# Patient Record
Sex: Female | Born: 1950
Health system: Southern US, Community
[De-identification: ages and names within clinical notes are randomized; demographics above are authoritative.]

## PROBLEM LIST (undated history)

## (undated) ENCOUNTER — Emergency Department (HOSPITAL_COMMUNITY): Admission: EM | Source: Home / Self Care

## (undated) DIAGNOSIS — I739 Peripheral vascular disease, unspecified: Secondary | ICD-10-CM

## (undated) DIAGNOSIS — K635 Polyp of colon: Secondary | ICD-10-CM

## (undated) DIAGNOSIS — F419 Anxiety disorder, unspecified: Secondary | ICD-10-CM

## (undated) DIAGNOSIS — B1920 Unspecified viral hepatitis C without hepatic coma: Secondary | ICD-10-CM

## (undated) DIAGNOSIS — E119 Type 2 diabetes mellitus without complications: Secondary | ICD-10-CM

## (undated) DIAGNOSIS — Z9889 Other specified postprocedural states: Secondary | ICD-10-CM

## (undated) DIAGNOSIS — K5790 Diverticulosis of intestine, part unspecified, without perforation or abscess without bleeding: Secondary | ICD-10-CM

## (undated) DIAGNOSIS — G479 Sleep disorder, unspecified: Secondary | ICD-10-CM

## (undated) DIAGNOSIS — J309 Allergic rhinitis, unspecified: Secondary | ICD-10-CM

## (undated) DIAGNOSIS — F1990 Other psychoactive substance use, unspecified, uncomplicated: Secondary | ICD-10-CM

## (undated) DIAGNOSIS — D509 Iron deficiency anemia, unspecified: Secondary | ICD-10-CM

## (undated) DIAGNOSIS — K219 Gastro-esophageal reflux disease without esophagitis: Secondary | ICD-10-CM

## (undated) DIAGNOSIS — I10 Essential (primary) hypertension: Secondary | ICD-10-CM

## (undated) DIAGNOSIS — Z8719 Personal history of other diseases of the digestive system: Secondary | ICD-10-CM

## (undated) DIAGNOSIS — K31819 Angiodysplasia of stomach and duodenum without bleeding: Secondary | ICD-10-CM

## (undated) DIAGNOSIS — Z8639 Personal history of other endocrine, nutritional and metabolic disease: Secondary | ICD-10-CM

## (undated) DIAGNOSIS — I639 Cerebral infarction, unspecified: Secondary | ICD-10-CM

## (undated) HISTORY — PX: EYE SURGERY: SHX253

## (undated) HISTORY — PX: TONSILLECTOMY: SUR1361

## (undated) HISTORY — PX: ABDOMINAL HYSTERECTOMY: SHX81

## (undated) HISTORY — DX: Other psychoactive substance use, unspecified, uncomplicated: F19.90

## (undated) HISTORY — DX: Angiodysplasia of stomach and duodenum without bleeding: K31.819

## (undated) HISTORY — PX: CARPAL TUNNEL RELEASE: SHX101

## (undated) HISTORY — PX: LEG AMPUTATION ABOVE KNEE: SHX117

## (undated) HISTORY — DX: Personal history of other endocrine, nutritional and metabolic disease: Z86.39

## (undated) HISTORY — DX: Other specified postprocedural states: Z98.89

## (undated) HISTORY — DX: Other specified postprocedural states: Z98.890

## (undated) HISTORY — DX: Iron deficiency anemia, unspecified: D50.9

## (undated) HISTORY — DX: Personal history of other diseases of the digestive system: Z87.19

---

## 1997-10-27 ENCOUNTER — Emergency Department (HOSPITAL_COMMUNITY): Admission: EM | Admit: 1997-10-27 | Discharge: 1997-10-27 | Payer: Self-pay | Admitting: Emergency Medicine

## 1997-12-21 ENCOUNTER — Encounter: Admission: RE | Admit: 1997-12-21 | Discharge: 1997-12-21 | Payer: Self-pay | Admitting: Obstetrics & Gynecology

## 1998-09-19 ENCOUNTER — Emergency Department (HOSPITAL_COMMUNITY): Admission: EM | Admit: 1998-09-19 | Discharge: 1998-09-19 | Payer: Self-pay | Admitting: Emergency Medicine

## 1998-09-19 ENCOUNTER — Encounter: Payer: Self-pay | Admitting: Emergency Medicine

## 1999-06-01 ENCOUNTER — Emergency Department (HOSPITAL_COMMUNITY): Admission: EM | Admit: 1999-06-01 | Discharge: 1999-06-01 | Payer: Self-pay | Admitting: Emergency Medicine

## 1999-06-01 ENCOUNTER — Encounter: Payer: Self-pay | Admitting: Emergency Medicine

## 1999-06-26 ENCOUNTER — Inpatient Hospital Stay (HOSPITAL_COMMUNITY): Admission: AD | Admit: 1999-06-26 | Discharge: 1999-06-26 | Payer: Self-pay | Admitting: Obstetrics

## 2000-03-22 ENCOUNTER — Emergency Department (HOSPITAL_COMMUNITY): Admission: EM | Admit: 2000-03-22 | Discharge: 2000-03-22 | Payer: Self-pay | Admitting: Emergency Medicine

## 2000-03-22 ENCOUNTER — Encounter: Payer: Self-pay | Admitting: Emergency Medicine

## 2000-06-04 ENCOUNTER — Encounter: Admission: RE | Admit: 2000-06-04 | Discharge: 2000-06-04 | Payer: Self-pay | Admitting: Obstetrics & Gynecology

## 2000-12-07 ENCOUNTER — Inpatient Hospital Stay (HOSPITAL_COMMUNITY): Admission: AD | Admit: 2000-12-07 | Discharge: 2000-12-07 | Payer: Self-pay | Admitting: *Deleted

## 2001-04-07 ENCOUNTER — Emergency Department (HOSPITAL_COMMUNITY): Admission: EM | Admit: 2001-04-07 | Discharge: 2001-04-07 | Payer: Self-pay | Admitting: Emergency Medicine

## 2001-05-23 ENCOUNTER — Encounter: Admission: RE | Admit: 2001-05-23 | Discharge: 2001-05-23 | Payer: Self-pay | Admitting: Obstetrics & Gynecology

## 2003-10-26 ENCOUNTER — Emergency Department (HOSPITAL_COMMUNITY): Admission: EM | Admit: 2003-10-26 | Discharge: 2003-10-26 | Payer: Self-pay

## 2004-06-20 ENCOUNTER — Emergency Department (HOSPITAL_COMMUNITY): Admission: EM | Admit: 2004-06-20 | Discharge: 2004-06-20 | Payer: Self-pay | Admitting: Emergency Medicine

## 2005-04-24 ENCOUNTER — Emergency Department (HOSPITAL_COMMUNITY): Admission: EM | Admit: 2005-04-24 | Discharge: 2005-04-24 | Payer: Self-pay | Admitting: *Deleted

## 2005-04-25 ENCOUNTER — Ambulatory Visit: Payer: Self-pay | Admitting: Internal Medicine

## 2005-04-27 ENCOUNTER — Ambulatory Visit: Payer: Self-pay | Admitting: *Deleted

## 2005-05-07 ENCOUNTER — Ambulatory Visit: Payer: Self-pay | Admitting: Internal Medicine

## 2005-06-01 ENCOUNTER — Ambulatory Visit: Payer: Self-pay | Admitting: Internal Medicine

## 2005-06-05 ENCOUNTER — Ambulatory Visit: Payer: Self-pay | Admitting: Internal Medicine

## 2005-06-07 ENCOUNTER — Ambulatory Visit: Payer: Self-pay | Admitting: Internal Medicine

## 2005-06-11 ENCOUNTER — Encounter (HOSPITAL_COMMUNITY): Admission: RE | Admit: 2005-06-11 | Discharge: 2005-09-09 | Payer: Self-pay | Admitting: Internal Medicine

## 2005-06-18 ENCOUNTER — Encounter (INDEPENDENT_AMBULATORY_CARE_PROVIDER_SITE_OTHER): Payer: Self-pay | Admitting: *Deleted

## 2005-06-18 ENCOUNTER — Ambulatory Visit (HOSPITAL_COMMUNITY): Admission: RE | Admit: 2005-06-18 | Discharge: 2005-06-18 | Payer: Self-pay | Admitting: Internal Medicine

## 2005-07-06 ENCOUNTER — Ambulatory Visit: Payer: Self-pay | Admitting: Internal Medicine

## 2005-07-09 ENCOUNTER — Ambulatory Visit: Payer: Self-pay | Admitting: Internal Medicine

## 2006-01-03 ENCOUNTER — Ambulatory Visit: Payer: Self-pay | Admitting: Internal Medicine

## 2006-01-24 ENCOUNTER — Ambulatory Visit (HOSPITAL_COMMUNITY): Admission: RE | Admit: 2006-01-24 | Discharge: 2006-01-24 | Payer: Self-pay | Admitting: Internal Medicine

## 2006-01-24 ENCOUNTER — Ambulatory Visit: Payer: Self-pay | Admitting: Internal Medicine

## 2006-01-31 ENCOUNTER — Ambulatory Visit: Payer: Self-pay | Admitting: Family Medicine

## 2006-03-07 ENCOUNTER — Ambulatory Visit: Payer: Self-pay | Admitting: Internal Medicine

## 2006-05-13 ENCOUNTER — Ambulatory Visit: Payer: Self-pay | Admitting: Internal Medicine

## 2006-06-06 ENCOUNTER — Emergency Department (HOSPITAL_COMMUNITY): Admission: EM | Admit: 2006-06-06 | Discharge: 2006-06-06 | Payer: Self-pay | Admitting: Emergency Medicine

## 2006-07-08 ENCOUNTER — Ambulatory Visit: Payer: Self-pay | Admitting: Internal Medicine

## 2006-09-02 ENCOUNTER — Ambulatory Visit: Payer: Self-pay | Admitting: Internal Medicine

## 2006-10-17 ENCOUNTER — Ambulatory Visit: Payer: Self-pay | Admitting: Internal Medicine

## 2006-11-05 ENCOUNTER — Ambulatory Visit (HOSPITAL_COMMUNITY): Admission: RE | Admit: 2006-11-05 | Discharge: 2006-11-05 | Payer: Self-pay | Admitting: Neurosurgery

## 2007-02-12 ENCOUNTER — Encounter (INDEPENDENT_AMBULATORY_CARE_PROVIDER_SITE_OTHER): Payer: Self-pay | Admitting: *Deleted

## 2007-02-13 ENCOUNTER — Ambulatory Visit: Payer: Self-pay | Admitting: Internal Medicine

## 2007-02-13 LAB — CONVERTED CEMR LAB
Alkaline Phosphatase: 67 units/L (ref 39–117)
BUN: 21 mg/dL (ref 6–23)
CO2: 22 meq/L (ref 19–32)
Creatinine, Ser: 1.06 mg/dL (ref 0.40–1.20)
Glucose, Bld: 101 mg/dL — ABNORMAL HIGH (ref 70–99)
Potassium: 3.9 meq/L (ref 3.5–5.3)
Sodium: 139 meq/L (ref 135–145)
Total Protein: 7.5 g/dL (ref 6.0–8.3)

## 2007-04-14 ENCOUNTER — Ambulatory Visit: Payer: Self-pay | Admitting: Internal Medicine

## 2007-07-31 ENCOUNTER — Ambulatory Visit: Payer: Self-pay | Admitting: Internal Medicine

## 2007-07-31 LAB — CONVERTED CEMR LAB
ALT: 30 units/L (ref 0–35)
AST: 53 units/L — ABNORMAL HIGH (ref 0–37)
Alkaline Phosphatase: 74 units/L (ref 39–117)
BUN: 12 mg/dL (ref 6–23)
Basophils Absolute: 0 10*3/uL (ref 0.0–0.1)
Basophils Relative: 0 % (ref 0–1)
CO2: 24 meq/L (ref 19–32)
Eosinophils Absolute: 0.1 10*3/uL (ref 0.0–0.7)
Eosinophils Relative: 2 % (ref 0–5)
Free T4: 1.3 ng/dL (ref 0.89–1.80)
Glucose, Bld: 106 mg/dL — ABNORMAL HIGH (ref 70–99)
HCT: 27.2 % — ABNORMAL LOW (ref 36.0–46.0)
Hemoglobin: 7.4 g/dL — CL (ref 12.0–15.0)
LDL Cholesterol: 51 mg/dL (ref 0–99)
MCV: 67.8 fL — ABNORMAL LOW (ref 78.0–100.0)
Monocytes Absolute: 0.6 10*3/uL (ref 0.1–1.0)
Monocytes Relative: 11 % (ref 3–12)
Neutro Abs: 3.4 10*3/uL (ref 1.7–7.7)
Neutrophils Relative %: 62 % (ref 43–77)
Platelets: 470 10*3/uL — ABNORMAL HIGH (ref 150–400)
Potassium: 4.3 meq/L (ref 3.5–5.3)
RBC: 4.01 M/uL (ref 3.87–5.11)
RDW: 19.1 % — ABNORMAL HIGH (ref 11.5–15.5)
Total Bilirubin: 0.5 mg/dL (ref 0.3–1.2)
Total Protein: 7.6 g/dL (ref 6.0–8.3)
VLDL: 21 mg/dL (ref 0–40)
WBC: 5.4 10*3/uL (ref 4.0–10.5)

## 2007-08-01 ENCOUNTER — Encounter: Payer: Self-pay | Admitting: Internal Medicine

## 2007-08-01 LAB — CONVERTED CEMR LAB: Saturation Ratios: 4 % — ABNORMAL LOW (ref 20–55)

## 2007-08-04 ENCOUNTER — Ambulatory Visit (HOSPITAL_COMMUNITY): Admission: RE | Admit: 2007-08-04 | Discharge: 2007-08-04 | Payer: Self-pay | Admitting: Internal Medicine

## 2007-08-21 ENCOUNTER — Ambulatory Visit: Payer: Self-pay | Admitting: Internal Medicine

## 2007-10-06 ENCOUNTER — Ambulatory Visit: Payer: Self-pay | Admitting: Internal Medicine

## 2007-10-27 ENCOUNTER — Ambulatory Visit: Payer: Self-pay | Admitting: Internal Medicine

## 2007-11-04 ENCOUNTER — Ambulatory Visit: Payer: Self-pay | Admitting: Internal Medicine

## 2007-12-11 ENCOUNTER — Ambulatory Visit: Payer: Self-pay | Admitting: Internal Medicine

## 2008-01-23 ENCOUNTER — Ambulatory Visit: Payer: Self-pay | Admitting: Internal Medicine

## 2008-01-23 LAB — CONVERTED CEMR LAB
Basophils Absolute: 0 10*3/uL (ref 0.0–0.1)
Basophils Relative: 0 % (ref 0–1)
Eosinophils Absolute: 0.1 10*3/uL (ref 0.0–0.7)
Hemoglobin: 12.8 g/dL (ref 12.0–15.0)
MCHC: 33.7 g/dL (ref 30.0–36.0)
MCV: 87.2 fL (ref 78.0–100.0)
Monocytes Absolute: 0.6 10*3/uL (ref 0.1–1.0)
Monocytes Relative: 11 % (ref 3–12)
Neutro Abs: 3.1 10*3/uL (ref 1.7–7.7)
Neutrophils Relative %: 60 % (ref 43–77)
RBC: 4.36 M/uL (ref 3.87–5.11)
RDW: 16.2 % — ABNORMAL HIGH (ref 11.5–15.5)

## 2008-03-12 ENCOUNTER — Inpatient Hospital Stay (HOSPITAL_COMMUNITY): Admission: EM | Admit: 2008-03-12 | Discharge: 2008-03-31 | Payer: Self-pay | Admitting: Emergency Medicine

## 2008-03-12 ENCOUNTER — Ambulatory Visit: Payer: Self-pay | Admitting: Pulmonary Disease

## 2008-03-12 ENCOUNTER — Encounter (INDEPENDENT_AMBULATORY_CARE_PROVIDER_SITE_OTHER): Payer: Self-pay | Admitting: General Surgery

## 2008-03-24 ENCOUNTER — Ambulatory Visit: Payer: Self-pay | Admitting: Physical Medicine & Rehabilitation

## 2008-03-31 ENCOUNTER — Inpatient Hospital Stay (HOSPITAL_COMMUNITY)
Admission: RE | Admit: 2008-03-31 | Discharge: 2008-04-09 | Payer: Self-pay | Admitting: Physical Medicine & Rehabilitation

## 2008-04-12 ENCOUNTER — Ambulatory Visit: Payer: Self-pay | Admitting: Internal Medicine

## 2008-04-27 ENCOUNTER — Encounter
Admission: RE | Admit: 2008-04-27 | Discharge: 2008-04-28 | Payer: Self-pay | Admitting: Physical Medicine & Rehabilitation

## 2008-04-28 ENCOUNTER — Encounter
Admission: RE | Admit: 2008-04-28 | Discharge: 2008-07-27 | Payer: Self-pay | Admitting: Physical Medicine & Rehabilitation

## 2008-04-28 ENCOUNTER — Ambulatory Visit: Payer: Self-pay | Admitting: Physical Medicine & Rehabilitation

## 2008-05-28 DIAGNOSIS — I639 Cerebral infarction, unspecified: Secondary | ICD-10-CM

## 2008-05-28 HISTORY — DX: Cerebral infarction, unspecified: I63.9

## 2008-06-03 ENCOUNTER — Ambulatory Visit: Payer: Self-pay | Admitting: Internal Medicine

## 2008-07-22 ENCOUNTER — Ambulatory Visit: Payer: Self-pay | Admitting: Internal Medicine

## 2008-07-22 ENCOUNTER — Encounter: Payer: Self-pay | Admitting: Internal Medicine

## 2008-07-22 DIAGNOSIS — Z9889 Other specified postprocedural states: Secondary | ICD-10-CM

## 2008-07-22 DIAGNOSIS — I10 Essential (primary) hypertension: Secondary | ICD-10-CM | POA: Insufficient documentation

## 2008-07-22 DIAGNOSIS — M726 Necrotizing fasciitis: Secondary | ICD-10-CM | POA: Insufficient documentation

## 2008-07-22 DIAGNOSIS — F322 Major depressive disorder, single episode, severe without psychotic features: Secondary | ICD-10-CM | POA: Insufficient documentation

## 2008-07-22 DIAGNOSIS — K219 Gastro-esophageal reflux disease without esophagitis: Secondary | ICD-10-CM | POA: Insufficient documentation

## 2008-07-22 HISTORY — DX: Other specified postprocedural states: Z98.89

## 2008-08-02 ENCOUNTER — Encounter
Admission: RE | Admit: 2008-08-02 | Discharge: 2008-09-14 | Payer: Self-pay | Admitting: Physical Medicine & Rehabilitation

## 2008-08-02 ENCOUNTER — Encounter
Admission: RE | Admit: 2008-08-02 | Discharge: 2008-08-02 | Payer: Self-pay | Admitting: Physical Medicine & Rehabilitation

## 2008-08-03 ENCOUNTER — Ambulatory Visit: Payer: Self-pay | Admitting: Physical Medicine & Rehabilitation

## 2008-08-03 ENCOUNTER — Encounter
Admission: RE | Admit: 2008-08-03 | Discharge: 2008-11-01 | Payer: Self-pay | Admitting: Physical Medicine & Rehabilitation

## 2008-08-19 ENCOUNTER — Encounter: Payer: Self-pay | Admitting: Internal Medicine

## 2008-08-19 ENCOUNTER — Ambulatory Visit: Payer: Self-pay | Admitting: Internal Medicine

## 2008-08-19 LAB — CONVERTED CEMR LAB
ALT: 16 units/L (ref 0–35)
AST: 27 units/L (ref 0–37)
Alkaline Phosphatase: 75 units/L (ref 39–117)
BUN: 17 mg/dL (ref 6–23)
Chloride: 105 meq/L (ref 96–112)
Creatinine, Ser: 0.87 mg/dL (ref 0.40–1.20)

## 2008-09-14 ENCOUNTER — Ambulatory Visit: Payer: Self-pay | Admitting: Physical Medicine & Rehabilitation

## 2008-09-23 ENCOUNTER — Encounter: Payer: Self-pay | Admitting: Internal Medicine

## 2008-09-23 ENCOUNTER — Ambulatory Visit: Payer: Self-pay | Admitting: Internal Medicine

## 2008-10-07 ENCOUNTER — Telehealth: Payer: Self-pay | Admitting: Internal Medicine

## 2008-11-02 ENCOUNTER — Encounter
Admission: RE | Admit: 2008-11-02 | Discharge: 2009-01-31 | Payer: Self-pay | Admitting: Physical Medicine & Rehabilitation

## 2008-11-04 ENCOUNTER — Encounter
Admission: RE | Admit: 2008-11-04 | Discharge: 2008-11-09 | Payer: Self-pay | Admitting: Physical Medicine & Rehabilitation

## 2008-11-09 ENCOUNTER — Ambulatory Visit: Payer: Self-pay | Admitting: Physical Medicine & Rehabilitation

## 2008-11-11 ENCOUNTER — Ambulatory Visit: Payer: Self-pay | Admitting: Internal Medicine

## 2008-11-11 ENCOUNTER — Encounter: Payer: Self-pay | Admitting: Internal Medicine

## 2008-11-11 LAB — CONVERTED CEMR LAB
Albumin: 3.7 g/dL (ref 3.5–5.2)
Alkaline Phosphatase: 85 units/L (ref 39–117)
BUN: 11 mg/dL (ref 6–23)
Basophils Absolute: 0 10*3/uL (ref 0.0–0.1)
Basophils Relative: 0 % (ref 0–1)
Creatinine, Ser: 0.74 mg/dL (ref 0.40–1.20)
Eosinophils Absolute: 0.1 10*3/uL (ref 0.0–0.7)
Eosinophils Relative: 2 % (ref 0–5)
Glucose, Bld: 89 mg/dL (ref 70–99)
HCT: 38.6 % (ref 36.0–46.0)
HDL: 38 mg/dL — ABNORMAL LOW (ref >39)
Hemoglobin: 13 g/dL (ref 12.0–15.0)
LDL Cholesterol: 91 mg/dL (ref 0–99)
Lymphocytes Relative: 46 % (ref 12–46)
MCHC: 33.7 g/dL (ref 30.0–36.0)
MCV: 92.3 fL (ref 78.0–100.0)
Monocytes Absolute: 0.5 10*3/uL (ref 0.1–1.0)
Platelets: 264 10*3/uL (ref 150–400)
Potassium: 3.9 meq/L (ref 3.5–5.3)
RDW: 13.9 % (ref 11.5–15.5)
Total CHOL/HDL Ratio: 4.5
Triglycerides: 205 mg/dL — ABNORMAL HIGH (ref <150)

## 2008-12-15 ENCOUNTER — Telehealth: Payer: Self-pay | Admitting: Internal Medicine

## 2008-12-23 ENCOUNTER — Encounter: Payer: Self-pay | Admitting: Internal Medicine

## 2008-12-23 ENCOUNTER — Ambulatory Visit: Payer: Self-pay | Admitting: Internal Medicine

## 2009-01-27 ENCOUNTER — Encounter
Admission: RE | Admit: 2009-01-27 | Discharge: 2009-02-01 | Payer: Self-pay | Admitting: Physical Medicine & Rehabilitation

## 2009-02-01 ENCOUNTER — Ambulatory Visit: Payer: Self-pay | Admitting: Physical Medicine & Rehabilitation

## 2009-02-03 ENCOUNTER — Encounter: Payer: Self-pay | Admitting: Internal Medicine

## 2009-02-03 ENCOUNTER — Ambulatory Visit: Payer: Self-pay | Admitting: Internal Medicine

## 2009-02-24 ENCOUNTER — Emergency Department (HOSPITAL_COMMUNITY): Admission: EM | Admit: 2009-02-24 | Discharge: 2009-02-24 | Payer: Self-pay | Admitting: Emergency Medicine

## 2009-02-28 ENCOUNTER — Ambulatory Visit: Payer: Self-pay | Admitting: Internal Medicine

## 2009-02-28 ENCOUNTER — Ambulatory Visit (HOSPITAL_COMMUNITY): Admission: RE | Admit: 2009-02-28 | Discharge: 2009-02-28 | Payer: Self-pay | Admitting: Internal Medicine

## 2009-03-14 ENCOUNTER — Telehealth: Payer: Self-pay | Admitting: Internal Medicine

## 2009-03-24 ENCOUNTER — Ambulatory Visit: Payer: Self-pay | Admitting: Internal Medicine

## 2009-03-29 ENCOUNTER — Encounter
Admission: RE | Admit: 2009-03-29 | Discharge: 2009-05-25 | Payer: Self-pay | Admitting: Physical Medicine & Rehabilitation

## 2009-03-29 ENCOUNTER — Ambulatory Visit: Payer: Self-pay | Admitting: Physical Medicine & Rehabilitation

## 2009-04-29 ENCOUNTER — Encounter: Payer: Self-pay | Admitting: Internal Medicine

## 2009-05-02 ENCOUNTER — Telehealth: Payer: Self-pay | Admitting: Internal Medicine

## 2009-05-25 ENCOUNTER — Encounter: Admission: RE | Admit: 2009-05-25 | Discharge: 2009-05-30 | Payer: Self-pay | Admitting: Orthopedic Surgery

## 2009-05-30 ENCOUNTER — Encounter: Admission: RE | Admit: 2009-05-30 | Discharge: 2009-08-28 | Payer: Self-pay | Admitting: Orthopedic Surgery

## 2009-05-31 ENCOUNTER — Ambulatory Visit: Payer: Self-pay | Admitting: Family Medicine

## 2009-06-20 ENCOUNTER — Encounter
Admission: RE | Admit: 2009-06-20 | Discharge: 2009-09-18 | Payer: Self-pay | Admitting: Physical Medicine & Rehabilitation

## 2009-06-23 ENCOUNTER — Ambulatory Visit: Payer: Self-pay | Admitting: Family Medicine

## 2009-06-23 ENCOUNTER — Ambulatory Visit: Payer: Self-pay | Admitting: Internal Medicine

## 2009-07-27 ENCOUNTER — Ambulatory Visit: Payer: Self-pay | Admitting: Physical Medicine & Rehabilitation

## 2009-08-09 ENCOUNTER — Ambulatory Visit: Payer: Self-pay | Admitting: Internal Medicine

## 2009-09-13 ENCOUNTER — Ambulatory Visit: Payer: Self-pay | Admitting: Internal Medicine

## 2009-12-13 ENCOUNTER — Ambulatory Visit: Payer: Self-pay | Admitting: Internal Medicine

## 2010-02-21 ENCOUNTER — Inpatient Hospital Stay (HOSPITAL_COMMUNITY): Admission: EM | Admit: 2010-02-21 | Discharge: 2010-02-28 | Payer: Self-pay | Admitting: Emergency Medicine

## 2010-02-21 ENCOUNTER — Ambulatory Visit: Payer: Self-pay | Admitting: Cardiology

## 2010-02-22 ENCOUNTER — Encounter (INDEPENDENT_AMBULATORY_CARE_PROVIDER_SITE_OTHER): Payer: Self-pay | Admitting: Internal Medicine

## 2010-03-06 ENCOUNTER — Ambulatory Visit: Payer: Self-pay | Admitting: Hematology & Oncology

## 2010-04-12 ENCOUNTER — Encounter (INDEPENDENT_AMBULATORY_CARE_PROVIDER_SITE_OTHER): Payer: Self-pay | Admitting: *Deleted

## 2010-04-12 LAB — CONVERTED CEMR LAB
ALT: 36 units/L — ABNORMAL HIGH (ref 0–35)
Alkaline Phosphatase: 101 units/L (ref 39–117)
Basophils Absolute: 0 10*3/uL (ref 0.0–0.1)
Eosinophils Absolute: 0.1 10*3/uL (ref 0.0–0.7)
Eosinophils Relative: 1 % (ref 0–5)
HCT: 35.3 % — ABNORMAL LOW (ref 36.0–46.0)
Lymphocytes Relative: 23 % (ref 12–46)
MCV: 91 fL (ref 78.0–100.0)
Neutrophils Relative %: 62 % (ref 43–77)
Platelets: 281 10*3/uL (ref 150–400)
RDW: 15.9 % — ABNORMAL HIGH (ref 11.5–15.5)
Sodium: 141 meq/L (ref 135–145)
Total Bilirubin: 0.3 mg/dL (ref 0.3–1.2)
Total Protein: 6.7 g/dL (ref 6.0–8.3)

## 2010-04-14 ENCOUNTER — Ambulatory Visit: Payer: Self-pay | Admitting: Oncology

## 2010-05-08 LAB — CBC & DIFF AND RETIC
Basophils Absolute: 0 10*3/uL (ref 0.0–0.1)
EOS%: 1 % (ref 0.0–7.0)
HCT: 29.6 % — ABNORMAL LOW (ref 34.8–46.6)
HGB: 9.3 g/dL — ABNORMAL LOW (ref 11.6–15.9)
MCH: 27.4 pg (ref 25.1–34.0)
MCV: 87.1 fL (ref 79.5–101.0)
NEUT%: 61.1 % (ref 38.4–76.8)
lymph#: 1.5 10*3/uL (ref 0.9–3.3)

## 2010-05-08 LAB — MORPHOLOGY

## 2010-05-08 LAB — RETICULOCYTES (CHCC): Retic Ct Pct: 3 % (ref 0.4–3.1)

## 2010-05-09 LAB — TRANSFERRIN RECEPTOR, SOLUABLE: Transferrin Receptor, Soluble: 51.8 nmol/L

## 2010-05-09 LAB — COMPREHENSIVE METABOLIC PANEL
ALT: 22 U/L (ref 0–35)
AST: 42 U/L — ABNORMAL HIGH (ref 0–37)
Alkaline Phosphatase: 92 U/L (ref 39–117)
BUN: 14 mg/dL (ref 6–23)
CO2: 27 mEq/L (ref 19–32)
Creatinine, Ser: 0.66 mg/dL (ref 0.40–1.20)
Glucose, Bld: 74 mg/dL (ref 70–99)
Sodium: 140 mEq/L (ref 135–145)

## 2010-05-09 LAB — IRON AND TIBC
%SAT: 5 % — ABNORMAL LOW (ref 20–55)
Iron: 20 ug/dL — ABNORMAL LOW (ref 42–145)
UIBC: 397 ug/dL

## 2010-05-17 ENCOUNTER — Ambulatory Visit: Payer: Self-pay | Admitting: Oncology

## 2010-06-09 LAB — CBC WITH DIFFERENTIAL/PLATELET
BASO%: 0.5 % (ref 0.0–2.0)
Basophils Absolute: 0 10*3/uL (ref 0.0–0.1)
EOS%: 1 % (ref 0.0–7.0)
Eosinophils Absolute: 0.1 10*3/uL (ref 0.0–0.5)
HCT: 39.2 % (ref 34.8–46.6)
HGB: 12.9 g/dL (ref 11.6–15.9)
LYMPH%: 23.6 % (ref 14.0–49.7)
MCH: 29.4 pg (ref 25.1–34.0)
MCHC: 32.9 g/dL (ref 31.5–36.0)
MCV: 89.3 fL (ref 79.5–101.0)
MONO#: 0.8 10*3/uL (ref 0.1–0.9)
MONO%: 13.3 % (ref 0.0–14.0)
NEUT#: 3.9 10*3/uL (ref 1.5–6.5)
NEUT%: 61.6 % (ref 38.4–76.8)
Platelets: 321 10*3/uL (ref 145–400)
RBC: 4.39 10*6/uL (ref 3.70–5.45)
RDW: 18.2 % — ABNORMAL HIGH (ref 11.2–14.5)
WBC: 6.3 10*3/uL (ref 3.9–10.3)
lymph#: 1.5 10*3/uL (ref 0.9–3.3)

## 2010-06-09 LAB — COMPREHENSIVE METABOLIC PANEL
ALT: 42 U/L — ABNORMAL HIGH (ref 0–35)
AST: 58 U/L — ABNORMAL HIGH (ref 0–37)
Albumin: 3.6 g/dL (ref 3.5–5.2)
Alkaline Phosphatase: 89 U/L (ref 39–117)
BUN: 11 mg/dL (ref 6–23)
CO2: 31 mEq/L (ref 19–32)
Calcium: 9.1 mg/dL (ref 8.4–10.5)
Chloride: 99 mEq/L (ref 96–112)
Creatinine, Ser: 0.75 mg/dL (ref 0.40–1.20)
Glucose, Bld: 91 mg/dL (ref 70–99)
Potassium: 3.9 mEq/L (ref 3.5–5.3)
Sodium: 139 mEq/L (ref 135–145)
Total Bilirubin: 0.4 mg/dL (ref 0.3–1.2)
Total Protein: 7.3 g/dL (ref 6.0–8.3)

## 2010-06-09 LAB — LACTATE DEHYDROGENASE: LDH: 135 U/L (ref 94–250)

## 2010-06-10 LAB — SEDIMENTATION RATE: Sed Rate: 4 mm/hr (ref 0–22)

## 2010-06-10 LAB — IRON AND TIBC
%SAT: 21 % (ref 20–55)
Iron: 73 ug/dL (ref 42–145)
TIBC: 351 ug/dL (ref 250–470)
UIBC: 278 ug/dL

## 2010-06-10 LAB — FERRITIN: Ferritin: 150 ng/mL (ref 10–291)

## 2010-06-10 LAB — TRANSFERRIN RECEPTOR, SOLUABLE: Transferrin Receptor, Soluble: 19.1 nmol/L

## 2010-06-20 ENCOUNTER — Encounter (INDEPENDENT_AMBULATORY_CARE_PROVIDER_SITE_OTHER): Payer: Self-pay | Admitting: *Deleted

## 2010-06-20 LAB — CONVERTED CEMR LAB
ALT: 37 units/L — ABNORMAL HIGH (ref 0–35)
AST: 59 units/L — ABNORMAL HIGH (ref 0–37)
Albumin: 4.3 g/dL (ref 3.5–5.2)
Benzodiazepines.: NEGATIVE
Methadone: NEGATIVE
Propoxyphene: NEGATIVE
Total Protein: 7.7 g/dL (ref 6.0–8.3)

## 2010-06-22 ENCOUNTER — Encounter: Payer: Self-pay | Admitting: Internal Medicine

## 2010-07-13 ENCOUNTER — Encounter (HOSPITAL_BASED_OUTPATIENT_CLINIC_OR_DEPARTMENT_OTHER): Payer: Medicare Other | Admitting: Oncology

## 2010-07-13 ENCOUNTER — Other Ambulatory Visit (HOSPITAL_COMMUNITY): Payer: Self-pay | Admitting: Oncology

## 2010-07-13 DIAGNOSIS — D500 Iron deficiency anemia secondary to blood loss (chronic): Secondary | ICD-10-CM

## 2010-07-13 LAB — CBC WITH DIFFERENTIAL/PLATELET
Eosinophils Absolute: 0.1 10*3/uL (ref 0.0–0.5)
MCV: 89.8 fL (ref 79.5–101.0)
MONO%: 15 % — ABNORMAL HIGH (ref 0.0–14.0)
NEUT#: 3.4 10*3/uL (ref 1.5–6.5)
RBC: 4.29 10*6/uL (ref 3.70–5.45)
RDW: 16.1 % — ABNORMAL HIGH (ref 11.2–14.5)
WBC: 5.9 10*3/uL (ref 3.9–10.3)

## 2010-07-13 LAB — FERRITIN: Ferritin: 92 ng/mL (ref 10–291)

## 2010-08-08 ENCOUNTER — Emergency Department (HOSPITAL_COMMUNITY): Payer: Medicare Other

## 2010-08-08 ENCOUNTER — Emergency Department (HOSPITAL_COMMUNITY)
Admission: EM | Admit: 2010-08-08 | Discharge: 2010-08-08 | Disposition: A | Discharge status: Home / Self Care | Payer: Medicare Other | Attending: Emergency Medicine | Admitting: Emergency Medicine

## 2010-08-08 DIAGNOSIS — E119 Type 2 diabetes mellitus without complications: Secondary | ICD-10-CM | POA: Insufficient documentation

## 2010-08-08 DIAGNOSIS — J189 Pneumonia, unspecified organism: Secondary | ICD-10-CM | POA: Insufficient documentation

## 2010-08-08 DIAGNOSIS — R059 Cough, unspecified: Secondary | ICD-10-CM | POA: Insufficient documentation

## 2010-08-08 DIAGNOSIS — I10 Essential (primary) hypertension: Secondary | ICD-10-CM | POA: Insufficient documentation

## 2010-08-08 DIAGNOSIS — R5381 Other malaise: Secondary | ICD-10-CM | POA: Insufficient documentation

## 2010-08-08 DIAGNOSIS — R05 Cough: Secondary | ICD-10-CM | POA: Insufficient documentation

## 2010-08-08 LAB — GLUCOSE, CAPILLARY: Glucose-Capillary: 96 mg/dL (ref 70–99)

## 2010-08-10 LAB — VITAMIN B12: Vitamin B-12: 734 pg/mL (ref 211–911)

## 2010-08-10 LAB — COMPREHENSIVE METABOLIC PANEL
ALT: 17 U/L (ref 0–35)
ALT: 20 U/L (ref 0–35)
Alkaline Phosphatase: 77 U/L (ref 39–117)
Alkaline Phosphatase: 83 U/L (ref 39–117)
CO2: 26 mEq/L (ref 19–32)
CO2: 26 mEq/L (ref 19–32)
Calcium: 8.5 mg/dL (ref 8.4–10.5)
Chloride: 105 mEq/L (ref 96–112)
GFR calc non Af Amer: >60 mL/min (ref >60)
GFR calc non Af Amer: >60 mL/min (ref >60)
Glucose, Bld: 118 mg/dL — ABNORMAL HIGH (ref 70–99)
Glucose, Bld: 86 mg/dL (ref 70–99)
Potassium: 2.9 mEq/L — ABNORMAL LOW (ref 3.5–5.1)
Potassium: 3.1 mEq/L — ABNORMAL LOW (ref 3.5–5.1)
Sodium: 138 mEq/L (ref 135–145)
Sodium: 141 mEq/L (ref 135–145)
Total Bilirubin: 0.3 mg/dL (ref 0.3–1.2)

## 2010-08-10 LAB — URINALYSIS, ROUTINE W REFLEX MICROSCOPIC
Glucose, UA: NEGATIVE mg/dL
Ketones, ur: NEGATIVE mg/dL
Nitrite: NEGATIVE
Protein, ur: NEGATIVE mg/dL

## 2010-08-10 LAB — CROSSMATCH

## 2010-08-10 LAB — CBC
HCT: 16.3 % — ABNORMAL LOW (ref 36.0–46.0)
HCT: 18.1 % — ABNORMAL LOW (ref 36.0–46.0)
HCT: 19.9 % — ABNORMAL LOW (ref 36.0–46.0)
HCT: 34.5 % — ABNORMAL LOW (ref 36.0–46.0)
Hemoglobin: 10.6 g/dL — ABNORMAL LOW (ref 12.0–15.0)
Hemoglobin: 4 g/dL — CL (ref 12.0–15.0)
Hemoglobin: 4.4 g/dL — CL (ref 12.0–15.0)
Hemoglobin: 5.3 g/dL — CL (ref 12.0–15.0)
MCH: 14.2 pg — ABNORMAL LOW (ref 26.0–34.0)
MCH: 20.9 pg — ABNORMAL LOW (ref 26.0–34.0)
MCHC: 24.3 g/dL — ABNORMAL LOW (ref 30.0–36.0)
MCHC: 24.5 g/dL — ABNORMAL LOW (ref 30.0–36.0)
MCHC: 26.6 g/dL — ABNORMAL LOW (ref 30.0–36.0)
MCHC: 27.5 g/dL — ABNORMAL LOW (ref 30.0–36.0)
MCV: 71.7 fL — ABNORMAL LOW (ref 78.0–100.0)
Platelets: 531 10*3/uL — ABNORMAL HIGH (ref 150–400)
Platelets: 568 10*3/uL — ABNORMAL HIGH (ref 150–400)
RBC: 3.1 MIL/uL — ABNORMAL LOW (ref 3.87–5.11)
RBC: 5.06 MIL/uL (ref 3.87–5.11)
RBC: 5.15 MIL/uL — ABNORMAL HIGH (ref 3.87–5.11)
RDW: 22.1 % — ABNORMAL HIGH (ref 11.5–15.5)
RDW: 24.4 % — ABNORMAL HIGH (ref 11.5–15.5)
RDW: 26 % — ABNORMAL HIGH (ref 11.5–15.5)
WBC: 6.4 10*3/uL (ref 4.0–10.5)
WBC: 7.7 10*3/uL (ref 4.0–10.5)
WBC: 8.8 10*3/uL (ref 4.0–10.5)

## 2010-08-10 LAB — CK TOTAL AND CKMB (NOT AT ARMC): CK, MB: 0.4 ng/mL (ref 0.3–4.0)

## 2010-08-10 LAB — CARDIAC PANEL(CRET KIN+CKTOT+MB+TROPI)
CK, MB: 0.5 ng/mL (ref 0.3–4.0)
Total CK: 33 U/L (ref 7–177)
Total CK: 40 U/L (ref 7–177)
Troponin I: 0.01 ng/mL (ref 0.00–0.06)

## 2010-08-10 LAB — POCT CARDIAC MARKERS: Myoglobin, poc: 22.4 ng/mL (ref 12–200)

## 2010-08-10 LAB — BASIC METABOLIC PANEL
BUN: 5 mg/dL — ABNORMAL LOW (ref 6–23)
BUN: 5 mg/dL — ABNORMAL LOW (ref 6–23)
Calcium: 9 mg/dL (ref 8.4–10.5)
Chloride: 100 mEq/L (ref 96–112)
Chloride: 99 mEq/L (ref 96–112)
Creatinine, Ser: 0.73 mg/dL (ref 0.4–1.2)
Creatinine, Ser: 0.93 mg/dL (ref 0.4–1.2)
GFR calc Af Amer: >60 mL/min (ref >60)
GFR calc Af Amer: >60 mL/min (ref >60)
GFR calc non Af Amer: >60 mL/min (ref >60)
Potassium: 4 mEq/L (ref 3.5–5.1)

## 2010-08-10 LAB — GLUCOSE, CAPILLARY
Glucose-Capillary: 100 mg/dL — ABNORMAL HIGH (ref 70–99)
Glucose-Capillary: 102 mg/dL — ABNORMAL HIGH (ref 70–99)
Glucose-Capillary: 107 mg/dL — ABNORMAL HIGH (ref 70–99)
Glucose-Capillary: 109 mg/dL — ABNORMAL HIGH (ref 70–99)
Glucose-Capillary: 109 mg/dL — ABNORMAL HIGH (ref 70–99)
Glucose-Capillary: 116 mg/dL — ABNORMAL HIGH (ref 70–99)
Glucose-Capillary: 139 mg/dL — ABNORMAL HIGH (ref 70–99)
Glucose-Capillary: 87 mg/dL (ref 70–99)
Glucose-Capillary: 89 mg/dL (ref 70–99)
Glucose-Capillary: 91 mg/dL (ref 70–99)
Glucose-Capillary: 94 mg/dL (ref 70–99)
Glucose-Capillary: 97 mg/dL (ref 70–99)
Glucose-Capillary: 97 mg/dL (ref 70–99)
Glucose-Capillary: 98 mg/dL (ref 70–99)
Glucose-Capillary: 99 mg/dL (ref 70–99)

## 2010-08-10 LAB — PROTIME-INR: INR: 1.03 (ref 0.00–1.49)

## 2010-08-10 LAB — RETICULOCYTES
RBC.: 3.16 MIL/uL — ABNORMAL LOW (ref 3.87–5.11)
Retic Count, Absolute: 34.8 10*3/uL (ref 19.0–186.0)
Retic Ct Pct: 1.1 % (ref 0.4–3.1)

## 2010-08-10 LAB — IRON AND TIBC
Iron: 26 ug/dL — ABNORMAL LOW (ref 42–135)
UIBC: >450 ug/dL

## 2010-08-10 LAB — DIFFERENTIAL
Basophils Absolute: 0 10*3/uL (ref 0.0–0.1)
Basophils Relative: 1 % (ref 0–1)
Eosinophils Absolute: 0 10*3/uL (ref 0.0–0.7)
Eosinophils Relative: 1 % (ref 0–5)
Neutrophils Relative %: 65 % (ref 43–77)

## 2010-08-10 LAB — LIPID PANEL
LDL Cholesterol: 35 mg/dL (ref 0–99)
VLDL: 18 mg/dL (ref 0–40)

## 2010-09-01 LAB — URINALYSIS, ROUTINE W REFLEX MICROSCOPIC
Bilirubin Urine: NEGATIVE
Glucose, UA: NEGATIVE mg/dL
Hgb urine dipstick: NEGATIVE
Ketones, ur: NEGATIVE mg/dL
Protein, ur: NEGATIVE mg/dL
pH: 5.5 (ref 5.0–8.0)

## 2010-09-01 LAB — DIFFERENTIAL
Basophils Absolute: 0 10*3/uL (ref 0.0–0.1)
Eosinophils Relative: 3 % (ref 0–5)
Lymphocytes Relative: 42 % (ref 12–46)
Lymphs Abs: 1.9 10*3/uL (ref 0.7–4.0)
Monocytes Absolute: 0.7 10*3/uL (ref 0.1–1.0)
Neutro Abs: 1.8 10*3/uL (ref 1.7–7.7)

## 2010-09-01 LAB — CBC
HCT: 40.2 % (ref 36.0–46.0)
Hemoglobin: 13.6 g/dL (ref 12.0–15.0)
WBC: 4.5 10*3/uL (ref 4.0–10.5)

## 2010-09-01 LAB — URINE MICROSCOPIC-ADD ON

## 2010-09-01 LAB — GLUCOSE, CAPILLARY: Glucose-Capillary: 82 mg/dL (ref 70–99)

## 2010-09-01 LAB — POCT I-STAT, CHEM 8
BUN: 11 mg/dL (ref 6–23)
Calcium, Ion: 1.18 mmol/L (ref 1.12–1.32)
Chloride: 101 mEq/L (ref 96–112)
HCT: 43 % (ref 36.0–46.0)
Potassium: 4 mEq/L (ref 3.5–5.1)

## 2010-09-11 ENCOUNTER — Other Ambulatory Visit (HOSPITAL_COMMUNITY): Payer: Self-pay | Admitting: Oncology

## 2010-09-11 ENCOUNTER — Encounter (HOSPITAL_BASED_OUTPATIENT_CLINIC_OR_DEPARTMENT_OTHER): Payer: Medicare Other | Admitting: Oncology

## 2010-09-11 DIAGNOSIS — D509 Iron deficiency anemia, unspecified: Secondary | ICD-10-CM

## 2010-09-11 LAB — CBC WITH DIFFERENTIAL/PLATELET
Basophils Absolute: 0 10*3/uL (ref 0.0–0.1)
Eosinophils Absolute: 0.1 10*3/uL (ref 0.0–0.5)
HCT: 35 % (ref 34.8–46.6)
HGB: 11.6 g/dL (ref 11.6–15.9)
LYMPH%: 29.2 % (ref 14.0–49.7)
MCHC: 33.1 g/dL (ref 31.5–36.0)
MONO#: 0.8 10*3/uL (ref 0.1–0.9)
NEUT%: 55.4 % (ref 38.4–76.8)
Platelets: 343 10*3/uL (ref 145–400)
WBC: 5.9 10*3/uL (ref 3.9–10.3)
lymph#: 1.7 10*3/uL (ref 0.9–3.3)

## 2010-09-14 ENCOUNTER — Encounter (HOSPITAL_BASED_OUTPATIENT_CLINIC_OR_DEPARTMENT_OTHER): Payer: Medicare Other | Admitting: Oncology

## 2010-09-14 DIAGNOSIS — D509 Iron deficiency anemia, unspecified: Secondary | ICD-10-CM

## 2010-09-21 ENCOUNTER — Encounter (HOSPITAL_BASED_OUTPATIENT_CLINIC_OR_DEPARTMENT_OTHER): Payer: Medicare Other | Admitting: Oncology

## 2010-09-21 DIAGNOSIS — D500 Iron deficiency anemia secondary to blood loss (chronic): Secondary | ICD-10-CM

## 2010-10-10 NOTE — Discharge Summary (Signed)
NAMEMarland Kitchen  [name], [name] NO.:  [id number]   MEDICAL RECORD NO.:  [id number]          PATIENT TYPE:  IPS   LOCATION:  4030                         FACILITY:  [facility]   PHYSICIAN:  Ellwood Dense, M.D.   DATE OF BIRTH:  [birth year]   DATE OF ADMISSION:  03/31/2008  DATE OF DISCHARGE:  04/09/2008                               DISCHARGE SUMMARY   DISCHARGE DIAGNOSES:  1. Necrotizing fasciitis requiring left above knee amputation on      October 27.  2. Glucose intolerance.  3. Hypertension.  4. Left lower lobe pneumonia treated.  5. Clostridium difficile colitis.  6. Hypokalemia.  7. Elevated anxiety level with adjustment reaction.   HISTORY OF PRESENT ILLNESS:  [name] is a 60 year old female with  history of hypertension, bunionectomy occurring once who was admitted  October 16 with pain and blisters left lower extremity secondary to  necrotizing fasciitis and sepsis.  She underwent emergent I and D with  left below knee amputation same day by Dr. Janee [name].  Postop was managed  on when due to need for repeat surgery.  She was placed on IV  antibiotic.  Repeat I and D was done on October 17.  Dr. Noelle [name] and Dr.  Carola [name] were consulted on input regarding wound closure and recommended an  above knee amputation with __________ for healing of wound.  The patient  underwent left above knee amputation on October 27 by Dr. Carola [name].  Postop  on subcu Lovenox for DVT prophylaxis and Coumadin was added  additionally.  She has required multiple units packed red blood cells  for acute blood loss anemia.  Has been maintained on IV Zosyn for  question of left lower lobe pneumonia.  Chest x-ray done today shows  partial resolution of left basal pleural parenchymal density.  Therapies  are ongoing and the patient is noted to have improvement in mobility,  does tend to be easily distracted requiring extra time and redirection.  Rehab consulted for further therapies.   PAST MEDICAL  HISTORY:  Significant for hepatitis C, hysterectomy,  hypertension and questionable history of diabetes.   ALLERGIES:  Are to PENICILLIN, CODEINE, is able to tolerate Zosyn.   FAMILY HISTORY:  Is positive for coronary artery disease.   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  Smokes 1/2  pack per day tobacco.  Uses alcohol rarely.   FUNCTIONAL HISTORY:  The patient was independent prior to admission.  Does have an nephew who lives with her who does her driving for her.   PHYSICAL EXAM:  GENERAL:  The patient is an obese female, alert and  oriented x3, verbose, needs some redirection.  HEENT:  Atraumatic, normocephalic with extraocular movements intact.  Nares patent.  Oral mucosa moist with multiple missing teeth.  NECK:  Neck is supple without masses or JVD.  LUNGS:  Lungs are clear to auscultation bilaterally.  HEART:  Shows regular rate and rhythm.  ABDOMEN:  Is soft, nontender with positive bowel sounds.  EXTREMITIES:  Show left above knee amputation stump with multiple large  fluctuant blisters.  Staples intact with moderate serosanguineous  drainage on dressing anterior aspect of thigh with large abraded  appearing area probably graft area.  Bilateral upper extremity and right  lower extremity without any breakdowns.  NEUROLOGICAL:  Alert and oriented x3.  Follow basic commands without  difficulty.  Judgment, memory intact.  She is verbose.   HOSPITAL COURSE:  [name] was admitted to rehab on March 31, 2008, for inpatient therapies to consist of PT, OT at least 3 hours 5  days a week.  Past admission, physiatry, 24 hour rehab RN and therapy  team have worked together to provide customized collaborative  interdisciplinary care during her stay.  Team conferences were held on  weekly basis to assess the patient's progress goal as well as discuss  barriers to discharge.  The  patient was noted to have high levels of  anxiety with easy distractibility as well as variable mood that was  initially noted to be as a barrier to discharge.  She has a supportive  family that will be able to assist post discharge.  During her stay in  rehab routine check labs were done with close monitoring of  electrolytes.  Labs of November 5 revealed H and H at 12.6 and 37.0,  white count normal at 8.5, platelets 780.  Check of lytes showed sodium  140, potassium 3.6, chloride 108, CO2 24, BUN 6, creatinine 0.95,  glucose 109.  The patient's pain was managed with Neurontin 100 mg  t.i.d. and Lyrica 50 mg b.i.d.  Vicodin has been used on p.r.n. basis  for pain management.  Initially due to excessive drainage dressing  changes were done on q.i.d. basis.  _________ and zinc sulfate were  added b.i.d. to promote wound healing.  The patient's wound has been  monitored along daily by rehab RN with dressing changes.  By time of  discharge drainage is almost resolved with occasional bloody drainage  from lower aspect of wound.  Currently her blisters have resolved.  Wound remained intact with sutures in place on amputation site.  Lower  graft site shows some granulation tissue.  Home health nurse has been  arranged for daily dressing changes past discharge.  The patient's blood  pressures have been monitored on b.i.d. basis.  This was noted to be  elevated.  Therefore, the patient's Prinivil was resumed.  At time of  discharge blood pressures continued to ranged from 140s to 90s systolic.  She is to follow up with LMD for further adjustments of her meds.  The  patient was noted to have issues with constipation and was started on  senna as past admission.  She was noted to have diarrhea on November 7  which interrupted her participation in therapy for a couple of days.  The patient's laxatives were deceased and stool was checked for C.  difficile on November 7 and this was negative.  She was  noted to have  issues with some loose stools.  Labs of November 9 showed hypokalemia  with potassium at 3.0.  White count was stable at 9.5.  Another stool  specimen was sent for check of C. difficile and this was noted to be  positive.  She was started on Flagyl q.i.d. basis as well as Florastor  for recolonization of her bowel on November 11.  Of note, the patient  was maintained on Avelox to complete her antibiotic  course through  November 10.  Therapies have been ongoing and the patient has had  occasional refusal of therapies due to variability of her mood as well  as pain and anxiety issues.  Dr. Jeanie Sewer was consulted for input.  He  felt the patient with anxiety disorder with insomnia as well as anxiety  symptoms due to decreasing of function post amputation.  He recommended  using trazodone to help her sleep wake cycle with outpatient psych as  needed.   At time of admission the patient was noted to be at supervision level to  min assist for ADLs.  She required cues for lateral leans with in  performing her bathing.  The patient was at close supervision for  stand/pivot transfers without assisted device.  She was able to ambulate  25 feet with rolling walker with supervision.  Initially she could not  attempt one curb due to anxiety issues.  PT has been working with the  patient in bilateral upper extremity strengthening exercises to help  increase range of motion as well as functional mobility.  The patient  has progressed along to being at modified independent level for  transfers, modified independent level for navigating wheelchair.  She is  able to ambulate up to 30 feet with supervision.  Family education was  set up for car transfers and for navigation of one step.  However, the  patient declined this as she felt her niece who worked for the group  home was familiar with these techniques and she refused family aid.  OT  has been working with the patient for self-care.   The patient has  progressed along to being at modified independent level for stand/pivot  transfers to bedside commode.  She is modified independent for bathing  and dressing.  Further followup home health therapies have been set up  to include home health PT, OT by [facility].  Home health RN has  been arranged for dressing changes.  Also of note, the patient's  hypokalemia was supplemented briefly with check of lytes on November 13  that showed continued hypokalemia with potassium at 3.1.  The patient is  discharged to home on potassium supplements b.i.d. basis with next check  of labs to be done on Monday, April 12, 2008, by primary care or by  home health nurse.  On April 09, 2008, the patient is discharged to  home.   DISCHARGE MEDICATIONS:  1. Lyrica 100 mg p.o. b.i.d.  2. Multivitamin one p.o. per day.  3. Prinivil 20 mg a day.  4. Protonix 40 mg a day.  5. Vitamin C 500 mg b.i.d.  6. Zinc sulfate 220 mg b.i.d.  7. Norco 5/325 one to two q.4-6 hours p.r.n. pain #75 Rx.  8. Flagyl 500 mg p.o. q.6 hours.  9. K-Dur 20 mEq b.i.d.  10.Trazodone 50 mg half p.o. q.h.s.   DIET:  Is low carb.   WOUND CARE:  Is daily dressing changes with petroleum jelly to graft  site and dry dressing with Ace wrap additionally.   ACTIVITY:  Level is intermittent with supervision.  No strenuous  activity.  No alcohol, no smoking, no driving.   SPECIAL INSTRUCTIONS:  [facility] to provide PT, OT and RN, use  acidophilus yogurt b.i.d. to t.i.d. basis.   FOLLOW UP:  The patient to follow up with Dr. Philipp Deputy Monday, November  16 at 2:30, follow up with Dr. Thomasena Edis prosthetic clinic December 2 at  10 a.m.  Follow up with Dr. Carola [name] for postop check and staple removal  November 23.      Greg Cutter, P.A.    ______________________________  Ellwood Dense, M.D.    PP/MEDQ  D:  04/09/2008  T:  04/09/2008  Job:  [id number]   cc:   Tresa Endo L. Philipp Deputy, M.D.  Doralee Albino.  [name], M.D.

## 2010-10-10 NOTE — H&P (Signed)
NAMEMarland [name]  HANI, PATNODE NO.:  [id number]   MEDICAL RECORD NO.:  [id number]          PATIENT TYPE:  IPS   LOCATION:  4030                         FACILITY:  [facility]   PHYSICIAN:  Ellwood Dense, M.D.   DATE OF BIRTH:  12-28-1950   DATE OF ADMISSION:  03/31/2008  DATE OF DISCHARGE:                              HISTORY & PHYSICAL   PRIMARY CARE PHYSICIAN:  [facility]opedist Dr. Carola Frost, and Dr.  Shelle Iron and Hospitalist Critical Care Medicine.   HISTORY OF THE PRESENT ILLNESS:  [name] is a 60 year old African  American female with history of hypertension and prior bunionectomy  January 26, 2008.   The patient was admitted March 12, 2008 with pain and blisters on her  left lower extremity secondary to necrotizing fasciitis and sepsis.  She  underwent emergent I and D along with a left below-knee amputation the  same day, performed by Dr. Janee Morn.  Postoperatively she was managed on  a ventilator as there was a distinct possibility for repeat surgery  being required.  She underwent treatment with IV antibiotics and had a  repeat I and D done March 13, 2008.   Dr. Noelle Penner and Dr. Carola Frost evaluated the patient for ongoing surgery.  They felt that she would need an above-knee amputation with muscle flap  and split thickness skin graft and that was done March 23, 2008 by Dr.  Carola Frost.  The patient experienced acute blood loss anemia postoperatively  and required multiple units of packed red blood cells.  She was placed  on subcutaneous Lovenox for DVT prophylaxis and Coumadin was added today  March 31, 2008.  The patient has been treated with IV Zosyn through  March 30, 2008 for questionable left lower lobe pneumonia.  The Zosyn  has been discontinued and no other antibiotics have been started.  The  patient shows improving mobility and tends to be easily distracted.   Chest x-ray March 31, 2008 has shown partial resolution of left basal  parenchymal  density.   The patient was evaluated by the rehabilitation physicians and felt to  be an appropriate candidate for inpatient rehabilitation.   REVIEW OF SYSTEMS:  Positive for wound healing of the left above-knee  amputation site.   PAST MEDICAL HISTORY:  1. Diabetes mellitus.  2. Hepatitis C.  3. Prior hysterectomy.  4. Hypertension.   FAMILY HISTORY:  Positive for coronary artery disease.   SOCIAL HISTORY:  The patient is single and lives in a one level home  which is actually her parents and she lives there with her nephew who is  disabled from gout and arthritis.  The home that she is in presently has  two steps to enter and she plans to move into her own apartment which is  handicapped accessible and that is planned for few days after discharge  from this hospitalization.  The patient smokes a half pack of cigarettes  per day and reports rare alcohol usage.  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   FUNCTIONAL HISTORY PRIOR ADMISSION:  Independent and questionable  driving.   ALLERGIES:  PENICILLIN,  CODEINE AND QUESTIONABLE TOLERANCE OF ZOSYN.   MEDICATIONS PRIOR TO ADMISSION:  1. Lisinopril.  2. Protonix.  3. Hydrochlorothiazide.  4. Tramadol.  5. Neurontin.   LABORATORY:  Recent hemoglobin was 11.9 with hematocrit of 35.3,  platelet count of 756,000 and white count of 8.1.  Hemoglobin A1c was  normal at 5.5.  Hepatitis B surface and core antibody and antigen were  negative.  Hepatitis panel showed Hep C antibody reactive and Hep A  antibody nonreactive.  Recent urine culture grew 85,000 Streptococcus.  Recent sodium is 134, potassium 3.2, chloride 96, bicarbonate 29, BUN  14, creatinine 1.05 and glucose 136.  Recent total bili was 1.8 with AST  of 49, ALT of 32 and albumin of 2.9.   PHYSICAL EXAM:  Reasonably well-appearing, alert, cooperative female  seated up on the edge of the bed with a dry dressing on her left above-  knee amputation site.  Blood pressure is  142/86 with pulse of 97,  respiratory rate 18 and temperature 98.6, CBGs have been 90-120.  HEENT: Normocephalic, nontraumatic.  CARDIOVASCULAR:  Regular rate and rhythm, S1-S2 without murmurs.  ABDOMEN:  Soft, obese, nontender with positive bowel sounds.  LUNGS:  Clear to auscultation bilaterally.  NEUROLOGIC:  Alert and oriented x3, cranial nerves II-XII are intact.   Left above-knee amputation site showed multiple large fluctuant blisters  with staples intact with moderate serosanguineous drainage on the  dressing.  Inferior aspect of the thigh showed a large abraded-appearing  area which was possibly a graft site.  The right lower extremity exam  showed skin intact with strength of 4 over 5 throughout.   DIAGNOSIS:  1. Status post necrotizing fasciitis of the left lower extremity with      subsequent left below-knee and then left above-knee amputation      March 23, 2008 with rotational flap and split thickness skin      grafting.  2. Type 2 diabetes mellitus, diet controlled.  3. Hypertension, presently off medication.  4. Left lower lobe pneumonia, recently on intravenous antibiotics with      planned start of p.o. antibiotics, specifically Avelox.   Presently the patient will be admitted to receive collaborative and  interdisciplinary care between the physiatrist, rehab nursing staff and  therapy team, 3 hours per day, at least 5 days per week.  The patient's  level of medical complexity and substantial therapy needs in context of  that medical necessity cannot be provided at a lesser intensity of care.  1. Physiatrist will provide 24-hour management of medical needs as      well as oversight of the therapy, plan/treatment, and provide      guidance as appropriate regarding the interaction of the two.  2. Twenty-four hour rehab nursing will assist in management of bowel      and bladder and help integrate therapy concepts, techniques and      education.  3. Physical  therapy will assess and treat for range of motion,      strengthening, bed mobility, transfers, pre gait training, gait      training and equipment eval.  4. Occupational therapy will assess and treat for range of motion,      strengthening, ADLs, cognitive/perceptional training, splinting and      equipment eval.  5. Case management social worker will assess and treat for      psychosocial issues and discharge planning as appropriate.  Team      conferences will be held weekly  to establish goals, assess progress      and determine barriers to discharge.   ESTIMATED LENGTH OF STAY:  Seven to fifteen days.   GOALS:  Modified independent to standby assist ADLs, transfers and  ambulation short distances with wheelchair mobility longer distances.   PROGNOSIS:  Good.           ______________________________  Ellwood Dense, M.D.     DC/MEDQ  D:  03/31/2008  T:  03/31/2008  Job:  [id number]

## 2010-10-10 NOTE — Assessment & Plan Note (Signed)
[name] is back regarding her left above-knee amputation.  Pain is 6/10  level, although she states it is better than it was previously.  She had  been ambulating and working with therapy and doing extremely well.  Neurontin was beneficial as was desensitization exercises with therapy.  She is able to control pain with less Vicodin.  Over the last week or  two, she has been using only 3 a day as opposed to 6 at times.  Her  prosthesis seems to be functioning well.  She is very enthusiastic about  therapy and loves to go every week.  She has been getting out of the  house for going to church, etc.  Slowly she is getting her strength  back.   Sleep is fair.  Pain worsens with standing and prolonged activities at  times.  Sometimes pain is worse at night.   REVIEW OF SYSTEMS:  Notable for night sweats, easy bleeding.  Other  pertinent positives are well.  Full review is in the written health and  history section in the chart.   SOCIAL HISTORY:  The patient is widowed and lives alone currently.   Physical exam, the patient is pleasant, oriented x3.  Affect is bright  and appropriate.  Left leg was clean and well healed.  She had excellent  use of her stance control on knee.  She has gotten the hang of this and  the more she walked it betters, she is able to bend the knee.  No frank  allodynia.  Discoloration of the skin today.  Cognitively, she is very  appropriate with good mood, orientation, etc.  Heart is regular rate.  Chest is clear.  Abdomen is soft and nontender.   ASSESSMENT:  1. Left above-knee amputation.  2. Hypertension.  3. Stump and phantom pain.   PLAN:  1. Continue Neurontin 300 mg t.i.d.  2. Flexeril for spasm.  3. Vicodin for breakthrough pain 1-2 q.8 h. p.r.n., #105 were written      today.  I encouraged her to use less and less as time goes on.  As      she walks more and desensitization, there should not be problem      over the next few months' time.  4.  Continue outpatient gait therapy.  5. I will see her back in 3 months.      Ranelle Oyster, M.D.  Electronically Signed     ZTS/MedQ  D:  09/14/2008 13:16:08  T:  09/15/2008 02:32:47  Job #:  [id number]

## 2010-10-10 NOTE — Assessment & Plan Note (Signed)
[name] returns to the clinic today for followup evaluation.  She  is a 60 year old African American female with history of hypertension  and recent bunionectomy.  She developed lower extremity pain and  blisters with necrotizing fasciitis and sepsis.  She underwent I&D with  Dr. Janee Morn and then subsequently a left below-knee amputation.  She  was treated with repeated I&Ds and subsequently underwent a left above-  knee amputation on March 23, 2008 by Dr. Carola Frost.  She was stabilized  and moved to the Rehabilitation Unit on March 31, 2008 and remained  through until discharge on April 09, 2008.   The patient has been at her home with her nephew.  She is planning to  move into an independent handicapped-accessible apartment.  She is up on  a walker and hopping on a right leg regularly throughout the today.  She  is independent for bathing and dressing.  She did not have home health  wound dressing changes for her left leg, but they have stopped for an  unknown reason.  She does need to follow up with them.  She has stopped  the Lyrica at this point as the prescription was outdated, but she does  need to contact [facility] to be restarted on the Lyrica.  She does  complain of burning and stabbing, which is worse in her left lower  extremity, off Lyrica compared to being on the Lyrica.  She reports that  her pain is at best a 5-6/10 and at worse an 8-9/10.  She does use Norco  and gets benefit from that medication.   MEDICATIONS:  1. Lyrica 100 mg b.i.d. (not using at present).  2. Multivitamin daily.  3. Prinivil 20 mg daily.  4. Protonix 40 mg daily.  5. Vitamin C 500 mg b.i.d.  6. Zinc 220 mg b.i.d.  7. Norco 5/325 one to two tablets every 4-6 hours p.r.n.   REVIEW OF SYSTEMS:  Positive for night sweats and easy bleeding along  with limb swelling, coughing, and shortness of breath.   PHYSICAL EXAMINATION:  GENERAL:  Well-appearing, middle-aged adult  female in  mild-to-no acute discomfort.  VITAL SIGNS:  Blood pressure 158/99 with the pulse of 104, respiratory  rate 18, and O2 saturation 95% on room air.  EXTREMITIES:  She has 4+/5 strength throughout the bilateral upper  extremities.  Lower extremity exam showed 4+/5 strength throughout.  She  does have a slowly healing wound, especially posteriorly in her left  thigh.  She does get up and ambulates using a regular walker, hopping on  her right leg.  Examination of the right lower extremity shows dry skin  with the skin intact.   DIAGNOSES:  1. Status post left above-knee amputation for necrotizing fasciitis.  2. Chronic glucose intolerance.  3. Hypertension.  4. Left lower lobe pneumonia.  5. Clostridium difficile colitis.  6. Hypokalemia.  7. Anxiety disorder.   In the office today, we did refill the patient's Norco.  She will be  seeing [name] at [facility] and they will be fitting her  shrinkers, which she will be using for 2-4 weeks before she is casted  for her liner and definitive prosthesis.  She will then start outpatient  therapy.  We will plan on seeing her in followup in the Pain Clinic for  ongoing pain issues in approximately 2 months' time.  We will be  refilling her Norco for her and we will get home  health agency to continue treatment for  her left leg.  She continues to  be followed by Dr. Carola Frost in the Outpatient Clinic.  We will plan on  seeing the patient in followup in approximately 2 months' time.           ______________________________  Ellwood Dense, M.D.     DC/MedQ  D:  04/28/2008 11:38:06  T:  04/29/2008 00:56:42  Job #:  [id number]

## 2010-10-10 NOTE — H&P (Signed)
NAMELARKEN, URIAS               ACCOUNT NO.:  [id number]   MEDICAL RECORD NO.:  [id number]          PATIENT TYPE:  INP   LOCATION:  2308                         FACILITY:  [facility]   PHYSICIAN:  Jene Every, M.D.    DATE OF BIRTH:  [birth year]   DATE OF ADMISSION:  03/12/2008  DATE OF DISCHARGE:                              HISTORY & PHYSICAL   CHIEF COMPLAINT:  Right lower extremity necrotizing fasciitis.   HISTORY:  This is a 60 year old female who presented to the emergency  room with a 1-week history of low-grade fevers, extreme pain, and  swelling in the leg.  The patient was 6 weeks status post a foot  surgery.  She was seen in the emergency room and was consulted by Dr.  Violeta Gelinas, general surgeon, who evaluated the patient and directly  diagnosed her with necrotizing fasciitis that was life threatening.  The  patient was clinically septic.  She was taken emergently to the  operating room.  I was consulted for intraoperative assistance as an  aggressive debridement of the compartments of the leg was anticipated by  Dr. Janee Morn.  I evaluated the patient in the operating room under  general anesthesia.   PHYSICAL EXAMINATION:  EXTREMITIES:  On presentation, revealed a  partially amputated leg.  Dr. Janee Morn had already debrided multiple  areas of skin necrosis  both on the thighs as well as on the distal leg  and in the anterior aspect of the foot that were and nonviable.  The  patient had significant soft tissue swelling of the foot subcutaneous  and bullae consistent with necrotizing fasciitis.  Examination of the  compartments of the leg revealed significant  necrosis as well as  extensive edema in  multiple fascial planes, particularly of the  posterior and the anterior compartments.  The level of amputation was  chosen.  There was significant nonviable tissue distal to that, below  the tibial tubercle, approximately 5 cm.   I had extensive discussion with Dr.  Janee Morn concerning the patient's  condition.  She was obviously septic, hypotensive, on fluid  resuscitation.  The involved tissue extended to the operative site of  the toe.  The entire posterior portion of the distal calf, the skin was  nonviable.  The fascial planes of the compartments were loose.  There  was significant, discolored, muscle necrosis that did not have  contractility.  There were portions, however, of the distal anterior  components, which had good bloody tissue to it, however.   Given the obvious presence of necrotizing fascia and mild necrosis and  the patient's septic condition, I agreed with Dr. Janee Morn that an  emergent life-saving, below-knee amputation was indicated.  At that  point, Dr. Janee Morn proceeded with my assistance and performed the  osteotomies of the tibia, fibula, and the standard below-knee amputation  guillotine type.  The patient had a fair amount of blood loss due to  avascularity at least at the amputation site, and no evidence of  significant peripheral arterial disease.   Throughout the time of the operation, the stat Gram stain ordered was  not available.  There was an unexpected laboratory failure.  After the  amputation was performed, a hemostatic sterile  dressing was applied to  the stump.  The patient was noted to have intraoperative anemia, was  given multiple units of blood as well as fluid resuscitation  and was then transported directly to the intensive care unit to be  admitted by the intensive care physicians and to be monitored  postoperatively.  It was anticipated she will require return to the  operating room on multiple occasions and may require revision of the  amputation.      Jene Every, M.D.  Electronically Signed     JB/MEDQ  D:  03/12/2008  T:  03/13/2008  Job:  [id number]

## 2010-10-10 NOTE — Assessment & Plan Note (Signed)
[name] is back regarding her left above-knee amputation and chronic pain  related to this.  She rates her pain 5/10.  She has been using Norco  5/325 for breakthrough pain usually about 5 times a day.  She rates her  pain 5/10.  Oswestry score of 40%.  She has pain in the left leg and  hamstring/wound area, but also has phantom pain.  She states the phantom  pain is probably worse than the leg pain itself.  The patient describes  pain as constant tingling.  She is really not using anything else except  for the Vicodin at this point.  She had been on Lyrica at one point.  She is getting prosthetic education per Oleh Genin at the outpatient  rehab center.  Pain interferes with general activity, relations with  others, enjoyment of life on a moderate level.  Sleep is fair.   REVIEW OF SYSTEMS:  Notable for occasional bladder problems, confusion,  constipation, weight gain, night sweats.  Other pertinent positives are  as above.  Full review is in the written health and history section of  the chart.   SOCIAL HISTORY:  The patient is widowed and living alone currently.   PHYSICAL EXAM:  VITAL SIGNS:  Blood pressure is 136/83, pulse 84,  respiratory rate is 16.  She is sating 98% on room air.  GENERAL:  The patient is pleasant, alert and oriented x3.  Affect is  only bright and appropriate.  She is in a wheelchair today.  We examined  her left leg which is mildly tender to palpation.  She has a lot of pain  over some scarred areas over the posterior medial thigh.  No frank  allodynia was appreciated.  Wound was clean and intact.  The area was  slightly swollen.  She was wearing her silicone liner for compressive  purposes and to get accustomed to the feel.  The patient otherwise is  alert and appropriate.  She had good insight and awareness.  HEART:  Regular.  CHEST:  Clear.  ABDOMEN:  Soft, nontender.  She had intact neuromuscular function in  both upper extremities with good strength in  fact.   ASSESSMENT:  1. Status post left above-knee amputation.  2. Hypertension.  3. Chronic stump and phantom pain.   PLAN:  1. We will begin trial of Neurontin starting at 300 mg at bedtime and      titrating up to t.i.d. over 2 weeks' time.  2. We will add Skelaxin p.r.n. for spasm, 800 mg q.6 h. p.r.n.  3. Vicodin for breakthrough pain 1-2 q.6 h. p.r.n.  I want to wean      this down and hopefully, with the Neurontin adjustment we can.  4. Continue outpatient therapies for gait and desensitization.  We      discussed about massage and desensitization activities at home.      She is to continue wearing her shrinker and silicone liner as much      as possible.  We talked about massage and other modalities as well.  5. I will see her back in about 6 weeks' time.  I think she will do      very well here in the long run.      Ranelle Oyster, M.D.  Electronically Signed     ZTS/MedQ  D:  08/03/2008 20:34:14  T:  08/04/2008 04:39:35  Job #:  [id number]

## 2010-10-10 NOTE — Op Note (Signed)
NAMENOHEMI, NICKLAUS               ACCOUNT NO.:  [id number]   MEDICAL RECORD NO.:  [id number]          PATIENT TYPE:  INP   LOCATION:  5159                         FACILITY:  [facility]   PHYSICIAN:  Doralee Albino. Carola Frost, M.D. DATE OF BIRTH:  August 14, 1950   DATE OF PROCEDURE:  03/23/2008  DATE OF DISCHARGE:                               OPERATIVE REPORT   PREOPERATIVE DIAGNOSES:  1. Necrotizing fasciitis, left leg status post guillotine high below-      knee amputation.  2. Large thigh ulcers 7 x 4 laterally and 10 x 15 posteriorly.   POSTOPERATIVE DIAGNOSES:  1. Necrotizing fasciitis, left leg status post guillotine high below-      knee amputation.  2. Large thigh ulcers 7 x 4 laterally and 10 x 15 posteriorly.   PROCEDURES:  1. Above-knee amputation.  2. Rotational subcutaneous flap coverage lateral ulcer.  3. Split-thickness skin grafting 10 x 15 cm posterior thigh.   SURGEON:  Doralee Albino. Carola Frost, MD   ASSISTANT:  Mearl Latin, PA-C   ANESTHESIA:  General.   COMPLICATIONS:  None.   TOURNIQUET:  2 hours and 35 minutes.   DISPOSITION:  To PACU.   CONDITION:  Stable.   BRIEF SUMMARY AND INDICATION OF PROCEDURE:  Liala Codispoti has undergone  BKA for necrotizing fasciitis, but it has no soft tissue coverage and  inadequate bone length for functional BKA activity.  She also has  underlying diabetes and is not an optimal candidate for a free flap,  which again would likely provide little function and great risk.  We  discussed using the patella to heal to the distal end of the femur to  attain an end-bearing prosthesis, which perhaps we will reduce the knee  for frictional fit of her prosthesis on her jeopardized skin.  The  patient wished to proceed with this option.  She understood the risk  preoperatively include recurrent infection; new infection; need for  further surgery; failure of the flap, graft, or the patellofemoral  fusion; and need for further procedures among others.   After full  discussion, she wished to proceed.   BRIEF SUMMARY OF PROCEDURE:  Ms. Carico received a dose of Zosyn  immediately prior to her surgery.  General anesthesia was induced.  Tourniquet was placed.  Left lower extremity was prepped and draped in  the usual sterile fashion.  Incision was marked out such that we could  perform a rotational flap of skin, subcutaneous tissue into the large  lateral ulcer.  A medial fishmouth incision was also made.  The  posterior structures were separated from the posterior femur and then  the knife used to remove the bone.  Retractors were placed and distal  femoral cut made such that the patella could be folded over onto the  distal femur.  We did protect the adductor insertions as well as some of  the lateral musculature as well.  Freehand cut was made at the patella  and smoothed.  We then irrigated thoroughly, removed all the bone dust  to reduce the chance of heterotopic ossification.  The popliteal artery  and vein were identified, separated and ligated with stick ties of 2-0  and 0 Prolene.  The sciatic nerve was identified, sutured, injected  along the sheath with Marcaine and epi and then positioned, cut well  proximal to the amputation.  The medial soft tissues were then revised  to allow for primary closure.  There was no way to rotate into the large  posterior skin defect.  Consequently, the posterior skin was cut away,  was then put on the back table, and skin graft taken from the amputated  stump and skin.  Meanwhile, we irrigated and performed layered closure  using #1 PDS for the deep patellar layer as well as 0 and 2-0 PDS.  A  small longitudinal incision was made directly over the patella, as I did  not wish to tunnel underneath the subcutaneous envelope and strip any of  the soft tissues off the patella or elevate such that we could  jeopardize blood supply, and through this separate incision, we placed 2  screws,  countersinking them in the patella, and securing purchase in the  femoral shaft to obtain compression.  We did get excellent purchase with  both screws and a nice flush fit of the patella.  We confirmed this with  AP and lateral fluoro views.  Rotating the flap into the defect that did  take some time laterally and was technically demanding and even more so  was application of the posterior split-thickness skin graft from a  completely free donor piece off of the amputated skin pedicle.  After  this area was covered, Mepitel was placed over top as well as Adaptic  and a wound VAC sponge.  We also placed a wound VAC over the other  incisions to reduce edema drainage and maceration and promote healing.  This took not only Mellody Dance Paul's assistance throughout the procedure with  mobilization and protection of the structures and also for holding the  stump during the grafting and flap coverage, but also a second assistant  as well which fortunately was available.  Sterile dressing and Ace wrap  were applied.  The patient was awakened from anesthesia and transported  to the PACU in stable condition.   PROGNOSIS:  Ms. Dugue has had a infection requiring above-knee  amputation to maximize her retention of function.  Her split-thickness  skin grafting site is still high risk area and will require continued  surveillance and perhaps another trip to the OR.  We did decide to go  ahead and graft despite the technical difficulty because there was no  morbidity associated with harvest from the amputated skin pedicle.  This  did require some extra tourniquet time as well in order to obtain an  adequate seal on the VAC without excessive bleeding, and she will be  monitored for her response after this, but given the procedure, I would  not expect any significant manifestations.  She remains under medical  management with the General Surgery Service.  We anticipate leaving the  VAC in place for 3 days  then beginning dressing changes.      Doralee Albino. Carola Frost, M.D.  Electronically Signed     MHH/MEDQ  D:  03/23/2008  T:  03/24/2008  Job:  [id number]

## 2010-10-10 NOTE — Consult Note (Signed)
NAMEMarland Kitchen  [name], [name] NO.:  [id number]   MEDICAL RECORD NO.:  [id number]          PATIENT TYPE:  IPS   LOCATION:  4030                         FACILITY:  [facility]   PHYSICIAN:  Antonietta Breach, M.D.  DATE OF BIRTH:  May 28, 1951   DATE OF CONSULTATION:  04/08/2008  DATE OF DISCHARGE:  04/09/2008                                 CONSULTATION   REQUESTING PHYSICIAN:  Ellwood Dense, M.D.   REASON FOR CONSULTATION:  Mood swings and anxiety.   HISTORY OF PRESENT ILLNESS:  [name] is a 60 year old female  admitted to the [facility] on March 31, 2008, with a left above  knee amputation.  She is requiring rehabilitation and is on the [facility]  rehabilitation floor.   She has been having severe anxious swings for approximately 2 weeks and  they have become more severe and they involve feeling on edge, excessive  muscle tension and at times she becomes irritable with them.  She states  that these feelings are exacerbated when she defecates in her bed.  She  realizes that she is not used to having to be dependent on other people.   Her current treatment has not been associated with an improvement in  these episodes.  Both she and the staff are concerned about her  disposition and her lack of emotional internal comfort.   The patient is not having any hallucinations or delusions.  Her memory  and orientation function are intact.   The patient also describes insomnia.  She has had some phantom pain, the  greatest stress of the experience of amputation was in October when she  was working through thoughts about why she had to go through such a  catastrophic loss of function.   PAST PSYCHIATRIC HISTORY:  No history of major depression.  No history  of increased energy or decreased need for sleep.  No hallucinations.  She has never had any mental health care other than having Xanax 0.125  mg b.i.d. p.r.n. on her current MAR.   FAMILY PSYCHIATRIC HISTORY:  None  known.   In review of the past medical record there is no listing of mental  problems.   SOCIAL HISTORY:  Single, lives with nephew, retired Financial risk analyst.  Alcohol use  rare.  No illegal drugs.   PAST MEDICAL HISTORY:  Diabetes mellitus, hepatitis C, history of  hysterectomy, hypertension.  Status post left above knee amputation for  necrotizing fasciitis and secondary sepsis.   MEDICATIONS:  1. [facility] is reviewed.  [name] is on Neurontin 100 mg t.i.d.  2. Xanax 0.125 mg b.i.d. p.r.n.   ALLERGIES:  PENICILLIN and CODEINE.   LABORATORY DATA:  Sodium 130, BUN 7, creatinine 0.74, glucose 124,  hemoglobin A1c unremarkable.  WBC 10.5, hemoglobin 12.5, platelet count  637, SGOT 31, SGPT 18, INR 1.0.   REVIEW OF SYSTEMS:  Constitutional, head, eyes, ears, nose, throat,  mouth, neurologic, psychiatric, cardiovascular, respiratory,  gastrointestinal, genitourinary, skin, musculoskeletal, hematologic,  lymphatic, endocrine and metabolic all unremarkable.   PHYSICAL EXAMINATION:  VITAL SIGNS:  Temperature 98.5, pulse 93,  respiratory rate 18,  blood pressure 145/97, O2 saturation on room air  99%.  GENERAL APPEARANCE:  [name] is a middle-aged female partially  reclined in a supine position in her hospital bed with no abnormal  involuntary movements.  MENTAL STATUS EXAM:  [name] is alert.  She has good eye contact.  Her attention span is slightly decreased.  Concentration is slightly  decreased.  Affect is anxious.  Mood is anxious.  She is oriented to all  spheres.  Her memory is intact to immediate, recent and remote.  Her  speech involves normal rate and prosody without dysarthria.  Thought  process is logical, coherent, goal-directed.  No looseness of  associations.  Thought content - no thoughts of harming herself, no  thoughts of harming others, no delusions, no hallucinations.  Insight is  intact regarding the processing that she is going through in dealing  with her  amputation.   She notes that she has had a lifelong style of being independent.  She  always preferred to take care of others during holiday seasons.  She  always preferred to be the cook of the family gathering and is concerned  that she will not be able to cook in the coming holidays for example.  She does realize that her current position where she has to be dependent  upon others is very awkward for her.  Her judgment is intact.   ASSESSMENT:  AXIS I:  293.84 anxiety disorder not otherwise specified  (idiopathic and general medical factors), rule out generalized anxiety  disorder.  She is having insomnia and she is having some severe anxiety  reactions to her loss of function.  AXIS II:  Deferred.  AXIS III:  See past medical history.  AXIS IV:  General medical.  AXIS V:  55.   [name] is not at risk to harm herself or others.  She does agree  to call emergency services immediately for any thoughts of harming  herself, thoughts of harming others or other distress.   The undersigned provided ego supportive psychotherapy and education.   The indications, alternatives and adverse effects of trazodone were  discussed with the patient for anti-insomnia as well as anti-anxiety.   The patient understands the above information and she wants to start  trazodone.  She agrees to not drive if drowsy once she is rehabilitated.   RECOMMENDATIONS:  Would start trazodone at 25 mg p.o. q.h.s. and then  would repeat in 1 hour if insomnia is still present.   Would then increase the trazodone by 25 mg per day as needed depending  upon the previous night's sleep pattern until the sleep is normal, not  to exceed however 200 mg q.h.s.   Would monitor for drowsiness or nausea as side effects of the trazodone.   Would check a TSH.   Outpatient psychiatric followup as available at one of the clinics  attached to [facility], [facility] or [facility].      Antonietta Breach,  M.D.  Electronically Signed     JW/MEDQ  D:  04/09/2008  T:  04/09/2008  Job:  [id number]

## 2010-10-10 NOTE — Op Note (Signed)
[name], [name]               ACCOUNT NO.:  [id number]   MEDICAL RECORD NO.:  [id number]          PATIENT TYPE:  INP   LOCATION:  2308                         FACILITY:  [facility]   PHYSICIAN:  Gabrielle Dare. Janee Morn, M.D.DATE OF BIRTH:  09/18/1950   DATE OF PROCEDURE:  03/13/2008  DATE OF DISCHARGE:                               OPERATIVE REPORT   PREOPERATIVE DIAGNOSES:  1. Necrotizing fasciitis.  2. Status post guillotine below-knee amputation on the left and      incision and drainage of left thigh.   POSTOPERATIVE DIAGNOSES:  1. Necrotizing fasciitis.  2. Status post guillotine below-knee amputation on the left and      incision and drainage of left thigh.   PROCEDURES:  1. Placement of left subclavian vein triple-lumen catheter.  2. Incision and drainage, left lower extremity and left below-knee      stump.   SURGEON:  Gabrielle Dare. Janee Morn, MD   ANESTHESIA:  General.   HISTORY OF PRESENT ILLNESS:  [name] is a 60 year old female that  presented with necrotizing fasciitis 6 weeks status post bunionectomy of  her left second toe.  She underwent emergent guillotine BKA and I and D  of her left lower extremity yesterday by myself.  We are returning to  the operating room for reevaluation of tissues as well as placement of  an additional central line, as she remains critically ill.   PROCEDURE IN DETAIL:  Informed consent was obtained from the patient's  son.  She was brought straight to the operating room from the intensive  care unit on ventilator.  General anesthesia was administered by the  anesthesia staff.  First attention was directed to placement of the  line.  Her left clavicle and neck were prepped and draped in a sterile  fashion.  The left subclavian vein was entered with 1 stick without  difficulty.  Guidewire was threaded, and using Seldinger technique,  triple-lumen catheter was inserted without difficulty.  All 3 ports  flushed easily.  It was sutured in  place and sterile dressing was  applied.  Next, attention was directed to the left lower extremity.  The  dressings were removed revealing viable-appearing tissue.  The left  lower extremity as it remains was prepped and draped in a sterile  fashion.  Copious irrigation was used first of the stump and washing  away all blood clots.  All the remaining muscle was viable, which is  excellent news.  The hemostasis was obtained with Bovie cautery.  Next,  a modified rapid deployment hemostatic bandage was applied to a cyst and  we continued hemostasis.  The medial portion of the thigh was then  inspected.  The area of subcutaneous incision and drainage was cleaned  away of some old blood clots.  All the tissue was viable there.  Hemostasis was ensured.  Next, similarly the lateral part of the left  thigh was cleaned, that wound also remained clean and viable.  New wet-  to-dry dressing was placed.  Then, we  replaced dressings with 4 x 4s over the MRDH portion of the stump  as  well as the wet-to-dry portion of the medial and lateral thigh followed  by more 4 x 4s, ABDs, Kerlix, and a wide Ac.  The patient tolerated the  procedure well without apparent complication, was returned to the  intensive care unit in stable but critical condition.      Gabrielle Dare Janee Morn, M.D.  Electronically Signed     BET/MEDQ  D:  03/13/2008  T:  03/14/2008  Job:  [id number]   cc:   Alvan Dame, D.P.M.  Felipa Evener, MD

## 2010-10-10 NOTE — Consult Note (Signed)
NAMEJULLIA, MULLIGAN               ACCOUNT NO.:  [id number]   MEDICAL RECORD NO.:  [id number]          PATIENT TYPE:  INP   LOCATION:  2308                         FACILITY:  [facility]   PHYSICIAN:  Doralee Albino. Carola Frost, M.D. DATE OF BIRTH:  20-Jul-1950   DATE OF CONSULTATION:  DATE OF DISCHARGE:                                 CONSULTATION   REQUESTING PHYSICIAN:  Angelia Mould. Derrell Lolling, MD   REASON FOR CONSULTATION:  Request left below-knee amputation, status  post necrotizing fasciitis.   BRIEF SUMMARY AND HISTORY:  Ms. Mccullar is a 60 year old female status  post a bunion procedure complicated by wound infection and necrotizing  fasciitis.  This required multiple procedures including emergent  guillotine BKA with extensive distal all muscle necrosis.  She has also  undergone several thigh debridements and has been septic and intubated.  The patient's other medical history notable for hypertension,  osteoarthritis, hepatitis C, which was biopsy proven but the patient  denies this apparently.   PAST SURGICAL HISTORY:  Hysterectomy and again foot surgery about 6  weeks ago.   FAMILY HISTORY:  Noncontributory.   SOCIAL HISTORY:  The patient has remote tobacco use.   DRUG ALLERGIES:  PENICILLIN and CODEINE.   MEDICATIONS:  Lisinopril, hydrochlorothiazide, and narcotics for pain  control.   On admission, review of systems noted included in the chart.  The  patient has received multiple blood products including packed cells and  FFP.  She is currently on both vancomycin and Zosyn, sliding-scale  insulin, and subcu heparin for DVT prophylaxis.   Laboratory studies were reviewed and included in the chart.   X-RAYS:  AP and lateral left knee films demonstrate only 8 cm of bone  below the knee with no soft tissue shadow consistent with complete  removal of the leg muscle compartments at that level.   ASSESSMENT:  Left below-knee amputation and multiple thigh wounds,  status post  debridement for necrotizing fasciitis.   PLAN:  Dr. Noelle Penner from Plastic Surgery is also evaluating the patient.  Possible alternatives for closure include free flap coverage of the  stump, which would have the benefit of preserving length but with the  additional morbidity associated with the donor site as well as increased  risk for a persistent recurrent infection.  The alternative to this  would be a revision to an above-knee amputation more removed from zone  of infection, which could be done such the stump is in bearing by  retaining the patella.  Given the complete loss of musculature below the  knee functionally the distal AKA versus the high BKA would be  essentially the same.  Because of the reduced risk of the AKA then I  would recommend conversion to that.  With respect to the open wounds on  the thigh perhaps a period of a wound VAC may be additionally helpful to  obtain closure or to prepare the bed in case of split-thickness skin  grafting.  Last, I would go ahead and repeat her hepatitis serologies to  determine whether or not she has a hepatitis C infection.  I will  discuss these findings with the General Surgery Service as well as Dr.  Noelle Penner and make further arrangements accordingly.      Doralee Albino. Carola Frost, M.D.  Electronically Signed     MHH/MEDQ  D:  03/17/2008  T:  03/17/2008  Job:  [id number]

## 2010-10-10 NOTE — H&P (Signed)
[name]               ACCOUNT NO.:  [id number]   MEDICAL RECORD NO.:  [id number]          PATIENT TYPE:  INP   LOCATION:  2308                         FACILITY:  [facility]   PHYSICIAN:  Gabrielle Dare. Janee Morn, M.D.DATE OF BIRTH:  06/17/1950   DATE OF ADMISSION:  03/12/2008  DATE OF DISCHARGE:                              HISTORY & PHYSICAL   PRIMARY CARE PHYSICIAN:  [facility].   PODIATRIC/ORTHOPEDIC:  Alvan Dame, DPM   CHIEF COMPLAINT:  Pain and blisters in the left leg.   HISTORY OF PRESENT ILLNESS:  [name] is a 57-year female patient  who recently underwent some sort of toe procedure by Dr. Ralene Cork on  January 26, 2008.  Since that time about a week later, the patient did  follow up in the office complaining of continued pain and she was placed  in a walking boot and given Demerol and Phenergan.  She presented to the  ER this afternoon after developing severe pain and swelling in the leg  in the night.  She also had some heat, redness, and skin color changes  as well as severe blistering to the same leg.  This has had rapid  progression from a tiny small area on the back of her calf that has  progressed all the way up the calf up to the thigh and into the anterior  medial thigh as well as a tiny area now beginning on the right medial  calf of the size of a dime.  The emergency room physician was concerned  regarding a possible necrotizing infection, so General Surgery Service  was called.  Plain x-rays have been done that showed no subcu gas or no  fractures.  Her white count is normal, but surgical consultation has  been requested.   REVIEW OF SYSTEMS:  As per the history of present illness.  Again, the  patient states these symptoms onset very quickly with rapid progression.  The only thing unusual she saw in regards to her symptoms was some ants  in her kitchen that she killed and she may have been bitten by an ant,  although she does not recall being bitten  and it is unclear.  Based on  her physical symptoms, you cannot tell if she has been bitten at this  point.  Otherwise, no new medications.  No new changes and any kind of  soaps or detergents, etc.   PAST MEDICAL HISTORY:  1. Hypertension.  2. Osteoarthritis.  3. There was a documented history of hepatitis C confirmed on biopsy,      but the patient denies.   PAST SURGICAL HISTORY:  1. Hysterectomy.  2. Bilateral tubal ligation prior to that.   FAMILY HISTORY:  Noncontributory.   SOCIAL HISTORY:  Remote tobacco.  The patient's sister is with her  today.   DRUG ALLERGIES:  PENICILLIN and CODEINE.   HOME MEDICATIONS:  Lisinopril, hydrochlorothiazide, and recently started  on Demerol and Phenergan.   PHYSICAL EXAMINATION:  GENERAL:  Pleasant, alert female patient.  When  looking at her from the waist up, appears nontoxic, but she is somewhat  irritable, and when you look at her lower extremity, she appears  otherwise toxic.  VITAL SIGNS: Temp, initial temperature was 100.8 in triage, it is now  down to 100; BP is 117/81; pulse 122; respirations 18.  HEENT:  Head is normocephalic.  Sclera noninjected.  NECK:  Supple.  No adenopathy.  CHEST:  Bilateral lung sounds are clear to auscultation.  Respiratory  effort is effort is nonlabored.  CARDIOVASCULAR:  Heart sounds are S1 and S2.  No rubs, murmurs, or  gallops.  No JVD.  Peripheral edema is noted on the left lower  extremity, unilateral only.  She was a quite tachycardiac upon  presentation with a heart rate of 133.  ABDOMEN:  Soft, nontender, nondistended without hepatosplenomegaly,  masses, or bruits or hernia.  EXTREMITIES:  Asymmetrical in appearance with marked left lower  extremity edema.  The hands and the feet are somewhat cool to touch,  unable to appreciate if the patient has a palpable distal pulses.  There  appear to be thready distal pulses in the right lower extremity.  There  are weak pulses in radial and the  upper extremities.  SKIN:  The patient has diffuse edema and a left lower extremity  involving the foot extending up to the mid thigh.  On the posterior calf  involving the skin, there are multiple red purplish blotchy areas that  look necrotic in appearance with large bolus with clear fluid overlying.  The edema on the leg is 3+.  In addition to the areas on the posterior  calf and posterior thigh, she has another large blotchy area of similar  appearance on the left anterolateral thigh without bullous vesicle  formation.  She has another tiny area similar to this involving the  right medial calf and no blistering there.  The left lower extremity is  exquisitely tender and pain is out of upper portion to what one would  expect with basic edema and mild cellulitis.  There was a diffuse also  erythematous appearance in the entire left lower extremity from the toes  to the knee.  NEURO:  The patient's cranial nerves II through XII are grossly intact.  She was moving all extremities x4, albeit the left leg is being guarded  because of severe pain, but sensation is intact in the left lower  extremity.  PSYCH:  The patient is alert and oriented x3.  Her affect is essentially  appropriate to current situation.  She is in significant pain and this  probably explains her irritability.   LABORATORY AND X-RAY:  White count is 6100, hemoglobin 13, platelets  303,000.  Blood cultures, CMP, and lactic acid are pending.  X-ray of  the lower extremity and tib-fib shows no fracture.  No subcu gas.   IMPRESSION:  Suspected necrotizing cellulitis of the left lower  extremity with associated pain and edema, possible underlying  vasculitis.   PLAN:  1. Admit the patient to ICU.  2. Operating room staff, Dr. Janee Morn evaluated the patient and feels      at this point we need to take the patient to the operating room for      I and D of this area.  3. Treat symptoms such as pain with Dilaudid.  4. The  patient has pen allergic normally with at least to Zosyn.  In      this case, we will give a dose of vancomycin in the OR and adjust      antibiotics therapy thereafter.  [name], N.P.      Gabrielle Dare Janee Morn, M.D.  Electronically Signed    ALE/MEDQ  D:  03/12/2008  T:  03/13/2008  Job:  [id number]   cc:   Alvan Dame, D.P.M.

## 2010-10-10 NOTE — Discharge Summary (Signed)
[name], [name]               ACCOUNT NO.:  [id number]   MEDICAL RECORD NO.:  [id number]          PATIENT TYPE:  INP   LOCATION:  5159                         FACILITY:  [facility]   PHYSICIAN:  Doralee Albino. Carola Frost, M.D. DATE OF BIRTH:  06/09/1950   DATE OF ADMISSION:  03/12/2008  DATE OF DISCHARGE:  03/31/2008                               DISCHARGE SUMMARY   DISCHARGE DIAGNOSES:  1. Status post left above-knee amputation, end-bearing secondary to      necrotizing fasciitis of left leg.  2. Acute blood loss anemia, now stable.   PROCEDURES PERFORMED:  1. Guillotine below-knee amputation with irrigation and debridement of      left thigh on March 13, 2008, by Dr. Violeta Gelinas.  2. Left above-knee amputation, end-bearing on March 23, 2008, with      #2 rotational subcutaneous flap coverage of left thigh ulcer.  3. Split-thickness skin grafting 10 x 15 cm posterior thigh again on      March 23, 2008, by Dr. Myrene Galas.   ADDITIONAL DISCHARGE DIAGNOSES:  1. Hypertension.  2. Osteoarthritis.  3. Hepatitis C, which was confirmed on laboratory evaluation this      visit through a repeat hepatitis panel as well as confirmed with      the HCV-IgA.   BRIEF HISTORY AND HOSPITAL COURSE:  [name] is a 60 year old  African American female that was admitted to the hospital on March 12, 2008, with necrotizing fasciitis.  By report from the patient, she had  some type of foot procedure, which was complicated by a wound infection  and subsequent necrotizing fasciitis of the left leg.  She presented to  the hospital fairly septic with concerning of presentation, which  warranted a emergent surgical intervention as it was felt that this may  have been life threatening.  The patient was brought to the operating  room by Dr. Janee Morn on the late hours of March 12, 2008, after  thorough evaluation in the operating room with irrigation and  debridement of necrotic muscle and other  soft tissue.  It was determined  that the patient would benefit from an emergent guillotine below-knee  amputation of her left leg to stabilize the situation.  As such, the  left below-knee guillotine amputation was performed.  [name] was  then brought to the ICU after surgery for continued monitoring.  Central  lines were also placed.  [name] was also started on Zosyn IV for  scheduled antibiotic coverage.  Due to the significant soft tissue loss  as well as amputation, Dr. Carola Frost in the Orthopedic Trauma Service was  consulted on March 17, 2008, for assessment and possible definitive  management.  We did work the patient up and I was determine that she  would best benefit from the left above-knee amputation, which would be  end-bearing given the lack of soft tissue distally to the knee.  Ms.  Gomez had approximately less than 8 cm of her tibial left with a very  little soft tissue to permit a satisfactory coverage over the remaining  tibia.  As noted  above, [name] was then brought back to the  operating room for a second procedure on March 23, 2008, where she  underwent definitive amputation, which included left above-knee  amputation with preservation of the patella to create end-bearing  amputation.  In addition, we treated the soft tissue defects with 1  rotational subcutaneous flap coverage of the lateral thigh ulcer as well  as split-thickening skin grafting over the posterior thigh.  After  surgery, [name] went to the PACU for recovery from anesthesia and  was then transferred to back up to 5100 for continued observation and  pain control.  After surgery, [name] did remain in the hospital  for about 8 days.  In the interim, she was evaluated by inpatient rehab,  which determined that she would benefit from inpatient rehabilitation  prior to being discharged to home.  [name] was also transitioned  from heparin to Lovenox for DVT prophylaxis.   Prior to discharge to  inpatient rehab, we initiated Coumadin therapy for some long-term DVT  prophylaxis until [name] is more comfortable mobilizing.   [name] also continued to have IV antibiotics for coverage until  postoperative day #7.  The antibiotics were discontinued on the thought  being that the above-knee amputation would have eliminated possible  sources of further necrotizing fascitis, as well as the patient  demonstrated stable laboratory and clinical findings to warrant  discontinuation.  [name] progressed extraordinarily well after her  second procedure.  She did experience some acute blood loss anemia and  was transfused on 2 separate occasions, which she tolerated very well.  The last transfusion was conducted on March 27, 2008, when the patient  demonstrated and H and H of 7.7 and 22.7 respectively.  After the  transfusion, she had an H and H of 11.1 and 33.2 on March 28, 2008.  From that point forward, she did demonstrate stable H and H on the  following days.  On the day of discharge, March 31, 2008, her H and H  were 11.9 and 35.3.  [name] again did extraordinarily well since  her second procedure.  Her pain was adequately controlled, and she did  not have any additional issues.  [name] worked well with physical  therapy during her stay and progressed markedly well.   Clinical encounter note for postoperative day #8, March 31, 2008.   SUBJECTIVE AND OBJECTIVE:  The patient is doing fantastic and has no  complaints.  She has good p.o. intake.  Positive bowel movement.  Positive voiding without any urinary symptoms.  No chest pain.  No  shortness of breath.  No cough with production.  No nausea, vomiting, or  diarrhea.  No fevers.  No chills.  Pain has markedly improved.   Hemoglobin 11.9 and hematocrit 35.3.  Chest x-ray with marked  improvement in the left lower lobe density/opacity.  Generally, the  patient is doing great,  no acute distress and having a great attitude,  and is eager to get the therapy.   VITAL SIGNS:  Temperature 98.6, heart rate 97, respirations 18, at 100%  on room air, and BP 142/86.  LUNGS:  Clear to auscultation bilaterally.  CARDIAC:  Regular rate and rhythm.  ABDOMEN:  Soft, nontender, and nondistended with positive bowel sounds.  LEFT LOWER EXTREMITIES:  Split-thickness skin grafting.  Left posterior  thigh looks excellent without evidence of infection.  No foul odor is  noted.  No purulence is appreciated.  Split-thickness  skin grafting on  the left posterior thigh appears to be taking well.  Decreased size in  blisters and no signs of any significant drainage.  The patient is  healing well.   ASSESSMENT AND PLAN:  A 60 year old female status post left above-knee  amputation secondary to necrotizing fascitis, doing well.  1. Discharge to inpatient rehab today.  2. Discontinue central line and intravenous fluids.  3. Lovenox and Coumadin for deep vein thrombosis prophylaxis until      Coumadin becomes therapeutic and discontinue Lovenox.  4. Resolution of left lower lobe density.  5. Daily dressing changes while in inpatient rehab.  Dressing changes      should consist of Adaptic, 4 x 4 gauze, Kerlix, and an Ace wrap.      They may also reconsult [facility] to have a stump      shrinker fitted for the patient.  6. Acute blood loss anemia, stable.  7. The patient will follow up at the office upon discharge from      inpatient rehab.   DISCHARGE MEDICATIONS:  1. Gabapentin 100 mg p.o. t.i.d.  2. Lyrica 50 mg p.o. b.i.d.  3. Robaxin 500 mg 1-2 p.o. q.6 h. as needed for spasm.  4. Norco 5/325 one to two p.o. q.4-6 h. as needed for pain.  5. Oxycodone 5 mg 1-2 p.o. q.3 h. between Norco doses for breakthrough      pain.  6. The patient can also be on multivitamin 1 p.o. daily as well.  7. Colace 100 mg p.o. b.i.d.   HOME MEDICATIONS:  Upon review of home medication  sheet, it does appear  that the patient is on lisinopril and hydrochlorothiazide.  However,  there are no dosages noted.  The patient should also resume these  medications as well.   DISCHARGE INSTRUCTIONS:  Ms. Salazar with a significant condition to  her left lower extremity that required above-knee amputation to maximize  the retention of function.  This is rather a life-altering experience,  and she does appear to be handling this well.  She will continue to need  support from all facets to help her function as best as she possibly can  and as well to help her through this difficult time.  We need to  maintain a vigilant watch over the split-thickness skin graft site as it  continues to be a high risk area for infection.  However, at this point  in time, it does appear to be healing very nicely and it does appear  that the grafting site is taking.  Ms. Shropshire will be discharged to  the inpatient rehab center where she should engage in regular physical  therapy, occupational therapy, and other therapeutic modalities that are  being appropriate to help to restore that mount of function given her  left above-knee amputation.  We have already consulted [facility].  They should see her on the floor and they will continue to  follow for fitting of the prosthesis and any other necessary materials.  With respect to DVT prophylaxis, she should also continue with Coumadin  therapy for long-term prophylaxis until she is comfortable mobilizing  with her amputation.  Again, Ms. Zacarias should engage in daily  dressing changes to maintain a close watch over the split-thickness skin  grafting site.  She should also use Ace wrap or a compression wrapping  to help with the residual edema in her left side.  Ms. Belko will  return to our office  in approximately 5 days after being discharged home  from inpatient rehab.  Again, Ms. Lemar has no restrictions with  regards to activity  and can engage in activity as she can tolerate.      Mearl Latin, PA      Doralee Albino. Carola Frost, M.D.  Electronically Signed    KWP/MEDQ  D:  03/31/2008  T:  04/01/2008  Job:  [id number]   cc:   Gabrielle Dare. Janee Morn, M.D.  Angelia Mould. Derrell Lolling, M.D.

## 2010-10-10 NOTE — Op Note (Signed)
NAMECHAMARI, CUTBIRTH               ACCOUNT NO.:  [id number]   MEDICAL RECORD NO.:  [id number]          PATIENT TYPE:  AMB   LOCATION:  SDS                          FACILITY:  [facility]   PHYSICIAN:  Hilda Lias, M.D.   DATE OF BIRTH:  1950-12-25   DATE OF PROCEDURE:  11/05/2006  DATE OF DISCHARGE:                               OPERATIVE REPORT   PREOPERATIVE DIAGNOSIS:  Right carpal tunnel syndrome.   POSTOPERATIVE DIAGNOSES:  Right carpal tunnel syndrome.   PROCEDURE:  Decompression of the right median nerve.   ANESTHESIA:  Local plus IV sedation.   CLINICAL HISTORY:  The patient is being taken to surgery because of pain  in her hands bilaterally, the right worse than left.  Nerve conduction  tests were highly positive for bilateral carpal tunnel syndrome.  The  patient wanted to proceed with surgery and the surgery was explained to  her in my office including the possibility of no improvement, infection,  weakness.   DESCRIPTION OF PROCEDURE:  The patient was taken to the OR and the right  hand and arm was cleaned with DuraPrep.  Drapes were applied.  Then,  infiltration along the base the thumb was made with Xylocaine.  An  incision through the skin volar and through a thick carpal ligament was  done.  The nerve was found to be flat and yellowish.  Decompression  medial and laterally as well as proximal along the ulnar nerve was  achieved.  At the end, we had a good decompression.  The patient was  able to move all five fingers. From then on, the area was irrigated,  hemostasis was done with bipolar.  The wound was closed with nylon.           ______________________________  Hilda Lias, M.D.     EB/MEDQ  D:  11/05/2006  T:  11/05/2006  Job:  [id number]

## 2010-10-10 NOTE — Assessment & Plan Note (Signed)
[name] is back regarding a left above-knee amputation.  Her pain remains  6/10.  She has some phantom pain.  Apparently, her Neurontin ran out and  pharmacy told her she had no further refills then she did not call back  for refills.  She uses the Norco for breakthrough pain usually 1 in the  morning, 2 afternoon, 2 at night.  He has pain in the distal stump  particularly in the left lateral aspect.  When questioned, she did  report perhaps a bit of not there that was painful.  She is having  issues with the fit of her socket due to her leg shrinking and will need  a new socket sometime in the near future.  Pain interferes with general  activity, relations with others, enjoyment of life on a moderate level.  Sleep result in a problem as well as nighttime hours due to her pain.   REVIEW OF SYSTEMS:  Notable for spasms, some weight loss, decreased  appetite at times.  Other pertinent positives are above and full review  is in the written health and history section of the chart.   SOCIAL HISTORY:  The patient lives alone.  She has been involved in  [place] with her leg.  She is completed her physical therapy by the way.   PHYSICAL EXAMINATION:  VITAL SIGNS:  Blood pressure is 113/63, pulse 77,  respiratory rate 18.  She is sating 98% on room air.  GENERAL:  The patient is pleasant, alert, and oriented x3.  EXTREMITIES:  She walks with fair knee and movement and foot swelling.  She has really done well mastering balance.  She uses a walker for  balance in the office today, but can walk with her cane.  She has an  area which slightly is painful to touch and sclerotic over the left  lateral thigh at the distal aspect.  The wound is intact.  Right lower  extremity skin is within normal limits.  She has no frank sensory loss  in the right leg or hand today.  HEART:  Regular rate.  CHEST:  Clear.  ABDOMEN:  Soft, nontender.  NEURO:  Cognitively, she is appropriate.   ASSESSMENT:  1. Left  above-knee amputation.  2. Hypertension.  3. Stump and phantom pain.  The patient may have a neuroma in the left      residual limb.   PLAN:  1. I recommended continued leg wear as well as massage of left distal      limb.  If needed we can consider injection of the area suspected      for neuroma.  2. I refilled Neurontin 300 mg t.i.d.  3. The patient will remain on Flexeril as needed for spasm.  4. We discussed the goal of reducing her Norco to 1-2 a day over the      next several months.  We discussed particular plan of taper today.      It will be impaired if that she is using her leg, however along the      way.  5. I will see her back in about 3 months' time.      Ranelle Oyster, M.D.  Electronically Signed     ZTS/MedQ  D:  11/09/2008 11:56:01  T:  11/10/2008 01:29:17  Job #:  [id number]

## 2010-10-10 NOTE — Op Note (Signed)
[name], [name]               ACCOUNT NO.:  [id number]   MEDICAL RECORD NO.:  [id number]          PATIENT TYPE:  INP   LOCATION:  2550                         FACILITY:  [facility]   PHYSICIAN:  Gabrielle Dare. Janee Morn, M.D.DATE OF BIRTH:  Jun 22, 1950   DATE OF PROCEDURE:  03/12/2008  DATE OF DISCHARGE:                               OPERATIVE REPORT   PREOPERATIVE DIAGNOSIS:  Necrotizing soft tissue infection, left lower  extremity.   POSTOPERATIVE DIAGNOSES:  1. Necrotizing soft tissue infection, left lower extremity.  2. Necrotizing fasciitis, left lower extremity.   PROCEDURE:  1. Incision and drainage, left lower extremity.  2. Guillotine below-knee amputation.   SURGEON:  Gabrielle Dare. Janee Morn, MD   ASSISTANT:  Jene Every, MD   ANESTHESIA:  General.   HISTORY OF PRESENT ILLNESS:  Ms. Devos is a 60 year old African  American female who is 6 weeks status post a left second toe  bunionectomy done by Dr. Ralene Cork.  She has had progressive calf pain over  the past week and presented to the emergency department at [facility] ([facility] with findings of necrotizing soft tissue infection involving  the posterior of her left calf and some patchy areas on her left eye.  We were asked to see her for an emergent surgery consultation.  On  inspection, suspicious and concerned for necrotizing soft tissue  infection and possible necrotizing fasciitis.  She was immediately  brought to the operating room.   PROCEDURE IN DETAIL:  Informed consent was obtained.  The patient  received intravenous vancomycin and clindamycin.  She was aggressively  evaluated and resuscitated by anesthesia, brought back to the operating  room.  General anesthesia was administered.  She was placed in right  lateral position.  In the entirety of her left lower extremity was  prepped and draped in sterile fashion.  Attention was first directed to  the patchy areas on her thigh.  These were excised and the necrosis  seemed to extend only into the soft tissue with good bleeding.  Hemostasis was obtained with Bovie cautery and these wounds were left  open.  Tissues were sent for pathology and culture.  Next, attention was  directed to the leg especially the soft tissues overlying her  gastrocnemius and soleus and soft tissues were divided and there was  patchy necrosis.  Once the soft tissues were opened up, the muscle  bellies were visualized and there was no frank necrosis with black and  scattered defunctionalized muscle involving the entirety of her lateral  and posterior compartments and there was some question of viability in  her anterior compartment.  We continued evaluating the muscle up towards  the knee and there seemed to be viable muscle 3 to 4 cm distal to the  knee and that was the point where the viability and there was no frank  pus; however, there was dishwater fluid dissecting along the fascial  planes consistent with a necrotizing fasciitis suspicious for possibly a  strap organism again.  Further cultures were sent along with the gram  stain, which is not available due to some problems  with the laboratory.  Dr. Shelle Iron saw the patient in an intraoperative consult.  We talked  things over and he agreed that we would not be able to save this  patient's life without completing the guillotine BKA amputation  emergently.  Her primary anesthesia continued aggressive blood product  resuscitation and the fibula was isolated and cleaned off with rongeurs  primarily with osteal elevator and then divided with a rongeur.  Posterior vessels were suture divided and suture ligated with 2-0 silk.  We then cleaned off the muscles of posterior and anterior tibia and  cleaned it off with periosteal elevator proximally and distally.  The  tibia was then divided with the Gigli saw.  Then, we continued to divide  the remainder of the soft tissues with sharp and cautery dissection.  The main nerves were  suture ligated and divided as well as the  tibioperoneal trunk.  In addition, hemostasis was obtained.  The  remainder of the soft tissues were divided and the specimen was passed  off.  All of the muscles visualized were viable and contracted with  Bovie application.  Several additional figure-of-eight 2-0 silk suture  ligatures were placed to control bleeding both in the muscular  perforators and subcutaneous tissues.  Once we achieved decent  hemostasis, we decided to take back the tibia a little bit further as it  was exposed by about 2 cm out from the soft tissues, so we then used the  Gigli saw again to take that back.  Hemostasis was then ensured.  In  addition, I placed a modified rapid deployment hemostatic bandage on the  end of the stump.  After the area was irrigated, the stump was then  wrapped with a pressure dressing with bulky sterile gauze and an Ace  wrap.  She continued to be aggressively resuscitated and will be taken  straight to the intensive care unit for Infectious Disease, Pulmonary,  and Critical Care consultation and continued critical care.  She will  need to return to the operating room within the next 12 to 24 hours and  we will make that determination and plan at that time.  She remains in  critical condition.      Gabrielle Dare Janee Morn, M.D.  Electronically Signed     BET/MEDQ  D:  03/12/2008  T:  03/13/2008  Job:  [id number]   cc:   Jene Every, M.D.  Alvan Dame, D.P.M.  [facility]  Fransisco Hertz, M.D.

## 2010-10-11 ENCOUNTER — Other Ambulatory Visit (HOSPITAL_COMMUNITY): Payer: Self-pay | Admitting: Oncology

## 2010-10-11 ENCOUNTER — Encounter (HOSPITAL_BASED_OUTPATIENT_CLINIC_OR_DEPARTMENT_OTHER): Payer: Medicare Other | Admitting: Oncology

## 2010-10-11 DIAGNOSIS — D500 Iron deficiency anemia secondary to blood loss (chronic): Secondary | ICD-10-CM

## 2010-10-11 DIAGNOSIS — D509 Iron deficiency anemia, unspecified: Secondary | ICD-10-CM

## 2010-10-11 LAB — CBC WITH DIFFERENTIAL/PLATELET
Basophils Absolute: 0 10*3/uL (ref 0.0–0.1)
EOS%: 1.4 % (ref 0.0–7.0)
Eosinophils Absolute: 0.1 10*3/uL (ref 0.0–0.5)
HCT: 29.6 % — ABNORMAL LOW (ref 34.8–46.6)
HGB: 9.9 g/dL — ABNORMAL LOW (ref 11.6–15.9)
LYMPH%: 27 % (ref 14.0–49.7)
MCH: 32.4 pg (ref 25.1–34.0)
MCV: 96.1 fL (ref 79.5–101.0)
MONO%: 9.9 % (ref 0.0–14.0)
NEUT#: 3.1 10*3/uL (ref 1.5–6.5)
NEUT%: 61.3 % (ref 38.4–76.8)
Platelets: 269 10*3/uL (ref 145–400)
RDW: 14.4 % (ref 11.2–14.5)

## 2010-10-13 NOTE — Assessment & Plan Note (Signed)
[name] is back regarding her left above-knee amputation.  Pain still is in  the realm of 4-6/10.  She has had more phantom pain recently, which she  thought had gone away.  She came in today quite frustrated that her pain  still a problem.  She states that she is in a bad mood today.  She is on  Neurontin 300 mg t.i.d. and uses Norco 1-2 times a day for breakthrough  pain.  She also takes Skelaxin.  She is due for a socket replacement per  [facility] this month.  Pain interferes with general activity,  relations with others, enjoyment of life on a moderate level.  Pain is  described as constant, aching and burning.  She notes that she may have  some blisters over the left thumb.  Sleep is fair, but usually limited  by pain in her phantom limb.   REVIEW OF SYSTEMS:  Notable for the above.  Full review of systems in  the written health and history section of the chart.   SOCIAL HISTORY:  The patient is widowed.   PHYSICAL EXAMINATION:  Blood pressure 163/89, pulse is 79, respiratory  is 18, she is sating 96% on room air.  We removed the left prosthesis  today and the residual limb is intact. There is some areas of chronic  pressure, but no areas of breakdown or erythema.  No blisters were seen.  Area was generally sensitive to touch, particularly on the distal femur.  She has an old scarring noted from prior surgeries.  Strength in the leg  was 4/5 with hip flexion and extension.  She has excellent right lower  extremity strength.  She is able to transfer independently.  She has 5/5  strength in her upper extremities.  HEART:  Regular.  CHEST:  Clear.  ABDOMEN:  Soft, nontender.  NEUROLOGIC:  She is appropriate.  Her mood improved during our visit  today.   ASSESSMENT:  1. Left above-knee amputation.  2. Hypertension.  3. Stump and phantom pain.  I think she needs a new socket, which      should help her pain dramatically.   PLAN:  1. Await socket replacement by  [facility].  2. We will increase Neurontin slowly up to 600 mg t.i.d.  She may stop      at any point of the titration where she feels her pain is better      controlled.  This is written out in detail for her today.  3. Continue hydrocodone for breakthrough pain 1-2 times per day.  She      will use Flexeril for spasm.  4. She will call me back as needed if any problems.      Ranelle Oyster, M.D.  Electronically Signed     ZTS/MedQ  D:  02/01/2009 13:34:01  T:  02/02/2009 04:16:55  Job #:  [id number]   cc:   Tresa Endo L. Philipp Deputy, M.D.  Fax: [phone]

## 2010-10-14 LAB — COMPREHENSIVE METABOLIC PANEL
AST: 80 U/L — ABNORMAL HIGH (ref 0–37)
Albumin: 3.3 g/dL — ABNORMAL LOW (ref 3.5–5.2)
Alkaline Phosphatase: 71 U/L (ref 39–117)
BUN: 14 mg/dL (ref 6–23)
Creatinine, Ser: 0.88 mg/dL (ref 0.40–1.20)
Glucose, Bld: 183 mg/dL — ABNORMAL HIGH (ref 70–99)
Potassium: 3.6 mEq/L (ref 3.5–5.3)

## 2010-10-14 LAB — IRON AND TIBC
%SAT: 31 % (ref 20–55)
Iron: 105 ug/dL (ref 42–145)
TIBC: 340 ug/dL (ref 250–470)
UIBC: 235 ug/dL

## 2010-10-14 LAB — TRANSFERRIN RECEPTOR, SOLUABLE: Transferrin Receptor, Soluble: 0.99 mg/L (ref 0.76–1.76)

## 2010-10-14 LAB — FERRITIN: Ferritin: 468 ng/mL — ABNORMAL HIGH (ref 10–291)

## 2010-10-19 ENCOUNTER — Other Ambulatory Visit (HOSPITAL_COMMUNITY): Payer: Self-pay | Admitting: Oncology

## 2010-10-19 LAB — FECAL OCCULT BLOOD, GUAIAC: Occult Blood: POSITIVE

## 2010-11-09 ENCOUNTER — Other Ambulatory Visit (HOSPITAL_COMMUNITY): Payer: Self-pay | Admitting: Oncology

## 2010-11-09 ENCOUNTER — Encounter (HOSPITAL_BASED_OUTPATIENT_CLINIC_OR_DEPARTMENT_OTHER): Payer: Medicare Other | Admitting: Oncology

## 2010-11-09 DIAGNOSIS — D500 Iron deficiency anemia secondary to blood loss (chronic): Secondary | ICD-10-CM

## 2010-11-09 DIAGNOSIS — D509 Iron deficiency anemia, unspecified: Secondary | ICD-10-CM

## 2010-11-09 LAB — COMPREHENSIVE METABOLIC PANEL
AST: 57 U/L — ABNORMAL HIGH (ref 0–37)
Albumin: 3.8 g/dL (ref 3.5–5.2)
Alkaline Phosphatase: 77 U/L (ref 39–117)
BUN: 15 mg/dL (ref 6–23)
Potassium: 3.7 mEq/L (ref 3.5–5.3)
Sodium: 137 mEq/L (ref 135–145)
Total Bilirubin: 0.3 mg/dL (ref 0.3–1.2)
Total Protein: 6.8 g/dL (ref 6.0–8.3)

## 2010-11-09 LAB — CBC WITH DIFFERENTIAL/PLATELET
BASO%: 0.1 % (ref 0.0–2.0)
Basophils Absolute: 0 10*3/uL (ref 0.0–0.1)
EOS%: 1.7 % (ref 0.0–7.0)
HGB: 9.9 g/dL — ABNORMAL LOW (ref 11.6–15.9)
MCH: 30.8 pg (ref 25.1–34.0)
MCHC: 32.7 g/dL (ref 31.5–36.0)
MCV: 94.1 fL (ref 79.5–101.0)
MONO%: 14.2 % — ABNORMAL HIGH (ref 0.0–14.0)
NEUT%: 59.2 % (ref 38.4–76.8)
RDW: 14.7 % — ABNORMAL HIGH (ref 11.2–14.5)
lymph#: 1.3 10*3/uL (ref 0.9–3.3)

## 2010-11-09 LAB — MORPHOLOGY: PLT EST: INCREASED

## 2010-11-09 LAB — RETICULOCYTES: RBC: 3.4 10*6/uL — ABNORMAL LOW (ref 3.70–5.45)

## 2010-12-07 ENCOUNTER — Encounter (HOSPITAL_BASED_OUTPATIENT_CLINIC_OR_DEPARTMENT_OTHER): Payer: Medicare Other | Admitting: Oncology

## 2010-12-07 ENCOUNTER — Other Ambulatory Visit (HOSPITAL_COMMUNITY): Payer: Self-pay | Admitting: Oncology

## 2010-12-07 DIAGNOSIS — D509 Iron deficiency anemia, unspecified: Secondary | ICD-10-CM

## 2010-12-07 LAB — CBC WITH DIFFERENTIAL/PLATELET
Basophils Absolute: 0 10*3/uL (ref 0.0–0.1)
EOS%: 1.6 % (ref 0.0–7.0)
Eosinophils Absolute: 0.1 10*3/uL (ref 0.0–0.5)
HGB: 8.9 g/dL — ABNORMAL LOW (ref 11.6–15.9)
LYMPH%: 34.5 % (ref 14.0–49.7)
MCH: 27.1 pg (ref 25.1–34.0)
MCV: 85.5 fL (ref 79.5–101.0)
MONO%: 11.3 % (ref 0.0–14.0)
Platelets: 506 10*3/uL — ABNORMAL HIGH (ref 145–400)
RDW: 20 % — ABNORMAL HIGH (ref 11.2–14.5)

## 2010-12-07 LAB — COMPREHENSIVE METABOLIC PANEL
Albumin: 3.8 g/dL (ref 3.5–5.2)
Alkaline Phosphatase: 77 U/L (ref 39–117)
BUN: 17 mg/dL (ref 6–23)
CO2: 24 mEq/L (ref 19–32)
Glucose, Bld: 172 mg/dL — ABNORMAL HIGH (ref 70–99)
Total Bilirubin: 0.3 mg/dL (ref 0.3–1.2)
Total Protein: 7.1 g/dL (ref 6.0–8.3)

## 2010-12-07 LAB — FERRITIN: Ferritin: 61 ng/mL (ref 10–291)

## 2010-12-15 ENCOUNTER — Encounter (HOSPITAL_BASED_OUTPATIENT_CLINIC_OR_DEPARTMENT_OTHER): Payer: Medicare Other | Admitting: Oncology

## 2010-12-15 DIAGNOSIS — D509 Iron deficiency anemia, unspecified: Secondary | ICD-10-CM

## 2010-12-22 ENCOUNTER — Encounter (HOSPITAL_BASED_OUTPATIENT_CLINIC_OR_DEPARTMENT_OTHER): Payer: Medicare Other | Admitting: Oncology

## 2010-12-22 DIAGNOSIS — D509 Iron deficiency anemia, unspecified: Secondary | ICD-10-CM

## 2011-01-04 ENCOUNTER — Other Ambulatory Visit (HOSPITAL_COMMUNITY): Payer: Self-pay | Admitting: Oncology

## 2011-01-04 ENCOUNTER — Encounter (HOSPITAL_BASED_OUTPATIENT_CLINIC_OR_DEPARTMENT_OTHER): Payer: Medicare Other | Admitting: Oncology

## 2011-01-04 DIAGNOSIS — D509 Iron deficiency anemia, unspecified: Secondary | ICD-10-CM

## 2011-01-04 DIAGNOSIS — D500 Iron deficiency anemia secondary to blood loss (chronic): Secondary | ICD-10-CM

## 2011-01-04 LAB — RETICULOCYTES
RBC: 3.6 10*6/uL — ABNORMAL LOW (ref 3.70–5.45)
Retic %: 3.58 % — ABNORMAL HIGH (ref 0.70–2.10)
Retic Ct Abs: 128.88 10*3/uL — ABNORMAL HIGH (ref 33.70–90.70)

## 2011-01-05 LAB — CBC WITH DIFFERENTIAL/PLATELET
BASO%: 0.4 % (ref 0.0–2.0)
Eosinophils Absolute: 0.1 10*3/uL (ref 0.0–0.5)
HCT: 33.2 % — ABNORMAL LOW (ref 34.8–46.6)
LYMPH%: 26.5 % (ref 14.0–49.7)
MCHC: 31.3 g/dL — ABNORMAL LOW (ref 31.5–36.0)
MONO#: 0.6 10*3/uL (ref 0.1–0.9)
NEUT#: 2.9 10*3/uL (ref 1.5–6.5)
Platelets: 312 10*3/uL (ref 145–400)
RBC: 3.57 10*6/uL — ABNORMAL LOW (ref 3.70–5.45)
WBC: 4.8 10*3/uL (ref 3.9–10.3)
lymph#: 1.3 10*3/uL (ref 0.9–3.3)
nRBC: 0 % (ref 0–0)

## 2011-01-08 ENCOUNTER — Other Ambulatory Visit (HOSPITAL_COMMUNITY): Payer: Self-pay | Admitting: Oncology

## 2011-01-08 ENCOUNTER — Encounter (HOSPITAL_BASED_OUTPATIENT_CLINIC_OR_DEPARTMENT_OTHER): Payer: Medicare Other | Admitting: Oncology

## 2011-01-08 DIAGNOSIS — D509 Iron deficiency anemia, unspecified: Secondary | ICD-10-CM

## 2011-01-16 ENCOUNTER — Other Ambulatory Visit (HOSPITAL_COMMUNITY): Payer: Self-pay | Admitting: Family Medicine

## 2011-01-16 DIAGNOSIS — Z1231 Encounter for screening mammogram for malignant neoplasm of breast: Secondary | ICD-10-CM

## 2011-01-19 ENCOUNTER — Ambulatory Visit (HOSPITAL_COMMUNITY)
Admission: RE | Admit: 2011-01-19 | Discharge: 2011-01-19 | Disposition: A | Discharge status: Home / Self Care | Payer: Medicare Other | Source: Ambulatory Visit | Attending: Family Medicine | Admitting: Family Medicine

## 2011-01-19 ENCOUNTER — Ambulatory Visit (HOSPITAL_COMMUNITY): Payer: Medicare Other

## 2011-01-19 DIAGNOSIS — Z1231 Encounter for screening mammogram for malignant neoplasm of breast: Secondary | ICD-10-CM | POA: Insufficient documentation

## 2011-01-24 ENCOUNTER — Other Ambulatory Visit: Payer: Self-pay | Admitting: Family Medicine

## 2011-01-24 DIAGNOSIS — R928 Other abnormal and inconclusive findings on diagnostic imaging of breast: Secondary | ICD-10-CM

## 2011-01-26 ENCOUNTER — Ambulatory Visit (HOSPITAL_COMMUNITY)
Admission: RE | Admit: 2011-01-26 | Discharge: 2011-01-26 | Disposition: A | Discharge status: Home / Self Care | Payer: Medicare Other | Source: Ambulatory Visit | Attending: Gastroenterology | Admitting: Gastroenterology

## 2011-01-26 DIAGNOSIS — I10 Essential (primary) hypertension: Secondary | ICD-10-CM | POA: Insufficient documentation

## 2011-01-26 DIAGNOSIS — E785 Hyperlipidemia, unspecified: Secondary | ICD-10-CM | POA: Insufficient documentation

## 2011-01-26 DIAGNOSIS — K219 Gastro-esophageal reflux disease without esophagitis: Secondary | ICD-10-CM | POA: Insufficient documentation

## 2011-01-26 DIAGNOSIS — D500 Iron deficiency anemia secondary to blood loss (chronic): Secondary | ICD-10-CM | POA: Insufficient documentation

## 2011-01-26 DIAGNOSIS — K31811 Angiodysplasia of stomach and duodenum with bleeding: Secondary | ICD-10-CM | POA: Insufficient documentation

## 2011-02-01 ENCOUNTER — Encounter (HOSPITAL_BASED_OUTPATIENT_CLINIC_OR_DEPARTMENT_OTHER): Payer: Medicare Other | Admitting: Oncology

## 2011-02-01 ENCOUNTER — Other Ambulatory Visit (HOSPITAL_COMMUNITY): Payer: Self-pay | Admitting: Oncology

## 2011-02-01 DIAGNOSIS — D509 Iron deficiency anemia, unspecified: Secondary | ICD-10-CM

## 2011-02-01 DIAGNOSIS — D500 Iron deficiency anemia secondary to blood loss (chronic): Secondary | ICD-10-CM

## 2011-02-01 LAB — CBC & DIFF AND RETIC
BASO%: 0.4 % (ref 0.0–2.0)
LYMPH%: 31.2 % (ref 14.0–49.7)
MCHC: 33 g/dL (ref 31.5–36.0)
MCV: 85.8 fL (ref 79.5–101.0)
MONO%: 10.9 % (ref 0.0–14.0)
Platelets: 306 10*3/uL (ref 145–400)
RBC: 3.46 10*6/uL — ABNORMAL LOW (ref 3.70–5.45)
RDW: 15.9 % — ABNORMAL HIGH (ref 11.2–14.5)
WBC: 5.2 10*3/uL (ref 3.9–10.3)
nRBC: 0 % (ref 0–0)

## 2011-02-01 LAB — COMPREHENSIVE METABOLIC PANEL
ALT: 47 U/L — ABNORMAL HIGH (ref 0–35)
Alkaline Phosphatase: 88 U/L (ref 39–117)
Creatinine, Ser: 0.75 mg/dL (ref 0.50–1.10)
Sodium: 141 mEq/L (ref 135–145)
Total Bilirubin: 0.3 mg/dL (ref 0.3–1.2)
Total Protein: 5.9 g/dL — ABNORMAL LOW (ref 6.0–8.3)

## 2011-02-01 LAB — FERRITIN: Ferritin: 28 ng/mL (ref 10–291)

## 2011-02-02 ENCOUNTER — Emergency Department (HOSPITAL_COMMUNITY)
Admission: EM | Admit: 2011-02-02 | Discharge: 2011-02-02 | Disposition: A | Discharge status: Home / Self Care | Payer: Medicare Other | Attending: Emergency Medicine | Admitting: Emergency Medicine

## 2011-02-02 ENCOUNTER — Emergency Department (HOSPITAL_COMMUNITY): Payer: Medicare Other

## 2011-02-02 DIAGNOSIS — Z79899 Other long term (current) drug therapy: Secondary | ICD-10-CM | POA: Insufficient documentation

## 2011-02-02 DIAGNOSIS — R079 Chest pain, unspecified: Secondary | ICD-10-CM | POA: Insufficient documentation

## 2011-02-02 DIAGNOSIS — S78119A Complete traumatic amputation at level between unspecified hip and knee, initial encounter: Secondary | ICD-10-CM | POA: Insufficient documentation

## 2011-02-02 DIAGNOSIS — E119 Type 2 diabetes mellitus without complications: Secondary | ICD-10-CM | POA: Insufficient documentation

## 2011-02-02 DIAGNOSIS — R059 Cough, unspecified: Secondary | ICD-10-CM | POA: Insufficient documentation

## 2011-02-02 DIAGNOSIS — Z7982 Long term (current) use of aspirin: Secondary | ICD-10-CM | POA: Insufficient documentation

## 2011-02-02 DIAGNOSIS — E876 Hypokalemia: Secondary | ICD-10-CM | POA: Insufficient documentation

## 2011-02-02 DIAGNOSIS — M129 Arthropathy, unspecified: Secondary | ICD-10-CM | POA: Insufficient documentation

## 2011-02-02 DIAGNOSIS — R630 Anorexia: Secondary | ICD-10-CM | POA: Insufficient documentation

## 2011-02-02 DIAGNOSIS — Z8619 Personal history of other infectious and parasitic diseases: Secondary | ICD-10-CM | POA: Insufficient documentation

## 2011-02-02 DIAGNOSIS — R05 Cough: Secondary | ICD-10-CM | POA: Insufficient documentation

## 2011-02-02 DIAGNOSIS — I10 Essential (primary) hypertension: Secondary | ICD-10-CM | POA: Insufficient documentation

## 2011-02-02 DIAGNOSIS — R5381 Other malaise: Secondary | ICD-10-CM | POA: Insufficient documentation

## 2011-02-02 LAB — COMPREHENSIVE METABOLIC PANEL
AST: 82 U/L — ABNORMAL HIGH (ref 0–37)
BUN: 10 mg/dL (ref 6–23)
CO2: 30 mEq/L (ref 19–32)
Calcium: 8.3 mg/dL — ABNORMAL LOW (ref 8.4–10.5)
Chloride: 105 mEq/L (ref 96–112)
Creatinine, Ser: 0.64 mg/dL (ref 0.50–1.10)
GFR calc Af Amer: >60 mL/min (ref >60)
GFR calc non Af Amer: >60 mL/min (ref >60)
Total Bilirubin: 0.3 mg/dL (ref 0.3–1.2)

## 2011-02-02 LAB — POCT I-STAT, CHEM 8
BUN: 8 mg/dL (ref 6–23)
Creatinine, Ser: 0.7 mg/dL (ref 0.50–1.10)
Glucose, Bld: 127 mg/dL — ABNORMAL HIGH (ref 70–99)
Hemoglobin: 11.9 g/dL — ABNORMAL LOW (ref 12.0–15.0)
TCO2: 28 mmol/L (ref 0–100)

## 2011-02-02 LAB — DIFFERENTIAL
Eosinophils Absolute: 0.1 10*3/uL (ref 0.0–0.7)
Eosinophils Relative: 1 % (ref 0–5)
Lymphocytes Relative: 28 % (ref 12–46)
Lymphs Abs: 1.5 10*3/uL (ref 0.7–4.0)
Monocytes Relative: 12 % (ref 3–12)
Neutrophils Relative %: 58 % (ref 43–77)

## 2011-02-02 LAB — CBC
HCT: 30.9 % — ABNORMAL LOW (ref 36.0–46.0)
MCH: 28.3 pg (ref 26.0–34.0)
MCV: 85.8 fL (ref 78.0–100.0)
Platelets: 344 10*3/uL (ref 150–400)
RBC: 3.6 MIL/uL — ABNORMAL LOW (ref 3.87–5.11)
WBC: 5.4 10*3/uL (ref 4.0–10.5)

## 2011-02-05 ENCOUNTER — Encounter (HOSPITAL_BASED_OUTPATIENT_CLINIC_OR_DEPARTMENT_OTHER): Payer: Medicare Other | Admitting: Oncology

## 2011-02-05 DIAGNOSIS — D500 Iron deficiency anemia secondary to blood loss (chronic): Secondary | ICD-10-CM

## 2011-02-05 DIAGNOSIS — D509 Iron deficiency anemia, unspecified: Secondary | ICD-10-CM

## 2011-02-05 LAB — POCT I-STAT TROPONIN I: Troponin i, poc: 0 ng/mL (ref 0.00–0.08)

## 2011-02-12 ENCOUNTER — Encounter (HOSPITAL_BASED_OUTPATIENT_CLINIC_OR_DEPARTMENT_OTHER): Payer: Medicare Other | Admitting: Oncology

## 2011-02-12 DIAGNOSIS — D500 Iron deficiency anemia secondary to blood loss (chronic): Secondary | ICD-10-CM

## 2011-02-12 DIAGNOSIS — D509 Iron deficiency anemia, unspecified: Secondary | ICD-10-CM

## 2011-02-16 ENCOUNTER — Ambulatory Visit (HOSPITAL_COMMUNITY)
Admission: RE | Admit: 2011-02-16 | Discharge: 2011-02-16 | Disposition: A | Discharge status: Home / Self Care | Payer: Medicare Other | Source: Ambulatory Visit | Attending: Gastroenterology | Admitting: Gastroenterology

## 2011-02-16 DIAGNOSIS — K31811 Angiodysplasia of stomach and duodenum with bleeding: Secondary | ICD-10-CM | POA: Insufficient documentation

## 2011-02-19 ENCOUNTER — Encounter (HOSPITAL_BASED_OUTPATIENT_CLINIC_OR_DEPARTMENT_OTHER): Payer: Medicare Other | Admitting: Oncology

## 2011-02-19 DIAGNOSIS — D500 Iron deficiency anemia secondary to blood loss (chronic): Secondary | ICD-10-CM

## 2011-02-19 DIAGNOSIS — D509 Iron deficiency anemia, unspecified: Secondary | ICD-10-CM

## 2011-02-20 ENCOUNTER — Ambulatory Visit
Admission: RE | Admit: 2011-02-20 | Discharge: 2011-02-20 | Disposition: A | Discharge status: Home / Self Care | Payer: Medicare Other | Source: Ambulatory Visit | Attending: Family Medicine | Admitting: Family Medicine

## 2011-02-20 DIAGNOSIS — R928 Other abnormal and inconclusive findings on diagnostic imaging of breast: Secondary | ICD-10-CM

## 2011-02-26 LAB — GLUCOSE, CAPILLARY
Glucose-Capillary: 101 — ABNORMAL HIGH
Glucose-Capillary: 102 — ABNORMAL HIGH
Glucose-Capillary: 104 — ABNORMAL HIGH
Glucose-Capillary: 105 — ABNORMAL HIGH
Glucose-Capillary: 106 — ABNORMAL HIGH
Glucose-Capillary: 107 — ABNORMAL HIGH
Glucose-Capillary: 107 — ABNORMAL HIGH
Glucose-Capillary: 112 — ABNORMAL HIGH
Glucose-Capillary: 113 — ABNORMAL HIGH
Glucose-Capillary: 114 — ABNORMAL HIGH
Glucose-Capillary: 115 — ABNORMAL HIGH
Glucose-Capillary: 116 — ABNORMAL HIGH
Glucose-Capillary: 116 — ABNORMAL HIGH
Glucose-Capillary: 117 — ABNORMAL HIGH
Glucose-Capillary: 119 — ABNORMAL HIGH
Glucose-Capillary: 120 — ABNORMAL HIGH
Glucose-Capillary: 121 — ABNORMAL HIGH
Glucose-Capillary: 121 — ABNORMAL HIGH
Glucose-Capillary: 122 — ABNORMAL HIGH
Glucose-Capillary: 130 — ABNORMAL HIGH
Glucose-Capillary: 136 — ABNORMAL HIGH
Glucose-Capillary: 136 — ABNORMAL HIGH
Glucose-Capillary: 137 — ABNORMAL HIGH
Glucose-Capillary: 141 — ABNORMAL HIGH
Glucose-Capillary: 83
Glucose-Capillary: 84
Glucose-Capillary: 87
Glucose-Capillary: 91
Glucose-Capillary: 91
Glucose-Capillary: 93
Glucose-Capillary: 96
Glucose-Capillary: 99
Glucose-Capillary: 99

## 2011-02-26 LAB — CBC
HCT: 22.7 — ABNORMAL LOW
HCT: 23.9 — ABNORMAL LOW
HCT: 24.2 — ABNORMAL LOW
HCT: 24.8 — ABNORMAL LOW
HCT: 25 — ABNORMAL LOW
HCT: 26.6 — ABNORMAL LOW
HCT: 27.6 — ABNORMAL LOW
HCT: 28.6 — ABNORMAL LOW
HCT: 29 — ABNORMAL LOW
HCT: 31.3 — ABNORMAL LOW
HCT: 32.8 — ABNORMAL LOW
Hemoglobin: 10 — ABNORMAL LOW
Hemoglobin: 10.1 — ABNORMAL LOW
Hemoglobin: 10.3 — ABNORMAL LOW
Hemoglobin: 10.6 — ABNORMAL LOW
Hemoglobin: 11.2 — ABNORMAL LOW
Hemoglobin: 7.7 — CL
Hemoglobin: 8.2 — ABNORMAL LOW
Hemoglobin: 8.4 — ABNORMAL LOW
Hemoglobin: 8.8 — ABNORMAL LOW
Hemoglobin: 9 — ABNORMAL LOW
Hemoglobin: 9.1 — ABNORMAL LOW
Hemoglobin: 9.3 — ABNORMAL LOW
Hemoglobin: 9.6 — ABNORMAL LOW
MCHC: 33.4
MCHC: 33.5
MCHC: 33.7
MCHC: 33.7
MCHC: 33.8
MCHC: 33.8
MCHC: 33.8
MCHC: 33.9
MCHC: 33.9
MCHC: 34
MCHC: 34.5
MCHC: 34.6
MCHC: 34.8
MCV: 88.1
MCV: 88.2
MCV: 88.3
MCV: 88.4
MCV: 88.4
MCV: 88.4
MCV: 89.1
MCV: 89.3
MCV: 89.6
MCV: 89.7
MCV: 91
MCV: 91
Platelets: 108 — ABNORMAL LOW
Platelets: 125 — ABNORMAL LOW
Platelets: 133 — ABNORMAL LOW
Platelets: 170
Platelets: 208
Platelets: 303
Platelets: 375
Platelets: 573 — ABNORMAL HIGH
RBC: 2.66 — ABNORMAL LOW
RBC: 2.71 — ABNORMAL LOW
RBC: 2.78 — ABNORMAL LOW
RBC: 2.95 — ABNORMAL LOW
RBC: 3 — ABNORMAL LOW
RBC: 3.07 — ABNORMAL LOW
RBC: 3.14 — ABNORMAL LOW
RBC: 3.2 — ABNORMAL LOW
RBC: 3.24 — ABNORMAL LOW
RBC: 3.24 — ABNORMAL LOW
RBC: 3.34 — ABNORMAL LOW
RBC: 3.34 — ABNORMAL LOW
RBC: 3.38 — ABNORMAL LOW
RBC: 3.41 — ABNORMAL LOW
RBC: 3.46 — ABNORMAL LOW
RBC: 3.55 — ABNORMAL LOW
RBC: 3.74 — ABNORMAL LOW
RDW: 13
RDW: 14.6
RDW: 14.9
RDW: 15
RDW: 15.1
RDW: 15.1
RDW: 15.3
RDW: 15.3
RDW: 16.1 — ABNORMAL HIGH
WBC: 10
WBC: 3.8 — ABNORMAL LOW
WBC: 4.1
WBC: 4.1
WBC: 4.2
WBC: 5.3
WBC: 5.4
WBC: 5.5
WBC: 5.7
WBC: 6.1
WBC: 6.7
WBC: 6.8
WBC: 7
WBC: 9.3

## 2011-02-26 LAB — POCT I-STAT, CHEM 8
Calcium, Ion: 0.89 — ABNORMAL LOW
Chloride: 98
Creatinine, Ser: 0.8
Glucose, Bld: 134 — ABNORMAL HIGH
Hemoglobin: 12.2
Potassium: 3 — ABNORMAL LOW

## 2011-02-26 LAB — BASIC METABOLIC PANEL
BUN: 10
BUN: 8
BUN: 9
BUN: 9
CO2: 26
CO2: 26
CO2: 26
CO2: 28
CO2: 29
Calcium: 7.3 — ABNORMAL LOW
Calcium: 7.6 — ABNORMAL LOW
Calcium: 7.8 — ABNORMAL LOW
Calcium: 8.2 — ABNORMAL LOW
Calcium: 8.2 — ABNORMAL LOW
Chloride: 102
Chloride: 104
Chloride: 107
Chloride: 110
Chloride: 99
Chloride: 99
Chloride: 99
Creatinine, Ser: 0.78
Creatinine, Ser: 0.87
Creatinine, Ser: 1.06
Creatinine, Ser: 1.13
Creatinine, Ser: 1.24 — ABNORMAL HIGH
Creatinine, Ser: 1.26 — ABNORMAL HIGH
GFR calc Af Amer: 54 — ABNORMAL LOW
GFR calc Af Amer: >60
GFR calc Af Amer: >60
GFR calc Af Amer: >60
GFR calc Af Amer: >60
GFR calc Af Amer: >60
GFR calc Af Amer: >60
GFR calc non Af Amer: 54 — ABNORMAL LOW
GFR calc non Af Amer: >60
Glucose, Bld: 106 — ABNORMAL HIGH
Glucose, Bld: 123 — ABNORMAL HIGH
Glucose, Bld: 149 — ABNORMAL HIGH
Glucose, Bld: 86
Potassium: 3.3 — ABNORMAL LOW
Potassium: 3.6
Potassium: 3.7
Potassium: 4
Potassium: 4.2
Potassium: 4.6
Sodium: 136
Sodium: 138
Sodium: 142

## 2011-02-26 LAB — CROSSMATCH
ABO/RH(D): AB POS
Antibody Screen: NEGATIVE

## 2011-02-26 LAB — URINALYSIS, ROUTINE W REFLEX MICROSCOPIC
Glucose, UA: NEGATIVE
Hgb urine dipstick: NEGATIVE
Ketones, ur: NEGATIVE
Protein, ur: NEGATIVE
Urobilinogen, UA: 1

## 2011-02-26 LAB — COMPREHENSIVE METABOLIC PANEL
ALT: 16
ALT: 32
AST: 49 — ABNORMAL HIGH
Albumin: 2.9 — ABNORMAL LOW
Alkaline Phosphatase: 43
Alkaline Phosphatase: 63
BUN: 10
BUN: 14
CO2: 22
CO2: 26
Calcium: 6.3 — CL
Calcium: 8.2 — ABNORMAL LOW
Chloride: 105
Chloride: 105
Chloride: 96
Creatinine, Ser: 0.6
GFR calc Af Amer: >60
GFR calc non Af Amer: 44 — ABNORMAL LOW
GFR calc non Af Amer: >60
Glucose, Bld: 200 — ABNORMAL HIGH
Glucose, Bld: 94
Potassium: 3.2 — ABNORMAL LOW
Sodium: 134 — ABNORMAL LOW
Sodium: 138
Total Bilirubin: 0.7
Total Bilirubin: 1.8 — ABNORMAL HIGH
Total Bilirubin: 2.2 — ABNORMAL HIGH
Total Protein: 7.4

## 2011-02-26 LAB — POCT I-STAT 3, ART BLOOD GAS (G3+)
Acid-Base Excess: 3 — ABNORMAL HIGH
Acid-Base Excess: 3 — ABNORMAL HIGH
Acid-Base Excess: 4 — ABNORMAL HIGH
Acid-base deficit: 2
O2 Saturation: 96
O2 Saturation: 97
O2 Saturation: 99
O2 Saturation: 99
Patient temperature: 101
Patient temperature: 37.71
Patient temperature: 95.9
Patient temperature: 97.9
Patient temperature: 99.5
TCO2: 23
TCO2: 26
TCO2: 28
TCO2: 28
TCO2: 29
TCO2: 29
pCO2 arterial: 29.2 — ABNORMAL LOW
pCO2 arterial: 34.7 — ABNORMAL LOW
pCO2 arterial: 38.9
pCO2 arterial: 41.3
pCO2 arterial: 42.9
pH, Arterial: 7.414 — ABNORMAL HIGH
pH, Arterial: 7.445 — ABNORMAL HIGH
pH, Arterial: 7.446 — ABNORMAL HIGH
pH, Arterial: 7.535 — ABNORMAL HIGH
pH, Arterial: 7.537 — ABNORMAL HIGH
pO2, Arterial: 97

## 2011-02-26 LAB — PREPARE RBC (CROSSMATCH)

## 2011-02-26 LAB — POCT I-STAT 4, (NA,K, GLUC, HGB,HCT)
HCT: 23 — ABNORMAL LOW
Hemoglobin: 7.8 — CL
Potassium: 3.1 — ABNORMAL LOW
Potassium: 3.4 — ABNORMAL LOW
Sodium: 135

## 2011-02-26 LAB — CULTURE, BLOOD (ROUTINE X 2)
Culture: NO GROWTH
Culture: NO GROWTH

## 2011-02-26 LAB — PREPARE FRESH FROZEN PLASMA

## 2011-02-26 LAB — HEPATITIS PANEL, ACUTE
Hep A IgM: NEGATIVE
Hep A IgM: NEGATIVE
Hep B C IgM: NEGATIVE
Hepatitis B Surface Ag: NEGATIVE
Hepatitis B Surface Ag: NEGATIVE

## 2011-02-26 LAB — DIFFERENTIAL
Basophils Relative: 0
Eosinophils Relative: 0
Monocytes Absolute: 0.9
Monocytes Relative: 15 — ABNORMAL HIGH
Neutro Abs: 3.9

## 2011-02-26 LAB — URINE MICROSCOPIC-ADD ON

## 2011-02-26 LAB — TISSUE CULTURE
Culture: NO GROWTH
Culture: NO GROWTH

## 2011-02-26 LAB — GRAM STAIN

## 2011-02-26 LAB — PREALBUMIN: Prealbumin: 4.9 — ABNORMAL LOW

## 2011-02-26 LAB — HEMOGLOBIN AND HEMATOCRIT, BLOOD
HCT: 29 — ABNORMAL LOW
HCT: 32.2 — ABNORMAL LOW
Hemoglobin: 10.6 — ABNORMAL LOW
Hemoglobin: 9.9 — ABNORMAL LOW

## 2011-02-26 LAB — URINE CULTURE
Colony Count: NO GROWTH
Culture: NO GROWTH
Special Requests: NEGATIVE

## 2011-02-26 LAB — MAGNESIUM
Magnesium: 1 — ABNORMAL LOW
Magnesium: 1.4 — ABNORMAL LOW
Magnesium: 1.8
Magnesium: 2

## 2011-02-26 LAB — PROTIME-INR
Prothrombin Time: 15.8 — ABNORMAL HIGH
Prothrombin Time: 20 — ABNORMAL HIGH

## 2011-02-26 LAB — ANAEROBIC CULTURE

## 2011-02-26 LAB — CULTURE, RESPIRATORY W GRAM STAIN

## 2011-02-26 LAB — HEMOGLOBIN A1C: Mean Plasma Glucose: 111

## 2011-02-26 LAB — VANCOMYCIN, TROUGH: Vancomycin Tr: 30.3

## 2011-02-26 LAB — ABO/RH: ABO/RH(D): AB POS

## 2011-02-26 LAB — OSMOLALITY, URINE: Osmolality, Ur: 491

## 2011-02-26 LAB — ALBUMIN: Albumin: 1.7 — ABNORMAL LOW

## 2011-02-27 LAB — GLUCOSE, CAPILLARY
Glucose-Capillary: 101 — ABNORMAL HIGH
Glucose-Capillary: 102 — ABNORMAL HIGH
Glucose-Capillary: 102 — ABNORMAL HIGH
Glucose-Capillary: 104 — ABNORMAL HIGH
Glucose-Capillary: 106 — ABNORMAL HIGH
Glucose-Capillary: 106 — ABNORMAL HIGH
Glucose-Capillary: 108 — ABNORMAL HIGH
Glucose-Capillary: 109 — ABNORMAL HIGH
Glucose-Capillary: 110 — ABNORMAL HIGH
Glucose-Capillary: 114 — ABNORMAL HIGH
Glucose-Capillary: 114 — ABNORMAL HIGH
Glucose-Capillary: 118 — ABNORMAL HIGH
Glucose-Capillary: 119 — ABNORMAL HIGH
Glucose-Capillary: 119 — ABNORMAL HIGH
Glucose-Capillary: 120 — ABNORMAL HIGH
Glucose-Capillary: 120 — ABNORMAL HIGH
Glucose-Capillary: 120 — ABNORMAL HIGH
Glucose-Capillary: 121 — ABNORMAL HIGH
Glucose-Capillary: 122 — ABNORMAL HIGH
Glucose-Capillary: 122 — ABNORMAL HIGH
Glucose-Capillary: 123 — ABNORMAL HIGH
Glucose-Capillary: 124 — ABNORMAL HIGH
Glucose-Capillary: 124 — ABNORMAL HIGH
Glucose-Capillary: 124 — ABNORMAL HIGH
Glucose-Capillary: 129 — ABNORMAL HIGH
Glucose-Capillary: 129 — ABNORMAL HIGH
Glucose-Capillary: 129 — ABNORMAL HIGH
Glucose-Capillary: 153 — ABNORMAL HIGH
Glucose-Capillary: 94

## 2011-02-27 LAB — COMPREHENSIVE METABOLIC PANEL
AST: 31
Albumin: 2.4 — ABNORMAL LOW
Alkaline Phosphatase: 56
Chloride: 108
Creatinine, Ser: 0.95
GFR calc Af Amer: >60
Potassium: 3.6
Total Bilirubin: 1.2

## 2011-02-27 LAB — BASIC METABOLIC PANEL
BUN: 6
BUN: 7
Calcium: 7.2 — ABNORMAL LOW
Calcium: 7.4 — ABNORMAL LOW
Calcium: 8 — ABNORMAL LOW
Chloride: 108
Creatinine, Ser: 0.81
Creatinine, Ser: 1.18
GFR calc Af Amer: >60
GFR calc Af Amer: >60
GFR calc Af Amer: >60
GFR calc non Af Amer: 48 — ABNORMAL LOW
GFR calc non Af Amer: >60
GFR calc non Af Amer: >60
GFR calc non Af Amer: >60
Glucose, Bld: 106 — ABNORMAL HIGH
Glucose, Bld: 124 — ABNORMAL HIGH
Potassium: 3 — ABNORMAL LOW
Potassium: 3.1 — ABNORMAL LOW
Potassium: 3.2 — ABNORMAL LOW
Sodium: 140
Sodium: 141

## 2011-02-27 LAB — DIFFERENTIAL
Basophils Absolute: 0
Basophils Absolute: 0.1
Eosinophils Absolute: 0.2
Eosinophils Relative: 4
Lymphocytes Relative: 13
Lymphocytes Relative: 14
Lymphocytes Relative: 18
Lymphs Abs: 1.2
Lymphs Abs: 1.4
Monocytes Absolute: 1.1 — ABNORMAL HIGH
Monocytes Absolute: 1.2 — ABNORMAL HIGH
Monocytes Relative: 13 — ABNORMAL HIGH
Neutro Abs: 7.7
Neutrophils Relative %: 73

## 2011-02-27 LAB — CBC
HCT: 37.2
Hemoglobin: 12.5
MCV: 89.4
MCV: 90.4
Platelets: 592 — ABNORMAL HIGH
Platelets: 693 — ABNORMAL HIGH
Platelets: 756 — ABNORMAL HIGH
Platelets: 790 — ABNORMAL HIGH
RBC: 4.21
RDW: 16.1 — ABNORMAL HIGH
RDW: 16.3 — ABNORMAL HIGH
RDW: 16.3 — ABNORMAL HIGH
WBC: 10.5
WBC: 8.1
WBC: 8.5
WBC: 9.5

## 2011-02-27 LAB — CLOSTRIDIUM DIFFICILE EIA: C difficile Toxins A+B, EIA: NEGATIVE

## 2011-02-27 LAB — URINALYSIS, ROUTINE W REFLEX MICROSCOPIC
Glucose, UA: NEGATIVE
Hgb urine dipstick: NEGATIVE
Protein, ur: 30 — AB
Specific Gravity, Urine: 1.016
pH: 6.5

## 2011-02-27 LAB — URINE CULTURE: Colony Count: 85000

## 2011-02-27 LAB — URINE MICROSCOPIC-ADD ON

## 2011-03-01 ENCOUNTER — Other Ambulatory Visit (HOSPITAL_COMMUNITY): Payer: Self-pay | Admitting: Oncology

## 2011-03-01 ENCOUNTER — Encounter (HOSPITAL_BASED_OUTPATIENT_CLINIC_OR_DEPARTMENT_OTHER): Payer: Medicare Other | Admitting: Oncology

## 2011-03-01 DIAGNOSIS — D509 Iron deficiency anemia, unspecified: Secondary | ICD-10-CM

## 2011-03-01 DIAGNOSIS — D500 Iron deficiency anemia secondary to blood loss (chronic): Secondary | ICD-10-CM

## 2011-03-01 LAB — CBC WITH DIFFERENTIAL/PLATELET
BASO%: 0.4 % (ref 0.0–2.0)
LYMPH%: 21.8 % (ref 14.0–49.7)
MCHC: 33.3 g/dL (ref 31.5–36.0)
MONO#: 0.7 10*3/uL (ref 0.1–0.9)
NEUT#: 3.8 10*3/uL (ref 1.5–6.5)
RBC: 3.61 10*6/uL — ABNORMAL LOW (ref 3.70–5.45)
RDW: 19.2 % — ABNORMAL HIGH (ref 11.2–14.5)
WBC: 5.9 10*3/uL (ref 3.9–10.3)
lymph#: 1.3 10*3/uL (ref 0.9–3.3)

## 2011-03-01 LAB — COMPREHENSIVE METABOLIC PANEL
ALT: 71 U/L — ABNORMAL HIGH (ref 0–35)
CO2: 24 mEq/L (ref 19–32)
Calcium: 8.5 mg/dL (ref 8.4–10.5)
Chloride: 104 mEq/L (ref 96–112)
Creatinine, Ser: 0.84 mg/dL (ref 0.50–1.10)
Glucose, Bld: 142 mg/dL — ABNORMAL HIGH (ref 70–99)
Sodium: 140 mEq/L (ref 135–145)
Total Protein: 6.8 g/dL (ref 6.0–8.3)

## 2011-03-01 LAB — LACTATE DEHYDROGENASE: LDH: 137 U/L (ref 94–250)

## 2011-03-09 ENCOUNTER — Other Ambulatory Visit (HOSPITAL_COMMUNITY)
Admission: RE | Admit: 2011-03-09 | Discharge: 2011-03-09 | Disposition: A | Discharge status: Home / Self Care | Payer: Medicare Other | Source: Ambulatory Visit | Attending: Dermatology | Admitting: Dermatology

## 2011-03-09 ENCOUNTER — Other Ambulatory Visit: Payer: Self-pay | Admitting: Family Medicine

## 2011-03-09 DIAGNOSIS — Z124 Encounter for screening for malignant neoplasm of cervix: Secondary | ICD-10-CM | POA: Insufficient documentation

## 2011-03-13 ENCOUNTER — Emergency Department (HOSPITAL_COMMUNITY)
Admission: EM | Admit: 2011-03-13 | Discharge: 2011-03-13 | Disposition: A | Discharge status: Home / Self Care | Payer: Medicare Other | Attending: Emergency Medicine | Admitting: Emergency Medicine

## 2011-03-13 DIAGNOSIS — X500XXA Overexertion from strenuous movement or load, initial encounter: Secondary | ICD-10-CM | POA: Insufficient documentation

## 2011-03-13 DIAGNOSIS — M549 Dorsalgia, unspecified: Secondary | ICD-10-CM | POA: Insufficient documentation

## 2011-03-13 DIAGNOSIS — S88119A Complete traumatic amputation at level between knee and ankle, unspecified lower leg, initial encounter: Secondary | ICD-10-CM | POA: Insufficient documentation

## 2011-03-13 DIAGNOSIS — E119 Type 2 diabetes mellitus without complications: Secondary | ICD-10-CM | POA: Insufficient documentation

## 2011-03-13 DIAGNOSIS — M62838 Other muscle spasm: Secondary | ICD-10-CM | POA: Insufficient documentation

## 2011-03-13 DIAGNOSIS — T148XXA Other injury of unspecified body region, initial encounter: Secondary | ICD-10-CM | POA: Insufficient documentation

## 2011-03-13 DIAGNOSIS — I10 Essential (primary) hypertension: Secondary | ICD-10-CM | POA: Insufficient documentation

## 2011-03-15 LAB — CBC
HCT: 31.3 — ABNORMAL LOW
Platelets: 402 — ABNORMAL HIGH
RDW: 17.4 — ABNORMAL HIGH
WBC: 5.6

## 2011-03-15 LAB — BASIC METABOLIC PANEL
BUN: 18
CO2: 33 — ABNORMAL HIGH
Calcium: 9.1
Chloride: 102
Creatinine, Ser: 0.86
GFR calc Af Amer: >60
GFR calc non Af Amer: >60
Glucose, Bld: 86
Potassium: 3.7
Sodium: 141

## 2011-03-29 ENCOUNTER — Encounter (HOSPITAL_BASED_OUTPATIENT_CLINIC_OR_DEPARTMENT_OTHER): Payer: Medicare Other | Admitting: Oncology

## 2011-03-29 ENCOUNTER — Other Ambulatory Visit (HOSPITAL_COMMUNITY): Payer: Self-pay | Admitting: Oncology

## 2011-03-29 DIAGNOSIS — D509 Iron deficiency anemia, unspecified: Secondary | ICD-10-CM

## 2011-03-29 LAB — CBC WITH DIFFERENTIAL/PLATELET
BASO%: 0.3 % (ref 0.0–2.0)
Basophils Absolute: 0 10*3/uL (ref 0.0–0.1)
EOS%: 2 % (ref 0.0–7.0)
HCT: 34.2 % — ABNORMAL LOW (ref 34.8–46.6)
HGB: 11.2 g/dL — ABNORMAL LOW (ref 11.6–15.9)
MCH: 31.8 pg (ref 25.1–34.0)
MONO#: 0.6 10*3/uL (ref 0.1–0.9)
RDW: 15.7 % — ABNORMAL HIGH (ref 11.2–14.5)
WBC: 5 10*3/uL (ref 3.9–10.3)
lymph#: 1.3 10*3/uL (ref 0.9–3.3)

## 2011-05-01 ENCOUNTER — Telehealth: Payer: Self-pay | Admitting: Oncology

## 2011-05-01 ENCOUNTER — Telehealth: Payer: Self-pay

## 2011-05-01 NOTE — Telephone Encounter (Signed)
Due to RJ's absence f/u moved to 1/8 @ 3pm per DM. Pt to have lb only 12/6. S/w pt today she is aware. Desk has also contacted pt.

## 2011-05-01 NOTE — Telephone Encounter (Signed)
I spoke with pt. She had informed Melissa the scheduler that she wanted to keep her 12/6 appt because she is in a lot of pain. Dr Arline Asp reviewed the pt's chart and stated he is treating the pt's anemia. The pt is to see her primary physician for pain issues. We will keep the pt's lab appt to monitor the anemia and will change to follow up appt to January. Pt expressed understanding.

## 2011-05-03 ENCOUNTER — Other Ambulatory Visit (HOSPITAL_COMMUNITY): Payer: Self-pay | Admitting: Oncology

## 2011-05-03 ENCOUNTER — Other Ambulatory Visit: Payer: Self-pay | Admitting: Oncology

## 2011-05-03 ENCOUNTER — Encounter: Payer: Self-pay | Admitting: Oncology

## 2011-05-03 ENCOUNTER — Ambulatory Visit: Payer: Medicare Other | Admitting: Physician Assistant

## 2011-05-03 ENCOUNTER — Other Ambulatory Visit (HOSPITAL_BASED_OUTPATIENT_CLINIC_OR_DEPARTMENT_OTHER): Payer: Medicare Other

## 2011-05-03 DIAGNOSIS — D509 Iron deficiency anemia, unspecified: Secondary | ICD-10-CM

## 2011-05-03 DIAGNOSIS — D500 Iron deficiency anemia secondary to blood loss (chronic): Secondary | ICD-10-CM

## 2011-05-03 HISTORY — DX: Iron deficiency anemia, unspecified: D50.9

## 2011-05-03 LAB — CBC & DIFF AND RETIC
BASO%: 0.3 % (ref 0.0–2.0)
EOS%: 1.9 % (ref 0.0–7.0)
HCT: 25.1 % — ABNORMAL LOW (ref 34.8–46.6)
Immature Retic Fract: 27.5 % — ABNORMAL HIGH (ref 1.60–10.00)
LYMPH%: 26 % (ref 14.0–49.7)
MCH: 25.9 pg (ref 25.1–34.0)
MCHC: 30.7 g/dL — ABNORMAL LOW (ref 31.5–36.0)
MCV: 84.5 fL (ref 79.5–101.0)
MONO%: 14.1 % — ABNORMAL HIGH (ref 0.0–14.0)
NEUT%: 57.7 % (ref 38.4–76.8)
Platelets: 313 10*3/uL (ref 145–400)

## 2011-05-03 LAB — COMPREHENSIVE METABOLIC PANEL
ALT: 39 U/L — ABNORMAL HIGH (ref 0–35)
BUN: 11 mg/dL (ref 6–23)
CO2: 29 mEq/L (ref 19–32)
Calcium: 8.1 mg/dL — ABNORMAL LOW (ref 8.4–10.5)
Chloride: 101 mEq/L (ref 96–112)
Creatinine, Ser: 0.84 mg/dL (ref 0.50–1.10)

## 2011-05-03 LAB — IRON AND TIBC: TIBC: 531 ug/dL — ABNORMAL HIGH (ref 250–470)

## 2011-05-03 LAB — LACTATE DEHYDROGENASE: LDH: 124 U/L (ref 94–250)

## 2011-05-04 ENCOUNTER — Encounter (HOSPITAL_COMMUNITY): Payer: Self-pay | Admitting: Emergency Medicine

## 2011-05-04 ENCOUNTER — Telehealth: Payer: Self-pay | Admitting: Medical Oncology

## 2011-05-04 ENCOUNTER — Inpatient Hospital Stay (HOSPITAL_COMMUNITY)
Admission: EM | Admit: 2011-05-04 | Discharge: 2011-05-09 | DRG: 378 | Disposition: A | Discharge status: Home / Self Care | Payer: Medicare Other | Attending: Gastroenterology | Admitting: Gastroenterology

## 2011-05-04 ENCOUNTER — Emergency Department (HOSPITAL_COMMUNITY): Payer: Medicare Other

## 2011-05-04 ENCOUNTER — Other Ambulatory Visit: Payer: Self-pay

## 2011-05-04 DIAGNOSIS — K31811 Angiodysplasia of stomach and duodenum with bleeding: Principal | ICD-10-CM | POA: Diagnosis present

## 2011-05-04 DIAGNOSIS — B1920 Unspecified viral hepatitis C without hepatic coma: Secondary | ICD-10-CM | POA: Diagnosis present

## 2011-05-04 DIAGNOSIS — S78119A Complete traumatic amputation at level between unspecified hip and knee, initial encounter: Secondary | ICD-10-CM

## 2011-05-04 DIAGNOSIS — K922 Gastrointestinal hemorrhage, unspecified: Secondary | ICD-10-CM

## 2011-05-04 DIAGNOSIS — D62 Acute posthemorrhagic anemia: Secondary | ICD-10-CM | POA: Diagnosis present

## 2011-05-04 DIAGNOSIS — K449 Diaphragmatic hernia without obstruction or gangrene: Secondary | ICD-10-CM | POA: Diagnosis present

## 2011-05-04 DIAGNOSIS — D649 Anemia, unspecified: Secondary | ICD-10-CM

## 2011-05-04 HISTORY — DX: Essential (primary) hypertension: I10

## 2011-05-04 HISTORY — DX: Polyp of colon: K63.5

## 2011-05-04 LAB — POCT I-STAT TROPONIN I: Troponin i, poc: 0 ng/mL (ref 0.00–0.08)

## 2011-05-04 LAB — URINALYSIS, ROUTINE W REFLEX MICROSCOPIC
Glucose, UA: NEGATIVE mg/dL
Ketones, ur: NEGATIVE mg/dL
Leukocytes, UA: NEGATIVE
Specific Gravity, Urine: 1.009 (ref 1.005–1.030)
pH: 6 (ref 5.0–8.0)

## 2011-05-04 LAB — DIFFERENTIAL
Basophils Relative: 0 % (ref 0–1)
Lymphs Abs: 1.5 10*3/uL (ref 0.7–4.0)
Monocytes Absolute: 0.9 10*3/uL (ref 0.1–1.0)
Monocytes Relative: 12 % (ref 3–12)
Neutro Abs: 4.5 10*3/uL (ref 1.7–7.7)

## 2011-05-04 LAB — COMPREHENSIVE METABOLIC PANEL
ALT: 38 U/L — ABNORMAL HIGH (ref 0–35)
CO2: 27 mEq/L (ref 19–32)
Calcium: 8.6 mg/dL (ref 8.4–10.5)
Chloride: 101 mEq/L (ref 96–112)
Creatinine, Ser: 0.82 mg/dL (ref 0.50–1.10)
GFR calc Af Amer: 90 mL/min — ABNORMAL LOW (ref >90)
GFR calc non Af Amer: 77 mL/min — ABNORMAL LOW (ref >90)
Glucose, Bld: 97 mg/dL (ref 70–99)
Total Bilirubin: 0.1 mg/dL — ABNORMAL LOW (ref 0.3–1.2)

## 2011-05-04 LAB — CBC
HCT: 24.6 % — ABNORMAL LOW (ref 36.0–46.0)
Hemoglobin: 7.7 g/dL — ABNORMAL LOW (ref 12.0–15.0)
MCH: 26.3 pg (ref 26.0–34.0)
MCHC: 31.3 g/dL (ref 30.0–36.0)

## 2011-05-04 LAB — ABO/RH: ABO/RH(D): AB POS

## 2011-05-04 LAB — OCCULT BLOOD, POC DEVICE: Fecal Occult Bld: POSITIVE

## 2011-05-04 MED ORDER — SODIUM CHLORIDE 0.45 % IV SOLN: Freq: Once | INTRAVENOUS | Status: DC

## 2011-05-04 MED ORDER — PNEUMOCOCCAL VAC POLYVALENT 25 MCG/0.5ML IJ INJ
0.5000 mL | INJECTION | INTRAMUSCULAR | Status: AC
  Administered 2011-05-05: 0.5 mL via INTRAMUSCULAR
  Filled 2011-05-04 (×2): qty 0.5

## 2011-05-04 MED ORDER — SODIUM CHLORIDE 0.9 % IV SOLN
80.0000 mg | Freq: Once | INTRAVENOUS | Status: AC
  Administered 2011-05-04: 80 mg via INTRAVENOUS
  Filled 2011-05-04: qty 80

## 2011-05-04 MED ORDER — PANTOPRAZOLE SODIUM 40 MG IV SOLR
8.0000 mg/h | INTRAVENOUS | Status: DC
  Administered 2011-05-04 – 2011-05-05 (×2): 8 mg/h via INTRAVENOUS
  Filled 2011-05-04 (×7): qty 80

## 2011-05-04 MED ORDER — GABAPENTIN 300 MG PO CAPS
600.0000 mg | ORAL_CAPSULE | Freq: Three times a day (TID) | ORAL | Status: DC
  Administered 2011-05-04 – 2011-05-08 (×12): 600 mg via ORAL
  Filled 2011-05-04 (×16): qty 2

## 2011-05-04 MED ORDER — SODIUM CHLORIDE 0.9 % IV SOLN
1020.0000 mg | Freq: Once | INTRAVENOUS | Status: AC
  Administered 2011-05-04: 1020 mg via INTRAVENOUS
  Filled 2011-05-04: qty 34

## 2011-05-04 NOTE — Discharge Planning (Signed)
ED CM reviewed EPIC Spoke with Dr [name] [phone]) to request admission status clarification

## 2011-05-04 NOTE — Telephone Encounter (Signed)
I spoke with Dr. Arline Asp regarding pt and he would like for her to go to the ER to evaluated due to the drastic drop in ferritin and her Hgb. She states that she has been eating bananas and spaghetti that has made her stools dark. I explained this has nothing to do the foods she has eaten. I explained she can get into serious trouble if she continues to bleed. She agreed to go to the ER. Dr. Arline Asp notified.

## 2011-05-04 NOTE — ED Notes (Signed)
Pt presenting to ed with c/o sent by pcp for low hemoglobin. Pt states history of anemia. Pt states generalized weakness. Pt states sent by Md Deatra Canter

## 2011-05-04 NOTE — Telephone Encounter (Signed)
I called pt per Dr. Arline Asp to let her know that needs to get IV Iron today. I asked if she is bleeding due to the drop in her HGB. She states that her stools are very dark. She states she is very tired and a little short of breath. Will discuss with Dr. Arline Asp and call her back.

## 2011-05-04 NOTE — ED Provider Notes (Signed)
History     CSN: [id number] Arrival date & time: 05/04/2011 10:50 AM   First MD Initiated Contact with Patient 05/04/11 1217      Chief Complaint  Patient presents with  . Weakness    Anemia    (Consider location/radiation/quality/duration/timing/severity/associated sxs/prior treatment) HPI Patient is a 60 yo F with a history of iron-deficiency anemia as well as GI bleeding.  She was sent in by Dr. Arline Asp who intermittently transfuses the patient with PRBCs and iron. She was sent for a hemoglobin of 7.7. The patient has had dark stools.  She endorses generalized weakness and shortness of breath.  She denies chest pain and lightheadedness.  There are no other associated or modifying factors.  Past Medical History  Diagnosis Date  . Anemia, iron deficiency 05/03/2011  . Anemia   . Hypertension   . Colon polyps   . Stomach problems   . Hypokalemic alkalosis     Past Surgical History  Procedure Date  . Leg amputation above knee     History reviewed. No pertinent family history.  History  Substance Use Topics  . Smoking status: Current Some Day Smoker  . Smokeless tobacco: Never Used  . Alcohol Use: Yes     occasionally    OB History    Grav Para Term Preterm Abortions TAB SAB Ect Mult Living                  Review of Systems  Constitutional: Positive for fatigue.  HENT: Negative.   Eyes: Negative.   Respiratory: Positive for shortness of breath.   Cardiovascular: Negative.   Gastrointestinal: Positive for abdominal pain and blood in stool.  Genitourinary: Negative.   Musculoskeletal: Negative.   Neurological: Negative.   Hematological: Bruises/bleeds easily.  Psychiatric/Behavioral: Negative.   All other systems reviewed and are negative.    Allergies  Codeine and Penicillins  Home Medications  No current outpatient prescriptions on file.  BP 135/80  Pulse 80  Temp(Src) 97.4 F (36.3 C) (Oral)  Resp 16  Ht 5\' 3"  (1.6 m)  Wt 181 lb 6.4 oz  (82.283 kg)  BMI 32.13 kg/m2  SpO2 94%  Physical Exam  Nursing note and vitals reviewed. Constitutional: She is oriented to person, place, and time. She appears well-developed and well-nourished. No distress.  HENT:  Head: Normocephalic and atraumatic.  Eyes: Conjunctivae and EOM are normal. Pupils are equal, round, and reactive to light.  Neck: Normal range of motion.  Cardiovascular: Normal rate, regular rhythm, normal heart sounds and intact distal pulses.  Exam reveals no gallop and no friction rub.   No murmur heard. Pulmonary/Chest: Effort normal and breath sounds normal. No respiratory distress. She has no wheezes. She has no rales.  Abdominal: Soft. Bowel sounds are normal. She exhibits no distension. There is tenderness. There is no rebound and no guarding.       Diffuse moderate tenderness  Genitourinary: Guaiac positive stool.  Musculoskeletal: Normal range of motion. She exhibits no edema and no tenderness.  Neurological: She is alert and oriented to person, place, and time. No cranial nerve deficit. She exhibits normal muscle tone. Coordination normal.  Skin: Skin is warm and dry. No rash noted.  Psychiatric: She has a normal mood and affect.    ED Course  Procedures (including critical care time)  Date: 05/04/2011  Rate: 79  Rhythm: normal sinus rhythm  QRS Axis: normal  Intervals: normal  ST/T Wave abnormalities: normal  Conduction Disutrbances:first-degree A-V block  Narrative Interpretation:   Old EKG Reviewed: patient now with first degree AV block  Labs Reviewed  CBC - Abnormal; Notable for the following:    RBC 2.93 (*)    Hemoglobin 7.7 (*)    HCT 24.6 (*)    RDW 16.0 (*)    All other components within normal limits  COMPREHENSIVE METABOLIC PANEL - Abnormal; Notable for the following:    Albumin 3.1 (*)    AST 77 (*)    ALT 38 (*)    Total Bilirubin 0.1 (*)    GFR calc non Af Amer 77 (*)    GFR calc Af Amer 90 (*)    All other components within  normal limits  LIPASE, BLOOD - Abnormal; Notable for the following:    Lipase 156 (*)    All other components within normal limits  DIFFERENTIAL  PROTIME-INR  URINALYSIS, ROUTINE W REFLEX MICROSCOPIC  TYPE AND SCREEN  OCCULT BLOOD, POC DEVICE  POCT I-STAT TROPONIN I  PREPARE RBC (CROSSMATCH)  ABO/RH  POCT OCCULT BLOOD STOOL, DEVICE  I-STAT TROPONIN I   Dg Chest 2 View  05/04/2011  *RADIOLOGY REPORT*  Clinical Data: Shortness of breath, cough.  CHEST - 2 VIEW  Comparison: 02/02/2011  Findings: Linear densities in the lingula, unchanged, likely scarring.  Right lung is clear.  Heart is borderline in size.  No effusions or acute bony abnormality.  IMPRESSION: Lingular scarring.  No active disease.  No change.  Original Report Authenticated By: Cyndie Chime, M.D.   CRITICAL CARE Performed by: Cyndra Numbers   Total critical care time: 30 minutes  Critical care time was exclusive of separately billable procedures and treating other patients.  Critical care was necessary to treat or prevent imminent or life-threatening deterioration.  Critical care was time spent personally by me on the following activities: development of treatment plan with patient and/or surrogate as well as nursing, discussions with consultants, evaluation of patient's response to treatment, examination of patient, obtaining history from patient or surrogate, ordering and performing treatments and interventions, ordering and review of laboratory studies, ordering and review of radiographic studies, pulse oximetry and re-evaluation of patient's condition.   1. GI bleeding   2. Anemia       MDM  Patient was evaluated and was hemodynamically stable.  She was worked up for her presumed symptomatic anemia that was likely associated with GI bleeding based on history. Stool was guaiac positive.She was started on Protonix bolus and infusion.  Her ECG was unchanged and her metabolic panel and TNI were unremarkable.  CBC  confirmed hemoglobin of 7.7 and patient was typed and crossed for 4 units.  I contacted Dr. Arline Asp who recommended transfusion with IV iron.  This was ordered and I contacted Jeani Hawking the patient's gastroenterologist.  He accepted the patient for admission.  The patient was admitted to stepdown bed.      Cyndra Numbers, MD 05/04/11 2157

## 2011-05-04 NOTE — H&P (Signed)
Reason for Admission:  Symptomatic anemia and history of AVMs   [name] HPI: This a 60 year old female who is well-known to me with a history of bleeding duodenal AVMs.  She reports that within the past 1-2 days she has a recurrence of her melena.  Concurrently she feels very weak and tired.  There is SOB with her symptoms and she has some vague right-sided chest pain.  No nausea, vomiting, hematochezia, diarrhea, or constipation.  Dr. Arline Asp recommended that she present to the ER when was identified to have a rapid and significant decline in her ferritin again.  The last episode of her severe drop in ferritin was approximately 3 months ago.  At that time a repeat EGD identified a couple of proximal duodenal AVMs.  They were ablated with APC and hemoclipped on 02/07/2011.  She did not have any bleeding until now.  Past Medical History  Diagnosis Date  . Anemia, iron deficiency 05/03/2011  . Anemia   . Hypertension   . Colon polyps   . Stomach problems   . Hypokalemic alkalosis     Past Surgical History  Procedure Date  . Leg amputation above knee     History reviewed. No pertinent family history.  Social History:  reports that she has been smoking.  She does not have any smokeless tobacco history on file. She reports that she drinks alcohol. She reports that she uses illicit drugs (Marijuana).  Allergies:  Allergies  Allergen Reactions  . Codeine Other (See Comments)    Reaction=swelling  . Gabapentin     Patient states no allergy to this medication**  . Penicillins Other (See Comments)    Reaction=swelling    Medications:  Scheduled:   . ferumoxytol  1,020 mg Intravenous Once  . pantoprazole (PROTONIX) IV  80 mg Intravenous Once   Continuous:   . pantoprozole (PROTONIX) infusion      Results for orders placed during the hospital encounter of 05/04/11 (from the past 24 hour(s))  TYPE AND SCREEN     Status: Normal (Preliminary result)   Collection Time   05/04/11 11:15 AM      Component Value Range   ABO/RH(D) AB POS     Antibody Screen NEG     Sample Expiration 05/07/2011     Unit Number [id number]     Blood Component Type RED CELLS,LR     Unit division 00     Status of Unit ALLOCATED     Transfusion Status OK TO TRANSFUSE     Crossmatch Result Compatible     Unit Number [id number]     Blood Component Type RED CELLS,LR     Unit division 00     Status of Unit ALLOCATED     Transfusion Status OK TO TRANSFUSE     Crossmatch Result Compatible     Unit Number [id number]     Blood Component Type RED CELLS,LR     Unit division 00     Status of Unit ALLOCATED     Transfusion Status OK TO TRANSFUSE     Crossmatch Result Compatible     Unit Number [id number]     Blood Component Type RED CELLS,LR     Unit division 00     Status of Unit ALLOCATED     Transfusion Status OK TO TRANSFUSE     Crossmatch Result Compatible    COMPREHENSIVE METABOLIC PANEL     Status: Abnormal   Collection Time   05/04/11 11:15  AM      Component Value Range   Sodium 136  135 - 145 (mEq/L)   Potassium 4.3  3.5 - 5.1 (mEq/L)   Chloride 101  96 - 112 (mEq/L)   CO2 27  19 - 32 (mEq/L)   Glucose, Bld 97  70 - 99 (mg/dL)   BUN 11  6 - 23 (mg/dL)   Creatinine, Ser 1.61  0.50 - 1.10 (mg/dL)   Calcium 8.6  8.4 - 09.6 (mg/dL)   Total Protein 7.2  6.0 - 8.3 (g/dL)   Albumin 3.1 (*) 3.5 - 5.2 (g/dL)   AST 77 (*) 0 - 37 (U/L)   ALT 38 (*) 0 - 35 (U/L)   Alkaline Phosphatase 97  39 - 117 (U/L)   Total Bilirubin 0.1 (*) 0.3 - 1.2 (mg/dL)   GFR calc non Af Amer 77 (*) >90 (mL/min)   GFR calc Af Amer 90 (*) >90 (mL/min)  LIPASE, BLOOD     Status: Abnormal   Collection Time   05/04/11 11:15 AM      Component Value Range   Lipase 156 (*) 11 - 59 (U/L)  ABO/RH     Status: Normal   Collection Time   05/04/11 12:39 PM      Component Value Range   ABO/RH(D) AB POS    CBC     Status: Abnormal   Collection Time   05/04/11 12:55 PM      Component Value Range   WBC 7.0  4.0  - 10.5 (K/uL)   RBC 2.93 (*) 3.87 - 5.11 (MIL/uL)   Hemoglobin 7.7 (*) 12.0 - 15.0 (g/dL)   HCT 04.5 (*) 40.9 - 46.0 (%)   MCV 84.0  78.0 - 100.0 (fL)   MCH 26.3  26.0 - 34.0 (pg)   MCHC 31.3  30.0 - 36.0 (g/dL)   RDW 81.1 (*) 91.4 - 15.5 (%)   Platelets 390  150 - 400 (K/uL)  DIFFERENTIAL     Status: Normal   Collection Time   05/04/11 12:55 PM      Component Value Range   Neutrophils Relative 64  43 - 77 (%)   Neutro Abs 4.5  1.7 - 7.7 (K/uL)   Lymphocytes Relative 22  12 - 46 (%)   Lymphs Abs 1.5  0.7 - 4.0 (K/uL)   Monocytes Relative 12  3 - 12 (%)   Monocytes Absolute 0.9  0.1 - 1.0 (K/uL)   Eosinophils Relative 2  0 - 5 (%)   Eosinophils Absolute 0.1  0.0 - 0.7 (K/uL)   Basophils Relative 0  0 - 1 (%)   Basophils Absolute 0.0  0.0 - 0.1 (K/uL)  PROTIME-INR     Status: Normal   Collection Time   05/04/11 12:55 PM      Component Value Range   Prothrombin Time 13.8  11.6 - 15.2 (seconds)   INR 1.04  0.00 - 1.49   OCCULT BLOOD, POC DEVICE     Status: Normal   Collection Time   05/04/11 12:58 PM      Component Value Range   Fecal Occult Bld POSITIVE    POCT I-STAT TROPONIN I     Status: Normal   Collection Time   05/04/11  1:12 PM      Component Value Range   Troponin i, poc 0.00  0.00 - 0.08 (ng/mL)   Comment 3           PREPARE RBC (CROSSMATCH)  Status: Normal   Collection Time   05/04/11  2:00 PM      Component Value Range   Order Confirmation ORDER PROCESSED BY BLOOD BANK       Dg Chest 2 View  05/04/2011  *RADIOLOGY REPORT*  Clinical Data: Shortness of breath, cough.  CHEST - 2 VIEW  Comparison: 02/02/2011  Findings: Linear densities in the lingula, unchanged, likely scarring.  Right lung is clear.  Heart is borderline in size.  No effusions or acute bony abnormality.  IMPRESSION: Lingular scarring.  No active disease.  No change.  Original Report Authenticated By: Cyndie Chime, M.D.    ROS:  As stated above in the HPI otherwise negative.  Blood pressure  112/69, pulse 91, temperature 98.7 F (37.1 C), temperature source Oral, resp. rate 14, SpO2 100.00%.    PE: Gen: NAD, Alert and Oriented HEENT:  [place]/AT, EOMI Neck: Supple, no LAD Lungs: CTA Bilaterally CV: RRR without M/G/R ABM: Soft, NTND, +BS Ext: No C/C/E  Assessment/Plan: 1) GI bleed. 2) HTN   Most likely the patient is bleeding from her duodenal AVMs again.  Unfortunately she ate and she is very symptomatic from her anemia.  Plan: 1) Transfuse 2 units of PRBC. 2) EGD tomorrow.  Lataria Courser D 05/04/2011, 2:33 PM

## 2011-05-05 ENCOUNTER — Encounter (HOSPITAL_COMMUNITY): Payer: Self-pay | Admitting: *Deleted

## 2011-05-05 ENCOUNTER — Encounter (HOSPITAL_COMMUNITY)
Admission: EM | Disposition: A | Discharge status: Home / Self Care | Payer: Self-pay | Source: Home / Self Care | Attending: Gastroenterology

## 2011-05-05 DIAGNOSIS — D6489 Other specified anemias: Secondary | ICD-10-CM

## 2011-05-05 DIAGNOSIS — K921 Melena: Secondary | ICD-10-CM

## 2011-05-05 HISTORY — PX: ESOPHAGOGASTRODUODENOSCOPY: SHX5428

## 2011-05-05 LAB — CBC
MCH: 26.4 pg (ref 26.0–34.0)
MCV: 81.7 fL (ref 78.0–100.0)
Platelets: 365 10*3/uL (ref 150–400)
RDW: 16.1 % — ABNORMAL HIGH (ref 11.5–15.5)

## 2011-05-05 SURGERY — EGD (ESOPHAGOGASTRODUODENOSCOPY)
Anesthesia: Moderate Sedation

## 2011-05-05 MED ORDER — VITAMINS A & D EX OINT
TOPICAL_OINTMENT | CUTANEOUS | Status: AC
  Filled 2011-05-05: qty 5

## 2011-05-05 MED ORDER — LISINOPRIL 40 MG PO TABS
40.0000 mg | ORAL_TABLET | Freq: Every day | ORAL | Status: DC
  Administered 2011-05-06 – 2011-05-07 (×2): 40 mg via ORAL
  Filled 2011-05-05 (×4): qty 1

## 2011-05-05 MED ORDER — TRAMADOL HCL 50 MG PO TABS
50.0000 mg | ORAL_TABLET | Freq: Four times a day (QID) | ORAL | Status: DC | PRN
  Administered 2011-05-06 – 2011-05-09 (×7): 50 mg via ORAL
  Filled 2011-05-05 (×7): qty 1

## 2011-05-05 MED ORDER — MIDAZOLAM HCL 10 MG/2ML IJ SOLN
INTRAMUSCULAR | Status: DC | PRN
  Administered 2011-05-05: 2 mg via INTRAVENOUS
  Administered 2011-05-05 (×2): 1 mg via INTRAVENOUS
  Administered 2011-05-05: 2 mg via INTRAVENOUS

## 2011-05-05 MED ORDER — PANTOPRAZOLE SODIUM 40 MG PO TBEC
40.0000 mg | DELAYED_RELEASE_TABLET | Freq: Every day | ORAL | Status: DC
  Administered 2011-05-07 – 2011-05-09 (×3): 40 mg via ORAL
  Filled 2011-05-05 (×4): qty 1

## 2011-05-05 MED ORDER — FENTANYL NICU IV SYRINGE 50 MCG/ML
INJECTION | INTRAMUSCULAR | Status: DC | PRN
  Administered 2011-05-05 (×2): 12.5 ug via INTRAVENOUS
  Administered 2011-05-05 (×2): 25 ug via INTRAVENOUS

## 2011-05-05 MED ORDER — TRAMADOL 5 MG/ML ORAL SUSPENSION
50.0000 mg | Freq: Four times a day (QID) | ORAL | Status: DC | PRN
  Filled 2011-05-05: qty 10

## 2011-05-05 MED ORDER — SERTRALINE HCL 25 MG PO TABS
25.0000 mg | ORAL_TABLET | Freq: Every day | ORAL | Status: DC
  Administered 2011-05-05 – 2011-05-07 (×3): 25 mg via ORAL
  Filled 2011-05-05 (×5): qty 1

## 2011-05-05 MED ORDER — BUTAMBEN-TETRACAINE-BENZOCAINE 2-2-14 % EX AERO
INHALATION_SPRAY | CUTANEOUS | Status: DC | PRN
  Administered 2011-05-05: 2 via TOPICAL

## 2011-05-05 NOTE — Op Note (Signed)
[facility] [address] [place], [place]  [address]  ENDOSCOPY PROCEDURE REPORT  PATIENT:  [name], [name]  MR#:  [id number] BIRTHDATE:  09-07-1950, 60 yrs. old  GENDER:  female ENDOSCOPIST:  Carie Caddy. Pyrtle, MD Referred by:  Jeani Hawking, M.D. PROCEDURE DATE:  05/05/2011 PROCEDURE:  EGD, diagnostic 43235 ASA CLASS:  Class II INDICATIONS:  melena, anemia MEDICATIONS:   Fentanyl 75 mcg IV, Versed 7 mg IV TOPICAL ANESTHETIC:  Cetacaine Spray  DESCRIPTION OF PROCEDURE:   After the risks benefits and alternatives of the procedure were thoroughly explained, informed consent was obtained.  The  endoscope was introduced through the mouth and advanced to the third portion of the duodenum, without limitations.  The instrument was slowly withdrawn as the mucosa was fully examined. <<PROCEDUREIMAGES>>  The esophagus and gastroesophageal junction were completely normal in appearance, except for a slightly irregular Z-line  A hiatal hernia was found.  Otherwise normal stomach.  The duodenal bulb was normal in appearance, as was the postbulbar duodenum to approximately the 3rd portion of the duodenum.  No angioectasia or evidence of recent bleeding was found. Retroflexed views revealed a hiatal hernia.    The scope was then withdrawn from the patient and the procedure completed.  COMPLICATIONS:  None  ENDOSCOPIC IMPRESSION: 1) Normal esophagus 2) Hiatal hernia 3) Otherwise normal stomach 4) Normal duodenum 5) No explanation for recent GI bleeding.  RECOMMENDATIONS: 1) Recheck CBC to ensure improvement after transfusion. 2) No active bleeding at present, but no source found to explain anemia/melena. 3) Will review previous procedures, including colonscopies.  If colonoscopy done recently and unremarkable, then will proceed with video capsule to visualize more distal small bowel.  VCE could be done as an outpatient if counts improve and no further active bleeding.  Carie Caddy. Rhea Belton, MD  CC:  Jeani Hawking, MD  n. Rosalie DoctorCarie Caddy. Pyrtle at 05/05/2011 01:51 PM  Claiborne Billings, [phone]

## 2011-05-05 NOTE — Interval H&P Note (Signed)
History and Physical Interval Note:  05/05/2011 1:16 PM  [name]  has presented today for surgery, with the diagnosis of upper gastrointestinal bleed  The various methods of treatment have been discussed with the patient and family. After consideration of risks, benefits and other options for treatment, the patient has consented to  Procedure(s): ESOPHAGOGASTRODUODENOSCOPY (EGD) as a surgical intervention .  The patients' history has been reviewed, patient examined, no change in status, stable for surgery.  I have reviewed the patients' chart and labs.  Questions were answered to the patient's satisfaction.     Nikyah Lackman M

## 2011-05-06 DIAGNOSIS — K921 Melena: Secondary | ICD-10-CM

## 2011-05-06 DIAGNOSIS — D6489 Other specified anemias: Secondary | ICD-10-CM

## 2011-05-06 LAB — CBC
Platelets: 369 10*3/uL (ref 150–400)
RDW: 16.1 % — ABNORMAL HIGH (ref 11.5–15.5)
WBC: 8.3 10*3/uL (ref 4.0–10.5)

## 2011-05-06 LAB — BASIC METABOLIC PANEL
Calcium: 8.7 mg/dL (ref 8.4–10.5)
Creatinine, Ser: 0.8 mg/dL (ref 0.50–1.10)
GFR calc Af Amer: >90 mL/min (ref >90)
GFR calc non Af Amer: 80 mL/min — ABNORMAL LOW (ref >90)
Sodium: 135 mEq/L (ref 135–145)

## 2011-05-06 MED ORDER — POTASSIUM CHLORIDE CRYS ER 20 MEQ PO TBCR
20.0000 meq | EXTENDED_RELEASE_TABLET | Freq: Every day | ORAL | Status: DC
  Administered 2011-05-06 – 2011-05-07 (×2): 20 meq via ORAL
  Filled 2011-05-06 (×4): qty 1

## 2011-05-06 NOTE — Progress Notes (Signed)
Patient seen and I agree with the above documentation, including the assessment and plan. No source for bleeding found yesterday at EGD Pt reports 2 prior capsule studies, the results of which I do not have. Dr. [name], who knows her history well, can determine how best to proceed from here. No overt rebleeding and HCT is stable after transfusion. Follow HCT in am.

## 2011-05-06 NOTE — Progress Notes (Signed)
     [facility] Daily Rounding Note 05/06/2011, 8:54 AM  SUBJECTIVE: One melenic stool yesterday evening.  Still has discomfort in abdomen, mostly in left UQ.  Eating regular food.  Fatigue greatly improved.  Pt says she has had two capsule endos, colonoscopy an prior EGDs. OBJECTIVE: Looks well.   Vital signs in last 24 hours: Temp:  [98.2 F (36.8 C)-98.9 F (37.2 C)] 98.9 F (37.2 C) (12/09 0634) Pulse Rate:  [72-89] 78  (12/09 0634) Resp:  [13-18] 18  (12/09 0634) BP: (122-162)/(74-94) 132/83 mmHg (12/09 0634) SpO2:  [91 %-96 %] 93 % (12/09 0634) Last BM Date: 05/04/11  Heart: RRR Chest: Clear B Abdomen: discomfort at left UQ.  No guard or rebound  Extremities: left AKA, no edema on right Neuro/Psych:  Talkative and pleasant.  Intake/Output from previous day: 12/08 0701 - 12/09 0700 In: 1055 [P.O.:480; I.V.:575] Out: -   Lab Results:  Basename 05/06/11 0616 05/05/11 1439 05/04/11 1255  WBC 8.3 6.1 7.0  HGB 9.9* 10.2* 7.7*  HCT 30.7* 31.6* 24.6*  PLT 369 365 390   BMET  Basename 05/06/11 0616 05/04/11 1115 05/03/11 1140  NA 135 136 135135  K 4.3 4.3 4.44.4  CL 99 101 101101  CO2 29 27 2929  GLUCOSE 107* 97 8484  BUN 10 11 1111  CREATININE 0.80 0.82 0.840.84  CALCIUM 8.7 8.6 8.1*8.1*   LFT  Basename 05/04/11 1115  PROT 7.2  ALBUMIN 3.1*  AST 77*  ALT 38*  ALKPHOS 97  BILITOT 0.1*  BILIDIR --  IBILI --   PT/INR  Basename 05/04/11 1255  LABPROT 13.8  INR 1.04    Studies/Results: Dg Chest 2 View  05/04/2011  *RADIOLOGY REPORT*  Clinical Data: Shortness of breath, cough.  CHEST - 2 VIEW  Comparison: 02/02/2011  Findings: Linear densities in the lingula, unchanged, likely scarring.  Right lung is clear.  Heart is borderline in size.  No effusions or acute bony abnormality.  IMPRESSION: Lingular scarring.  No active disease.  No change.  Original Report Authenticated By: Cyndie Chime, M.D.    ASSESMENT: 1.  GI bleed.  2.  ABL anemia.  S/p 2 units prbc with h & h further improved since the transfusion  3.  Hepatitis C.  AST/ALT elevated as they were on Feb 02, 2011.(82,44 then) 4.  Left sided abd discomfort, not acute. 5.  S/p left AKA, necrotizing fascitis  PLAN: 1.  Dr [name] will determine next step to treat:  ? Push enteroscopy  Vs repeat capsule study>   LOS: 2 days   Jennye Moccasin  05/06/2011, 8:54 AM Pager: [phone]

## 2011-05-07 ENCOUNTER — Encounter (HOSPITAL_COMMUNITY): Payer: Self-pay | Admitting: Internal Medicine

## 2011-05-07 LAB — BASIC METABOLIC PANEL
BUN: 10 mg/dL (ref 6–23)
Calcium: 8.8 mg/dL (ref 8.4–10.5)
GFR calc Af Amer: >90 mL/min (ref >90)
GFR calc non Af Amer: >90 mL/min (ref >90)
Glucose, Bld: 128 mg/dL — ABNORMAL HIGH (ref 70–99)
Potassium: 3.9 mEq/L (ref 3.5–5.1)
Sodium: 135 mEq/L (ref 135–145)

## 2011-05-07 LAB — CBC
Hemoglobin: 9.7 g/dL — ABNORMAL LOW (ref 12.0–15.0)
MCH: 26.2 pg (ref 26.0–34.0)
MCV: 82.4 fL (ref 78.0–100.0)
Platelets: 349 10*3/uL (ref 150–400)
RBC: 3.7 MIL/uL — ABNORMAL LOW (ref 3.87–5.11)
RDW: 16.2 % — ABNORMAL HIGH (ref 11.5–15.5)
WBC: 9.4 10*3/uL (ref 4.0–10.5)

## 2011-05-07 MED ORDER — TRAZODONE HCL 100 MG PO TABS
100.0000 mg | ORAL_TABLET | Freq: Every day | ORAL | Status: DC
  Administered 2011-05-07 – 2011-05-08 (×2): 100 mg via ORAL
  Filled 2011-05-07 (×3): qty 1

## 2011-05-07 NOTE — Progress Notes (Signed)
Patient ID: [name], female   DOB: [birth year], 60 y.o.   MRN: [id number] Subjective: Patient complains of musculoskeletal pain in her shoulders.  Objective: Vital signs in last 24 hours: Temp:  [97.6 F (36.4 C)-98.7 F (37.1 C)] 97.6 F (36.4 C) (12/10 0500) Pulse Rate:  [80] 80  (12/10 0500) Resp:  [18] 18  (12/10 0500) BP: (136-148)/(81-89) 148/89 mmHg (12/10 0500) SpO2:  [91 %-99 %] 91 % (12/10 0500) Last BM Date: 05/05/11  Intake/Output from previous day: 12/09 0701 - 12/10 0700 In: 480 [P.O.:480] Out: -  Intake/Output this shift:    General appearance: alert and no distress Resp: clear to auscultation bilaterally Cardio: regular rate and rhythm, S1, S2 normal, no murmur, click, rub or gallop GI: soft, non-tender; bowel sounds normal; no masses,  no organomegaly Extremities: extremities normal, atraumatic, no cyanosis or edema  Lab Results:  [facility] 05/07/11 0506 05/06/11 0616 05/05/11 1439  WBC 9.4 8.3 6.1  HGB 9.7* 9.9* 10.2*  HCT 30.5* 30.7* 31.6*  PLT 349 369 365   BMET  Basename 05/06/11 0616 05/04/11 1115  NA 135 136  K 4.3 4.3  CL 99 101  CO2 29 27  GLUCOSE 107* 97  BUN 10 11  CREATININE 0.80 0.82  CALCIUM 8.7 8.6   LFT  Basename 05/04/11 1115  PROT 7.2  ALBUMIN 3.1*  AST 77*  ALT 38*  ALKPHOS 97  BILITOT 0.1*  BILIDIR --  IBILI --   PT/INR  Basename 05/04/11 1255  LABPROT 13.8  INR 1.04   Hepatitis Panel No results found for this basename: HEPBSAG,HCVAB,HEPAIGM,HEPBIGM in the last 72 hours C-Diff No results found for this basename: CDIFFTOX:3 in the last 72 hours Fecal Lactopherrin No results found for this basename: FECLLACTOFRN in the last 72 hours  Studies/Results: No results found.  Medications:  Scheduled:   . gabapentin  600 mg Oral TID  . lisinopril  40 mg Oral Daily  . pantoprazole  40 mg Oral Q0600  . potassium chloride  20 mEq Oral Daily  . sertraline  25 mg Oral Daily  . DISCONTD: sodium chloride    Intravenous Once   Continuous:   Assessment/Plan: 1) Proximal small bowel AVM. 2) S/p GI bleed.   The patient has a difficult to find duodenal AVM.  I will relook again while she is in the hospital and see if I can locate duodenal AVMs.  Plan: 1) EGD tomorrow.  LOS: 3 days   Stephanieann Popescu D 05/07/2011, 8:17 AM

## 2011-05-08 ENCOUNTER — Encounter (HOSPITAL_COMMUNITY): Payer: Self-pay | Admitting: Anesthesiology

## 2011-05-08 ENCOUNTER — Encounter (HOSPITAL_COMMUNITY)
Admission: EM | Disposition: A | Discharge status: Home / Self Care | Payer: Self-pay | Source: Home / Self Care | Attending: Gastroenterology

## 2011-05-08 ENCOUNTER — Encounter (HOSPITAL_COMMUNITY): Payer: Self-pay

## 2011-05-08 HISTORY — PX: ESOPHAGOGASTRODUODENOSCOPY: SHX5428

## 2011-05-08 LAB — CBC
MCH: 26.4 pg (ref 26.0–34.0)
MCHC: 31.6 g/dL (ref 30.0–36.0)
RDW: 16.8 % — ABNORMAL HIGH (ref 11.5–15.5)

## 2011-05-08 SURGERY — EGD (ESOPHAGOGASTRODUODENOSCOPY)
Anesthesia: Moderate Sedation

## 2011-05-08 MED ORDER — POTASSIUM CHLORIDE CRYS ER 20 MEQ PO TBCR
20.0000 meq | EXTENDED_RELEASE_TABLET | Freq: Every day | ORAL | Status: DC
  Administered 2011-05-08 (×2): 20 meq via ORAL
  Filled 2011-05-08 (×2): qty 1

## 2011-05-08 MED ORDER — TRAZODONE HCL 100 MG PO TABS
100.0000 mg | ORAL_TABLET | Freq: Every day | ORAL | Status: DC
  Filled 2011-05-08 (×2): qty 1

## 2011-05-08 MED ORDER — GABAPENTIN 300 MG PO CAPS
600.0000 mg | ORAL_CAPSULE | Freq: Three times a day (TID) | ORAL | Status: DC
  Filled 2011-05-08 (×4): qty 2

## 2011-05-08 MED ORDER — SERTRALINE HCL 25 MG PO TABS
25.0000 mg | ORAL_TABLET | Freq: Every day | ORAL | Status: DC
  Filled 2011-05-08: qty 1

## 2011-05-08 MED ORDER — SODIUM CHLORIDE 0.9 % IV SOLN
INTRAVENOUS | Status: DC
  Administered 2011-05-08: 500 mL via INTRAVENOUS

## 2011-05-08 MED ORDER — FENTANYL NICU IV SYRINGE 50 MCG/ML
INJECTION | INTRAMUSCULAR | Status: DC | PRN
  Administered 2011-05-08 (×2): 25 ug via INTRAVENOUS

## 2011-05-08 MED ORDER — BUTAMBEN-TETRACAINE-BENZOCAINE 2-2-14 % EX AERO
INHALATION_SPRAY | CUTANEOUS | Status: DC | PRN
  Administered 2011-05-08: 2 via TOPICAL

## 2011-05-08 MED ORDER — LISINOPRIL 40 MG PO TABS
40.0000 mg | ORAL_TABLET | Freq: Every day | ORAL | Status: DC
  Administered 2011-05-08: 40 mg via ORAL
  Filled 2011-05-08 (×2): qty 1

## 2011-05-08 MED ORDER — MIDAZOLAM HCL 10 MG/2ML IJ SOLN
INTRAMUSCULAR | Status: DC | PRN
  Administered 2011-05-08: 2 mg via INTRAVENOUS
  Administered 2011-05-08: 1 mg via INTRAVENOUS
  Administered 2011-05-08: 2 mg via INTRAVENOUS

## 2011-05-08 NOTE — Interval H&P Note (Signed)
History and Physical Interval Note:  05/08/2011 3:25 PM  [name]  has presented today for surgery, with the diagnosis of Bleeding small bowel AVM  The various methods of treatment have been discussed with the patient and family. After consideration of risks, benefits and other options for treatment, the patient has consented to  Procedure(s): ESOPHAGOGASTRODUODENOSCOPY (EGD) HOT HEMOSTASIS (ARGON PLASMA COAGULATION/BICAP) as a surgical intervention .  The patients' history has been reviewed, patient examined, no change in status, stable for surgery.  I have reviewed the patients' chart and labs.  Questions were answered to the patient's satisfaction.     [name]

## 2011-05-08 NOTE — Op Note (Signed)
[facility] [address] [place], [place]  [address]  OPERATIVE PROCEDURE REPORT  PATIENT:  Reeanna, Acri  MR#:  [id number] BIRTHDATE:  [birth year]  GENDER:  female ENDOSCOPIST:  Jeani Hawking, MD PROCEDURE DATE:  05/08/2011 PROCEDURE:  EGD, diagnostic 19147 ASA CLASS:  Class III INDICATIONS:  History of bleeding AVMs. MEDICATIONS:  Fentanyl 50 mcg IV, Versed 5 mg IV  DESCRIPTION OF PROCEDURE:   After the risks benefits and alternatives of the procedure were thoroughly explained, informed consent was obtained.  The Pentax Gastroscope Therapeutic [id number] endoscope was introduced through the mouth and advanced to the second portion of the duodenum, without limitations.  The instrument was slowly withdrawn as the mucosa was fully examined. <<PROCEDUREIMAGES>>  normal otherwise. The area of the known AVMs in the transition from D1 to D2 was evaluated closely multiple times. Despite moving the folds there was no evidence of any AVMs. No evidence of any active bleeding or old blood.    Retroflexed views revealed no abnormalities.    The scope was then withdrawn from the patient and the procedure terminated.  COMPLICATIONS:  None  IMPRESSION:  1) Normal EGD.  No evidence of AVMs during this examination. RECOMMENDATIONS:  1) Follow HGB. 2) Iron supplementation.  ______________________________ Jeani Hawking, MD  CC:  Kimberlee Nearing, M.D.  n. eSIGNED:   Jeani Hawking at 05/08/2011 03:52 PM  Claiborne Billings, [phone]

## 2011-05-08 NOTE — Progress Notes (Signed)
05/08/11 2137 Pt removed left upper (black) telemetry lead and caused a slight skin tear. Pt applied ice to site for "burning". No bleeding or drainage noted. Area is reddened. Will monitor. Tele lead repositioned to new area.

## 2011-05-08 NOTE — H&P (View-Only) (Signed)
Patient ID: [name], female   DOB: 11/29/1950, 60 y.o.   MRN: [id number] Subjective: Patient complains of musculoskeletal pain in her shoulders.  Objective: Vital signs in last 24 hours: Temp:  [97.6 F (36.4 C)-98.7 F (37.1 C)] 97.6 F (36.4 C) (12/10 0500) Pulse Rate:  [80] 80  (12/10 0500) Resp:  [18] 18  (12/10 0500) BP: (136-148)/(81-89) 148/89 mmHg (12/10 0500) SpO2:  [91 %-99 %] 91 % (12/10 0500) Last BM Date: 05/05/11  Intake/Output from previous day: 12/09 0701 - 12/10 0700 In: 480 [P.O.:480] Out: -  Intake/Output this shift:    General appearance: alert and no distress Resp: clear to auscultation bilaterally Cardio: regular rate and rhythm, S1, S2 normal, no murmur, click, rub or gallop GI: soft, non-tender; bowel sounds normal; no masses,  no organomegaly Extremities: extremities normal, atraumatic, no cyanosis or edema  Lab Results:  Basename 05/07/11 0506 05/06/11 0616 05/05/11 1439  WBC 9.4 8.3 6.1  HGB 9.7* 9.9* 10.2*  HCT 30.5* 30.7* 31.6*  PLT 349 369 365   BMET  Basename 05/06/11 0616 05/04/11 1115  NA 135 136  K 4.3 4.3  CL 99 101  CO2 29 27  GLUCOSE 107* 97  BUN 10 11  CREATININE 0.80 0.82  CALCIUM 8.7 8.6   LFT  Basename 05/04/11 1115  PROT 7.2  ALBUMIN 3.1*  AST 77*  ALT 38*  ALKPHOS 97  BILITOT 0.1*  BILIDIR --  IBILI --   PT/INR  Basename 05/04/11 1255  LABPROT 13.8  INR 1.04   Hepatitis Panel No results found for this basename: HEPBSAG,HCVAB,HEPAIGM,HEPBIGM in the last 72 hours C-Diff No results found for this basename: CDIFFTOX:3 in the last 72 hours Fecal Lactopherrin No results found for this basename: FECLLACTOFRN in the last 72 hours  Studies/Results: No results found.  Medications:  Scheduled:   . gabapentin  600 mg Oral TID  . lisinopril  40 mg Oral Daily  . pantoprazole  40 mg Oral Q0600  . potassium chloride  20 mEq Oral Daily  . sertraline  25 mg Oral Daily  . DISCONTD: sodium chloride    Intravenous Once   Continuous:   Assessment/Plan: 1) Proximal small bowel AVM. 2) S/p GI bleed.   The patient has a difficult to find duodenal AVM.  I will relook again while she is in the hospital and see if I can locate duodenal AVMs.  Plan: 1) EGD tomorrow.  LOS: 3 days   Pati Thinnes D 05/07/2011, 8:17 AM   

## 2011-05-09 ENCOUNTER — Encounter (HOSPITAL_COMMUNITY): Payer: Self-pay | Admitting: Gastroenterology

## 2011-05-09 LAB — CBC
HCT: 32.3 % — ABNORMAL LOW (ref 36.0–46.0)
Hemoglobin: 10 g/dL — ABNORMAL LOW (ref 12.0–15.0)
MCHC: 31 g/dL (ref 30.0–36.0)
MCV: 84.8 fL (ref 78.0–100.0)
RDW: 17.6 % — ABNORMAL HIGH (ref 11.5–15.5)
WBC: 8.8 10*3/uL (ref 4.0–10.5)

## 2011-05-09 NOTE — Discharge Summary (Signed)
  Physician Discharge Summary  Patient ID: [name] MRN: [id number] DOB/AGE: 1950-07-13 60 y.o.  Admit date: 05/04/2011 Discharge date: 05/09/2011  Admission Diagnoses: Upper GI Bleed  Discharge Diagnoses: Upper GI Bleed presumably from a duodenal AVM Active Problems:  * No active hospital problems. *    Discharged Condition: good  Hospital Course: The patient was admitted to the hospital and transfused with two units of PRBC.  After the transfusion she felt much better and an EGD performed the next day by Dr. Rhea Belton was unrevealing for any bleeding source.  A repeat EGD was performed by Dr. [name] as he was familiar with the bleeding site, however, after careful inspection no AVMs were identified.  Since her HGB remained stable she was able to be discharged home.  Consults: none  Significant Diagnostic Studies: endoscopy: EGD x 2  Treatments: IV hydration and blood transfusion x 2 units of PRBC.  Discharge Exam: Blood pressure 118/75, pulse 76, temperature 97.5 F (36.4 C), temperature source Oral, resp. rate 15, height 5\' 3"  (1.6 m), weight 82.283 kg (181 lb 6.4 oz), SpO2 93.00%. General appearance: alert and no distress Resp: clear to auscultation bilaterally Cardio: regular rate and rhythm, S1, S2 normal, no murmur, click, rub or gallop GI: soft, non-tender; bowel sounds normal; no masses,  no organomegaly Extremities: extremities normal, atraumatic, no cyanosis or edema  Disposition: Home or Self Care  Discharge Orders    Future Appointments: Provider: Department: Dept Phone: Center:   06/05/2011 3:00 PM Samul Dada, MD [facility] [phone] None   06/05/2011 4:00 PM [facility] [phone] None   07/03/2011 2:00 PM [facility] [phone] None   07/31/2011 3:00 PM [facility] [phone] None   08/28/2011 2:30 PM [facility] [phone] None     Current Discharge Medication List      CONTINUE these medications which have NOT CHANGED   Details  gabapentin (NEURONTIN) 300 MG capsule Take 600 mg by mouth 3 (three) times daily.      lisinopril (PRINIVIL,ZESTRIL) 40 MG tablet Take 40 mg by mouth daily.      omeprazole (PRILOSEC) 40 MG capsule Take 40 mg by mouth daily.      potassium chloride SA (K-DUR,KLOR-CON) 20 MEQ tablet Take 20 mEq by mouth daily.      sertraline (ZOLOFT) 25 MG tablet Take 25 mg by mouth daily.      traMADol (ULTRAM) 50 MG tablet Take 50 mg by mouth every 6 (six) hours as needed. For pain.Maximum dose= 8 tablets per day     traZODone (DESYREL) 100 MG tablet Take 100 mg by mouth at bedtime.         Follow-up Information    Follow up with Lovada Barwick D in 1 week. (Come to the office for a CBC only.)    Contact information:   [address], [address] [place] [address] [phone]          Signed: Theda Belfast 05/09/2011, 7:49 AM

## 2011-05-09 NOTE — Progress Notes (Signed)
Utilization review completed.  

## 2011-05-11 ENCOUNTER — Telehealth: Payer: Self-pay | Admitting: Oncology

## 2011-05-11 NOTE — Telephone Encounter (Signed)
S/w the pt and she is aware of her dec 19th appt  And to pick up the rest of her appts at that time

## 2011-05-16 ENCOUNTER — Ambulatory Visit (HOSPITAL_BASED_OUTPATIENT_CLINIC_OR_DEPARTMENT_OTHER): Payer: Medicare Other

## 2011-05-16 VITALS — BP 124/8 | HR 87 | Temp 98.5°F

## 2011-05-16 DIAGNOSIS — D509 Iron deficiency anemia, unspecified: Secondary | ICD-10-CM

## 2011-05-16 MED ORDER — SODIUM CHLORIDE 0.9 % IV SOLN
Freq: Once | INTRAVENOUS | Status: AC
  Administered 2011-05-16: 16:00:00 via INTRAVENOUS

## 2011-05-16 MED ORDER — FERUMOXYTOL INJECTION 510 MG/17 ML
510.0000 mg | Freq: Once | INTRAVENOUS | Status: AC
  Administered 2011-05-16: 510 mg via INTRAVENOUS
  Filled 2011-05-16: qty 17

## 2011-05-18 ENCOUNTER — Other Ambulatory Visit: Payer: Self-pay | Admitting: Oncology

## 2011-05-18 DIAGNOSIS — D509 Iron deficiency anemia, unspecified: Secondary | ICD-10-CM

## 2011-05-21 ENCOUNTER — Other Ambulatory Visit: Payer: Self-pay | Admitting: Oncology

## 2011-05-23 ENCOUNTER — Ambulatory Visit (HOSPITAL_BASED_OUTPATIENT_CLINIC_OR_DEPARTMENT_OTHER): Payer: Medicare Other

## 2011-05-23 VITALS — BP 157/96 | HR 88 | Temp 97.6°F

## 2011-05-23 DIAGNOSIS — D509 Iron deficiency anemia, unspecified: Secondary | ICD-10-CM

## 2011-05-23 MED ORDER — SODIUM CHLORIDE 0.9 % IV SOLN
Freq: Once | INTRAVENOUS | Status: AC
  Administered 2011-05-23: 10:00:00 via INTRAVENOUS

## 2011-05-23 MED ORDER — FERUMOXYTOL INJECTION 510 MG/17 ML
510.0000 mg | Freq: Once | INTRAVENOUS | Status: DC
  Filled 2011-05-23: qty 17

## 2011-05-31 NOTE — Progress Notes (Signed)
This patient has been followed for iron deficiency anemia felt to be due to GI bleeding.  The patient had received Feraheme back on 07/20, 07/25, 09/10, 09/17 and 02/19/2011.  She is scheduled to see me again on June 05, 2011.  On 03/01/2011, the ferritin was 597.  On 03/29/2011, ferritin was 81, and today ferritin came back 11.  In addition, the patient's hemoglobin today is 7.7 with hematocrit 25.1.  On 11/01, her hemoglobin was 11.2, hematocrit 34.2.  These numbers would suggest some degree of brisk GI bleeding considering that she is not frankly iron deficient at this time.  MCV and MCH today, 84.5 and 25.9, respectively, whereas on 11/01 MCV and MCH were 97.0 and 31.8, respectively.  We are going to plan to give Mrs. Codd Feraheme 510 mg tomorrow on December 7th, which is Friday, and again on December 13th.  We will be seeing her again on January 8th at which time I have also scheduled her for more Feraheme 510 mg with additional doses put in for February 5th, March 5th and August 28, 2011.  I have spoken with Dr. [name] in the past, most recently 02/14/2011 indicating that I was concerned about the rather dramatic drops in her ferritin and suggesting that she may need additional GI workup.  I had dictated chart notes on 02/01/2011 and on 02/14/2011.  We will need to check with the patient to see whether she has had additional follow-up with Dr. [name].  In looking over my note from 02/01/2011, apparently the patient had an AVM involving the duodenum, and I believe that study was carried out somewhere in mid August.    ______________________________ Samul Dada, M.D. DSM/MEDQ  D:  05/03/2011  T:  05/04/2011  Job:  [id number]

## 2011-06-05 ENCOUNTER — Other Ambulatory Visit: Payer: Self-pay | Admitting: Oncology

## 2011-06-05 ENCOUNTER — Ambulatory Visit: Payer: Medicare Other | Admitting: Oncology

## 2011-06-05 ENCOUNTER — Telehealth: Payer: Self-pay | Admitting: Oncology

## 2011-06-05 ENCOUNTER — Ambulatory Visit (HOSPITAL_BASED_OUTPATIENT_CLINIC_OR_DEPARTMENT_OTHER): Payer: Medicare Other

## 2011-06-05 VITALS — BP 128/80 | HR 83 | Temp 98.3°F

## 2011-06-05 VITALS — Wt 188.6 lb

## 2011-06-05 DIAGNOSIS — D509 Iron deficiency anemia, unspecified: Secondary | ICD-10-CM

## 2011-06-05 LAB — COMPREHENSIVE METABOLIC PANEL
AST: 109 U/L — ABNORMAL HIGH (ref 0–37)
Albumin: 3.4 g/dL — ABNORMAL LOW (ref 3.5–5.2)
Alkaline Phosphatase: 80 U/L (ref 39–117)
BUN: 16 mg/dL (ref 6–23)
Creatinine, Ser: 0.66 mg/dL (ref 0.50–1.10)
Glucose, Bld: 122 mg/dL — ABNORMAL HIGH (ref 70–99)
Total Bilirubin: 0.3 mg/dL (ref 0.3–1.2)

## 2011-06-05 LAB — FERRITIN: Ferritin: 669 ng/mL — ABNORMAL HIGH (ref 10–291)

## 2011-06-05 LAB — IRON AND TIBC
%SAT: 49 % (ref 20–55)
Iron: 151 ug/dL — ABNORMAL HIGH (ref 42–145)
TIBC: 307 ug/dL (ref 250–470)
UIBC: 156 ug/dL (ref 125–400)

## 2011-06-05 LAB — CBC WITH DIFFERENTIAL/PLATELET
Basophils Absolute: 0 10*3/uL (ref 0.0–0.1)
EOS%: 1.2 % (ref 0.0–7.0)
Eosinophils Absolute: 0.1 10*3/uL (ref 0.0–0.5)
HGB: 10.6 g/dL — ABNORMAL LOW (ref 11.6–15.9)
LYMPH%: 27 % (ref 14.0–49.7)
MCH: 31.3 pg (ref 25.1–34.0)
MCV: 95 fL (ref 79.5–101.0)
MONO%: 9.3 % (ref 0.0–14.0)
NEUT#: 3.3 10*3/uL (ref 1.5–6.5)
NEUT%: 62.1 % (ref 38.4–76.8)
Platelets: 261 10*3/uL (ref 145–400)

## 2011-06-05 MED ORDER — SODIUM CHLORIDE 0.9 % IV SOLN
Freq: Once | INTRAVENOUS | Status: AC
  Administered 2011-06-05: 14:00:00 via INTRAVENOUS

## 2011-06-05 MED ORDER — FERUMOXYTOL INJECTION 510 MG/17 ML
510.0000 mg | Freq: Once | INTRAVENOUS | Status: AC
  Administered 2011-06-05: 510 mg via INTRAVENOUS
  Filled 2011-06-05: qty 17

## 2011-06-05 NOTE — Progress Notes (Signed)
CC:   [name], MD  HISTORY:  I saw [name]. Jenne today for followup of her recurrent iron deficiency anemia with clinical history and stool hemoccults indicating GI bleeding.  The patient has had stools that have been positive for blood on several different occasions.  Also by history, the patient continues to give a  history of foul-smelling black stools, also red blood "pouring from her rectum."  The patient said that she had foul- smelling dark stools last night.  The amount of red blood may be relatively small by history.  The patient has multiple other problems.  We have given her intravenous iron on multiple occasions.  This is well documented in our notes.  Most recently, the patient has received Feraheme 510 mg today but also on December 26th, December 19th, September 24th, September 17th, September 10th, July 27th, July 20th, September 21, 2010, September 14, 2010 and May 18, 2010.  The patient required admission to the hospital on December 7th for a hemoglobin that had dropped to 7.7, hematocrit 24.6.  At that time, her ferritin was 11 with an iron saturation of 3%.  On 11/01, her ferritin was 81, and back on 03/01/2011 ferritin was 597 with an iron saturation of 29%.  These rather dramatic changes in her iron stores also are supportive and virtually diagnostic of severe blood loss.  The patient apparently has a history of a duodenal AVM.  However, when she was hospitalized back in early December, an EGD by Dr. [name] apparently did not find any source of bleeding or any AVMs.  The patient during that admission, did receive 2 units of packed red cells.  The patient is without any new complaints today.  PROBLEM LIST: 1. Recurrent iron-deficiency anemia secondary to GI bleeding.  Heavily     dependent now on intravenous iron infusions and occasionally red     cells. 2. History of duodenal arteriovenous malformation. 3. History of hepatitis C  infection. 4. Gastroesophageal reflux disease. 5. Depression. 6. Hypertension. 7. History of right Bell's palsy. 8. 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 9. Neurodermatitis. 10.Previous history of alcohol usage.  MEDICATIONS: 1. Zyrtec 10 mg daily. 2. Neurontin 600 mg 3 times a day. 3. HydroDIURIL 12.5 mg daily. 4. Lisinopril 4 mg daily. 5. Prilosec 40 mg daily. 6. K-Dur 20 mEq daily. 7. Zoloft 25 mg daily. 8. Ultram 50 mg every 6 hours as needed. 9. Trazodone (Desyrel) 100 mg at bedtime.  PHYSICAL EXAM:  General:  The patient is somewhat lethargic in a wheelchair.  Weight today was 188.6 pounds, height 61-1/2 inches.  Vital Signs:  Blood pressure 128/80 in the left arm sitting, pulse 83 and regular, respirations regular and unlabored.  She is afebrile.  HEENT: There is no scleral icterus.  Mouth and pharynx benign.  Lymphatics:  No peripheral adenopathy palpable.  Heart/Lungs:  Normal.  In the past, I have heard a systolic ejection murmur.  Abdomen:  With the patient sitting, benign.  Extremities:  She has had an above-the-knee amputation on the left.  No edema on the right.  The patient has some scarring of her skin secondary to neurodermatitis.  LABORATORY DATA:  Today, white count 5.2, ANC 3.3, hemoglobin 10.6, hematocrit 32.0, platelets 261,000.  MCV 95.0.  MCH 31.3.  Chemistries and iron studies today are pending.  Chemistries from 05/24/2011 were notable for an albumin of 3.1.  Lipase 156, which was increased.  AST  77, which is increased, ALT 38, which is increased.  LDH from 05/03/2011 was 124.  Ferritin values are as follows:  05/08/2010 was 78; 06/09/2010 was 150; 07/13/2010 was 92; 09/11/2010 was 94; 10/11/2010 was 468; 11/09/2010 was 39; 12/07/2010 was 61; 01/08/2011 was 322; 02/01/2011 was 28; 03/01/2011 was 597; 03/29/2011 was 81; 05/03/2011 was 11.  Iron studies from today are pending.  IMPRESSION AND PLAN:   There can be little question that Mrs. Sewell is having significant blood loss to account for the fluctuations in her ferritin.  She responds well to IV Feraheme and has not had any reactions.  She did have an infusion today of Feraheme 510 mg IV.  She tells me that she will be seeing Dr. [name] tomorrow at 9:30.  Mrs. Pagnotta is scheduled to have a CBC and ferritin in 2 weeks on January 22nd and again on February 5th, March 5th and then an appointment to see me on April 2nd.  She will have CBC, chemistries, and iron studies on that date.  She is scheduled to have IV Feraheme 510 mg every 4 weeks, specifically February 5th, March 5th and again on April 2nd.  We may need to increase her IV infusions if her ferritins fail to keep pace with the current treatment program.  This was explained to Mrs. Roxan Hockey.    ______________________________ Samul Dada, M.D. DSM/MEDQ  D:  06/05/2011  T:  06/05/2011  Job:  [id number]

## 2011-06-05 NOTE — Telephone Encounter (Signed)
Labs added to iv/tx times that were already set up and appt with md was made on 4/2,printed x 2   aom

## 2011-06-05 NOTE — Patient Instructions (Signed)
1515 Pt escorted to MD office via wheelchair and verbalized understanding of next appt on schedule.

## 2011-06-05 NOTE — Progress Notes (Signed)
Addended by: Gala Romney on: 06/05/2011 03:32 PM   Modules accepted: Orders

## 2011-06-05 NOTE — Progress Notes (Signed)
This office note has been dictated.  #[id number] 

## 2011-06-06 ENCOUNTER — Encounter: Payer: Self-pay | Admitting: Medical Oncology

## 2011-06-06 DIAGNOSIS — D500 Iron deficiency anemia secondary to blood loss (chronic): Secondary | ICD-10-CM | POA: Diagnosis not present

## 2011-06-06 NOTE — Progress Notes (Signed)
Labs from 06/05/11 faxed to Dr. Jeani Hawking

## 2011-06-07 ENCOUNTER — Ambulatory Visit (HOSPITAL_COMMUNITY)
Admission: RE | Admit: 2011-06-07 | Discharge: 2011-06-07 | Disposition: A | Discharge status: Home / Self Care | Payer: Medicare Other | Source: Ambulatory Visit | Attending: Gastroenterology | Admitting: Gastroenterology

## 2011-06-07 ENCOUNTER — Encounter (HOSPITAL_COMMUNITY): Payer: Self-pay

## 2011-06-07 ENCOUNTER — Encounter (HOSPITAL_COMMUNITY)
Admission: RE | Disposition: A | Discharge status: Home / Self Care | Payer: Self-pay | Source: Ambulatory Visit | Attending: Gastroenterology

## 2011-06-07 DIAGNOSIS — Z79899 Other long term (current) drug therapy: Secondary | ICD-10-CM | POA: Diagnosis not present

## 2011-06-07 DIAGNOSIS — K922 Gastrointestinal hemorrhage, unspecified: Secondary | ICD-10-CM | POA: Diagnosis not present

## 2011-06-07 DIAGNOSIS — I10 Essential (primary) hypertension: Secondary | ICD-10-CM | POA: Insufficient documentation

## 2011-06-07 DIAGNOSIS — K219 Gastro-esophageal reflux disease without esophagitis: Secondary | ICD-10-CM | POA: Diagnosis not present

## 2011-06-07 DIAGNOSIS — D500 Iron deficiency anemia secondary to blood loss (chronic): Secondary | ICD-10-CM | POA: Diagnosis not present

## 2011-06-07 DIAGNOSIS — D509 Iron deficiency anemia, unspecified: Secondary | ICD-10-CM | POA: Diagnosis not present

## 2011-06-07 DIAGNOSIS — E785 Hyperlipidemia, unspecified: Secondary | ICD-10-CM | POA: Insufficient documentation

## 2011-06-07 HISTORY — PX: ESOPHAGOGASTRODUODENOSCOPY: SHX5428

## 2011-06-07 HISTORY — PX: HOT HEMOSTASIS: SHX5433

## 2011-06-07 SURGERY — EGD (ESOPHAGOGASTRODUODENOSCOPY)
Anesthesia: Moderate Sedation

## 2011-06-07 MED ORDER — MIDAZOLAM HCL 10 MG/2ML IJ SOLN
INTRAMUSCULAR | Status: AC
  Filled 2011-06-07: qty 4

## 2011-06-07 MED ORDER — GLUCAGON HCL (RDNA) 1 MG IJ SOLR
INTRAMUSCULAR | Status: DC | PRN
  Administered 2011-06-07: 1 mg via INTRAVENOUS

## 2011-06-07 MED ORDER — GLUCAGON HCL (RDNA) 1 MG IJ SOLR
INTRAMUSCULAR | Status: AC
  Filled 2011-06-07: qty 1

## 2011-06-07 MED ORDER — MIDAZOLAM HCL 10 MG/2ML IJ SOLN
INTRAMUSCULAR | Status: DC | PRN
  Administered 2011-06-07: 2 mg via INTRAVENOUS
  Administered 2011-06-07 (×3): 1 mg via INTRAVENOUS

## 2011-06-07 MED ORDER — SODIUM CHLORIDE 0.9 % IV SOLN
Freq: Once | INTRAVENOUS | Status: AC
  Administered 2011-06-07: 500 mL via INTRAVENOUS

## 2011-06-07 MED ORDER — BUTAMBEN-TETRACAINE-BENZOCAINE 2-2-14 % EX AERO
INHALATION_SPRAY | CUTANEOUS | Status: DC | PRN
  Administered 2011-06-07: 2 via TOPICAL

## 2011-06-07 MED ORDER — FENTANYL NICU IV SYRINGE 50 MCG/ML
INJECTION | INTRAMUSCULAR | Status: DC | PRN
  Administered 2011-06-07 (×2): 25 ug via INTRAVENOUS

## 2011-06-07 MED ORDER — FENTANYL CITRATE 0.05 MG/ML IJ SOLN
INTRAMUSCULAR | Status: AC
  Filled 2011-06-07: qty 4

## 2011-06-07 NOTE — H&P (View-Only) (Signed)
This office note has been dictated.  #[id number]

## 2011-06-07 NOTE — Interval H&P Note (Signed)
History and Physical Interval Note:  06/07/2011 3:43 PM  [name]  has presented today for surgery, with the diagnosis of bleeding AVMs/EGD w/ APC  The various methods of treatment have been discussed with the patient and family. After consideration of risks, benefits and other options for treatment, the patient has consented to  Procedure(s): ESOPHAGOGASTRODUODENOSCOPY (EGD) HOT HEMOSTASIS (ARGON PLASMA COAGULATION/BICAP) as a surgical intervention .  The patients' history has been reviewed, patient examined, no change in status, stable for surgery.  I have reviewed the patients' chart and labs.  Questions were answered to the patient's satisfaction.     [name] D

## 2011-06-07 NOTE — Op Note (Signed)
[facility] [address] [place], [place]  [address]  OPERATIVE PROCEDURE REPORT  PATIENT:  [name], [name]  MR#:  [id number] BIRTHDATE:  [birth year]  GENDER:  female ENDOSCOPIST:  Jeani Hawking, MD PROCEDURE DATE:  06/07/2011 PROCEDURE:  EGD, diagnostic [phone] ASA CLASS:  Class III INDICATIONS:  Recurrent GI bleed and history of duodenal AVMs MEDICATIONS:  Fentanyl 50 mcg IV, Versed 5 mg IV, glucagon 1 mg IV  DESCRIPTION OF PROCEDURE:   After the risks benefits and alternatives of the procedure were thoroughly explained, informed consent was obtained.  The EG-[id number] ([id number]) endoscope was introduced through the mouth and advanced to the third portion of the duodenum, without limitations.  The instrument was slowly withdrawn as the mucosa was fully examined. <<PROCEDUREIMAGES>>  FINDINGS: The upper, middle, and distal third of the esophagus were carefully inspected and no abnormalities were noted. The z-line was well seen at the GEJ. The endoscope was pushed into the fundus which was normal including a retroflexed view. The antrum,gastric body, first and second part of the duodenum were unremarkable. Careful inspection of the duodenum was again performed. There was no stigmata of bleeding. I was not able to identify any other AVMs. The duodenoscope was also employed to interrogate the duodenum, but no AVMs were found.    Retroflexion was not performed.  The scope was then withdrawn from the patient and the procedure terminated.  COMPLICATIONS:  None  IMPRESSION:  1) Normal EGD RECOMMENDATIONS:  1) I will obtain a second opinion from [facility].  ______________________________ Jeani Hawking, MD  CC:  Kimberlee Nearing, M.D.  n. eSIGNED:   Jeani Hawking at 06/07/2011 04:14 PM  Claiborne Billings, [phone]

## 2011-06-08 ENCOUNTER — Encounter (HOSPITAL_COMMUNITY): Payer: Self-pay

## 2011-06-08 ENCOUNTER — Encounter (HOSPITAL_COMMUNITY): Payer: Self-pay | Admitting: Gastroenterology

## 2011-06-11 ENCOUNTER — Emergency Department (HOSPITAL_COMMUNITY)
Admission: EM | Admit: 2011-06-11 | Discharge: 2011-06-11 | Disposition: A | Discharge status: Home / Self Care | Payer: Medicare Other | Attending: Emergency Medicine | Admitting: Emergency Medicine

## 2011-06-11 ENCOUNTER — Encounter (HOSPITAL_COMMUNITY): Payer: Self-pay | Admitting: Emergency Medicine

## 2011-06-11 DIAGNOSIS — I10 Essential (primary) hypertension: Secondary | ICD-10-CM | POA: Insufficient documentation

## 2011-06-11 DIAGNOSIS — K625 Hemorrhage of anus and rectum: Secondary | ICD-10-CM | POA: Diagnosis not present

## 2011-06-11 DIAGNOSIS — D509 Iron deficiency anemia, unspecified: Secondary | ICD-10-CM | POA: Insufficient documentation

## 2011-06-11 DIAGNOSIS — R109 Unspecified abdominal pain: Secondary | ICD-10-CM | POA: Diagnosis not present

## 2011-06-11 DIAGNOSIS — R195 Other fecal abnormalities: Secondary | ICD-10-CM | POA: Insufficient documentation

## 2011-06-11 LAB — DIFFERENTIAL
Lymphs Abs: 1.2 10*3/uL (ref 0.7–4.0)
Monocytes Relative: 13 % — ABNORMAL HIGH (ref 3–12)
Neutro Abs: 3.5 10*3/uL (ref 1.7–7.7)
Neutrophils Relative %: 63 % (ref 43–77)

## 2011-06-11 LAB — BASIC METABOLIC PANEL
BUN: 10 mg/dL (ref 6–23)
CO2: 25 mEq/L (ref 19–32)
Chloride: 105 mEq/L (ref 96–112)
GFR calc Af Amer: >90 mL/min (ref >90)
Glucose, Bld: 137 mg/dL — ABNORMAL HIGH (ref 70–99)
Potassium: 3.5 mEq/L (ref 3.5–5.1)

## 2011-06-11 LAB — CBC
HCT: 32.8 % — ABNORMAL LOW (ref 36.0–46.0)
Hemoglobin: 10.9 g/dL — ABNORMAL LOW (ref 12.0–15.0)
RBC: 3.52 MIL/uL — ABNORMAL LOW (ref 3.87–5.11)

## 2011-06-11 LAB — OCCULT BLOOD, POC DEVICE: Fecal Occult Bld: POSITIVE

## 2011-06-11 NOTE — ED Notes (Signed)
PT. REPORTS RECTAL BLEEDING THIS BLEEDING / BLACK STOOLS , DENIES ABDOMINAL PAIN , NO NAUSEA OR VOMITTING .

## 2011-06-11 NOTE — ED Provider Notes (Signed)
History     CSN: [id number]  Arrival date & time 06/11/11  1449   First MD Initiated Contact with Patient 06/11/11 1714      Chief Complaint  Patient presents with  . Rectal Bleeding    (Consider location/radiation/quality/duration/timing/severity/associated sxs/prior treatment) HPI Complains of rectal bleeding onset last night blood mixed with black stools. Had another episode today complains of crampy diffuse abdominal pain since yesterday which is mild at present no vomiting no other complaint. Spoke with Dr.HUNG who advised her to come here. No other associated symptoms no  treatment prior to coming here Past Medical History  Diagnosis Date  . Anemia, iron deficiency 05/03/2011  . Anemia   . Hypertension   . Colon polyps   . Stomach problems   . Hypokalemic alkalosis     Past Surgical History  Procedure Date  . Leg amputation above knee   . Esophagogastroduodenoscopy 05/05/2011    Procedure: ESOPHAGOGASTRODUODENOSCOPY (EGD);  Surgeon: Erick Blinks, MD;  Location: [facility];  Service: Gastroenterology;  Laterality: N/A;  Dr. Rhea Belton will do procedure for Dr. [name] Saturday.  . Esophagogastroduodenoscopy 05/08/2011    Procedure: ESOPHAGOGASTRODUODENOSCOPY (EGD);  Surgeon: Theda Belfast;  Location: [facility];  Service: Endoscopy;  Laterality: N/A;  . Esophagogastroduodenoscopy 06/07/2011    Procedure: ESOPHAGOGASTRODUODENOSCOPY (EGD);  Surgeon: Theda Belfast, MD;  Location: [facility];  Service: Endoscopy;  Laterality: N/A;  . Hot hemostasis 06/07/2011    Procedure: HOT HEMOSTASIS (ARGON PLASMA COAGULATION/BICAP);  Surgeon: Theda Belfast, MD;  Location: [facility];  Service: Endoscopy;  Laterality: N/A;    No family history on file.  History  Substance Use Topics  . Smoking status: Current Some Day Smoker -- 0.5 packs/day  . Smokeless tobacco: Never Used  . Alcohol Use: Yes     occasionally    OB History    Grav Para Term Preterm Abortions TAB SAB Ect Mult  Living                  Review of Systems  Constitutional: Negative.   HENT: Negative.   Respiratory: Negative.   Cardiovascular: Negative.   Gastrointestinal: Positive for abdominal pain and blood in stool.  Musculoskeletal: Negative.   Skin: Negative.   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   Psychiatric/Behavioral: Negative.   All other systems reviewed and are negative.    Allergies  Codeine and Penicillins  Home Medications   Current Outpatient Rx  Name Route Sig Dispense Refill  . CETIRIZINE HCL 10 MG PO TABS Oral Take 10 mg by mouth daily.      Marland Kitchen FERROUS SULFATE 325 (65 FE) MG PO TABS Oral Take 325 mg by mouth daily with breakfast.    . GABAPENTIN 300 MG PO CAPS Oral Take 600 mg by mouth 3 (three) times daily.      Marland Kitchen HYDROCHLOROTHIAZIDE 12.5 MG PO TABS Oral Take 12.5 mg by mouth daily.      Marland Kitchen LISINOPRIL 40 MG PO TABS Oral Take 40 mg by mouth daily.      Marland Kitchen OMEPRAZOLE 40 MG PO CPDR Oral Take 40 mg by mouth daily.      Marland Kitchen POTASSIUM CHLORIDE CRYS ER 20 MEQ PO TBCR Oral Take 20 mEq by mouth daily.      . SERTRALINE HCL 100 MG PO TABS Oral Take 100 mg by mouth daily.    . TRAMADOL HCL 50 MG PO TABS Oral Take  50 mg by mouth every 6 (six) hours as needed. For pain.Maximum dose= 8 tablets per day     . TRAZODONE HCL 100 MG PO TABS Oral Take 100 mg by mouth at bedtime.      . TRIAMCINOLONE ACETONIDE 0.1 % EX CREA Topical Apply 1 application topically 2 (two) times daily as needed. As needed for itching.      BP 132/92  Pulse 98  Temp(Src) 98.8 F (37.1 C) (Oral)  Resp 18  Ht 5' 3.5" (1.613 m)  Wt 188 lb (85.276 kg)  BMI 32.78 kg/m2  SpO2 97%  Physical Exam  Vitals reviewed. Constitutional: She appears well-developed and well-nourished.  HENT:  Head: Normocephalic and atraumatic.  Eyes: Conjunctivae are normal. Pupils are equal, round, and reactive to light.  Neck: Neck supple. No tracheal deviation  present. No thyromegaly present.  Cardiovascular: Normal rate and regular rhythm.   No murmur heard. Pulmonary/Chest: Effort normal and breath sounds normal.  Abdominal: Soft. Bowel sounds are normal. She exhibits no distension. There is no tenderness.  Genitourinary: Guaiac positive stool.       Rectum  nontenderBrown stool trace Hemoccult positive  Musculoskeletal: Normal range of motion. She exhibits no edema and no tenderness.       Left sided BKA  Neurological: She is alert. Coordination normal.  Skin: Skin is warm and dry. No rash noted.  Psychiatric: She has a normal mood and affect.    ED Course  Procedures (including critical care time)  Labs Reviewed  CBC - Abnormal; Notable for the following:    RBC 3.52 (*)    Hemoglobin 10.9 (*)    HCT 32.8 (*)    RDW 19.2 (*)    All other components within normal limits  DIFFERENTIAL - Abnormal; Notable for the following:    Monocytes Relative 13 (*)    All other components within normal limits  BASIC METABOLIC PANEL - Abnormal; Notable for the following:    Glucose, Bld 137 (*)    All other components within normal limits  SAMPLE TO BLOOD BANK   No results found.   No diagnosis found. Results for orders placed during the hospital encounter of 06/11/11  CBC      Component Value Range   WBC 5.6  4.0 - 10.5 (K/uL)   RBC 3.52 (*) 3.87 - 5.11 (MIL/uL)   Hemoglobin 10.9 (*) 12.0 - 15.0 (g/dL)   HCT 16.1 (*) 09.6 - 46.0 (%)   MCV 93.2  78.0 - 100.0 (fL)   MCH 31.0  26.0 - 34.0 (pg)   MCHC 33.2  30.0 - 36.0 (g/dL)   RDW 04.5 (*) 40.9 - 15.5 (%)   Platelets 222  150 - 400 (K/uL)  DIFFERENTIAL      Component Value Range   Neutrophils Relative 63  43 - 77 (%)   Neutro Abs 3.5  1.7 - 7.7 (K/uL)   Lymphocytes Relative 22  12 - 46 (%)   Lymphs Abs 1.2  0.7 - 4.0 (K/uL)   Monocytes Relative 13 (*) 3 - 12 (%)   Monocytes Absolute 0.7  0.1 - 1.0 (K/uL)   Eosinophils Relative 1  0 - 5 (%)   Eosinophils Absolute 0.1  0.0 - 0.7  (K/uL)   Basophils Relative 0  0 - 1 (%)   Basophils Absolute 0.0  0.0 - 0.1 (K/uL)  BASIC METABOLIC PANEL      Component Value Range   Sodium 141  135 - 145 (mEq/L)  Potassium 3.5  3.5 - 5.1 (mEq/L)   Chloride 105  96 - 112 (mEq/L)   CO2 25  19 - 32 (mEq/L)   Glucose, Bld 137 (*) 70 - 99 (mg/dL)   BUN 10  6 - 23 (mg/dL)   Creatinine, Ser 9.14  0.50 - 1.10 (mg/dL)   Calcium 8.8  8.4 - 78.2 (mg/dL)   GFR calc non Af Amer >90  >90 (mL/min)   GFR calc Af Amer >90  >90 (mL/min)  SAMPLE TO BLOOD BANK      Component Value Range   Blood Bank Specimen SAMPLE AVAILABLE FOR TESTING     Sample Expiration 06/12/2011    OCCULT BLOOD, POC DEVICE      Component Value Range   Fecal Occult Bld POSITIVE     No results found.   MDM  Hemoglobin is improved over 06/05/2011. Patient hemodynamically stable Case was discussed with Dr.Mann. Plan patient to call office tomorrow morning Nothing to eat or drink after mid She will likely get Pill cam tomorrow to investigate rectal bleeding Diagnosis #1 rectal bleeding #2 chronic anemia        Doug Sou, MD 06/11/11 1815

## 2011-06-19 ENCOUNTER — Telehealth: Payer: Self-pay | Admitting: Oncology

## 2011-06-19 ENCOUNTER — Other Ambulatory Visit: Payer: Medicare Other | Admitting: Lab

## 2011-06-19 NOTE — Telephone Encounter (Signed)
pt had called to r/s 1/22 appt and to r/s to 1/23 due to transportation  aom

## 2011-06-20 ENCOUNTER — Other Ambulatory Visit (HOSPITAL_BASED_OUTPATIENT_CLINIC_OR_DEPARTMENT_OTHER): Payer: Medicare Other | Admitting: Lab

## 2011-06-20 ENCOUNTER — Telehealth: Payer: Self-pay | Admitting: Medical Oncology

## 2011-06-20 DIAGNOSIS — D509 Iron deficiency anemia, unspecified: Secondary | ICD-10-CM | POA: Diagnosis not present

## 2011-06-20 LAB — CBC WITH DIFFERENTIAL/PLATELET
Basophils Absolute: 0 10*3/uL (ref 0.0–0.1)
EOS%: 2 % (ref 0.0–7.0)
HGB: 11.7 g/dL (ref 11.6–15.9)
MCH: 32.3 pg (ref 25.1–34.0)
NEUT#: 3.3 10*3/uL (ref 1.5–6.5)
RDW: 18.3 % — ABNORMAL HIGH (ref 11.2–14.5)
WBC: 5.1 10*3/uL (ref 3.9–10.3)
lymph#: 1.1 10*3/uL (ref 0.9–3.3)

## 2011-06-20 LAB — FERRITIN: Ferritin: 531 ng/mL — ABNORMAL HIGH (ref 10–291)

## 2011-06-20 NOTE — Telephone Encounter (Signed)
Pt was here to have labs drawn today and she asked the schedulers to have me give her a call. I called but her family member states she is asleep. I asked him to tell her that I called and we will follow up tomorrow.

## 2011-06-21 ENCOUNTER — Other Ambulatory Visit: Payer: Self-pay

## 2011-06-21 ENCOUNTER — Other Ambulatory Visit: Payer: Self-pay | Admitting: Oncology

## 2011-06-21 ENCOUNTER — Telehealth: Payer: Self-pay

## 2011-06-21 NOTE — Telephone Encounter (Signed)
S/w pt, she does not have transportation and is working on paperwork for SCAT, she said it will take a few weeks.  I asked pt to call the day before her next appt 2/5 if the SCAT is not arranged yet.

## 2011-07-03 ENCOUNTER — Ambulatory Visit (HOSPITAL_BASED_OUTPATIENT_CLINIC_OR_DEPARTMENT_OTHER): Payer: Medicare Other

## 2011-07-03 ENCOUNTER — Other Ambulatory Visit: Payer: Self-pay | Admitting: Oncology

## 2011-07-03 ENCOUNTER — Other Ambulatory Visit (HOSPITAL_BASED_OUTPATIENT_CLINIC_OR_DEPARTMENT_OTHER): Payer: Medicare Other | Admitting: Lab

## 2011-07-03 ENCOUNTER — Telehealth: Payer: Self-pay | Admitting: Medical Oncology

## 2011-07-03 VITALS — BP 136/89 | HR 91 | Temp 98.8°F

## 2011-07-03 DIAGNOSIS — D509 Iron deficiency anemia, unspecified: Secondary | ICD-10-CM | POA: Diagnosis not present

## 2011-07-03 LAB — CBC WITH DIFFERENTIAL/PLATELET
Eosinophils Absolute: 0.1 10*3/uL (ref 0.0–0.5)
LYMPH%: 27 % (ref 14.0–49.7)
MONO#: 0.4 10*3/uL (ref 0.1–0.9)
NEUT#: 3 10*3/uL (ref 1.5–6.5)
Platelets: 286 10*3/uL (ref 145–400)
RBC: 3.52 10*6/uL — ABNORMAL LOW (ref 3.70–5.45)
RDW: 14 % (ref 11.2–14.5)
WBC: 4.8 10*3/uL (ref 3.9–10.3)

## 2011-07-03 MED ORDER — FERUMOXYTOL INJECTION 510 MG/17 ML
510.0000 mg | Freq: Once | INTRAVENOUS | Status: AC
  Administered 2011-07-03: 510 mg via INTRAVENOUS
  Filled 2011-07-03: qty 17

## 2011-07-03 MED ORDER — SODIUM CHLORIDE 0.9 % IV SOLN
Freq: Once | INTRAVENOUS | Status: AC
  Administered 2011-07-03: 13:00:00 via INTRAVENOUS

## 2011-07-03 NOTE — Telephone Encounter (Signed)
Pt has asked the infusion nurse should she restart her oral iron. I called her to let her know that she does not need to take the oral iron. Dr. Arline Asp is doing labs and monitoring her ferritin. When it is low he sets her up for IV iron. She voiced understanding.

## 2011-07-03 NOTE — Patient Instructions (Signed)
[facility] Discharge Instructions for Patients Receiving Iron Today you received the following chemotherapy agents Feraheme   Call the office for any treatment related side effects. Call your doctor if you have any bleeding.    BELOW ARE SYMPTOMS THAT SHOULD BE REPORTED IMMEDIATELY:  *FEVER GREATER THAN 100.5 F  *CHILLS WITH OR WITHOUT FEVER  *UNUSUAL SHORTNESS OF BREATH  *UNUSUAL BRUISING OR BLEEDING  TENDERNESS IN MOUTH AND THROAT WITH OR WITHOUT PRESENCE OF ULCERS  *URINARY PROBLEMS  *BOWEL PROBLEMS  UNUSUAL RASH Items with * indicate a potential emergency and should be followed up as soon as possible.  Feel free to call the clinic you have any questions or concerns. The clinic phone number is [phone].   I have been informed and understand all the instructions given to me. I know to contact the clinic, my physician, or go to the Emergency Department if any problems should occur. I do not have any questions at this time, but understand that I may call the clinic during office hours   should I have any questions or need assistance in obtaining follow up care.    __________________________________________  _____________  __________ Signature of Patient or Authorized Representative            Date                   Time    __________________________________________ Nurse's Signature

## 2011-07-19 DIAGNOSIS — K219 Gastro-esophageal reflux disease without esophagitis: Secondary | ICD-10-CM | POA: Diagnosis not present

## 2011-07-19 DIAGNOSIS — M795 Residual foreign body in soft tissue: Secondary | ICD-10-CM | POA: Diagnosis not present

## 2011-07-19 DIAGNOSIS — IMO0001 Reserved for inherently not codable concepts without codable children: Secondary | ICD-10-CM | POA: Diagnosis not present

## 2011-07-19 DIAGNOSIS — I10 Essential (primary) hypertension: Secondary | ICD-10-CM | POA: Diagnosis not present

## 2011-07-26 ENCOUNTER — Telehealth: Payer: Self-pay | Admitting: Oncology

## 2011-07-26 NOTE — Telephone Encounter (Signed)
pt had called to r/s 3/5 to earlier which was done and pt is aware.  Also advised her of her 4/2 appt and that she would need to make p/u arrangemnts since tht appt last longer aom

## 2011-07-31 ENCOUNTER — Ambulatory Visit: Payer: Medicare Other

## 2011-07-31 ENCOUNTER — Other Ambulatory Visit: Payer: Medicare Other | Admitting: Lab

## 2011-07-31 ENCOUNTER — Other Ambulatory Visit (HOSPITAL_BASED_OUTPATIENT_CLINIC_OR_DEPARTMENT_OTHER): Payer: Medicare Other | Admitting: Lab

## 2011-07-31 ENCOUNTER — Ambulatory Visit (HOSPITAL_BASED_OUTPATIENT_CLINIC_OR_DEPARTMENT_OTHER): Payer: Medicare Other

## 2011-07-31 ENCOUNTER — Encounter: Payer: Self-pay | Admitting: Oncology

## 2011-07-31 ENCOUNTER — Other Ambulatory Visit: Payer: Self-pay | Admitting: Oncology

## 2011-07-31 VITALS — BP 125/77 | HR 72 | Temp 98.3°F

## 2011-07-31 DIAGNOSIS — D509 Iron deficiency anemia, unspecified: Secondary | ICD-10-CM

## 2011-07-31 LAB — CBC WITH DIFFERENTIAL/PLATELET
Basophils Absolute: 0 10*3/uL (ref 0.0–0.1)
Eosinophils Absolute: 0.1 10*3/uL (ref 0.0–0.5)
HCT: 31.6 % — ABNORMAL LOW (ref 34.8–46.6)
HGB: 10.4 g/dL — ABNORMAL LOW (ref 11.6–15.9)
MONO#: 0.7 10*3/uL (ref 0.1–0.9)
NEUT%: 53.5 % (ref 38.4–76.8)
WBC: 5.2 10*3/uL (ref 3.9–10.3)
lymph#: 1.6 10*3/uL (ref 0.9–3.3)

## 2011-07-31 LAB — FERRITIN: Ferritin: 112 ng/mL (ref 10–291)

## 2011-07-31 MED ORDER — FERUMOXYTOL INJECTION 510 MG/17 ML
510.0000 mg | Freq: Once | INTRAVENOUS | Status: AC
  Administered 2011-07-31: 510 mg via INTRAVENOUS
  Filled 2011-07-31: qty 17

## 2011-07-31 NOTE — Progress Notes (Signed)
Patient has received IV Feraheme 510 mg on 1/8, 2/5 and 3/5.  Nevertheless ferritin has dropped from 531 on 06/20/11 to 112 on 3/5.  I have ordered another dose of IV Feraheme for 3/19 with labs.  Patient is to see me with labs and IV Feraheme on 08/28/11.

## 2011-08-01 ENCOUNTER — Telehealth: Payer: Self-pay | Admitting: Oncology

## 2011-08-01 NOTE — Telephone Encounter (Signed)
called the pt with the 3/19 appt and she questioned why,advised her i will have  the nurse to call her.   aom

## 2011-08-02 ENCOUNTER — Ambulatory Visit: Payer: Medicare Other

## 2011-08-02 ENCOUNTER — Other Ambulatory Visit: Payer: Medicare Other | Admitting: Lab

## 2011-08-02 ENCOUNTER — Telehealth: Payer: Self-pay | Admitting: Medical Oncology

## 2011-08-02 NOTE — Telephone Encounter (Signed)
I called pt per Dr. Arline Asp to let her know that her ferritin has dropped from 350 to 112 and her HGB 11.5 to 10.4. Dr. Arline Asp feels she is still bleeding and she needs more ferritin. She states that she is going to see a physician in [facility] tomorrow and she is going to all me tomorrow afternoon and get her infusion dates.

## 2011-08-03 DIAGNOSIS — K31819 Angiodysplasia of stomach and duodenum without bleeding: Secondary | ICD-10-CM | POA: Insufficient documentation

## 2011-08-03 DIAGNOSIS — K921 Melena: Secondary | ICD-10-CM | POA: Diagnosis not present

## 2011-08-03 DIAGNOSIS — E7800 Pure hypercholesterolemia, unspecified: Secondary | ICD-10-CM | POA: Insufficient documentation

## 2011-08-03 DIAGNOSIS — E785 Hyperlipidemia, unspecified: Secondary | ICD-10-CM | POA: Insufficient documentation

## 2011-08-03 DIAGNOSIS — D509 Iron deficiency anemia, unspecified: Secondary | ICD-10-CM | POA: Diagnosis not present

## 2011-08-08 ENCOUNTER — Other Ambulatory Visit: Payer: Self-pay | Admitting: Oncology

## 2011-08-09 ENCOUNTER — Telehealth: Payer: Self-pay | Admitting: *Deleted

## 2011-08-13 DIAGNOSIS — K319 Disease of stomach and duodenum, unspecified: Secondary | ICD-10-CM | POA: Diagnosis not present

## 2011-08-13 DIAGNOSIS — Z86010 Personal history of colonic polyps: Secondary | ICD-10-CM | POA: Diagnosis not present

## 2011-08-13 DIAGNOSIS — K219 Gastro-esophageal reflux disease without esophagitis: Secondary | ICD-10-CM | POA: Diagnosis not present

## 2011-08-13 DIAGNOSIS — D500 Iron deficiency anemia secondary to blood loss (chronic): Secondary | ICD-10-CM | POA: Diagnosis not present

## 2011-08-13 DIAGNOSIS — I10 Essential (primary) hypertension: Secondary | ICD-10-CM | POA: Diagnosis not present

## 2011-08-13 DIAGNOSIS — K766 Portal hypertension: Secondary | ICD-10-CM | POA: Diagnosis not present

## 2011-08-13 DIAGNOSIS — S78119A Complete traumatic amputation at level between unspecified hip and knee, initial encounter: Secondary | ICD-10-CM | POA: Diagnosis not present

## 2011-08-13 DIAGNOSIS — K31819 Angiodysplasia of stomach and duodenum without bleeding: Secondary | ICD-10-CM | POA: Diagnosis not present

## 2011-08-13 DIAGNOSIS — Z8619 Personal history of other infectious and parasitic diseases: Secondary | ICD-10-CM | POA: Diagnosis not present

## 2011-08-13 DIAGNOSIS — K7460 Unspecified cirrhosis of liver: Secondary | ICD-10-CM | POA: Diagnosis not present

## 2011-08-13 DIAGNOSIS — I8510 Secondary esophageal varices without bleeding: Secondary | ICD-10-CM | POA: Diagnosis not present

## 2011-08-13 DIAGNOSIS — K5521 Angiodysplasia of colon with hemorrhage: Secondary | ICD-10-CM | POA: Diagnosis not present

## 2011-08-13 DIAGNOSIS — I440 Atrioventricular block, first degree: Secondary | ICD-10-CM | POA: Diagnosis not present

## 2011-08-13 DIAGNOSIS — B182 Chronic viral hepatitis C: Secondary | ICD-10-CM | POA: Diagnosis not present

## 2011-08-13 DIAGNOSIS — E785 Hyperlipidemia, unspecified: Secondary | ICD-10-CM | POA: Diagnosis not present

## 2011-08-13 DIAGNOSIS — Z90710 Acquired absence of both cervix and uterus: Secondary | ICD-10-CM | POA: Diagnosis not present

## 2011-08-14 ENCOUNTER — Ambulatory Visit: Payer: Medicare Other

## 2011-08-14 ENCOUNTER — Other Ambulatory Visit: Payer: Medicare Other

## 2011-08-17 ENCOUNTER — Telehealth: Payer: Self-pay

## 2011-08-17 ENCOUNTER — Other Ambulatory Visit (HOSPITAL_BASED_OUTPATIENT_CLINIC_OR_DEPARTMENT_OTHER): Payer: Medicare Other | Admitting: Lab

## 2011-08-17 ENCOUNTER — Ambulatory Visit (HOSPITAL_BASED_OUTPATIENT_CLINIC_OR_DEPARTMENT_OTHER): Payer: Medicare Other

## 2011-08-17 VITALS — BP 144/84 | HR 69 | Temp 99.1°F

## 2011-08-17 DIAGNOSIS — D509 Iron deficiency anemia, unspecified: Secondary | ICD-10-CM

## 2011-08-17 LAB — CBC WITH DIFFERENTIAL/PLATELET
Basophils Absolute: 0 10*3/uL (ref 0.0–0.1)
EOS%: 1.6 % (ref 0.0–7.0)
HCT: 28.9 % — ABNORMAL LOW (ref 34.8–46.6)
HGB: 9.5 g/dL — ABNORMAL LOW (ref 11.6–15.9)
MCH: 32.8 pg (ref 25.1–34.0)
MCV: 99.9 fL (ref 79.5–101.0)
MONO%: 13 % (ref 0.0–14.0)
NEUT%: 66.1 % (ref 38.4–76.8)

## 2011-08-17 MED ORDER — FERUMOXYTOL INJECTION 510 MG/17 ML
510.0000 mg | Freq: Once | INTRAVENOUS | Status: AC
  Administered 2011-08-17: 510 mg via INTRAVENOUS
  Filled 2011-08-17: qty 17

## 2011-08-17 MED ORDER — SODIUM CHLORIDE 0.9 % IV SOLN
INTRAVENOUS | Status: DC
  Administered 2011-08-17: 10:00:00 via INTRAVENOUS

## 2011-08-17 NOTE — Patient Instructions (Signed)
[facility] Discharge Instructions   Today you received the following: Feraheme  BELOW ARE SYMPTOMS THAT SHOULD BE REPORTED IMMEDIATELY:  *FEVER GREATER THAN 100.5 F  *CHILLS WITH OR WITHOUT FEVER  NAUSEA AND VOMITING THAT IS NOT CONTROLLED WITH YOUR NAUSEA MEDICATION  *UNUSUAL SHORTNESS OF BREATH  *UNUSUAL BRUISING OR BLEEDING  TENDERNESS IN MOUTH AND THROAT WITH OR WITHOUT PRESENCE OF ULCERS  *URINARY PROBLEMS  *BOWEL PROBLEMS  UNUSUAL RASH Items with * indicate a potential emergency and should be followed up as soon as possible.  Feel free to call the clinic you have any questions or concerns. The clinic phone number is [phone].

## 2011-08-17 NOTE — Telephone Encounter (Signed)
Pt spoke with Tiffany RN earlier today. Tiffany stated that pt does NOT want [place] giving any information to [employer], or a person named Victorino Dike from [employer].

## 2011-08-28 ENCOUNTER — Other Ambulatory Visit (HOSPITAL_BASED_OUTPATIENT_CLINIC_OR_DEPARTMENT_OTHER): Payer: Medicare Other | Admitting: Lab

## 2011-08-28 ENCOUNTER — Encounter: Payer: Self-pay | Admitting: Oncology

## 2011-08-28 ENCOUNTER — Ambulatory Visit (HOSPITAL_BASED_OUTPATIENT_CLINIC_OR_DEPARTMENT_OTHER): Payer: Medicaid Other

## 2011-08-28 ENCOUNTER — Telehealth: Payer: Self-pay | Admitting: Oncology

## 2011-08-28 ENCOUNTER — Ambulatory Visit: Payer: Medicare Other | Admitting: Oncology

## 2011-08-28 VITALS — BP 116/73 | HR 67 | Temp 97.7°F | Ht 63.5 in | Wt 185.3 lb

## 2011-08-28 DIAGNOSIS — D509 Iron deficiency anemia, unspecified: Secondary | ICD-10-CM | POA: Diagnosis not present

## 2011-08-28 DIAGNOSIS — D649 Anemia, unspecified: Secondary | ICD-10-CM

## 2011-08-28 DIAGNOSIS — B1920 Unspecified viral hepatitis C without hepatic coma: Secondary | ICD-10-CM | POA: Diagnosis not present

## 2011-08-28 DIAGNOSIS — F17200 Nicotine dependence, unspecified, uncomplicated: Secondary | ICD-10-CM

## 2011-08-28 DIAGNOSIS — B182 Chronic viral hepatitis C: Secondary | ICD-10-CM

## 2011-08-28 DIAGNOSIS — Z8619 Personal history of other infectious and parasitic diseases: Secondary | ICD-10-CM | POA: Insufficient documentation

## 2011-08-28 LAB — CBC WITH DIFFERENTIAL/PLATELET
Eosinophils Absolute: 0.1 10*3/uL (ref 0.0–0.5)
HCT: 32.2 % — ABNORMAL LOW (ref 34.8–46.6)
LYMPH%: 26.1 % (ref 14.0–49.7)
MCV: 97 fL (ref 79.5–101.0)
MONO%: 14.9 % — ABNORMAL HIGH (ref 0.0–14.0)
NEUT#: 2.7 10*3/uL (ref 1.5–6.5)
NEUT%: 56.1 % (ref 38.4–76.8)
Platelets: 237 10*3/uL (ref 145–400)
RBC: 3.32 10*6/uL — ABNORMAL LOW (ref 3.70–5.45)
nRBC: 0 % (ref 0–0)

## 2011-08-28 LAB — COMPREHENSIVE METABOLIC PANEL
ALT: 54 U/L — ABNORMAL HIGH (ref 0–35)
BUN: 15 mg/dL (ref 6–23)
CO2: 23 mEq/L (ref 19–32)
Calcium: 8.9 mg/dL (ref 8.4–10.5)
Creatinine, Ser: 0.8 mg/dL (ref 0.50–1.10)
Glucose, Bld: 88 mg/dL (ref 70–99)
Total Bilirubin: 0.3 mg/dL (ref 0.3–1.2)

## 2011-08-28 LAB — IRON AND TIBC
TIBC: 367 ug/dL (ref 250–470)
UIBC: 266 ug/dL (ref 125–400)

## 2011-08-28 MED ORDER — FERUMOXYTOL INJECTION 510 MG/17 ML
510.0000 mg | Freq: Once | INTRAVENOUS | Status: AC
  Administered 2011-08-28: 510 mg via INTRAVENOUS
  Filled 2011-08-28: qty 17

## 2011-08-28 MED ORDER — SODIUM CHLORIDE 0.9 % IJ SOLN
10.0000 mL | INTRAMUSCULAR | Status: DC | PRN
  Filled 2011-08-28: qty 10

## 2011-08-28 MED ORDER — SODIUM CHLORIDE 0.9 % IV SOLN
INTRAVENOUS | Status: DC
  Administered 2011-08-28: 12:00:00 via INTRAVENOUS

## 2011-08-28 NOTE — Progress Notes (Signed)
This office note has been dictated.  #[id number]

## 2011-08-28 NOTE — Telephone Encounter (Signed)
Gv pt appt for april-july2013 

## 2011-08-28 NOTE — Patient Instructions (Signed)
Call for any concerns [phone]

## 2011-08-29 NOTE — Progress Notes (Signed)
CC:   [name]. [name], MD [name], Dr. Clelia Croft  PROBLEM LIST:  1. Recurrent iron-deficiency anemia secondary to gastrointestinal bleeding.  The patient has been heavily dependent upon IV Feraheme.  Over the past year or so she has been receiving about 1 Feraheme infusion monthly on average.  Her most recent infusions of Feraheme 510 mg occurred on  07/31/2011, 07/03/2011 and 06/05/2011.  [name] also has required red cell transfusions in the past, most recently early December 2012.  She underwent a small bowel enteroscopy by Dr. Tilford Pillar at [facility] on 08/13/2011.  The enteroscope was passed up to 6 feet into the jejunum.  He saw multiple AVMs, which were ablated.  Two of the large AVMs bled during APC.  These were in the 4th part of the duodenum very close to the ligament of Treitz.  The remainder of the AVMs were in the proximal jejunum.  In addition to the small bowel AV malformations, which were treated with APC, he saw some small esophageal varices which were not bleeding and also evidence of mild portal hypertensive gastropathy. 2. History of duodenal arteriovenous malformation.  3. History of hepatitis C infection.  4. Gastroesophageal reflux disease.  5. Depression.  6. Hypertension.  7. History of right Bell's palsy.  8. 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  9. Neurodermatitis.  10.Previous history of alcohol usage.   MEDICATIONS: 1. Atenolol 50 mg daily. 2. Zyrtec 10 mg daily. 3. Nexium 40 mg daily. 4. Neurontin 600 mg 3 times daily. 5. Lisinopril 40 mg daily. 6. Potassium chloride 20 mEq daily. 7. Ultram 50 mg every 6 hours as needed. 8. Desyrel 100 mg at bedtime. 9. Triamcinolone cream 0.1% apply twice daily as needed.  HISTORY:  [name]. [name] was seen today for followup of her recurrent iron-deficiency anemia felt to be due to bleeding AV  malformations.  [name] was last seen by [place] on 06/05/2011.  We had been giving her iron infusions with IV on average about once a month to keep up with her ongoing iron depletion secondary to GI bleeding.  The patient had been having dark stools.  It will be recalled that she had been hospitalized back in early December 2012 for this problem.  The patient states that her stools are now brown.  There is no obvious blood.  In general she feels well and is without complaints.  She is here today for a scheduled Feraheme infusion.  PHYSICAL EXAMINATION:  She looks well and is in good spirits.  She is in a wheelchair.  She does have a walker.  It will be recalled that she had an above-the-knee amputation back in October 2010.  Weight is 185.4 pounds, height 5 feet 3-1/2 inches, body surface area 1.94 sq. m.  Blood pressure 116/73.  Other vital signs are normal.  There is no scleral icterus.  Mouth and pharynx are benign.  There is no peripheral adenopathy palpable.  There was some inspiratory wheezing.  The patient does smoke.  I have urged her to stop smoking.  Cardiac:  Regular rhythm.  I did not hear a systolic ejection murmur today as I have in the past.  Abdomen with the patient sitting is benign.  Extremities: Some puffiness of the right ankle.  She has had an above-the-knee amputation on the left.  The patient has been noted to have some scarring of her skin secondary to neurodermatitis.  Neurologic:  Without focal findings.  LABORATORY DATA:  Today, white count 4.8, ANC 2.7, hemoglobin 10.5, hematocrit 32.2, platelets 237,000.  Chemistries and iron studies were to be done today.  In looking over her past labs, her hemoglobin and hematocrit have been fairly stable over the past few months.  Red cell indices have been normal.  Chemistries from 06/05/2011 notable for BUN of 16, creatinine 0.66, calcium of 7.9, albumin 3.4.  Liver function tests were notable for an AST of 109, ALT  69, LDH was 134.  Review of, iron studies:  On 06/15/2011, ferritin was 669; on 06/20/2011, 531; on 07/03/2011, 350; on 07/31/2011, 112; and on 08/17/2011, 174.  In looking over the patient's labs, I do not see where she has ever had an alpha fetoprotein carried out in the system.  IMPRESSION AND PLAN:  [name] will receive Feraheme 510 mg IV today.  This is the last scheduled dose for her.  She is not on oral iron.  We will follow a CBC and ferritin every 4 weeks and plan to see [name] again in approximately 3 months, which will be around June 25th, at which time we will check CBC, chemistries, complete iron studies, and an alpha fetoprotein.  The patient has been instructed to stop smoking.  She has been instructed to call us if her stools turn dark or black.  Hopefully, she will not have any problems with ongoing GI bleeding.  We will know that by following her ferritin.  Dr. Achilles Dunk from [facility] suggested that the patient be placed on a nonspecific beta blocker such as propranolol for portal hypertension, also to consider ultrasound of the liver to look for cirrhosis.  In looking over her various imaging studies, I do not see a record of an ultrasound or a CT scan.  She did have digital screening mammograms on 01/19/2011, a diagnostic left limited mammogram on 02/20/2011, and a 2-view chest x-ray on 05/04/2011.  I have urged [name] to make a followup appointment with Dr. [name]. As noted, the patient has had a chronic hepatitis C infection apparently at least since 1997.  I am not sure whether the hepatitis C infection requires further evaluation, possible treatment and a screening program for hepatocellular carcinoma.  I will defer these evaluations and possible therapy to Dr. [name].  As stated, we will check labs every 4 weeks and plan to see [name] again in 3 months.    ______________________________ Samul Dada, M.D. DSM/MEDQ  D:  08/28/2011  T:  08/28/2011  Job:  [id number]

## 2011-09-05 DIAGNOSIS — D500 Iron deficiency anemia secondary to blood loss (chronic): Secondary | ICD-10-CM | POA: Diagnosis not present

## 2011-09-05 DIAGNOSIS — K5520 Angiodysplasia of colon without hemorrhage: Secondary | ICD-10-CM | POA: Diagnosis not present

## 2011-09-07 DIAGNOSIS — D649 Anemia, unspecified: Secondary | ICD-10-CM | POA: Diagnosis not present

## 2011-09-10 DIAGNOSIS — S78119A Complete traumatic amputation at level between unspecified hip and knee, initial encounter: Secondary | ICD-10-CM | POA: Diagnosis not present

## 2011-09-25 ENCOUNTER — Other Ambulatory Visit: Payer: Self-pay | Admitting: Oncology

## 2011-09-25 ENCOUNTER — Other Ambulatory Visit (HOSPITAL_BASED_OUTPATIENT_CLINIC_OR_DEPARTMENT_OTHER): Payer: Medicare Other | Admitting: Lab

## 2011-09-25 ENCOUNTER — Encounter: Payer: Self-pay | Admitting: Oncology

## 2011-09-25 DIAGNOSIS — D509 Iron deficiency anemia, unspecified: Secondary | ICD-10-CM | POA: Diagnosis not present

## 2011-09-25 LAB — CBC WITH DIFFERENTIAL/PLATELET
BASO%: 0.4 % (ref 0.0–2.0)
EOS%: 2.8 % (ref 0.0–7.0)
HCT: 34.2 % — ABNORMAL LOW (ref 34.8–46.6)
LYMPH%: 19.5 % (ref 14.0–49.7)
MCH: 32.6 pg (ref 25.1–34.0)
MCHC: 32.6 g/dL (ref 31.5–36.0)
NEUT%: 65.7 % (ref 38.4–76.8)
lymph#: 0.9 10*3/uL (ref 0.9–3.3)

## 2011-09-25 NOTE — Progress Notes (Signed)
This patient received IV Feraheme 510 mg on April 2 when her ferritin level was 304. Today which is April 30, ferritin is 123. The patient has been scheduled to receive IV Feraheme 1020 mg on or about Friday, May 3.

## 2011-09-26 ENCOUNTER — Telehealth: Payer: Self-pay | Admitting: Nurse Practitioner

## 2011-09-26 NOTE — Telephone Encounter (Signed)
Informed patient per Dr. Arline Asp- needs IV faraheme due to ferritin level.  Pt verbalized understanding.

## 2011-09-27 ENCOUNTER — Telehealth: Payer: Self-pay | Admitting: *Deleted

## 2011-09-27 ENCOUNTER — Telehealth: Payer: Self-pay | Admitting: Oncology

## 2011-09-27 NOTE — Telephone Encounter (Signed)
pt aware of her 5/7 appt  aom

## 2011-09-27 NOTE — Telephone Encounter (Signed)
Per staff message from Dr. Arline Asp desk RN, I have called the patient. Patient's husband to have patient call the office. JMW

## 2011-09-27 NOTE — Telephone Encounter (Signed)
Waiting for pt to call back.

## 2011-10-01 ENCOUNTER — Telehealth: Payer: Self-pay | Admitting: *Deleted

## 2011-10-01 NOTE — Telephone Encounter (Signed)
Patient 's caregiver called regarding treatment appt for tomorrow. I have called the patient back and spoke with her. Per patient, she can't make appt tomorrow until after 11:30. I have checked the scheduled and have no openings until after 1pm. Patient asked if she would be finished by 2pm. I told the patient I couldn't guarantee that.  I told the patient I have no openings, She then requested Wednesday morning, before I could look for opening, she decided to keep 10am appt for tomorrow.

## 2011-10-02 ENCOUNTER — Ambulatory Visit (HOSPITAL_BASED_OUTPATIENT_CLINIC_OR_DEPARTMENT_OTHER): Payer: Medicare Other

## 2011-10-02 VITALS — BP 149/87 | HR 63 | Temp 97.7°F

## 2011-10-02 DIAGNOSIS — D509 Iron deficiency anemia, unspecified: Secondary | ICD-10-CM

## 2011-10-02 MED ORDER — SODIUM CHLORIDE 0.9 % IV SOLN
1020.0000 mg | Freq: Once | INTRAVENOUS | Status: AC
  Administered 2011-10-02: 1020 mg via INTRAVENOUS
  Filled 2011-10-02: qty 34

## 2011-10-02 NOTE — Patient Instructions (Signed)
[facility] Discharge Instructions for Patients Receiving Iron Replacement  Today you received the following Iron Replacement   BELOW ARE SYMPTOMS THAT SHOULD BE REPORTED IMMEDIATELY:  *FEVER GREATER THAN 100.5 F  *CHILLS WITH OR WITHOUT FEVER  NAUSEA AND VOMITING THAT IS NOT CONTROLLED WITH YOUR NAUSEA MEDICATION  *UNUSUAL SHORTNESS OF BREATH  *UNUSUAL BRUISING OR BLEEDING  TENDERNESS IN MOUTH AND THROAT WITH OR WITHOUT PRESENCE OF ULCERS  *URINARY PROBLEMS  *BOWEL PROBLEMS  UNUSUAL RASH Items with * indicate a potential emergency and should be followed up as soon as possible.  One of the nurses will contact you 24 hours after your treatment. Please let the nurse know about any problems that you may have experienced. Feel free to call the clinic you have any questions or concerns. The clinic phone number is [phone].   I have been informed and understand all the instructions given to me. I know to contact the clinic, my physician, or go to the Emergency Department if any problems should occur. I do not have any questions at this time, but understand that I may call the clinic during office hours   should I have any questions or need assistance in obtaining follow up care.    __________________________________________  _____________  __________ Signature of Patient or Authorized Representative            Date                   Time    __________________________________________ Nurse's Signature

## 2011-10-04 ENCOUNTER — Ambulatory Visit: Payer: Medicare Other

## 2011-10-19 ENCOUNTER — Telehealth: Payer: Self-pay | Admitting: *Deleted

## 2011-10-19 NOTE — Telephone Encounter (Signed)
Per voicemail from the patient. I have moved her lab appt from Tuesday 5/28 to Thrusdauy 5/30.  JMW

## 2011-10-23 ENCOUNTER — Other Ambulatory Visit: Payer: Medicare Other

## 2011-10-23 ENCOUNTER — Telehealth: Payer: Self-pay | Admitting: Oncology

## 2011-10-23 NOTE — Telephone Encounter (Signed)
recd a call and pt states that we have to wrk around her sch not ours and moved her lab to 5/31

## 2011-10-25 ENCOUNTER — Other Ambulatory Visit: Payer: Medicare Other

## 2011-10-26 ENCOUNTER — Other Ambulatory Visit (HOSPITAL_BASED_OUTPATIENT_CLINIC_OR_DEPARTMENT_OTHER): Payer: Medicare Other | Admitting: Lab

## 2011-10-26 DIAGNOSIS — D509 Iron deficiency anemia, unspecified: Secondary | ICD-10-CM

## 2011-10-26 LAB — CBC WITH DIFFERENTIAL/PLATELET
BASO%: 0.3 % (ref 0.0–2.0)
LYMPH%: 31.1 % (ref 14.0–49.7)
MCHC: 33.2 g/dL (ref 31.5–36.0)
MONO#: 0.8 10*3/uL (ref 0.1–0.9)
Platelets: 246 10*3/uL (ref 145–400)
RBC: 4.23 10*6/uL (ref 3.70–5.45)
RDW: 14.6 % — ABNORMAL HIGH (ref 11.2–14.5)
WBC: 6.5 10*3/uL (ref 3.9–10.3)
lymph#: 2 10*3/uL (ref 0.9–3.3)
nRBC: 0 % (ref 0–0)

## 2011-10-27 LAB — FERRITIN: Ferritin: 658 ng/mL — ABNORMAL HIGH (ref 10–291)

## 2011-11-19 ENCOUNTER — Ambulatory Visit (HOSPITAL_BASED_OUTPATIENT_CLINIC_OR_DEPARTMENT_OTHER): Payer: Medicare Other | Admitting: Lab

## 2011-11-19 ENCOUNTER — Other Ambulatory Visit: Payer: Self-pay | Admitting: Oncology

## 2011-11-19 ENCOUNTER — Encounter: Payer: Self-pay | Admitting: Oncology

## 2011-11-19 DIAGNOSIS — B182 Chronic viral hepatitis C: Secondary | ICD-10-CM | POA: Diagnosis not present

## 2011-11-19 DIAGNOSIS — D509 Iron deficiency anemia, unspecified: Secondary | ICD-10-CM

## 2011-11-19 LAB — FERRITIN: Ferritin: 480 ng/mL — ABNORMAL HIGH (ref 10–291)

## 2011-11-19 LAB — CBC WITH DIFFERENTIAL/PLATELET
EOS%: 2.2 % (ref 0.0–7.0)
Eosinophils Absolute: 0.1 10*3/uL (ref 0.0–0.5)
MCH: 31.8 pg (ref 25.1–34.0)
MCV: 94.5 fL (ref 79.5–101.0)
MONO%: 11.3 % (ref 0.0–14.0)
NEUT#: 3.7 10*3/uL (ref 1.5–6.5)
RBC: 4.31 10*6/uL (ref 3.70–5.45)
RDW: 13.8 % (ref 11.2–14.5)

## 2011-11-19 LAB — COMPREHENSIVE METABOLIC PANEL
AST: 147 U/L — ABNORMAL HIGH (ref 0–37)
Albumin: 3.6 g/dL (ref 3.5–5.2)
Alkaline Phosphatase: 87 U/L (ref 39–117)
Potassium: 3.5 mEq/L (ref 3.5–5.3)
Sodium: 139 mEq/L (ref 135–145)
Total Protein: 6.8 g/dL (ref 6.0–8.3)

## 2011-11-19 LAB — AFP TUMOR MARKER: AFP-Tumor Marker: 72.3 ng/mL — ABNORMAL HIGH (ref 0.0–8.0)

## 2011-11-19 NOTE — Progress Notes (Signed)
Patient is positive for hepatitis C. Alpha-fetoprotein on 11/19/2011 was 72.3. I don't see any prior AFPs. In looking through the patient's record I don't see any prior imaging studies of the liver.  We will order an MRI of the liver with and without IV contrast.  The patient's next appointment to see me is on 12/18/2011. We will need to monitor the AFP.

## 2011-11-20 ENCOUNTER — Telehealth: Payer: Self-pay

## 2011-11-20 ENCOUNTER — Other Ambulatory Visit: Payer: Medicare Other | Admitting: Lab

## 2011-11-20 NOTE — Telephone Encounter (Signed)
S/w pt that Dr Arline Asp wants MRI of liver, pt questions answered, expressed understanding. Call transferred to scheduler.

## 2011-11-21 ENCOUNTER — Telehealth: Payer: Self-pay | Admitting: Oncology

## 2011-11-21 NOTE — Telephone Encounter (Signed)
called pt with mri appt

## 2011-11-28 ENCOUNTER — Ambulatory Visit (HOSPITAL_COMMUNITY)
Admission: RE | Admit: 2011-11-28 | Discharge: 2011-11-28 | Disposition: A | Discharge status: Home / Self Care | Payer: Medicare Other | Source: Ambulatory Visit | Attending: Oncology | Admitting: Oncology

## 2011-11-28 DIAGNOSIS — B182 Chronic viral hepatitis C: Secondary | ICD-10-CM | POA: Insufficient documentation

## 2011-11-28 DIAGNOSIS — B1920 Unspecified viral hepatitis C without hepatic coma: Secondary | ICD-10-CM | POA: Diagnosis not present

## 2011-11-28 MED ORDER — GADOBENATE DIMEGLUMINE 529 MG/ML IV SOLN
17.0000 mL | Freq: Once | INTRAVENOUS | Status: AC | PRN
  Administered 2011-11-28: 17 mL via INTRAVENOUS

## 2011-12-06 ENCOUNTER — Other Ambulatory Visit: Payer: Self-pay | Admitting: Oncology

## 2011-12-06 ENCOUNTER — Encounter: Payer: Self-pay | Admitting: Oncology

## 2011-12-06 DIAGNOSIS — B182 Chronic viral hepatitis C: Secondary | ICD-10-CM

## 2011-12-06 NOTE — Progress Notes (Signed)
MRI of the abdomen from 11/28/2011 was reviewed with the radiologist. There were changes in the liver or spleen adrenal glands and bone marrow suggesting iron overload. There was no evidence for cirrhosis, portal hypertension, or splenomegaly. It is somewhat difficult to reconcile these findings with the patient's past history of iron deficiency anemia. I am concerned about Wilson's disease. The patient has seen Dr. [name] in the past and we may need to refer the patient to him for consideration of a liver biopsy.  Patient is to be seen by me on 12/18/2011. I ordered a ceruloplasmin level.

## 2011-12-17 ENCOUNTER — Telehealth: Payer: Self-pay | Admitting: *Deleted

## 2011-12-17 NOTE — Telephone Encounter (Signed)
patient called in and requested to rescheduled to 01-11-2012 starting at 2:30 with lab and md at 3:00pm

## 2011-12-18 ENCOUNTER — Ambulatory Visit: Payer: Medicare Other | Admitting: Oncology

## 2011-12-18 ENCOUNTER — Other Ambulatory Visit: Payer: Medicare Other | Admitting: Lab

## 2011-12-20 ENCOUNTER — Inpatient Hospital Stay (HOSPITAL_COMMUNITY): Payer: Medicare Other

## 2011-12-20 ENCOUNTER — Encounter (HOSPITAL_COMMUNITY): Payer: Self-pay | Admitting: *Deleted

## 2011-12-20 ENCOUNTER — Inpatient Hospital Stay (HOSPITAL_COMMUNITY)
Admission: EM | Admit: 2011-12-20 | Discharge: 2011-12-25 | DRG: 378 | Disposition: A | Discharge status: Home / Self Care | Payer: Medicare Other | Attending: Internal Medicine | Admitting: Internal Medicine

## 2011-12-20 ENCOUNTER — Encounter (HOSPITAL_COMMUNITY)
Admission: EM | Disposition: A | Discharge status: Home / Self Care | Payer: Self-pay | Source: Home / Self Care | Attending: Internal Medicine

## 2011-12-20 DIAGNOSIS — Z8619 Personal history of other infectious and parasitic diseases: Secondary | ICD-10-CM | POA: Diagnosis present

## 2011-12-20 DIAGNOSIS — D72829 Elevated white blood cell count, unspecified: Secondary | ICD-10-CM | POA: Diagnosis present

## 2011-12-20 DIAGNOSIS — K649 Unspecified hemorrhoids: Secondary | ICD-10-CM | POA: Diagnosis not present

## 2011-12-20 DIAGNOSIS — K219 Gastro-esophageal reflux disease without esophagitis: Secondary | ICD-10-CM | POA: Diagnosis present

## 2011-12-20 DIAGNOSIS — R1032 Left lower quadrant pain: Secondary | ICD-10-CM | POA: Diagnosis not present

## 2011-12-20 DIAGNOSIS — I10 Essential (primary) hypertension: Secondary | ICD-10-CM | POA: Diagnosis not present

## 2011-12-20 DIAGNOSIS — K5731 Diverticulosis of large intestine without perforation or abscess with bleeding: Secondary | ICD-10-CM | POA: Diagnosis present

## 2011-12-20 DIAGNOSIS — F322 Major depressive disorder, single episode, severe without psychotic features: Secondary | ICD-10-CM

## 2011-12-20 DIAGNOSIS — S78119A Complete traumatic amputation at level between unspecified hip and knee, initial encounter: Secondary | ICD-10-CM | POA: Diagnosis not present

## 2011-12-20 DIAGNOSIS — D62 Acute posthemorrhagic anemia: Secondary | ICD-10-CM | POA: Diagnosis present

## 2011-12-20 DIAGNOSIS — D500 Iron deficiency anemia secondary to blood loss (chronic): Secondary | ICD-10-CM | POA: Diagnosis present

## 2011-12-20 DIAGNOSIS — K449 Diaphragmatic hernia without obstruction or gangrene: Secondary | ICD-10-CM | POA: Diagnosis present

## 2011-12-20 DIAGNOSIS — F17200 Nicotine dependence, unspecified, uncomplicated: Secondary | ICD-10-CM | POA: Diagnosis present

## 2011-12-20 DIAGNOSIS — D509 Iron deficiency anemia, unspecified: Secondary | ICD-10-CM | POA: Diagnosis present

## 2011-12-20 DIAGNOSIS — K922 Gastrointestinal hemorrhage, unspecified: Secondary | ICD-10-CM | POA: Diagnosis present

## 2011-12-20 DIAGNOSIS — E876 Hypokalemia: Secondary | ICD-10-CM | POA: Diagnosis present

## 2011-12-20 DIAGNOSIS — M726 Necrotizing fasciitis: Secondary | ICD-10-CM

## 2011-12-20 DIAGNOSIS — K5730 Diverticulosis of large intestine without perforation or abscess without bleeding: Secondary | ICD-10-CM | POA: Diagnosis not present

## 2011-12-20 DIAGNOSIS — K5733 Diverticulitis of large intestine without perforation or abscess with bleeding: Secondary | ICD-10-CM | POA: Diagnosis not present

## 2011-12-20 DIAGNOSIS — K625 Hemorrhage of anus and rectum: Secondary | ICD-10-CM | POA: Diagnosis present

## 2011-12-20 DIAGNOSIS — B182 Chronic viral hepatitis C: Secondary | ICD-10-CM | POA: Diagnosis present

## 2011-12-20 DIAGNOSIS — K5732 Diverticulitis of large intestine without perforation or abscess without bleeding: Secondary | ICD-10-CM | POA: Diagnosis not present

## 2011-12-20 DIAGNOSIS — Z8719 Personal history of other diseases of the digestive system: Secondary | ICD-10-CM | POA: Diagnosis present

## 2011-12-20 DIAGNOSIS — K5792 Diverticulitis of intestine, part unspecified, without perforation or abscess without bleeding: Secondary | ICD-10-CM | POA: Clinically undetermined

## 2011-12-20 DIAGNOSIS — R1031 Right lower quadrant pain: Secondary | ICD-10-CM | POA: Diagnosis not present

## 2011-12-20 DIAGNOSIS — Z9889 Other specified postprocedural states: Secondary | ICD-10-CM

## 2011-12-20 HISTORY — DX: Gastro-esophageal reflux disease without esophagitis: K21.9

## 2011-12-20 HISTORY — DX: Allergic rhinitis, unspecified: J30.9

## 2011-12-20 HISTORY — PX: ESOPHAGOGASTRODUODENOSCOPY: SHX5428

## 2011-12-20 LAB — CBC WITH DIFFERENTIAL/PLATELET
Basophils Absolute: 0 10*3/uL (ref 0.0–0.1)
Basophils Relative: 0 % (ref 0–1)
Eosinophils Absolute: 0.1 10*3/uL (ref 0.0–0.7)
Eosinophils Relative: 1 % (ref 0–5)
HCT: 34.7 % — ABNORMAL LOW (ref 36.0–46.0)
Hemoglobin: 11.9 g/dL — ABNORMAL LOW (ref 12.0–15.0)
Lymphocytes Relative: 13 % (ref 12–46)
Lymphs Abs: 1.8 10*3/uL (ref 0.7–4.0)
MCH: 31.3 pg (ref 26.0–34.0)
MCHC: 34.3 g/dL (ref 30.0–36.0)
MCV: 91.3 fL (ref 78.0–100.0)
Monocytes Absolute: 1.4 10*3/uL — ABNORMAL HIGH (ref 0.1–1.0)
Monocytes Relative: 10 % (ref 3–12)
Neutro Abs: 10.1 10*3/uL — ABNORMAL HIGH (ref 1.7–7.7)
Neutrophils Relative %: 76 % (ref 43–77)
Platelets: 278 10*3/uL (ref 150–400)
RBC: 3.8 MIL/uL — ABNORMAL LOW (ref 3.87–5.11)
RDW: 13.6 % (ref 11.5–15.5)
WBC: 13.4 10*3/uL — ABNORMAL HIGH (ref 4.0–10.5)

## 2011-12-20 LAB — COMPREHENSIVE METABOLIC PANEL
ALT: 102 U/L — ABNORMAL HIGH (ref 0–35)
AST: 104 U/L — ABNORMAL HIGH (ref 0–37)
Albumin: 3.2 g/dL — ABNORMAL LOW (ref 3.5–5.2)
Alkaline Phosphatase: 88 U/L (ref 39–117)
BUN: 8 mg/dL (ref 6–23)
CO2: 24 mEq/L (ref 19–32)
Calcium: 8.7 mg/dL (ref 8.4–10.5)
Chloride: 97 mEq/L (ref 96–112)
Creatinine, Ser: 0.53 mg/dL (ref 0.50–1.10)
GFR calc Af Amer: >90 mL/min (ref >90)
GFR calc non Af Amer: >90 mL/min (ref >90)
Glucose, Bld: 136 mg/dL — ABNORMAL HIGH (ref 70–99)
Potassium: 3.4 mEq/L — ABNORMAL LOW (ref 3.5–5.1)
Sodium: 132 mEq/L — ABNORMAL LOW (ref 135–145)
Total Bilirubin: 0.6 mg/dL (ref 0.3–1.2)
Total Protein: 7.1 g/dL (ref 6.0–8.3)

## 2011-12-20 LAB — PROTIME-INR
INR: 1.05 (ref 0.00–1.49)
Prothrombin Time: 13.9 seconds (ref 11.6–15.2)

## 2011-12-20 LAB — HEMOGLOBIN AND HEMATOCRIT, BLOOD
HCT: 24.3 % — ABNORMAL LOW (ref 36.0–46.0)
Hemoglobin: 9.3 g/dL — ABNORMAL LOW (ref 12.0–15.0)

## 2011-12-20 LAB — OCCULT BLOOD, POC DEVICE: Fecal Occult Bld: POSITIVE

## 2011-12-20 SURGERY — EGD (ESOPHAGOGASTRODUODENOSCOPY)
Anesthesia: Moderate Sedation

## 2011-12-20 MED ORDER — ONDANSETRON HCL 4 MG/2ML IJ SOLN: 4.0000 mg | Freq: Three times a day (TID) | INTRAMUSCULAR | Status: DC | PRN

## 2011-12-20 MED ORDER — PANTOPRAZOLE SODIUM 40 MG IV SOLR
40.0000 mg | Freq: Two times a day (BID) | INTRAVENOUS | Status: DC
  Administered 2011-12-20: 40 mg via INTRAVENOUS
  Filled 2011-12-20 (×2): qty 40

## 2011-12-20 MED ORDER — MORPHINE SULFATE 2 MG/ML IJ SOLN
1.0000 mg | INTRAMUSCULAR | Status: DC | PRN
  Administered 2011-12-21 – 2011-12-24 (×7): 2 mg via INTRAVENOUS
  Filled 2011-12-20 (×7): qty 1

## 2011-12-20 MED ORDER — TRAZODONE HCL 100 MG PO TABS
100.0000 mg | ORAL_TABLET | Freq: Every day | ORAL | Status: DC
  Filled 2011-12-20 (×2): qty 1

## 2011-12-20 MED ORDER — SODIUM CHLORIDE 0.9 % IV SOLN
INTRAVENOUS | Status: DC
  Administered 2011-12-20: 15:00:00 via INTRAVENOUS

## 2011-12-20 MED ORDER — FENTANYL CITRATE 0.05 MG/ML IJ SOLN
INTRAMUSCULAR | Status: DC | PRN
  Administered 2011-12-20 (×2): 25 ug via INTRAVENOUS

## 2011-12-20 MED ORDER — MIDAZOLAM HCL 10 MG/2ML IJ SOLN
INTRAMUSCULAR | Status: DC | PRN
  Administered 2011-12-20 (×3): 2 mg via INTRAVENOUS

## 2011-12-20 MED ORDER — ACETAMINOPHEN 325 MG PO TABS
650.0000 mg | ORAL_TABLET | Freq: Four times a day (QID) | ORAL | Status: DC | PRN
  Administered 2011-12-24 – 2011-12-25 (×2): 650 mg via ORAL
  Filled 2011-12-20 (×2): qty 2

## 2011-12-20 MED ORDER — SODIUM CHLORIDE 0.9 % IJ SOLN
3.0000 mL | Freq: Two times a day (BID) | INTRAMUSCULAR | Status: DC
  Administered 2011-12-21 – 2011-12-24 (×6): 3 mL via INTRAVENOUS

## 2011-12-20 MED ORDER — ONDANSETRON HCL 4 MG PO TABS: 4.0000 mg | ORAL_TABLET | Freq: Four times a day (QID) | ORAL | Status: DC | PRN

## 2011-12-20 MED ORDER — ACETAMINOPHEN 650 MG RE SUPP: 650.0000 mg | Freq: Four times a day (QID) | RECTAL | Status: DC | PRN

## 2011-12-20 MED ORDER — SODIUM CHLORIDE 0.9 % IV SOLN
INTRAVENOUS | Status: DC
  Administered 2011-12-21: 16:00:00 via INTRAVENOUS
  Administered 2011-12-21: 150 mL/h via INTRAVENOUS
  Administered 2011-12-21 – 2011-12-24 (×4): via INTRAVENOUS

## 2011-12-20 MED ORDER — SODIUM CHLORIDE 0.9 % IV SOLN: INTRAVENOUS | Status: DC

## 2011-12-20 MED ORDER — ONDANSETRON HCL 4 MG/2ML IJ SOLN: 4.0000 mg | Freq: Four times a day (QID) | INTRAMUSCULAR | Status: DC | PRN

## 2011-12-20 MED ORDER — POTASSIUM CHLORIDE 10 MEQ/100ML IV SOLN
10.0000 meq | INTRAVENOUS | Status: AC
  Administered 2011-12-20 – 2011-12-21 (×2): 10 meq via INTRAVENOUS
  Filled 2011-12-20 (×2): qty 100

## 2011-12-20 NOTE — ED Notes (Signed)
4th floor nurse given report.nuclear med will take pt to floor

## 2011-12-20 NOTE — Progress Notes (Signed)
Pt has refused for this nurse to perform assessment or to answer any questions concerning past history.  Refuses any help to assist with cleaning pads that are saturated with blood.  Continues to pass large blood clots and oozes bright red blood from rectum.  Non-compliant with nursing care and doctor's orders.  Pt states "I've been bleeding all day and no one has done a damn thing about it.  You all are leaving me here to die".  Attempted to provide emotional support; pt refuses for me to explain plan of care.

## 2011-12-20 NOTE — ED Notes (Addendum)
Introduced self to patient and asked patient name. Patient stated "I am in a lot of pain. I don't want to talk." Patient reluctantly replied with name. Patient appears ill-mannered towards staff. Both RN and Tech at bedside at event time.

## 2011-12-20 NOTE — Progress Notes (Signed)
[name], is a 61 y.o. female,   MRN: [id number]  -  DOB - 14-Dec-1950  Outpatient Primary MD for the patient is Norberto Sorenson, MD  in for    Chief Complaint  Patient presents with  . Rectal Bleeding     Blood pressure 144/74, pulse 83, temperature 99.8 F (37.7 C), temperature source Oral, resp. rate 18, SpO2 98.00%.  Principal Problem:  *BRBPR (bright red blood per rectum) Active Problems:  Essential hypertension, benign  GERD  Anemia, iron deficiency  Chronic hepatitis C without mention of hepatic coma  GI bleed  Leukocytosis  Hypokalemia     61 yo hx Gerd, duodenal AVM presents to ED cc rectal bleed.  Pt is HD stable, hemoglobin >11 and not anticoagulated aside from ASA. Pt has been having ongoing bleeding   in ED though. ED MD spoke with Dr [name]. Will admit tele, order cbc for 6pm, protonix, keep npo until GI sees.   Lesle Chris. Kristianne Albin, NP

## 2011-12-20 NOTE — ED Notes (Signed)
Pt has brown pair of shoes and dress. Pt's belongings took to room 1417

## 2011-12-20 NOTE — Consult Note (Signed)
Reason for Consult:Hematochezia Referring Physician: [facility]  [name] HPI: This is a 61 year old female who is well known to me for recurrent GI bleeding who presents with hematochezia.  Her hematochezia started at 5 AM and it was painless, but around 9 AM she started to have lower abdominal pain.  No nausea or vomiting.  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  The capsule endoscopy was negative and a second opinion EGD was obtained from [facility].  In the past she has presented with hematochezia with her AVMs.  Her colonoscopy in 2011 was negative for any AVMs or diverticula.    Past Medical History  Diagnosis Date  . Anemia, iron deficiency 05/03/2011  . Anemia   . Hypertension   . Colon polyps   . Stomach problems   . Hypokalemic alkalosis     Past Surgical History  Procedure Date  . Leg amputation above knee   . Esophagogastroduodenoscopy 05/05/2011    Procedure: ESOPHAGOGASTRODUODENOSCOPY (EGD);  Surgeon: Erick Blinks, MD;  Location: [facility];  Service: Gastroenterology;  Laterality: N/A;  Dr. Rhea Belton will do procedure for Dr. [name] Saturday.  . Esophagogastroduodenoscopy 05/08/2011    Procedure: ESOPHAGOGASTRODUODENOSCOPY (EGD);  Surgeon: Theda Belfast;  Location: [facility];  Service: Endoscopy;  Laterality: N/A;  . Esophagogastroduodenoscopy 06/07/2011    Procedure: ESOPHAGOGASTRODUODENOSCOPY (EGD);  Surgeon: Theda Belfast, MD;  Location: [facility];  Service: Endoscopy;  Laterality: N/A;  . Hot hemostasis 06/07/2011    Procedure: HOT HEMOSTASIS (ARGON PLASMA COAGULATION/BICAP);  Surgeon: Theda Belfast, MD;  Location: [facility];  Service: Endoscopy;  Laterality: N/A;    No family history on file.  Social History:  reports that she has been smoking.  She has never used smokeless tobacco. She reports that she drinks alcohol. She reports  that she uses illicit drugs (Marijuana).  Allergies:  Allergies  Allergen Reactions  . Codeine Hives and Swelling  . Penicillins Hives and Swelling    Medications:  Scheduled:   . pantoprazole (PROTONIX) IV  40 mg Intravenous Q12H   Continuous:   . sodium chloride      Results for orders placed during the hospital encounter of 12/20/11 (from the past 24 hour(s))  CBC WITH DIFFERENTIAL     Status: Abnormal   Collection Time   12/20/11 11:30 AM      Component Value Range   WBC 13.4 (*) 4.0 - 10.5 K/uL   RBC 3.80 (*) 3.87 - 5.11 MIL/uL   Hemoglobin 11.9 (*) 12.0 - 15.0 g/dL   HCT 16.1 (*) 09.6 - 04.5 %   MCV 91.3  78.0 - 100.0 fL   MCH 31.3  26.0 - 34.0 pg   MCHC 34.3  30.0 - 36.0 g/dL   RDW 40.9  81.1 - 91.4 %   Platelets 278  150 - 400 K/uL   Neutrophils Relative 76  43 - 77 %   Neutro Abs 10.1 (*) 1.7 - 7.7 K/uL   Lymphocytes Relative 13  12 - 46 %   Lymphs Abs 1.8  0.7 - 4.0 K/uL   Monocytes Relative 10  3 - 12 %   Monocytes Absolute 1.4 (*) 0.1 - 1.0 K/uL   Eosinophils Relative 1  0 - 5 %   Eosinophils Absolute 0.1  0.0 - 0.7 K/uL   Basophils Relative 0  0 - 1 %  Basophils Absolute 0.0  0.0 - 0.1 K/uL  COMPREHENSIVE METABOLIC PANEL     Status: Abnormal   Collection Time   12/20/11 11:30 AM      Component Value Range   Sodium 132 (*) 135 - 145 mEq/L   Potassium 3.4 (*) 3.5 - 5.1 mEq/L   Chloride 97  96 - 112 mEq/L   CO2 24  19 - 32 mEq/L   Glucose, Bld 136 (*) 70 - 99 mg/dL   BUN 8  6 - 23 mg/dL   Creatinine, Ser 1.61  0.50 - 1.10 mg/dL   Calcium 8.7  8.4 - 09.6 mg/dL   Total Protein 7.1  6.0 - 8.3 g/dL   Albumin 3.2 (*) 3.5 - 5.2 g/dL   AST 045 (*) 0 - 37 U/L   ALT 102 (*) 0 - 35 U/L   Alkaline Phosphatase 88  39 - 117 U/L   Total Bilirubin 0.6  0.3 - 1.2 mg/dL   GFR calc non Af Amer >90  >90 mL/min   GFR calc Af Amer >90  >90 mL/min  PROTIME-INR     Status: Normal   Collection Time   12/20/11 12:04 PM      Component Value Range   Prothrombin Time  13.9  11.6 - 15.2 seconds   INR 1.05  0.00 - 1.49  OCCULT BLOOD, POC DEVICE     Status: Normal   Collection Time   12/20/11 12:17 PM      Component Value Range   Fecal Occult Bld POSITIVE       No results found.  ROS:  As stated above in the HPI otherwise negative.  Blood pressure 144/74, pulse 83, temperature 99.8 F (37.7 C), temperature source Oral, resp. rate 18, SpO2 98.00%.    PE: Gen: NAD, Alert and Oriented HEENT:  [place]/AT, EOMI Neck: Supple, no LAD Lungs: CTA Bilaterally CV: RRR without M/G/R ABM: Soft, tender in the lower abdomen bilaterally, +BS Ext: No C/C/E  Assessment/Plan: 1) Hematochezia. 2) Lower abdominal pain.   Finding the source of bleeding and arresting the site has been very difficult.  The lower abdominal pain is new and I am wondering if this may be a presentation with ischemic colitis.  I will order an RBC scan to see if the site can be localized for the bleeding.  Plan: 1) RBC scan. 2) Clear liquids after the scan.  [name] D 12/20/2011, 3:11 PM

## 2011-12-20 NOTE — H&P (Signed)
[facility] History and Physical  [name]:[id number] DOB: 03/07/1951 DOA: 12/20/2011  Referring physician: Dr Juleen China PCP: Norberto Sorenson, MD   Chief Complaint: HEMATOCHEZIA  HPI:  The patient is a 61 year old black female with past medical history significant for recurrent GI bleeding found to have bleeding AVMs in the second portion of the duodenum in the past, but per Dr. [name] her last 3 EGDs have identified no bleeding source, also history of iron deficiency anemia,GERD and she presents with above complaints. She states that she began bleeding at 5 AM and it has worsened through the course of the day, since arrival on the floor per nursing staff she has had 5-6 episodes of rectal bleeding -large dark red/maroon clots(all blood with no stool mixed in) she reports that she's also had abdominal pain-lower abdomen, initially on the left and now on the right side. She is expressing frustration that this bleeding has been going on for some time and so far has not been stopped. She declined her physical exam is the result, stating that she does not want to be bothered. She also was not cooperative with giving any further history.  Review of Systems:  The patient denies anorexia, fever, weight loss,, vision loss, decreased hearing, hoarseness, chest pain, syncope, dyspnea on exertion, peripheral edema, balance deficits, hemoptysis, severe indigestion/heartburn, hematuria, muscle weakness, suspicious skin lesions, transient blindness, difficulty walking, depression, unusual weight change.   Past Medical History  Diagnosis Date  . Anemia, iron deficiency 05/03/2011  . Anemia   . Hypertension   . Colon polyps   . Stomach problems   . Hypokalemic alkalosis    Past Surgical History  Procedure Date  . Leg amputation above knee   . Esophagogastroduodenoscopy 05/05/2011    Procedure: ESOPHAGOGASTRODUODENOSCOPY (EGD);  Surgeon: Erick Blinks, MD;  Location: [facility];  Service:  Gastroenterology;  Laterality: N/A;  Dr. Rhea Belton will do procedure for Dr. [name] Saturday.  . Esophagogastroduodenoscopy 05/08/2011    Procedure: ESOPHAGOGASTRODUODENOSCOPY (EGD);  Surgeon: Theda Belfast;  Location: [facility];  Service: Endoscopy;  Laterality: N/A;  . Esophagogastroduodenoscopy 06/07/2011    Procedure: ESOPHAGOGASTRODUODENOSCOPY (EGD);  Surgeon: Theda Belfast, MD;  Location: [facility];  Service: Endoscopy;  Laterality: N/A;  . Hot hemostasis 06/07/2011    Procedure: HOT HEMOSTASIS (ARGON PLASMA COAGULATION/BICAP);  Surgeon: Theda Belfast, MD;  Location: [facility];  Service: Endoscopy;  Laterality: N/A;   Social History:  reports that she has been smoking.  She has never used smokeless tobacco. She reports that she drinks alcohol. She reports that she uses illicit drugs (Marijuana). where does patient live--home,independent with ADLS   Allergies  Allergen Reactions  . Codeine Hives and Swelling  . Penicillins Hives and Swelling    Patient not cooperative with giving any family history.  Prior to Admission medications   Medication Sig Start Date End Date Taking? Authorizing Provider  aspirin EC 81 MG tablet Take 162 mg by mouth 2 (two) times daily.   Yes Historical Provider, MD  atenolol (TENORMIN) 50 MG tablet Take 50 mg by mouth daily.   Yes Historical Provider, MD  cetirizine (ZYRTEC) 10 MG tablet Take 10 mg by mouth daily.     Yes Historical Provider, MD  esomeprazole (NEXIUM) 40 MG capsule Take 40 mg by mouth daily before breakfast.   Yes Historical Provider, MD  gabapentin (NEURONTIN) 300 MG capsule Take 600 mg by mouth 3 (three) times daily.     Yes Historical Provider, MD  lisinopril (  PRINIVIL,ZESTRIL) 40 MG tablet Take 40 mg by mouth daily.     Yes Historical Provider, MD  traMADol (ULTRAM) 50 MG tablet Take 50 mg by mouth every 6 (six) hours as needed. For pain.Maximum dose= 8 tablets per day    Yes Historical Provider, MD  traZODone (DESYREL) 100 MG  tablet Take 100 mg by mouth at bedtime.     Yes Historical Provider, MD  vitamin C (ASCORBIC ACID) 500 MG tablet Take 500 mg by mouth daily.   Yes Historical Provider, MD   Physical Exam: Filed Vitals:   12/20/11 1116 12/20/11 1500 12/20/11 1755  BP: 144/74 133/71 126/78  Pulse: 83 86   Temp: 99.8 F (37.7 C)  99.5 F (37.5 C)  TempSrc: Oral  Oral  Resp: 18 20 18   Height:   5\' 3"  (1.6 m)  Weight:   83.462 kg (184 lb)  SpO2: 98% 97% 94%   Constitutional: Vital signs reviewed.  Patient is a well-developed and well-nourished in no acute distress and  Alert and oriented x3. She is very irritable, and she keeps her head covered for the most part, just occasionally pulling down the sheets; not cooperative with exam, she declines the physical exam as above stating she does not want to be bothered.   Labs on Admission:  Basic Metabolic Panel:  Lab 12/20/11 0981  NA 132*  K 3.4*  CL 97  CO2 24  GLUCOSE 136*  BUN 8  CREATININE 0.53  CALCIUM 8.7  MG --  PHOS --   Liver Function Tests:  Lab 12/20/11 1130  AST 104*  ALT 102*  ALKPHOS 88  BILITOT 0.6  PROT 7.1  ALBUMIN 3.2*   No results found for this basename: LIPASE:5,AMYLASE:5 in the last 168 hours No results found for this basename: AMMONIA:5 in the last 168 hours CBC:  Lab 12/20/11 1130  WBC 13.4*  NEUTROABS 10.1*  HGB 11.9*  HCT 34.7*  MCV 91.3  PLT 278   Cardiac Enzymes: No results found for this basename: CKTOTAL:5,CKMB:5,CKMBINDEX:5,TROPONINI:5 in the last 168 hours  BNP (last 3 results) No results found for this basename: PROBNP:3 in the last 8760 hours CBG: No results found for this basename: GLUCAP:5 in the last 168 hours  Radiological Exams on Admission: Nm Gi Blood Loss  12/20/2011  *RADIOLOGY REPORT*  Clinical Data: Recurrent GI bleeding  NUCLEAR MEDICINE GASTROINTESTINAL BLEEDING STUDY  Technique:  Sequential abdominal images were obtained following intravenous administration of Tc-72m labeled red  blood cells.  Radiopharmaceutical:  22 mCi of technetium 64m tagged red blood cells.  Comparison: None  Findings: Following the intravenous administration of the radiopharmaceutical there is diffuse and intense radiotracer uptake by the stomach.  There is increased radiotracer uptake within the stomach and proximal small bowel loops. On the dynamic imaging there is antegrade flow of the radiotracer within the jejunum.   No abnormal focus of increased radiotracer uptake identified within the colon to suggest a lower GI bleed.  IMPRESSION:  1.  Increased radiotracer uptake within the proximal small bowel loops which may be compatible with acute proximal small bowel hemorrhage. Differential considerations include gastric ulcer or erosive gastritis.  Original Report Authenticated By: [name], M.D.     Assessment/Plan Principal Problem:  *Rectal bleeding -As discussed above, monitor serial H&H's and transfuse as clinically appropriate. -fluid resuscitation -Patient seen by GI in the ED and tagged red cell scan done as above reveals-increased radiotracer uptake within the proximal small bowel loops which may be  compatible with acute proximal small bowel hemorrhage- gasstric ulcer versus erosive gastritis in differential per radiology -Place patient on IV PPI -Above results discussed with Dr. [name] and he will come in to do an endoscopy on patient this p.m. Active Problems:  Essential hypertension, benign -Holding off antihypertensives secondary to above, monitor and treat accordingly  GERD -PPI as above  Anemia, iron deficiency  Chronic hepatitis C without mention of hepatic coma Leukocytosis -Possibly reactive,Obtain UA, follow recheck and further manage accordingly  Hypokalemia -Replace the potassium.  Code Status: Full code  Disposition Plan: Admitted to telemetry  Time spent: >3mins  Kela Millin [facility] Pager [phone]  If 7PM-7AM, please contact  night-coverage [email or web address] Password [facility] 12/20/2011, 7:29 PM

## 2011-12-20 NOTE — ED Notes (Signed)
Pt states she has had rectal bleeding since 5am this morning. Pt is very difficult to get information from. Pt states she a long night. Pt will not let staff assist her with undressing.

## 2011-12-20 NOTE — ED Notes (Signed)
Patient in nuclear med.  

## 2011-12-20 NOTE — ED Notes (Signed)
Patient apologizes for previous behavior. Patient states "My fiance died on the 14-Oct-2022. Our wedding day is today. His son was going to walk me down the aisle" Patient is sobbing. Gathered tissue for patient. Patient stops crying and is compliant with staff and procedures.

## 2011-12-20 NOTE — ED Notes (Signed)
Pt was unable to go to nuclear med due to 3 bowel movements with blood in stool

## 2011-12-20 NOTE — ED Provider Notes (Signed)
History    61yF with rectal bleeding. Onset early this morning. "I'm just passing straight blood." Crampy L sided abdominal pain. No bleeding from anywhere else. No dizziness, lightheadedness or SOB. No n/v. Pt with hx of duodenal AVM and known to Dr [name], GI. Initially presented at that time with melena. Takes 81mg  aspirin daily, otherwise no thinners.    CSN: [id number]  Arrival date & time 12/20/11  1058   First MD Initiated Contact with Patient 12/20/11 1132      Chief Complaint  Patient presents with  . Rectal Bleeding    (Consider location/radiation/quality/duration/timing/severity/associated sxs/prior treatment) HPI  Past Medical History  Diagnosis Date  . Anemia, iron deficiency 05/03/2011  . Anemia   . Hypertension   . Colon polyps   . Stomach problems   . Hypokalemic alkalosis     Past Surgical History  Procedure Date  . Leg amputation above knee   . Esophagogastroduodenoscopy 05/05/2011    Procedure: ESOPHAGOGASTRODUODENOSCOPY (EGD);  Surgeon: Erick Blinks, MD;  Location: [facility];  Service: Gastroenterology;  Laterality: N/A;  Dr. Rhea Belton will do procedure for Dr. [name] Saturday.  . Esophagogastroduodenoscopy 05/08/2011    Procedure: ESOPHAGOGASTRODUODENOSCOPY (EGD);  Surgeon: Theda Belfast;  Location: [facility];  Service: Endoscopy;  Laterality: N/A;  . Esophagogastroduodenoscopy 06/07/2011    Procedure: ESOPHAGOGASTRODUODENOSCOPY (EGD);  Surgeon: Theda Belfast, MD;  Location: [facility];  Service: Endoscopy;  Laterality: N/A;  . Hot hemostasis 06/07/2011    Procedure: HOT HEMOSTASIS (ARGON PLASMA COAGULATION/BICAP);  Surgeon: Theda Belfast, MD;  Location: [facility];  Service: Endoscopy;  Laterality: N/A;    No family history on file.  History  Substance Use Topics  . Smoking status: Current Some Day Smoker -- 0.5 packs/day  . Smokeless tobacco: Never Used  . Alcohol Use: Yes     occasionally    OB History    Grav Para Term Preterm Abortions  TAB SAB Ect Mult Living                  Review of Systems   Review of symptoms negative unless otherwise noted in HPI.   Allergies  Codeine and Penicillins  Home Medications   Current Outpatient Rx  Name Route Sig Dispense Refill  . ASPIRIN EC 81 MG PO TBEC Oral Take 162 mg by mouth 2 (two) times daily.    . ATENOLOL 50 MG PO TABS Oral Take 50 mg by mouth daily.    Marland Kitchen CETIRIZINE HCL 10 MG PO TABS Oral Take 10 mg by mouth daily.      Marland Kitchen ESOMEPRAZOLE MAGNESIUM 40 MG PO CPDR Oral Take 40 mg by mouth daily before breakfast.    . GABAPENTIN 300 MG PO CAPS Oral Take 600 mg by mouth 3 (three) times daily.      Marland Kitchen LISINOPRIL 40 MG PO TABS Oral Take 40 mg by mouth daily.      . TRAMADOL HCL 50 MG PO TABS Oral Take 50 mg by mouth every 6 (six) hours as needed. For pain.Maximum dose= 8 tablets per day     . TRAZODONE HCL 100 MG PO TABS Oral Take 100 mg by mouth at bedtime.      Marland Kitchen VITAMIN C 500 MG PO TABS Oral Take 500 mg by mouth daily.      BP 144/74  Pulse 83  Temp 99.8 F (37.7 C) (Oral)  Resp 18  SpO2 98%  Physical Exam  Nursing note and vitals reviewed. Constitutional: She appears well-developed  and well-nourished. No distress.       Laying in bed. NAD. Obese.  HENT:  Head: Normocephalic and atraumatic.  Eyes: Conjunctivae are normal. Right eye exhibits no discharge. Left eye exhibits no discharge.  Neck: Neck supple.  Cardiovascular: Normal rate, regular rhythm and normal heart sounds.  Exam reveals no gallop and no friction rub.   No murmur heard. Pulmonary/Chest: Effort normal and breath sounds normal. No respiratory distress.  Abdominal: Soft. She exhibits no distension. There is tenderness.       Mild L sided abdominal tenderness without rebound or guarding. No mass.  Genitourinary:       No external rectal lesions noted. No masses palpated. No stool in rectum. Gross blood noted.  Musculoskeletal: She exhibits no edema and no tenderness.  Neurological: She is alert.    Skin: Skin is warm and dry.  Psychiatric: She has a normal mood and affect. Her behavior is normal. Thought content normal.    ED Course  Procedures (including critical care time)  Labs Reviewed  CBC WITH DIFFERENTIAL - Abnormal; Notable for the following:    WBC 13.4 (*)     RBC 3.80 (*)     Hemoglobin 11.9 (*)     HCT 34.7 (*)     Neutro Abs 10.1 (*)     Monocytes Absolute 1.4 (*)     All other components within normal limits  COMPREHENSIVE METABOLIC PANEL - Abnormal; Notable for the following:    Sodium 132 (*)     Potassium 3.4 (*)     Glucose, Bld 136 (*)     Albumin 3.2 (*)     AST 104 (*)     ALT 102 (*)     All other components within normal limits  PROTIME-INR  OCCULT BLOOD X 1 CARD TO LAB, STOOL   No results found.   1. Rectal bleed       MDM  61yF with rectal bleeding. Suspect lower GI as opposed to brisk upper. Pt is HD stable, hemoglobin ok and not anticoagulated aside from ASA. Pt has been having ongoing bleeding in ED though. Because of this will admit. No indication for emergent GI consult, but since known to [place] will discuss.    1:15 PM Discussed with Dr [name].      Raeford Razor, MD 12/20/11 [phone]

## 2011-12-20 NOTE — Op Note (Signed)
[facility] [address] [place], [place]  [address]  OPERATIVE PROCEDURE REPORT  PATIENT:  [name], [name]  MR#:  [id number] BIRTHDATE:  [birth year]  GENDER:  female ENDOSCOPIST:  Jeani Hawking, MD ASSISTANT:  Beryle Beams, Technician and Dwain Sarna, RN CGRN PROCEDURE DATE:  12/20/2011 PROCEDURE:  EGD, diagnostic [phone] ASA CLASS:  Class III INDICATIONS:  GI bleed and abnormal bleeding scan MEDICATIONS:  Fentanyl 50 mcg IV, Versed 4 mg IV  DESCRIPTION OF PROCEDURE:   After the risks benefits and alternatives of the procedure were thoroughly explained, informed consent was obtained.  The [id number] endoscope was introduced through the mouth and advanced to the second portion of the duodenum, without limitations.  The instrument was slowly withdrawn as the mucosa was fully examined. <<PROCEDUREIMAGES>>  FINDINGS:  A small 2 cm hiatal hernia was identified. There was no trace of blood in the upper GI tract. No evidence of any ulcerations, erosions, vascular abnormalities, polyps, or masses. Retroflexed views revealed no abnormalities.    The scope was then withdrawn from the patient and the procedure terminated.  COMPLICATIONS:  None  IMPRESSION:  1) Hiatal hernia RECOMMENDATIONS:  1) Follow HGB. 2) Transfuse as necessary. 3) I will speak with Radiology about the current findings and other diagnostic work up.  ______________________________ Jeani Hawking, MD  n. Rosalie Doctor:   Jeani Hawking at 12/20/2011 09:28 PM  Claiborne Billings, [phone]

## 2011-12-21 ENCOUNTER — Encounter (HOSPITAL_COMMUNITY): Payer: Self-pay

## 2011-12-21 ENCOUNTER — Encounter (HOSPITAL_COMMUNITY): Payer: Self-pay | Admitting: Gastroenterology

## 2011-12-21 ENCOUNTER — Encounter (HOSPITAL_COMMUNITY)
Admission: EM | Disposition: A | Discharge status: Home / Self Care | Payer: Self-pay | Source: Home / Self Care | Attending: Internal Medicine

## 2011-12-21 DIAGNOSIS — I10 Essential (primary) hypertension: Secondary | ICD-10-CM | POA: Diagnosis not present

## 2011-12-21 DIAGNOSIS — D62 Acute posthemorrhagic anemia: Secondary | ICD-10-CM | POA: Diagnosis not present

## 2011-12-21 DIAGNOSIS — K649 Unspecified hemorrhoids: Secondary | ICD-10-CM | POA: Diagnosis not present

## 2011-12-21 DIAGNOSIS — K5733 Diverticulitis of large intestine without perforation or abscess with bleeding: Secondary | ICD-10-CM | POA: Diagnosis not present

## 2011-12-21 DIAGNOSIS — K625 Hemorrhage of anus and rectum: Secondary | ICD-10-CM | POA: Diagnosis not present

## 2011-12-21 DIAGNOSIS — E876 Hypokalemia: Secondary | ICD-10-CM | POA: Diagnosis not present

## 2011-12-21 HISTORY — PX: FLEXIBLE SIGMOIDOSCOPY: SHX5431

## 2011-12-21 LAB — CBC
HCT: 26.8 % — ABNORMAL LOW (ref 36.0–46.0)
Hemoglobin: 9.4 g/dL — ABNORMAL LOW (ref 12.0–15.0)
MCH: 30.6 pg (ref 26.0–34.0)
MCHC: 35.1 g/dL (ref 30.0–36.0)
Platelets: 221 10*3/uL (ref 150–400)
RDW: 15.6 % — ABNORMAL HIGH (ref 11.5–15.5)

## 2011-12-21 LAB — BASIC METABOLIC PANEL
BUN: 6 mg/dL (ref 6–23)
Chloride: 103 mEq/L (ref 96–112)
Creatinine, Ser: 0.61 mg/dL (ref 0.50–1.10)
Glucose, Bld: 92 mg/dL (ref 70–99)
Potassium: 3.1 mEq/L — ABNORMAL LOW (ref 3.5–5.1)

## 2011-12-21 SURGERY — EGD (ESOPHAGOGASTRODUODENOSCOPY)
Anesthesia: Moderate Sedation

## 2011-12-21 SURGERY — SIGMOIDOSCOPY, FLEXIBLE
Anesthesia: Moderate Sedation

## 2011-12-21 MED ORDER — ACETAMINOPHEN 325 MG PO TABS
650.0000 mg | ORAL_TABLET | Freq: Once | ORAL | Status: DC
  Filled 2011-12-21: qty 2

## 2011-12-21 MED ORDER — MIDAZOLAM HCL 10 MG/2ML IJ SOLN
INTRAMUSCULAR | Status: DC | PRN
  Administered 2011-12-21 (×2): 2 mg via INTRAVENOUS

## 2011-12-21 MED ORDER — GABAPENTIN 300 MG PO CAPS
300.0000 mg | ORAL_CAPSULE | Freq: Three times a day (TID) | ORAL | Status: DC
  Administered 2011-12-21 – 2011-12-23 (×6): 300 mg via ORAL
  Filled 2011-12-21 (×8): qty 1

## 2011-12-21 MED ORDER — DIPHENHYDRAMINE HCL 50 MG/ML IJ SOLN
INTRAMUSCULAR | Status: AC
  Filled 2011-12-21: qty 1

## 2011-12-21 MED ORDER — MIDAZOLAM HCL 10 MG/2ML IJ SOLN
INTRAMUSCULAR | Status: AC
  Filled 2011-12-21: qty 4

## 2011-12-21 MED ORDER — PANTOPRAZOLE SODIUM 40 MG IV SOLR
40.0000 mg | Freq: Two times a day (BID) | INTRAVENOUS | Status: DC
  Administered 2011-12-21 – 2011-12-25 (×8): 40 mg via INTRAVENOUS
  Filled 2011-12-21 (×10): qty 40

## 2011-12-21 MED ORDER — DIPHENHYDRAMINE HCL 25 MG PO CAPS
25.0000 mg | ORAL_CAPSULE | Freq: Once | ORAL | Status: AC
  Administered 2011-12-21: 25 mg via ORAL
  Filled 2011-12-21: qty 1

## 2011-12-21 MED ORDER — POTASSIUM CHLORIDE CRYS ER 20 MEQ PO TBCR
40.0000 meq | EXTENDED_RELEASE_TABLET | ORAL | Status: AC
  Administered 2011-12-22 (×3): 40 meq via ORAL
  Filled 2011-12-21 (×3): qty 2

## 2011-12-21 MED ORDER — FENTANYL CITRATE 0.05 MG/ML IJ SOLN
INTRAMUSCULAR | Status: DC | PRN
  Administered 2011-12-21 (×2): 25 ug via INTRAVENOUS

## 2011-12-21 MED ORDER — FENTANYL CITRATE 0.05 MG/ML IJ SOLN
INTRAMUSCULAR | Status: AC
  Filled 2011-12-21: qty 4

## 2011-12-21 NOTE — Progress Notes (Signed)
Most recent Hgb result of 8.3 called to L Harduk PA.  Pt continues to refuse care.  Instructed on necessity of telemetry monitoring; pt refuses, stating "you will not put that on me".  I informed her that she would be needing a blood transfusion throughout the night; pt states "no I will not, I don't want that, I don't want anything to do with any of you".  When instructed on rationale, pt covers her head with blankets and refuses to talk any further.

## 2011-12-21 NOTE — Clinical Documentation Improvement (Signed)
Anemia Blood Loss Clarification  THIS DOCUMENT IS NOT A PERMANENT PART OF THE MEDICAL RECORD  RESPOND TO THE THIS QUERY, FOLLOW THE INSTRUCTIONS BELOW:  1. If needed, update documentation for the patient's encounter via the notes activity.  2. Access this query again and click edit on the In [employer].  3. After updating, or not, click F2 to complete all highlighted (required) fields concerning your review. Select "additional documentation in the medical record" OR "no additional documentation provided".  4. Click Sign note button.  5. The deficiency will fall out of your In Basket *Please let us know if you are not able to complete this workflow by phone or e-mail (listed below).        12/21/11  Dear Dr. Suanne Marker, A/Associates  In an effort to better capture your patient's severity of illness, reflect appropriate length of stay and utilization of resources, a review of the patient medical record has revealed the following indicators.    Based on your clinical judgment, please clarify and document in a progress note and/or discharge summary the clinical condition associated with the following supporting information:  In responding to this query please exercise your independent judgment.  The fact that a query is asked, does not imply that any particular answer is desired or expected.  Pt with BRBPR/GIB.  According to pn pt with Anemia "iron deficiency"   Please clarify if Anemia "iron deficiency" in setting of can be further specify as a more acute condition as listed below and document in pn or d/c summary.   Thank you for all that you do for our patients!    Possible Clinical Conditions?   " Expected Acute Blood Loss Anemia  " Acute Blood Loss Anemia  " Acute on chronic blood loss anemia   " Other Condition________________  " Cannot Clinically Determine  Risk Factors: (recent surgery, pre op anemia, EBL in OR)      Supporting Information:  Signs and  Symptoms  PN 12/20/11  *BRBPR (bright red blood per rectum), Anemia, iron deficiency, GI bleed   Diagnostics: EGD 12/20/11 Component      Hemoglobin HCT  Latest Ref Rng      12.0 - 15.0 g/dL 16.1 - 09.6 %  0/45/4098     11:30 AM 11.9 (L) 34.7 (L)  12/20/2011     7:42 PM 9.3 (L) 27.1 (L)  12/20/2011     11:30 PM 8.3 (L) 24.3 (L)   Treatments: Transfusion: 2 U PRBCS IV fluids: IVF Serial H&H monitoring Medications (Fe, Procrit)  Reviewed: additional documentation in the medical record ljh   Thank You,  Enis Slipper  RN, BSN, MSN/Inf, CCDS Clinical Documentation Specialist [facility] Pager: [phone] / E-mail: [email or web address]  Health Information Management [place]

## 2011-12-21 NOTE — Progress Notes (Signed)
Patient ID: [name], female   DOB: Mar 10, 1951, 61 y.o.   MRN: [id number]  Pt was admitted around 2300 after her EGD was complete. She has continued to have BRBPR in large amounts since then. At midnight her hgb was a little over 8. At 2300 her SBP was in the low 100s. Unfortunately the patient has been refusing all care including being cleaned, checking vital signs, physical assessment, and is now refusing blood transfusion and transfer to the step down unit. She claims that we are not helping her but is resistant to care. When discussing her need for a blood transfusion, she stated that she did not want to get HIV. I explained that this risk was extremely minimal and that she could die without it. She still refused and asked me to leave the room and turn the lights off so she could sleep. At 0130 I spoke with the patient's sister who states that something similar happened in the past in which the patient refused care and ended up having to have emergent amputation of her leg as a result. She did however just bury her boyfriend on 7/24, so we are not sure how much this is contributing to her current mental state. The niece is going to call the patient and try to get her to accept our care, particularly the blood products. If this is not successful will need to obtain a psych consult to confirm patient's competence.

## 2011-12-21 NOTE — Progress Notes (Signed)
Pt has slept intermittently for the past few hours.  Not very talkative this am, not wanting to answer questions, but no longer refusing care at present time.  Allows nursing staff to assist with cleaning, do vitals.  2nd unit PRBC transfusion started.  Has continued to pass large amounts of bright red blood with multiple large clots from rectum several times throughout the night.

## 2011-12-21 NOTE — Op Note (Signed)
[facility] [address] [place], [place]  [address]  FLEXIBLE SIGMOIDOSCOPY PROCEDURE REPORT  PATIENT:  [name], [name]  MR#:  [id number] BIRTHDATE:  [birth year], 61 yrs. old  GENDER:  female ENDOSCOPIST:  Jeani Hawking, MD PROCEDURE DATE:  12/21/2011 PROCEDURE:  Flexible Sigmoidoscopy, diagnostic ASA CLASS:  Class III INDICATIONS:  Hematochezia MEDICATIONS:   Fentanyl 50 mcg IV, Versed 4 mg IV  DESCRIPTION OF PROCEDURE:   After the risks benefits and alternatives of the procedure were thoroughly explained, informed consent was obtained.  Digital rectal exam was performed and revealed no abnormalities.   The Pentax Ped Colon [id number] endoscope was introduced through the anus and advanced to the transverse colon, without limitations.  The quality of the prep was Unprepped.  The instrument was then slowly withdrawn as the mucosa was fully examined. <<PROCEDUREIMAGES>>  FINDINGS:  Upon initial entry into the colonic lumen there was evidence of blood and clots. Diverticula were identified in the left side of the colon, but there were only a few. In the distal transverse colon there was no evidence of any blood. From these findings it is clear that she had a left-sided diverticular bleed. No evidence of any ulcerations, erosions, vascular abnormalities, polyps, or masses.   Retroflexed views in the rectum revealed small hemorrhoids.    The scope was then withdrawn from the patient and the procedure terminated.  COMPLICATIONS:  None  ENDOSCOPIC IMPRESSION: 1) Mild diverticulosis - Findings consistent with a left-sided diverticular bleed. 2) Small hemorrhoids RECOMMENDATIONS: 1) Follow HGB. 2) Transfuse as necessary.  ______________________________ Jeani Hawking, MD  n. Rosalie DoctorJeani Hawking at 12/21/2011 11:35 AM  Claiborne Billings, [phone]

## 2011-12-21 NOTE — Interval H&P Note (Signed)
History and Physical Interval Note:  12/21/2011 10:48 AM  [name]  has presented today for surgery, with the diagnosis of Hematochezia  The various methods of treatment have been discussed with the patient and family. After consideration of risks, benefits and other options for treatment, the patient has consented to  Procedure(s) (LRB): COLONOSCOPY (N/A) as a surgical intervention .  The patient's history has been reviewed, patient examined, no change in status, stable for surgery.  I have reviewed the patient's chart and labs.  Questions were answered to the patient's satisfaction.     Trimaine Maser D

## 2011-12-21 NOTE — Progress Notes (Addendum)
[facility] PROGRESS NOTE  [name] WUJ:[id number] DOB: Apr 03, 1951 DOA: 12/20/2011 PCP: Norberto Sorenson, MD  Assessment/Plan: Principal Problem:  *BRBPR (bright red blood per rectum) Active Problems:  Essential hypertension, benign  GERD  Anemia, iron deficiency  Chronic hepatitis C without mention of hepatic coma  GI bleed  Leukocytosis  Hypokalemia  Rectal bleeding  *Rectal bleeding  --Patient seen by GI in the ED and tagged red cell scan done as above reveals-increased radiotracer uptake within the proximal small bowel loops which may be compatible with acute proximal small bowel hemorrhage- gasstric ulcer versus erosive gastritis in differential per radiology  -s/p EGD last pm -no bleeding source -s/p colonoscopy this am Mild diverticulosis - Findings consistent with a left-sided diverticular bleed -pt finally agreed to transfusion and being as appropriate  -continue to monitor serial H&H's and transfuse as clinically appropriate. -continue  PPI  Active Problems:  Anemia, Acute blood loss -2/2 to above -as above continue to follow and transfuse as needed Essential hypertension, benign  -Holding off antihypertensives secondary to above, monitor and treat accordingly  GERD  -PPI as above  Chronic hepatitis C without mention of hepatic coma  Leukocytosis  -Possibly reactive- improving on no abx, still awaiting UA,  Hypokalemia  -Replace the potassium.  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 [name] her last 3 EGDs have identified no bleeding source, also history of iron deficiency anemia,GERD and presented with rectal bleeding. She reported that it worsened through the course of the day, had 5-6 episodes of rectal bleeding -large dark red/maroon clots(all blood with no  stool mixed in) shortly after she arrived on the floor. She admitted to lower abd pain.. She declined her physical exam initially and also was refusing transfusions but beginning this am 7/26 - more cooperative.GI was consulted on admission and tagged redcell sca was done initially see results below, an EGD was done on admission- no bleeding and colonoscopy today 7/26 with Mild diverticulosis - Findings consistent with a left-sided diverticular bleed.   Consultants:  GI - Dr [name]  Procedures:  EGD FINDINGS: A small 2 cm hiatal hernia was identified. There was no  trace of blood in the upper GI tract. No evidence of any  ulcerations, erosions, vascular abnormalities, polyps, or masses.  Retroflexed views revealed no abnormalities. The scope was then  withdrawn from the patient and the procedure terminated.  COMPLICATIONS: None  IMPRESSION: 1) Hiatal hernia  RECOMMENDATIONS: 1) Follow HGB.  2) Transfuse as necessary  Colonoscopy - 7/26 ENDOSCOPIC IMPRESSION:  1) Mild diverticulosis - Findings consistent with a left-sided  diverticular bleed.  2) Small hemorrhoids  RECOMMENDATIONS:  1) Follow HGB.  2) Transfuse as necessary.   Antibiotics:  none  HPI/Subjective: S/p colonoscopy, c/o lower abd pain, states no further rectal bleeding since colonoscopy.  Objective: Filed Vitals:   12/21/11 0745 12/21/11 0805 12/21/11 0845 12/21/11 0910  BP:  130/86 128/88 122/82  Pulse: 86  88 84  Temp: 99 F (37.2 C)  99 F (37.2 C) 98.1 F (36.7 C)  TempSrc: Oral  Oral Oral  Resp: 18  18 24   Height:      Weight:      SpO2:        Intake/Output Summary (Last 24 hours) at 12/21/11 1016 Last  data filed at 12/21/11 0825  Gross per 24 hour  Intake    645 ml  Output      0 ml  Net    645 ml    Exam:   General: alert, more cooperative today, in NAD  Cardiovascular: RRR  Respiratory: clear  Abdomen:soft, BS present,ND +lower abd tenderness  EXT: no edema  Data  Reviewed: Basic Metabolic Panel:  Lab 12/20/11 1610  NA 132*  K 3.4*  CL 97  CO2 24  GLUCOSE 136*  BUN 8  CREATININE 0.53  CALCIUM 8.7  MG --  PHOS --   Liver Function Tests:  Lab 12/20/11 1130  AST 104*  ALT 102*  ALKPHOS 88  BILITOT 0.6  PROT 7.1  ALBUMIN 3.2*   No results found for this basename: LIPASE:5,AMYLASE:5 in the last 168 hours No results found for this basename: AMMONIA:5 in the last 168 hours CBC:  Lab 12/20/11 2330 12/20/11 1942 12/20/11 1130  WBC -- -- 13.4*  NEUTROABS -- -- 10.1*  HGB 8.3* 9.3* 11.9*  HCT 24.3* 27.1* 34.7*  MCV -- -- 91.3  PLT -- -- 278   Cardiac Enzymes: No results found for this basename: CKTOTAL:5,CKMB:5,CKMBINDEX:5,TROPONINI:5 in the last 168 hours BNP (last 3 results) No results found for this basename: PROBNP:3 in the last 8760 hours CBG: No results found for this basename: GLUCAP:5 in the last 168 hours  No results found for this or any previous visit (from the past 240 hour(s)).   Studies: Mr Abdomen W Wo Contrast  11/28/2011  *RADIOLOGY REPORT*  Clinical Data: Chronic hepatitis C with elevated alpha-fetoprotein level.  Evaluate for possible hepatocellular carcinoma.  MRI ABDOMEN WITH AND WITHOUT CONTRAST  Technique:  Multiplanar multisequence MR imaging of the abdomen was performed both before and after administration of intravenous contrast.  Contrast: 17mL MULTIHANCE GADOBENATE DIMEGLUMINE 529 MG/ML IV SOLN  Comparison: None  Findings: There is diffuse and marked low signal intensity throughout the liver on all of the pulse sequences.  There is also diffuse low signal intensity in the adrenal glands, spleen and bone marrow with evidence of diffuse gamna gandy bodies in the spleen. The pancreas and myocardium are spared.  These findings are consistent with secondary or transfusional hemochromatosis.  There are no MR findings to suggest cirrhosis or focal hepatic mass.  The portal and hepatic veins are patent.  The splenic vein  is patent. No splenomegaly.  No ascites or portal venous collaterals.  The pancreas is unremarkable.  The adrenal glands and kidneys are normal.  No mesenteric or retroperitoneal mass or adenopathy.  The aorta is normal in caliber.  The major branch vessels are normal.  The stomach, duodenum, visualized small bowel and visualized colon grossly normal.  The gallbladder is normal.  No common bile duct dilatation.  The pancreatic duct is normal.  IMPRESSION:  1.  MR findings consistent with secondary or transfusional hemochromatosis involving the liver, spleen, adrenal glands and bone marrow. 2.  No findings for cirrhosis, portal hypertension, splenomegaly or ascites. 3.  No worrisome enhancing liver lesions.  Original Report Authenticated By: P. Loralie Champagne, M.D.   Nm Gi Blood Loss  12/20/2011  *RADIOLOGY REPORT*  Clinical Data: Recurrent GI bleeding  NUCLEAR MEDICINE GASTROINTESTINAL BLEEDING STUDY  Technique:  Sequential abdominal images were obtained following intravenous administration of Tc-70m labeled red blood cells.  Radiopharmaceutical:  22 mCi of technetium 53m tagged red blood cells.  Comparison: None  Findings: Following the intravenous administration of the radiopharmaceutical  there is diffuse and intense radiotracer uptake by the stomach.  There is increased radiotracer uptake within the stomach and proximal small bowel loops. On the dynamic imaging there is antegrade flow of the radiotracer within the jejunum.   No abnormal focus of increased radiotracer uptake identified within the colon to suggest a lower GI bleed.  IMPRESSION:  1.  Increased radiotracer uptake within the proximal small bowel loops which may be compatible with acute proximal small bowel hemorrhage. Differential considerations include gastric ulcer or erosive gastritis.  Original Report Authenticated By: [name], M.D.    Scheduled Meds:   . acetaminophen  650 mg Oral Once  . diphenhydrAMINE  25 mg Oral Once  .  potassium chloride  10 mEq Intravenous Q1 Hr x 2  . sodium chloride  3 mL Intravenous Q12H  . traZODone  100 mg Oral QHS  . DISCONTD: pantoprazole (PROTONIX) IV  40 mg Intravenous Q12H   Continuous Infusions:   . sodium chloride 150 mL/hr (12/21/11 0913)  . DISCONTD: sodium chloride 75 mL/hr at 12/20/11 1519  . DISCONTD: sodium chloride      Principal Problem:  *BRBPR (bright red blood per rectum) Active Problems:  Essential hypertension, benign  GERD  Anemia, iron deficiency  Chronic hepatitis C without mention of hepatic coma  GI bleed  Leukocytosis  Hypokalemia  Rectal bleeding    Time spent:    Kela Millin  [facility] Pager [phone] If 8PM-8AM, please contact night-coverage at [email or web address], password [facility] 12/21/2011, 10:16 AM  LOS: 1 day

## 2011-12-21 NOTE — Progress Notes (Signed)
Agreed with previous nurse assessment. No new changes noted.    

## 2011-12-21 NOTE — H&P (View-Only) (Signed)
Patient ID: Luka D Aydelott, female   DOB: 12/09/1950, 61 y.o.   MRN: [id number]  Pt was admitted around 2300 after her EGD was complete. She has continued to have BRBPR in large amounts since then. At midnight her hgb was a little over 8. At 2300 her SBP was in the low 100s. Unfortunately the patient has been refusing all care including being cleaned, checking vital signs, physical assessment, and is now refusing blood transfusion and transfer to the step down unit. She claims that we are not helping her but is resistant to care. When discussing her need for a blood transfusion, she stated that she did not want to get HIV. I explained that this risk was extremely minimal and that she could die without it. She still refused and asked me to leave the room and turn the lights off so she could sleep. At 0130 I spoke with the patient's sister who states that something similar happened in the past in which the patient refused care and ended up having to have emergent amputation of her leg as a result. She did however just bury her boyfriend on 7/24, so we are not sure how much this is contributing to her current mental state. The niece is going to call the patient and try to get her to accept our care, particularly the blood products. If this is not successful will need to obtain a psych consult to confirm patient's competence.  

## 2011-12-21 NOTE — Progress Notes (Signed)
Refuses to be transferred to stepdown, stating "My boyfriend died there, I am not leaving this room".  Asked her to allow [place] to provide care for her here in order to attempt to avoid emergent transfer to stepdown, including blood transfusion, vitals monitoring, telemetry.  Pt agrees to monitoring.  Blood consent signed.

## 2011-12-21 NOTE — Care Management Note (Signed)
    Page 1 of 1   12/26/2011     12:37:17 PM   CARE MANAGEMENT NOTE 12/26/2011  Patient:  [name], [name]   Account Number:  [id number]  Date Initiated:  12/21/2011  Documentation initiated by:  Lanier Clam  Subjective/Objective Assessment:   ADMITTED W/BLOODY STOOLS.     Action/Plan:   FROM HOME ALONE.   Anticipated DC Date:  12/25/2011   Anticipated DC Plan:  HOME/SELF CARE      DC Planning Services  CM consult      Choice offered to / List presented to:             Status of service:  Completed, signed off Medicare Important Message given?   (If response is "NO", the following Medicare IM given date fields will be blank) Date Medicare IM given:   Date Additional Medicare IM given:    Discharge Disposition:  HOME/SELF CARE  Per UR Regulation:  Reviewed for med. necessity/level of care/duration of stay  If discussed at Long Length of Stay Meetings, dates discussed:    Comments:  12/24/11 [facility] RN,BSN  NCM [phone] GI FOLLOWING  12/21/11 Rubby Barbary RN,BSN NCM [phone]

## 2011-12-22 DIAGNOSIS — K625 Hemorrhage of anus and rectum: Secondary | ICD-10-CM | POA: Diagnosis not present

## 2011-12-22 DIAGNOSIS — K922 Gastrointestinal hemorrhage, unspecified: Secondary | ICD-10-CM | POA: Diagnosis not present

## 2011-12-22 DIAGNOSIS — D62 Acute posthemorrhagic anemia: Secondary | ICD-10-CM | POA: Diagnosis present

## 2011-12-22 DIAGNOSIS — I10 Essential (primary) hypertension: Secondary | ICD-10-CM | POA: Diagnosis not present

## 2011-12-22 DIAGNOSIS — K219 Gastro-esophageal reflux disease without esophagitis: Secondary | ICD-10-CM | POA: Diagnosis not present

## 2011-12-22 LAB — CBC
Platelets: 195 10*3/uL (ref 150–400)
RBC: 2.69 MIL/uL — ABNORMAL LOW (ref 3.87–5.11)
WBC: 8.4 10*3/uL (ref 4.0–10.5)

## 2011-12-22 LAB — BASIC METABOLIC PANEL
CO2: 21 mEq/L (ref 19–32)
Calcium: 7.3 mg/dL — ABNORMAL LOW (ref 8.4–10.5)
GFR calc non Af Amer: >90 mL/min (ref >90)
Sodium: 137 mEq/L (ref 135–145)

## 2011-12-22 MED ORDER — POTASSIUM CHLORIDE CRYS ER 20 MEQ PO TBCR
40.0000 meq | EXTENDED_RELEASE_TABLET | ORAL | Status: AC
  Administered 2011-12-22: 40 meq via ORAL
  Filled 2011-12-22 (×2): qty 2

## 2011-12-22 NOTE — Progress Notes (Signed)
Patient refusing care/assistance this evening, will not take medication and pulled telemetry off will not allow staff to put it back on. Dr. Suanne Marker notified, no new order given. Will continue to assess patient.

## 2011-12-22 NOTE — Progress Notes (Signed)
[name]  Subjective: **Patient still has minimal left-sided abdominal pain. She passed a small*stool today with minimal amounts of blood.  Objective:  Vital signs in last 24 hours: Temp:  [97.2 F (36.2 C)-99.5 F (37.5 C)] 98.5 F (36.9 C) (07/27 0529) Pulse Rate:  [71-94] 82  (07/27 0529) Resp:  [14-18] 14  (07/27 0529) BP: (108-148)/(70-98) 148/98 mmHg (07/27 0529) SpO2:  [95 %-100 %] 100 % (07/27 0529) Last BM Date: 12/21/11 General:   Alert,  Well-developed,  white female in NAD Heart:  Regular rate and rhythm; no murmurs Abdomen:  Soft,  and nondistended. Normal bowel sounds, without guarding, and without rebound; is to palpation in the left lower quadrant Extremities:  Without edema. Neurologic:  Alert and  oriented x4;  grossly normal neurologically. Psych:  Alert and cooperative. Normal mood and affect.  Intake/Output from previous day: 07/26 0701 - 07/27 0700 In: 2654 [P.O.:684; I.V.:1970] Out: -  Intake/Output this shift: Total I/O In: 150 [P.O.:150] Out: -   Lab Results:  Basename 12/22/11 0505 12/21/11 1345 12/20/11 2330 12/20/11 1130  WBC 8.4 11.4* -- 13.4*  HGB 8.3* 9.4* 8.3* --  HCT 23.7* 26.8* 24.3* --  PLT 195 221 -- 278   BMET  Basename 12/22/11 0505 12/21/11 1345 12/20/11 1130  NA 137 135 132*  K 3.3* 3.1* 3.4*  CL 107 103 97  CO2 21 25 24   GLUCOSE 140* 92 136*  BUN 6 6 8   CREATININE 0.61 0.61 0.53  CALCIUM 7.3* 7.4* 8.7   LFT  Basename 12/20/11 1130  PROT 7.1  ALBUMIN 3.2*  AST 104*  ALT 102*  ALKPHOS 88  BILITOT 0.6  BILIDIR --  IBILI --   PT/INR  Basename 12/20/11 1204  LABPROT 13.9  INR 1.05   Hepatitis Panel No results found for this basename: HEPBSAG,HCVAB,HEPAIGM,HEPBIGM in the last 72 hours  Studies/Results: Nm Gi Blood Loss  12/20/2011  *RADIOLOGY REPORT*  Clinical Data: Recurrent GI bleeding  NUCLEAR MEDICINE GASTROINTESTINAL BLEEDING STUDY  Technique:  Sequential abdominal images  were obtained following intravenous administration of Tc-38m labeled red blood cells.  Radiopharmaceutical:  22 mCi of technetium 63m tagged red blood cells.  Comparison: None  Findings: Following the intravenous administration of the radiopharmaceutical there is diffuse and intense radiotracer uptake by the stomach.  There is increased radiotracer uptake within the stomach and proximal small bowel loops. On the dynamic imaging there is antegrade flow of the radiotracer within the jejunum.   No abnormal focus of increased radiotracer uptake identified within the colon to suggest a lower GI bleed.  IMPRESSION:  1.  Increased radiotracer uptake within the proximal small bowel loops which may be compatible with acute proximal small bowel hemorrhage. Differential considerations include gastric ulcer or erosive gastritis.  Original Report Authenticated By: [name], M.D.     Assessment / Plan: **Acute lower GI bleed secondary to diverticulum. Clinically bleeding has stopped. Hemoglobin stable. Plan to advance diet and follow clinically.* Principal Problem:  *BRBPR (bright red blood per rectum) Active Problems:  Essential hypertension, benign  GERD  Anemia, iron deficiency  Chronic hepatitis C without mention of hepatic coma  GI bleed  Leukocytosis  Hypokalemia  Rectal bleeding  Anemia associated with acute blood loss     LOS: 2 days   [name]  12/22/2011, 12:38 PM

## 2011-12-22 NOTE — Progress Notes (Signed)
[facility] PROGRESS NOTE  [name] ZOX:[id number] DOB: 22-Aug-1950 DOA: 12/20/2011 PCP: Norberto Sorenson, MD  Assessment/Plan: Principal Problem:  *BRBPR (bright red blood per rectum) Active Problems:  Essential hypertension, benign  GERD  Anemia, iron deficiency  Chronic hepatitis C without mention of hepatic coma  GI bleed  Leukocytosis  Hypokalemia  Rectal bleeding  Anemia associated with acute blood loss  *Rectal bleeding  --Patient seen by GI in the ED and tagged red cell scan done as above reveals-increased radiotracer uptake within the proximal small bowel loops which may be compatible with acute proximal small bowel hemorrhage- gasstric ulcer versus erosive gastritis in differential per radiology  -s/p EGD last pm -no bleeding source -s/p colonoscopy this am Mild diverticulosis - Findings consistent with a left-sided diverticular bleed -pt finally agreed to transfusion and being as appropriate  -continue to monitor serial H&H's and transfuse as clinically appropriate. -Slowing down, GI following Active Problems:  Anemia, Acute blood loss -2/2 to above, slight drop in hemoglobin to 8.3 today (from 9.4 status post transfusion) continue to monitor and further adjust his as appropriate. -as above continue to follow and transfuse as needed Essential hypertension, benign  -Holding off antihypertensives secondary to above, monitor and treat accordingly  GERD  -PPI as above  Chronic hepatitis C without mention of hepatic coma  Leukocytosis  -Likely reactive -Resolved on no antibiotics l awaiting UA,  Hypokalemia  -Again Replace potassium.  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 [name] her last 3 EGDs have identified no bleeding source, also history of iron deficiency  anemia,GERD and presented with rectal bleeding. She reported that it worsened through the course of the day, had 5-6 episodes of rectal bleeding -large dark red/maroon clots(all blood with no stool mixed in) shortly after she arrived on the floor. She admitted to lower abd pain.. She declined her physical exam initially and also was refusing transfusions but beginning this am 7/26 - more cooperative.GI was consulted on admission and tagged redcell sca was done initially see results below, an EGD was done on admission- no bleeding and colonoscopy today 7/26 with Mild diverticulosis - Findings consistent with a left-sided diverticular bleed.   Consultants:  GI - Dr [name]  Procedures:  EGD FINDINGS: A small 2 cm hiatal hernia was identified. There was no  trace of blood in the upper GI tract. No evidence of any  ulcerations, erosions, vascular abnormalities, polyps, or masses.  Retroflexed views revealed no abnormalities. The scope was then  withdrawn from the patient and the procedure terminated.  COMPLICATIONS: None  IMPRESSION: 1) Hiatal hernia  RECOMMENDATIONS:  1) Follow HGB.  2) Transfuse as necessary  Colonoscopy - 7/26 ENDOSCOPIC IMPRESSION:  1) Mild diverticulosis - Findings consistent with a left-sided  diverticular bleed.  2) Small hemorrhoids  RECOMMENDATIONS:  1) Follow HGB.  2) Transfuse as necessary.   Antibiotics:  none  HPI/Subjective: States bleeding much less, still some lower abdominal discomfort-in much better mood today and open up about the recent loss of her fiance Objective: Filed Vitals:   12/21/11 1439 12/21/11 1535 12/21/11 2145 12/22/11 0529  BP: 108/70 112/70 123/72 148/98  Pulse: 71 90 94 82  Temp: 97.2 F (36.2 C) 98.5 F (36.9 C) 99.5 F (37.5 C) 98.5 F (36.9 C)  TempSrc: Oral Oral Oral Oral  Resp: 18 18 18 14   Height:      Weight:      SpO2: 96% 95% 96% 100%    Intake/Output Summary (Last 24 hours) at 12/22/11 1201 Last data filed  at 12/22/11 0950  Gross per 24 hour  Intake   2684 ml  Output      0 ml  Net   2684 ml    Exam:   General: alert, more cooperative today, in NAD  Cardiovascular: RRR  Respiratory: clear  Abdomen:soft, BS present,ND mild lower abd tenderness  EXT: no edema  Data Reviewed: Basic Metabolic Panel:  Lab 12/22/11 4098 12/21/11 1345 12/20/11 1130  NA 137 135 132*  K 3.3* 3.1* 3.4*  CL 107 103 97  CO2 21 25 24   GLUCOSE 140* 92 136*  BUN 6 6 8   CREATININE 0.61 0.61 0.53  CALCIUM 7.3* 7.4* 8.7  MG -- -- --  PHOS -- -- --   Liver Function Tests:  Lab 12/20/11 1130  AST 104*  ALT 102*  ALKPHOS 88  BILITOT 0.6  PROT 7.1  ALBUMIN 3.2*   No results found for this basename: LIPASE:5,AMYLASE:5 in the last 168 hours No results found for this basename: AMMONIA:5 in the last 168 hours CBC:  Lab 12/22/11 0505 12/21/11 1345 12/20/11 2330 12/20/11 1942 12/20/11 1130  WBC 8.4 11.4* -- -- 13.4*  NEUTROABS -- -- -- -- 10.1*  HGB 8.3* 9.4* 8.3* 9.3* 11.9*  HCT 23.7* 26.8* 24.3* 27.1* 34.7*  MCV 88.1 87.3 -- -- 91.3  PLT 195 221 -- -- 278   Cardiac Enzymes: No results found for this basename: CKTOTAL:5,CKMB:5,CKMBINDEX:5,TROPONINI:5 in the last 168 hours BNP (last 3 results) No results found for this basename: PROBNP:3 in the last 8760 hours CBG: No results found for this basename: GLUCAP:5 in the last 168 hours  No results found for this or any previous visit (from the past 240 hour(s)).   Studies: Mr Abdomen W Wo Contrast  11/28/2011  *RADIOLOGY REPORT*  Clinical Data: Chronic hepatitis C with elevated alpha-fetoprotein level.  Evaluate for possible hepatocellular carcinoma.  MRI ABDOMEN WITH AND WITHOUT CONTRAST  Technique:  Multiplanar multisequence MR imaging of the abdomen was performed both before and after administration of intravenous contrast.  Contrast: 17mL MULTIHANCE GADOBENATE DIMEGLUMINE 529 MG/ML IV SOLN  Comparison: None  Findings: There is diffuse and marked  low signal intensity throughout the liver on all of the pulse sequences.  There is also diffuse low signal intensity in the adrenal glands, spleen and bone marrow with evidence of diffuse gamna gandy bodies in the spleen. The pancreas and myocardium are spared.  These findings are consistent with secondary or transfusional hemochromatosis.  There are no MR findings to suggest cirrhosis or focal hepatic mass.  The portal and hepatic veins are patent.  The splenic vein is patent. No splenomegaly.  No ascites or portal venous collaterals.  The pancreas is unremarkable.  The adrenal glands and kidneys are normal.  No mesenteric or retroperitoneal mass or adenopathy.  The aorta is normal in caliber.  The major branch vessels are normal.  The stomach, duodenum, visualized small bowel and visualized colon grossly normal.  The gallbladder is normal.  No common bile duct dilatation.  The pancreatic duct is normal.  IMPRESSION:  1.  MR findings consistent with secondary or transfusional hemochromatosis involving the liver, spleen, adrenal glands and bone marrow. 2.  No findings for cirrhosis, portal hypertension, splenomegaly or ascites.  3.  No worrisome enhancing liver lesions.  Original Report Authenticated By: P. Loralie Champagne, M.D.   Nm Gi Blood Loss  12/20/2011  *RADIOLOGY REPORT*  Clinical Data: Recurrent GI bleeding  NUCLEAR MEDICINE GASTROINTESTINAL BLEEDING STUDY  Technique:  Sequential abdominal images were obtained following intravenous administration of Tc-81m labeled red blood cells.  Radiopharmaceutical:  22 mCi of technetium 44m tagged red blood cells.  Comparison: None  Findings: Following the intravenous administration of the radiopharmaceutical there is diffuse and intense radiotracer uptake by the stomach.  There is increased radiotracer uptake within the stomach and proximal small bowel loops. On the dynamic imaging there is antegrade flow of the radiotracer within the jejunum.   No abnormal focus of  increased radiotracer uptake identified within the colon to suggest a lower GI bleed.  IMPRESSION:  1.  Increased radiotracer uptake within the proximal small bowel loops which may be compatible with acute proximal small bowel hemorrhage. Differential considerations include gastric ulcer or erosive gastritis.  Original Report Authenticated By: [name], M.D.    Scheduled Meds:    . acetaminophen  650 mg Oral Once  . gabapentin  300 mg Oral TID  . pantoprazole (PROTONIX) IV  40 mg Intravenous Q12H  . potassium chloride  40 mEq Oral Q4H  . sodium chloride  3 mL Intravenous Q12H  . DISCONTD: traZODone  100 mg Oral QHS   Continuous Infusions:    . sodium chloride 150 mL/hr at 12/21/11 2132    Principal Problem:  *BRBPR (bright red blood per rectum) Active Problems:  Essential hypertension, benign  GERD  Anemia, iron deficiency  Chronic hepatitis C without mention of hepatic coma  GI bleed  Leukocytosis  Hypokalemia  Rectal bleeding  Anemia associated with acute blood loss    Time spent:    Kela Millin  [facility] Pager [phone] If 8PM-8AM, please contact night-coverage at [email or web address], password [facility] 12/22/2011, 12:01 PM  LOS: 2 days

## 2011-12-23 ENCOUNTER — Inpatient Hospital Stay (HOSPITAL_COMMUNITY): Payer: Medicare Other

## 2011-12-23 DIAGNOSIS — I10 Essential (primary) hypertension: Secondary | ICD-10-CM | POA: Diagnosis not present

## 2011-12-23 DIAGNOSIS — K625 Hemorrhage of anus and rectum: Secondary | ICD-10-CM | POA: Diagnosis not present

## 2011-12-23 DIAGNOSIS — K5730 Diverticulosis of large intestine without perforation or abscess without bleeding: Secondary | ICD-10-CM | POA: Diagnosis not present

## 2011-12-23 DIAGNOSIS — R1032 Left lower quadrant pain: Secondary | ICD-10-CM

## 2011-12-23 DIAGNOSIS — D62 Acute posthemorrhagic anemia: Secondary | ICD-10-CM | POA: Diagnosis not present

## 2011-12-23 DIAGNOSIS — K219 Gastro-esophageal reflux disease without esophagitis: Secondary | ICD-10-CM | POA: Diagnosis not present

## 2011-12-23 LAB — BASIC METABOLIC PANEL
BUN: 3 mg/dL — ABNORMAL LOW (ref 6–23)
Chloride: 106 mEq/L (ref 96–112)
Glucose, Bld: 150 mg/dL — ABNORMAL HIGH (ref 70–99)
Potassium: 4.4 mEq/L (ref 3.5–5.1)

## 2011-12-23 LAB — CBC
HCT: 25.1 % — ABNORMAL LOW (ref 36.0–46.0)
Hemoglobin: 8.6 g/dL — ABNORMAL LOW (ref 12.0–15.0)
RBC: 2.82 MIL/uL — ABNORMAL LOW (ref 3.87–5.11)
WBC: 7.1 10*3/uL (ref 4.0–10.5)

## 2011-12-23 MED ORDER — GABAPENTIN 300 MG PO CAPS
600.0000 mg | ORAL_CAPSULE | Freq: Three times a day (TID) | ORAL | Status: DC
  Administered 2011-12-23 – 2011-12-25 (×7): 600 mg via ORAL
  Filled 2011-12-23 (×8): qty 2

## 2011-12-23 MED ORDER — IOHEXOL 300 MG/ML  SOLN
100.0000 mL | Freq: Once | INTRAMUSCULAR | Status: AC | PRN
  Administered 2011-12-23: 100 mL via INTRAVENOUS

## 2011-12-23 NOTE — Progress Notes (Signed)
[facility] PROGRESS NOTE  [name] JXB:[id number] DOB: 06-13-1950 DOA: 12/20/2011 PCP: Norberto Sorenson, MD  Assessment/Plan: Principal Problem:  *BRBPR (bright red blood per rectum) Active Problems:  Essential hypertension, benign  GERD  Anemia, iron deficiency  Chronic hepatitis C without mention of hepatic coma  GI bleed  Leukocytosis  Hypokalemia  Rectal bleeding  Anemia associated with acute blood loss  *Rectal bleeding  --Patient seen by GI in the ED and tagged red cell scan done as above reveals-increased radiotracer uptake within the proximal small bowel loops which may be compatible with acute proximal small bowel hemorrhage- gastric ulcer versus erosive gastritis in differential per radiology  -s/p EGD last pm -no bleeding source -s/p colonoscopy this am Mild diverticulosis - Findings consistent with a left-sided diverticular bleed -pt finally agreed to transfusion and being transfused as appropriate  -H&H stable today,follow and transfuse as clinically appropriate. -resolving diverticular bleed, GI following Active Problems:  Anemia, Acute blood loss -2/2 to above,  8.3 7/27 (from 9.4 status post transfusion), hgb stable today, continue to monitor and further adjust his as appropriate. Essential hypertension, benign  -Holding off antihypertensives secondary to above, monitor and treat accordingly  GERD  -PPI as above  Chronic hepatitis C without mention of hepatic coma  Leukocytosis  -Likely reactive -Resolved on no antibiotics l awaiting UA,  Hypokalemia  -resolved  Code Status: FULL   Brief narrative: HPI:  The patient is a 61 year old black female with past medical history significant for recurrent GI bleeding found to have bleeding AVMs in the second portion of the duodenum in the past, but per Dr. [name] her last 3 EGDs have identified no bleeding source, also history of iron deficiency anemia,GERD and presented with rectal bleeding. She reported that it  worsened through the course of the day, had 5-6 episodes of rectal bleeding -large dark red/maroon clots(all blood with no stool mixed in) shortly after she arrived on the floor. She admitted to lower abd pain.. She declined her physical exam initially and also was refusing transfusions but beginning this am 7/26 - more cooperative.GI was consulted on admission and tagged redcell sca was done initially see results below, an EGD was done on admission- no bleeding and colonoscopy today 7/26 with Mild diverticulosis - Findings consistent with a left-sided diverticular bleed.   Consultants:  GI - Dr [name]  Procedures:  EGD FINDINGS: A small 2 cm hiatal hernia was identified. There was no  trace of blood in the upper GI tract. No evidence of any  ulcerations, erosions, vascular abnormalities, polyps, or masses.  Retroflexed views revealed no abnormalities. The scope was then  withdrawn from the patient and the procedure terminated.  COMPLICATIONS: None  IMPRESSION: 1) Hiatal hernia  RECOMMENDATIONS:  1) Follow HGB.  2) Transfuse as necessary  Colonoscopy - 7/26 ENDOSCOPIC IMPRESSION:  1) Mild diverticulosis - Findings consistent with a left-sided  diverticular bleed.  2) Small hemorrhoids  RECOMMENDATIONS:  1) Follow HGB.  2) Transfuse as necessary.   Antibiotics:  none  HPI/Subjective: No further bleeding today, states she had small BM last pm with small amt of blood, still some lower abd pain. Objective: Filed Vitals:   12/22/11 1627 12/22/11 2116 12/22/11 2329 12/23/11 0551  BP: 146/80 131/71 117/79 160/98  Pulse: 79 82 82 78  Temp: 98.6 F (37 C) 97.8 F (36.6 C) 99.7 F (37.6 C) 98.6 F (37 C)  TempSrc: Oral Oral Oral Oral  Resp: 16 18 20 20   Height:  Weight:      SpO2: 96% 98% 98% 98%    Intake/Output Summary (Last 24 hours) at 12/23/11 1201 Last data filed at 12/23/11 0900  Gross per 24 hour  Intake   2885 ml  Output   1750 ml  Net   1135 ml     Exam:   General: alert, more cooperative today, in NAD  Cardiovascular: RRR  Respiratory: clear  Abdomen:soft, BS present,ND mild lower abd tenderness  EXT: no edema  Data Reviewed: Basic Metabolic Panel:  Lab 12/23/11 1610 12/22/11 0505 12/21/11 1345 12/20/11 1130  NA 136 137 135 132*  K 4.4 3.3* 3.1* 3.4*  CL 106 107 103 97  CO2 22 21 25 24   GLUCOSE 150* 140* 92 136*  BUN 3* 6 6 8   CREATININE 0.58 0.61 0.61 0.53  CALCIUM 7.8* 7.3* 7.4* 8.7  MG -- -- -- --  PHOS -- -- -- --   Liver Function Tests:  Lab 12/20/11 1130  AST 104*  ALT 102*  ALKPHOS 88  BILITOT 0.6  PROT 7.1  ALBUMIN 3.2*   No results found for this basename: LIPASE:5,AMYLASE:5 in the last 168 hours No results found for this basename: AMMONIA:5 in the last 168 hours CBC:  Lab 12/23/11 0516 12/22/11 0505 12/21/11 1345 12/20/11 2330 12/20/11 1942 12/20/11 1130  WBC 7.1 8.4 11.4* -- -- 13.4*  NEUTROABS -- -- -- -- -- 10.1*  HGB 8.6* 8.3* 9.4* 8.3* 9.3* --  HCT 25.1* 23.7* 26.8* 24.3* 27.1* --  MCV 89.0 88.1 87.3 -- -- 91.3  PLT 210 195 221 -- -- 278   Cardiac Enzymes: No results found for this basename: CKTOTAL:5,CKMB:5,CKMBINDEX:5,TROPONINI:5 in the last 168 hours BNP (last 3 results) No results found for this basename: PROBNP:3 in the last 8760 hours CBG: No results found for this basename: GLUCAP:5 in the last 168 hours  No results found for this or any previous visit (from the past 240 hour(s)).   Studies: Mr Abdomen W Wo Contrast  11/28/2011  *RADIOLOGY REPORT*  Clinical Data: Chronic hepatitis C with elevated alpha-fetoprotein level.  Evaluate for possible hepatocellular carcinoma.  MRI ABDOMEN WITH AND WITHOUT CONTRAST  Technique:  Multiplanar multisequence MR imaging of the abdomen was performed both before and after administration of intravenous contrast.  Contrast: 17mL MULTIHANCE GADOBENATE DIMEGLUMINE 529 MG/ML IV SOLN  Comparison: None  Findings: There is diffuse and marked low  signal intensity throughout the liver on all of the pulse sequences.  There is also diffuse low signal intensity in the adrenal glands, spleen and bone marrow with evidence of diffuse gamna gandy bodies in the spleen. The pancreas and myocardium are spared.  These findings are consistent with secondary or transfusional hemochromatosis.  There are no MR findings to suggest cirrhosis or focal hepatic mass.  The portal and hepatic veins are patent.  The splenic vein is patent. No splenomegaly.  No ascites or portal venous collaterals.  The pancreas is unremarkable.  The adrenal glands and kidneys are normal.  No mesenteric or retroperitoneal mass or adenopathy.  The aorta is normal in caliber.  The major branch vessels are normal.  The stomach, duodenum, visualized small bowel and visualized colon grossly normal.  The gallbladder is normal.  No common bile duct dilatation.  The pancreatic duct is normal.  IMPRESSION:  1.  MR findings consistent with secondary or transfusional hemochromatosis involving the liver, spleen, adrenal glands and bone marrow. 2.  No findings for cirrhosis, portal hypertension, splenomegaly or ascites. 3.  No worrisome enhancing liver lesions.  Original Report Authenticated By: P. Loralie Champagne, M.D.   Nm Gi Blood Loss  12/20/2011  *RADIOLOGY REPORT*  Clinical Data: Recurrent GI bleeding  NUCLEAR MEDICINE GASTROINTESTINAL BLEEDING STUDY  Technique:  Sequential abdominal images were obtained following intravenous administration of Tc-47m labeled red blood cells.  Radiopharmaceutical:  22 mCi of technetium 65m tagged red blood cells.  Comparison: None  Findings: Following the intravenous administration of the radiopharmaceutical there is diffuse and intense radiotracer uptake by the stomach.  There is increased radiotracer uptake within the stomach and proximal small bowel loops. On the dynamic imaging there is antegrade flow of the radiotracer within the jejunum.   No abnormal focus of  increased radiotracer uptake identified within the colon to suggest a lower GI bleed.  IMPRESSION:  1.  Increased radiotracer uptake within the proximal small bowel loops which may be compatible with acute proximal small bowel hemorrhage. Differential considerations include gastric ulcer or erosive gastritis.  Original Report Authenticated By: [name], M.D.    Scheduled Meds:    . acetaminophen  650 mg Oral Once  . gabapentin  600 mg Oral TID  . pantoprazole (PROTONIX) IV  40 mg Intravenous Q12H  . potassium chloride  40 mEq Oral Q4H  . sodium chloride  3 mL Intravenous Q12H  . DISCONTD: gabapentin  300 mg Oral TID   Continuous Infusions:    . sodium chloride 75 mL/hr at 12/22/11 2300    Principal Problem:  *BRBPR (bright red blood per rectum) Active Problems:  Essential hypertension, benign  GERD  Anemia, iron deficiency  Chronic hepatitis C without mention of hepatic coma  GI bleed  Leukocytosis  Hypokalemia  Rectal bleeding  Anemia associated with acute blood loss    Time spent:    Kela Millin  [facility] Pager [phone] If 8PM-8AM, please contact night-coverage at [email or web address], password [facility] 12/23/2011, 12:01 PM  LOS: 3 days

## 2011-12-23 NOTE — Progress Notes (Signed)
[facility] Progress Note  Subjective: **Patient still has minimal left-sided abdominal pain. She passed a small*stool today with minimal amounts of blood.  Objective:  Vital signs in last 24 hours: Temp:  [97.8 F (36.6 C)-99.7 F (37.6 C)] 98.6 F (37 C) (07/28 0551) Pulse Rate:  [78-82] 78  (07/28 0551) Resp:  [16-20] 20  (07/28 0551) BP: (117-160)/(71-98) 160/98 mmHg (07/28 0551) SpO2:  [96 %-98 %] 98 % (07/28 0551) Last BM Date: 12/22/11 General:   Alert,  Well-developed,  white female in NAD Heart:  Regular rate and rhythm; no murmurs Abdomen:  Soft,  and nondistended. Normal bowel sounds, without guarding, and without rebound; is to palpation in the left lower quadrant Extremities:  Without edema. Neurologic:  Alert and  oriented x4;  grossly normal neurologically. Psych:  Alert and cooperative. Normal mood and affect.  Intake/Output from previous day: 07/27 0701 - 07/28 0700 In: 2945 [P.O.:270; I.V.:2655; IV Piggyback:20] Out: 1550 [Urine:1550] Intake/Output this shift: Total I/O In: 250 [P.O.:250] Out: 200 [Urine:200]  Lab Results:  Basename 12/23/11 0516 12/22/11 0505 12/21/11 1345  WBC 7.1 8.4 11.4*  HGB 8.6* 8.3* 9.4*  HCT 25.1* 23.7* 26.8*  PLT 210 195 221   BMET  Basename 12/23/11 0516 12/22/11 0505 12/21/11 1345  NA 136 137 135  K 4.4 3.3* 3.1*  CL 106 107 103  CO2 22 21 25   GLUCOSE 150* 140* 92  BUN 3* 6 6  CREATININE 0.58 0.61 0.61  CALCIUM 7.8* 7.3* 7.4*   LFT No results found for this basename: PROT,ALBUMIN,AST,ALT,ALKPHOS,BILITOT,BILIDIR,IBILI in the last 72 hours PT/INR No results found for this basename: LABPROT:2,INR:2 in the last 72 hours Hepatitis Panel No results found for this basename: HEPBSAG,HCVAB,HEPAIGM,HEPBIGM in the last 72 hours  Studies/Results: No results found.   Assessment / Plan: **Acute lower GI bleed secondary to diverticulum. Clinically bleeding has stopped. Hemoglobin stable. Plan to advance diet  and follow clinically.* Principal Problem:  *BRBPR (bright red blood per rectum) Active Problems:  Essential hypertension, benign  GERD  Anemia, iron deficiency  Chronic hepatitis C without mention of hepatic coma  GI bleed  Leukocytosis  Hypokalemia  Rectal bleeding  Anemia associated with acute blood loss     LOS: 3 days   Melvia Heaps  12/23/2011, 12:22 PM

## 2011-12-23 NOTE — Progress Notes (Signed)
[facility] Progress Note  Subjective: *She continues to complain of moderate pain in the left lower quadrant and now is radiating to the right lower quadrant. She has one small stool which contain a minimal amount of blood.**  Objective:  Vital signs in last 24 hours: Temp:  [97.8 F (36.6 C)-99.7 F (37.6 C)] 98.6 F (37 C) (07/28 0551) Pulse Rate:  [78-82] 78  (07/28 0551) Resp:  [16-20] 20  (07/28 0551) BP: (117-160)/(71-98) 160/98 mmHg (07/28 0551) SpO2:  [96 %-98 %] 98 % (07/28 0551) Last BM Date: 12/22/11 General:   Alert,  Well-developed,  white female in NAD Heart:  Regular rate and rhythm; no murmurs Abdomen:  Soft,and nondistended. Normal bowel sounds, without guarding, and without rebound. There is mild tenderness to palpation the left and right lower quadrants Extremities:  Without edema. Neurologic:  Alert and  oriented x4;  grossly normal neurologically. Psych:  Alert and cooperative. Normal mood and affect.  Intake/Output from previous day: 07/27 0701 - 07/28 0700 In: 2945 [P.O.:270; I.V.:2655; IV Piggyback:20] Out: 1550 [Urine:1550] Intake/Output this shift: Total I/O In: 250 [P.O.:250] Out: 200 [Urine:200]  Lab Results:  Basename 12/23/11 0516 12/22/11 0505 12/21/11 1345  WBC 7.1 8.4 11.4*  HGB 8.6* 8.3* 9.4*  HCT 25.1* 23.7* 26.8*  PLT 210 195 221   BMET  Basename 12/23/11 0516 12/22/11 0505 12/21/11 1345  NA 136 137 135  K 4.4 3.3* 3.1*  CL 106 107 103  CO2 22 21 25   GLUCOSE 150* 140* 92  BUN 3* 6 6  CREATININE 0.58 0.61 0.61  CALCIUM 7.8* 7.3* 7.4*   LFT No results found for this basename: PROT,ALBUMIN,AST,ALT,ALKPHOS,BILITOT,BILIDIR,IBILI in the last 72 hours PT/INR No results found for this basename: LABPROT:2,INR:2 in the last 72 hours Hepatitis Panel No results found for this basename: HEPBSAG,HCVAB,HEPAIGM,HEPBIGM in the last 72 hours  Studies/Results: No results found.   Assessment / Plan: *#1 acute GI bleed as  clinically this has resolved. Likely secondary to diverticular bleed #2 abdominal pain. With initial leukocytosis and now pain low-grade diverticulitis should be ruled out.  Recommendations #1 CT of the abdomen and pelvis** Principal Problem:  *BRBPR (bright red blood per rectum) Active Problems:  Essential hypertension, benign  GERD  Anemia, iron deficiency  Chronic hepatitis C without mention of hepatic coma  GI bleed  Leukocytosis  Hypokalemia  Rectal bleeding  Anemia associated with acute blood loss     LOS: 3 days   Melvia Heaps  12/23/2011, 12:29 PM

## 2011-12-24 ENCOUNTER — Encounter (HOSPITAL_COMMUNITY): Payer: Self-pay | Admitting: Gastroenterology

## 2011-12-24 DIAGNOSIS — K5732 Diverticulitis of large intestine without perforation or abscess without bleeding: Secondary | ICD-10-CM

## 2011-12-24 DIAGNOSIS — K625 Hemorrhage of anus and rectum: Secondary | ICD-10-CM | POA: Diagnosis not present

## 2011-12-24 DIAGNOSIS — D62 Acute posthemorrhagic anemia: Secondary | ICD-10-CM | POA: Diagnosis not present

## 2011-12-24 DIAGNOSIS — I10 Essential (primary) hypertension: Secondary | ICD-10-CM | POA: Diagnosis not present

## 2011-12-24 LAB — TYPE AND SCREEN
ABO/RH(D): AB POS
Antibody Screen: NEGATIVE
Unit division: 0
Unit division: 0

## 2011-12-24 LAB — CBC
HCT: 26.3 % — ABNORMAL LOW (ref 36.0–46.0)
Hemoglobin: 9.1 g/dL — ABNORMAL LOW (ref 12.0–15.0)
MCH: 30.4 pg (ref 26.0–34.0)
MCHC: 34.6 g/dL (ref 30.0–36.0)

## 2011-12-24 MED ORDER — METRONIDAZOLE IN NACL 5-0.79 MG/ML-% IV SOLN
500.0000 mg | Freq: Three times a day (TID) | INTRAVENOUS | Status: DC
  Administered 2011-12-24 – 2011-12-25 (×4): 500 mg via INTRAVENOUS
  Filled 2011-12-24 (×5): qty 100

## 2011-12-24 MED ORDER — CIPROFLOXACIN IN D5W 400 MG/200ML IV SOLN
400.0000 mg | Freq: Two times a day (BID) | INTRAVENOUS | Status: DC
  Administered 2011-12-24: 400 mg via INTRAVENOUS
  Filled 2011-12-24 (×4): qty 200

## 2011-12-24 NOTE — Progress Notes (Signed)
[facility] PROGRESS NOTE  [name]:[id number] DOB: Oct 04, 1950 DOA: 12/20/2011 PCP: Norberto Sorenson, MD  Assessment/Plan: Principal Problem:  *BRBPR (bright red blood per rectum) Active Problems:  Essential hypertension, benign  GERD  Anemia, iron deficiency  Chronic hepatitis C without mention of hepatic coma  GI bleed  Leukocytosis  Hypokalemia  Rectal bleeding  Anemia associated with acute blood loss  Abdominal pain, left lower quadrant  *Rectal bleeding secondary to diverticulosis --Patient seen by GI in the ED and tagged red cell scan done as above reveals-increased radiotracer uptake within the proximal small bowel loops which may be compatible with acute proximal small bowel hemorrhage- gastric ulcer versus erosive gastritis in differential per radiology  -s/p EGD last pm -no bleeding source -s/p colonoscopy this am Mild diverticulosis - Findings consistent with a left-sided diverticular bleed -pt finally agreed to transfusion and being transfused as appropriate  -H&H stable today,follow and transfuse as clinically appropriate. -Patient with a little more bleeding this a.m., but hgb stable - GI following Active Problems:  Diverticulitis -CT. scan of abdomen and pelvis in today with prominent sized colonic diverticulum distal left colon in the left lower abdomen with mild thickening of colonic wall-findings consistent with diverticulitis of short segment colitis. -Start On  Flagyl and Cipro, follow Anemia, Acute blood loss -2/2 to above,  8.3 7/27 (from 9.4 status post transfusion), hgb stable today, continue to monitor and further adjust his as appropriate. Essential hypertension, benign  -Holding off antihypertensives secondary to above, monitor and treat accordingly  GERD  -PPI as above  Chronic hepatitis C without mention of hepatic coma  Leukocytosis  -Likely reactive -Resolved   Hypokalemia  -resolved  Code Status: FULL   Brief narrative: HPI:  The  patient is a 61 year old black female with past medical history significant for recurrent GI bleeding found to have bleeding AVMs in the second portion of the duodenum in the past, but per Dr. [name] her last 3 EGDs have identified no bleeding source, also history of iron deficiency anemia,GERD and presented with rectal bleeding. She reported that it worsened through the course of the day, had 5-6 episodes of rectal bleeding -large dark red/maroon clots(all blood with no stool mixed in) shortly after she arrived on the floor. She admitted to lower abd pain.. She declined her physical exam initially and also was refusing transfusions but beginning this am 7/26 - more cooperative.GI was consulted on admission and tagged redcell sca was done initially see results below, an EGD was done on admission- no bleeding and colonoscopy today 7/26 with Mild diverticulosis - Findings consistent with a left-sided diverticular bleed.   Consultants:  GI - Dr [name]  Procedures:  EGD FINDINGS: A small 2 cm hiatal hernia was identified. There was no  trace of blood in the upper GI tract. No evidence of any  ulcerations, erosions, vascular abnormalities, polyps, or masses.  Retroflexed views revealed no abnormalities. The scope was then  withdrawn from the patient and the procedure terminated.  COMPLICATIONS: None  IMPRESSION: 1) Hiatal hernia  RECOMMENDATIONS:  1) Follow HGB.  2) Transfuse as necessary  Colonoscopy - 7/26 ENDOSCOPIC IMPRESSION:  1) Mild diverticulosis - Findings consistent with a left-sided  diverticular bleed.  2) Small hemorrhoids  RECOMMENDATIONS:  1) Follow HGB.  2) Transfuse as necessary.   Antibiotics:  none  HPI/Subjective: Diarrheal stools x2 this a.m. with some blood clots. Still with left lower abdominal pain. Objective: Filed Vitals:   12/23/11 1500 12/23/11 2059 12/23/11  2130 12/24/11 0600  BP: 148/78 160/96 154/72 152/76  Pulse:  80  82  Temp:  97.8 F (36.6 C)  98.8  F (37.1 C)  TempSrc:  Oral  Oral  Resp:  20  18  Height:      Weight:      SpO2:  97%  99%    Intake/Output Summary (Last 24 hours) at 12/24/11 1050 Last data filed at 12/24/11 0900  Gross per 24 hour  Intake   1150 ml  Output   2101 ml  Net   -951 ml    Exam:   General: alert, more cooperative today, in NAD  Cardiovascular: RRR  Respiratory: clear  Abdomen:soft, BS present,ND mild lower abd tenderness  EXT: no edema  Data Reviewed: Basic Metabolic Panel:  Lab 12/23/11 0981 12/22/11 0505 12/21/11 1345 12/20/11 1130  NA 136 137 135 132*  K 4.4 3.3* 3.1* 3.4*  CL 106 107 103 97  CO2 22 21 25 24   GLUCOSE 150* 140* 92 136*  BUN 3* 6 6 8   CREATININE 0.58 0.61 0.61 0.53  CALCIUM 7.8* 7.3* 7.4* 8.7  MG -- -- -- --  PHOS -- -- -- --   Liver Function Tests:  Lab 12/20/11 1130  AST 104*  ALT 102*  ALKPHOS 88  BILITOT 0.6  PROT 7.1  ALBUMIN 3.2*   No results found for this basename: LIPASE:5,AMYLASE:5 in the last 168 hours No results found for this basename: AMMONIA:5 in the last 168 hours CBC:  Lab 12/24/11 0408 12/23/11 0516 12/22/11 0505 12/21/11 1345 12/20/11 2330 12/20/11 1130  WBC 8.0 7.1 8.4 11.4* -- 13.4*  NEUTROABS -- -- -- -- -- 10.1*  HGB 9.1* 8.6* 8.3* 9.4* 8.3* --  HCT 26.3* 25.1* 23.7* 26.8* 24.3* --  MCV 88.0 89.0 88.1 87.3 -- 91.3  PLT 280 210 195 221 -- 278   Cardiac Enzymes: No results found for this basename: CKTOTAL:5,CKMB:5,CKMBINDEX:5,TROPONINI:5 in the last 168 hours BNP (last 3 results) No results found for this basename: PROBNP:3 in the last 8760 hours CBG: No results found for this basename: GLUCAP:5 in the last 168 hours  No results found for this or any previous visit (from the past 240 hour(s)).   Studies: Mr Abdomen W Wo Contrast  11/28/2011  *RADIOLOGY REPORT*  Clinical Data: Chronic hepatitis C with elevated alpha-fetoprotein level.  Evaluate for possible hepatocellular carcinoma.  MRI ABDOMEN WITH AND WITHOUT CONTRAST   Technique:  Multiplanar multisequence MR imaging of the abdomen was performed both before and after administration of intravenous contrast.  Contrast: 17mL MULTIHANCE GADOBENATE DIMEGLUMINE 529 MG/ML IV SOLN  Comparison: None  Findings: There is diffuse and marked low signal intensity throughout the liver on all of the pulse sequences.  There is also diffuse low signal intensity in the adrenal glands, spleen and bone marrow with evidence of diffuse gamna gandy bodies in the spleen. The pancreas and myocardium are spared.  These findings are consistent with secondary or transfusional hemochromatosis.  There are no MR findings to suggest cirrhosis or focal hepatic mass.  The portal and hepatic veins are patent.  The splenic vein is patent. No splenomegaly.  No ascites or portal venous collaterals.  The pancreas is unremarkable.  The adrenal glands and kidneys are normal.  No mesenteric or retroperitoneal mass or adenopathy.  The aorta is normal in caliber.  The major branch vessels are normal.  The stomach, duodenum, visualized small bowel and visualized colon grossly normal.  The gallbladder is normal.  No common bile duct dilatation.  The pancreatic duct is normal.  IMPRESSION:  1.  MR findings consistent with secondary or transfusional hemochromatosis involving the liver, spleen, adrenal glands and bone marrow. 2.  No findings for cirrhosis, portal hypertension, splenomegaly or ascites. 3.  No worrisome enhancing liver lesions.  Original Report Authenticated By: P. Loralie Champagne, M.D.   Nm Gi Blood Loss  12/20/2011  *RADIOLOGY REPORT*  Clinical Data: Recurrent GI bleeding  NUCLEAR MEDICINE GASTROINTESTINAL BLEEDING STUDY  Technique:  Sequential abdominal images were obtained following intravenous administration of Tc-54m labeled red blood cells.  Radiopharmaceutical:  22 mCi of technetium 42m tagged red blood cells.  Comparison: None  Findings: Following the intravenous administration of the  radiopharmaceutical there is diffuse and intense radiotracer uptake by the stomach.  There is increased radiotracer uptake within the stomach and proximal small bowel loops. On the dynamic imaging there is antegrade flow of the radiotracer within the jejunum.   No abnormal focus of increased radiotracer uptake identified within the colon to suggest a lower GI bleed.  IMPRESSION:  1.  Increased radiotracer uptake within the proximal small bowel loops which may be compatible with acute proximal small bowel hemorrhage. Differential considerations include gastric ulcer or erosive gastritis.  Original Report Authenticated By: [name], M.D.    Scheduled Meds:    . acetaminophen  650 mg Oral Once  . ciprofloxacin  400 mg Intravenous Q12H  . gabapentin  600 mg Oral TID  . metronidazole  500 mg Intravenous Q8H  . pantoprazole (PROTONIX) IV  40 mg Intravenous Q12H  . sodium chloride  3 mL Intravenous Q12H  . DISCONTD: gabapentin  300 mg Oral TID   Continuous Infusions:    . sodium chloride 75 mL/hr at 12/22/11 2300    Principal Problem:  *BRBPR (bright red blood per rectum) Active Problems:  Essential hypertension, benign  GERD  Anemia, iron deficiency  Chronic hepatitis C without mention of hepatic coma  GI bleed  Leukocytosis  Hypokalemia  Rectal bleeding  Anemia associated with acute blood loss  Abdominal pain, left lower quadrant    Time spent:    Kela Millin  [facility] Pager [phone] If 8PM-8AM, please contact night-coverage at [email or web address], password [facility] 12/24/2011, 10:50 AM  LOS: 4 days

## 2011-12-24 NOTE — Progress Notes (Signed)
After Cipro infused pt complaining of severe itching, Dr Suanne Marker notified via text.

## 2011-12-25 DIAGNOSIS — K5792 Diverticulitis of intestine, part unspecified, without perforation or abscess without bleeding: Secondary | ICD-10-CM | POA: Clinically undetermined

## 2011-12-25 DIAGNOSIS — D62 Acute posthemorrhagic anemia: Secondary | ICD-10-CM | POA: Diagnosis not present

## 2011-12-25 DIAGNOSIS — K625 Hemorrhage of anus and rectum: Secondary | ICD-10-CM | POA: Diagnosis not present

## 2011-12-25 DIAGNOSIS — K5732 Diverticulitis of large intestine without perforation or abscess without bleeding: Secondary | ICD-10-CM | POA: Diagnosis not present

## 2011-12-25 DIAGNOSIS — I10 Essential (primary) hypertension: Secondary | ICD-10-CM | POA: Diagnosis not present

## 2011-12-25 LAB — URINALYSIS, ROUTINE W REFLEX MICROSCOPIC
Bilirubin Urine: NEGATIVE
Hgb urine dipstick: NEGATIVE
Ketones, ur: NEGATIVE mg/dL
Specific Gravity, Urine: 1.021 (ref 1.005–1.030)
Urobilinogen, UA: 1 mg/dL (ref 0.0–1.0)
pH: 6 (ref 5.0–8.0)

## 2011-12-25 LAB — CBC
HCT: 25.1 % — ABNORMAL LOW (ref 36.0–46.0)
MCH: 31 pg (ref 26.0–34.0)
MCV: 89.3 fL (ref 78.0–100.0)
Platelets: 269 10*3/uL (ref 150–400)
RBC: 2.81 MIL/uL — ABNORMAL LOW (ref 3.87–5.11)
RDW: 15.6 % — ABNORMAL HIGH (ref 11.5–15.5)

## 2011-12-25 LAB — BASIC METABOLIC PANEL
BUN: 4 mg/dL — ABNORMAL LOW (ref 6–23)
CO2: 25 mEq/L (ref 19–32)
Calcium: 7.8 mg/dL — ABNORMAL LOW (ref 8.4–10.5)
Creatinine, Ser: 0.63 mg/dL (ref 0.50–1.10)

## 2011-12-25 LAB — URINE MICROSCOPIC-ADD ON

## 2011-12-25 MED ORDER — ATENOLOL 50 MG PO TABS
50.0000 mg | ORAL_TABLET | Freq: Every day | ORAL | Status: DC
  Administered 2011-12-25: 50 mg via ORAL
  Filled 2011-12-25: qty 1

## 2011-12-25 MED ORDER — FLUOXETINE HCL 20 MG PO CAPS: 20.0000 mg | ORAL_CAPSULE | Freq: Every day | ORAL | Status: DC

## 2011-12-25 MED ORDER — LISINOPRIL 20 MG PO TABS
40.0000 mg | ORAL_TABLET | Freq: Every day | ORAL | Status: DC
  Administered 2011-12-25: 40 mg via ORAL
  Filled 2011-12-25: qty 2

## 2011-12-25 MED ORDER — ASPIRIN EC 81 MG PO TBEC: 162.0000 mg | DELAYED_RELEASE_TABLET | Freq: Two times a day (BID) | ORAL | Status: DC

## 2011-12-25 MED ORDER — TRAMADOL HCL 50 MG PO TABS
25.0000 mg | ORAL_TABLET | Freq: Three times a day (TID) | ORAL | Status: DC | PRN
  Administered 2011-12-25: 50 mg via ORAL
  Filled 2011-12-25: qty 1

## 2011-12-25 MED ORDER — METRONIDAZOLE 500 MG PO TABS: 500.0000 mg | ORAL_TABLET | Freq: Three times a day (TID) | ORAL | Status: AC

## 2011-12-25 MED ORDER — SULFAMETHOXAZOLE-TRIMETHOPRIM 800-160 MG PO TABS: 1.0000 | ORAL_TABLET | Freq: Two times a day (BID) | ORAL | Status: AC

## 2011-12-25 NOTE — Progress Notes (Signed)
Assessment unchanged.  Scripts were sent directly to pharmacy; pt notified.  D/C instructions were given to pt and friend; all questions were answered.  Pt was D/C'd via wheelchair and accompanied by RN.

## 2011-12-25 NOTE — Discharge Summary (Signed)
Physician Discharge Summary  [name] AOZ:[id number] DOB: November 25, 1950 DOA: 12/20/2011  PCP: Norberto Sorenson, MD  Admit date: 12/20/2011 Discharge date: 12/25/2011  Recommendations for Outpatient Follow-up:  Follow-up Information    Follow up with Norberto Sorenson, MD. (in 1-2weeks, call for appt upon discharge)    Contact information:   [address] [place] [address] [phone]       Follow up with Theda Belfast, MD. (in 1week, call for appt upon discharge)    Contact information:   [address] [place] [address] [phone]           Discharge Diagnoses:  Principal Problem:  *BRBPR (bright red blood per rectum) Active Problems:  Essential hypertension, benign  GERD  Anemia, iron deficiency  Chronic hepatitis C without mention of hepatic coma  GI bleed  Leukocytosis  Hypokalemia  Rectal bleeding  Anemia associated with acute blood loss  Abdominal pain, left lower quadrant  Diverticulitis   Discharge Condition: improved/stable  Diet recommendation: low fiber diet  Wt Readings from Last 3 Encounters:  12/20/11 83.462 kg (184 lb)  12/20/11 83.462 kg (184 lb)  12/20/11 83.462 kg (184 lb)    History of present illness:  The patient is a 61 year old black female with past medical history significant for recurrent GI bleeding found to have bleeding AVMs in the second portion of the duodenum in the past, but per Dr. [name] her last 3 EGDs have identified no bleeding source, also history of iron deficiency anemia,GERD and presented with rectal bleeding. She reported that it worsened through the course of the day, had 5-6 episodes of rectal bleeding -large dark red/maroon clots(all blood with no stool mixed in) shortly after she arrived on the floor. She admitted to lower abd pain.. She declined her physical exam initially and also was refusing transfusions but beginning this am 7/26 - more cooperative.GI was consulted on admission  and tagged redcell sca was done initially see results below, an EGD was done on admission- no bleeding and colonoscopy today 7/26 with Mild diverticulosis - Findings consistent with a left-sided diverticular bleed.    Hospital Course by problem list:  Rectal bleeding secondary to diverticulosis  --On admission Patient seen by GI in the ED and tagged red cell scan done as above revealed-increased radiotracer uptake within the proximal small bowel loops which may be compatible with acute proximal small bowel hemorrhage- gastric ulcer versus erosive gastritis in differential per radiology  -s/p EGD 7/25 -no bleeding source  -s/p colonoscopy this 7/26 Mild diverticulosis - Findings consistent with a left-sided diverticular bleed  -pt finally agreed to transfusion and being transfused as appropriate  -H&H stabilized, she was monitored and the bleeding slowly resolved. On followup today She reports that last BM today she had no blood in it and her hemoglobin this a.m. 8.7. I discussed patient with Dr. [name] and from his standpoint, okay for discharge and she is to followup with them outpatient. She's been instructed to hold off aspirin until she follows up outpatient with GI/PCP-in one to 2 weeks Active Problems:  Diverticulitis  -CT. scan of abdomen and pelvis in while in the hospital with prominent sized colonic diverticulum distal left colon in the left lower abdomen with mild thickening of colonic wall-findings consistent with diverticulitis of short segment colitis.  -Start On Flagyl and Cipro. The patient had severe itching with Cipro(with no rash or shortness of breath) and refused taking it further. This was discussed with Dr. [name] and  he recommended discharging her on Flagyl and Bactrim and this was done. Her abdominal pain was much improved on followup today. Anemia, Acute blood loss  -2/2 to above, 8.3 7/27 (from 9.4 status post transfusion), hgb stable today, continue to monitor and further  adjust his as appropriate.  Essential hypertension, benign  Her antihypertensives were held initially with the GI bleeding, but subsequently with resolution of the bleeding her blood pressures were remaining elevated and her antihypertensives were restarted and she is to continue them upon discharge. GERD  -PPI as above  Chronic hepatitis C without mention of hepatic coma  Leukocytosis  -Possibly secondary to the diverticulitis, resolved. Hypokalemia  -resolved, her potassium was replaced in the hospital   Consultants:  GI - Dr [name] Procedures:  EGD  FINDINGS: A small 2 cm hiatal hernia was identified. There was no  trace of blood in the upper GI tract. No evidence of any  ulcerations, erosions, vascular abnormalities, polyps, or masses.  Retroflexed views revealed no abnormalities. The scope was then  withdrawn from the patient and the procedure terminated.  COMPLICATIONS: None  IMPRESSION: 1) Hiatal hernia  RECOMMENDATIONS:  1) Follow HGB.  2) Transfuse as necessary  Colonoscopy - 7/26  ENDOSCOPIC IMPRESSION:  1) Mild diverticulosis - Findings consistent with a left-sided  diverticular bleed.  2) Small hemorrhoids  RECOMMENDATIONS:  1) Follow HGB.  2) Transfuse as necessary.  Discharge Exam: Filed Vitals:   12/25/11 1437  BP: 174/78  Pulse: 68  Temp: 98 F (36.7 C)  Resp: 16   Filed Vitals:   12/24/11 1523 12/24/11 2253 12/25/11 0554 12/25/11 1437  BP: 156/82 166/87 144/72 174/78  Pulse: 80 88 78 68  Temp: 98.6 F (37 C) 98.8 F (37.1 C) 98.6 F (37 C) 98 F (36.7 C)  TempSrc: Oral Oral Oral Oral  Resp: 18 18 18 16   Height:      Weight:      SpO2: 97% 95% 100% 99%    Exam:  General: alert, more cooperative today, in NAD  Cardiovascular: RRR  Respiratory: clear  Abdomen:soft, BS present,ND mild lower abd tenderness  EXT: no edema   Discharge Instructions  Discharge Orders    Future Appointments: Provider: Department: Dept Phone: Center:    01/11/2012 2:30 PM Windell Hummingbird [facility] [phone] None   01/11/2012 3:00 PM Samul Dada, MD [facility] [phone] None     Future Orders Please Complete By Expires   Diet - low sodium heart healthy      Increase activity slowly        Medication List  As of 12/25/2011  4:34 PM   TAKE these medications         aspirin EC 81 MG tablet   Take 2 tablets (162 mg total) by mouth 2 (two) times daily.      atenolol 50 MG tablet   Commonly known as: TENORMIN   Take 50 mg by mouth daily.      cetirizine 10 MG tablet   Commonly known as: ZYRTEC   Take 10 mg by mouth daily.      esomeprazole 40 MG capsule   Commonly known as: NEXIUM   Take 40 mg by mouth daily before breakfast.      FLUoxetine 20 MG capsule   Commonly known as: PROZAC   Take 1 capsule (20 mg total) by mouth daily.      gabapentin 300 MG capsule   Commonly known as: NEURONTIN   Take  600 mg by mouth 3 (three) times daily.      lisinopril 40 MG tablet   Commonly known as: PRINIVIL,ZESTRIL   Take 40 mg by mouth daily.      metroNIDAZOLE 500 MG tablet   Commonly known as: FLAGYL   Take 1 tablet (500 mg total) by mouth 3 (three) times daily.      sulfamethoxazole-trimethoprim 800-160 MG per tablet   Commonly known as: BACTRIM DS,SEPTRA DS   Take 1 tablet by mouth 2 (two) times daily.      traMADol 50 MG tablet   Commonly known as: ULTRAM   Take 50 mg by mouth every 6 (six) hours as needed. For pain.Maximum dose= 8 tablets per day      traZODone 100 MG tablet   Commonly known as: DESYREL   Take 100 mg by mouth at bedtime.      vitamin C 500 MG tablet   Commonly known as: ASCORBIC ACID   Take 500 mg by mouth daily.           Follow-up Information    Follow up with Norberto Sorenson, MD. (in 1-2weeks, call for appt upon discharge)    Contact information:   [address] [place] [address] [phone]       Follow up with Theda Belfast, MD. (in 1week, call for appt upon  discharge)    Contact information:   [address] [place] [address] [phone]           The results of significant diagnostics from this hospitalization (including imaging, microbiology, ancillary and laboratory) are listed below for reference.    Significant Diagnostic Studies: Mr Abdomen W Wo Contrast  11/28/2011  *RADIOLOGY REPORT*  Clinical Data: Chronic hepatitis C with elevated alpha-fetoprotein level.  Evaluate for possible hepatocellular carcinoma.  MRI ABDOMEN WITH AND WITHOUT CONTRAST  Technique:  Multiplanar multisequence MR imaging of the abdomen was performed both before and after administration of intravenous contrast.  Contrast: 17mL MULTIHANCE GADOBENATE DIMEGLUMINE 529 MG/ML IV SOLN  Comparison: None  Findings: There is diffuse and marked low signal intensity throughout the liver on all of the pulse sequences.  There is also diffuse low signal intensity in the adrenal glands, spleen and bone marrow with evidence of diffuse gamna gandy bodies in the spleen. The pancreas and myocardium are spared.  These findings are consistent with secondary or transfusional hemochromatosis.  There are no MR findings to suggest cirrhosis or focal hepatic mass.  The portal and hepatic veins are patent.  The splenic vein is patent. No splenomegaly.  No ascites or portal venous collaterals.  The pancreas is unremarkable.  The adrenal glands and kidneys are normal.  No mesenteric or retroperitoneal mass or adenopathy.  The aorta is normal in caliber.  The major branch vessels are normal.  The stomach, duodenum, visualized small bowel and visualized colon grossly normal.  The gallbladder is normal.  No common bile duct dilatation.  The pancreatic duct is normal.  IMPRESSION:  1.  MR findings consistent with secondary or transfusional hemochromatosis involving the liver, spleen, adrenal glands and bone marrow. 2.  No findings for cirrhosis, portal hypertension, splenomegaly  or ascites. 3.  No worrisome enhancing liver lesions.  Original Report Authenticated By: P. Loralie Champagne, M.D.   Nm Gi Blood Loss  12/20/2011  *RADIOLOGY REPORT*  Clinical Data: Recurrent GI bleeding  NUCLEAR MEDICINE GASTROINTESTINAL BLEEDING STUDY  Technique:  Sequential abdominal images were obtained following intravenous administration of Tc-77m  labeled red blood cells.  Radiopharmaceutical:  22 mCi of technetium 28m tagged red blood cells.  Comparison: None  Findings: Following the intravenous administration of the radiopharmaceutical there is diffuse and intense radiotracer uptake by the stomach.  There is increased radiotracer uptake within the stomach and proximal small bowel loops. On the dynamic imaging there is antegrade flow of the radiotracer within the jejunum.   No abnormal focus of increased radiotracer uptake identified within the colon to suggest a lower GI bleed.  IMPRESSION:  1.  Increased radiotracer uptake within the proximal small bowel loops which may be compatible with acute proximal small bowel hemorrhage. Differential considerations include gastric ulcer or erosive gastritis.  Original Report Authenticated By: [name], M.D.   Ct Abdomen Pelvis W Contrast  12/23/2011  *RADIOLOGY REPORT*  Clinical Data: Abdominal pain, rectal bleeding, left lower quadrant pain  CT ABDOMEN AND PELVIS WITH CONTRAST  Technique:  Multidetector CT imaging of the abdomen and pelvis was performed following the standard protocol during bolus administration of intravenous contrast.  Contrast: OMNIPAQUE IOHEXOL 300 MG/ML  SOLN  Comparison: None.  Findings: Sagittal images of the lumbar spine are unremarkable. Minimal degenerative changes lower lumbar spine.  Lung bases are unremarkable.  Liver, spleen, pancreas and adrenals are unremarkable.  There is a tiny calcified gallstone within gallbladder measures 2 mm.  Kidneys are symmetrical in size and enhancement.  No hydronephrosis or hydroureter.   Delayed renal images shows bilateral renal symmetrical excretion. Bilateral visualized proximal ureter is unremarkable.  A tiny cyst is noted in the lower pole of the right kidney laterally measures 6 mm.  Atherosclerotic calcifications are noted abdominal aorta.  No aortic aneurysm.  No small bowel obstruction.  No ascites or free air.  No adenopathy.  A few diverticula are noted in the descending colon.  In axial image 49 and 48 there is prominent diverticulum in the left colon with mild stranding of the pericolonic fat.  There is a short segment thickening of the colonic wall in this region. Findings are confirmed on coronal image 32.  Findings are consistent with diverticulitis or short segment colitis.  No pericolonic abscess is noted.  No extraluminal air or contrast material.  Trace fluid noted in the pericolonic fat in axial image 50.  Small amount of free fluid is noted within posterior cul-de-sac. The uterus and adnexa are not identified.  The urinary bladder is unremarkable.  There is no pericecal inflammation.  Normal appendix is clearly visualized axial image 59.  Stool and gas noted within rectum.  No thickening of the rectal wall.  IMPRESSION:  1.  Few diverticula are noted in the left colon.  Trace fluid and mild prominent sized colonic diverticulum distal left colon in the left lower abdomen.  Mild thickening of the colonic wall in this region.  Findings are consistent with diverticulitis or  short segment colitis.  No pericolonic abscess is noted. No extraluminal air is noted. 2.  No hydronephrosis or hydroureter. 3.  Normal appendix.  No pericecal inflammation. 4.  Small amount of pelvic free fluid is noted.  Original Report Authenticated By: Natasha Mead, M.D.    Microbiology: No results found for this or any previous visit (from the past 240 hour(s)).   Labs: Basic Metabolic Panel:  Lab 12/25/11 5784 12/23/11 0516 12/22/11 0505 12/21/11 1345 12/20/11 1130  NA 142 136 137 135 132*  K 3.0*  4.4 3.3* 3.1* 3.4*  CL 107 106 107 103 97  CO2 25  22 21 25 24   GLUCOSE 133* 150* 140* 92 136*  BUN 4* 3* 6 6 8   CREATININE 0.63 0.58 0.61 0.61 0.53  CALCIUM 7.8* 7.8* 7.3* 7.4* 8.7  MG -- -- -- -- --  PHOS -- -- -- -- --   Liver Function Tests:  Lab 12/20/11 1130  AST 104*  ALT 102*  ALKPHOS 88  BILITOT 0.6  PROT 7.1  ALBUMIN 3.2*   No results found for this basename: LIPASE:5,AMYLASE:5 in the last 168 hours No results found for this basename: AMMONIA:5 in the last 168 hours CBC:  Lab 12/25/11 0435 12/24/11 0408 12/23/11 0516 12/22/11 0505 12/21/11 1345 12/20/11 1130  WBC 7.4 8.0 7.1 8.4 11.4* --  NEUTROABS -- -- -- -- -- 10.1*  HGB 8.7* 9.1* 8.6* 8.3* 9.4* --  HCT 25.1* 26.3* 25.1* 23.7* 26.8* --  MCV 89.3 88.0 89.0 88.1 87.3 --  PLT 269 280 210 195 221 --   Cardiac Enzymes: No results found for this basename: CKTOTAL:5,CKMB:5,CKMBINDEX:5,TROPONINI:5 in the last 168 hours BNP: BNP (last 3 results) No results found for this basename: PROBNP:3 in the last 8760 hours CBG: No results found for this basename: GLUCAP:5 in the last 168 hours  Time coordinating discharge: >18minutes  Signed:  Linard Daft C  [facility] 12/25/2011, 4:34 PM

## 2011-12-25 NOTE — Progress Notes (Signed)
Patient refused the Cipro r/t a reaction patient had to this medication earlier yesterday.  Patient had a bowel movement which contained minimal blood in the stool.

## 2012-01-01 ENCOUNTER — Telehealth: Payer: Self-pay

## 2012-01-01 NOTE — Telephone Encounter (Signed)
Pt called asking if Dr Arline Asp will take her on as a general patient because [facility] is closing.

## 2012-01-09 ENCOUNTER — Telehealth: Payer: Self-pay | Admitting: *Deleted

## 2012-01-09 DIAGNOSIS — K5733 Diverticulitis of large intestine without perforation or abscess with bleeding: Secondary | ICD-10-CM | POA: Diagnosis not present

## 2012-01-09 NOTE — Telephone Encounter (Signed)
Patient called to verify appts for 8/16, had to r/s appt from 07/13, patient was in hospital. Informed patient she will have lab and follow up with Dr Arline Asp tomorrow 01/11/12.

## 2012-01-11 ENCOUNTER — Telehealth: Payer: Self-pay | Admitting: Oncology

## 2012-01-11 ENCOUNTER — Ambulatory Visit (HOSPITAL_BASED_OUTPATIENT_CLINIC_OR_DEPARTMENT_OTHER): Payer: Medicare Other | Admitting: Oncology

## 2012-01-11 ENCOUNTER — Encounter: Payer: Self-pay | Admitting: Oncology

## 2012-01-11 ENCOUNTER — Other Ambulatory Visit (HOSPITAL_BASED_OUTPATIENT_CLINIC_OR_DEPARTMENT_OTHER): Payer: Medicare Other | Admitting: Lab

## 2012-01-11 VITALS — BP 175/94 | HR 74 | Temp 99.1°F | Resp 20 | Ht 63.0 in | Wt 182.6 lb

## 2012-01-11 DIAGNOSIS — K922 Gastrointestinal hemorrhage, unspecified: Secondary | ICD-10-CM | POA: Diagnosis not present

## 2012-01-11 DIAGNOSIS — B182 Chronic viral hepatitis C: Secondary | ICD-10-CM

## 2012-01-11 DIAGNOSIS — D500 Iron deficiency anemia secondary to blood loss (chronic): Secondary | ICD-10-CM

## 2012-01-11 DIAGNOSIS — B1920 Unspecified viral hepatitis C without hepatic coma: Secondary | ICD-10-CM

## 2012-01-11 DIAGNOSIS — D509 Iron deficiency anemia, unspecified: Secondary | ICD-10-CM | POA: Diagnosis not present

## 2012-01-11 LAB — CBC WITH DIFFERENTIAL/PLATELET
BASO%: 0.8 % (ref 0.0–2.0)
Basophils Absolute: 0 10*3/uL (ref 0.0–0.1)
EOS%: 1.4 % (ref 0.0–7.0)
HCT: 35.1 % (ref 34.8–46.6)
HGB: 11.5 g/dL — ABNORMAL LOW (ref 11.6–15.9)
LYMPH%: 29.1 % (ref 14.0–49.7)
MCH: 30.3 pg (ref 25.1–34.0)
MCHC: 32.8 g/dL (ref 31.5–36.0)
NEUT%: 55.9 % (ref 38.4–76.8)
Platelets: 346 10*3/uL (ref 145–400)
lymph#: 1.6 10*3/uL (ref 0.9–3.3)

## 2012-01-11 NOTE — Progress Notes (Signed)
This office note has been dictated.  #[id number]

## 2012-01-11 NOTE — Telephone Encounter (Signed)
Gave pt appt for labs , September , October , and November 2013 lab and MD

## 2012-01-12 LAB — COMPREHENSIVE METABOLIC PANEL
ALT: 37 U/L — ABNORMAL HIGH (ref 0–35)
Albumin: 3.5 g/dL (ref 3.5–5.2)
Alkaline Phosphatase: 81 U/L (ref 39–117)
Potassium: 3.3 mEq/L — ABNORMAL LOW (ref 3.5–5.3)
Sodium: 142 mEq/L (ref 135–145)
Total Bilirubin: 0.5 mg/dL (ref 0.3–1.2)
Total Protein: 7.2 g/dL (ref 6.0–8.3)

## 2012-01-12 LAB — IRON AND TIBC
%SAT: 14 % — ABNORMAL LOW (ref 20–55)
Iron: 57 ug/dL (ref 42–145)
UIBC: 354 ug/dL (ref 125–400)

## 2012-01-12 NOTE — Progress Notes (Signed)
CC:   [name]. Patricia Howard, MD  PROBLEM LIST:  1. Recurrent iron-deficiency anemia secondary to  gastrointestinal bleeding. The patient has been heavily dependent upon  IV Feraheme. Over the past year or so she has been receiving about 1  Feraheme infusion monthly on average. The patient's most recent infusions of Feraheme occurred on the following dates:  06/05/2011, 07/03/2011, 07/31/2011, 08/17/2011, 08/28/2011, and the patient received 1020 mg on 10/02/2011. [name] also has required red cell transfusions in the past, most  recently early December 2012. She underwent a small bowel enteroscopy  by Dr. Tilford [name] at [facility]  on 08/13/2011. The enteroscope was passed up to 6 feet into the  jejunum. He saw multiple AVMs, which were ablated. Two of the large  AVMs bled during APC. These were in the 4th part of the duodenum very  close to the ligament of Treitz. The remainder of the AVMs were in the  proximal jejunum. In addition to the small bowel AV malformations,  which were treated with APC, he saw some small esophageal varices which  were not bleeding and also evidence of mild portal hypertensive  gastropathy.   2. History of duodenal arteriovenous malformation.  3. History of hepatitis C infection.  4. Gastroesophageal reflux disease.  5. Depression.  6. Hypertension.  7. History of right Bell's palsy.  8. 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  9. Neurodermatitis.  10. Previous history of alcohol usage. 11. Admission to the hospital from 12/20/2011 through 12/25/2011 for     hematochezia felt to be secondary to a diverticular bleed,     requiring 2 units of packed red cells. 12. Diverticulosis. 13. Elevated alpha fetoprotein, 72.3 on 11/19/2011 and 76.0 on 01/11/2012. 14. Abnormal MRI of the liver from 11/28/2011 showing diffuse and     markedly low signal intensity throughout the liver  as well as     diffuse low signal intensity in the adrenal glands. 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     There were no findings for cirrhosis, portal hypertension,     splenomegaly or ascites, not were there any worrisome enhancing     liver lesions. 15. Systolic ejection murmur.   MEDICATIONS:  1. Atenolol 50 mg daily.  2. Zyrtec 10 mg daily.  3. Nexium 40 mg daily.  4. Neurontin 600 mg 3 times daily.  5. Lisinopril 40 mg daily.  6. Potassium chloride 20 mEq daily.  7. Ultram 50 mg every 6 hours as needed.  8. Desyrel 100 mg at bedtime.  9. Triamcinolone cream 0.1% apply twice daily as needed. 10. Aspirin 81 mg twice daily. 11. Prozac 20 mg daily. 12. Hydrochlorothiazide 12.5 mg daily. 13. Vitamin C 500 mg daily.   SMOKING HISTORY:  The patient smoked up to a pack and a half of cigarettes a day from age 41 and continues to smoke a couple of cigarettes a day at the present time.   HISTORY:  I saw [name] today for followup of her recurrent iron- deficiency anemia felt to be due to GI bleeding.  Initially we thought this was due to AV malformations although the patient had a recent admission to the hospital from 12/20/2011 through 12/25/2011 with hematochezia requiring 2 units of packed red cells.  Workup during that admission suggested a diverticular bleed.  The patient has seen Dr. Jeani [name], and he will be making a  referral for consideration of colonic resection.  [name] was last seen by [place] on 08/28/2011.  She is accompanied by an aide.  She lives by herself.  At the present time, she denies any evidence of GI bleeding.  In general, she feels well.  She does admit to some loss of appetite.  She does have some degree of weakness but is able to manage by herself.  She is here today with an aide.  The patient is most concerned about the closure of [facility], where she was receiving her medical care and her  prescriptions.  At her request, we will be refilling her Prozac prescription.  As stated, she is without any major complaints today.  PHYSICAL EXAMINATION:  She looks well and is in good spirits.  Weight today is 182.6 pounds.  Height 5 feet 3 inches.  Body surface area 1.92 sq m.  Blood pressure 175/94.  Other vital signs are normal except for a temperature of 99.1.  The patient is in a wheelchair.  She does use a walker when she has her prosthesis on.  She has had an above-the-knee amputation of the left leg back in October 2010.  There was no scleral icterus.  Mouth and pharynx are benign.  No peripheral adenopathy palpable.  She had some rhonchi that cleared with deep breathing and cough.  The patient was urged to stopped smoking.  Cardiac:  Regular rhythm with systolic ejection murmur.  Abdomen with the patient sitting is benign, somewhat obese.  There is some puffiness of her right lower leg without pitting edema.  She has had an above-the-knee amputation on the left.  Her neurodermatitis looks to be improved.  Neurologic: Normal.  LABORATORY DATA:  Today, white count 5.4, ANC 3.0, hemoglobin 11.5, hematocrit 35.1, platelets 346,000.  Chemistries, iron studies, alpha fetoprotein and a ceruloplasmin level are pending.  Chemistries from 08/28/2011 were notable for an AST of 99, ALT of 54.  Albumin was 3.6. LDH 148.  BUN 15, creatinine 0.80.  Ferritin on 08/28/2011 was 304 but decreased to 123 on 09/25/2011.  After the patient received IV Feraheme 1020 mg on May 7th, her ferritin level on 10/26/2011 was 658.  On 11/19/2011, ferritin was 480 with an iron saturation of 25%.  Alpha fetoprotein on 11/19/2011 was 72.3 with normal being 0-8.0.  Hepatitis A and B were negative.  However, hepatitis C antibody from October 2009 was reactive on 2 separate occasions.  IMAGING STUDIES: 1. Screening bilateral mammograms from 01/19/2011 showed     calcifications and possible mass involving the  left breast.     Additional studies were recommended.  Diagnostic limited mammogram     of the left breast from 02/20/2011 showed no persistent worrisome     abnormality upon additional imaging of the left breast.  Yearly     screening mammography was suggested. 2. Chest x-ray, 2 view, from 05/04/2011 showed lingular scarring     without active disease. 3. MRI of the abdomen with and without IV contrast from 11/28/2011     showed changes consistent with secondary or transfusional     hemosiderosis involving the liver, spleen, adrenal glands and bone     marrow.  There were no findings of cirrhosis, portal hypertension,     splenomegaly or ascites and no worrisome enhancing liver lesions.     Gallbladder was normal.  There was no common bile duct dilatation,     and the pancreatic duct was normal. 4. Nuclear medicine gastrointestinal bleeding  study from 12/20/2011     showed increased radiotracer uptake within the proximal small bowel     loops which may be compatible with acute proximal small bowel     hemorrhage.  Differential diagnosis included gastric ulcer or     erosive gastritis. 5. CT scan of the abdomen and pelvis with IV contrast from 12/23/2011     showed a few diverticula noted in the left colon.  There was trace     fluid and mild prominent-sized colonic diverticulum in the distal     left colon in the left lower abdomen and mild thickening of the     colonic wall in this region.  There was no pericolonic abscess     noted.  No hydronephrosis or hydroureter.  PROCEDURES: 1. The patient underwent upper endoscopy on 12/20/2011 with negative     findings. 2. Colonoscopy carried out by Dr. Jeani [name] on 12/21/2011 disclosed     mild diverticulosis with findings felt to be consistent with a left-     sided diverticular bleed.  There were also small hemorrhoids.  IMPRESSION/PLAN:  At the present time, [name]'s condition is stable.  She denies any evidence of  gastrointestinal bleeding, and her hemoglobin today is 11.5, hematocrit 35.1.  Red cell indices are normal. Her most recent ferritin on 11/19/2011 was 480.  We await her lab studies today.  The patient apparently is going to be seeing a surgeon with regard to consideration of a resection of her left colon as it was suspected that her bleeding was secondary to diverticulosis.  I am perplexed by the MRI findings.  In the past we have been treating this patient for iron- deficiency anemia.  She should not have any evidence for transfusion hemosiderosis.  I do not think it is likely that she has  hereditary hemochromatosis, but we could do genetic testing to investigate this further.  I am concerned that she could have Wilson disease to account for the MRI findings.  It is also worrisome that her alpha fetoprotein is so elevated.  On the other hand, this may be consistent with her known diagnosis of hepatitis C infection.  That diagnosis apparently goes back to 1997.  The patient has also been a heavy drinker in the past.  She denies drinking alcohol at the present time.  Questions about this patient's liver disease and questions about whether treatment is indicated for her hepatitis C infection will need to be directed to Dr. Elnoria [name] or perhaps [facility].  With regard to this patient's recurrent iron deficiency from bleeding, we will continue to monitor her CBC and ferritin on a monthly basis.  We will plan to see her again in approximately 3 months, which should be around November 8th, at which time we will check CBC, chemistries and iron studies.  We will also check an alpha fetoprotein.  Extensive review of this patient's recent hospitalization was carried out.    ______________________________ Samul Dada, M.D. DSM/MEDQ  D:  01/11/2012  T:  01/12/2012  Job:  [id number]

## 2012-01-13 ENCOUNTER — Encounter: Payer: Self-pay | Admitting: Oncology

## 2012-01-13 NOTE — Progress Notes (Signed)
Ferritin on 01/11/12 came back 55, down from 480 on 11/19/11 and 658 on 10/26/11.  I have ordered IV Feraheme 1020 mg for about 01/16/12.

## 2012-01-14 ENCOUNTER — Other Ambulatory Visit: Payer: Self-pay | Admitting: Medical Oncology

## 2012-01-14 ENCOUNTER — Telehealth: Payer: Self-pay | Admitting: *Deleted

## 2012-01-14 ENCOUNTER — Encounter: Payer: Self-pay | Admitting: Medical Oncology

## 2012-01-14 MED ORDER — FLUOXETINE HCL 20 MG PO CAPS: 20.0000 mg | ORAL_CAPSULE | Freq: Every day | ORAL | Status: DC

## 2012-01-14 NOTE — Telephone Encounter (Signed)
I called pt per Dr. Arline Asp to inform her that ferritin has dropped from 480 (11/19/11) to 55 (01/11/12). He is going to set her up for feraheme this week. She is aware that the schedulers will call her with an appt. I also informed her that Dr. Arline Asp refilled her prozac and the prescription sent to her pharmacy.

## 2012-01-14 NOTE — Telephone Encounter (Signed)
Per staff message I have scheduled appt. JMW 

## 2012-01-15 ENCOUNTER — Telehealth: Payer: Self-pay | Admitting: Oncology

## 2012-01-15 NOTE — Telephone Encounter (Signed)
lmonvm adviisng the pt of her iron tx this week on 01/17/2012

## 2012-01-17 ENCOUNTER — Ambulatory Visit: Payer: Medicare Other

## 2012-01-17 NOTE — Progress Notes (Signed)
No note

## 2012-01-21 ENCOUNTER — Telehealth (INDEPENDENT_AMBULATORY_CARE_PROVIDER_SITE_OTHER): Payer: Self-pay | Admitting: General Surgery

## 2012-01-21 ENCOUNTER — Encounter (INDEPENDENT_AMBULATORY_CARE_PROVIDER_SITE_OTHER): Payer: Self-pay | Admitting: Surgery

## 2012-01-21 ENCOUNTER — Ambulatory Visit (INDEPENDENT_AMBULATORY_CARE_PROVIDER_SITE_OTHER): Payer: Medicare Other | Admitting: Surgery

## 2012-01-21 VITALS — BP 161/100 | HR 80 | Temp 98.6°F | Resp 18

## 2012-01-21 DIAGNOSIS — K5731 Diverticulosis of large intestine without perforation or abscess with bleeding: Secondary | ICD-10-CM | POA: Diagnosis not present

## 2012-01-21 NOTE — Telephone Encounter (Signed)
Called in Rx to [facility]. [place] [phone]. Spoke to [place] and called in [place] for one day bowel prep

## 2012-01-21 NOTE — Progress Notes (Signed)
Patient ID: [name], female   DOB: March 04, 1951, 61 y.o.   MRN: [id number]  Chief Complaint  Patient presents with  . Other    lower GI bleed    HPI [name] is a 61 y.o. female.   HPI This is a very pleasant female referred by Dr. [name] for recent admission for diverticular bleeding. She had a significant bleeder that time requiring transfusions. She also has a significant history of iron deficiency anemia and small bowel AVMs. She has also had some mild diverticulitis based on CAT scan. Currently she is doing well and had no more bleeding per rectum. She denies abdominal pain. She is becoming slightly constipated for fear of having a bowel movement. She is afraid this would start her bleeding again. She is followed very closely by Dr. Arline Asp regarding her anemia. Past Medical History  Diagnosis Date  . Anemia, iron deficiency 05/03/2011  . Anemia   . Hypertension   . Colon polyps   . Stomach problems   . Hypokalemic alkalosis   . Allergic rhinitis   . GERD (gastroesophageal reflux disease)     Past Surgical History  Procedure Date  . Leg amputation above knee   . Esophagogastroduodenoscopy 05/05/2011    Procedure: ESOPHAGOGASTRODUODENOSCOPY (EGD);  Surgeon: Erick Blinks, MD;  Location: [facility];  Service: Gastroenterology;  Laterality: N/A;  Dr. Rhea Belton will do procedure for Dr. [name] Saturday.  . Esophagogastroduodenoscopy 05/08/2011    Procedure: ESOPHAGOGASTRODUODENOSCOPY (EGD);  Surgeon: Theda Belfast;  Location: [facility];  Service: Endoscopy;  Laterality: N/A;  . Esophagogastroduodenoscopy 06/07/2011    Procedure: ESOPHAGOGASTRODUODENOSCOPY (EGD);  Surgeon: Theda Belfast, MD;  Location: [facility];  Service: Endoscopy;  Laterality: N/A;  . Hot hemostasis 06/07/2011    Procedure: HOT HEMOSTASIS (ARGON PLASMA COAGULATION/BICAP);  Surgeon: Theda Belfast, MD;  Location: [facility];  Service: Endoscopy;  Laterality: N/A;  . Esophagogastroduodenoscopy 12/20/2011      Procedure: ESOPHAGOGASTRODUODENOSCOPY (EGD);  Surgeon: Theda Belfast, MD;  Location: [facility];  Service: Endoscopy;  Laterality: N/A;  . Flexible sigmoidoscopy 12/21/2011    Procedure: FLEXIBLE SIGMOIDOSCOPY;  Surgeon: Theda Belfast, MD;  Location: [facility];  Service: Endoscopy;  Laterality: N/A;    No family history on file.  Social History History  Substance Use Topics  . Smoking status: Current Some Day Smoker -- 0.2 packs/day  . Smokeless tobacco: Never Used  . Alcohol Use: Yes     occasionally    Allergies  Allergen Reactions  . Ciprofloxacin Hives and Swelling  . Morphine And Related Other (See Comments)    Makes pt sad and paranoid  . Codeine Hives and Swelling  . Penicillins Hives and Swelling    Current Outpatient Prescriptions  Medication Sig Dispense Refill  . aspirin EC 81 MG tablet Take 2 tablets (162 mg total) by mouth 2 (two) times daily.      Marland Kitchen atenolol (TENORMIN) 50 MG tablet Take 50 mg by mouth daily.      . cetirizine (ZYRTEC) 10 MG tablet Take 10 mg by mouth daily.        Marland Kitchen esomeprazole (NEXIUM) 40 MG capsule Take 40 mg by mouth daily before breakfast.      . FLUoxetine (PROZAC) 20 MG capsule Take 1 capsule (20 mg total) by mouth daily.  30 capsule  6  . gabapentin (NEURONTIN) 300 MG capsule Take 600 mg by mouth 3 (three) times daily.        . hydrochlorothiazide (HYDRODIURIL) 12.5  MG tablet Take 12.5 mg by mouth daily.      Marland Kitchen lisinopril (PRINIVIL,ZESTRIL) 40 MG tablet Take 40 mg by mouth daily.        . potassium chloride SA (K-DUR,KLOR-CON) 20 MEQ tablet Take 20 mEq by mouth daily.      . traMADol (ULTRAM) 50 MG tablet Take 50 mg by mouth every 6 (six) hours as needed. For pain.Maximum dose= 8 tablets per day       . traZODone (DESYREL) 100 MG tablet Take 100 mg by mouth at bedtime.        . vitamin C (ASCORBIC ACID) 500 MG tablet Take 500 mg by mouth daily.        Review of Systems Review of Systems  Constitutional: Negative.   HENT:  Positive for hearing loss.   Eyes: Negative.   Respiratory: Negative.   Cardiovascular: Positive for leg swelling.  Gastrointestinal: Positive for blood in stool, anal bleeding and rectal pain. Negative for nausea and abdominal pain.  Genitourinary: Negative.   Musculoskeletal: Positive for joint swelling and gait problem.  Neurological: Negative.   Psychiatric/Behavioral: Positive for confusion.    Blood pressure 161/100, pulse 80, temperature 98.6 F (37 C), temperature source Temporal, resp. rate 18.  Physical Exam Physical Exam  Constitutional: She is oriented to person, place, and time. She appears well-developed and well-nourished. No distress.       She is wheelchair-bound  HENT:  Head: Normocephalic and atraumatic.  Right Ear: External ear normal.  Left Ear: External ear normal.  Nose: Nose normal.  Mouth/Throat: Oropharynx is clear and moist. No oropharyngeal exudate.  Eyes: Conjunctivae are normal. Pupils are equal, round, and reactive to light. Right eye exhibits no discharge. No scleral icterus.  Neck: Normal range of motion. Neck supple. No tracheal deviation present.  Cardiovascular: Normal rate, regular rhythm, normal heart sounds and intact distal pulses.   No murmur heard. Pulmonary/Chest: Effort normal and breath sounds normal. No respiratory distress. She has no wheezes.  Abdominal: Soft. Bowel sounds are normal. She exhibits no distension. There is no tenderness. There is no rebound.  Musculoskeletal: She exhibits no edema and no tenderness.       Left lower extremity  amputation  Lymphadenopathy:    She has no cervical adenopathy.  Neurological: She is alert and oriented to person, place, and time. No cranial nerve deficit. Coordination normal.  Skin: Skin is warm and dry. She is not diaphoretic. No erythema. No pallor.  Psychiatric: Her behavior is normal. Judgment normal.    Data Reviewed I have reviewed the notes from her hospital admission as well as  from her hematologist  Assessment    Lower GI/diverticular bleeding    Plan    I had a long discussion with the patient and her caregiver. Certainly, partial colectomy would solve the diverticular bleeding issue but would have no effect on her small bowel AVMs. She is well aware of this. She still wishes to proceed with partial colectomy given her fear of having bowel movements secondary to that large amount of bleeding which required transfusions. I believe this is reasonable. I had a long discussion with her regarding the surgery. I discussed the risks of surgery which includes but is not limited to bleeding, infection, injury to surrounding structures, anastomotic leak, need for further surgery, ostomy formation, cardiopulmonary issues, et Karie Soda. She understands and wishes to proceed. Surgery will be scheduled.       Eiliana Drone A 01/21/2012, 10:42 AM

## 2012-01-24 ENCOUNTER — Telehealth: Payer: Self-pay

## 2012-01-24 NOTE — Telephone Encounter (Signed)
Pt called [phone] re an iron appt. She never got her voice message. Returned call and rescheduled missed feraheme infusion appt of 8/22 to 8/30 at 1215 and verified with patient.

## 2012-01-25 ENCOUNTER — Ambulatory Visit: Payer: Medicare Other

## 2012-01-25 ENCOUNTER — Telehealth: Payer: Self-pay | Admitting: *Deleted

## 2012-01-25 NOTE — Telephone Encounter (Signed)
Patient called and moved her ppt from today to next week. Patient has transportation issues. JMW

## 2012-01-30 ENCOUNTER — Ambulatory Visit (HOSPITAL_BASED_OUTPATIENT_CLINIC_OR_DEPARTMENT_OTHER): Payer: Medicare Other

## 2012-01-30 VITALS — BP 148/78 | HR 72 | Temp 98.7°F | Resp 12

## 2012-01-30 DIAGNOSIS — D509 Iron deficiency anemia, unspecified: Secondary | ICD-10-CM | POA: Diagnosis not present

## 2012-01-30 MED ORDER — SODIUM CHLORIDE 0.9 % IV SOLN
1020.0000 mg | Freq: Once | INTRAVENOUS | Status: AC
  Administered 2012-01-30: 1020 mg via INTRAVENOUS
  Filled 2012-01-30: qty 34

## 2012-01-30 NOTE — Patient Instructions (Addendum)
[facility] Discharge Instructions for Patients Today you received the following: feraheme  BELOW ARE SYMPTOMS THAT SHOULD BE REPORTED IMMEDIATELY:  *FEVER GREATER THAN 100.5 F  *CHILLS WITH OR WITHOUT FEVER  NAUSEA AND VOMITING THAT IS NOT CONTROLLED WITH YOUR NAUSEA MEDICATION  *UNUSUAL SHORTNESS OF BREATH  *UNUSUAL BRUISING OR BLEEDING  TENDERNESS IN MOUTH AND THROAT WITH OR WITHOUT PRESENCE OF ULCERS  *URINARY PROBLEMS  *BOWEL PROBLEMS  UNUSUAL RASH Items with * indicate a potential emergency and should be followed up as soon as possible.  One of the nurses will contact you 24 hours after your treatment. Please let the nurse know about any problems that you may have experienced. Feel free to call the clinic you have any questions or concerns. The clinic phone number is [phone].   I have been informed and understand all the instructions given to me. I know to contact the clinic, my physician, or go to the Emergency Department if any problems should occur. I do not have any questions at this time, but understand that I may call the clinic during office hours   should I have any questions or need assistance in obtaining follow up care.    __________________________________________  _____________  __________ Signature of Patient or Authorized Representative            Date                   Time    __________________________________________ Nurse's Signature    

## 2012-02-11 ENCOUNTER — Other Ambulatory Visit: Payer: Medicare Other | Admitting: Lab

## 2012-02-12 ENCOUNTER — Encounter (HOSPITAL_COMMUNITY): Payer: Self-pay | Admitting: Pharmacy Technician

## 2012-02-13 ENCOUNTER — Encounter (HOSPITAL_COMMUNITY): Payer: Self-pay

## 2012-02-13 ENCOUNTER — Encounter (INDEPENDENT_AMBULATORY_CARE_PROVIDER_SITE_OTHER): Payer: Self-pay

## 2012-02-13 ENCOUNTER — Encounter (HOSPITAL_COMMUNITY)
Admission: RE | Admit: 2012-02-13 | Discharge: 2012-02-13 | Disposition: A | Discharge status: Home / Self Care | Payer: Medicare Other | Source: Ambulatory Visit | Attending: Surgery | Admitting: Surgery

## 2012-02-13 HISTORY — DX: Cerebral infarction, unspecified: I63.9

## 2012-02-13 HISTORY — DX: Peripheral vascular disease, unspecified: I73.9

## 2012-02-13 HISTORY — DX: Sleep disorder, unspecified: G47.9

## 2012-02-13 LAB — CBC
HCT: 35.5 % — ABNORMAL LOW (ref 36.0–46.0)
Hemoglobin: 11.4 g/dL — ABNORMAL LOW (ref 12.0–15.0)
MCH: 29.1 pg (ref 26.0–34.0)
MCV: 90.6 fL (ref 78.0–100.0)
RBC: 3.92 MIL/uL (ref 3.87–5.11)
WBC: 5.3 10*3/uL (ref 4.0–10.5)

## 2012-02-13 LAB — BASIC METABOLIC PANEL
BUN: 7 mg/dL (ref 6–23)
CO2: 29 mEq/L (ref 19–32)
Chloride: 102 mEq/L (ref 96–112)
Creatinine, Ser: 0.64 mg/dL (ref 0.50–1.10)
Glucose, Bld: 106 mg/dL — ABNORMAL HIGH (ref 70–99)

## 2012-02-13 MED ORDER — CHLORHEXIDINE GLUCONATE 4 % EX LIQD
1.0000 | Freq: Once | CUTANEOUS | Status: DC
Start: 2012-02-14 — End: 2012-02-14
  Filled 2012-02-13: qty 15

## 2012-02-13 NOTE — Patient Instructions (Signed)
20 [name]  02/13/2012   Your procedure is scheduled on: 02/15/12 9:30 AM  Report to SHORT STAY DEPT  At   7:00 AM.  Call this number if you have problems the morning of surgery: [phone]   Remember:   Do not eat food or drink liquids AFTER MIDNIGHT    Take these medicines the morning of surgery with A SIP OF WATER: ATENOLOL / ZYRTEC / NEXIUM / PROZAC / NEURONTIN /             TRAMADOL   Do not wear jewelry, make-up or nail polish.  Do not wear lotions, powders, or perfumes.   Do not shave legs or underarms 48 hrs. before surgery (men may shave face)  Do not bring valuables to the hospital.  Contacts, dentures or bridgework may not be worn into surgery.  Leave suitcase in the car. After surgery it may be brought to your room.  For patients admitted to the hospital, checkout time is 11:00 AM the day of discharge.   Patients discharged the day of surgery will not be allowed to drive home. If going home same day of surgery, must have someone stay with you first 24 hrs at home and arrange for some one to drive you home from hospital.    Special Instructions:   Please read over the following fact sheets that you were given: MRSA  Information               SHOWER WITH BETASEPT THE NIGHT BEFORE SURGERY AND THE MORNING OF SURGERY               FOLLOW BOWEL PREP          X_______________________________________________________________

## 2012-02-13 NOTE — Progress Notes (Signed)
Abnormal BMET routed to Dr. Barrie Dunker

## 2012-02-14 NOTE — H&P (Signed)
Patient ID: [name], female DOB: 06-Apr-1951, 61 y.o. MRN: [id number]  Chief Complaint   Patient presents with   .  Other     lower GI bleed    HPI  [name] is a 61 y.o. female.  HPI  This is a very pleasant female referred by Dr. [name] for recent admission for diverticular bleeding. She had a significant bleeder that time requiring transfusions. She also has a significant history of iron deficiency anemia and small bowel AVMs. She has also had some mild diverticulitis based on CAT scan. Currently she is doing well and had no more bleeding per rectum. She denies abdominal pain. She is becoming slightly constipated for fear of having a bowel movement. She is afraid this would start her bleeding again. She is followed very closely by Dr. Arline Asp regarding her anemia.  Past Medical History   Diagnosis  Date   .  Anemia, iron deficiency  05/03/2011   .  Anemia    .  Hypertension    .  Colon polyps    .  Stomach problems    .  Hypokalemic alkalosis    .  Allergic rhinitis    .  GERD (gastroesophageal reflux disease)     Past Surgical History   Procedure  Date   .  Leg amputation above knee    .  Esophagogastroduodenoscopy  05/05/2011     Procedure: ESOPHAGOGASTRODUODENOSCOPY (EGD); Surgeon: Erick Blinks, MD; Location: [facility]; Service: Gastroenterology; Laterality: N/A; Dr. Rhea Belton will do procedure for Dr. [name] Saturday.   .  Esophagogastroduodenoscopy  05/08/2011     Procedure: ESOPHAGOGASTRODUODENOSCOPY (EGD); Surgeon: Theda Belfast; Location: [facility]; Service: Endoscopy; Laterality: N/A;   .  Esophagogastroduodenoscopy  06/07/2011     Procedure: ESOPHAGOGASTRODUODENOSCOPY (EGD); Surgeon: Theda Belfast, MD; Location: [facility]; Service: Endoscopy; Laterality: N/A;   .  Hot hemostasis  06/07/2011     Procedure: HOT HEMOSTASIS (ARGON PLASMA COAGULATION/BICAP); Surgeon: Theda Belfast, MD; Location: [facility]; Service: Endoscopy; Laterality: N/A;   .   Esophagogastroduodenoscopy  12/20/2011     Procedure: ESOPHAGOGASTRODUODENOSCOPY (EGD); Surgeon: Theda Belfast, MD; Location: [facility]; Service: Endoscopy; Laterality: N/A;   .  Flexible sigmoidoscopy  12/21/2011     Procedure: FLEXIBLE SIGMOIDOSCOPY; Surgeon: Theda Belfast, MD; Location: [facility]; Service: Endoscopy; Laterality: N/A;    No family history on file.  Social History  History   Substance Use Topics   .  Smoking status:  Current Some Day Smoker -- 0.2 packs/day   .  Smokeless tobacco:  Never Used   .  Alcohol Use:  Yes      occasionally    Allergies   Allergen  Reactions   .  Ciprofloxacin  Hives and Swelling   .  Morphine And Related  Other (See Comments)     Makes pt sad and paranoid   .  Codeine  Hives and Swelling   .  Penicillins  Hives and Swelling    Current Outpatient Prescriptions   Medication  Sig  Dispense  Refill   .  aspirin EC 81 MG tablet  Take 2 tablets (162 mg total) by mouth 2 (two) times daily.     Marland Kitchen  atenolol (TENORMIN) 50 MG tablet  Take 50 mg by mouth daily.     .  cetirizine (ZYRTEC) 10 MG tablet  Take 10 mg by mouth daily.     Marland Kitchen  esomeprazole (NEXIUM) 40 MG capsule  Take  40 mg by mouth daily before breakfast.     .  FLUoxetine (PROZAC) 20 MG capsule  Take 1 capsule (20 mg total) by mouth daily.  30 capsule  6   .  gabapentin (NEURONTIN) 300 MG capsule  Take 600 mg by mouth 3 (three) times daily.     .  hydrochlorothiazide (HYDRODIURIL) 12.5 MG tablet  Take 12.5 mg by mouth daily.     Marland Kitchen  lisinopril (PRINIVIL,ZESTRIL) 40 MG tablet  Take 40 mg by mouth daily.     .  potassium chloride SA (K-DUR,KLOR-CON) 20 MEQ tablet  Take 20 mEq by mouth daily.     .  traMADol (ULTRAM) 50 MG tablet  Take 50 mg by mouth every 6 (six) hours as needed. For pain.Maximum dose= 8 tablets per day     .  traZODone (DESYREL) 100 MG tablet  Take 100 mg by mouth at bedtime.     .  vitamin C (ASCORBIC ACID) 500 MG tablet  Take 500 mg by mouth daily.      Review of  Systems  Review of Systems  Constitutional: Negative.  HENT: Positive for hearing loss.  Eyes: Negative.  Respiratory: Negative.  Cardiovascular: Positive for leg swelling.  Gastrointestinal: Positive for blood in stool, anal bleeding and rectal pain. Negative for nausea and abdominal pain.  Genitourinary: Negative.  Musculoskeletal: Positive for joint swelling and gait problem.  Neurological: Negative.  Psychiatric/Behavioral: Positive for confusion.   Blood pressure 161/100, pulse 80, temperature 98.6 F (37 C), temperature source Temporal, resp. rate 18.  Physical Exam  Physical Exam  Constitutional: She is oriented to person, place, and time. She appears well-developed and well-nourished. No distress.  She is wheelchair-bound  HENT:  Head: Normocephalic and atraumatic.  Right Ear: External ear normal.  Left Ear: External ear normal.  Nose: Nose normal.  Mouth/Throat: Oropharynx is clear and moist. No oropharyngeal exudate.  Eyes: Conjunctivae are normal. Pupils are equal, round, and reactive to light. Right eye exhibits no discharge. No scleral icterus.  Neck: Normal range of motion. Neck supple. No tracheal deviation present.  Cardiovascular: Normal rate, regular rhythm, normal heart sounds and intact distal pulses.  No murmur heard.  Pulmonary/Chest: Effort normal and breath sounds normal. No respiratory distress. She has no wheezes.  Abdominal: Soft. Bowel sounds are normal. She exhibits no distension. There is no tenderness. There is no rebound.  Musculoskeletal: She exhibits no edema and no tenderness.  Left lower extremity amputation  Lymphadenopathy:  She has no cervical adenopathy.  Neurological: She is alert and oriented to person, place, and time. No cranial nerve deficit. Coordination normal.  Skin: Skin is warm and dry. She is not diaphoretic. No erythema. No pallor.  Psychiatric: Her behavior is normal. Judgment normal.   Data Reviewed  I have reviewed the  notes from her hospital admission as well as from her hematologist  Assessment   Lower GI/diverticular bleeding   Plan   I had a long discussion with the patient and her caregiver. Certainly, partial colectomy would solve the diverticular bleeding issue but would have no effect on her small bowel AVMs. She is well aware of this. She still wishes to proceed with partial colectomy given her fear of having bowel movements secondary to that large amount of bleeding which required transfusions. I believe this is reasonable. I had a long discussion with her regarding the surgery. I discussed the risks of surgery which includes but is not limited to bleeding, infection, injury to  surrounding structures, anastomotic leak, need for further surgery, ostomy formation, cardiopulmonary issues, et Karie Soda. She understands and wishes to proceed. Surgery will be scheduled.   Azaryah Oleksy A

## 2012-02-15 ENCOUNTER — Inpatient Hospital Stay (HOSPITAL_COMMUNITY): Payer: Medicare Other | Admitting: Anesthesiology

## 2012-02-15 ENCOUNTER — Encounter (HOSPITAL_COMMUNITY): Payer: Self-pay | Admitting: *Deleted

## 2012-02-15 ENCOUNTER — Encounter (HOSPITAL_COMMUNITY)
Admission: RE | Disposition: A | Discharge status: Home / Self Care | Payer: Self-pay | Source: Ambulatory Visit | Attending: Surgery

## 2012-02-15 ENCOUNTER — Encounter (HOSPITAL_COMMUNITY): Payer: Self-pay | Admitting: Anesthesiology

## 2012-02-15 ENCOUNTER — Inpatient Hospital Stay (HOSPITAL_COMMUNITY)
Admission: RE | Admit: 2012-02-15 | Discharge: 2012-02-28 | DRG: 329 | Disposition: A | Discharge status: Home / Self Care | Payer: Medicare Other | Source: Ambulatory Visit | Attending: Surgery | Admitting: Surgery

## 2012-02-15 ENCOUNTER — Encounter: Payer: Medicare Other | Admitting: Internal Medicine

## 2012-02-15 DIAGNOSIS — Z01812 Encounter for preprocedural laboratory examination: Secondary | ICD-10-CM | POA: Diagnosis not present

## 2012-02-15 DIAGNOSIS — Z7982 Long term (current) use of aspirin: Secondary | ICD-10-CM

## 2012-02-15 DIAGNOSIS — IMO0002 Reserved for concepts with insufficient information to code with codable children: Secondary | ICD-10-CM | POA: Diagnosis not present

## 2012-02-15 DIAGNOSIS — D62 Acute posthemorrhagic anemia: Secondary | ICD-10-CM | POA: Diagnosis present

## 2012-02-15 DIAGNOSIS — K5900 Constipation, unspecified: Secondary | ICD-10-CM | POA: Diagnosis present

## 2012-02-15 DIAGNOSIS — S78119A Complete traumatic amputation at level between unspecified hip and knee, initial encounter: Secondary | ICD-10-CM | POA: Diagnosis not present

## 2012-02-15 DIAGNOSIS — K922 Gastrointestinal hemorrhage, unspecified: Secondary | ICD-10-CM | POA: Diagnosis not present

## 2012-02-15 DIAGNOSIS — T8140XA Infection following a procedure, unspecified, initial encounter: Secondary | ICD-10-CM | POA: Diagnosis not present

## 2012-02-15 DIAGNOSIS — B182 Chronic viral hepatitis C: Secondary | ICD-10-CM | POA: Diagnosis present

## 2012-02-15 DIAGNOSIS — I10 Essential (primary) hypertension: Secondary | ICD-10-CM | POA: Diagnosis present

## 2012-02-15 DIAGNOSIS — M726 Necrotizing fasciitis: Secondary | ICD-10-CM | POA: Diagnosis not present

## 2012-02-15 DIAGNOSIS — Z79899 Other long term (current) drug therapy: Secondary | ICD-10-CM | POA: Diagnosis not present

## 2012-02-15 DIAGNOSIS — Z6834 Body mass index (BMI) 34.0-34.9, adult: Secondary | ICD-10-CM

## 2012-02-15 DIAGNOSIS — E876 Hypokalemia: Secondary | ICD-10-CM | POA: Diagnosis not present

## 2012-02-15 DIAGNOSIS — K5733 Diverticulitis of large intestine without perforation or abscess with bleeding: Principal | ICD-10-CM | POA: Diagnosis present

## 2012-02-15 DIAGNOSIS — K219 Gastro-esophageal reflux disease without esophagitis: Secondary | ICD-10-CM | POA: Diagnosis present

## 2012-02-15 DIAGNOSIS — F17200 Nicotine dependence, unspecified, uncomplicated: Secondary | ICD-10-CM | POA: Diagnosis present

## 2012-02-15 DIAGNOSIS — R0902 Hypoxemia: Secondary | ICD-10-CM | POA: Diagnosis not present

## 2012-02-15 DIAGNOSIS — R109 Unspecified abdominal pain: Secondary | ICD-10-CM | POA: Diagnosis not present

## 2012-02-15 DIAGNOSIS — R918 Other nonspecific abnormal finding of lung field: Secondary | ICD-10-CM | POA: Diagnosis not present

## 2012-02-15 DIAGNOSIS — R1032 Left lower quadrant pain: Secondary | ICD-10-CM | POA: Diagnosis present

## 2012-02-15 DIAGNOSIS — Y838 Other surgical procedures as the cause of abnormal reaction of the patient, or of later complication, without mention of misadventure at the time of the procedure: Secondary | ICD-10-CM | POA: Diagnosis present

## 2012-02-15 DIAGNOSIS — D72829 Elevated white blood cell count, unspecified: Secondary | ICD-10-CM | POA: Diagnosis present

## 2012-02-15 DIAGNOSIS — D509 Iron deficiency anemia, unspecified: Secondary | ICD-10-CM | POA: Diagnosis present

## 2012-02-15 DIAGNOSIS — Z23 Encounter for immunization: Secondary | ICD-10-CM

## 2012-02-15 DIAGNOSIS — E871 Hypo-osmolality and hyponatremia: Secondary | ICD-10-CM | POA: Diagnosis not present

## 2012-02-15 DIAGNOSIS — R4182 Altered mental status, unspecified: Secondary | ICD-10-CM | POA: Diagnosis not present

## 2012-02-15 DIAGNOSIS — Z993 Dependence on wheelchair: Secondary | ICD-10-CM | POA: Diagnosis not present

## 2012-02-15 DIAGNOSIS — K5732 Diverticulitis of large intestine without perforation or abscess without bleeding: Secondary | ICD-10-CM

## 2012-02-15 DIAGNOSIS — S31109A Unspecified open wound of abdominal wall, unspecified quadrant without penetration into peritoneal cavity, initial encounter: Secondary | ICD-10-CM

## 2012-02-15 HISTORY — PX: PARTIAL COLECTOMY: SHX5273

## 2012-02-15 LAB — CBC
MCH: 29.6 pg (ref 26.0–34.0)
MCHC: 32.5 g/dL (ref 30.0–36.0)
MCV: 91.2 fL (ref 78.0–100.0)
Platelets: 365 10*3/uL (ref 150–400)

## 2012-02-15 LAB — PREPARE RBC (CROSSMATCH)

## 2012-02-15 LAB — GLUCOSE, CAPILLARY

## 2012-02-15 SURGERY — COLECTOMY, PARTIAL
Anesthesia: General | Site: Abdomen | Wound class: Contaminated

## 2012-02-15 MED ORDER — NALOXONE HCL 0.4 MG/ML IJ SOLN: 0.4000 mg | INTRAMUSCULAR | Status: DC | PRN

## 2012-02-15 MED ORDER — METRONIDAZOLE IN NACL 5-0.79 MG/ML-% IV SOLN
500.0000 mg | INTRAVENOUS | Status: AC
  Administered 2012-02-15: .5 g via INTRAVENOUS

## 2012-02-15 MED ORDER — HYDROCHLOROTHIAZIDE 12.5 MG PO CAPS
12.5000 mg | ORAL_CAPSULE | Freq: Every day | ORAL | Status: DC
  Filled 2012-02-15 (×4): qty 1

## 2012-02-15 MED ORDER — TRAZODONE HCL 100 MG PO TABS
100.0000 mg | ORAL_TABLET | Freq: Every day | ORAL | Status: DC
  Filled 2012-02-15 (×4): qty 1

## 2012-02-15 MED ORDER — ATENOLOL 50 MG PO TABS
50.0000 mg | ORAL_TABLET | Freq: Every morning | ORAL | Status: DC
  Administered 2012-02-17: 50 mg via ORAL
  Filled 2012-02-15 (×3): qty 1

## 2012-02-15 MED ORDER — ACETAMINOPHEN 10 MG/ML IV SOLN
INTRAVENOUS | Status: DC | PRN
  Administered 2012-02-15: 1000 mg via INTRAVENOUS

## 2012-02-15 MED ORDER — POTASSIUM CHLORIDE IN NACL 20-0.9 MEQ/L-% IV SOLN
INTRAVENOUS | Status: DC
  Administered 2012-02-15: 19:00:00 via INTRAVENOUS
  Filled 2012-02-15 (×4): qty 1000

## 2012-02-15 MED ORDER — 0.9 % SODIUM CHLORIDE (POUR BTL) OPTIME
TOPICAL | Status: DC | PRN
  Administered 2012-02-15: 5000 mL

## 2012-02-15 MED ORDER — ONDANSETRON HCL 4 MG/2ML IJ SOLN: 4.0000 mg | Freq: Four times a day (QID) | INTRAMUSCULAR | Status: DC | PRN

## 2012-02-15 MED ORDER — DEXAMETHASONE SODIUM PHOSPHATE 10 MG/ML IJ SOLN
INTRAMUSCULAR | Status: DC | PRN
  Administered 2012-02-15: 10 mg via INTRAVENOUS

## 2012-02-15 MED ORDER — MIDAZOLAM HCL 5 MG/5ML IJ SOLN
INTRAMUSCULAR | Status: DC | PRN
  Administered 2012-02-15: 2 mg via INTRAVENOUS

## 2012-02-15 MED ORDER — ROCURONIUM BROMIDE 100 MG/10ML IV SOLN
INTRAVENOUS | Status: DC | PRN
  Administered 2012-02-15: 10 mg via INTRAVENOUS
  Administered 2012-02-15: 40 mg via INTRAVENOUS

## 2012-02-15 MED ORDER — DIPHENHYDRAMINE HCL 50 MG/ML IJ SOLN: 12.5000 mg | Freq: Four times a day (QID) | INTRAMUSCULAR | Status: DC | PRN

## 2012-02-15 MED ORDER — ALVIMOPAN 12 MG PO CAPS
12.0000 mg | ORAL_CAPSULE | Freq: Once | ORAL | Status: AC
  Administered 2012-02-15: 12 mg via ORAL
  Filled 2012-02-15: qty 1

## 2012-02-15 MED ORDER — LISINOPRIL 40 MG PO TABS
40.0000 mg | ORAL_TABLET | Freq: Every morning | ORAL | Status: DC
  Filled 2012-02-15 (×4): qty 1

## 2012-02-15 MED ORDER — PROPOFOL 10 MG/ML IV EMUL
INTRAVENOUS | Status: DC | PRN
  Administered 2012-02-15: 125 mg via INTRAVENOUS

## 2012-02-15 MED ORDER — FLUOXETINE HCL 20 MG PO CAPS
20.0000 mg | ORAL_CAPSULE | Freq: Every morning | ORAL | Status: DC
  Administered 2012-02-17: 20 mg via ORAL
  Filled 2012-02-15 (×3): qty 1

## 2012-02-15 MED ORDER — ACETAMINOPHEN 10 MG/ML IV SOLN
1000.0000 mg | Freq: Four times a day (QID) | INTRAVENOUS | Status: AC
  Administered 2012-02-15 – 2012-02-16 (×2): 1000 mg via INTRAVENOUS
  Filled 2012-02-15 (×3): qty 100

## 2012-02-15 MED ORDER — HYDROCHLOROTHIAZIDE 25 MG PO TABS: 12.5000 mg | ORAL_TABLET | Freq: Every morning | ORAL | Status: DC

## 2012-02-15 MED ORDER — HYDROMORPHONE HCL PF 1 MG/ML IJ SOLN
0.5000 mg | INTRAMUSCULAR | Status: DC | PRN
  Administered 2012-02-16 – 2012-02-18 (×13): 0.5 mg via INTRAVENOUS
  Filled 2012-02-15 (×9): qty 1

## 2012-02-15 MED ORDER — ALVIMOPAN 12 MG PO CAPS
12.0000 mg | ORAL_CAPSULE | Freq: Two times a day (BID) | ORAL | Status: DC
  Filled 2012-02-15 (×6): qty 1

## 2012-02-15 MED ORDER — NEOSTIGMINE METHYLSULFATE 1 MG/ML IJ SOLN
INTRAMUSCULAR | Status: DC | PRN
  Administered 2012-02-15: 5 mg via INTRAVENOUS

## 2012-02-15 MED ORDER — SODIUM CHLORIDE 0.9 % IV BOLUS (SEPSIS)
1000.0000 mL | Freq: Once | INTRAVENOUS | Status: AC
  Administered 2012-02-15: 1000 mL via INTRAVENOUS

## 2012-02-15 MED ORDER — DIPHENHYDRAMINE HCL 12.5 MG/5ML PO ELIX: 12.5000 mg | ORAL_SOLUTION | Freq: Four times a day (QID) | ORAL | Status: DC | PRN

## 2012-02-15 MED ORDER — GABAPENTIN 300 MG PO CAPS
600.0000 mg | ORAL_CAPSULE | Freq: Three times a day (TID) | ORAL | Status: DC
  Administered 2012-02-17: 600 mg via ORAL
  Filled 2012-02-15 (×11): qty 2

## 2012-02-15 MED ORDER — LACTATED RINGERS IV SOLN: INTRAVENOUS | Status: DC

## 2012-02-15 MED ORDER — PROMETHAZINE HCL 25 MG/ML IJ SOLN: 6.2500 mg | INTRAMUSCULAR | Status: DC | PRN

## 2012-02-15 MED ORDER — ONDANSETRON HCL 4 MG PO TABS: 4.0000 mg | ORAL_TABLET | Freq: Four times a day (QID) | ORAL | Status: DC | PRN

## 2012-02-15 MED ORDER — HYDROMORPHONE 0.3 MG/ML IV SOLN
INTRAVENOUS | Status: DC
  Administered 2012-02-15 (×2): 0.3 mg via INTRAVENOUS
  Administered 2012-02-16: 1.5 mg via INTRAVENOUS
  Administered 2012-02-16: 0.6 mg via INTRAVENOUS

## 2012-02-15 MED ORDER — FENTANYL CITRATE 0.05 MG/ML IJ SOLN
INTRAMUSCULAR | Status: DC | PRN
  Administered 2012-02-15: 50 ug via INTRAVENOUS
  Administered 2012-02-15 (×2): 100 ug via INTRAVENOUS
  Administered 2012-02-15 (×2): 50 ug via INTRAVENOUS
  Administered 2012-02-15: 100 ug via INTRAVENOUS

## 2012-02-15 MED ORDER — METRONIDAZOLE IN NACL 5-0.79 MG/ML-% IV SOLN
500.0000 mg | Freq: Three times a day (TID) | INTRAVENOUS | Status: AC
  Administered 2012-02-15 – 2012-02-16 (×2): 500 mg via INTRAVENOUS
  Filled 2012-02-15 (×3): qty 100

## 2012-02-15 MED ORDER — LIDOCAINE HCL (CARDIAC) 20 MG/ML IV SOLN
INTRAVENOUS | Status: DC | PRN
  Administered 2012-02-15: 100 mg via INTRAVENOUS

## 2012-02-15 MED ORDER — FAMOTIDINE IN NACL 20-0.9 MG/50ML-% IV SOLN
20.0000 mg | Freq: Two times a day (BID) | INTRAVENOUS | Status: DC
  Administered 2012-02-15 – 2012-02-25 (×20): 20 mg via INTRAVENOUS
  Filled 2012-02-15 (×26): qty 50

## 2012-02-15 MED ORDER — FENTANYL CITRATE 0.05 MG/ML IJ SOLN
25.0000 ug | INTRAMUSCULAR | Status: DC | PRN
  Administered 2012-02-15 (×4): 25 ug via INTRAVENOUS
  Administered 2012-02-15: 50 ug via INTRAVENOUS

## 2012-02-15 MED ORDER — ENOXAPARIN SODIUM 30 MG/0.3ML ~~LOC~~ SOLN
30.0000 mg | SUBCUTANEOUS | Status: DC
  Filled 2012-02-15: qty 0.3

## 2012-02-15 MED ORDER — SODIUM CHLORIDE 0.9 % IV BOLUS (SEPSIS)
1000.0000 mL | Freq: Once | INTRAVENOUS | Status: AC
  Administered 2012-02-16: 1000 mL via INTRAVENOUS

## 2012-02-15 MED ORDER — SODIUM CHLORIDE 0.9 % IJ SOLN: 9.0000 mL | INTRAMUSCULAR | Status: DC | PRN

## 2012-02-15 MED ORDER — ONDANSETRON HCL 4 MG/2ML IJ SOLN
INTRAMUSCULAR | Status: DC | PRN
  Administered 2012-02-15: 4 mg via INTRAVENOUS

## 2012-02-15 MED ORDER — LACTATED RINGERS IV SOLN
INTRAVENOUS | Status: DC | PRN
  Administered 2012-02-15 (×3): via INTRAVENOUS

## 2012-02-15 MED ORDER — GLYCOPYRROLATE 0.2 MG/ML IJ SOLN
INTRAMUSCULAR | Status: DC | PRN
  Administered 2012-02-15: .6 mg via INTRAVENOUS

## 2012-02-15 MED ORDER — ACETAMINOPHEN 10 MG/ML IV SOLN: 1000.0000 mg | Freq: Four times a day (QID) | INTRAVENOUS | Status: DC

## 2012-02-15 MED ORDER — HYDRALAZINE HCL 20 MG/ML IJ SOLN
INTRAMUSCULAR | Status: DC | PRN
  Administered 2012-02-15 (×3): 4 mg via INTRAVENOUS

## 2012-02-15 SURGICAL SUPPLY — 52 items
APPLICATOR COTTON TIP 6IN STRL (MISCELLANEOUS) ×4 IMPLANT
BLADE EXTENDED COATED 6.5IN (ELECTRODE) ×2 IMPLANT
BLADE HEX COATED 2.75 (ELECTRODE) ×2 IMPLANT
BLADE SURG SZ10 CARB STEEL (BLADE) IMPLANT
CANISTER SUCTION 2500CC (MISCELLANEOUS) ×2 IMPLANT
CLIP TI LARGE 6 (CLIP) IMPLANT
CLOTH BEACON ORANGE TIMEOUT ST (SAFETY) ×2 IMPLANT
COVER MAYO STAND STRL (DRAPES) ×2 IMPLANT
DRAPE LAPAROSCOPIC ABDOMINAL (DRAPES) ×2 IMPLANT
DRAPE LG THREE QUARTER DISP (DRAPES) ×2 IMPLANT
DRAPE WARM FLUID 44X44 (DRAPE) ×2 IMPLANT
DRSG PAD ABDOMINAL 8X10 ST (GAUZE/BANDAGES/DRESSINGS) ×4 IMPLANT
ELECT REM PT RETURN 9FT ADLT (ELECTROSURGICAL) ×4
ELECTRODE REM PT RTRN 9FT ADLT (ELECTROSURGICAL) ×2 IMPLANT
ENSEAL DEVICE STD TIP 35CM (ENDOMECHANICALS) ×2 IMPLANT
GLOVE BIO SURGEON STRL SZ8 (GLOVE) ×2 IMPLANT
GLOVE BIOGEL PI IND STRL 7.0 (GLOVE) ×3 IMPLANT
GLOVE BIOGEL PI INDICATOR 7.0 (GLOVE) ×3
GLOVE ECLIPSE 6.5 STRL STRAW (GLOVE) ×4 IMPLANT
GLOVE SURG SIGNA 7.5 PF LTX (GLOVE) ×6 IMPLANT
GOWN STRL NON-REIN LRG LVL3 (GOWN DISPOSABLE) ×4 IMPLANT
GOWN STRL REIN XL XLG (GOWN DISPOSABLE) ×4 IMPLANT
HAND ACTIVATED (MISCELLANEOUS) IMPLANT
KIT BASIN OR (CUSTOM PROCEDURE TRAY) ×2 IMPLANT
LEGGING LITHOTOMY PAIR STRL (DRAPES) ×2 IMPLANT
LIGASURE IMPACT 36 18CM CVD LR (INSTRUMENTS) ×2 IMPLANT
NS IRRIG 1000ML POUR BTL (IV SOLUTION) ×4 IMPLANT
PACK GENERAL/GYN (CUSTOM PROCEDURE TRAY) ×2 IMPLANT
RELOAD PROXIMATE 75MM BLUE (ENDOMECHANICALS) ×4 IMPLANT
SHEET LAVH (DRAPES) ×2 IMPLANT
SPONGE GAUZE 4X4 12PLY (GAUZE/BANDAGES/DRESSINGS) ×2 IMPLANT
SPONGE LAP 18X18 X RAY DECT (DISPOSABLE) ×8 IMPLANT
STAPLER GUN LINEAR PROX 60 (STAPLE) ×2 IMPLANT
STAPLER PROXIMATE 75MM BLUE (STAPLE) ×2 IMPLANT
STAPLER VISISTAT 35W (STAPLE) ×2 IMPLANT
SUCTION POOLE TIP (SUCTIONS) ×2 IMPLANT
SUT NOV 1 T60/GS (SUTURE) IMPLANT
SUT NOVA NAB DX-16 0-1 5-0 T12 (SUTURE) IMPLANT
SUT NOVA T20/GS 25 (SUTURE) IMPLANT
SUT PDS AB 1 TP1 96 (SUTURE) ×4 IMPLANT
SUT SILK 2 0 (SUTURE) ×2
SUT SILK 2 0 SH CR/8 (SUTURE) IMPLANT
SUT SILK 2 0SH CR/8 30 (SUTURE) ×4 IMPLANT
SUT SILK 2-0 18XBRD TIE 12 (SUTURE) ×1 IMPLANT
SUT SILK 2-0 30XBRD TIE 12 (SUTURE) IMPLANT
SUT SILK 3 0 (SUTURE) ×4
SUT SILK 3 0 SH CR/8 (SUTURE) ×2 IMPLANT
SUT SILK 3-0 18XBRD TIE 12 (SUTURE) ×2 IMPLANT
TOWEL OR 17X26 10 PK STRL BLUE (TOWEL DISPOSABLE) ×4 IMPLANT
TRAY FOLEY CATH 14FRSI W/METER (CATHETERS) ×2 IMPLANT
WATER STERILE IRR 1500ML POUR (IV SOLUTION) IMPLANT
YANKAUER SUCT BULB TIP NO VENT (SUCTIONS) ×2 IMPLANT

## 2012-02-15 NOTE — Progress Notes (Signed)
Patient ID: [name], female   DOB: 07-10-1950, 61 y.o.   MRN: [id number]  Pt has had multiple bloody BM's with clot post op.  Remains hemodynamically stable.  Hgb 9.8 down from 11.4 Will transfuse PRBC's and FFP Suspect anastomotic staple line.  Hopefully will stop without need for reexploration

## 2012-02-15 NOTE — Interval H&P Note (Signed)
History and Physical Interval Note:  No change in H and P  02/15/2012 8:10 AM  [name]  has presented today for surgery, with the diagnosis of lower gastrointestinal bleed  The various methods of treatment have been discussed with the patient and family. After consideration of risks, benefits and other options for treatment, the patient has consented to  Procedure(s) (LRB) with comments: PARTIAL COLECTOMY (N/A) as a surgical intervention .  The patient's history has been reviewed, patient examined, no change in status, stable for surgery.  I have reviewed the patient's chart and labs.  Questions were answered to the patient's satisfaction.     Nasir Bright A

## 2012-02-15 NOTE — Anesthesia Preprocedure Evaluation (Addendum)
Anesthesia Evaluation  Patient identified by MRN, date of birth, ID band Patient awake    Reviewed: Allergy & Precautions, H&P , NPO status , Patient's Chart, lab work & pertinent test results  Airway Mallampati: II TM Distance: >3 FB Neck ROM: Full    Dental No notable dental hx.    Pulmonary shortness of breath, Current Smoker,  breath sounds clear to auscultation  Pulmonary exam normal       Cardiovascular hypertension, Pt. on home beta blockers + Peripheral Vascular Disease Rhythm:Regular Rate:Normal     Neuro/Psych PSYCHIATRIC DISORDERS Depression CVA    GI/Hepatic GERD-  Medicated,(+) Hepatitis -  Endo/Other  Morbid obesity  Renal/GU negative Renal ROS  negative genitourinary   Musculoskeletal negative musculoskeletal ROS (+)   Abdominal   Peds negative pediatric ROS (+)  Hematology negative hematology ROS (+)   Anesthesia Other Findings   Reproductive/Obstetrics negative OB ROS                           Anesthesia Physical Anesthesia Plan  ASA: III  Anesthesia Plan: General   Post-op Pain Management:    Induction: Intravenous  Airway Management Planned: Oral ETT  Additional Equipment:   Intra-op Plan:   Post-operative Plan: Extubation in OR  Informed Consent: I have reviewed the patients History and Physical, chart, labs and discussed the procedure including the risks, benefits and alternatives for the proposed anesthesia with the patient or authorized representative who has indicated his/her understanding and acceptance.   Dental advisory given  Plan Discussed with: CRNA  Anesthesia Plan Comments:        Anesthesia Quick Evaluation

## 2012-02-15 NOTE — Transfer of Care (Signed)
Immediate Anesthesia Transfer of Care Note  Patient: [name]  Procedure(s) Performed: Procedure(s) (LRB) with comments: PARTIAL COLECTOMY (N/A)  Patient Location: PACU  Anesthesia Type: General  Level of Consciousness: awake, alert  and oriented  Airway & Oxygen Therapy: Patient Spontanous Breathing and Patient connected to face mask oxygen  Post-op Assessment: Report given to PACU RN and Post -op Vital signs reviewed and stable  Post vital signs: Reviewed and stable  Complications: No apparent anesthesia complications

## 2012-02-15 NOTE — Anesthesia Postprocedure Evaluation (Signed)
  Anesthesia Post-op Note  Patient: [name]  Procedure(s) Performed: Procedure(s) (LRB): PARTIAL COLECTOMY (N/A)  Patient Location: PACU  Anesthesia Type: General  Level of Consciousness: awake and alert   Airway and Oxygen Therapy: Patient Spontanous Breathing  Post-op Pain: mild  Post-op Assessment: Post-op Vital signs reviewed, Patient's Cardiovascular Status Stable, Respiratory Function Stable, Patent Airway and No signs of Nausea or vomiting  Post-op Vital Signs: stable  Complications: No apparent anesthesia complications

## 2012-02-15 NOTE — Progress Notes (Signed)
Pt arrived to unit from PACU.  Nurse had reported pt having bloody discharge from rectum with clots.  Pt noted having multiple episodes of bloody discharge with clots from rectum while on the unit.  MD notified and ordered 2 units of PRBC's and 2 units of FFP. While getting vitals for blood administration, pt BP manually was 78/50.  MD notified of dropping BP and rapid response nurse called.  MD arrived to unit and wrote new orders, pt transferred to ICU.

## 2012-02-15 NOTE — Progress Notes (Signed)
Bloody BM with clot about lemon sized.

## 2012-02-15 NOTE — Preoperative (Signed)
Beta Blockers   Reason not to administer Beta Blockers:pt ttok atenolol this am

## 2012-02-15 NOTE — Op Note (Signed)
PARTIAL COLECTOMY  Procedure Note  [name] 02/15/2012   Pre-op Diagnosis: Lower gastrointestinal bleed     Post-op Diagnosis: same  Procedure(s): LEFT PARTIAL COLECTOMY TAKEDOWN OF SPLENIC FLEXURE  Surgeon(s): Shelly Rubenstein, MD Valarie Merino, MD  Anesthesia: General  Staff:  Raliegh Scarlet, RN - Circulator Pattie Gust Brooms, RN - Circulator Assistant Tammy Gordy Levan, CST - Scrub Person  Estimated Blood Loss: 200 mL               Specimens: left colon          [name]   Date: 02/15/2012  Time: 10:56 AM

## 2012-02-16 ENCOUNTER — Encounter (HOSPITAL_COMMUNITY): Payer: Self-pay | Admitting: Anesthesiology

## 2012-02-16 ENCOUNTER — Inpatient Hospital Stay (HOSPITAL_COMMUNITY): Payer: Medicare Other | Admitting: Anesthesiology

## 2012-02-16 ENCOUNTER — Encounter (HOSPITAL_COMMUNITY)
Admission: RE | Disposition: A | Discharge status: Home / Self Care | Payer: Self-pay | Source: Ambulatory Visit | Attending: Surgery

## 2012-02-16 DIAGNOSIS — IMO0002 Reserved for concepts with insufficient information to code with codable children: Secondary | ICD-10-CM

## 2012-02-16 HISTORY — PX: LAPAROTOMY: SHX154

## 2012-02-16 LAB — PROTIME-INR
INR: 1.1 (ref 0.00–1.49)
Prothrombin Time: 14.1 seconds (ref 11.6–15.2)

## 2012-02-16 LAB — CBC
HCT: 25 % — ABNORMAL LOW (ref 36.0–46.0)
Hemoglobin: 8.6 g/dL — ABNORMAL LOW (ref 12.0–15.0)
MCH: 30.3 pg (ref 26.0–34.0)
MCHC: 34.4 g/dL (ref 30.0–36.0)
MCV: 88 fL (ref 78.0–100.0)
Platelets: 168 10*3/uL (ref 150–400)
RBC: 2.84 MIL/uL — ABNORMAL LOW (ref 3.87–5.11)
RDW: 15.4 % (ref 11.5–15.5)
WBC: 13.1 10*3/uL — ABNORMAL HIGH (ref 4.0–10.5)

## 2012-02-16 LAB — BASIC METABOLIC PANEL
BUN: 13 mg/dL (ref 6–23)
CO2: 24 mEq/L (ref 19–32)
Glucose, Bld: 216 mg/dL — ABNORMAL HIGH (ref 70–99)
Potassium: 4.2 mEq/L (ref 3.5–5.1)
Sodium: 137 mEq/L (ref 135–145)

## 2012-02-16 LAB — PREPARE RBC (CROSSMATCH)

## 2012-02-16 LAB — HEMOGLOBIN AND HEMATOCRIT, BLOOD
HCT: 29.1 % — ABNORMAL LOW (ref 36.0–46.0)
Hemoglobin: 10.8 g/dL — ABNORMAL LOW (ref 12.0–15.0)
Hemoglobin: 10.3 g/dL — ABNORMAL LOW (ref 12.0–15.0)

## 2012-02-16 SURGERY — LAPAROTOMY, EXPLORATORY
Anesthesia: General | Site: Abdomen | Wound class: Clean Contaminated

## 2012-02-16 MED ORDER — LIDOCAINE HCL (CARDIAC) 20 MG/ML IV SOLN
INTRAVENOUS | Status: DC | PRN
  Administered 2012-02-16: 50 mg via INTRAVENOUS

## 2012-02-16 MED ORDER — FENTANYL CITRATE 0.05 MG/ML IJ SOLN
INTRAMUSCULAR | Status: DC | PRN
  Administered 2012-02-16 (×2): 50 ug via INTRAVENOUS

## 2012-02-16 MED ORDER — PROMETHAZINE HCL 25 MG/ML IJ SOLN: 6.2500 mg | INTRAMUSCULAR | Status: DC | PRN

## 2012-02-16 MED ORDER — NEOSTIGMINE METHYLSULFATE 1 MG/ML IJ SOLN
INTRAMUSCULAR | Status: DC | PRN
  Administered 2012-02-16: 4 mg via INTRAVENOUS

## 2012-02-16 MED ORDER — GLYCOPYRROLATE 0.2 MG/ML IJ SOLN
INTRAMUSCULAR | Status: DC | PRN
  Administered 2012-02-16: .8 mg via INTRAVENOUS

## 2012-02-16 MED ORDER — LACTATED RINGERS IV SOLN
INTRAVENOUS | Status: DC | PRN
  Administered 2012-02-16: 07:00:00 via INTRAVENOUS

## 2012-02-16 MED ORDER — ONDANSETRON HCL 4 MG/2ML IJ SOLN
INTRAMUSCULAR | Status: DC | PRN
  Administered 2012-02-16: 4 mg via INTRAVENOUS

## 2012-02-16 MED ORDER — PROPOFOL 10 MG/ML IV BOLUS
INTRAVENOUS | Status: DC | PRN
  Administered 2012-02-16: 60 mg via INTRAVENOUS

## 2012-02-16 MED ORDER — PHENYLEPHRINE HCL 10 MG/ML IJ SOLN
INTRAMUSCULAR | Status: DC | PRN
  Administered 2012-02-16: 40 ug via INTRAVENOUS

## 2012-02-16 MED ORDER — INFLUENZA VIRUS VACC SPLIT PF IM SUSP
0.5000 mL | INTRAMUSCULAR | Status: AC
  Administered 2012-02-17: 0.5 mL via INTRAMUSCULAR
  Filled 2012-02-16: qty 0.5

## 2012-02-16 MED ORDER — CALCIUM CHLORIDE 10 % IV SOLN
INTRAVENOUS | Status: DC | PRN
  Administered 2012-02-16: 1 g via INTRAVENOUS

## 2012-02-16 MED ORDER — POTASSIUM CHLORIDE IN NACL 20-0.9 MEQ/L-% IV SOLN
INTRAVENOUS | Status: DC
  Administered 2012-02-16 – 2012-02-17 (×4): via INTRAVENOUS
  Administered 2012-02-18: 100 mL/h via INTRAVENOUS
  Administered 2012-02-19 – 2012-02-20 (×3): via INTRAVENOUS
  Filled 2012-02-16 (×16): qty 1000

## 2012-02-16 MED ORDER — LACTATED RINGERS IV SOLN: INTRAVENOUS | Status: DC

## 2012-02-16 MED ORDER — HYDROMORPHONE HCL PF 1 MG/ML IJ SOLN
0.5000 mg | Freq: Once | INTRAMUSCULAR | Status: DC
  Filled 2012-02-16: qty 1

## 2012-02-16 MED ORDER — ROCURONIUM BROMIDE 100 MG/10ML IV SOLN
INTRAVENOUS | Status: DC | PRN
  Administered 2012-02-16: 40 mg via INTRAVENOUS
  Administered 2012-02-16: 10 mg via INTRAVENOUS

## 2012-02-16 MED ORDER — PHENYLEPHRINE HCL 10 MG/ML IJ SOLN
10.0000 mg | INTRAMUSCULAR | Status: DC | PRN
  Administered 2012-02-16: 40 ug/min via INTRAVENOUS

## 2012-02-16 MED ORDER — 0.9 % SODIUM CHLORIDE (POUR BTL) OPTIME
TOPICAL | Status: DC | PRN
  Administered 2012-02-16 (×2): 1000 mL

## 2012-02-16 MED ORDER — CALCIUM CHLORIDE 10 % IV SOLN
1.0000 g | Freq: Once | INTRAVENOUS | Status: DC
  Filled 2012-02-16: qty 10

## 2012-02-16 MED ORDER — HYDROMORPHONE HCL PF 1 MG/ML IJ SOLN: 0.2500 mg | INTRAMUSCULAR | Status: DC | PRN

## 2012-02-16 MED ORDER — DEXTROSE 5 % IV SOLN
500.0000 mg | Freq: Once | INTRAVENOUS | Status: AC
  Administered 2012-02-16: 500 mg via INTRAVENOUS
  Filled 2012-02-16: qty 0.5

## 2012-02-16 MED ORDER — SUCCINYLCHOLINE CHLORIDE 20 MG/ML IJ SOLN
INTRAMUSCULAR | Status: DC | PRN
  Administered 2012-02-16: 80 mg via INTRAVENOUS

## 2012-02-16 SURGICAL SUPPLY — 42 items
APPLICATOR COTTON TIP 6IN STRL (MISCELLANEOUS) ×2 IMPLANT
BLADE EXTENDED COATED 6.5IN (ELECTRODE) ×2 IMPLANT
BLADE HEX COATED 2.75 (ELECTRODE) ×2 IMPLANT
BNDG COHESIVE 4X5 TAN STRL (GAUZE/BANDAGES/DRESSINGS) ×2 IMPLANT
CANISTER SUCTION 2500CC (MISCELLANEOUS) ×4 IMPLANT
CLOTH BEACON ORANGE TIMEOUT ST (SAFETY) ×2 IMPLANT
COVER MAYO STAND STRL (DRAPES) ×2 IMPLANT
DRAPE LAPAROSCOPIC ABDOMINAL (DRAPES) ×2 IMPLANT
DRAPE WARM FLUID 44X44 (DRAPE) ×2 IMPLANT
DRESSING TELFA 8X3 (GAUZE/BANDAGES/DRESSINGS) ×2 IMPLANT
DRSG PAD ABDOMINAL 8X10 ST (GAUZE/BANDAGES/DRESSINGS) ×2 IMPLANT
ELECT REM PT RETURN 9FT ADLT (ELECTROSURGICAL) ×2
ELECTRODE REM PT RTRN 9FT ADLT (ELECTROSURGICAL) ×1 IMPLANT
GLOVE BIOGEL PI IND STRL 7.0 (GLOVE) ×1 IMPLANT
GLOVE BIOGEL PI INDICATOR 7.0 (GLOVE) ×1
GOWN STRL NON-REIN LRG LVL3 (GOWN DISPOSABLE) ×2 IMPLANT
GOWN STRL REIN XL XLG (GOWN DISPOSABLE) ×10 IMPLANT
KIT BASIN OR (CUSTOM PROCEDURE TRAY) ×2 IMPLANT
LEGGING LITHOTOMY PAIR STRL (DRAPES) ×2 IMPLANT
NS IRRIG 1000ML POUR BTL (IV SOLUTION) ×6 IMPLANT
PACK GENERAL/GYN (CUSTOM PROCEDURE TRAY) ×2 IMPLANT
POSITIONER SURGICAL ARM (MISCELLANEOUS) ×2 IMPLANT
REMOVER STAPLE SKIN (DISPOSABLE) ×2 IMPLANT
SOL PREP PROV IODINE SCRUB 4OZ (MISCELLANEOUS) ×2 IMPLANT
SPONGE GAUZE 4X4 12PLY (GAUZE/BANDAGES/DRESSINGS) ×2 IMPLANT
SPONGE LAP 18X18 X RAY DECT (DISPOSABLE) ×2 IMPLANT
STAPLER VISISTAT 35W (STAPLE) ×2 IMPLANT
SUCTION POOLE TIP (SUCTIONS) IMPLANT
SUT PDS AB 1 CTX 36 (SUTURE) IMPLANT
SUT PDS AB 1 TP1 96 (SUTURE) ×2 IMPLANT
SUT SILK 2 0 (SUTURE) ×2
SUT SILK 2 0 SH CR/8 (SUTURE) ×2 IMPLANT
SUT SILK 2-0 18XBRD TIE 12 (SUTURE) ×1 IMPLANT
SUT SILK 3 0 (SUTURE) ×2
SUT SILK 3 0 SH CR/8 (SUTURE) ×2 IMPLANT
SUT SILK 3-0 18XBRD TIE 12 (SUTURE) ×1 IMPLANT
SUT VIC AB 2-0 SH 27 (SUTURE) ×4
SUT VIC AB 2-0 SH 27X BRD (SUTURE) ×2 IMPLANT
TAPE CLOTH SURG 4X10 WHT LF (GAUZE/BANDAGES/DRESSINGS) ×2 IMPLANT
TOWEL OR 17X26 10 PK STRL BLUE (TOWEL DISPOSABLE) ×4 IMPLANT
TRAY FOLEY CATH 14FRSI W/METER (CATHETERS) IMPLANT
YANKAUER SUCT BULB TIP NO VENT (SUCTIONS) IMPLANT

## 2012-02-16 NOTE — Anesthesia Preprocedure Evaluation (Signed)
Anesthesia Evaluation  Patient identified by MRN, date of birth, ID band Patient awake    Reviewed: Allergy & Precautions, H&P , NPO status , Patient's Chart, lab work & pertinent test results  Airway Mallampati: II TM Distance: <3 FB Neck ROM: Full    Dental No notable dental hx. (+) Dental Advisory Given   Pulmonary neg pulmonary ROS,  breath sounds clear to auscultation  Pulmonary exam normal       Cardiovascular hypertension, + Peripheral Vascular Disease Rhythm:Regular Rate:Normal     Neuro/Psych CVA negative psych ROS   GI/Hepatic negative GI ROS, (+) Hepatitis -, C  Endo/Other  Morbid obesity  Renal/GU negative Renal ROS  negative genitourinary   Musculoskeletal negative musculoskeletal ROS (+)   Abdominal   Peds negative pediatric ROS (+)  Hematology negative hematology ROS (+)   Anesthesia Other Findings   Reproductive/Obstetrics negative OB ROS                           Anesthesia Physical Anesthesia Plan  ASA: III and Emergent  Anesthesia Plan: General   Post-op Pain Management:    Induction: Intravenous and Rapid sequence  Airway Management Planned: Oral ETT  Additional Equipment:   Intra-op Plan:   Post-operative Plan: Possible Post-op intubation/ventilation  Informed Consent: I have reviewed the patients History and Physical, chart, labs and discussed the procedure including the risks, benefits and alternatives for the proposed anesthesia with the patient or authorized representative who has indicated his/her understanding and acceptance.   Dental advisory given  Plan Discussed with: CRNA and Surgeon  Anesthesia Plan Comments:         Anesthesia Quick Evaluation

## 2012-02-16 NOTE — Transfer of Care (Deleted)
Immediate Anesthesia Transfer of Care Note  Patient: [name]  Procedure(s) Performed: Procedure(s) (LRB): EXPLORATORY LAPAROTOMY (N/A)  Patient Location: PACU  Anesthesia Type: General  Level of Consciousness: sedated, patient cooperative and responds to stimulaton  Airway & Oxygen Therapy: Patient Spontanous Breathing and Patient connected to face mask oxgen  Post-op Assessment: Report given to PACU RN and Post -op Vital signs reviewed and stable  Post vital signs: Reviewed and stable  Complications: No apparent anesthesia complications

## 2012-02-16 NOTE — Op Note (Signed)
Preoperative Diagnosis: lower gastrointestinal bleed post operataive bleeding following partial colectomy  Postoprative Diagnosis: lower gastrointestinal bleed post operataive bleeding following partial colectomy  Procedure: Procedure(s): EXPLORATORY LAPAROTOMY AND OVERSEWING OF COLONIC ANASTAMOSIS, RIGID SIGMOIDOSCOPY   Surgeon: Glenna Fellows T   Assistants: Lodema Pilot   Anesthesia:  General endotracheal anesthesiaDiagnos  Indications:   Patient is a 61 year old female with multiple medical problems and history of recurrent lower GI bleeding localized to the left colon. Yesterday she underwent an elective left colectomy with primary anastomosis. Overnight she has had persistent rectal bleeding and clots. Her vital signs have been stable but she has required 6 units of packed cells with still some decrease in her hemoglobin in early this morning is still passing large clots per rectum. I therefore recommended return to the operating room for sigmoidoscopy and laparotomy with control of bleeding. She understands and agrees.  Procedure Detail:  Patient was brought to the operating room, placed in the supine position on the operating table, and general endotracheal anesthesia was induced. She was placed in lithotomy position. Initially I performed a rigid sigmoidoscopy. The rectum contained clots and fresh blood. This was suctioned and at about 19 cm I could visualize the anastomosis and there was an obvious active bleeding point at the staple line of the anastomosis on the right side. I did not feel there was any reliable way to control this transanally. The sigmoidoscope was removed and the abdomen was widely sterilely prepped and draped. Patient timeout was performed the correct procedure verified. Skin staples were removed and the fascial suture removed and the abdomen opened. There was minimal blood in the peritoneal cavity. The viscera were packed in the upper abdomen a Balfour placed  with good exposure of the anastomosis. It grossly appeared normal. We identified the staple line and beginning posteriorly and working up the right side the staple line was oversewn with running 2-0 Vicryl. The tail from the start of the suture was then brought underneath the bowel to make sure we had the entire staple line and then beginning here and working up the left side of the staple line and back to the original suture the entire staple line was oversewn. I then went back below and performed rigid sigmoidoscopy while the bowel was occluded above the anastomosis. Again the anastomosis was visualized and I did not see bleeding. It was airtight under saline irrigation. Following this the abdomen was thoroughly irrigated and hemostasis assured. The midline fascia was closed after returning viscera to anatomic position with running #1 looped PDS begun the dura the incision and tied centrally. Subcutaneous tissue was irrigated. The skin was closed with staples with Teflon wicks. Sponge needle and instrument counts were correct.  Findings: As above  Estimated Blood Loss:  200 mL         Drains: none  Blood Given: 250 CC PRBC          Specimens: none        Complications:  * No complications entered in OR log *         Disposition: PACU intubated and stable         Condition: stable  Mariella Saa MD, FACS  02/16/2012, 9:07 AM

## 2012-02-16 NOTE — Progress Notes (Signed)
1 Day Post-Op  Subjective: Some expected pain but no other C/O Has had repeated bloody stools with large clots throughout the night Objective: Vital signs in last 24 hours: Temp:  [97.3 F (36.3 C)-98.6 F (37 C)] 97.8 F (36.6 C) (09/21 0515) Pulse Rate:  [63-100] 86  (09/21 0530) Resp:  [9-23] 12  (09/21 0530) BP: (73-186)/(34-124) 90/64 mmHg (09/21 0530) SpO2:  [92 %-98 %] 93 % (09/21 0000) Last BM Date: 02/15/12  Intake/Output from previous day: 09/20 0701 - 09/21 0700 In: 5560 [I.V.:3745; Blood:1815] Out: 940 [Urine:790; Blood:150] Intake/Output this shift: Total I/O In: 2740 [I.V.:925; Blood:1815] Out: 165 [Urine:165]  General appearance: alert, fatigued and morbidly obese GI: Non distended, mild incisional tenderness Fresh clots at rectum  Lab Results:   Basename 02/16/12 0300 02/15/12 1318  WBC 13.1* 17.9*  HGB 8.6* 9.8*  HCT 25.0* 30.2*  PLT 168 365   BMET  Basename 02/16/12 0300 02/13/12 1020  NA 137 139  K 4.2 3.3*  CL 103 102  CO2 24 29  GLUCOSE 216* 106*  BUN 13 7  CREATININE 1.06 0.64  CALCIUM 7.0* 8.6   PT/INR  Basename 02/16/12 0515  LABPROT 14.1  INR 1.10   ABG No results found for this basename: PHART:2,PCO2:2,PO2:2,HCO3:2 in the last 72 hours  Studies/Results: No results found.  Anti-infectives: Anti-infectives     Start     Dose/Rate Route Frequency Ordered Stop   02/15/12 1700   metroNIDAZOLE (FLAGYL) IVPB 500 mg        500 mg 100 mL/hr over 60 Minutes Intravenous Every 8 hours 02/15/12 1335 02/16/12 0859   02/15/12 0744   metroNIDAZOLE (FLAGYL) IVPB 500 mg        500 mg 100 mL/hr over 60 Minutes Intravenous 60 min pre-op 02/15/12 0744 02/15/12 0920          Assessment/Plan: s/p Procedure(s) (LRB) with comments: PARTIAL COLECTOMY (N/A) Ongoing post op bleeding.  VS have been stable but has received 4 units of PRBCs with decreasing Hbg and continued clinical bleeding Will need to return to OR to control bleeding.   Discussed with pt who agrees.  Tried multiple numbers for family members with NA  LOS: 1 day    [name] T 02/16/2012

## 2012-02-16 NOTE — Op Note (Signed)
[name], [name] NO.:  [id number]  MEDICAL RECORD NO.:  [id number]  LOCATION:  1230                         FACILITY:  [facility]  PHYSICIAN:  Abigail Miyamoto, M.D. DATE OF BIRTH:  Jun 13, 1950  DATE OF PROCEDURE:  02/15/2012 DATE OF DISCHARGE:                              OPERATIVE REPORT   PREOPERATIVE DIAGNOSIS:  Chronic lower gastrointestinal bleeding.  POSTOPERATIVE DIAGNOSIS:  Chronic lower gastrointestinal bleeding.  PROCEDURE: 1. Left partial colectomy. 2. Take down of the splenic flexure.  SURGEON:  Abigail Miyamoto, M.D.  ASSISTANT:  Thornton Park. Daphine Deutscher, MD  ANESTHESIA:  General endotracheal anesthesia.  ESTIMATED BLOOD LOSS:  200 mL.  INDICATIONS:  This is a 61 year old female with multiple chronic medical problems.  She also has a history of GI bleed from both upper and lower GI sources.  She has had small bowel endoscopy for ablation of small bowel AVMs.  She has also had lower GI bleeds from left colon diverticuli.  Because of her chronic need for transfusions, no other medical issues, the patient has requested a left partial colectomy.  FINDINGS:  The patient was found to have small amount of diverticuli in the left colon.  There was one area that was quite inflamed with a larger diverticuli in the posterior wall of the descending colon.  The left colon was removed and sent to Pathology for evaluation.  There was no other intra-abdominal pathology identified.  PROCEDURE IN DETAIL:  The patient was brought to the operating room identified as [name].  She was placed supine on the operating table and general anesthesia was induced.  The patient was then placed in the lithotomy position.  Her abdomen was then prepped and draped in usual sterile fashion.  I made a midline incision with a scalpel and took this down through the fascia with electrocautery.  The peritoneum was then opened the entire length of the incision.  The patient had  some adhesions of small bowel and omentum to the abdominal wall, which I took down with both Metzenbaum scissors and electrocautery.  The sigmoid colon was then identified along the left colon.  I took down several adhesions to the pelvic sidewall with the cautery and then mobilized the entire left colon along the white line of Toldt.  I identified the left ureter easily in the retroperitoneum.  There was an area on the middescending colon that was quite firm and inflamed for about a 2-cm segment going into the retroperitoneum.  I was able to free this up with dissection with electrocautery.  I then took down the splenic flexure with electrocautery as well.  The patient had only a small amount of diverticuli in the left colon.  I transected the distal colon just proximal to the rectum with a GIA-75 stapler.  I then transected an area of the most distal transverse colon with a GIA-75 stapler.  I then took down the mesentery with the LigaSure device.  Once the colon was completely removed, it was sent to Pathology for evaluation.  I then reapproximated the proximal colon to the distal colon rectum with interrupted silk sutures.  I created a colotomy on both ends and performed a side-to-side  anastomosis with a single firing of GIA-75 stapler.  I then closed the opening with a TA-60 stapler.  I then reinforced the staple line with silk sutures.  A wide well-perfused anastomosis was appeared to be achieved.  I then ran the small bowel from ligament of Treitz to the terminal ileum and found no other abnormalities.  There were also other abnormalities in the colon.  At this point, I thoroughly irrigated the abdomen with several liters of normal saline.  Hemostasis appeared to be achieved.  I then closed the patient's midline fascia with running #1 looped PDS suture.  The skin was then irrigated and closed with skin staples.  The patient tolerated the procedure well.  All the counts were correct  at the end of procedure.  The patient was then extubated in the operating room and taken in a stable condition to the recovery room.     Abigail Miyamoto, M.D.     DB/MEDQ  D:  02/15/2012  T:  02/16/2012  Job:  [id number]  cc:   [name], MD Fax: [phone]  Samul Dada, M.D. Fax: [phone]

## 2012-02-16 NOTE — Anesthesia Postprocedure Evaluation (Signed)
  Anesthesia Post-op Note  Patient: [name]  Procedure(s) Performed: Procedure(s) (LRB): EXPLORATORY LAPAROTOMY (N/A)  Patient Location: PACU  Anesthesia Type: General  Level of Consciousness: sedated  Airway and Oxygen Therapy: PSV  Post-op Pain: mild  Post-op Assessment: Post-op Vital signs reviewed, Patient's Cardiovascular Status Stable, Respiratory Function Stable, Patent Airway and No signs of Nausea or vomiting  Post-op Vital Signs: stable  Complications: No apparent anesthesia complications, to ICU intubated on PSV, CCM will follow to extubate

## 2012-02-16 NOTE — Transfer of Care (Signed)
Immediate Anesthesia Transfer of Care Note  Patient: [name]  Procedure(s) Performed: Procedure(s) (LRB) with comments: EXPLORATORY LAPAROTOMY (N/A) - oversewing of anastomotic leak and rigid sigmoidoscopy  Patient Location: PACU  Anesthesia Type: General  Level of Consciousness: sedated  Airway & Oxygen Therapy: Patient Spontanous Breathing and Patient connected to T-piece oxygen  Post-op Assessment: Report given to PACU RN, Post -op Vital signs reviewed and stable and Patient moving all extremities  Post vital signs: Reviewed and stable  Complications: No apparent anesthesia complications

## 2012-02-17 ENCOUNTER — Inpatient Hospital Stay (HOSPITAL_COMMUNITY): Payer: Medicare Other

## 2012-02-17 LAB — BLOOD GAS, ARTERIAL
Bicarbonate: 24.4 mEq/L — ABNORMAL HIGH (ref 20.0–24.0)
O2 Content: 2 L/min
O2 Saturation: 95 %
pCO2 arterial: 48.7 mmHg — ABNORMAL HIGH (ref 35.0–45.0)
pH, Arterial: 7.321 — ABNORMAL LOW (ref 7.350–7.450)
pO2, Arterial: 74.6 mmHg — ABNORMAL LOW (ref 80.0–100.0)

## 2012-02-17 LAB — PREPARE FRESH FROZEN PLASMA
Unit division: 0
Unit division: 0

## 2012-02-17 LAB — CBC
MCH: 30.8 pg (ref 26.0–34.0)
MCV: 89.6 fL (ref 78.0–100.0)
Platelets: 110 10*3/uL — ABNORMAL LOW (ref 150–400)
RBC: 2.89 MIL/uL — ABNORMAL LOW (ref 3.87–5.11)
RDW: 15.8 % — ABNORMAL HIGH (ref 11.5–15.5)

## 2012-02-17 LAB — TROPONIN I: Troponin I: <0.3 ng/mL (ref <0.30)

## 2012-02-17 LAB — BASIC METABOLIC PANEL
CO2: 25 mEq/L (ref 19–32)
Calcium: 7.4 mg/dL — ABNORMAL LOW (ref 8.4–10.5)
Creatinine, Ser: 0.74 mg/dL (ref 0.50–1.10)

## 2012-02-17 LAB — AMMONIA: Ammonia: 45 umol/L (ref 11–60)

## 2012-02-17 LAB — HEMOGLOBIN AND HEMATOCRIT, BLOOD: Hemoglobin: 8.8 g/dL — ABNORMAL LOW (ref 12.0–15.0)

## 2012-02-17 MED ORDER — METOPROLOL TARTRATE 1 MG/ML IV SOLN
5.0000 mg | Freq: Four times a day (QID) | INTRAVENOUS | Status: DC | PRN
  Administered 2012-02-17: 5 mg via INTRAVENOUS
  Filled 2012-02-17 (×2): qty 5

## 2012-02-17 MED ORDER — LORAZEPAM 2 MG/ML IJ SOLN
1.0000 mg | Freq: Four times a day (QID) | INTRAMUSCULAR | Status: DC | PRN
  Administered 2012-02-19 (×2): 1 mg via INTRAVENOUS
  Filled 2012-02-17 (×2): qty 1

## 2012-02-17 MED ORDER — HYDRALAZINE HCL 20 MG/ML IJ SOLN
10.0000 mg | INTRAMUSCULAR | Status: DC | PRN
  Administered 2012-02-17 – 2012-02-18 (×3): 20 mg via INTRAVENOUS
  Administered 2012-02-18 – 2012-02-19 (×2): 40 mg via INTRAVENOUS
  Administered 2012-02-19 – 2012-02-20 (×4): 20 mg via INTRAVENOUS
  Filled 2012-02-17 (×4): qty 1
  Filled 2012-02-17: qty 2
  Filled 2012-02-17: qty 1
  Filled 2012-02-17: qty 2
  Filled 2012-02-17 (×3): qty 1

## 2012-02-17 MED ORDER — METOPROLOL TARTRATE 1 MG/ML IV SOLN
5.0000 mg | Freq: Once | INTRAVENOUS | Status: AC
  Administered 2012-02-17: 5 mg via INTRAVENOUS

## 2012-02-17 MED ORDER — METOPROLOL TARTRATE 1 MG/ML IV SOLN
10.0000 mg | Freq: Four times a day (QID) | INTRAVENOUS | Status: DC | PRN
  Administered 2012-02-17 – 2012-02-18 (×4): 10 mg via INTRAVENOUS
  Filled 2012-02-17: qty 5
  Filled 2012-02-17 (×2): qty 10
  Filled 2012-02-17: qty 5
  Filled 2012-02-17: qty 10
  Filled 2012-02-17: qty 5

## 2012-02-17 NOTE — Progress Notes (Signed)
1 Day Post-Op  Subjective: Patient extubated yesterday after her procedure.  He sats have been okay but the patient has either been resting comfortably or delirious.  Nurse reports no further rectal bleeding  Objective: Vital signs in last 24 hours: Temp:  [97.2 F (36.2 C)-98.9 F (37.2 C)] 98.5 F (36.9 C) (09/22 0000) Pulse Rate:  [86-118] 106  (09/22 0600) Resp:  [3-34] 15  (09/22 0600) BP: (126-189)/(54-99) 158/83 mmHg (09/22 0600) SpO2:  [88 %-100 %] 98 % (09/22 0600) FiO2 (%):  [40 %-100 %] 40 % (09/21 1350) Last BM Date: 02/16/12  Intake/Output from previous day: 09/21 0701 - 09/22 0700 In: 4213.3 [I.V.:2863.3; Blood:350; IV Piggyback:1000] Out: 1125 [Urine:1025; Blood:100] Intake/Output this shift:    General appearance: delirious, no distress and uncooperative Resp: clear to auscultation bilaterally Cardio: tachycardic, regular GI: soft, difficult exam due to delirium, ND, dressing clean and dry Extremities: hand mittens  Lab Results:   Basename 02/17/12 0526 02/16/12 2350 02/16/12 0300  WBC 11.6* -- 13.1*  HGB 8.9* 9.6* --  HCT 25.9* 28.0* --  PLT 110* -- 168   BMET  Basename 02/17/12 0526 02/16/12 0300  NA 141 137  K 4.0 4.2  CL 110 103  CO2 25 24  GLUCOSE 151* 216*  BUN 13 13  CREATININE 0.74 1.06  CALCIUM 7.4* 7.0*   PT/INR  Basename 02/16/12 0515  LABPROT 14.1  INR 1.10   ABG No results found for this basename: PHART:2,PCO2:2,PO2:2,HCO3:2 in the last 72 hours  Studies/Results: No results found.  Anti-infectives: Anti-infectives     Start     Dose/Rate Route Frequency Ordered Stop   02/16/12 0615   aztreonam (AZACTAM) 500 mg in dextrose 5 % 50 mL IVPB        500 mg 100 mL/hr over 30 Minutes Intravenous  Once 02/16/12 0601 02/16/12 0708   02/15/12 1700   metroNIDAZOLE (FLAGYL) IVPB 500 mg        500 mg 100 mL/hr over 60 Minutes Intravenous Every 8 hours 02/15/12 1335 02/16/12 1610   02/15/12 0744   metroNIDAZOLE (FLAGYL) IVPB 500  mg        500 mg 100 mL/hr over 60 Minutes Intravenous 60 min pre-op 02/15/12 0744 02/15/12 0920          Assessment/Plan: s/p Procedure(s) (LRB) with comments: EXPLORATORY LAPAROTOMY (N/A) - oversewing of anastomotic leak and rigid sigmoidoscopy she has not had any further evidence of bleeding and good sats since extubation but her main issue is delirium.  I was not aware of the confusion until this am when I came to visit the patient.  Will start to work this up with cardiac labs, ABG, head CT.  Her HGB is stable and no evidence of infection.  LOS: 2 days    Lodema Pilot DAVID 02/17/2012

## 2012-02-17 NOTE — Progress Notes (Signed)
Labs normal, ABG okay, Ct without acute change, no evidence of infection.  She is resting comfortably now but still no cause found for her delirium.  Might still be due to anesthesia will observe for now.

## 2012-02-17 NOTE — Progress Notes (Signed)
eLink Physician-Brief Progress Note Patient Name: MCKAY BRANDT DOB: Dec 26, 1950 MRN: [id number]  Date of Service  02/17/2012   HPI/Events of Note   Hypertensive, not taking po meds  eICU Interventions  Add prn hydralazine   Intervention Category Major Interventions: Hypertension - evaluation and management  MCQUAID, DOUGLAS 02/17/2012, 9:59 PM

## 2012-02-18 ENCOUNTER — Encounter (HOSPITAL_COMMUNITY): Payer: Self-pay | Admitting: Surgery

## 2012-02-18 ENCOUNTER — Encounter: Payer: Self-pay | Admitting: Oncology

## 2012-02-18 ENCOUNTER — Inpatient Hospital Stay (HOSPITAL_COMMUNITY): Payer: Medicare Other

## 2012-02-18 LAB — CBC
Platelets: 160 10*3/uL (ref 150–400)
RDW: 15.2 % (ref 11.5–15.5)
WBC: 14.4 10*3/uL — ABNORMAL HIGH (ref 4.0–10.5)

## 2012-02-18 MED ORDER — HYDROMORPHONE HCL PF 1 MG/ML IJ SOLN
1.0000 mg | INTRAMUSCULAR | Status: DC | PRN
  Administered 2012-02-18 – 2012-02-26 (×25): 1 mg via INTRAVENOUS
  Filled 2012-02-18 (×25): qty 1

## 2012-02-18 MED ORDER — FUROSEMIDE 10 MG/ML IJ SOLN
20.0000 mg | Freq: Two times a day (BID) | INTRAMUSCULAR | Status: DC
  Administered 2012-02-18 – 2012-02-21 (×7): 20 mg via INTRAVENOUS
  Filled 2012-02-18 (×9): qty 2

## 2012-02-18 NOTE — Progress Notes (Signed)
PHYSICIAN: Abigail Miyamoto, M.D.   DATE OF PROCEDURE: 02/15/2012  DATE OF DISCHARGE:  OPERATIVE REPORT  PREOPERATIVE DIAGNOSIS: Chronic lower gastrointestinal bleeding.  POSTOPERATIVE DIAGNOSIS: Chronic lower gastrointestinal bleeding.  PROCEDURE:  1. Left partial colectomy.  2. Take down of the splenic flexure.

## 2012-02-18 NOTE — Progress Notes (Signed)
Pt had periods were she was verbally interactive. She spoke in repeatative sentences, "Come on girl", "Help Me Help Me", "Forgive Me".   Still unable to take PO meds, refuses to sip water or allow oral care. Pt was not able to use PCA pump, at start of shift there was not a syringe present. Due to pt mental status 0.5mg  dilaudid was given q1hr when warranted.   B/P elevated, called placed to [facility] around 22:00, given order for PRN hydralazine q 4hr for SBP>160.

## 2012-02-18 NOTE — Progress Notes (Signed)
CARE MANAGEMENT NOTE 02/18/2012  Patient:  [name], [name]   Account Number:  [id number]  Date Initiated:  02/18/2012  Documentation initiated by:  DAVIS,RHONDA  Subjective/Objective Assessment:   patient with post op stay after colectomy, active bleeding post op and required prbc x4 units,htn-hydralizline added, monitoring of cardiac and vs.     Action/Plan:   from home   Anticipated DC Date:  02/21/2012   Anticipated DC Plan:  HOME/SELF CARE  In-house referral  NA      DC Planning Services  NA      [facility] Choice  NA   Choice offered to / List presented to:  NA   DME arranged  NA      DME agency  NA     HH arranged  NA      HH agency  NA   Status of service:  In process, will continue to follow Medicare Important Message given?  NA - LOS <3 / Initial given by admissions (If response is "NO", the following Medicare IM given date fields will be blank) Date Medicare IM given:   Date Additional Medicare IM given:    Discharge Disposition:    Per UR Regulation:  Reviewed for med. necessity/level of care/duration of stay  If discussed at Long Length of Stay Meetings, dates discussed:    Comments:  [id number]/Rhonda Earlene Plater, RN, BSN, CCM: CHART REVIEWED AND UPDATED. NO DISCHARGE NEEDS PRESENT AT THIS TIME. CASE MANAGEMENT [phone]

## 2012-02-18 NOTE — Progress Notes (Signed)
Spoke with Ms. [address] Petty, pt's home care giver to see if she could offer some advice that may be of assistance with pt. She stated that pt has a certain order at home that is not deviated from. She is given her Zoloft and Prozac first (at times they have used Ativan gel) Ms. [name] stated without her "mood stabilizer's" pt will not cooperate.   Ms. [name] stated that pt had been able to carry on normal conversations and verbalize her needs with no problems.   [name] has offered that if we need any further assistance to please feel free to call.

## 2012-02-18 NOTE — Clinical Documentation Improvement (Signed)
Anemia Blood Loss Clarification  THIS DOCUMENT IS NOT A PERMANENT PART OF THE MEDICAL RECORD  RESPOND TO THE THIS QUERY, FOLLOW THE INSTRUCTIONS BELOW:  1. If needed, update documentation for the patient's encounter via the notes activity.  2. Access this query again and click edit on the In [employer].  3. After updating, or not, click F2 to complete all highlighted (required) fields concerning your review. Select "additional documentation in the medical record" OR "no additional documentation provided".  4. Click Sign note button.  5. The deficiency will fall out of your In Basket *Please let us know if you are not able to complete this workflow by phone or e-mail (listed below).        02/18/12  Dear Dr. [facility]  In an effort to better capture your patient's severity of illness, reflect appropriate length of stay and utilization of resources, a review of the patient medical record has revealed the following indicators.    Based on your clinical judgment, please clarify and document in a progress note and/or discharge summary the clinical condition associated with the following supporting information:  In responding to this query please exercise your independent judgment.  The fact that a query is asked, does not imply that any particular answer is desired or expected. 02/18/12 prog note..."2 Days Post-Op"..."Post op anastomotic bleeding".Marland KitchenMarland KitchenFor accurate Dx specificity & severity please help clarify possible, probable, likely or suspected clinical cond being eval'd, mon'd & tx'd. Thank you   Possible Clinical Conditions?  Expected Acute Blood Loss Anemia  Acute Blood Loss Anemia Acute on chronic blood loss anemia  Chronic blood loss anemia Precipitous drop in Hematocrit  Other Condition: (please specify) Cannot Clinically Determine  Supporting Information: Risk Factors:  02/15/12: PROCEDURE:1. Left partial colectomy; 2. Take down of the splenic  flexure.  Signs and Symptoms: 02/16/12 Op note..." Overnight she has had persistent rectal bleeding and clots. Her vital signs have been stable but she has required 6 units of packed cells with still some decrease in her hemoglobin in early this morning is still passing large clots per rectum.".Marland KitchenMarland Kitchen  Diagnostics: Treatments: 02/16/12: Procedure(s): EXPLORATORY LAPAROTOMY AND OVERSEWING OF COLONIC ANASTAMOSIS, RIGID SIGMOIDOSCOPY  Transfusion: 02/16/22 Per notes: "transfuse PRBC's and FFP" See above note & reurn to OR noted for control of bleeding, cont's ICU monitoring, cont's to trend labs   Reviewed: additional documentation in the medical record  Thank You,  Toribio Harbour, RN, BSN, CCDS Certified Clinical Documentation Specialist Pager: [phone] Health Information Management [place]

## 2012-02-18 NOTE — Clinical Documentation Improvement (Signed)
GENERIC DOCUMENTATION CLARIFICATION QUERY  THIS DOCUMENT IS NOT A PERMANENT PART OF THE MEDICAL RECORD  TO RESPOND TO THE THIS QUERY, FOLLOW THE INSTRUCTIONS BELOW:  1. If needed, update documentation for the patient's encounter via the notes activity.  2. Access this query again and click edit on the In [employer].  3. After updating, or not, click F2 to complete all highlighted (required) fields concerning your review. Select "additional documentation in the medical record" OR "no additional documentation provided".  4. Click Sign note button.  5. The deficiency will fall out of your In Basket *Please let us know if you are not able to complete this workflow by phone or e-mail (listed below).  Please update your documentation within the medical record to reflect your response to this query.                                                                                        02/18/12   Dear Dr. [facility],  In a better effort to capture your patient's severity of illness, reflect appropriate length of stay and utilization of resources, a review of the patient medical record has revealed the following indicators.    Based on your clinical judgment, please clarify and document in a progress note and/or discharge summary the clinical condition associated with the following supporting information:  In responding to this query please exercise your independent judgment.  The fact that a query is asked, does not imply that any particular answer is desired or expected. 02/18/12 Prog note.Marland KitchenMarland Kitchen"Patient remains awake and alert, but confused, refusing PO.".Marland KitchenMarland Kitchen For accurate Dx specificity & severity please help clarify possible, probable, likely or suspected clinical cond being eval'd, mon',d & tx'd. Thank you   Possible Clinical Conditions? -Post-op Delirium  -Post-op Confusion  -Encephalopathy (specify type if possible)  -Mental Status changes  Other Condition (please  specify) Cannot Clinically Determine  Supporting Information: Risk Factors: S/P Scheduled Surgery 02/15/12 and return to OR 02/16/12 for procedure/contol of bleeding, received anesthesia x2  Signs & Symptoms: 02/17/12 Prog note "...patient has either been resting comfortably or delirious.".Marland KitchenMarland Kitchen  Diagnostics:  Treatment: 02/18/12 Prog note.Marland KitchenMarland Kitchen"Continue to restrain for safety"..."Leave foley secondary to patients mental status"...  You may use possible, probable, or suspect with inpatient documentation. possible, probable, suspected diagnoses MUST be documented at the time of discharge  Reviewed: additional documentation in the medical record  Thank You,  Toribio Harbour, RN, BSN, CCDS Certified Clinical Documentation Specialist Pager: [phone]  Health Information Management [place]

## 2012-02-18 NOTE — Progress Notes (Signed)
2 Days Post-Op  Subjective: Events of weekend noted Patient remains awake and alert, but confused, refusing PO  Objective: Vital signs in last 24 hours: Temp:  [97.8 F (36.6 C)-99.9 F (37.7 C)] 97.8 F (36.6 C) (09/23 0419) Pulse Rate:  [84-113] 113  (09/23 0700) Resp:  [12-27] 23  (09/23 0700) BP: (149-188)/(62-96) 149/77 mmHg (09/23 0700) SpO2:  [90 %-99 %] 93 % (09/23 0700) Weight:  [195 lb 1.7 oz (88.5 kg)] 195 lb 1.7 oz (88.5 kg) (09/23 0547) Last BM Date: 02/16/12  Intake/Output from previous day: 09/22 0701 - 09/23 0700 In: 2350 [I.V.:2250; IV Piggyback:100] Out: 2890 [Urine:2890] Intake/Output this shift:    Awake and alert Lungs clear Abdomen soft, dressing dry  Lab Results:   Basename 02/17/12 1800 02/17/12 1203 02/17/12 0526 02/16/12 0300  WBC -- -- 11.6* 13.1*  HGB 8.6* 8.8* -- --  HCT 25.4* 26.0* -- --  PLT -- -- 110* 168   BMET  Basename 02/17/12 0526 02/16/12 0300  NA 141 137  K 4.0 4.2  CL 110 103  CO2 25 24  GLUCOSE 151* 216*  BUN 13 13  CREATININE 0.74 1.06  CALCIUM 7.4* 7.0*   PT/INR  Basename 02/16/12 0515  LABPROT 14.1  INR 1.10   ABG  Basename 02/17/12 0747  PHART 7.321*  HCO3 24.4*    Studies/Results: Ct Head Wo Contrast  02/17/2012  *RADIOLOGY REPORT*  Clinical Data: Post op abdominal surgery, now with acute mental status change  CT HEAD WITHOUT CONTRAST  Technique:  Contiguous axial images were obtained from the base of the skull through the vertex without contrast.  Comparison: Head CT - 02/24/2010; 02/28/2009; 02/24/2009  Findings:  Geographic ill-defined hypodensity within the left insular cortex is unchanged.  Wallace Cullens white differentiation is otherwise well maintained without CT evidence of acute large territory infarct. No intraparenchymal or extra-axial mass or hemorrhage.  Unchanged size and configuration of the ventricles and basilar cisterns.  Regional soft tissues are normal.  No displaced calvarial fracture. Normal  appearance of the paranasal sinuses and mastoid air cells.  IMPRESSION: Stable sequela of microvascular ischemic disease without acute intracranial process.   Original Report Authenticated By: Waynard Reeds, M.D.     Anti-infectives: Anti-infectives     Start     Dose/Rate Route Frequency Ordered Stop   02/16/12 0615   aztreonam (AZACTAM) 500 mg in dextrose 5 % 50 mL IVPB        500 mg 100 mL/hr over 30 Minutes Intravenous  Once 02/16/12 0601 02/16/12 0708   02/15/12 1700   metroNIDAZOLE (FLAGYL) IVPB 500 mg        500 mg 100 mL/hr over 60 Minutes Intravenous Every 8 hours 02/15/12 1335 02/16/12 0632   02/15/12 0744   metroNIDAZOLE (FLAGYL) IVPB 500 mg        500 mg 100 mL/hr over 60 Minutes Intravenous 60 min pre-op 02/15/12 0744 02/15/12 0920          Assessment/Plan: s/p Procedure(s) (LRB) with comments: EXPLORATORY LAPAROTOMY (N/A) - oversewing of anastomotic leak and rigid sigmoidoscopy Post op anastomotic bleeding  Continue to restrain for safety Out of bed Hold po meds Try clear liquids Leave foley secondary to patients mental status  LOS: 3 days    Haunani Dickard A 02/18/2012

## 2012-02-19 LAB — TYPE AND SCREEN
Unit division: 0
Unit division: 0
Unit division: 0
Unit division: 0

## 2012-02-19 LAB — GLUCOSE, CAPILLARY
Glucose-Capillary: 122 mg/dL — ABNORMAL HIGH (ref 70–99)
Glucose-Capillary: 98 mg/dL (ref 70–99)

## 2012-02-19 LAB — BASIC METABOLIC PANEL
CO2: 27 mEq/L (ref 19–32)
Glucose, Bld: 97 mg/dL (ref 70–99)
Potassium: 3 mEq/L — ABNORMAL LOW (ref 3.5–5.1)
Sodium: 137 mEq/L (ref 135–145)

## 2012-02-19 LAB — CBC
Hemoglobin: 8.6 g/dL — ABNORMAL LOW (ref 12.0–15.0)
Platelets: 180 10*3/uL (ref 150–400)
RBC: 2.73 MIL/uL — ABNORMAL LOW (ref 3.87–5.11)
WBC: 10.9 10*3/uL — ABNORMAL HIGH (ref 4.0–10.5)

## 2012-02-19 MED ORDER — ADULT MULTIVITAMIN W/MINERALS CH
1.0000 | ORAL_TABLET | Freq: Every day | ORAL | Status: DC
  Administered 2012-02-20 – 2012-02-27 (×5): 1 via ORAL
  Filled 2012-02-19 (×10): qty 1

## 2012-02-19 MED ORDER — LORAZEPAM 2 MG/ML IJ SOLN
1.0000 mg | Freq: Four times a day (QID) | INTRAMUSCULAR | Status: AC | PRN
  Administered 2012-02-19: 1 mg via INTRAVENOUS
  Filled 2012-02-19: qty 1

## 2012-02-19 MED ORDER — FOLIC ACID 1 MG PO TABS
1.0000 mg | ORAL_TABLET | Freq: Every day | ORAL | Status: DC
  Administered 2012-02-20 – 2012-02-28 (×7): 1 mg via ORAL
  Filled 2012-02-19 (×10): qty 1

## 2012-02-19 MED ORDER — FLUOXETINE HCL 20 MG/5ML PO SOLN: 20.0000 mg | Freq: Every day | ORAL | Status: DC

## 2012-02-19 MED ORDER — INSULIN ASPART 100 UNIT/ML ~~LOC~~ SOLN
0.0000 [IU] | SUBCUTANEOUS | Status: DC
  Administered 2012-02-19 – 2012-02-20 (×3): 3 [IU] via SUBCUTANEOUS

## 2012-02-19 MED ORDER — VITAMIN B-1 100 MG PO TABS
100.0000 mg | ORAL_TABLET | Freq: Every day | ORAL | Status: DC
  Administered 2012-02-20 – 2012-02-27 (×5): 100 mg via ORAL
  Filled 2012-02-19 (×10): qty 1

## 2012-02-19 MED ORDER — LORAZEPAM 1 MG PO TABS: 1.0000 mg | ORAL_TABLET | Freq: Four times a day (QID) | ORAL | Status: AC | PRN

## 2012-02-19 MED ORDER — ATENOLOL 50 MG PO TABS
50.0000 mg | ORAL_TABLET | Freq: Every day | ORAL | Status: DC
  Administered 2012-02-20 – 2012-02-28 (×9): 50 mg via ORAL
  Filled 2012-02-19 (×10): qty 1

## 2012-02-19 MED ORDER — TRAZODONE HCL 100 MG PO TABS
100.0000 mg | ORAL_TABLET | Freq: Every day | ORAL | Status: DC
  Administered 2012-02-20 – 2012-02-27 (×8): 100 mg via ORAL
  Filled 2012-02-19 (×11): qty 1

## 2012-02-19 MED ORDER — FLUOXETINE HCL 20 MG PO CAPS
20.0000 mg | ORAL_CAPSULE | Freq: Every day | ORAL | Status: DC
  Administered 2012-02-20 – 2012-02-27 (×8): 20 mg via ORAL
  Filled 2012-02-19 (×10): qty 1

## 2012-02-19 MED ORDER — THIAMINE HCL 100 MG/ML IJ SOLN
100.0000 mg | Freq: Every day | INTRAMUSCULAR | Status: DC
  Administered 2012-02-19: 200 mg via INTRAVENOUS
  Administered 2012-02-24: 100 mg via INTRAVENOUS
  Filled 2012-02-19 (×10): qty 1

## 2012-02-19 NOTE — Progress Notes (Signed)
Pt had medium bloody bright red stool w/ large clot.

## 2012-02-19 NOTE — Progress Notes (Signed)
3 Days Post-Op  Subjective: Still not following commands well. Refusing po +BM (still bloody)  Objective: Vital signs in last 24 hours: Temp:  [98.3 F (36.8 C)-99.5 F (37.5 C)] 98.4 F (36.9 C) (09/24 0400) Pulse Rate:  [87-118] 112  (09/24 0700) Resp:  [14-29] 21  (09/24 0700) BP: (132-190)/(61-102) 156/78 mmHg (09/24 0700) SpO2:  [90 %-97 %] 95 % (09/24 0700) Weight:  [185 lb 13.6 oz (84.3 kg)] 185 lb 13.6 oz (84.3 kg) (09/24 0400) Last BM Date: 02/16/12  Intake/Output from previous day: 09/23 0701 - 09/24 0700 In: 2404 [I.V.:2300; IV Piggyback:104] Out: 3701 [Urine:3700; Stool:1] Intake/Output this shift:   Lungs clear Abdomen soft, dressing with wics intact, incision clean   Lab Results:   [facility] 02/19/12 0334 02/18/12 0750  WBC 10.9* 14.4*  HGB 8.6* 9.9*  HCT 24.6* 28.5*  PLT 180 160   BMET  Basename 02/19/12 0334 02/17/12 0526  NA 137 141  K 3.0* 4.0  CL 100 110  CO2 27 25  GLUCOSE 97 151*  BUN 7 13  CREATININE 0.56 0.74  CALCIUM 8.0* 7.4*   PT/INR No results found for this basename: LABPROT:2,INR:2 in the last 72 hours ABG  Basename 02/17/12 0747  PHART 7.321*  HCO3 24.4*    Studies/Results: Ct Head Wo Contrast  02/17/2012  *RADIOLOGY REPORT*  Clinical Data: Post op abdominal surgery, now with acute mental status change  CT HEAD WITHOUT CONTRAST  Technique:  Contiguous axial images were obtained from the base of the skull through the vertex without contrast.  Comparison: Head CT - 02/24/2010; 02/28/2009; 02/24/2009  Findings:  Geographic ill-defined hypodensity within the left insular cortex is unchanged.  Wallace Cullens white differentiation is otherwise well maintained without CT evidence of acute large territory infarct. No intraparenchymal or extra-axial mass or hemorrhage.  Unchanged size and configuration of the ventricles and basilar cisterns.  Regional soft tissues are normal.  No displaced calvarial fracture. Normal appearance of the paranasal  sinuses and mastoid air cells.  IMPRESSION: Stable sequela of microvascular ischemic disease without acute intracranial process.   Original Report Authenticated By: Waynard Reeds, M.D.    Dg Chest Port 1 View  02/18/2012  *RADIOLOGY REPORT*  Clinical Data: Increased oxygen requirements.  PORTABLE CHEST - 1 VIEW  Comparison: 05/04/2011  Findings: Low lung volumes.  Dense opacity in the right upper hemithorax.  This could represent loculated pleural effusion or atelectatic lung.  Left basilar atelectasis.  No effusions.  Heart size is accentuated by the low volumes.  IMPRESSION: Opacity in the right upper chest, question atelectatic lung versus loculated right effusion.  Very low lung volumes.  Left base atelectasis.   Original Report Authenticated By: Cyndie Chime, M.D.     Anti-infectives: Anti-infectives     Start     Dose/Rate Route Frequency Ordered Stop   02/16/12 0615   aztreonam (AZACTAM) 500 mg in dextrose 5 % 50 mL IVPB        500 mg 100 mL/hr over 30 Minutes Intravenous  Once 02/16/12 0601 02/16/12 0708   02/15/12 1700   metroNIDAZOLE (FLAGYL) IVPB 500 mg        500 mg 100 mL/hr over 60 Minutes Intravenous Every 8 hours 02/15/12 1335 02/16/12 0632   02/15/12 0744   metroNIDAZOLE (FLAGYL) IVPB 500 mg        500 mg 100 mL/hr over 60 Minutes Intravenous 60 min pre-op 02/15/12 0744 02/15/12 0920  Assessment/Plan: s/p Procedure(s) (LRB) with comments: EXPLORATORY LAPAROTOMY (N/A) - oversewing of anastomotic leak and rigid sigmoidoscopy  -Post Op blood loss anemia -- anastomotic bleed.  Still may have slight ooze.  Hgb/Hct stable  -Mental status changes--suspect withdrawal from meds.  Will start protocol.  Try to get her back on her home meds  --encourage po  LOS: 4 days    [name] A 02/19/2012

## 2012-02-20 LAB — CBC
HCT: 27.3 % — ABNORMAL LOW (ref 36.0–46.0)
Hemoglobin: 9.1 g/dL — ABNORMAL LOW (ref 12.0–15.0)
MCH: 30 pg (ref 26.0–34.0)
MCHC: 33.3 g/dL (ref 30.0–36.0)
RBC: 3.03 MIL/uL — ABNORMAL LOW (ref 3.87–5.11)

## 2012-02-20 LAB — GLUCOSE, CAPILLARY
Glucose-Capillary: 114 mg/dL — ABNORMAL HIGH (ref 70–99)
Glucose-Capillary: 108 mg/dL — ABNORMAL HIGH (ref 70–99)

## 2012-02-20 NOTE — Progress Notes (Signed)
4 Days Post-Op  Subjective: Still confused, but mental status improving Taking some po now  Objective: Vital signs in last 24 hours: Temp:  [98.4 F (36.9 C)-99.4 F (37.4 C)] 98.4 F (36.9 C) (09/25 0800) Pulse Rate:  [92-142] 92  (09/25 0800) Resp:  [15-26] 17  (09/25 0800) BP: (149-195)/(62-95) 161/78 mmHg (09/25 0800) SpO2:  [91 %-98 %] 97 % (09/25 0800) Last BM Date: 02/19/12  Intake/Output from previous day: 09/24 0701 - 09/25 0700 In: 1900 [I.V.:1900] Out: 4051 [Urine:4050; Stool:1] Intake/Output this shift: Total I/O In: 100 [I.V.:100] Out: -   Abdomen soft, non distended, minimally tender  Lab Results:   Basename 02/20/12 0310 02/19/12 0334  WBC 11.7* 10.9*  HGB 9.1* 8.6*  HCT 27.3* 24.6*  PLT 218 180   BMET  Basename 02/19/12 0334  NA 137  K 3.0*  CL 100  CO2 27  GLUCOSE 97  BUN 7  CREATININE 0.56  CALCIUM 8.0*   PT/INR No results found for this basename: LABPROT:2,INR:2 in the last 72 hours ABG No results found for this basename: PHART:2,PCO2:2,PO2:2,HCO3:2 in the last 72 hours  Studies/Results: No results found.  Anti-infectives: Anti-infectives     Start     Dose/Rate Route Frequency Ordered Stop   02/16/12 0615   aztreonam (AZACTAM) 500 mg in dextrose 5 % 50 mL IVPB        500 mg 100 mL/hr over 30 Minutes Intravenous  Once 02/16/12 0601 02/16/12 0708   02/15/12 1700   metroNIDAZOLE (FLAGYL) IVPB 500 mg        500 mg 100 mL/hr over 60 Minutes Intravenous Every 8 hours 02/15/12 1335 02/16/12 0632   02/15/12 0744   metroNIDAZOLE (FLAGYL) IVPB 500 mg        500 mg 100 mL/hr over 60 Minutes Intravenous 60 min pre-op 02/15/12 0744 02/15/12 0920          Assessment/Plan: s/p Procedure(s) (LRB) with comments: EXPLORATORY LAPAROTOMY (N/A) - oversewing of anastomotic leak and rigid sigmoidoscopy  -s/p re-exploration for anastomotic bleed. Post op blood loss anemia.  H/H stable  -mental status--suspect withdrawal/med related--CT  head Neg.  Hopefully transfer to floor tomorrow.  She is improving slowly each day.  --encourage po  LOS: 5 days    [name] A 02/20/2012

## 2012-02-21 LAB — BASIC METABOLIC PANEL
BUN: 7 mg/dL (ref 6–23)
GFR calc non Af Amer: >90 mL/min (ref >90)
Glucose, Bld: 102 mg/dL — ABNORMAL HIGH (ref 70–99)
Potassium: 2.7 mEq/L — CL (ref 3.5–5.1)

## 2012-02-21 LAB — GLUCOSE, CAPILLARY
Glucose-Capillary: 86 mg/dL (ref 70–99)
Glucose-Capillary: 90 mg/dL (ref 70–99)
Glucose-Capillary: 91 mg/dL (ref 70–99)
Glucose-Capillary: 92 mg/dL (ref 70–99)

## 2012-02-21 MED ORDER — LISINOPRIL 40 MG PO TABS
40.0000 mg | ORAL_TABLET | Freq: Every morning | ORAL | Status: DC
  Administered 2012-02-21 – 2012-02-28 (×8): 40 mg via ORAL
  Filled 2012-02-21 (×8): qty 1

## 2012-02-21 MED ORDER — TRAZODONE HCL 100 MG PO TABS
100.0000 mg | ORAL_TABLET | Freq: Every day | ORAL | Status: DC
  Filled 2012-02-21: qty 1

## 2012-02-21 MED ORDER — POTASSIUM CHLORIDE 10 MEQ/100ML IV SOLN
10.0000 meq | Freq: Once | INTRAVENOUS | Status: AC
  Administered 2012-02-21: 10 meq via INTRAVENOUS
  Filled 2012-02-21: qty 100

## 2012-02-21 MED ORDER — POTASSIUM CHLORIDE CRYS ER 20 MEQ PO TBCR
20.0000 meq | EXTENDED_RELEASE_TABLET | Freq: Every day | ORAL | Status: DC
  Administered 2012-02-21: 20 meq via ORAL
  Filled 2012-02-21 (×2): qty 1

## 2012-02-21 MED ORDER — TRAMADOL HCL 50 MG PO TABS
100.0000 mg | ORAL_TABLET | Freq: Two times a day (BID) | ORAL | Status: DC | PRN
  Administered 2012-02-22 – 2012-02-26 (×5): 100 mg via ORAL
  Filled 2012-02-21: qty 1
  Filled 2012-02-21: qty 2
  Filled 2012-02-21: qty 1
  Filled 2012-02-21 (×3): qty 2
  Filled 2012-02-21 (×2): qty 1

## 2012-02-21 MED ORDER — POTASSIUM CHLORIDE 10 MEQ/100ML IV SOLN
10.0000 meq | INTRAVENOUS | Status: AC
  Administered 2012-02-21 (×4): 10 meq via INTRAVENOUS

## 2012-02-21 MED ORDER — POTASSIUM CHLORIDE 10 MEQ/100ML IV SOLN
INTRAVENOUS | Status: AC
  Filled 2012-02-21: qty 100

## 2012-02-21 MED ORDER — POTASSIUM CHLORIDE 10 MEQ/100ML IV SOLN
INTRAVENOUS | Status: AC
  Administered 2012-02-21: 10 meq via INTRAVENOUS
  Filled 2012-02-21: qty 300

## 2012-02-21 MED ORDER — ACETAMINOPHEN 325 MG PO TABS
650.0000 mg | ORAL_TABLET | Freq: Four times a day (QID) | ORAL | Status: DC | PRN
  Filled 2012-02-21: qty 2

## 2012-02-21 MED ORDER — ATENOLOL 50 MG PO TABS
50.0000 mg | ORAL_TABLET | Freq: Every morning | ORAL | Status: DC
  Filled 2012-02-21: qty 1

## 2012-02-21 MED ORDER — POTASSIUM CHLORIDE IN NACL 20-0.9 MEQ/L-% IV SOLN
INTRAVENOUS | Status: DC
  Administered 2012-02-21 – 2012-02-23 (×2): via INTRAVENOUS
  Administered 2012-02-24: 20 mL/h via INTRAVENOUS
  Filled 2012-02-21 (×7): qty 1000

## 2012-02-21 MED ORDER — GABAPENTIN 300 MG PO CAPS
600.0000 mg | ORAL_CAPSULE | Freq: Three times a day (TID) | ORAL | Status: DC
  Administered 2012-02-21 – 2012-02-28 (×21): 600 mg via ORAL
  Filled 2012-02-21 (×25): qty 2

## 2012-02-21 NOTE — Progress Notes (Signed)
Patient now sitting up trying to eat but states she does not want any of the full liquids, explained that this is what MD has ordered for now. Patient is calm and appears more oriented to situation. Informed her that i called her son Fayrene Fearing and informed him she would like him to come and he states he will be here shortly, patient appears happy about this.

## 2012-02-21 NOTE — Progress Notes (Signed)
Dr. Magnus Ivan to see tomorrow

## 2012-02-21 NOTE — Progress Notes (Signed)
Patient requests her son be called, called her son Louine Schalk and he states he will be here shortly. Patient states she is hungery but not agreeable to diet given by MD, Will aware, ordered a diet will attempt to get patient to eat. Appears more calm at this time. Will states he would try orienting patient before any use of ativan and only upto 0.25mg  if ativan needed.

## 2012-02-21 NOTE — Progress Notes (Signed)
Patient to be moved to room 1540, for closer monitoring due to increased confusion and her current bed's bed alarm is out of survice. Myself and NT x2 attempting to assist patient to new bed. Patient tearful saying she does not know where she is at. Multiple attempts to orient patient. Patient moved to in between two beds and refusing to move, swinging arms at NT. Explained to patient that her bed is not working properly at this time and patient moved to new bed. Will jennings now on floor and informed of this situation and of current medications.

## 2012-02-21 NOTE — Progress Notes (Signed)
eLink Physician-Brief Progress Note Patient Name: KARMELLA RITCHISON DOB: March 20, 1951 MRN: [id number]  Date of Service  02/21/2012   HPI/Events of Note     eICU Interventions  Hypokalemia -repleted    Intervention Category Intermediate Interventions: Electrolyte abnormality - evaluation and management  Maryclare Nydam V. 02/21/2012, 4:49 AM

## 2012-02-21 NOTE — Progress Notes (Signed)
5 Days Post-Op  Subjective: Ask to see, for increasing confusion.  She doesn't remember surgery or where she is.  Thinks staff is mean, she is hungry and wants to eat.  Objective: Vital signs in last 24 hours: Temp:  [98.2 F (36.8 C)-99.9 F (37.7 C)] 98.2 F (36.8 C) (09/26 1400) Pulse Rate:  [68-79] 71  (09/26 1400) Resp:  [13-20] 16  (09/26 1400) BP: (133-192)/(64-102) 174/102 mmHg (09/26 1400) SpO2:  [94 %-100 %] 94 % (09/26 1400) Weight:  [80.1 kg (176 lb 9.4 oz)] 80.1 kg (176 lb 9.4 oz) (09/26 0200) Last BM Date: 02/21/12  Intake/Output from previous day: 09/25 0701 - 09/26 0700 In: 2250 [I.V.:2100; IV Piggyback:150] Out: 1610 [Urine:3675; Stool:1] Intake/Output this shift: Total I/O In: 332.5 [I.V.:32.5; IV Piggyback:300] Out: 925 [Urine:925]  General appearance: alert, uncooperative and initially, but after talking to her she would remember she was at [facility]. Resp: clear to auscultation bilaterally GI: soft, incision looks fine, hard to see alot because she wants to stay on her left side. Neurologic: Awake, speech is clear, she doesn't remember surgery, but remembers Dr. Magnus Ivan moving all extremities well. L AKA. CN intact. BP 150/80, glucose 86, Lab Results:   Basename 02/20/12 0310 02/19/12 0334  WBC 11.7* 10.9*  HGB 9.1* 8.6*  HCT 27.3* 24.6*  PLT 218 180    BMET  Basename 02/21/12 0315 02/19/12 0334  NA 133* 137  K 2.7* 3.0*  CL 96 100  CO2 25 27  GLUCOSE 102* 97  BUN 7 7  CREATININE 0.52 0.56  CALCIUM 7.7* 8.0*   PT/INR No results found for this basename: LABPROT:2,INR:2 in the last 72 hours  No results found for this basename: AST:5,ALT:5,ALKPHOS:5,BILITOT:5,PROT:5,ALBUMIN:5 in the last 168 hours   Lipase     Component Value Date/Time   LIPASE 156* 05/04/2011 1115     Studies/Results: No results found.  Medications:    . atenolol  50 mg Oral Daily  . calcium chloride  1 g Intravenous Once  . famotidine (PEPCID) IV  20 mg Intravenous  Q12H  . FLUoxetine  20 mg Oral Daily  . folic acid  1 mg Oral Daily  . gabapentin  600 mg Oral TID  .  HYDROmorphone (DILAUDID) injection  0.5 mg Intravenous Once  . insulin aspart  0-20 Units Subcutaneous Q4H  . lisinopril  40 mg Oral q morning - 10a  . multivitamin with minerals  1 tablet Oral Daily  . potassium chloride  10 mEq Intravenous Q1 Hr x 5  . potassium chloride  10 mEq Intravenous Once  . potassium chloride      . potassium chloride SA  20 mEq Oral Daily  . thiamine  100 mg Oral Daily   Or  . thiamine  100 mg Intravenous Daily  . traZODone  100 mg Oral QHS  . traZODone  100 mg Oral QHS  . DISCONTD: atenolol  50 mg Oral q morning - 10a  . DISCONTD: furosemide  20 mg Intravenous Q12H   Prior to Admission medications   Medication Sig Start Date End Date Taking? Authorizing Provider  atenolol (TENORMIN) 50 MG tablet Take 50 mg by mouth every morning.    Yes Historical Provider, MD  cetirizine (ZYRTEC) 10 MG tablet Take 10 mg by mouth daily.    Yes Historical Provider, MD  esomeprazole (NEXIUM) 40 MG capsule Take 40 mg by mouth daily before breakfast.   Yes Historical Provider, MD  FLUoxetine (PROZAC) 20 MG capsule Take 20  mg by mouth every morning.    Yes Historical Provider, MD  gabapentin (NEURONTIN) 300 MG capsule Take 600 mg by mouth 3 (three) times daily.    Yes Historical Provider, MD  hydrochlorothiazide (HYDRODIURIL) 12.5 MG tablet Take 12.5 mg by mouth every morning.    Yes Historical Provider, MD  lisinopril (PRINIVIL,ZESTRIL) 40 MG tablet Take 40 mg by mouth every morning.    Yes Historical Provider, MD  potassium chloride SA (K-DUR,KLOR-CON) 20 MEQ tablet Take 20 mEq by mouth daily.   Yes Historical Provider, MD  traMADol (ULTRAM) 50 MG tablet Take 50 mg by mouth every 6 (six) hours as needed. For pain.Maximum dose= 8 tablets per day   Yes Historical Provider, MD  traZODone (DESYREL) 100 MG tablet Take 100 mg by mouth at bedtime.    Yes Historical Provider, MD    vitamin C (ASCORBIC ACID) 500 MG tablet Take 500 mg by mouth daily.   Yes Historical Provider, MD  aspirin EC 81 MG tablet Take 162 mg by mouth 2 (two) times daily.    Historical Provider, MD    Assessment/Plan  Called for confusion  lower gastrointestinal bleed, post operataive bleeding following partial colectomy s/p exploratory lap, over sewing of colonic anastomosis/rigid sigmoidoscopy 9/21  S/p Left partial colectomy take down of splenic flexure 02/15/12 for lower GI bleed. Hypokalemia being replaced, Hyponatremia  Patient Active Problem List  Diagnosis  . DEPRESSION, ACUTE  . Essential hypertension, benign  . GERD  . NECROTIZING FASCIITIS  . CARPAL TUNNEL RELEASE, HX OF  . Anemia, iron deficiency  . Chronic hepatitis C without mention of hepatic coma  . BRBPR (bright red blood per rectum)  . GI bleed  . Leukocytosis  . Hypokalemia  . Rectal bleeding  . Anemia associated with acute blood loss  . Abdominal pain, left lower quadrant  . Diverticulitis   Plan:  After talking with her for some time she's a little tearful.  Just not remembering well. Confusion is better now. She had dilaudid about 4 hours ago. She is on CIWA protocol, she has ativan orderedd but has not had any for two day. We are going to call her family, and get her something to eat.  I will recheck her BMP  again in AM. Tylenol for pain, tramadol for pain she was on that at home  She has allergies listed to morphine and related drugs. Work on reorienting her more than anything else.   LOS: 6 days    Jakin Pavao 02/21/2012

## 2012-02-21 NOTE — Progress Notes (Signed)
5 Days Post-Op  Subjective: No complaints Still having some blood in her BM's Much less confused  Objective: Vital signs in last 24 hours: Temp:  [98.4 F (36.9 C)-99.9 F (37.7 C)] 98.7 F (37.1 C) (09/26 0400) Pulse Rate:  [71-103] 76  (09/26 0600) Resp:  [13-20] 13  (09/26 0600) BP: (133-192)/(64-132) 151/71 mmHg (09/26 0600) SpO2:  [94 %-100 %] 100 % (09/26 0600) Weight:  [176 lb 9.4 oz (80.1 kg)] 176 lb 9.4 oz (80.1 kg) (09/26 0200) Last BM Date: 02/20/12  Intake/Output from previous day: 09/25 0701 - 09/26 0700 In: 2250 [I.V.:2100; IV Piggyback:150] Out: 2725 [Urine:3675; Stool:1] Intake/Output this shift: Total I/O In: 200 [IV Piggyback:200] Out: -   More alert Abdomen soft, Non distended Lungs clear  Lab Results:   Basename 02/20/12 0310 02/19/12 0334  WBC 11.7* 10.9*  HGB 9.1* 8.6*  HCT 27.3* 24.6*  PLT 218 180   BMET  Basename 02/21/12 0315 02/19/12 0334  NA 133* 137  K 2.7* 3.0*  CL 96 100  CO2 25 27  GLUCOSE 102* 97  BUN 7 7  CREATININE 0.52 0.56  CALCIUM 7.7* 8.0*   PT/INR No results found for this basename: LABPROT:2,INR:2 in the last 72 hours ABG No results found for this basename: PHART:2,PCO2:2,PO2:2,HCO3:2 in the last 72 hours  Studies/Results: No results found.  Anti-infectives: Anti-infectives     Start     Dose/Rate Route Frequency Ordered Stop   02/16/12 0615   aztreonam (AZACTAM) 500 mg in dextrose 5 % 50 mL IVPB        500 mg 100 mL/hr over 30 Minutes Intravenous  Once 02/16/12 0601 02/16/12 0708   02/15/12 1700   metroNIDAZOLE (FLAGYL) IVPB 500 mg        500 mg 100 mL/hr over 60 Minutes Intravenous Every 8 hours 02/15/12 1335 02/16/12 0632   02/15/12 0744   metroNIDAZOLE (FLAGYL) IVPB 500 mg        500 mg 100 mL/hr over 60 Minutes Intravenous 60 min pre-op 02/15/12 0744 02/15/12 0920          Assessment/Plan: s/p Procedure(s) (LRB) with comments: EXPLORATORY LAPAROTOMY (N/A) - oversewing of anastomotic leak and  rigid sigmoidoscopy  Hypokalemia--replace IV, stop lasix D/c foley Follow H and H Advance po Transfer to floor PT consult  LOS: 6 days    Tavone Caesar A 02/21/2012

## 2012-02-21 NOTE — Progress Notes (Signed)
Potassium level 2.7 this am. On Lasix. Dr.alva, CCM, ordered 5 runs of KCL 80meq/100ml.

## 2012-02-21 NOTE — Progress Notes (Signed)
Spoke to Dr. Donell Beers who is in OR at this time, informed her patient had confusion in ICCU was placed on CIWA scale. Was only confused to situation when arrived to floor but now disoriented to place, time, and situation. MD states to call Zola Button, PA and have him come see the patient. Will called by Lelon Perla.

## 2012-02-22 LAB — BASIC METABOLIC PANEL
BUN: 7 mg/dL (ref 6–23)
CO2: 24 mEq/L (ref 19–32)
Chloride: 94 mEq/L — ABNORMAL LOW (ref 96–112)
Glucose, Bld: 88 mg/dL (ref 70–99)
Potassium: 3.2 mEq/L — ABNORMAL LOW (ref 3.5–5.1)

## 2012-02-22 LAB — CBC
HCT: 29.1 % — ABNORMAL LOW (ref 36.0–46.0)
Hemoglobin: 9.8 g/dL — ABNORMAL LOW (ref 12.0–15.0)
MCHC: 33.7 g/dL (ref 30.0–36.0)
RBC: 3.24 MIL/uL — ABNORMAL LOW (ref 3.87–5.11)

## 2012-02-22 LAB — GLUCOSE, CAPILLARY
Glucose-Capillary: 88 mg/dL (ref 70–99)
Glucose-Capillary: 85 mg/dL (ref 70–99)
Glucose-Capillary: 100 mg/dL — ABNORMAL HIGH (ref 70–99)
Glucose-Capillary: 122 mg/dL — ABNORMAL HIGH (ref 70–99)

## 2012-02-22 MED ORDER — POTASSIUM CHLORIDE CRYS ER 20 MEQ PO TBCR
20.0000 meq | EXTENDED_RELEASE_TABLET | Freq: Two times a day (BID) | ORAL | Status: DC
  Administered 2012-02-22 – 2012-02-27 (×11): 20 meq via ORAL
  Filled 2012-02-22 (×13): qty 1

## 2012-02-22 MED ORDER — POTASSIUM CHLORIDE 10 MEQ/100ML IV SOLN
10.0000 meq | INTRAVENOUS | Status: AC
  Administered 2012-02-22: 10 meq via INTRAVENOUS
  Filled 2012-02-22 (×2): qty 100

## 2012-02-22 MED ORDER — INSULIN ASPART 100 UNIT/ML ~~LOC~~ SOLN: 0.0000 [IU] | Freq: Every day | SUBCUTANEOUS | Status: DC

## 2012-02-22 MED ORDER — INSULIN ASPART 100 UNIT/ML ~~LOC~~ SOLN
0.0000 [IU] | Freq: Three times a day (TID) | SUBCUTANEOUS | Status: DC
  Administered 2012-02-23 – 2012-02-25 (×4): 2 [IU] via SUBCUTANEOUS

## 2012-02-22 MED ORDER — POTASSIUM CHLORIDE 10 MEQ/100ML IV SOLN
10.0000 meq | Freq: Once | INTRAVENOUS | Status: AC
  Administered 2012-02-22: 10 meq via INTRAVENOUS
  Filled 2012-02-22: qty 100

## 2012-02-22 NOTE — Progress Notes (Signed)
Oncoming RN staets she will see if patient will allow her to give last postassium

## 2012-02-22 NOTE — Progress Notes (Signed)
Patient pleasant at  This times, her son was called about 0930 this am but has not come to visit at this time. flucuating behavior, no change from yesterday. Reassurance given, and relaxation techniques.

## 2012-02-22 NOTE — Progress Notes (Signed)
Patient had loose stool appears to have some blood, per ICCu report yesterday this is ongoing problem that Dr. Magnus Ivan is aware of

## 2012-02-22 NOTE — Progress Notes (Signed)
Patient refused offered tylenol. Is tearful states she isn't getting better, reassured of progress in care. Patient upset about family visiting. Emotional support given. Patient requests all IVF stopped at this time states her IV hurts, potassium riders infusing. Flushed Iv and blood return received, IV started by IV team a few hours ago, but patient refuses a new IV or to have IVF restarted at this time, will continue to monitor and report as necessary.

## 2012-02-22 NOTE — Progress Notes (Signed)
6 Days Post-Op  Subjective: Confused, but improving. +BM with old blood Tolerating PO  Objective: Vital signs in last 24 hours: Temp:  [98.2 F (36.8 C)-100 F (37.8 C)] 100 F (37.8 C) (09/26 2227) Pulse Rate:  [68-75] 75  (09/26 2227) Resp:  [15-16] 16  (09/26 2227) BP: (150-183)/(80-102) 183/93 mmHg (09/26 2227) SpO2:  [94 %-98 %] 95 % (09/26 2227) Last BM Date: 02/21/12  Intake/Output from previous day: 09/26 0701 - 09/27 0700 In: 702.5 [P.O.:180; I.V.:222.5; IV Piggyback:300] Out: 1665 [Urine:1665] Intake/Output this shift:   Awake and alert, following commands Abdomen soft, wics removed, +purulence, staples removed and wound packed  Lab Results:   Basename 02/22/12 0422 02/20/12 0310  WBC 14.9* 11.7*  HGB 9.8* 9.1*  HCT 29.1* 27.3*  PLT 320 218   BMET  Basename 02/22/12 0422 02/21/12 0315  NA 134* 133*  K 3.2* 2.7*  CL 94* 96  CO2 24 25  GLUCOSE 88 102*  BUN 7 7  CREATININE 0.56 0.52  CALCIUM 8.2* 7.7*   PT/INR No results found for this basename: LABPROT:2,INR:2 in the last 72 hours ABG No results found for this basename: PHART:2,PCO2:2,PO2:2,HCO3:2 in the last 72 hours  Studies/Results: No results found.  Anti-infectives: Anti-infectives     Start     Dose/Rate Route Frequency Ordered Stop   02/16/12 0615   aztreonam (AZACTAM) 500 mg in dextrose 5 % 50 mL IVPB        500 mg 100 mL/hr over 30 Minutes Intravenous  Once 02/16/12 0601 02/16/12 0708   02/15/12 1700   metroNIDAZOLE (FLAGYL) IVPB 500 mg        500 mg 100 mL/hr over 60 Minutes Intravenous Every 8 hours 02/15/12 1335 02/16/12 0632   02/15/12 0744   metroNIDAZOLE (FLAGYL) IVPB 500 mg        500 mg 100 mL/hr over 60 Minutes Intravenous 60 min pre-op 02/15/12 0744 02/15/12 0920          Assessment/Plan: s/p Procedure(s) (LRB) with comments: EXPLORATORY LAPAROTOMY (N/A) - oversewing of anastomotic leak and rigid sigmoidoscopy  Post op wound infection -- start wet to dry  dressing changes  H and H stable  PT  D/C foley  Regular diet  LOS: 7 days    Medardo Hassing A 02/22/2012

## 2012-02-22 NOTE — Progress Notes (Signed)
Patient had previously inquired about something for diarrhea but now states she feels better. Will continue to monitor.

## 2012-02-22 NOTE — Progress Notes (Signed)
Physical Therapy Note  Order received. Chart reviewed. Attempted PT evaluation this a.m. Nursing preparing to assist pt to [facility]. Attempted to assist nursing with transferring pt to assess mobility. Pt refused to allow therapist/tech to assist her to commode. "I can do it. I'm not going to do it with you holding me. Don't touch me.I just won't use the bathroom then." Explained to pt that therapist has to be close by in case she is unable to pivot and/or if she has a loss of balance. Pt continued to refuse assistance. Explained to pt that doctor wants therapy to evaluate her to assess mobility level and level of assistance. Pt stated "the doctor didn't tell you that s#@t ." Asked pt if she would work with therapy, she stated she did not want therapy. Pt refuses to participate safely with therapy. PT will sign off at this time. Please reorder if/when pt is willing to participate. Thanks. Rebeca Alert, PT [phone]

## 2012-02-22 NOTE — Progress Notes (Signed)
Patient attempting to go to [facility]. Refusing any type of help, PT in to see patient and patient upset that they want to help her to [facility]. Lelon Perla, Chiropodist in to assist with patient. Patient lying across the bed and refusing to be straightened up for safety. Patient stating " don't touch me" emotional support and reorientation given, patient more cooperative and daily care given. Will continue to monitor.

## 2012-02-23 LAB — GLUCOSE, CAPILLARY: Glucose-Capillary: 98 mg/dL (ref 70–99)

## 2012-02-23 LAB — CBC
MCH: 30.3 pg (ref 26.0–34.0)
MCV: 89.7 fL (ref 78.0–100.0)
Platelets: 309 10*3/uL (ref 150–400)
RBC: 3 MIL/uL — ABNORMAL LOW (ref 3.87–5.11)
RDW: 14.6 % (ref 11.5–15.5)
WBC: 11.8 10*3/uL — ABNORMAL HIGH (ref 4.0–10.5)

## 2012-02-23 MED ORDER — POTASSIUM CHLORIDE CRYS ER 20 MEQ PO TBCR
40.0000 meq | EXTENDED_RELEASE_TABLET | Freq: Once | ORAL | Status: AC
  Administered 2012-02-23: 40 meq via ORAL
  Filled 2012-02-23: qty 2

## 2012-02-23 NOTE — Progress Notes (Signed)
7 Days Post-Op  Subjective: Confused, but improving. +BM with old blood, no bright red blood Tolerating PO  Objective: Vital signs in last 24 hours: Temp:  [98.7 F (37.1 C)-99.6 F (37.6 C)] 98.9 F (37.2 C) (09/28 0605) Pulse Rate:  [67-78] 78  (09/28 0605) Resp:  [18] 18  (09/28 0605) BP: (135-181)/(80-95) 135/87 mmHg (09/28 0605) SpO2:  [99 %] 99 % (09/28 0605) Last BM Date: 02/21/12  Intake/Output from previous day: 09/27 0701 - 09/28 0700 In: 1350 [P.O.:440; I.V.:810; IV Piggyback:100] Out: 507 [Urine:503; Stool:4] Intake/Output this shift:   Awake and alert, following commands Abdomen soft, wics removed, +purulence, staples removed and wound packed  Lab Results:   Basename 02/23/12 0430 02/22/12 0422  WBC 11.8* 14.9*  HGB 9.1* 9.8*  HCT 26.9* 29.1*  PLT 309 320   BMET  Basename 02/22/12 0422 02/21/12 0315  NA 134* 133*  K 3.2* 2.7*  CL 94* 96  CO2 24 25  GLUCOSE 88 102*  BUN 7 7  CREATININE 0.56 0.52  CALCIUM 8.2* 7.7*    Anti-infectives: Anti-infectives     Start     Dose/Rate Route Frequency Ordered Stop   02/16/12 0615   aztreonam (AZACTAM) 500 mg in dextrose 5 % 50 mL IVPB        500 mg 100 mL/hr over 30 Minutes Intravenous  Once 02/16/12 0601 02/16/12 0708   02/15/12 1700   metroNIDAZOLE (FLAGYL) IVPB 500 mg        500 mg 100 mL/hr over 60 Minutes Intravenous Every 8 hours 02/15/12 1335 02/16/12 0632   02/15/12 0744   metroNIDAZOLE (FLAGYL) IVPB 500 mg        500 mg 100 mL/hr over 60 Minutes Intravenous 60 min pre-op 02/15/12 0744 02/15/12 0920          Assessment/Plan: s/p Procedure(s) (LRB) with comments: EXPLORATORY LAPAROTOMY (N/A) - oversewing of anastomotic leak and rigid sigmoidoscopy  Post op wound infection -- wet to dry dressing changes, wbc improving  H and H stable  PT  Cont Regular diet  LOS: 8 days    Edythe Riches C. 02/23/2012

## 2012-02-24 LAB — GLUCOSE, CAPILLARY: Glucose-Capillary: 105 mg/dL — ABNORMAL HIGH (ref 70–99)

## 2012-02-24 NOTE — Progress Notes (Signed)
8 Days Post-Op  Subjective: Confused, but improving. +BM with old blood, no bright red blood Tolerating PO  Objective: Vital signs in last 24 hours: Temp:  [99.4 F (37.4 C)-99.6 F (37.6 C)] 99.4 F (37.4 C) (09/29 0523) Pulse Rate:  [74-82] 82  (09/29 0523) Resp:  [18-20] 20  (09/29 0523) BP: (154-177)/(82-96) 154/94 mmHg (09/29 0523) SpO2:  [97 %-98 %] 97 % (09/29 0523) Last BM Date: 02/21/12  Intake/Output from previous day: 09/28 0701 - 09/29 0700 In: 2650 [P.O.:300; I.V.:2150; IV Piggyback:200] Out: 600 [Urine:600] Intake/Output this shift:   Awake and alert, following commands Abdomen soft,  +purulence, wound packed  Lab Results:   Basename 02/23/12 0430 02/22/12 0422  WBC 11.8* 14.9*  HGB 9.1* 9.8*  HCT 26.9* 29.1*  PLT 309 320   BMET  Basename 02/22/12 0422  NA 134*  K 3.2*  CL 94*  CO2 24  GLUCOSE 88  BUN 7  CREATININE 0.56  CALCIUM 8.2*    Anti-infectives: Anti-infectives     Start     Dose/Rate Route Frequency Ordered Stop   02/16/12 0615   aztreonam (AZACTAM) 500 mg in dextrose 5 % 50 mL IVPB        500 mg 100 mL/hr over 30 Minutes Intravenous  Once 02/16/12 0601 02/16/12 0708   02/15/12 1700   metroNIDAZOLE (FLAGYL) IVPB 500 mg        500 mg 100 mL/hr over 60 Minutes Intravenous Every 8 hours 02/15/12 1335 02/16/12 0632   02/15/12 0744   metroNIDAZOLE (FLAGYL) IVPB 500 mg        500 mg 100 mL/hr over 60 Minutes Intravenous 60 min pre-op 02/15/12 0744 02/15/12 0920          Assessment/Plan: s/p Procedure(s) (LRB) with comments: EXPLORATORY LAPAROTOMY (N/A) - oversewing of anastomotic leak and rigid sigmoidoscopy  Post op wound infection -- wet to dry dressing changes  H and H stable- recheck tom  PT  Cont Regular diet  LOS: 9 days    Caci Orren C. 02/24/2012

## 2012-02-25 LAB — BASIC METABOLIC PANEL
BUN: 4 mg/dL — ABNORMAL LOW (ref 6–23)
CO2: 25 mEq/L (ref 19–32)
Chloride: 96 mEq/L (ref 96–112)
Creatinine, Ser: 0.63 mg/dL (ref 0.50–1.10)

## 2012-02-25 LAB — CBC
HCT: 28.1 % — ABNORMAL LOW (ref 36.0–46.0)
MCV: 90.9 fL (ref 78.0–100.0)
RBC: 3.09 MIL/uL — ABNORMAL LOW (ref 3.87–5.11)
RDW: 14.4 % (ref 11.5–15.5)
WBC: 10.2 10*3/uL (ref 4.0–10.5)

## 2012-02-25 LAB — GLUCOSE, CAPILLARY
Glucose-Capillary: 111 mg/dL — ABNORMAL HIGH (ref 70–99)
Glucose-Capillary: 134 mg/dL — ABNORMAL HIGH (ref 70–99)
Glucose-Capillary: 146 mg/dL — ABNORMAL HIGH (ref 70–99)
Glucose-Capillary: 100 mg/dL — ABNORMAL HIGH (ref 70–99)

## 2012-02-25 NOTE — Progress Notes (Signed)
9 Days Post-Op  Subjective: Doing much better after the weekend Mental status seems back to baseline Poor po intake  Objective: Vital signs in last 24 hours: Temp:  [98.2 F (36.8 C)-100.1 F (37.8 C)] 100.1 F (37.8 C) (09/30 0337) Pulse Rate:  [80-83] 83  (09/30 0337) Resp:  [16-18] 16  (09/30 0337) BP: (149-170)/(86-92) 149/86 mmHg (09/30 0337) SpO2:  [94 %-98 %] 94 % (09/30 0337) Last BM Date: 02/21/12  Intake/Output from previous day: 09/29 0701 - 09/30 0700 In: 907 [P.O.:240; I.V.:567; IV Piggyback:100] Out: 1600 [Urine:1600] Intake/Output this shift: Total I/O In: -  Out: 225 [Urine:225]  Abdomen soft Wound still with drainage  Lab Results:   Basename 02/25/12 0405 02/23/12 0430  WBC 10.2 11.8*  HGB 9.3* 9.1*  HCT 28.1* 26.9*  PLT 371 309   BMET  Basename 02/25/12 0405  NA 129*  K 4.4  CL 96  CO2 25  GLUCOSE 122*  BUN 4*  CREATININE 0.63  CALCIUM 8.4   PT/INR No results found for this basename: LABPROT:2,INR:2 in the last 72 hours ABG No results found for this basename: PHART:2,PCO2:2,PO2:2,HCO3:2 in the last 72 hours  Studies/Results: No results found.  Anti-infectives: Anti-infectives     Start     Dose/Rate Route Frequency Ordered Stop   02/16/12 0615   aztreonam (AZACTAM) 500 mg in dextrose 5 % 50 mL IVPB        500 mg 100 mL/hr over 30 Minutes Intravenous  Once 02/16/12 0601 02/16/12 0708   02/15/12 1700   metroNIDAZOLE (FLAGYL) IVPB 500 mg        500 mg 100 mL/hr over 60 Minutes Intravenous Every 8 hours 02/15/12 1335 02/16/12 0632   02/15/12 0744   metroNIDAZOLE (FLAGYL) IVPB 500 mg        500 mg 100 mL/hr over 60 Minutes Intravenous 60 min pre-op 02/15/12 0744 02/15/12 0920          Assessment/Plan: s/p Procedure(s) (LRB) with comments: EXPLORATORY LAPAROTOMY (N/A) - oversewing of anastomotic leak and rigid sigmoidoscopy  Post op wound infection.  Continue wound care Encourage PO PT WBC back to normal and Hgb/Hct  stable  Home later this week with Home Health wound care  LOS: 10 days    Price Lachapelle A 02/25/2012

## 2012-02-26 LAB — TYPE AND SCREEN
ABO/RH(D): AB POS
Antibody Screen: NEGATIVE
Unit division: 0
Unit division: 0

## 2012-02-26 LAB — GLUCOSE, CAPILLARY
Glucose-Capillary: 156 mg/dL — ABNORMAL HIGH (ref 70–99)
Glucose-Capillary: 100 mg/dL — ABNORMAL HIGH (ref 70–99)

## 2012-02-26 MED ORDER — FAMOTIDINE 20 MG PO TABS
20.0000 mg | ORAL_TABLET | Freq: Two times a day (BID) | ORAL | Status: DC
  Administered 2012-02-27 (×2): 20 mg via ORAL
  Filled 2012-02-26 (×8): qty 1

## 2012-02-26 MED ORDER — HYDROMORPHONE HCL 2 MG PO TABS
2.0000 mg | ORAL_TABLET | ORAL | Status: DC | PRN
  Administered 2012-02-26 – 2012-02-27 (×4): 2 mg via ORAL
  Filled 2012-02-26 (×4): qty 1

## 2012-02-26 NOTE — Progress Notes (Signed)
10 Days Post-Op  Subjective: Complains of abdominal pain No BM since weekend  Objective: Vital signs in last 24 hours: Temp:  [98.9 F (37.2 C)] 98.9 F (37.2 C) (09/30 2230) Pulse Rate:  [74-85] 85  (09/30 2230) Resp:  [18] 18  (09/30 2230) BP: (144-147)/(79-80) 147/80 mmHg (09/30 2230) SpO2:  [97 %] 97 % (09/30 2230) Last BM Date: 02/21/12  Intake/Output from previous day: 09/30 0701 - 10/01 0700 In: 1160 [P.O.:920; I.V.:190; IV Piggyback:50] Out: 1675 [Urine:1675] Intake/Output this shift:    Comfortable Abdomen non distended, soft, mildly tender, incision clean  Lab Results:   Basename 02/25/12 0405  WBC 10.2  HGB 9.3*  HCT 28.1*  PLT 371   BMET  Basename 02/25/12 0405  NA 129*  K 4.4  CL 96  CO2 25  GLUCOSE 122*  BUN 4*  CREATININE 0.63  CALCIUM 8.4   PT/INR No results found for this basename: LABPROT:2,INR:2 in the last 72 hours ABG No results found for this basename: PHART:2,PCO2:2,PO2:2,HCO3:2 in the last 72 hours  Studies/Results: No results found.  Anti-infectives: Anti-infectives     Start     Dose/Rate Route Frequency Ordered Stop   02/16/12 0615   aztreonam (AZACTAM) 500 mg in dextrose 5 % 50 mL IVPB        500 mg 100 mL/hr over 30 Minutes Intravenous  Once 02/16/12 0601 02/16/12 0708   02/15/12 1700   metroNIDAZOLE (FLAGYL) IVPB 500 mg        500 mg 100 mL/hr over 60 Minutes Intravenous Every 8 hours 02/15/12 1335 02/16/12 0632   02/15/12 0744   metroNIDAZOLE (FLAGYL) IVPB 500 mg        500 mg 100 mL/hr over 60 Minutes Intravenous 60 min pre-op 02/15/12 0744 02/15/12 0920          Assessment/Plan: s/p Procedure(s) (LRB) with comments: EXPLORATORY LAPAROTOMY (N/A) - oversewing of anastomotic leak and rigid sigmoidoscopy  Hgb/Hct remains stable.  No further signs of bleeding from anastomosis.  Would like to see a normal bowel movement prior to discharge  Mental status seems about at baseline.  Patient lives alone.  Refuses  rehab/SNF  Will try to start arranging home health for discharge needs, wound care  Po intake remains poor.  Will encourage  Plan discharge 10/3  LOS: 11 days    Amillia Biffle A 02/26/2012

## 2012-02-26 NOTE — Progress Notes (Addendum)
I was on the floor and nurses were concerned about drainage from wound and the patient complaining of increased abdominal pain..  Dr. Magnus Ivan was off this afternoon.  [name] is 10 days from a laparotomy by Dr. Johna Sheriff to control bleeding from a colonic anastomosis.  BP 150/71  Pulse 76  Temp 98.9 F (37.2 C) (Oral)  Resp 18  Ht 5' (1.524 m)  Wt 176 lb 9.4 oz (80.1 kg)  BMI 34.49 kg/m2  SpO2 97%  She has an open wound involving the bottom 1/4 of the wound.  There is drainage and tunneling going cranially about 12 cm.  I opened this up and the bedside and cleaned it up.  I did not culture the wound.  I then repacked the wound with saline damp Curlex.  I will increase dressing frequency to TID.  It would be good to get her up to the shower, but she has a left AKA.  The patient, though, is excited about trying the shower, so we will try.  Ovidio Kin, MD, [facility] Pager: [phone] Office phone:  [phone]

## 2012-02-26 NOTE — Care Management Note (Signed)
    Page 1 of 2   02/28/2012     1:56:56 PM   CARE MANAGEMENT NOTE 02/28/2012  Patient:  MARCA, GADSBY   Account Number:  [id number]  Date Initiated:  02/18/2012  Documentation initiated by:  DAVIS,RHONDA  Subjective/Objective Assessment:   patient with post op stay after colectomy, active bleeding post op and required prbc x4 units,htn-hydralizline added, monitoring of cardiac and vs.     Action/Plan:   from home, arrange for [facility] services, has full time caregiver   Anticipated DC Date:  02/28/2012   Anticipated DC Plan:  HOME W HOME HEALTH SERVICES  In-house referral  NA      DC Planning Services  CM consult      [facility]  HOME HEALTH   Choice offered to / List presented to:  C-1 Patient   DME arranged  NA      DME agency  NA     HH arranged  HH-2 PT  HH-1 RN  HH-8 PCS/PERSONAL CARE SERVICES      HH agency  [facility]   Status of service:  Completed, signed off Medicare Important Message given?  NA - LOS <3 / Initial given by admissions (If response is "NO", the following Medicare IM given date fields will be blank) Date Medicare IM given:   Date Additional Medicare IM given:    Discharge Disposition:  HOME W HOME HEALTH SERVICES  Per UR Regulation:  Reviewed for med. necessity/level of care/duration of stay  If discussed at Long Length of Stay Meetings, dates discussed:   02/26/2012    Comments:  02-26-12 Lorenda Ishihara RN CM 1430 Patient will need continued [facility] care for wound and PT/OT. Patient is agreeable and wanted me to speak with son prior to arranging. Several attempts made to contact son but unable to speak with him. Patient today states she wants to use [facility] services and thinks she has used them in the past, post amputation. She denies any DME needs at this time, has prosthesis at home. Contacted [facility] to arrange with anticipated d/c of 10/2. Patient complaining of increased abd pain, has not had BM for several days, poor po intake.  D/c pending resolution of these issues. Also awaiting HH orders to be finalized.  02-21-12 Lorenda Ishihara RN CM 1330 Transferred out of ICU to medical floor. Awaiting PT evaluations  [id number] Earlene Plater, RN, BSN, CCM: CHART REVIEWED AND UPDATED. NO DISCHARGE NEEDS PRESENT AT THIS TIME. CASE MANAGEMENT [phone]

## 2012-02-27 ENCOUNTER — Inpatient Hospital Stay (HOSPITAL_COMMUNITY): Payer: Medicare Other

## 2012-02-27 LAB — COMPREHENSIVE METABOLIC PANEL
ALT: 20 U/L (ref 0–35)
AST: 27 U/L (ref 0–37)
Alkaline Phosphatase: 62 U/L (ref 39–117)
CO2: 29 mEq/L (ref 19–32)
Calcium: 8.7 mg/dL (ref 8.4–10.5)
Chloride: 93 mEq/L — ABNORMAL LOW (ref 96–112)
GFR calc Af Amer: >90 mL/min (ref >90)
GFR calc non Af Amer: >90 mL/min (ref >90)
Glucose, Bld: 103 mg/dL — ABNORMAL HIGH (ref 70–99)
Sodium: 128 mEq/L — ABNORMAL LOW (ref 135–145)
Total Bilirubin: 0.5 mg/dL (ref 0.3–1.2)

## 2012-02-27 LAB — CBC
Hemoglobin: 9.8 g/dL — ABNORMAL LOW (ref 12.0–15.0)
MCH: 29.5 pg (ref 26.0–34.0)
Platelets: 448 10*3/uL — ABNORMAL HIGH (ref 150–400)
RBC: 3.32 MIL/uL — ABNORMAL LOW (ref 3.87–5.11)
WBC: 10.7 10*3/uL — ABNORMAL HIGH (ref 4.0–10.5)

## 2012-02-27 NOTE — Progress Notes (Signed)
11 Days Post-Op  Subjective: Complains of increasing abdominal pain, slight nausea No flatus or BM for several days  Objective: Vital signs in last 24 hours: Temp:  [98.9 F (37.2 C)-99.5 F (37.5 C)] 99.3 F (37.4 C) (10/02 0542) Pulse Rate:  [76-77] 77  (10/02 0542) Resp:  [18] 18  (10/02 0542) BP: (124-150)/(71-80) 143/80 mmHg (10/02 1141) SpO2:  [96 %-99 %] 96 % (10/02 0542) Last BM Date: 02/21/12  Intake/Output from previous day: 10/01 0701 - 10/02 0700 In: 1320 [P.O.:1320] Out: 1750 [Urine:1750] Intake/Output this shift: Total I/O In: -  Out: 425 [Urine:425]  Abdomen soft, non-distended, incision stable  Lab Results:   Basename 02/25/12 0405  WBC 10.2  HGB 9.3*  HCT 28.1*  PLT 371   BMET  Basename 02/25/12 0405  NA 129*  K 4.4  CL 96  CO2 25  GLUCOSE 122*  BUN 4*  CREATININE 0.63  CALCIUM 8.4   PT/INR No results found for this basename: LABPROT:2,INR:2 in the last 72 hours ABG No results found for this basename: PHART:2,PCO2:2,PO2:2,HCO3:2 in the last 72 hours  Studies/Results: No results found.  Anti-infectives: Anti-infectives     Start     Dose/Rate Route Frequency Ordered Stop   02/16/12 0615   aztreonam (AZACTAM) 500 mg in dextrose 5 % 50 mL IVPB        500 mg 100 mL/hr over 30 Minutes Intravenous  Once 02/16/12 0601 02/16/12 0708   02/15/12 1700   metroNIDAZOLE (FLAGYL) IVPB 500 mg        500 mg 100 mL/hr over 60 Minutes Intravenous Every 8 hours 02/15/12 1335 02/16/12 9147   02/15/12 0744   metroNIDAZOLE (FLAGYL) IVPB 500 mg        500 mg 100 mL/hr over 60 Minutes Intravenous 60 min pre-op 02/15/12 0744 02/15/12 0920          Assessment/Plan: s/p Procedure(s) (LRB) with comments: EXPLORATORY LAPAROTOMY (N/A) - oversewing of anastomotic leak and rigid sigmoidoscopy  Will check KUB to see bowel gas pattern and check labs.  Possible developing ileus.  LOS: 12 days    [name] A 02/27/2012

## 2012-02-27 NOTE — Progress Notes (Signed)
Late Note - Patient upset with Student nurse, did not want to have student nurse for remaining of day. Calmed patient and assisted patient with lunch. After assisted patient to [facility] where she was successful in having a BM, Bed  Changed. Spoke with patient about the importance of having abd x-ray, patient agreed to have it done. Radiology notified to come back . Portable x-ray done.

## 2012-02-27 NOTE — Progress Notes (Signed)
Radiology up to preform portable chest xray, patient refused. Explained to patient importance,but patient still refused.C.Shelton Soler,RN

## 2012-02-28 MED ORDER — TRAMADOL HCL 50 MG PO TABS: 100.0000 mg | ORAL_TABLET | Freq: Two times a day (BID) | ORAL | Status: DC | PRN

## 2012-02-28 NOTE — Progress Notes (Signed)
12 Days Post-Op  Subjective: No complaints this morning Refused abdominal xray yesterday +flatus Tolerating po  Objective: Vital signs in last 24 hours: Temp:  [99.6 F (37.6 C)] 99.6 F (37.6 C) (10/02 1400) Pulse Rate:  [72] 72  (10/02 1400) Resp:  [16] 16  (10/02 1400) BP: (143-144)/(80-83) 144/83 mmHg (10/02 1400) SpO2:  [96 %] 96 % (10/02 1400) Last BM Date: 02/21/12  Intake/Output from previous day: 10/02 0701 - 10/03 0700 In: -  Out: 1425 [Urine:1425] Intake/Output this shift: Total I/O In: -  Out: 500 [Urine:500]  Abdomen soft, non distended, minimally tender Wound stable  Lab Results:   Basename 02/27/12 1420  WBC 10.7*  HGB 9.8*  HCT 30.0*  PLT 448*   BMET  Basename 02/27/12 1420  NA 128*  K 4.9  CL 93*  CO2 29  GLUCOSE 103*  BUN 5*  CREATININE 0.61  CALCIUM 8.7   PT/INR No results found for this basename: LABPROT:2,INR:2 in the last 72 hours ABG No results found for this basename: PHART:2,PCO2:2,PO2:2,HCO3:2 in the last 72 hours  Studies/Results: Dg Abd Portable 1v  02/27/2012  *RADIOLOGY REPORT*  Clinical Data: Abdominal pain  PORTABLE ABDOMEN - 1 VIEW  Comparison: CT scan from 12/23/2011.  Findings: Supine abdomen shows no gaseous bowel dilatation to suggest obstruction.  Air is seen scattered along segments of a nondilated colon.  No evidence for gaseous small bowel dilatation. Visualized bony structures are unremarkable.  IMPRESSION: No evidence for bowel obstruction.   Original Report Authenticated By: ERIC A. MANSELL, M.D.     Anti-infectives: Anti-infectives     Start     Dose/Rate Route Frequency Ordered Stop   02/16/12 0615   aztreonam (AZACTAM) 500 mg in dextrose 5 % 50 mL IVPB        500 mg 100 mL/hr over 30 Minutes Intravenous  Once 02/16/12 0601 02/16/12 0708   02/15/12 1700   metroNIDAZOLE (FLAGYL) IVPB 500 mg        500 mg 100 mL/hr over 60 Minutes Intravenous Every 8 hours 02/15/12 1335 02/16/12 0632   02/15/12 0744    metroNIDAZOLE (FLAGYL) IVPB 500 mg        500 mg 100 mL/hr over 60 Minutes Intravenous 60 min pre-op 02/15/12 0744 02/15/12 0920          Assessment/Plan: s/p Procedure(s) (LRB) with comments: EXPLORATORY LAPAROTOMY (N/A) - oversewing of anastomotic leak and rigid sigmoidoscopy  Discharge home  LOS: 13 days    [name] A 02/28/2012

## 2012-02-28 NOTE — Progress Notes (Signed)
Assessment unchanged. Verbalized understanding of dc instructions. Scripts and AVS given. Pt instructed how to access MyChart. Discharged via wheelchair to front entrance to meet awaiting vehicle to carry home. Accompanied by NT and caregiver. [facility] to follow pt post-d/c.

## 2012-02-29 ENCOUNTER — Encounter (INDEPENDENT_AMBULATORY_CARE_PROVIDER_SITE_OTHER): Payer: Self-pay | Admitting: Surgery

## 2012-02-29 ENCOUNTER — Telehealth (INDEPENDENT_AMBULATORY_CARE_PROVIDER_SITE_OTHER): Payer: Self-pay

## 2012-02-29 ENCOUNTER — Ambulatory Visit (INDEPENDENT_AMBULATORY_CARE_PROVIDER_SITE_OTHER): Payer: Medicare Other | Admitting: Surgery

## 2012-02-29 ENCOUNTER — Encounter (INDEPENDENT_AMBULATORY_CARE_PROVIDER_SITE_OTHER): Payer: Medicare Other | Admitting: Surgery

## 2012-02-29 VITALS — BP 160/90 | HR 76 | Temp 97.7°F | Resp 16

## 2012-02-29 DIAGNOSIS — Z48815 Encounter for surgical aftercare following surgery on the digestive system: Secondary | ICD-10-CM | POA: Diagnosis not present

## 2012-02-29 DIAGNOSIS — R413 Other amnesia: Secondary | ICD-10-CM | POA: Diagnosis not present

## 2012-02-29 DIAGNOSIS — K219 Gastro-esophageal reflux disease without esophagitis: Secondary | ICD-10-CM | POA: Diagnosis not present

## 2012-02-29 DIAGNOSIS — Z09 Encounter for follow-up examination after completed treatment for conditions other than malignant neoplasm: Secondary | ICD-10-CM

## 2012-02-29 DIAGNOSIS — G8918 Other acute postprocedural pain: Secondary | ICD-10-CM | POA: Diagnosis not present

## 2012-02-29 DIAGNOSIS — J45909 Unspecified asthma, uncomplicated: Secondary | ICD-10-CM | POA: Diagnosis not present

## 2012-02-29 DIAGNOSIS — I10 Essential (primary) hypertension: Secondary | ICD-10-CM | POA: Diagnosis not present

## 2012-02-29 NOTE — Progress Notes (Signed)
Subjective:     Patient ID: [name], female   DOB: [birth year], 61 y.o.   MRN: [id number]  HPI  She was just discharged from the hospital yesterday with home health doing wet to dry dressing changes. They were concerned because of the odor from her deep abdominal wound. She is passing flatus and has no fevers. Review of Systems     Objective:   Physical Exam On exam, the edges of the wound showed good cannulation tissue. There is necrotic debris deep in the wound but no stool or evidence of fistula.    Assessment:     Postop wound infection.    Plan:     Because of her mental status issue, she is somewhat difficult to talk to  And to take care of. She refused nursing home placement while in the hospital. We will notify home health to try and switch to a wound VAC to see if this will help. I will see her back in one week

## 2012-02-29 NOTE — Discharge Summary (Signed)
Physician Discharge Summary  Patient ID: [name] MRN: [id number] DOB/AGE: 1950-08-11 61 y.o.  Admit date: 02/15/2012 Discharge date: 02/29/2012  Admission Diagnoses: history of GI bleed  Discharge Diagnoses: same    anastomostic bleeding with acute post op blood loss anemia    Mental status changes Active Problems:  * No active hospital problems. *    Discharged Condition: fair  Hospital Course: She was admitted for elective partial colectomy secondary to a history of gastrointestinal bleeding. She underwent a partial colectomy and then developed bleeding postoperatively. After 24 hours, she had persistent acute blood loss anemia which is felt to be from the anastomosis. She was transfused packed red blood cells and fresh frozen plasma and taken back to the operating room for oversewing of her anastomosis. This appeared to control the bleeding. She developed mental status changes postoperatively. CAT scan of her head was unremarkable. Her mental status changes were stall to be secondary to withdrawal from her multiple medications. She was initially in the intensive care unit but was following able to be transferred to the floor where her mental status improved. She developed a postoperative wound infection and had to have her staples removed in and start undergoing wet-to-dry dressing changes. Her diet was slowly advanced. By the time of discharge, she was back to her mental status Baseline and tolerating a diet. She refused nursing home placement and was thus discharged with home health referral  Consults: None  Significant Diagnostic Studies: labs:   Treatments: surgery:   Discharge Exam: Blood pressure 122/78, pulse 82, temperature 100 F (37.8 C), temperature source Oral, resp. rate 18, height 5' (1.524 m), weight 176 lb 9.4 oz (80.1 kg), SpO2 95.00%. General appearance: alert and no distress Resp: clear to auscultation bilaterally Cardio: regular rate and rhythm, S1, S2  normal, no murmur, click, rub or gallop Incision/Wound: open abdominal wound with packing  Disposition: 01-Home or Self Care  Discharge Orders    Future Appointments: Provider: Department: Dept Phone: Center:   03/10/2012 8:15 AM Dava Najjar Idelle Jo [facility] [phone] None   03/12/2012 9:40 AM Shelly Rubenstein, MD [facility] [phone] None   03/31/2012 10:30 AM Dava Najjar Idelle Jo [facility] [phone] None   03/31/2012 11:00 AM Samul Dada, MD [facility] [phone] None     Future Orders Please Complete By Expires   Ambulatory referral to Home Health      Comments:   Please evaluate [name] for admission to [facility].  Disciplines requested: Nursing, PT  Services to provide: [facility]  Physician to follow patient's care (the person listed here will be responsible for signing ongoing orders): Referring Provider  Requested Start of Care Date: Tomorrow  Special Instructions:  Wet to dry NS dressing changes       Medication List     As of 02/29/2012  6:17 PM    TAKE these medications         aspirin EC 81 MG tablet   Take 162 mg by mouth 2 (two) times daily.      atenolol 50 MG tablet   Commonly known as: TENORMIN   Take 50 mg by mouth every morning.      cetirizine 10 MG tablet   Commonly known as: ZYRTEC   Take 10 mg by mouth daily.      esomeprazole 40 MG capsule   Commonly known as: NEXIUM   Take 40 mg by mouth daily before breakfast.      FLUoxetine  20 MG capsule   Commonly known as: PROZAC   Take 20 mg by mouth every morning.      gabapentin 300 MG capsule   Commonly known as: NEURONTIN   Take 600 mg by mouth 3 (three) times daily.      hydrochlorothiazide 12.5 MG tablet   Commonly known as: HYDRODIURIL   Take 12.5 mg by mouth every morning.      lisinopril 40 MG tablet   Commonly known as: PRINIVIL,ZESTRIL   Take 40 mg by mouth every morning.      potassium chloride SA 20 MEQ tablet    Commonly known as: K-DUR,KLOR-CON   Take 20 mEq by mouth daily.      traMADol 50 MG tablet   Commonly known as: ULTRAM   Take 50 mg by mouth every 6 (six) hours as needed. For pain.Maximum dose= 8 tablets per day      traMADol 50 MG tablet   Commonly known as: ULTRAM   Take 2 tablets (100 mg total) by mouth every 12 (twelve) hours as needed.      traZODone 100 MG tablet   Commonly known as: DESYREL   Take 100 mg by mouth at bedtime.      vitamin C 500 MG tablet   Commonly known as: ASCORBIC ACID   Take 500 mg by mouth daily.           Follow-up Information    Follow up with [facility] A, MD. Call today. (RN for wound care, PT, OT)    Contact information:   [address] [place] [address] [phone]          Signed: Shelly Rubenstein 02/29/2012, 6:17 PM

## 2012-02-29 NOTE — Telephone Encounter (Signed)
[name] at [facility] wants [name] to be aware when he sees pt this afternoon that pt is refusing to take all of her medications and that pt removed her packing yesterday and did not redress wound. I advised Nelva Bush will be sent to [name] and Marcelino Duster for this afternoons clinic.

## 2012-03-01 DIAGNOSIS — R413 Other amnesia: Secondary | ICD-10-CM | POA: Diagnosis not present

## 2012-03-01 DIAGNOSIS — G8918 Other acute postprocedural pain: Secondary | ICD-10-CM | POA: Diagnosis not present

## 2012-03-01 DIAGNOSIS — J45909 Unspecified asthma, uncomplicated: Secondary | ICD-10-CM | POA: Diagnosis not present

## 2012-03-01 DIAGNOSIS — I10 Essential (primary) hypertension: Secondary | ICD-10-CM | POA: Diagnosis not present

## 2012-03-01 DIAGNOSIS — Z48815 Encounter for surgical aftercare following surgery on the digestive system: Secondary | ICD-10-CM | POA: Diagnosis not present

## 2012-03-01 DIAGNOSIS — K219 Gastro-esophageal reflux disease without esophagitis: Secondary | ICD-10-CM | POA: Diagnosis not present

## 2012-03-02 DIAGNOSIS — Z48815 Encounter for surgical aftercare following surgery on the digestive system: Secondary | ICD-10-CM | POA: Diagnosis not present

## 2012-03-02 DIAGNOSIS — J45909 Unspecified asthma, uncomplicated: Secondary | ICD-10-CM | POA: Diagnosis not present

## 2012-03-02 DIAGNOSIS — R413 Other amnesia: Secondary | ICD-10-CM | POA: Diagnosis not present

## 2012-03-02 DIAGNOSIS — G8918 Other acute postprocedural pain: Secondary | ICD-10-CM | POA: Diagnosis not present

## 2012-03-02 DIAGNOSIS — K219 Gastro-esophageal reflux disease without esophagitis: Secondary | ICD-10-CM | POA: Diagnosis not present

## 2012-03-02 DIAGNOSIS — I10 Essential (primary) hypertension: Secondary | ICD-10-CM | POA: Diagnosis not present

## 2012-03-03 ENCOUNTER — Telehealth: Payer: Self-pay | Admitting: Oncology

## 2012-03-03 ENCOUNTER — Telehealth (INDEPENDENT_AMBULATORY_CARE_PROVIDER_SITE_OTHER): Payer: Self-pay | Admitting: General Surgery

## 2012-03-03 ENCOUNTER — Encounter (INDEPENDENT_AMBULATORY_CARE_PROVIDER_SITE_OTHER): Payer: Self-pay | Admitting: Surgery

## 2012-03-03 DIAGNOSIS — R413 Other amnesia: Secondary | ICD-10-CM | POA: Diagnosis not present

## 2012-03-03 DIAGNOSIS — I10 Essential (primary) hypertension: Secondary | ICD-10-CM | POA: Diagnosis not present

## 2012-03-03 DIAGNOSIS — G8918 Other acute postprocedural pain: Secondary | ICD-10-CM | POA: Diagnosis not present

## 2012-03-03 DIAGNOSIS — Z48815 Encounter for surgical aftercare following surgery on the digestive system: Secondary | ICD-10-CM | POA: Diagnosis not present

## 2012-03-03 DIAGNOSIS — K219 Gastro-esophageal reflux disease without esophagitis: Secondary | ICD-10-CM | POA: Diagnosis not present

## 2012-03-03 DIAGNOSIS — J45909 Unspecified asthma, uncomplicated: Secondary | ICD-10-CM | POA: Diagnosis not present

## 2012-03-03 NOTE — Telephone Encounter (Signed)
Patient aware.

## 2012-03-03 NOTE — Telephone Encounter (Signed)
Pt needs dressing changes every day until vac placed

## 2012-03-03 NOTE — Telephone Encounter (Signed)
Patient calling concerned because she states she was told that the nurses at [facility] are not going to come out to change her dressing until they bring a wound vac out. I called Debbie to verify that that is what is happening. She verified that their orders say to go out twice a week. They went out on Friday and Saturday for last week and Sunday this week and they are not planning on going out again until Wednesday. Should they be going out every day until wound vac gets there? Please clarify orders with [facility]/ patient.

## 2012-03-03 NOTE — Telephone Encounter (Signed)
Spoke with Ambra at [facility] who informed me they will have nurse come for daily dressing changes until wound vac is placed. Diane is scheduled to see the patient today.

## 2012-03-03 NOTE — Telephone Encounter (Signed)
called pt to move appt to later on 11/4 for  a tx pt,pt did not want to talk,moved appt and will mail sch to pt   aom

## 2012-03-04 DIAGNOSIS — G8918 Other acute postprocedural pain: Secondary | ICD-10-CM | POA: Diagnosis not present

## 2012-03-04 DIAGNOSIS — J45909 Unspecified asthma, uncomplicated: Secondary | ICD-10-CM | POA: Diagnosis not present

## 2012-03-04 DIAGNOSIS — K219 Gastro-esophageal reflux disease without esophagitis: Secondary | ICD-10-CM | POA: Diagnosis not present

## 2012-03-04 DIAGNOSIS — R413 Other amnesia: Secondary | ICD-10-CM | POA: Diagnosis not present

## 2012-03-04 DIAGNOSIS — I10 Essential (primary) hypertension: Secondary | ICD-10-CM | POA: Diagnosis not present

## 2012-03-04 DIAGNOSIS — Z48815 Encounter for surgical aftercare following surgery on the digestive system: Secondary | ICD-10-CM | POA: Diagnosis not present

## 2012-03-05 ENCOUNTER — Emergency Department (HOSPITAL_COMMUNITY): Payer: Medicare Other

## 2012-03-05 ENCOUNTER — Emergency Department (HOSPITAL_COMMUNITY)
Admission: EM | Admit: 2012-03-05 | Discharge: 2012-03-05 | Disposition: A | Discharge status: Home / Self Care | Payer: Medicare Other | Attending: Emergency Medicine | Admitting: Emergency Medicine

## 2012-03-05 DIAGNOSIS — Z8673 Personal history of transient ischemic attack (TIA), and cerebral infarction without residual deficits: Secondary | ICD-10-CM | POA: Insufficient documentation

## 2012-03-05 DIAGNOSIS — T819XXA Unspecified complication of procedure, initial encounter: Secondary | ICD-10-CM | POA: Diagnosis not present

## 2012-03-05 DIAGNOSIS — R5383 Other fatigue: Secondary | ICD-10-CM | POA: Diagnosis not present

## 2012-03-05 DIAGNOSIS — T8131XA Disruption of external operation (surgical) wound, not elsewhere classified, initial encounter: Secondary | ICD-10-CM | POA: Diagnosis not present

## 2012-03-05 DIAGNOSIS — Z9889 Other specified postprocedural states: Secondary | ICD-10-CM | POA: Diagnosis not present

## 2012-03-05 DIAGNOSIS — R4182 Altered mental status, unspecified: Secondary | ICD-10-CM

## 2012-03-05 DIAGNOSIS — F29 Unspecified psychosis not due to a substance or known physiological condition: Secondary | ICD-10-CM | POA: Diagnosis not present

## 2012-03-05 DIAGNOSIS — I10 Essential (primary) hypertension: Secondary | ICD-10-CM | POA: Insufficient documentation

## 2012-03-05 DIAGNOSIS — J321 Chronic frontal sinusitis: Secondary | ICD-10-CM | POA: Diagnosis not present

## 2012-03-05 DIAGNOSIS — T8132XA Disruption of internal operation (surgical) wound, not elsewhere classified, initial encounter: Secondary | ICD-10-CM | POA: Diagnosis not present

## 2012-03-05 DIAGNOSIS — Y838 Other surgical procedures as the cause of abnormal reaction of the patient, or of later complication, without mention of misadventure at the time of the procedure: Secondary | ICD-10-CM | POA: Insufficient documentation

## 2012-03-05 DIAGNOSIS — R509 Fever, unspecified: Secondary | ICD-10-CM | POA: Diagnosis not present

## 2012-03-05 DIAGNOSIS — R5381 Other malaise: Secondary | ICD-10-CM | POA: Diagnosis not present

## 2012-03-05 DIAGNOSIS — R109 Unspecified abdominal pain: Secondary | ICD-10-CM | POA: Diagnosis not present

## 2012-03-05 DIAGNOSIS — G40909 Epilepsy, unspecified, not intractable, without status epilepticus: Secondary | ICD-10-CM | POA: Diagnosis not present

## 2012-03-05 DIAGNOSIS — T8130XA Disruption of wound, unspecified, initial encounter: Secondary | ICD-10-CM

## 2012-03-05 LAB — CBC WITH DIFFERENTIAL/PLATELET
Basophils Absolute: 0 10*3/uL (ref 0.0–0.1)
Eosinophils Absolute: 0.1 10*3/uL (ref 0.0–0.7)
Eosinophils Relative: 1 % (ref 0–5)
HCT: 31.4 % — ABNORMAL LOW (ref 36.0–46.0)
Lymphocytes Relative: 21 % (ref 12–46)
MCH: 28.9 pg (ref 26.0–34.0)
MCHC: 32.8 g/dL (ref 30.0–36.0)
MCV: 88.2 fL (ref 78.0–100.0)
Monocytes Absolute: 1.8 10*3/uL — ABNORMAL HIGH (ref 0.1–1.0)
Platelets: 297 10*3/uL (ref 150–400)
RDW: 13.7 % (ref 11.5–15.5)

## 2012-03-05 LAB — RAPID URINE DRUG SCREEN, HOSP PERFORMED
Cocaine: NOT DETECTED
Opiates: NOT DETECTED

## 2012-03-05 LAB — COMPREHENSIVE METABOLIC PANEL
ALT: 36 U/L — ABNORMAL HIGH (ref 0–35)
AST: 55 U/L — ABNORMAL HIGH (ref 0–37)
Alkaline Phosphatase: 52 U/L (ref 39–117)
CO2: 24 mEq/L (ref 19–32)
Calcium: 8.5 mg/dL (ref 8.4–10.5)
Potassium: 3.6 mEq/L (ref 3.5–5.1)
Sodium: 130 mEq/L — ABNORMAL LOW (ref 135–145)
Total Protein: 7 g/dL (ref 6.0–8.3)

## 2012-03-05 LAB — URINALYSIS, ROUTINE W REFLEX MICROSCOPIC
Ketones, ur: NEGATIVE mg/dL
Nitrite: NEGATIVE
Protein, ur: NEGATIVE mg/dL
pH: 5.5 (ref 5.0–8.0)

## 2012-03-05 LAB — URINE MICROSCOPIC-ADD ON

## 2012-03-05 MED ORDER — ACETAMINOPHEN 325 MG PO TABS
650.0000 mg | ORAL_TABLET | Freq: Once | ORAL | Status: AC
  Administered 2012-03-05: 650 mg via ORAL
  Filled 2012-03-05: qty 2

## 2012-03-05 MED ORDER — SODIUM CHLORIDE 0.9 % IV BOLUS (SEPSIS)
1000.0000 mL | Freq: Once | INTRAVENOUS | Status: AC
  Administered 2012-03-05: 1000 mL via INTRAVENOUS

## 2012-03-05 NOTE — Progress Notes (Signed)
Patient ID: [name], female   DOB: 02/27/1951, 61 y.o.   MRN: [id number]    Subjective: Asked to see the patient for her wound.  Apparently it has increased in size since Dr. Magnus Ivan saw it last according to the family.  The patient is confused at time and unable to give a history.  Objective: Vital signs in last 24 hours: Temp:  [97.8 F (36.6 C)] 97.8 F (36.6 C) (10/09 1100) Pulse Rate:  [76] 76  (10/09 1100) Resp:  [22] 22  (10/09 1100) BP: (105)/(62) 105/62 mmHg (10/09 1100) SpO2:  [99 %-100 %] 100 % (10/09 1100)    Intake/Output from previous day:   Intake/Output this shift:    PE: Abd: soft, wound is open and bowel/granulation tissue likely present at base of the wound.  A thin strip of fibrin tissue present, otherwise relatively clean.  Fascial sutures are loose and mostly removed.  Staples removed.  No evidence of infection.  Minimal fibrinous exudate noted.  Lab Results:   Basename 03/05/12 1135  WBC 8.8  HGB 10.3*  HCT 31.4*  PLT 297   BMET  Basename 03/05/12 1135  NA 130*  K 3.6  CL 95*  CO2 24  GLUCOSE 160*  BUN 25*  CREATININE 0.89  CALCIUM 8.5   PT/INR No results found for this basename: LABPROT:2,INR:2 in the last 72 hours CMP     Component Value Date/Time   NA 130* 03/05/2012 1135   K 3.6 03/05/2012 1135   CL 95* 03/05/2012 1135   CO2 24 03/05/2012 1135   GLUCOSE 160* 03/05/2012 1135   BUN 25* 03/05/2012 1135   CREATININE 0.89 03/05/2012 1135   CALCIUM 8.5 03/05/2012 1135   PROT 7.0 03/05/2012 1135   ALBUMIN 2.4* 03/05/2012 1135   AST 55* 03/05/2012 1135   ALT 36* 03/05/2012 1135   ALKPHOS 52 03/05/2012 1135   BILITOT 0.5 03/05/2012 1135   GFRNONAA 70* 03/05/2012 1135   GFRAA 81* 03/05/2012 1135   Lipase     Component Value Date/Time   LIPASE 156* 05/04/2011 1115       Studies/Results: Ct Head Wo Contrast  03/05/2012  *RADIOLOGY REPORT*  Clinical Data: Weakness.  Postop confusion.  CT HEAD WITHOUT CONTRAST  Technique:  Contiguous  axial images were obtained from the base of the skull through the vertex without contrast.  Comparison: CT 02/17/2012  Findings: Mild atrophy.  Small hypodensity sub insular white matter left temporal lobe is unchanged and compatible with chronic ischemia.  No acute infarct.  Negative for hemorrhage or mass.  Visualized sinuses are clear.  No acute skull abnormality.  IMPRESSION: Mild atrophy and mild chronic microvascular ischemia.  No acute abnormality.   Original Report Authenticated By: Camelia Phenes, M.D.    Dg Abd Acute W/chest  03/05/2012  *RADIOLOGY REPORT*  Clinical Data: Recent bowel resection with wound infection. Confusion and weakness.  ACUTE ABDOMEN SERIES (ABDOMEN 2 VIEW & CHEST 1 VIEW)  Comparison: Chest radiograph 02/18/2012.  Findings: Frontal view of the chest shows midline trachea.  Heart size stable.  Minimal scarring at the lung bases.  Lungs are otherwise clear.  Two views of the abdomen show a normal bowel gas pattern.  Surgical skin staples are seen in the midline.  Postoperative changes are seen in the left anatomic pelvis.  IMPRESSION: No acute findings.   Original Report Authenticated By: Reyes Ivan, M.D.     Anti-infectives: Anti-infectives    None  Assessment/Plan  1. Open wound s/p colectomy 2. AMS  Plan: 1. I have evaluated the wound and spoke to Dr. Magnus Ivan about it.  He said to continue WD dressing changes and wait for a VAC to be placed by home health.  The patient has an appointment with Dr. Magnus Ivan next week for follow up.  She does not have any evidence of an infection and does not need any antibiotics.   LOS: 0 days    OSBORNE,KELLY E 03/05/2012, 4:45 PM Pager: [phone]  Agree with above.  Ovidio Kin, MD, [facility] Surgery Pager: [phone] Office phone:  [phone]

## 2012-03-05 NOTE — ED Notes (Signed)
Pt is extremely confused.  "Obama called me today but I was getting in the ambulance so he talked to my niece.  I don't know what they talked about but I am gonna tell him a whole hell of a lot. I ain't scared of his black ass!".  When asked what day it was, pt stated that every day since her surgery was Wednesday.  "every day is Wednesday".  Pt's niece arrived and stated that pt's confusion started today.  Pt was sent home AMA from hospital with wound open.  She was supposed to go to rehab but she refused it.  Since the surgery, [facility].  She was supposed to be having a wound vac at home but has not had one.  Has not been on any abx.

## 2012-03-05 NOTE — Progress Notes (Signed)
No return call from niece nor son at this time. Call from [facility] on call RN loretta to confirm pt is seen by [facility] staff for daily wet to dry dressings since her Home health admission on 02/29/12 and pt has family staying with her at night. Spoke with pt who refuses placement in any facility Pt states she has been seen by [facility] daily "Saturday, Sunday, Monday and Tuesday"  Pt states her niece, Archie Patten, son, Fayrene Fearing and/or his fiance.   Pt states "I am going to be fine going home" Provided her with [facility] contact number to call if any concerns.  CM updated ED RN, Candise Bowens & EDP

## 2012-03-05 NOTE — Progress Notes (Signed)
[facility] ED CM spoke with Diane [facility] wound care RN who last saw pt on Monday.  She reports pt was scheduled to be seen today Wednesday March 05 2012 but Archie Patten called to state pt was in ED. Diane has met son's fiance and began teaching with her on wound care and has spoken with son Sedalia Muta confirms son and his fiance are active caregivers but has just recently met Niece, Archie Patten.  Diane states there had been a request for [employer] to delivery wound vac to [facility] ED. CM spoke with ED RN and checked in pt's room The wound vac has not been delivered to [facility] ED.  Pt refused offered for [facility] order to assist with possible placement if needed Pt is ready for D/c home ED RN updated CM signing off

## 2012-03-05 NOTE — ED Notes (Signed)
Pt to xray

## 2012-03-05 NOTE — ED Notes (Signed)
Niece: Archie Patten [phone] Son: Fayrene Fearing [phone] or [phone]

## 2012-03-05 NOTE — ED Notes (Signed)
[name], son, telephone number # [phone]--- pick up phone but states he can not pick up pt.

## 2012-03-05 NOTE — Progress Notes (Signed)
CSW received call that patient family was not answering call regarding pt discharge. CSW called patient son who stated that he was at home and was talking to pt on the other phone. Pt and pt son requested Non emergency ambulance transportation. .Patient transportation provided by [employer] [facility] with patient [employer]. .No further Clinical Social Work needs, signing off.   Catha Gosselin, LCSWA  [phone] .03/05/2012 8:41pm

## 2012-03-05 NOTE — ED Provider Notes (Signed)
History     CSN: [id number]  Arrival date & time 03/05/12  1047   First MD Initiated Contact with Patient 03/05/12 1058      Chief Complaint  Patient presents with  . Weakness  . Post-op Problem    (Consider location/radiation/quality/duration/timing/severity/associated sxs/prior treatment) HPI Comments: 61 y/o female presents to the ED via EMS due to foul odor coming from surgical wound on abdomen. Patient is a level 5 caveat due to altered mental status and delusions. History provided by her son and niece. She had partial colectomy performed by Dr. Lonzo Candy on September 21 for lower GI bleeding. Discharged on 10/3 with wound partially opened. Home health nurse has been coming every other day. Her son states the wound is more open now than it was when she left hospital. States she has been moving her bowels and eating. Admits to increasing confusion. Niece and daughter state she has been talking to Obama, her deceased fiance and parents. She is normally not confused at baseline. She has not been vomiting or complaining of any other symptoms. Niece and son say she appears very weak with on and off hot/cold.  The history is provided by a relative and the patient. The history is limited by the condition of the patient.    Past Medical History  Diagnosis Date  . Hypertension   . Colon polyps   . Stomach problems   . Hypokalemic alkalosis   . Allergic rhinitis   . GERD (gastroesophageal reflux disease)   . Stroke 2010    no residual problems  . Shortness of breath     due to anemia  . Anemia, iron deficiency 05/03/2011    recieves iron infusions  . Anemia   . GI bleeding   . Diverticulitis   . Transfusion history   . Difficulty sleeping     takes trazadone for sleep  . Peripheral vascular disease     Past Surgical History  Procedure Date  . Leg amputation above knee   . Esophagogastroduodenoscopy 05/05/2011    Procedure: ESOPHAGOGASTRODUODENOSCOPY (EGD);  Surgeon: Erick Blinks, MD;  Location: [facility];  Service: Gastroenterology;  Laterality: N/A;  Dr. Rhea Belton will do procedure for Dr. [name] Saturday.  . Esophagogastroduodenoscopy 05/08/2011    Procedure: ESOPHAGOGASTRODUODENOSCOPY (EGD);  Surgeon: Theda Belfast;  Location: [facility];  Service: Endoscopy;  Laterality: N/A;  . Esophagogastroduodenoscopy 06/07/2011    Procedure: ESOPHAGOGASTRODUODENOSCOPY (EGD);  Surgeon: Theda Belfast, MD;  Location: [facility];  Service: Endoscopy;  Laterality: N/A;  . Hot hemostasis 06/07/2011    Procedure: HOT HEMOSTASIS (ARGON PLASMA COAGULATION/BICAP);  Surgeon: Theda Belfast, MD;  Location: [facility];  Service: Endoscopy;  Laterality: N/A;  . Esophagogastroduodenoscopy 12/20/2011    Procedure: ESOPHAGOGASTRODUODENOSCOPY (EGD);  Surgeon: Theda Belfast, MD;  Location: [facility];  Service: Endoscopy;  Laterality: N/A;  . Flexible sigmoidoscopy 12/21/2011    Procedure: FLEXIBLE SIGMOIDOSCOPY;  Surgeon: Theda Belfast, MD;  Location: [facility];  Service: Endoscopy;  Laterality: N/A;  . Carpal tunnel release     rt hand  . Partial colectomy 02/15/2012    Procedure: PARTIAL COLECTOMY;  Surgeon: Shelly Rubenstein, MD;  Location: [facility];  Service: General;  Laterality: N/A;  . Laparotomy 02/16/2012    Procedure: EXPLORATORY LAPAROTOMY;  Surgeon: Mariella Saa, MD;  Location: [facility];  Service: General;  Laterality: N/A;  oversewing of anastomotic leak and rigid sigmoidoscopy    No family history on file.  History  Substance Use  Topics  . Smoking status: Current Some Day Smoker    Types: Cigarettes  . Smokeless tobacco: Never Used  . Alcohol Use: Yes     occasionally    OB History    Grav Para Term Preterm Abortions TAB SAB Ect Mult Living                  Review of Systems  Constitutional: Positive for fever and activity change.  Skin: Positive for wound.  Neurological: Positive for weakness.  Psychiatric/Behavioral: Positive for  hallucinations and confusion.  All other systems reviewed and are negative.    Allergies  Ciprofloxacin; Morphine and related; Penicillins; and Codeine  Home Medications   Current Outpatient Rx  Name Route Sig Dispense Refill  . ASPIRIN EC 81 MG PO TBEC Oral Take 162 mg by mouth 2 (two) times daily.    . ATENOLOL 50 MG PO TABS Oral Take 50 mg by mouth every morning.     Marland Kitchen CETIRIZINE HCL 10 MG PO TABS Oral Take 10 mg by mouth daily.     Marland Kitchen ESOMEPRAZOLE MAGNESIUM 40 MG PO CPDR Oral Take 40 mg by mouth daily before breakfast.    . FLUOXETINE HCL 20 MG PO CAPS Oral Take 20 mg by mouth every morning.     Marland Kitchen GABAPENTIN 300 MG PO CAPS Oral Take 600 mg by mouth 3 (three) times daily.     Marland Kitchen HYDROCHLOROTHIAZIDE 12.5 MG PO TABS Oral Take 12.5 mg by mouth every morning.     Marland Kitchen LISINOPRIL 40 MG PO TABS Oral Take 40 mg by mouth every morning.     Marland Kitchen POTASSIUM CHLORIDE CRYS ER 20 MEQ PO TBCR Oral Take 20 mEq by mouth daily.    . TRAMADOL HCL 50 MG PO TABS Oral Take 50 mg by mouth every 6 (six) hours as needed. For pain.Maximum dose= 8 tablets per day    . TRAMADOL HCL 50 MG PO TABS Oral Take 2 tablets (100 mg total) by mouth every 12 (twelve) hours as needed. 30 tablet 1  . TRAZODONE HCL 100 MG PO TABS Oral Take 100 mg by mouth at bedtime.     Marland Kitchen VITAMIN C 500 MG PO TABS Oral Take 500 mg by mouth daily.      BP 105/62  Pulse 76  Temp 97.8 F (36.6 C) (Oral)  Resp 22  SpO2 100%  Physical Exam  Nursing note reviewed. Constitutional: She appears well-developed and well-nourished. No distress.  HENT:  Head: Normocephalic and atraumatic.  Mouth/Throat: Oropharynx is clear and moist and mucous membranes are normal.  Eyes: Conjunctivae normal and EOM are normal. Pupils are equal, round, and reactive to light.  Neck: Normal range of motion. Neck supple.  Cardiovascular: Normal rate, regular rhythm and normal heart sounds.   Pulmonary/Chest: Effort normal and breath sounds normal. No respiratory  distress. She has no decreased breath sounds.  Abdominal: Soft. Bowel sounds are normal.       Surgical incision opened below umbilicus about 1 inch deep. Packing placed. Foul odor present. No ischemic changes present. No active bleeding. Tender to palpation.  Musculoskeletal: Normal range of motion.       Below leg amputation  Neurological: She is alert. She is disoriented.  Skin: Skin is warm.       Surgical incision opened below umbilicus about 1 inch deep. Packing placed. Foul odor present. No ischemic changes present. No active bleeding. Tender to palpation.  Psychiatric: Her speech is tangential. She is actively  hallucinating. Thought content is delusional. Cognition and memory are impaired. She exhibits abnormal recent memory.    ED Course  Procedures (including critical care time) Results for orders placed during the hospital encounter of 03/05/12  CBC WITH DIFFERENTIAL      Component Value Range   WBC 8.8  4.0 - 10.5 K/uL   RBC 3.56 (*) 3.87 - 5.11 MIL/uL   Hemoglobin 10.3 (*) 12.0 - 15.0 g/dL   HCT 16.1 (*) 09.6 - 04.5 %   MCV 88.2  78.0 - 100.0 fL   MCH 28.9  26.0 - 34.0 pg   MCHC 32.8  30.0 - 36.0 g/dL   RDW 40.9  81.1 - 91.4 %   Platelets 297  150 - 400 K/uL   Neutrophils Relative 57  43 - 77 %   Neutro Abs 5.0  1.7 - 7.7 K/uL   Lymphocytes Relative 21  12 - 46 %   Lymphs Abs 1.8  0.7 - 4.0 K/uL   Monocytes Relative 20 (*) 3 - 12 %   Monocytes Absolute 1.8 (*) 0.1 - 1.0 K/uL   Eosinophils Relative 1  0 - 5 %   Eosinophils Absolute 0.1  0.0 - 0.7 K/uL   Basophils Relative 0  0 - 1 %   Basophils Absolute 0.0  0.0 - 0.1 K/uL  URINALYSIS, ROUTINE W REFLEX MICROSCOPIC      Component Value Range   Color, Urine AMBER (*) YELLOW   APPearance CLOUDY (*) CLEAR   Specific Gravity, Urine 1.017  1.005 - 1.030   pH 5.5  5.0 - 8.0   Glucose, UA NEGATIVE  NEGATIVE mg/dL   Hgb urine dipstick NEGATIVE  NEGATIVE   Bilirubin Urine SMALL (*) NEGATIVE   Ketones, ur NEGATIVE   NEGATIVE mg/dL   Protein, ur NEGATIVE  NEGATIVE mg/dL   Urobilinogen, UA 1.0  0.0 - 1.0 mg/dL   Nitrite NEGATIVE  NEGATIVE   Leukocytes, UA SMALL (*) NEGATIVE  COMPREHENSIVE METABOLIC PANEL      Component Value Range   Sodium 130 (*) 135 - 145 mEq/L   Potassium 3.6  3.5 - 5.1 mEq/L   Chloride 95 (*) 96 - 112 mEq/L   CO2 24  19 - 32 mEq/L   Glucose, Bld 160 (*) 70 - 99 mg/dL   BUN 25 (*) 6 - 23 mg/dL   Creatinine, Ser 7.82  0.50 - 1.10 mg/dL   Calcium 8.5  8.4 - 95.6 mg/dL   Total Protein 7.0  6.0 - 8.3 g/dL   Albumin 2.4 (*) 3.5 - 5.2 g/dL   AST 55 (*) 0 - 37 U/L   ALT 36 (*) 0 - 35 U/L   Alkaline Phosphatase 52  39 - 117 U/L   Total Bilirubin 0.5  0.3 - 1.2 mg/dL   GFR calc non Af Amer 70 (*) >90 mL/min   GFR calc Af Amer 81 (*) >90 mL/min  LACTIC ACID, PLASMA      Component Value Range   Lactic Acid, Venous 2.2  0.5 - 2.2 mmol/L  URINE RAPID DRUG SCREEN (HOSP PERFORMED)      Component Value Range   Opiates NONE DETECTED  NONE DETECTED   Cocaine NONE DETECTED  NONE DETECTED   Benzodiazepines NONE DETECTED  NONE DETECTED   Amphetamines NONE DETECTED  NONE DETECTED   Tetrahydrocannabinol NONE DETECTED  NONE DETECTED   Barbiturates NONE DETECTED  NONE DETECTED  ETHANOL      Component Value Range   Alcohol,  Ethyl (B) <11  0 - 11 mg/dL  URINE MICROSCOPIC-ADD ON      Component Value Range   Squamous Epithelial / LPF FEW (*) RARE   WBC, UA 3-6  <3 WBC/hpf   Bacteria, UA FEW (*) RARE   Urine-Other AMORPHOUS URATES/PHOSPHATES      Labs Reviewed - No data to display Ct Head Wo Contrast  03/05/2012  *RADIOLOGY REPORT*  Clinical Data: Weakness.  Postop confusion.  CT HEAD WITHOUT CONTRAST  Technique:  Contiguous axial images were obtained from the base of the skull through the vertex without contrast.  Comparison: CT 02/17/2012  Findings: Mild atrophy.  Small hypodensity sub insular white matter left temporal lobe is unchanged and compatible with chronic ischemia.  No acute  infarct.  Negative for hemorrhage or mass.  Visualized sinuses are clear.  No acute skull abnormality.  IMPRESSION: Mild atrophy and mild chronic microvascular ischemia.  No acute abnormality.   Original Report Authenticated By: Camelia Phenes, M.D.    Dg Abd Acute W/chest  03/05/2012  *RADIOLOGY REPORT*  Clinical Data: Recent bowel resection with wound infection. Confusion and weakness.  ACUTE ABDOMEN SERIES (ABDOMEN 2 VIEW & CHEST 1 VIEW)  Comparison: Chest radiograph 02/18/2012.  Findings: Frontal view of the chest shows midline trachea.  Heart size stable.  Minimal scarring at the lung bases.  Lungs are otherwise clear.  Two views of the abdomen show a normal bowel gas pattern.  Surgical skin staples are seen in the midline.  Postoperative changes are seen in the left anatomic pelvis.  IMPRESSION: No acute findings.   Original Report Authenticated By: Reyes Ivan, M.D.      1. Wound disruption, post-op, skin   2. Altered mental status   3. Wound dehiscence       MDM  61 y/o female with wound dehiscence. Surgery consulted- they feel as if she does not need further surgery. Also state the wound is not infected and no abx needed. After looking back in patient's chart, her confusion has been present on and off for the past month or so. She has been off her psych medications. Surgery advised to continue with dry packing and discharge home. She is afebrile with no elevated white count. She will receive wound vac at home. Case discussed with Dr. Patria Mane who also evaluated patient and spoke with surgery.       Trevor Mace, PA-C 03/06/12 [phone]

## 2012-03-05 NOTE — ED Notes (Signed)
Surgical PA is in with pt.

## 2012-03-05 NOTE — Progress Notes (Signed)
[facility] ED CM spoke with Bridgette at [facility] at [phone] (office option 4) to review present home care services.  Confirms pt is receiving HHRN for wound care & HHPT is pending Confirmed dates pt was seen are 02/29/12, 03/01/12 03/02/12 Confirmed pt last seen 03/02/12 without indications of when a wound vac is scheduled to be placed.  Cm requested a return call from [facility] on call rn to possibly assist with more information Pending return call from [facility] on cal RN

## 2012-03-05 NOTE — Progress Notes (Signed)
CM paged OSBORNE,KELLY E at Pager: [phone]

## 2012-03-05 NOTE — ED Notes (Signed)
Attempted to contact son, Treana Lacour, who has the pt's key to her house.

## 2012-03-05 NOTE — ED Notes (Signed)
Per EMS: Pt had an intestinal resection done in September.  And now has foul odor coming from the site and has had increased weakness per family.  Dr. Lonzo Candy is her PCP.

## 2012-03-05 NOTE — ED Notes (Signed)
Pt refuses Cardiac Monitoring/ECG

## 2012-03-05 NOTE — ED Notes (Signed)
Pt's niece, Kenney Houseman, telephone number # [phone] or [phone]--- niece is not picking up phone, left message to call [phone].

## 2012-03-05 NOTE — Progress Notes (Signed)
CM placed following calls with attempts to reach family to inquire about home care status, agreed primary care giver Niece: Archie Patten [phone] (also listed in EPIC as pt's mobile number) Left voice message requesting a return call to [facility] ED [phone] Son: Fayrene Fearing [phone] reached mail box but no answer [phone] Left voice message requesting a return call to [facility] ED [phone] to speak with care manager no answer at pt home number [phone]

## 2012-03-05 NOTE — Progress Notes (Signed)
ED Cm spoke with Barnetta Chapel who confirms pt was encouraged to go to snf by [name] but refused [name] is aware from Home health that pt has been refusing services and is scheduled to get a wound vac Has appointment with [facility] man next week.  Earl Gala reports pt family today reports visiting pt at her home

## 2012-03-06 ENCOUNTER — Telehealth (INDEPENDENT_AMBULATORY_CARE_PROVIDER_SITE_OTHER): Payer: Self-pay | Admitting: General Surgery

## 2012-03-06 DIAGNOSIS — Z48815 Encounter for surgical aftercare following surgery on the digestive system: Secondary | ICD-10-CM | POA: Diagnosis not present

## 2012-03-06 DIAGNOSIS — J45909 Unspecified asthma, uncomplicated: Secondary | ICD-10-CM | POA: Diagnosis not present

## 2012-03-06 DIAGNOSIS — K219 Gastro-esophageal reflux disease without esophagitis: Secondary | ICD-10-CM | POA: Diagnosis not present

## 2012-03-06 DIAGNOSIS — I10 Essential (primary) hypertension: Secondary | ICD-10-CM | POA: Diagnosis not present

## 2012-03-06 DIAGNOSIS — R413 Other amnesia: Secondary | ICD-10-CM | POA: Diagnosis not present

## 2012-03-06 DIAGNOSIS — G8918 Other acute postprocedural pain: Secondary | ICD-10-CM | POA: Diagnosis not present

## 2012-03-06 NOTE — Telephone Encounter (Signed)
Called [place] from [facility] vac and they are going to deliver Ms Missy Baksh today to pt home

## 2012-03-07 DIAGNOSIS — I10 Essential (primary) hypertension: Secondary | ICD-10-CM | POA: Diagnosis not present

## 2012-03-07 DIAGNOSIS — J45909 Unspecified asthma, uncomplicated: Secondary | ICD-10-CM | POA: Diagnosis not present

## 2012-03-07 DIAGNOSIS — Z48815 Encounter for surgical aftercare following surgery on the digestive system: Secondary | ICD-10-CM | POA: Diagnosis not present

## 2012-03-07 DIAGNOSIS — K219 Gastro-esophageal reflux disease without esophagitis: Secondary | ICD-10-CM | POA: Diagnosis not present

## 2012-03-07 DIAGNOSIS — G8918 Other acute postprocedural pain: Secondary | ICD-10-CM | POA: Diagnosis not present

## 2012-03-07 DIAGNOSIS — R413 Other amnesia: Secondary | ICD-10-CM | POA: Diagnosis not present

## 2012-03-07 LAB — URINE CULTURE: Colony Count: NO GROWTH

## 2012-03-07 NOTE — ED Provider Notes (Signed)
Medical screening examination/treatment/procedure(s) were conducted as a shared visit with non-physician practitioner(s) and myself.  I personally evaluated the patient during the encounter  The patient is well-appearing.  She does seem to have worsening abdominal wound dehiscent.  I discussed the case with general surgery who evaluated the patient the bedside.  Her remaining sutures and staples were removed.  They recommended wet-to-dry packing changes and they will consider a VAC closure of this as an outpatient.  She does have a history of psychiatric illness.  She has no homicidal or suicidal thoughts.  She does have somewhat bizarre behavior however I do not think this is delirium.  She does not appear to be a threat to herself or to others I do not think she will benefit from psychiatric hospitalization at this time.  Recommended followup with her mental health team and her general surgery team.   I reviewed available ER/hospitalization records thought the EMR   Lyanne Co, MD 03/07/12 1015

## 2012-03-10 ENCOUNTER — Other Ambulatory Visit (HOSPITAL_BASED_OUTPATIENT_CLINIC_OR_DEPARTMENT_OTHER): Payer: Medicare Other | Admitting: Lab

## 2012-03-10 DIAGNOSIS — G8918 Other acute postprocedural pain: Secondary | ICD-10-CM | POA: Diagnosis not present

## 2012-03-10 DIAGNOSIS — R413 Other amnesia: Secondary | ICD-10-CM | POA: Diagnosis not present

## 2012-03-10 DIAGNOSIS — I10 Essential (primary) hypertension: Secondary | ICD-10-CM | POA: Diagnosis not present

## 2012-03-10 DIAGNOSIS — K219 Gastro-esophageal reflux disease without esophagitis: Secondary | ICD-10-CM | POA: Diagnosis not present

## 2012-03-10 DIAGNOSIS — D509 Iron deficiency anemia, unspecified: Secondary | ICD-10-CM

## 2012-03-10 DIAGNOSIS — J45909 Unspecified asthma, uncomplicated: Secondary | ICD-10-CM | POA: Diagnosis not present

## 2012-03-10 DIAGNOSIS — Z48815 Encounter for surgical aftercare following surgery on the digestive system: Secondary | ICD-10-CM | POA: Diagnosis not present

## 2012-03-10 LAB — CBC WITH DIFFERENTIAL/PLATELET
Eosinophils Absolute: 0.2 10*3/uL (ref 0.0–0.5)
MONO#: 2.3 10*3/uL — ABNORMAL HIGH (ref 0.1–0.9)
NEUT#: 11 10*3/uL — ABNORMAL HIGH (ref 1.5–6.5)
RBC: 3.61 10*6/uL — ABNORMAL LOW (ref 3.70–5.45)
RDW: 15.3 % — ABNORMAL HIGH (ref 11.2–14.5)
WBC: 15.5 10*3/uL — ABNORMAL HIGH (ref 3.9–10.3)

## 2012-03-10 LAB — FERRITIN: Ferritin: 446 ng/mL — ABNORMAL HIGH (ref 10–291)

## 2012-03-11 LAB — CULTURE, BLOOD (ROUTINE X 2)

## 2012-03-12 ENCOUNTER — Telehealth (INDEPENDENT_AMBULATORY_CARE_PROVIDER_SITE_OTHER): Payer: Self-pay | Admitting: General Surgery

## 2012-03-12 ENCOUNTER — Ambulatory Visit (INDEPENDENT_AMBULATORY_CARE_PROVIDER_SITE_OTHER): Payer: Medicare Other | Admitting: Surgery

## 2012-03-12 ENCOUNTER — Telehealth (INDEPENDENT_AMBULATORY_CARE_PROVIDER_SITE_OTHER): Payer: Self-pay

## 2012-03-12 DIAGNOSIS — I10 Essential (primary) hypertension: Secondary | ICD-10-CM | POA: Diagnosis not present

## 2012-03-12 DIAGNOSIS — G8918 Other acute postprocedural pain: Secondary | ICD-10-CM | POA: Diagnosis not present

## 2012-03-12 DIAGNOSIS — Z48815 Encounter for surgical aftercare following surgery on the digestive system: Secondary | ICD-10-CM | POA: Diagnosis not present

## 2012-03-12 DIAGNOSIS — R413 Other amnesia: Secondary | ICD-10-CM | POA: Diagnosis not present

## 2012-03-12 DIAGNOSIS — J45909 Unspecified asthma, uncomplicated: Secondary | ICD-10-CM | POA: Diagnosis not present

## 2012-03-12 DIAGNOSIS — K219 Gastro-esophageal reflux disease without esophagitis: Secondary | ICD-10-CM | POA: Diagnosis not present

## 2012-03-12 DIAGNOSIS — Z09 Encounter for follow-up examination after completed treatment for conditions other than malignant neoplasm: Secondary | ICD-10-CM

## 2012-03-12 NOTE — Telephone Encounter (Signed)
[name] is aware of pt care to [facility] ED

## 2012-03-12 NOTE — Telephone Encounter (Signed)
Message copied by Wilder Glade on Wed Mar 12, 2012  1:24 PM ------      Message from: Zacarias Pontes      Created: Wed Mar 12, 2012 11:45 AM       Family member would like a call back to discuss putting pt in 30 day rehab fac..the pt doesn't have anyone to look after her.please call Tonya at [phone] soon as you can

## 2012-03-12 NOTE — Progress Notes (Signed)
[facility] ED Cm received a call from [place], pt's niece Reviewed the conversation CM had with Elease Hashimoto. Explained that pt needs new pcp or [name] to assist with further home health orders including a HHSW order to obtain snf placement from the community. Answered questions for Tonya. Archie Patten will assist with new pcp She wants to start with [place] providers. CM assisted by providing her with 5 doctors listed within [place] [address] from www.medicare.gov

## 2012-03-12 NOTE — Telephone Encounter (Signed)
Got a call from [name] about [name] and she state that we can give a verbal order and I gave a verbal order over the phone and put per [name] for the care of the pt to be in a rehab fac.[name] is going to do the order this afternoon and will call pt family to let them know that she is going to the order and have the son come out tonight and stay with the pt because it may be tomorrow before the pt come go in the rehab fac. [name] was very helpful her call back number is [phone]

## 2012-03-12 NOTE — Telephone Encounter (Signed)
Diane, [facility] nurse [phone]) called into office to report an increase in confusion, home health nurse reports that patient continues to remove her wound vac.  Dee (caregiver) has stayed the last 2 nights with patient but, cannot stay any more this week because she need's to go back to work.  Both Dee and Diane have tried to contact patient son and have not been successful.  Diane, [facility] states the patient has agreed to go to nursing facility.  Diane, [facility] called Kim @ [facility] ER triage nurse [phone] and left a detailed message.  Diane, [facility] will call EMS to have patient transported to [facility] ED for further assessment by physician and case worker.

## 2012-03-12 NOTE — Progress Notes (Signed)
[facility] ED CM received a voice message from [name], [facility] wound RN and [facility] [phone]-pt hs care giver) requesting assistance with getting pt to skilled nursing. CM reviewed all EPIC notes for 03/12/12 At 1618, CM spoke with [name], medical assistant and [name] at [phone]. Informed them that an order is needed for [facility] SW to assist with getting pt to snf from the community if that is pt choice (Calling the ED CM to get pt to snf is not correct).  The pt is not in [facility] ED on 03/12/12.  [name] stated he has encouraged pt/family to come to ED and encouraged them to go to snf during last hospitalization but pt refused.  [name] states [name] is not the pcp.  Cm spoke with [name] to inform her what is needed to assist the pt at the community level for placement.  CM called [name] and left a voice message at [phone] Called to speak to pt and her sister answered at 86 Cm inquired who the pcp is but pt not answering Cm heard pt in back ground stating "docotor, doctor" for the answer Sister recalls [facility].  [facility] [name] was last MD listed on pt pill bottle per sister, [name]. CM and sister concluded pt does not have a present pcp. [name] states she and her other sisters will try to assist pt in obtaining a new pcp. Spoke with [name] in detail about differences in private duty and home health, how long each staff can stay with pt, the importance of continuity of care for wound healing, importance of pcp for home health orders and establishing CAPS that may provide more home services if needed.  Cm answered sister's questions.  CM had reviewed all of this same informatoin with pt during her last [facility] ED visit.  Cm provided sister with CM contact information to attempt to answer further questions. Cm spoke with [name] at [facility] at 1656 to review case and need for order for HHSW to assist pt and that family would  assist with new pcp

## 2012-03-12 NOTE — Telephone Encounter (Signed)
Called Archie Patten back [phone] to let her know that the family member have to call the hospital case managers to get Mrs Roehrich set up to go into rehab fac, per [name]

## 2012-03-14 DIAGNOSIS — G8918 Other acute postprocedural pain: Secondary | ICD-10-CM | POA: Diagnosis not present

## 2012-03-14 DIAGNOSIS — J45909 Unspecified asthma, uncomplicated: Secondary | ICD-10-CM | POA: Diagnosis not present

## 2012-03-14 DIAGNOSIS — K219 Gastro-esophageal reflux disease without esophagitis: Secondary | ICD-10-CM | POA: Diagnosis not present

## 2012-03-14 DIAGNOSIS — I10 Essential (primary) hypertension: Secondary | ICD-10-CM | POA: Diagnosis not present

## 2012-03-14 DIAGNOSIS — Z48815 Encounter for surgical aftercare following surgery on the digestive system: Secondary | ICD-10-CM | POA: Diagnosis not present

## 2012-03-14 DIAGNOSIS — R413 Other amnesia: Secondary | ICD-10-CM | POA: Diagnosis not present

## 2012-03-17 DIAGNOSIS — K219 Gastro-esophageal reflux disease without esophagitis: Secondary | ICD-10-CM | POA: Diagnosis not present

## 2012-03-17 DIAGNOSIS — I10 Essential (primary) hypertension: Secondary | ICD-10-CM | POA: Diagnosis not present

## 2012-03-17 DIAGNOSIS — G8918 Other acute postprocedural pain: Secondary | ICD-10-CM | POA: Diagnosis not present

## 2012-03-17 DIAGNOSIS — Z48815 Encounter for surgical aftercare following surgery on the digestive system: Secondary | ICD-10-CM | POA: Diagnosis not present

## 2012-03-17 DIAGNOSIS — J45909 Unspecified asthma, uncomplicated: Secondary | ICD-10-CM | POA: Diagnosis not present

## 2012-03-17 DIAGNOSIS — R413 Other amnesia: Secondary | ICD-10-CM | POA: Diagnosis not present

## 2012-03-18 ENCOUNTER — Telehealth: Payer: Self-pay | Admitting: Oncology

## 2012-03-18 NOTE — Telephone Encounter (Signed)
S/W pt an gave new time of appt on 11/04. Lab-11:30 MD-12.

## 2012-03-19 DIAGNOSIS — Z48815 Encounter for surgical aftercare following surgery on the digestive system: Secondary | ICD-10-CM | POA: Diagnosis not present

## 2012-03-19 DIAGNOSIS — G8918 Other acute postprocedural pain: Secondary | ICD-10-CM | POA: Diagnosis not present

## 2012-03-19 DIAGNOSIS — J45909 Unspecified asthma, uncomplicated: Secondary | ICD-10-CM | POA: Diagnosis not present

## 2012-03-19 DIAGNOSIS — I10 Essential (primary) hypertension: Secondary | ICD-10-CM | POA: Diagnosis not present

## 2012-03-19 DIAGNOSIS — K219 Gastro-esophageal reflux disease without esophagitis: Secondary | ICD-10-CM | POA: Diagnosis not present

## 2012-03-19 DIAGNOSIS — R413 Other amnesia: Secondary | ICD-10-CM | POA: Diagnosis not present

## 2012-03-20 ENCOUNTER — Other Ambulatory Visit: Payer: Self-pay | Admitting: Oncology

## 2012-03-20 DIAGNOSIS — Z1231 Encounter for screening mammogram for malignant neoplasm of breast: Secondary | ICD-10-CM

## 2012-03-21 ENCOUNTER — Telehealth (INDEPENDENT_AMBULATORY_CARE_PROVIDER_SITE_OTHER): Payer: Self-pay

## 2012-03-21 DIAGNOSIS — Z48815 Encounter for surgical aftercare following surgery on the digestive system: Secondary | ICD-10-CM | POA: Diagnosis not present

## 2012-03-21 DIAGNOSIS — G8918 Other acute postprocedural pain: Secondary | ICD-10-CM | POA: Diagnosis not present

## 2012-03-21 DIAGNOSIS — R413 Other amnesia: Secondary | ICD-10-CM | POA: Diagnosis not present

## 2012-03-21 DIAGNOSIS — K219 Gastro-esophageal reflux disease without esophagitis: Secondary | ICD-10-CM | POA: Diagnosis not present

## 2012-03-21 DIAGNOSIS — I10 Essential (primary) hypertension: Secondary | ICD-10-CM | POA: Diagnosis not present

## 2012-03-21 DIAGNOSIS — J45909 Unspecified asthma, uncomplicated: Secondary | ICD-10-CM | POA: Diagnosis not present

## 2012-03-21 NOTE — Telephone Encounter (Signed)
Diane at [facility] called wanting to give up date on pts status. She states Pt has become more cooperative with wound vac and much more compliant. I advised her I will send msg to [name]  With this update. She will call with any concerns.

## 2012-03-23 DIAGNOSIS — R413 Other amnesia: Secondary | ICD-10-CM | POA: Diagnosis not present

## 2012-03-23 DIAGNOSIS — G8918 Other acute postprocedural pain: Secondary | ICD-10-CM | POA: Diagnosis not present

## 2012-03-23 DIAGNOSIS — K219 Gastro-esophageal reflux disease without esophagitis: Secondary | ICD-10-CM | POA: Diagnosis not present

## 2012-03-23 DIAGNOSIS — I10 Essential (primary) hypertension: Secondary | ICD-10-CM | POA: Diagnosis not present

## 2012-03-23 DIAGNOSIS — Z48815 Encounter for surgical aftercare following surgery on the digestive system: Secondary | ICD-10-CM | POA: Diagnosis not present

## 2012-03-23 DIAGNOSIS — J45909 Unspecified asthma, uncomplicated: Secondary | ICD-10-CM | POA: Diagnosis not present

## 2012-03-24 DIAGNOSIS — K219 Gastro-esophageal reflux disease without esophagitis: Secondary | ICD-10-CM | POA: Diagnosis not present

## 2012-03-24 DIAGNOSIS — J45909 Unspecified asthma, uncomplicated: Secondary | ICD-10-CM | POA: Diagnosis not present

## 2012-03-24 DIAGNOSIS — Z48815 Encounter for surgical aftercare following surgery on the digestive system: Secondary | ICD-10-CM | POA: Diagnosis not present

## 2012-03-24 DIAGNOSIS — I10 Essential (primary) hypertension: Secondary | ICD-10-CM | POA: Diagnosis not present

## 2012-03-24 DIAGNOSIS — G8918 Other acute postprocedural pain: Secondary | ICD-10-CM | POA: Diagnosis not present

## 2012-03-24 DIAGNOSIS — R413 Other amnesia: Secondary | ICD-10-CM | POA: Diagnosis not present

## 2012-03-26 DIAGNOSIS — Z48815 Encounter for surgical aftercare following surgery on the digestive system: Secondary | ICD-10-CM | POA: Diagnosis not present

## 2012-03-26 DIAGNOSIS — I10 Essential (primary) hypertension: Secondary | ICD-10-CM | POA: Diagnosis not present

## 2012-03-26 DIAGNOSIS — K219 Gastro-esophageal reflux disease without esophagitis: Secondary | ICD-10-CM | POA: Diagnosis not present

## 2012-03-26 DIAGNOSIS — G8918 Other acute postprocedural pain: Secondary | ICD-10-CM | POA: Diagnosis not present

## 2012-03-26 DIAGNOSIS — R413 Other amnesia: Secondary | ICD-10-CM | POA: Diagnosis not present

## 2012-03-26 DIAGNOSIS — J45909 Unspecified asthma, uncomplicated: Secondary | ICD-10-CM | POA: Diagnosis not present

## 2012-03-27 ENCOUNTER — Encounter (HOSPITAL_COMMUNITY): Payer: Self-pay | Admitting: *Deleted

## 2012-03-27 ENCOUNTER — Inpatient Hospital Stay (HOSPITAL_COMMUNITY)
Admission: EM | Admit: 2012-03-27 | Discharge: 2012-04-01 | DRG: 643 | Disposition: A | Discharge status: Skilled Nursing Facility | Payer: Medicare Other | Attending: Internal Medicine | Admitting: Internal Medicine

## 2012-03-27 ENCOUNTER — Emergency Department (HOSPITAL_COMMUNITY): Payer: Medicare Other

## 2012-03-27 DIAGNOSIS — G9349 Other encephalopathy: Secondary | ICD-10-CM | POA: Diagnosis present

## 2012-03-27 DIAGNOSIS — D509 Iron deficiency anemia, unspecified: Secondary | ICD-10-CM

## 2012-03-27 DIAGNOSIS — F322 Major depressive disorder, single episode, severe without psychotic features: Secondary | ICD-10-CM | POA: Diagnosis present

## 2012-03-27 DIAGNOSIS — N390 Urinary tract infection, site not specified: Secondary | ICD-10-CM | POA: Diagnosis not present

## 2012-03-27 DIAGNOSIS — F329 Major depressive disorder, single episode, unspecified: Secondary | ICD-10-CM | POA: Diagnosis present

## 2012-03-27 DIAGNOSIS — E871 Hypo-osmolality and hyponatremia: Secondary | ICD-10-CM | POA: Diagnosis not present

## 2012-03-27 DIAGNOSIS — R627 Adult failure to thrive: Secondary | ICD-10-CM | POA: Diagnosis present

## 2012-03-27 DIAGNOSIS — G9340 Encephalopathy, unspecified: Secondary | ICD-10-CM | POA: Diagnosis not present

## 2012-03-27 DIAGNOSIS — E46 Unspecified protein-calorie malnutrition: Secondary | ICD-10-CM | POA: Diagnosis present

## 2012-03-27 DIAGNOSIS — F29 Unspecified psychosis not due to a substance or known physiological condition: Secondary | ICD-10-CM | POA: Diagnosis not present

## 2012-03-27 DIAGNOSIS — E860 Dehydration: Secondary | ICD-10-CM

## 2012-03-27 DIAGNOSIS — R1032 Left lower quadrant pain: Secondary | ICD-10-CM

## 2012-03-27 DIAGNOSIS — I10 Essential (primary) hypertension: Secondary | ICD-10-CM | POA: Diagnosis present

## 2012-03-27 DIAGNOSIS — F17200 Nicotine dependence, unspecified, uncomplicated: Secondary | ICD-10-CM | POA: Diagnosis present

## 2012-03-27 DIAGNOSIS — K625 Hemorrhage of anus and rectum: Secondary | ICD-10-CM | POA: Diagnosis not present

## 2012-03-27 DIAGNOSIS — Z9049 Acquired absence of other specified parts of digestive tract: Secondary | ICD-10-CM | POA: Diagnosis not present

## 2012-03-27 DIAGNOSIS — Z9889 Other specified postprocedural states: Secondary | ICD-10-CM

## 2012-03-27 DIAGNOSIS — F3289 Other specified depressive episodes: Secondary | ICD-10-CM | POA: Diagnosis present

## 2012-03-27 DIAGNOSIS — I119 Hypertensive heart disease without heart failure: Secondary | ICD-10-CM | POA: Diagnosis not present

## 2012-03-27 DIAGNOSIS — E2749 Other adrenocortical insufficiency: Principal | ICD-10-CM | POA: Diagnosis present

## 2012-03-27 DIAGNOSIS — D62 Acute posthemorrhagic anemia: Secondary | ICD-10-CM

## 2012-03-27 DIAGNOSIS — N170 Acute kidney failure with tubular necrosis: Secondary | ICD-10-CM | POA: Diagnosis not present

## 2012-03-27 DIAGNOSIS — S78119A Complete traumatic amputation at level between unspecified hip and knee, initial encounter: Secondary | ICD-10-CM

## 2012-03-27 DIAGNOSIS — B961 Klebsiella pneumoniae [K. pneumoniae] as the cause of diseases classified elsewhere: Secondary | ICD-10-CM | POA: Diagnosis present

## 2012-03-27 DIAGNOSIS — E875 Hyperkalemia: Secondary | ICD-10-CM

## 2012-03-27 DIAGNOSIS — K5792 Diverticulitis of intestine, part unspecified, without perforation or abscess without bleeding: Secondary | ICD-10-CM

## 2012-03-27 DIAGNOSIS — K219 Gastro-esophageal reflux disease without esophagitis: Secondary | ICD-10-CM

## 2012-03-27 DIAGNOSIS — R571 Hypovolemic shock: Secondary | ICD-10-CM

## 2012-03-27 DIAGNOSIS — F05 Delirium due to known physiological condition: Secondary | ICD-10-CM | POA: Diagnosis not present

## 2012-03-27 DIAGNOSIS — B182 Chronic viral hepatitis C: Secondary | ICD-10-CM

## 2012-03-27 DIAGNOSIS — D72829 Elevated white blood cell count, unspecified: Secondary | ICD-10-CM

## 2012-03-27 DIAGNOSIS — G459 Transient cerebral ischemic attack, unspecified: Secondary | ICD-10-CM | POA: Diagnosis not present

## 2012-03-27 DIAGNOSIS — D500 Iron deficiency anemia secondary to blood loss (chronic): Secondary | ICD-10-CM | POA: Diagnosis present

## 2012-03-27 DIAGNOSIS — N179 Acute kidney failure, unspecified: Secondary | ICD-10-CM

## 2012-03-27 DIAGNOSIS — F489 Nonpsychotic mental disorder, unspecified: Secondary | ICD-10-CM | POA: Diagnosis not present

## 2012-03-27 DIAGNOSIS — A4159 Other Gram-negative sepsis: Secondary | ICD-10-CM | POA: Diagnosis not present

## 2012-03-27 DIAGNOSIS — E876 Hypokalemia: Secondary | ICD-10-CM

## 2012-03-27 DIAGNOSIS — R4182 Altered mental status, unspecified: Secondary | ICD-10-CM

## 2012-03-27 DIAGNOSIS — N178 Other acute kidney failure: Secondary | ICD-10-CM | POA: Diagnosis not present

## 2012-03-27 DIAGNOSIS — R5381 Other malaise: Secondary | ICD-10-CM | POA: Diagnosis present

## 2012-03-27 DIAGNOSIS — Z5189 Encounter for other specified aftercare: Secondary | ICD-10-CM | POA: Diagnosis not present

## 2012-03-27 DIAGNOSIS — D508 Other iron deficiency anemias: Secondary | ICD-10-CM | POA: Diagnosis not present

## 2012-03-27 DIAGNOSIS — M726 Necrotizing fasciitis: Secondary | ICD-10-CM

## 2012-03-27 DIAGNOSIS — K922 Gastrointestinal hemorrhage, unspecified: Secondary | ICD-10-CM

## 2012-03-27 LAB — CBC
HCT: 33.8 % — ABNORMAL LOW (ref 36.0–46.0)
Hemoglobin: 11.4 g/dL — ABNORMAL LOW (ref 12.0–15.0)
RDW: 14.7 % (ref 11.5–15.5)
WBC: 16.5 10*3/uL — ABNORMAL HIGH (ref 4.0–10.5)

## 2012-03-27 LAB — URINALYSIS, ROUTINE W REFLEX MICROSCOPIC
Bilirubin Urine: NEGATIVE
Glucose, UA: NEGATIVE mg/dL
Hgb urine dipstick: NEGATIVE
Specific Gravity, Urine: 1.014 (ref 1.005–1.030)
Urobilinogen, UA: 0.2 mg/dL (ref 0.0–1.0)

## 2012-03-27 LAB — COMPREHENSIVE METABOLIC PANEL
ALT: 20 U/L (ref 0–35)
Albumin: 2.6 g/dL — ABNORMAL LOW (ref 3.5–5.2)
Alkaline Phosphatase: 58 U/L (ref 39–117)
BUN: 80 mg/dL — ABNORMAL HIGH (ref 6–23)
Chloride: 90 mEq/L — ABNORMAL LOW (ref 96–112)
Glucose, Bld: 108 mg/dL — ABNORMAL HIGH (ref 70–99)
Potassium: 6.4 mEq/L (ref 3.5–5.1)
Total Bilirubin: 0.3 mg/dL (ref 0.3–1.2)

## 2012-03-27 LAB — URINE MICROSCOPIC-ADD ON

## 2012-03-27 LAB — BASIC METABOLIC PANEL
BUN: 72 mg/dL — ABNORMAL HIGH (ref 6–23)
CO2: 18 mEq/L — ABNORMAL LOW (ref 19–32)
Calcium: 7.2 mg/dL — ABNORMAL LOW (ref 8.4–10.5)
Glucose, Bld: 108 mg/dL — ABNORMAL HIGH (ref 70–99)
Potassium: 5.6 mEq/L — ABNORMAL HIGH (ref 3.5–5.1)
Sodium: 127 mEq/L — ABNORMAL LOW (ref 135–145)

## 2012-03-27 LAB — LIPASE, BLOOD: Lipase: 107 U/L — ABNORMAL HIGH (ref 11–59)

## 2012-03-27 MED ORDER — SODIUM CHLORIDE 0.9 % IV BOLUS (SEPSIS)
1000.0000 mL | Freq: Once | INTRAVENOUS | Status: AC
  Administered 2012-03-27: 1000 mL via INTRAVENOUS

## 2012-03-27 MED ORDER — SODIUM POLYSTYRENE SULFONATE 15 GM/60ML PO SUSP
30.0000 g | Freq: Once | ORAL | Status: DC
  Filled 2012-03-27: qty 120

## 2012-03-27 MED ORDER — FENTANYL CITRATE 0.05 MG/ML IJ SOLN
50.0000 ug | Freq: Once | INTRAMUSCULAR | Status: AC
  Administered 2012-03-27: 50 ug via INTRAVENOUS
  Filled 2012-03-27: qty 2

## 2012-03-27 MED ORDER — VANCOMYCIN HCL IN DEXTROSE 1-5 GM/200ML-% IV SOLN
1000.0000 mg | Freq: Once | INTRAVENOUS | Status: AC
  Administered 2012-03-27: 1000 mg via INTRAVENOUS
  Filled 2012-03-27: qty 200

## 2012-03-27 MED ORDER — METRONIDAZOLE IN NACL 5-0.79 MG/ML-% IV SOLN
500.0000 mg | Freq: Three times a day (TID) | INTRAVENOUS | Status: DC
  Administered 2012-03-27 – 2012-03-29 (×5): 500 mg via INTRAVENOUS
  Filled 2012-03-27 (×7): qty 100

## 2012-03-27 MED ORDER — CHLORHEXIDINE GLUCONATE 0.12 % MT SOLN
15.0000 mL | Freq: Two times a day (BID) | OROMUCOSAL | Status: DC
  Administered 2012-03-28 (×2): 15 mL via OROMUCOSAL
  Filled 2012-03-27 (×3): qty 15

## 2012-03-27 MED ORDER — PANTOPRAZOLE SODIUM 40 MG IV SOLR
40.0000 mg | INTRAVENOUS | Status: DC
  Administered 2012-03-27 – 2012-03-28 (×2): 40 mg via INTRAVENOUS
  Filled 2012-03-27 (×3): qty 40

## 2012-03-27 MED ORDER — BIOTENE DRY MOUTH MT LIQD
15.0000 mL | Freq: Two times a day (BID) | OROMUCOSAL | Status: DC
  Administered 2012-03-28 – 2012-04-01 (×10): 15 mL via OROMUCOSAL

## 2012-03-27 MED ORDER — DEXTROSE 50 % IV SOLN: 50.0000 mL | Freq: Once | INTRAVENOUS | Status: DC

## 2012-03-27 MED ORDER — HEPARIN SODIUM (PORCINE) 5000 UNIT/ML IJ SOLN
5000.0000 [IU] | Freq: Three times a day (TID) | INTRAMUSCULAR | Status: DC
  Administered 2012-03-27 – 2012-03-31 (×12): 5000 [IU] via SUBCUTANEOUS
  Filled 2012-03-27 (×18): qty 1

## 2012-03-27 MED ORDER — SODIUM CHLORIDE 0.9 % IV SOLN
INTRAVENOUS | Status: AC
  Administered 2012-03-27 (×2): via INTRAVENOUS

## 2012-03-27 MED ORDER — LORAZEPAM 2 MG/ML IJ SOLN
INTRAMUSCULAR | Status: AC
  Administered 2012-03-27: 1 mg
  Filled 2012-03-27: qty 1

## 2012-03-27 MED ORDER — DEXTROSE 50 % IV SOLN
1.0000 | Freq: Once | INTRAVENOUS | Status: AC
  Administered 2012-03-27: 50 mL via INTRAVENOUS
  Filled 2012-03-27: qty 50

## 2012-03-27 MED ORDER — SODIUM CHLORIDE 0.9 % IV SOLN
2.0000 g | Freq: Once | INTRAVENOUS | Status: AC
  Administered 2012-03-27: 2 g via INTRAVENOUS
  Filled 2012-03-27: qty 20

## 2012-03-27 MED ORDER — SODIUM CHLORIDE 0.9 % IV SOLN
Freq: Once | INTRAVENOUS | Status: AC
  Administered 2012-03-27: 18:00:00 via INTRAVENOUS

## 2012-03-27 MED ORDER — ALBUTEROL (5 MG/ML) CONTINUOUS INHALATION SOLN
10.0000 mg | INHALATION_SOLUTION | RESPIRATORY_TRACT | Status: DC
  Administered 2012-03-27: 10 mg via RESPIRATORY_TRACT

## 2012-03-27 MED ORDER — INSULIN REGULAR HUMAN 100 UNIT/ML IJ SOLN: 10.0000 [IU] | Freq: Once | INTRAMUSCULAR | Status: DC

## 2012-03-27 MED ORDER — SODIUM CHLORIDE 0.9 % IV BOLUS (SEPSIS): 500.0000 mL | Freq: Once | INTRAVENOUS | Status: DC

## 2012-03-27 MED ORDER — DEXTROSE 5 % IV SOLN
1.0000 g | Freq: Three times a day (TID) | INTRAVENOUS | Status: DC
  Administered 2012-03-27 – 2012-04-01 (×12): 1 g via INTRAVENOUS
  Filled 2012-03-27 (×17): qty 1

## 2012-03-27 MED ORDER — SODIUM CHLORIDE 0.9 % IV SOLN
INTRAVENOUS | Status: DC
  Administered 2012-03-27: 21:00:00 via INTRAVENOUS

## 2012-03-27 MED ORDER — INSULIN ASPART 100 UNIT/ML ~~LOC~~ SOLN
10.0000 [IU] | Freq: Once | SUBCUTANEOUS | Status: AC
  Administered 2012-03-27: 10 [IU] via INTRAVENOUS
  Filled 2012-03-27: qty 1

## 2012-03-27 MED ORDER — METRONIDAZOLE IN NACL 5-0.79 MG/ML-% IV SOLN: 500.0000 mg | Freq: Once | INTRAVENOUS | Status: DC

## 2012-03-27 MED ORDER — AZTREONAM 1 G IJ SOLR: 1.0000 g | Freq: Three times a day (TID) | INTRAMUSCULAR | Status: DC

## 2012-03-27 MED ORDER — HYDROCORTISONE SOD SUCCINATE 100 MG IJ SOLR
50.0000 mg | Freq: Four times a day (QID) | INTRAMUSCULAR | Status: DC
  Administered 2012-03-27 – 2012-03-30 (×11): 50 mg via INTRAVENOUS
  Filled 2012-03-27 (×3): qty 1
  Filled 2012-03-27: qty 2
  Filled 2012-03-27 (×2): qty 1
  Filled 2012-03-27: qty 2
  Filled 2012-03-27 (×7): qty 1

## 2012-03-27 NOTE — H&P (Signed)
Name: [name] MRN: [id number] DOB: [birth year]    LOS: 0  Referring Provider:  EDP Reason for Referral:  Suspected sepsis  PULMONARY / CRITICAL CARE MEDICINE  The patient is encephalopathic and unable to provide history, which was obtained for available medical records.  HPI:  61 yo brought to [facility] ED with confusion, refusal to eat / drink (continued to take HCTZ / ACEI) and allow home care nurse to care for surgical wound.  Found to be hypotensive, in acute renal failure, hyperkalemic.  Past Medical History  Diagnosis Date  . Hypertension   . Colon polyps   . Stomach problems   . Hypokalemic alkalosis   . Allergic rhinitis   . GERD (gastroesophageal reflux disease)   . Stroke 2010    no residual problems  . Shortness of breath     due to anemia  . Anemia, iron deficiency 05/03/2011    recieves iron infusions  . Anemia   . GI bleeding   . Diverticulitis   . Transfusion history   . Difficulty sleeping     takes trazadone for sleep  . Peripheral vascular disease    Past Surgical History  Procedure Date  . Leg amputation above knee   . Esophagogastroduodenoscopy 05/05/2011    Procedure: ESOPHAGOGASTRODUODENOSCOPY (EGD);  Surgeon: Erick Blinks, MD;  Location: [facility];  Service: Gastroenterology;  Laterality: N/A;  Dr. Rhea Belton will do procedure for Dr. [name] Saturday.  . Esophagogastroduodenoscopy 05/08/2011    Procedure: ESOPHAGOGASTRODUODENOSCOPY (EGD);  Surgeon: Theda Belfast;  Location: [facility];  Service: Endoscopy;  Laterality: N/A;  . Esophagogastroduodenoscopy 06/07/2011    Procedure: ESOPHAGOGASTRODUODENOSCOPY (EGD);  Surgeon: Theda Belfast, MD;  Location: [facility];  Service: Endoscopy;  Laterality: N/A;  . Hot hemostasis 06/07/2011    Procedure: HOT HEMOSTASIS (ARGON PLASMA COAGULATION/BICAP);  Surgeon: Theda Belfast, MD;  Location: [facility];  Service: Endoscopy;  Laterality: N/A;  . Esophagogastroduodenoscopy 12/20/2011    Procedure:  ESOPHAGOGASTRODUODENOSCOPY (EGD);  Surgeon: Theda Belfast, MD;  Location: [facility];  Service: Endoscopy;  Laterality: N/A;  . Flexible sigmoidoscopy 12/21/2011    Procedure: FLEXIBLE SIGMOIDOSCOPY;  Surgeon: Theda Belfast, MD;  Location: [facility];  Service: Endoscopy;  Laterality: N/A;  . Carpal tunnel release     rt hand  . Partial colectomy 02/15/2012    Procedure: PARTIAL COLECTOMY;  Surgeon: Shelly Rubenstein, MD;  Location: [facility];  Service: General;  Laterality: N/A;  . Laparotomy 02/16/2012    Procedure: EXPLORATORY LAPAROTOMY;  Surgeon: Mariella Saa, MD;  Location: [facility];  Service: General;  Laterality: N/A;  oversewing of anastomotic leak and rigid sigmoidoscopy   Prior to Admission medications   Medication Sig Start Date End Date Taking? Authorizing Provider  atenolol (TENORMIN) 50 MG tablet Take 50 mg by mouth every morning.    Yes Historical Provider, MD  cetirizine (ZYRTEC) 10 MG tablet Take 10 mg by mouth daily.    Yes Historical Provider, MD  esomeprazole (NEXIUM) 40 MG capsule Take 40 mg by mouth daily before breakfast.   Yes Historical Provider, MD  FLUoxetine (PROZAC) 20 MG capsule Take 20 mg by mouth every morning.    Yes Historical Provider, MD  gabapentin (NEURONTIN) 300 MG capsule Take 600 mg by mouth 3 (three) times daily.    Yes Historical Provider, MD  hydrochlorothiazide (HYDRODIURIL) 12.5 MG tablet Take 12.5 mg by mouth every morning.    Yes Historical Provider, MD  lisinopril (PRINIVIL,ZESTRIL) 40 MG tablet Take 40  mg by mouth every morning.    Yes Historical Provider, MD  potassium chloride SA (K-DUR,KLOR-CON) 20 MEQ tablet Take 20 mEq by mouth daily.   Yes Historical Provider, MD  traMADol (ULTRAM) 50 MG tablet Take 100 mg by mouth every 12 (twelve) hours as needed. For pain.   Yes Historical Provider, MD  traZODone (DESYREL) 100 MG tablet Take 100 mg by mouth at bedtime.    Yes Historical Provider, MD  aspirin EC 81 MG tablet Take 162 mg by mouth 2  (two) times daily.    Historical Provider, MD  vitamin C (ASCORBIC ACID) 500 MG tablet Take 500 mg by mouth daily.    Historical Provider, MD   Allergies Allergies  Allergen Reactions  . Ciprofloxacin Hives and Swelling  . Morphine And Related Other (See Comments)    Makes pt sad and paranoid  . Penicillins Hives and Swelling  . Codeine Hives and Swelling   Family History History reviewed. No pertinent family history.  Social History  reports that she has been smoking Cigarettes.  She has never used smokeless tobacco. She reports that she drinks alcohol. She reports that she uses illicit drugs (Marijuana).  Review Of Systems:  Unable to provide  Brief patient description:  61 yo brought to [facility] ED with confusion, refusal to eat / drink (continued to take HCTZ / ACEI) and allow home care nurse to care for surgical wound.  Found to be hypotensive, in acute renal failure, hyperkalemic.  Events Since Admission: 10/31  Brought to [facility] ED with confusion, refusal to eat / drink (continued to take HCTZ / ACEI) and allow home care nurse to care for surgical wound.  Found to be hypotensive, in acute renal failure, hyperkalemic.  Current Status:  Vital Signs: Temp:  [98.3 F (36.8 C)-98.5 F (36.9 C)] 98.5 F (36.9 C) (10/31 1635) Pulse Rate:  [83-95] 95  (10/31 2000) Resp:  [9-19] 10  (10/31 2000) BP: (68-144)/(36-110) 79/46 mmHg (10/31 2000) SpO2:  [95 %-100 %] 100 % (10/31 2000)  Physical Examination: General:  Appears chronically ill Neuro:  Somnolent but arouses to stimulation, cogh / gag present HEENT:  PERRL Neck:  No JVD Cardiovascular:  RRR, no m/r/g Lungs:  Bilateral diminished air entry, no w/r/r Abdomen:  Soft, nontender, nondistended, bowel sounds present, wound-vac in place with nonpurulent drainage Musculoskeletal:  L AKA Skin:  No rash  Active Problems:  Renal failure, acute  Hyponatremia  Acute hyperkalemia  Dehydration  Acute encephalopathy  ASSESSMENT AND  PLAN  PULMONARY No results found for this basename: PHART:5,PCO2:5,PCO2ART:5,PO2ART:5,HCO3:5,O2SAT:5 in the last 168 hours  Ventilator Settings:   CXR:  10/31 >>> nad ETT:  NA  A:  No active issues. P:   Goal SpO2 > 92 Supplemental oxygen PRN  CARDIOVASCULAR  Lab 03/27/12 1620  TROPONINI --  LATICACIDVEN 1.1  PROBNP --   ECG:  10/31 >>> 1st degree block, nonspecific ST-T changes Lines: NA  A: Hypotension in setting of severe dehydration, antihypertensives.  Less likely SIRS. P:  Hold Atenolol, HCTZ, Lisinopril Goal MAP > 60 Check troponin No indications for CVL / vasopressors  RENAL  Lab 03/27/12 1750 03/27/12 1545  NA -- 123*  K 6.3* 6.4*  CL -- 90*  CO2 -- 20  BUN -- 80*  CREATININE -- 4.68*  CALCIUM -- 8.2*  MG -- --  PHOS -- --   Intake/Output    None    Foley:  10/31 >>>   A:  Acute renal failure /  prerenal azotemia in the setting of severe dehydration.  Hyperkalemia.  Hyponatremia. P:   Treated with Ca, Insulin, D50, Kayexalate Received 3 L of NS in ED, will give additional 1L bolus x 2 INF NS 150 mL/h Trend BMP  GASTROINTESTINAL  Lab 03/27/12 1545  AST 24  ALT 20  ALKPHOS 58  BILITOT 0.3  PROT 7.8  ALBUMIN 2.6*   A:  S/p exploratory lab.  Benign-appearing abdomen.  No evidence of wound infection. P:   NPO Surgery consulted, evaluated in ED, no concerns at this time  HEMATOLOGIC  Lab 03/27/12 1545  HGB 11.4*  HCT 33.8*  PLT 372  INR --  APTT --   A:  Anemia of chronic disease. P:  Trend CBC  INFECTIOUS  Lab 03/27/12 1545  WBC 16.5*  PROCALCITON --   Cultures: 10/31  Blood >>> 10/31  Urine >>>  Antibiotics: 10/31  Flagyl >>> 10/31  Aztreonam >>>  A:  No clear evidence of infection.  PCN allergy. P:   Antibiotics / cultures as above PCT  ENDOCRINE No results found for this basename: GLUCAP:5 in the last 168 hours  A:  Unknown adrenal function. P:   Cortisol level  NEUROLOGIC  A:  Acute encephalopathy,  metabolic. P:   Hold Neurontin / Trazodone  BEST PRACTICE / DISPOSITION Level of Care:  ICU Primary Service:  PCCM Consultants:  CCS Code Status:  Full Diet:  NPO DVT Px:  Heparin GI Px:  Protonix Skin Integrity:  Intact Social / Family:  Family is not available  The patient is critically ill with multiple organ systems failure and requires high complexity decision making for assessment and support, frequent evaluation and titration of therapies, application of advanced monitoring technologies and extensive interpretation of multiple databases. Critical Care Time devoted to patient care services described in this note is 40 minutes.  Lonia Farber, M.D. Pulmonary and Critical Care Medicine [facility] Pager: [phone]  03/27/2012, 8:43 PM

## 2012-03-27 NOTE — Progress Notes (Signed)
Name: [name] MRN: [id number] DOB: July 21, 1950  ELECTRONIC ICU PHYSICIAN NOTE  Problem:  Persistent shock p 5 liters IV NS in ER now arrived in ICU   Intake/Output Summary (Last 24 hours) at 03/27/12 2256 Last data filed at 03/27/12 2200  Gross per 24 hour  Intake    150 ml  Output    250 ml  Net   -100 ml      Intervention:  NS x one more liter/ HC stress doses ordered.  Sandrea Hughs 03/27/2012, 10:55 PM

## 2012-03-27 NOTE — ED Notes (Signed)
Per EMS-pt from home recently had abd surgery, part of intestines removed, family c/ that pt is unable to recognize family members, says bizarre things randomly. Pt refuses to eat and receive medical attention. Also refusing to allow wound nurse to care for surgery area. Pt IVC.

## 2012-03-27 NOTE — Consult Note (Signed)
Reason for Consult:Post op abdominal check Referring Physician: Dr. Lorenso Courier, EDP  [name] is an 61 y.o. female.  HPI: She underwent an elective left colectomy 02/15/2012. This was complicated by anastomotic staple line bleeding. She's taken to the operating room on 02/16/2012 and the staple line was oversewn. This controlled the bleeding. Her wound has been healing in by secondary intension and she has a VAC on.  She presented to the emergency department with altered mental status and was found to be in renal failure. I was asked to see her and check out her abdomen. She is unable to communicate right now.  Past Medical History  Diagnosis Date  . Hypertension   . Colon polyps   . Stomach problems   . Hypokalemic alkalosis   . Allergic rhinitis   . GERD (gastroesophageal reflux disease)   . Stroke 2010    no residual problems  . Shortness of breath     due to anemia  . Anemia, iron deficiency 05/03/2011    recieves iron infusions  . Anemia   . GI bleeding   . Diverticulitis   . Transfusion history   . Difficulty sleeping     takes trazadone for sleep  . Peripheral vascular disease     Past Surgical History  Procedure Date  . Leg amputation above knee   . Esophagogastroduodenoscopy 05/05/2011    Procedure: ESOPHAGOGASTRODUODENOSCOPY (EGD);  Surgeon: Erick Blinks, MD;  Location: [facility];  Service: Gastroenterology;  Laterality: N/A;  Dr. Rhea Belton will do procedure for Dr. [name] Saturday.  . Esophagogastroduodenoscopy 05/08/2011    Procedure: ESOPHAGOGASTRODUODENOSCOPY (EGD);  Surgeon: Theda Belfast;  Location: [facility];  Service: Endoscopy;  Laterality: N/A;  . Esophagogastroduodenoscopy 06/07/2011    Procedure: ESOPHAGOGASTRODUODENOSCOPY (EGD);  Surgeon: Theda Belfast, MD;  Location: [facility];  Service: Endoscopy;  Laterality: N/A;  . Hot hemostasis 06/07/2011    Procedure: HOT HEMOSTASIS (ARGON PLASMA COAGULATION/BICAP);  Surgeon: Theda Belfast, MD;  Location: [facility];  Service: Endoscopy;  Laterality: N/A;  . Esophagogastroduodenoscopy 12/20/2011    Procedure: ESOPHAGOGASTRODUODENOSCOPY (EGD);  Surgeon: Theda Belfast, MD;  Location: [facility];  Service: Endoscopy;  Laterality: N/A;  . Flexible sigmoidoscopy 12/21/2011    Procedure: FLEXIBLE SIGMOIDOSCOPY;  Surgeon: Theda Belfast, MD;  Location: [facility];  Service: Endoscopy;  Laterality: N/A;  . Carpal tunnel release     rt hand  . Partial colectomy 02/15/2012    Procedure: PARTIAL COLECTOMY;  Surgeon: Shelly Rubenstein, MD;  Location: [facility];  Service: General;  Laterality: N/A;  . Laparotomy 02/16/2012    Procedure: EXPLORATORY LAPAROTOMY;  Surgeon: Mariella Saa, MD;  Location: [facility];  Service: General;  Laterality: N/A;  oversewing of anastomotic leak and rigid sigmoidoscopy    History reviewed. No pertinent family history.  Social History:  reports that she has been smoking Cigarettes.  She has never used smokeless tobacco. She reports that she drinks alcohol. She reports that she uses illicit drugs (Marijuana).  Allergies:  Allergies  Allergen Reactions  . Ciprofloxacin Hives and Swelling  . Morphine And Related Other (See Comments)    Makes pt sad and paranoid  . Penicillins Hives and Swelling  . Codeine Hives and Swelling    Prior to Admission medications   Medication Sig Start Date End Date Taking? Authorizing Provider  atenolol (TENORMIN) 50 MG tablet Take 50 mg by mouth every morning.    Yes Historical Provider, MD  cetirizine (ZYRTEC) 10 MG tablet Take  10 mg by mouth daily.    Yes Historical Provider, MD  esomeprazole (NEXIUM) 40 MG capsule Take 40 mg by mouth daily before breakfast.   Yes Historical Provider, MD  FLUoxetine (PROZAC) 20 MG capsule Take 20 mg by mouth every morning.    Yes Historical Provider, MD  gabapentin (NEURONTIN) 300 MG capsule Take 600 mg by mouth 3 (three) times daily.    Yes Historical Provider, MD  hydrochlorothiazide (HYDRODIURIL)  12.5 MG tablet Take 12.5 mg by mouth every morning.    Yes Historical Provider, MD  lisinopril (PRINIVIL,ZESTRIL) 40 MG tablet Take 40 mg by mouth every morning.    Yes Historical Provider, MD  potassium chloride SA (K-DUR,KLOR-CON) 20 MEQ tablet Take 20 mEq by mouth daily.   Yes Historical Provider, MD  traMADol (ULTRAM) 50 MG tablet Take 100 mg by mouth every 12 (twelve) hours as needed. For pain.   Yes Historical Provider, MD  traZODone (DESYREL) 100 MG tablet Take 100 mg by mouth at bedtime.    Yes Historical Provider, MD  aspirin EC 81 MG tablet Take 162 mg by mouth 2 (two) times daily.    Historical Provider, MD  vitamin C (ASCORBIC ACID) 500 MG tablet Take 500 mg by mouth daily.    Historical Provider, MD     Results for orders placed during the hospital encounter of 03/27/12 (from the past 48 hour(s))  CBC     Status: Abnormal   Collection Time   03/27/12  3:45 PM      Component Value Range Comment   WBC 16.5 (*) 4.0 - 10.5 K/uL    RBC 4.02  3.87 - 5.11 MIL/uL    Hemoglobin 11.4 (*) 12.0 - 15.0 g/dL    HCT 29.5 (*) 28.4 - 46.0 %    MCV 84.1  78.0 - 100.0 fL    MCH 28.4  26.0 - 34.0 pg    MCHC 33.7  30.0 - 36.0 g/dL    RDW 13.2  44.0 - 10.2 %    Platelets 372  150 - 400 K/uL   COMPREHENSIVE METABOLIC PANEL     Status: Abnormal   Collection Time   03/27/12  3:45 PM      Component Value Range Comment   Sodium 123 (*) 135 - 145 mEq/L    Potassium 6.4 (*) 3.5 - 5.1 mEq/L    Chloride 90 (*) 96 - 112 mEq/L    CO2 20  19 - 32 mEq/L    Glucose, Bld 108 (*) 70 - 99 mg/dL    BUN 80 (*) 6 - 23 mg/dL    Creatinine, Ser 7.25 (*) 0.50 - 1.10 mg/dL    Calcium 8.2 (*) 8.4 - 10.5 mg/dL    Total Protein 7.8  6.0 - 8.3 g/dL    Albumin 2.6 (*) 3.5 - 5.2 g/dL    AST 24  0 - 37 U/L    ALT 20  0 - 35 U/L    Alkaline Phosphatase 58  39 - 117 U/L    Total Bilirubin 0.3  0.3 - 1.2 mg/dL    GFR calc non Af Amer 9 (*) >90 mL/min    GFR calc Af Amer 11 (*) >90 mL/min   LIPASE, BLOOD     Status:  Abnormal   Collection Time   03/27/12  3:45 PM      Component Value Range Comment   Lipase 107 (*) 11 - 59 U/L   LACTIC ACID, PLASMA  Status: Normal   Collection Time   03/27/12  4:20 PM      Component Value Range Comment   Lactic Acid, Venous 1.1  0.5 - 2.2 mmol/L   POTASSIUM     Status: Abnormal   Collection Time   03/27/12  5:50 PM      Component Value Range Comment   Potassium 6.3 (*) 3.5 - 5.1 mEq/L   URINALYSIS, ROUTINE W REFLEX MICROSCOPIC     Status: Abnormal   Collection Time   03/27/12  5:58 PM      Component Value Range Comment   Color, Urine YELLOW  YELLOW    APPearance CLOUDY (*) CLEAR    Specific Gravity, Urine 1.014  1.005 - 1.030    pH 5.0  5.0 - 8.0    Glucose, UA NEGATIVE  NEGATIVE mg/dL    Hgb urine dipstick NEGATIVE  NEGATIVE    Bilirubin Urine NEGATIVE  NEGATIVE    Ketones, ur NEGATIVE  NEGATIVE mg/dL    Protein, ur NEGATIVE  NEGATIVE mg/dL    Urobilinogen, UA 0.2  0.0 - 1.0 mg/dL    Nitrite NEGATIVE  NEGATIVE    Leukocytes, UA MODERATE (*) NEGATIVE   URINE MICROSCOPIC-ADD ON     Status: Abnormal   Collection Time   03/27/12  5:58 PM      Component Value Range Comment   Squamous Epithelial / LPF FEW (*) RARE    WBC, UA 3-6  <3 WBC/hpf    Bacteria, UA MANY (*) RARE    Urine-Other MUCOUS PRESENT       Dg Chest 2 View  03/27/2012  *RADIOLOGY REPORT*  Clinical Data: Altered mental status  CHEST - 2 VIEW  Comparison: None.  Findings: Normal mediastinum and cardiac silhouette.  Normal pulmonary  vasculature.  No evidence of effusion, infiltrate, or pneumothorax.  No acute bony abnormality.  Aorta is ectatic.  IMPRESSION: No acute cardiopulmonary process.   Original Report Authenticated By: Genevive Bi, M.D.    Ct Head Wo Contrast  03/27/2012  *RADIOLOGY REPORT*  Clinical Data: Altered mental status.  CT HEAD WITHOUT CONTRAST  Technique:  Contiguous axial images were obtained from the base of the skull through the vertex without contrast.   Comparison: Head CT 03/06/2011.  Findings: Mild cerebral and cerebellar atrophy.  Mild patchy areas of decreased attenuation throughout the deep and periventricular white matter of the cerebral hemispheres bilaterally is unchanged compared to the prior examination and most compatible with mild chronic microvascular ischemic disease.  No definite acute intracranial abnormalities.  Specifically, no definite signs of large vascular territory acute/subacute cerebral ischemia, no acute intracerebral hemorrhage, no focal mass, mass effect, hydrocephalus or abnormal intra or extra-axial fluid collections.  Visualized paranasal sinuses and mastoids are well pneumatized.  No acute displaced skull fractures are identified.  IMPRESSION: 1.  No acute intracranial abnormalities. 2.  Mild cerebral and cerebellar atrophy with mild chronic microvascular ischemic changes.   Original Report Authenticated By: Trudie Reed, M.D.     Review of Systems  Unable to perform ROS  Blood pressure 79/46, pulse 95, temperature 98.5 F (36.9 C), temperature source Rectal, resp. rate 10, SpO2 100.00%. Physical Exam  Constitutional: She appears well-developed and well-nourished.       Minimally responsive.  GI: Soft.       VAC on draining clear fluid.  No discernible tenderness.    Assessment/Plan: Post left colectomy with take back for staple line bleeding about 6 weeks ago.  Abdomen/VAC look okay.  Plan:  Will order MWF VAC changes.  Saahir Prude J 03/27/2012, 8:31 PM

## 2012-03-27 NOTE — ED Notes (Signed)
Attempted to call report to ICU.  Pt unavailable.

## 2012-03-27 NOTE — ED Notes (Signed)
Dr. Al Corpus notified that patients BP low.  Third liter of fluid ordered after starting second IV site.

## 2012-03-27 NOTE — Progress Notes (Signed)
ANTIBIOTIC CONSULT NOTE - INITIAL  Pharmacy Consult for:  Aztreonam Indication:  Suspected sepsis  Allergies  Allergen Reactions  . Ciprofloxacin Hives and Swelling  . Morphine And Related Other (See Comments)    Makes pt sad and paranoid  . Penicillins Hives and Swelling  . Codeine Hives and Swelling    Patient Measurements: Ht. 63 inches    Wt. 70.8 kg   Vital Signs: Temp: 98.5 F (36.9 C) (10/31 1635) Temp src: Rectal (10/31 1635) BP: 79/46 mmHg (10/31 2000) Pulse Rate: 95  (10/31 2000)  Labs:  [facility] 03/27/12 1545  WBC 16.5*  HGB 11.4*  PLT 372  LABCREA --  CREATININE 4.68*   Microbiology: Recent Results (from the past 720 hour(s))  CULTURE, BLOOD (ROUTINE X 2)     Status: Normal   Collection Time   03/05/12 11:30 AM      Component Value Range Status Comment   Specimen Description BLOOD LEFT HAND   Final    Special Requests BOTTLES DRAWN AEROBIC AND ANAEROBIC   Final    Culture  Setup Time 03/05/2012 15:21   Final    Culture NO GROWTH 5 DAYS   Final    Report Status 03/11/2012 FINAL   Final   CULTURE, BLOOD (ROUTINE X 2)     Status: Normal   Collection Time   03/05/12 11:35 AM      Component Value Range Status Comment   Specimen Description BLOOD RIGHT ANTECUBITAL   Final    Special Requests BOTTLES DRAWN AEROBIC ONLY   Final    Culture  Setup Time 03/05/2012 15:21   Final    Culture NO GROWTH 5 DAYS   Final    Report Status 03/11/2012 FINAL   Final   URINE CULTURE     Status: Normal   Collection Time   03/05/12  4:47 PM      Component Value Range Status Comment   Specimen Description URINE, CATHETERIZED   Final    Special Requests Normal   Final    Culture  Setup Time 03/06/2012 02:18   Final    Colony Count NO GROWTH   Final    Culture NO GROWTH   Final    Report Status 03/07/2012 FINAL   Final     Medical History: Past Medical History  Diagnosis Date  . Hypertension   . Colon polyps   . Stomach problems   . Hypokalemic alkalosis     . Allergic rhinitis   . GERD (gastroesophageal reflux disease)   . Stroke 2010    no residual problems  . Shortness of breath     due to anemia  . Anemia, iron deficiency 05/03/2011    recieves iron infusions  . Anemia   . GI bleeding   . Diverticulitis   . Transfusion history   . Difficulty sleeping     takes trazadone for sleep  . Peripheral vascular disease     Medications:  Scheduled:    . sodium chloride   Intravenous Once  . sodium chloride   Intravenous STAT  . aztreonam  1 g Intravenous Q8H  . calcium gluconate  2 g Intravenous Once  . dextrose  1 ampule Intravenous Once  . fentaNYL  50 mcg Intravenous Once  . heparin subcutaneous  5,000 Units Subcutaneous Q8H  . insulin aspart  10 Units Intravenous Once  . LORazepam      . metronidazole  500 mg Intravenous Q8H  . pantoprazole (PROTONIX)  IV  40 mg Intravenous Q24H  . sodium chloride  1,000 mL Intravenous Once  . sodium chloride  1,000 mL Intravenous Once  . sodium chloride  1,000 mL Intravenous Once  . sodium chloride  500 mL Intravenous Once  . sodium polystyrene  30 g Oral Once  . vancomycin  1,000 mg Intravenous Once  . DISCONTD: aztreonam  1 g Intramuscular Q8H  . DISCONTD: dextrose  50 mL Intravenous Once  . DISCONTD: insulin regular  10 Units Intravenous Once  . DISCONTD: metronidazole  500 mg Intravenous Once   Assessment:  Asked to assist with IV Aztreonam for this 61 year-old female with altered mental status, hypotension, acute renal failure, hyperkalemia, and suspected sepsis.  Partial colectomy performed 02/15/12 due to lower GI bleeding.  Visited ED 03/05/12 with abdominal wound dehiscence.  Wound in [facility], has been receiving home health visits for wound care.  Goals of Therapy:   Antibiotic doses appropriate for renal function  Eradication of infection  Plan:  Aztreonam 1 gram IV every 8 hours.  Polo Riley R.Ph. 03/27/2012 10:25 PM

## 2012-03-27 NOTE — ED Notes (Signed)
[id number]<AV> Expected date:<BR> Expected time:<BR> Means of arrival:<BR> Comments:<BR> IVC- ALOC

## 2012-03-27 NOTE — ED Notes (Signed)
Dr. Lorenso Courier notified of patient's blood pressure.  Second liter changed to bolus.

## 2012-03-27 NOTE — ED Notes (Signed)
Patient transported to X-ray 

## 2012-03-27 NOTE — ED Notes (Signed)
Report called to ICU

## 2012-03-27 NOTE — ED Notes (Signed)
Pt unable to stand, could not complete orthostatic vital signs.

## 2012-03-27 NOTE — ED Provider Notes (Addendum)
History     CSN: [id number]  Arrival date & time 03/27/12  1447   First MD Initiated Contact with Patient 03/27/12 1516      Chief Complaint  Patient presents with  . Altered Mental Status  . Medical Clearance    (Consider location/radiation/quality/duration/timing/severity/associated sxs/prior treatment) HPI Comments: Ms. Hantz presents via EMS for evaluation of altered mental status. She recently had abdominal surgery and what sounds like a partial colectomy. There were some complications in the peri-operative period and she now has a wound VAC. Since returning home, her family relates that she has not been herself. They note prolonged periods of confusion. Over the last several days she has stopped eating and drinking. Now she has been unable to recognize family members and has withdrawn from normal activities. She had refused to seek medical attention prompting her family to file involuntary commitment papers. Her nieces providing most of the history today. She states that she has had no fevers and that a home care agency has been taking care of the wound. She is unaware if there's been increased drainage from the device over the last several days.  The history is provided by a relative. The history is limited by the condition of the patient (Patient is altered).    Past Medical History  Diagnosis Date  . Hypertension   . Colon polyps   . Stomach problems   . Hypokalemic alkalosis   . Allergic rhinitis   . GERD (gastroesophageal reflux disease)   . Stroke 2010    no residual problems  . Shortness of breath     due to anemia  . Anemia, iron deficiency 05/03/2011    recieves iron infusions  . Anemia   . GI bleeding   . Diverticulitis   . Transfusion history   . Difficulty sleeping     takes trazadone for sleep  . Peripheral vascular disease     Past Surgical History  Procedure Date  . Leg amputation above knee   . Esophagogastroduodenoscopy 05/05/2011    Procedure:  ESOPHAGOGASTRODUODENOSCOPY (EGD);  Surgeon: Erick Blinks, MD;  Location: [facility];  Service: Gastroenterology;  Laterality: N/A;  Dr. Rhea Belton will do procedure for Dr. [name] Saturday.  . Esophagogastroduodenoscopy 05/08/2011    Procedure: ESOPHAGOGASTRODUODENOSCOPY (EGD);  Surgeon: Theda Belfast;  Location: [facility];  Service: Endoscopy;  Laterality: N/A;  . Esophagogastroduodenoscopy 06/07/2011    Procedure: ESOPHAGOGASTRODUODENOSCOPY (EGD);  Surgeon: Theda Belfast, MD;  Location: [facility];  Service: Endoscopy;  Laterality: N/A;  . Hot hemostasis 06/07/2011    Procedure: HOT HEMOSTASIS (ARGON PLASMA COAGULATION/BICAP);  Surgeon: Theda Belfast, MD;  Location: [facility];  Service: Endoscopy;  Laterality: N/A;  . Esophagogastroduodenoscopy 12/20/2011    Procedure: ESOPHAGOGASTRODUODENOSCOPY (EGD);  Surgeon: Theda Belfast, MD;  Location: [facility];  Service: Endoscopy;  Laterality: N/A;  . Flexible sigmoidoscopy 12/21/2011    Procedure: FLEXIBLE SIGMOIDOSCOPY;  Surgeon: Theda Belfast, MD;  Location: [facility];  Service: Endoscopy;  Laterality: N/A;  . Carpal tunnel release     rt hand  . Partial colectomy 02/15/2012    Procedure: PARTIAL COLECTOMY;  Surgeon: Shelly Rubenstein, MD;  Location: [facility];  Service: General;  Laterality: N/A;  . Laparotomy 02/16/2012    Procedure: EXPLORATORY LAPAROTOMY;  Surgeon: Mariella Saa, MD;  Location: [facility];  Service: General;  Laterality: N/A;  oversewing of anastomotic leak and rigid sigmoidoscopy    No family history on file.  History  Substance Use Topics  .  Smoking status: Current Some Day Smoker    Types: Cigarettes  . Smokeless tobacco: Never Used  . Alcohol Use: Yes     occasionally    OB History    Grav Para Term Preterm Abortions TAB SAB Ect Mult Living                  Review of Systems  Unable to perform ROS: Mental status change    Allergies  Ciprofloxacin; Morphine and related; Penicillins; and  Codeine  Home Medications   Current Outpatient Rx  Name Route Sig Dispense Refill  . ASPIRIN EC 81 MG PO TBEC Oral Take 162 mg by mouth 2 (two) times daily.    . ATENOLOL 50 MG PO TABS Oral Take 50 mg by mouth every morning.     Marland Kitchen CETIRIZINE HCL 10 MG PO TABS Oral Take 10 mg by mouth daily.     Marland Kitchen ESOMEPRAZOLE MAGNESIUM 40 MG PO CPDR Oral Take 40 mg by mouth daily before breakfast.    . FLUOXETINE HCL 20 MG PO CAPS Oral Take 20 mg by mouth every morning.     Marland Kitchen GABAPENTIN 300 MG PO CAPS Oral Take 600 mg by mouth 3 (three) times daily.     Marland Kitchen HYDROCHLOROTHIAZIDE 12.5 MG PO TABS Oral Take 12.5 mg by mouth every morning.     Marland Kitchen LISINOPRIL 40 MG PO TABS Oral Take 40 mg by mouth every morning.     Marland Kitchen POTASSIUM CHLORIDE CRYS ER 20 MEQ PO TBCR Oral Take 20 mEq by mouth daily.    . TRAMADOL HCL 50 MG PO TABS Oral Take 100 mg by mouth every 12 (twelve) hours as needed.    . TRAZODONE HCL 100 MG PO TABS Oral Take 100 mg by mouth at bedtime.     Marland Kitchen VITAMIN C 500 MG PO TABS Oral Take 500 mg by mouth daily.      BP 144/110  Pulse 86  Temp 98.3 F (36.8 C) (Oral)  Resp 19  SpO2 95%  Physical Exam  Nursing note and vitals reviewed. Constitutional: She appears well-developed and well-nourished.  Non-toxic appearance. She has a sickly appearance. She appears ill. No distress. She is not intubated.  HENT:  Head: Normocephalic and atraumatic. Head is without raccoon's eyes, without Battle's sign, without right periorbital erythema and without left periorbital erythema. No trismus in the jaw.  Right Ear: External ear normal. No mastoid tenderness.  Left Ear: External ear normal. No mastoid tenderness.  Nose: Nose normal. No rhinorrhea. No epistaxis. Right sinus exhibits no maxillary sinus tenderness and no frontal sinus tenderness. Left sinus exhibits no maxillary sinus tenderness and no frontal sinus tenderness.  Mouth/Throat: Uvula is midline. Mucous membranes are not pale, dry and not cyanotic. No uvula  swelling. No oropharyngeal exudate, posterior oropharyngeal edema, posterior oropharyngeal erythema or tonsillar abscesses.  Eyes: EOM and lids are normal. Pupils are equal, round, and reactive to light. Right eye exhibits no discharge and no exudate. Left eye exhibits no discharge and no exudate. Right conjunctiva is not injected. Right conjunctiva has no hemorrhage. Left conjunctiva is not injected. Left conjunctiva has no hemorrhage. No scleral icterus. Right eye exhibits no nystagmus. Left eye exhibits no nystagmus. Right pupil is round and reactive. Left pupil is round and reactive. Pupils are equal.  Neck: Trachea normal, normal range of motion, full passive range of motion without pain and phonation normal. Neck supple. Normal carotid pulses and no JVD present. No tracheal tenderness, no spinous  process tenderness and no muscular tenderness present. Carotid bruit is not present. No rigidity. No tracheal deviation, no edema, no erythema and normal range of motion present. No mass present.  Cardiovascular: Normal rate, regular rhythm, intact distal pulses and normal pulses.   No extrasystoles are present. PMI is not displaced.  Exam reveals distant heart sounds. Exam reveals no gallop, no friction rub and no decreased pulses.   No murmur heard. Pulmonary/Chest: Effort normal. No accessory muscle usage or stridor. No apnea, not tachypneic and not bradypneic. She is not intubated. No respiratory distress. She has decreased breath sounds (Diffusely diminished). She has no wheezes. She has no rhonchi. She has no rales.  Abdominal: Soft. She exhibits no distension, no pulsatile liver, no fluid wave, no abdominal bruit, no ascites, no pulsatile midline mass and no mass. Bowel sounds are decreased. There is no hepatosplenomegaly. There is generalized tenderness. There is guarding (Diminished with patient distraction). There is no rigidity, no rebound, no CVA tenderness, no tenderness at McBurney's point and  negative Murphy's sign. A hernia (No large abdominal wall defect with wound VAC in place. There is some serous-appearing drainage in the wound VAC collection device. No erythema noted around the wound margins.) is present.  Musculoskeletal: Normal range of motion. She exhibits edema (Trace). She exhibits no tenderness.       Left above-the-knee amputation  Neurological: She is alert. She has normal strength. She displays no tremor. No cranial nerve deficit or sensory deficit. GCS eye subscore is 4. GCS verbal subscore is 4. GCS motor subscore is 6. She displays no Babinski's sign on the right side.  Skin: Skin is warm and dry. No bruising, no ecchymosis, no laceration and no rash noted. She is not diaphoretic. No cyanosis or erythema. No pallor. Nails show no clubbing.  Psychiatric: Her mood appears not anxious. Her affect is not angry, not blunt, not labile and not inappropriate. Her speech is delayed. Her speech is not rapid and/or pressured, not tangential and not slurred. She is slowed and withdrawn. She is not agitated, not aggressive, is not hyperactive and not combative. Cognition and memory are impaired. She exhibits a depressed mood. She is noncommunicative. She exhibits abnormal recent memory and abnormal remote memory. She is inattentive.    ED Course  Procedures (including critical care time)   Labs Reviewed  CBC  COMPREHENSIVE METABOLIC PANEL  LIPASE, BLOOD  LACTIC ACID, PLASMA  URINALYSIS, ROUTINE W REFLEX MICROSCOPIC  URINE CULTURE   No results found.   No diagnosis found.   Date: 03/27/2012  Rate: 86 bpm  Rhythm: sinus  QRS Axis: normal  Intervals: PR prolonged  ST/T Wave abnormalities: nonspecific ST changes  Conduction Disutrbances:first-degree A-V block   Narrative Interpretation:   Old EKG Reviewed: unchanged      MDM  Patient presents for evaluation of altered mental status. Her family was concerned because she refused to seek medical care. They filed  involuntary commitment papers and had her brought here to the emergency department for further evaluation. She is status post a recent abdominal surgery. She experienced some postoperative complications and now has a wound VAC in place. Currently she is altered, not oriented to person, place, or time. She appears clinically dehydrated. She also has some vague abdominal tenderness but no evidence of a peritonitis. Will obtain basic labs, urinalysis and culture, EKG, chest x-ray, CT scan of the head, orthostatic vital signs, and will also review notes from her recent hospitalization. Will also administer IV fluids. Anticipate  she will be admitted to the hospital on conclusion of this evaluation.  1830.  Pt still altered.  Noted a leukocytosis with hyperkalemia.  Pt remains afebrile.  Repeated the serum potassium as she has no EKG changes to suggest hyperkalemia.  It is again elevated.  Ordered kayexalate, insulin, D50, calcium gluconate, albuterol.  Pt appears clinically dehydrated.  Note hyponatremia with acute renal failure.  IVF are infusing.  Pt has no obvious source for infection and is afebrile.  She does have some hypotension.  Administering an IVF bolus.  Discussed with the on-call hospitalist.  Plan admit to a step-down bed.  At his request, blood cultures have been ordered.  She has penicillin and flouroquinolone allergies as well as a hx of VRE infection.  A consult has been placed with infectious disease for antibiotic recommendations.  Zyvox (linezolid) appears to be a good choice for empirical coverage however it is on the hospital's restricted list and requires authorization from ID prior to administration.  A consult has been placed with general surgery also for evaluation of the wound on the abdomen.  1920.  Pt has demonstrate persistent hypotension.  I contacted Dr. Waymon Amato (hospitalist) to alert him that I would be changing her status from a step-down to an ICU admission.  I discussed her  evaluation with Dr Sherene Sires (intensivist) and she has been accepted for admission to the ICU service.  I also discussed her evaluation with Dr. Purnell Shoemaker (gen surg).  He will perform a bedside evaluation of the abdominal wound and provide recommendations.  Lastly I discussed options for antibiotic coverage with Dr. Drue Second (infectious disease specialist).  Vancomycin, aztreonam, and metronidazole have been ordered.  A second IVF bolus has been ordered.  Will continue to closely monitor as she awaits admission orders and placement in the ICU.  CRITICAL CARE Performed by: Dana Allan T   Total critical care time: 60+  Critical care time was exclusive of separately billable procedures and treating other patients.  Critical care was necessary to treat or prevent imminent or life-threatening deterioration.  Critical care was time spent personally by me on the following activities: development of treatment plan with patient and/or surrogate as well as nursing, discussions with consultants, evaluation of patient's response to treatment, examination of patient, obtaining history from patient or surrogate, ordering and performing treatments and interventions, ordering and review of laboratory studies, ordering and review of radiographic studies, pulse oximetry and re-evaluation of patient's condition.       Tobin Chad, MD 03/27/12 1610  Tobin Chad, MD 03/27/12 [id number]

## 2012-03-28 DIAGNOSIS — K625 Hemorrhage of anus and rectum: Secondary | ICD-10-CM

## 2012-03-28 DIAGNOSIS — R4182 Altered mental status, unspecified: Secondary | ICD-10-CM

## 2012-03-28 DIAGNOSIS — D72829 Elevated white blood cell count, unspecified: Secondary | ICD-10-CM

## 2012-03-28 DIAGNOSIS — R578 Other shock: Secondary | ICD-10-CM

## 2012-03-28 LAB — BASIC METABOLIC PANEL
BUN: 50 mg/dL — ABNORMAL HIGH (ref 6–23)
CO2: 14 mEq/L — ABNORMAL LOW (ref 19–32)
Calcium: 7.2 mg/dL — ABNORMAL LOW (ref 8.4–10.5)
Chloride: 110 mEq/L (ref 96–112)
Creatinine, Ser: 2.1 mg/dL — ABNORMAL HIGH (ref 0.50–1.10)
GFR calc Af Amer: 29 mL/min — ABNORMAL LOW (ref >90)
GFR calc Af Amer: 43 mL/min — ABNORMAL LOW (ref >90)
GFR calc non Af Amer: 37 mL/min — ABNORMAL LOW (ref >90)
Glucose, Bld: 196 mg/dL — ABNORMAL HIGH (ref 70–99)
Sodium: 134 mEq/L — ABNORMAL LOW (ref 135–145)
Sodium: 135 mEq/L (ref 135–145)

## 2012-03-28 LAB — CBC
MCV: 85.7 fL (ref 78.0–100.0)
Platelets: 250 10*3/uL (ref 150–400)
RBC: 3.15 MIL/uL — ABNORMAL LOW (ref 3.87–5.11)
RDW: 15.1 % (ref 11.5–15.5)
WBC: 6.4 10*3/uL (ref 4.0–10.5)

## 2012-03-28 LAB — PHOSPHORUS: Phosphorus: 4.7 mg/dL — ABNORMAL HIGH (ref 2.3–4.6)

## 2012-03-28 LAB — MAGNESIUM: Magnesium: 0.7 mg/dL — CL (ref 1.5–2.5)

## 2012-03-28 LAB — PROCALCITONIN: Procalcitonin: <0.1 ng/mL

## 2012-03-28 LAB — VITAMIN B12: Vitamin B-12: 1029 pg/mL — ABNORMAL HIGH (ref 211–911)

## 2012-03-28 LAB — TSH: TSH: 0.079 u[IU]/mL — ABNORMAL LOW (ref 0.350–4.500)

## 2012-03-28 MED ORDER — INSULIN ASPART 100 UNIT/ML ~~LOC~~ SOLN
10.0000 [IU] | Freq: Once | SUBCUTANEOUS | Status: AC
  Administered 2012-03-28: 10 [IU] via SUBCUTANEOUS

## 2012-03-28 MED ORDER — MAGNESIUM SULFATE 40 MG/ML IJ SOLN
4.0000 g | Freq: Once | INTRAMUSCULAR | Status: AC
  Administered 2012-03-28: 4 g via INTRAVENOUS
  Filled 2012-03-28: qty 100

## 2012-03-28 MED ORDER — SODIUM CHLORIDE 0.9 % IV BOLUS (SEPSIS)
500.0000 mL | Freq: Once | INTRAVENOUS | Status: AC
  Administered 2012-03-28: 500 mL via INTRAVENOUS

## 2012-03-28 MED ORDER — SODIUM CHLORIDE 0.9 % IV BOLUS (SEPSIS)
1000.0000 mL | Freq: Once | INTRAVENOUS | Status: AC
  Administered 2012-03-28: 1000 mL via INTRAVENOUS

## 2012-03-28 MED ORDER — DEXTROSE 50 % IV SOLN
50.0000 mL | Freq: Once | INTRAVENOUS | Status: AC
  Administered 2012-03-28: 50 mL via INTRAVENOUS
  Filled 2012-03-28: qty 50

## 2012-03-28 MED ORDER — MAGNESIUM SULFATE 40 MG/ML IJ SOLN
2.0000 g | Freq: Once | INTRAMUSCULAR | Status: AC
  Administered 2012-03-28: 2 g via INTRAVENOUS
  Filled 2012-03-28: qty 50

## 2012-03-28 MED ORDER — SODIUM CHLORIDE 0.9 % IV SOLN
1.0000 g | Freq: Once | INTRAVENOUS | Status: AC
  Administered 2012-03-28: 1 g via INTRAVENOUS
  Filled 2012-03-28: qty 10

## 2012-03-28 MED ORDER — SODIUM CHLORIDE 0.45 % IV SOLN: INTRAVENOUS | Status: DC

## 2012-03-28 MED ORDER — THIAMINE HCL 100 MG/ML IJ SOLN
100.0000 mg | Freq: Every day | INTRAMUSCULAR | Status: DC
  Administered 2012-03-28: 100 mg via INTRAVENOUS
  Filled 2012-03-28 (×2): qty 1

## 2012-03-28 MED ORDER — ENSURE COMPLETE PO LIQD
237.0000 mL | Freq: Every day | ORAL | Status: DC
  Administered 2012-03-28 – 2012-04-01 (×5): 237 mL via ORAL

## 2012-03-28 MED ORDER — FOLIC ACID 5 MG/ML IJ SOLN
1.0000 mg | Freq: Every day | INTRAMUSCULAR | Status: DC
  Administered 2012-03-28: 1 mg via INTRAVENOUS
  Filled 2012-03-28 (×2): qty 0.2

## 2012-03-28 MED ORDER — SODIUM POLYSTYRENE SULFONATE 15 GM/60ML PO SUSP
30.0000 g | Freq: Once | ORAL | Status: AC
  Administered 2012-03-28: 30 g via ORAL
  Filled 2012-03-28: qty 120

## 2012-03-28 MED ORDER — PRO-STAT SUGAR FREE PO LIQD
30.0000 mL | Freq: Two times a day (BID) | ORAL | Status: DC
  Administered 2012-03-29 – 2012-04-01 (×7): 30 mL via ORAL
  Filled 2012-03-28 (×11): qty 30

## 2012-03-28 MED ORDER — SODIUM CHLORIDE 0.45 % IV SOLN
INTRAVENOUS | Status: DC
  Administered 2012-03-28 – 2012-03-29 (×2): via INTRAVENOUS
  Filled 2012-03-28 (×4): qty 75

## 2012-03-28 NOTE — Progress Notes (Signed)
Patient is extremely confused and becoming agitated at this time.  Has been pleasantly confused until this time.  Currently believes that her arm is "not my arm" and is questioning and suspicious of anyone who walks outside of her door.  Constantly having to re-orient patient to time, place, situation.  Will continue to monitor.

## 2012-03-28 NOTE — Progress Notes (Signed)
CARE MANAGEMENT NOTE 03/28/2012  Patient:  [name], [name]   Account Number:  [id number]  Date Initiated:  03/28/2012  Documentation initiated by:  DAVIS,RHONDA  Subjective/Objective Assessment:   patient admitted due toposs sepsis and ams     Action/Plan:   from home   Anticipated DC Date:  03/31/2012   Anticipated DC Plan:  HOME/SELF CARE  In-house referral  NA      DC Planning Services  NA      [facility] Choice  NA   Choice offered to / List presented to:  NA   DME arranged  NA      DME agency  NA     HH arranged  NA      HH agency  NA   Status of service:  In process, will continue to follow Medicare Important Message given?  NA - LOS <3 / Initial given by admissions (If response is "NO", the following Medicare IM given date fields will be blank) Date Medicare IM given:   Date Additional Medicare IM given:    Discharge Disposition:    Per UR Regulation:  Reviewed for med. necessity/level of care/duration of stay  If discussed at Long Length of Stay Meetings, dates discussed:    Comments:  [id number]/Rhonda Earlene Plater, RN, BSN, CCM: CHART REVIEWED AND UPDATED. NO DISCHARGE NEEDS PRESENT AT THIS TIME. CASE MANAGEMENT [phone]

## 2012-03-28 NOTE — Procedures (Signed)
Arterial Catheter Insertion Procedure Note AYAKA ANDES [id number] [birth year]  Procedure: Insertion of Arterial Catheter  Indications: Blood pressure monitoring  Procedure Details Consent: Unable to obtain consent because of altered level of consciousness. and medically nescessary  Time Out: Verified patient identification, verified procedure, site/side was marked, verified correct patient position, special equipment/implants available, medications/allergies/relevent history reviewed, required imaging and test results available.  Performed  Maximum sterile technique was used including antiseptics, cap, gloves, gown, hand hygiene, mask and sheet. Skin prep: Chlorhexidine; local anesthetic administered 22 gauge catheter was inserted into left radial artery using the Seldinger technique.  Evaluation Blood flow good; BP tracing good. Complications: No apparent complications.   Serena Croissant [facility] 03/28/2012

## 2012-03-28 NOTE — Consult Note (Signed)
WOC consult Note Reason for Consult:Admitted with wound vac in place (home unit) Wound type:Surgical Pressure Ulcer POA: No Measurement:Per Dr. Abbey Chatters, 14cm x 8cm x 3cm  Dressing procedure/placement/frequency: NPWT to midline wound with continuous pressure at .  RN may change dressing, every M-W-F.  Patient tolerated procedure well.  No complaints. I will remain available to this patient and her surgical staff but not follow.  Please re-consult if needed. Thanks, Ladona Mow, MSN, RN, [facility], CWOCN [phone])

## 2012-03-28 NOTE — Progress Notes (Signed)
INITIAL ADULT NUTRITION ASSESSMENT Date: 03/28/2012   Time: 11:23 AM Reason for Assessment: Consult for assessment of nutrition status/ requirements  ASSESSMENT: Female 61 y.o.  Dx: Suspected sepsis  Hx:  Past Medical History  Diagnosis Date  . Hypertension   . Colon polyps   . Stomach problems   . Hypokalemic alkalosis   . Allergic rhinitis   . GERD (gastroesophageal reflux disease)   . Stroke 2010    no residual problems  . Shortness of breath     due to anemia  . Anemia, iron deficiency 05/03/2011    recieves iron infusions  . Anemia   . GI bleeding   . Diverticulitis   . Transfusion history   . Difficulty sleeping     takes trazadone for sleep  . Peripheral vascular disease     Related Meds:  Scheduled Meds:   . sodium chloride   Intravenous Once  . sodium chloride   Intravenous STAT  . antiseptic oral rinse  15 mL Mouth Rinse q12n4p  . aztreonam  1 g Intravenous Q8H  . calcium gluconate  1 g Intravenous Once  . calcium gluconate  2 g Intravenous Once  . chlorhexidine  15 mL Mouth Rinse BID  . dextrose  1 ampule Intravenous Once  . dextrose  50 mL Intravenous Once  . fentaNYL  50 mcg Intravenous Once  . folic acid  1 mg Intravenous Daily  . heparin subcutaneous  5,000 Units Subcutaneous Q8H  . hydrocortisone sodium succinate  50 mg Intravenous Q6H  . insulin aspart  10 Units Intravenous Once  . insulin aspart  10 Units Subcutaneous Once  . LORazepam      . magnesium sulfate 1 - 4 g bolus IVPB  2 g Intravenous Once  . magnesium sulfate 1 - 4 g bolus IVPB  4 g Intravenous Once  . metronidazole  500 mg Intravenous Q8H  . pantoprazole (PROTONIX) IV  40 mg Intravenous Q24H  . sodium chloride  1,000 mL Intravenous Once  . sodium chloride  1,000 mL Intravenous Once  . sodium chloride  1,000 mL Intravenous Once  . sodium chloride  1,000 mL Intravenous Once  . sodium chloride  1,000 mL Intravenous Once  . sodium chloride  1,000 mL Intravenous Once  . sodium  chloride  500 mL Intravenous Once  . sodium chloride  500 mL Intravenous Once  . sodium polystyrene  30 g Oral Once  . sodium polystyrene  30 g Oral Once  . thiamine  100 mg Intravenous Daily  . vancomycin  1,000 mg Intravenous Once  . DISCONTD: aztreonam  1 g Intramuscular Q8H  . DISCONTD: dextrose  50 mL Intravenous Once  . DISCONTD: insulin regular  10 Units Intravenous Once  . DISCONTD: metronidazole  500 mg Intravenous Once   Continuous Infusions:   .  sodium bicarbonate infusion 1000 mL    . DISCONTD: sodium chloride    . DISCONTD: sodium chloride 150 mL/hr at 03/27/12 2101  . DISCONTD: albuterol Stopped (03/27/12 2007)   PRN Meds:.   Ht: 5\' 3"  (160 cm)  Wt: 164 lb 3.9 oz (74.5 kg)  Ideal Wt: 52.3 kg % Ideal Wt: 143% Wt Readings from Last 10 Encounters:  03/28/12 164 lb 3.9 oz (74.5 kg)  02/21/12 176 lb 9.4 oz (80.1 kg)  02/21/12 176 lb 9.4 oz (80.1 kg)  02/21/12 176 lb 9.4 oz (80.1 kg)  02/13/12 184 lb (83.462 kg)  01/11/12 182 lb 9.6 oz (82.827 kg)  12/20/11 184 lb (83.462 kg)  12/20/11 184 lb (83.462 kg)  12/20/11 184 lb (83.462 kg)  12/20/11 184 lb (83.462 kg)  *Weight down 20 lb over 3 months, 10.8% from baseline. Some weight loss could be attributed to changes in fluid status due to fluid loss from wound VAC.   Body mass index is 29.09 kg/(m^2). (Overweight)  Food/Nutrition Related Hx: Discussed patient in rounds. Per RN patient very confused. Unable to awake patient to obtain nutrition history. No family present. Noted patient with fowl smelling wound VAC. Patient's diet has been advanced from NPO to regular diet.   Labs:  CMP     Component Value Date/Time   NA 134* 03/28/2012 0500   K 6.0* 03/28/2012 0500   CL 110 03/28/2012 0500   CO2 14* 03/28/2012 0500   GLUCOSE 146* 03/28/2012 0500   BUN 57* 03/28/2012 0500   CREATININE 2.10* 03/28/2012 0500   CALCIUM 6.5* 03/28/2012 0500   PROT 7.8 03/27/2012 1545   ALBUMIN 2.6* 03/27/2012 1545   AST 24 03/27/2012  1545   ALT 20 03/27/2012 1545   ALKPHOS 58 03/27/2012 1545   BILITOT 0.3 03/27/2012 1545   GFRNONAA 25* 03/28/2012 0500   GFRAA 29* 03/28/2012 0500    Intake/Output Summary (Last 24 hours) at 03/28/12 1124 Last data filed at 03/28/12 0900  Gross per 24 hour  Intake   3900 ml  Output   2775 ml  Net   1125 ml     Diet Order: General  Supplements/Tube Feeding: none at this time   IVF:    sodium bicarbonate infusion 1000 mL   DISCONTD: sodium chloride   DISCONTD: sodium chloride Last Rate: 150 mL/hr at 03/27/12 2101  DISCONTD: albuterol Last Rate: Stopped (03/27/12 2007)    Estimated Nutritional Needs:   Kcal: 1550-1850 Protein: 90-110 grams  Fluid: 1 ml per kcal intake   NUTRITION DIAGNOSIS: -Increased nutrient needs (NI-5.1).  Status: Ongoing  RELATED TO: wound healing   AS EVIDENCE BY: pt with wound VAC  MONITORING/EVALUATION(Goals): PO intake, weights, labs, skin 1. PO intake > 75% at meals.   EDUCATION NEEDS: -No education needs identified at this time  INTERVENTION: 1. Will order patient Prostat BID, provides 200 kcal and 30 grams protein.  2. Will order patient Ensure once daily to provides 250 kcal and 9 grams of protein daily.  3. RD to follow for nutrition plan of care.   Dietitian [phone]  DOCUMENTATION CODES Per approved criteria  -Not Applicable    Iven Finn [facility] 03/28/2012, 11:23 AM

## 2012-03-28 NOTE — Progress Notes (Signed)
Subjective: More alert this morning.  Objective: Vital signs in last 24 hours: Temp:  [97.7 F (36.5 C)-99 F (37.2 C)] 98.3 F (36.8 C) (11/01 0800) Pulse Rate:  [75-95] 75  (11/01 0900) Resp:  [9-21] 12  (11/01 0900) BP: (68-144)/(36-110) 77/40 mmHg (11/01 0800) SpO2:  [95 %-100 %] 100 % (11/01 0900) Arterial Line BP: (75-105)/(33-57) 75/57 mmHg (11/01 0900) Weight:  [156 lb 1.4 oz (70.8 kg)-164 lb 3.9 oz (74.5 kg)] 164 lb 3.9 oz (74.5 kg) (11/01 0500) Last BM Date:  (Patient unable to answer question)  Intake/Output from previous day: 10/31 0701 - 11/01 0700 In: 3600 [I.V.:1200; IV Piggyback:2400] Out: 1925 [Urine:1925] Intake/Output this shift: Total I/O In: 300 [I.V.:300] Out: 850 [Urine:650; Drains:200]  PE: Abd-VAC removed; wound clean with 100% granulation tissue and measure approximately 14 cm long by 3 cm wide by 3 cm deep; VAC reapplied.  Lab Results:   Basename 03/28/12 0500 03/27/12 1545  WBC 6.4 16.5*  HGB 8.9* 11.4*  HCT 27.0* 33.8*  PLT 250 372   BMET  Basename 03/28/12 0500 03/27/12 2250  NA 134* 127*  K 6.0* 5.6*  CL 110 98  CO2 14* 18*  GLUCOSE 146* 108*  BUN 57* 72*  CREATININE 2.10* 3.49*  CALCIUM 6.5* 7.2*   PT/INR No results found for this basename: LABPROT:2,INR:2 in the last 72 hours Comprehensive Metabolic Panel:    Component Value Date/Time   NA 134* 03/28/2012 0500   K 6.0* 03/28/2012 0500   CL 110 03/28/2012 0500   CO2 14* 03/28/2012 0500   BUN 57* 03/28/2012 0500   CREATININE 2.10* 03/28/2012 0500   GLUCOSE 146* 03/28/2012 0500   CALCIUM 6.5* 03/28/2012 0500   AST 24 03/27/2012 1545   ALT 20 03/27/2012 1545   ALKPHOS 58 03/27/2012 1545   BILITOT 0.3 03/27/2012 1545   PROT 7.8 03/27/2012 1545   ALBUMIN 2.6* 03/27/2012 1545     Studies/Results: Dg Chest 2 View  03/27/2012  *RADIOLOGY REPORT*  Clinical Data: Altered mental status  CHEST - 2 VIEW  Comparison: None.  Findings: Normal mediastinum and cardiac silhouette.   Normal pulmonary  vasculature.  No evidence of effusion, infiltrate, or pneumothorax.  No acute bony abnormality.  Aorta is ectatic.  IMPRESSION: No acute cardiopulmonary process.   Original Report Authenticated By: Genevive Bi, M.D.    Ct Head Wo Contrast  03/27/2012  *RADIOLOGY REPORT*  Clinical Data: Altered mental status.  CT HEAD WITHOUT CONTRAST  Technique:  Contiguous axial images were obtained from the base of the skull through the vertex without contrast.  Comparison: Head CT 03/06/2011.  Findings: Mild cerebral and cerebellar atrophy.  Mild patchy areas of decreased attenuation throughout the deep and periventricular white matter of the cerebral hemispheres bilaterally is unchanged compared to the prior examination and most compatible with mild chronic microvascular ischemic disease.  No definite acute intracranial abnormalities.  Specifically, no definite signs of large vascular territory acute/subacute cerebral ischemia, no acute intracerebral hemorrhage, no focal mass, mass effect, hydrocephalus or abnormal intra or extra-axial fluid collections.  Visualized paranasal sinuses and mastoids are well pneumatized.  No acute displaced skull fractures are identified.  IMPRESSION: 1.  No acute intracranial abnormalities. 2.  Mild cerebral and cerebellar atrophy with mild chronic microvascular ischemic changes.   Original Report Authenticated By: Trudie Reed, M.D.     Anti-infectives: Anti-infectives     Start     Dose/Rate Route Frequency Ordered Stop   03/27/12 2200   aztreonam (AZACTAM) injection  1 g  Status:  Discontinued        1 g Intramuscular 3 times per day 03/27/12 1908 03/27/12 1920   03/27/12 2200   metroNIDAZOLE (FLAGYL) IVPB 500 mg        500 mg 100 mL/hr over 60 Minutes Intravenous Every 8 hours 03/27/12 2037     03/27/12 2030   aztreonam (AZACTAM) 1 g in dextrose 5 % 50 mL IVPB        1 g 100 mL/hr over 30 Minutes Intravenous 3 times per day 03/27/12 1920      03/27/12 2000   vancomycin (VANCOCIN) IVPB 1000 mg/200 mL premix        1,000 mg 200 mL/hr over 60 Minutes Intravenous  Once 03/27/12 1859 03/27/12 2059   03/27/12 1915   metroNIDAZOLE (FLAGYL) IVPB 500 mg  Status:  Discontinued        500 mg 100 mL/hr over 60 Minutes Intravenous  Once 03/27/12 1908 03/27/12 2124          Assessment Active Problems:  Post left colectomy with open abdominal wound healing with NPWT    LOS: 1 day   Plan: VAC change MWF.  Will see again on Monday.   Angeni Chaudhuri J 03/28/2012

## 2012-03-28 NOTE — Progress Notes (Signed)
Name: [name] MRN: [id number] DOB: 1950-07-27    LOS: 1  Referring Provider:  EDP Reason for Referral:  Suspected sepsis  PULMONARY / CRITICAL CARE MEDICINE  Brief patient description:  60 yo brought to [facility] ED with confusion, refusal to eat / drink (continued to take HCTZ / ACEI) and allow home care nurse to care for surgical wound.  Found to be hypotensive, in acute renal failure, hyperkalemic.  Events Since Admission: 10/31  CT head >> chronic atropy 10/31  CXR >>  Current Status:  Vital Signs: Temp:  [97.7 F (36.5 C)-99 F (37.2 C)] 98.3 F (36.8 C) (11/01 0800) Pulse Rate:  [78-95] 78  (11/01 0800) Resp:  [9-21] 13  (11/01 0800) BP: (68-144)/(36-110) 77/40 mmHg (11/01 0800) SpO2:  [95 %-100 %] 99 % (11/01 0800) Arterial Line BP: (77-105)/(33-55) 85/50 mmHg (11/01 0800) Weight:  [70.8 kg (156 lb 1.4 oz)-74.5 kg (164 lb 3.9 oz)] 74.5 kg (164 lb 3.9 oz) (11/01 0500)  Physical Examination: General:  Lying in bed, lethargic HEENT: NCAT, PERRL PULM: CTA B CV: Distant heart sounds, S1/S2 normal AB: BS+ infrequent, nontender, wound vac in place without surrounding cellulitis Ext: warm, edema in R leg, s/p L BKA Neuro: Awake, follows commands, oriented to name, hospital but not year or situation, slow to speak  Active Problems:  Renal failure, acute  Hyponatremia  Acute hyperkalemia  Dehydration  Acute encephalopathy  ASSESSMENT AND PLAN  NEUROLOGIC  A:  Acute encephalopathy, metabolic. Uncertain etiology; related to adrenal insufficiency? P:   Hold Neurontin / Trazodone Check B12/folate/TSH Check Ammonia   ENDOCRINE CBG (last 3)  No results found for this basename: GLUCAP:3 in the last 72 hours   A:  Adrenal insufficiency based on cortisol of 8.9 with BP in 80's, low Na, high K, low HCO3; Stim test not helpful now; likely primary adrenal insufficiency as mineralocorticoid activity limited (low Na, high K) and aldosterone not affected by ACTH/pituitary  axis P:   -HC 50mg  IV q6h -check TSH   PULMONARY No results found for this basename: PHART:5,PCO2:5,PCO2ART:5,PO2ART:5,HCO3:5,O2SAT:5 in the last 168 hours  Ventilator Settings:   CXR:  10/31 >>> nad ETT:  NA  A:  No active issues. P:   Goal SpO2 > 92 Supplemental oxygen PRN  CARDIOVASCULAR  Lab 03/27/12 2250 03/27/12 1620  TROPONINI <0.30 --  LATICACIDVEN -- 1.1  PROBNP -- --   ECG:  10/31 >>> 1st degree block, nonspecific ST-T changes Lines: NA  A: Hypotension in setting of severe dehydration, antihypertensives.  With low serum cortisol, low Na, high K, class IV RTA, picture consistent with adrenal insufficiency.  Less likely SIRS/sepsis P:  Hydrocortisone Hold Atenolol, HCTZ, Lisinopril Goal MAP > 60 Hold vasopressors considering improving renal function, high UOP; normal lactic acid  RENAL  Lab 03/28/12 0500 03/27/12 2250 03/27/12 1545  NA 134* 127* 123*  K 6.0* 5.6* --  CL 110 98 90*  CO2 14* 18* 20  BUN 57* 72* 80*  CREATININE 2.10* 3.49* 4.68*  CALCIUM 6.5* 7.2* 8.2*  MG 0.7* -- --  PHOS 4.7* -- --   Intake/Output      10/31 0701 - 11/01 0700 11/01 0701 - 11/02 0700   I.V. (mL/kg) 1200 (16.1) 150 (2)   IV Piggyback 2400    Total Intake(mL/kg) 3600 (48.3) 150 (2)   Urine (mL/kg/hr) 1925 (1.1) 650   Drains  200   Total Output 1925 850   Net +1675 -700  Foley:  10/31 >>>   A:  Acute renal failure / prerenal azotemia in the setting of severe dehydration and adrenal insufficiency. Hyperkalemia Non gap met acidosis, likely from adrenal insufficiency and NS  P:   Repeat Ca, Insulin, D50, Kayexalate and recheck BMET later today Change IVF to 1/2 NS with BiCarb  GASTROINTESTINAL  Lab 03/27/12 1545  AST 24  ALT 20  ALKPHOS 58  BILITOT 0.3  PROT 7.8  ALBUMIN 2.6*   A:  S/p exploratory lab.  Benign-appearing abdomen.  No evidence of wound infection. Protein calorie malnutrition P:   Advance diet Surgery consulted, evaluated in  ED, no concerns at this time  HEMATOLOGIC  Lab 03/28/12 0500 03/27/12 1545  HGB 8.9* 11.4*  HCT 27.0* 33.8*  PLT 250 372  INR -- --  APTT -- --   A:  Anemia of chronic disease. P:  Trend CBC  INFECTIOUS  Lab 03/28/12 0500 03/27/12 2250 03/27/12 1545  WBC 6.4 -- 16.5*  PROCALCITON <0.10 0.12 --   Cultures: 10/31  Blood >>> 10/31  Urine >>>  Antibiotics: 10/31  Flagyl >>> 10/31  Aztreonam >>>  A:  No clear evidence of infection.  PCN allergy. P:   Antibiotics / cultures as above PCT   BEST PRACTICE / DISPOSITION Level of Care:  ICU Primary Service:  PCCM Consultants:  CCS Code Status:  Full Diet:  NPO DVT Px:  Heparin GI Px:  Protonix Skin Integrity:  Intact Social / Family:  Family is not available  She remains critically ill with hypotension, altered mental status, hyperkalemic.  CC time this morning 40 minutes.  Max Fickle, M.D. Pulmonary and Critical Care Medicine [facility] Pager: [phone]  03/28/2012, 9:06 AM

## 2012-03-28 NOTE — Clinical Social Work Psychosocial (Addendum)
Clinical Social Work Department BRIEF PSYCHOSOCIAL ASSESSMENT 03/28/2012  Patient:  [name], [name]     Account Number:  [id number]     Admit date:  03/27/2012  Clinical Social Worker:  Jodelle Red  Date/Time:  03/28/2012 10:27 AM  Referred by:  Physician  Date Referred:  03/27/2012 Referred for  Behavioral Health Issues   Other Referral:   Pt IVC'd by family to get her admitted to the hospital. Pt not caring for herself properly at home and refused care per IVC.   Interview type:  Family Other interview type:   chart review, phone calls with niece, Harmon Pier and nephew, Cleotis Nipper  Pt not oriented enough for discussion per RN.    PSYCHOSOCIAL DATA Living Status:  ALONE Admitted from facility:   Level of care:   Primary support name:  Harmon Pier [phone] Primary support relationship to patient:  FAMILY Degree of support available:   good from extended family.  [facility], caregiver [phone] Cleotis Nipper, nephew [phone]    CURRENT CONCERNS Current Concerns  Post-Acute Placement  Adjustment to Illness   Other Concerns:    SOCIAL WORK ASSESSMENT / PLAN CSW spoke to extended family via phone to get some background information. Per niece and nephew, they both report they felt they had to IVC their aunt in order for her to get care. Per their report, Pt was not "doing right" and not making good choices about her care. Prior to surgery, they state she was able to do all her ADLs and was doing great. Per niece, "she was doing better than me".  Pt has had a CNA at night and hh checking on her. Family has rotated care and have other extended family with health concerns. They wanted Pt to seek SNF rehab after her surgery, but she refused. Per their report, Pt thought she would have to stay there forever. Niece has to help her own mother due to HD. Family denies any drug or ETOH use since surgery. They hope she will agree to SNF at d/c.  Pt has son as  closet relative, but per niece he is unreliable, unable to be located and has issues with mental illness. CSW suggested that Pt could benefit from [facility] paperwork completion prior to d/c and explained this process to niece.   Assessment/plan status:  Psychosocial Support/Ongoing Assessment of Needs Other assessment/ plan:   follow for HCPOA, and possible SNF at d/c.  reassess/meet with Pt when she is more oriented to assess further.   Information/referral to community resources:    PATIENT'S/FAMILY'S RESPONSE TO PLAN OF CARE: Extended family very concerned and supportive of Pt. CSW to follow and reassess. Pt could benefit from SNF for wound care on d/c to assist.   Doreen Salvage, LCSW ICU/Stepdown Clinical Social Worker [facility] Cell [phone] Hours 8am-1200pm M-F

## 2012-03-29 LAB — T3, FREE: T3, Free: 2.1 pg/mL — ABNORMAL LOW (ref 2.3–4.2)

## 2012-03-29 LAB — BASIC METABOLIC PANEL
Calcium: 7.9 mg/dL — ABNORMAL LOW (ref 8.4–10.5)
GFR calc Af Amer: 60 mL/min — ABNORMAL LOW (ref >90)
GFR calc non Af Amer: 52 mL/min — ABNORMAL LOW (ref >90)
Sodium: 139 mEq/L (ref 135–145)

## 2012-03-29 LAB — CBC WITH DIFFERENTIAL/PLATELET
Basophils Absolute: 0 10*3/uL (ref 0.0–0.1)
Basophils Relative: 0 % (ref 0–1)
Eosinophils Absolute: 0 10*3/uL (ref 0.0–0.7)
Eosinophils Relative: 0 % (ref 0–5)
MCH: 28.3 pg (ref 26.0–34.0)
MCV: 82.9 fL (ref 78.0–100.0)
Neutrophils Relative %: 84 % — ABNORMAL HIGH (ref 43–77)
Platelets: 269 10*3/uL (ref 150–400)
RDW: 15 % (ref 11.5–15.5)

## 2012-03-29 LAB — T4: T4, Total: 13.2 ug/dL — ABNORMAL HIGH (ref 5.0–12.5)

## 2012-03-29 MED ORDER — PANTOPRAZOLE SODIUM 40 MG PO TBEC
40.0000 mg | DELAYED_RELEASE_TABLET | Freq: Every day | ORAL | Status: DC
  Administered 2012-03-29 – 2012-04-01 (×4): 40 mg via ORAL
  Filled 2012-03-29 (×4): qty 1

## 2012-03-29 MED ORDER — SODIUM CHLORIDE 0.9 % IV SOLN
INTRAVENOUS | Status: DC
  Administered 2012-03-29: 12:00:00 via INTRAVENOUS
  Administered 2012-03-30: 75 mL/h via INTRAVENOUS

## 2012-03-29 MED ORDER — TRAZODONE HCL 100 MG PO TABS
100.0000 mg | ORAL_TABLET | Freq: Once | ORAL | Status: AC
  Administered 2012-03-30: 100 mg via ORAL
  Filled 2012-03-29: qty 1

## 2012-03-29 MED ORDER — ASPIRIN EC 81 MG PO TBEC
162.0000 mg | DELAYED_RELEASE_TABLET | Freq: Two times a day (BID) | ORAL | Status: DC
  Administered 2012-03-29 – 2012-04-01 (×6): 162 mg via ORAL
  Filled 2012-03-29 (×9): qty 2

## 2012-03-29 MED ORDER — FLUOXETINE HCL 20 MG PO CAPS
20.0000 mg | ORAL_CAPSULE | Freq: Every day | ORAL | Status: DC
  Administered 2012-03-29 – 2012-04-01 (×4): 20 mg via ORAL
  Filled 2012-03-29 (×4): qty 1

## 2012-03-29 NOTE — Progress Notes (Signed)
Received patient from ICU , alert pleasantly confused, restless, not on distress and  trying to climbed out of bed. Wound vac to midline incision intact and functioning well.[name]

## 2012-03-29 NOTE — Progress Notes (Signed)
Name: [name] MRN: [id number] DOB: 1951/02/02    LOS: 2  Referring Provider:  EDP Reason for Referral:  Suspected sepsis  PULMONARY / CRITICAL CARE MEDICINE  Brief patient description:   61 yo female forme smoker transferred from NH on 03/27/2012 with hypotension, acute renal failure, and hypokalemia with concern for adrenal insufficiency.  Had Lt colectomy 02/15/12 with open wound and wound vac. Significant PMHx HTN, GERD, CVA, Anemia, PVD s/p Lt AKA  Events Since Admission: 10/31  CT head >> chronic atropy  Current Status: More alert.  BP improved.  Vital Signs: Temp:  [97.9 F (36.6 C)-99.6 F (37.6 C)] 98.8 F (37.1 C) (11/02 0800) Pulse Rate:  [72-87] 80  (11/02 1000) Resp:  [12-18] 12  (11/02 1000) BP: (84-128)/(46-78) 127/74 mmHg (11/02 1000) SpO2:  [99 %-100 %] 100 % (11/02 1000) Arterial Line BP: (134)/(72) 134/72 mmHg (11/01 1100) Weight:  [160 lb 15 oz (73 kg)] 160 lb 15 oz (73 kg) (11/02 0400)  Physical Examination: General:  No distress HEENT: no sinus tenderness PULM: no wheeze/rales CV: s1s2 regular AB: soft, non tender, wound vac in place Ext: s/p LT AKA, no edema Neuro: Alert, follows commands  Active Problems:  Renal failure, acute  Hyponatremia  Acute hyperkalemia  Dehydration  Acute encephalopathy  ASSESSMENT AND PLAN  NEUROLOGIC  A:  Acute encephalopathy likely from hypotension and acute renal failure>>much improved. P:   Hold Neurontin / Trazodone   ENDOCRINE CBG (last 3)  No results found for this basename: GLUCAP:3 in the last 72 hours  Cortisol 11/01>>8.5  Lab Results  Component Value Date   TSH 0.079* 03/28/2012   A:   Adrenal insufficiency. Hyperthyroidism. P:   -HC 50mg  IV q6h -check FT4, T3   CARDIOVASCULAR  Lab 03/27/12 2250 03/27/12 1620  TROPONINI <0.30 --  LATICACIDVEN -- 1.1  PROBNP -- --   ECG:  10/31 >>> 1st degree block, nonspecific ST-T changes Lines: NA  A: Hypotension in setting of severe  dehydration, antihypertensives.  With low serum cortisol, low Na, high K, class IV RTA, picture consistent with adrenal insufficiency. P:  Hold Atenolol, HCTZ, Lisinopril D/c a line Continue IV fluids  RENAL  Lab 03/29/12 0454 03/28/12 1250 03/28/12 0500 03/27/12 2250 03/27/12 1545  NA 139 135 134* 127* 123*  K 3.8 5.4* -- -- --  CL 110 111 110 98 90*  CO2 19 15* 14* 18* 20  BUN 42* 50* 57* 72* 80*  CREATININE 1.14* 1.50* 2.10* 3.49* 4.68*  CALCIUM 7.9* 7.2* 6.5* 7.2* 8.2*  MG -- -- 0.7* -- --  PHOS -- -- 4.7* -- --   Intake/Output      11/01 0701 - 11/02 0700 11/02 0701 - 11/03 0700   P.O. 340    I.V. (mL/kg) 2100 (28.8) 300 (4.1)   IV Piggyback 460    Total Intake(mL/kg) 2900 (39.7) 300 (4.1)   Urine (mL/kg/hr) 2290 (1.3) 60   Drains 200    Total Output 2490 60   Net +410 +240        Stool Occurrence 3 x 1 x    Foley:  10/31 >>>   A:  Acute renal failure / prerenal azotemia in the setting of severe dehydration and adrenal insufficiency. Hyperkalemia Non gap met acidosis, likely from adrenal insufficiency and NS P:   F/u BMET, monitor urine outpt D/c HCO3 from IV fluid  GASTROINTESTINAL  Lab 03/27/12 1545  AST 24  ALT 20  ALKPHOS 58  BILITOT 0.3  PROT 7.8  ALBUMIN 2.6*   A:  S/p exploratory lab.  Benign-appearing abdomen.  No evidence of wound infection. Protein calorie malnutrition P:   Regular diet Surgery following for wound care  HEMATOLOGIC  Lab 03/29/12 0454 03/28/12 0500 03/27/12 1545  HGB 8.3* 8.9* 11.4*  HCT 24.3* 27.0* 33.8*  PLT 269 250 372  INR -- -- --  APTT -- -- --   A:  Anemia of chronic disease. P:  Trend CBC  INFECTIOUS  Lab 03/29/12 0454 03/28/12 0500 03/27/12 2250 03/27/12 1545  WBC 4.9 6.4 -- 16.5*  PROCALCITON <0.10 <0.10 0.12 --   Cultures: 10/31  Blood >>> 10/31  Urine >>>GNR>>  Antibiotics: 10/31  Flagyl >>>11/02 10/31  Aztreonam >>>  A:  UTI.  PCN, Cipro allergy. P:   D/c flagyl Continue aztreonam  pending cx results   BEST PRACTICE / DISPOSITION Level of Care:  Telemetry Primary Service:  Transfer to [facility] 11/03 and PCCM sign off Consultants:  CCS Code Status:  Full Diet:  Regular DVT Px:  Heparin GI Px:  Not indicated  Terrell Ostrand, M.D. Pulmonary and Critical Care Medicine [facility] Pager: [phone]  03/29/2012, 10:24 AM

## 2012-03-30 DIAGNOSIS — R1032 Left lower quadrant pain: Secondary | ICD-10-CM

## 2012-03-30 DIAGNOSIS — N390 Urinary tract infection, site not specified: Secondary | ICD-10-CM | POA: Diagnosis present

## 2012-03-30 LAB — BASIC METABOLIC PANEL
BUN: 38 mg/dL — ABNORMAL HIGH (ref 6–23)
Calcium: 8.1 mg/dL — ABNORMAL LOW (ref 8.4–10.5)
Chloride: 113 mEq/L — ABNORMAL HIGH (ref 96–112)
Creatinine, Ser: 1.08 mg/dL (ref 0.50–1.10)
GFR calc Af Amer: 64 mL/min — ABNORMAL LOW (ref >90)

## 2012-03-30 LAB — CBC
HCT: 23.2 % — ABNORMAL LOW (ref 36.0–46.0)
MCHC: 33.6 g/dL (ref 30.0–36.0)
MCV: 84.1 fL (ref 78.0–100.0)
Platelets: 283 10*3/uL (ref 150–400)
RDW: 15.1 % (ref 11.5–15.5)

## 2012-03-30 MED ORDER — HYDROCORTISONE SOD SUCCINATE 100 MG IJ SOLR
50.0000 mg | Freq: Two times a day (BID) | INTRAMUSCULAR | Status: DC
  Administered 2012-03-31 – 2012-04-01 (×2): 50 mg via INTRAVENOUS
  Filled 2012-03-30 (×5): qty 1

## 2012-03-30 MED ORDER — POTASSIUM CHLORIDE CRYS ER 20 MEQ PO TBCR
40.0000 meq | EXTENDED_RELEASE_TABLET | Freq: Once | ORAL | Status: AC
  Administered 2012-03-30: 40 meq via ORAL
  Filled 2012-03-30: qty 2

## 2012-03-30 MED ORDER — MAGNESIUM SULFATE 40 MG/ML IJ SOLN
2.0000 g | Freq: Once | INTRAMUSCULAR | Status: AC
  Administered 2012-03-30: 2 g via INTRAVENOUS
  Filled 2012-03-30: qty 50
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
## 2012-03-30 NOTE — Evaluation (Signed)
Physical Therapy Evaluation Patient Details Name: [name] MRN: [id number] DOB: May 02, 1951 Today's Date: 03/30/2012 Time: 1210-1240 PT Time Calculation (min): 30 min  PT Assessment / Plan / Recommendation Clinical Impression  61 yo female with multiple medical problems including remote AKA and lapaotomy with VAC for closure on 02/16/12 admitted with AMS and ARF.  Pt is not able to follow commands or answer questions for assessment of how she was functioning at home or if she used a prosthesis.  She was able to stand and use residual limb for support so I do feel that she has potential to progress in fuctional independence.  She will need 24/7 assist and follow up PT     PT Assessment  Patient needs continued PT services    Follow Up Recommendations  Post acute inpatient;Supervision/Assistance - 24 hour    Does the patient have the potential to tolerate intense rehabilitation   No, Recommend SNF  Barriers to Discharge Decreased caregiver support      Equipment Recommendations  None recommended by PT    Recommendations for Other Services OT consult   Frequency Min 3X/week    Precautions / Restrictions     Pertinent Vitals/Pain No c/o pain      Mobility  Bed Mobility Bed Mobility: Supine to Sit Supine to Sit: 1: +2 Total assist;HOB elevated Supine to Sit: Patient Percentage: 70% Details for Bed Mobility Assistance: scooted pt around on pad to get her to comply with activity,  Once she was up, she was able to scoot herself around. Extra time for all activites Transfers Transfers: Sit to Stand;Stand to Sit Sit to Stand: 4: Min assist Stand to Sit: 4: Min assist Details for Transfer Assistance: Extra time needed for pt to comply, Pt wanted to do it herself and needed min assist for safety Pt stood with RW on RLE with weight bearring trought end of LLE residual limb on mattress Ambulation/Gait Ambulation/Gait Assistance: Not tested (comment) Assistive device: Rolling  walker    Shoulder Instructions     Exercises     PT Diagnosis: Generalized weakness  PT Problem List: Decreased activity tolerance;Decreased safety awareness;Decreased cognition PT Treatment Interventions: DME instruction;Functional mobility training;Therapeutic activities;Therapeutic exercise   PT Goals Acute Rehab PT Goals PT Goal Formulation: Patient unable to participate in goal setting Time For Goal Achievement: 04/13/12 Potential to Achieve Goals: Good Pt will go Supine/Side to Sit: with supervision PT Goal: Supine/Side to Sit - Progress: Goal set today Pt will go Sit to Supine/Side: with supervision PT Goal: Sit to Supine/Side - Progress: Goal set today Pt will Transfer Bed to Chair/Chair to Bed: with min assist PT Transfer Goal: Bed to Chair/Chair to Bed - Progress: Goal set today Pt will Stand: 3 - 5 min;with supervision PT Goal: Stand - Progress: Goal set today  Visit Information  Last PT Received On: 03/30/12 Assistance Needed: +2    Subjective Data  Subjective: "you sure are nosey" Patient Stated Goal: none stated   Prior Functioning  Home Living Lives With: Alone Home Adaptive Equipment: Walker - rolling;Wheelchair - manual Additional Comments: Pt did not answer all questions about prior function Prior Function Level of Independence: Independent with assistive device(s) (unable to determine how much pt used prosthesis) Communication Communication: No difficulties    Cognition  Overall Cognitive Status: Impaired Area of Impairment: Memory;Following commands;Safety/judgement;Awareness of errors;Awareness of deficits;Problem solving Arousal/Alertness: Awake/alert Orientation Level: Disoriented X4 Behavior During Session: Restless Memory Deficits: pt not able to stay attended to task Following  Commands: Follows one step commands inconsistently Safety/Judgement: Decreased awareness of safety precautions;Decreased safety judgement for tasks  assessed;Impulsive Safety/Judgement - Other Comments: extra time to perform get to edge of bed, then extra time to return to supine Awareness of Deficits: pt did not know what IV tubing or VAC tubing was  Problem Solving: pt unable to consider safety in problem solving... She has AKA and did not have prosthesis but wanted to walk without a destination in mind    Extremity/Trunk Assessment Right Lower Extremity Assessment RLE ROM/Strength/Tone: Deficits RLE ROM/Strength/Tone Deficits: appears to have geralized weakness Left Lower Extremity Assessment LLE ROM/Strength/Tone: Deficits LLE ROM/Strength/Tone Deficits: well healed long length AKA residual limb Trunk Assessment Trunk Assessment:  (vac in midline that  i did not assess)   Balance Balance Balance Assessed: Yes Static Sitting Balance Static Sitting - Balance Support: No upper extremity supported Static Sitting - Level of Assistance: 7: Independent Static Sitting - Comment/# of Minutes: 10 Static Standing Balance Static Standing - Balance Support: Bilateral upper extremity supported Static Standing - Level of Assistance: 4: Min assist Static Standing - Comment/# of Minutes: 30 seconds to assess standing.  She stood on end of LLE residual limb. Question if pt uses prosthesis at home or if she just transfers to W/C  End of Session PT - End of Session Activity Tolerance: Treatment limited secondary to medical complications (Comment) (pt unable to follow structured commands) Patient left: in bed;with bed alarm set Nurse Communication: Mobility status  GP     Bayard Hugger. Manson Passey, [place] [phone] 03/30/2012, 2:08 PM

## 2012-03-30 NOTE — Progress Notes (Signed)
Writer spoke with MD at this time regarding pt pulling out another IV.  Second one today.  Pt continues to pull at tubes and tries to get OOB at times. MD stated that she would try to change meds to PO and that it was OK to leave IV out r/t pt's noncompliance. IV has looked/stuck pt 4 times today already and will not try again. Pt without s/s of distress; no c/o. Staff will continue to monitor pt closely.

## 2012-03-30 NOTE — Progress Notes (Signed)
ANTIBIOTIC CONSULT NOTE - FOLLOW UP  Pharmacy Consult for Aztreonam Indication: rule out sepsis, UTI  Allergies  Allergen Reactions  . Ciprofloxacin Hives and Swelling  . Morphine And Related Other (See Comments)    Makes pt sad and paranoid  . Penicillins Hives and Swelling  . Codeine Hives and Swelling    Patient Measurements: Height: 5\' 3"  (160 cm) Weight: 163 lb 9.3 oz (74.2 kg) IBW/kg (Calculated) : 52.4   Vital Signs: Temp: 98.6 F (37 C) (11/03 0605) Temp src: Oral (11/03 0605) BP: 148/69 mmHg (11/03 0605) Pulse Rate: 66  (11/03 0605)  Labs:  Basename 03/30/12 0517 03/29/12 0454 03/28/12 1250 03/28/12 0500  WBC 5.5 4.9 -- 6.4  HGB 7.8* 8.3* -- 8.9*  PLT 283 269 -- 250  LABCREA -- -- -- --  CREATININE 1.08 1.14* 1.50* --   Estimated Creatinine Clearance: 54.1 ml/min (by C-G formula based on Cr of 1.08).  Microbiology: Recent Results (from the past 720 hour(s))  CULTURE, BLOOD (ROUTINE X 2)     Status: Normal   Collection Time   03/05/12 11:30 AM      Component Value Range Status Comment   Specimen Description BLOOD LEFT HAND   Final    Special Requests BOTTLES DRAWN AEROBIC AND ANAEROBIC   Final    Culture  Setup Time 03/05/2012 15:21   Final    Culture NO GROWTH 5 DAYS   Final    Report Status 03/11/2012 FINAL   Final   CULTURE, BLOOD (ROUTINE X 2)     Status: Normal   Collection Time   03/05/12 11:35 AM      Component Value Range Status Comment   Specimen Description BLOOD RIGHT ANTECUBITAL   Final    Special Requests BOTTLES DRAWN AEROBIC ONLY   Final    Culture  Setup Time 03/05/2012 15:21   Final    Culture NO GROWTH 5 DAYS   Final    Report Status 03/11/2012 FINAL   Final   URINE CULTURE     Status: Normal   Collection Time   03/05/12  4:47 PM      Component Value Range Status Comment   Specimen Description URINE, CATHETERIZED   Final    Special Requests Normal   Final    Culture  Setup Time 03/06/2012 02:18   Final    Colony Count NO  GROWTH   Final    Culture NO GROWTH   Final    Report Status 03/07/2012 FINAL   Final   CULTURE, BLOOD (ROUTINE X 2)     Status: Normal (Preliminary result)   Collection Time   03/27/12  3:45 PM      Component Value Range Status Comment   Specimen Description BLOOD RIGHT ARM   Final    Special Requests BOTTLES DRAWN AEROBIC AND ANAEROBIC 5CC EACH   Final    Culture  Setup Time 03/28/2012 00:21   Final    Culture     Final    Value:        BLOOD CULTURE RECEIVED NO GROWTH TO DATE CULTURE WILL BE HELD FOR 5 DAYS BEFORE ISSUING A FINAL NEGATIVE REPORT   Report Status PENDING   Incomplete   CULTURE, BLOOD (ROUTINE X 2)     Status: Normal (Preliminary result)   Collection Time   03/27/12  4:20 PM      Component Value Range Status Comment   Specimen Description BLOOD LEFT ARM   Final  Special Requests BOTTLES DRAWN AEROBIC AND ANAEROBIC 3CC EACH   Final    Culture  Setup Time 03/28/2012 00:21   Final    Culture     Final    Value:        BLOOD CULTURE RECEIVED NO GROWTH TO DATE CULTURE WILL BE HELD FOR 5 DAYS BEFORE ISSUING A FINAL NEGATIVE REPORT   Report Status PENDING   Incomplete   URINE CULTURE     Status: Normal   Collection Time   03/27/12  5:58 PM      Component Value Range Status Comment   Specimen Description URINE, RANDOM   Final    Special Requests NONE   Final    Culture  Setup Time 03/28/2012 01:38   Final    Colony Count >=100,000 COLONIES/ML   Final    Culture KLEBSIELLA PNEUMONIAE   Final    Report Status 03/29/2012 FINAL   Final    Organism ID, Bacteria KLEBSIELLA PNEUMONIAE   Final   MRSA PCR SCREENING     Status: Abnormal   Collection Time   03/27/12  9:39 PM      Component Value Range Status Comment   MRSA by PCR POSITIVE (*) NEGATIVE Final     Anti-infectives     Start     Dose/Rate Route Frequency Ordered Stop   03/27/12 2200   aztreonam (AZACTAM) injection 1 g  Status:  Discontinued        1 g Intramuscular 3 times per day 03/27/12 1908 03/27/12 1920     03/27/12 2200   metroNIDAZOLE (FLAGYL) IVPB 500 mg  Status:  Discontinued        500 mg 100 mL/hr over 60 Minutes Intravenous Every 8 hours 03/27/12 2037 03/29/12 1028   03/27/12 2030   aztreonam (AZACTAM) 1 g in dextrose 5 % 50 mL IVPB        1 g 100 mL/hr over 30 Minutes Intravenous 3 times per day 03/27/12 1920     03/27/12 2000   vancomycin (VANCOCIN) IVPB 1000 mg/200 mL premix        1,000 mg 200 mL/hr over 60 Minutes Intravenous  Once 03/27/12 1859 03/27/12 2059   03/27/12 1915   metroNIDAZOLE (FLAGYL) IVPB 500 mg  Status:  Discontinued        500 mg 100 mL/hr over 60 Minutes Intravenous  Once 03/27/12 1908 03/27/12 2124          Assessment:  [name] 10/[birth year] with altered mental status, hypotension, acute renal failure, hyperkalemia, and suspected sepsis.  Partial colectomy performed 02/15/12 due to lower GI bleeding.  Wound vac on, no concerns for wound infection at this time.   10/31 Urine cxt > 100,000 Klebsiella (sens: cefazolin, ceftriaxone, cipro, gent, levo, zosyn).   Aztreonam sensitivity NOT tested.  Called micro lab and asked them to add on testing for Aztreonam.  This may take several days to result.  Renal function continues to improve.  Goals of Therapy:  Antibiotic doses appropriate for renal function Eradication of infection  Plan:   Continue Aztreonam 1 gram IV every 8 hours.  Follow up renal fxn and culture results.   Lynann Beaver PharmD, BCPS Pager [phone] 03/30/2012 1:35 PM

## 2012-03-30 NOTE — Progress Notes (Signed)
Patient ID: [name], female   DOB: [birth year], 61 y.o.   MRN: [id number]  [facility] PROGRESS NOTE  RAEVIN WIERENGA WUJ:[id number] DOB: [birth year] DOA: 03/27/2012 PCP: Sheila Oats, MD  Brief narrative: Pt is 61 yo female former smoker with HTN, GERD, CVA, Anemia, PVD s/p left AKA transferred from NH on 03/27/2012 with hypotension, acute renal failure, and hypokalemia with concern for adrenal insufficiency. Had left colectomy 02/15/12 with open wound and wound vac. Surgery also involved and on 11/01 VAC was removed; wound was reported by surgery team to be clean with 100% granulation tissue and measured approximately 14 cm long by 3 cm wide by 3 cm deep; VAC reapplied at that time as well.   Active Problems: Principal Problem:  *Acute encephalopathy - likely multifactorial and secondary to adrenal insufficiency, progressive failure to thrive and deconditioning, acute UTI  - appeared to be resolving but this morning pt more confused - will continue Fluoxetine - neuro check per protocol Active Problems:  Adrenal insufficiency - change Hydrocortisone to Prednisone   Renal failure, acute - secondary to dehydration - pt still on IVF - creatinine is now within normal limits - BMP in AM  UTI - Klebsiella noted er urine cultures - Continue Aztreonam  Acute hyperkalemia - this was noted on blood work upon admission - now resolved and potassium is even on low side this Am - will supplement this AM  DEPRESSION, ACUTE - appears more depressed this AM - continue Fluoxetine  Anemia, iron deficiency - chronic - will obtain CBC in AM - transfuse if Hg is < 7.5 - CBC in AM  Hyponatremia - secondary to pre renal etiology in the setting of dehydration  - now resolved - BMP in AM  Dehydration - in the setting of adrenal insufficiency - resolved - d/c IVF  Hypomagnesemia  - supplemented - Mg in AM  Consultants:  PCCM initially admitted under their service and pt  transferred to [facility] for further management 03/30/2012  Surgery  Procedures/Studies:  CT head 10/31 --> chronic atrophy  Chest XRAY 10/31 --> no acute cardiopulmonary events  Antibiotics:  Aztreonam 10/31 -->  Code Status: Full Family Communication: Pt at bedside Disposition Plan: SNF when medically ready   HPI/Subjective: Pt appears to be more confused this AM and not answering questions appropriately.   Objective: Filed Vitals:   03/29/12 1200 03/29/12 1300 03/29/12 2203 03/30/12 0605  BP: 135/67  155/72 148/69  Pulse: 66 104 72 66  Temp: 98.8 F (37.1 C)  99 F (37.2 C) 98.6 F (37 C)  TempSrc: Oral  Oral Oral  Resp: 14 11 18 20   Height:      Weight:    74.2 kg (163 lb 9.3 oz)  SpO2: 100%  99% 100%    Intake/Output Summary (Last 24 hours) at 03/30/12 1358 Last data filed at 03/30/12 1230  Gross per 24 hour  Intake    450 ml  Output    800 ml  Net   -350 ml    Exam:   General:  Pt is alert, appears confused and follows some commands appropriately, no answering questions appropriately   Cardiovascular: Regular rate and rhythm, S1/S2, no murmurs, no rubs, no gallops  Respiratory: Clear to auscultation bilaterally, no wheezing, no crackles, no rhonchi  Abdomen: Soft, non tender, non distended, bowel sounds present, no guarding  Extremities: Trace right pitting lower extremity edema, left AKA  Neuro: Grossly nonfocal  Data Reviewed: Basic Metabolic Panel:  Lab 03/30/12  1610 03/29/12 0454 03/28/12 1250 03/28/12 0500 03/27/12 2250  NA 144 139 135 134* 127*  K 3.3* 3.8 5.4* 6.0* 5.6*  CL 113* 110 111 110 98  CO2 21 19 15* 14* 18*  GLUCOSE 150* 184* 196* 146* 108*  BUN 38* 42* 50* 57* 72*  CREATININE 1.08 1.14* 1.50* 2.10* 3.49*  CALCIUM 8.1* 7.9* 7.2* 6.5* 7.2*  MG 1.4* -- -- 0.7* --  PHOS 3.7 -- -- 4.7* --   Liver Function Tests:  Lab 03/27/12 1545  AST 24  ALT 20  ALKPHOS 58  BILITOT 0.3  PROT 7.8  ALBUMIN 2.6*    Lab 03/27/12 1545    LIPASE 107*  AMYLASE --    Lab 03/28/12 1250  AMMONIA 34   CBC:  Lab 03/30/12 0517 03/29/12 0454 03/28/12 0500 03/27/12 1545  WBC 5.5 4.9 6.4 16.5*  NEUTROABS -- 4.1 -- --  HGB 7.8* 8.3* 8.9* 11.4*  HCT 23.2* 24.3* 27.0* 33.8*  MCV 84.1 82.9 85.7 84.1  PLT 283 269 250 372   Cardiac Enzymes:  Lab 03/27/12 2250  CKTOTAL --  CKMB --  CKMBINDEX --  TROPONINI <0.30    Recent Results (from the past 240 hour(s))  CULTURE, BLOOD (ROUTINE X 2)     Status: Normal (Preliminary result)   Collection Time   03/27/12  3:45 PM      Component Value Range Status Comment   Specimen Description BLOOD RIGHT ARM   Final    Special Requests BOTTLES DRAWN AEROBIC AND ANAEROBIC 5CC EACH   Final    Culture  Setup Time 03/28/2012 00:21   Final    Culture     Final    Value:        BLOOD CULTURE RECEIVED NO GROWTH TO DATE CULTURE WILL BE HELD FOR 5 DAYS BEFORE ISSUING A FINAL NEGATIVE REPORT   Report Status PENDING   Incomplete   CULTURE, BLOOD (ROUTINE X 2)     Status: Normal (Preliminary result)   Collection Time   03/27/12  4:20 PM      Component Value Range Status Comment   Specimen Description BLOOD LEFT ARM   Final    Special Requests BOTTLES DRAWN AEROBIC AND ANAEROBIC 3CC EACH   Final    Culture  Setup Time 03/28/2012 00:21   Final    Culture     Final    Value:        BLOOD CULTURE RECEIVED NO GROWTH TO DATE CULTURE WILL BE HELD FOR 5 DAYS BEFORE ISSUING A FINAL NEGATIVE REPORT   Report Status PENDING   Incomplete   URINE CULTURE     Status: Normal   Collection Time   03/27/12  5:58 PM      Component Value Range Status Comment   Specimen Description URINE, RANDOM   Final    Special Requests NONE   Final    Culture  Setup Time 03/28/2012 01:38   Final    Colony Count >=100,000 COLONIES/ML   Final    Culture KLEBSIELLA PNEUMONIAE   Final    Report Status 03/29/2012 FINAL   Final    Organism ID, Bacteria KLEBSIELLA PNEUMONIAE   Final   MRSA PCR SCREENING     Status: Abnormal    Collection Time   03/27/12  9:39 PM      Component Value Range Status Comment   MRSA by PCR POSITIVE (*) NEGATIVE Final      Scheduled Meds:   . aspirin EC  162 mg Oral BID  . aztreonam  1 g Intravenous Q8H  . FLUoxetine  20 mg Oral Daily  . heparin subcutaneous  5,000 Units Subcutaneous Q8H  . hydrocortisone sodi  50 mg Intravenous Q6H  . pantoprazole  40 mg Oral Daily   Continuous Infusions:   . sodium chloride 75 mL/hr (03/30/12 1200)     Debbora Presto, MD  [facility] Pager [phone]  If 7PM-7AM, please contact night-coverage [email or web address] Password [email or web address] 03/30/2012, 1:58 PM   LOS: 3 days

## 2012-03-31 ENCOUNTER — Other Ambulatory Visit: Payer: Medicare Other | Admitting: Lab

## 2012-03-31 ENCOUNTER — Ambulatory Visit: Payer: Medicare Other | Admitting: Oncology

## 2012-03-31 LAB — FOLATE RBC: RBC Folate: 764 ng/mL — ABNORMAL HIGH (ref >=366)

## 2012-03-31 LAB — BASIC METABOLIC PANEL
BUN: 34 mg/dL — ABNORMAL HIGH (ref 6–23)
Calcium: 7.8 mg/dL — ABNORMAL LOW (ref 8.4–10.5)
Creatinine, Ser: 1.08 mg/dL (ref 0.50–1.10)
GFR calc non Af Amer: 55 mL/min — ABNORMAL LOW (ref >90)
Glucose, Bld: 107 mg/dL — ABNORMAL HIGH (ref 70–99)

## 2012-03-31 LAB — CBC
HCT: 22.2 % — ABNORMAL LOW (ref 36.0–46.0)
Hemoglobin: 7.4 g/dL — ABNORMAL LOW (ref 12.0–15.0)
MCH: 28.6 pg (ref 26.0–34.0)
MCHC: 33.3 g/dL (ref 30.0–36.0)

## 2012-03-31 LAB — PREPARE RBC (CROSSMATCH)

## 2012-03-31 MED ORDER — POTASSIUM CHLORIDE CRYS ER 20 MEQ PO TBCR
40.0000 meq | EXTENDED_RELEASE_TABLET | Freq: Two times a day (BID) | ORAL | Status: AC
  Administered 2012-03-31 (×2): 40 meq via ORAL
  Filled 2012-03-31 (×3): qty 2

## 2012-03-31 MED ORDER — MAGNESIUM SULFATE 40 MG/ML IJ SOLN
4.0000 g | Freq: Once | INTRAMUSCULAR | Status: AC
  Administered 2012-03-31: 4 g via INTRAVENOUS
  Filled 2012-03-31: qty 100

## 2012-03-31 MED ORDER — MUPIROCIN 2 % EX OINT
1.0000 "application " | TOPICAL_OINTMENT | Freq: Two times a day (BID) | CUTANEOUS | Status: DC
  Administered 2012-03-31 – 2012-04-01 (×2): 1 via NASAL
  Filled 2012-03-31: qty 22

## 2012-03-31 MED ORDER — CHLORHEXIDINE GLUCONATE CLOTH 2 % EX PADS
6.0000 | MEDICATED_PAD | Freq: Every day | CUTANEOUS | Status: DC
  Administered 2012-04-01: 6 via TOPICAL

## 2012-03-31 NOTE — Progress Notes (Signed)
PT Cancellation Note  Patient Details Name: [name] MRN: [id number] DOB: 1950/08/25   Cancelled Treatment:    Reason Eval/Treat Not Completed: Other (comment) (Pts refusal to participate with therapy. )  Will check back with pt as schedule permits.    Thanks,    Page, Meribeth Mattes 03/31/2012, 2:38 PM

## 2012-03-31 NOTE — Progress Notes (Signed)
Patient ID: [name], female   DOB: May 21, 1951, 61 y.o.   MRN: [id number]  [facility] PROGRESS NOTE  ILHAN DEBENEDETTO EXB:[id number] DOB: 1950/09/15 DOA: 03/27/2012 PCP: Sheila Oats, MD  Brief narrative:  Pt is 61 yo female former smoker with HTN, GERD, CVA, Anemia, PVD s/p left AKA transferred from NH on 03/27/2012 with hypotension, acute renal failure, and hypokalemia with concern for adrenal insufficiency. Had left colectomy 02/15/12 with open wound and wound vac. Surgery also involved and on 11/01 VAC was removed; wound was reported by surgery team to be clean with 100% granulation tissue and measured approximately 14 cm long by 3 cm wide by 3 cm deep; VAC reapplied at that time as well.   Active Problems:  Principal Problem:  *Acute encephalopathy  - likely multifactorial and secondary to adrenal insufficiency, progressive failure to thrive and deconditioning, acute UTI  - appeared to be resolving, improved since yesterday in terms of mental status - will obtain Psych consultation as pt has intermittent episodes of psychotic delirium and has been refusing medications - pt has initially been placed by family IVC - will continue Fluoxetine  - neuro check per protocol  Active Problems:  Adrenal insufficiency  - change Hydrocortisone to Prednisone  Renal failure, acute  - secondary to dehydration  - pt still on IVF  - creatinine is now within normal limits  - BMP in AM  UTI  - Klebsiella noted er urine cultures  - Continue Aztreonam  Acute hyperkalemia  - this was noted on blood work upon admission  - now resolved and potassium is even on low side this Am  - pt has refused the supplementation yesterday  - will supplement this AM  DEPRESSION, ACUTE  - appears more depressed this AM  - continue Fluoxetine  Anemia, iron deficiency  - chronic but Hg did drop since yesterday  - transfuse 2 units PRBC - CBC in AM  Hyponatremia  - secondary to pre renal etiology in  the setting of dehydration  - now resolved  - BMP in AM  Dehydration  - in the setting of adrenal insufficiency  - resolved  - d/c IVF  Hypomagnesemia  - pt refused supplementation yesterday - will try today - Mg in AM '  Consultants:  PCCM initially admitted under their service and pt transferred to [facility] for further management 03/30/2012  Surgery  Procedures/Studies:  CT head 10/31 --> chronic atrophy  Chest XRAY 10/31 --> no acute cardiopulmonary events  Antibiotics:  Aztreonam 10/31 -->  Code Status: Full  Family Communication: Pt at bedside  Disposition Plan: SNF when medically ready, Psych consultation required prior to discharge to SNF   HPI/Subjective: No events overnight.   Objective: Filed Vitals:   03/30/12 0605 03/30/12 1414 03/30/12 2205 03/31/12 0500  BP: 148/69 159/90 156/75 171/82  Pulse: 66 105 74 76  Temp: 98.6 F (37 C) 98.4 F (36.9 C) 98.5 F (36.9 C) 98.4 F (36.9 C)  TempSrc: Oral Oral Oral Oral  Resp: 20 18 20 18   Height:      Weight: 74.2 kg (163 lb 9.3 oz)   76 kg (167 lb 8.8 oz)  SpO2: 100% 100% 100% 100%    Intake/Output Summary (Last 24 hours) at 03/31/12 1239 Last data filed at 03/31/12 1058  Gross per 24 hour  Intake   1230 ml  Output   1200 ml  Net     30 ml    Exam:   General:  Pt  is alert, follows some commands appropriately, not in acute distress  Cardiovascular: Regular rate and rhythm, S1/S2, no murmurs, no rubs, no gallops  Respiratory: Clear to auscultation bilaterally, no wheezing, no crackles, no rhonchi  Abdomen: Soft, non tender, non distended, bowel sounds present, no guarding  Extremities: No edema, Left AKA  Neuro: Grossly nonfocal  Data Reviewed: Basic Metabolic Panel:  Lab 03/31/12 1610 03/30/12 0517 03/29/12 0454 03/28/12 1250 03/28/12 0500  NA 143 144 139 135 134*  K 3.0* 3.3* 3.8 5.4* 6.0*  CL 113* 113* 110 111 110  CO2 20 21 19  15* 14*  GLUCOSE 107* 150* 184* 196* 146*  BUN 34* 38* 42* 50*  57*  CREATININE 1.08 1.08 1.14* 1.50* 2.10*  CALCIUM 7.8* 8.1* 7.9* 7.2* 6.5*  MG 1.1* 1.4* -- -- 0.7*  PHOS -- 3.7 -- -- 4.7*   Liver Function Tests:  Lab 03/27/12 1545  AST 24  ALT 20  ALKPHOS 58  BILITOT 0.3  PROT 7.8  ALBUMIN 2.6*    Lab 03/27/12 1545  LIPASE 107*  AMYLASE --    Lab 03/28/12 1250  AMMONIA 34   CBC:  Lab 03/31/12 0438 03/30/12 0517 03/29/12 0454 03/28/12 0500 03/27/12 1545  WBC 5.7 5.5 4.9 6.4 16.5*  NEUTROABS -- -- 4.1 -- --  HGB 7.4* 7.8* 8.3* 8.9* 11.4*  HCT 22.2* 23.2* 24.3* 27.0* 33.8*  MCV 85.7 84.1 82.9 85.7 84.1  PLT 264 283 269 250 372   Cardiac Enzymes:  Lab 03/27/12 2250  CKTOTAL --  CKMB --  CKMBINDEX --  TROPONINI <0.30   Recent Results (from the past 240 hour(s))  CULTURE, BLOOD (ROUTINE X 2)     Status: Normal (Preliminary result)   Collection Time   03/27/12  3:45 PM      Component Value Range Status Comment   Specimen Description BLOOD RIGHT ARM   Final    Special Requests BOTTLES DRAWN AEROBIC AND ANAEROBIC 5CC EACH   Final    Culture  Setup Time 03/28/2012 00:21   Final    Culture     Final    Value:        BLOOD CULTURE RECEIVED NO GROWTH TO DATE CULTURE WILL BE HELD FOR 5 DAYS BEFORE ISSUING A FINAL NEGATIVE REPORT   Report Status PENDING   Incomplete   CULTURE, BLOOD (ROUTINE X 2)     Status: Normal (Preliminary result)   Collection Time   03/27/12  4:20 PM      Component Value Range Status Comment   Specimen Description BLOOD LEFT ARM   Final    Special Requests BOTTLES DRAWN AEROBIC AND ANAEROBIC 3CC EACH   Final    Culture  Setup Time 03/28/2012 00:21   Final    Culture     Final    Value:        BLOOD CULTURE RECEIVED NO GROWTH TO DATE CULTURE WILL BE HELD FOR 5 DAYS BEFORE ISSUING A FINAL NEGATIVE REPORT   Report Status PENDING   Incomplete   URINE CULTURE     Status: Normal   Collection Time   03/27/12  5:58 PM      Component Value Range Status Comment   Specimen Description URINE, RANDOM   Final     Special Requests NONE   Final    Culture  Setup Time 03/28/2012 01:38   Final    Colony Count >=100,000 COLONIES/ML   Final    Culture KLEBSIELLA PNEUMONIAE   Final  Report Status 03/29/2012 FINAL   Final    Organism ID, Bacteria KLEBSIELLA PNEUMONIAE   Final   MRSA PCR SCREENING     Status: Abnormal   Collection Time   03/27/12  9:39 PM      Component Value Range Status Comment   MRSA by PCR POSITIVE (*) NEGATIVE Final      Scheduled Meds:   . aspirin EC  162 mg Oral BID  . aztreonam  1 g Intravenous Q8H  . FLUoxetine  20 mg Oral Daily  . heparin subcutaneous  5,000 Units Subcutaneous Q8H  . hydrocortisone s  50 mg Intravenous Q12H  . magnesium IVPB  2 g Intravenous Once  . pantoprazole  40 mg Oral Daily  .  potassium chloride  40 mEq Oral Once     Debbora Presto, MD  [facility] Pager [phone]  If 7PM-7AM, please contact night-coverage [email or web address] Password [email or web address] 03/31/2012, 12:39 PM   LOS: 4 days

## 2012-03-31 NOTE — Consult Note (Signed)
Patient Identification:  [name] Date of Evaluation:  03/31/2012 Reason for Consult:  Referring Provider:   History of Present Illness  Past Psychiatric History:   Past Medical History:     Past Medical History  Diagnosis Date  . Hypertension   . Colon polyps   . Stomach problems   . Hypokalemic alkalosis   . Allergic rhinitis   . GERD (gastroesophageal reflux disease)   . Stroke 2010    no residual problems  . Shortness of breath     due to anemia  . Anemia, iron deficiency 05/03/2011    recieves iron infusions  . Anemia   . GI bleeding   . Diverticulitis   . Transfusion history   . Difficulty sleeping     takes trazadone for sleep  . Peripheral vascular disease        Past Surgical History  Procedure Date  . Leg amputation above knee   . Esophagogastroduodenoscopy 05/05/2011    Procedure: ESOPHAGOGASTRODUODENOSCOPY (EGD);  Surgeon: Erick Blinks, MD;  Location: [facility];  Service: Gastroenterology;  Laterality: N/A;  Dr. Rhea Belton will do procedure for Dr. [name] Saturday.  . Esophagogastroduodenoscopy 05/08/2011    Procedure: ESOPHAGOGASTRODUODENOSCOPY (EGD);  Surgeon: Theda Belfast;  Location: [facility];  Service: Endoscopy;  Laterality: N/A;  . Esophagogastroduodenoscopy 06/07/2011    Procedure: ESOPHAGOGASTRODUODENOSCOPY (EGD);  Surgeon: Theda Belfast, MD;  Location: [facility];  Service: Endoscopy;  Laterality: N/A;  . Hot hemostasis 06/07/2011    Procedure: HOT HEMOSTASIS (ARGON PLASMA COAGULATION/BICAP);  Surgeon: Theda Belfast, MD;  Location: [facility];  Service: Endoscopy;  Laterality: N/A;  . Esophagogastroduodenoscopy 12/20/2011    Procedure: ESOPHAGOGASTRODUODENOSCOPY (EGD);  Surgeon: Theda Belfast, MD;  Location: [facility];  Service: Endoscopy;  Laterality: N/A;  . Flexible sigmoidoscopy 12/21/2011    Procedure: FLEXIBLE SIGMOIDOSCOPY;  Surgeon: Theda Belfast, MD;  Location: [facility];  Service: Endoscopy;  Laterality: N/A;  . Carpal  tunnel release     rt hand  . Partial colectomy 02/15/2012    Procedure: PARTIAL COLECTOMY;  Surgeon: Shelly Rubenstein, MD;  Location: [facility];  Service: General;  Laterality: N/A;  . Laparotomy 02/16/2012    Procedure: EXPLORATORY LAPAROTOMY;  Surgeon: Mariella Saa, MD;  Location: [facility];  Service: General;  Laterality: N/A;  oversewing of anastomotic leak and rigid sigmoidoscopy    Allergies:  Allergies  Allergen Reactions  . Ciprofloxacin Hives and Swelling  . Morphine And Related Other (See Comments)    Makes pt sad and paranoid  . Penicillins Hives and Swelling  . Codeine Hives and Swelling    Current Medications:  Prior to Admission medications   Medication Sig Start Date End Date Taking? Authorizing Provider  atenolol (TENORMIN) 50 MG tablet Take 50 mg by mouth every morning.    Yes Historical Provider, MD  cetirizine (ZYRTEC) 10 MG tablet Take 10 mg by mouth daily.    Yes Historical Provider, MD  esomeprazole (NEXIUM) 40 MG capsule Take 40 mg by mouth daily before breakfast.   Yes Historical Provider, MD  FLUoxetine (PROZAC) 20 MG capsule Take 20 mg by mouth every morning.    Yes Historical Provider, MD  gabapentin (NEURONTIN) 300 MG capsule Take 600 mg by mouth 3 (three) times daily.    Yes Historical Provider, MD  hydrochlorothiazide (HYDRODIURIL) 12.5 MG tablet Take 12.5 mg by mouth every morning.    Yes Historical Provider, MD  lisinopril (PRINIVIL,ZESTRIL) 40 MG tablet Take 40 mg by mouth  every morning.    Yes Historical Provider, MD  potassium chloride SA (K-DUR,KLOR-CON) 20 MEQ tablet Take 20 mEq by mouth daily.   Yes Historical Provider, MD  traMADol (ULTRAM) 50 MG tablet Take 100 mg by mouth every 12 (twelve) hours as needed. For pain.   Yes Historical Provider, MD  traZODone (DESYREL) 100 MG tablet Take 100 mg by mouth at bedtime.    Yes Historical Provider, MD  aspirin EC 81 MG tablet Take 162 mg by mouth 2 (two) times daily.    Historical Provider, MD    vitamin C (ASCORBIC ACID) 500 MG tablet Take 500 mg by mouth daily.    Historical Provider, MD    Social History:    reports that she has been smoking Cigarettes.  She has never used smokeless tobacco. She reports that she drinks alcohol. She reports that she uses illicit drugs (Marijuana).   Family History:    History reviewed. No pertinent family history.  Mental Status Examination/Evaluation    DIAGNOSIS:   AXIS I      AXIS II  Deffered  AXIS III See medical notes.  AXIS IV   AXIS V    Assessment/Plan: Patient is seen briefly and will continue discussion whether and 513  RECOMMENDATION: 1.  see patient in a.m. Juliza Machnik J. Ferol Luz, MD Psychiatrist  03/31/2012   1:26 PM

## 2012-03-31 NOTE — Clinical Social Work Note (Signed)
CSW consult on Friday. See CSW note dated 03/28/12. Pt admitted by family due to IVC and Pt refusing medical care. CSW spoke at length with family on Friday over the phone. Pt was not agreeable to SNF on last admit.  Pt's confusion will need to clear in order for SNF placement to occur. CSW recommends psych consult to assist if Pt remains confused. CSW gave report to [facility] CSW and she will follow up.   Doreen Salvage, LCSW ICU/Stepdown Clinical Social Worker [facility] Cell [phone] Hours 8am-1200pm M-F

## 2012-03-31 NOTE — Care Management Note (Signed)
    Page 1 of 2   04/01/2012     3:15:02 PM   CARE MANAGEMENT NOTE 04/01/2012  Patient:  [name], [name]   Account Number:  [id number]  Date Initiated:  03/28/2012  Documentation initiated by:  DAVIS,RHONDA  Subjective/Objective Assessment:   patient admitted due toposs sepsis and ams     Action/Plan:   from home   Anticipated DC Date:  04/01/2012   Anticipated DC Plan:  SKILLED NURSING FACILITY  In-house referral  NA      DC Planning Services  CM consult      PAC Choice  NA   Choice offered to / List presented to:  NA   DME arranged  NA      DME agency  NA     HH arranged  NA      HH agency  NA   Status of service:  Completed, signed off Medicare Important Message given?  NA - LOS <3 / Initial given by admissions (If response is "NO", the following Medicare IM given date fields will be blank) Date Medicare IM given:   Date Additional Medicare IM given:    Discharge Disposition:  SKILLED NURSING FACILITY  Per UR Regulation:  Reviewed for med. necessity/level of care/duration of stay  If discussed at Long Length of Stay Meetings, dates discussed:    Comments:  04/01/12 Alysandra Lobue RN,BSN NCM [phone] D/C SNF.  03/31/12 Reyce Lubeck RN,BSN NCM [phone] TRANSFER FROM SDU.S/P COLECTOMY,ARF,HYYPOTENSION.IV ABX.NOTED REFUSING CARE.PSYCH CONS.OZ:HYQMVHQIO DELIRIUM.FROM SNF. [id number] Earlene Plater, RN, BSN, CCM: CHART REVIEWED AND UPDATED. NO DISCHARGE NEEDS PRESENT AT THIS TIME. CASE MANAGEMENT [phone]

## 2012-04-01 DIAGNOSIS — E860 Dehydration: Secondary | ICD-10-CM | POA: Diagnosis not present

## 2012-04-01 DIAGNOSIS — R1032 Left lower quadrant pain: Secondary | ICD-10-CM | POA: Diagnosis not present

## 2012-04-01 DIAGNOSIS — Z79899 Other long term (current) drug therapy: Secondary | ICD-10-CM | POA: Diagnosis not present

## 2012-04-01 DIAGNOSIS — N390 Urinary tract infection, site not specified: Secondary | ICD-10-CM | POA: Diagnosis not present

## 2012-04-01 DIAGNOSIS — T8131XA Disruption of external operation (surgical) wound, not elsewhere classified, initial encounter: Secondary | ICD-10-CM | POA: Diagnosis not present

## 2012-04-01 DIAGNOSIS — K2100 Gastro-esophageal reflux disease with esophagitis, without bleeding: Secondary | ICD-10-CM | POA: Diagnosis not present

## 2012-04-01 DIAGNOSIS — I6990 Unspecified sequelae of unspecified cerebrovascular disease: Secondary | ICD-10-CM | POA: Diagnosis not present

## 2012-04-01 DIAGNOSIS — Z5189 Encounter for other specified aftercare: Secondary | ICD-10-CM | POA: Diagnosis not present

## 2012-04-01 DIAGNOSIS — D508 Other iron deficiency anemias: Secondary | ICD-10-CM | POA: Diagnosis not present

## 2012-04-01 DIAGNOSIS — M7989 Other specified soft tissue disorders: Secondary | ICD-10-CM | POA: Diagnosis not present

## 2012-04-01 DIAGNOSIS — R109 Unspecified abdominal pain: Secondary | ICD-10-CM | POA: Diagnosis not present

## 2012-04-01 DIAGNOSIS — E876 Hypokalemia: Secondary | ICD-10-CM | POA: Diagnosis not present

## 2012-04-01 DIAGNOSIS — K922 Gastrointestinal hemorrhage, unspecified: Secondary | ICD-10-CM | POA: Diagnosis not present

## 2012-04-01 DIAGNOSIS — K5730 Diverticulosis of large intestine without perforation or abscess without bleeding: Secondary | ICD-10-CM | POA: Diagnosis not present

## 2012-04-01 DIAGNOSIS — A4159 Other Gram-negative sepsis: Secondary | ICD-10-CM | POA: Diagnosis not present

## 2012-04-01 DIAGNOSIS — IMO0001 Reserved for inherently not codable concepts without codable children: Secondary | ICD-10-CM | POA: Diagnosis not present

## 2012-04-01 DIAGNOSIS — E875 Hyperkalemia: Secondary | ICD-10-CM | POA: Diagnosis not present

## 2012-04-01 DIAGNOSIS — G9340 Encephalopathy, unspecified: Secondary | ICD-10-CM | POA: Diagnosis not present

## 2012-04-01 DIAGNOSIS — Z1231 Encounter for screening mammogram for malignant neoplasm of breast: Secondary | ICD-10-CM | POA: Diagnosis not present

## 2012-04-01 DIAGNOSIS — I739 Peripheral vascular disease, unspecified: Secondary | ICD-10-CM | POA: Diagnosis not present

## 2012-04-01 DIAGNOSIS — E871 Hypo-osmolality and hyponatremia: Secondary | ICD-10-CM | POA: Diagnosis not present

## 2012-04-01 DIAGNOSIS — R4182 Altered mental status, unspecified: Secondary | ICD-10-CM | POA: Diagnosis not present

## 2012-04-01 DIAGNOSIS — I119 Hypertensive heart disease without heart failure: Secondary | ICD-10-CM | POA: Diagnosis not present

## 2012-04-01 DIAGNOSIS — S78119A Complete traumatic amputation at level between unspecified hip and knee, initial encounter: Secondary | ICD-10-CM | POA: Diagnosis not present

## 2012-04-01 DIAGNOSIS — Z7982 Long term (current) use of aspirin: Secondary | ICD-10-CM | POA: Diagnosis not present

## 2012-04-01 DIAGNOSIS — K219 Gastro-esophageal reflux disease without esophagitis: Secondary | ICD-10-CM | POA: Diagnosis not present

## 2012-04-01 DIAGNOSIS — G4700 Insomnia, unspecified: Secondary | ICD-10-CM | POA: Diagnosis not present

## 2012-04-01 DIAGNOSIS — J309 Allergic rhinitis, unspecified: Secondary | ICD-10-CM | POA: Diagnosis not present

## 2012-04-01 DIAGNOSIS — F329 Major depressive disorder, single episode, unspecified: Secondary | ICD-10-CM

## 2012-04-01 DIAGNOSIS — G479 Sleep disorder, unspecified: Secondary | ICD-10-CM | POA: Diagnosis not present

## 2012-04-01 DIAGNOSIS — I10 Essential (primary) hypertension: Secondary | ICD-10-CM | POA: Diagnosis not present

## 2012-04-01 DIAGNOSIS — M6281 Muscle weakness (generalized): Secondary | ICD-10-CM | POA: Diagnosis not present

## 2012-04-01 DIAGNOSIS — S31109A Unspecified open wound of abdominal wall, unspecified quadrant without penetration into peritoneal cavity, initial encounter: Secondary | ICD-10-CM | POA: Diagnosis not present

## 2012-04-01 DIAGNOSIS — R5381 Other malaise: Secondary | ICD-10-CM | POA: Diagnosis not present

## 2012-04-01 DIAGNOSIS — N170 Acute kidney failure with tubular necrosis: Secondary | ICD-10-CM | POA: Diagnosis not present

## 2012-04-01 DIAGNOSIS — Z8673 Personal history of transient ischemic attack (TIA), and cerebral infarction without residual deficits: Secondary | ICD-10-CM | POA: Diagnosis not present

## 2012-04-01 DIAGNOSIS — F3289 Other specified depressive episodes: Secondary | ICD-10-CM

## 2012-04-01 DIAGNOSIS — Z87440 Personal history of urinary (tract) infections: Secondary | ICD-10-CM | POA: Diagnosis not present

## 2012-04-01 DIAGNOSIS — Z87891 Personal history of nicotine dependence: Secondary | ICD-10-CM | POA: Diagnosis not present

## 2012-04-01 DIAGNOSIS — K5732 Diverticulitis of large intestine without perforation or abscess without bleeding: Secondary | ICD-10-CM | POA: Diagnosis not present

## 2012-04-01 DIAGNOSIS — T819XXA Unspecified complication of procedure, initial encounter: Secondary | ICD-10-CM | POA: Diagnosis not present

## 2012-04-01 DIAGNOSIS — D649 Anemia, unspecified: Secondary | ICD-10-CM | POA: Diagnosis not present

## 2012-04-01 DIAGNOSIS — G459 Transient cerebral ischemic attack, unspecified: Secondary | ICD-10-CM | POA: Diagnosis not present

## 2012-04-01 DIAGNOSIS — E2749 Other adrenocortical insufficiency: Secondary | ICD-10-CM | POA: Diagnosis not present

## 2012-04-01 DIAGNOSIS — N178 Other acute kidney failure: Secondary | ICD-10-CM | POA: Diagnosis not present

## 2012-04-01 LAB — CBC
Hemoglobin: 11.3 g/dL — ABNORMAL LOW (ref 12.0–15.0)
MCH: 27.8 pg (ref 26.0–34.0)
MCHC: 32.9 g/dL (ref 30.0–36.0)
RDW: 15.3 % (ref 11.5–15.5)

## 2012-04-01 LAB — TYPE AND SCREEN
Unit division: 0
Unit division: 0

## 2012-04-01 LAB — URINE CULTURE: Colony Count: >=100000

## 2012-04-01 LAB — BASIC METABOLIC PANEL
BUN: 20 mg/dL (ref 6–23)
Calcium: 8 mg/dL — ABNORMAL LOW (ref 8.4–10.5)
GFR calc Af Amer: >90 mL/min (ref >90)
GFR calc non Af Amer: >90 mL/min (ref >90)
Glucose, Bld: 149 mg/dL — ABNORMAL HIGH (ref 70–99)
Potassium: 3.8 mEq/L (ref 3.5–5.1)
Sodium: 141 mEq/L (ref 135–145)

## 2012-04-01 MED ORDER — ASPIRIN EC 81 MG PO TBEC: 162.0000 mg | DELAYED_RELEASE_TABLET | Freq: Two times a day (BID) | ORAL | Status: DC

## 2012-04-01 MED ORDER — POTASSIUM CHLORIDE CRYS ER 20 MEQ PO TBCR: 20.0000 meq | EXTENDED_RELEASE_TABLET | Freq: Every day | ORAL | Status: DC

## 2012-04-01 MED ORDER — TRAZODONE HCL 100 MG PO TABS: 100.0000 mg | ORAL_TABLET | Freq: Every day | ORAL | Status: DC

## 2012-04-01 MED ORDER — FLUOXETINE HCL 20 MG PO CAPS: 20.0000 mg | ORAL_CAPSULE | Freq: Every morning | ORAL | Status: DC

## 2012-04-01 MED ORDER — TRAMADOL HCL 50 MG PO TABS: 100.0000 mg | ORAL_TABLET | Freq: Two times a day (BID) | ORAL | Status: DC | PRN

## 2012-04-01 MED ORDER — CETIRIZINE HCL 10 MG PO TABS: 10.0000 mg | ORAL_TABLET | Freq: Every day | ORAL | Status: DC

## 2012-04-01 MED ORDER — LISINOPRIL 40 MG PO TABS: 40.0000 mg | ORAL_TABLET | Freq: Every morning | ORAL | Status: DC

## 2012-04-01 MED ORDER — ENSURE COMPLETE PO LIQD: 237.0000 mL | Freq: Every day | ORAL | Status: DC

## 2012-04-01 MED ORDER — GABAPENTIN 300 MG PO CAPS: 600.0000 mg | ORAL_CAPSULE | Freq: Three times a day (TID) | ORAL | Status: DC

## 2012-04-01 MED ORDER — ESOMEPRAZOLE MAGNESIUM 40 MG PO CPDR: 40.0000 mg | DELAYED_RELEASE_CAPSULE | Freq: Every day | ORAL | Status: DC

## 2012-04-01 MED ORDER — ATENOLOL 50 MG PO TABS: 50.0000 mg | ORAL_TABLET | Freq: Every morning | ORAL | Status: DC

## 2012-04-01 MED ORDER — PREDNISONE 50 MG PO TABS: ORAL_TABLET | ORAL | Status: DC

## 2012-04-01 MED ORDER — HYDROCHLOROTHIAZIDE 12.5 MG PO TABS: 12.5000 mg | ORAL_TABLET | Freq: Every morning | ORAL | Status: DC

## 2012-04-01 NOTE — Progress Notes (Addendum)
Patient for snf today. Spoke with patient's niece. Requesting [facility] if possible. Patient faxed out. Will need wound vac. [facility] unsure if they can accommodate.  Margret Moat C. Aviel Davalos MSW, LCSW [phone] [facility] unable to accept.  Danicia Terhaar C. Lucille Crichlow MSW, [place] [phone] [facility] Ginette Otto and family agreeable to same. Patient came with own wound vac. Pack copied and placed in [facility].  Jamekia Gannett C. Donicia Druck MSW, LCSW [phone]

## 2012-04-01 NOTE — Discharge Summary (Signed)
Physician Discharge Summary  [name]:[id number] DOB: Sep 16, 1950 DOA: 03/27/2012  PCP: Patricia Oats, MD  Admit date: 03/27/2012 Discharge date: 04/01/2012  Recommendations for Outpatient Follow-up:  1. Pt will need to follow up with PCP in 2-3 weeks post discharge 2. Please obtain BMP to evaluate electrolytes and kidney function 3. Please also check magnesium level and supplement if indicated 4. Please also check CBC to evaluate Hg and Hct levels 5. Please note that patient was sent to SNF on prednisone taper pack 6. In addition please note that patient has completed therapy for urinary tract infection so no antibiotics needed upon discharge 7. Patient was discharged to skilled nursing facility with wound VAC in place and will need to followup with surgery in 2 weeks post discharge [facility] surgery) 8. Of note, patient has received 2 units of PRBCs transfusion during the hospital stay  Discharge Diagnoses:  Principal Problem:  *Acute encephalopathy Active Problems:  Renal failure, acute  Acute hyperkalemia  UTI (lower urinary tract infection)  DEPRESSION, ACUTE  Anemia, iron deficiency  Hyponatremia  Dehydration   Discharge Condition: Stable  Diet recommendation: Heart healthy diet discussed in details   Brief narrative:  Pt is 61 yo female former smoker with HTN, GERD, CVA, Anemia, PVD s/p left AKA transferred from NH on 03/27/2012 with hypotension, acute renal failure, and hypokalemia with concern for adrenal insufficiency. Had left colectomy 02/15/12 with open wound and wound vac. Surgery also involved and on 11/01 VAC was removed; wound was reported by surgery team to be clean with 100% granulation tissue and measured approximately 14 cm long by 3 cm wide by 3 cm deep; VAC reapplied at that time as well.   Active Problems:  Principal Problem:  *Acute encephalopathy  - likely multifactorial and secondary to adrenal insufficiency, progressive failure to  thrive and deconditioning, acute UTI  - appeared to be resolving, improved since yesterday in terms of mental status  - will continue Fluoxetine  Active Problems:  Adrenal insufficiency  - changed Hydrocortisone to Prednisone  - Please see instructions for prednisone taper pack below Renal failure, acute  - secondary to dehydration  - IV fluids provided and patient has responded well - creatinine is now within normal limits  UTI  - Klebsiella noted er urine cultures  - Continued Aztreonam and the therapy has been completed Acute hyperkalemia  - this was noted on blood work upon admission  - now resolved  DEPRESSION, ACUTE  - appears more depressed this AM  - continue Fluoxetine  Anemia, iron deficiency  - chronic but Hg did drop since yesterday  - transfused 2 units PRBC  - CBC in AM  Hyponatremia  - secondary to pre renal etiology in the setting of dehydration  - now resolved  Dehydration  - in the setting of adrenal insufficiency  - resolved  - d/c IVF  Hypomagnesemia  - supplemented   Consultants:  PCCM initially admitted under their service and pt transferred to [facility] for further management 03/30/2012  Surgery  Procedures/Studies:  CT head 10/31 --> chronic atrophy  Chest XRAY 10/31 --> no acute cardiopulmonary events  Antibiotics:  Aztreonam 10/31 --> 11/05  Discharge Exam:  Filed Vitals:   04/01/12 0501  BP: 157/56  Pulse: 101  Temp: 98.6 F (37 C)  Resp: 20   Filed Vitals:   04/01/12 0150 04/01/12 0250 04/01/12 0501 04/01/12 0533  BP: 173/83 159/81 157/56   Pulse: 65 66 101   Temp: 98.4 F (36.9 C)  98 F (36.7 C) 98.6 F (37 C)   TempSrc: Oral Oral Oral   Resp: 18 18 20    Height:      Weight:    74.753 kg (164 lb 12.8 oz)  SpO2:   100%     General: Pt is alert, follows commands appropriately, not in acute distress Cardiovascular: Regular rate and rhythm, S1/S2 +, no murmurs, no rubs, no gallops Respiratory: Clear to auscultation bilaterally,  no wheezing, no crackles, no rhonchi Abdominal: Soft, non tender, non distended, bowel sounds +, no guarding, wound vac in place Neuro: Grossly nonfocal  Discharge Instructions  Discharge Orders    Future Appointments: Provider: Department: Dept Phone: Center:   04/14/2012 10:50 AM Patricia Rubenstein, MD [facility] [phone] None   04/15/2012 9:15 AM [facility] [name] MAMMOGRAPHY [phone]     Future Orders Please Complete By Expires   Diet - low sodium heart healthy      Increase activity slowly          Medication List     As of 04/01/2012 12:19 PM    TAKE these medications         aspirin EC 81 MG tablet   Take 2 tablets (162 mg total) by mouth 2 (two) times daily.      atenolol 50 MG tablet   Commonly known as: TENORMIN   Take 1 tablet (50 mg total) by mouth every morning.      cetirizine 10 MG tablet   Commonly known as: ZYRTEC   Take 1 tablet (10 mg total) by mouth daily.      esomeprazole 40 MG capsule   Commonly known as: NEXIUM   Take 1 capsule (40 mg total) by mouth daily before breakfast.      feeding supplement Liqd   Take 237 mLs by mouth daily at 12 noon.      FLUoxetine 20 MG capsule   Commonly known as: PROZAC   Take 1 capsule (20 mg total) by mouth every morning.      gabapentin 300 MG capsule   Commonly known as: NEURONTIN   Take 2 capsules (600 mg total) by mouth 3 (three) times daily.      hydrochlorothiazide 12.5 MG tablet   Commonly known as: HYDRODIURIL   Take 1 tablet (12.5 mg total) by mouth every morning.      lisinopril 40 MG tablet   Commonly known as: PRINIVIL,ZESTRIL   Take 1 tablet (40 mg total) by mouth every morning.      potassium chloride SA 20 MEQ tablet   Commonly known as: K-DUR,KLOR-CON   Take 1 tablet (20 mEq total) by mouth daily.      predniSONE 50 MG tablet   Commonly known as: DELTASONE   Take 50 mg tablet today, take 40 mg tablet 11/06, take 30 mg tablet  11/07, take 20 mg tablet 11/08, take 10 mg tab 11/09, and then stop      traMADol 50 MG tablet   Commonly known as: ULTRAM   Take 2 tablets (100 mg total) by mouth every 12 (twelve) hours as needed. For pain.      traZODone 100 MG tablet   Commonly known as: DESYREL   Take 1 tablet (100 mg total) by mouth at bedtime.      vitamin C 500 MG tablet   Commonly known as: ASCORBIC ACID   Take 500 mg by mouth daily.  Follow-up Information    Follow up with DEFAULT,PROVIDER, MD. In 1 week.   Contact information:   [address] [place] [address] [phone]           The results of significant diagnostics from this hospitalization (including imaging, microbiology, ancillary and laboratory) are listed below for reference.     Microbiology: Recent Results (from the past 240 hour(s))  CULTURE, BLOOD (ROUTINE X 2)     Status: Normal (Preliminary result)   Collection Time   03/27/12  3:45 PM      Component Value Range Status Comment   Specimen Description BLOOD RIGHT ARM   Final    Special Requests BOTTLES DRAWN AEROBIC AND ANAEROBIC 5CC EACH   Final    Culture  Setup Time 03/28/2012 00:21   Final    Culture     Final    Value:        BLOOD CULTURE RECEIVED NO GROWTH TO DATE CULTURE WILL BE HELD FOR 5 DAYS BEFORE ISSUING A FINAL NEGATIVE REPORT   Report Status PENDING   Incomplete   CULTURE, BLOOD (ROUTINE X 2)     Status: Normal (Preliminary result)   Collection Time   03/27/12  4:20 PM      Component Value Range Status Comment   Specimen Description BLOOD LEFT ARM   Final    Special Requests BOTTLES DRAWN AEROBIC AND ANAEROBIC 3CC EACH   Final    Culture  Setup Time 03/28/2012 00:21   Final    Culture     Final    Value:        BLOOD CULTURE RECEIVED NO GROWTH TO DATE CULTURE WILL BE HELD FOR 5 DAYS BEFORE ISSUING A FINAL NEGATIVE REPORT   Report Status PENDING   Incomplete   URINE CULTURE     Status: Normal   Collection Time   03/27/12  5:58 PM       Component Value Range Status Comment   Specimen Description URINE, RANDOM   Final    Special Requests NONE   Final    Culture  Setup Time 03/28/2012 01:38   Final    Colony Count >=100,000 COLONIES/ML   Final    Culture KLEBSIELLA PNEUMONIAE   Final    Report Status 04/01/2012 FINAL   Final    Organism ID, Bacteria KLEBSIELLA PNEUMONIAE   Final   MRSA PCR SCREENING     Status: Abnormal   Collection Time   03/27/12  9:39 PM      Component Value Range Status Comment   MRSA by PCR POSITIVE (*) NEGATIVE Final      Labs: Basic Metabolic Panel:  Lab 04/01/12 9147 03/31/12 0438 03/30/12 0517 03/29/12 0454 03/28/12 1250 03/28/12 0500  NA 141 143 144 139 135 --  K 3.8 3.0* 3.3* 3.8 5.4* --  CL 110 113* 113* 110 111 --  CO2 21 20 21 19  15* --  GLUCOSE 149* 107* 150* 184* 196* --  BUN 20 34* 38* 42* 50* --  CREATININE 0.77 1.08 1.08 1.14* 1.50* --  CALCIUM 8.0* 7.8* 8.1* 7.9* 7.2* --  MG 1.9 1.1* 1.4* -- -- 0.7*  PHOS -- -- 3.7 -- -- 4.7*   Liver Function Tests:  Lab 03/27/12 1545  AST 24  ALT 20  ALKPHOS 58  BILITOT 0.3  PROT 7.8  ALBUMIN 2.6*    Lab 03/27/12 1545  LIPASE 107*  AMYLASE --    Lab 03/28/12 1250  AMMONIA 34  CBC:  Lab 04/01/12 0450 03/31/12 0438 03/30/12 0517 03/29/12 0454 03/28/12 0500  WBC 7.8 5.7 5.5 4.9 6.4  NEUTROABS -- -- -- 4.1 --  HGB 11.3* 7.4* 7.8* 8.3* 8.9*  HCT 34.3* 22.2* 23.2* 24.3* 27.0*  MCV 84.5 85.7 84.1 82.9 85.7  PLT 221 264 283 269 250   Cardiac Enzymes:  Lab 03/27/12 2250  CKTOTAL --  CKMB --  CKMBINDEX --  TROPONINI <0.30     SIGNED: Time coordinating discharge: Over 30 minutes  Debbora Presto, MD  [facility] 04/01/2012, 12:19 PM Pager [phone]  If 7PM-7AM, please contact night-coverage [email or web address] Password [email or web address]

## 2012-04-01 NOTE — Progress Notes (Signed)
Physical Therapy Treatment Patient Details Name: [name] MRN: [id number] DOB: 1950-09-18 Today's Date: 04/01/2012 Time: 4098-1191 PT Time Calculation (min): 21 min  PT Assessment / Plan / Recommendation Comments on Treatment Session  Pts cognition seems improved today compared to Sundays visit from PT.  Continues to be somewhat confused, but with intermittent moments of lucidity.  Agreed to get up and take a few steps, however demos anxious behavior and wishes to return to chair.        Follow Up Recommendations  Post acute inpatient;Supervision/Assistance - 24 hour     Does the patient have the potential to tolerate intense rehabilitation  No, Recommend SNF  Barriers to Discharge        Equipment Recommendations  None recommended by PT    Recommendations for Other Services    Frequency Min 3X/week   Plan Discharge plan remains appropriate    Precautions / Restrictions Precautions Precautions: Fall Precaution Comments: Pt has L AKA Restrictions Weight Bearing Restrictions: No LLE Weight Bearing:  (BKA left)   Pertinent Vitals/Pain Pt states that she is having some pain in abdomen, however does not state a number.     Mobility  Bed Mobility Bed Mobility: Supine to Sit Details for Bed Mobility Assistance: Pt able to maneuver RLE and residual limb, however requires some assist for trunk to get to EOB.  cues for hand placement on bed rather than grabbing for therapist.  Transfers Transfers: Sit to Stand;Stand to Sit Sit to Stand: 4: Min assist Stand to Sit: 4: Min assist Details for Transfer Assistance: Somewhat extra time for scooting to EOB, however only requires some assist to rise and steady with cues for hand placement and safety.  Ambulation/Gait Ambulation/Gait Assistance: 1: +2 Total assist Ambulation/Gait: Patient Percentage: 60% Ambulation Distance (Feet): 4 Feet Assistive device: Rolling walker Ambulation/Gait Assistance Details: Pt able to take a few  hops using RW, however noted increased anxiety and states that she is scared to keep going.   Gait Pattern: Step-to pattern    Exercises General Exercises - Lower Extremity Long Arc Quad: Strengthening;Right;15 reps;Seated Hip ABduction/ADduction: Strengthening;10 reps;Both;Seated (Also performed ADD w/ pillow squeeze x 10 reps)   PT Diagnosis:    PT Problem List:   PT Treatment Interventions:     PT Goals Acute Rehab PT Goals PT Goal Formulation: Patient unable to participate in goal setting Time For Goal Achievement: 04/13/12 Potential to Achieve Goals: Good Pt will go Supine/Side to Sit: with supervision PT Goal: Supine/Side to Sit - Progress: Progressing toward goal Pt will Transfer Bed to Chair/Chair to Bed: with min assist PT Transfer Goal: Bed to Chair/Chair to Bed - Progress: Progressing toward goal Pt will Stand: 3 - 5 min;with supervision PT Goal: Stand - Progress: Progressing toward goal  Visit Information  Last PT Received On: 04/01/12 Assistance Needed: +2    Subjective Data  Subjective: I'm scared Patient Stated Goal: none stated   Cognition  Overall Cognitive Status: Impaired Area of Impairment: Safety/judgement;Memory;Following commands Arousal/Alertness: Awake/alert Orientation Level: Disoriented to;Place;Situation Behavior During Session: [facility] for tasks performed Memory: Decreased recall of precautions Memory Deficits: Pt somewhat slow to process commands and attend to task.  Following Commands: Follows one step commands with increased time Safety/Judgement: Decreased awareness of safety precautions Problem Solving: Pt seems to be more cognitively intact during todays session.  Agreed to try and take some steps, however was more anxious about trying to go far.     Balance     End  of Session PT - End of Session Equipment Utilized During Treatment: Gait belt Activity Tolerance: Patient limited by fatigue Patient left: in chair;with call bell/phone within  reach;with chair alarm set;with nursing in room Nurse Communication: Mobility status   GP     [name], [name] 04/01/2012, 9:08 AM

## 2012-04-01 NOTE — Progress Notes (Signed)
ANTIBIOTIC CONSULT NOTE - FOLLOW UP  Pharmacy Consult for Aztreonam Indication: UTI  Allergies  Allergen Reactions  . Ciprofloxacin Hives and Swelling  . Morphine And Related Other (See Comments)    Makes pt sad and paranoid  . Penicillins Hives and Swelling  . Codeine Hives and Swelling   Patient Measurements: Height: 5\' 3"  (160 cm) Weight: 164 lb 12.8 oz (74.753 kg) IBW/kg (Calculated) : 52.4   Vital Signs: Temp: 98.6 F (37 C) (11/05 0501) Temp src: Oral (11/05 0501) BP: 157/56 mmHg (11/05 0501) Pulse Rate: 101  (11/05 0501)  Labs:  Basename 04/01/12 0450 03/31/12 0438 03/30/12 0517  WBC 7.8 5.7 5.5  HGB 11.3* 7.4* 7.8*  PLT 221 264 283  LABCREA -- -- --  CREATININE 0.77 1.08 1.08   Estimated Creatinine Clearance: 73.4 ml/min (by C-G formula based on Cr of 0.77).  Microbiology: 10/31 blood x 2: NGTD 10/31 urine: > 100k Klebsiella (Sens: Cefazolin, ceftriaxone, cipro, gent, levo, zosyn) --> contacted lab 11/3 to add on testing for Aztreonam and lab has confirmed that culture is sensitive to Aztreonam 11/5. 10/31 MRSA by PCR +  Assessment:  61 YOF admit from [facility] 10/31 with altered mental status, hypotension, acute renal failure, hyperkalemia, and suspected sepsis.    Day # 6 aztreonam for Klebsiella UTI.  Renal function continues to improve.  Patient is afebrile, WBC WNL.  Goals of Therapy:  Antibiotic doses appropriate for renal function Eradication of infection  Plan:   Continue Aztreonam 1 gram IV every 8 hours.  Clance Boll, PharmD, BCPS Pager: [phone] 04/01/2012 11:03 AM

## 2012-04-01 NOTE — Progress Notes (Signed)
Patient discharge to [facility], called facility 2x, spoke to [facility] to give report but she  said they will call us back. Patients wound vac tubing  Clamped for transport to SNF, Melissa is aware. Wound vac machine and charger (w/cpt came in with ) sent with the patient belongings. Spoke with Fayrene Fearing( patients son) and made aware of the wound vac machine, instructed to p/u and return to  pts HH.since she is going to a SNF.Marland Kitchen

## 2012-04-01 NOTE — Progress Notes (Signed)
Patient ID: [name], female   DOB: [birth year], 61 y.o.   MRN: [id number]  Awake and alert Abdomen soft, non tender, no distended VAC in place  Plan:  Continue wound VAC

## 2012-04-02 DIAGNOSIS — I10 Essential (primary) hypertension: Secondary | ICD-10-CM | POA: Diagnosis not present

## 2012-04-02 DIAGNOSIS — E2749 Other adrenocortical insufficiency: Secondary | ICD-10-CM | POA: Diagnosis not present

## 2012-04-02 DIAGNOSIS — I6990 Unspecified sequelae of unspecified cerebrovascular disease: Secondary | ICD-10-CM | POA: Diagnosis not present

## 2012-04-02 DIAGNOSIS — K2100 Gastro-esophageal reflux disease with esophagitis, without bleeding: Secondary | ICD-10-CM | POA: Diagnosis not present

## 2012-04-02 DIAGNOSIS — E876 Hypokalemia: Secondary | ICD-10-CM | POA: Diagnosis not present

## 2012-04-02 DIAGNOSIS — I739 Peripheral vascular disease, unspecified: Secondary | ICD-10-CM | POA: Diagnosis not present

## 2012-04-03 LAB — CULTURE, BLOOD (ROUTINE X 2): Culture: NO GROWTH

## 2012-04-07 ENCOUNTER — Telehealth (INDEPENDENT_AMBULATORY_CARE_PROVIDER_SITE_OTHER): Payer: Self-pay | Admitting: General Surgery

## 2012-04-07 NOTE — Telephone Encounter (Signed)
Faxed back ok for refill on Tramadol #40 Per [name]. Faxed back to [facility]. Faxed number [phone] call back number [phone]

## 2012-04-09 ENCOUNTER — Emergency Department (HOSPITAL_COMMUNITY)
Admission: EM | Admit: 2012-04-09 | Discharge: 2012-04-10 | Disposition: A | Discharge status: Home / Self Care | Payer: Medicare Other | Attending: Emergency Medicine | Admitting: Emergency Medicine

## 2012-04-09 ENCOUNTER — Encounter (HOSPITAL_COMMUNITY): Payer: Self-pay | Admitting: *Deleted

## 2012-04-09 ENCOUNTER — Emergency Department (HOSPITAL_COMMUNITY): Payer: Medicare Other

## 2012-04-09 DIAGNOSIS — I739 Peripheral vascular disease, unspecified: Secondary | ICD-10-CM | POA: Insufficient documentation

## 2012-04-09 DIAGNOSIS — Z7982 Long term (current) use of aspirin: Secondary | ICD-10-CM | POA: Insufficient documentation

## 2012-04-09 DIAGNOSIS — Z79899 Other long term (current) drug therapy: Secondary | ICD-10-CM | POA: Insufficient documentation

## 2012-04-09 DIAGNOSIS — I10 Essential (primary) hypertension: Secondary | ICD-10-CM | POA: Insufficient documentation

## 2012-04-09 DIAGNOSIS — S31109A Unspecified open wound of abdominal wall, unspecified quadrant without penetration into peritoneal cavity, initial encounter: Secondary | ICD-10-CM | POA: Diagnosis not present

## 2012-04-09 DIAGNOSIS — D649 Anemia, unspecified: Secondary | ICD-10-CM | POA: Insufficient documentation

## 2012-04-09 DIAGNOSIS — J309 Allergic rhinitis, unspecified: Secondary | ICD-10-CM | POA: Insufficient documentation

## 2012-04-09 DIAGNOSIS — K5732 Diverticulitis of large intestine without perforation or abscess without bleeding: Secondary | ICD-10-CM | POA: Insufficient documentation

## 2012-04-09 DIAGNOSIS — G479 Sleep disorder, unspecified: Secondary | ICD-10-CM | POA: Insufficient documentation

## 2012-04-09 DIAGNOSIS — Z87891 Personal history of nicotine dependence: Secondary | ICD-10-CM | POA: Insufficient documentation

## 2012-04-09 DIAGNOSIS — K922 Gastrointestinal hemorrhage, unspecified: Secondary | ICD-10-CM | POA: Insufficient documentation

## 2012-04-09 DIAGNOSIS — K219 Gastro-esophageal reflux disease without esophagitis: Secondary | ICD-10-CM | POA: Insufficient documentation

## 2012-04-09 DIAGNOSIS — Z8673 Personal history of transient ischemic attack (TIA), and cerebral infarction without residual deficits: Secondary | ICD-10-CM | POA: Insufficient documentation

## 2012-04-09 LAB — URINALYSIS, ROUTINE W REFLEX MICROSCOPIC
Ketones, ur: NEGATIVE mg/dL
Nitrite: NEGATIVE
Protein, ur: NEGATIVE mg/dL
Urobilinogen, UA: 1 mg/dL (ref 0.0–1.0)
pH: 5.5 (ref 5.0–8.0)

## 2012-04-09 LAB — COMPREHENSIVE METABOLIC PANEL
ALT: 40 U/L — ABNORMAL HIGH (ref 0–35)
CO2: 28 mEq/L (ref 19–32)
Calcium: 5.9 mg/dL — CL (ref 8.4–10.5)
Creatinine, Ser: 0.66 mg/dL (ref 0.50–1.10)
GFR calc Af Amer: >90 mL/min (ref >90)
GFR calc non Af Amer: >90 mL/min (ref >90)
Glucose, Bld: 90 mg/dL (ref 70–99)
Total Bilirubin: 0.3 mg/dL (ref 0.3–1.2)

## 2012-04-09 LAB — CBC WITH DIFFERENTIAL/PLATELET
Basophils Absolute: 0 10*3/uL (ref 0.0–0.1)
Basophils Relative: 0 % (ref 0–1)
Eosinophils Absolute: 0.2 10*3/uL (ref 0.0–0.7)
Eosinophils Relative: 2 % (ref 0–5)
Lymphocytes Relative: 28 % (ref 12–46)
MCHC: 32.8 g/dL (ref 30.0–36.0)
MCV: 88 fL (ref 78.0–100.0)
Platelets: 166 10*3/uL (ref 150–400)
RDW: 17.1 % — ABNORMAL HIGH (ref 11.5–15.5)
WBC: 9.6 10*3/uL (ref 4.0–10.5)

## 2012-04-09 MED ORDER — SODIUM CHLORIDE 0.9 % IV SOLN
2.0000 g | Freq: Once | INTRAVENOUS | Status: AC
  Administered 2012-04-09: 2 g via INTRAVENOUS
  Filled 2012-04-09: qty 20

## 2012-04-09 MED ORDER — FENTANYL CITRATE 0.05 MG/ML IJ SOLN
50.0000 ug | Freq: Once | INTRAMUSCULAR | Status: AC
  Administered 2012-04-09: 50 ug via INTRAVENOUS
  Filled 2012-04-09: qty 2

## 2012-04-09 MED ORDER — SODIUM CHLORIDE 0.9 % IV SOLN
1.0000 g | Freq: Once | INTRAVENOUS | Status: AC
  Administered 2012-04-09: 1 g via INTRAVENOUS
  Filled 2012-04-09: qty 10

## 2012-04-09 MED ORDER — MAGNESIUM SULFATE 40 MG/ML IJ SOLN
2.0000 g | Freq: Once | INTRAMUSCULAR | Status: AC
  Administered 2012-04-09: 2 g via INTRAVENOUS
  Filled 2012-04-09: qty 50

## 2012-04-09 MED ORDER — SODIUM CHLORIDE 0.9 % IV SOLN: 1.0000 g | Freq: Once | INTRAVENOUS | Status: DC

## 2012-04-09 MED ORDER — SODIUM CHLORIDE 0.9 % IV SOLN
1.0000 g | Freq: Once | INTRAVENOUS | Status: DC
  Filled 2012-04-09: qty 10

## 2012-04-09 NOTE — ED Notes (Signed)
Pt has wound vac in place to abd wound. Vac is beeping on arrival, EMS states they did not send cord.

## 2012-04-09 NOTE — ED Notes (Signed)
Patient not able to void at this time 

## 2012-04-09 NOTE — ED Notes (Signed)
MD at bedside. 

## 2012-04-09 NOTE — ED Notes (Signed)
Critical calcium of 5.9, Magnesium of 0.5 reported to Dr. Rulon Abide

## 2012-04-09 NOTE — ED Notes (Signed)
Patient transported to X-ray 

## 2012-04-09 NOTE — ED Notes (Signed)
Pt alert and oriented on arrival to ED. Lives in assisted living [facility]"

## 2012-04-09 NOTE — ED Notes (Signed)
[facility] came from [facility] with low calcium 5.7".  Has a wound vac in place to abd from previous surgery.  No other complaints.

## 2012-04-09 NOTE — ED Provider Notes (Signed)
History     CSN: [id number]  Arrival date & time 04/09/12  1446   First MD Initiated Contact with Patient 04/09/12 1506      No chief complaint on file.   (Consider location/radiation/quality/duration/timing/severity/associated sxs/prior treatment) [name] is a 61 y.o. female with a significant surgical history partial colectomy on September 20 was had trouble with wound healing and has got a wound VAC in, surgeon is Dr. Magnus Ivan. She has been staying at [facility] for rehabilitation. She was hospitalized and released on 11/5 for altered mental status, hypotension and various electrolyte abnormalities. Patient presents today with abdominal pain is been gradually worsening since Sunday, is described as a 8/10, a "pulling" it is around the backside, and is not associated with nausea vomiting diarrhea, is been no blood in the stools, no melena, patient is having bowel movements daily after every time she eats "no problem with that." She just had her wound VAC changed today. It is changed every other day it has not increased in drainage and the character of the drainage which is been serosanguineous has not changed either. She's had no fevers, no chills, H. denies any visual changes, chest pains, shortness of breath, rash, itchy skin.  Patient does complain of some mild indigestion as well as pertinent ankle swelling which is never happened before.   She was also transported to the emergency department with a low calcium of 5.7 but is been denying any tetanic spasms. Patient's diet has not changed her appetite has not been good. 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   Past Medical History  Diagnosis Date  . Hypertension   . Colon polyps   . Stomach problems   . Hypokalemic alkalosis   . Allergic rhinitis   . GERD (gastroesophageal reflux disease)   . Stroke 2010    no residual problems    . Shortness of breath     due to anemia  . Anemia, iron deficiency 05/03/2011    recieves iron infusions  . Anemia   . GI bleeding   . Diverticulitis   . Transfusion history   . Difficulty sleeping     takes trazadone for sleep  . Peripheral vascular disease     Past Surgical History  Procedure Date  . Leg amputation above knee   . Esophagogastroduodenoscopy 05/05/2011    Procedure: ESOPHAGOGASTRODUODENOSCOPY (EGD);  Surgeon: Erick Blinks, MD;  Location: [facility];  Service: Gastroenterology;  Laterality: N/A;  Dr. Rhea Belton will do procedure for Dr. [name] Saturday.  . Esophagogastroduodenoscopy 05/08/2011    Procedure: ESOPHAGOGASTRODUODENOSCOPY (EGD);  Surgeon: Theda Belfast;  Location: [facility];  Service: Endoscopy;  Laterality: N/A;  . Esophagogastroduodenoscopy 06/07/2011    Procedure: ESOPHAGOGASTRODUODENOSCOPY (EGD);  Surgeon: Theda Belfast, MD;  Location: [facility];  Service: Endoscopy;  Laterality: N/A;  . Hot hemostasis 06/07/2011    Procedure: HOT HEMOSTASIS (ARGON PLASMA COAGULATION/BICAP);  Surgeon: Theda Belfast, MD;  Location: [facility];  Service: Endoscopy;  Laterality: N/A;  . Esophagogastroduodenoscopy 12/20/2011    Procedure: ESOPHAGOGASTRODUODENOSCOPY (EGD);  Surgeon: Theda Belfast, MD;  Location: [facility];  Service: Endoscopy;  Laterality: N/A;  . Flexible sigmoidoscopy 12/21/2011    Procedure: FLEXIBLE SIGMOIDOSCOPY;  Surgeon: Theda Belfast, MD;  Location: [facility];  Service: Endoscopy;  Laterality: N/A;  . Carpal tunnel release     rt hand  . Partial colectomy 02/15/2012  Procedure: PARTIAL COLECTOMY;  Surgeon: Shelly Rubenstein, MD;  Location: [facility];  Service: General;  Laterality: N/A;  . Laparotomy 02/16/2012    Procedure: EXPLORATORY LAPAROTOMY;  Surgeon: Mariella Saa, MD;  Location: [facility];  Service: General;  Laterality: N/A;  oversewing of anastomotic leak and rigid sigmoidoscopy    History reviewed. No pertinent family  history.  History  Substance Use Topics  . Smoking status: Former Smoker    Types: Cigarettes  . Smokeless tobacco: Never Used  . Alcohol Use: No     Comment: occasionally    OB History    Grav Para Term Preterm Abortions TAB SAB Ect Mult Living                  Review of Systems At least 10pt or greater review of systems completed and are negative except where specified in the HPI.  Allergies  Ciprofloxacin; Morphine and related; Penicillins; and Codeine  Home Medications   Current Outpatient Rx  Name  Route  Sig  Dispense  Refill  . ASPIRIN EC 81 MG PO TBEC   Oral   Take 2 tablets (162 mg total) by mouth 2 (two) times daily.   31 tablet   1   . ATENOLOL 50 MG PO TABS   Oral   Take 1 tablet (50 mg total) by mouth every morning.   30 tablet   1   . CETIRIZINE HCL 10 MG PO TABS   Oral   Take 1 tablet (10 mg total) by mouth daily.   30 tablet   1   . ESOMEPRAZOLE MAGNESIUM 40 MG PO CPDR   Oral   Take 1 capsule (40 mg total) by mouth daily before breakfast.   30 capsule   1   . ENSURE COMPLETE PO LIQD   Oral   Take 237 mLs by mouth daily at 12 noon.   30 Bottle   3   . FLUOXETINE HCL 20 MG PO CAPS   Oral   Take 1 capsule (20 mg total) by mouth every morning.   30 capsule   3   . GABAPENTIN 300 MG PO CAPS   Oral   Take 2 capsules (600 mg total) by mouth 3 (three) times daily.   90 capsule   3   . HYDROCHLOROTHIAZIDE 12.5 MG PO TABS   Oral   Take 1 tablet (12.5 mg total) by mouth every morning.   30 tablet   0   . LISINOPRIL 40 MG PO TABS   Oral   Take 1 tablet (40 mg total) by mouth every morning.   30 tablet   0   . POTASSIUM CHLORIDE CRYS ER 20 MEQ PO TBCR   Oral   Take 1 tablet (20 mEq total) by mouth daily.   30 tablet   1   . PREDNISONE 50 MG PO TABS      Take 50 mg tablet today, take 40 mg tablet 11/06, take 30 mg tablet 11/07, take 20 mg tablet 11/08, take 10 mg tab 11/09, and then stop   15 tablet   0   . TRAMADOL HCL 50  MG PO TABS   Oral   Take 2 tablets (100 mg total) by mouth every 12 (twelve) hours as needed. For pain.   65 tablet   1   . TRAZODONE HCL 100 MG PO TABS   Oral   Take 1 tablet (100 mg total) by mouth at bedtime.  30 tablet   1   . VITAMIN C 500 MG PO TABS   Oral   Take 500 mg by mouth daily.           BP 112/64  Pulse 81  Temp 99.7 F (37.6 C) (Oral)  Resp 19  SpO2 97%  Physical Exam  Nursing notes reviewed.  Electronic medical record reviewed. VITAL SIGNS:   Filed Vitals:   04/09/12 1500 04/09/12 1503  BP: 112/64 112/64  Pulse:  81  Temp:  99.7 F (37.6 C)  TempSrc:  Oral  Resp:  19  SpO2:  97%   CONSTITUTIONAL: Awake, oriented, appears non-toxic HENT: Atraumatic, normocephalic, oral mucosa pink and moist, airway patent. Nares patent without drainage. External ears normal. EYES: Conjunctiva clear, EOMI, PERRLA NECK: Trachea midline, non-tender, supple CARDIOVASCULAR: Normal heart rate, Normal rhythm, No murmurs, rubs, gallops PULMONARY/CHEST: Clear to auscultation, no rhonchi, wheezes, or rales. Symmetrical breath sounds. Non-tender. ABDOMINAL: Non-distended, soft, non-tender - no rebound or guarding.  BS normal. NEUROLOGIC: Non-focal, moving all four extremities, no gross sensory or motor deficits. EXTREMITIES: No clubbing, cyanosis, or edema SKIN: Warm, Dry, No erythema, No rash  ED Course  Procedures (including critical care time)  Labs Reviewed  COMPREHENSIVE METABOLIC PANEL - Abnormal; Notable for the following:    Calcium 5.9 (*)     Total Protein 5.7 (*)     Albumin 2.4 (*)     AST 45 (*)     ALT 40 (*)     All other components within normal limits  MAGNESIUM - Abnormal; Notable for the following:    Magnesium 0.5 (*)     All other components within normal limits  CBC WITH DIFFERENTIAL - Abnormal; Notable for the following:    Hemoglobin 11.3 (*)     HCT 34.4 (*)     RDW 17.1 (*)     Monocytes Relative 15 (*)     Monocytes Absolute 1.4  (*)     All other components within normal limits  URINALYSIS, ROUTINE W REFLEX MICROSCOPIC - Abnormal; Notable for the following:    Leukocytes, UA TRACE (*)     All other components within normal limits  BASIC METABOLIC PANEL - Abnormal; Notable for the following:    Sodium 128 (*)  DELTA CHECK NOTED   Chloride 92 (*)     Glucose, Bld 106 (*)     Calcium 7.1 (*)     All other components within normal limits  MAGNESIUM - Abnormal; Notable for the following:    Magnesium 0.9 (*)     All other components within normal limits  URINE MICROSCOPIC-ADD ON - Abnormal; Notable for the following:    Squamous Epithelial / LPF FEW (*)     All other components within normal limits  CALCIUM - Abnormal; Notable for the following:    Calcium 8.0 (*)     All other components within normal limits  PHOSPHORUS  MAGNESIUM  LAB REPORT - SCANNED   No results found. No results found.   1. Hypocalcemia   2. Hypomagnesemia       MDM  CHARRISSE MASLEY is a 61 y.o. female [place] with some vague abdominal pain that's around her back site, this in fact was just changed today. Patient's abdomen is appropriately tender to palpation around the foam insert, does not look exceptionally red, there is no purulent drainage, drainage coming out seems to be serosanguineous and not an increased volumes. Patient is alert and oriented at  this time, she's not any tetany, but did have a low calcium by laboratory testing today. She has had a history of hypocalcemia however there is concern that there is some potential contamination with sodium citrate or EDTA possibly contributing to her falls low calcium. We'll recheck a calcium here.  Pt hypocalcemic and hypomagnesemic, pain controlled with medicine.  Pt has no other complaints.  Replete Ca++ and Mg++ in CDU.  Repeat BMP and Mg, and give a second round of electrolytes if needed.  If pt still having abnormal electrolytes or any other problems will  admit.         Jones Skene, MD 04/15/12 1726

## 2012-04-10 LAB — CALCIUM: Calcium: 8 mg/dL — ABNORMAL LOW (ref 8.4–10.5)

## 2012-04-10 LAB — MAGNESIUM
Magnesium: 0.9 mg/dL — CL (ref 1.5–2.5)
Magnesium: 1.5 mg/dL (ref 1.5–2.5)

## 2012-04-10 LAB — BASIC METABOLIC PANEL
BUN: 7 mg/dL (ref 6–23)
Calcium: 7.1 mg/dL — ABNORMAL LOW (ref 8.4–10.5)
Creatinine, Ser: 0.61 mg/dL (ref 0.50–1.10)
GFR calc Af Amer: >90 mL/min (ref >90)
GFR calc non Af Amer: >90 mL/min (ref >90)

## 2012-04-10 MED ORDER — MAGNESIUM SULFATE 40 MG/ML IJ SOLN
2.0000 g | Freq: Once | INTRAMUSCULAR | Status: DC
  Filled 2012-04-10: qty 50

## 2012-04-10 MED ORDER — MAGNESIUM SULFATE 40 MG/ML IJ SOLN
2.0000 g | Freq: Once | INTRAMUSCULAR | Status: AC
  Administered 2012-04-10: 2 g via INTRAVENOUS

## 2012-04-10 MED ORDER — CALCIUM GLUCONATE 10 % IV SOLN
3.0000 g | Freq: Once | INTRAVENOUS | Status: AC
  Administered 2012-04-10: 3 g via INTRAVENOUS
  Filled 2012-04-10: qty 30

## 2012-04-10 MED ORDER — FENTANYL CITRATE 0.05 MG/ML IJ SOLN
50.0000 ug | Freq: Once | INTRAMUSCULAR | Status: AC
  Administered 2012-04-10: 50 ug via INTRAVENOUS
  Filled 2012-04-10: qty 2

## 2012-04-10 NOTE — ED Provider Notes (Signed)
Patient received in signout from Dr. Rulon Abide Patient with low calcium and magnesium. These have been replaced. Patient to supplement her diet. She will need followup with her primary care Dr. for possible other supplementation.  Patricia Mackie, MD 04/10/12 [phone]

## 2012-04-10 NOTE — ED Notes (Signed)
Lab called to report critical lab value of Magnesium of 0.9mg /dl.  Primary RN made aware.  Primary RN to communicate with ED provider.

## 2012-04-14 ENCOUNTER — Encounter (INDEPENDENT_AMBULATORY_CARE_PROVIDER_SITE_OTHER): Payer: Self-pay | Admitting: Surgery

## 2012-04-14 ENCOUNTER — Ambulatory Visit (INDEPENDENT_AMBULATORY_CARE_PROVIDER_SITE_OTHER): Payer: Medicare Other | Admitting: Surgery

## 2012-04-14 VITALS — BP 126/84 | HR 80 | Temp 97.1°F | Resp 18 | Ht 63.5 in | Wt 175.0 lb

## 2012-04-14 DIAGNOSIS — Z09 Encounter for follow-up examination after completed treatment for conditions other than malignant neoplasm: Secondary | ICD-10-CM

## 2012-04-14 NOTE — Progress Notes (Signed)
Subjective:     Patient ID: [name], female   DOB: 30-Sep-1950, 61 y.o.   MRN: [id number]  HPI She is here today for a postop visit and wound check. She had a partial colectomy for bleeding diverticuli. This was complicated by a need for return to the operating room for an anastomotic bleed. She developed a postop wound infection and now has a wound VAC. She is in a nursing facility. She reports abdominal wall discomfort. She has no obstructive symptoms  Review of Systems     Objective:   Physical Exam On exam, her incision shows excellent granulation tissue with no evidence of infection    Assessment:     Open wound postop    Plan:     We will continue the wound VAC. I will see her back in one month

## 2012-04-15 ENCOUNTER — Ambulatory Visit (HOSPITAL_COMMUNITY)
Admission: RE | Admit: 2012-04-15 | Discharge: 2012-04-15 | Disposition: A | Discharge status: Home / Self Care | Payer: Medicare Other | Source: Ambulatory Visit | Attending: Oncology | Admitting: Oncology

## 2012-04-15 DIAGNOSIS — Z1231 Encounter for screening mammogram for malignant neoplasm of breast: Secondary | ICD-10-CM | POA: Diagnosis not present

## 2012-05-02 DIAGNOSIS — I739 Peripheral vascular disease, unspecified: Secondary | ICD-10-CM | POA: Diagnosis not present

## 2012-05-02 DIAGNOSIS — F329 Major depressive disorder, single episode, unspecified: Secondary | ICD-10-CM | POA: Diagnosis not present

## 2012-05-02 DIAGNOSIS — E876 Hypokalemia: Secondary | ICD-10-CM | POA: Diagnosis not present

## 2012-05-02 DIAGNOSIS — I10 Essential (primary) hypertension: Secondary | ICD-10-CM | POA: Diagnosis not present

## 2012-05-06 DIAGNOSIS — I10 Essential (primary) hypertension: Secondary | ICD-10-CM | POA: Diagnosis not present

## 2012-05-06 DIAGNOSIS — I6990 Unspecified sequelae of unspecified cerebrovascular disease: Secondary | ICD-10-CM | POA: Diagnosis not present

## 2012-05-06 DIAGNOSIS — G4700 Insomnia, unspecified: Secondary | ICD-10-CM | POA: Diagnosis not present

## 2012-05-06 DIAGNOSIS — K5732 Diverticulitis of large intestine without perforation or abscess without bleeding: Secondary | ICD-10-CM | POA: Diagnosis not present

## 2012-05-07 DIAGNOSIS — I739 Peripheral vascular disease, unspecified: Secondary | ICD-10-CM | POA: Diagnosis not present

## 2012-05-07 DIAGNOSIS — Z87440 Personal history of urinary (tract) infections: Secondary | ICD-10-CM | POA: Diagnosis not present

## 2012-05-07 DIAGNOSIS — M6281 Muscle weakness (generalized): Secondary | ICD-10-CM | POA: Diagnosis not present

## 2012-05-07 DIAGNOSIS — T8131XA Disruption of external operation (surgical) wound, not elsewhere classified, initial encounter: Secondary | ICD-10-CM | POA: Diagnosis not present

## 2012-05-07 DIAGNOSIS — S78119A Complete traumatic amputation at level between unspecified hip and knee, initial encounter: Secondary | ICD-10-CM | POA: Diagnosis not present

## 2012-05-07 DIAGNOSIS — K5730 Diverticulosis of large intestine without perforation or abscess without bleeding: Secondary | ICD-10-CM | POA: Diagnosis not present

## 2012-05-09 DIAGNOSIS — I739 Peripheral vascular disease, unspecified: Secondary | ICD-10-CM | POA: Diagnosis not present

## 2012-05-09 DIAGNOSIS — T8131XA Disruption of external operation (surgical) wound, not elsewhere classified, initial encounter: Secondary | ICD-10-CM | POA: Diagnosis not present

## 2012-05-09 DIAGNOSIS — K5730 Diverticulosis of large intestine without perforation or abscess without bleeding: Secondary | ICD-10-CM | POA: Diagnosis not present

## 2012-05-09 DIAGNOSIS — M6281 Muscle weakness (generalized): Secondary | ICD-10-CM | POA: Diagnosis not present

## 2012-05-09 DIAGNOSIS — Z87440 Personal history of urinary (tract) infections: Secondary | ICD-10-CM | POA: Diagnosis not present

## 2012-05-09 DIAGNOSIS — S78119A Complete traumatic amputation at level between unspecified hip and knee, initial encounter: Secondary | ICD-10-CM | POA: Diagnosis not present

## 2012-05-12 DIAGNOSIS — M6281 Muscle weakness (generalized): Secondary | ICD-10-CM | POA: Diagnosis not present

## 2012-05-12 DIAGNOSIS — S78119A Complete traumatic amputation at level between unspecified hip and knee, initial encounter: Secondary | ICD-10-CM | POA: Diagnosis not present

## 2012-05-12 DIAGNOSIS — K5730 Diverticulosis of large intestine without perforation or abscess without bleeding: Secondary | ICD-10-CM | POA: Diagnosis not present

## 2012-05-12 DIAGNOSIS — T8131XA Disruption of external operation (surgical) wound, not elsewhere classified, initial encounter: Secondary | ICD-10-CM | POA: Diagnosis not present

## 2012-05-12 DIAGNOSIS — Z87440 Personal history of urinary (tract) infections: Secondary | ICD-10-CM | POA: Diagnosis not present

## 2012-05-12 DIAGNOSIS — I739 Peripheral vascular disease, unspecified: Secondary | ICD-10-CM | POA: Diagnosis not present

## 2012-05-13 ENCOUNTER — Ambulatory Visit (INDEPENDENT_AMBULATORY_CARE_PROVIDER_SITE_OTHER): Payer: Medicare Other | Admitting: Surgery

## 2012-05-13 ENCOUNTER — Encounter (INDEPENDENT_AMBULATORY_CARE_PROVIDER_SITE_OTHER): Payer: Self-pay | Admitting: Surgery

## 2012-05-13 VITALS — BP 128/75 | HR 82 | Temp 97.0°F | Resp 16

## 2012-05-13 DIAGNOSIS — Z09 Encounter for follow-up examination after completed treatment for conditions other than malignant neoplasm: Secondary | ICD-10-CM

## 2012-05-13 NOTE — Progress Notes (Signed)
Subjective:     Patient ID: [name], female   DOB: 1950-12-13, 61 y.o.   MRN: [id number]  HPI  She is here today for another wound check.  She is gaining weight and eating well. She is still undergoing a wound VAC to the open wound   Review of Systems     Objective:   Physical Exam The wound looks excellent today with good granulation tissue. It is superficial and contracting well. It is approximately 6 cm x 4 cm in size   Assessment:     Stable post op    Plan:     We will continue the wound VAC and I will see her back in one month

## 2012-05-14 ENCOUNTER — Telehealth: Payer: Self-pay | Admitting: Medical Oncology

## 2012-05-14 ENCOUNTER — Encounter: Payer: Self-pay | Admitting: Medical Oncology

## 2012-05-14 DIAGNOSIS — I739 Peripheral vascular disease, unspecified: Secondary | ICD-10-CM | POA: Diagnosis not present

## 2012-05-14 DIAGNOSIS — S78119A Complete traumatic amputation at level between unspecified hip and knee, initial encounter: Secondary | ICD-10-CM | POA: Diagnosis not present

## 2012-05-14 DIAGNOSIS — M6281 Muscle weakness (generalized): Secondary | ICD-10-CM | POA: Diagnosis not present

## 2012-05-14 DIAGNOSIS — K5730 Diverticulosis of large intestine without perforation or abscess without bleeding: Secondary | ICD-10-CM | POA: Diagnosis not present

## 2012-05-14 DIAGNOSIS — Z87440 Personal history of urinary (tract) infections: Secondary | ICD-10-CM | POA: Diagnosis not present

## 2012-05-14 DIAGNOSIS — T8131XA Disruption of external operation (surgical) wound, not elsewhere classified, initial encounter: Secondary | ICD-10-CM | POA: Diagnosis not present

## 2012-05-14 NOTE — Telephone Encounter (Signed)
Pt called and asked if she needs to come in and get IV iron. She missed her lab/MD appointments due to surgery. I told her we are going to make her an appointment to come to see Dr. Arline Asp and get labs. Once he gets the results he will let her know if she needs to take oral Iron or get IV. She voiced understanding. POF made

## 2012-05-15 ENCOUNTER — Telehealth: Payer: Self-pay | Admitting: Oncology

## 2012-05-15 NOTE — Telephone Encounter (Signed)
LMONVM ADVIISNG THE PT OF HER APPT WITH DR Arline Asp IN [facility] 2014

## 2012-05-16 DIAGNOSIS — T8131XA Disruption of external operation (surgical) wound, not elsewhere classified, initial encounter: Secondary | ICD-10-CM | POA: Diagnosis not present

## 2012-05-16 DIAGNOSIS — M6281 Muscle weakness (generalized): Secondary | ICD-10-CM | POA: Diagnosis not present

## 2012-05-16 DIAGNOSIS — S78119A Complete traumatic amputation at level between unspecified hip and knee, initial encounter: Secondary | ICD-10-CM | POA: Diagnosis not present

## 2012-05-16 DIAGNOSIS — I739 Peripheral vascular disease, unspecified: Secondary | ICD-10-CM | POA: Diagnosis not present

## 2012-05-16 DIAGNOSIS — K5730 Diverticulosis of large intestine without perforation or abscess without bleeding: Secondary | ICD-10-CM | POA: Diagnosis not present

## 2012-05-16 DIAGNOSIS — Z87440 Personal history of urinary (tract) infections: Secondary | ICD-10-CM | POA: Diagnosis not present

## 2012-05-19 DIAGNOSIS — K5730 Diverticulosis of large intestine without perforation or abscess without bleeding: Secondary | ICD-10-CM | POA: Diagnosis not present

## 2012-05-19 DIAGNOSIS — Z87440 Personal history of urinary (tract) infections: Secondary | ICD-10-CM | POA: Diagnosis not present

## 2012-05-19 DIAGNOSIS — I739 Peripheral vascular disease, unspecified: Secondary | ICD-10-CM | POA: Diagnosis not present

## 2012-05-19 DIAGNOSIS — M6281 Muscle weakness (generalized): Secondary | ICD-10-CM | POA: Diagnosis not present

## 2012-05-19 DIAGNOSIS — S78119A Complete traumatic amputation at level between unspecified hip and knee, initial encounter: Secondary | ICD-10-CM | POA: Diagnosis not present

## 2012-05-19 DIAGNOSIS — T8131XA Disruption of external operation (surgical) wound, not elsewhere classified, initial encounter: Secondary | ICD-10-CM | POA: Diagnosis not present

## 2012-05-20 DIAGNOSIS — I739 Peripheral vascular disease, unspecified: Secondary | ICD-10-CM | POA: Diagnosis not present

## 2012-05-20 DIAGNOSIS — K5730 Diverticulosis of large intestine without perforation or abscess without bleeding: Secondary | ICD-10-CM | POA: Diagnosis not present

## 2012-05-20 DIAGNOSIS — M6281 Muscle weakness (generalized): Secondary | ICD-10-CM | POA: Diagnosis not present

## 2012-05-20 DIAGNOSIS — T8131XA Disruption of external operation (surgical) wound, not elsewhere classified, initial encounter: Secondary | ICD-10-CM | POA: Diagnosis not present

## 2012-05-20 DIAGNOSIS — S78119A Complete traumatic amputation at level between unspecified hip and knee, initial encounter: Secondary | ICD-10-CM | POA: Diagnosis not present

## 2012-05-20 DIAGNOSIS — Z87440 Personal history of urinary (tract) infections: Secondary | ICD-10-CM | POA: Diagnosis not present

## 2012-05-22 DIAGNOSIS — M6281 Muscle weakness (generalized): Secondary | ICD-10-CM | POA: Diagnosis not present

## 2012-05-22 DIAGNOSIS — S78119A Complete traumatic amputation at level between unspecified hip and knee, initial encounter: Secondary | ICD-10-CM | POA: Diagnosis not present

## 2012-05-22 DIAGNOSIS — Z87440 Personal history of urinary (tract) infections: Secondary | ICD-10-CM | POA: Diagnosis not present

## 2012-05-22 DIAGNOSIS — I739 Peripheral vascular disease, unspecified: Secondary | ICD-10-CM | POA: Diagnosis not present

## 2012-05-22 DIAGNOSIS — K5730 Diverticulosis of large intestine without perforation or abscess without bleeding: Secondary | ICD-10-CM | POA: Diagnosis not present

## 2012-05-22 DIAGNOSIS — T8131XA Disruption of external operation (surgical) wound, not elsewhere classified, initial encounter: Secondary | ICD-10-CM | POA: Diagnosis not present

## 2012-05-24 DIAGNOSIS — M6281 Muscle weakness (generalized): Secondary | ICD-10-CM | POA: Diagnosis not present

## 2012-05-24 DIAGNOSIS — Z87440 Personal history of urinary (tract) infections: Secondary | ICD-10-CM | POA: Diagnosis not present

## 2012-05-24 DIAGNOSIS — S78119A Complete traumatic amputation at level between unspecified hip and knee, initial encounter: Secondary | ICD-10-CM | POA: Diagnosis not present

## 2012-05-24 DIAGNOSIS — I739 Peripheral vascular disease, unspecified: Secondary | ICD-10-CM | POA: Diagnosis not present

## 2012-05-24 DIAGNOSIS — T8131XA Disruption of external operation (surgical) wound, not elsewhere classified, initial encounter: Secondary | ICD-10-CM | POA: Diagnosis not present

## 2012-05-24 DIAGNOSIS — K5730 Diverticulosis of large intestine without perforation or abscess without bleeding: Secondary | ICD-10-CM | POA: Diagnosis not present

## 2012-05-26 ENCOUNTER — Telehealth (INDEPENDENT_AMBULATORY_CARE_PROVIDER_SITE_OTHER): Payer: Self-pay

## 2012-05-26 DIAGNOSIS — Z87440 Personal history of urinary (tract) infections: Secondary | ICD-10-CM | POA: Diagnosis not present

## 2012-05-26 DIAGNOSIS — T8131XA Disruption of external operation (surgical) wound, not elsewhere classified, initial encounter: Secondary | ICD-10-CM | POA: Diagnosis not present

## 2012-05-26 DIAGNOSIS — S78119A Complete traumatic amputation at level between unspecified hip and knee, initial encounter: Secondary | ICD-10-CM | POA: Diagnosis not present

## 2012-05-26 DIAGNOSIS — K5730 Diverticulosis of large intestine without perforation or abscess without bleeding: Secondary | ICD-10-CM | POA: Diagnosis not present

## 2012-05-26 DIAGNOSIS — M6281 Muscle weakness (generalized): Secondary | ICD-10-CM | POA: Diagnosis not present

## 2012-05-26 DIAGNOSIS — I739 Peripheral vascular disease, unspecified: Secondary | ICD-10-CM | POA: Diagnosis not present

## 2012-05-26 NOTE — Telephone Encounter (Signed)
Pt called requesting more ours for her aid to come into her home to assist her. I also spoke with her aide. She was advised she should request her team manager from [facility] to come out and re-access her for increasing hours. She is also advised since medicaid pays for this care she will need to go thru the PCP assigned to her medicaid for approval. She states she understands and will contact her office for this. I advised her if pt has any wound concerns to please contact our office.

## 2012-05-28 DIAGNOSIS — T8131XA Disruption of external operation (surgical) wound, not elsewhere classified, initial encounter: Secondary | ICD-10-CM | POA: Diagnosis not present

## 2012-05-28 DIAGNOSIS — K5730 Diverticulosis of large intestine without perforation or abscess without bleeding: Secondary | ICD-10-CM | POA: Diagnosis not present

## 2012-05-28 DIAGNOSIS — I739 Peripheral vascular disease, unspecified: Secondary | ICD-10-CM | POA: Diagnosis not present

## 2012-05-28 DIAGNOSIS — M6281 Muscle weakness (generalized): Secondary | ICD-10-CM | POA: Diagnosis not present

## 2012-05-28 DIAGNOSIS — S78119A Complete traumatic amputation at level between unspecified hip and knee, initial encounter: Secondary | ICD-10-CM | POA: Diagnosis not present

## 2012-05-28 DIAGNOSIS — Z87440 Personal history of urinary (tract) infections: Secondary | ICD-10-CM | POA: Diagnosis not present

## 2012-05-30 DIAGNOSIS — I739 Peripheral vascular disease, unspecified: Secondary | ICD-10-CM | POA: Diagnosis not present

## 2012-05-30 DIAGNOSIS — Z87440 Personal history of urinary (tract) infections: Secondary | ICD-10-CM | POA: Diagnosis not present

## 2012-05-30 DIAGNOSIS — M6281 Muscle weakness (generalized): Secondary | ICD-10-CM | POA: Diagnosis not present

## 2012-05-30 DIAGNOSIS — S78119A Complete traumatic amputation at level between unspecified hip and knee, initial encounter: Secondary | ICD-10-CM | POA: Diagnosis not present

## 2012-05-30 DIAGNOSIS — T8131XA Disruption of external operation (surgical) wound, not elsewhere classified, initial encounter: Secondary | ICD-10-CM | POA: Diagnosis not present

## 2012-05-30 DIAGNOSIS — K5730 Diverticulosis of large intestine without perforation or abscess without bleeding: Secondary | ICD-10-CM | POA: Diagnosis not present

## 2012-06-02 ENCOUNTER — Encounter: Payer: Self-pay | Admitting: Family

## 2012-06-02 ENCOUNTER — Other Ambulatory Visit: Payer: Self-pay | Admitting: Medical Oncology

## 2012-06-02 ENCOUNTER — Ambulatory Visit (HOSPITAL_BASED_OUTPATIENT_CLINIC_OR_DEPARTMENT_OTHER): Payer: Medicare Other | Admitting: Family

## 2012-06-02 ENCOUNTER — Telehealth: Payer: Self-pay | Admitting: Oncology

## 2012-06-02 ENCOUNTER — Encounter: Payer: Self-pay | Admitting: Oncology

## 2012-06-02 ENCOUNTER — Other Ambulatory Visit (HOSPITAL_BASED_OUTPATIENT_CLINIC_OR_DEPARTMENT_OTHER): Payer: Medicare Other | Admitting: Lab

## 2012-06-02 VITALS — BP 115/58 | HR 68 | Temp 97.4°F | Resp 20 | Ht 63.5 in | Wt 171.2 lb

## 2012-06-02 DIAGNOSIS — D509 Iron deficiency anemia, unspecified: Secondary | ICD-10-CM

## 2012-06-02 DIAGNOSIS — K7460 Unspecified cirrhosis of liver: Secondary | ICD-10-CM

## 2012-06-02 DIAGNOSIS — B182 Chronic viral hepatitis C: Secondary | ICD-10-CM | POA: Diagnosis not present

## 2012-06-02 DIAGNOSIS — D62 Acute posthemorrhagic anemia: Secondary | ICD-10-CM

## 2012-06-02 LAB — CBC WITH DIFFERENTIAL/PLATELET
Basophils Absolute: 0 10*3/uL (ref 0.0–0.1)
Eosinophils Absolute: 0.2 10*3/uL (ref 0.0–0.5)
LYMPH%: 31.6 % (ref 14.0–49.7)
MCV: 92.6 fL (ref 79.5–101.0)
MONO%: 13.2 % (ref 0.0–14.0)
NEUT#: 3.1 10*3/uL (ref 1.5–6.5)
Platelets: 261 10*3/uL (ref 145–400)
RBC: 3.39 10*6/uL — ABNORMAL LOW (ref 3.70–5.45)

## 2012-06-02 LAB — COMPREHENSIVE METABOLIC PANEL (CC13)
ALT: 27 U/L (ref 0–55)
AST: 48 U/L — ABNORMAL HIGH (ref 5–34)
Albumin: 3 g/dL — ABNORMAL LOW (ref 3.5–5.0)
Calcium: 8.5 mg/dL (ref 8.4–10.4)
Chloride: 104 mEq/L (ref 98–107)
Potassium: 4.3 mEq/L (ref 3.5–5.1)
Sodium: 135 mEq/L — ABNORMAL LOW (ref 136–145)
Total Protein: 6.4 g/dL (ref 6.4–8.3)

## 2012-06-02 LAB — IRON AND TIBC: Iron: 25 ug/dL — ABNORMAL LOW (ref 42–145)

## 2012-06-02 LAB — AFP TUMOR MARKER: AFP-Tumor Marker: 17.7 ng/mL — ABNORMAL HIGH (ref 0.0–8.0)

## 2012-06-02 LAB — FERRITIN: Ferritin: 25 ng/mL (ref 10–291)

## 2012-06-02 MED ORDER — GABAPENTIN 300 MG PO CAPS: 600.0000 mg | ORAL_CAPSULE | Freq: Three times a day (TID) | ORAL | Status: DC

## 2012-06-02 MED ORDER — TRAZODONE HCL 100 MG PO TABS: 100.0000 mg | ORAL_TABLET | Freq: Every day | ORAL | Status: DC

## 2012-06-02 MED ORDER — ATENOLOL 50 MG PO TABS: 50.0000 mg | ORAL_TABLET | Freq: Every morning | ORAL | Status: DC

## 2012-06-02 MED ORDER — LISINOPRIL 40 MG PO TABS: 40.0000 mg | ORAL_TABLET | Freq: Every morning | ORAL | Status: DC

## 2012-06-02 MED ORDER — HYDROCHLOROTHIAZIDE 12.5 MG PO TABS: 12.5000 mg | ORAL_TABLET | Freq: Every morning | ORAL | Status: DC

## 2012-06-02 MED ORDER — FLUOXETINE HCL 20 MG PO CAPS: 20.0000 mg | ORAL_CAPSULE | Freq: Every morning | ORAL | Status: DC

## 2012-06-02 MED ORDER — TRAMADOL HCL 50 MG PO TABS: 100.0000 mg | ORAL_TABLET | Freq: Two times a day (BID) | ORAL | Status: DC | PRN

## 2012-06-02 MED ORDER — POTASSIUM CHLORIDE 20 MEQ PO PACK: 20.0000 meq | PACK | Freq: Every day | ORAL | Status: DC

## 2012-06-02 NOTE — Patient Instructions (Addendum)
Please contact us at [phone] if you have any questions or concerns.  Please work on obtaining a primary care physician.

## 2012-06-02 NOTE — Telephone Encounter (Signed)
appts made and printed for pt aom °

## 2012-06-02 NOTE — Progress Notes (Signed)
Patient ID: [name], female   DOB: 06/02/1950, 62 y.o.   MRN: [id number] CSN: [id number]  CC: [name]. Patricia Howard, MD  Problem List: [name] is a 62 y.o. African-American female with a problem list consisting of:  1. Recurrent iron-deficiency anemia secondary to gastrointestinal bleeding. The patient has been heavily dependent upon IV Feraheme. Over the past year or so she has been receiving about 1 Feraheme infusion monthly on average. The patient's most recent infusions of Feraheme occurred on the following dates: 06/05/2011, 07/03/2011, 07/31/2011, 08/17/2011, 08/28/2011, and the patient received 1020 mg on 10/02/2011 and 01/30/2012. [name] also has required red cell transfusions in the past, most recently early December 2012. She underwent a small bowel enteroscopy by Dr. Tilford Pillar at [facility] on 08/13/2011. The enteroscope was passed up to 6 feet into the jejunum. He saw multiple AVMs, which were ablated. Two of the large  AVMs bled during APC. These were in the 4th part of the duodenum very close to the ligament of Treitz. The remainder of the AVMs were in the proximal jejunum. In addition to the small bowel AV malformations, which were treated with APC, he saw some small esophageal varices which were not bleeding and also evidence of mild portal hypertensive gastropathy. She was admitted to the hospital from 02/14/2012 - 03/16/2013 for a lower GI bleed and underwent a left partial colectomy and takedown of splenic flexure on 02/15/2012.  She developed post operative bleeding following the partial colectomy and was transfused with PRBCs and FFP. She underwent an exploratory laparotomy and oversewing of colonic anastomosis and rigid sigmoidoscopy on 02/16/2012.  She developed mental status changes postoperatively. CAT scan of her head was unremarkable. Her mental status changes were thought to be secondary to withdrawal from multiple medications.   She was discharged from the hospital on 02/29/2012.  She went to the ER on 03/05/2012 for abdominal wound dehiscence.  ED Physicians advised by Surgeon on call for the patient to continue with dry packing and to be discharged home.  The patient received home health care with [facility] and a wound vac for home use.  She was hospitalized from 03/27/2012 - 04/01/2012 diagnosed primarily with acute encephalopathy, acute renal failure and a UTI.  During her hospitalization she received 2 units of PRBCs. 2. History of duodenal arteriovenous malformation.  3. History of hepatitis C infection.  4. Gastroesophageal reflux disease.  5. Depression.  6. Hypertension.  7. History of right Bell's palsy.  8. 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  9. Neurodermatitis.  10. Previous history of alcohol usage.  11. Admission to the hospital from 12/20/2011 through 12/25/2011 for  hematochezia felt to be secondary to a diverticular bleed,  requiring 2 units of packed red cells.  12. Diverticulosis.  13. Elevated alpha fetoprotein, 72.3 on 11/19/2011 and 76.0 on 01/11/2012.  14. Abnormal MRI of the liver from 11/28/2011 showing diffuse and  markedly low signal intensity throughout the liver as well as  diffuse low signal intensity in the adrenal glands. 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  There were no findings for cirrhosis, portal hypertension,  splenomegaly or ascites, not were there any worrisome enhancing  liver lesions.  15. Systolic ejection murmur.  Dr. Arline Asp and I saw [name] today for follow up of her recurrent iron-deficiency anemia felt to be due to GI bleeding. Initially we thought this was due  to AV malformations   Her, workup however suggested a diverticular bleed. [name] was last seen by [place] on 01/11/2012. She is accompanied today by [name] her caregiver.  [name] had  several hospitalizations and ER visits since her last office visit.  She was admitted to the hospital from 02/14/2012 - 03/16/2013 for a lower GI bleed and underwent a left partial colectomy and takedown of splenic flexure on 02/15/2012.  She developed post operative bleeding following the partial colectomy and was transfused with PRBCs and FFP. She underwent an exploratory laparotomy and oversewing of colonic anastomosis and rigid sigmoidoscopy on 02/16/2012.  She developed mental status changes postoperatively. CAT scan of her head was unremarkable. Her mental status changes were thought to be secondary to withdrawal from multiple medications.  She was discharged from the hospital on 02/29/2012.  She went to the ER on 03/05/2012 for abdominal wound dehiscence.  ED Physicians were advised by surgeon on call for the patient to continue with dry packing and to be discharged home.  The patient received home health care with [facility] and a wound vac for home use.  [name] was taken to the ER on 03/27/2012 for AMS.  She was hospitalized from 03/27/2012 - 04/01/2012 diagnosed primarily with acute encephalopathy, acute renal failure and a UTI.  During her hospitalization she received 2 units of PRBCs. She went to a SNF from 04/01/2012 - 04/09/2012.  She returned to the ER on 04/09/2012 after she was discharged from the SNF complaining of abdominal pain.  Her hypokalemia and hypomagnesemia were treated and she was discharged home.    [name] lives by herself. At the present time, she denies any evidence of GI bleeding.  She still has a wound vac connected to her umbilical area and is malodorous. Her only complaint today are several areas of hyperpigmentation on her breasts, trunk and upper extremities with hyperpigmentation.  She denies any other symptomatology.  The patient has not secured a PCP since the closure of [facility], where she was receiving her medical care and her prescriptions. At her  request, Dr. Arline Asp agreed to refill her medications and encouraged her to find a PCP. [name] states that she continues to smoke 2 cigarettes daily.  Past Medical History: Past Medical History  Diagnosis Date  . Hypertension   . Colon polyps   . Stomach problems   . Hypokalemic alkalosis   . Allergic rhinitis   . GERD (gastroesophageal reflux disease)   . Stroke 2010    no residual problems  . Shortness of breath     due to anemia  . Anemia, iron deficiency 05/03/2011    recieves iron infusions  . Anemia   . GI bleeding   . Diverticulitis   . Transfusion history   . Difficulty sleeping     takes trazadone for sleep  . Peripheral vascular disease     Surgical History: Past Surgical History  Procedure Date  . Leg amputation above knee   . Esophagogastroduodenoscopy 05/05/2011    Procedure: ESOPHAGOGASTRODUODENOSCOPY (EGD);  Surgeon: Erick Blinks, MD;  Location: [facility];  Service: Gastroenterology;  Laterality: N/A;  Dr. Rhea Belton will do procedure for Dr. Elnoria [name] Saturday.  . Esophagogastroduodenoscopy 05/08/2011    Procedure: ESOPHAGOGASTRODUODENOSCOPY (EGD);  Surgeon: Theda Belfast;  Location: [facility];  Service: Endoscopy;  Laterality: N/A;  . Esophagogastroduodenoscopy 06/07/2011    Procedure: ESOPHAGOGASTRODUODENOSCOPY (EGD);  Surgeon: Theda Belfast, MD;  Location: [facility];  Service: Endoscopy;  Laterality: N/A;  .  Hot hemostasis 06/07/2011    Procedure: HOT HEMOSTASIS (ARGON PLASMA COAGULATION/BICAP);  Surgeon: Theda Belfast, MD;  Location: [facility];  Service: Endoscopy;  Laterality: N/A;  . Esophagogastroduodenoscopy 12/20/2011    Procedure: ESOPHAGOGASTRODUODENOSCOPY (EGD);  Surgeon: Theda Belfast, MD;  Location: [facility];  Service: Endoscopy;  Laterality: N/A;  . Flexible sigmoidoscopy 12/21/2011    Procedure: FLEXIBLE SIGMOIDOSCOPY;  Surgeon: Theda Belfast, MD;  Location: [facility];  Service: Endoscopy;  Laterality: N/A;  . Carpal tunnel  release     rt hand  . Partial colectomy 02/15/2012    Procedure: PARTIAL COLECTOMY;  Surgeon: Shelly Rubenstein, MD;  Location: [facility];  Service: General;  Laterality: N/A;  . Laparotomy 02/16/2012    Procedure: EXPLORATORY LAPAROTOMY;  Surgeon: Mariella Saa, MD;  Location: [facility];  Service: General;  Laterality: N/A;  oversewing of anastomotic leak and rigid sigmoidoscopy    Current Medications: Current Outpatient Prescriptions  Medication Sig Dispense Refill  . aspirin EC 81 MG tablet Take 2 tablets (162 mg total) by mouth 2 (two) times daily.  31 tablet  1  . cetirizine (ZYRTEC) 10 MG tablet Take 1 tablet (10 mg total) by mouth daily.  30 tablet  1  . feeding supplement (ENSURE COMPLETE) LIQD Take 237 mLs by mouth daily at 12 noon.  30 Bottle  3  . HYDROcodone-acetaminophen (NORCO/VICODIN) 5-325 MG per tablet       . NEXIUM 40 MG capsule       . omeprazole (PRILOSEC) 10 MG capsule Take 10 mg by mouth daily.      . vitamin C (ASCORBIC ACID) 500 MG tablet Take 500 mg by mouth daily.      Marland Kitchen atenolol (TENORMIN) 50 MG tablet Take 1 tablet (50 mg total) by mouth every morning.  30 tablet  3  . FLUoxetine (PROZAC) 20 MG capsule Take 1 capsule (20 mg total) by mouth every morning.  30 capsule  3  . gabapentin (NEURONTIN) 300 MG capsule Take 2 capsules (600 mg total) by mouth 3 (three) times daily.  90 capsule  3  . hydrochlorothiazide (HYDRODIURIL) 12.5 MG tablet Take 1 tablet (12.5 mg total) by mouth every morning.  30 tablet  3  . lisinopril (PRINIVIL,ZESTRIL) 40 MG tablet Take 1 tablet (40 mg total) by mouth every morning.  30 tablet  3  . potassium chloride (KLOR-CON) 20 MEQ packet Take 20 mEq by mouth daily.  30 packet  3  . traMADol (ULTRAM) 50 MG tablet Take 2 tablets (100 mg total) by mouth every 12 (twelve) hours as needed. For pain.  65 tablet  1  . traZODone (DESYREL) 100 MG tablet Take 1 tablet (100 mg total) by mouth at bedtime.  30 tablet  1    Allergies: Allergies    Allergen Reactions  . Ciprofloxacin Hives and Swelling  . Morphine And Related Other (See Comments)    Makes pt sad and paranoid  . Penicillins Hives and Swelling  . Codeine Hives and Swelling    Family History: Family History  Problem Relation Age of Onset  . Cancer Father     unknown  . Hypertension Mother   . Diverticulitis Mother     Social History: History  Substance Use Topics  . Smoking status: Former Smoker    Types: Cigarettes  . Smokeless tobacco: Never Used  . Alcohol Use: Yes     Comment: Occasionally    Review of Systems: 10 Point review of systems was  completed and is negative except as noted above.   Physical Exam:   Blood pressure 115/58, pulse 68, temperature 97.4 F (36.3 C), temperature source Oral, resp. rate 20, height 5' 3.5" (1.613 m), weight 171 lb 3.2 oz (77.656 kg).  General appearance: Alert, cooperative, well nourished, no apparent distress Head: Normocephalic, without obvious abnormality, atraumatic Eyes: Conjunctivae/corneas clear, PERRLA, EOMI Nose: Nares, septum and mucosa are normal, no drainage or sinus tenderness Neck: No adenopathy, supple, symmetrical, trachea midline, thyroid not enlarged, no tenderness Resp: Clear to auscultation bilaterally Breasts:Bilateral areas of hyperpigmentation Cardio: Regular rate and rhythm, S1, S2 normal, 1/6 murmur, no click, rub or gallop GI: Soft, distended, wound vac in umbilical area, malodorous, non-tender; bowel sounds normal, no organomegaly palpable when patient in a seated position Extremities: Extremities have scattered hyperpigmentation, no edema, no cyanosis LLE AKA with prosthesis Lymph nodes: Cervical, supraclavicular, and axillary nodes normal Neurologic: Grossly normal  Laboratory Data: Results for orders placed in visit on 06/02/12 (from the past 48 hour(s))  CBC WITH DIFFERENTIAL     Status: Abnormal   Collection Time   06/02/12 10:25 AM      Component Value Range Comment   WBC  6.0  3.9 - 10.3 10e3/uL    NEUT# 3.1  1.5 - 6.5 10e3/uL    HGB 10.3 (*) 11.6 - 15.9 g/dL    HCT 96.0 (*) 45.4 - 46.6 %    Platelets 261  145 - 400 10e3/uL    MCV 92.6  79.5 - 101.0 fL    MCH 30.3  25.1 - 34.0 pg    MCHC 32.8  31.5 - 36.0 g/dL    RBC 0.98 (*) 1.19 - 5.45 10e6/uL    RDW 16.2 (*) 11.2 - 14.5 %    lymph# 1.9  0.9 - 3.3 10e3/uL    MONO# 0.8  0.1 - 0.9 10e3/uL    Eosinophils Absolute 0.2  0.0 - 0.5 10e3/uL    Basophils Absolute 0.0  0.0 - 0.1 10e3/uL    NEUT% 52.3  38.4 - 76.8 %    LYMPH% 31.6  14.0 - 49.7 %    MONO% 13.2  0.0 - 14.0 %    EOS% 2.5  0.0 - 7.0 %    BASO% 0.4  0.0 - 2.0 %   IRON AND TIBC     Status: Abnormal   Collection Time   06/02/12 10:25 AM      Component Value Range Comment   Iron 25 (*) 42 - 145 ug/dL    UIBC 147  829 - 562 ug/dL    TIBC 130  865 - 784 ug/dL    %SAT 6 (*) 20 - 55 %   FERRITIN     Status: Normal   Collection Time   06/02/12 10:25 AM      Component Value Range Comment   Ferritin 25  10 - 291 ng/mL   AFP TUMOR MARKER     Status: Abnormal   Collection Time   06/02/12 10:25 AM      Component Value Range Comment   AFP-Tumor Marker 17.7 (*) 0.0 - 8.0 ng/mL   COMPREHENSIVE METABOLIC PANEL (CC13)     Status: Abnormal   Collection Time   06/02/12 10:25 AM      Component Value Range Comment   Sodium 135 (*) 136 - 145 mEq/L    Potassium 4.3  3.5 - 5.1 mEq/L    Chloride 104  98 - 107 mEq/L    CO2 25  22 - 29 mEq/L    Glucose 93  70 - 99 mg/dl    BUN 45.4  7.0 - 09.8 mg/dL    Creatinine 0.9  0.6 - 1.1 mg/dL    Total Bilirubin 1.19  0.20 - 1.20 mg/dL    Alkaline Phosphatase 96  40 - 150 U/L    AST 48 (*) 5 - 34 U/L    ALT 27  0 - 55 U/L    Total Protein 6.4  6.4 - 8.3 g/dL    Albumin 3.0 (*) 3.5 - 5.0 g/dL    Calcium 8.5  8.4 - 14.7 mg/dL      Imaging Studies: 1. Screening bilateral mammograms from 01/19/2011 showed calcifications and possible mass involving the left breast. Additional studies were recommended. Diagnostic limited  mammogram of the left breast from 02/20/2011 showed no persistent worrisome  abnormality upon additional imaging of the left breast. Yearly screening mammography was suggested.  2. Chest x-ray, 2 view, from 05/04/2011 showed lingular scarring without active disease.  3. MRI of the abdomen with and without IV contrast from 11/28/2011 showed changes consistent with secondary or transfusional  hemosiderosis involving the liver, spleen, adrenal glands and bone marrow. There were no findings of cirrhosis, portal hypertension,  splenomegaly or ascites and no worrisome enhancing liver lesions. Gallbladder was normal. There was no common bile duct dilatation,  and the pancreatic duct was normal.  4. Nuclear medicine gastrointestinal bleeding study from 12/20/2011 showed increased radiotracer uptake within the proximal small bowel loops which may be compatible with acute proximal small bowel hemorrhage. Differential diagnosis included gastric ulcer or  erosive gastritis.  5. CT scan of the abdomen and pelvis with IV contrast from 12/23/2011 showed a few diverticula noted in the left colon. There was trace fluid and mild prominent-sized colonic diverticulum in the distal left colon in the left lower abdomen and mild thickening of the  colonic wall in this region. There was no pericolonic abscess noted. No hydronephrosis or hydroureter. 6.  Digital screening mammogram on 04/16/2012 showed no mammographic evidence of malignancy. 7.  Chest x-ray 2 view on 03/27/2012 showed no acute cardiopulmonary process. 8.  CT of the head without contrast on 03/27/2012 showed no acute intracranial abnormalities.  Mild cerebral and cerebellar atrophy with mild chronic microvascular ischemic changes. 9.  Abdominal x-ray 2 views on 04/09/2012 showed no acute abnormalities.   Procedures:  1. The patient underwent upper endoscopy on 12/20/2011 with negative findings.  2. Colonoscopy carried out by Dr. Jeani Hawking on 12/21/2011  disclosed mild diverticulosis with findings felt to be consistent with a left-  sided diverticular bleed. There were also small hemorrhoids. 3. The patient underwent a left partial colectomy and takedown of splenic flexure on 02/15/2012.  She developed post operative bleeding following the partial colectomy and was transfused with PRBCs and FFP. She underwent an exploratory laparotomy and oversewing of colonic anastomosis and rigid sigmoidoscopy on 02/16/2012.   Impression/Plan: At the present time, Ms. Sami's condition is stable. She denies any evidence of gastrointestinal bleeding. With regard to this patient's recurrent iron deficiency, we will schedule her for a Feraheme infusion and we will continue to monitor her CBC and ferritin on a monthly basis. We will plan to see her again in approximately 4 months (10/06/2012), at which time we will check CBC, chemistries, iron studies, and an alpha fetoprotein.  Ms. Mclinden's prescriptions were refilled by Dr. Arline Asp and she was strongly encouraged to find a PCP.  Extensive review of this  patient's recent hospitalization was carried out.  Ms. Duggan is encouraged to contact us in the interim if she has any questions or concerns.     Larina Bras, NP-C 06/02/2012, 8:12 PM

## 2012-06-02 NOTE — Progress Notes (Signed)
Ferritin today was 25, as compared with 446 on 03/10/2012 and 55 on 01/11/2012.  It will be recalled that on 01/30/2012 this patient received IV Feraheme 1020 mg.  I have ordered another infusion of IV Feraheme 1020 mg for 06/05/2012.

## 2012-06-03 ENCOUNTER — Telehealth: Payer: Self-pay | Admitting: Medical Oncology

## 2012-06-03 NOTE — Telephone Encounter (Signed)
I called pt per Dr. Arline Asp to inform her that her ferritin is low. He is scheduling her to get a feraheme infusion. She is aware the schedulers will call her with an appointment.

## 2012-06-04 ENCOUNTER — Telehealth: Payer: Self-pay | Admitting: Oncology

## 2012-06-04 ENCOUNTER — Telehealth: Payer: Self-pay | Admitting: *Deleted

## 2012-06-04 ENCOUNTER — Other Ambulatory Visit: Payer: Self-pay | Admitting: Oncology

## 2012-06-04 DIAGNOSIS — K5730 Diverticulosis of large intestine without perforation or abscess without bleeding: Secondary | ICD-10-CM | POA: Diagnosis not present

## 2012-06-04 DIAGNOSIS — S78119A Complete traumatic amputation at level between unspecified hip and knee, initial encounter: Secondary | ICD-10-CM | POA: Diagnosis not present

## 2012-06-04 DIAGNOSIS — Z87440 Personal history of urinary (tract) infections: Secondary | ICD-10-CM | POA: Diagnosis not present

## 2012-06-04 DIAGNOSIS — I739 Peripheral vascular disease, unspecified: Secondary | ICD-10-CM | POA: Diagnosis not present

## 2012-06-04 DIAGNOSIS — T8131XA Disruption of external operation (surgical) wound, not elsewhere classified, initial encounter: Secondary | ICD-10-CM | POA: Diagnosis not present

## 2012-06-04 DIAGNOSIS — M6281 Muscle weakness (generalized): Secondary | ICD-10-CM | POA: Diagnosis not present

## 2012-06-04 NOTE — Telephone Encounter (Signed)
Per staff phone call I have attempted to call patient regarding treatment appt. I have tried several attempts to call the home number, each time is busy. Also attempted the cell phone with no answer.   JMW

## 2012-06-04 NOTE — Telephone Encounter (Signed)
S/w pt re appt for 1/10. This is in addition to the appts pt already has.

## 2012-06-06 ENCOUNTER — Other Ambulatory Visit: Payer: Self-pay

## 2012-06-06 ENCOUNTER — Ambulatory Visit (HOSPITAL_BASED_OUTPATIENT_CLINIC_OR_DEPARTMENT_OTHER): Payer: Medicare Other

## 2012-06-06 ENCOUNTER — Telehealth: Payer: Self-pay

## 2012-06-06 VITALS — BP 113/69 | HR 67 | Temp 98.3°F

## 2012-06-06 DIAGNOSIS — Z87440 Personal history of urinary (tract) infections: Secondary | ICD-10-CM | POA: Diagnosis not present

## 2012-06-06 DIAGNOSIS — D509 Iron deficiency anemia, unspecified: Secondary | ICD-10-CM | POA: Diagnosis not present

## 2012-06-06 DIAGNOSIS — S78119A Complete traumatic amputation at level between unspecified hip and knee, initial encounter: Secondary | ICD-10-CM | POA: Diagnosis not present

## 2012-06-06 DIAGNOSIS — E876 Hypokalemia: Secondary | ICD-10-CM

## 2012-06-06 DIAGNOSIS — M6281 Muscle weakness (generalized): Secondary | ICD-10-CM | POA: Diagnosis not present

## 2012-06-06 DIAGNOSIS — K5730 Diverticulosis of large intestine without perforation or abscess without bleeding: Secondary | ICD-10-CM | POA: Diagnosis not present

## 2012-06-06 DIAGNOSIS — I739 Peripheral vascular disease, unspecified: Secondary | ICD-10-CM | POA: Diagnosis not present

## 2012-06-06 DIAGNOSIS — T8131XA Disruption of external operation (surgical) wound, not elsewhere classified, initial encounter: Secondary | ICD-10-CM | POA: Diagnosis not present

## 2012-06-06 MED ORDER — POTASSIUM CHLORIDE 20 MEQ/15ML (10%) PO SOLN: 20.0000 meq | Freq: Every day | ORAL | Status: DC

## 2012-06-06 MED ORDER — SODIUM CHLORIDE 0.9 % IV SOLN
1020.0000 mg | Freq: Once | INTRAVENOUS | Status: AC
  Administered 2012-06-06: 1020 mg via INTRAVENOUS
  Filled 2012-06-06: qty 34

## 2012-06-06 NOTE — Patient Instructions (Addendum)
Ferumoxytol injection What is this medicine? FERUMOXYTOL is an iron complex. Iron is used to make healthy red blood cells, which carry oxygen and nutrients throughout the body. This medicine is used to treat iron deficiency anemia in people with chronic kidney disease. This medicine may be used for other purposes; ask your health care provider or pharmacist if you have questions. What should I tell my health care provider before I take this medicine? They need to know if you have any of these conditions: -anemia not caused by low iron levels -high levels of iron in the blood -magnetic resonance imaging (MRI) test scheduled -an unusual or allergic reaction to iron, other medicines, foods, dyes, or preservatives -pregnant or trying to get pregnant -breast-feeding How should I use this medicine? This medicine is for infusion into a vein. It is given by a health care professional in a hospital or clinic setting. Talk to your pediatrician regarding the use of this medicine in children. Special care may be needed. Overdosage: If you think you've taken too much of this medicine contact a poison control center or emergency room at once. Overdosage: If you think you have taken too much of this medicine contact a poison control center or emergency room at once. NOTE: This medicine is only for you. Do not share this medicine with others. What if I miss a dose? It is important not to miss your dose. Call your doctor or health care professional if you are unable to keep an appointment. What may interact with this medicine? This medicine may interact with the following medications: -other iron products This list may not describe all possible interactions. Give your health care provider a list of all the medicines, herbs, non-prescription drugs, or dietary supplements you use. Also tell them if you smoke, drink alcohol, or use illegal drugs. Some items may interact with your medicine. What should I watch  for while using this medicine? Visit your doctor or healthcare professional regularly. Tell your doctor or healthcare professional if your symptoms do not start to get better or if they get worse. You may need blood work done while you are taking this medicine. You may need to follow a special diet. Talk to your doctor. Foods that contain iron include: whole grains/cereals, dried fruits, beans, or peas, leafy green vegetables, and organ meats (liver, kidney). What side effects may I notice from receiving this medicine? Side effects that you should report to your doctor or health care professional as soon as possible: -allergic reactions like skin rash, itching or hives, swelling of the face, lips, or tongue -breathing problems -changes in blood pressure -feeling faint or lightheaded, falls -fever or chills -flushing, sweating, or hot feelings -swelling of the ankles or feet Side effects that usually do not require medical attention (Report these to your doctor or health care professional if they continue or are bothersome.): -diarrhea -headache -nausea, vomiting -stomach pain This list may not describe all possible side effects. Call your doctor for medical advice about side effects. You may report side effects to FDA at 1-800-FDA-1088. Where should I keep my medicine? This drug is given in a hospital or clinic and will not be stored at home. NOTE: This sheet is a summary. It may not cover all possible information. If you have questions about this medicine, talk to your doctor, pharmacist, or health care provider.  2012, Elsevier/Gold Standard. (02/04/2008 9:48:25 PM) 

## 2012-06-06 NOTE — Telephone Encounter (Signed)
Klor-con powder not covered by patient's insurance.  Pt. preferred liquid to tablets.

## 2012-06-06 NOTE — Telephone Encounter (Signed)
lvm that potassium powder pkts are not covered by insurance. Requesting pt to let us know if she prefers liquid or tablet before we reorder.

## 2012-06-09 ENCOUNTER — Telehealth (INDEPENDENT_AMBULATORY_CARE_PROVIDER_SITE_OTHER): Payer: Self-pay | Admitting: General Surgery

## 2012-06-09 DIAGNOSIS — Z87440 Personal history of urinary (tract) infections: Secondary | ICD-10-CM | POA: Diagnosis not present

## 2012-06-09 DIAGNOSIS — M6281 Muscle weakness (generalized): Secondary | ICD-10-CM | POA: Diagnosis not present

## 2012-06-09 DIAGNOSIS — S78119A Complete traumatic amputation at level between unspecified hip and knee, initial encounter: Secondary | ICD-10-CM | POA: Diagnosis not present

## 2012-06-09 DIAGNOSIS — I739 Peripheral vascular disease, unspecified: Secondary | ICD-10-CM | POA: Diagnosis not present

## 2012-06-09 DIAGNOSIS — T8131XA Disruption of external operation (surgical) wound, not elsewhere classified, initial encounter: Secondary | ICD-10-CM | POA: Diagnosis not present

## 2012-06-09 DIAGNOSIS — K5730 Diverticulosis of large intestine without perforation or abscess without bleeding: Secondary | ICD-10-CM | POA: Diagnosis not present

## 2012-06-09 NOTE — Telephone Encounter (Signed)
Called Diane and let her know to switch to daily wet to dry dressing changes.

## 2012-06-09 NOTE — Telephone Encounter (Signed)
Called [name] from [facility] to let her know that she can do wet to dry dressing changes daily.

## 2012-06-09 NOTE — Telephone Encounter (Signed)
Diane with [facility] calling to let us know the sponge from the wound vac no longer fits into the wound and she would like an order for wet to dry dressing changes. Please call her back. [phone].

## 2012-06-09 NOTE — Telephone Encounter (Signed)
That's fine with me Wet to dry daily

## 2012-06-11 ENCOUNTER — Encounter (INDEPENDENT_AMBULATORY_CARE_PROVIDER_SITE_OTHER): Payer: Self-pay | Admitting: Surgery

## 2012-06-11 ENCOUNTER — Ambulatory Visit (INDEPENDENT_AMBULATORY_CARE_PROVIDER_SITE_OTHER): Payer: Medicare Other | Admitting: Surgery

## 2012-06-11 VITALS — BP 123/79 | HR 70 | Temp 97.8°F | Resp 16

## 2012-06-11 DIAGNOSIS — T8189XA Other complications of procedures, not elsewhere classified, initial encounter: Secondary | ICD-10-CM

## 2012-06-11 NOTE — Progress Notes (Signed)
Subjective:     Patient ID: [name], female   DOB: 15-Sep-1950, 62 y.o.   MRN: [id number]  HPI She has stopped using the wound VAC and is now undergoing wet-to-dry dressing changes. She has no abnormal complaints  Review of Systems     Objective:   Physical Exam On exam, the wound is still 2.5 cm x 6 cm. There is excellent granulation tissue which I treated with silver nitrate    Assessment:     Nonhealing surgical wound    Plan:     We will officially stop the wound VAC and continue the wet-to-dry dressing changes. I will see her back in one month

## 2012-06-12 DIAGNOSIS — S78119A Complete traumatic amputation at level between unspecified hip and knee, initial encounter: Secondary | ICD-10-CM | POA: Diagnosis not present

## 2012-06-12 DIAGNOSIS — I739 Peripheral vascular disease, unspecified: Secondary | ICD-10-CM | POA: Diagnosis not present

## 2012-06-12 DIAGNOSIS — T8131XA Disruption of external operation (surgical) wound, not elsewhere classified, initial encounter: Secondary | ICD-10-CM | POA: Diagnosis not present

## 2012-06-12 DIAGNOSIS — Z87440 Personal history of urinary (tract) infections: Secondary | ICD-10-CM | POA: Diagnosis not present

## 2012-06-12 DIAGNOSIS — M6281 Muscle weakness (generalized): Secondary | ICD-10-CM | POA: Diagnosis not present

## 2012-06-12 DIAGNOSIS — K5730 Diverticulosis of large intestine without perforation or abscess without bleeding: Secondary | ICD-10-CM | POA: Diagnosis not present

## 2012-06-17 DIAGNOSIS — Z87440 Personal history of urinary (tract) infections: Secondary | ICD-10-CM | POA: Diagnosis not present

## 2012-06-17 DIAGNOSIS — S78119A Complete traumatic amputation at level between unspecified hip and knee, initial encounter: Secondary | ICD-10-CM | POA: Diagnosis not present

## 2012-06-17 DIAGNOSIS — K5730 Diverticulosis of large intestine without perforation or abscess without bleeding: Secondary | ICD-10-CM | POA: Diagnosis not present

## 2012-06-17 DIAGNOSIS — I739 Peripheral vascular disease, unspecified: Secondary | ICD-10-CM | POA: Diagnosis not present

## 2012-06-17 DIAGNOSIS — M6281 Muscle weakness (generalized): Secondary | ICD-10-CM | POA: Diagnosis not present

## 2012-06-17 DIAGNOSIS — T8131XA Disruption of external operation (surgical) wound, not elsewhere classified, initial encounter: Secondary | ICD-10-CM | POA: Diagnosis not present

## 2012-06-20 DIAGNOSIS — K5730 Diverticulosis of large intestine without perforation or abscess without bleeding: Secondary | ICD-10-CM | POA: Diagnosis not present

## 2012-06-20 DIAGNOSIS — S78119A Complete traumatic amputation at level between unspecified hip and knee, initial encounter: Secondary | ICD-10-CM | POA: Diagnosis not present

## 2012-06-20 DIAGNOSIS — T8131XA Disruption of external operation (surgical) wound, not elsewhere classified, initial encounter: Secondary | ICD-10-CM | POA: Diagnosis not present

## 2012-06-20 DIAGNOSIS — M6281 Muscle weakness (generalized): Secondary | ICD-10-CM | POA: Diagnosis not present

## 2012-06-20 DIAGNOSIS — Z87440 Personal history of urinary (tract) infections: Secondary | ICD-10-CM | POA: Diagnosis not present

## 2012-06-20 DIAGNOSIS — I739 Peripheral vascular disease, unspecified: Secondary | ICD-10-CM | POA: Diagnosis not present

## 2012-06-24 ENCOUNTER — Ambulatory Visit (INDEPENDENT_AMBULATORY_CARE_PROVIDER_SITE_OTHER): Payer: Medicare Other | Admitting: Internal Medicine

## 2012-06-24 ENCOUNTER — Encounter: Payer: Self-pay | Admitting: Internal Medicine

## 2012-06-24 VITALS — BP 122/84 | HR 66 | Temp 97.0°F | Resp 20 | Ht 62.5 in | Wt 166.1 lb

## 2012-06-24 DIAGNOSIS — E876 Hypokalemia: Secondary | ICD-10-CM

## 2012-06-24 DIAGNOSIS — I10 Essential (primary) hypertension: Secondary | ICD-10-CM | POA: Diagnosis not present

## 2012-06-24 DIAGNOSIS — T8131XA Disruption of external operation (surgical) wound, not elsewhere classified, initial encounter: Secondary | ICD-10-CM | POA: Diagnosis not present

## 2012-06-24 DIAGNOSIS — H00019 Hordeolum externum unspecified eye, unspecified eyelid: Secondary | ICD-10-CM | POA: Diagnosis not present

## 2012-06-24 DIAGNOSIS — M6281 Muscle weakness (generalized): Secondary | ICD-10-CM | POA: Diagnosis not present

## 2012-06-24 DIAGNOSIS — S78119A Complete traumatic amputation at level between unspecified hip and knee, initial encounter: Secondary | ICD-10-CM | POA: Diagnosis not present

## 2012-06-24 DIAGNOSIS — I739 Peripheral vascular disease, unspecified: Secondary | ICD-10-CM | POA: Diagnosis not present

## 2012-06-24 DIAGNOSIS — Z87440 Personal history of urinary (tract) infections: Secondary | ICD-10-CM | POA: Diagnosis not present

## 2012-06-24 DIAGNOSIS — K5730 Diverticulosis of large intestine without perforation or abscess without bleeding: Secondary | ICD-10-CM | POA: Diagnosis not present

## 2012-06-24 MED ORDER — CETIRIZINE HCL 10 MG PO TABS: 10.0000 mg | ORAL_TABLET | Freq: Every day | ORAL | Status: DC

## 2012-06-24 MED ORDER — LISINOPRIL 40 MG PO TABS: 40.0000 mg | ORAL_TABLET | Freq: Every morning | ORAL | Status: DC

## 2012-06-24 MED ORDER — ESOMEPRAZOLE MAGNESIUM 40 MG PO CPDR: 40.0000 mg | DELAYED_RELEASE_CAPSULE | Freq: Every day | ORAL | Status: DC

## 2012-06-24 MED ORDER — GABAPENTIN 300 MG PO CAPS: 600.0000 mg | ORAL_CAPSULE | Freq: Three times a day (TID) | ORAL | Status: DC

## 2012-06-24 MED ORDER — HYDROCHLOROTHIAZIDE 12.5 MG PO TABS: 12.5000 mg | ORAL_TABLET | Freq: Every morning | ORAL | Status: DC

## 2012-06-24 MED ORDER — ATENOLOL 50 MG PO TABS: 50.0000 mg | ORAL_TABLET | Freq: Every morning | ORAL | Status: DC

## 2012-06-24 MED ORDER — HYDROCODONE-ACETAMINOPHEN 5-325 MG PO TABS: 1.0000 | ORAL_TABLET | Freq: Four times a day (QID) | ORAL | Status: DC | PRN

## 2012-06-24 MED ORDER — TRAZODONE HCL 100 MG PO TABS: 100.0000 mg | ORAL_TABLET | Freq: Every day | ORAL | Status: DC

## 2012-06-24 MED ORDER — FLUOXETINE HCL 20 MG PO CAPS: 20.0000 mg | ORAL_CAPSULE | Freq: Every morning | ORAL | Status: DC

## 2012-06-24 MED ORDER — TRAMADOL HCL 50 MG PO TABS: 100.0000 mg | ORAL_TABLET | Freq: Two times a day (BID) | ORAL | Status: DC | PRN

## 2012-06-24 MED ORDER — VITAMIN C 500 MG PO TABS: 500.0000 mg | ORAL_TABLET | Freq: Every day | ORAL | Status: DC

## 2012-06-24 MED ORDER — POTASSIUM CHLORIDE 20 MEQ/15ML (10%) PO SOLN: 20.0000 meq | Freq: Every day | ORAL | Status: DC

## 2012-06-24 NOTE — Patient Instructions (Signed)
It was a pleasure to meet you today.   I am glad you are recovering from your surgery well. Keep up the good work.   We will work on getting your old records and see you back in 1-2 months to see how you are doing.  If you have any questions please call the clinic: [phone]

## 2012-06-24 NOTE — Progress Notes (Signed)
Subjective:   Patient ID: DYNISHA DUE female   DOB: Nov 17, 1950 62 y.o.   MRN: [id number]  HPI: Ms.Atarah D Poehler is a 62 y.o. woman former help serve patient here to establish care. Patient had recent abdominal surgery for leading diverticulosis. Patient has long-standing history of iron deficiency anemia requiring AI and transfusions once a month that was a long hospital. Patient has had gastric AVMs and chronic GI bleeds. Patient also did have a left AKA back in 2009 necrotizing fasciitis. Patient does have a prosthesis and is able to ambulate but has decreased physical activity secondary to recent surgery. Patient follows up with Dr. Magnus Ivan who performed the procedure and she has home health nursing to address wound cleaning in her management along with personal care. Patient reports being in good spirits but becomes teary-eyed when describing a close relationship with her fiance who died prematurely secondary to liver cancer a few days before getting married. Family, social, and medical hx reviewed and charted with patient. Medications were also reviewed as patient had medication bottles with her all of this was entered into the chart and refills. Patient does not smoke, drink alcohol, or use other illicit drugs. Patient is also not sexually active.    Past Medical History  Diagnosis Date  . Hypertension   . Colon polyps   . Stomach problems   . Hypokalemic alkalosis   . Allergic rhinitis   . GERD (gastroesophageal reflux disease)   . Stroke 2010    no residual problems  . Shortness of breath     due to anemia  . Anemia, iron deficiency 05/03/2011    recieves iron infusions  . Anemia   . GI bleeding   . Diverticulitis   . Transfusion history   . Difficulty sleeping     takes trazadone for sleep  . Peripheral vascular disease    Current Outpatient Prescriptions  Medication Sig Dispense Refill  . aspirin EC 81 MG tablet Take 2 tablets (162 mg total) by mouth 2 (two) times  daily.  31 tablet  1  . atenolol (TENORMIN) 50 MG tablet Take 1 tablet (50 mg total) by mouth every morning.  30 tablet  3  . cetirizine (ZYRTEC) 10 MG tablet Take 1 tablet (10 mg total) by mouth daily.  30 tablet  1  . feeding supplement (ENSURE COMPLETE) LIQD Take 237 mLs by mouth daily at 12 noon.  30 Bottle  3  . FLUoxetine (PROZAC) 20 MG capsule Take 1 capsule (20 mg total) by mouth every morning.  30 capsule  3  . gabapentin (NEURONTIN) 300 MG capsule Take 2 capsules (600 mg total) by mouth 3 (three) times daily.  90 capsule  3  . hydrochlorothiazide (HYDRODIURIL) 12.5 MG tablet Take 1 tablet (12.5 mg total) by mouth every morning.  30 tablet  3  . HYDROcodone-acetaminophen (NORCO/VICODIN) 5-325 MG per tablet       . lisinopril (PRINIVIL,ZESTRIL) 40 MG tablet Take 1 tablet (40 mg total) by mouth every morning.  30 tablet  3  . NEXIUM 40 MG capsule       . omeprazole (PRILOSEC) 10 MG capsule Take 10 mg by mouth daily.      . potassium chloride 20 MEQ/15ML (10%) SOLN Take 15 mLs (20 mEq total) by mouth daily.  450 mL  1  . traMADol (ULTRAM) 50 MG tablet Take 2 tablets (100 mg total) by mouth every 12 (twelve) hours as needed. For pain.  65 tablet  1  .  traZODone (DESYREL) 100 MG tablet Take 1 tablet (100 mg total) by mouth at bedtime.  30 tablet  1  . vitamin C (ASCORBIC ACID) 500 MG tablet Take 500 mg by mouth daily.       Family History  Problem Relation Age of Onset  . Cancer Father     unknown  . Hypertension Mother   . Diverticulitis Mother    History   Social History  . Marital Status: Widowed    Spouse Name: N/A    Number of Children: N/A  . Years of Education: N/A   Social History Main Topics  . Smoking status: Current Every Day Smoker    Types: Cigarettes  . Smokeless tobacco: Never Used  . Alcohol Use: Yes     Comment: Occasionally  . Drug Use: No  . Sexually Active:    Other Topics Concern  . Not on file   Social History Narrative  . No narrative on file    Review of Systems: otherwise negative unless listed in HPI  Objective:  Physical Exam: Filed Vitals:   06/24/12 0955  BP: 122/84  Pulse: 66  Temp: 97 F (36.1 C)  TempSrc: Oral  Resp: 20  Height: 5' 2.5" (1.588 m)  Weight: 166 lb 1.6 oz (75.342 kg)  SpO2: 99%   General: sitting in wheelchair HEENT: PERRL, EOMI, no scleral icterus, red swollen upper right eyelid, normal vision with corrective lenses  Cardiac: RRR, no rubs, murmurs or gallops Pulm: clear to auscultation bilaterally, moving normal volumes of air Abd: soft, open wound midabdomen well healing, gauze c/d/i, nontender, nondistended, BS present Ext: warm and well perfused, no pedal edema Neuro: alert and oriented X3, cranial nerves II-XII grossly intact  Assessment & Plan:  1. Sty: appears to be draining. She has a long-standing history of styes and was originally seen at [facility]. She does not have acute loss of vision or eye pain. Supportive management with warm compresses and followup with [facility] was explained to the patient.   2. Health maintenance: pt is up to date on health maintenance. discussion for having pap smear once pt recovers from recent surgery. All other paps have been normal. Pt also needed refill of medications.   Pt discussed with Dr. Criselda Peaches

## 2012-06-27 DIAGNOSIS — M6281 Muscle weakness (generalized): Secondary | ICD-10-CM | POA: Diagnosis not present

## 2012-06-27 DIAGNOSIS — K5730 Diverticulosis of large intestine without perforation or abscess without bleeding: Secondary | ICD-10-CM | POA: Diagnosis not present

## 2012-06-27 DIAGNOSIS — Z87440 Personal history of urinary (tract) infections: Secondary | ICD-10-CM | POA: Diagnosis not present

## 2012-06-27 DIAGNOSIS — T8131XA Disruption of external operation (surgical) wound, not elsewhere classified, initial encounter: Secondary | ICD-10-CM | POA: Diagnosis not present

## 2012-06-27 DIAGNOSIS — I739 Peripheral vascular disease, unspecified: Secondary | ICD-10-CM | POA: Diagnosis not present

## 2012-06-27 DIAGNOSIS — S78119A Complete traumatic amputation at level between unspecified hip and knee, initial encounter: Secondary | ICD-10-CM | POA: Diagnosis not present

## 2012-07-01 DIAGNOSIS — S78119A Complete traumatic amputation at level between unspecified hip and knee, initial encounter: Secondary | ICD-10-CM | POA: Diagnosis not present

## 2012-07-01 DIAGNOSIS — I739 Peripheral vascular disease, unspecified: Secondary | ICD-10-CM | POA: Diagnosis not present

## 2012-07-01 DIAGNOSIS — Z87440 Personal history of urinary (tract) infections: Secondary | ICD-10-CM | POA: Diagnosis not present

## 2012-07-01 DIAGNOSIS — T8131XA Disruption of external operation (surgical) wound, not elsewhere classified, initial encounter: Secondary | ICD-10-CM | POA: Diagnosis not present

## 2012-07-01 DIAGNOSIS — M6281 Muscle weakness (generalized): Secondary | ICD-10-CM | POA: Diagnosis not present

## 2012-07-01 DIAGNOSIS — K5730 Diverticulosis of large intestine without perforation or abscess without bleeding: Secondary | ICD-10-CM | POA: Diagnosis not present

## 2012-07-03 DIAGNOSIS — M6281 Muscle weakness (generalized): Secondary | ICD-10-CM | POA: Diagnosis not present

## 2012-07-03 DIAGNOSIS — T8131XA Disruption of external operation (surgical) wound, not elsewhere classified, initial encounter: Secondary | ICD-10-CM | POA: Diagnosis not present

## 2012-07-03 DIAGNOSIS — Z87440 Personal history of urinary (tract) infections: Secondary | ICD-10-CM | POA: Diagnosis not present

## 2012-07-03 DIAGNOSIS — K5730 Diverticulosis of large intestine without perforation or abscess without bleeding: Secondary | ICD-10-CM | POA: Diagnosis not present

## 2012-07-03 DIAGNOSIS — I739 Peripheral vascular disease, unspecified: Secondary | ICD-10-CM | POA: Diagnosis not present

## 2012-07-03 DIAGNOSIS — S78119A Complete traumatic amputation at level between unspecified hip and knee, initial encounter: Secondary | ICD-10-CM | POA: Diagnosis not present

## 2012-07-07 ENCOUNTER — Other Ambulatory Visit (HOSPITAL_BASED_OUTPATIENT_CLINIC_OR_DEPARTMENT_OTHER): Payer: Medicare Other | Admitting: Lab

## 2012-07-07 DIAGNOSIS — K7460 Unspecified cirrhosis of liver: Secondary | ICD-10-CM | POA: Diagnosis not present

## 2012-07-07 DIAGNOSIS — D62 Acute posthemorrhagic anemia: Secondary | ICD-10-CM | POA: Diagnosis not present

## 2012-07-07 DIAGNOSIS — B182 Chronic viral hepatitis C: Secondary | ICD-10-CM | POA: Diagnosis not present

## 2012-07-07 DIAGNOSIS — D509 Iron deficiency anemia, unspecified: Secondary | ICD-10-CM

## 2012-07-07 LAB — CBC WITH DIFFERENTIAL/PLATELET
EOS%: 2.3 % (ref 0.0–7.0)
LYMPH%: 31.8 % (ref 14.0–49.7)
MCH: 31.5 pg (ref 25.1–34.0)
MCV: 94.6 fL (ref 79.5–101.0)
MONO%: 11.5 % (ref 0.0–14.0)
RBC: 3.76 10*6/uL (ref 3.70–5.45)
RDW: 14.8 % — ABNORMAL HIGH (ref 11.2–14.5)

## 2012-07-07 LAB — FERRITIN: Ferritin: 363 ng/mL — ABNORMAL HIGH (ref 10–291)

## 2012-07-08 ENCOUNTER — Encounter (INDEPENDENT_AMBULATORY_CARE_PROVIDER_SITE_OTHER): Payer: Self-pay | Admitting: Surgery

## 2012-07-08 ENCOUNTER — Ambulatory Visit (INDEPENDENT_AMBULATORY_CARE_PROVIDER_SITE_OTHER): Payer: Medicare Other | Admitting: Surgery

## 2012-07-08 VITALS — BP 126/84 | HR 78 | Temp 97.8°F | Resp 18 | Ht 64.0 in | Wt 173.0 lb

## 2012-07-08 DIAGNOSIS — T8189XA Other complications of procedures, not elsewhere classified, initial encounter: Secondary | ICD-10-CM | POA: Diagnosis not present

## 2012-07-08 MED ORDER — TRAMADOL HCL 50 MG PO TABS: 100.0000 mg | ORAL_TABLET | Freq: Two times a day (BID) | ORAL | Status: DC | PRN

## 2012-07-08 NOTE — Progress Notes (Signed)
Subjective:     Patient ID: [name], female   DOB: [birth year], 62 y.o.   MRN: [id number]  HPI She is here for another evaluation of her wound. Home health is now no longer going as the wound has been doing very well. She has been changing the dressings herself. She's been having some pain in her abdomen to the left side of the wound  Review of Systems     Objective:   Physical Exam The wound now is almost completely healed except for a very tiny strip of granulation tissue. She may be developing a hernia the left side of the abdomen    Assessment:     Nonhealing surgical wound     Plan:     She will continue the current wound care. I will see her back in one month to see if she is indeed developing a hernia

## 2012-07-29 ENCOUNTER — Other Ambulatory Visit: Payer: Self-pay | Admitting: *Deleted

## 2012-08-01 MED ORDER — HYDROCODONE-ACETAMINOPHEN 5-325 MG PO TABS: 1.0000 | ORAL_TABLET | Freq: Four times a day (QID) | ORAL | Status: DC | PRN

## 2012-08-04 ENCOUNTER — Other Ambulatory Visit (HOSPITAL_BASED_OUTPATIENT_CLINIC_OR_DEPARTMENT_OTHER): Payer: Medicare Other | Admitting: Lab

## 2012-08-04 DIAGNOSIS — K7460 Unspecified cirrhosis of liver: Secondary | ICD-10-CM | POA: Diagnosis not present

## 2012-08-04 DIAGNOSIS — D509 Iron deficiency anemia, unspecified: Secondary | ICD-10-CM | POA: Diagnosis not present

## 2012-08-04 DIAGNOSIS — B182 Chronic viral hepatitis C: Secondary | ICD-10-CM | POA: Diagnosis not present

## 2012-08-04 DIAGNOSIS — D62 Acute posthemorrhagic anemia: Secondary | ICD-10-CM | POA: Diagnosis not present

## 2012-08-04 LAB — FERRITIN: Ferritin: 110 ng/mL (ref 10–291)

## 2012-08-04 LAB — CBC WITH DIFFERENTIAL/PLATELET
Basophils Absolute: 0 10*3/uL (ref 0.0–0.1)
EOS%: 1.5 % (ref 0.0–7.0)
Eosinophils Absolute: 0.1 10*3/uL (ref 0.0–0.5)
LYMPH%: 34.6 % (ref 14.0–49.7)
MCH: 31.9 pg (ref 25.1–34.0)
MCV: 92.7 fL (ref 79.5–101.0)
MONO%: 12.6 % (ref 0.0–14.0)
NEUT#: 4.4 10*3/uL (ref 1.5–6.5)
Platelets: 217 10*3/uL (ref 145–400)
RBC: 4.2 10*6/uL (ref 3.70–5.45)
RDW: 13.7 % (ref 11.2–14.5)

## 2012-08-04 NOTE — Telephone Encounter (Signed)
Rx called in to pharmacy. 

## 2012-08-05 ENCOUNTER — Other Ambulatory Visit: Payer: Self-pay | Admitting: *Deleted

## 2012-08-05 ENCOUNTER — Ambulatory Visit (INDEPENDENT_AMBULATORY_CARE_PROVIDER_SITE_OTHER): Payer: Medicare Other | Admitting: Surgery

## 2012-08-05 ENCOUNTER — Encounter (INDEPENDENT_AMBULATORY_CARE_PROVIDER_SITE_OTHER): Payer: Self-pay | Admitting: Surgery

## 2012-08-05 VITALS — BP 152/88 | HR 72 | Temp 97.1°F | Resp 16 | Ht 63.0 in | Wt 171.8 lb

## 2012-08-05 DIAGNOSIS — E876 Hypokalemia: Secondary | ICD-10-CM

## 2012-08-05 DIAGNOSIS — T8189XD Other complications of procedures, not elsewhere classified, subsequent encounter: Secondary | ICD-10-CM

## 2012-08-05 DIAGNOSIS — Z5189 Encounter for other specified aftercare: Secondary | ICD-10-CM | POA: Diagnosis not present

## 2012-08-05 NOTE — Progress Notes (Signed)
Subjective:     Patient ID: [name], female   DOB: June 22, 1950, 62 y.o.   MRN: [id number]  HPI She is referred now for followup of her abdominal wound. She still has focal pain at the incision but no nausea, vomiting, or obstructive symptoms.  Review of Systems     Objective:   Physical Exam The wound is healed there is a very thin skin at the midportion of the incision. There is a ventral incisional hernia    Assessment:     Nonhealing surgical wound     Plan:     We will stop the dressing changes and I'll see her back in one month. She currently is asymptomatic from the hernia and given her multiple chronic comorbidities and the fact she is mostly wheelchair-bound, we will hold on repair

## 2012-08-08 MED ORDER — POTASSIUM CHLORIDE 20 MEQ/15ML (10%) PO SOLN: 20.0000 meq | Freq: Every day | ORAL | Status: DC

## 2012-08-18 ENCOUNTER — Encounter (INDEPENDENT_AMBULATORY_CARE_PROVIDER_SITE_OTHER): Payer: Self-pay | Admitting: Surgery

## 2012-08-18 ENCOUNTER — Other Ambulatory Visit (INDEPENDENT_AMBULATORY_CARE_PROVIDER_SITE_OTHER): Payer: Self-pay | Admitting: Surgery

## 2012-08-18 ENCOUNTER — Ambulatory Visit (INDEPENDENT_AMBULATORY_CARE_PROVIDER_SITE_OTHER): Payer: Medicare Other | Admitting: Surgery

## 2012-08-18 ENCOUNTER — Encounter: Payer: Self-pay | Admitting: Internal Medicine

## 2012-08-18 ENCOUNTER — Ambulatory Visit (INDEPENDENT_AMBULATORY_CARE_PROVIDER_SITE_OTHER): Payer: Medicare Other | Admitting: Internal Medicine

## 2012-08-18 VITALS — BP 150/90 | HR 68 | Temp 97.4°F | Wt 175.5 lb

## 2012-08-18 VITALS — BP 130/68 | HR 73 | Temp 98.6°F | Resp 18 | Ht 63.0 in | Wt 170.0 lb

## 2012-08-18 DIAGNOSIS — I10 Essential (primary) hypertension: Secondary | ICD-10-CM | POA: Diagnosis not present

## 2012-08-18 DIAGNOSIS — G547 Phantom limb syndrome without pain: Secondary | ICD-10-CM | POA: Diagnosis not present

## 2012-08-18 DIAGNOSIS — K439 Ventral hernia without obstruction or gangrene: Secondary | ICD-10-CM

## 2012-08-18 DIAGNOSIS — G546 Phantom limb syndrome with pain: Secondary | ICD-10-CM

## 2012-08-18 DIAGNOSIS — Z8719 Personal history of other diseases of the digestive system: Secondary | ICD-10-CM | POA: Insufficient documentation

## 2012-08-18 DIAGNOSIS — R109 Unspecified abdominal pain: Secondary | ICD-10-CM | POA: Diagnosis not present

## 2012-08-18 DIAGNOSIS — S78112A Complete traumatic amputation at level between left hip and knee, initial encounter: Secondary | ICD-10-CM | POA: Insufficient documentation

## 2012-08-18 HISTORY — DX: Personal history of other diseases of the digestive system: Z87.19

## 2012-08-18 MED ORDER — GABAPENTIN 400 MG PO CAPS: 800.0000 mg | ORAL_CAPSULE | Freq: Three times a day (TID) | ORAL | Status: DC

## 2012-08-18 MED ORDER — HYDROCHLOROTHIAZIDE 25 MG PO TABS: 25.0000 mg | ORAL_TABLET | Freq: Every day | ORAL | Status: DC

## 2012-08-18 NOTE — Progress Notes (Signed)
Subjective:   Patient ID: [name] female   DOB: 05/14/1951 62 y.o.   MRN: [id number]  HPI: [name] is a 62 y.o. woman who presents to clinic today for follow up on her chronic medical problems including hypertension.  See Problem focused Assessment and Plan for full details of her chronic medical conditions. She also has several complaints today.   She states that she has been having worsening pain for about 1 week in her lower abdomen.  She had a colectomy by Dr. Magnus Ivan and had a wound dehiscence following surgery.  She had a chronic non-healing wound that has been getting better slowly.  She is worried about the hernia that is growing around her belly button.  She states that she has been using tramadol and hydrocodone for her pain.  She has several providers given her pain medication.    She also states that since her surgery she has had more problems with phantom pain where she had her left AKA for necrotizing fascitis.  She states that she feels like there is pain between her 1st and second toe but she knows that they aren't there.  She has been using gabapentin 300 mg TID and states that it helps but she is still having breakthrough times with the pain.  The gabapentin has not been making her sleepy when she takes it.     Past Medical History  Diagnosis Date  . Hypertension   . Colon polyps   . Stomach problems   . Hypokalemic alkalosis   . Allergic rhinitis   . GERD (gastroesophageal reflux disease)   . Stroke 2010    no residual problems  . Shortness of breath     due to anemia  . Anemia, iron deficiency 05/03/2011    recieves iron infusions  . Anemia   . GI bleeding   . Diverticulitis   . Transfusion history   . Difficulty sleeping     takes trazadone for sleep  . Peripheral vascular disease    Current Outpatient Prescriptions  Medication Sig Dispense Refill  . aspirin EC 81 MG tablet Take 2 tablets (162 mg total) by mouth 2 (two) times daily.  31 tablet   1  . atenolol (TENORMIN) 50 MG tablet Take 1 tablet (50 mg total) by mouth every morning.  30 tablet  3  . cetirizine (ZYRTEC) 10 MG tablet Take 1 tablet (10 mg total) by mouth daily.  30 tablet  1  . esomeprazole (NEXIUM) 40 MG capsule Take 1 capsule (40 mg total) by mouth daily before breakfast.  30 capsule  12  . feeding supplement (ENSURE COMPLETE) LIQD Take 237 mLs by mouth daily at 12 noon.  30 Bottle  3  . FLUoxetine (PROZAC) 20 MG capsule Take 1 capsule (20 mg total) by mouth every morning.  30 capsule  3  . gabapentin (NEURONTIN) 300 MG capsule Take 2 capsules (600 mg total) by mouth 3 (three) times daily.  90 capsule  3  . hydrochlorothiazide (HYDRODIURIL) 12.5 MG tablet Take 1 tablet (12.5 mg total) by mouth every morning.  30 tablet  3  . HYDROcodone-acetaminophen (NORCO/VICODIN) 5-325 MG per tablet Take 1 tablet by mouth every 6 (six) hours as needed for pain.  60 tablet  1  . lisinopril (PRINIVIL,ZESTRIL) 40 MG tablet Take 1 tablet (40 mg total) by mouth every morning.  30 tablet  3  . omeprazole (PRILOSEC) 10 MG capsule Take 10 mg by mouth daily.      Marland Kitchen  potassium chloride 20 MEQ/15ML (10%) SOLN Take 15 mLs (20 mEq total) by mouth daily.  450 mL  1  . traMADol (ULTRAM) 50 MG tablet Take 2 tablets (100 mg total) by mouth every 12 (twelve) hours as needed. For pain.  65 tablet  1  . traZODone (DESYREL) 100 MG tablet Take 1 tablet (100 mg total) by mouth at bedtime.  30 tablet  1  . vitamin C (ASCORBIC ACID) 500 MG tablet Take 1 tablet (500 mg total) by mouth daily.  30 tablet  12   No current facility-administered medications for this visit.   Family History  Problem Relation Age of Onset  . Cancer Father     unknown  . Hypertension Mother   . Diverticulitis Mother    History   Social History  . Marital Status: Widowed    Spouse Name: N/A    Number of Children: N/A  . Years of Education: N/A   Social History Main Topics  . Smoking status: Current Every Day Smoker --  0.20 packs/day    Types: Cigarettes  . Smokeless tobacco: Never Used     Comment: wants to quit  . Alcohol Use: Yes     Comment: Occasionally  . Drug Use: No  . Sexually Active: Not on file   Other Topics Concern  . Not on file   Social History Narrative  . No narrative on file   Review of Systems: Constitutional: Denies fever, chills, diaphoresis, appetite change and fatigue.  HEENT: Denies photophobia, eye pain, redness, hearing loss, ear pain, congestion, sore throat, rhinorrhea, sneezing, mouth sores, trouble swallowing, neck pain, neck stiffness and tinnitus.   Respiratory: Denies SOB, DOE, cough, chest tightness,  and wheezing.   Cardiovascular: Denies chest pain, palpitations and leg swelling.  Gastrointestinal: Positive for abdominal pain. Denies nausea, vomiting, diarrhea, constipation, blood in stool and abdominal distention.  Genitourinary: Denies dysuria, urgency, frequency, hematuria, flank pain and difficulty urinating.  Musculoskeletal: Denies myalgias, back pain, joint swelling, arthralgias and gait problem.  Skin: Denies pallor, rash and wound.  Neurological: Denies dizziness, seizures, syncope, weakness, light-headedness, numbness and headaches.  Hematological: Denies adenopathy. Easy bruising, personal or family bleeding history  Psychiatric/Behavioral: Denies suicidal ideation, mood changes, confusion, nervousness, sleep disturbance and agitation   Objective:  Physical Exam: Filed Vitals:   08/18/12 0925  BP: 150/90  Pulse: 68  Temp: 97.4 F (36.3 C)  TempSrc: Oral  Weight: 175 lb 8 oz (79.606 kg)  SpO2: 100%   Constitutional: Vital signs reviewed.  Patient is a well-developed and well-nourished woman in no acute distress and cooperative with exam. Alert and oriented x3.  Head: Normocephalic and atraumatic Ear: TM normal bilaterally Mouth: no erythema or exudates, MMM Eyes: PERRL, EOMI, conjunctivae normal, No scleral icterus.  Neck: Supple, Trachea  midline normal ROM, No JVD, mass, thyromegaly, or carotid bruit present.  Cardiovascular: RRR, S1 normal, S2 normal, no MRG, pulses symmetric and intact bilaterally Pulmonary/Chest: CTAB, no wheezes, rales, or rhonchi Abdominal: midline scar noted with a small area of granulating wound on the distal portion.  Not drainage noted.  There is a left sided periumbilical hernia that is reducible.  Soft. Mild tenderness around the distal part of the surgical scar with no areas of induration or erythema, non-distended, bowel sounds are normal, no masses, organomegaly, or guarding present.  GU: no CVA tenderness Musculoskeletal: Left AKA noted with no stump break down and a well fitting prosthesis.  No joint deformities, erythema, or stiffness, ROM full  and no nontender Hematology: no cervical, inginal, or axillary adenopathy.  Neurological: A&O x3, Strength is normal and symmetric bilaterally, cranial nerve II-XII are grossly intact, no focal motor deficit, sensory intact to light touch bilaterally.  Skin: Warm, dry and intact. No rash, cyanosis, or clubbing.  Psychiatric: Normal mood and affect. speech and behavior is normal. Judgment and thought content normal. Cognition and memory are normal.   Assessment & Plan:

## 2012-08-18 NOTE — Patient Instructions (Addendum)
1.  Pick up the refill of the Hydrocodone today and use it for the pain  - Make sure you talk to Dr. Magnus Ivan about the wound and pain control  2.  Increase the Gabapentin to 400 mg capsules.  Take 2 capsules three times daily for your pain in your foot.   3.  Follow up with your PCP in about 1 month to recheck your blood pressure.

## 2012-08-18 NOTE — Progress Notes (Signed)
Subjective:     Patient ID: [name], female   DOB: [birth year], 62 y.o.   MRN: [id number]  HPI She is here for another visit. She is complaining of a one-week history of abdominal pain around her umbilicus. She reports the pain is moderate to severe. She has no nausea or vomiting. She reports her bowel movements are normal.  Review of Systems     Objective:   Physical Exam On exam, she has a reducible ventral hernia. She has her chronic wound at the umbilicus which appears to be healing well without evidence of infection.  Her abdomen is tender at the hernia with guarding    Assessment:     Symptomatic incisional hernia    Plan:     I am going to get  a CAT scan of her abdomen and pelvis to see if there is anything concerning for incarceration that would explain her tenderness and pain.    I wrote her for Percocet for pain. I will see her back after this scan.

## 2012-08-21 ENCOUNTER — Ambulatory Visit
Admission: RE | Admit: 2012-08-21 | Discharge: 2012-08-21 | Disposition: A | Discharge status: Home / Self Care | Payer: Medicare Other | Source: Ambulatory Visit | Attending: Surgery | Admitting: Surgery

## 2012-08-21 DIAGNOSIS — K439 Ventral hernia without obstruction or gangrene: Secondary | ICD-10-CM

## 2012-08-21 DIAGNOSIS — R109 Unspecified abdominal pain: Secondary | ICD-10-CM | POA: Diagnosis not present

## 2012-08-21 MED ORDER — IOHEXOL 300 MG/ML  SOLN
100.0000 mL | Freq: Once | INTRAMUSCULAR | Status: AC | PRN
  Administered 2012-08-21: 100 mL via INTRAVENOUS

## 2012-08-22 NOTE — Assessment & Plan Note (Signed)
She has an appointment with Dr. Magnus Ivan today.  The hernia is reducible and, while mildly painful, she doesn't have an acute abdomen today that would indicate incarceration.  I will defer management to Dr. Magnus Ivan at this time.  She has a refill of her hydrocodone from her last visit that is eligible to be picked up today.

## 2012-08-22 NOTE — Assessment & Plan Note (Signed)
She continues to have phantom pain following her left AKA.  We will increase her gabapentin to 400 mg TID and if that helps some and doesn't give her side effects we could increase to 600 mg TID.

## 2012-08-22 NOTE — Assessment & Plan Note (Signed)
BP Readings from Last 3 Encounters:  08/18/12 130/68  08/18/12 150/90  08/05/12 152/88    Lab Results  Component Value Date   NA 135* 06/02/2012   K 4.3 06/02/2012   CREATININE 0.9 06/02/2012    Assessment:  Blood pressure control: mildly elevated  Progress toward BP goal:  unchanged  Comments: her BP is mildly elevated over her goal of <140/90.  She states taht she has been taking her medications as prescribed and has been high several times in the past.   Plan:  Medications:  continue current medications including lisinopril 40 mg, Atenolol 50 mg daily  Educational resources provided: brochure  Self management tools provided:    Other plans: We will increase her HCTZ today to 25 mg daily and have her return in about 2 weeks to recheck her blood pressure.

## 2012-08-25 ENCOUNTER — Encounter (INDEPENDENT_AMBULATORY_CARE_PROVIDER_SITE_OTHER): Payer: Medicare Other | Admitting: Surgery

## 2012-09-01 ENCOUNTER — Other Ambulatory Visit (HOSPITAL_BASED_OUTPATIENT_CLINIC_OR_DEPARTMENT_OTHER): Payer: Medicare Other | Admitting: Lab

## 2012-09-01 ENCOUNTER — Other Ambulatory Visit: Payer: Self-pay | Admitting: *Deleted

## 2012-09-01 ENCOUNTER — Other Ambulatory Visit: Payer: Self-pay | Admitting: Medical Oncology

## 2012-09-01 ENCOUNTER — Encounter: Payer: Self-pay | Admitting: Internal Medicine

## 2012-09-01 ENCOUNTER — Telehealth: Payer: Self-pay | Admitting: Medical Oncology

## 2012-09-01 DIAGNOSIS — B182 Chronic viral hepatitis C: Secondary | ICD-10-CM

## 2012-09-01 DIAGNOSIS — D62 Acute posthemorrhagic anemia: Secondary | ICD-10-CM

## 2012-09-01 DIAGNOSIS — R269 Unspecified abnormalities of gait and mobility: Secondary | ICD-10-CM | POA: Insufficient documentation

## 2012-09-01 DIAGNOSIS — D509 Iron deficiency anemia, unspecified: Secondary | ICD-10-CM

## 2012-09-01 DIAGNOSIS — K7460 Unspecified cirrhosis of liver: Secondary | ICD-10-CM

## 2012-09-01 LAB — FERRITIN: Ferritin: 66 ng/mL (ref 10–291)

## 2012-09-01 LAB — CBC WITH DIFFERENTIAL/PLATELET
BASO%: 0.6 % (ref 0.0–2.0)
EOS%: 1.9 % (ref 0.0–7.0)
HCT: 39.7 % (ref 34.8–46.6)
LYMPH%: 30.4 % (ref 14.0–49.7)
MCH: 31.2 pg (ref 25.1–34.0)
MCHC: 33.5 g/dL (ref 31.5–36.0)
MONO%: 13.1 % (ref 0.0–14.0)
NEUT%: 54 % (ref 38.4–76.8)
Platelets: 240 10*3/uL (ref 145–400)
RBC: 4.26 10*6/uL (ref 3.70–5.45)
WBC: 7.7 10*3/uL (ref 3.9–10.3)

## 2012-09-01 NOTE — Telephone Encounter (Signed)
Pt called and request refill on Vicodin.   I called [facility]  For refill history and they report Last filled 3/11 and 3/25. They filled twice this month because the directions state take every 6 hours.  If you only want her to have # 60 a month you need to change directions to twice a day. Will you refill?

## 2012-09-01 NOTE — Telephone Encounter (Signed)
Pt called and left a message that she missed her lab appointment this am. I attempted to call her back but her line is busy. POF made to reschedule.

## 2012-09-02 ENCOUNTER — Telehealth: Payer: Self-pay | Admitting: *Deleted

## 2012-09-02 ENCOUNTER — Telehealth (INDEPENDENT_AMBULATORY_CARE_PROVIDER_SITE_OTHER): Payer: Self-pay | Admitting: *Deleted

## 2012-09-02 ENCOUNTER — Telehealth: Payer: Self-pay | Admitting: Medical Oncology

## 2012-09-02 ENCOUNTER — Ambulatory Visit (INDEPENDENT_AMBULATORY_CARE_PROVIDER_SITE_OTHER): Payer: Medicare Other | Admitting: Surgery

## 2012-09-02 ENCOUNTER — Encounter (INDEPENDENT_AMBULATORY_CARE_PROVIDER_SITE_OTHER): Payer: Self-pay | Admitting: Surgery

## 2012-09-02 VITALS — BP 138/94 | HR 68 | Temp 98.8°F | Resp 16 | Ht 62.0 in | Wt 170.0 lb

## 2012-09-02 DIAGNOSIS — K432 Incisional hernia without obstruction or gangrene: Secondary | ICD-10-CM | POA: Diagnosis not present

## 2012-09-02 NOTE — Telephone Encounter (Signed)
[facility] called to make [place] aware that patient is receiving multiple prescriptions for narcotic medications both Oxycodone and Hydrocodone.  Also spoke to [facility] who is pharmacy listed in patient chart who states they received a prescription this morning called in for Hydrocodone for the patient.  When patient was told at [facility] they were going to call us due to the number of multiple physicians prescribing narcotics patient demanded the prescription back and left.  This RN did make [facility] aware of the other prescriptions being filled at [facility].

## 2012-09-02 NOTE — Progress Notes (Signed)
Subjective:     Patient ID: [name], female   DOB: 1950/08/31, 62 y.o.   MRN: [id number]  HPI She is here for a followup visit after her CAT scan of the abdomen and pelvis. She has occasional mild abdominal discomfort but no nausea or vomiting. She is moving her bowels well.  Review of Systems     Objective:   Physical Exam On exam, her abdomen is minimally tender. There is an obvious hernia. CAT scan confirms a very large incisional hernia with laxity of the abdominal wall. There was no evidence of obstruction    Assessment:     Incisional hernia     Plan:     I explained the diagnosis to her. She is not interested in surgery at this time which fills reasonable as she is minimally symptomatic and mostly in a wheelchair. I will continue to follow as long as her back in 2 months. She will come back sooner should she develop further symptoms

## 2012-09-02 NOTE — Telephone Encounter (Signed)
Will run narcotic data base on pt and send to PCP

## 2012-09-02 NOTE — Telephone Encounter (Signed)
Pt informed

## 2012-09-03 ENCOUNTER — Encounter: Payer: Self-pay | Admitting: Oncology

## 2012-09-03 ENCOUNTER — Telehealth: Payer: Self-pay | Admitting: *Deleted

## 2012-09-03 NOTE — Telephone Encounter (Signed)
I entered an [place]. I do not think we can safely Rx controlled substances before we see the pt.

## 2012-09-03 NOTE — Progress Notes (Signed)
This patient received IV Feraheme 1020 mg on 06/06/2012.  Ferritin prior to IV iron was 25 on 06/02/2012. On 07/07/2012, ferritin was 363. Since then, the ferritin value has rapidly dropped. On 09/01/2012, ferritin was 66.   I have ordered another dose of IV Feraheme 1020 mg to be given around 09/09/2012.    It will be recalled that we are checking this patient's CBC and ferritin every month. She is scheduled to see me on 10/04/2012.

## 2012-09-03 NOTE — Telephone Encounter (Signed)
Obtained [place] narc database printout, spoke w/ dr Midwife, called pt to schedule an appt next week, she sounded sleepy and stated that she would have to speak w/ her caregiver and have them call at the end of the week for an appt, will forward to gayle, dr Midwife and dr Burtis Junes

## 2012-09-04 ENCOUNTER — Telehealth: Payer: Self-pay | Admitting: Medical Oncology

## 2012-09-04 NOTE — Telephone Encounter (Signed)
I called pt to inform her that her ferritin has dropped. Dr. Arline Asp is scheduling her for an infusion of feraheme. She is aware the schedulers will call her with an appointment.

## 2012-09-04 NOTE — Telephone Encounter (Signed)
Attempted to call pt about a refill.No answer or answering machine

## 2012-09-08 ENCOUNTER — Telehealth: Payer: Self-pay | Admitting: Oncology

## 2012-09-08 NOTE — Telephone Encounter (Signed)
S/w pt re appt for 4/16.

## 2012-09-10 ENCOUNTER — Ambulatory Visit (HOSPITAL_BASED_OUTPATIENT_CLINIC_OR_DEPARTMENT_OTHER): Payer: Medicare Other

## 2012-09-10 VITALS — BP 168/96 | HR 73 | Temp 98.5°F | Resp 20

## 2012-09-10 DIAGNOSIS — D509 Iron deficiency anemia, unspecified: Secondary | ICD-10-CM

## 2012-09-10 MED ORDER — SODIUM CHLORIDE 0.9 % IV SOLN
1020.0000 mg | Freq: Once | INTRAVENOUS | Status: AC
  Administered 2012-09-10: 1020 mg via INTRAVENOUS
  Filled 2012-09-10: qty 34

## 2012-09-10 NOTE — Patient Instructions (Addendum)
Ferumoxytol injection What is this medicine? FERUMOXYTOL is an iron complex. Iron is used to make healthy red blood cells, which carry oxygen and nutrients throughout the body. This medicine is used to treat iron deficiency anemia in people with chronic kidney disease. This medicine may be used for other purposes; ask your health care provider or pharmacist if you have questions. What should I tell my health care provider before I take this medicine? They need to know if you have any of these conditions: -anemia not caused by low iron levels -high levels of iron in the blood -magnetic resonance imaging (MRI) test scheduled -an unusual or allergic reaction to iron, other medicines, foods, dyes, or preservatives -pregnant or trying to get pregnant -breast-feeding How should I use this medicine? This medicine is for infusion into a vein. It is given by a health care professional in a hospital or clinic setting. Talk to your pediatrician regarding the use of this medicine in children. Special care may be needed. Overdosage: If you think you've taken too much of this medicine contact a poison control center or emergency room at once. Overdosage: If you think you have taken too much of this medicine contact a poison control center or emergency room at once. NOTE: This medicine is only for you. Do not share this medicine with others. What if I miss a dose? It is important not to miss your dose. Call your doctor or health care professional if you are unable to keep an appointment. What may interact with this medicine? This medicine may interact with the following medications: -other iron products This list may not describe all possible interactions. Give your health care provider a list of all the medicines, herbs, non-prescription drugs, or dietary supplements you use. Also tell them if you smoke, drink alcohol, or use illegal drugs. Some items may interact with your medicine. What should I watch  for while using this medicine? Visit your doctor or healthcare professional regularly. Tell your doctor or healthcare professional if your symptoms do not start to get better or if they get worse. You may need blood work done while you are taking this medicine. You may need to follow a special diet. Talk to your doctor. Foods that contain iron include: whole grains/cereals, dried fruits, beans, or peas, leafy green vegetables, and organ meats (liver, kidney). What side effects may I notice from receiving this medicine? Side effects that you should report to your doctor or health care professional as soon as possible: -allergic reactions like skin rash, itching or hives, swelling of the face, lips, or tongue -breathing problems -changes in blood pressure -feeling faint or lightheaded, falls -fever or chills -flushing, sweating, or hot feelings -swelling of the ankles or feet Side effects that usually do not require medical attention (Report these to your doctor or health care professional if they continue or are bothersome.): -diarrhea -headache -nausea, vomiting -stomach pain This list may not describe all possible side effects. Call your doctor for medical advice about side effects. You may report side effects to FDA at 1-800-FDA-1088. Where should I keep my medicine? This drug is given in a hospital or clinic and will not be stored at home. NOTE: This sheet is a summary. It may not cover all possible information. If you have questions about this medicine, talk to your doctor, pharmacist, or health care provider.  2013, [employer]. (02/04/2008 9:48:25 PM)  

## 2012-09-16 ENCOUNTER — Telehealth: Payer: Self-pay | Admitting: Oncology

## 2012-09-30 ENCOUNTER — Telehealth: Payer: Self-pay | Admitting: Oncology

## 2012-09-30 ENCOUNTER — Other Ambulatory Visit: Payer: Self-pay

## 2012-09-30 ENCOUNTER — Telehealth: Payer: Self-pay

## 2012-09-30 NOTE — Telephone Encounter (Signed)
per Olegario Messier...Dr. Arline Asp needd to leave early on 5.9.14 and wanted to move this pt to thurs...per Olegario Messier she will contact pt.

## 2012-09-30 NOTE — Telephone Encounter (Signed)
lvm that Dr Arline Asp needs to leave early on Friday so we moved her appt. to 230 pm on Thursday. Asked her to call us if this will not work.

## 2012-10-01 ENCOUNTER — Other Ambulatory Visit: Payer: Self-pay | Admitting: *Deleted

## 2012-10-01 DIAGNOSIS — E876 Hypokalemia: Secondary | ICD-10-CM

## 2012-10-02 ENCOUNTER — Telehealth (INDEPENDENT_AMBULATORY_CARE_PROVIDER_SITE_OTHER): Payer: Self-pay

## 2012-10-02 ENCOUNTER — Other Ambulatory Visit: Payer: Medicare Other | Admitting: Lab

## 2012-10-02 ENCOUNTER — Ambulatory Visit: Payer: Medicare Other | Admitting: Oncology

## 2012-10-02 NOTE — Telephone Encounter (Signed)
I tried calling the pt back to let her know we just faxed the refill authorization for Tramadol 50mg  po 2 tabs q 12 hrs prn pain #65 to [facility].

## 2012-10-02 NOTE — Telephone Encounter (Signed)
Message copied by Ivory Broad on Thu Oct 02, 2012  3:18 PM ------      Message from: Rise Paganini      Created: Thu Oct 02, 2012 12:34 PM      Regarding: [place]: [phone]       Patient stated that she has been waiting to hear from you about refilling her medication. Please call. Thank you. ------

## 2012-10-03 ENCOUNTER — Ambulatory Visit: Payer: Medicare Other | Admitting: Oncology

## 2012-10-03 ENCOUNTER — Telehealth: Payer: Self-pay | Admitting: Oncology

## 2012-10-03 ENCOUNTER — Other Ambulatory Visit: Payer: Medicare Other | Admitting: Lab

## 2012-10-03 ENCOUNTER — Encounter: Payer: Self-pay | Admitting: Internal Medicine

## 2012-10-03 ENCOUNTER — Ambulatory Visit (INDEPENDENT_AMBULATORY_CARE_PROVIDER_SITE_OTHER): Payer: Medicare Other | Admitting: Internal Medicine

## 2012-10-03 ENCOUNTER — Encounter: Payer: Self-pay | Admitting: Oncology

## 2012-10-03 ENCOUNTER — Ambulatory Visit (HOSPITAL_BASED_OUTPATIENT_CLINIC_OR_DEPARTMENT_OTHER): Payer: Medicare Other | Admitting: Oncology

## 2012-10-03 ENCOUNTER — Other Ambulatory Visit (HOSPITAL_BASED_OUTPATIENT_CLINIC_OR_DEPARTMENT_OTHER): Payer: Medicare Other | Admitting: Lab

## 2012-10-03 VITALS — BP 148/83 | HR 68 | Temp 98.2°F | Resp 18 | Ht 62.0 in | Wt 176.8 lb

## 2012-10-03 VITALS — BP 143/87 | HR 72 | Temp 97.8°F | Ht 62.9 in | Wt 177.8 lb

## 2012-10-03 DIAGNOSIS — T8189XA Other complications of procedures, not elsewhere classified, initial encounter: Secondary | ICD-10-CM | POA: Diagnosis not present

## 2012-10-03 DIAGNOSIS — D500 Iron deficiency anemia secondary to blood loss (chronic): Secondary | ICD-10-CM

## 2012-10-03 DIAGNOSIS — D62 Acute posthemorrhagic anemia: Secondary | ICD-10-CM

## 2012-10-03 DIAGNOSIS — D509 Iron deficiency anemia, unspecified: Secondary | ICD-10-CM

## 2012-10-03 DIAGNOSIS — K7460 Unspecified cirrhosis of liver: Secondary | ICD-10-CM

## 2012-10-03 DIAGNOSIS — K922 Gastrointestinal hemorrhage, unspecified: Secondary | ICD-10-CM | POA: Diagnosis not present

## 2012-10-03 DIAGNOSIS — B182 Chronic viral hepatitis C: Secondary | ICD-10-CM

## 2012-10-03 LAB — COMPREHENSIVE METABOLIC PANEL (CC13)
ALT: 91 U/L — ABNORMAL HIGH (ref 0–55)
AST: 120 U/L — ABNORMAL HIGH (ref 5–34)
Albumin: 3.3 g/dL — ABNORMAL LOW (ref 3.5–5.0)
Alkaline Phosphatase: 131 U/L (ref 40–150)
Glucose: 154 mg/dl — ABNORMAL HIGH (ref 70–99)
Potassium: 3.6 mEq/L (ref 3.5–5.1)
Sodium: 140 mEq/L (ref 136–145)
Total Bilirubin: 0.33 mg/dL (ref 0.20–1.20)
Total Protein: 6.7 g/dL (ref 6.4–8.3)

## 2012-10-03 LAB — CBC WITH DIFFERENTIAL/PLATELET
BASO%: 0.7 % (ref 0.0–2.0)
Basophils Absolute: 0 10*3/uL (ref 0.0–0.1)
EOS%: 1.7 % (ref 0.0–7.0)
HCT: 32.1 % — ABNORMAL LOW (ref 34.8–46.6)
HGB: 10.5 g/dL — ABNORMAL LOW (ref 11.6–15.9)
MONO#: 0.6 10*3/uL (ref 0.1–0.9)
NEUT%: 49.8 % (ref 38.4–76.8)
RDW: 15.3 % — ABNORMAL HIGH (ref 11.2–14.5)
WBC: 4.8 10*3/uL (ref 3.9–10.3)
lymph#: 1.6 10*3/uL (ref 0.9–3.3)

## 2012-10-03 MED ORDER — POTASSIUM CHLORIDE 20 MEQ/15ML (10%) PO SOLN: 20.0000 meq | Freq: Every day | ORAL | Status: DC

## 2012-10-03 MED ORDER — OXYCODONE-ACETAMINOPHEN 10-325 MG PO TABS: 1.0000 | ORAL_TABLET | Freq: Four times a day (QID) | ORAL | Status: DC | PRN

## 2012-10-03 NOTE — Telephone Encounter (Signed)
gv and printed ppt sched and avs for pt for June thru Sept

## 2012-10-03 NOTE — Progress Notes (Signed)
Subjective:   Patient ID: [name] female   DOB: June 15, 1950 62 y.o.   MRN: [id number]  HPI: [name] is a 62 y.o. woman pmh of HTN, severe Fe deficiency anemia requiring transfusions, and non-healing surgical abdominal wound coming in to sign pain contract. Pt has been seen and f/u with gen surgery regarding inability to heal and developing large perisurgical hernia that is causing considerable pain. She has been taking some Percocet 10-325mg  from the surgeons but has not established pain contract with them. Pt has an aid that distributes her medication and cleans at home. Pt does have legitimate pain and no apparent hx (that was reviewed with patient) in terms of high risk addictive/drug abusive behavior. Will re-eval once pt f/u with gen surgery as well.     Past Medical History  Diagnosis Date  . Hypertension   . Colon polyps   . Stomach problems   . Hypokalemic alkalosis   . Allergic rhinitis   . GERD (gastroesophageal reflux disease)   . Stroke 2010    no residual problems  . Shortness of breath     due to anemia  . Anemia, iron deficiency 05/03/2011    recieves iron infusions  . Anemia   . GI bleeding   . Diverticulitis   . Transfusion history   . Difficulty sleeping     takes trazadone for sleep  . Peripheral vascular disease    Current Outpatient Prescriptions  Medication Sig Dispense Refill  . aspirin EC 81 MG tablet Take 2 tablets (162 mg total) by mouth 2 (two) times daily.  31 tablet  1  . atenolol (TENORMIN) 50 MG tablet Take 1 tablet (50 mg total) by mouth every morning.  30 tablet  3  . cetirizine (ZYRTEC) 10 MG tablet Take 1 tablet (10 mg total) by mouth daily.  30 tablet  1  . esomeprazole (NEXIUM) 40 MG capsule Take 1 capsule (40 mg total) by mouth daily before breakfast.  30 capsule  12  . FLUoxetine (PROZAC) 20 MG capsule Take 1 capsule (20 mg total) by mouth every morning.  30 capsule  3  . gabapentin (NEURONTIN) 400 MG capsule Take 2 capsules  (800 mg total) by mouth 3 (three) times daily.  90 capsule  3  . hydrochlorothiazide (HYDRODIURIL) 25 MG tablet Take 1 tablet (25 mg total) by mouth daily.  30 tablet  3  . HYDROcodone-acetaminophen (NORCO/VICODIN) 5-325 MG per tablet Take 1 tablet by mouth every 6 (six) hours as needed for pain.  60 tablet  1  . lisinopril (PRINIVIL,ZESTRIL) 40 MG tablet Take 1 tablet (40 mg total) by mouth every morning.  30 tablet  3  . oxyCODONE-acetaminophen (PERCOCET/ROXICET) 5-325 MG per tablet as needed.      . potassium chloride 20 MEQ/15ML (10%) SOLN Take 15 mLs (20 mEq total) by mouth daily.  450 mL  1  . traMADol (ULTRAM) 50 MG tablet Take 2 tablets (100 mg total) by mouth every 12 (twelve) hours as needed. For pain.  65 tablet  1  . traZODone (DESYREL) 100 MG tablet Take 1 tablet (100 mg total) by mouth at bedtime.  30 tablet  1  . vitamin C (ASCORBIC ACID) 500 MG tablet Take 1 tablet (500 mg total) by mouth daily.  30 tablet  12   No current facility-administered medications for this visit.   Family History  Problem Relation Age of Onset  . Cancer Father     unknown  .  Hypertension Mother   . Diverticulitis Mother    History   Social History  . Marital Status: Widowed    Spouse Name: N/A    Number of Children: N/A  . Years of Education: N/A   Social History Main Topics  . Smoking status: Current Every Day Smoker -- 0.20 packs/day    Types: Cigarettes  . Smokeless tobacco: Never Used     Comment: wants to quit  . Alcohol Use: Yes     Comment: Occasionally  . Drug Use: No  . Sexually Active: None   Other Topics Concern  . None   Social History Narrative  . None   Review of Systems: otherwise negative unless   Objective:  Physical Exam: Filed Vitals:   10/03/12 1417  BP: 143/87  Pulse: 72  Temp: 97.8 F (36.6 C)  TempSrc: Oral  Height: 5' 2.9" (1.598 m)  Weight: 177 lb 12.8 oz (80.65 kg)  SpO2: 99%   General: resting in bed HEENT: PERRL, EOMI, no scleral  icterus Cardiac: RRR, no rubs, murmurs or gallops Pulm: clear to auscultation bilaterally, moving normal volumes of air Abd: soft, BS present, large 10-15cm midline abdominal incision with protruding hernia that is difficult to reduce, open areas with bloody serosanginous drainage Ext: warm and well perfused, no pedal edema, L bka Neuro: alert and oriented X3, cranial nerves II-XII grossly intact  Assessment & Plan:  1. Non healing surgical wound: pt has very large draining abdominal surgical wound s/p emergent surgery. Pt has had poor healing of wound and f/u with gen surgery. Pt still having a lot of pain.  -Pain contract signed for Percocet 10-325mg  q6h 120 tablets per month -f/u with pt after surgical appt to reasses  Pt discussed with Dr. Dalphine Handing

## 2012-10-03 NOTE — Telephone Encounter (Signed)
No answer on home phone. LMOM for patient on her cell to make her aware RX written and at the front for pick up.

## 2012-10-03 NOTE — Telephone Encounter (Signed)
I called the pt to let her know the Tramadol was refilled and faxed.  She says she had wanted the Percocet refilled.  She needs it for the hernia pain.  She made an appointment for 5/15.  I told her we can ask [name] when he is here this pm at 1:30pm.  She won't be at home and she will call back at 3pm to check on the status of that.

## 2012-10-03 NOTE — Progress Notes (Signed)
This office note has been dictated.  #[id number]

## 2012-10-06 ENCOUNTER — Ambulatory Visit: Payer: Medicare Other | Admitting: Oncology

## 2012-10-06 ENCOUNTER — Other Ambulatory Visit: Payer: Medicare Other | Admitting: Lab

## 2012-10-06 LAB — AFP TUMOR MARKER: AFP-Tumor Marker: 27.6 ng/mL — ABNORMAL HIGH (ref 0.0–8.0)

## 2012-10-06 LAB — IRON AND TIBC
Iron: 27 ug/dL — ABNORMAL LOW (ref 42–145)
TIBC: 434 ug/dL (ref 250–470)
UIBC: 407 ug/dL — ABNORMAL HIGH (ref 125–400)

## 2012-10-06 LAB — FOLATE RBC: RBC Folate: 890 ng/mL (ref >=366)

## 2012-10-06 NOTE — Progress Notes (Signed)
INTERNAL MEDICINE TEACHING ATTENDING ADDENDUM: I discussed this case with Dr. Burtis Junes soon after the patient visit. This patient had abdominal surgery done in April and will be followed up at Dr. Eliberto Ivory office on 10/09/12 for the same. We will in the interim provide for pain control. I have read the documentation by Dr. Burtis Junes, and I agree with the plan of care at this time. Please see the resident note for details of management.

## 2012-10-07 ENCOUNTER — Other Ambulatory Visit: Payer: Self-pay | Admitting: Oncology

## 2012-10-07 NOTE — Progress Notes (Signed)
CC:   [name], MD, [facility] [name], MD Abigail Miyamoto, M.D.   PROBLEM LIST:  1. Recurrent iron-deficiency anemia secondary to  gastrointestinal bleeding. The patient has been heavily dependent upon  IV Feraheme. Over the past year or so she has been receiving about 1  Feraheme infusion monthly on average. The patient's most recent infusions  of IV Feraheme 510 mg occurred on the following dates: 06/05/2011, 07/03/2011, 07/31/2011, 08/17/2011, 08/28/2011. The patient received IV Feraheme 1020 mg on 10/02/2011 01/30/2012, 06/06/2012 and 09/10/2012.   Ms. Gazzola also has required red cell transfusions in the past, most  recently early December 2012. She underwent a small bowel enteroscopy  by Dr. Tilford Pillar at [facility]  on 08/13/2011. The enteroscope was passed up to 6 feet into the  jejunum. He saw multiple AVMs, which were ablated. Two of the large  AVMs bled during APC. These were in the 4th part of the duodenum very  close to the ligament of Treitz. The remainder of the AVMs were in the  proximal jejunum. In addition to the small bowel AV malformations,  which were treated with APC, he saw some small esophageal varices which  were not bleeding and also evidence of mild portal hypertensive  gastropathy.  2. History of duodenal arteriovenous malformation.  3. History of hepatitis C infection.  4. Gastroesophageal reflux disease.  5. Depression.  6. Hypertension.  7. History of right Bell's palsy.  8. 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  9. Neurodermatitis.  10. Previous history of alcohol usage.  11. Admission to the hospital from 12/20/2011 through 12/25/2011 for  hematochezia felt to be secondary to a diverticular bleed,  requiring 2 units of packed red cells.  12. Diverticulosis.  13. Elevated alpha fetoprotein, 72.3 on 11/19/2011 and 76.0 on 01/11/2012.   14. Abnormal MRI of the liver from 11/28/2011 showing diffuse and  markedly low signal intensity throughout the liver as well as  diffuse low signal intensity in the adrenal glands. 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  There were no findings for cirrhosis, portal hypertension,  splenomegaly or ascites, not were there any worrisome enhancing  liver lesions.  15. Systolic ejection murmur.  16. A partial left colectomy carried out on 02/15/2012 as elective     surgery for a history of diverticular bleeding.  The patient's     course was complicated by postoperative bleeding from the suture     line.  Patient required numerous blood transfusions.  Admission was     from 02/15/2012 through 02/29/2012.  The patient then had     dehiscence following discharge and went to the emergency room on     03/05/2012.  She had a wound VAC.  She then required readmission     for encephalopathy, felt to be related to medicines from 03/27/2012     through 04/01/2012.  Thereafter, the patient went to [facility], where she stayed for 30 days.  17. Postoperative abdominal wall hernia.   MEDICATIONS:  Reviewed and recorded. Current Outpatient Prescriptions  Medication Sig Dispense Refill  . aspirin EC 81 MG tablet Take 2 tablets (162 mg total) by mouth 2 (two) times daily.  31 tablet  1  . atenolol (TENORMIN) 50 MG tablet Take 1 tablet (50 mg total) by mouth every morning.  30 tablet  3  . cetirizine (ZYRTEC) 10  MG tablet Take 1 tablet (10 mg total) by mouth daily.  30 tablet  1  . FLUoxetine (PROZAC) 20 MG capsule Take 1 capsule (20 mg total) by mouth every morning.  30 capsule  3  . gabapentin (NEURONTIN) 400 MG capsule Take 2 capsules (800 mg total) by mouth 3 (three) times daily.  90 capsule  3  . hydrochlorothiazide (HYDRODIURIL) 25 MG tablet Take 1 tablet (25 mg total) by mouth daily.  30 tablet  3  . HYDROcodone-acetaminophen (NORCO/VICODIN)  5-325 MG per tablet Take 1 tablet by mouth every 6 (six) hours as needed for pain.  60 tablet  1  . lisinopril (PRINIVIL,ZESTRIL) 40 MG tablet Take 1 tablet (40 mg total) by mouth every morning.  30 tablet  3  . traMADol (ULTRAM) 50 MG tablet Take 2 tablets (100 mg total) by mouth every 12 (twelve) hours as needed. For pain.  65 tablet  1  . traZODone (DESYREL) 100 MG tablet Take 1 tablet (100 mg total) by mouth at bedtime.  30 tablet  1  . vitamin C (ASCORBIC ACID) 500 MG tablet Take 1 tablet (500 mg total) by mouth daily.  30 tablet  12  . esomeprazole (NEXIUM) 40 MG capsule Take 1 capsule (40 mg total) by mouth daily before breakfast.  30 capsule  12  . oxyCODONE-acetaminophen (PERCOCET) 10-325 MG per tablet Take 1 tablet by mouth every 6 (six) hours as needed for pain.  120 tablet  0  . potassium chloride 20 MEQ/15ML (10%) SOLN Take 15 mLs (20 mEq total) by mouth daily.  450 mL  1   No current facility-administered medications for this visit.    TREATMENT PROGRAM: -IV Feraheme as indicated. IV Feraheme 1020 mg was administered on 10/02/2011, 01/30/2012, 06/06/2012 and 09/10/2012.   SMOKING HISTORY: The patient smoked up to a pack and a half of  cigarettes a day from age 60 and continues to smoke a couple of  cigarettes a day at the present time.   HISTORY:  Siarah Deleo was seen today for followup of her recurrent iron-deficiency anemia due to GI bleeding.  GI bleeding is probably due to AV malformations and diverticulosis.  The patient was last seen by [place] on 06/02/2012 and prior to that on 01/11/2012.  We have been following her CBC and ferritin on a monthly basis except for the time when she was hospitalized.  Since August 2013, the patient required IV Feraheme 1020 mg on 01/30/2012, 06/06/2012, and 09/10/2012.  The patient is here today with her caregiver, Emelia Salisbury, who is employed by [employer].  The patient apparently has been under their supervision since 2012.  Her  caregiver sees the patient 5 days a week, Monday through Friday.  The patient is here in a wheelchair but gets around at home with a walker.  She lives alone.  She has undergone a left above-the-knee amputation due to necrotizing fasciitis in October 2010.  The patient still has a slight open area on her abdomen.  She has an enlarging abdominal hernia that may require surgery.  She apparently has done fairly well over the past few months since she got home from her stay at the rehab center.  The patient denies any pagophagia or obvious blood in her stools.  She does intermittently see black stools, most recently earlier this week.  She is without any specific complaints today.  PHYSICAL EXAM:  The patient is in a wheelchair.  Weight is stable at 176 pounds 12.8  ounces.  Height 5 feet 2 inches.  Body surface area 1.87 meters squared.  Blood pressure 148/83.  The patient is afebrile.  Other vital signs are normal.  There is no scleral icterus.  Mouth and pharynx are benign.  There is no peripheral adenopathy palpable.  Lungs:  Clear to percussion and auscultation.  Cardiac:  Regular rhythm with systolic ejection murmur.  Abdomen is postsurgical.  Abdomen healed by secondary intention.  There is one linear area that is open.  There are no bandages.  There is what appears to be extensive abdominal hernia or also an umbilical hernia.  No organomegaly or masses.  Extremities:  No peripheral edema.  The patient has an above-the-knee amputation on the left.  Is wearing her prosthesis.  Neuro:  Dermatitis has improved.  No obvious asterixis or muscle twitches.  The patient seems to be appropriate.  LABORATORY DATA:  White count today 4.8, ANC 2.4, hemoglobin 10.5, hematocrit 32.1, platelets 279,000.  LDH is 162.  Chemistries and iron studies today are pending.  On 09/01/2012, ferritin was 66.  The patient did receive IV Feraheme 1020 mg on 09/10/2012.   IMAGING STUDIES:  1. Screening  bilateral mammograms from 01/19/2011 showed  calcifications and possible mass involving the left breast.  Additional studies were recommended. Diagnostic limited mammogram  of the left breast from 02/20/2011 showed no persistent worrisome  abnormality upon additional imaging of the left breast. Yearly  screening mammography was suggested.  2. Chest x-ray, 2 view, from 05/04/2011 showed lingular scarring  without active disease.  3. MRI of the abdomen with and without IV contrast from 11/28/2011  showed changes consistent with secondary or transfusional  hemosiderosis involving the liver, spleen, adrenal glands and bone  marrow. There were no findings of cirrhosis, portal hypertension,  splenomegaly or ascites and no worrisome enhancing liver lesions.  Gallbladder was normal. There was no common bile duct dilatation,  and the pancreatic duct was normal.  4. Nuclear medicine gastrointestinal bleeding study from 12/20/2011  showed increased radiotracer uptake within the proximal small bowel  loops which may be compatible with acute proximal small bowel  hemorrhage. Differential diagnosis included gastric ulcer or  erosive gastritis.  5. CT scan of the abdomen and pelvis with IV contrast from 12/23/2011  showed a few diverticula noted in the left colon. There was trace  fluid and mild prominent-sized colonic diverticulum in the distal  left colon in the left lower abdomen and mild thickening of the  colonic wall in this region. There was no pericolonic abscess  noted. No hydronephrosis or hydroureter. 6. Digital bilateral screening mammogram on 04/15/2012 was negative. 7. CT scan of abdomen and pelvis with IV contrast on 08/21/2012 shows     no acute findings.  Since 12/23/2011 there has been the interval     development of marked laxity of the ventral abdominal wall with     ventral protrusion of the large and small bowel loops.  PROCEDURES:  1. The patient underwent upper endoscopy on  12/20/2011 with negative  findings.  2. Colonoscopy carried out by Dr. Jeani Hawking on 12/21/2011 disclosed  mild diverticulosis with findings felt to be consistent with a left-  sided diverticular bleed. There were also small hemorrhoids.     IMPRESSION/PLAN: 1. Ms. Pizzimenti appears to be stable at the present time.  She     certainly had a rocky course following surgery towards the end of     2013.  Unfortunately she still seems to  be bleeding, as she has     required iron infusions, most recently 09/10/2012 and on     06/06/2012.  We await iron studies today. 2. We will continue to support this patient as best we can with IV     iron.  We are continuing to check a CBC and ferritin every month.     As stated, iron studies today are pending.  We will plan to see Ms.     Goldsmith again in 4 months, at which time we will check CBC,     chemistries, and iron studies. 3. I should mention that this patient did have a ceruloplasmin level     checked on 01/11/2012.  This came back normal with a value of 26     with normal being 20-60. 4. Extensive records from the patient's 2 admissions late last year     were reviewed.    ______________________________ Samul Dada, M.D. DSM/MEDQ  D:  10/03/2012  T:  10/03/2012  Job:  [id number]

## 2012-10-07 NOTE — Telephone Encounter (Signed)
Rx faxed in.

## 2012-10-08 ENCOUNTER — Encounter: Payer: Self-pay | Admitting: Oncology

## 2012-10-08 ENCOUNTER — Other Ambulatory Visit: Payer: Self-pay | Admitting: Oncology

## 2012-10-08 NOTE — Progress Notes (Signed)
This patient has been scheduled for IV Feraheme 1020 mg on or about 10/14/2012.  On 10/03/2012 ferritin was 110, hemoglobin 10.5 and hematocrit 32.1.  The patient had received IV Feraheme 1020 mg IV on 09/10/2012 after ferritin on 09/01/2012 had decreased to 66.

## 2012-10-09 ENCOUNTER — Other Ambulatory Visit: Payer: Self-pay

## 2012-10-09 ENCOUNTER — Ambulatory Visit (INDEPENDENT_AMBULATORY_CARE_PROVIDER_SITE_OTHER): Payer: Medicare Other | Admitting: Surgery

## 2012-10-09 VITALS — BP 182/94 | HR 78 | Temp 99.1°F | Resp 18 | Ht 62.0 in | Wt 174.0 lb

## 2012-10-09 DIAGNOSIS — K432 Incisional hernia without obstruction or gangrene: Secondary | ICD-10-CM

## 2012-10-09 NOTE — Progress Notes (Signed)
Subjective:     Patient ID: [name], female   DOB: [birth year], 62 y.o.   MRN: [id number]  HPI She is here for another visit. She remains asymptomatic from the incisional hernia.  Review of Systems     Objective:   Physical Exam On exam, there is a reducible incisional hernia. There is occasional skin breakdown at the old incision    Assessment:     Incisional hernia     Plan:     Given her comorbidities and limited activity, we will continue to hold a repair unless the hernia becomes symptomatic. I will see her back in 6 months

## 2012-10-10 ENCOUNTER — Telehealth: Payer: Self-pay | Admitting: Medical Oncology

## 2012-10-10 NOTE — Telephone Encounter (Signed)
I called pt to inform her her ferritin is low and Dr. Arline Asp has ordered her to get feraheme infusion. I informed her that the schedulers will call her with a date and time.

## 2012-10-10 NOTE — Telephone Encounter (Signed)
Per staff message and POF I have scheduled appts.  JMW  

## 2012-10-11 ENCOUNTER — Emergency Department (HOSPITAL_COMMUNITY): Payer: Medicare Other

## 2012-10-11 ENCOUNTER — Encounter (HOSPITAL_COMMUNITY): Payer: Self-pay | Admitting: Emergency Medicine

## 2012-10-11 ENCOUNTER — Emergency Department (HOSPITAL_COMMUNITY)
Admission: EM | Admit: 2012-10-11 | Discharge: 2012-10-11 | Disposition: A | Discharge status: Home / Self Care | Payer: Medicare Other | Attending: Emergency Medicine | Admitting: Emergency Medicine

## 2012-10-11 DIAGNOSIS — Y9389 Activity, other specified: Secondary | ICD-10-CM | POA: Insufficient documentation

## 2012-10-11 DIAGNOSIS — S99919A Unspecified injury of unspecified ankle, initial encounter: Secondary | ICD-10-CM | POA: Diagnosis not present

## 2012-10-11 DIAGNOSIS — Z79899 Other long term (current) drug therapy: Secondary | ICD-10-CM | POA: Diagnosis not present

## 2012-10-11 DIAGNOSIS — Z860100 Personal history of colon polyps, unspecified: Secondary | ICD-10-CM | POA: Insufficient documentation

## 2012-10-11 DIAGNOSIS — Z8673 Personal history of transient ischemic attack (TIA), and cerebral infarction without residual deficits: Secondary | ICD-10-CM | POA: Insufficient documentation

## 2012-10-11 DIAGNOSIS — Z7982 Long term (current) use of aspirin: Secondary | ICD-10-CM | POA: Insufficient documentation

## 2012-10-11 DIAGNOSIS — Z880 Allergy status to penicillin: Secondary | ICD-10-CM | POA: Insufficient documentation

## 2012-10-11 DIAGNOSIS — K219 Gastro-esophageal reflux disease without esophagitis: Secondary | ICD-10-CM | POA: Insufficient documentation

## 2012-10-11 DIAGNOSIS — Z993 Dependence on wheelchair: Secondary | ICD-10-CM | POA: Insufficient documentation

## 2012-10-11 DIAGNOSIS — F17200 Nicotine dependence, unspecified, uncomplicated: Secondary | ICD-10-CM | POA: Insufficient documentation

## 2012-10-11 DIAGNOSIS — Z8709 Personal history of other diseases of the respiratory system: Secondary | ICD-10-CM | POA: Insufficient documentation

## 2012-10-11 DIAGNOSIS — Y9289 Other specified places as the place of occurrence of the external cause: Secondary | ICD-10-CM | POA: Insufficient documentation

## 2012-10-11 DIAGNOSIS — Z8719 Personal history of other diseases of the digestive system: Secondary | ICD-10-CM | POA: Insufficient documentation

## 2012-10-11 DIAGNOSIS — Z862 Personal history of diseases of the blood and blood-forming organs and certain disorders involving the immune mechanism: Secondary | ICD-10-CM | POA: Diagnosis not present

## 2012-10-11 DIAGNOSIS — S78119A Complete traumatic amputation at level between unspecified hip and knee, initial encounter: Secondary | ICD-10-CM | POA: Diagnosis not present

## 2012-10-11 DIAGNOSIS — I10 Essential (primary) hypertension: Secondary | ICD-10-CM | POA: Insufficient documentation

## 2012-10-11 DIAGNOSIS — Z8639 Personal history of other endocrine, nutritional and metabolic disease: Secondary | ICD-10-CM | POA: Insufficient documentation

## 2012-10-11 DIAGNOSIS — W010XXA Fall on same level from slipping, tripping and stumbling without subsequent striking against object, initial encounter: Secondary | ICD-10-CM | POA: Insufficient documentation

## 2012-10-11 DIAGNOSIS — S8990XA Unspecified injury of unspecified lower leg, initial encounter: Secondary | ICD-10-CM | POA: Diagnosis not present

## 2012-10-11 DIAGNOSIS — G479 Sleep disorder, unspecified: Secondary | ICD-10-CM | POA: Diagnosis not present

## 2012-10-11 DIAGNOSIS — M25561 Pain in right knee: Secondary | ICD-10-CM

## 2012-10-11 DIAGNOSIS — X500XXA Overexertion from strenuous movement or load, initial encounter: Secondary | ICD-10-CM | POA: Insufficient documentation

## 2012-10-11 DIAGNOSIS — I739 Peripheral vascular disease, unspecified: Secondary | ICD-10-CM | POA: Insufficient documentation

## 2012-10-11 DIAGNOSIS — M25569 Pain in unspecified knee: Secondary | ICD-10-CM | POA: Diagnosis not present

## 2012-10-11 DIAGNOSIS — R52 Pain, unspecified: Secondary | ICD-10-CM | POA: Diagnosis not present

## 2012-10-11 MED ORDER — OXYCODONE-ACETAMINOPHEN 5-325 MG PO TABS: 1.0000 | ORAL_TABLET | ORAL | Status: DC | PRN

## 2012-10-11 MED ORDER — ACETAMINOPHEN 500 MG PO TABS
1000.0000 mg | ORAL_TABLET | Freq: Once | ORAL | Status: AC
  Administered 2012-10-11: 1000 mg via ORAL
  Filled 2012-10-11: qty 2

## 2012-10-11 NOTE — ED Provider Notes (Signed)
History     CSN: [id number]  Arrival date & time 10/11/12  1059   First MD Initiated Contact with Patient 10/11/12 1106      Chief Complaint  Patient presents with  . Fall    (Consider location/radiation/quality/duration/timing/severity/associated sxs/prior treatment) HPI  Pt is a 62 yo F PMHx significant for left AKA presenting for right knee pain that occurred after patient slipped getting out of her wheelchair trying to go to the bathroom this morning. Patient states she hit her knee and heard a pop and had immediate 10/10 sharp non-radiating pain. Received Tylenol in ambulance which reduced her pain from 10 to 7. She was unable to pull herself up d/t pain. She states she also hit her right shoulder, but denies hitting her head or LOC. Denies SOB, CP, headache, N/V, neurofocal deficits.   Past Medical History  Diagnosis Date  . Hypertension   . Colon polyps   . Stomach problems   . Hypokalemic alkalosis   . Allergic rhinitis   . GERD (gastroesophageal reflux disease)   . Stroke 2010    no residual problems  . Shortness of breath     due to anemia  . Anemia, iron deficiency 05/03/2011    recieves iron infusions  . Anemia   . GI bleeding   . Diverticulitis   . Transfusion history   . Difficulty sleeping     takes trazadone for sleep  . Peripheral vascular disease     Past Surgical History  Procedure Laterality Date  . Leg amputation above knee    . Esophagogastroduodenoscopy  05/05/2011    Procedure: ESOPHAGOGASTRODUODENOSCOPY (EGD);  Surgeon: Erick Blinks, MD;  Location: [facility];  Service: Gastroenterology;  Laterality: N/A;  Dr. Rhea Belton will do procedure for Dr. [name] Saturday.  . Esophagogastroduodenoscopy  05/08/2011    Procedure: ESOPHAGOGASTRODUODENOSCOPY (EGD);  Surgeon: Theda Belfast;  Location: [facility];  Service: Endoscopy;  Laterality: N/A;  . Esophagogastroduodenoscopy  06/07/2011    Procedure: ESOPHAGOGASTRODUODENOSCOPY (EGD);  Surgeon: Theda Belfast, MD;  Location: [facility];  Service: Endoscopy;  Laterality: N/A;  . Hot hemostasis  06/07/2011    Procedure: HOT HEMOSTASIS (ARGON PLASMA COAGULATION/BICAP);  Surgeon: Theda Belfast, MD;  Location: [facility];  Service: Endoscopy;  Laterality: N/A;  . Esophagogastroduodenoscopy  12/20/2011    Procedure: ESOPHAGOGASTRODUODENOSCOPY (EGD);  Surgeon: Theda Belfast, MD;  Location: [facility];  Service: Endoscopy;  Laterality: N/A;  . Flexible sigmoidoscopy  12/21/2011    Procedure: FLEXIBLE SIGMOIDOSCOPY;  Surgeon: Theda Belfast, MD;  Location: [facility];  Service: Endoscopy;  Laterality: N/A;  . Carpal tunnel release      rt hand  . Partial colectomy  02/15/2012    Procedure: PARTIAL COLECTOMY;  Surgeon: Shelly Rubenstein, MD;  Location: [facility];  Service: General;  Laterality: N/A;  . Laparotomy  02/16/2012    Procedure: EXPLORATORY LAPAROTOMY;  Surgeon: Mariella Saa, MD;  Location: [facility];  Service: General;  Laterality: N/A;  oversewing of anastomotic leak and rigid sigmoidoscopy    Family History  Problem Relation Age of Onset  . Cancer Father     unknown  . Hypertension Mother   . Diverticulitis Mother     History  Substance Use Topics  . Smoking status: Current Every Day Smoker -- 0.20 packs/day    Types: Cigarettes  . Smokeless tobacco: Never Used     Comment: wants to quit  . Alcohol Use: Yes     Comment: Occasionally  OB History   Grav Para Term Preterm Abortions TAB SAB Ect Mult Living                  Review of Systems  Constitutional: Negative for fever and chills.  HENT: Negative for facial swelling, neck pain and neck stiffness.   Eyes: Negative for pain and visual disturbance.  Respiratory: Negative for shortness of breath.   Cardiovascular: Negative for chest pain and palpitations.  Gastrointestinal: Negative for nausea, vomiting, abdominal pain, blood in stool and abdominal distention.  Genitourinary: Negative.   Musculoskeletal:  Negative for back pain.  Skin: Negative.   Neurological: Negative for dizziness, weakness, light-headedness, numbness and headaches.    Allergies  Ciprofloxacin; Morphine and related; Penicillins; and Codeine  Home Medications   Current Outpatient Rx  Name  Route  Sig  Dispense  Refill  . aspirin EC 81 MG tablet   Oral   Take 2 tablets (162 mg total) by mouth 2 (two) times daily.   31 tablet   1   . atenolol (TENORMIN) 50 MG tablet   Oral   Take 1 tablet (50 mg total) by mouth every morning.   30 tablet   3   . cetirizine (ZYRTEC) 10 MG tablet   Oral   Take 1 tablet (10 mg total) by mouth daily.   30 tablet   1   . esomeprazole (NEXIUM) 40 MG capsule   Oral   Take 1 capsule (40 mg total) by mouth daily before breakfast.   30 capsule   12   . FLUoxetine (PROZAC) 20 MG capsule   Oral   Take 1 capsule (20 mg total) by mouth every morning.   30 capsule   3   . gabapentin (NEURONTIN) 400 MG capsule   Oral   Take 2 capsules (800 mg total) by mouth 3 (three) times daily.   90 capsule   3   . hydrochlorothiazide (HYDRODIURIL) 25 MG tablet   Oral   Take 1 tablet (25 mg total) by mouth daily.   30 tablet   3   . HYDROcodone-acetaminophen (NORCO/VICODIN) 5-325 MG per tablet   Oral   Take 1 tablet by mouth every 6 (six) hours as needed for pain.   60 tablet   1   . lisinopril (PRINIVIL,ZESTRIL) 40 MG tablet   Oral   Take 1 tablet (40 mg total) by mouth every morning.   30 tablet   3   . oxyCODONE-acetaminophen (PERCOCET) 10-325 MG per tablet   Oral   Take 1 tablet by mouth every 6 (six) hours as needed for pain.   120 tablet   0   . oxyCODONE-acetaminophen (PERCOCET/ROXICET) 5-325 MG per tablet   Oral   Take 1-2 tablets by mouth every 4 (four) hours as needed for pain.   15 tablet   0   . potassium chloride 20 MEQ/15ML (10%) SOLN   Oral   Take 15 mLs (20 mEq total) by mouth daily.   450 mL   1   . Potassium Chloride Crys CR (KLOR-CON M20 PO)    Oral   Take by mouth.         Marland Kitchen Specialty Vitamins Products (MAGNESIUM, AMINO ACID CHELATE,) 133 MG tablet   Oral   Take 1 tablet by mouth 2 (two) times daily.         . traMADol (ULTRAM) 50 MG tablet   Oral   Take 2 tablets (100 mg total) by mouth every  12 (twelve) hours as needed. For pain.   65 tablet   1   . traZODone (DESYREL) 100 MG tablet   Oral   Take 1 tablet (100 mg total) by mouth at bedtime.   30 tablet   1   . vitamin C (ASCORBIC ACID) 500 MG tablet   Oral   Take 1 tablet (500 mg total) by mouth daily.   30 tablet   12     BP 117/83  Pulse 69  Temp(Src) 98.3 F (36.8 C) (Oral)  Resp 16  SpO2 96%  Physical Exam  Constitutional: She is oriented to person, place, and time. She appears well-developed and well-nourished.  HENT:  Head: Normocephalic and atraumatic.  Eyes: EOM are normal. Pupils are equal, round, and reactive to light.  Neck: Normal range of motion. Neck supple.  Cardiovascular: Normal rate, regular rhythm and normal heart sounds.   Pulmonary/Chest: Effort normal and breath sounds normal.  Abdominal: Soft. Bowel sounds are normal. There is no tenderness.  Musculoskeletal:       Right knee: She exhibits decreased range of motion, swelling and bony tenderness. She exhibits no deformity and no erythema. Tenderness found. Medial joint line, lateral joint line and patellar tendon tenderness noted.  Bruise over right patella Left AKA Unable to perform Apley Grind Test, Anterior Drawer, Posterior Drawer d/t pain and swelling. Unable to apply valgus or varus stress d/t pain and swelling.   Neurological: She is alert and oriented to person, place, and time. No cranial nerve deficit or sensory deficit. GCS eye subscore is 4. GCS verbal subscore is 5. GCS motor subscore is 6.  Unable to ambulate d/t left AKA  Skin: Skin is warm and dry.  Psychiatric: She has a normal mood and affect.    ED Course  Procedures (including critical care time)    Date: 10/11/2012  Rate: 66  Rhythm: sinus rhythm  QRS Axis: normal  Intervals: normal  ST/T Wave abnormalities: normal  Conduction Disutrbances:first-degree A-V block   Narrative Interpretation:   Old EKG Reviewed: none available    Labs Reviewed - No data to display Dg Knee Complete 4 Views Right  10/11/2012   *RADIOLOGY REPORT*  Clinical Data: Fall with right knee pain.  RIGHT KNEE - COMPLETE 4+ VIEW  Comparison: MRI 08/04/2007  Findings: Moderate medial and mild lateral compartment osteoarthritis, with joint space narrowing and osteophyte formation.  Mild to moderate patellofemoral osteoarthritis. No joint effusion.  No acute fracture or dislocation.  IMPRESSION: Osteoarthritis, without acute osseous finding.   Original Report Authenticated By: Jeronimo Greaves, M.D.     1. Knee pain, acute, right       MDM  Pt with mechanical fall onto knee. Denies hitting head, LOC, nausea, vomiting, HA. No neurofocal deficits. No evidence of head trauma. No concern for head injury. Imaging shows no fracture. Directed pt to ice injury, take pain medication for pain and ibuprofen to help with swelling, and to elevate and rest the injury when possible. Applied ace wrap for support and comfort. Advised to continue using her wheelchair and to follow up with her PCP. Patient agreeable to plan. Patient d/w with Dr. Blinda Leatherwood, agrees with plan. Patient is stable at time of discharge          Jeannetta Ellis, PA-C 10/11/12 1610

## 2012-10-11 NOTE — ED Notes (Signed)
Pt to ED via EMS for fall. Pt states slipped out a wheelchair and fall while going to bathroom. Pt is left below the knee amputated. Pt states she heart right knee popped and she reports severe pain at the knee. Right knee immobilized by EMS. Pt given 1000mg  tylenol, reduced pain from 10/10 to 7/10. Per EMS BP-140/90, NSR-90.

## 2012-10-12 NOTE — ED Provider Notes (Signed)
Medical screening examination/treatment/procedure(s) were performed by non-physician practitioner and as supervising physician I was immediately available for consultation/collaboration.    Gilda Crease, MD 10/12/12 [id number]

## 2012-10-13 ENCOUNTER — Telehealth: Payer: Self-pay | Admitting: *Deleted

## 2012-10-13 NOTE — Telephone Encounter (Signed)
Per staff message and POF I have scheduled appts.  [name]  

## 2012-10-14 ENCOUNTER — Ambulatory Visit: Payer: Medicare Other

## 2012-10-14 ENCOUNTER — Telehealth: Payer: Self-pay | Admitting: Oncology

## 2012-10-14 ENCOUNTER — Telehealth: Payer: Self-pay | Admitting: *Deleted

## 2012-10-14 NOTE — Telephone Encounter (Signed)
Patient called back and needs to move her appt to the morning. Patient transportation can only come in the morning. Appt moved and patient notified.  JMW

## 2012-10-15 ENCOUNTER — Ambulatory Visit (HOSPITAL_BASED_OUTPATIENT_CLINIC_OR_DEPARTMENT_OTHER): Payer: Medicare Other

## 2012-10-15 VITALS — BP 170/90 | HR 72 | Temp 98.7°F | Resp 18

## 2012-10-15 DIAGNOSIS — D509 Iron deficiency anemia, unspecified: Secondary | ICD-10-CM | POA: Diagnosis not present

## 2012-10-15 DIAGNOSIS — D500 Iron deficiency anemia secondary to blood loss (chronic): Secondary | ICD-10-CM

## 2012-10-15 MED ORDER — SODIUM CHLORIDE 0.9 % IV SOLN
Freq: Once | INTRAVENOUS | Status: AC
  Administered 2012-10-15: 10:00:00 via INTRAVENOUS

## 2012-10-15 MED ORDER — SODIUM CHLORIDE 0.9 % IV SOLN
1020.0000 mg | Freq: Once | INTRAVENOUS | Status: AC
  Administered 2012-10-15: 1020 mg via INTRAVENOUS
  Filled 2012-10-15: qty 34

## 2012-10-15 NOTE — Patient Instructions (Signed)
Ferumoxytol injection What is this medicine? FERUMOXYTOL is an iron complex. Iron is used to make healthy red blood cells, which carry oxygen and nutrients throughout the body. This medicine is used to treat iron deficiency anemia in people with chronic kidney disease. This medicine may be used for other purposes; ask your health care provider or pharmacist if you have questions. What should I tell my health care provider before I take this medicine? They need to know if you have any of these conditions: -anemia not caused by low iron levels -high levels of iron in the blood -magnetic resonance imaging (MRI) test scheduled -an unusual or allergic reaction to iron, other medicines, foods, dyes, or preservatives -pregnant or trying to get pregnant -breast-feeding How should I use this medicine? This medicine is for infusion into a vein. It is given by a health care professional in a hospital or clinic setting. Talk to your pediatrician regarding the use of this medicine in children. Special care may be needed. Overdosage: If you think you've taken too much of this medicine contact a poison control center or emergency room at once. Overdosage: If you think you have taken too much of this medicine contact a poison control center or emergency room at once. NOTE: This medicine is only for you. Do not share this medicine with others. What if I miss a dose? It is important not to miss your dose. Call your doctor or health care professional if you are unable to keep an appointment. What may interact with this medicine? This medicine may interact with the following medications: -other iron products This list may not describe all possible interactions. Give your health care provider a list of all the medicines, herbs, non-prescription drugs, or dietary supplements you use. Also tell them if you smoke, drink alcohol, or use illegal drugs. Some items may interact with your medicine. What should I watch  for while using this medicine? Visit your doctor or healthcare professional regularly. Tell your doctor or healthcare professional if your symptoms do not start to get better or if they get worse. You may need blood work done while you are taking this medicine. You may need to follow a special diet. Talk to your doctor. Foods that contain iron include: whole grains/cereals, dried fruits, beans, or peas, leafy green vegetables, and organ meats (liver, kidney). What side effects may I notice from receiving this medicine? Side effects that you should report to your doctor or health care professional as soon as possible: -allergic reactions like skin rash, itching or hives, swelling of the face, lips, or tongue -breathing problems -changes in blood pressure -feeling faint or lightheaded, falls -fever or chills -flushing, sweating, or hot feelings -swelling of the ankles or feet Side effects that usually do not require medical attention (Report these to your doctor or health care professional if they continue or are bothersome.): -diarrhea -headache -nausea, vomiting -stomach pain This list may not describe all possible side effects. Call your doctor for medical advice about side effects. You may report side effects to FDA at 1-800-FDA-1088. Where should I keep my medicine? This drug is given in a hospital or clinic and will not be stored at home. NOTE: This sheet is a summary. It may not cover all possible information. If you have questions about this medicine, talk to your doctor, pharmacist, or health care provider.  2013, [employer] Standard. (02/04/2008 9:48:25 PM)  

## 2012-10-27 ENCOUNTER — Other Ambulatory Visit: Payer: Self-pay | Admitting: *Deleted

## 2012-10-27 NOTE — Telephone Encounter (Signed)
DUE 11/03/2012

## 2012-10-29 MED ORDER — OXYCODONE-ACETAMINOPHEN 10-325 MG PO TABS: 1.0000 | ORAL_TABLET | Freq: Four times a day (QID) | ORAL | Status: DC | PRN

## 2012-10-31 ENCOUNTER — Other Ambulatory Visit (HOSPITAL_BASED_OUTPATIENT_CLINIC_OR_DEPARTMENT_OTHER): Payer: Medicare Other | Admitting: Lab

## 2012-10-31 DIAGNOSIS — B182 Chronic viral hepatitis C: Secondary | ICD-10-CM | POA: Diagnosis not present

## 2012-10-31 DIAGNOSIS — D509 Iron deficiency anemia, unspecified: Secondary | ICD-10-CM

## 2012-10-31 DIAGNOSIS — D62 Acute posthemorrhagic anemia: Secondary | ICD-10-CM | POA: Diagnosis not present

## 2012-10-31 DIAGNOSIS — K7460 Unspecified cirrhosis of liver: Secondary | ICD-10-CM | POA: Diagnosis not present

## 2012-10-31 LAB — CBC WITH DIFFERENTIAL/PLATELET
BASO%: 0.5 % (ref 0.0–2.0)
Basophils Absolute: 0 10*3/uL (ref 0.0–0.1)
EOS%: 1.6 % (ref 0.0–7.0)
HCT: 30.6 % — ABNORMAL LOW (ref 34.8–46.6)
HGB: 9.6 g/dL — ABNORMAL LOW (ref 11.6–15.9)
LYMPH%: 36.1 % (ref 14.0–49.7)
MCH: 30.7 pg (ref 25.1–34.0)
MCHC: 31.4 g/dL — ABNORMAL LOW (ref 31.5–36.0)
MCV: 97.8 fL (ref 79.5–101.0)
MONO%: 11.5 % (ref 0.0–14.0)
NEUT%: 50.3 % (ref 38.4–76.8)
Platelets: 261 10*3/uL (ref 145–400)

## 2012-11-03 ENCOUNTER — Other Ambulatory Visit: Payer: Self-pay | Admitting: *Deleted

## 2012-11-03 DIAGNOSIS — E876 Hypokalemia: Secondary | ICD-10-CM

## 2012-11-04 ENCOUNTER — Other Ambulatory Visit: Payer: Self-pay | Admitting: Oncology

## 2012-11-05 ENCOUNTER — Telehealth: Payer: Self-pay | Admitting: Medical Oncology

## 2012-11-06 ENCOUNTER — Other Ambulatory Visit: Payer: Self-pay | Admitting: Medical Oncology

## 2012-11-06 ENCOUNTER — Telehealth: Payer: Self-pay | Admitting: Medical Oncology

## 2012-11-06 DIAGNOSIS — D500 Iron deficiency anemia secondary to blood loss (chronic): Secondary | ICD-10-CM

## 2012-11-06 MED ORDER — TRAZODONE HCL 100 MG PO TABS: 100.0000 mg | ORAL_TABLET | Freq: Every day | ORAL | Status: DC

## 2012-11-06 MED ORDER — POTASSIUM CHLORIDE 20 MEQ/15ML (10%) PO SOLN: 20.0000 meq | Freq: Every day | ORAL | Status: DC

## 2012-11-06 MED ORDER — HYDROCODONE-ACETAMINOPHEN 5-325 MG PO TABS: 1.0000 | ORAL_TABLET | Freq: Four times a day (QID) | ORAL | Status: DC | PRN

## 2012-11-06 MED ORDER — CETIRIZINE HCL 10 MG PO TABS: 10.0000 mg | ORAL_TABLET | Freq: Every day | ORAL | Status: DC

## 2012-11-06 NOTE — Telephone Encounter (Signed)
I called pt to let her know that Dr. Arline Asp is concerned with her dropping Hgb and black stools. He is going to set her up for weekly labs starting tomorrow. He also would like for her to get an appt with Dr. [name] to be evaluated.  She voiced understanding. She asked if she can come late tomorrow due to transportation. I told her I will have the schedulers call her.

## 2012-11-06 NOTE — Telephone Encounter (Signed)
I called pt to let her know that her ferritin is normal but her Hgb continues to slowly decline. Dr. Arline Asp would like to know if she has seen any blood.  She states that her stools have been black. I will notify Dr. Arline Asp.

## 2012-11-06 NOTE — Telephone Encounter (Signed)
Called to pharm 

## 2012-11-07 ENCOUNTER — Other Ambulatory Visit: Payer: Self-pay

## 2012-11-07 ENCOUNTER — Telehealth: Payer: Self-pay | Admitting: Oncology

## 2012-11-07 NOTE — Telephone Encounter (Signed)
s.w. pt and advised on 6.16.14 appt and to come pick up up dated schedule

## 2012-11-10 ENCOUNTER — Other Ambulatory Visit (HOSPITAL_BASED_OUTPATIENT_CLINIC_OR_DEPARTMENT_OTHER): Payer: Medicare Other

## 2012-11-10 DIAGNOSIS — D62 Acute posthemorrhagic anemia: Secondary | ICD-10-CM | POA: Diagnosis not present

## 2012-11-10 DIAGNOSIS — K7460 Unspecified cirrhosis of liver: Secondary | ICD-10-CM

## 2012-11-10 DIAGNOSIS — B182 Chronic viral hepatitis C: Secondary | ICD-10-CM

## 2012-11-10 DIAGNOSIS — D509 Iron deficiency anemia, unspecified: Secondary | ICD-10-CM

## 2012-11-10 LAB — CBC WITH DIFFERENTIAL/PLATELET
EOS%: 1.5 % (ref 0.0–7.0)
Eosinophils Absolute: 0.1 10*3/uL (ref 0.0–0.5)
LYMPH%: 43.9 % (ref 14.0–49.7)
MCH: 30.1 pg (ref 25.1–34.0)
MCHC: 31.7 g/dL (ref 31.5–36.0)
MCV: 95 fL (ref 79.5–101.0)
MONO%: 14.3 % — ABNORMAL HIGH (ref 0.0–14.0)
NEUT#: 1.9 10*3/uL (ref 1.5–6.5)
Platelets: 231 10*3/uL (ref 145–400)
RBC: 3.42 10*6/uL — ABNORMAL LOW (ref 3.70–5.45)
RDW: 15.2 % — ABNORMAL HIGH (ref 11.2–14.5)

## 2012-11-10 LAB — FERRITIN: Ferritin: 115 ng/mL (ref 10–291)

## 2012-11-12 ENCOUNTER — Other Ambulatory Visit: Payer: Self-pay | Admitting: Oncology

## 2012-11-12 ENCOUNTER — Encounter: Payer: Self-pay | Admitting: Oncology

## 2012-11-12 NOTE — Progress Notes (Signed)
Labs from 11/10/2012 were as follows:   Hemoglobin 10.3 Hematocrit 32.5 Ferritin 115    This patient received IV Feraheme 1020 mg on 10/15/2012.  Ferritin on 10/03/2012 prior to the iron infusion was 110. On 10/31/2012, ferritin was 405.   I scheduled the patient for IV Feraheme 1020 mg to be given on or about 11/18/2012.   Clearly the patient is continuing to have GI bleeding.

## 2012-11-13 ENCOUNTER — Other Ambulatory Visit: Payer: Self-pay | Admitting: Oncology

## 2012-11-13 ENCOUNTER — Telehealth: Payer: Self-pay | Admitting: *Deleted

## 2012-11-13 ENCOUNTER — Telehealth: Payer: Self-pay | Admitting: Oncology

## 2012-11-13 ENCOUNTER — Telehealth: Payer: Self-pay

## 2012-11-13 NOTE — Telephone Encounter (Signed)
s.w. pt and advised on 6.23.14 appt....pt ok and aware

## 2012-11-13 NOTE — Telephone Encounter (Signed)
Per staff message and POF I have scheduled appts.  JMW  

## 2012-11-13 NOTE — Telephone Encounter (Signed)
S/w pt that Dr Arline Asp has decreased her labs to every 2 weeks instead of weekly. She also verified appt time 6/23 lab and infusion.

## 2012-11-17 ENCOUNTER — Ambulatory Visit (HOSPITAL_BASED_OUTPATIENT_CLINIC_OR_DEPARTMENT_OTHER): Payer: Medicare Other

## 2012-11-17 ENCOUNTER — Other Ambulatory Visit (HOSPITAL_BASED_OUTPATIENT_CLINIC_OR_DEPARTMENT_OTHER): Payer: Medicare Other | Admitting: Lab

## 2012-11-17 ENCOUNTER — Encounter: Payer: Self-pay | Admitting: Oncology

## 2012-11-17 VITALS — BP 167/69 | HR 75 | Temp 98.7°F | Resp 20

## 2012-11-17 DIAGNOSIS — D500 Iron deficiency anemia secondary to blood loss (chronic): Secondary | ICD-10-CM | POA: Diagnosis not present

## 2012-11-17 DIAGNOSIS — D62 Acute posthemorrhagic anemia: Secondary | ICD-10-CM

## 2012-11-17 DIAGNOSIS — D509 Iron deficiency anemia, unspecified: Secondary | ICD-10-CM | POA: Diagnosis not present

## 2012-11-17 DIAGNOSIS — B182 Chronic viral hepatitis C: Secondary | ICD-10-CM | POA: Diagnosis not present

## 2012-11-17 DIAGNOSIS — K7460 Unspecified cirrhosis of liver: Secondary | ICD-10-CM | POA: Diagnosis not present

## 2012-11-17 LAB — CBC WITH DIFFERENTIAL/PLATELET
BASO%: 0.5 % (ref 0.0–2.0)
EOS%: 1.1 % (ref 0.0–7.0)
HCT: 26 % — ABNORMAL LOW (ref 34.8–46.6)
LYMPH%: 31.8 % (ref 14.0–49.7)
MCH: 29.6 pg (ref 25.1–34.0)
MCHC: 32.4 g/dL (ref 31.5–36.0)
NEUT%: 50 % (ref 38.4–76.8)
RBC: 2.85 10*6/uL — ABNORMAL LOW (ref 3.70–5.45)
lymph#: 1.5 10*3/uL (ref 0.9–3.3)

## 2012-11-17 LAB — FERRITIN: Ferritin: 41 ng/mL (ref 10–291)

## 2012-11-17 MED ORDER — METHYLPREDNISOLONE SODIUM SUCC 125 MG IJ SOLR
60.0000 mg | Freq: Once | INTRAMUSCULAR | Status: AC
  Administered 2012-11-17: 17:00:00 via INTRAVENOUS

## 2012-11-17 MED ORDER — DIPHENHYDRAMINE HCL 50 MG/ML IJ SOLN
25.0000 mg | Freq: Once | INTRAMUSCULAR | Status: AC
  Administered 2012-11-17: 25 mg via INTRAVENOUS

## 2012-11-17 MED ORDER — SODIUM CHLORIDE 0.9 % IV SOLN
1020.0000 mg | Freq: Once | INTRAVENOUS | Status: AC
  Administered 2012-11-17: 1020 mg via INTRAVENOUS
  Filled 2012-11-17: qty 34

## 2012-11-17 MED ORDER — FAMOTIDINE IN NACL 20-0.9 MG/50ML-% IV SOLN
20.0000 mg | Freq: Once | INTRAVENOUS | Status: AC
  Administered 2012-11-17: 20 mg via INTRAVENOUS

## 2012-11-17 MED ORDER — SODIUM CHLORIDE 0.9 % IJ SOLN
10.0000 mL | INTRAMUSCULAR | Status: DC | PRN
  Filled 2012-11-17: qty 10

## 2012-11-17 MED ORDER — SODIUM CHLORIDE 0.9 % IJ SOLN
3.0000 mL | Freq: Once | INTRAMUSCULAR | Status: DC | PRN
Start: 2012-11-17 — End: 2012-11-17
  Filled 2012-11-17: qty 10

## 2012-11-17 MED ORDER — SODIUM CHLORIDE 0.9 % IV SOLN
Freq: Once | INTRAVENOUS | Status: AC
  Administered 2012-11-17: 16:00:00 via INTRAVENOUS

## 2012-11-17 MED ORDER — METHYLPREDNISOLONE SODIUM SUCC 40 MG IJ SOLR: 20.0000 mg | Freq: Once | INTRAMUSCULAR | Status: DC

## 2012-11-17 NOTE — Progress Notes (Signed)
This patient has received IV Feraheme 1020 mg on 06/06/2012, 09/10/2012 and 10/15/2012.  Ferritin on 10/31/2012 was 405 but fell to 1:15 on 11/10/2012. Hemoglobin on 11/10/2012 was 10.3 with a hematocrit of 32.5.  The patient was scheduled for IV Feraheme today. CBC today, 11/17/2012, was 8.4 with a hematocrit of 26.0.  The patient has had multiple previous doses of Feraheme without any problems. Today after receiving a very small amount the patient developed intense pruritus involving her right neck region. We did not see an actual rash were hives. The patient did not appear to have any difficulty breathing or abnormal vital signs. The infusion was immediately stopped. The patient received IV Benadryl 50 mg, Pepcid 20 mg and Solu-Medrol 60 mg.  We were hoping to give the patient IV Feraheme after 30-60 minutes when her symptoms subsided. Patient did not want to complete the IV Feraheme infusion today. This will have to be rescheduled.   At this point it appears the patient is having fairly brisk bleeding on the basis of the drop in her hemoglobin hematocrit and ferritin. We will need to get an appointment for her with Dr. Jeani Hawking.  I have ordered monthly IV Feraheme 1020 mg along with the following premeds: Benadryl 50 mg, Pepcid 20 mg and Solu-Medrol 80 mg, all IV.  The patient has standing orders for monthly CBC and ferritin. We probably need to increase this to every 2 weeks with standing orders.

## 2012-11-17 NOTE — Patient Instructions (Addendum)
Ferumoxytol injection What is this medicine? FERUMOXYTOL is an iron complex. Iron is used to make healthy red blood cells, which carry oxygen and nutrients throughout the body. This medicine is used to treat iron deficiency anemia in people with chronic kidney disease. This medicine may be used for other purposes; ask your health care provider or pharmacist if you have questions. What should I tell my health care provider before I take this medicine? They need to know if you have any of these conditions: -anemia not caused by low iron levels -high levels of iron in the blood -magnetic resonance imaging (MRI) test scheduled -an unusual or allergic reaction to iron, other medicines, foods, dyes, or preservatives -pregnant or trying to get pregnant -breast-feeding How should I use this medicine? This medicine is for infusion into a vein. It is given by a health care professional in a hospital or clinic setting. Talk to your pediatrician regarding the use of this medicine in children. Special care may be needed. Overdosage: If you think you've taken too much of this medicine contact a poison control center or emergency room at once. Overdosage: If you think you have taken too much of this medicine contact a poison control center or emergency room at once. NOTE: This medicine is only for you. Do not share this medicine with others. What if I miss a dose? It is important not to miss your dose. Call your doctor or health care professional if you are unable to keep an appointment. What may interact with this medicine? This medicine may interact with the following medications: -other iron products This list may not describe all possible interactions. Give your health care provider a list of all the medicines, herbs, non-prescription drugs, or dietary supplements you use. Also tell them if you smoke, drink alcohol, or use illegal drugs. Some items may interact with your medicine. What should I watch  for while using this medicine? Visit your doctor or healthcare professional regularly. Tell your doctor or healthcare professional if your symptoms do not start to get better or if they get worse. You may need blood work done while you are taking this medicine. You may need to follow a special diet. Talk to your doctor. Foods that contain iron include: whole grains/cereals, dried fruits, beans, or peas, leafy green vegetables, and organ meats (liver, kidney). What side effects may I notice from receiving this medicine? Side effects that you should report to your doctor or health care professional as soon as possible: -allergic reactions like skin rash, itching or hives, swelling of the face, lips, or tongue -breathing problems -changes in blood pressure -feeling faint or lightheaded, falls -fever or chills -flushing, sweating, or hot feelings -swelling of the ankles or feet Side effects that usually do not require medical attention (Report these to your doctor or health care professional if they continue or are bothersome.): -diarrhea -headache -nausea, vomiting -stomach pain This list may not describe all possible side effects. Call your doctor for medical advice about side effects. You may report side effects to FDA at 1-800-FDA-1088. Where should I keep my medicine? This drug is given in a hospital or clinic and will not be stored at home. NOTE: This sheet is a summary. It may not cover all possible information. If you have questions about this medicine, talk to your doctor, pharmacist, or health care provider.  2013, [employer]. (02/04/2008 9:48:25 PM)  

## 2012-11-17 NOTE — Progress Notes (Signed)
1637 Patient complains of heat on right side of neck accompanied by itching, no hives noted. Patient denies SOB or difficulty swallowing. Feraheme stopped normal saline wide open, 25 mg Benadryl given IV, Dr. Arline Asp notified.  1642 Dr. Arline Asp at chairside. Order given and carried out for Famotidine 20 mg IV, and 60 mg Solu-Medrol IV. VSS.  1645 patient states itching improving 1700 Order given and carried out for 25mg  more Benadryl IV. Pt to be rechallenged when itching fully resolved. 1715 Pt does not want to complete Fereheme infusion today. Pt will be rescheduled this week.

## 2012-11-18 ENCOUNTER — Telehealth: Payer: Self-pay | Admitting: Medical Oncology

## 2012-11-18 ENCOUNTER — Other Ambulatory Visit: Payer: Self-pay | Admitting: Medical Oncology

## 2012-11-18 ENCOUNTER — Telehealth: Payer: Self-pay | Admitting: *Deleted

## 2012-11-18 ENCOUNTER — Telehealth: Payer: Self-pay | Admitting: Oncology

## 2012-11-18 NOTE — Telephone Encounter (Signed)
Per staff message and POF I have scheduled appts.  JMW  

## 2012-11-18 NOTE — Telephone Encounter (Signed)
I called Dr. Haywood Pao office to get pt an appointment for her GI bleeding. Appointmentt for July 7 at 830 am. I spoke with pt and she voiced appt date and time back to me. I also let her know that Dr. Arline Asp is ok with her coming in Thurs am or Friday am to get feraheme. Thurston Hole will give her a call. Dr. Arline Asp notified of appointment with Dr. Arline Asp.

## 2012-11-18 NOTE — Telephone Encounter (Signed)
Per staff phone call and POF I have schedueld appts.  JMW  

## 2012-11-20 ENCOUNTER — Ambulatory Visit (HOSPITAL_BASED_OUTPATIENT_CLINIC_OR_DEPARTMENT_OTHER): Payer: Medicare Other

## 2012-11-20 VITALS — BP 157/83 | HR 70 | Temp 98.4°F | Resp 20

## 2012-11-20 DIAGNOSIS — D509 Iron deficiency anemia, unspecified: Secondary | ICD-10-CM | POA: Diagnosis not present

## 2012-11-20 DIAGNOSIS — D500 Iron deficiency anemia secondary to blood loss (chronic): Secondary | ICD-10-CM

## 2012-11-20 MED ORDER — DIPHENHYDRAMINE HCL 50 MG/ML IJ SOLN
50.0000 mg | Freq: Once | INTRAMUSCULAR | Status: AC
  Administered 2012-11-20: 50 mg via INTRAVENOUS

## 2012-11-20 MED ORDER — METHYLPREDNISOLONE SODIUM SUCC 125 MG IJ SOLR
80.0000 mg | Freq: Once | INTRAMUSCULAR | Status: AC
  Administered 2012-11-20: 80 mg via INTRAVENOUS

## 2012-11-20 MED ORDER — SODIUM CHLORIDE 0.9 % IV SOLN
250.0000 mL | INTRAVENOUS | Status: DC
  Administered 2012-11-20: 09:00:00 via INTRAVENOUS

## 2012-11-20 MED ORDER — FAMOTIDINE IN NACL 20-0.9 MG/50ML-% IV SOLN
20.0000 mg | Freq: Once | INTRAVENOUS | Status: AC
  Administered 2012-11-20: 20 mg via INTRAVENOUS

## 2012-11-20 MED ORDER — SODIUM CHLORIDE 0.9 % IV SOLN
1020.0000 mg | Freq: Once | INTRAVENOUS | Status: AC
  Administered 2012-11-20: 1020 mg via INTRAVENOUS
  Filled 2012-11-20: qty 34

## 2012-11-20 NOTE — Patient Instructions (Signed)
Ferumoxytol injection What is this medicine? FERUMOXYTOL is an iron complex. Iron is used to make healthy red blood cells, which carry oxygen and nutrients throughout the body. This medicine is used to treat iron deficiency anemia in people with chronic kidney disease. This medicine may be used for other purposes; ask your health care provider or pharmacist if you have questions. What should I tell my health care provider before I take this medicine? They need to know if you have any of these conditions: -anemia not caused by low iron levels -high levels of iron in the blood -magnetic resonance imaging (MRI) test scheduled -an unusual or allergic reaction to iron, other medicines, foods, dyes, or preservatives -pregnant or trying to get pregnant -breast-feeding How should I use this medicine? This medicine is for infusion into a vein. It is given by a health care professional in a hospital or clinic setting. Talk to your pediatrician regarding the use of this medicine in children. Special care may be needed. Overdosage: If you think you've taken too much of this medicine contact a poison control center or emergency room at once. Overdosage: If you think you have taken too much of this medicine contact a poison control center or emergency room at once. NOTE: This medicine is only for you. Do not share this medicine with others. What if I miss a dose? It is important not to miss your dose. Call your doctor or health care professional if you are unable to keep an appointment. What may interact with this medicine? This medicine may interact with the following medications: -other iron products This list may not describe all possible interactions. Give your health care provider a list of all the medicines, herbs, non-prescription drugs, or dietary supplements you use. Also tell them if you smoke, drink alcohol, or use illegal drugs. Some items may interact with your medicine. What should I watch  for while using this medicine? Visit your doctor or healthcare professional regularly. Tell your doctor or healthcare professional if your symptoms do not start to get better or if they get worse. You may need blood work done while you are taking this medicine. You may need to follow a special diet. Talk to your doctor. Foods that contain iron include: whole grains/cereals, dried fruits, beans, or peas, leafy green vegetables, and organ meats (liver, kidney). What side effects may I notice from receiving this medicine? Side effects that you should report to your doctor or health care professional as soon as possible: -allergic reactions like skin rash, itching or hives, swelling of the face, lips, or tongue -breathing problems -changes in blood pressure -feeling faint or lightheaded, falls -fever or chills -flushing, sweating, or hot feelings -swelling of the ankles or feet Side effects that usually do not require medical attention (Report these to your doctor or health care professional if they continue or are bothersome.): -diarrhea -headache -nausea, vomiting -stomach pain This list may not describe all possible side effects. Call your doctor for medical advice about side effects. You may report side effects to FDA at 1-800-FDA-1088. Where should I keep my medicine? This drug is given in a hospital or clinic and will not be stored at home. NOTE: This sheet is a summary. It may not cover all possible information. If you have questions about this medicine, talk to your doctor, pharmacist, or health care provider.  2013, [employer] Standard. (02/04/2008 9:48:25 PM)  

## 2012-11-20 NOTE — Progress Notes (Signed)
Pt tolerated infusion without problems. Apologized for " talking ugly" earlier this week to the nurses and Dr Murinson 

## 2012-11-21 ENCOUNTER — Other Ambulatory Visit: Payer: Self-pay | Admitting: Oncology

## 2012-11-24 ENCOUNTER — Other Ambulatory Visit: Payer: Medicare Other

## 2012-11-26 ENCOUNTER — Other Ambulatory Visit: Payer: Self-pay | Admitting: Internal Medicine

## 2012-11-27 ENCOUNTER — Other Ambulatory Visit: Payer: Medicare Other

## 2012-12-01 ENCOUNTER — Other Ambulatory Visit (HOSPITAL_BASED_OUTPATIENT_CLINIC_OR_DEPARTMENT_OTHER): Payer: Medicare Other

## 2012-12-01 ENCOUNTER — Other Ambulatory Visit: Payer: Self-pay | Admitting: Gastroenterology

## 2012-12-01 DIAGNOSIS — K921 Melena: Secondary | ICD-10-CM | POA: Diagnosis not present

## 2012-12-01 DIAGNOSIS — D500 Iron deficiency anemia secondary to blood loss (chronic): Secondary | ICD-10-CM | POA: Diagnosis not present

## 2012-12-01 LAB — CBC WITH DIFFERENTIAL/PLATELET
Basophils Absolute: 0 10*3/uL (ref 0.0–0.1)
Eosinophils Absolute: 0.1 10*3/uL (ref 0.0–0.5)
HCT: 30 % — ABNORMAL LOW (ref 34.8–46.6)
HGB: 9.8 g/dL — ABNORMAL LOW (ref 11.6–15.9)
LYMPH%: 17.5 % (ref 14.0–49.7)
MCHC: 32.6 g/dL (ref 31.5–36.0)
MONO#: 0.7 10*3/uL (ref 0.1–0.9)
NEUT#: 5 10*3/uL (ref 1.5–6.5)
NEUT%: 70.7 % (ref 38.4–76.8)
Platelets: 251 10*3/uL (ref 145–400)
WBC: 7 10*3/uL (ref 3.9–10.3)
lymph#: 1.2 10*3/uL (ref 0.9–3.3)

## 2012-12-01 NOTE — Telephone Encounter (Signed)
Called to pharm 

## 2012-12-02 ENCOUNTER — Other Ambulatory Visit: Payer: Self-pay | Admitting: *Deleted

## 2012-12-02 MED ORDER — OXYCODONE-ACETAMINOPHEN 10-325 MG PO TABS: 1.0000 | ORAL_TABLET | Freq: Four times a day (QID) | ORAL | Status: DC | PRN

## 2012-12-04 ENCOUNTER — Encounter (HOSPITAL_COMMUNITY): Payer: Self-pay | Admitting: *Deleted

## 2012-12-04 ENCOUNTER — Encounter (HOSPITAL_COMMUNITY)
Admission: RE | Disposition: A | Discharge status: Home / Self Care | Payer: Self-pay | Source: Ambulatory Visit | Attending: Gastroenterology

## 2012-12-04 ENCOUNTER — Ambulatory Visit (HOSPITAL_COMMUNITY)
Admission: RE | Admit: 2012-12-04 | Discharge: 2012-12-04 | Disposition: A | Discharge status: Home / Self Care | Payer: Medicare Other | Source: Ambulatory Visit | Attending: Gastroenterology | Admitting: Gastroenterology

## 2012-12-04 DIAGNOSIS — K5521 Angiodysplasia of colon with hemorrhage: Secondary | ICD-10-CM | POA: Diagnosis not present

## 2012-12-04 DIAGNOSIS — K31811 Angiodysplasia of stomach and duodenum with bleeding: Secondary | ICD-10-CM | POA: Diagnosis not present

## 2012-12-04 DIAGNOSIS — Z79899 Other long term (current) drug therapy: Secondary | ICD-10-CM | POA: Insufficient documentation

## 2012-12-04 DIAGNOSIS — D500 Iron deficiency anemia secondary to blood loss (chronic): Secondary | ICD-10-CM | POA: Diagnosis not present

## 2012-12-04 DIAGNOSIS — R710 Precipitous drop in hematocrit: Secondary | ICD-10-CM | POA: Diagnosis not present

## 2012-12-04 HISTORY — PX: ENTEROSCOPY: SHX5533

## 2012-12-04 SURGERY — ENTEROSCOPY
Anesthesia: Moderate Sedation

## 2012-12-04 MED ORDER — MIDAZOLAM HCL 10 MG/2ML IJ SOLN
INTRAMUSCULAR | Status: AC
  Filled 2012-12-04: qty 2

## 2012-12-04 MED ORDER — FENTANYL CITRATE 0.05 MG/ML IJ SOLN
INTRAMUSCULAR | Status: DC | PRN
  Administered 2012-12-04 (×3): 25 ug via INTRAVENOUS

## 2012-12-04 MED ORDER — MIDAZOLAM HCL 10 MG/2ML IJ SOLN
INTRAMUSCULAR | Status: DC | PRN
  Administered 2012-12-04 (×3): 1 mg via INTRAVENOUS
  Administered 2012-12-04 (×2): 2 mg via INTRAVENOUS
  Administered 2012-12-04: 1 mg via INTRAVENOUS

## 2012-12-04 MED ORDER — FENTANYL CITRATE 0.05 MG/ML IJ SOLN
INTRAMUSCULAR | Status: AC
  Filled 2012-12-04: qty 4

## 2012-12-04 MED ORDER — GLUCAGON HCL (RDNA) 1 MG IJ SOLR
INTRAMUSCULAR | Status: AC
  Filled 2012-12-04: qty 1

## 2012-12-04 MED ORDER — BUTAMBEN-TETRACAINE-BENZOCAINE 2-2-14 % EX AERO
INHALATION_SPRAY | CUTANEOUS | Status: DC | PRN
  Administered 2012-12-04: 1 via TOPICAL

## 2012-12-04 MED ORDER — GLUCAGON HCL (RDNA) 1 MG IJ SOLR
INTRAMUSCULAR | Status: DC | PRN
  Administered 2012-12-04: .5 mg via INTRAVENOUS
  Administered 2012-12-04: 1 mg via INTRAVENOUS
  Administered 2012-12-04: .5 mg via INTRAVENOUS

## 2012-12-04 MED ORDER — SODIUM CHLORIDE 0.9 % IV SOLN
INTRAVENOUS | Status: DC
  Administered 2012-12-04: 500 mL via INTRAVENOUS

## 2012-12-04 NOTE — Interval H&P Note (Signed)
History and Physical Interval Note:  12/04/2012 2:15 PM  [name]  has presented today for surgery, with the diagnosis of IDA  The various methods of treatment have been discussed with the patient and family. After consideration of risks, benefits and other options for treatment, the patient has consented to  Procedure(s): ENTEROSCOPY (N/A) as a surgical intervention .  The patient's history has been reviewed, patient examined, no change in status, stable for surgery.  I have reviewed the patient's chart and labs.  Questions were answered to the patient's satisfaction.     Sireen Halk D

## 2012-12-04 NOTE — Op Note (Signed)
[facility] [address] [place], [address]   OPERATIVE PROCEDURE REPORT  PATIENT: [name], [name]  MR#: [id number] BIRTHDATE: July 16, 1950 , 62  yrs. old GENDER: Female ENDOSCOPIST: Jeani Hawking, MD REFERRED BY: PROCEDURE DATE: 12/04/2012 PROCEDURE:   Small bowel enteroscopy with control of bleeding ASA CLASS:   Class III INDICATIONS:1.  control bleeding. MEDICATIONS: Versed 8 mg IV, Fentanyl 75 mcg IV, and Glucagon 2 mg IV TOPICAL ANESTHETIC:   Cetacaine Spray  DESCRIPTION OF PROCEDURE:   After the risks benefits and alternatives of the procedure were thoroughly explained, informed consent was obtained.  The     endoscope was introduced through the mouth  and advanced to the proximal jejunum jejunum , limited by Without limitations.   The instrument was slowly withdrawn as the mucosa was fully examined.    FINDINGS: In the proximal jejunum versus the fourth portion of the duodenum a bleeding AVM was identified.  the bleeding was subtle at first and hidden behind a couple of folds.  Manipulation of the folds resulted in intermittent bleeding.  Visualization of the AVM was extremely difficult.  Three hemoclips were required to pin the folds back and allow for some visualization.  With slow and careful persistence the site of bleeding was identified.  APC was appliced to the area, but oozing occurred at the [facility] site.  Two hemoclips were deployed, but some fresh blood was identified at an adjacent site.  It was clearly an AVM, but I do not know if this was a secondary AVM or the true primary AVM.  The site was treated with APC and secured with a hemoclip as a  trickle of blood surfaced from the cauterized site.  A total of 6 hemoclips were used, but three were deployed to arrest the bleeding.  No further oozing of blood was noted and no other lesions were found in the small bowel. Retroflexed views revealed no abnormalities.    The scope was then withdrawn  from the patient and the procedure terminated.  COMPLICATIONS: There were no complications. 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  RECOMMENDATIONS: 1) Follow HGB in two weeks. REPEAT EXAM:  _______________________________ eSignedJeani Hawking, MD 12/04/2012 3:40 PM   CC:

## 2012-12-04 NOTE — H&P (View-Only) (Signed)
This patient has received IV Feraheme 1020 mg on 06/06/2012, 09/10/2012 and 10/15/2012.  Ferritin on 10/31/2012 was 405 but fell to 1:15 on 11/10/2012. Hemoglobin on 11/10/2012 was 10.3 with a hematocrit of 32.5.  The patient was scheduled for IV Feraheme today. CBC today, 11/17/2012, was 8.4 with a hematocrit of 26.0.  The patient has had multiple previous doses of Feraheme without any problems. Today after receiving a very small amount the patient developed intense pruritus involving her right neck region. We did not see an actual rash were hives. The patient did not appear to have any difficulty breathing or abnormal vital signs. The infusion was immediately stopped. The patient received IV Benadryl 50 mg, Pepcid 20 mg and Solu-Medrol 60 mg.  We were hoping to give the patient IV Feraheme after 30-60 minutes when her symptoms subsided. Patient did not want to complete the IV Feraheme infusion today. This will have to be rescheduled.   At this point it appears the patient is having fairly brisk bleeding on the basis of the drop in her hemoglobin hematocrit and ferritin. We will need to get an appointment for her with Dr. Jeani Hawking.  I have ordered monthly IV Feraheme 1020 mg along with the following premeds: Benadryl 50 mg, Pepcid 20 mg and Solu-Medrol 80 mg, all IV.  The patient has standing orders for monthly CBC and ferritin. We probably need to increase this to every 2 weeks with standing orders.

## 2012-12-05 ENCOUNTER — Encounter (HOSPITAL_COMMUNITY): Payer: Self-pay | Admitting: Gastroenterology

## 2012-12-08 ENCOUNTER — Other Ambulatory Visit: Payer: Medicare Other

## 2012-12-15 ENCOUNTER — Other Ambulatory Visit: Payer: Self-pay | Admitting: Gastroenterology

## 2012-12-15 ENCOUNTER — Other Ambulatory Visit (HOSPITAL_BASED_OUTPATIENT_CLINIC_OR_DEPARTMENT_OTHER): Payer: Medicare Other

## 2012-12-15 ENCOUNTER — Ambulatory Visit (HOSPITAL_BASED_OUTPATIENT_CLINIC_OR_DEPARTMENT_OTHER): Payer: Medicare Other

## 2012-12-15 VITALS — BP 162/78 | HR 66 | Resp 20

## 2012-12-15 DIAGNOSIS — K922 Gastrointestinal hemorrhage, unspecified: Secondary | ICD-10-CM

## 2012-12-15 DIAGNOSIS — D500 Iron deficiency anemia secondary to blood loss (chronic): Secondary | ICD-10-CM

## 2012-12-15 DIAGNOSIS — D509 Iron deficiency anemia, unspecified: Secondary | ICD-10-CM

## 2012-12-15 DIAGNOSIS — K5521 Angiodysplasia of colon with hemorrhage: Secondary | ICD-10-CM | POA: Diagnosis not present

## 2012-12-15 LAB — CBC WITH DIFFERENTIAL/PLATELET
BASO%: 0.8 % (ref 0.0–2.0)
EOS%: 1.4 % (ref 0.0–7.0)
HGB: 10.8 g/dL — ABNORMAL LOW (ref 11.6–15.9)
MCH: 30.7 pg (ref 25.1–34.0)
MCHC: 32.2 g/dL (ref 31.5–36.0)
MCV: 95.1 fL (ref 79.5–101.0)
MONO%: 12.3 % (ref 0.0–14.0)
RBC: 3.53 10*6/uL — ABNORMAL LOW (ref 3.70–5.45)
RDW: 17.1 % — ABNORMAL HIGH (ref 11.2–14.5)
lymph#: 1.1 10*3/uL (ref 0.9–3.3)

## 2012-12-15 LAB — FERRITIN CHCC: Ferritin: 120 ng/ml (ref 9–269)

## 2012-12-15 MED ORDER — FAMOTIDINE IN NACL 20-0.9 MG/50ML-% IV SOLN: 20.0000 mg | Freq: Once | INTRAVENOUS | Status: DC

## 2012-12-15 MED ORDER — DIPHENHYDRAMINE HCL 50 MG/ML IJ SOLN
50.0000 mg | Freq: Once | INTRAMUSCULAR | Status: AC
  Administered 2012-12-15: 50 mg via INTRAVENOUS

## 2012-12-15 MED ORDER — SODIUM CHLORIDE 0.9 % IV SOLN
1020.0000 mg | Freq: Once | INTRAVENOUS | Status: AC
  Administered 2012-12-15: 1020 mg via INTRAVENOUS
  Filled 2012-12-15: qty 34

## 2012-12-15 MED ORDER — SODIUM CHLORIDE 0.9 % IV SOLN
Freq: Once | INTRAVENOUS | Status: AC
  Administered 2012-12-15: 11:00:00 via INTRAVENOUS

## 2012-12-15 MED ORDER — SODIUM CHLORIDE 0.9 % IJ SOLN
3.0000 mL | Freq: Once | INTRAMUSCULAR | Status: DC | PRN
  Filled 2012-12-15: qty 10

## 2012-12-15 MED ORDER — SODIUM CHLORIDE 0.9 % IV SOLN
50.0000 mg | Freq: Once | INTRAVENOUS | Status: AC
  Administered 2012-12-15: 50 mg via INTRAVENOUS
  Filled 2012-12-15: qty 2

## 2012-12-15 MED ORDER — METHYLPREDNISOLONE SODIUM SUCC 125 MG IJ SOLR
80.0000 mg | Freq: Once | INTRAMUSCULAR | Status: AC
  Administered 2012-12-15: 80 mg via INTRAVENOUS

## 2012-12-15 NOTE — Patient Instructions (Addendum)
Ferumoxytol injection What is this medicine? FERUMOXYTOL is an iron complex. Iron is used to make healthy red blood cells, which carry oxygen and nutrients throughout the body. This medicine is used to treat iron deficiency anemia in people with chronic kidney disease. This medicine may be used for other purposes; ask your health care provider or pharmacist if you have questions. What should I tell my health care provider before I take this medicine? They need to know if you have any of these conditions: -anemia not caused by low iron levels -high levels of iron in the blood -magnetic resonance imaging (MRI) test scheduled -an unusual or allergic reaction to iron, other medicines, foods, dyes, or preservatives -pregnant or trying to get pregnant -breast-feeding How should I use this medicine? This medicine is for infusion into a vein. It is given by a health care professional in a hospital or clinic setting. Talk to your pediatrician regarding the use of this medicine in children. Special care may be needed. Overdosage: If you think you've taken too much of this medicine contact a poison control center or emergency room at once. Overdosage: If you think you have taken too much of this medicine contact a poison control center or emergency room at once. NOTE: This medicine is only for you. Do not share this medicine with others. What if I miss a dose? It is important not to miss your dose. Call your doctor or health care professional if you are unable to keep an appointment. What may interact with this medicine? This medicine may interact with the following medications: -other iron products This list may not describe all possible interactions. Give your health care provider a list of all the medicines, herbs, non-prescription drugs, or dietary supplements you use. Also tell them if you smoke, drink alcohol, or use illegal drugs. Some items may interact with your medicine. What should I watch  for while using this medicine? Visit your doctor or healthcare professional regularly. Tell your doctor or healthcare professional if your symptoms do not start to get better or if they get worse. You may need blood work done while you are taking this medicine. You may need to follow a special diet. Talk to your doctor. Foods that contain iron include: whole grains/cereals, dried fruits, beans, or peas, leafy green vegetables, and organ meats (liver, kidney). What side effects may I notice from receiving this medicine? Side effects that you should report to your doctor or health care professional as soon as possible: -allergic reactions like skin rash, itching or hives, swelling of the face, lips, or tongue -breathing problems -changes in blood pressure -feeling faint or lightheaded, falls -fever or chills -flushing, sweating, or hot feelings -swelling of the ankles or feet Side effects that usually do not require medical attention (Report these to your doctor or health care professional if they continue or are bothersome.): -diarrhea -headache -nausea, vomiting -stomach pain This list may not describe all possible side effects. Call your doctor for medical advice about side effects. You may report side effects to FDA at 1-800-FDA-1088. Where should I keep my medicine? This drug is given in a hospital or clinic and will not be stored at home. NOTE: This sheet is a summary. It may not cover all possible information. If you have questions about this medicine, talk to your doctor, pharmacist, or health care provider.  2013, [employer]. (02/04/2008 9:48:25 PM)  

## 2012-12-18 ENCOUNTER — Ambulatory Visit (HOSPITAL_COMMUNITY)
Admission: RE | Admit: 2012-12-18 | Discharge: 2012-12-18 | Disposition: A | Discharge status: Home / Self Care | Payer: Medicare Other | Source: Ambulatory Visit | Attending: Gastroenterology | Admitting: Gastroenterology

## 2012-12-18 ENCOUNTER — Encounter (HOSPITAL_COMMUNITY)
Admission: RE | Disposition: A | Discharge status: Home / Self Care | Payer: Self-pay | Source: Ambulatory Visit | Attending: Gastroenterology

## 2012-12-18 ENCOUNTER — Encounter (HOSPITAL_COMMUNITY): Payer: Self-pay

## 2012-12-18 DIAGNOSIS — D509 Iron deficiency anemia, unspecified: Secondary | ICD-10-CM | POA: Insufficient documentation

## 2012-12-18 DIAGNOSIS — K5520 Angiodysplasia of colon without hemorrhage: Secondary | ICD-10-CM | POA: Diagnosis not present

## 2012-12-18 DIAGNOSIS — K31819 Angiodysplasia of stomach and duodenum without bleeding: Secondary | ICD-10-CM | POA: Insufficient documentation

## 2012-12-18 DIAGNOSIS — D500 Iron deficiency anemia secondary to blood loss (chronic): Secondary | ICD-10-CM | POA: Diagnosis not present

## 2012-12-18 HISTORY — PX: ENTEROSCOPY: SHX5533

## 2012-12-18 SURGERY — ENTEROSCOPY
Anesthesia: Moderate Sedation

## 2012-12-18 MED ORDER — FENTANYL CITRATE 0.05 MG/ML IJ SOLN
INTRAMUSCULAR | Status: DC | PRN
  Administered 2012-12-18 (×4): 25 ug via INTRAVENOUS

## 2012-12-18 MED ORDER — MIDAZOLAM HCL 10 MG/2ML IJ SOLN
INTRAMUSCULAR | Status: DC | PRN
  Administered 2012-12-18 (×3): 1 mg via INTRAVENOUS
  Administered 2012-12-18: 2 mg via INTRAVENOUS
  Administered 2012-12-18 (×2): 1 mg via INTRAVENOUS
  Administered 2012-12-18: 2 mg via INTRAVENOUS
  Administered 2012-12-18: 1 mg via INTRAVENOUS

## 2012-12-18 MED ORDER — MIDAZOLAM HCL 10 MG/2ML IJ SOLN
INTRAMUSCULAR | Status: AC
  Filled 2012-12-18: qty 4

## 2012-12-18 MED ORDER — GLUCAGON HCL (RDNA) 1 MG IJ SOLR
INTRAMUSCULAR | Status: DC | PRN
  Administered 2012-12-18: 1 mg via INTRAVENOUS

## 2012-12-18 MED ORDER — FENTANYL CITRATE 0.05 MG/ML IJ SOLN
INTRAMUSCULAR | Status: AC
  Filled 2012-12-18: qty 4

## 2012-12-18 MED ORDER — GLUCAGON HCL (RDNA) 1 MG IJ SOLR
INTRAMUSCULAR | Status: AC
  Filled 2012-12-18: qty 1

## 2012-12-18 MED ORDER — SODIUM CHLORIDE 0.9 % IV SOLN
INTRAVENOUS | Status: DC
  Administered 2012-12-18: 14:00:00 via INTRAVENOUS

## 2012-12-18 NOTE — Op Note (Signed)
[facility] [address] [place], [address]   OPERATIVE PROCEDURE REPORT  PATIENT: [name], [name]  MR#: [id number] BIRTHDATE: April 16, 1951 , 62  yrs. old GENDER: Female ENDOSCOPIST: Jeani Hawking, MD REFERRED BY: PROCEDURE DATE: 12/18/2012 PROCEDURE:   Small bowel enteroscopy with ablation therapy ASA CLASS:   Class III INDICATIONS:1.  hemoccult positive stools. MEDICATIONS: Versed 10 mg IV, Fentanyl 100 mcg IV, and Glucagon 1 mg  TOPICAL ANESTHETIC:   Cetacaine Spray  DESCRIPTION OF PROCEDURE:   After the risks benefits and alternatives of the procedure were thoroughly explained, informed consent was obtained.  The     endoscope was introduced through the mouth  and advanced to the proximal jejunum jejunum , limited by Without limitations.   The instrument was slowly withdrawn as the mucosa was fully examined.    FINDINGS:  The enteroscope was advanced to the proximal jejunum distal duodenum.  There was no evidence of any active bleeding or old blood.  The prior APC site was noted in the distal duodenum.  A couple of faint AVMs were ablated during the withdrawal of the scope, however, a classic appearing nonbleeding AVM was noted in the transition from D1 to D2.  APC was applied and the site was ablated.  No other abnormalities identified.   Retroflexed views revealed no abnormalities.    The scope was then withdrawn from the patient and the procedure terminated.  COMPLICATIONS: There were no complications. ENDOSCOPIC IMPRESSION: 1) Small bowel AVMs s/p APC.  RECOMMENDATIONS: 1) Follow HGB. 2) Iron infusions per Hematology. 3) Find new GI physician.  Before the procedure I spoke with the patient about her behavior in the office.  Her verbal comments to my staff will not be tolerated.  She is discharged from the practice.  REPEAT EXAM:  _______________________________ eSignedJeani Hawking, MD 12/18/2012 3:20 PM   CC:

## 2012-12-18 NOTE — H&P (View-Only) (Signed)
Pt tolerated infusion without problems. Apologized for " talking ugly" earlier this week to the nurses and Dr Arline Asp

## 2012-12-18 NOTE — Interval H&P Note (Signed)
History and Physical Interval Note:  12/18/2012 2:38 PM  [name]  has presented today for surgery, with the diagnosis of IDA  The various methods of treatment have been discussed with the patient and family. After consideration of risks, benefits and other options for treatment, the patient has consented to  Procedure(s): ENTEROSCOPY (N/A) as a surgical intervention .  The patient's history has been reviewed, patient examined, no change in status, stable for surgery.  I have reviewed the patient's chart and labs.  Questions were answered to the patient's satisfaction.     Jeanluc Wegman D

## 2012-12-22 ENCOUNTER — Encounter (HOSPITAL_COMMUNITY): Payer: Self-pay | Admitting: Gastroenterology

## 2012-12-22 ENCOUNTER — Other Ambulatory Visit: Payer: Medicare Other

## 2012-12-24 ENCOUNTER — Other Ambulatory Visit: Payer: Self-pay | Admitting: Internal Medicine

## 2012-12-29 ENCOUNTER — Other Ambulatory Visit (HOSPITAL_BASED_OUTPATIENT_CLINIC_OR_DEPARTMENT_OTHER): Payer: Medicare Other | Admitting: Lab

## 2012-12-29 DIAGNOSIS — D500 Iron deficiency anemia secondary to blood loss (chronic): Secondary | ICD-10-CM | POA: Diagnosis not present

## 2012-12-29 LAB — CBC WITH DIFFERENTIAL/PLATELET
Basophils Absolute: 0 10*3/uL (ref 0.0–0.1)
EOS%: 1.1 % (ref 0.0–7.0)
Eosinophils Absolute: 0.1 10*3/uL (ref 0.0–0.5)
HGB: 12.1 g/dL (ref 11.6–15.9)
MONO%: 16.4 % — ABNORMAL HIGH (ref 0.0–14.0)
NEUT#: 3.6 10*3/uL (ref 1.5–6.5)
RBC: 3.87 10*6/uL (ref 3.70–5.45)
RDW: 17.1 % — ABNORMAL HIGH (ref 11.2–14.5)
lymph#: 2.1 10*3/uL (ref 0.9–3.3)

## 2012-12-29 LAB — FERRITIN CHCC: Ferritin: 753 ng/ml — ABNORMAL HIGH (ref 9–269)

## 2013-01-02 ENCOUNTER — Encounter: Payer: Self-pay | Admitting: Internal Medicine

## 2013-01-02 ENCOUNTER — Encounter: Payer: Medicare Other | Admitting: Internal Medicine

## 2013-01-02 ENCOUNTER — Ambulatory Visit (INDEPENDENT_AMBULATORY_CARE_PROVIDER_SITE_OTHER): Payer: Medicare Other | Admitting: Internal Medicine

## 2013-01-02 ENCOUNTER — Other Ambulatory Visit: Payer: Medicare Other

## 2013-01-02 VITALS — BP 144/78 | HR 73 | Temp 97.5°F | Wt 181.9 lb

## 2013-01-02 DIAGNOSIS — G8929 Other chronic pain: Secondary | ICD-10-CM

## 2013-01-02 DIAGNOSIS — R109 Unspecified abdominal pain: Secondary | ICD-10-CM

## 2013-01-02 DIAGNOSIS — K432 Incisional hernia without obstruction or gangrene: Secondary | ICD-10-CM

## 2013-01-02 MED ORDER — OXYCODONE-ACETAMINOPHEN 10-325 MG PO TABS: 1.0000 | ORAL_TABLET | Freq: Four times a day (QID) | ORAL | Status: DC | PRN

## 2013-01-02 NOTE — Patient Instructions (Addendum)
-  Follow up with Dr. Burtis Junes next month for your prescription refills.

## 2013-01-03 NOTE — Progress Notes (Signed)
  Subjective:    Patient ID: [name], female    DOB: Feb 13, 1951, 62 y.o.   MRN: [id number]  HPI [name] is a 62 year old woman with PMH of incisional hernia and chronic abdominal pain who presents for follow up visit and medication refill. She has no acute complaints. She has regular BM with no bright blood, no tarry dark stools, and no constipation. She will be seeing her surgeon next month for possible repeated surgical repair of her incisional hernia.     Review of Systems  Constitutional: Negative for fever, chills, diaphoresis, activity change, appetite change and unexpected weight change.  Respiratory: Negative for cough and shortness of breath.   Cardiovascular: Negative for chest pain.  Gastrointestinal: Positive for abdominal pain and abdominal distention. Negative for nausea, vomiting, diarrhea, constipation, blood in stool and rectal pain.       Chronic abdominal distention and pain 2/2 incisional hernia.   Genitourinary: Negative for dysuria.  Musculoskeletal: Positive for myalgias.       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  Skin: Negative for color change, pallor, rash and wound.  Neurological: Negative for dizziness, light-headedness and headaches.  Hematological: Negative for adenopathy.  Psychiatric/Behavioral: Negative for behavioral problems and agitation.       Objective:   Physical Exam  Nursing note and vitals reviewed. Constitutional: She is oriented to person, place, and time. She appears well-developed and well-nourished.  HENT:  Head: Normocephalic.  Eyes: Conjunctivae are normal. No scleral icterus.  Cardiovascular: Normal rate and regular rhythm.   Pulmonary/Chest: Effort normal. No respiratory distress.  Abdominal: Soft. Bowel sounds are normal. She exhibits distension and mass. There is no tenderness. There is no rebound and no guarding.  Midline incision scar with supraumbilical 3cm area of granulation tissue with no signs of  infection (no discharge, odor, or bleed). Abdominal/incisional hernia palpated on the right upper/lower quadrant that is reducible with no tenderness to palpation.   Musculoskeletal:  Left AKA, prosthetic limb in place.   Neurological: She is alert and oriented to person, place, and time.  Skin: Skin is warm and dry. No rash noted. She is not diaphoretic. No erythema. No pallor.  Psychiatric: She has a normal mood and affect. Her behavior is normal.          Assessment & Plan:

## 2013-01-03 NOTE — Assessment & Plan Note (Addendum)
No acute changes in her chronic pain, hernia is reducible. Nl BMs, no constipation. She has follow up with her surgeon next month for possible surgical repair of her hernia.  Rx of Percocet 10-325mg  q6h #120 given during this visit. Pt instructed to follow up with her PCP for future refills.

## 2013-01-05 ENCOUNTER — Other Ambulatory Visit: Payer: Medicare Other

## 2013-01-05 NOTE — Progress Notes (Signed)
Case discussed with Dr. Kennerly soon after the resident saw the patient.  We reviewed the resident's history and exam and pertinent patient test results.  I agree with the assessment, diagnosis, and plan of care documented in the resident's note. 

## 2013-01-12 ENCOUNTER — Other Ambulatory Visit (HOSPITAL_BASED_OUTPATIENT_CLINIC_OR_DEPARTMENT_OTHER): Payer: Medicare Other

## 2013-01-12 ENCOUNTER — Ambulatory Visit (HOSPITAL_BASED_OUTPATIENT_CLINIC_OR_DEPARTMENT_OTHER): Payer: Medicare Other

## 2013-01-12 DIAGNOSIS — D509 Iron deficiency anemia, unspecified: Secondary | ICD-10-CM

## 2013-01-12 DIAGNOSIS — D500 Iron deficiency anemia secondary to blood loss (chronic): Secondary | ICD-10-CM

## 2013-01-12 LAB — CBC WITH DIFFERENTIAL/PLATELET
BASO%: 0.6 % (ref 0.0–2.0)
Eosinophils Absolute: 0.1 10*3/uL (ref 0.0–0.5)
HCT: 38.5 % (ref 34.8–46.6)
LYMPH%: 23 % (ref 14.0–49.7)
MCHC: 33.5 g/dL (ref 31.5–36.0)
MONO#: 0.9 10*3/uL (ref 0.1–0.9)
NEUT#: 3.3 10*3/uL (ref 1.5–6.5)
Platelets: 213 10*3/uL (ref 145–400)
RBC: 4.09 10*6/uL (ref 3.70–5.45)
WBC: 5.6 10*3/uL (ref 3.9–10.3)
lymph#: 1.3 10*3/uL (ref 0.9–3.3)

## 2013-01-12 LAB — FERRITIN CHCC: Ferritin: 475 ng/ml — ABNORMAL HIGH (ref 9–269)

## 2013-01-12 MED ORDER — SODIUM CHLORIDE 0.9 % IJ SOLN
10.0000 mL | INTRAMUSCULAR | Status: DC | PRN
  Filled 2013-01-12: qty 10

## 2013-01-12 MED ORDER — FAMOTIDINE IN NACL 20-0.9 MG/50ML-% IV SOLN
20.0000 mg | Freq: Once | INTRAVENOUS | Status: AC
  Administered 2013-01-12: 20 mg via INTRAVENOUS

## 2013-01-12 MED ORDER — SODIUM CHLORIDE 0.9 % IJ SOLN
3.0000 mL | Freq: Once | INTRAMUSCULAR | Status: DC | PRN
  Filled 2013-01-12: qty 10

## 2013-01-12 MED ORDER — DIPHENHYDRAMINE HCL 50 MG/ML IJ SOLN
50.0000 mg | Freq: Once | INTRAMUSCULAR | Status: AC
  Administered 2013-01-12: 50 mg via INTRAVENOUS

## 2013-01-12 MED ORDER — METHYLPREDNISOLONE SODIUM SUCC 125 MG IJ SOLR
80.0000 mg | Freq: Once | INTRAMUSCULAR | Status: AC
Start: 2013-01-12 — End: 2013-01-12
  Administered 2013-01-12: 80 mg via INTRAVENOUS

## 2013-01-12 MED ORDER — SODIUM CHLORIDE 0.9 % IV SOLN
1020.0000 mg | Freq: Once | INTRAVENOUS | Status: AC
  Administered 2013-01-12: 1020 mg via INTRAVENOUS
  Filled 2013-01-12: qty 34

## 2013-01-12 NOTE — Patient Instructions (Signed)
Ferumoxytol injection What is this medicine? FERUMOXYTOL is an iron complex. Iron is used to make healthy red blood cells, which carry oxygen and nutrients throughout the body. This medicine is used to treat iron deficiency anemia in people with chronic kidney disease. This medicine may be used for other purposes; ask your health care provider or pharmacist if you have questions. What should I tell my health care provider before I take this medicine? They need to know if you have any of these conditions: -anemia not caused by low iron levels -high levels of iron in the blood -magnetic resonance imaging (MRI) test scheduled -an unusual or allergic reaction to iron, other medicines, foods, dyes, or preservatives -pregnant or trying to get pregnant -breast-feeding How should I use this medicine? This medicine is for infusion into a vein. It is given by a health care professional in a hospital or clinic setting. Talk to your pediatrician regarding the use of this medicine in children. Special care may be needed. Overdosage: If you think you've taken too much of this medicine contact a poison control center or emergency room at once. Overdosage: If you think you have taken too much of this medicine contact a poison control center or emergency room at once. NOTE: This medicine is only for you. Do not share this medicine with others. What if I miss a dose? It is important not to miss your dose. Call your doctor or health care professional if you are unable to keep an appointment. What may interact with this medicine? This medicine may interact with the following medications: -other iron products This list may not describe all possible interactions. Give your health care provider a list of all the medicines, herbs, non-prescription drugs, or dietary supplements you use. Also tell them if you smoke, drink alcohol, or use illegal drugs. Some items may interact with your medicine. What should I watch  for while using this medicine? Visit your doctor or healthcare professional regularly. Tell your doctor or healthcare professional if your symptoms do not start to get better or if they get worse. You may need blood work done while you are taking this medicine. You may need to follow a special diet. Talk to your doctor. Foods that contain iron include: whole grains/cereals, dried fruits, beans, or peas, leafy green vegetables, and organ meats (liver, kidney). What side effects may I notice from receiving this medicine? Side effects that you should report to your doctor or health care professional as soon as possible: -allergic reactions like skin rash, itching or hives, swelling of the face, lips, or tongue -breathing problems -changes in blood pressure -feeling faint or lightheaded, falls -fever or chills -flushing, sweating, or hot feelings -swelling of the ankles or feet Side effects that usually do not require medical attention (Report these to your doctor or health care professional if they continue or are bothersome.): -diarrhea -headache -nausea, vomiting -stomach pain This list may not describe all possible side effects. Call your doctor for medical advice about side effects. You may report side effects to FDA at 1-800-FDA-1088. Where should I keep my medicine? This drug is given in a hospital or clinic and will not be stored at home. NOTE: This sheet is a summary. It may not cover all possible information. If you have questions about this medicine, talk to your doctor, pharmacist, or health care provider.  2012, [employer]/Gold Standard. (02/04/2008 9:48:25 PM) 

## 2013-01-16 ENCOUNTER — Encounter: Payer: Self-pay | Admitting: Internal Medicine

## 2013-01-16 ENCOUNTER — Ambulatory Visit (INDEPENDENT_AMBULATORY_CARE_PROVIDER_SITE_OTHER): Payer: Medicare Other | Admitting: Internal Medicine

## 2013-01-16 ENCOUNTER — Other Ambulatory Visit: Payer: Self-pay | Admitting: Internal Medicine

## 2013-01-16 VITALS — BP 140/83 | HR 79 | Temp 98.0°F | Ht 62.0 in | Wt 180.2 lb

## 2013-01-16 DIAGNOSIS — R109 Unspecified abdominal pain: Secondary | ICD-10-CM

## 2013-01-16 DIAGNOSIS — G8929 Other chronic pain: Secondary | ICD-10-CM | POA: Diagnosis not present

## 2013-01-16 MED ORDER — ESOMEPRAZOLE MAGNESIUM 40 MG PO CPDR: 40.0000 mg | DELAYED_RELEASE_CAPSULE | Freq: Two times a day (BID) | ORAL | Status: DC

## 2013-01-16 MED ORDER — TRAZODONE HCL 100 MG PO TABS: ORAL_TABLET | ORAL | Status: DC

## 2013-01-16 MED ORDER — OXYCODONE-ACETAMINOPHEN 10-325 MG PO TABS: 1.0000 | ORAL_TABLET | Freq: Four times a day (QID) | ORAL | Status: DC | PRN

## 2013-01-16 NOTE — Progress Notes (Signed)
Subjective:   Patient ID: [name] female   DOB: 01-02-1951 62 y.o.   MRN: [id number]  HPI: [name] is a 62 y.o. African American woman with a past medical history of hypertension, large nonhealing ventral hernia, PVD status post left BKA coming in for medication refills. Patient states she has been doing well and is undergoing evaluation and will be up for ventral hernia repair surgery in September. The patient is a little apprehensive given the very poor and traumatic outcome during her emergent Hartman's procedure last year. Otherwise the patient has had no shortness of breath, chest pain, headache, nausea/vomiting/diarrhea, or any other new rashes. The patient's chronic abdominal pain is managed on current pain medications but she has noticed that her abdomen continues to grow and girth. Patient has not noticed any hematocrit daily or hematemesis, dark colored stools or change in bowel habits.    Past Medical History  Diagnosis Date  . Hypertension   . Colon polyps   . Stomach problems   . Hypokalemic alkalosis   . Allergic rhinitis   . GERD (gastroesophageal reflux disease)   . Stroke 2010    no residual problems  . Shortness of breath     due to anemia  . Anemia, iron deficiency 05/03/2011    recieves iron infusions  . Anemia   . GI bleeding   . Diverticulitis   . Transfusion history   . Difficulty sleeping     takes trazadone for sleep  . Peripheral vascular disease    Current Outpatient Prescriptions  Medication Sig Dispense Refill  . aspirin EC 81 MG tablet Take 2 tablets (162 mg total) by mouth 2 (two) times daily.  31 tablet  1  . atenolol (TENORMIN) 50 MG tablet TAKE 1 TABLET BY MOUTH EVERY MORNING  30 tablet  11  . cetirizine (ZYRTEC) 10 MG tablet Take 1 tablet (10 mg total) by mouth daily.  90 tablet  1  . esomeprazole (NEXIUM) 40 MG capsule Take 1 capsule (40 mg total) by mouth 2 (two) times daily.  30 capsule  12  . FLUoxetine (PROZAC) 20 MG  capsule Take 1 capsule (20 mg total) by mouth every morning.  30 capsule  3  . gabapentin (NEURONTIN) 400 MG capsule TAKE 2 CAPSULE BY MOUTH THREE TIMES A DAY  90 capsule  3  . hydrochlorothiazide (HYDRODIURIL) 25 MG tablet Take 1 tablet (25 mg total) by mouth daily.  30 tablet  3  . lisinopril (PRINIVIL,ZESTRIL) 40 MG tablet TAKE 1 TABLET BY MOUTH EVERY MORNING  30 tablet  11  . oxyCODONE-acetaminophen (PERCOCET) 10-325 MG per tablet Take 1 tablet by mouth every 6 (six) hours as needed for pain.  120 tablet  0  . potassium chloride 20 MEQ/15ML (10%) SOLN Take 15 mLs (20 mEq total) by mouth daily.  450 mL  1  . Potassium Chloride Crys CR (KLOR-CON M20 PO) Take by mouth.      Marland Kitchen Specialty Vitamins Products (MAGNESIUM, AMINO ACID CHELATE,) 133 MG tablet Take 1 tablet by mouth 2 (two) times daily.      . traMADol (ULTRAM) 50 MG tablet Take 2 tablets (100 mg total) by mouth every 12 (twelve) hours as needed. For pain.  65 tablet  1  . traZODone (DESYREL) 100 MG tablet TAKE 1 TABLET BY MOUTH EVERY NIGHT AT BEDTIME  30 tablet  1  . vitamin C (ASCORBIC ACID) 500 MG tablet Take 1 tablet (500 mg total) by mouth daily.  30 tablet  12   No current facility-administered medications for this visit.   Family History  Problem Relation Age of Onset  . Cancer Father     unknown  . Hypertension Mother   . Diverticulitis Mother    History   Social History  . Marital Status: Widowed    Spouse Name: N/A    Number of Children: N/A  . Years of Education: N/A   Social History Main Topics  . Smoking status: Current Every Day Smoker -- 0.20 packs/day    Types: Cigarettes  . Smokeless tobacco: Never Used     Comment: wants to quit  . Alcohol Use: Yes     Comment: Occasionally  . Drug Use: No  . Sexual Activity: None   Other Topics Concern  . None   Social History Narrative  . None   Review of Systems: Otherwise negative unless listed in history of present illness  Objective:  Physical  Exam: Filed Vitals:   01/16/13 1339  BP: 140/83  Pulse: 79  Temp: 98 F (36.7 C)  TempSrc: Oral  Height: 5\' 2"  (1.575 m)  Weight: 180 lb 3.2 oz (81.738 kg)  SpO2: 99%   General: resting in bed HEENT: PERRL, EOMI, no scleral icterus Cardiac: RRR, no rubs, murmurs or gallops Pulm: clear to auscultation bilaterally, moving normal volumes of air Abd: soft, nontender, nondistended, BS present, ventral hernia slightly ttp, with large semireducible 5-10 cm collection, healing with granulation tissue midabdominal large 5x3x10 cm incision Ext: warm and well perfused, no pedal edema  Neuro: alert and oriented X3, cranial nerves II-XII grossly intact   Assessment & Plan:  1. Ventral hernia: this seems to be expanding from this providers' exam and pt states is scheduled for surgery in September.  -refill meds -surgery in September  2. Acid reflux: pt no new symptoms just in need of refills -nexium perscription  Patient discussed with Dr. Josem Kaufmann

## 2013-01-18 NOTE — Progress Notes (Signed)
Case discussed with Dr. Sadek at the time of the visit.  We reviewed the resident's history and exam and pertinent patient test results.  I agree with the assessment, diagnosis and plan of care documented in the resident's note. 

## 2013-01-19 ENCOUNTER — Other Ambulatory Visit: Payer: Medicare Other

## 2013-01-21 ENCOUNTER — Other Ambulatory Visit: Payer: Self-pay | Admitting: Internal Medicine

## 2013-01-21 ENCOUNTER — Telehealth: Payer: Self-pay | Admitting: *Deleted

## 2013-01-21 DIAGNOSIS — I10 Essential (primary) hypertension: Secondary | ICD-10-CM

## 2013-01-21 NOTE — Telephone Encounter (Signed)
The pharmacy ask if you want pt to be taking such a high dose of nexium- 80mg  daily, if not, please send a new script

## 2013-01-22 MED ORDER — HYDROCHLOROTHIAZIDE 25 MG PO TABS: 25.0000 mg | ORAL_TABLET | Freq: Every day | ORAL | Status: DC

## 2013-01-30 ENCOUNTER — Encounter (INDEPENDENT_AMBULATORY_CARE_PROVIDER_SITE_OTHER): Payer: Medicare Other | Admitting: Surgery

## 2013-01-30 ENCOUNTER — Ambulatory Visit (HOSPITAL_BASED_OUTPATIENT_CLINIC_OR_DEPARTMENT_OTHER): Payer: Medicare Other | Admitting: Internal Medicine

## 2013-01-30 ENCOUNTER — Other Ambulatory Visit (HOSPITAL_BASED_OUTPATIENT_CLINIC_OR_DEPARTMENT_OTHER): Payer: Medicare Other | Admitting: Lab

## 2013-01-30 ENCOUNTER — Telehealth: Payer: Self-pay | Admitting: Internal Medicine

## 2013-01-30 VITALS — BP 133/73 | HR 79 | Temp 98.6°F | Resp 18 | Ht 62.0 in | Wt 180.8 lb

## 2013-01-30 DIAGNOSIS — K922 Gastrointestinal hemorrhage, unspecified: Secondary | ICD-10-CM

## 2013-01-30 DIAGNOSIS — D500 Iron deficiency anemia secondary to blood loss (chronic): Secondary | ICD-10-CM

## 2013-01-30 DIAGNOSIS — R7401 Elevation of levels of liver transaminase levels: Secondary | ICD-10-CM

## 2013-01-30 DIAGNOSIS — R1032 Left lower quadrant pain: Secondary | ICD-10-CM

## 2013-01-30 DIAGNOSIS — B182 Chronic viral hepatitis C: Secondary | ICD-10-CM

## 2013-01-30 LAB — COMPREHENSIVE METABOLIC PANEL (CC13)
Albumin: 3.2 g/dL — ABNORMAL LOW (ref 3.5–5.0)
Alkaline Phosphatase: 108 U/L (ref 40–150)
BUN: 10.7 mg/dL (ref 7.0–26.0)
CO2: 26 mEq/L (ref 22–29)
Calcium: 8.8 mg/dL (ref 8.4–10.4)
Chloride: 101 mEq/L (ref 98–109)
Glucose: 230 mg/dl — ABNORMAL HIGH (ref 70–140)
Potassium: 3.8 mEq/L (ref 3.5–5.1)
Sodium: 137 mEq/L (ref 136–145)
Total Protein: 6.4 g/dL (ref 6.4–8.3)

## 2013-01-30 LAB — CBC WITH DIFFERENTIAL/PLATELET
Basophils Absolute: 0 10*3/uL (ref 0.0–0.1)
EOS%: 1.6 % (ref 0.0–7.0)
Eosinophils Absolute: 0.1 10*3/uL (ref 0.0–0.5)
HCT: 35.8 % (ref 34.8–46.6)
HGB: 12 g/dL (ref 11.6–15.9)
MCH: 31.4 pg (ref 25.1–34.0)
MONO#: 0.8 10*3/uL (ref 0.1–0.9)
NEUT#: 3.6 10*3/uL (ref 1.5–6.5)
NEUT%: 60.2 % (ref 38.4–76.8)
RDW: 15.8 % — ABNORMAL HIGH (ref 11.2–14.5)
lymph#: 1.4 10*3/uL (ref 0.9–3.3)

## 2013-01-30 LAB — IRON AND TIBC CHCC
Iron: 132 ug/dL (ref 41–142)
TIBC: 271 ug/dL (ref 236–444)

## 2013-01-30 MED ORDER — OXYCODONE HCL 5 MG PO TABS: 5.0000 mg | ORAL_TABLET | ORAL | Status: DC | PRN

## 2013-01-30 NOTE — Telephone Encounter (Signed)
Gave pt appt for lab and MD on January 2015 °

## 2013-01-30 NOTE — Patient Instructions (Addendum)

## 2013-01-30 NOTE — Progress Notes (Signed)
Hematology and Oncology Follow Up Visit  [name] [id number] 1950-12-15 62 y.o. 01/30/2013 1:05 PM Patricia Bame, MD  Principle Diagnosis:  Recurrent iron-deficiency anemia secondary to gastrointestinal bleeding.  Prior Therapy:   The patient has been heavily dependent upon  IV Feraheme. Over the past year or so she has been receiving about 1 Feraheme infusion monthly on average. The patient's most recent infusions of IV Feraheme 510 mg occurred on the following dates: 06/05/2011, 07/03/2011, 07/31/2011, 08/17/2011, 08/28/2011. The patient received IV Feraheme 1020 mg on 10/02/2011 01/30/2012, 06/06/2012 and 09/10/2012.   Current therapy:  As noted above.   Feraheme 1020 mg prn.   Interim History:  [name] was seen today for followup of her recurrent iron-deficiency anemia due to GI bleeding.  GI bleeding is probably due to AV malformations and diverticulosis.  The patient was last seen by Dr. Arline Asp on 05/092014 and prior to that on 06/02/2012 and  01/11/2012.  We have been following her CBC and ferritin on a monthly basis except for the time when she was hospitalized.  Since August 2013, the patient required IV Feraheme 1020 as noted above. Today, [name] is here today  Without her caregiver, [name], who is employed by [employer].  The patient apparently has been under their supervision since 2012.    The patient is here in a wheelchair but gets around at home with a walker.  She lives alone.  She has undergone a left above-the-knee amputation due to necrotizing fasciitis in October 2010.  Her abdominal wound has healed but she still has a large hernia that may require surgery at some point.  She denies any pagophagia or hematochezia or melena. She complains of her right knee bothering her secondary to stress she places on it in the absence of her left limb but otherwise she is  without any specific complaints today.  Medications: I have reviewed the patient's current  medications.  Current Outpatient Prescriptions  Medication Sig Dispense Refill  . aspirin EC 81 MG tablet Take 2 tablets (162 mg total) by mouth 2 (two) times daily.  31 tablet  1  . atenolol (TENORMIN) 50 MG tablet TAKE 1 TABLET BY MOUTH EVERY MORNING  30 tablet  11  . cetirizine (ZYRTEC) 10 MG tablet Take 1 tablet (10 mg total) by mouth daily.  90 tablet  1  . esomeprazole (NEXIUM) 40 MG capsule Take 1 capsule (40 mg total) by mouth 2 (two) times daily.  30 capsule  12  . FLUoxetine (PROZAC) 20 MG capsule Take 1 capsule (20 mg total) by mouth every morning.  30 capsule  3  . hydrochlorothiazide (HYDRODIURIL) 25 MG tablet Take 1 tablet (25 mg total) by mouth daily.  30 tablet  3  . lisinopril (PRINIVIL,ZESTRIL) 40 MG tablet TAKE 1 TABLET BY MOUTH EVERY MORNING  30 tablet  11  . potassium chloride 20 MEQ/15ML (10%) solution TAKE BY MOUTH DAILY  240 mL  2  . Specialty Vitamins Products (MAGNESIUM, AMINO ACID CHELATE,) 133 MG tablet Take 1 tablet by mouth 2 (two) times daily.      . traMADol (ULTRAM) 50 MG tablet Take 2 tablets (100 mg total) by mouth every 12 (twelve) hours as needed. For pain.  65 tablet  1  . traZODone (DESYREL) 100 MG tablet TAKE 1 TABLET BY MOUTH EVERY NIGHT AT BEDTIME  30 tablet  1  . vitamin C (ASCORBIC ACID) 500 MG tablet Take 1 tablet (500 mg total) by  mouth daily.  30 tablet  12  . gabapentin (NEURONTIN) 400 MG capsule TAKE 2 CAPSULE BY MOUTH THREE TIMES A DAY  90 capsule  3  . oxyCODONE (OXY IR/ROXICODONE) 5 MG immediate release tablet Take 1 tablet (5 mg total) by mouth every 4 (four) hours as needed for pain.  90 tablet  0   No current facility-administered medications for this visit.   SMOKING HISTORY: The patient smoked up to a pack and a half of cigarettes a day from age 46 and continues to smoke a couple of  cigarettes a day at the present time.  Allergies:  Allergies  Allergen Reactions  . Ciprofloxacin Hives and Swelling  . Morphine And Related Other  (See Comments)    Makes pt sad and paranoid  . Penicillins Hives and Swelling  . Codeine Hives and Swelling  . Feraheme [Ferumoxytol] Itching    Tolerated Feraheme 6/26 & 7/21    PROBLEM LIST:  1. Recurrent iron-deficiency anemia secondary to gastrointestinal bleeding (as noted above). [name] also has required red cell transfusions in the past, most  recently early December 2012. She underwent a small bowel enteroscopy by Dr. Tilford [name] at [facility] on 08/13/2011. The enteroscope was passed up to 6 feet into the jejunum. He saw multiple AVMs, which were ablated. Two of the large AVMs bled during APC. These were in the 4th part of the duodenum very close to the ligament of Treitz. The remainder of the AVMs were in the  proximal jejunum. In addition to the small bowel AV malformations, which were treated with APC, he saw some small esophageal varices which  were not bleeding and also evidence of mild portal hypertensive gastropathy.  2. History of duodenal arteriovenous malformation.  3. History of hepatitis C infection.  4. Gastroesophageal reflux disease.  5. Depression.  6. Hypertension.  7. History of right Bell's palsy.  8. Status post left above-knee amputation secondary to necrotizing fasciitis 03/06/2009.  9. Neurodermatitis.  10. Previous history of alcohol usage.  11. Admission to the hospital from 12/20/2011 through 12/25/2011 for hematochezia felt to be secondary to a diverticular bleed, requiring 2 units of packed red cells.  12. Diverticulosis.  13. Elevated alpha fetoprotein, 72.3 on 11/19/2011 and 76.0 on 01/11/2012.  14. Abnormal MRI of the liver from 11/28/2011 showing diffuse and markedly low signal intensity throughout the liver as well as  diffuse low signal intensity in the adrenal glands. 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  There were no findings for cirrhosis, portal  hypertension, splenomegaly or ascites, not were there any worrisome enhancing  liver lesions.  15. Systolic ejection murmur.   Past Medical History, Surgical history, Social history, and Family History were reviewed and updated.  Review of Systems: Constitutional:  Negative for fever, chills, night sweats, anorexia, weight loss, pain. Cardiovascular: no chest pain or dyspnea on exertion Respiratory: no cough, shortness of breath, or wheezing Neurological: no TIA or stroke symptoms Dermatological: positive for bruise when she hit her right arm accidentally. ENT: negative for - epistaxis Skin: Negative. Gastrointestinal: no abdominal pain, change in bowel habits, or black or bloody stools Genito-Urinary: no dysuria, trouble voiding, or hematuria Hematological and Lymphatic: negative for - bleeding problems Breast: negative for breast lumps Musculoskeletal: positive for - pain in knee - right Remaining ROS negative.  Physical Exam: Blood pressure 133/73, pulse 79, temperature 98.6 F (37 C), temperature source Oral, resp. rate 18, height 5'  2" (1.575 m), weight 180 lb 12.8 oz (82.01 kg), SpO2 99.00%. ECOG: 3 General appearance: alert, appears stated age, no distress and moderately obese Head: Normocephalic, without obvious abnormality, atraumatic Neck: no adenopathy, supple, symmetrical, trachea midline and thyroid not enlarged, symmetric, no tenderness/mass/nodules Lymph nodes: Cervical adenopathy: None appreciated Heart:regular rate and rhythm, S1, S2 normal and systolic murmur: systolic ejection 2/6, harsh and previously documented without radiation Lung:chest clear, no wheezing, rales, normal symmetric air entry, Heart exam - S1, S2 normal, no murmur, no gallop, rate regular Abdomin: soft, distended, normal bowel sounds, nontender and incisional hernia and easily reducible and nontender EXT:No peripheral edema on right extremity; Left above the knee amputation.  She is wearing a  prothesis.    Lab Results: Lab Results  Component Value Date   WBC 5.9 01/30/2013   HGB 12.0 01/30/2013   HCT 35.8 01/30/2013   MCV 93.7 01/30/2013   PLT 210 01/30/2013     Chemistry      Component Value Date/Time   NA 137 01/30/2013 1054   NA 128* 04/09/2012 2333   K 3.8 01/30/2013 1054   K 3.8 04/09/2012 2333   CL 104 10/03/2012 1048   CL 92* 04/09/2012 2333   CO2 26 01/30/2013 1054   CO2 27 04/09/2012 2333   BUN 10.7 01/30/2013 1054   BUN 7 04/09/2012 2333   CREATININE 0.8 01/30/2013 1054   CREATININE 0.61 04/09/2012 2333      Component Value Date/Time   CALCIUM 8.8 01/30/2013 1054   CALCIUM 8.0* 04/10/2012 0410   ALKPHOS 108 01/30/2013 1054   ALKPHOS 57 04/09/2012 1615   AST 190* 01/30/2013 1054   AST 45* 04/09/2012 1615   ALT 167* 01/30/2013 1054   ALT 40* 04/09/2012 1615   BILITOT 0.49 01/30/2013 1054   BILITOT 0.3 04/09/2012 1615     Iron/TIBC/Ferritin    Component Value Date/Time   IRON 132 01/30/2013 1054   IRON 27* 10/03/2012 1048   TIBC 271 01/30/2013 1054   TIBC 434 10/03/2012 1048   FERRITIN 1,250* 01/30/2013 1054   FERRITIN 41 11/17/2012 1537     Radiological Studies: No results found.   IMPRESSION/PLAN: 1. Iron deficiency anemia secondary to GI lost. Her hemogloblin is within normal limits.  Her ferritin is 1250.  We  will hold transfusions .  We will continue to support this patient as best we can with IV  iron.  We are continuing to check a CBC and ferritin every month. We will plan to see [name] again in 4 months, at which time we will check CBC, chemistries, and iron studies.  2. Elevated transiminases in the setting of an elevated ferritin likely due to secondary hemochromatosis and chronic hepatitis C.  We will continue to hold iron infusions and follow LFTs to establish trend. We prefer to use annual liver MRI and initiate chelation if the liver iron is >3 mg/g dry weight or if the serum ferritin is >1000 ng/mL on two consecutive measurements.  Her last MRI of abdomen  (11/2011) demonstrated findings consistent with secondary or transfusional hemochromatosis involving the liver, spleen, adrenal glands and bone marrow. No findings for cirrhosis, portal hypertension, splenomegaly or ascites.  No worrisome enhancing liver lesions.  We will perform an ultrasound of the liver in continue to  trend or LFTs increase or based on symptoms.   Patient was counseled to avoid hepatatoxins including percocet which has tylenol.  We provided her with oxycodone 5 mg instead.    Spent  more than half the time coordinating care.    Saroya Riccobono, MD 9/7/20141:05 PM

## 2013-02-02 ENCOUNTER — Other Ambulatory Visit: Payer: Medicare Other

## 2013-02-02 ENCOUNTER — Other Ambulatory Visit: Payer: Self-pay | Admitting: Medical Oncology

## 2013-02-02 ENCOUNTER — Telehealth: Payer: Self-pay | Admitting: Medical Oncology

## 2013-02-02 NOTE — Telephone Encounter (Signed)
I called pt to inform her that Dr. Rosie Fate has ordered her an ultrasound of the liver. Her last liver enzymes were elevated and he would like to  Follow up on this. He also has ordered monthly labs to keep a check on her Hgb and Hct. She voiced understanding.

## 2013-02-02 NOTE — Telephone Encounter (Signed)
I called and left pt a message that Dr. Rosie Fate has ordered an ultrasound of liver. He also will be checking monthly CBC's. I asked her to call if any questions or concerns.

## 2013-02-09 ENCOUNTER — Other Ambulatory Visit: Payer: Medicare Other

## 2013-02-09 ENCOUNTER — Other Ambulatory Visit: Payer: Self-pay

## 2013-02-09 ENCOUNTER — Ambulatory Visit: Payer: Medicare Other

## 2013-02-09 ENCOUNTER — Ambulatory Visit (HOSPITAL_COMMUNITY)
Admission: RE | Admit: 2013-02-09 | Discharge: 2013-02-09 | Disposition: A | Discharge status: Home / Self Care | Payer: Medicare Other | Source: Ambulatory Visit | Attending: Internal Medicine | Admitting: Internal Medicine

## 2013-02-09 ENCOUNTER — Telehealth: Payer: Self-pay

## 2013-02-09 DIAGNOSIS — K838 Other specified diseases of biliary tract: Secondary | ICD-10-CM | POA: Insufficient documentation

## 2013-02-09 DIAGNOSIS — R1032 Left lower quadrant pain: Secondary | ICD-10-CM

## 2013-02-09 DIAGNOSIS — K8020 Calculus of gallbladder without cholecystitis without obstruction: Secondary | ICD-10-CM | POA: Insufficient documentation

## 2013-02-09 DIAGNOSIS — B182 Chronic viral hepatitis C: Secondary | ICD-10-CM | POA: Insufficient documentation

## 2013-02-09 DIAGNOSIS — I10 Essential (primary) hypertension: Secondary | ICD-10-CM | POA: Insufficient documentation

## 2013-02-09 NOTE — Telephone Encounter (Signed)
Message copied by Gaylord Shih on Mon Feb 09, 2013 12:16 PM ------      Message from: [facility], DAVID      Created: Mon Feb 09, 2013 11:38 AM       Patient inquired about receiving iron.  Her hemoglobin was fine on 09/05.  Her ferritin was skyhigh.  Let's hold for now.  Please let her know.  She will continue her have cbc checked.  ------

## 2013-02-09 NOTE — Telephone Encounter (Signed)
S/w pt about her labs and that Dr Rosie Fate says she does not need feraheme at present. Pt c/o hand being tight and hurting and that she fell the other day. I encouraged her to call her PCP and she said she would.

## 2013-02-10 ENCOUNTER — Telehealth: Payer: Self-pay

## 2013-02-10 NOTE — Telephone Encounter (Signed)
Pt called asking [place] to call the pharmacy to let them know Dr Rosie Fate ordered oxy IR 5 mg rather than percocet that has tylenol in it. When I called the pharmacy I found she had filled percocet 10/325 #120 on 9/5. S/w Dr Rosie Fate and he directed that pt complete this Rx at 1 q6hr prn and then we will switch to oxy IR. I let pt know.

## 2013-02-11 ENCOUNTER — Ambulatory Visit (HOSPITAL_COMMUNITY)
Admission: RE | Admit: 2013-02-11 | Discharge: 2013-02-11 | Disposition: A | Discharge status: Home / Self Care | Payer: Medicare Other | Source: Ambulatory Visit | Attending: Internal Medicine | Admitting: Internal Medicine

## 2013-02-11 ENCOUNTER — Ambulatory Visit (INDEPENDENT_AMBULATORY_CARE_PROVIDER_SITE_OTHER): Payer: Medicare Other | Admitting: Internal Medicine

## 2013-02-11 ENCOUNTER — Encounter: Payer: Self-pay | Admitting: Internal Medicine

## 2013-02-11 VITALS — BP 134/78 | HR 72 | Temp 98.2°F

## 2013-02-11 DIAGNOSIS — M810 Age-related osteoporosis without current pathological fracture: Secondary | ICD-10-CM | POA: Insufficient documentation

## 2013-02-11 DIAGNOSIS — M25569 Pain in unspecified knee: Secondary | ICD-10-CM | POA: Insufficient documentation

## 2013-02-11 DIAGNOSIS — S8991XA Unspecified injury of right lower leg, initial encounter: Secondary | ICD-10-CM

## 2013-02-11 DIAGNOSIS — S8990XA Unspecified injury of unspecified lower leg, initial encounter: Secondary | ICD-10-CM

## 2013-02-11 DIAGNOSIS — M898X9 Other specified disorders of bone, unspecified site: Secondary | ICD-10-CM | POA: Diagnosis not present

## 2013-02-11 DIAGNOSIS — Z23 Encounter for immunization: Secondary | ICD-10-CM | POA: Diagnosis not present

## 2013-02-11 DIAGNOSIS — S99919A Unspecified injury of unspecified ankle, initial encounter: Secondary | ICD-10-CM

## 2013-02-11 DIAGNOSIS — Z299 Encounter for prophylactic measures, unspecified: Secondary | ICD-10-CM

## 2013-02-11 DIAGNOSIS — S99929A Unspecified injury of unspecified foot, initial encounter: Secondary | ICD-10-CM

## 2013-02-11 NOTE — Assessment & Plan Note (Addendum)
Herniated injury happened on 9/11, but she still has pain and a mild swelling. On physical examination, there is a small moveable nodule detected under the skin in right knee joint. It seems to be a small piece of fractured bone. Currently there is no signs of infection. The joint swelling is mild. It is less likely to have hamarthritis given that her INR was normal and she is not on blood thinners.   -will treat symptomatically for pain. Patient is on oxycodone at home chronically. -Will get 4 views x-ray for further evaluation of her knee injury. -Will re-evaluate in one week.

## 2013-02-11 NOTE — Progress Notes (Addendum)
Patient ID: [name], female   DOB: 09-26-1950, 62 y.o.   MRN: [id number] Subjective:   Patient ID: [name] female   DOB: 27-Aug-1950 62 y.o.   MRN: [id number]  CC:   Acute visit due to right knee pain after injury HPI:  [name] is a 63 y.o. lady with past medical history as outlined below, who presents for an acute visit today.  Patient has hx of left leg above knee amputation. She is using manual wheelchair and walker at home. Patient reports that she fell off the wheelchair in the evening of 9/11 and injured her right knee. The axis screw of her right front wheelchair wheel was loose and she did not notice it.  Her wheelchair turned over to the right side after the small front wheel fell off when she tried to use bathroom. She fell off the wheelchair with right knee landing on the floor. She immediately start having pain. Later on her right knee became mildly swelling. She also reports that her left thumb was slightly injured when she tried to grab the edge of sink when the event happed. Currently she has pain in her right knee, but still can put weight on that leg. No limitation for ROM. No fever or chills. Her left thumb pain is tolerable, which does not bother her much. Patient is currently taking aspirin. She is not on other blood thinners. Her previous and INR was 1.1 on 02/16/12  ROS:  Denies fever, chills, headaches, cough, chest pain, SOB,  abdominal pain, diarrhea, constipation, dysuria, urgency, frequency, hematuria.    Past Medical History  Diagnosis Date  . Hypertension   . Colon polyps   . Stomach problems   . Hypokalemic alkalosis   . Allergic rhinitis   . GERD (gastroesophageal reflux disease)   . Stroke 2010    no residual problems  . Shortness of breath     due to anemia  . Anemia, iron deficiency 05/03/2011    recieves iron infusions  . Anemia   . GI bleeding   . Diverticulitis   . Transfusion history   . Difficulty sleeping     takes trazadone  for sleep  . Peripheral vascular disease    Current Outpatient Prescriptions  Medication Sig Dispense Refill  . aspirin EC 81 MG tablet Take 2 tablets (162 mg total) by mouth 2 (two) times daily.  31 tablet  1  . atenolol (TENORMIN) 50 MG tablet TAKE 1 TABLET BY MOUTH EVERY MORNING  30 tablet  11  . cetirizine (ZYRTEC) 10 MG tablet Take 1 tablet (10 mg total) by mouth daily.  90 tablet  1  . esomeprazole (NEXIUM) 40 MG capsule Take 1 capsule (40 mg total) by mouth 2 (two) times daily.  30 capsule  12  . FLUoxetine (PROZAC) 20 MG capsule Take 1 capsule (20 mg total) by mouth every morning.  30 capsule  3  . gabapentin (NEURONTIN) 400 MG capsule TAKE 2 CAPSULE BY MOUTH THREE TIMES A DAY  90 capsule  3  . hydrochlorothiazide (HYDRODIURIL) 25 MG tablet Take 1 tablet (25 mg total) by mouth daily.  30 tablet  3  . lisinopril (PRINIVIL,ZESTRIL) 40 MG tablet TAKE 1 TABLET BY MOUTH EVERY MORNING  30 tablet  11  . oxyCODONE (OXY IR/ROXICODONE) 5 MG immediate release tablet Take 1 tablet (5 mg total) by mouth every 4 (four) hours as needed for pain.  90 tablet  0  . potassium chloride 20  MEQ/15ML (10%) solution TAKE BY MOUTH DAILY  240 mL  2  . Specialty Vitamins Products (MAGNESIUM, AMINO ACID CHELATE,) 133 MG tablet Take 1 tablet by mouth 2 (two) times daily.      . traMADol (ULTRAM) 50 MG tablet Take 2 tablets (100 mg total) by mouth every 12 (twelve) hours as needed. For pain.  65 tablet  1  . traZODone (DESYREL) 100 MG tablet TAKE 1 TABLET BY MOUTH EVERY NIGHT AT BEDTIME  30 tablet  1  . vitamin C (ASCORBIC ACID) 500 MG tablet Take 1 tablet (500 mg total) by mouth daily.  30 tablet  12   No current facility-administered medications for this visit.   Family History  Problem Relation Age of Onset  . Cancer Father     unknown  . Hypertension Mother   . Diverticulitis Mother    History   Social History  . Marital Status: Widowed    Spouse Name: N/A    Number of Children: N/A  . Years  of Education: N/A   Social History Main Topics  . Smoking status: Current Every Day Smoker -- 0.20 packs/day    Types: Cigarettes  . Smokeless tobacco: Never Used     Comment: wants to quit  . Alcohol Use: Yes     Comment: Occasionally  . Drug Use: No  . Sexual Activity: None   Other Topics Concern  . None   Social History Narrative  . None    Review of Systems: Full 14-point review of systems otherwise negative. See HPI.   Objective:  Physical Exam: Filed Vitals:   02/11/13 1509  BP: 134/78  Pulse: 72  Temp: 98.2 F (36.8 C)  TempSrc: Oral  SpO2: 98%   General: sitting in the wheelchair, in no acute distress. HEENT: PERRL, EOMI, no scleral icterus Cardiac: RRR, no rubs, murmurs or gallops Pulm: clear to auscultation bilaterally, moving normal volumes of air Abd: soft, nontender, nondistended, BS present, there is a ventral hernia slightly ttp. Ext: there is large bruise area over the right knee joint. There is tenderness over the medial side of knee joint. It is minimally swelling (can not compare to the left side due to AKA). There is tenderness upon internal rotation. No ROM limitation upon other directions. There is a small moveable nodule was detected under the skin in right knee joint. It seems to be a small piece of fractured bone. Left side AKA. There is mild tenderness over the base of left thumb, no swelling or limitation of ROM.  Neuro: alert and oriented X3, cranial nerves II-XII grossly intact    Assessment & Plan:   Addendum 02/12/13:  Patient's right knee x-ray-4 view showed osteoporosis, but no fracture or effusion. Patient will be followed up in next week.   Lorretta Harp, MD PGY3, Internal Medicine Teaching Service Pager: [phone]

## 2013-02-11 NOTE — Patient Instructions (Signed)
1. You can take your home pain medications for your knee pain.   2. We will get X-ray for your right knee to rule out any bone fracture. 3. If you have worsening of your symptoms or new symptoms arise, please call the clinic ([phone]), or go to the ER immediately if symptoms are severe.  Knee Pain The knee is the complex joint between your thigh and your lower leg. It is made up of bones, tendons, ligaments, and cartilage. The bones that make up the knee are:  The femur in the thigh.  The tibia and fibula in the lower leg.  The patella or kneecap riding in the groove on the lower femur. CAUSES  Knee pain is a common complaint with many causes. A few of these causes are:  Injury, such as:  A ruptured ligament or tendon injury.  Torn cartilage.  Medical conditions, such as:  Gout  Arthritis  Infections  Overuse, over training or overdoing a physical activity. Knee pain can be minor or severe. Knee pain can accompany debilitating injury. Minor knee problems often respond well to self-care measures or get well on their own. More serious injuries may need medical intervention or even surgery. SYMPTOMS The knee is complex. Symptoms of knee problems can vary widely. Some of the problems are:  Pain with movement and weight bearing.  Swelling and tenderness.  Buckling of the knee.  Inability to straighten or extend your knee.  Your knee locks and you cannot straighten it.  Warmth and redness with pain and fever.  Deformity or dislocation of the kneecap. DIAGNOSIS  Determining what is wrong may be very straight forward such as when there is an injury. It can also be challenging because of the complexity of the knee. Tests to make a diagnosis may include:  Your caregiver taking a history and doing a physical exam.  Routine X-rays can be used to rule out other problems. X-rays will not reveal a cartilage tear. Some injuries of the knee can be diagnosed by:  Arthroscopy a  surgical technique by which a small video camera is inserted through tiny incisions on the sides of the knee. This procedure is used to examine and repair internal knee joint problems. Tiny instruments can be used during arthroscopy to repair the torn knee cartilage (meniscus).  Arthrography is a radiology technique. A contrast liquid is directly injected into the knee joint. Internal structures of the knee joint then become visible on X-ray film.  An MRI scan is a non x-ray radiology procedure in which magnetic fields and a computer produce two- or three-dimensional images of the inside of the knee. Cartilage tears are often visible using an MRI scanner. MRI scans have largely replaced arthrography in diagnosing cartilage tears of the knee.  Blood work.  Examination of the fluid that helps to lubricate the knee joint (synovial fluid). This is done by taking a sample out using a needle and a syringe. TREATMENT The treatment of knee problems depends on the cause. Some of these treatments are:  Depending on the injury, proper casting, splinting, surgery or physical therapy care will be needed.  Give yourself adequate recovery time. Do not overuse your joints. If you begin to get sore during workout routines, back off. Slow down or do fewer repetitions.  For repetitive activities such as cycling or running, maintain your strength and nutrition.  Alternate muscle groups. For example if you are a weight lifter, work the upper body on one day and the lower  body the next.  Either tight or weak muscles do not give the proper support for your knee. Tight or weak muscles do not absorb the stress placed on the knee joint. Keep the muscles surrounding the knee strong.  Take care of mechanical problems.  If you have flat feet, orthotics or special shoes may help. See your caregiver if you need help.  Arch supports, sometimes with wedges on the inner or outer aspect of the heel, can help. These can shift  pressure away from the side of the knee most bothered by osteoarthritis.  A brace called an "unloader" brace also may be used to help ease the pressure on the most arthritic side of the knee.  If your caregiver has prescribed crutches, braces, wraps or ice, use as directed. The acronym for this is PRICE. This means protection, rest, ice, compression and elevation.  Nonsteroidal anti-inflammatory drugs (NSAID's), can help relieve pain. But if taken immediately after an injury, they may actually increase swelling. Take NSAID's with food in your stomach. Stop them if you develop stomach problems. Do not take these if you have a history of ulcers, stomach pain or bleeding from the bowel. Do not take without your caregiver's approval if you have problems with fluid retention, heart failure, or kidney problems.  For ongoing knee problems, physical therapy may be helpful.  Glucosamine and chondroitin are over-the-counter dietary supplements. Both may help relieve the pain of osteoarthritis in the knee. These medicines are different from the usual anti-inflammatory drugs. Glucosamine may decrease the rate of cartilage destruction.  Injections of a corticosteroid drug into your knee joint may help reduce the symptoms of an arthritis flare-up. They may provide pain relief that lasts a few months. You may have to wait a few months between injections. The injections do have a small increased risk of infection, water retention and elevated blood sugar levels.  Hyaluronic acid injected into damaged joints may ease pain and provide lubrication. These injections may work by reducing inflammation. A series of shots may give relief for as long as 6 months.  Topical painkillers. Applying certain ointments to your skin may help relieve the pain and stiffness of osteoarthritis. Ask your pharmacist for suggestions. Many over the-counter products are approved for temporary relief of arthritis pain.  In some countries,  doctors often prescribe topical NSAID's for relief of chronic conditions such as arthritis and tendinitis. A review of treatment with NSAID creams found that they worked as well as oral medications but without the serious side effects. PREVENTION  Maintain a healthy weight. Extra pounds put more strain on your joints.  Get strong, stay limber. Weak muscles are a common cause of knee injuries. Stretching is important. Include flexibility exercises in your workouts.  Be smart about exercise. If you have osteoarthritis, chronic knee pain or recurring injuries, you may need to change the way you exercise. This does not mean you have to stop being active. If your knees ache after jogging or playing basketball, consider switching to swimming, water aerobics or other low-impact activities, at least for a few days a week. Sometimes limiting high-impact activities will provide relief.  Make sure your shoes fit well. Choose footwear that is right for your sport.  Protect your knees. Use the proper gear for knee-sensitive activities. Use kneepads when playing volleyball or laying carpet. Buckle your seat belt every time you drive. Most shattered kneecaps occur in car accidents.  Rest when you are tired. SEEK MEDICAL CARE IF:  You have  knee pain that is continual and does not seem to be getting better.  SEEK IMMEDIATE MEDICAL CARE IF:  Your knee joint feels hot to the touch and you have a high fever. MAKE SURE YOU:   Understand these instructions.  Will watch your condition.  Will get help right away if you are not doing well or get worse. Document Released: 03/11/2007 Document Revised: 08/06/2011 Document Reviewed: 03/11/2007 [facility] Patient Information 2014 [place], [place].

## 2013-02-12 NOTE — Progress Notes (Signed)
Case discussed with Dr. Niu at the time of the visit.  We reviewed the resident's history and exam and pertinent patient test results.  I agree with the assessment, diagnosis, and plan of care documented in the resident's note.    

## 2013-02-16 ENCOUNTER — Other Ambulatory Visit: Payer: Medicare Other

## 2013-02-17 ENCOUNTER — Other Ambulatory Visit: Payer: Self-pay | Admitting: Internal Medicine

## 2013-02-18 ENCOUNTER — Encounter (INDEPENDENT_AMBULATORY_CARE_PROVIDER_SITE_OTHER): Payer: Self-pay | Admitting: Surgery

## 2013-02-18 ENCOUNTER — Ambulatory Visit (INDEPENDENT_AMBULATORY_CARE_PROVIDER_SITE_OTHER): Payer: Medicare Other | Admitting: Surgery

## 2013-02-18 VITALS — BP 122/80 | HR 72 | Temp 98.1°F | Resp 14 | Ht 63.0 in | Wt 178.4 lb

## 2013-02-18 DIAGNOSIS — K432 Incisional hernia without obstruction or gangrene: Secondary | ICD-10-CM | POA: Diagnosis not present

## 2013-02-18 NOTE — Progress Notes (Signed)
Subjective:     Patient ID: [name], female   DOB: [birth year], 62 y.o.   MRN: [id number]  HPI She is here for another evaluation of her incisional hernia. She has mild discomfort from time to time but no obstructive symptoms. She has fallen recently and her right knee. Her x-rays were unremarkable.  Review of Systems     Objective:   Physical Exam On exam, there is an incisional hernia which is easily reducible and her abdomen is nontender. She still has marked swelling at her right knee    Assessment:     Incisional hernia     Plan:     We again discussed repair versus expectant management. She is leaning toward repair with mesh which I would need to do open. She is to see me back in 2 months. If she decides before then that surgery we will schedule it

## 2013-02-20 ENCOUNTER — Other Ambulatory Visit: Payer: Self-pay | Admitting: *Deleted

## 2013-02-20 ENCOUNTER — Ambulatory Visit (INDEPENDENT_AMBULATORY_CARE_PROVIDER_SITE_OTHER): Payer: Medicare Other | Admitting: Internal Medicine

## 2013-02-20 VITALS — BP 129/83 | HR 82 | Temp 98.4°F | Ht 63.0 in | Wt 180.9 lb

## 2013-02-20 DIAGNOSIS — K439 Ventral hernia without obstruction or gangrene: Secondary | ICD-10-CM | POA: Diagnosis not present

## 2013-02-20 DIAGNOSIS — Z760 Encounter for issue of repeat prescription: Secondary | ICD-10-CM

## 2013-02-20 MED ORDER — OXYCODONE-ACETAMINOPHEN 10-325 MG PO TABS: 1.0000 | ORAL_TABLET | ORAL | Status: DC | PRN

## 2013-02-22 NOTE — Progress Notes (Signed)
Subjective:   Patient ID: [name] female   DOB: 25-May-1951 62 y.o.   MRN: [id number]  HPI: PatriciaEmary D [name] is a 62 y.o. AA woman with complicated medical hx here for medication refill. Pt is contemplating mesh abdominal surgery for large expanding abdominal hernia. She still has not decided to pursue the surgery or not.   The risks and benefits and review of her surgeon Dr. Eliberto Ivory note was had with the patient.   The pt denied any increased abdominal pain from her baseline, no change in bowel habits, bloody/black tarry stools, or pain with defecation. Pt has no CP, SOB, nausea/vomiting/diarrhea, or weight loss.     Past Medical History  Diagnosis Date  . Hypertension   . Colon polyps   . Stomach problems   . Hypokalemic alkalosis   . Allergic rhinitis   . GERD (gastroesophageal reflux disease)   . Stroke 2010    no residual problems  . Shortness of breath     due to anemia  . Anemia, iron deficiency 05/03/2011    recieves iron infusions  . Anemia   . GI bleeding   . Diverticulitis   . Transfusion history   . Difficulty sleeping     takes trazadone for sleep  . Peripheral vascular disease    Current Outpatient Prescriptions  Medication Sig Dispense Refill  . aspirin EC 81 MG tablet Take 2 tablets (162 mg total) by mouth 2 (two) times daily.  31 tablet  1  . atenolol (TENORMIN) 50 MG tablet TAKE 1 TABLET BY MOUTH EVERY MORNING  30 tablet  11  . cetirizine (ZYRTEC) 10 MG tablet Take 1 tablet (10 mg total) by mouth daily.  90 tablet  1  . esomeprazole (NEXIUM) 40 MG capsule Take 1 capsule (40 mg total) by mouth 2 (two) times daily.  30 capsule  12  . FLUoxetine (PROZAC) 20 MG capsule Take 1 capsule (20 mg total) by mouth every morning.  30 capsule  3  . gabapentin (NEURONTIN) 400 MG capsule TAKE 2 CAPSULE BY MOUTH THREE TIMES A DAY  90 capsule  3  . hydrochlorothiazide (HYDRODIURIL) 25 MG tablet Take 1 tablet (25 mg total) by mouth daily.  30 tablet  3  .  lisinopril (PRINIVIL,ZESTRIL) 40 MG tablet TAKE 1 TABLET BY MOUTH EVERY MORNING  30 tablet  11  . oxyCODONE (OXY IR/ROXICODONE) 5 MG immediate release tablet Take 1 tablet (5 mg total) by mouth every 4 (four) hours as needed for pain.  90 tablet  0  . oxyCODONE-acetaminophen (PERCOCET) 10-325 MG per tablet Take 1 tablet by mouth every 4 (four) hours as needed for pain.  120 tablet  0  . potassium chloride 20 MEQ/15ML (10%) solution TAKE BY MOUTH DAILY  240 mL  2  . Specialty Vitamins Products (MAGNESIUM, AMINO ACID CHELATE,) 133 MG tablet Take 1 tablet by mouth 2 (two) times daily.      . traMADol (ULTRAM) 50 MG tablet Take 2 tablets (100 mg total) by mouth every 12 (twelve) hours as needed. For pain.  65 tablet  1  . traZODone (DESYREL) 100 MG tablet TAKE 1 TABLET BY MOUTH EVERY NIGHT AT BEDTIME  30 tablet  1  . vitamin C (ASCORBIC ACID) 500 MG tablet Take 1 tablet (500 mg total) by mouth daily.  30 tablet  12   No current facility-administered medications for this visit.   Family History  Problem Relation Age of Onset  . Cancer Father  unknown  . Hypertension Mother   . Diverticulitis Mother    History   Social History  . Marital Status: Widowed    Spouse Name: N/A    Number of Children: N/A  . Years of Education: N/A   Social History Main Topics  . Smoking status: Current Every Day Smoker -- 0.20 packs/day    Types: Cigarettes  . Smokeless tobacco: Never Used     Comment: wants to quit  . Alcohol Use: Yes     Comment: Occasionally  . Drug Use: No  . Sexual Activity: Not on file   Other Topics Concern  . Not on file   Social History Narrative  . No narrative on file   Review of Systems: otherwise negative unless listed in HPI  Objective:  Physical Exam: Filed Vitals:   02/20/13 1521  BP: 129/83  Pulse: 82  Temp: 98.4 F (36.9 C)  TempSrc: Oral  Height: 5\' 3"  (1.6 m)  Weight: 180 lb 14.4 oz (82.056 kg)  SpO2: 99%   General: sitting in  wheelchair HEENT: PERRL, EOMI, no scleral icterus Cardiac: RRR, no rubs, murmurs or gallops Pulm: clear to auscultation bilaterally, moving normal volumes of air Abd: soft, nontender, nondistended, BS present, 2x2 inch pink granulation tissue in large midabdominal incision, large easily reducible abdominal hernia Ext: warm and well perfused, no pedal edema Neuro: alert and oriented X3, cranial nerves II-XII grossly intact   Assessment & Plan:  1. Medication refill: pt has pain contract for nonhealing abdominal wound.  -refill percocet  2. Non healing abdominal wound: pt still contemplating surgery with Dr. Magnus Ivan.   Pt discussed with Dr. Aundria Rud

## 2013-02-23 ENCOUNTER — Other Ambulatory Visit: Payer: Medicare Other

## 2013-02-27 DIAGNOSIS — H2589 Other age-related cataract: Secondary | ICD-10-CM | POA: Diagnosis not present

## 2013-02-27 DIAGNOSIS — H40019 Open angle with borderline findings, low risk, unspecified eye: Secondary | ICD-10-CM | POA: Diagnosis not present

## 2013-02-27 DIAGNOSIS — H547 Unspecified visual loss: Secondary | ICD-10-CM | POA: Diagnosis not present

## 2013-03-04 DIAGNOSIS — H53459 Other localized visual field defect, unspecified eye: Secondary | ICD-10-CM | POA: Diagnosis not present

## 2013-03-11 ENCOUNTER — Other Ambulatory Visit: Payer: Self-pay | Admitting: Internal Medicine

## 2013-03-17 ENCOUNTER — Other Ambulatory Visit: Payer: Self-pay | Admitting: Internal Medicine

## 2013-03-20 ENCOUNTER — Other Ambulatory Visit: Payer: Self-pay | Admitting: *Deleted

## 2013-03-20 DIAGNOSIS — R1032 Left lower quadrant pain: Secondary | ICD-10-CM

## 2013-03-20 MED ORDER — OXYCODONE-ACETAMINOPHEN 10-325 MG PO TABS: 1.0000 | ORAL_TABLET | Freq: Four times a day (QID) | ORAL | Status: DC | PRN

## 2013-03-20 NOTE — Telephone Encounter (Signed)
We only have her on pain contract for the percocet.   Regardless, she should not be taking two short acting narcotics at the same time.  I will refuse the Oxy IR.    Signed  Debe Coder, MD

## 2013-03-30 DIAGNOSIS — H2510 Age-related nuclear cataract, unspecified eye: Secondary | ICD-10-CM | POA: Diagnosis not present

## 2013-03-30 DIAGNOSIS — H2589 Other age-related cataract: Secondary | ICD-10-CM | POA: Diagnosis not present

## 2013-03-31 DIAGNOSIS — Z961 Presence of intraocular lens: Secondary | ICD-10-CM | POA: Diagnosis not present

## 2013-04-10 ENCOUNTER — Telehealth: Payer: Self-pay | Admitting: *Deleted

## 2013-04-10 NOTE — Telephone Encounter (Signed)
The gabapentin is 2 by mouth 3 times daily which would be 180/ per month, it was sent to pharmacy as #90, please change this in the med list and you may want to send a new script electronically to [facility]

## 2013-04-14 ENCOUNTER — Other Ambulatory Visit: Payer: Self-pay | Admitting: Internal Medicine

## 2013-04-18 DIAGNOSIS — H2510 Age-related nuclear cataract, unspecified eye: Secondary | ICD-10-CM | POA: Diagnosis not present

## 2013-04-20 DIAGNOSIS — H2510 Age-related nuclear cataract, unspecified eye: Secondary | ICD-10-CM | POA: Diagnosis not present

## 2013-04-20 DIAGNOSIS — H269 Unspecified cataract: Secondary | ICD-10-CM | POA: Diagnosis not present

## 2013-04-21 ENCOUNTER — Encounter (INDEPENDENT_AMBULATORY_CARE_PROVIDER_SITE_OTHER): Payer: Medicare Other | Admitting: Surgery

## 2013-04-28 ENCOUNTER — Other Ambulatory Visit: Payer: Self-pay | Admitting: *Deleted

## 2013-04-29 MED ORDER — OXYCODONE-ACETAMINOPHEN 10-325 MG PO TABS: 1.0000 | ORAL_TABLET | Freq: Four times a day (QID) | ORAL | Status: DC | PRN

## 2013-05-07 ENCOUNTER — Encounter (INDEPENDENT_AMBULATORY_CARE_PROVIDER_SITE_OTHER): Payer: Medicare Other | Admitting: Surgery

## 2013-05-08 ENCOUNTER — Encounter (INDEPENDENT_AMBULATORY_CARE_PROVIDER_SITE_OTHER): Payer: Self-pay | Admitting: Surgery

## 2013-05-08 ENCOUNTER — Ambulatory Visit (INDEPENDENT_AMBULATORY_CARE_PROVIDER_SITE_OTHER): Payer: Medicare Other | Admitting: Surgery

## 2013-05-08 VITALS — BP 144/90 | HR 68 | Temp 98.3°F | Resp 14 | Ht 63.0 in | Wt 181.8 lb

## 2013-05-08 DIAGNOSIS — K432 Incisional hernia without obstruction or gangrene: Secondary | ICD-10-CM | POA: Diagnosis not present

## 2013-05-08 NOTE — Progress Notes (Signed)
Subjective:     Patient ID: [name], female   DOB: [birth year], 62 y.o.   MRN: [id number]  HPI She is here today to again evaluate her incisional hernia. She continues to have some abdominal discomfort from the hernia but no obstructive symptoms.  Review of Systems     Objective:   Physical Exam On exam, there is an easily reducible large incisional hernia. The chronic wound has improved significantly on the abdominal wall    Assessment:     Incisional hernia     Plan:     She now think she is ready to go ahead and proceed with open repair with mesh. I discussed this with her in detail and we will schedule her for surgery in January

## 2013-05-29 ENCOUNTER — Other Ambulatory Visit: Payer: Self-pay | Admitting: *Deleted

## 2013-05-29 MED ORDER — OXYCODONE-ACETAMINOPHEN 10-325 MG PO TABS: 1.0000 | ORAL_TABLET | Freq: Four times a day (QID) | ORAL | Status: DC | PRN

## 2013-06-01 ENCOUNTER — Telehealth: Payer: Self-pay | Admitting: Internal Medicine

## 2013-06-01 ENCOUNTER — Ambulatory Visit (HOSPITAL_BASED_OUTPATIENT_CLINIC_OR_DEPARTMENT_OTHER): Payer: Medicare Other | Admitting: Internal Medicine

## 2013-06-01 ENCOUNTER — Other Ambulatory Visit: Payer: Self-pay | Admitting: Medical Oncology

## 2013-06-01 ENCOUNTER — Ambulatory Visit (HOSPITAL_BASED_OUTPATIENT_CLINIC_OR_DEPARTMENT_OTHER): Payer: Medicare Other

## 2013-06-01 ENCOUNTER — Other Ambulatory Visit (HOSPITAL_BASED_OUTPATIENT_CLINIC_OR_DEPARTMENT_OTHER): Payer: Medicare Other

## 2013-06-01 ENCOUNTER — Encounter (HOSPITAL_COMMUNITY): Payer: Self-pay

## 2013-06-01 VITALS — BP 128/71 | HR 82 | Temp 98.3°F | Resp 18 | Ht 63.0 in | Wt 184.6 lb

## 2013-06-01 DIAGNOSIS — D500 Iron deficiency anemia secondary to blood loss (chronic): Secondary | ICD-10-CM | POA: Diagnosis not present

## 2013-06-01 DIAGNOSIS — R740 Nonspecific elevation of levels of transaminase and lactic acid dehydrogenase [LDH]: Secondary | ICD-10-CM

## 2013-06-01 DIAGNOSIS — K432 Incisional hernia without obstruction or gangrene: Secondary | ICD-10-CM

## 2013-06-01 DIAGNOSIS — R7401 Elevation of levels of liver transaminase levels: Secondary | ICD-10-CM | POA: Diagnosis not present

## 2013-06-01 DIAGNOSIS — R7402 Elevation of levels of lactic acid dehydrogenase (LDH): Secondary | ICD-10-CM | POA: Diagnosis not present

## 2013-06-01 LAB — CBC & DIFF AND RETIC
BASO%: 0.3 % (ref 0.0–2.0)
BASOS ABS: 0 10*3/uL (ref 0.0–0.1)
EOS ABS: 0.1 10*3/uL (ref 0.0–0.5)
EOS%: 1.2 % (ref 0.0–7.0)
HEMATOCRIT: 24.1 % — AB (ref 34.8–46.6)
HEMOGLOBIN: 7.2 g/dL — AB (ref 11.6–15.9)
IMMATURE RETIC FRACT: 26.3 % — AB (ref 1.60–10.00)
LYMPH#: 2.6 10*3/uL (ref 0.9–3.3)
LYMPH%: 29.1 % (ref 14.0–49.7)
MCH: 23.2 pg — ABNORMAL LOW (ref 25.1–34.0)
MCHC: 29.9 g/dL — AB (ref 31.5–36.0)
MCV: 77.5 fL — ABNORMAL LOW (ref 79.5–101.0)
MONO#: 1.1 10*3/uL — AB (ref 0.1–0.9)
MONO%: 11.6 % (ref 0.0–14.0)
NEUT%: 57.8 % (ref 38.4–76.8)
NEUTROS ABS: 5.2 10*3/uL (ref 1.5–6.5)
PLATELETS: 396 10*3/uL (ref 145–400)
RBC: 3.11 10*6/uL — ABNORMAL LOW (ref 3.70–5.45)
RDW: 18 % — ABNORMAL HIGH (ref 11.2–14.5)
RETIC CT ABS: 94.54 10*3/uL — AB (ref 33.70–90.70)
Retic %: 3.04 % — ABNORMAL HIGH (ref 0.70–2.10)
WBC: 9.1 10*3/uL (ref 3.9–10.3)
nRBC: 0 % (ref 0–0)

## 2013-06-01 LAB — COMPREHENSIVE METABOLIC PANEL (CC13)
ALBUMIN: 3.2 g/dL — AB (ref 3.5–5.0)
ALT: 42 U/L (ref 0–55)
AST: 48 U/L — AB (ref 5–34)
Alkaline Phosphatase: 101 U/L (ref 40–150)
Anion Gap: 11 mEq/L (ref 3–11)
BUN: 11.1 mg/dL (ref 7.0–26.0)
CALCIUM: 8.5 mg/dL (ref 8.4–10.4)
CHLORIDE: 103 meq/L (ref 98–109)
CO2: 22 mEq/L (ref 22–29)
Creatinine: 0.9 mg/dL (ref 0.6–1.1)
Glucose: 304 mg/dl — ABNORMAL HIGH (ref 70–140)
POTASSIUM: 3.7 meq/L (ref 3.5–5.1)
SODIUM: 136 meq/L (ref 136–145)
TOTAL PROTEIN: 6.6 g/dL (ref 6.4–8.3)
Total Bilirubin: 0.32 mg/dL (ref 0.20–1.20)

## 2013-06-01 LAB — IRON AND TIBC CHCC
%SAT: 3 % — AB (ref 21–57)
IRON: 16 ug/dL — AB (ref 41–142)
TIBC: 501 ug/dL — ABNORMAL HIGH (ref 236–444)
UIBC: 485 ug/dL — ABNORMAL HIGH (ref 120–384)

## 2013-06-01 LAB — LACTATE DEHYDROGENASE (CC13): LDH: 162 U/L (ref 125–245)

## 2013-06-01 LAB — FERRITIN CHCC: Ferritin: 10 ng/ml (ref 9–269)

## 2013-06-01 LAB — TECHNOLOGIST REVIEW

## 2013-06-01 MED ORDER — SODIUM CHLORIDE 0.9 % IJ SOLN
3.0000 mL | Freq: Once | INTRAMUSCULAR | Status: DC | PRN
  Filled 2013-06-01: qty 10

## 2013-06-01 MED ORDER — METHYLPREDNISOLONE SODIUM SUCC 40 MG IJ SOLR
INTRAMUSCULAR | Status: AC
  Filled 2013-06-01: qty 2

## 2013-06-01 MED ORDER — DIPHENHYDRAMINE HCL 50 MG/ML IJ SOLN
INTRAMUSCULAR | Status: AC
  Filled 2013-06-01: qty 1

## 2013-06-01 MED ORDER — DIPHENHYDRAMINE HCL 50 MG/ML IJ SOLN
50.0000 mg | Freq: Once | INTRAMUSCULAR | Status: AC
  Administered 2013-06-01: 50 mg via INTRAVENOUS

## 2013-06-01 MED ORDER — METHYLPREDNISOLONE SODIUM SUCC 125 MG IJ SOLR
INTRAMUSCULAR | Status: AC
  Filled 2013-06-01: qty 2

## 2013-06-01 MED ORDER — FAMOTIDINE IN NACL 20-0.9 MG/50ML-% IV SOLN
INTRAVENOUS | Status: AC
  Filled 2013-06-01: qty 50

## 2013-06-01 MED ORDER — METHYLPREDNISOLONE SODIUM SUCC 125 MG IJ SOLR
80.0000 mg | Freq: Once | INTRAMUSCULAR | Status: AC
  Administered 2013-06-01: 80 mg via INTRAVENOUS

## 2013-06-01 MED ORDER — FAMOTIDINE IN NACL 20-0.9 MG/50ML-% IV SOLN
20.0000 mg | Freq: Once | INTRAVENOUS | Status: AC
  Administered 2013-06-01: 20 mg via INTRAVENOUS

## 2013-06-01 MED ORDER — SODIUM CHLORIDE 0.9 % IV SOLN
Freq: Once | INTRAVENOUS | Status: AC
  Administered 2013-06-01: 17:00:00 via INTRAVENOUS

## 2013-06-01 MED ORDER — SODIUM CHLORIDE 0.9 % IV SOLN
1020.0000 mg | Freq: Once | INTRAVENOUS | Status: AC
  Administered 2013-06-01: 1020 mg via INTRAVENOUS
  Filled 2013-06-01: qty 34

## 2013-06-01 NOTE — Patient Instructions (Signed)

## 2013-06-01 NOTE — Telephone Encounter (Signed)
appts made per 06/01/13 [facility] AVS and CAL given shh

## 2013-06-01 NOTE — Progress Notes (Signed)
Hematology and Oncology Follow Up Visit  [name] [id number] 1951/03/28 63 y.o. Patricia Gallant, MD  Chief Complaint: Recurrent iron-deficiency anemia secondary to GI bleeding.   Principle Diagnosis:  Recurrent iron-deficiency anemia secondary to gastrointestinal bleeding.  Prior Therapy:   The patient has been heavily dependent upon  IV Feraheme. Over the past year or so she has been receiving about 1 Feraheme infusion monthly on average. The patient's most recent infusions of IV Feraheme 510 mg occurred on the following dates: 06/05/2011, 07/03/2011, 07/31/2011, 08/17/2011, 08/28/2011. The patient received IV Feraheme 1020 mg on 10/02/2011 01/30/2012, 06/06/2012 and 09/10/2012.   Current therapy:  As noted above.   Feraheme 1020 mg prn.   Interim History:  [name] was seen today for followup of her recurrent iron-deficiency anemia due to GI bleeding.  GI bleeding is probably dueto AV malformations and diverticulosis.  The patient was last seen by me on 01/30/2013.  We have been following her CBC and ferritin on a monthly basis except for the time when she was hospitalized.  Since August 2013, the patient required IV Feraheme 1020 as noted above. Today, [name] is here today with her caregiver, Freada Bergeron, who is employed by [employer].  The patient apparently has been under their supervision since 2012.    The patient is here in a wheelchair but gets around at home with a walker.  She lives alone.  She has undergone a left above-the-knee amputation due to necrotizing fasciitis in October 2010.  Her abdominal wound has healed but she still has a large hernia that may require surgery at some point.  She reports pagophagia but denies hematochezia or melena.   Medications: I have reviewed the patient's current medications.  Current Outpatient Prescriptions  Medication Sig Dispense Refill  . aspirin EC 81 MG tablet Take 2 tablets (162 mg total) by mouth 2 (two) times daily.  31 tablet  1   . esomeprazole (NEXIUM) 40 MG capsule Take 1 capsule (40 mg total) by mouth 2 (two) times daily.  30 capsule  12  . FLUoxetine (PROZAC) 20 MG capsule Take 1 capsule (20 mg total) by mouth every morning.  30 capsule  3  . hydrochlorothiazide (HYDRODIURIL) 25 MG tablet Take 1 tablet (25 mg total) by mouth daily.  30 tablet  3  . Specialty Vitamins Products (MAGNESIUM, AMINO ACID CHELATE,) 133 MG tablet Take 1 tablet by mouth 2 (two) times daily.      . traMADol (ULTRAM) 50 MG tablet Take 2 tablets (100 mg total) by mouth every 12 (twelve) hours as needed. For pain.  65 tablet  1  . vitamin C (ASCORBIC ACID) 500 MG tablet Take 1 tablet (500 mg total) by mouth daily.  30 tablet  12  . atenolol (TENORMIN) 50 MG tablet Take 50 mg by mouth daily.      . cetirizine (ZYRTEC) 10 MG tablet Take 10 mg by mouth daily.      Marland Kitchen gabapentin (NEURONTIN) 400 MG capsule Take 800 mg by mouth 3 (three) times daily.      Marland Kitchen lisinopril (PRINIVIL,ZESTRIL) 40 MG tablet Take 40 mg by mouth daily.      . potassium chloride 20 MEQ/15ML (10%) solution Take 20 mEq by mouth daily.      . traZODone (DESYREL) 100 MG tablet Take 100 mg by mouth at bedtime.       No current facility-administered medications for this visit.   SMOKING HISTORY: The patient smoked up to a  pack and a half of cigarettes a day from age 57 and continues to smoke a couple of  cigarettes a day at the present time.  Allergies:  Allergies  Allergen Reactions  . Ciprofloxacin Hives and Swelling  . Morphine And Related Other (See Comments)    Makes pt sad and paranoid  . Penicillins Hives and Swelling  . Codeine Hives and Swelling  . Feraheme [Ferumoxytol] Itching    Tolerated Feraheme 6/26 & 7/21    PROBLEM LIST:  1. Recurrent iron-deficiency anemia secondary to gastrointestinal bleeding (as noted above). [name] also has required red cell transfusions in the past, most  recently early December 2012. She underwent a small bowel enteroscopy by  Dr. Dan Humphreys at [facility] on 08/13/2011. The enteroscope was passed up to 6 feet into the jejunum. He saw multiple AVMs, which were ablated. Two of the large AVMs bled during APC. These were in the 4th part of the duodenum very close to the ligament of Treitz. The remainder of the AVMs were in the  proximal jejunum. In addition to the small bowel AV malformations, which were treated with APC, he saw some small esophageal varices which were not bleeding and also evidence of mild portal hypertensive gastropathy.  2. History of duodenal arteriovenous malformation.  3. History of hepatitis C infection.  4. Gastroesophageal reflux disease.  5. Depression.  6. Hypertension.  7. History of right Bell's palsy.  8. Status post left above-knee amputation secondary to necrotizing fasciitis 03/06/2009.  9. Neurodermatitis.  10. Previous history of alcohol usage.  11. Admission to the hospital from 12/20/2011 through 12/25/2011 for hematochezia felt to be secondary to a diverticular bleed, requiring 2 units of packed red cells.  12. Diverticulosis.  13. Elevated alpha fetoprotein, 72.3 on 11/19/2011 and 76.0 on 01/11/2012.  14. Abnormal MRI of the liver from 11/28/2011 showing diffuse and markedly low signal intensity throughout the liver as well as diffuse low signal intensity in the adrenal glands. 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 There were no findings for cirrhosis, portal hypertension, splenomegaly or ascites, not were there any worrisome enhancing liver lesions.  15. Systolic ejection murmur.  Past Medical History, Surgical history, Social history, and Family History were reviewed and updated.  Review of Systems: Constitutional:  Negative for fever, chills, night sweats, anorexia, weight loss, pain. Cardiovascular: no chest pain or dyspnea on exertion Respiratory: no cough, shortness of breath, or  wheezing Neurological: no TIA or stroke symptoms Dermatological: positive for bruise when she hit her right arm accidentally. ENT: negative for - epistaxis Skin: Negative. Gastrointestinal: no abdominal pain, change in bowel habits, or black or bloody stools Genito-Urinary: no dysuria, trouble voiding, or hematuria Hematological and Lymphatic: negative for - bleeding problems Breast: negative for breast lumps Musculoskeletal: positive for - pain in knee - right Remaining ROS negative.  Physical Exam: Blood pressure 128/71, pulse 82, temperature 98.3 F (36.8 C), temperature source Oral, resp. rate 18, height 5\' 3"  (1.6 m), weight 184 lb 9.6 oz (83.734 kg). ECOG: 3 General appearance: alert, appears stated age, no distress and moderately obese Head: Normocephalic, without obvious abnormality, atraumatic Neck: no adenopathy, supple, symmetrical, trachea midline and thyroid not enlarged, symmetric, no tenderness/mass/nodules Lymph nodes: Cervical adenopathy: None appreciated Heart:regular rate and rhythm, S1, S2 normal and systolic murmur: systolic ejection 2/6, harsh and previously documented without radiation Lung:chest clear, no wheezing, rales, normal symmetric air entry, Heart exam - S1, S2 normal,  no murmur, no gallop, rate regular Abdomin: soft, distended, normal bowel sounds, nontender and incisional hernia and easily reducible and nontender EXT:No peripheral edema on right extremity; Left above the knee amputation.  She is wearing a prothesis.    Lab Results: Lab Results  Component Value Date   WBC 9.1 06/01/2013   HGB 7.2* 06/01/2013   HCT 24.1* 06/01/2013   MCV 77.5* 06/01/2013   PLT 396 06/01/2013     Chemistry      Component Value Date/Time   NA 136 06/01/2013 0907   NA 128* 04/09/2012 2333   K 3.7 06/01/2013 0907   K 3.8 04/09/2012 2333   CL 104 10/03/2012 1048   CL 92* 04/09/2012 2333   CO2 22 06/01/2013 0907   CO2 27 04/09/2012 2333   BUN 11.1 06/01/2013 0907   BUN 7  04/09/2012 2333   CREATININE 0.9 06/01/2013 0907   CREATININE 0.61 04/09/2012 2333      Component Value Date/Time   CALCIUM 8.5 06/01/2013 0907   CALCIUM 8.0* 04/10/2012 0410   ALKPHOS 101 06/01/2013 0907   ALKPHOS 57 04/09/2012 1615   AST 48* 06/01/2013 0907   AST 45* 04/09/2012 1615   ALT 42 06/01/2013 0907   ALT 40* 04/09/2012 1615   BILITOT 0.32 06/01/2013 0907   BILITOT 0.3 04/09/2012 1615     Iron/TIBC/Ferritin    Component Value Date/Time   IRON 16* 06/01/2013 0906   IRON 27* 10/03/2012 1048   TIBC 501* 06/01/2013 0906   TIBC 434 10/03/2012 1048   FERRITIN 10 06/01/2013 0906   FERRITIN 41 11/17/2012 1537     Radiological Studies: No results found.   IMPRESSION/PLAN:  1. Iron deficiency anemia secondary to GI lost. --Her hemogloblin is within normal limits.  Her ferritin is 10.  We   Facilitate iron transfusion today.  We will continue to support this patient as best we can with IV  iron.  We are continuing to check a CBC and ferritin every month. We will plan to see Ms. Poland again in 2 months, at which time we will check CBC, chemistries, and iron studies. She is asymptomatic presently.   2. Elevated transiminases, mild. --in the setting of an elevated ferritin likely due to secondary hemochromatosis and chronic hepatitis C.   We prefer to use annual liver MRI and initiate chelation if the liver iron is >3 mg/g dry weight or if the serum ferritin is >1000 ng/mL on two consecutive measurements.  Her last MRI of abdomen (11/2011) demonstrated findings consistent with secondary or transfusional hemochromatosis involving the liver, spleen, adrenal glands and bone marrow. No findings for cirrhosis, portal hypertension, splenomegaly or ascites.  No worrisome enhancing liver lesions.  We will perform an ultrasound of the liver in continue to  trend or LFTs increase or based on symptoms.   Patient was counseled to avoid hepatatoxins including percocet which has tylenol.    3. Hernia,  incisional. --Patient reports pre-surgery evaluation on 01/08.   Patient will need repeat CBC prior to surgery.  May require blood transfusion or delay until hemoglobin is stable (baseline is between 9 and 11).   I spent more than half the time coordinating care.    Yakov Bergen, MD 1/5/20159:24 PM

## 2013-06-02 ENCOUNTER — Ambulatory Visit (HOSPITAL_COMMUNITY)
Admission: RE | Admit: 2013-06-02 | Discharge: 2013-06-02 | Disposition: A | Discharge status: Home / Self Care | Payer: Medicare Other | Source: Ambulatory Visit | Attending: Anesthesiology | Admitting: Anesthesiology

## 2013-06-02 ENCOUNTER — Encounter (HOSPITAL_COMMUNITY): Payer: Self-pay

## 2013-06-02 ENCOUNTER — Encounter (HOSPITAL_COMMUNITY)
Admission: RE | Admit: 2013-06-02 | Discharge: 2013-06-02 | Disposition: A | Discharge status: Home / Self Care | Payer: Medicare Other | Source: Ambulatory Visit | Attending: Surgery | Admitting: Surgery

## 2013-06-02 ENCOUNTER — Telehealth: Payer: Self-pay

## 2013-06-02 ENCOUNTER — Encounter (INDEPENDENT_AMBULATORY_CARE_PROVIDER_SITE_OTHER): Payer: Self-pay | Admitting: General Surgery

## 2013-06-02 ENCOUNTER — Encounter (HOSPITAL_COMMUNITY): Payer: Self-pay | Admitting: Vascular Surgery

## 2013-06-02 ENCOUNTER — Telehealth (INDEPENDENT_AMBULATORY_CARE_PROVIDER_SITE_OTHER): Payer: Self-pay

## 2013-06-02 DIAGNOSIS — I739 Peripheral vascular disease, unspecified: Secondary | ICD-10-CM | POA: Diagnosis not present

## 2013-06-02 DIAGNOSIS — Z01818 Encounter for other preprocedural examination: Secondary | ICD-10-CM | POA: Insufficient documentation

## 2013-06-02 DIAGNOSIS — K272 Acute peptic ulcer, site unspecified, with both hemorrhage and perforation: Secondary | ICD-10-CM | POA: Diagnosis not present

## 2013-06-02 DIAGNOSIS — I10 Essential (primary) hypertension: Secondary | ICD-10-CM | POA: Insufficient documentation

## 2013-06-02 HISTORY — DX: Anxiety disorder, unspecified: F41.9

## 2013-06-02 NOTE — Progress Notes (Signed)
Patient's hemoglobin is 7.2.  Error in assessment that it was within normal limits.

## 2013-06-02 NOTE — Progress Notes (Signed)
Anesthesia Note:  Patient is a 63 year old female who was scheduled for incisional hernia repair on 06/04/13 by Dr. Coralie Keens.  However, labs done on 06/01/13 at [facility] showed a glucose of 304 and H/H on 7.2/24.1 (down from 12.0/35.8 on 01/30/13) so medical clearance was recommended preoperatively.  Case is now canceled for 06/04/13, and she has an appointment at [facility] on 06/05/13.  She also has on-going follow-up at the [facility] with Dr. Concha Norway for iron deficiency anemia. She has been followed by GI Dr. Olegario Messier at [facility] for AVM ablation. She has seen Dr. Carol Ada locally but it appears she was discharged from his practice due to inappropriate behavior.  History includes recurrent iron deficiency anemia secondary to GI bleed due to AV malformations and diverticulosis (also on IV Feraheme), left AKA due to necrotizing fascitis 02/2009, hepatitis C, GERD, HTN, smoking, obesity, right Bell's Palsy, partial colectomy for chronic lower GI bleed followed by exploratory laparotomy and over-sewing of colonic anastomosis for persistent post-operative rectal bleeding 01/2012.  EKG on 10/11/12 showed SR with first degree AVB.  Echo on 02/22/10 showed: - Left ventricle: The cavity size was normal. Wall thickness was increased in a pattern of moderate LVH. The estimated ejection fraction was 60%. Wall motion was normal; there were no regional wall motion abnormalities. - Left atrium: The atrium was mildly dilated. - Right ventricle: The cavity size was mildly dilated. Systolic function was mildly reduced. - Right atrium: The atrium was mildly dilated. - Tricuspid valve: Mild regurgitation. - Pulmonary arteries: PA peak pressure: 67mm Hg (S).  CXR on 06/02/13 showed: There is no evidence of pneumonia nor CHF. Mild hyperinflation may be voluntary or could reflect underlying COPD.  As above, she is to be evaluated by her PCP this week for presumed new-onset DM.  There is also communication  from [facility] to Dr. Boyce Medici office requesting hematology clearance.  I anticipate once she is cleared medically then she will be rescheduled for surgery.  George Hugh [facility] Short Stay Center/Anesthesiology Phone [phone] 06/02/2013 8:06 PM

## 2013-06-02 NOTE — Telephone Encounter (Signed)
Maryclare Bean called from [facility]. She is requesting clearance from hematology so pt can have an abdominal hernia repair by Dr Mercy Riding. Pt's Hgb was 7.2 on 06/01/13 and pt did receive feraheme injection. Deneise Lever said we can "in-basket" her or fax to [phone]. This was forwarded to Dr Juliann Mule.

## 2013-06-02 NOTE — Telephone Encounter (Signed)
Pre-op called to report that patient will need Medical Clearance prior to surgery due to blood glucose value of 304, patient's hemoglobin 7.2.  Per pre-op patient denied having diabetes.  Patient scheduled for Incisional Hernia Repair on 06/04/13.

## 2013-06-02 NOTE — Progress Notes (Signed)
Ebony Hail notified of pt.glucose of 304 CMET done yesterday at [facility].

## 2013-06-02 NOTE — Pre-Procedure Instructions (Addendum)
[name]  06/02/2013   Your procedure is scheduled on:  06-04-2013  Thursday  Report to [facility]  2 * 3 at 8:30 AM.   Call this number if you have problems the morning of surgery: [phone]   Remember:   Do not eat food or drink liquids after midnight.    Take these medicines the morning of surgery with A SIP OF WATER: Atenolol(Tenormin),certirizine(Zyrtec),nexium,prozac,gabapentin,pain medications as ordered   Do not wear jewelry, make-up or nail polish.  Do not wear lotions, powders, or perfumes. You may wear deodorant.  Do not shave 48 hours prior to surgery.   Do not bring valuables to the hospital.  [facility] is not responsible  for any belongings or valuables.               Contacts, dentures or bridgework may not be worn into surgery.   Leave suitcase in the car. After surgery it may be brought to your room.  For patients admitted to the hospital, discharge time is determined by your  treatment team.               Patients discharged the day of surgery will not be allowed to drive home.   Name and phone number of your driver:    Special Instructions: Shower using CHG 2 nights before surgery and the night before surgery.  If you shower the day of surgery use CHG.  Use special wash - you have one bottle of CHG for all showers.  You should use approximately 1/3 of the bottle for each shower.   Please read over the following fact sheets that you were given: Coughing and Deep Breathing and Surgical Site Infection Prevention

## 2013-06-04 ENCOUNTER — Encounter (HOSPITAL_COMMUNITY): Admission: RE | Payer: Self-pay | Source: Ambulatory Visit

## 2013-06-04 ENCOUNTER — Inpatient Hospital Stay (HOSPITAL_COMMUNITY): Admission: RE | Admit: 2013-06-04 | Payer: Medicare Other | Source: Ambulatory Visit | Admitting: Surgery

## 2013-06-04 SURGERY — REPAIR, HERNIA, INCISIONAL
Anesthesia: General

## 2013-06-05 ENCOUNTER — Encounter: Payer: Self-pay | Admitting: Internal Medicine

## 2013-06-05 ENCOUNTER — Ambulatory Visit (INDEPENDENT_AMBULATORY_CARE_PROVIDER_SITE_OTHER): Payer: Medicare Other | Admitting: Internal Medicine

## 2013-06-05 VITALS — BP 133/71 | HR 77 | Temp 97.9°F | Ht 63.0 in | Wt 188.0 lb

## 2013-06-05 DIAGNOSIS — IMO0002 Reserved for concepts with insufficient information to code with codable children: Secondary | ICD-10-CM

## 2013-06-05 DIAGNOSIS — R739 Hyperglycemia, unspecified: Secondary | ICD-10-CM

## 2013-06-05 DIAGNOSIS — G547 Phantom limb syndrome without pain: Secondary | ICD-10-CM | POA: Diagnosis not present

## 2013-06-05 DIAGNOSIS — G546 Phantom limb syndrome with pain: Secondary | ICD-10-CM

## 2013-06-05 DIAGNOSIS — E1165 Type 2 diabetes mellitus with hyperglycemia: Secondary | ICD-10-CM

## 2013-06-05 DIAGNOSIS — E118 Type 2 diabetes mellitus with unspecified complications: Secondary | ICD-10-CM

## 2013-06-05 DIAGNOSIS — E119 Type 2 diabetes mellitus without complications: Secondary | ICD-10-CM

## 2013-06-05 LAB — GLUCOSE, CAPILLARY: Glucose-Capillary: 235 mg/dL — ABNORMAL HIGH (ref 70–99)

## 2013-06-05 LAB — POCT GLYCOSYLATED HEMOGLOBIN (HGB A1C): HEMOGLOBIN A1C: 6.6

## 2013-06-05 MED ORDER — GABAPENTIN 400 MG PO CAPS: 800.0000 mg | ORAL_CAPSULE | Freq: Three times a day (TID) | ORAL | Status: DC

## 2013-06-05 NOTE — Assessment & Plan Note (Deleted)
This is a new diagnosis for the patient. With reference to paper listed below this is 85 and 87 % sensitive and specific respectively for diagnosis of DM in anemic patients coupled with pts symptoms of polyuria and polydipsia. Pt has also had 2 random CBGs above 200.  Lab Results  Component Value Date   HGBA1C 6.6 06/05/2013    Son,J et.al. Hemoglobin A1c May Be an Inadequate Diagnostic Tool for Diabetes Mellitus in Anemic Subjects. Diabetes Metab J. Oct 2013; 37(5): 343-348.PMCID: [id number]   Extensive education into new diagnosis was discussed with the patient with reference materials provided. Pt did not want to pursue medications yet at this time (no renal or hepatic impairment) making metformin even at low dose very favorable option. Risks and benefits of delaying treatment was had with the patient and she still would not like to take medications yet at this time.  -lifestyle modifications with re-eval in 1 month -DM educator referral

## 2013-06-05 NOTE — Patient Instructions (Signed)
In terms of the worry of diabetes  -making some diet and lifestyle changes can help and we will check in one month to see if it is working  Nice to see you and happy new year!

## 2013-06-06 NOTE — Progress Notes (Signed)
   Subjective:    Patient ID: [name], female    DOB: [birth year], 63 y.o.   MRN: [id number]  HPI [name] is a 63yo woman pmh as listed below presents for some hyperglycemia on pre-op lab work.   Pt was set to have elective abdominal hernia repair for incisional hernia when CBG was noted to be 300. There was concern for new diagnosis of DM and therefore pt was sent to [facility]. Pt is significantly distressed and in denial of possible DM diagnosis. Several if not all immediate family members have DM on insulin but pt herself never had a diagnosis. She has been experiencing some polyuria and polydipsia during the past month and unclear about blurry vision as she recently had bacterial conjunctivitis that is still undergoing treatment. She states that right before her lab tests w/in 30 min of her blood work she had pancakes with syrup and sweet tea.   Review of Systems  Constitutional: Positive for fatigue. Negative for fever, chills, activity change, appetite change and unexpected weight change.  Eyes: Positive for discharge. Negative for visual disturbance.  Respiratory: Negative for cough and shortness of breath.   Cardiovascular: Negative for chest pain.  Gastrointestinal: Negative for nausea, vomiting, abdominal pain, diarrhea, constipation and abdominal distention.  Endocrine: Positive for polydipsia and polyuria. Negative for cold intolerance and heat intolerance.  Genitourinary: Positive for frequency.  Neurological: Negative for dizziness and headaches.    Past Medical History  Diagnosis Date  . Hypertension   . Colon polyps   . Stomach problems   . Hypokalemic alkalosis   . Allergic rhinitis   . GERD (gastroesophageal reflux disease)   . Stroke 2010    no residual problems  . Anemia, iron deficiency 05/03/2011    recieves iron infusions  . Anemia   . GI bleeding   . Diverticulitis   . Transfusion history   . Difficulty sleeping     takes trazadone for sleep  .  Peripheral vascular disease   . Hepatitis     hep C  . Anxiety   . Shortness of breath     due to anemia   Social, surgical, family hx reviewed.     Objective:   Physical Exam Filed Vitals:   06/05/13 1043  BP: 133/71  Pulse: 77  Temp: 97.9 F (36.6 C)   General: sitting in wheelchair, upset but NAD HEENT: PERRL, EOMI, no scleral icterus Cardiac: RRR, no rubs, murmurs or gallops Pulm: clear to auscultation bilaterally, moving normal volumes of air Abd: soft, nontender, nondistended, BS present Ext: warm and well perfused, no pedal edema, left BKA Neuro: alert and oriented X3, cranial nerves II-XII grossly intact     Assessment & Plan:  Please see problem oriented charting.   Pt discussed with Dr. Marinda Elk

## 2013-06-07 DIAGNOSIS — E1140 Type 2 diabetes mellitus with diabetic neuropathy, unspecified: Secondary | ICD-10-CM | POA: Insufficient documentation

## 2013-06-07 DIAGNOSIS — E1122 Type 2 diabetes mellitus with diabetic chronic kidney disease: Secondary | ICD-10-CM | POA: Insufficient documentation

## 2013-06-07 DIAGNOSIS — E119 Type 2 diabetes mellitus without complications: Secondary | ICD-10-CM

## 2013-06-07 NOTE — Assessment & Plan Note (Signed)
This is a new diagnosis for the patient. With reference to paper listed below this is 85 and 87 % sensitive and specific respectively for diagnosis of DM in anemic patients coupled with pts symptoms of polyuria and polydipsia. Pt has also had 2 random CBGs above 200.  Lab Results  Component Value Date   HGBA1C 6.6 06/05/2013    Son,J et.al. Hemoglobin A1c May Be an Inadequate Diagnostic Tool for Diabetes Mellitus in Anemic Subjects. Diabetes Metab J. Oct 2013; 37(5): 343-348.PMCID: [id number]   Extensive education into new diagnosis was discussed with the patient with reference materials provided. Pt did not want to pursue medications yet at this time (no renal or hepatic impairment) making metformin even at low dose very favorable option. Risks and benefits of delaying treatment was had with the patient and she still would not like to take medications yet at this time.  -lifestyle modifications with re-eval in 1 month -DM educator referral 

## 2013-06-07 NOTE — Assessment & Plan Note (Signed)
>>  ASSESSMENT AND PLAN FOR TYPE 2 DIABETES MELLITUS WITH DIABETIC NEUROPATHY (HCC) WRITTEN ON 06/07/2013 12:15 AM BY MONTY BAIL, MD  This is a new diagnosis for the patient. With reference to paper listed below this is 85 and 87 % sensitive and specific respectively for diagnosis of DM in anemic patients coupled with pts symptoms of polyuria and polydipsia. Pt has also had 2 random CBGs above 200.  Lab Results  Component Value Date   HGBA1C 6.6 06/05/2013    Son,J et.al. Hemoglobin A1c May Be an Inadequate Diagnostic Tool for Diabetes Mellitus in Anemic Subjects. Diabetes Metab J. Oct 2013; 37(5): 343-348.PMCID: [id number]   Extensive education into new diagnosis was discussed with the patient with reference materials provided. Pt did not want to pursue medications yet at this time (no renal or hepatic impairment) making metformin  even at low dose very favorable option. Risks and benefits of delaying treatment was had with the patient and she still would not like to take medications yet at this time.  -lifestyle modifications with re-eval in 1 month -DM educator referral

## 2013-06-08 NOTE — Progress Notes (Signed)
Case discussed with Dr. Sadek soon after the resident saw the patient.  We reviewed the resident's history and exam and pertinent patient test results.  I agree with the assessment, diagnosis, and plan of care documented in the resident's note. 

## 2013-06-09 ENCOUNTER — Other Ambulatory Visit: Payer: Self-pay | Admitting: Internal Medicine

## 2013-06-24 ENCOUNTER — Ambulatory Visit (INDEPENDENT_AMBULATORY_CARE_PROVIDER_SITE_OTHER): Payer: Medicare Other | Admitting: Internal Medicine

## 2013-06-24 ENCOUNTER — Encounter: Payer: Self-pay | Admitting: Internal Medicine

## 2013-06-24 VITALS — BP 134/74 | HR 77 | Temp 97.0°F | Wt 184.4 lb

## 2013-06-24 DIAGNOSIS — K432 Incisional hernia without obstruction or gangrene: Secondary | ICD-10-CM

## 2013-06-24 MED ORDER — OXYCODONE-ACETAMINOPHEN 10-325 MG PO TABS: 1.0000 | ORAL_TABLET | Freq: Four times a day (QID) | ORAL | Status: DC | PRN

## 2013-06-24 NOTE — Progress Notes (Signed)
Patient ID: [name], female   DOB: 29-Dec-1950, 63 y.o.   MRN: [id number]  Subjective:   Patient ID: [name] female   DOB: 06/03/1950 63 y.o.   MRN: [id number]  HPI: [name] is a 63 y.o. F w/ PMH HTN, GERS, and an abdominal hernia, presents today c/o pain from her hernia site.  She was seen by Dr. Algis Liming on 1/9 for medical clearance due to concerns for hyperglycemia on her pre-op labwork for hernia repair. Her blood glucose was 304. She had previous CBGs >200 but had never been diagnosed with DM. Her A1c during that visit was 6.6. Medications were deferred at that time per pt request, but she was to implement lifestyle changes.    She presents today c/o worsening abdominal pain at her hernia site after drinking a glass of gin and some moonshine for her sister's birthday earlier this week. She states that she was having significant pain after drinking the EtOH, but this has resolved this morning. She denies any changes in her bowel habits or blood in her stool.    She is to have lab work on 2/5 at [facility] and states that if things look improved they will schedule her incisional hernial repair surgery.    Past Medical History  Diagnosis Date  . Hypertension   . Colon polyps   . Stomach problems   . Hypokalemic alkalosis   . Allergic rhinitis   . GERD (gastroesophageal reflux disease)   . Stroke 2010    no residual problems  . Anemia, iron deficiency 05/03/2011    recieves iron infusions  . Anemia   . GI bleeding   . Diverticulitis   . Transfusion history   . Difficulty sleeping     takes trazadone for sleep  . Peripheral vascular disease   . Hepatitis     hep C  . Anxiety   . Shortness of breath     due to anemia   Current Outpatient Prescriptions  Medication Sig Dispense Refill  . aspirin EC 81 MG tablet Take 2 tablets (162 mg total) by mouth 2 (two) times daily.  31 tablet  1  . atenolol (TENORMIN) 50 MG tablet Take 50 mg by mouth daily.       . cetirizine (ZYRTEC) 10 MG tablet Take 10 mg by mouth daily.      Marland Kitchen esomeprazole (NEXIUM) 40 MG capsule Take 1 capsule (40 mg total) by mouth 2 (two) times daily.  30 capsule  12  . FLUoxetine (PROZAC) 20 MG capsule Take 1 capsule (20 mg total) by mouth every morning.  30 capsule  3  . gabapentin (NEURONTIN) 400 MG capsule Take 2 capsules (800 mg total) by mouth 3 (three) times daily.  180 capsule  3  . hydrochlorothiazide (HYDRODIURIL) 25 MG tablet TAKE 1 TABLET BY MOUTH DAILY  30 tablet  12  . lisinopril (PRINIVIL,ZESTRIL) 40 MG tablet Take 40 mg by mouth daily.      . potassium chloride 20 MEQ/15ML (10%) solution Take 20 mEq by mouth daily.      Marland Kitchen Specialty Vitamins Products (MAGNESIUM, AMINO ACID CHELATE,) 133 MG tablet Take 1 tablet by mouth 2 (two) times daily.      . traMADol (ULTRAM) 50 MG tablet Take 2 tablets (100 mg total) by mouth every 12 (twelve) hours as needed. For pain.  65 tablet  1  . traZODone (DESYREL) 100 MG tablet Take 100 mg by mouth at bedtime.      Marland Kitchen  vitamin C (ASCORBIC ACID) 500 MG tablet Take 1 tablet (500 mg total) by mouth daily.  30 tablet  12   No current facility-administered medications for this visit.   Family History  Problem Relation Age of Onset  . Cancer Father     unknown  . Hypertension Mother   . Diverticulitis Mother    History   Social History  . Marital Status: Widowed    Spouse Name: N/A    Number of Children: N/A  . Years of Education: N/A   Social History Main Topics  . Smoking status: Current Every Day Smoker -- 0.20 packs/day for 17 years    Types: Cigarettes  . Smokeless tobacco: Never Used     Comment: wants to quit  . Alcohol Use: Yes     Comment: Occasionally  . Drug Use: Yes    Special: Marijuana, "Crack" cocaine     Comment: Thanksgiving and Christmas  Holidays 2014 hasn't had any since  . Sexual Activity: None   Other Topics Concern  . None   Social History Narrative  . None   Review of  Systems: Constitutional: Denies fever, appetite change, and fatigue.  Respiratory: Denies SOB, DOE Cardiovascular: Denies chest pain  Gastrointestinal: +abdominal pain at her hernia site. Denies nausea, vomiting, diarrhea, constipation, blood in stool and abdominal distention.  Genitourinary: Denies dysuria, urgency, frequency Neurological: Denies dizziness, weakness, light-headedness  Psychiatric/Behavioral: Denies mood changes A 12 point ROS was performed; pertinent positives and negatives are noted above  Objective:  Physical Exam: Filed Vitals:   06/24/13 0845  BP: 134/74  Pulse: 77  Temp: 97 F (36.1 C)  TempSrc: Oral  Weight: 184 lb 6.4 oz (83.643 kg)  SpO2: 97%   Constitutional: Vital signs reviewed.  Patient is a well-developed and well-nourished female in no acute distress and cooperative with exam.  Head: Normocephalic and atraumatic Eyes: PERRL, EOMI  Cardiovascular: RRR, no MRG, pulses symmetric and intact bilaterally Pulmonary/Chest: normal respiratory effort, CTAB, no wheezes, rales, or rhonchi Abdominal: Soft. Non-tender, non-distended, bowel sounds are present but hypoactive, no guarding present. Incisional hernia present at large well healed incision site. Hypopigmentation at the old incision site. Musculoskeletal: LLL prosthesis. Moves all extremities Neurological: A&O x3, no focal neurological deficit. Cranial nerve II-XII are grossly intact Psychiatric: Normal mood and affect. speech and behavior is normal.    Assessment & Plan:   Please refer to Problem List based Assessment and Plan

## 2013-06-24 NOTE — Patient Instructions (Signed)
Avoid the alcohol! If your pain returns, please call our clinic or call Dr. Ninfa Linden.

## 2013-06-24 NOTE — Assessment & Plan Note (Signed)
Pt's pain has resolved, likely for her EtOH consumption at her sister's party. On exam, incisional hernia present but not incarcerated. Pt states that she is having normal bowel movements and denies blood in her stool. She is to have labwork on 2/5 and her surgeon, Dr. Ninfa Linden will determine if she is a good candidate for surgery pending the results. I have advised her to avoid the EtOH in the future. She is to call the clinic or Dr. Ninfa Linden if her pain returns. She was given a refill prescription for Percocet 10-325mg  1 tab q6h PRN pain, not to be filled until 06/29/13.

## 2013-06-25 ENCOUNTER — Other Ambulatory Visit: Payer: Self-pay | Admitting: Internal Medicine

## 2013-06-25 DIAGNOSIS — Z1231 Encounter for screening mammogram for malignant neoplasm of breast: Secondary | ICD-10-CM

## 2013-06-25 NOTE — Progress Notes (Signed)
Case discussed with Dr. Glenn soon after the resident saw the patient.  We reviewed the resident's history and exam and pertinent patient test results.  I agree with the assessment, diagnosis, and plan of care documented in the resident's note. 

## 2013-07-01 ENCOUNTER — Telehealth: Payer: Self-pay | Admitting: Internal Medicine

## 2013-07-01 NOTE — Telephone Encounter (Signed)
Called Dr. Zenia Resides office.  Patient kept appointment on 05/27/2013.  No current appointments.  Records are being faxed.

## 2013-07-01 NOTE — Telephone Encounter (Signed)
Called Dr. Zenia Resides office.  Patient last office visit 05/27/2013 being faxed.  No appointment have been sch for 2015.

## 2013-07-02 ENCOUNTER — Other Ambulatory Visit: Payer: Self-pay

## 2013-07-02 ENCOUNTER — Telehealth: Payer: Self-pay | Admitting: Internal Medicine

## 2013-07-02 ENCOUNTER — Ambulatory Visit (HOSPITAL_COMMUNITY): Payer: Medicare Other | Attending: Internal Medicine

## 2013-07-02 ENCOUNTER — Other Ambulatory Visit: Payer: Medicare Other

## 2013-07-02 NOTE — Telephone Encounter (Signed)
S/w the pt and she is aware of her lab appt on 07/06/2013.

## 2013-07-06 ENCOUNTER — Other Ambulatory Visit (HOSPITAL_BASED_OUTPATIENT_CLINIC_OR_DEPARTMENT_OTHER): Payer: Medicare Other

## 2013-07-06 DIAGNOSIS — D500 Iron deficiency anemia secondary to blood loss (chronic): Secondary | ICD-10-CM

## 2013-07-06 LAB — COMPREHENSIVE METABOLIC PANEL (CC13)
ALBUMIN: 3.4 g/dL — AB (ref 3.5–5.0)
ALT: 67 U/L — AB (ref 0–55)
ANION GAP: 10 meq/L (ref 3–11)
AST: 121 U/L — ABNORMAL HIGH (ref 5–34)
Alkaline Phosphatase: 120 U/L (ref 40–150)
BUN: 8.8 mg/dL (ref 7.0–26.0)
CO2: 28 meq/L (ref 22–29)
Calcium: 8.9 mg/dL (ref 8.4–10.4)
Chloride: 103 mEq/L (ref 98–109)
Creatinine: 0.8 mg/dL (ref 0.6–1.1)
Glucose: 155 mg/dl — ABNORMAL HIGH (ref 70–140)
Potassium: 3.5 mEq/L (ref 3.5–5.1)
SODIUM: 141 meq/L (ref 136–145)
TOTAL PROTEIN: 6.6 g/dL (ref 6.4–8.3)
Total Bilirubin: 0.33 mg/dL (ref 0.20–1.20)

## 2013-07-06 LAB — IRON AND TIBC CHCC
%SAT: 6 % — AB (ref 21–57)
Iron: 26 ug/dL — ABNORMAL LOW (ref 41–142)
TIBC: 435 ug/dL (ref 236–444)
UIBC: 409 ug/dL — ABNORMAL HIGH (ref 120–384)

## 2013-07-06 LAB — CBC WITH DIFFERENTIAL/PLATELET
BASO%: 0.4 % (ref 0.0–2.0)
BASOS ABS: 0 10*3/uL (ref 0.0–0.1)
EOS%: 1.8 % (ref 0.0–7.0)
Eosinophils Absolute: 0.1 10*3/uL (ref 0.0–0.5)
HCT: 32.5 % — ABNORMAL LOW (ref 34.8–46.6)
HEMOGLOBIN: 10.4 g/dL — AB (ref 11.6–15.9)
LYMPH%: 38.3 % (ref 14.0–49.7)
MCH: 27.4 pg (ref 25.1–34.0)
MCHC: 32 g/dL (ref 31.5–36.0)
MCV: 85.5 fL (ref 79.5–101.0)
MONO#: 0.6 10*3/uL (ref 0.1–0.9)
MONO%: 13.1 % (ref 0.0–14.0)
NEUT#: 2.3 10*3/uL (ref 1.5–6.5)
NEUT%: 46.4 % (ref 38.4–76.8)
Platelets: 196 10*3/uL (ref 145–400)
RBC: 3.8 10*6/uL (ref 3.70–5.45)
RDW: 21.1 % — ABNORMAL HIGH (ref 11.2–14.5)
WBC: 4.9 10*3/uL (ref 3.9–10.3)
lymph#: 1.9 10*3/uL (ref 0.9–3.3)

## 2013-07-06 LAB — FERRITIN CHCC: Ferritin: 63 ng/ml (ref 9–269)

## 2013-07-10 ENCOUNTER — Ambulatory Visit (HOSPITAL_COMMUNITY): Payer: Medicare Other | Attending: Internal Medicine

## 2013-07-10 ENCOUNTER — Ambulatory Visit (INDEPENDENT_AMBULATORY_CARE_PROVIDER_SITE_OTHER): Payer: Medicare Other | Admitting: Internal Medicine

## 2013-07-10 ENCOUNTER — Ambulatory Visit (INDEPENDENT_AMBULATORY_CARE_PROVIDER_SITE_OTHER): Payer: Medicare Other | Admitting: Dietician

## 2013-07-10 ENCOUNTER — Encounter: Payer: Self-pay | Admitting: Internal Medicine

## 2013-07-10 VITALS — BP 163/80 | HR 71 | Temp 97.5°F | Wt 184.4 lb

## 2013-07-10 DIAGNOSIS — E1165 Type 2 diabetes mellitus with hyperglycemia: Secondary | ICD-10-CM | POA: Diagnosis not present

## 2013-07-10 DIAGNOSIS — F32A Depression, unspecified: Secondary | ICD-10-CM | POA: Insufficient documentation

## 2013-07-10 DIAGNOSIS — IMO0002 Reserved for concepts with insufficient information to code with codable children: Secondary | ICD-10-CM

## 2013-07-10 DIAGNOSIS — F3289 Other specified depressive episodes: Secondary | ICD-10-CM | POA: Diagnosis not present

## 2013-07-10 DIAGNOSIS — E118 Type 2 diabetes mellitus with unspecified complications: Secondary | ICD-10-CM

## 2013-07-10 DIAGNOSIS — R7402 Elevation of levels of lactic acid dehydrogenase (LDH): Secondary | ICD-10-CM | POA: Diagnosis not present

## 2013-07-10 DIAGNOSIS — R7401 Elevation of levels of liver transaminase levels: Secondary | ICD-10-CM | POA: Diagnosis not present

## 2013-07-10 DIAGNOSIS — F329 Major depressive disorder, single episode, unspecified: Secondary | ICD-10-CM

## 2013-07-10 DIAGNOSIS — K432 Incisional hernia without obstruction or gangrene: Secondary | ICD-10-CM | POA: Diagnosis not present

## 2013-07-10 DIAGNOSIS — D500 Iron deficiency anemia secondary to blood loss (chronic): Secondary | ICD-10-CM | POA: Diagnosis not present

## 2013-07-10 LAB — GLUCOSE, CAPILLARY: Glucose-Capillary: 144 mg/dL — ABNORMAL HIGH (ref 70–99)

## 2013-07-10 MED ORDER — FLUOXETINE HCL 20 MG PO TABS: 40.0000 mg | ORAL_TABLET | Freq: Every day | ORAL | Status: DC

## 2013-07-10 MED ORDER — ACCU-CHEK NANO SMARTVIEW W/DEVICE KIT: 1.0000 | PACK | Freq: Once | Status: DC

## 2013-07-10 MED ORDER — GLUCOSE BLOOD VI STRP: ORAL_STRIP | Status: DC

## 2013-07-10 MED ORDER — ACCU-CHEK FASTCLIX LANCETS MISC: 1.0000 | Freq: Once | Status: DC

## 2013-07-10 NOTE — Progress Notes (Signed)
   Subjective:    Patient ID: [name], female    DOB: Jul 23, 1950, 63 y.o.   MRN: [id number]  HPI  [name] is a 64yo woman pmh as listed below presents for new diagnosis of Diabetes follow up.   During evaluation for elective abdominal hernia repair for incisional hernia her CBG was noted to be 300. Pt continues to be significantly distressed and in denial of DM diagnosis. The pt denied wanting to start medication when diagnosis was confirmed with HgbA1c 6.6 and was to implement lifestyle changes. Pt is very angry and agitated during the interview and in denial of her diagnosis.   Pt describes feelings of spontaneous tearful crying, decreased sleep, decreased concentration, and decreased sleep. She refuses to see a therapist "i am not a crazy person and i don't need any of those people."    Review of Systems  Constitutional: Positive for activity change, appetite change (decrease) and fatigue.  Psychiatric/Behavioral: Positive for sleep disturbance, decreased concentration and agitation. Negative for suicidal ideas and self-injury. The patient is nervous/anxious.        Passive thoughts of suicide but no active restriction of food, still taking medication, no self injury    Pt denied to fully participate in complete ROS and was aggressive and tearful during interview stating "i just want to be left alone to be in peace."  Past Medical History  Diagnosis Date  . Hypertension   . Colon polyps   . Stomach problems   . Hypokalemic alkalosis   . Allergic rhinitis   . GERD (gastroesophageal reflux disease)   . Stroke 2010    no residual problems  . Anemia, iron deficiency 05/03/2011    recieves iron infusions  . Anemia   . GI bleeding   . Diverticulitis   . Transfusion history   . Difficulty sleeping     takes trazadone for sleep  . Peripheral vascular disease   . Hepatitis     hep C  . Anxiety   . Shortness of breath     due to anemia   Social, surgical, family hx  reviewed.     Objective:   Physical Exam  Filed Vitals:   07/10/13 1356  BP: 163/80  Pulse: 71  Temp: 97.5 F (36.4 C)   General: sitting in wheelchair, upset but NAD HEENT: PERRL, EOMI, no scleral icterus Cardiac: RRR, no rubs, murmurs or gallops Pulm: clear to auscultation bilaterally, moving normal volumes of air Abd: soft, nontender, nondistended, BS present Ext: warm and well perfused, no pedal edema, left BKA Neuro: alert and oriented X3, cranial nerves II-XII grossly intact     Assessment & Plan:  Please see problem oriented charting.   Pt discussed with Dr. Stann Mainland

## 2013-07-10 NOTE — Patient Instructions (Signed)
Depression, Adult °Depression is feeling sad, low, down in the dumps, blue, gloomy, or empty. In general, there are two kinds of depression: °· Normal sadness or grief. This can happen after something upsetting. It often goes away on its own within 2 weeks. After losing a loved one (bereavement), normal sadness and grief may last longer than two weeks. It usually gets better with time. °· Clinical depression. This kind lasts longer than normal sadness or grief. It keeps you from doing the things you normally do in life. It is often hard to function at home, work, or at school. It may affect your relationships with others. Treatment is often needed. °GET HELP RIGHT AWAY IF: °· You have thoughts about hurting yourself or others. °· You lose touch with reality (psychotic symptoms). You may: °· See or hear things that are not real. °· Have untrue beliefs about your life or people around you. °· Your medicine is giving you problems. °MAKE SURE YOU: °· Understand these instructions. °· Will watch your condition. °· Will get help right away if you are not doing well or get worse. °Document Released: 06/16/2010 Document Revised: 02/06/2012 Document Reviewed: 09/13/2011 °[employer]® Patient Information ©2014 [employer]. ° °

## 2013-07-10 NOTE — Patient Instructions (Signed)
Please try to eat no more than about 3 carb/starch choices at each meal and 1 for snacks    Examples:  2 starch + 1 fruit = 3 carb choices           1 starch + 1 milk + 1 fruit = 3 carb choices           2 starch + 1 milk = 3 carb choices           3 starches = 3 carb choices  Veggies, healthy fats and meats do not count- add them as needed.   You said you are going o try to eat  Smaller portions as above for the next 3-4weeks.   Please make a follow up appointment for 3-4 weeks

## 2013-07-10 NOTE — Progress Notes (Signed)
Medical Nutrition Therapy:  Appt start time: 1500 end time:  1600.  Assessment:  Primary concerns today: Blood sugar control and Meal planning.  Patient with new diagnosis of diabetes. She is concerned and scared about possibly needing to use insulin and was an engaged learner today.. She verbalizes motivation to eat heathier and control her blood sugar with diet and exercise. Patient's personal aide present and very supportive.  Usual eating pattern includes 2 meals and 1-2 snacks per day. Note A1C 6.6% Frequent foods include koolaid, fried foods.   24-hr recall: Breakfast -  Cornflakes, 2% milk, juice and fruit   Lunch- often skips  Snk ( PM)- fruit Dinner - fried meats, beans or pasta, vegetables  Activity may be klimited by amputation  Progress Towards Goal(s):  In progress.   Nutritional Diagnosis:  NB-1.1 Food and nutrition-related knowledge deficit As related to lack of prior exposure to meal planning for diabetes.  As evidenced by questions and new diabetes diagnosis.    Intervention:  Nutrition education about meal ,planning for diabetes using carb consistent plate method of meal planning. Educated about A1C as means of monitoring her diabetes.   Monitoring/Evaluation:  Dietary intake, exercise, and body weight in 4 week(s).

## 2013-07-10 NOTE — Assessment & Plan Note (Signed)
>>  ASSESSMENT AND PLAN FOR TYPE 2 DIABETES MELLITUS WITH DIABETIC NEUROPATHY ([facility]) WRITTEN ON 07/10/2013  3:15 PM BY MONTY BAIL, MD  Pt was rude and disrespectful to DM educator and refused to discuss treatment options with that and this provider at this appt. Pt is still in denial of her clear diagnosis of DM and not wanting to participate in any medications at this time. The risks and benefits were clearly discussed with the patient. Not being able to talk with the patient maybe complicated by patients increased depression and apathy towards self treatment.  -supplemental lifestyle modification materials were provided -CBG meter was ordered and pt instructed to bring in readings at next appt

## 2013-07-10 NOTE — Assessment & Plan Note (Signed)
Pt was rude and disrespectful to DM educator and refused to discuss treatment options with that and this provider at this appt. Pt is still in denial of her clear diagnosis of DM and not wanting to participate in any medications at this time. The risks and benefits were clearly discussed with the patient. Not being able to talk with the patient maybe complicated by patients increased depression and apathy towards self treatment.  -supplemental lifestyle modification materials were provided -CBG meter was ordered and pt instructed to bring in readings at next appt

## 2013-07-10 NOTE — Assessment & Plan Note (Signed)
Pt was currently on 20mg  and still having acute exacerbation in light of her recent diagnosis of DM. She is still functioning but minimally at this time. Denied any homicidal or suicidal ideation.  -recommended CBT but pt refused and became very offended and aggressive -increase prosac to 40mg   -re-evaluate in 6-8 wks

## 2013-07-20 ENCOUNTER — Other Ambulatory Visit: Payer: Self-pay | Admitting: Internal Medicine

## 2013-07-21 ENCOUNTER — Telehealth: Payer: Self-pay | Admitting: Internal Medicine

## 2013-07-21 NOTE — Telephone Encounter (Signed)
sw. pt and advised on 2.26.15 fere...pt ok and aware

## 2013-07-23 ENCOUNTER — Other Ambulatory Visit: Payer: Self-pay | Admitting: *Deleted

## 2013-07-23 ENCOUNTER — Telehealth: Payer: Self-pay | Admitting: Internal Medicine

## 2013-07-23 ENCOUNTER — Ambulatory Visit: Payer: Medicare Other

## 2013-07-23 NOTE — Telephone Encounter (Signed)
pt called to r/s appt per pt request...done

## 2013-07-24 ENCOUNTER — Encounter: Payer: Self-pay | Admitting: Internal Medicine

## 2013-07-24 ENCOUNTER — Ambulatory Visit (INDEPENDENT_AMBULATORY_CARE_PROVIDER_SITE_OTHER): Payer: Medicare Other | Admitting: Internal Medicine

## 2013-07-24 VITALS — BP 149/88 | HR 89 | Temp 98.4°F | Ht 63.0 in | Wt 181.2 lb

## 2013-07-24 DIAGNOSIS — F32A Depression, unspecified: Secondary | ICD-10-CM

## 2013-07-24 DIAGNOSIS — E1165 Type 2 diabetes mellitus with hyperglycemia: Secondary | ICD-10-CM | POA: Diagnosis not present

## 2013-07-24 DIAGNOSIS — T8189XA Other complications of procedures, not elsewhere classified, initial encounter: Secondary | ICD-10-CM | POA: Diagnosis not present

## 2013-07-24 DIAGNOSIS — IMO0002 Reserved for concepts with insufficient information to code with codable children: Secondary | ICD-10-CM | POA: Diagnosis not present

## 2013-07-24 DIAGNOSIS — K432 Incisional hernia without obstruction or gangrene: Secondary | ICD-10-CM | POA: Diagnosis not present

## 2013-07-24 DIAGNOSIS — D500 Iron deficiency anemia secondary to blood loss (chronic): Secondary | ICD-10-CM | POA: Diagnosis not present

## 2013-07-24 DIAGNOSIS — F3289 Other specified depressive episodes: Secondary | ICD-10-CM | POA: Diagnosis not present

## 2013-07-24 DIAGNOSIS — E118 Type 2 diabetes mellitus with unspecified complications: Secondary | ICD-10-CM

## 2013-07-24 DIAGNOSIS — S78119A Complete traumatic amputation at level between unspecified hip and knee, initial encounter: Secondary | ICD-10-CM

## 2013-07-24 DIAGNOSIS — F329 Major depressive disorder, single episode, unspecified: Secondary | ICD-10-CM | POA: Diagnosis not present

## 2013-07-24 DIAGNOSIS — R7401 Elevation of levels of liver transaminase levels: Secondary | ICD-10-CM | POA: Diagnosis not present

## 2013-07-24 DIAGNOSIS — R7402 Elevation of levels of lactic acid dehydrogenase (LDH): Secondary | ICD-10-CM | POA: Diagnosis not present

## 2013-07-24 DIAGNOSIS — Z89619 Acquired absence of unspecified leg above knee: Secondary | ICD-10-CM

## 2013-07-24 LAB — GLUCOSE, CAPILLARY: Glucose-Capillary: 124 mg/dL — ABNORMAL HIGH (ref 70–99)

## 2013-07-24 MED ORDER — OXYCODONE-ACETAMINOPHEN 10-325 MG PO TABS: 1.0000 | ORAL_TABLET | Freq: Four times a day (QID) | ORAL | Status: DC | PRN

## 2013-07-24 MED ORDER — BACITRACIN ZINC 500 UNIT/GM EX OINT: TOPICAL_OINTMENT | CUTANEOUS | Status: DC

## 2013-07-24 NOTE — Progress Notes (Signed)
   Subjective:    Patient ID: [name], female    DOB: Mar 22, 1951, 63 y.o.   MRN: [id number]  HPI [name] is a 63yo woman pmh as listed below presents for follow up on SSRI increase.   Today the Pt shares that she is going through some emotional turmoil given the recent custody of her grandson to the state and that he is being put up for permanent adoption from his foster home. The patient has been acutely distressed by this news and having some problems coping with this new stress. She has started taking her increased SSRI medication and feels that maybe providing some relief. She is more motivated and starting to take care of herself.   the patient has been doing some compensatory eating and resistant to DM medications because of her recent stress. She would like to continue seeing the DM educator for lifestyle changes and appears more accepting of her disease. She is making changes in her diet and trying to exercise more often and has lost some weight! She is very apologetic about her reaction and manner towards providers at her last encounter.   She is also having some breakdown of her abdominal wound that she treats with dial soap and Vaseline everynight. She has not had changes in bowel habit or fever/chills.   Review of Systems  Constitutional: Positive for fatigue. Negative for fever, chills and diaphoresis.  Respiratory: Negative for cough, chest tightness and shortness of breath.   Cardiovascular: Negative for chest pain, palpitations and leg swelling.  Gastrointestinal: Negative for nausea, vomiting, abdominal pain, diarrhea, constipation, blood in stool and abdominal distention.  Genitourinary: Negative for difficulty urinating and pelvic pain.  Skin: Positive for wound (non-healing wound with slight breakdown).    Past Medical History  Diagnosis Date  . Hypertension   . Colon polyps   . Stomach problems   . Hypokalemic alkalosis   . Allergic rhinitis   . GERD  (gastroesophageal reflux disease)   . Stroke 2010    no residual problems  . Anemia, iron deficiency 05/03/2011    recieves iron infusions  . Anemia   . GI bleeding   . Diverticulitis   . Transfusion history   . Difficulty sleeping     takes trazadone for sleep  . Peripheral vascular disease   . Hepatitis     hep C  . Anxiety   . Shortness of breath     due to anemia   Social, surgical, family history reviewed with patient and updated in appropriate chart locations.     Objective:   Physical Exam Filed Vitals:   07/24/13 1537  BP: 149/88  Pulse: 89  Temp: 98.4 F (36.9 C)   General: sitting in wheelchair, slightly tearful  HEENT: PERRL, EOMI, no scleral icterus Cardiac: RRR, no rubs, murmurs or gallops Pulm: clear to auscultation bilaterally, moving normal volumes of air Abd: soft, nontender, nondistended, BS present, large reducible hernia, large midline incision with fresh granulation tissue and an area of 23mm diameter circle superficial sore located at waist line with some exudate but no frank pus or erythema, no fluctuance or induration Ext: warm and well perfused, no pedal edema Neuro: alert and oriented X3, cranial nerves II-XII grossly intact    Assessment & Plan:  Please see problem oriented charting  Pt discussed with Dr. Ellwood Dense

## 2013-07-25 DIAGNOSIS — T8189XA Other complications of procedures, not elsewhere classified, initial encounter: Secondary | ICD-10-CM | POA: Insufficient documentation

## 2013-07-25 NOTE — Assessment & Plan Note (Signed)
This has been a long standing issue and evaluated by her primary surgeon and pt was set to have elective repair of this wound and her hernia but DM was discovered. Area doesn't look infected and pt was given some bacitracin cream to use and avoid trauma to the area.  -re-eval in 1 month -bacitracin cream

## 2013-07-25 NOTE — Assessment & Plan Note (Signed)
Pt mood has improved since SSRI increase and is performing ADLs and making goals for herself. Will continue to monitor and offer counseling resources if pt becomes amenable.

## 2013-07-28 ENCOUNTER — Ambulatory Visit (HOSPITAL_BASED_OUTPATIENT_CLINIC_OR_DEPARTMENT_OTHER): Payer: Medicare Other

## 2013-07-28 VITALS — BP 172/77 | HR 78 | Temp 97.9°F | Resp 20

## 2013-07-28 DIAGNOSIS — D500 Iron deficiency anemia secondary to blood loss (chronic): Secondary | ICD-10-CM

## 2013-07-28 DIAGNOSIS — K922 Gastrointestinal hemorrhage, unspecified: Secondary | ICD-10-CM | POA: Diagnosis not present

## 2013-07-28 MED ORDER — DIPHENHYDRAMINE HCL 25 MG PO CAPS
ORAL_CAPSULE | ORAL | Status: AC
  Filled 2013-07-28: qty 1

## 2013-07-28 MED ORDER — DIPHENHYDRAMINE HCL 25 MG PO CAPS
25.0000 mg | ORAL_CAPSULE | Freq: Once | ORAL | Status: AC
  Administered 2013-07-28: 25 mg via ORAL

## 2013-07-28 MED ORDER — FERUMOXYTOL INJECTION 510 MG/17 ML
510.0000 mg | Freq: Once | INTRAVENOUS | Status: AC
  Administered 2013-07-28: 510 mg via INTRAVENOUS
  Filled 2013-07-28: qty 17

## 2013-07-28 NOTE — Patient Instructions (Signed)

## 2013-07-28 NOTE — Progress Notes (Signed)
Case discussed with Dr. Sadek soon after the resident saw the patient.  We reviewed the resident's history and exam and pertinent patient test results.  I agree with the assessment, diagnosis, and plan of care documented in the resident's note. 

## 2013-07-29 NOTE — Progress Notes (Signed)
Case discussed with Dr. Sadek at the time of the visit.  We reviewed the resident's history and exam and pertinent patient test results.  I agree with the assessment, diagnosis, and plan of care documented in the resident's note. 

## 2013-07-30 ENCOUNTER — Other Ambulatory Visit (HOSPITAL_BASED_OUTPATIENT_CLINIC_OR_DEPARTMENT_OTHER): Payer: Medicare Other

## 2013-07-30 ENCOUNTER — Ambulatory Visit (HOSPITAL_BASED_OUTPATIENT_CLINIC_OR_DEPARTMENT_OTHER): Payer: Medicare Other | Admitting: Internal Medicine

## 2013-07-30 ENCOUNTER — Ambulatory Visit (HOSPITAL_COMMUNITY)
Admission: RE | Admit: 2013-07-30 | Discharge: 2013-07-30 | Disposition: A | Discharge status: Home / Self Care | Payer: Medicare Other | Source: Ambulatory Visit | Attending: Internal Medicine | Admitting: Internal Medicine

## 2013-07-30 VITALS — BP 122/75 | HR 67 | Temp 98.4°F | Resp 19 | Ht 63.0 in

## 2013-07-30 DIAGNOSIS — R7401 Elevation of levels of liver transaminase levels: Secondary | ICD-10-CM

## 2013-07-30 DIAGNOSIS — K432 Incisional hernia without obstruction or gangrene: Secondary | ICD-10-CM

## 2013-07-30 DIAGNOSIS — R740 Nonspecific elevation of levels of transaminase and lactic acid dehydrogenase [LDH]: Secondary | ICD-10-CM

## 2013-07-30 DIAGNOSIS — D500 Iron deficiency anemia secondary to blood loss (chronic): Secondary | ICD-10-CM | POA: Diagnosis not present

## 2013-07-30 DIAGNOSIS — R7402 Elevation of levels of lactic acid dehydrogenase (LDH): Secondary | ICD-10-CM | POA: Diagnosis not present

## 2013-07-30 DIAGNOSIS — B182 Chronic viral hepatitis C: Secondary | ICD-10-CM

## 2013-07-30 DIAGNOSIS — K922 Gastrointestinal hemorrhage, unspecified: Secondary | ICD-10-CM

## 2013-07-30 DIAGNOSIS — Z1231 Encounter for screening mammogram for malignant neoplasm of breast: Secondary | ICD-10-CM | POA: Diagnosis not present

## 2013-07-30 LAB — CBC WITH DIFFERENTIAL/PLATELET
BASO%: 0.5 % (ref 0.0–2.0)
BASOS ABS: 0 10*3/uL (ref 0.0–0.1)
EOS ABS: 0.1 10*3/uL (ref 0.0–0.5)
EOS%: 1.3 % (ref 0.0–7.0)
HCT: 29.7 % — ABNORMAL LOW (ref 34.8–46.6)
HGB: 9.5 g/dL — ABNORMAL LOW (ref 11.6–15.9)
LYMPH%: 26.3 % (ref 14.0–49.7)
MCH: 26 pg (ref 25.1–34.0)
MCHC: 31.9 g/dL (ref 31.5–36.0)
MCV: 81.7 fL (ref 79.5–101.0)
MONO#: 1 10*3/uL — ABNORMAL HIGH (ref 0.1–0.9)
MONO%: 17.3 % — AB (ref 0.0–14.0)
NEUT%: 54.6 % (ref 38.4–76.8)
NEUTROS ABS: 3.3 10*3/uL (ref 1.5–6.5)
PLATELETS: 268 10*3/uL (ref 145–400)
RBC: 3.64 10*6/uL — ABNORMAL LOW (ref 3.70–5.45)
RDW: 20.5 % — AB (ref 11.2–14.5)
WBC: 6 10*3/uL (ref 3.9–10.3)
lymph#: 1.6 10*3/uL (ref 0.9–3.3)

## 2013-07-30 LAB — COMPREHENSIVE METABOLIC PANEL (CC13)
ALBUMIN: 3.3 g/dL — AB (ref 3.5–5.0)
ALT: 94 U/L — ABNORMAL HIGH (ref 0–55)
ANION GAP: 8 meq/L (ref 3–11)
AST: 156 U/L — ABNORMAL HIGH (ref 5–34)
Alkaline Phosphatase: 109 U/L (ref 40–150)
BILIRUBIN TOTAL: 0.27 mg/dL (ref 0.20–1.20)
BUN: 9.4 mg/dL (ref 7.0–26.0)
CHLORIDE: 105 meq/L (ref 98–109)
CO2: 28 mEq/L (ref 22–29)
Calcium: 8.8 mg/dL (ref 8.4–10.4)
Creatinine: 0.8 mg/dL (ref 0.6–1.1)
GLUCOSE: 164 mg/dL — AB (ref 70–140)
POTASSIUM: 3.8 meq/L (ref 3.5–5.1)
SODIUM: 141 meq/L (ref 136–145)
TOTAL PROTEIN: 6.5 g/dL (ref 6.4–8.3)

## 2013-07-30 LAB — IRON AND TIBC CHCC
%SAT: UNDETERMINED % (ref 21–?)
Iron: 569 ug/dL — ABNORMAL HIGH (ref 41–142)
TIBC: 381 ug/dL (ref 236–444)
UIBC: UNDETERMINED ug/dL (ref 120–384)

## 2013-07-30 LAB — FERRITIN CHCC: Ferritin: 655 ng/ml — ABNORMAL HIGH (ref 9–269)

## 2013-07-31 ENCOUNTER — Telehealth: Payer: Self-pay | Admitting: Internal Medicine

## 2013-07-31 NOTE — Telephone Encounter (Signed)
s.w. pt and advised on appts...mailed appt shced/avs and letter

## 2013-08-01 NOTE — Progress Notes (Signed)
[facility] OFFICE PROGRESS NOTE  [name], MD [place] [address]  DIAGNOSIS: Iron deficiency anemia secondary to blood loss (chronic) - Plan: CBC with Differential, Ferritin, Iron and TIBC, CBC with Differential, Comprehensive metabolic panel (Cmet) - [facility], Ferritin, Iron and TIBC  Secondary hemochromatosis - Plan: CBC with Differential, Ferritin, Iron and TIBC, CBC with Differential, Comprehensive metabolic panel (Cmet) - [facility], Ferritin, Iron and TIBC  Chronic hepatitis C without mention of hepatic coma  Incisional hernia, without obstruction or gangrene  Chief Complaint  Patient presents with  . Iron deficiency anemia secondary to blood loss (chronic)   PRIOR TREATMENT: The patient has been heavily dependent upon IV Feraheme. Over the past year or so she has been receiving about 1 Feraheme infusion monthly on average. The patient's most recent infusions of IV Feraheme 510 mg occurred on the following dates: 06/05/2011, 07/03/2011, 07/31/2011, 08/17/2011, 08/28/2011. The patient received IV Feraheme 1020 mg on 10/02/2011 01/30/2012, 06/06/2012 and 09/10/2012, 05/2013, 07/2013.   CURRENT TREATMENT: As noted above. Feraheme 1020 mg prn.   INTERVAL HISTORY: [name] 63 y.o. female with history of recurrent iron-deficiency anemia due to GI bleeding is here for follow-up.  She was last seen by me on 06/01/2013. GI bleeding is probably dueto AV malformations and diverticulosis.  We have been following her CBC and ferritin on a monthly basis except for the time when she was hospitalized. Since August 2013, the patient required IV Feraheme 1020 as noted above.   Today, the patient is here in a wheelchair but gets around at home with a walker. She lives alone. She has undergone a left above-the-knee amputation due to necrotizing fasciitis in October 2010. Her abdominal wound has healed but she still has a large hernia is awaiting surgery.  She reports that  she was recently diagnosed with diabetes and started on metformin and awaits adequate glycemic control prior to surgery.   MEDICAL HISTORY: Past Medical History  Diagnosis Date  . Hypertension   . Colon polyps   . Stomach problems   . Hypokalemic alkalosis   . Allergic rhinitis   . GERD (gastroesophageal reflux disease)   . Stroke 2010    no residual problems  . Anemia, iron deficiency 05/03/2011    recieves iron infusions  . Anemia   . GI bleeding   . Diverticulitis   . Transfusion history   . Difficulty sleeping     takes trazadone for sleep  . Peripheral vascular disease   . Hepatitis     hep C  . Anxiety   . Shortness of breath     due to anemia   PROBLEM LIST:  1. Recurrent iron-deficiency anemia secondary to gastrointestinal bleeding (as noted above). [name] also has required red cell transfusions in the past, most recently early December 2012. She underwent a small bowel enteroscopy by Dr. Dan Humphreys at [facility] on 08/13/2011. The enteroscope was passed up to 6 feet into the jejunum. He saw multiple AVMs, which were ablated. Two of the large AVMs bled during APC. These were in the 4th part of the duodenum very close to the ligament of Treitz. The remainder of the AVMs were in the  proximal jejunum. In addition to the small bowel AV malformations, which were treated with APC, he saw some small esophageal varices which were not bleeding and also evidence of mild portal hypertensive gastropathy.  2. History of duodenal arteriovenous malformation.  3. History of hepatitis C infection.  4. Gastroesophageal reflux disease.  5. Depression.  6. Hypertension.  7. History of right Bell's palsy.  8. Status post left above-knee amputation secondary to necrotizing fasciitis 03/06/2009.  9. Neurodermatitis.  10. Previous history of alcohol usage.  11. Admission to the hospital from 12/20/2011 through 12/25/2011 for hematochezia felt to  be secondary to a diverticular bleed, requiring 2 units of packed red cells.  12. Diverticulosis.  13. Elevated alpha fetoprotein, 72.3 on 11/19/2011 and 76.0 on 01/11/2012.  14. Abnormal MRI of the liver from 11/28/2011 showing diffuse and markedly low signal intensity throughout the liver as well as diffuse low signal intensity in the adrenal glands. 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 There were no findings for cirrhosis, portal hypertension, splenomegaly or ascites, not were there any worrisome enhancing liver lesions.  15. Systolic ejection murmur.   INTERIM HISTORY: has Essential hypertension, benign; GERD; Necrotizing fasciitis; CARPAL TUNNEL RELEASE, HX OF; Iron deficiency anemia secondary to blood loss (chronic); Chronic hepatitis C without mention of hepatic coma; Ventral hernia; Phantom limb pain; Incisional hernia, without obstruction or gangrene; Secondary hemochromatosis; Type II or unspecified type diabetes mellitus with unspecified complication, uncontrolled; Depression; and Non-healing surgical wound on her problem list.    ALLERGIES:  is allergic to ciprofloxacin; morphine and related; penicillins; codeine; and feraheme.  MEDICATIONS: has a current medication list which includes the following prescription(s): accu-chek fastclix lancets, aspirin ec, atenolol, bacitracin, accu-chek nano smartview, cetirizine, esomeprazole, fluoxetine, gabapentin, glucose blood, hydrochlorothiazide, lisinopril, oxycodone-acetaminophen, potassium chloride, magnesium (amino acid chelate), tramadol, trazodone, and vitamin c.  SURGICAL HISTORY:  Past Surgical History  Procedure Laterality Date  . Leg amputation above knee    . Esophagogastroduodenoscopy  05/05/2011    Procedure: ESOPHAGOGASTRODUODENOSCOPY (EGD);  Surgeon: Zenovia Jarred, MD;  Location: [facility];  Service: Gastroenterology;  Laterality: N/A;  Dr. Hilarie Fredrickson will do procedure for Dr. Benson Norway Saturday.   . Esophagogastroduodenoscopy  05/08/2011    Procedure: ESOPHAGOGASTRODUODENOSCOPY (EGD);  Surgeon: Beryle Beams;  Location: [facility];  Service: Endoscopy;  Laterality: N/A;  . Esophagogastroduodenoscopy  06/07/2011    Procedure: ESOPHAGOGASTRODUODENOSCOPY (EGD);  Surgeon: Beryle Beams, MD;  Location: [facility];  Service: Endoscopy;  Laterality: N/A;  . Hot hemostasis  06/07/2011    Procedure: HOT HEMOSTASIS (ARGON PLASMA COAGULATION/BICAP);  Surgeon: Beryle Beams, MD;  Location: [facility];  Service: Endoscopy;  Laterality: N/A;  . Esophagogastroduodenoscopy  12/20/2011    Procedure: ESOPHAGOGASTRODUODENOSCOPY (EGD);  Surgeon: Beryle Beams, MD;  Location: [facility];  Service: Endoscopy;  Laterality: N/A;  . Flexible sigmoidoscopy  12/21/2011    Procedure: FLEXIBLE SIGMOIDOSCOPY;  Surgeon: Beryle Beams, MD;  Location: [facility];  Service: Endoscopy;  Laterality: N/A;  . Carpal tunnel release      rt hand  . Partial colectomy  02/15/2012    Procedure: PARTIAL COLECTOMY;  Surgeon: Harl Bowie, MD;  Location: [facility];  Service: General;  Laterality: N/A;  . Laparotomy  02/16/2012    Procedure: EXPLORATORY LAPAROTOMY;  Surgeon: Edward Jolly, MD;  Location: [facility];  Service: General;  Laterality: N/A;  oversewing of anastomotic leak and rigid sigmoidoscopy  . Enteroscopy N/A 12/04/2012    Procedure: ENTEROSCOPY;  Surgeon: Beryle Beams, MD;  Location: [facility];  Service: Endoscopy;  Laterality: N/A;  . Enteroscopy N/A 12/18/2012    Procedure: ENTEROSCOPY;  Surgeon: Beryle Beams, MD;  Location: [facility];  Service: Endoscopy;  Laterality: N/A;  .  Eye surgery Bilateral     cataracts    REVIEW OF SYSTEMS:   Constitutional: Denies fevers, chills or abnormal weight loss Eyes: Denies blurriness of vision Ears, nose, mouth, throat, and face: Denies mucositis or sore throat Respiratory: Denies cough, dyspnea or wheezes Cardiovascular: Denies palpitation, chest  discomfort or lower extremity swelling Gastrointestinal:  Denies nausea, heartburn or change in bowel habits Skin: Denies abnormal skin rashes Lymphatics: Denies new lymphadenopathy or easy bruising Neurological:Denies numbness, tingling or new weaknesses Behavioral/Psych: Mood is stable, no new changes  All other systems were reviewed with the patient and are negative.  PHYSICAL EXAMINATION: ECOG PERFORMANCE STATUS: 3 - Symptomatic, >50% confined to bed  Blood pressure 122/75, pulse 67, temperature 98.4 F (36.9 C), temperature source Oral, resp. rate 19, height 5\' 3"  (1.6 m).  General appearance: alert, appears stated age, no distress and moderately obese  Head: Normocephalic, without obvious abnormality, atraumatic  Neck: no adenopathy, supple, symmetrical, trachea midline and thyroid not enlarged, symmetric, no tenderness/mass/nodules  Lymph nodes: Cervical adenopathy: None appreciated  Heart:regular rate and rhythm, S1, S2 normal and systolic murmur: systolic ejection 2/6, harsh and previously documented without radiation  Lung:chest clear, no wheezing, rales, normal symmetric air entry, Heart exam - S1, S2 normal, no murmur, no gallop, rate regular  Abdomin: soft, distended, normal bowel sounds, nontender and incisional hernia and easily reducible and nontender  EXT:No peripheral edema on right extremity; Left above the knee amputation. She is wearing a prothesis.   Labs:  Lab Results  Component Value Date   WBC 6.0 07/30/2013   HGB 9.5* 07/30/2013   HCT 29.7* 07/30/2013   MCV 81.7 07/30/2013   PLT 268 07/30/2013   NEUTROABS 3.3 07/30/2013      Chemistry      Component Value Date/Time   NA 141 07/30/2013 0942   NA 128* 04/09/2012 2333   K 3.8 07/30/2013 0942   K 3.8 04/09/2012 2333   CL 104 10/03/2012 1048   CL 92* 04/09/2012 2333   CO2 28 07/30/2013 0942   CO2 27 04/09/2012 2333   BUN 9.4 07/30/2013 0942   BUN 7 04/09/2012 2333   CREATININE 0.8 07/30/2013 0942   CREATININE 0.61  04/09/2012 2333      Component Value Date/Time   CALCIUM 8.8 07/30/2013 0942   CALCIUM 8.0* 04/10/2012 0410   ALKPHOS 109 07/30/2013 0942   ALKPHOS 57 04/09/2012 1615   AST 156* 07/30/2013 0942   AST 45* 04/09/2012 1615   ALT 94* 07/30/2013 0942   ALT 40* 04/09/2012 1615   BILITOT 0.27 07/30/2013 0942   BILITOT 0.3 04/09/2012 1615       Basic Metabolic Panel:  Recent Labs Lab 07/30/13 0942  NA 141  K 3.8  CO2 28  GLUCOSE 164*  BUN 9.4  CREATININE 0.8  CALCIUM 8.8   GFR The CrCl is unknown because both a height and weight (above a minimum accepted value) are required for this calculation. Liver Function Tests:  Recent Labs Lab 07/30/13 0942  AST 156*  ALT 94*  ALKPHOS 109  BILITOT 0.27  PROT 6.5  ALBUMIN 3.3*    CBC:  Recent Labs Lab 07/30/13 0942  WBC 6.0  NEUTROABS 3.3  HGB 9.5*  HCT 29.7*  MCV 81.7  PLT 268    Anemia work up  Recent Labs  07/30/13 0942  FERRITIN 655*  TIBC 381  IRON 569*    Studies:  No results found.   RADIOGRAPHIC STUDIES: Mm Digital Screening Bilateral  07/31/2013   CLINICAL DATA:  Screening.  EXAM: DIGITAL SCREENING BILATERAL MAMMOGRAM WITH CAD  COMPARISON:  Previous exam(s).  ACR Breast Density Category c: The breast tissue is heterogeneously dense, which may obscure small masses.  FINDINGS: There are no findings suspicious for malignancy. Images were processed with CAD.  IMPRESSION: No mammographic evidence of malignancy. A result letter of this screening mammogram will be mailed directly to the patient.  RECOMMENDATION: Screening mammogram in one year. (Code:SM-B-01Y)  BI-RADS CATEGORY  1: Negative.   Electronically Signed   By: Abelardo Diesel M.D.   On: 07/31/2013 09:01    ASSESSMENT: [name] 63 y.o. female with a history of Iron deficiency anemia secondary to blood loss (chronic) - Plan: CBC with Differential, Ferritin, Iron and TIBC, CBC with Differential, Comprehensive metabolic panel (Cmet) - [facility], Ferritin, Iron  and TIBC  Secondary hemochromatosis - Plan: CBC with Differential, Ferritin, Iron and TIBC, CBC with Differential, Comprehensive metabolic panel (Cmet) - [facility], Ferritin, Iron and TIBC  Chronic hepatitis C without mention of hepatic coma  Incisional hernia, without obstruction or gangrene   PLAN:  1. Iron deficiency anemia secondary to GI lost.  Her hemogloblin was within normal limits on 01/30/2013 and went as low as 7.2 on 06/01/2013 back to 10.4 on 07/06/2013 back to 9.5 today.   Her ferritin is 655. We will hold transfusions . We will continue to support this patient as best we can with IV iron. We are continuing to check a CBC and ferritin every month. We will plan to see Ms.  [name] again in 2 months, at which time we will check CBC, chemistries, and iron studies.   2. Elevated transiminases in the setting of an elevated ferritin likely due to secondary hemochromatosis and chronic hepatitis C.  --We will continue to hold iron infusions and follow LFTs to establish trend. We prefer to use annual liver MRI and initiate chelation if the liver iron is >3 mg/g dry weight or if the serum ferritin is >1000 ng/mL on two consecutive measurements. Her last MRI of abdomen (11/2011) demonstrated findings consistent with secondary or transfusional hemochromatosis involving the liver, spleen, adrenal glands and bone marrow. No findings for cirrhosis, portal hypertension, splenomegaly or ascites. No worrisome enhancing liver lesions. We will perform an ultrasound of the liver in continue to trend or LFTs increase or based on symptoms. Patient was counseled to avoid hepatatoxins including percocet which has tylenol. We provided her with oxycodone 5 mg instead.   3. Large incisional hernia. --This will repaired in the next several months.  We hope to also optimize her hemoglobin for surgery.   All questions were answered. The patient knows to call the clinic with any problems, questions or concerns. We can  certainly see the patient much sooner if necessary.  I spent 15 minutes counseling the patient face to face. The total time spent in the appointment was 25 minutes.    Deserai Cansler, MD 08/01/2013 4:15 PM

## 2013-08-05 ENCOUNTER — Other Ambulatory Visit: Payer: Self-pay | Admitting: Internal Medicine

## 2013-08-07 ENCOUNTER — Ambulatory Visit (INDEPENDENT_AMBULATORY_CARE_PROVIDER_SITE_OTHER): Payer: Medicare Other | Admitting: Dietician

## 2013-08-07 ENCOUNTER — Encounter: Payer: Self-pay | Admitting: Dietician

## 2013-08-07 ENCOUNTER — Other Ambulatory Visit: Payer: Self-pay | Admitting: Dietician

## 2013-08-07 VITALS — Wt 185.0 lb

## 2013-08-07 DIAGNOSIS — IMO0002 Reserved for concepts with insufficient information to code with codable children: Secondary | ICD-10-CM

## 2013-08-07 DIAGNOSIS — E118 Type 2 diabetes mellitus with unspecified complications: Principal | ICD-10-CM

## 2013-08-07 DIAGNOSIS — E1165 Type 2 diabetes mellitus with hyperglycemia: Secondary | ICD-10-CM

## 2013-08-07 NOTE — Patient Instructions (Signed)
Please check blood sugar as discussed and bring meter back to next visit.  Please check feet every day.  Make a follow up in 2-3 weeks.  [name]  [phone]

## 2013-08-07 NOTE — Progress Notes (Signed)
Diabetes Self-Management Education  Visit Number: First/Initial  08/07/2013 Ms. Estil Daft, identified by name and date of birth, is a 63 y.o. female with Diabetes Type: Type 2.  Other people present during visit:      ASSESSMENT  Patient Concerns:  Monitoring  Weight 185 lb (83.915 kg). Body mass index is 32.78 kg/(m^2).  Lab Results: LDL Cholesterol  Date Value Ref Range Status  02/22/2010 35        Total Cholesterol 0 - 99 mg/dL Final     Hemoglobin A1C  Date Value Ref Range Status  06/05/2013 6.6   Final     Family History  Problem Relation Age of Onset  . Cancer Father     unknown  . Hypertension Mother   . Diverticulitis Mother    History  Substance Use Topics  . Smoking status: Current Every Day Smoker -- 0.20 packs/day for 17 years    Types: Cigarettes  . Smokeless tobacco: Never Used     Comment: wants to quit  . Alcohol Use: Yes     Comment: Occasionally    Support Systems:    Special Needs:     Prior DM Education:   Daily Foot Exams:   Patient Belief / Attitude about Diabetes:  Diabetes can be controlled  Assessment comments: patient giving accu nano meter and 10 strips today- she tested her own blood sugar that was 227 after a chicken pot pie for lunch    Individualized Plan for Diabetes Self-Management Training:  Patient individualized diabetes plan discussed today with patient and includes: what is Diabetes, Nutrition, medications, monitoring, physical activity, how to handle highs and lows, Special care for my body when I have diabetes, Dealing daily with diabetes  Education Topics Reviewed with Patient Today:  Topic Points Discussed  Disease State    Nutrition Management    Physical Activity and Exercise    Medications    Monitoring Taught/evaluated SMBG with Accu chek Nano meter.Purpose and frequency of SMBG.;Taught/discussed recording of test results and interpretation of SMBG.;Identified appropriate SMBG and A1C goals.;Daily foot exams   Acute Complications    Chronic Complications Assessed and discussed foot care and prevention of foot problems  Psychosocial Adjustment    Goal Setting    Preconception Care (if applicable)      PATIENTS GOALS   Learning Objective(s):     Goal The patient agrees to:  Nutrition    Physical Activity    Medications    Monitoring test my blood glucose as discussed (note x per day with comment)  Problem Solving    Reducing Risk do foot checks daily  Health Coping     Patient Self-Evaluation of Goals (Subsequent Visits)  Goal The patient rates self as meeting goals (% of time)  Nutrition  Trying to eat smaller portion   Physical Activity    Medications    Monitoring    Problem Solving    Reducing Risk    Health Coping       PERSONALIZED PLAN / SUPPORT  Self-Management Support:  Doctor's office;Friends;CDE visits ______________________________________________________________________  Outcomes  Expected Outcomes:     Self-Care Barriers:  Debilitated state due to current medical condition;Lack of transportation;Lack of material resources Education material provided: yes- living with diabetes If problems or questions, patient to contact team via:  Phone Time in: 1330     Time out: 1430 Future DSME appointment: - 2 wks  Plyler, [name] 08/07/2013 5:07 PM

## 2013-08-07 NOTE — Telephone Encounter (Signed)
Needs testing supplies. Was not able to get. Do not think [facility] bills her insurance. She will need to go to retail pharmacy like [facility] to use her insurance.

## 2013-08-08 MED ORDER — ACCU-CHEK FASTCLIX LANCETS MISC
Status: DC
Start: ? — End: 2013-08-21

## 2013-08-08 MED ORDER — GLUCOSE BLOOD VI STRP: ORAL_STRIP | Status: DC

## 2013-08-21 ENCOUNTER — Other Ambulatory Visit: Payer: Self-pay | Admitting: Dietician

## 2013-08-21 ENCOUNTER — Other Ambulatory Visit: Payer: Self-pay | Admitting: *Deleted

## 2013-08-21 DIAGNOSIS — K432 Incisional hernia without obstruction or gangrene: Secondary | ICD-10-CM

## 2013-08-21 DIAGNOSIS — T8189XA Other complications of procedures, not elsewhere classified, initial encounter: Secondary | ICD-10-CM

## 2013-08-21 DIAGNOSIS — E1165 Type 2 diabetes mellitus with hyperglycemia: Secondary | ICD-10-CM

## 2013-08-21 DIAGNOSIS — E118 Type 2 diabetes mellitus with unspecified complications: Principal | ICD-10-CM

## 2013-08-21 DIAGNOSIS — IMO0002 Reserved for concepts with insufficient information to code with codable children: Secondary | ICD-10-CM

## 2013-08-21 MED ORDER — GLUCOSE BLOOD VI STRP: ORAL_STRIP | Status: DC

## 2013-08-21 MED ORDER — OXYCODONE-ACETAMINOPHEN 10-325 MG PO TABS: 1.0000 | ORAL_TABLET | Freq: Four times a day (QID) | ORAL | Status: DC | PRN

## 2013-08-21 MED ORDER — ACCU-CHEK FASTCLIX LANCETS MISC: Status: DC

## 2013-08-21 NOTE — Telephone Encounter (Signed)
Testing supplies sent to wrong pharmacy. [facility] would not transfer it [facility].

## 2013-08-21 NOTE — Telephone Encounter (Signed)
Has an appt on 3/31 to see [name] and would like to pick it up on this date.

## 2013-08-25 ENCOUNTER — Ambulatory Visit (INDEPENDENT_AMBULATORY_CARE_PROVIDER_SITE_OTHER): Payer: Medicare Other | Admitting: Dietician

## 2013-08-25 ENCOUNTER — Encounter: Payer: Self-pay | Admitting: Dietician

## 2013-08-25 VITALS — BP 154/91 | Wt 185.1 lb

## 2013-08-25 DIAGNOSIS — E119 Type 2 diabetes mellitus without complications: Secondary | ICD-10-CM

## 2013-08-25 DIAGNOSIS — IMO0002 Reserved for concepts with insufficient information to code with codable children: Secondary | ICD-10-CM

## 2013-08-25 DIAGNOSIS — E1165 Type 2 diabetes mellitus with hyperglycemia: Secondary | ICD-10-CM

## 2013-08-25 DIAGNOSIS — E118 Type 2 diabetes mellitus with unspecified complications: Principal | ICD-10-CM

## 2013-08-25 NOTE — Patient Instructions (Signed)
Please make a follow up with [name] for continued diabetes training in 1 month.  Your goal is to eat less fat(1% milk), cookies, chips and more fruit, yogurt, grapes, applesauce, bananas and peanut butter and other snacks on the list.  Your blood sugar should be less than 130 before eating breakfast.   If you decrease your weight to less than 180# your blood sugars will probably be better.   Keep checking your feet, eating healthy and exercising.!

## 2013-08-25 NOTE — Progress Notes (Signed)
Medical Nutrition Therapy:  Appt start time: 0830 end time:  0930.  Assessment:  Primary concerns today: Blood sugar control.  Patient met DSMT goal of self foot check daily and continues to do this. Asks about diabetic shoes. Doesn't recall how eating affects blood sugar numbers and request review.  Usual eating pattern includes 3 meals and 1-2 snacks per day. Frequent foods include vegetables- greens, asparagus, cauliflower baked [place] necks and wings with cream of mushroom soup (doesn't eat skin), chips and cookies, bananas and peanut butter and jelly, bottled water .  Avoided foods include hard to chew fruits and raw vegetables.   24-hr recall: B ( 9-11-AM)- hard boiled egg, bran cereal and 2% milk     L ( 12 PM)-half of shrimp creole on rice, diet coke Snk ( PM)- fritos and or a little debbie  D ( PM)- the other half of shrimp creole over rice, water  Diabetes medicine- discussed with patient option of discussing adding medicine. She prefers to wait another month. Discussed weight loss of 5%- 7% ( goal agreed upon was < 180# ) physical activity includes leg exercises and walks for 20 minutes or more daily.. Self monitoring- out of strips says she did not have $ but they many not cost her anything which was explained to patient) Has mastered the technique, but not how to use the information to improve diabetes control of change food or activity. she's like to test twice a day, Current reading today 215 fasting, 199. 191, 175, 144, 175, one > 300.   Progress Towards Goal(s):  In progress.   Nutritional Diagnosis:  NB-1.1 Food and nutrition-related knowledge deficit As related to lack of prior exposure to diabetes meal planning and need for repetition.  As evidenced by her request for reveiw and new diabetes diagnosis.    Intervention:  Nutrition education about healthy food choices for snacks, plate method for meals. Review of what fasting blood sugars should be , preventative care for her  diabetes- eye exam, lipids and microalbumin she says will  Be checked at lab at cancer center this week Coordination of care- request Rx for diabetic shoes be mailed to patient.  Monitoring/Evaluation:  Dietary intake, exercise, meter, logbook, and body weight in 4 week(s).

## 2013-08-26 ENCOUNTER — Telehealth: Payer: Self-pay | Admitting: Internal Medicine

## 2013-08-26 NOTE — Telephone Encounter (Signed)
Called Dr. Zenia Resides office.  This patient had surgery and F/u on 03/30/2013 and 04/20/2013.  All Visits being faxed from 03/04/2013-07/07/2013 .

## 2013-08-27 ENCOUNTER — Other Ambulatory Visit: Payer: Self-pay | Admitting: Internal Medicine

## 2013-08-27 ENCOUNTER — Other Ambulatory Visit: Payer: Medicare Other

## 2013-08-28 ENCOUNTER — Other Ambulatory Visit: Payer: Self-pay

## 2013-09-01 ENCOUNTER — Other Ambulatory Visit (INDEPENDENT_AMBULATORY_CARE_PROVIDER_SITE_OTHER): Payer: Self-pay | Admitting: Surgery

## 2013-09-01 NOTE — Telephone Encounter (Signed)
Can this patient have this Rx. Last seen was back in 05-2013

## 2013-09-02 ENCOUNTER — Other Ambulatory Visit (HOSPITAL_BASED_OUTPATIENT_CLINIC_OR_DEPARTMENT_OTHER): Payer: Medicare Other

## 2013-09-02 DIAGNOSIS — D500 Iron deficiency anemia secondary to blood loss (chronic): Secondary | ICD-10-CM | POA: Diagnosis not present

## 2013-09-02 LAB — CBC WITH DIFFERENTIAL/PLATELET
BASO%: 0.3 % (ref 0.0–2.0)
Basophils Absolute: 0 10*3/uL (ref 0.0–0.1)
EOS%: 1.2 % (ref 0.0–7.0)
Eosinophils Absolute: 0.1 10*3/uL (ref 0.0–0.5)
HCT: 34 % — ABNORMAL LOW (ref 34.8–46.6)
HGB: 11.1 g/dL — ABNORMAL LOW (ref 11.6–15.9)
LYMPH%: 31.1 % (ref 14.0–49.7)
MCH: 28.6 pg (ref 25.1–34.0)
MCHC: 32.6 g/dL (ref 31.5–36.0)
MCV: 87.6 fL (ref 79.5–101.0)
MONO#: 0.8 10*3/uL (ref 0.1–0.9)
MONO%: 12.8 % (ref 0.0–14.0)
NEUT#: 3.4 10*3/uL (ref 1.5–6.5)
NEUT%: 54.6 % (ref 38.4–76.8)
PLATELETS: 241 10*3/uL (ref 145–400)
RBC: 3.88 10*6/uL (ref 3.70–5.45)
RDW: 20.6 % — ABNORMAL HIGH (ref 11.2–14.5)
WBC: 6.3 10*3/uL (ref 3.9–10.3)
lymph#: 2 10*3/uL (ref 0.9–3.3)

## 2013-09-02 LAB — IRON AND TIBC CHCC
%SAT: 10 % — ABNORMAL LOW (ref 21–57)
Iron: 44 ug/dL (ref 41–142)
TIBC: 433 ug/dL (ref 236–444)
UIBC: 389 ug/dL — ABNORMAL HIGH (ref 120–384)

## 2013-09-02 LAB — FERRITIN CHCC: Ferritin: 32 ng/ml (ref 9–269)

## 2013-09-06 ENCOUNTER — Other Ambulatory Visit: Payer: Self-pay | Admitting: Internal Medicine

## 2013-09-07 ENCOUNTER — Telehealth (INDEPENDENT_AMBULATORY_CARE_PROVIDER_SITE_OTHER): Payer: Self-pay | Admitting: General Surgery

## 2013-09-07 ENCOUNTER — Telehealth: Payer: Self-pay | Admitting: Internal Medicine

## 2013-09-07 NOTE — Telephone Encounter (Signed)
Called patient to let her know that she has a Rx for ultram 50 mg with no refill. Patient is aware that the Rx is at the check in desk

## 2013-09-07 NOTE — Telephone Encounter (Signed)
per pof pt needs fera inf on 4/15-sent to MW to sch

## 2013-09-07 NOTE — Telephone Encounter (Signed)
I called patient back to let her know that we can not call in pain med, she will come back sometime his week to pick up the Rx. I told her that it the law that we can not call in any pain meds

## 2013-09-08 ENCOUNTER — Telehealth: Payer: Self-pay | Admitting: Internal Medicine

## 2013-09-08 ENCOUNTER — Telehealth: Payer: Self-pay | Admitting: *Deleted

## 2013-09-08 NOTE — Telephone Encounter (Signed)
per pof needed ins sch 4/15-cld pt to adv of new appt & time-pt understood

## 2013-09-08 NOTE — Telephone Encounter (Signed)
Per staff message and POF I have scheduled appts.  JMW  

## 2013-09-09 ENCOUNTER — Ambulatory Visit (HOSPITAL_BASED_OUTPATIENT_CLINIC_OR_DEPARTMENT_OTHER): Payer: Medicare Other

## 2013-09-09 VITALS — BP 154/81 | HR 67 | Temp 98.5°F | Resp 20

## 2013-09-09 DIAGNOSIS — D500 Iron deficiency anemia secondary to blood loss (chronic): Secondary | ICD-10-CM | POA: Diagnosis not present

## 2013-09-09 MED ORDER — DIPHENHYDRAMINE HCL 50 MG/ML IJ SOLN
INTRAMUSCULAR | Status: AC
  Filled 2013-09-09: qty 1

## 2013-09-09 MED ORDER — METHYLPREDNISOLONE SODIUM SUCC 125 MG IJ SOLR
80.0000 mg | Freq: Once | INTRAMUSCULAR | Status: AC
  Administered 2013-09-09: 80 mg via INTRAVENOUS

## 2013-09-09 MED ORDER — METHYLPREDNISOLONE SODIUM SUCC 40 MG IJ SOLR
INTRAMUSCULAR | Status: AC
  Filled 2013-09-09: qty 2

## 2013-09-09 MED ORDER — SODIUM CHLORIDE 0.9 % IV SOLN
510.0000 mg | Freq: Once | INTRAVENOUS | Status: AC
  Administered 2013-09-09: 510 mg via INTRAVENOUS
  Filled 2013-09-09: qty 17

## 2013-09-09 MED ORDER — DIPHENHYDRAMINE HCL 50 MG/ML IJ SOLN
25.0000 mg | Freq: Once | INTRAMUSCULAR | Status: AC
  Administered 2013-09-09: 25 mg via INTRAVENOUS

## 2013-09-09 MED ORDER — SODIUM CHLORIDE 0.9 % IV SOLN
INTRAVENOUS | Status: DC
  Administered 2013-09-09: 14:00:00 via INTRAVENOUS

## 2013-09-09 NOTE — Patient Instructions (Signed)

## 2013-09-17 DIAGNOSIS — Z961 Presence of intraocular lens: Secondary | ICD-10-CM | POA: Diagnosis not present

## 2013-09-17 DIAGNOSIS — H40019 Open angle with borderline findings, low risk, unspecified eye: Secondary | ICD-10-CM | POA: Diagnosis not present

## 2013-09-17 DIAGNOSIS — H26499 Other secondary cataract, unspecified eye: Secondary | ICD-10-CM | POA: Diagnosis not present

## 2013-09-17 DIAGNOSIS — E119 Type 2 diabetes mellitus without complications: Secondary | ICD-10-CM | POA: Diagnosis not present

## 2013-09-18 ENCOUNTER — Encounter: Payer: Self-pay | Admitting: Dietician

## 2013-09-18 ENCOUNTER — Ambulatory Visit (INDEPENDENT_AMBULATORY_CARE_PROVIDER_SITE_OTHER): Payer: Medicare Other | Admitting: Dietician

## 2013-09-18 ENCOUNTER — Ambulatory Visit (INDEPENDENT_AMBULATORY_CARE_PROVIDER_SITE_OTHER): Payer: Medicare Other | Admitting: Internal Medicine

## 2013-09-18 ENCOUNTER — Encounter: Payer: Self-pay | Admitting: Internal Medicine

## 2013-09-18 VITALS — BP 163/86 | HR 70 | Temp 98.7°F | Wt 183.8 lb

## 2013-09-18 DIAGNOSIS — F3289 Other specified depressive episodes: Secondary | ICD-10-CM | POA: Diagnosis not present

## 2013-09-18 DIAGNOSIS — F329 Major depressive disorder, single episode, unspecified: Secondary | ICD-10-CM

## 2013-09-18 DIAGNOSIS — E118 Type 2 diabetes mellitus with unspecified complications: Principal | ICD-10-CM

## 2013-09-18 DIAGNOSIS — K432 Incisional hernia without obstruction or gangrene: Secondary | ICD-10-CM

## 2013-09-18 DIAGNOSIS — S8990XA Unspecified injury of unspecified lower leg, initial encounter: Secondary | ICD-10-CM | POA: Diagnosis not present

## 2013-09-18 DIAGNOSIS — E1165 Type 2 diabetes mellitus with hyperglycemia: Secondary | ICD-10-CM

## 2013-09-18 DIAGNOSIS — E119 Type 2 diabetes mellitus without complications: Secondary | ICD-10-CM | POA: Diagnosis not present

## 2013-09-18 DIAGNOSIS — IMO0002 Reserved for concepts with insufficient information to code with codable children: Secondary | ICD-10-CM

## 2013-09-18 DIAGNOSIS — S99919A Unspecified injury of unspecified ankle, initial encounter: Secondary | ICD-10-CM | POA: Diagnosis not present

## 2013-09-18 DIAGNOSIS — T8189XA Other complications of procedures, not elsewhere classified, initial encounter: Secondary | ICD-10-CM

## 2013-09-18 DIAGNOSIS — F32A Depression, unspecified: Secondary | ICD-10-CM

## 2013-09-18 LAB — GLUCOSE, CAPILLARY: Glucose-Capillary: 215 mg/dL — ABNORMAL HIGH (ref 70–99)

## 2013-09-18 LAB — POCT GLYCOSYLATED HEMOGLOBIN (HGB A1C): HEMOGLOBIN A1C: 6.2

## 2013-09-18 MED ORDER — METFORMIN HCL 500 MG PO TABS: 500.0000 mg | ORAL_TABLET | Freq: Every day | ORAL | Status: DC

## 2013-09-18 MED ORDER — OXYCODONE-ACETAMINOPHEN 10-325 MG PO TABS: 1.0000 | ORAL_TABLET | Freq: Four times a day (QID) | ORAL | Status: DC | PRN

## 2013-09-18 MED ORDER — FLUOXETINE HCL 20 MG PO TABS: 40.0000 mg | ORAL_TABLET | Freq: Every day | ORAL | Status: DC

## 2013-09-18 NOTE — Progress Notes (Signed)
Subjective:    Patient ID: [name], female    DOB: 09-15-1950, 63 y.o.   MRN: [id number]  HPI [name] is a 63 yo woman pmh as listed below presents for status of her diabetes.   Pt has been meeting with the diabetes educator to help facilitate good lifestyle modifications. She brings in her CBG meter that display highest of 292 and lowest of 170. She has not experienced any hypoglycemic symptoms. She has been checking her sugar mostly in the morning before her meal and only occasionally at night. She feels that she can restrict the need to eat to to keep her sugars in check. The patient has been very resistant to starting therapy and has extreme anxiety given some traumatic family members experiences with uncontrolled DM. Despite having witnessed the detrimental effects of uncontrolled DM the pt has been resistant to using/starting medications until today. After being displayed with some data in regards to her inability to control her DM on her own without the assistance of medications the patient seems willing and amenable to starting medication.    She has very strong family hx with several family members with advanced sequale of DM and whom have died as a result of DM. She has been taking some of her nieces' medication of metformin to help and says she has tolerated it well. She is very tearful and describes severe anxiety with spontaneous crying when thinking of her diagnosis. She has increased her prosac by taking double the current dose and finds it is helping. She is determined and more motivated to achieve glycemic control to be able to perform ventral hernia repair.   Review of Systems  Constitutional: Negative for fever, activity change, appetite change, fatigue and unexpected weight change.  Respiratory: Negative for cough, chest tightness and shortness of breath.   Cardiovascular: Negative for chest pain, palpitations and leg swelling.  Gastrointestinal: Negative for nausea,  vomiting, abdominal pain, diarrhea, blood in stool and abdominal distention.  Endocrine: Negative for polydipsia and polyuria.  Neurological: Negative for dizziness and weakness.  Psychiatric/Behavioral: Negative for suicidal ideas, hallucinations, sleep disturbance, self-injury and agitation. The patient is nervous/anxious.     Past Medical History  Diagnosis Date  . Hypertension   . Colon polyps   . Stomach problems   . Hypokalemic alkalosis   . Allergic rhinitis   . GERD (gastroesophageal reflux disease)   . Stroke 2010    no residual problems  . Anemia, iron deficiency 05/03/2011    recieves iron infusions  . Anemia   . GI bleeding   . Diverticulitis   . Transfusion history   . Difficulty sleeping     takes trazadone for sleep  . Peripheral vascular disease   . Hepatitis     hep C  . Anxiety   . Shortness of breath     due to anemia   Social, surgical, family history reviewed with patient and updated in appropriate chart locations.     Objective:   Physical Exam Filed Vitals:   09/18/13 0945  BP: 163/86  Pulse: 70  Temp: 98.7 F (37.1 C)   General: sitting in wheelchair, very tearful and nervous during interview HEENT: PERRL, EOMI, no scleral icterus Cardiac: RRR, no rubs, murmurs or gallops Pulm: clear to auscultation bilaterally, moving normal volumes of air Abd: soft, nontender, abdominal incision with good pink granulation tissue, reducible/large paraumbilical hernia,  BS present Ext: warm and well perfused, no pedal edema, L BKA Neuro: alert  and oriented X3, cranial nerves II-XII grossly intact Psych: anxious in regards to health condition     Assessment & Plan:  Please see problem oriented charting  Pt discussed with Dr. Dareen Piano

## 2013-09-18 NOTE — Assessment & Plan Note (Addendum)
Pt current HgbA1c 6.2 but this is assumed to be falsely low as the pt has significant IDA that requires frequent blood transfusions managed by Heme/onc and CBG run mostly in the 200s. Pt has been reluctant to start medications until today and has been actually starting to take her niece medications. In terms of determining goal for the patient going by CBG readings maybe more accurate than a specific HgbA1c level.  -metformin 500mg  qd for 1 wk then increase to BID -f/u in 3-4 wks for possible further titration -will need to check CBGs at least BID and pt was educated on importance of doing this -cont DM educator visits -pt should have microalbumin at next visit along with lipid panel (these have been ordered if the patient comes in before next appt)

## 2013-09-18 NOTE — Assessment & Plan Note (Signed)
>>  ASSESSMENT AND PLAN FOR TYPE 2 DIABETES MELLITUS WITH DIABETIC NEUROPATHY ([facility]) WRITTEN ON 09/18/2013 12:58 PM BY MONTY BAIL, MD  Pt current HgbA1c 6.2 but this is assumed to be falsely low as the pt has significant IDA that requires frequent blood transfusions managed by Heme/onc and CBG run mostly in the 200s. Pt has been reluctant to start medications until today and has been actually starting to take her niece medications. In terms of determining goal for the patient going by CBG readings maybe more accurate than a specific HgbA1c level.  -metformin  500mg  qd for 1 wk then increase to BID -f/u in 3-4 wks for possible further titration -will need to check CBGs at least BID and pt was educated on importance of doing this -cont DM educator visits -pt should have microalbumin at next visit along with lipid panel (these have been ordered if the patient comes in before next appt)

## 2013-09-18 NOTE — Progress Notes (Signed)
Medical Nutrition Therapy:  Appt start time: 1030 end time:  1100.  Assessment:  Primary concerns today: Blood sugar control.   [name] is in for a visit with her doctor and a brief visit with CDE. She is drinking 1% milk, more fruit and yogurt instead of chips and cookies. Weight decreased 1.4# in 3 weeks. ( goal agreed upon was < 180# ) Reports that she bought her last pack of cigarettes on Sunday- ready to quit. Brought meter and logbook. Needed review of goal for fasting blood sugars.  Diabetes medicine- patient requested medicine to assist her with blood sugar control.  Physical activity includes leg exercises and walks for 20 minutes or more daily. Self monitoring- meter downloaded- readings high 100s and 200s fasting and throughout the day. A1C 6.2- ? diluted by anemia and it's treatment  Progress Towards Goal(s):  In progress.   Nutritional Diagnosis:  NB-1.1 Food and nutrition-related knowledge deficit As related to lack of prior exposure to diabetes meal planning and need for repetition is gradually improving  As evidenced by her request for reveiw and knowing that her blood sugars are too high.    Intervention:  Nutrition education about [place] quit line- referral faxed. Encouraged healthy habits to replace smoking.  Review of goal for fasting blood sugars Monitoring/Evaluation:  Dietary intake, exercise, meter, logbook, and body weight in 4 week(s).

## 2013-09-18 NOTE — Progress Notes (Signed)
INTERNAL MEDICINE TEACHING ATTENDING ADDENDUM - Trendon Zaring, MD: I reviewed and discussed at the time of visit with the resident Dr. Sadek, the patient's medical history, physical examination, diagnosis and results of tests and treatment and I agree with the patient's care as documented. 

## 2013-09-18 NOTE — Assessment & Plan Note (Signed)
Pt states that she has self increased her prosac given recent niece death during the last 2 weeks in a car accident and her severe anxiety regarding her diagnosis of DM. She is still resistant to seeing a therapist.  -cont to monitor

## 2013-09-18 NOTE — Patient Instructions (Signed)
It is so good to see you and your hard work on your diabetes!   We will start some medicine:   Metformin for 1 week take 500mg  with breakfast  Metformin 500 mg for breakfast and dinner   Then we will see you back in 3-4 weeks to see how things are going.

## 2013-09-25 ENCOUNTER — Other Ambulatory Visit: Payer: Self-pay | Admitting: Internal Medicine

## 2013-09-25 ENCOUNTER — Other Ambulatory Visit (INDEPENDENT_AMBULATORY_CARE_PROVIDER_SITE_OTHER): Payer: Self-pay | Admitting: Surgery

## 2013-09-25 NOTE — Telephone Encounter (Signed)
Can this patient have this Rx 

## 2013-09-30 ENCOUNTER — Other Ambulatory Visit: Payer: Self-pay | Admitting: Internal Medicine

## 2013-09-30 ENCOUNTER — Other Ambulatory Visit (INDEPENDENT_AMBULATORY_CARE_PROVIDER_SITE_OTHER): Payer: Self-pay | Admitting: Surgery

## 2013-10-01 ENCOUNTER — Ambulatory Visit (HOSPITAL_BASED_OUTPATIENT_CLINIC_OR_DEPARTMENT_OTHER): Payer: Medicare Other | Admitting: Internal Medicine

## 2013-10-01 ENCOUNTER — Other Ambulatory Visit (HOSPITAL_BASED_OUTPATIENT_CLINIC_OR_DEPARTMENT_OTHER): Payer: Medicare Other

## 2013-10-01 ENCOUNTER — Encounter: Payer: Self-pay | Admitting: Internal Medicine

## 2013-10-01 ENCOUNTER — Ambulatory Visit (HOSPITAL_BASED_OUTPATIENT_CLINIC_OR_DEPARTMENT_OTHER): Payer: Medicare Other

## 2013-10-01 VITALS — BP 144/78 | HR 77 | Temp 97.6°F | Resp 18 | Ht 63.0 in | Wt 177.9 lb

## 2013-10-01 DIAGNOSIS — R740 Nonspecific elevation of levels of transaminase and lactic acid dehydrogenase [LDH]: Secondary | ICD-10-CM

## 2013-10-01 DIAGNOSIS — K922 Gastrointestinal hemorrhage, unspecified: Secondary | ICD-10-CM

## 2013-10-01 DIAGNOSIS — R7401 Elevation of levels of liver transaminase levels: Secondary | ICD-10-CM

## 2013-10-01 DIAGNOSIS — D500 Iron deficiency anemia secondary to blood loss (chronic): Secondary | ICD-10-CM

## 2013-10-01 DIAGNOSIS — R7402 Elevation of levels of lactic acid dehydrogenase (LDH): Secondary | ICD-10-CM | POA: Diagnosis not present

## 2013-10-01 DIAGNOSIS — E119 Type 2 diabetes mellitus without complications: Secondary | ICD-10-CM

## 2013-10-01 LAB — CBC WITH DIFFERENTIAL/PLATELET
BASO%: 0.2 % (ref 0.0–2.0)
BASOS ABS: 0 10*3/uL (ref 0.0–0.1)
EOS ABS: 0.1 10*3/uL (ref 0.0–0.5)
EOS%: 1.6 % (ref 0.0–7.0)
HCT: 24.2 % — ABNORMAL LOW (ref 34.8–46.6)
HGB: 7.6 g/dL — ABNORMAL LOW (ref 11.6–15.9)
LYMPH%: 21.8 % (ref 14.0–49.7)
MCH: 28 pg (ref 25.1–34.0)
MCHC: 31.4 g/dL — ABNORMAL LOW (ref 31.5–36.0)
MCV: 89.3 fL (ref 79.5–101.0)
MONO#: 0.7 10*3/uL (ref 0.1–0.9)
MONO%: 15.8 % — AB (ref 0.0–14.0)
NEUT%: 60.6 % (ref 38.4–76.8)
NEUTROS ABS: 2.7 10*3/uL (ref 1.5–6.5)
PLATELETS: 333 10*3/uL (ref 145–400)
RBC: 2.71 10*6/uL — ABNORMAL LOW (ref 3.70–5.45)
RDW: 19.6 % — AB (ref 11.2–14.5)
WBC: 4.4 10*3/uL (ref 3.9–10.3)
lymph#: 1 10*3/uL (ref 0.9–3.3)

## 2013-10-01 LAB — IRON AND TIBC CHCC
%SAT: 5 % — AB (ref 21–57)
IRON: 20 ug/dL — AB (ref 41–142)
TIBC: 428 ug/dL (ref 236–444)
UIBC: 407 ug/dL — AB (ref 120–384)

## 2013-10-01 LAB — COMPREHENSIVE METABOLIC PANEL (CC13)
ALT: 64 U/L — ABNORMAL HIGH (ref 0–55)
AST: 94 U/L — ABNORMAL HIGH (ref 5–34)
Albumin: 3.1 g/dL — ABNORMAL LOW (ref 3.5–5.0)
Alkaline Phosphatase: 97 U/L (ref 40–150)
Anion Gap: 8 meq/L (ref 3–11)
BUN: 14.1 mg/dL (ref 7.0–26.0)
CO2: 24 meq/L (ref 22–29)
Calcium: 8.4 mg/dL (ref 8.4–10.4)
Chloride: 106 meq/L (ref 98–109)
Creatinine: 0.8 mg/dL (ref 0.6–1.1)
Glucose: 282 mg/dL — ABNORMAL HIGH (ref 70–140)
Potassium: 4 meq/L (ref 3.5–5.1)
Sodium: 138 meq/L (ref 136–145)
Total Bilirubin: 0.21 mg/dL (ref 0.20–1.20)
Total Protein: 6 g/dL — ABNORMAL LOW (ref 6.4–8.3)

## 2013-10-01 LAB — FERRITIN CHCC: Ferritin: 62 ng/mL (ref 9–269)

## 2013-10-01 MED ORDER — DIPHENHYDRAMINE HCL 50 MG/ML IJ SOLN
25.0000 mg | Freq: Once | INTRAMUSCULAR | Status: AC
  Administered 2013-10-01: 25 mg via INTRAVENOUS

## 2013-10-01 MED ORDER — SODIUM CHLORIDE 0.9 % IV SOLN
1020.0000 mg | Freq: Once | INTRAVENOUS | Status: AC
  Administered 2013-10-01: 1020 mg via INTRAVENOUS
  Filled 2013-10-01: qty 34

## 2013-10-01 MED ORDER — METHYLPREDNISOLONE SODIUM SUCC 125 MG IJ SOLR
INTRAMUSCULAR | Status: AC
  Filled 2013-10-01: qty 2

## 2013-10-01 MED ORDER — METHYLPREDNISOLONE SODIUM SUCC 125 MG IJ SOLR
80.0000 mg | Freq: Once | INTRAMUSCULAR | Status: AC
  Administered 2013-10-01: 80 mg via INTRAVENOUS

## 2013-10-01 MED ORDER — DIPHENHYDRAMINE HCL 50 MG/ML IJ SOLN
INTRAMUSCULAR | Status: AC
  Filled 2013-10-01: qty 1

## 2013-10-01 MED ORDER — SODIUM CHLORIDE 0.9 % IV SOLN
Freq: Once | INTRAVENOUS | Status: AC
  Administered 2013-10-01: 11:00:00 via INTRAVENOUS

## 2013-10-01 NOTE — Patient Instructions (Signed)

## 2013-10-01 NOTE — Progress Notes (Signed)
[facility] OFFICE PROGRESS NOTE  [name], MD [place] [address]  DIAGNOSIS: Iron deficiency anemia secondary to blood loss (chronic) - Plan: methylPREDNISolone sodium succinate (SOLU-MEDROL) 125 mg/2 mL injection 80 mg, CBC with Differential in 1 month, CBC with Differential in 2 months, Comprehensive metabolic panel, Ferritin, Ferritin, Iron and TIBC, Iron and TIBC  Chief Complaint  Patient presents with  . Iron deficiency anemia secondary to blood loss (chronic)   PRIOR TREATMENT: The patient has been heavily dependent upon IV Feraheme. Over the past year or so she has been receiving about 1 Feraheme infusion monthly on average. The patient's most recent infusions of IV Feraheme 510 mg occurred on the following dates: 06/05/2011, 07/03/2011, 07/31/2011, 08/17/2011, 08/28/2011. The patient received IV Feraheme 1020 mg on 10/02/2011 01/30/2012, 06/06/2012 and 09/10/2012, 05/2013, 07/2013, 08/2013.Marland Kitchen   CURRENT TREATMENT: As noted above. Feraheme 1020 mg prn.   INTERVAL HISTORY: [name] 63 y.o. female with history of recurrent iron-deficiency anemia due to GI bleeding is here for follow-up.  She was last seen by me on 07/30/2013. GI bleeding is probably due to AV malformations and diverticulosis.  We have been following her CBC and ferritin on a monthly basis except for the time when she was hospitalized. Since August 2013, the patient required IV Feraheme 1020 as noted above.   Today, the patient is here in a wheelchair but gets around at home with a walker. She lives alone. She has undergone a left above-the-knee amputation due to necrotizing fasciitis in October 2010. Her abdominal wound has healed but she still has a large hernia but surgery will need to be delayed due to newly diabetes.  She reports today that her energy is low and she reports worsening fatigue.   MEDICAL HISTORY: Past Medical History  Diagnosis Date  . Hypertension   . Colon polyps    . Stomach problems   . Hypokalemic alkalosis   . Allergic rhinitis   . GERD (gastroesophageal reflux disease)   . Stroke 2010    no residual problems  . Anemia, iron deficiency 05/03/2011    recieves iron infusions  . Anemia   . GI bleeding   . Diverticulitis   . Transfusion history   . Difficulty sleeping     takes trazadone for sleep  . Peripheral vascular disease   . Hepatitis     hep C  . Anxiety   . Shortness of breath     due to anemia   PROBLEM LIST:  1. Recurrent iron-deficiency anemia secondary to gastrointestinal bleeding (as noted above). [name] also has required red cell transfusions in the past, most recently early December 2012. She underwent a small bowel enteroscopy by Dr. Dan Humphreys at [facility] on 08/13/2011. The enteroscope was passed up to 6 feet into the jejunum. He saw multiple AVMs, which were ablated. Two of the large AVMs bled during APC. These were in the 4th part of the duodenum very close to the ligament of Treitz. The remainder of the AVMs were in the  proximal jejunum. In addition to the small bowel AV malformations, which were treated with APC, he saw some small esophageal varices which were not bleeding and also evidence of mild portal hypertensive gastropathy.  2. History of duodenal arteriovenous malformation.  3. History of hepatitis C infection.  4. Gastroesophageal reflux disease.  5. Depression.  6. Hypertension.  7. History of right Bell's palsy.  8.  Status post left above-knee amputation secondary to necrotizing fasciitis 03/06/2009.  9. Neurodermatitis.  10. Previous history of alcohol usage.  11. Admission to the hospital from 12/20/2011 through 12/25/2011 for hematochezia felt to be secondary to a diverticular bleed, requiring 2 units of packed red cells.  12. Diverticulosis.  13. Elevated alpha fetoprotein, 72.3 on 11/19/2011 and 76.0 on 01/11/2012.  14. Abnormal MRI of the liver from  11/28/2011 showing diffuse and markedly low signal intensity throughout the liver as well as diffuse low signal intensity in the adrenal glands. 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 There were no findings for cirrhosis, portal hypertension, splenomegaly or ascites, not were there any worrisome enhancing liver lesions.  15. Systolic ejection murmur.   INTERIM HISTORY: has Essential hypertension, benign; GERD; Necrotizing fasciitis; CARPAL TUNNEL RELEASE, HX OF; Iron deficiency anemia secondary to blood loss (chronic); Chronic hepatitis C without mention of hepatic coma; Ventral hernia; Phantom limb pain; Incisional hernia, without obstruction or gangrene; Secondary hemochromatosis; Type II or unspecified type diabetes mellitus with unspecified complication, uncontrolled; Depression; and Non-healing surgical wound on her problem list.    ALLERGIES:  is allergic to ciprofloxacin; morphine and related; penicillins; codeine; and feraheme.  MEDICATIONS: has a current medication list which includes the following prescription(s): accu-chek fastclix lancets, aspirin ec, atenolol, accu-chek nano smartview, cetirizine, esomeprazole, fluoxetine, gabapentin, glucose blood, hydrochlorothiazide, oxycodone-acetaminophen, potassium chloride, magnesium (amino acid chelate), tramadol, trazodone, vitamin c, lisinopril, and metformin.  SURGICAL HISTORY:  Past Surgical History  Procedure Laterality Date  . Leg amputation above knee    . Esophagogastroduodenoscopy  05/05/2011    Procedure: ESOPHAGOGASTRODUODENOSCOPY (EGD);  Surgeon: Zenovia Jarred, MD;  Location: [facility];  Service: Gastroenterology;  Laterality: N/A;  Dr. Hilarie Fredrickson will do procedure for Dr. Benson Norway Saturday.  . Esophagogastroduodenoscopy  05/08/2011    Procedure: ESOPHAGOGASTRODUODENOSCOPY (EGD);  Surgeon: Beryle Beams;  Location: [facility];  Service: Endoscopy;  Laterality: N/A;  . Esophagogastroduodenoscopy   06/07/2011    Procedure: ESOPHAGOGASTRODUODENOSCOPY (EGD);  Surgeon: Beryle Beams, MD;  Location: [facility];  Service: Endoscopy;  Laterality: N/A;  . Hot hemostasis  06/07/2011    Procedure: HOT HEMOSTASIS (ARGON PLASMA COAGULATION/BICAP);  Surgeon: Beryle Beams, MD;  Location: [facility];  Service: Endoscopy;  Laterality: N/A;  . Esophagogastroduodenoscopy  12/20/2011    Procedure: ESOPHAGOGASTRODUODENOSCOPY (EGD);  Surgeon: Beryle Beams, MD;  Location: [facility];  Service: Endoscopy;  Laterality: N/A;  . Flexible sigmoidoscopy  12/21/2011    Procedure: FLEXIBLE SIGMOIDOSCOPY;  Surgeon: Beryle Beams, MD;  Location: [facility];  Service: Endoscopy;  Laterality: N/A;  . Carpal tunnel release      rt hand  . Partial colectomy  02/15/2012    Procedure: PARTIAL COLECTOMY;  Surgeon: Harl Bowie, MD;  Location: [facility];  Service: General;  Laterality: N/A;  . Laparotomy  02/16/2012    Procedure: EXPLORATORY LAPAROTOMY;  Surgeon: Edward Jolly, MD;  Location: [facility];  Service: General;  Laterality: N/A;  oversewing of anastomotic leak and rigid sigmoidoscopy  . Enteroscopy N/A 12/04/2012    Procedure: ENTEROSCOPY;  Surgeon: Beryle Beams, MD;  Location: [facility];  Service: Endoscopy;  Laterality: N/A;  . Enteroscopy N/A 12/18/2012    Procedure: ENTEROSCOPY;  Surgeon: Beryle Beams, MD;  Location: [facility];  Service: Endoscopy;  Laterality: N/A;  . Eye surgery Bilateral     cataracts    REVIEW OF SYSTEMS:   Constitutional: Denies fevers, chills or abnormal weight loss; Reports increased  fatigue.  Eyes: Denies blurriness of vision Ears, nose, mouth, throat, and face: Denies mucositis or sore throat Respiratory: Denies cough, dyspnea or wheezes Cardiovascular: Denies palpitation, chest discomfort or lower extremity swelling Gastrointestinal:  Denies nausea, heartburn or change in bowel habits Skin: Denies abnormal skin rashes Lymphatics: Denies new lymphadenopathy  or easy bruising Neurological:Denies numbness, tingling or new weaknesses Behavioral/Psych: Mood is stable, no new changes  All other systems were reviewed with the patient and are negative.  PHYSICAL EXAMINATION: ECOG PERFORMANCE STATUS: 3 - Symptomatic, >50% confined to bed  Blood pressure 144/78, pulse 77, temperature 97.6 F (36.4 C), temperature source Oral, resp. rate 18, height 5\' 3"  (1.6 m), weight 177 lb 14.4 oz (80.695 kg).  General appearance: alert, appears stated age, no distress and moderately obese  Head: Normocephalic, without obvious abnormality, atraumatic  Neck: no adenopathy, supple, symmetrical, trachea midline and thyroid not enlarged, symmetric, no tenderness/mass/nodules  Lymph nodes: Cervical adenopathy: None appreciated  Heart:regular rate and rhythm, S1, S2 normal and systolic murmur: systolic ejection 2/6, harsh and previously documented without radiation  Lung:chest clear, no wheezing, rales, normal symmetric air entry, Heart exam - S1, S2 normal, no murmur, no gallop, rate regular  Abdomin: soft, distended, normal bowel sounds, nontender and incisional hernia and easily reducible and nontender  EXT:No peripheral edema on right extremity; Left above the knee amputation. She is wearing a prothesis.   Labs:  Lab Results  Component Value Date   WBC 4.4 10/01/2013   HGB 7.6* 10/01/2013   HCT 24.2* 10/01/2013   MCV 89.3 10/01/2013   PLT 333 10/01/2013   NEUTROABS 2.7 10/01/2013      Chemistry      Component Value Date/Time   NA 138 10/01/2013 0938   NA 128* 04/09/2012 2333   K 4.0 10/01/2013 0938   K 3.8 04/09/2012 2333   CL 104 10/03/2012 1048   CL 92* 04/09/2012 2333   CO2 24 10/01/2013 0938   CO2 27 04/09/2012 2333   BUN 14.1 10/01/2013 0938   BUN 7 04/09/2012 2333   CREATININE 0.8 10/01/2013 0938   CREATININE 0.61 04/09/2012 2333      Component Value Date/Time   CALCIUM 8.4 10/01/2013 0938   CALCIUM 8.0* 04/10/2012 0410   ALKPHOS 97 10/01/2013 0938   ALKPHOS 57  04/09/2012 1615   AST 94* 10/01/2013 0938   AST 45* 04/09/2012 1615   ALT 64* 10/01/2013 0938   ALT 40* 04/09/2012 1615   BILITOT 0.21 10/01/2013 0938   BILITOT 0.3 04/09/2012 1615       Basic Metabolic Panel:  Recent Labs Lab 10/01/13 0938  NA 138  K 4.0  CO2 24  GLUCOSE 282*  BUN 14.1  CREATININE 0.8  CALCIUM 8.4   GFR Estimated Creatinine Clearance: 75.2 ml/min (by C-G formula based on Cr of 0.8). Liver Function Tests:  Recent Labs Lab 10/01/13 0938  AST 94*  ALT 64*  ALKPHOS 97  BILITOT 0.21  PROT 6.0*  ALBUMIN 3.1*    CBC:  Recent Labs Lab 10/01/13 0938  WBC 4.4  NEUTROABS 2.7  HGB 7.6*  HCT 24.2*  MCV 89.3  PLT 333    Anemia work up  Recent Labs  10/01/13 0938  FERRITIN 62  TIBC 428  IRON 20*    Studies:  No results found.   RADIOGRAPHIC STUDIES: Mm Digital Screening Bilateral  07/31/2013   CLINICAL DATA:  Screening.  EXAM: DIGITAL SCREENING BILATERAL MAMMOGRAM WITH CAD  COMPARISON:  Previous exam(s).  ACR Breast Density Category c: The breast tissue is heterogeneously dense, which may obscure small masses.  FINDINGS: There are no findings suspicious for malignancy. Images were processed with CAD.  IMPRESSION: No mammographic evidence of malignancy. A result letter of this screening mammogram will be mailed directly to the patient.  RECOMMENDATION: Screening mammogram in one year. (Code:SM-B-01Y)  BI-RADS CATEGORY  1: Negative.   Electronically Signed   By: Abelardo Diesel M.D.   On: 07/31/2013 09:01    ASSESSMENT: NGA RABON 63 y.o. female with a history of Iron deficiency anemia secondary to blood loss (chronic) - Plan: methylPREDNISolone sodium succinate (SOLU-MEDROL) 125 mg/2 mL injection 80 mg, CBC with Differential in 1 month, CBC with Differential in 2 months, Comprehensive metabolic panel, Ferritin, Ferritin, Iron and TIBC, Iron and TIBC   PLAN:  1. Iron deficiency anemia secondary to GI lost.  --She reports worsening fatigue and  her hemoglobin (7.6)  is low today.  We will continue to support this patient as best we can with IV iron.She will obtain IV iron today.  We are continuing to check a CBC and ferritin every month. We will plan to see [name] again in 2 months, at which time we will check CBC, chemistries, and iron studies.   2. Elevated transiminases in the setting of an elevated ferritin likely due to secondary hemochromatosis and chronic hepatitis C.  --We will continue to follow LFTs to establish trend. We prefer to use annual liver MRI and initiate chelation if the liver iron is >3 mg/g dry weight or if the serum ferritin is >1000 ng/mL on two consecutive measurements. Her last MRI of abdomen (11/2011) demonstrated findings consistent with secondary or transfusional hemochromatosis involving the liver, spleen, adrenal glands and bone marrow. No findings for cirrhosis, portal hypertension, splenomegaly or ascites. No worrisome enhancing liver lesions.  Patient was counseled to avoid hepatatoxins including percocet which has tylenol. We provided her with oxycodone 5 mg instead.   3. Large incisional hernia. --Being watched by surgery.  Surgery delayed due to newly diagnosed diabetes.   4. DM2. --Management per PCP's office.   All questions were answered. The patient knows to call the clinic with any problems, questions or concerns. We can certainly see the patient much sooner if necessary.  I spent 15 minutes counseling the patient face to face. The total time spent in the appointment was 25 minutes.    Concha Norway, MD 10/01/2013 2:20 PM

## 2013-10-01 NOTE — Progress Notes (Signed)
Pt observed x 30 minutes.  Pt tolerated well, No complaints.  SLJ

## 2013-10-07 ENCOUNTER — Telehealth: Payer: Self-pay | Admitting: Internal Medicine

## 2013-10-07 NOTE — Telephone Encounter (Signed)
added lb/fu for 7/6. pt already has lab 6/4. not able to reach pt by phone or lm. schedule mailed.

## 2013-10-16 ENCOUNTER — Ambulatory Visit (INDEPENDENT_AMBULATORY_CARE_PROVIDER_SITE_OTHER): Payer: Medicare Other | Admitting: Internal Medicine

## 2013-10-16 ENCOUNTER — Encounter: Payer: Medicare Other | Admitting: Dietician

## 2013-10-16 VITALS — BP 138/84 | HR 74 | Temp 99.1°F | Ht 63.0 in | Wt 177.0 lb

## 2013-10-16 DIAGNOSIS — E1165 Type 2 diabetes mellitus with hyperglycemia: Secondary | ICD-10-CM | POA: Diagnosis not present

## 2013-10-16 DIAGNOSIS — F3289 Other specified depressive episodes: Secondary | ICD-10-CM | POA: Diagnosis not present

## 2013-10-16 DIAGNOSIS — S99919A Unspecified injury of unspecified ankle, initial encounter: Secondary | ICD-10-CM | POA: Diagnosis not present

## 2013-10-16 DIAGNOSIS — E118 Type 2 diabetes mellitus with unspecified complications: Principal | ICD-10-CM

## 2013-10-16 DIAGNOSIS — E119 Type 2 diabetes mellitus without complications: Secondary | ICD-10-CM | POA: Diagnosis not present

## 2013-10-16 DIAGNOSIS — T8189XA Other complications of procedures, not elsewhere classified, initial encounter: Secondary | ICD-10-CM

## 2013-10-16 DIAGNOSIS — S8990XA Unspecified injury of unspecified lower leg, initial encounter: Secondary | ICD-10-CM | POA: Diagnosis not present

## 2013-10-16 DIAGNOSIS — F329 Major depressive disorder, single episode, unspecified: Secondary | ICD-10-CM | POA: Diagnosis not present

## 2013-10-16 DIAGNOSIS — IMO0002 Reserved for concepts with insufficient information to code with codable children: Secondary | ICD-10-CM

## 2013-10-16 LAB — GLUCOSE, CAPILLARY: Glucose-Capillary: 161 mg/dL — ABNORMAL HIGH (ref 70–99)

## 2013-10-16 MED ORDER — BOOST PLUS PO LIQD: 237.0000 mL | Freq: Three times a day (TID) | ORAL | Status: DC

## 2013-10-16 MED ORDER — OXYCODONE-ACETAMINOPHEN 10-325 MG PO TABS: 1.0000 | ORAL_TABLET | Freq: Four times a day (QID) | ORAL | Status: DC | PRN

## 2013-10-16 NOTE — Progress Notes (Signed)
Subjective:    Patient ID: [name], female    DOB: 1951-02-08, 63 y.o.   MRN: [id number]  HPI [name] is a 63yo woman pmh as listed below presents for diabetes management.   The patient was recently seen by Dr. Lorelle Formosa her primary hematologist for her iron deficiency anemia thought to be secondary to chronic AVMs and GI losses. Patient's hemoglobin at that time was 7.6 which is lower than her normal and she was having feelings of fatigue. She was transfused IV iron at that time and given Solu-Medrol. The patient has not noticed any significant side effects and has been tolerating IV iron infusions since her procedure. She states that her energy level is still a little low but better at this point.  In terms of her diabetes she has been very reluctant and apprehensive to start treatment but during our last visit on 09/18/13 she began taking metformin 500 mg for 1 week and then was up titrated to twice a day dosing and is here for a recheck.the patient has also been attending nutrition consults and working with a diabetes educator in terms of trying to maintain sustainable lifestyle modifications. She feels that these are going well at this time. She was given a meter and does not bring her meter today. She does state that she is only checking her CBG readings about once a day before eating breakfast therefore her preprandial something in the 160s to 170s. She did receive some stress dose steroids during her iron infusions and did not realize any increase it during that time.  Past Medical History  Diagnosis Date  . Hypertension   . Colon polyps   . Stomach problems   . Hypokalemic alkalosis   . Allergic rhinitis   . GERD (gastroesophageal reflux disease)   . Stroke 2010    no residual problems  . Anemia, iron deficiency 05/03/2011    recieves iron infusions  . Anemia   . GI bleeding   . Diverticulitis   . Transfusion history   . Difficulty sleeping     takes trazadone for  sleep  . Peripheral vascular disease   . Hepatitis     hep C  . Anxiety   . Shortness of breath     due to anemia   Current Outpatient Prescriptions on File Prior to Visit  Medication Sig Dispense Refill  . ACCU-CHEK FASTCLIX LANCETS MISC Check blood sugar 2 times a day or 14 times in a week  102 each  5  . aspirin EC 81 MG tablet Take 2 tablets (162 mg total) by mouth 2 (two) times daily.  31 tablet  1  . atenolol (TENORMIN) 50 MG tablet Take 50 mg by mouth daily.      . Blood Glucose Monitoring Suppl (ACCU-CHEK NANO SMARTVIEW) W/DEVICE KIT 1 Device by Does not apply route once.  1 kit  0  . cetirizine (ZYRTEC) 10 MG tablet Take 10 mg by mouth daily.      Marland Kitchen esomeprazole (NEXIUM) 40 MG capsule Take 1 capsule (40 mg total) by mouth 2 (two) times daily.  30 capsule  12  . FLUoxetine (PROZAC) 20 MG tablet Take 2 tablets (40 mg total) by mouth daily.  60 tablet  3  . gabapentin (NEURONTIN) 400 MG capsule TAKE 2 CAPSULE BY MOUTH THREE TIMES A DAY  180 capsule  2  . glucose blood (ACCU-CHEK SMARTVIEW) test strip Check blood sugar 2 times each day or 14 times each  week Diagnosis code 250.00  100 each  5  . hydrochlorothiazide (HYDRODIURIL) 25 MG tablet TAKE 1 TABLET BY MOUTH DAILY  30 tablet  12  . lisinopril (PRINIVIL,ZESTRIL) 40 MG tablet Take 40 mg by mouth daily.      . metFORMIN (GLUCOPHAGE) 500 MG tablet Take 1 tablet (500 mg total) by mouth daily with breakfast.  60 tablet  11  . oxyCODONE-acetaminophen (PERCOCET) 10-325 MG per tablet Take 1 tablet by mouth every 6 (six) hours as needed for pain.  120 tablet  0  . potassium chloride 20 MEQ/15ML (10%) solution Take 20 mEq by mouth daily.      Marland Kitchen Specialty Vitamins Products (MAGNESIUM, AMINO ACID CHELATE,) 133 MG tablet Take 1 tablet by mouth 2 (two) times daily.      . traMADol (ULTRAM) 50 MG tablet TAKE 2 TABLETS BY MOUTH EVERY 12 HOURS AS NEEDED FOR PAIN  40 tablet  0  . traZODone (DESYREL) 100 MG tablet Take 100 mg by mouth at bedtime.       . vitamin C (ASCORBIC ACID) 500 MG tablet Take 1 tablet (500 mg total) by mouth daily.  30 tablet  12   No current facility-administered medications on file prior to visit.   Social, surgical, family history reviewed with patient and updated in appropriate chart locations.   Review of Systems  Constitutional: Positive for fatigue. Negative for fever, diaphoresis, activity change, appetite change and unexpected weight change.  Respiratory: Negative for cough, chest tightness and shortness of breath.   Cardiovascular: Negative for chest pain, palpitations and leg swelling.  Gastrointestinal: Positive for abdominal distention (baseline). Negative for blood in stool.  Endocrine: Negative for cold intolerance, heat intolerance, polydipsia and polyuria.  Genitourinary: Negative for hematuria and vaginal bleeding.      Objective:   Physical Exam Filed Vitals:   10/16/13 1321  BP: 138/84  Pulse: 74  Temp: 99.1 F (37.3 C)   General: sitting in wheelchair, NAD  HEENT: PERRL, EOMI, no scleral icterus Cardiac: RRR, no rubs, murmurs or gallops Pulm: clear to auscultation bilaterally, moving normal volumes of air Abd: soft, nontender, nondistended, BS present, periumbilical hernia still easily reducible and nttp, no open wounds, granulation tissue over midabdominal incision Ext: warm and well perfused, no pedal edema Neuro: alert and oriented X3, cranial nerves II-XII grossly intact    Assessment & Plan:  Please see problem oriented charting  Pt discussed with Dr. Stann Mainland

## 2013-10-16 NOTE — Assessment & Plan Note (Signed)
>>  ASSESSMENT AND PLAN FOR TYPE 2 DIABETES MELLITUS WITH DIABETIC NEUROPATHY ([facility]) WRITTEN ON 10/16/2013  2:15 PM BY MONTY BAIL, MD  Patient does not bring in her meter today also by her description her preprandial during or in the 160s to 170 and she has successfully up titrated her metformin  to 500 mg twice a day. She is resistant and frankly refusing to increase this dose at this time I would like to continue with her current dose and lifestyle modification. -Continue diabetes education visits, -Patient refused microalbumin and lipid panel testing today therefore will try again at next followup -followup in 2 months

## 2013-10-16 NOTE — Assessment & Plan Note (Signed)
Patient does not bring in her meter today also by her description her preprandial during or in the 160s to 170 and she has successfully up titrated her metformin to 500 mg twice a day. She is resistant and frankly refusing to increase this dose at this time I would like to continue with her current dose and lifestyle modification. -Continue diabetes education visits, -Patient refused microalbumin and lipid panel testing today therefore will try again at next followup -followup in 2 months

## 2013-10-16 NOTE — Patient Instructions (Signed)
Please bring your medicines with you each time you come.   Medicines may be  Eye drops  Herbal   Vitamins  Pills  Seeing these help [place] take care of you.   Good to see you doing so well. Keep up the hard work and keep taking your pills.

## 2013-10-20 NOTE — Progress Notes (Signed)
Case discussed with Dr. Sadek soon after the resident saw the patient.  We reviewed the resident's history and exam and pertinent patient test results.  I agree with the assessment, diagnosis, and plan of care documented in the resident's note. 

## 2013-10-21 ENCOUNTER — Other Ambulatory Visit (INDEPENDENT_AMBULATORY_CARE_PROVIDER_SITE_OTHER): Payer: Self-pay | Admitting: Surgery

## 2013-10-21 ENCOUNTER — Telehealth (INDEPENDENT_AMBULATORY_CARE_PROVIDER_SITE_OTHER): Payer: Self-pay | Admitting: General Surgery

## 2013-10-21 NOTE — Telephone Encounter (Signed)
Can this patient have this Rx 

## 2013-10-21 NOTE — Telephone Encounter (Signed)
Mailed patient Rx for Ultram 50 mg 2 tabs by month every 12 as needed for pain. I had Dr Rockwell Alexandria to sign the Rx

## 2013-10-23 NOTE — Addendum Note (Signed)
Addended by: Orson Gear on: 10/23/2013 02:25 PM   Modules accepted: Orders

## 2013-10-29 ENCOUNTER — Other Ambulatory Visit: Payer: Self-pay | Admitting: Internal Medicine

## 2013-10-29 ENCOUNTER — Other Ambulatory Visit (HOSPITAL_BASED_OUTPATIENT_CLINIC_OR_DEPARTMENT_OTHER): Payer: Medicare Other

## 2013-10-29 DIAGNOSIS — R7402 Elevation of levels of lactic acid dehydrogenase (LDH): Secondary | ICD-10-CM | POA: Diagnosis not present

## 2013-10-29 DIAGNOSIS — D500 Iron deficiency anemia secondary to blood loss (chronic): Secondary | ICD-10-CM

## 2013-10-29 DIAGNOSIS — R7401 Elevation of levels of liver transaminase levels: Secondary | ICD-10-CM

## 2013-10-29 DIAGNOSIS — R740 Nonspecific elevation of levels of transaminase and lactic acid dehydrogenase [LDH]: Secondary | ICD-10-CM

## 2013-10-29 LAB — CBC WITH DIFFERENTIAL/PLATELET
BASO%: 0.6 % (ref 0.0–2.0)
Basophils Absolute: 0 10*3/uL (ref 0.0–0.1)
EOS%: 0.8 % (ref 0.0–7.0)
Eosinophils Absolute: 0 10*3/uL (ref 0.0–0.5)
HCT: 34.6 % — ABNORMAL LOW (ref 34.8–46.6)
HGB: 11.3 g/dL — ABNORMAL LOW (ref 11.6–15.9)
LYMPH#: 0.9 10*3/uL (ref 0.9–3.3)
LYMPH%: 16.9 % (ref 14.0–49.7)
MCH: 30.9 pg (ref 25.1–34.0)
MCHC: 32.6 g/dL (ref 31.5–36.0)
MCV: 94.9 fL (ref 79.5–101.0)
MONO#: 0.7 10*3/uL (ref 0.1–0.9)
MONO%: 13.2 % (ref 0.0–14.0)
NEUT#: 3.8 10*3/uL (ref 1.5–6.5)
NEUT%: 68.5 % (ref 38.4–76.8)
Platelets: 262 10*3/uL (ref 145–400)
RBC: 3.65 10*6/uL — ABNORMAL LOW (ref 3.70–5.45)
RDW: 19.4 % — AB (ref 11.2–14.5)
WBC: 5.5 10*3/uL (ref 3.9–10.3)

## 2013-10-29 LAB — IRON AND TIBC CHCC
%SAT: 13 % — AB (ref 21–57)
IRON: 53 ug/dL (ref 41–142)
TIBC: 406 ug/dL (ref 236–444)
UIBC: 354 ug/dL (ref 120–384)

## 2013-10-29 LAB — FERRITIN CHCC: FERRITIN: 150 ng/mL (ref 9–269)

## 2013-11-04 ENCOUNTER — Other Ambulatory Visit: Payer: Self-pay | Admitting: Internal Medicine

## 2013-11-10 ENCOUNTER — Telehealth: Payer: Self-pay | Admitting: Internal Medicine

## 2013-11-10 ENCOUNTER — Other Ambulatory Visit: Payer: Self-pay | Admitting: Internal Medicine

## 2013-11-10 ENCOUNTER — Telehealth: Payer: Self-pay | Admitting: Medical Oncology

## 2013-11-10 ENCOUNTER — Telehealth: Payer: Self-pay | Admitting: *Deleted

## 2013-11-10 NOTE — Telephone Encounter (Signed)
Per staff message and POF I have scheduled appts. Advised scheduler of appts. JMW  

## 2013-11-10 NOTE — Telephone Encounter (Signed)
, °

## 2013-11-10 NOTE — Telephone Encounter (Signed)
Pt called and left a message that she is feeling very tired and thinks she needs feraheme. I spoke with Dr. Juliann Mule and he will place orders and a POF. I called pt and left her a message to expect a cal from scheduling for infusion this week.

## 2013-11-12 ENCOUNTER — Other Ambulatory Visit: Payer: Self-pay | Admitting: Internal Medicine

## 2013-11-12 ENCOUNTER — Ambulatory Visit (HOSPITAL_BASED_OUTPATIENT_CLINIC_OR_DEPARTMENT_OTHER): Payer: Medicare Other

## 2013-11-12 ENCOUNTER — Other Ambulatory Visit: Payer: Self-pay | Admitting: *Deleted

## 2013-11-12 DIAGNOSIS — K922 Gastrointestinal hemorrhage, unspecified: Secondary | ICD-10-CM

## 2013-11-12 DIAGNOSIS — D500 Iron deficiency anemia secondary to blood loss (chronic): Secondary | ICD-10-CM

## 2013-11-12 MED ORDER — METHYLPREDNISOLONE SODIUM SUCC 125 MG IJ SOLR
INTRAMUSCULAR | Status: AC
  Filled 2013-11-12: qty 2

## 2013-11-12 MED ORDER — METHYLPREDNISOLONE SODIUM SUCC 125 MG IJ SOLR
80.0000 mg | Freq: Once | INTRAMUSCULAR | Status: AC
Start: 2013-11-12 — End: 2013-11-12
  Administered 2013-11-12: 80 mg via INTRAVENOUS

## 2013-11-12 MED ORDER — DIPHENHYDRAMINE HCL 25 MG PO CAPS
ORAL_CAPSULE | ORAL | Status: AC
  Filled 2013-11-12: qty 1

## 2013-11-12 MED ORDER — DIPHENHYDRAMINE HCL 25 MG PO CAPS
25.0000 mg | ORAL_CAPSULE | Freq: Once | ORAL | Status: AC
Start: 2013-11-12 — End: 2013-11-12
  Administered 2013-11-12: 25 mg via ORAL

## 2013-11-12 MED ORDER — SODIUM CHLORIDE 0.9 % IV SOLN
510.0000 mg | Freq: Once | INTRAVENOUS | Status: AC
  Administered 2013-11-12: 510 mg via INTRAVENOUS
  Filled 2013-11-12: qty 17

## 2013-11-12 MED ORDER — SODIUM CHLORIDE 0.9 % IV SOLN
Freq: Once | INTRAVENOUS | Status: AC
  Administered 2013-11-12: 09:00:00 via INTRAVENOUS

## 2013-11-12 NOTE — Patient Instructions (Signed)
Ferumoxytol injection What is this medicine? FERUMOXYTOL is an iron complex. Iron is used to make healthy red blood cells, which carry oxygen and nutrients throughout the body. This medicine is used to treat iron deficiency anemia in people with chronic kidney disease. This medicine may be used for other purposes; ask your health care Kaelum Kissick or pharmacist if you have questions. COMMON BRAND NAME(S): Feraheme What should I tell my health care Zettie Gootee before I take this medicine? They need to know if you have any of these conditions: -anemia not caused by low iron levels -high levels of iron in the blood -magnetic resonance imaging (MRI) test scheduled -an unusual or allergic reaction to iron, other medicines, foods, dyes, or preservatives -pregnant or trying to get pregnant -breast-feeding How should I use this medicine? This medicine is for injection into a vein. It is given by a health care professional in a hospital or clinic setting. Talk to your pediatrician regarding the use of this medicine in children. Special care may be needed. Overdosage: If you think you've taken too much of this medicine contact a poison control center or emergency room at once. Overdosage: If you think you have taken too much of this medicine contact a poison control center or emergency room at once. NOTE: This medicine is only for you. Do not share this medicine with others. What if I miss a dose? It is important not to miss your dose. Call your doctor or health care professional if you are unable to keep an appointment. What may interact with this medicine? This medicine may interact with the following medications: -other iron products This list may not describe all possible interactions. Give your health care Sterling Ucci a list of all the medicines, herbs, non-prescription drugs, or dietary supplements you use. Also tell them if you smoke, drink alcohol, or use illegal drugs. Some items may interact with your  medicine. What should I watch for while using this medicine? Visit your doctor or healthcare professional regularly. Tell your doctor or healthcare professional if your symptoms do not start to get better or if they get worse. You may need blood work done while you are taking this medicine. You may need to follow a special diet. Talk to your doctor. Foods that contain iron include: whole grains/cereals, dried fruits, beans, or peas, leafy green vegetables, and organ meats (liver, kidney). What side effects may I notice from receiving this medicine? Side effects that you should report to your doctor or health care professional as soon as possible: -allergic reactions like skin rash, itching or hives, swelling of the face, lips, or tongue -breathing problems -changes in blood pressure -feeling faint or lightheaded, falls -fever or chills -flushing, sweating, or hot feelings -swelling of the ankles or feet Side effects that usually do not require medical attention (Report these to your doctor or health care professional if they continue or are bothersome.): -diarrhea -headache -nausea, vomiting -stomach pain This list may not describe all possible side effects. Call your doctor for medical advice about side effects. You may report side effects to FDA at 1-800-FDA-1088. Where should I keep my medicine? This drug is given in a hospital or clinic and will not be stored at home. NOTE: This sheet is a summary. It may not cover all possible information. If you have questions about this medicine, talk to your doctor, pharmacist, or health care Bellamia Ferch.  2015, Elsevier/Gold Standard. (2011-12-28 15:23:36)  

## 2013-11-17 ENCOUNTER — Other Ambulatory Visit: Payer: Self-pay | Admitting: Internal Medicine

## 2013-11-17 ENCOUNTER — Other Ambulatory Visit (INDEPENDENT_AMBULATORY_CARE_PROVIDER_SITE_OTHER): Payer: Self-pay | Admitting: Surgery

## 2013-11-18 ENCOUNTER — Encounter (HOSPITAL_COMMUNITY): Payer: Self-pay | Admitting: Emergency Medicine

## 2013-11-18 ENCOUNTER — Emergency Department (HOSPITAL_COMMUNITY): Payer: Medicare Other

## 2013-11-18 ENCOUNTER — Emergency Department (HOSPITAL_COMMUNITY)
Admission: EM | Admit: 2013-11-18 | Discharge: 2013-11-18 | Disposition: A | Discharge status: Home / Self Care | Payer: Medicare Other | Attending: Emergency Medicine | Admitting: Emergency Medicine

## 2013-11-18 DIAGNOSIS — R6889 Other general symptoms and signs: Secondary | ICD-10-CM | POA: Diagnosis not present

## 2013-11-18 DIAGNOSIS — K219 Gastro-esophageal reflux disease without esophagitis: Secondary | ICD-10-CM | POA: Diagnosis not present

## 2013-11-18 DIAGNOSIS — M545 Low back pain, unspecified: Secondary | ICD-10-CM | POA: Diagnosis not present

## 2013-11-18 DIAGNOSIS — T07XXXA Unspecified multiple injuries, initial encounter: Secondary | ICD-10-CM | POA: Diagnosis not present

## 2013-11-18 DIAGNOSIS — Y92410 Unspecified street and highway as the place of occurrence of the external cause: Secondary | ICD-10-CM | POA: Diagnosis not present

## 2013-11-18 DIAGNOSIS — Z8673 Personal history of transient ischemic attack (TIA), and cerebral infarction without residual deficits: Secondary | ICD-10-CM | POA: Insufficient documentation

## 2013-11-18 DIAGNOSIS — Y831 Surgical operation with implant of artificial internal device as the cause of abnormal reaction of the patient, or of later complication, without mention of misadventure at the time of the procedure: Secondary | ICD-10-CM | POA: Diagnosis not present

## 2013-11-18 DIAGNOSIS — D509 Iron deficiency anemia, unspecified: Secondary | ICD-10-CM | POA: Insufficient documentation

## 2013-11-18 DIAGNOSIS — Z860100 Personal history of colon polyps, unspecified: Secondary | ICD-10-CM | POA: Insufficient documentation

## 2013-11-18 DIAGNOSIS — S78119A Complete traumatic amputation at level between unspecified hip and knee, initial encounter: Secondary | ICD-10-CM | POA: Diagnosis not present

## 2013-11-18 DIAGNOSIS — Y9389 Activity, other specified: Secondary | ICD-10-CM | POA: Diagnosis not present

## 2013-11-18 DIAGNOSIS — Z880 Allergy status to penicillin: Secondary | ICD-10-CM | POA: Diagnosis not present

## 2013-11-18 DIAGNOSIS — Z8619 Personal history of other infectious and parasitic diseases: Secondary | ICD-10-CM | POA: Diagnosis not present

## 2013-11-18 DIAGNOSIS — F17200 Nicotine dependence, unspecified, uncomplicated: Secondary | ICD-10-CM | POA: Insufficient documentation

## 2013-11-18 DIAGNOSIS — M546 Pain in thoracic spine: Secondary | ICD-10-CM | POA: Diagnosis not present

## 2013-11-18 DIAGNOSIS — Z79899 Other long term (current) drug therapy: Secondary | ICD-10-CM | POA: Diagnosis not present

## 2013-11-18 DIAGNOSIS — T84099A Other mechanical complication of unspecified internal joint prosthesis, initial encounter: Secondary | ICD-10-CM | POA: Insufficient documentation

## 2013-11-18 DIAGNOSIS — S79919A Unspecified injury of unspecified hip, initial encounter: Secondary | ICD-10-CM | POA: Diagnosis not present

## 2013-11-18 DIAGNOSIS — F411 Generalized anxiety disorder: Secondary | ICD-10-CM | POA: Diagnosis not present

## 2013-11-18 DIAGNOSIS — I959 Hypotension, unspecified: Secondary | ICD-10-CM | POA: Diagnosis not present

## 2013-11-18 DIAGNOSIS — T148XXA Other injury of unspecified body region, initial encounter: Secondary | ICD-10-CM

## 2013-11-18 DIAGNOSIS — Z7982 Long term (current) use of aspirin: Secondary | ICD-10-CM | POA: Diagnosis not present

## 2013-11-18 DIAGNOSIS — IMO0002 Reserved for concepts with insufficient information to code with codable children: Secondary | ICD-10-CM | POA: Diagnosis not present

## 2013-11-18 DIAGNOSIS — G478 Other sleep disorders: Secondary | ICD-10-CM | POA: Insufficient documentation

## 2013-11-18 DIAGNOSIS — I10 Essential (primary) hypertension: Secondary | ICD-10-CM | POA: Diagnosis not present

## 2013-11-18 DIAGNOSIS — M25559 Pain in unspecified hip: Secondary | ICD-10-CM | POA: Diagnosis not present

## 2013-11-18 DIAGNOSIS — W19XXXA Unspecified fall, initial encounter: Secondary | ICD-10-CM

## 2013-11-18 LAB — CBG MONITORING, ED: Glucose-Capillary: 219 mg/dL — ABNORMAL HIGH (ref 70–99)

## 2013-11-18 MED ORDER — CYCLOBENZAPRINE HCL 10 MG PO TABS
10.0000 mg | ORAL_TABLET | Freq: Once | ORAL | Status: AC
  Administered 2013-11-18: 10 mg via ORAL
  Filled 2013-11-18: qty 1

## 2013-11-18 MED ORDER — CYCLOBENZAPRINE HCL 10 MG PO TABS: 10.0000 mg | ORAL_TABLET | Freq: Two times a day (BID) | ORAL | Status: DC | PRN

## 2013-11-18 MED ORDER — OXYCODONE-ACETAMINOPHEN 10-325 MG PO TABS: 1.0000 | ORAL_TABLET | ORAL | Status: DC | PRN

## 2013-11-18 MED ORDER — HYDROMORPHONE HCL PF 1 MG/ML IJ SOLN
1.0000 mg | Freq: Once | INTRAMUSCULAR | Status: AC
Start: 2013-11-18 — End: 2013-11-18
  Administered 2013-11-18: 1 mg via INTRAVENOUS
  Filled 2013-11-18: qty 1

## 2013-11-18 MED ORDER — ONDANSETRON HCL 4 MG/2ML IJ SOLN
4.0000 mg | Freq: Once | INTRAMUSCULAR | Status: AC
  Administered 2013-11-18: 4 mg via INTRAVENOUS
  Filled 2013-11-18: qty 2

## 2013-11-18 NOTE — ED Provider Notes (Addendum)
CSN: [id number]     Arrival date & time 11/18/13  1426 History   First MD Initiated Contact with Patient 11/18/13 1501     Chief Complaint  Patient presents with  . Fall     (Consider location/radiation/quality/duration/timing/severity/associated sxs/prior Treatment) Patient is a 63 y.o. female presenting with fall. The history is provided by the patient.  Fall This is a new (getting out of a truck and her prosthetic leg got tangled and she fell toward the left side onto the asphalt) problem. The current episode started 1 to 2 hours ago. The problem occurs constantly. The problem has not changed since onset.Pertinent negatives include no chest pain, no abdominal pain and no shortness of breath. Associated symptoms comments: Left hip, thoracic and lumbar pain.  No head injury or LOC.  No syncope.. The symptoms are aggravated by standing. Nothing relieves the symptoms. She has tried nothing for the symptoms. The treatment provided no relief.    Past Medical History  Diagnosis Date  . Hypertension   . Colon polyps   . Stomach problems   . Hypokalemic alkalosis   . Allergic rhinitis   . GERD (gastroesophageal reflux disease)   . Stroke 2010    no residual problems  . Anemia, iron deficiency 05/03/2011    recieves iron infusions  . Anemia   . GI bleeding   . Diverticulitis   . Transfusion history   . Difficulty sleeping     takes trazadone for sleep  . Peripheral vascular disease   . Hepatitis     hep C  . Anxiety   . Shortness of breath     due to anemia   Past Surgical History  Procedure Laterality Date  . Leg amputation above knee    . Esophagogastroduodenoscopy  05/05/2011    Procedure: ESOPHAGOGASTRODUODENOSCOPY (EGD);  Surgeon: Zenovia Jarred, MD;  Location: [facility];  Service: Gastroenterology;  Laterality: N/A;  Dr. Hilarie Fredrickson will do procedure for Dr. Benson Norway Saturday.  . Esophagogastroduodenoscopy  05/08/2011    Procedure: ESOPHAGOGASTRODUODENOSCOPY (EGD);  Surgeon: Beryle Beams;  Location: [facility];  Service: Endoscopy;  Laterality: N/A;  . Esophagogastroduodenoscopy  06/07/2011    Procedure: ESOPHAGOGASTRODUODENOSCOPY (EGD);  Surgeon: Beryle Beams, MD;  Location: [facility];  Service: Endoscopy;  Laterality: N/A;  . Hot hemostasis  06/07/2011    Procedure: HOT HEMOSTASIS (ARGON PLASMA COAGULATION/BICAP);  Surgeon: Beryle Beams, MD;  Location: [facility];  Service: Endoscopy;  Laterality: N/A;  . Esophagogastroduodenoscopy  12/20/2011    Procedure: ESOPHAGOGASTRODUODENOSCOPY (EGD);  Surgeon: Beryle Beams, MD;  Location: [facility];  Service: Endoscopy;  Laterality: N/A;  . Flexible sigmoidoscopy  12/21/2011    Procedure: FLEXIBLE SIGMOIDOSCOPY;  Surgeon: Beryle Beams, MD;  Location: [facility];  Service: Endoscopy;  Laterality: N/A;  . Carpal tunnel release      rt hand  . Partial colectomy  02/15/2012    Procedure: PARTIAL COLECTOMY;  Surgeon: Harl Bowie, MD;  Location: [facility];  Service: General;  Laterality: N/A;  . Laparotomy  02/16/2012    Procedure: EXPLORATORY LAPAROTOMY;  Surgeon: Edward Jolly, MD;  Location: [facility];  Service: General;  Laterality: N/A;  oversewing of anastomotic leak and rigid sigmoidoscopy  . Enteroscopy N/A 12/04/2012    Procedure: ENTEROSCOPY;  Surgeon: Beryle Beams, MD;  Location: [facility];  Service: Endoscopy;  Laterality: N/A;  . Enteroscopy N/A 12/18/2012    Procedure: ENTEROSCOPY;  Surgeon: Beryle Beams, MD;  Location: [facility];  Service: Endoscopy;  Laterality: N/A;  . Eye surgery Bilateral     cataracts   Family History  Problem Relation Age of Onset  . Cancer Father     unknown  . Hypertension Mother   . Diverticulitis Mother    History  Substance Use Topics  . Smoking status: Current Every Day Smoker -- 0.20 packs/day for 17 years    Types: Cigarettes  . Smokeless tobacco: Never Used     Comment: wants to quit  . Alcohol Use: Yes     Comment: Occasionally   OB History    Grav Para Term Preterm Abortions TAB SAB Ect Mult Living                 Review of Systems  Respiratory: Negative for shortness of breath.   Cardiovascular: Negative for chest pain.  Gastrointestinal: Negative for abdominal pain.  All other systems reviewed and are negative.     Allergies  Ciprofloxacin; Morphine and related; Penicillins; Codeine; and Feraheme  Home Medications   Prior to Admission medications   Medication Sig Start Date End Date Taking? Authorizing Provider  aspirin EC 81 MG tablet Take 162 mg by mouth 2 (two) times daily.   Yes Historical Provider, MD  atenolol (TENORMIN) 50 MG tablet Take 50 mg by mouth every morning.    Yes Historical Provider, MD  cetirizine (ZYRTEC) 10 MG tablet Take 10 mg by mouth every morning.    Yes Historical Provider, MD  esomeprazole (NEXIUM) 40 MG capsule Take 40 mg by mouth 2 (two) times daily.   Yes Historical Provider, MD  fluorometholone (FML) 0.1 % ophthalmic suspension Place 1 drop into the left eye 2 (two) times daily.   Yes Historical Provider, MD  FLUoxetine (PROZAC) 20 MG tablet Take 40 mg by mouth every morning.    Yes Historical Provider, MD  gabapentin (NEURONTIN) 400 MG capsule Take 800 mg by mouth 3 (three) times daily.   Yes Historical Provider, MD  hydrochlorothiazide (HYDRODIURIL) 25 MG tablet Take 25 mg by mouth every morning.   Yes Historical Provider, MD  lisinopril (PRINIVIL,ZESTRIL) 40 MG tablet Take 40 mg by mouth every morning.    Yes Historical Provider, MD  metFORMIN (GLUCOPHAGE) 500 MG tablet Take 500 mg by mouth daily with breakfast.   Yes Historical Provider, MD  oxyCODONE-acetaminophen (PERCOCET) 10-325 MG per tablet Take 1 tablet by mouth every 6 (six) hours as needed for pain.   Yes Historical Provider, MD  potassium chloride 20 MEQ/15ML (10%) solution Take 20 mEq by mouth every morning.    Yes Historical Provider, MD  prednisoLONE acetate (PRED FORTE) 1 % ophthalmic suspension Place 1 drop into the left  eye 3 (three) times daily.   Yes Historical Provider, MD  Specialty Vitamins Products (MAGNESIUM, AMINO ACID CHELATE,) 133 MG tablet Take 1 tablet by mouth 2 (two) times daily.   Yes Historical Provider, MD  traMADol (ULTRAM) 50 MG tablet Take 100 mg by mouth every 8 (eight) hours as needed for moderate pain.   Yes Historical Provider, MD  traZODone (DESYREL) 100 MG tablet Take 100 mg by mouth at bedtime.   Yes Historical Provider, MD  vitamin C (ASCORBIC ACID) 500 MG tablet Take 500 mg by mouth every morning.   Yes Historical Provider, MD  lactose free nutrition (BOOST PLUS) LIQD Take 237 mLs by mouth 3 (three) times daily with meals. 10/16/13   [name], MD   BP 96/46  Pulse 78  Temp(Src) 98.8 F (  37.1 C) (Oral)  Resp 20  SpO2 98% Physical Exam  Nursing note and vitals reviewed. Constitutional: She is oriented to person, place, and time. She appears well-developed and well-nourished. No distress.  HENT:  Head: Normocephalic and atraumatic.  Eyes: EOM are normal. Pupils are equal, round, and reactive to light.  Cardiovascular: Normal rate, regular rhythm, normal heart sounds and intact distal pulses.   No murmur heard. Pulmonary/Chest: Effort normal. She has no wheezes. She has no rales. She exhibits no tenderness.  Abdominal: Soft. She exhibits no distension. There is no tenderness. There is no rebound and no guarding.  Musculoskeletal:  Left AKA.  Tenderness over the left hip with palpation.  No deformity noted.  Tenderness with palpation of the lumbar and thoracic paraspinal muscles and mild midline tenderness.    Neurological: She is alert and oriented to person, place, and time. No cranial nerve deficit. Coordination normal.  Normal sensation and movement of limbs  Skin: Skin is warm and dry. No rash noted. No erythema.  Psychiatric: She has a normal mood and affect. Her behavior is normal.    ED Course  Procedures (including critical care time) Labs Review Labs Reviewed   CBG MONITORING, ED - Abnormal; Notable for the following:    Glucose-Capillary 219 (*)    All other components within normal limits    Imaging Review Dg Thoracic Spine W/swimmers  11/18/2013   CLINICAL DATA:  Fall  EXAM: THORACIC SPINE - 2 VIEW + SWIMMERS  COMPARISON:  06/02/2013  FINDINGS: There is anatomic alignment of the vertebral bodies. No vertebral compression deformity. There is disc space narrowing in the mid thoracic spine.  IMPRESSION: No acute bony pathology.   Electronically Signed   By: Maryclare Bean M.D.   On: 11/18/2013 17:01   Dg Lumbar Spine Complete  11/18/2013   CLINICAL DATA:  fall, pain  EXAM: LUMBAR SPINE - COMPLETE 4+ VIEW  COMPARISON:  None.  FINDINGS: There is no evidence of lumbar spine fracture. Alignment is normal. Intervertebral disc spaces are maintained. Coarse atherosclerotic calcifications of the distal abdominal aorta  IMPRESSION: Negative.   Electronically Signed   By: Margaree Mackintosh M.D.   On: 11/18/2013 17:00   Dg Hip Complete Left  11/18/2013   CLINICAL DATA:  Fall.  Left hip pain.  EXAM: LEFT HIP - COMPLETE 2+ VIEW  COMPARISON:  None.  FINDINGS: No acute bony abnormality. Specifically, no fracture, subluxation, or dislocation. Soft tissues are intact. SI joints and hip joints are symmetric and unremarkable.  IMPRESSION: No acute bony abnormality.   Electronically Signed   By: Rolm Baptise M.D.   On: 11/18/2013 17:04     EKG Interpretation None      MDM   Final diagnoses:  Fall, initial encounter  Contusion    Patient with a mechanical fall approximately one hour ago out of a truck when her prosthesis at 10. She fell on her left hip is complaining of left hip pain, L-spine and T-spine pain. She denies head injury or LOC. Patient is otherwise neurologically intact. No abdominal or chest pain.  Patient given pain control. L-spine, T-spine, left hip imaging pending  5:19 PM Imaging neg for acute process.  Pt pain improved with meds and will d/c with  pain control.  Pt states she hasn't eaten all day and is feeling shaky.  Initial BS was 219 but will recheck and feed pt.  6:40 PM Repeat BS wnl.  Will d/c home.  Blanchie Dessert, MD 11/18/13  [place], MD 11/18/13 [place], MD 11/18/13 1842

## 2013-11-18 NOTE — Progress Notes (Signed)
CSW met with patient at bedside to complete this assessment.  Patient is a little drowsy but able to answer questions appropriately.  Patient reports that she lives alone but has an aide that comes in the home for 3 hours daily name [name] with [employer] in [place], [place].  The patient reports that she is currently on the wait list with [facility] services attempting to increase service hours.  She reports having other supports such as her nephew that brought her to the ED, niece, and son.  She reports that her son has a disability so is only able to provide limited support.  She reports being depressed but denies SI/HI/hallucinations.  She reports having some grief from the loss of her multiple family member to diabetes and her own struggles with diabetes.  The patient became tearful during the interview and CSW provided a supportive listening environment.  She reports being lonely and needing someone to talk to during the day.  She reports in the patient participating with a psychologist but never medication management.  CSW provided the patient with resources for outpatient mental health agencies, senior resources of [place], [place], and [facility]. Patient reports she already has assistance devices at home such as 2 walkers, bedside commodes, shower chairs, and elevated toilet seat.  No Further follow needed at this time.  Patient called nephew to pick up for discharge.     Chesley Noon, MSW, [place], 11/18/2013 Evening Clinical Social Worker [phone]

## 2013-11-18 NOTE — ED Notes (Signed)
Pt in radiology 

## 2013-11-18 NOTE — ED Notes (Signed)
Pt c/o of fall in parking lot today. C/o of left hip pain towards rear. Below the knee amputee. Denies LOC.

## 2013-11-19 ENCOUNTER — Other Ambulatory Visit: Payer: Self-pay | Admitting: *Deleted

## 2013-11-19 LAB — CBG MONITORING, ED: Glucose-Capillary: 132 mg/dL — ABNORMAL HIGH (ref 70–99)

## 2013-11-19 MED ORDER — OXYCODONE-ACETAMINOPHEN 10-325 MG PO TABS: 1.0000 | ORAL_TABLET | Freq: Four times a day (QID) | ORAL | Status: DC | PRN

## 2013-11-19 NOTE — Telephone Encounter (Signed)
Rx placed in folder with signed Rx - appt made with Dr Algis Liming 11/20/13 1:45PM - pt aware. Pt went to [facility] ER and got 15 Percocets on 11/18/13.

## 2013-11-20 ENCOUNTER — Ambulatory Visit (INDEPENDENT_AMBULATORY_CARE_PROVIDER_SITE_OTHER): Payer: Medicare Other | Admitting: Internal Medicine

## 2013-11-20 ENCOUNTER — Encounter: Payer: Self-pay | Admitting: Internal Medicine

## 2013-11-20 VITALS — BP 91/58 | HR 92 | Temp 97.8°F | Ht 63.0 in | Wt 175.6 lb

## 2013-11-20 DIAGNOSIS — E118 Type 2 diabetes mellitus with unspecified complications: Secondary | ICD-10-CM

## 2013-11-20 DIAGNOSIS — T889XXS Complication of surgical and medical care, unspecified, sequela: Secondary | ICD-10-CM | POA: Diagnosis not present

## 2013-11-20 DIAGNOSIS — E1165 Type 2 diabetes mellitus with hyperglycemia: Secondary | ICD-10-CM | POA: Diagnosis not present

## 2013-11-20 DIAGNOSIS — IMO0002 Reserved for concepts with insufficient information to code with codable children: Secondary | ICD-10-CM

## 2013-11-20 DIAGNOSIS — T8189XS Other complications of procedures, not elsewhere classified, sequela: Secondary | ICD-10-CM

## 2013-11-20 MED ORDER — OXYCODONE-ACETAMINOPHEN 10-325 MG PO TABS: 1.0000 | ORAL_TABLET | Freq: Four times a day (QID) | ORAL | Status: DC | PRN

## 2013-11-20 NOTE — Progress Notes (Signed)
Subjective:    Patient ID: [name], female    DOB: [birth year], 63 y.o.   MRN: [id number]  HPI [name] is a 63 yo woman pmh as listed below presents for pain medication.   Pt does have a pain contract with the clinic but has been receiving tramadol from other providers. This is very inappropriate as it violates the pain contract and the patient is already taking opiates. The patient wanted to understand and parameters of her pain contract were reviewed with the patient. The patient verbalized understanding that she's not be receiving any other narcotics from other providers and that tramadol is inappropriate in combination with her already prescribed stronger opiates.  The patient is continued to take her metformin for her diabetes and is anticipating her abdominal hernia repair. She will be going to followup with surgery sometime in July or August.  Past Medical History  Diagnosis Date  . Hypertension   . Colon polyps   . Stomach problems   . Hypokalemic alkalosis   . Allergic rhinitis   . GERD (gastroesophageal reflux disease)   . Stroke 2010    no residual problems  . Anemia, iron deficiency 05/03/2011    recieves iron infusions  . Anemia   . GI bleeding   . Diverticulitis   . Transfusion history   . Difficulty sleeping     takes trazadone for sleep  . Peripheral vascular disease   . Hepatitis     hep C  . Anxiety   . Shortness of breath     due to anemia    Current Outpatient Prescriptions on File Prior to Visit  Medication Sig Dispense Refill  . aspirin EC 81 MG tablet Take 162 mg by mouth 2 (two) times daily.      Marland Kitchen atenolol (TENORMIN) 50 MG tablet Take 50 mg by mouth every morning.       . cetirizine (ZYRTEC) 10 MG tablet Take 10 mg by mouth every morning.       . cyclobenzaprine (FLEXERIL) 10 MG tablet Take 1 tablet (10 mg total) by mouth 2 (two) times daily as needed for muscle spasms.  20 tablet  0  . esomeprazole (NEXIUM) 40 MG capsule Take 40 mg by  mouth 2 (two) times daily.      . fluorometholone (FML) 0.1 % ophthalmic suspension Place 1 drop into the left eye 2 (two) times daily.      Marland Kitchen FLUoxetine (PROZAC) 20 MG tablet Take 40 mg by mouth every morning.       . gabapentin (NEURONTIN) 400 MG capsule Take 800 mg by mouth 3 (three) times daily.      . hydrochlorothiazide (HYDRODIURIL) 25 MG tablet Take 25 mg by mouth every morning.      . lactose free nutrition (BOOST PLUS) LIQD Take 237 mLs by mouth 3 (three) times daily with meals.  237 mL  0  . lisinopril (PRINIVIL,ZESTRIL) 40 MG tablet Take 40 mg by mouth every morning.       . metFORMIN (GLUCOPHAGE) 500 MG tablet Take 500 mg by mouth daily with breakfast.      . potassium chloride 20 MEQ/15ML (10%) solution Take 20 mEq by mouth every morning.       . prednisoLONE acetate (PRED FORTE) 1 % ophthalmic suspension Place 1 drop into the left eye 3 (three) times daily.      Marland Kitchen Specialty Vitamins Products (MAGNESIUM, AMINO ACID CHELATE,) 133 MG tablet Take 1 tablet by mouth  2 (two) times daily.      . traMADol (ULTRAM) 50 MG tablet Take 100 mg by mouth every 8 (eight) hours as needed for moderate pain.      . traZODone (DESYREL) 100 MG tablet Take 100 mg by mouth at bedtime.      . vitamin C (ASCORBIC ACID) 500 MG tablet Take 500 mg by mouth every morning.       No current facility-administered medications on file prior to visit.   Social, surgical, family history reviewed with patient and updated in appropriate chart locations.   Review of Systems  Negative except As listed in history of present illness     Objective:   Physical Exam Filed Vitals:   11/20/13 1353  BP: 91/58  Pulse: 92  Temp: 97.8 F (36.6 C)   General: sitting in wheelchair HEENT: PERRL, EOMI, no scleral icterus Cardiac: RRR, no rubs, murmurs or gallops Pulm: clear to auscultation bilaterally, moving normal volumes of air Abd: soft, nontender, nondistended, BS present Ext: warm and well perfused, no pedal  edema, large periumbilical hernia still reducible no signs of incarceration or gangrene Pink granulation tissue around mid abdominal incision well healing with only a 5 mm area of exposed skin Neuro: alert and oriented X3, cranial nerves II-XII grossly intact    Assessment & Plan:  Please see problem oriented charting  Pt discussed with Dr. Marinda Elk

## 2013-11-20 NOTE — Progress Notes (Signed)
Case discussed with Dr. Sadek soon after the resident saw the patient.  We reviewed the resident's history and exam and pertinent patient test results.  I agree with the assessment, diagnosis, and plan of care documented in the resident's note. 

## 2013-11-20 NOTE — Assessment & Plan Note (Signed)
Pt is still awaiting surgical repair once DM is under control. The patient was informed again of the parameters of her pain contract that was initially signed in that she's not be going to other providers for opiates including tramadol. And that refill of tramadol or prescribing this would be unsafe for the patient she is already on opiates. The patient verbalized understanding and will continue to only receive narcotics from this provider with reevaluation after her abdominal hernia repair. -refill of Percocet 10-325 Q8

## 2013-11-23 ENCOUNTER — Other Ambulatory Visit (INDEPENDENT_AMBULATORY_CARE_PROVIDER_SITE_OTHER): Payer: Self-pay | Admitting: Surgery

## 2013-11-23 ENCOUNTER — Telehealth (INDEPENDENT_AMBULATORY_CARE_PROVIDER_SITE_OTHER): Payer: Self-pay | Admitting: General Surgery

## 2013-11-23 ENCOUNTER — Other Ambulatory Visit: Payer: Self-pay | Admitting: Internal Medicine

## 2013-11-23 ENCOUNTER — Telehealth: Payer: Self-pay | Admitting: *Deleted

## 2013-11-23 LAB — GLUCOSE, CAPILLARY: GLUCOSE-CAPILLARY: 196 mg/dL — AB (ref 70–99)

## 2013-11-23 NOTE — Telephone Encounter (Signed)
Pt came into the clinic at 5:00 with a percocet Rx that was dated "do no fill until 7/7" Pt is very upset because she wanted to fill it today. Dr Algis Liming paged X2, no response  Did you want to hold the Rx until 7/7??  Patient quite upset.

## 2013-11-23 NOTE — Telephone Encounter (Signed)
Mailed Rx to patient home/ patient is home bound and can not come get Rx. Okay per Dr. Ninfa Linden

## 2013-11-24 ENCOUNTER — Telehealth: Payer: Self-pay | Admitting: Internal Medicine

## 2013-11-24 DIAGNOSIS — Z961 Presence of intraocular lens: Secondary | ICD-10-CM | POA: Diagnosis not present

## 2013-11-24 DIAGNOSIS — H1045 Other chronic allergic conjunctivitis: Secondary | ICD-10-CM | POA: Diagnosis not present

## 2013-11-24 DIAGNOSIS — H26499 Other secondary cataract, unspecified eye: Secondary | ICD-10-CM | POA: Diagnosis not present

## 2013-11-24 NOTE — Telephone Encounter (Signed)
[name] called and given message.  [name] states that Dr Algis Liming told [name] to destroy the Rx from the ED.  Therefore [name] is now out of meds. [name] request the Dr Algis Liming call her about this.

## 2013-11-24 NOTE — Telephone Encounter (Signed)
Yes please delay until 12/01/13 since patient has received narcotics from ED. This was discussed during our last clinic visit the day the prescription was given. Thanks.

## 2013-11-24 NOTE — Telephone Encounter (Signed)
Call was made to [facility] given concern that patient had called surgery office for tramadol prescription (11/23/13) that is in clear violation of pain contract and was just discussed with patient during last [facility] visit on 11/20/13. This was verified that a prescription had been made but prior authorization had not been completed and that prescription had yet to be filled. The pharmacy was informed to cancel future tramadol prescriptions as the patient is on chronic opiates. The patient needs an outpatient appointment to discuss opiate titration to prevent withdrawal given long standing use since surgery.

## 2013-11-24 NOTE — Telephone Encounter (Signed)
no vm available....mailed pt appt sched and letter °

## 2013-11-30 ENCOUNTER — Ambulatory Visit: Payer: Medicare Other

## 2013-11-30 ENCOUNTER — Other Ambulatory Visit: Payer: Medicare Other

## 2013-12-07 ENCOUNTER — Other Ambulatory Visit: Payer: Medicare Other

## 2013-12-07 ENCOUNTER — Ambulatory Visit: Payer: Medicare Other

## 2013-12-09 ENCOUNTER — Other Ambulatory Visit: Payer: Self-pay | Admitting: *Deleted

## 2013-12-10 MED ORDER — LISINOPRIL 40 MG PO TABS: 40.0000 mg | ORAL_TABLET | Freq: Every morning | ORAL | Status: DC

## 2013-12-18 ENCOUNTER — Encounter: Payer: Self-pay | Admitting: Internal Medicine

## 2013-12-18 ENCOUNTER — Ambulatory Visit (INDEPENDENT_AMBULATORY_CARE_PROVIDER_SITE_OTHER): Payer: Medicare Other | Admitting: Internal Medicine

## 2013-12-18 VITALS — BP 103/64 | HR 69 | Temp 98.0°F | Ht 63.0 in | Wt 174.7 lb

## 2013-12-18 DIAGNOSIS — IMO0002 Reserved for concepts with insufficient information to code with codable children: Secondary | ICD-10-CM

## 2013-12-18 DIAGNOSIS — T889XXS Complication of surgical and medical care, unspecified, sequela: Secondary | ICD-10-CM

## 2013-12-18 DIAGNOSIS — E1165 Type 2 diabetes mellitus with hyperglycemia: Secondary | ICD-10-CM

## 2013-12-18 DIAGNOSIS — E118 Type 2 diabetes mellitus with unspecified complications: Principal | ICD-10-CM

## 2013-12-18 DIAGNOSIS — T8189XS Other complications of procedures, not elsewhere classified, sequela: Secondary | ICD-10-CM

## 2013-12-18 LAB — POCT GLYCOSYLATED HEMOGLOBIN (HGB A1C): HEMOGLOBIN A1C: 6.1

## 2013-12-18 LAB — GLUCOSE, CAPILLARY: Glucose-Capillary: 138 mg/dL — ABNORMAL HIGH (ref 70–99)

## 2013-12-18 MED ORDER — OXYCODONE-ACETAMINOPHEN 10-325 MG PO TABS: 1.0000 | ORAL_TABLET | Freq: Four times a day (QID) | ORAL | Status: DC | PRN

## 2013-12-18 NOTE — Progress Notes (Signed)
Subjective:    Patient ID: [name], female    DOB: Apr 16, 1951, 63 y.o.   MRN: [id number]  HPI [name] is a 63 yo woman pmh as listed below presents for DM recheck.   DM - Patient checking blood sugars 1 time daily, before breakfast or sometimes before dinner. Inconsistently timed throughout the day. Her CBGs have been averaging in the 160s with the highest being 232 and the lowest being 133.Currently taking metformin 500mg  qdaily. No hypoglycemic episodes since last visit. denies polyuria, polydipsia, nausea, vomiting, diarrhea.  does not request refills today. She states that she has only recently started been compliant with her metformin and checking her CBGs.  Also in terms of her depression she states "I had a conversation with the lowered" and has decided to be compliant for the past 2 weeks she's been taking her Prozac as previous he prescribed and has noticed some results. She is very optimistic in terms of her efforts to being able to pursue her elective abdominal hernia repair.she has not had any new abdominal pain, having consistent regular bowel movements that are soft in nature, no diarrhea, no constipation, no fevers or chills.   Past Medical History  Diagnosis Date  . Hypertension   . Colon polyps   . Stomach problems   . Hypokalemic alkalosis   . Allergic rhinitis   . GERD (gastroesophageal reflux disease)   . Stroke 2010    no residual problems  . Anemia, iron deficiency 05/03/2011    recieves iron infusions  . Anemia   . GI bleeding   . Diverticulitis   . Transfusion history   . Difficulty sleeping     takes trazadone for sleep  . Peripheral vascular disease   . Hepatitis     hep C  . Anxiety   . Shortness of breath     due to anemia   Current Outpatient Prescriptions on File Prior to Visit  Medication Sig Dispense Refill  . aspirin EC 81 MG tablet Take 162 mg by mouth 2 (two) times daily.      Marland Kitchen atenolol (TENORMIN) 50 MG tablet Take 50 mg by  mouth every morning.       Marland Kitchen atenolol (TENORMIN) 50 MG tablet Take 1 tablet (50 mg total) by mouth daily.  30 tablet  10  . cetirizine (ZYRTEC) 10 MG tablet Take 10 mg by mouth every morning.       . cyclobenzaprine (FLEXERIL) 10 MG tablet Take 1 tablet (10 mg total) by mouth 2 (two) times daily as needed for muscle spasms.  20 tablet  0  . esomeprazole (NEXIUM) 40 MG capsule Take 40 mg by mouth 2 (two) times daily.      . fluorometholone (FML) 0.1 % ophthalmic suspension Place 1 drop into the left eye 2 (two) times daily.      Marland Kitchen FLUoxetine (PROZAC) 20 MG tablet Take 40 mg by mouth every morning.       . gabapentin (NEURONTIN) 400 MG capsule Take 800 mg by mouth 3 (three) times daily.      . hydrochlorothiazide (HYDRODIURIL) 25 MG tablet Take 25 mg by mouth every morning.      . lactose free nutrition (BOOST PLUS) LIQD Take 237 mLs by mouth 3 (three) times daily with meals.  237 mL  0  . lisinopril (PRINIVIL,ZESTRIL) 40 MG tablet Take 1 tablet (40 mg total) by mouth every morning.  90 tablet  4  . metFORMIN (GLUCOPHAGE) 500  MG tablet Take 500 mg by mouth daily with breakfast.      . oxyCODONE-acetaminophen (PERCOCET) 10-325 MG per tablet Take 1 tablet by mouth every 6 (six) hours as needed for pain.  120 tablet  0  . potassium chloride 20 MEQ/15ML (10%) solution Take 20 mEq by mouth every morning.       . prednisoLONE acetate (PRED FORTE) 1 % ophthalmic suspension Place 1 drop into the left eye 3 (three) times daily.      Marland Kitchen Specialty Vitamins Products (MAGNESIUM, AMINO ACID CHELATE,) 133 MG tablet Take 1 tablet by mouth 2 (two) times daily.      . traZODone (DESYREL) 100 MG tablet Take 100 mg by mouth at bedtime.      . vitamin C (ASCORBIC ACID) 500 MG tablet Take 500 mg by mouth every morning.       No current facility-administered medications on file prior to visit.   Social, surgical, family history reviewed with patient and updated in appropriate chart locations.   Review of  Systems Negative except As listed in history of present illness     Objective:   Physical Exam Filed Vitals:   12/18/13 1318  BP: 103/64  Pulse: 69  Temp: 98 F (36.7 C)   General: sitting in chair, NAD HEENT: PERRL, EOMI, no scleral icterus Cardiac: RRR, no rubs, murmurs or gallops Pulm: clear to auscultation bilaterally, moving normal volumes of air Abd: soft, nontender, nondistended, large midline incision with pink granulation tissue, a large easily reducible right lower quadrant hernia, BS present Ext: warm and well perfused, no pedal edema, left BKA Neuro: alert and oriented X3, cranial nerves II-XII grossly intact    Assessment & Plan:  Please see problem oriented charting  Pt discussed with Dr. Beryle Beams

## 2013-12-18 NOTE — Progress Notes (Signed)
Attending physician note: Presenting problems, physical findings, medications, reviewed with resident physician Dr. Sol Blazing and I concur with her assessment and management plan. Murriel Hopper M.D. FACP

## 2013-12-18 NOTE — Assessment & Plan Note (Addendum)
Lab Results  Component Value Date   HGBA1C 6.1 12/18/2013   HGBA1C 6.2 09/18/2013   HGBA1C 6.6 06/05/2013     Assessment: Diabetes control:  adequate Progress toward A1C goal:    at goal Comments: pt A1c is not a direct true representation given significant anemia and multiple transfusions, a more accurate representation is her CBG log   Plan: Medications:  continue current medications metformin 500mg  qd may need to increase to BID if the patient is compliant (has been very resistant in the past has been huge success to just get acknowledgement of disease and daily medication) Home glucose monitoring: Frequency:   Timing:   Instruction/counseling given: reminded to get eye exam, reminded to bring blood glucose meter & log to each visit, reminded to bring medications to each visit, discussed foot care, discussed the need for weight loss and discussed diet Educational resources provided: brochure Self management tools provided:   Other plans: will continue current management, FLP ordered today

## 2013-12-18 NOTE — Patient Instructions (Signed)
General Instructions: You are doing great with your diabetes! Keep up the good work and get ready for surgery.   Please bring your medicines with you each time you come to clinic.  Medicines may include prescription medications, over-the-counter medications, herbal remedies, eye drops, vitamins, or other pills.   Progress Toward Treatment Goals:  Treatment Goal 08/18/2012  Blood pressure unchanged  Stop smoking smoking the same amount    Self Care Goals & Plans:  Self Care Goal 12/18/2013  Manage my medications take my medicines as prescribed; bring my medications to every visit; refill my medications on time; follow the sick day instructions if I am sick  Monitor my health keep track of my blood glucose; bring my glucose meter and log to each visit; keep track of my blood pressure  Eat healthy foods drink diet soda or water instead of juice or soda; eat more vegetables; eat foods that are low in salt; eat baked foods instead of fried foods; eat fruit for snacks and desserts  Be physically active -  Stop smoking -  Prevent falls -    No flowsheet data found.   Care Management & Community Referrals:  No flowsheet data found.

## 2013-12-18 NOTE — Assessment & Plan Note (Signed)
>>  ASSESSMENT AND PLAN FOR TYPE 2 DIABETES MELLITUS WITH DIABETIC NEUROPATHY ([facility]) WRITTEN ON 12/18/2013  2:08 PM BY MONTY BAIL, MD  Lab Results  Component Value Date   HGBA1C 6.1 12/18/2013   HGBA1C 6.2 09/18/2013   HGBA1C 6.6 06/05/2013     Assessment: Diabetes control:  adequate Progress toward A1C goal:    at goal Comments: pt A1c is not a direct true representation given significant anemia and multiple transfusions, a more accurate representation is her CBG log   Plan: Medications:  continue current medications metformin  500mg  qd may need to increase to BID if the patient is compliant (has been very resistant in the past has been huge success to just get acknowledgement of disease and daily medication) Home glucose monitoring: Frequency:   Timing:   Instruction/counseling given: reminded to get eye exam, reminded to bring blood glucose meter & log to each visit, reminded to bring medications to each visit, discussed foot care, discussed the need for weight loss and discussed diet Educational resources provided: brochure Self management tools provided:   Other plans: will continue current management, FLP ordered today

## 2013-12-19 LAB — LIPID PANEL
CHOL/HDL RATIO: 3.7 ratio
Cholesterol: 99 mg/dL (ref 0–200)
HDL: 27 mg/dL — ABNORMAL LOW (ref >39)
LDL Cholesterol: 41 mg/dL (ref 0–99)
Triglycerides: 153 mg/dL — ABNORMAL HIGH (ref <150)
VLDL: 31 mg/dL (ref 0–40)

## 2013-12-24 ENCOUNTER — Other Ambulatory Visit: Payer: Self-pay | Admitting: Internal Medicine

## 2014-01-07 ENCOUNTER — Telehealth: Payer: Self-pay | Admitting: Internal Medicine

## 2014-01-07 ENCOUNTER — Telehealth: Payer: Self-pay

## 2014-01-07 NOTE — Telephone Encounter (Signed)
Pt called [facility] that she is very weak and needs an infusion. S/w Dr Juliann Mule and [facility] sent for OV and infusion. Called pt back with plan.

## 2014-01-07 NOTE — Telephone Encounter (Signed)
returned pts call and per pt r/s missed july appt to 8/21. pt has new d/t for lb/DC @ 8:30am.

## 2014-01-11 ENCOUNTER — Telehealth: Payer: Self-pay | Admitting: Hematology and Oncology

## 2014-01-11 NOTE — Telephone Encounter (Signed)
returned pt call and confirmed appts....pt ok and aware °

## 2014-01-15 ENCOUNTER — Encounter: Payer: Medicare Other | Admitting: Dietician

## 2014-01-15 ENCOUNTER — Ambulatory Visit (HOSPITAL_COMMUNITY)
Admission: RE | Admit: 2014-01-15 | Discharge: 2014-01-15 | Disposition: A | Discharge status: Home / Self Care | Payer: Medicare Other | Source: Ambulatory Visit | Attending: Internal Medicine | Admitting: Internal Medicine

## 2014-01-15 ENCOUNTER — Telehealth: Payer: Self-pay | Admitting: Internal Medicine

## 2014-01-15 ENCOUNTER — Ambulatory Visit (HOSPITAL_BASED_OUTPATIENT_CLINIC_OR_DEPARTMENT_OTHER): Payer: Medicare Other | Admitting: Internal Medicine

## 2014-01-15 ENCOUNTER — Ambulatory Visit (HOSPITAL_BASED_OUTPATIENT_CLINIC_OR_DEPARTMENT_OTHER): Payer: Medicare Other

## 2014-01-15 ENCOUNTER — Other Ambulatory Visit (HOSPITAL_BASED_OUTPATIENT_CLINIC_OR_DEPARTMENT_OTHER): Payer: Medicare Other

## 2014-01-15 VITALS — BP 118/50 | HR 76 | Temp 98.1°F | Resp 20

## 2014-01-15 VITALS — BP 108/53 | HR 72 | Temp 98.2°F | Resp 19 | Ht 63.0 in | Wt 177.8 lb

## 2014-01-15 DIAGNOSIS — D500 Iron deficiency anemia secondary to blood loss (chronic): Secondary | ICD-10-CM | POA: Diagnosis not present

## 2014-01-15 DIAGNOSIS — R7402 Elevation of levels of lactic acid dehydrogenase (LDH): Secondary | ICD-10-CM

## 2014-01-15 DIAGNOSIS — B182 Chronic viral hepatitis C: Secondary | ICD-10-CM

## 2014-01-15 DIAGNOSIS — R7401 Elevation of levels of liver transaminase levels: Secondary | ICD-10-CM

## 2014-01-15 DIAGNOSIS — R740 Nonspecific elevation of levels of transaminase and lactic acid dehydrogenase [LDH]: Secondary | ICD-10-CM

## 2014-01-15 LAB — CBC WITH DIFFERENTIAL/PLATELET
BASO%: 0.4 % (ref 0.0–2.0)
BASOS ABS: 0 10*3/uL (ref 0.0–0.1)
EOS%: 1.1 % (ref 0.0–7.0)
Eosinophils Absolute: 0.1 10*3/uL (ref 0.0–0.5)
HCT: 22.6 % — ABNORMAL LOW (ref 34.8–46.6)
HEMOGLOBIN: 6.8 g/dL — AB (ref 11.6–15.9)
LYMPH%: 30.9 % (ref 14.0–49.7)
MCH: 23.5 pg — AB (ref 25.1–34.0)
MCHC: 30.1 g/dL — AB (ref 31.5–36.0)
MCV: 78.2 fL — ABNORMAL LOW (ref 79.5–101.0)
MONO#: 0.7 10*3/uL (ref 0.1–0.9)
MONO%: 12.9 % (ref 0.0–14.0)
NEUT#: 3.1 10*3/uL (ref 1.5–6.5)
NEUT%: 54.7 % (ref 38.4–76.8)
PLATELETS: 349 10*3/uL (ref 145–400)
RBC: 2.89 10*6/uL — ABNORMAL LOW (ref 3.70–5.45)
RDW: 17.9 % — ABNORMAL HIGH (ref 11.2–14.5)
WBC: 5.6 10*3/uL (ref 3.9–10.3)
lymph#: 1.7 10*3/uL (ref 0.9–3.3)

## 2014-01-15 LAB — COMPREHENSIVE METABOLIC PANEL (CC13)
ALBUMIN: 3 g/dL — AB (ref 3.5–5.0)
ALT: 54 U/L (ref 0–55)
ANION GAP: 8 meq/L (ref 3–11)
AST: 67 U/L — ABNORMAL HIGH (ref 5–34)
Alkaline Phosphatase: 83 U/L (ref 40–150)
BUN: 14.3 mg/dL (ref 7.0–26.0)
CALCIUM: 8.5 mg/dL (ref 8.4–10.4)
CO2: 24 meq/L (ref 22–29)
Chloride: 102 mEq/L (ref 98–109)
Creatinine: 0.9 mg/dL (ref 0.6–1.1)
Glucose: 186 mg/dl — ABNORMAL HIGH (ref 70–140)
POTASSIUM: 4 meq/L (ref 3.5–5.1)
SODIUM: 134 meq/L — AB (ref 136–145)
TOTAL PROTEIN: 6.4 g/dL (ref 6.4–8.3)
Total Bilirubin: <0.2 mg/dL (ref 0.20–1.20)

## 2014-01-15 LAB — FERRITIN CHCC: FERRITIN: 11 ng/mL (ref 9–269)

## 2014-01-15 LAB — IRON AND TIBC CHCC
%SAT: 4 % — ABNORMAL LOW (ref 21–57)
IRON: 21 ug/dL — AB (ref 41–142)
TIBC: 538 ug/dL — ABNORMAL HIGH (ref 236–444)
UIBC: 517 ug/dL — ABNORMAL HIGH (ref 120–384)

## 2014-01-15 LAB — PREPARE RBC (CROSSMATCH)

## 2014-01-15 LAB — HOLD TUBE, BLOOD BANK

## 2014-01-15 MED ORDER — DIPHENHYDRAMINE HCL 25 MG PO CAPS
ORAL_CAPSULE | ORAL | Status: AC
  Filled 2014-01-15: qty 1

## 2014-01-15 MED ORDER — SODIUM CHLORIDE 0.9 % IV SOLN
250.0000 mL | Freq: Once | INTRAVENOUS | Status: AC
  Administered 2014-01-15: 250 mL via INTRAVENOUS

## 2014-01-15 MED ORDER — FERUMOXYTOL INJECTION 510 MG/17 ML
1020.0000 mg | Freq: Once | INTRAVENOUS | Status: AC
  Administered 2014-01-15: 1020 mg via INTRAVENOUS
  Filled 2014-01-15: qty 34

## 2014-01-15 MED ORDER — METHYLPREDNISOLONE SODIUM SUCC 125 MG IJ SOLR
INTRAMUSCULAR | Status: AC
  Filled 2014-01-15: qty 2

## 2014-01-15 MED ORDER — DIPHENHYDRAMINE HCL 25 MG PO CAPS
25.0000 mg | ORAL_CAPSULE | Freq: Once | ORAL | Status: AC
  Administered 2014-01-15: 25 mg via ORAL

## 2014-01-15 MED ORDER — ACETAMINOPHEN 325 MG PO TABS
650.0000 mg | ORAL_TABLET | Freq: Once | ORAL | Status: AC
  Administered 2014-01-15: 650 mg via ORAL

## 2014-01-15 MED ORDER — METHYLPREDNISOLONE SODIUM SUCC 125 MG IJ SOLR
80.0000 mg | Freq: Once | INTRAMUSCULAR | Status: AC
  Administered 2014-01-15: 80 mg via INTRAVENOUS

## 2014-01-15 MED ORDER — SODIUM CHLORIDE 0.9 % IV SOLN
Freq: Once | INTRAVENOUS | Status: AC
  Administered 2014-01-15: 14:00:00 via INTRAVENOUS

## 2014-01-15 NOTE — Patient Instructions (Addendum)
Anemia, Nonspecific Anemia is a condition in which the concentration of red blood cells or hemoglobin in the blood is below normal. Hemoglobin is a substance in red blood cells that carries oxygen to the tissues of the body. Anemia results in not enough oxygen reaching these tissues.  CAUSES  Common causes of anemia include:   Excessive bleeding. Bleeding may be internal or external. This includes excessive bleeding from periods (in women) or from the intestine.   Poor nutrition.   Chronic kidney, thyroid, and liver disease.  Bone marrow disorders that decrease red blood cell production.  Cancer and treatments for cancer.  HIV, AIDS, and their treatments.  Spleen problems that increase red blood cell destruction.  Blood disorders.  Excess destruction of red blood cells due to infection, medicines, and autoimmune disorders. SIGNS AND SYMPTOMS   Minor weakness.   Dizziness.   Headache.  Palpitations.   Shortness of breath, especially with exercise.   Paleness.  Cold sensitivity.  Indigestion.  Nausea.  Difficulty sleeping.  Difficulty concentrating. Symptoms may occur suddenly or they may develop slowly.  DIAGNOSIS  Additional blood tests are often needed. These help your health care provider determine the best treatment. Your health care provider will check your stool for blood and look for other causes of blood loss.  TREATMENT  Treatment varies depending on the cause of the anemia. Treatment can include:   Supplements of iron, vitamin Z99, or folic acid.   Hormone medicines.   A blood transfusion. This may be needed if blood loss is severe.   Hospitalization. This may be needed if there is significant continual blood loss.   Dietary changes.  Spleen removal. HOME CARE INSTRUCTIONS Keep all follow-up appointments. It often takes many weeks to correct anemia, and having your health care provider check on your condition and your response to  treatment is very important. SEEK IMMEDIATE MEDICAL CARE IF:   You develop extreme weakness, shortness of breath, or chest pain.   You become dizzy or have trouble concentrating.  You develop heavy vaginal bleeding.   You develop a rash.   You have bloody or black, tarry stools.   You faint.   You vomit up blood.   You vomit repeatedly.   You have abdominal pain.  You have a fever or persistent symptoms for more than 2-3 days.   You have a fever and your symptoms suddenly get worse.   You are dehydrated.  MAKE SURE YOU:  Understand these instructions.  Will watch your condition.  Will get help right away if you are not doing well or get worse. Document Released: 06/21/2004 Document Revised: 01/14/2013 Document Reviewed: 11/07/2012 [facility] Patient Information 2015 [place], [place]. This information is not intended to replace advice given to you by your health care provider. Make sure you discuss any questions you have with your health care provider. Blood Transfusion Information WHAT IS A BLOOD TRANSFUSION? A transfusion is the replacement of blood or some of its parts. Blood is made up of multiple cells which provide different functions.  Red blood cells carry oxygen and are used for blood loss replacement.  White blood cells fight against infection.  Platelets control bleeding.  Plasma helps clot blood.  Other blood products are available for specialized needs, such as hemophilia or other clotting disorders. BEFORE THE TRANSFUSION  Who gives blood for transfusions?   You may be able to donate blood to be used at a later date on yourself (autologous donation).  Relatives can be  asked to donate blood. This is generally not any safer than if you have received blood from a stranger. The same precautions are taken to ensure safety when a relative's blood is donated.  Healthy volunteers who are fully evaluated to make sure their blood is safe. This is blood  bank blood. Transfusion therapy is the safest it has ever been in the practice of medicine. Before blood is taken from a donor, a complete history is taken to make sure that person has no history of diseases nor engages in risky social behavior (examples are intravenous drug use or sexual activity with multiple partners). The donor's travel history is screened to minimize risk of transmitting infections, such as malaria. The donated blood is tested for signs of infectious diseases, such as HIV and hepatitis. The blood is then tested to be sure it is compatible with you in order to minimize the chance of a transfusion reaction. If you or a relative donates blood, this is often done in anticipation of surgery and is not appropriate for emergency situations. It takes many days to process the donated blood. RISKS AND COMPLICATIONS Although transfusion therapy is very safe and saves many lives, the main dangers of transfusion include:   Getting an infectious disease.  Developing a transfusion reaction. This is an allergic reaction to something in the blood you were given. Every precaution is taken to prevent this. The decision to have a blood transfusion has been considered carefully by your caregiver before blood is given. Blood is not given unless the benefits outweigh the risks. AFTER THE TRANSFUSION  Right after receiving a blood transfusion, you will usually feel much better and more energetic. This is especially true if your red blood cells have gotten low (anemic). The transfusion raises the level of the red blood cells which carry oxygen, and this usually causes an energy increase.  The nurse administering the transfusion will monitor you carefully for complications. HOME CARE INSTRUCTIONS  No special instructions are needed after a transfusion. You may find your energy is better. Speak with your caregiver about any limitations on activity for underlying diseases you may have. SEEK MEDICAL CARE  IF:   Your condition is not improving after your transfusion.  You develop redness or irritation at the intravenous (IV) site. SEEK IMMEDIATE MEDICAL CARE IF:  Any of the following symptoms occur over the next 12 hours:  Shaking chills.  You have a temperature by mouth above 102 F (38.9 C), not controlled by medicine.  Chest, back, or muscle pain.  People around you feel you are not acting correctly or are confused.  Shortness of breath or difficulty breathing.  Dizziness and fainting.  You get a rash or develop hives.  You have a decrease in urine output.  Your urine turns a dark color or changes to pink, red, or brown. Any of the following symptoms occur over the next 10 days:  You have a temperature by mouth above 102 F (38.9 C), not controlled by medicine.  Shortness of breath.  Weakness after normal activity.  The white part of the eye turns yellow (jaundice).  You have a decrease in the amount of urine or are urinating less often.  Your urine turns a dark color or changes to pink, red, or brown. Document Released: 05/11/2000 Document Revised: 08/06/2011 Document Reviewed: 12/29/2007 [facility] Patient Information 2015 [place], [place]. This information is not intended to replace advice given to you by your health care provider. Make sure you discuss any questions  you have with your health care provider. Blood Transfusion Information WHAT IS A BLOOD TRANSFUSION? A transfusion is the replacement of blood or some of its parts. Blood is made up of multiple cells which provide different functions.  Red blood cells carry oxygen and are used for blood loss replacement.  White blood cells fight against infection.  Platelets control bleeding.  Plasma helps clot blood.  Other blood products are available for specialized needs, such as hemophilia or other clotting disorders. BEFORE THE TRANSFUSION  Who gives blood for transfusions?   You may be able to donate blood  to be used at a later date on yourself (autologous donation).  Relatives can be asked to donate blood. This is generally not any safer than if you have received blood from a stranger. The same precautions are taken to ensure safety when a relative's blood is donated.  Healthy volunteers who are fully evaluated to make sure their blood is safe. This is blood bank blood. Transfusion therapy is the safest it has ever been in the practice of medicine. Before blood is taken from a donor, a complete history is taken to make sure that person has no history of diseases nor engages in risky social behavior (examples are intravenous drug use or sexual activity with multiple partners). The donor's travel history is screened to minimize risk of transmitting infections, such as malaria. The donated blood is tested for signs of infectious diseases, such as HIV and hepatitis. The blood is then tested to be sure it is compatible with you in order to minimize the chance of a transfusion reaction. If you or a relative donates blood, this is often done in anticipation of surgery and is not appropriate for emergency situations. It takes many days to process the donated blood. RISKS AND COMPLICATIONS Although transfusion therapy is very safe and saves many lives, the main dangers of transfusion include:   Getting an infectious disease.  Developing a transfusion reaction. This is an allergic reaction to something in the blood you were given. Every precaution is taken to prevent this. The decision to have a blood transfusion has been considered carefully by your caregiver before blood is given. Blood is not given unless the benefits outweigh the risks. AFTER THE TRANSFUSION  Right after receiving a blood transfusion, you will usually feel much better and more energetic. This is especially true if your red blood cells have gotten low (anemic). The transfusion raises the level of the red blood cells which carry oxygen, and  this usually causes an energy increase.  The nurse administering the transfusion will monitor you carefully for complications. HOME CARE INSTRUCTIONS  No special instructions are needed after a transfusion. You may find your energy is better. Speak with your caregiver about any limitations on activity for underlying diseases you may have. SEEK MEDICAL CARE IF:   Your condition is not improving after your transfusion.  You develop redness or irritation at the intravenous (IV) site. SEEK IMMEDIATE MEDICAL CARE IF:  Any of the following symptoms occur over the next 12 hours:  Shaking chills.  You have a temperature by mouth above 102 F (38.9 C), not controlled by medicine.  Chest, back, or muscle pain.  People around you feel you are not acting correctly or are confused.  Shortness of breath or difficulty breathing.  Dizziness and fainting.  You get a rash or develop hives.  You have a decrease in urine output.  Your urine turns a dark color or changes to  pink, red, or brown. Any of the following symptoms occur over the next 10 days:  You have a temperature by mouth above 102 F (38.9 C), not controlled by medicine.  Shortness of breath.  Weakness after normal activity.  The white part of the eye turns yellow (jaundice).  You have a decrease in the amount of urine or are urinating less often.  Your urine turns a dark color or changes to pink, red, or brown. Document Released: 05/11/2000 Document Revised: 08/06/2011 Document Reviewed: 12/29/2007 [facility] Patient Information 2015 [place], [place]. This information is not intended to replace advice given to you by your health care provider. Make sure you discuss any questions you have with your health care provider.

## 2014-01-15 NOTE — Progress Notes (Signed)
[facility] OFFICE PROGRESS NOTE  [name], MD [place] [address]  DIAGNOSIS: Iron deficiency anemia secondary to blood loss (chronic) - Plan: CBC with Differential, CBC with Differential, Comprehensive metabolic panel (Cmet) - [facility], Lactate dehydrogenase (LDH) - [facility], Care order/instruction, 0.9 %  sodium chloride infusion, sodium chloride 0.9 % injection 10 mL, heparin lock flush 100 unit/mL, heparin lock flush 100 unit/mL, sodium chloride 0.9 % injection 3 mL, acetaminophen (TYLENOL) tablet 650 mg, diphenhydrAMINE (BENADRYL) capsule 25 mg, Transfuse RBC, Ferritin, Iron and TIBC [facility], CBC with Differential, Care order/instruction, Type and screen, 0.9 %  sodium chloride infusion, Prepare RBC, acetaminophen (TYLENOL) tablet 650 mg, diphenhydrAMINE (BENADRYL) capsule 25 mg, CANCELED: Prepare RBC  Secondary hemochromatosis  Chief Complaint  Patient presents with  . Iron deficiency anemia secondary to blood loss (chronic)   PRIOR TREATMENT: The patient has been heavily dependent upon IV Feraheme. Over the past year or so she has been receiving about 1 Feraheme infusion monthly on average. The patient's most recent infusions of IV Feraheme 510 mg occurred on the following dates: 06/05/2011, 07/03/2011, 07/31/2011, 08/17/2011, 08/28/2011. The patient received IV Feraheme 1020 mg on 10/02/2011 01/30/2012, 06/06/2012 and 09/10/2012, 05/2013, 07/2013, 08/2013, 09/2013, 11/12/2013.   CURRENT TREATMENT: As noted above. Feraheme 1020 mg prn.   INTERVAL HISTORY: [name] 63 y.o. female with history of recurrent iron-deficiency anemia due to GI bleeding is here for follow-up.  She was last seen by me on 10/01/2013. GI bleeding is probably due to AV malformations and diverticulosis.  We have been following her CBC and ferritin on a monthly basis except for the time when she was hospitalized. Since August 2013, the patient required IV Feraheme 1020 as noted above.   Today,  the patient is here in a wheelchair but gets around at home with a walker. She lives alone. She reports severe fatigue but denies chest pain.  She also notes low energy.  She denies any hematochezia or melana. She has undergone a left above-the-knee amputation due to necrotizing fasciitis in October 2010. Her abdominal wound has healed but she still has a large hernia but surgery will need to be delayed due to newly diabetes.    MEDICAL HISTORY: Past Medical History  Diagnosis Date  . Hypertension   . Colon polyps   . Stomach problems   . Hypokalemic alkalosis   . Allergic rhinitis   . GERD (gastroesophageal reflux disease)   . Stroke 2010    no residual problems  . Anemia, iron deficiency 05/03/2011    recieves iron infusions  . Anemia   . GI bleeding   . Diverticulitis   . Transfusion history   . Difficulty sleeping     takes trazadone for sleep  . Peripheral vascular disease   . Hepatitis     hep C  . Anxiety   . Shortness of breath     due to anemia   PROBLEM LIST:  1. Recurrent iron-deficiency anemia secondary to gastrointestinal bleeding (as noted above). [name] also has required red cell transfusions in the past, most recently early December 2012. She underwent a small bowel enteroscopy by Dr. Dan Humphreys at [facility] on 08/13/2011. The enteroscope was passed up to 6 feet into the jejunum. He saw multiple AVMs, which were ablated. Two of the large AVMs bled during APC. These were in the 4th part of the duodenum very close to the ligament of Treitz. The  remainder of the AVMs were in the proximal jejunum. In addition to the small bowel AV malformations, which were treated with APC, he saw some small esophageal varices which were not bleeding and also evidence of mild portal hypertensive gastropathy.  2. History of duodenal arteriovenous malformation.  3. History of hepatitis C infection.  4. Gastroesophageal reflux disease.  5.  Depression.  6. Hypertension.  7. History of right Bell's palsy.  8. Status post left above-knee amputation secondary to necrotizing fasciitis 03/06/2009.  9. Neurodermatitis.  10. Previous history of alcohol usage.  11. Admission to the hospital from 12/20/2011 through 12/25/2011 for hematochezia felt to be secondary to a diverticular bleed, requiring 2 units of packed red cells.  12. Diverticulosis.  13. Elevated alpha fetoprotein, 72.3 on 11/19/2011 and 76.0 on 01/11/2012.  14. Abnormal MRI of the liver from 11/28/2011 showing diffuse and markedly low signal intensity throughout the liver as well as diffuse low signal intensity in the adrenal glands. 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 There were no findings for cirrhosis, portal hypertension, splenomegaly or ascites, not were there any worrisome enhancing liver lesions.  15. Systolic ejection murmur.   INTERIM HISTORY: has Essential hypertension, benign; GERD; Necrotizing fasciitis; CARPAL TUNNEL RELEASE, HX OF; Iron deficiency anemia secondary to blood loss (chronic); Chronic hepatitis C without mention of hepatic coma; Ventral hernia; Phantom limb pain; Incisional hernia, without obstruction or gangrene; Secondary hemochromatosis; Type II or unspecified type diabetes mellitus with unspecified complication, uncontrolled; Depression; and Non-healing surgical wound on her problem list.    ALLERGIES:  is allergic to ciprofloxacin; morphine and related; penicillins; codeine; and feraheme.  MEDICATIONS: has a current medication list which includes the following prescription(s): alrex, aspirin ec, atenolol, cetirizine, cyclobenzaprine, esomeprazole, fluorometholone, fluoxetine, gabapentin, hydrochlorothiazide, lisinopril, metformin, oxycodone-acetaminophen, potassium chloride, prednisolone acetate, magnesium (amino acid chelate), trazodone, vitamin c, and lactose free nutrition.  SURGICAL HISTORY:  Past  Surgical History  Procedure Laterality Date  . Leg amputation above knee    . Esophagogastroduodenoscopy  05/05/2011    Procedure: ESOPHAGOGASTRODUODENOSCOPY (EGD);  Surgeon: Zenovia Jarred, MD;  Location: [facility];  Service: Gastroenterology;  Laterality: N/A;  Dr. Hilarie Fredrickson will do procedure for Dr. Benson Norway Saturday.  . Esophagogastroduodenoscopy  05/08/2011    Procedure: ESOPHAGOGASTRODUODENOSCOPY (EGD);  Surgeon: Beryle Beams;  Location: [facility];  Service: Endoscopy;  Laterality: N/A;  . Esophagogastroduodenoscopy  06/07/2011    Procedure: ESOPHAGOGASTRODUODENOSCOPY (EGD);  Surgeon: Beryle Beams, MD;  Location: [facility];  Service: Endoscopy;  Laterality: N/A;  . Hot hemostasis  06/07/2011    Procedure: HOT HEMOSTASIS (ARGON PLASMA COAGULATION/BICAP);  Surgeon: Beryle Beams, MD;  Location: [facility];  Service: Endoscopy;  Laterality: N/A;  . Esophagogastroduodenoscopy  12/20/2011    Procedure: ESOPHAGOGASTRODUODENOSCOPY (EGD);  Surgeon: Beryle Beams, MD;  Location: [facility];  Service: Endoscopy;  Laterality: N/A;  . Flexible sigmoidoscopy  12/21/2011    Procedure: FLEXIBLE SIGMOIDOSCOPY;  Surgeon: Beryle Beams, MD;  Location: [facility];  Service: Endoscopy;  Laterality: N/A;  . Carpal tunnel release      rt hand  . Partial colectomy  02/15/2012    Procedure: PARTIAL COLECTOMY;  Surgeon: Harl Bowie, MD;  Location: [facility];  Service: General;  Laterality: N/A;  . Laparotomy  02/16/2012    Procedure: EXPLORATORY LAPAROTOMY;  Surgeon: Edward Jolly, MD;  Location: [facility];  Service: General;  Laterality: N/A;  oversewing of anastomotic leak and rigid sigmoidoscopy  . Enteroscopy N/A 12/04/2012  Procedure: ENTEROSCOPY;  Surgeon: Beryle Beams, MD;  Location: [facility];  Service: Endoscopy;  Laterality: N/A;  . Enteroscopy N/A 12/18/2012    Procedure: ENTEROSCOPY;  Surgeon: Beryle Beams, MD;  Location: [facility];  Service: Endoscopy;  Laterality: N/A;  . Eye  surgery Bilateral     cataracts    REVIEW OF SYSTEMS:   Constitutional: Denies fevers, chills or abnormal weight loss; Reports increased fatigue.  Eyes: Denies blurriness of vision Ears, nose, mouth, throat, and face: Denies mucositis or sore throat Respiratory: Denies cough, dyspnea or wheezes Cardiovascular: Denies palpitation, chest discomfort or lower extremity swelling Gastrointestinal:  Denies nausea, heartburn or change in bowel habits Skin: Denies abnormal skin rashes Lymphatics: Denies new lymphadenopathy or easy bruising Neurological:Denies numbness, tingling or new weaknesses Behavioral/Psych: Mood is stable, no new changes  All other systems were reviewed with the patient and are negative.  PHYSICAL EXAMINATION: ECOG PERFORMANCE STATUS: 3 - Symptomatic, >50% confined to bed  Blood pressure 108/53, pulse 72, temperature 98.2 F (36.8 C), temperature source Oral, resp. rate 19, height 5\' 3"  (1.6 m), weight 177 lb 12.8 oz (80.65 kg).  General appearance: alert, appears stated age, no distress and moderately obese  Head: Normocephalic, without obvious abnormality, atraumatic  Neck: no adenopathy, supple, symmetrical, trachea midline and thyroid not enlarged, symmetric, no tenderness/mass/nodules  Lymph nodes: Cervical adenopathy: None appreciated  Heart:regular rate and rhythm, S1, S2 normal and systolic murmur: systolic ejection 2/6, harsh and previously documented without radiation  Lung:chest clear, no wheezing, rales, normal symmetric air entry, Heart exam - S1, S2 normal, no murmur, no gallop, rate regular  Abdomin: soft, distended, normal bowel sounds, nontender and incisional hernia and easily reducible and nontender  EXT:No peripheral edema on right extremity; Left above the knee amputation. She is wearing a prothesis.   Labs:  Lab Results  Component Value Date   WBC 5.6 01/15/2014   HGB 6.8* 01/15/2014   HCT 22.6* 01/15/2014   MCV 78.2* 01/15/2014   PLT 349  01/15/2014   NEUTROABS 3.1 01/15/2014      Chemistry      Component Value Date/Time   NA 134* 01/15/2014 0901   NA 128* 04/09/2012 2333   K 4.0 01/15/2014 0901   K 3.8 04/09/2012 2333   CL 104 10/03/2012 1048   CL 92* 04/09/2012 2333   CO2 24 01/15/2014 0901   CO2 27 04/09/2012 2333   BUN 14.3 01/15/2014 0901   BUN 7 04/09/2012 2333   CREATININE 0.9 01/15/2014 0901   CREATININE 0.61 04/09/2012 2333      Component Value Date/Time   CALCIUM 8.5 01/15/2014 0901   CALCIUM 8.0* 04/10/2012 0410   ALKPHOS 83 01/15/2014 0901   ALKPHOS 57 04/09/2012 1615   AST 67* 01/15/2014 0901   AST 45* 04/09/2012 1615   ALT 54 01/15/2014 0901   ALT 40* 04/09/2012 1615   BILITOT <0.20 01/15/2014 0901   BILITOT 0.3 04/09/2012 1615      Iron/TIBC/Ferritin/ %Sat    Component Value Date/Time   IRON 21* 01/15/2014 0901   IRON 27* 10/03/2012 1048   TIBC 538* 01/15/2014 0901   TIBC 434 10/03/2012 1048   FERRITIN 11 01/15/2014 0901   FERRITIN 41 11/17/2012 1537   IRONPCTSAT 4* 01/15/2014 0901   IRONPCTSAT 6* 10/03/2012 1048   IRONPCTSAT NOT CALC TIBC and %SAT were not calculated due to the UIBC being >450. 02/22/2010 0216   CBC:  Recent Labs Lab 01/15/14 0901  WBC 5.6  NEUTROABS  3.1  HGB 6.8*  HCT 22.6*  MCV 78.2*  PLT 349    Anemia work up  Recent Labs  01/15/14 0901  FERRITIN 11  TIBC 538*  IRON 21*    Studies:  No results found.   RADIOGRAPHIC STUDIES: Mm Digital Screening Bilateral  07/31/2013   CLINICAL DATA:  Screening.  EXAM: DIGITAL SCREENING BILATERAL MAMMOGRAM WITH CAD  COMPARISON:  Previous exam(s).  ACR Breast Density Category c: The breast tissue is heterogeneously dense, which may obscure small masses.  FINDINGS: There are no findings suspicious for malignancy. Images were processed with CAD.  IMPRESSION: No mammographic evidence of malignancy. A result letter of this screening mammogram will be mailed directly to the patient.  RECOMMENDATION: Screening mammogram in one year.  (Code:SM-B-01Y)  BI-RADS CATEGORY  1: Negative.   Electronically Signed   By: Abelardo Diesel M.D.   On: 07/31/2013 09:01   ASSESSMENT: YOLINDA DUERR 63 y.o. female with a history of Iron deficiency anemia secondary to blood loss (chronic) - Plan: CBC with Differential, CBC with Differential, Comprehensive metabolic panel (Cmet) - [facility], Lactate dehydrogenase (LDH) - [facility], Care order/instruction, 0.9 %  sodium chloride infusion, sodium chloride 0.9 % injection 10 mL, heparin lock flush 100 unit/mL, heparin lock flush 100 unit/mL, sodium chloride 0.9 % injection 3 mL, acetaminophen (TYLENOL) tablet 650 mg, diphenhydrAMINE (BENADRYL) capsule 25 mg, Transfuse RBC, Ferritin, Iron and TIBC [facility], CBC with Differential, Care order/instruction, Type and screen, 0.9 %  sodium chloride infusion, Prepare RBC, acetaminophen (TYLENOL) tablet 650 mg, diphenhydrAMINE (BENADRYL) capsule 25 mg, CANCELED: Prepare RBC  Secondary hemochromatosis   PLAN:  1. Iron deficiency anemia secondary to GI lost.  --She reports severe fatigue and her hemoglobin (6.8)  is low today.  We will continue to support this patient as best we can with IV iron and one unit of packed RBCs. She will obtain IV iron today.  We are continuing to check a CBC and ferritin every month. Given his recent low hemoglobin, we will recheck her hemoglobin on 8/28 and 9/04. We will plan to see [name] again in 1 month, at which time we will check CBC, chemistries, and iron studies.   2. Elevated transiminases in the setting of an elevated ferritin likely due to secondary hemochromatosis and chronic hepatitis C.  --We will continue to follow LFTs to establish trend. We prefer to use annual liver MRI and initiate chelation if the liver iron is >3 mg/g dry weight or if the serum ferritin is >1000 ng/mL on two consecutive measurements. Her last MRI of abdomen (11/2011) demonstrated findings consistent with secondary or transfusional hemochromatosis involving  the liver, spleen, adrenal glands and bone marrow. No findings for cirrhosis, portal hypertension, splenomegaly or ascites. No worrisome enhancing liver lesions.  Patient was counseled to avoid hepatatoxins including percocet which has tylenol.   3. Large incisional hernia. --Being watched by surgery.  Surgery delayed due to newly diagnosed diabetes.   4. DM2. --Management per PCP's office.   All questions were answered. The patient knows to call the clinic with any problems, questions or concerns. We can certainly see the patient much sooner if necessary.  I spent 15 minutes counseling the patient face to face. The total time spent in the appointment was 25 minutes.    Karsyn Rochin, MD 01/15/2014 1:43 PM

## 2014-01-15 NOTE — Progress Notes (Signed)
Discussed with Dr. Juliann Mule, if pt. feels the need and able to come in on Saturday or next Monday, we can schedule her for one unit of blood.  Per MD's note, pt. needs to return next Friday for lab and possible blood.  Called pt. at home and she feels comfortable as per  Dr. Juliann Mule discussion with her about plans to return next Friday.  Instructed pt. tocall if needed she will need to be sooner.  HL

## 2014-01-15 NOTE — Patient Instructions (Signed)
Anemia, Nonspecific Anemia is a condition in which the concentration of red blood cells or hemoglobin in the blood is below normal. Hemoglobin is a substance in red blood cells that carries oxygen to the tissues of the body. Anemia results in not enough oxygen reaching these tissues.  CAUSES  Common causes of anemia include:   Excessive bleeding. Bleeding may be internal or external. This includes excessive bleeding from periods (in women) or from the intestine.   Poor nutrition.   Chronic kidney, thyroid, and liver disease.  Bone marrow disorders that decrease red blood cell production.  Cancer and treatments for cancer.  HIV, AIDS, and their treatments.  Spleen problems that increase red blood cell destruction.  Blood disorders.  Excess destruction of red blood cells due to infection, medicines, and autoimmune disorders. SIGNS AND SYMPTOMS   Minor weakness.   Dizziness.   Headache.  Palpitations.   Shortness of breath, especially with exercise.   Paleness.  Cold sensitivity.  Indigestion.  Nausea.  Difficulty sleeping.  Difficulty concentrating. Symptoms may occur suddenly or they may develop slowly.  DIAGNOSIS  Additional blood tests are often needed. These help your health care provider determine the best treatment. Your health care provider will check your stool for blood and look for other causes of blood loss.  TREATMENT  Treatment varies depending on the cause of the anemia. Treatment can include:   Supplements of iron, vitamin F81, or folic acid.   Hormone medicines.   A blood transfusion. This may be needed if blood loss is severe.   Hospitalization. This may be needed if there is significant continual blood loss.   Dietary changes.  Spleen removal. HOME CARE INSTRUCTIONS Keep all follow-up appointments. It often takes many weeks to correct anemia, and having your health care provider check on your condition and your response to  treatment is very important. SEEK IMMEDIATE MEDICAL CARE IF:   You develop extreme weakness, shortness of breath, or chest pain.   You become dizzy or have trouble concentrating.  You develop heavy vaginal bleeding.   You develop a rash.   You have bloody or black, tarry stools.   You faint.   You vomit up blood.   You vomit repeatedly.   You have abdominal pain.  You have a fever or persistent symptoms for more than 2-3 days.   You have a fever and your symptoms suddenly get worse.   You are dehydrated.  MAKE SURE YOU:  Understand these instructions.  Will watch your condition.  Will get help right away if you are not doing well or get worse. Document Released: 06/21/2004 Document Revised: 01/14/2013 Document Reviewed: 11/07/2012 [facility] Patient Information 2015 [place], [place]. This information is not intended to replace advice given to you by your health care provider. Make sure you discuss any questions you have with your health care provider. Blood Transfusion Information WHAT IS A BLOOD TRANSFUSION? A transfusion is the replacement of blood or some of its parts. Blood is made up of multiple cells which provide different functions.  Red blood cells carry oxygen and are used for blood loss replacement.  White blood cells fight against infection.  Platelets control bleeding.  Plasma helps clot blood.  Other blood products are available for specialized needs, such as hemophilia or other clotting disorders. BEFORE THE TRANSFUSION  Who gives blood for transfusions?   You may be able to donate blood to be used at a later date on yourself (autologous donation).  Relatives can be  asked to donate blood. This is generally not any safer than if you have received blood from a stranger. The same precautions are taken to ensure safety when a relative's blood is donated.  Healthy volunteers who are fully evaluated to make sure their blood is safe. This is blood  bank blood. Transfusion therapy is the safest it has ever been in the practice of medicine. Before blood is taken from a donor, a complete history is taken to make sure that person has no history of diseases nor engages in risky social behavior (examples are intravenous drug use or sexual activity with multiple partners). The donor's travel history is screened to minimize risk of transmitting infections, such as malaria. The donated blood is tested for signs of infectious diseases, such as HIV and hepatitis. The blood is then tested to be sure it is compatible with you in order to minimize the chance of a transfusion reaction. If you or a relative donates blood, this is often done in anticipation of surgery and is not appropriate for emergency situations. It takes many days to process the donated blood. RISKS AND COMPLICATIONS Although transfusion therapy is very safe and saves many lives, the main dangers of transfusion include:   Getting an infectious disease.  Developing a transfusion reaction. This is an allergic reaction to something in the blood you were given. Every precaution is taken to prevent this. The decision to have a blood transfusion has been considered carefully by your caregiver before blood is given. Blood is not given unless the benefits outweigh the risks. AFTER THE TRANSFUSION  Right after receiving a blood transfusion, you will usually feel much better and more energetic. This is especially true if your red blood cells have gotten low (anemic). The transfusion raises the level of the red blood cells which carry oxygen, and this usually causes an energy increase.  The nurse administering the transfusion will monitor you carefully for complications. HOME CARE INSTRUCTIONS  No special instructions are needed after a transfusion. You may find your energy is better. Speak with your caregiver about any limitations on activity for underlying diseases you may have. SEEK MEDICAL CARE  IF:   Your condition is not improving after your transfusion.  You develop redness or irritation at the intravenous (IV) site. SEEK IMMEDIATE MEDICAL CARE IF:  Any of the following symptoms occur over the next 12 hours:  Shaking chills.  You have a temperature by mouth above 102 F (38.9 C), not controlled by medicine.  Chest, back, or muscle pain.  People around you feel you are not acting correctly or are confused.  Shortness of breath or difficulty breathing.  Dizziness and fainting.  You get a rash or develop hives.  You have a decrease in urine output.  Your urine turns a dark color or changes to pink, red, or brown. Any of the following symptoms occur over the next 10 days:  You have a temperature by mouth above 102 F (38.9 C), not controlled by medicine.  Shortness of breath.  Weakness after normal activity.  The white part of the eye turns yellow (jaundice).  You have a decrease in the amount of urine or are urinating less often.  Your urine turns a dark color or changes to pink, red, or brown. Document Released: 05/11/2000 Document Revised: 08/06/2011 Document Reviewed: 12/29/2007 [facility] Patient Information 2015 [place], [place]. This information is not intended to replace advice given to you by your health care provider. Make sure you discuss any questions  you have with your health care provider. Ferumoxytol injection What is this medicine? FERUMOXYTOL is an iron complex. Iron is used to make healthy red blood cells, which carry oxygen and nutrients throughout the body. This medicine is used to treat iron deficiency anemia in people with chronic kidney disease. This medicine may be used for other purposes; ask your health care provider or pharmacist if you have questions. COMMON BRAND NAME(S): Feraheme What should I tell my health care provider before I take this medicine? They need to know if you have any of these conditions: -anemia not caused by low  iron levels -high levels of iron in the blood -magnetic resonance imaging (MRI) test scheduled -an unusual or allergic reaction to iron, other medicines, foods, dyes, or preservatives -pregnant or trying to get pregnant -breast-feeding How should I use this medicine? This medicine is for injection into a vein. It is given by a health care professional in a hospital or clinic setting. Talk to your pediatrician regarding the use of this medicine in children. Special care may be needed. Overdosage: If you think you've taken too much of this medicine contact a poison control center or emergency room at once. Overdosage: If you think you have taken too much of this medicine contact a poison control center or emergency room at once. NOTE: This medicine is only for you. Do not share this medicine with others. What if I miss a dose? It is important not to miss your dose. Call your doctor or health care professional if you are unable to keep an appointment. What may interact with this medicine? This medicine may interact with the following medications: -other iron products This list may not describe all possible interactions. Give your health care provider a list of all the medicines, herbs, non-prescription drugs, or dietary supplements you use. Also tell them if you smoke, drink alcohol, or use illegal drugs. Some items may interact with your medicine. What should I watch for while using this medicine? Visit your doctor or healthcare professional regularly. Tell your doctor or healthcare professional if your symptoms do not start to get better or if they get worse. You may need blood work done while you are taking this medicine. You may need to follow a special diet. Talk to your doctor. Foods that contain iron include: whole grains/cereals, dried fruits, beans, or peas, leafy green vegetables, and organ meats (liver, kidney). What side effects may I notice from receiving this medicine? Side effects  that you should report to your doctor or health care professional as soon as possible: -allergic reactions like skin rash, itching or hives, swelling of the face, lips, or tongue -breathing problems -changes in blood pressure -feeling faint or lightheaded, falls -fever or chills -flushing, sweating, or hot feelings -swelling of the ankles or feet Side effects that usually do not require medical attention (Report these to your doctor or health care professional if they continue or are bothersome.): -diarrhea -headache -nausea, vomiting -stomach pain This list may not describe all possible side effects. Call your doctor for medical advice about side effects. You may report side effects to FDA at 1-800-FDA-1088. Where should I keep my medicine? This drug is given in a hospital or clinic and will not be stored at home. NOTE: This sheet is a summary. It may not cover all possible information. If you have questions about this medicine, talk to your doctor, pharmacist, or health care provider.  2015, [employer]. (2011-12-28 15:23:36)

## 2014-01-15 NOTE — Telephone Encounter (Signed)
gv and printed appt schedand avs for pt for Sept °

## 2014-01-16 LAB — TYPE AND SCREEN
ABO/RH(D): AB POS
ANTIBODY SCREEN: NEGATIVE
Unit division: 0

## 2014-01-18 ENCOUNTER — Other Ambulatory Visit: Payer: Self-pay | Admitting: Medical Oncology

## 2014-01-18 ENCOUNTER — Telehealth: Payer: Self-pay | Admitting: *Deleted

## 2014-01-18 DIAGNOSIS — D500 Iron deficiency anemia secondary to blood loss (chronic): Secondary | ICD-10-CM

## 2014-01-18 NOTE — Telephone Encounter (Signed)
Scheduled patient's next blood infusion from Friday to this Friday,. DEsk RN notified. JW

## 2014-01-19 ENCOUNTER — Ambulatory Visit (INDEPENDENT_AMBULATORY_CARE_PROVIDER_SITE_OTHER): Payer: Medicare Other | Admitting: Surgery

## 2014-01-19 VITALS — BP 120/76 | HR 77 | Temp 98.6°F | Ht 63.0 in

## 2014-01-19 DIAGNOSIS — K432 Incisional hernia without obstruction or gangrene: Secondary | ICD-10-CM | POA: Diagnosis not present

## 2014-01-19 MED ORDER — TRAMADOL HCL 50 MG PO TABS: 50.0000 mg | ORAL_TABLET | Freq: Four times a day (QID) | ORAL | Status: DC | PRN

## 2014-01-19 NOTE — Addendum Note (Signed)
Addended by: Coralie Keens A on: 01/19/2014 04:37 PM   Modules accepted: Orders

## 2014-01-19 NOTE — Patient Instructions (Signed)
When you have clearance from her medical physicians, we will schedule surgery.

## 2014-01-19 NOTE — Progress Notes (Signed)
Subjective:     Patient ID: [name], female   DOB: [birth year], 63 y.o.   MRN: [id number]  HPI She is here for another evaluation regarding her incisional hernia. She is still requiring transfusions. She still has chronic abdominal discomfort from the hernia but no obstructive symptoms  Review of Systems     Objective:   Physical Exam On examination, she has a large easily reducible incisional hernia    Assessment:     Incisional hernia     Plan:     Laparoscopic repair with mesh was recommended. She is newly diagnosed diabetic and is still receiving transfusions. As soon as her medical physicians feel that she is ready, I will schedule her for surgery.

## 2014-01-20 ENCOUNTER — Telehealth: Payer: Self-pay | Admitting: Internal Medicine

## 2014-01-20 NOTE — Telephone Encounter (Signed)
PT ALREADY ON SCHEDULE FOR BLOOD 8/28. NO OTHER ODERS SEE PREVIOUS NOTE RE APPT,

## 2014-01-21 ENCOUNTER — Other Ambulatory Visit: Payer: Self-pay | Admitting: *Deleted

## 2014-01-21 DIAGNOSIS — T8189XS Other complications of procedures, not elsewhere classified, sequela: Secondary | ICD-10-CM

## 2014-01-21 MED ORDER — OXYCODONE-ACETAMINOPHEN 10-325 MG PO TABS: 1.0000 | ORAL_TABLET | Freq: Four times a day (QID) | ORAL | Status: DC | PRN

## 2014-01-22 ENCOUNTER — Ambulatory Visit: Payer: Medicare Other

## 2014-01-22 ENCOUNTER — Other Ambulatory Visit (HOSPITAL_BASED_OUTPATIENT_CLINIC_OR_DEPARTMENT_OTHER): Payer: Medicare Other

## 2014-01-22 DIAGNOSIS — D500 Iron deficiency anemia secondary to blood loss (chronic): Secondary | ICD-10-CM

## 2014-01-22 LAB — CBC WITH DIFFERENTIAL/PLATELET
BASO%: 0.2 % (ref 0.0–2.0)
Basophils Absolute: 0 10*3/uL (ref 0.0–0.1)
EOS%: 1.4 % (ref 0.0–7.0)
Eosinophils Absolute: 0.1 10*3/uL (ref 0.0–0.5)
HCT: 29.8 % — ABNORMAL LOW (ref 34.8–46.6)
HGB: 9.1 g/dL — ABNORMAL LOW (ref 11.6–15.9)
LYMPH%: 20.6 % (ref 14.0–49.7)
MCH: 25.7 pg (ref 25.1–34.0)
MCHC: 30.5 g/dL — ABNORMAL LOW (ref 31.5–36.0)
MCV: 84.2 fL (ref 79.5–101.0)
MONO#: 0.8 10*3/uL (ref 0.1–0.9)
MONO%: 12.6 % (ref 0.0–14.0)
NEUT%: 65.2 % (ref 38.4–76.8)
NEUTROS ABS: 4.2 10*3/uL (ref 1.5–6.5)
Platelets: 293 10*3/uL (ref 145–400)
RBC: 3.54 10*6/uL — ABNORMAL LOW (ref 3.70–5.45)
RDW: 23.4 % — AB (ref 11.2–14.5)
WBC: 6.4 10*3/uL (ref 3.9–10.3)
lymph#: 1.3 10*3/uL (ref 0.9–3.3)

## 2014-01-22 LAB — HOLD TUBE, BLOOD BANK

## 2014-01-22 NOTE — Progress Notes (Signed)
Patient's hgb 9.1 today. Patient does not require transfusion per Dr. Juliann Mule. Patient's schedule printed.

## 2014-01-29 ENCOUNTER — Other Ambulatory Visit (HOSPITAL_BASED_OUTPATIENT_CLINIC_OR_DEPARTMENT_OTHER): Payer: Medicare Other

## 2014-01-29 DIAGNOSIS — D500 Iron deficiency anemia secondary to blood loss (chronic): Secondary | ICD-10-CM | POA: Diagnosis not present

## 2014-01-29 LAB — CBC WITH DIFFERENTIAL/PLATELET
BASO%: 0.1 % (ref 0.0–2.0)
Basophils Absolute: 0 10*3/uL (ref 0.0–0.1)
EOS ABS: 0.1 10*3/uL (ref 0.0–0.5)
EOS%: 1.4 % (ref 0.0–7.0)
HCT: 31.3 % — ABNORMAL LOW (ref 34.8–46.6)
HEMOGLOBIN: 9.5 g/dL — AB (ref 11.6–15.9)
LYMPH%: 23.1 % (ref 14.0–49.7)
MCH: 26.8 pg (ref 25.1–34.0)
MCHC: 30.4 g/dL — ABNORMAL LOW (ref 31.5–36.0)
MCV: 88.4 fL (ref 79.5–101.0)
MONO#: 0.9 10*3/uL (ref 0.1–0.9)
MONO%: 13.4 % (ref 0.0–14.0)
NEUT%: 62 % (ref 38.4–76.8)
NEUTROS ABS: 4.3 10*3/uL (ref 1.5–6.5)
PLATELETS: 253 10*3/uL (ref 145–400)
RBC: 3.54 10*6/uL — AB (ref 3.70–5.45)
RDW: 25 % — ABNORMAL HIGH (ref 11.2–14.5)
WBC: 7 10*3/uL (ref 3.9–10.3)
lymph#: 1.6 10*3/uL (ref 0.9–3.3)

## 2014-01-29 LAB — IRON AND TIBC CHCC
%SAT: 19 % — AB (ref 21–57)
Iron: 73 ug/dL (ref 41–142)
TIBC: 368 ug/dL (ref 236–444)
UIBC: 298 ug/dL (ref 120–384)

## 2014-01-29 LAB — FERRITIN CHCC: FERRITIN: 417 ng/mL — AB (ref 9–269)

## 2014-02-12 ENCOUNTER — Encounter: Payer: Medicare Other | Admitting: Internal Medicine

## 2014-02-12 ENCOUNTER — Other Ambulatory Visit: Payer: Medicare Other

## 2014-02-12 ENCOUNTER — Ambulatory Visit: Payer: Medicare Other

## 2014-02-18 ENCOUNTER — Telehealth: Payer: Self-pay | Admitting: Hematology

## 2014-02-18 ENCOUNTER — Other Ambulatory Visit (HOSPITAL_BASED_OUTPATIENT_CLINIC_OR_DEPARTMENT_OTHER): Payer: Medicare Other

## 2014-02-18 ENCOUNTER — Encounter: Payer: Self-pay | Admitting: Hematology

## 2014-02-18 ENCOUNTER — Ambulatory Visit (HOSPITAL_BASED_OUTPATIENT_CLINIC_OR_DEPARTMENT_OTHER): Payer: Medicare Other | Admitting: Hematology

## 2014-02-18 VITALS — BP 119/75 | HR 79 | Temp 98.9°F | Resp 18 | Ht 63.0 in | Wt 173.1 lb

## 2014-02-18 DIAGNOSIS — K432 Incisional hernia without obstruction or gangrene: Secondary | ICD-10-CM | POA: Diagnosis not present

## 2014-02-18 DIAGNOSIS — R5381 Other malaise: Secondary | ICD-10-CM

## 2014-02-18 DIAGNOSIS — R5383 Other fatigue: Secondary | ICD-10-CM | POA: Diagnosis not present

## 2014-02-18 DIAGNOSIS — D500 Iron deficiency anemia secondary to blood loss (chronic): Secondary | ICD-10-CM

## 2014-02-18 DIAGNOSIS — E119 Type 2 diabetes mellitus without complications: Secondary | ICD-10-CM

## 2014-02-18 DIAGNOSIS — R740 Nonspecific elevation of levels of transaminase and lactic acid dehydrogenase [LDH]: Secondary | ICD-10-CM

## 2014-02-18 DIAGNOSIS — R7401 Elevation of levels of liver transaminase levels: Secondary | ICD-10-CM | POA: Diagnosis not present

## 2014-02-18 DIAGNOSIS — K439 Ventral hernia without obstruction or gangrene: Secondary | ICD-10-CM

## 2014-02-18 DIAGNOSIS — R7402 Elevation of levels of lactic acid dehydrogenase (LDH): Secondary | ICD-10-CM | POA: Diagnosis not present

## 2014-02-18 LAB — COMPREHENSIVE METABOLIC PANEL (CC13)
ALBUMIN: 3.3 g/dL — AB (ref 3.5–5.0)
ALT: 87 U/L — ABNORMAL HIGH (ref 0–55)
AST: 117 U/L — AB (ref 5–34)
Alkaline Phosphatase: 89 U/L (ref 40–150)
Anion Gap: 9 mEq/L (ref 3–11)
BUN: 10.2 mg/dL (ref 7.0–26.0)
CALCIUM: 8.9 mg/dL (ref 8.4–10.4)
CO2: 26 mEq/L (ref 22–29)
Chloride: 102 mEq/L (ref 98–109)
Creatinine: 0.8 mg/dL (ref 0.6–1.1)
Glucose: 108 mg/dl (ref 70–140)
POTASSIUM: 4.1 meq/L (ref 3.5–5.1)
Sodium: 137 mEq/L (ref 136–145)
TOTAL PROTEIN: 7 g/dL (ref 6.4–8.3)
Total Bilirubin: 0.36 mg/dL (ref 0.20–1.20)

## 2014-02-18 LAB — CBC WITH DIFFERENTIAL/PLATELET
BASO%: 0.5 % (ref 0.0–2.0)
Basophils Absolute: 0 10*3/uL (ref 0.0–0.1)
EOS%: 0.7 % (ref 0.0–7.0)
Eosinophils Absolute: 0 10*3/uL (ref 0.0–0.5)
HCT: 31.7 % — ABNORMAL LOW (ref 34.8–46.6)
HGB: 10 g/dL — ABNORMAL LOW (ref 11.6–15.9)
LYMPH#: 1.1 10*3/uL (ref 0.9–3.3)
LYMPH%: 21 % (ref 14.0–49.7)
MCH: 29 pg (ref 25.1–34.0)
MCHC: 31.7 g/dL (ref 31.5–36.0)
MCV: 91.5 fL (ref 79.5–101.0)
MONO#: 0.8 10*3/uL (ref 0.1–0.9)
MONO%: 16.5 % — ABNORMAL HIGH (ref 0.0–14.0)
NEUT#: 3.1 10*3/uL (ref 1.5–6.5)
NEUT%: 61.3 % (ref 38.4–76.8)
Platelets: 261 10*3/uL (ref 145–400)
RBC: 3.46 10*6/uL — ABNORMAL LOW (ref 3.70–5.45)
RDW: 23.6 % — AB (ref 11.2–14.5)
WBC: 5.1 10*3/uL (ref 3.9–10.3)

## 2014-02-18 LAB — LACTATE DEHYDROGENASE (CC13): LDH: 157 U/L (ref 125–245)

## 2014-02-18 NOTE — Telephone Encounter (Signed)
Pt confirmed labs/ov per 09/24 POF,  gave pt AVS         KJ °

## 2014-02-18 NOTE — Progress Notes (Signed)
[facility]ATOLOGY OFFICE PROGRESS NOTE 02/18/2014  Patricia Gallant, MD [place] [id number]  DIAGNOSIS: Iron deficiency anemia secondary to blood loss (chronic) - Plan: CBC with Differential, Hold Tube, Blood Bank, Ferritin, Iron and TIBC [facility], Basic metabolic panel (Bmet) - [facility]. Her last visit was with Dr Juliann Mule on 01/15/2014.  Chief Complaint  Patient presents with  . Follow-up   PRIOR TREATMENT: The patient has been heavily dependent upon IV Feraheme. Over the past year or so she has been receiving about 1 Feraheme infusion monthly on average. The patient's most recent infusions of IV Feraheme 510 mg occurred on the following dates: 06/05/2011, 07/03/2011, 07/31/2011, 08/17/2011, 08/28/2011. The patient received IV Feraheme 1020 mg on 10/02/2011 01/30/2012, 06/06/2012 and 09/10/2012, 05/2013, 07/2013, 08/2013, 09/2013, 11/12/2013, 01/15/2014.  CURRENT TREATMENT: As noted above. Feraheme 1020 mg prn.   INTERVAL HISTORY:  [name] 63 y.o. female with history of recurrent iron-deficiency anemia due to GI bleeding is here for follow-up.  She was last seen by me on 10/01/2013. GI bleeding is probably due to AV malformations and diverticulosis.  We have been following her CBC and ferritin on a monthly basis except for the time when she was hospitalized. Since August 2013, the patient required IV Feraheme 1020 as noted above.   Today, the patient is here in a wheelchair but gets around at home with a walker. She lives alone. She reports severe fatigue but denies chest pain.  She also notes low energy.  She denies any hematochezia or melana. She has undergone a left above-the-knee amputation due to necrotizing fasciitis in October 2010. Her abdominal wound has healed but she still has a large hernia but surgery will need to be delayed due to newly diabetes.  She wants Dr Ninfa Linden to operate her hernia. From our side she can undergo surgery but will benefit from transfusion  of 1 unit perioperatively. If we know date of surgery, we can transfuse her 1 unit here at cancer center 2-3 days prior to surgery. She is not describing any GI bleeding, fatigue etc. Her labs were done today.    MEDICAL HISTORY: Past Medical History  Diagnosis Date  . Hypertension   . Colon polyps   . Stomach problems   . Hypokalemic alkalosis   . Allergic rhinitis   . GERD (gastroesophageal reflux disease)   . Stroke 2010    no residual problems  . Anemia, iron deficiency 05/03/2011    recieves iron infusions  . Anemia   . GI bleeding   . Diverticulitis   . Transfusion history   . Difficulty sleeping     takes trazadone for sleep  . Peripheral vascular disease   . Hepatitis     hep C  . Anxiety   . Shortness of breath     due to anemia   PROBLEM LIST:  1. Recurrent iron-deficiency anemia secondary to gastrointestinal bleeding (as noted above). Ms. Petruska also has required red cell transfusions in the past, most recently early December 2012. She underwent a small bowel enteroscopy by Dr. Dan Humphreys at [facility] on 08/13/2011. The enteroscope was passed up to 6 feet into the jejunum. He saw multiple AVMs, which were ablated. Two of the large AVMs bled during APC. These were in the 4th part of the duodenum very close to the ligament of Treitz. The remainder of the AVMs were in the proximal jejunum. In addition to the small bowel  AV malformations, which were treated with APC, he saw some small esophageal varices which were not bleeding and also evidence of mild portal hypertensive gastropathy.  2. History of duodenal arteriovenous malformation.  3. History of hepatitis C infection.  4. Gastroesophageal reflux disease.  5. Depression.  6. Hypertension.  7. History of right Bell's palsy.  8. Status post left above-knee amputation secondary to necrotizing fasciitis 03/06/2009.  9. Neurodermatitis.  10. Previous history of alcohol usage.    11. Admission to the hospital from 12/20/2011 through 12/25/2011 for hematochezia felt to be secondary to a diverticular bleed, requiring 2 units of packed red cells.  12. Diverticulosis.  13. Elevated alpha fetoprotein, 72.3 on 11/19/2011 and 76.0 on 01/11/2012.  14. Abnormal MRI of the liver from 11/28/2011 showing diffuse and markedly low signal intensity throughout the liver as well as diffuse low signal intensity in the adrenal glands. 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 There were no findings for cirrhosis, portal hypertension, splenomegaly or ascites, not were there any worrisome enhancing liver lesions.  15. Systolic ejection murmur.   INTERIM HISTORY: has Essential hypertension, benign; GERD; Necrotizing fasciitis; CARPAL TUNNEL RELEASE, HX OF; Iron deficiency anemia secondary to blood loss (chronic); Chronic hepatitis C without mention of hepatic coma; Ventral hernia; Phantom limb pain; Incisional hernia, without obstruction or gangrene; Secondary hemochromatosis; Type II or unspecified type diabetes mellitus with unspecified complication, uncontrolled; Depression; and Non-healing surgical wound on her problem list.    ALLERGIES:  is allergic to ciprofloxacin; morphine and related; penicillins; codeine; and feraheme.  MEDICATIONS: has a current medication list which includes the following prescription(s): alrex, aspirin ec, atenolol, cetirizine, cyclobenzaprine, esomeprazole, fluorometholone, fluoxetine, gabapentin, hydrochlorothiazide, lactose free nutrition, lisinopril, metformin, oxycodone-acetaminophen, potassium chloride, prednisolone acetate, magnesium (amino acid chelate), tramadol, trazodone, and vitamin c.  SURGICAL HISTORY:  Past Surgical History  Procedure Laterality Date  . Leg amputation above knee    . Esophagogastroduodenoscopy  05/05/2011    Procedure: ESOPHAGOGASTRODUODENOSCOPY (EGD);  Surgeon: Zenovia Jarred, MD;  Location: [facility];  Service: Gastroenterology;  Laterality: N/A;  Dr. Hilarie Fredrickson will do procedure for Dr. Benson Norway Saturday.  . Esophagogastroduodenoscopy  05/08/2011    Procedure: ESOPHAGOGASTRODUODENOSCOPY (EGD);  Surgeon: Beryle Beams;  Location: [facility] ENDOSCOPY;  Service: Endoscopy;  Laterality: N/A;  . Esophagogastroduodenoscopy  06/07/2011    Procedure: ESOPHAGOGASTRODUODENOSCOPY (EGD);  Surgeon: Beryle Beams, MD;  Location: [facility];  Service: Endoscopy;  Laterality: N/A;  . Hot hemostasis  06/07/2011    Procedure: HOT HEMOSTASIS (ARGON PLASMA COAGULATION/BICAP);  Surgeon: Beryle Beams, MD;  Location: [facility];  Service: Endoscopy;  Laterality: N/A;  . Esophagogastroduodenoscopy  12/20/2011    Procedure: ESOPHAGOGASTRODUODENOSCOPY (EGD);  Surgeon: Beryle Beams, MD;  Location: [facility];  Service: Endoscopy;  Laterality: N/A;  . Flexible sigmoidoscopy  12/21/2011    Procedure: FLEXIBLE SIGMOIDOSCOPY;  Surgeon: Beryle Beams, MD;  Location: [facility];  Service: Endoscopy;  Laterality: N/A;  . Carpal tunnel release      rt hand  . Partial colectomy  02/15/2012    Procedure: PARTIAL COLECTOMY;  Surgeon: Harl Bowie, MD;  Location: [facility];  Service: General;  Laterality: N/A;  . Laparotomy  02/16/2012    Procedure: EXPLORATORY LAPAROTOMY;  Surgeon: Edward Jolly, MD;  Location: [facility];  Service: General;  Laterality: N/A;  oversewing of anastomotic leak and rigid sigmoidoscopy  . Enteroscopy N/A 12/04/2012    Procedure: ENTEROSCOPY;  Surgeon: Beryle Beams, MD;  Location: [facility];  Service: Endoscopy;  Laterality: N/A;  . Enteroscopy N/A 12/18/2012    Procedure: ENTEROSCOPY;  Surgeon: Beryle Beams, MD;  Location: [facility];  Service: Endoscopy;  Laterality: N/A;  . Eye surgery Bilateral     cataracts    REVIEW OF SYSTEMS:   Constitutional: Denies fevers, chills or abnormal weight loss; Reports mild fatigue.  Eyes: Denies blurriness of vision Ears, nose, mouth,  throat, and face: Denies mucositis or sore throat Respiratory: Denies cough, dyspnea or wheezes Cardiovascular: Denies palpitation, chest discomfort or lower extremity swelling Gastrointestinal:  Denies nausea, heartburn or change in bowel habits, hernia is painful and bleeds sometime the overlying skin. Skin: Denies abnormal skin rashes Lymphatics: Denies new lymphadenopathy or easy bruising Neurological:Denies numbness, tingling or new weaknesses Behavioral/Psych: Mood is stable, no new changes  All other systems were reviewed with the patient and are negative.  PHYSICAL EXAMINATION: ECOG PERFORMANCE STATUS: 2-3  Blood pressure 119/75, pulse 79, temperature 98.9 F (37.2 C), temperature source Oral, resp. rate 18, height 5\' 3"  (1.6 m), weight 173 lb 1.6 oz (78.518 kg).  General appearance: alert, appears stated age, no distress and moderately obese  Head: Normocephalic, without obvious abnormality, atraumatic  Neck: no adenopathy, supple, symmetrical, trachea midline and thyroid not enlarged, symmetric, no tenderness/mass/nodules  Lymph nodes: Cervical adenopathy: None appreciated  Heart:regular rate and rhythm, S1, S2 normal and systolic murmur: systolic ejection 2/6, harsh and previously documented without radiation  Lung:chest clear, no wheezing, rales, normal symmetric air entry, Heart exam - S1, S2 normal, no murmur, no gallop, rate regular  Abdomin: soft, distended, normal bowel sounds, nontender and incisional hernia and easily reducible and nontender  EXT:No peripheral edema on right extremity; Left above the knee amputation. She is wearing a prothesis.   Labs:            RADIOGRAPHIC STUDIES: Mm Digital Screening Bilateral  07/31/2013   CLINICAL DATA:  Screening.  EXAM: DIGITAL SCREENING BILATERAL MAMMOGRAM WITH CAD  COMPARISON:  Previous exam(s).  ACR Breast Density Category c: The breast tissue is heterogeneously dense, which may obscure small masses.  FINDINGS:  There are no findings suspicious for malignancy. Images were processed with CAD.  IMPRESSION: No mammographic evidence of malignancy. A result letter of this screening mammogram will be mailed directly to the patient.  RECOMMENDATION: Screening mammogram in one year. (Code:SM-B-01Y)  BI-RADS CATEGORY  1: Negative.   Electronically Signed   By: Abelardo Diesel M.D.   On: 07/31/2013 09:01   ASSESSMENT: WAYNE BRUNKER 63 y.o. female with a history of Iron deficiency anemia secondary to blood loss (chronic) - Plan: CBC with Differential, Hold Tube, Blood Bank, Ferritin, Iron and TIBC [facility], Basic metabolic panel (Bmet) - [facility]   PLAN:  1. Iron deficiency anemia secondary to GI lost.  --She does not report fatigue and her hemoglobin (10.0)  is adequate today.  We will continue to support this patient as best we can with IV iron and packed RBCs as needed.  We are continuing to check a CBC and ferritin every month.  --RTC with repeat labs on 03/11/2014  2. Elevated transiminases in the setting of an elevated ferritin likely due to secondary hemochromatosis and chronic hepatitis C.  --We will continue to follow LFTs to establish trend. We prefer to use annual liver MRI and initiate chelation if the liver iron is >3 mg/g dry weight or if the serum ferritin is >1000 ng/mL on two consecutive measurements. Her last MRI of abdomen (11/2011) demonstrated findings  consistent with secondary or transfusional hemochromatosis involving the liver, spleen, adrenal glands and bone marrow. No findings for cirrhosis, portal hypertension, splenomegaly or ascites. No worrisome enhancing liver lesions.  Patient was counseled to avoid hepatatoxins including percocet which has tylenol. Transaminases are stable today and other 3 liver tests are normal.   3. Large incisional hernia. --Being watched by surgery.  Surgery delayed due to newly diagnosed diabetes. We are clearing for surgery. PCP needs to give clearance as well.   4.  DM2. --Management per PCP's office.   All questions were answered. The patient knows to call the clinic with any problems, questions or concerns. We can certainly see the patient much sooner if necessary.  I spent 20 minutes counseling the patient face to face. The total time spent in the appointment was 25 minutes.    Bernadene Bell, MD Medical Hematologist/Oncologist [place] Pager: [phone] Office No: [phone]

## 2014-02-18 NOTE — Telephone Encounter (Addendum)
Pt r/s labs/ov per tech from registration, pt was sick mailed out new schedule for reminder, and pt was aware of date per register and schedule was given to pt........ This was done on 09/18 forgot to enter in the chart.Marland Kitchen..KJ

## 2014-02-23 ENCOUNTER — Other Ambulatory Visit: Payer: Self-pay | Admitting: *Deleted

## 2014-02-23 DIAGNOSIS — T8189XS Other complications of procedures, not elsewhere classified, sequela: Secondary | ICD-10-CM

## 2014-02-23 NOTE — Telephone Encounter (Signed)
Last refill 8/31 Call when ready @ [phone]

## 2014-02-25 ENCOUNTER — Other Ambulatory Visit: Payer: Self-pay | Admitting: Internal Medicine

## 2014-02-25 DIAGNOSIS — T8189XS Other complications of procedures, not elsewhere classified, sequela: Secondary | ICD-10-CM

## 2014-02-25 MED ORDER — GLUCOSE BLOOD VI STRP: ORAL_STRIP | Status: DC

## 2014-02-25 MED ORDER — OXYCODONE-ACETAMINOPHEN 10-325 MG PO TABS: 1.0000 | ORAL_TABLET | Freq: Four times a day (QID) | ORAL | Status: DC | PRN

## 2014-02-26 NOTE — Telephone Encounter (Signed)
Pt informed Rx is ready 

## 2014-03-09 ENCOUNTER — Other Ambulatory Visit: Payer: Self-pay | Admitting: Internal Medicine

## 2014-03-11 ENCOUNTER — Telehealth: Payer: Self-pay | Admitting: Hematology

## 2014-03-11 NOTE — Telephone Encounter (Signed)
S/w pt confirmed labs/ov r/s 10/16 per provider schedule..... KJ

## 2014-03-12 ENCOUNTER — Other Ambulatory Visit: Payer: Medicare Other

## 2014-03-12 ENCOUNTER — Ambulatory Visit: Payer: Medicare Other

## 2014-03-15 ENCOUNTER — Other Ambulatory Visit: Payer: Self-pay | Admitting: Hematology

## 2014-03-15 ENCOUNTER — Other Ambulatory Visit (HOSPITAL_BASED_OUTPATIENT_CLINIC_OR_DEPARTMENT_OTHER): Payer: Medicare Other

## 2014-03-15 ENCOUNTER — Encounter: Payer: Self-pay | Admitting: Nutrition

## 2014-03-15 ENCOUNTER — Encounter: Payer: Self-pay | Admitting: Physician Assistant

## 2014-03-15 ENCOUNTER — Ambulatory Visit (HOSPITAL_BASED_OUTPATIENT_CLINIC_OR_DEPARTMENT_OTHER): Payer: Medicare Other | Admitting: Physician Assistant

## 2014-03-15 ENCOUNTER — Telehealth: Payer: Self-pay | Admitting: Physician Assistant

## 2014-03-15 ENCOUNTER — Telehealth: Payer: Self-pay | Admitting: *Deleted

## 2014-03-15 ENCOUNTER — Ambulatory Visit (HOSPITAL_COMMUNITY)
Admission: RE | Admit: 2014-03-15 | Discharge: 2014-03-15 | Disposition: A | Discharge status: Home / Self Care | Payer: Medicare Other | Source: Ambulatory Visit | Attending: Internal Medicine | Admitting: Internal Medicine

## 2014-03-15 VITALS — BP 128/57 | HR 82 | Temp 98.5°F | Resp 18 | Ht 63.0 in | Wt 182.5 lb

## 2014-03-15 DIAGNOSIS — K432 Incisional hernia without obstruction or gangrene: Secondary | ICD-10-CM

## 2014-03-15 DIAGNOSIS — D500 Iron deficiency anemia secondary to blood loss (chronic): Secondary | ICD-10-CM | POA: Diagnosis not present

## 2014-03-15 DIAGNOSIS — R740 Nonspecific elevation of levels of transaminase and lactic acid dehydrogenase [LDH]: Secondary | ICD-10-CM

## 2014-03-15 DIAGNOSIS — E119 Type 2 diabetes mellitus without complications: Secondary | ICD-10-CM

## 2014-03-15 LAB — CBC WITH DIFFERENTIAL/PLATELET
BASO%: 0.3 % (ref 0.0–2.0)
Basophils Absolute: 0 10*3/uL (ref 0.0–0.1)
EOS%: 1.4 % (ref 0.0–7.0)
Eosinophils Absolute: 0.1 10*3/uL (ref 0.0–0.5)
HCT: 24.2 % — ABNORMAL LOW (ref 34.8–46.6)
HGB: 7.4 g/dL — ABNORMAL LOW (ref 11.6–15.9)
LYMPH%: 27.1 % (ref 14.0–49.7)
MCH: 25.7 pg (ref 25.1–34.0)
MCHC: 30.6 g/dL — AB (ref 31.5–36.0)
MCV: 84 fL (ref 79.5–101.0)
MONO#: 0.6 10*3/uL (ref 0.1–0.9)
MONO%: 15.6 % — AB (ref 0.0–14.0)
NEUT#: 2 10*3/uL (ref 1.5–6.5)
NEUT%: 55.6 % (ref 38.4–76.8)
Platelets: 274 10*3/uL (ref 145–400)
RBC: 2.88 10*6/uL — AB (ref 3.70–5.45)
RDW: 19.1 % — ABNORMAL HIGH (ref 11.2–14.5)
WBC: 3.6 10*3/uL — ABNORMAL LOW (ref 3.9–10.3)
lymph#: 1 10*3/uL (ref 0.9–3.3)

## 2014-03-15 LAB — BASIC METABOLIC PANEL (CC13)
Anion Gap: 7 mEq/L (ref 3–11)
BUN: 18.7 mg/dL (ref 7.0–26.0)
CO2: 23 mEq/L (ref 22–29)
Calcium: 8.8 mg/dL (ref 8.4–10.4)
Chloride: 105 mEq/L (ref 98–109)
Creatinine: 1.1 mg/dL (ref 0.6–1.1)
Glucose: 150 mg/dl — ABNORMAL HIGH (ref 70–140)
POTASSIUM: 4.6 meq/L (ref 3.5–5.1)
SODIUM: 136 meq/L (ref 136–145)

## 2014-03-15 LAB — IRON AND TIBC CHCC
%SAT: 3 % — ABNORMAL LOW (ref 21–57)
Iron: 15 ug/dL — ABNORMAL LOW (ref 41–142)
TIBC: 479 ug/dL — ABNORMAL HIGH (ref 236–444)
UIBC: 464 ug/dL — AB (ref 120–384)

## 2014-03-15 LAB — HOLD TUBE, BLOOD BANK

## 2014-03-15 LAB — PREPARE RBC (CROSSMATCH)

## 2014-03-15 LAB — FERRITIN CHCC: Ferritin: 15 ng/ml (ref 9–269)

## 2014-03-15 NOTE — Telephone Encounter (Signed)
Pt confirmed units/IVF per 10/19 POF, sent to chemo to add, gave pt AVS......Marland Kitchen

## 2014-03-15 NOTE — Progress Notes (Signed)
Received a call from staff.  The patient is requesting Ensure Plus samples. Patient sees an RD, CDE at [facility] for diabetes education. Patient is being seen at the [facility] secondary to iron deficiency anemia. Weight is stable. Provided patient with 3 bottles of Glucerna as samples along with coupons for purchase.

## 2014-03-15 NOTE — Telephone Encounter (Signed)
Per staff message and POF I have scheduled appts. Advised scheduler of appts. JMW  

## 2014-03-15 NOTE — Patient Instructions (Signed)
Return for IV iron and blood transfusions as scheduled Follow up in 1 month

## 2014-03-15 NOTE — Progress Notes (Signed)
[facility]AT[facility]Y OFFICE PROGRESS NOTE 02/18/2014  [name], MD [place] [address]  DIAGNOSIS: Iron deficiency anemia due to chronic blood loss - Plan: Care order/instruction, 0.9 %  sodium chloride infusion, sodium chloride 0.9 % injection 10 mL, heparin lock flush 100 unit/mL, heparin lock flush 100 unit/mL, sodium chloride 0.9 % injection 3 mL, Practitioner attestation of consent, Type and screen, acetaminophen (TYLENOL) tablet 650 mg, diphenhydrAMINE (BENADRYL) capsule 25 mg, CANCELED: Prepare RBC. Her last visit was with Dr Juliann Mule on 01/15/2014.  No chief complaint on file.  PRIOR TREATMENT: The patient has been heavily dependent upon IV Feraheme. Over the past year or so she has been receiving about 1 Feraheme infusion monthly on average. The patient's most recent infusions of IV Feraheme 510 mg occurred on the following dates: 06/05/2011, 07/03/2011, 07/31/2011, 08/17/2011, 08/28/2011. The patient received IV Feraheme 1020 mg on 10/02/2011 01/30/2012, 06/06/2012 and 09/10/2012, 05/2013, 07/2013, 08/2013, 09/2013, 11/12/2013, 01/15/2014.  CURRENT TREATMENT: As noted above. Feraheme 1020 mg prn.   INTERVAL HISTORY:  [name] 63 y.o. female with history of recurrent iron-deficiency anemia due to GI bleeding is here for follow-up.  She was last seen by Dr. Lona Kettle on 02/18/2014. GI bleeding is probably due to AV malformations and diverticulosis.  We have been following her CBC and ferritin on a monthly basis except for the time when she was hospitalized. Since August 2013, the patient required IV Feraheme 1020 as noted above. She last received fair he might 21 2015.  Today, the patient is here in a wheelchair but gets around at home with a walker. She lives alone. She reports severe fatigue but denies chest pain.  She also notes a decrease in her energy level as well as increase shortness of breath. l  She denies any hematochezia or melana. She has undergone a  left above-the-knee amputation due to necrotizing fasciitis in October 2010. Her abdominal wound has healed but she still has a large hernia but surgery will need to be delayed due to newly diagnosed diabetes.  She wants Dr Ninfa Linden to operate on her hernia. Per doctor Lona Kettle, she can undergo surgery but will benefit from transfusion of 1 unit perioperatively. If we know date of surgery, we can transfuse her 1 unit here at cancer center 2-3 days prior to surgery. She is not describing any GI bleeding, fatigue etc. she had repeat CBC differential, ferritin, iron, and TIBC performed today.    MEDICAL HISTORY: Past Medical History  Diagnosis Date  . Hypertension   . Colon polyps   . Stomach problems   . Hypokalemic alkalosis   . Allergic rhinitis   . GERD (gastroesophageal reflux disease)   . Stroke 2010    no residual problems  . Anemia, iron deficiency 05/03/2011    recieves iron infusions  . Anemia   . GI bleeding   . Diverticulitis   . Transfusion history   . Difficulty sleeping     takes trazadone for sleep  . Peripheral vascular disease   . Hepatitis     hep C  . Anxiety   . Shortness of breath     due to anemia   PROBLEM LIST:  1. Recurrent iron-deficiency anemia secondary to gastrointestinal bleeding (as noted above). Ms. Bold also has required red cell transfusions in the past, most recently early December 2012. She underwent a small bowel enteroscopy by Dr. Dan Humphreys at [facility] on 08/13/2011. The  enteroscope was passed up to 6 feet into the jejunum. He saw multiple AVMs, which were ablated. Two of the large AVMs bled during APC. These were in the 4th part of the duodenum very close to the ligament of Treitz. The remainder of the AVMs were in the proximal jejunum. In addition to the small bowel AV malformations, which were treated with APC, he saw some small esophageal varices which were not bleeding and also evidence of mild portal  hypertensive gastropathy.  2. History of duodenal arteriovenous malformation.  3. History of hepatitis C infection.  4. Gastroesophageal reflux disease.  5. Depression.  6. Hypertension.  7. History of right Bell's palsy.  8. Status post left above-knee amputation secondary to necrotizing fasciitis 03/06/2009.  9. Neurodermatitis.  10. Previous history of alcohol usage.  11. Admission to the hospital from 12/20/2011 through 12/25/2011 for hematochezia felt to be secondary to a diverticular bleed, requiring 2 units of packed red cells.  12. Diverticulosis.  13. Elevated alpha fetoprotein, 72.3 on 11/19/2011 and 76.0 on 01/11/2012.  14. Abnormal MRI of the liver from 11/28/2011 showing diffuse and markedly low signal intensity throughout the liver as well as diffuse low signal intensity in the adrenal glands. 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 There were no findings for cirrhosis, portal hypertension, splenomegaly or ascites, not were there any worrisome enhancing liver lesions.  15. Systolic ejection murmur.   INTERIM HISTORY: has Essential hypertension, benign; GERD; Necrotizing fasciitis; CARPAL TUNNEL RELEASE, HX OF; Iron deficiency anemia secondary to blood loss (chronic); Chronic hepatitis C without mention of hepatic coma; Ventral hernia; Phantom limb pain; Incisional hernia, without obstruction or gangrene; Secondary hemochromatosis; Type II or unspecified type diabetes mellitus with unspecified complication, uncontrolled; Depression; and Non-healing surgical wound on her problem list.    ALLERGIES:  is allergic to ciprofloxacin; morphine and related; penicillins; codeine; and feraheme.  MEDICATIONS: has a current medication list which includes the following prescription(s): alrex, aspirin ec, atenolol, cetirizine, cyclobenzaprine, esomeprazole, fluorometholone, fluoxetine, fluoxetine, gabapentin, gabapentin, glucose blood, hydrochlorothiazide,  lactose free nutrition, lisinopril, metformin, oxycodone-acetaminophen, potassium chloride, prednisolone acetate, magnesium (amino acid chelate), tramadol, trazodone, trazodone, and vitamin c.  SURGICAL HISTORY:  Past Surgical History  Procedure Laterality Date  . Leg amputation above knee    . Esophagogastroduodenoscopy  05/05/2011    Procedure: ESOPHAGOGASTRODUODENOSCOPY (EGD);  Surgeon: Zenovia Jarred, MD;  Location: [facility];  Service: Gastroenterology;  Laterality: N/A;  Dr. Hilarie Fredrickson will do procedure for Dr. Benson Norway Saturday.  . Esophagogastroduodenoscopy  05/08/2011    Procedure: ESOPHAGOGASTRODUODENOSCOPY (EGD);  Surgeon: Beryle Beams;  Location: [facility];  Service: Endoscopy;  Laterality: N/A;  . Esophagogastroduodenoscopy  06/07/2011    Procedure: ESOPHAGOGASTRODUODENOSCOPY (EGD);  Surgeon: Beryle Beams, MD;  Location: [facility];  Service: Endoscopy;  Laterality: N/A;  . Hot hemostasis  06/07/2011    Procedure: HOT HEMOSTASIS (ARGON PLASMA COAGULATION/BICAP);  Surgeon: Beryle Beams, MD;  Location: [facility];  Service: Endoscopy;  Laterality: N/A;  . Esophagogastroduodenoscopy  12/20/2011    Procedure: ESOPHAGOGASTRODUODENOSCOPY (EGD);  Surgeon: Beryle Beams, MD;  Location: [facility];  Service: Endoscopy;  Laterality: N/A;  . Flexible sigmoidoscopy  12/21/2011    Procedure: FLEXIBLE SIGMOIDOSCOPY;  Surgeon: Beryle Beams, MD;  Location: [facility];  Service: Endoscopy;  Laterality: N/A;  . Carpal tunnel release      rt hand  . Partial colectomy  02/15/2012    Procedure: PARTIAL COLECTOMY;  Surgeon: Harl Bowie, MD;  Location: [facility];  Service: General;  Laterality: N/A;  . Laparotomy  02/16/2012    Procedure: EXPLORATORY LAPAROTOMY;  Surgeon: Edward Jolly, MD;  Location: [facility];  Service: General;  Laterality: N/A;  oversewing of anastomotic leak and rigid sigmoidoscopy  . Enteroscopy N/A 12/04/2012    Procedure: ENTEROSCOPY;  Surgeon: Beryle Beams, MD;   Location: [facility];  Service: Endoscopy;  Laterality: N/A;  . Enteroscopy N/A 12/18/2012    Procedure: ENTEROSCOPY;  Surgeon: Beryle Beams, MD;  Location: [facility];  Service: Endoscopy;  Laterality: N/A;  . Eye surgery Bilateral     cataracts    REVIEW OF SYSTEMS:   Constitutional: Denies fevers, chills or abnormal weight loss; Reports mild fatigue.  Eyes: Denies blurriness of vision Ears, nose, mouth, throat, and face: Denies mucositis or sore throat Respiratory: Denies cough, dyspnea or wheezes Cardiovascular: Denies palpitation, chest discomfort or lower extremity swelling Gastrointestinal:  Denies nausea, heartburn or change in bowel habits, hernia is painful and bleeds sometime the overlying skin. Skin: Denies abnormal skin rashes Lymphatics: Denies new lymphadenopathy or easy bruising Neurological:Denies numbness, tingling or new weaknesses Behavioral/Psych: Mood is stable, no new changes  All other systems were reviewed with the patient and are negative.  PHYSICAL EXAMINATION: ECOG PERFORMANCE STATUS: 2-3  Blood pressure 128/57, pulse 82, temperature 98.5 F (36.9 C), temperature source Oral, resp. rate 18, height 5\' 3"  (1.6 m), weight 182 lb 8 oz (82.781 kg), SpO2 100.00%.  General appearance: alert, appears stated age, no distress and moderately obese  Head: Normocephalic, without obvious abnormality, atraumatic  Neck: no adenopathy, supple, symmetrical, trachea midline and thyroid not enlarged, symmetric, no tenderness/mass/nodules  Lymph nodes: Cervical adenopathy: None appreciated  Heart:regular rate and rhythm, S1, S2 normal and systolic murmur: systolic ejection 2/6, harsh and previously documented without radiation  Lung:chest clear, no wheezing, rales, normal symmetric air entry, Heart exam - S1, S2 normal, no murmur, no gallop, rate regular  Abdomin: soft, distended, normal bowel sounds, nontender and incisional hernia and easily reducible and nontender   EXT:No peripheral edema on right extremity; Left above the knee amputation. She is wearing a prothesis.   Labs:   CBC Latest Ref Rng 03/15/2014 02/18/2014 01/29/2014  WBC 3.9 - 10.3 10e3/uL 3.6(L) 5.1 7.0  Hemoglobin 11.6 - 15.9 g/dL 7.4(L) 10.0(L) 9.5(L)  Hematocrit 34.8 - 46.6 % 24.2(L) 31.7(L) 31.3(L)  Platelets 145 - 400 10e3/uL 274 261 253   Iron/TIBC/Ferritin/ %Sat    Component Value Date/Time   IRON 15* 03/15/2014 1329   IRON 27* 10/03/2012 1048   TIBC 479* 03/15/2014 1329   TIBC 434 10/03/2012 1048   FERRITIN 15 03/15/2014 1329   FERRITIN 41 11/17/2012 1537   IRONPCTSAT 3* 03/15/2014 1329   IRONPCTSAT 6* 10/03/2012 1048   IRONPCTSAT NOT CALC TIBC and %SAT were not calculated due to the UIBC being >450. 02/22/2010 0216     RADIOGRAPHIC STUDIES: Mm Digital Screening Bilateral  07/31/2013   CLINICAL DATA:  Screening.  EXAM: DIGITAL SCREENING BILATERAL MAMMOGRAM WITH CAD  COMPARISON:  Previous exam(s).  ACR Breast Density Category c: The breast tissue is heterogeneously dense, which may obscure small masses.  FINDINGS: There are no findings suspicious for malignancy. Images were processed with CAD.  IMPRESSION: No mammographic evidence of malignancy. A result letter of this screening mammogram will be mailed directly to the patient.  RECOMMENDATION: Screening mammogram in one year. (Code:SM-B-01Y)  BI-RADS CATEGORY  1: Negative.   Electronically Signed   By: Mallie Darting.D.  On: 07/31/2013 09:01   ASSESSMENT: [name] 63 y.o. female with a history of Iron deficiency anemia due to chronic blood loss - Plan: Care order/instruction, 0.9 %  sodium chloride infusion, sodium chloride 0.9 % injection 10 mL, heparin lock flush 100 unit/mL, heparin lock flush 100 unit/mL, sodium chloride 0.9 % injection 3 mL, Practitioner attestation of consent, Type and screen, acetaminophen (TYLENOL) tablet 650 mg, diphenhydrAMINE (BENADRYL) capsule 25 mg, CANCELED: Prepare RBC   PLAN:  1. Iron  deficiency anemia secondary to GI lost.  --She does report fatigue and her hemoglobin 7.4 g/dL is low today. When she last saw Dr. [place] 24 2015 her hemoglobin was 10 g/dL.  We will continue to support this patient as best we can with IV iron and packed RBCs as needed.  We are continuing to check a CBC and ferritin every month. She'll receive fair he today as previously scheduled. We will arrange for her to receive a total of 2 units of packed red blood cells to address her  current level of anemia. --She'll followup in one month with repeat CBC, ferritin and iron studies.  2. Elevated transiminases in the setting of an elevated ferritin likely due to secondary hemochromatosis and chronic hepatitis C.  --We will continue to follow LFTs to establish trend. We prefer to use annual liver MRI and initiate chelation if the liver iron is >3 mg/g dry weight or if the serum ferritin is >1000 ng/mL on two consecutive measurements. Her last MRI of abdomen (11/2011) demonstrated findings consistent with secondary or transfusional hemochromatosis involving the liver, spleen, adrenal glands and bone marrow. No findings for cirrhosis, portal hypertension, splenomegaly or ascites. No worrisome enhancing liver lesions.  Patient was counseled to avoid hepatatoxins including percocet which has tylenol. Transaminases are stable today and other 3 liver tests are normal. We'll continue to check her LFTs monthly  3. Large incisional hernia. --Being watched by surgery.  Surgery delayed due to newly diagnosed diabetes. We are clearing for surgery. PCP needs to give clearance as well.   4. DM2. --Management per PCP's office.   All questions were answered. The patient knows to call the clinic with any problems, questions or concerns. We can certainly see the patient much sooner if necessary.  Patient reviewed with Dr. Lona Kettle.    Wynetta Emery, Aarin Bluett E, PA-C

## 2014-03-15 NOTE — Telephone Encounter (Signed)
misake 

## 2014-03-16 ENCOUNTER — Other Ambulatory Visit: Payer: Self-pay | Admitting: Hematology

## 2014-03-16 ENCOUNTER — Other Ambulatory Visit: Payer: Self-pay | Admitting: Physician Assistant

## 2014-03-16 ENCOUNTER — Ambulatory Visit (HOSPITAL_BASED_OUTPATIENT_CLINIC_OR_DEPARTMENT_OTHER): Payer: Medicare Other

## 2014-03-16 VITALS — BP 134/77 | HR 78 | Temp 98.2°F | Resp 18

## 2014-03-16 DIAGNOSIS — D500 Iron deficiency anemia secondary to blood loss (chronic): Secondary | ICD-10-CM | POA: Diagnosis not present

## 2014-03-16 DIAGNOSIS — D649 Anemia, unspecified: Secondary | ICD-10-CM

## 2014-03-16 LAB — PREPARE RBC (CROSSMATCH)

## 2014-03-16 MED ORDER — SODIUM CHLORIDE 0.9 % IV SOLN
1020.0000 mg | Freq: Once | INTRAVENOUS | Status: AC
  Administered 2014-03-16: 1020 mg via INTRAVENOUS
  Filled 2014-03-16: qty 34

## 2014-03-16 MED ORDER — ACETAMINOPHEN 325 MG PO TABS
650.0000 mg | ORAL_TABLET | Freq: Once | ORAL | Status: AC
  Administered 2014-03-16: 650 mg via ORAL

## 2014-03-16 MED ORDER — FAMOTIDINE 20 MG PO TABS
20.0000 mg | ORAL_TABLET | Freq: Once | ORAL | Status: AC
  Administered 2014-03-16: 20 mg via ORAL

## 2014-03-16 MED ORDER — FAMOTIDINE 20 MG PO TABS
ORAL_TABLET | ORAL | Status: AC
  Filled 2014-03-16: qty 1

## 2014-03-16 MED ORDER — DIPHENHYDRAMINE HCL 25 MG PO CAPS
50.0000 mg | ORAL_CAPSULE | Freq: Once | ORAL | Status: AC
  Administered 2014-03-16: 50 mg via ORAL

## 2014-03-16 MED ORDER — DIPHENHYDRAMINE HCL 25 MG PO CAPS
ORAL_CAPSULE | ORAL | Status: AC
  Filled 2014-03-16: qty 2

## 2014-03-16 MED ORDER — ACETAMINOPHEN 325 MG PO TABS
ORAL_TABLET | ORAL | Status: AC
  Filled 2014-03-16: qty 2

## 2014-03-16 MED ORDER — SODIUM CHLORIDE 0.9 % IV SOLN
250.0000 mL | Freq: Once | INTRAVENOUS | Status: AC
  Administered 2014-03-16: 250 mL via INTRAVENOUS

## 2014-03-16 NOTE — Patient Instructions (Signed)
Ferumoxytol injection What is this medicine? FERUMOXYTOL is an iron complex. Iron is used to make healthy red blood cells, which carry oxygen and nutrients throughout the body. This medicine is used to treat iron deficiency anemia in people with chronic kidney disease. This medicine may be used for other purposes; ask your health care provider or pharmacist if you have questions. COMMON BRAND NAME(S): Feraheme What should I tell my health care provider before I take this medicine? They need to know if you have any of these conditions: -anemia not caused by low iron levels -high levels of iron in the blood -magnetic resonance imaging (MRI) test scheduled -an unusual or allergic reaction to iron, other medicines, foods, dyes, or preservatives -pregnant or trying to get pregnant -breast-feeding How should I use this medicine? This medicine is for injection into a vein. It is given by a health care professional in a hospital or clinic setting. Talk to your pediatrician regarding the use of this medicine in children. Special care may be needed. Overdosage: If you think you've taken too much of this medicine contact a poison control center or emergency room at once. Overdosage: If you think you have taken too much of this medicine contact a poison control center or emergency room at once. NOTE: This medicine is only for you. Do not share this medicine with others. What if I miss a dose? It is important not to miss your dose. Call your doctor or health care professional if you are unable to keep an appointment. What may interact with this medicine? This medicine may interact with the following medications: -other iron products This list may not describe all possible interactions. Give your health care provider a list of all the medicines, herbs, non-prescription drugs, or dietary supplements you use. Also tell them if you smoke, drink alcohol, or use illegal drugs. Some items may interact with your  medicine. What should I watch for while using this medicine? Visit your doctor or healthcare professional regularly. Tell your doctor or healthcare professional if your symptoms do not start to get better or if they get worse. You may need blood work done while you are taking this medicine. You may need to follow a special diet. Talk to your doctor. Foods that contain iron include: whole grains/cereals, dried fruits, beans, or peas, leafy green vegetables, and organ meats (liver, kidney). What side effects may I notice from receiving this medicine? Side effects that you should report to your doctor or health care professional as soon as possible: -allergic reactions like skin rash, itching or hives, swelling of the face, lips, or tongue -breathing problems -changes in blood pressure -feeling faint or lightheaded, falls -fever or chills -flushing, sweating, or hot feelings -swelling of the ankles or feet Side effects that usually do not require medical attention (Report these to your doctor or health care professional if they continue or are bothersome.): -diarrhea -headache -nausea, vomiting -stomach pain This list may not describe all possible side effects. Call your doctor for medical advice about side effects. You may report side effects to FDA at 1-800-FDA-1088. Where should I keep my medicine? This drug is given in a hospital or clinic and will not be stored at home. NOTE: This sheet is a summary. It may not cover all possible information. If you have questions about this medicine, talk to your doctor, pharmacist, or health care provider.  2015, Elsevier/Gold Standard. (2011-12-28 15:23:36)  Blood Transfusion Information WHAT IS A BLOOD TRANSFUSION? A transfusion is the replacement   of blood or some of its parts. Blood is made up of multiple cells which provide different functions.  Red blood cells carry oxygen and are used for blood loss replacement.  White blood cells fight  against infection.  Platelets control bleeding.  Plasma helps clot blood.  Other blood products are available for specialized needs, such as hemophilia or other clotting disorders. BEFORE THE TRANSFUSION  Who gives blood for transfusions?   You may be able to donate blood to be used at a later date on yourself (autologous donation).  Relatives can be asked to donate blood. This is generally not any safer than if you have received blood from a stranger. The same precautions are taken to ensure safety when a relative's blood is donated.  Healthy volunteers who are fully evaluated to make sure their blood is safe. This is blood bank blood. Transfusion therapy is the safest it has ever been in the practice of medicine. Before blood is taken from a donor, a complete history is taken to make sure that person has no history of diseases nor engages in risky social behavior (examples are intravenous drug use or sexual activity with multiple partners). The donor's travel history is screened to minimize risk of transmitting infections, such as malaria. The donated blood is tested for signs of infectious diseases, such as HIV and hepatitis. The blood is then tested to be sure it is compatible with you in order to minimize the chance of a transfusion reaction. If you or a relative donates blood, this is often done in anticipation of surgery and is not appropriate for emergency situations. It takes many days to process the donated blood. RISKS AND COMPLICATIONS Although transfusion therapy is very safe and saves many lives, the main dangers of transfusion include:   Getting an infectious disease.  Developing a transfusion reaction. This is an allergic reaction to something in the blood you were given. Every precaution is taken to prevent this. The decision to have a blood transfusion has been considered carefully by your caregiver before blood is given. Blood is not given unless the benefits outweigh the  risks. AFTER THE TRANSFUSION  Right after receiving a blood transfusion, you will usually feel much better and more energetic. This is especially true if your red blood cells have gotten low (anemic). The transfusion raises the level of the red blood cells which carry oxygen, and this usually causes an energy increase.  The nurse administering the transfusion will monitor you carefully for complications. HOME CARE INSTRUCTIONS  No special instructions are needed after a transfusion. You may find your energy is better. Speak with your caregiver about any limitations on activity for underlying diseases you may have. SEEK MEDICAL CARE IF:   Your condition is not improving after your transfusion.  You develop redness or irritation at the intravenous (IV) site. SEEK IMMEDIATE MEDICAL CARE IF:  Any of the following symptoms occur over the next 12 hours:  Shaking chills.  You have a temperature by mouth above 102 F (38.9 C), not controlled by medicine.  Chest, back, or muscle pain.  People around you feel you are not acting correctly or are confused.  Shortness of breath or difficulty breathing.  Dizziness and fainting.  You get a rash or develop hives.  You have a decrease in urine output.  Your urine turns a dark color or changes to pink, red, or brown. Any of the following symptoms occur over the next 10 days:  You have a temperature   by mouth above 102 F (38.9 C), not controlled by medicine.  Shortness of breath.  Weakness after normal activity.  The white part of the eye turns yellow (jaundice).  You have a decrease in the amount of urine or are urinating less often.  Your urine turns a dark color or changes to pink, red, or brown. Document Released: 05/11/2000 Document Revised: 08/06/2011 Document Reviewed: 12/29/2007 ExitCare Patient Information 2015 [employer]. This information is not intended to replace advice given to you by your health care provider. Make  sure you discuss any questions you have with your health care provider.   

## 2014-03-17 ENCOUNTER — Ambulatory Visit (HOSPITAL_BASED_OUTPATIENT_CLINIC_OR_DEPARTMENT_OTHER): Payer: Medicare Other

## 2014-03-17 VITALS — BP 160/95 | HR 70 | Temp 98.4°F | Resp 18

## 2014-03-17 DIAGNOSIS — D500 Iron deficiency anemia secondary to blood loss (chronic): Secondary | ICD-10-CM

## 2014-03-17 DIAGNOSIS — D649 Anemia, unspecified: Secondary | ICD-10-CM

## 2014-03-17 MED ORDER — DIPHENHYDRAMINE HCL 25 MG PO CAPS
ORAL_CAPSULE | ORAL | Status: AC
  Filled 2014-03-17: qty 1

## 2014-03-17 MED ORDER — DIPHENHYDRAMINE HCL 25 MG PO CAPS
25.0000 mg | ORAL_CAPSULE | Freq: Once | ORAL | Status: AC
  Administered 2014-03-17: 25 mg via ORAL

## 2014-03-17 MED ORDER — ACETAMINOPHEN 325 MG PO TABS
ORAL_TABLET | ORAL | Status: AC
  Filled 2014-03-17: qty 2

## 2014-03-17 MED ORDER — ACETAMINOPHEN 325 MG PO TABS
650.0000 mg | ORAL_TABLET | Freq: Once | ORAL | Status: AC
  Administered 2014-03-17: 650 mg via ORAL

## 2014-03-17 NOTE — Patient Instructions (Signed)

## 2014-03-18 ENCOUNTER — Other Ambulatory Visit: Payer: Self-pay | Admitting: Internal Medicine

## 2014-03-18 LAB — TYPE AND SCREEN
ABO/RH(D): AB POS
ANTIBODY SCREEN: NEGATIVE
UNIT DIVISION: 0
Unit division: 0

## 2014-03-19 ENCOUNTER — Ambulatory Visit (INDEPENDENT_AMBULATORY_CARE_PROVIDER_SITE_OTHER): Payer: Medicare Other | Admitting: Internal Medicine

## 2014-03-19 ENCOUNTER — Ambulatory Visit (HOSPITAL_COMMUNITY)
Admission: RE | Admit: 2014-03-19 | Discharge: 2014-03-19 | Disposition: A | Discharge status: Home / Self Care | Payer: Medicare Other | Source: Ambulatory Visit | Attending: Internal Medicine | Admitting: Internal Medicine

## 2014-03-19 VITALS — BP 124/74 | HR 75 | Temp 98.2°F | Ht 63.0 in | Wt 181.9 lb

## 2014-03-19 DIAGNOSIS — Z01810 Encounter for preprocedural cardiovascular examination: Secondary | ICD-10-CM | POA: Insufficient documentation

## 2014-03-19 DIAGNOSIS — Z23 Encounter for immunization: Secondary | ICD-10-CM | POA: Diagnosis not present

## 2014-03-19 DIAGNOSIS — T8189XS Other complications of procedures, not elsewhere classified, sequela: Secondary | ICD-10-CM

## 2014-03-19 DIAGNOSIS — E119 Type 2 diabetes mellitus without complications: Secondary | ICD-10-CM | POA: Diagnosis not present

## 2014-03-19 MED ORDER — OXYCODONE-ACETAMINOPHEN 10-325 MG PO TABS: 1.0000 | ORAL_TABLET | Freq: Four times a day (QID) | ORAL | Status: DC | PRN

## 2014-03-19 NOTE — Assessment & Plan Note (Signed)
Pt has no major clinical predictors. She is going for an intermediate risk procedure. Has good  functional Capacity at 5-6 mets. At this time there are no contraindications to surgery.  Case discussed with supervising Attending Physician

## 2014-03-19 NOTE — Assessment & Plan Note (Signed)
>>  ASSESSMENT AND PLAN FOR TYPE 2 DIABETES MELLITUS WITH DIABETIC NEUROPATHY ([facility]) WRITTEN ON 03/19/2014  1:50 PM BY MONTY BAIL, MD  Lab Results  Component Value Date   HGBA1C 6.1 12/18/2013   HGBA1C 6.2 09/18/2013   HGBA1C 6.6 06/05/2013     Assessment: Diabetes control:   Progress toward A1C goal:    Comments: this is taken in the context of needing multiple transfusions but pt sugars have been in the 100-150s.   Plan: Medications:  continue metformin  500 mg BID Home glucose monitoring: Frequency:   Timing:   Instruction/counseling given: reminded to get eye exam, reminded to bring blood glucose meter & log to each visit, reminded to bring medications to each visit and discussed diet Educational resources provided:   Self management tools provided:   Other plans: pt received flu vaccine, and urine microalbumin today

## 2014-03-19 NOTE — Progress Notes (Signed)
Subjective:   Patient ID: [name] female   DOB: 1951-01-03 63 y.o.   MRN: [id number]  HPI: [name] is a 63 y.o. woman pmh as listed below presents for pre-op evaluation and DM recheck.    Clinical Predictors (cardiac history): -Intermediate: given known diabetes mellitus  Functional capacity (exercise capacity): -Poor/Moderate (4-5 METS): eat, dress, bathe, walk slowly on 2 level blocks this complicated by pt BKA,  moderate house work (vacuuming, sweeping carrying groceries)  Estimated Surgical Risk: -Intermediate (Cardiac risk 1-5%):  intraperitoneal with abdominal hernia repair  LABS:  ========================================================================= BMET    Component Value Date/Time   NA 136 03/15/2014 1329   NA 128* 04/09/2012 2333   K 4.6 03/15/2014 1329   K 3.8 04/09/2012 2333   CL 104 10/03/2012 1048   CL 92* 04/09/2012 2333   CO2 23 03/15/2014 1329   CO2 27 04/09/2012 2333   GLUCOSE 150* 03/15/2014 1329   GLUCOSE 154* 10/03/2012 1048   GLUCOSE 106* 04/09/2012 2333   BUN 18.7 03/15/2014 1329   BUN 7 04/09/2012 2333   CREATININE 1.1 03/15/2014 1329   CREATININE 0.61 04/09/2012 2333   CALCIUM 8.8 03/15/2014 1329   CALCIUM 8.0* 04/10/2012 0410   GFRNONAA >90 04/09/2012 2333   GFRAA >90 04/09/2012 2333    CBC    Component Value Date/Time   WBC 3.6* 03/15/2014 1329   WBC 9.6 04/09/2012 1615   RBC 2.88* 03/15/2014 1329   RBC 3.91 04/09/2012 1615   RBC 3.37* 05/08/2010 1408   HGB 7.4* 03/15/2014 1329   HGB 11.3* 04/09/2012 1615   HCT 24.2* 03/15/2014 1329   HCT 34.4* 04/09/2012 1615   PLT 274 03/15/2014 1329   PLT 166 04/09/2012 1615   MCV 84.0 03/15/2014 1329   MCV 88.0 04/09/2012 1615   MCH 25.7 03/15/2014 1329   MCH 28.9 04/09/2012 1615   MCHC 30.6* 03/15/2014 1329   MCHC 32.8 04/09/2012 1615   RDW 19.1* 03/15/2014 1329   RDW 17.1* 04/09/2012 1615   LYMPHSABS 1.0 03/15/2014 1329   LYMPHSABS 2.7 04/09/2012 1615   MONOABS 0.6  03/15/2014 1329   MONOABS 1.4* 04/09/2012 1615   EOSABS 0.1 03/15/2014 1329   EOSABS 0.2 04/09/2012 1615   BASOSABS 0.0 03/15/2014 1329   BASOSABS 0.0 04/09/2012 1615      Other Studies:  ========================================================================= EKG: NSR with 1st degree AV block unchanged from baseline    Past Medical History  Diagnosis Date  . Hypertension   . Colon polyps   . Stomach problems   . Hypokalemic alkalosis   . Allergic rhinitis   . GERD (gastroesophageal reflux disease)   . Stroke 2010    no residual problems  . Anemia, iron deficiency 05/03/2011    recieves iron infusions  . Anemia   . GI bleeding   . Diverticulitis   . Transfusion history   . Difficulty sleeping     takes trazadone for sleep  . Peripheral vascular disease   . Hepatitis     hep C  . Anxiety   . Shortness of breath     due to anemia   Current Outpatient Prescriptions  Medication Sig Dispense Refill  . ALREX 0.2 % SUSP       . aspirin EC 81 MG tablet Take 162 mg by mouth 2 (two) times daily.      Marland Kitchen atenolol (TENORMIN) 50 MG tablet Take 50 mg by mouth every morning.       . cetirizine (  ZYRTEC) 10 MG tablet Take 10 mg by mouth every morning.       . cetirizine (ZYRTEC) 10 MG tablet TAKE 1 TABLET BY MOUTH DAILY  90 tablet  3  . cyclobenzaprine (FLEXERIL) 10 MG tablet Take 1 tablet (10 mg total) by mouth 2 (two) times daily as needed for muscle spasms.  20 tablet  0  . esomeprazole (NEXIUM) 40 MG capsule Take 40 mg by mouth 2 (two) times daily.      . fluorometholone (FML) 0.1 % ophthalmic suspension Place 1 drop into the left eye 2 (two) times daily.      Marland Kitchen FLUoxetine (PROZAC) 20 MG tablet Take 40 mg by mouth every morning.       Marland Kitchen FLUoxetine (PROZAC) 40 MG capsule TAKE 1 CAPSULE BY MOUTH DAILY  30 capsule  5  . gabapentin (NEURONTIN) 400 MG capsule Take 800 mg by mouth 3 (three) times daily.      Marland Kitchen gabapentin (NEURONTIN) 400 MG capsule TAKE 2 CAPSULE BY MOUTH THREE TIMES  A DAY  180 capsule  3  . glucose blood (ACCU-CHEK ACTIVE STRIPS) test strip Use as instructed  100 each  12  . hydrochlorothiazide (HYDRODIURIL) 25 MG tablet Take 25 mg by mouth every morning.      . lactose free nutrition (BOOST PLUS) LIQD Take 237 mLs by mouth 3 (three) times daily with meals.  237 mL  0  . lisinopril (PRINIVIL,ZESTRIL) 40 MG tablet Take 1 tablet (40 mg total) by mouth every morning.  90 tablet  4  . metFORMIN (GLUCOPHAGE) 500 MG tablet Take 500 mg by mouth daily with breakfast.      . oxyCODONE-acetaminophen (PERCOCET) 10-325 MG per tablet Take 1 tablet by mouth every 6 (six) hours as needed for pain.  120 tablet  0  . potassium chloride 20 MEQ/15ML (10%) SOLN TAKE 15ML BY MOUTH EVERY DAY  450 mL  0  . prednisoLONE acetate (PRED FORTE) 1 % ophthalmic suspension Place 1 drop into the left eye 3 (three) times daily.      Marland Kitchen Specialty Vitamins Products (MAGNESIUM, AMINO ACID CHELATE,) 133 MG tablet Take 1 tablet by mouth 2 (two) times daily.      . traMADol (ULTRAM) 50 MG tablet Take 1-2 tablets (50-100 mg total) by mouth every 6 (six) hours as needed.  30 tablet  1  . traZODone (DESYREL) 100 MG tablet Take 100 mg by mouth at bedtime.      . traZODone (DESYREL) 100 MG tablet TAKE 1 TABLET BY MOUTH EVERY NIGHT AT BEDTIME  30 tablet  11  . vitamin C (ASCORBIC ACID) 500 MG tablet Take 500 mg by mouth every morning.       No current facility-administered medications for this visit.   Family History  Problem Relation Age of Onset  . Cancer Father     unknown  . Hypertension Mother   . Diverticulitis Mother    History   Social History  . Marital Status: Widowed    Spouse Name: N/A    Number of Children: N/A  . Years of Education: N/A   Social History Main Topics  . Smoking status: Current Every Day Smoker -- 0.20 packs/day for 17 years    Types: Cigarettes  . Smokeless tobacco: Never Used     Comment: wants to quit  . Alcohol Use: Yes     Comment: Occasionally  . Drug  Use: Yes    Special: Marijuana, "Crack" cocaine  Comment: Thanksgiving and Christmas  Holidays 2014 hasn't had any since  . Sexual Activity: Not on file   Other Topics Concern  . Not on file   Social History Narrative  . No narrative on file   Review of Systems: Pt. Denies hx of fevers, nightsweats, chills, headaches, vision changes,  hearing changes, SOB, DOE,  Has chronic abdominal pain / discomfort, chest pain / discomfort,  palpitations, diarrhea, constipation, musculoskeletal pain or weakness, polyuria,  polydipsea, generalized weakness, or fatigue.   Objective:  Physical Exam: Filed Vitals:   03/19/14 1339  BP: 124/74  Pulse: 75  Temp: 98.2 F (36.8 C)  TempSrc: Oral  Weight: 181 lb 14.4 oz (82.509 kg)  SpO2: 98%   General: sitting in wheelchair, NAD  HEENT: PERRL, EOMI, no scleral icterus Cardiac: RRR, no rubs, murmurs or gallops Pulm: clear to auscultation bilaterally, moving normal volumes of air Abd: soft, nontender, nondistended, BS present Ext: warm and well perfused, no pedal edema, L BKA Neuro: alert and oriented X3, cranial nerves II-XII grossly intact  Assessment & Plan:  Please see problem oriented charting  Pt discussed with Dr. Lynnae January

## 2014-03-19 NOTE — Assessment & Plan Note (Signed)
Lab Results  Component Value Date   HGBA1C 6.1 12/18/2013   HGBA1C 6.2 09/18/2013   HGBA1C 6.6 06/05/2013     Assessment: Diabetes control:   Progress toward A1C goal:    Comments: this is taken in the context of needing multiple transfusions but pt sugars have been in the 100-150s.   Plan: Medications:  continue metformin 500 mg BID Home glucose monitoring: Frequency:   Timing:   Instruction/counseling given: reminded to get eye exam, reminded to bring blood glucose meter & log to each visit, reminded to bring medications to each visit and discussed diet Educational resources provided:   Self management tools provided:   Other plans: pt received flu vaccine, and urine microalbumin today

## 2014-03-19 NOTE — Patient Instructions (Signed)
General Instructions: You are cleared for surgery! Best of luck and continue taking care of your diabetes. Keep up the good work.   Please bring your medicines with you each time you come to clinic.  Medicines may include prescription medications, over-the-counter medications, herbal remedies, eye drops, vitamins, or other pills.   Progress Toward Treatment Goals:  Treatment Goal 08/18/2012  Blood pressure unchanged  Stop smoking smoking the same amount    Self Care Goals & Plans:  Self Care Goal 12/18/2013  Manage my medications take my medicines as prescribed; bring my medications to every visit; refill my medications on time; follow the sick day instructions if I am sick  Monitor my health keep track of my blood glucose; bring my glucose meter and log to each visit; keep track of my blood pressure  Eat healthy foods drink diet soda or water instead of juice or soda; eat more vegetables; eat foods that are low in salt; eat baked foods instead of fried foods; eat fruit for snacks and desserts  Be physically active -  Stop smoking -  Prevent falls -    No flowsheet data found.   Care Management & Community Referrals:  No flowsheet data found.

## 2014-03-20 LAB — MICROALBUMIN / CREATININE URINE RATIO
Creatinine, Urine: 85.7 mg/dL
MICROALB/CREAT RATIO: 28 mg/g (ref 0.0–30.0)
Microalb, Ur: 2.4 mg/dL — ABNORMAL HIGH (ref <2.0)

## 2014-03-26 NOTE — Progress Notes (Signed)
[facility] Attending  Case discussed with Dr. Sadek soon after the resident saw the patient.  We reviewed the resident's history and exam and pertinent patient test results.  I agree with the assessment, diagnosis, and plan of care documented in the resident's note. 

## 2014-04-07 ENCOUNTER — Other Ambulatory Visit: Payer: Self-pay | Admitting: Dietician

## 2014-04-07 DIAGNOSIS — E119 Type 2 diabetes mellitus without complications: Secondary | ICD-10-CM

## 2014-04-07 NOTE — Telephone Encounter (Signed)
Out of testing supplies for her diabetes. Needs it sent to [facility], [facility] does not handle them for Medicare

## 2014-04-08 MED ORDER — GLUCOSE BLOOD VI STRP: ORAL_STRIP | Status: DC

## 2014-04-08 MED ORDER — ACCU-CHEK FASTCLIX LANCETS MISC: Status: DC

## 2014-04-16 ENCOUNTER — Other Ambulatory Visit (INDEPENDENT_AMBULATORY_CARE_PROVIDER_SITE_OTHER): Payer: Self-pay | Admitting: Surgery

## 2014-04-16 DIAGNOSIS — K432 Incisional hernia without obstruction or gangrene: Secondary | ICD-10-CM | POA: Diagnosis not present

## 2014-04-21 ENCOUNTER — Other Ambulatory Visit: Payer: Self-pay | Admitting: *Deleted

## 2014-04-21 DIAGNOSIS — T8189XS Other complications of procedures, not elsewhere classified, sequela: Secondary | ICD-10-CM

## 2014-04-27 MED ORDER — OXYCODONE-ACETAMINOPHEN 10-325 MG PO TABS: 1.0000 | ORAL_TABLET | Freq: Four times a day (QID) | ORAL | Status: DC | PRN

## 2014-04-27 NOTE — Telephone Encounter (Signed)
Pt aware.

## 2014-05-03 ENCOUNTER — Ambulatory Visit (HOSPITAL_BASED_OUTPATIENT_CLINIC_OR_DEPARTMENT_OTHER): Payer: Medicare Other | Admitting: Nurse Practitioner

## 2014-05-03 ENCOUNTER — Other Ambulatory Visit: Payer: Self-pay | Admitting: *Deleted

## 2014-05-03 ENCOUNTER — Ambulatory Visit (HOSPITAL_BASED_OUTPATIENT_CLINIC_OR_DEPARTMENT_OTHER): Payer: Medicare Other

## 2014-05-03 ENCOUNTER — Telehealth: Payer: Self-pay | Admitting: *Deleted

## 2014-05-03 VITALS — BP 110/63 | HR 73 | Temp 99.3°F | Resp 18 | Ht 63.0 in

## 2014-05-03 DIAGNOSIS — R252 Cramp and spasm: Secondary | ICD-10-CM | POA: Diagnosis not present

## 2014-05-03 DIAGNOSIS — R7401 Elevation of levels of liver transaminase levels: Secondary | ICD-10-CM

## 2014-05-03 DIAGNOSIS — R740 Nonspecific elevation of levels of transaminase and lactic acid dehydrogenase [LDH]: Secondary | ICD-10-CM

## 2014-05-03 DIAGNOSIS — R5383 Other fatigue: Secondary | ICD-10-CM | POA: Diagnosis not present

## 2014-05-03 DIAGNOSIS — K432 Incisional hernia without obstruction or gangrene: Secondary | ICD-10-CM

## 2014-05-03 DIAGNOSIS — D500 Iron deficiency anemia secondary to blood loss (chronic): Secondary | ICD-10-CM

## 2014-05-03 DIAGNOSIS — E119 Type 2 diabetes mellitus without complications: Secondary | ICD-10-CM

## 2014-05-03 LAB — COMPREHENSIVE METABOLIC PANEL (CC13)
ALT: 97 U/L — AB (ref 0–55)
AST: 123 U/L — AB (ref 5–34)
Albumin: 3.3 g/dL — ABNORMAL LOW (ref 3.5–5.0)
Alkaline Phosphatase: 88 U/L (ref 40–150)
Anion Gap: 11 mEq/L (ref 3–11)
BILIRUBIN TOTAL: 0.4 mg/dL (ref 0.20–1.20)
BUN: 15 mg/dL (ref 7.0–26.0)
CO2: 25 mEq/L (ref 22–29)
CREATININE: 0.9 mg/dL (ref 0.6–1.1)
Calcium: 8.9 mg/dL (ref 8.4–10.4)
Chloride: 102 mEq/L (ref 98–109)
EGFR: 84 mL/min/{1.73_m2} — ABNORMAL LOW (ref >90)
Glucose: 135 mg/dl (ref 70–140)
Potassium: 4.1 mEq/L (ref 3.5–5.1)
SODIUM: 137 meq/L (ref 136–145)
TOTAL PROTEIN: 6.6 g/dL (ref 6.4–8.3)

## 2014-05-03 LAB — CBC WITH DIFFERENTIAL/PLATELET
BASO%: 0.2 % (ref 0.0–2.0)
Basophils Absolute: 0 10*3/uL (ref 0.0–0.1)
EOS ABS: 0.1 10*3/uL (ref 0.0–0.5)
EOS%: 1.1 % (ref 0.0–7.0)
HCT: 33.3 % — ABNORMAL LOW (ref 34.8–46.6)
HEMOGLOBIN: 10.6 g/dL — AB (ref 11.6–15.9)
LYMPH%: 24 % (ref 14.0–49.7)
MCH: 29.8 pg (ref 25.1–34.0)
MCHC: 31.8 g/dL (ref 31.5–36.0)
MCV: 93.5 fL (ref 79.5–101.0)
MONO#: 0.6 10*3/uL (ref 0.1–0.9)
MONO%: 13 % (ref 0.0–14.0)
NEUT%: 61.7 % (ref 38.4–76.8)
NEUTROS ABS: 2.9 10*3/uL (ref 1.5–6.5)
Platelets: 182 10*3/uL (ref 145–400)
RBC: 3.56 10*6/uL — ABNORMAL LOW (ref 3.70–5.45)
RDW: 18.8 % — AB (ref 11.2–14.5)
WBC: 4.7 10*3/uL (ref 3.9–10.3)
lymph#: 1.1 10*3/uL (ref 0.9–3.3)

## 2014-05-03 LAB — IRON AND TIBC CHCC
%SAT: 30 % (ref 21–57)
Iron: 105 ug/dL (ref 41–142)
TIBC: 349 ug/dL (ref 236–444)
UIBC: 243 ug/dL (ref 120–384)

## 2014-05-03 LAB — FERRITIN CHCC: Ferritin: 230 ng/ml (ref 9–269)

## 2014-05-03 NOTE — Progress Notes (Signed)
1545- Patient's cane was left in infusion room last blood transfusion. Cane given to patient prior to discharge.

## 2014-05-03 NOTE — Telephone Encounter (Signed)
Pt called and left message on voice mail.  Spoke with pt and was informed that since yesterday, pt has been experiencing hands and legs cramping.  Pt stated this is usually a sign that her iron is low.  Pt stated she has short of breath on exertion, feeling very tired.  Pt wanted to know if she could come in for labs draw today.  Jenny Reichmann, NP symptom management notified. Gave pt lab appt today at 145 pm. Pt's  Phone   [phone].

## 2014-05-04 ENCOUNTER — Telehealth: Payer: Self-pay | Admitting: Hematology

## 2014-05-04 ENCOUNTER — Encounter: Payer: Self-pay | Admitting: Nurse Practitioner

## 2014-05-04 DIAGNOSIS — R740 Nonspecific elevation of levels of transaminase and lactic acid dehydrogenase [LDH]: Secondary | ICD-10-CM

## 2014-05-04 DIAGNOSIS — R5383 Other fatigue: Secondary | ICD-10-CM | POA: Insufficient documentation

## 2014-05-04 DIAGNOSIS — R252 Cramp and spasm: Secondary | ICD-10-CM | POA: Insufficient documentation

## 2014-05-04 DIAGNOSIS — R7401 Elevation of levels of liver transaminase levels: Secondary | ICD-10-CM | POA: Insufficient documentation

## 2014-05-04 NOTE — Telephone Encounter (Signed)
Confirm appt for 05/27/14. Mailed cal.

## 2014-05-04 NOTE — Assessment & Plan Note (Signed)
Patient is complaining of increased fatigue for the past few weeks.  She feels this is most likely related to her iron deficiency anemia and need for a Feraheme infusion.  However, hemoglobin has increased to 10.4; and ferritin/iron levels are within normal limits.  Patient denies any infection symptoms whatsoever.  Patient denies any other new pain symptoms today.  Advised patient to push fluids and remain as active as possible.

## 2014-05-04 NOTE — Assessment & Plan Note (Signed)
Patient does have a chronic, large abdominal incision hernia.  She has plans to have this surgically repaired per Dr. Rush Farmer the first week of January 2016.  Poor most recent notes-the plan is for patient to receive 1 unit of packed red blood cells approximately one week prior to her abdominal hernia surgery.  Advised patient would make the arrangements for blood transfusion; and let her know the specific details regarding her appointment.

## 2014-05-04 NOTE — Progress Notes (Signed)
will   SYMPTOM MANAGEMENT CLINIC   HPI: [name] 63 y.o. female diagnosed with iron deficiency anemia.  Intermittently undergoing chronic Feraheme infusions.  Patient called the cancer Center today requested an an urgent care visit.  Patient is complaining of increased fatigue and some generalized muscle cramping.  She states that she last received a fair hand infusion on 03/16/2014; that she is due for her next iron infusion.  She states that she was instructed to obtain a Feraheme on a monthly basis.  She denies any infection symptoms whatsoever.  She denies any new pain.  Also, patient states that she was attempting to get out of the bed and transfer to wheelchair yesterday; and did fall off the bed.  She denies any specific injury or new pain.  She denies hitting her head or any loss of consciousness.  Patient is a left above-the-knee amputee status post necrotizing fasciitis in 2010.  She's also been diagnosed within this past year with diabetes as well.   HPI  ROS  Past Medical History  Diagnosis Date  . Hypertension   . Colon polyps   . Stomach problems   . Hypokalemic alkalosis   . Allergic rhinitis   . GERD (gastroesophageal reflux disease)   . Stroke 2010    no residual problems  . Anemia, iron deficiency 05/03/2011    recieves iron infusions  . Anemia   . GI bleeding   . Diverticulitis   . Transfusion history   . Difficulty sleeping     takes trazadone for sleep  . Peripheral vascular disease   . Hepatitis     hep C  . Anxiety   . Shortness of breath     due to anemia    Past Surgical History  Procedure Laterality Date  . Leg amputation above knee    . Esophagogastroduodenoscopy  05/05/2011    Procedure: ESOPHAGOGASTRODUODENOSCOPY (EGD);  Surgeon: Zenovia Jarred, MD;  Location: [facility];  Service: Gastroenterology;  Laterality: N/A;  Dr. Hilarie Fredrickson will do procedure for Dr. Benson Norway Saturday.  . Esophagogastroduodenoscopy  05/08/2011    Procedure:  ESOPHAGOGASTRODUODENOSCOPY (EGD);  Surgeon: Beryle Beams;  Location: [facility];  Service: Endoscopy;  Laterality: N/A;  . Esophagogastroduodenoscopy  06/07/2011    Procedure: ESOPHAGOGASTRODUODENOSCOPY (EGD);  Surgeon: Beryle Beams, MD;  Location: [facility];  Service: Endoscopy;  Laterality: N/A;  . Hot hemostasis  06/07/2011    Procedure: HOT HEMOSTASIS (ARGON PLASMA COAGULATION/BICAP);  Surgeon: Beryle Beams, MD;  Location: [facility];  Service: Endoscopy;  Laterality: N/A;  . Esophagogastroduodenoscopy  12/20/2011    Procedure: ESOPHAGOGASTRODUODENOSCOPY (EGD);  Surgeon: Beryle Beams, MD;  Location: [facility];  Service: Endoscopy;  Laterality: N/A;  . Flexible sigmoidoscopy  12/21/2011    Procedure: FLEXIBLE SIGMOIDOSCOPY;  Surgeon: Beryle Beams, MD;  Location: [facility];  Service: Endoscopy;  Laterality: N/A;  . Carpal tunnel release      rt hand  . Partial colectomy  02/15/2012    Procedure: PARTIAL COLECTOMY;  Surgeon: Harl Bowie, MD;  Location: [facility];  Service: General;  Laterality: N/A;  . Laparotomy  02/16/2012    Procedure: EXPLORATORY LAPAROTOMY;  Surgeon: Edward Jolly, MD;  Location: [facility];  Service: General;  Laterality: N/A;  oversewing of anastomotic leak and rigid sigmoidoscopy  . Enteroscopy N/A 12/04/2012    Procedure: ENTEROSCOPY;  Surgeon: Beryle Beams, MD;  Location: [facility];  Service: Endoscopy;  Laterality: N/A;  . Enteroscopy N/A 12/18/2012  Procedure: ENTEROSCOPY;  Surgeon: Beryle Beams, MD;  Location: [facility];  Service: Endoscopy;  Laterality: N/A;  . Eye surgery Bilateral     cataracts    has Essential hypertension, benign; GERD; Necrotizing fasciitis; CARPAL TUNNEL RELEASE, HX OF; Iron deficiency anemia due to chronic blood loss; Chronic hepatitis C without mention of hepatic coma; Ventral hernia; Phantom limb pain; Incisional hernia, without obstruction or gangrene; Secondary hemochromatosis; Diabetes type 2,  controlled; Depression; Non-healing surgical wound; Pre-operative cardiovascular examination; Fatigue; Transaminitis; and Muscle cramps on her problem list.     is allergic to ciprofloxacin; morphine and related; penicillins; codeine; and feraheme.    Medication List       This list is accurate as of: 05/03/14 11:59 PM.  Always use your most recent med list.               ACCU-CHEK FASTCLIX LANCETS Misc  Check your blood sugar one time a day     ALREX 0.2 % Susp  Generic drug:  loteprednol     aspirin EC 81 MG tablet  Take 162 mg by mouth 2 (two) times daily.     atenolol 50 MG tablet  Commonly known as:  TENORMIN  Take 50 mg by mouth every morning.     cetirizine 10 MG tablet  Commonly known as:  ZYRTEC  TAKE 1 TABLET BY MOUTH DAILY     cetirizine 10 MG tablet  Commonly known as:  ZYRTEC  Take 10 mg by mouth every morning.     cyclobenzaprine 10 MG tablet  Commonly known as:  FLEXERIL  Take 1 tablet (10 mg total) by mouth 2 (two) times daily as needed for muscle spasms.     esomeprazole 40 MG capsule  Commonly known as:  NEXIUM  Take 40 mg by mouth 2 (two) times daily.     fluorometholone 0.1 % ophthalmic suspension  Commonly known as:  FML  Place 1 drop into the left eye 2 (two) times daily.     FLUoxetine 20 MG tablet  Commonly known as:  PROZAC  Take 40 mg by mouth every morning.     FLUoxetine 40 MG capsule  Commonly known as:  PROZAC  TAKE 1 CAPSULE BY MOUTH DAILY     gabapentin 400 MG capsule  Commonly known as:  NEURONTIN  Take 800 mg by mouth 3 (three) times daily.     gabapentin 400 MG capsule  Commonly known as:  NEURONTIN  TAKE 2 CAPSULE BY MOUTH THREE TIMES A DAY     glucose blood test strip  Commonly known as:  ACCU-CHEK SMARTVIEW  Check blood sugar  one time a day     hydrochlorothiazide 25 MG tablet  Commonly known as:  HYDRODIURIL  Take 25 mg by mouth every morning.     lactose free nutrition Liqd  Take 237 mLs by mouth 3 (three)  times daily with meals.     lisinopril 40 MG tablet  Commonly known as:  PRINIVIL,ZESTRIL  Take 1 tablet (40 mg total) by mouth every morning.     magnesium (amino acid chelate) 133 MG tablet  Take 1 tablet by mouth 2 (two) times daily.     metFORMIN 500 MG tablet  Commonly known as:  GLUCOPHAGE  Take 500 mg by mouth daily with breakfast.     oxyCODONE-acetaminophen 10-325 MG per tablet  Commonly known as:  PERCOCET  Take 1 tablet by mouth every 6 (six) hours as needed for pain.  potassium chloride 20 MEQ/15ML (10%) Soln  TAKE 15ML BY MOUTH EVERY DAY     prednisoLONE acetate 1 % ophthalmic suspension  Commonly known as:  PRED FORTE  Place 1 drop into the left eye 3 (three) times daily.     traMADol 50 MG tablet  Commonly known as:  ULTRAM  Take 1-2 tablets (50-100 mg total) by mouth every 6 (six) hours as needed.     traZODone 100 MG tablet  Commonly known as:  DESYREL  TAKE 1 TABLET BY MOUTH EVERY NIGHT AT BEDTIME     traZODone 100 MG tablet  Commonly known as:  DESYREL  Take 100 mg by mouth at bedtime.     vitamin C 500 MG tablet  Commonly known as:  ASCORBIC ACID  Take 500 mg by mouth every morning.         PHYSICAL EXAMINATION  Blood pressure 110/63, pulse 73, temperature 99.3 F (37.4 C), resp. rate 18, height $RemoveBe'5\' 3"'[id number]$  (1.6 m), weight 0 lb (0 kg), SpO2 98 %.  Physical Exam  Constitutional: She is oriented to person, place, and time. Vital signs are normal. She appears unhealthy.  HENT:  Head: Normocephalic and atraumatic.  Mouth/Throat: Oropharynx is clear and moist. No oropharyngeal exudate.  Eyes: Conjunctivae and EOM are normal. Pupils are equal, round, and reactive to light. Right eye exhibits no discharge. Left eye exhibits no discharge. No scleral icterus.  Neck: Normal range of motion. Neck supple. No JVD present. No tracheal deviation present. No thyromegaly present.  Cardiovascular: Normal rate, regular rhythm, normal heart sounds and intact distal  pulses.   Pulmonary/Chest: Effort normal and breath sounds normal. No respiratory distress. She has no wheezes. She has no rales. She exhibits no tenderness.  Abdominal: Soft. Bowel sounds are normal. She exhibits no distension and no mass. There is no tenderness. There is no rebound and no guarding.  Musculoskeletal: Normal range of motion. She exhibits no edema or tenderness.  Left above-the-knee amputation with prosthesis in place.  Lymphadenopathy:    She has no cervical adenopathy.  Neurological: She is alert and oriented to person, place, and time.  Skin: Skin is warm and dry. No rash noted. No erythema. No pallor.  No evidence of injury or trauma on exam.  Psychiatric: Affect normal.  Nursing note and vitals reviewed.   LABORATORY DATA:. Appointment on 05/03/2014  Component Date Value Ref Range Status  . Ferritin 05/03/2014 230  9 - 269 ng/ml Final  . Iron 05/03/2014 105  41 - 142 ug/dL Final  . TIBC 05/03/2014 349  236 - 444 ug/dL Final  . UIBC 05/03/2014 243  120 - 384 ug/dL Final  . %SAT 05/03/2014 30  21 - 57 % Final  . Sodium 05/03/2014 137  136 - 145 mEq/L Final  . Potassium 05/03/2014 4.1  3.5 - 5.1 mEq/L Final  . Chloride 05/03/2014 102  98 - 109 mEq/L Final  . CO2 05/03/2014 25  22 - 29 mEq/L Final  . Glucose 05/03/2014 135  70 - 140 mg/dl Final  . BUN 05/03/2014 15.0  7.0 - 26.0 mg/dL Final  . Creatinine 05/03/2014 0.9  0.6 - 1.1 mg/dL Final  . Total Bilirubin 05/03/2014 0.40  0.20 - 1.20 mg/dL Final  . Alkaline Phosphatase 05/03/2014 88  40 - 150 U/L Final  . AST 05/03/2014 123* 5 - 34 U/L Final  . ALT 05/03/2014 97* 0 - 55 U/L Final  . Total Protein 05/03/2014 6.6  6.4 - 8.3 g/dL  Final  . Albumin 05/03/2014 3.3* 3.5 - 5.0 g/dL Final  . Calcium 05/03/2014 8.9  8.4 - 10.4 mg/dL Final  . Anion Gap 05/03/2014 11  3 - 11 mEq/L Final  . EGFR 05/03/2014 84* >90 ml/min/1.73 m2 Final   eGFR is calculated using the CKD-EPI Creatinine Equation (2009)  . WBC 05/03/2014  4.7  3.9 - 10.3 10e3/uL Final  . NEUT# 05/03/2014 2.9  1.5 - 6.5 10e3/uL Final  . HGB 05/03/2014 10.6* 11.6 - 15.9 g/dL Final  . HCT 05/03/2014 33.3* 34.8 - 46.6 % Final  . Platelets 05/03/2014 182  145 - 400 10e3/uL Final  . MCV 05/03/2014 93.5  79.5 - 101.0 fL Final  . MCH 05/03/2014 29.8  25.1 - 34.0 pg Final  . MCHC 05/03/2014 31.8  31.5 - 36.0 g/dL Final  . RBC 05/03/2014 3.56* 3.70 - 5.45 10e6/uL Final  . RDW 05/03/2014 18.8* 11.2 - 14.5 % Final  . lymph# 05/03/2014 1.1  0.9 - 3.3 10e3/uL Final  . MONO# 05/03/2014 0.6  0.1 - 0.9 10e3/uL Final  . Eosinophils Absolute 05/03/2014 0.1  0.0 - 0.5 10e3/uL Final  . Basophils Absolute 05/03/2014 0.0  0.0 - 0.1 10e3/uL Final  . NEUT% 05/03/2014 61.7  38.4 - 76.8 % Final  . LYMPH% 05/03/2014 24.0  14.0 - 49.7 % Final  . MONO% 05/03/2014 13.0  0.0 - 14.0 % Final  . EOS% 05/03/2014 1.1  0.0 - 7.0 % Final  . BASO% 05/03/2014 0.2  0.0 - 2.0 % Final     RADIOGRAPHIC STUDIES: No results found.  ASSESSMENT/PLAN:    Fatigue Patient is complaining of increased fatigue for the past few weeks.  She feels this is most likely related to her iron deficiency anemia and need for a Feraheme infusion.  However, hemoglobin has increased to 10.4; and ferritin/iron levels are within normal limits.  Patient denies any infection symptoms whatsoever.  Patient denies any other new pain symptoms today.  Advised patient to push fluids and remain as active as possible.  Incisional hernia, without obstruction or gangrene Patient does have a chronic, large abdominal incision hernia.  She has plans to have this surgically repaired per Dr. Rush Farmer the first week of January 2016.  Poor most recent notes-the plan is for patient to receive 1 unit of packed red blood cells approximately one week prior to her abdominal hernia surgery.  Advised patient would make the arrangements for blood transfusion; and let her know the specific details regarding her appointment.  Iron  deficiency anemia due to chronic blood loss Ferritin has increased from 15-230.  Iron has increased from 15-105.  TIBC is currently 349.  Iron saturation has increased from 3-30.  However, patient continues to complain of increased fatigue and some generalized muscle cramping.  She states the muscle cramping always increases when it is time for her to receive a Feraheme infusion.  Patient denies any other new symptoms whatsoever.  She denies any infection symptoms, fever, or chills.  She denies any other new pain symptoms.  Carefully reviewed all lab results with both patient and the physician on call today.  Advised patient that she did not currently need a Feraheme infusion.  Advised that patient follow up here at the [facility] within the next week or so for further evaluation.  Muscle cramps Patient is complaining of some generalized cramping to her extremities; with specific cracking to her bilateral hands.  Complete metabolic panel revealed no specific electrolyte imbalances.  Patient was  encouraged to push fluids is much as possible.  Transaminitis Patient has a history of chronic transaminitis.  Per previous note-the plan is to obtain an annual liver MRI in initiate relation to the liver iron is greater than 3 mg/gram dry weight or for serum ferritin is greater than 1000 on 2 consecutive measurements.  Will continue to monitor closely.  Patient stated understanding of all instructions; and was in agreement with this plan of care. The patient knows to call the clinic with any problems, questions or concerns.   Review/collaboration with Dr. Burr Medico regarding all aspects of patient's visit today.   Total time spent with patient was 40 minutes;  with greater than 80 percent of that time spent in face to face counseling regarding her symptoms, and coordination of care and follow up.  Disclaimer: This note was dictated with voice recognition software. Similar sounding words can inadvertently be  transcribed and may not be corrected upon review.   Drue Second, NP 05/04/2014

## 2014-05-04 NOTE — Assessment & Plan Note (Signed)
Patient is complaining of some generalized cramping to her extremities; with specific cracking to her bilateral hands.  Complete metabolic panel revealed no specific electrolyte imbalances.  Patient was encouraged to push fluids is much as possible.

## 2014-05-04 NOTE — Assessment & Plan Note (Signed)
Patient has a history of chronic transaminitis.  Per previous note-the plan is to obtain an annual liver MRI in initiate relation to the liver iron is greater than 3 mg/gram dry weight or for serum ferritin is greater than 1000 on 2 consecutive measurements.  Will continue to monitor closely.

## 2014-05-04 NOTE — Assessment & Plan Note (Signed)
Ferritin has increased from 15-230.  Iron has increased from 15-105.  TIBC is currently 349.  Iron saturation has increased from 3-30.  However, patient continues to complain of increased fatigue and some generalized muscle cramping.  She states the muscle cramping always increases when it is time for her to receive a Feraheme infusion.  Patient denies any other new symptoms whatsoever.  She denies any infection symptoms, fever, or chills.  She denies any other new pain symptoms.  Carefully reviewed all lab results with both patient and the physician on call today.  Advised patient that she did not currently need a Feraheme infusion.  Advised that patient follow up here at the [facility] within the next week or so for further evaluation.

## 2014-05-07 ENCOUNTER — Encounter: Payer: Medicare Other | Admitting: Internal Medicine

## 2014-05-14 ENCOUNTER — Ambulatory Visit (INDEPENDENT_AMBULATORY_CARE_PROVIDER_SITE_OTHER): Payer: Medicare Other | Admitting: Internal Medicine

## 2014-05-14 ENCOUNTER — Encounter: Payer: Self-pay | Admitting: Internal Medicine

## 2014-05-14 VITALS — BP 109/66 | HR 83 | Temp 98.5°F | Ht 63.0 in | Wt 185.4 lb

## 2014-05-14 DIAGNOSIS — E119 Type 2 diabetes mellitus without complications: Secondary | ICD-10-CM | POA: Diagnosis not present

## 2014-05-14 DIAGNOSIS — T8189XS Other complications of procedures, not elsewhere classified, sequela: Secondary | ICD-10-CM

## 2014-05-14 LAB — GLUCOSE, CAPILLARY: GLUCOSE-CAPILLARY: 99 mg/dL (ref 70–99)

## 2014-05-14 LAB — POCT GLYCOSYLATED HEMOGLOBIN (HGB A1C): Hemoglobin A1C: 5.6

## 2014-05-14 MED ORDER — GLUCOSE BLOOD VI STRP
ORAL_STRIP | Status: DC
Start: 2014-05-14 — End: 2014-09-27

## 2014-05-14 MED ORDER — OXYCODONE-ACETAMINOPHEN 10-325 MG PO TABS: 1.0000 | ORAL_TABLET | Freq: Four times a day (QID) | ORAL | Status: DC | PRN

## 2014-05-14 NOTE — Progress Notes (Signed)
Medicine attending: Medical history, presenting problems, physical findings, and medications, reviewed with Dr Nora Sadek and I concur with her evaluation and management plan. 

## 2014-05-14 NOTE — Assessment & Plan Note (Signed)
>>  ASSESSMENT AND PLAN FOR TYPE 2 DIABETES MELLITUS WITH DIABETIC NEUROPATHY ([facility]) WRITTEN ON 05/14/2014  5:32 PM BY MONTY BAIL, MD  Lab Results  Component Value Date   HGBA1C 5.6 05/14/2014   HGBA1C 6.1 12/18/2013   HGBA1C 6.2 09/18/2013     Assessment: Diabetes control:   Progress toward A1C goal:    Comments: the patient is maintaining excellent control (even taking into consideration of her transfusion dependence her CBG readings have been well controlled)  Plan: Medications:  continue current medications Home glucose monitoring: Frequency:   Timing:   Instruction/counseling given: reminded to bring blood glucose meter & log to each visit, reminded to bring medications to each visit, discussed foot care and discussed diet Educational resources provided:   Self management tools provided:   Other plans: pt seems ready and cleared for surgery in terms of DM management

## 2014-05-14 NOTE — Progress Notes (Signed)
Subjective:   Patient ID: [name] female   DOB: December 07, 1950 63 y.o.   MRN: [id number]  HPI: [name] is a 63 y.o. woman with a past medical history as listed below who comes in for diabetes check.  DM - Patient checking blood sugars daily, before breakfast. Currently taking metformin. No  hypoglycemic episodes since last visit. denies polyuria, polydipsia, nausea, vomiting, diarrhea.  does not request refills today. She recently ran out of her test strips and was unable to pick them up at the pharmacy and that has been her only concern recently. The patient is anxious given her upcoming schedules elective surgery in January 2016.  Past Medical History  Diagnosis Date  . Hypertension   . Colon polyps   . Stomach problems   . Hypokalemic alkalosis   . Allergic rhinitis   . GERD (gastroesophageal reflux disease)   . Stroke 2010    no residual problems  . Anemia, iron deficiency 05/03/2011    recieves iron infusions  . Anemia   . GI bleeding   . Diverticulitis   . Transfusion history   . Difficulty sleeping     takes trazadone for sleep  . Peripheral vascular disease   . Hepatitis     hep C  . Anxiety   . Shortness of breath     due to anemia   Current Outpatient Prescriptions  Medication Sig Dispense Refill  . ACCU-CHEK FASTCLIX LANCETS MISC Check your blood sugar one time a day 102 each 5  . ALREX 0.2 % SUSP     . aspirin EC 81 MG tablet Take 162 mg by mouth 2 (two) times daily.    Marland Kitchen atenolol (TENORMIN) 50 MG tablet Take 50 mg by mouth every morning.     . cetirizine (ZYRTEC) 10 MG tablet Take 10 mg by mouth every morning.     . cetirizine (ZYRTEC) 10 MG tablet TAKE 1 TABLET BY MOUTH DAILY 90 tablet 3  . cyclobenzaprine (FLEXERIL) 10 MG tablet Take 1 tablet (10 mg total) by mouth 2 (two) times daily as needed for muscle spasms. 20 tablet 0  . esomeprazole (NEXIUM) 40 MG capsule Take 40 mg by mouth 2 (two) times daily.    . fluorometholone (FML) 0.1 %  ophthalmic suspension Place 1 drop into the left eye 2 (two) times daily.    Marland Kitchen FLUoxetine (PROZAC) 20 MG tablet Take 40 mg by mouth every morning.     Marland Kitchen FLUoxetine (PROZAC) 40 MG capsule TAKE 1 CAPSULE BY MOUTH DAILY 30 capsule 5  . gabapentin (NEURONTIN) 400 MG capsule Take 800 mg by mouth 3 (three) times daily.    Marland Kitchen gabapentin (NEURONTIN) 400 MG capsule TAKE 2 CAPSULE BY MOUTH THREE TIMES A DAY 180 capsule 3  . glucose blood (ACCU-CHEK SMARTVIEW) test strip Check blood sugar  one time a day 50 each 12  . hydrochlorothiazide (HYDRODIURIL) 25 MG tablet Take 25 mg by mouth every morning.    . lactose free nutrition (BOOST PLUS) LIQD Take 237 mLs by mouth 3 (three) times daily with meals. 237 mL 0  . lisinopril (PRINIVIL,ZESTRIL) 40 MG tablet Take 1 tablet (40 mg total) by mouth every morning. 90 tablet 4  . metFORMIN (GLUCOPHAGE) 500 MG tablet Take 500 mg by mouth daily with breakfast.    . oxyCODONE-acetaminophen (PERCOCET) 10-325 MG per tablet Take 1 tablet by mouth every 6 (six) hours as needed for pain. 120 tablet 0  . potassium chloride 20  MEQ/15ML (10%) SOLN TAKE 15ML BY MOUTH EVERY DAY 450 mL 0  . prednisoLONE acetate (PRED FORTE) 1 % ophthalmic suspension Place 1 drop into the left eye 3 (three) times daily.    Marland Kitchen Specialty Vitamins Products (MAGNESIUM, AMINO ACID CHELATE,) 133 MG tablet Take 1 tablet by mouth 2 (two) times daily.    . traMADol (ULTRAM) 50 MG tablet Take 1-2 tablets (50-100 mg total) by mouth every 6 (six) hours as needed. 30 tablet 1  . traZODone (DESYREL) 100 MG tablet Take 100 mg by mouth at bedtime.    . traZODone (DESYREL) 100 MG tablet TAKE 1 TABLET BY MOUTH EVERY NIGHT AT BEDTIME 30 tablet 11  . vitamin C (ASCORBIC ACID) 500 MG tablet Take 500 mg by mouth every morning.     No current facility-administered medications for this visit.   Family History  Problem Relation Age of Onset  . Cancer Father     unknown  . Hypertension Mother   . Diverticulitis Mother     History   Social History  . Marital Status: Widowed    Spouse Name: N/A    Number of Children: N/A  . Years of Education: N/A   Social History Main Topics  . Smoking status: Current Every Day Smoker -- 0.20 packs/day for 17 years    Types: Cigarettes  . Smokeless tobacco: Never Used     Comment: wants to quit  . Alcohol Use: 0.0 oz/week    0 Not specified per week     Comment: Occasionally  . Drug Use: Yes    Special: Marijuana, "Crack" cocaine     Comment: Thanksgiving and Christmas  Holidays 2014 hasn't had any since  . Sexual Activity: None   Other Topics Concern  . None   Social History Narrative   Review of Systems: Pertinent items are noted in HPI. Objective:  Physical Exam: Filed Vitals:   05/14/14 1547  BP: 109/66  Pulse: 83  Temp: 98.5 F (36.9 C)  TempSrc: Oral  Height: 5\' 3"  (1.6 m)  Weight: 185 lb 6.4 oz (84.097 kg)  SpO2: 100%   General: sitting in chair, NAD Cardiac: RRR, no rubs, murmurs or gallops Pulm: clear to auscultation bilaterally, moving normal volumes of air Abd: soft, nontender, nondistended, BS present Ext: warm and well perfused, no pedal edema, left BKA Neuro: alert and oriented X3, cranial nerves II-XII grossly intact  Assessment & Plan:  Please see problem oriented charting  Pt discussed with Dr. Beryle Beams

## 2014-05-14 NOTE — Patient Instructions (Signed)
General Instructions: You are doing wonderful! Keep up the good work and good luck with surgery!  Please bring your medicines with you each time you come to clinic.  Medicines may include prescription medications, over-the-counter medications, herbal remedies, eye drops, vitamins, or other pills.   Progress Toward Treatment Goals:  Treatment Goal 05/14/2014  Blood pressure -  Stop smoking -  Prevent falls at goal    Self Care Goals & Plans:  Self Care Goal 05/14/2014  Manage my medications take my medicines as prescribed; bring my medications to every visit; refill my medications on time  Monitor my health -  Eat healthy foods drink diet soda or water instead of juice or soda; eat more vegetables; eat foods that are low in salt; eat baked foods instead of fried foods; eat fruit for snacks and desserts  Be physically active -  Stop smoking -  Prevent falls -    No flowsheet data found.   Care Management & Community Referrals:  Referral 05/14/2014  Referrals made for care management support [facility] case management program

## 2014-05-14 NOTE — Assessment & Plan Note (Signed)
Lab Results  Component Value Date   HGBA1C 5.6 05/14/2014   HGBA1C 6.1 12/18/2013   HGBA1C 6.2 09/18/2013     Assessment: Diabetes control:   Progress toward A1C goal:    Comments: the patient is maintaining excellent control (even taking into consideration of her transfusion dependence her CBG readings have been well controlled)  Plan: Medications:  continue current medications Home glucose monitoring: Frequency:   Timing:   Instruction/counseling given: reminded to bring blood glucose meter & log to each visit, reminded to bring medications to each visit, discussed foot care and discussed diet Educational resources provided:   Self management tools provided:   Other plans: pt seems ready and cleared for surgery in terms of DM management

## 2014-05-19 ENCOUNTER — Encounter (HOSPITAL_COMMUNITY): Payer: Self-pay | Admitting: Pharmacy Technician

## 2014-05-25 ENCOUNTER — Telehealth (INDEPENDENT_AMBULATORY_CARE_PROVIDER_SITE_OTHER): Payer: Self-pay

## 2014-05-25 ENCOUNTER — Encounter (HOSPITAL_COMMUNITY)
Admission: RE | Admit: 2014-05-25 | Discharge: 2014-05-25 | Disposition: A | Discharge status: Home / Self Care | Payer: Medicare Other | Source: Ambulatory Visit | Attending: Surgery | Admitting: Surgery

## 2014-05-25 ENCOUNTER — Encounter (HOSPITAL_COMMUNITY): Payer: Self-pay

## 2014-05-25 ENCOUNTER — Telehealth: Payer: Self-pay | Admitting: *Deleted

## 2014-05-25 DIAGNOSIS — K5791 Diverticulosis of intestine, part unspecified, without perforation or abscess with bleeding: Secondary | ICD-10-CM | POA: Diagnosis not present

## 2014-05-25 DIAGNOSIS — K6389 Other specified diseases of intestine: Secondary | ICD-10-CM | POA: Diagnosis not present

## 2014-05-25 DIAGNOSIS — I739 Peripheral vascular disease, unspecified: Secondary | ICD-10-CM | POA: Insufficient documentation

## 2014-05-25 DIAGNOSIS — G510 Bell's palsy: Secondary | ICD-10-CM | POA: Insufficient documentation

## 2014-05-25 DIAGNOSIS — Z8673 Personal history of transient ischemic attack (TIA), and cerebral infarction without residual deficits: Secondary | ICD-10-CM | POA: Diagnosis not present

## 2014-05-25 DIAGNOSIS — I071 Rheumatic tricuspid insufficiency: Secondary | ICD-10-CM | POA: Insufficient documentation

## 2014-05-25 DIAGNOSIS — I10 Essential (primary) hypertension: Secondary | ICD-10-CM | POA: Insufficient documentation

## 2014-05-25 DIAGNOSIS — Z89612 Acquired absence of left leg above knee: Secondary | ICD-10-CM | POA: Insufficient documentation

## 2014-05-25 DIAGNOSIS — B1920 Unspecified viral hepatitis C without hepatic coma: Secondary | ICD-10-CM | POA: Diagnosis not present

## 2014-05-25 DIAGNOSIS — I517 Cardiomegaly: Secondary | ICD-10-CM | POA: Insufficient documentation

## 2014-05-25 DIAGNOSIS — D509 Iron deficiency anemia, unspecified: Secondary | ICD-10-CM | POA: Diagnosis not present

## 2014-05-25 DIAGNOSIS — F17210 Nicotine dependence, cigarettes, uncomplicated: Secondary | ICD-10-CM | POA: Diagnosis not present

## 2014-05-25 DIAGNOSIS — K8020 Calculus of gallbladder without cholecystitis without obstruction: Secondary | ICD-10-CM | POA: Insufficient documentation

## 2014-05-25 DIAGNOSIS — Z9049 Acquired absence of other specified parts of digestive tract: Secondary | ICD-10-CM | POA: Diagnosis not present

## 2014-05-25 DIAGNOSIS — Q2733 Arteriovenous malformation of digestive system vessel: Secondary | ICD-10-CM | POA: Insufficient documentation

## 2014-05-25 DIAGNOSIS — Z8619 Personal history of other infectious and parasitic diseases: Secondary | ICD-10-CM | POA: Diagnosis not present

## 2014-05-25 DIAGNOSIS — Z01818 Encounter for other preprocedural examination: Secondary | ICD-10-CM | POA: Insufficient documentation

## 2014-05-25 DIAGNOSIS — E669 Obesity, unspecified: Secondary | ICD-10-CM | POA: Diagnosis not present

## 2014-05-25 DIAGNOSIS — K219 Gastro-esophageal reflux disease without esophagitis: Secondary | ICD-10-CM | POA: Diagnosis not present

## 2014-05-25 DIAGNOSIS — K432 Incisional hernia without obstruction or gangrene: Secondary | ICD-10-CM | POA: Diagnosis not present

## 2014-05-25 LAB — COMPREHENSIVE METABOLIC PANEL
ALBUMIN: 3.1 g/dL — AB (ref 3.5–5.2)
ALT: 95 U/L — ABNORMAL HIGH (ref 0–35)
ANION GAP: 6 (ref 5–15)
AST: 146 U/L — AB (ref 0–37)
Alkaline Phosphatase: 81 U/L (ref 39–117)
BUN: 15 mg/dL (ref 6–23)
CALCIUM: 8.8 mg/dL (ref 8.4–10.5)
CO2: 25 mmol/L (ref 19–32)
CREATININE: 0.92 mg/dL (ref 0.50–1.10)
Chloride: 107 mEq/L (ref 96–112)
GFR calc Af Amer: 76 mL/min — ABNORMAL LOW (ref >90)
GFR calc non Af Amer: 66 mL/min — ABNORMAL LOW (ref >90)
Glucose, Bld: 204 mg/dL — ABNORMAL HIGH (ref 70–99)
Potassium: 4.1 mmol/L (ref 3.5–5.1)
Sodium: 138 mmol/L (ref 135–145)
Total Bilirubin: 0.4 mg/dL (ref 0.3–1.2)
Total Protein: 6.5 g/dL (ref 6.0–8.3)

## 2014-05-25 LAB — CBC
HCT: 30.1 % — ABNORMAL LOW (ref 36.0–46.0)
Hemoglobin: 9.9 g/dL — ABNORMAL LOW (ref 12.0–15.0)
MCH: 30.7 pg (ref 26.0–34.0)
MCHC: 32.9 g/dL (ref 30.0–36.0)
MCV: 93.5 fL (ref 78.0–100.0)
PLATELETS: 209 10*3/uL (ref 150–400)
RBC: 3.22 MIL/uL — AB (ref 3.87–5.11)
RDW: 15.2 % (ref 11.5–15.5)
WBC: 3.6 10*3/uL — ABNORMAL LOW (ref 4.0–10.5)

## 2014-05-25 NOTE — Telephone Encounter (Signed)
Please review labs on [name]. She is scheduled for Surg on 06/01/2014.

## 2014-05-25 NOTE — Progress Notes (Addendum)
Anesthesia Chart Review:  Patient is a 63 year old female scheduled for laparoscopic, possible open incisional hernia repair on 06/01/14 by Dr. Coralie Keens. Surgery was initially scheduled for 06/04/13 but was postponed due to hyperglycemia and anemia (see my note from 06/02/13).  History includes recurrent iron deficiency anemia secondary to GI bleed due to AVM and diverticulosis, left AKA due to necrotizing fascitis 02/2009, hepatitis C, GERD, HTN, PVD (not specified), smoking, obesity, CVA '10, right Bell's Palsy, partial colectomy for chronic lower GI bleed followed by exploratory laparotomy and over-sewing of colonic anastomosis for persistent post-operative rectal bleeding 01/2012.   PCP is with [facility], last visit 05/14/14 with Dr. Ellwood Dense.  Notes indicate that Dr. Ellwood Dense was aware of plans for surgery in 05/2014 and cleared patient in terms of her DM management. She has also seen hematology at [facility] for iron deficiency anemia and by GI Dr. Olegario Messier at [facility] for AVM ablation. (Previously saw Dr. Benson Norway, but discharged from his practice.)  Her last visit with hematology was with Drue Second, NP on 05/04/14.  Patient was undergoing Feraheme infusions. In regards to her transaminitis, "Per previous note-the plan is to obtain an annual liver MRI in initiate relation to the liver iron is greater than 3 mg/gram dry weight or for serum ferritin is greater than 1000 on 2 consecutive measurements. Will continue to monitor closely."  EKG on 03/19/14 showed SR with first degree AVB.  Echo on 02/22/10 showed: - Left ventricle: The cavity size was normal. Wall thickness was increased in a pattern of moderate LVH. The estimated ejection fraction was 60%. Wall motion was normal; there were no regional wall motion abnormalities. - Left atrium: The atrium was mildly dilated. - Right ventricle: The cavity size was mildly dilated. Systolic function was mildly reduced. - Right atrium: The  atrium was mildly dilated. - Tricuspid valve: Mild regurgitation. - Pulmonary arteries: PA peak pressure: 59mm Hg (S).  02/09/13 abdominal ultrasound:  IMPRESSION: 1. Gallstones and sludge. 2. No other evidence for acute cholecystitis. 3. Normal appearance of the kidneys and liver.  11/28/11 MRI of the abdomen: IMPRESSION: 1. MR findings consistent with secondary or transfusional hemochromatosis involving the liver, spleen, adrenal glands and bone marrow. 2. No findings for cirrhosis, portal hypertension, splenomegaly or ascites. 3. No worrisome enhancing liver lesions.  CXR on 06/02/13 showed: There is no evidence of pneumonia nor CHF. Mild hyperinflation may be voluntary or could reflect underlying COPD.  Preoperative labs noted.  H/H 9.9/30.1, which appears stable. Non-fasting glucose 204 (reported fasting ~ 120-125 with A1C of 5.6 on 05/24/14). AST was 146 (up from 123 on 05/03/14 with previous range of 48-190 since 06/02/12) and ALT 95 (down from 97 on 05/03/14 with previous range of 27-167 since 06/02/12).  Discussed labs with anesthesiologist Dr. Linna Caprice.  Patient with known hepatitis C and routine iron infusion and has had rather chronically elevated AST/ALT over the past two years, sometimes worse than others.  Her total bilirubin and PLT count have been normal.  Plan to repeat HFP and get PT/PTT on the day of surgery.  If HFP results are stable and coags acceptable then it is anticipated that she can proceed as planned.  I'll also order a Hold Clot specimen to Blood Bank in case Dr. Ninfa Linden or her anesthesiologist would like a T&S due to her underlying anemia.   [name] [facility] Short Stay Center/Anesthesiology Phone [phone] 05/25/2014 4:18 PM

## 2014-05-25 NOTE — Telephone Encounter (Signed)
[facility] calls today and ask for a copy of a letter for medial clearance, she is to have surgery 1/05, i have reviewed the chart and ask him also to call the cancer ctr to bacon NP r/t blood transfusion. Please review and write a letter, we will fax it if needed

## 2014-05-25 NOTE — Pre-Procedure Instructions (Signed)
[name]  05/25/2014   Your procedure is scheduled on:  Tuesday, Jan. 5th   Report to [facility] Admitting at  6:30 AM.   Call this number if you have problems the morning of surgery: [phone]   Remember:   Do not eat food or drink liquids after midnight Monday.   Take these medicines the morning of surgery with A SIP OF WATER: Atenolol, Zyrtec, Nexium, Prozac, Oxycodone(if needed)   Do not wear jewelry, make-up or nail polish.  Do not wear lotions, powders, or perfumes. You may NOT wear deodorant.  Do not shave underarms & legs 48 hours prior to surgery.    Do not bring valuables to the hospital.  [facility] is not responsible for any belongings or valuables.               Contacts, dentures or bridgework may not be worn into surgery.  Leave suitcase in the car. After surgery it may be brought to your room.  For patients admitted to the hospital, discharge time is determined by your treatment team.               Patients discharged the day of surgery will not be allowed to drive home, and will need someone to stay with them for 24 hrs afterwards.   Name and phone number of your driver:    Special Instructions: "Preparing for Surgery" instruction sheet.   Please read over the following fact sheets that you were given: Pain Booklet and Surgical Site Infection Prevention

## 2014-05-25 NOTE — Progress Notes (Addendum)
Spoke with Larkin Ina over [facility], requesting medical clearance note from her PCP.  He is looking in to it.  DA Spoke with Ms. Okelly's caregiver, DeeDe.  She states that patient's blood sugars have been running 120-125 in the mornings.   DA Hgb is 9.9   I spoke with [place] @ [place].  He will make Dr. Ninfa Linden aware.  (Dr. Ninfa Linden won't be back in office until the 4th.-- on vacation)

## 2014-05-25 NOTE — Progress Notes (Signed)
   05/25/14 0825  OBSTRUCTIVE SLEEP APNEA  Have you ever been diagnosed with sleep apnea through a sleep study? No  Do you snore loudly (loud enough to be heard through closed doors)?  0  Do you often feel tired, fatigued, or sleepy during the daytime? 1 (d/t medication)  Has anyone observed you stop breathing during your sleep? 0  Do you have, or are you being treated for high blood pressure? 1  BMI more than 35 kg/m2? 1  Age over 61 years old? 1  Neck circumference greater than 40 cm/16 inches? 0  Gender: 0  Obstructive Sleep Apnea Score 4  Score 4 or greater  Results sent to PCP

## 2014-05-27 ENCOUNTER — Ambulatory Visit: Payer: Medicare Other | Admitting: Hematology

## 2014-05-27 ENCOUNTER — Ambulatory Visit (HOSPITAL_BASED_OUTPATIENT_CLINIC_OR_DEPARTMENT_OTHER): Payer: Medicare Other | Admitting: Hematology

## 2014-05-27 ENCOUNTER — Other Ambulatory Visit: Payer: Medicare Other

## 2014-05-27 ENCOUNTER — Encounter: Payer: Self-pay | Admitting: Hematology

## 2014-05-27 ENCOUNTER — Telehealth: Payer: Self-pay | Admitting: Hematology

## 2014-05-27 VITALS — BP 112/55 | HR 76 | Temp 98.2°F | Resp 18 | Ht 63.0 in | Wt 188.2 lb

## 2014-05-27 DIAGNOSIS — E119 Type 2 diabetes mellitus without complications: Secondary | ICD-10-CM | POA: Diagnosis not present

## 2014-05-27 DIAGNOSIS — K432 Incisional hernia without obstruction or gangrene: Secondary | ICD-10-CM | POA: Diagnosis not present

## 2014-05-27 DIAGNOSIS — B182 Chronic viral hepatitis C: Secondary | ICD-10-CM

## 2014-05-27 DIAGNOSIS — D500 Iron deficiency anemia secondary to blood loss (chronic): Secondary | ICD-10-CM | POA: Diagnosis not present

## 2014-05-27 DIAGNOSIS — R740 Nonspecific elevation of levels of transaminase and lactic acid dehydrogenase [LDH]: Secondary | ICD-10-CM

## 2014-05-27 NOTE — Progress Notes (Signed)
[place] OFFICE PROGRESS NOTE   Patricia Gallant, MD [address] [place] [address]  DIAGNOSIS: Iron deficiency anemia due to chronic blood loss - Plan: CBC & Diff and Retic, Comprehensive metabolic panel (Cmet) - [facility], Iron and TIBC [facility], Ferritin  Chronic hepatitis C without hepatic coma - Plan: CBC & Diff and Retic, Comprehensive metabolic panel (Cmet) - [facility], Iron and TIBC [facility], Ferritin, Hepatitis C RNA quantitative. Her last visit was with Dr [name] on 01/15/2014.  PROBLEM LIST:  1. Recurrent iron-deficiency anemia secondary to gastrointestinal bleeding (as noted above). [name] also has required red cell transfusions in the past, most recently early December 2012. She underwent a small bowel enteroscopy by Dr. Dan [name] at [facility] on 08/13/2011. The enteroscope was passed up to 6 feet into the jejunum. He saw multiple AVMs, which were ablated. Two of the large AVMs bled during APC. These were in the 4th part of the duodenum very close to the ligament of Treitz. The remainder of the AVMs were in the proximal jejunum. In addition to the small bowel AV malformations, which were treated with APC, he saw some small esophageal varices which were not bleeding and also evidence of mild portal hypertensive gastropathy.  2. History of duodenal arteriovenous malformation.  3. History of hepatitis C infection.  4. Gastroesophageal reflux disease.  5. Depression.  6. Hypertension.  7. History of right Bell's palsy.  8. Status post left above-knee amputation secondary to necrotizing fasciitis 03/06/2009.  9. Neurodermatitis.  10. Previous history of alcohol usage.  11. Admission to the hospital from 12/20/2011 through 12/25/2011 for hematochezia felt to be secondary to a diverticular bleed, requiring 2 units of packed red cells.  12. Diverticulosis.  13. Elevated alpha fetoprotein, 72.3 on 11/19/2011 and 76.0 on 01/11/2012.    14. Abnormal MRI of the liver from 11/28/2011 showing diffuse and markedly low signal intensity throughout the liver as well as diffuse low signal intensity in the adrenal glands. 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 There were no findings for cirrhosis, portal hypertension, splenomegaly or ascites, not were there any worrisome enhancing liver lesions.  15. Systolic ejection murmur.   Chief Complaint  Patient presents with  . Follow-up   PRIOR TREATMENT: The patient has been heavily dependent upon IV Feraheme. Over the past year or so she has been receiving about 1 Feraheme infusion monthly on average. The patient's most recent infusions of IV Feraheme 510 mg occurred on the following dates: 06/05/2011, 07/03/2011, 07/31/2011, 08/17/2011, 08/28/2011. The patient received IV Feraheme 1020 mg on 10/02/2011 01/30/2012, 06/06/2012 and 09/10/2012, 05/2013, 07/2013, 08/2013, 09/2013, 11/12/2013, 01/15/2014.  CURRENT TREATMENT: As noted above. Feraheme 1020 mg prn.   INTERVAL HISTORY:  [name] 63 y.o. female with history of recurrent iron-deficiency anemia due to GI bleeding is here for follow-up.  She was last seen by Dr. Juliann [name] 4 months ago, who has left the practice.  GI bleeding is probably due to AV malformations and diverticulosis.  We have been following her CBC and ferritin on a monthly basis except for the time when she was hospitalized. Since August 2013, the patient required IV Feraheme 1020 as noted above. Her last blood transfusion was about 2 months ago.  Today, the patient is here in a wheelchair but gets around at home with a walker. She lives alone. She reports chronic fatigue but denies chest pain.  She did request to be  seen by our symptom management clinic about 3-4 weeks ago.  She denies any hematochezia or melana. She has undergone a left above-the-knee amputation due to necrotizing fasciitis in October 2010.   She is scheduled for  abdominal hernia repairment next Tuesday. She feels about the same as last visit. She still has moderate fatigue, but no chest pain or other new symptoms.   MEDICAL HISTORY: Past Medical History  Diagnosis Date  . Hypertension   . Colon polyps   . Stomach problems   . Hypokalemic alkalosis   . Allergic rhinitis   . GERD (gastroesophageal reflux disease)   . Stroke 2010    no residual problems  . Anemia, iron deficiency 05/03/2011    recieves iron infusions  . Anemia   . GI bleeding   . Diverticulitis   . Transfusion history   . Difficulty sleeping     takes trazadone for sleep  . Peripheral vascular disease   . Hepatitis     hep C  . Anxiety   . Shortness of breath     due to anemia     ALLERGIES:  is allergic to ciprofloxacin; morphine and related; penicillins; codeine; and feraheme.  MEDICATIONS: has a current medication list which includes the following prescription(s): accu-chek fastclix lancets, alrex, aspirin ec, atenolol, cetirizine, cyclobenzaprine, esomeprazole, fluorometholone, fluoxetine, gabapentin, glucose blood, hydrochlorothiazide, lisinopril, metformin, oxycodone-acetaminophen, potassium chloride, prednisolone acetate, magnesium (amino acid chelate), tramadol, trazodone, vitamin c, and lactose free nutrition.  SURGICAL HISTORY:  Past Surgical History  Procedure Laterality Date  . Leg amputation above knee    . Esophagogastroduodenoscopy  05/05/2011    Procedure: ESOPHAGOGASTRODUODENOSCOPY (EGD);  Surgeon: Patricia Jarred, MD;  Location: [facility];  Service: Gastroenterology;  Laterality: N/A;  Dr. Hilarie [name] will do procedure for Dr. Benson [name] Saturday.  . Esophagogastroduodenoscopy  05/08/2011    Procedure: ESOPHAGOGASTRODUODENOSCOPY (EGD);  Surgeon: [name];  Location: [facility];  Service: Endoscopy;  Laterality: N/A;  . Esophagogastroduodenoscopy  06/07/2011    Procedure: ESOPHAGOGASTRODUODENOSCOPY (EGD);  Surgeon: Patricia Beams, MD;  Location: [facility];   Service: Endoscopy;  Laterality: N/A;  . Hot hemostasis  06/07/2011    Procedure: HOT HEMOSTASIS (ARGON PLASMA COAGULATION/BICAP);  Surgeon: Patricia Beams, MD;  Location: [facility];  Service: Endoscopy;  Laterality: N/A;  . Esophagogastroduodenoscopy  12/20/2011    Procedure: ESOPHAGOGASTRODUODENOSCOPY (EGD);  Surgeon: Patricia Beams, MD;  Location: [facility];  Service: Endoscopy;  Laterality: N/A;  . Flexible sigmoidoscopy  12/21/2011    Procedure: FLEXIBLE SIGMOIDOSCOPY;  Surgeon: Patricia Beams, MD;  Location: [facility];  Service: Endoscopy;  Laterality: N/A;  . Carpal tunnel release      rt hand  . Partial colectomy  02/15/2012    Procedure: PARTIAL COLECTOMY;  Surgeon: Harl Bowie, MD;  Location: [facility];  Service: General;  Laterality: N/A;  . Laparotomy  02/16/2012    Procedure: EXPLORATORY LAPAROTOMY;  Surgeon: Edward Jolly, MD;  Location: [facility];  Service: General;  Laterality: N/A;  oversewing of anastomotic leak and rigid sigmoidoscopy  . Enteroscopy N/A 12/04/2012    Procedure: ENTEROSCOPY;  Surgeon: Patricia Beams, MD;  Location: [facility];  Service: Endoscopy;  Laterality: N/A;  . Enteroscopy N/A 12/18/2012    Procedure: ENTEROSCOPY;  Surgeon: Patricia Beams, MD;  Location: [facility];  Service: Endoscopy;  Laterality: N/A;  . Eye surgery Bilateral     cataracts  . Tonsillectomy    . Abdominal hysterectomy      REVIEW OF  SYSTEMS:   Constitutional: Denies fevers, chills or abnormal weight loss; Reports mild fatigue.  Eyes: Denies blurriness of vision Ears, nose, mouth, throat, and face: Denies mucositis or sore throat Respiratory: Denies cough, dyspnea or wheezes Cardiovascular: Denies palpitation, chest discomfort or lower extremity swelling Gastrointestinal:  Denies nausea, heartburn or change in bowel habits, hernia is painful and bleeds sometime the overlying skin. Skin: Denies abnormal skin rashes Lymphatics: Denies new lymphadenopathy or easy  bruising Neurological:Denies numbness, tingling or new weaknesses Behavioral/Psych: Mood is stable, no new changes  All other systems were reviewed with the patient and are negative.  PHYSICAL EXAMINATION: ECOG PERFORMANCE STATUS: 2-3  Blood pressure 112/55, pulse 76, temperature 98.2 F (36.8 C), temperature source Oral, resp. rate 18, height 5\' 3"  (1.6 m), weight 188 lb 3.2 oz (85.367 kg), SpO2 100 %.  General appearance: alert, appears stated age, no distress and moderately obese  Head: Normocephalic, without obvious abnormality, atraumatic  Neck: no adenopathy, supple, symmetrical, trachea midline and thyroid not enlarged, symmetric, no tenderness/mass/nodules  Lymph nodes: Cervical adenopathy: None appreciated  Heart:regular rate and rhythm, S1, S2 normal and systolic murmur: systolic ejection 2/6, harsh and previously documented without radiation  Lung:chest clear, no wheezing, rales, normal symmetric air entry, Heart exam - S1, S2 normal, no murmur, no gallop, rate regular  Abdomin: soft, distended, normal bowel sounds, nontender and incisional hernia and easily reducible and nontender  EXT:No peripheral edema on right extremity; Left above the knee amputation. She is wearing a prothesis.   Labs:  CBC Latest Ref Rng 05/25/2014 05/03/2014 03/15/2014  WBC 4.0 - 10.5 K/uL 3.6(L) 4.7 3.6(L)  Hemoglobin 12.0 - 15.0 g/dL 9.9(L) 10.6(L) 7.4(L)  Hematocrit 36.0 - 46.0 % 30.1(L) 33.3(L) 24.2(L)  Platelets 150 - 400 K/uL 209 182 274    CMP Latest Ref Rng 05/25/2014 05/03/2014 03/15/2014  Glucose 70 - 99 mg/dL 204(H) 135 150(H)  BUN 6 - 23 mg/dL 15 15.0 18.7  Creatinine 0.50 - 1.10 mg/dL 0.92 0.9 1.1  Sodium 135 - 145 mmol/L 138 137 136  Potassium 3.5 - 5.1 mmol/L 4.1 4.1 4.6  Chloride 96 - 112 mEq/L 107 - -  CO2 19 - 32 mmol/L 25 25 23   Calcium 8.4 - 10.5 mg/dL 8.8 8.9 8.8  Total Protein 6.0 - 8.3 g/dL 6.5 6.6 -  Total Bilirubin 0.3 - 1.2 mg/dL 0.4 0.40 -  Alkaline Phos 39 - 117 U/L  81 88 -  AST 0 - 37 U/L 146(H) 123(H) -  ALT 0 - 35 U/L 95(H) 97(H) -   Ferritin  Status: Finalresult Visible to patient:  Not Released Nextappt: 06/28/2014 at 03:30 PM in Oncology ([facility] ONLY) Dx:  Iron deficiency anemia due to chronic...           Ref Range 3wk ago  58mo ago  15mo ago  22mo ago     Ferritin 9 - 269 ng/ml 230 15 417 (H) 11      Iron and TIBC [facility]  Status: Finalresult Visible to patient:  Not Released Nextappt: 06/28/2014 at 03:30 PM in Oncology ([facility] ONLY) Dx:  Iron deficiency anemia due to chronic...           Ref Range 3wk ago  50mo ago  90mo ago  27mo ago     Iron 41 - 142 ug/dL 105 15 (L) 73 21 (L)    TIBC 236 - 444 ug/dL 349 479 (H) 368 538 (H)    UIBC 120 - 384 ug/dL 243  464 (H) 298 517 (H)    %SAT 21 - 57 % 30 3 (L) 19 (L) 4 (L)        RADIOGRAPHIC STUDIES: Mm Digital Screening Bilateral  07/31/2013   CLINICAL DATA:  Screening.  EXAM: DIGITAL SCREENING BILATERAL MAMMOGRAM WITH CAD  COMPARISON:  Previous exam(s).  ACR Breast Density Category c: The breast tissue is heterogeneously dense, which may obscure small masses.  FINDINGS: There are no findings suspicious for malignancy. Images were processed with CAD.  IMPRESSION: No mammographic evidence of malignancy. A result letter of this screening mammogram will be mailed directly to the patient.  RECOMMENDATION: Screening mammogram in one year. (Code:SM-B-01Y)  BI-RADS CATEGORY  1: Negative.   Electronically Signed   By: Abelardo Diesel M.D.   On: 07/31/2013 09:01   ASSESSMENT: [name] 63 y.o. female with a history of Iron deficiency anemia due to chronic blood loss - Plan: CBC & Diff and Retic, Comprehensive metabolic panel (Cmet) - [facility], Iron and TIBC [facility], Ferritin  Chronic hepatitis C without hepatic coma - Plan: CBC & Diff and Retic, Comprehensive metabolic panel (Cmet) - [facility], Iron and TIBC [facility], Ferritin, Hepatitis C RNA  quantitative   PLAN:  1. Iron deficiency anemia secondary to GI lost.  - her hemoglobin is 9.9 today. She would need blood transfusion peri-op next week. I suggest her to get blood transfusion during her hospital stay, since I am not able to arrange her blood transfusion before her surgery. She agrees. -Her iron level was adequate to 3 weeks ago, we'll repeat iron study on next visit and consider Feraheme if her ferritin level drops to low again.  -We'll continue monitor her blood counts and ferritin every months.   2. Elevated transiminases, likely secondary to her chronic hepatitis C  -She was tested positive for hepatitis C in 2009. She has not been treated for hepatitis C, and does not follow with hepatologist. -I'll check her various level, continue monitoring her liver enzymes. If her hepatitis C virus level is high, I would refer her to see a hematologist for treatment.  3. Large incisional hernia. --She is scheduled for a hernia repair next Tuesday.  4. DM2. --Management per PCP's office.   Plan  -Consider blood transfusion postop -Check a CBC and ferritin every months, consider Feraheme if ferritin level low -Return to clinic in 1 month for follow-up.  All questions were answered. The patient knows to call the clinic with any problems, questions or concerns. We can certainly see the patient much sooner if necessary.  I spent 20 minutes counseling the patient face to face. The total time spent in the appointment was 25 minutes.  Truitt Merle 05/27/2014

## 2014-05-27 NOTE — Telephone Encounter (Signed)
gv pt appt schedule for feb 2016 °

## 2014-05-31 MED ORDER — VANCOMYCIN HCL IN DEXTROSE 1-5 GM/200ML-% IV SOLN
1000.0000 mg | INTRAVENOUS | Status: AC
  Administered 2014-06-01: 1000 mg via INTRAVENOUS
  Filled 2014-05-31: qty 200

## 2014-05-31 NOTE — H&P (Signed)
[name] is an 64 y.o. female.   Chief Complaint: incisional HPI: This is a pleasant female with multiple chronic medical problems as well as an increasing symptomatic, large incisional hernia.  She complains of pain as well as occasional nausea and vomiting.  She now presents for elective repair of the hernia.  Past Medical History  Diagnosis Date  . Hypertension   . Colon polyps   . Stomach problems   . Hypokalemic alkalosis   . Allergic rhinitis   . GERD (gastroesophageal reflux disease)   . Stroke 2010    no residual problems  . Anemia, iron deficiency 05/03/2011    recieves iron infusions  . Anemia   . GI bleeding   . Diverticulitis   . Transfusion history   . Difficulty sleeping     takes trazadone for sleep  . Peripheral vascular disease   . Hepatitis     hep C  . Anxiety   . Shortness of breath     due to anemia    Past Surgical History  Procedure Laterality Date  . Leg amputation above knee    . Esophagogastroduodenoscopy  05/05/2011    Procedure: ESOPHAGOGASTRODUODENOSCOPY (EGD);  Surgeon: Patricia Jarred, MD;  Location: [facility];  Service: Gastroenterology;  Laterality: N/A;  Dr. Hilarie [name] will do procedure for Dr. Benson [name] Saturday.  . Esophagogastroduodenoscopy  05/08/2011    Procedure: ESOPHAGOGASTRODUODENOSCOPY (EGD);  Surgeon: [name];  Location: [facility];  Service: Endoscopy;  Laterality: N/A;  . Esophagogastroduodenoscopy  06/07/2011    Procedure: ESOPHAGOGASTRODUODENOSCOPY (EGD);  Surgeon: Patricia Beams, MD;  Location: [facility];  Service: Endoscopy;  Laterality: N/A;  . Hot hemostasis  06/07/2011    Procedure: HOT HEMOSTASIS (ARGON PLASMA COAGULATION/BICAP);  Surgeon: Patricia Beams, MD;  Location: [facility];  Service: Endoscopy;  Laterality: N/A;  . Esophagogastroduodenoscopy  12/20/2011    Procedure: ESOPHAGOGASTRODUODENOSCOPY (EGD);  Surgeon: Patricia Beams, MD;  Location: [facility];  Service: Endoscopy;  Laterality: N/A;  . Flexible  sigmoidoscopy  12/21/2011    Procedure: FLEXIBLE SIGMOIDOSCOPY;  Surgeon: Patricia Beams, MD;  Location: [facility];  Service: Endoscopy;  Laterality: N/A;  . Carpal tunnel release      rt hand  . Partial colectomy  02/15/2012    Procedure: PARTIAL COLECTOMY;  Surgeon: Patricia Bowie, MD;  Location: [facility];  Service: General;  Laterality: N/A;  . Laparotomy  02/16/2012    Procedure: EXPLORATORY LAPAROTOMY;  Surgeon: Patricia Jolly, MD;  Location: [facility];  Service: General;  Laterality: N/A;  oversewing of anastomotic leak and rigid sigmoidoscopy  . Enteroscopy N/A 12/04/2012    Procedure: ENTEROSCOPY;  Surgeon: Patricia Beams, MD;  Location: [facility];  Service: Endoscopy;  Laterality: N/A;  . Enteroscopy N/A 12/18/2012    Procedure: ENTEROSCOPY;  Surgeon: Patricia Beams, MD;  Location: [facility];  Service: Endoscopy;  Laterality: N/A;  . Eye surgery Bilateral     cataracts  . Tonsillectomy    . Abdominal hysterectomy      Family History  Problem Relation Age of Onset  . Cancer Father     unknown  . Hypertension Mother   . Diverticulitis Mother    Social History:  reports that she has been smoking Cigarettes.  She has a 3.4 pack-year smoking history. She has never used smokeless tobacco. She reports that she drinks alcohol. She reports that she uses illicit drugs (Marijuana and "Crack" cocaine).  Allergies:  Allergies  Allergen Reactions  .  Ciprofloxacin Hives and Swelling  . Morphine And Related Other (See Comments)    Makes pt sad and paranoid  . Penicillins Hives and Swelling  . Codeine Hives and Swelling  . Feraheme [Ferumoxytol] Itching    Tolerated Feraheme 6/26 & 7/21 pre-medications prior to medication    No prescriptions prior to admission    No results found for this or any previous visit (from the past 96 hour(s)). No results found.  Review of Systems  All other systems reviewed and are negative.   There were no vitals taken for this  visit. Physical Exam  Constitutional: She is oriented to person, place, and time. She appears well-developed and well-nourished. No distress.  In a wheel chair  HENT:  Head: Normocephalic and atraumatic.  Right Ear: External ear normal.  Left Ear: External ear normal.  Eyes: Conjunctivae are normal. Pupils are equal, round, and reactive to light. Right eye exhibits no discharge. Left eye exhibits no discharge. No scleral icterus.  Neck: Normal range of motion. Neck supple. No tracheal deviation present.  Cardiovascular: Normal rate and regular rhythm.   Murmur heard. Respiratory: Breath sounds normal. No respiratory distress.  GI: Soft. She exhibits no distension. There is no tenderness.  Large, soft incisional hernia  Musculoskeletal: She exhibits edema. She exhibits no tenderness.  Neurological: She is alert and oriented to person, place, and time.  Skin: Skin is warm and dry.  Psychiatric: Her behavior is normal. Judgment normal.     Assessment/Plan Large incisional hernia  She is becoming increasing symptomatic and is really wanting her hernia repaired despite her co-morbidities and risks.  She will need transfusion of PRBC's post op because of her chronic anemia.  I again discussed her risks of laparoscopic/possible incisional hernia repair with mesh in detail.  Theses include but are not limited to bleeding, infection, injury to surrounding structures including the bowel, conversion to an open procedure, recurrence, cardiopulmonary problems including MI, prolonged post op recovery, etc.  She wishes to proceed with surgery  [name] A 05/31/2014, 8:06 PM

## 2014-06-01 ENCOUNTER — Encounter (HOSPITAL_COMMUNITY)
Admission: RE | Disposition: A | Discharge status: Home / Self Care | Payer: Self-pay | Source: Ambulatory Visit | Attending: Surgery

## 2014-06-01 ENCOUNTER — Encounter (HOSPITAL_COMMUNITY): Payer: Self-pay | Admitting: Certified Registered Nurse Anesthetist

## 2014-06-01 ENCOUNTER — Inpatient Hospital Stay (HOSPITAL_COMMUNITY)
Admission: RE | Admit: 2014-06-01 | Discharge: 2014-06-08 | DRG: 355 | Disposition: A | Discharge status: Home / Self Care | Payer: Medicare Other | Source: Ambulatory Visit | Attending: Surgery | Admitting: Surgery

## 2014-06-01 ENCOUNTER — Ambulatory Visit (HOSPITAL_COMMUNITY): Payer: Medicare Other | Admitting: Anesthesiology

## 2014-06-01 ENCOUNTER — Ambulatory Visit (HOSPITAL_COMMUNITY): Payer: Medicare Other | Admitting: Vascular Surgery

## 2014-06-01 DIAGNOSIS — K660 Peritoneal adhesions (postprocedural) (postinfection): Secondary | ICD-10-CM | POA: Diagnosis present

## 2014-06-01 DIAGNOSIS — Z86010 Personal history of colonic polyps: Secondary | ICD-10-CM | POA: Diagnosis not present

## 2014-06-01 DIAGNOSIS — Z881 Allergy status to other antibiotic agents status: Secondary | ICD-10-CM | POA: Diagnosis not present

## 2014-06-01 DIAGNOSIS — D500 Iron deficiency anemia secondary to blood loss (chronic): Secondary | ICD-10-CM | POA: Diagnosis present

## 2014-06-01 DIAGNOSIS — F17210 Nicotine dependence, cigarettes, uncomplicated: Secondary | ICD-10-CM | POA: Diagnosis present

## 2014-06-01 DIAGNOSIS — F1410 Cocaine abuse, uncomplicated: Secondary | ICD-10-CM | POA: Diagnosis present

## 2014-06-01 DIAGNOSIS — Z880 Allergy status to penicillin: Secondary | ICD-10-CM | POA: Diagnosis not present

## 2014-06-01 DIAGNOSIS — Z885 Allergy status to narcotic agent status: Secondary | ICD-10-CM

## 2014-06-01 DIAGNOSIS — E876 Hypokalemia: Secondary | ICD-10-CM | POA: Diagnosis not present

## 2014-06-01 DIAGNOSIS — F419 Anxiety disorder, unspecified: Secondary | ICD-10-CM | POA: Diagnosis present

## 2014-06-01 DIAGNOSIS — F1290 Cannabis use, unspecified, uncomplicated: Secondary | ICD-10-CM | POA: Diagnosis present

## 2014-06-01 DIAGNOSIS — K219 Gastro-esophageal reflux disease without esophagitis: Secondary | ICD-10-CM | POA: Diagnosis present

## 2014-06-01 DIAGNOSIS — I959 Hypotension, unspecified: Secondary | ICD-10-CM | POA: Diagnosis present

## 2014-06-01 DIAGNOSIS — I10 Essential (primary) hypertension: Secondary | ICD-10-CM | POA: Diagnosis present

## 2014-06-01 DIAGNOSIS — I739 Peripheral vascular disease, unspecified: Secondary | ICD-10-CM | POA: Diagnosis present

## 2014-06-01 DIAGNOSIS — Z90710 Acquired absence of both cervix and uterus: Secondary | ICD-10-CM | POA: Diagnosis not present

## 2014-06-01 DIAGNOSIS — B1920 Unspecified viral hepatitis C without hepatic coma: Secondary | ICD-10-CM | POA: Diagnosis present

## 2014-06-01 DIAGNOSIS — K432 Incisional hernia without obstruction or gangrene: Principal | ICD-10-CM | POA: Diagnosis present

## 2014-06-01 DIAGNOSIS — K7469 Other cirrhosis of liver: Secondary | ICD-10-CM | POA: Diagnosis present

## 2014-06-01 DIAGNOSIS — G479 Sleep disorder, unspecified: Secondary | ICD-10-CM | POA: Diagnosis present

## 2014-06-01 DIAGNOSIS — Z8673 Personal history of transient ischemic attack (TIA), and cerebral infarction without residual deficits: Secondary | ICD-10-CM | POA: Diagnosis not present

## 2014-06-01 DIAGNOSIS — D509 Iron deficiency anemia, unspecified: Secondary | ICD-10-CM | POA: Diagnosis not present

## 2014-06-01 DIAGNOSIS — R0602 Shortness of breath: Secondary | ICD-10-CM | POA: Diagnosis not present

## 2014-06-01 DIAGNOSIS — J309 Allergic rhinitis, unspecified: Secondary | ICD-10-CM | POA: Diagnosis present

## 2014-06-01 DIAGNOSIS — E873 Alkalosis: Secondary | ICD-10-CM | POA: Diagnosis not present

## 2014-06-01 HISTORY — PX: INSERTION OF MESH: SHX5868

## 2014-06-01 HISTORY — PX: INCISIONAL HERNIA REPAIR: SHX193

## 2014-06-01 LAB — GLUCOSE, CAPILLARY
Glucose-Capillary: 149 mg/dL — ABNORMAL HIGH (ref 70–99)
Glucose-Capillary: 221 mg/dL — ABNORMAL HIGH (ref 70–99)
Glucose-Capillary: 185 mg/dL — ABNORMAL HIGH (ref 70–99)
Glucose-Capillary: 180 mg/dL — ABNORMAL HIGH (ref 70–99)

## 2014-06-01 LAB — PROTIME-INR
INR: 1.1 (ref 0.00–1.49)
Prothrombin Time: 14.4 seconds (ref 11.6–15.2)

## 2014-06-01 LAB — APTT: aPTT: 38 seconds — ABNORMAL HIGH (ref 24–37)

## 2014-06-01 LAB — SURGICAL PCR SCREEN
MRSA, PCR: POSITIVE — AB
Staphylococcus aureus: POSITIVE — AB

## 2014-06-01 LAB — HEPATIC FUNCTION PANEL
ALT: 62 U/L — ABNORMAL HIGH (ref 0–35)
AST: 84 U/L — ABNORMAL HIGH (ref 0–37)
Albumin: 3.1 g/dL — ABNORMAL LOW (ref 3.5–5.2)
Alkaline Phosphatase: 78 U/L (ref 39–117)
Bilirubin, Direct: 0.1 mg/dL (ref 0.0–0.3)
Indirect Bilirubin: 0.1 mg/dL — ABNORMAL LOW (ref 0.3–0.9)
Total Bilirubin: 0.2 mg/dL — ABNORMAL LOW (ref 0.3–1.2)
Total Protein: 6.2 g/dL (ref 6.0–8.3)

## 2014-06-01 LAB — SAMPLE TO BLOOD BANK

## 2014-06-01 SURGERY — INSERTION OF MESH
Anesthesia: General | Site: Abdomen

## 2014-06-01 MED ORDER — BUPIVACAINE-EPINEPHRINE (PF) 0.25% -1:200000 IJ SOLN
INTRAMUSCULAR | Status: AC
  Filled 2014-06-01: qty 30

## 2014-06-01 MED ORDER — ROCURONIUM BROMIDE 100 MG/10ML IV SOLN
INTRAVENOUS | Status: DC | PRN
  Administered 2014-06-01: 50 mg via INTRAVENOUS

## 2014-06-01 MED ORDER — ONDANSETRON HCL 4 MG/2ML IJ SOLN
4.0000 mg | Freq: Four times a day (QID) | INTRAMUSCULAR | Status: DC | PRN
  Administered 2014-06-04 (×2): 4 mg via INTRAVENOUS

## 2014-06-01 MED ORDER — GABAPENTIN 400 MG PO CAPS
800.0000 mg | ORAL_CAPSULE | Freq: Three times a day (TID) | ORAL | Status: DC
  Administered 2014-06-01 – 2014-06-08 (×21): 800 mg via ORAL
  Filled 2014-06-01 (×26): qty 2

## 2014-06-01 MED ORDER — DIPHENHYDRAMINE HCL 50 MG/ML IJ SOLN: 12.5000 mg | Freq: Four times a day (QID) | INTRAMUSCULAR | Status: DC | PRN

## 2014-06-01 MED ORDER — ARTIFICIAL TEARS OP OINT
TOPICAL_OINTMENT | OPHTHALMIC | Status: DC | PRN
  Administered 2014-06-01: 1 via OPHTHALMIC

## 2014-06-01 MED ORDER — MUPIROCIN 2 % EX OINT
1.0000 "application " | TOPICAL_OINTMENT | Freq: Once | CUTANEOUS | Status: AC
  Administered 2014-06-01: 1 via TOPICAL

## 2014-06-01 MED ORDER — INSULIN ASPART 100 UNIT/ML ~~LOC~~ SOLN
0.0000 [IU] | SUBCUTANEOUS | Status: DC
  Administered 2014-06-01 (×2): 4 [IU] via SUBCUTANEOUS
  Administered 2014-06-02: 3 [IU] via SUBCUTANEOUS
  Administered 2014-06-02 (×2): 4 [IU] via SUBCUTANEOUS
  Administered 2014-06-02: 3 [IU] via SUBCUTANEOUS
  Administered 2014-06-02: 4 [IU] via SUBCUTANEOUS
  Administered 2014-06-02 – 2014-06-03 (×3): 3 [IU] via SUBCUTANEOUS
  Administered 2014-06-03: 4 [IU] via SUBCUTANEOUS
  Administered 2014-06-03 – 2014-06-07 (×9): 3 [IU] via SUBCUTANEOUS
  Administered 2014-06-08: 4 [IU] via SUBCUTANEOUS

## 2014-06-01 MED ORDER — TRAZODONE HCL 100 MG PO TABS
100.0000 mg | ORAL_TABLET | Freq: Every day | ORAL | Status: DC
  Administered 2014-06-01 – 2014-06-07 (×7): 100 mg via ORAL
  Filled 2014-06-01 (×10): qty 1

## 2014-06-01 MED ORDER — SODIUM CHLORIDE 0.9 % IJ SOLN: 9.0000 mL | INTRAMUSCULAR | Status: DC | PRN

## 2014-06-01 MED ORDER — ENOXAPARIN SODIUM 30 MG/0.3ML ~~LOC~~ SOLN
30.0000 mg | SUBCUTANEOUS | Status: DC
  Administered 2014-06-02 – 2014-06-08 (×7): 30 mg via SUBCUTANEOUS
  Filled 2014-06-01 (×11): qty 0.3

## 2014-06-01 MED ORDER — NALOXONE HCL 0.4 MG/ML IJ SOLN
INTRAMUSCULAR | Status: AC
  Filled 2014-06-01: qty 1

## 2014-06-01 MED ORDER — METFORMIN HCL 500 MG PO TABS
500.0000 mg | ORAL_TABLET | Freq: Every day | ORAL | Status: DC
  Administered 2014-06-02 – 2014-06-08 (×7): 500 mg via ORAL
  Filled 2014-06-01 (×10): qty 1

## 2014-06-01 MED ORDER — LACTATED RINGERS IV SOLN
INTRAVENOUS | Status: DC
  Administered 2014-06-01: 08:00:00 via INTRAVENOUS

## 2014-06-01 MED ORDER — ONDANSETRON HCL 4 MG PO TABS: 4.0000 mg | ORAL_TABLET | Freq: Four times a day (QID) | ORAL | Status: DC | PRN

## 2014-06-01 MED ORDER — ONDANSETRON HCL 4 MG/2ML IJ SOLN
INTRAMUSCULAR | Status: DC | PRN
  Administered 2014-06-01: 4 mg via INTRAVENOUS

## 2014-06-01 MED ORDER — FLUOXETINE HCL 20 MG PO CAPS
40.0000 mg | ORAL_CAPSULE | Freq: Every day | ORAL | Status: DC
  Administered 2014-06-01 – 2014-06-08 (×8): 40 mg via ORAL
  Filled 2014-06-01 (×9): qty 2

## 2014-06-01 MED ORDER — HYDROCHLOROTHIAZIDE 25 MG PO TABS
25.0000 mg | ORAL_TABLET | Freq: Every morning | ORAL | Status: DC
  Administered 2014-06-01 – 2014-06-08 (×8): 25 mg via ORAL
  Filled 2014-06-01 (×9): qty 1

## 2014-06-01 MED ORDER — ATENOLOL 50 MG PO TABS
50.0000 mg | ORAL_TABLET | Freq: Every morning | ORAL | Status: DC
  Administered 2014-06-03 – 2014-06-08 (×6): 50 mg via ORAL
  Filled 2014-06-01 (×9): qty 1

## 2014-06-01 MED ORDER — LISINOPRIL 40 MG PO TABS
40.0000 mg | ORAL_TABLET | Freq: Every morning | ORAL | Status: DC
  Administered 2014-06-04 – 2014-06-08 (×5): 40 mg via ORAL
  Filled 2014-06-01 (×9): qty 1

## 2014-06-01 MED ORDER — FENTANYL CITRATE 0.05 MG/ML IJ SOLN
INTRAMUSCULAR | Status: AC
  Filled 2014-06-01: qty 5

## 2014-06-01 MED ORDER — HYDROMORPHONE HCL 1 MG/ML IJ SOLN
0.2500 mg | INTRAMUSCULAR | Status: DC | PRN
  Administered 2014-06-01: 0.25 mg via INTRAVENOUS

## 2014-06-01 MED ORDER — DIPHENHYDRAMINE HCL 12.5 MG/5ML PO ELIX: 12.5000 mg | ORAL_SOLUTION | Freq: Four times a day (QID) | ORAL | Status: DC | PRN

## 2014-06-01 MED ORDER — NEOSTIGMINE METHYLSULFATE 10 MG/10ML IV SOLN
INTRAVENOUS | Status: DC | PRN
  Administered 2014-06-01: 4 mg via INTRAVENOUS

## 2014-06-01 MED ORDER — LIDOCAINE HCL (CARDIAC) 20 MG/ML IV SOLN
INTRAVENOUS | Status: DC | PRN
  Administered 2014-06-01: 80 mg via INTRAVENOUS

## 2014-06-01 MED ORDER — FLUOXETINE HCL 40 MG PO CAPS: 40.0000 mg | ORAL_CAPSULE | Freq: Every day | ORAL | Status: DC

## 2014-06-01 MED ORDER — PROMETHAZINE HCL 25 MG/ML IJ SOLN: 6.2500 mg | INTRAMUSCULAR | Status: DC | PRN

## 2014-06-01 MED ORDER — BUPIVACAINE-EPINEPHRINE 0.25% -1:200000 IJ SOLN: INTRAMUSCULAR | Status: DC | PRN

## 2014-06-01 MED ORDER — LACTATED RINGERS IV SOLN
INTRAVENOUS | Status: DC | PRN
  Administered 2014-06-01 (×2): via INTRAVENOUS

## 2014-06-01 MED ORDER — FLUOROMETHOLONE 0.1 % OP SUSP
1.0000 [drp] | Freq: Two times a day (BID) | OPHTHALMIC | Status: DC
  Administered 2014-06-01 – 2014-06-08 (×14): 1 [drp] via OPHTHALMIC
  Filled 2014-06-01: qty 5

## 2014-06-01 MED ORDER — KETOROLAC TROMETHAMINE 30 MG/ML IJ SOLN
30.0000 mg | Freq: Once | INTRAMUSCULAR | Status: AC | PRN
  Administered 2014-06-01: 30 mg via INTRAVENOUS

## 2014-06-01 MED ORDER — MUPIROCIN 2 % EX OINT
TOPICAL_OINTMENT | CUTANEOUS | Status: AC
  Filled 2014-06-01: qty 22

## 2014-06-01 MED ORDER — SUCCINYLCHOLINE CHLORIDE 20 MG/ML IJ SOLN
INTRAMUSCULAR | Status: DC | PRN
  Administered 2014-06-01: 100 mg via INTRAVENOUS

## 2014-06-01 MED ORDER — ONDANSETRON HCL 4 MG/2ML IJ SOLN
4.0000 mg | Freq: Four times a day (QID) | INTRAMUSCULAR | Status: DC | PRN
Start: 2014-06-01 — End: 2014-06-04
  Filled 2014-06-01: qty 2

## 2014-06-01 MED ORDER — 0.9 % SODIUM CHLORIDE (POUR BTL) OPTIME
TOPICAL | Status: DC | PRN
  Administered 2014-06-01: 1000 mL

## 2014-06-01 MED ORDER — PROPOFOL 10 MG/ML IV BOLUS
INTRAVENOUS | Status: DC | PRN
  Administered 2014-06-01: 90 mg via INTRAVENOUS

## 2014-06-01 MED ORDER — POTASSIUM CHLORIDE IN NACL 20-0.9 MEQ/L-% IV SOLN
INTRAVENOUS | Status: DC
  Administered 2014-06-01 – 2014-06-03 (×5): via INTRAVENOUS
  Filled 2014-06-01 (×8): qty 1000

## 2014-06-01 MED ORDER — FENTANYL CITRATE 0.05 MG/ML IJ SOLN
INTRAMUSCULAR | Status: DC | PRN
  Administered 2014-06-01 (×5): 50 ug via INTRAVENOUS
  Administered 2014-06-01: 100 ug via INTRAVENOUS
  Administered 2014-06-01: 50 ug via INTRAVENOUS

## 2014-06-01 MED ORDER — KETOROLAC TROMETHAMINE 30 MG/ML IJ SOLN
INTRAMUSCULAR | Status: AC
  Filled 2014-06-01: qty 1

## 2014-06-01 MED ORDER — GLYCOPYRROLATE 0.2 MG/ML IJ SOLN
INTRAMUSCULAR | Status: DC | PRN
  Administered 2014-06-01: 0.6 mg via INTRAVENOUS

## 2014-06-01 MED ORDER — HYDROMORPHONE HCL 1 MG/ML IJ SOLN
INTRAMUSCULAR | Status: AC
  Filled 2014-06-01: qty 1

## 2014-06-01 MED ORDER — MIDAZOLAM HCL 2 MG/2ML IJ SOLN
INTRAMUSCULAR | Status: AC
  Filled 2014-06-01: qty 2

## 2014-06-01 MED ORDER — FENTANYL 10 MCG/ML IV SOLN
INTRAVENOUS | Status: DC
  Administered 2014-06-01: 110 ug via INTRAVENOUS
  Administered 2014-06-01: 95 ug via INTRAVENOUS
  Administered 2014-06-01 – 2014-06-02 (×2): via INTRAVENOUS
  Administered 2014-06-02 (×2): 10 ug via INTRAVENOUS
  Administered 2014-06-02: 30 ug via INTRAVENOUS
  Administered 2014-06-02: 60 ug via INTRAVENOUS
  Administered 2014-06-02: 10 ug via INTRAVENOUS
  Administered 2014-06-02: 40 ug via INTRAVENOUS
  Administered 2014-06-03: 30 ug via INTRAVENOUS
  Administered 2014-06-03: 10 ug via INTRAVENOUS
  Administered 2014-06-03: 50 ug via INTRAVENOUS
  Administered 2014-06-04: 10 ug via INTRAVENOUS
  Filled 2014-06-01 (×2): qty 50

## 2014-06-01 MED ORDER — PANTOPRAZOLE SODIUM 40 MG PO TBEC
40.0000 mg | DELAYED_RELEASE_TABLET | Freq: Every day | ORAL | Status: DC
  Administered 2014-06-01 – 2014-06-08 (×8): 40 mg via ORAL
  Filled 2014-06-01 (×8): qty 1

## 2014-06-01 MED ORDER — PREDNISOLONE ACETATE 1 % OP SUSP
1.0000 [drp] | Freq: Three times a day (TID) | OPHTHALMIC | Status: DC
Start: 2014-06-01 — End: 2014-06-08
  Administered 2014-06-01 – 2014-06-08 (×21): 1 [drp] via OPHTHALMIC
  Filled 2014-06-01: qty 5
  Filled 2014-06-01: qty 1

## 2014-06-01 MED ORDER — NALOXONE HCL 0.4 MG/ML IJ SOLN: 0.4000 mg | INTRAMUSCULAR | Status: DC | PRN

## 2014-06-01 SURGICAL SUPPLY — 71 items
APL SKNCLS STERI-STRIP NONHPOA (GAUZE/BANDAGES/DRESSINGS)
APPLIER CLIP 5 13 M/L LIGAMAX5 (MISCELLANEOUS)
APPLIER CLIP ROT 10 11.4 M/L (STAPLE)
APR CLP MED LRG 11.4X10 (STAPLE)
APR CLP MED LRG 5 ANG JAW (MISCELLANEOUS)
BANDAGE ADH SHEER 1  50/CT (GAUZE/BANDAGES/DRESSINGS) IMPLANT
BENZOIN TINCTURE PRP APPL 2/3 (GAUZE/BANDAGES/DRESSINGS) IMPLANT
BLADE SURG 10 STRL SS (BLADE) ×3 IMPLANT
BLADE SURG ROTATE 9660 (MISCELLANEOUS) ×3 IMPLANT
CANISTER SUCTION 2500CC (MISCELLANEOUS) ×3 IMPLANT
CHLORAPREP W/TINT 26ML (MISCELLANEOUS) ×3 IMPLANT
CLIP APPLIE 5 13 M/L LIGAMAX5 (MISCELLANEOUS) IMPLANT
CLIP APPLIE ROT 10 11.4 M/L (STAPLE) IMPLANT
COVER SURGICAL LIGHT HANDLE (MISCELLANEOUS) ×3 IMPLANT
DECANTER SPIKE VIAL GLASS SM (MISCELLANEOUS) IMPLANT
DEVICE SECURE STRAP 25 ABSORB (INSTRUMENTS) IMPLANT
DEVICE TROCAR PUNCTURE CLOSURE (ENDOMECHANICALS) IMPLANT
DRAIN CHANNEL 19F RND (DRAIN) ×6 IMPLANT
DRAPE LAPAROSCOPIC ABDOMINAL (DRAPES) ×3 IMPLANT
DRSG OPSITE POSTOP 4X10 (GAUZE/BANDAGES/DRESSINGS) ×3 IMPLANT
DRSG OPSITE POSTOP 4X6 (GAUZE/BANDAGES/DRESSINGS) ×3 IMPLANT
ELECT CAUTERY BLADE 6.4 (BLADE) ×3 IMPLANT
ELECT REM PT RETURN 9FT ADLT (ELECTROSURGICAL) ×3
ELECTRODE REM PT RTRN 9FT ADLT (ELECTROSURGICAL) ×2 IMPLANT
EVACUATOR SILICONE 100CC (DRAIN) ×6 IMPLANT
GAUZE SPONGE 2X2 8PLY STRL LF (GAUZE/BANDAGES/DRESSINGS) ×2 IMPLANT
GLOVE BIO SURGEON STRL SZ7.5 (GLOVE) ×3 IMPLANT
GLOVE BIO SURGEON STRL SZ8 (GLOVE) ×3 IMPLANT
GLOVE BIOGEL PI IND STRL 7.5 (GLOVE) ×2 IMPLANT
GLOVE BIOGEL PI IND STRL 8 (GLOVE) ×8 IMPLANT
GLOVE BIOGEL PI INDICATOR 7.5 (GLOVE) ×1
GLOVE BIOGEL PI INDICATOR 8 (GLOVE) ×4
GLOVE SURG ORTHO 8.0 STRL STRW (GLOVE) ×3 IMPLANT
GLOVE SURG SIGNA 7.5 PF LTX (GLOVE) ×3 IMPLANT
GLOVE SURG SS PI 8.0 STRL IVOR (GLOVE) ×3 IMPLANT
GOWN STRL REUS W/ TWL LRG LVL3 (GOWN DISPOSABLE) ×4 IMPLANT
GOWN STRL REUS W/ TWL XL LVL3 (GOWN DISPOSABLE) ×8 IMPLANT
GOWN STRL REUS W/TWL LRG LVL3 (GOWN DISPOSABLE) ×6
GOWN STRL REUS W/TWL XL LVL3 (GOWN DISPOSABLE) ×12
KIT BASIN OR (CUSTOM PROCEDURE TRAY) ×3 IMPLANT
KIT ROOM TURNOVER OR (KITS) ×3 IMPLANT
MARKER SKIN DUAL TIP RULER LAB (MISCELLANEOUS) ×3 IMPLANT
MESH SOFT 12X12IN BARD (Mesh General) ×3 IMPLANT
NEEDLE SPNL 22GX3.5 QUINCKE BK (NEEDLE) IMPLANT
NS IRRIG 1000ML POUR BTL (IV SOLUTION) ×3 IMPLANT
PAD ARMBOARD 7.5X6 YLW CONV (MISCELLANEOUS) ×6 IMPLANT
PENCIL BUTTON HOLSTER BLD 10FT (ELECTRODE) ×3 IMPLANT
SCALPEL HARMONIC ACE (MISCELLANEOUS) IMPLANT
SCISSORS LAP 5X35 DISP (ENDOMECHANICALS) ×3 IMPLANT
SET IRRIG TUBING LAPAROSCOPIC (IRRIGATION / IRRIGATOR) IMPLANT
SLEEVE ENDOPATH XCEL 5M (ENDOMECHANICALS) ×3 IMPLANT
SPONGE GAUZE 2X2 STER 10/PKG (GAUZE/BANDAGES/DRESSINGS) ×1
SPONGE LAP 18X18 X RAY DECT (DISPOSABLE) ×6 IMPLANT
STAPLER VISISTAT 35W (STAPLE) ×6 IMPLANT
SUT ETHILON 2 0 FS 18 (SUTURE) ×6 IMPLANT
SUT MON AB 4-0 PC3 18 (SUTURE) IMPLANT
SUT NOVA NAB GS-21 0 18 T12 DT (SUTURE) ×9 IMPLANT
SUT PDS AB 1 TP1 54 (SUTURE) ×6 IMPLANT
SUT SILK 2 0 TIES 10X30 (SUTURE) ×3 IMPLANT
SUT VIC AB 2-0 CT1 27 (SUTURE) ×3
SUT VIC AB 2-0 CT1 TAPERPNT 27 (SUTURE) ×2 IMPLANT
TAPE CLOTH SURG 4X10 WHT LF (GAUZE/BANDAGES/DRESSINGS) ×3 IMPLANT
TOWEL OR 17X24 6PK STRL BLUE (TOWEL DISPOSABLE) ×3 IMPLANT
TOWEL OR 17X26 10 PK STRL BLUE (TOWEL DISPOSABLE) ×3 IMPLANT
TRAY FOLEY CATH 16FR SILVER (SET/KITS/TRAYS/PACK) ×3 IMPLANT
TRAY LAPAROSCOPIC (CUSTOM PROCEDURE TRAY) ×3 IMPLANT
TROCAR XCEL NON-BLD 11X100MML (ENDOMECHANICALS) IMPLANT
TROCAR XCEL NON-BLD 5MMX100MML (ENDOMECHANICALS) ×3 IMPLANT
TUBE CONNECTING 12X1/4 (SUCTIONS) ×3 IMPLANT
TUBING INSUFFLATION (TUBING) IMPLANT
YANKAUER SUCT BULB TIP NO VENT (SUCTIONS) ×3 IMPLANT

## 2014-06-01 NOTE — Anesthesia Preprocedure Evaluation (Signed)
Anesthesia Evaluation  Patient identified by MRN, date of birth, ID band Patient awake    Reviewed: Allergy & Precautions, NPO status , Patient's Chart, lab work & pertinent test results  Airway Mallampati: II  TM Distance: >3 FB Neck ROM: Full    Dental no notable dental hx.    Pulmonary Current Smoker,  breath sounds clear to auscultation  Pulmonary exam normal       Cardiovascular hypertension, Pt. on medications and Pt. on home beta blockers Rhythm:Regular Rate:Normal     Neuro/Psych CVA, No Residual Symptoms    GI/Hepatic negative GI ROS, Neg liver ROS,   Endo/Other  diabetes  Renal/GU negative Renal ROS  negative genitourinary   Musculoskeletal negative musculoskeletal ROS (+)   Abdominal   Peds negative pediatric ROS (+)  Hematology  (+) anemia ,   Anesthesia Other Findings   Reproductive/Obstetrics negative OB ROS                             Anesthesia Physical Anesthesia Plan  ASA: III  Anesthesia Plan: General   Post-op Pain Management:    Induction: Intravenous  Airway Management Planned: Oral ETT  Additional Equipment:   Intra-op Plan:   Post-operative Plan: Extubation in OR  Informed Consent: I have reviewed the patients History and Physical, chart, labs and discussed the procedure including the risks, benefits and alternatives for the proposed anesthesia with the patient or authorized representative who has indicated his/her understanding and acceptance.   Dental advisory given  Plan Discussed with: CRNA and Surgeon  Anesthesia Plan Comments:         Anesthesia Quick Evaluation

## 2014-06-01 NOTE — Op Note (Signed)
[name], [name] NO.:  [id number]  MEDICAL RECORD NO.:  [id number]  LOCATION:  6N26C                        FACILITY:  [facility]  PHYSICIAN:  Coralie Keens, M.D. DATE OF BIRTH:  12/10/1950  DATE OF PROCEDURE:  06/01/2014 DATE OF DISCHARGE:                              OPERATIVE REPORT   PREOPERATIVE DIAGNOSIS:  Incisional hernia.  POSTOPERATIVE DIAGNOSIS:  Incisional hernia.  PROCEDURE:  Laparoscopic converted to open incisional hernia repair with mesh.  SURGEON:  Coralie Keens, M.D.  ASSISTANT:  Earnstine Regal, MD.  ANESTHESIA:  General endotracheal anesthesia.  ESTIMATED BLOOD LOSS:  Minimal.  INDICATIONS:  This is a 64 year old female with multiple comorbidities who has had multiple surgical procedures.  She now presents with a large ventral incisional hernia.  Decision was made to proceed with repair with mesh.  FINDINGS:  The patient was found to have a large fascial defect going all the way down to the pubis.  This necessitated conversion to an open procedure and repair with mesh.  It was repaired with a 30 cm x 30 cm soft Bard Prolene mesh as an onlay.  I then noted she had micronodular cirrhosis of her liver.  PROCEDURE IN DETAIL:  The patient was brought to the operating room, identified as [name].  She was placed supine on the operating room table and general anesthesia was induced.  Her abdomen was then prepped and draped in usual sterile fashion.  I made a small incision in the patient's left upper quadrant using 5-mm trocar and Optiview camera to slowly traverse all levels of the abdominal wall.  We gain entrance to the peritoneal cavity under direct vision.  Insufflation of the abdomen was then begun.  The patient was found to have a large fascial defect with omentum completely encased in the defect.  There was also colon involved in the upper portion of the defect.  The actual fascial defect was quite wide going all the  way down to the pubic bone.  I inserted another 5-mm port in the patient's right upper quadrant and again evaluated the area.  At this point, it was determined this hernia would best be fixed with an open technique.  At this point, I then terminated the laparoscopic part of the incision.  I made a large elliptical incision on the abdomen incorporating the previous open scar in the umbilicus.  I took this down to the subcutaneous tissue with electrocautery.  I then excised the redundant skin of the old scar completely.  I then opened the fascia the entire length of the incision through the hernia sac.  I then performed lysis of adhesions dissecting the omentum off of the fascia and peritoneum circumferentially with a cautery.  I freed up the colon that was caught in the superior portion of the hernia sac easily with the cautery as well.  At this point, it was a very large fascial defect.  I had to undermine the skin circumferentially around the fascia out laterally for a wide area on both sides and all the way down to the pubis and up to the sternum.  I then had to create relaxing incisions in the fascia on  both sides in order to bring the fascia together at the midline.  At this point, we evaluated the rest of the bowel and the abdomen was found to be normal in appearance without evidence of injury.  The liver had a micronodular cirrhotic appearance.  Several omental bleeders had to be closed with 2- 0 silk ties.  At this point, hemostasis appeared to be achieved.  I then closed the patient's midline fascia with a running #1 PDS suture.  I then brought a 30 cm x 30 cm soft Prolene mesh from Bard onto the field. I cut it in the appropriate size and placed as an onlay over the fascia. I then sewed it in place circumferentially with interrupted 0 Novafil sutures.  Very wide coverage of the fascia appeared to be achieved circumferentially.  I then irrigated the wound thoroughly with saline. I  made 2 separate skin incisions and placed two 19-French Blake drains into the subcutaneous space above the mesh.  I then sutured in place with nylon sutures.  I then closed subcutaneous tissue with a running 2- 0 Vicryl suture and closed the skin with skin staples.  The trocar sites were closed with staples as well.  The patient tolerated the procedure well.  All the counts were correct at the end of the procedure.  The patient was then extubated in the operating room and taken in stable condition to recovery room.     Coralie Keens, M.D.     DB/MEDQ  D:  06/01/2014  T:  06/01/2014  Job:  [id number]

## 2014-06-01 NOTE — Transfer of Care (Signed)
Immediate Anesthesia Transfer of Care Note  Patient: [name]  Procedure(s) Performed: Procedure(s): INSERTION OF MESH (N/A) ATTEMPTED LAPAROSCOPIC AND OPEN INCISIONAL HERNIA REPAIR WITH MESH (N/A)  Patient Location: PACU  Anesthesia Type:General  Level of Consciousness: awake, patient cooperative and lethargic  Airway & Oxygen Therapy: Patient Spontanous Breathing and Patient connected to face mask oxygen with NPA in place in R nare. (Initial desaturation upon arrival to PACU, improved with NPA and ventilation assistance with ambu bag.  Dr Marcie Bal present at bedside for event)  Post-op Assessment: Report given to PACU RN and Post -op Vital signs reviewed and stable  Post vital signs: Reviewed and stable  Complications: No apparent anesthesia complications

## 2014-06-01 NOTE — Anesthesia Procedure Notes (Signed)
Procedure Name: Intubation Date/Time: 06/01/2014 8:59 AM Performed by: Ned Grace Pre-anesthesia Checklist: Patient identified, Timeout performed, Emergency Drugs available, Suction available and Patient being monitored Patient Re-evaluated:Patient Re-evaluated prior to inductionOxygen Delivery Method: Circle system utilized Preoxygenation: Pre-oxygenation with 100% oxygen Intubation Type: IV induction Ventilation: Oral airway inserted - appropriate to patient size and Mask ventilation with difficulty Laryngoscope Size: Mac and 3 Grade View: Grade I Tube type: Oral Tube size: 7.0 mm Number of attempts: 1 Airway Equipment and Method: Stylet Placement Confirmation: ETT inserted through vocal cords under direct vision,  breath sounds checked- equal and bilateral and positive ETCO2 Secured at: 21 cm Tube secured with: Tape Dental Injury: Teeth and Oropharynx as per pre-operative assessment

## 2014-06-01 NOTE — OR Nursing (Signed)
Pt. Arrived to pacu from OR on bed.  Pt arousable but not spontaneously breathing or able to control her airway.  Oxygen Saturations were in the 40s, HR in the 80s.  CRNA provided airway stabilization with chin lift, oral and nasal airways and bag mask ventilations.  Dr. Marcie Bal quickly present to bedside.  After a few minutes of airway/breathing support from CRNA, [name] began to spontaneously breathe.  We transitioned her over to a NRB and monitored her breathing closely until we were able to remove her oral airway.  She is now responsive, following commands, spontaneously breathing with NRB and sats of 97%.

## 2014-06-01 NOTE — Op Note (Signed)
INSERTION OF MESH, ATTEMPTED LAPAROSCOPIC AND OPEN INCISIONAL HERNIA REPAIR WITH MESH  Procedure Note  [name] 06/01/2014   Pre-op Diagnosis: Incisional Hernia     Post-op Diagnosis: same  Procedure(s): INSERTION OF MESH ATTEMPTED LAPAROSCOPIC AND OPEN INCISIONAL HERNIA REPAIR WITH MESH  Surgeon(s): Coralie Keens, MD Armandina Gemma, MD  Anesthesia: General  Staff:  Circulator: Beryle Lathe, RN; Sharlot Gowda, RN Scrub Person: Leslie Andrea, CST; Beryle Lathe, RN; Warren Lacy, RN Circulator Assistant: Beryle Lathe, RN; Quincy Carnes, RN  Estimated Blood Loss: Minimal                         [name]   Date: 06/01/2014  Time: 10:46 AM

## 2014-06-01 NOTE — Anesthesia Postprocedure Evaluation (Signed)
Anesthesia Post Note  Patient: [name]  Procedure(s) Performed: Procedure(s) (LRB): INSERTION OF MESH (N/A) ATTEMPTED LAPAROSCOPIC AND OPEN INCISIONAL HERNIA REPAIR WITH MESH (N/A)  Anesthesia type: General  Patient location: PACU  Post pain: Pain level controlled and Adequate analgesia  Post assessment: Post-op Vital signs reviewed, Patient's Cardiovascular Status Stable, Respiratory Function Stable, Patent Airway and Pain level controlled  Last Vitals:  Filed Vitals:   06/01/14 1215  BP:   Pulse: 73  Temp:   Resp: 10    Post vital signs: Reviewed and stable  Level of consciousness: awake, alert  and oriented  Complications: No apparent anesthesia complications

## 2014-06-02 LAB — BASIC METABOLIC PANEL
Anion gap: 11 (ref 5–15)
BUN: 10 mg/dL (ref 6–23)
CHLORIDE: 109 meq/L (ref 96–112)
CO2: 24 mmol/L (ref 19–32)
CREATININE: 0.82 mg/dL (ref 0.50–1.10)
Calcium: 8.4 mg/dL (ref 8.4–10.5)
GFR calc Af Amer: 88 mL/min — ABNORMAL LOW (ref >90)
GFR, EST NON AFRICAN AMERICAN: 76 mL/min — AB (ref >90)
Glucose, Bld: 100 mg/dL — ABNORMAL HIGH (ref 70–99)
POTASSIUM: 3.8 mmol/L (ref 3.5–5.1)
SODIUM: 144 mmol/L (ref 135–145)

## 2014-06-02 LAB — GLUCOSE, CAPILLARY
GLUCOSE-CAPILLARY: 156 mg/dL — AB (ref 70–99)
GLUCOSE-CAPILLARY: 144 mg/dL — AB (ref 70–99)
Glucose-Capillary: 148 mg/dL — ABNORMAL HIGH (ref 70–99)
Glucose-Capillary: 149 mg/dL — ABNORMAL HIGH (ref 70–99)
Glucose-Capillary: 166 mg/dL — ABNORMAL HIGH (ref 70–99)
Glucose-Capillary: 188 mg/dL — ABNORMAL HIGH (ref 70–99)

## 2014-06-02 LAB — CBC
HCT: 35.1 % — ABNORMAL LOW (ref 36.0–46.0)
HEMOGLOBIN: 11.1 g/dL — AB (ref 12.0–15.0)
MCH: 29.2 pg (ref 26.0–34.0)
MCHC: 31.6 g/dL (ref 30.0–36.0)
MCV: 92.4 fL (ref 78.0–100.0)
Platelets: 250 10*3/uL (ref 150–400)
RBC: 3.8 MIL/uL — AB (ref 3.87–5.11)
RDW: 12.8 % (ref 11.5–15.5)
WBC: 6.2 10*3/uL (ref 4.0–10.5)

## 2014-06-02 LAB — PREPARE RBC (CROSSMATCH)

## 2014-06-02 MED ORDER — SODIUM CHLORIDE 0.9 % IV SOLN: Freq: Once | INTRAVENOUS | Status: DC

## 2014-06-02 MED ORDER — SODIUM CHLORIDE 0.9 % IV BOLUS (SEPSIS)
1000.0000 mL | Freq: Once | INTRAVENOUS | Status: AC
  Administered 2014-06-02: 1000 mL via INTRAVENOUS

## 2014-06-02 NOTE — Progress Notes (Signed)
PA stated internal medicine is coming to see pt.  No new orders at this time. [name]

## 2014-06-02 NOTE — Progress Notes (Addendum)
1 Day Post-Op  Subjective: Complains of incisional pain  Objective: Vital signs in last 24 hours: Temp:  [97 F (36.1 C)-98.8 F (37.1 C)] 97.4 F (36.3 C) (01/06 0542) Pulse Rate:  [68-89] 82 (01/06 0542) Resp:  [10-20] 16 (01/06 0542) BP: (82-178)/(41-78) 106/78 mmHg (01/06 0542) SpO2:  [44 %-99 %] 97 % (01/06 0542) Weight:  [188 lb (85.276 kg)] 188 lb (85.276 kg) (01/05 0656)    Intake/Output from previous day: 01/05 0701 - 01/06 0700 In: 1600 [I.V.:1500] Out: 945 [Urine:500; Drains:370; Blood:75] Intake/Output this shift: Total I/O In: -  Out: 235 [Urine:50; Drains:185]  Lungs clear Abdomen soft Drains serosang  Lab Results:  No results for input(s): WBC, HGB, HCT, PLT in the last 72 hours. BMET  Recent Labs  06/02/14 0403  NA 144  K 3.8  CL 109  CO2 24  GLUCOSE 100*  BUN 10  CREATININE 0.82  CALCIUM 8.4   PT/INR  Recent Labs  06/01/14 0747  LABPROT 14.4  INR 1.10   ABG No results for input(s): PHART, HCO3 in the last 72 hours.  Invalid input(s): PCO2, PO2  Studies/Results: No results found.  Anti-infectives: Anti-infectives    Start     Dose/Rate Route Frequency Ordered Stop   06/01/14 0600  vancomycin (VANCOCIN) IVPB 1000 mg/200 mL premix     1,000 mg200 mL/hr over 60 Minutes Intravenous On call to O.R. 05/31/14 1418 06/01/14 0935      Assessment/Plan: s/p Procedure(s): INSERTION OF MESH (N/A) ATTEMPTED LAPAROSCOPIC AND OPEN INCISIONAL HERNIA REPAIR WITH MESH (N/A)  Keep foley until tomorrow  Anemia of chronic disease:  Her Hematologist has requested I go ahead and transfuse her 2 units of PRBC's while she is here in the hospital.  She requires frequent transfusions.  (this is NOT for post op blood loss)  Abdominal binder  PT consult  Will leave the foley until POD#2 because of her difficulty ambulating  LOS: 1 day    Carsen Machi A 06/02/2014

## 2014-06-02 NOTE — Progress Notes (Signed)
Abdominal binder placed. [name]

## 2014-06-02 NOTE — Progress Notes (Signed)
Orthopedic Tech Progress Note Patient Details:  [name] 05-26-1951 [id number]  Ortho Devices Type of Ortho Device: Abdominal binder Ortho Device/Splint Location: abdomed Ortho Device/Splint Interventions: Loanne Drilling, Laurence Crofford 06/02/2014, 10:15 AM

## 2014-06-02 NOTE — Progress Notes (Signed)
Blood bank called and stated that they had contacted pt's hematologist and gave pt's current hemoglobin level and hematologist agreed to hold off on transfusing blood.  Will continue to monitor and carry out orders. [name]

## 2014-06-02 NOTE — Progress Notes (Signed)
Placed call to blood bank to get an update on the transfusions and [name] from blood bank stated her medical director is trying to get in touch with Dr. Ninfa Linden regarding the transfusions and to also speak with the hematologist.  [name] stated she would call me with any update.  Will continue to monitor. [name]

## 2014-06-02 NOTE — Progress Notes (Signed)
Lovena Le the patients nurse called me about the patient having some hypotension and low urine output.  Will hold anti-hypertensive's and given a bolus.  Increase IVF until urine output picks up.  May need to replace foley if not improving.  Coralie Keens, PA-C General Surgery [facility] Surgery [phone] @T @

## 2014-06-02 NOTE — Progress Notes (Signed)
Pt's BP is 75/42.  Pt asymptomatic.  PA paged.  Will continue to monitor. [name]

## 2014-06-02 NOTE — Progress Notes (Signed)
Spoke with blood bank about pt's transfusions.  Blood bank is currently trying to contact pt's hematologist regarding hemoglobin level and transfusion status.  Blood bank stated they will call me back when they have information for me regarding blood transfusion.  Will continue to monitor. [name]

## 2014-06-03 ENCOUNTER — Encounter (HOSPITAL_COMMUNITY): Payer: Self-pay | Admitting: Surgery

## 2014-06-03 LAB — CBC
HCT: 18.3 % — ABNORMAL LOW (ref 36.0–46.0)
HEMOGLOBIN: 5.9 g/dL — AB (ref 12.0–15.0)
MCH: 30.7 pg (ref 26.0–34.0)
MCHC: 32.2 g/dL (ref 30.0–36.0)
MCV: 95.3 fL (ref 78.0–100.0)
Platelets: 183 10*3/uL (ref 150–400)
RBC: 1.92 MIL/uL — ABNORMAL LOW (ref 3.87–5.11)
RDW: 14.5 % (ref 11.5–15.5)
WBC: 9.9 10*3/uL (ref 4.0–10.5)

## 2014-06-03 LAB — BASIC METABOLIC PANEL
Anion gap: 4 — ABNORMAL LOW (ref 5–15)
BUN: 22 mg/dL (ref 6–23)
CALCIUM: 7.5 mg/dL — AB (ref 8.4–10.5)
CO2: 21 mmol/L (ref 19–32)
Chloride: 107 mEq/L (ref 96–112)
Creatinine, Ser: 1.37 mg/dL — ABNORMAL HIGH (ref 0.50–1.10)
GFR calc Af Amer: 47 mL/min — ABNORMAL LOW (ref >90)
GFR, EST NON AFRICAN AMERICAN: 41 mL/min — AB (ref >90)
Glucose, Bld: 143 mg/dL — ABNORMAL HIGH (ref 70–99)
POTASSIUM: 6.5 mmol/L — AB (ref 3.5–5.1)
Sodium: 132 mmol/L — ABNORMAL LOW (ref 135–145)

## 2014-06-03 LAB — PREPARE RBC (CROSSMATCH)

## 2014-06-03 LAB — GLUCOSE, CAPILLARY
GLUCOSE-CAPILLARY: 130 mg/dL — AB (ref 70–99)
GLUCOSE-CAPILLARY: 136 mg/dL — AB (ref 70–99)
GLUCOSE-CAPILLARY: 149 mg/dL — AB (ref 70–99)
GLUCOSE-CAPILLARY: 166 mg/dL — AB (ref 70–99)
Glucose-Capillary: 126 mg/dL — ABNORMAL HIGH (ref 70–99)
Glucose-Capillary: 126 mg/dL — ABNORMAL HIGH (ref 70–99)
Glucose-Capillary: 130 mg/dL — ABNORMAL HIGH (ref 70–99)

## 2014-06-03 LAB — POTASSIUM: Potassium: 5.2 mmol/L — ABNORMAL HIGH (ref 3.5–5.1)

## 2014-06-03 MED ORDER — SODIUM CHLORIDE 0.9 % IV BOLUS (SEPSIS)
1000.0000 mL | Freq: Once | INTRAVENOUS | Status: AC
  Administered 2014-06-03: 1000 mL via INTRAVENOUS

## 2014-06-03 MED ORDER — SODIUM CHLORIDE 0.9 % IV SOLN
INTRAVENOUS | Status: DC
  Administered 2014-06-03 – 2014-06-08 (×7): via INTRAVENOUS

## 2014-06-03 MED ORDER — SODIUM CHLORIDE 0.9 % IV SOLN
Freq: Once | INTRAVENOUS | Status: AC
  Administered 2014-06-03: 12:00:00 via INTRAVENOUS

## 2014-06-03 NOTE — Clinical Documentation Improvement (Signed)
Presents with postoperative incisional hernia; repair with mesh performed.   Patient with abnormal lab value with hypotension and low urine output reported  Creatinine on admit was 0.82; has increased to 1.37 with this morning's result.  This is an increase of 0.55 in a 48 hour time period  Treated with fluid bolus  Please provide a diagnosis associated with the above data and treatment provided and document findings in next progress note and discharge summary if applicable.  Acute Renal Failure/Acute Kidney Injury Acute on Chronic Renal Failure Chronic Renal Failure Other Condition  Thank You, Zoila Shutter ,RN Clinical Documentation Specialist:  [place] [employer] [facility]

## 2014-06-03 NOTE — Progress Notes (Signed)
2 Days Post-Op  Subjective: Still with moderate pain UOP slightly improved  Objective: Vital signs in last 24 hours: Temp:  [98.9 F (37.2 C)-99.4 F (37.4 C)] 98.9 F (37.2 C) (01/07 0609) Pulse Rate:  [85-97] 96 (01/07 0609) Resp:  [18-24] 23 (01/07 0609) BP: (75-102)/(42-62) 102/62 mmHg (01/07 0609) SpO2:  [91 %-97 %] 91 % (01/07 0609) Last BM Date:  (PTA)  Intake/Output from previous day: 01/06 0701 - 01/07 0700 In: 2483.9 [P.O.:360; I.V.:2123.9] Out: 1530 [Urine:1260; Drains:270] Intake/Output this shift:    Lungs clear Abdomen soft, drains serosang  Lab Results:   Recent Labs  06/02/14 0403  WBC 6.2  HGB 11.1*  HCT 35.1*  PLT 250   BMET  Recent Labs  06/02/14 0403  NA 144  K 3.8  CL 109  CO2 24  GLUCOSE 100*  BUN 10  CREATININE 0.82  CALCIUM 8.4   PT/INR  Recent Labs  06/01/14 0747  LABPROT 14.4  INR 1.10   ABG No results for input(s): PHART, HCO3 in the last 72 hours.  Invalid input(s): PCO2, PO2  Studies/Results: No results found.  Anti-infectives: Anti-infectives    Start     Dose/Rate Route Frequency Ordered Stop   06/01/14 0600  vancomycin (VANCOCIN) IVPB 1000 mg/200 mL premix     1,000 mg200 mL/hr over 60 Minutes Intravenous On call to O.R. 05/31/14 1418 06/01/14 0935      Assessment/Plan: s/p Procedure(s): INSERTION OF MESH (N/A) ATTEMPTED LAPAROSCOPIC AND OPEN INCISIONAL HERNIA REPAIR WITH MESH (N/A)  Still with significant pain as expected given size of hernia. PT consult Keep on liquids Will leave foley secondary to close monitoring of I's and O's, patient's pain, and her limited ability to ambulate  LOS: 2 days    Shermika Balthaser A 06/03/2014

## 2014-06-03 NOTE — Progress Notes (Signed)
CRITICAL VALUE ALERT  Critical value received:  Potassium: 6.5   Date of notification:  06/03/14  Time of notification:  0848  Critical value read back:Yes.    Nurse who received alert:  Kathrine Cords, RN  MD notified (1st page):  Dr. Ninfa Linden  Time of first page:  [phone]  MD notified (2nd page):  Time of second page:  Responding MD:  Dr. Ninfa Linden  Time MD responded:  [phone]

## 2014-06-03 NOTE — Progress Notes (Signed)
PT Cancellation Note  Patient Details Name: [name] MRN: [id number] DOB: 1951-03-24   Cancelled Treatment:    Reason Eval/Treat Not Completed: Patient declined, no reason specified Pt declined participating in therapy this PM, "i just don't feel like it." This PT woke patient from sleeping. Pt with Hbg 5.9 s/p 1 unit of blood. RN starting second unit. Will follow up next available time.   Candy Sledge A 06/03/2014, 4:42 PM  Candy Sledge, PT, DPT [phone]

## 2014-06-03 NOTE — Progress Notes (Signed)
CRITICAL VALUE ALERT  Critical value received:  Hemoglobin 5.9  Date of notification:  06/03/14  Time of notification:  1101  Critical value read back:Yes.    Nurse who received alert:  Kathrine Cords, RN  MD notified (1st page):  Dr. Ninfa Linden  Time of first page:  1110  MD notified (2nd page):  Time of second page:  Responding MD:  Dr. Ninfa Linden  Time MD responded:  [phone]

## 2014-06-04 LAB — GLUCOSE, CAPILLARY
GLUCOSE-CAPILLARY: 133 mg/dL — AB (ref 70–99)
Glucose-Capillary: 128 mg/dL — ABNORMAL HIGH (ref 70–99)
Glucose-Capillary: 140 mg/dL — ABNORMAL HIGH (ref 70–99)
Glucose-Capillary: 129 mg/dL — ABNORMAL HIGH (ref 70–99)
Glucose-Capillary: 129 mg/dL — ABNORMAL HIGH (ref 70–99)

## 2014-06-04 LAB — CBC
HCT: 26.9 % — ABNORMAL LOW (ref 36.0–46.0)
HEMOGLOBIN: 8.9 g/dL — AB (ref 12.0–15.0)
MCH: 28.9 pg (ref 26.0–34.0)
MCHC: 33.1 g/dL (ref 30.0–36.0)
MCV: 87.3 fL (ref 78.0–100.0)
PLATELETS: 200 10*3/uL (ref 150–400)
RBC: 3.08 MIL/uL — AB (ref 3.87–5.11)
RDW: 17.3 % — ABNORMAL HIGH (ref 11.5–15.5)
WBC: 11.5 10*3/uL — ABNORMAL HIGH (ref 4.0–10.5)

## 2014-06-04 LAB — TYPE AND SCREEN
ABO/RH(D): AB POS
Antibody Screen: NEGATIVE
Unit division: 0
Unit division: 0

## 2014-06-04 MED ORDER — TRAMADOL HCL 50 MG PO TABS
50.0000 mg | ORAL_TABLET | Freq: Four times a day (QID) | ORAL | Status: DC | PRN
  Administered 2014-06-04 – 2014-06-07 (×5): 100 mg via ORAL
  Filled 2014-06-04 (×4): qty 2
  Filled 2014-06-04: qty 1
  Filled 2014-06-04: qty 2

## 2014-06-04 MED ORDER — CETYLPYRIDINIUM CHLORIDE 0.05 % MT LIQD
7.0000 mL | Freq: Two times a day (BID) | OROMUCOSAL | Status: DC
  Administered 2014-06-04 – 2014-06-08 (×4): 7 mL via OROMUCOSAL

## 2014-06-04 MED ORDER — FENTANYL CITRATE 0.05 MG/ML IJ SOLN: 50.0000 ug | INTRAMUSCULAR | Status: DC | PRN

## 2014-06-04 NOTE — Progress Notes (Signed)
PT Cancellation Note  Patient Details Name: [name] MRN: [id number] DOB: [birth year]   Cancelled Treatment:    Reason Eval/Treat Not Completed: Patient declined, no reason specified.  PT went into pts room on two different occasions with max motivation and encouragement to get OOB, however pt continued to decline and state "I'll get up tomorrow."  Will check back tomorrow.    Denice Bors 06/04/2014, 11:38 AM

## 2014-06-04 NOTE — Progress Notes (Signed)
Wasted 34 cc Fentanyl IV in sink. Witnessed by Esperanza Richters, rN.

## 2014-06-04 NOTE — Care Management Note (Signed)
  Page 1 of 1   06/07/2014     10:21:51 AM CARE MANAGEMENT NOTE 06/07/2014  Patient:  ANNISTEN, MANCHESTER   Account Number:  [id number]  Date Initiated:  06/04/2014  Documentation initiated by:  Magdalen Spatz  Subjective/Objective Assessment:     Action/Plan:   Anticipated DC Date:  06/08/2014   Anticipated DC Plan:  [place]         Choice offered to / List presented to:  C-1 Patient   DME arranged  Vassie Moselle      DME agency  [place] arranged  HH-1 RN  [place].   Status of service:  Completed, signed off Medicare Important Message given?  YES (If response is "NO", the following Medicare IM given date fields will be blank) Date Medicare IM given:  06/04/2014 Medicare IM given by:  Magdalen Spatz Date Additional Medicare IM given:  06/07/2014 Additional Medicare IM given by:  Magdalen Spatz  Discharge Disposition:  [place]  Per UR Regulation:  Reviewed for med. necessity/level of care/duration of stay  If discussed at [place] of Stay Meetings, dates discussed:    Comments:  06-07-14 Patient confirmed face sheet information. Patient requesting walker , same ordered . Magdalen Spatz RN BSN [phone]

## 2014-06-05 LAB — CBC
HEMATOCRIT: 27.2 % — AB (ref 36.0–46.0)
Hemoglobin: 8.9 g/dL — ABNORMAL LOW (ref 12.0–15.0)
MCH: 28.9 pg (ref 26.0–34.0)
MCHC: 32.7 g/dL (ref 30.0–36.0)
MCV: 88.3 fL (ref 78.0–100.0)
Platelets: 218 10*3/uL (ref 150–400)
RBC: 3.08 MIL/uL — AB (ref 3.87–5.11)
RDW: 16.9 % — ABNORMAL HIGH (ref 11.5–15.5)
WBC: 10.9 10*3/uL — AB (ref 4.0–10.5)

## 2014-06-05 LAB — GLUCOSE, CAPILLARY
GLUCOSE-CAPILLARY: 114 mg/dL — AB (ref 70–99)
GLUCOSE-CAPILLARY: 99 mg/dL (ref 70–99)
Glucose-Capillary: 103 mg/dL — ABNORMAL HIGH (ref 70–99)
Glucose-Capillary: 104 mg/dL — ABNORMAL HIGH (ref 70–99)
Glucose-Capillary: 104 mg/dL — ABNORMAL HIGH (ref 70–99)
Glucose-Capillary: 106 mg/dL — ABNORMAL HIGH (ref 70–99)
Glucose-Capillary: 96 mg/dL (ref 70–99)

## 2014-06-05 LAB — BASIC METABOLIC PANEL
Anion gap: 10 (ref 5–15)
BUN: 9 mg/dL (ref 6–23)
CALCIUM: 8.2 mg/dL — AB (ref 8.4–10.5)
CO2: 21 mmol/L (ref 19–32)
Chloride: 102 mEq/L (ref 96–112)
Creatinine, Ser: 0.81 mg/dL (ref 0.50–1.10)
GFR calc Af Amer: 89 mL/min — ABNORMAL LOW (ref >90)
GFR calc non Af Amer: 77 mL/min — ABNORMAL LOW (ref >90)
GLUCOSE: 108 mg/dL — AB (ref 70–99)
Potassium: 4.4 mmol/L (ref 3.5–5.1)
Sodium: 133 mmol/L — ABNORMAL LOW (ref 135–145)

## 2014-06-05 MED ORDER — METHOCARBAMOL 750 MG PO TABS
750.0000 mg | ORAL_TABLET | Freq: Three times a day (TID) | ORAL | Status: DC | PRN
  Administered 2014-06-06: 750 mg via ORAL
  Filled 2014-06-05: qty 1

## 2014-06-05 MED ORDER — WHITE PETROLATUM GEL
Status: AC
  Administered 2014-06-05: 0.2
  Filled 2014-06-05: qty 1

## 2014-06-05 NOTE — Evaluation (Signed)
Physical Therapy Evaluation Patient Details Name: [name] MRN: [id number] DOB: [birth year] Today's Date: 06/05/2014   History of Present Illness  64 yo female with new ventral hernia repair and previous L AK amputation who has become weak and less safe with mobility, PMHx:  PVD, HTN, diverticulitis, transfusion, Hep C, anxiety  Clinical Impression  Pt has finally been assessed for PT with her family member insisting she was able to walk with L AK prosthesis and some AD, SPC or RW.  Pt usually in the house in wc and out with leg to walk to church and other activities.  Will plan on SNF as pt cannot pivot safely and will have some support to walk on LLE with 2 person assist if able.    Follow Up Recommendations SNF;Supervision/Assistance - 24 hour    Equipment Recommendations  None recommended by PT    Recommendations for Other Services       Precautions / Restrictions Precautions Precautions: Fall;Other (comment) (B JP drains in her abdomen) Precaution Comments: abd binder to protect hernia repair Required Braces or Orthoses: Other Brace/Splint (abd binder) Restrictions Weight Bearing Restrictions: No      Mobility  Bed Mobility Overal bed mobility: Needs Assistance Bed Mobility: Rolling;Supine to Sit Rolling: Min assist   Supine to sit: Mod assist     General bed mobility comments: Pt unfamiliar with precautions for her abd incision  Transfers Overall transfer level: Needs assistance Equipment used: 1 person hand held assist Transfers: Sit to/from [facility] Sit to Stand: Mod assist Stand pivot transfers: Mod assist       General transfer comment: Pt comfortable with letting PT assist her to stand and pivot on RLE to chair  Ambulation/Gait             General Gait Details: gait requires 2 people  Stairs            Wheelchair Mobility    Modified Rankin (Stroke Patients Only)       Balance Overall balance assessment: Needs  assistance Sitting-balance support: Single extremity supported;Bilateral upper extremity supported Sitting balance-Leahy Scale: Fair   Postural control: Posterior lean Standing balance support: Bilateral upper extremity supported Standing balance-Leahy Scale: Poor Standing balance comment: Pt was able to assist pivot transfer to chair by sliding R foot but did not walk due to not having a second person to assist                             Pertinent Vitals/Pain Pain Assessment: No/denies pain    Home Living Family/patient expects to be discharged to:: Private residence Living Arrangements: Alone Available Help at Discharge: Personal care attendant;Family Type of Home: House Home Access: Level entry     Home Layout: One level Home Equipment: [place] - 2 wheels;Cane - single point;Wheelchair - manual Additional Comments: wc in the house and walks on prosthesis outside of house    Prior Function Level of Independence: Independent with assistive device(s);Needs assistance (assistance to perform daily activities but mobility ind)   Gait / Transfers Assistance Needed: caregivers to assist  ADL's / Homemaking Assistance Needed:  (caregivers to assist)        Hand Dominance        Extremity/Trunk Assessment   Upper Extremity Assessment: Generalized weakness           Lower Extremity Assessment: Generalized weakness      Cervical / Trunk Assessment: Normal  Communication  Communication: No difficulties  Cognition Arousal/Alertness: Awake/alert Behavior During Therapy: Impulsive Overall Cognitive Status: Impaired/Different from baseline Area of Impairment: Safety/judgement;Problem solving;Awareness;Following commands     Memory: Decreased recall of precautions Following Commands: Follows one step commands inconsistently Safety/Judgement: Decreased awareness of safety;Decreased awareness of deficits Awareness: Anticipatory;Intellectual Problem Solving:  Slow processing;Decreased initiation;Difficulty sequencing      General Comments General comments (skin integrity, edema, etc.): Pt is confused and overly confident with her ability to manage at home.  Planning to recommend SNF for short stay and nursing in agreement.  Her level of assistance would be much higher than her caregivers have offered her previously.    Exercises        Assessment/Plan    PT Assessment Patient needs continued PT services  PT Diagnosis Generalized weakness   PT Problem List Decreased strength;Decreased range of motion;Decreased activity tolerance;Decreased balance;Decreased mobility;Decreased coordination;Decreased cognition;Decreased knowledge of use of DME;Decreased safety awareness;Decreased knowledge of precautions;Cardiopulmonary status limiting activity;Obesity;Decreased skin integrity  PT Treatment Interventions DME instruction;Gait training;Functional mobility training;Therapeutic activities;Therapeutic exercise;Balance training;Neuromuscular re-education;Cognitive remediation;Patient/family education   PT Goals (Current goals can be found in the Care Plan section) Acute Rehab PT Goals Patient Stated Goal: to go straight home PT Goal Formulation: With patient Time For Goal Achievement: 06/19/14 Potential to Achieve Goals: Good    Frequency Min 3X/week   Barriers to discharge Decreased caregiver support      Co-evaluation               End of Session Equipment Utilized During Treatment: Other (comment) (FWW first attempted) Activity Tolerance: Patient limited by lethargy;Other (comment) (Weakness) Patient left: in chair;with call bell/phone within reach;with nursing/sitter in room Nurse Communication: Mobility status         Time: 5945-8592 PT Time Calculation (min) (ACUTE ONLY): 47 min   Charges:   PT Evaluation $Initial PT Evaluation Tier I: 1 Procedure PT Treatments $Therapeutic Activity: 23-37 mins   PT G Codes:         Ramond Dial Jun 16, 2014, 3:45 PM   Mee Hives, PT MS Acute Rehab Dept. Number: [phone]

## 2014-06-05 NOTE — Progress Notes (Signed)
[facility] Progress Note  4 Days Post-Op  Subjective: Pt feeling better. Tolerating diet, c/o pain.  Urinating well, foley in place.  Having BM's and flatus.  Drains with sanguinous drainage.  Objective: Vital signs in last 24 hours: Temp:  [98.3 F (36.8 C)-99 F (37.2 C)] 99 F (37.2 C) (01/09 0606) Pulse Rate:  [71-100] 100 (01/09 0606) Resp:  [18-23] 18 (01/09 0606) BP: (136-177)/(70-120) 139/120 mmHg (01/09 0606) SpO2:  [93 %-100 %] 93 % (01/09 0606) Last BM Date: 06/04/14  Intake/Output from previous day: 01/08 0701 - 01/09 0700 In: 2100 [I.V.:2100] Out: 1397 [Urine:925; Drains:470; Stool:2] Intake/Output this shift:    PE: Gen:  Alert, NAD, pleasant Card:  RRR, no M/G/R heard Pulm:  CTA, no W/R/R, IS to 500 Abd: Soft, mild distension, tender over incisions sites, +BS, no HSM, incisions C/intact with sanguinous drainage on dressings, b/l JP drains with with sanguinous drainage 460mL/24 hr   Lab Results:   Recent Labs  06/04/14 0826 06/05/14 0347  WBC 11.5* 10.9*  HGB 8.9* 8.9*  HCT 26.9* 27.2*  PLT 200 218   BMET  Recent Labs  06/03/14 0703 06/03/14 1332 06/05/14 0347  NA 132*  --  133*  K 6.5* 5.2* 4.4  CL 107  --  102  CO2 21  --  21  GLUCOSE 143*  --  108*  BUN 22  --  9  CREATININE 1.37*  --  0.81  CALCIUM 7.5*  --  8.2*   PT/INR No results for input(s): LABPROT, INR in the last 72 hours. CMP     Component Value Date/Time   NA 133* 06/05/2014 0347   NA 137 05/03/2014 1420   K 4.4 06/05/2014 0347   K 4.1 05/03/2014 1420   CL 102 06/05/2014 0347   CL 104 10/03/2012 1048   CO2 21 06/05/2014 0347   CO2 25 05/03/2014 1420   GLUCOSE 108* 06/05/2014 0347   GLUCOSE 135 05/03/2014 1420   GLUCOSE 154* 10/03/2012 1048   BUN 9 06/05/2014 0347   BUN 15.0 05/03/2014 1420   CREATININE 0.81 06/05/2014 0347   CREATININE 0.9 05/03/2014 1420   CALCIUM 8.2* 06/05/2014 0347   CALCIUM 8.9 05/03/2014 1420   PROT 6.2 06/01/2014 0747   PROT 6.6 05/03/2014 1420   ALBUMIN 3.1* 06/01/2014 0747   ALBUMIN 3.3* 05/03/2014 1420   AST 84* 06/01/2014 0747   AST 123* 05/03/2014 1420   ALT 62* 06/01/2014 0747   ALT 97* 05/03/2014 1420   ALKPHOS 78 06/01/2014 0747   ALKPHOS 88 05/03/2014 1420   BILITOT 0.2* 06/01/2014 0747   BILITOT 0.40 05/03/2014 1420   GFRNONAA 77* 06/05/2014 0347   GFRAA 89* 06/05/2014 0347   Lipase     Component Value Date/Time   LIPASE 107* 03/27/2012 1545       Studies/Results: No results found.  Anti-infectives: Anti-infectives    Start     Dose/Rate Route Frequency Ordered Stop   06/01/14 0600  vancomycin (VANCOCIN) IVPB 1000 mg/200 mL premix     1,000 mg200 mL/hr over 60 Minutes Intravenous On call to O.R. 05/31/14 1418 06/01/14 0935       Assessment/Plan Incisional hernia POD #3 s/p laparoscopic converted to open incisional hernia repair with mesh Hypotension Low urine output Acute on chronic anemia s/p pRBC transfusion (2units - for chronic anemia not post-op blood loss)  Plan: 1.  Still with significant pain as expected given size of hernia. 2.  PT consult, but patient refused yesterday  3.  Keep on liquids 4.  D/c foley today 5.  Abdominal binder 6.  Ambulate and IS 7.  SCD's and lovenox 8.  Change dressings, remove honeycomb    LOS: 4 days    DORT, Caila Cirelli 06/05/2014, 7:56 AM Pager: [phone]

## 2014-06-05 NOTE — Plan of Care (Signed)
Problem: Acute Rehab PT Goals(only PT should resolve) Goal: Pt Will Go Supine/Side To Sit Using correct body mechanics Goal: Pt Will Transfer Bed To Chair/Chair To Bed On LRAD with correct body mechanics Goal: Pt Will Ambulate And LLE prosthesis

## 2014-06-06 LAB — GLUCOSE, CAPILLARY
GLUCOSE-CAPILLARY: 96 mg/dL (ref 70–99)
Glucose-Capillary: 83 mg/dL (ref 70–99)
Glucose-Capillary: 93 mg/dL (ref 70–99)
Glucose-Capillary: 111 mg/dL — ABNORMAL HIGH (ref 70–99)
Glucose-Capillary: 102 mg/dL — ABNORMAL HIGH (ref 70–99)

## 2014-06-06 LAB — CLOSTRIDIUM DIFFICILE BY PCR: Toxigenic C. Difficile by PCR: NEGATIVE

## 2014-06-06 MED ORDER — OXYCODONE-ACETAMINOPHEN 5-325 MG PO TABS
1.0000 | ORAL_TABLET | ORAL | Status: DC | PRN
  Administered 2014-06-07 – 2014-06-08 (×2): 2 via ORAL
  Filled 2014-06-06 (×2): qty 2

## 2014-06-06 NOTE — Progress Notes (Signed)
[facility] Progress Note  5 Days Post-Op  Subjective: Pt c/o pain and being tired.  Says she's having diarrhea and its making her miserable.  No N/V, tolerating diet well.  Worked with PT yesterday they want her to go to SNF, but she doesn't want to go.    Objective: Vital signs in last 24 hours: Temp:  [98.3 F (36.8 C)-98.8 F (37.1 C)] 98.4 F (36.9 C) (01/10 0524) Pulse Rate:  [72-99] 73 (01/10 0524) Resp:  [16-20] 16 (01/10 0524) BP: (149-163)/(73-86) 149/73 mmHg (01/10 0524) SpO2:  [97 %-100 %] 97 % (01/10 0524) Last BM Date: 06/05/14  Intake/Output from previous day: 01/09 0701 - 01/10 0700 In: 10 [P.O.:600] Out: 2245 [Urine:1800; Drains:445] Intake/Output this shift:    PE: Gen:  Alert, NAD, very puny looking today Card:  RRR, no M/G/R heard Pulm:  CTA, no W/R/R, IS to 750 Abd: Soft, obese abdomen, ND, tender throughout, +BS, no HSM, incisions C/D/I, B/L LQ drains with sanguinous drainage   Lab Results:   Recent Labs  06/04/14 0826 06/05/14 0347  WBC 11.5* 10.9*  HGB 8.9* 8.9*  HCT 26.9* 27.2*  PLT 200 218   BMET  Recent Labs  06/03/14 1332 06/05/14 0347  NA  --  133*  K 5.2* 4.4  CL  --  102  CO2  --  21  GLUCOSE  --  108*  BUN  --  9  CREATININE  --  0.81  CALCIUM  --  8.2*   PT/INR No results for input(s): LABPROT, INR in the last 72 hours. CMP     Component Value Date/Time   NA 133* 06/05/2014 0347   NA 137 05/03/2014 1420   K 4.4 06/05/2014 0347   K 4.1 05/03/2014 1420   CL 102 06/05/2014 0347   CL 104 10/03/2012 1048   CO2 21 06/05/2014 0347   CO2 25 05/03/2014 1420   GLUCOSE 108* 06/05/2014 0347   GLUCOSE 135 05/03/2014 1420   GLUCOSE 154* 10/03/2012 1048   BUN 9 06/05/2014 0347   BUN 15.0 05/03/2014 1420   CREATININE 0.81 06/05/2014 0347   CREATININE 0.9 05/03/2014 1420   CALCIUM 8.2* 06/05/2014 0347   CALCIUM 8.9 05/03/2014 1420   PROT 6.2 06/01/2014 0747   PROT 6.6 05/03/2014 1420   ALBUMIN 3.1*  06/01/2014 0747   ALBUMIN 3.3* 05/03/2014 1420   AST 84* 06/01/2014 0747   AST 123* 05/03/2014 1420   ALT 62* 06/01/2014 0747   ALT 97* 05/03/2014 1420   ALKPHOS 78 06/01/2014 0747   ALKPHOS 88 05/03/2014 1420   BILITOT 0.2* 06/01/2014 0747   BILITOT 0.40 05/03/2014 1420   GFRNONAA 77* 06/05/2014 0347   GFRAA 89* 06/05/2014 0347   Lipase     Component Value Date/Time   LIPASE 107* 03/27/2012 1545       Studies/Results: No results found.  Anti-infectives: Anti-infectives    Start     Dose/Rate Route Frequency Ordered Stop   06/01/14 0600  vancomycin (VANCOCIN) IVPB 1000 mg/200 mL premix     1,000 mg200 mL/hr over 60 Minutes Intravenous On call to O.R. 05/31/14 1418 06/01/14 0935       Assessment/Plan Incisional hernia POD #5 s/p laparoscopic converted to open incisional hernia repair with mesh Hypotension Low urine output Acute on chronic anemia s/p pRBC transfusion (2 units - for chronic anemia not post-op blood loss)  Plan: 1. Still with significant pain as expected given size of hernia. 2. PT recommending SNF or  24hr supervision 3. On carb mod diet 4. Change dressings 5. Abdominal binder 6. Ambulate and IS 7. SCD's and lovenox 8.  Orals for pain 9.  Hopefully SNF Monday, placed social work consult    LOS: 5 days    DORT, Jinny Blossom 06/06/2014, 7:29 AM Pager: [phone]

## 2014-06-07 LAB — GLUCOSE, CAPILLARY
Glucose-Capillary: 102 mg/dL — ABNORMAL HIGH (ref 70–99)
Glucose-Capillary: 106 mg/dL — ABNORMAL HIGH (ref 70–99)
Glucose-Capillary: 108 mg/dL — ABNORMAL HIGH (ref 70–99)
Glucose-Capillary: 111 mg/dL — ABNORMAL HIGH (ref 70–99)
Glucose-Capillary: 111 mg/dL — ABNORMAL HIGH (ref 70–99)
Glucose-Capillary: 137 mg/dL — ABNORMAL HIGH (ref 70–99)
Glucose-Capillary: 90 mg/dL (ref 70–99)

## 2014-06-07 NOTE — Progress Notes (Signed)
Physical Therapy Treatment Patient Details Name: [name] MRN: [id number] DOB: [birth year] Today's Date: 06/07/2014    History of Present Illness 64 yo female with new ventral hernia repair and previous L AK amputation who has become weak and less safe with mobility, PMHx:  PVD, HTN, diverticulitis, transfusion, Hep C, anxiety    PT Comments    Patient progressing slowly with mobility. Tolerated standing and ambulation with Min guard assist for safety. Requires assist donning prosthesis however not able to get prosthesis to fit correctly secondary to edema in left stump. Limited ambulation distance due to above and concerns for skin breakdown and pain. Pt motivated to return home but would benefit from [facility] to improve functional mobility and safety prior to return home. If continues to refuse SNF, pt would required increased assist from caregivers and HHPT.   Follow Up Recommendations  SNF;Supervision/Assistance - 24 hour (unless pt has 24/7 S at home, would need HHPT)     Equipment Recommendations  None recommended by PT    Recommendations for Other Services       Precautions / Restrictions Precautions Precautions: Fall;Other (comment) Precaution Comments: abd binder to protect hernia repair Required Braces or Orthoses: Other Brace/Splint Restrictions Weight Bearing Restrictions: No    Mobility  Bed Mobility Overal bed mobility: Needs Assistance Bed Mobility: Supine to Sit;Rolling Rolling: Min guard   Supine to sit: Min guard;HOB elevated     General bed mobility comments: Min guard for safety. Use of rails for support. Cues for log roll technique however pt pulling self up with rails.  Transfers Overall transfer level: Needs assistance Equipment used: Rolling walker (2 wheeled) Transfers: Sit to/from Stand Sit to Stand: Min guard         General transfer comment: Stood from elevated bed height x1, from chair x1. Transferred to chair  x1.  Ambulation/Gait Ambulation/Gait assistance: Min guard Ambulation Distance (Feet): 10 Feet (+ 20') Assistive device: Rolling walker (2 wheeled) Gait Pattern/deviations: Step-through pattern;Decreased stride length;Trunk flexed;Step-to pattern;Decreased step length - right   Gait velocity interpretation: Below normal speed for age/gender General Gait Details: Ambulation distance limited secondary to prosthesis not fitting correctly on LLE. Pt not able to get stump fully into prosthesis resulting in discomfort/pain. Therapist limited ambulation distance.    Stairs            Wheelchair Mobility    Modified Rankin (Stroke Patients Only)       Balance Overall balance assessment: Needs assistance Sitting-balance support: Feet supported;No upper extremity supported Sitting balance-Leahy Scale: Fair Sitting balance - Comments: Required Min A to donn prosthesis on LLE sitting EOB. Able to weightshift without LOB. Increased time to try to get prosthesis to fit correctly.   Standing balance support: During functional activity Standing balance-Leahy Scale: Poor Standing balance comment: Requires BUE support on RW for balance/safety.                    Cognition Arousal/Alertness: Awake/alert Behavior During Therapy: WFL for tasks assessed/performed Overall Cognitive Status: No family/caregiver present to determine baseline cognitive functioning         Following Commands: Follows multi-step commands with increased time            Exercises      General Comments General comments (skin integrity, edema, etc.): RN made aware of edema in Left stump affecting correct fitting of prosthesis. Pt not able to get prosthesis donned completely. Educated pt to reduce ambulation due to increased risk of  pressure sores due to ill fitting prosthesis. RN notified to get in touch with prosthetist to address issue.      Pertinent Vitals/Pain Pain Assessment: 0-10 Pain Score:   (not rated on pain scale.) Pain Location: L stump when standing/ambulating Pain Descriptors / Indicators: Sore;Sharp Pain Intervention(s): Limited activity within patient's tolerance;Monitored during session;Repositioned    Home Living                      Prior Function            PT Goals (current goals can now be found in the care plan section) Progress towards PT goals: Progressing toward goals    Frequency  Min 3X/week    PT Plan Current plan remains appropriate    Co-evaluation             End of Session Equipment Utilized During Treatment: Gait belt Activity Tolerance: Patient tolerated treatment well Patient left: in chair;with call bell/phone within reach;with nursing/sitter in room     Time: 1200-1232 PT Time Calculation (min) (ACUTE ONLY): 32 min  Charges:  $Gait Training: 8-22 mins $Therapeutic Activity: 8-22 mins                    G CodesCandy Sledge A June 09, 2014, 1:15 PM Candy Sledge, PT, DPT [phone]

## 2014-06-07 NOTE — Progress Notes (Signed)
6 Days Post-Op  Subjective: Feeling better  Objective: Vital signs in last 24 hours: Temp:  [98.6 F (37 C)-99.5 F (37.5 C)] 99.3 F (37.4 C) (01/11 0603) Pulse Rate:  [70-84] 84 (01/11 0603) Resp:  [17-20] 20 (01/11 0603) BP: (145-154)/(74-78) 145/76 mmHg (01/11 0603) SpO2:  [90 %-100 %] 90 % (01/11 0603) Last BM Date: 06/06/14  Intake/Output from previous day: 01/10 0701 - 01/11 0700 In: 1401.3 [P.O.:480; I.V.:921.3] Out: 790 [Urine:400; Drains:390] Intake/Output this shift:    Abdomen soft, dressing intact Incision clean Drains serosang  Lab Results:   Recent Labs  06/04/14 0826 06/05/14 0347  WBC 11.5* 10.9*  HGB 8.9* 8.9*  HCT 26.9* 27.2*  PLT 200 218   BMET  Recent Labs  06/05/14 0347  NA 133*  K 4.4  CL 102  CO2 21  GLUCOSE 108*  BUN 9  CREATININE 0.81  CALCIUM 8.2*   PT/INR No results for input(s): LABPROT, INR in the last 72 hours. ABG No results for input(s): PHART, HCO3 in the last 72 hours.  Invalid input(s): PCO2, PO2  Studies/Results: No results found.  Anti-infectives: Anti-infectives    Start     Dose/Rate Route Frequency Ordered Stop   06/01/14 0600  vancomycin (VANCOCIN) IVPB 1000 mg/200 mL premix     1,000 mg200 mL/hr over 60 Minutes Intravenous On call to O.R. 05/31/14 1418 06/01/14 0935      Assessment/Plan: s/p Procedure(s): INSERTION OF MESH (N/A) ATTEMPTED LAPAROSCOPIC AND OPEN INCISIONAL HERNIA REPAIR WITH MESH (N/A)   Pt improving  She refuses SNF Potential d/c tomorrow with home health for drain care and home PT  LOS: 6 days    Oluwadamilola Deliz A 06/07/2014

## 2014-06-07 NOTE — Clinical Social Work Note (Signed)
Per RNCM, pt refusing SNF placement at discharge. RNCM reports pt to be discharged home with home health services once medically stable for discharge. CSW signing off. Thank you.  Lubertha Sayres, [place] ([phone]) Licensed Clinical Social Worker Orthopedics [phone]) and Surgical [phone])

## 2014-06-08 LAB — GLUCOSE, CAPILLARY
GLUCOSE-CAPILLARY: 115 mg/dL — AB (ref 70–99)
Glucose-Capillary: 109 mg/dL — ABNORMAL HIGH (ref 70–99)
Glucose-Capillary: 190 mg/dL — ABNORMAL HIGH (ref 70–99)

## 2014-06-08 MED ORDER — OXYCODONE-ACETAMINOPHEN 5-325 MG PO TABS: 1.0000 | ORAL_TABLET | ORAL | Status: DC | PRN

## 2014-06-08 NOTE — Progress Notes (Signed)
Discharge instructions reviewed with patient. Demonstrated with pt how to empty and charge her JP drain. Went over all medications and gave Rx. Pt verbalizes understanding and would like to go over JP teaching with her ride when she arrives. Pt ready for discharge.

## 2014-06-08 NOTE — Progress Notes (Signed)
Patient ID: [name], female   DOB: February 08, 1951, 64 y.o.   MRN: [id number]  She is much more comfortable  Again she is refusing SNF despite our reasonings for going there.  Will discharge home with home health per her request

## 2014-06-08 NOTE — Discharge Instructions (Signed)
[facility] _______[facility]  UMBILICAL OR INGUINAL HERNIA REPAIR: POST OP INSTRUCTIONS  Always review your discharge instruction sheet given to you by the facility where your surgery was performed. IF YOU HAVE DISABILITY OR FAMILY LEAVE FORMS, YOU MUST BRING THEM TO THE OFFICE FOR PROCESSING.   DO NOT GIVE THEM TO YOUR DOCTOR.  1. A  prescription for pain medication may be given to you upon discharge.  Take your pain medication as prescribed, if needed.  If narcotic pain medicine is not needed, then you may take acetaminophen (Tylenol) or ibuprofen (Advil) as needed. 2. Take your usually prescribed medications unless otherwise directed. 3. If you need a refill on your pain medication, please contact your pharmacy.  They will contact our office to request authorization. Prescriptions will not be filled after 5 pm or on week-ends. 4. You should follow a light diet the first 24 hours after arrival home, such as soup and crackers, etc.  Be sure to include lots of fluids daily.  Resume your normal diet the day after surgery. 5. Most patients will experience some swelling and bruising around the umbilicus or in the groin and scrotum.  Ice packs and reclining will help.  Swelling and bruising can take several days to resolve.  6. It is common to experience some constipation if taking pain medication after surgery.  Increasing fluid intake and taking a stool softener (such as Colace) will usually help or prevent this problem from occurring.  A mild laxative (Milk of Magnesia or Miralax) should be taken according to package directions if there are no bowel movements after 48 hours. 7. Unless discharge instructions indicate otherwise, you may remove your bandages 24-48 hours after surgery, and you may shower at that time.  You may have steri-strips (small skin tapes) in place directly over the incision.  These strips should be left on the skin for 7-10 days.  If your surgeon used skin glue on the  incision, you may shower in 24 hours.  The glue will flake off over the next 2-3 weeks.  Any sutures or staples will be removed at the office during your follow-up visit. 8. ACTIVITIES:  You may resume regular (light) daily activities beginning the next day--such as daily self-care, walking, climbing stairs--gradually increasing activities as tolerated.  You may have sexual intercourse when it is comfortable.  Refrain from any heavy lifting or straining until approved by your doctor. a. You may drive when you are no longer taking prescription pain medication, you can comfortably wear a seatbelt, and you can safely maneuver your car and apply brakes. b. RETURN TO WORK:  __________________________________________________________ 9. You should see your doctor in the office for a follow-up appointment approximately 2-3 weeks after your surgery.  Make sure that you call for this appointment within a day or two after you arrive home to insure a convenient appointment time. 10. OTHER INSTRUCTIONS:  __________________________________________________________________________________________________________________________________________________________________________________________  WHEN TO CALL YOUR DOCTOR: 1. Fever over 101.0 2. Inability to urinate 3. Nausea and/or vomiting 4. Extreme swelling or bruising 5. Continued bleeding from incision. 6. Increased pain, redness, or drainage from the incision  The clinic staff is available to answer your questions during regular business hours.  Please don't hesitate to call and ask to speak to one of the nurses for clinical concerns.  If you have a medical emergency, go to the nearest emergency room or call 911.  A surgeon from [facility] is always on call at the hospital     [address], [address], [place], [place]  [address] ?  [address] [address], [place], [place]   [address] [phone] ? [phone] ? FAX [phone] Web site:  [email or web address]  

## 2014-06-08 NOTE — Discharge Summary (Signed)
Physician Discharge Summary  Patient ID: [name] MRN: [id number] DOB/AGE: 1951-03-04 64 y.o.  Admit date: 06/01/2014 Discharge date: 06/08/2014  Admission Diagnoses:  Discharge Diagnoses:  Active Problems:   Postoperative incisional hernia   Discharged Condition: fair  Hospital Course: admitted after elective hernia repair.  Transfused for chronic blood loss anemia.  Improved slowly.  PT consulted.  Became ready for discharge.  Discharge home with home health after refusing SNF.  Consults: None  Significant Diagnostic Studies:   Treatments: surgery: lap converted to open incisional hernia repair with mesh  Discharge Exam: Blood pressure 148/69, pulse 73, temperature 98.5 F (36.9 C), temperature source Oral, resp. rate 19, height 5\' 3"  (1.6 m), weight 187 lb 15.8 oz (85.27 kg), SpO2 100 %. General appearance: alert, cooperative and no distress Resp: clear to auscultation bilaterally Cardio: regular rate and rhythm, S1, S2 normal, no murmur, click, rub or gallop Incision/Wound:abdomen soft, drains serosang, incision clean  Disposition: 01-Home or Self Care     Medication List    TAKE these medications        ACCU-CHEK FASTCLIX LANCETS Misc  Check your blood sugar one time a day     ALREX 0.2 % Susp  Generic drug:  loteprednol     aspirin EC 81 MG tablet  Take 81 mg by mouth daily.     atenolol 50 MG tablet  Commonly known as:  TENORMIN  Take 50 mg by mouth every morning.     cetirizine 10 MG tablet  Commonly known as:  ZYRTEC  Take 10 mg by mouth every morning.     cyclobenzaprine 10 MG tablet  Commonly known as:  FLEXERIL  Take 1 tablet (10 mg total) by mouth 2 (two) times daily as needed for muscle spasms.     esomeprazole 40 MG capsule  Commonly known as:  NEXIUM  Take 40 mg by mouth 2 (two) times daily.     fluorometholone 0.1 % ophthalmic suspension  Commonly known as:  FML  Place 1 drop into the left eye 2 (two) times daily.     FLUoxetine  40 MG capsule  Commonly known as:  PROZAC  TAKE 1 CAPSULE BY MOUTH DAILY     gabapentin 400 MG capsule  Commonly known as:  NEURONTIN  Take 800 mg by mouth 3 (three) times daily.     glucose blood test strip  Commonly known as:  ACCU-CHEK SMARTVIEW  Check blood sugar  one time a day     hydrochlorothiazide 25 MG tablet  Commonly known as:  HYDRODIURIL  Take 25 mg by mouth every morning.     lactose free nutrition Liqd  Take 237 mLs by mouth 3 (three) times daily with meals.     lisinopril 40 MG tablet  Commonly known as:  PRINIVIL,ZESTRIL  Take 1 tablet (40 mg total) by mouth every morning.     magnesium (amino acid chelate) 133 MG tablet  Take 1 tablet by mouth 2 (two) times daily.     metFORMIN 500 MG tablet  Commonly known as:  GLUCOPHAGE  Take 500 mg by mouth daily with breakfast.     oxyCODONE-acetaminophen 10-325 MG per tablet  Commonly known as:  PERCOCET  Take 1 tablet by mouth every 6 (six) hours as needed for pain.     oxyCODONE-acetaminophen 5-325 MG per tablet  Commonly known as:  PERCOCET/ROXICET  Take 1-2 tablets by mouth every 4 (four) hours as needed for moderate pain or severe pain.  potassium chloride 20 MEQ/15ML (10%) Soln  TAKE 15ML BY MOUTH EVERY DAY     prednisoLONE acetate 1 % ophthalmic suspension  Commonly known as:  PRED FORTE  Place 1 drop into the left eye 3 (three) times daily.     traMADol 50 MG tablet  Commonly known as:  ULTRAM  Take 1-2 tablets (50-100 mg total) by mouth every 6 (six) hours as needed.     traZODone 100 MG tablet  Commonly known as:  DESYREL  Take 100 mg by mouth at bedtime.     vitamin C 500 MG tablet  Commonly known as:  ASCORBIC ACID  Take 500 mg by mouth every morning.           Follow-up Information    Follow up with [facility], MD. Schedule an appointment as soon as possible for a visit on 06/15/2014.   Specialty:  General Surgery   Why:  For suture removal   Contact information:   [address] [place] [address] [phone]       Signed: Harl Bowie 06/08/2014, 10:39 AM

## 2014-06-09 DIAGNOSIS — Z48815 Encounter for surgical aftercare following surgery on the digestive system: Secondary | ICD-10-CM | POA: Diagnosis not present

## 2014-06-09 DIAGNOSIS — E119 Type 2 diabetes mellitus without complications: Secondary | ICD-10-CM | POA: Diagnosis not present

## 2014-06-09 DIAGNOSIS — K7469 Other cirrhosis of liver: Secondary | ICD-10-CM | POA: Diagnosis not present

## 2014-06-10 DIAGNOSIS — Z48815 Encounter for surgical aftercare following surgery on the digestive system: Secondary | ICD-10-CM | POA: Diagnosis not present

## 2014-06-10 DIAGNOSIS — K7469 Other cirrhosis of liver: Secondary | ICD-10-CM | POA: Diagnosis not present

## 2014-06-10 DIAGNOSIS — E119 Type 2 diabetes mellitus without complications: Secondary | ICD-10-CM | POA: Diagnosis not present

## 2014-06-11 DIAGNOSIS — E119 Type 2 diabetes mellitus without complications: Secondary | ICD-10-CM | POA: Diagnosis not present

## 2014-06-11 DIAGNOSIS — K7469 Other cirrhosis of liver: Secondary | ICD-10-CM | POA: Diagnosis not present

## 2014-06-11 DIAGNOSIS — Z48815 Encounter for surgical aftercare following surgery on the digestive system: Secondary | ICD-10-CM | POA: Diagnosis not present

## 2014-06-14 DIAGNOSIS — E119 Type 2 diabetes mellitus without complications: Secondary | ICD-10-CM | POA: Diagnosis not present

## 2014-06-14 DIAGNOSIS — K7469 Other cirrhosis of liver: Secondary | ICD-10-CM | POA: Diagnosis not present

## 2014-06-14 DIAGNOSIS — Z48815 Encounter for surgical aftercare following surgery on the digestive system: Secondary | ICD-10-CM | POA: Diagnosis not present

## 2014-06-16 DIAGNOSIS — Z48815 Encounter for surgical aftercare following surgery on the digestive system: Secondary | ICD-10-CM | POA: Diagnosis not present

## 2014-06-16 DIAGNOSIS — E119 Type 2 diabetes mellitus without complications: Secondary | ICD-10-CM | POA: Diagnosis not present

## 2014-06-16 DIAGNOSIS — K7469 Other cirrhosis of liver: Secondary | ICD-10-CM | POA: Diagnosis not present

## 2014-06-17 ENCOUNTER — Inpatient Hospital Stay (HOSPITAL_COMMUNITY)
Admission: EM | Admit: 2014-06-17 | Discharge: 2014-06-28 | DRG: 863 | Disposition: A | Discharge status: Home Health | Payer: Medicare Other | Attending: Surgery | Admitting: Surgery

## 2014-06-17 ENCOUNTER — Encounter (HOSPITAL_COMMUNITY): Payer: Self-pay | Admitting: Emergency Medicine

## 2014-06-17 DIAGNOSIS — Z880 Allergy status to penicillin: Secondary | ICD-10-CM

## 2014-06-17 DIAGNOSIS — K7469 Other cirrhosis of liver: Secondary | ICD-10-CM | POA: Diagnosis not present

## 2014-06-17 DIAGNOSIS — Z885 Allergy status to narcotic agent status: Secondary | ICD-10-CM

## 2014-06-17 DIAGNOSIS — Z8673 Personal history of transient ischemic attack (TIA), and cerebral infarction without residual deficits: Secondary | ICD-10-CM

## 2014-06-17 DIAGNOSIS — Z888 Allergy status to other drugs, medicaments and biological substances status: Secondary | ICD-10-CM | POA: Diagnosis not present

## 2014-06-17 DIAGNOSIS — Z79891 Long term (current) use of opiate analgesic: Secondary | ICD-10-CM | POA: Diagnosis not present

## 2014-06-17 DIAGNOSIS — T8130XA Disruption of wound, unspecified, initial encounter: Secondary | ICD-10-CM | POA: Diagnosis present

## 2014-06-17 DIAGNOSIS — I10 Essential (primary) hypertension: Secondary | ICD-10-CM | POA: Diagnosis present

## 2014-06-17 DIAGNOSIS — E871 Hypo-osmolality and hyponatremia: Secondary | ICD-10-CM | POA: Diagnosis present

## 2014-06-17 DIAGNOSIS — E876 Hypokalemia: Secondary | ICD-10-CM | POA: Diagnosis present

## 2014-06-17 DIAGNOSIS — IMO0001 Reserved for inherently not codable concepts without codable children: Secondary | ICD-10-CM

## 2014-06-17 DIAGNOSIS — T8131XA Disruption of external operation (surgical) wound, not elsewhere classified, initial encounter: Secondary | ICD-10-CM | POA: Diagnosis not present

## 2014-06-17 DIAGNOSIS — Z7982 Long term (current) use of aspirin: Secondary | ICD-10-CM

## 2014-06-17 DIAGNOSIS — K219 Gastro-esophageal reflux disease without esophagitis: Secondary | ICD-10-CM | POA: Diagnosis not present

## 2014-06-17 DIAGNOSIS — Z89619 Acquired absence of unspecified leg above knee: Secondary | ICD-10-CM

## 2014-06-17 DIAGNOSIS — I739 Peripheral vascular disease, unspecified: Secondary | ICD-10-CM | POA: Diagnosis present

## 2014-06-17 DIAGNOSIS — Z881 Allergy status to other antibiotic agents status: Secondary | ICD-10-CM

## 2014-06-17 DIAGNOSIS — F17210 Nicotine dependence, cigarettes, uncomplicated: Secondary | ICD-10-CM | POA: Diagnosis present

## 2014-06-17 DIAGNOSIS — L0390 Cellulitis, unspecified: Secondary | ICD-10-CM | POA: Diagnosis present

## 2014-06-17 DIAGNOSIS — E119 Type 2 diabetes mellitus without complications: Secondary | ICD-10-CM | POA: Diagnosis not present

## 2014-06-17 DIAGNOSIS — R0602 Shortness of breath: Secondary | ICD-10-CM | POA: Diagnosis not present

## 2014-06-17 DIAGNOSIS — T814XXA Infection following a procedure, initial encounter: Secondary | ICD-10-CM | POA: Diagnosis not present

## 2014-06-17 DIAGNOSIS — Z79899 Other long term (current) drug therapy: Secondary | ICD-10-CM | POA: Diagnosis not present

## 2014-06-17 DIAGNOSIS — S31109A Unspecified open wound of abdominal wall, unspecified quadrant without penetration into peritoneal cavity, initial encounter: Secondary | ICD-10-CM | POA: Diagnosis not present

## 2014-06-17 DIAGNOSIS — Z48815 Encounter for surgical aftercare following surgery on the digestive system: Secondary | ICD-10-CM | POA: Diagnosis not present

## 2014-06-17 DIAGNOSIS — T8149XA Infection following a procedure, other surgical site, initial encounter: Secondary | ICD-10-CM | POA: Diagnosis present

## 2014-06-17 LAB — CBC WITH DIFFERENTIAL/PLATELET
BASOS ABS: 0 10*3/uL (ref 0.0–0.1)
Basophils Relative: 0 % (ref 0–1)
EOS PCT: 1 % (ref 0–5)
Eosinophils Absolute: 0.1 10*3/uL (ref 0.0–0.7)
HEMATOCRIT: 31.8 % — AB (ref 36.0–46.0)
Hemoglobin: 10.2 g/dL — ABNORMAL LOW (ref 12.0–15.0)
LYMPHS PCT: 20 % (ref 12–46)
Lymphs Abs: 1.9 10*3/uL (ref 0.7–4.0)
MCH: 28.3 pg (ref 26.0–34.0)
MCHC: 32.1 g/dL (ref 30.0–36.0)
MCV: 88.3 fL (ref 78.0–100.0)
Monocytes Absolute: 1.6 10*3/uL — ABNORMAL HIGH (ref 0.1–1.0)
Monocytes Relative: 16 % — ABNORMAL HIGH (ref 3–12)
NEUTROS ABS: 5.9 10*3/uL (ref 1.7–7.7)
NEUTROS PCT: 63 % (ref 43–77)
Platelets: 499 10*3/uL — ABNORMAL HIGH (ref 150–400)
RBC: 3.6 MIL/uL — ABNORMAL LOW (ref 3.87–5.11)
RDW: 15.4 % (ref 11.5–15.5)
WBC: 9.5 10*3/uL (ref 4.0–10.5)

## 2014-06-17 LAB — BASIC METABOLIC PANEL
Anion gap: 10 (ref 5–15)
BUN: 11 mg/dL (ref 6–23)
CHLORIDE: 98 meq/L (ref 96–112)
CO2: 26 mmol/L (ref 19–32)
CREATININE: 0.86 mg/dL (ref 0.50–1.10)
Calcium: 7.6 mg/dL — ABNORMAL LOW (ref 8.4–10.5)
GFR, EST AFRICAN AMERICAN: 83 mL/min — AB (ref >90)
GFR, EST NON AFRICAN AMERICAN: 71 mL/min — AB (ref >90)
GLUCOSE: 144 mg/dL — AB (ref 70–99)
Potassium: 3.1 mmol/L — ABNORMAL LOW (ref 3.5–5.1)
Sodium: 134 mmol/L — ABNORMAL LOW (ref 135–145)

## 2014-06-17 LAB — GLUCOSE, CAPILLARY: Glucose-Capillary: 117 mg/dL — ABNORMAL HIGH (ref 70–99)

## 2014-06-17 MED ORDER — VITAMIN C 500 MG PO TABS
500.0000 mg | ORAL_TABLET | Freq: Every morning | ORAL | Status: DC
  Administered 2014-06-18 – 2014-06-28 (×11): 500 mg via ORAL
  Filled 2014-06-17 (×11): qty 1

## 2014-06-17 MED ORDER — DIPHENHYDRAMINE HCL 12.5 MG/5ML PO ELIX
12.5000 mg | ORAL_SOLUTION | Freq: Four times a day (QID) | ORAL | Status: DC | PRN
  Administered 2014-06-23: 25 mg via ORAL
  Filled 2014-06-17: qty 10

## 2014-06-17 MED ORDER — CLINDAMYCIN PHOSPHATE 600 MG/50ML IV SOLN
600.0000 mg | Freq: Three times a day (TID) | INTRAVENOUS | Status: DC
  Administered 2014-06-17 – 2014-06-24 (×20): 600 mg via INTRAVENOUS
  Filled 2014-06-17 (×23): qty 50

## 2014-06-17 MED ORDER — FLUOROMETHOLONE 0.1 % OP SUSP
1.0000 [drp] | Freq: Two times a day (BID) | OPHTHALMIC | Status: DC
  Administered 2014-06-17 – 2014-06-18 (×3): 1 [drp] via OPHTHALMIC
  Filled 2014-06-17: qty 5

## 2014-06-17 MED ORDER — ACETAMINOPHEN 650 MG RE SUPP: 650.0000 mg | Freq: Four times a day (QID) | RECTAL | Status: DC | PRN

## 2014-06-17 MED ORDER — ACETAMINOPHEN 325 MG PO TABS
650.0000 mg | ORAL_TABLET | Freq: Four times a day (QID) | ORAL | Status: DC | PRN
  Administered 2014-06-20: 650 mg via ORAL
  Filled 2014-06-17: qty 2

## 2014-06-17 MED ORDER — ONDANSETRON HCL 4 MG/2ML IJ SOLN: 4.0000 mg | INTRAMUSCULAR | Status: DC | PRN

## 2014-06-17 MED ORDER — TRAZODONE HCL 100 MG PO TABS
100.0000 mg | ORAL_TABLET | Freq: Every day | ORAL | Status: DC
  Administered 2014-06-17 – 2014-06-27 (×11): 100 mg via ORAL
  Filled 2014-06-17 (×17): qty 1

## 2014-06-17 MED ORDER — TRAMADOL HCL 50 MG PO TABS
50.0000 mg | ORAL_TABLET | Freq: Four times a day (QID) | ORAL | Status: DC | PRN
  Administered 2014-06-19 – 2014-06-21 (×4): 50 mg via ORAL
  Administered 2014-06-22: 100 mg via ORAL
  Administered 2014-06-22: 50 mg via ORAL
  Administered 2014-06-26: 100 mg via ORAL
  Filled 2014-06-17: qty 2
  Filled 2014-06-17 (×2): qty 1
  Filled 2014-06-17: qty 2
  Filled 2014-06-17 (×3): qty 1

## 2014-06-17 MED ORDER — GABAPENTIN 400 MG PO CAPS
800.0000 mg | ORAL_CAPSULE | Freq: Three times a day (TID) | ORAL | Status: DC
Start: 2014-06-17 — End: 2014-06-28
  Administered 2014-06-17 – 2014-06-28 (×31): 800 mg via ORAL
  Filled 2014-06-17 (×37): qty 2

## 2014-06-17 MED ORDER — ASPIRIN EC 81 MG PO TBEC
81.0000 mg | DELAYED_RELEASE_TABLET | Freq: Every day | ORAL | Status: DC
  Administered 2014-06-18 – 2014-06-28 (×11): 81 mg via ORAL
  Filled 2014-06-17 (×11): qty 1

## 2014-06-17 MED ORDER — FLUOXETINE HCL 20 MG PO CAPS
40.0000 mg | ORAL_CAPSULE | Freq: Every day | ORAL | Status: DC
  Administered 2014-06-18 – 2014-06-28 (×11): 40 mg via ORAL
  Filled 2014-06-17 (×12): qty 2

## 2014-06-17 MED ORDER — FENTANYL CITRATE 0.05 MG/ML IJ SOLN
25.0000 ug | INTRAMUSCULAR | Status: DC | PRN
  Administered 2014-06-23 (×2): 25 ug via INTRAVENOUS
  Filled 2014-06-17 (×2): qty 2

## 2014-06-17 MED ORDER — INSULIN ASPART 100 UNIT/ML ~~LOC~~ SOLN: 0.0000 [IU] | Freq: Every day | SUBCUTANEOUS | Status: DC

## 2014-06-17 MED ORDER — ONDANSETRON HCL 4 MG/2ML IJ SOLN
4.0000 mg | Freq: Four times a day (QID) | INTRAMUSCULAR | Status: DC | PRN
  Administered 2014-06-24 – 2014-06-27 (×4): 4 mg via INTRAVENOUS
  Filled 2014-06-17 (×4): qty 2

## 2014-06-17 MED ORDER — ATENOLOL 50 MG PO TABS
50.0000 mg | ORAL_TABLET | Freq: Every morning | ORAL | Status: DC
  Administered 2014-06-18 – 2014-06-28 (×10): 50 mg via ORAL
  Filled 2014-06-17 (×11): qty 1

## 2014-06-17 MED ORDER — OXYCODONE HCL 5 MG PO TABS
5.0000 mg | ORAL_TABLET | ORAL | Status: DC | PRN
  Administered 2014-06-18 (×2): 10 mg via ORAL
  Administered 2014-06-19: 5 mg via ORAL
  Administered 2014-06-19 – 2014-06-28 (×17): 10 mg via ORAL
  Filled 2014-06-17 (×21): qty 2

## 2014-06-17 MED ORDER — METFORMIN HCL 500 MG PO TABS
500.0000 mg | ORAL_TABLET | Freq: Every day | ORAL | Status: DC
  Administered 2014-06-18 – 2014-06-28 (×10): 500 mg via ORAL
  Filled 2014-06-17 (×13): qty 1

## 2014-06-17 MED ORDER — HYDROCHLOROTHIAZIDE 25 MG PO TABS
25.0000 mg | ORAL_TABLET | Freq: Every morning | ORAL | Status: DC
  Administered 2014-06-18 – 2014-06-28 (×10): 25 mg via ORAL
  Filled 2014-06-17 (×11): qty 1

## 2014-06-17 MED ORDER — DIPHENHYDRAMINE HCL 50 MG/ML IJ SOLN: 12.5000 mg | Freq: Four times a day (QID) | INTRAMUSCULAR | Status: DC | PRN

## 2014-06-17 MED ORDER — CYCLOBENZAPRINE HCL 10 MG PO TABS
10.0000 mg | ORAL_TABLET | Freq: Two times a day (BID) | ORAL | Status: DC | PRN
  Administered 2014-06-19 – 2014-06-20 (×2): 10 mg via ORAL
  Filled 2014-06-17 (×3): qty 1

## 2014-06-17 MED ORDER — LISINOPRIL 40 MG PO TABS
40.0000 mg | ORAL_TABLET | Freq: Every morning | ORAL | Status: DC
  Administered 2014-06-18 – 2014-06-28 (×10): 40 mg via ORAL
  Filled 2014-06-17 (×11): qty 1

## 2014-06-17 MED ORDER — PREDNISOLONE ACETATE 1 % OP SUSP
1.0000 [drp] | Freq: Three times a day (TID) | OPHTHALMIC | Status: DC
Start: 2014-06-17 — End: 2014-06-18
  Administered 2014-06-17 – 2014-06-18 (×2): 1 [drp] via OPHTHALMIC
  Filled 2014-06-17: qty 1

## 2014-06-17 MED ORDER — PANTOPRAZOLE SODIUM 40 MG IV SOLR
40.0000 mg | Freq: Every day | INTRAVENOUS | Status: DC
  Administered 2014-06-17 – 2014-06-18 (×2): 40 mg via INTRAVENOUS
  Filled 2014-06-17 (×3): qty 40

## 2014-06-17 MED ORDER — INSULIN ASPART 100 UNIT/ML ~~LOC~~ SOLN
0.0000 [IU] | Freq: Three times a day (TID) | SUBCUTANEOUS | Status: DC
Start: 2014-06-18 — End: 2014-06-28
  Administered 2014-06-18 – 2014-06-19 (×2): 2 [IU] via SUBCUTANEOUS
  Administered 2014-06-19: 3 [IU] via SUBCUTANEOUS
  Administered 2014-06-20 – 2014-06-22 (×2): 2 [IU] via SUBCUTANEOUS
  Administered 2014-06-22: 3 [IU] via SUBCUTANEOUS
  Administered 2014-06-25 – 2014-06-27 (×3): 2 [IU] via SUBCUTANEOUS

## 2014-06-17 MED ORDER — HEPARIN SODIUM (PORCINE) 5000 UNIT/ML IJ SOLN
5000.0000 [IU] | Freq: Three times a day (TID) | INTRAMUSCULAR | Status: DC
  Administered 2014-06-17 – 2014-06-28 (×29): 5000 [IU] via SUBCUTANEOUS
  Filled 2014-06-17 (×39): qty 1

## 2014-06-17 NOTE — ED Notes (Signed)
Pt reports hernia repair on Jan. 5th. sts incision started opening yesterday. Home health nurse sent pt here for further evaluation. sts she had fever at home- no fever in traige.

## 2014-06-17 NOTE — ED Provider Notes (Signed)
CSN: [id number]     Arrival date & time 06/17/14  1658 History   First MD Initiated Contact with Patient 06/17/14 1757     Chief Complaint  Patient presents with  . Post-op Problem     (Consider location/radiation/quality/duration/timing/severity/associated sxs/prior Treatment) The history is provided by the patient.   64 year old female status post ventral hernia repair on January 5 by [facility], Dr. Ninfa Linden, patient did have mesh placed in the wound. Patient seen in the surgical office on Tuesday staples removed wound opened up so undergoing wet-to-dry dressing changes. Patient does have a home health nurse. Today she noted that there was a change in the wound and patient was referred in for evaluation. Patient has 2 grenade type drain bulbs. Patient states that she's felt feverish but no documented fever does have abdominal pain but no nausea no vomiting.  Past Medical History  Diagnosis Date  . Hypertension   . Colon polyps   . Stomach problems   . Hypokalemic alkalosis   . Allergic rhinitis   . GERD (gastroesophageal reflux disease)   . Stroke 2010    no residual problems  . Anemia, iron deficiency 05/03/2011    recieves iron infusions  . Anemia   . GI bleeding   . Diverticulitis   . Transfusion history   . Difficulty sleeping     takes trazadone for sleep  . Peripheral vascular disease   . Hepatitis     hep C  . Anxiety   . Shortness of breath     due to anemia   Past Surgical History  Procedure Laterality Date  . Leg amputation above knee    . Esophagogastroduodenoscopy  05/05/2011    Procedure: ESOPHAGOGASTRODUODENOSCOPY (EGD);  Surgeon: Zenovia Jarred, MD;  Location: [facility];  Service: Gastroenterology;  Laterality: N/A;  Dr. Hilarie Fredrickson will do procedure for Dr. Benson Norway Saturday.  . Esophagogastroduodenoscopy  05/08/2011    Procedure: ESOPHAGOGASTRODUODENOSCOPY (EGD);  Surgeon: Beryle Beams;  Location: [facility];  Service: Endoscopy;  Laterality: N/A;   . Esophagogastroduodenoscopy  06/07/2011    Procedure: ESOPHAGOGASTRODUODENOSCOPY (EGD);  Surgeon: Beryle Beams, MD;  Location: [facility];  Service: Endoscopy;  Laterality: N/A;  . Hot hemostasis  06/07/2011    Procedure: HOT HEMOSTASIS (ARGON PLASMA COAGULATION/BICAP);  Surgeon: Beryle Beams, MD;  Location: [facility];  Service: Endoscopy;  Laterality: N/A;  . Esophagogastroduodenoscopy  12/20/2011    Procedure: ESOPHAGOGASTRODUODENOSCOPY (EGD);  Surgeon: Beryle Beams, MD;  Location: [facility];  Service: Endoscopy;  Laterality: N/A;  . Flexible sigmoidoscopy  12/21/2011    Procedure: FLEXIBLE SIGMOIDOSCOPY;  Surgeon: Beryle Beams, MD;  Location: [facility];  Service: Endoscopy;  Laterality: N/A;  . Carpal tunnel release      rt hand  . Partial colectomy  02/15/2012    Procedure: PARTIAL COLECTOMY;  Surgeon: Harl Bowie, MD;  Location: [facility];  Service: General;  Laterality: N/A;  . Laparotomy  02/16/2012    Procedure: EXPLORATORY LAPAROTOMY;  Surgeon: Edward Jolly, MD;  Location: [facility];  Service: General;  Laterality: N/A;  oversewing of anastomotic leak and rigid sigmoidoscopy  . Enteroscopy N/A 12/04/2012    Procedure: ENTEROSCOPY;  Surgeon: Beryle Beams, MD;  Location: [facility];  Service: Endoscopy;  Laterality: N/A;  . Enteroscopy N/A 12/18/2012    Procedure: ENTEROSCOPY;  Surgeon: Beryle Beams, MD;  Location: [facility];  Service: Endoscopy;  Laterality: N/A;  . Eye surgery Bilateral     cataracts  .  Tonsillectomy    . Abdominal hysterectomy    . Insertion of mesh N/A 06/01/2014    Procedure: INSERTION OF MESH;  Surgeon: Coralie Keens, MD;  Location: [facility];  Service: General;  Laterality: N/A;  . Incisional hernia repair N/A 06/01/2014    Procedure: ATTEMPTED LAPAROSCOPIC AND OPEN INCISIONAL HERNIA REPAIR WITH MESH;  Surgeon: Coralie Keens, MD;  Location: [facility];  Service: General;  Laterality: N/A;   Family History  Problem Relation Age of  Onset  . Cancer Father     unknown  . Hypertension Mother   . Diverticulitis Mother    History  Substance Use Topics  . Smoking status: Current Every Day Smoker -- 0.20 packs/day for 17 years    Types: Cigarettes  . Smokeless tobacco: Never Used     Comment: wants to quit  . Alcohol Use: 0.0 oz/week    0 Not specified per week     Comment: Occasionally   OB History    No data available     Review of Systems  Constitutional: Positive for fever.  HENT: Negative for congestion.   Eyes: Negative for visual disturbance.  Respiratory: Negative for shortness of breath.   Cardiovascular: Negative for chest pain.  Gastrointestinal: Negative for nausea and vomiting.  Genitourinary: Negative for dysuria.  Musculoskeletal: Negative for back pain.  Skin: Positive for wound.  Neurological: Negative for headaches.  Hematological: Does not bruise/bleed easily.  Psychiatric/Behavioral: Negative for confusion.      Allergies  Ciprofloxacin; Morphine and related; Penicillins; Codeine; and Feraheme  Home Medications   Prior to Admission medications   Medication Sig Start Date End Date Taking? Authorizing Provider  aspirin EC 81 MG tablet Take 81 mg by mouth daily.    Yes Historical Provider, MD  atenolol (TENORMIN) 50 MG tablet Take 50 mg by mouth every morning.    Yes Historical Provider, MD  cetirizine (ZYRTEC) 10 MG tablet Take 10 mg by mouth every morning.    Yes Historical Provider, MD  cyclobenzaprine (FLEXERIL) 10 MG tablet Take 1 tablet (10 mg total) by mouth 2 (two) times daily as needed for muscle spasms. 11/18/13  Yes Blanchie Dessert, MD  esomeprazole (NEXIUM) 40 MG capsule Take 40 mg by mouth 2 (two) times daily.   Yes Historical Provider, MD  fluorometholone (FML) 0.1 % ophthalmic suspension Place 1 drop into the left eye 2 (two) times daily.   Yes Historical Provider, MD  FLUoxetine (PROZAC) 40 MG capsule TAKE 1 CAPSULE BY MOUTH DAILY 03/10/14  Yes [name], MD   gabapentin (NEURONTIN) 400 MG capsule Take 800 mg by mouth 3 (three) times daily.   Yes Historical Provider, MD  hydrochlorothiazide (HYDRODIURIL) 25 MG tablet Take 25 mg by mouth every morning.   Yes Historical Provider, MD  lisinopril (PRINIVIL,ZESTRIL) 40 MG tablet Take 1 tablet (40 mg total) by mouth every morning.   Yes [name], MD  metFORMIN (GLUCOPHAGE) 500 MG tablet Take 500 mg by mouth daily with breakfast.   Yes Historical Provider, MD  oxyCODONE-acetaminophen (PERCOCET) 10-325 MG per tablet Take 1 tablet by mouth every 6 (six) hours as needed for pain. 05/14/14  Yes [name], MD  potassium chloride 20 MEQ/15ML (10%) SOLN TAKE 15ML BY MOUTH EVERY DAY 03/19/14  Yes [name], MD  prednisoLONE acetate (PRED FORTE) 1 % ophthalmic suspension Place 1 drop into the left eye 3 (three) times daily.   Yes Historical Provider, MD  Specialty Vitamins Products (MAGNESIUM, AMINO ACID CHELATE,) 133 MG  tablet Take 1 tablet by mouth 2 (two) times daily.   Yes Historical Provider, MD  traMADol (ULTRAM) 50 MG tablet Take 1-2 tablets (50-100 mg total) by mouth every 6 (six) hours as needed. Patient taking differently: Take 50-100 mg by mouth every 6 (six) hours as needed.  01/19/14  Yes Coralie Keens, MD  traZODone (DESYREL) 100 MG tablet Take 100 mg by mouth at bedtime.   Yes Historical Provider, MD  vitamin C (ASCORBIC ACID) 500 MG tablet Take 500 mg by mouth every morning.   Yes Historical Provider, MD  ACCU-CHEK FASTCLIX LANCETS MISC Check your blood sugar one time a day 04/08/14   [name], MD  ALREX 0.2 % SUSP  12/22/13   Historical Provider, MD  glucose blood (ACCU-CHEK SMARTVIEW) test strip Check blood sugar  one time a day 05/14/14   [name], MD  lactose free nutrition (BOOST PLUS) LIQD Take 237 mLs by mouth 3 (three) times daily with meals. Patient not taking: Reported on 05/19/2014 10/16/13   [name], MD  oxyCODONE-acetaminophen (PERCOCET/ROXICET) 5-325 MG per tablet Take 1-2  tablets by mouth every 4 (four) hours as needed for moderate pain or severe pain. Patient not taking: Reported on 06/17/2014 06/08/14   Coralie Keens, MD   BP 115/57 mmHg  Pulse 77  Temp(Src) 98.8 F (37.1 C) (Oral)  Resp 18  SpO2 97% Physical Exam  Constitutional: She is oriented to person, place, and time. She appears well-developed and well-nourished. No distress.  HENT:  Head: Normocephalic and atraumatic.  Mouth/Throat: Oropharynx is clear and moist.  Eyes: Conjunctivae and EOM are normal. Pupils are equal, round, and reactive to light.  Neck: Normal range of motion.  Cardiovascular: Normal rate, regular rhythm and normal heart sounds.   No murmur heard. Pulmonary/Chest: Effort normal and breath sounds normal.  Abdominal: Soft. Bowel sounds are normal. There is no tenderness.  Musculoskeletal: Normal range of motion.  Neurological: She is alert and oriented to person, place, and time. No cranial nerve deficit. She exhibits normal muscle tone. Coordination normal.  Skin: Skin is warm.  Nursing note and vitals reviewed.   ED Course  Procedures (including critical care time) Labs Review Labs Reviewed  CBC WITH DIFFERENTIAL - Abnormal; Notable for the following:    RBC 3.60 (*)    Hemoglobin 10.2 (*)    HCT 31.8 (*)    Platelets 499 (*)    Monocytes Relative 16 (*)    Monocytes Absolute 1.6 (*)    All other components within normal limits  BASIC METABOLIC PANEL - Abnormal; Notable for the following:    Sodium 134 (*)    Potassium 3.1 (*)    Glucose, Bld 144 (*)    Calcium 7.6 (*)    GFR calc non Af Amer 71 (*)    GFR calc Af Amer 83 (*)    All other components within normal limits   Results for orders placed or performed during the hospital encounter of 06/17/14  CBC with Differential  Result Value Ref Range   WBC 9.5 4.0 - 10.5 K/uL   RBC 3.60 (L) 3.87 - 5.11 MIL/uL   Hemoglobin 10.2 (L) 12.0 - 15.0 g/dL   HCT 31.8 (L) 36.0 - 46.0 %   MCV 88.3 78.0 - 100.0 fL    MCH 28.3 26.0 - 34.0 pg   MCHC 32.1 30.0 - 36.0 g/dL   RDW 15.4 11.5 - 15.5 %   Platelets 499 (H) 150 - 400 K/uL  Neutrophils Relative % 63 43 - 77 %   Neutro Abs 5.9 1.7 - 7.7 K/uL   Lymphocytes Relative 20 12 - 46 %   Lymphs Abs 1.9 0.7 - 4.0 K/uL   Monocytes Relative 16 (H) 3 - 12 %   Monocytes Absolute 1.6 (H) 0.1 - 1.0 K/uL   Eosinophils Relative 1 0 - 5 %   Eosinophils Absolute 0.1 0.0 - 0.7 K/uL   Basophils Relative 0 0 - 1 %   Basophils Absolute 0.0 0.0 - 0.1 K/uL  Basic metabolic panel  Result Value Ref Range   Sodium 134 (L) 135 - 145 mmol/L   Potassium 3.1 (L) 3.5 - 5.1 mmol/L   Chloride 98 96 - 112 mEq/L   CO2 26 19 - 32 mmol/L   Glucose, Bld 144 (H) 70 - 99 mg/dL   BUN 11 6 - 23 mg/dL   Creatinine, Ser 0.86 0.50 - 1.10 mg/dL   Calcium 7.6 (L) 8.4 - 10.5 mg/dL   GFR calc non Af Amer 71 (L) >90 mL/min   GFR calc Af Amer 83 (L) >90 mL/min   Anion gap 10 5 - 15     Imaging Review No results found.   EKG Interpretation None      MDM   Final diagnoses:  None    Patient status post the antral hernia repair with mesh by [facility] [place] [facility] on January 5. Patient seen in the office on Tuesday staples removed and some of the skin incision started to open up the patient was receiving wet-to-dry dressing changes at home. Home nurse noted a worsening of that wound and patient was referred in. On examination here. It appears that there has been a breakdown in the abdominal wall fascia and there is most likely a dehiscence. Interesting though no mesh present. Discussed with general surgery they will see and dispositional be based on their recommendation.  Patient without fever here patient talks about subjective fevers at home. Patient has to grenade drain bulbs of both draining serosanguineous type material no obvious pus. Patient's white count without significant elevation patient's labs without significant abnormalities other than a mild hyponatremia and  hypokalemia. Renal function normal.    Fredia Sorrow, MD 06/17/14 2037

## 2014-06-17 NOTE — H&P (Addendum)
[name] is an 64 y.o. female.   Chief Complaint: Postoperative wound problem HPI: [name] is status post laparoscopic converted to open incisional hernia repair with onlay mesh by Dr. Nedra Hai on 06/01/2014. Her staples were removed in the office a few days ago. The lower portion of her wound was noted to dehis over the past 2 days by her home health nurse. She was directed to the emergency department. Her appetite has returned and she is moving her bowels. She denies fevers. Pain control has been adequate on orals.  Past Medical History  Diagnosis Date  . Hypertension   . Colon polyps   . Stomach problems   . Hypokalemic alkalosis   . Allergic rhinitis   . GERD (gastroesophageal reflux disease)   . Stroke 2010    no residual problems  . Anemia, iron deficiency 05/03/2011    recieves iron infusions  . Anemia   . GI bleeding   . Diverticulitis   . Transfusion history   . Difficulty sleeping     takes trazadone for sleep  . Peripheral vascular disease   . Hepatitis     hep C  . Anxiety   . Shortness of breath     due to anemia    Past Surgical History  Procedure Laterality Date  . Leg amputation above knee    . Esophagogastroduodenoscopy  05/05/2011    Procedure: ESOPHAGOGASTRODUODENOSCOPY (EGD);  Surgeon: Zenovia Jarred, MD;  Location: [facility];  Service: Gastroenterology;  Laterality: N/A;  Dr. Hilarie Fredrickson will do procedure for Dr. Benson Norway Saturday.  . Esophagogastroduodenoscopy  05/08/2011    Procedure: ESOPHAGOGASTRODUODENOSCOPY (EGD);  Surgeon: Beryle Beams;  Location: [facility];  Service: Endoscopy;  Laterality: N/A;  . Esophagogastroduodenoscopy  06/07/2011    Procedure: ESOPHAGOGASTRODUODENOSCOPY (EGD);  Surgeon: Beryle Beams, MD;  Location: [facility];  Service: Endoscopy;  Laterality: N/A;  . Hot hemostasis  06/07/2011    Procedure: HOT HEMOSTASIS (ARGON PLASMA COAGULATION/BICAP);  Surgeon: Beryle Beams, MD;  Location: [facility];  Service: Endoscopy;   Laterality: N/A;  . Esophagogastroduodenoscopy  12/20/2011    Procedure: ESOPHAGOGASTRODUODENOSCOPY (EGD);  Surgeon: Beryle Beams, MD;  Location: [facility];  Service: Endoscopy;  Laterality: N/A;  . Flexible sigmoidoscopy  12/21/2011    Procedure: FLEXIBLE SIGMOIDOSCOPY;  Surgeon: Beryle Beams, MD;  Location: [facility];  Service: Endoscopy;  Laterality: N/A;  . Carpal tunnel release      rt hand  . Partial colectomy  02/15/2012    Procedure: PARTIAL COLECTOMY;  Surgeon: Harl Bowie, MD;  Location: [facility];  Service: General;  Laterality: N/A;  . Laparotomy  02/16/2012    Procedure: EXPLORATORY LAPAROTOMY;  Surgeon: Edward Jolly, MD;  Location: [facility];  Service: General;  Laterality: N/A;  oversewing of anastomotic leak and rigid sigmoidoscopy  . Enteroscopy N/A 12/04/2012    Procedure: ENTEROSCOPY;  Surgeon: Beryle Beams, MD;  Location: [facility];  Service: Endoscopy;  Laterality: N/A;  . Enteroscopy N/A 12/18/2012    Procedure: ENTEROSCOPY;  Surgeon: Beryle Beams, MD;  Location: [facility];  Service: Endoscopy;  Laterality: N/A;  . Eye surgery Bilateral     cataracts  . Tonsillectomy    . Abdominal hysterectomy    . Insertion of mesh N/A 06/01/2014    Procedure: INSERTION OF MESH;  Surgeon: Coralie Keens, MD;  Location: [facility];  Service: General;  Laterality: N/A;  . Incisional hernia repair N/A 06/01/2014    Procedure: ATTEMPTED LAPAROSCOPIC AND  OPEN INCISIONAL HERNIA REPAIR WITH MESH;  Surgeon: Coralie Keens, MD;  Location: [facility] OR;  Service: General;  Laterality: N/A;    Family History  Problem Relation Age of Onset  . Cancer Father     unknown  . Hypertension Mother   . Diverticulitis Mother    Social History:  reports that she has been smoking Cigarettes.  She has a 3.4 pack-year smoking history. She has never used smokeless tobacco. She reports that she drinks alcohol. She reports that she uses illicit drugs (Marijuana and "Crack"  cocaine).  Allergies:  Allergies  Allergen Reactions  . Ciprofloxacin Hives and Swelling  . Morphine And Related Other (See Comments)    Makes pt sad and paranoid  . Penicillins Hives and Swelling  . Codeine Hives and Swelling  . Feraheme [Ferumoxytol] Itching    Tolerated Feraheme 6/26 & 7/21 pre-medications prior to medication     (Not in a hospital admission)  Results for orders placed or performed during the hospital encounter of 06/17/14 (from the past 48 hour(s))  CBC with Differential     Status: Abnormal   Collection Time: 06/17/14  5:13 PM  Result Value Ref Range   WBC 9.5 4.0 - 10.5 K/uL   RBC 3.60 (L) 3.87 - 5.11 MIL/uL   Hemoglobin 10.2 (L) 12.0 - 15.0 g/dL   HCT 31.8 (L) 36.0 - 46.0 %   MCV 88.3 78.0 - 100.0 fL   MCH 28.3 26.0 - 34.0 pg   MCHC 32.1 30.0 - 36.0 g/dL   RDW 15.4 11.5 - 15.5 %   Platelets 499 (H) 150 - 400 K/uL   Neutrophils Relative % 63 43 - 77 %   Neutro Abs 5.9 1.7 - 7.7 K/uL   Lymphocytes Relative 20 12 - 46 %   Lymphs Abs 1.9 0.7 - 4.0 K/uL   Monocytes Relative 16 (H) 3 - 12 %   Monocytes Absolute 1.6 (H) 0.1 - 1.0 K/uL   Eosinophils Relative 1 0 - 5 %   Eosinophils Absolute 0.1 0.0 - 0.7 K/uL   Basophils Relative 0 0 - 1 %   Basophils Absolute 0.0 0.0 - 0.1 K/uL  Basic metabolic panel     Status: Abnormal   Collection Time: 06/17/14  5:13 PM  Result Value Ref Range   Sodium 134 (L) 135 - 145 mmol/L    Comment: Please note change in reference range.   Potassium 3.1 (L) 3.5 - 5.1 mmol/L    Comment: Please note change in reference range.   Chloride 98 96 - 112 mEq/L   CO2 26 19 - 32 mmol/L   Glucose, Bld 144 (H) 70 - 99 mg/dL   BUN 11 6 - 23 mg/dL   Creatinine, Ser 0.86 0.50 - 1.10 mg/dL   Calcium 7.6 (L) 8.4 - 10.5 mg/dL   GFR calc non Af Amer 71 (L) >90 mL/min   GFR calc Af Amer 83 (L) >90 mL/min    Comment: (NOTE) The eGFR has been calculated using the CKD EPI equation. This calculation has not been validated in all clinical  situations. eGFR's persistently <90 mL/min signify possible Chronic Kidney Disease.    Anion gap 10 5 - 15   No results found.  Review of Systems  Constitutional: Negative for fever and chills.  HENT: Negative.   Eyes: Negative.   Respiratory: Negative.   Cardiovascular: Negative.   Gastrointestinal: Negative for nausea and vomiting.       No change in postoperative pain  Genitourinary: Negative.   Musculoskeletal: Negative.   Skin:       See history of present illness  Neurological: Negative.   Endo/Heme/Allergies: Negative.   Psychiatric/Behavioral: Negative.     Blood pressure 115/57, pulse 77, temperature 98.8 F (37.1 C), temperature source Oral, resp. rate 18, SpO2 97 %. Physical Exam  Constitutional: She is oriented to person, place, and time. She appears well-developed and well-nourished. No distress.  HENT:  Head: Normocephalic.  Neck: Neck supple.  Cardiovascular: Normal rate and normal heart sounds.   Respiratory: Effort normal and breath sounds normal. No respiratory distress. She has no wheezes.  GI: Soft. She exhibits no distension. There is no tenderness. There is no rebound and no guarding.  Lower third of wound has dehisced superficially, there also appears to be a localized fascial defect and some necrotic eschar along the skin edge, no frank purulence, no dehiscence of abdominal contents. See photo. New wet to dry dressing was placed and binder was reapplied.  Neurological: She is alert and oriented to person, place, and time.  Skin: Skin is warm.  Psychiatric: She has a normal mood and affect.     Assessment/Plan Status post laparoscopic converted to open incisional hernia repair with onlay mesh - now with wound infection, mild cellulitis, partial wound dehiscence. Admit for wound care and IV antibiotics. I will notify Dr. Ninfa Linden.  Tannon Peerson E 06/17/2014, 8:42 PM

## 2014-06-18 LAB — GLUCOSE, CAPILLARY
GLUCOSE-CAPILLARY: 132 mg/dL — AB (ref 70–99)
Glucose-Capillary: 131 mg/dL — ABNORMAL HIGH (ref 70–99)
Glucose-Capillary: 120 mg/dL — ABNORMAL HIGH (ref 70–99)
Glucose-Capillary: 103 mg/dL — ABNORMAL HIGH (ref 70–99)
Glucose-Capillary: 121 mg/dL — ABNORMAL HIGH (ref 70–99)

## 2014-06-18 LAB — BASIC METABOLIC PANEL
ANION GAP: 11 (ref 5–15)
BUN: 11 mg/dL (ref 6–23)
CHLORIDE: 95 meq/L — AB (ref 96–112)
CO2: 30 mmol/L (ref 19–32)
CREATININE: 0.81 mg/dL (ref 0.50–1.10)
Calcium: 7.6 mg/dL — ABNORMAL LOW (ref 8.4–10.5)
GFR calc non Af Amer: 77 mL/min — ABNORMAL LOW (ref >90)
GFR, EST AFRICAN AMERICAN: 89 mL/min — AB (ref >90)
Glucose, Bld: 146 mg/dL — ABNORMAL HIGH (ref 70–99)
Potassium: 3.5 mmol/L (ref 3.5–5.1)
Sodium: 136 mmol/L (ref 135–145)

## 2014-06-18 LAB — CBC
HCT: 31 % — ABNORMAL LOW (ref 36.0–46.0)
Hemoglobin: 9.8 g/dL — ABNORMAL LOW (ref 12.0–15.0)
MCH: 28.1 pg (ref 26.0–34.0)
MCHC: 31.6 g/dL (ref 30.0–36.0)
MCV: 88.8 fL (ref 78.0–100.0)
PLATELETS: 474 10*3/uL — AB (ref 150–400)
RBC: 3.49 MIL/uL — ABNORMAL LOW (ref 3.87–5.11)
RDW: 15.7 % — AB (ref 11.5–15.5)
WBC: 7.7 10*3/uL (ref 4.0–10.5)

## 2014-06-18 MED ORDER — FLUOROMETHOLONE 0.1 % OP SUSP
1.0000 [drp] | Freq: Two times a day (BID) | OPHTHALMIC | Status: DC
  Filled 2014-06-18: qty 5

## 2014-06-18 MED ORDER — FLUOROMETHOLONE 0.1 % OP SUSP
1.0000 [drp] | Freq: Two times a day (BID) | OPHTHALMIC | Status: DC
  Administered 2014-06-19: 1 [drp] via OPHTHALMIC
  Filled 2014-06-18: qty 5

## 2014-06-18 MED ORDER — PREDNISOLONE ACETATE 1 % OP SUSP
1.0000 [drp] | Freq: Every day | OPHTHALMIC | Status: DC
  Administered 2014-06-19 – 2014-06-28 (×8): 1 [drp] via OPHTHALMIC

## 2014-06-18 MED ORDER — WHITE PETROLATUM GEL
Status: AC
  Filled 2014-06-18: qty 1

## 2014-06-18 NOTE — Progress Notes (Signed)
  Subjective: PT ADMITTED LAST NIGHT DUE TO WOUND INFECTION /OPEN INCISION AFTER OPEN INCISIONAL HERNIA REPAIR BY DR Ninfa Linden 2 WEEKS AGO NO COMPLAINTS THIS AM  Objective: Vital signs in last 24 hours: Temp:  [98.5 F (36.9 C)-99.6 F (37.6 C)] 99.6 F (37.6 C) (01/22 0531) Pulse Rate:  [76-82] 76 (01/22 0531) Resp:  [16-22] 18 (01/22 0531) BP: (101-138)/(53-91) 110/60 mmHg (01/22 0531) SpO2:  [96 %-100 %] 100 % (01/22 0531) Weight:  [187 lb (84.823 kg)] 187 lb (84.823 kg) (01/21 2125) Last BM Date: 06/17/14  Intake/Output from previous day: 01/21 0701 - 01/22 0700 In: -  Out: 545 [Urine:500; Drains:45] Intake/Output this shift:    Incision/Wound:OPEN WITH MOSTLY GRANULATION TISSUE AND CAVITY NOTED NO EVIDENCE OF FASCIAL BREAKDOWN  NO SIGNIFICANT DRAINAGE  Lab Results:   Recent Labs  06/17/14 1713 06/18/14 0624  WBC 9.5 7.7  HGB 10.2* 9.8*  HCT 31.8* 31.0*  PLT 499* 474*   BMET  Recent Labs  06/17/14 1713 06/18/14 0624  NA 134* 136  K 3.1* 3.5  CL 98 95*  CO2 26 30  GLUCOSE 144* 146*  BUN 11 11  CREATININE 0.86 0.81  CALCIUM 7.6* 7.6*   PT/INR No results for input(s): LABPROT, INR in the last 72 hours. ABG No results for input(s): PHART, HCO3 in the last 72 hours.  Invalid input(s): PCO2, PO2  Studies/Results: No results found.  Anti-infectives: Anti-infectives    Start     Dose/Rate Route Frequency Ordered Stop   06/17/14 2215  clindamycin (CLEOCIN) IVPB 600 mg     600 mg100 mL/hr over 30 Minutes Intravenous 3 times per day 06/17/14 2155        Assessment/Plan: S/P INCISIONAL HERNIA REPAIR BY DR Ninfa Linden WITH OPEN WOUND NO CHANGE TO WOUND THIS AM CONT LOCAL WOUND CARE AND ABX OOB WILL NEED HHN UPON DISCHARGE   LOS: 1 day    Patricia Bach A. 06/18/2014

## 2014-06-18 NOTE — Progress Notes (Signed)
Discussed [name] x 2 that will not recharge, 2 Staples on each side which are bothering patient and eye drops being ordered wrong with Dr. Redmond Pulling. Told to remove the staples and contact the pharmacy about the eye drops. Staples were removed and 1 steri strip applied to each side. Melford Aase, RN

## 2014-06-18 NOTE — Progress Notes (Signed)
[facility]  Patient Status: Active (receiving services up to time of hospitalization)  [facility] is providing the following services: RN and PT  If patient discharges after hours, please call [phone].   [name] 06/18/2014, 10:15 AM

## 2014-06-18 NOTE — Progress Notes (Signed)
Patient ID: [name], female   DOB: 1950/11/09, 64 y.o.   MRN: [id number]   Abdominal wound. Georganna Skeans, MD, MPH, FACS Trauma: [phone] General Surgery: [phone]

## 2014-06-19 LAB — GLUCOSE, CAPILLARY
Glucose-Capillary: 117 mg/dL — ABNORMAL HIGH (ref 70–99)
Glucose-Capillary: 128 mg/dL — ABNORMAL HIGH (ref 70–99)
Glucose-Capillary: 152 mg/dL — ABNORMAL HIGH (ref 70–99)
Glucose-Capillary: 105 mg/dL — ABNORMAL HIGH (ref 70–99)

## 2014-06-19 LAB — COMPREHENSIVE METABOLIC PANEL
ALT: 29 U/L (ref 0–35)
AST: 35 U/L (ref 0–37)
Albumin: 2.5 g/dL — ABNORMAL LOW (ref 3.5–5.2)
Alkaline Phosphatase: 57 U/L (ref 39–117)
Anion gap: 12 (ref 5–15)
BUN: 14 mg/dL (ref 6–23)
CHLORIDE: 95 mmol/L — AB (ref 96–112)
CO2: 26 mmol/L (ref 19–32)
CREATININE: 0.98 mg/dL (ref 0.50–1.10)
Calcium: 7.6 mg/dL — ABNORMAL LOW (ref 8.4–10.5)
GFR, EST AFRICAN AMERICAN: 71 mL/min — AB (ref >90)
GFR, EST NON AFRICAN AMERICAN: 61 mL/min — AB (ref >90)
GLUCOSE: 122 mg/dL — AB (ref 70–99)
Potassium: 3.3 mmol/L — ABNORMAL LOW (ref 3.5–5.1)
Sodium: 133 mmol/L — ABNORMAL LOW (ref 135–145)
Total Bilirubin: 0.5 mg/dL (ref 0.3–1.2)
Total Protein: 6.1 g/dL (ref 6.0–8.3)

## 2014-06-19 LAB — CBC
HCT: 30.3 % — ABNORMAL LOW (ref 36.0–46.0)
Hemoglobin: 9.7 g/dL — ABNORMAL LOW (ref 12.0–15.0)
MCH: 28.3 pg (ref 26.0–34.0)
MCHC: 32 g/dL (ref 30.0–36.0)
MCV: 88.3 fL (ref 78.0–100.0)
PLATELETS: 455 10*3/uL — AB (ref 150–400)
RBC: 3.43 MIL/uL — AB (ref 3.87–5.11)
RDW: 15.8 % — ABNORMAL HIGH (ref 11.5–15.5)
WBC: 8.4 10*3/uL (ref 4.0–10.5)

## 2014-06-19 MED ORDER — FLUOROMETHOLONE 0.1 % OP SUSP
1.0000 [drp] | Freq: Two times a day (BID) | OPHTHALMIC | Status: DC
  Administered 2014-06-19 – 2014-06-28 (×16): 1 [drp] via OPHTHALMIC
  Filled 2014-06-19: qty 5

## 2014-06-19 MED ORDER — PANTOPRAZOLE SODIUM 40 MG PO TBEC: 40.0000 mg | DELAYED_RELEASE_TABLET | Freq: Every day | ORAL | Status: DC

## 2014-06-19 MED ORDER — PANTOPRAZOLE SODIUM 40 MG PO TBEC
40.0000 mg | DELAYED_RELEASE_TABLET | Freq: Every day | ORAL | Status: DC
  Administered 2014-06-19 – 2014-06-28 (×10): 40 mg via ORAL
  Filled 2014-06-19 (×7): qty 1

## 2014-06-19 MED ORDER — FLUOROMETHOLONE 0.1 % OP SUSP: 1.0000 [drp] | Freq: Two times a day (BID) | OPHTHALMIC | Status: DC

## 2014-06-19 NOTE — Progress Notes (Signed)
Patient ID: [name], female   DOB: 03/04/1951, 64 y.o.   MRN: [id number]  General Surgery - [facility] - Progress Note  HD# 2  Subjective: Patient up in chair watching TV.  No complaints.  Suction bulbs changed - now holding suction.  Dressing not changed yet this AM.  Objective: Vital signs in last 24 hours: Temp:  [98.7 F (37.1 C)-99.3 F (37.4 C)] 98.7 F (37.1 C) (01/23 0505) Pulse Rate:  [74-87] 81 (01/23 0505) Resp:  [17-18] 18 (01/23 0505) BP: (112-125)/(64-67) 112/67 mmHg (01/23 0505) SpO2:  [93 %-100 %] 93 % (01/23 0505) Last BM Date: 06/18/14  Intake/Output from previous day: 01/22 0701 - 01/23 0700 In: 480 [P.O.:480] Out: 23 [Drains:23]  Exam: HEENT - clear, not icteric Neck - soft Chest - clear bilaterally Cor - RRR, no murmur Abd - soft, dressing dry and intact; JP's holding suction, serous fluid in each  Lab Results:   Recent Labs  06/18/14 0624 06/19/14 0050  WBC 7.7 8.4  HGB 9.8* 9.7*  HCT 31.0* 30.3*  PLT 474* 455*     Recent Labs  06/18/14 0624 06/19/14 0050  NA 136 133*  K 3.5 3.3*  CL 95* 95*  CO2 30 26  GLUCOSE 146* 122*  BUN 11 14  CREATININE 0.81 0.98  CALCIUM 7.6* 7.6*    Studies/Results: No results found.  Assessment / Plan: 1.  Open VHR with mesh - wound infection  IV clindamycin  Open dressing changes  [facility]  Will return to check wound at time of dressing change  Earnstine Regal, MD, [facility] Office: [phone]  06/19/2014

## 2014-06-19 NOTE — Plan of Care (Signed)
Problem: Phase I Progression Outcomes Goal: OOB as tolerated unless otherwise ordered Outcome: Progressing In chair x 2 today. Tolerated well.

## 2014-06-19 NOTE — Progress Notes (Signed)
General Surgery [facility]  Dressing to midline wound changed.  Large cavity present anterior to mesh and below subcutaneous tissue.  Midline wound opened further cephalad, but painful for patient.  Drains removed as would not hold suction.  Wound packed with Kerlix gauze wet to dry with NS.  Likely will require operative debridement and possibly VAC placement on Monday 1/25.  Earnstine Regal, MD, [facility] Office: [phone]

## 2014-06-20 LAB — WOUND CULTURE
Gram Stain: NONE SEEN
Special Requests: NORMAL

## 2014-06-20 LAB — GLUCOSE, CAPILLARY
GLUCOSE-CAPILLARY: 111 mg/dL — AB (ref 70–99)
GLUCOSE-CAPILLARY: 108 mg/dL — AB (ref 70–99)
Glucose-Capillary: 145 mg/dL — ABNORMAL HIGH (ref 70–99)
Glucose-Capillary: 91 mg/dL (ref 70–99)

## 2014-06-20 MED ORDER — SODIUM CHLORIDE 0.9 % IV SOLN
INTRAVENOUS | Status: DC
  Administered 2014-06-20: via INTRAVENOUS

## 2014-06-20 MED ORDER — FAMOTIDINE 10 MG PO TABS
10.0000 mg | ORAL_TABLET | Freq: Once | ORAL | Status: AC
  Administered 2014-06-20: 10 mg via ORAL
  Filled 2014-06-20: qty 1

## 2014-06-20 NOTE — Progress Notes (Signed)
Patient ID: [name], female   DOB: 1950-06-25, 64 y.o.   MRN: [id number]  General Surgery - [facility] - Progress Note  HD# 4  Subjective: Patient comfortable in bed.  No complaints.  Dressing changes ongoing.  Objective: Vital signs in last 24 hours: Temp:  [98.8 F (37.1 C)-101.9 F (38.8 C)] 98.8 F (37.1 C) (01/24 0552) Pulse Rate:  [78-89] 84 (01/24 0552) Resp:  [18-19] 18 (01/24 0552) BP: (97-129)/(52-70) 97/52 mmHg (01/24 0552) SpO2:  [94 %-96 %] 94 % (01/24 0552) Last BM Date: 06/19/14  Intake/Output from previous day:    Exam: HEENT - clear, not icteric Neck - soft Chest - clear bilaterally Cor - RRR, no murmur Abd - soft, dressing intact; binder on Ext - no significant edema Neuro - grossly intact, no focal deficits  Lab Results:   Recent Labs  06/18/14 0624 06/19/14 0050  WBC 7.7 8.4  HGB 9.8* 9.7*  HCT 31.0* 30.3*  PLT 474* 455*     Recent Labs  06/18/14 0624 06/19/14 0050  NA 136 133*  K 3.5 3.3*  CL 95* 95*  CO2 30 26  GLUCOSE 146* 122*  BUN 11 14  CREATININE 0.81 0.98  CALCIUM 7.6* 7.6*    Studies/Results: No results found.  Assessment / Plan: 1.  Open VHR with mesh  Wound open and mesh exposed  Drains out, open dressing changes  Likely will need operative exploration, further opening of wound, and VAC placement  Will make NPO after MN in anticipation of OR by Dr. Ninfa Linden tomorrow  On IV clindamycin  Earnstine Regal, MD, [facility] Office: [phone]  06/20/2014

## 2014-06-21 ENCOUNTER — Encounter (HOSPITAL_COMMUNITY)
Admission: EM | Disposition: A | Discharge status: Home Health | Payer: Self-pay | Source: Home / Self Care | Attending: Surgery

## 2014-06-21 ENCOUNTER — Inpatient Hospital Stay (HOSPITAL_COMMUNITY): Payer: Medicare Other | Admitting: Certified Registered Nurse Anesthetist

## 2014-06-21 ENCOUNTER — Encounter (HOSPITAL_COMMUNITY): Payer: Self-pay | Admitting: Anesthesiology

## 2014-06-21 HISTORY — PX: APPLICATION OF WOUND VAC: SHX5189

## 2014-06-21 HISTORY — PX: LAPAROTOMY: SHX154

## 2014-06-21 LAB — GLUCOSE, CAPILLARY
GLUCOSE-CAPILLARY: 96 mg/dL (ref 70–99)
Glucose-Capillary: 127 mg/dL — ABNORMAL HIGH (ref 70–99)
Glucose-Capillary: 112 mg/dL — ABNORMAL HIGH (ref 70–99)
Glucose-Capillary: 128 mg/dL — ABNORMAL HIGH (ref 70–99)

## 2014-06-21 SURGERY — LAPAROTOMY, EXPLORATORY
Anesthesia: General | Site: Abdomen

## 2014-06-21 MED ORDER — LIDOCAINE HCL (CARDIAC) 20 MG/ML IV SOLN
INTRAVENOUS | Status: AC
  Filled 2014-06-21: qty 5

## 2014-06-21 MED ORDER — LIDOCAINE HCL (CARDIAC) 20 MG/ML IV SOLN
INTRAVENOUS | Status: DC | PRN
  Administered 2014-06-21: 50 mg via INTRAVENOUS

## 2014-06-21 MED ORDER — LACTATED RINGERS IV SOLN
INTRAVENOUS | Status: DC
  Administered 2014-06-21: 13:00:00 via INTRAVENOUS

## 2014-06-21 MED ORDER — FENTANYL CITRATE 0.05 MG/ML IJ SOLN
INTRAMUSCULAR | Status: DC | PRN
  Administered 2014-06-21: 100 ug via INTRAVENOUS
  Administered 2014-06-21: 50 ug via INTRAVENOUS

## 2014-06-21 MED ORDER — FENTANYL CITRATE 0.05 MG/ML IJ SOLN
25.0000 ug | INTRAMUSCULAR | Status: DC | PRN
  Administered 2014-06-21: 25 ug via INTRAVENOUS

## 2014-06-21 MED ORDER — LACTATED RINGERS IV SOLN
INTRAVENOUS | Status: DC | PRN
  Administered 2014-06-21: 14:00:00 via INTRAVENOUS

## 2014-06-21 MED ORDER — MIDAZOLAM HCL 2 MG/2ML IJ SOLN
INTRAMUSCULAR | Status: AC
  Filled 2014-06-21: qty 2

## 2014-06-21 MED ORDER — PROPOFOL 10 MG/ML IV BOLUS
INTRAVENOUS | Status: AC
  Filled 2014-06-21: qty 20

## 2014-06-21 MED ORDER — PROMETHAZINE HCL 25 MG/ML IJ SOLN: 6.2500 mg | INTRAMUSCULAR | Status: DC | PRN

## 2014-06-21 MED ORDER — SODIUM CHLORIDE 0.9 % IV SOLN
INTRAVENOUS | Status: DC
  Administered 2014-06-21 – 2014-06-24 (×4): via INTRAVENOUS

## 2014-06-21 MED ORDER — ROCURONIUM BROMIDE 50 MG/5ML IV SOLN
INTRAVENOUS | Status: AC
  Filled 2014-06-21: qty 1

## 2014-06-21 MED ORDER — FENTANYL CITRATE 0.05 MG/ML IJ SOLN
INTRAMUSCULAR | Status: AC
  Filled 2014-06-21: qty 5

## 2014-06-21 MED ORDER — 0.9 % SODIUM CHLORIDE (POUR BTL) OPTIME
TOPICAL | Status: DC | PRN
  Administered 2014-06-21: 2000 mL
  Administered 2014-06-21: 1000 mL

## 2014-06-21 MED ORDER — ONDANSETRON HCL 4 MG/2ML IJ SOLN
INTRAMUSCULAR | Status: AC
  Filled 2014-06-21: qty 2

## 2014-06-21 MED ORDER — SUCCINYLCHOLINE CHLORIDE 20 MG/ML IJ SOLN
INTRAMUSCULAR | Status: DC | PRN
  Administered 2014-06-21: 100 mg via INTRAVENOUS

## 2014-06-21 MED ORDER — FENTANYL CITRATE 0.05 MG/ML IJ SOLN
INTRAMUSCULAR | Status: AC
  Filled 2014-06-21: qty 2

## 2014-06-21 MED ORDER — PHENYLEPHRINE HCL 10 MG/ML IJ SOLN
INTRAMUSCULAR | Status: DC | PRN
  Administered 2014-06-21 (×5): 80 ug via INTRAVENOUS

## 2014-06-21 MED ORDER — CLINDAMYCIN PHOSPHATE 600 MG/50ML IV SOLN
600.0000 mg | Freq: Once | INTRAVENOUS | Status: AC
  Filled 2014-06-21: qty 50

## 2014-06-21 MED ORDER — SUCCINYLCHOLINE CHLORIDE 20 MG/ML IJ SOLN
INTRAMUSCULAR | Status: AC
  Filled 2014-06-21: qty 1

## 2014-06-21 MED ORDER — PROPOFOL 10 MG/ML IV BOLUS
INTRAVENOUS | Status: DC | PRN
Start: 2014-06-21 — End: 2014-06-21
  Administered 2014-06-21: 130 mg via INTRAVENOUS

## 2014-06-21 SURGICAL SUPPLY — 51 items
BLADE SURG ROTATE 9660 (MISCELLANEOUS) IMPLANT
CANISTER SUCTION 2500CC (MISCELLANEOUS) ×2 IMPLANT
CANISTER WOUND CARE 500ML ATS (WOUND CARE) ×2 IMPLANT
COVER MAYO STAND STRL (DRAPES) IMPLANT
COVER SURGICAL LIGHT HANDLE (MISCELLANEOUS) ×2 IMPLANT
DRAPE LAPAROSCOPIC ABDOMINAL (DRAPES) ×2 IMPLANT
DRAPE PROXIMA HALF (DRAPES) IMPLANT
DRAPE UTILITY XL STRL (DRAPES) ×4 IMPLANT
DRAPE WARM FLUID 44X44 (DRAPE) ×2 IMPLANT
DRSG OPSITE POSTOP 4X10 (GAUZE/BANDAGES/DRESSINGS) IMPLANT
DRSG OPSITE POSTOP 4X8 (GAUZE/BANDAGES/DRESSINGS) IMPLANT
DRSG VAC ATS LRG SENSATRAC (GAUZE/BANDAGES/DRESSINGS) IMPLANT
DRSG VAC ATS MED SENSATRAC (GAUZE/BANDAGES/DRESSINGS) ×2 IMPLANT
DRSG VAC ATS SM SENSATRAC (GAUZE/BANDAGES/DRESSINGS) ×2 IMPLANT
DRSG VERSA FOAM LRG 10X15 (GAUZE/BANDAGES/DRESSINGS) IMPLANT
ELECT BLADE 6.5 EXT (BLADE) IMPLANT
ELECT CAUTERY BLADE 6.4 (BLADE) ×2 IMPLANT
ELECT REM PT RETURN 9FT ADLT (ELECTROSURGICAL) ×2
ELECTRODE REM PT RTRN 9FT ADLT (ELECTROSURGICAL) ×1 IMPLANT
GLOVE BIO SURGEON STRL SZ 6.5 (GLOVE) ×4 IMPLANT
GLOVE BIOGEL PI IND STRL 7.0 (GLOVE) ×2 IMPLANT
GLOVE BIOGEL PI INDICATOR 7.0 (GLOVE) ×2
GLOVE SURG SIGNA 7.5 PF LTX (GLOVE) ×2 IMPLANT
GLOVE SURG SS PI 7.5 STRL IVOR (GLOVE) ×2 IMPLANT
GOWN STRL REUS W/ TWL LRG LVL3 (GOWN DISPOSABLE) ×2 IMPLANT
GOWN STRL REUS W/ TWL XL LVL3 (GOWN DISPOSABLE) ×1 IMPLANT
GOWN STRL REUS W/TWL LRG LVL3 (GOWN DISPOSABLE) ×4
GOWN STRL REUS W/TWL XL LVL3 (GOWN DISPOSABLE) ×2
KIT BASIN OR (CUSTOM PROCEDURE TRAY) ×2 IMPLANT
KIT ROOM TURNOVER OR (KITS) ×2 IMPLANT
LIGASURE IMPACT 36 18CM CVD LR (INSTRUMENTS) IMPLANT
NS IRRIG 1000ML POUR BTL (IV SOLUTION) ×4 IMPLANT
PACK GENERAL/GYN (CUSTOM PROCEDURE TRAY) ×2 IMPLANT
PAD ARMBOARD 7.5X6 YLW CONV (MISCELLANEOUS) ×2 IMPLANT
PENCIL BUTTON HOLSTER BLD 10FT (ELECTRODE) IMPLANT
SPECIMEN JAR LARGE (MISCELLANEOUS) IMPLANT
SPONGE ABDOMINAL VAC ABTHERA (MISCELLANEOUS) IMPLANT
SPONGE LAP 18X18 X RAY DECT (DISPOSABLE) IMPLANT
STAPLER VISISTAT 35W (STAPLE) ×2 IMPLANT
SUCTION POOLE TIP (SUCTIONS) ×2 IMPLANT
SUT PDS AB 1 TP1 96 (SUTURE) ×4 IMPLANT
SUT SILK 2 0 SH CR/8 (SUTURE) ×2 IMPLANT
SUT SILK 2 0 TIES 10X30 (SUTURE) ×2 IMPLANT
SUT SILK 3 0 SH CR/8 (SUTURE) ×2 IMPLANT
SUT SILK 3 0 TIES 10X30 (SUTURE) ×2 IMPLANT
SUT VIC AB 3-0 SH 18 (SUTURE) IMPLANT
TOWEL OR 17X24 6PK STRL BLUE (TOWEL DISPOSABLE) ×2 IMPLANT
TOWEL OR 17X26 10 PK STRL BLUE (TOWEL DISPOSABLE) ×2 IMPLANT
TRAY FOLEY CATH 16FRSI W/METER (SET/KITS/TRAYS/PACK) IMPLANT
TUBE CONNECTING 12X1/4 (SUCTIONS) IMPLANT
YANKAUER SUCT BULB TIP NO VENT (SUCTIONS) IMPLANT

## 2014-06-21 NOTE — Op Note (Signed)
ABDOMINAL WOUND EXPLORATION, APPLICATION OF WOUND VAC  Procedure Note  [name] 06/17/2014 - 06/21/2014   Pre-op Diagnosis: Open Wound     Post-op Diagnosis: same  Procedure(s): ABDOMINAL WOUND EXPLORATION APPLICATION OF WOUND VAC  Surgeon(s): Coralie Keens, MD  Anesthesia: General  Staff:  Circulator: Cyd Silence, RN Scrub Person: Tomma Rakers Sipsis, RN; Jinny Blossom Day Cavanaugh, RN Circulator Assistant: Montel Culver, RN; Jaci Standard, RN  Estimated Blood Loss: Minimal                         Coralie Keens A   Date: 06/21/2014  Time: 2:23 PM

## 2014-06-21 NOTE — Anesthesia Postprocedure Evaluation (Signed)
  Anesthesia Post-op Note  Patient: [name]  Procedure(s) Performed: Procedure(s): ABDOMINAL WOUND EXPLORATION (N/A) APPLICATION OF WOUND VAC (N/A)  Patient Location: PACU  Anesthesia Type:General  Level of Consciousness: awake, sedated, patient cooperative and confused  Airway and Oxygen Therapy: Patient Spontanous Breathing  Post-op Pain: mild  Post-op Assessment: Post-op Vital signs reviewed, Patient's Cardiovascular Status Stable, Respiratory Function Stable, Patent Airway, No signs of Nausea or vomiting and Pain level controlled  Post-op Vital Signs: stable  Last Vitals:  Filed Vitals:   06/21/14 1447  BP: 100/64  Pulse: 92  Temp:   Resp:     Complications: No apparent anesthesia complications

## 2014-06-21 NOTE — Anesthesia Preprocedure Evaluation (Addendum)
Anesthesia Evaluation  Patient identified by MRN, date of birth, ID band Patient awake    Reviewed: Allergy & Precautions, NPO status , Patient's Chart, lab work & pertinent test results  Airway Mallampati: II  TM Distance: >3 FB Neck ROM: Full    Dental no notable dental hx. (+) Edentulous Upper, Edentulous Lower   Pulmonary Current Smoker,  breath sounds clear to auscultation  Pulmonary exam normal       Cardiovascular hypertension, Pt. on medications and Pt. on home beta blockers     Neuro/Psych CVA, No Residual Symptoms    GI/Hepatic GERD-  ,(+) Hepatitis -, COpen abdominal wound   Endo/Other  diabetes, Type 2, Oral Hypoglycemic AgentsMorbid obesity  Renal/GU negative Renal ROS  negative genitourinary   Musculoskeletal negative musculoskeletal ROS (+)   Abdominal   Peds negative pediatric ROS (+)  Hematology  (+) anemia ,   Anesthesia Other Findings   Reproductive/Obstetrics negative OB ROS                            Anesthesia Physical  Anesthesia Plan  ASA: III  Anesthesia Plan: General   Post-op Pain Management:    Induction: Intravenous  Airway Management Planned: Oral ETT  Additional Equipment:   Intra-op Plan:   Post-operative Plan: Extubation in OR  Informed Consent: I have reviewed the patients History and Physical, chart, labs and discussed the procedure including the risks, benefits and alternatives for the proposed anesthesia with the patient or authorized representative who has indicated his/her understanding and acceptance.   Dental advisory given  Plan Discussed with: CRNA and Surgeon  Anesthesia Plan Comments:         Anesthesia Quick Evaluation

## 2014-06-21 NOTE — Progress Notes (Signed)
Patient ID: [name], female   DOB: 1950/09/11, 64 y.o.   MRN: [id number]   I had a long discussion with [name].  This was our fear prior to the hernia surgery. Will plan on going to the OR today for wound exploration and probable VAC placement with potential removal of the mesh. I discussed the risks with her in detail.  She agrees to proceed.

## 2014-06-21 NOTE — Progress Notes (Signed)
Pt very drowsy this am and shallow breathing. Placed pt on 02 @ 3L and encouraged to deep breath via the nares. 02 increased to 94%. Will inform oncoming RN

## 2014-06-21 NOTE — Transfer of Care (Signed)
Immediate Anesthesia Transfer of Care Note  Patient: [name]  Procedure(s) Performed: Procedure(s): ABDOMINAL WOUND EXPLORATION (N/A) APPLICATION OF WOUND VAC (N/A)  Patient Location: PACU  Anesthesia Type:General  Level of Consciousness: awake, alert , oriented, sedated, patient cooperative and responds to stimulation  Airway & Oxygen Therapy: Patient Spontanous Breathing and Patient connected to face mask oxygen  Post-op Assessment: Report given to PACU RN, Post -op Vital signs reviewed and stable and Patient moving all extremities  Post vital signs: Reviewed and stable  Complications: No apparent anesthesia complications

## 2014-06-21 NOTE — Care Management Note (Signed)
  Page 1 of 1   06/21/2014     2:56:43 PM CARE MANAGEMENT NOTE 06/21/2014  Patient:  [name], [name]   Account Number:  [id number]  Date Initiated:  06/21/2014  Documentation initiated by:  Magdalen Spatz  Subjective/Objective Assessment:     Action/Plan:   Anticipated DC Date:     Anticipated DC Plan:  [place]         Choice offered to / List presented to:             Status of service:   Medicare Important Message given?  YES (If response is "NO", the following Medicare IM given date fields will be blank) Date Medicare IM given:  06/21/2014 Medicare IM given by:  Magdalen Spatz Date Additional Medicare IM given:   Additional Medicare IM given by:    Discharge Disposition:    Per UR Regulation:    If discussed at Long Length of Stay Meetings, dates discussed:    Comments:  06-21-14 Patient for possible VAC placement today in surgery. MD please sign VAC application on shadow chart and provide measurements if home VAC needed . Thanks Magdalen Spatz RN BSN

## 2014-06-22 ENCOUNTER — Encounter (HOSPITAL_COMMUNITY): Payer: Self-pay | Admitting: Surgery

## 2014-06-22 LAB — GLUCOSE, CAPILLARY
GLUCOSE-CAPILLARY: 75 mg/dL (ref 70–99)
Glucose-Capillary: 145 mg/dL — ABNORMAL HIGH (ref 70–99)
Glucose-Capillary: 169 mg/dL — ABNORMAL HIGH (ref 70–99)
Glucose-Capillary: 90 mg/dL (ref 70–99)

## 2014-06-22 MED ORDER — WHITE PETROLATUM GEL
Status: AC
  Administered 2014-06-22: 0.2
  Filled 2014-06-22: qty 1

## 2014-06-22 NOTE — Op Note (Signed)
NAMEURIJAH, RAYNOR NO.:  [id number]  MEDICAL RECORD NO.:  [id number]  LOCATION:  6N23C                        FACILITY:  [facility]  PHYSICIAN:  Coralie Keens, M.D. DATE OF BIRTH:  [birth year]  DATE OF PROCEDURE:  06/21/2014 DATE OF DISCHARGE:                              OPERATIVE REPORT   PREOPERATIVE DIAGNOSIS:  Open abdominal wound.  POSTOPERATIVE DIAGNOSIS:  Open abdominal wound.  PROCEDURE:  Abdominal wound exploration with placement of wound VAC.  SURGEON:  Coralie Keens, M.D.  ANESTHESIA:  General.  ESTIMATED BLOOD LOSS:  Minimal.  INDICATIONS:  This is a 64 year old female who is status post an open incisional hernia repair with mesh approximately 3 weeks ago.  She has now had breakdown of her midline wound.  Decision has been made to proceed with washout of the abdominal wound and placement of wound VAC.  PROCEDURE IN DETAIL:  The patient was brought to the operating room, identified as, Estil Daft.  She was placed supine on the operating table and general anesthesia was induced.  Her dressings were removed from her abdomen.  Her abdomen was then prepped and draped in usual sterile fashion.  There was already an open wound with exposed mesh. The mesh itself was already significantly incorporated into the fascia. It appeared clean.  There was no purulence identified.  I opened the midline wound larger to fit a wound VAC more easily and then irrigated the open wound with several liters of normal saline.  I then placed a wound VAC sponge into the wound and secured it in place with adhesive. The sponge was then placed to the wound VAC tubing and suction was obtained.  At this point, the procedure was terminated.  The patient tolerated the procedure well.  All the counts were correct at the end of procedure.  The patient was then extubated in operating room and taken in stable condition to the recovery room.     Coralie Keens,  M.D.     DB/MEDQ  D:  06/21/2014  T:  06/22/2014  Job:  [id number]

## 2014-06-22 NOTE — Progress Notes (Signed)
VAC application faxed to [facility] along with MD's signed Rx.  [facility] chosen by patient for [facility] agency. Address and phone number confirmed as correct in EPIC.  Sandi Mariscal, RN BSN MHA CCM  Case Manager, Trauma Service/Unit 17M [phone]

## 2014-06-22 NOTE — Progress Notes (Signed)
1 Day Post-Op  Subjective: Looks good today No complaints  Objective: Vital signs in last 24 hours: Temp:  [97.1 F (36.2 C)-99.9 F (37.7 C)] 99.9 F (37.7 C) (01/26 0500) Pulse Rate:  [82-97] 97 (01/26 0500) Resp:  [9-16] 16 (01/26 0500) BP: (100-111)/(53-72) 111/63 mmHg (01/26 0500) SpO2:  [82 %-96 %] 96 % (01/26 0500) Last BM Date: 06/21/14  Intake/Output from previous day: 01/25 0701 - 01/26 0700 In: 2092.5 [P.O.:240; I.V.:1752.5; IV Piggyback:100] Out: 100 [Drains:100] Intake/Output this shift:   Abdomen soft, non tender, VAC in place  Lab Results:  No results for input(s): WBC, HGB, HCT, PLT in the last 72 hours. BMET No results for input(s): NA, K, CL, CO2, GLUCOSE, BUN, CREATININE, CALCIUM in the last 72 hours. PT/INR No results for input(s): LABPROT, INR in the last 72 hours. ABG No results for input(s): PHART, HCO3 in the last 72 hours.  Invalid input(s): PCO2, PO2  Studies/Results: No results found.  Anti-infectives: Anti-infectives    Start     Dose/Rate Route Frequency Ordered Stop   06/21/14 1415  clindamycin (CLEOCIN) IVPB 600 mg     600 mg100 mL/hr over 30 Minutes Intravenous  Once 06/21/14 1412 06/21/14 1445   06/17/14 2215  clindamycin (CLEOCIN) IVPB 600 mg     600 mg100 mL/hr over 30 Minutes Intravenous 3 times per day 06/17/14 2155        Assessment/Plan: s/p Procedure(s): ABDOMINAL WOUND EXPLORATION (N/A) APPLICATION OF WOUND VAC (N/A)  Will have VAC changed tomorrow Will go home with VAC and home health hopefully thursday  LOS: 5 days    [name] A 06/22/2014

## 2014-06-23 LAB — GLUCOSE, CAPILLARY
GLUCOSE-CAPILLARY: 101 mg/dL — AB (ref 70–99)
Glucose-Capillary: 105 mg/dL — ABNORMAL HIGH (ref 70–99)
Glucose-Capillary: 121 mg/dL — ABNORMAL HIGH (ref 70–99)
Glucose-Capillary: 92 mg/dL (ref 70–99)

## 2014-06-23 NOTE — Progress Notes (Signed)
2 Days Post-Op  Subjective: No complaints  Objective: Vital signs in last 24 hours: Temp:  [98.6 F (37 C)-98.7 F (37.1 C)] 98.6 F (37 C) (01/27 0547) Pulse Rate:  [81-84] 84 (01/27 0547) Resp:  [16-18] 18 (01/27 0547) BP: (94-110)/(60-62) 110/62 mmHg (01/27 0547) SpO2:  [92 %-93 %] 92 % (01/27 0547) Last BM Date: 06/22/14  Intake/Output from previous day: 01/26 0701 - 01/27 0700 In: 2025 [P.O.:240; I.V.:1685; IV Piggyback:100] Out: 21 [Urine:340; Drains:150] Intake/Output this shift:    Abdomen soft with VAC in place Minimally tender  Lab Results:  No results for input(s): WBC, HGB, HCT, PLT in the last 72 hours. BMET No results for input(s): NA, K, CL, CO2, GLUCOSE, BUN, CREATININE, CALCIUM in the last 72 hours. PT/INR No results for input(s): LABPROT, INR in the last 72 hours. ABG No results for input(s): PHART, HCO3 in the last 72 hours.  Invalid input(s): PCO2, PO2  Studies/Results: No results found.  Anti-infectives: Anti-infectives    Start     Dose/Rate Route Frequency Ordered Stop   06/21/14 1415  clindamycin (CLEOCIN) IVPB 600 mg     600 mg100 mL/hr over 30 Minutes Intravenous  Once 06/21/14 1412 06/21/14 1445   06/17/14 2215  clindamycin (CLEOCIN) IVPB 600 mg     600 mg100 mL/hr over 30 Minutes Intravenous 3 times per day 06/17/14 2155        Assessment/Plan: s/p Procedure(s): ABDOMINAL WOUND EXPLORATION (N/A) APPLICATION OF WOUND VAC (N/A)  For VAC change today Will continue IV antibiotics until discharge She wants to stay until Friday which I feel is reasonable for more antibiotics IV and to see how she handles the VAC change  LOS: 6 days    Laureano Hetzer A 06/23/2014

## 2014-06-23 NOTE — Consult Note (Addendum)
WOC wound consult note Reason for Consult: Bedside nurse requested assistance with Vac dressing change.  Pt is followed by CCS team for plan of care. Wound type: Full thickness post-op abd midline wound Measurement:16X12X5cm Wound bed: Exposed mesh visible to wound bed.  Beefy red wound surrounding. Drainage (amount, consistency, odor) Small amt red drainage, no odor. Periwound: Intact skin surrounding Dressing procedure/placement/frequency: Applied one piece of white foam to decrease discomfort with dressing change and protect inner mesh to wound bed.  Applied one piece of black foam to 170mm cont suction.  Pt tolerated with mod amt discomfort after meds given earlier.  Plan to assist with dressing change dressing on Friday if still in the hospital at that time.  Pt will discharge home with Vac and home health assistance according to EMR. Julien Girt MSN, RN, [place], [place], [place]

## 2014-06-24 LAB — GLUCOSE, CAPILLARY
GLUCOSE-CAPILLARY: 90 mg/dL (ref 70–99)
GLUCOSE-CAPILLARY: 100 mg/dL — AB (ref 70–99)
GLUCOSE-CAPILLARY: 104 mg/dL — AB (ref 70–99)
Glucose-Capillary: 89 mg/dL (ref 70–99)

## 2014-06-24 MED ORDER — CLINDAMYCIN HCL 300 MG PO CAPS
300.0000 mg | ORAL_CAPSULE | Freq: Three times a day (TID) | ORAL | Status: DC
  Administered 2014-06-24 – 2014-06-28 (×12): 300 mg via ORAL
  Filled 2014-06-24 (×18): qty 1

## 2014-06-24 NOTE — Progress Notes (Signed)
3 Days Post-Op  Subjective: comfortable  Objective: Vital signs in last 24 hours: Temp:  [99.4 F (37.4 C)-99.7 F (37.6 C)] 99.4 F (37.4 C) (01/28 0604) Pulse Rate:  [85-88] 85 (01/28 0604) Resp:  [17] 17 (01/28 0604) BP: (104-120)/(62-71) 120/71 mmHg (01/28 0604) SpO2:  [97 %] 97 % (01/28 0604) Last BM Date: 06/23/14  Intake/Output from previous day: 01/27 0701 - 01/28 0700 In: 1729.2 [P.O.:240; I.V.:1339.2; IV Piggyback:150] Out: 125 [Drains:125] Intake/Output this shift:    Abdomen soft, new VAC in place  Lab Results:  No results for input(s): WBC, HGB, HCT, PLT in the last 72 hours. BMET No results for input(s): NA, K, CL, CO2, GLUCOSE, BUN, CREATININE, CALCIUM in the last 72 hours. PT/INR No results for input(s): LABPROT, INR in the last 72 hours. ABG No results for input(s): PHART, HCO3 in the last 72 hours.  Invalid input(s): PCO2, PO2  Studies/Results: No results found.  Anti-infectives: Anti-infectives    Start     Dose/Rate Route Frequency Ordered Stop   06/21/14 1415  clindamycin (CLEOCIN) IVPB 600 mg     600 mg100 mL/hr over 30 Minutes Intravenous  Once 06/21/14 1412 06/21/14 1445   06/17/14 2215  clindamycin (CLEOCIN) IVPB 600 mg     600 mg100 mL/hr over 30 Minutes Intravenous 3 times per day 06/17/14 2155        Assessment/Plan: s/p Procedure(s): ABDOMINAL WOUND EXPLORATION (N/A) APPLICATION OF WOUND VAC (N/A)  Appreciate wound care's assistance with VAC change Will go ahead and change to oral antibiotics today Hopefully home tomorrow after VAC change, but she is now saying she may want to stay till [place]  LOS: 7 days    Arne Schlender A 06/24/2014

## 2014-06-24 NOTE — Progress Notes (Signed)
UR completed.  VAC delivering today for d/c planned for tomorrow.  HHRN arranged already with [facility].  Medicare IM (Important Message) delivered to patient today by me in anticipation of discharge.    Sandi Mariscal, RN BSN [facility] CCM Trauma/Neuro ICU Case Manager [phone]

## 2014-06-25 LAB — GLUCOSE, CAPILLARY
GLUCOSE-CAPILLARY: 133 mg/dL — AB (ref 70–99)
GLUCOSE-CAPILLARY: 121 mg/dL — AB (ref 70–99)
Glucose-Capillary: 83 mg/dL (ref 70–99)
Glucose-Capillary: 107 mg/dL — ABNORMAL HIGH (ref 70–99)

## 2014-06-25 MED ORDER — GLUCERNA SHAKE PO LIQD
237.0000 mL | Freq: Three times a day (TID) | ORAL | Status: DC
  Administered 2014-06-25 – 2014-06-28 (×8): 237 mL via ORAL

## 2014-06-25 NOTE — Progress Notes (Signed)
INITIAL NUTRITION ASSESSMENT  DOCUMENTATION CODES Per approved criteria  -Obesity Unspecified   INTERVENTION: -Glucerna Shake po TID, each supplement provides 220 kcal and 10 grams of protein -Provided Glucerna coupons, per pt request  NUTRITION DIAGNOSIS: Inadequate oral intake related to decreased appetite as evidenced by PO: 25%.   Goal: Pt will meet >90% of estimated nutritional needs  Monitor:  PO/supplement intake, labs, weight changes, I/O's  Reason for Assessment: MST=2  64 y.o. female  Admitting Dx: <principal problem not specified>  Anquanette is status post laparoscopic converted to open incisional hernia repair with onlay mesh by Dr. Nedra Hai on 06/01/2014. Her staples were removed in the office a few days ago. The lower portion of her wound was noted to dehis over the past 2 days by her home health nurse. She was directed to the emergency department. Her appetite has returned and she is moving her bowels. She denies fevers. Pain control has been adequate on orals.  ASSESSMENT: s/p Procedure(s) on 06/22/14: ABDOMINAL WOUND EXPLORATION (N/A) APPLICATION OF WOUND VAC (N/A)  Pt reports poor appetite x 1 month, around the time after her hernia repair. She reports that she does not have the desire to eat.Noted 25% meal completion.  She reveals that her UBW is in the 170# range and she has been trying to lose weight. She reveals that her leg prosthesis is ill fitting and unconfortable due to her gaining weight.  She reports she drinks Glucerna daily at home and is agreeable to get supplementation during hospitalization. She voices concern with ability to afford supplements after discharge. RD provided coupons, for which pt was very grateful.  Discussed importance of good meal and supplement intake to promote healing. Discussed ways to increase protein diet and examples of high protein food sources (milk, ice cream, pudding, yogurt, and nut butter). Also discussed ways to obtain  slow, gradual weight loss to work towards weight loss goals.  Nutrition-focused physical exam revealed no signs of fat or muscle depletion.  Labs reviewed. Na: 133, K: 3.3, Cl: 95,  Calcium: 7.6, Glucose: 122. CBGS: 89-121.  Height: Ht Readings from Last 1 Encounters:  06/17/14 5\' 3"  (1.6 m)    Weight: Wt Readings from Last 1 Encounters:  06/17/14 187 lb (84.823 kg)    Ideal Body Weight: 106%  % Ideal Body Weight: 176%  Wt Readings from Last 10 Encounters:  06/17/14 187 lb (84.823 kg)  06/03/14 187 lb 15.8 oz (85.27 kg)  05/27/14 188 lb 3.2 oz (85.367 kg)  05/14/14 185 lb 6.4 oz (84.097 kg)  03/19/14 181 lb 14.4 oz (82.509 kg)  03/15/14 182 lb 8 oz (82.781 kg)  02/18/14 173 lb 1.6 oz (78.518 kg)  01/15/14 177 lb 12.8 oz (80.65 kg)  12/18/13 174 lb 11.2 oz (79.243 kg)  11/20/13 175 lb 9.6 oz (79.652 kg)    Usual Body Weight: 175#  % Usual Body Weight: 107%  BMI:  37.7- meets criteria for obesity, class II  Estimated Nutritional Needs: Kcal: 1700-1900 Protein: 74-84 grams Fluid: 1.7-1.9 L  Skin: abdominal wound vac  Diet Order: Diet Carb Modified  EDUCATION NEEDS: -Education needs addressed   Intake/Output Summary (Last 24 hours) at 06/25/14 1153 Last data filed at 06/25/14 1000  Gross per 24 hour  Intake    360 ml  Output    535 ml  Net   -175 ml    Last BM: 06/23/14  Labs:   Recent Labs Lab 06/19/14 0050  NA 133*  K 3.3*  CL 95*  CO2 26  BUN 14  CREATININE 0.98  CALCIUM 7.6*  GLUCOSE 122*    CBG (last 3)   Recent Labs  06/24/14 2112 06/25/14 0801 06/25/14 1141  GLUCAP 89 133* 121*    Scheduled Meds: . aspirin EC  81 mg Oral Daily  . atenolol  50 mg Oral q morning - 10a  . clindamycin  300 mg Oral 3 times per day  . fluorometholone  1 drop Left Eye BID  . FLUoxetine  40 mg Oral Daily  . gabapentin  800 mg Oral TID  . heparin  5,000 Units Subcutaneous 3 times per day  . hydrochlorothiazide  25 mg Oral q morning - 10a  .  insulin aspart  0-15 Units Subcutaneous TID WC  . insulin aspart  0-5 Units Subcutaneous QHS  . lisinopril  40 mg Oral q morning - 10a  . metFORMIN  500 mg Oral Q breakfast  . pantoprazole  40 mg Oral Daily  . prednisoLONE acetate  1 drop Right Eye Daily  . traZODone  100 mg Oral QHS  . vitamin C  500 mg Oral q morning - 10a    Continuous Infusions: . sodium chloride 10 mL/hr at 06/24/14 0909  . lactated ringers 20 mL/hr at 06/21/14 1313    Past Medical History  Diagnosis Date  . Hypertension   . Colon polyps   . Stomach problems   . Hypokalemic alkalosis   . Allergic rhinitis   . GERD (gastroesophageal reflux disease)   . Stroke 2010    no residual problems  . Anemia, iron deficiency 05/03/2011    recieves iron infusions  . Anemia   . GI bleeding   . Diverticulitis   . Transfusion history   . Difficulty sleeping     takes trazadone for sleep  . Peripheral vascular disease   . Hepatitis     hep C  . Anxiety   . Shortness of breath     due to anemia    Past Surgical History  Procedure Laterality Date  . Leg amputation above knee    . Esophagogastroduodenoscopy  05/05/2011    Procedure: ESOPHAGOGASTRODUODENOSCOPY (EGD);  Surgeon: Zenovia Jarred, MD;  Location: [facility];  Service: Gastroenterology;  Laterality: N/A;  Dr. Hilarie Fredrickson will do procedure for Dr. Benson Norway Saturday.  . Esophagogastroduodenoscopy  05/08/2011    Procedure: ESOPHAGOGASTRODUODENOSCOPY (EGD);  Surgeon: Beryle Beams;  Location: [facility];  Service: Endoscopy;  Laterality: N/A;  . Esophagogastroduodenoscopy  06/07/2011    Procedure: ESOPHAGOGASTRODUODENOSCOPY (EGD);  Surgeon: Beryle Beams, MD;  Location: [facility];  Service: Endoscopy;  Laterality: N/A;  . Hot hemostasis  06/07/2011    Procedure: HOT HEMOSTASIS (ARGON PLASMA COAGULATION/BICAP);  Surgeon: Beryle Beams, MD;  Location: [facility];  Service: Endoscopy;  Laterality: N/A;  . Esophagogastroduodenoscopy  12/20/2011    Procedure:  ESOPHAGOGASTRODUODENOSCOPY (EGD);  Surgeon: Beryle Beams, MD;  Location: [facility];  Service: Endoscopy;  Laterality: N/A;  . Flexible sigmoidoscopy  12/21/2011    Procedure: FLEXIBLE SIGMOIDOSCOPY;  Surgeon: Beryle Beams, MD;  Location: [facility];  Service: Endoscopy;  Laterality: N/A;  . Carpal tunnel release      rt hand  . Partial colectomy  02/15/2012    Procedure: PARTIAL COLECTOMY;  Surgeon: Harl Bowie, MD;  Location: [facility];  Service: General;  Laterality: N/A;  . Laparotomy  02/16/2012    Procedure: EXPLORATORY LAPAROTOMY;  Surgeon: Edward Jolly, MD;  Location: [facility];  Service: General;  Laterality: N/A;  oversewing of anastomotic leak and rigid sigmoidoscopy  . Enteroscopy N/A 12/04/2012    Procedure: ENTEROSCOPY;  Surgeon: Beryle Beams, MD;  Location: [facility];  Service: Endoscopy;  Laterality: N/A;  . Enteroscopy N/A 12/18/2012    Procedure: ENTEROSCOPY;  Surgeon: Beryle Beams, MD;  Location: [facility];  Service: Endoscopy;  Laterality: N/A;  . Eye surgery Bilateral     cataracts  . Tonsillectomy    . Abdominal hysterectomy    . Insertion of mesh N/A 06/01/2014    Procedure: INSERTION OF MESH;  Surgeon: Coralie Keens, MD;  Location: [facility];  Service: General;  Laterality: N/A;  . Incisional hernia repair N/A 06/01/2014    Procedure: ATTEMPTED LAPAROSCOPIC AND OPEN INCISIONAL HERNIA REPAIR WITH MESH;  Surgeon: Coralie Keens, MD;  Location: [facility];  Service: General;  Laterality: N/A;  . Laparotomy N/A 06/21/2014    Procedure: ABDOMINAL WOUND EXPLORATION;  Surgeon: Coralie Keens, MD;  Location: [facility];  Service: General;  Laterality: N/A;  . Application of wound vac N/A 06/21/2014    Procedure: APPLICATION OF WOUND VAC;  Surgeon: Coralie Keens, MD;  Location: [facility];  Service: General;  Laterality: N/A;    Briannon Boggio A. Jimmye Norman, RD, LDN, CDE Pager: [phone] After hours Pager: [phone]

## 2014-06-25 NOTE — Progress Notes (Signed)
4 Days Post-Op  Subjective: Still with moderate pain with VAC changes  Objective: Vital signs in last 24 hours: Temp:  [98 F (36.7 C)-99.7 F (37.6 C)] 99.6 F (37.6 C) (01/29 0644) Pulse Rate:  [80-88] 88 (01/29 0644) Resp:  [16-18] 17 (01/29 0644) BP: (96-122)/(50-72) 96/50 mmHg (01/29 0644) SpO2:  [91 %-98 %] 91 % (01/29 0644) Last BM Date: 06/23/14  Intake/Output from previous day: 01/28 0701 - 01/29 0700 In: 653.7 [P.O.:480; I.V.:173.7] Out: 85 [Drains:85] Intake/Output this shift:    Her abdomen is soft and the underlying fascia and mesh look good.  There is superficial subcut tissue sloughing.  There is much less undermining from 9 o'clock to 3 o'clock but 4 cm from 3 back to 9  Lab Results:  No results for input(s): WBC, HGB, HCT, PLT in the last 72 hours. BMET No results for input(s): NA, K, CL, CO2, GLUCOSE, BUN, CREATININE, CALCIUM in the last 72 hours. PT/INR No results for input(s): LABPROT, INR in the last 72 hours. ABG No results for input(s): PHART, HCO3 in the last 72 hours.  Invalid input(s): PCO2, PO2  Studies/Results: No results found.  Anti-infectives: Anti-infectives    Start     Dose/Rate Route Frequency Ordered Stop   06/24/14 0915  clindamycin (CLEOCIN) capsule 300 mg     300 mg Oral 3 times per day 06/24/14 0909     06/21/14 1415  clindamycin (CLEOCIN) IVPB 600 mg     600 mg100 mL/hr over 30 Minutes Intravenous  Once 06/21/14 1412 06/21/14 1445   06/17/14 2215  clindamycin (CLEOCIN) IVPB 600 mg  Status:  Discontinued     600 mg100 mL/hr over 30 Minutes Intravenous 3 times per day 06/17/14 2155 06/24/14 0909      Assessment/Plan: s/p Procedure(s): ABDOMINAL WOUND EXPLORATION (N/A) APPLICATION OF WOUND VAC (N/A)  For pain control, she will need to stay until at least Monday for another VAC change.  She is still requiring IV meds with changes  LOS: 8 days    Courtney Fenlon A 06/25/2014

## 2014-06-25 NOTE — Consult Note (Addendum)
Reason for Consult: Nursing student assisted with Vac dressing change. Pt is followed by CCS team for plan of care. Wound type: Full thickness post-op abd midline wound.  Dr Ninfa Linden in to assess wound and clean with gauze.  Undermining present to 4 cm from 3:00 o'clock to 9:00 o'clock. Measurement:16X12X5cm Wound bed: Exposed mesh visible to wound bed. Beefy red wound surrounding. Drainage (amount, consistency, odor) Large amt red drainage in cannister; 500cc, some odor. Periwound: Intact skin surrounding Dressing procedure/placement/frequency: Applied one piece of white foam to decrease discomfort with dressing change and protect inner mesh to wound bed. Applied one piece of black foam to 148mm cont suction. Pt tolerated with mod amt discomfort after meds given earlier. Plan to assist with dressing change dressing on Monday if still in the hospital at that time. Pt will discharge home with Vac and home health assistance according to EMR. Julien Girt MSN, RN, [place], [place], [place]

## 2014-06-26 LAB — GLUCOSE, CAPILLARY
GLUCOSE-CAPILLARY: 119 mg/dL — AB (ref 70–99)
GLUCOSE-CAPILLARY: 117 mg/dL — AB (ref 70–99)
Glucose-Capillary: 112 mg/dL — ABNORMAL HIGH (ref 70–99)
Glucose-Capillary: 117 mg/dL — ABNORMAL HIGH (ref 70–99)

## 2014-06-26 NOTE — Progress Notes (Signed)
5 Days Post-Op  Subjective: Still with moderate pain with VAC changes  Objective: Vital signs in last 24 hours: Temp:  [98.1 F (36.7 C)-98.9 F (37.2 C)] 98.1 F (36.7 C) (01/30 0514) Pulse Rate:  [81-100] 81 (01/30 0514) Resp:  [16-17] 17 (01/30 0514) BP: (85-118)/(46-74) 85/46 mmHg (01/30 0514) SpO2:  [90 %-94 %] 90 % (01/30 0514) Last BM Date: 06/26/14  Intake/Output from previous day: 01/29 0701 - 01/30 0700 In: 360 [P.O.:360] Out: 175 [Drains:175] Intake/Output this shift:    Her abdomen is soft, wound vac in place  Lab Results:  No results for input(s): WBC, HGB, HCT, PLT in the last 72 hours. BMET No results for input(s): NA, K, CL, CO2, GLUCOSE, BUN, CREATININE, CALCIUM in the last 72 hours. PT/INR No results for input(s): LABPROT, INR in the last 72 hours. ABG No results for input(s): PHART, HCO3 in the last 72 hours.  Invalid input(s): PCO2, PO2  Studies/Results: No results found.  Anti-infectives: Anti-infectives    Start     Dose/Rate Route Frequency Ordered Stop   06/24/14 0915  clindamycin (CLEOCIN) capsule 300 mg     300 mg Oral 3 times per day 06/24/14 0909     06/21/14 1415  clindamycin (CLEOCIN) IVPB 600 mg     600 mg100 mL/hr over 30 Minutes Intravenous  Once 06/21/14 1412 06/21/14 1445   06/17/14 2215  clindamycin (CLEOCIN) IVPB 600 mg  Status:  Discontinued     600 mg100 mL/hr over 30 Minutes Intravenous 3 times per day 06/17/14 2155 06/24/14 0909      Assessment/Plan: s/p Procedure(s): ABDOMINAL WOUND EXPLORATION (N/A) APPLICATION OF WOUND VAC (N/A)  For pain control, she will need to stay until at least Monday for another VAC change.  She is still requiring IV meds with changes  LOS: 9 days    Braelynne Garinger C. 1/79/1505

## 2014-06-26 NOTE — Progress Notes (Signed)
Patient refused Clindamycin capsule scheduled on 2200 despite 3 attempts and education.  Patient stated, "It makes my stomach sick".

## 2014-06-27 LAB — GLUCOSE, CAPILLARY
GLUCOSE-CAPILLARY: 101 mg/dL — AB (ref 70–99)
GLUCOSE-CAPILLARY: 130 mg/dL — AB (ref 70–99)
GLUCOSE-CAPILLARY: 87 mg/dL (ref 70–99)
Glucose-Capillary: 102 mg/dL — ABNORMAL HIGH (ref 70–99)

## 2014-06-27 NOTE — Progress Notes (Signed)
6 Days Post-Op  Subjective: Feels a bit better today, tolerating a diet.  Ready for d/c tom  Objective: Vital signs in last 24 hours: Temp:  [98.7 F (37.1 C)-99 F (37.2 C)] 98.7 F (37.1 C) (01/31 0624) Pulse Rate:  [77-83] 82 (01/31 0624) Resp:  [16-18] 16 (01/31 0624) BP: (91-103)/(56-61) 91/56 mmHg (01/31 0624) SpO2:  [91 %-95 %] 91 % (01/31 0624) Last BM Date: 06/26/14  Intake/Output from previous day: 01/30 0701 - 01/31 0700 In: 474 [P.O.:474] Out: 250 [Drains:250] Intake/Output this shift:    Her abdomen is soft, wound vac in place  Lab Results:  No results for input(s): WBC, HGB, HCT, PLT in the last 72 hours. BMET No results for input(s): NA, K, CL, CO2, GLUCOSE, BUN, CREATININE, CALCIUM in the last 72 hours. PT/INR No results for input(s): LABPROT, INR in the last 72 hours. ABG No results for input(s): PHART, HCO3 in the last 72 hours.  Invalid input(s): PCO2, PO2  Studies/Results: No results found.  Anti-infectives: Anti-infectives    Start     Dose/Rate Route Frequency Ordered Stop   06/24/14 0915  clindamycin (CLEOCIN) capsule 300 mg     300 mg Oral 3 times per day 06/24/14 0909     06/21/14 1415  clindamycin (CLEOCIN) IVPB 600 mg     600 mg100 mL/hr over 30 Minutes Intravenous  Once 06/21/14 1412 06/21/14 1445   06/17/14 2215  clindamycin (CLEOCIN) IVPB 600 mg  Status:  Discontinued     600 mg100 mL/hr over 30 Minutes Intravenous 3 times per day 06/17/14 2155 06/24/14 0909      Assessment/Plan: s/p Procedure(s): ABDOMINAL WOUND EXPLORATION (N/A) APPLICATION OF WOUND VAC (N/A)  For pain control, she will need to stay until at least Monday for another VAC change. Probable discharge after that    LOS: 10 days    Semaje Kinker C. 02/17/3006

## 2014-06-28 ENCOUNTER — Ambulatory Visit: Payer: Medicare Other | Admitting: Hematology

## 2014-06-28 ENCOUNTER — Other Ambulatory Visit: Payer: Medicare Other

## 2014-06-28 ENCOUNTER — Other Ambulatory Visit: Payer: Self-pay | Admitting: *Deleted

## 2014-06-28 LAB — GLUCOSE, CAPILLARY: Glucose-Capillary: 114 mg/dL — ABNORMAL HIGH (ref 70–99)

## 2014-06-28 MED ORDER — DOXYCYCLINE HYCLATE 100 MG PO TABS: 100.0000 mg | ORAL_TABLET | Freq: Two times a day (BID) | ORAL | Status: DC

## 2014-06-28 MED ORDER — OXYCODONE-ACETAMINOPHEN 10-325 MG PO TABS: 1.0000 | ORAL_TABLET | ORAL | Status: DC | PRN

## 2014-06-28 NOTE — Consult Note (Signed)
Reason for Consult: Nursing student assisted with Vac dressing change. Pt is followed by CCS team for plan of care. Wound type: Full thickness post-op abd midline wound. Wound appearance unchanged from Friday; beefy red wound edges. Wound bed: Exposed mesh visible to wound bed.  Drainage (amount, consistency, odor) Large amt red drainage in cannister; 400cc, strong odor. Periwound: Intact skin surrounding Dressing procedure/placement/frequency: Applied one piece of white foam to decrease discomfort with dressing change and protect inner mesh to wound bed. Applied one piece of black foam to 161mm cont suction. Pt tolerated with mod amt discomfort after meds given earlier. Placed on home vac in room and discussed troubleshooting with patient.  Supplies at bedside; pt plans to discharge this afternoon with home health assistance.  Julien Girt MSN, RN, [place], [place], [place]

## 2014-06-28 NOTE — Progress Notes (Signed)
Patient ID: [name], female   DOB: 06-25-1950, 65 y.o.   MRN: [id number]  Feels ready to go home today Mild pain No fevers.  Will discharge home with home health after VAC change

## 2014-06-28 NOTE — Progress Notes (Signed)
Patient discharged to home with instructions, pt connected to home wound vac, dressings changed by wound care.

## 2014-06-28 NOTE — Discharge Instructions (Signed)
Home health for [facility] change

## 2014-06-28 NOTE — Progress Notes (Signed)
HHRN/PT resumed with [facility] according to pt choice.  Medicare IM (Important Message) delivered to patient today by me in anticipation of discharge.   Sandi Mariscal, RN BSN [facility] CCM  Case Manager, Trauma Service/Unit 8M [phone]

## 2014-06-28 NOTE — Discharge Summary (Signed)
Physician Discharge Summary  Patient ID: [name] MRN: [id number] DOB/AGE: 1951-01-14 64 y.o.  Admit date: 06/17/2014 Discharge date: 06/28/2014  Admission Diagnoses:  Discharge Diagnoses:  Active Problems:   Wound, surgical, infected acute renal failute  Discharged Condition: good  Hospital Course: admitted with open wound s/p hernia repair.  Started on IV antibiotics.  Went to OR for wash out and placement of wound VAC.  Kept in the hospital for IV antibiotics and VAC changes.  Had elevated creatinine at admission but quickly came to normal with IV fluids.  Suspected secondary to dehydration  Consults: None  Significant Diagnostic Studies:   Treatments: surgery:   Discharge Exam: Blood pressure 104/58, pulse 81, temperature 98.3 F (36.8 C), temperature source Oral, resp. rate 16, height 5\' 3"  (1.6 m), weight 187 lb (84.823 kg), SpO2 95 %. General appearance: alert, cooperative and no distress Resp: clear to auscultation bilaterally Cardio: regular rate and rhythm, S1, S2 normal, no murmur, click, rub or gallop Incision/Wound: VAC in place, wound clean  Disposition: 01-Home or Self Care     Medication List    TAKE these medications        ACCU-CHEK FASTCLIX LANCETS Misc  Check your blood sugar one time a day     ALREX 0.2 % Susp  Generic drug:  loteprednol     aspirin EC 81 MG tablet  Take 81 mg by mouth daily.     atenolol 50 MG tablet  Commonly known as:  TENORMIN  Take 50 mg by mouth every morning.     cetirizine 10 MG tablet  Commonly known as:  ZYRTEC  Take 10 mg by mouth every morning.     cyclobenzaprine 10 MG tablet  Commonly known as:  FLEXERIL  Take 1 tablet (10 mg total) by mouth 2 (two) times daily as needed for muscle spasms.     doxycycline 100 MG tablet  Commonly known as:  VIBRA-TABS  Take 1 tablet (100 mg total) by mouth 2 (two) times daily.     esomeprazole 40 MG capsule  Commonly known as:  NEXIUM  Take 40 mg by mouth 2 (two)  times daily.     fluorometholone 0.1 % ophthalmic suspension  Commonly known as:  FML  Place 1 drop into the left eye 2 (two) times daily.     FLUoxetine 40 MG capsule  Commonly known as:  PROZAC  TAKE 1 CAPSULE BY MOUTH DAILY     gabapentin 400 MG capsule  Commonly known as:  NEURONTIN  Take 800 mg by mouth 3 (three) times daily.     glucose blood test strip  Commonly known as:  ACCU-CHEK SMARTVIEW  Check blood sugar  one time a day     hydrochlorothiazide 25 MG tablet  Commonly known as:  HYDRODIURIL  Take 25 mg by mouth every morning.     lactose free nutrition Liqd  Take 237 mLs by mouth 3 (three) times daily with meals.     lisinopril 40 MG tablet  Commonly known as:  PRINIVIL,ZESTRIL  Take 1 tablet (40 mg total) by mouth every morning.     magnesium (amino acid chelate) 133 MG tablet  Take 1 tablet by mouth 2 (two) times daily.     metFORMIN 500 MG tablet  Commonly known as:  GLUCOPHAGE  Take 500 mg by mouth daily with breakfast.     oxyCODONE-acetaminophen 10-325 MG per tablet  Commonly known as:  PERCOCET  Take 1 tablet by mouth every  6 (six) hours as needed for pain.     oxyCODONE-acetaminophen 5-325 MG per tablet  Commonly known as:  PERCOCET/ROXICET  Take 1-2 tablets by mouth every 4 (four) hours as needed for moderate pain or severe pain.     oxyCODONE-acetaminophen 10-325 MG per tablet  Commonly known as:  PERCOCET  Take 1 tablet by mouth every 4 (four) hours as needed for pain.     potassium chloride 20 MEQ/15ML (10%) Soln  TAKE 15ML BY MOUTH EVERY DAY     prednisoLONE acetate 1 % ophthalmic suspension  Commonly known as:  PRED FORTE  Place 1 drop into the right eye daily.     traMADol 50 MG tablet  Commonly known as:  ULTRAM  Take 1-2 tablets (50-100 mg total) by mouth every 6 (six) hours as needed.     traZODone 100 MG tablet  Commonly known as:  DESYREL  Take 100 mg by mouth at bedtime.     vitamin C 500 MG tablet  Commonly known as:   ASCORBIC ACID  Take 500 mg by mouth every morning.           Follow-up Information    Follow up with [facility], MD. Schedule an appointment as soon as possible for a visit on 07/05/2014.   Specialty:  General Surgery   Contact information:   [address] [place] [address] [phone]       Signed: Harl Bowie 06/28/2014, 6:39 AM

## 2014-06-29 DIAGNOSIS — Z48815 Encounter for surgical aftercare following surgery on the digestive system: Secondary | ICD-10-CM | POA: Diagnosis not present

## 2014-06-29 DIAGNOSIS — K7469 Other cirrhosis of liver: Secondary | ICD-10-CM | POA: Diagnosis not present

## 2014-06-29 DIAGNOSIS — E119 Type 2 diabetes mellitus without complications: Secondary | ICD-10-CM | POA: Diagnosis not present

## 2014-06-30 ENCOUNTER — Telehealth: Payer: Self-pay | Admitting: Hematology

## 2014-06-30 DIAGNOSIS — E119 Type 2 diabetes mellitus without complications: Secondary | ICD-10-CM | POA: Diagnosis not present

## 2014-06-30 DIAGNOSIS — Z48815 Encounter for surgical aftercare following surgery on the digestive system: Secondary | ICD-10-CM | POA: Diagnosis not present

## 2014-06-30 DIAGNOSIS — K7469 Other cirrhosis of liver: Secondary | ICD-10-CM | POA: Diagnosis not present

## 2014-06-30 NOTE — Telephone Encounter (Signed)
Confirm appointment for 02/08.

## 2014-07-01 DIAGNOSIS — E119 Type 2 diabetes mellitus without complications: Secondary | ICD-10-CM | POA: Diagnosis not present

## 2014-07-01 DIAGNOSIS — Z48815 Encounter for surgical aftercare following surgery on the digestive system: Secondary | ICD-10-CM | POA: Diagnosis not present

## 2014-07-01 DIAGNOSIS — K7469 Other cirrhosis of liver: Secondary | ICD-10-CM | POA: Diagnosis not present

## 2014-07-03 DIAGNOSIS — Z48815 Encounter for surgical aftercare following surgery on the digestive system: Secondary | ICD-10-CM | POA: Diagnosis not present

## 2014-07-03 DIAGNOSIS — E119 Type 2 diabetes mellitus without complications: Secondary | ICD-10-CM | POA: Diagnosis not present

## 2014-07-03 DIAGNOSIS — K7469 Other cirrhosis of liver: Secondary | ICD-10-CM | POA: Diagnosis not present

## 2014-07-05 ENCOUNTER — Ambulatory Visit: Payer: Medicare Other | Admitting: Hematology

## 2014-07-05 ENCOUNTER — Other Ambulatory Visit: Payer: Self-pay | Admitting: *Deleted

## 2014-07-05 ENCOUNTER — Telehealth: Payer: Self-pay | Admitting: Hematology

## 2014-07-05 ENCOUNTER — Other Ambulatory Visit: Payer: Medicare Other

## 2014-07-05 DIAGNOSIS — E119 Type 2 diabetes mellitus without complications: Secondary | ICD-10-CM | POA: Diagnosis not present

## 2014-07-05 DIAGNOSIS — K7469 Other cirrhosis of liver: Secondary | ICD-10-CM | POA: Diagnosis not present

## 2014-07-05 DIAGNOSIS — Z48815 Encounter for surgical aftercare following surgery on the digestive system: Secondary | ICD-10-CM | POA: Diagnosis not present

## 2014-07-05 NOTE — Telephone Encounter (Signed)
Patient confirm appointment for 02/10.

## 2014-07-06 DIAGNOSIS — K7469 Other cirrhosis of liver: Secondary | ICD-10-CM | POA: Diagnosis not present

## 2014-07-06 DIAGNOSIS — E119 Type 2 diabetes mellitus without complications: Secondary | ICD-10-CM | POA: Diagnosis not present

## 2014-07-06 DIAGNOSIS — Z48815 Encounter for surgical aftercare following surgery on the digestive system: Secondary | ICD-10-CM | POA: Diagnosis not present

## 2014-07-07 ENCOUNTER — Telehealth: Payer: Self-pay | Admitting: Hematology

## 2014-07-07 ENCOUNTER — Encounter: Payer: Self-pay | Admitting: Hematology

## 2014-07-07 ENCOUNTER — Ambulatory Visit (HOSPITAL_BASED_OUTPATIENT_CLINIC_OR_DEPARTMENT_OTHER): Payer: Medicare Other | Admitting: Hematology

## 2014-07-07 ENCOUNTER — Other Ambulatory Visit (HOSPITAL_BASED_OUTPATIENT_CLINIC_OR_DEPARTMENT_OTHER): Payer: Medicare Other

## 2014-07-07 VITALS — BP 132/89 | HR 102 | Temp 98.4°F | Resp 17 | Ht 63.0 in | Wt 154.9 lb

## 2014-07-07 DIAGNOSIS — Z48815 Encounter for surgical aftercare following surgery on the digestive system: Secondary | ICD-10-CM | POA: Diagnosis not present

## 2014-07-07 DIAGNOSIS — K7469 Other cirrhosis of liver: Secondary | ICD-10-CM | POA: Diagnosis not present

## 2014-07-07 DIAGNOSIS — D500 Iron deficiency anemia secondary to blood loss (chronic): Secondary | ICD-10-CM

## 2014-07-07 DIAGNOSIS — B182 Chronic viral hepatitis C: Secondary | ICD-10-CM | POA: Diagnosis not present

## 2014-07-07 DIAGNOSIS — R740 Nonspecific elevation of levels of transaminase and lactic acid dehydrogenase [LDH]: Secondary | ICD-10-CM | POA: Diagnosis not present

## 2014-07-07 DIAGNOSIS — E119 Type 2 diabetes mellitus without complications: Secondary | ICD-10-CM

## 2014-07-07 LAB — IRON AND TIBC CHCC
%SAT: 18 % — ABNORMAL LOW (ref 21–57)
Iron: 54 ug/dL (ref 41–142)
TIBC: 300 ug/dL (ref 236–444)
UIBC: 246 ug/dL (ref 120–384)

## 2014-07-07 LAB — CBC & DIFF AND RETIC
BASO%: 0.2 % (ref 0.0–2.0)
BASOS ABS: 0 10*3/uL (ref 0.0–0.1)
EOS%: 2.3 % (ref 0.0–7.0)
Eosinophils Absolute: 0.3 10*3/uL (ref 0.0–0.5)
HCT: 38.8 % (ref 34.8–46.6)
HEMOGLOBIN: 12.4 g/dL (ref 11.6–15.9)
Immature Retic Fract: 4.2 % (ref 1.60–10.00)
LYMPH%: 26.9 % (ref 14.0–49.7)
MCH: 28.6 pg (ref 25.1–34.0)
MCHC: 32 g/dL (ref 31.5–36.0)
MCV: 89.6 fL (ref 79.5–101.0)
MONO#: 1.9 10*3/uL — AB (ref 0.1–0.9)
MONO%: 14.1 % — AB (ref 0.0–14.0)
NEUT%: 56.5 % (ref 38.4–76.8)
NEUTROS ABS: 7.5 10*3/uL — AB (ref 1.5–6.5)
PLATELETS: 388 10*3/uL (ref 145–400)
RBC: 4.33 10*6/uL (ref 3.70–5.45)
RDW: 15.9 % — AB (ref 11.2–14.5)
Retic %: 1.92 % (ref 0.70–2.10)
Retic Ct Abs: 83.14 10*3/uL (ref 33.70–90.70)
WBC: 13.2 10*3/uL — ABNORMAL HIGH (ref 3.9–10.3)
lymph#: 3.6 10*3/uL — ABNORMAL HIGH (ref 0.9–3.3)

## 2014-07-07 LAB — COMPREHENSIVE METABOLIC PANEL (CC13)
ALT: 47 U/L (ref 0–55)
ANION GAP: 15 meq/L — AB (ref 3–11)
AST: 87 U/L — ABNORMAL HIGH (ref 5–34)
Albumin: 3.1 g/dL — ABNORMAL LOW (ref 3.5–5.0)
Alkaline Phosphatase: 72 U/L (ref 40–150)
BILIRUBIN TOTAL: 0.46 mg/dL (ref 0.20–1.20)
BUN: 21.6 mg/dL (ref 7.0–26.0)
CALCIUM: 7.2 mg/dL — AB (ref 8.4–10.4)
CHLORIDE: 99 meq/L (ref 98–109)
CO2: 27 meq/L (ref 22–29)
Creatinine: 1.2 mg/dL — ABNORMAL HIGH (ref 0.6–1.1)
EGFR: 55 mL/min/{1.73_m2} — ABNORMAL LOW (ref >90)
GLUCOSE: 138 mg/dL (ref 70–140)
Potassium: 3.6 mEq/L (ref 3.5–5.1)
SODIUM: 142 meq/L (ref 136–145)
TOTAL PROTEIN: 7.7 g/dL (ref 6.4–8.3)

## 2014-07-07 LAB — FERRITIN CHCC: Ferritin: 101 ng/ml (ref 9–269)

## 2014-07-07 NOTE — Telephone Encounter (Signed)
gv adn printed appt sched and avs for pt for April  °

## 2014-07-07 NOTE — Progress Notes (Signed)
[place] OFFICE PROGRESS NOTE   Patricia Gallant, MD [address] [place] [address]  DIAGNOSIS: No diagnosis found.. Her last visit was with Dr [name] on 01/15/2014.  PROBLEM LIST:  1. Recurrent iron-deficiency anemia secondary to gastrointestinal bleeding (as noted above). [name] also has required red cell transfusions in the past, most recently early December 2012. She underwent a small bowel enteroscopy by Dr. Dan [name] at [facility] on 08/13/2011. The enteroscope was passed up to 6 feet into the jejunum. He saw multiple AVMs, which were ablated. Two of the large AVMs bled during APC. These were in the 4th part of the duodenum very close to the ligament of Treitz. The remainder of the AVMs were in the proximal jejunum. In addition to the small bowel AV malformations, which were treated with APC, he saw some small esophageal varices which were not bleeding and also evidence of mild portal hypertensive gastropathy.  2. History of duodenal arteriovenous malformation.  3. History of hepatitis C infection.  4. Gastroesophageal reflux disease.  5. Depression.  6. Hypertension.  7. History of right Bell's palsy.  8. Status post left above-knee amputation secondary to necrotizing fasciitis 03/06/2009.  9. Neurodermatitis.  10. Previous history of alcohol usage.  11. Admission to the hospital from 12/20/2011 through 12/25/2011 for hematochezia felt to be secondary to a diverticular bleed, requiring 2 units of packed red cells.  12. Diverticulosis.  13. Elevated alpha fetoprotein, 72.3 on 11/19/2011 and 76.0 on 01/11/2012.  14. Abnormal MRI of the liver from 11/28/2011 showing diffuse and markedly low signal intensity throughout the liver as well as diffuse low signal intensity in the adrenal glands. 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 There were no findings for cirrhosis, portal  hypertension, splenomegaly or ascites, not were there any worrisome enhancing liver lesions.  15. Systolic ejection murmur.   CHIEF COMPLAIN: Follow up anemia  PRIOR TREATMENT: The patient has been heavily dependent upon IV Feraheme. Over the past year or so she has been receiving about 1 Feraheme infusion monthly on average. The patient's most recent infusions of IV Feraheme 510 mg occurred on the following dates: 06/05/2011, 07/03/2011, 07/31/2011, 08/17/2011, 08/28/2011. The patient received IV Feraheme 1020 mg on 10/02/2011 01/30/2012, 06/06/2012 and 09/10/2012, 05/2013, 07/2013, 08/2013, 09/2013, 11/12/2013, 01/15/2014 and 03/16/2014   CURRENT TREATMENT: As noted above. Feraheme 1020 mg prn.   INTERVAL HISTORY:  [name] 64 y.o. female with history of recurrent iron-deficiency anemia due to GI bleeding is here for follow-up. GI bleeding is probably due to AV malformations and diverticulosis.  We have been following her CBC and ferritin on a monthly basis except for the time when she was hospitalized. Since August 2013, the patient required IV Feraheme as noted above.  She had abdominal hernia repairment on 06/01/14. She tolerated the surgery well, but she has had surgical wound infection, which required debriment last week and she is still on antibiotics now. She got blood transfusion after the surgery. She lives at home, and has a care giver.    MEDICAL HISTORY: Past Medical History  Diagnosis Date  . Hypertension   . Colon polyps   . Stomach problems   . Hypokalemic alkalosis   . Allergic rhinitis   . GERD (gastroesophageal reflux disease)   . Stroke 2010    no residual problems  . Anemia, iron deficiency 05/03/2011    recieves iron infusions  . Anemia   .  GI bleeding   . Diverticulitis   . Transfusion history   . Difficulty sleeping     takes trazadone for sleep  . Peripheral vascular disease   . Hepatitis     hep C  . Anxiety   . Shortness of breath     due to anemia       ALLERGIES:  is allergic to ciprofloxacin; morphine and related; penicillins; codeine; and feraheme.  MEDICATIONS: has a current medication list which includes the following prescription(s): accu-chek fastclix lancets, alrex, aspirin ec, atenolol, cetirizine, cyclobenzaprine, doxycycline, doxycycline, esomeprazole, fluorometholone, fluoxetine, gabapentin, glucose blood, hydrochlorothiazide, lactose free nutrition, lisinopril, metformin, oxycodone-acetaminophen, oxycodone-acetaminophen, oxycodone-acetaminophen, potassium chloride, prednisolone acetate, magnesium (amino acid chelate), tramadol, trazodone, and vitamin c.  SURGICAL HISTORY:  Past Surgical History  Procedure Laterality Date  . Leg amputation above knee    . Esophagogastroduodenoscopy  05/05/2011    Procedure: ESOPHAGOGASTRODUODENOSCOPY (EGD);  Surgeon: Patricia Jarred, MD;  [name];  Service: Gastroenterology;  Laterality: N/A;  Dr. Hilarie [name] will do procedure for Dr. Benson [name] Saturday.  . Esophagogastroduodenoscopy  05/08/2011    Procedure: ESOPHAGOGASTRODUODENOSCOPY (EGD);  Surgeon: [name];  Location: [facility];  Service: Endoscopy;  Laterality: N/A;  . Esophagogastroduodenoscopy  06/07/2011    Procedure: ESOPHAGOGASTRODUODENOSCOPY (EGD);  Surgeon: Patricia Beams, MD;  [name];  Service: Endoscopy;  Laterality: N/A;  . Hot hemostasis  06/07/2011    Procedure: HOT HEMOSTASIS (ARGON PLASMA COAGULATION/BICAP);  Surgeon: Patricia Beams, MD;  [name];  Service: Endoscopy;  Laterality: N/A;  . Esophagogastroduodenoscopy  12/20/2011    Procedure: ESOPHAGOGASTRODUODENOSCOPY (EGD);  Surgeon: Patricia Beams, MD;  [name];  Service: Endoscopy;  Laterality: N/A;  . Flexible sigmoidoscopy  12/21/2011    Procedure: FLEXIBLE SIGMOIDOSCOPY;  Surgeon: Patricia Beams, MD;  Location: [facility];  Service: Endoscopy;  Laterality: N/A;  . Carpal tunnel release      rt hand  . Partial  colectomy  02/15/2012    Procedure: PARTIAL COLECTOMY;  Surgeon: Patricia Bowie, MD;  Location: [facility];  Service: General;  Laterality: N/A;  . Laparotomy  02/16/2012    Procedure: EXPLORATORY LAPAROTOMY;  Surgeon: Edward Jolly, MD;  Location: [facility];  Service: General;  Laterality: N/A;  oversewing of anastomotic leak and rigid sigmoidoscopy  . Enteroscopy N/A 12/04/2012    Procedure: ENTEROSCOPY;  Surgeon: Patricia Beams, MD;  Location: [facility];  Service: Endoscopy;  Laterality: N/A;  . Enteroscopy N/A 12/18/2012    Procedure: ENTEROSCOPY;  Surgeon: Patricia Beams, MD;  Location: [facility];  Service: Endoscopy;  Laterality: N/A;  . Eye surgery Bilateral     cataracts  . Tonsillectomy    . Abdominal hysterectomy    . Insertion of mesh N/A 06/01/2014    Procedure: INSERTION OF MESH;  Surgeon: Coralie Keens, MD;  Location: [place];  Service: General;  Laterality: N/A;  . Incisional hernia repair N/A 06/01/2014    Procedure: ATTEMPTED LAPAROSCOPIC AND OPEN INCISIONAL HERNIA REPAIR WITH MESH;  Surgeon: Coralie Keens, MD;  Location: [place];  Service: General;  Laterality: N/A;  . Laparotomy N/A 06/21/2014    Procedure: ABDOMINAL WOUND EXPLORATION;  Surgeon: Coralie Keens, MD;  Location: [place];  Service: General;  Laterality: N/A;  . Application of wound vac N/A 06/21/2014    Procedure: APPLICATION OF WOUND VAC;  Surgeon: Coralie Keens, MD;  Location: [place];  Service: General;  Laterality: N/A;    REVIEW OF SYSTEMS:   Constitutional: Denies  fevers, chills or abnormal weight loss; Reports mild fatigue.  Eyes: Denies blurriness of vision Ears, nose, mouth, throat, and face: Denies mucositis or sore throat Respiratory: Denies cough, dyspnea or wheezes Cardiovascular: Denies palpitation, chest discomfort or lower extremity swelling Gastrointestinal:  Denies nausea, heartburn or change in bowel habits, hernia is painful and bleeds sometime the overlying skin. Skin: Denies  abnormal skin rashes Lymphatics: Denies new lymphadenopathy or easy bruising Neurological:Denies numbness, tingling or new weaknesses Behavioral/Psych: Mood is stable, no new changes  All other systems were reviewed with the patient and are negative.  PHYSICAL EXAMINATION: ECOG PERFORMANCE STATUS: 2-3  Blood pressure 132/89, pulse 102, temperature 98.4 F (36.9 C), temperature source Oral, resp. rate 17, height 5\' 3"  (1.6 m), weight 154 lb 14.4 oz (70.262 kg), SpO2 98 %.  General appearance: alert, appears stated age, no distress and moderately obese  Head: Normocephalic, without obvious abnormality, atraumatic  Neck: no adenopathy, supple, symmetrical, trachea midline and thyroid not enlarged, symmetric, no tenderness/mass/nodules  Lymph nodes: Cervical adenopathy: None appreciated  Heart:regular rate and rhythm, S1, S2 normal and systolic murmur: systolic ejection 2/6, harsh and previously documented without radiation  Lung:chest clear, no wheezing, rales, normal symmetric air entry, Heart exam - S1, S2 normal, no murmur, no gallop, rate regular  Abdomin: soft, distended, normal bowel sounds, nontender, (+) abdominal surgical wound is covered and has a wound vacuum.   EXT:No peripheral edema on right extremity; Left above the knee amputation. She is wearing a prothesis.   Labs:  CBC Latest Ref Rng 07/07/2014 06/19/2014 06/18/2014  WBC 3.9 - 10.3 10e3/uL 13.2(H) 8.4 7.7  Hemoglobin 11.6 - 15.9 g/dL 12.4 9.7(L) 9.8(L)  Hematocrit 34.8 - 46.6 % 38.8 30.3(L) 31.0(L)  Platelets 145 - 400 10e3/uL 388 455(H) 474(H)    CMP Latest Ref Rng 07/07/2014 06/19/2014 06/18/2014  Glucose 70 - 140 mg/dl 138 122(H) 146(H)  BUN 7.0 - 26.0 mg/dL 21.6 14 11   Creatinine 0.6 - 1.1 mg/dL 1.2(H) 0.98 0.81  Sodium 136 - 145 mEq/L 142 133(L) 136  Potassium 3.5 - 5.1 mEq/L 3.6 3.3(L) 3.5  Chloride 96 - 112 mmol/L - 95(L) 95(L)  CO2 22 - 29 mEq/L 27 26 30   Calcium 8.4 - 10.4 mg/dL 7.2(L) 7.6(L) 7.6(L)  Total  Protein 6.4 - 8.3 g/dL 7.7 6.1 -  Total Bilirubin 0.20 - 1.20 mg/dL 0.46 0.5 -  Alkaline Phos 40 - 150 U/L 72 57 -  AST 5 - 34 U/L 87(H) 35 -  ALT 0 - 55 U/L 47 29 -   Ferritin  Status: Finalresult Visible to patient:  Not Released Nextappt: 06/28/2014 at 03:30 PM in Oncology ([facility] ONLY) Dx:  Iron deficiency anemia due to chronic...           Ref Range 3wk ago  12mo ago  62mo ago  53mo ago     Ferritin 9 - 269 ng/ml 230 15 417 (H) 11      Iron and TIBC [facility]  Status: Finalresult Visible to patient:  Not Released Nextappt: 06/28/2014 at 03:30 PM in Oncology ([facility] ONLY) Dx:  Iron deficiency anemia due to chronic...           Ref Range 3wk ago  52mo ago  8mo ago  26mo ago     Iron 41 - 142 ug/dL 105 15 (L) 73 21 (L)    TIBC 236 - 444 ug/dL 349 479 (H) 368 538 (H)    UIBC 120 - 384 ug/dL 243 464 (  H) 298 517 (H)    %SAT 21 - 57 % 30 3 (L) 19 (L) 4 (L)        RADIOGRAPHIC STUDIES: Mm Digital Screening Bilateral  07/31/2013   CLINICAL DATA:  Screening.  EXAM: DIGITAL SCREENING BILATERAL MAMMOGRAM WITH CAD  COMPARISON:  Previous exam(s).  ACR Breast Density Category c: The breast tissue is heterogeneously dense, which may obscure small masses.  FINDINGS: There are no findings suspicious for malignancy. Images were processed with CAD.  IMPRESSION: No mammographic evidence of malignancy. A result letter of this screening mammogram will be mailed directly to the patient.  RECOMMENDATION: Screening mammogram in one year. (Code:SM-B-01Y)  BI-RADS CATEGORY  1: Negative.   Electronically Signed   By: Abelardo Diesel M.D.   On: 07/31/2013 09:01   ASSESSMENT: [name] 64 y.o. female with a history of No diagnosis found.   PLAN:  1. Iron deficiency anemia secondary to GI lost.  - her hemoglobin is 12.4 today, she received blood transfusion after surgery.  -Her iron study and ferritin result is still pending today -I will see her  back in 2 month with repeated lab   2.abdominal wound infection -she will continue follow up with wound clinic  3. Elevated transiminases, likely secondary to her chronic hepatitis C  -She was tested positive for hepatitis C in 2009. She has not been treated for hepatitis C, and does not follow with hepatologist. -follow up   4. DM2. --Management per PCP's office.   Plan  -Follow up with your wound clinic  -RTC in 2 month with lab   All questions were answered. The patient knows to call the clinic with any problems, questions or concerns. We can certainly see the patient much sooner if necessary.  I spent 20 minutes counseling the patient face to face. The total time spent in the appointment was 25 minutes.  Truitt Merle 07/07/2014

## 2014-07-08 DIAGNOSIS — K7469 Other cirrhosis of liver: Secondary | ICD-10-CM | POA: Diagnosis not present

## 2014-07-08 DIAGNOSIS — Z48815 Encounter for surgical aftercare following surgery on the digestive system: Secondary | ICD-10-CM | POA: Diagnosis not present

## 2014-07-08 DIAGNOSIS — E119 Type 2 diabetes mellitus without complications: Secondary | ICD-10-CM | POA: Diagnosis not present

## 2014-07-08 LAB — HEPATITIS C RNA QUANTITATIVE
HCV Quantitative Log: 6.2 {Log} — ABNORMAL HIGH (ref <1.18)
HCV Quantitative: 1596798 IU/mL — ABNORMAL HIGH (ref <15)

## 2014-07-09 DIAGNOSIS — E119 Type 2 diabetes mellitus without complications: Secondary | ICD-10-CM | POA: Diagnosis not present

## 2014-07-09 DIAGNOSIS — K7469 Other cirrhosis of liver: Secondary | ICD-10-CM | POA: Diagnosis not present

## 2014-07-09 DIAGNOSIS — Z48815 Encounter for surgical aftercare following surgery on the digestive system: Secondary | ICD-10-CM | POA: Diagnosis not present

## 2014-07-12 DIAGNOSIS — K7469 Other cirrhosis of liver: Secondary | ICD-10-CM | POA: Diagnosis not present

## 2014-07-12 DIAGNOSIS — Z48815 Encounter for surgical aftercare following surgery on the digestive system: Secondary | ICD-10-CM | POA: Diagnosis not present

## 2014-07-12 DIAGNOSIS — E119 Type 2 diabetes mellitus without complications: Secondary | ICD-10-CM | POA: Diagnosis not present

## 2014-07-13 DIAGNOSIS — Z48815 Encounter for surgical aftercare following surgery on the digestive system: Secondary | ICD-10-CM | POA: Diagnosis not present

## 2014-07-13 DIAGNOSIS — K7469 Other cirrhosis of liver: Secondary | ICD-10-CM | POA: Diagnosis not present

## 2014-07-13 DIAGNOSIS — E119 Type 2 diabetes mellitus without complications: Secondary | ICD-10-CM | POA: Diagnosis not present

## 2014-07-14 DIAGNOSIS — E119 Type 2 diabetes mellitus without complications: Secondary | ICD-10-CM | POA: Diagnosis not present

## 2014-07-14 DIAGNOSIS — Z48815 Encounter for surgical aftercare following surgery on the digestive system: Secondary | ICD-10-CM | POA: Diagnosis not present

## 2014-07-14 DIAGNOSIS — K7469 Other cirrhosis of liver: Secondary | ICD-10-CM | POA: Diagnosis not present

## 2014-07-15 ENCOUNTER — Other Ambulatory Visit: Payer: Self-pay | Admitting: Internal Medicine

## 2014-07-15 DIAGNOSIS — K7469 Other cirrhosis of liver: Secondary | ICD-10-CM | POA: Diagnosis not present

## 2014-07-15 DIAGNOSIS — Z48815 Encounter for surgical aftercare following surgery on the digestive system: Secondary | ICD-10-CM | POA: Diagnosis not present

## 2014-07-15 DIAGNOSIS — E119 Type 2 diabetes mellitus without complications: Secondary | ICD-10-CM | POA: Diagnosis not present

## 2014-07-16 DIAGNOSIS — Z48815 Encounter for surgical aftercare following surgery on the digestive system: Secondary | ICD-10-CM | POA: Diagnosis not present

## 2014-07-16 DIAGNOSIS — E119 Type 2 diabetes mellitus without complications: Secondary | ICD-10-CM | POA: Diagnosis not present

## 2014-07-16 DIAGNOSIS — K7469 Other cirrhosis of liver: Secondary | ICD-10-CM | POA: Diagnosis not present

## 2014-07-19 DIAGNOSIS — K7469 Other cirrhosis of liver: Secondary | ICD-10-CM | POA: Diagnosis not present

## 2014-07-19 DIAGNOSIS — E119 Type 2 diabetes mellitus without complications: Secondary | ICD-10-CM | POA: Diagnosis not present

## 2014-07-19 DIAGNOSIS — Z48815 Encounter for surgical aftercare following surgery on the digestive system: Secondary | ICD-10-CM | POA: Diagnosis not present

## 2014-07-20 DIAGNOSIS — E119 Type 2 diabetes mellitus without complications: Secondary | ICD-10-CM | POA: Diagnosis not present

## 2014-07-20 DIAGNOSIS — K7469 Other cirrhosis of liver: Secondary | ICD-10-CM | POA: Diagnosis not present

## 2014-07-20 DIAGNOSIS — Z48815 Encounter for surgical aftercare following surgery on the digestive system: Secondary | ICD-10-CM | POA: Diagnosis not present

## 2014-07-21 DIAGNOSIS — Z48815 Encounter for surgical aftercare following surgery on the digestive system: Secondary | ICD-10-CM | POA: Diagnosis not present

## 2014-07-21 DIAGNOSIS — E119 Type 2 diabetes mellitus without complications: Secondary | ICD-10-CM | POA: Diagnosis not present

## 2014-07-21 DIAGNOSIS — K7469 Other cirrhosis of liver: Secondary | ICD-10-CM | POA: Diagnosis not present

## 2014-07-22 DIAGNOSIS — K7469 Other cirrhosis of liver: Secondary | ICD-10-CM | POA: Diagnosis not present

## 2014-07-22 DIAGNOSIS — Z48815 Encounter for surgical aftercare following surgery on the digestive system: Secondary | ICD-10-CM | POA: Diagnosis not present

## 2014-07-22 DIAGNOSIS — E119 Type 2 diabetes mellitus without complications: Secondary | ICD-10-CM | POA: Diagnosis not present

## 2014-07-23 DIAGNOSIS — Z48815 Encounter for surgical aftercare following surgery on the digestive system: Secondary | ICD-10-CM | POA: Diagnosis not present

## 2014-07-23 DIAGNOSIS — E119 Type 2 diabetes mellitus without complications: Secondary | ICD-10-CM | POA: Diagnosis not present

## 2014-07-23 DIAGNOSIS — K7469 Other cirrhosis of liver: Secondary | ICD-10-CM | POA: Diagnosis not present

## 2014-07-26 DIAGNOSIS — E119 Type 2 diabetes mellitus without complications: Secondary | ICD-10-CM | POA: Diagnosis not present

## 2014-07-26 DIAGNOSIS — Z48815 Encounter for surgical aftercare following surgery on the digestive system: Secondary | ICD-10-CM | POA: Diagnosis not present

## 2014-07-26 DIAGNOSIS — K7469 Other cirrhosis of liver: Secondary | ICD-10-CM | POA: Diagnosis not present

## 2014-07-27 DIAGNOSIS — E119 Type 2 diabetes mellitus without complications: Secondary | ICD-10-CM | POA: Diagnosis not present

## 2014-07-27 DIAGNOSIS — Z48815 Encounter for surgical aftercare following surgery on the digestive system: Secondary | ICD-10-CM | POA: Diagnosis not present

## 2014-07-27 DIAGNOSIS — K7469 Other cirrhosis of liver: Secondary | ICD-10-CM | POA: Diagnosis not present

## 2014-07-28 DIAGNOSIS — E119 Type 2 diabetes mellitus without complications: Secondary | ICD-10-CM | POA: Diagnosis not present

## 2014-07-28 DIAGNOSIS — Z48815 Encounter for surgical aftercare following surgery on the digestive system: Secondary | ICD-10-CM | POA: Diagnosis not present

## 2014-07-28 DIAGNOSIS — K7469 Other cirrhosis of liver: Secondary | ICD-10-CM | POA: Diagnosis not present

## 2014-07-29 DIAGNOSIS — Z48815 Encounter for surgical aftercare following surgery on the digestive system: Secondary | ICD-10-CM | POA: Diagnosis not present

## 2014-07-29 DIAGNOSIS — E119 Type 2 diabetes mellitus without complications: Secondary | ICD-10-CM | POA: Diagnosis not present

## 2014-07-29 DIAGNOSIS — K7469 Other cirrhosis of liver: Secondary | ICD-10-CM | POA: Diagnosis not present

## 2014-07-30 DIAGNOSIS — E119 Type 2 diabetes mellitus without complications: Secondary | ICD-10-CM | POA: Diagnosis not present

## 2014-07-30 DIAGNOSIS — K7469 Other cirrhosis of liver: Secondary | ICD-10-CM | POA: Diagnosis not present

## 2014-07-30 DIAGNOSIS — Z48815 Encounter for surgical aftercare following surgery on the digestive system: Secondary | ICD-10-CM | POA: Diagnosis not present

## 2014-08-02 ENCOUNTER — Encounter: Payer: Self-pay | Admitting: *Deleted

## 2014-08-02 DIAGNOSIS — E119 Type 2 diabetes mellitus without complications: Secondary | ICD-10-CM | POA: Diagnosis not present

## 2014-08-02 DIAGNOSIS — Z48815 Encounter for surgical aftercare following surgery on the digestive system: Secondary | ICD-10-CM | POA: Diagnosis not present

## 2014-08-02 DIAGNOSIS — K7469 Other cirrhosis of liver: Secondary | ICD-10-CM | POA: Diagnosis not present

## 2014-08-03 DIAGNOSIS — Z48815 Encounter for surgical aftercare following surgery on the digestive system: Secondary | ICD-10-CM | POA: Diagnosis not present

## 2014-08-03 DIAGNOSIS — K7469 Other cirrhosis of liver: Secondary | ICD-10-CM | POA: Diagnosis not present

## 2014-08-03 DIAGNOSIS — E119 Type 2 diabetes mellitus without complications: Secondary | ICD-10-CM | POA: Diagnosis not present

## 2014-08-04 DIAGNOSIS — E119 Type 2 diabetes mellitus without complications: Secondary | ICD-10-CM | POA: Diagnosis not present

## 2014-08-04 DIAGNOSIS — Z48815 Encounter for surgical aftercare following surgery on the digestive system: Secondary | ICD-10-CM | POA: Diagnosis not present

## 2014-08-04 DIAGNOSIS — K7469 Other cirrhosis of liver: Secondary | ICD-10-CM | POA: Diagnosis not present

## 2014-08-05 DIAGNOSIS — E119 Type 2 diabetes mellitus without complications: Secondary | ICD-10-CM | POA: Diagnosis not present

## 2014-08-05 DIAGNOSIS — Z48815 Encounter for surgical aftercare following surgery on the digestive system: Secondary | ICD-10-CM | POA: Diagnosis not present

## 2014-08-05 DIAGNOSIS — K7469 Other cirrhosis of liver: Secondary | ICD-10-CM | POA: Diagnosis not present

## 2014-08-06 DIAGNOSIS — K7469 Other cirrhosis of liver: Secondary | ICD-10-CM | POA: Diagnosis not present

## 2014-08-06 DIAGNOSIS — Z48815 Encounter for surgical aftercare following surgery on the digestive system: Secondary | ICD-10-CM | POA: Diagnosis not present

## 2014-08-06 DIAGNOSIS — E119 Type 2 diabetes mellitus without complications: Secondary | ICD-10-CM | POA: Diagnosis not present

## 2014-08-08 DIAGNOSIS — K7469 Other cirrhosis of liver: Secondary | ICD-10-CM | POA: Diagnosis not present

## 2014-08-08 DIAGNOSIS — E119 Type 2 diabetes mellitus without complications: Secondary | ICD-10-CM | POA: Diagnosis not present

## 2014-08-08 DIAGNOSIS — T8131XD Disruption of external operation (surgical) wound, not elsewhere classified, subsequent encounter: Secondary | ICD-10-CM | POA: Diagnosis not present

## 2014-08-09 ENCOUNTER — Other Ambulatory Visit (INDEPENDENT_AMBULATORY_CARE_PROVIDER_SITE_OTHER): Payer: Self-pay

## 2014-08-09 DIAGNOSIS — K7469 Other cirrhosis of liver: Secondary | ICD-10-CM | POA: Diagnosis not present

## 2014-08-09 DIAGNOSIS — E119 Type 2 diabetes mellitus without complications: Secondary | ICD-10-CM | POA: Diagnosis not present

## 2014-08-09 DIAGNOSIS — R1084 Generalized abdominal pain: Secondary | ICD-10-CM

## 2014-08-09 DIAGNOSIS — T8131XD Disruption of external operation (surgical) wound, not elsewhere classified, subsequent encounter: Secondary | ICD-10-CM | POA: Diagnosis not present

## 2014-08-09 NOTE — Addendum Note (Signed)
Addended by: Coralie Keens A on: 08/09/2014 12:18 PM   Modules accepted: Orders

## 2014-08-10 ENCOUNTER — Other Ambulatory Visit (INDEPENDENT_AMBULATORY_CARE_PROVIDER_SITE_OTHER): Payer: Self-pay

## 2014-08-10 DIAGNOSIS — R1084 Generalized abdominal pain: Secondary | ICD-10-CM

## 2014-08-10 DIAGNOSIS — E119 Type 2 diabetes mellitus without complications: Secondary | ICD-10-CM | POA: Diagnosis not present

## 2014-08-10 DIAGNOSIS — T8131XD Disruption of external operation (surgical) wound, not elsewhere classified, subsequent encounter: Secondary | ICD-10-CM | POA: Diagnosis not present

## 2014-08-10 DIAGNOSIS — K7469 Other cirrhosis of liver: Secondary | ICD-10-CM | POA: Diagnosis not present

## 2014-08-11 DIAGNOSIS — E119 Type 2 diabetes mellitus without complications: Secondary | ICD-10-CM | POA: Diagnosis not present

## 2014-08-11 DIAGNOSIS — K7469 Other cirrhosis of liver: Secondary | ICD-10-CM | POA: Diagnosis not present

## 2014-08-11 DIAGNOSIS — T8131XD Disruption of external operation (surgical) wound, not elsewhere classified, subsequent encounter: Secondary | ICD-10-CM | POA: Diagnosis not present

## 2014-08-12 ENCOUNTER — Ambulatory Visit
Admission: RE | Admit: 2014-08-12 | Discharge: 2014-08-12 | Disposition: A | Discharge status: Home / Self Care | Payer: Medicare Other | Source: Ambulatory Visit | Attending: Surgery | Admitting: Surgery

## 2014-08-12 DIAGNOSIS — S31109A Unspecified open wound of abdominal wall, unspecified quadrant without penetration into peritoneal cavity, initial encounter: Secondary | ICD-10-CM | POA: Diagnosis not present

## 2014-08-12 DIAGNOSIS — R109 Unspecified abdominal pain: Secondary | ICD-10-CM | POA: Diagnosis not present

## 2014-08-12 MED ORDER — IOPAMIDOL (ISOVUE-300) INJECTION 61%
100.0000 mL | Freq: Once | INTRAVENOUS | Status: AC | PRN
  Administered 2014-08-12: 100 mL via INTRAVENOUS

## 2014-08-13 ENCOUNTER — Ambulatory Visit (INDEPENDENT_AMBULATORY_CARE_PROVIDER_SITE_OTHER): Payer: Medicare Other | Admitting: Internal Medicine

## 2014-08-13 ENCOUNTER — Encounter: Payer: Self-pay | Admitting: Internal Medicine

## 2014-08-13 VITALS — BP 149/80 | HR 81 | Temp 97.9°F | Ht 63.0 in | Wt 168.7 lb

## 2014-08-13 DIAGNOSIS — T8131XD Disruption of external operation (surgical) wound, not elsewhere classified, subsequent encounter: Secondary | ICD-10-CM | POA: Diagnosis not present

## 2014-08-13 DIAGNOSIS — K439 Ventral hernia without obstruction or gangrene: Secondary | ICD-10-CM | POA: Diagnosis not present

## 2014-08-13 DIAGNOSIS — K7469 Other cirrhosis of liver: Secondary | ICD-10-CM | POA: Diagnosis not present

## 2014-08-13 DIAGNOSIS — E119 Type 2 diabetes mellitus without complications: Secondary | ICD-10-CM | POA: Diagnosis not present

## 2014-08-13 DIAGNOSIS — T8189XS Other complications of procedures, not elsewhere classified, sequela: Secondary | ICD-10-CM | POA: Diagnosis not present

## 2014-08-13 LAB — POCT GLYCOSYLATED HEMOGLOBIN (HGB A1C): Hemoglobin A1C: 5.6

## 2014-08-13 LAB — GLUCOSE, CAPILLARY: Glucose-Capillary: 122 mg/dL — ABNORMAL HIGH (ref 70–99)

## 2014-08-13 MED ORDER — OXYCODONE-ACETAMINOPHEN 10-325 MG PO TABS
1.0000 | ORAL_TABLET | Freq: Four times a day (QID) | ORAL | Status: DC | PRN
Start: 2014-08-13 — End: 2014-09-22

## 2014-08-13 NOTE — Progress Notes (Signed)
Subjective:   Patient ID: [name] female   DOB: 04-Jan-1951 64 y.o.   MRN: [id number]  HPI: [name] is a 64 y.o. woman complicated pmh as listed below presents for HFU.   Pt was hospitalized most of February for ventral hernia repair complicated by wound dehiscence that required debridement and with a wound vac placement. She was also treated for a wound infection at that time and has currently completed her antibiotics. The patient has been without any pain medication since the end of January and has been taking some Tylenol with little to no relief. She says that she's had slight return of her appetite. She went for CT abdomen and pelvis on 08/12/14 and therefore has not been taking her diabetes medication.   Past Medical History  Diagnosis Date  . Hypertension   . Colon polyps   . Stomach problems   . Hypokalemic alkalosis   . Allergic rhinitis   . GERD (gastroesophageal reflux disease)   . Stroke 2010    no residual problems  . Anemia, iron deficiency 05/03/2011    recieves iron infusions  . Anemia   . GI bleeding   . Diverticulitis   . Transfusion history   . Difficulty sleeping     takes trazadone for sleep  . Peripheral vascular disease   . Hepatitis     hep C  . Anxiety   . Shortness of breath     due to anemia   Current Outpatient Prescriptions  Medication Sig Dispense Refill  . ACCU-CHEK FASTCLIX LANCETS MISC Check your blood sugar one time a day 102 each 5  . ALREX 0.2 % SUSP     . aspirin EC 81 MG tablet Take 81 mg by mouth daily.     Marland Kitchen atenolol (TENORMIN) 50 MG tablet Take 50 mg by mouth every morning.     . cetirizine (ZYRTEC) 10 MG tablet Take 10 mg by mouth every morning.     . cyclobenzaprine (FLEXERIL) 10 MG tablet Take 1 tablet (10 mg total) by mouth 2 (two) times daily as needed for muscle spasms. 20 tablet 0  . doxycycline (VIBRA-TABS) 100 MG tablet Take 1 tablet (100 mg total) by mouth 2 (two) times daily. 20 tablet 0  . doxycycline  (VIBRAMYCIN) 100 MG capsule Take 100 mg by mouth daily. Last dose  07/09/14.    Marland Kitchen esomeprazole (NEXIUM) 40 MG capsule Take 40 mg by mouth 2 (two) times daily.    . fluorometholone (FML) 0.1 % ophthalmic suspension Place 1 drop into the left eye 2 (two) times daily.     Marland Kitchen FLUoxetine (PROZAC) 40 MG capsule TAKE 1 CAPSULE BY MOUTH DAILY 30 capsule 5  . gabapentin (NEURONTIN) 400 MG capsule Take 800 mg by mouth 3 (three) times daily.    Marland Kitchen gabapentin (NEURONTIN) 400 MG capsule TAKE 2 CAPSULE BY MOUTH THREE TIMES A DAY 180 capsule 2  . glucose blood (ACCU-CHEK SMARTVIEW) test strip Check blood sugar  one time a day 50 each 12  . hydrochlorothiazide (HYDRODIURIL) 25 MG tablet Take 25 mg by mouth every morning.    . hydrochlorothiazide (HYDRODIURIL) 25 MG tablet TAKE 1 TABLET BY MOUTH DAILY 30 tablet 11  . lactose free nutrition (BOOST PLUS) LIQD Take 237 mLs by mouth 3 (three) times daily with meals. 237 mL 0  . lisinopril (PRINIVIL,ZESTRIL) 40 MG tablet Take 1 tablet (40 mg total) by mouth every morning. 90 tablet 4  . metFORMIN (GLUCOPHAGE) 500  MG tablet Take 500 mg by mouth daily with breakfast.    . oxyCODONE-acetaminophen (PERCOCET) 10-325 MG per tablet Take 1 tablet by mouth every 6 (six) hours as needed for pain. 120 tablet 0  . oxyCODONE-acetaminophen (PERCOCET) 10-325 MG per tablet Take 1 tablet by mouth every 4 (four) hours as needed for pain. 60 tablet 0  . oxyCODONE-acetaminophen (PERCOCET/ROXICET) 5-325 MG per tablet Take 1-2 tablets by mouth every 4 (four) hours as needed for moderate pain or severe pain. 30 tablet 0  . potassium chloride 20 MEQ/15ML (10%) SOLN TAKE 15ML BY MOUTH EVERY DAY 450 mL 2  . prednisoLONE acetate (PRED FORTE) 1 % ophthalmic suspension Place 1 drop into the right eye daily.     Marland Kitchen Specialty Vitamins Products (MAGNESIUM, AMINO ACID CHELATE,) 133 MG tablet Take 1 tablet by mouth 2 (two) times daily.    . traMADol (ULTRAM) 50 MG tablet Take 1-2 tablets (50-100 mg total)  by mouth every 6 (six) hours as needed. (Patient taking differently: Take 50-100 mg by mouth every 6 (six) hours as needed. ) 30 tablet 1  . traZODone (DESYREL) 100 MG tablet Take 100 mg by mouth at bedtime.    . vitamin C (ASCORBIC ACID) 500 MG tablet Take 500 mg by mouth every morning.     No current facility-administered medications for this visit.   Family History  Problem Relation Age of Onset  . Cancer Father     unknown  . Hypertension Mother   . Diverticulitis Mother    History   Social History  . Marital Status: Widowed    Spouse Name: N/A  . Number of Children: N/A  . Years of Education: N/A   Social History Main Topics  . Smoking status: Current Every Day Smoker -- 0.20 packs/day for 17 years    Types: Cigarettes  . Smokeless tobacco: Never Used     Comment: wants to quit.  Smoking less 1 pk per week  . Alcohol Use: 0.0 oz/week    0 Standard drinks or equivalent per week     Comment: Occasionally  . Drug Use: Yes    Special: Marijuana, "Crack" cocaine     Comment: Thanksgiving and Christmas  Holidays 2014 hasn't had any since  . Sexual Activity: Not on file   Other Topics Concern  . None   Social History Narrative   Review of Systems: Pertinent items are noted in HPI. Objective:  Physical Exam: Filed Vitals:   08/13/14 1544  BP: 149/80  Pulse: 81  Temp: 97.9 F (36.6 C)  TempSrc: Oral  Height: 5\' 3"  (1.6 m)  Weight: 168 lb 11.2 oz (76.522 kg)  SpO2: 99%   General: sitting in wheelchair, NAD  HEENT: PERRL, EOMI, no scleral icterus Cardiac: RRR, no rubs, murmurs or gallops Pulm: clear to auscultation bilaterally, moving normal volumes of air Abd: soft, slightly diffusely tender to palpation, wound vac in place, nondistended, BS present Ext: warm and well perfused, no pedal edema, left bka  Assessment & Plan:  Please see problem oriented charting  Pt discussed with Dr. Lynnae January

## 2014-08-13 NOTE — Assessment & Plan Note (Signed)
>>  ASSESSMENT AND PLAN FOR TYPE 2 DIABETES MELLITUS WITH DIABETIC NEUROPATHY ([facility]) WRITTEN ON 08/13/2014  4:27 PM BY SADEK, ALTAMESE HERO, MD  Patient hemoglobin A1c is 5.6 this is likely an underrepresentation given her repeated transfusions recently. Patient's CBG readings are in the 120s. Will hold metformin  for the next 2 days even recent IV contrast. -Continue metformin  500 mg twice a day -Follow-up in 1-3 months

## 2014-08-13 NOTE — Assessment & Plan Note (Signed)
This has been repaired with some complication and is being managed by surgery at this time. Patient continues to have a wound VAC. -Refill for Percocet was provided at this visit

## 2014-08-13 NOTE — Patient Instructions (Signed)
General Instructions:   Thank you for bringing your medicines today. This helps [place] keep you safe from mistakes.   Progress Toward Treatment Goals:  Treatment Goal 05/14/2014  Blood pressure -  Stop smoking -  Prevent falls at goal    Self Care Goals & Plans:  Self Care Goal 05/14/2014  Manage my medications take my medicines as prescribed; bring my medications to every visit; refill my medications on time  Monitor my health -  Eat healthy foods drink diet soda or water instead of juice or soda; eat more vegetables; eat foods that are low in salt; eat baked foods instead of fried foods; eat fruit for snacks and desserts  Be physically active -  Stop smoking -  Prevent falls -    No flowsheet data found.   Care Management & Community Referrals:  Referral 05/14/2014  Referrals made for care management support [facility] case management program

## 2014-08-13 NOTE — Assessment & Plan Note (Signed)
Patient hemoglobin A1c is 5.6 this is likely an underrepresentation given her repeated transfusions recently. Patient's CBG readings are in the 120s. Will hold metformin for the next 2 days even recent IV contrast. -Continue metformin 500 mg twice a day -Follow-up in 1-3 months

## 2014-08-14 NOTE — Progress Notes (Signed)
[facility] Attending  Case discussed with Dr. Sadek soon after the resident saw the patient.  We reviewed the resident's history and exam and pertinent patient test results.  I agree with the assessment, diagnosis, and plan of care documented in the resident's note. 

## 2014-08-16 DIAGNOSIS — T8131XD Disruption of external operation (surgical) wound, not elsewhere classified, subsequent encounter: Secondary | ICD-10-CM | POA: Diagnosis not present

## 2014-08-16 DIAGNOSIS — K7469 Other cirrhosis of liver: Secondary | ICD-10-CM | POA: Diagnosis not present

## 2014-08-16 DIAGNOSIS — E119 Type 2 diabetes mellitus without complications: Secondary | ICD-10-CM | POA: Diagnosis not present

## 2014-08-18 DIAGNOSIS — K7469 Other cirrhosis of liver: Secondary | ICD-10-CM | POA: Diagnosis not present

## 2014-08-18 DIAGNOSIS — T8131XD Disruption of external operation (surgical) wound, not elsewhere classified, subsequent encounter: Secondary | ICD-10-CM | POA: Diagnosis not present

## 2014-08-18 DIAGNOSIS — E119 Type 2 diabetes mellitus without complications: Secondary | ICD-10-CM | POA: Diagnosis not present

## 2014-08-20 DIAGNOSIS — K7469 Other cirrhosis of liver: Secondary | ICD-10-CM | POA: Diagnosis not present

## 2014-08-20 DIAGNOSIS — T8131XD Disruption of external operation (surgical) wound, not elsewhere classified, subsequent encounter: Secondary | ICD-10-CM | POA: Diagnosis not present

## 2014-08-20 DIAGNOSIS — E119 Type 2 diabetes mellitus without complications: Secondary | ICD-10-CM | POA: Diagnosis not present

## 2014-08-23 DIAGNOSIS — T8131XD Disruption of external operation (surgical) wound, not elsewhere classified, subsequent encounter: Secondary | ICD-10-CM | POA: Diagnosis not present

## 2014-08-23 DIAGNOSIS — K7469 Other cirrhosis of liver: Secondary | ICD-10-CM | POA: Diagnosis not present

## 2014-08-23 DIAGNOSIS — E119 Type 2 diabetes mellitus without complications: Secondary | ICD-10-CM | POA: Diagnosis not present

## 2014-08-25 DIAGNOSIS — K7469 Other cirrhosis of liver: Secondary | ICD-10-CM | POA: Diagnosis not present

## 2014-08-25 DIAGNOSIS — E119 Type 2 diabetes mellitus without complications: Secondary | ICD-10-CM | POA: Diagnosis not present

## 2014-08-25 DIAGNOSIS — T8131XD Disruption of external operation (surgical) wound, not elsewhere classified, subsequent encounter: Secondary | ICD-10-CM | POA: Diagnosis not present

## 2014-08-27 DIAGNOSIS — K7469 Other cirrhosis of liver: Secondary | ICD-10-CM | POA: Diagnosis not present

## 2014-08-27 DIAGNOSIS — T8131XD Disruption of external operation (surgical) wound, not elsewhere classified, subsequent encounter: Secondary | ICD-10-CM | POA: Diagnosis not present

## 2014-08-27 DIAGNOSIS — E119 Type 2 diabetes mellitus without complications: Secondary | ICD-10-CM | POA: Diagnosis not present

## 2014-08-30 DIAGNOSIS — E119 Type 2 diabetes mellitus without complications: Secondary | ICD-10-CM | POA: Diagnosis not present

## 2014-08-30 DIAGNOSIS — K7469 Other cirrhosis of liver: Secondary | ICD-10-CM | POA: Diagnosis not present

## 2014-08-30 DIAGNOSIS — T8131XD Disruption of external operation (surgical) wound, not elsewhere classified, subsequent encounter: Secondary | ICD-10-CM | POA: Diagnosis not present

## 2014-09-01 ENCOUNTER — Ambulatory Visit: Payer: Medicare Other | Admitting: Hematology

## 2014-09-01 ENCOUNTER — Other Ambulatory Visit: Payer: Medicare Other

## 2014-09-01 DIAGNOSIS — E119 Type 2 diabetes mellitus without complications: Secondary | ICD-10-CM | POA: Diagnosis not present

## 2014-09-01 DIAGNOSIS — K7469 Other cirrhosis of liver: Secondary | ICD-10-CM | POA: Diagnosis not present

## 2014-09-01 DIAGNOSIS — T8131XD Disruption of external operation (surgical) wound, not elsewhere classified, subsequent encounter: Secondary | ICD-10-CM | POA: Diagnosis not present

## 2014-09-02 ENCOUNTER — Encounter: Payer: Self-pay | Admitting: Hematology

## 2014-09-02 ENCOUNTER — Ambulatory Visit (HOSPITAL_BASED_OUTPATIENT_CLINIC_OR_DEPARTMENT_OTHER): Payer: Medicare Other | Admitting: Hematology

## 2014-09-02 ENCOUNTER — Other Ambulatory Visit (HOSPITAL_BASED_OUTPATIENT_CLINIC_OR_DEPARTMENT_OTHER): Payer: Medicare Other

## 2014-09-02 ENCOUNTER — Other Ambulatory Visit: Payer: Self-pay | Admitting: *Deleted

## 2014-09-02 ENCOUNTER — Telehealth: Payer: Self-pay | Admitting: Hematology

## 2014-09-02 VITALS — BP 154/78 | HR 79 | Temp 98.6°F | Resp 18 | Ht 63.0 in | Wt 170.0 lb

## 2014-09-02 DIAGNOSIS — E119 Type 2 diabetes mellitus without complications: Secondary | ICD-10-CM

## 2014-09-02 DIAGNOSIS — E876 Hypokalemia: Secondary | ICD-10-CM | POA: Diagnosis not present

## 2014-09-02 DIAGNOSIS — B182 Chronic viral hepatitis C: Secondary | ICD-10-CM | POA: Diagnosis not present

## 2014-09-02 DIAGNOSIS — D500 Iron deficiency anemia secondary to blood loss (chronic): Secondary | ICD-10-CM

## 2014-09-02 DIAGNOSIS — R740 Nonspecific elevation of levels of transaminase and lactic acid dehydrogenase [LDH]: Secondary | ICD-10-CM

## 2014-09-02 LAB — CBC & DIFF AND RETIC
BASO%: 0.3 % (ref 0.0–2.0)
Basophils Absolute: 0 10*3/uL (ref 0.0–0.1)
EOS%: 1.4 % (ref 0.0–7.0)
Eosinophils Absolute: 0.1 10*3/uL (ref 0.0–0.5)
HCT: 33.2 % — ABNORMAL LOW (ref 34.8–46.6)
HEMOGLOBIN: 10.5 g/dL — AB (ref 11.6–15.9)
Immature Retic Fract: 12.4 % — ABNORMAL HIGH (ref 1.60–10.00)
LYMPH#: 1.8 10*3/uL (ref 0.9–3.3)
LYMPH%: 27.8 % (ref 14.0–49.7)
MCH: 27.6 pg (ref 25.1–34.0)
MCHC: 31.6 g/dL (ref 31.5–36.0)
MCV: 87.4 fL (ref 79.5–101.0)
MONO#: 1 10*3/uL — ABNORMAL HIGH (ref 0.1–0.9)
MONO%: 15.3 % — AB (ref 0.0–14.0)
NEUT#: 3.5 10*3/uL (ref 1.5–6.5)
NEUT%: 55.2 % (ref 38.4–76.8)
Platelets: 318 10*3/uL (ref 145–400)
RBC: 3.8 10*6/uL (ref 3.70–5.45)
RDW: 15.7 % — AB (ref 11.2–14.5)
RETIC %: 2.12 % — AB (ref 0.70–2.10)
Retic Ct Abs: 80.56 10*3/uL (ref 33.70–90.70)
WBC: 6.3 10*3/uL (ref 3.9–10.3)

## 2014-09-02 LAB — COMPREHENSIVE METABOLIC PANEL (CC13)
ALT: 23 U/L (ref 0–55)
ANION GAP: 12 meq/L — AB (ref 3–11)
AST: 44 U/L — AB (ref 5–34)
Albumin: 2.8 g/dL — ABNORMAL LOW (ref 3.5–5.0)
Alkaline Phosphatase: 80 U/L (ref 40–150)
BUN: 7 mg/dL (ref 7.0–26.0)
CHLORIDE: 96 meq/L — AB (ref 98–109)
CO2: 33 meq/L — AB (ref 22–29)
CREATININE: 0.8 mg/dL (ref 0.6–1.1)
Calcium: 7.4 mg/dL — ABNORMAL LOW (ref 8.4–10.4)
EGFR: >90 mL/min/{1.73_m2} (ref >90)
Glucose: 143 mg/dl — ABNORMAL HIGH (ref 70–140)
Potassium: 2.6 mEq/L — CL (ref 3.5–5.1)
SODIUM: 142 meq/L (ref 136–145)
TOTAL PROTEIN: 6.5 g/dL (ref 6.4–8.3)
Total Bilirubin: 0.45 mg/dL (ref 0.20–1.20)

## 2014-09-02 LAB — IRON AND TIBC CHCC
%SAT: 5 % — ABNORMAL LOW (ref 21–57)
Iron: 21 ug/dL — ABNORMAL LOW (ref 41–142)
TIBC: 388 ug/dL (ref 236–444)
UIBC: 366 ug/dL (ref 120–384)

## 2014-09-02 LAB — FERRITIN CHCC: FERRITIN: 28 ng/mL (ref 9–269)

## 2014-09-02 MED ORDER — POTASSIUM CHLORIDE CRYS ER 20 MEQ PO TBCR
60.0000 meq | EXTENDED_RELEASE_TABLET | Freq: Once | ORAL | Status: AC
  Administered 2014-09-02: 60 meq via ORAL
  Filled 2014-09-02: qty 3

## 2014-09-02 NOTE — Telephone Encounter (Signed)
Gave patient avs report and appoints for April and July. Per 4/7 pof lab 1wk before f/u (54mos) and fera infusion *2 within 2-3 weeks.

## 2014-09-02 NOTE — Progress Notes (Addendum)
[place] OFFICE PROGRESS NOTE   [name], MD [address] [place]  [id number]  DIAGNOSIS: Iron deficiency anemia due to chronic blood loss.   PROBLEM LIST:  1. Recurrent iron-deficiency anemia secondary to gastrointestinal bleeding (as noted above). [name] also has required red cell transfusions in the past, most recently early December 2012. She underwent a small bowel enteroscopy by Dr. Dan Humphreys at [facility] on 08/13/2011. The enteroscope was passed up to 6 feet into the jejunum. He saw multiple AVMs, which were ablated. Two of the large AVMs bled during APC. These were in the 4th part of the duodenum very close to the ligament of Treitz. The remainder of the AVMs were in the proximal jejunum. In addition to the small bowel AV malformations, which were treated with APC, he saw some small esophageal varices which were not bleeding and also evidence of mild portal hypertensive gastropathy.  2. History of duodenal arteriovenous malformation.  3. History of hepatitis C infection.  4. Gastroesophageal reflux disease.  5. Depression.  6. Hypertension.  7. History of right Bell's palsy.  8. Status post left above-knee amputation secondary to necrotizing fasciitis 03/06/2009.  9. Neurodermatitis.  10. Previous history of alcohol usage.  11. Admission to the hospital from 12/20/2011 through 12/25/2011 for hematochezia felt to be secondary to a diverticular bleed, requiring 2 units of packed red cells.  12. Diverticulosis.  13. Elevated alpha fetoprotein, 72.3 on 11/19/2011 and 76.0 on 01/11/2012.  14. Abnormal MRI of the liver from 11/28/2011 showing diffuse and markedly low signal intensity throughout the liver as well as diffuse low signal intensity in the adrenal glands. 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 There were no findings for cirrhosis, portal hypertension,  splenomegaly or ascites, not were there any worrisome enhancing liver lesions.  15. Systolic ejection murmur.   CHIEF COMPLAIN: Follow up anemia  PRIOR TREATMENT: The patient has been heavily dependent upon IV Feraheme. Over the past year or so she has been receiving about 1 Feraheme infusion monthly on average. The patient's most recent infusions of IV Feraheme 510 mg occurred on the following dates: 06/05/2011, 07/03/2011, 07/31/2011, 08/17/2011, 08/28/2011. The patient received IV Feraheme 1020 mg on 10/02/2011 01/30/2012, 06/06/2012 and 09/10/2012, 05/2013, 07/2013, 08/2013, 09/2013, 11/12/2013, 01/15/2014 and 03/16/2014   CURRENT TREATMENT: As noted above. Feraheme prn.   INTERVAL HISTORY:  [name] 64 y.o. female with history of recurrent iron-deficiency anemia due to GI bleeding is here for follow-up. GI bleeding is probably due to AV malformations and diverticulosis.  We have been following her CBC and ferritin on a monthly basis except for the time when she was hospitalized. Since August 2013, the patient required IV Feraheme as noted above.  She still has her want vacuum on, and likely will come off soon. She feels well overall, denies any chest pain, dyspnea on exertion, or any other new symptoms. She uses a wheelchair most of time due to the left leg prosthesis, but does use a walker as needed at home. She denies any signs of bleeding. She noticed urinary dripping and occasional incontinence, but denies any burning feeling or pain with urination no abdominal discomfort or fever.   MEDICAL HISTORY: Past Medical History  Diagnosis Date  . Hypertension   . Colon polyps   . Stomach problems   . Hypokalemic alkalosis   . Allergic rhinitis   . GERD (gastroesophageal reflux disease)   .  Stroke 2010    no residual problems  . Anemia, iron deficiency 05/03/2011    recieves iron infusions  . Anemia   . GI bleeding   . Diverticulitis   . Transfusion history   . Difficulty sleeping      takes trazadone for sleep  . Peripheral vascular disease   . Hepatitis     hep C  . Anxiety   . Shortness of breath     due to anemia     ALLERGIES:  is allergic to ciprofloxacin; morphine and related; penicillins; codeine; and feraheme.  MEDICATIONS: has a current medication list which includes the following prescription(s): accu-chek fastclix lancets, alrex, aspirin ec, atenolol, cetirizine, cyclobenzaprine, esomeprazole, fluorometholone, fluoxetine, gabapentin, glucose blood, hydrochlorothiazide, lactose free nutrition, lisinopril, metformin, oxycodone-acetaminophen, potassium chloride, prednisolone acetate, magnesium (amino acid chelate), trazodone, and vitamin c.  SURGICAL HISTORY:  Past Surgical History  Procedure Laterality Date  . Leg amputation above knee    . Esophagogastroduodenoscopy  05/05/2011    Procedure: ESOPHAGOGASTRODUODENOSCOPY (EGD);  Surgeon: Zenovia Jarred, MD;  Location: [facility];  Service: Gastroenterology;  Laterality: N/A;  Dr. Hilarie Fredrickson will do procedure for Dr. Benson Norway Saturday.  . Esophagogastroduodenoscopy  05/08/2011    Procedure: ESOPHAGOGASTRODUODENOSCOPY (EGD);  Surgeon: Beryle Beams;  Location: [facility];  Service: Endoscopy;  Laterality: N/A;  . Esophagogastroduodenoscopy  06/07/2011    Procedure: ESOPHAGOGASTRODUODENOSCOPY (EGD);  Surgeon: Beryle Beams, MD;  Location: [facility];  Service: Endoscopy;  Laterality: N/A;  . Hot hemostasis  06/07/2011    Procedure: HOT HEMOSTASIS (ARGON PLASMA COAGULATION/BICAP);  Surgeon: Beryle Beams, MD;  Location: [facility];  Service: Endoscopy;  Laterality: N/A;  . Esophagogastroduodenoscopy  12/20/2011    Procedure: ESOPHAGOGASTRODUODENOSCOPY (EGD);  Surgeon: Beryle Beams, MD;  Location: [facility];  Service: Endoscopy;  Laterality: N/A;  . Flexible sigmoidoscopy  12/21/2011    Procedure: FLEXIBLE SIGMOIDOSCOPY;  Surgeon: Beryle Beams, MD;  Location: [facility];  Service: Endoscopy;  Laterality: N/A;   . Carpal tunnel release      rt hand  . Partial colectomy  02/15/2012    Procedure: PARTIAL COLECTOMY;  Surgeon: Harl Bowie, MD;  Location: [facility];  Service: General;  Laterality: N/A;  . Laparotomy  02/16/2012    Procedure: EXPLORATORY LAPAROTOMY;  Surgeon: Edward Jolly, MD;  Location: [facility];  Service: General;  Laterality: N/A;  oversewing of anastomotic leak and rigid sigmoidoscopy  . Enteroscopy N/A 12/04/2012    Procedure: ENTEROSCOPY;  Surgeon: Beryle Beams, MD;  Location: [facility];  Service: Endoscopy;  Laterality: N/A;  . Enteroscopy N/A 12/18/2012    Procedure: ENTEROSCOPY;  Surgeon: Beryle Beams, MD;  Location: [facility];  Service: Endoscopy;  Laterality: N/A;  . Eye surgery Bilateral     cataracts  . Tonsillectomy    . Abdominal hysterectomy    . Insertion of mesh N/A 06/01/2014    Procedure: INSERTION OF MESH;  Surgeon: Coralie Keens, MD;  Location: [place];  Service: General;  Laterality: N/A;  . Incisional hernia repair N/A 06/01/2014    Procedure: ATTEMPTED LAPAROSCOPIC AND OPEN INCISIONAL HERNIA REPAIR WITH MESH;  Surgeon: Coralie Keens, MD;  Location: [facility];  Service: General;  Laterality: N/A;  . Laparotomy N/A 06/21/2014    Procedure: ABDOMINAL WOUND EXPLORATION;  Surgeon: Coralie Keens, MD;  Location: [place];  Service: General;  Laterality: N/A;  . Application of wound vac N/A 06/21/2014    Procedure: APPLICATION OF WOUND VAC;  Surgeon: Coralie Keens, MD;  Location: [facility];  Service: General;  Laterality: N/A;     Medication List       This list is accurate as of: 09/02/14  9:07 AM.  Always use your most recent med list.               ACCU-CHEK FASTCLIX LANCETS Misc  Check your blood sugar one time a day     ALREX 0.2 % Susp  Generic drug:  loteprednol     aspirin EC 81 MG tablet  Take 81 mg by mouth daily.     atenolol 50 MG tablet  Commonly known as:  TENORMIN  Take 50 mg by mouth every morning.     cetirizine 10 MG tablet   Commonly known as:  ZYRTEC  Take 10 mg by mouth every morning.     cyclobenzaprine 10 MG tablet  Commonly known as:  FLEXERIL  Take 1 tablet (10 mg total) by mouth 2 (two) times daily as needed for muscle spasms.     esomeprazole 40 MG capsule  Commonly known as:  NEXIUM  Take 40 mg by mouth 2 (two) times daily.     fluorometholone 0.1 % ophthalmic suspension  Commonly known as:  FML  Place 1 drop into the left eye 2 (two) times daily.     FLUoxetine 40 MG capsule  Commonly known as:  PROZAC  TAKE 1 CAPSULE BY MOUTH DAILY     gabapentin 400 MG capsule  Commonly known as:  NEURONTIN  Take 800 mg by mouth 3 (three) times daily.     glucose blood test strip  Commonly known as:  ACCU-CHEK SMARTVIEW  Check blood sugar  one time a day     hydrochlorothiazide 25 MG tablet  Commonly known as:  HYDRODIURIL  Take 25 mg by mouth every morning.     lactose free nutrition Liqd  Take 237 mLs by mouth 3 (three) times daily with meals.     lisinopril 40 MG tablet  Commonly known as:  PRINIVIL,ZESTRIL  Take 1 tablet (40 mg total) by mouth every morning.     magnesium (amino acid chelate) 133 MG tablet  Take 1 tablet by mouth 2 (two) times daily.     metFORMIN 500 MG tablet  Commonly known as:  GLUCOPHAGE  Take 500 mg by mouth daily with breakfast.     oxyCODONE-acetaminophen 10-325 MG per tablet  Commonly known as:  PERCOCET  Take 1 tablet by mouth every 6 (six) hours as needed for pain.     potassium chloride 20 MEQ/15ML (10%) Soln  TAKE 15ML BY MOUTH EVERY DAY     prednisoLONE acetate 1 % ophthalmic suspension  Commonly known as:  PRED FORTE  Place 1 drop into the right eye daily.     traZODone 100 MG tablet  Commonly known as:  DESYREL  Take 100 mg by mouth at bedtime.     vitamin C 500 MG tablet  Commonly known as:  ASCORBIC ACID  Take 500 mg by mouth every morning.         REVIEW OF SYSTEMS:   Constitutional: Denies fevers, chills or abnormal weight loss;  Reports mild fatigue.  Eyes: Denies blurriness of vision Ears, nose, mouth, throat, and face: Denies mucositis or sore throat Respiratory: Denies cough, dyspnea or wheezes Cardiovascular: Denies palpitation, chest discomfort or lower extremity swelling Gastrointestinal:  Denies nausea, heartburn or change in bowel habits, hernia is painful and bleeds sometime the overlying skin. Skin: Denies abnormal  skin rashes Lymphatics: Denies new lymphadenopathy or easy bruising Neurological:Denies numbness, tingling or new weaknesses Behavioral/Psych: Mood is stable, no new changes  All other systems were reviewed with the patient and are negative.  PHYSICAL EXAMINATION: ECOG PERFORMANCE STATUS: 2-3  Blood pressure 154/78, pulse 79, temperature 98.6 F (37 C), temperature source Oral, resp. rate 18, height 5\' 3"  (1.6 m), weight 170 lb (77.111 kg), SpO2 100 %.  General appearance: alert, appears stated age, no distress and moderately obese  Head: Normocephalic, without obvious abnormality, atraumatic  Neck: no adenopathy, supple, symmetrical, trachea midline and thyroid not enlarged, symmetric, no tenderness/mass/nodules  Lymph nodes: Cervical adenopathy: None appreciated  Heart:regular rate and rhythm, S1, S2 normal and systolic murmur: systolic ejection 2/6, harsh and previously documented without radiation  Lung:chest clear, no wheezing, rales, normal symmetric air entry, Heart exam - S1, S2 normal, no murmur, no gallop, rate regular  Abdomin: soft, distended, normal bowel sounds, nontender, (+) abdominal surgical wound is covered and has a wound vacuum.   EXT:No peripheral edema on right extremity; Left above the knee amputation. She is wearing a prothesis.   Labs:  CBC Latest Ref Rng 09/02/2014 07/07/2014 06/19/2014  WBC 3.9 - 10.3 10e3/uL 6.3 13.2(H) 8.4  Hemoglobin 11.6 - 15.9 g/dL 10.5(L) 12.4 9.7(L)  Hematocrit 34.8 - 46.6 % 33.2(L) 38.8 30.3(L)  Platelets 145 - 400 10e3/uL 318 388 455(H)     CMP Latest Ref Rng 09/02/2014 07/07/2014 06/19/2014  Glucose 70 - 140 mg/dl 143(H) 138 122(H)  BUN 7.0 - 26.0 mg/dL 7.0 21.6 14  Creatinine 0.6 - 1.1 mg/dL 0.8 1.2(H) 0.98  Sodium 136 - 145 mEq/L 142 142 133(L)  Potassium 3.5 - 5.1 mEq/L 2.6(LL) 3.6 3.3(L)  Chloride 96 - 112 mmol/L - - 95(L)  CO2 22 - 29 mEq/L 33(H) 27 26  Calcium 8.4 - 10.4 mg/dL 7.4(L) 7.2(L) 7.6(L)  Total Protein 6.4 - 8.3 g/dL 6.5 7.7 6.1  Total Bilirubin 0.20 - 1.20 mg/dL 0.45 0.46 0.5  Alkaline Phos 40 - 150 U/L 80 72 57  AST 5 - 34 U/L 44(H) 87(H) 35  ALT 0 - 55 U/L 23 47 29    RADIOGRAPHIC STUDIES: Mm Digital Screening Bilateral  07/31/2013   CLINICAL DATA:  Screening.  EXAM: DIGITAL SCREENING BILATERAL MAMMOGRAM WITH CAD  COMPARISON:  Previous exam(s).  ACR Breast Density Category c: The breast tissue is heterogeneously dense, which may obscure small masses.  FINDINGS: There are no findings suspicious for malignancy. Images were processed with CAD.  IMPRESSION: No mammographic evidence of malignancy. A result letter of this screening mammogram will be mailed directly to the patient.  RECOMMENDATION: Screening mammogram in one year. (Code:SM-B-01Y)  BI-RADS CATEGORY  1: Negative.   Electronically Signed   By: Abelardo Diesel M.D.   On: 07/31/2013 09:01   ASSESSMENT: CENIYAH THORP 64 y.o. female with a history of Iron deficiency anemia due to chronic blood loss   PLAN:  1. Iron deficiency anemia secondary to GI lost.  - her hemoglobin today has dropped to 10.5, her ferritin level was 100 two months ago.  -Her iron study and ferritin result is still pending today, I anticipate it will be low giving her recurrent anemia. -I'll set up IV Feraheme 510 mg infusion twice in the next few weeks. -She received IV Feraheme every 2-3 months last year. -I'll see her back in 3 months with iron study 1 week before.  2.abdominal wound infection -she will continue follow up with wound clinic  3. Elevated transiminases,  likely secondary to her chronic hepatitis C  -She was tested positive for hepatitis C in 2009. She has not been treated for hepatitis C, and does not follow with hepatologist. -follow up   4. DM2. --Management per PCP's office.   5. Hypokalemia -Her potassium was 2.6 this morning. I'll give her by mouth potassium 60 mEq here in the clinic -I'll instruct her to increase her potassium from 20 daily 220 twice daily -Repeat a BMP next week. -Potassium-rich to diet  Plan  -IV Feraheme in the next 2 weeks -Return to clinic in 3 months with CBC and iron study one week before.  All questions were answered. The patient knows to call the clinic with any problems, questions or concerns. We can certainly see the patient much sooner if necessary.  I spent 20 minutes counseling the patient face to face. The total time spent in the appointment was 25 minutes.  Truitt Merle 09/02/2014

## 2014-09-02 NOTE — Addendum Note (Signed)
Addended by: Truitt Merle on: 09/02/2014 09:19 AM   Modules accepted: Orders

## 2014-09-03 DIAGNOSIS — T8131XD Disruption of external operation (surgical) wound, not elsewhere classified, subsequent encounter: Secondary | ICD-10-CM | POA: Diagnosis not present

## 2014-09-03 DIAGNOSIS — K7469 Other cirrhosis of liver: Secondary | ICD-10-CM | POA: Diagnosis not present

## 2014-09-03 DIAGNOSIS — E119 Type 2 diabetes mellitus without complications: Secondary | ICD-10-CM | POA: Diagnosis not present

## 2014-09-06 DIAGNOSIS — E119 Type 2 diabetes mellitus without complications: Secondary | ICD-10-CM | POA: Diagnosis not present

## 2014-09-06 DIAGNOSIS — K7469 Other cirrhosis of liver: Secondary | ICD-10-CM | POA: Diagnosis not present

## 2014-09-06 DIAGNOSIS — T8131XD Disruption of external operation (surgical) wound, not elsewhere classified, subsequent encounter: Secondary | ICD-10-CM | POA: Diagnosis not present

## 2014-09-08 DIAGNOSIS — E119 Type 2 diabetes mellitus without complications: Secondary | ICD-10-CM | POA: Diagnosis not present

## 2014-09-08 DIAGNOSIS — K7469 Other cirrhosis of liver: Secondary | ICD-10-CM | POA: Diagnosis not present

## 2014-09-08 DIAGNOSIS — T8131XD Disruption of external operation (surgical) wound, not elsewhere classified, subsequent encounter: Secondary | ICD-10-CM | POA: Diagnosis not present

## 2014-09-09 ENCOUNTER — Other Ambulatory Visit: Payer: Self-pay | Admitting: Internal Medicine

## 2014-09-10 DIAGNOSIS — E119 Type 2 diabetes mellitus without complications: Secondary | ICD-10-CM | POA: Diagnosis not present

## 2014-09-10 DIAGNOSIS — K7469 Other cirrhosis of liver: Secondary | ICD-10-CM | POA: Diagnosis not present

## 2014-09-10 DIAGNOSIS — T8131XD Disruption of external operation (surgical) wound, not elsewhere classified, subsequent encounter: Secondary | ICD-10-CM | POA: Diagnosis not present

## 2014-09-13 DIAGNOSIS — E119 Type 2 diabetes mellitus without complications: Secondary | ICD-10-CM | POA: Diagnosis not present

## 2014-09-13 DIAGNOSIS — K7469 Other cirrhosis of liver: Secondary | ICD-10-CM | POA: Diagnosis not present

## 2014-09-13 DIAGNOSIS — T8131XD Disruption of external operation (surgical) wound, not elsewhere classified, subsequent encounter: Secondary | ICD-10-CM | POA: Diagnosis not present

## 2014-09-15 ENCOUNTER — Telehealth: Payer: Self-pay | Admitting: *Deleted

## 2014-09-15 DIAGNOSIS — T8131XD Disruption of external operation (surgical) wound, not elsewhere classified, subsequent encounter: Secondary | ICD-10-CM | POA: Diagnosis not present

## 2014-09-15 DIAGNOSIS — K7469 Other cirrhosis of liver: Secondary | ICD-10-CM | POA: Diagnosis not present

## 2014-09-15 DIAGNOSIS — E119 Type 2 diabetes mellitus without complications: Secondary | ICD-10-CM | POA: Diagnosis not present

## 2014-09-15 NOTE — Telephone Encounter (Signed)
[facility] reports that am cbg's have been running in the 170's, pt has appt 5/2. [facility]N wants to know if you would like to do something before that appt to get tighter glucose control, please advise

## 2014-09-16 ENCOUNTER — Ambulatory Visit (HOSPITAL_BASED_OUTPATIENT_CLINIC_OR_DEPARTMENT_OTHER): Payer: Medicare Other

## 2014-09-16 VITALS — BP 155/78 | HR 75 | Temp 98.4°F | Resp 20

## 2014-09-16 DIAGNOSIS — D500 Iron deficiency anemia secondary to blood loss (chronic): Secondary | ICD-10-CM | POA: Diagnosis present

## 2014-09-16 MED ORDER — SODIUM CHLORIDE 0.9 % IV SOLN
510.0000 mg | Freq: Once | INTRAVENOUS | Status: AC
  Administered 2014-09-16: 510 mg via INTRAVENOUS
  Filled 2014-09-16: qty 17

## 2014-09-16 MED ORDER — FAMOTIDINE 20 MG PO TABS
20.0000 mg | ORAL_TABLET | Freq: Once | ORAL | Status: AC
  Administered 2014-09-16: 20 mg via ORAL

## 2014-09-16 MED ORDER — FAMOTIDINE 20 MG PO TABS
ORAL_TABLET | ORAL | Status: AC
  Filled 2014-09-16: qty 1

## 2014-09-16 MED ORDER — DIPHENHYDRAMINE HCL 25 MG PO CAPS
ORAL_CAPSULE | ORAL | Status: AC
  Filled 2014-09-16: qty 2

## 2014-09-16 MED ORDER — DIPHENHYDRAMINE HCL 25 MG PO CAPS
50.0000 mg | ORAL_CAPSULE | ORAL | Status: DC
  Administered 2014-09-16: 50 mg via ORAL

## 2014-09-16 NOTE — Telephone Encounter (Signed)
She can increase her metformin to 500mg  BID since she should be taking it once daily.

## 2014-09-16 NOTE — Patient Instructions (Signed)
Ferumoxytol injection What is this medicine? FERUMOXYTOL is an iron complex. Iron is used to make healthy red blood cells, which carry oxygen and nutrients throughout the body. This medicine is used to treat iron deficiency anemia in people with chronic kidney disease. This medicine may be used for other purposes; ask your health care provider or pharmacist if you have questions. COMMON BRAND NAME(S): Feraheme What should I tell my health care provider before I take this medicine? They need to know if you have any of these conditions: -anemia not caused by low iron levels -high levels of iron in the blood -magnetic resonance imaging (MRI) test scheduled -an unusual or allergic reaction to iron, other medicines, foods, dyes, or preservatives -pregnant or trying to get pregnant -breast-feeding How should I use this medicine? This medicine is for injection into a vein. It is given by a health care professional in a hospital or clinic setting. Talk to your pediatrician regarding the use of this medicine in children. Special care may be needed. Overdosage: If you think you've taken too much of this medicine contact a poison control center or emergency room at once. Overdosage: If you think you have taken too much of this medicine contact a poison control center or emergency room at once. NOTE: This medicine is only for you. Do not share this medicine with others. What if I miss a dose? It is important not to miss your dose. Call your doctor or health care professional if you are unable to keep an appointment. What may interact with this medicine? This medicine may interact with the following medications: -other iron products This list may not describe all possible interactions. Give your health care provider a list of all the medicines, herbs, non-prescription drugs, or dietary supplements you use. Also tell them if you smoke, drink alcohol, or use illegal drugs. Some items may interact with your  medicine. What should I watch for while using this medicine? Visit your doctor or healthcare professional regularly. Tell your doctor or healthcare professional if your symptoms do not start to get better or if they get worse. You may need blood work done while you are taking this medicine. You may need to follow a special diet. Talk to your doctor. Foods that contain iron include: whole grains/cereals, dried fruits, beans, or peas, leafy green vegetables, and organ meats (liver, kidney). What side effects may I notice from receiving this medicine? Side effects that you should report to your doctor or health care professional as soon as possible: -allergic reactions like skin rash, itching or hives, swelling of the face, lips, or tongue -breathing problems -changes in blood pressure -feeling faint or lightheaded, falls -fever or chills -flushing, sweating, or hot feelings -swelling of the ankles or feet Side effects that usually do not require medical attention (Report these to your doctor or health care professional if they continue or are bothersome.): -diarrhea -headache -nausea, vomiting -stomach pain This list may not describe all possible side effects. Call your doctor for medical advice about side effects. You may report side effects to FDA at 1-800-FDA-1088. Where should I keep my medicine? This drug is given in a hospital or clinic and will not be stored at home. NOTE: This sheet is a summary. It may not cover all possible information. If you have questions about this medicine, talk to your doctor, pharmacist, or health care provider.  2015, Elsevier/Gold Standard. (2011-12-28 15:23:36)  Blood Transfusion Information WHAT IS A BLOOD TRANSFUSION? A transfusion is the replacement   of blood or some of its parts. Blood is made up of multiple cells which provide different functions.  Red blood cells carry oxygen and are used for blood loss replacement.  White blood cells fight  against infection.  Platelets control bleeding.  Plasma helps clot blood.  Other blood products are available for specialized needs, such as hemophilia or other clotting disorders. BEFORE THE TRANSFUSION  Who gives blood for transfusions?   You may be able to donate blood to be used at a later date on yourself (autologous donation).  Relatives can be asked to donate blood. This is generally not any safer than if you have received blood from a stranger. The same precautions are taken to ensure safety when a relative's blood is donated.  Healthy volunteers who are fully evaluated to make sure their blood is safe. This is blood bank blood. Transfusion therapy is the safest it has ever been in the practice of medicine. Before blood is taken from a donor, a complete history is taken to make sure that person has no history of diseases nor engages in risky social behavior (examples are intravenous drug use or sexual activity with multiple partners). The donor's travel history is screened to minimize risk of transmitting infections, such as malaria. The donated blood is tested for signs of infectious diseases, such as HIV and hepatitis. The blood is then tested to be sure it is compatible with you in order to minimize the chance of a transfusion reaction. If you or a relative donates blood, this is often done in anticipation of surgery and is not appropriate for emergency situations. It takes many days to process the donated blood. RISKS AND COMPLICATIONS Although transfusion therapy is very safe and saves many lives, the main dangers of transfusion include:   Getting an infectious disease.  Developing a transfusion reaction. This is an allergic reaction to something in the blood you were given. Every precaution is taken to prevent this. The decision to have a blood transfusion has been considered carefully by your caregiver before blood is given. Blood is not given unless the benefits outweigh the  risks. AFTER THE TRANSFUSION  Right after receiving a blood transfusion, you will usually feel much better and more energetic. This is especially true if your red blood cells have gotten low (anemic). The transfusion raises the level of the red blood cells which carry oxygen, and this usually causes an energy increase.  The nurse administering the transfusion will monitor you carefully for complications. HOME CARE INSTRUCTIONS  No special instructions are needed after a transfusion. You may find your energy is better. Speak with your caregiver about any limitations on activity for underlying diseases you may have. SEEK MEDICAL CARE IF:   Your condition is not improving after your transfusion.  You develop redness or irritation at the intravenous (IV) site. SEEK IMMEDIATE MEDICAL CARE IF:  Any of the following symptoms occur over the next 12 hours:  Shaking chills.  You have a temperature by mouth above 102 F (38.9 C), not controlled by medicine.  Chest, back, or muscle pain.  People around you feel you are not acting correctly or are confused.  Shortness of breath or difficulty breathing.  Dizziness and fainting.  You get a rash or develop hives.  You have a decrease in urine output.  Your urine turns a dark color or changes to pink, red, or brown. Any of the following symptoms occur over the next 10 days:  You have a temperature   by mouth above 102 F (38.9 C), not controlled by medicine.  Shortness of breath.  Weakness after normal activity.  The white part of the eye turns yellow (jaundice).  You have a decrease in the amount of urine or are urinating less often.  Your urine turns a dark color or changes to pink, red, or brown. Document Released: 05/11/2000 Document Revised: 08/06/2011 Document Reviewed: 12/29/2007 ExitCare Patient Information 2015 [employer]. This information is not intended to replace advice given to you by your health care provider. Make  sure you discuss any questions you have with your health care provider.   

## 2014-09-17 DIAGNOSIS — T8131XD Disruption of external operation (surgical) wound, not elsewhere classified, subsequent encounter: Secondary | ICD-10-CM | POA: Diagnosis not present

## 2014-09-17 DIAGNOSIS — K7469 Other cirrhosis of liver: Secondary | ICD-10-CM | POA: Diagnosis not present

## 2014-09-17 DIAGNOSIS — E119 Type 2 diabetes mellitus without complications: Secondary | ICD-10-CM | POA: Diagnosis not present

## 2014-09-20 DIAGNOSIS — K7469 Other cirrhosis of liver: Secondary | ICD-10-CM | POA: Diagnosis not present

## 2014-09-20 DIAGNOSIS — T8131XD Disruption of external operation (surgical) wound, not elsewhere classified, subsequent encounter: Secondary | ICD-10-CM | POA: Diagnosis not present

## 2014-09-20 DIAGNOSIS — E119 Type 2 diabetes mellitus without complications: Secondary | ICD-10-CM | POA: Diagnosis not present

## 2014-09-22 ENCOUNTER — Encounter: Payer: Self-pay | Admitting: Internal Medicine

## 2014-09-22 ENCOUNTER — Other Ambulatory Visit: Payer: Self-pay | Admitting: *Deleted

## 2014-09-22 ENCOUNTER — Ambulatory Visit (INDEPENDENT_AMBULATORY_CARE_PROVIDER_SITE_OTHER): Payer: Medicare Other | Admitting: Internal Medicine

## 2014-09-22 VITALS — BP 130/75 | HR 86 | Temp 98.3°F

## 2014-09-22 DIAGNOSIS — D509 Iron deficiency anemia, unspecified: Secondary | ICD-10-CM

## 2014-09-22 DIAGNOSIS — T8189XS Other complications of procedures, not elsewhere classified, sequela: Secondary | ICD-10-CM

## 2014-09-22 DIAGNOSIS — T8131XD Disruption of external operation (surgical) wound, not elsewhere classified, subsequent encounter: Secondary | ICD-10-CM | POA: Diagnosis not present

## 2014-09-22 DIAGNOSIS — E119 Type 2 diabetes mellitus without complications: Secondary | ICD-10-CM

## 2014-09-22 DIAGNOSIS — R197 Diarrhea, unspecified: Secondary | ICD-10-CM

## 2014-09-22 DIAGNOSIS — K7469 Other cirrhosis of liver: Secondary | ICD-10-CM | POA: Diagnosis not present

## 2014-09-22 DIAGNOSIS — K439 Ventral hernia without obstruction or gangrene: Secondary | ICD-10-CM

## 2014-09-22 LAB — CBC WITH DIFFERENTIAL/PLATELET
BASOS ABS: 0 10*3/uL (ref 0.0–0.1)
Basophils Relative: 0 % (ref 0–1)
EOS ABS: 0.1 10*3/uL (ref 0.0–0.7)
EOS PCT: 2 % (ref 0–5)
HCT: 34.1 % — ABNORMAL LOW (ref 36.0–46.0)
Hemoglobin: 10.8 g/dL — ABNORMAL LOW (ref 12.0–15.0)
Lymphocytes Relative: 29 % (ref 12–46)
Lymphs Abs: 1.8 10*3/uL (ref 0.7–4.0)
MCH: 27.7 pg (ref 26.0–34.0)
MCHC: 31.7 g/dL (ref 30.0–36.0)
MCV: 87.4 fL (ref 78.0–100.0)
MONO ABS: 0.9 10*3/uL (ref 0.1–1.0)
Monocytes Relative: 14 % — ABNORMAL HIGH (ref 3–12)
Neutro Abs: 3.5 10*3/uL (ref 1.7–7.7)
Neutrophils Relative %: 55 % (ref 43–77)
PLATELETS: 332 10*3/uL (ref 150–400)
RBC: 3.9 MIL/uL (ref 3.87–5.11)
RDW: 15.5 % (ref 11.5–15.5)
WBC: 6.2 10*3/uL (ref 4.0–10.5)

## 2014-09-22 LAB — BASIC METABOLIC PANEL
Anion gap: 11 (ref 5–15)
BUN: <5 mg/dL — ABNORMAL LOW (ref 6–23)
CALCIUM: 7.4 mg/dL — AB (ref 8.4–10.5)
CO2: 28 mmol/L (ref 19–32)
CREATININE: 0.82 mg/dL (ref 0.50–1.10)
Chloride: 98 mmol/L (ref 96–112)
GFR calc non Af Amer: 76 mL/min — ABNORMAL LOW (ref >90)
GFR, EST AFRICAN AMERICAN: 88 mL/min — AB (ref >90)
Glucose, Bld: 165 mg/dL — ABNORMAL HIGH (ref 70–99)
Potassium: 2.8 mmol/L — ABNORMAL LOW (ref 3.5–5.1)
Sodium: 137 mmol/L (ref 135–145)

## 2014-09-22 LAB — GLUCOSE, CAPILLARY: Glucose-Capillary: 197 mg/dL — ABNORMAL HIGH (ref 70–99)

## 2014-09-22 NOTE — Telephone Encounter (Signed)
Last refill 4/3 ( per pt )

## 2014-09-22 NOTE — Patient Instructions (Addendum)
-   Drink plenty of fluids - Make sure you are only taking 1 pill of Percocet every 6 hours AS NEEDED - If you continue to have frequent bloody bowel movements or feel that your symptoms are worsening then please go to the ED - We will have you follow up in 2 days (Friday 4/29)   General Instructions:   Please bring your medicines with you each time you come to clinic.  Medicines may include prescription medications, over-the-counter medications, herbal remedies, eye drops, vitamins, or other pills.   Progress Toward Treatment Goals:  Treatment Goal 05/14/2014  Blood pressure -  Stop smoking -  Prevent falls at goal    Self Care Goals & Plans:  Self Care Goal 05/14/2014  Manage my medications take my medicines as prescribed; bring my medications to every visit; refill my medications on time  Monitor my health -  Eat healthy foods drink diet soda or water instead of juice or soda; eat more vegetables; eat foods that are low in salt; eat baked foods instead of fried foods; eat fruit for snacks and desserts  Be physically active -  Stop smoking -  Prevent falls -    No flowsheet data found.   Care Management & Community Referrals:  Referral 05/14/2014  Referrals made for care management support [facility] case management program

## 2014-09-23 ENCOUNTER — Telehealth: Payer: Self-pay | Admitting: Internal Medicine

## 2014-09-23 ENCOUNTER — Ambulatory Visit (HOSPITAL_BASED_OUTPATIENT_CLINIC_OR_DEPARTMENT_OTHER): Payer: Medicare Other

## 2014-09-23 ENCOUNTER — Other Ambulatory Visit: Payer: Self-pay | Admitting: Hematology

## 2014-09-23 VITALS — BP 147/80 | HR 81 | Temp 98.8°F | Resp 16

## 2014-09-23 DIAGNOSIS — R197 Diarrhea, unspecified: Secondary | ICD-10-CM | POA: Insufficient documentation

## 2014-09-23 DIAGNOSIS — D500 Iron deficiency anemia secondary to blood loss (chronic): Secondary | ICD-10-CM

## 2014-09-23 LAB — CLOSTRIDIUM DIFFICILE BY PCR: CDIFFPCR: NEGATIVE

## 2014-09-23 MED ORDER — DIPHENHYDRAMINE HCL 25 MG PO CAPS
ORAL_CAPSULE | ORAL | Status: AC
  Filled 2014-09-23: qty 2

## 2014-09-23 MED ORDER — SODIUM CHLORIDE 0.9 % IV SOLN
INTRAVENOUS | Status: DC
  Administered 2014-09-23: 10:00:00 via INTRAVENOUS

## 2014-09-23 MED ORDER — OXYCODONE-ACETAMINOPHEN 10-325 MG PO TABS: 1.0000 | ORAL_TABLET | Freq: Four times a day (QID) | ORAL | Status: DC | PRN

## 2014-09-23 MED ORDER — DIPHENHYDRAMINE HCL 25 MG PO CAPS
50.0000 mg | ORAL_CAPSULE | Freq: Once | ORAL | Status: AC
  Administered 2014-09-23: 50 mg via ORAL

## 2014-09-23 MED ORDER — SODIUM CHLORIDE 0.9 % IV SOLN
510.0000 mg | Freq: Once | INTRAVENOUS | Status: AC
  Administered 2014-09-23: 510 mg via INTRAVENOUS
  Filled 2014-09-23: qty 17

## 2014-09-23 MED ORDER — FAMOTIDINE 20 MG PO TABS
20.0000 mg | ORAL_TABLET | Freq: Once | ORAL | Status: AC
  Administered 2014-09-23: 20 mg via ORAL

## 2014-09-23 MED ORDER — FAMOTIDINE 20 MG PO TABS
ORAL_TABLET | ORAL | Status: AC
  Filled 2014-09-23: qty 1

## 2014-09-23 NOTE — Telephone Encounter (Signed)
Spoke with Ms. McNair, patient's emergency contact. Instructed her to tell Ms. Tuckett that she needs to take double the dose of potassium since her K was 2.8. She has an appointment tomorrow and we will recheck bmet.

## 2014-09-23 NOTE — Addendum Note (Signed)
Addended by: Juliet Rude on: 09/23/2014 04:03 PM   Modules accepted: Level of Service

## 2014-09-23 NOTE — Progress Notes (Signed)
   Subjective:    Patient ID: [name], female    DOB: Oct 11, 1950, 64 y.o.   MRN: [id number]  HPI Ms. Stroschein is a 64yo woman with PMHx of HTN, Type 2 DM, iron deficiency anemia, depression, s/p ventral hernia repair in Feb 3244 complicated by wound dehiscence that required debridement and wound vac who presents today for an acute visit.  Patient reports starting last night she had body aches all over, chills, and diarrhea. She notes having diarrhea every 30-40 minutes last night. She reports seeing bright red blood in her stool. She also notes abdominal pain in the middle of her abdomen in the area surrounding her wound. She denies fever, sore throat, dyspnea, nausea, vomiting, and dysuria. She reports taking antibiotics in the last month, but not currently. She has a history of iron deficiency anemia due to chronic GI bleeding, diverticulosis, and hemorrhoids per flex sig in 2013.    Review of Systems General: Denies night sweats, changes in weight, changes in appetite HEENT: Denies headaches, ear pain, changes in vision, rhinorrhea CV: Denies palpitations, orthopnea Pulm: Denies cough, wheezing GI: See above  GU: Denies hematuria, frequency Msk: Denies muscle cramps, joint pains Neuro: Denies weakness, numbness, tingling Skin: Denies rashes, bruising    Objective:   Physical Exam General: sitting up in chair, appears tired HEENT: [place]/AT, EOMI, pharynx non-erythematous, mucus membranes moist CV: RRR, no m/g/r Pulm: CTA bilaterally, breaths non-labored Abd: BS+, soft, mild tenderness to palpation in area surrounding wound vac. Wound vac in place and running.  Ext: warm, no edema, moves all Neuro: alert and oriented x 3 but appears sleepy on exam, no focal deficits    Assessment & Plan:  Please refer to A&P documentation

## 2014-09-23 NOTE — Assessment & Plan Note (Signed)
Patient with frequent episodes of reported bloody diarrhea, chills, and body aches. Patient had a bowel movement while at the office and examination of her stool revealed brown stool, no melena or hematochezia present. Patient does have a history of iron deficiency anemia due to chronic GI loss for which she receives IV feraheme. Possible that she was having bloody stools at home. There is also a concern for C diff infection given the frequent episodes of diarrhea in the setting of recent antibiotic use with her wound dehiscence. Differential also includes viral gastroenteritis given the chills and body aches.  - Will check C diff toxin>> negative -  Check Hbg>> Hbg 10.8, at baseline - Check bmet>> K low at 2.8. Patient was called and told to take double her dose of potassium. Will repeat at visit tomorrow.  - Follow up in 2 days to re-evaluate - Recommended conservative treatments of drinking plenty of fluids and rest

## 2014-09-23 NOTE — Progress Notes (Signed)
0915-Pre meds clarified with Dr. Burr Medico.  Pt to receive Pepcid 20 mg PO and Benadryl 50 mg PO prior to receiving Feraheme.  Pt is to f/u with [facility] clinic appt tomorrow about K level.

## 2014-09-23 NOTE — Patient Instructions (Addendum)

## 2014-09-24 ENCOUNTER — Ambulatory Visit (INDEPENDENT_AMBULATORY_CARE_PROVIDER_SITE_OTHER): Payer: Medicare Other | Admitting: Internal Medicine

## 2014-09-24 ENCOUNTER — Encounter: Payer: Self-pay | Admitting: Internal Medicine

## 2014-09-24 VITALS — BP 153/88 | HR 80 | Temp 98.3°F

## 2014-09-24 DIAGNOSIS — R197 Diarrhea, unspecified: Secondary | ICD-10-CM | POA: Diagnosis not present

## 2014-09-24 DIAGNOSIS — E119 Type 2 diabetes mellitus without complications: Secondary | ICD-10-CM | POA: Diagnosis not present

## 2014-09-24 DIAGNOSIS — T8131XD Disruption of external operation (surgical) wound, not elsewhere classified, subsequent encounter: Secondary | ICD-10-CM | POA: Diagnosis not present

## 2014-09-24 DIAGNOSIS — K7469 Other cirrhosis of liver: Secondary | ICD-10-CM | POA: Diagnosis not present

## 2014-09-24 LAB — BASIC METABOLIC PANEL WITH GFR
BUN: 5 mg/dL — ABNORMAL LOW (ref 6–23)
CO2: 27 mEq/L (ref 19–32)
Calcium: 7.5 mg/dL — ABNORMAL LOW (ref 8.4–10.5)
Chloride: 97 mEq/L (ref 96–112)
Creat: 0.62 mg/dL (ref 0.50–1.10)
Glucose, Bld: 104 mg/dL — ABNORMAL HIGH (ref 70–99)
Potassium: 2.7 mEq/L — CL (ref 3.5–5.3)
Sodium: 138 mEq/L (ref 135–145)

## 2014-09-24 LAB — GLUCOSE, CAPILLARY: Glucose-Capillary: 112 mg/dL — ABNORMAL HIGH (ref 70–99)

## 2014-09-24 NOTE — Assessment & Plan Note (Signed)
Improving. Last diarrheal BM was yesterday afternoon. She feels much better. Likely etiology was a viral enteritis. C diff toxin>> negative. Plan  - encouraged increased fluid intake  - recheck bmek. K low at 2.8 last OV. She was instructed to double her dose of potassium.  - Follow up on Monday with Dr Algis Liming. She already has an appt

## 2014-09-24 NOTE — Patient Instructions (Signed)
General Instructions: Please continue taking plenty of fluids i will check your potassium level today Please come back if your symptoms worsen, otherwise follow up with Dr Algis Liming   Please bring your medicines with you each time you come to clinic.  Medicines may include prescription medications, over-the-counter medications, herbal remedies, eye drops, vitamins, or other pills.   Progress Toward Treatment Goals:  Treatment Goal 05/14/2014  Blood pressure -  Stop smoking -  Prevent falls at goal    Self Care Goals & Plans:  Self Care Goal 09/24/2014  Manage my medications take my medicines as prescribed; bring my medications to every visit; refill my medications on time; follow the sick day instructions if I am sick  Monitor my health keep track of my blood pressure; check my feet daily  Eat healthy foods eat more vegetables; eat fruit for snacks and desserts; eat baked foods instead of fried foods; eat smaller portions; drink diet soda or water instead of juice or soda  Be physically active find an activity I enjoy  Stop smoking -  Prevent falls -    No flowsheet data found.   Care Management & Community Referrals:  Referral 05/14/2014  Referrals made for care management support [facility] case management program

## 2014-09-24 NOTE — Progress Notes (Signed)
   Subjective:    Patient ID: [name], female    DOB: 29-Jun-1950, 64 y.o.   MRN: [id number]  HPI Ms. Keltz is a 64yo woman with PMHx of HTN, Type 2 DM, iron deficiency anemia, depression, s/p ventral hernia repair in Feb 6962 complicated by wound dehiscence that required debridement and wound vac who presents for a follow up visit due to diarrhea.  She was seen here 2 days ago by Dr Arcelia Jew for severe bloody diarrhea. C.diff was neg but she was found to have K level of 2.8 on her BMP. She was advised to increase her usual dose of KCL to twice a day which she has been doing. She reports that the diarrhea has improved. Last loose bowel movement was yesterday afternoon. No abdominal pain. She denies fever, sore throat, dyspnea, nausea, vomiting, and dysuria. She reports taking antibiotics in the last month, but not currently. She has a history of iron deficiency anemia due to chronic GI bleeding, diverticulosis, and hemorrhoids per flex sig in 2013.    Review of Systems General: Denies night sweats, changes in weight, changes in appetite HEENT: Denies headaches, ear pain, changes in vision, rhinorrhea CV: Denies palpitations, orthopnea Pulm: Denies cough, wheezing GI: See above  GU: Denies hematuria, frequency Msk: Denies muscle cramps, joint pains Neuro: Denies weakness, numbness, tingling Skin: Denies rashes, bruising    Objective:   Physical Exam General: sitting up in chair, appears tired. Daughter accompanies her to OV HEENT: Wisdom/AT, EOMI, pharynx non-erythematous, mucus membranes moist CV: RRR, no m/g/r Pulm: CTA bilaterally, breaths non-labored Abd: BS+, soft, mild tenderness to palpation in area surrounding wound vac. Wound vac in place and running.  Ext: warm, no edema, moves all Neuro: alert and oriented x 3 but appears sleepy on exam, no focal deficits    Assessment & Plan:  I have discussed my assessment and plan  with Dr. Lynnae January Please see details under problem based  charting.

## 2014-09-24 NOTE — Telephone Encounter (Signed)
No answer, no vmail 

## 2014-09-24 NOTE — Progress Notes (Signed)
Internal Medicine Clinic Attending  Case discussed with Dr. Kazibwe soon after the resident saw the patient.  We reviewed the resident's history and exam and pertinent patient test results.  I agree with the assessment, diagnosis, and plan of care documented in the resident's note. 

## 2014-09-25 ENCOUNTER — Telehealth: Payer: Self-pay | Admitting: Internal Medicine

## 2014-09-25 NOTE — Telephone Encounter (Signed)
Paged by Randell Loop regarding K of 2.7. Attempted to call patient x3 throughout the day to instruct her to take increased dose of potassium, however, has unable to reach her at bot numbers listed in chart. Scheduled for clinic visit w/ Dr. Algis Liming on 09/27/14.   Signed: Luanne Bras, MD 09/25/2014 1:52 PM

## 2014-09-26 ENCOUNTER — Telehealth: Payer: Self-pay | Admitting: Internal Medicine

## 2014-09-26 NOTE — Progress Notes (Signed)
I saw and evaluated the patient.  I personally confirmed the key portions of Dr. Shela Commons history and exam and reviewed pertinent patient test results.  The assessment, diagnosis, and plan were formulated together and I agree with the documentation in the resident's note.  She has a history of bleeding duodenal AVMs requiring endoscopic intervention.  She was also found to have a small sessile sigmoid polyp on 02/23/2010 and biopsy could not be performed at that time.  Recommendations were to repeat colonoscopy in 6 months to 1 year.  A flexible sigmoidoscopy was done on 12/21/2011 instead and no sigmoid polyp was identified although she did have some diverticulosis.  Thus, the bleeding she reports could be secondary to hemorrhoids, sigmoid diverticulosis, or a duodenal AVM.  Our personal visualization of the stool did not reveal any overt blood or melena.  Her Hct was 34.1 which was stable compared to the value 3 weeks ago.  We will not proceed to invasive evaluation unless the [facility] returns.

## 2014-09-26 NOTE — Telephone Encounter (Signed)
Spoke w/ patient today about her K of 2.7. Patient claims she takes 20 mEq bid. Informed her to take 80 mEq total today and follow up in the [facility] clinic tomorrow w/ Dr. Algis Liming at which time her BMP can be reassessed. Patient understood.   Natasha Bence, MD PGY-2, Internal Medicine Pager: [phone]

## 2014-09-26 NOTE — Addendum Note (Signed)
Addended by: Oval Linsey D on: 09/26/2014 08:54 AM   Modules accepted: Level of Service

## 2014-09-27 ENCOUNTER — Encounter: Payer: Self-pay | Admitting: Internal Medicine

## 2014-09-27 ENCOUNTER — Other Ambulatory Visit (INDEPENDENT_AMBULATORY_CARE_PROVIDER_SITE_OTHER): Payer: Self-pay

## 2014-09-27 ENCOUNTER — Ambulatory Visit (INDEPENDENT_AMBULATORY_CARE_PROVIDER_SITE_OTHER): Payer: Medicare Other | Admitting: Internal Medicine

## 2014-09-27 VITALS — BP 136/75 | HR 77 | Temp 98.4°F | Ht 63.0 in

## 2014-09-27 DIAGNOSIS — E119 Type 2 diabetes mellitus without complications: Secondary | ICD-10-CM | POA: Diagnosis not present

## 2014-09-27 DIAGNOSIS — R197 Diarrhea, unspecified: Secondary | ICD-10-CM | POA: Diagnosis not present

## 2014-09-27 DIAGNOSIS — R109 Unspecified abdominal pain: Secondary | ICD-10-CM

## 2014-09-27 DIAGNOSIS — Z9889 Other specified postprocedural states: Secondary | ICD-10-CM

## 2014-09-27 DIAGNOSIS — R1033 Periumbilical pain: Secondary | ICD-10-CM

## 2014-09-27 DIAGNOSIS — E876 Hypokalemia: Secondary | ICD-10-CM | POA: Diagnosis not present

## 2014-09-27 HISTORY — DX: Hypokalemia: E87.6

## 2014-09-27 LAB — BASIC METABOLIC PANEL
ANION GAP: 10 (ref 5–15)
CALCIUM: 7.6 mg/dL — AB (ref 8.9–10.3)
CO2: 30 mmol/L (ref 22–32)
Chloride: 101 mmol/L (ref 101–111)
Creatinine, Ser: 0.81 mg/dL (ref 0.44–1.00)
GFR calc Af Amer: >60 mL/min (ref >60)
GFR calc non Af Amer: >60 mL/min (ref >60)
Glucose, Bld: 150 mg/dL — ABNORMAL HIGH (ref 70–99)
POTASSIUM: 4.3 mmol/L (ref 3.5–5.1)
Sodium: 141 mmol/L (ref 135–145)

## 2014-09-27 MED ORDER — GLUCOSE BLOOD VI STRP: ORAL_STRIP | Status: DC

## 2014-09-27 NOTE — Patient Instructions (Signed)
General Instructions:   Please try to bring all your medicines next time. This will help [place] keep you safe from mistakes.  We are rechecking your labs to make sure your potassium is ok. I will call you and let you know if you need to take more of the potassium.   You have an appt today at 345 pm to see someone at the surgery urgent care. Hope you feel better soon. Progress Toward Treatment Goals:  Treatment Goal 05/14/2014  Blood pressure -  Stop smoking -  Prevent falls at goal    Self Care Goals & Plans:  Self Care Goal 09/27/2014  Manage my medications take my medicines as prescribed; bring my medications to every visit; refill my medications on time  Monitor my health -  Eat healthy foods drink diet soda or water instead of juice or soda; eat more vegetables; eat foods that are low in salt; eat baked foods instead of fried foods  Be physically active -  Stop smoking -  Prevent falls -    No flowsheet data found.   Care Management & Community Referrals:  Referral 05/14/2014  Referrals made for care management support [facility] case management program

## 2014-09-27 NOTE — Assessment & Plan Note (Signed)
>>  ASSESSMENT AND PLAN FOR TYPE 2 DIABETES MELLITUS WITH DIABETIC NEUROPATHY ([facility]) WRITTEN ON 09/27/2014  9:51 AM BY SADEK, ALTAMESE HERO, MD  Pt needed refill on her strips for measuring. Has not had any hypoglycemic episodes during this time. Will address once acute issues resolved.

## 2014-09-27 NOTE — Assessment & Plan Note (Signed)
Pt needed refill on her strips for measuring. Has not had any hypoglycemic episodes during this time. Will address once acute issues resolved.

## 2014-09-27 NOTE — Assessment & Plan Note (Signed)
Pt states this still seems to be a problem. Workup thus far has included a negative C. difficile, stable hemoglobin, no leukocytosis, and hypokalemia. The patient also had a CT on 08/12/14 for evaluating this pain that did show concern for inflamed epiploic appendage rather than abscess but given this prolonged course may have now developed into an abscess. The patient continues to have marked tenderness near her wound VAC site in the right lower quadrant. This has been ongoing since after her operation with the patient states has been recently exacerbated over the past week. The patient refused to be weighed today therefore it is unknown whether the patient has lost significant weight. She has continued to take her metformin but has not noticed any change in her diarrhea and has previously not had any GI upset as a side effect of taking metformin. There is been no document of fever since the patient has presented for this complaint. Wt Readings from Last 3 Encounters:  09/02/14 170 lb (77.111 kg)  08/13/14 168 lb 11.2 oz (76.522 kg)  07/07/14 154 lb 14.4 oz (70.262 kg)   -Stat bmet to investigate hypokalemia -Personal phone call was made to Dr. Trevor Mace office for a urgent surgical appointment which was made for 09/27/2014 at 3:45 PM -Follow-up if fails to improve will hold off on antibiotics at this time

## 2014-09-27 NOTE — Assessment & Plan Note (Signed)
Lab Results  Component Value Date   K 2.7* 09/24/2014   Pt had labs drawn and was informed to take 80 mEq of KCL over the weekend. This is likely in the setting of the patient's diarrhea.  -repeat Bmet -cont 20 mEq KCL and will update pt if abnormal.

## 2014-09-27 NOTE — Progress Notes (Signed)
Subjective:   Patient ID: TAELOR WAYMIRE female   DOB: July 05, 1950 64 y.o.   MRN: [id number]  HPI: Ms.Ronita D Coonan is a 64 y.o. woman pmh as listed below presents for hypokalemia.   Patient was seen on 09/24/14 for ongoing diarrhea. Labs showed a potassium of 2.7 the patient was informed of these results and told to take 80 mEq of potassium over the weekend and come in for lab recheck. The patient states that she did follow these instructions. Other pertinent labs showed negative C. difficile. The patient states that her symptoms have not improved. Her Hgb has been stable. She has not noticed any hematochezia but has continued to have decreased appetite and ongoing abdominal pain near the wound vac site that has not improved since January but only recently exacerbated over the past week. She has not had any vomiting but chills.   Past Medical History  Diagnosis Date  . Hypertension   . Colon polyps   . Stomach problems   . Hypokalemic alkalosis   . Allergic rhinitis   . GERD (gastroesophageal reflux disease)   . Stroke 2010    no residual problems  . Anemia, iron deficiency 05/03/2011    recieves iron infusions  . Anemia   . GI bleeding   . Diverticulitis   . Transfusion history   . Difficulty sleeping     takes trazadone for sleep  . Peripheral vascular disease   . Hepatitis     hep C  . Anxiety   . Shortness of breath     due to anemia   Current Outpatient Prescriptions  Medication Sig Dispense Refill  . ACCU-CHEK FASTCLIX LANCETS MISC Check your blood sugar one time a day 102 each 5  . ALREX 0.2 % SUSP     . aspirin EC 81 MG tablet Take 81 mg by mouth daily.     Marland Kitchen atenolol (TENORMIN) 50 MG tablet Take 50 mg by mouth every morning.     . cetirizine (ZYRTEC) 10 MG tablet Take 10 mg by mouth every morning.     . cyclobenzaprine (FLEXERIL) 10 MG tablet Take 1 tablet (10 mg total) by mouth 2 (two) times daily as needed for muscle spasms. 20 tablet 0  . esomeprazole (NEXIUM)  40 MG capsule Take 40 mg by mouth 2 (two) times daily.    . fluorometholone (FML) 0.1 % ophthalmic suspension Place 1 drop into the left eye 2 (two) times daily.     Marland Kitchen FLUoxetine (PROZAC) 40 MG capsule TAKE 1 CAPSULE BY MOUTH DAILY 30 capsule 5  . gabapentin (NEURONTIN) 400 MG capsule Take 800 mg by mouth 3 (three) times daily.    Marland Kitchen glucose blood (ACCU-CHEK SMARTVIEW) test strip Check blood sugar  one time a day 50 each 12  . hydrochlorothiazide (HYDRODIURIL) 25 MG tablet Take 25 mg by mouth every morning.    . lactose free nutrition (BOOST PLUS) LIQD Take 237 mLs by mouth 3 (three) times daily with meals. 237 mL 0  . lisinopril (PRINIVIL,ZESTRIL) 40 MG tablet Take 1 tablet (40 mg total) by mouth every morning. 90 tablet 4  . metFORMIN (GLUCOPHAGE) 500 MG tablet TAKE 1 TABLET BY MOUTH EVERY DAY WITH BREAKFAST 90 tablet 5  . oxyCODONE-acetaminophen (PERCOCET) 10-325 MG per tablet Take 1 tablet by mouth every 6 (six) hours as needed for pain. 120 tablet 0  . potassium chloride 20 MEQ/15ML (10%) SOLN TAKE 15ML BY MOUTH EVERY DAY 450 mL 2  .  prednisoLONE acetate (PRED FORTE) 1 % ophthalmic suspension Place 1 drop into the right eye daily.     Marland Kitchen Specialty Vitamins Products (MAGNESIUM, AMINO ACID CHELATE,) 133 MG tablet Take 1 tablet by mouth 2 (two) times daily.    . traZODone (DESYREL) 100 MG tablet Take 100 mg by mouth at bedtime.    . vitamin C (ASCORBIC ACID) 500 MG tablet Take 500 mg by mouth every morning.     No current facility-administered medications for this visit.   Family History  Problem Relation Age of Onset  . Cancer Father     unknown  . Hypertension Mother   . Diverticulitis Mother    History   Social History  . Marital Status: Widowed    Spouse Name: N/A  . Number of Children: N/A  . Years of Education: N/A   Social History Main Topics  . Smoking status: Current Every Day Smoker -- 0.20 packs/day for 17 years    Types: Cigarettes  . Smokeless tobacco: Never Used      Comment: wants to quit.  Smoking less 1 pk per week  . Alcohol Use: 0.0 oz/week    0 Standard drinks or equivalent per week     Comment: Occasionally  . Drug Use: No     Comment: Thanksgiving and Christmas  Holidays 2014 hasn't had any since  . Sexual Activity: Not on file   Other Topics Concern  . None   Social History Narrative   Review of Systems: Pertinent items are noted in HPI. Objective:  Physical Exam: Filed Vitals:   09/27/14 0849  BP: 136/75  Pulse: 77  Temp: 98.4 F (36.9 C)  TempSrc: Oral  Height: 5\' 3"  (1.6 m)  SpO2: 100%   General: sitting in chair, NAD  HEENT: no scleral icterus Cardiac: RRR, no rubs, murmurs or gallops Pulm: clear to auscultation bilaterally, moving normal volumes of air Abd: soft,ttp over RLQ and near wound vac site, no visible pus or areas or induration, nondistended, BS hyperactive Ext: warm and well perfused, no pedal edema Neuro: alert and oriented X3, cranial nerves II-XII grossly intact  Assessment & Plan:  Please see problem oriented charting  Pt discussed with Dr. Dareen Piano

## 2014-09-27 NOTE — Progress Notes (Signed)
INTERNAL MEDICINE TEACHING ATTENDING ADDENDUM - Ralpheal Zappone, MD: I reviewed and discussed at the time of visit with the resident Dr. Sadek, the patient's medical history, physical examination, diagnosis and results of pertinent tests and treatment and I agree with the patient's care as documented.  

## 2014-09-29 ENCOUNTER — Other Ambulatory Visit (INDEPENDENT_AMBULATORY_CARE_PROVIDER_SITE_OTHER): Payer: Self-pay

## 2014-09-29 ENCOUNTER — Ambulatory Visit
Admission: RE | Admit: 2014-09-29 | Discharge: 2014-09-29 | Disposition: A | Discharge status: Home / Self Care | Payer: Medicare Other | Source: Ambulatory Visit | Attending: General Surgery | Admitting: General Surgery

## 2014-09-29 DIAGNOSIS — T8131XD Disruption of external operation (surgical) wound, not elsewhere classified, subsequent encounter: Secondary | ICD-10-CM | POA: Diagnosis not present

## 2014-09-29 DIAGNOSIS — R1031 Right lower quadrant pain: Secondary | ICD-10-CM | POA: Diagnosis not present

## 2014-09-29 DIAGNOSIS — E119 Type 2 diabetes mellitus without complications: Secondary | ICD-10-CM | POA: Diagnosis not present

## 2014-09-29 DIAGNOSIS — K7469 Other cirrhosis of liver: Secondary | ICD-10-CM | POA: Diagnosis not present

## 2014-09-29 DIAGNOSIS — Z9889 Other specified postprocedural states: Secondary | ICD-10-CM | POA: Diagnosis not present

## 2014-09-29 DIAGNOSIS — R1033 Periumbilical pain: Secondary | ICD-10-CM

## 2014-09-29 MED ORDER — IOPAMIDOL (ISOVUE-300) INJECTION 61%
100.0000 mL | Freq: Once | INTRAVENOUS | Status: AC | PRN
  Administered 2014-09-29: 100 mL via INTRAVENOUS

## 2014-09-30 ENCOUNTER — Other Ambulatory Visit: Payer: Self-pay | Admitting: General Surgery

## 2014-09-30 ENCOUNTER — Ambulatory Visit
Admission: RE | Admit: 2014-09-30 | Discharge: 2014-09-30 | Disposition: A | Discharge status: Home / Self Care | Payer: Medicare Other | Source: Ambulatory Visit | Attending: General Surgery | Admitting: General Surgery

## 2014-09-30 DIAGNOSIS — Z9889 Other specified postprocedural states: Secondary | ICD-10-CM

## 2014-09-30 DIAGNOSIS — R1033 Periumbilical pain: Secondary | ICD-10-CM

## 2014-09-30 MED ORDER — IOPAMIDOL (ISOVUE-300) INJECTION 61%: 100.0000 mL | Freq: Once | INTRAVENOUS | Status: DC | PRN

## 2014-10-01 DIAGNOSIS — K7469 Other cirrhosis of liver: Secondary | ICD-10-CM | POA: Diagnosis not present

## 2014-10-01 DIAGNOSIS — E119 Type 2 diabetes mellitus without complications: Secondary | ICD-10-CM | POA: Diagnosis not present

## 2014-10-01 DIAGNOSIS — T8131XD Disruption of external operation (surgical) wound, not elsewhere classified, subsequent encounter: Secondary | ICD-10-CM | POA: Diagnosis not present

## 2014-10-04 DIAGNOSIS — T8131XD Disruption of external operation (surgical) wound, not elsewhere classified, subsequent encounter: Secondary | ICD-10-CM | POA: Diagnosis not present

## 2014-10-04 DIAGNOSIS — E119 Type 2 diabetes mellitus without complications: Secondary | ICD-10-CM | POA: Diagnosis not present

## 2014-10-04 DIAGNOSIS — K7469 Other cirrhosis of liver: Secondary | ICD-10-CM | POA: Diagnosis not present

## 2014-10-06 ENCOUNTER — Encounter: Payer: Self-pay | Admitting: *Deleted

## 2014-10-06 DIAGNOSIS — T8131XD Disruption of external operation (surgical) wound, not elsewhere classified, subsequent encounter: Secondary | ICD-10-CM | POA: Diagnosis not present

## 2014-10-06 DIAGNOSIS — E119 Type 2 diabetes mellitus without complications: Secondary | ICD-10-CM | POA: Diagnosis not present

## 2014-10-06 DIAGNOSIS — T8189XA Other complications of procedures, not elsewhere classified, initial encounter: Secondary | ICD-10-CM | POA: Diagnosis not present

## 2014-10-06 DIAGNOSIS — K7469 Other cirrhosis of liver: Secondary | ICD-10-CM | POA: Diagnosis not present

## 2014-10-07 ENCOUNTER — Other Ambulatory Visit: Payer: Self-pay | Admitting: Internal Medicine

## 2014-10-07 DIAGNOSIS — K7469 Other cirrhosis of liver: Secondary | ICD-10-CM | POA: Diagnosis not present

## 2014-10-07 DIAGNOSIS — E119 Type 2 diabetes mellitus without complications: Secondary | ICD-10-CM | POA: Diagnosis not present

## 2014-10-07 DIAGNOSIS — T8131XD Disruption of external operation (surgical) wound, not elsewhere classified, subsequent encounter: Secondary | ICD-10-CM | POA: Diagnosis not present

## 2014-10-08 DIAGNOSIS — K7469 Other cirrhosis of liver: Secondary | ICD-10-CM | POA: Diagnosis not present

## 2014-10-08 DIAGNOSIS — T8131XD Disruption of external operation (surgical) wound, not elsewhere classified, subsequent encounter: Secondary | ICD-10-CM | POA: Diagnosis not present

## 2014-10-08 DIAGNOSIS — E119 Type 2 diabetes mellitus without complications: Secondary | ICD-10-CM | POA: Diagnosis not present

## 2014-10-11 DIAGNOSIS — E119 Type 2 diabetes mellitus without complications: Secondary | ICD-10-CM | POA: Diagnosis not present

## 2014-10-11 DIAGNOSIS — K7469 Other cirrhosis of liver: Secondary | ICD-10-CM | POA: Diagnosis not present

## 2014-10-11 DIAGNOSIS — T8131XD Disruption of external operation (surgical) wound, not elsewhere classified, subsequent encounter: Secondary | ICD-10-CM | POA: Diagnosis not present

## 2014-10-13 DIAGNOSIS — K7469 Other cirrhosis of liver: Secondary | ICD-10-CM | POA: Diagnosis not present

## 2014-10-13 DIAGNOSIS — T8131XD Disruption of external operation (surgical) wound, not elsewhere classified, subsequent encounter: Secondary | ICD-10-CM | POA: Diagnosis not present

## 2014-10-13 DIAGNOSIS — E119 Type 2 diabetes mellitus without complications: Secondary | ICD-10-CM | POA: Diagnosis not present

## 2014-10-15 DIAGNOSIS — K7469 Other cirrhosis of liver: Secondary | ICD-10-CM | POA: Diagnosis not present

## 2014-10-15 DIAGNOSIS — E119 Type 2 diabetes mellitus without complications: Secondary | ICD-10-CM | POA: Diagnosis not present

## 2014-10-15 DIAGNOSIS — T8131XD Disruption of external operation (surgical) wound, not elsewhere classified, subsequent encounter: Secondary | ICD-10-CM | POA: Diagnosis not present

## 2014-10-18 DIAGNOSIS — T8131XD Disruption of external operation (surgical) wound, not elsewhere classified, subsequent encounter: Secondary | ICD-10-CM | POA: Diagnosis not present

## 2014-10-18 DIAGNOSIS — E119 Type 2 diabetes mellitus without complications: Secondary | ICD-10-CM | POA: Diagnosis not present

## 2014-10-18 DIAGNOSIS — K7469 Other cirrhosis of liver: Secondary | ICD-10-CM | POA: Diagnosis not present

## 2014-10-19 ENCOUNTER — Other Ambulatory Visit: Payer: Self-pay | Admitting: *Deleted

## 2014-10-19 DIAGNOSIS — T8189XS Other complications of procedures, not elsewhere classified, sequela: Secondary | ICD-10-CM

## 2014-10-19 DIAGNOSIS — K439 Ventral hernia without obstruction or gangrene: Secondary | ICD-10-CM

## 2014-10-19 MED ORDER — OXYCODONE-ACETAMINOPHEN 10-325 MG PO TABS
1.0000 | ORAL_TABLET | Freq: Four times a day (QID) | ORAL | Status: DC | PRN
Start: 2014-10-19 — End: 2014-11-30

## 2014-10-19 NOTE — Telephone Encounter (Signed)
Last refill 4/29 Call when ready @ [facility] 5/2

## 2014-10-20 DIAGNOSIS — T8131XD Disruption of external operation (surgical) wound, not elsewhere classified, subsequent encounter: Secondary | ICD-10-CM | POA: Diagnosis not present

## 2014-10-20 DIAGNOSIS — K7469 Other cirrhosis of liver: Secondary | ICD-10-CM | POA: Diagnosis not present

## 2014-10-20 DIAGNOSIS — E119 Type 2 diabetes mellitus without complications: Secondary | ICD-10-CM | POA: Diagnosis not present

## 2014-10-20 NOTE — Telephone Encounter (Signed)
Pt called to inform Rx is ready but no answer and not able to leave message.

## 2014-10-22 ENCOUNTER — Ambulatory Visit (INDEPENDENT_AMBULATORY_CARE_PROVIDER_SITE_OTHER): Payer: Medicare Other | Admitting: Internal Medicine

## 2014-10-22 ENCOUNTER — Encounter: Payer: Self-pay | Admitting: Internal Medicine

## 2014-10-22 VITALS — BP 160/85 | HR 70 | Temp 98.4°F | Ht 63.0 in | Wt 163.5 lb

## 2014-10-22 DIAGNOSIS — I10 Essential (primary) hypertension: Secondary | ICD-10-CM

## 2014-10-22 DIAGNOSIS — F329 Major depressive disorder, single episode, unspecified: Secondary | ICD-10-CM | POA: Diagnosis not present

## 2014-10-22 DIAGNOSIS — R630 Anorexia: Secondary | ICD-10-CM

## 2014-10-22 DIAGNOSIS — K7469 Other cirrhosis of liver: Secondary | ICD-10-CM | POA: Diagnosis not present

## 2014-10-22 DIAGNOSIS — F17210 Nicotine dependence, cigarettes, uncomplicated: Secondary | ICD-10-CM | POA: Diagnosis not present

## 2014-10-22 DIAGNOSIS — T8189XS Other complications of procedures, not elsewhere classified, sequela: Secondary | ICD-10-CM

## 2014-10-22 DIAGNOSIS — T8131XD Disruption of external operation (surgical) wound, not elsewhere classified, subsequent encounter: Secondary | ICD-10-CM | POA: Diagnosis not present

## 2014-10-22 DIAGNOSIS — E119 Type 2 diabetes mellitus without complications: Secondary | ICD-10-CM | POA: Diagnosis present

## 2014-10-22 DIAGNOSIS — F32A Depression, unspecified: Secondary | ICD-10-CM

## 2014-10-22 MED ORDER — GABAPENTIN 400 MG PO CAPS: ORAL_CAPSULE | ORAL | Status: DC

## 2014-10-22 MED ORDER — POTASSIUM CHLORIDE 20 MEQ/15ML (10%) PO SOLN: ORAL | Status: DC

## 2014-10-22 MED ORDER — HYDROCHLOROTHIAZIDE 25 MG PO TABS: 25.0000 mg | ORAL_TABLET | Freq: Every morning | ORAL | Status: DC

## 2014-10-22 MED ORDER — METFORMIN HCL 500 MG PO TABS: ORAL_TABLET | ORAL | Status: DC

## 2014-10-22 MED ORDER — ENSURE ACTIVE HIGH PROTEIN PO LIQD: 1.0000 | Freq: Three times a day (TID) | ORAL | Status: DC

## 2014-10-22 MED ORDER — ESOMEPRAZOLE MAGNESIUM 40 MG PO CPDR: 40.0000 mg | DELAYED_RELEASE_CAPSULE | Freq: Two times a day (BID) | ORAL | Status: DC

## 2014-10-22 MED ORDER — LISINOPRIL 40 MG PO TABS: 40.0000 mg | ORAL_TABLET | Freq: Every morning | ORAL | Status: DC

## 2014-10-22 NOTE — Assessment & Plan Note (Signed)
Lab Results  Component Value Date   HGBA1C 5.6 08/13/2014   HGBA1C 5.6 05/14/2014   HGBA1C 6.1 12/18/2013     Assessment: Diabetes control:   Progress toward A1C goal:    Comments: pt has had significant weight loss and not taking her metformin  Plan: Medications:  Continue to take metformin and bring meter in next visit Home glucose monitoring: Frequency:   Timing:   Instruction/counseling given: discussed diet Educational resources provided: brochure (denies) Self management tools provided:   Other plans: f/u in 2-4 wks

## 2014-10-22 NOTE — Assessment & Plan Note (Signed)
>>  ASSESSMENT AND PLAN FOR TYPE 2 DIABETES MELLITUS WITH DIABETIC NEUROPATHY ([facility]) WRITTEN ON 10/22/2014  3:48 PM BY MONTY ALTAMESE HERO, MD  Lab Results  Component Value Date   HGBA1C 5.6 08/13/2014   HGBA1C 5.6 05/14/2014   HGBA1C 6.1 12/18/2013     Assessment: Diabetes control:   Progress toward A1C goal:    Comments: pt has had significant weight loss and not taking her metformin   Plan: Medications:  Continue to take metformin  and bring meter in next visit Home glucose monitoring: Frequency:   Timing:   Instruction/counseling given: discussed diet Educational resources provided: brochure (denies) Self management tools provided:   Other plans: f/u in 2-4 wks

## 2014-10-22 NOTE — Progress Notes (Signed)
Subjective:   Patient ID: [name] female   DOB: 05-23-1951 64 y.o.   MRN: [id number]  HPI: [name] is a 64 y.o. woman pmh as listed below presents for ongoing decreased appetite.   The patient is having ongoing frustrations with her poor and prolonged wound healing of her abdomen. She continues to require a wound VAC as has significant decreased appetite in the meantime. She denied any nausea vomiting or diarrhea. She has not been to a her medications stating that if she can't eat she can take her meds. She has been tolerating liquids such as soups and juices but does not really have an appetite for solid foods. She has had some weight loss in the setting of not eating and having multiple surgeries.  She is also reporting some feelings of depression although she denies any suicidal or homicidal ideation. She's had poor sleep, poor mood, poor medication ever since having her revision surgery earlier in 05/2014. She seems to be fully dependent on [facility] services in regards to her ADLs. She did also stop taking her and I depressed and Prozac along the same time she stopped taking her other medications.   Past Medical History  Diagnosis Date  . Hypertension   . Colon polyps   . Stomach problems   . Hypokalemic alkalosis   . Allergic rhinitis   . GERD (gastroesophageal reflux disease)   . Stroke 2010    no residual problems  . Anemia, iron deficiency 05/03/2011    recieves iron infusions  . Anemia   . GI bleeding   . Diverticulitis   . Transfusion history   . Difficulty sleeping     takes trazadone for sleep  . Peripheral vascular disease   . Hepatitis     hep C  . Anxiety   . Shortness of breath     due to anemia   Current Outpatient Prescriptions  Medication Sig Dispense Refill  . ACCU-CHEK FASTCLIX LANCETS MISC Check your blood sugar one time a day 102 each 5  . ALREX 0.2 % SUSP     . aspirin EC 81 MG tablet Take 81 mg by mouth daily.     Marland Kitchen atenolol (TENORMIN) 50  MG tablet Take 50 mg by mouth every morning.     . cetirizine (ZYRTEC) 10 MG tablet Take 10 mg by mouth every morning.     . cyclobenzaprine (FLEXERIL) 10 MG tablet Take 1 tablet (10 mg total) by mouth 2 (two) times daily as needed for muscle spasms. 20 tablet 0  . esomeprazole (NEXIUM) 40 MG capsule Take 40 mg by mouth 2 (two) times daily.    . fluorometholone (FML) 0.1 % ophthalmic suspension Place 1 drop into the left eye 2 (two) times daily.     Marland Kitchen FLUoxetine (PROZAC) 40 MG capsule Take 1 capsule (40 mg total) by mouth daily. 30 capsule 4  . gabapentin (NEURONTIN) 400 MG capsule Take 800 mg by mouth 3 (three) times daily.    Marland Kitchen gabapentin (NEURONTIN) 400 MG capsule TAKE 2 CAPSULE BY MOUTH THREE TIMES A DAY 180 capsule 1  . glucose blood (ACCU-CHEK SMARTVIEW) test strip Check blood sugar  one time a day 50 each 12  . hydrochlorothiazide (HYDRODIURIL) 25 MG tablet Take 25 mg by mouth every morning.    . lactose free nutrition (BOOST PLUS) LIQD Take 237 mLs by mouth 3 (three) times daily with meals. 237 mL 0  . lisinopril (PRINIVIL,ZESTRIL) 40 MG tablet Take  1 tablet (40 mg total) by mouth every morning. 90 tablet 4  . metFORMIN (GLUCOPHAGE) 500 MG tablet TAKE 1 TABLET BY MOUTH EVERY DAY WITH BREAKFAST 90 tablet 5  . oxyCODONE-acetaminophen (PERCOCET) 10-325 MG per tablet Take 1 tablet by mouth every 6 (six) hours as needed for pain. 120 tablet 0  . potassium chloride 20 MEQ/15ML (10%) SOLN TAKE 15ML BY MOUTH EVERY DAY 450 mL 1  . prednisoLONE acetate (PRED FORTE) 1 % ophthalmic suspension Place 1 drop into the right eye daily.     Marland Kitchen Specialty Vitamins Products (MAGNESIUM, AMINO ACID CHELATE,) 133 MG tablet Take 1 tablet by mouth 2 (two) times daily.    . traZODone (DESYREL) 100 MG tablet Take 100 mg by mouth at bedtime.    . vitamin C (ASCORBIC ACID) 500 MG tablet Take 500 mg by mouth every morning.     No current facility-administered medications for this visit.   Family History  Problem  Relation Age of Onset  . Cancer Father     unknown  . Hypertension Mother   . Diverticulitis Mother    History   Social History  . Marital Status: Widowed    Spouse Name: N/A  . Number of Children: N/A  . Years of Education: N/A   Social History Main Topics  . Smoking status: Current Every Day Smoker -- 0.20 packs/day for 17 years    Types: Cigarettes  . Smokeless tobacco: Never Used     Comment: wants to quit.  Smoking less 1 pk per week.  1-2 cigs per day  . Alcohol Use: 0.0 oz/week    0 Standard drinks or equivalent per week     Comment: Occasionally  . Drug Use: No     Comment: Thanksgiving and Christmas  Holidays 2014 hasn't had any since  . Sexual Activity: Not on file   Other Topics Concern  . None   Social History Narrative   Review of Systems: Pertinent items are noted in HPI. Objective:  Physical Exam: Filed Vitals:   10/22/14 1433  BP: 160/85  Pulse: 70  Temp: 98.4 F (36.9 C)  TempSrc: Oral  Height: 5\' 3"  (1.6 m)  Weight: 163 lb 8 oz (74.163 kg)  SpO2: 99%   General: sitting in chair, NAD  Cardiac: RRR, no rubs, murmurs or gallops Pulm: clear to auscultation bilaterally, moving normal volumes of air Abd: soft, wound vac in place, nondistended, BS present Ext: warm and well perfused, no pedal edema  Assessment & Plan:  Please see problem oriented charting  Pt discussed with Dr. Lynnae January

## 2014-10-22 NOTE — Assessment & Plan Note (Signed)
The patient seems very apathetic towards her care and her own personal health. Reports poor mood and poor sleep. The patient had previously been on Prozac with good response but has not been taking that recently. -Refill Prozac that does have some appetite stimulant if not could consider Remeron -Follow-up in 4-6 weeks

## 2014-10-22 NOTE — Patient Instructions (Signed)
General Instructions:   Please try to bring all your medicines next time. This will help [place] keep you safe from mistakes.   Please take all your medicines.   We are going to send ensure to the pharmacy and try to help you eat.    Progress Toward Treatment Goals:  Treatment Goal 05/14/2014  Blood pressure -  Stop smoking -  Prevent falls at goal    Self Care Goals & Plans:  Self Care Goal 10/22/2014  Manage my medications take my medicines as prescribed; refill my medications on time; bring my medications to every visit  Monitor my health -  Eat healthy foods drink diet soda or water instead of juice or soda; eat more vegetables; eat foods that are low in salt; eat baked foods instead of fried foods; eat fruit for snacks and desserts  Be physically active -  Stop smoking -  Prevent falls -    No flowsheet data found.   Care Management & Community Referrals:  Referral 05/14/2014  Referrals made for care management support [facility] case management program

## 2014-10-22 NOTE — Assessment & Plan Note (Signed)
Wt Readings from Last 3 Encounters:  10/22/14 163 lb 8 oz (74.163 kg)  09/02/14 170 lb (77.111 kg)  08/13/14 168 lb 11.2 oz (76.522 kg)   This is likely multifactorial in the setting of her multiple abdominal surgeries and probably compounded by her underlying untreated depression. Most recent labs did not show any significant abnormality in the patient had previous he had diarrhea but that has now resolved. The patient does seem to be tolerating liquid foods. Patient has also had recent abdominal imaging that did not show any other underlying pathology.  -ensure and boost prescriptions for nutritional supplementation -will continue to monitor may need to be seen by GI if continues to have problems ? Bacterial overgrowth in the setting of long course post op of Abx or malabsorption syndrome

## 2014-10-22 NOTE — Assessment & Plan Note (Signed)
BP Readings from Last 3 Encounters:  10/22/14 160/85  09/27/14 136/75  09/24/14 153/88    Lab Results  Component Value Date   NA 141 09/27/2014   K 4.3 09/27/2014   CREATININE 0.81 09/27/2014    Assessment: Blood pressure control:   Progress toward BP goal:    Comments: pt has not been taking her medications   Plan: Medications:  Continue HCTZ 25 mg, lisinopril 40 mg, and atenolol 50 mg Educational resources provided: brochure (denies) Self management tools provided:   Other plans: f/u in 2-4wks

## 2014-10-24 ENCOUNTER — Other Ambulatory Visit: Payer: Self-pay

## 2014-10-24 ENCOUNTER — Encounter (HOSPITAL_COMMUNITY): Payer: Self-pay | Admitting: *Deleted

## 2014-10-24 ENCOUNTER — Emergency Department (HOSPITAL_COMMUNITY)
Admission: EM | Admit: 2014-10-24 | Discharge: 2014-10-24 | Disposition: A | Discharge status: Home / Self Care | Payer: Medicare Other | Attending: Emergency Medicine | Admitting: Emergency Medicine

## 2014-10-24 ENCOUNTER — Emergency Department (HOSPITAL_COMMUNITY): Payer: Medicare Other

## 2014-10-24 DIAGNOSIS — D509 Iron deficiency anemia, unspecified: Secondary | ICD-10-CM | POA: Diagnosis not present

## 2014-10-24 DIAGNOSIS — Z720 Tobacco use: Secondary | ICD-10-CM | POA: Diagnosis not present

## 2014-10-24 DIAGNOSIS — Z86010 Personal history of colonic polyps: Secondary | ICD-10-CM | POA: Diagnosis not present

## 2014-10-24 DIAGNOSIS — Z9889 Other specified postprocedural states: Secondary | ICD-10-CM | POA: Diagnosis not present

## 2014-10-24 DIAGNOSIS — R1033 Periumbilical pain: Secondary | ICD-10-CM | POA: Diagnosis not present

## 2014-10-24 DIAGNOSIS — Z79899 Other long term (current) drug therapy: Secondary | ICD-10-CM | POA: Diagnosis not present

## 2014-10-24 DIAGNOSIS — G8918 Other acute postprocedural pain: Secondary | ICD-10-CM | POA: Diagnosis not present

## 2014-10-24 DIAGNOSIS — Z880 Allergy status to penicillin: Secondary | ICD-10-CM | POA: Insufficient documentation

## 2014-10-24 DIAGNOSIS — K219 Gastro-esophageal reflux disease without esophagitis: Secondary | ICD-10-CM | POA: Insufficient documentation

## 2014-10-24 DIAGNOSIS — Z7952 Long term (current) use of systemic steroids: Secondary | ICD-10-CM | POA: Diagnosis not present

## 2014-10-24 DIAGNOSIS — I10 Essential (primary) hypertension: Secondary | ICD-10-CM | POA: Insufficient documentation

## 2014-10-24 DIAGNOSIS — G479 Sleep disorder, unspecified: Secondary | ICD-10-CM | POA: Diagnosis not present

## 2014-10-24 DIAGNOSIS — R109 Unspecified abdominal pain: Secondary | ICD-10-CM

## 2014-10-24 DIAGNOSIS — Z7982 Long term (current) use of aspirin: Secondary | ICD-10-CM | POA: Insufficient documentation

## 2014-10-24 DIAGNOSIS — Z8619 Personal history of other infectious and parasitic diseases: Secondary | ICD-10-CM | POA: Insufficient documentation

## 2014-10-24 DIAGNOSIS — E876 Hypokalemia: Secondary | ICD-10-CM | POA: Diagnosis not present

## 2014-10-24 DIAGNOSIS — F419 Anxiety disorder, unspecified: Secondary | ICD-10-CM | POA: Insufficient documentation

## 2014-10-24 DIAGNOSIS — Z8673 Personal history of transient ischemic attack (TIA), and cerebral infarction without residual deficits: Secondary | ICD-10-CM | POA: Diagnosis not present

## 2014-10-24 DIAGNOSIS — E119 Type 2 diabetes mellitus without complications: Secondary | ICD-10-CM | POA: Diagnosis not present

## 2014-10-24 DIAGNOSIS — R1032 Left lower quadrant pain: Secondary | ICD-10-CM | POA: Diagnosis not present

## 2014-10-24 DIAGNOSIS — R1031 Right lower quadrant pain: Secondary | ICD-10-CM | POA: Diagnosis present

## 2014-10-24 DIAGNOSIS — Z90710 Acquired absence of both cervix and uterus: Secondary | ICD-10-CM | POA: Diagnosis not present

## 2014-10-24 DIAGNOSIS — Z09 Encounter for follow-up examination after completed treatment for conditions other than malignant neoplasm: Secondary | ICD-10-CM

## 2014-10-24 HISTORY — DX: Type 2 diabetes mellitus without complications: E11.9

## 2014-10-24 LAB — COMPREHENSIVE METABOLIC PANEL
ALBUMIN: 3.1 g/dL — AB (ref 3.5–5.0)
ALT: 34 U/L (ref 14–54)
AST: 62 U/L — AB (ref 15–41)
Alkaline Phosphatase: 75 U/L (ref 38–126)
Anion gap: 9 (ref 5–15)
BUN: 7 mg/dL (ref 6–20)
CALCIUM: 8.6 mg/dL — AB (ref 8.9–10.3)
CO2: 33 mmol/L — AB (ref 22–32)
CREATININE: 0.7 mg/dL (ref 0.44–1.00)
Chloride: 96 mmol/L — ABNORMAL LOW (ref 101–111)
GFR calc non Af Amer: >60 mL/min (ref >60)
Glucose, Bld: 73 mg/dL (ref 65–99)
Potassium: 2.8 mmol/L — ABNORMAL LOW (ref 3.5–5.1)
SODIUM: 138 mmol/L (ref 135–145)
Total Bilirubin: 0.5 mg/dL (ref 0.3–1.2)
Total Protein: 6.9 g/dL (ref 6.5–8.1)

## 2014-10-24 LAB — I-STAT CG4 LACTIC ACID, ED
LACTIC ACID, VENOUS: 2.21 mmol/L — AB (ref 0.5–2.0)
LACTIC ACID, VENOUS: 1.95 mmol/L (ref 0.5–2.0)

## 2014-10-24 LAB — CBC WITH DIFFERENTIAL/PLATELET
Basophils Absolute: 0 10*3/uL (ref 0.0–0.1)
Basophils Relative: 0 % (ref 0–1)
Eosinophils Absolute: 0.1 10*3/uL (ref 0.0–0.7)
Eosinophils Relative: 2 % (ref 0–5)
HEMATOCRIT: 35.7 % — AB (ref 36.0–46.0)
HEMOGLOBIN: 11.9 g/dL — AB (ref 12.0–15.0)
LYMPHS PCT: 37 % (ref 12–46)
Lymphs Abs: 2.2 10*3/uL (ref 0.7–4.0)
MCH: 29.2 pg (ref 26.0–34.0)
MCHC: 33.3 g/dL (ref 30.0–36.0)
MCV: 87.7 fL (ref 78.0–100.0)
Monocytes Absolute: 0.7 10*3/uL (ref 0.1–1.0)
Monocytes Relative: 11 % (ref 3–12)
Neutro Abs: 3 10*3/uL (ref 1.7–7.7)
Neutrophils Relative %: 50 % (ref 43–77)
PLATELETS: 259 10*3/uL (ref 150–400)
RBC: 4.07 MIL/uL (ref 3.87–5.11)
RDW: 15.3 % (ref 11.5–15.5)
WBC: 6 10*3/uL (ref 4.0–10.5)

## 2014-10-24 LAB — URINALYSIS, ROUTINE W REFLEX MICROSCOPIC
Bilirubin Urine: NEGATIVE
GLUCOSE, UA: NEGATIVE mg/dL
HGB URINE DIPSTICK: NEGATIVE
Ketones, ur: NEGATIVE mg/dL
LEUKOCYTES UA: NEGATIVE
Nitrite: NEGATIVE
Protein, ur: NEGATIVE mg/dL
Specific Gravity, Urine: 1.015 (ref 1.005–1.030)
Urobilinogen, UA: 1 mg/dL (ref 0.0–1.0)
pH: 7 (ref 5.0–8.0)

## 2014-10-24 LAB — CBG MONITORING, ED: Glucose-Capillary: 78 mg/dL (ref 65–99)

## 2014-10-24 MED ORDER — POTASSIUM CHLORIDE 10 MEQ/100ML IV SOLN
10.0000 meq | Freq: Once | INTRAVENOUS | Status: AC
  Administered 2014-10-24: 10 meq via INTRAVENOUS
  Filled 2014-10-24: qty 100

## 2014-10-24 MED ORDER — FENTANYL CITRATE (PF) 100 MCG/2ML IJ SOLN
50.0000 ug | Freq: Once | INTRAMUSCULAR | Status: AC
  Administered 2014-10-24: 50 ug via INTRAVENOUS
  Filled 2014-10-24: qty 2

## 2014-10-24 MED ORDER — SODIUM CHLORIDE 0.9 % IV BOLUS (SEPSIS)
1000.0000 mL | Freq: Once | INTRAVENOUS | Status: AC
  Administered 2014-10-24: 1000 mL via INTRAVENOUS

## 2014-10-24 MED ORDER — LEVOFLOXACIN 500 MG PO TABS: 500.0000 mg | ORAL_TABLET | Freq: Once | ORAL | Status: DC

## 2014-10-24 MED ORDER — METRONIDAZOLE 500 MG PO TABS
2000.0000 mg | ORAL_TABLET | Freq: Once | ORAL | Status: AC
  Administered 2014-10-24: 2000 mg via ORAL
  Filled 2014-10-24: qty 4

## 2014-10-24 MED ORDER — POTASSIUM CHLORIDE CRYS ER 20 MEQ PO TBCR
40.0000 meq | EXTENDED_RELEASE_TABLET | Freq: Once | ORAL | Status: AC
  Administered 2014-10-24: 40 meq via ORAL
  Filled 2014-10-24: qty 2

## 2014-10-24 MED ORDER — IOHEXOL 300 MG/ML  SOLN
100.0000 mL | Freq: Once | INTRAMUSCULAR | Status: AC | PRN
  Administered 2014-10-24: 100 mL via INTRAVENOUS

## 2014-10-24 NOTE — ED Notes (Signed)
CT notified that patient has completed contrast

## 2014-10-24 NOTE — Progress Notes (Signed)
[facility] Attending  Case discussed with Dr. Sadek soon after the resident saw the patient.  We reviewed the resident's history and exam and pertinent patient test results.  I agree with the assessment, diagnosis, and plan of care documented in the resident's note. 

## 2014-10-24 NOTE — Discharge Instructions (Signed)
Your abdominal is likely related to your abdominal surgery.  No evidence of infection noted on CT scan.  Please follow up with your surgeon next week for further care.  Your potassium level is low today.  Eat plenty of banana or green leafy vegetable and follow up with your doctor for a recheck.  Return to ER if your condition worsen.

## 2014-10-24 NOTE — ED Notes (Signed)
Cosign Required    Expand All Collapse All   abd pain with wound vac. CT shows stranding suggestive of colitis. General Surgeon Dr. Grandville Silos recommend abx. Pt currently receiving Levaquin and Flagyl. Now waiting for repeat lactic acid, UA, fluid challenge. If pt able to tolerates then will d/c with levaquin/flagyl and f/u with Dr. Ninfa Linden next week.          4:56 PM Pharmacy notified that pt has allergies to cipro and anaphylaxis, therefore she cannot take Levaquin. Other option would be aztreonam. Therefore, will consult surgery for recommendation.          5:11 PM I have consult with Dr. Grandville Silos who have reviewed the CT scan results and does not patient has colitis and recommended against antibiotic.  5:29 PM Evidence of hypokalemia, potassium supplementation given in the ED. Patient is now able to tolerates PO. Patient feels better. Urine shows no evidence of urinary tract infection. Lactic acid level has improved. Suspect trace of dehydration which we have been addressed. At this time patient is stable for discharge with potassiums supplemenation. Patient will follow up outpatient for further care.  BP 146/88 mmHg  Pulse 80  Temp(Src) 98.1 F (36.7 C) (Oral)  Resp 18  SpO2 97%  I have reviewed nursing notes and vital signs. I personally reviewed the imaging tests through PACS system  I reviewed available ER/hospitalization records thought the EMR  Results for orders placed or performed during the hospital encounter of 10/24/14  CBC with Differential  Result Value Ref Range   WBC 6.0 4.0 - 10.5 K/uL   RBC 4.07 3.87 - 5.11 MIL/uL   Hemoglobin 11.9 (L) 12.0 - 15.0 g/dL   HCT 35.7 (L) 36.0 - 46.0 %   MCV 87.7 78.0 - 100.0 fL   MCH 29.2 26.0 - 34.0 pg   MCHC 33.3 30.0 - 36.0 g/dL   RDW 15.3 11.5 - 15.5 %   Platelets 259 150 - 400 K/uL   Neutrophils Relative % 50 43 - 77 %   Neutro Abs 3.0 1.7 - 7.7 K/uL   Lymphocytes Relative 37 12 - 46 %   Lymphs Abs  2.2 0.7 - 4.0 K/uL   Monocytes Relative 11 3 - 12 %   Monocytes Absolute 0.7 0.1 - 1.0 K/uL   Eosinophils Relative 2 0 - 5 %   Eosinophils Absolute 0.1 0.0 - 0.7 K/uL   Basophils Relative 0 0 - 1 %   Basophils Absolute 0.0 0.0 - 0.1 K/uL  Comprehensive metabolic panel  Result Value Ref Range   Sodium 138 135 - 145 mmol/L   Potassium 2.8 (L) 3.5 - 5.1 mmol/L   Chloride 96 (L) 101 - 111 mmol/L   CO2 33 (H) 22 - 32 mmol/L   Glucose, Bld 73 65 - 99 mg/dL   BUN 7 6 - 20 mg/dL   Creatinine, Ser 0.70 0.44 - 1.00 mg/dL   Calcium 8.6 (L) 8.9 - 10.3 mg/dL   Total Protein 6.9 6.5 - 8.1 g/dL   Albumin 3.1 (L) 3.5 - 5.0 g/dL   AST 62 (H) 15 - 41 U/L   ALT 34 14 - 54 U/L   Alkaline Phosphatase 75 38 - 126 U/L   Total Bilirubin 0.5 0.3 - 1.2 mg/dL   GFR calc non Af Amer >60 >60 mL/min   GFR calc Af Amer >60 >60 mL/min   Anion gap 9 5 - 15  Urinalysis, Routine w reflex microscopic  Result Value Ref Range  Color, Urine YELLOW YELLOW   APPearance CLEAR CLEAR   Specific Gravity, Urine 1.015 1.005 - 1.030   pH 7.0 5.0 - 8.0   Glucose, UA NEGATIVE NEGATIVE mg/dL   Hgb urine dipstick NEGATIVE NEGATIVE   Bilirubin Urine NEGATIVE NEGATIVE   Ketones, ur NEGATIVE NEGATIVE mg/dL   Protein, ur NEGATIVE NEGATIVE mg/dL   Urobilinogen, UA 1.0 0.0 - 1.0 mg/dL   Nitrite NEGATIVE NEGATIVE   Leukocytes, UA NEGATIVE NEGATIVE  I-Stat CG4 Lactic Acid, ED  Result Value Ref Range   Lactic Acid, Venous 2.21 (HH) 0.5 - 2.0 mmol/L   Comment NOTIFIED PHYSICIAN   I-Stat CG4 Lactic Acid, ED  Result Value Ref Range   Lactic Acid, Venous 1.95 0.5 - 2.0 mmol/L   Ct Abdomen Pelvis W Contrast  10/24/2014   CLINICAL DATA:  Umbilical pain, prior hernia surgery on 06/11/2014 with wound VAC. History of hepatitis C and diverticulitis. Status post partial colectomy and hysterectomy.  EXAM: CT ABDOMEN AND PELVIS WITH CONTRAST  TECHNIQUE: Multidetector CT imaging of the abdomen and pelvis was performed using the standard  protocol following bolus administration of intravenous contrast.  CONTRAST:  11mL OMNIPAQUE IOHEXOL 300 MG/ML  SOLN  COMPARISON:  09/29/2014  FINDINGS: Lower chest:  Scarring/atelectasis at the lung bases.  Hepatobiliary: Liver is within normal limits. No suspicious/ enhancing hepatic lesions.  Gallbladder is unremarkable. No intrahepatic or extrahepatic ductal dilatation.  Pancreas: Within normal limits.  Spleen: Within normal limits.  Adrenals/Urinary Tract: Adrenal glands are within normal limits.  Scattered small right renal cysts measuring up to 8 mm.  Left kidney is within normal limits.  No hydronephrosis.  Bladder is within normal limits.  Stomach/Bowel: Stomach is within normal limits.  No evidence of bowel obstruction.  Prior distal colonic resection with suture line in the left lower pelvis (series 3/ image 63). Associated moderate distal colonic stool burden.  Vascular/Lymphatic: Atherosclerotic calcifications of the abdominal aorta and branch vessels.  No suspicious abdominopelvic lymphadenopathy.  Reproductive: Status post hysterectomy.  No adnexal masses.  Other: Postsurgical changes in the anterior abdominal wall. Overlying wound VAC (series 3/image 57). Soft tissue stranding without discrete fluid collection/ abscess (series 3/ image 53).  Musculoskeletal: Degenerative changes of the lower thoracic spine.  IMPRESSION: No evidence of bowel obstruction.  Prior distal colonic resection. Moderate distal colonic stool burden.  Postsurgical changes in the anterior abdominal wall with overlying wound VAC. Soft tissue stranding without discrete fluid collection/abscess.   Electronically Signed   By: Julian Hy M.D.   On: 10/24/2014 15:46   Ct Abdomen Pelvis W Contrast  09/30/2014   CLINICAL DATA:  64 year old female with a history of right lower quadrant pain, swelling for 6 days. Prior surgery with current wound VAC.  EXAM: CT ABDOMEN AND PELVIS WITH CONTRAST  TECHNIQUE: Multidetector CT imaging  of the abdomen and pelvis was performed using the standard protocol following bolus administration of intravenous contrast.  CONTRAST:  IV contrast bolus  COMPARISON:  CT 08/12/2014  FINDINGS: Lower chest:  Superficial soft tissues of the chest wall unremarkable.  Heart size within normal limits.  Calcifications of the aortic valve.  Unremarkable appearance the distal esophagus.  No mediastinal adenopathy.  Respiratory motion somewhat limits evaluation of the lungs for small nodules. Linear atelectasis/scarring at the lung bases. No pleural fluid.  Abdomen/ pelvis:  Greatest cranial caudal span of the liver measures approximately 20 cm. No focal lesions identified. No intrahepatic ductal dilatation.  Unremarkable appearance of spleen.  Unremarkable appearance of  the bilateral adrenal glands.  Unremarkable pancreas.  Gallbladder is decompressed.  Unremarkable appearance the bilateral kidneys without hydronephrosis or nephrolithiasis.  Enteric contrast reaches the rectum. Fluid filled right colon and transverse colon. Partially formed stool within the rectum.  No abnormally distended small bowel. No distended colon. Appendix not visualized, though there are no inflammatory changes adjacent to the cecum.  No mesenteric adenopathy. No retroperitoneal adenopathy. No free fluid or significant free fluid. No inflammatory changes of the mesenteric.  Midline wound again demonstrated. Compared to the prior CT there is decreasing amount of subcutaneous gas within the planes of the surgical defect. Wound VAC still in place. At the inferior aspect of the wound there is a intermediate density collection measures 1.4 cm x 3.6 cm  Scattered calcifications of the vasculature. No aneurysm or dissection flap.  Unremarkable appearance of the venous system.  No displaced fracture. Vertebral body heights maintained. Mild degenerative disc disease.  IMPRESSION: No acute finding of the abdomen/pelvis.  Fluid filled colon, can be seen with  enteritis/ symptoms of diarrhea. Correlation with presentation may be useful.  Midline wound again demonstrated which appears to be improving from the comparison CT with decreasing degree of gas within the planes of the defect. There is a small intermediate density collection which may represent complex fluid or granulation tissue.  Atherosclerosis.  Additional incidental findings as above.  Signed,  Dulcy Fanny. Earleen Newport, DO  [facility]  [facility]   Electronically Signed   By: Corrie Mckusick D.O.   On: 09/30/2014 10:50      Domenic Moras, PA-C 10/24/14 1930  Orpah Greek, MD 10/24/14 [phone]

## 2014-10-24 NOTE — ED Notes (Signed)
Pt in CT.

## 2014-10-24 NOTE — ED Notes (Signed)
Made aware of lactic acid results and finding room for patient.

## 2014-10-24 NOTE — ED Notes (Signed)
Pt is here with umbilical sharp pains that started Friday nite and reports she was pouring blood from mouth, then reports that stopped.  Pt has a wound vac to umbilical area from hernia surgery on 06/01/14.  Pt now with intense pain, pt is diabetic and has not checked it in while.

## 2014-10-24 NOTE — ED Provider Notes (Addendum)
CSN: [id number]     Arrival date & time 10/24/14  1235 History   First MD Initiated Contact with Patient 10/24/14 1325     Chief Complaint  Patient presents with  . Abdominal Pain     (Consider location/radiation/quality/duration/timing/severity/associated sxs/prior Treatment) HPI  Surgeon; Dr. Coralie Keens PCP: [name], MD Blood pressure 147/90, pulse 80, temperature 98.8 F (37.1 C), temperature source Oral, resp. rate 18, SpO2 100 %.  [name] is a 64 y.o.female with a significant PMH of hypertension, history of stroke, history of GI bleeding, history of diverticulitis, hepatitis, anxiety, diabetes presents to the ER with complaints of abdominal pains. Patient reports having surgery to her abdomen due to recurrent diverticulitis which was done by Dr. Ninfa Linden. She had a wound VAC to placed and it has been there since January 2016. She reports healing has been going well until recently. The pains are sharp and were in the right lower quadrant for the first few days and then switched over to the left lower quadrant. She was seen by Dr. Ninfa Linden and an MRI was done to evaluate the right lower quadrant pain, this was normal. The pains then switched to the left lower quadrant and a CT scan of the abdomen and pelvis was done a few days ago which showed a mild infection to her bowels. She denies being started on any antibiotics because she had been on and about except last couple months. Yesterday she had an episode of vomiting 1. The vomiting had approximately one cup of bright red blood without any other obvious substance mixed with it. He has not had any more episodes of vomiting since yesterday or any bleeding. She reports having normal daily bowel movements which has not changed. Describes her pains as intermittent and shocklike. She has noticed purulent and bloody discharge coming from the wound VAC. He denies any fevers, chills, weakness, decreased appetite, headache, shortness of  breath, diaphoresis or any other associated symptoms. The patient appears to be in good spirits and is laughing and joking with staff.    Past Medical History  Diagnosis Date  . Hypertension   . Colon polyps   . Stomach problems   . Hypokalemic alkalosis   . Allergic rhinitis   . GERD (gastroesophageal reflux disease)   . Stroke 2010    no residual problems  . Anemia, iron deficiency 05/03/2011    recieves iron infusions  . Anemia   . GI bleeding   . Diverticulitis   . Transfusion history   . Difficulty sleeping     takes trazadone for sleep  . Peripheral vascular disease   . Hepatitis     hep C  . Anxiety   . Shortness of breath     due to anemia  . Diabetes mellitus without complication    Past Surgical History  Procedure Laterality Date  . Leg amputation above knee    . Esophagogastroduodenoscopy  05/05/2011    Procedure: ESOPHAGOGASTRODUODENOSCOPY (EGD);  Surgeon: Zenovia Jarred, MD;  Location: [facility];  Service: Gastroenterology;  Laterality: N/A;  Dr. Hilarie Fredrickson will do procedure for Dr. Benson Norway Saturday.  . Esophagogastroduodenoscopy  05/08/2011    Procedure: ESOPHAGOGASTRODUODENOSCOPY (EGD);  Surgeon: Beryle Beams;  Location: [facility];  Service: Endoscopy;  Laterality: N/A;  . Esophagogastroduodenoscopy  06/07/2011    Procedure: ESOPHAGOGASTRODUODENOSCOPY (EGD);  Surgeon: Beryle Beams, MD;  Location: [facility];  Service: Endoscopy;  Laterality: N/A;  . Hot hemostasis  06/07/2011    Procedure: HOT HEMOSTASIS (  ARGON PLASMA COAGULATION/BICAP);  Surgeon: Beryle Beams, MD;  Location: [facility];  Service: Endoscopy;  Laterality: N/A;  . Esophagogastroduodenoscopy  12/20/2011    Procedure: ESOPHAGOGASTRODUODENOSCOPY (EGD);  Surgeon: Beryle Beams, MD;  Location: [facility];  Service: Endoscopy;  Laterality: N/A;  . Flexible sigmoidoscopy  12/21/2011    Procedure: FLEXIBLE SIGMOIDOSCOPY;  Surgeon: Beryle Beams, MD;  Location: [facility];  Service: Endoscopy;   Laterality: N/A;  . Carpal tunnel release      rt hand  . Partial colectomy  02/15/2012    Procedure: PARTIAL COLECTOMY;  Surgeon: Harl Bowie, MD;  Location: [facility];  Service: General;  Laterality: N/A;  . Laparotomy  02/16/2012    Procedure: EXPLORATORY LAPAROTOMY;  Surgeon: Edward Jolly, MD;  Location: [facility];  Service: General;  Laterality: N/A;  oversewing of anastomotic leak and rigid sigmoidoscopy  . Enteroscopy N/A 12/04/2012    Procedure: ENTEROSCOPY;  Surgeon: Beryle Beams, MD;  Location: [facility];  Service: Endoscopy;  Laterality: N/A;  . Enteroscopy N/A 12/18/2012    Procedure: ENTEROSCOPY;  Surgeon: Beryle Beams, MD;  Location: [facility];  Service: Endoscopy;  Laterality: N/A;  . Eye surgery Bilateral     cataracts  . Tonsillectomy    . Abdominal hysterectomy    . Insertion of mesh N/A 06/01/2014    Procedure: INSERTION OF MESH;  Surgeon: Coralie Keens, MD;  Location: [place];  Service: General;  Laterality: N/A;  . Incisional hernia repair N/A 06/01/2014    Procedure: ATTEMPTED LAPAROSCOPIC AND OPEN INCISIONAL HERNIA REPAIR WITH MESH;  Surgeon: Coralie Keens, MD;  Location: [place];  Service: General;  Laterality: N/A;  . Laparotomy N/A 06/21/2014    Procedure: ABDOMINAL WOUND EXPLORATION;  Surgeon: Coralie Keens, MD;  Location: [place];  Service: General;  Laterality: N/A;  . Application of wound vac N/A 06/21/2014    Procedure: APPLICATION OF WOUND VAC;  Surgeon: Coralie Keens, MD;  Location: [place];  Service: General;  Laterality: N/A;   Family History  Problem Relation Age of Onset  . Cancer Father     unknown  . Hypertension Mother   . Diverticulitis Mother    History  Substance Use Topics  . Smoking status: Current Every Day Smoker -- 0.20 packs/day for 17 years    Types: Cigarettes  . Smokeless tobacco: Never Used     Comment: wants to quit.  Smoking less 1 pk per week.  1-2 cigs per day  . Alcohol Use: 0.0 oz/week    0 Standard drinks or  equivalent per week     Comment: Occasionally   OB History    No data available     Review of Systems  10 Systems reviewed and are negative for acute change except as noted in the HPI.     Allergies  Ciprofloxacin; Morphine and related; Penicillins; Codeine; and Feraheme  Home Medications   Prior to Admission medications   Medication Sig Start Date End Date Taking? Authorizing Provider  ACCU-CHEK FASTCLIX LANCETS MISC Check your blood sugar one time a day 04/08/14   Jerrye Noble, MD  ALREX 0.2 % SUSP  12/22/13   Historical Provider, MD  aspirin EC 81 MG tablet Take 81 mg by mouth daily.     Historical Provider, MD  atenolol (TENORMIN) 50 MG tablet Take 50 mg by mouth every morning.     Historical Provider, MD  cetirizine (ZYRTEC) 10 MG tablet Take 10 mg by mouth every morning.  Historical Provider, MD  esomeprazole (NEXIUM) 40 MG capsule Take 1 capsule (40 mg total) by mouth 2 (two) times daily. 10/22/14   Jerrye Noble, MD  fluorometholone (FML) 0.1 % ophthalmic suspension Place 1 drop into the left eye 2 (two) times daily.     Historical Provider, MD  FLUoxetine (PROZAC) 40 MG capsule Take 1 capsule (40 mg total) by mouth daily. 10/08/14   Jerrye Noble, MD  gabapentin (NEURONTIN) 400 MG capsule TAKE 2 CAPSULE BY MOUTH THREE TIMES A DAY 10/22/14   Jerrye Noble, MD  glucose blood (ACCU-CHEK SMARTVIEW) test strip Check blood sugar  one time a day 09/27/14   Jerrye Noble, MD  hydrochlorothiazide (HYDRODIURIL) 25 MG tablet Take 1 tablet (25 mg total) by mouth every morning. 10/22/14   Jerrye Noble, MD  lactose free nutrition (BOOST PLUS) LIQD Take 237 mLs by mouth 3 (three) times daily with meals. 10/16/13   Jerrye Noble, MD  lisinopril (PRINIVIL,ZESTRIL) 40 MG tablet Take 1 tablet (40 mg total) by mouth every morning. 10/22/14   Jerrye Noble, MD  metFORMIN (GLUCOPHAGE) 500 MG tablet TAKE 1 TABLET BY MOUTH EVERY DAY WITH BREAKFAST 10/22/14   Jerrye Noble, MD  Nutritional Supplements (ENSURE  ACTIVE HIGH PROTEIN) LIQD Take 1 Bottle by mouth 4 (four) times daily -  with meals and at bedtime. 10/22/14   Jerrye Noble, MD  oxyCODONE-acetaminophen (PERCOCET) 10-325 MG per tablet Take 1 tablet by mouth every 6 (six) hours as needed for pain. 10/19/14   Jerrye Noble, MD  potassium chloride 20 MEQ/15ML (10%) SOLN TAKE 15ML BY MOUTH EVERY DAY 10/22/14   Jerrye Noble, MD  prednisoLONE acetate (PRED FORTE) 1 % ophthalmic suspension Place 1 drop into the right eye daily.     Historical Provider, MD  Specialty Vitamins Products (MAGNESIUM, AMINO ACID CHELATE,) 133 MG tablet Take 1 tablet by mouth 2 (two) times daily.    Historical Provider, MD  traZODone (DESYREL) 100 MG tablet Take 100 mg by mouth at bedtime.    Historical Provider, MD  vitamin C (ASCORBIC ACID) 500 MG tablet Take 500 mg by mouth every morning.    Historical Provider, MD   BP 147/90 mmHg  Pulse 80  Temp(Src) 98.8 F (37.1 C) (Oral)  Resp 18  SpO2 100% Physical Exam  Constitutional: She appears well-developed and well-nourished. No distress.  HENT:  Head: Normocephalic and atraumatic.  Eyes: Pupils are equal, round, and reactive to light.  Neck: Normal range of motion. Neck supple.  Cardiovascular: Normal rate and regular rhythm.   Pulmonary/Chest: Effort normal.  Abdominal: Soft. Bowel sounds are normal. There is tenderness in the suprapubic area and left lower quadrant. There is no rebound and no guarding.  Wound vac appears to be functioning appropriately.  Neurological: She is alert.  Skin: Skin is warm and dry.  Nursing note and vitals reviewed.   ED Course  Procedures (including critical care time) Labs Review Labs Reviewed  CBC WITH DIFFERENTIAL/PLATELET - Abnormal; Notable for the following:    Hemoglobin 11.9 (*)    HCT 35.7 (*)    All other components within normal limits  I-STAT CG4 LACTIC ACID, ED - Abnormal; Notable for the following:    Lactic Acid, Venous 2.21 (*)    All other components within  normal limits  COMPREHENSIVE METABOLIC PANEL  URINALYSIS, ROUTINE W REFLEX MICROSCOPIC (NOT AT [facility])  CBG MONITORING, ED    Imaging Review No results  found.   EKG Interpretation None      MDM   Final diagnoses:  Abdominal pain    Patients lactic acid elevated at 2.21, she is afebrile, normal vitals signs.  Her potassium is low and will be replaced at 2.8. CT abd/pelv, shows likely collitis I spoke with Dr. Grandville Silos (General surgery) regarding patient findings, from a surgical standpoint he recommends  Cipro flagyl. Pt is allergic to Cipro, will give Levaquin and Flagyl.  Medications  potassium chloride SA (K-DUR,KLOR-CON) CR tablet 40 mEq (not administered)  metroNIDAZOLE (FLAGYL) tablet 2,000 mg (not administered)  levofloxacin (LEVAQUIN) tablet 500 mg (not administered)  sodium chloride 0.9 % bolus 1,000 mL (1,000 mLs Intravenous New Bag/Given 10/24/14 1343)  fentaNYL (SUBLIMAZE) injection 50 mcg (50 mcg Intravenous Given 10/24/14 1518)  iohexol (OMNIPAQUE) 300 MG/ML solution 100 mL (100 mLs Intravenous Contrast Given 10/24/14 1500)    Patients urinalysis still pending Will need repeat Lactic acid to evaluate for clearing, if UTI or lactic acid does not improve pt will need admission. At end of shift, patient hand off to Domenic Moras, PA-C.      Delos Haring, PA-C 11/10/14 2020  Sherwood Gambler, MD 11/11/14 [id number]

## 2014-10-24 NOTE — ED Notes (Signed)
Dr Regenia Skeeter made aware of elevated lactic acid

## 2014-10-25 DIAGNOSIS — E119 Type 2 diabetes mellitus without complications: Secondary | ICD-10-CM | POA: Diagnosis not present

## 2014-10-25 DIAGNOSIS — T8131XD Disruption of external operation (surgical) wound, not elsewhere classified, subsequent encounter: Secondary | ICD-10-CM | POA: Diagnosis not present

## 2014-10-25 DIAGNOSIS — K7469 Other cirrhosis of liver: Secondary | ICD-10-CM | POA: Diagnosis not present

## 2014-10-26 ENCOUNTER — Telehealth: Payer: Self-pay | Admitting: *Deleted

## 2014-10-26 LAB — GLUCOSE, CAPILLARY: Glucose-Capillary: 192 mg/dL — ABNORMAL HIGH (ref 65–99)

## 2014-10-26 NOTE — Telephone Encounter (Signed)
Received PA request from pt's pharmacy.  Contacted pt's insurance and was informed that medication is on the preferred list, but it was too early for a refill.  Insurance stated that rx can be filled on 10/30/2014. Pharmacy was contacted and made aware-pharmacy states pt last filled 30 day supply on May 11th at another [facility]   (written by [facility]) and didn't think that insurance will pay for medication a week early.  Pharmacy will place rx on hold and resubmit to insurance company when due.Despina Hidden Cassady5/31/201610:48 AM

## 2014-10-27 DIAGNOSIS — K7469 Other cirrhosis of liver: Secondary | ICD-10-CM | POA: Diagnosis not present

## 2014-10-27 DIAGNOSIS — T8131XD Disruption of external operation (surgical) wound, not elsewhere classified, subsequent encounter: Secondary | ICD-10-CM | POA: Diagnosis not present

## 2014-10-27 DIAGNOSIS — E119 Type 2 diabetes mellitus without complications: Secondary | ICD-10-CM | POA: Diagnosis not present

## 2014-10-29 DIAGNOSIS — E119 Type 2 diabetes mellitus without complications: Secondary | ICD-10-CM | POA: Diagnosis not present

## 2014-10-29 DIAGNOSIS — T8131XD Disruption of external operation (surgical) wound, not elsewhere classified, subsequent encounter: Secondary | ICD-10-CM | POA: Diagnosis not present

## 2014-10-29 DIAGNOSIS — K7469 Other cirrhosis of liver: Secondary | ICD-10-CM | POA: Diagnosis not present

## 2014-11-01 DIAGNOSIS — K7469 Other cirrhosis of liver: Secondary | ICD-10-CM | POA: Diagnosis not present

## 2014-11-01 DIAGNOSIS — T8131XD Disruption of external operation (surgical) wound, not elsewhere classified, subsequent encounter: Secondary | ICD-10-CM | POA: Diagnosis not present

## 2014-11-01 DIAGNOSIS — E119 Type 2 diabetes mellitus without complications: Secondary | ICD-10-CM | POA: Diagnosis not present

## 2014-11-02 DIAGNOSIS — T8189XD Other complications of procedures, not elsewhere classified, subsequent encounter: Secondary | ICD-10-CM | POA: Diagnosis not present

## 2014-11-03 ENCOUNTER — Other Ambulatory Visit: Payer: Self-pay | Admitting: Internal Medicine

## 2014-11-03 DIAGNOSIS — E119 Type 2 diabetes mellitus without complications: Secondary | ICD-10-CM | POA: Diagnosis not present

## 2014-11-03 DIAGNOSIS — K7469 Other cirrhosis of liver: Secondary | ICD-10-CM | POA: Diagnosis not present

## 2014-11-03 DIAGNOSIS — T8131XD Disruption of external operation (surgical) wound, not elsewhere classified, subsequent encounter: Secondary | ICD-10-CM | POA: Diagnosis not present

## 2014-11-10 DIAGNOSIS — E119 Type 2 diabetes mellitus without complications: Secondary | ICD-10-CM | POA: Diagnosis not present

## 2014-11-10 DIAGNOSIS — T8131XD Disruption of external operation (surgical) wound, not elsewhere classified, subsequent encounter: Secondary | ICD-10-CM | POA: Diagnosis not present

## 2014-11-10 DIAGNOSIS — K7469 Other cirrhosis of liver: Secondary | ICD-10-CM | POA: Diagnosis not present

## 2014-11-11 DIAGNOSIS — T8131XD Disruption of external operation (surgical) wound, not elsewhere classified, subsequent encounter: Secondary | ICD-10-CM | POA: Diagnosis not present

## 2014-11-11 DIAGNOSIS — E119 Type 2 diabetes mellitus without complications: Secondary | ICD-10-CM | POA: Diagnosis not present

## 2014-11-11 DIAGNOSIS — K7469 Other cirrhosis of liver: Secondary | ICD-10-CM | POA: Diagnosis not present

## 2014-11-17 DIAGNOSIS — T8131XD Disruption of external operation (surgical) wound, not elsewhere classified, subsequent encounter: Secondary | ICD-10-CM | POA: Diagnosis not present

## 2014-11-17 DIAGNOSIS — E119 Type 2 diabetes mellitus without complications: Secondary | ICD-10-CM | POA: Diagnosis not present

## 2014-11-17 DIAGNOSIS — K7469 Other cirrhosis of liver: Secondary | ICD-10-CM | POA: Diagnosis not present

## 2014-11-22 ENCOUNTER — Other Ambulatory Visit: Payer: Self-pay | Admitting: *Deleted

## 2014-11-22 DIAGNOSIS — T8189XS Other complications of procedures, not elsewhere classified, sequela: Secondary | ICD-10-CM

## 2014-11-22 DIAGNOSIS — K439 Ventral hernia without obstruction or gangrene: Secondary | ICD-10-CM

## 2014-11-22 NOTE — Telephone Encounter (Signed)
Just filled on 6/7 per narcotic database.  She is also getting refills from her surgeon for tramadol, which is a violation of our contract.  She will need an appointment in the next week (with a PGY2 or PGY3) in the clinic to come in and discuss her medication.  She needs to be getting controlled substances from one provider.  Please call patient and have her come in to see someone.   Thanks

## 2014-11-24 DIAGNOSIS — E119 Type 2 diabetes mellitus without complications: Secondary | ICD-10-CM | POA: Diagnosis not present

## 2014-11-24 DIAGNOSIS — K7469 Other cirrhosis of liver: Secondary | ICD-10-CM | POA: Diagnosis not present

## 2014-11-24 DIAGNOSIS — T8131XD Disruption of external operation (surgical) wound, not elsewhere classified, subsequent encounter: Secondary | ICD-10-CM | POA: Diagnosis not present

## 2014-11-25 NOTE — Telephone Encounter (Signed)
Saw Dr  Doristine Section note - talked with pt - appt made Dr Naaman Plummer 11/30/14 2:15PM.

## 2014-11-30 ENCOUNTER — Ambulatory Visit (INDEPENDENT_AMBULATORY_CARE_PROVIDER_SITE_OTHER): Payer: Medicare Other | Admitting: Internal Medicine

## 2014-11-30 ENCOUNTER — Encounter: Payer: Self-pay | Admitting: Internal Medicine

## 2014-11-30 VITALS — BP 169/84 | HR 75 | Temp 98.5°F

## 2014-11-30 DIAGNOSIS — R1033 Periumbilical pain: Secondary | ICD-10-CM

## 2014-11-30 DIAGNOSIS — E119 Type 2 diabetes mellitus without complications: Secondary | ICD-10-CM | POA: Diagnosis not present

## 2014-11-30 DIAGNOSIS — E1142 Type 2 diabetes mellitus with diabetic polyneuropathy: Secondary | ICD-10-CM | POA: Diagnosis present

## 2014-11-30 DIAGNOSIS — Z79899 Other long term (current) drug therapy: Secondary | ICD-10-CM | POA: Diagnosis not present

## 2014-11-30 DIAGNOSIS — Z0000 Encounter for general adult medical examination without abnormal findings: Secondary | ICD-10-CM | POA: Diagnosis not present

## 2014-11-30 DIAGNOSIS — Z8719 Personal history of other diseases of the digestive system: Secondary | ICD-10-CM

## 2014-11-30 DIAGNOSIS — E876 Hypokalemia: Secondary | ICD-10-CM

## 2014-11-30 DIAGNOSIS — I10 Essential (primary) hypertension: Secondary | ICD-10-CM

## 2014-11-30 DIAGNOSIS — B182 Chronic viral hepatitis C: Secondary | ICD-10-CM

## 2014-11-30 DIAGNOSIS — Z8639 Personal history of other endocrine, nutritional and metabolic disease: Secondary | ICD-10-CM | POA: Insufficient documentation

## 2014-11-30 DIAGNOSIS — R21 Rash and other nonspecific skin eruption: Secondary | ICD-10-CM

## 2014-11-30 DIAGNOSIS — F17210 Nicotine dependence, cigarettes, uncomplicated: Secondary | ICD-10-CM

## 2014-11-30 DIAGNOSIS — E559 Vitamin D deficiency, unspecified: Secondary | ICD-10-CM

## 2014-11-30 DIAGNOSIS — Z9889 Other specified postprocedural states: Secondary | ICD-10-CM

## 2014-11-30 DIAGNOSIS — R197 Diarrhea, unspecified: Secondary | ICD-10-CM

## 2014-11-30 HISTORY — DX: Personal history of other endocrine, nutritional and metabolic disease: Z86.39

## 2014-11-30 LAB — POCT GLYCOSYLATED HEMOGLOBIN (HGB A1C): HEMOGLOBIN A1C: 5.3

## 2014-11-30 LAB — PROTIME-INR
INR: 1.09 (ref 0.00–1.49)
PROTHROMBIN TIME: 14.3 s (ref 11.6–15.2)

## 2014-11-30 LAB — APTT: APTT: 41 s — AB (ref 24–37)

## 2014-11-30 LAB — GLUCOSE, CAPILLARY: Glucose-Capillary: 140 mg/dL — ABNORMAL HIGH (ref 65–99)

## 2014-11-30 MED ORDER — OXYCODONE-ACETAMINOPHEN 10-325 MG PO TABS: 1.0000 | ORAL_TABLET | Freq: Four times a day (QID) | ORAL | Status: DC | PRN

## 2014-11-30 NOTE — Patient Instructions (Signed)
-  Great job on your diabetes --your A1c is 5.3 which is at goal of less than 7. You don't need to check your blood sugar if you don't want to and can trial off the metformin to see if your diarrhea improves. -Will check your bloodwork today and call you with the results -Will send you to the infectious disease clinic for your hepatitis C infection and will call you for the ultrasound of your abdomen  -I refilled your pain medication  -Avoid sun exposure for your rash -Please come back in 3 months or sooner if you see the ID clinic    General Instructions:   Please bring your medicines with you each time you come to clinic.  Medicines may include prescription medications, over-the-counter medications, herbal remedies, eye drops, vitamins, or other pills.   Progress Toward Treatment Goals:  Treatment Goal 05/14/2014  Blood pressure -  Stop smoking -  Prevent falls at goal    Self Care Goals & Plans:  Self Care Goal 11/30/2014  Manage my medications take my medicines as prescribed; bring my medications to every visit; refill my medications on time; follow the sick day instructions if I am sick  Monitor my health keep track of my blood glucose; bring my glucose meter and log to each visit; keep track of my blood pressure; check my feet daily  Eat healthy foods eat more vegetables; eat baked foods instead of fried foods; eat fruit for snacks and desserts; eat smaller portions  Be physically active find an activity I enjoy  Stop smoking -  Prevent falls -    No flowsheet data found.   Care Management & Community Referrals:  Referral 05/14/2014  Referrals made for care management support [facility] case management program

## 2014-11-30 NOTE — Assessment & Plan Note (Addendum)
Assessment: Pt with hypokalemia in setting of diarrhea most likely due to metformin usage compliant with potassium supplementation who presents with no muscle cramping.   Plan:  -Continue potassium chloride 30 mEq daily and OTC magnesium -Obtain CMP and Mg level  -Pt instructed to perform trial off metformin 500 mg daily to see if diarrhea resolves

## 2014-11-30 NOTE — Assessment & Plan Note (Addendum)
Assessment: Pt with last A1c of 5.6 on 08/13/14 compliant with oral hypoglycemic therapy with no symptomatic hypoglycemia who presents with CBG of 140 and improved A1c of 5.3.  Plan:  -A1c 5.3 at goal <7, Pt instructed to perform trial off metformin 500 mg daily in setting of diarrhea and can stop checking blood sugars if she desires. -BP 169/84 not at goal <140/90, continue atenolol 50 mg daily, HCTZ 25 mg daily, and lisinopril 40 mg daily. Consider adding norvasc at next visit if uncontrolled.  -Obtain annual lipid panel, last LDL 41 at goal <100 not on statin therapy however 10-yr ASCVD risk of >7.5% with recommendations to initiate moderate to high intensity statin therapy -Last annual foot exam on 08/07/13, repeat at next visit -Pt has upcoming annual eye exam later this month  -Last annual microalbumin on 03/19/14 was normal -Continue gabapentin 800 mg TID for chronic peripheral neuropathy -BMI 28.97 not at goal <25, encourage weight loss

## 2014-11-30 NOTE — Assessment & Plan Note (Addendum)
Assessment:  Pt with chronic hepatitis C diagnosed in 1997 with liver biopsy in 2007 that revealed clinically grade 1 stage 1 disease with very early periportal fibrosis and  last VL of [id number] on 07/07/14 who presents with rash most likely due to PCT.   Plan:  -Obtain CMP, CBC, ANA, PT, PTT, HIV Ab, hepatitis C genotype, hepatitis A total, hepatitis B core Ab, hepatitis B surface Ab, hepatitis B surface Ag -Obtain complete US abdomen with elastography, if evidence of cirrhosis will need AFP testing   -Obtain plasma fractionated porphyrins  -Pt will need hepatitis A & B vaccination if not already immunized -Pt has had pneumococcal vaccination in 2012 -Refer to ID clinic (if pt has PCT, would ideally need treatment of PCT prior to HCV treatment)

## 2014-11-30 NOTE — Assessment & Plan Note (Signed)
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   Plan:  -Obtain serum fractionated porphyrins   -Pt instructed to avoid sun exposure -Due to active skin lesions, pt would ideally need to be treated for PCT before treatment for HCV

## 2014-11-30 NOTE — Assessment & Plan Note (Addendum)
-  Obtain screening 25-OH vitamin D level  -Inquire about tdap and zoster vaccinations at next visit  -Inquire about pap smear testing at next visit

## 2014-11-30 NOTE — Assessment & Plan Note (Signed)
>>  ASSESSMENT AND PLAN FOR TYPE 2 DIABETES MELLITUS WITH DIABETIC NEUROPATHY ([facility]) WRITTEN ON 11/30/2014  6:17 PM BY RABBANI, MARJAN, MD  Assessment: Pt with last A1c of 5.6 on 08/13/14 compliant with oral hypoglycemic therapy with no symptomatic hypoglycemia who presents with CBG of 140 and improved A1c of 5.3.  Plan:  -A1c 5.3 at goal <7, Pt instructed to perform trial off metformin  500 mg daily in setting of diarrhea and can stop checking blood sugars if she desires. -BP 169/84 not at goal <140/90, continue atenolol  50 mg daily, HCTZ 25 mg daily, and lisinopril  40 mg daily. Consider adding norvasc  at next visit if uncontrolled.  -Obtain annual lipid panel, last LDL 41 at goal <100 not on statin therapy however 10-yr ASCVD risk of >7.5% with recommendations to initiate moderate to high intensity statin therapy -Last annual foot exam on 08/07/13, repeat at next visit -Pt has upcoming annual eye exam later this month  -Last annual microalbumin on 03/19/14 was normal -Continue gabapentin  800 mg TID for chronic peripheral neuropathy -BMI 28.97 not at goal <25, encourage weight loss

## 2014-11-30 NOTE — Progress Notes (Signed)
Patient ID: [name], female   DOB: Jan 30, 1951, 64 y.o.   MRN: [id number]    Subjective:   Patient ID: [name] female   DOB: Apr 13, 1951 64 y.o.   MRN: [id number]  HPI: [name] is a 64 y.o. pleasant woman with past medical history of hypertension, non-insulin dependent Type 2 DM, hypokalemia, iron-deficiency anemia requiring frequent IV iron infusions, chronic untreated HCV infection, depression, CVA, and GERD who presents for routine follow-up visit.   Her last A1c was 5.6 on 08/13/14. She reports compliance with taking metformin 500 mg daily with possible diarrhea. She checks her blood sugars once daily and brought in her glucose meter which reveals blood sugar range of 107-203 with range of 136. She denies symptomatic hypoglycemia. She has chronic blurry vision, polyuria, polyphagia, and chronic peripheral neuropathy but denies right foot injury/ulcer. She is to see opthalmology later this month. She is unable to exercise due to left BKA but tries to follow a healthy diet. She reports her weight has been stable.    She reports compliance with taking atenolol, HCTZ, and lisinopril for hypertension. She has right LE edema but denies headache, chest pain, or lightheadedness. She is also on potassium chloride 30 mEq daily and OTC magnesium supplements. She denies muscle cramping.   She has untreated chronic hepatitis C with last VL of [id number] on 07/07/14 and unaware of her diagnosis. She had liver biopsy in 2007 that revealed clinically grade 1 stage 1 chronic hepatitis C with very early periportal fibrosis. Iron stain did not show increased hepatocellular iron. She has not been treated for it and unclear if immune to Heptatitis A & B. She had CT abdomen/pelvis with contrast on 10/24/14 that did not show liver abnormality. She denies jaundice, bleeding, or joint pain. She reports having a pruritic blistering-type  rash predominately on her upper extremities that is worse with sun  exposure. She also has the rash on her upper back and chest. She receives frequent IV iron infusions for chronic iron-deficiency anemia due to chronic GI loss thought to be due to AVM's and diverticulosis.    She has chronic abdominal pain since her ventral hernia repair in January of this year that was complicated by wound dehiscence requiring prolonged wound vac placement. She takes percocet as needed for pain which helps. She is to see Dr. Ninfa Linden for follow-up soon.       Past Medical History  Diagnosis Date  . Hypertension   . Colon polyps   . Stomach problems   . Hypokalemic alkalosis   . Allergic rhinitis   . GERD (gastroesophageal reflux disease)   . Stroke 2010    no residual problems  . Anemia, iron deficiency 05/03/2011    recieves iron infusions  . Anemia   . GI bleeding   . Diverticulitis   . Transfusion history   . Difficulty sleeping     takes trazadone for sleep  . Peripheral vascular disease   . Hepatitis     hep C  . Anxiety   . Shortness of breath     due to anemia  . Diabetes mellitus without complication    Current Outpatient Prescriptions  Medication Sig Dispense Refill  . ACCU-CHEK FASTCLIX LANCETS MISC Check your blood sugar one time a day 102 each 5  . ALREX 0.2 % SUSP     . aspirin EC 81 MG tablet Take 81 mg by mouth daily.     Marland Kitchen atenolol (TENORMIN) 50 MG tablet  Take 50 mg by mouth every morning.     Marland Kitchen atenolol (TENORMIN) 50 MG tablet TAKE 1 TABLET BY MOUTH EVERY MORNING 30 tablet 9  . cetirizine (ZYRTEC) 10 MG tablet Take 10 mg by mouth every morning.     Marland Kitchen esomeprazole (NEXIUM) 40 MG capsule Take 1 capsule (40 mg total) by mouth 2 (two) times daily. 90 capsule 3  . fluorometholone (FML) 0.1 % ophthalmic suspension Place 1 drop into the left eye 2 (two) times daily.     Marland Kitchen FLUoxetine (PROZAC) 40 MG capsule Take 1 capsule (40 mg total) by mouth daily. 30 capsule 4  . gabapentin (NEURONTIN) 400 MG capsule TAKE 2 CAPSULE BY MOUTH THREE TIMES A DAY 180  capsule 1  . glucose blood (ACCU-CHEK SMARTVIEW) test strip Check blood sugar  one time a day 50 each 12  . hydrochlorothiazide (HYDRODIURIL) 25 MG tablet Take 1 tablet (25 mg total) by mouth every morning. 90 tablet 3  . lactose free nutrition (BOOST PLUS) LIQD Take 237 mLs by mouth 3 (three) times daily with meals. 237 mL 0  . lisinopril (PRINIVIL,ZESTRIL) 40 MG tablet Take 1 tablet (40 mg total) by mouth every morning. 90 tablet 4  . metFORMIN (GLUCOPHAGE) 500 MG tablet TAKE 1 TABLET BY MOUTH EVERY DAY WITH BREAKFAST 90 tablet 5  . Nutritional Supplements (ENSURE ACTIVE HIGH PROTEIN) LIQD Take 1 Bottle by mouth 4 (four) times daily -  with meals and at bedtime. 237 mL 12  . oxyCODONE-acetaminophen (PERCOCET) 10-325 MG per tablet Take 1 tablet by mouth every 6 (six) hours as needed for pain. 120 tablet 0  . potassium chloride 20 MEQ/15ML (10%) SOLN TAKE 15ML BY MOUTH EVERY DAY 450 mL 1  . prednisoLONE acetate (PRED FORTE) 1 % ophthalmic suspension Place 1 drop into the right eye daily.     Marland Kitchen Specialty Vitamins Products (MAGNESIUM, AMINO ACID CHELATE,) 133 MG tablet Take 1 tablet by mouth 2 (two) times daily.    . traZODone (DESYREL) 100 MG tablet Take 100 mg by mouth at bedtime.    . vitamin C (ASCORBIC ACID) 500 MG tablet Take 500 mg by mouth every morning.     No current facility-administered medications for this visit.   Family History  Problem Relation Age of Onset  . Cancer Father     unknown  . Hypertension Mother   . Diverticulitis Mother    History   Social History  . Marital Status: Widowed    Spouse Name: N/A  . Number of Children: N/A  . Years of Education: N/A   Social History Main Topics  . Smoking status: Current Every Day Smoker -- 0.20 packs/day for 17 years    Types: Cigarettes  . Smokeless tobacco: Never Used     Comment: wants to quit.  Smoking less 1 pk per week.  1-2 cigs per day  . Alcohol Use: 0.0 oz/week    0 Standard drinks or equivalent per week      Comment: Occasionally  . Drug Use: No     Comment: Thanksgiving and Christmas  Holidays 2014 hasn't had any since  . Sexual Activity: Not on file   Other Topics Concern  . Not on file   Social History Narrative   Review of Systems: Review of Systems  Constitutional: Negative for fever, chills and weight loss.  Eyes: Positive for blurred vision (chronic).  Respiratory: Negative for cough, shortness of breath and wheezing.   Cardiovascular: Positive for leg swelling (right  LE). Negative for chest pain.  Gastrointestinal: Positive for abdominal pain (chronic since hernia repair) and diarrhea. Negative for nausea, vomiting, constipation and blood in stool.  Genitourinary: Negative for dysuria, urgency, frequency and hematuria.  Musculoskeletal: Negative for myalgias and joint pain.  Skin: Positive for itching and rash (on b/l UE, back, and chest).  Neurological: Positive for sensory change (chronic peripheral neuroparhy ). Negative for dizziness and headaches.    Objective:  Physical Exam: Filed Vitals:   11/30/14 1457  BP: 169/84  Pulse: 75  Temp: 98.5 F (36.9 C)  TempSrc: Oral  SpO2: 99%    Physical Exam  Constitutional: She appears well-developed and well-nourished. No distress.  HENT:  Head: Normocephalic and atraumatic.  Right Ear: External ear normal.  Left Ear: External ear normal.  Nose: Nose normal.  Mouth/Throat: Oropharynx is clear and moist. No oropharyngeal exudate.  Eyes: Conjunctivae and EOM are normal. Pupils are equal, round, and reactive to light. Right eye exhibits no discharge. Left eye exhibits no discharge. No scleral icterus.  Neck: Normal range of motion. Neck supple.  Cardiovascular: Normal rate, regular rhythm and normal heart sounds.   No murmur heard. Pulmonary/Chest: Effort normal and breath sounds normal. No respiratory distress. She has no wheezes. She has no rales.  Abdominal: Soft. Bowel sounds are normal. She exhibits no distension. There  is tenderness (periumbilical ). There is no rebound and no guarding.  CDI dressing over periumbilical region  Musculoskeletal: Normal range of motion. She exhibits edema (trace right LE). She exhibits no tenderness.  S/p left BKA  Skin: Skin is warm and dry. Rash (Please see picture below ) noted. She is not diaphoretic. No erythema. No pallor.  Psychiatric: She has a normal mood and affect. Her behavior is normal. Judgment and thought content normal.       Assessment & Plan:   Please see problem list for problem-based assessment and plan

## 2014-11-30 NOTE — Assessment & Plan Note (Signed)
Assessment: Pt with moderately well-controlled hypertension compliant with three-class (BB, ACEi, & diuretic) anti-hypertensive therapy who presents with blood pressure of 169/84.    Plan:  -BP 169/84 not at goal <140/90 -Continue atenolol 50 mg daily, HCTZ 25 mg daily, and lisinopril 40 mg daily. Consider adding norvasc at next visit if uncontrolled.  -Obtain CMP and TSH

## 2014-11-30 NOTE — Assessment & Plan Note (Addendum)
Assessment: Pt is s/p ventral hernia repair January 4034 complicated by wound dehiscence February 2016 who presents with periumbilical pain controlled with narcotic therapy.   Plan: -Pt to see surgeon Dr. Ninfa Linden for follow-up -Refill percocet 10-325 mg Q 6 hr PRN pain, pt to limit tylenol to 2 g daily in setting of liver disease

## 2014-12-01 ENCOUNTER — Encounter: Payer: Self-pay | Admitting: Internal Medicine

## 2014-12-01 DIAGNOSIS — T8189XD Other complications of procedures, not elsewhere classified, subsequent encounter: Secondary | ICD-10-CM | POA: Diagnosis not present

## 2014-12-01 LAB — LIPID PANEL
Cholesterol: 125 mg/dL (ref 0–200)
HDL: 22 mg/dL — AB (ref >=46)
LDL CALC: 70 mg/dL (ref 0–99)
Total CHOL/HDL Ratio: 5.7 Ratio
Triglycerides: 163 mg/dL — ABNORMAL HIGH (ref <150)
VLDL: 33 mg/dL (ref 0–40)

## 2014-12-01 LAB — CBC WITH DIFFERENTIAL/PLATELET
Basophils Absolute: 0 10*3/uL (ref 0.0–0.1)
Basophils Relative: 0 % (ref 0–1)
Eosinophils Absolute: 0.1 10*3/uL (ref 0.0–0.7)
Eosinophils Relative: 2 % (ref 0–5)
HCT: 33.8 % — ABNORMAL LOW (ref 36.0–46.0)
HEMOGLOBIN: 11.6 g/dL — AB (ref 12.0–15.0)
LYMPHS ABS: 2.4 10*3/uL (ref 0.7–4.0)
Lymphocytes Relative: 33 % (ref 12–46)
MCH: 30.4 pg (ref 26.0–34.0)
MCHC: 34.3 g/dL (ref 30.0–36.0)
MCV: 88.5 fL (ref 78.0–100.0)
MPV: 8.7 fL (ref 8.6–12.4)
Monocytes Absolute: 0.6 10*3/uL (ref 0.1–1.0)
Monocytes Relative: 8 % (ref 3–12)
Neutro Abs: 4.2 10*3/uL (ref 1.7–7.7)
Neutrophils Relative %: 57 % (ref 43–77)
PLATELETS: 278 10*3/uL (ref 150–400)
RBC: 3.82 MIL/uL — ABNORMAL LOW (ref 3.87–5.11)
RDW: 14.4 % (ref 11.5–15.5)
WBC: 7.4 10*3/uL (ref 4.0–10.5)

## 2014-12-01 LAB — ANA: ANA: POSITIVE — AB

## 2014-12-01 LAB — VITAMIN D 25 HYDROXY (VIT D DEFICIENCY, FRACTURES): Vit D, 25-Hydroxy: 10 ng/mL — ABNORMAL LOW (ref 30–100)

## 2014-12-01 LAB — ANTI-NUCLEAR AB-TITER (ANA TITER): ANA TITER 1: NEGATIVE (ref <1:40)

## 2014-12-01 LAB — COMPLETE METABOLIC PANEL WITH GFR
ALT: 37 U/L — ABNORMAL HIGH (ref 0–35)
AST: 53 U/L — ABNORMAL HIGH (ref 0–37)
Albumin: 3.5 g/dL (ref 3.5–5.2)
Alkaline Phosphatase: 77 U/L (ref 39–117)
BUN: 10 mg/dL (ref 6–23)
CHLORIDE: 101 meq/L (ref 96–112)
CO2: 23 mEq/L (ref 19–32)
Calcium: 8.3 mg/dL — ABNORMAL LOW (ref 8.4–10.5)
Creat: 0.65 mg/dL (ref 0.50–1.10)
GFR, Est African American: >89 mL/min
GLUCOSE: 123 mg/dL — AB (ref 70–99)
Potassium: 4.2 mEq/L (ref 3.5–5.3)
Sodium: 138 mEq/L (ref 135–145)
Total Bilirubin: 0.5 mg/dL (ref 0.2–1.2)
Total Protein: 6.7 g/dL (ref 6.0–8.3)

## 2014-12-01 LAB — HEPATITIS B SURFACE ANTIGEN: HEP B S AG: NEGATIVE

## 2014-12-01 LAB — HEPATITIS B CORE ANTIBODY, TOTAL: HEP B C TOTAL AB: NONREACTIVE

## 2014-12-01 LAB — TSH: TSH: 0.968 u[IU]/mL (ref 0.350–4.500)

## 2014-12-01 LAB — HEPATITIS A ANTIBODY, TOTAL: Hep A Total Ab: NONREACTIVE

## 2014-12-01 LAB — HEPATITIS B SURFACE ANTIBODY,QUALITATIVE: Hep B S Ab: NEGATIVE

## 2014-12-01 LAB — MAGNESIUM: MAGNESIUM: 1.2 mg/dL — AB (ref 1.5–2.5)

## 2014-12-01 LAB — HIV ANTIBODY (ROUTINE TESTING W REFLEX): HIV 1&2 Ab, 4th Generation: NONREACTIVE

## 2014-12-01 NOTE — Addendum Note (Signed)
Addended byJuluis Mire on: 12/01/2014 12:45 PM   Modules accepted: Orders

## 2014-12-01 NOTE — Progress Notes (Signed)
[facility] Clinic Attending  Case discussed with Dr. Rabbani soon after the resident saw the patient.  We reviewed the resident's history and exam and pertinent patient test results.  I agree with the assessment, diagnosis, and plan of care documented in the resident's note.  

## 2014-12-02 ENCOUNTER — Encounter: Payer: Self-pay | Admitting: Internal Medicine

## 2014-12-02 ENCOUNTER — Other Ambulatory Visit (HOSPITAL_BASED_OUTPATIENT_CLINIC_OR_DEPARTMENT_OTHER): Payer: Medicare Other

## 2014-12-02 DIAGNOSIS — B182 Chronic viral hepatitis C: Secondary | ICD-10-CM

## 2014-12-02 DIAGNOSIS — T8131XD Disruption of external operation (surgical) wound, not elsewhere classified, subsequent encounter: Secondary | ICD-10-CM | POA: Diagnosis not present

## 2014-12-02 DIAGNOSIS — E559 Vitamin D deficiency, unspecified: Secondary | ICD-10-CM | POA: Insufficient documentation

## 2014-12-02 DIAGNOSIS — K7469 Other cirrhosis of liver: Secondary | ICD-10-CM | POA: Diagnosis not present

## 2014-12-02 DIAGNOSIS — D500 Iron deficiency anemia secondary to blood loss (chronic): Secondary | ICD-10-CM

## 2014-12-02 DIAGNOSIS — R21 Rash and other nonspecific skin eruption: Secondary | ICD-10-CM | POA: Diagnosis not present

## 2014-12-02 DIAGNOSIS — E119 Type 2 diabetes mellitus without complications: Secondary | ICD-10-CM | POA: Diagnosis not present

## 2014-12-02 LAB — CBC & DIFF AND RETIC
BASO%: 0.4 % (ref 0.0–2.0)
BASOS ABS: 0 10*3/uL (ref 0.0–0.1)
EOS ABS: 0.2 10*3/uL (ref 0.0–0.5)
EOS%: 2.2 % (ref 0.0–7.0)
HCT: 35.1 % (ref 34.8–46.6)
HEMOGLOBIN: 11.7 g/dL (ref 11.6–15.9)
Immature Retic Fract: 8.5 % (ref 1.60–10.00)
LYMPH#: 2.3 10*3/uL (ref 0.9–3.3)
LYMPH%: 29.2 % (ref 14.0–49.7)
MCH: 30.6 pg (ref 25.1–34.0)
MCHC: 33.3 g/dL (ref 31.5–36.0)
MCV: 91.9 fL (ref 79.5–101.0)
MONO#: 1.1 10*3/uL — ABNORMAL HIGH (ref 0.1–0.9)
MONO%: 14.1 % — ABNORMAL HIGH (ref 0.0–14.0)
NEUT%: 54.1 % (ref 38.4–76.8)
NEUTROS ABS: 4.2 10*3/uL (ref 1.5–6.5)
Platelets: 259 10*3/uL (ref 145–400)
RBC: 3.82 10*6/uL (ref 3.70–5.45)
RDW: 14.3 % (ref 11.2–14.5)
RETIC %: 2.33 % — AB (ref 0.70–2.10)
Retic Ct Abs: 89.01 10*3/uL (ref 33.70–90.70)
WBC: 7.8 10*3/uL (ref 3.9–10.3)
nRBC: 0 % (ref 0–0)

## 2014-12-02 LAB — COMPREHENSIVE METABOLIC PANEL (CC13)
ALBUMIN: 3.3 g/dL — AB (ref 3.5–5.0)
ALK PHOS: 94 U/L (ref 40–150)
ALT: 48 U/L (ref 0–55)
AST: 64 U/L — ABNORMAL HIGH (ref 5–34)
Anion Gap: 9 mEq/L (ref 3–11)
BUN: 16.8 mg/dL (ref 7.0–26.0)
CO2: 24 mEq/L (ref 22–29)
Calcium: 9 mg/dL (ref 8.4–10.4)
Chloride: 109 mEq/L (ref 98–109)
Creatinine: 0.8 mg/dL (ref 0.6–1.1)
EGFR: 88 mL/min/{1.73_m2} — ABNORMAL LOW (ref >90)
Glucose: 123 mg/dl (ref 70–140)
POTASSIUM: 3.9 meq/L (ref 3.5–5.1)
SODIUM: 142 meq/L (ref 136–145)
TOTAL PROTEIN: 6.9 g/dL (ref 6.4–8.3)
Total Bilirubin: 0.37 mg/dL (ref 0.20–1.20)

## 2014-12-02 LAB — FERRITIN CHCC: FERRITIN: 40 ng/mL (ref 9–269)

## 2014-12-02 LAB — IRON AND TIBC CHCC
%SAT: 16 % — ABNORMAL LOW (ref 21–57)
Iron: 70 ug/dL (ref 41–142)
TIBC: 433 ug/dL (ref 236–444)
UIBC: 363 ug/dL (ref 120–384)

## 2014-12-02 MED ORDER — VITAMIN D (ERGOCALCIFEROL) 1.25 MG (50000 UNIT) PO CAPS: 50000.0000 [IU] | ORAL_CAPSULE | ORAL | Status: DC

## 2014-12-02 NOTE — Addendum Note (Signed)
Addended byJuluis Mire on: 12/02/2014 12:58 AM   Modules accepted: Orders

## 2014-12-06 DIAGNOSIS — K7469 Other cirrhosis of liver: Secondary | ICD-10-CM | POA: Diagnosis not present

## 2014-12-06 DIAGNOSIS — E119 Type 2 diabetes mellitus without complications: Secondary | ICD-10-CM | POA: Diagnosis not present

## 2014-12-06 DIAGNOSIS — T8131XD Disruption of external operation (surgical) wound, not elsewhere classified, subsequent encounter: Secondary | ICD-10-CM | POA: Diagnosis not present

## 2014-12-07 LAB — HEPATITIS C RNA QUANTITATIVE
HCV Quantitative Log: 6.05 {Log} — ABNORMAL HIGH (ref <1.18)
HCV Quantitative: 1109829 IU/mL — ABNORMAL HIGH (ref <15)

## 2014-12-08 ENCOUNTER — Telehealth: Payer: Self-pay | Admitting: *Deleted

## 2014-12-08 DIAGNOSIS — K7469 Other cirrhosis of liver: Secondary | ICD-10-CM | POA: Diagnosis not present

## 2014-12-08 DIAGNOSIS — E119 Type 2 diabetes mellitus without complications: Secondary | ICD-10-CM | POA: Diagnosis not present

## 2014-12-08 DIAGNOSIS — T8131XD Disruption of external operation (surgical) wound, not elsewhere classified, subsequent encounter: Secondary | ICD-10-CM | POA: Diagnosis not present

## 2014-12-08 LAB — HEPATITIS C GENOTYPE

## 2014-12-08 NOTE — Telephone Encounter (Signed)
Pr aware of [place] [facility] 12/30/14 11AM - NPO for 8 hours. Hilda Blades Kodie Kishi RN 12/08/14 11AM

## 2014-12-09 ENCOUNTER — Encounter: Payer: Self-pay | Admitting: Hematology

## 2014-12-09 ENCOUNTER — Ambulatory Visit (HOSPITAL_BASED_OUTPATIENT_CLINIC_OR_DEPARTMENT_OTHER): Payer: Medicare Other | Admitting: Hematology

## 2014-12-09 ENCOUNTER — Telehealth: Payer: Self-pay | Admitting: Hematology

## 2014-12-09 ENCOUNTER — Telehealth: Payer: Self-pay | Admitting: *Deleted

## 2014-12-09 VITALS — BP 163/84 | HR 85 | Temp 98.3°F | Resp 18 | Ht 63.0 in | Wt 156.4 lb

## 2014-12-09 DIAGNOSIS — Z720 Tobacco use: Secondary | ICD-10-CM

## 2014-12-09 DIAGNOSIS — D500 Iron deficiency anemia secondary to blood loss (chronic): Secondary | ICD-10-CM | POA: Diagnosis not present

## 2014-12-09 DIAGNOSIS — R740 Nonspecific elevation of levels of transaminase and lactic acid dehydrogenase [LDH]: Secondary | ICD-10-CM

## 2014-12-09 DIAGNOSIS — E119 Type 2 diabetes mellitus without complications: Secondary | ICD-10-CM | POA: Diagnosis not present

## 2014-12-09 DIAGNOSIS — B182 Chronic viral hepatitis C: Secondary | ICD-10-CM

## 2014-12-09 NOTE — Progress Notes (Signed)
[place] OFFICE PROGRESS NOTE   [name], MD [address] [place] [address]  DIAGNOSIS: Iron deficiency anemia due to chronic blood loss.   PROBLEM LIST:  1. Recurrent iron-deficiency anemia secondary to gastrointestinal bleeding (as noted above). Ms. Welte also has required red cell transfusions in the past, most recently early December 2012. She underwent a small bowel enteroscopy by Dr. Dan Humphreys at [facility] on 08/13/2011. The enteroscope was passed up to 6 feet into the jejunum. He saw multiple AVMs, which were ablated. Two of the large AVMs bled during APC. These were in the 4th part of the duodenum very close to the ligament of Treitz. The remainder of the AVMs were in the proximal jejunum. In addition to the small bowel AV malformations, which were treated with APC, he saw some small esophageal varices which were not bleeding and also evidence of mild portal hypertensive gastropathy.  2. History of duodenal arteriovenous malformation.  3. History of hepatitis C infection.  4. Gastroesophageal reflux disease.  5. Depression.  6. Hypertension.  7. History of right Bell's palsy.  8. Status post left above-knee amputation secondary to necrotizing fasciitis 03/06/2009.  9. Neurodermatitis.  10. Previous history of alcohol usage.  11. Admission to the hospital from 12/20/2011 through 12/25/2011 for hematochezia felt to be secondary to a diverticular bleed, requiring 2 units of packed red cells.  12. Diverticulosis.  13. Elevated alpha fetoprotein, 72.3 on 11/19/2011 and 76.0 on 01/11/2012.  14. Abnormal MRI of the liver from 11/28/2011 showing diffuse and markedly low signal intensity throughout the liver as well as diffuse low signal intensity in the adrenal glands. 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 There were no findings for cirrhosis, portal hypertension,  splenomegaly or ascites, not were there any worrisome enhancing liver lesions.  15. Systolic ejection murmur.   CHIEF COMPLAIN: Follow up anemia  PRIOR TREATMENT: The patient has been heavily dependent upon IV Feraheme. Over the past year or so she has been receiving about 1 Feraheme infusion monthly on average. The patient's most recent infusions of IV Feraheme 510 mg occurred on the following dates: 06/05/2011, 07/03/2011, 07/31/2011, 08/17/2011, 08/28/2011. The patient received IV Feraheme 1020 mg on 10/02/2011 01/30/2012, 06/06/2012 and 09/10/2012, 05/2013, 07/2013, 08/2013, 09/2013, 11/12/2013, 01/15/2014 and 03/16/2014, 09/16/2014 and 09/23/2014   CURRENT TREATMENT: As noted above. Feraheme prn.   INTERVAL HISTORY:  [name] 64 y.o. female with history of recurrent iron-deficiency anemia due to GI bleeding is here for follow-up. GI bleeding is probably due to AV malformations and diverticulosis. She presents to clinic by her self in a wheelchair.   Her abdominal wound is much better, the wound vacum was finally removed on 11/02/14 after 6 month of therapy. The wound is very small now, and she is changing dressing once week. She states her DM is well controlled, and she is scheduled to see a GI specialist in a few weeks. She has some right shoudler pain, no nausea or other complains. She lives alone, uses wheelchair most of time,  has a care giver at home, she has moderate fatigue, able to do some selfcare at home.     MEDICAL HISTORY: Past Medical History  Diagnosis Date  . Hypertension   . Colon polyps   . Stomach problems   . Hypokalemic alkalosis   . Allergic rhinitis   . GERD (gastroesophageal reflux disease)   . Stroke 2010  no residual problems  . Anemia, iron deficiency 05/03/2011    recieves iron infusions  . Anemia   . GI bleeding   . Diverticulitis   . Transfusion history   . Difficulty sleeping     takes trazadone for sleep  . Peripheral vascular disease   .  Hepatitis     hep C  . Anxiety   . Shortness of breath     due to anemia  . Diabetes mellitus without complication      ALLERGIES:  is allergic to ciprofloxacin; morphine and related; penicillins; codeine; and feraheme.  MEDICATIONS: has a current medication list which includes the following prescription(s): accu-chek fastclix lancets, alrex, aspirin ec, atenolol, cetirizine, esomeprazole, fluorometholone, fluoxetine, gabapentin, glucose blood, hydrochlorothiazide, lactose free nutrition, lisinopril, metformin, ensure active high protein, oxycodone-acetaminophen, potassium chloride, prednisolone acetate, magnesium (amino acid chelate), trazodone, vitamin c, and vitamin d (ergocalciferol).  SURGICAL HISTORY:  Past Surgical History  Procedure Laterality Date  . Leg amputation above knee    . Esophagogastroduodenoscopy  05/05/2011    Procedure: ESOPHAGOGASTRODUODENOSCOPY (EGD);  Surgeon: Zenovia Jarred, MD;  Location: [facility];  Service: Gastroenterology;  Laterality: N/A;  Dr. Hilarie Fredrickson will do procedure for Dr. Benson Norway Saturday.  . Esophagogastroduodenoscopy  05/08/2011    Procedure: ESOPHAGOGASTRODUODENOSCOPY (EGD);  Surgeon: Beryle Beams;  Location: [facility];  Service: Endoscopy;  Laterality: N/A;  . Esophagogastroduodenoscopy  06/07/2011    Procedure: ESOPHAGOGASTRODUODENOSCOPY (EGD);  Surgeon: Beryle Beams, MD;  Location: [facility];  Service: Endoscopy;  Laterality: N/A;  . Hot hemostasis  06/07/2011    Procedure: HOT HEMOSTASIS (ARGON PLASMA COAGULATION/BICAP);  Surgeon: Beryle Beams, MD;  Location: [facility];  Service: Endoscopy;  Laterality: N/A;  . Esophagogastroduodenoscopy  12/20/2011    Procedure: ESOPHAGOGASTRODUODENOSCOPY (EGD);  Surgeon: Beryle Beams, MD;  Location: [facility];  Service: Endoscopy;  Laterality: N/A;  . Flexible sigmoidoscopy  12/21/2011    Procedure: FLEXIBLE SIGMOIDOSCOPY;  Surgeon: Beryle Beams, MD;  Location: [facility];  Service: Endoscopy;   Laterality: N/A;  . Carpal tunnel release      rt hand  . Partial colectomy  02/15/2012    Procedure: PARTIAL COLECTOMY;  Surgeon: Harl Bowie, MD;  Location: [facility];  Service: General;  Laterality: N/A;  . Laparotomy  02/16/2012    Procedure: EXPLORATORY LAPAROTOMY;  Surgeon: Edward Jolly, MD;  Location: [facility];  Service: General;  Laterality: N/A;  oversewing of anastomotic leak and rigid sigmoidoscopy  . Enteroscopy N/A 12/04/2012    Procedure: ENTEROSCOPY;  Surgeon: Beryle Beams, MD;  Location: [facility];  Service: Endoscopy;  Laterality: N/A;  . Enteroscopy N/A 12/18/2012    Procedure: ENTEROSCOPY;  Surgeon: Beryle Beams, MD;  Location: [facility];  Service: Endoscopy;  Laterality: N/A;  . Eye surgery Bilateral     cataracts  . Tonsillectomy    . Abdominal hysterectomy    . Insertion of mesh N/A 06/01/2014    Procedure: INSERTION OF MESH;  Surgeon: Coralie Keens, MD;  Location: [place];  Service: General;  Laterality: N/A;  . Incisional hernia repair N/A 06/01/2014    Procedure: ATTEMPTED LAPAROSCOPIC AND OPEN INCISIONAL HERNIA REPAIR WITH MESH;  Surgeon: Coralie Keens, MD;  Location: [place];  Service: General;  Laterality: N/A;  . Laparotomy N/A 06/21/2014    Procedure: ABDOMINAL WOUND EXPLORATION;  Surgeon: Coralie Keens, MD;  Location: [place];  Service: General;  Laterality: N/A;  . Application of wound vac N/A 06/21/2014    Procedure: APPLICATION  OF WOUND VAC;  Surgeon: Coralie Keens, MD;  Location: [place];  Service: General;  Laterality: N/A;     Medication List       This list is accurate as of: 12/09/14  8:36 AM.  Always use your most recent med list.               ACCU-CHEK FASTCLIX LANCETS Misc  Check your blood sugar one time a day     ALREX 0.2 % Susp  Generic drug:  loteprednol     aspirin EC 81 MG tablet  Take 81 mg by mouth daily.     atenolol 50 MG tablet  Commonly known as:  TENORMIN  TAKE 1 TABLET BY MOUTH EVERY MORNING      cetirizine 10 MG tablet  Commonly known as:  ZYRTEC  Take 10 mg by mouth every morning.     esomeprazole 40 MG capsule  Commonly known as:  NEXIUM  Take 1 capsule (40 mg total) by mouth 2 (two) times daily.     fluorometholone 0.1 % ophthalmic suspension  Commonly known as:  FML  Place 1 drop into the left eye 2 (two) times daily.     FLUoxetine 40 MG capsule  Commonly known as:  PROZAC  Take 1 capsule (40 mg total) by mouth daily.     gabapentin 400 MG capsule  Commonly known as:  NEURONTIN  TAKE 2 CAPSULE BY MOUTH THREE TIMES A DAY     glucose blood test strip  Commonly known as:  ACCU-CHEK SMARTVIEW  Check blood sugar  one time a day     hydrochlorothiazide 25 MG tablet  Commonly known as:  HYDRODIURIL  Take 1 tablet (25 mg total) by mouth every morning.     lactose free nutrition Liqd  Take 237 mLs by mouth 3 (three) times daily with meals.     ENSURE ACTIVE HIGH PROTEIN Liqd  Take 1 Bottle by mouth 4 (four) times daily -  with meals and at bedtime.     lisinopril 40 MG tablet  Commonly known as:  PRINIVIL,ZESTRIL  Take 1 tablet (40 mg total) by mouth every morning.     magnesium (amino acid chelate) 133 MG tablet  Take 1 tablet by mouth 2 (two) times daily.     metFORMIN 500 MG tablet  Commonly known as:  GLUCOPHAGE  TAKE 1 TABLET BY MOUTH EVERY DAY WITH BREAKFAST     oxyCODONE-acetaminophen 10-325 MG per tablet  Commonly known as:  PERCOCET  Take 1 tablet by mouth every 6 (six) hours as needed for pain.     potassium chloride 20 MEQ/15ML (10%) Soln  TAKE 15ML BY MOUTH EVERY DAY     prednisoLONE acetate 1 % ophthalmic suspension  Commonly known as:  PRED FORTE  Place 1 drop into the right eye daily.     traZODone 100 MG tablet  Commonly known as:  DESYREL  Take 100 mg by mouth at bedtime.     vitamin C 500 MG tablet  Commonly known as:  ASCORBIC ACID  Take 500 mg by mouth every morning.     Vitamin D (Ergocalciferol) 50000 UNITS Caps capsule   Commonly known as:  DRISDOL  Take 1 capsule (50,000 Units total) by mouth every 7 (seven) days.         REVIEW OF SYSTEMS:   Constitutional: Denies fevers, chills or abnormal weight loss; Reports mild fatigue.  Eyes: Denies blurriness of vision Ears, nose, mouth, throat, and  face: Denies mucositis or sore throat Respiratory: Denies cough, dyspnea or wheezes Cardiovascular: Denies palpitation, chest discomfort or lower extremity swelling Gastrointestinal:  Denies nausea, heartburn or change in bowel habits, hernia is painful and bleeds sometime the overlying skin. Skin: Denies abnormal skin rashes Lymphatics: Denies new lymphadenopathy or easy bruising Neurological:Denies numbness, tingling or new weaknesses Behavioral/Psych: Mood is stable, no new changes  All other systems were reviewed with the patient and are negative.  PHYSICAL EXAMINATION: ECOG PERFORMANCE STATUS: 2-3  Blood pressure 163/84, pulse 85, temperature 98.3 F (36.8 C), temperature source Oral, resp. rate 18, height 5\' 3"  (1.6 m), weight 156 lb 6.4 oz (70.943 kg), SpO2 100 %.  General appearance: alert, appears stated age, no distress and moderately obese  Head: Normocephalic, without obvious abnormality, atraumatic  Neck: no adenopathy, supple, symmetrical, trachea midline and thyroid not enlarged, symmetric, no tenderness/mass/nodules  Lymph nodes: Cervical adenopathy: None appreciated  Heart:regular rate and rhythm, S1, S2 normal and systolic murmur: systolic ejection 2/6, harsh and previously documented without radiation  Lung:chest clear, no wheezing, rales, normal symmetric air entry, Heart exam - S1, S2 normal, no murmur, no gallop, rate regular  Abdomin: soft, distended, normal bowel sounds, nontender, (+) abdominal surgical wound is covered and has a wound vacuum.   EXT:No peripheral edema on right extremity; Left above the knee amputation. She is wearing a prothesis.   Labs:  CBC Latest Ref Rng 12/02/2014  11/30/2014 10/24/2014  WBC 3.9 - 10.3 10e3/uL 7.8 7.4 6.0  Hemoglobin 11.6 - 15.9 g/dL 11.7 11.6(L) 11.9(L)  Hematocrit 34.8 - 46.6 % 35.1 33.8(L) 35.7(L)  Platelets 145 - 400 10e3/uL 259 278 259    CMP Latest Ref Rng 12/02/2014 11/30/2014 10/24/2014  Glucose 70 - 140 mg/dl 123 123(H) 73  BUN 7.0 - 26.0 mg/dL 16.8 10 7   Creatinine 0.6 - 1.1 mg/dL 0.8 0.65 0.70  Sodium 136 - 145 mEq/L 142 138 138  Potassium 3.5 - 5.1 mEq/L 3.9 4.2 2.8(L)  Chloride 96 - 112 mEq/L - 101 96(L)  CO2 22 - 29 mEq/L 24 23 33(H)  Calcium 8.4 - 10.4 mg/dL 9.0 8.3(L) 8.6(L)  Total Protein 6.4 - 8.3 g/dL 6.9 6.7 6.9  Total Bilirubin 0.20 - 1.20 mg/dL 0.37 0.5 0.5  Alkaline Phos 40 - 150 U/L 94 77 75  AST 5 - 34 U/L 64(H) 53(H) 62(H)  ALT 0 - 55 U/L 48 37(H) 34   Results for GERALYNN, CAPRI (MRN [id number]) as of 12/09/2014 07:59  Ref. Range 12/02/2014 08:19  Iron Latest Ref Range: 41-142 ug/dL 70  UIBC Latest Ref Range: 120-384 ug/dL 363  TIBC Latest Ref Range: 236-444 ug/dL 433  %SAT Latest Ref Range: 21-57 % 16 (L)  Ferritin Latest Ref Range: 9-269 ng/ml 40    RADIOGRAPHIC STUDIES: Mm Digital Screening Bilateral  07/31/2013   CLINICAL DATA:  Screening.  EXAM: DIGITAL SCREENING BILATERAL MAMMOGRAM WITH CAD  COMPARISON:  Previous exam(s).  ACR Breast Density Category c: The breast tissue is heterogeneously dense, which may obscure small masses.  FINDINGS: There are no findings suspicious for malignancy. Images were processed with CAD.  IMPRESSION: No mammographic evidence of malignancy. A result letter of this screening mammogram will be mailed directly to the patient.  RECOMMENDATION: Screening mammogram in one year. (Code:SM-B-01Y)  BI-RADS CATEGORY  1: Negative.   Electronically Signed   By: Abelardo Diesel M.D.   On: 07/31/2013 09:01   ASSESSMENT: MISCHELL BRANFORD 64 y.o. female with a history of Iron deficiency  anemia due to chronic blood loss   PLAN:  1. Iron deficiency anemia secondary to GI lost.  - her  hemoglobin today is 11.7 , and ferritin was 40 last week. She has moderate fatigue.  -I'll set up IV Feraheme 510 mg infusion twice in the next few weeks. -She received IV Feraheme every 2-3 months last year. -I'll see her back in 3 months with iron study 1 week before.  2.abdominal wound infection -much improved now, off the wound vacuum  -she will continue dressing change   3. Elevated transiminases, likely secondary to her chronic hepatitis C  -She was tested positive for hepatitis C in 2009. She has not been treated for hepatitis C,  -She is going to see GI on August 4  4. DM2. --Management per PCP's office.   5. Left BKA -She has a prosthesis, she is wheelchair bound most of time, I encouraged her to continue physical therapy,   6. Smoking cessation -She is still actively smoking, I highly encourage her to quit smoking.  Plan  -IV Feraheme in the next 2 weeks -Return to clinic in 3 months with CBC and iron study one week before.  All questions were answered. The patient knows to call the clinic with any problems, questions or concerns. We can certainly see the patient much sooner if necessary.  I spent 20 minutes counseling the patient face to face. The total time spent in the appointment was 25 minutes.  Truitt Merle 12/09/2014

## 2014-12-09 NOTE — Telephone Encounter (Signed)
Per pof to sch pt appt-gave pt copy of avs-MW sch pt fera

## 2014-12-09 NOTE — Telephone Encounter (Signed)
Per staff message and POF I have scheduled appts. Advised scheduler of appts. JMW  

## 2014-12-09 NOTE — Telephone Encounter (Deleted)
Patient Present / Day Defered 12/09/2014  Additional Instructions feraheme infusion X2 in the next few weeks

## 2014-12-15 DIAGNOSIS — T8131XD Disruption of external operation (surgical) wound, not elsewhere classified, subsequent encounter: Secondary | ICD-10-CM | POA: Diagnosis not present

## 2014-12-15 DIAGNOSIS — E119 Type 2 diabetes mellitus without complications: Secondary | ICD-10-CM | POA: Diagnosis not present

## 2014-12-15 DIAGNOSIS — K7469 Other cirrhosis of liver: Secondary | ICD-10-CM | POA: Diagnosis not present

## 2014-12-16 ENCOUNTER — Other Ambulatory Visit: Payer: Self-pay | Admitting: Hematology

## 2014-12-16 ENCOUNTER — Ambulatory Visit (HOSPITAL_BASED_OUTPATIENT_CLINIC_OR_DEPARTMENT_OTHER): Payer: Medicare Other

## 2014-12-16 VITALS — BP 157/82 | HR 65 | Temp 98.0°F | Resp 18

## 2014-12-16 DIAGNOSIS — H26492 Other secondary cataract, left eye: Secondary | ICD-10-CM | POA: Diagnosis not present

## 2014-12-16 DIAGNOSIS — D500 Iron deficiency anemia secondary to blood loss (chronic): Secondary | ICD-10-CM

## 2014-12-16 DIAGNOSIS — Z961 Presence of intraocular lens: Secondary | ICD-10-CM | POA: Diagnosis not present

## 2014-12-16 DIAGNOSIS — E119 Type 2 diabetes mellitus without complications: Secondary | ICD-10-CM | POA: Diagnosis not present

## 2014-12-16 LAB — HM DIABETES EYE EXAM

## 2014-12-16 MED ORDER — SODIUM CHLORIDE 0.9 % IV SOLN
INTRAVENOUS | Status: DC
  Administered 2014-12-16: 15:00:00 via INTRAVENOUS

## 2014-12-16 MED ORDER — FAMOTIDINE 20 MG PO TABS
20.0000 mg | ORAL_TABLET | Freq: Once | ORAL | Status: AC
  Administered 2014-12-16: 20 mg via ORAL

## 2014-12-16 MED ORDER — FAMOTIDINE 20 MG PO TABS
ORAL_TABLET | ORAL | Status: AC
  Filled 2014-12-16: qty 1

## 2014-12-16 MED ORDER — DIPHENHYDRAMINE HCL 50 MG/ML IJ SOLN
INTRAMUSCULAR | Status: AC
  Filled 2014-12-16: qty 1

## 2014-12-16 MED ORDER — FAMOTIDINE IN NACL 20-0.9 MG/50ML-% IV SOLN
INTRAVENOUS | Status: AC
  Filled 2014-12-16: qty 50

## 2014-12-16 MED ORDER — SODIUM CHLORIDE 0.9 % IV SOLN
510.0000 mg | Freq: Once | INTRAVENOUS | Status: AC
  Administered 2014-12-16: 510 mg via INTRAVENOUS
  Filled 2014-12-16: qty 17

## 2014-12-16 MED ORDER — SODIUM CHLORIDE 0.9 % IJ SOLN
3.0000 mL | Freq: Once | INTRAMUSCULAR | Status: DC | PRN
  Filled 2014-12-16: qty 10

## 2014-12-16 MED ORDER — DIPHENHYDRAMINE HCL 50 MG/ML IJ SOLN
50.0000 mg | Freq: Once | INTRAMUSCULAR | Status: AC
  Administered 2014-12-16: 50 mg via INTRAVENOUS

## 2014-12-16 NOTE — Patient Instructions (Signed)

## 2014-12-22 DIAGNOSIS — K7469 Other cirrhosis of liver: Secondary | ICD-10-CM | POA: Diagnosis not present

## 2014-12-22 DIAGNOSIS — E119 Type 2 diabetes mellitus without complications: Secondary | ICD-10-CM | POA: Diagnosis not present

## 2014-12-22 DIAGNOSIS — T8131XD Disruption of external operation (surgical) wound, not elsewhere classified, subsequent encounter: Secondary | ICD-10-CM | POA: Diagnosis not present

## 2014-12-23 ENCOUNTER — Ambulatory Visit (HOSPITAL_BASED_OUTPATIENT_CLINIC_OR_DEPARTMENT_OTHER): Payer: Medicare Other

## 2014-12-23 VITALS — BP 186/91 | HR 82 | Temp 97.8°F | Resp 20

## 2014-12-23 DIAGNOSIS — D500 Iron deficiency anemia secondary to blood loss (chronic): Secondary | ICD-10-CM | POA: Diagnosis present

## 2014-12-23 DIAGNOSIS — H26492 Other secondary cataract, left eye: Secondary | ICD-10-CM | POA: Diagnosis not present

## 2014-12-23 MED ORDER — DIPHENHYDRAMINE HCL 25 MG PO CAPS: 50.0000 mg | ORAL_CAPSULE | Freq: Once | ORAL | Status: DC

## 2014-12-23 MED ORDER — FAMOTIDINE IN NACL 20-0.9 MG/50ML-% IV SOLN
20.0000 mg | Freq: Once | INTRAVENOUS | Status: AC
  Administered 2014-12-23: 20 mg via INTRAVENOUS

## 2014-12-23 MED ORDER — DIPHENHYDRAMINE HCL 50 MG/ML IJ SOLN
INTRAMUSCULAR | Status: AC
  Filled 2014-12-23: qty 1

## 2014-12-23 MED ORDER — DIPHENHYDRAMINE HCL 50 MG/ML IJ SOLN
50.0000 mg | Freq: Once | INTRAMUSCULAR | Status: AC
  Administered 2014-12-23: 50 mg via INTRAVENOUS

## 2014-12-23 MED ORDER — SODIUM CHLORIDE 0.9 % IV SOLN
510.0000 mg | Freq: Once | INTRAVENOUS | Status: AC
  Administered 2014-12-23: 510 mg via INTRAVENOUS
  Filled 2014-12-23: qty 17

## 2014-12-23 MED ORDER — FAMOTIDINE IN NACL 20-0.9 MG/50ML-% IV SOLN
INTRAVENOUS | Status: AC
  Filled 2014-12-23: qty 50

## 2014-12-23 MED ORDER — SODIUM CHLORIDE 0.9 % IV SOLN
Freq: Once | INTRAVENOUS | Status: AC
  Administered 2014-12-23: 15:00:00 via INTRAVENOUS

## 2014-12-23 MED ORDER — FAMOTIDINE 20 MG PO TABS: 20.0000 mg | ORAL_TABLET | Freq: Once | ORAL | Status: DC

## 2014-12-23 NOTE — Progress Notes (Signed)
Feraheme pre meds confirmed with Dr. Burr Medico.  Pt is to receive Benadryl 50 mg IV and Pepcid 20 mg IV prior to receiving Feraheme.

## 2014-12-23 NOTE — Patient Instructions (Signed)

## 2014-12-24 ENCOUNTER — Telehealth: Payer: Self-pay | Admitting: Internal Medicine

## 2014-12-24 ENCOUNTER — Other Ambulatory Visit: Payer: Self-pay | Admitting: *Deleted

## 2014-12-24 ENCOUNTER — Encounter: Payer: Self-pay | Admitting: *Deleted

## 2014-12-24 DIAGNOSIS — Z9889 Other specified postprocedural states: Principal | ICD-10-CM

## 2014-12-24 DIAGNOSIS — Z8719 Personal history of other diseases of the digestive system: Secondary | ICD-10-CM

## 2014-12-24 NOTE — Telephone Encounter (Signed)
Spoke to dr Naaman Plummer and pt, pt is reminded that she was given 30 days worth of pain med 7/5 and it cannot be refilled until 8/5 or later also that dr Naaman Plummer states dr blackmon will need to prescribe pain meds from now on, she is agreeable

## 2014-12-24 NOTE — Addendum Note (Signed)
Addended by: Orson Gear on: 12/24/2014 10:07 AM   Modules accepted: Orders

## 2014-12-24 NOTE — Telephone Encounter (Signed)
Patient returning your phone call about her medication.

## 2014-12-24 NOTE — Telephone Encounter (Signed)
Pt called to inform, no answer.

## 2014-12-24 NOTE — Telephone Encounter (Signed)
Last refill 11/30/14 Pt # [phone]

## 2014-12-29 ENCOUNTER — Encounter: Payer: Self-pay | Admitting: Internal Medicine

## 2014-12-29 ENCOUNTER — Ambulatory Visit (INDEPENDENT_AMBULATORY_CARE_PROVIDER_SITE_OTHER): Payer: Medicare Other | Admitting: Internal Medicine

## 2014-12-29 ENCOUNTER — Telehealth (HOSPITAL_COMMUNITY): Payer: Self-pay | Admitting: Radiology

## 2014-12-29 VITALS — BP 172/79 | HR 68 | Temp 98.4°F | Ht 63.0 in | Wt 170.0 lb

## 2014-12-29 DIAGNOSIS — B182 Chronic viral hepatitis C: Secondary | ICD-10-CM | POA: Diagnosis not present

## 2014-12-29 DIAGNOSIS — T8131XD Disruption of external operation (surgical) wound, not elsewhere classified, subsequent encounter: Secondary | ICD-10-CM | POA: Diagnosis not present

## 2014-12-29 DIAGNOSIS — K7469 Other cirrhosis of liver: Secondary | ICD-10-CM | POA: Diagnosis not present

## 2014-12-29 DIAGNOSIS — E119 Type 2 diabetes mellitus without complications: Secondary | ICD-10-CM | POA: Diagnosis not present

## 2014-12-29 MED ORDER — OMEPRAZOLE 20 MG PO CPDR: 20.0000 mg | DELAYED_RELEASE_CAPSULE | Freq: Every day | ORAL | Status: DC

## 2014-12-29 MED ORDER — LEDIPASVIR-SOFOSBUVIR 90-400 MG PO TABS: 1.0000 | ORAL_TABLET | Freq: Every day | ORAL | Status: DC

## 2014-12-29 NOTE — Progress Notes (Signed)
HPI:  +[name] is a 64 y.o. female who presents for initial evaluation and management of chronic hepatitis C.  Patient tested positive in 1990s and had a liver biopsy with F1A1. Hepatitis C risk factors present are: non-IVDU. Patient denies IV drug abuse, multiple sexual partners, renal dialysis, sexual contact with person with liver disease, tattoos. Patient has had other studies performed. Results: hepatitis C RNA by PCR, result: positive. Patient has not had prior treatment for Hepatitis C. Patient does not have a past history of liver disease. Patient does have a family history of liver disease.  Her mother had cirrhosis of the liver and thinks she was positive for hepatitis C.  She was diagnosed years ago but did not realize it was chronic.      Patient does not have documented immunity to Hepatitis A. Patient does not have documented immunity to Hepatitis B.     Review of Systems A comprehensive review of systems was negative except for: Behavioral/Psych: positive for anxious about diagnosis   Past Medical History  Diagnosis Date  . Hypertension   . Colon polyps   . Stomach problems   . Hypokalemic alkalosis   . Allergic rhinitis   . GERD (gastroesophageal reflux disease)   . Stroke 2010    no residual problems  . Anemia, iron deficiency 05/03/2011    recieves iron infusions  . Anemia   . GI bleeding   . Diverticulitis   . Transfusion history   . Difficulty sleeping     takes trazadone for sleep  . Peripheral vascular disease   . Hepatitis     hep C  . Anxiety   . Shortness of breath     due to anemia  . Diabetes mellitus without complication     Prior to Admission medications   Medication Sig Start Date End Date Taking? Authorizing Provider  ACCU-CHEK FASTCLIX LANCETS MISC Check your blood sugar one time a day 04/08/14   Jerrye Noble, MD  ALREX 0.2 % SUSP  12/22/13   Historical Provider, MD  aspirin EC 81 MG tablet Take 81 mg by mouth daily.     Historical  Provider, MD  atenolol (TENORMIN) 50 MG tablet TAKE 1 TABLET BY MOUTH EVERY MORNING 11/04/14   Jerrye Noble, MD  cetirizine (ZYRTEC) 10 MG tablet Take 10 mg by mouth every morning.     Historical Provider, MD  esomeprazole (NEXIUM) 40 MG capsule Take 1 capsule (40 mg total) by mouth 2 (two) times daily. 10/22/14   Jerrye Noble, MD  fluorometholone (FML) 0.1 % ophthalmic suspension Place 1 drop into the left eye 2 (two) times daily.     Historical Provider, MD  FLUoxetine (PROZAC) 40 MG capsule Take 1 capsule (40 mg total) by mouth daily. 10/08/14   Jerrye Noble, MD  gabapentin (NEURONTIN) 400 MG capsule TAKE 2 CAPSULE BY MOUTH THREE TIMES A DAY 10/22/14   Jerrye Noble, MD  glucose blood (ACCU-CHEK SMARTVIEW) test strip Check blood sugar  one time a day 09/27/14   Jerrye Noble, MD  hydrochlorothiazide (HYDRODIURIL) 25 MG tablet Take 1 tablet (25 mg total) by mouth every morning. 10/22/14   Jerrye Noble, MD  lactose free nutrition (BOOST PLUS) LIQD Take 237 mLs by mouth 3 (three) times daily with meals. Patient not taking: Reported on 12/09/2014 10/16/13   Jerrye Noble, MD  lisinopril (PRINIVIL,ZESTRIL) 40 MG tablet Take 1 tablet (40 mg total) by mouth every morning.  10/22/14   Jerrye Noble, MD  metFORMIN (GLUCOPHAGE) 500 MG tablet TAKE 1 TABLET BY MOUTH EVERY DAY WITH BREAKFAST 10/22/14   Jerrye Noble, MD  Nutritional Supplements (ENSURE ACTIVE HIGH PROTEIN) LIQD Take 1 Bottle by mouth 4 (four) times daily -  with meals and at bedtime. Patient not taking: Reported on 12/09/2014 10/22/14   Jerrye Noble, MD  oxyCODONE-acetaminophen (PERCOCET) 10-325 MG per tablet Take 1 tablet by mouth every 6 (six) hours as needed for pain. 11/30/14   [name], MD  oxyCODONE-acetaminophen (PERCOCET) 7.5-325 MG per tablet Take 1 tablet by mouth as needed. 09/21/14   Historical Provider, MD  potassium chloride 20 MEQ/15ML (10%) SOLN TAKE 15ML BY MOUTH EVERY DAY Patient taking differently: 30 mEq. TAKE 15ML BY MOUTH EVERY DAY  10/22/14   Jerrye Noble, MD  prednisoLONE acetate (PRED FORTE) 1 % ophthalmic suspension Place 1 drop into the right eye daily.     Historical Provider, MD  Specialty Vitamins Products (MAGNESIUM, AMINO ACID CHELATE,) 133 MG tablet Take 1 tablet by mouth 2 (two) times daily.    Historical Provider, MD  traMADol (ULTRAM) 50 MG tablet TK 1 T PO  Q 6 H PRN 11/02/14   Historical Provider, MD  traZODone (DESYREL) 100 MG tablet Take 100 mg by mouth at bedtime.    Historical Provider, MD  vitamin C (ASCORBIC ACID) 500 MG tablet Take 500 mg by mouth every morning.    Historical Provider, MD  Vitamin D, Ergocalciferol, (DRISDOL) 50000 UNITS CAPS capsule Take 1 capsule (50,000 Units total) by mouth every 7 (seven) days. 12/02/14   [name], MD    Allergies  Allergen Reactions  . Ciprofloxacin Hives and Swelling  . Morphine And Related Other (See Comments)    Makes pt sad and paranoid  . Penicillins Hives and Swelling  . Codeine Hives and Swelling  . Feraheme [Ferumoxytol] Itching    Tolerated Feraheme 6/26 & 7/21 pre-medications prior to medication    History  Substance Use Topics  . Smoking status: Current Every Day Smoker -- 0.20 packs/day for 17 years    Types: Cigarettes  . Smokeless tobacco: Never Used     Comment: wants to quit.  Smoking less 1 pk per week.  1-2 cigs per day  . Alcohol Use: 0.0 oz/week    0 Standard drinks or equivalent per week     Comment: Occasionally    Family History  Problem Relation Age of Onset  . Cancer Father     unknown  . Hypertension Mother   . Diverticulitis Mother    Mother with cirrhosis   Objective:   Filed Vitals:   12/29/14 1352  BP: 172/79  Pulse: 68  Temp: 98.4 F (36.9 C)   Gen: in no apparent distress and alert HEENT: anicteric Cor: RRR and No murmurs clear Abd: Bowel sounds are normal, liver is not enlarged, spleen is not enlarged Ext: peripheral pulses normal, no pedal edema, no clubbing or cyanosis negative for - jaundice,  spider hemangioma, telangiectasia, palmar erythema, ecchymosis and atrophy Musculoskeletal:left leg prosthesis, no joint swelling; in wheelchair Neuro: non focal  Laboratory Genotype:  Lab Results  Component Value Date   HCVGENOTYPE 1b 11/30/2014   HCV viral load:  Lab Results  Component Value Date   HCVQUANT 3016010* 11/30/2014   Lab Results  Component Value Date   WBC 7.8 12/02/2014   HGB 11.7 12/02/2014   HCT 35.1 12/02/2014   MCV 91.9 12/02/2014  PLT 259 12/02/2014    Lab Results  Component Value Date   CREATININE 0.8 12/02/2014   BUN 16.8 12/02/2014   NA 142 12/02/2014   K 3.9 12/02/2014   CL 101 11/30/2014   CO2 24 12/02/2014    Lab Results  Component Value Date   ALT 48 12/02/2014   AST 64* 12/02/2014   ALKPHOS 94 12/02/2014   BILITOT 0.37 12/02/2014   INR 1.09 11/30/2014      Assessment: Chronic Hepatitis C genotype 1b  Plan: 1) Patient counseled extensively on limiting acetaminophen to no more than 2 grams daily, avoidance of alcohol. 2) Transmission discussed with patient including sexual transmission, sharing razors and toothbrush.   3) Will need referral to gastroenterology if concern for cirrhosis 4) Will need referral for substance abuse counseling: No. 5) Will prescribe Harvoni now, elastography tomorrow 6) Hepatitis A vaccine Yes.   7) Hepatitis B vaccine Yes.   8) Pneumovax vaccine if concern for cirrhosis 9) will follow up after starting medication  10) drug interactions - patient feels it would be difficult to stop ppi so will prescribe 20 mg omeprazole to replace Nexium.  Will discuss with PCP.

## 2014-12-29 NOTE — Patient Instructions (Signed)
Date 12/29/2014  Dear [name], As discussed in the [facility], your hepatitis C therapy will include the following medications:          Harvoni 90mg /400mg  tablet:           Take 1 tablet by mouth once daily   Please note that ALL MEDICATIONS WILL START ON THE SAME DATE for a total of 12 weeks. ---------------------------------------------------------------- Your HCV Treatment Start Date: TBA   Your HCV genotype:  1b    Liver Fibrosis: TBD    ---------------------------------------------------------------- YOUR PHARMACY CONTACT:   [facility] [place] Phone: [phone] Hours: Monday to Friday 7:30 am to 6:00 pm   Please always contact your pharmacy at least 3-4 business days before you run out of medications to ensure your next month's medication is ready or 1 week prior to running out if you receive it by mail.  Remember, each prescription is for 28 days. ---------------------------------------------------------------- GENERAL NOTES REGARDING YOUR HEPATITIS C MEDICATION:  SOFOSBUVIR/LEDIPASVIR (HARVONI): - Harvoni tablet is taken daily with OR without food. - The tablets are orange. - The tablets should be stored at room temperature.  - Acid reducing agents such as H2 blockers (ie. Pepcid (famotidine), Zantac (ranitidine), Tagamet (cimetidine), Axid (nizatidine) and proton pump inhibitors (ie. Prilosec (omeprazole), Protonix (pantoprazole), Nexium (esomeprazole), or Aciphex (rabeprazole)) can decrease effectiveness of Harvoni. Do not take until you have discussed with a health care provider.    -Antacids that contain magnesium and/or aluminum hydroxide (ie. Milk of [place]ensia, Rolaids, Gaviscon, Maalox, Mylanta, an dArthritis Pain Formula)can reduce absorption of Harvoni, so take them at least 4 hours before or after Harvoni.  -Calcium carbonate (calcium supplements or antacids such as Tums, Caltrate,  Os-Cal)needs to be taken at least 4 hours hours before or after Harvoni.  -St. John's wort or any products that contain St. John's wort like some herbal supplements  Please inform the office prior to starting any of these medications.  - The common side effects with Harvoni:      1. Fatigue      2. Headache      3. Nausea      4. Diarrhea      5. Insomnia   Support Path is a suite of resources designed to help patients start with HARVONI and move toward treatment completion [place] helps patients access therapy and get off to an efficient start  Benefits investigation and prior authorization support Co-pay and other financial assistance A specialty pharmacy finder CO-PAY COUPON The [place] co-pay coupon may help eligible patients lower their out-of-pocket costs. With a co-pay coupon, most eligible patients may pay no more than $5 per co-pay (restrictions apply) www.harvoni.com call 218-227-3171 Not valid for patients enrolled in government healthcare prescription drug programs, such as Medicare Part D and Medicaid. Patients in the coverage gap known as the "donut hole" also are not eligible The HARVONI co-pay coupon program will cover the out-of-pocket costs for HARVONI prescriptions up to a maximum of 25% of the catalog price of a 12-week regimen of HARVONI  Please note that this only lists the most common side effects and is NOT a comprehensive list of the potential side effects of these medications. For more information, please review the drug information sheets that come with your medication package from the pharmacy.  ---------------------------------------------------------------- GENERAL HELPFUL HINTS ON HCV THERAPY: 1. No alcohol. 2. Protect against sun-sensitivity/sunburns (wear sunglasses, hat, long sleeves, pants  and sunscreen). 3. Stay well-hydrated/well-moisturized. 4. Notify the [facility] of any changes in your other over-the-counter/herbal or  prescription medications. 5. If you miss a dose of your medication, take the missed dose as soon as you remember. Return to your regular time/dose schedule the next day.  6.  Do not stop taking your medications without first talking with your healthcare provider. 7.  You may take Tylenol (acetaminophen), as long as the dose is less than 2000 mg (OR no more than 4 tablets of the Tylenol Extra Strengths '500mg'$  tablet) in 24 hours. 8.  You will need to obtain routine labs and/or office visits at [facility] at weeks 4 and 12 as well as 12 and 24 weeks after completion of treatment.   Scharlene Gloss, [place] for [place] [address] [place], [place]  [id number] [phone]

## 2014-12-29 NOTE — Telephone Encounter (Signed)
Called pt to remind [facility] of [facility]14 at 11am.. Pt agreed to stay npo and arrive at 1045 for registration

## 2014-12-30 ENCOUNTER — Ambulatory Visit (HOSPITAL_COMMUNITY)
Admission: RE | Admit: 2014-12-30 | Discharge: 2014-12-30 | Disposition: A | Discharge status: Home / Self Care | Payer: Medicare Other | Source: Ambulatory Visit | Attending: Internal Medicine | Admitting: Internal Medicine

## 2014-12-30 DIAGNOSIS — I10 Essential (primary) hypertension: Secondary | ICD-10-CM | POA: Insufficient documentation

## 2014-12-30 DIAGNOSIS — B182 Chronic viral hepatitis C: Secondary | ICD-10-CM | POA: Diagnosis not present

## 2014-12-30 DIAGNOSIS — E119 Type 2 diabetes mellitus without complications: Secondary | ICD-10-CM | POA: Insufficient documentation

## 2014-12-31 ENCOUNTER — Other Ambulatory Visit: Payer: Self-pay | Admitting: Internal Medicine

## 2014-12-31 DIAGNOSIS — T8189XD Other complications of procedures, not elsewhere classified, subsequent encounter: Secondary | ICD-10-CM | POA: Diagnosis not present

## 2015-01-05 ENCOUNTER — Encounter: Payer: Medicare Other | Admitting: Internal Medicine

## 2015-01-06 ENCOUNTER — Encounter (HOSPITAL_COMMUNITY): Payer: Self-pay | Admitting: Pharmacy Technician

## 2015-01-27 ENCOUNTER — Other Ambulatory Visit: Payer: Self-pay | Admitting: Internal Medicine

## 2015-01-28 ENCOUNTER — Telehealth: Payer: Self-pay | Admitting: Lab

## 2015-01-28 ENCOUNTER — Ambulatory Visit (INDEPENDENT_AMBULATORY_CARE_PROVIDER_SITE_OTHER): Payer: Medicare Other | Admitting: Internal Medicine

## 2015-01-28 VITALS — BP 156/97 | HR 77 | Temp 98.3°F | Ht 63.0 in | Wt 167.7 lb

## 2015-01-28 DIAGNOSIS — Z9889 Other specified postprocedural states: Secondary | ICD-10-CM

## 2015-01-28 DIAGNOSIS — Z0000 Encounter for general adult medical examination without abnormal findings: Secondary | ICD-10-CM

## 2015-01-28 DIAGNOSIS — Z8719 Personal history of other diseases of the digestive system: Secondary | ICD-10-CM

## 2015-01-28 DIAGNOSIS — B182 Chronic viral hepatitis C: Secondary | ICD-10-CM | POA: Diagnosis not present

## 2015-01-28 DIAGNOSIS — I10 Essential (primary) hypertension: Secondary | ICD-10-CM | POA: Diagnosis not present

## 2015-01-28 DIAGNOSIS — Z23 Encounter for immunization: Secondary | ICD-10-CM | POA: Diagnosis not present

## 2015-01-28 MED ORDER — AMLODIPINE BESYLATE 10 MG PO TABS: 10.0000 mg | ORAL_TABLET | Freq: Every day | ORAL | Status: DC

## 2015-01-28 MED ORDER — OXYCODONE-ACETAMINOPHEN 10-325 MG PO TABS: 1.0000 | ORAL_TABLET | Freq: Four times a day (QID) | ORAL | Status: DC | PRN

## 2015-01-28 NOTE — Telephone Encounter (Signed)
Called pt to schedule appmts with Dr Linus Salmons 6 wks after 01/04/15-when she picked up meds and and 2 wks before Dr's appmt for labs -no answer at home # and cell phone doesn't receive any incoming calls

## 2015-01-28 NOTE — Progress Notes (Signed)
Patient ID: [name], female   DOB: 1950-12-17, 64 y.o.   MRN: [id number]    Subjective:   Patient ID: [name] female   DOB: March 08, 1951 64 y.o.   MRN: [id number]  HPI: [name] is a 64 y.o.  pleasant woman with past medical history of hypertension, non-insulin dependent Type 2 DM, hypokalemia, iron-deficiency anemia requiring frequent IV iron infusions, chronic HCV infection undergoing treatment, depression, CVA, and GERD who presents for follow-up of abdominal pain.   She reports compliance with taking atenolol, HCTZ, and lisinopril for hypertension. She has chronic right LE edema but denies headache, chest pain, or lightheadedness. She is also compliant with taking potassium chloride 30 mEq daily and OTC magnesium supplements. She denies muscle cramping.   She has chronic hepatitis C with last VL of [id number] on 07/07/14. She started treatment with Harvoni one month ago with no complications. She had liver biopsy in 2007 with clinically grade 1 stage 1 disease with very early periportal fibrosis. Her US abdomen 12/30/14 revealed fibrosis score of some F3 and F4 with high risk of fibrosis. She is not immune to Heptatitis A & B. Her pruritic blistering-type rash predominately on her upper extremities that is worse with sun exposure has improved since being on Harvoni. Her serum porphyrins were elevated at last visit. She receives frequent IV iron infusions for chronic iron-deficiency anemia due to chronic GI loss thought to be due to AVM's and diverticulosis. She had elective left hemicolectomy on 02/16/12 with abdominal hernia repair in January of this year that was complicated by wound dehiscence requiring prolonged wound vac placement. She saw Dr. Ninfa Linden on 12/30/14 with well-healing wound that was almost closed and instructed to do dressings only as needed. She was given refill on her percocet 10-325 mg for 60 tablets and was instructed to follow-up with her PCP for refill. She is to  see her surgeon again next week.  She denies nausea, vomiting, or change in BM.      Past Medical History  Diagnosis Date  . Hypertension   . Colon polyps   . Stomach problems   . Hypokalemic alkalosis   . Allergic rhinitis   . GERD (gastroesophageal reflux disease)   . Stroke 2010    no residual problems  . Anemia, iron deficiency 05/03/2011    recieves iron infusions  . Anemia   . GI bleeding   . Diverticulitis   . Transfusion history   . Difficulty sleeping     takes trazadone for sleep  . Peripheral vascular disease   . Hepatitis     hep C  . Anxiety   . Shortness of breath     due to anemia  . Diabetes mellitus without complication    Current Outpatient Prescriptions  Medication Sig Dispense Refill  . ACCU-CHEK FASTCLIX LANCETS MISC Check your blood sugar one time a day 102 each 5  . ALREX 0.2 % SUSP     . aspirin EC 81 MG tablet Take 81 mg by mouth daily.     Marland Kitchen atenolol (TENORMIN) 50 MG tablet TAKE 1 TABLET BY MOUTH EVERY MORNING 30 tablet 9  . cetirizine (ZYRTEC) 10 MG tablet Take 10 mg by mouth every morning.     . fluorometholone (FML) 0.1 % ophthalmic suspension Place 1 drop into the left eye 2 (two) times daily.     Marland Kitchen FLUoxetine (PROZAC) 40 MG capsule Take 1 capsule (40 mg total) by mouth daily. 30 capsule 4  .  gabapentin (NEURONTIN) 400 MG capsule TAKE 2 CAPSULE BY MOUTH THREE TIMES A DAY 180 capsule 1  . glucose blood (ACCU-CHEK SMARTVIEW) test strip Check blood sugar  one time a day 50 each 12  . hydrochlorothiazide (HYDRODIURIL) 25 MG tablet Take 1 tablet (25 mg total) by mouth every morning. 90 tablet 3  . lactose free nutrition (BOOST PLUS) LIQD Take 237 mLs by mouth 3 (three) times daily with meals. (Patient not taking: Reported on 12/29/2014) 237 mL 0  . Ledipasvir-Sofosbuvir (HARVONI) 90-400 MG TABS Take 1 tablet by mouth daily. 28 tablet 2  . lisinopril (PRINIVIL,ZESTRIL) 40 MG tablet Take 1 tablet (40 mg total) by mouth every morning. 90 tablet 4  .  metFORMIN (GLUCOPHAGE) 500 MG tablet TAKE 1 TABLET BY MOUTH EVERY DAY WITH BREAKFAST 90 tablet 5  . Nutritional Supplements (ENSURE ACTIVE HIGH PROTEIN) LIQD Take 1 Bottle by mouth 4 (four) times daily -  with meals and at bedtime. 237 mL 12  . omeprazole (PRILOSEC) 20 MG capsule Take 1 capsule (20 mg total) by mouth daily. 30 capsule 11  . oxyCODONE-acetaminophen (PERCOCET) 10-325 MG per tablet Take 1 tablet by mouth every 6 (six) hours as needed for pain. (Patient not taking: Reported on 12/29/2014) 120 tablet 0  . potassium chloride 20 MEQ/15ML (10%) SOLN TAKE 15ML BY MOUTH EVERY DAY (Patient taking differently: 30 mEq. TAKE 15ML BY MOUTH EVERY DAY) 450 mL 1  . prednisoLONE acetate (PRED FORTE) 1 % ophthalmic suspension Place 1 drop into the right eye daily.     Marland Kitchen Specialty Vitamins Products (MAGNESIUM, AMINO ACID CHELATE,) 133 MG tablet Take 1 tablet by mouth 2 (two) times daily.    . traMADol (ULTRAM) 50 MG tablet TK 1 T PO  Q 6 H PRN  0  . traZODone (DESYREL) 100 MG tablet Take 100 mg by mouth at bedtime.    . vitamin C (ASCORBIC ACID) 500 MG tablet Take 500 mg by mouth every morning.    . Vitamin D, Ergocalciferol, (DRISDOL) 50000 UNITS CAPS capsule Take 1 capsule (50,000 Units total) by mouth every 7 (seven) days. 8 capsule 0   No current facility-administered medications for this visit.   Family History  Problem Relation Age of Onset  . Cancer Father     unknown  . Hypertension Mother   . Diverticulitis Mother    Social History   Social History  . Marital Status: Widowed    Spouse Name: N/A  . Number of Children: N/A  . Years of Education: N/A   Social History Main Topics  . Smoking status: Current Every Day Smoker -- 0.20 packs/day for 17 years    Types: Cigarettes  . Smokeless tobacco: Never Used     Comment: wants to quit.  Smoking less 1 pk per week.  1-2 cigs per day  . Alcohol Use: 0.0 oz/week    0 Standard drinks or equivalent per week     Comment: Occasionally  .  Drug Use: No     Comment: Thanksgiving and Christmas  Holidays 2014 hasn't had any since  . Sexual Activity: Not on file   Other Topics Concern  . Not on file   Social History Narrative   Review of Systems: Review of Systems  Constitutional: Negative for fever and chills.  Eyes: Positive for blurred vision (chronic).  Respiratory: Negative for cough, shortness of breath and wheezing.   Cardiovascular: Positive for leg swelling.  Gastrointestinal: Positive for abdominal pain (chronic since hernia repair).  Negative for nausea, vomiting, diarrhea, constipation and blood in stool.  Genitourinary: Negative for dysuria, urgency, frequency and hematuria.  Skin: Positive for itching and rash (b/l UE, back, and chest ).  Neurological: Positive for sensory change (chronic peripheral neuropathy). Negative for dizziness and headaches.     Objective:  Physical Exam: Filed Vitals:   01/28/15 1428  BP: 156/97  Pulse: 77  Temp: 98.3 F (36.8 C)  TempSrc: Oral  Height: 5\' 3"  (1.6 m)  Weight: 167 lb 11.2 oz (76.068 kg)  SpO2: 100%    Physical Exam  Constitutional: She is oriented to person, place, and time. She appears well-developed and well-nourished. No distress.  HENT:  Head: Normocephalic and atraumatic.  Right Ear: External ear normal.  Left Ear: External ear normal.  Nose: Nose normal.  Mouth/Throat: Oropharynx is clear and moist. No oropharyngeal exudate.  Eyes: Conjunctivae and EOM are normal. Pupils are equal, round, and reactive to light. Right eye exhibits no discharge. Left eye exhibits no discharge. No scleral icterus.  Neck: Normal range of motion. Neck supple.  Cardiovascular: Normal rate, regular rhythm and normal heart sounds.   No murmur heard. Pulmonary/Chest: Effort normal and breath sounds normal. No respiratory distress. She has no wheezes. She has no rales.  Abdominal: Soft. Bowel sounds are normal. She exhibits no distension. There is tenderness (mild at  incisional site L>R ). There is no rebound and no guarding.  Well-healed ventral abdominal hernia   Musculoskeletal: She exhibits edema (trace right LE). She exhibits no tenderness.  S/p left BKA with prosthesis on   Neurological: She is alert and oriented to person, place, and time.  Skin: Skin is warm and dry. Rash (PCT rash on b/l UE and chest) noted. She is not diaphoretic. No erythema. No pallor.  Psychiatric: She has a normal mood and affect. Her behavior is normal. Judgment and thought content normal.    Assessment & Plan:   Please see problem list for problem-based assessment and plan

## 2015-01-28 NOTE — Patient Instructions (Signed)
-  Start taking norvasc 10 mg daily for high blood pressure  -Will refer you to GI for endoscopy, they will call you with the appt -Will give hepatitis A and B shots today, you will need more in 1 and 6 months  -Please come back for flu shot -Please come back in November and see Dr. Ninfa Linden -Very nice seeing you again  General Instructions:   Please bring your medicines with you each time you come to clinic.  Medicines may include prescription medications, over-the-counter medications, herbal remedies, eye drops, vitamins, or other pills.   Progress Toward Treatment Goals:  Treatment Goal 05/14/2014  Blood pressure -  Stop smoking -  Prevent falls at goal    Self Care Goals & Plans:  Self Care Goal 01/28/2015  Manage my medications take my medicines as prescribed; bring my medications to every visit; refill my medications on time  Monitor my health keep track of my blood glucose; bring my glucose meter and log to each visit  Eat healthy foods eat more vegetables; eat foods that are low in salt; eat baked foods instead of fried foods  Be physically active find an activity I enjoy  Stop smoking -  Prevent falls -    No flowsheet data found.   Care Management & Community Referrals:  Referral 05/14/2014  Referrals made for care management support [facility] case management program

## 2015-01-29 ENCOUNTER — Encounter: Payer: Self-pay | Admitting: Internal Medicine

## 2015-01-29 NOTE — Assessment & Plan Note (Addendum)
-  Pt received 1st of series of hepatitis A and B vaccinations today, next due at 1 month (October 2016) and 6 months (March 2016) -Pt to return for annual influenza vaccination  -Inquire regarding tdap and zoster vaccinations at next visit -Inquire regarding pap smear at next visit

## 2015-01-29 NOTE — Assessment & Plan Note (Addendum)
Assessment: Pt with chronic blistering type rash predominately on bilateral UE that is worse with sun exposure most likely due to porphyria cutanea tarda with elevated serum porphyrins in setting of untreated chronic HCV infection and frequent iron infusions who presents with improved rash on anti-viral hepatitis C treatment.     Plan:  -Consider urine fractionated porphyrins at next visit, declined today  -Pt instructed to avoid sun exposure -Continue harvoni for 22-month duration for treatment of HCV, if does not improve after completion of treatment, consider referring to dermatology for further management   -Will discuss with her hematologist-oncologist regarding her frequent blood transfusions

## 2015-01-29 NOTE — Assessment & Plan Note (Signed)
Assessment: Pt with moderately well-controlled hypertension compliant with three-class (BB, ACEi, & diuretic) anti-hypertensive therapy who presents with blood pressure of 156/97.   Plan:  -BP 156/97 not at goal <140/90 -Prescribe amlodipine 10 mg daily for ACD therapy  -Continue atenolol 50 mg daily, HCTZ 25 mg daily, and lisinopril 40 mg daily -Last CMP on 11/30/14 was normal

## 2015-01-29 NOTE — Assessment & Plan Note (Signed)
Assessment: Pt with left hemicolectomy on 02/16/12 due to chronic blood loss from AVM's s/p ventral hernia repair January 8341 complicated by wound dehiscence February 2016 who presents with umbilical pain controlled with narcotic therapy.   Plan: -Pt to see surgeon Dr. Ninfa Linden for follow-up -Refill percocet 10-325 mg Q 6 hr PRN pain #120, pt to limit tylenol to 2 g daily in setting of liver disease

## 2015-01-29 NOTE — Assessment & Plan Note (Signed)
Assessment: Pt with chronic hepatitis C diagnosed in 1997 with liver biopsy in 2007 with clinically grade 1 stage 1 disease with very early periportal fibrosis andlast US abdomen on 12/30/14 with fibrosis score of some F3 and F4 with high risk of fibrosis who is currently undergoing anti-viral therapy with no complications.   Plan:  -Continue Harvoni for 70-month duration to complete early October 2016 -Pt received 1st in series of hepatitis A & B vaccinations today on 01/28/15 -Pt has had pneumococcal vaccination in 2012 -Refer to GI for EGD to assess for esophageal varices  -Pt follows with ID Dr. Linus Salmons

## 2015-02-01 NOTE — Progress Notes (Signed)
[facility] Clinic Attending  Case discussed with Dr. Rabbani soon after the resident saw the patient.  We reviewed the resident's history and exam and pertinent patient test results.  I agree with the assessment, diagnosis, and plan of care documented in the resident's note.  

## 2015-02-04 DIAGNOSIS — T8189XD Other complications of procedures, not elsewhere classified, subsequent encounter: Secondary | ICD-10-CM | POA: Diagnosis not present

## 2015-02-17 ENCOUNTER — Other Ambulatory Visit: Payer: Self-pay | Admitting: Internal Medicine

## 2015-02-21 ENCOUNTER — Other Ambulatory Visit: Payer: Self-pay | Admitting: Internal Medicine

## 2015-02-21 DIAGNOSIS — Z9889 Other specified postprocedural states: Principal | ICD-10-CM

## 2015-02-21 DIAGNOSIS — Z8719 Personal history of other diseases of the digestive system: Secondary | ICD-10-CM

## 2015-02-21 NOTE — Telephone Encounter (Signed)
Pt called requesting percocet  to be filled. °

## 2015-02-21 NOTE — Telephone Encounter (Signed)
This was only short-term and it is too early for refill.   Dr. Naaman Plummer

## 2015-02-22 ENCOUNTER — Ambulatory Visit (HOSPITAL_COMMUNITY)
Admission: RE | Admit: 2015-02-22 | Discharge: 2015-02-22 | Disposition: A | Discharge status: Home / Self Care | Payer: Medicare Other | Source: Ambulatory Visit | Attending: Internal Medicine | Admitting: Internal Medicine

## 2015-02-22 ENCOUNTER — Ambulatory Visit (INDEPENDENT_AMBULATORY_CARE_PROVIDER_SITE_OTHER): Payer: Medicare Other | Admitting: Internal Medicine

## 2015-02-22 ENCOUNTER — Encounter: Payer: Self-pay | Admitting: Internal Medicine

## 2015-02-22 VITALS — BP 154/75 | HR 78 | Temp 97.9°F

## 2015-02-22 DIAGNOSIS — W010XXA Fall on same level from slipping, tripping and stumbling without subsequent striking against object, initial encounter: Secondary | ICD-10-CM | POA: Insufficient documentation

## 2015-02-22 DIAGNOSIS — M810 Age-related osteoporosis without current pathological fracture: Secondary | ICD-10-CM

## 2015-02-22 DIAGNOSIS — M1990 Unspecified osteoarthritis, unspecified site: Secondary | ICD-10-CM | POA: Insufficient documentation

## 2015-02-22 DIAGNOSIS — Y92009 Unspecified place in unspecified non-institutional (private) residence as the place of occurrence of the external cause: Secondary | ICD-10-CM | POA: Diagnosis not present

## 2015-02-22 DIAGNOSIS — E559 Vitamin D deficiency, unspecified: Secondary | ICD-10-CM

## 2015-02-22 DIAGNOSIS — W19XXXA Unspecified fall, initial encounter: Secondary | ICD-10-CM

## 2015-02-22 DIAGNOSIS — S79911A Unspecified injury of right hip, initial encounter: Secondary | ICD-10-CM | POA: Diagnosis not present

## 2015-02-22 DIAGNOSIS — Z9889 Other specified postprocedural states: Secondary | ICD-10-CM

## 2015-02-22 DIAGNOSIS — M25561 Pain in right knee: Secondary | ICD-10-CM | POA: Diagnosis not present

## 2015-02-22 DIAGNOSIS — D508 Other iron deficiency anemias: Secondary | ICD-10-CM

## 2015-02-22 DIAGNOSIS — Z23 Encounter for immunization: Secondary | ICD-10-CM

## 2015-02-22 DIAGNOSIS — Z8719 Personal history of other diseases of the digestive system: Secondary | ICD-10-CM

## 2015-02-22 DIAGNOSIS — X58XXXA Exposure to other specified factors, initial encounter: Secondary | ICD-10-CM | POA: Diagnosis not present

## 2015-02-22 DIAGNOSIS — W1839XA Other fall on same level, initial encounter: Secondary | ICD-10-CM

## 2015-02-22 DIAGNOSIS — M1711 Unilateral primary osteoarthritis, right knee: Secondary | ICD-10-CM | POA: Diagnosis not present

## 2015-02-22 MED ORDER — OXYCODONE-ACETAMINOPHEN 10-325 MG PO TABS
1.0000 | ORAL_TABLET | Freq: Four times a day (QID) | ORAL | Status: DC | PRN
Start: 2015-02-22 — End: 2015-02-22

## 2015-02-22 MED ORDER — OXYCODONE-ACETAMINOPHEN 10-325 MG PO TABS: 1.0000 | ORAL_TABLET | Freq: Four times a day (QID) | ORAL | Status: DC | PRN

## 2015-02-22 NOTE — Patient Instructions (Addendum)
Thanks for your visit today Please do the xrays, and the clinic will call you to schedule an appointment for your bone scan Please follow up in 1 month

## 2015-02-22 NOTE — Assessment & Plan Note (Signed)
She finished her 8 week course (this week would be her last week)

## 2015-02-22 NOTE — Progress Notes (Addendum)
Patient ID: [name], female   DOB: 05-31-1950, 64 y.o.   MRN: [id number]    Subjective:   Patient ID: [name] female   DOB: 11-07-1950 64 y.o.   MRN: [id number]  HPI: [name] is a 64 y.o. woman with HTN, NIDDM, IDA due to AVM+diverticulosis, HCV on Harvoni, Depression, GERD, vitamin D deficiency on weekly dose, and h/o hypokalemia, is here for a right knee pain due to a fall she experienced Sunday night kindly see problem list for full info. Please see the Problem List/ A&P for the status of the patient's chronic medical problems.      Past Medical History  Diagnosis Date  . Hypertension   . Colon polyps   . Stomach problems   . Hypokalemic alkalosis   . Allergic rhinitis   . GERD (gastroesophageal reflux disease)   . Stroke 2010    no residual problems  . Anemia, iron deficiency 05/03/2011    recieves iron infusions  . Anemia   . GI bleeding   . Diverticulitis   . Transfusion history   . Difficulty sleeping     takes trazadone for sleep  . Peripheral vascular disease   . Hepatitis     hep C  . Anxiety   . Shortness of breath     due to anemia  . Diabetes mellitus without complication    Current Outpatient Prescriptions  Medication Sig Dispense Refill  . ACCU-CHEK FASTCLIX LANCETS MISC Check your blood sugar one time a day 102 each 5  . amLODipine (NORVASC) 10 MG tablet Take 1 tablet (10 mg total) by mouth daily. 30 tablet 3  . aspirin EC 81 MG tablet Take 81 mg by mouth daily.     Marland Kitchen atenolol (TENORMIN) 50 MG tablet TAKE 1 TABLET BY MOUTH EVERY MORNING 30 tablet 9  . cetirizine (ZYRTEC) 10 MG tablet Take 10 mg by mouth every morning.     . fluorometholone (FML) 0.1 % ophthalmic suspension Place 1 drop into the left eye 2 (two) times daily.     Marland Kitchen FLUoxetine (PROZAC) 40 MG capsule Take 1 capsule (40 mg total) by mouth daily. 30 capsule 4  . gabapentin (NEURONTIN) 400 MG capsule TAKE 2 CAPSULE BY MOUTH THREE TIMES A DAY 180 capsule 3  . glucose  blood (ACCU-CHEK SMARTVIEW) test strip Check blood sugar  one time a day 50 each 12  . hydrochlorothiazide (HYDRODIURIL) 25 MG tablet Take 1 tablet (25 mg total) by mouth every morning. 90 tablet 3  . lactose free nutrition (BOOST PLUS) LIQD Take 237 mLs by mouth 3 (three) times daily with meals. 237 mL 0  . Ledipasvir-Sofosbuvir (HARVONI) 90-400 MG TABS Take 1 tablet by mouth daily. 28 tablet 2  . lisinopril (PRINIVIL,ZESTRIL) 40 MG tablet Take 1 tablet (40 mg total) by mouth every morning. 90 tablet 4  . metFORMIN (GLUCOPHAGE) 500 MG tablet TAKE 1 TABLET BY MOUTH EVERY DAY WITH BREAKFAST 90 tablet 5  . Nutritional Supplements (ENSURE ACTIVE HIGH PROTEIN) LIQD Take 1 Bottle by mouth 4 (four) times daily -  with meals and at bedtime. 237 mL 12  . omeprazole (PRILOSEC) 20 MG capsule Take 1 capsule (20 mg total) by mouth daily. 30 capsule 11  . oxyCODONE-acetaminophen (PERCOCET) 10-325 MG tablet Take 1 tablet by mouth every 6 (six) hours as needed for pain. 120 tablet 0  . prednisoLONE acetate (PRED FORTE) 1 % ophthalmic suspension Place 1 drop into the right eye daily.     Marland Kitchen  Specialty Vitamins Products (MAGNESIUM, AMINO ACID CHELATE,) 133 MG tablet Take 1 tablet by mouth 2 (two) times daily.    . traZODone (DESYREL) 100 MG tablet Take 100 mg by mouth at bedtime.    . vitamin C (ASCORBIC ACID) 500 MG tablet Take 500 mg by mouth every morning.    . Vitamin D, Ergocalciferol, (DRISDOL) 50000 UNITS CAPS capsule Take 1 capsule (50,000 Units total) by mouth every 7 (seven) days. 8 capsule 0  . ALREX 0.2 % SUSP     . potassium chloride 20 MEQ/15ML (10%) SOLN TAKE 15ML BY MOUTH EVERY DAY (Patient not taking: Reported on 02/22/2015) 450 mL 1  . traMADol (ULTRAM) 50 MG tablet TK 1 T PO  Q 6 H PRN  0   No current facility-administered medications for this visit.   Family History  Problem Relation Age of Onset  . Cancer Father     unknown  . Hypertension Mother   . Diverticulitis Mother    Social  History   Social History  . Marital Status: Widowed    Spouse Name: N/A  . Number of Children: N/A  . Years of Education: N/A   Social History Main Topics  . Smoking status: Current Every Day Smoker -- 0.20 packs/day for 17 years    Types: Cigarettes  . Smokeless tobacco: Never Used     Comment: wants to quit.  Smoking less 1 pk per week.  1-2 cigs per day  . Alcohol Use: 0.0 oz/week    0 Standard drinks or equivalent per week     Comment: Occasionally  . Drug Use: No     Comment: Thanksgiving and Christmas  Holidays 2014 hasn't had any since  . Sexual Activity: Not Asked   Other Topics Concern  . None   Social History Narrative   Review of Systems: Review of Systems  Constitutional: Negative.   HENT: Negative.   Cardiovascular: Negative for chest pain and leg swelling.  Gastrointestinal: Negative for nausea and vomiting.  Musculoskeletal: Positive for joint pain and falls.  Neurological: Negative for dizziness, focal weakness, loss of consciousness and headaches.  Psychiatric/Behavioral: The patient is nervous/anxious.     Objective:  Physical Exam: Filed Vitals:   02/22/15 0934 02/22/15 1033  BP: 162/79 154/75  Pulse: 96 78  Temp: 97.9 F (36.6 C)   TempSrc: Oral   SpO2: 97%    Physical Exam  Constitutional: She is oriented to person, place, and time. She appears well-developed and well-nourished.  In wheelchair, left bka 7 years back, pt did not b ring prosthesis, here with an aide   HENT:  Head: Normocephalic and atraumatic.  Neck: Normal range of motion. Neck supple.  Cardiovascular: Normal rate, regular rhythm and normal heart sounds.   No murmur heard. Pulmonary/Chest: Effort normal and breath sounds normal. She has no wheezes.  Neurological: She is alert and oriented to person, place, and time.  Skin: Skin is warm.  Psychiatric: She has a normal mood and affect.  MSK: right knee- some effusion and tender to palpation above the knee, Limited ROM No  ttp on the hip but difficult to examine the hip   Assessment & Plan:   Please see problem based charting for assessment and plan

## 2015-02-22 NOTE — Telephone Encounter (Signed)
Rx was given by Dr Tiburcio Pea at appt time 02/22/15.

## 2015-02-22 NOTE — Assessment & Plan Note (Addendum)
Pt is here because of a fall she experienced Sunday night. She was removing her left leg prosthesis and then suddenly she felt a little lightheaded and her right knee gave out and then she fell, so she was complaining of right knee pain.  She did not hit her head, there was no LOC. She has a history of recurrent falls, last one was 2 months back. She did have a right knee xray done 2 years ago which showed osteoporotic changes and some osteoarthritis Pt was found to be vitamin d deficient with a level of 10, 8 weeks ago, and has finished her weekly course of therapy.  Previously, she was on potassium supplementation due to hypokalemia but she was not taking the K+ recently.   She has been compliant with her antihypertensives and is on a 4 drug regimen, although her BP was mildly elevated.   Orthostatics could not be done as she did not have the prosthesis and she was int he wheel chair, also Get-up-go test could not be performed also due to this  Possibly the fall etiology might be due to a gait issue vs dizziness secondary to hypovolemia 2/2 intensive antiHTN therapy as amlodipine was just recently added, vs dizziness secondary to anemia, or hypokalemia.  Her right knee exam had limited ROM and tenderness to palpation, and she is at a high risk for developing fractures due to the history of osteoporosis- she has never been on a bisphosphonate therapy   Her FRAX score is 8.4% risk of developing a major osteoporotic frature without her DEXA data, (probably higher since she has fallen multiple times)  Plan -CBC to check hemoglobin -BMP -Given her percoset 10-325 120 pills - she said she will run out this Friday- so the prescription is until October end -Xrays of right knee and right hip -DEXA scan, need bisphosphonate therapy (most likely), if the T score shows the need -Physical therapy referral

## 2015-02-23 ENCOUNTER — Encounter (HOSPITAL_COMMUNITY): Payer: Self-pay

## 2015-02-23 ENCOUNTER — Encounter: Payer: Self-pay | Admitting: Internal Medicine

## 2015-02-23 ENCOUNTER — Ambulatory Visit (INDEPENDENT_AMBULATORY_CARE_PROVIDER_SITE_OTHER): Payer: Medicare Other | Admitting: Internal Medicine

## 2015-02-23 ENCOUNTER — Telehealth: Payer: Self-pay | Admitting: Internal Medicine

## 2015-02-23 ENCOUNTER — Observation Stay (HOSPITAL_COMMUNITY)
Admission: AD | Admit: 2015-02-23 | Discharge: 2015-02-26 | Disposition: A | Discharge status: Home Health | Payer: Medicare Other | Source: Ambulatory Visit | Attending: Student in an Organized Health Care Education/Training Program | Admitting: Student in an Organized Health Care Education/Training Program

## 2015-02-23 VITALS — BP 147/73 | HR 87 | Temp 99.0°F | Ht 63.0 in

## 2015-02-23 DIAGNOSIS — R42 Dizziness and giddiness: Secondary | ICD-10-CM | POA: Insufficient documentation

## 2015-02-23 DIAGNOSIS — D509 Iron deficiency anemia, unspecified: Secondary | ICD-10-CM | POA: Insufficient documentation

## 2015-02-23 DIAGNOSIS — Z880 Allergy status to penicillin: Secondary | ICD-10-CM | POA: Diagnosis not present

## 2015-02-23 DIAGNOSIS — I10 Essential (primary) hypertension: Secondary | ICD-10-CM | POA: Diagnosis not present

## 2015-02-23 DIAGNOSIS — D500 Iron deficiency anemia secondary to blood loss (chronic): Secondary | ICD-10-CM | POA: Diagnosis present

## 2015-02-23 DIAGNOSIS — K5730 Diverticulosis of large intestine without perforation or abscess without bleeding: Secondary | ICD-10-CM | POA: Insufficient documentation

## 2015-02-23 DIAGNOSIS — F1990 Other psychoactive substance use, unspecified, uncomplicated: Secondary | ICD-10-CM | POA: Diagnosis present

## 2015-02-23 DIAGNOSIS — S78112A Complete traumatic amputation at level between left hip and knee, initial encounter: Secondary | ICD-10-CM | POA: Diagnosis present

## 2015-02-23 DIAGNOSIS — Q2733 Arteriovenous malformation of digestive system vessel: Principal | ICD-10-CM | POA: Insufficient documentation

## 2015-02-23 DIAGNOSIS — F17210 Nicotine dependence, cigarettes, uncomplicated: Secondary | ICD-10-CM | POA: Insufficient documentation

## 2015-02-23 DIAGNOSIS — D649 Anemia, unspecified: Secondary | ICD-10-CM | POA: Diagnosis not present

## 2015-02-23 DIAGNOSIS — D508 Other iron deficiency anemias: Secondary | ICD-10-CM | POA: Diagnosis present

## 2015-02-23 DIAGNOSIS — E119 Type 2 diabetes mellitus without complications: Secondary | ICD-10-CM | POA: Insufficient documentation

## 2015-02-23 DIAGNOSIS — F329 Major depressive disorder, single episode, unspecified: Secondary | ICD-10-CM | POA: Insufficient documentation

## 2015-02-23 DIAGNOSIS — B1920 Unspecified viral hepatitis C without hepatic coma: Secondary | ICD-10-CM | POA: Insufficient documentation

## 2015-02-23 DIAGNOSIS — Z7982 Long term (current) use of aspirin: Secondary | ICD-10-CM | POA: Diagnosis not present

## 2015-02-23 DIAGNOSIS — Z89612 Acquired absence of left leg above knee: Secondary | ICD-10-CM | POA: Insufficient documentation

## 2015-02-23 DIAGNOSIS — Z8673 Personal history of transient ischemic attack (TIA), and cerebral infarction without residual deficits: Secondary | ICD-10-CM | POA: Diagnosis not present

## 2015-02-23 DIAGNOSIS — Z8619 Personal history of other infectious and parasitic diseases: Secondary | ICD-10-CM | POA: Diagnosis present

## 2015-02-23 DIAGNOSIS — K31819 Angiodysplasia of stomach and duodenum without bleeding: Secondary | ICD-10-CM

## 2015-02-23 DIAGNOSIS — R531 Weakness: Secondary | ICD-10-CM | POA: Insufficient documentation

## 2015-02-23 DIAGNOSIS — B182 Chronic viral hepatitis C: Secondary | ICD-10-CM

## 2015-02-23 DIAGNOSIS — G546 Phantom limb syndrome with pain: Secondary | ICD-10-CM | POA: Insufficient documentation

## 2015-02-23 HISTORY — DX: Diverticulosis of intestine, part unspecified, without perforation or abscess without bleeding: K57.90

## 2015-02-23 HISTORY — DX: Unspecified viral hepatitis C without hepatic coma: B19.20

## 2015-02-23 LAB — COMPREHENSIVE METABOLIC PANEL
ALBUMIN: 3.2 g/dL — AB (ref 3.5–5.0)
ALBUMIN: 3.3 g/dL — AB (ref 3.5–5.0)
ALK PHOS: 80 U/L (ref 38–126)
ALT: 25 U/L (ref 14–54)
ALT: 24 U/L (ref 14–54)
ANION GAP: 7 (ref 5–15)
AST: 27 U/L (ref 15–41)
AST: 29 U/L (ref 15–41)
Alkaline Phosphatase: 76 U/L (ref 38–126)
Anion gap: 7 (ref 5–15)
BILIRUBIN TOTAL: 0.3 mg/dL (ref 0.3–1.2)
BILIRUBIN TOTAL: 0.3 mg/dL (ref 0.3–1.2)
BUN: 11 mg/dL (ref 6–20)
BUN: 10 mg/dL (ref 6–20)
CALCIUM: 8.8 mg/dL — AB (ref 8.9–10.3)
CALCIUM: 8.7 mg/dL — AB (ref 8.9–10.3)
CO2: 27 mmol/L (ref 22–32)
CO2: 28 mmol/L (ref 22–32)
CREATININE: 0.8 mg/dL (ref 0.44–1.00)
Chloride: 103 mmol/L (ref 101–111)
Chloride: 100 mmol/L — ABNORMAL LOW (ref 101–111)
Creatinine, Ser: 0.87 mg/dL (ref 0.44–1.00)
GFR calc Af Amer: >60 mL/min (ref >60)
GLUCOSE: 137 mg/dL — AB (ref 65–99)
Glucose, Bld: 119 mg/dL — ABNORMAL HIGH (ref 65–99)
POTASSIUM: 3.5 mmol/L (ref 3.5–5.1)
Potassium: 3.7 mmol/L (ref 3.5–5.1)
Sodium: 137 mmol/L (ref 135–145)
Sodium: 135 mmol/L (ref 135–145)
TOTAL PROTEIN: 7 g/dL (ref 6.5–8.1)
TOTAL PROTEIN: 6.5 g/dL (ref 6.5–8.1)

## 2015-02-23 LAB — CBC
HCT: 25 % — ABNORMAL LOW (ref 36.0–46.0)
HEMATOCRIT: 24.1 % — AB (ref 34.0–46.6)
HEMATOCRIT: 23.9 % — AB (ref 36.0–46.0)
HEMOGLOBIN: 7.4 g/dL — AB (ref 12.0–15.0)
Hemoglobin: 7.4 g/dL — ABNORMAL LOW (ref 11.1–15.9)
Hemoglobin: 7.7 g/dL — ABNORMAL LOW (ref 12.0–15.0)
MCH: 25.3 pg — ABNORMAL LOW (ref 26.6–33.0)
MCH: 25 pg — AB (ref 26.0–34.0)
MCH: 24.9 pg — ABNORMAL LOW (ref 26.0–34.0)
MCHC: 30.7 g/dL — ABNORMAL LOW (ref 31.5–35.7)
MCHC: 30.8 g/dL (ref 30.0–36.0)
MCHC: 31 g/dL (ref 30.0–36.0)
MCV: 83 fL (ref 79–97)
MCV: 81.2 fL (ref 78.0–100.0)
MCV: 80.5 fL (ref 78.0–100.0)
Platelets: 370 10*3/uL (ref 150–379)
Platelets: 360 10*3/uL (ref 150–400)
Platelets: 295 10*3/uL (ref 150–400)
RBC: 2.92 x10E6/uL — AB (ref 3.77–5.28)
RBC: 3.08 MIL/uL — ABNORMAL LOW (ref 3.87–5.11)
RBC: 2.97 MIL/uL — AB (ref 3.87–5.11)
RDW: 17.3 % — ABNORMAL HIGH (ref 12.3–15.4)
RDW: 16.5 % — ABNORMAL HIGH (ref 11.5–15.5)
RDW: 16.4 % — ABNORMAL HIGH (ref 11.5–15.5)
WBC: 5.5 10*3/uL (ref 3.4–10.8)
WBC: 5.2 10*3/uL (ref 4.0–10.5)
WBC: 4.9 10*3/uL (ref 4.0–10.5)

## 2015-02-23 LAB — BMP8+ANION GAP
ANION GAP: 18 mmol/L (ref 10.0–18.0)
BUN / CREAT RATIO: 19 (ref 11–26)
BUN: 17 mg/dL (ref 8–27)
CO2: 24 mmol/L (ref 18–29)
CREATININE: 0.89 mg/dL (ref 0.57–1.00)
Calcium: 9.1 mg/dL (ref 8.7–10.3)
Chloride: 101 mmol/L (ref 97–108)
GFR calc Af Amer: 80 mL/min/{1.73_m2} (ref >59)
GFR calc non Af Amer: 70 mL/min/{1.73_m2} (ref >59)
Glucose: 144 mg/dL — ABNORMAL HIGH (ref 65–99)
Potassium: 4.1 mmol/L (ref 3.5–5.2)
Sodium: 143 mmol/L (ref 134–144)

## 2015-02-23 LAB — PROTIME-INR
INR: 1.13 (ref 0.00–1.49)
Prothrombin Time: 14.7 seconds (ref 11.6–15.2)

## 2015-02-23 LAB — IRON AND TIBC
Iron: 14 ug/dL — ABNORMAL LOW (ref 28–170)
SATURATION RATIOS: 3 % — AB (ref 10.4–31.8)
TIBC: 543 ug/dL — AB (ref 250–450)
UIBC: 529 ug/dL

## 2015-02-23 LAB — RETICULOCYTES
RBC.: 2.97 MIL/uL — AB (ref 3.87–5.11)
RETIC CT PCT: 2.6 % (ref 0.4–3.1)
Retic Count, Absolute: 77.2 10*3/uL (ref 19.0–186.0)

## 2015-02-23 LAB — MRSA PCR SCREENING: MRSA by PCR: NEGATIVE

## 2015-02-23 LAB — MAGNESIUM: MAGNESIUM: 1.4 mg/dL — AB (ref 1.7–2.4)

## 2015-02-23 LAB — FERRITIN: FERRITIN: 7 ng/mL — AB (ref 11–307)

## 2015-02-23 LAB — APTT: APTT: 43 s — AB (ref 24–37)

## 2015-02-23 LAB — PREPARE RBC (CROSSMATCH)

## 2015-02-23 MED ORDER — SODIUM CHLORIDE 0.9 % IV SOLN
Freq: Once | INTRAVENOUS | Status: AC
  Administered 2015-02-25: 13:00:00 via INTRAVENOUS

## 2015-02-23 MED ORDER — MAGNESIUM SULFATE 2 GM/50ML IV SOLN
2.0000 g | Freq: Once | INTRAVENOUS | Status: DC
  Filled 2015-02-23: qty 50

## 2015-02-23 MED ORDER — ASPIRIN EC 81 MG PO TBEC
81.0000 mg | DELAYED_RELEASE_TABLET | Freq: Every day | ORAL | Status: DC
  Administered 2015-02-25 – 2015-02-26 (×2): 81 mg via ORAL
  Filled 2015-02-23 (×2): qty 1

## 2015-02-23 MED ORDER — OXYCODONE-ACETAMINOPHEN 10-325 MG PO TABS: 1.0000 | ORAL_TABLET | Freq: Four times a day (QID) | ORAL | Status: DC | PRN

## 2015-02-23 MED ORDER — TRAZODONE HCL 100 MG PO TABS
100.0000 mg | ORAL_TABLET | Freq: Every day | ORAL | Status: DC
  Administered 2015-02-23 – 2015-02-25 (×3): 100 mg via ORAL
  Filled 2015-02-23 (×3): qty 1

## 2015-02-23 MED ORDER — ATENOLOL 50 MG PO TABS
50.0000 mg | ORAL_TABLET | Freq: Every morning | ORAL | Status: DC
  Administered 2015-02-25 – 2015-02-26 (×2): 50 mg via ORAL
  Filled 2015-02-23 (×2): qty 1

## 2015-02-23 MED ORDER — OXYCODONE-ACETAMINOPHEN 5-325 MG PO TABS
1.0000 | ORAL_TABLET | Freq: Four times a day (QID) | ORAL | Status: DC | PRN
  Administered 2015-02-23 – 2015-02-26 (×5): 1 via ORAL
  Filled 2015-02-23 (×5): qty 1

## 2015-02-23 MED ORDER — ENOXAPARIN SODIUM 40 MG/0.4ML ~~LOC~~ SOLN
40.0000 mg | SUBCUTANEOUS | Status: DC
  Administered 2015-02-23: 40 mg via SUBCUTANEOUS
  Filled 2015-02-23: qty 0.4

## 2015-02-23 MED ORDER — FLUOROMETHOLONE 0.1 % OP SUSP
1.0000 [drp] | Freq: Two times a day (BID) | OPHTHALMIC | Status: DC
  Filled 2015-02-23: qty 5

## 2015-02-23 MED ORDER — OXYCODONE HCL 5 MG PO TABS
5.0000 mg | ORAL_TABLET | Freq: Four times a day (QID) | ORAL | Status: DC | PRN
  Administered 2015-02-23 – 2015-02-26 (×5): 5 mg via ORAL
  Filled 2015-02-23 (×5): qty 1

## 2015-02-23 MED ORDER — PANTOPRAZOLE SODIUM 40 MG IV SOLR
40.0000 mg | Freq: Two times a day (BID) | INTRAVENOUS | Status: DC
  Administered 2015-02-23 – 2015-02-24 (×2): 40 mg via INTRAVENOUS
  Filled 2015-02-23 (×2): qty 40

## 2015-02-23 MED ORDER — PREDNISOLONE ACETATE 1 % OP SUSP
1.0000 [drp] | Freq: Every day | OPHTHALMIC | Status: DC
  Filled 2015-02-23: qty 1

## 2015-02-23 MED ORDER — FLUOXETINE HCL 20 MG PO TABS
40.0000 mg | ORAL_TABLET | Freq: Every day | ORAL | Status: DC
  Administered 2015-02-25 – 2015-02-26 (×2): 40 mg via ORAL
  Filled 2015-02-23 (×5): qty 2

## 2015-02-23 MED ORDER — ENSURE ENLIVE PO LIQD
1.0000 | Freq: Three times a day (TID) | ORAL | Status: DC
  Administered 2015-02-23 (×2): 237 mL via ORAL

## 2015-02-23 MED ORDER — GABAPENTIN 400 MG PO CAPS: 400.0000 mg | ORAL_CAPSULE | Freq: Three times a day (TID) | ORAL | Status: DC

## 2015-02-23 MED ORDER — FUROSEMIDE 10 MG/ML IJ SOLN
40.0000 mg | Freq: Two times a day (BID) | INTRAMUSCULAR | Status: DC
  Filled 2015-02-23: qty 4

## 2015-02-23 MED ORDER — GABAPENTIN 400 MG PO CAPS
400.0000 mg | ORAL_CAPSULE | Freq: Three times a day (TID) | ORAL | Status: DC
Start: 2015-02-23 — End: 2015-02-26
  Administered 2015-02-23 – 2015-02-26 (×8): 400 mg via ORAL
  Filled 2015-02-23 (×9): qty 1

## 2015-02-23 MED ORDER — LEDIPASVIR-SOFOSBUVIR 90-400 MG PO TABS
1.0000 | ORAL_TABLET | Freq: Every day | ORAL | Status: DC
  Administered 2015-02-26: 1 via ORAL
  Filled 2015-02-23: qty 1

## 2015-02-23 NOTE — Progress Notes (Signed)
Patient arrived to [facility]. Patient denies pain and all questions answered. Patient oriented to unit and Fall Safety Plan signed. Skin assessment completed with Sheppard Evens RN. Nursing staff will continue to monitor. Earleen Reaper, RN

## 2015-02-23 NOTE — H&P (Signed)
Date: 02/23/2015               Patient Name:  [name] MRN: [id number]  DOB: Oct 05, 1950 Age / Sex: 64 y.o., female   PCP: [name], MD         Medical Service: Internal Medicine Teaching Service         Attending Physician: Dr. Axel Filler, MD    First Contact: Dr. Marlowe Sax Pager: [phone]  Second Contact: Dr. 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 Patient refuses to speak and as such history obtained from [facility] clinic notes. Patient presented to the [facility] yesterday complaining of lightheadedness and dizziness. CBC revealed a Hgb of 7.4 today and as such patient was asked to come into the hospital. Patient has been receiving ferraheme transfuions every 3-4 months at [place] [facility] center and her next appointment is on 03/02/15. Last PRBC transfusion was in 2012. She has a history of AV malformations and history of hernia repair done in march with post-op complications requiring wound vac. Clinic note also states patient reported having some episodes of melena last week.   Meds: No current facility-administered medications for this encounter.    Allergies: Allergies as of 02/23/2015 - Review Complete 02/23/2015  Allergen Reaction Noted  . Ciprofloxacin Hives and Swelling 12/25/2011  . Morphine and related Other (See Comments) 12/25/2011  . Penicillins Hives and Swelling   . Codeine Hives and Swelling   . Feraheme [ferumoxytol] Itching 11/17/2012   Past Medical History  Diagnosis Date  . Hypertension   . Colon polyps   . Stomach problems   . Hypokalemic alkalosis   . Allergic rhinitis   . GERD (gastroesophageal reflux  disease)   . Stroke 2010    no residual problems  . Anemia, iron deficiency 05/03/2011    recieves iron infusions  . Anemia   . GI bleeding   . Diverticulitis   . Transfusion history   . Difficulty sleeping     takes trazadone for sleep  . Peripheral vascular disease   . Hepatitis     hep C  . Anxiety   . Shortness of breath     due to anemia  . Diabetes mellitus without complication    Past Surgical History  Procedure Laterality Date  . Leg amputation above knee    . Esophagogastroduodenoscopy  05/05/2011    Procedure: ESOPHAGOGASTRODUODENOSCOPY (EGD);  Surgeon: Zenovia Jarred, MD;  Location: [facility];  Service: Gastroenterology;  Laterality: N/A;  Dr. Hilarie Fredrickson will do procedure for Dr. Benson Norway Saturday.  . Esophagogastroduodenoscopy  05/08/2011    Procedure: ESOPHAGOGASTRODUODENOSCOPY (EGD);  Surgeon: Beryle Beams;  Location: [facility];  Service: Endoscopy;  Laterality: N/A;  . Esophagogastroduodenoscopy  06/07/2011    Procedure: ESOPHAGOGASTRODUODENOSCOPY (EGD);  Surgeon: Beryle Beams, MD;  Location: [facility];  Service: Endoscopy;  Laterality: N/A;  . Hot hemostasis  06/07/2011    Procedure: HOT HEMOSTASIS (ARGON PLASMA COAGULATION/BICAP);  Surgeon: Beryle Beams, MD;  Location: [facility];  Service: Endoscopy;  Laterality: N/A;  . Esophagogastroduodenoscopy  12/20/2011    Procedure: ESOPHAGOGASTRODUODENOSCOPY (EGD);  Surgeon: Beryle Beams, MD;  Location: [facility];  Service: Endoscopy;  Laterality: N/A;  . Flexible sigmoidoscopy  12/21/2011    Procedure: FLEXIBLE SIGMOIDOSCOPY;  Surgeon: Beryle Beams, MD;  Location: [facility];  Service: Endoscopy;  Laterality: N/A;  . Carpal tunnel release      rt hand  . Partial colectomy  02/15/2012    Procedure: PARTIAL COLECTOMY;  Surgeon: Harl Bowie, MD;  Location: [facility];  Service: General;  Laterality: N/A;  . Laparotomy  02/16/2012    Procedure: EXPLORATORY LAPAROTOMY;  Surgeon: Edward Jolly, MD;  Location:  [facility];  Service: General;  Laterality: N/A;  oversewing of anastomotic leak and rigid sigmoidoscopy  . Enteroscopy N/A 12/04/2012    Procedure: ENTEROSCOPY;  Surgeon: Beryle Beams, MD;  Location: [facility];  Service: Endoscopy;  Laterality: N/A;  . Enteroscopy N/A 12/18/2012    Procedure: ENTEROSCOPY;  Surgeon: Beryle Beams, MD;  Location: [facility];  Service: Endoscopy;  Laterality: N/A;  . Eye surgery Bilateral     cataracts  . Tonsillectomy    . Abdominal hysterectomy    . Insertion of mesh N/A 06/01/2014    Procedure: INSERTION OF MESH;  Surgeon: Coralie Keens, MD;  Location: [facility];  Service: General;  Laterality: N/A;  . Incisional hernia repair N/A 06/01/2014    Procedure: ATTEMPTED LAPAROSCOPIC AND OPEN INCISIONAL HERNIA REPAIR WITH MESH;  Surgeon: Coralie Keens, MD;  Location: [facility];  Service: General;  Laterality: N/A;  . Laparotomy N/A 06/21/2014    Procedure: ABDOMINAL WOUND EXPLORATION;  Surgeon: Coralie Keens, MD;  Location: [facility];  Service: General;  Laterality: N/A;  . Application of wound vac N/A 06/21/2014    Procedure: APPLICATION OF WOUND VAC;  Surgeon: Coralie Keens, MD;  Location: [facility];  Service: General;  Laterality: N/A;   Family History  Problem Relation Age of Onset  . Cancer Father     unknown  . Hypertension Mother   . Diverticulitis Mother    Social History   Social History  . Marital Status: Widowed    Spouse Name: N/A  . Number of Children: N/A  . Years of Education: N/A   Occupational History  . Not on file.   Social History Main Topics  . Smoking status: Current Every Day Smoker -- 0.20 packs/day for 17 years    Types: Cigarettes  . Smokeless tobacco: Never Used     Comment: wants to quit.  Smoking less 1 pk per week.  1-2 cigs per day  . Alcohol Use: 0.0 oz/week    0 Standard drinks or equivalent per week     Comment: Occasionally  . Drug Use: No     Comment: Thanksgiving and Christmas  Holidays 2014 hasn't had any since    . Sexual Activity: Yes    Birth Control/ Protection: Post-menopausal   Other Topics Concern  . Not on file   Social History Narrative    Review of Systems: Review of Systems  Unable to perform ROS: patient nonverbal  Patient refused to answer any questions.  Physical Exam: Blood pressure 159/79, pulse 85, temperature 98.9 F (37.2 C), temperature source Oral, resp. rate 22, height 5\' 3"  (1.6 m), SpO2 100 %. Physical Exam  Constitutional: She appears well-developed and well-nourished. No distress.  Patient refused to be examined.   Lab results: Basic Metabolic Panel:  Recent Labs  02/22/15 1101  NA 143  K 4.1  CL 101  CO2 24  GLUCOSE 144*  BUN 17  CREATININE 0.89  CALCIUM 9.1   CBC:  Recent Labs  02/22/15 1101 02/23/15 1040  WBC 5.5 5.2  HGB  --  7.7*  HCT 24.1* 25.0*  MCV  --  81.2  PLT  --  360   Urine Drug Screen: Drugs of Abuse     Component Value Date/Time   LABOPIA NONE DETECTED 03/05/2012 1643   LABOPIA NEG 06/20/2010 2242   COCAINSCRNUR NONE DETECTED 03/05/2012 1643   COCAINSCRNUR POS* 06/20/2010 2242   LABBENZ NONE DETECTED 03/05/2012 1643   LABBENZ NEG 06/20/2010 2242   AMPHETMU NONE DETECTED 03/05/2012 1643   AMPHETMU NEG 06/20/2010 2242   THCU NONE DETECTED 03/05/2012 1643   LABBARB NONE DETECTED 03/05/2012 1643    Imaging results:  Dg Knee Complete 4 Views Right  02/22/2015   CLINICAL DATA:  fall with history of osteoporosis i fell 2 days ago on rt knee,been having ant rt knee pain  : RIGHT KNEE - COMPLETE 4+ VIEW  COMPARISON:  02/11/2013  FINDINGS: Mild narrowing and osteophyte formation laterally. Moderate osteophyte formation and narrowing medial compartment. Diffuse osteopenia. Moderate osteophyte formation in the patellofemoral compartment. Very small joint effusion.  IMPRESSION: Moderate tricompartmental arthritic change.   Electronically Signed   By: Skipper Cliche M.D.   On: 02/22/2015 14:03   Dg Hip Unilat With Pelvis 2-3  Views Right  02/22/2015   CLINICAL DATA:  Fall at home.  EXAM: DG HIP (WITH OR WITHOUT PELVIS) 2-3V RIGHT  COMPARISON:  None.  FINDINGS: There is no evidence of hip fracture or dislocation. There is no evidence of arthropathy or other focal bone abnormality.  IMPRESSION: Negative.   Electronically Signed   By: Kathreen Devoid   On: 02/22/2015 14:04   Assessment & Plan by Problem: Active Problems:   * No active hospital problems. *  Symptomatic IDA Hemoglobin 7.7 and hematocrit 25.0 upon admission today. Normal MCV. Ferritin low, iron low, saturation low. TIBC high. Magnesium low. Patient presented to the clinic yesterday complaining of lightheadedness and dizziness. The patient's acute drop in hemoglobin could likely be due to a new GI bleed. She does have a history of AV malformations and diverticulosis and is due for ferraheme transfuions on 10/6. Blood pressure 147/73 on admission.  -Blood transfusion (1 unit) today  -Magnesium repleted -Pantoprazole 40 mg IV every 12 hrs for now. We were informed by pharmacy that pantoprazole in high doses has an interaction with patient's hepatitis C drug Harvoni, as such, cannot take twice-daily dosing at home.  -Pending labs: CBC in a.m., BMP and am, magnesium, FOBT  HTN -Continue atenolol 50 mg daily  Depression -Continue fluoxetine 40 mg daily -Continue trazodone 100 mg daily  Hepatitis C -Continue Ledipasvir-Sofosbuvir 90-400 mg daily  Diet: regular   Code: FULL  Dispo: Disposition is deferred at this time, awaiting improvement of current medical problems. Anticipated discharge in approximately 1-2 day(s).   The patient does have a current PCP ([name], MD) and does need an [facility] hospital follow-up appointment after discharge.  The patient does not have transportation limitations that hinder transportation to clinic appointments.  Signed: Shela Leff, MD 02/23/2015, 1:26 PM

## 2015-02-23 NOTE — Patient Instructions (Signed)
Admit to hospital 

## 2015-02-23 NOTE — Telephone Encounter (Signed)
Agree with plan.  Please follow with patient if she has not shown up by early this afternoon.  Another option would be to visit the ED or Urgent Care if she is not able to make it to the clinic during business hours.

## 2015-02-23 NOTE — Progress Notes (Signed)
Report given to Nurse on [address]. Patient transferred via wheeelchair to [facility].  Alert oriented.  Sander Nephew, RN 02/23/2015 12:25 PM.

## 2015-02-23 NOTE — H&P (Signed)
Internal Medicine Attending Admission Note  I saw and evaluated the patient. I reviewed the resident's note and I agree with the resident's findings and plan as documented in the resident's note.  Assessment & Plan by Problem:  Active Problems:   Symptomatic anemia   Symptomatic Anemia: Etiology of progressive anemia most likely blood loss due to occult GI bleeding. She has a history of diverticulosis which could be the source of her bleeding. No dyspepsia or NSAID use. She does have hepatitis C but no evidence of cirrhosis. Last EGD 2013 without any ulcerations or varices. Last sigmoidoscopy 2013 showing only diverticulosis with no telangiectasias.  - Transfuse 1 unit PRBC  - Increase PPI to omeprazole 40 mg twice a day  - Check ferritin and reticulocyte count. - Monitor stool output overnight for melena or hematochezia.  - We will need follow-up with GI clinic for repeat endoscopy and probably colonoscopy.  - Given her history of CVA, ACS, and PAD, benefit of continued aspirin use for secondary prevention probably outweighs the risk. Would continue aspirin for now and attempt to intervene on source of bleeding with GI workup.   Chief Complaint(s): black stools  History - key components related to admission:  Patient is a 64 year old woman with a history of colonic telangiectasias and diverticulosis who was called into the hospital today because of worsening symptomatic anemia. Patient reports a history of lower GI bleeding and a chronic iron deficiency anemia. She usually goes to the [facility] for iron infusions every few weeks. She presented to her primary care physician with increasing dyspnea on exertion and generalized fatigue. Recheck of her blood counts showed hemoglobin down from 11 to 7.4. She also reports having increased number of dark stools. Usually has about 3-4 bowel movements per day. This is been true for the last several weeks. The color of the stools does vary, sometimes  they are light green color, sometimes a dark black color. Denies any upper gastric pain or discomfort. No early satiety. Denies use of NSAIDs. Does use aspirin 81 mg every day because of a stroke and heart attack she says happened several years ago. Denies having any stents in place. No chest pain or shortness of breath. No fevers or chills. Otherwise doing well at home.   Lab results: Reviewed in Epic  Physical Exam - key components related to admission:  Filed Vitals:   02/23/15 1316  BP: 159/79  Pulse: 85  Temp: 98.9 F (37.2 C)  TempSrc: Oral  Resp: 22  Height: 5\' 3"  (1.6 m)  SpO2: 100%    Gen: Well appearing older woman, no distress ENT: MMM, no lesions Neck: Normal CV: RRR, 3/6 early systolic murmur at RUSB Abd: Soft, non-tender, non-distended Ext: Warm, well perfused, no edema, right BKA Skin: no rashes Neuro: alert and oriented spontaneously, no focal deficits

## 2015-02-23 NOTE — Assessment & Plan Note (Addendum)
Pt says that she was feeling lightheaded and dizzy when I called her to review her hemoglobin status and the need for transfusion. She mentioned that she gets ferraheme infusions every 3-4 months at [facility] onc[facility], and her next appt is on October 5th. Last PRBC tranfusion was in 2012. Pt has history of av malformations, and has been worked up in the past. She also had ventral hernia repair done in March with some postop complications requiring wound vac. Looked c/d/i.   In the clinic, Repeat CBC still shows HgB of 7.7. Pt mentioned that she has been having melena intermittently last week. She denies n/v/d/c. No BRBPR  Please see "fall" problem list for full HPi from yesterday.   Plan: Admit to med.surg for PRBC transfusion -consider repeat ferritin and ferraheme infusion.  Her ferritin back in July was 60. -can consider EGD as she has history of AVM and she complained of melena.  -can consult GI- for EGD- she was last seen there in July 2014, but was discharged from the practice for some reason and has not followed up since

## 2015-02-23 NOTE — Telephone Encounter (Signed)
CBC shows HgB of 7.4 Called the patient at home and Pt says that she was feeling lightheaded, I explained her that it is most likely due to the acute drop in hemoglobin, and that she may be losing blood somewhere. Pt says that she usually receives tranfusions at the cancer center and she has an appointment on Oct 4th.  I told her that we recommend she come in to the clinic now to recheck her hemoglobin and to receive a unit or more of PRBC Pt agreed to come in once her caregiver comes in at 9.30 AM

## 2015-02-23 NOTE — Progress Notes (Addendum)
Patient ID: [name], female   DOB: 1950-05-29, 64 y.o.   MRN: [id number]    Subjective:   Patient ID: [name] female   DOB: [birth year] 64 y.o.   MRN: [id number]  HPI: [name] is a 64 y.o. 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  She is here for follow up from yesterday after her CBC showed a HgB of 7.4 this morning. She says that she was feeling more lightheaded and was feeling very dizzy.  Please see the Problem List/ A&P for the status of the patient's chronic medical problems.   Past Medical History  Diagnosis Date  . Hypertension   . Colon polyps   . Stomach problems   . Hypokalemic alkalosis   . Allergic rhinitis   . GERD (gastroesophageal reflux disease)   . Stroke 2010    no residual problems  . Anemia, iron deficiency 05/03/2011    recieves iron infusions  . Anemia   . GI bleeding   . Diverticulitis   . Transfusion history   . Difficulty sleeping     takes trazadone for sleep  . Peripheral vascular disease   . Hepatitis     hep C  . Anxiety   . Shortness of breath     due to anemia  . Diabetes mellitus without complication    Current Outpatient Prescriptions  Medication Sig Dispense Refill  . ACCU-CHEK FASTCLIX LANCETS MISC Check your blood sugar one time a day 102 each 5  . ALREX 0.2 % SUSP     . amLODipine (NORVASC) 10 MG tablet Take 1 tablet (10 mg total) by mouth daily. 30 tablet 3  . aspirin EC 81 MG tablet Take 81 mg by mouth daily.     Marland Kitchen atenolol (TENORMIN) 50 MG tablet TAKE 1 TABLET BY MOUTH EVERY MORNING 30 tablet 9  . cetirizine (ZYRTEC) 10 MG tablet Take 10 mg by mouth every morning.     . fluorometholone (FML) 0.1 % ophthalmic suspension Place 1 drop into the left eye 2 (two) times daily.     Marland Kitchen FLUoxetine (PROZAC) 40 MG capsule Take 1 capsule (40 mg total) by mouth daily. 30 capsule 4  . gabapentin (NEURONTIN) 400 MG  capsule TAKE 2 CAPSULE BY MOUTH THREE TIMES A DAY 180 capsule 3  . glucose blood (ACCU-CHEK SMARTVIEW) test strip Check blood sugar  one time a day 50 each 12  . hydrochlorothiazide (HYDRODIURIL) 25 MG tablet Take 1 tablet (25 mg total) by mouth every morning. 90 tablet 3  . lactose free nutrition (BOOST PLUS) LIQD Take 237 mLs by mouth 3 (three) times daily with meals. 237 mL 0  . Ledipasvir-Sofosbuvir (HARVONI) 90-400 MG TABS Take 1 tablet by mouth daily. 28 tablet 2  . lisinopril (PRINIVIL,ZESTRIL) 40 MG tablet Take 1 tablet (40 mg total) by mouth every morning. 90 tablet 4  . metFORMIN (GLUCOPHAGE) 500 MG tablet TAKE 1 TABLET BY MOUTH EVERY DAY WITH BREAKFAST 90 tablet 5  . Nutritional Supplements (ENSURE ACTIVE HIGH PROTEIN) LIQD Take 1 Bottle by mouth 4 (four) times daily -  with meals and at bedtime. 237 mL 12  . omeprazole (PRILOSEC) 20 MG capsule Take 1 capsule (20 mg total) by mouth daily. 30 capsule 11  . oxyCODONE-acetaminophen (PERCOCET) 10-325 MG tablet Take 1 tablet by mouth every 6 (six) hours as needed for pain. 120 tablet 0  .  potassium chloride 20 MEQ/15ML (10%) SOLN TAKE 15ML BY MOUTH EVERY DAY (Patient not taking: Reported on 02/22/2015) 450 mL 1  . prednisoLONE acetate (PRED FORTE) 1 % ophthalmic suspension Place 1 drop into the right eye daily.     Marland Kitchen Specialty Vitamins Products (MAGNESIUM, AMINO ACID CHELATE,) 133 MG tablet Take 1 tablet by mouth 2 (two) times daily.    . traMADol (ULTRAM) 50 MG tablet TK 1 T PO  Q 6 H PRN  0  . traZODone (DESYREL) 100 MG tablet Take 100 mg by mouth at bedtime.    . vitamin C (ASCORBIC ACID) 500 MG tablet Take 500 mg by mouth every morning.    . Vitamin D, Ergocalciferol, (DRISDOL) 50000 UNITS CAPS capsule Take 1 capsule (50,000 Units total) by mouth every 7 (seven) days. 8 capsule 0   No current facility-administered medications for this visit.   Family History  Problem Relation Age of Onset  . Cancer Father     unknown  . Hypertension  Mother   . Diverticulitis Mother    Social History   Social History  . Marital Status: Widowed    Spouse Name: N/A  . Number of Children: N/A  . Years of Education: N/A   Social History Main Topics  . Smoking status: Current Every Day Smoker -- 0.20 packs/day for 17 years    Types: Cigarettes  . Smokeless tobacco: Never Used     Comment: wants to quit.  Smoking less 1 pk per week.  1-2 cigs per day  . Alcohol Use: 0.0 oz/week    0 Standard drinks or equivalent per week     Comment: Occasionally  . Drug Use: No     Comment: Thanksgiving and Christmas  Holidays 2014 hasn't had any since  . Sexual Activity: Not Asked   Other Topics Concern  . None   Social History Narrative   Review of Systems: Review of Systems  Constitutional: Positive for malaise/fatigue. Negative for weight loss.  Respiratory: Negative for cough, shortness of breath and wheezing.   Cardiovascular: Negative for chest pain and palpitations.  Gastrointestinal: Positive for melena. Negative for nausea, vomiting, abdominal pain and diarrhea.  Neurological: Positive for dizziness and headaches. Negative for focal weakness and loss of consciousness.    Objective:  Physical Exam: Filed Vitals:   02/23/15 1044  BP: 147/73  Pulse: 87  Temp: 99 F (37.2 C)  TempSrc: Oral  Height: 5\' 3"  (1.6 m)  SpO2: 100%   Physical Exam  Constitutional: She is oriented to person, place, and time. She appears well-developed.  In wheelchair  HENT:  Head: Normocephalic and atraumatic.  Cardiovascular: Normal rate, regular rhythm, normal heart sounds and intact distal pulses.   No murmur heard. Pulmonary/Chest: Effort normal and breath sounds normal. No respiratory distress.  Abdominal: Soft. Bowel sounds are normal. She exhibits no distension.  Neurological: She is alert and oriented to person, place, and time.    Assessment & Plan:   Please see problem based charting for assessment and plan

## 2015-02-23 NOTE — Progress Notes (Signed)
Spoke with primary MD (Rabbini) about putting pt. On CM diet, ACHS with insulin coverage protocol. MD stated that pt. A1C is within reason and that CBGs are well controlled and do not need to be monitor or pt. Placed on CM diet. Started blood with Marcene Brawn, RN and remained with pt. For first 15 mins. Pt. Asymptomatic with no signs of distress noted. Trimaine,RN to follow up with completion of blood transfusion. No further needs noted at this time.

## 2015-02-24 ENCOUNTER — Other Ambulatory Visit: Payer: Self-pay | Admitting: Internal Medicine

## 2015-02-24 ENCOUNTER — Encounter (HOSPITAL_COMMUNITY): Payer: Self-pay | Admitting: Physician Assistant

## 2015-02-24 DIAGNOSIS — K5730 Diverticulosis of large intestine without perforation or abscess without bleeding: Secondary | ICD-10-CM | POA: Diagnosis not present

## 2015-02-24 DIAGNOSIS — Q2733 Arteriovenous malformation of digestive system vessel: Secondary | ICD-10-CM | POA: Diagnosis not present

## 2015-02-24 DIAGNOSIS — Z8673 Personal history of transient ischemic attack (TIA), and cerebral infarction without residual deficits: Secondary | ICD-10-CM | POA: Diagnosis not present

## 2015-02-24 DIAGNOSIS — B1920 Unspecified viral hepatitis C without hepatic coma: Secondary | ICD-10-CM | POA: Diagnosis not present

## 2015-02-24 DIAGNOSIS — D649 Anemia, unspecified: Secondary | ICD-10-CM

## 2015-02-24 DIAGNOSIS — Z880 Allergy status to penicillin: Secondary | ICD-10-CM | POA: Diagnosis not present

## 2015-02-24 DIAGNOSIS — D509 Iron deficiency anemia, unspecified: Secondary | ICD-10-CM | POA: Diagnosis not present

## 2015-02-24 LAB — BASIC METABOLIC PANEL
ANION GAP: 9 (ref 5–15)
BUN: 15 mg/dL (ref 6–20)
CALCIUM: 8.4 mg/dL — AB (ref 8.9–10.3)
CHLORIDE: 102 mmol/L (ref 101–111)
CO2: 25 mmol/L (ref 22–32)
Creatinine, Ser: 0.93 mg/dL (ref 0.44–1.00)
GFR calc non Af Amer: >60 mL/min (ref >60)
Glucose, Bld: 156 mg/dL — ABNORMAL HIGH (ref 65–99)
POTASSIUM: 3.5 mmol/L (ref 3.5–5.1)
Sodium: 136 mmol/L (ref 135–145)

## 2015-02-24 LAB — CBC
HEMATOCRIT: 25.9 % — AB (ref 36.0–46.0)
HEMOGLOBIN: 8 g/dL — AB (ref 12.0–15.0)
MCH: 24.9 pg — ABNORMAL LOW (ref 26.0–34.0)
MCHC: 30.9 g/dL (ref 30.0–36.0)
MCV: 80.7 fL (ref 78.0–100.0)
PLATELETS: 286 10*3/uL (ref 150–400)
RBC: 3.21 MIL/uL — AB (ref 3.87–5.11)
RDW: 16.6 % — ABNORMAL HIGH (ref 11.5–15.5)
WBC: 8.4 10*3/uL (ref 4.0–10.5)

## 2015-02-24 LAB — HEMOGLOBIN AND HEMATOCRIT, BLOOD
HCT: 26.3 % — ABNORMAL LOW (ref 36.0–46.0)
Hemoglobin: 8.2 g/dL — ABNORMAL LOW (ref 12.0–15.0)

## 2015-02-24 LAB — MAGNESIUM: Magnesium: 1.4 mg/dL — ABNORMAL LOW (ref 1.7–2.4)

## 2015-02-24 LAB — PREPARE RBC (CROSSMATCH)

## 2015-02-24 LAB — VITAMIN D 25 HYDROXY (VIT D DEFICIENCY, FRACTURES): Vit D, 25-Hydroxy: 37.2 ng/mL (ref 30.0–100.0)

## 2015-02-24 MED ORDER — PANTOPRAZOLE SODIUM 40 MG PO TBEC
40.0000 mg | DELAYED_RELEASE_TABLET | Freq: Every day | ORAL | Status: DC
  Administered 2015-02-25 – 2015-02-26 (×2): 40 mg via ORAL
  Filled 2015-02-24 (×2): qty 1

## 2015-02-24 MED ORDER — BOOST / RESOURCE BREEZE PO LIQD
1.0000 | Freq: Three times a day (TID) | ORAL | Status: DC
  Administered 2015-02-24 – 2015-02-26 (×2): 1 via ORAL

## 2015-02-24 MED ORDER — SODIUM CHLORIDE 0.9 % IV SOLN
Freq: Once | INTRAVENOUS | Status: AC
  Administered 2015-02-24: 12:00:00 via INTRAVENOUS

## 2015-02-24 MED ORDER — FERUMOXYTOL INJECTION 510 MG/17 ML
510.0000 mg | INTRAVENOUS | Status: DC
  Administered 2015-02-24: 510 mg via INTRAVENOUS
  Filled 2015-02-24: qty 17

## 2015-02-24 MED ORDER — VITAMIN D 1000 UNITS PO TABS
1000.0000 [IU] | ORAL_TABLET | Freq: Every day | ORAL | Status: DC
  Administered 2015-02-25 – 2015-02-26 (×2): 1000 [IU] via ORAL
  Filled 2015-02-24 (×2): qty 1

## 2015-02-24 NOTE — Care Management Note (Addendum)
Case Management Note  Patient Details  Name: [name] MRN: [id number] Date of Birth: 18-Dec-1950  Subjective/Objective:                  Date:THURSDAY 02-24-15 Spoke with patient at the bedside. Introduced self as Tourist information centre manager and explained role in discharge planning and how to be reached. Verified patient lives in [place] in apartment alone. Verified patient anticipates to go home alone, at time of discharge and will have part-time supervision by niece, caregiver through [place] in [place], [place] through [place] program at this time to best of her knowledge. Patient has DME 2 prosthesis, cane, walker,  And 2 wheelchairs. Expressed potential need for no other DME. Patient denied needing help with their medication. Patient  is driven by [facility] transportation [place], (not [facility]) to MD appointments. Verified patient has PCP Dr Sherlyn Lees in [facility]. Patient states they currently receive [place] aide services through [facility] [place] [place].    Plan: CM will continue to follow for discharge planning and [facility] resources.   Carles Collet RN BSN CM [phone]   Action/Plan:   Expected Discharge Date:                  Expected Discharge Plan:  Home/Self Care  In-House Referral:     Discharge planning Services  CM Consult  Post Acute Care Choice:    Choice offered to:     DME Arranged:    DME Agency:     HH Arranged:    HH Agency:     Status of Service:  In process, will continue to follow  Medicare Important Message Given:    Date Medicare IM Given:    Medicare IM give by:    Date Additional Medicare IM Given:    Additional Medicare Important Message give by:     If discussed at [place] of Stay Meetings, dates discussed:    Additional Comments:  Carles Collet, RN 02/24/2015, 3:36 PM

## 2015-02-24 NOTE — Progress Notes (Signed)
   Subjective: Pt ws seen and examined at bedside this AM. States she is still feeling lightheaded. Denies any HA, CP, palpitations, or SOB. States she had one well-formed green colored BM last night.   Objective: Vital signs in last 24 hours: Filed Vitals:   02/24/15 1145 02/24/15 1208 02/24/15 1345 02/24/15 1531  BP: 128/74 150/76 150/88 175/77  Pulse: 68 65 103 101  Temp: 98.4 F (36.9 C) 98.4 F (36.9 C) 98.1 F (36.7 C) 98.3 F (36.8 C)  TempSrc: Oral Oral Axillary Oral  Resp: 16 17 18 19   Height:      SpO2: 99%  98% 99%   Weight change:   Intake/Output Summary (Last 24 hours) at 02/24/15 1707 Last data filed at 02/24/15 1322  Gross per 24 hour  Intake    970 ml  Output      0 ml  Net    970 ml   Physical Exam  Constitutional: She is oriented to person, place, and time. She appears well-developed and well-nourished. No distress.  Cardiovascular: Normal rate, regular rhythm and intact distal pulses.  Exam reveals no gallop and no friction rub.   No murmur heard. Pulmonary/Chest: Effort normal. No respiratory distress. She has no wheezes. She has no rales.  Abdominal: Soft. Bowel sounds are normal. She exhibits no distension. There is no tenderness.  Musculoskeletal: She exhibits no edema.  Neurological: She is alert and oriented to person, place, and time.  Skin: Skin is warm and dry.   Assessment/Plan: Principal Problem:   Symptomatic anemia  Symptomatic IDA Patient presented complaining of lightheadedness and dizziness. Hemoglobin 7.7 and hematocrit 25.0 upon admission yesterday. Normal MCV. Ferritin low, iron low, saturation low. TIBC high. Patient was given 1 unit of PRBCs yesterday and Hgb 8.0 this morning. Magnesium low (1.4) today. Patient's acute drop in hemoglobin could likely be due to occult GI bleed, she reports having last episode of melena 3 days ago. She does have a history of AV malformations and diverticulosis and is due for ferraheme transfuions on  10/6. GI has been consulted and is planning for enteroscopy tomorrow. Blood pressure stable at present.  -Given another 1 unit of PRBCs today -Given ferraheme transfusion today   -Magnesium repleted -Pantoprazole 40 mg IV every 12 hrs for now. We were informed by pharmacy that pantoprazole in high doses has an interaction with patient's hepatitis C drug Harvoni, as such, cannot take twice-daily dosing at home.  -Enteroscopy tomorrow   HTN -Continue atenolol 50 mg daily  Depression -Continue fluoxetine 40 mg daily -Continue trazodone 100 mg daily  Hepatitis C -Continue Ledipasvir-Sofosbuvir 90-400 mg daily  Hx of CVA, ACS, and PAD -continue Aspirin for secondary prevention because benefit outweighs risk  Diet: carb modified   Code: FULL  Dispo: Disposition is deferred at this time, awaiting improvement of current medical problems.  Anticipated discharge in approximately 1-2 day(s).   The patient does have a current PCP ([name], MD) and does need an [facility] hospital follow-up appointment after discharge.  The patient does not have transportation limitations that hinder transportation to clinic appointments.  .Services Needed at time of discharge: Y = Yes, Blank = No PT:   OT:   RN:   Equipment:   Other:       Shela Leff, MD 02/24/2015, 5:07 PM

## 2015-02-24 NOTE — Progress Notes (Signed)
Internal Medicine Clinic Attending  I saw and evaluated the patient.  I personally confirmed the key portions of the history and exam documented by Dr. Saraiya and I reviewed pertinent patient test results.  The assessment, diagnosis, and plan were formulated together and I agree with the documentation in the resident's note.  

## 2015-02-24 NOTE — Progress Notes (Signed)
Pt having serious mood swings. This AM pt was very pleasant and cooperative then became very anxious later in shift. RN, charge nurse, and nurse tech in to check on pt and console her. MD notified as well. Pt in no clinical distress, vital signs remained stable. Pt soon calmed down but is now frustrated,that staff is unable to remain at her bedside at all times and is being extremely unreasonable with staff. Pt incontinent of urine x1 and assisted onto bedside commode by PT and nursing staff. Bed was properly wiped down and cleaned and pt continues to say "I ain't laying in that pissy bed, I want a new one." Attempted to reassure pt but pt ignoring staff and refusing medications as well. Will cont to monitor pt. Call light left within reach.

## 2015-02-24 NOTE — Evaluation (Signed)
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  Patient admitted with symptomatic anemia Hgb 7.4 with presumed GI blood loss.  Clinical Impression  Patient presents with decreased mobility due to deficits listed in PT problem list.  She will benefit from skilled PT in the acute setting to allow return home with aide assist and HHPT.    Follow Up Recommendations Home health PT;Supervision - Intermittent    Equipment Recommendations  None recommended by PT    Recommendations for Other Services       Precautions / Restrictions Precautions Precautions: Fall Precaution Comments: left BKA      Mobility  Bed Mobility Overal bed mobility: Needs Assistance Bed Mobility: Sidelying to Sit   Sidelying to sit: Supervision       General bed mobility comments: due to weakness and anxiety due to soiled with urine in the bed  Transfers Overall transfer level: Needs assistance   Transfers: Sit to/from Stand;Stand Pivot Transfers Sit to Stand: Supervision Stand pivot transfers: Min guard       General transfer comment: assist to [facility] while getting bed changed and washing up, pivot bed to recliner again minguard assist for safety  Ambulation/Gait                Stairs            Wheelchair Mobility    Modified Rankin (Stroke Patients Only)       Balance Overall balance assessment: Needs assistance         Standing balance support: Bilateral upper extremity supported Standing balance-Leahy Scale: Poor Standing balance comment: stands with UE support without assist, left BKA and prosthesis not here                             Pertinent Vitals/Pain Pain Assessment: No/denies  pain    Home Living Family/patient expects to be discharged to:: Private residence Living Arrangements: Alone Available Help at Discharge: Personal care attendant;Family Type of Home: House Home Access: Level entry     Home Layout: One level Home Equipment: [place] - 2 wheels;Cane - single point;Wheelchair - manual;Tub bench Additional Comments: wc in the house and walks on prosthesis outside of house    Prior Function           Comments: Aide 5 hours a day 7 days a week for help with  ADL's and IADL's     Hand Dominance        Extremity/Trunk Assessment               Lower Extremity Assessment: Generalized weakness         Communication   Communication: No difficulties  Cognition Arousal/Alertness: Awake/alert Behavior During Therapy: Anxious (tearful) Overall Cognitive Status: Within Functional Limits for tasks assessed                      General Comments      Exercises        Assessment/Plan    PT Assessment Patient needs continued PT services  PT Diagnosis Generalized weakness   PT Problem List Decreased strength;Decreased activity tolerance;Decreased mobility;Decreased balance;Decreased safety awareness  PT Treatment Interventions Balance training;Gait  training;Functional mobility training;Patient/family education;Therapeutic activities;Therapeutic exercise   PT Goals (Current goals can be found in the Care Plan section) Acute Rehab PT Goals Patient Stated Goal: To go home, return to independent PT Goal Formulation: With patient Time For Goal Achievement: 03/10/15 Potential to Achieve Goals: Good    Frequency Min 3X/week   Barriers to discharge        Co-evaluation               End of Session   Activity Tolerance: Patient tolerated treatment well Patient left: in chair      Functional Assessment Tool Used: Clinical Judgement Functional Limitation: Mobility: Walking and moving around Mobility: Walking and Moving  Around Current Status (Z6606): At least 20 percent but less than 40 percent impaired, limited or restricted Mobility: Walking and Moving Around Goal Status [phone]): At least 1 percent but less than 20 percent impaired, limited or restricted    Time: 1455-1528 PT Time Calculation (min) (ACUTE ONLY): 33 min   Charges:   PT Evaluation $Initial PT Evaluation Tier I: 1 Procedure PT Treatments $Therapeutic Activity: 8-22 mins   PT G Codes:   PT G-Codes **NOT FOR INPATIENT CLASS** Functional Assessment Tool Used: Clinical Judgement Functional Limitation: Mobility: Walking and moving around Mobility: Walking and Moving Around Current Status (F0932): At least 20 percent but less than 40 percent impaired, limited or restricted Mobility: Walking and Moving Around Goal Status [phone]): At least 1 percent but less than 20 percent impaired, limited or restricted    [facility] 02/24/2015, 4:11 PM [place], [place] 02/24/2015

## 2015-02-24 NOTE — Progress Notes (Signed)
Initial Nutrition Assessment  DOCUMENTATION CODES:   Obesity unspecified  INTERVENTION:   -Boost Breeze po TID, each supplement provides 250 kcal and 9 grams of protein -Recommend re-weight pt to better assess weight trends  NUTRITION DIAGNOSIS:   Inadequate oral intake related to altered GI function as evidenced by  (clear liquid diet).  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 Patient refuses to speak and as such history obtained from [facility] clinic notes. Patient [facility] to [facility] yesterday complaining of lightheadedness and dizziness. CBC revealed a Hgb of 7.4 today and as such patient was asked to come into the hospital. Patient has been receiving ferraheme transfuions every 3-4 months at [place] center and her next appointment is on 03/02/15. Last PRBC transfusion was in 2012. She has a history of AV malformations and history of hernia repair done in march with post-op complications requiring wound vac. Clinic note also states patient reported having some episodes of melena last week.   Pt admitted with symptomatic IDA.   Attempted to examine pt x 2, however, pt was in RN and MD at times of visits.   Pt was just advanced to a clear liquid diet prior to RD visit. Noted orders for Ensure Enlive 4 times daily; will adjust supplement regimen to Boost Breeze due to clear liquid diet.   Reviewed previous RD note from 05/2014. Pt UBW is 170#; pt was trying to lose weight during previous admission. Noted pt has AKA. Last recorded wt was 167# on 01/28/15; will request re-weigh to better assess weight trends.    GI consultation pending (potential for endoscopy and colonoscopy). Pt receiving blood and ferriheme transfusions.   Labs reviewed: Mg: 1.4.  Diet Order:  Diet clear liquid Room service appropriate?: Yes; Fluid consistency:: Thin  Skin:  Reviewed, no issues  Last BM:  02/22/15  Height:   Ht Readings from Last 1 Encounters:  02/23/15 5\' 3"  (1.6 m)    Weight:   Wt Readings from Last 1 Encounters:  01/28/15 167 lb 11.2 oz (76.068 kg)    Ideal Body Weight:  48.1 kg  BMI:  Estimated body mass index is 29.71 kg/(m^2) as calculated from the following:   Height as of this encounter: 5\' 3"  (1.6 m).   Weight as of 01/28/15: 167 lb 11.2 oz (76.068 kg).  Adjusted BMI: 32.2  Estimated Nutritional Needs:   Kcal:  1500-1700  Protein:  65-80 grams  Fluid:  1.5-1.7 L  EDUCATION NEEDS:   No education needs identified at this time  Jenifer A. Jimmye Norman, RD, LDN, CDE Pager: [phone] After hours Pager: [phone]

## 2015-02-24 NOTE — Progress Notes (Signed)
Pt sitting up in chair at this time and tolerating well. Cooperative with staff at this time. No complaints offered. Call light left within reach. Chair alarm on.

## 2015-02-24 NOTE — Consult Note (Signed)
[facility] Consult: 3:05 PM 02/24/2015     Referring Provider: Dr. Evette Doffing of teaching service  Primary Care Physician:  [name], MD Primary Gastroenterologist:  Dr. Benson Norway. Patient dismissed from the practice.  Hematologist: Truitt Merle.      Reason for Consultation:  Anemia, recurrent, chronic   HPI: [name] is a 64 y.o. female.  Hx HTN, NIDDM.  S/P left AKA.  Hx HCV on Harvoni through the end of October 2016 under the supervision of Dr. Linus Salmons.  Liver biopsy in 2007 confirmed grade 1, stage I chronic hepatitis Not known to have cirrhosis. On abdominal ultrasound with the last progress. Of 12/30/2014 liver parenchyma was normal. Metavir fibrosis score with some F3 and F4 with high risk of fibrosis. S/P open incisional hernia repair with mesh in 07/2014, she required subsequent wound VAC and the incision has been slow to heal. For this she is follows with Dr. Ninfa Linden. Chronic iron deficiency anemia is treated with Feraheme transfusions at the cancer center every 3-4 months.  Last infusion was on 12/23/14 and she has an upcoming appointment at the cancer center for 03/02/2015. She was last transfused with blood, 2 PRBCs, in January 2 016.  She was also transfused in 03/2014.  Long-standing history of anemia and investigations of GI bleeding. Innumerable EGDs. Previous colonoscopy, flexible sigmoidoscopy and at least a couple of capsule endoscopies. EGD in 8/201 to perform because of active bleeding on capsule Endo. Dr. Benson Norway saw to AVMs 79 D2 was bleeding, the other at D3 was not bleeding. Both of these were treated with endoclips. On subsequent EGDs in 2012 and 2013 studies were either normal or small hiatal hernia noted.  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 A flexible sigmoidoscopy in July 2013 due to painless hematochezia noted blood and diverticulosis in the left colon.  a sigmoid polyp was removed at colonoscopy in 2011.  In 01/2012 she underwent partial left colectomy because of recurrent lower GI bleeding.  Patient is taking narcotics for chronic abdominal pain from the previous complicated hernia repair. This occasionally bleeds but not large volumes. Patient reported black, reddish soft stool on Monday and Tuesday but subsequent stools Wednesday and today are greenish.  This is pretty much the same picture is when she has had bleeding in the past. She has chronic abdominal pain at the site of her complicated ventral hernia repair earlier this year Hemoglobin measured 7.42 days ago. Her baseline hemoglobin is variable but it was 11.7 in 11/2014. MCV has gone from 91 then 283 2 days ago.  PT/INR are normal.  Renal function is normal. Iron studies show a drop of ferritin from 40 in July to 7 now. Iron also has dropped from 70 down to 14 during the same interval.  Family history of cirrhosis of liver and her mother who may also have had hepatitis C.    Past Medical History  Diagnosis Date  . Hypertension   . Colon polyps   .  Stomach problems   . Hypokalemic alkalosis   . Allergic rhinitis   . GERD (gastroesophageal reflux disease)   . Stroke 2010    no residual problems  . Anemia, iron deficiency 05/03/2011    recieves iron infusions  . Anemia   . GI bleeding   . Diverticulitis   . Transfusion history   . Difficulty sleeping     takes trazadone for sleep  . Peripheral vascular disease   . Hepatitis     hep C  . Anxiety   . Shortness of breath     due to anemia  . Diabetes mellitus without complication     Past Surgical History  Procedure Laterality Date  . Leg amputation above knee    . Esophagogastroduodenoscopy  05/05/2011    Procedure: ESOPHAGOGASTRODUODENOSCOPY (EGD);  Surgeon: Zenovia Jarred, MD;   Location: [facility];  Service: Gastroenterology;  Laterality: N/A;  Dr. Hilarie Fredrickson will do procedure for Dr. Benson Norway Saturday.  . Esophagogastroduodenoscopy  05/08/2011    Procedure: ESOPHAGOGASTRODUODENOSCOPY (EGD);  Surgeon: Beryle Beams;  Location: [facility];  Service: Endoscopy;  Laterality: N/A;  . Esophagogastroduodenoscopy  06/07/2011    Procedure: ESOPHAGOGASTRODUODENOSCOPY (EGD);  Surgeon: Beryle Beams, MD;  Location: [facility];  Service: Endoscopy;  Laterality: N/A;  . Hot hemostasis  06/07/2011    Procedure: HOT HEMOSTASIS (ARGON PLASMA COAGULATION/BICAP);  Surgeon: Beryle Beams, MD;  Location: [facility];  Service: Endoscopy;  Laterality: N/A;  . Esophagogastroduodenoscopy  12/20/2011    Procedure: ESOPHAGOGASTRODUODENOSCOPY (EGD);  Surgeon: Beryle Beams, MD;  Location: [facility];  Service: Endoscopy;  Laterality: N/A;  . Flexible sigmoidoscopy  12/21/2011    Procedure: FLEXIBLE SIGMOIDOSCOPY;  Surgeon: Beryle Beams, MD;  Location: [facility];  Service: Endoscopy;  Laterality: N/A;  . Carpal tunnel release      rt hand  . Partial colectomy  02/15/2012    Procedure: PARTIAL COLECTOMY;  Surgeon: Harl Bowie, MD;  Location: [facility];  Service: General;  Laterality: N/A;  . Laparotomy  02/16/2012    Procedure: EXPLORATORY LAPAROTOMY;  Surgeon: Edward Jolly, MD;  Location: [facility];  Service: General;  Laterality: N/A;  oversewing of anastomotic leak and rigid sigmoidoscopy  . Enteroscopy N/A 12/04/2012    Procedure: ENTEROSCOPY;  Surgeon: Beryle Beams, MD;  Location: [facility];  Service: Endoscopy;  Laterality: N/A;  . Enteroscopy N/A 12/18/2012    Procedure: ENTEROSCOPY;  Surgeon: Beryle Beams, MD;  Location: [facility];  Service: Endoscopy;  Laterality: N/A;  . Eye surgery Bilateral     cataracts  . Tonsillectomy    . Abdominal hysterectomy    . Insertion of mesh N/A 06/01/2014    Procedure: INSERTION OF MESH;  Surgeon: Coralie Keens, MD;  Location:  [facility];  Service: General;  Laterality: N/A;  . Incisional hernia repair N/A 06/01/2014    Procedure: ATTEMPTED LAPAROSCOPIC AND OPEN INCISIONAL HERNIA REPAIR WITH MESH;  Surgeon: Coralie Keens, MD;  Location: [place];  Service: General;  Laterality: N/A;  . Laparotomy N/A 06/21/2014    Procedure: ABDOMINAL WOUND EXPLORATION;  Surgeon: Coralie Keens, MD;  Location: [place];  Service: General;  Laterality: N/A;  . Application of wound vac N/A 06/21/2014    Procedure: APPLICATION OF WOUND VAC;  Surgeon: Coralie Keens, MD;  Location: [facility];  Service: General;  Laterality: N/A;    Prior to Admission medications   Medication Sig Start Date End Date Taking? Authorizing Provider  amLODipine (NORVASC) 10 MG tablet Take  1 tablet (10 mg total) by mouth daily. 01/28/15  Yes Marjan Rabbani, MD  aspirin EC 81 MG tablet Take 81 mg by mouth 2 (two) times daily.    Yes Historical Provider, MD  atenolol (TENORMIN) 50 MG tablet TAKE 1 TABLET BY MOUTH EVERY MORNING 11/04/14  Yes Jerrye Noble, MD  cetirizine (ZYRTEC) 10 MG tablet Take 10 mg by mouth every morning.    Yes Historical Provider, MD  FLUoxetine (PROZAC) 40 MG capsule Take 1 capsule (40 mg total) by mouth daily. 10/08/14  Yes Jerrye Noble, MD  gabapentin (NEURONTIN) 400 MG capsule TAKE 2 CAPSULE BY MOUTH THREE TIMES A DAY 01/28/15  Yes Marjan Rabbani, MD  glucose blood (ACCU-CHEK SMARTVIEW) test strip Check blood sugar  one time a day 09/27/14  Yes Jerrye Noble, MD  hydrochlorothiazide (HYDRODIURIL) 25 MG tablet Take 1 tablet (25 mg total) by mouth every morning. 10/22/14  Yes Jerrye Noble, MD  lactose free nutrition (BOOST PLUS) LIQD Take 237 mLs by mouth 3 (three) times daily with meals. 10/16/13  Yes Jerrye Noble, MD  Ledipasvir-Sofosbuvir (HARVONI) 90-400 MG TABS Take 1 tablet by mouth daily. 12/29/14  Yes Thayer Headings, MD  lisinopril (PRINIVIL,ZESTRIL) 40 MG tablet Take 1 tablet (40 mg total) by mouth every morning. 10/22/14  Yes Jerrye Noble, MD  Nutritional  Supplements (ENSURE ACTIVE HIGH PROTEIN) LIQD Take 1 Bottle by mouth 4 (four) times daily -  with meals and at bedtime. 10/22/14  Yes Jerrye Noble, MD  omeprazole (PRILOSEC) 20 MG capsule Take 1 capsule (20 mg total) by mouth daily. 12/29/14  Yes Thayer Headings, MD  oxyCODONE-acetaminophen (PERCOCET) 10-325 MG tablet Take 1 tablet by mouth every 6 (six) hours as needed for pain. 02/22/15  Yes Burgess Estelle, MD  potassium chloride 20 MEQ/15ML (10%) SOLN TAKE 15ML BY MOUTH EVERY DAY Patient taking differently: 15 mEq. TAKE 15ML BY MOUTH EVERY DAY 10/22/14  Yes Jerrye Noble, MD  traZODone (DESYREL) 100 MG tablet Take 100 mg by mouth at bedtime.   Yes Historical Provider, MD  vitamin C (ASCORBIC ACID) 500 MG tablet Take 500 mg by mouth every morning.   Yes Historical Provider, MD  Vitamin D, Ergocalciferol, (DRISDOL) 50000 UNITS CAPS capsule Take 1 capsule (50,000 Units total) by mouth every 7 (seven) days. Patient taking differently: Take 50,000 Units by mouth every 7 (seven) days. Take on Thursday 12/02/14  Yes [name], MD  ACCU-CHEK FASTCLIX LANCETS MISC Check your blood sugar one time a day 04/08/14   Jerrye Noble, MD  Specialty Vitamins Products (MAGNESIUM, AMINO ACID CHELATE,) 133 MG tablet Take 1 tablet by mouth 2 (two) times daily.    Historical Provider, MD    Scheduled Meds: . sodium chloride   Intravenous Once  . aspirin EC  81 mg Oral Daily  . atenolol  50 mg Oral q morning - 10a  . cholecalciferol  1,000 Units Oral Daily  . feeding supplement  1 Container Oral TID BM  . FLUoxetine  40 mg Oral Daily  . gabapentin  400 mg Oral TID  . Ledipasvir-Sofosbuvir  1 tablet Oral Daily  . magnesium sulfate 1 - 4 g bolus IVPB  2 g Intravenous Once  . pantoprazole (PROTONIX) IV  40 mg Intravenous Q12H  . traZODone  100 mg Oral QHS   Infusions:   PRN Meds: oxyCODONE-acetaminophen **AND** oxyCODONE   Allergies as of 02/23/2015 - Review Complete 02/23/2015  Allergen Reaction Noted  .  Ciprofloxacin Hives and Swelling 12/25/2011  . Morphine and related Other (See Comments) 12/25/2011  . Penicillins Hives and Swelling   . Codeine Hives and Swelling   . Feraheme [ferumoxytol] Itching 11/17/2012    Family History  Problem Relation Age of Onset  . Cancer Father     unknown  . Hypertension Mother   . Diverticulitis Mother     Social History   Social History  . Marital Status: Widowed    Spouse Name: N/A  . Number of Children: N/A  . Years of Education: N/A   Occupational History  . Not on file.   Social History Main Topics  . Smoking status: Current Every Day Smoker -- 0.20 packs/day for 17 years    Types: Cigarettes  . Smokeless tobacco: Never Used     Comment: wants to quit.  Smoking less 1 pk per week.  1-2 cigs per day  . Alcohol Use: 0.0 oz/week    0 Standard drinks or equivalent per week     Comment: Occasionally  . Drug Use: No     Comment: Thanksgiving and Christmas  Holidays 2014 hasn't had any since  . Sexual Activity: Yes    Birth Control/ Protection: Post-menopausal   Other Topics Concern  . Not on file   Social History Narrative    REVIEW OF SYSTEMS: Constitutional:  In the last 2 months she has gained 10 pounds. Her weight tends to fluctuate 5-10 pounds up or down with some regularity. ENT:  No nose bleeds Pulm:  Periodic shortness of breath and dyspnea on exertion. No productive cough. CV:  No palpitations, no LE edema.  GU:  No hematuria, no frequency.  Positive for urinary incontinence GI:  No nausea. Appetite is variable. Chronic abdominal pain as per HPI Heme:  Per HPI.     Transfusions:  Per HPI Neuro:  No headaches, no peripheral tingling or numbness Derm:  Chronic itching which started after her amputation. She scratches herself to the point that she causes skin eruptions and some minor bleeding.  Endocrine:  No sweats or chills.  No polyuria or dysuria Immunization:  Immunized for hepatitis A and B 01/28/15. Flu shot  performed 2 days ago. Travel:  None beyond local counties in last few months.    PHYSICAL EXAM: Vital signs in last 24 hours: Filed Vitals:   02/24/15 1345  BP: 150/88  Pulse: 103  Temp: 98.1 F (36.7 C)  Resp: 18   Wt Readings from Last 3 Encounters:  01/28/15 167 lb 11.2 oz (76.068 kg)  12/29/14 170 lb (77.111 kg)  12/09/14 156 lb 6.4 oz (70.943 kg)    General: Emotionally labile, obese AAF. She looks chronically ill Head:  No facial asymmetry or swelling. No signs of head trauma.  Eyes:  No scleral icterus, no conjunctival pallor. Ears:  Not hard of hearing  Nose:  No congestion or discharge Mouth:  Clear, moist, no blood in the mouth. Neck:  No JVD, no TMG. Lungs:  Clear bilaterally. No labored breathing, no cough. Heart: RRR. No MRG. S1/S2 audible. Abdomen:  Obese. Intact midline scar that looks to be healing by secondary intention, there is denuded skin which is slightly bloody in the region of the incision..  GU:  Patient spent sheets are wet due to urinary incontinence.  Rectal: Deferred   Musc/Skeltl: Left AKA. No gross joint contractures or deformity Extremities:  No edema in the right lower extremity.  Neurologic:  Oriented 3. Alert. No tremor. Moves all  4 limbs, strength not tested. Skin:  On her upper back as well as her thighs she has punctate lesions in various states of healing Nodes:  No cervical adenopathy   Psych:  Anxious, emotionally labile  Intake/Output from previous day: 09/28 0701 - 09/29 0700 In: 335 [Blood:335] Out: 400 [Urine:400] Intake/Output this shift: Total I/O In: 635 [P.O.:300; Blood:335] Out: -   LAB RESULTS:  Recent Labs  02/23/15 1040 02/23/15 1810 02/24/15 0100 02/24/15 0436  WBC 5.2 4.9  --  8.4  HGB 7.7* 7.4* 8.2* 8.0*  HCT 25.0* 23.9* 26.3* 25.9*  PLT 360 295  --  286   BMET Lab Results  Component Value Date   NA 136 02/24/2015   NA 135 02/23/2015   NA 137 02/23/2015   K 3.5 02/24/2015   K 3.5 02/23/2015    K 3.7 02/23/2015   CL 102 02/24/2015   CL 100* 02/23/2015   CL 103 02/23/2015   CO2 25 02/24/2015   CO2 28 02/23/2015   CO2 27 02/23/2015   GLUCOSE 156* 02/24/2015   GLUCOSE 119* 02/23/2015   GLUCOSE 137* 02/23/2015   BUN 15 02/24/2015   BUN 10 02/23/2015   BUN 11 02/23/2015   CREATININE 0.93 02/24/2015   CREATININE 0.87 02/23/2015   CREATININE 0.80 02/23/2015   CALCIUM 8.4* 02/24/2015   CALCIUM 8.7* 02/23/2015   CALCIUM 8.8* 02/23/2015   LFT  Recent Labs  02/23/15 1501 02/23/15 1830  PROT 7.0 6.5  ALBUMIN 3.2* 3.3*  AST 27 29  ALT 25 24  ALKPHOS 80 76  BILITOT 0.3 0.3   PT/INR Lab Results  Component Value Date   INR 1.13 02/23/2015   INR 1.09 11/30/2014   INR 1.10 06/01/2014   Hepatitis Panel No results for input(s): HEPBSAG, HCVAB, HEPAIGM, HEPBIGM in the last 72 hours. C-Diff No components found for: CDIFF Lipase     Component Value Date/Time   LIPASE 107* 03/27/2012 1545    Drugs of Abuse     Component Value Date/Time   LABOPIA NONE DETECTED 03/05/2012 1643   LABOPIA NEG 06/20/2010 2242   COCAINSCRNUR NONE DETECTED 03/05/2012 1643   COCAINSCRNUR POS* 06/20/2010 2242   LABBENZ NONE DETECTED 03/05/2012 1643   LABBENZ NEG 06/20/2010 2242   AMPHETMU NONE DETECTED 03/05/2012 1643   AMPHETMU NEG 06/20/2010 2242   THCU NONE DETECTED 03/05/2012 1643   LABBARB NONE DETECTED 03/05/2012 1643     RADIOLOGY STUDIES: No results found.  ENDOSCOPIC STUDIES: This is a list of some of the endoscopic procedures found in the Epic records. It is by no means comprehensive. 11/2011 flexible sigmoidoscopy.  4 painless hematochezia. By Dr. Benson Norway There was some fresh clots of blood and mild diverticulosis in the left colon consistent with diverticular bleed. Small hemorrhoids also noted.  11/2011 EGD.  [facility]. [facility]  05/2011 EGD. For GI bleed and history duodenal AVMs.  Dr. Benson Norway Normal study  05/05/2011 and 12/11 2012 EGDs.  Dr. Benson Norway Both normal  studies  12/2010 EGD. 4 bleeding AVMs seen on capsule Endo. Dr. Benson Norway saw a nonbleeding AVM at T3. Bleeding AVM at D2. AVMs were endoclipped. Study reached the proximal jejunum.  02/23/2010. Colonoscopy. 2 cecum. Sessile sigmoid polyp removed.  No pathology   IMPRESSION:   *  Acute on chronic anemia. Extensive endoscopic work up over the last several years. Has had bleeding attributed to both duodenal AVMs and colonic diverticulosis.   Treatments include periodic blood transfusions, and she just received 2 PRBCs  within the last 24 hours. She is also treated periodically with Feraheme infusions,  *  Hepatitis C. No cirrhosis. Started on [place] last month by Dr. Linus Salmons. Patient says she will finish the treatment at the end of October.     PLAN:     *  Will arrange for enteroscopy tomorrow afternoon.   Azucena Freed  02/24/2015, 3:05 PM Pager: [phone]  GI Attending Note   Chart was reviewed and patient was examined. X-rays and lab were reviewed.    I agree with management and plans. Suspect chronic GI bleeding from AVMs.  Plan as above.  Sandy Salaam. Deatra Ina, M.D., [facility] Cell [phone]

## 2015-02-24 NOTE — Progress Notes (Signed)
Internal Medicine Attending:   I saw and examined the patient. I reviewed the resident's note and I agree with the resident's findings and plan as documented in the resident's note.  Patient is feeling a little better today, but still reports having some lightheadedness. It is improved after 1 unit of blood transfusion last night, but not resolved. Hemoglobin rose from 7.4-8.0. Ferritin very low at 7. Had one bowel movement overnight which was reportedly green. Last melanic bowel movement was on Monday. Patient has a history of telangiectasias per the chart, last sigmoidoscopy 2013 showed diverticulosis. I don't think she has active blood loss currently. We're going to transfuse 1 more unit because of persistent symptomatic anemia. Also ferriheme infusion today because of iron deficiency anemia and low reticulocyte count. Plan for GI consultation, she will need repeat endoscopy and colonoscopy which could be done either inpatient or potentially outpatient if she remains stable with no melena.

## 2015-02-24 NOTE — Progress Notes (Signed)
Spoke with Dr Marlowe Sax regarding NPO order, MD states to continue order at this time.

## 2015-02-25 ENCOUNTER — Encounter: Payer: Medicare Other | Admitting: Internal Medicine

## 2015-02-25 ENCOUNTER — Observation Stay (HOSPITAL_COMMUNITY): Payer: Medicare Other | Admitting: Certified Registered Nurse Anesthetist

## 2015-02-25 ENCOUNTER — Encounter (HOSPITAL_COMMUNITY)
Admission: AD | Disposition: A | Discharge status: Home Health | Payer: Self-pay | Source: Ambulatory Visit | Attending: Student in an Organized Health Care Education/Training Program

## 2015-02-25 ENCOUNTER — Encounter (HOSPITAL_COMMUNITY): Payer: Self-pay | Admitting: Certified Registered Nurse Anesthetist

## 2015-02-25 DIAGNOSIS — Z880 Allergy status to penicillin: Secondary | ICD-10-CM | POA: Diagnosis not present

## 2015-02-25 DIAGNOSIS — K922 Gastrointestinal hemorrhage, unspecified: Secondary | ICD-10-CM | POA: Diagnosis not present

## 2015-02-25 DIAGNOSIS — K31819 Angiodysplasia of stomach and duodenum without bleeding: Secondary | ICD-10-CM

## 2015-02-25 DIAGNOSIS — Z8673 Personal history of transient ischemic attack (TIA), and cerebral infarction without residual deficits: Secondary | ICD-10-CM | POA: Diagnosis not present

## 2015-02-25 DIAGNOSIS — D649 Anemia, unspecified: Secondary | ICD-10-CM | POA: Diagnosis not present

## 2015-02-25 DIAGNOSIS — Q2733 Arteriovenous malformation of digestive system vessel: Secondary | ICD-10-CM | POA: Diagnosis not present

## 2015-02-25 DIAGNOSIS — K5730 Diverticulosis of large intestine without perforation or abscess without bleeding: Secondary | ICD-10-CM | POA: Diagnosis not present

## 2015-02-25 DIAGNOSIS — B1920 Unspecified viral hepatitis C without hepatic coma: Secondary | ICD-10-CM | POA: Diagnosis not present

## 2015-02-25 DIAGNOSIS — D509 Iron deficiency anemia, unspecified: Secondary | ICD-10-CM | POA: Diagnosis not present

## 2015-02-25 DIAGNOSIS — I739 Peripheral vascular disease, unspecified: Secondary | ICD-10-CM | POA: Diagnosis not present

## 2015-02-25 HISTORY — PX: ENTEROSCOPY: SHX5533

## 2015-02-25 HISTORY — DX: Angiodysplasia of stomach and duodenum without bleeding: K31.819

## 2015-02-25 LAB — TYPE AND SCREEN
ABO/RH(D): AB POS
Antibody Screen: NEGATIVE
UNIT DIVISION: 0
Unit division: 0

## 2015-02-25 LAB — CBC
HCT: 28.4 % — ABNORMAL LOW (ref 36.0–46.0)
Hemoglobin: 8.8 g/dL — ABNORMAL LOW (ref 12.0–15.0)
MCH: 24.6 pg — ABNORMAL LOW (ref 26.0–34.0)
MCHC: 31 g/dL (ref 30.0–36.0)
MCV: 79.6 fL (ref 78.0–100.0)
PLATELETS: 238 10*3/uL (ref 150–400)
RBC: 3.57 MIL/uL — ABNORMAL LOW (ref 3.87–5.11)
RDW: 17.3 % — AB (ref 11.5–15.5)
WBC: 9.2 10*3/uL (ref 4.0–10.5)

## 2015-02-25 LAB — MAGNESIUM: MAGNESIUM: 1.6 mg/dL — AB (ref 1.7–2.4)

## 2015-02-25 SURGERY — ENTEROSCOPY
Anesthesia: Monitor Anesthesia Care

## 2015-02-25 MED ORDER — PROPOFOL 500 MG/50ML IV EMUL
INTRAVENOUS | Status: DC | PRN
  Administered 2015-02-25: 75 ug/kg/min via INTRAVENOUS

## 2015-02-25 MED ORDER — GABAPENTIN 400 MG PO CAPS
400.0000 mg | ORAL_CAPSULE | Freq: Once | ORAL | Status: AC
  Administered 2015-02-25: 400 mg via ORAL
  Filled 2015-02-25: qty 1

## 2015-02-25 MED ORDER — FENTANYL CITRATE (PF) 100 MCG/2ML IJ SOLN
INTRAMUSCULAR | Status: DC | PRN
  Administered 2015-02-25 (×2): 25 ug via INTRAVENOUS

## 2015-02-25 MED ORDER — BUTAMBEN-TETRACAINE-BENZOCAINE 2-2-14 % EX AERO
INHALATION_SPRAY | CUTANEOUS | Status: DC | PRN
  Administered 2015-02-25: 2 via TOPICAL

## 2015-02-25 MED ORDER — LIDOCAINE HCL (CARDIAC) 20 MG/ML IV SOLN
INTRAVENOUS | Status: DC | PRN
  Administered 2015-02-25: 80 mg via INTRAVENOUS

## 2015-02-25 MED ORDER — DICLOFENAC SODIUM 1 % TD GEL
2.0000 g | Freq: Four times a day (QID) | TRANSDERMAL | Status: DC | PRN
  Administered 2015-02-25: 2 g via TOPICAL
  Filled 2015-02-25: qty 100

## 2015-02-25 NOTE — H&P (View-Only) (Signed)
[facility] Consult: 3:05 PM 02/24/2015     Referring Provider: Dr. Evette Doffing of teaching service  Primary Care Physician:  [name], MD Primary Gastroenterologist:  Dr. Benson Norway. Patient dismissed from the practice.  Hematologist: Truitt Merle.      Reason for Consultation:  Anemia, recurrent, chronic   HPI: [name] is a 64 y.o. female.  Hx HTN, NIDDM.  S/P left AKA.  Hx HCV on Harvoni through the end of October 2016 under the supervision of Dr. Linus Salmons.  Liver biopsy in 2007 confirmed grade 1, stage I chronic hepatitis Not known to have cirrhosis. On abdominal ultrasound with the last progress. Of 12/30/2014 liver parenchyma was normal. Metavir fibrosis score with some F3 and F4 with high risk of fibrosis. S/P open incisional hernia repair with mesh in 07/2014, she required subsequent wound VAC and the incision has been slow to heal. For this she is follows with Dr. Ninfa Linden. Chronic iron deficiency anemia is treated with Feraheme transfusions at the cancer center every 3-4 months.  Last infusion was on 12/23/14 and she has an upcoming appointment at the cancer center for 03/02/2015. She was last transfused with blood, 2 PRBCs, in January 2 016.  She was also transfused in 03/2014.  Long-standing history of anemia and investigations of GI bleeding. Innumerable EGDs. Previous colonoscopy, flexible sigmoidoscopy and at least a couple of capsule endoscopies. EGD in 8/201 to perform because of active bleeding on capsule Endo. Dr. Benson Norway saw to AVMs 88 D2 was bleeding, the other at D3 was not bleeding. Both of these were treated with endoclips. On subsequent EGDs in 2012 and 2013 studies were either normal or small hiatal hernia noted.  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 A flexible sigmoidoscopy in July 2013 due to painless hematochezia noted blood and diverticulosis in the left colon.  a sigmoid polyp was removed at colonoscopy in 2011.  In 01/2012 she underwent partial left colectomy because of recurrent lower GI bleeding.  Patient is taking narcotics for chronic abdominal pain from the previous complicated hernia repair. This occasionally bleeds but not large volumes. Patient reported black, reddish soft stool on Monday and Tuesday but subsequent stools Wednesday and today are greenish.  This is pretty much the same picture is when she has had bleeding in the past. She has chronic abdominal pain at the site of her complicated ventral hernia repair earlier this year Hemoglobin measured 7.42 days ago. Her baseline hemoglobin is variable but it was 11.7 in 11/2014. MCV has gone from 91 then 283 2 days ago.  PT/INR are normal.  Renal function is normal. Iron studies show a drop of ferritin from 40 in July to 7 now. Iron also has dropped from 70 down to 14 during the same interval.  Family history of cirrhosis of liver and her mother who may also have had hepatitis C.    Past Medical History  Diagnosis Date  . Hypertension   . Colon polyps   .  Stomach problems   . Hypokalemic alkalosis   . Allergic rhinitis   . GERD (gastroesophageal reflux disease)   . Stroke 2010    no residual problems  . Anemia, iron deficiency 05/03/2011    recieves iron infusions  . Anemia   . GI bleeding   . Diverticulitis   . Transfusion history   . Difficulty sleeping     takes trazadone for sleep  . Peripheral vascular disease   . Hepatitis     hep C  . Anxiety   . Shortness of breath     due to anemia  . Diabetes mellitus without complication     Past Surgical History  Procedure Laterality Date  . Leg amputation above knee    . Esophagogastroduodenoscopy  05/05/2011    Procedure: ESOPHAGOGASTRODUODENOSCOPY (EGD);  Surgeon: Zenovia Jarred, MD;   Location: [facility];  Service: Gastroenterology;  Laterality: N/A;  Dr. Hilarie Fredrickson will do procedure for Dr. Benson Norway Saturday.  . Esophagogastroduodenoscopy  05/08/2011    Procedure: ESOPHAGOGASTRODUODENOSCOPY (EGD);  Surgeon: Beryle Beams;  Location: [facility];  Service: Endoscopy;  Laterality: N/A;  . Esophagogastroduodenoscopy  06/07/2011    Procedure: ESOPHAGOGASTRODUODENOSCOPY (EGD);  Surgeon: Beryle Beams, MD;  Location: [facility];  Service: Endoscopy;  Laterality: N/A;  . Hot hemostasis  06/07/2011    Procedure: HOT HEMOSTASIS (ARGON PLASMA COAGULATION/BICAP);  Surgeon: Beryle Beams, MD;  Location: [facility];  Service: Endoscopy;  Laterality: N/A;  . Esophagogastroduodenoscopy  12/20/2011    Procedure: ESOPHAGOGASTRODUODENOSCOPY (EGD);  Surgeon: Beryle Beams, MD;  Location: [facility];  Service: Endoscopy;  Laterality: N/A;  . Flexible sigmoidoscopy  12/21/2011    Procedure: FLEXIBLE SIGMOIDOSCOPY;  Surgeon: Beryle Beams, MD;  Location: [facility];  Service: Endoscopy;  Laterality: N/A;  . Carpal tunnel release      rt hand  . Partial colectomy  02/15/2012    Procedure: PARTIAL COLECTOMY;  Surgeon: Harl Bowie, MD;  Location: [facility];  Service: General;  Laterality: N/A;  . Laparotomy  02/16/2012    Procedure: EXPLORATORY LAPAROTOMY;  Surgeon: Edward Jolly, MD;  Location: [facility];  Service: General;  Laterality: N/A;  oversewing of anastomotic leak and rigid sigmoidoscopy  . Enteroscopy N/A 12/04/2012    Procedure: ENTEROSCOPY;  Surgeon: Beryle Beams, MD;  Location: [facility];  Service: Endoscopy;  Laterality: N/A;  . Enteroscopy N/A 12/18/2012    Procedure: ENTEROSCOPY;  Surgeon: Beryle Beams, MD;  Location: [facility];  Service: Endoscopy;  Laterality: N/A;  . Eye surgery Bilateral     cataracts  . Tonsillectomy    . Abdominal hysterectomy    . Insertion of mesh N/A 06/01/2014    Procedure: INSERTION OF MESH;  Surgeon: Coralie Keens, MD;  Location:  [facility];  Service: General;  Laterality: N/A;  . Incisional hernia repair N/A 06/01/2014    Procedure: ATTEMPTED LAPAROSCOPIC AND OPEN INCISIONAL HERNIA REPAIR WITH MESH;  Surgeon: Coralie Keens, MD;  Location: [facility];  Service: General;  Laterality: N/A;  . Laparotomy N/A 06/21/2014    Procedure: ABDOMINAL WOUND EXPLORATION;  Surgeon: Coralie Keens, MD;  Location: [facility];  Service: General;  Laterality: N/A;  . Application of wound vac N/A 06/21/2014    Procedure: APPLICATION OF WOUND VAC;  Surgeon: Coralie Keens, MD;  Location: [facility];  Service: General;  Laterality: N/A;    Prior to Admission medications   Medication Sig Start Date End Date Taking? Authorizing Provider  amLODipine (NORVASC) 10 MG tablet Take  1 tablet (10 mg total) by mouth daily. 01/28/15  Yes Marjan Rabbani, MD  aspirin EC 81 MG tablet Take 81 mg by mouth 2 (two) times daily.    Yes Historical Provider, MD  atenolol (TENORMIN) 50 MG tablet TAKE 1 TABLET BY MOUTH EVERY MORNING 11/04/14  Yes Jerrye Noble, MD  cetirizine (ZYRTEC) 10 MG tablet Take 10 mg by mouth every morning.    Yes Historical Provider, MD  FLUoxetine (PROZAC) 40 MG capsule Take 1 capsule (40 mg total) by mouth daily. 10/08/14  Yes Jerrye Noble, MD  gabapentin (NEURONTIN) 400 MG capsule TAKE 2 CAPSULE BY MOUTH THREE TIMES A DAY 01/28/15  Yes Marjan Rabbani, MD  glucose blood (ACCU-CHEK SMARTVIEW) test strip Check blood sugar  one time a day 09/27/14  Yes Jerrye Noble, MD  hydrochlorothiazide (HYDRODIURIL) 25 MG tablet Take 1 tablet (25 mg total) by mouth every morning. 10/22/14  Yes Jerrye Noble, MD  lactose free nutrition (BOOST PLUS) LIQD Take 237 mLs by mouth 3 (three) times daily with meals. 10/16/13  Yes Jerrye Noble, MD  Ledipasvir-Sofosbuvir (HARVONI) 90-400 MG TABS Take 1 tablet by mouth daily. 12/29/14  Yes Thayer Headings, MD  lisinopril (PRINIVIL,ZESTRIL) 40 MG tablet Take 1 tablet (40 mg total) by mouth every morning. 10/22/14  Yes Jerrye Noble, MD  Nutritional  Supplements (ENSURE ACTIVE HIGH PROTEIN) LIQD Take 1 Bottle by mouth 4 (four) times daily -  with meals and at bedtime. 10/22/14  Yes Jerrye Noble, MD  omeprazole (PRILOSEC) 20 MG capsule Take 1 capsule (20 mg total) by mouth daily. 12/29/14  Yes Thayer Headings, MD  oxyCODONE-acetaminophen (PERCOCET) 10-325 MG tablet Take 1 tablet by mouth every 6 (six) hours as needed for pain. 02/22/15  Yes Burgess Estelle, MD  potassium chloride 20 MEQ/15ML (10%) SOLN TAKE 15ML BY MOUTH EVERY DAY Patient taking differently: 15 mEq. TAKE 15ML BY MOUTH EVERY DAY 10/22/14  Yes Jerrye Noble, MD  traZODone (DESYREL) 100 MG tablet Take 100 mg by mouth at bedtime.   Yes Historical Provider, MD  vitamin C (ASCORBIC ACID) 500 MG tablet Take 500 mg by mouth every morning.   Yes Historical Provider, MD  Vitamin D, Ergocalciferol, (DRISDOL) 50000 UNITS CAPS capsule Take 1 capsule (50,000 Units total) by mouth every 7 (seven) days. Patient taking differently: Take 50,000 Units by mouth every 7 (seven) days. Take on Thursday 12/02/14  Yes [name], MD  ACCU-CHEK FASTCLIX LANCETS MISC Check your blood sugar one time a day 04/08/14   Jerrye Noble, MD  Specialty Vitamins Products (MAGNESIUM, AMINO ACID CHELATE,) 133 MG tablet Take 1 tablet by mouth 2 (two) times daily.    Historical Provider, MD    Scheduled Meds: . sodium chloride   Intravenous Once  . aspirin EC  81 mg Oral Daily  . atenolol  50 mg Oral q morning - 10a  . cholecalciferol  1,000 Units Oral Daily  . feeding supplement  1 Container Oral TID BM  . FLUoxetine  40 mg Oral Daily  . gabapentin  400 mg Oral TID  . Ledipasvir-Sofosbuvir  1 tablet Oral Daily  . magnesium sulfate 1 - 4 g bolus IVPB  2 g Intravenous Once  . pantoprazole (PROTONIX) IV  40 mg Intravenous Q12H  . traZODone  100 mg Oral QHS   Infusions:   PRN Meds: oxyCODONE-acetaminophen **AND** oxyCODONE   Allergies as of 02/23/2015 - Review Complete 02/23/2015  Allergen Reaction Noted  .  Ciprofloxacin Hives and Swelling 12/25/2011  . Morphine and related Other (See Comments) 12/25/2011  . Penicillins Hives and Swelling   . Codeine Hives and Swelling   . Feraheme [ferumoxytol] Itching 11/17/2012    Family History  Problem Relation Age of Onset  . Cancer Father     unknown  . Hypertension Mother   . Diverticulitis Mother     Social History   Social History  . Marital Status: Widowed    Spouse Name: N/A  . Number of Children: N/A  . Years of Education: N/A   Occupational History  . Not on file.   Social History Main Topics  . Smoking status: Current Every Day Smoker -- 0.20 packs/day for 17 years    Types: Cigarettes  . Smokeless tobacco: Never Used     Comment: wants to quit.  Smoking less 1 pk per week.  1-2 cigs per day  . Alcohol Use: 0.0 oz/week    0 Standard drinks or equivalent per week     Comment: Occasionally  . Drug Use: No     Comment: Thanksgiving and Christmas  Holidays 2014 hasn't had any since  . Sexual Activity: Yes    Birth Control/ Protection: Post-menopausal   Other Topics Concern  . Not on file   Social History Narrative    REVIEW OF SYSTEMS: Constitutional:  In the last 2 months she has gained 10 pounds. Her weight tends to fluctuate 5-10 pounds up or down with some regularity. ENT:  No nose bleeds Pulm:  Periodic shortness of breath and dyspnea on exertion. No productive cough. CV:  No palpitations, no LE edema.  GU:  No hematuria, no frequency.  Positive for urinary incontinence GI:  No nausea. Appetite is variable. Chronic abdominal pain as per HPI Heme:  Per HPI.     Transfusions:  Per HPI Neuro:  No headaches, no peripheral tingling or numbness Derm:  Chronic itching which started after her amputation. She scratches herself to the point that she causes skin eruptions and some minor bleeding.  Endocrine:  No sweats or chills.  No polyuria or dysuria Immunization:  Immunized for hepatitis A and B 01/28/15. Flu shot  performed 2 days ago. Travel:  None beyond local counties in last few months.    PHYSICAL EXAM: Vital signs in last 24 hours: Filed Vitals:   02/24/15 1345  BP: 150/88  Pulse: 103  Temp: 98.1 F (36.7 C)  Resp: 18   Wt Readings from Last 3 Encounters:  01/28/15 167 lb 11.2 oz (76.068 kg)  12/29/14 170 lb (77.111 kg)  12/09/14 156 lb 6.4 oz (70.943 kg)    General: Emotionally labile, obese AAF. She looks chronically ill Head:  No facial asymmetry or swelling. No signs of head trauma.  Eyes:  No scleral icterus, no conjunctival pallor. Ears:  Not hard of hearing  Nose:  No congestion or discharge Mouth:  Clear, moist, no blood in the mouth. Neck:  No JVD, no TMG. Lungs:  Clear bilaterally. No labored breathing, no cough. Heart: RRR. No MRG. S1/S2 audible. Abdomen:  Obese. Intact midline scar that looks to be healing by secondary intention, there is denuded skin which is slightly bloody in the region of the incision..  GU:  Patient spent sheets are wet due to urinary incontinence.  Rectal: Deferred   Musc/Skeltl: Left AKA. No gross joint contractures or deformity Extremities:  No edema in the right lower extremity.  Neurologic:  Oriented 3. Alert. No tremor. Moves all  4 limbs, strength not tested. Skin:  On her upper back as well as her thighs she has punctate lesions in various states of healing Nodes:  No cervical adenopathy   Psych:  Anxious, emotionally labile  Intake/Output from previous day: 09/28 0701 - 09/29 0700 In: 335 [Blood:335] Out: 400 [Urine:400] Intake/Output this shift: Total I/O In: 635 [P.O.:300; Blood:335] Out: -   LAB RESULTS:  Recent Labs  02/23/15 1040 02/23/15 1810 02/24/15 0100 02/24/15 0436  WBC 5.2 4.9  --  8.4  HGB 7.7* 7.4* 8.2* 8.0*  HCT 25.0* 23.9* 26.3* 25.9*  PLT 360 295  --  286   BMET Lab Results  Component Value Date   NA 136 02/24/2015   NA 135 02/23/2015   NA 137 02/23/2015   K 3.5 02/24/2015   K 3.5 02/23/2015    K 3.7 02/23/2015   CL 102 02/24/2015   CL 100* 02/23/2015   CL 103 02/23/2015   CO2 25 02/24/2015   CO2 28 02/23/2015   CO2 27 02/23/2015   GLUCOSE 156* 02/24/2015   GLUCOSE 119* 02/23/2015   GLUCOSE 137* 02/23/2015   BUN 15 02/24/2015   BUN 10 02/23/2015   BUN 11 02/23/2015   CREATININE 0.93 02/24/2015   CREATININE 0.87 02/23/2015   CREATININE 0.80 02/23/2015   CALCIUM 8.4* 02/24/2015   CALCIUM 8.7* 02/23/2015   CALCIUM 8.8* 02/23/2015   LFT  Recent Labs  02/23/15 1501 02/23/15 1830  PROT 7.0 6.5  ALBUMIN 3.2* 3.3*  AST 27 29  ALT 25 24  ALKPHOS 80 76  BILITOT 0.3 0.3   PT/INR Lab Results  Component Value Date   INR 1.13 02/23/2015   INR 1.09 11/30/2014   INR 1.10 06/01/2014   Hepatitis Panel No results for input(s): HEPBSAG, HCVAB, HEPAIGM, HEPBIGM in the last 72 hours. C-Diff No components found for: CDIFF Lipase     Component Value Date/Time   LIPASE 107* 03/27/2012 1545    Drugs of Abuse     Component Value Date/Time   LABOPIA NONE DETECTED 03/05/2012 1643   LABOPIA NEG 06/20/2010 2242   COCAINSCRNUR NONE DETECTED 03/05/2012 1643   COCAINSCRNUR POS* 06/20/2010 2242   LABBENZ NONE DETECTED 03/05/2012 1643   LABBENZ NEG 06/20/2010 2242   AMPHETMU NONE DETECTED 03/05/2012 1643   AMPHETMU NEG 06/20/2010 2242   THCU NONE DETECTED 03/05/2012 1643   LABBARB NONE DETECTED 03/05/2012 1643     RADIOLOGY STUDIES: No results found.  ENDOSCOPIC STUDIES: This is a list of some of the endoscopic procedures found in the Epic records. It is by no means comprehensive. 11/2011 flexible sigmoidoscopy.  4 painless hematochezia. By Dr. Benson Norway There was some fresh clots of blood and mild diverticulosis in the left colon consistent with diverticular bleed. Small hemorrhoids also noted.  11/2011 EGD.  [facility]  05/2011 EGD. For GI bleed and history duodenal AVMs.  Dr. Benson Norway Normal study  05/05/2011 and 12/11 2012 EGDs.  Dr. Benson Norway Both normal  studies  12/2010 EGD. 4 bleeding AVMs seen on capsule Endo. Dr. Benson Norway saw a nonbleeding AVM at T3. Bleeding AVM at D2. AVMs were endoclipped. Study reached the proximal jejunum.  02/23/2010. Colonoscopy. 2 cecum. Sessile sigmoid polyp removed.  No pathology   IMPRESSION:   *  Acute on chronic anemia. Extensive endoscopic work up over the last several years. Has had bleeding attributed to both duodenal AVMs and colonic diverticulosis.   Treatments include periodic blood transfusions, and she just received 2 PRBCs  within the last 24 hours. She is also treated periodically with Feraheme infusions,  *  Hepatitis C. No cirrhosis. Started on [place] last month by Dr. Linus Salmons. Patient says she will finish the treatment at the end of October.     PLAN:     *  Will arrange for enteroscopy tomorrow afternoon.   Azucena Freed  02/24/2015, 3:05 PM Pager: [phone]  GI Attending Note   Chart was reviewed and patient was examined. X-rays and lab were reviewed.    I agree with management and plans. Suspect chronic GI bleeding from AVMs.  Plan as above.  Sandy Salaam. Deatra Ina, M.D., [facility] Cell [phone]

## 2015-02-25 NOTE — Anesthesia Postprocedure Evaluation (Signed)
Anesthesia Post Note  Patient: [name]  Procedure(s) Performed: Procedure(s) (LRB): ENTEROSCOPY (N/A)  Anesthesia type: MAC  Patient location: PACU  Post pain: Pain level controlled  Post assessment: Post-op Vital signs reviewed  Last Vitals: BP 170/87 mmHg  Pulse 67  Temp(Src) 36.7 C (Oral)  Resp 18  Ht 5\' 3"  (1.6 m)  Wt 133 lb 13.1 oz (60.7 kg)  BMI 23.71 kg/m2  SpO2 99%  Post vital signs: Reviewed  Level of consciousness: awake  Complications: No apparent anesthesia complications

## 2015-02-25 NOTE — Anesthesia Preprocedure Evaluation (Signed)
Anesthesia Evaluation  Patient identified by MRN, date of birth, ID band Patient awake    Reviewed: Allergy & Precautions, NPO status , Patient's Chart, lab work & pertinent test results  Airway Mallampati: II  TM Distance: >3 FB Neck ROM: Full    Dental no notable dental hx. (+) Edentulous Upper, Edentulous Lower   Pulmonary Current Smoker,    Pulmonary exam normal breath sounds clear to auscultation       Cardiovascular hypertension, Pt. on medications and Pt. on home beta blockers + Peripheral Vascular Disease  Normal cardiovascular exam     Neuro/Psych CVA, No Residual Symptoms    GI/Hepatic GERD  ,(+) Hepatitis -, COpen abdominal wound   Endo/Other  diabetes, Type 2, Oral Hypoglycemic AgentsMorbid obesity  Renal/GU negative Renal ROS  negative genitourinary   Musculoskeletal negative musculoskeletal ROS (+)   Abdominal   Peds negative pediatric ROS (+)  Hematology  (+) anemia ,   Anesthesia Other Findings   Reproductive/Obstetrics negative OB ROS                             Anesthesia Physical  Anesthesia Plan  ASA: III  Anesthesia Plan: MAC   Post-op Pain Management:    Induction: Intravenous  Airway Management Planned:   Additional Equipment:   Intra-op Plan:   Post-operative Plan:   Informed Consent: I have reviewed the patients History and Physical, chart, labs and discussed the procedure including the risks, benefits and alternatives for the proposed anesthesia with the patient or authorized representative who has indicated his/her understanding and acceptance.   Dental advisory given  Plan Discussed with: CRNA and Surgeon  Anesthesia Plan Comments:         Anesthesia Quick Evaluation

## 2015-02-25 NOTE — Progress Notes (Signed)
Physical Therapy Treatment & Discharge Patient Details Name: [name] MRN: [id number] DOB: 1951-05-22 Today's Date: 02/25/2015    History of Present Illness Patient is a 64 yo F with a PMHx of HTN, NIDDM, IDA due to AVM+diverticulosis, HCV on Harvoni, Depression, GERD, vitamin D deficiency on weekly dose, and h/o hypokalemia.  Patient admitted with symptomatic anemia Hgb 7.4 with presumed GI blood loss.    PT Comments    Patient progressed today to achieve all acute PT goals.  Had prosthesis already donned and mobilizing well in room with walker.  Encouraged walks in hall with nursing staff till d/c home, but no further acute PT needs at this time.  Will sign off.  Follow Up Recommendations  No PT follow up     Equipment Recommendations  None recommended by PT    Recommendations for Other Services       Precautions / Restrictions Precautions Precautions: Fall Precaution Comments: left AKA Restrictions Weight Bearing Restrictions: No    Mobility  Bed Mobility Overal bed mobility: Modified Independent Bed Mobility: Supine to Sit     Supine to sit: Modified independent (Device/Increase time)        Transfers Overall transfer level: Modified independent Equipment used: Rolling walker (2 wheeled) (L prosthesis) Transfers: Sit to/from [facility] Sit to Stand: Modified independent (Device/Increase time) Stand pivot transfers: Modified independent (Device/Increase time)       General transfer comment: able to get to [facility] no assist and stood from bed and BSC no assist already had L prosthesis on from OT session  Ambulation/Gait Ambulation/Gait assistance: Modified independent (Device/Increase time) Ambulation Distance (Feet): 30 Feet Assistive device: Rolling walker (2 wheeled) Gait Pattern/deviations: Step-through pattern;Decreased stride length     General Gait Details: patient ambulating in room RW no difficulties and washed hands after  toileting   Stairs            Wheelchair Mobility    Modified Rankin (Stroke Patients Only)       Balance     Sitting balance-Leahy Scale: Normal       Standing balance-Leahy Scale: Fair Standing balance comment: no UE support washing hands at sink                    Cognition Arousal/Alertness: Awake/alert Behavior During Therapy: WFL for tasks assessed/performed Overall Cognitive Status: Within Functional Limits for tasks assessed                      Exercises      General Comments        Pertinent Vitals/Pain Pain Assessment: No/denies pain    Home Living Family/patient expects to be discharged to:: Private residence Living Arrangements: Alone Available Help at Discharge: Personal care attendant;Family Type of Home: House Home Access: Level entry   Home Layout: One level Home Equipment: [place] - 2 wheels;Cane - single point;Wheelchair - manual;Tub bench;Bedside commode Additional Comments: wc in the house and walks on prosthesis outside of house    Prior Function        Comments: Aide 5 hours a day 7 days a week for help with  ADL's and IADL's   PT Goals (current goals can now be found in the care plan section) Progress towards PT goals: Goals met/education completed, patient discharged from PT    Frequency  Min 3X/week    PT Plan Discharge plan needs to be updated    Co-evaluation  End of Session   Activity Tolerance: Patient tolerated treatment well Patient left: in chair;with call bell/phone within reach     Time: 1606-1630 PT Time Calculation (min) (ACUTE ONLY): 24 min  Charges:  $Gait Training: 8-22 mins $Therapeutic Activity: 8-22 mins                    G Codes:      WYNN,CYNDI February 26, 2015, 5:03 PM  Magda Kiel, [place] February 26, 2015

## 2015-02-25 NOTE — Evaluation (Signed)
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  Patient admitted with symptomatic anemia Hgb 7.4 with presumed GI blood loss.   Clinical Impression   Pt is currently at modified independent level for toilet transfers with use of the RW and BSC.  She is also modified independent for basic selfcare tasks sit to stand.  No further OT needs at this time.  Recommend use of the RW for support with standing as well as LLE prosthesis.     Follow Up Recommendations  No OT follow up          Precautions / Restrictions Precautions Precautions: None Precaution Comments: left BKA Restrictions Weight Bearing Restrictions: No      Mobility Bed Mobility Overal bed mobility: Modified Independent Bed Mobility: Supine to Sit     Supine to sit: Modified independent (Device/Increase time)        Transfers Overall transfer level: Modified independent Equipment used: Rolling walker (2 wheeled);None (LLE prosthesis) Transfers: Sit to/from Stand Sit to Stand: Modified independent (Device/Increase time) Stand pivot transfers: Modified independent (Device/Increase time)            Balance     Sitting balance-Leahy Scale: Normal       Standing balance-Leahy Scale: Fair Standing balance comment: Pt relies on UE support with mobility and for grooming tasks in standing.                             ADL Overall ADL's : Needs assistance/impaired Eating/Feeding: Independent;Sitting   Grooming: Wash/dry hands;Standing;Modified independent   Upper Body Bathing: Modified independent;Sitting   Lower Body Bathing: Modified independent;Sit to/from stand   Upper Body Dressing : Modified  independent;Sitting   Lower Body Dressing: Modified independent;Sit to/from stand   Toilet Transfer: Modified Independent;RW;BSC   Toileting- Clothing Manipulation and Hygiene: Modified independent;Sit to/from stand       Functional mobility during ADLs: Modified independent;Rolling walker General ADL Comments: Pt is overall modified independent for basic selfcare tasks and mobility using the RW and prosthesis.  At home pt relies mostly on the wheelchair inside for mobility and prosthesis and RW for outside.  No further OT needs, feel pt is at normal baseline since amputation.      Vision Vision Assessment?: No apparent visual deficits   Perception Perception Perception Tested?: No   Praxis Praxis Praxis tested?: Within functional limits    Pertinent Vitals/Pain Pain Assessment: No/denies pain     Hand Dominance Right   Extremity/Trunk Assessment Upper Extremity Assessment Upper Extremity Assessment: Overall WFL for tasks assessed   Lower Extremity Assessment Lower Extremity Assessment: Defer to PT evaluation   Cervical / Trunk Assessment Cervical / Trunk Assessment: Normal   Communication Communication Communication: No difficulties   Cognition Arousal/Alertness: Awake/alert Behavior During Therapy: WFL for tasks assessed/performed Overall Cognitive Status: Within Functional Limits for tasks assessed                                Home Living Family/patient expects to be discharged to:: Private residence Living Arrangements: Alone Available Help at Discharge: Personal care attendant;Family Type of Home: House Home Access: Level entry  Home Layout: One level     Bathroom Shower/Tub: Teacher, early years/pre: Standard     Home Equipment: Environmental consultant - 2 wheels;Cane - single point;Wheelchair - manual;Tub bench;Bedside commode   Additional Comments: wc in the house and walks on prosthesis outside of house      Prior  Functioning/Environment          Comments: Aide 5 hours a day 7 days a week for help with  ADL's and IADL's                              End of Session Equipment Utilized During Treatment: Rolling walker;Other (comment) (LLE prosthesis) Nurse Communication: Mobility status  Activity Tolerance: Patient tolerated treatment well Patient left: in bed;with call bell/phone within reach;with family/visitor present   Time: 7253-6644 OT Time Calculation (min): 25 min Charges:  OT General Charges $OT Visit: 1 Procedure OT Evaluation $Initial OT Evaluation Tier I: 1 Procedure OT Treatments $Self Care/Home Management : 8-22 mins G-Codes: OT G-codes **NOT FOR INPATIENT CLASS** Functional Assessment Tool Used: clinical judgement Functional Limitation: Self care Self Care Current Status (I3474): At least 1 percent but less than 20 percent impaired, limited or restricted Self Care Goal Status (Q5956): At least 1 percent but less than 20 percent impaired, limited or restricted Self Care Discharge Status [phone]): At least 1 percent but less than 20 percent impaired, limited or restricted  MCGUIRE,JAMES OTR/L 02/25/2015, 3:10 PM

## 2015-02-25 NOTE — Interval H&P Note (Signed)
History and Physical Interval Note:  02/25/2015 12:49 PM  [name]  has presented today for surgery, with the diagnosis of Anemia and GI bleeding  The various methods of treatment have been discussed with the patient and family. After consideration of risks, benefits and other options for treatment, the patient has consented to  Procedure(s): ENTEROSCOPY (N/A) as a surgical intervention .  The patient's history has been reviewed, patient examined, no change in status, stable for surgery.  I have reviewed the patient's chart and labs.  Questions were answered to the patient's satisfaction.    The recent H&P (dated *9/30916**) was reviewed, the patient was examined and there is no change in the patients condition since that H&P was completed.   Erskine Emery  02/25/2015, 12:49 PM    Erskine Emery

## 2015-02-25 NOTE — Progress Notes (Signed)
Internal Medicine Attending:   I saw and examined the patient. I reviewed the resident's note and I agree with the resident's findings and plan as documented in the resident's note.  Symptomatically much improved today. Lightheadedness is resolved. So far this hospitalization she has received 2 transfusions of packed red blood cells, and 510 mg of Feraheme. No melanic stools or bright red blood per rectum during this hospitalization. Plan for enteroscopy today with GI to evaluate for further telangiectasias which might be amenable to intervention. Depending on the outcome of the procedure, we may observe her for one more night.

## 2015-02-25 NOTE — Transfer of Care (Signed)
Immediate Anesthesia Transfer of Care Note  Patient: [name]  Procedure(s) Performed: Procedure(s): ENTEROSCOPY (N/A)  Patient Location: PACU and Endoscopy Unit  Anesthesia Type:MAC  Level of Consciousness: awake, alert  and oriented  Airway & Oxygen Therapy: Patient Spontanous Breathing and Patient connected to nasal cannula oxygen  Post-op Assessment: Report given to RN and Post -op Vital signs reviewed and stable  Post vital signs: Reviewed and stable  Last Vitals:  Filed Vitals:   02/25/15 1327  BP: 164/97  Pulse: 71  Temp:   Resp: 15    Complications: No apparent anesthesia complications

## 2015-02-25 NOTE — Progress Notes (Signed)
Per Dr Marlowe Sax, ok to give Gabapentin now.

## 2015-02-25 NOTE — Progress Notes (Signed)
See Enteroscopy note.  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  Hopefully this will reduce her weight on chronic GI blood loss.  Recommend continue iron supplementation.  Signing off

## 2015-02-25 NOTE — Op Note (Signed)
[facility] [place], [address]   ENTEROSCOPY PROCEDURE REPORT     EXAM DATE: 02/25/2015  PATIENT NAME:      [name], [name]           MR #:      [id number]  BIRTHDATE:       [birth year]      VISIT #:     [phone]  ATTENDING:     Inda Castle, MD     STATUS:     inpatient ASSISTANT:      Catalina Pizza, and Monday, Crawford Givens MD: ASA CLASS:        Class II  INDICATIONS:  The patient is a 64 yr old female here for an enteroscopy procedure due to iron deficiency anemia. PROCEDURE PERFORMED:     Small bowel enteroscopy with control of bleeding  MEDICATIONS:     Monitored anesthesia care  CONSENT: The patient understands the risks and benefits of the procedure and understands that these risks include, but are not limited to: sedation, allergic reaction, infection, perforation and/or bleeding. Alternative means of evaluation and treatment include, among others: physical exam, x-rays, and/or surgical intervention. The patient elects to proceed with this endoscopic procedure.  DESCRIPTION OF PROCEDURE: During intra-op preparation period all mechanical & medical equipment was checked for proper function. Hand hygiene and appropriate measures for infection prevention was taken. After the risks, benefits and alternatives of the procedure were thoroughly explained, Informed consent was verified, confirmed and timeout was successfully executed by the treatment team. The    endoscope was introduced through the mouth and advanced to the proximal jejunum jejunum. The prep was   . The instrument was then slowly withdrawn while examining the mucosa circumferentially. The scope was then completely withdrawn from the patient and the procedure terminated. The pulse, BP, and O2 saturation were monitored and documented by the physician and the nursing staff throughout the entire procedure.  The patient was cared for as  planned according to standard protocol, then discharged to recovery in stable condition and with appropriate post procedure care. Estimated blood loss is zero unless otherwise noted in this procedure report.  DUODENUM: Two serpiginous arteriovenous malformations measuring 12mm in size were found in the duodenal bulb.  Argon plasma coagulation was applied to the sites.  With good treatment effect with complete hemostasis achieved.   Otherwise normal exam.    ADVERSE EVENTS:      There were no immediate complications.  IMPRESSIONS:     1.  Two arteriovenous malformations measuring 55mm in size were found in the duodenal bulb; Argon plasma coagulation was applied to the sites; with good treatment effect with complete hemostasis achieved 2.  Otherwise normal exam  RECOMMENDATIONS:     continue Fe supplementation RECALL:  _____________________________ Inda Castle, MD eSigned:  Inda Castle, MD 02/25/2015 1:43 PM   cc:     PATIENT NAME:  [name], [name] MR#: [id number]

## 2015-02-25 NOTE — Care Management Note (Signed)
Case Management Note  Patient Details  Name: [name] MRN: [id number] Date of Birth: 1951-01-18  Subjective/Objective:       NCM spoke with patient , offered hh services ,she states she does not want hhpt, she is doing good.  She state she just told physical therapist she did not need hhhpt.            Action/Plan:   Expected Discharge Date:                  Expected Discharge Plan:  [place]  In-House Referral:     Discharge planning Services  CM Consult  Post Acute Care Choice:    Choice offered to:     DME Arranged:    DME Agency:     HH Arranged:    [place] Agency:     Status of Service:  Completed, signed off  Medicare Important Message Given:    Date Medicare IM Given:    Medicare IM give by:    Date Additional Medicare IM Given:    Additional Medicare Important Message give by:     If discussed at [place] of Stay Meetings, dates discussed:    Additional Comments:  Zenon Mayo, RN 02/25/2015, 4:33 PM

## 2015-02-25 NOTE — Progress Notes (Signed)
   Subjective: Pt was seen and examined at bedside this AM. Denies any lightheadedness, HA, CP, palpitations, SOB, or abdominal pain. States she had one well-formed green colored BM last night.   Objective: Vital signs in last 24 hours: Filed Vitals:   02/24/15 1345 02/24/15 1531 02/24/15 2134 02/25/15 0549  BP: 150/88 175/77 117/73 142/63  Pulse: 103 101 89 65  Temp: 98.1 F (36.7 C) 98.3 F (36.8 C) 99.2 F (37.3 C) 98.2 F (36.8 C)  TempSrc: Axillary Oral Oral Oral  Resp: 18 19 18 18   Height:      SpO2: 98% 99% 98% 98%   Weight change:   Intake/Output Summary (Last 24 hours) at 02/25/15 0804 Last data filed at 02/24/15 2200  Gross per 24 hour  Intake    995 ml  Output      0 ml  Net    995 ml   Physical Exam  Constitutional: She is oriented to person, place, and time. She appears well-developed and well-nourished. No distress.  Cardiovascular: Normal rate, regular rhythm and intact distal pulses.  Exam reveals no gallop and no friction rub.   Grade 3/5 systolic ejection murmur appreciated at the right upper sternal border.   Pulmonary/Chest: Effort normal. No respiratory distress. She has no wheezes. She has no rales.  Abdominal: Soft. Bowel sounds are normal. She exhibits no distension. There is no tenderness.  Musculoskeletal: She exhibits no edema.  Neurological: She is alert and oriented to person, place, and time.  Skin: Skin is warm and dry.   Assessment/Plan: Principal Problem:   Symptomatic anemia  Symptomatic IDA Patient presented complaining of lightheadedness and dizziness. Hemoglobin 7.7 and hematocrit 25.0 upon admission yesterday. Normal MCV. Ferritin low, iron low, saturation low. TIBC high. She has received 2 units of PRBCs and Hgb 8.8 this morning. Patient's acute drop in hemoglobin could likely be due to an occult GI bleed, she reports having last episode of melena 4 days ago. Pateint does have a history of AV malformations and diverticulosis and is  due for ferraheme transfuions on 10/6. GI has been consulted and pt planned for enteroscopy today. Blood pressure stable at present.  -Follow-up AM CBC and transfuse if Hgb <7 -Pantoprazole 40 mg daily -Oxycodone-acetaminophen 5-325 1 tab q6 prn -Oxycodone IR 5 mg 1 tab q6 prn  -Enteroscopy today, follow-up results  -HHPT recommended   HTN -Continue atenolol 50 mg daily  Depression -Continue fluoxetine 40 mg daily -Continue trazodone 100 mg daily  Hepatitis C -Continue Ledipasvir-Sofosbuvir 90-400 mg daily  Hx of CVA, ACS, and PAD -continue Aspirin 81 mg daily for secondary prevention because benefit outweighs risk  Diet: carb modified   Code: FULL  Dispo: Disposition is deferred at this time, awaiting improvement of current medical problems.  Anticipated discharge in approximately 1-2 day(s).   The patient does have a current PCP ([name], MD) and does need an [facility] hospital follow-up appointment after discharge.  The patient does not have transportation limitations that hinder transportation to clinic appointments.  .Services Needed at time of discharge: Y = Yes, Blank = No PT:   OT:   RN:   Equipment:   Other:       Shela Leff, MD 02/25/2015, 8:04 AM

## 2015-02-25 NOTE — Progress Notes (Signed)
Internal Medicine Clinic Attending  I saw and evaluated the patient.  I personally confirmed the key portions of the history and exam documented by Dr. Saraiya and I reviewed pertinent patient test results.  The assessment, diagnosis, and plan were formulated together and I agree with the documentation in the resident's note.  

## 2015-02-26 DIAGNOSIS — B1920 Unspecified viral hepatitis C without hepatic coma: Secondary | ICD-10-CM | POA: Diagnosis not present

## 2015-02-26 DIAGNOSIS — D649 Anemia, unspecified: Secondary | ICD-10-CM | POA: Diagnosis not present

## 2015-02-26 DIAGNOSIS — F17210 Nicotine dependence, cigarettes, uncomplicated: Secondary | ICD-10-CM | POA: Diagnosis not present

## 2015-02-26 DIAGNOSIS — Z8673 Personal history of transient ischemic attack (TIA), and cerebral infarction without residual deficits: Secondary | ICD-10-CM | POA: Diagnosis not present

## 2015-02-26 DIAGNOSIS — E119 Type 2 diabetes mellitus without complications: Secondary | ICD-10-CM | POA: Diagnosis not present

## 2015-02-26 DIAGNOSIS — D509 Iron deficiency anemia, unspecified: Secondary | ICD-10-CM | POA: Diagnosis not present

## 2015-02-26 DIAGNOSIS — Z7982 Long term (current) use of aspirin: Secondary | ICD-10-CM | POA: Diagnosis not present

## 2015-02-26 DIAGNOSIS — B182 Chronic viral hepatitis C: Secondary | ICD-10-CM

## 2015-02-26 DIAGNOSIS — K5730 Diverticulosis of large intestine without perforation or abscess without bleeding: Secondary | ICD-10-CM | POA: Diagnosis not present

## 2015-02-26 DIAGNOSIS — I10 Essential (primary) hypertension: Secondary | ICD-10-CM | POA: Diagnosis not present

## 2015-02-26 DIAGNOSIS — Z89612 Acquired absence of left leg above knee: Secondary | ICD-10-CM | POA: Diagnosis not present

## 2015-02-26 DIAGNOSIS — F329 Major depressive disorder, single episode, unspecified: Secondary | ICD-10-CM | POA: Diagnosis not present

## 2015-02-26 DIAGNOSIS — Q2733 Arteriovenous malformation of digestive system vessel: Secondary | ICD-10-CM | POA: Diagnosis not present

## 2015-02-26 DIAGNOSIS — Z880 Allergy status to penicillin: Secondary | ICD-10-CM | POA: Diagnosis not present

## 2015-02-26 LAB — CBC
HCT: 28.7 % — ABNORMAL LOW (ref 36.0–46.0)
Hemoglobin: 9.1 g/dL — ABNORMAL LOW (ref 12.0–15.0)
MCH: 25.4 pg — AB (ref 26.0–34.0)
MCHC: 31.7 g/dL (ref 30.0–36.0)
MCV: 80.2 fL (ref 78.0–100.0)
PLATELETS: 245 10*3/uL (ref 150–400)
RBC: 3.58 MIL/uL — AB (ref 3.87–5.11)
RDW: 17.5 % — AB (ref 11.5–15.5)
WBC: 11.2 10*3/uL — ABNORMAL HIGH (ref 4.0–10.5)

## 2015-02-26 NOTE — Discharge Summary (Signed)
Name: [name] MRN: [id number] DOB: Mar 09, 1951 64 y.o. PCP: [name], MD  Date of Admission: 02/23/2015 12:45 PM Date of Discharge: 02/26/2015 Attending Physician: Dr. Beryle Beams  Discharge Diagnosis:  Principal Problem:   Symptomatic anemia Active Problems:   Essential hypertension, benign   Chronic hepatitis C virus infection   Phantom limb pain   Arteriovenous malformation of duodenum  Discharge Medications:   Medication List    TAKE these medications        ACCU-CHEK FASTCLIX LANCETS Misc  Check your blood sugar one time a day     amLODipine 10 MG tablet  Commonly known as:  NORVASC  Take 1 tablet (10 mg total) by mouth daily.     aspirin EC 81 MG tablet  Take 81 mg by mouth 2 (two) times daily.     atenolol 50 MG tablet  Commonly known as:  TENORMIN  TAKE 1 TABLET BY MOUTH EVERY MORNING     cetirizine 10 MG tablet  Commonly known as:  ZYRTEC  Take 10 mg by mouth every morning.     FLUoxetine 40 MG capsule  Commonly known as:  PROZAC  TAKE 1 CAPSULE BY MOUTH DAILY     gabapentin 400 MG capsule  Commonly known as:  NEURONTIN  TAKE 2 CAPSULE BY MOUTH THREE TIMES A DAY     glucose blood test strip  Commonly known as:  ACCU-CHEK SMARTVIEW  Check blood sugar  one time a day     hydrochlorothiazide 25 MG tablet  Commonly known as:  HYDRODIURIL  Take 1 tablet (25 mg total) by mouth every morning.     lactose free nutrition Liqd  Take 237 mLs by mouth 3 (three) times daily with meals.     ENSURE ACTIVE HIGH PROTEIN Liqd  Take 1 Bottle by mouth 4 (four) times daily -  with meals and at bedtime.     Ledipasvir-Sofosbuvir 90-400 MG Tabs  Commonly known as:  HARVONI  Take 1 tablet by mouth daily.     lisinopril 40 MG tablet  Commonly known as:  PRINIVIL,ZESTRIL  Take 1 tablet (40 mg total) by mouth every morning.     magnesium (amino acid chelate) 133 MG tablet  Take 1 tablet by mouth 2 (two) times daily.     omeprazole 20 MG capsule    Commonly known as:  PRILOSEC  Take 1 capsule (20 mg total) by mouth daily.     oxyCODONE-acetaminophen 10-325 MG tablet  Commonly known as:  PERCOCET  Take 1 tablet by mouth every 6 (six) hours as needed for pain.     potassium chloride 20 MEQ/15ML (10%) Soln  TAKE 15ML BY MOUTH EVERY DAY     traZODone 100 MG tablet  Commonly known as:  DESYREL  TAKE 1 TABLET BY MOUTH EVERY NIGHT AT BEDTIME     vitamin C 500 MG tablet  Commonly known as:  ASCORBIC ACID  Take 500 mg by mouth every morning.        Disposition and follow-up:   [name] was discharged from [facility] in Good condition.  At the hospital follow up visit please address:  1.  Symptomatic Anemia 2/2 GIB: 2 AVMs cauterized. CBC stable on admission, patient will follow up with GI.   2.  Labs / imaging needed at time of follow-up: CBC  3.  Pending labs/ test needing follow-up: None  Follow-up Appointments: Follow-up Information    Follow up with Erskine Emery,  MD On 03/02/2015.   Specialty:  Gastroenterology   Why:  2:00 pm   Contact information:   [address] [place] [address] [phone]       Follow up with Berline Lopes, MD. Go on 03/10/2015.   Specialty:  Internal Medicine   Why:  Follow up appointment at 8:45 am.   Contact information:   [place] [address] [phone]       Discharge Instructions: Discharge Instructions    Diet - low sodium heart healthy    Complete by:  As directed      Face-to-face encounter (required for Medicare/Medicaid patients)    Complete by:  As directed   I Osa Craver certify that this patient is under my care and that I, or a nurse practitioner or physician's assistant working with me, had a face-to-face encounter that meets the physician face-to-face encounter requirements with this patient on 02/26/2015. The encounter with the patient was in whole, or in part for the following medical condition(s) which is  the primary reason for home health care (List medical condition): Right BKA, Symptomatic Anemia  The encounter with the patient was in whole, or in part, for the following medical condition, which is the primary reason for home health care:  Right BKA, Hepatitis C  I certify that, based on my findings, the following services are medically necessary home health services:  Nursing  Reason for Medically Necessary Home Health Services:  Skilled Nursing- Changes in Medication/Medication Management  My clinical findings support the need for the above services:  Pain interferes with ambulation/mobility  Further, I certify that my clinical findings support that this patient is homebound due to:  Unsafe ambulation due to balance issues     Home Health    Complete by:  As directed   To provide the following care/treatments:  [place]     Increase activity slowly    Complete by:  As directed            Consultations:  Gastroenterology  Procedures Performed:  Dg Knee Complete 4 Views Right  02/22/2015   CLINICAL DATA:  fall with history of osteoporosis i fell 2 days ago on rt knee,been having ant rt knee pain  : RIGHT KNEE - COMPLETE 4+ VIEW  COMPARISON:  02/11/2013  FINDINGS: Mild narrowing and osteophyte formation laterally. Moderate osteophyte formation and narrowing medial compartment. Diffuse osteopenia. Moderate osteophyte formation in the patellofemoral compartment. Very small joint effusion.  IMPRESSION: Moderate tricompartmental arthritic change.   Electronically Signed   By: Skipper Cliche M.D.   On: 02/22/2015 14:03   Dg Hip Unilat With Pelvis 2-3 Views Right  02/22/2015   CLINICAL DATA:  Fall at home.  EXAM: DG HIP (WITH OR WITHOUT PELVIS) 2-3V RIGHT  COMPARISON:  None.  FINDINGS: There is no evidence of hip fracture or dislocation. There is no evidence of arthropathy or other focal bone abnormality.  IMPRESSION: Negative.   Electronically Signed   By: Kathreen Devoid   On: 02/22/2015  14:04   Admission HPI: Patient is a 64 yo F with a PMHx of HTN, NIDDM, IDA due to AVM+diverticulosis, HCV on Harvoni, Depression, GERD, vitamin D deficiency on weekly dose, and h/o hypokalemia. Patient refuses to speak and as such history obtained from [place] clinic notes. Patient presented to the [facility] yesterday complaining of lightheadedness and dizziness. CBC revealed a Hgb of 7.4 today and as such patient was asked to come into the  hospital. Patient has been receiving ferraheme transfuions every 3-4 months at [place] center and her next appointment is on 03/02/15. Last PRBC transfusion was in 2012. She has a history of AV malformations and history of hernia repair done in march with post-op complications requiring wound vac. Clinic note also states patient reported having some episodes of melena last week.   Hospital Course by problem list: Principal Problem:   Symptomatic anemia Active Problems:   Essential hypertension, benign   Chronic hepatitis C virus infection   Phantom limb pain   Arteriovenous malformation of duodenum   Symptomatic Anemia 2/2 GIB: Patient was admitted with symptomatic anemia from GI blood loss. Patient was lightheaded and dizzy after hemoglobin dropped from 11.4 to 7.2 over the course of 1-2 months. She self reported several bloody bowel movements. Patient was transfused 2 units for symptomatic anemia. She underwent enteroscopy which revealed 2 Duodenal AVMs, which were cauterized. Patient was stable overnight without any recurrent melena or hematochezia. Hemoglobin was stable this morning at 8.8. GI recommends continuing iron supplementation as outpatient and following up as an outpatient. Patient should continue Fereheme infusions as outpatient.  HTN: Controlled during admission on atenolol 50 mg daily. Home medications were resumed on discharge  Depression: Stable. We continued fluoxetine 40 mg daily and trazodone 100 mg daily during admission and on  discharge.   Hepatitis C: Patient follows with ID clinic. We continued Ledipasvir-Sofosbuvir 90-400 mg daily during admission and on discharge.  Hx of CVA, ACS, and PAD: We continued Aspirin 81 mg daily for secondary prevention because benefit outweighs risk.  Discharge Vitals:   BP 148/72 mmHg  Pulse 77  Temp(Src) 99.2 F (37.3 C) (Oral)  Resp 18  Ht 5\' 3"  (1.6 m)  Wt 133 lb 13.1 oz (60.7 kg)  BMI 23.71 kg/m2  SpO2 97%  Discharge Labs:  Results for orders placed or performed during the hospital encounter of 02/23/15 (from the past 24 hour(s))  CBC     Status: Abnormal   Collection Time: 02/26/15  6:00 AM  Result Value Ref Range   WBC 11.2 (H) 4.0 - 10.5 K/uL   RBC 3.58 (L) 3.87 - 5.11 MIL/uL   Hemoglobin 9.1 (L) 12.0 - 15.0 g/dL   HCT 28.7 (L) 36.0 - 46.0 %   MCV 80.2 78.0 - 100.0 fL   MCH 25.4 (L) 26.0 - 34.0 pg   MCHC 31.7 30.0 - 36.0 g/dL   RDW 17.5 (H) 11.5 - 15.5 %   Platelets 245 150 - 400 K/uL    Signed: Osa Craver, DO PGY-2 Internal Medicine Resident Pager # [phone] 02/26/2015 10:57 AM   Services Ordered on Discharge: [address] Ordered on Discharge: None

## 2015-02-26 NOTE — Progress Notes (Signed)
Subjective:  Patient was seen and examined this morning. She feels well and denies any blood in her stool. She had a normal bowel movement this morning and wishes to go home.   Objective: Filed Vitals:   02/25/15 2124 02/26/15 0503 02/26/15 0612 02/26/15 0700  BP: 168/80 181/94 172/75 148/72  Pulse: 73 77    Temp: 98.3 F (36.8 C) 99.2 F (37.3 C)    TempSrc: Oral Oral    Resp: 18 18    Height:      Weight:      SpO2: 97% 97%     General: Vital signs reviewed.  Patient is well-developed and well-nourished, in no acute distress and cooperative with exam.  Cardiovascular: RRR, S1 normal, S2 normal, no murmurs, gallops, or rubs. Pulmonary/Chest: Clear to auscultation bilaterally, no wheezes, rales, or rhonchi. Abdominal: Soft, non-tender, non-distended, BS +, no guarding present.  Extremities: S/p R BKA. No lower extremity edema in left lower extremity Skin: Warm, dry and intact. No rashes or erythema. Psychiatric: Normal mood and affect. speech and behavior is normal.    Weight change:   Intake/Output Summary (Last 24 hours) at 02/26/15 1041 Last data filed at 02/26/15 1027  Gross per 24 hour  Intake    600 ml  Output    950 ml  Net   -350 ml   Lab Results: Basic Metabolic Panel:  Recent Labs Lab 02/23/15 1830 02/24/15 0436 02/25/15 0625  NA 135 136  --   K 3.5 3.5  --   CL 100* 102  --   CO2 28 25  --   GLUCOSE 119* 156*  --   BUN 10 15  --   CREATININE 0.87 0.93  --   CALCIUM 8.7* 8.4*  --   MG  --  1.4* 1.6*   Liver Function Tests:  Recent Labs Lab 02/23/15 1501 02/23/15 1830  AST 27 29  ALT 25 24  ALKPHOS 80 76  BILITOT 0.3 0.3  PROT 7.0 6.5  ALBUMIN 3.2* 3.3*   CBC:  Recent Labs Lab 02/25/15 0625 02/26/15 0600  WBC 9.2 11.2*  HGB 8.8* 9.1*  HCT 28.4* 28.7*  MCV 79.6 80.2  PLT 238 245   Coagulation:  Recent Labs Lab 02/23/15 1501  LABPROT 14.7  INR 1.13   Anemia Panel:  Recent Labs Lab 02/23/15 1810  FERRITIN 7*  TIBC  543*  IRON 14*  RETICCTPCT 2.6   Urine Drug Screen: Drugs of Abuse     Component Value Date/Time   LABOPIA NONE DETECTED 03/05/2012 1643   LABOPIA NEG 06/20/2010 2242   COCAINSCRNUR NONE DETECTED 03/05/2012 1643   COCAINSCRNUR POS* 06/20/2010 2242   LABBENZ NONE DETECTED 03/05/2012 1643   LABBENZ NEG 06/20/2010 2242   AMPHETMU NONE DETECTED 03/05/2012 1643   AMPHETMU NEG 06/20/2010 2242   THCU NONE DETECTED 03/05/2012 1643   LABBARB NONE DETECTED 03/05/2012 1643     Micro Results: Recent Results (from the past 240 hour(s))  MRSA PCR Screening     Status: None   Collection Time: 02/23/15  1:09 PM  Result Value Ref Range Status   MRSA by PCR NEGATIVE NEGATIVE Final    Comment:        The GeneXpert MRSA Assay (FDA approved for NASAL specimens only), is one component of a comprehensive MRSA colonization surveillance program. It is not intended to diagnose MRSA infection nor to guide or monitor treatment for MRSA infections.    Medications:  I have reviewed the  patient's current medications. Prior to Admission:  Prescriptions prior to admission  Medication Sig Dispense Refill Last Dose  . amLODipine (NORVASC) 10 MG tablet Take 1 tablet (10 mg total) by mouth daily. 30 tablet 3 02/23/2015 at Unknown time  . aspirin EC 81 MG tablet Take 81 mg by mouth 2 (two) times daily.    02/23/2015 at Unknown time  . atenolol (TENORMIN) 50 MG tablet TAKE 1 TABLET BY MOUTH EVERY MORNING 30 tablet 9 02/23/2015 at 0800  . cetirizine (ZYRTEC) 10 MG tablet Take 10 mg by mouth every morning.    02/23/2015 at Unknown time  . gabapentin (NEURONTIN) 400 MG capsule TAKE 2 CAPSULE BY MOUTH THREE TIMES A DAY 180 capsule 3 02/23/2015 at Unknown time  . glucose blood (ACCU-CHEK SMARTVIEW) test strip Check blood sugar  one time a day 50 each 12 02/22/2015 at Unknown time  . hydrochlorothiazide (HYDRODIURIL) 25 MG tablet Take 1 tablet (25 mg total) by mouth every morning. 90 tablet 3 02/22/2015 at Unknown time   . lactose free nutrition (BOOST PLUS) LIQD Take 237 mLs by mouth 3 (three) times daily with meals. 237 mL 0 Unknown at Unknown time  . Ledipasvir-Sofosbuvir (HARVONI) 90-400 MG TABS Take 1 tablet by mouth daily. 28 tablet 2 02/23/2015 at Unknown time  . lisinopril (PRINIVIL,ZESTRIL) 40 MG tablet Take 1 tablet (40 mg total) by mouth every morning. 90 tablet 4 02/23/2015 at Unknown time  . Nutritional Supplements (ENSURE ACTIVE HIGH PROTEIN) LIQD Take 1 Bottle by mouth 4 (four) times daily -  with meals and at bedtime. 237 mL 12 02/23/2015 at Unknown time  . omeprazole (PRILOSEC) 20 MG capsule Take 1 capsule (20 mg total) by mouth daily. 30 capsule 11 02/23/2015 at Unknown time  . oxyCODONE-acetaminophen (PERCOCET) 10-325 MG tablet Take 1 tablet by mouth every 6 (six) hours as needed for pain. 120 tablet 0 02/23/2015 at Unknown time  . potassium chloride 20 MEQ/15ML (10%) SOLN TAKE 15ML BY MOUTH EVERY DAY (Patient taking differently: 15 mEq. TAKE 15ML BY MOUTH EVERY DAY) 450 mL 1 02/23/2015 at Unknown time  . traZODone (DESYREL) 100 MG tablet Take 100 mg by mouth at bedtime.   02/22/2015 at Unknown time  . vitamin C (ASCORBIC ACID) 500 MG tablet Take 500 mg by mouth every morning.   02/23/2015 at Unknown time  . [DISCONTINUED] Vitamin D, Ergocalciferol, (DRISDOL) 50000 UNITS CAPS capsule Take 1 capsule (50,000 Units total) by mouth every 7 (seven) days. (Patient taking differently: Take 50,000 Units by mouth every 7 (seven) days. Take on Thursday) 8 capsule 0 Past Week at Unknown time  . ACCU-CHEK FASTCLIX LANCETS MISC Check your blood sugar one time a day 102 each 5 Unknown at Unknown time  . Specialty Vitamins Products (MAGNESIUM, AMINO ACID CHELATE,) 133 MG tablet Take 1 tablet by mouth 2 (two) times daily.   unknown at unknown   Scheduled Meds: . aspirin EC  81 mg Oral Daily  . atenolol  50 mg Oral q morning - 10a  . cholecalciferol  1,000 Units Oral Daily  . feeding supplement  1 Container Oral TID BM    . FLUoxetine  40 mg Oral Daily  . gabapentin  400 mg Oral TID  . Ledipasvir-Sofosbuvir  1 tablet Oral Daily  . magnesium sulfate 1 - 4 g bolus IVPB  2 g Intravenous Once  . pantoprazole  40 mg Oral Q0600  . traZODone  100 mg Oral QHS   Continuous Infusions:  PRN Meds:.diclofenac  sodium, oxyCODONE-acetaminophen **AND** oxyCODONE Assessment/Plan: Principal Problem:   Symptomatic anemia Active Problems:   Arteriovenous malformation of duodenum  Symptomatic Anemia 2/2 GIB: Patient underwent enteroscopy yesterday. 2 Duodenal AVMS were cauterized. Patient was stable overnight without any recurrent melena or hematochezia. Hemoglobin was stable this morning at 8.8. GI recommends continuing iron supplementation as outpatient and following up as an outpatient.  -Discharge to home -Follow up with GI and IM as outpatient -Outpatient CBC -Pantoprazole 40 mg daily -HH services -Continue Fereheme infusions as outpatient  HTN: 148/72 this morning on atenolol 50 mg daily. -Will resume home medications on discharge  Depression: Stable. -Continue fluoxetine 40 mg daily -Continue trazodone 100 mg daily  Hepatitis C: Patient follows with ID clinic. -Continue Ledipasvir-Sofosbuvir 90-400 mg daily  Hx of CVA, ACS, and PAD -Continue Aspirin 81 mg daily for secondary prevention because benefit outweighs risk  Diet: Carb modified  Code: FULL  Dispo: Anticipated discharge in approximately today.   The patient does have a current PCP ([name], MD) and does need an [facility] hospital follow-up appointment after discharge.  The patient does have transportation limitations that hinder transportation to clinic appointments.  .Services Needed at time of discharge: Y = Yes, Blank = No PT:   OT:   RN:   Equipment:   Other:       Osa Craver, DO PGY-1 Internal Medicine Resident Pager # [phone] 02/26/2015 10:41 AM

## 2015-02-26 NOTE — Discharge Instructions (Signed)
Please take all of your medications as prescribed.   Please follow up with Dr. Deatra Ina and with Dr. Marlowe Sax as listed in this packet.   Please continue to receive your Fereheme transfusions as scheduled.

## 2015-02-26 NOTE — Progress Notes (Signed)
Patient was discharged home by MD order; discharged instructions review and give to patient with care notes; IV DIC;  patient will be escorted to the car by nurse tech via wheelchair.  

## 2015-02-28 ENCOUNTER — Telehealth: Payer: Self-pay | Admitting: *Deleted

## 2015-02-28 NOTE — Telephone Encounter (Signed)
Transition Care Management Follow-up Telephone Call   Date discharged?  02/25/15   How have you been since you were released from the hospital?  Doing good   Do you understand why you were in the hospital? Yes, 2 blood transfusions   Do you understand the discharge instructions? Yes   Where were you discharged to? Home   Items Reviewed:  Medications reviewed: I have 2 Rx's and have filled them  Allergies reviewed:  Yes  Dietary changes reviewed: Regular diet  Referrals reviewed:  I have all the appointments   Functional Questionnaire:   Activities of Daily Living (ADLs):   She states they are independent in the following: Pt has caregiver to help with ADL States they require assistance with the following: personal care   Any transportation issues/conc    I have transportation.   Any patient concerns? No   Confirmed importance and date/time of follow-up visits scheduled Appointment time changed to afternoon for pt.   Provider Appointment booked with Dr Marlowe Sax  Confirmed with patient if condition begins to worsen call PCP or go to the ER.  Patient was given the office number and encouraged to call back with question or concerns.  Yes.

## 2015-03-01 ENCOUNTER — Other Ambulatory Visit: Payer: Medicare Other

## 2015-03-01 DIAGNOSIS — Z9289 Personal history of other medical treatment: Secondary | ICD-10-CM | POA: Diagnosis not present

## 2015-03-01 DIAGNOSIS — B182 Chronic viral hepatitis C: Secondary | ICD-10-CM | POA: Diagnosis not present

## 2015-03-01 LAB — CBC
HCT: 34.7 % — ABNORMAL LOW (ref 36.0–46.0)
Hemoglobin: 11.2 g/dL — ABNORMAL LOW (ref 12.0–15.0)
MCH: 26.2 pg (ref 26.0–34.0)
MCHC: 32.3 g/dL (ref 30.0–36.0)
MCV: 81.3 fL (ref 78.0–100.0)
MPV: 8.2 fL — ABNORMAL LOW (ref 8.6–12.4)
Platelets: 341 K/uL (ref 150–400)
RBC: 4.27 MIL/uL (ref 3.87–5.11)
RDW: 20.8 % — ABNORMAL HIGH (ref 11.5–15.5)
WBC: 9.2 K/uL (ref 4.0–10.5)

## 2015-03-02 ENCOUNTER — Ambulatory Visit (INDEPENDENT_AMBULATORY_CARE_PROVIDER_SITE_OTHER): Payer: Medicare Other | Admitting: Gastroenterology

## 2015-03-02 ENCOUNTER — Encounter (HOSPITAL_COMMUNITY): Payer: Self-pay | Admitting: *Deleted

## 2015-03-02 ENCOUNTER — Encounter: Payer: Self-pay | Admitting: Gastroenterology

## 2015-03-02 ENCOUNTER — Observation Stay (HOSPITAL_COMMUNITY)
Admission: AD | Admit: 2015-03-02 | Discharge: 2015-03-04 | Disposition: A | Discharge status: Home / Self Care | Payer: Medicare Other | Source: Ambulatory Visit | Attending: Internal Medicine | Admitting: Internal Medicine

## 2015-03-02 ENCOUNTER — Telehealth: Payer: Self-pay | Admitting: Gastroenterology

## 2015-03-02 VITALS — BP 122/64 | HR 72 | Ht 63.0 in | Wt 167.8 lb

## 2015-03-02 DIAGNOSIS — I10 Essential (primary) hypertension: Secondary | ICD-10-CM | POA: Diagnosis not present

## 2015-03-02 DIAGNOSIS — K31819 Angiodysplasia of stomach and duodenum without bleeding: Secondary | ICD-10-CM

## 2015-03-02 DIAGNOSIS — D500 Iron deficiency anemia secondary to blood loss (chronic): Secondary | ICD-10-CM | POA: Diagnosis present

## 2015-03-02 DIAGNOSIS — K219 Gastro-esophageal reflux disease without esophagitis: Secondary | ICD-10-CM | POA: Diagnosis not present

## 2015-03-02 DIAGNOSIS — K921 Melena: Secondary | ICD-10-CM

## 2015-03-02 DIAGNOSIS — K922 Gastrointestinal hemorrhage, unspecified: Secondary | ICD-10-CM | POA: Diagnosis present

## 2015-03-02 DIAGNOSIS — T8189XS Other complications of procedures, not elsewhere classified, sequela: Secondary | ICD-10-CM | POA: Diagnosis not present

## 2015-03-02 DIAGNOSIS — E873 Alkalosis: Secondary | ICD-10-CM | POA: Insufficient documentation

## 2015-03-02 DIAGNOSIS — E1122 Type 2 diabetes mellitus with diabetic chronic kidney disease: Secondary | ICD-10-CM

## 2015-03-02 DIAGNOSIS — Z86010 Personal history of colonic polyps: Secondary | ICD-10-CM | POA: Insufficient documentation

## 2015-03-02 DIAGNOSIS — T8189XA Other complications of procedures, not elsewhere classified, initial encounter: Secondary | ICD-10-CM | POA: Diagnosis present

## 2015-03-02 DIAGNOSIS — K625 Hemorrhage of anus and rectum: Secondary | ICD-10-CM | POA: Diagnosis present

## 2015-03-02 DIAGNOSIS — Z8619 Personal history of other infectious and parasitic diseases: Secondary | ICD-10-CM | POA: Diagnosis present

## 2015-03-02 DIAGNOSIS — F32A Depression, unspecified: Secondary | ICD-10-CM | POA: Diagnosis present

## 2015-03-02 DIAGNOSIS — Y838 Other surgical procedures as the cause of abnormal reaction of the patient, or of later complication, without mention of misadventure at the time of the procedure: Secondary | ICD-10-CM | POA: Diagnosis not present

## 2015-03-02 DIAGNOSIS — E119 Type 2 diabetes mellitus without complications: Secondary | ICD-10-CM | POA: Diagnosis not present

## 2015-03-02 DIAGNOSIS — Z720 Tobacco use: Secondary | ICD-10-CM | POA: Insufficient documentation

## 2015-03-02 DIAGNOSIS — F329 Major depressive disorder, single episode, unspecified: Secondary | ICD-10-CM | POA: Diagnosis present

## 2015-03-02 DIAGNOSIS — R630 Anorexia: Secondary | ICD-10-CM | POA: Diagnosis not present

## 2015-03-02 DIAGNOSIS — G479 Sleep disorder, unspecified: Secondary | ICD-10-CM | POA: Insufficient documentation

## 2015-03-02 DIAGNOSIS — E1140 Type 2 diabetes mellitus with diabetic neuropathy, unspecified: Secondary | ICD-10-CM

## 2015-03-02 LAB — BASIC METABOLIC PANEL
Anion gap: 9 (ref 5–15)
BUN: 30 mg/dL — AB (ref 6–20)
CHLORIDE: 102 mmol/L (ref 101–111)
CO2: 25 mmol/L (ref 22–32)
CREATININE: 0.97 mg/dL (ref 0.44–1.00)
Calcium: 8.4 mg/dL — ABNORMAL LOW (ref 8.9–10.3)
GFR calc Af Amer: >60 mL/min (ref >60)
GFR calc non Af Amer: >60 mL/min (ref >60)
Glucose, Bld: 140 mg/dL — ABNORMAL HIGH (ref 65–99)
Potassium: 3.3 mmol/L — ABNORMAL LOW (ref 3.5–5.1)
Sodium: 136 mmol/L (ref 135–145)

## 2015-03-02 LAB — HEPATITIS C RNA QUANTITATIVE: HCV Quantitative: NOT DETECTED IU/mL (ref <15)

## 2015-03-02 LAB — CBC
HEMATOCRIT: 22.4 % — AB (ref 36.0–46.0)
HEMOGLOBIN: 7 g/dL — AB (ref 12.0–15.0)
MCH: 26.6 pg (ref 26.0–34.0)
MCHC: 31.3 g/dL (ref 30.0–36.0)
MCV: 85.2 fL (ref 78.0–100.0)
Platelets: 304 10*3/uL (ref 150–400)
RBC: 2.63 MIL/uL — ABNORMAL LOW (ref 3.87–5.11)
RDW: 22.2 % — ABNORMAL HIGH (ref 11.5–15.5)
WBC: 12.8 10*3/uL — ABNORMAL HIGH (ref 4.0–10.5)

## 2015-03-02 LAB — OCCULT BLOOD X 1 CARD TO LAB, STOOL: Fecal Occult Bld: POSITIVE — AB

## 2015-03-02 LAB — PREPARE RBC (CROSSMATCH)

## 2015-03-02 LAB — GLUCOSE, CAPILLARY
Glucose-Capillary: 163 mg/dL — ABNORMAL HIGH (ref 65–99)
Glucose-Capillary: 241 mg/dL — ABNORMAL HIGH (ref 65–99)

## 2015-03-02 MED ORDER — PANTOPRAZOLE SODIUM 40 MG IV SOLR
40.0000 mg | Freq: Every day | INTRAVENOUS | Status: DC
  Administered 2015-03-03 (×2): 40 mg via INTRAVENOUS
  Filled 2015-03-02 (×2): qty 40

## 2015-03-02 MED ORDER — OXYCODONE-ACETAMINOPHEN 10-325 MG PO TABS: 1.0000 | ORAL_TABLET | Freq: Four times a day (QID) | ORAL | Status: DC | PRN

## 2015-03-02 MED ORDER — BOOST PLUS PO LIQD
237.0000 mL | Freq: Three times a day (TID) | ORAL | Status: DC
  Filled 2015-03-02 (×7): qty 237

## 2015-03-02 MED ORDER — BOOST / RESOURCE BREEZE PO LIQD: 1.0000 | Freq: Three times a day (TID) | ORAL | Status: DC

## 2015-03-02 MED ORDER — ATENOLOL 50 MG PO TABS
50.0000 mg | ORAL_TABLET | Freq: Every morning | ORAL | Status: DC
  Administered 2015-03-03 – 2015-03-04 (×2): 50 mg via ORAL
  Filled 2015-03-02 (×2): qty 1

## 2015-03-02 MED ORDER — FLUOXETINE HCL 20 MG PO CAPS
40.0000 mg | ORAL_CAPSULE | Freq: Every day | ORAL | Status: DC
  Administered 2015-03-03 – 2015-03-04 (×2): 40 mg via ORAL
  Filled 2015-03-02 (×3): qty 2

## 2015-03-02 MED ORDER — TRAZODONE HCL 100 MG PO TABS
100.0000 mg | ORAL_TABLET | Freq: Every day | ORAL | Status: DC
  Administered 2015-03-02: 100 mg via ORAL
  Filled 2015-03-02: qty 1

## 2015-03-02 MED ORDER — LEDIPASVIR-SOFOSBUVIR 90-400 MG PO TABS
1.0000 | ORAL_TABLET | Freq: Every day | ORAL | Status: DC
  Filled 2015-03-02 (×2): qty 1

## 2015-03-02 MED ORDER — OXYCODONE-ACETAMINOPHEN 5-325 MG PO TABS
1.0000 | ORAL_TABLET | Freq: Four times a day (QID) | ORAL | Status: DC | PRN
  Administered 2015-03-04: 1 via ORAL
  Filled 2015-03-02: qty 1

## 2015-03-02 MED ORDER — AMLODIPINE BESYLATE 10 MG PO TABS
10.0000 mg | ORAL_TABLET | Freq: Every day | ORAL | Status: DC
  Administered 2015-03-03 – 2015-03-04 (×2): 10 mg via ORAL
  Filled 2015-03-02 (×2): qty 1

## 2015-03-02 MED ORDER — LORATADINE 10 MG PO TABS
10.0000 mg | ORAL_TABLET | Freq: Every day | ORAL | Status: DC
  Administered 2015-03-03 – 2015-03-04 (×2): 10 mg via ORAL
  Filled 2015-03-02 (×2): qty 1

## 2015-03-02 MED ORDER — PANTOPRAZOLE SODIUM 40 MG PO TBEC: 40.0000 mg | DELAYED_RELEASE_TABLET | Freq: Every day | ORAL | Status: DC

## 2015-03-02 MED ORDER — OXYCODONE HCL 5 MG PO TABS: 5.0000 mg | ORAL_TABLET | Freq: Four times a day (QID) | ORAL | Status: DC | PRN

## 2015-03-02 MED ORDER — ENSURE ENLIVE PO LIQD
1.0000 | Freq: Three times a day (TID) | ORAL | Status: DC
  Filled 2015-03-02 (×4): qty 237

## 2015-03-02 MED ORDER — HYDROCHLOROTHIAZIDE 25 MG PO TABS
25.0000 mg | ORAL_TABLET | Freq: Every morning | ORAL | Status: DC
  Administered 2015-03-03 – 2015-03-04 (×2): 25 mg via ORAL
  Filled 2015-03-02 (×4): qty 1

## 2015-03-02 MED ORDER — MG-PLUS PROTEIN 133 MG PO TABS: 1.0000 | ORAL_TABLET | Freq: Two times a day (BID) | ORAL | Status: DC

## 2015-03-02 MED ORDER — SODIUM CHLORIDE 0.9 % IV SOLN
Freq: Once | INTRAVENOUS | Status: AC
Start: 2015-03-02 — End: 2015-03-02
  Administered 2015-03-02: 23:00:00 via INTRAVENOUS

## 2015-03-02 MED ORDER — LISINOPRIL 40 MG PO TABS
40.0000 mg | ORAL_TABLET | Freq: Every morning | ORAL | Status: DC
  Administered 2015-03-03 – 2015-03-04 (×2): 40 mg via ORAL
  Filled 2015-03-02 (×2): qty 1

## 2015-03-02 NOTE — Progress Notes (Signed)
_                                                                                                             64 year old Afro-American female discharged several days ago on admission for GI bleeding.  Marland Kitchen Hx HTN, NIDDM. S/P left AKA. Hx HCV on Harvoni through the end of October 2016 under the supervision of Dr. Linus Salmons. Liver biopsy in 2007 confirmed grade 1, stage I chronic hepatitis Not known to have cirrhosis. On abdominal ultrasound with the last progress. Of 12/30/2014 liver parenchyma was normal. Metavir fibrosis score with some F3 and F4 with high risk of fibrosis. S/P open incisional hernia repair with mesh in 07/2014, she required subsequent wound VAC and the incision has been slow to heal. For this she is follows with Dr. Ninfa Linden. Chronic iron deficiency anemia is treated with Feraheme transfusions at the cancer center every 3-4 months. Last infusion was on 12/23/14 and she has an upcoming appointment at the cancer center for 03/02/2015. She was last transfused with blood, 2 PRBCs, in January 2 016. She was also transfused in 03/2014.  Long-standing history of anemia and investigations of GI bleeding. Innumerable EGDs. Previous colonoscopy, flexible sigmoidoscopy and at least a couple of capsule endoscopies. EGD in 8/201 to perform because of active bleeding on capsule Endo. Dr. Benson Norway saw to AVMs 7 D2 was bleeding, the other at D3 was not bleeding. Both of these were treated with endoclips. On subsequent EGDs in 2012 and 2013 studies were either normal or small hiatal hernia noted. Enteroscopies x 2 in 11/2012 revealed duodenal AVMs all of which were ablated, none of which were actively bleeding. A flexible sigmoidoscopy in July 2013 due to painless hematochezia noted blood and diverticulosis in the left colon. a sigmoid polyp was removed at colonoscopy in 2011.  In 01/2012 she underwent partial left colectomy because of recurrent lower GI bleeding.  Patient is taking narcotics  for chronic abdominal pain from the previous complicated hernia repair. This occasionally bleeds but not large volumes. Patient reported black, reddish soft stool on Monday and Tuesday but subsequent stools Wednesday and today are greenish. This is pretty much the same picture is when she has had bleeding in the past. She has chronic abdominal pain at the site of her complicated ventral hernia repair earlier this year Hemoglobin measured 7.42 days ago. Her baseline hemoglobin is variable but it was 11.7 in 11/2014. MCV has gone from 91 then 283 2 days ago. PT/INR are normal. Renal function is normal. Iron studies show a drop of ferritin from 40 in July to 7 now. Iron also has dropped from 70 down to 14 during the same interval.  Family history of cirrhosis of liver and her mother who may also have had hepatitis C.  The patient underwent an enteroscopy were 2 nonbleeding duodenal AVMs were identified and cauterized.  Over the past days she's had 5 bloody bowel movements.  She denies abdominal pain.       Past Medical History  Diagnosis Date  .  Hypertension   . Colon polyps   . Stomach problems   . Hypokalemic alkalosis   . Allergic rhinitis   . GERD (gastroesophageal reflux disease)   . Stroke [facility]) 2010    no residual problems  . Anemia, iron deficiency 05/03/2011    recieves iron infusions as well as periodic red blood cell transfusions  . GI bleeding     Has had bleeding from small bowel AVMs as well as from colon diverticulosis.  . Diverticulosis   . Difficulty sleeping     takes trazadone for sleep  . Peripheral vascular disease ([place])   . Hepatitis C     Began treatment with heart bony in late summer 2016.  Marland Kitchen Anxiety   . Shortness of breath     due to anemia  . Diabetes mellitus without complication [facility])    Past Surgical History  Procedure Laterality Date  . Leg amputation above knee    . Esophagogastroduodenoscopy  05/05/2011    Procedure: ESOPHAGOGASTRODUODENOSCOPY  (EGD);  Surgeon: Zenovia Jarred, MD;  Location: [facility];  Service: Gastroenterology;  Laterality: N/A;  Dr. Hilarie Fredrickson will do procedure for Dr. Benson Norway Saturday.  . Esophagogastroduodenoscopy  05/08/2011    Procedure: ESOPHAGOGASTRODUODENOSCOPY (EGD);  Surgeon: Beryle Beams;  Location: [facility];  Service: Endoscopy;  Laterality: N/A;  . Esophagogastroduodenoscopy  06/07/2011    Procedure: ESOPHAGOGASTRODUODENOSCOPY (EGD);  Surgeon: Beryle Beams, MD;  Location: [facility];  Service: Endoscopy;  Laterality: N/A;  . Hot hemostasis  06/07/2011    Procedure: HOT HEMOSTASIS (ARGON PLASMA COAGULATION/BICAP);  Surgeon: Beryle Beams, MD;  Location: [facility];  Service: Endoscopy;  Laterality: N/A;  . Esophagogastroduodenoscopy  12/20/2011    Procedure: ESOPHAGOGASTRODUODENOSCOPY (EGD);  Surgeon: Beryle Beams, MD;  Location: [facility];  Service: Endoscopy;  Laterality: N/A;  . Flexible sigmoidoscopy  12/21/2011    Procedure: FLEXIBLE SIGMOIDOSCOPY;  Surgeon: Beryle Beams, MD;  Location: [facility];  Service: Endoscopy;  Laterality: N/A;  . Carpal tunnel release      rt hand  . Partial colectomy  02/15/2012    Procedure: PARTIAL COLECTOMY;  Surgeon: Harl Bowie, MD;  Location: [facility];  Service: General;  Laterality: N/A;  . Laparotomy  02/16/2012    Procedure: EXPLORATORY LAPAROTOMY;  Surgeon: Edward Jolly, MD;  Location: [facility];  Service: General;  Laterality: N/A;  oversewing of anastomotic leak and rigid sigmoidoscopy  . Enteroscopy N/A 12/04/2012    Procedure: ENTEROSCOPY;  Surgeon: Beryle Beams, MD;  Location: [facility];  Service: Endoscopy;  Laterality: N/A;  . Enteroscopy N/A 12/18/2012    Procedure: ENTEROSCOPY;  Surgeon: Beryle Beams, MD;  Location: [facility];  Service: Endoscopy;  Laterality: N/A;  . Eye surgery Bilateral     cataracts  . Tonsillectomy    . Abdominal hysterectomy    . Insertion of mesh N/A 06/01/2014    Procedure: INSERTION OF MESH;  Surgeon:  Coralie Keens, MD;  Location: [place];  Service: General;  Laterality: N/A;  . Incisional hernia repair N/A 06/01/2014    Procedure: ATTEMPTED LAPAROSCOPIC AND OPEN INCISIONAL HERNIA REPAIR WITH MESH;  Surgeon: Coralie Keens, MD;  Location: [place];  Service: General;  Laterality: N/A;  . Laparotomy N/A 06/21/2014    Procedure: ABDOMINAL WOUND EXPLORATION;  Surgeon: Coralie Keens, MD;  Location: [place];  Service: General;  Laterality: N/A;  . Application of wound vac N/A 06/21/2014    Procedure: APPLICATION OF WOUND VAC;  Surgeon: Coralie Keens, MD;  Location: [facility];  Service: General;  Laterality: N/A;  . Enteroscopy N/A 02/25/2015    Procedure: ENTEROSCOPY;  Surgeon: Inda Castle, MD;  Location: [place];  Service: Endoscopy;  Laterality: N/A;   family history includes Cancer in her father; Diverticulitis in her mother; Hypertension in her mother. Current Outpatient Prescriptions  Medication Sig Dispense Refill  . ACCU-CHEK FASTCLIX LANCETS MISC Check your blood sugar one time a day 102 each 5  . amLODipine (NORVASC) 10 MG tablet Take 1 tablet (10 mg total) by mouth daily. 30 tablet 3  . aspirin EC 81 MG tablet Take 81 mg by mouth 2 (two) times daily.     Marland Kitchen atenolol (TENORMIN) 50 MG tablet TAKE 1 TABLET BY MOUTH EVERY MORNING 30 tablet 9  . cetirizine (ZYRTEC) 10 MG tablet Take 10 mg by mouth every morning.     Marland Kitchen FLUoxetine (PROZAC) 40 MG capsule TAKE 1 CAPSULE BY MOUTH DAILY 30 capsule 5  . gabapentin (NEURONTIN) 400 MG capsule TAKE 2 CAPSULE BY MOUTH THREE TIMES A DAY 180 capsule 3  . glucose blood (ACCU-CHEK SMARTVIEW) test strip Check blood sugar  one time a day 50 each 12  . hydrochlorothiazide (HYDRODIURIL) 25 MG tablet Take 1 tablet (25 mg total) by mouth every morning. 90 tablet 3  . Ledipasvir-Sofosbuvir (HARVONI) 90-400 MG TABS Take 1 tablet by mouth daily. 28 tablet 2  . lisinopril (PRINIVIL,ZESTRIL) 40 MG tablet Take 1 tablet (40 mg total) by mouth every morning. 90  tablet 4  . omeprazole (PRILOSEC) 20 MG capsule Take 1 capsule (20 mg total) by mouth daily. 30 capsule 11  . oxyCODONE-acetaminophen (PERCOCET) 10-325 MG tablet Take 1 tablet by mouth every 6 (six) hours as needed for pain. 120 tablet 0  . potassium chloride 20 MEQ/15ML (10%) SOLN TAKE 15ML BY MOUTH EVERY DAY (Patient taking differently: 15 mEq. TAKE 15ML BY MOUTH EVERY DAY) 450 mL 1  . traZODone (DESYREL) 100 MG tablet TAKE 1 TABLET BY MOUTH EVERY NIGHT AT BEDTIME 30 tablet 5  . lactose free nutrition (BOOST PLUS) LIQD Take 237 mLs by mouth 3 (three) times daily with meals. (Patient not taking: Reported on 03/02/2015) 237 mL 0  . Nutritional Supplements (ENSURE ACTIVE HIGH PROTEIN) LIQD Take 1 Bottle by mouth 4 (four) times daily -  with meals and at bedtime. (Patient not taking: Reported on 03/02/2015) 237 mL 12  . Specialty Vitamins Products (MAGNESIUM, AMINO ACID CHELATE,) 133 MG tablet Take 1 tablet by mouth 2 (two) times daily.    . vitamin C (ASCORBIC ACID) 500 MG tablet Take 500 mg by mouth every morning.     No current facility-administered medications for this visit.   Allergies as of 03/02/2015 - Review Complete 03/02/2015  Allergen Reaction Noted  . Ciprofloxacin Hives and Swelling 12/25/2011  . Morphine and related Other (See Comments) 12/25/2011  . Penicillins Hives and Swelling   . Codeine Hives and Swelling   . Feraheme [ferumoxytol] Itching 11/17/2012    reports that she has been smoking Cigarettes.  She has a 3.4 pack-year smoking history. She has never used smokeless tobacco. She reports that she drinks alcohol. She reports that she does not use illicit drugs.   Review of Systems: Pertinent positive and negative review of systems were noted in the above HPI section. All other review of systems were otherwise negative.  Vital signs were reviewed in today's medical record Physical Exam: General: Well developed , well nourished, no acute distress Skin: anicteric Head:  Normocephalic and atraumatic Eyes:  sclerae anicteric, EOMI Ears: Normal auditory acuity Mouth: No deformity or lesions Neck: Supple, no masses or thyromegaly Lymph Nodes: no lymphadenopathy Lungs: Clear throughout to auscultation Heart: Regular rate and rhythm; no murmurs, rubs or bruits Gastroinestinal:Rectal:deferred Musculoskeletal: Symmetrical with no gross deformities  Skin: No lesions on visible extremities Pulses:  Normal pulses noted Extremities: No clubbing, cyanosis, edema or deformities noted Neurological: Alert oriented x 4, grossly nonfocal Cervical Nodes:  No significant cervical adenopathy Inguinal Nodes: No significant inguinal adenopathy Psychological:  Alert and cooperative. Normal mood and affect  See Assessment and Plan under Problem List

## 2015-03-02 NOTE — Assessment & Plan Note (Signed)
Patient is having recurrent GI bleeding, presumably secondary to AVMs.  Plan to readmit to hospital for further evaluation and therapy.

## 2015-03-02 NOTE — Telephone Encounter (Signed)
I have left message for the patient to call back. Her appointment is at 2 pm.

## 2015-03-02 NOTE — H&P (Signed)
Date: 03/02/2015               Patient Name:  [name] MRN: [id number]  DOB: February 10, 1951 Age / Sex: 64 y.o., female   PCP: [name], MD         Medical Service: Internal Medicine Teaching Service         Attending Physician: Dr. Sid Falcon, MD    First Contact: Dr. Burgess Estelle Pager: [phone]  Second Contact: Dr. Osa Craver Pager: [phone]       After Hours (After 5p/  First Contact Pager: [phone]  weekends / holidays): Second Contact Pager: [phone]   Chief Complaint: "I had bright red blood and black tarry stools."  History of Present Illness: Ms. Hacker is a 64 year old African American lady with a history of long-standing anemia secondary to gastrointestinal bleeds from angiodysplasia, gastroesophageal reflux disease, hepatitis C being treating by Harvoni, and umbilical hernia repair in January 0737 complicated by poor wound healing. She presents as a direct admission to the hospital today from the Gastroenterology clinic after a two day history of bright red blood per rectum and black, tarry stools. A complete blood count in the clinic showed a hemoglobin of 7, compared to 11.2 just one day prior. She was actually discharged from the hospital 4 days ago for another gastrointestinal bleed; at that time, endoscopy showed two duodenal ateriovenous malformations which were cauterized. She also undergoes ferraheme transfusions every 3-4 months at [facility]. She denies lightheadedness, chest pain, or other concerns, and is otherwise feeling quite well.  Meds: Current Facility-Administered Medications  Medication Dose Route Frequency Provider Last Rate Last Dose  . 0.9 %  sodium chloride infusion   Intravenous Once Tasrif Ahmed, MD      . amLODipine (NORVASC) tablet 10 mg  10 mg Oral Daily Jones Bales, MD      . Derrill Memo ON 03/03/2015] atenolol (TENORMIN) tablet 50 mg  50 mg Oral q morning - 10a Jones Bales, MD      . feeding supplement  (ENSURE ENLIVE) (ENSURE ENLIVE) liquid 237 mL  1 Bottle Oral TID WC & HS Jones Bales, MD      . FLUoxetine (PROZAC) capsule 40 mg  40 mg Oral Daily Jones Bales, MD      . Derrill Memo ON 03/03/2015] hydrochlorothiazide (HYDRODIURIL) tablet 25 mg  25 mg Oral q morning - 10a Jones Bales, MD      . Derrill Memo ON 03/03/2015] lactose free nutrition (BOOST PLUS) liquid 237 mL  237 mL Oral TID WC Jones Bales, MD      . Ledipasvir-Sofosbuvir 90-400 MG TABS 1 tablet  1 tablet Oral Daily Jones Bales, MD      . Derrill Memo ON 03/03/2015] lisinopril (PRINIVIL,ZESTRIL) tablet 40 mg  40 mg Oral q morning - 10a Jones Bales, MD      . loratadine (CLARITIN) tablet 10 mg  10 mg Oral Daily Jones Bales, MD      . magnesium (amino acid chelate) tablet 1 tablet  1 tablet Oral BID Jones Bales, MD      . oxyCODONE-acetaminophen (PERCOCET) 10-325 MG per tablet 1 tablet  1 tablet Oral Q6H PRN Jones Bales, MD      . pantoprazole (PROTONIX) injection 40 mg  40 mg Intravenous Q12H Tasrif Ahmed, MD      . traZODone (DESYREL) tablet 100 mg  100 mg Oral QHS Jones Bales,  MD        Allergies: Allergies as of 03/02/2015 - Review Complete 03/02/2015  Allergen Reaction Noted  . Ciprofloxacin Hives and Swelling 12/25/2011  . Morphine and related Other (See Comments) 12/25/2011  . Penicillins Hives and Swelling   . Codeine Hives and Swelling   . Feraheme [ferumoxytol] Itching 11/17/2012   Past Medical History  Diagnosis Date  . Hypertension   . Colon polyps   . Stomach problems   . Hypokalemic alkalosis   . Allergic rhinitis   . GERD (gastroesophageal reflux disease)   . Stroke [facility]) 2010    no residual problems  . Anemia, iron deficiency 05/03/2011    recieves iron infusions as well as periodic red blood cell transfusions  . GI bleeding     Has had bleeding from small bowel AVMs as well as from colon diverticulosis.  . Diverticulosis   . Difficulty sleeping     takes trazadone for  sleep  . Peripheral vascular disease ([place])   . Hepatitis C     Began treatment with heart bony in late summer 2016.  Marland Kitchen Anxiety   . Shortness of breath     due to anemia  . Diabetes mellitus without complication [facility])    Past Surgical History  Procedure Laterality Date  . Leg amputation above knee    . Esophagogastroduodenoscopy  05/05/2011    Procedure: ESOPHAGOGASTRODUODENOSCOPY (EGD);  Surgeon: Zenovia Jarred, MD;  Location: [facility];  Service: Gastroenterology;  Laterality: N/A;  Dr. Hilarie Fredrickson will do procedure for Dr. Benson Norway Saturday.  . Esophagogastroduodenoscopy  05/08/2011    Procedure: ESOPHAGOGASTRODUODENOSCOPY (EGD);  Surgeon: Beryle Beams;  Location: [facility];  Service: Endoscopy;  Laterality: N/A;  . Esophagogastroduodenoscopy  06/07/2011    Procedure: ESOPHAGOGASTRODUODENOSCOPY (EGD);  Surgeon: Beryle Beams, MD;  Location: [facility];  Service: Endoscopy;  Laterality: N/A;  . Hot hemostasis  06/07/2011    Procedure: HOT HEMOSTASIS (ARGON PLASMA COAGULATION/BICAP);  Surgeon: Beryle Beams, MD;  Location: [facility];  Service: Endoscopy;  Laterality: N/A;  . Esophagogastroduodenoscopy  12/20/2011    Procedure: ESOPHAGOGASTRODUODENOSCOPY (EGD);  Surgeon: Beryle Beams, MD;  Location: [facility];  Service: Endoscopy;  Laterality: N/A;  . Flexible sigmoidoscopy  12/21/2011    Procedure: FLEXIBLE SIGMOIDOSCOPY;  Surgeon: Beryle Beams, MD;  Location: [facility];  Service: Endoscopy;  Laterality: N/A;  . Carpal tunnel release      rt hand  . Partial colectomy  02/15/2012    Procedure: PARTIAL COLECTOMY;  Surgeon: Harl Bowie, MD;  Location: [facility];  Service: General;  Laterality: N/A;  . Laparotomy  02/16/2012    Procedure: EXPLORATORY LAPAROTOMY;  Surgeon: Edward Jolly, MD;  Location: [facility];  Service: General;  Laterality: N/A;  oversewing of anastomotic leak and rigid sigmoidoscopy  . Enteroscopy N/A 12/04/2012    Procedure: ENTEROSCOPY;  Surgeon: Beryle Beams, MD;  Location: [facility];  Service: Endoscopy;  Laterality: N/A;  . Enteroscopy N/A 12/18/2012    Procedure: ENTEROSCOPY;  Surgeon: Beryle Beams, MD;  Location: [facility];  Service: Endoscopy;  Laterality: N/A;  . Eye surgery Bilateral     cataracts  . Tonsillectomy    . Abdominal hysterectomy    . Insertion of mesh N/A 06/01/2014    Procedure: INSERTION OF MESH;  Surgeon: Coralie Keens, MD;  Location: [place];  Service: General;  Laterality: N/A;  . Incisional hernia repair N/A 06/01/2014    Procedure: ATTEMPTED LAPAROSCOPIC AND OPEN INCISIONAL HERNIA  REPAIR WITH MESH;  Surgeon: Coralie Keens, MD;  Location: [place];  Service: General;  Laterality: N/A;  . Laparotomy N/A 06/21/2014    Procedure: ABDOMINAL WOUND EXPLORATION;  Surgeon: Coralie Keens, MD;  Location: [place];  Service: General;  Laterality: N/A;  . Application of wound vac N/A 06/21/2014    Procedure: APPLICATION OF WOUND VAC;  Surgeon: Coralie Keens, MD;  Location: [place];  Service: General;  Laterality: N/A;  . Enteroscopy N/A 02/25/2015    Procedure: ENTEROSCOPY;  Surgeon: Inda Castle, MD;  Location: [place];  Service: Endoscopy;  Laterality: N/A;   Family History  Problem Relation Age of Onset  . Cancer Father     unknown  . Hypertension Mother   . Diverticulitis Mother    Social History   Social History  . Marital Status: Widowed    Spouse Name: N/A  . Number of Children: 3  . Years of Education: N/A   Occupational History  . Retired    Social History Main Topics  . Smoking status: Current Every Day Smoker -- 0.20 packs/day for 17 years    Types: Cigarettes  . Smokeless tobacco: Never Used     Comment: wants to quit.  Smoking less 1 pk per week.  1-2 cigs per day  . Alcohol Use: 0.0 oz/week    0 Standard drinks or equivalent per week     Comment: Occasionally  . Drug Use: No     Comment: Thanksgiving and Christmas  Holidays 2014 hasn't had any since  . Sexual Activity: Yes    Birth  Control/ Protection: Post-menopausal   Other Topics Concern  . Not on file   Social History Narrative    Review of Systems  Constitutional: Negative for fever, chills, weight loss and malaise/fatigue.  Eyes: Negative for blurred vision and double vision.  Respiratory: Negative for cough and sputum production.   Cardiovascular: Negative for chest pain, palpitations and orthopnea.  Gastrointestinal: Positive for blood in stool and melena. Negative for heartburn, nausea, vomiting and abdominal pain.  Genitourinary: Negative for dysuria and urgency.  Musculoskeletal: Negative for myalgias.  Skin: Negative for rash.  Neurological: Negative for dizziness, loss of consciousness and headaches.   Physical Exam: Blood pressure 127/75, pulse 83, temperature 98.6 F (37 C), temperature source Oral, resp. rate 18, weight 74.14 kg (163 lb 7.2 oz), SpO2 99 %. Physical Exam  Constitutional: She appears well-developed and well-nourished.  HENT:  Head: Normocephalic and atraumatic.  Eyes: Conjunctivae and EOM are normal. Pupils are equal, round, and reactive to light.  Neck: Normal range of motion. Neck supple.  Cardiovascular: Normal rate, regular rhythm, normal heart sounds and intact distal pulses.   No murmur heard. Pulmonary/Chest: Effort normal and breath sounds normal.  Abdominal: Soft. Bowel sounds are normal. She exhibits no distension. There is no tenderness.  She has an open ulcer over her umbilicus without drainage from her hernia repair; there are surrounding contractures from scarring.  Musculoskeletal: Normal range of motion.  Neurological: She is alert.  Skin: Skin is warm and dry.  Psychiatric: She has a normal mood and affect. Her behavior is normal.   Lab results: Basic Metabolic Panel:  Recent Labs  03/02/15 1830  NA 136  K 3.3*  CL 102  CO2 25  GLUCOSE 140*  BUN 30*  CREATININE 0.97  CALCIUM 8.4*   CBC:  Recent Labs  03/01/15 1015 03/02/15 1830  WBC 9.2  12.8*  HGB 11.2* 7.0*  HCT 34.7* 22.4*  MCV 81.3 85.2  PLT 341 304   CBG:  Recent Labs  03/02/15 1619  GLUCAP 163*   Assessment & Plan by Problem: Ms. Balik is a very friendly 64 year old African American lady with a history of recurrent gastrointestinal bleeds from duodenal angiodysplasia presenting with bright red blood per rectum and melena, a 5 point drop in her hemoglobin over one day, and a positive fecal occult test, which I strongly suspect is from yet another brisk upper gastrointestinal bleed. Fortunately she is hemodynamically stable and asymptomatic. We transfused her with two units of red blood cells. Given her concomitant poor wound healing and recurrent angiodysplasias, I questioned whether she has a congenital connective tissue disease such as Drue Dun but she does not exhibits joint laxity nor kyphosis on exam. Nor does she have an aortic stenosis murmurs suggestive of Heyde's syndrome. Gastroenterology will evaluate her tomorrow for the next step in management.  Brisk upper gastrointestinal bleed: Per above, I suspect this is from recurrent duodenal angiodysplasia bleed. -She was transfused with two units of packed red blood cells -Pantoprazole 40mg  daily; we will not do twice daily dosing because this interferes with Harvoni absorption, per Pharmacy -Holding aspirin -Gastroenterology will see her tomorrow; we appreciate your help  Umbilical hernia repair wound: This does not appear to be actively infected. -Consulted wound care team  Hypertension: She is normotensive so we will continue her home medications. -Continue HCTZ 25mg  -Continue amlodipine 10mg  -Continue lisinopril 10mg   Major depressive disorder: She is pleasant and not depressed this admission. -Continue fluoxetine 40mg   Hepatitis C: Her last HCV viral load was undetectable. We will continue her Harvoni -Continue Harvoni  Dispo: Disposition is deferred at this time, awaiting improvement of  current medical problems.  The patient does have a current PCP ([name], MD) and does need an [facility] hospital follow-up appointment after discharge.  The patient does not know have transportation limitations that hinder transportation to clinic appointments.  Signed: Loleta Chance, MD 03/02/2015, 9:12 PM

## 2015-03-03 ENCOUNTER — Encounter (HOSPITAL_COMMUNITY): Payer: Self-pay | Admitting: Physician Assistant

## 2015-03-03 DIAGNOSIS — K219 Gastro-esophageal reflux disease without esophagitis: Secondary | ICD-10-CM

## 2015-03-03 DIAGNOSIS — E873 Alkalosis: Secondary | ICD-10-CM | POA: Diagnosis not present

## 2015-03-03 DIAGNOSIS — K922 Gastrointestinal hemorrhage, unspecified: Secondary | ICD-10-CM | POA: Diagnosis not present

## 2015-03-03 DIAGNOSIS — K31811 Angiodysplasia of stomach and duodenum with bleeding: Secondary | ICD-10-CM

## 2015-03-03 DIAGNOSIS — R630 Anorexia: Secondary | ICD-10-CM | POA: Diagnosis not present

## 2015-03-03 DIAGNOSIS — F329 Major depressive disorder, single episode, unspecified: Secondary | ICD-10-CM | POA: Diagnosis not present

## 2015-03-03 DIAGNOSIS — K921 Melena: Secondary | ICD-10-CM | POA: Diagnosis not present

## 2015-03-03 DIAGNOSIS — I10 Essential (primary) hypertension: Secondary | ICD-10-CM | POA: Diagnosis not present

## 2015-03-03 DIAGNOSIS — B182 Chronic viral hepatitis C: Secondary | ICD-10-CM

## 2015-03-03 DIAGNOSIS — T8189XS Other complications of procedures, not elsewhere classified, sequela: Secondary | ICD-10-CM | POA: Diagnosis not present

## 2015-03-03 DIAGNOSIS — D500 Iron deficiency anemia secondary to blood loss (chronic): Secondary | ICD-10-CM

## 2015-03-03 LAB — CBC
HEMATOCRIT: 28.6 % — AB (ref 36.0–46.0)
HEMOGLOBIN: 9.4 g/dL — AB (ref 12.0–15.0)
MCH: 28.7 pg (ref 26.0–34.0)
MCHC: 32.9 g/dL (ref 30.0–36.0)
MCV: 87.5 fL (ref 78.0–100.0)
Platelets: 257 10*3/uL (ref 150–400)
RBC: 3.27 MIL/uL — ABNORMAL LOW (ref 3.87–5.11)
RDW: 19.9 % — AB (ref 11.5–15.5)
WBC: 9 10*3/uL (ref 4.0–10.5)

## 2015-03-03 LAB — RAPID URINE DRUG SCREEN, HOSP PERFORMED
Amphetamines: NOT DETECTED
BARBITURATES: NOT DETECTED
Benzodiazepines: NOT DETECTED
COCAINE: POSITIVE — AB
Opiates: NOT DETECTED
TETRAHYDROCANNABINOL: NOT DETECTED

## 2015-03-03 LAB — GLUCOSE, CAPILLARY
GLUCOSE-CAPILLARY: 130 mg/dL — AB (ref 65–99)
GLUCOSE-CAPILLARY: 184 mg/dL — AB (ref 65–99)
Glucose-Capillary: 135 mg/dL — ABNORMAL HIGH (ref 65–99)
Glucose-Capillary: 129 mg/dL — ABNORMAL HIGH (ref 65–99)

## 2015-03-03 MED ORDER — TRAZODONE HCL 50 MG PO TABS
50.0000 mg | ORAL_TABLET | Freq: Every day | ORAL | Status: DC
  Administered 2015-03-03: 50 mg via ORAL
  Filled 2015-03-03: qty 1

## 2015-03-03 MED ORDER — ASPIRIN EC 81 MG PO TBEC
81.0000 mg | DELAYED_RELEASE_TABLET | Freq: Every day | ORAL | Status: DC
  Administered 2015-03-03 – 2015-03-04 (×2): 81 mg via ORAL
  Filled 2015-03-03: qty 1

## 2015-03-03 MED ORDER — ENSURE ENLIVE PO LIQD
1.0000 | Freq: Two times a day (BID) | ORAL | Status: DC
  Administered 2015-03-03 – 2015-03-04 (×3): 237 mL via ORAL

## 2015-03-03 MED ORDER — GABAPENTIN 400 MG PO CAPS
800.0000 mg | ORAL_CAPSULE | Freq: Three times a day (TID) | ORAL | Status: DC
  Administered 2015-03-03 – 2015-03-04 (×6): 800 mg via ORAL
  Filled 2015-03-03: qty 2
  Filled 2015-03-03: qty 8
  Filled 2015-03-03 (×6): qty 2

## 2015-03-03 NOTE — Consult Note (Addendum)
WOC wound consult note Reason for Consult: Consult requested for abd chronic wound.  Pt had hernia repair with mesh in 1/16; and is followed as an outpatient by Dr Ninfa Linden.  He has requested that she leave abd wound open to air without any dressings or topical treatment.  Measurement: Scar tissue to middle abd; affected area is 3.5X2cm.  There are 2 small open wounds inside of the scar tissue which have never healed completely, according to the bedside nurse.  Each is approx .1X.1X.1cm, no odor or drainage, red moist wound bed. Dressing procedure/placement/frequency: Leave open to air as requested.  Pt states she will follow-up with Dr Ninfa Linden after discharge. Please re-consult if further assistance is needed.  Thank-you,  Patricia Girt MSN, [place], [place], [place], [place]

## 2015-03-03 NOTE — Progress Notes (Signed)
Initial Nutrition Assessment  DOCUMENTATION CODES:   Not applicable  INTERVENTION:   Provide Ensure Enlive po BID, each supplement provides 350 kcal and 20 grams of protein.  Encourage adequate PO intake.   NUTRITION DIAGNOSIS:   Increased nutrient needs related to wound healing as evidenced by estimated needs.  GOAL:   Patient will meet greater than or equal to 90% of their needs  MONITOR:   PO intake, Weight trends, Supplement acceptance, Labs, I & O's, Skin  REASON FOR ASSESSMENT:   Malnutrition Screening Tool    ASSESSMENT:   64 year old African American lady with a history of long-standing anemia secondary to gastrointestinal bleeds from angiodysplasia, gastroesophageal reflux disease, hepatitis C being treating by Harvoni, and umbilical hernia repair in January 4268 complicated by poor wound healing. She presents as a direct admission to the hospital today from the Gastroenterology clinic after a two day history of bright red blood per rectum and black, tarry stools.  Pt reports having a good appetite currently. Meal completion has been 100%. Pt does report having a decreased appetite PTA with consuming of only 1 meal a day and occasional snacks which has been ongoing over the past couple of months. Pt with no significant weight loss. Pt currently has Ensure, Boost Breeze, and Boost ordered. RD to modify orders.  Pt with no observed significant fat or muscle mass loss. Noted pt with a AKA.   Labs and medications reviewed.   Diet Order:  Diet Carb Modified Fluid consistency:: Thin; Room service appropriate?: Yes  Skin:  Wound (see comment) (Open incision abdomen wound)  Last BM:  10/1  Height:   Ht Readings from Last 1 Encounters:  03/02/15 5\' 3"  (1.6 m)    Weight:   Wt Readings from Last 1 Encounters:  03/02/15 163 lb 7.2 oz (74.14 kg)    Ideal Body Weight:  52.27 kg  BMI:  Body mass index is 28.96 kg/(m^2).  Estimated Nutritional Needs:   Kcal:   1700-2000  Protein:  85-95 grams  Fluid:  1.7 - 2 L/day  EDUCATION NEEDS:   No education needs identified at this time  Corrin Parker, MS, RD, LDN Pager # [phone] After hours/ weekend pager # [phone]

## 2015-03-03 NOTE — Progress Notes (Signed)
Subjective:  Patient seen and examined at bedside. Patient denied any bowel movements, nausea or vomiting , or lightheadedness since being admitted to the hospital.   Per admitting H&P and morning round, she denied vomiting and lightheadedness, however during the team round, she later said that she did have an episode of bright red vomiting yesterday morning, and was feeling very lightheaded yesterday.  Since being admitted, she has been given 2 units of PRBCs and her HgB is now 9.4.  In the prior admission, she had received 2 PRBS and a unit of ferraheme.  Objective: Vital signs in last 24 hours: Filed Vitals:   03/03/15 0136 03/03/15 0343 03/03/15 0448 03/03/15 0800  BP: 132/69 105/55 113/65 110/59  Pulse: 76 74 75 82  Temp: 98.9 F (37.2 C) 99 F (37.2 C) 97.8 F (36.6 C) 98.2 F (36.8 C)  TempSrc: Oral Oral Oral Oral  Resp: 18 19 20 17   Height:      Weight:      SpO2: 100% 100% 96% 98%   Weight change:   Intake/Output Summary (Last 24 hours) at 03/03/15 1105 Last data filed at 03/03/15 1031  Gross per 24 hour  Intake    930 ml  Output   1200 ml  Net   -270 ml   General: Vital signs reviewed. Patient in no acute distress HEENT: mild conj pallor Cardiovascular: regular rate, rhythm, no murmur appreciated  Pulmonary/Chest: Clear to auscultation bilaterally, mild wheezing heard b/l Abdominal: Soft, non-tender, non-distended, BS +, has an open would on her umbilicus from the hernia repair in Jan, with scarring, is c/d/i, no drainage noted Extremities: No lower extremity edema bilaterally, pulses symmetric and intact bilaterally. Skin: Warm and well perfused  Lab Results: Results for orders placed or performed during the hospital encounter of 03/02/15 (from the past 24 hour(s))  Glucose, capillary     Status: Abnormal   Collection Time: 03/02/15  4:19 PM  Result Value Ref Range   Glucose-Capillary 163 (H) 65 - 99 mg/dL  CBC     Status: Abnormal   Collection Time:  03/02/15  6:30 PM  Result Value Ref Range   WBC 12.8 (H) 4.0 - 10.5 K/uL   RBC 2.63 (L) 3.87 - 5.11 MIL/uL   Hemoglobin 7.0 (L) 12.0 - 15.0 g/dL   HCT 22.4 (L) 36.0 - 46.0 %   MCV 85.2 78.0 - 100.0 fL   MCH 26.6 26.0 - 34.0 pg   MCHC 31.3 30.0 - 36.0 g/dL   RDW 22.2 (H) 11.5 - 15.5 %   Platelets 304 150 - 400 K/uL  Basic metabolic panel     Status: Abnormal   Collection Time: 03/02/15  6:30 PM  Result Value Ref Range   Sodium 136 135 - 145 mmol/L   Potassium 3.3 (L) 3.5 - 5.1 mmol/L   Chloride 102 101 - 111 mmol/L   CO2 25 22 - 32 mmol/L   Glucose, Bld 140 (H) 65 - 99 mg/dL   BUN 30 (H) 6 - 20 mg/dL   Creatinine, Ser 0.97 0.44 - 1.00 mg/dL   Calcium 8.4 (L) 8.9 - 10.3 mg/dL   GFR calc non Af Amer >60 >60 mL/min   GFR calc Af Amer >60 >60 mL/min   Anion gap 9 5 - 15  Prepare RBC     Status: None   Collection Time: 03/02/15  8:00 PM  Result Value Ref Range   Order Confirmation ORDER PROCESSED BY BLOOD BANK   Type  and screen     Status: None (Preliminary result)   Collection Time: 03/02/15  8:35 PM  Result Value Ref Range   ABO/RH(D) AB POS    Antibody Screen NEG    Sample Expiration 03/05/2015    Unit Number [id number]    Blood Component Type RED CELLS,LR    Unit division 00    Status of Unit ISSUED,FINAL    Transfusion Status OK TO TRANSFUSE    Crossmatch Result Compatible    Unit Number [id number]    Blood Component Type RED CELLS,LR    Unit division 00    Status of Unit ISSUED    Transfusion Status OK TO TRANSFUSE    Crossmatch Result Compatible   Occult blood card to lab, stool Provider will collect     Status: Abnormal   Collection Time: 03/02/15  8:36 PM  Result Value Ref Range   Fecal Occult Bld POSITIVE (A) NEGATIVE  Glucose, capillary     Status: Abnormal   Collection Time: 03/02/15  9:04 PM  Result Value Ref Range   Glucose-Capillary 241 (H) 65 - 99 mg/dL  CBC     Status: Abnormal   Collection Time: 03/03/15  5:58 AM  Result Value Ref Range    WBC 9.0 4.0 - 10.5 K/uL   RBC 3.27 (L) 3.87 - 5.11 MIL/uL   Hemoglobin 9.4 (L) 12.0 - 15.0 g/dL   HCT 28.6 (L) 36.0 - 46.0 %   MCV 87.5 78.0 - 100.0 fL   MCH 28.7 26.0 - 34.0 pg   MCHC 32.9 30.0 - 36.0 g/dL   RDW 19.9 (H) 11.5 - 15.5 %   Platelets 257 150 - 400 K/uL  Glucose, capillary     Status: Abnormal   Collection Time: 03/03/15  7:59 AM  Result Value Ref Range   Glucose-Capillary 130 (H) 65 - 99 mg/dL    Micro Results: Recent Results (from the past 240 hour(s))  MRSA PCR Screening     Status: None   Collection Time: 02/23/15  1:09 PM  Result Value Ref Range Status   MRSA by PCR NEGATIVE NEGATIVE Final    Comment:        The GeneXpert MRSA Assay (FDA approved for NASAL specimens only), is one component of a comprehensive MRSA colonization surveillance program. It is not intended to diagnose MRSA infection nor to guide or monitor treatment for MRSA infections.    Studies/Results: No results found. Medications: I have reviewed the patient's current medications. Scheduled Meds: . amLODipine  10 mg Oral Daily  . aspirin EC  81 mg Oral Daily  . atenolol  50 mg Oral q morning - 10a  . feeding supplement  1 Container Oral TID BM  . feeding supplement (ENSURE ENLIVE)  1 Bottle Oral TID WC & HS  . FLUoxetine  40 mg Oral Daily  . gabapentin  800 mg Oral TID  . hydrochlorothiazide  25 mg Oral q morning - 10a  . lactose free nutrition  237 mL Oral TID WC  . Ledipasvir-Sofosbuvir  1 tablet Oral Daily  . lisinopril  40 mg Oral q morning - 10a  . loratadine  10 mg Oral Daily  . pantoprazole (PROTONIX) IV  40 mg Intravenous QHS  . traZODone  50 mg Oral QHS   Continuous Infusions:  PRN Meds:.oxyCODONE-acetaminophen **AND** oxyCODONE Assessment/Plan:  64 y.o. woman with IDA and history of recurrent GI bleeds due to duodenal angiodysplasia and diverticulosis who presented directly from GI office with BRBPR, bloody  vomiting with an acute 5 point drop in Hgb from  3 days  earlier when she had undergone laser coagulation of AVms in [place] enteroscopy on 9/30 and had received 2 units of PRBC. Other PMH of HTN, NIDDM, HCV on Harvoni, and depression, and GERD.  GI Bleed: She had BRBPR and +FOBT , but she also had bloody vomiting, so it may be either upper or lower GI bleed. Given that she had duodenal AVMs coagulated few days ago, that maybe the source.  -received 2 units PRBC -Pantoprazole 40 mg IV daily -consulted GI, they plan to repeat the procedure(s) today- defer to their recommendations   Umbilical hernia wound: wound healing was poor -consulted wound care -likely conservative care and follow up with Dr Ninfa Linden  HTN: stable -continuing hctz, amlodipine, and lisinopril   Hep C: HCV VL undetectable -continue Harvoni    Dispo: Disposition is deferred at this time, awaiting improvement of current medical problems.  Anticipated discharge in approximately 2 day(s).   The patient does have a current PCP ([name], MD) and does need an [facility] hospital follow-up appointment after discharge.  The patient does not have transportation limitations that hinder transportation to clinic appointments.  .Services Needed at time of discharge: Y = Yes, Blank = No PT:   OT:   RN:   Equipment:   Other:     LOS: 1 day   Burgess Estelle, MD 03/03/2015, 11:05 AM

## 2015-03-03 NOTE — Consult Note (Signed)
[facility] Consult: 10:10 AM 03/03/2015  LOS: 1 day    Referring Provider: Dr Daryll Drown  Primary Care Physician:  [name], MD Primary Gastroenterologist:  Dr. Deatra Ina     Reason for Consultation:  Painless hematochezia and recurrent ABL anemia.    HPI: [name] is a 64 y.o. female.  Hx HTN, NIDDM. S/P left AKA. Complicate ventral hernia repair 05/2014. Hx HCV without cirrhosis. Harvoni started early 12/2014 through end of October 2016 under the supervision of Dr. Linus Salmons. Liver biopsy in 2007 confirmed grade 1, stage 1 chronic hepatitis.  Not known to have cirrhosis.  Normal looking liver on 12/30/14 ultrasound with elastrography. Metavir fibrosis score with some F3 and F4 with high risk of fibrosis.  Chronic iron deficiency anemia treated with Feraheme transfusions at the cancer center every 3-4 months. Last infusion 12/23/14 and during recent admission ~ 9/30. Transfused 2 PRBCs 11/205, 05/2014 and 02/23/2015. Not on oral iron.    All previous endoscopy/colonoscopy performed by Dr Benson Norway but pt dismissed from his practice so pt seen 02/24/2015 as inpt consult by Dr Deatra Ina.  Innumerable EGDs. Previous colonoscopy, flexible sigmoidoscopy and at least a couple of capsule endoscopies. 12/2010 EGD (due to active bleeding on capsule study)  with endoclipping of duodenal AVMs. On subsequent EGDs in 2012 and 2013 studies were either normal or small hiatal hernia noted. Enteroscopies x 2 in 11/2012 revealed duodenal AVMs all of which were ablated, none of which were actively bleeding. A flexible sigmoidoscopy July 2013 due to painless hematochezia noted blood and diverticulosis in the left colon. Sigmoid polyp removed at colonoscopy in 2011.  S/p 01/2012 partial left colectomy because of recurrent lower GI bleeding.  Required relook laparotomy and oversewing of anastomotic leak post op day 1.  S/p 06/01/2014 open incisional hernia repair with mesh.  S/p 06/21/14 wound exploration and wound vac placement.  On narcotics for chronic post op abdominal pain.  Enteroscopy 02/25/15 per Dr Deatra Ina: 2 arteriovenous malformations measuring 89mm in size were found in the duodenal bulb; Argon plasma coagulation was applied to the sites; with good treatment effect with complete hemostasis achieved.  During the 9/28 - 02/26/15 admission for recurrent anemia (ferritin 7), she received 2 PRBCs and parenteral iron. Hgb 9.1 on 10/1. Oral iron not on med list at discharge.  Hgb 11.2 on 10/4.   Pt having brown BMs near time of discharge on Saturday.  Brown stools earlier this week.  Appetite great.  Painless bloody stools started Tuesday PM 10/4.   These were decidedly different from the maroon/dark stools last week.  By the AM of 10/5 the stools had become black and she was getting dizzy and weak.  Kept appt with Dr Deatra Ina and he ent to ED and was admitted. Black FOBT + stool in ED.  Hgb 7.0.  Hgb up to 9.4 post PRBC x 2.     Past Medical History  Diagnosis Date  . Hypertension   . Colon polyps   . Stomach problems   . Hypokalemic alkalosis   . Allergic rhinitis   .  GERD (gastroesophageal reflux disease)   . Stroke [facility]) 2010    no residual problems  . Anemia, iron deficiency 05/03/2011    recieves iron infusions as well as periodic red blood cell transfusions  . GI bleeding     Has had bleeding from small bowel AVMs as well as from colon diverticulosis.  . Diverticulosis   . Difficulty sleeping     takes trazadone for sleep  . Peripheral vascular disease ([place])   . Hepatitis C     Began treatment with heart bony in late summer 2016.  Marland Kitchen Anxiety   . Shortness of breath     due to anemia  . Diabetes mellitus without complication [facility])     Past Surgical History  Procedure Laterality Date  . Leg amputation above knee      . Esophagogastroduodenoscopy  05/05/2011    Procedure: ESOPHAGOGASTRODUODENOSCOPY (EGD);  Surgeon: Zenovia Jarred, MD;  Location: [facility];  Service: Gastroenterology;  Laterality: N/A;  Dr. Hilarie Fredrickson will do procedure for Dr. Benson Norway Saturday.  . Esophagogastroduodenoscopy  05/08/2011    Procedure: ESOPHAGOGASTRODUODENOSCOPY (EGD);  Surgeon: Beryle Beams;  Location: [facility];  Service: Endoscopy;  Laterality: N/A;  . Esophagogastroduodenoscopy  06/07/2011    Procedure: ESOPHAGOGASTRODUODENOSCOPY (EGD);  Surgeon: Beryle Beams, MD;  Location: [facility];  Service: Endoscopy;  Laterality: N/A;  . Hot hemostasis  06/07/2011    Procedure: HOT HEMOSTASIS (ARGON PLASMA COAGULATION/BICAP);  Surgeon: Beryle Beams, MD;  Location: [facility];  Service: Endoscopy;  Laterality: N/A;  . Esophagogastroduodenoscopy  12/20/2011    Procedure: ESOPHAGOGASTRODUODENOSCOPY (EGD);  Surgeon: Beryle Beams, MD;  Location: [facility];  Service: Endoscopy;  Laterality: N/A;  . Flexible sigmoidoscopy  12/21/2011    Procedure: FLEXIBLE SIGMOIDOSCOPY;  Surgeon: Beryle Beams, MD;  Location: [facility];  Service: Endoscopy;  Laterality: N/A;  . Carpal tunnel release      rt hand  . Partial colectomy  02/15/2012    Procedure: PARTIAL COLECTOMY;  Surgeon: Harl Bowie, MD;  Location: [facility];  Service: General;  Laterality: N/A;  . Laparotomy  02/16/2012    Procedure: EXPLORATORY LAPAROTOMY;  Surgeon: Edward Jolly, MD;  Location: [facility];  Service: General;  Laterality: N/A;  oversewing of anastomotic leak and rigid sigmoidoscopy  . Enteroscopy N/A 12/04/2012    Procedure: ENTEROSCOPY;  Surgeon: Beryle Beams, MD;  Location: [facility];  Service: Endoscopy;  Laterality: N/A;  . Enteroscopy N/A 12/18/2012    Procedure: ENTEROSCOPY;  Surgeon: Beryle Beams, MD;  Location: [facility];  Service: Endoscopy;  Laterality: N/A;  . Eye surgery Bilateral     cataracts  . Tonsillectomy    . Abdominal  hysterectomy    . Insertion of mesh N/A 06/01/2014    Procedure: INSERTION OF MESH;  Surgeon: Coralie Keens, MD;  Location: [place];  Service: General;  Laterality: N/A;  . Incisional hernia repair N/A 06/01/2014    Procedure: ATTEMPTED LAPAROSCOPIC AND OPEN INCISIONAL HERNIA REPAIR WITH MESH;  Surgeon: Coralie Keens, MD;  Location: [place];  Service: General;  Laterality: N/A;  . Laparotomy N/A 06/21/2014    Procedure: ABDOMINAL WOUND EXPLORATION;  Surgeon: Coralie Keens, MD;  Location: [place];  Service: General;  Laterality: N/A;  . Application of wound vac N/A 06/21/2014    Procedure: APPLICATION OF WOUND VAC;  Surgeon: Coralie Keens, MD;  Location: [place];  Service: General;  Laterality: N/A;  . Enteroscopy N/A 02/25/2015    Procedure: ENTEROSCOPY;  Surgeon: Herbie Baltimore  Shaaron Adler, MD;  Location: [place];  Service: Endoscopy;  Laterality: N/A;    Prior to Admission medications   Medication Sig Start Date End Date Taking? Authorizing Provider  amLODipine (NORVASC) 10 MG tablet Take 1 tablet (10 mg total) by mouth daily. 01/28/15  Yes Marjan Rabbani, MD  aspirin EC 81 MG tablet Take 81 mg by mouth 2 (two) times daily.    Yes Historical Provider, MD  atenolol (TENORMIN) 50 MG tablet TAKE 1 TABLET BY MOUTH EVERY MORNING 11/04/14  Yes Jerrye Noble, MD  cetirizine (ZYRTEC) 10 MG tablet Take 10 mg by mouth every morning.    Yes Historical Provider, MD  FLUoxetine (PROZAC) 40 MG capsule TAKE 1 CAPSULE BY MOUTH DAILY 02/25/15  Yes Marjan Rabbani, MD  gabapentin (NEURONTIN) 400 MG capsule TAKE 2 CAPSULE BY MOUTH THREE TIMES A DAY 01/28/15  Yes Marjan Rabbani, MD  hydrochlorothiazide (HYDRODIURIL) 25 MG tablet Take 1 tablet (25 mg total) by mouth every morning. 10/22/14  Yes Jerrye Noble, MD  Ledipasvir-Sofosbuvir (HARVONI) 90-400 MG TABS Take 1 tablet by mouth daily. 12/29/14  Yes Thayer Headings, MD  lisinopril (PRINIVIL,ZESTRIL) 40 MG tablet Take 1 tablet (40 mg total) by mouth every morning. 10/22/14  Yes Jerrye Noble, MD  omeprazole (PRILOSEC) 20 MG capsule Take 1 capsule (20 mg total) by mouth daily. 12/29/14  Yes Thayer Headings, MD  oxyCODONE-acetaminophen (PERCOCET) 10-325 MG tablet Take 1 tablet by mouth every 6 (six) hours as needed for pain. 02/22/15  Yes Burgess Estelle, MD  potassium chloride 20 MEQ/15ML (10%) SOLN TAKE 15ML BY MOUTH EVERY DAY Patient taking differently: Take 20 mEq by mouth daily. TAKE 15ML BY MOUTH EVERY DAY 10/22/14  Yes Jerrye Noble, MD  traZODone (DESYREL) 100 MG tablet TAKE 1 TABLET BY MOUTH EVERY NIGHT AT BEDTIME 02/25/15  Yes Marjan Rabbani, MD  vitamin C (ASCORBIC ACID) 500 MG tablet Take 500 mg by mouth every morning.   Yes Historical Provider, MD    Scheduled Meds: . amLODipine  10 mg Oral Daily  . aspirin EC  81 mg Oral Daily  . atenolol  50 mg Oral q morning - 10a  . feeding supplement  1 Container Oral TID BM  . feeding supplement (ENSURE ENLIVE)  1 Bottle Oral TID WC & HS  . FLUoxetine  40 mg Oral Daily  . gabapentin  800 mg Oral TID  . hydrochlorothiazide  25 mg Oral q morning - 10a  . lactose free nutrition  237 mL Oral TID WC  . Ledipasvir-Sofosbuvir  1 tablet Oral Daily  . lisinopril  40 mg Oral q morning - 10a  . loratadine  10 mg Oral Daily  . pantoprazole (PROTONIX) IV  40 mg Intravenous QHS  . traZODone  50 mg Oral QHS   Infusions:   PRN Meds: oxyCODONE-acetaminophen **AND** oxyCODONE   Allergies as of 03/02/2015 - Review Complete 03/02/2015  Allergen Reaction Noted  . Ciprofloxacin Hives and Swelling 12/25/2011  . Morphine and related Other (See Comments) 12/25/2011  . Penicillins Hives and Swelling   . Codeine Hives and Swelling   . Feraheme [ferumoxytol] Itching 11/17/2012    Family History  Problem Relation Age of Onset  . Cancer Father     unknown  . Hypertension Mother   . Diverticulitis Mother     Mom has cirrhosis, and ? If she has Hep C.    Social History   Social History  . Marital Status: Widowed  Spouse Name: N/A    . Number of Children: 3  . Years of Education: N/A   Occupational History  . Retired    Social History Main Topics  . Smoking status: Current Every Day Smoker -- 0.20 packs/day for 17 years    Types: Cigarettes  . Smokeless tobacco: Never Used     Comment: wants to quit.  Smoking less 1 pk per week.  1-2 cigs per day  . Alcohol Use: 0.0 oz/week    0 Standard drinks or equivalent per week     Comment: Occasionally  . Drug Use: No     Comment: Thanksgiving and Christmas  Holidays 2014 hasn't had any since  . Sexual Activity: Yes    Birth Control/ Protection: Post-menopausal   Other Topics Concern  . Not on file   Social History Narrative   Constitutional: In the last 2 months she has gained 10 pounds. Her weight tends to fluctuate 5-10 pounds up or down with some regularity. ENT: No nose bleeds Pulm: Periodic shortness of breath and dyspnea on exertion. No productive cough. CV: No palpitations, no LE edema.  GU: No hematuria, no frequency. Positive for urinary incontinence GI: No nausea. Appetite is variable. Chronic abdominal pain as per HPI Heme: Per HPI.  Transfusions: Per HPI MS:  Walks with walker.  Neuro: No headaches, no peripheral tingling or numbness Derm: Chronic itching which started after her amputation. She scratches herself to the point that she causes skin eruptions and some minor bleeding.  Endocrine: No sweats or chills. No polyuria or dysuria Immunization: Immunized for hepatitis A and B 01/28/15. Flu shot performed last week. . Travel: None beyond local counties in last few months.     PHYSICAL EXAM: Vital signs in last 24 hours: Filed Vitals:   03/03/15 0800  BP: 110/59  Pulse: 82  Temp: 98.2 F (36.8 C)  Resp: 17   Wt Readings from Last 3 Encounters:  03/02/15 163 lb 7.2 oz (74.14 kg)  03/02/15 167 lb 12.8 oz (76.114 kg)  02/25/15 133 lb 13.1 oz (60.7 kg)   General: pleasant, comfortable, does not look ill Head:  No  swelling or asymmetry  Eyes:  No icterus or pallor Ears:  Not HOH  Nose:  No congestion or discharge Mouth:  Clear, moist MM Neck:  No mass or JVD Lungs:  Clear bil.  No SOB or cough. Heart: RRR.  No mrg.  S1/S2 audible Abdomen:  Soft, NT, ND, active BS.  No HSM or bruits.  Incision scar at midline intact but raw. .   Rectal: not repeated.    Musc/Skeltl: no joint swelling or contracture deformity Extremities:  No CCE.  Left AKA   Neurologic:  Oriented x 3.  Excellent historian.  No tremor, Skin:  Hyperpigmented scars on upper back and thighs Tattoos:  none Nodes:  No cervical adenopathy   Psych:  Pleasant, calm.  Cooperative.   Intake/Output from previous day: 10/05 0701 - 10/06 0700 In: 930 [P.O.:240; Blood:690] Out: 0  Intake/Output this shift:    LAB RESULTS:  Recent Labs  03/01/15 1015 03/02/15 1830 03/03/15 0558  WBC 9.2 12.8* 9.0  HGB 11.2* 7.0* 9.4*  HCT 34.7* 22.4* 28.6*  PLT 341 304 257   BMET Lab Results  Component Value Date   NA 136 03/02/2015   NA 136 02/24/2015   NA 135 02/23/2015   K 3.3* 03/02/2015   K 3.5 02/24/2015   K 3.5 02/23/2015   CL 102 03/02/2015  CL 102 02/24/2015   CL 100* 02/23/2015   CO2 25 03/02/2015   CO2 25 02/24/2015   CO2 28 02/23/2015   GLUCOSE 140* 03/02/2015   GLUCOSE 156* 02/24/2015   GLUCOSE 119* 02/23/2015   BUN 30* 03/02/2015   BUN 15 02/24/2015   BUN 10 02/23/2015   CREATININE 0.97 03/02/2015   CREATININE 0.93 02/24/2015   CREATININE 0.87 02/23/2015   CALCIUM 8.4* 03/02/2015   CALCIUM 8.4* 02/24/2015   CALCIUM 8.7* 02/23/2015   LFT No results for input(s): PROT, ALBUMIN, AST, ALT, ALKPHOS, BILITOT, BILIDIR, IBILI in the last 72 hours. PT/INR Lab Results  Component Value Date   INR 1.13 02/23/2015   INR 1.09 11/30/2014   INR 1.10 06/01/2014   Hepatitis Panel No results for input(s): HEPBSAG, HCVAB, HEPAIGM, HEPBIGM in the last 72 hours. C-Diff No components found for: CDIFF Lipase     Component  Value Date/Time   LIPASE 107* 03/27/2012 1545    Drugs of Abuse     Component Value Date/Time   LABOPIA NONE DETECTED 03/05/2012 1643   LABOPIA NEG 06/20/2010 2242   COCAINSCRNUR NONE DETECTED 03/05/2012 1643   COCAINSCRNUR POS* 06/20/2010 2242   LABBENZ NONE DETECTED 03/05/2012 1643   LABBENZ NEG 06/20/2010 2242   AMPHETMU NONE DETECTED 03/05/2012 1643   AMPHETMU NEG 06/20/2010 2242   THCU NONE DETECTED 03/05/2012 1643   LABBARB NONE DETECTED 03/05/2012 1643     RADIOLOGY STUDIES: No results found.  ENDOSCOPIC STUDIES: This is a list of some of the endoscopic/radiologic procedures found in the Epic records. It is by no means comprehensive.  Dr Ulyses Amor office has vast majority of the records.   Enteroscopy 02/25/15 per Dr Deatra Ina: 2 arteriovenous malformations measuring 32mm in size were found in the duodenal bulb; Argon plasma coagulation was applied to the sites; with good treatment effect with complete hemostasis achieved.  11/2011 [facility] GI blood loss/RBC scan:  Bleeding at proximal SB.   11/2011 flexible sigmoidoscopy. for painless hematochezia. Dr. Benson Norway Fresh clots of blood and mild diverticulosis in the left colon consistent with diverticular bleed. Small hemorrhoids also noted.  11/2011 EGD. Dr. Tora [facility]  05/2011 EGD. For GI bleed and history duodenal AVMs. Dr. Benson Norway Normal study  05/05/2011 and 05/08/2011 EGDs. Dr. Benson Norway Both normal studies  12/2010 EGD. For investigation of bleeding AVMs seen on capsule Endo. Dr. Benson Norway saw a nonbleeding AVM at D3. Bleeding AVM at D2. AVMs were endoclipped. Study reached the proximal jejunum.  02/23/2010. Colonoscopy. 2 cecum. Sessile sigmoid polyp removed. No pathology  IMPRESSION:   *  Recurrent GI blood loss anemia in pt with long hx of same.  Just had ablation of duodenal AVMs 6 days ago.  Current painless hematochezia most c/w diverticular bleed. Alternatively could be rapid UGI bleed (note BUN increased) Bleeding appears to  have stopped.   *  Complicated ventral hernia repair, 05/2014.  Periodic minor bleeding seen from closed but not yet healed wound.   * s/p 01/2012 partial left colectomy for recurrent diverticular bleeding.    PLAN:     *  Case d/w Dr Deatra Ina.  No plans for repeating colonoscopy or enteroscopy.  Ok to eat. Ordered carb mod diet.   *  In case of recurrent and active painless hematochezia, Nuc medicine RBC scan/ visceral angiogram are procedures of choice.    Azucena Freed  03/03/2015, 10:10 AM Pager: [phone]  GI Attending Note   Chart was reviewed and patient was examined. X-rays and lab were  reviewed.    I agree with management and plans. I am uncertain where she's bleeding from though hematochezia suggests a lower GI source.  Bleeding has clinically stopped.  Will follow expectantly.  Sandy Salaam. Deatra Ina, M.D., [facility] Cell [phone]

## 2015-03-04 ENCOUNTER — Other Ambulatory Visit: Payer: Medicare Other

## 2015-03-04 DIAGNOSIS — R630 Anorexia: Secondary | ICD-10-CM | POA: Diagnosis not present

## 2015-03-04 DIAGNOSIS — T8189XS Other complications of procedures, not elsewhere classified, sequela: Secondary | ICD-10-CM

## 2015-03-04 DIAGNOSIS — I10 Essential (primary) hypertension: Secondary | ICD-10-CM | POA: Diagnosis not present

## 2015-03-04 DIAGNOSIS — Q2733 Arteriovenous malformation of digestive system vessel: Secondary | ICD-10-CM

## 2015-03-04 DIAGNOSIS — K922 Gastrointestinal hemorrhage, unspecified: Secondary | ICD-10-CM | POA: Diagnosis not present

## 2015-03-04 DIAGNOSIS — T8189XA Other complications of procedures, not elsewhere classified, initial encounter: Secondary | ICD-10-CM | POA: Diagnosis present

## 2015-03-04 DIAGNOSIS — K219 Gastro-esophageal reflux disease without esophagitis: Secondary | ICD-10-CM | POA: Diagnosis not present

## 2015-03-04 DIAGNOSIS — K921 Melena: Secondary | ICD-10-CM | POA: Diagnosis not present

## 2015-03-04 DIAGNOSIS — E873 Alkalosis: Secondary | ICD-10-CM | POA: Diagnosis not present

## 2015-03-04 LAB — TYPE AND SCREEN
ABO/RH(D): AB POS
ANTIBODY SCREEN: NEGATIVE
UNIT DIVISION: 0
UNIT DIVISION: 0

## 2015-03-04 LAB — CBC
HCT: 27.9 % — ABNORMAL LOW (ref 36.0–46.0)
HEMATOCRIT: 31.6 % — AB (ref 36.0–46.0)
Hemoglobin: 8.9 g/dL — ABNORMAL LOW (ref 12.0–15.0)
Hemoglobin: 10.1 g/dL — ABNORMAL LOW (ref 12.0–15.0)
MCH: 28.3 pg (ref 26.0–34.0)
MCH: 28.4 pg (ref 26.0–34.0)
MCHC: 31.9 g/dL (ref 30.0–36.0)
MCHC: 32 g/dL (ref 30.0–36.0)
MCV: 88.6 fL (ref 78.0–100.0)
MCV: 88.8 fL (ref 78.0–100.0)
PLATELETS: 283 10*3/uL (ref 150–400)
PLATELETS: 309 10*3/uL (ref 150–400)
RBC: 3.15 MIL/uL — AB (ref 3.87–5.11)
RBC: 3.56 MIL/uL — ABNORMAL LOW (ref 3.87–5.11)
RDW: 21 % — ABNORMAL HIGH (ref 11.5–15.5)
RDW: 20.9 % — AB (ref 11.5–15.5)
WBC: 7.9 10*3/uL (ref 4.0–10.5)
WBC: 8 10*3/uL (ref 4.0–10.5)

## 2015-03-04 LAB — PORPHYRINS, FRACTIONATED, RANDOM URINE
Coproporphyrin III (PORFRU): 25.4 mcg/g creat (ref 4.1–76.4)
Coproporphyrin, random ur: 96.7 mcg/g creat — ABNORMAL HIGH (ref 5.6–28.6)
Heptacarboxyporphyrin, random ur: 4.2 mcg/g creat — ABNORMAL HIGH (ref <=2.9)
PENTACARBOXYPORPHYRIN, RANDOM UR: 2.7 ug/g{creat} (ref <=3.5)
TOTAL PORPHYRINS, RANDOM UR: 164.3 ug/g{creat} — AB (ref 23.3–132.4)
UROPORPHYRIN, RANDOM UR: 31.1 ug/g{creat} — AB (ref 3.1–18.2)
Uroporphyrin III (PORFRU): 4.2 mcg/g creat (ref <=4.8)

## 2015-03-04 LAB — GLUCOSE, CAPILLARY
GLUCOSE-CAPILLARY: 116 mg/dL — AB (ref 65–99)
Glucose-Capillary: 115 mg/dL — ABNORMAL HIGH (ref 65–99)

## 2015-03-04 MED ORDER — ONDANSETRON HCL 4 MG PO TABS
4.0000 mg | ORAL_TABLET | Freq: Once | ORAL | Status: AC
  Administered 2015-03-04: 4 mg via ORAL
  Filled 2015-03-04: qty 1

## 2015-03-04 MED ORDER — TRAZODONE HCL 100 MG PO TABS: 50.0000 mg | ORAL_TABLET | Freq: Every day | ORAL | Status: DC

## 2015-03-04 MED ORDER — ENSURE ACTIVE HIGH PROTEIN PO LIQD: 1.0000 | Freq: Three times a day (TID) | ORAL | Status: DC

## 2015-03-04 MED ORDER — ASPIRIN EC 81 MG PO TBEC: 81.0000 mg | DELAYED_RELEASE_TABLET | Freq: Every day | ORAL | Status: DC

## 2015-03-04 MED ORDER — PANTOPRAZOLE SODIUM 40 MG PO TBEC
40.0000 mg | DELAYED_RELEASE_TABLET | Freq: Every day | ORAL | Status: DC
  Administered 2015-03-04: 40 mg via ORAL
  Filled 2015-03-04: qty 1

## 2015-03-04 NOTE — Discharge Summary (Signed)
Name: [name] MRN: [id number] DOB: 07/20/1950 64 y.o. PCP: [name], MD  Date of Admission: 03/02/2015  3:56 PM Date of Discharge: 03/04/2015 Attending Physician: Sid Falcon, MD  Discharge Diagnosis: 1. Symptomatic anaemia due to GI bleeding of unknown origin, most likely from the AVMs that were ligated few weeks back, iron deficiency anaemia, chronic hepatitis C,   Discharge Medications:   Medication List    STOP taking these medications        ENSURE ACTIVE HIGH PROTEIN Liqd      TAKE these medications        amLODipine 10 MG tablet  Commonly known as:  NORVASC  Take 1 tablet (10 mg total) by mouth daily.     aspirin EC 81 MG tablet  Take 1 tablet (81 mg total) by mouth daily.     atenolol 50 MG tablet  Commonly known as:  TENORMIN  TAKE 1 TABLET BY MOUTH EVERY MORNING     cetirizine 10 MG tablet  Commonly known as:  ZYRTEC  Take 10 mg by mouth every morning.     FLUoxetine 40 MG capsule  Commonly known as:  PROZAC  TAKE 1 CAPSULE BY MOUTH DAILY     gabapentin 400 MG capsule  Commonly known as:  NEURONTIN  TAKE 2 CAPSULE BY MOUTH THREE TIMES A DAY     hydrochlorothiazide 25 MG tablet  Commonly known as:  HYDRODIURIL  Take 1 tablet (25 mg total) by mouth every morning.     Ledipasvir-Sofosbuvir 90-400 MG Tabs  Commonly known as:  HARVONI  Take 1 tablet by mouth daily.     lisinopril 40 MG tablet  Commonly known as:  PRINIVIL,ZESTRIL  Take 1 tablet (40 mg total) by mouth every morning.     omeprazole 20 MG capsule  Commonly known as:  PRILOSEC  Take 1 capsule (20 mg total) by mouth daily.     oxyCODONE-acetaminophen 10-325 MG tablet  Commonly known as:  PERCOCET  Take 1 tablet by mouth every 6 (six) hours as needed for pain.     potassium chloride 20 MEQ/15ML (10%) Soln  TAKE 15ML BY MOUTH EVERY DAY     traZODone 100 MG tablet  Commonly known as:  DESYREL  Take 0.5 tablets (50 mg total) by mouth at bedtime.     vitamin C 500 MG  tablet  Commonly known as:  ASCORBIC ACID  Take 500 mg by mouth every morning.        Disposition and follow-up:   [name] was discharged from [facility] in Stable condition.  At the hospital follow up visit please address:  1.  Please repeat the CBC- if her hemoglobin has dropped, then she will need an appointment with Dr. Deatra Ina. She may need recurrent PRBC transfusions, and or referral to a specialty center like [facility] or [facility] where her condition can be better managed by GI there.  2.  Labs / imaging needed at time of follow-up: CBC  3.  Pending labs/ test needing follow-up: none  Follow-up Appointments:     Follow-up Information    Please follow up Monday in our clinic at 8:15 AM.   Contact information:   [facility]      Discharge Instructions: Discharge Instructions    Diet - low sodium heart healthy    Complete by:  As directed      Discharge instructions    Complete by:  As directed  Your blood count is good when we checked just now.  Please come back to the ER if you have any more bloody diarrhea, if you feel lightheaded or dizzy, or if you have bloody vomiting. Please stop doing cocaine- it will make things worse  PLEASE FOLLOW UP IN OUR CLINIC THIS Monday to [place]- an appointment is made for you.     Increase activity slowly    Complete by:  As directed            Consultations: Treatment Team:  Inda Castle, MD  Procedures Performed:  Dg Knee Complete 4 Views Right  02/22/2015   CLINICAL DATA:  fall with history of osteoporosis i fell 2 days ago on rt knee,been having ant rt knee pain  : RIGHT KNEE - COMPLETE 4+ VIEW  COMPARISON:  02/11/2013  FINDINGS: Mild narrowing and osteophyte formation laterally. Moderate osteophyte formation and narrowing medial compartment. Diffuse osteopenia. Moderate osteophyte formation in the patellofemoral compartment. Very small joint effusion.  IMPRESSION: Moderate  tricompartmental arthritic change.   Electronically Signed   By: Skipper Cliche M.D.   On: 02/22/2015 14:03   Dg Hip Unilat With Pelvis 2-3 Views Right  02/22/2015   CLINICAL DATA:  Fall at home.  EXAM: DG HIP (WITH OR WITHOUT PELVIS) 2-3V RIGHT  COMPARISON:  None.  FINDINGS: There is no evidence of hip fracture or dislocation. There is no evidence of arthropathy or other focal bone abnormality.  IMPRESSION: Negative.   Electronically Signed   By: Kathreen Devoid   On: 02/22/2015 14:04    2D Echo:   Cardiac Cath:   Admission HPI:  Ms. [name] is a 64 year old African American lady with a history of long-standing anemia secondary to gastrointestinal bleeds from angiodysplasia, gastroesophageal reflux disease, hepatitis C being treating by Harvoni, and umbilical hernia repair in January 1740 complicated by poor wound healing. She presents as a direct admission to the hospital today from the Gastroenterology clinic after a two day history of bright red blood per rectum and black, tarry stools. A complete blood count in the clinic showed a hemoglobin of 7, compared to 11.2 just one day prior. She was actually discharged from the hospital 4 days ago for another gastrointestinal bleed; at that time, endoscopy showed two duodenal ateriovenous malformations which were cauterized. She also undergoes ferraheme transfusions every 3-4 months at [facility]. She denies lightheadedness, chest pain, or other concerns, and is otherwise feeling quite well.   Hospital Course by problem list:   1. Symptomatic anaemia due to GI bleeding: She came in from GI office where her hemoglobin was found to be 7, an acute drop from the previous one of 12. She has a history of anaemias 2/2 GI bleeds from AVMs in the duodenum and a history of diverticulitis and partial colon resection in the past. She was just admitted recently and had an enteroscopy and cauterization of 2 duodenal AVMs, which was likely the cause of  her current GIB. She was given 2 units of PRBCs and her post tranfusion HgB was 9.4. Aspirin was held during the admission.  GI was consulted and they did not recommend any intervention. THey recommended that in case of recurrent hematochezia, nuclear medicine RBC scan/ visceral angiogram were the procedures of choice. She was observed overnight, and her hemoglobin remained stable and was actually 10.1 at discharge. She will have a repeat CBC on Monday in our clinic, and followup will be arranged  with Dr Deatra Ina expectantly.   2.Wound healing: She had a ventral hernia repair in January and the wound was closed but not yet healed. There was no drainage, and wound care was consulted and they recommended leaving it open for now and following up with Dr Ninfa Linden.   3. Hypertension:was stable and well controlled on her current meds.    Discharge Vitals:   BP 159/81 mmHg  Pulse 69  Temp(Src) 98.6 F (37 C) (Oral)  Resp 18  Ht 5\' 3"  (1.6 m)  Wt 164 lb 6 oz (74.56 kg)  BMI 29.13 kg/m2  SpO2 98%  Discharge Labs:  Results for orders placed or performed during the hospital encounter of 03/02/15 (from the past 24 hour(s))  Glucose, capillary     Status: Abnormal   Collection Time: 03/03/15  5:01 PM  Result Value Ref Range   Glucose-Capillary 184 (H) 65 - 99 mg/dL  Urine rapid drug screen (hosp performed)     Status: Abnormal   Collection Time: 03/03/15  7:18 PM  Result Value Ref Range   Opiates NONE DETECTED NONE DETECTED   Cocaine POSITIVE (A) NONE DETECTED   Benzodiazepines NONE DETECTED NONE DETECTED   Amphetamines NONE DETECTED NONE DETECTED   Tetrahydrocannabinol NONE DETECTED NONE DETECTED   Barbiturates NONE DETECTED NONE DETECTED  Glucose, capillary     Status: Abnormal   Collection Time: 03/03/15  9:12 PM  Result Value Ref Range   Glucose-Capillary 129 (H) 65 - 99 mg/dL  CBC     Status: Abnormal   Collection Time: 03/04/15  4:54 AM  Result Value Ref Range   WBC 7.9 4.0 - 10.5  K/uL   RBC 3.15 (L) 3.87 - 5.11 MIL/uL   Hemoglobin 8.9 (L) 12.0 - 15.0 g/dL   HCT 27.9 (L) 36.0 - 46.0 %   MCV 88.6 78.0 - 100.0 fL   MCH 28.3 26.0 - 34.0 pg   MCHC 31.9 30.0 - 36.0 g/dL   RDW 21.0 (H) 11.5 - 15.5 %   Platelets 283 150 - 400 K/uL  Glucose, capillary     Status: Abnormal   Collection Time: 03/04/15  7:31 AM  Result Value Ref Range   Glucose-Capillary 115 (H) 65 - 99 mg/dL  Glucose, capillary     Status: Abnormal   Collection Time: 03/04/15 11:51 AM  Result Value Ref Range   Glucose-Capillary 116 (H) 65 - 99 mg/dL  CBC     Status: Abnormal   Collection Time: 03/04/15 12:00 PM  Result Value Ref Range   WBC 8.0 4.0 - 10.5 K/uL   RBC 3.56 (L) 3.87 - 5.11 MIL/uL   Hemoglobin 10.1 (L) 12.0 - 15.0 g/dL   HCT 31.6 (L) 36.0 - 46.0 %   MCV 88.8 78.0 - 100.0 fL   MCH 28.4 26.0 - 34.0 pg   MCHC 32.0 30.0 - 36.0 g/dL   RDW 20.9 (H) 11.5 - 15.5 %   Platelets 309 150 - 400 K/uL    Signed: Burgess Estelle, MD 03/04/2015, 12:49 PM    Services Ordered on Discharge:  Equipment Ordered on Discharge:

## 2015-03-04 NOTE — Progress Notes (Signed)
          Daily Rounding Note  03/04/2015, 10:06 AM  LOS: 2 days   SUBJECTIVE:       Black stool last PM and this AM.  No hematochezia.  No abd pain.  Eating well.   OBJECTIVE:         Vital signs in last 24 hours:    Temp:  [97.5 F (36.4 C)-99.1 F (37.3 C)] 98.3 F (36.8 C) (10/07 0925) Pulse Rate:  [70-79] 70 (10/07 0925) Resp:  [18] 18 (10/07 0925) BP: (110-134)/(54-71) 130/71 mmHg (10/07 0925) SpO2:  [95 %-98 %] 98 % (10/07 0925) Weight:  [164 lb 6 oz (74.56 kg)] 164 lb 6 oz (74.56 kg) (10/06 2022) Last BM Date: 02/26/15 Filed Weights   03/02/15 2030 03/03/15 2022  Weight: 163 lb 7.2 oz (74.14 kg) 164 lb 6 oz (74.56 kg)   General: looks well, comfortable   Heart: RRR Chest: clear bil.  Abdomen: soft, NT, NT.  No change in abdominal incision wound/scar  Extremities: no CCE.  AKA left Neuro/Psych:  Pleasant, calm.  Oriented x 3.   Intake/Output from previous day: 10/06 0701 - 10/07 0700 In: 1440 [P.O.:1440] Out: 1602 [Urine:1601; Stool:1]  Intake/Output this shift: Total I/O In: 840 [P.O.:840] Out: 400 [Urine:400]  Lab Results:  Recent Labs  03/02/15 1830 03/03/15 0558 03/04/15 0454  WBC 12.8* 9.0 7.9  HGB 7.0* 9.4* 8.9*  HCT 22.4* 28.6* 27.9*  PLT 304 257 283   BMET  Recent Labs  03/02/15 1830  NA 136  K 3.3*  CL 102  CO2 25  GLUCOSE 140*  BUN 30*  CREATININE 0.97  CALCIUM 8.4*   LFT No results for input(s): PROT, ALBUMIN, AST, ALT, ALKPHOS, BILITOT, BILIDIR, IBILI in the last 72 hours. PT/INR No results for input(s): LABPROT, INR in the last 72 hours. Hepatitis Panel No results for input(s): HEPBSAG, HCVAB, HEPAIGM, HEPBIGM in the last 72 hours.  Studies/Results: No results found.  ASSESMENT:   * Recurrent GI blood loss anemia in pt with long hx of same. s/p EGD and ablation of duodenal AVMs 02/25/15. Hgb improved post PRBC x 2. Painless hematochezia most c/w diverticular bleed.  Alternatively could be rapid UGI bleed (note BUN increased).  Passing dark stools, sugg of old blood.     * Complicated ventral hernia repair, 05/2014. Periodic minor bleeding seen from closed but not yet healed wound.   * s/p 01/2012 partial left colectomy for recurrent diverticular bleeding.    PLAN   *  If hgb stable and no further hematochezia, could discharge tomorrow. Not clear why she needs stat CBC now.  Did order CBC for the AM.      Azucena Freed  03/04/2015, 10:06 AM Pager: [phone]  GI Attending Note  I have personally taken an interval history, reviewed the chart, and examined the patient.  I agree with the extender's note, impression and recommendations.  Sandy Salaam. Deatra Ina, MD, [facility] [phone]

## 2015-03-04 NOTE — Discharge Instructions (Signed)
Your blood count is good when we checked just now.  Please come back to the ER if you have any more bloody diarrhea, if you feel lightheaded or dizzy, or if you have bloody vomiting. Please stop doing cocaine- it will make things worse  PLEASE FOLLOW UP IN OUR CLINIC THIS Monday to [place]- an appointment is made for you.

## 2015-03-04 NOTE — Progress Notes (Signed)
Subjective: Patient seen and examined at bedside. Pt had one episode of melena last night. She feels good and denies lightheadedness or dizziness, n/v.  Eager to go home  Objective: Vital signs in last 24 hours: Filed Vitals:   03/03/15 2022 03/04/15 0433 03/04/15 0925 03/04/15 1148  BP: 119/64 110/54 130/71 159/81  Pulse: 79 76 70 69  Temp: 99.1 F (37.3 C) 97.7 F (36.5 C) 98.3 F (36.8 C) 98.6 F (37 C)  TempSrc: Oral Oral Oral Oral  Resp: 18 18 18    Height:      Weight: 164 lb 6 oz (74.56 kg)     SpO2: 98% 95% 98% 98%   Weight change: 14.8 oz (0.42 kg)  Intake/Output Summary (Last 24 hours) at 03/04/15 1158 Last data filed at 03/04/15 0924  Gross per 24 hour  Intake   2280 ml  Output    802 ml  Net   1478 ml   General: Vital signs reviewed. Patient in no acute distress HEENT: mild conj pallor Cardiovascular: regular rate, rhythm, no murmur appreciated  Pulmonary/Chest: Clear to auscultation bilaterally, mild wheezing heard b/l Abdominal: Soft, non-tender, non-distended, BS +, has an open would on her umbilicus from the hernia repair in Jan, with scarring, is c/d/i, no drainage noted Extremities: No lower extremity edema bilaterally, pulses symmetric and intact bilaterally. Skin: Warm and well perfused  Lab Results: Results for orders placed or performed during the hospital encounter of 03/02/15 (from the past 24 hour(s))  Glucose, capillary     Status: Abnormal   Collection Time: 03/03/15 12:18 PM  Result Value Ref Range   Glucose-Capillary 135 (H) 65 - 99 mg/dL  Glucose, capillary     Status: Abnormal   Collection Time: 03/03/15  5:01 PM  Result Value Ref Range   Glucose-Capillary 184 (H) 65 - 99 mg/dL  Urine rapid drug screen (hosp performed)     Status: Abnormal   Collection Time: 03/03/15  7:18 PM  Result Value Ref Range   Opiates NONE DETECTED NONE DETECTED   Cocaine POSITIVE (A) NONE DETECTED   Benzodiazepines NONE DETECTED NONE DETECTED   Amphetamines NONE DETECTED NONE DETECTED   Tetrahydrocannabinol NONE DETECTED NONE DETECTED   Barbiturates NONE DETECTED NONE DETECTED  Glucose, capillary     Status: Abnormal   Collection Time: 03/03/15  9:12 PM  Result Value Ref Range   Glucose-Capillary 129 (H) 65 - 99 mg/dL  CBC     Status: Abnormal   Collection Time: 03/04/15  4:54 AM  Result Value Ref Range   WBC 7.9 4.0 - 10.5 K/uL   RBC 3.15 (L) 3.87 - 5.11 MIL/uL   Hemoglobin 8.9 (L) 12.0 - 15.0 g/dL   HCT 27.9 (L) 36.0 - 46.0 %   MCV 88.6 78.0 - 100.0 fL   MCH 28.3 26.0 - 34.0 pg   MCHC 31.9 30.0 - 36.0 g/dL   RDW 21.0 (H) 11.5 - 15.5 %   Platelets 283 150 - 400 K/uL  Glucose, capillary     Status: Abnormal   Collection Time: 03/04/15  7:31 AM  Result Value Ref Range   Glucose-Capillary 115 (H) 65 - 99 mg/dL  Glucose, capillary     Status: Abnormal   Collection Time: 03/04/15 11:51 AM  Result Value Ref Range   Glucose-Capillary 116 (H) 65 - 99 mg/dL    Micro Results: Recent Results (from the past 240 hour(s))  MRSA PCR Screening     Status: None   Collection Time:  02/23/15  1:09 PM  Result Value Ref Range Status   MRSA by PCR NEGATIVE NEGATIVE Final    Comment:        The GeneXpert MRSA Assay (FDA approved for NASAL specimens only), is one component of a comprehensive MRSA colonization surveillance program. It is not intended to diagnose MRSA infection nor to guide or monitor treatment for MRSA infections.    Studies/Results: No results found. Medications: I have reviewed the patient's current medications. Scheduled Meds: . amLODipine  10 mg Oral Daily  . aspirin EC  81 mg Oral Daily  . atenolol  50 mg Oral q morning - 10a  . feeding supplement (ENSURE ENLIVE)  1 Bottle Oral BID BM  . FLUoxetine  40 mg Oral Daily  . gabapentin  800 mg Oral TID  . hydrochlorothiazide  25 mg Oral q morning - 10a  . Ledipasvir-Sofosbuvir  1 tablet Oral Daily  . lisinopril  40 mg Oral q morning - 10a  . loratadine  10  mg Oral Daily  . pantoprazole  40 mg Oral Q0600  . traZODone  50 mg Oral QHS   Continuous Infusions:  PRN Meds:.oxyCODONE-acetaminophen **AND** oxyCODONE Assessment/Plan:  64 y.o. woman with IDA and history of recurrent GI bleeds due to duodenal angiodysplasia and diverticulosis who presented directly from GI office with BRBPR, bloody vomiting with an acute 5 point drop in Hgb from 3 days earlier when she had undergone laser coagulation of AVms in SB enteroscopy on 9/30 and had received 2 units of PRBC. Other PMH of HTN, NIDDM, HCV on Harvoni, and depression, and GERD.  GI Bleed: She had BRBPR and +FOBT , but she also had bloody vomiting, and melena so it may be either upper or lower GI bleed. Given that she had duodenal AVMs coagulated few days ago, that maybe the source.  -received 2 units PRBC on this admission. Had an episode of melena overnight.   -Hgb 9.4 --> 8.8 overnight -repeat cbc AM -transfuse if hgb < 7 -GI following- they are not planning for any interventions -Pantoprazole 40 mg IV daily  Illicit drug use- pt tested positive for cocaine in UDS   Disposition- discharge pending Hgb result     The patient does have a current PCP ([name], MD) and does need an [facility] hospital follow-up appointment after discharge.  The patient does not have transportation limitations that hinder transportation to clinic appointments.  .Services Needed at time of discharge: Y = Yes, Blank = No PT:   OT:   RN:   Equipment:   Other:     LOS: 2 days   Burgess Estelle, MD 03/04/2015, 11:58 AM

## 2015-03-04 NOTE — Progress Notes (Addendum)
Patient discharge teaching given, including activity, diet, follow-up appoints, and medications. Patient verbalized understanding of all discharge instructions. IV access was d/c'd. Vitals are stable. Skin is intact except as charted in most recent assessments. Pt to be escorted out by NT, to be driven home by family.  Yuri Fana E Hobbs  

## 2015-03-04 NOTE — Progress Notes (Signed)
  Date: 03/04/2015  Patient name: [name] record number: [id number]  Date of birth: 01-07-1951   This patient's plan of care was discussed with the house staff. Please see Dr. Sherlynn Carbon note for complete details. I concur with his findings.  [name] is doing better today, no more lightheadedness, one episode of melena overnight.  Hgb stable.  Okay for discharge.  Follow up with [facility] on Monday for CBC.  Follow up with GI once scheduled.    Sid Falcon, MD 03/04/2015, 4:54 PM

## 2015-03-04 NOTE — Progress Notes (Signed)
Pt states of feeling nausea. MD notified new orders put in for zofran. Will continue to monitor pt.   Angus Seller

## 2015-03-05 ENCOUNTER — Encounter (HOSPITAL_COMMUNITY): Payer: Self-pay | Admitting: Internal Medicine

## 2015-03-05 DIAGNOSIS — F1990 Other psychoactive substance use, unspecified, uncomplicated: Secondary | ICD-10-CM | POA: Diagnosis present

## 2015-03-07 ENCOUNTER — Ambulatory Visit (INDEPENDENT_AMBULATORY_CARE_PROVIDER_SITE_OTHER): Payer: Medicare Other | Admitting: Internal Medicine

## 2015-03-07 VITALS — BP 136/71 | HR 71 | Temp 98.4°F | Wt 170.0 lb

## 2015-03-07 DIAGNOSIS — D500 Iron deficiency anemia secondary to blood loss (chronic): Secondary | ICD-10-CM

## 2015-03-07 DIAGNOSIS — F17210 Nicotine dependence, cigarettes, uncomplicated: Secondary | ICD-10-CM

## 2015-03-07 DIAGNOSIS — Z09 Encounter for follow-up examination after completed treatment for conditions other than malignant neoplasm: Secondary | ICD-10-CM

## 2015-03-07 DIAGNOSIS — D649 Anemia, unspecified: Secondary | ICD-10-CM

## 2015-03-07 NOTE — Addendum Note (Signed)
Addended by: Hulan Fray on: 03/07/2015 05:08 PM   Modules accepted: Orders

## 2015-03-07 NOTE — Progress Notes (Signed)
Patient ID: [name], female   DOB: [birth year], 64 y.o.   MRN: [id number]   Subjective:   Patient ID: [name] female   DOB: 1950-10-28 64 y.o.   MRN: [id number]  HPI: [name] is a 64 y.o. with past medical history of iron deficiency anemia 2/2 AVM and diverticulosis, HTN, hepatitis C (currently receiving Harvoni), depression, GERD, vitamin D deficiency who presents today for hospital follow up.   Patient was admitted on 03/02/15 for symptomatic anemia when she was directly admitted from her GI office visit where her hemoglobin was found to be 7.  This was an acute drop from previous value of 12.  She does have a history of anemia due to GI bleed as a result of AVM in the duodenum and a history of diverticulosis.  In the hospital she was given 2 units PRBCs and her hemoglobin responded appropriately to 9.4.  At discharge her hemoglobin was 10.1.  Since discharge, she reports feeling great.  No signs of symtomatic anemia such as shortness of breath, pallor.  She reports no melena or hematochezia.  She states that she needs to plan to follow up with Dr. Deatra Ina next month.    Past Medical History  Diagnosis Date  . Hypertension   . Colon polyps   . Hypokalemic alkalosis   . Allergic rhinitis   . GERD (gastroesophageal reflux disease)   . Stroke [facility]) 2010    no residual problems  . Anemia, iron deficiency 05/03/2011    recieves iron infusions as well as periodic red blood cell transfusions  . GI bleeding     Has had bleeding from small bowel AVMs as well as from colon diverticulosis.  . Diverticulosis   . Difficulty sleeping     takes trazadone for sleep  . Peripheral vascular disease ([place])   . Hepatitis C     Began Harvoni early 82016. completion date late 02/2015.   Marland Kitchen Anxiety   . Diabetes mellitus without complication ([place])   . 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 amLODipine (NORVASC) 10 MG  tablet Take 1 tablet (10 mg total) by mouth daily. 30 tablet 3  . aspirin EC 81 MG tablet Take 1 tablet (81 mg total) by mouth daily. 30 tablet 1  . atenolol (TENORMIN) 50 MG tablet TAKE 1 TABLET BY MOUTH EVERY MORNING 30 tablet 9  . cetirizine (ZYRTEC) 10 MG tablet Take 10 mg by mouth every morning.     Marland Kitchen FLUoxetine (PROZAC) 40 MG capsule TAKE 1 CAPSULE BY MOUTH DAILY 30 capsule 5  . gabapentin (NEURONTIN) 400 MG capsule TAKE 2 CAPSULE BY MOUTH THREE TIMES A DAY 180 capsule 3  . hydrochlorothiazide (HYDRODIURIL) 25 MG tablet Take 1 tablet (25 mg total) by mouth every morning. 90 tablet 3  . Ledipasvir-Sofosbuvir (HARVONI) 90-400 MG TABS Take 1 tablet by mouth daily. 28 tablet 2  . lisinopril (PRINIVIL,ZESTRIL) 40 MG tablet Take 1 tablet (40 mg total) by mouth every morning. 90 tablet 4  . omeprazole (PRILOSEC) 20 MG capsule Take 1 capsule (20 mg total) by mouth daily. 30 capsule 11  . oxyCODONE-acetaminophen (PERCOCET) 10-325 MG tablet Take 1 tablet by mouth every 6 (six) hours as needed for pain. 120 tablet 0  . potassium chloride 20 MEQ/15ML (10%) SOLN TAKE 15ML BY MOUTH EVERY DAY (Patient taking differently: Take 20 mEq by mouth daily. TAKE 15ML BY MOUTH EVERY DAY) 450 mL  1  . traZODone (DESYREL) 100 MG tablet Take 0.5 tablets (50 mg total) by mouth at bedtime. 30 tablet 5  . vitamin C (ASCORBIC ACID) 500 MG tablet Take 500 mg by mouth every morning.     No current facility-administered medications for this visit.   Family History  Problem Relation Age of Onset  . Cancer Father     unknown  . Hypertension Mother   . Diverticulitis Mother    Social History   Social History  . Marital Status: Widowed    Spouse Name: N/A  . Number of Children: 3  . Years of Education: N/A   Occupational History  . Retired    Social History Main Topics  . Smoking status: Current Every Day Smoker -- 0.20 packs/day for 17 years    Types: Cigarettes  . Smokeless tobacco: Never Used     Comment:  wants to quit.  Smoking less 1 pk per week.  1-2 cigs per day  . Alcohol Use: 0.0 oz/week    0 Standard drinks or equivalent per week     Comment: Occasionally  . Drug Use: No     Comment: Thanksgiving and Christmas  Holidays 2014 hasn't had any since  . Sexual Activity: Yes    Birth Control/ Protection: Post-menopausal   Other Topics Concern  . Not on file   Social History Narrative   Review of Systems: Review of Systems  Constitutional: Negative for fever and chills.  HENT: Negative for congestion.   Eyes: Negative for blurred vision and pain.  Respiratory: Negative for cough.   Cardiovascular: Negative for chest pain.  Gastrointestinal: Negative for nausea, vomiting, blood in stool and melena.  Genitourinary: Negative for frequency.  Skin: Negative for rash.  Neurological: Negative for dizziness and headaches.  Psychiatric/Behavioral: Negative for depression.    Objective:  Physical Exam: Filed Vitals:   03/07/15 1430  BP: 136/71  Pulse: 71  Temp: 98.4 F (36.9 C)  TempSrc: Oral  Weight: 170 lb (77.111 kg)  SpO2: 99%   Physical Exam  Constitutional: She is oriented to person, place, and time. She appears well-developed and well-nourished.  HENT:  Head: Normocephalic and atraumatic.  Eyes: Conjunctivae and EOM are normal.  Neck: Normal range of motion.  Cardiovascular: Normal rate and regular rhythm.   Pulmonary/Chest: Effort normal and breath sounds normal.  Abdominal: Soft. Bowel sounds are normal.  Neurological: She is alert and oriented to person, place, and time.  Skin: Skin is warm.  Psychiatric: She has a normal mood and affect.    Assessment & Plan:  Please see Problem List for Assessment and Plan.

## 2015-03-08 LAB — CBC
Hematocrit: 32.9 % — ABNORMAL LOW (ref 34.0–46.6)
Hemoglobin: 10.5 g/dL — ABNORMAL LOW (ref 11.1–15.9)
MCH: 28.3 pg (ref 26.6–33.0)
MCHC: 31.9 g/dL (ref 31.5–35.7)
MCV: 89 fL (ref 79–97)
PLATELETS: 419 10*3/uL — AB (ref 150–379)
RBC: 3.71 x10E6/uL — AB (ref 3.77–5.28)
RDW: 21 % — AB (ref 12.3–15.4)
WBC: 9.2 10*3/uL (ref 3.4–10.8)

## 2015-03-08 NOTE — Assessment & Plan Note (Signed)
Assessment: Patient here for hospital follow up for symptomatic anemia in the setting of GI bleed 2/2 AVM and history of diverticulosis.  Since discharge, reports feeling well.  No signs of blood loss such as melena or BRBPR.  No episodes of dizziness, shortness of breath, or fatigue.  Plan: -CBC today shows hemoglobin 10.5, increased from 10.1 at hospital discharge -GI follow up.  Patient has plans to follow with Dr. Deatra Ina next month.

## 2015-03-08 NOTE — Progress Notes (Signed)
Called, no answer, will call back

## 2015-03-10 ENCOUNTER — Ambulatory Visit: Payer: Medicare Other | Admitting: Internal Medicine

## 2015-03-11 ENCOUNTER — Ambulatory Visit (HOSPITAL_BASED_OUTPATIENT_CLINIC_OR_DEPARTMENT_OTHER): Payer: Medicare Other

## 2015-03-11 ENCOUNTER — Encounter: Payer: Self-pay | Admitting: Hematology

## 2015-03-11 ENCOUNTER — Ambulatory Visit (HOSPITAL_BASED_OUTPATIENT_CLINIC_OR_DEPARTMENT_OTHER): Payer: Medicare Other | Admitting: Hematology

## 2015-03-11 VITALS — BP 127/67 | HR 68 | Temp 98.1°F | Resp 20 | Ht 63.0 in | Wt 172.5 lb

## 2015-03-11 DIAGNOSIS — Z89512 Acquired absence of left leg below knee: Secondary | ICD-10-CM | POA: Diagnosis not present

## 2015-03-11 DIAGNOSIS — D500 Iron deficiency anemia secondary to blood loss (chronic): Secondary | ICD-10-CM

## 2015-03-11 DIAGNOSIS — E119 Type 2 diabetes mellitus without complications: Secondary | ICD-10-CM

## 2015-03-11 DIAGNOSIS — R740 Nonspecific elevation of levels of transaminase and lactic acid dehydrogenase [LDH]: Secondary | ICD-10-CM | POA: Diagnosis not present

## 2015-03-11 DIAGNOSIS — Z720 Tobacco use: Secondary | ICD-10-CM

## 2015-03-11 DIAGNOSIS — B182 Chronic viral hepatitis C: Secondary | ICD-10-CM

## 2015-03-11 LAB — CBC & DIFF AND RETIC
BASO%: 0.4 % (ref 0.0–2.0)
Basophils Absolute: 0 10*3/uL (ref 0.0–0.1)
EOS%: 1.8 % (ref 0.0–7.0)
Eosinophils Absolute: 0.2 10*3/uL (ref 0.0–0.5)
HCT: 32.9 % — ABNORMAL LOW (ref 34.8–46.6)
HGB: 10.6 g/dL — ABNORMAL LOW (ref 11.6–15.9)
Immature Retic Fract: 5 % (ref 1.60–10.00)
LYMPH%: 25.6 % (ref 14.0–49.7)
MCH: 28.9 pg (ref 25.1–34.0)
MCHC: 32.2 g/dL (ref 31.5–36.0)
MCV: 89.6 fL (ref 79.5–101.0)
MONO#: 0.9 10*3/uL (ref 0.1–0.9)
MONO%: 10.8 % (ref 0.0–14.0)
NEUT%: 61.4 % (ref 38.4–76.8)
NEUTROS ABS: 5.1 10*3/uL (ref 1.5–6.5)
Platelets: 378 10*3/uL (ref 145–400)
RBC: 3.67 10*6/uL — AB (ref 3.70–5.45)
RDW: 19.5 % — ABNORMAL HIGH (ref 11.2–14.5)
Retic %: 3.58 % — ABNORMAL HIGH (ref 0.70–2.10)
Retic Ct Abs: 131.39 10*3/uL — ABNORMAL HIGH (ref 33.70–90.70)
WBC: 8.2 10*3/uL (ref 3.9–10.3)
lymph#: 2.1 10*3/uL (ref 0.9–3.3)

## 2015-03-11 LAB — IRON AND TIBC CHCC
%SAT: 10 % — AB (ref 21–57)
Iron: 46 ug/dL (ref 41–142)
TIBC: 456 ug/dL — ABNORMAL HIGH (ref 236–444)
UIBC: 410 ug/dL — AB (ref 120–384)

## 2015-03-11 LAB — COMPREHENSIVE METABOLIC PANEL (CC13)
ALT: 32 U/L (ref 0–55)
AST: 29 U/L (ref 5–34)
Albumin: 3.6 g/dL (ref 3.5–5.0)
Alkaline Phosphatase: 80 U/L (ref 40–150)
Anion Gap: 10 mEq/L (ref 3–11)
BUN: 15.9 mg/dL (ref 7.0–26.0)
CO2: 26 meq/L (ref 22–29)
Calcium: 9.1 mg/dL (ref 8.4–10.4)
Chloride: 102 mEq/L (ref 98–109)
Creatinine: 0.8 mg/dL (ref 0.6–1.1)
GLUCOSE: 134 mg/dL (ref 70–140)
POTASSIUM: 4.4 meq/L (ref 3.5–5.1)
SODIUM: 137 meq/L (ref 136–145)
Total Bilirubin: 0.3 mg/dL (ref 0.20–1.20)
Total Protein: 7.3 g/dL (ref 6.4–8.3)

## 2015-03-11 LAB — FERRITIN CHCC: Ferritin: 87 ng/ml (ref 9–269)

## 2015-03-11 NOTE — Progress Notes (Signed)
[facility] OFFICE PROGRESS NOTE   Patricia Mire, MD [place] [address]  DIAGNOSIS: Iron deficiency anemia due to chronic blood loss  Porphyria cutanea tarda ([place]).   PROBLEM LIST:  1. Recurrent iron-deficiency anemia secondary to gastrointestinal bleeding (as noted above). [name] also has required red cell transfusions in the past, most recently early December 2012. She underwent a small bowel enteroscopy by Dr. Dan Humphreys at [facility] on 08/13/2011. The enteroscope was passed up to 6 feet into the jejunum. He saw multiple AVMs, which were ablated. Two of the large AVMs bled during APC. These were in the 4th part of the duodenum very close to the ligament of Treitz. The remainder of the AVMs were in the proximal jejunum. In addition to the small bowel AV malformations, which were treated with APC, he saw some small esophageal varices which were not bleeding and also evidence of mild portal hypertensive gastropathy.  2. History of duodenal arteriovenous malformation.  3. History of hepatitis C infection.  4. Gastroesophageal reflux disease.  5. Depression.  6. Hypertension.  7. History of right Bell's palsy.  8. Status post left above-knee amputation secondary to necrotizing fasciitis 03/06/2009.  9. Neurodermatitis.  10. Previous history of alcohol usage.  11. Admission to the hospital from 12/20/2011 through 12/25/2011 for hematochezia felt to be secondary to a diverticular bleed, requiring 2 units of packed red cells.  12. Diverticulosis.  13. Elevated alpha fetoprotein, 72.3 on 11/19/2011 and 76.0 on 01/11/2012.  14. Abnormal MRI of the liver from 11/28/2011 showing diffuse and markedly low signal intensity throughout the liver as well as diffuse low signal intensity in the adrenal glands. 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 There were no findings for  cirrhosis, portal hypertension, splenomegaly or ascites, not were there any worrisome enhancing liver lesions.  15. Systolic ejection murmur.   CHIEF COMPLAIN: Follow up anemia  PRIOR TREATMENT: The patient has been heavily dependent upon IV Feraheme. Over the past year or so she has been receiving about 1 Feraheme infusion monthly on average. The patient's most recent infusions of IV Feraheme 510 mg occurred on the following dates: 06/05/2011, 07/03/2011, 07/31/2011, 08/17/2011, 08/28/2011. The patient received IV Feraheme 1020 mg on 10/02/2011 01/30/2012, 06/06/2012 and 09/10/2012, 05/2013, 07/2013, 08/2013, 09/2013, 11/12/2013, 01/15/2014 and 03/16/2014, 09/16/2014 and 09/23/2014, 11/2014 and 02/24/2015   CURRENT TREATMENT: As noted above. Feraheme prn, will be cautious for iron supplement due to PCT  INTERVAL HISTORY:  [name] 64 y.o. female with history of recurrent iron-deficiency anemia due to GI bleeding is here for follow-up. GI bleeding is probably due to AV malformations and diverticulosis. She presents to clinic by her self.   She had recurrent GI bleeding secondary to AVM and was admitted on 02/26/2015 and 03/04/2015. She received Feraheme 510mg  once during the first admission and 2 u RBC during the second admission. She was seen by GI and followed by Dr. Deatra Ina. She recovered well, denies any more bleeding since the recent hospital discharge on 03/04/2015.   MEDICAL HISTORY: Past Medical History  Diagnosis Date  . Hypertension   . Colon polyps   . Hypokalemic alkalosis   . Allergic rhinitis   . GERD (gastroesophageal reflux disease)   . Stroke [place]) 2010    no residual problems  . Anemia, iron deficiency 05/03/2011    recieves iron infusions as well as periodic red blood cell transfusions  .  GI bleeding     Has had bleeding from small bowel AVMs as well as from colon diverticulosis.  . Diverticulosis   . Difficulty sleeping     takes trazadone for sleep  . Peripheral vascular  disease ([place])   . Hepatitis C     Began Harvoni early 82016. completion date late 02/2015.   Marland Kitchen Anxiety   . Diabetes mellitus without complication ([place])   . PCT (porphyria cutanea tarda) ([facility])      ALLERGIES:  is allergic to ciprofloxacin; morphine and related; penicillins; codeine; and feraheme.  MEDICATIONS: has a current medication list which includes the following prescription(s): amlodipine, aspirin ec, atenolol, cetirizine, fluoxetine, gabapentin, hydrochlorothiazide, ledipasvir-sofosbuvir, lisinopril, omeprazole, oxycodone-acetaminophen, potassium chloride, trazodone, and vitamin c.  SURGICAL HISTORY:  Past Surgical History  Procedure Laterality Date  . Leg amputation above knee    . Esophagogastroduodenoscopy  05/05/2011    Procedure: ESOPHAGOGASTRODUODENOSCOPY (EGD);  Surgeon: Zenovia Jarred, MD;  Location: [facility];  Service: Gastroenterology;  Laterality: N/A;  Dr. Hilarie Fredrickson will do procedure for Dr. Benson Norway Saturday.  . Esophagogastroduodenoscopy  05/08/2011    Procedure: ESOPHAGOGASTRODUODENOSCOPY (EGD);  Surgeon: Beryle Beams;  Location: [facility];  Service: Endoscopy;  Laterality: N/A;  . Esophagogastroduodenoscopy  06/07/2011    Procedure: ESOPHAGOGASTRODUODENOSCOPY (EGD);  Surgeon: Beryle Beams, MD;  Location: [facility];  Service: Endoscopy;  Laterality: N/A;  . Hot hemostasis  06/07/2011    Procedure: HOT HEMOSTASIS (ARGON PLASMA COAGULATION/BICAP);  Surgeon: Beryle Beams, MD;  Location: [facility];  Service: Endoscopy;  Laterality: N/A;  . Esophagogastroduodenoscopy  12/20/2011    Procedure: ESOPHAGOGASTRODUODENOSCOPY (EGD);  Surgeon: Beryle Beams, MD;  Location: [facility];  Service: Endoscopy;  Laterality: N/A;  . Flexible sigmoidoscopy  12/21/2011    Procedure: FLEXIBLE SIGMOIDOSCOPY;  Surgeon: Beryle Beams, MD;  Location: [facility];  Service: Endoscopy;  Laterality: N/A;  . Carpal tunnel release      rt hand  . Partial colectomy  02/15/2012     Procedure: PARTIAL COLECTOMY;  Surgeon: Harl Bowie, MD;  Location: [facility];  Service: General;  Laterality: N/A;  . Laparotomy  02/16/2012    Procedure: EXPLORATORY LAPAROTOMY;  Surgeon: Edward Jolly, MD;  Location: [facility];  Service: General;  Laterality: N/A;  oversewing of anastomotic leak and rigid sigmoidoscopy  . Enteroscopy N/A 12/04/2012    Procedure: ENTEROSCOPY;  Surgeon: Beryle Beams, MD;  Location: [facility];  Service: Endoscopy;  Laterality: N/A;  . Enteroscopy N/A 12/18/2012    Procedure: ENTEROSCOPY;  Surgeon: Beryle Beams, MD;  Location: [facility];  Service: Endoscopy;  Laterality: N/A;  . Eye surgery Bilateral     cataracts  . Tonsillectomy    . Abdominal hysterectomy    . Insertion of mesh N/A 06/01/2014    Procedure: INSERTION OF MESH;  Surgeon: Coralie Keens, MD;  Location: [facility];  Service: General;  Laterality: N/A;  . Incisional hernia repair N/A 06/01/2014    Procedure: ATTEMPTED LAPAROSCOPIC AND OPEN INCISIONAL HERNIA REPAIR WITH MESH;  Surgeon: Coralie Keens, MD;  Location: [facility];  Service: General;  Laterality: N/A;  . Laparotomy N/A 06/21/2014    Procedure: ABDOMINAL WOUND EXPLORATION;  Surgeon: Coralie Keens, MD;  Location: [facility];  Service: General;  Laterality: N/A;  . Application of wound vac N/A 06/21/2014    Procedure: APPLICATION OF WOUND VAC;  Surgeon: Coralie Keens, MD;  Location: [facility];  Service: General;  Laterality: N/A;  . Enteroscopy N/A 02/25/2015  Procedure: ENTEROSCOPY;  Surgeon: Inda Castle, MD;  Location: [facility];  Service: Endoscopy;  Laterality: N/A;     Medication List       This list is accurate as of: 03/11/15  7:14 AM.  Always use your most recent med list.               amLODipine 10 MG tablet  Commonly known as:  NORVASC  Take 1 tablet (10 mg total) by mouth daily.     aspirin EC 81 MG tablet  Take 1 tablet (81 mg total) by mouth daily.     atenolol 50 MG tablet  Commonly known as:   TENORMIN  TAKE 1 TABLET BY MOUTH EVERY MORNING     cetirizine 10 MG tablet  Commonly known as:  ZYRTEC  Take 10 mg by mouth every morning.     FLUoxetine 40 MG capsule  Commonly known as:  PROZAC  TAKE 1 CAPSULE BY MOUTH DAILY     gabapentin 400 MG capsule  Commonly known as:  NEURONTIN  TAKE 2 CAPSULE BY MOUTH THREE TIMES A DAY     hydrochlorothiazide 25 MG tablet  Commonly known as:  HYDRODIURIL  Take 1 tablet (25 mg total) by mouth every morning.     Ledipasvir-Sofosbuvir 90-400 MG Tabs  Commonly known as:  HARVONI  Take 1 tablet by mouth daily.     lisinopril 40 MG tablet  Commonly known as:  PRINIVIL,ZESTRIL  Take 1 tablet (40 mg total) by mouth every morning.     omeprazole 20 MG capsule  Commonly known as:  PRILOSEC  Take 1 capsule (20 mg total) by mouth daily.     oxyCODONE-acetaminophen 10-325 MG tablet  Commonly known as:  PERCOCET  Take 1 tablet by mouth every 6 (six) hours as needed for pain.     potassium chloride 20 MEQ/15ML (10%) Soln  TAKE 15ML BY MOUTH EVERY DAY     traZODone 100 MG tablet  Commonly known as:  DESYREL  Take 0.5 tablets (50 mg total) by mouth at bedtime.     vitamin C 500 MG tablet  Commonly known as:  ASCORBIC ACID  Take 500 mg by mouth every morning.         REVIEW OF SYSTEMS:   Constitutional: Denies fevers, chills or abnormal weight loss; Reports mild fatigue.  Eyes: Denies blurriness of vision Ears, nose, mouth, throat, and face: Denies mucositis or sore throat Respiratory: Denies cough, dyspnea or wheezes Cardiovascular: Denies palpitation, chest discomfort or lower extremity swelling Gastrointestinal:  Denies nausea, heartburn or change in bowel habits, hernia is painful and bleeds sometime the overlying skin. Skin: Denies abnormal skin rashes Lymphatics: Denies new lymphadenopathy or easy bruising Neurological:Denies numbness, tingling or new weaknesses Behavioral/Psych: Mood is stable, no new changes  All other  systems were reviewed with the patient and are negative.  PHYSICAL EXAMINATION: ECOG PERFORMANCE STATUS: 2  Blood pressure 127/67, pulse 68, temperature 98.1 F (36.7 C), temperature source Oral, resp. rate 20, height 5\' 3"  (1.6 m), weight 172 lb 8 oz (78.245 kg), SpO2 99 %.  General appearance: alert, appears stated age, no distress and moderately obese  Head: Normocephalic, without obvious abnormality, atraumatic  Neck: no adenopathy, supple, symmetrical, trachea midline and thyroid not enlarged, symmetric, no tenderness/mass/nodules  Lymph nodes: Cervical adenopathy: None appreciated  Heart:regular rate and rhythm, S1, S2 normal and systolic murmur: systolic ejection 2/6, harsh and previously documented without radiation  Lung:chest clear, no wheezing, rales, normal  symmetric air entry, Heart exam - S1, S2 normal, no murmur, no gallop, rate regular  Abdomin: soft, distended, normal bowel sounds, nontender, (+) abdominal surgical wound is covered and has a wound vacuum.   EXT:No peripheral edema on right extremity; Left above the knee amputation. She is wearing a prothesis.   Labs:  CBC Latest Ref Rng 03/11/2015 03/07/2015 03/04/2015  WBC 3.9 - 10.3 10e3/uL 8.2 9.2 8.0  Hemoglobin 11.6 - 15.9 g/dL 10.6(L) - 10.1(L)  Hematocrit 34.8 - 46.6 % 32.9(L) 32.9(L) 31.6(L)  Platelets 145 - 400 10e3/uL 378 - 309    CMP Latest Ref Rng 03/02/2015 02/24/2015 02/23/2015  Glucose 65 - 99 mg/dL 140(H) 156(H) 119(H)  BUN 6 - 20 mg/dL 30(H) 15 10  Creatinine 0.44 - 1.00 mg/dL 0.97 0.93 0.87  Sodium 135 - 145 mmol/L 136 136 135  Potassium 3.5 - 5.1 mmol/L 3.3(L) 3.5 3.5  Chloride 101 - 111 mmol/L 102 102 100(L)  CO2 22 - 32 mmol/L 25 25 28   Calcium 8.9 - 10.3 mg/dL 8.4(L) 8.4(L) 8.7(L)  Total Protein 6.5 - 8.1 g/dL - - 6.5  Total Bilirubin 0.3 - 1.2 mg/dL - - 0.3  Alkaline Phos 38 - 126 U/L - - 76  AST 15 - 41 U/L - - 29  ALT 14 - 54 U/L - - 24   Results for [name], [name] (MRN [id number]) as of  03/13/2015 18:37  Ref. Range 02/23/2015 18:10 03/11/2015 09:35  Iron Latest Ref Range: 41-142 ug/dL 14 (L) 46  UIBC Latest Ref Range: 120-384 ug/dL 529 410 (H)  TIBC Latest Ref Range: 236-444 ug/dL 543 (H) 456 (H)  %SAT Latest Ref Range: 21-57 %  10 (L)  Saturation Ratios Latest Ref Range: 10.4-31.8 % 3 (L)   Ferritin Latest Ref Range: 9-269 ng/ml 7 (L) 87    Porphyrins, fractionated, random urine  Status: Finalresult Visible to patient:  Not Released Nextappt: 03/15/2015 at 10:15 AM in Infectious Diseases (COMER, ROBERT, MD)          Notes Recorded by Patricia Mire, MD on 03/05/2015 at 3:11 PM Pt with elevated urine porphyrins consistent with PCT.     Ref Range 2wk ago    Uroporphyrin, random ur 3.1 - 18.2 mcg/g creat 31.1 (H)   Uroporphyrin III (PORFRU) <=4.8 mcg/g creat 4.2   Heptacarboxyporphyrin, random ur <=2.9 mcg/g creat 4.2 (H)   Hexacarboxyporphyrin, random ur <= 5.4 mcg/g creat SEE NOTE   Comments: Result is below reportable range for this analyte.   Pentacarboxyporphyrin, random ur <=3.5 mcg/g creat 2.7   Coproporphyrin, random ur 5.6 - 28.6 mcg/g creat 96.7 (H)   Coproporphyrin III (PORFRU) 4.1 - 76.4 mcg/g creat 25.4   Total Porphyrins, random ur 23.3 - 132.4 mcg/g creat 164.3 (H)          RADIOGRAPHIC STUDIES: Mm Digital Screening Bilateral  07/31/2013   CLINICAL DATA:  Screening.  EXAM: DIGITAL SCREENING BILATERAL MAMMOGRAM WITH CAD  COMPARISON:  Previous exam(s).  ACR Breast Density Category c: The breast tissue is heterogeneously dense, which may obscure small masses.  FINDINGS: There are no findings suspicious for malignancy. Images were processed with CAD.  IMPRESSION: No mammographic evidence of malignancy. A result letter of this screening mammogram will be mailed directly to the patient.  RECOMMENDATION: Screening mammogram in one year. (Code:SM-B-01Y)  BI-RADS CATEGORY  1: Negative.   Electronically Signed   By: Abelardo Diesel M.D.    On: 07/31/2013 09:01   ASSESSMENT: SERAI TUKES [address]  y.o. female with a history of No diagnosis found.   PLAN:  1. Iron deficiency anemia secondary to GI loss -She has been having frequent GI bleeding lately, required blood transfusion and IV Feraheme in the hospital. -Repeated on study today showed improvement ferritin, serum iron was normal, saturation is still low. -Giving her PCT, I'll be cautious about her iron supplement, which can worse PCT.  -I will hold on iv iron for now, and only replace it when iron study shows severe iron deficiency    2. Porphyria cutaneous tarda (PCT) -per pt, she had significant sun sensitivity which caused skin blisters on her arms and legs, she has multiple skin pigmentations from healed skin rash on her arms. -Both her urine and serum porphyria, especially uroporphyrin and coproporphyrin all elevated.  -This is likely secondary to her hepatitis C -She is currently being treated for hepatitis C. Hopefully her PCT will be improved after her hep c treatment    3. Elevated transiminases, likely secondary to her chronic hepatitis C  -She was tested positive for hepatitis C in 2009. She is currently on treated for hepatitis C.    4. DM2. --Management per PCP's office.   5. Left BKA -She has a prosthesis, she is wheelchair bound most of time, I encouraged her to continue physical therapy,   6. Smoking cessation -She is still actively smoking, I highly encourage her to quit smoking.  Plan  -lab every 2 weeks for the next 2 month -blood transfusion if Hb,8, and consider iv feraheme if severe iron deficiency (both ferritin and serum iron, saturation below normal)   All questions were answered. The patient knows to call the clinic with any problems, questions or concerns. We can certainly see the patient much sooner if necessary.  I spent 20 minutes counseling the patient face to face. The total time spent in the appointment was 25 minutes.  Truitt Merle 03/11/2015

## 2015-03-15 ENCOUNTER — Ambulatory Visit: Payer: Medicare Other | Admitting: Internal Medicine

## 2015-03-15 ENCOUNTER — Ambulatory Visit (INDEPENDENT_AMBULATORY_CARE_PROVIDER_SITE_OTHER): Payer: Medicare Other | Admitting: Internal Medicine

## 2015-03-15 ENCOUNTER — Encounter: Payer: Self-pay | Admitting: Internal Medicine

## 2015-03-15 VITALS — BP 109/72 | HR 74 | Temp 98.3°F | Wt 173.0 lb

## 2015-03-15 DIAGNOSIS — R188 Other ascites: Secondary | ICD-10-CM

## 2015-03-15 DIAGNOSIS — Z23 Encounter for immunization: Secondary | ICD-10-CM

## 2015-03-15 DIAGNOSIS — K7460 Unspecified cirrhosis of liver: Secondary | ICD-10-CM | POA: Diagnosis not present

## 2015-03-15 DIAGNOSIS — B182 Chronic viral hepatitis C: Secondary | ICD-10-CM

## 2015-03-15 DIAGNOSIS — Z8619 Personal history of other infectious and parasitic diseases: Secondary | ICD-10-CM | POA: Insufficient documentation

## 2015-03-15 NOTE — Progress Notes (Signed)
   Subjective:    Patient ID: [name], female    DOB: 01/14/1951, 64 y.o.   MRN: [id number]  HPI Here for follow up of HCV.  Genotype 1b, viral load 1.1 million.  Started harvoni 01/04/15.  No fatigue, no headache.  Pleased with medication.  Early viral load now undetectable.  F3/4 on elastography c/w significant fibrosis.  Sees Dr. Deatra Ina for GI.     Review of Systems  Constitutional: Negative for fatigue.  Gastrointestinal: Negative for nausea and diarrhea.  Neurological: Negative for dizziness and headaches.       Objective:   Physical Exam  Constitutional: She appears well-developed and well-nourished. No distress.  Eyes: No scleral icterus.  Cardiovascular: Normal rate, regular rhythm and normal heart sounds.   No murmur heard. Pulmonary/Chest: Effort normal and breath sounds normal. No respiratory distress.  Skin: No rash noted.          Assessment & Plan:

## 2015-03-15 NOTE — Assessment & Plan Note (Signed)
Seeing GI.  Will do screening ultrasound every 6 months.

## 2015-03-15 NOTE — Addendum Note (Signed)
Addended by: [name] A on: 03/15/2015 11:53 AM   Modules accepted: Orders

## 2015-03-15 NOTE — Progress Notes (Signed)
I saw and evaluated the patient.  I personally confirmed the key portions of Dr. Wallace's history and exam and reviewed pertinent patient test results.  The assessment, diagnosis, and plan were formulated together and I agree with the documentation in the resident's note. 

## 2015-03-15 NOTE — Assessment & Plan Note (Signed)
Doing good on medication and will come back for SVR12.

## 2015-03-17 ENCOUNTER — Telehealth: Payer: Self-pay | Admitting: *Deleted

## 2015-03-17 ENCOUNTER — Telehealth: Payer: Self-pay | Admitting: Internal Medicine

## 2015-03-17 NOTE — Telephone Encounter (Signed)
Yes I agree with the medication change, thanks!  Dr. Naaman Plummer

## 2015-03-17 NOTE — Telephone Encounter (Signed)
Called Dr. Zenia Resides office.  LOV was 12/16/2014. Office notes being faxed.

## 2015-03-17 NOTE — Telephone Encounter (Signed)
Pharm calls about trazodone, asking for clarification and a change, script 10/7 was for trazodone 100mg , take 0.5 tablet by mouth at bedtime #30 w/ 5 refills, this would be a 60 day supply, the pharm can give her trazodone 50mg  1 by mouth at bedtime #30 w/ 5 refills, both are 0.00 copays, verbal approval is given, do you agree? If so will you change the medlist?

## 2015-03-18 ENCOUNTER — Encounter: Payer: Self-pay | Admitting: *Deleted

## 2015-03-24 ENCOUNTER — Other Ambulatory Visit: Payer: Medicare Other

## 2015-03-25 ENCOUNTER — Telehealth: Payer: Self-pay | Admitting: Hematology

## 2015-03-25 NOTE — Telephone Encounter (Signed)
Returned patient's call re r/s 10/27 lab. Gave patient new appointment for 10/31.

## 2015-03-28 ENCOUNTER — Other Ambulatory Visit (HOSPITAL_BASED_OUTPATIENT_CLINIC_OR_DEPARTMENT_OTHER): Payer: Medicare Other

## 2015-03-28 DIAGNOSIS — B182 Chronic viral hepatitis C: Secondary | ICD-10-CM

## 2015-03-28 DIAGNOSIS — D500 Iron deficiency anemia secondary to blood loss (chronic): Secondary | ICD-10-CM | POA: Diagnosis present

## 2015-03-28 LAB — CBC & DIFF AND RETIC
BASO%: 0.5 % (ref 0.0–2.0)
Basophils Absolute: 0 10*3/uL (ref 0.0–0.1)
EOS%: 2.7 % (ref 0.0–7.0)
Eosinophils Absolute: 0.2 10*3/uL (ref 0.0–0.5)
HCT: 26.8 % — ABNORMAL LOW (ref 34.8–46.6)
HGB: 8.5 g/dL — ABNORMAL LOW (ref 11.6–15.9)
IMMATURE RETIC FRACT: 23.8 % — AB (ref 1.60–10.00)
LYMPH#: 2.9 10*3/uL (ref 0.9–3.3)
LYMPH%: 35.1 % (ref 14.0–49.7)
MCH: 27.2 pg (ref 25.1–34.0)
MCHC: 31.7 g/dL (ref 31.5–36.0)
MCV: 85.9 fL (ref 79.5–101.0)
MONO#: 0.9 10*3/uL (ref 0.1–0.9)
MONO%: 11 % (ref 0.0–14.0)
NEUT%: 50.7 % (ref 38.4–76.8)
NEUTROS ABS: 4.3 10*3/uL (ref 1.5–6.5)
Platelets: 301 10*3/uL (ref 145–400)
RBC: 3.12 10*6/uL — AB (ref 3.70–5.45)
RDW: 17.3 % — ABNORMAL HIGH (ref 11.2–14.5)
RETIC %: 2.88 % — AB (ref 0.70–2.10)
Retic Ct Abs: 89.86 10*3/uL (ref 33.70–90.70)
WBC: 8.4 10*3/uL (ref 3.9–10.3)

## 2015-03-28 LAB — IRON AND TIBC CHCC
%SAT: 4 % — AB (ref 21–57)
Iron: 16 ug/dL — ABNORMAL LOW (ref 41–142)
TIBC: 453 ug/dL — ABNORMAL HIGH (ref 236–444)
UIBC: 437 ug/dL — AB (ref 120–384)

## 2015-03-28 LAB — COMPREHENSIVE METABOLIC PANEL (CC13)
ALT: 26 U/L (ref 0–55)
AST: 29 U/L (ref 5–34)
Albumin: 3.4 g/dL — ABNORMAL LOW (ref 3.5–5.0)
Alkaline Phosphatase: 79 U/L (ref 40–150)
Anion Gap: 9 mEq/L (ref 3–11)
BUN: 30.1 mg/dL — AB (ref 7.0–26.0)
CO2: 24 meq/L (ref 22–29)
Calcium: 8.7 mg/dL (ref 8.4–10.4)
Chloride: 103 mEq/L (ref 98–109)
Creatinine: 1.2 mg/dL — ABNORMAL HIGH (ref 0.6–1.1)
EGFR: 59 mL/min/{1.73_m2} — AB (ref >90)
GLUCOSE: 145 mg/dL — AB (ref 70–140)
POTASSIUM: 4.5 meq/L (ref 3.5–5.1)
SODIUM: 136 meq/L (ref 136–145)
TOTAL PROTEIN: 6.8 g/dL (ref 6.4–8.3)

## 2015-03-28 LAB — FERRITIN CHCC: Ferritin: 21 ng/ml (ref 9–269)

## 2015-04-01 ENCOUNTER — Encounter: Payer: Self-pay | Admitting: Internal Medicine

## 2015-04-01 ENCOUNTER — Ambulatory Visit (INDEPENDENT_AMBULATORY_CARE_PROVIDER_SITE_OTHER): Payer: Medicare Other | Admitting: Internal Medicine

## 2015-04-01 VITALS — BP 111/66 | HR 76 | Temp 98.7°F | Wt 173.2 lb

## 2015-04-01 DIAGNOSIS — Z0000 Encounter for general adult medical examination without abnormal findings: Secondary | ICD-10-CM

## 2015-04-01 DIAGNOSIS — Z8719 Personal history of other diseases of the digestive system: Secondary | ICD-10-CM

## 2015-04-01 DIAGNOSIS — I10 Essential (primary) hypertension: Secondary | ICD-10-CM

## 2015-04-01 DIAGNOSIS — E559 Vitamin D deficiency, unspecified: Secondary | ICD-10-CM

## 2015-04-01 DIAGNOSIS — Z9889 Other specified postprocedural states: Secondary | ICD-10-CM

## 2015-04-01 DIAGNOSIS — Z23 Encounter for immunization: Secondary | ICD-10-CM | POA: Diagnosis not present

## 2015-04-01 DIAGNOSIS — B182 Chronic viral hepatitis C: Secondary | ICD-10-CM

## 2015-04-01 MED ORDER — VITAMIN D (CHOLECALCIFEROL) 25 MCG (1000 UT) PO TABS: ORAL_TABLET | ORAL | Status: DC

## 2015-04-01 MED ORDER — OXYCODONE-ACETAMINOPHEN 10-325 MG PO TABS: 1.0000 | ORAL_TABLET | Freq: Four times a day (QID) | ORAL | Status: DC | PRN

## 2015-04-01 NOTE — Progress Notes (Signed)
Patient ID: [name], female   DOB: 1950-08-28, 64 y.o.   MRN: [id number]    Subjective:   Patient ID: [name] female   DOB: 09/18/1950 64 y.o.   MRN: [id number]  HPI: [name] is a 64 y.o. woman with past medical history of hypertension, non-insulin dependent Type 2 DM, hypokalemia, iron-deficiency anemia due to chronic GI blood loss requiring frequent IV iron infusions, chronic HCV infection s/p treatment, PCT, depression, CVA, and GERD who presents for follow-up of hypertension and abdominal pain.   She reports compliance with taking atenolol, HCTZ, lisinopril, and newly started amlodipine at last visit for hypertension. She has chronic right LE edema but denies headache, chest pain, or lightheadedness.    She completed treatment with Harvoni for chronic hepatitis C with last VL undetectable on 03/01/15. Her US abdomen on 12/30/14 revealed fibrosis score of some F3 and F4 with high risk of fibrosis and is to have screening every 6 months. She is undergoing Heptatitis A & B vaccination series.  Her blistering-type rash predominately on her upper extremities that is worse with sun exposure consistent with PCT with elevated serum and urine porphyrins has improved since being treated for hepatitis C. Dr Burr Medico discussed with her regarding limiting IV iron infusions. She has chronic iron-deficiency anemia due to chronic GI loss from AVM's and diverticulosis. Her last Hg and ferritin were stable on 03/28/15.    She had elective left hemicolectomy on 02/16/12 with abdominal hernia repair in January of this year that was complicated by wound dehiscence requiring prolonged wound vac placement. She saw Dr. Ninfa Linden on 12/30/14 with well-healing wound that was almost closed and instructed to do dressings only as needed. She has been receiving percocet 10-325 mg as needed for pain which she ran out of the end of October. She is to see Dr. Ninfa Linden again to discuss wound healing. She denies nausea,  vomiting, BRBPR, melena, or change in BM.She is passing gas normally.  Her vitamin D deficiency resolved after 8-week course of high dose vitamin D therapy. She is currently not on maintenance therapy. She denies recent fall or fracture.   She would like to have tdap vaccination.   Past Medical History  Diagnosis Date  . Hypertension   . Colon polyps   . Hypokalemic alkalosis   . Allergic rhinitis   . GERD (gastroesophageal reflux disease)   . Stroke [facility]) 2010    no residual problems  . Anemia, iron deficiency 05/03/2011    recieves iron infusions as well as periodic red blood cell transfusions  . GI bleeding     Has had bleeding from small bowel AVMs as well as from colon diverticulosis.  . Diverticulosis   . Difficulty sleeping     takes trazadone for sleep  . Peripheral vascular disease ([employer])   . Hepatitis C     Began Harvoni early 82016. completion date late 02/2015.   Marland Kitchen Anxiety   . Diabetes mellitus without complication ([place])   . PCT (porphyria cutanea tarda) ([place])    Current Outpatient Prescriptions  Medication Sig Dispense Refill  . amLODipine (NORVASC) 10 MG tablet Take 1 tablet (10 mg total) by mouth daily. 30 tablet 3  . aspirin EC 81 MG tablet Take 1 tablet (81 mg total) by mouth daily. 30 tablet 1  . atenolol (TENORMIN) 50 MG tablet TAKE 1 TABLET BY MOUTH EVERY MORNING 30 tablet 9  . cetirizine (ZYRTEC) 10 MG tablet Take 10 mg by mouth every  morning.     Marland Kitchen FLUoxetine (PROZAC) 40 MG capsule TAKE 1 CAPSULE BY MOUTH DAILY 30 capsule 5  . gabapentin (NEURONTIN) 400 MG capsule TAKE 2 CAPSULE BY MOUTH THREE TIMES A DAY 180 capsule 3  . hydrochlorothiazide (HYDRODIURIL) 25 MG tablet Take 1 tablet (25 mg total) by mouth every morning. 90 tablet 3  . Ledipasvir-Sofosbuvir (HARVONI) 90-400 MG TABS Take 1 tablet by mouth daily. 28 tablet 2  . lisinopril (PRINIVIL,ZESTRIL) 40 MG tablet Take 1 tablet (40 mg total) by mouth every morning. 90 tablet 4  . metFORMIN (GLUCOPHAGE)  500 MG tablet Take 500 mg by mouth daily with breakfast.    . omeprazole (PRILOSEC) 20 MG capsule Take 1 capsule (20 mg total) by mouth daily. 30 capsule 11  . oxyCODONE-acetaminophen (PERCOCET) 10-325 MG tablet Take 1 tablet by mouth every 6 (six) hours as needed for pain. 120 tablet 0  . potassium chloride 20 MEQ/15ML (10%) SOLN TAKE 15ML BY MOUTH EVERY DAY (Patient taking differently: Take 20 mEq by mouth daily. TAKE 15ML BY MOUTH EVERY DAY) 450 mL 1  . traZODone (DESYREL) 100 MG tablet Take 0.5 tablets (50 mg total) by mouth at bedtime. 30 tablet 5  . vitamin C (ASCORBIC ACID) 500 MG tablet Take 500 mg by mouth every morning.     No current facility-administered medications for this visit.   Family History  Problem Relation Age of Onset  . Cancer Father     unknown  . Hypertension Mother   . Diverticulitis Mother    Social History   Social History  . Marital Status: Widowed    Spouse Name: N/A  . Number of Children: 3  . Years of Education: N/A   Occupational History  . Retired    Social History Main Topics  . Smoking status: Current Every Day Smoker -- 0.20 packs/day for 17 years    Types: Cigarettes  . Smokeless tobacco: Never Used     Comment: wants to quit.  Smoking less 1 pk per week.  1-2 cigs per day  . Alcohol Use: 0.0 oz/week    0 Standard drinks or equivalent per week     Comment: Occasionally  . Drug Use: Yes    Special: Marijuana     Comment: occasional  . Sexual Activity: Yes    Birth Control/ Protection: Post-menopausal   Other Topics Concern  . None   Social History Narrative   Review of Systems: Review of Systems  Constitutional: Negative for weight loss.  Eyes: Positive for blurred vision (chronic).  Respiratory: Negative for cough, shortness of breath and wheezing.   Cardiovascular: Positive for leg swelling (chronic right LE). Negative for chest pain.  Gastrointestinal: Positive for abdominal pain (chronic since hernia repair). Negative for  nausea, vomiting, diarrhea, constipation, blood in stool and melena.  Genitourinary: Negative for dysuria, urgency and frequency.  Musculoskeletal: Negative for falls.  Skin: Positive for rash (PCT improved).  Neurological: Positive for speech change (chronic peripheral neuropathy). Negative for dizziness and headaches.     Objective:  Physical Exam: Filed Vitals:   04/01/15 1439  BP: 111/66  Pulse: 76  Temp: 98.7 F (37.1 C)  TempSrc: Oral  Weight: 173 lb 3.2 oz (78.563 kg)  SpO2: 99%    Physical Exam  Constitutional: She is oriented to person, place, and time. She appears well-developed and well-nourished. No distress.  In wheelchair  HENT:  Head: Normocephalic and atraumatic.  Right Ear: External ear normal.  Left Ear: External ear  normal.  Nose: Nose normal.  Mouth/Throat: Oropharynx is clear and moist.  Eyes: Conjunctivae are normal. Pupils are equal, round, and reactive to light. Right eye exhibits no discharge. Left eye exhibits no discharge. No scleral icterus.  Neck: Normal range of motion. Neck supple.  Cardiovascular: Normal rate, regular rhythm and normal heart sounds.   Pulmonary/Chest: Effort normal and breath sounds normal. No respiratory distress. She has no wheezes. She has no rales.  Abdominal: Soft. Bowel sounds are normal. She exhibits no distension. There is tenderness (at hernia site ). There is no rebound and no guarding.  Please see picture below  Musculoskeletal: Normal range of motion. She exhibits edema (trace right LE). She exhibits no tenderness.  S/p left BKA with prosthesis on  Neurological: She is alert and oriented to person, place, and time.  Skin: Skin is warm and dry. Rash (Improved PCT on b/l UE, right LE, and chest) noted. She is not diaphoretic. No erythema. No pallor.  Psychiatric: She has a normal mood and affect. Her behavior is normal. Judgment and thought content normal.          Assessment & Plan:   Please see problem  list for problem-based assessment and plan

## 2015-04-01 NOTE — Patient Instructions (Signed)
-  Will give you prescription for diabetic shoe and prosthetic cover -Start taking vitamin D 1000 U daily  -I refilled your percocet, please discuss with Dr. Ninfa Linden about why you are still having pain after the surgery -Will give you a tetanus shot today  -Please come back for a pap smear -Very nice seeing you and glad you are doing well! Please come back in February for diabetes check   General Instructions:   Please bring your medicines with you each time you come to clinic.  Medicines may include prescription medications, over-the-counter medications, herbal remedies, eye drops, vitamins, or other pills.   Progress Toward Treatment Goals:  Treatment Goal 05/14/2014  Blood pressure -  Stop smoking -  Prevent falls at goal    Self Care Goals & Plans:  Self Care Goal 04/01/2015  Manage my medications take my medicines as prescribed; refill my medications on time  Monitor my health -  Eat healthy foods -  Be physically active -  Stop smoking go to the [place] website ([email or web address].info/)  Prevent falls -    No flowsheet data found.   Care Management & Community Referrals:  Referral 05/14/2014  Referrals made for care management support [facility] case management program

## 2015-04-02 NOTE — Assessment & Plan Note (Signed)
Assessment: Pt with vitamin D deficiency with level of 10 on 11/30/14 with improvement to  37.2 on 02/23/15 after 8-week high dose vitamin D therapy who presents with no recent fall or fracture.   Plan:  -Start maintenance therapy with vitamin D cholecalciferol 000 U daily

## 2015-04-02 NOTE — Assessment & Plan Note (Signed)
-  Pt received tdap vaccination on 04/01/15 -Inquire about zoster vaccination at next visit -Pt reports having partial hysterectomy, will need pap smear testing to be scheduled at another visit

## 2015-04-02 NOTE — Assessment & Plan Note (Signed)
Assessment: Pt with moderately well-controlled hypertension compliant with four-class (BB, ACEi, CCB, and diuretic) anti-hypertensive therapy who presents with blood pressure of 111/66.   Plan:  -BP 111/66 at goal <140/90 -Continue amlodipine 10 mg daily, atenolol 50 mg daily, HCTZ 25 mg daily, and lisinopril 40 mg daily -Last CMP on 03/28/15 with mildly elevated Cr of 1.2, repeat at next visit

## 2015-04-02 NOTE — Assessment & Plan Note (Addendum)
Assessment: Pt with chronic hepatitis C diagnosed in 1997 with liver biopsy in 2007 with clinically grade 1 stage 1 disease with very early periportal fibrosis andlast US abdomen on 12/30/14 with fibrosis score of some F3 and F4 with high risk of fibrosis who is status post three months of harvoni therapy with undetectable load on 03/01/15.   Plan:  -Pt due for 2nd hepatitis A & 3rd hepatitis B vaccinations in March of 2017 -Pt has had pneumococcal vaccination in 2012 and influenza vaccination on 02/22/15 -Pt follows with GI, will need EGD to assess for esophageal varices and US abdomen every 6 months (February 2017) -Pt follows with ID Dr. Linus Salmons, with next appointment on 06/30/15

## 2015-04-02 NOTE — Assessment & Plan Note (Signed)
Assessment: Pt with porphyria cutanea tarda in setting of chronic hepatitis C infection and frequent IV iron infusions with evidence of elevated serum and urine porphyrins who presents with improved rash after finishing hepatitis C treatment.    Plan:  -Pt instructed to avoid sun exposure -Per Dr. Burr Medico to limit IV iron infusion unless severely iron deficient

## 2015-04-02 NOTE — Assessment & Plan Note (Signed)
Assessment: Pt with left hemicolectomy on 02/16/12 due to chronic blood loss from AVM's s/p ventral hernia repair January of 6948 complicated by wound dehiscence February of 2016 who presents with umbilical pain controlled with narcotic therapy.   Plan: -Pt to see surgeon Dr. Ninfa Linden for follow-up and discussion of persistent abdominal pain  -Refill percocet 10-325 mg Q 6 hr PRN pain #120, pt to limit tylenol to 2 g daily in setting of liver disease -Will need UDS at next refill

## 2015-04-04 ENCOUNTER — Other Ambulatory Visit: Payer: Self-pay | Admitting: Hematology

## 2015-04-04 DIAGNOSIS — D500 Iron deficiency anemia secondary to blood loss (chronic): Secondary | ICD-10-CM

## 2015-04-04 NOTE — Progress Notes (Signed)
Medicine attending: Medical history, presenting problems, physical findings, and medications, reviewed with Dr Marjan Rabbani on the day of the patient visit and I concur with her evaluation and management plan. 

## 2015-04-06 ENCOUNTER — Telehealth: Payer: Self-pay | Admitting: *Deleted

## 2015-04-06 NOTE — Telephone Encounter (Signed)
PER staff messages and POF I have scheduled appt. I have called and gave patient appt

## 2015-04-07 ENCOUNTER — Ambulatory Visit (HOSPITAL_BASED_OUTPATIENT_CLINIC_OR_DEPARTMENT_OTHER): Payer: Medicare Other

## 2015-04-07 ENCOUNTER — Other Ambulatory Visit: Payer: Self-pay | Admitting: Hematology

## 2015-04-07 ENCOUNTER — Other Ambulatory Visit: Payer: Self-pay | Admitting: *Deleted

## 2015-04-07 ENCOUNTER — Other Ambulatory Visit (HOSPITAL_BASED_OUTPATIENT_CLINIC_OR_DEPARTMENT_OTHER): Payer: Medicare Other

## 2015-04-07 ENCOUNTER — Ambulatory Visit (HOSPITAL_COMMUNITY)
Admission: RE | Admit: 2015-04-07 | Discharge: 2015-04-07 | Disposition: A | Discharge status: Home / Self Care | Payer: Medicare Other | Source: Ambulatory Visit | Attending: Hematology | Admitting: Hematology

## 2015-04-07 VITALS — BP 125/69 | HR 73 | Temp 98.7°F | Resp 17

## 2015-04-07 DIAGNOSIS — D500 Iron deficiency anemia secondary to blood loss (chronic): Secondary | ICD-10-CM

## 2015-04-07 DIAGNOSIS — B182 Chronic viral hepatitis C: Secondary | ICD-10-CM

## 2015-04-07 LAB — COMPREHENSIVE METABOLIC PANEL (CC13)
ALBUMIN: 3.4 g/dL — AB (ref 3.5–5.0)
ALK PHOS: 77 U/L (ref 40–150)
ALT: 18 U/L (ref 0–55)
ANION GAP: 9 meq/L (ref 3–11)
AST: 22 U/L (ref 5–34)
BUN: 12.6 mg/dL (ref 7.0–26.0)
CALCIUM: 9 mg/dL (ref 8.4–10.4)
CO2: 27 mEq/L (ref 22–29)
CREATININE: 0.9 mg/dL (ref 0.6–1.1)
Chloride: 104 mEq/L (ref 98–109)
EGFR: 81 mL/min/{1.73_m2} — ABNORMAL LOW (ref >90)
Glucose: 126 mg/dl (ref 70–140)
Potassium: 4 mEq/L (ref 3.5–5.1)
Sodium: 140 mEq/L (ref 136–145)
Total Bilirubin: <0.3 mg/dL (ref 0.20–1.20)
Total Protein: 7.1 g/dL (ref 6.4–8.3)

## 2015-04-07 LAB — CBC & DIFF AND RETIC
BASO%: 0.5 % (ref 0.0–2.0)
BASOS ABS: 0 10*3/uL (ref 0.0–0.1)
EOS ABS: 0.1 10*3/uL (ref 0.0–0.5)
EOS%: 1.7 % (ref 0.0–7.0)
HEMATOCRIT: 26.3 % — AB (ref 34.8–46.6)
HEMOGLOBIN: 8.2 g/dL — AB (ref 11.6–15.9)
Immature Retic Fract: 19.2 % — ABNORMAL HIGH (ref 1.60–10.00)
LYMPH#: 2.1 10*3/uL (ref 0.9–3.3)
LYMPH%: 32.3 % (ref 14.0–49.7)
MCH: 26 pg (ref 25.1–34.0)
MCHC: 31.2 g/dL — ABNORMAL LOW (ref 31.5–36.0)
MCV: 83.5 fL (ref 79.5–101.0)
MONO#: 0.9 10*3/uL (ref 0.1–0.9)
MONO%: 13.8 % (ref 0.0–14.0)
NEUT#: 3.3 10*3/uL (ref 1.5–6.5)
NEUT%: 51.7 % (ref 38.4–76.8)
PLATELETS: 341 10*3/uL (ref 145–400)
RBC: 3.15 10*6/uL — ABNORMAL LOW (ref 3.70–5.45)
RDW: 17.3 % — AB (ref 11.2–14.5)
RETIC %: 2.33 % — AB (ref 0.70–2.10)
RETIC CT ABS: 73.4 10*3/uL (ref 33.70–90.70)
WBC: 6.4 10*3/uL (ref 3.9–10.3)

## 2015-04-07 LAB — HOLD TUBE, BLOOD BANK

## 2015-04-07 LAB — PREPARE RBC (CROSSMATCH)

## 2015-04-07 MED ORDER — SODIUM CHLORIDE 0.9 % IV SOLN
510.0000 mg | Freq: Once | INTRAVENOUS | Status: AC
  Administered 2015-04-07: 510 mg via INTRAVENOUS
  Filled 2015-04-07: qty 17

## 2015-04-07 MED ORDER — DIPHENHYDRAMINE HCL 50 MG/ML IJ SOLN
INTRAMUSCULAR | Status: AC
  Filled 2015-04-07: qty 1

## 2015-04-07 MED ORDER — SODIUM CHLORIDE 0.9 % IV SOLN
Freq: Once | INTRAVENOUS | Status: AC
  Administered 2015-04-07: 16:00:00 via INTRAVENOUS

## 2015-04-07 MED ORDER — DIPHENHYDRAMINE HCL 50 MG/ML IJ SOLN
50.0000 mg | Freq: Once | INTRAMUSCULAR | Status: AC
  Administered 2015-04-07: 50 mg via INTRAVENOUS

## 2015-04-07 MED ORDER — FAMOTIDINE IN NACL 20-0.9 MG/50ML-% IV SOLN
20.0000 mg | Freq: Once | INTRAVENOUS | Status: AC
Start: 2015-04-07 — End: 2015-04-07
  Administered 2015-04-07: 20 mg via INTRAVENOUS

## 2015-04-07 MED ORDER — FAMOTIDINE IN NACL 20-0.9 MG/50ML-% IV SOLN
INTRAVENOUS | Status: AC
  Filled 2015-04-07: qty 50

## 2015-04-07 NOTE — Patient Instructions (Signed)

## 2015-04-08 ENCOUNTER — Other Ambulatory Visit: Payer: Self-pay | Admitting: *Deleted

## 2015-04-08 LAB — IRON AND TIBC CHCC
%SAT: 3 % — AB (ref 21–57)
Iron: 14 ug/dL — ABNORMAL LOW (ref 41–142)
TIBC: 453 ug/dL — AB (ref 236–444)
UIBC: 438 ug/dL — AB (ref 120–384)

## 2015-04-08 LAB — FERRITIN CHCC: FERRITIN: 11 ng/mL (ref 9–269)

## 2015-04-09 ENCOUNTER — Ambulatory Visit: Payer: Medicare Other

## 2015-04-09 VITALS — BP 159/84 | HR 87 | Temp 98.1°F | Resp 18

## 2015-04-09 DIAGNOSIS — D500 Iron deficiency anemia secondary to blood loss (chronic): Secondary | ICD-10-CM

## 2015-04-09 MED ORDER — SODIUM CHLORIDE 0.9 % IV SOLN
250.0000 mL | Freq: Once | INTRAVENOUS | Status: AC
  Administered 2015-04-09: 250 mL via INTRAVENOUS

## 2015-04-09 NOTE — Patient Instructions (Signed)

## 2015-04-10 LAB — TYPE AND SCREEN
ABO/RH(D): AB POS
Antibody Screen: NEGATIVE
Unit division: 0
Unit division: 0

## 2015-04-15 ENCOUNTER — Other Ambulatory Visit: Payer: Self-pay | Admitting: Internal Medicine

## 2015-04-22 ENCOUNTER — Other Ambulatory Visit (HOSPITAL_BASED_OUTPATIENT_CLINIC_OR_DEPARTMENT_OTHER): Payer: Medicare Other

## 2015-04-22 DIAGNOSIS — D500 Iron deficiency anemia secondary to blood loss (chronic): Secondary | ICD-10-CM

## 2015-04-22 DIAGNOSIS — B182 Chronic viral hepatitis C: Secondary | ICD-10-CM

## 2015-04-22 LAB — CBC & DIFF AND RETIC
BASO%: 0.9 % (ref 0.0–2.0)
BASOS ABS: 0.1 10*3/uL (ref 0.0–0.1)
EOS ABS: 0.1 10*3/uL (ref 0.0–0.5)
EOS%: 2 % (ref 0.0–7.0)
HEMATOCRIT: 35.9 % (ref 34.8–46.6)
HEMOGLOBIN: 11.6 g/dL (ref 11.6–15.9)
Immature Retic Fract: 5.7 % (ref 1.60–10.00)
LYMPH%: 24.9 % (ref 14.0–49.7)
MCH: 28.1 pg (ref 25.1–34.0)
MCHC: 32.3 g/dL (ref 31.5–36.0)
MCV: 87 fL (ref 79.5–101.0)
MONO#: 0.8 10*3/uL (ref 0.1–0.9)
MONO%: 11.5 % (ref 0.0–14.0)
NEUT#: 4 10*3/uL (ref 1.5–6.5)
NEUT%: 60.7 % (ref 38.4–76.8)
Platelets: 320 10*3/uL (ref 145–400)
RBC: 4.12 10*6/uL (ref 3.70–5.45)
RDW: 21.4 % — ABNORMAL HIGH (ref 11.2–14.5)
Retic %: 1.99 % (ref 0.70–2.10)
Retic Ct Abs: 81.99 10*3/uL (ref 33.70–90.70)
WBC: 6.6 10*3/uL (ref 3.9–10.3)
lymph#: 1.7 10*3/uL (ref 0.9–3.3)

## 2015-04-22 LAB — COMPREHENSIVE METABOLIC PANEL (CC13)
ALBUMIN: 3.5 g/dL (ref 3.5–5.0)
ALK PHOS: 78 U/L (ref 40–150)
ALT: 29 U/L (ref 0–55)
AST: 33 U/L (ref 5–34)
Anion Gap: 9 mEq/L (ref 3–11)
BUN: 25.7 mg/dL (ref 7.0–26.0)
CALCIUM: 9 mg/dL (ref 8.4–10.4)
CO2: 27 mEq/L (ref 22–29)
Chloride: 106 mEq/L (ref 98–109)
Creatinine: 1.2 mg/dL — ABNORMAL HIGH (ref 0.6–1.1)
EGFR: 56 mL/min/{1.73_m2} — AB (ref >90)
Glucose: 126 mg/dl (ref 70–140)
POTASSIUM: 4.3 meq/L (ref 3.5–5.1)
Sodium: 141 mEq/L (ref 136–145)
Total Bilirubin: <0.3 mg/dL (ref 0.20–1.20)
Total Protein: 7.1 g/dL (ref 6.4–8.3)

## 2015-04-22 LAB — IRON AND TIBC CHCC
%SAT: 17 % — AB (ref 21–57)
IRON: 65 ug/dL (ref 41–142)
TIBC: 378 ug/dL (ref 236–444)
UIBC: 312 ug/dL (ref 120–384)

## 2015-04-22 LAB — FERRITIN CHCC: Ferritin: 98 ng/ml (ref 9–269)

## 2015-04-26 ENCOUNTER — Other Ambulatory Visit: Payer: Self-pay | Admitting: Internal Medicine

## 2015-04-26 DIAGNOSIS — Z9889 Other specified postprocedural states: Principal | ICD-10-CM

## 2015-04-26 DIAGNOSIS — Z8719 Personal history of other diseases of the digestive system: Secondary | ICD-10-CM

## 2015-04-26 NOTE — Telephone Encounter (Signed)
Last refill from office 11/4 Last visit 11/4 No scheduled f/u visit Last uds 10/6 PLEASE REVIEW RESULTS

## 2015-04-26 NOTE — Telephone Encounter (Signed)
Pt requesting percocet to be filled. °

## 2015-04-27 NOTE — Telephone Encounter (Signed)
Patient calling for a refill on her Percocet and would like for someone to call her back today.

## 2015-04-28 ENCOUNTER — Other Ambulatory Visit: Payer: Medicare Other

## 2015-04-28 DIAGNOSIS — Z79891 Long term (current) use of opiate analgesic: Secondary | ICD-10-CM

## 2015-04-28 MED ORDER — OXYCODONE-ACETAMINOPHEN 10-325 MG PO TABS: 1.0000 | ORAL_TABLET | Freq: Four times a day (QID) | ORAL | Status: DC | PRN

## 2015-04-28 NOTE — Telephone Encounter (Signed)
Pt aware Rx is ready. Hilda Blades Morgan Rennert RN 04/28/15 8:45AM

## 2015-05-02 DIAGNOSIS — T8189XD Other complications of procedures, not elsewhere classified, subsequent encounter: Secondary | ICD-10-CM | POA: Diagnosis not present

## 2015-05-05 ENCOUNTER — Encounter: Payer: Self-pay | Admitting: Hematology

## 2015-05-05 ENCOUNTER — Other Ambulatory Visit: Payer: Medicare Other

## 2015-05-05 ENCOUNTER — Encounter: Payer: Medicare Other | Admitting: Hematology

## 2015-05-05 ENCOUNTER — Telehealth: Payer: Self-pay | Admitting: Hematology

## 2015-05-05 NOTE — Telephone Encounter (Signed)
s.w. pt and r/s appt...done....pt ok adn aware of new d.t

## 2015-05-05 NOTE — Progress Notes (Signed)
This encounter was created in error - please disregard.

## 2015-05-06 LAB — TOXASSURE SELECT,+ANTIDEPR,UR: PDF: 0

## 2015-05-16 ENCOUNTER — Other Ambulatory Visit: Payer: Self-pay | Admitting: Internal Medicine

## 2015-05-26 DIAGNOSIS — T8189XD Other complications of procedures, not elsewhere classified, subsequent encounter: Secondary | ICD-10-CM | POA: Diagnosis not present

## 2015-05-30 ENCOUNTER — Encounter: Payer: Self-pay | Admitting: Internal Medicine

## 2015-05-31 ENCOUNTER — Encounter: Payer: Self-pay | Admitting: Internal Medicine

## 2015-06-01 ENCOUNTER — Other Ambulatory Visit: Payer: Self-pay | Admitting: *Deleted

## 2015-06-01 DIAGNOSIS — D500 Iron deficiency anemia secondary to blood loss (chronic): Secondary | ICD-10-CM

## 2015-06-01 DIAGNOSIS — K31819 Angiodysplasia of stomach and duodenum without bleeding: Secondary | ICD-10-CM

## 2015-06-02 ENCOUNTER — Telehealth: Payer: Self-pay | Admitting: Hematology

## 2015-06-02 ENCOUNTER — Other Ambulatory Visit (HOSPITAL_BASED_OUTPATIENT_CLINIC_OR_DEPARTMENT_OTHER): Payer: Medicare Other

## 2015-06-02 ENCOUNTER — Ambulatory Visit (HOSPITAL_BASED_OUTPATIENT_CLINIC_OR_DEPARTMENT_OTHER): Payer: Medicare Other | Admitting: Hematology

## 2015-06-02 ENCOUNTER — Encounter: Payer: Self-pay | Admitting: Hematology

## 2015-06-02 DIAGNOSIS — E119 Type 2 diabetes mellitus without complications: Secondary | ICD-10-CM

## 2015-06-02 DIAGNOSIS — B1920 Unspecified viral hepatitis C without hepatic coma: Secondary | ICD-10-CM | POA: Diagnosis not present

## 2015-06-02 DIAGNOSIS — Z720 Tobacco use: Secondary | ICD-10-CM | POA: Diagnosis not present

## 2015-06-02 DIAGNOSIS — B182 Chronic viral hepatitis C: Secondary | ICD-10-CM

## 2015-06-02 DIAGNOSIS — K31819 Angiodysplasia of stomach and duodenum without bleeding: Secondary | ICD-10-CM

## 2015-06-02 DIAGNOSIS — D500 Iron deficiency anemia secondary to blood loss (chronic): Secondary | ICD-10-CM

## 2015-06-02 LAB — CBC WITH DIFFERENTIAL/PLATELET
BASO%: 0.3 % (ref 0.0–2.0)
BASOS ABS: 0 10*3/uL (ref 0.0–0.1)
EOS ABS: 0.1 10*3/uL (ref 0.0–0.5)
EOS%: 1.4 % (ref 0.0–7.0)
HCT: 34.6 % — ABNORMAL LOW (ref 34.8–46.6)
HEMOGLOBIN: 11.1 g/dL — AB (ref 11.6–15.9)
LYMPH%: 27.6 % (ref 14.0–49.7)
MCH: 27.2 pg (ref 25.1–34.0)
MCHC: 32.1 g/dL (ref 31.5–36.0)
MCV: 84.8 fL (ref 79.5–101.0)
MONO#: 1 10*3/uL — ABNORMAL HIGH (ref 0.1–0.9)
MONO%: 14.1 % — AB (ref 0.0–14.0)
NEUT#: 4.2 10*3/uL (ref 1.5–6.5)
NEUT%: 56.6 % (ref 38.4–76.8)
Platelets: 212 10*3/uL (ref 145–400)
RBC: 4.08 10*6/uL (ref 3.70–5.45)
RDW: 17 % — AB (ref 11.2–14.5)
WBC: 7.3 10*3/uL (ref 3.9–10.3)
lymph#: 2 10*3/uL (ref 0.9–3.3)

## 2015-06-02 LAB — COMPREHENSIVE METABOLIC PANEL
ALT: 28 U/L (ref 0–55)
ANION GAP: 9 meq/L (ref 3–11)
AST: 31 U/L (ref 5–34)
Albumin: 3.6 g/dL (ref 3.5–5.0)
Alkaline Phosphatase: 87 U/L (ref 40–150)
BUN: 12.3 mg/dL (ref 7.0–26.0)
CO2: 27 meq/L (ref 22–29)
CREATININE: 1 mg/dL (ref 0.6–1.1)
Calcium: 8.9 mg/dL (ref 8.4–10.4)
Chloride: 106 mEq/L (ref 98–109)
EGFR: 74 mL/min/{1.73_m2} — ABNORMAL LOW (ref >90)
Glucose: 99 mg/dl (ref 70–140)
Potassium: 3.9 mEq/L (ref 3.5–5.1)
Sodium: 142 mEq/L (ref 136–145)
TOTAL PROTEIN: 7.5 g/dL (ref 6.4–8.3)

## 2015-06-02 NOTE — Progress Notes (Signed)
[facility] OFFICE PROGRESS NOTE   [name], MD [place] [id number]  DIAGNOSIS: Porphyria cutanea tarda ([place])  Iron deficiency anemia due to chronic blood loss.   PROBLEM LIST:  1. Recurrent iron-deficiency anemia secondary to gastrointestinal bleeding (as noted above). [name] also has required red cell transfusions in the past, most recently early December 2012. She underwent a small bowel enteroscopy by Dr. Dan Humphreys at [facility] on 08/13/2011. The enteroscope was passed up to 6 feet into the jejunum. He saw multiple AVMs, which were ablated. Two of the large AVMs bled during APC. These were in the 4th part of the duodenum very close to the ligament of Treitz. The remainder of the AVMs were in the proximal jejunum. In addition to the small bowel AV malformations, which were treated with APC, he saw some small esophageal varices which were not bleeding and also evidence of mild portal hypertensive gastropathy.  2. History of duodenal arteriovenous malformation.  3. History of hepatitis C infection.  4. Gastroesophageal reflux disease.  5. Depression.  6. Hypertension.  7. History of right Bell's palsy.  8. Status post left above-knee amputation secondary to necrotizing fasciitis 03/06/2009.  9. Neurodermatitis.  10. Previous history of alcohol usage.  11. Admission to the hospital from 12/20/2011 through 12/25/2011 for hematochezia felt to be secondary to a diverticular bleed, requiring 2 units of packed red cells.  12. Diverticulosis.  13. Elevated alpha fetoprotein, 72.3 on 11/19/2011 and 76.0 on 01/11/2012.  14. Abnormal MRI of the liver from 11/28/2011 showing diffuse and markedly low signal intensity throughout the liver as well as diffuse low signal intensity in the adrenal glands. 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 There were no findings for  cirrhosis, portal hypertension, splenomegaly or ascites, not were there any worrisome enhancing liver lesions.  15. Systolic ejection murmur.   CHIEF COMPLAIN: Follow up anemia  PRIOR TREATMENT: The patient has been heavily dependent upon IV Feraheme. Over the past year or so she has been receiving about 1 Feraheme infusion monthly on average. The patient's most recent infusions of IV Feraheme 510 mg occurred on the following dates: 06/05/2011, 07/03/2011, 07/31/2011, 08/17/2011, 08/28/2011. The patient received IV Feraheme 1020 mg on 10/02/2011 01/30/2012, 06/06/2012 and 09/10/2012, 05/2013, 07/2013, 08/2013, 09/2013, 11/12/2013, 01/15/2014 and 03/16/2014, 09/16/2014 and 09/23/2014, 11/2014 , 02/24/2015,  04/07/2015  CURRENT TREATMENT: As noted above. Feraheme prn, will be cautious for iron supplement due to PCT  INTERVAL HISTORY:  [name] 65 y.o. female with history of recurrent iron-deficiency anemia due to GI bleeding is here for follow-up. GI bleeding is probably due to AV malformations and diverticulosis. She is accompanied by her daughter to clinic today.    She has been doing well lately. She denies any significant episodes of GI bleeding. Her energy level has improved in the past few months. She bumped her right lower extremity yesterday, and noticed a small skin ulcer on the right lower extremity below-knee. She denies any fever, skin erythema, or other new symptoms. Her abdominal wound has healed well, she'll follow-up with her surgeon.  MEDICAL HISTORY: Past Medical History  Diagnosis Date  . Hypertension   . Colon polyps   . Hypokalemic alkalosis   . Allergic rhinitis   . GERD (gastroesophageal reflux disease)   . Stroke [facility]) 2010    no residual problems  . Anemia, iron deficiency 05/03/2011    recieves iron  infusions as well as periodic red blood cell transfusions  . GI bleeding     Has had bleeding from small bowel AVMs as well as from colon diverticulosis.  .  Diverticulosis   . Difficulty sleeping     takes trazadone for sleep  . Peripheral vascular disease ([place])   . Hepatitis C     Began Harvoni early 82016. completion date late 02/2015.   Marland Kitchen Anxiety   . Diabetes mellitus without complication ([place])   . PCT (porphyria cutanea tarda) ([facility])      ALLERGIES:  is allergic to ciprofloxacin; morphine and related; penicillins; codeine; and feraheme.  MEDICATIONS: has a current medication list which includes the following prescription(s): amlodipine, aspirin ec, atenolol, cetirizine, fluoxetine, gabapentin, hydrochlorothiazide, lisinopril, omeprazole, oxycodone-acetaminophen, potassium chloride, trazodone, vitamin c, and vitamin d (cholecalciferol).  SURGICAL HISTORY:  Past Surgical History  Procedure Laterality Date  . Leg amputation above knee    . Esophagogastroduodenoscopy  05/05/2011    Procedure: ESOPHAGOGASTRODUODENOSCOPY (EGD);  Surgeon: Zenovia Jarred, MD;  Location: [facility];  Service: Gastroenterology;  Laterality: N/A;  Dr. Hilarie Fredrickson will do procedure for Dr. Benson Norway Saturday.  . Esophagogastroduodenoscopy  05/08/2011    Procedure: ESOPHAGOGASTRODUODENOSCOPY (EGD);  Surgeon: Beryle Beams;  Location: [facility];  Service: Endoscopy;  Laterality: N/A;  . Esophagogastroduodenoscopy  06/07/2011    Procedure: ESOPHAGOGASTRODUODENOSCOPY (EGD);  Surgeon: Beryle Beams, MD;  Location: [facility];  Service: Endoscopy;  Laterality: N/A;  . Hot hemostasis  06/07/2011    Procedure: HOT HEMOSTASIS (ARGON PLASMA COAGULATION/BICAP);  Surgeon: Beryle Beams, MD;  Location: [facility];  Service: Endoscopy;  Laterality: N/A;  . Esophagogastroduodenoscopy  12/20/2011    Procedure: ESOPHAGOGASTRODUODENOSCOPY (EGD);  Surgeon: Beryle Beams, MD;  Location: [facility];  Service: Endoscopy;  Laterality: N/A;  . Flexible sigmoidoscopy  12/21/2011    Procedure: FLEXIBLE SIGMOIDOSCOPY;  Surgeon: Beryle Beams, MD;  Location: [facility];  Service: Endoscopy;   Laterality: N/A;  . Carpal tunnel release      rt hand  . Partial colectomy  02/15/2012    Procedure: PARTIAL COLECTOMY;  Surgeon: Harl Bowie, MD;  Location: [facility];  Service: General;  Laterality: N/A;  . Laparotomy  02/16/2012    Procedure: EXPLORATORY LAPAROTOMY;  Surgeon: Edward Jolly, MD;  Location: [facility];  Service: General;  Laterality: N/A;  oversewing of anastomotic leak and rigid sigmoidoscopy  . Enteroscopy N/A 12/04/2012    Procedure: ENTEROSCOPY;  Surgeon: Beryle Beams, MD;  Location: [facility];  Service: Endoscopy;  Laterality: N/A;  . Enteroscopy N/A 12/18/2012    Procedure: ENTEROSCOPY;  Surgeon: Beryle Beams, MD;  Location: [facility];  Service: Endoscopy;  Laterality: N/A;  . Eye surgery Bilateral     cataracts  . Tonsillectomy    . Abdominal hysterectomy    . Insertion of mesh N/A 06/01/2014    Procedure: INSERTION OF MESH;  Surgeon: Coralie Keens, MD;  Location: [facility];  Service: General;  Laterality: N/A;  . Incisional hernia repair N/A 06/01/2014    Procedure: ATTEMPTED LAPAROSCOPIC AND OPEN INCISIONAL HERNIA REPAIR WITH MESH;  Surgeon: Coralie Keens, MD;  Location: [facility];  Service: General;  Laterality: N/A;  . Laparotomy N/A 06/21/2014    Procedure: ABDOMINAL WOUND EXPLORATION;  Surgeon: Coralie Keens, MD;  Location: [facility];  Service: General;  Laterality: N/A;  . Application of wound vac N/A 06/21/2014    Procedure: APPLICATION OF WOUND VAC;  Surgeon: Coralie Keens, MD;  Location: [facility];  Service: General;  Laterality: N/A;  . Enteroscopy N/A 02/25/2015    Procedure: ENTEROSCOPY;  Surgeon: Inda Castle, MD;  Location: [facility];  Service: Endoscopy;  Laterality: N/A;     Medication List       This list is accurate as of: 06/02/15  4:32 PM.  Always use your most recent med list.               amLODipine 10 MG tablet  Commonly known as:  NORVASC  TAKE 1 TABLET BY MOUTH EVERY DAY     aspirin EC 81 MG tablet  Take 1 tablet (81  mg total) by mouth daily.     atenolol 50 MG tablet  Commonly known as:  TENORMIN  TAKE 1 TABLET BY MOUTH EVERY MORNING     cetirizine 10 MG tablet  Commonly known as:  ZYRTEC  TAKE 1 TABLET BY MOUTH DAILY     FLUoxetine 40 MG capsule  Commonly known as:  PROZAC  TAKE 1 CAPSULE BY MOUTH DAILY     gabapentin 400 MG capsule  Commonly known as:  NEURONTIN  TAKE 2 CAPSULES BY MOUTH THREE TIMES A DAY     hydrochlorothiazide 25 MG tablet  Commonly known as:  HYDRODIURIL  Take 1 tablet (25 mg total) by mouth every morning.     lisinopril 40 MG tablet  Commonly known as:  PRINIVIL,ZESTRIL  Take 1 tablet (40 mg total) by mouth every morning.     omeprazole 20 MG capsule  Commonly known as:  PRILOSEC  Take 1 capsule (20 mg total) by mouth daily.     oxyCODONE-acetaminophen 10-325 MG tablet  Commonly known as:  PERCOCET  Take 1 tablet by mouth every 6 (six) hours as needed for pain.     potassium chloride 20 MEQ/15ML (10%) Soln  TAKE 15ML BY MOUTH EVERY DAY     traZODone 100 MG tablet  Commonly known as:  DESYREL  Take 0.5 tablets (50 mg total) by mouth at bedtime.     vitamin C 500 MG tablet  Commonly known as:  ASCORBIC ACID  Take 500 mg by mouth every morning.     Vitamin D (Cholecalciferol) 1000 units Tabs  Take 1 tablet daily         REVIEW OF SYSTEMS:   Constitutional: Denies fevers, chills or abnormal weight loss; Reports mild fatigue.  Eyes: Denies blurriness of vision Ears, nose, mouth, throat, and face: Denies mucositis or sore throat Respiratory: Denies cough, dyspnea or wheezes Cardiovascular: Denies palpitation, chest discomfort or lower extremity swelling Gastrointestinal:  Denies nausea, heartburn or change in bowel habits, hernia is painful and bleeds sometime the overlying skin. Skin: Denies abnormal skin rashes Lymphatics: Denies new lymphadenopathy or easy bruising Neurological:Denies numbness, tingling or new weaknesses Behavioral/Psych: Mood is  stable, no new changes  All other systems were reviewed with the patient and are negative.  PHYSICAL EXAMINATION: ECOG PERFORMANCE STATUS: 2 Weight 179 pounds, blood pressure 194/76, heart rate 77, respiratory rate 18, temperature 98.4, pulse ox 99% on room air. General appearance: alert, appears stated age, no distress and moderately obese  Skin: she has multiple skin pigmentations from previous rash on her arms and legs. She is a 1cm size shadow skin ulcer in the right lower extremity below knee, dry, no discharge, no surrounding skin erythema, and nontender. Head: Normocephalic, without obvious abnormality, atraumatic  Neck: no adenopathy, supple, symmetrical, trachea midline and thyroid not enlarged, symmetric, no tenderness/mass/nodules  Lymph nodes: Cervical adenopathy:  None appreciated  Heart:regular rate and rhythm, S1, S2 normal and systolic murmur: systolic ejection 2/6, harsh and previously documented without radiation  Lung:chest clear, no wheezing, rales, normal symmetric air entry, Heart exam - S1, S2 normal, no murmur, no gallop, rate regular  Abdomin: soft, distended, normal bowel sounds, nontender, (+) abdominal surgical wound is covered and has a wound vacuum.   EXT:No peripheral edema on right extremity; Left above the knee amputation. She is wearing a prothesis.   Labs:  CBC Latest Ref Rng 06/02/2015 04/22/2015 04/07/2015  WBC 3.9 - 10.3 10e3/uL 7.3 6.6 6.4  Hemoglobin 11.6 - 15.9 g/dL 11.1(L) 11.6 8.2(L)  Hematocrit 34.8 - 46.6 % 34.6(L) 35.9 26.3(L)  Platelets 145 - 400 10e3/uL 212 320 341    CMP Latest Ref Rng 06/02/2015 04/22/2015 04/07/2015  Glucose 70 - 140 mg/dl 99 126 126  BUN 7.0 - 26.0 mg/dL 12.3 25.7 12.6  Creatinine 0.6 - 1.1 mg/dL 1.0 1.2(H) 0.9  Sodium 136 - 145 mEq/L 142 141 140  Potassium 3.5 - 5.1 mEq/L 3.9 4.3 4.0  Chloride 101 - 111 mmol/L - - -  CO2 22 - 29 mEq/L 27 27 27   Calcium 8.4 - 10.4 mg/dL 8.9 9.0 9.0  Total Protein 6.4 - 8.3 g/dL 7.5 7.1  7.1  Total Bilirubin 0.20 - 1.20 mg/dL <0.30 <0.30 <0.30  Alkaline Phos 40 - 150 U/L 87 78 77  AST 5 - 34 U/L 31 33 22  ALT 0 - 55 U/L 28 29 18    Results for ANAELI, KEMPEN (MRN [id number]) as of 06/02/2015 15:57  Ref. Range 04/22/2015 14:09  Iron Latest Ref Range: 41-142 ug/dL 65  UIBC Latest Ref Range: 120-384 ug/dL 312  TIBC Latest Ref Range: 236-444 ug/dL 378  %SAT Latest Ref Range: 21-57 % 17 (L)  Ferritin Latest Ref Range: 9-269 ng/ml 98    RADIOGRAPHIC STUDIES: Mm Digital Screening Bilateral  07/31/2013   CLINICAL DATA:  Screening.  EXAM: DIGITAL SCREENING BILATERAL MAMMOGRAM WITH CAD  COMPARISON:  Previous exam(s).  ACR Breast Density Category c: The breast tissue is heterogeneously dense, which may obscure small masses.  FINDINGS: There are no findings suspicious for malignancy. Images were processed with CAD.  IMPRESSION: No mammographic evidence of malignancy. A result letter of this screening mammogram will be mailed directly to the patient.  RECOMMENDATION: Screening mammogram in one year. (Code:SM-B-01Y)  BI-RADS CATEGORY  1: Negative.   Electronically Signed   By: Abelardo Diesel M.D.   On: 07/31/2013 09:01   ASSESSMENT: [name] 65 y.o. female with a history of Porphyria cutanea tarda ([place])  Iron deficiency anemia due to chronic blood loss   PLAN:  1. Iron deficiency anemia secondary to GI loss -History of multiple GI bleeding in 2016, required blood transfusion, hospitalization and IV Feraheme -Doing better lately, her hemoglobin has been stable, no noticeable GI bleeding. -I recommend close follow-up, we'll check her CBC and Feraheme monthly  2. Porphyria cutaneous tarda (PCT) -per pt, she had significant sun sensitivity which caused skin blisters on her arms and legs, she has multiple skin pigmentations from healed skin rash on her arms. -Both her urine and serum porphyria, especially uroporphyrin and coproporphyrin all elevated.  -This is likely secondary to  her hepatitis C -She is currently being treated for hepatitis C. Hopefully her PCT will be improved after her hep c treatment    3. chronic hepatitis C infection -She was tested positive for hepatitis C in 2009. She is currently  on treated for hepatitis C.  -liver function has improved   4. DM2. --Management per PCP's office.   5. Left BKA -She has a prosthesis, she is wheelchair bound most of time, I encouraged her to continue physical therapy,   6. Smoking cessation -She is still actively smoking, I highly encourage her to quit smoking.  Plan  -Lab with CBC and ferritin every month, she will call me after lab test, I would consider IV Feraheme if her anemia gets worse along with low ferritin level  -I will see her back in 3 months -she will follow up with her PCP, ID and surgeon   All questions were answered. The patient knows to call the clinic with any problems, questions or concerns. We can certainly see the patient much sooner if necessary.  I spent 20 minutes counseling the patient face to face. The total time spent in the appointment was 25 minutes.  Truitt Merle 06/02/2015

## 2015-06-02 NOTE — Telephone Encounter (Signed)
Gv pt appts for Feb - April.

## 2015-06-03 LAB — IRON AND TIBC
%SAT: 5 % — AB (ref 21–57)
Iron: 23 ug/dL — ABNORMAL LOW (ref 41–142)
TIBC: 459 ug/dL — ABNORMAL HIGH (ref 236–444)
UIBC: 436 ug/dL — ABNORMAL HIGH (ref 120–384)

## 2015-06-03 LAB — FERRITIN: Ferritin: 10 ng/ml (ref 9–269)

## 2015-06-21 ENCOUNTER — Other Ambulatory Visit: Payer: Self-pay | Admitting: Internal Medicine

## 2015-06-30 ENCOUNTER — Telehealth: Payer: Self-pay | Admitting: Hematology

## 2015-06-30 ENCOUNTER — Other Ambulatory Visit: Payer: Medicare Other

## 2015-06-30 ENCOUNTER — Ambulatory Visit: Payer: Medicare Other | Admitting: Internal Medicine

## 2015-06-30 NOTE — Telephone Encounter (Signed)
Patients caregiver called to reschedule todays labs as the patient was not feeling well and req to r/s for two weeks  anne

## 2015-07-14 ENCOUNTER — Telehealth: Payer: Self-pay | Admitting: Internal Medicine

## 2015-07-14 ENCOUNTER — Other Ambulatory Visit (HOSPITAL_BASED_OUTPATIENT_CLINIC_OR_DEPARTMENT_OTHER): Payer: Medicare Other

## 2015-07-14 DIAGNOSIS — D500 Iron deficiency anemia secondary to blood loss (chronic): Secondary | ICD-10-CM | POA: Diagnosis present

## 2015-07-14 DIAGNOSIS — K31819 Angiodysplasia of stomach and duodenum without bleeding: Secondary | ICD-10-CM

## 2015-07-14 LAB — COMPREHENSIVE METABOLIC PANEL
ALT: 20 U/L (ref 0–55)
AST: 26 U/L (ref 5–34)
Albumin: 3.4 g/dL — ABNORMAL LOW (ref 3.5–5.0)
Alkaline Phosphatase: 88 U/L (ref 40–150)
Anion Gap: 10 mEq/L (ref 3–11)
BUN: 18 mg/dL (ref 7.0–26.0)
CHLORIDE: 106 meq/L (ref 98–109)
CO2: 26 meq/L (ref 22–29)
CREATININE: 0.9 mg/dL (ref 0.6–1.1)
Calcium: 8.6 mg/dL (ref 8.4–10.4)
EGFR: 82 mL/min/{1.73_m2} — ABNORMAL LOW (ref >90)
Glucose: 98 mg/dl (ref 70–140)
POTASSIUM: 3.8 meq/L (ref 3.5–5.1)
SODIUM: 142 meq/L (ref 136–145)
Total Protein: 7.4 g/dL (ref 6.4–8.3)

## 2015-07-14 LAB — CBC WITH DIFFERENTIAL/PLATELET
BASO%: 0.5 % (ref 0.0–2.0)
BASOS ABS: 0 10*3/uL (ref 0.0–0.1)
EOS%: 1.2 % (ref 0.0–7.0)
Eosinophils Absolute: 0.1 10*3/uL (ref 0.0–0.5)
HCT: 30.3 % — ABNORMAL LOW (ref 34.8–46.6)
HGB: 9.6 g/dL — ABNORMAL LOW (ref 11.6–15.9)
LYMPH%: 24.3 % (ref 14.0–49.7)
MCH: 25.9 pg (ref 25.1–34.0)
MCHC: 31.8 g/dL (ref 31.5–36.0)
MCV: 81.2 fL (ref 79.5–101.0)
MONO#: 1 10*3/uL — ABNORMAL HIGH (ref 0.1–0.9)
MONO%: 13.8 % (ref 0.0–14.0)
NEUT#: 4.5 10*3/uL (ref 1.5–6.5)
NEUT%: 60.2 % (ref 38.4–76.8)
Platelets: 304 10*3/uL (ref 145–400)
RBC: 3.73 10*6/uL (ref 3.70–5.45)
RDW: 18.8 % — ABNORMAL HIGH (ref 11.2–14.5)
WBC: 7.5 10*3/uL (ref 3.9–10.3)
lymph#: 1.8 10*3/uL (ref 0.9–3.3)

## 2015-07-14 NOTE — Telephone Encounter (Signed)
APPT. REMINDER CALL, NO ANSWER, NO VOICE MAIL °

## 2015-07-15 ENCOUNTER — Ambulatory Visit (INDEPENDENT_AMBULATORY_CARE_PROVIDER_SITE_OTHER): Payer: Medicare Other | Admitting: Internal Medicine

## 2015-07-15 ENCOUNTER — Encounter: Payer: Self-pay | Admitting: Internal Medicine

## 2015-07-15 VITALS — BP 150/72 | HR 79 | Temp 98.1°F | Wt 189.6 lb

## 2015-07-15 DIAGNOSIS — Z79899 Other long term (current) drug therapy: Secondary | ICD-10-CM

## 2015-07-15 DIAGNOSIS — I10 Essential (primary) hypertension: Secondary | ICD-10-CM | POA: Diagnosis not present

## 2015-07-15 DIAGNOSIS — Z9889 Other specified postprocedural states: Secondary | ICD-10-CM

## 2015-07-15 DIAGNOSIS — F17210 Nicotine dependence, cigarettes, uncomplicated: Secondary | ICD-10-CM | POA: Diagnosis not present

## 2015-07-15 DIAGNOSIS — Z8719 Personal history of other diseases of the digestive system: Secondary | ICD-10-CM | POA: Diagnosis not present

## 2015-07-15 DIAGNOSIS — E1142 Type 2 diabetes mellitus with diabetic polyneuropathy: Secondary | ICD-10-CM

## 2015-07-15 DIAGNOSIS — Z9049 Acquired absence of other specified parts of digestive tract: Secondary | ICD-10-CM | POA: Diagnosis not present

## 2015-07-15 LAB — GLUCOSE, CAPILLARY: GLUCOSE-CAPILLARY: 112 mg/dL — AB (ref 65–99)

## 2015-07-15 LAB — POCT GLYCOSYLATED HEMOGLOBIN (HGB A1C): Hemoglobin A1C: 6

## 2015-07-15 NOTE — Patient Instructions (Addendum)
-  Your A1c is still good at 6 keep up the good work!  -Will refer you to wound care  -Will write the prescriptions for your leg and glucerna  -Please ask Dr. Ninfa Linden about prescribing tramadol  -Very nice seeing you, please come back in 4-6 months  General Instructions:   Please bring your medicines with you each time you come to clinic.  Medicines may include prescription medications, over-the-counter medications, herbal remedies, eye drops, vitamins, or other pills.   Progress Toward Treatment Goals:  Treatment Goal 05/14/2014  Blood pressure -  Stop smoking -  Prevent falls at goal    Self Care Goals & Plans:  Self Care Goal 04/01/2015  Manage my medications take my medicines as prescribed; refill my medications on time  Monitor my health -  Eat healthy foods -  Be physically active -  Stop smoking go to the [employer] ([email or web address].info/)    No flowsheet data found.   Care Management & Community Referrals:  Referral 05/14/2014  Referrals made for care management support [facility] case management program

## 2015-07-15 NOTE — Progress Notes (Signed)
Patient ID: [name], female   DOB: 1951/02/18, 65 y.o.   MRN: [id number]    Subjective:   Patient ID: [name] female   DOB: 1950-10-19 65 y.o.   MRN: [id number]  HPI: [name] is a 65 y.o.  woman with past medical history of hypertension, non-insulin dependent Type 2 DM, hypokalemia, iron-deficiency anemia due to chronic GI blood loss requiring frequent IV iron infusions, chronic HCV infection s/p treatment, PCT, depression, CVA, and GERD who presents for follow-up of diabetes.   Her last A1c was 5.3 on 11/30/14 which is diet controlled. She does not check her blood sugars at home. She denies symptomatic hypoglycemia. She has chronic blurry vision, polyphagia, and chronic peripheral neuropathy but denies polydipsia or right foot injury/ulcer. She would like to have revision of her left leg prothesis. Her last eye exam on 12/16/14 revealed no evidence of retinopathy. She is unable to exercise due to left BKA but tries to follow a healthy diet. She reports her weight has been stable but has decreased appetite and would like to try glucerna shakes.    She reports compliance with taking atenolol, HCTZ, and lisinopril for hypertension but is not taking amlodipine due to side effect of nausea. She has chronic right LE edema and occasional headache but denies chest pain or lightheadedness.   She had elective left hemicolectomy on 02/16/12 with abdominal hernia repair in January of this year that was complicated by wound dehiscence requiring prolonged wound vac placement. She saw Dr. Ninfa Linden on 12/30/14 with well-healing wound that was almost closed and instructed to apply dressings only as needed but reports continued drainage and opening of the wound after she showers. She would like to see wound care for further management. She was receiving percocet 10-325 mg as needed for pain but due to violation of her monthly pain contract is no longer receiving this from our clinic. She would like to  try tramadol. She reports calling the drug abuse hotline for help in quitting. She is to see Dr. Ninfa Linden in the next few weeks for follow-up. She has chronic diarrhea but denies nausea, vomiting, BRBPR, or melena. She is passing gas normally.   Past Medical History  Diagnosis Date  . Hypertension   . Colon polyps   . Hypokalemic alkalosis   . Allergic rhinitis   . GERD (gastroesophageal reflux disease)   . Stroke [facility]) 2010    no residual problems  . Anemia, iron deficiency 05/03/2011    recieves iron infusions as well as periodic red blood cell transfusions  . GI bleeding     Has had bleeding from small bowel AVMs as well as from colon diverticulosis.  . Diverticulosis   . Difficulty sleeping     takes trazadone for sleep  . Peripheral vascular disease ([place])   . Hepatitis C     Began Harvoni early 82016. completion date late 02/2015.   Marland Kitchen Anxiety   . Diabetes mellitus without complication ([place])   . PCT (porphyria cutanea tarda) ([place])    Current Outpatient Prescriptions  Medication Sig Dispense Refill  . amLODipine (NORVASC) 10 MG tablet TAKE 1 TABLET BY MOUTH EVERY DAY 30 tablet 5  . aspirin EC 81 MG tablet Take 1 tablet (81 mg total) by mouth daily. 30 tablet 1  . atenolol (TENORMIN) 50 MG tablet TAKE 1 TABLET BY MOUTH EVERY MORNING 30 tablet 9  . cetirizine (ZYRTEC) 10 MG tablet TAKE 1 TABLET BY MOUTH DAILY 90 tablet 3  .  FLUoxetine (PROZAC) 40 MG capsule TAKE 1 CAPSULE BY MOUTH DAILY 30 capsule 5  . gabapentin (NEURONTIN) 400 MG capsule TAKE 2 CAPSULES BY MOUTH THREE TIMES A DAY 180 capsule 2  . hydrochlorothiazide (HYDRODIURIL) 25 MG tablet Take 1 tablet (25 mg total) by mouth every morning. 90 tablet 3  . lisinopril (PRINIVIL,ZESTRIL) 40 MG tablet Take 1 tablet (40 mg total) by mouth every morning. 90 tablet 4  . omeprazole (PRILOSEC) 20 MG capsule Take 1 capsule (20 mg total) by mouth daily. 30 capsule 11  . oxyCODONE-acetaminophen (PERCOCET) 10-325 MG tablet Take 1 tablet  by mouth every 6 (six) hours as needed for pain. 120 tablet 0  . potassium chloride 20 MEQ/15ML (10%) SOLN TAKE 15ML BY MOUTH EVERY DAY 450 mL 1  . traZODone (DESYREL) 100 MG tablet Take 0.5 tablets (50 mg total) by mouth at bedtime. 30 tablet 5  . vitamin C (ASCORBIC ACID) 500 MG tablet Take 500 mg by mouth every morning.    . Vitamin D, Cholecalciferol, 1000 UNITS TABS Take 1 tablet daily 100 tablet 3   No current facility-administered medications for this visit.   Family History  Problem Relation Age of Onset  . Cancer Father     unknown  . Hypertension Mother   . Diverticulitis Mother    Social History   Social History  . Marital Status: Widowed    Spouse Name: N/A  . Number of Children: 3  . Years of Education: N/A   Occupational History  . Retired    Social History Main Topics  . Smoking status: Current Every Day Smoker -- 0.20 packs/day for 17 years    Types: Cigarettes  . Smokeless tobacco: Never Used     Comment: wants to quit.  Smoking less 1 pk per week.  1-2 cigs per day  . Alcohol Use: 0.0 oz/week    0 Standard drinks or equivalent per week     Comment: Occasionally  . Drug Use: Yes    Special: Marijuana     Comment: occasional  . Sexual Activity: Yes    Birth Control/ Protection: Post-menopausal   Other Topics Concern  . Not on file   Social History Narrative   Review of Systems: Review of Systems  Constitutional: Negative for fever, chills and weight loss.       Decreased appetite  Eyes: Positive for blurred vision (chronic).  Respiratory: Negative for cough, shortness of breath and wheezing.   Cardiovascular: Positive for leg swelling (chronic in right LE). Negative for chest pain.  Gastrointestinal: Positive for nausea (resolved after discontinuing amlodipine ), abdominal pain (at hernia repair site in periumbilical region ) and diarrhea (chronic). Negative for vomiting and constipation.  Genitourinary: Negative for dysuria, urgency and frequency.        Decreased urine output  Skin: Positive for rash (PCT with residual scarring).  Neurological: Positive for sensory change (chronic peripheral neuropathy) and headaches. Negative for dizziness.  Endo/Heme/Allergies: Negative for polydipsia.  Psychiatric/Behavioral: Positive for substance abuse (reports getting help to quit).    Objective:  Physical Exam: Filed Vitals:   07/15/15 1624  BP: 150/72  Pulse: 79  Temp: 98.1 F (36.7 C)  TempSrc: Oral  Weight: 189 lb 9.6 oz (86.002 kg)  SpO2: 100%    Physical Exam  Constitutional: She is oriented to person, place, and time. She appears well-developed and well-nourished.  In wheelchair  HENT:  Head: Normocephalic and atraumatic.  Right Ear: External ear normal.  Left Ear: External  ear normal.  Nose: Nose normal.  Mouth/Throat: Oropharynx is clear and moist. No oropharyngeal exudate.  Eyes: Conjunctivae and EOM are normal. Pupils are equal, round, and reactive to light. Right eye exhibits no discharge. Left eye exhibits no discharge. No scleral icterus.  Neck: Normal range of motion. Neck supple.  Cardiovascular: Normal rate, regular rhythm and normal heart sounds.   Pulmonary/Chest: Effort normal and breath sounds normal. No respiratory distress. She has no wheezes. She has no rales.  Abdominal: Soft. Bowel sounds are normal. She exhibits no distension. There is tenderness (periumbilcal at hernia repair site with mild discharge ). There is no rebound and no guarding.  Musculoskeletal: Normal range of motion. She exhibits no edema or tenderness.  S/p left BKA with prosthesis on  Neurological: She is alert and oriented to person, place, and time.  Skin: Skin is warm and dry. No rash noted. No erythema. No pallor.  Psychiatric: She has a normal mood and affect. Her behavior is normal. Judgment and thought content normal.    Assessment & Plan:   Please see problem list for problem-based assessment and plan

## 2015-07-16 DIAGNOSIS — F1290 Cannabis use, unspecified, uncomplicated: Secondary | ICD-10-CM | POA: Insufficient documentation

## 2015-07-16 DIAGNOSIS — Z789 Other specified health status: Secondary | ICD-10-CM | POA: Insufficient documentation

## 2015-07-16 DIAGNOSIS — Z7289 Other problems related to lifestyle: Secondary | ICD-10-CM | POA: Insufficient documentation

## 2015-07-16 NOTE — Assessment & Plan Note (Addendum)
Assessment: Pt with moderately well-controlled hypertension compliant with three-class (BB, ACEi, and diuretic) anti-hypertensive therapy who presents with worsened blood pressure of 150/72.   Plan:  -BP 150/72 not at goal <140/90 -Continue atenolol 50 mg daily, HCTZ 25 mg daily, and lisinopril 40 mg daily -Discontinue amlodipine 10 mg daily due to AE -Consider starting spironolactone at next visit if BP continues to be elevated -Last CMP on 07/14/15 was normal

## 2015-07-16 NOTE — Assessment & Plan Note (Addendum)
Assessment: Pt with last A1c of 5.3 on 11/30/14 not on medical therapy with no symptomatic hypoglycemia who presents with CBG of 112 and mildly worsened A1c of 6.  Plan:  -A1c 6 at goal <7, continue lifestyle modification -BP 150/72 not at goal <140/90, continue atenolol 50 mg daily, HCTZ 25 mg daily, and lisinopril 40 mg daily. Consider adding spironolactone at next visit if uncontrolled.  -LDL 70 at goal <100 not on statin therapy, consider starting statin at next visit due to 10-yr ASCVD risk of >7.5% -Last annual eye exam on 12/16/14 with no retinopathy -Last annual foot exam on 08/07/13, pt declined right foot exam today, inquire again at next visit -Last annual microalbumin on 03/19/14 was normal, repeat at next visit  -Continue gabapentin 800 mg TID for chronic peripheral neuropathy -BMI 33.59 not at goal <25, encourage weight loss -Continue aspirin 81 mg daily for primary CVD prevention

## 2015-07-16 NOTE — Assessment & Plan Note (Signed)
>>  ASSESSMENT AND PLAN FOR TYPE 2 DIABETES MELLITUS WITH DIABETIC NEUROPATHY (HCC) WRITTEN ON 07/16/2015 12:22 AM BY RABBANI, MARJAN, MD  Assessment: Pt with last A1c of 5.3 on 11/30/14 not on medical therapy with no symptomatic hypoglycemia who presents with CBG of 112 and mildly worsened A1c of 6.  Plan:  -A1c 6 at goal <7, continue lifestyle modification -BP 150/72 not at goal <140/90, continue atenolol  50 mg daily, HCTZ 25 mg daily, and lisinopril  40 mg daily. Consider adding spironolactone  at next visit if uncontrolled.  -LDL 70 at goal <100 not on statin therapy, consider starting statin at next visit due to 10-yr ASCVD risk of >7.5% -Last annual eye exam on 12/16/14 with no retinopathy -Last annual foot exam on 08/07/13, pt declined right foot exam today, inquire again at next visit -Last annual microalbumin on 03/19/14 was normal, repeat at next visit  -Continue gabapentin  800 mg TID for chronic peripheral neuropathy -BMI 33.59 not at goal <25, encourage weight loss -Continue aspirin  81 mg daily for primary CVD prevention

## 2015-07-16 NOTE — Assessment & Plan Note (Addendum)
Assessment: Pt with left hemicolectomy on 02/16/12 due to chronic blood loss from AVM's s/p ventral hernia repair January of [id number] complicated by wound dehiscence February of 2016 who presents with persistent wound drainage and periumbilical pain no longer on narcotic therapy due to violation of pain contract.    Plan: -Will not prescribe tramadol due to violation of pain contract -Pt instructed to take OTC tylenol PRN pain with limit of 2 g daily  -Refer to wound care for persistent periumbilical wound  -Pt to see surgeon Dr. Ninfa Linden for follow-up

## 2015-07-17 ENCOUNTER — Other Ambulatory Visit: Payer: Self-pay | Admitting: Hematology

## 2015-07-17 DIAGNOSIS — D500 Iron deficiency anemia secondary to blood loss (chronic): Secondary | ICD-10-CM

## 2015-07-18 NOTE — Progress Notes (Signed)
Medicine attending: Medical history, presenting problems, physical findings, and medications, reviewed with resident physician Dr Marjan Rabbani on the day of the patient visit and I concur with her evaluation and management plan. 

## 2015-07-19 DIAGNOSIS — H00021 Hordeolum internum right upper eyelid: Secondary | ICD-10-CM | POA: Diagnosis not present

## 2015-07-19 MED FILL — GENTAMICIN 3 MG/ML EYE DRP: 0.3 | 25 days supply | Qty: 5 | Fill #0

## 2015-07-20 ENCOUNTER — Telehealth: Payer: Self-pay | Admitting: Hematology

## 2015-07-20 ENCOUNTER — Telehealth: Payer: Self-pay | Admitting: *Deleted

## 2015-07-20 NOTE — Telephone Encounter (Signed)
Spoke with patient re appointments for 3/8 and 3/15. Also confirmed 3/2 lab. Patient aware YF will given instruction re 4/6 appointments at 3/8 visit.

## 2015-07-20 NOTE — Telephone Encounter (Signed)
Left message on caregiver's mobile # to call back regarding pt.  Unable to leave message on pt's home # or cell #.

## 2015-07-20 NOTE — Telephone Encounter (Signed)
-----   Message from Truitt Merle, MD sent at 07/17/2015 10:47 PM EST ----- Janifer Adie,   Please call pt and let her know the lab results. Her anemia got worse last week, and iron study in 05/2015 was low. She will repeat lab on 3/2 (scheduled) and I will see her with feraheme infusion on 3/9.  Truitt Merle  07/17/2015

## 2015-07-21 DIAGNOSIS — T8189XD Other complications of procedures, not elsewhere classified, subsequent encounter: Secondary | ICD-10-CM | POA: Diagnosis not present

## 2015-07-21 MED FILL — traMADol HCL 50 MG TABS: 50 | 15 days supply | Qty: 60 | Fill #0

## 2015-07-22 NOTE — Telephone Encounter (Signed)
Reached pt today & message from Dr Burr Medico given.  Verified with pt that appt is for 08/03/15 to see Dr Burr Medico.

## 2015-07-22 NOTE — Telephone Encounter (Signed)
-----   Message from Truitt Merle, MD sent at 07/17/2015 10:47 PM EST ----- [name],   Please call pt and let her know the lab results. Her anemia got worse last week, and iron study in 05/2015 was low. She will repeat lab on 3/2 (scheduled) and I will see her with feraheme infusion on 3/9.  Truitt Merle  07/17/2015

## 2015-07-28 ENCOUNTER — Other Ambulatory Visit (HOSPITAL_BASED_OUTPATIENT_CLINIC_OR_DEPARTMENT_OTHER): Payer: Medicare Other

## 2015-07-28 DIAGNOSIS — D500 Iron deficiency anemia secondary to blood loss (chronic): Secondary | ICD-10-CM | POA: Diagnosis not present

## 2015-07-28 DIAGNOSIS — K31819 Angiodysplasia of stomach and duodenum without bleeding: Secondary | ICD-10-CM

## 2015-07-28 LAB — COMPREHENSIVE METABOLIC PANEL
ALBUMIN: 3.5 g/dL (ref 3.5–5.0)
ALK PHOS: 96 U/L (ref 40–150)
ALT: 17 U/L (ref 0–55)
AST: 22 U/L (ref 5–34)
Anion Gap: 9 mEq/L (ref 3–11)
BUN: 20.9 mg/dL (ref 7.0–26.0)
CALCIUM: 8.6 mg/dL (ref 8.4–10.4)
CO2: 24 mEq/L (ref 22–29)
CREATININE: 1.3 mg/dL — AB (ref 0.6–1.1)
Chloride: 108 mEq/L (ref 98–109)
EGFR: 49 mL/min/{1.73_m2} — ABNORMAL LOW (ref >90)
Glucose: 111 mg/dl (ref 70–140)
Potassium: 3.6 mEq/L (ref 3.5–5.1)
SODIUM: 141 meq/L (ref 136–145)
TOTAL PROTEIN: 7.4 g/dL (ref 6.4–8.3)

## 2015-07-28 LAB — CBC WITH DIFFERENTIAL/PLATELET
BASO%: 0.3 % (ref 0.0–2.0)
Basophils Absolute: 0 10*3/uL (ref 0.0–0.1)
EOS ABS: 0.1 10*3/uL (ref 0.0–0.5)
EOS%: 1.4 % (ref 0.0–7.0)
HCT: 29 % — ABNORMAL LOW (ref 34.8–46.6)
HGB: 9.1 g/dL — ABNORMAL LOW (ref 11.6–15.9)
LYMPH%: 27.6 % (ref 14.0–49.7)
MCH: 25.5 pg (ref 25.1–34.0)
MCHC: 31.4 g/dL — AB (ref 31.5–36.0)
MCV: 81.2 fL (ref 79.5–101.0)
MONO#: 1.2 10*3/uL — AB (ref 0.1–0.9)
MONO%: 12.1 % (ref 0.0–14.0)
NEUT%: 58.6 % (ref 38.4–76.8)
NEUTROS ABS: 5.6 10*3/uL (ref 1.5–6.5)
PLATELETS: 372 10*3/uL (ref 145–400)
RBC: 3.57 10*6/uL — AB (ref 3.70–5.45)
RDW: 16.4 % — ABNORMAL HIGH (ref 11.2–14.5)
WBC: 9.5 10*3/uL (ref 3.9–10.3)
lymph#: 2.6 10*3/uL (ref 0.9–3.3)

## 2015-07-28 LAB — TECHNOLOGIST REVIEW

## 2015-07-29 LAB — FERRITIN: Ferritin: 5 ng/ml — ABNORMAL LOW (ref 9–269)

## 2015-07-29 LAB — IRON AND TIBC
%SAT: 5 % — ABNORMAL LOW (ref 21–57)
Iron: 22 ug/dL — ABNORMAL LOW (ref 41–142)
TIBC: 480 ug/dL — ABNORMAL HIGH (ref 236–444)
UIBC: 458 ug/dL — ABNORMAL HIGH (ref 120–384)

## 2015-08-02 DIAGNOSIS — R32 Unspecified urinary incontinence: Secondary | ICD-10-CM | POA: Diagnosis not present

## 2015-08-02 DIAGNOSIS — G933 Postviral fatigue syndrome: Secondary | ICD-10-CM | POA: Diagnosis not present

## 2015-08-03 ENCOUNTER — Ambulatory Visit (HOSPITAL_BASED_OUTPATIENT_CLINIC_OR_DEPARTMENT_OTHER): Payer: Medicare Other | Admitting: Hematology

## 2015-08-03 ENCOUNTER — Ambulatory Visit (HOSPITAL_BASED_OUTPATIENT_CLINIC_OR_DEPARTMENT_OTHER): Payer: Medicare Other

## 2015-08-03 ENCOUNTER — Encounter: Payer: Self-pay | Admitting: Hematology

## 2015-08-03 ENCOUNTER — Telehealth: Payer: Self-pay | Admitting: Hematology

## 2015-08-03 VITALS — BP 119/59 | HR 84 | Temp 98.1°F | Resp 16 | Ht 63.0 in | Wt 193.5 lb

## 2015-08-03 VITALS — BP 131/74 | HR 80 | Temp 97.2°F | Resp 18

## 2015-08-03 DIAGNOSIS — S31109A Unspecified open wound of abdominal wall, unspecified quadrant without penetration into peritoneal cavity, initial encounter: Secondary | ICD-10-CM

## 2015-08-03 DIAGNOSIS — D500 Iron deficiency anemia secondary to blood loss (chronic): Secondary | ICD-10-CM

## 2015-08-03 DIAGNOSIS — E119 Type 2 diabetes mellitus without complications: Secondary | ICD-10-CM

## 2015-08-03 DIAGNOSIS — K7460 Unspecified cirrhosis of liver: Secondary | ICD-10-CM

## 2015-08-03 DIAGNOSIS — Z720 Tobacco use: Secondary | ICD-10-CM

## 2015-08-03 MED ORDER — FAMOTIDINE 20 MG PO TABS
20.0000 mg | ORAL_TABLET | Freq: Once | ORAL | Status: AC
  Administered 2015-08-03: 20 mg via ORAL

## 2015-08-03 MED ORDER — DIPHENHYDRAMINE HCL 50 MG/ML IJ SOLN
INTRAMUSCULAR | Status: AC
  Filled 2015-08-03: qty 1

## 2015-08-03 MED ORDER — FERUMOXYTOL INJECTION 510 MG/17 ML
510.0000 mg | Freq: Once | INTRAVENOUS | Status: AC
  Administered 2015-08-03: 510 mg via INTRAVENOUS
  Filled 2015-08-03: qty 17

## 2015-08-03 MED ORDER — SODIUM CHLORIDE 0.9 % IV SOLN
INTRAVENOUS | Status: DC
  Administered 2015-08-03: 13:00:00 via INTRAVENOUS

## 2015-08-03 MED ORDER — DIPHENHYDRAMINE HCL 50 MG/ML IJ SOLN
50.0000 mg | Freq: Once | INTRAMUSCULAR | Status: AC
  Administered 2015-08-03: 50 mg via INTRAVENOUS

## 2015-08-03 MED ORDER — FAMOTIDINE 20 MG PO TABS
ORAL_TABLET | ORAL | Status: AC
  Filled 2015-08-03: qty 1

## 2015-08-03 NOTE — Progress Notes (Signed)
[facility] OFFICE PROGRESS NOTE   Patricia Mire, MD [place] [address]  DIAGNOSIS: Iron deficiency anemia due to chronic blood loss  Porphyria cutanea tarda ([place])  Cirrhosis of liver without ascites, unspecified hepatic cirrhosis type ([place]).   PROBLEM LIST:  1. Recurrent iron-deficiency anemia secondary to gastrointestinal bleeding (as noted above). [name] also has required red cell transfusions in the past, most recently early December 2012. She underwent a small bowel enteroscopy by Dr. Dan Humphreys at [facility] on 08/13/2011. The enteroscope was passed up to 6 feet into the jejunum. He saw multiple AVMs, which were ablated. Two of the large AVMs bled during APC. These were in the 4th part of the duodenum very close to the ligament of Treitz. The remainder of the AVMs were in the proximal jejunum. In addition to the small bowel AV malformations, which were treated with APC, he saw some small esophageal varices which were not bleeding and also evidence of mild portal hypertensive gastropathy.  2. History of duodenal arteriovenous malformation.  3. History of hepatitis C infection.  4. Gastroesophageal reflux disease.  5. Depression.  6. Hypertension.  7. History of right Bell's palsy.  8. Status post left above-knee amputation secondary to necrotizing fasciitis 03/06/2009.  9. Neurodermatitis.  10. Previous history of alcohol usage.  11. Admission to the hospital from 12/20/2011 through 12/25/2011 for hematochezia felt to be secondary to a diverticular bleed, requiring 2 units of packed red cells.  12. Diverticulosis.  13. Elevated alpha fetoprotein, 72.3 on 11/19/2011 and 76.0 on 01/11/2012.  14. Abnormal MRI of the liver from 11/28/2011 showing diffuse and markedly low signal intensity throughout the liver as well as diffuse low signal intensity in the adrenal glands. 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 There were no findings for cirrhosis, portal hypertension, splenomegaly or ascites, not were there any worrisome enhancing liver lesions.  15. Systolic ejection murmur.   CHIEF COMPLAIN: Follow up anemia  PRIOR TREATMENT: The patient has been heavily dependent upon IV Feraheme. Over the past year or so she has been receiving about 1 Feraheme infusion monthly on average. The patient's most recent infusions of IV Feraheme 510 mg occurred on the following dates: 06/05/2011, 07/03/2011, 07/31/2011, 08/17/2011, 08/28/2011. The patient received IV Feraheme 1020 mg on 10/02/2011 01/30/2012, 06/06/2012 and 09/10/2012, 05/2013, 07/2013, 08/2013, 09/2013, 11/12/2013, 01/15/2014 and 03/16/2014, 09/16/2014 and 09/23/2014, 11/2014 , 02/24/2015,  04/07/2015  CURRENT TREATMENT: As noted above. Feraheme prn, will be cautious for iron supplement due to PCT  INTERVAL HISTORY:  [name] 65 y.o. female with history of recurrent iron-deficiency anemia due to GI bleeding is here for follow-up. GI bleeding is probably due to AV malformations and diverticulosis. She came to the clinic in a automobile wheelchair by herself today.    She still has a open abdominal wound after her her hernia repair surgery. She was referred to wound clinic. She denies any fever, chills. Her skin rash has been resolving, no new flare. She has moderate fatigue, able tolerate a routine activities. She denies any significant signs of bleeding.  MEDICAL HISTORY: Past Medical History  Diagnosis Date  . Hypertension   . Colon polyps   . Hypokalemic alkalosis   . Allergic rhinitis   . GERD (gastroesophageal reflux disease)   . Stroke [facility]) 2010    no residual problems  . Anemia, iron deficiency 05/03/2011    recieves iron infusions  as well as periodic red blood cell transfusions  . GI bleeding     Has had bleeding from small bowel AVMs as well as from colon diverticulosis.  .  Diverticulosis   . Difficulty sleeping     takes trazadone for sleep  . Peripheral vascular disease ([place])   . Hepatitis C     Began Harvoni early 82016. completion date late 02/2015.   Marland Kitchen Anxiety   . Diabetes mellitus without complication ([place])   . PCT (porphyria cutanea tarda) (HCC)      ALLERGIES:  is allergic to ciprofloxacin; morphine and related; penicillins; codeine; amlodipine; and feraheme.  MEDICATIONS: has a current medication list which includes the following prescription(s): aspirin ec, atenolol, cetirizine, fluoxetine, gabapentin, hydrochlorothiazide, lisinopril, omeprazole, potassium chloride, tramadol, trazodone, vitamin c, and vitamin d (cholecalciferol).  SURGICAL HISTORY:  Past Surgical History  Procedure Laterality Date  . Leg amputation above knee    . Esophagogastroduodenoscopy  05/05/2011    Procedure: ESOPHAGOGASTRODUODENOSCOPY (EGD);  Surgeon: Zenovia Jarred, MD;  Location: [facility];  Service: Gastroenterology;  Laterality: N/A;  Dr. Hilarie Fredrickson will do procedure for Dr. Benson Norway Saturday.  . Esophagogastroduodenoscopy  05/08/2011    Procedure: ESOPHAGOGASTRODUODENOSCOPY (EGD);  Surgeon: Beryle Beams;  Location: [facility];  Service: Endoscopy;  Laterality: N/A;  . Esophagogastroduodenoscopy  06/07/2011    Procedure: ESOPHAGOGASTRODUODENOSCOPY (EGD);  Surgeon: Beryle Beams, MD;  Location: [facility];  Service: Endoscopy;  Laterality: N/A;  . Hot hemostasis  06/07/2011    Procedure: HOT HEMOSTASIS (ARGON PLASMA COAGULATION/BICAP);  Surgeon: Beryle Beams, MD;  Location: [facility];  Service: Endoscopy;  Laterality: N/A;  . Esophagogastroduodenoscopy  12/20/2011    Procedure: ESOPHAGOGASTRODUODENOSCOPY (EGD);  Surgeon: Beryle Beams, MD;  Location: [facility];  Service: Endoscopy;  Laterality: N/A;  . Flexible sigmoidoscopy  12/21/2011    Procedure: FLEXIBLE SIGMOIDOSCOPY;  Surgeon: Beryle Beams, MD;  Location: [facility];  Service: Endoscopy;  Laterality: N/A;   . Carpal tunnel release      rt hand  . Partial colectomy  02/15/2012    Procedure: PARTIAL COLECTOMY;  Surgeon: Harl Bowie, MD;  Location: [facility];  Service: General;  Laterality: N/A;  . Laparotomy  02/16/2012    Procedure: EXPLORATORY LAPAROTOMY;  Surgeon: Edward Jolly, MD;  Location: [facility];  Service: General;  Laterality: N/A;  oversewing of anastomotic leak and rigid sigmoidoscopy  . Enteroscopy N/A 12/04/2012    Procedure: ENTEROSCOPY;  Surgeon: Beryle Beams, MD;  Location: [facility];  Service: Endoscopy;  Laterality: N/A;  . Enteroscopy N/A 12/18/2012    Procedure: ENTEROSCOPY;  Surgeon: Beryle Beams, MD;  Location: [facility];  Service: Endoscopy;  Laterality: N/A;  . Eye surgery Bilateral     cataracts  . Tonsillectomy    . Abdominal hysterectomy    . Insertion of mesh N/A 06/01/2014    Procedure: INSERTION OF MESH;  Surgeon: Coralie Keens, MD;  Location: [facility];  Service: General;  Laterality: N/A;  . Incisional hernia repair N/A 06/01/2014    Procedure: ATTEMPTED LAPAROSCOPIC AND OPEN INCISIONAL HERNIA REPAIR WITH MESH;  Surgeon: Coralie Keens, MD;  Location: [facility];  Service: General;  Laterality: N/A;  . Laparotomy N/A 06/21/2014    Procedure: ABDOMINAL WOUND EXPLORATION;  Surgeon: Coralie Keens, MD;  Location: [facility];  Service: General;  Laterality: N/A;  . Application of wound vac N/A 06/21/2014    Procedure: APPLICATION OF WOUND VAC;  Surgeon: Coralie Keens, MD;  Location: [facility];  Service:  General;  Laterality: N/A;  . Enteroscopy N/A 02/25/2015    Procedure: ENTEROSCOPY;  Surgeon: Inda Castle, MD;  Location: [facility];  Service: Endoscopy;  Laterality: N/A;     Medication List       This list is accurate as of: 08/03/15 10:18 PM.  Always use your most recent med list.               aspirin EC 81 MG tablet  Take 1 tablet (81 mg total) by mouth daily.     atenolol 50 MG tablet  Commonly known as:  TENORMIN  TAKE 1 TABLET BY MOUTH  EVERY MORNING     cetirizine 10 MG tablet  Commonly known as:  ZYRTEC  TAKE 1 TABLET BY MOUTH DAILY     FLUoxetine 40 MG capsule  Commonly known as:  PROZAC  TAKE 1 CAPSULE BY MOUTH DAILY     gabapentin 400 MG capsule  Commonly known as:  NEURONTIN  TAKE 2 CAPSULES BY MOUTH THREE TIMES A DAY     hydrochlorothiazide 25 MG tablet  Commonly known as:  HYDRODIURIL  Take 1 tablet (25 mg total) by mouth every morning.     lisinopril 40 MG tablet  Commonly known as:  PRINIVIL,ZESTRIL  Take 1 tablet (40 mg total) by mouth every morning.     omeprazole 20 MG capsule  Commonly known as:  PRILOSEC  Take 1 capsule (20 mg total) by mouth daily.     potassium chloride 20 MEQ/15ML (10%) Soln  TAKE 15ML BY MOUTH EVERY DAY     traMADol 50 MG tablet  Commonly known as:  ULTRAM  Take 50 mg by mouth every 6 (six) hours as needed.     traZODone 100 MG tablet  Commonly known as:  DESYREL  Take 0.5 tablets (50 mg total) by mouth at bedtime.     vitamin C 500 MG tablet  Commonly known as:  ASCORBIC ACID  Take 500 mg by mouth every morning.     Vitamin D (Cholecalciferol) 1000 units Tabs  Take 1 tablet daily         REVIEW OF SYSTEMS:   Constitutional: Denies fevers, chills or abnormal weight loss; Reports mild fatigue.  Eyes: Denies blurriness of vision Ears, nose, mouth, throat, and face: Denies mucositis or sore throat Respiratory: Denies cough, dyspnea or wheezes Cardiovascular: Denies palpitation, chest discomfort or lower extremity swelling Gastrointestinal:  Denies nausea, heartburn or change in bowel habits, hernia is painful and bleeds sometime the overlying skin. Skin: Denies abnormal skin rashes Lymphatics: Denies new lymphadenopathy or easy bruising Neurological:Denies numbness, tingling or new weaknesses Behavioral/Psych: Mood is stable, no new changes  All other systems were reviewed with the patient and are negative.  PHYSICAL EXAMINATION: ECOG PERFORMANCE STATUS:  2 BP 119/59 mmHg  Pulse 84  Temp(Src) 98.1 F (36.7 C) (Oral)  Resp 16  Ht 5\' 3"  (1.6 m)  Wt 193 lb 8 oz (87.771 kg)  BMI 34.29 kg/m2  SpO2 98% General appearance: alert, appears stated age, no distress and moderately obese  Skin: she has multiple skin pigmentations from previous rash on her arms and legs. She is a 1cm size shadow skin ulcer in the right lower extremity below knee, dry, no discharge, no surrounding skin erythema, and nontender. Head: Normocephalic, without obvious abnormality, atraumatic  Neck: no adenopathy, supple, symmetrical, trachea midline and thyroid not enlarged, symmetric, no tenderness/mass/nodules  Lymph nodes: Cervical adenopathy: None appreciated  Heart:regular rate and rhythm, S1, S2  normal and systolic murmur: systolic ejection 2/6, harsh and previously documented without radiation  Lung:chest clear, no wheezing, rales, normal symmetric air entry, Heart exam - S1, S2 normal, no murmur, no gallop, rate regular  Abdomin: soft, distended, normal bowel sounds, nontender, (+) abdominal surgical wound has small discharge.   EXT:No peripheral edema on right extremity; Left above the knee amputation. She is wearing a prothesis.   Labs:  CBC Latest Ref Rng 07/28/2015 07/14/2015 06/02/2015  WBC 3.9 - 10.3 10e3/uL 9.5 7.5 7.3  Hemoglobin 11.6 - 15.9 g/dL 9.1(L) 9.6(L) 11.1(L)  Hematocrit 34.8 - 46.6 % 29.0(L) 30.3(L) 34.6(L)  Platelets 145 - 400 10e3/uL 372 304 212    CMP Latest Ref Rng 07/28/2015 07/14/2015 06/02/2015  Glucose 70 - 140 mg/dl 111 98 99  BUN 7.0 - 26.0 mg/dL 20.9 18.0 12.3  Creatinine 0.6 - 1.1 mg/dL 1.3(H) 0.9 1.0  Sodium 136 - 145 mEq/L 141 142 142  Potassium 3.5 - 5.1 mEq/L 3.6 3.8 3.9  CO2 22 - 29 mEq/L 24 26 27   Calcium 8.4 - 10.4 mg/dL 8.6 8.6 8.9  Total Protein 6.4 - 8.3 g/dL 7.4 7.4 7.5  Total Bilirubin 0.20 - 1.20 mg/dL <0.30 <0.30 <0.30  Alkaline Phos 40 - 150 U/L 96 88 87  AST 5 - 34 U/L 22 26 31   ALT 0 - 55 U/L 17 20 28    Results for  SHATERRIA, CARRARO (MRN [id number]) as of 08/03/2015 12:21  Ref. Range 04/22/2015 14:09 06/02/2015 15:47 07/28/2015 15:10  Iron Latest Ref Range: 41-142 ug/dL 65 23 (L) 22 (L)  UIBC Latest Ref Range: 120-384 ug/dL 312 436 (H) 458 (H)  TIBC Latest Ref Range: 236-444 ug/dL 378 459 (H) 480 (H)  %SAT Latest Ref Range: 21-57 % 17 (L) 5 (L) 5 (L)  Ferritin Latest Ref Range: 9-269 ng/ml 98 10 5 (L)    RADIOGRAPHIC STUDIES: Mm Digital Screening Bilateral  07/31/2013   CLINICAL DATA:  Screening.  EXAM: DIGITAL SCREENING BILATERAL MAMMOGRAM WITH CAD  COMPARISON:  Previous exam(s).  ACR Breast Density Category c: The breast tissue is heterogeneously dense, which may obscure small masses.  FINDINGS: There are no findings suspicious for malignancy. Images were processed with CAD.  IMPRESSION: No mammographic evidence of malignancy. A result letter of this screening mammogram will be mailed directly to the patient.  RECOMMENDATION: Screening mammogram in one year. (Code:SM-B-01Y)  BI-RADS CATEGORY  1: Negative.   Electronically Signed   By: Abelardo Diesel M.D.   On: 07/31/2013 09:01   ASSESSMENT: [name] 65 y.o. female with a history of Iron deficiency anemia due to chronic blood loss  Porphyria cutanea tarda (HCC)  Cirrhosis of liver without ascites, unspecified hepatic cirrhosis type ([place])   PLAN:  1. Iron deficiency anemia secondary to GI loss -History of multiple GI bleeding in 2016, required blood transfusion, hospitalization and IV Feraheme -Doing better lately, her hemoglobin has been stable, no noticeable GI bleeding. -I recommend close follow-up, we'll check her CBC and Feraheme monthly -Due to her PCT, we'll be cautious with IV iron. -Her recent study showed severe iron deficiency. -Due to her worsening moderate anemia, I'll give her 1 dose of IV Feraheme today.  2. Porphyria cutaneous tarda (PCT) -per pt, she had significant sun sensitivity which caused skin blisters on her arms and legs, she  has multiple skin pigmentations from healed skin rash on her arms. -Both her urine and serum porphyria, especially uroporphyrin and coproporphyrin all elevated.  -This is likely  secondary to her hepatitis C -She is currently being treated for hepatitis C. Hopefully her PCT will be improved after her hep c treatment  -She used to follow up with Dr. Deatra Ina at [facility], who has left the practice. She will see a new provider.    3. chronic hepatitis C infection -She was tested positive for hepatitis C in 2009. She has completed treatment for hepatitis C and repeated test was negative.  -liver function has improved   4. DM2. --Management per PCP's office.  -overall well controlled   5. Left BKA -She has a prosthesis, she is wheelchair bound most of time, I encouraged her to continue physical therapy,   6. Abdominal open wound -She will follow-up with wound clinic  6. Smoking cessation -She is still actively smoking, I highly encourage her to quit smoking.  Plan  -IV Feraheme once today. -Lab with CBC and ferritin every month, she will call me after lab test, I would consider IV Feraheme if her anemia gets worse (<9.0) along with low ferritin level  -I will see her back in 3 months -she will follow up with her PCP, ID and surgeon   All questions were answered. The patient knows to call the clinic with any problems, questions or concerns. We can certainly see the patient much sooner if necessary.  I spent 20 minutes counseling the patient face to face. The total time spent in the appointment was 25 minutes.  Truitt Merle 08/03/2015

## 2015-08-03 NOTE — Telephone Encounter (Signed)
per pof to sch pt appt-gave pt copy of avs °

## 2015-08-03 NOTE — Patient Instructions (Signed)

## 2015-08-04 DIAGNOSIS — H00021 Hordeolum internum right upper eyelid: Secondary | ICD-10-CM | POA: Diagnosis not present

## 2015-08-05 ENCOUNTER — Encounter (HOSPITAL_BASED_OUTPATIENT_CLINIC_OR_DEPARTMENT_OTHER): Payer: Medicare Other | Attending: Internal Medicine

## 2015-08-05 DIAGNOSIS — I10 Essential (primary) hypertension: Secondary | ICD-10-CM | POA: Diagnosis not present

## 2015-08-05 DIAGNOSIS — D509 Iron deficiency anemia, unspecified: Secondary | ICD-10-CM | POA: Insufficient documentation

## 2015-08-05 DIAGNOSIS — Z89512 Acquired absence of left leg below knee: Secondary | ICD-10-CM | POA: Insufficient documentation

## 2015-08-05 DIAGNOSIS — K7460 Unspecified cirrhosis of liver: Secondary | ICD-10-CM | POA: Insufficient documentation

## 2015-08-05 DIAGNOSIS — E11622 Type 2 diabetes mellitus with other skin ulcer: Secondary | ICD-10-CM | POA: Diagnosis not present

## 2015-08-05 DIAGNOSIS — Z8673 Personal history of transient ischemic attack (TIA), and cerebral infarction without residual deficits: Secondary | ICD-10-CM | POA: Insufficient documentation

## 2015-08-05 DIAGNOSIS — Y838 Other surgical procedures as the cause of abnormal reaction of the patient, or of later complication, without mention of misadventure at the time of the procedure: Secondary | ICD-10-CM | POA: Diagnosis not present

## 2015-08-05 DIAGNOSIS — F17200 Nicotine dependence, unspecified, uncomplicated: Secondary | ICD-10-CM | POA: Insufficient documentation

## 2015-08-05 DIAGNOSIS — T8189XA Other complications of procedures, not elsewhere classified, initial encounter: Secondary | ICD-10-CM | POA: Diagnosis not present

## 2015-08-05 DIAGNOSIS — E1151 Type 2 diabetes mellitus with diabetic peripheral angiopathy without gangrene: Secondary | ICD-10-CM | POA: Diagnosis not present

## 2015-08-05 DIAGNOSIS — L98491 Non-pressure chronic ulcer of skin of other sites limited to breakdown of skin: Secondary | ICD-10-CM | POA: Diagnosis not present

## 2015-08-05 DIAGNOSIS — T8131XA Disruption of external operation (surgical) wound, not elsewhere classified, initial encounter: Secondary | ICD-10-CM | POA: Diagnosis not present

## 2015-08-09 DIAGNOSIS — E119 Type 2 diabetes mellitus without complications: Secondary | ICD-10-CM | POA: Diagnosis not present

## 2015-08-10 ENCOUNTER — Ambulatory Visit: Payer: Medicare Other

## 2015-08-15 MED FILL — traMADol HCL 50 MG TABS: 50 | 10 days supply | Qty: 40 | Fill #0

## 2015-08-16 ENCOUNTER — Other Ambulatory Visit: Payer: Self-pay | Admitting: Internal Medicine

## 2015-08-17 NOTE — Telephone Encounter (Signed)
Last appt 07/15/15. No f/u appt ascheduled.

## 2015-08-17 NOTE — Telephone Encounter (Signed)
Last appt 07/15/15.

## 2015-08-19 DIAGNOSIS — K7460 Unspecified cirrhosis of liver: Secondary | ICD-10-CM | POA: Diagnosis not present

## 2015-08-19 DIAGNOSIS — T8189XA Other complications of procedures, not elsewhere classified, initial encounter: Secondary | ICD-10-CM | POA: Diagnosis not present

## 2015-08-19 DIAGNOSIS — D509 Iron deficiency anemia, unspecified: Secondary | ICD-10-CM | POA: Diagnosis not present

## 2015-08-19 DIAGNOSIS — I10 Essential (primary) hypertension: Secondary | ICD-10-CM | POA: Diagnosis not present

## 2015-08-19 DIAGNOSIS — E1151 Type 2 diabetes mellitus with diabetic peripheral angiopathy without gangrene: Secondary | ICD-10-CM | POA: Diagnosis not present

## 2015-08-19 DIAGNOSIS — Z8673 Personal history of transient ischemic attack (TIA), and cerebral infarction without residual deficits: Secondary | ICD-10-CM | POA: Diagnosis not present

## 2015-08-19 DIAGNOSIS — Z89512 Acquired absence of left leg below knee: Secondary | ICD-10-CM | POA: Diagnosis not present

## 2015-08-19 DIAGNOSIS — T8131XA Disruption of external operation (surgical) wound, not elsewhere classified, initial encounter: Secondary | ICD-10-CM | POA: Diagnosis not present

## 2015-08-26 DIAGNOSIS — I10 Essential (primary) hypertension: Secondary | ICD-10-CM | POA: Diagnosis not present

## 2015-08-26 DIAGNOSIS — Z89512 Acquired absence of left leg below knee: Secondary | ICD-10-CM | POA: Diagnosis not present

## 2015-08-26 DIAGNOSIS — Z8673 Personal history of transient ischemic attack (TIA), and cerebral infarction without residual deficits: Secondary | ICD-10-CM | POA: Diagnosis not present

## 2015-08-26 DIAGNOSIS — T8131XA Disruption of external operation (surgical) wound, not elsewhere classified, initial encounter: Secondary | ICD-10-CM | POA: Diagnosis not present

## 2015-08-26 DIAGNOSIS — K5792 Diverticulitis of intestine, part unspecified, without perforation or abscess without bleeding: Secondary | ICD-10-CM | POA: Diagnosis not present

## 2015-08-26 DIAGNOSIS — T8189XA Other complications of procedures, not elsewhere classified, initial encounter: Secondary | ICD-10-CM | POA: Diagnosis not present

## 2015-08-26 DIAGNOSIS — E1151 Type 2 diabetes mellitus with diabetic peripheral angiopathy without gangrene: Secondary | ICD-10-CM | POA: Diagnosis not present

## 2015-08-26 DIAGNOSIS — K7460 Unspecified cirrhosis of liver: Secondary | ICD-10-CM | POA: Diagnosis not present

## 2015-08-26 DIAGNOSIS — D509 Iron deficiency anemia, unspecified: Secondary | ICD-10-CM | POA: Diagnosis not present

## 2015-08-26 MED FILL — SULFAMETHOXAZOLE/TMP DS TAB: 800-160 | 5 days supply | Qty: 10 | Fill #0

## 2015-08-30 ENCOUNTER — Other Ambulatory Visit (HOSPITAL_BASED_OUTPATIENT_CLINIC_OR_DEPARTMENT_OTHER): Payer: Medicare Other

## 2015-08-30 DIAGNOSIS — D500 Iron deficiency anemia secondary to blood loss (chronic): Secondary | ICD-10-CM | POA: Diagnosis not present

## 2015-08-30 DIAGNOSIS — K31819 Angiodysplasia of stomach and duodenum without bleeding: Secondary | ICD-10-CM

## 2015-08-30 LAB — COMPREHENSIVE METABOLIC PANEL
ALK PHOS: 82 U/L (ref 40–150)
ALT: 16 U/L (ref 0–55)
AST: 25 U/L (ref 5–34)
Albumin: 3.4 g/dL — ABNORMAL LOW (ref 3.5–5.0)
Anion Gap: 9 mEq/L (ref 3–11)
BUN: 19.9 mg/dL (ref 7.0–26.0)
CO2: 24 mEq/L (ref 22–29)
CREATININE: 1.2 mg/dL — AB (ref 0.6–1.1)
Calcium: 8.8 mg/dL (ref 8.4–10.4)
Chloride: 106 mEq/L (ref 98–109)
EGFR: 58 mL/min/{1.73_m2} — AB (ref >90)
Glucose: 124 mg/dl (ref 70–140)
POTASSIUM: 4.5 meq/L (ref 3.5–5.1)
SODIUM: 140 meq/L (ref 136–145)
Total Protein: 7.3 g/dL (ref 6.4–8.3)

## 2015-08-30 LAB — CBC WITH DIFFERENTIAL/PLATELET
BASO%: 0.6 % (ref 0.0–2.0)
BASOS ABS: 0.1 10*3/uL (ref 0.0–0.1)
EOS ABS: 0.2 10*3/uL (ref 0.0–0.5)
EOS%: 2.3 % (ref 0.0–7.0)
HEMATOCRIT: 29.7 % — AB (ref 34.8–46.6)
HEMOGLOBIN: 9.5 g/dL — AB (ref 11.6–15.9)
LYMPH%: 24.6 % (ref 14.0–49.7)
MCH: 26.1 pg (ref 25.1–34.0)
MCHC: 31.9 g/dL (ref 31.5–36.0)
MCV: 81.7 fL (ref 79.5–101.0)
MONO#: 1.2 10*3/uL — ABNORMAL HIGH (ref 0.1–0.9)
MONO%: 12.1 % (ref 0.0–14.0)
NEUT#: 5.8 10*3/uL (ref 1.5–6.5)
NEUT%: 60.4 % (ref 38.4–76.8)
Platelets: 373 10*3/uL (ref 145–400)
RBC: 3.63 10*6/uL — AB (ref 3.70–5.45)
RDW: 21.7 % — AB (ref 11.2–14.5)
WBC: 9.6 10*3/uL (ref 3.9–10.3)
lymph#: 2.4 10*3/uL (ref 0.9–3.3)

## 2015-09-01 ENCOUNTER — Ambulatory Visit: Payer: Medicare Other | Admitting: Hematology

## 2015-09-01 ENCOUNTER — Other Ambulatory Visit: Payer: Medicare Other

## 2015-09-02 ENCOUNTER — Encounter (HOSPITAL_BASED_OUTPATIENT_CLINIC_OR_DEPARTMENT_OTHER): Payer: Medicaid Other | Attending: Internal Medicine

## 2015-09-02 DIAGNOSIS — K7469 Other cirrhosis of liver: Secondary | ICD-10-CM | POA: Insufficient documentation

## 2015-09-02 DIAGNOSIS — Z09 Encounter for follow-up examination after completed treatment for conditions other than malignant neoplasm: Secondary | ICD-10-CM | POA: Insufficient documentation

## 2015-09-02 DIAGNOSIS — I10 Essential (primary) hypertension: Secondary | ICD-10-CM | POA: Insufficient documentation

## 2015-09-02 DIAGNOSIS — E1151 Type 2 diabetes mellitus with diabetic peripheral angiopathy without gangrene: Secondary | ICD-10-CM | POA: Insufficient documentation

## 2015-09-02 DIAGNOSIS — Z872 Personal history of diseases of the skin and subcutaneous tissue: Secondary | ICD-10-CM | POA: Insufficient documentation

## 2015-09-02 DIAGNOSIS — H409 Unspecified glaucoma: Secondary | ICD-10-CM | POA: Insufficient documentation

## 2015-09-02 DIAGNOSIS — B1920 Unspecified viral hepatitis C without hepatic coma: Secondary | ICD-10-CM | POA: Insufficient documentation

## 2015-09-05 ENCOUNTER — Other Ambulatory Visit: Payer: Self-pay

## 2015-09-05 DIAGNOSIS — Z1231 Encounter for screening mammogram for malignant neoplasm of breast: Secondary | ICD-10-CM

## 2015-09-06 ENCOUNTER — Encounter: Payer: Self-pay | Admitting: Physical Medicine & Rehabilitation

## 2015-09-06 DIAGNOSIS — G933 Postviral fatigue syndrome: Secondary | ICD-10-CM | POA: Diagnosis not present

## 2015-09-06 DIAGNOSIS — R32 Unspecified urinary incontinence: Secondary | ICD-10-CM | POA: Diagnosis not present

## 2015-09-07 ENCOUNTER — Telehealth: Payer: Self-pay

## 2015-09-07 NOTE — Telephone Encounter (Signed)
Called to inform patient of tele-medicine and see if patient would be interested in it. Patient states she does not have a computer but it sounded like a good program. Patient states she doesn't have any family members with one near by. Patient denied request to active My-chart acct at this time. Informed patient to call if she had any questions or concerns before next visit.

## 2015-09-09 DIAGNOSIS — Z09 Encounter for follow-up examination after completed treatment for conditions other than malignant neoplasm: Secondary | ICD-10-CM | POA: Diagnosis present

## 2015-09-09 DIAGNOSIS — K7469 Other cirrhosis of liver: Secondary | ICD-10-CM | POA: Diagnosis not present

## 2015-09-09 DIAGNOSIS — B1920 Unspecified viral hepatitis C without hepatic coma: Secondary | ICD-10-CM | POA: Diagnosis not present

## 2015-09-09 DIAGNOSIS — T8131XA Disruption of external operation (surgical) wound, not elsewhere classified, initial encounter: Secondary | ICD-10-CM | POA: Diagnosis not present

## 2015-09-09 DIAGNOSIS — I10 Essential (primary) hypertension: Secondary | ICD-10-CM | POA: Diagnosis not present

## 2015-09-09 DIAGNOSIS — E1151 Type 2 diabetes mellitus with diabetic peripheral angiopathy without gangrene: Secondary | ICD-10-CM | POA: Diagnosis not present

## 2015-09-09 DIAGNOSIS — Z872 Personal history of diseases of the skin and subcutaneous tissue: Secondary | ICD-10-CM | POA: Diagnosis not present

## 2015-09-09 DIAGNOSIS — H409 Unspecified glaucoma: Secondary | ICD-10-CM | POA: Diagnosis not present

## 2015-09-13 ENCOUNTER — Other Ambulatory Visit: Payer: Self-pay | Admitting: Internal Medicine

## 2015-09-16 MED FILL — traMADol HCL 50 MG TABS: 50 | 10 days supply | Qty: 40 | Fill #0

## 2015-09-20 ENCOUNTER — Encounter: Payer: Self-pay | Admitting: Internal Medicine

## 2015-09-20 ENCOUNTER — Ambulatory Visit (INDEPENDENT_AMBULATORY_CARE_PROVIDER_SITE_OTHER): Payer: Medicare Other | Admitting: Internal Medicine

## 2015-09-20 VITALS — BP 111/67 | HR 78 | Temp 98.1°F | Wt 200.0 lb

## 2015-09-20 DIAGNOSIS — F1410 Cocaine abuse, uncomplicated: Secondary | ICD-10-CM

## 2015-09-20 DIAGNOSIS — Z23 Encounter for immunization: Secondary | ICD-10-CM | POA: Diagnosis not present

## 2015-09-20 DIAGNOSIS — F1490 Cocaine use, unspecified, uncomplicated: Secondary | ICD-10-CM

## 2015-09-20 DIAGNOSIS — R0782 Intercostal pain: Secondary | ICD-10-CM

## 2015-09-20 DIAGNOSIS — R079 Chest pain, unspecified: Secondary | ICD-10-CM | POA: Insufficient documentation

## 2015-09-20 DIAGNOSIS — B182 Chronic viral hepatitis C: Secondary | ICD-10-CM

## 2015-09-20 DIAGNOSIS — K7460 Unspecified cirrhosis of liver: Secondary | ICD-10-CM | POA: Diagnosis not present

## 2015-09-20 NOTE — Assessment & Plan Note (Signed)
Will need to go back to GI.   Will recheck her [place] for [facility] screening. Will need that every 6 months. She would like her PCP to continue with that.

## 2015-09-20 NOTE — Progress Notes (Signed)
   Subjective:    Patient ID: [name], female    DOB: 11-22-1950, 65 y.o.   MRN: [id number]  HPI Here for follow up of HCV.   Genotype 1b, viral load 1.1 million.  Started harvoni 01/04/15 and completed it after 12 weeks. Pleased with medication.  Was undetectable after treatment.   F3/4 on elastography c/w significant fibrosis.  Sees [facility].   Also complains of some breast pain since yesterday.  Some tingling in left hand.  No n/v, no diaphoresis.   Did recently use crack, smokes marijuana, drinks alcohol.  Knows she needs to stop all.  Struggling with that.    Review of Systems  Constitutional: Negative for fatigue.  Gastrointestinal: Negative for nausea and diarrhea.  Neurological: Negative for dizziness and headaches.       Objective:   Physical Exam  Constitutional: She appears well-developed and well-nourished. No distress.  Eyes: No scleral icterus.  Cardiovascular: Normal rate, regular rhythm and normal heart sounds.   No murmur heard. Pulmonary/Chest: Effort normal and breath sounds normal. No respiratory distress.  Skin: No rash noted.   Social History   Social History  . Marital Status: Widowed    Spouse Name: N/A  . Number of Children: 3  . Years of Education: N/A   Occupational History  . Retired    Social History Main Topics  . Smoking status: Current Every Day Smoker -- 0.20 packs/day for 17 years    Types: Cigarettes  . Smokeless tobacco: Never Used     Comment: wants to quit.  Smoking less 1 pk per week.  1-2 cigs per day  . Alcohol Use: 0.0 oz/week    0 Standard drinks or equivalent per week     Comment: Occasionally  . Drug Use: Yes    Special: Marijuana     Comment: occasional  . Sexual Activity: Yes    Birth Control/ Protection: Post-menopausal   Other Topics Concern  . Not on file   Social History Narrative         Assessment & Plan:

## 2015-09-20 NOTE — Assessment & Plan Note (Signed)
Will check her SVR12 today and if negative will be considered cured.

## 2015-09-20 NOTE — Assessment & Plan Note (Signed)
Continues to use but understands the perils and really interested in quitting.  We discussed the barriers and need to eliminate some of her toxic friends who are encouraging her rather than supporting her.

## 2015-09-20 NOTE — Assessment & Plan Note (Signed)
Somewhat concerning but has been 1 day.  I have instructed her to go to the ED across the street now.  She agrees.  Since it has been over 24 hours, I do not feel ambulance is indicated.  I discussed with her my concerns of cardiac origin potentially.

## 2015-09-21 ENCOUNTER — Ambulatory Visit
Admission: RE | Admit: 2015-09-21 | Discharge: 2015-09-21 | Disposition: A | Discharge status: Home / Self Care | Payer: Medicare Other | Source: Ambulatory Visit

## 2015-09-21 DIAGNOSIS — Z1231 Encounter for screening mammogram for malignant neoplasm of breast: Secondary | ICD-10-CM

## 2015-09-22 ENCOUNTER — Telehealth: Payer: Self-pay | Admitting: *Deleted

## 2015-09-22 DIAGNOSIS — Z89612 Acquired absence of left leg above knee: Secondary | ICD-10-CM | POA: Diagnosis not present

## 2015-09-22 LAB — HEPATITIS C RNA QUANTITATIVE: HCV QUANT: NOT DETECTED [IU]/mL (ref <15)

## 2015-09-22 NOTE — Telephone Encounter (Signed)
Patient notified and the ED diagnosis was a muscle strain per patient. She states that the pain has subsided. [name]

## 2015-09-22 NOTE — Telephone Encounter (Signed)
-----   Message from Thayer Headings, MD sent at 09/22/2015  1:16 PM EDT ----- Please let her know that her final HCV viral load is negative and she is now consdiered cured.  She will not follow up.    Looks like also she did not go to the ED for her chest pain.  See if it has resolved.  I also copied her PCP to be aware.    thanks

## 2015-09-23 ENCOUNTER — Encounter (HOSPITAL_COMMUNITY): Payer: Self-pay | Admitting: Emergency Medicine

## 2015-09-23 ENCOUNTER — Emergency Department (HOSPITAL_COMMUNITY)
Admission: EM | Admit: 2015-09-23 | Discharge: 2015-09-24 | Disposition: A | Discharge status: Home / Self Care | Payer: Medicare Other | Attending: Emergency Medicine | Admitting: Emergency Medicine

## 2015-09-23 ENCOUNTER — Emergency Department (HOSPITAL_COMMUNITY): Payer: Medicare Other

## 2015-09-23 DIAGNOSIS — Z7982 Long term (current) use of aspirin: Secondary | ICD-10-CM | POA: Insufficient documentation

## 2015-09-23 DIAGNOSIS — I10 Essential (primary) hypertension: Secondary | ICD-10-CM | POA: Insufficient documentation

## 2015-09-23 DIAGNOSIS — D649 Anemia, unspecified: Secondary | ICD-10-CM | POA: Insufficient documentation

## 2015-09-23 DIAGNOSIS — G8929 Other chronic pain: Secondary | ICD-10-CM | POA: Diagnosis not present

## 2015-09-23 DIAGNOSIS — R0789 Other chest pain: Secondary | ICD-10-CM | POA: Diagnosis not present

## 2015-09-23 DIAGNOSIS — Z79899 Other long term (current) drug therapy: Secondary | ICD-10-CM | POA: Diagnosis not present

## 2015-09-23 DIAGNOSIS — Z79891 Long term (current) use of opiate analgesic: Secondary | ICD-10-CM | POA: Insufficient documentation

## 2015-09-23 DIAGNOSIS — E119 Type 2 diabetes mellitus without complications: Secondary | ICD-10-CM | POA: Diagnosis not present

## 2015-09-23 DIAGNOSIS — R0602 Shortness of breath: Secondary | ICD-10-CM | POA: Diagnosis not present

## 2015-09-23 DIAGNOSIS — F17210 Nicotine dependence, cigarettes, uncomplicated: Secondary | ICD-10-CM | POA: Insufficient documentation

## 2015-09-23 DIAGNOSIS — R079 Chest pain, unspecified: Secondary | ICD-10-CM | POA: Diagnosis not present

## 2015-09-23 DIAGNOSIS — K219 Gastro-esophageal reflux disease without esophagitis: Secondary | ICD-10-CM | POA: Diagnosis not present

## 2015-09-23 DIAGNOSIS — Z8673 Personal history of transient ischemic attack (TIA), and cerebral infarction without residual deficits: Secondary | ICD-10-CM | POA: Insufficient documentation

## 2015-09-23 DIAGNOSIS — R1084 Generalized abdominal pain: Secondary | ICD-10-CM | POA: Diagnosis not present

## 2015-09-23 DIAGNOSIS — R1010 Upper abdominal pain, unspecified: Secondary | ICD-10-CM | POA: Diagnosis not present

## 2015-09-23 LAB — CBC
HCT: 24.5 % — ABNORMAL LOW (ref 36.0–46.0)
Hemoglobin: 7.6 g/dL — ABNORMAL LOW (ref 12.0–15.0)
MCH: 23 pg — ABNORMAL LOW (ref 26.0–34.0)
MCHC: 31 g/dL (ref 30.0–36.0)
MCV: 74.2 fL — ABNORMAL LOW (ref 78.0–100.0)
Platelets: 416 K/uL — ABNORMAL HIGH (ref 150–400)
RBC: 3.3 MIL/uL — ABNORMAL LOW (ref 3.87–5.11)
RDW: 19.1 % — ABNORMAL HIGH (ref 11.5–15.5)
WBC: 10.8 K/uL — ABNORMAL HIGH (ref 4.0–10.5)

## 2015-09-23 MED ORDER — OXYCODONE-ACETAMINOPHEN 5-325 MG PO TABS
1.0000 | ORAL_TABLET | Freq: Once | ORAL | Status: AC
  Administered 2015-09-23: 1 via ORAL
  Filled 2015-09-23: qty 1

## 2015-09-23 NOTE — ED Notes (Addendum)
Per EMS states patient was complaining of pain caused by her bra-when EMS arrived at patient's house she was on the floor screaming in pain-when EMS removed bra in route she had immediate relief-patient fell asleep on the way-patient states she wants a cute female nurse when she arrives to hospital

## 2015-09-23 NOTE — ED Notes (Signed)
Pt now stating she is having chest pain.  Standing orders put in place.

## 2015-09-23 NOTE — ED Provider Notes (Signed)
CSN: [id number]     Arrival date & time 09/23/15  1846 History  By signing my name below, I, Doran Stabler, attest that this documentation has been prepared under the direction and in the presence of Ripley Fraise, MD. Electronically Signed: Doran Stabler, ED Scribe. 09/24/2015. 11:34 PM.   Chief Complaint  Patient presents with  . Chest Pain   The history is provided by the patient. No language interpreter was used.   HPI Comments: [name] is a 65 y.o. female brought by EMS to the Emergency Department with a PMHx of DM, HTN, and CVA complaining of intermittent left sided chest pain that began 3 days ago. Pt also reports sob, left shoulder tingling, left upper back pain, and chronic abdominal pain. Pt states when she has CP, its lasts 30-45 minutes. She states her pain is worse with movement of upper body and arms and better with rest. Pt has never felt this pain before. Today, pt states she was in her wheel chair reaching for the phone when she suddenly "blacked out" and fell out of her wheelchair. Pt states she has had 2 other falls in the past 2 weeks, mechanical falls related to her prosthesis. Pt denies any fevers, vomiting, cough, or any other symptoms at this time.   Past Medical History  Diagnosis Date  . Hypertension   . Colon polyps   . Hypokalemic alkalosis   . Allergic rhinitis   . GERD (gastroesophageal reflux disease)   . Stroke [facility]) 2010    no residual problems  . Anemia, iron deficiency 05/03/2011    recieves iron infusions as well as periodic red blood cell transfusions  . GI bleeding     Has had bleeding from small bowel AVMs as well as from colon diverticulosis.  . Diverticulosis   . Difficulty sleeping     takes trazadone for sleep  . Peripheral vascular disease ([place])   . Hepatitis C     Began [facility] early 82016. completion date late 02/2015.   Marland Kitchen Anxiety   . Diabetes mellitus without complication ([place])   . PCT (porphyria cutanea tarda) [facility])    Past  Surgical History  Procedure Laterality Date  . Leg amputation above knee    . Esophagogastroduodenoscopy  05/05/2011    Procedure: ESOPHAGOGASTRODUODENOSCOPY (EGD);  Surgeon: Zenovia Jarred, MD;  Location: [facility];  Service: Gastroenterology;  Laterality: N/A;  Dr. Hilarie Fredrickson will do procedure for Dr. Benson Norway Saturday.  . Esophagogastroduodenoscopy  05/08/2011    Procedure: ESOPHAGOGASTRODUODENOSCOPY (EGD);  Surgeon: Beryle Beams;  Location: [facility];  Service: Endoscopy;  Laterality: N/A;  . Esophagogastroduodenoscopy  06/07/2011    Procedure: ESOPHAGOGASTRODUODENOSCOPY (EGD);  Surgeon: Beryle Beams, MD;  Location: [facility];  Service: Endoscopy;  Laterality: N/A;  . Hot hemostasis  06/07/2011    Procedure: HOT HEMOSTASIS (ARGON PLASMA COAGULATION/BICAP);  Surgeon: Beryle Beams, MD;  Location: [facility];  Service: Endoscopy;  Laterality: N/A;  . Esophagogastroduodenoscopy  12/20/2011    Procedure: ESOPHAGOGASTRODUODENOSCOPY (EGD);  Surgeon: Beryle Beams, MD;  Location: [facility];  Service: Endoscopy;  Laterality: N/A;  . Flexible sigmoidoscopy  12/21/2011    Procedure: FLEXIBLE SIGMOIDOSCOPY;  Surgeon: Beryle Beams, MD;  Location: [facility];  Service: Endoscopy;  Laterality: N/A;  . Carpal tunnel release      rt hand  . Partial colectomy  02/15/2012    Procedure: PARTIAL COLECTOMY;  Surgeon: Harl Bowie, MD;  Location: [facility];  Service: General;  Laterality: N/A;  .  Laparotomy  02/16/2012    Procedure: EXPLORATORY LAPAROTOMY;  Surgeon: Edward Jolly, MD;  Location: [facility];  Service: General;  Laterality: N/A;  oversewing of anastomotic leak and rigid sigmoidoscopy  . Enteroscopy N/A 12/04/2012    Procedure: ENTEROSCOPY;  Surgeon: Beryle Beams, MD;  Location: [facility];  Service: Endoscopy;  Laterality: N/A;  . Enteroscopy N/A 12/18/2012    Procedure: ENTEROSCOPY;  Surgeon: Beryle Beams, MD;  Location: [facility];  Service: Endoscopy;  Laterality: N/A;  . Eye  surgery Bilateral     cataracts  . Tonsillectomy    . Abdominal hysterectomy    . Insertion of mesh N/A 06/01/2014    Procedure: INSERTION OF MESH;  Surgeon: Coralie Keens, MD;  Location: [place];  Service: General;  Laterality: N/A;  . Incisional hernia repair N/A 06/01/2014    Procedure: ATTEMPTED LAPAROSCOPIC AND OPEN INCISIONAL HERNIA REPAIR WITH MESH;  Surgeon: Coralie Keens, MD;  Location: [place];  Service: General;  Laterality: N/A;  . Laparotomy N/A 06/21/2014    Procedure: ABDOMINAL WOUND EXPLORATION;  Surgeon: Coralie Keens, MD;  Location: [place];  Service: General;  Laterality: N/A;  . Application of wound vac N/A 06/21/2014    Procedure: APPLICATION OF WOUND VAC;  Surgeon: Coralie Keens, MD;  Location: [place];  Service: General;  Laterality: N/A;  . Enteroscopy N/A 02/25/2015    Procedure: ENTEROSCOPY;  Surgeon: Inda Castle, MD;  Location: [place];  Service: Endoscopy;  Laterality: N/A;   Family History  Problem Relation Age of Onset  . Cancer Father     unknown  . Hypertension Mother   . Diverticulitis Mother    Social History  Substance Use Topics  . Smoking status: Current Every Day Smoker -- 0.20 packs/day for 17 years    Types: Cigarettes  . Smokeless tobacco: Never Used     Comment: wants to quit.  Smoking less 1 pk per week.  1-2 cigs per day  . Alcohol Use: 0.0 oz/week    0 Standard drinks or equivalent per week     Comment: Occasionally   OB History    No data available     Review of Systems  Constitutional: Negative for fever.  Respiratory: Positive for shortness of breath. Negative for cough.   Cardiovascular: Positive for chest pain.  Gastrointestinal: Positive for abdominal pain. Negative for vomiting.  Musculoskeletal: Positive for back pain.  All other systems reviewed and are negative.  Allergies  Ciprofloxacin; Morphine and related; Penicillins; Codeine; Amlodipine; and Feraheme  Home Medications   Prior to Admission medications    Medication Sig Start Date End Date Taking? Authorizing Provider  aspirin EC 81 MG tablet Take 1 tablet (81 mg total) by mouth daily. 03/04/15   Burgess Estelle, MD  atenolol (TENORMIN) 50 MG tablet TAKE 1 TABLET BY MOUTH EVERY MORNING 08/17/15   [name], MD  cetirizine (ZYRTEC) 10 MG tablet TAKE 1 TABLET BY MOUTH DAILY 04/15/15   Bartholomew Crews, MD  FLUoxetine (PROZAC) 40 MG capsule TAKE 1 CAPSULE BY MOUTH DAILY 09/14/15   [name], MD  gabapentin (NEURONTIN) 400 MG capsule TAKE 2 CAPSULES BY MOUTH THREE TIMES A DAY 08/17/15   [name], MD  hydrochlorothiazide (HYDRODIURIL) 25 MG tablet TAKE 1 TABLET BY MOUTH EVERY MORNING 09/14/15   [name], MD  lisinopril (PRINIVIL,ZESTRIL) 40 MG tablet TAKE 1 TABLET BY MOUTH EVERY MORNING 09/14/15   [name], MD  omeprazole (PRILOSEC) 20 MG capsule Take 1 capsule (20  mg total) by mouth daily. 12/29/14   Thayer Headings, MD  potassium chloride 20 MEQ/15ML (10%) SOLN TAKE 15ML BY MOUTH EVERY DAY 09/14/15   [name], MD  traMADol (ULTRAM) 50 MG tablet Take 50 mg by mouth every 6 (six) hours as needed. 07/21/15   Historical Provider, MD  traZODone (DESYREL) 50 MG tablet TAKE 1 TABLET BY MOUTH EVERY NIGHT AT BEDTIME 08/17/15   [name], MD  vitamin C (ASCORBIC ACID) 500 MG tablet Take 500 mg by mouth every morning.    Historical Provider, MD  Vitamin D, Cholecalciferol, 1000 UNITS TABS Take 1 tablet daily 04/01/15   Marjan Rabbani, MD   BP 124/67 mmHg  Pulse 83  Temp(Src) 98.2 F (36.8 C) (Oral)  Resp 16  SpO2 97%   Physical Exam CONSTITUTIONAL: Well developed/well nourished HEAD: Normocephalic/atraumatic EYES: EOMI ENMT: Mucous membranes moist NECK: supple no meningeal signs SPINE/BACK:entire spine nontender CV: S1/S2 noted, no murmurs/rubs/gallops noted CHEST: diffuse left sided chest wall tenderness, no crepitus LUNGS: Lungs are clear to auscultation bilaterally, no apparent distress ABDOMEN: soft, nontender,  obese GU:no cva tenderness NEURO: Pt is awake/alert/appropriate, moves all extremitiesx4.     EXTREMITIES: pulses normal/equal, full ROM, left leg prosthetic noted SKIN: warm, color normal PSYCH: no abnormalities of mood noted, alert and oriented to situation  ED Course  Procedures  DIAGNOSTIC STUDIES: Oxygen Saturation is 97% on room air, normal by my interpretation.    COORDINATION OF CARE: 11:21 PM Will give pain medication. Will order CXR, EKG, and blood work. Discussed treatment plan with pt at bedside and pt agreed to plan. 1:59 AM Pt with h/o recurrent anemia Appears she has this chronically, with some worsening at times due to GI AVM Currently hemoccult negative (exam with nurse present) She reports she will go in for transfusions with oncology (notes also indicate she has feraheme transfusion) Will give PRBC here and then d/c home  As for CP - this appears reproducible, no distress, no ischemic findings on ekg and troponin negative  Pt tolerated transfusion well She is awake/alert She is not actively bleeding I feel she is safe/appropriate for d/c home Advised to call her hematologist this week Baseline HGB is around 9, and after transfusion, safe for d/c  Labs Review Labs Reviewed  BASIC METABOLIC PANEL - Abnormal; Notable for the following:    Glucose, Bld 132 (*)    BUN 24 (*)    Creatinine, Ser 1.10 (*)    GFR calc non Af Amer 53 (*)    All other components within normal limits  CBC - Abnormal; Notable for the following:    WBC 10.8 (*)    RBC 3.30 (*)    Hemoglobin 7.6 (*)    HCT 24.5 (*)    MCV 74.2 (*)    MCH 23.0 (*)    RDW 19.1 (*)    Platelets 416 (*)    All other components within normal limits  I-STAT TROPOININ, ED  POC OCCULT BLOOD, ED  TYPE AND SCREEN  PREPARE RBC (CROSSMATCH)    Imaging Review Dg Chest 2 View  09/24/2015  CLINICAL DATA:  Acute onset of intermittent left-sided chest pain and left shoulder tingling. Shortness of breath.  Initial encounter. EXAM: CHEST  2 VIEW COMPARISON:  Chest radiograph performed 06/02/2013 FINDINGS: The lungs are well-aerated and clear. There is no evidence of focal opacification, pleural effusion or pneumothorax. The heart is borderline normal in size. No acute osseous abnormalities are seen. IMPRESSION: No acute cardiopulmonary  process seen. Electronically Signed   By: Garald Balding M.D.   On: 09/24/2015 00:15   I have personally reviewed and evaluated these images and lab results as part of my medical decision-making.   EKG Interpretation   Date/Time:  Friday September 23 2015 23:21:44 EDT Ventricular Rate:  78 PR Interval:  234 QRS Duration: 78 QT Interval:  407 QTC Calculation: 464 R Axis:   31 Text Interpretation:  Sinus rhythm Prolonged PR interval Borderline T  abnormalities, inferior leads Confirmed by Christy Gentles  MD, Elenore Rota ([id number]) on  09/23/2015 11:32:49 PM      MDM   Final diagnoses:  Chest pain, unspecified chest pain type  Anemia, unspecified anemia type    Nursing notes including past medical history and social history reviewed and considered in documentation xrays/imaging reviewed by myself and considered during evaluation Labs/vital reviewed myself and considered during evaluation    I personally performed the services described in this documentation, which was scribed in my presence. The recorded information has been reviewed and is accurate.       Ripley Fraise, MD 09/24/15 [phone]

## 2015-09-24 DIAGNOSIS — R0602 Shortness of breath: Secondary | ICD-10-CM | POA: Diagnosis not present

## 2015-09-24 DIAGNOSIS — R0789 Other chest pain: Secondary | ICD-10-CM | POA: Diagnosis not present

## 2015-09-24 LAB — BASIC METABOLIC PANEL
Anion gap: 11 (ref 5–15)
BUN: 24 mg/dL — AB (ref 6–20)
CHLORIDE: 104 mmol/L (ref 101–111)
CO2: 24 mmol/L (ref 22–32)
CREATININE: 1.1 mg/dL — AB (ref 0.44–1.00)
Calcium: 9.1 mg/dL (ref 8.9–10.3)
GFR calc Af Amer: >60 mL/min (ref >60)
GFR calc non Af Amer: 53 mL/min — ABNORMAL LOW (ref >60)
Glucose, Bld: 132 mg/dL — ABNORMAL HIGH (ref 65–99)
Potassium: 4.3 mmol/L (ref 3.5–5.1)
SODIUM: 139 mmol/L (ref 135–145)

## 2015-09-24 LAB — POC OCCULT BLOOD, ED: Fecal Occult Bld: NEGATIVE

## 2015-09-24 LAB — PREPARE RBC (CROSSMATCH)

## 2015-09-24 LAB — I-STAT TROPONIN, ED: TROPONIN I, POC: 0 ng/mL (ref 0.00–0.08)

## 2015-09-24 MED ORDER — OXYCODONE-ACETAMINOPHEN 5-325 MG PO TABS
1.0000 | ORAL_TABLET | Freq: Once | ORAL | Status: AC
  Administered 2015-09-24: 1 via ORAL
  Filled 2015-09-24: qty 1

## 2015-09-24 MED ORDER — SODIUM CHLORIDE 0.9 % IV SOLN: 10.0000 mL/h | Freq: Once | INTRAVENOUS | Status: DC

## 2015-09-24 NOTE — Discharge Instructions (Signed)
Your caregiver has diagnosed you as having chest pain that is not specific for one problem, but does not require admission.  Chest pain comes from many different causes.  SEEK IMMEDIATE MEDICAL ATTENTION IF: You have severe chest pain, especially if the pain is crushing or pressure-like and spreads to the arms, back, neck, or jaw, or if you have sweating, nausea (feeling sick to your stomach), or shortness of breath. THIS IS AN EMERGENCY. Don't wait to see if the pain will go away. Get medical help at once. Call 911 or 0 (operator). DO NOT drive yourself to the hospital.  Your chest pain gets worse and does not go away with rest.  You have an attack of chest pain lasting longer than usual, despite rest and treatment with the medications your caregiver has prescribed.  You wake from sleep with chest pain or shortness of breath.  You feel dizzy or faint.  You have chest pain not typical of your usual pain for which you originally saw your caregiver.    Anemia, Nonspecific Anemia is a condition in which the concentration of red blood cells or hemoglobin in the blood is below normal. Hemoglobin is a substance in red blood cells that carries oxygen to the tissues of the body. Anemia results in not enough oxygen reaching these tissues.  CAUSES  Common causes of anemia include:   Excessive bleeding. Bleeding may be internal or external. This includes excessive bleeding from periods (in women) or from the intestine.   Poor nutrition.   Chronic kidney, thyroid, and liver disease.  Bone marrow disorders that decrease red blood cell production.  Cancer and treatments for cancer.  HIV, AIDS, and their treatments.  Spleen problems that increase red blood cell destruction.  Blood disorders.  Excess destruction of red blood cells due to infection, medicines, and autoimmune disorders. SIGNS AND SYMPTOMS   Minor weakness.   Dizziness.   Headache.  Palpitations.   Shortness of breath,  especially with exercise.   Paleness.  Cold sensitivity.  Indigestion.  Nausea.  Difficulty sleeping.  Difficulty concentrating. Symptoms may occur suddenly or they may develop slowly.  DIAGNOSIS  Additional blood tests are often needed. These help your health care provider determine the best treatment. Your health care provider will check your stool for blood and look for other causes of blood loss.  TREATMENT  Treatment varies depending on the cause of the anemia. Treatment can include:   Supplements of iron, vitamin 123456, or folic acid.   Hormone medicines.   A blood transfusion. This may be needed if blood loss is severe.   Hospitalization. This may be needed if there is significant continual blood loss.   Dietary changes.  Spleen removal. HOME CARE INSTRUCTIONS Keep all follow-up appointments. It often takes many weeks to correct anemia, and having your health care provider check on your condition and your response to treatment is very important. SEEK IMMEDIATE MEDICAL CARE IF:   You develop extreme weakness, shortness of breath, or chest pain.   You become dizzy or have trouble concentrating.  You develop heavy vaginal bleeding.   You develop a rash.   You have bloody or black, tarry stools.   You faint.   You vomit up blood.   You vomit repeatedly.   You have abdominal pain.  You have a fever or persistent symptoms for more than 2-3 days.   You have a fever and your symptoms suddenly get worse.   You are dehydrated.  MAKE SURE  YOU:  Understand these instructions.  Will watch your condition.  Will get help right away if you are not doing well or get worse.   This information is not intended to replace advice given to you by your health care provider. Make sure you discuss any questions you have with your health care provider.   Document Released: 06/21/2004 Document Revised: 01/14/2013 Document Reviewed: 11/07/2012 Elsevier  Interactive Patient Education [employer].

## 2015-09-24 NOTE — ED Notes (Signed)
MD at bedside. 

## 2015-09-26 ENCOUNTER — Telehealth: Payer: Self-pay | Admitting: Hematology

## 2015-09-26 LAB — TYPE AND SCREEN
ABO/RH(D): AB POS
ANTIBODY SCREEN: NEGATIVE
UNIT DIVISION: 0

## 2015-09-26 NOTE — Telephone Encounter (Signed)
pt cld to ake appt-gave pt time  date of appt/5/8@3 

## 2015-09-27 ENCOUNTER — Other Ambulatory Visit (HOSPITAL_BASED_OUTPATIENT_CLINIC_OR_DEPARTMENT_OTHER): Payer: Medicare Other

## 2015-09-27 DIAGNOSIS — D500 Iron deficiency anemia secondary to blood loss (chronic): Secondary | ICD-10-CM | POA: Diagnosis not present

## 2015-09-27 DIAGNOSIS — K31819 Angiodysplasia of stomach and duodenum without bleeding: Secondary | ICD-10-CM

## 2015-09-27 LAB — CBC WITH DIFFERENTIAL/PLATELET
BASO%: 0.3 % (ref 0.0–2.0)
BASOS ABS: 0 10*3/uL (ref 0.0–0.1)
EOS%: 2.2 % (ref 0.0–7.0)
Eosinophils Absolute: 0.2 10*3/uL (ref 0.0–0.5)
HCT: 28.1 % — ABNORMAL LOW (ref 34.8–46.6)
HGB: 8.5 g/dL — ABNORMAL LOW (ref 11.6–15.9)
LYMPH#: 2.7 10*3/uL (ref 0.9–3.3)
LYMPH%: 31.3 % (ref 14.0–49.7)
MCH: 23.7 pg — AB (ref 25.1–34.0)
MCHC: 30.2 g/dL — AB (ref 31.5–36.0)
MCV: 78.5 fL — ABNORMAL LOW (ref 79.5–101.0)
MONO#: 0.8 10*3/uL (ref 0.1–0.9)
MONO%: 8.9 % (ref 0.0–14.0)
NEUT#: 5 10*3/uL (ref 1.5–6.5)
NEUT%: 57.3 % (ref 38.4–76.8)
PLATELETS: 282 10*3/uL (ref 145–400)
RBC: 3.58 10*6/uL — ABNORMAL LOW (ref 3.70–5.45)
RDW: 20 % — ABNORMAL HIGH (ref 11.2–14.5)
WBC: 8.7 10*3/uL (ref 3.9–10.3)

## 2015-09-27 LAB — COMPREHENSIVE METABOLIC PANEL
ALT: 15 U/L (ref 0–55)
ANION GAP: 6 meq/L (ref 3–11)
AST: 22 U/L (ref 5–34)
Albumin: 3.5 g/dL (ref 3.5–5.0)
Alkaline Phosphatase: 79 U/L (ref 40–150)
BUN: 17.6 mg/dL (ref 7.0–26.0)
CALCIUM: 9.2 mg/dL (ref 8.4–10.4)
CHLORIDE: 105 meq/L (ref 98–109)
CO2: 28 mEq/L (ref 22–29)
CREATININE: 1.1 mg/dL (ref 0.6–1.1)
EGFR: 62 mL/min/{1.73_m2} — ABNORMAL LOW (ref >90)
Glucose: 106 mg/dl (ref 70–140)
POTASSIUM: 4.2 meq/L (ref 3.5–5.1)
Sodium: 139 mEq/L (ref 136–145)
Total Bilirubin: <0.3 mg/dL (ref 0.20–1.20)
Total Protein: 7.4 g/dL (ref 6.4–8.3)

## 2015-09-28 ENCOUNTER — Telehealth: Payer: Self-pay | Admitting: Hematology

## 2015-09-28 ENCOUNTER — Telehealth: Payer: Self-pay | Admitting: *Deleted

## 2015-09-28 ENCOUNTER — Ambulatory Visit
Admission: RE | Admit: 2015-09-28 | Discharge: 2015-09-28 | Disposition: A | Discharge status: Home / Self Care | Payer: Medicare Other | Source: Ambulatory Visit | Attending: Internal Medicine | Admitting: Internal Medicine

## 2015-09-28 ENCOUNTER — Other Ambulatory Visit: Payer: Self-pay | Admitting: Hematology

## 2015-09-28 DIAGNOSIS — Z1389 Encounter for screening for other disorder: Secondary | ICD-10-CM | POA: Diagnosis not present

## 2015-09-28 DIAGNOSIS — K7460 Unspecified cirrhosis of liver: Secondary | ICD-10-CM

## 2015-09-28 LAB — IRON AND TIBC
%SAT: 3 % — ABNORMAL LOW (ref 21–57)
Iron: 16 ug/dL — ABNORMAL LOW (ref 41–142)
TIBC: 503 ug/dL — AB (ref 236–444)
UIBC: 487 ug/dL — ABNORMAL HIGH (ref 120–384)

## 2015-09-28 LAB — FERRITIN: FERRITIN: 18 ng/mL (ref 9–269)

## 2015-09-28 NOTE — Telephone Encounter (Signed)
Per staff message and POF I have scheduled appts. Advised scheduler of appts. JMW  

## 2015-09-28 NOTE — Telephone Encounter (Signed)
per pof to sch pt appt-cld pt and adv pt of time/date of 5/8 appt@3 

## 2015-10-03 ENCOUNTER — Telehealth: Payer: Self-pay | Admitting: Hematology

## 2015-10-03 ENCOUNTER — Ambulatory Visit (HOSPITAL_BASED_OUTPATIENT_CLINIC_OR_DEPARTMENT_OTHER): Payer: Medicare Other | Admitting: Hematology

## 2015-10-03 ENCOUNTER — Ambulatory Visit (HOSPITAL_BASED_OUTPATIENT_CLINIC_OR_DEPARTMENT_OTHER): Payer: Medicare Other

## 2015-10-03 ENCOUNTER — Encounter: Payer: Self-pay | Admitting: Hematology

## 2015-10-03 VITALS — BP 112/92 | HR 81 | Temp 98.4°F | Resp 18 | Ht 63.0 in

## 2015-10-03 DIAGNOSIS — K7460 Unspecified cirrhosis of liver: Secondary | ICD-10-CM

## 2015-10-03 DIAGNOSIS — E119 Type 2 diabetes mellitus without complications: Secondary | ICD-10-CM | POA: Diagnosis not present

## 2015-10-03 DIAGNOSIS — Z720 Tobacco use: Secondary | ICD-10-CM | POA: Diagnosis not present

## 2015-10-03 DIAGNOSIS — Z89512 Acquired absence of left leg below knee: Secondary | ICD-10-CM

## 2015-10-03 DIAGNOSIS — D500 Iron deficiency anemia secondary to blood loss (chronic): Secondary | ICD-10-CM | POA: Diagnosis not present

## 2015-10-03 MED ORDER — SODIUM CHLORIDE 0.9 % IV SOLN
510.0000 mg | Freq: Once | INTRAVENOUS | Status: AC
  Administered 2015-10-03: 510 mg via INTRAVENOUS
  Filled 2015-10-03: qty 17

## 2015-10-03 MED ORDER — FAMOTIDINE IN NACL 20-0.9 MG/50ML-% IV SOLN
INTRAVENOUS | Status: AC
  Filled 2015-10-03: qty 50

## 2015-10-03 MED ORDER — FAMOTIDINE IN NACL 20-0.9 MG/50ML-% IV SOLN
20.0000 mg | Freq: Once | INTRAVENOUS | Status: AC
  Administered 2015-10-03: 20 mg via INTRAVENOUS

## 2015-10-03 MED ORDER — DIPHENHYDRAMINE HCL 50 MG/ML IJ SOLN
INTRAMUSCULAR | Status: AC
  Filled 2015-10-03: qty 1

## 2015-10-03 MED ORDER — DIPHENHYDRAMINE HCL 50 MG/ML IJ SOLN
50.0000 mg | Freq: Once | INTRAMUSCULAR | Status: AC
  Administered 2015-10-03: 50 mg via INTRAVENOUS

## 2015-10-03 MED ORDER — SODIUM CHLORIDE 0.9 % IV SOLN
Freq: Once | INTRAVENOUS | Status: AC
  Administered 2015-10-03: 16:00:00 via INTRAVENOUS

## 2015-10-03 NOTE — Progress Notes (Signed)
[facility] OFFICE PROGRESS NOTE   Patricia Mire, MD [place] [address]  DIAGNOSIS: Iron deficiency anemia due to chronic blood loss  Porphyria cutanea tarda (HCC)  Hepatic cirrhosis, unspecified hepatic cirrhosis type ([place]).   PROBLEM LIST:  1. Recurrent iron-deficiency anemia secondary to gastrointestinal bleeding (as noted above). Ms. Goldberg also has required red cell transfusions in the past, most recently early December 2012. She underwent a small bowel enteroscopy by Dr. Dan Humphreys at [facility] on 08/13/2011. The enteroscope was passed up to 6 feet into the jejunum. He saw multiple AVMs, which were ablated. Two of the large AVMs bled during APC. These were in the 4th part of the duodenum very close to the ligament of Treitz. The remainder of the AVMs were in the proximal jejunum. In addition to the small bowel AV malformations, which were treated with APC, he saw some small esophageal varices which were not bleeding and also evidence of mild portal hypertensive gastropathy.  2. History of duodenal arteriovenous malformation.  3. History of hepatitis C infection.  4. Gastroesophageal reflux disease.  5. Depression.  6. Hypertension.  7. History of right Bell's palsy.  8. Status post left above-knee amputation secondary to necrotizing fasciitis 03/06/2009.  9. Neurodermatitis.  10. Previous history of alcohol usage.  11. Admission to the hospital from 12/20/2011 through 12/25/2011 for hematochezia felt to be secondary to a diverticular bleed, requiring 2 units of packed red cells.  12. Diverticulosis.  13. Elevated alpha fetoprotein, 72.3 on 11/19/2011 and 76.0 on 01/11/2012.  14. Abnormal MRI of the liver from 11/28/2011 showing diffuse and markedly low signal intensity throughout the liver as well as diffuse low signal intensity in the adrenal glands. 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 There were no findings for cirrhosis, portal hypertension, splenomegaly or ascites, not were there any worrisome enhancing liver lesions.  15. Systolic ejection murmur.   CHIEF COMPLAIN: Follow up anemia  PRIOR TREATMENT: The patient has been heavily dependent upon IV Feraheme. Over the past year or so she has been receiving about 1 Feraheme infusion monthly on average. The patient's most recent infusions of IV Feraheme 510 mg occurred on the following dates: 06/05/2011, 07/03/2011, 07/31/2011, 08/17/2011, 08/28/2011. The patient received IV Feraheme 1020 mg on 10/02/2011 01/30/2012, 06/06/2012 and 09/10/2012, 05/2013, 07/2013, 08/2013, 09/2013, 11/12/2013, 01/15/2014 and 03/16/2014, 09/16/2014 and 09/23/2014, 11/2014 , 02/24/2015,  04/07/2015, 08/03/2015, 10/03/2015  CURRENT TREATMENT: As noted above. Feraheme prn, will be cautious for iron supplement due to PCT  INTERVAL HISTORY:  [name] 65 y.o. female with history of recurrent iron-deficiency anemia due to GI bleeding is here for follow-up. GI bleeding is probably due to AV malformations and diverticulosis. She came to the clinic in a automobile wheelchair by herself today.    She has competed Hep C treatment in 03/2015 and hep c became undetectable afterwards. She has not had much PCT flare up lately. She was seen at ED on 09/23/15 for anemia and dyspnea, receipt blood transfusion. She feels better overall afterwards. Denies any chest pain, no overt GI bleeding lately. No other new complaints  MEDICAL HISTORY: Past Medical History  Diagnosis Date  . Hypertension   . Colon polyps   . Hypokalemic alkalosis   . Allergic rhinitis   . GERD (gastroesophageal reflux disease)   . Stroke [facility]) 2010    no residual problems  . Anemia, iron deficiency 05/03/2011  recieves iron infusions as well as periodic red blood cell transfusions  . GI bleeding     Has had bleeding from small bowel AVMs as well as from colon  diverticulosis.  . Diverticulosis   . Difficulty sleeping     takes trazadone for sleep  . Peripheral vascular disease ([place])   . Hepatitis C     Began Harvoni early 82016. completion date late 02/2015.   Marland Kitchen Anxiety   . Diabetes mellitus without complication ([place])   . PCT (porphyria cutanea tarda) (HCC)      ALLERGIES:  is allergic to ciprofloxacin; morphine and related; penicillins; codeine; amlodipine; and feraheme.  MEDICATIONS: has a current medication list which includes the following prescription(s): amlodipine, aspirin ec, atenolol, cetirizine, fluoxetine, gabapentin, hydrochlorothiazide, lisinopril, omeprazole, potassium chloride, tramadol, trazodone, vitamin c, and vitamin d (cholecalciferol).  SURGICAL HISTORY:  Past Surgical History  Procedure Laterality Date  . Leg amputation above knee    . Esophagogastroduodenoscopy  05/05/2011    Procedure: ESOPHAGOGASTRODUODENOSCOPY (EGD);  Surgeon: Zenovia Jarred, MD;  Location: [facility];  Service: Gastroenterology;  Laterality: N/A;  Dr. Hilarie Fredrickson will do procedure for Dr. Benson Norway Saturday.  . Esophagogastroduodenoscopy  05/08/2011    Procedure: ESOPHAGOGASTRODUODENOSCOPY (EGD);  Surgeon: Beryle Beams;  Location: [facility];  Service: Endoscopy;  Laterality: N/A;  . Esophagogastroduodenoscopy  06/07/2011    Procedure: ESOPHAGOGASTRODUODENOSCOPY (EGD);  Surgeon: Beryle Beams, MD;  Location: [facility];  Service: Endoscopy;  Laterality: N/A;  . Hot hemostasis  06/07/2011    Procedure: HOT HEMOSTASIS (ARGON PLASMA COAGULATION/BICAP);  Surgeon: Beryle Beams, MD;  Location: [facility];  Service: Endoscopy;  Laterality: N/A;  . Esophagogastroduodenoscopy  12/20/2011    Procedure: ESOPHAGOGASTRODUODENOSCOPY (EGD);  Surgeon: Beryle Beams, MD;  Location: [facility];  Service: Endoscopy;  Laterality: N/A;  . Flexible sigmoidoscopy  12/21/2011    Procedure: FLEXIBLE SIGMOIDOSCOPY;  Surgeon: Beryle Beams, MD;  Location: [facility];   Service: Endoscopy;  Laterality: N/A;  . Carpal tunnel release      rt hand  . Partial colectomy  02/15/2012    Procedure: PARTIAL COLECTOMY;  Surgeon: Harl Bowie, MD;  Location: [facility];  Service: General;  Laterality: N/A;  . Laparotomy  02/16/2012    Procedure: EXPLORATORY LAPAROTOMY;  Surgeon: Edward Jolly, MD;  Location: [facility];  Service: General;  Laterality: N/A;  oversewing of anastomotic leak and rigid sigmoidoscopy  . Enteroscopy N/A 12/04/2012    Procedure: ENTEROSCOPY;  Surgeon: Beryle Beams, MD;  Location: [facility];  Service: Endoscopy;  Laterality: N/A;  . Enteroscopy N/A 12/18/2012    Procedure: ENTEROSCOPY;  Surgeon: Beryle Beams, MD;  Location: [facility];  Service: Endoscopy;  Laterality: N/A;  . Eye surgery Bilateral     cataracts  . Tonsillectomy    . Abdominal hysterectomy    . Insertion of mesh N/A 06/01/2014    Procedure: INSERTION OF MESH;  Surgeon: Coralie Keens, MD;  Location: [facility];  Service: General;  Laterality: N/A;  . Incisional hernia repair N/A 06/01/2014    Procedure: ATTEMPTED LAPAROSCOPIC AND OPEN INCISIONAL HERNIA REPAIR WITH MESH;  Surgeon: Coralie Keens, MD;  Location: [facility];  Service: General;  Laterality: N/A;  . Laparotomy N/A 06/21/2014    Procedure: ABDOMINAL WOUND EXPLORATION;  Surgeon: Coralie Keens, MD;  Location: [facility];  Service: General;  Laterality: N/A;  . Application of wound vac N/A 06/21/2014    Procedure: APPLICATION OF WOUND VAC;  Surgeon: Coralie Keens, MD;  Location:  [facility];  Service: General;  Laterality: N/A;  . Enteroscopy N/A 02/25/2015    Procedure: ENTEROSCOPY;  Surgeon: Inda Castle, MD;  Location: [facility];  Service: Endoscopy;  Laterality: N/A;     Medication List       This list is accurate as of: 10/03/15  8:31 AM.  Always use your most recent med list.               amLODipine 10 MG tablet  Commonly known as:  NORVASC  Take 10 mg by mouth daily.     aspirin EC 81 MG tablet  Take  1 tablet (81 mg total) by mouth daily.     atenolol 50 MG tablet  Commonly known as:  TENORMIN  TAKE 1 TABLET BY MOUTH EVERY MORNING     cetirizine 10 MG tablet  Commonly known as:  ZYRTEC  TAKE 1 TABLET BY MOUTH DAILY     FLUoxetine 40 MG capsule  Commonly known as:  PROZAC  TAKE 1 CAPSULE BY MOUTH DAILY     gabapentin 400 MG capsule  Commonly known as:  NEURONTIN  TAKE 2 CAPSULES BY MOUTH THREE TIMES A DAY     hydrochlorothiazide 25 MG tablet  Commonly known as:  HYDRODIURIL  TAKE 1 TABLET BY MOUTH EVERY MORNING     lisinopril 40 MG tablet  Commonly known as:  PRINIVIL,ZESTRIL  TAKE 1 TABLET BY MOUTH EVERY MORNING     omeprazole 20 MG capsule  Commonly known as:  PRILOSEC  Take 1 capsule (20 mg total) by mouth daily.     potassium chloride 20 MEQ/15ML (10%) Soln  TAKE 15ML BY MOUTH EVERY DAY     traMADol 50 MG tablet  Commonly known as:  ULTRAM  Take 50 mg by mouth every 6 (six) hours as needed (for pain.).     traZODone 50 MG tablet  Commonly known as:  DESYREL  TAKE 1 TABLET BY MOUTH EVERY NIGHT AT BEDTIME     vitamin C 500 MG tablet  Commonly known as:  ASCORBIC ACID  Take 500 mg by mouth every morning.     Vitamin D (Cholecalciferol) 1000 units Tabs  Take 1 tablet daily         REVIEW OF SYSTEMS:   Constitutional: Denies fevers, chills or abnormal weight loss; Reports mild fatigue.  Eyes: Denies blurriness of vision Ears, nose, mouth, throat, and face: Denies mucositis or sore throat Respiratory: Denies cough, dyspnea or wheezes Cardiovascular: Denies palpitation, chest discomfort or lower extremity swelling Gastrointestinal:  Denies nausea, heartburn or change in bowel habits, hernia is painful and bleeds sometime the overlying skin. Skin: Denies abnormal skin rashes Lymphatics: Denies new lymphadenopathy or easy bruising Neurological:Denies numbness, tingling or new weaknesses Behavioral/Psych: Mood is stable, no new changes  All other systems  were reviewed with the patient and are negative.  PHYSICAL EXAMINATION: ECOG PERFORMANCE STATUS: 2 BP 112/92 mmHg  Pulse 81  Temp(Src) 98.4 F (36.9 C) (Oral)  Resp 18  Ht 5\' 3"  (1.6 m)  Wt   SpO2 100% General appearance: alert, appears stated age, no distress and moderately obese  Skin: she has multiple skin pigmentations from previous rash on her arms and legs. She is a 1cm size shadow skin ulcer in the right lower extremity below knee, dry, no discharge, no surrounding skin erythema, and nontender. Head: Normocephalic, without obvious abnormality, atraumatic  Neck: no adenopathy, supple, symmetrical, trachea midline and thyroid not enlarged, symmetric, no tenderness/mass/nodules  Lymph  nodes: Cervical adenopathy: None appreciated  Heart:regular rate and rhythm, S1, S2 normal and systolic murmur: systolic ejection 2/6, harsh and previously documented without radiation  Lung:chest clear, no wheezing, rales, normal symmetric air entry, Heart exam - S1, S2 normal, no murmur, no gallop, rate regular  Abdomin: soft, distended, normal bowel sounds, nontender, (+) abdominal surgical wound has small discharge.   EXT:No peripheral edema on right extremity; Left above the knee amputation. She is wearing a prothesis.   Labs:  CBC Latest Ref Rng 09/27/2015 09/23/2015 08/30/2015  WBC 3.9 - 10.3 10e3/uL 8.7 10.8(H) 9.6  Hemoglobin 11.6 - 15.9 g/dL 8.5(L) 7.6(L) 9.5(L)  Hematocrit 34.8 - 46.6 % 28.1(L) 24.5(L) 29.7(L)  Platelets 145 - 400 10e3/uL 282 416(H) 373    CMP Latest Ref Rng 09/27/2015 09/23/2015 08/30/2015  Glucose 70 - 140 mg/dl 106 132(H) 124  BUN 7.0 - 26.0 mg/dL 17.6 24(H) 19.9  Creatinine 0.6 - 1.1 mg/dL 1.1 1.10(H) 1.2(H)  Sodium 136 - 145 mEq/L 139 139 140  Potassium 3.5 - 5.1 mEq/L 4.2 4.3 4.5  Chloride 101 - 111 mmol/L - 104 -  CO2 22 - 29 mEq/L 28 24 24   Calcium 8.4 - 10.4 mg/dL 9.2 9.1 8.8  Total Protein 6.4 - 8.3 g/dL 7.4 - 7.3  Total Bilirubin 0.20 - 1.20 mg/dL <0.30 - <0.30   Alkaline Phos 40 - 150 U/L 79 - 82  AST 5 - 34 U/L 22 - 25  ALT 0 - 55 U/L 15 - 16   Results for PELAGIA, SITZMAN (MRN [id number]) as of 10/05/2015 22:58  Ref. Range 07/28/2015 15:10 09/27/2015 14:48  Iron Latest Ref Range: 41-142 ug/dL 22 (L) 16 (L)  UIBC Latest Ref Range: 120-384 ug/dL 458 (H) 487 (H)  TIBC Latest Ref Range: 236-444 ug/dL 480 (H) 503 (H)  %SAT Latest Ref Range: 21-57 % 5 (L) 3 (L)  Ferritin Latest Ref Range: 9-269 ng/ml 5 (L) 18     RADIOGRAPHIC STUDIES: Mm Digital Screening Bilateral  07/31/2013   CLINICAL DATA:  Screening.  EXAM: DIGITAL SCREENING BILATERAL MAMMOGRAM WITH CAD  COMPARISON:  Previous exam(s).  ACR Breast Density Category c: The breast tissue is heterogeneously dense, which may obscure small masses.  FINDINGS: There are no findings suspicious for malignancy. Images were processed with CAD.  IMPRESSION: No mammographic evidence of malignancy. A result letter of this screening mammogram will be mailed directly to the patient.  RECOMMENDATION: Screening mammogram in one year. (Code:SM-B-01Y)  BI-RADS CATEGORY  1: Negative.   Electronically Signed   By: Abelardo Diesel M.D.   On: 07/31/2013 09:01   ASSESSMENT: [name] 65 y.o. female with a history of Iron deficiency anemia due to chronic blood loss  Porphyria cutanea tarda (HCC)  Hepatic cirrhosis, unspecified hepatic cirrhosis type ([place])   PLAN:  1. Iron deficiency anemia secondary to GI loss -History of multiple GI bleeding in 2016, required blood transfusion, hospitalization and IV Feraheme -she still requires blood transfusion periodically  -due to her anemia with hb 8.5 (after transfusion), and low iron level, will give iv feraheme today, one dose only  -I recommend close follow-up, we'll check her CBC and ferritin every 3 weeks -Due to her PCT, we'll be cautious with IV iron. -She used to follow up with Dr. Deatra Ina at [facility], who has left the practice. She will see a new provider.   2.  Porphyria cutaneous tarda (PCT) -per pt, she had significant sun sensitivity which caused skin blisters on her  arms and legs, she has multiple skin pigmentations from healed skin rash on her arms. -Both her urine and serum porphyria, especially uroporphyrin and coproporphyrin all elevated.  -This is likely secondary to her hepatitis C -She Has completed treatment for hepatitis C. Her PCT has overall improved    3. chronic hepatitis C infection -She was tested positive for hepatitis C in 2009. She has completed treatment for hepatitis C and repeated test was negative.  -liver function has improved   4. DM2. --Management per PCP's office.  -overall well controlled   5. Left BKA -She has a prosthesis, she is wheelchair bound most of time, I encouraged her to continue physical therapy,   6. Smoking cessation -She is still actively smoking, I highly encourage her to quit smoking.  Plan  -IV Feraheme once today. -Lab with CBC and ferritin every 3 weeks, she will call me after lab test, I would consider IV Feraheme if her anemia gets worse (<9.0) along with low ferritin level  -I will see her back in 3 months  All questions were answered. The patient knows to call the clinic with any problems, questions or concerns. We can certainly see the patient much sooner if necessary.  I spent 20 minutes counseling the patient face to face. The total time spent in the appointment was 25 minutes.  Truitt Merle 10/03/2015

## 2015-10-03 NOTE — Telephone Encounter (Signed)
per pof to sch pt appt-pt to get updated copy in infusion room b4 leaving

## 2015-10-03 NOTE — Patient Instructions (Signed)

## 2015-10-12 DIAGNOSIS — G933 Postviral fatigue syndrome: Secondary | ICD-10-CM | POA: Diagnosis not present

## 2015-10-12 DIAGNOSIS — R32 Unspecified urinary incontinence: Secondary | ICD-10-CM | POA: Diagnosis not present

## 2015-10-14 MED FILL — traMADol HCL 50 MG TABS: 50 | 10 days supply | Qty: 40 | Fill #0

## 2015-10-25 ENCOUNTER — Other Ambulatory Visit: Payer: Medicaid Other

## 2015-10-28 ENCOUNTER — Ambulatory Visit (INDEPENDENT_AMBULATORY_CARE_PROVIDER_SITE_OTHER): Payer: Medicare Other | Admitting: Internal Medicine

## 2015-10-28 VITALS — BP 139/73 | HR 75 | Temp 98.2°F | Wt 204.1 lb

## 2015-10-28 DIAGNOSIS — F17210 Nicotine dependence, cigarettes, uncomplicated: Secondary | ICD-10-CM

## 2015-10-28 DIAGNOSIS — R05 Cough: Secondary | ICD-10-CM | POA: Diagnosis not present

## 2015-10-28 DIAGNOSIS — R058 Other specified cough: Secondary | ICD-10-CM

## 2015-10-28 DIAGNOSIS — I10 Essential (primary) hypertension: Secondary | ICD-10-CM | POA: Diagnosis not present

## 2015-10-28 DIAGNOSIS — Z0000 Encounter for general adult medical examination without abnormal findings: Secondary | ICD-10-CM

## 2015-10-28 MED ORDER — ZOSTER VACCINE LIVE 19400 UNT/0.65ML ~~LOC~~ SUSR: 0.6500 mL | Freq: Once | SUBCUTANEOUS | Status: DC

## 2015-10-28 MED ORDER — LOSARTAN POTASSIUM 50 MG PO TABS: 50.0000 mg | ORAL_TABLET | Freq: Every day | ORAL | Status: DC

## 2015-10-28 NOTE — Patient Instructions (Signed)
-  Will change your lisinopril to losartan 50 mg daily and see if your cough improves  -Will refill your ensure supplements  -Try aquaphor, cetaphil, or cerave lotions for your dry skin  -Please cut back on the cigarettes  -Please come back in 4-6 weeks to see if your cough is better -Will miss you, pleasure being your doctor!  General Instructions:   Please bring your medicines with you each time you come to clinic.  Medicines may include prescription medications, over-the-counter medications, herbal remedies, eye drops, vitamins, or other pills.   Progress Toward Treatment Goals:  Treatment Goal 05/14/2014  Blood pressure -  Stop smoking -  Prevent falls at goal    Self Care Goals & Plans:  Self Care Goal 04/01/2015  Manage my medications take my medicines as prescribed; refill my medications on time  Monitor my health -  Eat healthy foods -  Be physically active -  Stop smoking go to the [employer] ([email or web address]-booker.info/)    No flowsheet data found.   Care Management & Community Referrals:  Referral 05/14/2014  Referrals made for care management support [facility] case management program

## 2015-10-29 ENCOUNTER — Encounter: Payer: Self-pay | Admitting: Internal Medicine

## 2015-10-29 DIAGNOSIS — R05 Cough: Secondary | ICD-10-CM | POA: Insufficient documentation

## 2015-10-29 DIAGNOSIS — R058 Other specified cough: Secondary | ICD-10-CM | POA: Insufficient documentation

## 2015-10-29 NOTE — Progress Notes (Signed)
Patient ID: [name], female   DOB: 1951/05/10, 65 y.o.   MRN: [id number]    Subjective:   Patient ID: [name] female   DOB: 02/12/1951 65 y.o.   MRN: [id number]  HPI: [name] is a 65 y.o. woman with past medical history of hypertension, non-insulin dependent Type 2 DM, iron-deficiency anemia due to chronic GI blood loss requiring frequent IV iron infusions, chronic HCV infection s/p treatment, depression, CVA, and GERD who presents for follow-up of blood pressure.  She reports compliance with taking atenolol, HCTZ, and lisinopril for hypertension and is now taking  amlodipine which she reported having nausea to at last visit. She has chronic right LE edema and occasional headache but denies chest pain or lightheadedness.   She reports having a dry cough for the past 2 weeks. She denies recent cold. She is compliant with taking cetirizine for allergic rhinitis and omeprazole for GERD.She smokes 1 pack of cigarettes every 5 days. Chest xray on 09/24/15 was normal. She denies history of asthma or COPD and has never had PFT's. She is on lisinopril 40 mg daily for hypertension.   She would like to have zoster vaccination. She is not immunocompromised.    Past Medical History  Diagnosis Date  . Hypertension   . Colon polyps   . Hypokalemic alkalosis   . Allergic rhinitis   . GERD (gastroesophageal reflux disease)   . Stroke [facility]) 2010    no residual problems  . Anemia, iron deficiency 05/03/2011    recieves iron infusions as well as periodic red blood cell transfusions  . GI bleeding     Has had bleeding from small bowel AVMs as well as from colon diverticulosis.  . Diverticulosis   . Difficulty sleeping     takes trazadone for sleep  . Peripheral vascular disease ([place])   . Hepatitis C     Began Harvoni early 82016. completion date late 02/2015.   Marland Kitchen Anxiety   . Diabetes mellitus without complication ([place])   . PCT (porphyria cutanea tarda) ([place])    Current  Outpatient Prescriptions  Medication Sig Dispense Refill  . amLODipine (NORVASC) 10 MG tablet Take 10 mg by mouth daily.    Marland Kitchen aspirin EC 81 MG tablet Take 1 tablet (81 mg total) by mouth daily. 30 tablet 1  . atenolol (TENORMIN) 50 MG tablet TAKE 1 TABLET BY MOUTH EVERY MORNING 30 tablet 8  . cetirizine (ZYRTEC) 10 MG tablet TAKE 1 TABLET BY MOUTH DAILY 90 tablet 3  . FLUoxetine (PROZAC) 40 MG capsule TAKE 1 CAPSULE BY MOUTH DAILY 30 capsule 5  . gabapentin (NEURONTIN) 400 MG capsule TAKE 2 CAPSULES BY MOUTH THREE TIMES A DAY 180 capsule 2  . hydrochlorothiazide (HYDRODIURIL) 25 MG tablet TAKE 1 TABLET BY MOUTH EVERY MORNING 90 tablet 3  . losartan (COZAAR) 50 MG tablet Take 1 tablet (50 mg total) by mouth daily. 30 tablet 2  . omeprazole (PRILOSEC) 20 MG capsule Take 1 capsule (20 mg total) by mouth daily. 30 capsule 11  . potassium chloride 20 MEQ/15ML (10%) SOLN TAKE 15ML BY MOUTH EVERY DAY 450 mL 1  . traMADol (ULTRAM) 50 MG tablet Take 50 mg by mouth every 6 (six) hours as needed (for pain.).   0  . traZODone (DESYREL) 50 MG tablet TAKE 1 TABLET BY MOUTH EVERY NIGHT AT BEDTIME 30 tablet 4  . vitamin C (ASCORBIC ACID) 500 MG tablet Take 500 mg by mouth every morning.    Marland Kitchen  Vitamin D, Cholecalciferol, 1000 UNITS TABS Take 1 tablet daily 100 tablet 3  . Zoster Vaccine Live, PF, (ZOSTAVAX) 19147 UNT/0.65ML injection Inject 19,400 Units into the skin once. 1 each 0   No current facility-administered medications for this visit.   Family History  Problem Relation Age of Onset  . Cancer Father     unknown  . Hypertension Mother   . Diverticulitis Mother    Social History   Social History  . Marital Status: Widowed    Spouse Name: N/A  . Number of Children: 3  . Years of Education: N/A   Occupational History  . Retired    Social History Main Topics  . Smoking status: Current Every Day Smoker -- 0.20 packs/day for 17 years    Types: Cigarettes  . Smokeless tobacco: Never Used      Comment: wants to quit.  Smoking less 1 pk per week.  1-2 cigs per day  . Alcohol Use: 0.0 oz/week    0 Standard drinks or equivalent per week     Comment: Occasionally  . Drug Use: Yes    Special: Marijuana     Comment: occasional  . Sexual Activity: Yes    Birth Control/ Protection: Post-menopausal   Other Topics Concern  . Not on file   Social History Narrative   Review of Systems: Review of Systems  Constitutional: Negative for fever and chills.  HENT: Negative for congestion.   Eyes: Positive for blurred vision (chronic).  Respiratory: Positive for cough (dry for 2 weeks). Negative for sputum production, shortness of breath and wheezing.   Gastrointestinal: Negative for heartburn (controlled ), nausea, vomiting, abdominal pain, diarrhea and constipation.  Genitourinary: Negative for dysuria, urgency, frequency and hematuria.  Musculoskeletal: Positive for back pain. Negative for falls.  Skin: Negative for rash.  Neurological: Positive for sensory change (chronic perohperal neuropathy). Negative for headaches.  Endo/Heme/Allergies: Positive for environmental allergies.  Psychiatric/Behavioral: Positive for substance abuse (alcohol, cocaine, and tobacco).     Objective:  Physical Exam: Filed Vitals:   10/28/15 1411  BP: 139/73  Pulse: 75  Temp: 98.2 F (36.8 C)  TempSrc: Oral  Weight: 204 lb 1.6 oz (92.579 kg)  SpO2: 98%   Physical Exam  Constitutional: She is oriented to person, place, and time. She appears well-developed and well-nourished. No distress.  HENT:  Head: Normocephalic and atraumatic.  Right Ear: External ear normal.  Left Ear: External ear normal.  Nose: Nose normal.  Mouth/Throat: Oropharynx is clear and moist. No oropharyngeal exudate.  Eyes: Conjunctivae and EOM are normal. Pupils are equal, round, and reactive to light. Right eye exhibits no discharge. Left eye exhibits no discharge. No scleral icterus.  Neck: Normal range of motion. Neck  supple.  Cardiovascular: Normal rate, regular rhythm, normal heart sounds and intact distal pulses.   Pulmonary/Chest: Effort normal and breath sounds normal. No respiratory distress. She has no wheezes. She has no rales.  Abdominal: Soft. Bowel sounds are normal. She exhibits no distension. There is tenderness (periumbilical (chronic)). There is no rebound and no guarding.  Musculoskeletal: Normal range of motion. She exhibits edema (trace right LE). She exhibits no tenderness.  S/p left BKA with prosthesis on  Neurological: She is alert and oriented to person, place, and time.  Skin: Skin is warm and dry. No rash noted. She is not diaphoretic. No erythema. No pallor.  Healed scars on b/l UE  Psychiatric: She has a normal mood and affect. Judgment normal.    Assessment &  Plan:   Please see problem list for problem-based assessment and plan

## 2015-10-29 NOTE — Assessment & Plan Note (Signed)
Assessment: Pt is a chronic smoker with normal chest xray on 09/24/15 who presents with dry cough of 2 week duration.   Plan:  -Change lisinopril 40 mg daily to losartan 50 mg daily  -Pt instructed to cut back on smoking cigarettes  -Pt to return in 4-6 weeks if cough does not improve for work-up of chronic cough

## 2015-10-29 NOTE — Assessment & Plan Note (Addendum)
Assessment: Pt with moderately well-controlled hypertension compliant with four-class (BB, ACEi, CCB, and diuretic) anti-hypertensive therapy who presents with improved blood pressure of 139/73.   Plan:  -BP 139/73 at goal <140/90 -Continue atenolol 50 mg daily, HCTZ 25 mg daily, and amlodipine 10 mg daily -Change lisinopril 40 mg daily to losartan 50 mg daily due to cough  -Last CMP on 09/27/15 with reduced eGFR of 62, continue to monitor

## 2015-10-29 NOTE — Assessment & Plan Note (Addendum)
-  Right foot exam performed today  -Pt given prescription for zoster vaccine to obtain from local pharmacy, pt is not immunocompromised

## 2015-11-01 ENCOUNTER — Telehealth: Payer: Self-pay | Admitting: Hematology

## 2015-11-01 ENCOUNTER — Ambulatory Visit: Payer: Self-pay | Admitting: Hematology

## 2015-11-01 ENCOUNTER — Encounter: Payer: Self-pay | Admitting: Hematology

## 2015-11-01 ENCOUNTER — Other Ambulatory Visit: Payer: Medicaid Other

## 2015-11-01 ENCOUNTER — Other Ambulatory Visit: Payer: Self-pay | Admitting: *Deleted

## 2015-11-01 NOTE — Telephone Encounter (Signed)
spoke w/ pt confirmed lab only today, notified that MD apt has been cancelled per pof

## 2015-11-01 NOTE — Progress Notes (Signed)
[facility] Clinic Attending  Case discussed with Dr. Rabbani soon after the resident saw the patient.  We reviewed the resident's history and exam and pertinent patient test results.  I agree with the assessment, diagnosis, and plan of care documented in the resident's note.  

## 2015-11-01 NOTE — Telephone Encounter (Signed)
Cancelled apt with provider per pof

## 2015-11-01 NOTE — Telephone Encounter (Signed)
per pof to sch pt appt-pt had no show for labs on 5/30-labs sch every 3 weeks-moved labs to every 3 weeks from today per [facility]

## 2015-11-08 ENCOUNTER — Other Ambulatory Visit: Payer: Self-pay | Admitting: Internal Medicine

## 2015-11-09 DIAGNOSIS — E119 Type 2 diabetes mellitus without complications: Secondary | ICD-10-CM | POA: Diagnosis not present

## 2015-11-11 ENCOUNTER — Other Ambulatory Visit: Payer: Self-pay | Admitting: Internal Medicine

## 2015-11-11 DIAGNOSIS — G933 Postviral fatigue syndrome: Secondary | ICD-10-CM | POA: Diagnosis not present

## 2015-11-11 DIAGNOSIS — R32 Unspecified urinary incontinence: Secondary | ICD-10-CM | POA: Diagnosis not present

## 2015-11-14 ENCOUNTER — Telehealth: Payer: Self-pay | Admitting: Hematology

## 2015-11-14 ENCOUNTER — Other Ambulatory Visit (HOSPITAL_BASED_OUTPATIENT_CLINIC_OR_DEPARTMENT_OTHER): Payer: Medicare Other

## 2015-11-14 ENCOUNTER — Other Ambulatory Visit: Payer: Medicare Other

## 2015-11-14 DIAGNOSIS — D500 Iron deficiency anemia secondary to blood loss (chronic): Secondary | ICD-10-CM

## 2015-11-14 DIAGNOSIS — K31819 Angiodysplasia of stomach and duodenum without bleeding: Secondary | ICD-10-CM

## 2015-11-14 LAB — COMPREHENSIVE METABOLIC PANEL
ALT: 16 U/L (ref 0–55)
ANION GAP: 8 meq/L (ref 3–11)
AST: 21 U/L (ref 5–34)
Albumin: 3.3 g/dL — ABNORMAL LOW (ref 3.5–5.0)
Alkaline Phosphatase: 80 U/L (ref 40–150)
BUN: 21.2 mg/dL (ref 7.0–26.0)
CHLORIDE: 107 meq/L (ref 98–109)
CO2: 25 mEq/L (ref 22–29)
Calcium: 9.2 mg/dL (ref 8.4–10.4)
Creatinine: 1.2 mg/dL — ABNORMAL HIGH (ref 0.6–1.1)
EGFR: 55 mL/min/{1.73_m2} — AB (ref >90)
Glucose: 175 mg/dl — ABNORMAL HIGH (ref 70–140)
POTASSIUM: 4.7 meq/L (ref 3.5–5.1)
Sodium: 139 mEq/L (ref 136–145)
Total Bilirubin: <0.3 mg/dL (ref 0.20–1.20)
Total Protein: 7.3 g/dL (ref 6.4–8.3)

## 2015-11-14 LAB — CBC WITH DIFFERENTIAL/PLATELET
BASO%: 0.6 % (ref 0.0–2.0)
Basophils Absolute: 0.1 10*3/uL (ref 0.0–0.1)
EOS%: 2.9 % (ref 0.0–7.0)
Eosinophils Absolute: 0.3 10*3/uL (ref 0.0–0.5)
HEMATOCRIT: 27.1 % — AB (ref 34.8–46.6)
HGB: 8.4 g/dL — ABNORMAL LOW (ref 11.6–15.9)
LYMPH#: 1.8 10*3/uL (ref 0.9–3.3)
LYMPH%: 19.9 % (ref 14.0–49.7)
MCH: 23.8 pg — ABNORMAL LOW (ref 25.1–34.0)
MCHC: 31.1 g/dL — AB (ref 31.5–36.0)
MCV: 76.6 fL — ABNORMAL LOW (ref 79.5–101.0)
MONO#: 0.9 10*3/uL (ref 0.1–0.9)
MONO%: 10.3 % (ref 0.0–14.0)
NEUT%: 66.3 % (ref 38.4–76.8)
NEUTROS ABS: 6.1 10*3/uL (ref 1.5–6.5)
Platelets: 414 10*3/uL — ABNORMAL HIGH (ref 145–400)
RBC: 3.54 10*6/uL — AB (ref 3.70–5.45)
RDW: 24.4 % — AB (ref 11.2–14.5)
WBC: 9.2 10*3/uL (ref 3.9–10.3)

## 2015-11-14 NOTE — Telephone Encounter (Signed)
Gave and pritned appt sched for pt

## 2015-11-16 ENCOUNTER — Other Ambulatory Visit: Payer: Self-pay | Admitting: Internal Medicine

## 2015-11-16 MED FILL — traMADol HCL 50 MG TABS: 50 | 10 days supply | Qty: 40 | Fill #0

## 2015-11-22 ENCOUNTER — Encounter: Payer: Self-pay | Admitting: *Deleted

## 2015-11-22 ENCOUNTER — Other Ambulatory Visit: Payer: Medicare Other

## 2015-12-05 ENCOUNTER — Other Ambulatory Visit (HOSPITAL_BASED_OUTPATIENT_CLINIC_OR_DEPARTMENT_OTHER): Payer: Medicare Other

## 2015-12-05 ENCOUNTER — Other Ambulatory Visit: Payer: Medicare Other

## 2015-12-05 ENCOUNTER — Other Ambulatory Visit: Payer: Self-pay | Admitting: Internal Medicine

## 2015-12-05 DIAGNOSIS — I10 Essential (primary) hypertension: Secondary | ICD-10-CM

## 2015-12-05 DIAGNOSIS — D500 Iron deficiency anemia secondary to blood loss (chronic): Secondary | ICD-10-CM

## 2015-12-05 DIAGNOSIS — E1142 Type 2 diabetes mellitus with diabetic polyneuropathy: Secondary | ICD-10-CM

## 2015-12-05 DIAGNOSIS — K31819 Angiodysplasia of stomach and duodenum without bleeding: Secondary | ICD-10-CM

## 2015-12-05 LAB — COMPREHENSIVE METABOLIC PANEL
ALK PHOS: 92 U/L (ref 40–150)
ALT: 12 U/L (ref 0–55)
AST: 18 U/L (ref 5–34)
Albumin: 3.4 g/dL — ABNORMAL LOW (ref 3.5–5.0)
Anion Gap: 10 mEq/L (ref 3–11)
BUN: 12.7 mg/dL (ref 7.0–26.0)
CHLORIDE: 104 meq/L (ref 98–109)
CO2: 24 mEq/L (ref 22–29)
Calcium: 8.9 mg/dL (ref 8.4–10.4)
Creatinine: 1 mg/dL (ref 0.6–1.1)
EGFR: 68 mL/min/{1.73_m2} — AB (ref >90)
GLUCOSE: 196 mg/dL — AB (ref 70–140)
POTASSIUM: 3.9 meq/L (ref 3.5–5.1)
SODIUM: 138 meq/L (ref 136–145)
Total Bilirubin: <0.3 mg/dL (ref 0.20–1.20)
Total Protein: 7.5 g/dL (ref 6.4–8.3)

## 2015-12-05 LAB — CBC WITH DIFFERENTIAL/PLATELET
BASO%: 0.3 % (ref 0.0–2.0)
BASOS ABS: 0 10*3/uL (ref 0.0–0.1)
EOS%: 2 % (ref 0.0–7.0)
Eosinophils Absolute: 0.2 10*3/uL (ref 0.0–0.5)
HCT: 24.9 % — ABNORMAL LOW (ref 34.8–46.6)
HEMOGLOBIN: 7.5 g/dL — AB (ref 11.6–15.9)
LYMPH#: 2.6 10*3/uL (ref 0.9–3.3)
LYMPH%: 22.8 % (ref 14.0–49.7)
MCH: 21.7 pg — AB (ref 25.1–34.0)
MCHC: 30.1 g/dL — AB (ref 31.5–36.0)
MCV: 72.2 fL — AB (ref 79.5–101.0)
MONO#: 1.1 10*3/uL — ABNORMAL HIGH (ref 0.1–0.9)
MONO%: 10.1 % (ref 0.0–14.0)
NEUT#: 7.3 10*3/uL — ABNORMAL HIGH (ref 1.5–6.5)
NEUT%: 64.8 % (ref 38.4–76.8)
Platelets: 437 10*3/uL — ABNORMAL HIGH (ref 145–400)
RBC: 3.45 10*6/uL — AB (ref 3.70–5.45)
RDW: 20.9 % — ABNORMAL HIGH (ref 11.2–14.5)
WBC: 11.3 10*3/uL — ABNORMAL HIGH (ref 3.9–10.3)
nRBC: 0 % (ref 0–0)

## 2015-12-06 LAB — IRON AND TIBC
%SAT: 3 % — ABNORMAL LOW (ref 21–57)
IRON: 15 ug/dL — AB (ref 41–142)
TIBC: 499 ug/dL — AB (ref 236–444)
UIBC: 484 ug/dL — AB (ref 120–384)

## 2015-12-06 LAB — FERRITIN: FERRITIN: 5 ng/mL — AB (ref 9–269)

## 2015-12-06 NOTE — Telephone Encounter (Signed)
I suspect this is an auto refill from her mail order pharmacy, she has an appointment with her PCP tomorrow so I will defer this to tomorrow in case changes are necessary

## 2015-12-07 ENCOUNTER — Other Ambulatory Visit: Payer: Self-pay | Admitting: Hematology

## 2015-12-07 ENCOUNTER — Encounter: Payer: Medicare Other | Admitting: Internal Medicine

## 2015-12-08 ENCOUNTER — Telehealth: Payer: Self-pay | Admitting: Hematology

## 2015-12-08 NOTE — Telephone Encounter (Signed)
Spoke with pt to confirm 7/15 appt and 7/21 appt per [facility] urgent pof

## 2015-12-09 ENCOUNTER — Ambulatory Visit (HOSPITAL_COMMUNITY): Payer: Medicare Other | Attending: Hematology

## 2015-12-09 DIAGNOSIS — E119 Type 2 diabetes mellitus without complications: Secondary | ICD-10-CM | POA: Diagnosis not present

## 2015-12-09 DIAGNOSIS — D500 Iron deficiency anemia secondary to blood loss (chronic): Secondary | ICD-10-CM | POA: Insufficient documentation

## 2015-12-10 ENCOUNTER — Ambulatory Visit: Payer: Medicare Other

## 2015-12-10 VITALS — BP 150/71 | HR 71 | Temp 97.0°F | Resp 20

## 2015-12-10 DIAGNOSIS — D500 Iron deficiency anemia secondary to blood loss (chronic): Secondary | ICD-10-CM

## 2015-12-10 LAB — PREPARE RBC (CROSSMATCH)

## 2015-12-10 MED ORDER — SODIUM CHLORIDE 0.9 % IV SOLN
250.0000 mL | Freq: Once | INTRAVENOUS | Status: AC
  Administered 2015-12-10: 250 mL via INTRAVENOUS

## 2015-12-10 MED ORDER — ACETAMINOPHEN 325 MG PO TABS
650.0000 mg | ORAL_TABLET | Freq: Once | ORAL | Status: AC
  Administered 2015-12-10: 650 mg via ORAL

## 2015-12-10 NOTE — Patient Instructions (Signed)

## 2015-12-10 NOTE — Progress Notes (Signed)
Late entry for 0900: Pt came in as scheduled for blood transfusion, no orders noted. Pt had not been crossmatched. Called Dr. Burr Medico, verbal order received for blood transfusion. Pt transported via wheelchair to [facility] lab for crossmatch.  Dr. Burr Medico called infusion room to clarify: Pt to receive one unit blood today.

## 2015-12-13 ENCOUNTER — Other Ambulatory Visit: Payer: Medicare Other

## 2015-12-14 ENCOUNTER — Telehealth: Payer: Self-pay | Admitting: Hematology

## 2015-12-14 LAB — TYPE AND SCREEN
ABO/RH(D): AB POS
Antibody Screen: NEGATIVE
UNIT DIVISION: 0
Unit division: 0

## 2015-12-14 MED FILL — traMADol HCL 50 MG TABS: 50 | 10 days supply | Qty: 40 | Fill #0

## 2015-12-14 NOTE — Telephone Encounter (Signed)
Received call from Carollee Sires. from [name] to confirm appointment for patient.

## 2015-12-16 ENCOUNTER — Other Ambulatory Visit: Payer: Self-pay | Admitting: *Deleted

## 2015-12-16 ENCOUNTER — Ambulatory Visit (HOSPITAL_BASED_OUTPATIENT_CLINIC_OR_DEPARTMENT_OTHER): Payer: Medicare Other

## 2015-12-16 VITALS — BP 135/78 | HR 75 | Temp 98.3°F | Resp 18

## 2015-12-16 DIAGNOSIS — D500 Iron deficiency anemia secondary to blood loss (chronic): Secondary | ICD-10-CM

## 2015-12-16 MED ORDER — FAMOTIDINE IN NACL 20-0.9 MG/50ML-% IV SOLN
INTRAVENOUS | Status: AC
  Filled 2015-12-16: qty 50

## 2015-12-16 MED ORDER — SODIUM CHLORIDE 0.9 % IV SOLN
510.0000 mg | Freq: Once | INTRAVENOUS | Status: AC
Start: 2015-12-16 — End: 2015-12-16
  Administered 2015-12-16: 510 mg via INTRAVENOUS
  Filled 2015-12-16: qty 17

## 2015-12-16 MED ORDER — DIPHENHYDRAMINE HCL 50 MG/ML IJ SOLN
INTRAMUSCULAR | Status: AC
  Filled 2015-12-16: qty 1

## 2015-12-16 MED ORDER — FAMOTIDINE IN NACL 20-0.9 MG/50ML-% IV SOLN
20.0000 mg | Freq: Once | INTRAVENOUS | Status: AC
Start: 2015-12-16 — End: 2015-12-16
  Administered 2015-12-16: 20 mg via INTRAVENOUS

## 2015-12-16 MED ORDER — DIPHENHYDRAMINE HCL 50 MG/ML IJ SOLN
50.0000 mg | Freq: Once | INTRAMUSCULAR | Status: AC
  Administered 2015-12-16: 50 mg via INTRAVENOUS

## 2015-12-16 NOTE — Patient Instructions (Signed)

## 2015-12-21 DIAGNOSIS — G933 Postviral fatigue syndrome: Secondary | ICD-10-CM | POA: Diagnosis not present

## 2015-12-21 DIAGNOSIS — R32 Unspecified urinary incontinence: Secondary | ICD-10-CM | POA: Diagnosis not present

## 2015-12-23 DIAGNOSIS — R1084 Generalized abdominal pain: Secondary | ICD-10-CM | POA: Diagnosis not present

## 2015-12-23 DIAGNOSIS — G8929 Other chronic pain: Secondary | ICD-10-CM | POA: Diagnosis not present

## 2015-12-23 MED FILL — traMADol HCL 50 MG TABS: 50 | 10 days supply | Qty: 40 | Fill #0

## 2015-12-24 ENCOUNTER — Other Ambulatory Visit: Payer: Self-pay | Admitting: Surgery

## 2015-12-24 DIAGNOSIS — R1084 Generalized abdominal pain: Principal | ICD-10-CM

## 2015-12-24 DIAGNOSIS — G8929 Other chronic pain: Secondary | ICD-10-CM

## 2015-12-26 ENCOUNTER — Other Ambulatory Visit (HOSPITAL_BASED_OUTPATIENT_CLINIC_OR_DEPARTMENT_OTHER): Payer: Medicare Other

## 2015-12-26 ENCOUNTER — Other Ambulatory Visit: Payer: Medicare Other

## 2015-12-26 DIAGNOSIS — D500 Iron deficiency anemia secondary to blood loss (chronic): Secondary | ICD-10-CM | POA: Diagnosis not present

## 2015-12-26 DIAGNOSIS — K31819 Angiodysplasia of stomach and duodenum without bleeding: Secondary | ICD-10-CM

## 2015-12-26 LAB — COMPREHENSIVE METABOLIC PANEL
ALBUMIN: 3.6 g/dL (ref 3.5–5.0)
ALK PHOS: 93 U/L (ref 40–150)
ALT: 18 U/L (ref 0–55)
AST: 26 U/L (ref 5–34)
Anion Gap: 9 mEq/L (ref 3–11)
BUN: 20 mg/dL (ref 7.0–26.0)
CALCIUM: 9.4 mg/dL (ref 8.4–10.4)
CO2: 23 mEq/L (ref 22–29)
Chloride: 107 mEq/L (ref 98–109)
Creatinine: 1 mg/dL (ref 0.6–1.1)
EGFR: 71 mL/min/{1.73_m2} — AB (ref >90)
GLUCOSE: 118 mg/dL (ref 70–140)
POTASSIUM: 4.6 meq/L (ref 3.5–5.1)
Sodium: 140 mEq/L (ref 136–145)
TOTAL PROTEIN: 7.7 g/dL (ref 6.4–8.3)

## 2015-12-26 LAB — CBC WITH DIFFERENTIAL/PLATELET
BASO%: 0.3 % (ref 0.0–2.0)
BASOS ABS: 0 10*3/uL (ref 0.0–0.1)
EOS ABS: 0.2 10*3/uL (ref 0.0–0.5)
EOS%: 1.8 % (ref 0.0–7.0)
HEMATOCRIT: 33.2 % — AB (ref 34.8–46.6)
HEMOGLOBIN: 10.1 g/dL — AB (ref 11.6–15.9)
LYMPH#: 2 10*3/uL (ref 0.9–3.3)
LYMPH%: 22.4 % (ref 14.0–49.7)
MCH: 23.5 pg — AB (ref 25.1–34.0)
MCHC: 30.4 g/dL — ABNORMAL LOW (ref 31.5–36.0)
MCV: 77.4 fL — AB (ref 79.5–101.0)
MONO#: 1.1 10*3/uL — AB (ref 0.1–0.9)
MONO%: 12.1 % (ref 0.0–14.0)
NEUT%: 63.4 % (ref 38.4–76.8)
NEUTROS ABS: 5.5 10*3/uL (ref 1.5–6.5)
PLATELETS: 347 10*3/uL (ref 145–400)
RBC: 4.29 10*6/uL (ref 3.70–5.45)
RDW: 27.7 % — AB (ref 11.2–14.5)
WBC: 8.8 10*3/uL (ref 3.9–10.3)

## 2015-12-30 ENCOUNTER — Ambulatory Visit
Admission: RE | Admit: 2015-12-30 | Discharge: 2015-12-30 | Disposition: A | Discharge status: Home / Self Care | Payer: Medicare Other | Source: Ambulatory Visit | Attending: Surgery | Admitting: Surgery

## 2015-12-30 DIAGNOSIS — G8929 Other chronic pain: Secondary | ICD-10-CM

## 2015-12-30 DIAGNOSIS — R1084 Generalized abdominal pain: Principal | ICD-10-CM

## 2015-12-30 DIAGNOSIS — R109 Unspecified abdominal pain: Secondary | ICD-10-CM | POA: Diagnosis not present

## 2015-12-30 MED ORDER — IOPAMIDOL (ISOVUE-300) INJECTION 61%
100.0000 mL | Freq: Once | INTRAVENOUS | Status: AC | PRN
  Administered 2015-12-30: 100 mL via INTRAVENOUS

## 2016-01-03 ENCOUNTER — Other Ambulatory Visit: Payer: Self-pay | Admitting: Internal Medicine

## 2016-01-03 ENCOUNTER — Other Ambulatory Visit: Payer: Medicare Other

## 2016-01-03 DIAGNOSIS — I10 Essential (primary) hypertension: Secondary | ICD-10-CM

## 2016-01-04 ENCOUNTER — Ambulatory Visit (INDEPENDENT_AMBULATORY_CARE_PROVIDER_SITE_OTHER): Payer: Medicare Other | Admitting: Internal Medicine

## 2016-01-04 ENCOUNTER — Encounter: Payer: Self-pay | Admitting: Internal Medicine

## 2016-01-04 ENCOUNTER — Encounter (INDEPENDENT_AMBULATORY_CARE_PROVIDER_SITE_OTHER): Payer: Self-pay

## 2016-01-04 VITALS — BP 135/90 | HR 87 | Temp 98.2°F | Wt 195.3 lb

## 2016-01-04 DIAGNOSIS — D649 Anemia, unspecified: Secondary | ICD-10-CM | POA: Diagnosis not present

## 2016-01-04 DIAGNOSIS — I10 Essential (primary) hypertension: Secondary | ICD-10-CM

## 2016-01-04 DIAGNOSIS — G546 Phantom limb syndrome with pain: Secondary | ICD-10-CM | POA: Diagnosis not present

## 2016-01-04 DIAGNOSIS — F17210 Nicotine dependence, cigarettes, uncomplicated: Secondary | ICD-10-CM

## 2016-01-04 DIAGNOSIS — Z0000 Encounter for general adult medical examination without abnormal findings: Secondary | ICD-10-CM

## 2016-01-04 NOTE — Assessment & Plan Note (Signed)
-  Patient received Zoster vaccine at outside pharmacy per Dr. Harley Hallmark script -Has Ophthalmologist appointment next week -due for annual lipid panel -Right foot exam performed 10/2015 -Chem profile + hepatic panel 11/2015 satisfactory -CBC 11/2015 stable chronic anemia  Plan: -f/u lipid panel if obtained from infusion clinic; if not done, will obtain at next visit; will decide on statin

## 2016-01-04 NOTE — Progress Notes (Signed)
   CC: blood pressure  HPI:  [name] is a 65 y.o.  Female with PMH of hypertension, non-insulin dependent DM Type 2, iron-deficiency anemia due to chronic GI blood loss from AVM's requiring frequent IV iron infusions, chronic HCV infection s/p treatment, depression, CVA, and GERD, who presents for follow-up of blood pressure.  Please see problem based charting for status of [name]'s chronic diseases.  Past Medical History:  Diagnosis Date  . Allergic rhinitis   . Anemia, iron deficiency 05/03/2011   recieves iron infusions as well as periodic red blood cell transfusions  . Anxiety   . Colon polyps   . Diabetes mellitus without complication ([place])   . Difficulty sleeping    takes trazadone for sleep  . Diverticulosis   . GERD (gastroesophageal reflux disease)   . GI bleeding    Has had bleeding from small bowel AVMs as well as from colon diverticulosis.  . Hepatitis C    Began Harvoni early 82016. completion date late 02/2015.   Marland Kitchen Hypertension   . Hypokalemic alkalosis   . PCT (porphyria cutanea tarda) ([place])   . Peripheral vascular disease ([place])   . Stroke [facility]) 2010   no residual problems    Review of Systems:   Review of Systems  Constitutional: Negative for chills and fever.  HENT: Negative for hearing loss and tinnitus.   Eyes: Negative for blurred vision and double vision.  Respiratory: Negative for cough and shortness of breath.   Cardiovascular: Positive for leg swelling. Negative for chest pain and palpitations.  Gastrointestinal: Negative for abdominal pain, blood in stool, constipation, diarrhea, heartburn, melena, nausea and vomiting.  Genitourinary: Negative for dysuria, frequency and hematuria.  Skin: Negative for itching and rash.  Neurological: Negative for dizziness, tingling, sensory change and headaches.       Phantom limb pain at night    Physical Exam:  Vitals:   01/04/16 1413  BP: 135/90  Pulse: 87  Temp: 98.2 F (36.8 C)    TempSrc: Oral  SpO2: 99%  Weight: 88.6 kg (195 lb 4.8 oz)   Constitutional: NAD, well developed CV: RRR, no murmurs, rubs or gallops Resp: CTAB, without wheezing or crackles, no increased work of breathing Abd: soft, obese, nondistended, nontender, +BS Ext: Left AKA well healed with small abrasion that is nondraining, non erythematous. 2+ pulses throughout, warm, 1+ pitting edema on R LE Skin: warm, dry, small nondraining, non erythematous abrasion on L AKA stump  Assessment & Plan:   See Encounters Tab for problem based charting.  Patient seen with Dr. Beryle Beams

## 2016-01-04 NOTE — Patient Instructions (Signed)
[name],  Today we talked about your neuropathy in you leg. We agreed that you would try and take Tylenol at night to help with the pain. Please take 2 pills every 4 to 6 hours as needed.  You are doing so great with taking care of your health and keeping on top of your care.   Please have your cholesterol (Lipid Panel) done by the infusion clinic; if it's not done, we will check it in October when we see you next.  It was very nice meeting you!  [name], MD

## 2016-01-04 NOTE — Assessment & Plan Note (Addendum)
Patient is on Gabapentin 400mg  TID and has been doing well with that regimen up until recently. She has begun experiencing breakthrough symptoms in the middle of the night with sensation of sharp shooting pain; she denies other neurological symptoms like numbness or weakness. When she has these episodes, she takes an extra gabapentin 200mg  in the middle of the night and that usually resolves her symptoms. She denies increased drowsiness and sedation from the extra dose. She stated that before actually changing her prescription, she would like to try taking an acetaminophen.   She states she has received new prosthesis for L leg and has been exercising daily with it.  Plan: -acetaminophen 325mg  1-2 tablets at night as needed -will reassess at next appointment -Encouraged continued exercise with new prosthesis

## 2016-01-04 NOTE — Progress Notes (Signed)
Medicine attending: I personally interviewed and briefly examined this patient on the day of the patient visit and reviewed pertinent clinical and ,laboratory data  with resident physician Dr. [name] and we discussed a management plan.

## 2016-01-04 NOTE — Telephone Encounter (Signed)
I refilled [name]'s potassium chloride and losartan prescriptions.  We spoke today at her appointment and she has been doing well with the addition of losartan and has maintained her blood pressure in the acceptable range. She no longer has a cough since stopping lisinopril.

## 2016-01-04 NOTE — Assessment & Plan Note (Addendum)
Patient well compliant with atenolol 50mg  daily, HCTZ 25mg  daily, Amlodipine 10mg  daily, and losartan 50mg  daily. She checks her blood pressure at home and states it's been doing ok. Has random high's up to [id number] systolic but on recheck they are lower to 140's. She was recently switched from lisinopril to losartan due to knew onset cough, which has resolved since the switch. She is tolerating her new medication well. She denies headaches, vision changes, dizziness, chest pain and shortness of breath. She received a new prosthetic leg for her left AKA and has been exercising with it daily, and is optimistic at the increased mobility and activity it will provide her. She admits to smoking about 3 cigarettes a day. Current BP in clinic is 135/90.  Plan: -Continue atenolol 50mg  daily, HCTZ 25mg  daily, Amlodipine 10mg  daily, and losartan 50mg  daily -continue checking bp at home -Due for annual lipid panel (last on 11/2015 with LDL 70, triglycerides 163). Will either check at infusion appointment or at next clinic visit. -encouraged patient on ongoing exercise as tolerated -encouraged smoking cessation

## 2016-01-13 DIAGNOSIS — Z961 Presence of intraocular lens: Secondary | ICD-10-CM | POA: Diagnosis not present

## 2016-01-13 DIAGNOSIS — H40013 Open angle with borderline findings, low risk, bilateral: Secondary | ICD-10-CM | POA: Diagnosis not present

## 2016-01-13 DIAGNOSIS — E119 Type 2 diabetes mellitus without complications: Secondary | ICD-10-CM | POA: Diagnosis not present

## 2016-01-13 LAB — HM DIABETES EYE EXAM

## 2016-01-16 ENCOUNTER — Other Ambulatory Visit (HOSPITAL_BASED_OUTPATIENT_CLINIC_OR_DEPARTMENT_OTHER): Payer: Medicare Other

## 2016-01-16 ENCOUNTER — Ambulatory Visit (HOSPITAL_BASED_OUTPATIENT_CLINIC_OR_DEPARTMENT_OTHER): Payer: Medicare Other | Admitting: Hematology

## 2016-01-16 ENCOUNTER — Encounter: Payer: Self-pay | Admitting: Hematology

## 2016-01-16 ENCOUNTER — Telehealth: Payer: Self-pay | Admitting: Hematology

## 2016-01-16 VITALS — BP 126/65 | HR 73 | Temp 97.9°F | Resp 17 | Ht 63.0 in | Wt 166.5 lb

## 2016-01-16 DIAGNOSIS — Z720 Tobacco use: Secondary | ICD-10-CM | POA: Diagnosis not present

## 2016-01-16 DIAGNOSIS — E119 Type 2 diabetes mellitus without complications: Secondary | ICD-10-CM

## 2016-01-16 DIAGNOSIS — D500 Iron deficiency anemia secondary to blood loss (chronic): Secondary | ICD-10-CM | POA: Diagnosis not present

## 2016-01-16 DIAGNOSIS — K31819 Angiodysplasia of stomach and duodenum without bleeding: Secondary | ICD-10-CM

## 2016-01-16 DIAGNOSIS — Z8639 Personal history of other endocrine, nutritional and metabolic disease: Secondary | ICD-10-CM

## 2016-01-16 DIAGNOSIS — Z8619 Personal history of other infectious and parasitic diseases: Secondary | ICD-10-CM

## 2016-01-16 LAB — CBC WITH DIFFERENTIAL/PLATELET
BASO%: 0.6 % (ref 0.0–2.0)
Basophils Absolute: 0 10*3/uL (ref 0.0–0.1)
EOS%: 2.6 % (ref 0.0–7.0)
Eosinophils Absolute: 0.2 10*3/uL (ref 0.0–0.5)
HEMATOCRIT: 33.8 % — AB (ref 34.8–46.6)
HGB: 10.5 g/dL — ABNORMAL LOW (ref 11.6–15.9)
LYMPH#: 1.7 10*3/uL (ref 0.9–3.3)
LYMPH%: 22.8 % (ref 14.0–49.7)
MCH: 24.4 pg — ABNORMAL LOW (ref 25.1–34.0)
MCHC: 31.1 g/dL — AB (ref 31.5–36.0)
MCV: 78.6 fL — ABNORMAL LOW (ref 79.5–101.0)
MONO#: 0.7 10*3/uL (ref 0.1–0.9)
MONO%: 10.3 % (ref 0.0–14.0)
NEUT%: 63.7 % (ref 38.4–76.8)
NEUTROS ABS: 4.6 10*3/uL (ref 1.5–6.5)
PLATELETS: 280 10*3/uL (ref 145–400)
RBC: 4.3 10*6/uL (ref 3.70–5.45)
RDW: 27.9 % — ABNORMAL HIGH (ref 11.2–14.5)
WBC: 7.3 10*3/uL (ref 3.9–10.3)

## 2016-01-16 LAB — COMPREHENSIVE METABOLIC PANEL
ALBUMIN: 3.3 g/dL — AB (ref 3.5–5.0)
ALT: 14 U/L (ref 0–55)
ANION GAP: 8 meq/L (ref 3–11)
AST: 19 U/L (ref 5–34)
Alkaline Phosphatase: 82 U/L (ref 40–150)
BUN: 18.4 mg/dL (ref 7.0–26.0)
CALCIUM: 9.2 mg/dL (ref 8.4–10.4)
CHLORIDE: 105 meq/L (ref 98–109)
CO2: 25 mEq/L (ref 22–29)
CREATININE: 1 mg/dL (ref 0.6–1.1)
EGFR: 67 mL/min/{1.73_m2} — AB (ref >90)
Glucose: 122 mg/dl (ref 70–140)
Potassium: 3.9 mEq/L (ref 3.5–5.1)
Sodium: 139 mEq/L (ref 136–145)
Total Protein: 7.1 g/dL (ref 6.4–8.3)

## 2016-01-16 NOTE — Progress Notes (Signed)
[place], MD [place] [address]  DIAGNOSIS: Iron deficiency anemia due to chronic blood loss - Plan: Ferritin  History of porphyria  History of hepatitis C.   PROBLEM LIST:  1. Recurrent iron-deficiency anemia secondary to gastrointestinal bleeding (as noted above). [name] also has required red cell transfusions in the past, most recently early December 2012. She underwent a small bowel enteroscopy by Dr. Dan Humphreys at [facility] on 08/13/2011. The enteroscope was passed up to 6 feet into the jejunum. He saw multiple AVMs, which were ablated. Two of the large AVMs bled during APC. These were in the 4th part of the duodenum very close to the ligament of Treitz. The remainder of the AVMs were in the proximal jejunum. In addition to the small bowel AV malformations, which were treated with APC, he saw some small esophageal varices which were not bleeding and also evidence of mild portal hypertensive gastropathy.  2. History of duodenal arteriovenous malformation.  3. History of hepatitis C infection.  4. Gastroesophageal reflux disease.  5. Depression.  6. Hypertension.  7. History of right Bell's palsy.  8. Status post left above-knee amputation secondary to necrotizing fasciitis 03/06/2009.  9. Neurodermatitis.  10. Previous history of alcohol usage.  11. Admission to the hospital from 12/20/2011 through 12/25/2011 for hematochezia felt to be secondary to a diverticular bleed, requiring 2 units of packed red cells.  12. Diverticulosis.  13. Elevated alpha fetoprotein, 72.3 on 11/19/2011 and 76.0 on 01/11/2012.  14. Abnormal MRI of the liver from 11/28/2011 showing diffuse and markedly low signal intensity throughout the liver as well as diffuse low signal intensity in the adrenal glands. 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 There were no findings for cirrhosis, portal hypertension, splenomegaly or ascites, not were there any worrisome enhancing liver lesions.  15. Systolic ejection murmur.   CHIEF COMPLAIN: Follow up anemia  PRIOR TREATMENT: The patient has been heavily dependent upon IV Feraheme. Over the past year or so she has been receiving about 1 Feraheme infusion monthly on average. The patient's most recent infusions of IV Feraheme 510 mg occurred on the following dates: 06/05/2011, 07/03/2011, 07/31/2011, 08/17/2011, 08/28/2011. The patient received IV Feraheme 1020 mg on 10/02/2011 01/30/2012, 06/06/2012 and 09/10/2012, 05/2013, 07/2013, 08/2013, 09/2013, 11/12/2013, 01/15/2014 and 03/16/2014, 09/16/2014 and 09/23/2014, 11/2014 , 02/24/2015,  04/07/2015, 08/03/2015, 10/03/2015, 12/16/2015  CURRENT TREATMENT: As noted above. Feraheme prn, will be cautious for iron supplement due to PCT  INTERVAL HISTORY:  [name] 65 y.o. female with history of recurrent iron-deficiency anemia due to GI bleeding is here for follow-up. GI bleeding is probably due to AV malformations and diverticulosis. She came to the clinic in a automobile wheelchair by herself today.    She is doing moderately well overall. She still has mild to moderate fatigue, able to function well at home. Her porphyria related skin rash has been less active this year, but still has mild recurrence with slight exposure. She denies overt GI bleeding or other signs of bleeding. No other new complaints. His last IV Feraheme infusion was a month ago.  MEDICAL HISTORY: Past Medical History:  Diagnosis Date  . Allergic rhinitis   . Anemia, iron deficiency 05/03/2011   recieves iron infusions as well as periodic red blood cell transfusions  . Anxiety   . Colon polyps   . Diabetes mellitus without  complication ([place])   . Difficulty sleeping    takes trazadone for sleep  . Diverticulosis   . GERD (gastroesophageal reflux disease)   . GI bleeding     Has had bleeding from small bowel AVMs as well as from colon diverticulosis.  . Hepatitis C    Began Harvoni early 82016. completion date late 02/2015.   Marland Kitchen Hypertension   . Hypokalemic alkalosis   . PCT (porphyria cutanea tarda) ([place])   . Peripheral vascular disease ([place])   . Stroke [facility]) 2010   no residual problems     ALLERGIES:  is allergic to ciprofloxacin; morphine and related; penicillins; codeine; lisinopril; and feraheme [ferumoxytol].  MEDICATIONS: has a current medication list which includes the following prescription(s): aspirin ec, atenolol, cetirizine, fluoxetine, gabapentin, hydrochlorothiazide, losartan, omeprazole, potassium chloride, tramadol, trazodone, vitamin c, vitamin d (cholecalciferol), and zoster vaccine live (pf).  SURGICAL HISTORY:  Past Surgical History:  Procedure Laterality Date  . ABDOMINAL HYSTERECTOMY    . APPLICATION OF WOUND VAC N/A 06/21/2014   Procedure: APPLICATION OF WOUND VAC;  Surgeon: Coralie Keens, MD;  Location: [place];  Service: General;  Laterality: N/A;  . CARPAL TUNNEL RELEASE     rt hand  . ENTEROSCOPY N/A 12/04/2012   Procedure: ENTEROSCOPY;  Surgeon: Beryle Beams, MD;  Location: [facility];  Service: Endoscopy;  Laterality: N/A;  . ENTEROSCOPY N/A 12/18/2012   Procedure: ENTEROSCOPY;  Surgeon: Beryle Beams, MD;  Location: [facility];  Service: Endoscopy;  Laterality: N/A;  . ENTEROSCOPY N/A 02/25/2015   Procedure: ENTEROSCOPY;  Surgeon: Inda Castle, MD;  Location: [facility] ENDOSCOPY;  Service: Endoscopy;  Laterality: N/A;  . ESOPHAGOGASTRODUODENOSCOPY  05/05/2011   Procedure: ESOPHAGOGASTRODUODENOSCOPY (EGD);  Surgeon: Zenovia Jarred, MD;  Location: [facility];  Service: Gastroenterology;  Laterality: N/A;  Dr. Hilarie Fredrickson will do procedure for Dr. Benson Norway Saturday.  . ESOPHAGOGASTRODUODENOSCOPY  05/08/2011   Procedure: ESOPHAGOGASTRODUODENOSCOPY (EGD);  Surgeon: Beryle Beams;  [facility];  Service: Endoscopy;  Laterality:  N/A;  . ESOPHAGOGASTRODUODENOSCOPY  06/07/2011   Procedure: ESOPHAGOGASTRODUODENOSCOPY (EGD);  Surgeon: Beryle Beams, MD;  Location: [facility];  Service: Endoscopy;  Laterality: N/A;  . ESOPHAGOGASTRODUODENOSCOPY  12/20/2011   Procedure: ESOPHAGOGASTRODUODENOSCOPY (EGD);  Surgeon: Beryle Beams, MD;  Location: [facility];  Service: Endoscopy;  Laterality: N/A;  . EYE SURGERY Bilateral    cataracts  . FLEXIBLE SIGMOIDOSCOPY  12/21/2011   Procedure: FLEXIBLE SIGMOIDOSCOPY;  Surgeon: Beryle Beams, MD;  [facility];  Service: Endoscopy;  Laterality: N/A;  . HOT HEMOSTASIS  06/07/2011   Procedure: HOT HEMOSTASIS (ARGON PLASMA COAGULATION/BICAP);  Surgeon: Beryle Beams, MD;  Location: [facility];  Service: Endoscopy;  Laterality: N/A;  . INCISIONAL HERNIA REPAIR N/A 06/01/2014   Procedure: ATTEMPTED LAPAROSCOPIC AND OPEN INCISIONAL HERNIA REPAIR WITH MESH;  Surgeon: Coralie Keens, MD;  Location: [place];  Service: General;  Laterality: N/A;  . INSERTION OF MESH N/A 06/01/2014   Procedure: INSERTION OF MESH;  Surgeon: Coralie Keens, MD;  Location: [place];  Service: General;  Laterality: N/A;  . LAPAROTOMY  02/16/2012   Procedure: EXPLORATORY LAPAROTOMY;  Surgeon: Edward Jolly, MD;  Location: [facility];  Service: General;  Laterality: N/A;  oversewing of anastomotic leak and rigid sigmoidoscopy  . LAPAROTOMY N/A 06/21/2014   Procedure: ABDOMINAL WOUND EXPLORATION;  Surgeon: Coralie Keens, MD;  Location: [place];  Service: General;  Laterality: N/A;  . LEG AMPUTATION ABOVE KNEE    . PARTIAL COLECTOMY  02/15/2012  Procedure: PARTIAL COLECTOMY;  Surgeon: Harl Bowie, MD;  Location: [facility];  Service: General;  Laterality: N/A;  . TONSILLECTOMY       Medication List       Accurate as of 01/16/16  4:04 PM. Always use your most recent med list.          aspirin EC 81 MG tablet Take 1 tablet (81 mg total) by mouth daily.   atenolol 50 MG tablet Commonly known as:   TENORMIN TAKE 1 TABLET BY MOUTH EVERY MORNING   cetirizine 10 MG tablet Commonly known as:  ZYRTEC TAKE 1 TABLET BY MOUTH DAILY   FLUoxetine 40 MG capsule Commonly known as:  PROZAC TAKE 1 CAPSULE BY MOUTH DAILY   gabapentin 400 MG capsule Commonly known as:  NEURONTIN TAKE 2 CAPSULES BY MOUTH THREE TIMES A DAY   hydrochlorothiazide 25 MG tablet Commonly known as:  HYDRODIURIL TAKE 1 TABLET BY MOUTH EVERY MORNING   losartan 50 MG tablet Commonly known as:  COZAAR TAKE 1 TABLET BY MOUTH EVERY DAY   omeprazole 20 MG capsule Commonly known as:  PRILOSEC TAKE 1 CAPSULE BY MOUTH EVERY DAY **STOP NEXIUM**   potassium chloride 20 MEQ/15ML (10%) Soln TAKE 15ML BY MOUTH EVERY DAY   traMADol 50 MG tablet Commonly known as:  ULTRAM Take 50 mg by mouth every 6 (six) hours as needed (for pain.).   traZODone 50 MG tablet Commonly known as:  DESYREL TAKE 1 TABLET BY MOUTH EVERY NIGHT AT BEDTIME   vitamin C 500 MG tablet Commonly known as:  ASCORBIC ACID Take 500 mg by mouth every morning.   Vitamin D (Cholecalciferol) 1000 units Tabs Take 1 tablet daily   Zoster Vaccine Live (PF) 19400 UNT/0.65ML injection Commonly known as:  ZOSTAVAX Inject 19,400 Units into the skin once.        REVIEW OF SYSTEMS:   Constitutional: Denies fevers, chills or abnormal weight loss; Reports mild fatigue.  Eyes: Denies blurriness of vision Ears, nose, mouth, throat, and face: Denies mucositis or sore throat Respiratory: Denies cough, dyspnea or wheezes Cardiovascular: Denies palpitation, chest discomfort or lower extremity swelling Gastrointestinal:  Denies nausea, heartburn or change in bowel habits, hernia is painful and bleeds sometime the overlying skin. Skin: Denies abnormal skin rashes Lymphatics: Denies new lymphadenopathy or easy bruising Neurological:Denies numbness, tingling or new weaknesses Behavioral/Psych: Mood is stable, no new changes  All other systems were reviewed with  the patient and are negative.  PHYSICAL EXAMINATION: ECOG PERFORMANCE STATUS: 2 BP 126/65 (BP Location: Left Arm, Patient Position: Sitting)   Pulse 73   Temp 97.9 F (36.6 C) (Oral)   Resp 17   Ht 5\' 3"  (1.6 m)   Wt 166 lb 8 oz (75.5 kg) Comment: her wt without leg  SpO2 100%   BMI 29.49 kg/m  General appearance: alert, appears stated age, no distress and moderately obese  Skin: she has multiple skin pigmentations from previous rash on her arms and legs. She is a 1cm size shadow skin ulcer in the right lower extremity below knee, dry, no discharge, no surrounding skin erythema, and nontender. Head: Normocephalic, without obvious abnormality, atraumatic  Neck: no adenopathy, supple, symmetrical, trachea midline and thyroid not enlarged, symmetric, no tenderness/mass/nodules  Lymph nodes: Cervical adenopathy: None appreciated  Heart:regular rate and rhythm, S1, S2 normal and systolic murmur: systolic ejection 2/6, harsh and previously documented without radiation  Lung:chest clear, no wheezing, rales, normal symmetric air entry, Heart exam - S1, S2  normal, no murmur, no gallop, rate regular  Abdomin: soft, distended, normal bowel sounds, nontender, (+) abdominal surgical wound has small discharge.   EXT:No peripheral edema on right extremity; Left above the knee amputation. She is wearing a prothesis.   Labs:  CBC Latest Ref Rng & Units 01/16/2016 12/26/2015 12/05/2015  WBC 3.9 - 10.3 10e3/uL 7.3 8.8 11.3(H)  Hemoglobin 11.6 - 15.9 g/dL 10.5(L) 10.1(L) 7.5(L)  Hematocrit 34.8 - 46.6 % 33.8(L) 33.2(L) 24.9(L)  Platelets 145 - 400 10e3/uL 280 347 437(H)    CMP Latest Ref Rng & Units 01/16/2016 12/26/2015 12/05/2015  Glucose 70 - 140 mg/dl 122 118 196(H)  BUN 7.0 - 26.0 mg/dL 18.4 20.0 12.7  Creatinine 0.6 - 1.1 mg/dL 1.0 1.0 1.0  Sodium 136 - 145 mEq/L 139 140 138  Potassium 3.5 - 5.1 mEq/L 3.9 4.6 3.9  Chloride 101 - 111 mmol/L - - -  CO2 22 - 29 mEq/L 25 23 24   Calcium 8.4 - 10.4  mg/dL 9.2 9.4 8.9  Total Protein 6.4 - 8.3 g/dL 7.1 7.7 7.5  Total Bilirubin 0.20 - 1.20 mg/dL <0.30 <0.30 <0.30  Alkaline Phos 40 - 150 U/L 82 93 92  AST 5 - 34 U/L 19 26 18   ALT 0 - 55 U/L 14 18 12    Results for [name], [name] (MRN [id number]) as of 01/16/2016 07:02  Ref. Range 09/27/2015 14:48 12/05/2015 15:19  Iron Latest Ref Range: 41 - 142 ug/dL 16 (L) 15 (L)  UIBC Latest Ref Range: 120 - 384 ug/dL 487 (H) 484 (H)  TIBC Latest Ref Range: 236 - 444 ug/dL 503 (H) 499 (H)  %SAT Latest Ref Range: 21 - 57 % 3 (L) 3 (L)  Ferritin Latest Ref Range: 9 - 269 ng/ml 18 5 (L)    RADIOGRAPHIC STUDIES: Mm Digital Screening Bilateral  07/31/2013   CLINICAL DATA:  Screening.  EXAM: DIGITAL SCREENING BILATERAL MAMMOGRAM WITH CAD  COMPARISON:  Previous exam(s).  ACR Breast Density Category c: The breast tissue is heterogeneously dense, which may obscure small masses.  FINDINGS: There are no findings suspicious for malignancy. Images were processed with CAD.  IMPRESSION: No mammographic evidence of malignancy. A result letter of this screening mammogram will be mailed directly to the patient.  RECOMMENDATION: Screening mammogram in one year. (Code:SM-B-01Y)  BI-RADS CATEGORY  1: Negative.   Electronically Signed   By: Abelardo Diesel M.D.   On: 07/31/2013 09:01   ASSESSMENT: [name] 65 y.o. female with a history of Iron deficiency anemia due to chronic blood loss - Plan: Ferritin  History of porphyria  History of hepatitis C   PLAN:  1. Iron deficiency anemia secondary to GI loss -History of multiple GI bleeding in 2016, required blood transfusion, hospitalization and IV Feraheme -she still requires blood transfusion periodically  -She has been receiving IV Feraheme every 2 months on average lately -I recommend close follow-up, we'll check her CBC and ferritin every 4 weeks -Due to her PCT, we'll be cautious with IV iron. Her PCP has improved lately due to her successfully treated hepatitis  C -Will give IV Feraheme if her hemoglobin drops below 10 with low ferritin level  -My goal is to keep her Hb above 10   2. Porphyria cutaneous tarda (PCT) -per pt, she had significant sun sensitivity which caused skin blisters on her arms and legs, she has multiple skin pigmentations from healed skin rash on her arms. -Both her urine and serum porphyria, especially uroporphyrin and  coproporphyrin all elevated.  -This is likely secondary to her hepatitis C -She Has completed treatment for hepatitis C. Her PCT has overall improved    3. chronic hepatitis C infection -She was tested positive for hepatitis C in 2009. She has completed treatment for hepatitis C in 2016 and repeated test was negative.  -liver function has improved   4. DM2. --Management per PCP's office. Off metformin now  -overall well controlled   5. Left BKA -She has a prosthesis, she is wheelchair bound most of time, I encouraged her to continue physical therapy,   6. Smoking cessation -She is still actively smoking, not ready to quit   Plan  -Lab results reviewed with her, hemoglobin 10.5 today  -Lab with CBC and ferritin every 4 weeks, she will call me after lab test, I would consider IV Feraheme if her anemia gets worse (<10.0) along with low ferritin level  -I'll set up IV Feraheme once in 5 weeks -I will see her back in 4 months  All questions were answered. The patient knows to call the clinic with any problems, questions or concerns. We can certainly see the patient much sooner if necessary.  I spent 20 minutes counseling the patient face to face. The total time spent in the appointment was 25 minutes.  Truitt Merle 01/16/2016

## 2016-01-16 NOTE — Telephone Encounter (Signed)
Spoke with patient re next appointments for 9/18 and mailed remaining scheduling and avs per patient request.

## 2016-01-19 DIAGNOSIS — R32 Unspecified urinary incontinence: Secondary | ICD-10-CM | POA: Diagnosis not present

## 2016-01-19 DIAGNOSIS — G933 Postviral fatigue syndrome: Secondary | ICD-10-CM | POA: Diagnosis not present

## 2016-01-20 DIAGNOSIS — E119 Type 2 diabetes mellitus without complications: Secondary | ICD-10-CM | POA: Diagnosis not present

## 2016-01-21 ENCOUNTER — Telehealth: Payer: Self-pay | Admitting: Hematology

## 2016-01-21 NOTE — Telephone Encounter (Signed)
Received note from desk nurse re patient needing help moving all appointments to after 2 pm. Appointments adjusted accordingly. Called patient to confirm and was not able to reach patient or leave message. Mailed new scheduled for September thru November.

## 2016-01-25 ENCOUNTER — Encounter: Payer: Self-pay | Admitting: *Deleted

## 2016-01-25 ENCOUNTER — Telehealth: Payer: Self-pay | Admitting: *Deleted

## 2016-01-25 ENCOUNTER — Telehealth: Payer: Self-pay | Admitting: Hematology

## 2016-01-25 ENCOUNTER — Other Ambulatory Visit: Payer: Self-pay | Admitting: *Deleted

## 2016-01-25 DIAGNOSIS — D500 Iron deficiency anemia secondary to blood loss (chronic): Secondary | ICD-10-CM

## 2016-01-25 NOTE — Telephone Encounter (Signed)
Patient called and left message.  She is upset b/c she thought she had an appt.today.  She feels weak and has no energy. She would like lab work.   She denies any bleeding.   Offered for her to come in today, but she has no transportation.   Patient given appt. For tomorrow at 2:30 b/c she thinks that is what time transportation can bring her.  Let her know that if she can come earlier, we can certainly work her in for lab and to see  Selena Lesser NP.   Also let her know that if she becomes weaker or SOB and doesn't feel like she can wait until tomorrow, she can go to ED.

## 2016-01-25 NOTE — Telephone Encounter (Signed)
Patient called to verify appointment for labs for 01/26/2016. No scheduled appointments are showing for the patient. Verified that the patient was the correct patient and tried to reach Dr. Ernestina Penna nurse to speak with her about the patient. Advised patient to leave a detailed message with Dr. Ernestina Penna nurse.

## 2016-01-26 ENCOUNTER — Other Ambulatory Visit (HOSPITAL_BASED_OUTPATIENT_CLINIC_OR_DEPARTMENT_OTHER): Payer: Medicare Other

## 2016-01-26 ENCOUNTER — Ambulatory Visit (HOSPITAL_BASED_OUTPATIENT_CLINIC_OR_DEPARTMENT_OTHER): Payer: Medicare Other | Admitting: Nurse Practitioner

## 2016-01-26 VITALS — BP 157/87 | HR 99 | Temp 98.8°F | Resp 17 | Ht 63.0 in | Wt 170.0 lb

## 2016-01-26 DIAGNOSIS — K922 Gastrointestinal hemorrhage, unspecified: Secondary | ICD-10-CM

## 2016-01-26 DIAGNOSIS — D500 Iron deficiency anemia secondary to blood loss (chronic): Secondary | ICD-10-CM

## 2016-01-26 LAB — CBC WITH DIFFERENTIAL/PLATELET
BASO%: 0.8 % (ref 0.0–2.0)
BASOS ABS: 0.1 10*3/uL (ref 0.0–0.1)
EOS ABS: 0.2 10*3/uL (ref 0.0–0.5)
EOS%: 2.3 % (ref 0.0–7.0)
HCT: 33.5 % — ABNORMAL LOW (ref 34.8–46.6)
HGB: 10.6 g/dL — ABNORMAL LOW (ref 11.6–15.9)
LYMPH%: 22.3 % (ref 14.0–49.7)
MCH: 24.5 pg — AB (ref 25.1–34.0)
MCHC: 31.7 g/dL (ref 31.5–36.0)
MCV: 77.2 fL — AB (ref 79.5–101.0)
MONO#: 0.9 10*3/uL (ref 0.1–0.9)
MONO%: 10.4 % (ref 0.0–14.0)
NEUT#: 5.3 10*3/uL (ref 1.5–6.5)
NEUT%: 64.2 % (ref 38.4–76.8)
Platelets: 338 10*3/uL (ref 145–400)
RBC: 4.35 10*6/uL (ref 3.70–5.45)
RDW: 25.8 % — ABNORMAL HIGH (ref 11.2–14.5)
WBC: 8.2 10*3/uL (ref 3.9–10.3)
lymph#: 1.8 10*3/uL (ref 0.9–3.3)

## 2016-01-26 LAB — IRON AND TIBC
%SAT: 9 % — AB (ref 21–57)
IRON: 40 ug/dL — AB (ref 41–142)
TIBC: 458 ug/dL — ABNORMAL HIGH (ref 236–444)
UIBC: 417 ug/dL — ABNORMAL HIGH (ref 120–384)

## 2016-01-26 LAB — FERRITIN: FERRITIN: 13 ng/mL (ref 9–269)

## 2016-01-27 ENCOUNTER — Encounter: Payer: Self-pay | Admitting: Nurse Practitioner

## 2016-01-27 ENCOUNTER — Ambulatory Visit (HOSPITAL_BASED_OUTPATIENT_CLINIC_OR_DEPARTMENT_OTHER): Payer: Medicare Other

## 2016-01-27 VITALS — BP 157/69 | HR 75 | Temp 98.5°F | Resp 17

## 2016-01-27 DIAGNOSIS — D500 Iron deficiency anemia secondary to blood loss (chronic): Secondary | ICD-10-CM

## 2016-01-27 DIAGNOSIS — K922 Gastrointestinal hemorrhage, unspecified: Secondary | ICD-10-CM | POA: Diagnosis not present

## 2016-01-27 MED ORDER — FAMOTIDINE 20 MG PO TABS: 20.0000 mg | ORAL_TABLET | Freq: Once | ORAL | Status: DC

## 2016-01-27 MED ORDER — FAMOTIDINE IN NACL 20-0.9 MG/50ML-% IV SOLN
INTRAVENOUS | Status: AC
  Filled 2016-01-27: qty 50

## 2016-01-27 MED ORDER — DIPHENHYDRAMINE HCL 50 MG/ML IJ SOLN
50.0000 mg | Freq: Once | INTRAMUSCULAR | Status: AC
  Administered 2016-01-27: 50 mg via INTRAVENOUS

## 2016-01-27 MED ORDER — DIPHENHYDRAMINE HCL 50 MG/ML IJ SOLN
INTRAMUSCULAR | Status: AC
  Filled 2016-01-27: qty 1

## 2016-01-27 MED ORDER — FAMOTIDINE IN NACL 20-0.9 MG/50ML-% IV SOLN
20.0000 mg | Freq: Once | INTRAVENOUS | Status: AC
  Administered 2016-01-27: 20 mg via INTRAVENOUS

## 2016-01-27 MED ORDER — SODIUM CHLORIDE 0.9 % IV SOLN
Freq: Once | INTRAVENOUS | Status: AC
  Administered 2016-01-27: 13:00:00 via INTRAVENOUS

## 2016-01-27 MED ORDER — FERUMOXYTOL INJECTION 510 MG/17 ML
510.0000 mg | Freq: Once | INTRAVENOUS | Status: AC
  Administered 2016-01-27: 510 mg via INTRAVENOUS
  Filled 2016-01-27: qty 17

## 2016-01-27 NOTE — Progress Notes (Signed)
SYMPTOM MANAGEMENT CLINIC    Chief Complaint: Fatigue  HPI:  [name] 65 y.o. female diagnosed with iron deficiency anemia.  Currently receives Feraheme infusions on an as-needed basis.    No history exists.    Review of Systems  Constitutional: Positive for malaise/fatigue.  Respiratory: Positive for shortness of breath.   All other systems reviewed and are negative.   Past Medical History:  Diagnosis Date  . Allergic rhinitis   . Anemia, iron deficiency 05/03/2011   recieves iron infusions as well as periodic red blood cell transfusions  . Anxiety   . Colon polyps   . Diabetes mellitus without complication ([place])   . Difficulty sleeping    takes trazadone for sleep  . Diverticulosis   . GERD (gastroesophageal reflux disease)   . GI bleeding    Has had bleeding from small bowel AVMs as well as from colon diverticulosis.  . Hepatitis C    Began Harvoni early 82016. completion date late 02/2015.   Marland Kitchen Hypertension   . Hypokalemic alkalosis   . PCT (porphyria cutanea tarda) ([place])   . Peripheral vascular disease ([place])   . Stroke [facility]) 2010   no residual problems    Past Surgical History:  Procedure Laterality Date  . ABDOMINAL HYSTERECTOMY    . APPLICATION OF WOUND VAC N/A 06/21/2014   Procedure: APPLICATION OF WOUND VAC;  Surgeon: Coralie Keens, MD;  Location: [place];  Service: General;  Laterality: N/A;  . CARPAL TUNNEL RELEASE     rt hand  . ENTEROSCOPY N/A 12/04/2012   Procedure: ENTEROSCOPY;  Surgeon: Beryle Beams, MD;  Location: [facility];  Service: Endoscopy;  Laterality: N/A;  . ENTEROSCOPY N/A 12/18/2012   Procedure: ENTEROSCOPY;  Surgeon: Beryle Beams, MD;  Location: [facility];  Service: Endoscopy;  Laterality: N/A;  . ENTEROSCOPY N/A 02/25/2015   Procedure: ENTEROSCOPY;  Surgeon: Inda Castle, MD;  Location: [facility]COPY;  Service: Endoscopy;  Laterality: N/A;  . ESOPHAGOGASTRODUODENOSCOPY  05/05/2011   Procedure:  ESOPHAGOGASTRODUODENOSCOPY (EGD);  Surgeon: Zenovia Jarred, MD;  Location: [facility];  Service: Gastroenterology;  Laterality: N/A;  Dr. Hilarie Fredrickson will do procedure for Dr. Benson Norway Saturday.  . ESOPHAGOGASTRODUODENOSCOPY  05/08/2011   Procedure: ESOPHAGOGASTRODUODENOSCOPY (EGD);  Surgeon: Beryle Beams;  [facility];  Service: Endoscopy;  Laterality: N/A;  . ESOPHAGOGASTRODUODENOSCOPY  06/07/2011   Procedure: ESOPHAGOGASTRODUODENOSCOPY (EGD);  Surgeon: Beryle Beams, MD;  Location: [facility];  Service: Endoscopy;  Laterality: N/A;  . ESOPHAGOGASTRODUODENOSCOPY  12/20/2011   Procedure: ESOPHAGOGASTRODUODENOSCOPY (EGD);  Surgeon: Beryle Beams, MD;  Location: [facility];  Service: Endoscopy;  Laterality: N/A;  . EYE SURGERY Bilateral    cataracts  . FLEXIBLE SIGMOIDOSCOPY  12/21/2011   Procedure: FLEXIBLE SIGMOIDOSCOPY;  Surgeon: Beryle Beams, MD;  [facility];  Service: Endoscopy;  Laterality: N/A;  . HOT HEMOSTASIS  06/07/2011   Procedure: HOT HEMOSTASIS (ARGON PLASMA COAGULATION/BICAP);  Surgeon: Beryle Beams, MD;  Location: [facility];  Service: Endoscopy;  Laterality: N/A;  . INCISIONAL HERNIA REPAIR N/A 06/01/2014   Procedure: ATTEMPTED LAPAROSCOPIC AND OPEN INCISIONAL HERNIA REPAIR WITH MESH;  Surgeon: Coralie Keens, MD;  Location: [place];  Service: General;  Laterality: N/A;  . INSERTION OF MESH N/A 06/01/2014   Procedure: INSERTION OF MESH;  Surgeon: Coralie Keens, MD;  Location: [place];  Service: General;  Laterality: N/A;  . LAPAROTOMY  02/16/2012   Procedure: EXPLORATORY LAPAROTOMY;  Surgeon: Edward Jolly, MD;  Location: [facility];  Service: General;  Laterality: N/A;  oversewing of anastomotic leak and rigid sigmoidoscopy  . LAPAROTOMY N/A 06/21/2014   Procedure: ABDOMINAL WOUND EXPLORATION;  Surgeon: Coralie Keens, MD;  Location: [place];  Service: General;  Laterality: N/A;  . LEG AMPUTATION ABOVE KNEE    . PARTIAL COLECTOMY  02/15/2012   Procedure:  PARTIAL COLECTOMY;  Surgeon: Harl Bowie, MD;  Location: [facility];  Service: General;  Laterality: N/A;  . TONSILLECTOMY      has Essential hypertension, benign; GERD; CARPAL TUNNEL RELEASE, HX OF; History of hepatitis C; History of hernia repair; Phantom limb pain; Well controlled type 2 diabetes mellitus with peripheral neuropathy ([place]); Depression; History of porphyria; Vitamin D deficiency; Iron deficiency anemia due to chronic blood loss; Arteriovenous malformation of duodenum; Polysubstance abuse; and Hepatic cirrhosis ([place]) on her problem list.    is allergic to ciprofloxacin; morphine and related; penicillins; codeine; lisinopril; and feraheme [ferumoxytol].    Medication List       Accurate as of 01/26/16 11:59 PM. Always use your most recent med list.          aspirin EC 81 MG tablet Take 1 tablet (81 mg total) by mouth daily.   atenolol 50 MG tablet Commonly known as:  TENORMIN TAKE 1 TABLET BY MOUTH EVERY MORNING   cetirizine 10 MG tablet Commonly known as:  ZYRTEC TAKE 1 TABLET BY MOUTH DAILY   FLUoxetine 40 MG capsule Commonly known as:  PROZAC TAKE 1 CAPSULE BY MOUTH DAILY   gabapentin 400 MG capsule Commonly known as:  NEURONTIN TAKE 2 CAPSULES BY MOUTH THREE TIMES A DAY   hydrochlorothiazide 25 MG tablet Commonly known as:  HYDRODIURIL TAKE 1 TABLET BY MOUTH EVERY MORNING   losartan 50 MG tablet Commonly known as:  COZAAR TAKE 1 TABLET BY MOUTH EVERY DAY   omeprazole 20 MG capsule Commonly known as:  PRILOSEC TAKE 1 CAPSULE BY MOUTH EVERY DAY **STOP NEXIUM**   potassium chloride 20 MEQ/15ML (10%) Soln TAKE 15ML BY MOUTH EVERY DAY   traMADol 50 MG tablet Commonly known as:  ULTRAM Take 50 mg by mouth every 6 (six) hours as needed (for pain.).   traZODone 50 MG tablet Commonly known as:  DESYREL TAKE 1 TABLET BY MOUTH EVERY NIGHT AT BEDTIME   vitamin C 500 MG tablet Commonly known as:  ASCORBIC ACID Take 500 mg by mouth every morning.     Vitamin D (Cholecalciferol) 1000 units Tabs Take 1 tablet daily   Zoster Vaccine Live (PF) 19400 UNT/0.65ML injection Commonly known as:  ZOSTAVAX Inject 19,400 Units into the skin once.        PHYSICAL EXAMINATION  Oncology Vitals 01/27/2016 01/27/2016  Height - -  Weight - -  Weight (lbs) - -  BMI (kg/m2) - -  Temp 98.5 98.8  Pulse 75 77  Resp 17 17  Resp (Historical as of 12/27/11) - -  SpO2 99 98  BSA (m2) - -   BP Readings from Last 2 Encounters:  01/27/16 (!) 157/69  01/26/16 (!) 157/87    Physical Exam  Constitutional: She is oriented to person, place, and time and well-developed, well-nourished, and in no distress.  HENT:  Head: Normocephalic and atraumatic.  Eyes: Conjunctivae and EOM are normal. Pupils are equal, round, and reactive to light.  Neck: Normal range of motion.  Pulmonary/Chest: Effort normal. No respiratory distress.  Musculoskeletal: Normal range of motion. She exhibits no edema or tenderness.  Neurological: She is alert and oriented  to person, place, and time.  Patient is an amputee and rides a scooter as baseline.  Skin: Skin is warm and dry.  Psychiatric: Affect normal.  Nursing note and vitals reviewed.   LABORATORY DATA:. Appointment on 01/26/2016  Component Date Value Ref Range Status  . WBC 01/26/2016 8.2  3.9 - 10.3 10e3/uL Final  . NEUT# 01/26/2016 5.3  1.5 - 6.5 10e3/uL Final  . HGB 01/26/2016 10.6* 11.6 - 15.9 g/dL Final  . HCT 01/26/2016 33.5* 34.8 - 46.6 % Final  . Platelets 01/26/2016 338  145 - 400 10e3/uL Final  . MCV 01/26/2016 77.2* 79.5 - 101.0 fL Final  . MCH 01/26/2016 24.5* 25.1 - 34.0 pg Final  . MCHC 01/26/2016 31.7  31.5 - 36.0 g/dL Final  . RBC 01/26/2016 4.35  3.70 - 5.45 10e6/uL Final  . RDW 01/26/2016 25.8* 11.2 - 14.5 % Final  . lymph# 01/26/2016 1.8  0.9 - 3.3 10e3/uL Final  . MONO# 01/26/2016 0.9  0.1 - 0.9 10e3/uL Final  . Eosinophils Absolute 01/26/2016 0.2  0.0 - 0.5 10e3/uL Final  . Basophils Absolute  01/26/2016 0.1  0.0 - 0.1 10e3/uL Final  . NEUT% 01/26/2016 64.2  38.4 - 76.8 % Final  . LYMPH% 01/26/2016 22.3  14.0 - 49.7 % Final  . MONO% 01/26/2016 10.4  0.0 - 14.0 % Final  . EOS% 01/26/2016 2.3  0.0 - 7.0 % Final  . BASO% 01/26/2016 0.8  0.0 - 2.0 % Final  . Ferritin 01/26/2016 13  9 - 269 ng/ml Final  . Iron 01/26/2016 40* 41 - 142 ug/dL Final  . TIBC 01/26/2016 458* 236 - 444 ug/dL Final  . UIBC 01/26/2016 417* 120 - 384 ug/dL Final  . %SAT 01/26/2016 9* 21 - 57 % Final  Scanned Document on 01/25/2016  Component Date Value Ref Range Status  . HM Diabetic Eye Exam 01/13/2016 No Retinopathy  No Retinopathy Final    RADIOGRAPHIC STUDIES: No results found.  ASSESSMENT/PLAN:    Iron deficiency anemia due to chronic blood loss Patient has a history of chronic iron deficiency anemia.  She last received a Feraheme infusion on 12/16/2015.  She presented to the [facility] today with complaint of increased fatigue and mild shortness of breath with exertion only.  She denied any other new symptoms whatsoever.  She denies any recent fevers or chills.  Blood counts obtained today reveal a WBC of 8.2, ANC 5.3, hemoglobin 10.6, platelet count 338.  Iron and ferritin levels are still pending results.  Reviewed all findings with Dr. Burr Medico; and patient will plan to return tomorrow to receive a Feraheme infusion.  Patient is scheduled to return on 02/13/2016 for labs and is tentatively scheduled for her next Feraheme infusion on 02/20/2016.   Patient stated understanding of all instructions; and was in agreement with this plan of care. The patient knows to call the clinic with any problems, questions or concerns.   Total time spent with patient was 25 minutes;  with greater than 75 percent of that time spent in face to face counseling regarding patient's symptoms,  and coordination of care and follow up.  Disclaimer:This dictation was prepared with Dragon/digital dictation along with  [employer]. Any transcriptional errors that result from this process are unintentional.  Drue Second, NP 01/27/2016

## 2016-01-27 NOTE — Patient Instructions (Signed)

## 2016-01-27 NOTE — Progress Notes (Signed)
Pt tolerated infusion well, pt and VS stable at time of discharge.

## 2016-01-27 NOTE — Assessment & Plan Note (Signed)
Patient has a history of chronic iron deficiency anemia.  She last received a Feraheme infusion on 12/16/2015.  She presented to the [facility] today with complaint of increased fatigue and mild shortness of breath with exertion only.  She denied any other new symptoms whatsoever.  She denies any recent fevers or chills.  Blood counts obtained today reveal a WBC of 8.2, ANC 5.3, hemoglobin 10.6, platelet count 338.  Iron and ferritin levels are still pending results.  Reviewed all findings with Dr. Burr Medico; and patient will plan to return tomorrow to receive a Feraheme infusion.  Patient is scheduled to return on 02/13/2016 for labs and is tentatively scheduled for her next Feraheme infusion on 02/20/2016.

## 2016-02-13 ENCOUNTER — Other Ambulatory Visit (HOSPITAL_BASED_OUTPATIENT_CLINIC_OR_DEPARTMENT_OTHER): Payer: Medicare Other

## 2016-02-13 DIAGNOSIS — D500 Iron deficiency anemia secondary to blood loss (chronic): Secondary | ICD-10-CM

## 2016-02-13 DIAGNOSIS — K31819 Angiodysplasia of stomach and duodenum without bleeding: Secondary | ICD-10-CM

## 2016-02-13 LAB — CBC WITH DIFFERENTIAL/PLATELET
BASO%: 0.3 % (ref 0.0–2.0)
Basophils Absolute: 0 10*3/uL (ref 0.0–0.1)
EOS%: 2.7 % (ref 0.0–7.0)
Eosinophils Absolute: 0.2 10*3/uL (ref 0.0–0.5)
HEMATOCRIT: 36.1 % (ref 34.8–46.6)
HEMOGLOBIN: 11.5 g/dL — AB (ref 11.6–15.9)
LYMPH#: 2.2 10*3/uL (ref 0.9–3.3)
LYMPH%: 31.2 % (ref 14.0–49.7)
MCH: 26.5 pg (ref 25.1–34.0)
MCHC: 31.9 g/dL (ref 31.5–36.0)
MCV: 83.2 fL (ref 79.5–101.0)
MONO#: 0.7 10*3/uL (ref 0.1–0.9)
MONO%: 10.1 % (ref 0.0–14.0)
NEUT#: 3.9 10*3/uL (ref 1.5–6.5)
NEUT%: 55.7 % (ref 38.4–76.8)
Platelets: 220 10*3/uL (ref 145–400)
RBC: 4.34 10*6/uL (ref 3.70–5.45)
RDW: 23.8 % — AB (ref 11.2–14.5)
WBC: 7 10*3/uL (ref 3.9–10.3)

## 2016-02-13 LAB — IRON AND TIBC
%SAT: 18 % — ABNORMAL LOW (ref 21–57)
IRON: 66 ug/dL (ref 41–142)
TIBC: 371 ug/dL (ref 236–444)
UIBC: 305 ug/dL (ref 120–384)

## 2016-02-13 LAB — FERRITIN: FERRITIN: 90 ng/mL (ref 9–269)

## 2016-02-17 DIAGNOSIS — G933 Postviral fatigue syndrome: Secondary | ICD-10-CM | POA: Diagnosis not present

## 2016-02-20 ENCOUNTER — Ambulatory Visit (HOSPITAL_BASED_OUTPATIENT_CLINIC_OR_DEPARTMENT_OTHER): Payer: Medicare Other

## 2016-02-20 VITALS — BP 127/68 | HR 80 | Temp 97.2°F | Resp 18

## 2016-02-20 DIAGNOSIS — D500 Iron deficiency anemia secondary to blood loss (chronic): Secondary | ICD-10-CM | POA: Diagnosis not present

## 2016-02-20 MED ORDER — DIPHENHYDRAMINE HCL 50 MG/ML IJ SOLN
INTRAMUSCULAR | Status: AC
  Filled 2016-02-20: qty 1

## 2016-02-20 MED ORDER — FAMOTIDINE IN NACL 20-0.9 MG/50ML-% IV SOLN
INTRAVENOUS | Status: AC
  Filled 2016-02-20: qty 50

## 2016-02-20 MED ORDER — DIPHENHYDRAMINE HCL 50 MG/ML IJ SOLN
50.0000 mg | Freq: Once | INTRAMUSCULAR | Status: AC
  Administered 2016-02-20: 50 mg via INTRAVENOUS

## 2016-02-20 MED ORDER — SODIUM CHLORIDE 0.9 % IV SOLN
510.0000 mg | Freq: Once | INTRAVENOUS | Status: AC
  Administered 2016-02-20: 510 mg via INTRAVENOUS
  Filled 2016-02-20: qty 17

## 2016-02-20 MED ORDER — FAMOTIDINE 20 MG PO TABS: 20.0000 mg | ORAL_TABLET | Freq: Once | ORAL | Status: DC

## 2016-02-20 MED ORDER — FAMOTIDINE IN NACL 20-0.9 MG/50ML-% IV SOLN
20.0000 mg | Freq: Once | INTRAVENOUS | Status: AC
  Administered 2016-02-20: 20 mg via INTRAVENOUS

## 2016-02-20 MED ORDER — SODIUM CHLORIDE 0.9 % IV SOLN
INTRAVENOUS | Status: DC
  Administered 2016-02-20: 15:00:00 via INTRAVENOUS

## 2016-02-20 NOTE — Patient Instructions (Signed)

## 2016-02-24 DIAGNOSIS — E119 Type 2 diabetes mellitus without complications: Secondary | ICD-10-CM | POA: Diagnosis not present

## 2016-02-27 ENCOUNTER — Ambulatory Visit (HOSPITAL_BASED_OUTPATIENT_CLINIC_OR_DEPARTMENT_OTHER): Payer: Medicare Other

## 2016-02-27 VITALS — BP 135/77 | HR 76 | Temp 98.8°F | Resp 18

## 2016-02-27 DIAGNOSIS — D500 Iron deficiency anemia secondary to blood loss (chronic): Secondary | ICD-10-CM | POA: Diagnosis not present

## 2016-02-27 MED ORDER — FAMOTIDINE 20 MG PO TABS
20.0000 mg | ORAL_TABLET | Freq: Once | ORAL | Status: AC
  Administered 2016-02-27: 20 mg via ORAL

## 2016-02-27 MED ORDER — DIPHENHYDRAMINE HCL 50 MG/ML IJ SOLN
50.0000 mg | Freq: Once | INTRAMUSCULAR | Status: AC
  Administered 2016-02-27: 50 mg via INTRAVENOUS

## 2016-02-27 MED ORDER — FERUMOXYTOL INJECTION 510 MG/17 ML
510.0000 mg | Freq: Once | INTRAVENOUS | Status: AC
  Administered 2016-02-27: 510 mg via INTRAVENOUS
  Filled 2016-02-27: qty 17

## 2016-02-27 MED ORDER — FAMOTIDINE 20 MG PO TABS
ORAL_TABLET | ORAL | Status: AC
  Filled 2016-02-27: qty 1

## 2016-02-27 MED ORDER — DIPHENHYDRAMINE HCL 50 MG/ML IJ SOLN
INTRAMUSCULAR | Status: AC
Start: 2016-02-27 — End: 2016-02-27
  Filled 2016-02-27: qty 1

## 2016-02-27 NOTE — Patient Instructions (Signed)

## 2016-02-28 ENCOUNTER — Ambulatory Visit (INDEPENDENT_AMBULATORY_CARE_PROVIDER_SITE_OTHER): Payer: Medicare Other | Admitting: Internal Medicine

## 2016-02-28 DIAGNOSIS — E119 Type 2 diabetes mellitus without complications: Secondary | ICD-10-CM

## 2016-02-28 DIAGNOSIS — I10 Essential (primary) hypertension: Secondary | ICD-10-CM

## 2016-02-28 DIAGNOSIS — M10071 Idiopathic gout, right ankle and foot: Secondary | ICD-10-CM | POA: Diagnosis not present

## 2016-02-28 DIAGNOSIS — Z8719 Personal history of other diseases of the digestive system: Secondary | ICD-10-CM

## 2016-02-28 DIAGNOSIS — M109 Gout, unspecified: Secondary | ICD-10-CM

## 2016-02-28 DIAGNOSIS — Z89512 Acquired absence of left leg below knee: Secondary | ICD-10-CM

## 2016-02-28 MED ORDER — COLCHICINE 0.6 MG PO TABS: 0.6000 mg | ORAL_TABLET | Freq: Two times a day (BID) | ORAL | 0 refills | Status: DC

## 2016-02-28 MED FILL — COLCHICINE 0.6 MG TABLET: 0.6 | 15 days supply | Qty: 30 | Fill #0

## 2016-02-28 NOTE — Progress Notes (Signed)
   CC: right foot pain and swelling  HPI:  PatriciaPatricia [name] is a 65 y.o. with a PMH of HTN, HLD, T2DM, iron deficiency anemia 2/2 GI blood loss due to AVM, depression, CVA, GERD and L BKA with prosthesis.   [name] has had pain and swelling in her R foot for 5 days. It began in her R great toe joint and pain in extending over the dorsum of the foot. She states it feels hot, and endorses subjective fevers and chills. She denies injury to the foot. She does endorse indulging in alcohol more than usual in the last two weeks as well as eating a lot of cheese and red meat.   Please see problem based Assessment and Plan for status of patients chronic conditions.  Past Medical History:  Diagnosis Date  . Allergic rhinitis   . Anemia, iron deficiency 05/03/2011   recieves iron infusions as well as periodic red blood cell transfusions  . Anxiety   . Colon polyps   . Diabetes mellitus without complication ([place])   . Difficulty sleeping    takes trazadone for sleep  . Diverticulosis   . GERD (gastroesophageal reflux disease)   . GI bleeding    Has had bleeding from small bowel AVMs as well as from colon diverticulosis.  . Hepatitis C    Began Harvoni early 82016. completion date late 02/2015.   Marland Kitchen Hypertension   . Hypokalemic alkalosis   . PCT (porphyria cutanea tarda) ([place])   . Peripheral vascular disease ([place])   . Stroke [facility]) 2010   no residual problems    Review of Systems:   Review of Systems  Constitutional: Positive for chills and fever.  Cardiovascular: Positive for leg swelling (in R foot, R leg swelling improved with BP control).  Musculoskeletal: Positive for joint pain (R great toe). Negative for falls.  Skin: Negative for rash.  Neurological: Negative for tingling, sensory change and focal weakness.    Physical Exam:  Vitals:   02/28/16 0826 02/28/16 0932  BP: (!) 187/84 130/90  Pulse: 71 87  Temp: 98.6 F (37 C)   TempSrc: Oral   Weight: 206 lb 8 oz (93.7  kg)    Physical Exam Constitutional: Mild distress, protecting R foot and leg CV: RRR, no murmurs, rubs or gallops Ext: L leg with BKA, prosthesis in place         R leg: exquisitely tender to even mild palpation over R great toe and dorsum of R foot. No erythema that is appreciable. Dependent edema over dorsum of foot and ankle and 1+ pitting edema over tibia. Active range of motion intact but limited due to swelling.   Assessment & Plan:   See Encounters Tab for problem based charting.   Patient seen with Dr. Carmel [place], MD Internal Medicine PGY1

## 2016-02-28 NOTE — Patient Instructions (Addendum)
You have an acute gout flare in your Right toe. We will treat it with colchicine. Day 1 (today): take 2 pills when you get home. Then one hour later than 1 pill. Day 2 and on: take one pill twice a day.   I will see you at your regular appointment next week to see how you're doing!   Gout Gout is an inflammatory arthritis caused by a buildup of uric acid crystals in the joints. Uric acid is a chemical that is normally present in the blood. When the level of uric acid in the blood is too high it can form crystals that deposit in your joints and tissues. This causes joint redness, soreness, and swelling (inflammation). Repeat attacks are common. Over time, uric acid crystals can form into masses (tophi) near a joint, destroying bone and causing disfigurement. Gout is treatable and often preventable. CAUSES  The disease begins with elevated levels of uric acid in the blood. Uric acid is produced by your body when it breaks down a naturally found substance called purines. Certain foods you eat, such as meats and fish, contain high amounts of purines. Causes of an elevated uric acid level include:  Being passed down from parent to child (heredity).  Diseases that cause increased uric acid production (such as obesity, psoriasis, and certain cancers).  Excessive alcohol use.  Diet, especially diets rich in meat and seafood.  Medicines, including certain cancer-fighting medicines (chemotherapy), water pills (diuretics), and aspirin.  Chronic kidney disease. The kidneys are no longer able to remove uric acid well.  Problems with metabolism. Conditions strongly associated with gout include:  Obesity.  High blood pressure.  High cholesterol.  Diabetes. Not everyone with elevated uric acid levels gets gout. It is not understood why some people get gout and others do not. Surgery, joint injury, and eating too much of certain foods are some of the factors that can lead to gout attacks. SYMPTOMS    An attack of gout comes on quickly. It causes intense pain with redness, swelling, and warmth in a joint.  Fever can occur.  Often, only one joint is involved. Certain joints are more commonly involved:  Base of the big toe.  Knee.  Ankle.  Wrist.  Finger. Without treatment, an attack usually goes away in a few days to weeks. Between attacks, you usually will not have symptoms, which is different from many other forms of arthritis. DIAGNOSIS  Your caregiver will suspect gout based on your symptoms and exam. In some cases, tests may be recommended. The tests may include:  Blood tests.  Urine tests.  X-rays.  Joint fluid exam. This exam requires a needle to remove fluid from the joint (arthrocentesis). Using a microscope, gout is confirmed when uric acid crystals are seen in the joint fluid. TREATMENT  There are two phases to gout treatment: treating the sudden onset (acute) attack and preventing attacks (prophylaxis).  Treatment of an Acute Attack.  Medicines are used. These include anti-inflammatory medicines or steroid medicines.  An injection of steroid medicine into the affected joint is sometimes necessary.  The painful joint is rested. Movement can worsen the arthritis.  You may use warm or cold treatments on painful joints, depending which works best for you.  Treatment to Prevent Attacks.  If you suffer from frequent gout attacks, your caregiver may advise preventive medicine. These medicines are started after the acute attack subsides. These medicines either help your kidneys eliminate uric acid from your body or decrease your uric  acid production. You may need to stay on these medicines for a very long time.  The early phase of treatment with preventive medicine can be associated with an increase in acute gout attacks. For this reason, during the first few months of treatment, your caregiver may also advise you to take medicines usually used for acute gout  treatment. Be sure you understand your caregiver's directions. Your caregiver may make several adjustments to your medicine dose before these medicines are effective.  Discuss dietary treatment with your caregiver or dietitian. Alcohol and drinks high in sugar and fructose and foods such as meat, poultry, and seafood can increase uric acid levels. Your caregiver or dietitian can advise you on drinks and foods that should be limited. HOME CARE INSTRUCTIONS   Do not take aspirin to relieve pain. This raises uric acid levels.  Only take over-the-counter or prescription medicines for pain, discomfort, or fever as directed by your caregiver.  Rest the joint as much as possible. When in bed, keep sheets and blankets off painful areas.  Keep the affected joint raised (elevated).  Apply warm or cold treatments to painful joints. Use of warm or cold treatments depends on which works best for you.  Use crutches if the painful joint is in your leg.  Drink enough fluids to keep your urine clear or pale yellow. This helps your body get rid of uric acid. Limit alcohol, sugary drinks, and fructose drinks.  Follow your dietary instructions. Pay careful attention to the amount of protein you eat. Your daily diet should emphasize fruits, vegetables, whole grains, and fat-free or low-fat milk products. Discuss the use of coffee, vitamin C, and cherries with your caregiver or dietitian. These may be helpful in lowering uric acid levels.  Maintain a healthy body weight. SEEK MEDICAL CARE IF:   You develop diarrhea, vomiting, or any side effects from medicines.  You do not feel better in 24 hours, or you are getting worse. SEEK IMMEDIATE MEDICAL CARE IF:   Your joint becomes suddenly more tender, and you have chills or a fever. MAKE SURE YOU:   Understand these instructions.  Will watch your condition.  Will get help right away if you are not doing well or get worse.   This information is not  intended to replace advice given to you by your health care provider. Make sure you discuss any questions you have with your health care provider.   Document Released: 05/11/2000 Document Revised: 06/04/2014 Document Reviewed: 12/26/2011 Elsevier Interactive Patient Education [employer].

## 2016-02-29 ENCOUNTER — Other Ambulatory Visit: Payer: Self-pay | Admitting: Internal Medicine

## 2016-02-29 ENCOUNTER — Encounter: Payer: Self-pay | Admitting: Internal Medicine

## 2016-02-29 DIAGNOSIS — E559 Vitamin D deficiency, unspecified: Secondary | ICD-10-CM

## 2016-02-29 DIAGNOSIS — E1142 Type 2 diabetes mellitus with diabetic polyneuropathy: Secondary | ICD-10-CM

## 2016-02-29 DIAGNOSIS — I10 Essential (primary) hypertension: Secondary | ICD-10-CM

## 2016-02-29 NOTE — Progress Notes (Signed)
Internal Medicine Clinic Attending  I saw and evaluated the patient.  I personally confirmed the key portions of the history and exam documented by Dr. Svalina and I reviewed pertinent patient test results.  The assessment, diagnosis, and plan were formulated together and I agree with the documentation in the resident's note.  

## 2016-02-29 NOTE — Assessment & Plan Note (Signed)
Patient with signs and symptoms of gout in R great toe. This would be her first occurrence. She is on HCTZ for hypertension as well as losartan. HCTZ is a risk factor while losartan is protective. She endorses indulging in more than her usual amount of alcohol in the last two weeks as well as eating a lot of cheese. Patient has history of chronic GI bleeding 2/2 avm's and receives Fe and blood transfusions regularly. She also has DM that is well controled with diet. With that in mind, I think the best therapy for her is colchicine at this time.   Plan:  --Colchicine 1.2mg  now, then 0.6mg  in one hour; continue 0.6mg  twice a day until symptoms resolve.  --No need for prophylactic medication at this time as this is first occurrence.  --will f/u at regularly scheduled appt oct 12th. --educated on dietary changes to decrease risk of gout as well as provided Neurosurgeon.

## 2016-03-07 ENCOUNTER — Telehealth: Payer: Self-pay | Admitting: Internal Medicine

## 2016-03-07 NOTE — Telephone Encounter (Signed)
APT. REMINDER CALL, NO ANSWER, NO VOICEMAIL °

## 2016-03-08 ENCOUNTER — Ambulatory Visit (INDEPENDENT_AMBULATORY_CARE_PROVIDER_SITE_OTHER): Payer: Medicare Other | Admitting: Internal Medicine

## 2016-03-08 ENCOUNTER — Encounter: Payer: Self-pay | Admitting: Internal Medicine

## 2016-03-08 VITALS — BP 150/81 | HR 67 | Ht 63.0 in | Wt 206.0 lb

## 2016-03-08 DIAGNOSIS — Z79899 Other long term (current) drug therapy: Secondary | ICD-10-CM

## 2016-03-08 DIAGNOSIS — I10 Essential (primary) hypertension: Secondary | ICD-10-CM

## 2016-03-08 DIAGNOSIS — K7460 Unspecified cirrhosis of liver: Secondary | ICD-10-CM | POA: Diagnosis not present

## 2016-03-08 DIAGNOSIS — Z23 Encounter for immunization: Secondary | ICD-10-CM

## 2016-03-08 DIAGNOSIS — F17210 Nicotine dependence, cigarettes, uncomplicated: Secondary | ICD-10-CM

## 2016-03-08 DIAGNOSIS — E1142 Type 2 diabetes mellitus with diabetic polyneuropathy: Secondary | ICD-10-CM

## 2016-03-08 DIAGNOSIS — M10071 Idiopathic gout, right ankle and foot: Secondary | ICD-10-CM | POA: Diagnosis not present

## 2016-03-08 DIAGNOSIS — M109 Gout, unspecified: Secondary | ICD-10-CM

## 2016-03-08 LAB — POCT GLYCOSYLATED HEMOGLOBIN (HGB A1C): HEMOGLOBIN A1C: 5.5

## 2016-03-08 LAB — GLUCOSE, CAPILLARY: Glucose-Capillary: 184 mg/dL — ABNORMAL HIGH (ref 65–99)

## 2016-03-08 MED ORDER — COLCHICINE 0.6 MG PO TABS: 0.6000 mg | ORAL_TABLET | Freq: Two times a day (BID) | ORAL | 0 refills | Status: DC

## 2016-03-08 NOTE — Patient Instructions (Addendum)
I refilled the colchicine in case you need it for more than the initial pills you got last week. Keep taking it until your symptoms resolve, then you can stop.   Remember to limit alcohol, red meat (including pork) and cheeses. These can give you a gout attack. If your symptoms return, please come back to the clinic.  Your A1c was 5.5 today, You're doing great!  I will place an order for an ultrasound of your liver.

## 2016-03-08 NOTE — Progress Notes (Signed)
   CC: diabetes and gout  HPI:  [name] is a 65 y.o. with a PMH of iron deficiency anemia 2/2 GI AVM's, T2DM controlled with diet, h/o of Hep C (treated successfully with Harvoni), cirrhosis, and HTN; she was diagnosed with 1st episode of gout of right great toe on 10/3 and treated with cholchicine; she is following up on the gout and diabetes management.  Please see problem based Assessment and Plan for status of patients chronic conditions.  Past Medical History:  Diagnosis Date  . Allergic rhinitis   . Anemia, iron deficiency 05/03/2011   recieves iron infusions as well as periodic red blood cell transfusions  . Anxiety   . Colon polyps   . Diabetes mellitus without complication ([place])   . Difficulty sleeping    takes trazadone for sleep  . Diverticulosis   . GERD (gastroesophageal reflux disease)   . GI bleeding    Has had bleeding from small bowel AVMs as well as from colon diverticulosis.  . Hepatitis C    Began Harvoni early 82016. completion date late 02/2015.   Marland Kitchen Hypertension   . Hypokalemic alkalosis   . PCT (porphyria cutanea tarda) ([place])   . Peripheral vascular disease ([place])   . Stroke [facility]) 2010   no residual problems    Review of Systems:   Review of Systems  Constitutional: Negative for chills and fever.  Cardiovascular: Negative for chest pain, orthopnea and leg swelling.  Gastrointestinal: Negative for abdominal pain.  Neurological: Negative for tingling and sensory change.    Physical Exam:  Vitals:   03/08/16 1402 03/08/16 1451  BP: (!) 159/79 (!) 150/81  Pulse: 73 67  Weight: 206 lb (93.4 kg)   Height: 5\' 3"  (1.6 m)    Physical Exam  Constitutional: NAD, pleasant CV: RRR, no murmurs, rubs or gallops appreciated, no appreciable pitting edema in RLE Resp: CTAB, no increased work of breathing Abd: soft, obese, +BS, NDNT, no hepatomegaly MSK: L AKA, RLE with minimal dorsal swelling with mild tenderness to palpation over dorsum of foot  and great toe; Full ROM  Assessment & Plan:   See Encounters Tab for problem based charting.   Patient seen with Dr. Abelardo Diesel, MD Internal Medicine PGY1

## 2016-03-09 LAB — LIPID PANEL
CHOLESTEROL TOTAL: 163 mg/dL (ref 100–199)
Chol/HDL Ratio: 4.3 ratio units (ref 0.0–4.4)
HDL: 38 mg/dL — AB (ref >39)
LDL Calculated: 87 mg/dL (ref 0–99)
Triglycerides: 188 mg/dL — ABNORMAL HIGH (ref 0–149)
VLDL CHOLESTEROL CAL: 38 mg/dL (ref 5–40)

## 2016-03-09 NOTE — Assessment & Plan Note (Signed)
>>  ASSESSMENT AND PLAN FOR TYPE 2 DIABETES MELLITUS WITH DIABETIC NEUROPATHY (HCC) WRITTEN ON 03/09/2016  9:19 PM BY LORETA LEVELS, MD  Patient with T2DM controlled with diet; Last A1c 6 in Feb 2017. Patient denies symptoms of hyperglycemia.  Plan: --check A1c - 5.5, continue lifestyle management of DM --ASCVD risk >7.5 - will plan on starting high intensity statin after completion of colchicine  therapy due to inc risk of myopahty, atorvastatin 40mg  daily

## 2016-03-09 NOTE — Assessment & Plan Note (Addendum)
Patient with resolving gout flare in right great toe with colchicine therapy. She is able to bear weight on the R leg, swelling and pain have greatly improved, and she is able to freely move her toes and ankle without pain or restriction due to swelling. There is still some swelling and tenderness to palpation, no increased erythema.  Plan: --continue colchicine until flare resolves; no need to prophylactic therapy as this is first flare --educated patient again on dietary changes that decrease the risk of further gout attacks

## 2016-03-09 NOTE — Assessment & Plan Note (Signed)
Patient on atenolol 50mg  daily, HCTZ 25mg  daily, amlodipine 10mg  daily, and losartan 50mg  daily. She reports checking her BP at home but does not keep a log. Her BP's in office are usually elevated initially and are <140/90 on recheck, but today remains high.   Plan: --continue current management --asked patient to record BP readings and bring to next appointment so I can see how BP runs long-term before adjusting therapy.

## 2016-03-09 NOTE — Assessment & Plan Note (Signed)
Patient with cirrhosis, last [place] screening U/S in May 2017 shows no focal hepatic abnormality.  Plan: --order screening U/S for patient to complete in Nov --plan for q61month screens

## 2016-03-09 NOTE — Assessment & Plan Note (Signed)
Patient with T2DM controlled with diet; Last A1c 6 in Feb 2017. Patient denies symptoms of hyperglycemia.  Plan: --check A1c - 5.5, continue lifestyle management of DM --ASCVD risk >7.5 - will plan on starting high intensity statin after completion of colchicine therapy due to inc risk of myopahty, atorvastatin 40mg  daily

## 2016-03-11 NOTE — Progress Notes (Signed)
Internal Medicine Clinic Attending  I saw and evaluated the patient.  I personally confirmed the key portions of the history and exam documented by Dr. Svalina and I reviewed pertinent patient test results.  The assessment, diagnosis, and plan were formulated together and I agree with the documentation in the resident's note.  

## 2016-03-12 ENCOUNTER — Other Ambulatory Visit (HOSPITAL_BASED_OUTPATIENT_CLINIC_OR_DEPARTMENT_OTHER): Payer: Medicare Other

## 2016-03-12 DIAGNOSIS — D500 Iron deficiency anemia secondary to blood loss (chronic): Secondary | ICD-10-CM

## 2016-03-12 DIAGNOSIS — K31819 Angiodysplasia of stomach and duodenum without bleeding: Secondary | ICD-10-CM

## 2016-03-12 LAB — FERRITIN: Ferritin: 291 ng/ml — ABNORMAL HIGH (ref 9–269)

## 2016-03-12 LAB — CBC WITH DIFFERENTIAL/PLATELET
BASO%: 0.8 % (ref 0.0–2.0)
BASOS ABS: 0.1 10*3/uL (ref 0.0–0.1)
EOS ABS: 0.2 10*3/uL (ref 0.0–0.5)
EOS%: 2.7 % (ref 0.0–7.0)
HEMATOCRIT: 42.4 % (ref 34.8–46.6)
HEMOGLOBIN: 13.8 g/dL (ref 11.6–15.9)
LYMPH#: 2.2 10*3/uL (ref 0.9–3.3)
LYMPH%: 33.8 % (ref 14.0–49.7)
MCH: 28.6 pg (ref 25.1–34.0)
MCHC: 32.5 g/dL (ref 31.5–36.0)
MCV: 87.8 fL (ref 79.5–101.0)
MONO#: 1.1 10*3/uL — AB (ref 0.1–0.9)
MONO%: 16.3 % — ABNORMAL HIGH (ref 0.0–14.0)
NEUT#: 3 10*3/uL (ref 1.5–6.5)
NEUT%: 46.4 % (ref 38.4–76.8)
PLATELETS: 240 10*3/uL (ref 145–400)
RBC: 4.84 10*6/uL (ref 3.70–5.45)
RDW: 22 % — AB (ref 11.2–14.5)
WBC: 6.6 10*3/uL (ref 3.9–10.3)

## 2016-03-12 MED FILL — COLCHICINE 0.6 MG TABLET: 0.6 | 15 days supply | Qty: 30 | Fill #0

## 2016-03-14 DIAGNOSIS — G8929 Other chronic pain: Secondary | ICD-10-CM | POA: Diagnosis not present

## 2016-03-14 DIAGNOSIS — R1084 Generalized abdominal pain: Secondary | ICD-10-CM | POA: Diagnosis not present

## 2016-03-15 DIAGNOSIS — G933 Postviral fatigue syndrome: Secondary | ICD-10-CM | POA: Diagnosis not present

## 2016-03-23 ENCOUNTER — Ambulatory Visit (HOSPITAL_COMMUNITY)
Admission: RE | Admit: 2016-03-23 | Discharge: 2016-03-23 | Disposition: A | Discharge status: Home / Self Care | Payer: Medicare Other | Source: Ambulatory Visit | Attending: Internal Medicine | Admitting: Internal Medicine

## 2016-03-23 DIAGNOSIS — K7460 Unspecified cirrhosis of liver: Secondary | ICD-10-CM | POA: Diagnosis not present

## 2016-03-23 DIAGNOSIS — R109 Unspecified abdominal pain: Secondary | ICD-10-CM | POA: Diagnosis not present

## 2016-03-28 ENCOUNTER — Other Ambulatory Visit: Payer: Self-pay | Admitting: Internal Medicine

## 2016-04-06 ENCOUNTER — Other Ambulatory Visit: Payer: Self-pay

## 2016-04-06 ENCOUNTER — Other Ambulatory Visit: Payer: Self-pay | Admitting: Internal Medicine

## 2016-04-06 DIAGNOSIS — M109 Gout, unspecified: Secondary | ICD-10-CM

## 2016-04-06 NOTE — Telephone Encounter (Signed)
colchicine 0.6 MG tablet, refill request @ [facility].

## 2016-04-06 NOTE — Telephone Encounter (Signed)
Called patient regarding refill request for colchicine. Patient states she does not think she is having gout attack, but that the weather is affecting her foot. She states that the gout attack has resolved and she just has infrequent foot discomfort and will take a colchicine pill when this happens because she does not want a recurrence of gout.   Spoke to patient about appropriate use of colchicine and that it is not a PRN medicine. If she thinks she is having recurrence of gout, she was advised to be evaluated in the clinic. She states that she understands and does not think that right now she needs to be evaluated.   [name], MD IMTS - PGY1 Pager [phone]

## 2016-04-08 ENCOUNTER — Other Ambulatory Visit: Payer: Self-pay | Admitting: Hematology

## 2016-04-09 ENCOUNTER — Ambulatory Visit (HOSPITAL_BASED_OUTPATIENT_CLINIC_OR_DEPARTMENT_OTHER): Payer: Medicare Other | Admitting: Hematology

## 2016-04-09 ENCOUNTER — Encounter: Payer: Self-pay | Admitting: Hematology

## 2016-04-09 ENCOUNTER — Telehealth: Payer: Self-pay | Admitting: Hematology

## 2016-04-09 ENCOUNTER — Other Ambulatory Visit (HOSPITAL_BASED_OUTPATIENT_CLINIC_OR_DEPARTMENT_OTHER): Payer: Medicare Other

## 2016-04-09 VITALS — BP 168/88 | HR 88 | Temp 98.0°F | Resp 17 | Ht 63.0 in | Wt 167.7 lb

## 2016-04-09 DIAGNOSIS — D500 Iron deficiency anemia secondary to blood loss (chronic): Secondary | ICD-10-CM

## 2016-04-09 DIAGNOSIS — M2550 Pain in unspecified joint: Secondary | ICD-10-CM

## 2016-04-09 DIAGNOSIS — K31819 Angiodysplasia of stomach and duodenum without bleeding: Secondary | ICD-10-CM

## 2016-04-09 DIAGNOSIS — Z8639 Personal history of other endocrine, nutritional and metabolic disease: Secondary | ICD-10-CM

## 2016-04-09 DIAGNOSIS — B1920 Unspecified viral hepatitis C without hepatic coma: Secondary | ICD-10-CM | POA: Diagnosis not present

## 2016-04-09 DIAGNOSIS — Z720 Tobacco use: Secondary | ICD-10-CM

## 2016-04-09 DIAGNOSIS — K922 Gastrointestinal hemorrhage, unspecified: Secondary | ICD-10-CM

## 2016-04-09 DIAGNOSIS — E119 Type 2 diabetes mellitus without complications: Secondary | ICD-10-CM

## 2016-04-09 DIAGNOSIS — K7460 Unspecified cirrhosis of liver: Secondary | ICD-10-CM

## 2016-04-09 LAB — CBC WITH DIFFERENTIAL/PLATELET
BASO%: 0.3 % (ref 0.0–2.0)
Basophils Absolute: 0 10*3/uL (ref 0.0–0.1)
EOS ABS: 0.1 10*3/uL (ref 0.0–0.5)
EOS%: 2 % (ref 0.0–7.0)
HEMATOCRIT: 40.9 % (ref 34.8–46.6)
HGB: 13.7 g/dL (ref 11.6–15.9)
LYMPH#: 1.9 10*3/uL (ref 0.9–3.3)
LYMPH%: 27.1 % (ref 14.0–49.7)
MCH: 29.5 pg (ref 25.1–34.0)
MCHC: 33.5 g/dL (ref 31.5–36.0)
MCV: 88 fL (ref 79.5–101.0)
MONO#: 0.8 10*3/uL (ref 0.1–0.9)
MONO%: 11.6 % (ref 0.0–14.0)
NEUT#: 4.1 10*3/uL (ref 1.5–6.5)
NEUT%: 59 % (ref 38.4–76.8)
PLATELETS: 198 10*3/uL (ref 145–400)
RBC: 4.65 10*6/uL (ref 3.70–5.45)
RDW: 16.5 % — ABNORMAL HIGH (ref 11.2–14.5)
WBC: 7 10*3/uL (ref 3.9–10.3)

## 2016-04-09 NOTE — Progress Notes (Signed)
[name]   Patricia Grieve, MD [name] [id number]  DIAGNOSIS: Iron deficiency anemia due to chronic blood loss  History of porphyria  Gastrointestinal hemorrhage, unspecified gastrointestinal hemorrhage type  Cirrhosis of liver without ascites, unspecified hepatic cirrhosis type ([place]).   PROBLEM LIST:  1. Recurrent iron-deficiency anemia secondary to gastrointestinal bleeding (as noted above). [name] also has required red cell transfusions in the past, most recently early December 2012. She underwent a small bowel enteroscopy by Dr. Dan Humphreys at [facility] on 08/13/2011. The enteroscope was passed up to 6 feet into the jejunum. He saw multiple AVMs, which were ablated. Two of the large AVMs bled during APC. These were in the 4th part of the duodenum very close to the ligament of Treitz. The remainder of the AVMs were in the proximal jejunum. In addition to the small bowel AV malformations, which were treated with APC, he saw some small esophageal varices which were not bleeding and also evidence of mild portal hypertensive gastropathy.  2. History of duodenal arteriovenous malformation.  3. History of hepatitis C infection.  4. Gastroesophageal reflux disease.  5. Depression.  6. Hypertension.  7. History of right Bell's palsy.  8. Status post left above-knee amputation secondary to necrotizing fasciitis 03/06/2009.  9. Neurodermatitis.  10. Previous history of alcohol usage.  11. Admission to the hospital from 12/20/2011 through 12/25/2011 for hematochezia felt to be secondary to a diverticular bleed, requiring 2 units of packed red cells.  12. Diverticulosis.  13. Elevated alpha fetoprotein, 72.3 on 11/19/2011 and 76.0 on 01/11/2012.  14. Abnormal MRI of the liver from 11/28/2011 showing diffuse and markedly low signal intensity throughout the liver as well as diffuse low  signal intensity in the adrenal glands. 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 There were no findings for cirrhosis, portal hypertension, splenomegaly or ascites, not were there any worrisome enhancing liver lesions.  15. Systolic ejection murmur.   CHIEF COMPLAIN: Follow up anemia  PRIOR TREATMENT: The patient has been heavily dependent upon IV Feraheme. Over the past year or so she has been receiving about 1 Feraheme infusion monthly on average. The patient's most recent infusions of IV Feraheme 510 mg occurred on the following dates: 06/05/2011, 07/03/2011, 07/31/2011, 08/17/2011, 08/28/2011. The patient received IV Feraheme 1020 mg on 10/02/2011 01/30/2012, 06/06/2012 and 09/10/2012, 05/2013, 07/2013, 08/2013, 09/2013, 11/12/2013, 01/15/2014 and 03/16/2014, 09/16/2014 and 09/23/2014, 11/2014 , 02/24/2015,  04/07/2015, 08/03/2015, 10/03/2015, 12/16/2015, 01/2016 and 02/27/2016  CURRENT TREATMENT: As noted above. Feraheme prn, will be cautious for iron supplement due to PCT  INTERVAL HISTORY:  [name] 65 y.o. female with history of recurrent iron-deficiency anemia due to GI bleeding is here for follow-up. GI bleeding is probably due to AV malformations and diverticulosis. She came to the clinic in a wheelchair by herself today   She had gout flare in her right foot a months ago, was treated with colchicine by her primary care physician. It has resolved now. She tripped and fell down on her left side buttock yesterday, no significant bruise, mild pain, she is able to walk with a walker. She complains of being fatigued lately, and some joint stiffness, she is wondering if her I'll level is low again.  MEDICAL HISTORY: Past Medical History:  Diagnosis Date  . Allergic rhinitis   . Anemia, iron deficiency 05/03/2011   recieves iron infusions as well as periodic  red blood cell transfusions  . Anxiety   . Colon polyps   . Diabetes mellitus without complication  ([place])   . Difficulty sleeping    takes trazadone for sleep  . Diverticulosis   . GERD (gastroesophageal reflux disease)   . GI bleeding    Has had bleeding from small bowel AVMs as well as from colon diverticulosis.  . Hepatitis C    Began Harvoni early 82016. completion date late 02/2015.   Marland Kitchen Hypertension   . Hypokalemic alkalosis   . PCT (porphyria cutanea tarda) ([place])   . Peripheral vascular disease ([place])   . Stroke [facility]) 2010   no residual problems     ALLERGIES:  is allergic to ciprofloxacin; morphine and related; penicillins; codeine; lisinopril; and feraheme [ferumoxytol].  MEDICATIONS: has a current medication list which includes the following prescription(s): aspirin ec, atenolol, cetirizine, cholecalciferol, colchicine, fluoxetine, gabapentin, hydrochlorothiazide, losartan, omeprazole, potassium chloride, tramadol, trazodone, and vitamin c.  SURGICAL HISTORY:  Past Surgical History:  Procedure Laterality Date  . ABDOMINAL HYSTERECTOMY    . APPLICATION OF WOUND VAC N/A 06/21/2014   Procedure: APPLICATION OF WOUND VAC;  Surgeon: Coralie Keens, MD;  Location: [facility];  Service: General;  Laterality: N/A;  . CARPAL TUNNEL RELEASE     rt hand  . ENTEROSCOPY N/A 12/04/2012   Procedure: ENTEROSCOPY;  Surgeon: Beryle Beams, MD;  Location: [facility];  Service: Endoscopy;  Laterality: N/A;  . ENTEROSCOPY N/A 12/18/2012   Procedure: ENTEROSCOPY;  Surgeon: Beryle Beams, MD;  Location: [facility];  Service: Endoscopy;  Laterality: N/A;  . ENTEROSCOPY N/A 02/25/2015   Procedure: ENTEROSCOPY;  Surgeon: Inda Castle, MD;  Location[facility]COPY;  Service: Endoscopy;  Laterality: N/A;  . ESOPHAGOGASTRODUODENOSCOPY  05/05/2011   Procedure: ESOPHAGOGASTRODUODENOSCOPY (EGD);  Surgeon: Zenovia Jarred, MD;  Location: [facility];  Service: Gastroenterology;  Laterality: N/A;  Dr. Hilarie Fredrickson will do procedure for Dr. Benson Norway Saturday.  . ESOPHAGOGASTRODUODENOSCOPY  05/08/2011   Procedure:  ESOPHAGOGASTRODUODENOSCOPY (EGD);  Surgeon: Beryle Beams;  [facility];  Service: Endoscopy;  Laterality: N/A;  . ESOPHAGOGASTRODUODENOSCOPY  06/07/2011   Procedure: ESOPHAGOGASTRODUODENOSCOPY (EGD);  Surgeon: Beryle Beams, MD;  Location: [facility];  Service: Endoscopy;  Laterality: N/A;  . ESOPHAGOGASTRODUODENOSCOPY  12/20/2011   Procedure: ESOPHAGOGASTRODUODENOSCOPY (EGD);  Surgeon: Beryle Beams, MD;  Location: [facility];  Service: Endoscopy;  Laterality: N/A;  . EYE SURGERY Bilateral    cataracts  . FLEXIBLE SIGMOIDOSCOPY  12/21/2011   Procedure: FLEXIBLE SIGMOIDOSCOPY;  Surgeon: Beryle Beams, MD;  [facility];  Service: Endoscopy;  Laterality: N/A;  . HOT HEMOSTASIS  06/07/2011   Procedure: HOT HEMOSTASIS (ARGON PLASMA COAGULATION/BICAP);  Surgeon: Beryle Beams, MD;  Location: [facility];  Service: Endoscopy;  Laterality: N/A;  . INCISIONAL HERNIA REPAIR N/A 06/01/2014   Procedure: ATTEMPTED LAPAROSCOPIC AND OPEN INCISIONAL HERNIA REPAIR WITH MESH;  Surgeon: Coralie Keens, MD;  Location: [facility];  Service: General;  Laterality: N/A;  . INSERTION OF MESH N/A 06/01/2014   Procedure: INSERTION OF MESH;  Surgeon: Coralie Keens, MD;  Location: [facility];  Service: General;  Laterality: N/A;  . LAPAROTOMY  02/16/2012   Procedure: EXPLORATORY LAPAROTOMY;  Surgeon: Edward Jolly, MD;  Location: [facility];  Service: General;  Laterality: N/A;  oversewing of anastomotic leak and rigid sigmoidoscopy  . LAPAROTOMY N/A 06/21/2014   Procedure: ABDOMINAL WOUND EXPLORATION;  Surgeon: Coralie Keens, MD;  Location: [facility];  Service: General;  Laterality: N/A;  . LEG  AMPUTATION ABOVE KNEE    . PARTIAL COLECTOMY  02/15/2012   Procedure: PARTIAL COLECTOMY;  Surgeon: Harl Bowie, MD;  [facility];  Service: General;  Laterality: N/A;  . TONSILLECTOMY       Medication List       Accurate as of 04/09/16  3:59 PM. Always use your most recent med list.           aspirin EC 81 MG tablet Take 1 tablet (81 mg total) by mouth daily.   atenolol 50 MG tablet Commonly known as:  TENORMIN TAKE 1 TABLET BY MOUTH EVERY MORNING   cetirizine 10 MG tablet Commonly known as:  ZYRTEC TAKE 1 TABLET BY MOUTH DAILY   cholecalciferol 1000 units tablet Commonly known as:  VITAMIN D TAKE ONE TABLET BY MOUTH DAILY.   colchicine 0.6 MG tablet Take 1 tablet (0.6 mg total) by mouth 2 (two) times daily.   FLUoxetine 40 MG capsule Commonly known as:  PROZAC TAKE 1 CAPSULE BY MOUTH DAILY   gabapentin 400 MG capsule Commonly known as:  NEURONTIN TAKE 2 CAPSULES BY MOUTH THREE TIMES A DAY   hydrochlorothiazide 25 MG tablet Commonly known as:  HYDRODIURIL TAKE 1 TABLET BY MOUTH EVERY MORNING   losartan 50 MG tablet Commonly known as:  COZAAR TAKE 1 TABLET BY MOUTH EVERY DAY   omeprazole 20 MG capsule Commonly known as:  PRILOSEC TAKE 1 CAPSULE BY MOUTH EVERY DAY **STOP NEXIUM**   potassium chloride 20 MEQ/15ML (10%) Soln TAKE 15ML BY MOUTH EVERY DAY   traMADol 50 MG tablet Commonly known as:  ULTRAM Take 50 mg by mouth every 6 (six) hours as needed (for pain.).   traZODone 50 MG tablet Commonly known as:  DESYREL TAKE 1 TABLET BY MOUTH EVERY NIGHT AT BEDTIME   vitamin C 500 MG tablet Commonly known as:  ASCORBIC ACID Take 500 mg by mouth every morning.        REVIEW OF SYSTEMS:   Constitutional: Denies fevers, chills or abnormal weight loss; Reports mild fatigue.  Eyes: Denies blurriness of vision Ears, nose, mouth, throat, and face: Denies mucositis or sore throat Respiratory: Denies cough, dyspnea or wheezes Cardiovascular: Denies palpitation, chest discomfort or lower extremity swelling Gastrointestinal:  Denies nausea, heartburn or change in bowel habits, hernia is painful and bleeds sometime the overlying skin. Skin: Denies abnormal skin rashes Lymphatics: Denies new lymphadenopathy or easy bruising Neurological:Denies numbness,  tingling or new weaknesses Behavioral/Psych: Mood is stable, no new changes  All other systems were reviewed with the patient and are negative.  PHYSICAL EXAMINATION: ECOG PERFORMANCE STATUS: 2 BP (!) 168/88 (BP Location: Left Arm, Patient Position: Sitting)   Pulse 88   Temp 98 F (36.7 C) (Oral)   Resp 17   Ht 5\' 3"  (1.6 m)   Wt 207 lb 11.2 oz (94.2 kg)   SpO2 99%   BMI 36.79 kg/m  General appearance: alert, appears stated age, no distress and moderately obese  Skin: she has multiple skin pigmentations from previous rash on her arms and legs. She is a 1cm size shadow skin ulcer in the right lower extremity below knee, dry, no discharge, no surrounding skin erythema, and nontender. Head: Normocephalic, without obvious abnormality, atraumatic  Neck: no adenopathy, supple, symmetrical, trachea midline and thyroid not enlarged, symmetric, no tenderness/mass/nodules  Lymph nodes: Cervical adenopathy: None appreciated  Heart:regular rate and rhythm, S1, S2 normal and systolic murmur: systolic ejection 2/6, harsh and previously documented without radiation  Lung:chest  clear, no wheezing, rales, normal symmetric air entry, Heart exam - S1, S2 normal, no murmur, no gallop, rate regular  Abdomin: soft, distended, normal bowel sounds, nontender, (+) abdominal surgical wound has small discharge.  EXT:No peripheral edema on right extremity; Left above the knee amputation. She is wearing a prothesis.   Labs:  CBC Latest Ref Rng & Units 04/09/2016 03/12/2016 02/13/2016  WBC 3.9 - 10.3 10e3/uL 7.0 6.6 7.0  Hemoglobin 11.6 - 15.9 g/dL 13.7 13.8 11.5(L)  Hematocrit 34.8 - 46.6 % 40.9 42.4 36.1  Platelets 145 - 400 10e3/uL 198 240 220    CMP Latest Ref Rng & Units 01/16/2016 12/26/2015 12/05/2015  Glucose 70 - 140 mg/dl 122 118 196(H)  BUN 7.0 - 26.0 mg/dL 18.4 20.0 12.7  Creatinine 0.6 - 1.1 mg/dL 1.0 1.0 1.0  Sodium 136 - 145 mEq/L 139 140 138  Potassium 3.5 - 5.1 mEq/L 3.9 4.6 3.9  Chloride 101  - 111 mmol/L - - -  CO2 22 - 29 mEq/L 25 23 24   Calcium 8.4 - 10.4 mg/dL 9.2 9.4 8.9  Total Protein 6.4 - 8.3 g/dL 7.1 7.7 7.5  Total Bilirubin 0.20 - 1.20 mg/dL <0.30 <0.30 <0.30  Alkaline Phos 40 - 150 U/L 82 93 92  AST 5 - 34 U/L 19 26 18   ALT 0 - 55 U/L 14 18 12    Results for MORGYN, MELUCCI (MRN [id number]) as of 04/09/2016 08:12  Ref. Range 01/26/2016 14:43 02/13/2016 14:35 03/12/2016 14:47  Iron Latest Ref Range: 41 - 142 ug/dL 40 (L) 66   UIBC Latest Ref Range: 120 - 384 ug/dL 417 (H) 305   TIBC Latest Ref Range: 236 - 444 ug/dL 458 (H) 371   %SAT Latest Ref Range: 21 - 57 % 9 (L) 18 (L)   Ferritin Latest Ref Range: 9 - 269 ng/ml 13 90 291 (H)    RADIOGRAPHIC STUDIES: Mm Digital Screening Bilateral  07/31/2013   CLINICAL DATA:  Screening.  EXAM: DIGITAL SCREENING BILATERAL MAMMOGRAM WITH CAD  COMPARISON:  Previous exam(s).  ACR Breast Density Category c: The breast tissue is heterogeneously dense, which may obscure small masses.  FINDINGS: There are no findings suspicious for malignancy. Images were processed with CAD.  IMPRESSION: No mammographic evidence of malignancy. A result letter of this screening mammogram will be mailed directly to the patient.  RECOMMENDATION: Screening mammogram in one year. (Code:SM-B-01Y)  BI-RADS CATEGORY  1: Negative.   Electronically Signed   By: Abelardo Diesel M.D.   On: 07/31/2013 09:01   ASSESSMENT: [name] 65 y.o. female with a history of Iron deficiency anemia due to chronic blood loss  History of porphyria  Gastrointestinal hemorrhage, unspecified gastrointestinal hemorrhage type  Cirrhosis of liver without ascites, unspecified hepatic cirrhosis type ([place])   PLAN:  1. Iron deficiency anemia secondary to GI loss -History of multiple GI bleeding in 2016, required blood transfusion, hospitalization and IV Feraheme -she required blood transfusion periodically, much less lately  -She has been receiving IV Feraheme every 2 months on  average lately -I recommend close follow-up, we'll check her CBC and ferritin every 4 weeks -Due to her PCT, we'll be cautious with IV iron. Her PCP has improved lately due to her successfully treated hepatitis C -Will give IV Feraheme if her hemoglobin drops below 10 with low ferritin/iorn level  -Her anemia has resolved lately after IV Feraheme -Today's iron study results still pending, I'll call tomorrow and set up IV Feraheme if needed.  2. Porphyria cutaneous tarda (PCT) -per pt, she had significant sun sensitivity which caused skin blisters on her arms and legs, she has multiple skin pigmentations from healed skin rash on her arms. -Both her urine and serum porphyria, especially uroporphyrin and coproporphyrin all elevated.  -This is likely secondary to her hepatitis C -She Has completed treatment for hepatitis C. Her PCT has overall improved    3. chronic hepatitis C infection -She was tested positive for hepatitis C in 2009. She has completed treatment for hepatitis C in 2016 and repeated test was negative.  -liver function has improved   4. DM2. --Management per PCP's office. Off metformin now  -overall well controlled   5. Left BKA -She has a prosthesis, she is wheelchair bound most of time, I encouraged her to continue physical therapy,   6. Smoking cessation -She is still actively smoking, not ready to quit   7. Fatigue and joint stiffness -If her I'll level is normal today, I suggest her to follow-up with her primary care physician, to ruled out hypothyroidism and other conditions which may cause her fatigue.  Plan  -Lab results reviewed with her, hemoglobin 13.8  Today, I'll study results still pending from today -Lab with CBC and ferritin every 4 weeks, she will call me after lab test, I would consider IV Feraheme if her anemia gets worse along with low ferritin level (<20)  -I will see her back in 3 months  All questions were answered. The patient knows to call the  clinic with any problems, questions or concerns. We can certainly see the patient much sooner if necessary.  I spent 15 minutes counseling the patient face to face. The total time spent in the appointment was 20 minutes.  Truitt Merle 04/09/2016

## 2016-04-09 NOTE — Telephone Encounter (Signed)
Gave patient avs report and appointments for December thru February.  °

## 2016-04-10 LAB — IRON AND TIBC
%SAT: 28 % (ref 21–57)
Iron: 86 ug/dL (ref 41–142)
TIBC: 304 ug/dL (ref 236–444)
UIBC: 218 ug/dL (ref 120–384)

## 2016-04-10 LAB — FERRITIN: FERRITIN: 82 ng/mL (ref 9–269)

## 2016-04-13 DIAGNOSIS — G933 Postviral fatigue syndrome: Secondary | ICD-10-CM | POA: Diagnosis not present

## 2016-04-16 ENCOUNTER — Telehealth: Payer: Self-pay

## 2016-04-16 DIAGNOSIS — E119 Type 2 diabetes mellitus without complications: Secondary | ICD-10-CM | POA: Diagnosis not present

## 2016-04-16 NOTE — Telephone Encounter (Signed)
Needs to speak with a nurse regarding gout.

## 2016-04-17 NOTE — Telephone Encounter (Signed)
appt made for wed 11/29 at pt's choosing

## 2016-04-25 ENCOUNTER — Ambulatory Visit (INDEPENDENT_AMBULATORY_CARE_PROVIDER_SITE_OTHER): Payer: Medicare Other | Admitting: Internal Medicine

## 2016-04-25 ENCOUNTER — Other Ambulatory Visit: Payer: Self-pay | Admitting: Internal Medicine

## 2016-04-25 DIAGNOSIS — M10071 Idiopathic gout, right ankle and foot: Secondary | ICD-10-CM

## 2016-04-25 DIAGNOSIS — M109 Gout, unspecified: Secondary | ICD-10-CM

## 2016-04-25 MED ORDER — COLCHICINE 0.6 MG PO TABS: 0.6000 mg | ORAL_TABLET | Freq: Two times a day (BID) | ORAL | 2 refills | Status: DC

## 2016-04-25 MED FILL — COLCHICINE 0.6 MG TABLET: 0.6 | 15 days supply | Qty: 30 | Fill #0

## 2016-04-25 NOTE — Progress Notes (Signed)
   CC: Right great toe pain  HPI:  [name] is a 65 y.o. woman with PMHx as noted below who presents today for right great toe pain.  She was recently treated for gout of the right great toe with Colchicine in October. She reports her flare improved with the Colchicine, but then she ran out about 2 weeks ago and then had swelling and redness again. She states the pain is intense, 10/10 in severity, and that she can barely touch her foot without having severe pain.     Past Medical History:  Diagnosis Date  . Allergic rhinitis   . Anemia, iron deficiency 05/03/2011   recieves iron infusions as well as periodic red blood cell transfusions  . Anxiety   . Colon polyps   . Diabetes mellitus without complication ([place])   . Difficulty sleeping    takes trazadone for sleep  . Diverticulosis   . GERD (gastroesophageal reflux disease)   . GI bleeding    Has had bleeding from small bowel AVMs as well as from colon diverticulosis.  . Hepatitis C    Began Harvoni early 82016. completion date late 02/2015.   Marland Kitchen Hypertension   . Hypokalemic alkalosis   . PCT (porphyria cutanea tarda) ([place])   . Peripheral vascular disease ([place])   . Stroke [facility]) 2010   no residual problems    Review of Systems:  All negative except per HPI  Physical Exam:  Vitals:   04/25/16 1518  BP: (!) 188/95  Pulse: 71  Temp: 98.1 F (36.7 C)  TempSrc: Oral  SpO2: 100%  Height: 5\' 3"  (1.6 m)  General: alert, cooperative, appears uncomfortable Ext: Her right great toe is erythematous and swollen. Patient has significant tenderness, guarding her foot even to very light touch.  Assessment & Plan:   See Encounters Tab for problem based charting.  Patient discussed with Dr. Angelia Mould

## 2016-04-25 NOTE — Patient Instructions (Signed)
General Instructions: - Take Colchicine 1.2 mg (2 pills) once as soon as you get the prescription tonight followed by 0.6 mg (1 pill) about 1 hour later - Tomorrow start taking Colchicine 1 pill twice daily - Follow up in 6 weeks   Please bring your medicines with you each time you come to clinic.  Medicines may include prescription medications, over-the-counter medications, herbal remedies, eye drops, vitamins, or other pills.   Progress Toward Treatment Goals:  Treatment Goal 05/14/2014  Blood pressure -  Stop smoking -  Prevent falls at goal    Self Care Goals & Plans:  Self Care Goal 04/01/2015  Manage my medications take my medicines as prescribed; refill my medications on time  Monitor my health -  Eat healthy foods -  Be physically active -  Stop smoking go to the [place] website ([email or web address].info/)  Prevent falls -    No flowsheet data found.   Care Management & Community Referrals:  Referral 05/14/2014  Referrals made for care management support [facility] case management program

## 2016-04-25 NOTE — Progress Notes (Deleted)
   CC: ***  HPI:  [name] is a 65 y.o.   Past Medical History:  Diagnosis Date  . Allergic rhinitis   . Anemia, iron deficiency 05/03/2011   recieves iron infusions as well as periodic red blood cell transfusions  . Anxiety   . Colon polyps   . Diabetes mellitus without complication ([place])   . Difficulty sleeping    takes trazadone for sleep  . Diverticulosis   . GERD (gastroesophageal reflux disease)   . GI bleeding    Has had bleeding from small bowel AVMs as well as from colon diverticulosis.  . Hepatitis C    Began Harvoni early 82016. completion date late 02/2015.   Marland Kitchen Hypertension   . Hypokalemic alkalosis   . PCT (porphyria cutanea tarda) ([place])   . Peripheral vascular disease ([place])   . Stroke [facility]) 2010   no residual problems    Review of Systems:  ***  Physical Exam:  Vitals:   04/25/16 1518  BP: (!) 188/95  Pulse: 71  Temp: 98.1 F (36.7 C)  TempSrc: Oral  SpO2: 100%  Height: 5\' 3"  (1.6 m)   ***  Assessment & Plan:   See Encounters Tab for problem based charting.  Patient {GC/GE:3044014::"discussed with","seen with"} Dr. {NAMES:[id number]::"Butcher","Granfortuna","E. Hoffman","Klima","Mullen","Narendra","Vincent"}

## 2016-04-27 ENCOUNTER — Encounter: Payer: Self-pay | Admitting: Internal Medicine

## 2016-04-27 NOTE — Assessment & Plan Note (Addendum)
This will be her second gout flare. Will treat with Colchicine 1.8 mg tonight and then 0.6 BID thereafter. I advised her to continue this for 6 weeks and then follow up in clinic. To reduce her risks of future attacks, can consider switching her to different BP medications as both HCTZ and Losartan could put her at risk for further gout attacks. We also discussed the foods/beverages she should avoid to lower her risk of recurrent attacks. Since this is her 2nd attack this year can consider placing her on urate lowering medication such as Allopurinol, but would work on lowering her risk factors (change BP meds, diet changes, etc) first.

## 2016-05-01 NOTE — Progress Notes (Signed)
Internal Medicine Clinic Attending  Case discussed with Dr. Rivet at the time of the visit.  We reviewed the resident's history and exam and pertinent patient test results.  I agree with the assessment, diagnosis, and plan of care documented in the resident's note.  

## 2016-05-07 ENCOUNTER — Other Ambulatory Visit (HOSPITAL_BASED_OUTPATIENT_CLINIC_OR_DEPARTMENT_OTHER): Payer: Medicare Other

## 2016-05-07 DIAGNOSIS — D500 Iron deficiency anemia secondary to blood loss (chronic): Secondary | ICD-10-CM | POA: Diagnosis not present

## 2016-05-07 DIAGNOSIS — K31819 Angiodysplasia of stomach and duodenum without bleeding: Secondary | ICD-10-CM

## 2016-05-07 LAB — CBC WITH DIFFERENTIAL/PLATELET
BASO%: 0.7 % (ref 0.0–2.0)
BASOS ABS: 0.1 10*3/uL (ref 0.0–0.1)
EOS%: 1.4 % (ref 0.0–7.0)
Eosinophils Absolute: 0.1 10*3/uL (ref 0.0–0.5)
HEMATOCRIT: 46.2 % (ref 34.8–46.6)
HEMOGLOBIN: 15.1 g/dL (ref 11.6–15.9)
LYMPH#: 1.7 10*3/uL (ref 0.9–3.3)
LYMPH%: 20.2 % (ref 14.0–49.7)
MCH: 29.7 pg (ref 25.1–34.0)
MCHC: 32.7 g/dL (ref 31.5–36.0)
MCV: 90.8 fL (ref 79.5–101.0)
MONO#: 0.8 10*3/uL (ref 0.1–0.9)
MONO%: 9.2 % (ref 0.0–14.0)
NEUT%: 68.5 % (ref 38.4–76.8)
NEUTROS ABS: 5.8 10*3/uL (ref 1.5–6.5)
Platelets: 263 10*3/uL (ref 145–400)
RBC: 5.08 10*6/uL (ref 3.70–5.45)
RDW: 15.1 % — AB (ref 11.2–14.5)
WBC: 8.5 10*3/uL (ref 3.9–10.3)

## 2016-05-07 LAB — FERRITIN: FERRITIN: 56 ng/mL (ref 9–269)

## 2016-05-14 DIAGNOSIS — G933 Postviral fatigue syndrome: Secondary | ICD-10-CM | POA: Diagnosis not present

## 2016-05-23 ENCOUNTER — Other Ambulatory Visit: Payer: Self-pay | Admitting: Internal Medicine

## 2016-06-04 ENCOUNTER — Other Ambulatory Visit (HOSPITAL_BASED_OUTPATIENT_CLINIC_OR_DEPARTMENT_OTHER): Payer: Medicare Other

## 2016-06-04 DIAGNOSIS — D500 Iron deficiency anemia secondary to blood loss (chronic): Secondary | ICD-10-CM | POA: Diagnosis not present

## 2016-06-04 LAB — CBC WITH DIFFERENTIAL/PLATELET
BASO%: 0.4 % (ref 0.0–2.0)
BASOS ABS: 0 10*3/uL (ref 0.0–0.1)
EOS%: 1.3 % (ref 0.0–7.0)
Eosinophils Absolute: 0.1 10*3/uL (ref 0.0–0.5)
HEMATOCRIT: 43.4 % (ref 34.8–46.6)
HEMOGLOBIN: 14.4 g/dL (ref 11.6–15.9)
LYMPH#: 2.3 10*3/uL (ref 0.9–3.3)
LYMPH%: 25.9 % (ref 14.0–49.7)
MCH: 30.5 pg (ref 25.1–34.0)
MCHC: 33.1 g/dL (ref 31.5–36.0)
MCV: 91.9 fL (ref 79.5–101.0)
MONO#: 1 10*3/uL — ABNORMAL HIGH (ref 0.1–0.9)
MONO%: 11.6 % (ref 0.0–14.0)
NEUT#: 5.5 10*3/uL (ref 1.5–6.5)
NEUT%: 60.8 % (ref 38.4–76.8)
Platelets: 277 10*3/uL (ref 145–400)
RBC: 4.72 10*6/uL (ref 3.70–5.45)
RDW: 13.4 % (ref 11.2–14.5)
WBC: 9 10*3/uL (ref 3.9–10.3)

## 2016-06-04 MED FILL — COLCHICINE 0.6 MG TABLET: 0.6 | 15 days supply | Qty: 30 | Fill #1

## 2016-06-05 LAB — IRON AND TIBC
%SAT: 32 % (ref 21–57)
IRON: 111 ug/dL (ref 41–142)
TIBC: 350 ug/dL (ref 236–444)
UIBC: 239 ug/dL (ref 120–384)

## 2016-06-05 LAB — FERRITIN: FERRITIN: 41 ng/mL (ref 9–269)

## 2016-06-14 DIAGNOSIS — G933 Postviral fatigue syndrome: Secondary | ICD-10-CM | POA: Diagnosis not present

## 2016-06-29 NOTE — Progress Notes (Signed)
[place], MD [place] [address]  DIAGNOSIS: Iron deficiency anemia due to chronic blood loss - Plan: CBC with Differential, Comprehensive metabolic panel  Gastrointestinal hemorrhage, unspecified gastrointestinal hemorrhage type  Cirrhosis of liver without ascites, unspecified hepatic cirrhosis type ([place])  History of porphyria.   PROBLEM LIST:  1. Recurrent iron-deficiency anemia secondary to gastrointestinal bleeding (as noted above). Ms. Mastin also has required red cell transfusions in the past, most recently early December 2012. She underwent a small bowel enteroscopy by Dr. Dan Humphreys at [facility] on 08/13/2011. The enteroscope was passed up to 6 feet into the jejunum. He saw multiple AVMs, which were ablated. Two of the large AVMs bled during APC. These were in the 4th part of the duodenum very close to the ligament of Treitz. The remainder of the AVMs were in the proximal jejunum. In addition to the small bowel AV malformations, which were treated with APC, he saw some small esophageal varices which were not bleeding and also evidence of mild portal hypertensive gastropathy.  2. History of duodenal arteriovenous malformation.  3. History of hepatitis C infection.  4. Gastroesophageal reflux disease.  5. Depression.  6. Hypertension.  7. History of right Bell's palsy.  8. Status post left above-knee amputation secondary to necrotizing fasciitis 03/06/2009.  9. Neurodermatitis.  10. Previous history of alcohol usage.  11. Admission to the hospital from 12/20/2011 through 12/25/2011 for hematochezia felt to be secondary to a diverticular bleed, requiring 2 units of packed red cells.  12. Diverticulosis.  13. Elevated alpha fetoprotein, 72.3 on 11/19/2011 and 76.0 on 01/11/2012.  14. Abnormal MRI of the liver from 11/28/2011 showing diffuse and markedly low  signal intensity throughout the liver as well as diffuse low signal intensity in the adrenal glands. 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 There were no findings for cirrhosis, portal hypertension, splenomegaly or ascites, not were there any worrisome enhancing liver lesions.  15. Systolic ejection murmur.   CHIEF COMPLAIN: Follow up anemia  PRIOR TREATMENT: The patient has been heavily dependent upon IV Feraheme. Over the past year or so she has been receiving about 1 Feraheme infusion monthly on average. The patient's most recent infusions of IV Feraheme 510 mg occurred on the following dates: 06/05/2011, 07/03/2011, 07/31/2011, 08/17/2011, 08/28/2011. The patient received IV Feraheme 1020 mg on 10/02/2011 01/30/2012, 06/06/2012 and 09/10/2012, 05/2013, 07/2013, 08/2013, 09/2013, 11/12/2013, 01/15/2014 and 03/16/2014, 09/16/2014 and 09/23/2014, 11/2014 , 02/24/2015,  04/07/2015, 08/03/2015, 10/03/2015, 12/16/2015, 01/2016 and 02/27/2016  CURRENT TREATMENT: As noted above. Feraheme prn, will be cautious for iron supplement due to PCT  INTERVAL HISTORY:  [name] 66 y.o. female with history of recurrent iron-deficiency anemia due to GI bleeding is here for follow-up. GI bleeding is probably due to AV malformations and diverticulosis. She came to the clinic in a wheelchair by herself today. Her right knee has been bothering her and swelling. She is not sure if she has arthritis but does admit to gout. She denies redness or hot feeling in the knee. She uses a wheelchair at home too. States she no longer has to take metformin, she does not recall which doctor told her she could. She does not recall if she has ever taken oral iron.  She is fatigued today but figured it was secondary to staying up late watching the Superbowl last night.   MEDICAL HISTORY: Past  Medical History:  Diagnosis Date  . Allergic rhinitis   . Anemia, iron deficiency 05/03/2011   recieves iron  infusions as well as periodic red blood cell transfusions  . Anxiety   . Colon polyps   . Diabetes mellitus without complication ([place])   . Difficulty sleeping    takes trazadone for sleep  . Diverticulosis   . GERD (gastroesophageal reflux disease)   . GI bleeding    Has had bleeding from small bowel AVMs as well as from colon diverticulosis.  . Hepatitis C    Began Harvoni early 82016. completion date late 02/2015.   Marland Kitchen Hypertension   . Hypokalemic alkalosis   . PCT (porphyria cutanea tarda) ([place])   . Peripheral vascular disease ([place])   . Stroke [facility]) 2010   no residual problems     ALLERGIES:  is allergic to ciprofloxacin; morphine and related; penicillins; codeine; lisinopril; and feraheme [ferumoxytol].  MEDICATIONS: has a current medication list which includes the following prescription(s): aspirin ec, atenolol, cetirizine, cholecalciferol, colchicine, fluoxetine, gabapentin, hydrochlorothiazide, losartan, omeprazole, potassium chloride, trazodone, vitamin c, ferrous sulfate, and tramadol.  SURGICAL HISTORY:  Past Surgical History:  Procedure Laterality Date  . ABDOMINAL HYSTERECTOMY    . APPLICATION OF WOUND VAC N/A 06/21/2014   Procedure: APPLICATION OF WOUND VAC;  Surgeon: Coralie Keens, MD;  Location: [facility];  Service: General;  Laterality: N/A;  . CARPAL TUNNEL RELEASE     rt hand  . ENTEROSCOPY N/A 12/04/2012   Procedure: ENTEROSCOPY;  Surgeon: Beryle Beams, MD;  Location: [facility];  Service: Endoscopy;  Laterality: N/A;  . ENTEROSCOPY N/A 12/18/2012   Procedure: ENTEROSCOPY;  Surgeon: Beryle Beams, MD;  Location: [facility];  Service: Endoscopy;  Laterality: N/A;  . ENTEROSCOPY N/A 02/25/2015   Procedure: ENTEROSCOPY;  Surgeon: Inda Castle, MD;  Location: [facility];  Service: Endoscopy;  Laterality: N/A;  . ESOPHAGOGASTRODUODENOSCOPY  05/05/2011   Procedure: ESOPHAGOGASTRODUODENOSCOPY (EGD);  Surgeon: Zenovia Jarred, MD;  Location: [facility];  Service:  Gastroenterology;  Laterality: N/A;  Dr. Hilarie Fredrickson will do procedure for Dr. Benson Norway Saturday.  . ESOPHAGOGASTRODUODENOSCOPY  05/08/2011   Procedure: ESOPHAGOGASTRODUODENOSCOPY (EGD);  Surgeon: Beryle Beams;  [facility];  Service: Endoscopy;  Laterality: N/A;  . ESOPHAGOGASTRODUODENOSCOPY  06/07/2011   Procedure: ESOPHAGOGASTRODUODENOSCOPY (EGD);  Surgeon: Beryle Beams, MD;  Location: [facility];  Service: Endoscopy;  Laterality: N/A;  . ESOPHAGOGASTRODUODENOSCOPY  12/20/2011   Procedure: ESOPHAGOGASTRODUODENOSCOPY (EGD);  Surgeon: Beryle Beams, MD;  Location: [facility];  Service: Endoscopy;  Laterality: N/A;  . EYE SURGERY Bilateral    cataracts  . FLEXIBLE SIGMOIDOSCOPY  12/21/2011   Procedure: FLEXIBLE SIGMOIDOSCOPY;  Surgeon: Beryle Beams, MD;  [facility];  Service: Endoscopy;  Laterality: N/A;  . HOT HEMOSTASIS  06/07/2011   Procedure: HOT HEMOSTASIS (ARGON PLASMA COAGULATION/BICAP);  Surgeon: Beryle Beams, MD;  Location: [facility];  Service: Endoscopy;  Laterality: N/A;  . INCISIONAL HERNIA REPAIR N/A 06/01/2014   Procedure: ATTEMPTED LAPAROSCOPIC AND OPEN INCISIONAL HERNIA REPAIR WITH MESH;  Surgeon: Coralie Keens, MD;  Location: [place];  Service: General;  Laterality: N/A;  . INSERTION OF MESH N/A 06/01/2014   Procedure: INSERTION OF MESH;  Surgeon: Coralie Keens, MD;  Location: [facility];  Service: General;  Laterality: N/A;  . LAPAROTOMY  02/16/2012   Procedure: EXPLORATORY LAPAROTOMY;  Surgeon: Edward Jolly, MD;  Location: [facility];  Service: General;  Laterality: N/A;  oversewing of anastomotic leak and rigid sigmoidoscopy  .  LAPAROTOMY N/A 06/21/2014   Procedure: ABDOMINAL WOUND EXPLORATION;  Surgeon: Coralie Keens, MD;  Location: [facility];  Service: General;  Laterality: N/A;  . LEG AMPUTATION ABOVE KNEE    . PARTIAL COLECTOMY  02/15/2012   Procedure: PARTIAL COLECTOMY;  Surgeon: Harl Bowie, MD;  Location: [facility];  Service: General;   Laterality: N/A;  . TONSILLECTOMY     Allergies as of 07/02/2016      Reactions   Ciprofloxacin Hives, Swelling   Morphine And Related Other (See Comments)   Makes pt sad and paranoid   Penicillins Hives, Swelling   Has patient had a PCN reaction causing immediate rash, facial/tongue/throat swelling, SOB or lightheadedness with hypotension:YES Has patient had a PCN reaction causing severe rash involving mucus membranes or skin necrosis:UNSURE Has patient had a PCN reaction that required hospitalization:YES Has patient had a PCN reaction occurring within the last 10 years:No If all of the above answers are "NO", then may proceed with Cephalosporin use.   Codeine Hives, Swelling   Lisinopril Cough   New onset dry cough after starting lisinopril. Resolved upon switching to losartan.   Feraheme [ferumoxytol] Itching   Tolerated Feraheme 6/26 & 7/21 pre-medications prior to medication      Medication List       Accurate as of 07/02/16  4:30 PM. Always use your most recent med list.          aspirin EC 81 MG tablet Take 1 tablet (81 mg total) by mouth daily.   atenolol 50 MG tablet Commonly known as:  TENORMIN TAKE 1 TABLET BY MOUTH EVERY MORNING   cetirizine 10 MG tablet Commonly known as:  ZYRTEC TAKE 1 TABLET BY MOUTH DAILY   cholecalciferol 1000 units tablet Commonly known as:  VITAMIN D TAKE ONE TABLET BY MOUTH DAILY.   colchicine 0.6 MG tablet Take 1 tablet (0.6 mg total) by mouth 2 (two) times daily.   ferrous sulfate 325 (65 FE) MG EC tablet Take 1 tablet (325 mg total) by mouth daily after breakfast.   FLUoxetine 40 MG capsule Commonly known as:  PROZAC TAKE 1 CAPSULE BY MOUTH DAILY   gabapentin 400 MG capsule Commonly known as:  NEURONTIN TAKE 2 CAPSULES BY MOUTH THREE TIMES A DAY   hydrochlorothiazide 25 MG tablet Commonly known as:  HYDRODIURIL TAKE 1 TABLET BY MOUTH EVERY MORNING   losartan 50 MG tablet Commonly known as:  COZAAR TAKE 1 TABLET BY  MOUTH EVERY DAY   omeprazole 20 MG capsule Commonly known as:  PRILOSEC TAKE 1 CAPSULE BY MOUTH EVERY DAY **STOP NEXIUM**   potassium chloride 20 MEQ/15ML (10%) Soln TAKE 15ML BY MOUTH EVERY DAY   traMADol 50 MG tablet Commonly known as:  ULTRAM Take 50 mg by mouth every 6 (six) hours as needed (for pain.).   traZODone 50 MG tablet Commonly known as:  DESYREL TAKE 1 TABLET BY MOUTH EVERY NIGHT AT BEDTIME   vitamin C 500 MG tablet Commonly known as:  ASCORBIC ACID Take 500 mg by mouth every morning.        REVIEW OF SYSTEMS:   Constitutional: Denies fevers, chills or abnormal weight loss;  (+) mild fatigue Eyes: Denies blurriness of vision Ears, nose, mouth, throat, and face: Denies mucositis or sore throat Respiratory: Denies cough, dyspnea or wheezes Cardiovascular: Denies palpitation, chest discomfort (+) right knee discomfort and swelling Gastrointestinal:  Denies nausea, heartburn or change in bowel habits, hernia is painful and bleeds sometime the overlying skin. Skin:  Denies abnormal skin rashes Lymphatics: Denies new lymphadenopathy or easy bruising Neurological:Denies numbness, tingling or new weaknesses Behavioral/Psych: Mood is stable, no new changes  All other systems were reviewed with the patient and are negative.  PHYSICAL EXAMINATION: ECOG PERFORMANCE STATUS: 2 BP (!) 159/90 (BP Location: Left Arm, Patient Position: Sitting)   Pulse 89   Temp 97.8 F (36.6 C) (Oral)   Resp 18   Ht 5\' 3"  (1.6 m)   Wt 172 lb 4.8 oz (78.2 kg)   SpO2 100%   BMI 30.52 kg/m  General appearance: alert, appears stated age, no distress and moderately obese  Skin: she has multiple skin pigmentations from previous rash on her arms and legs.  Head: Normocephalic, without obvious abnormality, atraumatic  Neck: no adenopathy, supple, symmetrical, trachea midline and thyroid not enlarged, symmetric, no tenderness/mass/nodules  Lymph nodes: Cervical adenopathy: None appreciated   Heart:regular rate and rhythm, S1, S2 normal and systolic murmur 2/6 Lung:chest clear, no wheezing, rales, normal symmetric air entry, Heart exam - S1, S2 normal, no murmur, no gallop, rate regular  Abdomen: soft, distended, normal bowel sounds, nontender EXT:No peripheral edema on right extremity; Left above the knee amputation. She is wearing a prothesis.   Labs:  CBC Latest Ref Rng & Units 07/02/2016 06/04/2016 05/07/2016  WBC 3.9 - 10.3 10e3/uL 6.1 9.0 8.5  Hemoglobin 11.6 - 15.9 g/dL 12.5 14.4 15.1  Hematocrit 34.8 - 46.6 % 37.7 43.4 46.2  Platelets 145 - 400 10e3/uL 217 277 263    CMP Latest Ref Rng & Units 07/02/2016 01/16/2016 12/26/2015  Glucose 70 - 140 mg/dl 341(H) 122 118  BUN 7.0 - 26.0 mg/dL 32.6(H) 18.4 20.0  Creatinine 0.6 - 1.1 mg/dL 1.2(H) 1.0 1.0  Sodium 136 - 145 mEq/L 140 139 140  Potassium 3.5 - 5.1 mEq/L 4.1 3.9 4.6  Chloride 101 - 111 mmol/L - - -  CO2 22 - 29 mEq/L 25 25 23   Calcium 8.4 - 10.4 mg/dL 8.6 9.2 9.4  Total Protein 6.4 - 8.3 g/dL 6.9 7.1 7.7  Total Bilirubin 0.20 - 1.20 mg/dL 0.30 <0.30 <0.30  Alkaline Phos 40 - 150 U/L 92 82 93  AST 5 - 34 U/L 19 19 26   ALT 0 - 55 U/L 18 14 18    Results for CLARABELL, GRABOSKI (MRN [id number])   Ref. Range 06/04/2016 14:36  Iron Latest Ref Range: 41 - 142 ug/dL 111  UIBC Latest Ref Range: 120 - 384 ug/dL 239  TIBC Latest Ref Range: 236 - 444 ug/dL 350  %SAT Latest Ref Range: 21 - 57 % 32  Ferritin Latest Ref Range: 9 - 269 ng/ml 41   RADIOGRAPHIC STUDIES: Mammogram Digital Screening Bilateral 09/21/15 IMPRESSION: No mammographic evidence of malignancy. A result letter of this screening mammogram will be mailed directly to the patient.  ASSESSMENT: [name] 67 y.o. female with a history of Iron deficiency anemia due to chronic blood loss - Plan: CBC with Differential, Comprehensive metabolic panel  Gastrointestinal hemorrhage, unspecified gastrointestinal hemorrhage type  Cirrhosis of liver without ascites,  unspecified hepatic cirrhosis type ([place])  History of porphyria   PLAN:  1. Iron deficiency anemia secondary to GI loss -History of multiple GI bleeding in 2016, required blood transfusion, hospitalization and IV Feraheme -she required blood transfusion periodically, much less lately  -She has been receiving IV Feraheme every 2 months on average lately -I recommend close follow-up, we'll check her CBC and ferritin every 4 weeks -Due to her PCT, we'll be  cautious with IV iron. Her PCT has resolved lately due to her successfully treated hepatitis C, I'll be okay to be more liberal to replace his iron deficiency. -Today's ferritin study results still pending. I have encouraged the patient to begin taking oral iron. I have prescribed ferrous sulfate 325 mg daily today. I'll monitor his our level closely, to avoid over replacement. -IV Feraheme if she is anemic or significant low iron level.  2. Porphyria cutaneous tarda (PCT) -per pt, she had significant sun sensitivity which caused skin blisters on her arms and legs, she has multiple skin pigmentations from healed skin rash on her arms. -Both her urine and serum porphyria, especially uroporphyrin and coproporphyrin all elevated.  -This is likely secondary to her hepatitis C -She Has completed treatment for hepatitis C. Her PCT has overall improved     3. chronic hepatitis C infection -She was tested positive for hepatitis C in 2009. She has completed treatment for hepatitis C in 2016 and repeated test was negative.  -liver function has improved  -She was seen by GI Dr. Deatra Ina before, who has left the practice. She will set up an appointment with [facility]. She was given their clinic's information today.  4. DM2. --Management per PCP's office. Off metformin now  -Glucose is 341 TODAY. I encouraged her to restart her metformin. She has appointment to see her primary care physician later this week   5. Left BKA -She has a prosthesis, she is  wheelchair bound most of time, I encouraged her to continue physical therapy,   6. Smoking cessation -She is still actively smoking, not ready to quit   7. Right knee pain  -likely secondary to osteoarthritis. She will follow up with PCP   Plan  -Lab results reviewed with her, hemoglobin 12.5 today, iron study results still pending from today -I have prescribed ferrous sulfate 325 mg daily today. -Lab in 6 weeks -I will see her back in 3 months  All questions were answered. The patient knows to call the clinic with any problems, questions or concerns. We can certainly see the patient much sooner if necessary.  I spent 20 minutes counseling the patient face to face. The total time spent in the appointment was 25 minutes.  This document serves as a record of services personally performed by Truitt Merle, MD. It was created on her behalf by Arlyce Harman, a trained medical scribe. The creation of this record is based on the scribe's personal observations and the provider's statements to them. This document has been checked and approved by the attending provider.  Truitt Merle 07/02/2016

## 2016-07-02 ENCOUNTER — Telehealth: Payer: Self-pay | Admitting: Hematology

## 2016-07-02 ENCOUNTER — Ambulatory Visit (HOSPITAL_BASED_OUTPATIENT_CLINIC_OR_DEPARTMENT_OTHER): Payer: Medicare Other | Admitting: Hematology

## 2016-07-02 ENCOUNTER — Other Ambulatory Visit (HOSPITAL_BASED_OUTPATIENT_CLINIC_OR_DEPARTMENT_OTHER): Payer: Medicare Other

## 2016-07-02 ENCOUNTER — Encounter: Payer: Self-pay | Admitting: Hematology

## 2016-07-02 VITALS — BP 159/90 | HR 89 | Temp 97.8°F | Resp 18 | Ht 63.0 in | Wt 172.3 lb

## 2016-07-02 DIAGNOSIS — M25561 Pain in right knee: Secondary | ICD-10-CM

## 2016-07-02 DIAGNOSIS — D500 Iron deficiency anemia secondary to blood loss (chronic): Secondary | ICD-10-CM | POA: Diagnosis not present

## 2016-07-02 DIAGNOSIS — K922 Gastrointestinal hemorrhage, unspecified: Secondary | ICD-10-CM

## 2016-07-02 DIAGNOSIS — Z8639 Personal history of other endocrine, nutritional and metabolic disease: Secondary | ICD-10-CM

## 2016-07-02 DIAGNOSIS — Z720 Tobacco use: Secondary | ICD-10-CM

## 2016-07-02 DIAGNOSIS — E119 Type 2 diabetes mellitus without complications: Secondary | ICD-10-CM

## 2016-07-02 DIAGNOSIS — B1920 Unspecified viral hepatitis C without hepatic coma: Secondary | ICD-10-CM

## 2016-07-02 DIAGNOSIS — K7460 Unspecified cirrhosis of liver: Secondary | ICD-10-CM

## 2016-07-02 LAB — COMPREHENSIVE METABOLIC PANEL
ALBUMIN: 3.4 g/dL — AB (ref 3.5–5.0)
ALK PHOS: 92 U/L (ref 40–150)
ALT: 18 U/L (ref 0–55)
AST: 19 U/L (ref 5–34)
Anion Gap: 8 mEq/L (ref 3–11)
BILIRUBIN TOTAL: 0.3 mg/dL (ref 0.20–1.20)
BUN: 32.6 mg/dL — ABNORMAL HIGH (ref 7.0–26.0)
CALCIUM: 8.6 mg/dL (ref 8.4–10.4)
CO2: 25 mEq/L (ref 22–29)
Chloride: 106 mEq/L (ref 98–109)
Creatinine: 1.2 mg/dL — ABNORMAL HIGH (ref 0.6–1.1)
EGFR: 53 mL/min/{1.73_m2} — AB (ref >90)
GLUCOSE: 341 mg/dL — AB (ref 70–140)
Potassium: 4.1 mEq/L (ref 3.5–5.1)
SODIUM: 140 meq/L (ref 136–145)
TOTAL PROTEIN: 6.9 g/dL (ref 6.4–8.3)

## 2016-07-02 LAB — CBC WITH DIFFERENTIAL/PLATELET
BASO%: 0.2 % (ref 0.0–2.0)
Basophils Absolute: 0 10*3/uL (ref 0.0–0.1)
EOS%: 2 % (ref 0.0–7.0)
Eosinophils Absolute: 0.1 10*3/uL (ref 0.0–0.5)
HCT: 37.7 % (ref 34.8–46.6)
HGB: 12.5 g/dL (ref 11.6–15.9)
LYMPH%: 31.6 % (ref 14.0–49.7)
MCH: 30.6 pg (ref 25.1–34.0)
MCHC: 33.2 g/dL (ref 31.5–36.0)
MCV: 92.4 fL (ref 79.5–101.0)
MONO#: 0.6 10*3/uL (ref 0.1–0.9)
MONO%: 9.9 % (ref 0.0–14.0)
NEUT#: 3.4 10*3/uL (ref 1.5–6.5)
NEUT%: 56.3 % (ref 38.4–76.8)
PLATELETS: 217 10*3/uL (ref 145–400)
RBC: 4.08 10*6/uL (ref 3.70–5.45)
RDW: 13.6 % (ref 11.2–14.5)
WBC: 6.1 10*3/uL (ref 3.9–10.3)
lymph#: 1.9 10*3/uL (ref 0.9–3.3)

## 2016-07-02 MED ORDER — FERROUS SULFATE 325 (65 FE) MG PO TBEC: 325.0000 mg | DELAYED_RELEASE_TABLET | Freq: Every day | ORAL | 1 refills | Status: DC

## 2016-07-02 NOTE — Telephone Encounter (Signed)
Appointments scheduled per 2/5 LOS. Patient given AVS report and calendars with future scheduled appointments. °

## 2016-07-03 ENCOUNTER — Encounter: Payer: Self-pay | Admitting: Gastroenterology

## 2016-07-03 LAB — FERRITIN: Ferritin: 24 ng/ml (ref 9–269)

## 2016-07-03 NOTE — Progress Notes (Signed)
CC: s/p AKA, decreased mobility  HPI:  [name] is a 66 y.o. with a PMH of iron deficiency anemia 2/2 GI AVM's, T2DM controlled with diet, h/o of Hep C (treated successfully with Harvoni), cirrhosis, HTN, and gout.   Cirrhosis: Patient with treated Hep C. She is to follow up with GI for further management and surveillance.  HTN: Patient on atenolol 50mg  daily, HCTZ 25mg  daily, amlodipine 10mg  daily, and losartan 50mg  daily. She denies chest pain, shortness of breath, headaches, vision changes. She   PWC face to face:  Medical conditions - Above the knee amputation, iron-deficiency anemia which requires periodic blood transfusions causing fatigue. Patient does have a left above knee prosthesis, she is unable to wear it all day due to discomfort, pain (7/10) and fatigue and is thus not able to complete all activities of daily living adequately without the power chair. Her upper extremity strength is 4/5, and lower extremity strength is 4/5 on right and 2/5 on left. Ambulation is impaired secondary to instability, pain (7/10) with ambulation. Patient needs a power chair as it will help to get to commode and bath (able to get close to it and decreases length of ambulation), to get to kitchen to get meals. Ambulation with cane/walker and manual wheelchair does not provide enough stability to complete activities of daily living safely and is impeded by fatigue secondary to her anemia.  A scooter is inappropriate as there is difficulty with transferring on and off of the scooter and lacks posterior support.  Patient has adequate dexterity and motivation to use the power chair to help with activities of daily living. Patient has 7 day per week care aid at home to help with activities of daily living and meal preparation.  Patient needs height adjustable arms as the patient spends more than 2 hours per day in the chair and is required for variable table heights in the home.  The patient  requires swing-away hardware in order to move the equipment out of the way so that she can perform slide transfer to a chair or bed.   Please see problem based Assessment and Plan for status of patients chronic conditions.  Past Medical History:  Diagnosis Date  . Allergic rhinitis   . Anemia, iron deficiency 05/03/2011   recieves iron infusions as well as periodic red blood cell transfusions  . Anxiety   . Colon polyps   . Diabetes mellitus without complication ([place])   . Difficulty sleeping    takes trazadone for sleep  . Diverticulosis   . GERD (gastroesophageal reflux disease)   . GI bleeding    Has had bleeding from small bowel AVMs as well as from colon diverticulosis.  . Hepatitis C    Began Harvoni early 82016. completion date late 02/2015.   Marland Kitchen Hypertension   . Hypokalemic alkalosis   . PCT (porphyria cutanea tarda) ([place])   . Peripheral vascular disease ([place])   . Stroke [facility]) 2010   no residual problems    Review of Systems:   Review of Systems  Constitutional: Positive for malaise/fatigue (recurrent when due for blood transfusion 2/2 chronic GI bleeding). Negative for chills and fever.  Eyes: Negative for blurred vision and double vision.  Respiratory: Negative for shortness of breath.   Cardiovascular: Negative for chest pain and palpitations.  Gastrointestinal: Negative for abdominal pain and blood in stool.  Genitourinary: Negative for dysuria.  Neurological: Positive for focal weakness (left leg). Negative for headaches.    Physical  Exam:  Vitals:   07/04/16 1326  BP: (!) 179/99  Pulse: 73  Temp: 97.9 F (36.6 C)  TempSrc: Oral  SpO2: 99%  Weight: 211 lb 12.8 oz (96.1 kg)  Height: 5\' 3"  (1.6 m)   Physical Exam  Constitutional: She is oriented to person, place, and time. She appears well-developed and well-nourished. She appears distressed (tearful when speaking about her limited mobility ).  HENT:  Head: Normocephalic and atraumatic.  Cardiovascular:  Normal rate.   Pulmonary/Chest: Effort normal.  Musculoskeletal: She exhibits no edema or tenderness.  S/p Left AKA UE strength 4/5 bilaterally LE strength: right 4/5, left 2/5 Gait: impaired, wide based stance, difficulty with bending and extending prosthetic knee joint which causes instability. Patient requires bracing to surroundings for getting up and sitting down to chair.   Neurological: She is alert and oriented to person, place, and time.  Skin: Skin is warm and dry.  Psychiatric: She has a normal mood and affect. Her behavior is normal. Judgment and thought content normal.    Assessment & Plan:   PWC face to face:  Medical conditions - Above the knee amputation, iron-deficiency anemia which requires periodic blood transfusions causing fatigue. Patient does have a left above knee prosthesis, she is unable to wear it all day due to discomfort, pain (7/10) and fatigue and is thus not able to complete all activities of daily living adequately without the power chair. Her upper extremity strength is 4/5, and lower extremity strength is 4/5 on right and 2/5 on left. Ambulation is impaired secondary to instability, pain (7/10) with ambulation. Patient needs a power chair as it will help to get to commode and bath (able to get close to it and decreases length of ambulation), to get to kitchen to get meals. Ambulation with cane/walker and manual wheelchair does not provide enough stability to complete activities of daily living safely and is impeded by fatigue secondary to her anemia.  A scooter is inappropriate as there is difficulty with transferring on and off of the scooter and lacks posterior support.  Patient has adequate dexterity and motivation to use the power chair to help with activities of daily living. Patient has 7 day per week care aid at home to help with activities of daily living and meal preparation.  Patient needs height adjustable arms as the patient spends more than 2 hours  per day in the chair and is required for variable table heights in the home.  The patient requires swing-away hardware in order to move the equipment out of the way so that she can perform slide transfer to a chair or bed  See Encounters Tab for problem based charting.   Patient seen with Dr. Abelardo Diesel, MD Internal Medicine PGY1

## 2016-07-04 ENCOUNTER — Ambulatory Visit (INDEPENDENT_AMBULATORY_CARE_PROVIDER_SITE_OTHER): Payer: Medicare Other | Admitting: Internal Medicine

## 2016-07-04 ENCOUNTER — Encounter: Payer: Self-pay | Admitting: Internal Medicine

## 2016-07-04 VITALS — BP 179/99 | HR 73 | Temp 97.9°F | Ht 63.0 in | Wt 211.8 lb

## 2016-07-04 DIAGNOSIS — Z8619 Personal history of other infectious and parasitic diseases: Secondary | ICD-10-CM

## 2016-07-04 DIAGNOSIS — K7460 Unspecified cirrhosis of liver: Secondary | ICD-10-CM

## 2016-07-04 DIAGNOSIS — Z7409 Other reduced mobility: Secondary | ICD-10-CM | POA: Diagnosis not present

## 2016-07-04 DIAGNOSIS — D508 Other iron deficiency anemias: Secondary | ICD-10-CM | POA: Diagnosis not present

## 2016-07-04 DIAGNOSIS — I10 Essential (primary) hypertension: Secondary | ICD-10-CM

## 2016-07-04 DIAGNOSIS — Z89612 Acquired absence of left leg above knee: Secondary | ICD-10-CM | POA: Diagnosis not present

## 2016-07-04 NOTE — Patient Instructions (Signed)
We completed paperwork for you to get a power chair today. Please come back in about a month so we can talk about and address your other chronic medical conditions.  It was nice seeing you today!

## 2016-07-07 NOTE — Assessment & Plan Note (Signed)
Patient atenolol 50mg  daily, HCTZ 25mg  daily, amlodipine 10mg  daily, and losartan 50mg  daily. She denies chest pain, shortness of breath, headaches, vision changes. BP in office is 170s/90s.  Plan: --increase losartan to 100mg  daily

## 2016-07-09 ENCOUNTER — Telehealth: Payer: Self-pay | Admitting: Internal Medicine

## 2016-07-09 DIAGNOSIS — D500 Iron deficiency anemia secondary to blood loss (chronic): Secondary | ICD-10-CM

## 2016-07-09 DIAGNOSIS — E1142 Type 2 diabetes mellitus with diabetic polyneuropathy: Secondary | ICD-10-CM

## 2016-07-09 NOTE — Telephone Encounter (Addendum)
Patient is requesting an order for her Ensure.  Can you place a DME OTHER for Pt's Ensure?  I will fax it to [facility] for the patient.

## 2016-07-10 ENCOUNTER — Telehealth: Payer: Self-pay | Admitting: *Deleted

## 2016-07-10 NOTE — Telephone Encounter (Signed)
-----   Message from Truitt Merle, MD sent at 07/07/2016  7:52 PM EST ----- Please let pt know her iron level from 2/5 was good, thanks.  Truitt Merle  07/07/2016

## 2016-07-10 NOTE — Telephone Encounter (Signed)
Called pt & informed of good iron level per Dr Ernestina Penna message.  Pt wants to know if she should take iron orally.  She states Dr Burr Medico told her to go ahead & get but not to take until she told her to.  Message to Dr Burr Medico.

## 2016-07-11 NOTE — Progress Notes (Signed)
Internal Medicine Clinic Attending  Case discussed with Dr. Svalina  at the time of the visit.  We reviewed the resident's history and exam and pertinent patient test results.  I agree with the assessment, diagnosis, and plan of care documented in the resident's note.  

## 2016-07-12 NOTE — Telephone Encounter (Signed)
Faxed Order to [facility] [phone] A. Pasty Spillers, RN BSN.

## 2016-07-13 DIAGNOSIS — G933 Postviral fatigue syndrome: Secondary | ICD-10-CM | POA: Diagnosis not present

## 2016-07-16 NOTE — Telephone Encounter (Signed)
Please let pt know OK to start oral iron. Thanks.   Truitt Merle MD

## 2016-07-19 NOTE — Telephone Encounter (Signed)
Called pt & informed OK to start ferrous sulfate daily per Dr Burr Medico.

## 2016-08-06 ENCOUNTER — Telehealth: Payer: Self-pay | Admitting: Hematology

## 2016-08-06 ENCOUNTER — Telehealth: Payer: Self-pay

## 2016-08-06 ENCOUNTER — Ambulatory Visit: Payer: Medicare Other | Admitting: Gastroenterology

## 2016-08-06 NOTE — Telephone Encounter (Signed)
sw pt re lab appt 3/15 at 1030 am per LOS

## 2016-08-06 NOTE — Telephone Encounter (Signed)
Pt agreed to have labs rescheduled for Thursday or Friday. She knows to wait in lobby. Urgent in basket sent.

## 2016-08-06 NOTE — Telephone Encounter (Signed)
Pt called stating she is spitting up blood. She is not vomiting. This is off and on. It started yesterday morning.  She does have an appt with [facility]. She was coughing last week but no bleeding just phlegm. She had a scratchy throat at the same time. Her throat is still a little scratchy. Gums not bleeding but roof of mouth is really slick. She has not been able to eat much. She is a little weak but not more than normal. Told her to keep her GI appt and call us if transportation cancels her ride.

## 2016-08-06 NOTE — Telephone Encounter (Signed)
Called pt and her transportation cancelled today and her appt with GI was rescheduled to 3/28 at 1330. Instructed pt to go to ER if bleeding worsens. Dr Burr Medico will call if she wants to see the pt sooner than 5/7. She does have lab on 3/19, but no MD.  She needs 3 day advance notice to scheduler her transportation.

## 2016-08-06 NOTE — Telephone Encounter (Signed)
OK to reschedule her lab from 3/19 to 3/15 or 3/16 to see if she needs blood transfusion or iv iron, please let pt know to see if she wants to reschedule. When she comes in for lab, please let her wait in the lobby after her blood draw and let my nurse know.   Truitt Merle MD  08/06/2016

## 2016-08-07 ENCOUNTER — Encounter: Payer: Self-pay | Admitting: *Deleted

## 2016-08-07 NOTE — Progress Notes (Signed)
[employer] representative received phone call form patient expressing transportation concerns.  Patient appointment was changed from 3/19 to 3/15.  Patient uses Medicaid transportation, and was worried because she would be unable to reschedule her transportation within the 3 day requirement.  CSw contacted Medicaid transportation and rescheduled patients appointment for 3/15.  CSW contacted patient to inform that her ride was scheduled for 3/15.  Patient was appreciative of CSW contact.     Johnnye Lana, MSW, LCSW, OSW-C Clinical Social Worker [facility] [phone]

## 2016-08-09 ENCOUNTER — Ambulatory Visit (HOSPITAL_BASED_OUTPATIENT_CLINIC_OR_DEPARTMENT_OTHER): Payer: Medicare Other | Admitting: Nurse Practitioner

## 2016-08-09 ENCOUNTER — Ambulatory Visit (HOSPITAL_COMMUNITY)
Admission: RE | Admit: 2016-08-09 | Discharge: 2016-08-09 | Disposition: A | Discharge status: Home / Self Care | Payer: Medicare Other | Source: Ambulatory Visit | Attending: Nurse Practitioner | Admitting: Nurse Practitioner

## 2016-08-09 ENCOUNTER — Telehealth: Payer: Self-pay | Admitting: Nurse Practitioner

## 2016-08-09 ENCOUNTER — Other Ambulatory Visit (HOSPITAL_BASED_OUTPATIENT_CLINIC_OR_DEPARTMENT_OTHER): Payer: Medicare Other

## 2016-08-09 VITALS — BP 104/70 | HR 75 | Temp 99.0°F | Resp 19 | Ht 63.0 in

## 2016-08-09 DIAGNOSIS — D500 Iron deficiency anemia secondary to blood loss (chronic): Secondary | ICD-10-CM

## 2016-08-09 DIAGNOSIS — R093 Abnormal sputum: Secondary | ICD-10-CM

## 2016-08-09 DIAGNOSIS — R05 Cough: Secondary | ICD-10-CM | POA: Insufficient documentation

## 2016-08-09 DIAGNOSIS — J40 Bronchitis, not specified as acute or chronic: Secondary | ICD-10-CM

## 2016-08-09 DIAGNOSIS — G933 Postviral fatigue syndrome: Secondary | ICD-10-CM | POA: Diagnosis not present

## 2016-08-09 LAB — CBC WITH DIFFERENTIAL/PLATELET
BASO%: 0.4 % (ref 0.0–2.0)
Basophils Absolute: 0 10*3/uL (ref 0.0–0.1)
EOS%: 1.2 % (ref 0.0–7.0)
Eosinophils Absolute: 0.1 10*3/uL (ref 0.0–0.5)
HEMATOCRIT: 36 % (ref 34.8–46.6)
HEMOGLOBIN: 12.2 g/dL (ref 11.6–15.9)
LYMPH%: 14.3 % (ref 14.0–49.7)
MCH: 30.6 pg (ref 25.1–34.0)
MCHC: 34 g/dL (ref 31.5–36.0)
MCV: 90.2 fL (ref 79.5–101.0)
MONO#: 1.1 10*3/uL — ABNORMAL HIGH (ref 0.1–0.9)
MONO%: 11.3 % (ref 0.0–14.0)
NEUT#: 7.1 10*3/uL — ABNORMAL HIGH (ref 1.5–6.5)
NEUT%: 72.8 % (ref 38.4–76.8)
PLATELETS: 300 10*3/uL (ref 145–400)
RBC: 3.99 10*6/uL (ref 3.70–5.45)
RDW: 13.2 % (ref 11.2–14.5)
WBC: 9.7 10*3/uL (ref 3.9–10.3)
lymph#: 1.4 10*3/uL (ref 0.9–3.3)

## 2016-08-09 MED ORDER — AZITHROMYCIN 250 MG PO TABS: ORAL_TABLET | ORAL | 0 refills | Status: DC

## 2016-08-09 MED FILL — AZITHROMYCIN 250 MG TABLET: 250 | 5 days supply | Qty: 6 | Fill #0

## 2016-08-09 NOTE — Progress Notes (Signed)
Transported via w/c to Radiology for CXR. Pt to return to [facility] after.   Pt returned to [facility] after CXR completed. Selena Lesser, NP aware and spoke with pt.  Discharged home

## 2016-08-09 NOTE — Telephone Encounter (Signed)
Scheduled appt per sch msg [facility]. Patient waiting in the lobby.

## 2016-08-10 DIAGNOSIS — J40 Bronchitis, not specified as acute or chronic: Secondary | ICD-10-CM | POA: Insufficient documentation

## 2016-08-10 NOTE — Progress Notes (Signed)
SYMPTOM MANAGEMENT CLINIC    Chief Complaint:   Bronchitis  HPI:  [name] 66 y.o. female diagnosed with .  Iron deficiency anemia.  [employer] on an as-needed basis.    No history exists.    Review of Systems  Constitutional: Positive for chills, fever and malaise/fatigue.  Respiratory: Positive for cough and sputum production.   All other systems reviewed and are negative.   Past Medical History:  Diagnosis Date  . Allergic rhinitis   . Anemia, iron deficiency 05/03/2011   recieves iron infusions as well as periodic red blood cell transfusions  . Anxiety   . Colon polyps   . Diabetes mellitus without complication ([place])   . Difficulty sleeping    takes trazadone for sleep  . Diverticulosis   . GERD (gastroesophageal reflux disease)   . GI bleeding    Has had bleeding from small bowel AVMs as well as from colon diverticulosis.  . Hepatitis C    Began Harvoni early 82016. completion date late 02/2015.   Marland Kitchen Hypertension   . Hypokalemic alkalosis   . PCT (porphyria cutanea tarda) ([place])   . Peripheral vascular disease ([place])   . Stroke [facility]) 2010   no residual problems    Past Surgical History:  Procedure Laterality Date  . ABDOMINAL HYSTERECTOMY    . APPLICATION OF WOUND VAC N/A 06/21/2014   Procedure: APPLICATION OF WOUND VAC;  Surgeon: Coralie Keens, MD;  Location: [place];  Service: General;  Laterality: N/A;  . CARPAL TUNNEL RELEASE     rt hand  . ENTEROSCOPY N/A 12/04/2012   Procedure: ENTEROSCOPY;  Surgeon: Beryle Beams, MD;  Location: [facility];  Service: Endoscopy;  Laterality: N/A;  . ENTEROSCOPY N/A 12/18/2012   Procedure: ENTEROSCOPY;  Surgeon: Beryle Beams, MD;  [facility]: [facility];  Service: Endoscopy;  Laterality: N/A;  . ENTEROSCOPY N/A 02/25/2015   Procedure: ENTEROSCOPY;  Surgeon: Inda Castle, MD;  Location: [facility] ENDOSCOPY;  Service: Endoscopy;  Laterality: N/A;  . ESOPHAGOGASTRODUODENOSCOPY  05/05/2011   Procedure:  ESOPHAGOGASTRODUODENOSCOPY (EGD);  Surgeon: Zenovia Jarred, MD;  Location: [facility];  Service: Gastroenterology;  Laterality: N/A;  Dr. Hilarie Fredrickson will do procedure for Dr. Benson Norway Saturday.  . ESOPHAGOGASTRODUODENOSCOPY  05/08/2011   Procedure: ESOPHAGOGASTRODUODENOSCOPY (EGD);  Surgeon: Beryle Beams;  [facility];  Service: Endoscopy;  Laterality: N/A;  . ESOPHAGOGASTRODUODENOSCOPY  06/07/2011   Procedure: ESOPHAGOGASTRODUODENOSCOPY (EGD);  Surgeon: Beryle Beams, MD;  Location: [facility];  Service: Endoscopy;  Laterality: N/A;  . ESOPHAGOGASTRODUODENOSCOPY  12/20/2011   Procedure: ESOPHAGOGASTRODUODENOSCOPY (EGD);  Surgeon: Beryle Beams, MD;  Location: [facility];  Service: Endoscopy;  Laterality: N/A;  . EYE SURGERY Bilateral    cataracts  . FLEXIBLE SIGMOIDOSCOPY  12/21/2011   Procedure: FLEXIBLE SIGMOIDOSCOPY;  Surgeon: Beryle Beams, MD;  [facility];  Service: Endoscopy;  Laterality: N/A;  . HOT HEMOSTASIS  06/07/2011   Procedure: HOT HEMOSTASIS (ARGON PLASMA COAGULATION/BICAP);  Surgeon: Beryle Beams, MD;  Location: [facility];  Service: Endoscopy;  Laterality: N/A;  . INCISIONAL HERNIA REPAIR N/A 06/01/2014   Procedure: ATTEMPTED LAPAROSCOPIC AND OPEN INCISIONAL HERNIA REPAIR WITH MESH;  Surgeon: Coralie Keens, MD;  Location: [place];  Service: General;  Laterality: N/A;  . INSERTION OF MESH N/A 06/01/2014   Procedure: INSERTION OF MESH;  Surgeon: Coralie Keens, MD;  Location: [place];  Service: General;  Laterality: N/A;  . LAPAROTOMY  02/16/2012   Procedure: EXPLORATORY LAPAROTOMY;  Surgeon: Darene Lamer  Hoxworth, MD;  Location: [facility];  Service: General;  Laterality: N/A;  oversewing of anastomotic leak and rigid sigmoidoscopy  . LAPAROTOMY N/A 06/21/2014   Procedure: ABDOMINAL WOUND EXPLORATION;  Surgeon: Coralie Keens, MD;  Location: [place];  Service: General;  Laterality: N/A;  . LEG AMPUTATION ABOVE KNEE    . PARTIAL COLECTOMY  02/15/2012   Procedure:  PARTIAL COLECTOMY;  Surgeon: Harl Bowie, MD;  Location: [facility];  Service: General;  Laterality: N/A;  . TONSILLECTOMY      has Essential hypertension, benign; GERD; CARPAL TUNNEL RELEASE, HX OF; History of hepatitis C; History of hernia repair; Phantom limb pain; Well controlled type 2 diabetes mellitus with peripheral neuropathy ([place]); Depression; History of porphyria; Vitamin D deficiency; Iron deficiency anemia due to chronic blood loss; Arteriovenous malformation of duodenum; GI hemorrhage; Polysubstance abuse; Hepatic cirrhosis ([place]); Gout involving toe of right foot; and Bronchitis on her problem list.    is allergic to ciprofloxacin; morphine and related; penicillins; codeine; lisinopril; and feraheme [ferumoxytol].  Allergies as of 08/09/2016      Reactions   Ciprofloxacin Hives, Swelling   Morphine And Related Other (See Comments)   Makes pt sad and paranoid   Penicillins Hives, Swelling   Has patient had a PCN reaction causing immediate rash, facial/tongue/throat swelling, SOB or lightheadedness with hypotension:YES Has patient had a PCN reaction causing severe rash involving mucus membranes or skin necrosis:UNSURE Has patient had a PCN reaction that required hospitalization:YES Has patient had a PCN reaction occurring within the last 10 years:No If all of the above answers are "NO", then may proceed with Cephalosporin use.   Codeine Hives, Swelling   Lisinopril Cough   New onset dry cough after starting lisinopril. Resolved upon switching to losartan.   Feraheme [ferumoxytol] Itching   Tolerated Feraheme 6/26 & 7/21 pre-medications prior to medication      Medication List       Accurate as of 08/09/16 11:59 PM. Always use your most recent med list.          aspirin EC 81 MG tablet Take 1 tablet (81 mg total) by mouth daily.   atenolol 50 MG tablet Commonly known as:  TENORMIN TAKE 1 TABLET BY MOUTH EVERY MORNING   azithromycin 250 MG tablet Commonly known  as:  ZITHROMAX Z-PAK Z pak: take as directed.   cetirizine 10 MG tablet Commonly known as:  ZYRTEC TAKE 1 TABLET BY MOUTH DAILY   cholecalciferol 1000 units tablet Commonly known as:  VITAMIN D TAKE ONE TABLET BY MOUTH DAILY.   colchicine 0.6 MG tablet Take 1 tablet (0.6 mg total) by mouth 2 (two) times daily.   ferrous sulfate 325 (65 FE) MG EC tablet Take 1 tablet (325 mg total) by mouth daily after breakfast.   FLUoxetine 40 MG capsule Commonly known as:  PROZAC TAKE 1 CAPSULE BY MOUTH DAILY   gabapentin 400 MG capsule Commonly known as:  NEURONTIN TAKE 2 CAPSULES BY MOUTH THREE TIMES A DAY   hydrochlorothiazide 25 MG tablet Commonly known as:  HYDRODIURIL TAKE 1 TABLET BY MOUTH EVERY MORNING   losartan 50 MG tablet Commonly known as:  COZAAR TAKE 1 TABLET BY MOUTH EVERY DAY   omeprazole 20 MG capsule Commonly known as:  PRILOSEC TAKE 1 CAPSULE BY MOUTH EVERY DAY **STOP NEXIUM**   potassium chloride 20 MEQ/15ML (10%) Soln TAKE 15ML BY MOUTH EVERY DAY   traMADol 50 MG tablet Commonly known as:  ULTRAM Take 50 mg by mouth  every 6 (six) hours as needed (for pain.).   traZODone 50 MG tablet Commonly known as:  DESYREL TAKE 1 TABLET BY MOUTH EVERY NIGHT AT BEDTIME   vitamin C 500 MG tablet Commonly known as:  ASCORBIC ACID Take 500 mg by mouth every morning.        PHYSICAL EXAMINATION  Oncology Vitals 08/09/2016 07/04/2016  Height 160 cm 160 cm  Weight - 96.072 kg  Weight (lbs) - 211 lbs 13 oz  BMI (kg/m2) - 37.52 kg/m2  Temp 99 97.9  Pulse 75 73  Resp 19 -  Resp (Historical as of 12/27/11) - -  SpO2 94 99  BSA (m2) - 2.07 m2   BP Readings from Last 2 Encounters:  08/09/16 104/70  07/04/16 (!) 179/99    Physical Exam  Constitutional: She is oriented to person, place, and time and well-developed, well-nourished, and in no distress.  HENT:  Head: Normocephalic and atraumatic.  Mouth/Throat: Oropharynx is clear and moist.  Eyes: Conjunctivae and  EOM are normal. Pupils are equal, round, and reactive to light. Right eye exhibits no discharge. Left eye exhibits no discharge. No scleral icterus.  Neck: Normal range of motion. Neck supple. No JVD present. No tracheal deviation present. No thyromegaly present.  Cardiovascular: Normal rate, regular rhythm, normal heart sounds and intact distal pulses.   Pulmonary/Chest: Effort normal and breath sounds normal. No respiratory distress. She has no wheezes. She has no rales. She exhibits no tenderness.  No cough, no wheezing, no respiratory distress noted.  Abdominal: Soft. Bowel sounds are normal. She exhibits no distension and no mass. There is no tenderness. There is no rebound and no guarding.  Musculoskeletal: Normal range of motion. She exhibits no edema or tenderness.  Lymphadenopathy:    She has no cervical adenopathy.  Neurological: She is alert and oriented to person, place, and time. Gait normal.  Skin: Skin is warm and dry. No rash noted. No erythema. No pallor.  Psychiatric: Affect normal.  Nursing note and vitals reviewed.   LABORATORY DATA:. Appointment on 08/09/2016  Component Date Value Ref Range Status  . WBC 08/09/2016 9.7  3.9 - 10.3 10e3/uL Final  . NEUT# 08/09/2016 7.1* 1.5 - 6.5 10e3/uL Final  . HGB 08/09/2016 12.2  11.6 - 15.9 g/dL Final  . HCT 08/09/2016 36.0  34.8 - 46.6 % Final  . Platelets 08/09/2016 300  145 - 400 10e3/uL Final  . MCV 08/09/2016 90.2  79.5 - 101.0 fL Final  . MCH 08/09/2016 30.6  25.1 - 34.0 pg Final  . MCHC 08/09/2016 34.0  31.5 - 36.0 g/dL Final  . RBC 08/09/2016 3.99  3.70 - 5.45 10e6/uL Final  . RDW 08/09/2016 13.2  11.2 - 14.5 % Final  . lymph# 08/09/2016 1.4  0.9 - 3.3 10e3/uL Final  . MONO# 08/09/2016 1.1* 0.1 - 0.9 10e3/uL Final  . Eosinophils Absolute 08/09/2016 0.1  0.0 - 0.5 10e3/uL Final  . Basophils Absolute 08/09/2016 0.0  0.0 - 0.1 10e3/uL Final  . NEUT% 08/09/2016 72.8  38.4 - 76.8 % Final  . LYMPH% 08/09/2016 14.3  14.0 -  49.7 % Final  . MONO% 08/09/2016 11.3  0.0 - 14.0 % Final  . EOS% 08/09/2016 1.2  0.0 - 7.0 % Final  . BASO% 08/09/2016 0.4  0.0 - 2.0 % Final    RADIOGRAPHIC STUDIES: Dg Chest 2 View  Result Date: 08/09/2016 CLINICAL DATA:  Productive cough for 1 day EXAM: CHEST  2 VIEW COMPARISON:  09/23/2015 FINDINGS:  The heart size and mediastinal contours are within normal limits. Both lungs are clear. The visualized skeletal structures are unremarkable. IMPRESSION: No active cardiopulmonary disease. Electronically Signed   By: Inez Catalina M.D.   On: 08/09/2016 14:02    ASSESSMENT/PLAN:    Iron deficiency anemia due to chronic blood loss   Patient has history of iron deficiency anemia secondary to her previously diagnosed AVM Patient states that she had one episode of questionable blood in her stool last week; but no other episodes.  At this time.  She receives Feraheme on an as-needed basis.  Hemoglobin was stable at 12 today.  Patient is scheduled to return on05/11/2016 for follow-up visit.  She knows to call in the interim with any new worries or concerns whatsoever.  Bronchitis   Patient reports several day history of a congested cough with some questionable yellow secretions.  She also had one episode of spitting some blood out of her mouth.  She states that she now realizes that the blood she spit out was from an ulcer.  She had underneath her tongue that has completely resolved at this point.  She  States she's had intermittent, low-grade fever only.  Exam today reveals lungs clear bilaterally with no cough or wheeze.  Chest x-ray obtained today revealed no acute symptoms.  Patient appears well and nontoxic.  Vital signs were stable with the exception of a low-grade temperature of 99.0.  Patient will be prescribed a Z-Pak for treatment of any mild bronchitis symptoms.  She was also encouraged to follow up with her primary care physician for any worsening symptoms whatsoever.  She may also go  directly to the emergency department for increased severity of symptoms as well.   Patient has an appointment with her .  Primary care provider on Monday, 08/13/2016; and has an appointment with her gastroenterologist in less than 2 weeks as well.   Patient stated understanding of all instructions; and was in agreement with this plan of care. The patient knows to call the clinic with any problems, questions or concerns.   Total time spent with patient was 25 minutes;  with greater than 75 percent of that time spent in face to face counseling regarding patient's symptoms,  and coordination of care and follow up.  Disclaimer:This dictation was prepared with Dragon/digital dictation along with [employer]. Any transcriptional errors that result from this process are unintentional.  Drue Second, NP 08/10/2016

## 2016-08-10 NOTE — Assessment & Plan Note (Addendum)
Patient reports several day history of a congested cough with some questionable yellow secretions.  She also had one episode of spitting some blood out of her mouth.  She states that she now realizes that the blood she spit out was from an ulcer.  She had underneath her tongue that has completely resolved at this point.  She  States she's had intermittent, low-grade fever only.  Exam today reveals lungs clear bilaterally with no cough or wheeze.  Chest x-ray obtained today revealed no acute symptoms.  Patient appears well and nontoxic.  Vital signs were stable with the exception of a low-grade temperature of 99.0.  Patient will be prescribed a Z-Pak for treatment of any mild bronchitis symptoms.  She was also encouraged to follow up with her primary care physician for any worsening symptoms whatsoever.  She may also go directly to the emergency department for increased severity of symptoms as well.   Patient has an appointment with her .  Primary care provider on Monday, 08/13/2016; and has an appointment with her gastroenterologist in less than 2 weeks as well.

## 2016-08-10 NOTE — Assessment & Plan Note (Signed)
Patient has history of iron deficiency anemia secondary to her previously diagnosed AVM Patient states that she had one episode of questionable blood in her stool last week; but no other episodes.  At this time.  She receives Feraheme on an as-needed basis.  Hemoglobin was stable at 12 today.  Patient is scheduled to return on05/11/2016 for follow-up visit.  She knows to call in the interim with any new worries or concerns whatsoever.

## 2016-08-11 ENCOUNTER — Telehealth: Payer: Self-pay | Admitting: Hematology

## 2016-08-11 NOTE — Telephone Encounter (Signed)
Not able to reach patient re 4/2 lab at home phone. Left message re 4/2 lab at mobile number.

## 2016-08-13 ENCOUNTER — Other Ambulatory Visit: Payer: Medicare Other

## 2016-08-13 NOTE — Telephone Encounter (Signed)
Mailed April/May schedule.

## 2016-08-15 ENCOUNTER — Other Ambulatory Visit: Payer: Self-pay | Admitting: Internal Medicine

## 2016-08-17 ENCOUNTER — Other Ambulatory Visit: Payer: Self-pay | Admitting: Internal Medicine

## 2016-08-17 ENCOUNTER — Encounter (HOSPITAL_COMMUNITY): Payer: Self-pay | Admitting: Emergency Medicine

## 2016-08-17 ENCOUNTER — Inpatient Hospital Stay (HOSPITAL_COMMUNITY)
Admission: EM | Admit: 2016-08-17 | Discharge: 2016-08-21 | DRG: 637 | Disposition: A | Discharge status: Home Health | Payer: Medicare Other | Attending: Internal Medicine | Admitting: Internal Medicine

## 2016-08-17 ENCOUNTER — Emergency Department (HOSPITAL_COMMUNITY): Payer: Medicare Other

## 2016-08-17 DIAGNOSIS — E861 Hypovolemia: Secondary | ICD-10-CM | POA: Diagnosis present

## 2016-08-17 DIAGNOSIS — M79609 Pain in unspecified limb: Secondary | ICD-10-CM | POA: Diagnosis not present

## 2016-08-17 DIAGNOSIS — Z8673 Personal history of transient ischemic attack (TIA), and cerebral infarction without residual deficits: Secondary | ICD-10-CM | POA: Diagnosis not present

## 2016-08-17 DIAGNOSIS — K5730 Diverticulosis of large intestine without perforation or abscess without bleeding: Secondary | ICD-10-CM | POA: Diagnosis present

## 2016-08-17 DIAGNOSIS — E1165 Type 2 diabetes mellitus with hyperglycemia: Secondary | ICD-10-CM | POA: Diagnosis not present

## 2016-08-17 DIAGNOSIS — G479 Sleep disorder, unspecified: Secondary | ICD-10-CM | POA: Diagnosis not present

## 2016-08-17 DIAGNOSIS — K7460 Unspecified cirrhosis of liver: Secondary | ICD-10-CM | POA: Diagnosis not present

## 2016-08-17 DIAGNOSIS — M109 Gout, unspecified: Secondary | ICD-10-CM | POA: Diagnosis present

## 2016-08-17 DIAGNOSIS — I10 Essential (primary) hypertension: Secondary | ICD-10-CM | POA: Diagnosis not present

## 2016-08-17 DIAGNOSIS — J9811 Atelectasis: Secondary | ICD-10-CM | POA: Diagnosis not present

## 2016-08-17 DIAGNOSIS — Z881 Allergy status to other antibiotic agents status: Secondary | ICD-10-CM

## 2016-08-17 DIAGNOSIS — Z7984 Long term (current) use of oral hypoglycemic drugs: Secondary | ICD-10-CM | POA: Diagnosis not present

## 2016-08-17 DIAGNOSIS — Z880 Allergy status to penicillin: Secondary | ICD-10-CM

## 2016-08-17 DIAGNOSIS — E669 Obesity, unspecified: Secondary | ICD-10-CM | POA: Diagnosis present

## 2016-08-17 DIAGNOSIS — F419 Anxiety disorder, unspecified: Secondary | ICD-10-CM | POA: Diagnosis present

## 2016-08-17 DIAGNOSIS — Z888 Allergy status to other drugs, medicaments and biological substances status: Secondary | ICD-10-CM

## 2016-08-17 DIAGNOSIS — Z9049 Acquired absence of other specified parts of digestive tract: Secondary | ICD-10-CM | POA: Diagnosis not present

## 2016-08-17 DIAGNOSIS — M1711 Unilateral primary osteoarthritis, right knee: Secondary | ICD-10-CM | POA: Diagnosis not present

## 2016-08-17 DIAGNOSIS — Z90710 Acquired absence of both cervix and uterus: Secondary | ICD-10-CM

## 2016-08-17 DIAGNOSIS — Z885 Allergy status to narcotic agent status: Secondary | ICD-10-CM

## 2016-08-17 DIAGNOSIS — D500 Iron deficiency anemia secondary to blood loss (chronic): Secondary | ICD-10-CM | POA: Diagnosis not present

## 2016-08-17 DIAGNOSIS — I129 Hypertensive chronic kidney disease with stage 1 through stage 4 chronic kidney disease, or unspecified chronic kidney disease: Secondary | ICD-10-CM | POA: Diagnosis present

## 2016-08-17 DIAGNOSIS — N289 Disorder of kidney and ureter, unspecified: Secondary | ICD-10-CM

## 2016-08-17 DIAGNOSIS — G9340 Encephalopathy, unspecified: Secondary | ICD-10-CM | POA: Diagnosis not present

## 2016-08-17 DIAGNOSIS — E119 Type 2 diabetes mellitus without complications: Secondary | ICD-10-CM

## 2016-08-17 DIAGNOSIS — N179 Acute kidney failure, unspecified: Secondary | ICD-10-CM | POA: Diagnosis present

## 2016-08-17 DIAGNOSIS — N182 Chronic kidney disease, stage 2 (mild): Secondary | ICD-10-CM | POA: Diagnosis not present

## 2016-08-17 DIAGNOSIS — M25561 Pain in right knee: Secondary | ICD-10-CM | POA: Diagnosis not present

## 2016-08-17 DIAGNOSIS — E1142 Type 2 diabetes mellitus with diabetic polyneuropathy: Secondary | ICD-10-CM | POA: Diagnosis present

## 2016-08-17 DIAGNOSIS — Z8249 Family history of ischemic heart disease and other diseases of the circulatory system: Secondary | ICD-10-CM

## 2016-08-17 DIAGNOSIS — Z6835 Body mass index (BMI) 35.0-35.9, adult: Secondary | ICD-10-CM

## 2016-08-17 DIAGNOSIS — E1101 Type 2 diabetes mellitus with hyperosmolarity with coma: Secondary | ICD-10-CM | POA: Diagnosis not present

## 2016-08-17 DIAGNOSIS — E1122 Type 2 diabetes mellitus with diabetic chronic kidney disease: Secondary | ICD-10-CM | POA: Diagnosis present

## 2016-08-17 DIAGNOSIS — R4182 Altered mental status, unspecified: Secondary | ICD-10-CM | POA: Diagnosis not present

## 2016-08-17 DIAGNOSIS — Z89512 Acquired absence of left leg below knee: Secondary | ICD-10-CM

## 2016-08-17 DIAGNOSIS — Z79899 Other long term (current) drug therapy: Secondary | ICD-10-CM

## 2016-08-17 DIAGNOSIS — F17210 Nicotine dependence, cigarettes, uncomplicated: Secondary | ICD-10-CM | POA: Diagnosis present

## 2016-08-17 DIAGNOSIS — K219 Gastro-esophageal reflux disease without esophagitis: Secondary | ICD-10-CM | POA: Diagnosis present

## 2016-08-17 DIAGNOSIS — Z1231 Encounter for screening mammogram for malignant neoplasm of breast: Secondary | ICD-10-CM

## 2016-08-17 DIAGNOSIS — E875 Hyperkalemia: Secondary | ICD-10-CM

## 2016-08-17 DIAGNOSIS — I959 Hypotension, unspecified: Secondary | ICD-10-CM | POA: Diagnosis not present

## 2016-08-17 DIAGNOSIS — E1100 Type 2 diabetes mellitus with hyperosmolarity without nonketotic hyperglycemic-hyperosmolar coma (NKHHC): Secondary | ICD-10-CM | POA: Diagnosis not present

## 2016-08-17 DIAGNOSIS — R140 Abdominal distension (gaseous): Secondary | ICD-10-CM | POA: Diagnosis not present

## 2016-08-17 DIAGNOSIS — E1140 Type 2 diabetes mellitus with diabetic neuropathy, unspecified: Secondary | ICD-10-CM | POA: Diagnosis present

## 2016-08-17 DIAGNOSIS — E1151 Type 2 diabetes mellitus with diabetic peripheral angiopathy without gangrene: Secondary | ICD-10-CM | POA: Diagnosis present

## 2016-08-17 DIAGNOSIS — Z7982 Long term (current) use of aspirin: Secondary | ICD-10-CM

## 2016-08-17 DIAGNOSIS — R739 Hyperglycemia, unspecified: Secondary | ICD-10-CM

## 2016-08-17 DIAGNOSIS — I82409 Acute embolism and thrombosis of unspecified deep veins of unspecified lower extremity: Secondary | ICD-10-CM

## 2016-08-17 DIAGNOSIS — K5900 Constipation, unspecified: Secondary | ICD-10-CM

## 2016-08-17 LAB — URINALYSIS, ROUTINE W REFLEX MICROSCOPIC
BILIRUBIN URINE: NEGATIVE
KETONES UR: 5 mg/dL — AB
LEUKOCYTES UA: NEGATIVE
NITRITE: NEGATIVE
PH: 5 (ref 5.0–8.0)
Protein, ur: 30 mg/dL — AB
SPECIFIC GRAVITY, URINE: 1.022 (ref 1.005–1.030)

## 2016-08-17 LAB — COMPREHENSIVE METABOLIC PANEL
ALK PHOS: 97 U/L (ref 38–126)
ALT: 15 U/L (ref 14–54)
AST: 17 U/L (ref 15–41)
Albumin: 4.3 g/dL (ref 3.5–5.0)
Anion gap: 16 — ABNORMAL HIGH (ref 5–15)
BUN: 58 mg/dL — ABNORMAL HIGH (ref 6–20)
CALCIUM: 9.6 mg/dL (ref 8.9–10.3)
CO2: 23 mmol/L (ref 22–32)
CREATININE: 2.38 mg/dL — AB (ref 0.44–1.00)
Chloride: 76 mmol/L — ABNORMAL LOW (ref 101–111)
GFR, EST AFRICAN AMERICAN: 24 mL/min — AB (ref >60)
GFR, EST NON AFRICAN AMERICAN: 21 mL/min — AB (ref >60)
Glucose, Bld: 1080 mg/dL (ref 65–99)
Potassium: 5.8 mmol/L — ABNORMAL HIGH (ref 3.5–5.1)
Sodium: 115 mmol/L — CL (ref 135–145)
Total Bilirubin: 0.7 mg/dL (ref 0.3–1.2)
Total Protein: 8.7 g/dL — ABNORMAL HIGH (ref 6.5–8.1)

## 2016-08-17 LAB — BASIC METABOLIC PANEL
Anion gap: 12 (ref 5–15)
BUN: 53 mg/dL — AB (ref 6–20)
CO2: 23 mmol/L (ref 22–32)
CREATININE: 2.13 mg/dL — AB (ref 0.44–1.00)
Calcium: 9.2 mg/dL (ref 8.9–10.3)
Chloride: 87 mmol/L — ABNORMAL LOW (ref 101–111)
GFR calc Af Amer: 27 mL/min — ABNORMAL LOW (ref >60)
GFR, EST NON AFRICAN AMERICAN: 24 mL/min — AB (ref >60)
Glucose, Bld: 813 mg/dL (ref 65–99)
Potassium: 4.7 mmol/L (ref 3.5–5.1)
SODIUM: 122 mmol/L — AB (ref 135–145)

## 2016-08-17 LAB — CBC WITH DIFFERENTIAL/PLATELET
BASOS ABS: 0 10*3/uL (ref 0.0–0.1)
BASOS PCT: 0 %
EOS ABS: 0 10*3/uL (ref 0.0–0.7)
Eosinophils Relative: 0 %
HEMATOCRIT: 32.6 % — AB (ref 36.0–46.0)
Hemoglobin: 11.1 g/dL — ABNORMAL LOW (ref 12.0–15.0)
Lymphocytes Relative: 10 %
Lymphs Abs: 1.2 10*3/uL (ref 0.7–4.0)
MCH: 29.2 pg (ref 26.0–34.0)
MCHC: 34 g/dL (ref 30.0–36.0)
MCV: 85.8 fL (ref 78.0–100.0)
MONO ABS: 1.2 10*3/uL — AB (ref 0.1–1.0)
Monocytes Relative: 10 %
NEUTROS ABS: 9.8 10*3/uL — AB (ref 1.7–7.7)
NEUTROS PCT: 80 %
PLATELETS: 392 10*3/uL (ref 150–400)
RBC: 3.8 MIL/uL — ABNORMAL LOW (ref 3.87–5.11)
RDW: 13.1 % (ref 11.5–15.5)
WBC: 12.3 10*3/uL — ABNORMAL HIGH (ref 4.0–10.5)

## 2016-08-17 LAB — I-STAT CHEM 8, ED
BUN: 57 mg/dL — AB (ref 6–20)
CALCIUM ION: 1.05 mmol/L — AB (ref 1.15–1.40)
CHLORIDE: 81 mmol/L — AB (ref 101–111)
CREATININE: 2.1 mg/dL — AB (ref 0.44–1.00)
Glucose, Bld: >700 mg/dL (ref 65–99)
HCT: 37 % (ref 36.0–46.0)
HEMOGLOBIN: 12.6 g/dL (ref 12.0–15.0)
POTASSIUM: 5.9 mmol/L — AB (ref 3.5–5.1)
Sodium: 114 mmol/L — CL (ref 135–145)
TCO2: 27 mmol/L (ref 0–100)

## 2016-08-17 LAB — CBG MONITORING, ED: Glucose-Capillary: >600 mg/dL (ref 65–99)

## 2016-08-17 LAB — PROTIME-INR
INR: 1.05
PROTHROMBIN TIME: 13.8 s (ref 11.4–15.2)

## 2016-08-17 LAB — BLOOD GAS, VENOUS
ACID-BASE DEFICIT: 1.9 mmol/L (ref 0.0–2.0)
Bicarbonate: 23.1 mmol/L (ref 20.0–28.0)
DRAWN BY: 30157
O2 Saturation: 87.3 %
PATIENT TEMPERATURE: 98.6
PH VEN: 7.347 (ref 7.250–7.430)
pCO2, Ven: 43.4 mmHg — ABNORMAL LOW (ref 44.0–60.0)
pO2, Ven: 57.5 mmHg — ABNORMAL HIGH (ref 32.0–45.0)

## 2016-08-17 LAB — TROPONIN I: Troponin I: <0.03 ng/mL (ref <0.03)

## 2016-08-17 LAB — LACTIC ACID, PLASMA: LACTIC ACID, VENOUS: 2.2 mmol/L — AB (ref 0.5–1.9)

## 2016-08-17 LAB — AMMONIA: Ammonia: 26 umol/L (ref 9–35)

## 2016-08-17 LAB — PROCALCITONIN: Procalcitonin: 0.17 ng/mL

## 2016-08-17 LAB — GLUCOSE, CAPILLARY: GLUCOSE-CAPILLARY: 563 mg/dL — AB (ref 65–99)

## 2016-08-17 MED ORDER — BISACODYL 10 MG RE SUPP: 10.0000 mg | Freq: Every day | RECTAL | Status: DC | PRN

## 2016-08-17 MED ORDER — LORATADINE 10 MG PO TABS
10.0000 mg | ORAL_TABLET | Freq: Every day | ORAL | Status: DC
Start: 2016-08-18 — End: 2016-08-21
  Administered 2016-08-18 – 2016-08-21 (×4): 10 mg via ORAL
  Filled 2016-08-17 (×4): qty 1

## 2016-08-17 MED ORDER — SODIUM CHLORIDE 0.9 % IV BOLUS (SEPSIS)
1000.0000 mL | Freq: Once | INTRAVENOUS | Status: AC
Start: 2016-08-17 — End: 2016-08-18
  Administered 2016-08-17: 1000 mL via INTRAVENOUS

## 2016-08-17 MED ORDER — SODIUM CHLORIDE 0.9 % IV SOLN: INTRAVENOUS | Status: DC

## 2016-08-17 MED ORDER — SODIUM CHLORIDE 0.9% FLUSH
3.0000 mL | Freq: Two times a day (BID) | INTRAVENOUS | Status: DC
  Administered 2016-08-19 – 2016-08-21 (×3): 3 mL via INTRAVENOUS

## 2016-08-17 MED ORDER — GABAPENTIN 400 MG PO CAPS
800.0000 mg | ORAL_CAPSULE | Freq: Three times a day (TID) | ORAL | Status: DC
  Administered 2016-08-18 – 2016-08-21 (×10): 800 mg via ORAL
  Filled 2016-08-17 (×11): qty 2

## 2016-08-17 MED ORDER — INSULIN REGULAR HUMAN 100 UNIT/ML IJ SOLN
INTRAMUSCULAR | Status: DC
  Administered 2016-08-17: 5.4 [IU]/h via INTRAVENOUS
  Filled 2016-08-17: qty 2.5

## 2016-08-17 MED ORDER — ASPIRIN EC 81 MG PO TBEC
81.0000 mg | DELAYED_RELEASE_TABLET | Freq: Every day | ORAL | Status: DC
  Administered 2016-08-18 – 2016-08-21 (×4): 81 mg via ORAL
  Filled 2016-08-17 (×4): qty 1

## 2016-08-17 MED ORDER — ENOXAPARIN SODIUM 30 MG/0.3ML ~~LOC~~ SOLN
30.0000 mg | Freq: Every day | SUBCUTANEOUS | Status: DC
  Administered 2016-08-17: 30 mg via SUBCUTANEOUS
  Filled 2016-08-17: qty 0.3

## 2016-08-17 MED ORDER — ACETAMINOPHEN 325 MG PO TABS
650.0000 mg | ORAL_TABLET | Freq: Four times a day (QID) | ORAL | Status: DC | PRN
  Administered 2016-08-18: 650 mg via ORAL
  Filled 2016-08-17: qty 2

## 2016-08-17 MED ORDER — TRAZODONE HCL 50 MG PO TABS
50.0000 mg | ORAL_TABLET | Freq: Every evening | ORAL | Status: DC | PRN
  Filled 2016-08-17: qty 1

## 2016-08-17 MED ORDER — POLYETHYLENE GLYCOL 3350 17 G PO PACK
17.0000 g | PACK | Freq: Every day | ORAL | Status: DC | PRN
  Administered 2016-08-19: 17 g via ORAL
  Filled 2016-08-17: qty 1

## 2016-08-17 MED ORDER — FERROUS SULFATE 325 (65 FE) MG PO TABS
325.0000 mg | ORAL_TABLET | Freq: Every day | ORAL | Status: DC
  Administered 2016-08-18 – 2016-08-21 (×4): 325 mg via ORAL
  Filled 2016-08-17 (×4): qty 1

## 2016-08-17 MED ORDER — ALBUTEROL SULFATE (2.5 MG/3ML) 0.083% IN NEBU: 2.5000 mg | INHALATION_SOLUTION | RESPIRATORY_TRACT | Status: DC | PRN

## 2016-08-17 MED ORDER — DEXTROSE-NACL 5-0.45 % IV SOLN: INTRAVENOUS | Status: DC

## 2016-08-17 MED ORDER — SODIUM CHLORIDE 0.9 % IV SOLN
INTRAVENOUS | Status: DC
  Administered 2016-08-17 – 2016-08-18 (×2): via INTRAVENOUS
  Administered 2016-08-18: 125 mL/h via INTRAVENOUS
  Administered 2016-08-19 – 2016-08-20 (×2): via INTRAVENOUS

## 2016-08-17 MED ORDER — ACETAMINOPHEN 650 MG RE SUPP: 650.0000 mg | Freq: Four times a day (QID) | RECTAL | Status: DC | PRN

## 2016-08-17 MED ORDER — INSULIN REGULAR HUMAN 100 UNIT/ML IJ SOLN
INTRAMUSCULAR | Status: DC
  Filled 2016-08-17: qty 2.5

## 2016-08-17 NOTE — ED Provider Notes (Addendum)
[facility] DEPT Provider Note   CSN: [id number] Arrival date & time: 08/17/16  1816     History   Chief Complaint Chief Complaint  Patient presents with  . Altered Mental Status    Level 5 caveat due to altered mental status. HPI [name] is a 66 y.o. female.  HPI  Mumbling at home. Mildly confused but able to provide some history. Reviewed records and start on antibiotics around a week ago for URI type symptoms. Has been told she was diabetic in the past but had been off medicines because she was told she got better. Has had some urinary frequency.No abdominal pain. Patient's family member states she has been a little more confused recently. Initial CBG greater than 600.   Past Medical History:  Diagnosis Date  . Allergic rhinitis   . Anemia, iron deficiency 05/03/2011   recieves iron infusions as well as periodic red blood cell transfusions  . Anxiety   . Colon polyps   . Diabetes mellitus without complication ([place])   . Difficulty sleeping    takes trazadone for sleep  . Diverticulosis   . GERD (gastroesophageal reflux disease)   . GI bleeding    Has had bleeding from small bowel AVMs as well as from colon diverticulosis.  . Hepatitis C    Began [facility] early 82016. completion date late 02/2015.   Marland Kitchen Hypertension   . Hypokalemic alkalosis   . 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 Peripheral vascular disease ([place])   . Stroke [facility]) 2010   no residual problems    Patient Active Problem List   Diagnosis Date Noted  . AKI (acute kidney injury) ([place]) 08/17/2016  . Hypotension 08/17/2016  . Hyperosmolar non-ketotic state in patient with type 2 diabetes mellitus ([place]) 08/17/2016  . Acute encephalopathy 08/17/2016  . Bronchitis 08/10/2016  . Gout involving toe of right foot 02/28/2016  . Hepatic cirrhosis ([place]) 03/15/2015  . Polysubstance abuse 03/05/2015  . GI hemorrhage 03/02/2015  . Arteriovenous malformation of duodenum 02/25/2015  . Iron deficiency  anemia due to chronic blood loss 02/23/2015  . Vitamin D deficiency 12/02/2014  . History of porphyria 11/30/2014  . Depression 07/10/2013  . Well controlled type 2 diabetes mellitus with peripheral neuropathy ([place]) 06/07/2013  . History of hernia repair 08/18/2012  . Phantom limb pain 08/18/2012  . History of hepatitis C 08/28/2011  . Essential hypertension, benign 07/22/2008  . GERD 07/22/2008  . CARPAL TUNNEL RELEASE, HX OF 07/22/2008    Past Surgical History:  Procedure Laterality Date  . ABDOMINAL HYSTERECTOMY    . APPLICATION OF WOUND VAC N/A 06/21/2014   Procedure: APPLICATION OF WOUND VAC;  Surgeon: Coralie Keens, MD;  Location: [place];  Service: General;  Laterality: N/A;  . CARPAL TUNNEL RELEASE     rt hand  . ENTEROSCOPY N/A 12/04/2012   Procedure: ENTEROSCOPY;  Surgeon: Beryle Beams, MD;  Location: [facility];  Service: Endoscopy;  Laterality: N/A;  . ENTEROSCOPY N/A 12/18/2012   Procedure: ENTEROSCOPY;  Surgeon: Beryle Beams, MD;  Location: [facility];  Service: Endoscopy;  Laterality: N/A;  . ENTEROSCOPY N/A 02/25/2015   Procedure: ENTEROSCOPY;  Surgeon: Inda Castle, MD;  Location: [facility] ENDOSCOPY;  Service: Endoscopy;  Laterality: N/A;  . ESOPHAGOGASTRODUODENOSCOPY  05/05/2011   Procedure: ESOPHAGOGASTRODUODENOSCOPY (EGD);  Surgeon: Zenovia Jarred, MD;  Location: [facility];  Service: Gastroenterology;  Laterality: N/A;  Dr. Hilarie Fredrickson will do procedure for Dr. Benson Norway Saturday.  . ESOPHAGOGASTRODUODENOSCOPY  05/08/2011  Procedure: ESOPHAGOGASTRODUODENOSCOPY (EGD);  Surgeon: Beryle Beams;  [facility]: [facility];  Service: Endoscopy;  Laterality: N/A;  . ESOPHAGOGASTRODUODENOSCOPY  06/07/2011   Procedure: ESOPHAGOGASTRODUODENOSCOPY (EGD);  Surgeon: Beryle Beams, MD;  Location: [facility];  Service: Endoscopy;  Laterality: N/A;  . ESOPHAGOGASTRODUODENOSCOPY  12/20/2011   Procedure: ESOPHAGOGASTRODUODENOSCOPY (EGD);  Surgeon: Beryle Beams, MD;  Location: [facility]  ENDOSCOPY;  Service: Endoscopy;  Laterality: N/A;  . EYE SURGERY Bilateral    cataracts  . FLEXIBLE SIGMOIDOSCOPY  12/21/2011   Procedure: FLEXIBLE SIGMOIDOSCOPY;  Surgeon: Beryle Beams, MD;  Location: [facility];  Service: Endoscopy;  Laterality: N/A;  . HOT HEMOSTASIS  06/07/2011   Procedure: HOT HEMOSTASIS (ARGON PLASMA COAGULATION/BICAP);  Surgeon: Beryle Beams, MD;  Location: [facility];  Service: Endoscopy;  Laterality: N/A;  . INCISIONAL HERNIA REPAIR N/A 06/01/2014   Procedure: ATTEMPTED LAPAROSCOPIC AND OPEN INCISIONAL HERNIA REPAIR WITH MESH;  Surgeon: Coralie Keens, MD;  Location: [place];  Service: General;  Laterality: N/A;  . INSERTION OF MESH N/A 06/01/2014   Procedure: INSERTION OF MESH;  Surgeon: Coralie Keens, MD;  Location: [place];  Service: General;  Laterality: N/A;  . LAPAROTOMY  02/16/2012   Procedure: EXPLORATORY LAPAROTOMY;  Surgeon: Edward Jolly, MD;  Location: [facility];  Service: General;  Laterality: N/A;  oversewing of anastomotic leak and rigid sigmoidoscopy  . LAPAROTOMY N/A 06/21/2014   Procedure: ABDOMINAL WOUND EXPLORATION;  Surgeon: Coralie Keens, MD;  Location: [place];  Service: General;  Laterality: N/A;  . LEG AMPUTATION ABOVE KNEE    . PARTIAL COLECTOMY  02/15/2012   Procedure: PARTIAL COLECTOMY;  Surgeon: Harl Bowie, MD;  Location: [facility];  Service: General;  Laterality: N/A;  . TONSILLECTOMY      OB History    No data available       Home Medications    Prior to Admission medications   Medication Sig Start Date End Date Taking? Authorizing Provider  aspirin EC 81 MG tablet Take 1 tablet (81 mg total) by mouth daily. 03/04/15  Yes Burgess Estelle, MD  atenolol (TENORMIN) 50 MG tablet TAKE 1 TABLET BY MOUTH EVERY MORNING Patient taking differently: TAKE 50 MG BY MOUTH EVERY MORNING 04/27/16  Yes [name], MD  cetirizine (ZYRTEC) 10 MG tablet TAKE 1 TABLET BY MOUTH DAILY Patient taking differently: TAKE 10 MG BY MOUTH DAILY  03/29/16  Yes [name], MD  cholecalciferol (VITAMIN D) 1000 units tablet TAKE ONE TABLET BY MOUTH DAILY. Patient taking differently: TAKE 1000 UNITS BY MOUTH DAILY. 03/01/16  Yes [name], MD  ferrous sulfate 325 (65 FE) MG EC tablet Take 1 tablet (325 mg total) by mouth daily after breakfast. 07/02/16  Yes Truitt Merle, MD  FLUoxetine (PROZAC) 40 MG capsule TAKE 1 CAPSULE BY MOUTH DAILY Patient taking differently: TAKE 40 MG BY MOUTH DAILY 05/30/16  Yes [name], MD  gabapentin (NEURONTIN) 400 MG capsule TAKE 2 CAPSULES BY MOUTH THREE TIMES A DAY Patient taking differently: TAKE 800 MG BY MOUTH THREE TIMES A DAY 03/01/16  Yes [name], MD  hydrochlorothiazide (HYDRODIURIL) 25 MG tablet TAKE 1 TABLET BY MOUTH EVERY MORNING. Patient taking differently: TAKE 25 MG BY MOUTH EVERY MORNING. 08/16/16  Yes [name], MD  losartan (COZAAR) 50 MG tablet TAKE 1 TABLET BY MOUTH EVERY DAY Patient taking differently: TAKE 50 MG  BY MOUTH EVERY DAY 03/01/16  Yes [name], MD  omeprazole (PRILOSEC) 20 MG capsule TAKE 1 CAPSULE BY MOUTH EVERY DAY **  STOP NEXIUM** Patient taking differently: TAKE 20 MG BY MOUTH EVERY DAY **STOP NEXIUM** 05/30/16  Yes [name], MD  potassium chloride 20 MEQ/15ML (10%) SOLN TAKE 15ML BY MOUTH EVERY DAY 08/16/16  Yes [name], MD  traMADol (ULTRAM) 50 MG tablet Take 50 mg by mouth every 6 (six) hours as needed (for pain.).  07/21/15  Yes Historical Provider, MD  traZODone (DESYREL) 50 MG tablet TAKE 1 TABLET BY MOUTH EVERY NIGHT AT BEDTIME Patient taking differently: TAKE 50 MG BY MOUTH EVERY NIGHT AT BEDTIME 08/16/16  Yes [name], MD  vitamin C (ASCORBIC ACID) 500 MG tablet Take 500 mg by mouth every morning.   Yes Historical Provider, MD  azithromycin (ZITHROMAX Z-PAK) 250 MG tablet Z pak: take as directed. Patient not taking: Reported on 08/17/2016 08/09/16   Susanne Borders, NP  colchicine 0.6 MG tablet Take 1 tablet (0.6 mg total) by mouth 2  (two) times daily. Patient not taking: Reported on 08/09/2016 04/25/16 04/25/17  Juliet Rude, MD    Family History Family History  Problem Relation Age of Onset  . Cancer Father     unknown  . Hypertension Mother   . Diverticulitis Mother     Social History Social History  Substance Use Topics  . Smoking status: Current Every Day Smoker    Packs/day: 0.20    Years: 17.00    Types: Cigarettes  . Smokeless tobacco: Never Used     Comment: wants to quit.  Smoking less 1 pk per week.  1-2 cigs per day  . Alcohol use 0.0 oz/week     Comment: Occasionally     Allergies   Ciprofloxacin; Morphine and related; Penicillins; Codeine; Lisinopril; and Feraheme [ferumoxytol]   Review of Systems Review of Systems  Unable to perform ROS: Mental status change  HENT: Negative for congestion.   Respiratory: Positive for cough.   Endocrine: Positive for polydipsia, polyphagia and polyuria.  Genitourinary: Positive for frequency.     Physical Exam Updated Vital Signs BP 117/73 (BP Location: Right Arm)   Pulse 69   Temp 98.3 F (36.8 C) (Oral)   Resp 13   Ht 5\' 3"  (1.6 m)   Wt 180 lb 8.9 oz (81.9 kg)   SpO2 98%   BMI 31.98 kg/m   Physical Exam  Constitutional: She appears well-developed.  HENT:  Head: Normocephalic.  Mucous membranes are dry  Eyes: Pupils are equal, round, and reactive to light.  Neck: Neck supple.  Cardiovascular: Normal rate.   Pulmonary/Chest: Effort normal.  Abdominal: There is no tenderness.  Neurological: She is alert.  Awake to self and date but mildly confused to events.  Skin: Skin is warm.  Psychiatric: She has a normal mood and affect.     ED Treatments / Results  Labs (all labs ordered are listed, but only abnormal results are displayed) Labs Reviewed  COMPREHENSIVE METABOLIC PANEL - Abnormal; Notable for the following:       Result Value   Sodium 115 (*)    Potassium 5.8 (*)    Chloride 76 (*)    Glucose, Bld 1,080 (*)    BUN  58 (*)    Creatinine, Ser 2.38 (*)    Total Protein 8.7 (*)    GFR calc non Af Amer 21 (*)    GFR calc Af Amer 24 (*)    Anion gap 16 (*)    All other components within normal limits  BLOOD GAS, VENOUS - Abnormal; Notable for  the following:    pCO2, Ven 43.4 (*)    pO2, Ven 57.5 (*)    All other components within normal limits  CBC WITH DIFFERENTIAL/PLATELET - Abnormal; Notable for the following:    WBC 12.3 (*)    RBC 3.80 (*)    Hemoglobin 11.1 (*)    HCT 32.6 (*)    Neutro Abs 9.8 (*)    Monocytes Absolute 1.2 (*)    All other components within normal limits  URINALYSIS, ROUTINE W REFLEX MICROSCOPIC - Abnormal; Notable for the following:    Color, Urine STRAW (*)    Glucose, UA >=500 (*)    Hgb urine dipstick SMALL (*)    Ketones, ur 5 (*)    Protein, ur 30 (*)    Bacteria, UA RARE (*)    Squamous Epithelial / LPF 0-5 (*)    All other components within normal limits  BASIC METABOLIC PANEL - Abnormal; Notable for the following:    Sodium 122 (*)    Chloride 87 (*)    Glucose, Bld 813 (*)    BUN 53 (*)    Creatinine, Ser 2.13 (*)    GFR calc non Af Amer 24 (*)    GFR calc Af Amer 27 (*)    All other components within normal limits  LACTIC ACID, PLASMA - Abnormal; Notable for the following:    Lactic Acid, Venous 2.2 (*)    All other components within normal limits  GLUCOSE, CAPILLARY - Abnormal; Notable for the following:    Glucose-Capillary 563 (*)    All other components within normal limits  CBG MONITORING, ED - Abnormal; Notable for the following:    Glucose-Capillary >600 (*)    All other components within normal limits  I-STAT CHEM 8, ED - Abnormal; Notable for the following:    Sodium 114 (*)    Potassium 5.9 (*)    Chloride 81 (*)    BUN 57 (*)    Creatinine, Ser 2.10 (*)    Glucose, Bld >700 (*)    Calcium, Ion 1.05 (*)    All other components within normal limits  CBG MONITORING, ED - Abnormal; Notable for the following:    Glucose-Capillary >600  (*)    All other components within normal limits  CBG MONITORING, ED - Abnormal; Notable for the following:    Glucose-Capillary >600 (*)    All other components within normal limits  MRSA PCR SCREENING  CULTURE, BLOOD (ROUTINE X 2)  CULTURE, BLOOD (ROUTINE X 2)  TROPONIN I  TROPONIN I  PROCALCITONIN  AMMONIA  PROTIME-INR  TROPONIN I  BASIC METABOLIC PANEL  BASIC METABOLIC PANEL  TROPONIN I  MAGNESIUM  PHOSPHORUS  TSH  COMPREHENSIVE METABOLIC PANEL  CBC  HEMOGLOBIN A1C  HIV ANTIBODY (ROUTINE TESTING)  BASIC METABOLIC PANEL    EKG  EKG Interpretation  Date/Time:  Friday August 17 2016 18:33:32 EDT Ventricular Rate:  69 PR Interval:    QRS Duration: 93 QT Interval:  420 QTC Calculation: 450 R Axis:   37 Text Interpretation:  Sinus rhythm Abnormal R-wave progression, early transition Nonspecific T abnormalities, lateral leads Minimal ST elevation, anterior leads T waves peaked Confirmed by Alvino Chapel  MD, Minh Jasper [phone]) on 08/17/2016 6:43:44 PM       Radiology Dg Chest Portable 1 View  Result Date: 08/17/2016 CLINICAL DATA:  Progressive confusion over the last week. History of diabetes and hypertension. EXAM: PORTABLE CHEST 1 VIEW COMPARISON:  08/09/2016 and 09/15/2015. FINDINGS: 1847 hour.  The heart size and mediastinal contours are stable. There are lower lung volumes with increased subsegmental atelectasis at both lung bases. Left apical lucency is stable, consistent with emphysema. No confluent airspace opacity, pleural effusion or pneumothorax seen. The bones appear unchanged. Telemetry leads overlie the chest. IMPRESSION: Increased bibasilar atelectasis.  No other significant changes. Electronically Signed   By: Richardean Sale M.D.   On: 08/17/2016 18:56    Procedures Procedures (including critical care time)  Medications Ordered in ED Medications  0.9 %  sodium chloride infusion ( Intravenous New Bag/Given 08/17/16 2050)  traZODone (DESYREL) tablet 50 mg (not  administered)  ferrous sulfate tablet 325 mg (not administered)  loratadine (CLARITIN) tablet 10 mg (not administered)  gabapentin (NEURONTIN) capsule 800 mg (not administered)  aspirin EC tablet 81 mg (not administered)  insulin regular (NOVOLIN R,HUMULIN R) 250 Units in sodium chloride 0.9 % 250 mL (1 Units/mL) infusion (5 Units/hr Intravenous Rate/Dose Change 08/17/16 2315)  sodium chloride flush (NS) 0.9 % injection 3 mL (3 mLs Intravenous Not Given 08/17/16 2324)  acetaminophen (TYLENOL) tablet 650 mg (not administered)    Or  acetaminophen (TYLENOL) suppository 650 mg (not administered)  enoxaparin (LOVENOX) injection 30 mg (30 mg Subcutaneous Given 08/17/16 2326)  bisacodyl (DULCOLAX) suppository 10 mg (not administered)  polyethylene glycol (MIRALAX / GLYCOLAX) packet 17 g (not administered)  albuterol (PROVENTIL) (2.5 MG/3ML) 0.083% nebulizer solution 2.5 mg (not administered)  sodium chloride 0.9 % bolus 1,000 mL (1,000 mLs Intravenous Given 08/18/16 0029)  sodium chloride 0.9 % bolus 1,000 mL (1,000 mLs Intravenous New Bag/Given 08/17/16 1846)     Initial Impression / Assessment and Plan / ED Course  I have reviewed the triage vital signs and the nursing notes.  Pertinent labs & imaging results that were available during my care of the patient were reviewed by me and considered in my medical decision making (see chart for details).     Patient with altered mental status changes accounted the hyperosmolar nonketotic. Sugar 1000. Renal insufficiency. Mild hyperkalemia. Will admit to internal medicine to ICU. CRITICAL CARE Performed by: Mackie Pai Total critical care time: 30 minutes Critical care time was exclusive of separately billable procedures and treating other patients. Critical care was necessary to treat or prevent imminent or life-threatening deterioration. Critical care was time spent personally by me on the following activities: development of treatment plan  with patient and/or surrogate as well as nursing, discussions with consultants, evaluation of patient's response to treatment, examination of patient, obtaining history from patient or surrogate, ordering and performing treatments and interventions, ordering and review of laboratory studies, ordering and review of radiographic studies, pulse oximetry and re-evaluation of patient's condition.    Final Clinical Impressions(s) / ED Diagnoses   Final diagnoses:  Hyperglycemia  Hyperkalemia  Renal insufficiency    New Prescriptions Current Discharge Medication List       Davonna Belling, MD 08/18/16 [id number]    Davonna Belling, MD 08/18/16 [phone]

## 2016-08-17 NOTE — ED Triage Notes (Signed)
Per family patient has been progressively gotten more confused over a week now. Family found patient today at her home to only be mumbling about thanksgiving dinner. Patient alert to self, time and place. Patient not alert to situation.

## 2016-08-17 NOTE — ED Notes (Signed)
Glucose 813 and Lactic Acid 2.2.

## 2016-08-17 NOTE — H&P (Signed)
[name] MPN:[id number] DOB: [birth year] DOA: 08/17/2016     PCP: [name], MD   Outpatient Specialists: [facility] Patient coming from:    home Lives alone,         Chief Complaint: confusion  HPI: [name] is a 66 y.o. female with medical history significant of   Iron deficiency anemia, anxiety DM 2 GERD, small bowel AVMs with history of GI bleed , Hepatitis C with history of cirrhosis completed Harvoni in2016 , CVA sp Left BKA    Presented with Progressive confusion over past 1 week two-day family found her mumbling about things given dinner. Not oriented to situation but otherwise oriented to self. She did endorse some urinary frequency for the past 1 months. She has been loosing weight, blurry vision also for the past 1 month supposed to be seeing opthalmology for this.  but no chest pain or fevers or chills no shortness of breath.  10 days ago patient was having some bronchitis and was given Z-Pak for this  Regardinginent Chronic problems: She has known history of diabetes but been diet-controlled   IN ER:  Temp (24hrs), Avg:98.2 F (36.8 C), Min:98.2 F (36.8 C), Max:98.2 F (36.8 C)     RR 21 HR 95 BP trending down to 89/72 Na 114 K 5.9 BUN 57 CR 2.1 up from baseline of 1.2 in February BG 1080 bicarbonate 23 albumin 4.3 WBC 12.3 hemoglobin 11.1 VBG 7.347/43/57.5  Troponin less than 0.03 CXR showing increased bibasilar atelectasis  Following Medications were ordered in ER: Medications  dextrose 5 %-0.45 % sodium chloride infusion (not administered)  insulin regular (NOVOLIN R,HUMULIN R) 250 Units in sodium chloride 0.9 % 250 mL (1 Units/mL) infusion (5.4 Units/hr Intravenous New Bag/Given 08/17/16 2048)  0.9 %  sodium chloride infusion ( Intravenous New Bag/Given 08/17/16 2050)  sodium chloride 0.9 % bolus 1,000 mL (1,000 mLs Intravenous New Bag/Given 08/17/16 1846)     ER provider discussed case with: pCCM felt the patient is appropriate for  medicine service   Hospitalist was called for admission for Hyperosmolar nonketotic state with acute encephalopathy  Review of Systems:    Pertinent positives include:confusion frequency.  Constitutional:  No weight loss, night sweats, Fevers, chills, fatigue, weight loss  HEENT:  No headaches, Difficulty swallowing,Tooth/dental problems,Sore throat,  No sneezing, itching, ear ache, nasal congestion, post nasal drip,  Cardio-vascular:  No chest pain, Orthopnea, PND, anasarca, dizziness, palpitations.no Bilateral lower extremity swelling  GI:  No heartburn, indigestion, abdominal pain, nausea, vomiting, diarrhea, change in bowel habits, loss of appetite, melena, blood in stool, hematemesis Resp:  no shortness of breath at rest. No dyspnea on exertion, No excess mucus, no productive cough, No non-productive cough, No coughing up of blood.No change in color of mucus.No wheezing. Skin:  no rash or lesions. No jaundice GU:  no dysuria, change in color of urine, no urgency or  No straining to urinate.  No flank pain.  Musculoskeletal:  No joint pain or no joint swelling. No decreased range of motion. No back pain.  Psych:  No change in mood or affect. No depression or anxiety. No memory loss.  Neuro: no localizing neurological complaints, no tingling, no weakness, no double vision, no gait abnormality, no slurred speech,    As per HPI otherwise 10 point review of systems negative.   Past Medical History: Past Medical History:  Diagnosis Date  . Allergic rhinitis   . Anemia, iron deficiency 05/03/2011   recieves iron  infusions as well as periodic red blood cell transfusions  . Anxiety   . Colon polyps   . Diabetes mellitus without complication ([place])   . Difficulty sleeping    takes trazadone for sleep  . Diverticulosis   . GERD (gastroesophageal reflux disease)   . GI bleeding    Has had bleeding from small bowel AVMs as well as from colon diverticulosis.  . Hepatitis C     Began Harvoni early 82016. completion date late 02/2015.   Marland Kitchen Hypertension   . Hypokalemic alkalosis   . PCT (porphyria cutanea tarda) ([place])   . Peripheral vascular disease ([place])   . Stroke [facility]) 2010   no residual problems   Past Surgical History:  Procedure Laterality Date  . ABDOMINAL HYSTERECTOMY    . APPLICATION OF WOUND VAC N/A 06/21/2014   Procedure: APPLICATION OF WOUND VAC;  Surgeon: Coralie Keens, MD;  Location: [place];  Service: General;  Laterality: N/A;  . CARPAL TUNNEL RELEASE     rt hand  . ENTEROSCOPY N/A 12/04/2012   Procedure: ENTEROSCOPY;  Surgeon: Beryle Beams, MD;  Location: [facility];  Service: Endoscopy;  Laterality: N/A;  . ENTEROSCOPY N/A 12/18/2012   Procedure: ENTEROSCOPY;  Surgeon: Beryle Beams, MD;  Location: [facility];  Service: Endoscopy;  Laterality: N/A;  . ENTEROSCOPY N/A 02/25/2015   Procedure: ENTEROSCOPY;  Surgeon: Inda Castle, MD;  Location: [facility];  Service: Endoscopy;  Laterality: N/A;  . ESOPHAGOGASTRODUODENOSCOPY  05/05/2011   Procedure: ESOPHAGOGASTRODUODENOSCOPY (EGD);  Surgeon: Zenovia Jarred, MD;  Location: [facility];  Service: Gastroenterology;  Laterality: N/A;  Dr. Hilarie Fredrickson will do procedure for Dr. Benson Norway Saturday.  . ESOPHAGOGASTRODUODENOSCOPY  05/08/2011   Procedure: ESOPHAGOGASTRODUODENOSCOPY (EGD);  Surgeon: Beryle Beams;  [facility];  Service: Endoscopy;  Laterality: N/A;  . ESOPHAGOGASTRODUODENOSCOPY  06/07/2011   Procedure: ESOPHAGOGASTRODUODENOSCOPY (EGD);  Surgeon: Beryle Beams, MD;  Location: [facility];  Service: Endoscopy;  Laterality: N/A;  . ESOPHAGOGASTRODUODENOSCOPY  12/20/2011   Procedure: ESOPHAGOGASTRODUODENOSCOPY (EGD);  Surgeon: Beryle Beams, MD;  Location: [facility];  Service: Endoscopy;  Laterality: N/A;  . EYE SURGERY Bilateral    cataracts  . FLEXIBLE SIGMOIDOSCOPY  12/21/2011   Procedure: FLEXIBLE SIGMOIDOSCOPY;  Surgeon: Beryle Beams, MD;  [facility];  Service:  Endoscopy;  Laterality: N/A;  . HOT HEMOSTASIS  06/07/2011   Procedure: HOT HEMOSTASIS (ARGON PLASMA COAGULATION/BICAP);  Surgeon: Beryle Beams, MD;  Location: [facility];  Service: Endoscopy;  Laterality: N/A;  . INCISIONAL HERNIA REPAIR N/A 06/01/2014   Procedure: ATTEMPTED LAPAROSCOPIC AND OPEN INCISIONAL HERNIA REPAIR WITH MESH;  Surgeon: Coralie Keens, MD;  Location: [place];  Service: General;  Laterality: N/A;  . INSERTION OF MESH N/A 06/01/2014   Procedure: INSERTION OF MESH;  Surgeon: Coralie Keens, MD;  Location: [place];  Service: General;  Laterality: N/A;  . LAPAROTOMY  02/16/2012   Procedure: EXPLORATORY LAPAROTOMY;  Surgeon: Edward Jolly, MD;  Location: [facility];  Service: General;  Laterality: N/A;  oversewing of anastomotic leak and rigid sigmoidoscopy  . LAPAROTOMY N/A 06/21/2014   Procedure: ABDOMINAL WOUND EXPLORATION;  Surgeon: Coralie Keens, MD;  Location: [place];  Service: General;  Laterality: N/A;  . LEG AMPUTATION ABOVE KNEE    . PARTIAL COLECTOMY  02/15/2012   Procedure: PARTIAL COLECTOMY;  Surgeon: Harl Bowie, MD;  Location: [facility];  Service: General;  Laterality: N/A;  . TONSILLECTOMY       Social History:  Ambulatory with  prosthesis on left leg    reports that she has been smoking Cigarettes.  She has a 3.40 pack-year smoking history. She has never used smokeless tobacco. She reports that she drinks alcohol. She reports that she uses drugs, including Marijuana.  Allergies:   Allergies  Allergen Reactions  . Ciprofloxacin Hives and Swelling  . Morphine And Related Other (See Comments)    Makes pt sad and paranoid  . Penicillins Hives and Swelling    Has patient had a PCN reaction causing immediate rash, facial/tongue/throat swelling, SOB or lightheadedness with hypotension:YES Has patient had a PCN reaction causing severe rash involving mucus membranes or skin necrosis:UNSURE Has patient had a PCN reaction that required  hospitalization:YES Has patient had a PCN reaction occurring within the last 10 years:No If all of the above answers are "NO", then may proceed with Cephalosporin use.   . Codeine Hives and Swelling  . Lisinopril Cough    New onset dry cough after starting lisinopril. Resolved upon switching to losartan.  Shirlean Kelly [Ferumoxytol] Itching    Tolerated Feraheme 6/26 & 7/21 pre-medications prior to medication       Family History:   Family History  Problem Relation Age of Onset  . Cancer Father     unknown  . Hypertension Mother   . Diverticulitis Mother     Medications: Prior to Admission medications   Medication Sig Start Date End Date Taking? Authorizing Provider  aspirin EC 81 MG tablet Take 1 tablet (81 mg total) by mouth daily. 03/04/15   Burgess Estelle, MD  atenolol (TENORMIN) 50 MG tablet TAKE 1 TABLET BY MOUTH EVERY MORNING 04/27/16   [name], MD  azithromycin (ZITHROMAX Z-PAK) 250 MG tablet Z pak: take as directed. 08/09/16   Susanne Borders, NP  cetirizine (ZYRTEC) 10 MG tablet TAKE 1 TABLET BY MOUTH DAILY 03/29/16   [name], MD  cholecalciferol (VITAMIN D) 1000 units tablet TAKE ONE TABLET BY MOUTH DAILY. 03/01/16   [name], MD  colchicine 0.6 MG tablet Take 1 tablet (0.6 mg total) by mouth 2 (two) times daily. Patient not taking: Reported on 08/09/2016 04/25/16 04/25/17  Juliet Rude, MD  ferrous sulfate 325 (65 FE) MG EC tablet Take 1 tablet (325 mg total) by mouth daily after breakfast. 07/02/16   Truitt Merle, MD  FLUoxetine (PROZAC) 40 MG capsule TAKE 1 CAPSULE BY MOUTH DAILY 05/30/16   [name], MD  gabapentin (NEURONTIN) 400 MG capsule TAKE 2 CAPSULES BY MOUTH THREE TIMES A DAY 03/01/16   [name], MD  hydrochlorothiazide (HYDRODIURIL) 25 MG tablet TAKE 1 TABLET BY MOUTH EVERY MORNING. 08/16/16   [name], MD  losartan (COZAAR) 50 MG tablet TAKE 1 TABLET BY MOUTH EVERY DAY 03/01/16   [name], MD  omeprazole (PRILOSEC) 20 MG capsule  TAKE 1 CAPSULE BY MOUTH EVERY DAY **STOP NEXIUM** 05/30/16   [name], MD  potassium chloride 20 MEQ/15ML (10%) SOLN TAKE 15ML BY MOUTH EVERY DAY 08/16/16   [name], MD  traMADol (ULTRAM) 50 MG tablet Take 50 mg by mouth every 6 (six) hours as needed (for pain.).  07/21/15   Historical Provider, MD  traZODone (DESYREL) 50 MG tablet TAKE 1 TABLET BY MOUTH EVERY NIGHT AT BEDTIME 08/16/16   [name], MD  vitamin C (ASCORBIC ACID) 500 MG tablet Take 500 mg by mouth every morning.    Historical Provider, MD    Physical Exam: Patient Vitals for the past 24 hrs:  BP Temp  Temp src Pulse Resp SpO2 Height Weight  08/17/16 2045 (!) 89/72 - - 95 (!) 21 96 % - -  08/17/16 2030 97/75 - - 94 15 97 % - -  08/17/16 1945 106/66 - - 67 16 98 % - -  08/17/16 1900 101/71 - - 68 (!) 22 (!) 89 % - -  08/17/16 1848 116/87 - - 95 (!) 25 91 % - -  08/17/16 1838 - - - - - - 5\' 3"  (1.6 m) 90.7 kg (200 lb)  08/17/16 1830 124/84 - - 70 14 91 % - -  08/17/16 1817 115/74 98.2 F (36.8 C) Oral 74 18 94 % - -    1. General:  in No Acute distress 2. Psychological: Alert and   Oriented To situation and place 3. Head/ENT:    Dry Mucous Membranes                          Head Non traumatic, neck supple                          Poor Dentition 4. SKIN:  decreased Skin turgor,  Skin clean Dry and intact no rash 5. Heart: Regular rate and rhythm no   Murmur, Rub or gallop 6. Lungs:  no wheezes or crackles   7. Abdomen: Soft, non-tender, Non distended obese 8. Lower extremities: no clubbing, cyanosis, or edema left BKA 9. Neurologically Grossly intact, moving all 4 extremities equally  10. MSK: Normal range of motion   body mass index is 35.43 kg/m.  Labs on Admission:   Labs on Admission: I have personally reviewed following labs and imaging studies  CBC:  Recent Labs Lab 08/17/16 1845 08/17/16 1850  WBC 12.3*  --   NEUTROABS 9.8*  --   HGB 11.1* 12.6  HCT 32.6* 37.0  MCV 85.8  --   PLT 392   --    Basic Metabolic Panel:  Recent Labs Lab 08/17/16 1845 08/17/16 1850  NA 115* 114*  K 5.8* 5.9*  CL 76* 81*  CO2 23  --   GLUCOSE 1,080* >700*  BUN 58* 57*  CREATININE 2.38* 2.10*  CALCIUM 9.6  --    GFR: Estimated Creatinine Clearance: 29.3 mL/min (A) (by C-G formula based on SCr of 2.1 mg/dL (H)). Liver Function Tests:  Recent Labs Lab 08/17/16 1845  AST 17  ALT 15  ALKPHOS 97  BILITOT 0.7  PROT 8.7*  ALBUMIN 4.3   No results for input(s): LIPASE, AMYLASE in the last 168 hours. No results for input(s): AMMONIA in the last 168 hours. Coagulation Profile: No results for input(s): INR, PROTIME in the last 168 hours. Cardiac Enzymes:  Recent Labs Lab 08/17/16 1845  TROPONINI <0.03   BNP (last 3 results) No results for input(s): PROBNP in the last 8760 hours. HbA1C: No results for input(s): HGBA1C in the last 72 hours. CBG:  Recent Labs Lab 08/17/16 1832  GLUCAP >600*   Lipid Profile: No results for input(s): CHOL, HDL, LDLCALC, TRIG, CHOLHDL, LDLDIRECT in the last 72 hours. Thyroid Function Tests: No results for input(s): TSH, T4TOTAL, FREET4, T3FREE, THYROIDAB in the last 72 hours. Anemia Panel: No results for input(s): VITAMINB12, FOLATE, FERRITIN, TIBC, IRON, RETICCTPCT in the last 72 hours. Urine analysis:    Component Value Date/Time   COLORURINE STRAW (A) 08/17/2016 2148   APPEARANCEUR CLEAR 08/17/2016 2148   LABSPEC 1.022 08/17/2016 2148  PHURINE 5.0 08/17/2016 2148   GLUCOSEU >=500 (A) 08/17/2016 2148   HGBUR SMALL (A) 08/17/2016 2148   BILIRUBINUR NEGATIVE 08/17/2016 2148   KETONESUR 5 (A) 08/17/2016 2148   PROTEINUR 30 (A) 08/17/2016 2148   UROBILINOGEN 1.0 10/24/2014 1555   NITRITE NEGATIVE 08/17/2016 2148   LEUKOCYTESUR NEGATIVE 08/17/2016 2148   Sepsis Labs: @LABRCNTIP (procalcitonin:4,lacticidven:4) )No results found for this or any previous visit (from the past 240 hour(s)).     UA   no evidence of UTI     Lab Results   Component Value Date   HGBA1C 5.5 03/08/2016    Estimated Creatinine Clearance: 29.3 mL/min (A) (by C-G formula based on SCr of 2.1 mg/dL (H)).  BNP (last 3 results) No results for input(s): PROBNP in the last 8760 hours.   ECG REPORT  Independently reviewed Rate: 69  Rhythm: NSR ST&T Change: No acute ischemic changes   QTC 450  Filed Weights   08/17/16 1838  Weight: 90.7 kg (200 lb)     Cultures:    Component Value Date/Time   SDES ABDOMEN 06/17/2014 2326   SPECREQUEST Normal 06/17/2014 2326   CULT  06/17/2014 2326    MULTIPLE ORGANISMS PRESENT, NONE PREDOMINANT Note: NO STAPHYLOCOCCUS AUREUS ISOLATED NO GROUP A STREP (S.PYOGENES) ISOLATED Performed at [facility] 06/20/2014 FINAL 06/17/2014 2326     Radiological Exams on Admission: Dg Chest Portable 1 View  Result Date: 08/17/2016 CLINICAL DATA:  Progressive confusion over the last week. History of diabetes and hypertension. EXAM: PORTABLE CHEST 1 VIEW COMPARISON:  08/09/2016 and 09/15/2015. FINDINGS: 1847 hour. The heart size and mediastinal contours are stable. There are lower lung volumes with increased subsegmental atelectasis at both lung bases. Left apical lucency is stable, consistent with emphysema. No confluent airspace opacity, pleural effusion or pneumothorax seen. The bones appear unchanged. Telemetry leads overlie the chest. IMPRESSION: Increased bibasilar atelectasis.  No other significant changes. Electronically Signed   By: Richardean Sale M.D.   On: 08/17/2016 18:56    Chart has been reviewed    Assessment/Plan   66 y.o. female with medical history significant of   Iron deficiency anemia, anxiety DM 2 GERD, small bowel AVMs with history of GI bleed , Hepatitis C with history of cirrhosis completed Harvoni in2016 , CVA sp Left BKA Admitted for hyperosmalor non-ketotic state with acute encephalopathy  Present on Admission:  . Hyperosmolar non-ketotic state in patient with  type 2 diabetes mellitus ([place]) admit to step down on insulin drip currently blood sugar has been coming down patient is improving but not back to baseline . Acute encephalopathy most likely secondary to hyperglycemia currently improving check ammonia level given history of cirrhosis . Essential hypertension, benign given soft blood pressures will hold for tonight . Hepatic cirrhosis ([place]) Will obtain LFTs check ammonia level . Well controlled type 2 diabetes mellitus with peripheral neuropathy ([place]) . AKI (acute kidney injury) ([place]) Will rehydrate creatinine coming down with IV fluids resuscitation . Iron deficiency anemia due to chronic blood loss chronic currently hemoglobin at baseline . Hypotension apparently positional improved with hand placement, for completeness will have blood cultures lactic acid pro calcitonin, patient denies any fever no evidence of infectious etiology hold off on antibiotics as blood pressure improves with fluids and positioning  Other plan as per orders.  DVT prophylaxis:    Lovenox     Code Status:  FULL CODE  as per  family   Family Communication:   Family  at  Bedside  plan of care was discussed with   Son,  Disposition Plan:     To home once workup is complete and patient is stable                         Would benefit from PT/OT eval prior to DC  ordered                       Diabetes coordinator  Nutrition   consulted                          Consults called: none  Admission status:   Inpatient    Level of care  SDU      I have spent a total of 68 min on this admission   Xanthe Couillard 08/17/2016, 11:16 PM    [facility]  Pager [phone]   after 2 AM please page floor coverage PA If 7AM-7PM, please contact the day team taking care of the patient  [email or web address]  Password [email or web address]

## 2016-08-18 ENCOUNTER — Inpatient Hospital Stay (HOSPITAL_COMMUNITY): Payer: Medicare Other

## 2016-08-18 DIAGNOSIS — N179 Acute kidney failure, unspecified: Secondary | ICD-10-CM

## 2016-08-18 DIAGNOSIS — I10 Essential (primary) hypertension: Secondary | ICD-10-CM

## 2016-08-18 DIAGNOSIS — E1101 Type 2 diabetes mellitus with hyperosmolarity with coma: Secondary | ICD-10-CM

## 2016-08-18 DIAGNOSIS — G9340 Encephalopathy, unspecified: Secondary | ICD-10-CM

## 2016-08-18 DIAGNOSIS — I959 Hypotension, unspecified: Secondary | ICD-10-CM

## 2016-08-18 LAB — COMPREHENSIVE METABOLIC PANEL
ALBUMIN: 3.2 g/dL — AB (ref 3.5–5.0)
ALT: 12 U/L — AB (ref 14–54)
AST: 14 U/L — ABNORMAL LOW (ref 15–41)
Alkaline Phosphatase: 66 U/L (ref 38–126)
Anion gap: 5 (ref 5–15)
BUN: 52 mg/dL — AB (ref 6–20)
CHLORIDE: 102 mmol/L (ref 101–111)
CO2: 26 mmol/L (ref 22–32)
CREATININE: 2.04 mg/dL — AB (ref 0.44–1.00)
Calcium: 8 mg/dL — ABNORMAL LOW (ref 8.9–10.3)
GFR calc Af Amer: 29 mL/min — ABNORMAL LOW (ref >60)
GFR, EST NON AFRICAN AMERICAN: 25 mL/min — AB (ref >60)
Glucose, Bld: 221 mg/dL — ABNORMAL HIGH (ref 65–99)
POTASSIUM: 3.5 mmol/L (ref 3.5–5.1)
Sodium: 133 mmol/L — ABNORMAL LOW (ref 135–145)
Total Bilirubin: 0.4 mg/dL (ref 0.3–1.2)
Total Protein: 6.3 g/dL — ABNORMAL LOW (ref 6.5–8.1)

## 2016-08-18 LAB — BASIC METABOLIC PANEL
ANION GAP: 9 (ref 5–15)
ANION GAP: 6 (ref 5–15)
BUN: 52 mg/dL — ABNORMAL HIGH (ref 6–20)
BUN: 45 mg/dL — ABNORMAL HIGH (ref 6–20)
CALCIUM: 8.8 mg/dL — AB (ref 8.9–10.3)
CALCIUM: 8.1 mg/dL — AB (ref 8.9–10.3)
CHLORIDE: 97 mmol/L — AB (ref 101–111)
CO2: 25 mmol/L (ref 22–32)
CO2: 25 mmol/L (ref 22–32)
Chloride: 104 mmol/L (ref 101–111)
Creatinine, Ser: 2.45 mg/dL — ABNORMAL HIGH (ref 0.44–1.00)
Creatinine, Ser: 1.63 mg/dL — ABNORMAL HIGH (ref 0.44–1.00)
GFR calc non Af Amer: 20 mL/min — ABNORMAL LOW (ref >60)
GFR calc non Af Amer: 33 mL/min — ABNORMAL LOW (ref >60)
GFR, EST AFRICAN AMERICAN: 23 mL/min — AB (ref >60)
GFR, EST AFRICAN AMERICAN: 38 mL/min — AB (ref >60)
Glucose, Bld: 299 mg/dL — ABNORMAL HIGH (ref 65–99)
Glucose, Bld: 126 mg/dL — ABNORMAL HIGH (ref 65–99)
POTASSIUM: 3.7 mmol/L (ref 3.5–5.1)
POTASSIUM: 3.7 mmol/L (ref 3.5–5.1)
Sodium: 131 mmol/L — ABNORMAL LOW (ref 135–145)
Sodium: 135 mmol/L (ref 135–145)

## 2016-08-18 LAB — GLUCOSE, CAPILLARY
GLUCOSE-CAPILLARY: 317 mg/dL — AB (ref 65–99)
GLUCOSE-CAPILLARY: 223 mg/dL — AB (ref 65–99)
GLUCOSE-CAPILLARY: 146 mg/dL — AB (ref 65–99)
GLUCOSE-CAPILLARY: 141 mg/dL — AB (ref 65–99)
GLUCOSE-CAPILLARY: 162 mg/dL — AB (ref 65–99)
GLUCOSE-CAPILLARY: 185 mg/dL — AB (ref 65–99)
GLUCOSE-CAPILLARY: 178 mg/dL — AB (ref 65–99)
GLUCOSE-CAPILLARY: 337 mg/dL — AB (ref 65–99)
Glucose-Capillary: 490 mg/dL — ABNORMAL HIGH (ref 65–99)
Glucose-Capillary: 228 mg/dL — ABNORMAL HIGH (ref 65–99)
Glucose-Capillary: 154 mg/dL — ABNORMAL HIGH (ref 65–99)
Glucose-Capillary: 201 mg/dL — ABNORMAL HIGH (ref 65–99)
Glucose-Capillary: 106 mg/dL — ABNORMAL HIGH (ref 65–99)
Glucose-Capillary: 116 mg/dL — ABNORMAL HIGH (ref 65–99)
Glucose-Capillary: 126 mg/dL — ABNORMAL HIGH (ref 65–99)
Glucose-Capillary: 390 mg/dL — ABNORMAL HIGH (ref 65–99)

## 2016-08-18 LAB — TROPONIN I: Troponin I: <0.03 ng/mL (ref <0.03)

## 2016-08-18 LAB — CBC
HEMATOCRIT: 25 % — AB (ref 36.0–46.0)
Hemoglobin: 9 g/dL — ABNORMAL LOW (ref 12.0–15.0)
MCH: 29.8 pg (ref 26.0–34.0)
MCHC: 36 g/dL (ref 30.0–36.0)
MCV: 82.8 fL (ref 78.0–100.0)
PLATELETS: 274 10*3/uL (ref 150–400)
RBC: 3.02 MIL/uL — ABNORMAL LOW (ref 3.87–5.11)
RDW: 12.3 % (ref 11.5–15.5)
WBC: 10.7 10*3/uL — AB (ref 4.0–10.5)

## 2016-08-18 LAB — LACTIC ACID, PLASMA: Lactic Acid, Venous: 1.7 mmol/L (ref 0.5–1.9)

## 2016-08-18 LAB — MRSA PCR SCREENING: MRSA BY PCR: NEGATIVE

## 2016-08-18 LAB — MAGNESIUM: Magnesium: 2 mg/dL (ref 1.7–2.4)

## 2016-08-18 LAB — PHOSPHORUS: PHOSPHORUS: 3.7 mg/dL (ref 2.5–4.6)

## 2016-08-18 LAB — HIV ANTIBODY (ROUTINE TESTING W REFLEX): HIV Screen 4th Generation wRfx: NONREACTIVE

## 2016-08-18 LAB — TSH: TSH: 0.338 u[IU]/mL — AB (ref 0.350–4.500)

## 2016-08-18 MED ORDER — INSULIN ASPART 100 UNIT/ML ~~LOC~~ SOLN: 0.0000 [IU] | Freq: Three times a day (TID) | SUBCUTANEOUS | Status: DC

## 2016-08-18 MED ORDER — DEXTROSE-NACL 5-0.45 % IV SOLN
INTRAVENOUS | Status: DC
  Administered 2016-08-18 (×2): via INTRAVENOUS

## 2016-08-18 MED ORDER — SODIUM CHLORIDE 0.9 % IV BOLUS (SEPSIS)
1000.0000 mL | Freq: Once | INTRAVENOUS | Status: AC
  Administered 2016-08-18: 1000 mL via INTRAVENOUS

## 2016-08-18 MED ORDER — ENOXAPARIN SODIUM 40 MG/0.4ML ~~LOC~~ SOLN
40.0000 mg | Freq: Every day | SUBCUTANEOUS | Status: DC
  Administered 2016-08-18 – 2016-08-20 (×3): 40 mg via SUBCUTANEOUS
  Filled 2016-08-18 (×4): qty 0.4

## 2016-08-18 MED ORDER — FENTANYL CITRATE (PF) 100 MCG/2ML IJ SOLN
25.0000 ug | Freq: Once | INTRAMUSCULAR | Status: AC
  Administered 2016-08-18: 25 ug via INTRAVENOUS
  Filled 2016-08-18: qty 2

## 2016-08-18 MED ORDER — SODIUM CHLORIDE 0.9 % IV BOLUS (SEPSIS)
2000.0000 mL | Freq: Once | INTRAVENOUS | Status: AC
  Administered 2016-08-18 (×2): 1000 mL via INTRAVENOUS

## 2016-08-18 MED ORDER — TRAMADOL HCL 50 MG PO TABS
50.0000 mg | ORAL_TABLET | Freq: Four times a day (QID) | ORAL | Status: DC | PRN
  Administered 2016-08-18 – 2016-08-19 (×3): 50 mg via ORAL
  Filled 2016-08-18 (×3): qty 1

## 2016-08-18 MED ORDER — INSULIN ASPART 100 UNIT/ML ~~LOC~~ SOLN
0.0000 [IU] | Freq: Three times a day (TID) | SUBCUTANEOUS | Status: DC
  Administered 2016-08-18: 3 [IU] via SUBCUTANEOUS
  Administered 2016-08-18: 15 [IU] via SUBCUTANEOUS
  Administered 2016-08-19: 5 [IU] via SUBCUTANEOUS
  Administered 2016-08-19: 15 [IU] via SUBCUTANEOUS
  Administered 2016-08-19: 5 [IU] via SUBCUTANEOUS
  Administered 2016-08-20: 11 [IU] via SUBCUTANEOUS
  Administered 2016-08-20: 8 [IU] via SUBCUTANEOUS
  Administered 2016-08-20: 3 [IU] via SUBCUTANEOUS
  Administered 2016-08-21: 11 [IU] via SUBCUTANEOUS
  Administered 2016-08-21: 8 [IU] via SUBCUTANEOUS

## 2016-08-18 MED ORDER — LIVING WELL WITH DIABETES BOOK
Freq: Once | Status: AC
  Administered 2016-08-18: 11:00:00
  Filled 2016-08-18: qty 1

## 2016-08-18 MED ORDER — INSULIN ASPART 100 UNIT/ML ~~LOC~~ SOLN
0.0000 [IU] | Freq: Every day | SUBCUTANEOUS | Status: DC
  Administered 2016-08-18: 4 [IU] via SUBCUTANEOUS
  Administered 2016-08-20: 2 [IU] via SUBCUTANEOUS

## 2016-08-18 NOTE — Evaluation (Signed)
Occupational Therapy Evaluation Patient Details Name: [name] MRN: [id number] DOB: 07/19/1950 Today's Date: 08/18/2016    History of Present Illness This 66 y.o. admitted with AMS.   In ED she was found to have BG > 1000 and was severely volume depleted.  Dx:  nonketotic hyperosmolar hyperglycemia, secondary AKI and encephalopathy.  PMH:  H/o CVA with no residual deficits, PVD, HTN, Hepatitis C, GERD, DM, anxiety, s/p s/p AKA   Clinical Impression   Pt admitted with above. She demonstrates the below listed deficits and will benefit from continued OT to maximize safety and independence with BADLs.  Pt presents to OT with new onset Rt LE pain and numbness, mild confusion, generalized weakness, and decreased activity tolerance.  She requires min A, overall for ADLs.   She has CAPs worker 7 days/week and her niece assists her PRN.  Will follow acutely.       Follow Up Recommendations  Home health OT;Supervision/Assistance - 24 hour    Equipment Recommendations  None recommended by OT    Recommendations for Other Services       Precautions / Restrictions Precautions Precautions: Fall Restrictions Weight Bearing Restrictions: No      Mobility Bed Mobility Overal bed mobility: Needs Assistance Bed Mobility: Supine to Sit     Supine to sit: Supervision        Transfers Overall transfer level: Needs assistance Equipment used: None Transfers: Sit to/from Stand;Stand Pivot Transfers Sit to Stand: Min guard Stand pivot transfers: Min guard       General transfer comment: pt states she doesn't like people helping/touching her.  min guard provided for safety due to severity of Rt LE pain     Balance Overall balance assessment: Needs assistance Sitting-balance support: Feet supported Sitting balance-Leahy Scale: Good     Standing balance support: Single extremity supported Standing balance-Leahy Scale: Fair                             ADL either  performed or assessed with clinical judgement   ADL Overall ADL's : Needs assistance/impaired Eating/Feeding: Independent   Grooming: Wash/dry hands;Wash/dry face;Oral care;Brushing hair;Set up;Sitting   Upper Body Bathing: Set up;Supervision/ safety;Sitting   Lower Body Bathing: Minimal assistance;Sit to/from stand Lower Body Bathing Details (indicate cue type and reason): Pt applied lotion to LE simulating bathing  Upper Body Dressing : Set up;Sitting   Lower Body Dressing: Minimal assistance;Sit to/from stand   Toilet Transfer: Min guard;Stand-pivot;BSC   Toileting- Water quality scientist and Hygiene: Minimal assistance;Sitting/lateral lean       Functional mobility during ADLs: Warden/ranger      Pertinent Vitals/Pain Pain Assessment: Faces Faces Pain Scale: Hurts even more Pain Location: Rt foot  Pain Descriptors / Indicators: Burning;Constant;Pins and needles Pain Intervention(s): Monitored during session;Premedicated before session     Hand Dominance Right   Extremity/Trunk Assessment Upper Extremity Assessment Upper Extremity Assessment: Generalized weakness   Lower Extremity Assessment Lower Extremity Assessment: Defer to PT evaluation   Cervical / Trunk Assessment Cervical / Trunk Assessment: Normal   Communication Communication Communication: No difficulties   Cognition Arousal/Alertness: Awake/alert Behavior During Therapy: WFL for tasks assessed/performed Overall Cognitive Status: Impaired/Different from baseline Area of Impairment: Attention;Safety/judgement;Problem solving  Current Attention Level: Selective     Safety/Judgement: Decreased awareness of safety   Problem Solving: Requires verbal cues General Comments: Pt somewhat impulsive    General Comments  VSS     Exercises     Shoulder Instructions      Home Living Family/patient expects to be discharged to::  Private residence Living Arrangements: Alone Available Help at Discharge: Available PRN/intermittently;Personal care attendant;Family Type of Home: Apartment Home Access: Level entry     Home Layout: One level     Bathroom Shower/Tub: Teacher, early years/pre: Standard     Home Equipment: Environmental consultant - 2 wheels;Wheelchair - manual   Additional Comments: Pt reports daily assist of CAPS worker and intermittent assist of niece      Prior Functioning/Environment Level of Independence: Needs assistance  Gait / Transfers Assistance Needed: Pt reports she last ambulated 08/09/16 and she either ambulates with RW or manual w/c in her apt, or electric w/c in the community.  She sometimes ambulates around grocery store [place]  ADL's / Homemaking Assistance Needed: Pt bathes with supervision - min A.  She requires intermittent assist with cooking.     Comments: Pt acknowledges frequent falls         OT Problem List: Decreased strength;Decreased activity tolerance;Impaired balance (sitting and/or standing);Decreased safety awareness;Decreased knowledge of use of DME or AE;Pain      OT Treatment/Interventions: Self-care/ADL training;Therapeutic exercise;DME and/or AE instruction;Therapeutic activities;Patient/family education;Balance training    OT Goals(Current goals can be found in the care plan section) Acute Rehab OT Goals Patient Stated Goal: to go home  OT Goal Formulation: With patient Time For Goal Achievement: 09/01/16 Potential to Achieve Goals: Good ADL Goals Pt Will Perform Upper Body Bathing: with set-up;sitting Pt Will Perform Lower Body Bathing: with supervision;sit to/from stand Pt Will Perform Upper Body Dressing: with set-up;sitting Pt Will Perform Lower Body Dressing: with supervision;sit to/from stand Pt Will Transfer to Toilet: with supervision;stand pivot transfer;bedside commode Pt Will Perform Toileting - Clothing Manipulation and hygiene: with  supervision;sit to/from stand  OT Frequency: Min 2X/week   Barriers to D/C:            Co-evaluation              End of Session Equipment Utilized During Treatment: Gait belt Nurse Communication: Mobility status  Activity Tolerance: Patient tolerated treatment well Patient left: in chair;with call bell/phone within reach;with chair alarm set  OT Visit Diagnosis: Muscle weakness (generalized) (M62.81)                Time: 7543-6067 OT Time Calculation (min): 36 min Charges:  OT General Charges $OT Visit: 1 Procedure OT Evaluation $OT Eval Moderate Complexity: 1 Procedure OT Treatments $Self Care/Home Management : 8-22 mins G-Codes:     [facility], OTR/L [phone]   [name] 08/18/2016, 4:04 PM

## 2016-08-18 NOTE — Evaluation (Signed)
Physical Therapy Evaluation Patient Details Name: [name] MRN: [id number] DOB: 1950/11/27 Today's Date: 08/18/2016   History of Present Illness  Pt admitted through ED AMS and with hc of CVA (no residual), PVD , DM, and L AKA (s/p 9 yrs)  Clinical Impression  Pt admitted as above and presenting with functional mobility limitations 2* generalized weakness, mild balance deficits and resolving AMS.  Pt hope to progress to dc to previous living arrangement.    Follow Up Recommendations Home health PT;SNF (dependent on acute stay progress)    Equipment Recommendations  None recommended by PT    Recommendations for Other Services OT consult     Precautions / Restrictions Precautions Precautions: Fall Restrictions Weight Bearing Restrictions: No      Mobility  Bed Mobility Overal bed mobility: Needs Assistance Bed Mobility: Supine to Sit     Supine to sit: Min assist     General bed mobility comments: to complete transition to EOB sitting  Transfers Overall transfer level: Needs assistance   Transfers: Stand Pivot Transfers   Stand pivot transfers: Min guard;Supervision       General transfer comment: Pt states if people touch me when I transfer it makes me fall..  Completed with VERY close supervision  Ambulation/Gait                Stairs            Wheelchair Mobility    Modified Rankin (Stroke Patients Only)       Balance Overall balance assessment: Needs assistance Sitting-balance support: No upper extremity supported;Feet supported Sitting balance-Leahy Scale: Good     Standing balance support: Single extremity supported Standing balance-Leahy Scale: Fair (on single LE)                               Pertinent Vitals/Pain Pain Assessment: No/denies pain    Home Living Family/patient expects to be discharged to:: Private residence Living Arrangements: Alone Available Help at Discharge: Available  PRN/intermittently;Personal care attendant;Family Type of Home: Apartment Home Access: Level entry     Home Layout: One level Home Equipment: [place] - 2 wheels;Wheelchair - manual Additional Comments: Pt reports daily assist of CAPS worker and intermittent assist of niece    Prior Function Level of Independence: Needs assistance   Gait / Transfers Assistance Needed: Pt reports very ltd ambulation with RW, largely using WC.    ADL's / Homemaking Assistance Needed: Assist of CAPS worker        Hand Dominance        Extremity/Trunk Assessment   Upper Extremity Assessment Upper Extremity Assessment: Generalized weakness    Lower Extremity Assessment Lower Extremity Assessment: Generalized weakness       Communication   Communication: No difficulties  Cognition Arousal/Alertness: Awake/alert Behavior During Therapy: Restless Overall Cognitive Status: Impaired/Different from baseline Area of Impairment: Orientation                 Orientation Level: Situation                    General Comments      Exercises     Assessment/Plan    PT Assessment Patient needs continued PT services  PT Problem List Decreased activity tolerance;Decreased balance;Decreased mobility;Decreased cognition;Obesity       PT Treatment Interventions DME instruction;Gait training;Functional mobility training;Therapeutic activities;Therapeutic exercise;Balance training;Patient/family education    PT Goals (Current goals can be  found in the Care Plan section)  Acute Rehab PT Goals Patient Stated Goal: Get over to that chair PT Goal Formulation: With patient Time For Goal Achievement: 09/01/16 Potential to Achieve Goals: Fair    Frequency Min 3X/week   Barriers to discharge Decreased caregiver support does not have 24/7    Co-evaluation               End of Session Equipment Utilized During Treatment: Gait belt Activity Tolerance: Patient limited by  fatigue Patient left: in chair;with call bell/phone within reach;with chair alarm set;with nursing/sitter in room Nurse Communication: Mobility status PT Visit Diagnosis: Unsteadiness on feet (R26.81)    Time: 3785-8850 PT Time Calculation (min) (ACUTE ONLY): 20 min   Charges:   PT Evaluation $PT Eval Moderate Complexity: 1 Procedure     PT G Codes:        [id number]  Briscoe Daniello 08/18/2016, 12:39 PM

## 2016-08-18 NOTE — Progress Notes (Signed)
PROGRESS NOTE  [name] GLO:[id number] DOB: 30-Dec-1950 DOA: 08/17/2016 PCP: [name], MD  HPI/Recap of past 24 hours: He actually 66 year old female with past medical history of essential hypertension, iron deficiency anemia from chronic blood loss from AVMs, hepatic cirrhosis and diet-controlled diabetes who overall has had quite good control of her diabetes (A1c in October was 5.5) and in fact, patient's PCP stopped her metformin around that time. Patient states she's overall been in good health but then in the last month, she's had episodes with nausea and vomiting so for the last month she has only drank ensure and eaten no food.  She denies any issues with coughing, urinary frequency or fevers. Patient was at home by herself, but with family nearby. Family brought patient on afternoon of 3/23 for confusion-patient was mumbling, delirious.  In the emergency room, patient found to have sugars greater than 1000 and she was severely volume depleted. Systolic blood pressure in the 60s. Patient received multiple multiple fluid boluses as well as started on an insulin drip. She was not in DKA. White count was only minimally elevated at 12.3 and pro-calcitonin level normal. Lactic acid level elevated at 2.2, although this was felt to be more related to severe volume depletion. Hospitalists were called for further evaluation and admission. Over the next 24 hours, sugars came down to normal range and patient was taken off of insulin drip. She was fully alert and oriented.  Currently she is doing okay with complaints of intermittent blurry vision and some severe right leg pain.   Assess  ment/Plan: Active Problems:   Essential hypertension, benign   Well controlled type 2 diabetes mellitus with peripheral neuropathy ([place]) who presented with nonketotic hyperosmolar hyperglycemia and secondary acute kidney injury: I suspect the patient did not have infection which that this soft, rather being on  diet control only and drinking ensure led to severe hyperglycemia and then secondary diuresis and volume depletion. Sugars corrected and waiting for A1c to come back. We'll continue aggressive volume resuscitation given that her blood pressure still soft and kidney function not yet back to normal.   Iron deficiency anemia due to chronic blood loss: Hemoglobin on admission at 12.6 however this is likely hemoconcentrated and hemoglobin this morning at 9, more accurate. Continue to follow    Hepatic cirrhosis ([place]): Stable  Nausea and vomiting: Patient has been on ensure for the last 1 month. Checking portable abdominal x-ray. She's had a number of abdominal surgeries    Hypotension: Secondary to hyperglycemia. Improving with fluid resuscitation    Hyperosmolar non-ketotic state in patient with type 2 diabetes mellitus ([place])    Acute encephalopathy: Secondary to nonketotic hyperglycemia. Resolved.  Code Status:full code  Family Communication: left message for family  Disposition Plan: transfer to floor. Stabilized. Potential discharge in next 1-2 days     Consultants:  None    Procedures:  None    Antimicrobials:  None    DVT prophylaxis:  Lovenox   Objective: Vitals:   08/18/16 0930 08/18/16 1000 08/18/16 1030 08/18/16 1200  BP: (!) 93/49 (!) 82/55 (!) 107/51   Pulse: 65 66 69   Resp: 14 16 16    Temp:    98.2 F (36.8 C)  TempSrc:    Oral  SpO2: 99% 98% 94%   Weight:      Height:        Intake/Output Summary (Last 24 hours) at 08/18/16 1445 Last data filed at 08/18/16 1400  Gross per 24  hour  Intake          5150.17 ml  Output              525 ml  Net          4625.17 ml   Filed Weights   08/17/16 1838 08/17/16 2300  Weight: 90.7 kg (200 lb) 81.9 kg (180 lb 8.9 oz)    Exam:   General:  Alert and oriented 3, no acute distress   Cardiovascular: regular rate and rhythm, S1-S2    Respiratory: clear to auscultation bilaterally    Abdomen: soft,  nontender, nondistended, hypoactive bowel sounds. Patient has multiple scars and deformity in her abdomen following previous surgeries for diverticulosis and hernia repair    Musculoskeletal: status post left BKA, right lower extremity with no clubbing or cyanosis, trace pitting edema    Skin: No skin breaks tears or lesions other than described above in abdominal area   Psychiatry: patient is appropriate, no evidence of psychoses     Data Reviewed: CBC:  Recent Labs Lab 08/17/16 1845 08/17/16 1850 08/18/16 0330  WBC 12.3*  --  10.7*  NEUTROABS 9.8*  --   --   HGB 11.1* 12.6 9.0*  HCT 32.6* 37.0 25.0*  MCV 85.8  --  82.8  PLT 392  --  794   Basic Metabolic Panel:  Recent Labs Lab 08/17/16 1845 08/17/16 1850 08/17/16 2207 08/18/16 0137 08/18/16 0330 08/18/16 0954  NA 115* 114* 122* 131* 133* 135  K 5.8* 5.9* 4.7 3.7 3.5 3.7  CL 76* 81* 87* 97* 102 104  CO2 23  --  23 25 26 25   GLUCOSE 1,080* >700* 813* 299* 221* 126*  BUN 58* 57* 53* 52* 52* 45*  CREATININE 2.38* 2.10* 2.13* 2.45* 2.04* 1.63*  CALCIUM 9.6  --  9.2 8.8* 8.0* 8.1*  MG  --   --   --   --  2.0  --   PHOS  --   --   --   --  3.7  --    GFR: Estimated Creatinine Clearance: 35.8 mL/min (A) (by C-G formula based on SCr of 1.63 mg/dL (H)). Liver Function Tests:  Recent Labs Lab 08/17/16 1845 08/18/16 0330  AST 17 14*  ALT 15 12*  ALKPHOS 97 66  BILITOT 0.7 0.4  PROT 8.7* 6.3*  ALBUMIN 4.3 3.2*   No results for input(s): LIPASE, AMYLASE in the last 168 hours.  Recent Labs Lab 08/17/16 2207  AMMONIA 26   Coagulation Profile:  Recent Labs Lab 08/17/16 2206  INR 1.05   Cardiac Enzymes:  Recent Labs Lab 08/17/16 1845 08/17/16 2206 08/18/16 0330 08/18/16 0954  TROPONINI <0.03 <0.03 <0.03 <0.03   BNP (last 3 results) No results for input(s): PROBNP in the last 8760 hours. HbA1C: No results for input(s): HGBA1C in the last 72 hours. CBG:  Recent Labs Lab 08/18/16 0937  08/18/16 1001 08/18/16 1111 08/18/16 1204 08/18/16 1316  GLUCAP 116* 126* 162* 185* 178*   Lipid Profile: No results for input(s): CHOL, HDL, LDLCALC, TRIG, CHOLHDL, LDLDIRECT in the last 72 hours. Thyroid Function Tests:  Recent Labs  08/18/16 0330  TSH 0.338*   Anemia Panel: No results for input(s): VITAMINB12, FOLATE, FERRITIN, TIBC, IRON, RETICCTPCT in the last 72 hours. Urine analysis:    Component Value Date/Time   COLORURINE STRAW (A) 08/17/2016 2148   APPEARANCEUR CLEAR 08/17/2016 2148   LABSPEC 1.022 08/17/2016 2148   PHURINE 5.0 08/17/2016 2148   GLUCOSEU >=  500 (A) 08/17/2016 2148   HGBUR SMALL (A) 08/17/2016 2148   BILIRUBINUR NEGATIVE 08/17/2016 2148   KETONESUR 5 (A) 08/17/2016 2148   PROTEINUR 30 (A) 08/17/2016 2148   UROBILINOGEN 1.0 10/24/2014 1555   NITRITE NEGATIVE 08/17/2016 2148   LEUKOCYTESUR NEGATIVE 08/17/2016 2148   Sepsis Labs: @LABRCNTIP (procalcitonin:4,lacticidven:4)  ) Recent Results (from the past 240 hour(s))  MRSA PCR Screening     Status: None   Collection Time: 08/17/16 10:52 PM  Result Value Ref Range Status   MRSA by PCR NEGATIVE NEGATIVE Final    Comment:        The GeneXpert MRSA Assay (FDA approved for NASAL specimens only), is one component of a comprehensive MRSA colonization surveillance program. It is not intended to diagnose MRSA infection nor to guide or monitor treatment for MRSA infections.       Studies: Dg Chest Portable 1 View  Result Date: 08/17/2016 CLINICAL DATA:  Progressive confusion over the last week. History of diabetes and hypertension. EXAM: PORTABLE CHEST 1 VIEW COMPARISON:  08/09/2016 and 09/15/2015. FINDINGS: 1847 hour. The heart size and mediastinal contours are stable. There are lower lung volumes with increased subsegmental atelectasis at both lung bases. Left apical lucency is stable, consistent with emphysema. No confluent airspace opacity, pleural effusion or pneumothorax seen. The bones  appear unchanged. Telemetry leads overlie the chest. IMPRESSION: Increased bibasilar atelectasis.  No other significant changes. Electronically Signed   By: Richardean Sale M.D.   On: 08/17/2016 18:56   Dg Abd Portable 1v  Result Date: 08/18/2016 CLINICAL DATA:  Patient with obstipation. EXAM: PORTABLE ABDOMEN - 1 VIEW COMPARISON:  Abdominal ultrasound 03/23/2016 FINDINGS: Lung bases are clear. Gas is demonstrated within nondilated loops of large and small bowel in a nonobstructed pattern. Supine evaluation limited for the detection of free intraperitoneal air. Lumbar spine degenerative changes. IMPRESSION: Nonobstructed bowel gas pattern. Electronically Signed   By: Lovey Newcomer M.D.   On: 08/18/2016 13:35    Scheduled Meds: . aspirin EC  81 mg Oral Daily  . enoxaparin (LOVENOX) injection  40 mg Subcutaneous QHS  . ferrous sulfate  325 mg Oral QPC breakfast  . gabapentin  800 mg Oral TID  . insulin aspart  0-15 Units Subcutaneous TID WC  . insulin aspart  0-5 Units Subcutaneous QHS  . loratadine  10 mg Oral Daily  . sodium chloride flush  3 mL Intravenous Q12H    Continuous Infusions: . sodium chloride 125 mL/hr at 08/18/16 1400     LOS: 1 day     Annita Brod, MD [facility] Pager [phone]  If 7PM-7AM, please contact night-coverage [email or web address] Password [facility] 08/18/2016, 2:45 PM

## 2016-08-18 NOTE — Progress Notes (Signed)
Inpatient Diabetes Program Recommendations  AACE/ADA: New Consensus Statement on Inpatient Glycemic Control (2015)  Target Ranges:  Prepandial:   less than 140 mg/dL      Peak postprandial:   less than 180 mg/dL (1-2 hours)      Critically ill patients:  140 - 180 mg/dL   Review of Glycemic Control  Diabetes history: DM 2 Diet Controlled Current orders for Inpatient glycemic control: IV Insulin  Inpatient Diabetes Program Recommendations:   Patient with DM 2 previously diet controlled. Elevated Real function weight 81.9 kg. When transition is appropriate consider Levemir 14-16 units, Novolog Sensitive Correction + HS scale.   Will order the Living Well with DM booklet. A1c level may not result until Monday at the earliest. Last A1c level was in 10/17 and was 5.5%. If patient will be d/cing on insulin, she will need to monitor glucose levels at home and follow up with PCP.  Patient will need to watch the insulin pen video (#511), insulin injection (#506), reducing fears about injections (#508), Monitoring glucose (#510). RN to review insulin pen with insulin pen teaching stations located on the nursing unit.  Thanks,  Tama Headings RN, MSN, [facility] Inpatient Diabetes Coordinator Team Pager [phone] (8a-5p)

## 2016-08-18 NOTE — Progress Notes (Signed)
Assumed care of pt from [name], [place] at [phone].  I agree with her previous assessment.  Will continue with plan of care.

## 2016-08-18 NOTE — Progress Notes (Signed)
B/P decreased to 58/43, TRH1 made aware pt receiving NS bolus 1 liter.

## 2016-08-19 ENCOUNTER — Inpatient Hospital Stay (HOSPITAL_COMMUNITY): Payer: Medicare Other

## 2016-08-19 DIAGNOSIS — M25561 Pain in right knee: Secondary | ICD-10-CM

## 2016-08-19 DIAGNOSIS — D500 Iron deficiency anemia secondary to blood loss (chronic): Secondary | ICD-10-CM

## 2016-08-19 DIAGNOSIS — M79609 Pain in unspecified limb: Secondary | ICD-10-CM

## 2016-08-19 LAB — BASIC METABOLIC PANEL
ANION GAP: 6 (ref 5–15)
BUN: 30 mg/dL — AB (ref 6–20)
CO2: 22 mmol/L (ref 22–32)
Calcium: 8.3 mg/dL — ABNORMAL LOW (ref 8.9–10.3)
Chloride: 106 mmol/L (ref 101–111)
Creatinine, Ser: 1.29 mg/dL — ABNORMAL HIGH (ref 0.44–1.00)
GFR calc Af Amer: 50 mL/min — ABNORMAL LOW (ref >60)
GFR calc non Af Amer: 43 mL/min — ABNORMAL LOW (ref >60)
Glucose, Bld: 349 mg/dL — ABNORMAL HIGH (ref 65–99)
POTASSIUM: 4.2 mmol/L (ref 3.5–5.1)
SODIUM: 134 mmol/L — AB (ref 135–145)

## 2016-08-19 LAB — GLUCOSE, CAPILLARY
GLUCOSE-CAPILLARY: 381 mg/dL — AB (ref 65–99)
Glucose-Capillary: 240 mg/dL — ABNORMAL HIGH (ref 65–99)
Glucose-Capillary: 211 mg/dL — ABNORMAL HIGH (ref 65–99)
Glucose-Capillary: 194 mg/dL — ABNORMAL HIGH (ref 65–99)

## 2016-08-19 LAB — URIC ACID: Uric Acid, Serum: 8.6 mg/dL — ABNORMAL HIGH (ref 2.3–6.6)

## 2016-08-19 LAB — LACTIC ACID, PLASMA: Lactic Acid, Venous: 1.1 mmol/L (ref 0.5–1.9)

## 2016-08-19 MED ORDER — ALPRAZOLAM 0.5 MG PO TABS
0.5000 mg | ORAL_TABLET | Freq: Four times a day (QID) | ORAL | Status: DC | PRN
  Administered 2016-08-20: 0.5 mg via ORAL
  Filled 2016-08-19: qty 1

## 2016-08-19 MED ORDER — INSULIN GLARGINE 100 UNIT/ML ~~LOC~~ SOLN
10.0000 [IU] | Freq: Every day | SUBCUTANEOUS | Status: DC
  Administered 2016-08-19 – 2016-08-20 (×2): 10 [IU] via SUBCUTANEOUS
  Filled 2016-08-19 (×2): qty 0.1

## 2016-08-19 MED ORDER — GLUCERNA SHAKE PO LIQD
237.0000 mL | Freq: Three times a day (TID) | ORAL | Status: DC
  Administered 2016-08-19 – 2016-08-21 (×6): 237 mL via ORAL
  Filled 2016-08-19 (×8): qty 237

## 2016-08-19 MED ORDER — TRAMADOL HCL 50 MG PO TABS
100.0000 mg | ORAL_TABLET | Freq: Four times a day (QID) | ORAL | Status: DC | PRN
  Administered 2016-08-19 – 2016-08-20 (×4): 100 mg via ORAL
  Filled 2016-08-19 (×4): qty 2

## 2016-08-19 MED ORDER — COLCHICINE 0.6 MG PO TABS
0.6000 mg | ORAL_TABLET | Freq: Two times a day (BID) | ORAL | Status: DC
  Administered 2016-08-19 – 2016-08-21 (×5): 0.6 mg via ORAL
  Filled 2016-08-19 (×5): qty 1

## 2016-08-19 NOTE — Progress Notes (Signed)
Inpatient Diabetes Program Recommendations  AACE/ADA: New Consensus Statement on Inpatient Glycemic Control (2015)  Target Ranges:  Prepandial:   less than 140 mg/dL      Peak postprandial:   less than 180 mg/dL (1-2 hours)      Critically ill patients:  140 - 180 mg/dL   Results for BLONNIE, MASKE (MRN [id number]) as of 08/19/2016 09:51  Ref. Range 08/18/2016 13:16 08/18/2016 16:36 08/18/2016 22:25 08/19/2016 08:03  Glucose-Capillary Latest Ref Range: 65 - 99 mg/dL 178 (H) 390 (H) 337 (H) 381 (H)   Review of Glycemic Control  Diabetes history: DM 2 Diet Controlled Current orders for Inpatient glycemic control: IV Insulin  Inpatient Diabetes Program Recommendations:   Hyperglycemia in the 300's. Basal insulin not given and overlapped 2 hours with IV insulin per protocol. Only correction administered. Lantus given this am. If patient continues to have elevations, please refer to DM coordinators note from yesterday 3/24.  Thanks,  Tama Headings RN, MSN, [facility] Inpatient Diabetes Coordinator Team Pager [phone] (8a-5p)

## 2016-08-19 NOTE — Progress Notes (Signed)
**  Preliminary report by tech**  Right lower extremity venous duplex complete. There is no obvious evidence of deep or superficial vein thrombosis involving the right lower extremity. All clearly visualized vessels appear patent and compressible. There is no evidence of a Baker's cyst on the right.  08/19/16 10:56 AM [name] RVT

## 2016-08-19 NOTE — Progress Notes (Signed)
Initial Nutrition Assessment  DOCUMENTATION CODES:   Obesity unspecified  INTERVENTION:   Provide Glucerna Shake po TID, each supplement provides 220 kcal and 10 grams of protein Encourage PO intake RD to continue to monitor  NUTRITION DIAGNOSIS:   Inadequate oral intake related to nausea, vomiting, other (see comment) (pain) as evidenced by meal completion < 50%.  GOAL:   Patient will meet greater than or equal to 90% of their needs  MONITOR:   PO intake, Supplement acceptance, Labs, Weight trends, I & O's  REASON FOR ASSESSMENT:   Consult Assessment of nutrition requirement/status  ASSESSMENT:   66 year old female with past medical history of essential hypertension, iron deficiency anemia from chronic blood loss from AVMs, hepatic cirrhosis and diet-controlled diabetes who overall has had quite good control of her diabetes (A1c in October was 5.5) and in fact, patient's PCP stopped her metformin around that time. Patient states she's overall been in good health but then in the last month, she's had episodes with nausea and vomiting so for the last month she has only drank ensure and eaten no food.  She denies any issues with coughing, urinary frequency or fevers. Patient was at home by herself, but with family nearby. Family brought patient on afternoon of 3/23 for confusion-patient was mumbling, delirious.  Pt in room with no nurse tech at bedside. Pt c/o extreme leg pain. Per nurse tech, pt was crying out prior to visit. Pt has not eaten anything this morning d/t pain levels. Pt states she was consuming 2-3 Ensures a day (providing 500-750 kcal and 18-27g protein) and nothing else for a month PTA. Pt had N/V during that time. Yesterday, pt consumed 45% of a meal.  RD will provide Glucerna shakes given pt's continued elevated CBGs.  Per chart review, pt's weights have ranged from 172-211 lb over the past month. Suspect some fluid accumulation. Nutrition focused physical exam  shows no sign of depletion of muscle mass or body fat.  Medications: Ferrous sulfate tablet daily Labs reviewed: CBGs: 240-381 Low Na GFR: 50  Diet Order:  Diet Carb Modified Fluid consistency: Thin; Room service appropriate? Yes  Skin:  Wound (see comment) (3/23 abdominal incision)  Last BM:  PTA  Height:   Ht Readings from Last 1 Encounters:  08/17/16 5\' 3"  (1.6 m)    Weight:   Wt Readings from Last 1 Encounters:  08/17/16 180 lb 8.9 oz (81.9 kg)    Ideal Body Weight:  52.3 kg  BMI:  Body mass index is 31.98 kg/m.  Estimated Nutritional Needs:   Kcal:  1500-1700  Protein:  70-80g  Fluid:  1.7L/day  EDUCATION NEEDS:   Education needs no appropriate at this time  Clayton Bibles, MS, RD, LDN Pager: [phone] After Hours Pager: [phone]

## 2016-08-19 NOTE — Progress Notes (Signed)
PROGRESS NOTE  [name] MEQ:[id number] DOB: Dec 11, 1950 DOA: 08/17/2016 PCP: [name], MD  HPI/Recap of past 24 hours: He actually 66 year old female with past medical history of essential hypertension, iron deficiency anemia from chronic blood loss from AVMs, hepatic cirrhosis and diet-controlled diabetes who overall has had quite good control of her diabetes (A1c in October was 5.5) and in fact, patient's PCP stopped her metformin around that time. Patient states she's overall been in good health but then in the last month, she's had episodes with nausea and vomiting so for the last month she has only drank ensure and eaten no food.  She denies any issues with coughing, urinary frequency or fevers. Patient was at home by herself, but with family nearby. Family brought patient on afternoon of 3/23 for confusion-patient was mumbling, delirious.  In the emergency room, patient found to have sugars greater than 1000 and she was severely volume depleted. Systolic blood pressure in the 60s. Patient received multiple multiple fluid boluses as well as started on an insulin drip. She was not in DKA. White count was only minimally elevated at 12.3 and pro-calcitonin level normal. Lactic acid level elevated at 2.2, although this was felt to be more related to severe volume depletion. Hospitalists were called for further evaluation and admission. Over the next 24 hours, sugars came down to normal range and patient was taken off of insulin drip. She was fully alert and oriented.    Overnight, patient complaining of right leg pain which she describes as mostly in the anterior aspect of her right knee but also some down into her calf and going down into her heel. Chest complains of numbness in her right foot. She describes these pain as a 10/10 and continuous. Lower extremity Doppler negative for blood clot or cyst as well as 4 view knee x-ray done negative for effusion only noting osteoarthrosis. Patient  has a history of gout see uric acid level checked and found to be elevated at 8.6.  Assess  ment/Plan: Active Problems:   Essential hypertension, benign: Stable.    Well controlled type 2 diabetes mellitus with peripheral neuropathy ([place]) who presented with nonketotic hyperosmolar hyperglycemia and secondary acute kidney injury: I suspect the patient did not have infection which that this soft, rather being on diet control only and drinking ensure led to severe hyperglycemia and then secondary diuresis and volume depletion. Still waiting for A1c to come back. Sugars trending upward, so started on Lantus this morning.Burnis Medin continue aggressive volume resuscitation given that her blood pressure still soft and kidney function not yet back to normal.    Iron deficiency anemia due to chronic blood loss: Hemoglobin on admission at 12.6 however this is likely hemoconcentrated and hemoglobin on 3/24 at 9.0.    Hepatic cirrhosis ([place]): Stable  Nausea and vomiting: Patient has been on ensure for the last 1 month. Portable abdominal x-ray unrevealing. May have been because of elevated blood sugars? She's had a number of abdominal surgeries    Hypotension: Secondary to hyperglycemia. Improving with fluid resuscitation    Hyperosmolar non-ketotic state in patient with type 2 diabetes mellitus ([place])    Acute encephalopathy: Secondary to nonketotic hyperglycemia. Resolved.   Acute knee pain with extension down to the leg.? Gout versus osteoarthrosis. No evidence of joint effusion or blood clot. Restarted patient's colchicine when necessary Ultram for pain.    Code Status:full code  Family Communication: left message for family  Disposition Plan: Stable. Expect discharge tomorrow once  sugars stabilized and plan in place   Consultants:  None     Procedures  Right lower extremity Doppler done 3/25: Normal   Antimicrobials:  None    DVT prophylaxis:  Lovenox   Objective: Vitals:    08/18/16 1200 08/18/16 1500 08/18/16 2230 08/19/16 0515  BP:  102/62 (!) 153/78 132/84  Pulse:  76 68 98  Resp:  18 20 20   Temp: 98.2 F (36.8 C)  98.6 F (37 C) 98.9 F (37.2 C)  TempSrc: Oral  Oral Oral  SpO2:  97% 96% 99%  Weight:      Height:        Intake/Output Summary (Last 24 hours) at 08/19/16 1257 Last data filed at 08/19/16 0520  Gross per 24 hour  Intake             2505 ml  Output             2995 ml  Net             -490 ml   Filed Weights   08/17/16 1838 08/17/16 2300  Weight: 90.7 kg (200 lb) 81.9 kg (180 lb 8.9 oz)    Exam:   General:  Alert and oriented 3, Moderate distress secondary to pain.  Cardiovascular: regular rate and rhythm, S1-S2    Respiratory: clear to auscultation bilaterally    Abdomen: soft, nontender, nondistended, hypoactive bowel sounds. Patient has multiple scars and deformity in her abdomen following previous surgeries for diverticulosis and hernia repair    Musculoskeletal: status post left BKA, Right lower extremity with some mild tenderness in the lower quadrant of the right knee as well as right calf. No clubbing or cyanosis or edema  Skin: No skin breaks tears or lesions other than described above in abdominal area   Psychiatry: patient is appropriate, no evidence of psychoses     Data Reviewed: CBC:  Recent Labs Lab 08/17/16 1845 08/17/16 1850 08/18/16 0330  WBC 12.3*  --  10.7*  NEUTROABS 9.8*  --   --   HGB 11.1* 12.6 9.0*  HCT 32.6* 37.0 25.0*  MCV 85.8  --  82.8  PLT 392  --  229   Basic Metabolic Panel:  Recent Labs Lab 08/17/16 2207 08/18/16 0137 08/18/16 0330 08/18/16 0954 08/19/16 0521  NA 122* 131* 133* 135 134*  K 4.7 3.7 3.5 3.7 4.2  CL 87* 97* 102 104 106  CO2 23 25 26 25 22   GLUCOSE 813* 299* 221* 126* 349*  BUN 53* 52* 52* 45* 30*  CREATININE 2.13* 2.45* 2.04* 1.63* 1.29*  CALCIUM 9.2 8.8* 8.0* 8.1* 8.3*  MG  --   --  2.0  --   --   PHOS  --   --  3.7  --   --    GFR: Estimated  Creatinine Clearance: 45.2 mL/min (A) (by C-G formula based on SCr of 1.29 mg/dL (H)). Liver Function Tests:  Recent Labs Lab 08/17/16 1845 08/18/16 0330  AST 17 14*  ALT 15 12*  ALKPHOS 97 66  BILITOT 0.7 0.4  PROT 8.7* 6.3*  ALBUMIN 4.3 3.2*   No results for input(s): LIPASE, AMYLASE in the last 168 hours.  Recent Labs Lab 08/17/16 2207  AMMONIA 26   Coagulation Profile:  Recent Labs Lab 08/17/16 2206  INR 1.05   Cardiac Enzymes:  Recent Labs Lab 08/17/16 1845 08/17/16 2206 08/18/16 0330 08/18/16 0954  TROPONINI <0.03 <0.03 <0.03 <0.03   BNP (last 3 results) No  results for input(s): PROBNP in the last 8760 hours. HbA1C: No results for input(s): HGBA1C in the last 72 hours. CBG:  Recent Labs Lab 08/18/16 1316 08/18/16 1636 08/18/16 2225 08/19/16 0803 08/19/16 1123  GLUCAP 178* 390* 337* 381* 240*   Lipid Profile: No results for input(s): CHOL, HDL, LDLCALC, TRIG, CHOLHDL, LDLDIRECT in the last 72 hours. Thyroid Function Tests:  Recent Labs  08/18/16 0330  TSH 0.338*   Anemia Panel: No results for input(s): VITAMINB12, FOLATE, FERRITIN, TIBC, IRON, RETICCTPCT in the last 72 hours. Urine analysis:    Component Value Date/Time   COLORURINE STRAW (A) 08/17/2016 2148   APPEARANCEUR CLEAR 08/17/2016 2148   LABSPEC 1.022 08/17/2016 2148   PHURINE 5.0 08/17/2016 2148   GLUCOSEU >=500 (A) 08/17/2016 2148   HGBUR SMALL (A) 08/17/2016 2148   BILIRUBINUR NEGATIVE 08/17/2016 2148   KETONESUR 5 (A) 08/17/2016 2148   PROTEINUR 30 (A) 08/17/2016 2148   UROBILINOGEN 1.0 10/24/2014 1555   NITRITE NEGATIVE 08/17/2016 2148   LEUKOCYTESUR NEGATIVE 08/17/2016 2148   Sepsis Labs: @LABRCNTIP (procalcitonin:4,lacticidven:4)  ) Recent Results (from the past 240 hour(s))  MRSA PCR Screening     Status: None   Collection Time: 08/17/16 10:52 PM  Result Value Ref Range Status   MRSA by PCR NEGATIVE NEGATIVE Final    Comment:        The GeneXpert MRSA Assay  (FDA approved for NASAL specimens only), is one component of a comprehensive MRSA colonization surveillance program. It is not intended to diagnose MRSA infection nor to guide or monitor treatment for MRSA infections.       Studies: Dg Knee Complete 4 Views Right  Result Date: 08/19/2016 CLINICAL DATA:  Pain EXAM: RIGHT KNEE - COMPLETE 4+ VIEW COMPARISON:  February 22, 2015 FINDINGS: Frontal, lateral, and bilateral oblique views were obtained. There is mild generalized soft tissue swelling. No fracture or dislocation. No joint effusion. There is moderately severe joint space narrowing medially and in the patellofemoral joint region. There is spurring in all compartments, most notably medially and in the patellofemoral joint regions. No erosive change. IMPRESSION: Osteoarthritic change, most marked medially and in the patellofemoral joint regions. No fracture or joint effusion. Electronically Signed   By: Lowella Grip III M.D.   On: 08/19/2016 10:32    Scheduled Meds: . aspirin EC  81 mg Oral Daily  . colchicine  0.6 mg Oral BID  . enoxaparin (LOVENOX) injection  40 mg Subcutaneous QHS  . feeding supplement (GLUCERNA SHAKE)  237 mL Oral TID BM  . ferrous sulfate  325 mg Oral QPC breakfast  . gabapentin  800 mg Oral TID  . insulin aspart  0-15 Units Subcutaneous TID WC  . insulin aspart  0-5 Units Subcutaneous QHS  . insulin glargine  10 Units Subcutaneous Daily  . loratadine  10 mg Oral Daily  . sodium chloride flush  3 mL Intravenous Q12H    Continuous Infusions: . sodium chloride 125 mL/hr (08/18/16 2321)     LOS: 2 days     Annita Brod, MD [facility] Pager [phone]  If 7PM-7AM, please contact night-coverage [email or web address] Password [facility] 08/19/2016, 12:57 PM

## 2016-08-20 ENCOUNTER — Telehealth: Payer: Self-pay | Admitting: Internal Medicine

## 2016-08-20 DIAGNOSIS — M109 Gout, unspecified: Secondary | ICD-10-CM

## 2016-08-20 LAB — HEMOGLOBIN A1C
Hgb A1c MFr Bld: >15.5 % — ABNORMAL HIGH (ref 4.8–5.6)
Mean Plasma Glucose: >398 mg/dL

## 2016-08-20 LAB — GLUCOSE, CAPILLARY
GLUCOSE-CAPILLARY: 194 mg/dL — AB (ref 65–99)
Glucose-Capillary: 297 mg/dL — ABNORMAL HIGH (ref 65–99)
Glucose-Capillary: 330 mg/dL — ABNORMAL HIGH (ref 65–99)
Glucose-Capillary: 279 mg/dL — ABNORMAL HIGH (ref 65–99)
Glucose-Capillary: 243 mg/dL — ABNORMAL HIGH (ref 65–99)

## 2016-08-20 LAB — BASIC METABOLIC PANEL
Anion gap: 7 (ref 5–15)
BUN: 14 mg/dL (ref 6–20)
CHLORIDE: 106 mmol/L (ref 101–111)
CO2: 21 mmol/L — AB (ref 22–32)
CREATININE: 0.96 mg/dL (ref 0.44–1.00)
Calcium: 8.2 mg/dL — ABNORMAL LOW (ref 8.9–10.3)
GFR calc Af Amer: >60 mL/min (ref >60)
GFR calc non Af Amer: >60 mL/min (ref >60)
GLUCOSE: 265 mg/dL — AB (ref 65–99)
Potassium: 3.9 mmol/L (ref 3.5–5.1)
Sodium: 134 mmol/L — ABNORMAL LOW (ref 135–145)

## 2016-08-20 LAB — CBC
HCT: 27.3 % — ABNORMAL LOW (ref 36.0–46.0)
Hemoglobin: 9.3 g/dL — ABNORMAL LOW (ref 12.0–15.0)
MCH: 29.2 pg (ref 26.0–34.0)
MCHC: 34.1 g/dL (ref 30.0–36.0)
MCV: 85.6 fL (ref 78.0–100.0)
PLATELETS: 293 10*3/uL (ref 150–400)
RBC: 3.19 MIL/uL — AB (ref 3.87–5.11)
RDW: 12.9 % (ref 11.5–15.5)
WBC: 7.3 10*3/uL (ref 4.0–10.5)

## 2016-08-20 MED ORDER — FLUOXETINE HCL 20 MG PO CAPS
40.0000 mg | ORAL_CAPSULE | Freq: Every day | ORAL | Status: DC
  Administered 2016-08-20 – 2016-08-21 (×2): 40 mg via ORAL
  Filled 2016-08-20 (×2): qty 2

## 2016-08-20 MED ORDER — FAMOTIDINE 20 MG PO TABS
20.0000 mg | ORAL_TABLET | Freq: Two times a day (BID) | ORAL | Status: DC | PRN
  Administered 2016-08-20: 20 mg via ORAL
  Filled 2016-08-20: qty 1

## 2016-08-20 MED ORDER — INSULIN GLARGINE 100 UNIT/ML ~~LOC~~ SOLN
5.0000 [IU] | SUBCUTANEOUS | Status: AC
  Administered 2016-08-20: 5 [IU] via SUBCUTANEOUS
  Filled 2016-08-20: qty 0.05

## 2016-08-20 MED ORDER — ALUM & MAG HYDROXIDE-SIMETH 200-200-20 MG/5ML PO SUSP
30.0000 mL | ORAL | Status: DC | PRN
  Administered 2016-08-20 – 2016-08-21 (×3): 30 mL via ORAL
  Filled 2016-08-20 (×3): qty 30

## 2016-08-20 MED ORDER — INSULIN GLARGINE 100 UNIT/ML ~~LOC~~ SOLN
13.0000 [IU] | Freq: Every day | SUBCUTANEOUS | Status: DC
  Administered 2016-08-21: 13 [IU] via SUBCUTANEOUS
  Filled 2016-08-20: qty 0.13

## 2016-08-20 MED ORDER — ATENOLOL 50 MG PO TABS
50.0000 mg | ORAL_TABLET | Freq: Every morning | ORAL | Status: DC
  Administered 2016-08-20 – 2016-08-21 (×2): 50 mg via ORAL
  Filled 2016-08-20 (×2): qty 1

## 2016-08-20 MED ORDER — LOSARTAN POTASSIUM 50 MG PO TABS
50.0000 mg | ORAL_TABLET | Freq: Every day | ORAL | Status: DC
  Administered 2016-08-20 – 2016-08-21 (×2): 50 mg via ORAL
  Filled 2016-08-20 (×2): qty 1

## 2016-08-20 NOTE — Progress Notes (Signed)
PT Cancellation Note  Patient Details Name: [name] MRN: [id number] DOB: 1951-01-12   Cancelled Treatment:    Reason Eval/Treat Not Completed: Medical issues which prohibited therapy. C/o N/V recently.  Check back tomorrow.    Claretha Cooper 08/20/2016, 5:28 PM Tresa Endo PT [phone]

## 2016-08-20 NOTE — Progress Notes (Signed)
PROGRESS NOTE  [name] ZSW:[id number] DOB: 02-Nov-1950 DOA: 08/17/2016 PCP: [name], MD  HPI/Recap of past 24 hours: He actually 66 year old female with past medical history of essential hypertension, iron deficiency anemia from chronic blood loss from AVMs, hepatic cirrhosis and diet-controlled diabetes who overall has had quite good control of her diabetes (A1c in October was 5.5) and in fact, patient's PCP stopped her metformin around that time. Patient states she's overall been in good health but then in the last month, she's had episodes with nausea and vomiting so for the last month she has only drank ensure and eaten no food.  She denies any issues with coughing, urinary frequency or fevers. Patient was at home by herself, but with family nearby. Family brought patient on afternoon of 3/23 for confusion-patient was mumbling, delirious.  In the emergency room, patient found to have sugars greater than 1000 and she was severely volume depleted. Systolic blood pressure in the 60s. Patient received multiple multiple fluid boluses as well as started on an insulin drip. She was not in DKA. White count was only minimally elevated at 12.3 and pro-calcitonin level normal. Lactic acid level elevated at 2.2, although this was felt to be more related to severe volume depletion. Hospitalists were called for further evaluation and admission. Over the next 24 hours, sugars came down to normal range and patient was taken off of insulin drip. She was fully alert and oriented.    Overnight, patient complaining of right leg 10/10 continuous pain which she describes as mostly in the anterior aspect of her right knee but also some down into her calf and going down into her heel. Chest complains of numbness in her right foot. Knee x-ray and lower extremity Doppler normal. Elevated serum uric acid level 8.6. Patient started on colchicine which has helped improve her pain. CBG still elevated and A1c back at  greater than 15.5.  Patient feeling somewhat better. Less leg pain.  Assess  ment/Plan: Active Problems:   Essential hypertension, benign: Stable. We will restart antihypertensives except for HCTZ which can exacerbate her gout.  Previously well controlled type 2 diabetes mellitus with peripheral neuropathy ([place]) who presented with nonketotic hyperosmolar hyperglycemia and secondary acute kidney injury: I suspect the patient did not have infection which that this soft, rather being on diet control only and drinking ensure led to severe hyperglycemia and then secondary diuresis and volume depletion. A1c greater than 15.5. Granted much of this is likely from ensure shakes, so we'll start insulin and that now that she is changed over to Glucerna, she will not need as aggressive control. Close follow-up however is vital. Renal function normalized and so we'll stop IV fluids.    Iron deficiency anemia due to chronic blood loss: Hemoglobin on admission at 12.6 however this is likely hemoconcentrated and hemoglobin on 3/24 at 9.0.    Hepatic cirrhosis ([place]): Stable  Nausea and vomiting: Patient has been on ensure for the last 1 month. Portable abdominal x-ray unrevealing. May have been because of elevated blood sugars? She's had a number of abdominal surgeries    Hypotension: Secondary to hyperglycemia. Resolved with fluid resuscitation    Hyperosmolar non-ketotic state in patient with type 2 diabetes mellitus ([place])    Acute encephalopathy: Secondary to nonketotic hyperglycemia. Resolved.   Acute knee pain with extension down to the leg.? Gout versus osteoarthrosis. No evidence of joint effusion or blood clot. Restarted patient's colchicine when necessary Ultram for pain.    Code Status:full  code  Family Communication: left message for family  Disposition Plan: Plan for discharge tomorrow with home health.   Consultants:  None     Procedures  Right lower extremity Doppler done 3/25:  Normal   Antimicrobials:  None    DVT prophylaxis:  Lovenox   Objective: Vitals:   08/19/16 1728 08/19/16 2153 08/20/16 0053 08/20/16 0449  BP: (!) 142/80 (!) 158/72 120/79 (!) 145/84  Pulse: 86 81 80 96  Resp: 18 18 18 18   Temp:  98.4 F (36.9 C) 98.4 F (36.9 C) 98.5 F (36.9 C)  TempSrc:  Oral Oral Oral  SpO2: 100% 97% 97% 98%  Weight:      Height:        Intake/Output Summary (Last 24 hours) at 08/20/16 1117 Last data filed at 08/20/16 0450  Gross per 24 hour  Intake             1465 ml  Output             4350 ml  Net            -2885 ml   Filed Weights   08/17/16 1838 08/17/16 2300  Weight: 90.7 kg (200 lb) 81.9 kg (180 lb 8.9 oz)    Exam:   General:  Alert and oriented 3, No acute distress  Cardiovascular: regular rate and rhythm, S1-S2    Respiratory: clear to auscultation bilaterally    Abdomen: soft, nontender, nondistended, hypoactive bowel sounds. (Patient has multiple scars and deformity in her abdomen following previous surgeries for diverticulosis and hernia repair )  Musculoskeletal: status post left BKA, 1+ pitting edema for right lower extremity  Skin: No skin breaks tears or lesions other than described above in abdominal area   Psychiatry: patient is appropriate, no evidence of psychoses     Data Reviewed: CBC:  Recent Labs Lab 08/17/16 1845 08/17/16 1850 08/18/16 0330 08/20/16 0507  WBC 12.3*  --  10.7* 7.3  NEUTROABS 9.8*  --   --   --   HGB 11.1* 12.6 9.0* 9.3*  HCT 32.6* 37.0 25.0* 27.3*  MCV 85.8  --  82.8 85.6  PLT 392  --  274 299   Basic Metabolic Panel:  Recent Labs Lab 08/18/16 0137 08/18/16 0330 08/18/16 0954 08/19/16 0521 08/20/16 0507  NA 131* 133* 135 134* 134*  K 3.7 3.5 3.7 4.2 3.9  CL 97* 102 104 106 106  CO2 25 26 25 22  21*  GLUCOSE 299* 221* 126* 349* 265*  BUN 52* 52* 45* 30* 14  CREATININE 2.45* 2.04* 1.63* 1.29* 0.96  CALCIUM 8.8* 8.0* 8.1* 8.3* 8.2*  MG  --  2.0  --   --   --   PHOS   --  3.7  --   --   --    GFR: Estimated Creatinine Clearance: 60.8 mL/min (by C-G formula based on SCr of 0.96 mg/dL). Liver Function Tests:  Recent Labs Lab 08/17/16 1845 08/18/16 0330  AST 17 14*  ALT 15 12*  ALKPHOS 97 66  BILITOT 0.7 0.4  PROT 8.7* 6.3*  ALBUMIN 4.3 3.2*   No results for input(s): LIPASE, AMYLASE in the last 168 hours.  Recent Labs Lab 08/17/16 2207  AMMONIA 26   Coagulation Profile:  Recent Labs Lab 08/17/16 2206  INR 1.05   Cardiac Enzymes:  Recent Labs Lab 08/17/16 1845 08/17/16 2206 08/18/16 0330 08/18/16 0954  TROPONINI <0.03 <0.03 <0.03 <0.03   BNP (last 3 results) No results  for input(s): PROBNP in the last 8760 hours. HbA1C:  Recent Labs  08/18/16 0330  HGBA1C >15.5*   CBG:  Recent Labs Lab 08/19/16 0803 08/19/16 1123 08/19/16 1655 08/19/16 2140 08/20/16 0747  GLUCAP 381* 240* 211* 194* 297*   Lipid Profile: No results for input(s): CHOL, HDL, LDLCALC, TRIG, CHOLHDL, LDLDIRECT in the last 72 hours. Thyroid Function Tests:  Recent Labs  08/18/16 0330  TSH 0.338*   Anemia Panel: No results for input(s): VITAMINB12, FOLATE, FERRITIN, TIBC, IRON, RETICCTPCT in the last 72 hours. Urine analysis:    Component Value Date/Time   COLORURINE STRAW (A) 08/17/2016 2148   APPEARANCEUR CLEAR 08/17/2016 2148   LABSPEC 1.022 08/17/2016 2148   PHURINE 5.0 08/17/2016 2148   GLUCOSEU >=500 (A) 08/17/2016 2148   HGBUR SMALL (A) 08/17/2016 2148   BILIRUBINUR NEGATIVE 08/17/2016 2148   KETONESUR 5 (A) 08/17/2016 2148   PROTEINUR 30 (A) 08/17/2016 2148   UROBILINOGEN 1.0 10/24/2014 1555   NITRITE NEGATIVE 08/17/2016 2148   LEUKOCYTESUR NEGATIVE 08/17/2016 2148   Sepsis Labs: @LABRCNTIP (procalcitonin:4,lacticidven:4)  ) Recent Results (from the past 240 hour(s))  Culture, blood (Routine X 2) w Reflex to ID Panel     Status: None (Preliminary result)   Collection Time: 08/17/16 10:02 PM  Result Value Ref Range Status     Specimen Description BLOOD LEFT HAND  Final   Special Requests BOTTLES DRAWN AEROBIC AND ANAEROBIC 5ML EA  Final   Culture   Final    NO GROWTH 1 DAY Performed at [facility], [place] [address], [place], [place] [address]    Report Status PENDING  Incomplete  Culture, blood (Routine X 2) w Reflex to ID Panel     Status: None (Preliminary result)   Collection Time: 08/17/16 10:07 PM  Result Value Ref Range Status   Specimen Description BLOOD RIGHT ANTECUBITAL  Final   Special Requests BOTTLES DRAWN AEROBIC AND ANAEROBIC 5ML EA  Final   Culture   Final    NO GROWTH 1 DAY Performed at [facility], [place] [address] [place], [place] [address]    Report Status PENDING  Incomplete  MRSA PCR Screening     Status: None   Collection Time: 08/17/16 10:52 PM  Result Value Ref Range Status   MRSA by PCR NEGATIVE NEGATIVE Final    Comment:        The GeneXpert MRSA Assay (FDA approved for NASAL specimens only), is one component of a comprehensive MRSA colonization surveillance program. It is not intended to diagnose MRSA infection nor to guide or monitor treatment for MRSA infections.       Studies: No results found.  Scheduled Meds: . aspirin EC  81 mg Oral Daily  . atenolol  50 mg Oral q morning - 10a  . colchicine  0.6 mg Oral BID  . enoxaparin (LOVENOX) injection  40 mg Subcutaneous QHS  . feeding supplement (GLUCERNA SHAKE)  237 mL Oral TID BM  . ferrous sulfate  325 mg Oral QPC breakfast  . FLUoxetine  40 mg Oral Daily  . gabapentin  800 mg Oral TID  . insulin aspart  0-15 Units Subcutaneous TID WC  . insulin aspart  0-5 Units Subcutaneous QHS  . [START ON 08/21/2016] insulin glargine  13 Units Subcutaneous Daily  . insulin glargine  5 Units Subcutaneous NOW  . loratadine  10 mg Oral Daily  . losartan  50 mg Oral Daily  . sodium chloride flush  3 mL  Intravenous Q12H    Continuous Infusions: . sodium chloride 125 mL/hr at 08/19/16 2142     LOS: 3 days      Annita Brod, MD [facility] Pager [phone]  If 7PM-7AM, please contact night-coverage [email or web address] Password [facility] 08/20/2016, 11:17 AM

## 2016-08-20 NOTE — Progress Notes (Signed)
OT Cancellation Note  Patient Details Name: [name] MRN: [id number] DOB: 1950/08/05   Cancelled Treatment:    Reason Eval/Treat Not Completed: Other (comment) Pt not willing to get OOB at this time as she is not feeling well. She states her stomach is sour and she is waiting on MD  Kari Baars, [place] Payton Mccallum D 08/20/2016, 2:27 PM

## 2016-08-20 NOTE — Telephone Encounter (Signed)
A. REMINDER CALL, NO ANSWER, NO VOICEMAIL

## 2016-08-20 NOTE — Care Management Note (Signed)
Case Management Note  Patient Details  Name: [name] MRN: [id number] Date of Birth: 06-13-1950  Subjective/Objective: PT-recc HHPT. Ordered for HHPT/HHOT-provided patient w/HHC agency list for choice-chose [facility]-rep [place] following-aware of [place] orders. Await d/c order.                    Action/Plan:d/c plan home w/HHC.   Expected Discharge Date:   (unknown)               Expected Discharge Plan:  [place]  In-House Referral:     Discharge planning Services  CM Consult  Post Acute Care Choice:    Choice offered to:  Patient  DME Arranged:    DME Agency:     HH Arranged:  PT, OT HH Agency:  [facility]  Status of Service:  In process, will continue to follow  If discussed at Long Length of Stay Meetings, dates discussed:    Additional Comments:  Dessa Phi, RN 08/20/2016, 3:39 PM

## 2016-08-21 ENCOUNTER — Encounter: Payer: Medicare Other | Admitting: Internal Medicine

## 2016-08-21 DIAGNOSIS — E1165 Type 2 diabetes mellitus with hyperglycemia: Secondary | ICD-10-CM

## 2016-08-21 DIAGNOSIS — N182 Chronic kidney disease, stage 2 (mild): Secondary | ICD-10-CM

## 2016-08-21 DIAGNOSIS — E1122 Type 2 diabetes mellitus with diabetic chronic kidney disease: Secondary | ICD-10-CM

## 2016-08-21 LAB — GLUCOSE, CAPILLARY
Glucose-Capillary: 283 mg/dL — ABNORMAL HIGH (ref 65–99)
Glucose-Capillary: 314 mg/dL — ABNORMAL HIGH (ref 65–99)

## 2016-08-21 MED ORDER — METFORMIN HCL 1000 MG PO TABS: 1000.0000 mg | ORAL_TABLET | Freq: Two times a day (BID) | ORAL | 2 refills | Status: DC

## 2016-08-21 MED ORDER — GLUCERNA SHAKE PO LIQD: 237.0000 mL | Freq: Three times a day (TID) | ORAL | 0 refills | Status: DC

## 2016-08-21 MED ORDER — COLCHICINE 0.6 MG PO TABS: 0.6000 mg | ORAL_TABLET | Freq: Two times a day (BID) | ORAL | 2 refills | Status: DC

## 2016-08-21 MED ORDER — INSULIN STARTER KIT- SYRINGES (ENGLISH)
1.0000 | Freq: Once | Status: AC
  Administered 2016-08-21: 1
  Filled 2016-08-21: qty 1

## 2016-08-21 MED FILL — COLCHICINE 0.6 MG TABLET: 0.6 | 7 days supply | Qty: 14 | Fill #0 | Status: TO

## 2016-08-21 MED FILL — metFORMIN HCL 1000 MG TABS: 1000 | 30 days supply | Qty: 60 | Fill #0 | Status: TO

## 2016-08-21 NOTE — Progress Notes (Signed)
OT Cancellation Note  Patient Details Name: [name] MRN: [id number] DOB: 04-02-1951   Cancelled Treatment:    Reason Eval/Treat Not Completed: Other (comment)   Pt plans d/c home today. She states she has no concerns caring for herself and that she will have [place] therapy.  Pt transferred with PT at supervision level. Shamra Bradeen 08/21/2016, 1:32 PM  Lesle Chris, OTR/L [phone] 08/21/2016

## 2016-08-21 NOTE — Care Management Important Message (Signed)
Important Message  Patient Details  Name: EMIAH PELLICANO MRN: [id number] Date of Birth: May 05, 1951   Medicare Important Message Given:  Yes    Kerin Salen 08/21/2016, 10:02 AMImportant Message  Patient Details  Name: MELAT WRISLEY MRN: [id number] Date of Birth: 1951/04/17   Medicare Important Message Given:  Yes    Kerin Salen 08/21/2016, 10:02 AM

## 2016-08-21 NOTE — Care Management Note (Signed)
Case Management Note  Patient Details  Name: [name] MRN: [id number] Date of Birth: 1950/08/25  Subjective/Objective:[facility] [place] able to accept-aware of orders,& d/c today.Patient already has personal care services ongoing. No further CM needs.                   Action/Plan:d/c home w/HHC.   Expected Discharge Date:   (unknown)               Expected Discharge Plan:  [place]  In-House Referral:     Discharge planning Services  CM Consult  Post Acute Care Choice:    Choice offered to:  Patient  DME Arranged:    DME Agency:     HH Arranged:  PT, OT HH Agency:  [facility]  Status of Service:  Completed, signed off  If discussed at [place] of Stay Meetings, dates discussed:    Additional Comments:  Dessa Phi, RN 08/21/2016, 10:46 AM

## 2016-08-21 NOTE — Progress Notes (Signed)
Inpatient Diabetes Program Recommendations  AACE/ADA: New Consensus Statement on Inpatient Glycemic Control (2015)  Target Ranges:  Prepandial:   less than 140 mg/dL      Peak postprandial:   less than 180 mg/dL (1-2 hours)      Critically ill patients:  140 - 180 mg/dL   Lab Results  Component Value Date   GLUCAP 283 (H) 08/21/2016   HGBA1C >15.5 (H) 08/18/2016    Review of Glycemic Control  Diabetes history: DM2 Outpatient Diabetes medications: None Current orders for Inpatient glycemic control: Lantus 13 units QAM, Novolog 0-15 units tidwc and hs  Inpatient Diabetes Program Recommendations:    Lantus 15 units QAM Novolog 4 units tidwc for meal coverage insulin  Will order insulin starter kit and RN to begin teaching insulin administration. Will bring insulin pen to demonstrate this afternoon. RN to allow pt to give her own insulin injection at lunch.   Will follow. Thank you. Lorenda Peck, RD, LDN, CDE Inpatient Diabetes Coordinator [phone]

## 2016-08-21 NOTE — Progress Notes (Signed)
qPhysical Therapy Treatment Patient Details Name: [name] MRN: [id number] DOB: July 14, 1950 Today's Date: 08/21/2016    History of Present Illness This 66 y.o. admitted with AMS.   In ED she was found to have BG > 1000 and was severely volume depleted.  Dx:  nonketotic hyperosmolar hyperglycemia, secondary AKI and encephalopathy.  PMH:  H/o CVA with no residual deficits, PVD, HTN, Hepatitis C, GERD, DM, anxiety, s/p s/p AKA    PT Comments    The patient is progressing well. Plans Dc today. Transfers well without assistance.   Follow Up Recommendations  Home health PT     Equipment Recommendations  None recommended by PT    Recommendations for Other Services       Precautions / Restrictions Precautions Precautions: Fall    Mobility  Bed Mobility Overal bed mobility: Independent                Transfers Overall transfer level: Needs assistance   Transfers: Stand Pivot Transfers;Squat Pivot Transfers Sit to Stand: Supervision Stand pivot transfers: Supervision Squat pivot transfers: Supervision     General transfer comment: set up [facility] for stand and pivot from bed then to toilet and back to WC. Stands  for peri hygiene, holds to rail.   Ambulation/Gait                 Stairs            Wheelchair Mobility    Modified Rankin (Stroke Patients Only)       Balance           Standing balance support: No upper extremity supported Standing balance-Leahy Scale: Good                              Cognition Arousal/Alertness: Awake/alert                                            Exercises      General Comments        Pertinent Vitals/Pain Faces Pain Scale: No hurt Pain Intervention(s): Monitored during session    Home Living                      Prior Function            PT Goals (current goals can now be found in the care plan section) Progress towards PT goals: Progressing toward  goals    Frequency    Min 3X/week      PT Plan Current plan remains appropriate    Co-evaluation             End of Session   Activity Tolerance: Patient tolerated treatment well Patient left: in chair Nurse Communication: Mobility status       Time: 7824-2353 PT Time Calculation (min) (ACUTE ONLY): 30 min  Charges:  $Therapeutic Activity: 8-22 mins $Self Care/Home Management: 8-22                    G CodesTresa Endo PT [phone]    Claretha Cooper 08/21/2016, 1:03 PM

## 2016-08-21 NOTE — Care Management Note (Signed)
Case Management Note  Patient Details  Name: [name] MRN: [id number] Date of Birth: [birth year]  Subjective/Objective: Faxed w/confirmation HHC orders,& [id number] to [facility] attn [facility] [phone].                   Action/Plan:d/c home w/HHC.   Expected Discharge Date:  08/21/16               Expected Discharge Plan:  [place]  In-House Referral:     Discharge planning Services  CM Consult  Post Acute Care Choice:    Choice offered to:  Patient  DME Arranged:    DME Agency:     HH Arranged:  PT, OT HH Agency:  [facility]  Status of Service:  Completed, signed off  If discussed at [place] of Stay Meetings, dates discussed:    Additional Comments:  Dessa Phi, RN 08/21/2016, 3:15 PM

## 2016-08-21 NOTE — Progress Notes (Signed)
Patient discharged home. Discharge instructions given and explained to patient and she verbalized understanding, denies any distress. No injury/wound noted.

## 2016-08-21 NOTE — Discharge Summary (Addendum)
Discharge Summary  [name] XBM:[id number] DOB: 12/05/1950  PCP: [name], MD  Admit date: 08/17/2016 Discharge date: 08/21/2016  Time spent: 25 minutes   Recommendations for Outpatient Follow-up:  1. New medication: Metformin 1000 by mouth twice a day 2. Patient will follow up with her PCP tomorrow as scheduled 3. New medication: Colchicine 0.6 by mouth twice a day 1 week  4. Patient will be followed by [facility] with PT/OT 5. Discontinued medication: HCTZ secondary to gout precipitation  Discharge Diagnoses:  Active Hospital Problems   Diagnosis Date Noted  . AKI (acute kidney injury) ([place]) 08/17/2016  . Hypotension 08/17/2016  . Hyperosmolar non-ketotic state in patient with type 2 diabetes mellitus ([place]) 08/17/2016  . Acute encephalopathy 08/17/2016  . Iron deficiency anemia due to chronic blood loss 02/23/2015  . Uncontrolled type 2 diabetes mellitus with chronic kidney disease, without long-term current use of insulin ([place]) 06/07/2013  . Essential hypertension, benign 07/22/2008    Resolved Hospital Problems   Diagnosis Date Noted Date Resolved  No resolved problems to display.    Discharge Condition: Improved, being discharged home with home health   Diet recommendation: Carb modified low sodium   Vitals:   08/21/16 0517 08/21/16 1037  BP: (!) 141/79 114/78  Pulse: 78 92  Resp: 20   Temp: 98.7 F (37.1 C)     History of present illness:  66 year old female with past medical history of essential hypertension, iron deficiency anemia from chronic blood loss from AVMs, hepatic cirrhosis and diet-controlled diabetes who overall has had quite good control of her diabetes (A1c in October was 5.5) and in fact, patient's PCP stopped her metformin around that time. Patient states she's overall been in good health but then in the last month, she's had episodes with nausea and vomiting so for the last month she has only drank ensure and eaten no  food.  She denies any issues with coughing, urinary frequency or fevers. Patient was at home by herself, but with family nearby. Family brought patient on afternoon of 3/23 for confusion-patient was mumbling, delirious.  In the emergency room, patient found to have sugars greater than 1000 and she was severely volume depleted. Systolic blood pressure in the 60s. Patient received multiple multiple fluid boluses as well as started on an insulin drip. She was not in DKA. White count was only minimally elevated at 12.3 and pro-calcitonin level normal. Lactic acid level elevated at 2.2, although this was felt to be more related to severe volume depletion. Hospitalists were called for further evaluation and admission.   Hospital Course:  Active Problems:   Essential hypertension, benign: Stable. Once hypovolemia treated, antihypertensives restarted except for HCTZ which was discontinued because of gout precipitation.    Uncontrolled (previously well-controlled ) type 2 diabetes mellitus with chronic kidney disease Stg 2, without long-term current use of insulin (HCC) who presented with nonketotic hyperosmolar hyperglycemia and secondary acute kidney injury: Suspect the patient did not have infection actually cause herself to go into the state by drinking ensure only for the past month. A1c greater than 15.5. Following removal of insulin drip, patient on sliding scale only however she still had sugars that trended upwards into the 200s-300s. She was started on Lantus which we titrated up to 13 units daily at bedtime by day of discharge. I 7 discussed this with the patient about what she should be on long-term. Her A1c I feel like he is an accurate as a suspect her sugars  were so high in the past month that they said Q her true insulin needs and she likely was much more well-controlled at 2 months before that. She has a follow-up appointment tomorrow with her PCP and the patient prefers to be on pills she was told  by her PCP to start insulin. Will do compromise and she'll be discharged in a prescription of 1000 metformin twice a day and since she has a follow-up appointment tomorrow, she should get to hyperglycemic by then. Acute kidney injury resolved with fluid resuscitation    Iron deficiency anemia due to chronic blood loss: Hemoglobin on admission 12.6 however this was likely hemoconcentrated and with IV fluids, stayed around 9.3.    Acute encephalopathy: Secondary to nonketotic hyperglycemia. Resolved once sugars stabilized.  Obesity: Patient meets criteria with BMI greater than 30.  Acute gouty arthropathy: On hospital day 3,patient complaining of right leg 10/10 continuous pain which she describes as mostly in the anterior aspect of her right knee but also some down into her calf and going down into her heel. Chest complains of numbness in her right foot. Knee x-ray and lower extremity Doppler normal. Elevated serum uric acid level 8.6. Patient started on colchicine which has helped improve her pain. Given prescription for colchicine for next 7 days.  Procedures:  Right lower extremity Doppler done 3/25: Normal  Consultations:  None   Discharge Exam: BP 114/78   Pulse 92   Temp 98.7 F (37.1 C) (Oral)   Resp 20   Ht 5\' 3"  (1.6 m)   Wt 81.9 kg (180 lb 8.9 oz)   SpO2 98%   BMI 31.98 kg/m   General: Alert and oriented 3, no acute distress  Cardiovascular: Regular rate and rhythm, S1-S2  Respiratory: Clear to auscultation bilaterally   Discharge Instructions You were cared for by a hospitalist during your hospital stay. If you have any questions about your discharge medications or the care you received while you were in the hospital after you are discharged, you can call the unit and asked to speak with the hospitalist on call if the hospitalist that took care of you is not available. Once you are discharged, your primary care physician will handle any further medical issues. Please  note that NO REFILLS for any discharge medications will be authorized once you are discharged, as it is imperative that you return to your primary care physician (or establish a relationship with a primary care physician if you do not have one) for your aftercare needs so that they can reassess your need for medications and monitor your lab values.  Discharge Instructions    Diet - low sodium heart healthy    Complete by:  As directed    Diet Carb Modified    Complete by:  As directed    Increase activity slowly    Complete by:  As directed      Allergies as of 08/21/2016      Reactions   Ciprofloxacin Hives, Swelling   Morphine And Related Other (See Comments)   Makes pt sad and paranoid   Penicillins Hives, Swelling   Has patient had a PCN reaction causing immediate rash, facial/tongue/throat swelling, SOB or lightheadedness with hypotension:YES Has patient had a PCN reaction causing severe rash involving mucus membranes or skin necrosis:UNSURE Has patient had a PCN reaction that required hospitalization:YES Has patient had a PCN reaction occurring within the last 10 years:No If all of the above answers are "NO", then may proceed with  Cephalosporin use.   Codeine Hives, Swelling   Lisinopril Cough   New onset dry cough after starting lisinopril. Resolved upon switching to losartan.   Feraheme [ferumoxytol] Itching   Tolerated Feraheme 6/26 & 7/21 pre-medications prior to medication      Medication List    STOP taking these medications   azithromycin 250 MG tablet Commonly known as:  ZITHROMAX Z-PAK   hydrochlorothiazide 25 MG tablet Commonly known as:  HYDRODIURIL     TAKE these medications   aspirin EC 81 MG tablet Take 1 tablet (81 mg total) by mouth daily.   atenolol 50 MG tablet Commonly known as:  TENORMIN TAKE 1 TABLET BY MOUTH EVERY MORNING What changed:  See the new instructions.   cetirizine 10 MG tablet Commonly known as:  ZYRTEC TAKE 1 TABLET BY MOUTH  DAILY What changed:  See the new instructions.   cholecalciferol 1000 units tablet Commonly known as:  VITAMIN D TAKE ONE TABLET BY MOUTH DAILY. What changed:  See the new instructions.   colchicine 0.6 MG tablet Take 1 tablet (0.6 mg total) by mouth 2 (two) times daily.   feeding supplement (GLUCERNA SHAKE) Liqd Take 237 mLs by mouth 3 (three) times daily between meals.   ferrous sulfate 325 (65 FE) MG EC tablet Take 1 tablet (325 mg total) by mouth daily after breakfast.   FLUoxetine 40 MG capsule Commonly known as:  PROZAC TAKE 1 CAPSULE BY MOUTH DAILY What changed:  See the new instructions.   gabapentin 400 MG capsule Commonly known as:  NEURONTIN TAKE 2 CAPSULES BY MOUTH THREE TIMES A DAY What changed:  See the new instructions.   losartan 50 MG tablet Commonly known as:  COZAAR TAKE 1 TABLET BY MOUTH EVERY DAY What changed:  See the new instructions.   metFORMIN 1000 MG tablet Commonly known as:  GLUCOPHAGE Take 1 tablet (1,000 mg total) by mouth 2 (two) times daily with a meal.   omeprazole 20 MG capsule Commonly known as:  PRILOSEC TAKE 1 CAPSULE BY MOUTH EVERY DAY **STOP NEXIUM** What changed:  See the new instructions.   potassium chloride 20 MEQ/15ML (10%) Soln TAKE 15ML BY MOUTH EVERY DAY   traMADol 50 MG tablet Commonly known as:  ULTRAM Take 50 mg by mouth every 6 (six) hours as needed (for pain.).   traZODone 50 MG tablet Commonly known as:  DESYREL TAKE 1 TABLET BY MOUTH EVERY NIGHT AT BEDTIME What changed:  See the new instructions.   vitamin C 500 MG tablet Commonly known as:  ASCORBIC ACID Take 500 mg by mouth every morning.       Allergies  Allergen Reactions  . Ciprofloxacin Hives and Swelling  . Morphine And Related Other (See Comments)    Makes pt sad and paranoid  . Penicillins Hives and Swelling    Has patient had a PCN reaction causing immediate rash, facial/tongue/throat swelling, SOB or lightheadedness with  hypotension:YES Has patient had a PCN reaction causing severe rash involving mucus membranes or skin necrosis:UNSURE Has patient had a PCN reaction that required hospitalization:YES Has patient had a PCN reaction occurring within the last 10 years:No If all of the above answers are "NO", then may proceed with Cephalosporin use.   . Codeine Hives and Swelling  . Lisinopril Cough    New onset dry cough after starting lisinopril. Resolved upon switching to losartan.  Shirlean Kelly [Ferumoxytol] Itching    Tolerated Feraheme 6/26 & 7/21 pre-medications prior to medication  Follow-up Information    [facility] Follow up.   Specialty:  [place] Why:  [place] physical therapy/occupational therapy. Contact information: [address] [place] [address] [phone]            The results of significant diagnostics from this hospitalization (including imaging, microbiology, ancillary and laboratory) are listed below for reference.    Significant Diagnostic Studies: Dg Chest 2 View  Result Date: 08/09/2016 CLINICAL DATA:  Productive cough for 1 day EXAM: CHEST  2 VIEW COMPARISON:  09/23/2015 FINDINGS: The heart size and mediastinal contours are within normal limits. Both lungs are clear. The visualized skeletal structures are unremarkable. IMPRESSION: No active cardiopulmonary disease. Electronically Signed   By: Inez Catalina M.D.   On: 08/09/2016 14:02   Dg Chest Portable 1 View  Result Date: 08/17/2016 CLINICAL DATA:  Progressive confusion over the last week. History of diabetes and hypertension. EXAM: PORTABLE CHEST 1 VIEW COMPARISON:  08/09/2016 and 09/15/2015. FINDINGS: 1847 hour. The heart size and mediastinal contours are stable. There are lower lung volumes with increased subsegmental atelectasis at both lung bases. Left apical lucency is stable, consistent with emphysema. No confluent airspace opacity, pleural effusion or pneumothorax seen. The bones  appear unchanged. Telemetry leads overlie the chest. IMPRESSION: Increased bibasilar atelectasis.  No other significant changes. Electronically Signed   By: Richardean Sale M.D.   On: 08/17/2016 18:56   Dg Knee Complete 4 Views Right  Result Date: 08/19/2016 CLINICAL DATA:  Pain EXAM: RIGHT KNEE - COMPLETE 4+ VIEW COMPARISON:  February 22, 2015 FINDINGS: Frontal, lateral, and bilateral oblique views were obtained. There is mild generalized soft tissue swelling. No fracture or dislocation. No joint effusion. There is moderately severe joint space narrowing medially and in the patellofemoral joint region. There is spurring in all compartments, most notably medially and in the patellofemoral joint regions. No erosive change. IMPRESSION: Osteoarthritic change, most marked medially and in the patellofemoral joint regions. No fracture or joint effusion. Electronically Signed   By: Lowella Grip III M.D.   On: 08/19/2016 10:32   Dg Abd Portable 1v  Result Date: 08/18/2016 CLINICAL DATA:  Patient with obstipation. EXAM: PORTABLE ABDOMEN - 1 VIEW COMPARISON:  Abdominal ultrasound 03/23/2016 FINDINGS: Lung bases are clear. Gas is demonstrated within nondilated loops of large and small bowel in a nonobstructed pattern. Supine evaluation limited for the detection of free intraperitoneal air. Lumbar spine degenerative changes. IMPRESSION: Nonobstructed bowel gas pattern. Electronically Signed   By: Lovey Newcomer M.D.   On: 08/18/2016 13:35    Microbiology: Recent Results (from the past 240 hour(s))  Culture, blood (Routine X 2) w Reflex to ID Panel     Status: None (Preliminary result)   Collection Time: 08/17/16 10:02 PM  Result Value Ref Range Status   Specimen Description BLOOD LEFT HAND  Final   Special Requests BOTTLES DRAWN AEROBIC AND ANAEROBIC 5ML EA  Final   Culture   Final    NO GROWTH 2 DAYS Performed at [facility], [place] [address], [place], [place] [address]    Report Status PENDING   Incomplete  Culture, blood (Routine X 2) w Reflex to ID Panel     Status: None (Preliminary result)   Collection Time: 08/17/16 10:07 PM  Result Value Ref Range Status   Specimen Description BLOOD RIGHT ANTECUBITAL  Final   Special Requests BOTTLES DRAWN AEROBIC AND ANAEROBIC 5ML EA  Final   Culture   Final  NO GROWTH 2 DAYS Performed at [facility], [place] [address] [place], [place] [address]    Report Status PENDING  Incomplete  MRSA PCR Screening     Status: None   Collection Time: 08/17/16 10:52 PM  Result Value Ref Range Status   MRSA by PCR NEGATIVE NEGATIVE Final    Comment:        The GeneXpert MRSA Assay (FDA approved for NASAL specimens only), is one component of a comprehensive MRSA colonization surveillance program. It is not intended to diagnose MRSA infection nor to guide or monitor treatment for MRSA infections.      Labs: Basic Metabolic Panel:  Recent Labs Lab 08/18/16 0137 08/18/16 0330 08/18/16 0954 08/19/16 0521 08/20/16 0507  NA 131* 133* 135 134* 134*  K 3.7 3.5 3.7 4.2 3.9  CL 97* 102 104 106 106  CO2 25 26 25 22  21*  GLUCOSE 299* 221* 126* 349* 265*  BUN 52* 52* 45* 30* 14  CREATININE 2.45* 2.04* 1.63* 1.29* 0.96  CALCIUM 8.8* 8.0* 8.1* 8.3* 8.2*  MG  --  2.0  --   --   --   PHOS  --  3.7  --   --   --    Liver Function Tests:  Recent Labs Lab 08/17/16 1845 08/18/16 0330  AST 17 14*  ALT 15 12*  ALKPHOS 97 66  BILITOT 0.7 0.4  PROT 8.7* 6.3*  ALBUMIN 4.3 3.2*   No results for input(s): LIPASE, AMYLASE in the last 168 hours.  Recent Labs Lab 08/17/16 2207  AMMONIA 26   CBC:  Recent Labs Lab 08/17/16 1845 08/17/16 1850 08/18/16 0330 08/20/16 0507  WBC 12.3*  --  10.7* 7.3  NEUTROABS 9.8*  --   --   --   HGB 11.1* 12.6 9.0* 9.3*  HCT 32.6* 37.0 25.0* 27.3*  MCV 85.8  --  82.8 85.6  PLT 392  --  274 293   Cardiac Enzymes:  Recent Labs Lab 08/17/16 1845 08/17/16 2206 08/18/16 0330 08/18/16 0954   TROPONINI <0.03 <0.03 <0.03 <0.03   BNP: BNP (last 3 results) No results for input(s): BNP in the last 8760 hours.  ProBNP (last 3 results) No results for input(s): PROBNP in the last 8760 hours.  CBG:  Recent Labs Lab 08/20/16 1312 08/20/16 1636 08/20/16 2204 08/21/16 0754 08/21/16 1138  GLUCAP 279* 194* 243* 283* 314*       Signed:  Annita Brod, MD [facility] 08/21/2016, 2:36 PM

## 2016-08-22 ENCOUNTER — Encounter: Payer: Self-pay | Admitting: Gastroenterology

## 2016-08-22 ENCOUNTER — Ambulatory Visit (INDEPENDENT_AMBULATORY_CARE_PROVIDER_SITE_OTHER): Payer: Medicare Other | Admitting: Gastroenterology

## 2016-08-22 VITALS — BP 132/80 | HR 76 | Ht 63.0 in | Wt 170.0 lb

## 2016-08-22 DIAGNOSIS — D500 Iron deficiency anemia secondary to blood loss (chronic): Secondary | ICD-10-CM | POA: Diagnosis not present

## 2016-08-22 DIAGNOSIS — K921 Melena: Secondary | ICD-10-CM | POA: Diagnosis not present

## 2016-08-22 NOTE — Patient Instructions (Signed)
If you are age 65 or older, your body mass index should be between 23-30. Your Body mass index is 30.11 kg/m. If this is out of the aforementioned range listed, please consider follow up with your Primary Care Provider.  If you are age 64 or younger, your body mass index should be between 19-25. Your Body mass index is 30.11 kg/m. If this is out of the aformentioned range listed, please consider follow up with your Primary Care Provider.   Thank you for choosing [facility]  Dr Henry Danis III  

## 2016-08-22 NOTE — Progress Notes (Signed)
[facility] follow-up Note:  History: [name] 08/22/2016  Referring physician: [name], MD  Reason for consult/chief complaint: Anemia (Iron Def., rescheduled from snow day, Patient is not bleeding presently)   Subjective  HPI:  In 10/16, Dr. Deatra Ina wrote: 66 year old Afro-American female discharged several days ago on admission for GI bleeding.   Marland Kitchen  Hx HTN, NIDDM.  S/P left AKA.  Hx HCV on Harvoni through the end of October 2016 under the supervision of Dr. Linus Salmons.  Liver biopsy in 2007 confirmed grade 1, stage I chronic hepatitis Not known to have cirrhosis. On abdominal ultrasound with the last progress. Of 12/30/2014 liver parenchyma was normal. Metavir fibrosis score with some F3 and F4 with high risk of fibrosis. S/P open incisional hernia repair with mesh in 07/2014, she required subsequent wound VAC and the incision has been slow to heal. For this she is follows with Dr. Ninfa Linden. Chronic iron deficiency anemia is treated with Feraheme transfusions at the cancer center every 3-4 months.  Last infusion was on 12/23/14 and she has an upcoming appointment at the cancer center for 03/02/2015. She was last transfused with blood, 2 PRBCs, in January 2 016.  She was also transfused in 03/2014.   Long-standing history of anemia and investigations of GI bleeding. Innumerable EGDs. Previous colonoscopy, flexible sigmoidoscopy and at least a couple of capsule endoscopies. EGD in 8/201 to perform because of active bleeding on capsule Endo. Dr. Benson Norway saw to AVMs 61 D2 was bleeding, the other at D3 was not bleeding. Both of these were treated with endoclips. On subsequent EGDs in 2012 and 2013 studies were either normal or small hiatal hernia noted.  Enteroscopies x 2  in 11/2012 revealed duodenal AVMs all of which were ablated, none of which were actively bleeding. A flexible sigmoidoscopy in July 2013 due to painless hematochezia noted blood and diverticulosis in the  left colon.  a sigmoid polyp was removed at colonoscopy in 2011.   In 01/2012 she underwent partial left colectomy because of recurrent lower GI bleeding.   --------------------------------------------------------------------------- Last sigmoidoscopy by Deatra Ina in 7/13 revealed blood and left sided diverticulosis. Underwent HCV treatment and cleared.  Neg HCV viral load in 10/16 and 4/17  She was sent by hematology for reevaluation after the retirement of Dr. Deatra Ina in late 2016. The patient has received IV iron periodically, although it is not entirely clear when she last received. She seems to think it was sometime earlier this year. She was just discharged yesterday from several days hospital stay due to [facility] with glucose over 1000. She reports that 2 or 3 weeks ago she had an episode of some flecks of blood in her mouth that she thinks may have been from ill fitting dentures. I had a day of passing black tarry stool which resolved on its own. Her hemoglobin upon admission last week was 12, dropped to 9.0 after large-volume IV fluid administration. She reports that her last episode of melena prior to this one that occurred a few weeks ago was perhaps 18-24 months before. She has ongoing abdominal pain from a incompletely healed midline incision and persistent abdominal wall hernia followed by Dr. Ninfa Linden.  ROS:  Review of Systems  She feels fatigued and dyspneic after this recent hospital stay. She denies chest pain and says she may have had a previous stroke and does not believe she's had a coronary stent.  Past Medical History: Past Medical History:  Diagnosis Date  . Allergic rhinitis   .  Anemia, iron deficiency 05/03/2011   recieves iron infusions as well as periodic red blood cell transfusions  . Anxiety   . Colon polyps   . Diabetes mellitus without complication ([place])   . Difficulty sleeping    takes trazadone for sleep  . Diverticulosis   . GERD (gastroesophageal reflux  disease)   . GI bleeding    Has had bleeding from small bowel AVMs as well as from colon diverticulosis.  . Hepatitis C    Began Harvoni early 82016. completion date late 02/2015.   Marland Kitchen Hypertension   . Hypokalemic alkalosis   . 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 Peripheral vascular disease ([place])   . Stroke [facility]) 2010   no residual problems     Past Surgical History: Past Surgical History:  Procedure Laterality Date  . ABDOMINAL HYSTERECTOMY    . APPLICATION OF WOUND VAC N/A 06/21/2014   Procedure: APPLICATION OF WOUND VAC;  Surgeon: Coralie Keens, MD;  Location: [place];  Service: General;  Laterality: N/A;  . CARPAL TUNNEL RELEASE     rt hand  . ENTEROSCOPY N/A 12/04/2012   Procedure: ENTEROSCOPY;  Surgeon: Beryle Beams, MD;  Location: [facility];  Service: Endoscopy;  Laterality: N/A;  . ENTEROSCOPY N/A 12/18/2012   Procedure: ENTEROSCOPY;  Surgeon: Beryle Beams, MD;  Location: [facility];  Service: Endoscopy;  Laterality: N/A;  . ENTEROSCOPY N/A 02/25/2015   Procedure: ENTEROSCOPY;  Surgeon: Inda Castle, MD;  Location: [facility] END[facility]COPY;  Service: Endoscopy;  Laterality: N/A;  . ESOPHAGOGASTRODUODENOSCOPY  05/05/2011   Procedure: ESOPHAGOGASTRODUODENOSCOPY (EGD);  Surgeon: Zenovia Jarred, MD;  Location: [facility];  Service: Gastroenterology;  Laterality: N/A;  Dr. Hilarie Fredrickson will do procedure for Dr. Benson Norway Saturday.  . ESOPHAGOGASTRODUODENOSCOPY  05/08/2011   Procedure: ESOPHAGOGASTRODUODENOSCOPY (EGD);  Surgeon: Beryle Beams;  [facility];  Service: Endoscopy;  Laterality: N/A;  . ESOPHAGOGASTRODUODENOSCOPY  06/07/2011   Procedure: ESOPHAGOGASTRODUODENOSCOPY (EGD);  Surgeon: Beryle Beams, MD;  Location: [facility];  Service: Endoscopy;  Laterality: N/A;  . ESOPHAGOGASTRODUODENOSCOPY  12/20/2011   Procedure: ESOPHAGOGASTRODUODENOSCOPY (EGD);  Surgeon: Beryle Beams, MD;  Location: [facility];  Service: Endoscopy;  Laterality: N/A;  . EYE SURGERY Bilateral     cataracts  . FLEXIBLE SIGMOIDOSCOPY  12/21/2011   Procedure: FLEXIBLE SIGMOIDOSCOPY;  Surgeon: Beryle Beams, MD;  [facility];  Service: Endoscopy;  Laterality: N/A;  . HOT HEMOSTASIS  06/07/2011   Procedure: HOT HEMOSTASIS (ARGON PLASMA COAGULATION/BICAP);  Surgeon: Beryle Beams, MD;  Location: [facility];  Service: Endoscopy;  Laterality: N/A;  . INCISIONAL HERNIA REPAIR N/A 06/01/2014   Procedure: ATTEMPTED LAPAROSCOPIC AND OPEN INCISIONAL HERNIA REPAIR WITH MESH;  Surgeon: Coralie Keens, MD;  Location: [place];  Service: General;  Laterality: N/A;  . INSERTION OF MESH N/A 06/01/2014   Procedure: INSERTION OF MESH;  Surgeon: Coralie Keens, MD;  Location: [place];  Service: General;  Laterality: N/A;  . LAPAROTOMY  02/16/2012   Procedure: EXPLORATORY LAPAROTOMY;  Surgeon: Edward Jolly, MD;  Location: [facility];  Service: General;  Laterality: N/A;  oversewing of anastomotic leak and rigid sigmoidoscopy  . LAPAROTOMY N/A 06/21/2014   Procedure: ABDOMINAL WOUND EXPLORATION;  Surgeon: Coralie Keens, MD;  Location: [place];  Service: General;  Laterality: N/A;  . LEG AMPUTATION ABOVE KNEE    . PARTIAL COLECTOMY  02/15/2012   Procedure: PARTIAL COLECTOMY;  Surgeon: Harl Bowie, MD;  Location: [facility];  Service: General;  Laterality: N/A;  .  TONSILLECTOMY       Family History: Family History  Problem Relation Age of Onset  . Cancer Father     unknown  . Hypertension Mother   . Diverticulitis Mother     Social History: Social History   Social History  . Marital status: Widowed    Spouse name: N/A  . Number of children: 3  . Years of education: N/A   Occupational History  . Retired    Social History Main Topics  . Smoking status: Current Every Day Smoker    Packs/day: 0.20    Years: 17.00    Types: Cigarettes  . Smokeless tobacco: Never Used     Comment: wants to quit.  Smoking less 1 pk per week.  1-2 cigs per day  . Alcohol use 0.0 oz/week      Comment: Occasionally  . Drug use: Yes    Types: Marijuana     Comment: occasional  . Sexual activity: Yes    Birth control/ protection: Post-menopausal   Other Topics Concern  . None   Social History Narrative  . None    Allergies: Allergies  Allergen Reactions  . Ciprofloxacin Hives and Swelling  . Morphine And Related Other (See Comments)    Makes pt sad and paranoid  . Penicillins Hives and Swelling    Has patient had a PCN reaction causing immediate rash, facial/tongue/throat swelling, SOB or lightheadedness with hypotension:YES Has patient had a PCN reaction causing severe rash involving mucus membranes or skin necrosis:UNSURE Has patient had a PCN reaction that required hospitalization:YES Has patient had a PCN reaction occurring within the last 10 years:No If all of the above answers are "NO", then may proceed with Cephalosporin use.   . Codeine Hives and Swelling  . Lisinopril Cough    New onset dry cough after starting lisinopril. Resolved upon switching to losartan.  Shirlean Kelly [Ferumoxytol] Itching    Tolerated Feraheme 6/26 & 7/21 pre-medications prior to medication    Outpatient Meds: Current Outpatient Prescriptions  Medication Sig Dispense Refill  . aspirin EC 81 MG tablet Take 1 tablet (81 mg total) by mouth daily. 30 tablet 1  . atenolol (TENORMIN) 50 MG tablet TAKE 1 TABLET BY MOUTH EVERY MORNING (Patient taking differently: TAKE 50 MG BY MOUTH EVERY MORNING) 90 tablet 1  . cetirizine (ZYRTEC) 10 MG tablet TAKE 1 TABLET BY MOUTH DAILY (Patient taking differently: TAKE 10 MG BY MOUTH DAILY) 90 tablet 2  . cholecalciferol (VITAMIN D) 1000 units tablet TAKE ONE TABLET BY MOUTH DAILY. (Patient taking differently: TAKE 1000 UNITS BY MOUTH DAILY.) 100 tablet 2  . colchicine 0.6 MG tablet Take 1 tablet (0.6 mg total) by mouth 2 (two) times daily. 14 tablet 2  . feeding supplement, GLUCERNA SHAKE, (GLUCERNA SHAKE) LIQD Take 237 mLs by mouth 3 (three) times daily  between meals. 90 Can 0  . ferrous sulfate 325 (65 FE) MG EC tablet Take 1 tablet (325 mg total) by mouth daily after breakfast. 30 tablet 1  . FLUoxetine (PROZAC) 40 MG capsule TAKE 1 CAPSULE BY MOUTH DAILY (Patient taking differently: TAKE 40 MG BY MOUTH DAILY) 30 capsule 5  . gabapentin (NEURONTIN) 400 MG capsule TAKE 2 CAPSULES BY MOUTH THREE TIMES A DAY (Patient taking differently: TAKE 800 MG BY MOUTH THREE TIMES A DAY) 180 capsule 5  . losartan (COZAAR) 50 MG tablet TAKE 1 TABLET BY MOUTH EVERY DAY (Patient taking differently: TAKE 50 MG  BY MOUTH EVERY DAY) 30 tablet  5  . metFORMIN (GLUCOPHAGE) 1000 MG tablet Take 1 tablet (1,000 mg total) by mouth 2 (two) times daily with a meal. 60 tablet 2  . omeprazole (PRILOSEC) 20 MG capsule TAKE 1 CAPSULE BY MOUTH EVERY DAY **STOP NEXIUM** (Patient taking differently: TAKE 20 MG BY MOUTH EVERY DAY **STOP NEXIUM**) 30 capsule 5  . potassium chloride 20 MEQ/15ML (10%) SOLN TAKE 15ML BY MOUTH EVERY DAY 450 mL 0  . traMADol (ULTRAM) 50 MG tablet Take 50 mg by mouth every 6 (six) hours as needed (for pain.).   0  . traZODone (DESYREL) 50 MG tablet TAKE 1 TABLET BY MOUTH EVERY NIGHT AT BEDTIME (Patient taking differently: TAKE 50 MG BY MOUTH EVERY NIGHT AT BEDTIME) 30 tablet 3  . vitamin C (ASCORBIC ACID) 500 MG tablet Take 500 mg by mouth every morning.     No current facility-administered medications for this visit.       ___________________________________________________________________ Objective   Exam:  BP 132/80   Pulse 76   Ht 5\' 3"  (1.6 m)   Wt 170 lb (77.1 kg) Comment: Patient weighs 202 and requested subtraction of 32lbs for pr  BMI 30.11 kg/m    General: this is a(n) Chronically ill-appearing woman in a wheelchair, alert and conversational when appropriate   Eyes: sclera anicteric, no redness  ENT: oral mucosa moist without lesions, no cervical or supraclavicular lymphadenopathy, good dentition  CV: RRR without murmur, S1/S2,  no JVD, no peripheral edema  Resp: clear to auscultation bilaterally, normal RR and effort noted  GI: soft, + left-sided abdominal wall tenderness due to hernia., with active bowel sounds. No guarding or palpable organomegaly noted. Incompletely healed midline abdominal scar, proximally 4mm in diameter, no friable or bleeding tissue, no fistula or drainage  Skin; warm and dry, no rash or jaundice noted  Neuro: awake, alert and oriented x 3. Limited by wheelchair status. Normal upper extremity motor function decreased major muscle groups motor function left leg due to prosthesis has a prior BKA  Labs:  CBC Latest Ref Rng & Units 08/20/2016 08/18/2016 08/17/2016  WBC 4.0 - 10.5 K/uL 7.3 10.7(H) -  Hemoglobin 12.0 - 15.0 g/dL 9.3(L) 9.0(L) 12.6  Hematocrit 36.0 - 46.0 % 27.3(L) 25.0(L) 37.0  Platelets 150 - 400 K/uL 293 274 -   CMP Latest Ref Rng & Units 08/20/2016 08/19/2016 08/18/2016  Glucose 65 - 99 mg/dL 265(H) 349(H) 126(H)  BUN 6 - 20 mg/dL 14 30(H) 45(H)  Creatinine 0.44 - 1.00 mg/dL 0.96 1.29(H) 1.63(H)  Sodium 135 - 145 mmol/L 134(L) 134(L) 135  Potassium 3.5 - 5.1 mmol/L 3.9 4.2 3.7  Chloride 101 - 111 mmol/L 106 106 104  CO2 22 - 32 mmol/L 21(L) 22 25  Calcium 8.9 - 10.3 mg/dL 8.2(L) 8.3(L) 8.1(L)  Total Protein 6.5 - 8.1 g/dL - - -  Total Bilirubin 0.3 - 1.2 mg/dL - - -  Alkaline Phos 38 - 126 U/L - - -  AST 15 - 41 U/L - - -  ALT 14 - 54 U/L - - -    Assessment: Encounter Diagnoses  Name Primary?  . Iron deficiency anemia due to chronic blood loss Yes  . Melena    Long-standing iron deficiency anemia from recurrent small bowel AVMs. She had an episode of self-limited melena 2 or 3 weeks ago. It is not clear if her drop in hemoglobin during hospital stay was due to IV fluids or food revealed her baseline low hemoglobin.   Plan:  She  has a lab appointment with hematology the week after next to get her CBC and hopefully iron studies checked as well. I think that is  fine, and she can be given IV iron by the hematology service as needed. She was advised that if melena recurs again in last longer than 24 hours or is associated with chest pain, lightheadedness or worsening dyspnea, she should present immediately to the ED. I've no endoscopic procedures planned at present  Continue PPI because she is on aspirin and has a history of GI bleeding  See me as needed  Total time 30 minutes, over half spent in extensive record review and discussion with patient.  Nelida Meuse III  CC: [name], MD

## 2016-08-23 LAB — CULTURE, BLOOD (ROUTINE X 2)
CULTURE: NO GROWTH
Culture: NO GROWTH

## 2016-08-27 ENCOUNTER — Other Ambulatory Visit: Payer: Medicare Other

## 2016-08-27 DIAGNOSIS — S78119A Complete traumatic amputation at level between unspecified hip and knee, initial encounter: Secondary | ICD-10-CM | POA: Diagnosis not present

## 2016-08-27 DIAGNOSIS — S31102A Unspecified open wound of abdominal wall, epigastric region without penetration into peritoneal cavity, initial encounter: Secondary | ICD-10-CM | POA: Diagnosis not present

## 2016-08-27 DIAGNOSIS — T814XXA Infection following a procedure, initial encounter: Secondary | ICD-10-CM | POA: Diagnosis not present

## 2016-08-28 ENCOUNTER — Other Ambulatory Visit (HOSPITAL_BASED_OUTPATIENT_CLINIC_OR_DEPARTMENT_OTHER): Payer: Medicare Other

## 2016-08-28 DIAGNOSIS — D500 Iron deficiency anemia secondary to blood loss (chronic): Secondary | ICD-10-CM | POA: Diagnosis not present

## 2016-08-28 LAB — CBC WITH DIFFERENTIAL/PLATELET
BASO%: 0.7 % (ref 0.0–2.0)
Basophils Absolute: 0 10*3/uL (ref 0.0–0.1)
EOS%: 2.3 % (ref 0.0–7.0)
Eosinophils Absolute: 0.1 10*3/uL (ref 0.0–0.5)
HEMATOCRIT: 30.8 % — AB (ref 34.8–46.6)
HEMOGLOBIN: 9.9 g/dL — AB (ref 11.6–15.9)
LYMPH#: 1.6 10*3/uL (ref 0.9–3.3)
LYMPH%: 34.6 % (ref 14.0–49.7)
MCH: 28 pg (ref 25.1–34.0)
MCHC: 32.3 g/dL (ref 31.5–36.0)
MCV: 86.8 fL (ref 79.5–101.0)
MONO#: 0.7 10*3/uL (ref 0.1–0.9)
MONO%: 14.6 % — ABNORMAL HIGH (ref 0.0–14.0)
NEUT%: 47.8 % (ref 38.4–76.8)
NEUTROS ABS: 2.3 10*3/uL (ref 1.5–6.5)
Platelets: 292 10*3/uL (ref 145–400)
RBC: 3.54 10*6/uL — ABNORMAL LOW (ref 3.70–5.45)
RDW: 14.2 % (ref 11.2–14.5)
WBC: 4.7 10*3/uL (ref 3.9–10.3)

## 2016-08-28 LAB — FERRITIN: FERRITIN: 11 ng/mL (ref 9–269)

## 2016-08-30 ENCOUNTER — Telehealth: Payer: Self-pay | Admitting: *Deleted

## 2016-08-30 NOTE — Telephone Encounter (Signed)
-----   Message from Truitt Merle, MD sent at 08/29/2016  8:15 AM EDT ----- Please call pt and let her know the lab result. Her ferritin is low, please set up iv feraheme X1 within a few weeks, add lab before her f/u with me in May. Thanks  Truitt Merle  08/29/2016

## 2016-08-30 NOTE — Telephone Encounter (Signed)
Spoke with pt and informed her of low ferritin.  Informed pt that a scheduler will contact pt with appt for IV Feraheme within a few weeks as per Dr. Ernestina Penna instructions.  Pt will also need lab prior to office visit in May.  Pt voiced understanding.

## 2016-08-31 DIAGNOSIS — N289 Disorder of kidney and ureter, unspecified: Secondary | ICD-10-CM | POA: Diagnosis not present

## 2016-08-31 DIAGNOSIS — I10 Essential (primary) hypertension: Secondary | ICD-10-CM | POA: Diagnosis not present

## 2016-08-31 DIAGNOSIS — M109 Gout, unspecified: Secondary | ICD-10-CM | POA: Diagnosis not present

## 2016-08-31 DIAGNOSIS — M6281 Muscle weakness (generalized): Secondary | ICD-10-CM | POA: Diagnosis not present

## 2016-08-31 DIAGNOSIS — R2681 Unsteadiness on feet: Secondary | ICD-10-CM | POA: Diagnosis not present

## 2016-08-31 DIAGNOSIS — R739 Hyperglycemia, unspecified: Secondary | ICD-10-CM | POA: Diagnosis not present

## 2016-08-31 DIAGNOSIS — E1101 Type 2 diabetes mellitus with hyperosmolarity with coma: Secondary | ICD-10-CM | POA: Diagnosis not present

## 2016-09-01 ENCOUNTER — Telehealth: Payer: Self-pay | Admitting: Hematology

## 2016-09-01 NOTE — Telephone Encounter (Signed)
Scheduled appts per sch message from Rn Thu 4/4 sch msg.

## 2016-09-03 DIAGNOSIS — M109 Gout, unspecified: Secondary | ICD-10-CM | POA: Diagnosis not present

## 2016-09-03 DIAGNOSIS — E1101 Type 2 diabetes mellitus with hyperosmolarity with coma: Secondary | ICD-10-CM | POA: Diagnosis not present

## 2016-09-03 DIAGNOSIS — N289 Disorder of kidney and ureter, unspecified: Secondary | ICD-10-CM | POA: Diagnosis not present

## 2016-09-03 DIAGNOSIS — M6281 Muscle weakness (generalized): Secondary | ICD-10-CM | POA: Diagnosis not present

## 2016-09-03 DIAGNOSIS — R739 Hyperglycemia, unspecified: Secondary | ICD-10-CM | POA: Diagnosis not present

## 2016-09-03 DIAGNOSIS — I10 Essential (primary) hypertension: Secondary | ICD-10-CM | POA: Diagnosis not present

## 2016-09-03 DIAGNOSIS — R2681 Unsteadiness on feet: Secondary | ICD-10-CM | POA: Diagnosis not present

## 2016-09-04 ENCOUNTER — Telehealth: Payer: Self-pay | Admitting: *Deleted

## 2016-09-04 ENCOUNTER — Telehealth: Payer: Self-pay | Admitting: Hematology

## 2016-09-04 ENCOUNTER — Encounter: Payer: Self-pay | Admitting: Internal Medicine

## 2016-09-04 ENCOUNTER — Telehealth: Payer: Self-pay

## 2016-09-04 ENCOUNTER — Ambulatory Visit (INDEPENDENT_AMBULATORY_CARE_PROVIDER_SITE_OTHER): Payer: Medicare Other | Admitting: Internal Medicine

## 2016-09-04 VITALS — BP 126/71 | HR 86 | Temp 98.2°F | Ht 63.0 in | Wt 161.4 lb

## 2016-09-04 DIAGNOSIS — E1122 Type 2 diabetes mellitus with diabetic chronic kidney disease: Secondary | ICD-10-CM | POA: Diagnosis not present

## 2016-09-04 DIAGNOSIS — E1165 Type 2 diabetes mellitus with hyperglycemia: Secondary | ICD-10-CM

## 2016-09-04 DIAGNOSIS — M109 Gout, unspecified: Secondary | ICD-10-CM

## 2016-09-04 DIAGNOSIS — Z23 Encounter for immunization: Secondary | ICD-10-CM | POA: Diagnosis not present

## 2016-09-04 DIAGNOSIS — M21611 Bunion of right foot: Secondary | ICD-10-CM

## 2016-09-04 DIAGNOSIS — E1140 Type 2 diabetes mellitus with diabetic neuropathy, unspecified: Secondary | ICD-10-CM

## 2016-09-04 DIAGNOSIS — N189 Chronic kidney disease, unspecified: Secondary | ICD-10-CM

## 2016-09-04 DIAGNOSIS — IMO0002 Reserved for concepts with insufficient information to code with codable children: Secondary | ICD-10-CM

## 2016-09-04 DIAGNOSIS — N182 Chronic kidney disease, stage 2 (mild): Secondary | ICD-10-CM

## 2016-09-04 DIAGNOSIS — Z89512 Acquired absence of left leg below knee: Secondary | ICD-10-CM

## 2016-09-04 DIAGNOSIS — Z7984 Long term (current) use of oral hypoglycemic drugs: Secondary | ICD-10-CM

## 2016-09-04 DIAGNOSIS — M1A0710 Idiopathic chronic gout, right ankle and foot, without tophus (tophi): Secondary | ICD-10-CM

## 2016-09-04 MED ORDER — ALLOPURINOL 100 MG PO TABS: 100.0000 mg | ORAL_TABLET | Freq: Every day | ORAL | 2 refills | Status: DC

## 2016-09-04 MED ORDER — INSULIN PEN NEEDLE 31G X 5 MM MISC: 1.0000 | Freq: Every day | 0 refills | Status: DC

## 2016-09-04 MED ORDER — INSULIN GLARGINE 100 UNITS/ML SOLOSTAR PEN: 20.0000 [IU] | PEN_INJECTOR | Freq: Every day | SUBCUTANEOUS | 0 refills | Status: DC

## 2016-09-04 MED FILL — ALLOPURINOL 100 MG TABLET: 100 | 30 days supply | Qty: 30 | Fill #0

## 2016-09-04 MED FILL — LANTUS SOLOSTAR 100 UNITS/M: 100 | 75 days supply | Qty: 15 | Fill #0

## 2016-09-04 MED FILL — UNIFINE PENTIPS 31GX3/16: 31G X 5 MM | 90 days supply | Qty: 100 | Fill #0

## 2016-09-04 MED FILL — UNIFINE PENTIPS 31GX3/16": 31G X 5 MM | 90 days supply | Qty: 100 | Fill #0

## 2016-09-04 NOTE — Patient Instructions (Addendum)
It was a pleasure to see you today [name].  For your gout I recommend starting to take allopurinol 100mg  daily to hopefully prevent another attack. You can continue taking colchicine while starting this medicine and we will need to check blood work again in a couple weeks.  For your blood sugar you should continue taking metformin 1000mg  twice daily and the insulin injection every night with 20 units of insulin. During this time please try to check your blood sugar 4 times daily. We will need to see you again in about one week to check how this is controlling your blood sugar.

## 2016-09-04 NOTE — Telephone Encounter (Signed)
Received call from pt stating that scheduling had called her for an appt for fereheme on the 20th & she wants to know if this can be done sooner because she feels weak.  Returned call to pt & in addition to weakness she has numbness in her hands & states I have never felt this way.  Per Scheduler, this was the earliest appt available for fereheme.  Will message Onie Kasparek to see if we can get her in sooner.

## 2016-09-04 NOTE — Telephone Encounter (Signed)
Called patient to confirm appt scheduled for 4/20 - patient wants to try to r/s to a sooner date - transferred call to [facility]

## 2016-09-04 NOTE — Telephone Encounter (Signed)
r/s appt per sch message from [facility] - left message with appt date and time.

## 2016-09-04 NOTE — Progress Notes (Signed)
   CC: Follow up for gout flare and hospital visit for HHS  HPI:  [name] is a 66 y.o. woman here today following up her hospitalization late last month with confusion found to be in HHS.   See problem based assessment and plan below for additional details  Past Medical History:  Diagnosis Date  . Allergic rhinitis   . Anemia, iron deficiency 05/03/2011   recieves iron infusions as well as periodic red blood cell transfusions  . Anxiety   . Colon polyps   . Diabetes mellitus without complication ([place])   . Difficulty sleeping    takes trazadone for sleep  . Diverticulosis   . GERD (gastroesophageal reflux disease)   . GI bleeding    Has had bleeding from small bowel AVMs as well as from colon diverticulosis.  . Hepatitis C    Began Harvoni early 82016. completion date late 02/2015.   Marland Kitchen Hypertension   . Hypokalemic alkalosis   . 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 Peripheral vascular disease ([place])   . Stroke [facility]) 2010   no residual problems    Review of Systems:  Review of Systems  Constitutional: Negative for fever.  Respiratory: Negative for shortness of breath.   Cardiovascular: Negative for leg swelling.  Gastrointestinal: Positive for diarrhea. Negative for abdominal pain and nausea.  Musculoskeletal: Negative for joint pain.  Skin: Negative for rash.  Neurological: Negative for focal weakness.    Physical Exam: Physical Exam  Constitutional: She is well-developed, well-nourished, and in no distress.  Cardiovascular: Normal rate and regular rhythm.   Pulmonary/Chest: Effort normal and breath sounds normal.  Abdominal: Soft. There is no tenderness.  Musculoskeletal: She exhibits no edema.  Prominent right 1st MTP bunion, minimally tender, nonerythematous Left leg BKA with prosthetic    Vitals:   09/04/16 1403  BP: 126/71  Pulse: 86  Temp: 98.2 F (36.8 C)  TempSrc: Oral  SpO2: 100%  Weight: 161 lb 6.4 oz (73.2 kg)  Height: 5\' 3"  (1.6  m)    Assessment & Plan:   See Encounters Tab for problem based charting.  Patient discussed with Dr. Dareen Piano

## 2016-09-05 DIAGNOSIS — E119 Type 2 diabetes mellitus without complications: Secondary | ICD-10-CM | POA: Diagnosis not present

## 2016-09-05 NOTE — Assessment & Plan Note (Addendum)
HPI: Her type 2 diabetes has rapidly become severely uncontrolled in a short amount of time since 02/2016 when she was actually on no medication. However she was now recently admitted to the hospital with confusion and found to be in [facility]. She quickly corrected but had a significant insulin requirement during the hospital course. She was discharged on metformin 1000mg  BID and an unknown dose of once daily insulin injection which she has discarded and was unable to tell me at this visit. She is checking CBGs at home about 2-3 times daily with numbers ranging 190-300 with good consistency across different times of day.  A: Uncontrolled type 2 diabetes with recent hospitalization for HHS Hgb A1c 15.5% up from 5.5% last year It is not clear to me why she has developed such uncontrolled hyperglycemia within the past half year. If her diabetes remains hard to control after getting her onto a stable insulin regimen I would consider further investigation such as pancreatic insufficiency or anti GAD antibodies. For now we need to find an appropriate daily dose of insulin, improve diet, and possibly start other non-insulin glycemic agents. Frustratingly there is no clear documentation of her discharge insulin dose to titrate for better control today so I am estimating based on in hospital doses received.  P: Continue metformin 1000mg  BID Insulin glargine 20U [place] qHS Monitor CBG 4 times daily and RTC in 1 week recommended

## 2016-09-05 NOTE — Assessment & Plan Note (Signed)
>>  ASSESSMENT AND PLAN FOR TYPE 2 DIABETES MELLITUS WITH DIABETIC NEUROPATHY (HCC) WRITTEN ON 09/05/2016 12:15 PM BY RICE, LONNI ORN, MD  HPI: Her type 2 diabetes has rapidly become severely uncontrolled in a short amount of time since 02/2016 when she was actually on no medication. However she was now recently admitted to the hospital with confusion and found to be in [facility]. She quickly corrected but had a significant insulin  requirement during the hospital course. She was discharged on metformin  1000mg  BID and an unknown dose of once daily insulin  injection which she has discarded and was unable to tell me at this visit. She is checking CBGs at home about 2-3 times daily with numbers ranging 190-300 with good consistency across different times of day.  A: Uncontrolled type 2 diabetes with recent hospitalization for HHS Hgb A1c 15.5% up from 5.5% last year It is not clear to me why she has developed such uncontrolled hyperglycemia within the past half year. If her diabetes remains hard to control after getting her onto a stable insulin  regimen I would consider further investigation such as pancreatic insufficiency or anti GAD antibodies. For now we need to find an appropriate daily dose of insulin , improve diet, and possibly start other non-insulin  glycemic agents. Frustratingly there is no clear documentation of her discharge insulin  dose to titrate for better control today so I am estimating based on in hospital doses received.  P: Continue metformin  1000mg  BID Insulin  glargine 20U [place] qHS Monitor CBG 4 times daily and RTC in 1 week recommended

## 2016-09-05 NOTE — Assessment & Plan Note (Signed)
PPSV 23 vaccine provided today

## 2016-09-05 NOTE — Assessment & Plan Note (Signed)
HPI: She had another flare of her gout pain during recent hospitalization last month. It has improved with colchicine and currently is not hurting her. Uric acid checked during hospital course was 8.6. Her blood pressure medications were also changed during this hospitalization to hopefully reduce gout flare recurrence. She does have some chronic pain that is more general to the foot due to her neuropathy. This is the 3rd episode of podagra since 02/2016.  A: Recurrent gout affecting the right 1st MTP. With 3 episodes in the past 6 months it is reasonable to start a urate lowering therapy to hopefully avoid additional pain and her colchicine appears to be contributing to some diarrhea.  P: Start allopurinol 100mg  daily Needs uric acid level check in about 6 weeks

## 2016-09-06 ENCOUNTER — Telehealth: Payer: Self-pay | Admitting: Hematology

## 2016-09-06 ENCOUNTER — Other Ambulatory Visit: Payer: Self-pay | Admitting: Internal Medicine

## 2016-09-06 DIAGNOSIS — M6281 Muscle weakness (generalized): Secondary | ICD-10-CM | POA: Diagnosis not present

## 2016-09-06 DIAGNOSIS — M109 Gout, unspecified: Secondary | ICD-10-CM | POA: Diagnosis not present

## 2016-09-06 DIAGNOSIS — N289 Disorder of kidney and ureter, unspecified: Secondary | ICD-10-CM | POA: Diagnosis not present

## 2016-09-06 DIAGNOSIS — E1101 Type 2 diabetes mellitus with hyperosmolarity with coma: Secondary | ICD-10-CM | POA: Diagnosis not present

## 2016-09-06 DIAGNOSIS — R2681 Unsteadiness on feet: Secondary | ICD-10-CM | POA: Diagnosis not present

## 2016-09-06 DIAGNOSIS — I10 Essential (primary) hypertension: Secondary | ICD-10-CM | POA: Diagnosis not present

## 2016-09-06 DIAGNOSIS — R739 Hyperglycemia, unspecified: Secondary | ICD-10-CM | POA: Diagnosis not present

## 2016-09-06 NOTE — Progress Notes (Signed)
Internal Medicine Clinic Attending  Case discussed with Dr. Rice at the time of the visit.  We reviewed the resident's history and exam and pertinent patient test results.  I agree with the assessment, diagnosis, and plan of care documented in the resident's note.  

## 2016-09-06 NOTE — Telephone Encounter (Signed)
Called patient to confirm appt for 4/13 - patient is aware of appt date and time.

## 2016-09-07 ENCOUNTER — Ambulatory Visit (HOSPITAL_BASED_OUTPATIENT_CLINIC_OR_DEPARTMENT_OTHER): Payer: Medicare Other

## 2016-09-07 VITALS — BP 126/71 | HR 85 | Temp 98.2°F | Resp 18

## 2016-09-07 DIAGNOSIS — D500 Iron deficiency anemia secondary to blood loss (chronic): Secondary | ICD-10-CM | POA: Diagnosis not present

## 2016-09-07 DIAGNOSIS — K922 Gastrointestinal hemorrhage, unspecified: Secondary | ICD-10-CM

## 2016-09-07 MED ORDER — DIPHENHYDRAMINE HCL 50 MG/ML IJ SOLN
50.0000 mg | Freq: Once | INTRAMUSCULAR | Status: AC
  Administered 2016-09-07: 50 mg via INTRAVENOUS

## 2016-09-07 MED ORDER — SODIUM CHLORIDE 0.9 % IV SOLN
Freq: Once | INTRAVENOUS | Status: AC
  Administered 2016-09-07: 13:00:00 via INTRAVENOUS

## 2016-09-07 MED ORDER — FAMOTIDINE IN NACL 20-0.9 MG/50ML-% IV SOLN
INTRAVENOUS | Status: AC
  Filled 2016-09-07: qty 50

## 2016-09-07 MED ORDER — DIPHENHYDRAMINE HCL 50 MG/ML IJ SOLN
INTRAMUSCULAR | Status: AC
  Filled 2016-09-07: qty 1

## 2016-09-07 MED ORDER — FAMOTIDINE IN NACL 20-0.9 MG/50ML-% IV SOLN
20.0000 mg | Freq: Once | INTRAVENOUS | Status: AC
  Administered 2016-09-07: 20 mg via INTRAVENOUS

## 2016-09-07 MED ORDER — SODIUM CHLORIDE 0.9 % IV SOLN
510.0000 mg | Freq: Once | INTRAVENOUS | Status: AC
  Administered 2016-09-07: 510 mg via INTRAVENOUS
  Filled 2016-09-07: qty 17

## 2016-09-07 MED ORDER — ACETAMINOPHEN 325 MG PO TABS
ORAL_TABLET | ORAL | Status: AC
  Filled 2016-09-07: qty 2

## 2016-09-07 NOTE — Progress Notes (Signed)
Patient tolerated treatment well and monitored for 30 minutes post transfusion. Patient and vital signs stable upon discharge.  

## 2016-09-07 NOTE — Patient Instructions (Signed)

## 2016-09-10 DIAGNOSIS — G933 Postviral fatigue syndrome: Secondary | ICD-10-CM | POA: Diagnosis not present

## 2016-09-11 ENCOUNTER — Ambulatory Visit (INDEPENDENT_AMBULATORY_CARE_PROVIDER_SITE_OTHER): Payer: Medicare Other | Admitting: Internal Medicine

## 2016-09-11 DIAGNOSIS — Z794 Long term (current) use of insulin: Secondary | ICD-10-CM

## 2016-09-11 DIAGNOSIS — N189 Chronic kidney disease, unspecified: Secondary | ICD-10-CM | POA: Diagnosis not present

## 2016-09-11 DIAGNOSIS — E1165 Type 2 diabetes mellitus with hyperglycemia: Secondary | ICD-10-CM | POA: Diagnosis not present

## 2016-09-11 DIAGNOSIS — E1122 Type 2 diabetes mellitus with diabetic chronic kidney disease: Secondary | ICD-10-CM

## 2016-09-11 DIAGNOSIS — N182 Chronic kidney disease, stage 2 (mild): Principal | ICD-10-CM

## 2016-09-11 DIAGNOSIS — IMO0002 Reserved for concepts with insufficient information to code with codable children: Secondary | ICD-10-CM

## 2016-09-11 MED ORDER — INSULIN GLARGINE 100 UNITS/ML SOLOSTAR PEN: 22.0000 [IU] | PEN_INJECTOR | Freq: Every day | SUBCUTANEOUS | 0 refills | Status: DC

## 2016-09-11 MED FILL — COLCHICINE 0.6 MG TABLET: 0.6 | 7 days supply | Qty: 14 | Fill #0

## 2016-09-11 NOTE — Progress Notes (Signed)
   CC: DM follow up  HPI:  [name] is a 66 y.o. female with a past medical history listed below here today for follow up of her DM.  For details of today's visit and the status of her chronic medical issues please refer to the assessment and plan.   Past Medical History:  Diagnosis Date  . Allergic rhinitis   . Anemia, iron deficiency 05/03/2011   recieves iron infusions as well as periodic red blood cell transfusions  . Anxiety   . Colon polyps   . Diabetes mellitus without complication ([name])   . Difficulty sleeping    takes trazadone for sleep  . Diverticulosis   . GERD (gastroesophageal reflux disease)   . GI bleeding    Has had bleeding from small bowel AVMs as well as from colon diverticulosis.  . Hepatitis C    Began Harvoni early 82016. completion date late 02/2015.   Marland Kitchen Hypertension   . Hypokalemic alkalosis   . PCT (porphyria cutanea tarda) ([place])   . Peripheral vascular disease ([place])   . Stroke [facility]) 2010   no residual problems    Review of Systems:   See HPI  Physical Exam:  Vitals:   09/11/16 1550  BP: (!) 160/99  Pulse: 84  Temp: 98.3 F (36.8 C)  TempSrc: Oral  SpO2: 100%  Weight: 199 lb 1.6 oz (90.3 kg)   Physical Exam  Constitutional: She is well-developed, well-nourished, and in no distress. No distress.  Cardiovascular: Normal rate and regular rhythm.   Pulmonary/Chest: Effort normal and breath sounds normal.  Abdominal: Soft. Bowel sounds are normal. She exhibits no distension. There is no tenderness.  Vitals reviewed.    Assessment & Plan:   See Encounters Tab for problem based charting.  Patient discussed with Dr. Lynnae January

## 2016-09-11 NOTE — Patient Instructions (Signed)
[name],   Please increase the dose of your Lantus to 22 units at night. We will try to get a scan of your abdomen arranged and we will call you when we get it scheduled. Please check your blood sugar 3-4 times a day at home.   Please follow up in clinic in 2 weeks.

## 2016-09-12 ENCOUNTER — Other Ambulatory Visit: Payer: Self-pay | Admitting: Internal Medicine

## 2016-09-12 DIAGNOSIS — E1142 Type 2 diabetes mellitus with diabetic polyneuropathy: Secondary | ICD-10-CM

## 2016-09-12 DIAGNOSIS — I10 Essential (primary) hypertension: Secondary | ICD-10-CM

## 2016-09-13 DIAGNOSIS — M109 Gout, unspecified: Secondary | ICD-10-CM | POA: Diagnosis not present

## 2016-09-13 DIAGNOSIS — E1101 Type 2 diabetes mellitus with hyperosmolarity with coma: Secondary | ICD-10-CM | POA: Diagnosis not present

## 2016-09-13 DIAGNOSIS — R739 Hyperglycemia, unspecified: Secondary | ICD-10-CM | POA: Diagnosis not present

## 2016-09-13 DIAGNOSIS — N289 Disorder of kidney and ureter, unspecified: Secondary | ICD-10-CM | POA: Diagnosis not present

## 2016-09-13 DIAGNOSIS — I10 Essential (primary) hypertension: Secondary | ICD-10-CM | POA: Diagnosis not present

## 2016-09-13 DIAGNOSIS — R2681 Unsteadiness on feet: Secondary | ICD-10-CM | POA: Diagnosis not present

## 2016-09-13 DIAGNOSIS — M6281 Muscle weakness (generalized): Secondary | ICD-10-CM | POA: Diagnosis not present

## 2016-09-13 NOTE — Assessment & Plan Note (Addendum)
Lab Results  Component Value Date   HGBA1C >15.5 (H) 08/18/2016   HGBA1C 5.5 03/08/2016   HGBA1C 6.0 07/15/2015    [name] presents today for follow up of her DM. She is currently on Metformin 1000 mg bid and Lantus 20 units qhs. She brings her meter with her today with CBGs mostly in the 200 range. Denies any episodes of hypoglycemia.  She has been previously very well controlled with A1c 5.5 a year ago. Recent hospitalization she was found to be in HHS with A1c 15.5. It remains unclear why she has so rapidly become uncontrolled with new insulin requirement.   Concerning for possible pancreatic insufficiency but unclear source. CT abdomen from 12/2015 with no abnormalities noted. Pancrease with no focal abnormality or ductal dilatations.   She denies any symptoms today. No nausea, vomiting, diarrhea, abdominal pain, no change in appetite. Possible chronic pancreatitis leading to insufficiency?  Assessment: uncontrolled DM  Plan: Increase lantus to 22 units qhs Will arrange for repeat CT abdomen with contrast

## 2016-09-13 NOTE — Progress Notes (Signed)
Internal Medicine Clinic Attending  Case discussed with Dr. Boswell at the time of the visit.  We reviewed the resident's history and exam and pertinent patient test results.  I agree with the assessment, diagnosis, and plan of care documented in the resident's note.  

## 2016-09-13 NOTE — Assessment & Plan Note (Signed)
>>  ASSESSMENT AND PLAN FOR TYPE 2 DIABETES MELLITUS WITH DIABETIC NEUROPATHY ([facility]) WRITTEN ON 09/13/2016  1:25 PM BY GLENNON LOT, MD  Lab Results  Component Value Date   HGBA1C >15.5 (H) 08/18/2016   HGBA1C 5.5 03/08/2016   HGBA1C 6.0 07/15/2015    [name] presents today for follow up of her DM. She is currently on Metformin  1000 mg bid and Lantus  20 units qhs. She brings her meter with her today with CBGs mostly in the 200 range. Denies any episodes of hypoglycemia.  She has been previously very well controlled with A1c 5.5 a year ago. Recent hospitalization she was found to be in HHS with A1c 15.5. It remains unclear why she has so rapidly become uncontrolled with new insulin  requirement.   Concerning for possible pancreatic insufficiency but unclear source. CT abdomen from 12/2015 with no abnormalities noted. Pancrease with no focal abnormality or ductal dilatations.   She denies any symptoms today. No nausea, vomiting, diarrhea, abdominal pain, no change in appetite. Possible chronic pancreatitis leading to insufficiency?  Assessment: uncontrolled DM  Plan: Increase lantus  to 22 units qhs Will arrange for repeat CT abdomen with contrast

## 2016-09-14 ENCOUNTER — Ambulatory Visit: Payer: Medicare Other

## 2016-09-18 DIAGNOSIS — M6281 Muscle weakness (generalized): Secondary | ICD-10-CM | POA: Diagnosis not present

## 2016-09-18 DIAGNOSIS — N289 Disorder of kidney and ureter, unspecified: Secondary | ICD-10-CM | POA: Diagnosis not present

## 2016-09-18 DIAGNOSIS — R739 Hyperglycemia, unspecified: Secondary | ICD-10-CM | POA: Diagnosis not present

## 2016-09-18 DIAGNOSIS — M109 Gout, unspecified: Secondary | ICD-10-CM | POA: Diagnosis not present

## 2016-09-18 DIAGNOSIS — R2681 Unsteadiness on feet: Secondary | ICD-10-CM | POA: Diagnosis not present

## 2016-09-18 DIAGNOSIS — I10 Essential (primary) hypertension: Secondary | ICD-10-CM | POA: Diagnosis not present

## 2016-09-18 DIAGNOSIS — E1101 Type 2 diabetes mellitus with hyperosmolarity with coma: Secondary | ICD-10-CM | POA: Diagnosis not present

## 2016-09-18 MED FILL — metFORMIN HCL 1000 MG TABS: 1000 | 30 days supply | Qty: 60 | Fill #0

## 2016-09-21 DIAGNOSIS — M109 Gout, unspecified: Secondary | ICD-10-CM | POA: Diagnosis not present

## 2016-09-21 DIAGNOSIS — N289 Disorder of kidney and ureter, unspecified: Secondary | ICD-10-CM | POA: Diagnosis not present

## 2016-09-21 DIAGNOSIS — I10 Essential (primary) hypertension: Secondary | ICD-10-CM | POA: Diagnosis not present

## 2016-09-21 DIAGNOSIS — E1101 Type 2 diabetes mellitus with hyperosmolarity with coma: Secondary | ICD-10-CM | POA: Diagnosis not present

## 2016-09-21 DIAGNOSIS — R2681 Unsteadiness on feet: Secondary | ICD-10-CM | POA: Diagnosis not present

## 2016-09-21 DIAGNOSIS — M6281 Muscle weakness (generalized): Secondary | ICD-10-CM | POA: Diagnosis not present

## 2016-09-21 DIAGNOSIS — R739 Hyperglycemia, unspecified: Secondary | ICD-10-CM | POA: Diagnosis not present

## 2016-09-25 ENCOUNTER — Ambulatory Visit (INDEPENDENT_AMBULATORY_CARE_PROVIDER_SITE_OTHER): Payer: Medicare Other | Admitting: Internal Medicine

## 2016-09-25 ENCOUNTER — Encounter: Payer: Self-pay | Admitting: Internal Medicine

## 2016-09-25 VITALS — BP 179/87 | HR 79 | Temp 98.5°F | Wt 201.4 lb

## 2016-09-25 DIAGNOSIS — M109 Gout, unspecified: Secondary | ICD-10-CM | POA: Diagnosis not present

## 2016-09-25 DIAGNOSIS — E1122 Type 2 diabetes mellitus with diabetic chronic kidney disease: Secondary | ICD-10-CM | POA: Diagnosis not present

## 2016-09-25 DIAGNOSIS — Z79899 Other long term (current) drug therapy: Secondary | ICD-10-CM

## 2016-09-25 DIAGNOSIS — Z794 Long term (current) use of insulin: Secondary | ICD-10-CM | POA: Diagnosis not present

## 2016-09-25 DIAGNOSIS — Z89612 Acquired absence of left leg above knee: Secondary | ICD-10-CM

## 2016-09-25 DIAGNOSIS — R739 Hyperglycemia, unspecified: Secondary | ICD-10-CM | POA: Diagnosis not present

## 2016-09-25 DIAGNOSIS — I129 Hypertensive chronic kidney disease with stage 1 through stage 4 chronic kidney disease, or unspecified chronic kidney disease: Secondary | ICD-10-CM

## 2016-09-25 DIAGNOSIS — E1101 Type 2 diabetes mellitus with hyperosmolarity with coma: Secondary | ICD-10-CM | POA: Diagnosis not present

## 2016-09-25 DIAGNOSIS — M6281 Muscle weakness (generalized): Secondary | ICD-10-CM | POA: Diagnosis not present

## 2016-09-25 DIAGNOSIS — R2681 Unsteadiness on feet: Secondary | ICD-10-CM | POA: Diagnosis not present

## 2016-09-25 DIAGNOSIS — IMO0002 Reserved for concepts with insufficient information to code with codable children: Secondary | ICD-10-CM

## 2016-09-25 DIAGNOSIS — N182 Chronic kidney disease, stage 2 (mild): Secondary | ICD-10-CM | POA: Diagnosis not present

## 2016-09-25 DIAGNOSIS — E1165 Type 2 diabetes mellitus with hyperglycemia: Secondary | ICD-10-CM | POA: Diagnosis not present

## 2016-09-25 DIAGNOSIS — I10 Essential (primary) hypertension: Secondary | ICD-10-CM

## 2016-09-25 DIAGNOSIS — F17210 Nicotine dependence, cigarettes, uncomplicated: Secondary | ICD-10-CM | POA: Diagnosis not present

## 2016-09-25 DIAGNOSIS — N189 Chronic kidney disease, unspecified: Secondary | ICD-10-CM

## 2016-09-25 DIAGNOSIS — N289 Disorder of kidney and ureter, unspecified: Secondary | ICD-10-CM | POA: Diagnosis not present

## 2016-09-25 LAB — POCT GLYCOSYLATED HEMOGLOBIN (HGB A1C): Hemoglobin A1C: 8.3

## 2016-09-25 LAB — GLUCOSE, CAPILLARY: Glucose-Capillary: 124 mg/dL — ABNORMAL HIGH (ref 65–99)

## 2016-09-25 MED ORDER — ACCU-CHEK FASTCLIX LANCETS MISC: 11 refills | Status: DC

## 2016-09-25 MED ORDER — LOSARTAN POTASSIUM 100 MG PO TABS: 50.0000 mg | ORAL_TABLET | Freq: Every day | ORAL | 2 refills | Status: DC

## 2016-09-25 MED FILL — ACCU-CHEK FASTCLIX LANCETS: 90 days supply | Qty: 302 | Fill #0

## 2016-09-25 MED FILL — COLCHICINE 0.6 MG TABLET: 0.6 | 7 days supply | Qty: 14 | Fill #1

## 2016-09-25 NOTE — Assessment & Plan Note (Signed)
>>  ASSESSMENT AND PLAN FOR TYPE 2 DIABETES MELLITUS WITH DIABETIC NEUROPATHY ([facility]) WRITTEN ON 09/25/2016  4:57 PM BY AHMED, TASRIF, MD  DM II is in good control, numbers ranging from 88-200, mostly in mid 100's. Only has been checking in the mornings. On lantus  22 + metformin  1000mg  bid.  Not sure why hgba1c was >15.5 last visit. Will recheck it today : 8.3 Continue same regimen. Check BMET for the CT scan that was ordered previously.  f/up in 6 weeks.

## 2016-09-25 NOTE — Assessment & Plan Note (Addendum)
DM II is in good control, numbers ranging from 88-200, mostly in mid 100's. Only has been checking in the mornings. On lantus 22 + metformin 1000mg  bid.  Not sure why hgba1c was >15.5 last visit. Will recheck it today : 8.3 Continue same regimen. Check BMET for the CT scan that was ordered previously.  f/up in 6 weeks.

## 2016-09-25 NOTE — Assessment & Plan Note (Addendum)
Vitals:   09/25/16 1505 09/25/16 1527  BP: (!) 173/100 (!) 179/87  Pulse: 86 79  Temp: 98.5 F (36.9 C)    BP elevated. On losartan 50mg  daily + atenolol 50mg  daily.   Will increase losartan to 100mg  daily. f/up in 6 weeks.

## 2016-09-25 NOTE — Progress Notes (Signed)
   CC: DM II follow up   HPI:  [name] is a 66 y.o. with PMH as listed below is here for DM II f/up  Past Medical History:  Diagnosis Date  . Allergic rhinitis   . Anemia, iron deficiency 05/03/2011   recieves iron infusions as well as periodic red blood cell transfusions  . Anxiety   . Colon polyps   . Diabetes mellitus without complication ([place])   . Difficulty sleeping    takes trazadone for sleep  . Diverticulosis   . GERD (gastroesophageal reflux disease)   . GI bleeding    Has had bleeding from small bowel AVMs as well as from colon diverticulosis.  . Hepatitis C    Began Harvoni early 82016. completion date late 02/2015.   Marland Kitchen Hypertension   . Hypokalemic alkalosis   . PCT (porphyria cutanea tarda) ([place])   . Peripheral vascular disease ([place])   . Stroke [facility]) 2010   no residual problems   Patient was previously well controlled, however, her hgba1c last visit was >15.5 (compared to 5.5 on 10/201). Lanuts increased to 22 units from 20, along with continuing metformin. CT abdomen ordered to look at pancreatic insufficiency. CT scheduled for 09/27/16.   HTN - BP elevated 173/100. On atenolol 50mg  daily + losartan 50mg  daily. Compliant with meds.   Review of Systems:   Review of Systems  Constitutional: Negative for chills and fever.  Respiratory: Negative for cough.   Cardiovascular: Negative for chest pain.  Genitourinary: Negative for dysuria.  Neurological: Negative for dizziness.  Psychiatric/Behavioral: Negative for depression.     Physical Exam:  Vitals:   09/25/16 1505 09/25/16 1527  BP: (!) 173/100 (!) 179/87  Pulse: 86 79  Temp: 98.5 F (36.9 C)   TempSrc: Oral   SpO2: 99%   Weight: 201 lb 6.4 oz (91.4 kg)      Physical Exam  Constitutional: She is oriented to person, place, and time. She appears well-developed and well-nourished. No distress.  Cardiovascular: Normal rate and regular rhythm.  Exam reveals no gallop and no friction rub.     No murmur heard. Respiratory: Effort normal and breath sounds normal.  Musculoskeletal:  Left leg AKA. Right leg no edema, good pulses. No wounds.   Neurological: She is alert and oriented to person, place, and time.  Skin: She is not diaphoretic.    Assessment & Plan:   See Encounters Tab for problem based charting.  Patient discussed with Dr. Angelia Mould

## 2016-09-25 NOTE — Patient Instructions (Signed)
We will increase your losartan to 100mg  daily.  Keep taking lantus 22 units and metformin.  Will call you with the CT results.  Will repeat labs today.

## 2016-09-26 DIAGNOSIS — S78119A Complete traumatic amputation at level between unspecified hip and knee, initial encounter: Secondary | ICD-10-CM | POA: Diagnosis not present

## 2016-09-26 LAB — BASIC METABOLIC PANEL
BUN/Creatinine Ratio: 19 (ref 12–28)
BUN: 14 mg/dL (ref 8–27)
CALCIUM: 7.7 mg/dL — AB (ref 8.7–10.3)
CO2: 22 mmol/L (ref 18–29)
Chloride: 104 mmol/L (ref 96–106)
Creatinine, Ser: 0.75 mg/dL (ref 0.57–1.00)
GFR, EST AFRICAN AMERICAN: 98 mL/min/{1.73_m2} (ref >59)
GFR, EST NON AFRICAN AMERICAN: 85 mL/min/{1.73_m2} (ref >59)
Glucose: 132 mg/dL — ABNORMAL HIGH (ref 65–99)
Potassium: 3.7 mmol/L (ref 3.5–5.2)
Sodium: 143 mmol/L (ref 134–144)

## 2016-09-27 ENCOUNTER — Ambulatory Visit (HOSPITAL_COMMUNITY): Payer: Medicare Other

## 2016-09-28 NOTE — Progress Notes (Signed)
[facility] OFFICE PROGRESS NOTE   Patricia Grieve, MD [place] [address]  DIAGNOSIS: Iron deficiency anemia due to chronic blood loss.   PROBLEM LIST:  1. Recurrent iron-deficiency anemia secondary to gastrointestinal bleeding (as noted above). [name] also has required red cell transfusions in the past, most recently early December 2012. She underwent a small bowel enteroscopy by Dr. Dan Humphreys at [facility] on 08/13/2011. The enteroscope was passed up to 6 feet into the jejunum. He saw multiple AVMs, which were ablated. Two of the large AVMs bled during APC. These were in the 4th part of the duodenum very close to the ligament of Treitz. The remainder of the AVMs were in the proximal jejunum. In addition to the small bowel AV malformations, which were treated with APC, he saw some small esophageal varices which were not bleeding and also evidence of mild portal hypertensive gastropathy.  2. History of duodenal arteriovenous malformation.  3. History of hepatitis C infection.  4. Gastroesophageal reflux disease.  5. Depression.  6. Hypertension.  7. History of right Bell's palsy.  8. Status post left above-knee amputation secondary to necrotizing fasciitis 03/06/2009.  9. Neurodermatitis.  10. Previous history of alcohol usage.  11. Admission to the hospital from 12/20/2011 through 12/25/2011 for hematochezia felt to be secondary to a diverticular bleed, requiring 2 units of packed red cells.  12. Diverticulosis.  13. Elevated alpha fetoprotein, 72.3 on 11/19/2011 and 76.0 on 01/11/2012.  14. Abnormal MRI of the liver from 11/28/2011 showing diffuse and markedly low signal intensity throughout the liver as well as diffuse low signal intensity in the adrenal glands. 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 There were no findings for cirrhosis, portal hypertension,  splenomegaly or ascites, not were there any worrisome enhancing liver lesions.  15. Systolic ejection murmur.   CHIEF COMPLAIN: Follow up anemia  PRIOR TREATMENT: The patient has been heavily dependent upon IV Feraheme. Over the past year or so she has been receiving about 1 Feraheme infusion monthly on average. The patient's most recent infusions of IV Feraheme 510 mg occurred on the following dates: 06/05/2011, 07/03/2011, 07/31/2011, 08/17/2011, 08/28/2011. The patient received IV Feraheme 1020 mg on 10/02/2011 01/30/2012, 06/06/2012 and 09/10/2012, 05/2013, 07/2013, 08/2013, 09/2013, 11/12/2013, 01/15/2014 and 03/16/2014, 09/16/2014 and 09/23/2014, 11/2014 , 02/24/2015,  04/07/2015, 08/03/2015, 10/03/2015, 12/16/2015, 01/2016 and 02/27/2016  CURRENT TREATMENT: As noted above. Feraheme prn, will be cautious for iron supplement due to PCT  INTERVAL HISTORY:   [name] 66 y.o. female with history of recurrent iron-deficiency anemia due to GI bleeding is here for follow-up.   She presents to the clinic today with her new motorized wheelchair. She is currently experiencing nerve pain. She feels better after feraheme. Her Skin rash is the same even after feraheme. She was in the hospital in March for her diabetes. She reports swelling in her right foot since increase of insulin. She reports she elevates her foot. She does not use compression socks but she can get them. F/u with PCP on the 30th of May.   MEDICAL HISTORY: Past Medical History:  Diagnosis Date  . Allergic rhinitis   . Anemia, iron deficiency 05/03/2011   recieves iron infusions as well as periodic red blood cell transfusions  . Anxiety   . Colon polyps   . Diabetes mellitus without complication ([place])   . Difficulty sleeping    takes trazadone for sleep  .  Diverticulosis   . GERD (gastroesophageal reflux disease)   . GI bleeding    Has had bleeding from small bowel AVMs as well as from colon diverticulosis.  . Hepatitis C    Began  Harvoni early 82016. completion date late 02/2015.   Marland Kitchen Hypertension   . Hypokalemic alkalosis   . PCT (porphyria cutanea tarda) ([place])   . Peripheral vascular disease ([place])   . Stroke [facility]) 2010   no residual problems     ALLERGIES:  is allergic to ciprofloxacin; morphine and related; penicillins; codeine; lisinopril; and feraheme [ferumoxytol].  MEDICATIONS: has a current medication list which includes the following prescription(s): aspirin ec, atenolol, cetirizine, cholecalciferol, colchicine, fluoxetine, gabapentin, hydrochlorothiazide, losartan, omeprazole, potassium chloride, trazodone, vitamin c, ferrous sulfate, and tramadol.  SURGICAL HISTORY:  Past Surgical History:  Procedure Laterality Date  . ABDOMINAL HYSTERECTOMY    . APPLICATION OF WOUND VAC N/A 06/21/2014   Procedure: APPLICATION OF WOUND VAC;  Surgeon: Coralie Keens, MD;  Location: [facility];  Service: General;  Laterality: N/A;  . CARPAL TUNNEL RELEASE     rt hand  . ENTEROSCOPY N/A 12/04/2012   Procedure: ENTEROSCOPY;  Surgeon: Beryle Beams, MD;  Location: [facility];  Service: Endoscopy;  Laterality: N/A;  . ENTEROSCOPY N/A 12/18/2012   Procedure: ENTEROSCOPY;  Surgeon: Beryle Beams, MD;  [facility]: [facility];  Service: Endoscopy;  Laterality: N/A;  . ENTEROSCOPY N/A 02/25/2015   Procedure: ENTEROSCOPY;  Surgeon: Inda Castle, MD;  Location: [facility] END[facility]COPY;  Service: Endoscopy;  Laterality: N/A;  . ESOPHAGOGASTRODUODENOSCOPY  05/05/2011   Procedure: ESOPHAGOGASTRODUODENOSCOPY (EGD);  Surgeon: Zenovia Jarred, MD;  Location: [facility];  Service: Gastroenterology;  Laterality: N/A;  Dr. Hilarie Fredrickson will do procedure for Dr. Benson Norway Saturday.  . ESOPHAGOGASTRODUODENOSCOPY  05/08/2011   Procedure: ESOPHAGOGASTRODUODENOSCOPY (EGD);  Surgeon: Beryle Beams;  [facility];  Service: Endoscopy;  Laterality: N/A;  . ESOPHAGOGASTRODUODENOSCOPY  06/07/2011   Procedure: ESOPHAGOGASTRODUODENOSCOPY (EGD);  Surgeon: Beryle Beams, MD;  Location: [facility];  Service: Endoscopy;  Laterality: N/A;  . ESOPHAGOGASTRODUODENOSCOPY  12/20/2011   Procedure: ESOPHAGOGASTRODUODENOSCOPY (EGD);  Surgeon: Beryle Beams, MD;  Location: [facility];  Service: Endoscopy;  Laterality: N/A;  . EYE SURGERY Bilateral    cataracts  . FLEXIBLE SIGMOIDOSCOPY  12/21/2011   Procedure: FLEXIBLE SIGMOIDOSCOPY;  Surgeon: Beryle Beams, MD;  [facility];  Service: Endoscopy;  Laterality: N/A;  . HOT HEMOSTASIS  06/07/2011   Procedure: HOT HEMOSTASIS (ARGON PLASMA COAGULATION/BICAP);  Surgeon: Beryle Beams, MD;  Location: [facility];  Service: Endoscopy;  Laterality: N/A;  . INCISIONAL HERNIA REPAIR N/A 06/01/2014   Procedure: ATTEMPTED LAPAROSCOPIC AND OPEN INCISIONAL HERNIA REPAIR WITH MESH;  Surgeon: Coralie Keens, MD;  Location: [facility];  Service: General;  Laterality: N/A;  . INSERTION OF MESH N/A 06/01/2014   Procedure: INSERTION OF MESH;  Surgeon: Coralie Keens, MD;  Location: [facility];  Service: General;  Laterality: N/A;  . LAPAROTOMY  02/16/2012   Procedure: EXPLORATORY LAPAROTOMY;  Surgeon: Edward Jolly, MD;  Location[facility];  Service: General;  Laterality: N/A;  oversewing of anastomotic leak and rigid sigmoidoscopy  . LAPAROTOMY N/A 06/21/2014   Procedure: ABDOMINAL WOUND EXPLORATION;  Surgeon: Coralie Keens, MD;  Location: [facility];  Service: General;  Laterality: N/A;  . LEG AMPUTATION ABOVE KNEE    . PARTIAL COLECTOMY  02/15/2012   Procedure: PARTIAL COLECTOMY;  Surgeon: Harl Bowie, MD;  [facility];  Service: General;  Laterality: N/A;  .  TONSILLECTOMY     Allergies as of 10/01/2016      Reactions   Ciprofloxacin Hives, Swelling   Morphine And Related Other (See Comments)   Makes pt sad and paranoid   Penicillins Hives, Swelling   Has patient had a PCN reaction causing immediate rash, facial/tongue/throat swelling, SOB or lightheadedness with hypotension:YES Has patient had a PCN reaction  causing severe rash involving mucus membranes or skin necrosis:UNSURE Has patient had a PCN reaction that required hospitalization:YES Has patient had a PCN reaction occurring within the last 10 years:No If all of the above answers are "NO", then may proceed with Cephalosporin use.   Codeine Hives, Swelling   Lisinopril Cough   New onset dry cough after starting lisinopril. Resolved upon switching to losartan.   Feraheme [ferumoxytol] Itching   Tolerated Feraheme 6/26 & 7/21 pre-medications prior to medication      Medication List       Accurate as of 10/01/16  5:33 PM. Always use your most recent med list.          ACCU-CHEK FASTCLIX LANCETS Misc Use to test blood glucose 3 times daily.   allopurinol 100 MG tablet Commonly known as:  ZYLOPRIM Take 1 tablet (100 mg total) by mouth daily.   aspirin EC 81 MG tablet Take 1 tablet (81 mg total) by mouth daily.   atenolol 50 MG tablet Commonly known as:  TENORMIN TAKE 1 TABLET BY MOUTH EVERY MORNING   cetirizine 10 MG tablet Commonly known as:  ZYRTEC TAKE 1 TABLET BY MOUTH DAILY   cholecalciferol 1000 units tablet Commonly known as:  VITAMIN D TAKE ONE TABLET BY MOUTH DAILY.   colchicine 0.6 MG tablet Take 1 tablet (0.6 mg total) by mouth 2 (two) times daily.   feeding supplement (GLUCERNA SHAKE) Liqd Take 237 mLs by mouth 3 (three) times daily between meals.   ferrous sulfate 325 (65 FE) MG EC tablet Take 1 tablet (325 mg total) by mouth daily after breakfast.   FLUoxetine 40 MG capsule Commonly known as:  PROZAC TAKE 1 CAPSULE BY MOUTH DAILY   gabapentin 400 MG capsule Commonly known as:  NEURONTIN TAKE 2 CAPSULES BY MOUTH THREE TIMES A DAY   insulin glargine 100 unit/mL Sopn Commonly known as:  LANTUS Inject 0.22 mLs (22 Units total) into the skin at bedtime.   Insulin Pen Needle 31G X 5 MM Misc 1 Syringe by Does not apply route at bedtime.   losartan 100 MG tablet Commonly known as:  COZAAR Take 0.5  tablets (50 mg total) by mouth daily.   metFORMIN 1000 MG tablet Commonly known as:  GLUCOPHAGE Take 1 tablet (1,000 mg total) by mouth 2 (two) times daily with a meal.   omeprazole 20 MG capsule Commonly known as:  PRILOSEC TAKE 1 CAPSULE BY MOUTH EVERY DAY **STOP NEXIUM**   potassium chloride 20 MEQ/15ML (10%) Soln TAKE 15ML BY MOUTH EVERY DAY   traZODone 50 MG tablet Commonly known as:  DESYREL TAKE 1 TABLET BY MOUTH EVERY NIGHT AT BEDTIME   vitamin C 500 MG tablet Commonly known as:  ASCORBIC ACID Take 500 mg by mouth every morning.        REVIEW OF SYSTEMS:   Constitutional: Denies fevers, chills or abnormal weight loss;  (+) sever fatigue Eyes: Denies blurriness of vision Ears, nose, mouth, throat, and face: Denies mucositis or sore throat Respiratory: Denies cough, dyspnea or wheezes Cardiovascular: Denies palpitation, chest discomfort (+) right knee discomfort and swelling (+)  Right foot swelling Gastrointestinal:  Denies nausea, heartburn or change in bowel habits, hernia is painful and bleeds sometime the overlying skin. Skin: (+) no changes in skin rash  Lymphatics: Denies new lymphadenopathy or easy bruising Neurological:Denies numbness, tingling or new weaknesses Behavioral/Psych: Mood is stable, no new changes  All other systems were reviewed with the patient and are negative.  PHYSICAL EXAMINATION: ECOG PERFORMANCE STATUS: 2 BP (!) 155/93 (BP Location: Left Arm, Patient Position: Sitting) Comment: The nurse Thu is aware of the bp.  Pulse 89   Temp 99.2 F (37.3 C) (Oral)   Resp 18   Ht 5\' 3"  (1.6 m)   Wt 164 lb 6.4 oz (74.6 kg) Comment: 35 lbs deduted for leg.  SpO2 99%   BMI 29.12 kg/m   General appearance: alert, appears stated age, no distress and moderately obese  Skin: she has multiple skin pigmentations from previous rash on her arms and legs.  Head: Normocephalic, without obvious abnormality, atraumatic  Neck: no adenopathy, supple,  symmetrical, trachea midline and thyroid not enlarged, symmetric, no tenderness/mass/nodules  Lymph nodes: Cervical adenopathy: None appreciated  Heart:regular rate and rhythm, S1, S2 normal and systolic murmur 2/6 Lung:chest clear, no wheezing, rales, normal symmetric air entry, Heart exam - S1, S2 normal, no murmur, no gallop, rate regular  Abdomen: soft, distended, normal bowel sounds, nontender EXT:No peripheral edema on right extremity; Left above the knee amputation. She is wearing a prothesis.   Labs:  CBC Latest Ref Rng & Units 10/01/2016 08/28/2016 08/20/2016  WBC 3.9 - 10.3 10e3/uL 7.8 4.7 7.3  Hemoglobin 11.6 - 15.9 g/dL 11.0(L) 9.9(L) 9.3(L)  Hematocrit 34.8 - 46.6 % 34.3(L) 30.8(L) 27.3(L)  Platelets 145 - 400 10e3/uL 321 292 293    CMP Latest Ref Rng & Units 09/25/2016 08/20/2016 08/19/2016  Glucose 65 - 99 mg/dL 132(H) 265(H) 349(H)  BUN 8 - 27 mg/dL 14 14 30(H)  Creatinine 0.57 - 1.00 mg/dL 0.75 0.96 1.29(H)  Sodium 134 - 144 mmol/L 143 134(L) 134(L)  Potassium 3.5 - 5.2 mmol/L 3.7 3.9 4.2  Chloride 96 - 106 mmol/L 104 106 106  CO2 18 - 29 mmol/L 22 21(L) 22  Calcium 8.7 - 10.3 mg/dL 7.7(L) 8.2(L) 8.3(L)  Total Protein 6.5 - 8.1 g/dL - - -  Total Bilirubin 0.3 - 1.2 mg/dL - - -  Alkaline Phos 38 - 126 U/L - - -  AST 15 - 41 U/L - - -  ALT 14 - 54 U/L - - -   Results for Patricia, [name] (MRN [id number]) as of 10/01/2016 15:17  Ref. Range 07/02/2016 14:52 08/28/2016 13:51 10/01/2016 13:26  Ferritin Latest Ref Range: 9 - 269 ng/ml 24 11 42     RADIOGRAPHIC STUDIES: Mammogram Digital Screening Bilateral 09/21/15 IMPRESSION: No mammographic evidence of malignancy. A result letter of this screening mammogram will be mailed directly to the patient.  ASSESSMENT: [name] 66 y.o. female with a history of Iron deficiency anemia due to chronic blood loss   PLAN:  1. Iron deficiency anemia secondary to GI loss -History of multiple GI bleeding in 2016, required blood  transfusion, hospitalization and IV Feraheme -she required blood transfusion periodically, much less lately  -She has been receiving IV Feraheme every 2 months on average lately -I previously recommended close follow-up, we'll check her CBC and ferritin every 4 weeks -Due to her PCT, we'll be cautious with IV iron. Her PCT has resolved lately due to her successfully treated hepatitis C, I'll be  okay to be more liberal to replace his iron deficiency. -Today's ferritin study results still pending. I have encouraged the patient to begin taking oral iron. I have prescribed ferrous sulfate 325 mg daily today. I'll monitor his our level closely, to avoid over replacement. -IV Feraheme if she is anemic or significant low iron level. -Labs reviewed, Hgb is 11 today 10/01/16 and Ferritin 42 today. - Will not need IV Feraheme today.  2. Porphyria cutaneous tarda (PCT) -per pt, she had significant sun sensitivity which caused skin blisters on her arms and legs, she has multiple skin pigmentations from healed skin rash on her arms. -Both her urine and serum porphyria, especially uroporphyrin and coproporphyrin all elevated.  -This is likely secondary to her hepatitis C -She Has previously completed treatment for hepatitis C. Her PCT has overall improved     3. chronic hepatitis C infection -She previously tested positive for hepatitis C in 2009. She has previously completed treatment for hepatitis C in 2016 and repeated test was negative.  -liver function has previously improved  -She was previously seen by GI Dr. Deatra Ina before, who has left the practice. She will set up an appointment with [facility]. She was previously given their clinic's information.  4. DM2. --Management per PCP's office. Off metformin now  -Glucose was previously 341. I previously encouraged her to restart her metformin. She has appointment to see her primary care physician later in the week.  -Was hospitalized in March for high  glucose levels greater than 1000.   5. Left BKA -She has a prosthesis, she is wheelchair bound most of time, I previously encouraged her to continue physical therapy  6. Smoking cessation -She is still actively smoking, not ready to quit   7. Right knee pain and leg swelling  -likely secondary to osteoarthritis. She will follow up with PCP  -I encouraged her to use compression stock for edema, she will think about it   Plan  -lab and f/u in 4 months -lab in 2 months, she will call us after lab to see if she needs iv feraheem -She'll continue oral ferrous sulfate.  All questions were answered. The patient knows to call the clinic with any problems, questions or concerns. We can certainly see the patient much sooner if necessary.  I spent 20 minutes counseling the patient face to face. The total time spent in the appointment was 25 minutes.  This document serves as a record of services personally performed by Truitt Merle, MD. It was created on her behalf by Joslyn Devon, a trained medical scribe. The creation of this record is based on the scribe's personal observations and the provider's statements to them. This document has been checked and approved by the attending provider.   Truitt Merle 10/01/2016

## 2016-10-01 ENCOUNTER — Ambulatory Visit (HOSPITAL_BASED_OUTPATIENT_CLINIC_OR_DEPARTMENT_OTHER): Payer: Medicare Other | Admitting: Hematology

## 2016-10-01 ENCOUNTER — Encounter: Payer: Self-pay | Admitting: Hematology

## 2016-10-01 ENCOUNTER — Other Ambulatory Visit (HOSPITAL_BASED_OUTPATIENT_CLINIC_OR_DEPARTMENT_OTHER): Payer: Medicare Other

## 2016-10-01 ENCOUNTER — Telehealth: Payer: Self-pay | Admitting: Hematology

## 2016-10-01 VITALS — BP 155/93 | HR 89 | Temp 99.2°F | Resp 18 | Ht 63.0 in | Wt 164.4 lb

## 2016-10-01 DIAGNOSIS — M25561 Pain in right knee: Secondary | ICD-10-CM

## 2016-10-01 DIAGNOSIS — R609 Edema, unspecified: Secondary | ICD-10-CM

## 2016-10-01 DIAGNOSIS — N289 Disorder of kidney and ureter, unspecified: Secondary | ICD-10-CM | POA: Diagnosis not present

## 2016-10-01 DIAGNOSIS — M6281 Muscle weakness (generalized): Secondary | ICD-10-CM | POA: Diagnosis not present

## 2016-10-01 DIAGNOSIS — E1101 Type 2 diabetes mellitus with hyperosmolarity with coma: Secondary | ICD-10-CM | POA: Diagnosis not present

## 2016-10-01 DIAGNOSIS — M109 Gout, unspecified: Secondary | ICD-10-CM | POA: Diagnosis not present

## 2016-10-01 DIAGNOSIS — Z720 Tobacco use: Secondary | ICD-10-CM

## 2016-10-01 DIAGNOSIS — R2681 Unsteadiness on feet: Secondary | ICD-10-CM | POA: Diagnosis not present

## 2016-10-01 DIAGNOSIS — D500 Iron deficiency anemia secondary to blood loss (chronic): Secondary | ICD-10-CM

## 2016-10-01 DIAGNOSIS — B182 Chronic viral hepatitis C: Secondary | ICD-10-CM

## 2016-10-01 DIAGNOSIS — I10 Essential (primary) hypertension: Secondary | ICD-10-CM | POA: Diagnosis not present

## 2016-10-01 DIAGNOSIS — E119 Type 2 diabetes mellitus without complications: Secondary | ICD-10-CM

## 2016-10-01 DIAGNOSIS — R739 Hyperglycemia, unspecified: Secondary | ICD-10-CM | POA: Diagnosis not present

## 2016-10-01 LAB — CBC WITH DIFFERENTIAL/PLATELET
BASO%: 0.7 % (ref 0.0–2.0)
Basophils Absolute: 0.1 10*3/uL (ref 0.0–0.1)
EOS%: 1.3 % (ref 0.0–7.0)
Eosinophils Absolute: 0.1 10*3/uL (ref 0.0–0.5)
HCT: 34.3 % — ABNORMAL LOW (ref 34.8–46.6)
HGB: 11 g/dL — ABNORMAL LOW (ref 11.6–15.9)
LYMPH%: 20.5 % (ref 14.0–49.7)
MCH: 27.9 pg (ref 25.1–34.0)
MCHC: 32 g/dL (ref 31.5–36.0)
MCV: 87.1 fL (ref 79.5–101.0)
MONO#: 1 10*3/uL — AB (ref 0.1–0.9)
MONO%: 12.3 % (ref 0.0–14.0)
NEUT#: 5.1 10*3/uL (ref 1.5–6.5)
NEUT%: 65.2 % (ref 38.4–76.8)
PLATELETS: 321 10*3/uL (ref 145–400)
RBC: 3.94 10*6/uL (ref 3.70–5.45)
RDW: 20.5 % — ABNORMAL HIGH (ref 11.2–14.5)
WBC: 7.8 10*3/uL (ref 3.9–10.3)
lymph#: 1.6 10*3/uL (ref 0.9–3.3)

## 2016-10-01 LAB — FERRITIN: Ferritin: 42 ng/ml (ref 9–269)

## 2016-10-01 NOTE — Progress Notes (Signed)
Internal Medicine Clinic Attending  Case discussed with Dr. Ahmed at the time of the visit.  We reviewed the resident's history and exam and pertinent patient test results.  I agree with the assessment, diagnosis, and plan of care documented in the resident's note. 

## 2016-10-01 NOTE — Telephone Encounter (Signed)
Appointments scheduled per 10/01/16 los. Patient was given a copy of the AVS report and appointment schedule per 10/01/16 los. °

## 2016-10-03 ENCOUNTER — Ambulatory Visit (HOSPITAL_COMMUNITY)
Admission: RE | Admit: 2016-10-03 | Discharge: 2016-10-03 | Disposition: A | Discharge status: Home / Self Care | Payer: Medicare Other | Source: Ambulatory Visit | Attending: Internal Medicine | Admitting: Internal Medicine

## 2016-10-03 DIAGNOSIS — N182 Chronic kidney disease, stage 2 (mild): Secondary | ICD-10-CM | POA: Insufficient documentation

## 2016-10-03 DIAGNOSIS — IMO0002 Reserved for concepts with insufficient information to code with codable children: Secondary | ICD-10-CM

## 2016-10-03 DIAGNOSIS — R935 Abnormal findings on diagnostic imaging of other abdominal regions, including retroperitoneum: Secondary | ICD-10-CM | POA: Diagnosis not present

## 2016-10-03 DIAGNOSIS — E1122 Type 2 diabetes mellitus with diabetic chronic kidney disease: Secondary | ICD-10-CM | POA: Diagnosis not present

## 2016-10-03 DIAGNOSIS — E1165 Type 2 diabetes mellitus with hyperglycemia: Secondary | ICD-10-CM | POA: Diagnosis not present

## 2016-10-03 MED ORDER — IOPAMIDOL (ISOVUE-300) INJECTION 61%
INTRAVENOUS | Status: AC
  Administered 2016-10-03: 100 mL
  Filled 2016-10-03: qty 100

## 2016-10-03 MED FILL — ALLOPURINOL 100 MG TABLET: 100 | 30 days supply | Qty: 30 | Fill #1

## 2016-10-04 ENCOUNTER — Telehealth: Payer: Self-pay

## 2016-10-04 DIAGNOSIS — R2681 Unsteadiness on feet: Secondary | ICD-10-CM | POA: Diagnosis not present

## 2016-10-04 DIAGNOSIS — E1165 Type 2 diabetes mellitus with hyperglycemia: Secondary | ICD-10-CM

## 2016-10-04 DIAGNOSIS — I10 Essential (primary) hypertension: Secondary | ICD-10-CM | POA: Diagnosis not present

## 2016-10-04 DIAGNOSIS — E1142 Type 2 diabetes mellitus with diabetic polyneuropathy: Secondary | ICD-10-CM

## 2016-10-04 DIAGNOSIS — M109 Gout, unspecified: Secondary | ICD-10-CM

## 2016-10-04 DIAGNOSIS — E1122 Type 2 diabetes mellitus with diabetic chronic kidney disease: Secondary | ICD-10-CM

## 2016-10-04 DIAGNOSIS — M6281 Muscle weakness (generalized): Secondary | ICD-10-CM | POA: Diagnosis not present

## 2016-10-04 DIAGNOSIS — IMO0002 Reserved for concepts with insufficient information to code with codable children: Secondary | ICD-10-CM

## 2016-10-04 DIAGNOSIS — E1101 Type 2 diabetes mellitus with hyperosmolarity with coma: Secondary | ICD-10-CM | POA: Diagnosis not present

## 2016-10-04 DIAGNOSIS — E559 Vitamin D deficiency, unspecified: Secondary | ICD-10-CM

## 2016-10-04 DIAGNOSIS — R739 Hyperglycemia, unspecified: Secondary | ICD-10-CM | POA: Diagnosis not present

## 2016-10-04 DIAGNOSIS — N289 Disorder of kidney and ureter, unspecified: Secondary | ICD-10-CM | POA: Diagnosis not present

## 2016-10-04 DIAGNOSIS — N182 Chronic kidney disease, stage 2 (mild): Principal | ICD-10-CM

## 2016-10-04 MED ORDER — COLCHICINE 0.6 MG PO TABS: 0.6000 mg | ORAL_TABLET | Freq: Two times a day (BID) | ORAL | 2 refills | Status: DC

## 2016-10-04 MED ORDER — TRAZODONE HCL 50 MG PO TABS: 50.0000 mg | ORAL_TABLET | Freq: Every day | ORAL | 3 refills | Status: DC

## 2016-10-04 MED ORDER — INSULIN GLARGINE 100 UNITS/ML SOLOSTAR PEN: 22.0000 [IU] | PEN_INJECTOR | Freq: Every day | SUBCUTANEOUS | 0 refills | Status: DC

## 2016-10-04 MED ORDER — LOSARTAN POTASSIUM 100 MG PO TABS: 100.0000 mg | ORAL_TABLET | Freq: Every day | ORAL | 1 refills | Status: DC

## 2016-10-04 MED ORDER — ATENOLOL 50 MG PO TABS: 50.0000 mg | ORAL_TABLET | Freq: Every morning | ORAL | 1 refills | Status: DC

## 2016-10-04 MED ORDER — OMEPRAZOLE 20 MG PO CPDR: DELAYED_RELEASE_CAPSULE | ORAL | 5 refills | Status: DC

## 2016-10-04 MED ORDER — ACCU-CHEK FASTCLIX LANCETS MISC: 11 refills | Status: DC

## 2016-10-04 MED ORDER — METFORMIN HCL 1000 MG PO TABS: 1000.0000 mg | ORAL_TABLET | Freq: Two times a day (BID) | ORAL | 3 refills | Status: DC

## 2016-10-04 MED ORDER — ALLOPURINOL 100 MG PO TABS: 100.0000 mg | ORAL_TABLET | Freq: Every day | ORAL | 2 refills | Status: DC

## 2016-10-04 MED ORDER — FLUOXETINE HCL 40 MG PO CAPS: 40.0000 mg | ORAL_CAPSULE | Freq: Every day | ORAL | 5 refills | Status: DC

## 2016-10-04 MED ORDER — POTASSIUM CHLORIDE 20 MEQ/15ML (10%) PO SOLN: ORAL | 0 refills | Status: DC

## 2016-10-04 MED ORDER — GLUCERNA SHAKE PO LIQD: 237.0000 mL | Freq: Three times a day (TID) | ORAL | 0 refills | Status: DC

## 2016-10-04 MED ORDER — GABAPENTIN 400 MG PO CAPS: ORAL_CAPSULE | ORAL | 5 refills | Status: DC

## 2016-10-04 MED ORDER — FERROUS SULFATE 325 (65 FE) MG PO TBEC: 325.0000 mg | DELAYED_RELEASE_TABLET | Freq: Every day | ORAL | 1 refills | Status: DC

## 2016-10-04 MED ORDER — CETIRIZINE HCL 10 MG PO TABS: 10.0000 mg | ORAL_TABLET | Freq: Every day | ORAL | 2 refills | Status: DC

## 2016-10-04 MED ORDER — VITAMIN D3 25 MCG (1000 UNIT) PO TABS: 1000.0000 [IU] | ORAL_TABLET | Freq: Every day | ORAL | 2 refills | Status: DC

## 2016-10-04 MED ORDER — INSULIN PEN NEEDLE 31G X 5 MM MISC: 1.0000 | Freq: Every day | 0 refills | Status: DC

## 2016-10-04 MED FILL — FLUoxetine HCL 40 MG CAPS: 40 | 30 days supply | Qty: 30 | Fill #0

## 2016-10-04 MED FILL — POTASSIUM CL 10% (20 MEQ/15: 20 MEQ/15ML | 30 days supply | Qty: 450 | Fill #0

## 2016-10-04 MED FILL — GABAPENTIN 400 MG CAPSULE: 400 | 30 days supply | Qty: 180 | Fill #0 | Status: TO

## 2016-10-04 MED FILL — OMEPRAZOLE 20 MG CAPSULE DR: 20 | 30 days supply | Qty: 30 | Fill #0 | Status: TO

## 2016-10-04 MED FILL — COLCHICINE 0.6 MG TABLET: 0.6 | 7 days supply | Qty: 14 | Fill #0 | Status: TO

## 2016-10-04 MED FILL — ATENOLOL 50 MG TABLET: 50 | 90 days supply | Qty: 90 | Fill #0

## 2016-10-04 MED FILL — traZODone HCL 50 MG TABS: 50 | 30 days supply | Qty: 30 | Fill #0

## 2016-10-04 NOTE — Telephone Encounter (Signed)
[facility] is closed, requesting all meds to be filled @ [facility].

## 2016-10-08 DIAGNOSIS — G933 Postviral fatigue syndrome: Secondary | ICD-10-CM | POA: Diagnosis not present

## 2016-10-08 NOTE — Telephone Encounter (Signed)
Glucerna written 5/10 x 90 cans was sent to pharm but patient obtains with the help of a Education officer, museum. I have received fax for you to sign (in your mailbox). I will fax it back to SW once filled. Thank you!

## 2016-10-08 NOTE — Telephone Encounter (Signed)
Informed patient meds were approved & sent to [facility]

## 2016-10-15 DIAGNOSIS — M109 Gout, unspecified: Secondary | ICD-10-CM | POA: Diagnosis not present

## 2016-10-15 DIAGNOSIS — R2681 Unsteadiness on feet: Secondary | ICD-10-CM | POA: Diagnosis not present

## 2016-10-15 DIAGNOSIS — M6281 Muscle weakness (generalized): Secondary | ICD-10-CM | POA: Diagnosis not present

## 2016-10-15 DIAGNOSIS — R739 Hyperglycemia, unspecified: Secondary | ICD-10-CM | POA: Diagnosis not present

## 2016-10-15 DIAGNOSIS — I10 Essential (primary) hypertension: Secondary | ICD-10-CM | POA: Diagnosis not present

## 2016-10-15 DIAGNOSIS — N289 Disorder of kidney and ureter, unspecified: Secondary | ICD-10-CM | POA: Diagnosis not present

## 2016-10-15 DIAGNOSIS — E1101 Type 2 diabetes mellitus with hyperosmolarity with coma: Secondary | ICD-10-CM | POA: Diagnosis not present

## 2016-10-18 DIAGNOSIS — E119 Type 2 diabetes mellitus without complications: Secondary | ICD-10-CM | POA: Diagnosis not present

## 2016-10-18 DIAGNOSIS — R739 Hyperglycemia, unspecified: Secondary | ICD-10-CM | POA: Diagnosis not present

## 2016-10-18 DIAGNOSIS — I10 Essential (primary) hypertension: Secondary | ICD-10-CM | POA: Diagnosis not present

## 2016-10-18 DIAGNOSIS — N289 Disorder of kidney and ureter, unspecified: Secondary | ICD-10-CM | POA: Diagnosis not present

## 2016-10-18 DIAGNOSIS — E1101 Type 2 diabetes mellitus with hyperosmolarity with coma: Secondary | ICD-10-CM | POA: Diagnosis not present

## 2016-10-18 DIAGNOSIS — M6281 Muscle weakness (generalized): Secondary | ICD-10-CM | POA: Diagnosis not present

## 2016-10-18 DIAGNOSIS — M109 Gout, unspecified: Secondary | ICD-10-CM | POA: Diagnosis not present

## 2016-10-18 DIAGNOSIS — R2681 Unsteadiness on feet: Secondary | ICD-10-CM | POA: Diagnosis not present

## 2016-10-24 ENCOUNTER — Encounter: Payer: Self-pay | Admitting: Internal Medicine

## 2016-10-24 ENCOUNTER — Ambulatory Visit (INDEPENDENT_AMBULATORY_CARE_PROVIDER_SITE_OTHER): Payer: Medicare Other | Admitting: Internal Medicine

## 2016-10-24 VITALS — BP 187/107 | HR 84 | Temp 98.5°F | Ht 63.0 in | Wt 201.4 lb

## 2016-10-24 DIAGNOSIS — E1122 Type 2 diabetes mellitus with diabetic chronic kidney disease: Secondary | ICD-10-CM

## 2016-10-24 DIAGNOSIS — S78112A Complete traumatic amputation at level between left hip and knee, initial encounter: Secondary | ICD-10-CM

## 2016-10-24 DIAGNOSIS — I10 Essential (primary) hypertension: Secondary | ICD-10-CM

## 2016-10-24 DIAGNOSIS — I129 Hypertensive chronic kidney disease with stage 1 through stage 4 chronic kidney disease, or unspecified chronic kidney disease: Secondary | ICD-10-CM | POA: Diagnosis not present

## 2016-10-24 DIAGNOSIS — E785 Hyperlipidemia, unspecified: Secondary | ICD-10-CM | POA: Diagnosis not present

## 2016-10-24 DIAGNOSIS — N182 Chronic kidney disease, stage 2 (mild): Secondary | ICD-10-CM

## 2016-10-24 DIAGNOSIS — Z8619 Personal history of other infectious and parasitic diseases: Secondary | ICD-10-CM

## 2016-10-24 DIAGNOSIS — F1410 Cocaine abuse, uncomplicated: Secondary | ICD-10-CM

## 2016-10-24 DIAGNOSIS — F17210 Nicotine dependence, cigarettes, uncomplicated: Secondary | ICD-10-CM | POA: Diagnosis not present

## 2016-10-24 DIAGNOSIS — M10071 Idiopathic gout, right ankle and foot: Secondary | ICD-10-CM | POA: Diagnosis not present

## 2016-10-24 DIAGNOSIS — Z89612 Acquired absence of left leg above knee: Secondary | ICD-10-CM | POA: Diagnosis not present

## 2016-10-24 DIAGNOSIS — IMO0002 Reserved for concepts with insufficient information to code with codable children: Secondary | ICD-10-CM

## 2016-10-24 DIAGNOSIS — Z79899 Other long term (current) drug therapy: Secondary | ICD-10-CM

## 2016-10-24 DIAGNOSIS — M6281 Muscle weakness (generalized): Secondary | ICD-10-CM | POA: Diagnosis not present

## 2016-10-24 DIAGNOSIS — M109 Gout, unspecified: Secondary | ICD-10-CM | POA: Diagnosis not present

## 2016-10-24 DIAGNOSIS — N289 Disorder of kidney and ureter, unspecified: Secondary | ICD-10-CM | POA: Diagnosis not present

## 2016-10-24 DIAGNOSIS — Z0000 Encounter for general adult medical examination without abnormal findings: Secondary | ICD-10-CM

## 2016-10-24 DIAGNOSIS — Z794 Long term (current) use of insulin: Secondary | ICD-10-CM

## 2016-10-24 DIAGNOSIS — E1101 Type 2 diabetes mellitus with hyperosmolarity with coma: Secondary | ICD-10-CM | POA: Diagnosis not present

## 2016-10-24 DIAGNOSIS — N189 Chronic kidney disease, unspecified: Secondary | ICD-10-CM

## 2016-10-24 DIAGNOSIS — E1165 Type 2 diabetes mellitus with hyperglycemia: Secondary | ICD-10-CM

## 2016-10-24 DIAGNOSIS — R2681 Unsteadiness on feet: Secondary | ICD-10-CM | POA: Diagnosis not present

## 2016-10-24 DIAGNOSIS — F1910 Other psychoactive substance abuse, uncomplicated: Secondary | ICD-10-CM

## 2016-10-24 DIAGNOSIS — R739 Hyperglycemia, unspecified: Secondary | ICD-10-CM | POA: Diagnosis not present

## 2016-10-24 LAB — GLUCOSE, CAPILLARY: GLUCOSE-CAPILLARY: 111 mg/dL — AB (ref 65–99)

## 2016-10-24 MED ORDER — LOSARTAN POTASSIUM 100 MG PO TABS: 100.0000 mg | ORAL_TABLET | Freq: Every day | ORAL | 1 refills | Status: DC

## 2016-10-24 MED ORDER — INSULIN GLARGINE 100 UNITS/ML SOLOSTAR PEN: 22.0000 [IU] | PEN_INJECTOR | Freq: Every day | SUBCUTANEOUS | 0 refills | Status: DC

## 2016-10-24 MED ORDER — CETIRIZINE HCL 10 MG PO TABS: 10.0000 mg | ORAL_TABLET | Freq: Every day | ORAL | 2 refills | Status: DC

## 2016-10-24 MED FILL — LOSARTAN POTASSIUM 100 MG T: 100 | 90 days supply | Qty: 90 | Fill #0 | Status: TO

## 2016-10-24 NOTE — Patient Instructions (Signed)
We will check your blood work today to see if your gout medicine needs adjustment. If it does, I will call you and let you know.   Please make an appointment in the acute clinic in about one month to recheck on your blood pressure.  You are due for a pap smear; we have a free pap smear clinic in June - just call the number on the flier to claim your spot.  I will have [name] call you next week about your meter.

## 2016-10-24 NOTE — Progress Notes (Signed)
CC: hypertension  HPI:  [name] is a 66 y.o. with a PMH of T2DM, HTN, HLD, treated hep C, iron deficiency anemia 2/2 AVMs, s/p L AKA presenting to clinic for follow up on her T2DM, HTN, and her prosthesis.  HTN: Patient states that she has been taking her atenolol daily but since being asked to increase her losartan to 100mg  daily she has run out of it early and was unable to get it filled though script was sent to pharmacy. She denies headaches, vision or hearing changes, chest pain, shortness of breath, focal weakness, or sensory change.   T2DM: Patient has been compliant with lantus 22 units qhs and metformin 1000mg  BID. She denies nausea, vomiting, diarrhea, dizziness, weakness, diaphoresis, polyuria or polydipsia. She denies hypoglycemic episodes. She has not been checking her glucose as her meter has stopped working a couple of weeks ago; she has taken it to the pharmacy who have not been able to fix it either.   Gout: Patient was started on allopurinol in April for gout prophylaxis as she had multiple gout episodes in the last year. She has been tolerating therapy well and has had no further flares. Uric acid on beginning therapy was 8.6. She continues to smoke, though as cut down to 3-4 cigarettes a day; she continues to drink alcohol.  Please see problem based Assessment and Plan for status of patients chronic conditions.  Past Medical History:  Diagnosis Date  . Allergic rhinitis   . Anemia, iron deficiency 05/03/2011   recieves iron infusions as well as periodic red blood cell transfusions  . Anxiety   . Colon polyps   . Diabetes mellitus without complication ([place])   . Difficulty sleeping    takes trazadone for sleep  . Diverticulosis   . GERD (gastroesophageal reflux disease)   . GI bleeding    Has had bleeding from small bowel AVMs as well as from colon diverticulosis.  . Hepatitis C    Began Harvoni early 82016. completion date late 02/2015.   Marland Kitchen  Hypertension   . Hypokalemic alkalosis   . PCT (porphyria cutanea tarda) ([place])   . Peripheral vascular disease ([place])   . Stroke [facility]) 2010   no residual problems    Review of Systems:   Review of Systems  Constitutional: Negative for chills, diaphoresis, fever and malaise/fatigue.  HENT: Negative for hearing loss.   Eyes: Negative for blurred vision, double vision and photophobia.  Respiratory: Negative for cough, hemoptysis, sputum production and shortness of breath.   Cardiovascular: Negative for chest pain, palpitations and leg swelling.  Gastrointestinal: Negative for abdominal pain, constipation, diarrhea, heartburn, nausea and vomiting.  Genitourinary: Negative for dysuria, frequency and urgency.  Musculoskeletal: Negative for falls and myalgias.  Skin: Negative for rash.  Neurological: Negative for dizziness, tingling, sensory change, focal weakness, weakness and headaches.  Endo/Heme/Allergies: Negative for polydipsia.  Psychiatric/Behavioral: Positive for substance abuse (cocaine).    Physical Exam:  Vitals:   10/24/16 1433  BP: (!) 187/107  Pulse: 84  Temp: 98.5 F (36.9 C)  TempSrc: Oral  SpO2: 99%  Weight: 201 lb 6.4 oz (91.4 kg)  Height: 5\' 3"  (1.6 m)   Physical Exam  Constitutional: She is oriented to person, place, and time. She appears well-developed and well-nourished. No distress.  HENT:  Head: Normocephalic and atraumatic.  Eyes: Conjunctivae and EOM are normal. No scleral icterus.  Neck: Normal range of motion. Neck supple.  Cardiovascular: Normal rate, regular rhythm, normal heart sounds  and intact distal pulses.  Exam reveals no gallop and no friction rub.   No murmur heard. Pulmonary/Chest: Effort normal and breath sounds normal. She has no wheezes. She has no rales.  Abdominal: Soft. Bowel sounds are normal. She exhibits no distension and no mass. There is no tenderness. There is no guarding.  Musculoskeletal: She exhibits no edema.  S/p L AKA  with prosthesis in place.  Neurological: She is alert and oriented to person, place, and time. No cranial nerve deficit.  Skin: Skin is warm and dry. No rash noted. She is not diaphoretic. No erythema.  Psychiatric: She has a normal mood and affect. Her behavior is normal. Judgment and thought content normal.    Assessment & Plan:   See Encounters Tab for problem based charting.   Patient discussed with Dr. Angelia Mould   [name], MD Internal Medicine PGY1

## 2016-10-25 ENCOUNTER — Encounter: Payer: Self-pay | Admitting: Internal Medicine

## 2016-10-25 DIAGNOSIS — I700 Atherosclerosis of aorta: Secondary | ICD-10-CM | POA: Insufficient documentation

## 2016-10-25 LAB — URIC ACID: Uric Acid: 6.6 mg/dL (ref 2.5–7.1)

## 2016-10-25 MED ORDER — ALLOPURINOL 100 MG PO TABS: 200.0000 mg | ORAL_TABLET | Freq: Every day | ORAL | 2 refills | Status: DC

## 2016-10-25 MED ORDER — ATENOLOL 50 MG PO TABS: 50.0000 mg | ORAL_TABLET | Freq: Every morning | ORAL | 1 refills | Status: DC

## 2016-10-25 NOTE — Assessment & Plan Note (Signed)
Patient was started on allopurinol in April for gout prophylaxis as she had multiple gout episodes in the last year. She has been tolerating therapy well and has had no further flares. Uric acid on beginning therapy was 8.6. She continues to smoke, though as cut down to 3-4 cigarettes a day; she continues to drink alcohol.  Plan: --check uric acid - 6.6; will increase allopurinol to 200mg  daily and recheck level in 1 month for further titration

## 2016-10-25 NOTE — Assessment & Plan Note (Signed)
Patient has been compliant with lantus 22 units qhs and metformin 1000mg  BID. She denies nausea, vomiting, diarrhea, dizziness, weakness, diaphoresis, polyuria or polydipsia. She denies hypoglycemic episodes. She has not been checking her glucose as her meter has stopped working a couple of weeks ago; she has taken it to the pharmacy who have not been able to fix it either.   CBG in office is 111.  Plan: --f/u in 2 months for A1c check --will coordinate with diabetes educator on getting patient a new glucometer --continue lantus 22 units qhs - refill provided --continue metformin 1000mg  bid

## 2016-10-25 NOTE — Assessment & Plan Note (Signed)
>>  ASSESSMENT AND PLAN FOR TYPE 2 DIABETES MELLITUS WITH DIABETIC NEUROPATHY (HCC) WRITTEN ON 10/25/2016  3:53 PM BY LORETA LEVELS, MD  Patient has been compliant with lantus  22 units qhs and metformin  1000mg  BID. She denies nausea, vomiting, diarrhea, dizziness, weakness, diaphoresis, polyuria or polydipsia. She denies hypoglycemic episodes. She has not been checking her glucose as her meter has stopped working a couple of weeks ago; she has taken it to the pharmacy who have not been able to fix it either.   CBG in office is 111.  Plan: --f/u in 2 months for A1c check --will coordinate with diabetes educator on getting patient a new glucometer --continue lantus  22 units qhs - refill provided --continue metformin  1000mg  bid

## 2016-10-25 NOTE — Assessment & Plan Note (Signed)
Patient has been working with PT and OT to continue improving her mobility though she is still having trouble with the prosthetic knee joint locking up on her. She denies falls. PT/OT recommended a lift chair to help in stability and mobility.  Plan: --scripts for lift chair and prosthesis fitting provided

## 2016-10-25 NOTE — Assessment & Plan Note (Signed)
Continues to use cocaine; she wants to quit. She states that sometimes she gets down about her amputation and decreased mobility which is one factor leading her to use cocaine periodically. She acknowledges that she is doing better with her prosthesis and actions are being taken to address increasing her independence.

## 2016-10-25 NOTE — Assessment & Plan Note (Signed)
Uncontrolled as patient has been without losartan for at least 2 weeks. She has continued to take atenolol 50mg  daily. She denies chest pain, shortness of breath or neurological signs/symptoms.  Plan: --losartan 100mg  daily script to [facility] outpatient pharmacy --continue atenolol 50mg  daily --encouraged cessation of cocaine use --f/u in one month for BP check

## 2016-10-25 NOTE — Assessment & Plan Note (Signed)
Patient needs pap smear - had partial hysterectomy with cervix still intact. Denies vaginal discharge or bleeding.   Plan: --provided with free pap clinic information

## 2016-10-27 DIAGNOSIS — S78119A Complete traumatic amputation at level between unspecified hip and knee, initial encounter: Secondary | ICD-10-CM | POA: Diagnosis not present

## 2016-10-29 NOTE — Progress Notes (Signed)
Internal Medicine Clinic Attending  Case discussed with Dr. Svalina  at the time of the visit.  We reviewed the resident's history and exam and pertinent patient test results.  I agree with the assessment, diagnosis, and plan of care documented in the resident's note.  

## 2016-10-30 DIAGNOSIS — M6281 Muscle weakness (generalized): Secondary | ICD-10-CM | POA: Diagnosis not present

## 2016-10-30 DIAGNOSIS — M109 Gout, unspecified: Secondary | ICD-10-CM | POA: Diagnosis not present

## 2016-10-30 DIAGNOSIS — R739 Hyperglycemia, unspecified: Secondary | ICD-10-CM | POA: Diagnosis not present

## 2016-10-30 DIAGNOSIS — I10 Essential (primary) hypertension: Secondary | ICD-10-CM | POA: Diagnosis not present

## 2016-10-30 DIAGNOSIS — N289 Disorder of kidney and ureter, unspecified: Secondary | ICD-10-CM | POA: Diagnosis not present

## 2016-10-30 DIAGNOSIS — E1101 Type 2 diabetes mellitus with hyperosmolarity with coma: Secondary | ICD-10-CM | POA: Diagnosis not present

## 2016-10-30 DIAGNOSIS — R2681 Unsteadiness on feet: Secondary | ICD-10-CM | POA: Diagnosis not present

## 2016-11-01 DIAGNOSIS — M6281 Muscle weakness (generalized): Secondary | ICD-10-CM | POA: Diagnosis not present

## 2016-11-01 DIAGNOSIS — E1101 Type 2 diabetes mellitus with hyperosmolarity with coma: Secondary | ICD-10-CM | POA: Diagnosis not present

## 2016-11-01 DIAGNOSIS — M109 Gout, unspecified: Secondary | ICD-10-CM | POA: Diagnosis not present

## 2016-11-01 DIAGNOSIS — R739 Hyperglycemia, unspecified: Secondary | ICD-10-CM | POA: Diagnosis not present

## 2016-11-01 DIAGNOSIS — N289 Disorder of kidney and ureter, unspecified: Secondary | ICD-10-CM | POA: Diagnosis not present

## 2016-11-01 DIAGNOSIS — R2681 Unsteadiness on feet: Secondary | ICD-10-CM | POA: Diagnosis not present

## 2016-11-01 DIAGNOSIS — I10 Essential (primary) hypertension: Secondary | ICD-10-CM | POA: Diagnosis not present

## 2016-11-01 MED FILL — metFORMIN HCL 1000 MG TABS: 1000 | 30 days supply | Qty: 60 | Fill #1

## 2016-11-01 MED FILL — LANTUS SOLOSTAR 100 UNITS/M: 100 | 68 days supply | Qty: 15 | Fill #0

## 2016-11-02 MED FILL — OMEPRAZOLE DR 20 MG CAPSULE: 20 | 30 days supply | Qty: 30 | Fill #0

## 2016-11-02 MED FILL — COLCHICINE 0.6 MG TABLET: 0.6 | 7 days supply | Qty: 14 | Fill #0

## 2016-11-02 MED FILL — ALLOPURINOL 100 MG TABLET: 100 | 30 days supply | Qty: 60 | Fill #0

## 2016-11-02 MED FILL — GABAPENTIN 400 MG CAPSULE: 400 | 30 days supply | Qty: 180 | Fill #0

## 2016-11-06 DIAGNOSIS — R739 Hyperglycemia, unspecified: Secondary | ICD-10-CM | POA: Diagnosis not present

## 2016-11-06 DIAGNOSIS — N289 Disorder of kidney and ureter, unspecified: Secondary | ICD-10-CM | POA: Diagnosis not present

## 2016-11-06 DIAGNOSIS — E1101 Type 2 diabetes mellitus with hyperosmolarity with coma: Secondary | ICD-10-CM | POA: Diagnosis not present

## 2016-11-06 DIAGNOSIS — M109 Gout, unspecified: Secondary | ICD-10-CM | POA: Diagnosis not present

## 2016-11-06 DIAGNOSIS — I10 Essential (primary) hypertension: Secondary | ICD-10-CM | POA: Diagnosis not present

## 2016-11-06 DIAGNOSIS — M6281 Muscle weakness (generalized): Secondary | ICD-10-CM | POA: Diagnosis not present

## 2016-11-06 DIAGNOSIS — R2681 Unsteadiness on feet: Secondary | ICD-10-CM | POA: Diagnosis not present

## 2016-11-08 DIAGNOSIS — R739 Hyperglycemia, unspecified: Secondary | ICD-10-CM | POA: Diagnosis not present

## 2016-11-08 DIAGNOSIS — E1101 Type 2 diabetes mellitus with hyperosmolarity with coma: Secondary | ICD-10-CM | POA: Diagnosis not present

## 2016-11-08 DIAGNOSIS — M109 Gout, unspecified: Secondary | ICD-10-CM | POA: Diagnosis not present

## 2016-11-08 DIAGNOSIS — N289 Disorder of kidney and ureter, unspecified: Secondary | ICD-10-CM | POA: Diagnosis not present

## 2016-11-08 DIAGNOSIS — I10 Essential (primary) hypertension: Secondary | ICD-10-CM | POA: Diagnosis not present

## 2016-11-08 DIAGNOSIS — R2681 Unsteadiness on feet: Secondary | ICD-10-CM | POA: Diagnosis not present

## 2016-11-08 DIAGNOSIS — M6281 Muscle weakness (generalized): Secondary | ICD-10-CM | POA: Diagnosis not present

## 2016-11-13 DIAGNOSIS — E1101 Type 2 diabetes mellitus with hyperosmolarity with coma: Secondary | ICD-10-CM | POA: Diagnosis not present

## 2016-11-13 DIAGNOSIS — M6281 Muscle weakness (generalized): Secondary | ICD-10-CM | POA: Diagnosis not present

## 2016-11-13 DIAGNOSIS — M109 Gout, unspecified: Secondary | ICD-10-CM | POA: Diagnosis not present

## 2016-11-13 DIAGNOSIS — I10 Essential (primary) hypertension: Secondary | ICD-10-CM | POA: Diagnosis not present

## 2016-11-13 DIAGNOSIS — R739 Hyperglycemia, unspecified: Secondary | ICD-10-CM | POA: Diagnosis not present

## 2016-11-13 DIAGNOSIS — N289 Disorder of kidney and ureter, unspecified: Secondary | ICD-10-CM | POA: Diagnosis not present

## 2016-11-13 DIAGNOSIS — R2681 Unsteadiness on feet: Secondary | ICD-10-CM | POA: Diagnosis not present

## 2016-11-19 DIAGNOSIS — G933 Postviral fatigue syndrome: Secondary | ICD-10-CM | POA: Diagnosis not present

## 2016-11-20 MED FILL — COLCHICINE 0.6 MG TABLET: 0.6 | 7 days supply | Qty: 14 | Fill #1

## 2016-11-21 ENCOUNTER — Ambulatory Visit
Admission: RE | Admit: 2016-11-21 | Discharge: 2016-11-21 | Disposition: A | Discharge status: Home / Self Care | Payer: Medicare Other | Source: Ambulatory Visit | Attending: Family Medicine | Admitting: Family Medicine

## 2016-11-21 DIAGNOSIS — Z1231 Encounter for screening mammogram for malignant neoplasm of breast: Secondary | ICD-10-CM | POA: Diagnosis not present

## 2016-11-22 DIAGNOSIS — N289 Disorder of kidney and ureter, unspecified: Secondary | ICD-10-CM | POA: Diagnosis not present

## 2016-11-22 DIAGNOSIS — M6281 Muscle weakness (generalized): Secondary | ICD-10-CM | POA: Diagnosis not present

## 2016-11-22 DIAGNOSIS — I10 Essential (primary) hypertension: Secondary | ICD-10-CM | POA: Diagnosis not present

## 2016-11-22 DIAGNOSIS — M109 Gout, unspecified: Secondary | ICD-10-CM | POA: Diagnosis not present

## 2016-11-22 DIAGNOSIS — R2681 Unsteadiness on feet: Secondary | ICD-10-CM | POA: Diagnosis not present

## 2016-11-22 DIAGNOSIS — E1101 Type 2 diabetes mellitus with hyperosmolarity with coma: Secondary | ICD-10-CM | POA: Diagnosis not present

## 2016-11-22 DIAGNOSIS — R739 Hyperglycemia, unspecified: Secondary | ICD-10-CM | POA: Diagnosis not present

## 2016-11-23 ENCOUNTER — Ambulatory Visit (INDEPENDENT_AMBULATORY_CARE_PROVIDER_SITE_OTHER): Payer: Medicare Other | Admitting: Internal Medicine

## 2016-11-23 ENCOUNTER — Encounter: Payer: Self-pay | Admitting: Internal Medicine

## 2016-11-23 ENCOUNTER — Ambulatory Visit: Payer: Medicare Other | Admitting: Dietician

## 2016-11-23 ENCOUNTER — Telehealth: Payer: Self-pay | Admitting: Dietician

## 2016-11-23 VITALS — BP 170/91 | HR 79 | Temp 98.0°F | Wt 200.1 lb

## 2016-11-23 DIAGNOSIS — I10 Essential (primary) hypertension: Secondary | ICD-10-CM

## 2016-11-23 DIAGNOSIS — Z89612 Acquired absence of left leg above knee: Secondary | ICD-10-CM

## 2016-11-23 DIAGNOSIS — E1165 Type 2 diabetes mellitus with hyperglycemia: Secondary | ICD-10-CM

## 2016-11-23 DIAGNOSIS — F17210 Nicotine dependence, cigarettes, uncomplicated: Secondary | ICD-10-CM

## 2016-11-23 DIAGNOSIS — Z79899 Other long term (current) drug therapy: Secondary | ICD-10-CM | POA: Diagnosis not present

## 2016-11-23 DIAGNOSIS — Z794 Long term (current) use of insulin: Secondary | ICD-10-CM | POA: Diagnosis not present

## 2016-11-23 DIAGNOSIS — E1121 Type 2 diabetes mellitus with diabetic nephropathy: Secondary | ICD-10-CM

## 2016-11-23 DIAGNOSIS — IMO0001 Reserved for inherently not codable concepts without codable children: Secondary | ICD-10-CM

## 2016-11-23 MED ORDER — INSULIN GLARGINE 100 UNITS/ML SOLOSTAR PEN: 25.0000 [IU] | PEN_INJECTOR | Freq: Every day | SUBCUTANEOUS | 2 refills | Status: DC

## 2016-11-23 MED ORDER — AMLODIPINE BESYLATE 5 MG PO TABS: 5.0000 mg | ORAL_TABLET | Freq: Every day | ORAL | 0 refills | Status: DC

## 2016-11-23 MED ORDER — GLUCOSE BLOOD VI STRP: ORAL_STRIP | 12 refills | Status: DC

## 2016-11-23 MED ORDER — AMLODIPINE BESYLATE 5 MG PO TABS
5.0000 mg | ORAL_TABLET | Freq: Every day | ORAL | 0 refills | Status: DC
Start: 2016-11-23 — End: 2016-11-23

## 2016-11-23 MED FILL — ACCU-CHEK GUIDE TEST STRIP: 30 days supply | Qty: 100 | Fill #0

## 2016-11-23 MED FILL — AMLODIPINE BESYLATE 5 MG TA: 5 | 90 days supply | Qty: 90 | Fill #0

## 2016-11-23 NOTE — Assessment & Plan Note (Signed)
>>  ASSESSMENT AND PLAN FOR TYPE 2 DIABETES MELLITUS WITH DIABETIC NEUROPATHY (HCC) WRITTEN ON 11/27/2016  5:04 PM BY ALFORNIA MADISON, MD  Assessment A1c checked 09/25/2016 was 8.3. Current medication regimen includes Lantus  22 units at bedtime and metformin  1000 mg twice daily. Patient denies having any hypoglycemic symptoms. Review of meter shows there are only 7 readings: average 150, highest 216, and lowest 120. It is noted that the patient's A1c was 5.5 in October 2017 and then went up to greater than 15.5 in March 2018. This drastic change in her A1c in such a short period of time brings about the concern for possible LADA. Chronic pancreatitis less likely as CT of abdomen done in August 2017 in May 2018 showing normal appearance of pancreas.  Plan -Increase dose of Lantus  to 25 units at bedtime -Continue metformin  1000 mg twice daily -Check GAD-65 and ICA antibodies -Return to the clinic in 4 weeks with meter  Addendum 11/27/2016: Testing for LADA negative. Spoke to the patient over the phone. Continue current mgmt.

## 2016-11-23 NOTE — Progress Notes (Signed)
Assisted patient with meter. Her batteries were dead. New Accu Chek Guide meter provided. Request prescription for strips.

## 2016-11-23 NOTE — Assessment & Plan Note (Addendum)
Assessment A1c checked 09/25/2016 was 8.3. Current medication regimen includes Lantus 22 units at bedtime and metformin 1000 mg twice daily. Patient denies having any hypoglycemic symptoms. Review of meter shows there are only 7 readings: average 150, highest 216, and lowest 120. It is noted that the patient's A1c was 5.5 in October 2017 and then went up to greater than 15.5 in March 2018. This drastic change in her A1c in such a short period of time brings about the concern for possible LADA. Chronic pancreatitis less likely as CT of abdomen done in August 2017 in May 2018 showing normal appearance of pancreas.  Plan -Increase dose of Lantus to 25 units at bedtime -Continue metformin 1000 mg twice daily -Check GAD-65 and ICA antibodies -Return to the clinic in 4 weeks with meter  Addendum 11/27/2016: Testing for LADA negative. Spoke to the patient over the phone. Continue current mgmt.

## 2016-11-23 NOTE — Addendum Note (Signed)
Addended by: Shela Leff on: 11/23/2016 03:18 PM   Modules accepted: Orders

## 2016-11-23 NOTE — Assessment & Plan Note (Signed)
Assessment Current medication regimen includes losartan 100 mg daily and atenolol 50 mg daily. Patient reports compliance with her medications. Initial blood pressure 163/106 and repeat 170/91 at this visit.  Plan -Start amlodipine 5 mg daily -Continue losartan and atenolol as above -Return to the clinic in 4 weeks

## 2016-11-23 NOTE — Telephone Encounter (Signed)
Noted in last office visit that [atient needs help with her meter. Called her and she will bring it with her to today's appointment and be sure to see me.

## 2016-11-23 NOTE — Patient Instructions (Addendum)
Ms. Benbrook it was nice meeting you today.  -Start taking amlodipine 5 mg daily  -Continue taking losartan and atenolol as before  -Dose of Lantus has been increased: Use 25 units daily  -Continue using metformin as before  -Return to the clinic in 1 month with your meter

## 2016-11-23 NOTE — Progress Notes (Signed)
   CC: Patient is here for a follow-up of hypertension and diabetes.  HPI:  [name] is a 66 y.o. female with a past medical history of conditions listed below presenting to the clinic for a follow-up of hypertension and diabetes. Please see problem based charting for the status of the patient's current and chronic medical conditions.   Past Medical History:  Diagnosis Date  . Allergic rhinitis   . Anemia, iron deficiency 05/03/2011   recieves iron infusions as well as periodic red blood cell transfusions  . Anxiety   . Colon polyps   . Diabetes mellitus without complication ([place])   . Difficulty sleeping    takes trazadone for sleep  . Diverticulosis   . GERD (gastroesophageal reflux disease)   . GI bleeding    Has had bleeding from small bowel AVMs as well as from colon diverticulosis.  . Hepatitis C    Began Harvoni early 82016. completion date late 02/2015.   Marland Kitchen Hypertension   . Hypokalemic alkalosis   . PCT (porphyria cutanea tarda) ([place])   . Peripheral vascular disease ([place])   . Stroke [facility]) 2010   no residual problems   Review of Systems: Pertinent positives mentioned in HPI. Remainder of all ROS negative.   Physical Exam:  Vitals:   11/23/16 1351 11/23/16 1420  BP: (!) 163/106 (!) 170/91  Pulse: 81 79  Temp: 98 F (36.7 C)   TempSrc: Oral   SpO2: 99%   Weight: 200 lb 1.6 oz (90.8 kg)    Physical Exam  Constitutional: She is oriented to person, place, and time. She appears well-developed and well-nourished. No distress.  HENT:  Head: Normocephalic and atraumatic.  Eyes: Right eye exhibits no discharge. Left eye exhibits no discharge.  Cardiovascular: Normal rate, regular rhythm and intact distal pulses.   Pulmonary/Chest: Effort normal and breath sounds normal. No respiratory distress. She has no wheezes. She has no rales.  Abdominal: Soft. Bowel sounds are normal. She exhibits no distension. There is no tenderness.  Musculoskeletal: She exhibits no  edema.  Prosthetic left lower extremity  Neurological: She is alert and oriented to person, place, and time.  Skin: Skin is warm and dry.    Assessment & Plan:   See Encounters Tab for problem based charting.  Patient discussed with Dr. Daryll Drown

## 2016-11-26 ENCOUNTER — Telehealth: Payer: Self-pay | Admitting: *Deleted

## 2016-11-26 DIAGNOSIS — S78119A Complete traumatic amputation at level between unspecified hip and knee, initial encounter: Secondary | ICD-10-CM | POA: Diagnosis not present

## 2016-11-26 LAB — GLUTAMIC ACID DECARBOXYLASE AUTO ABS

## 2016-11-26 LAB — ANTI-ISLET CELL ANTIBODY: ISLET CELL AB: NEGATIVE

## 2016-11-26 NOTE — Progress Notes (Signed)
Internal Medicine Clinic Attending  Case discussed with Dr. Rathoreat the time of the visit. We reviewed the resident's history and exam and pertinent patient test results. I agree with the assessment, diagnosis, and plan of care documented in the resident's note.  

## 2016-11-26 NOTE — Telephone Encounter (Signed)
Caregiver calls and states pt's bp is 175/98 2 1/2 hrs after taking meds including new bp med

## 2016-11-29 ENCOUNTER — Other Ambulatory Visit (HOSPITAL_BASED_OUTPATIENT_CLINIC_OR_DEPARTMENT_OTHER): Payer: Medicare Other

## 2016-11-29 DIAGNOSIS — D500 Iron deficiency anemia secondary to blood loss (chronic): Secondary | ICD-10-CM | POA: Diagnosis not present

## 2016-11-29 LAB — CBC WITH DIFFERENTIAL/PLATELET
BASO%: 0.4 % (ref 0.0–2.0)
BASOS ABS: 0 10*3/uL (ref 0.0–0.1)
EOS ABS: 0.2 10*3/uL (ref 0.0–0.5)
EOS%: 2 % (ref 0.0–7.0)
HCT: 38.4 % (ref 34.8–46.6)
HEMOGLOBIN: 12.3 g/dL (ref 11.6–15.9)
LYMPH%: 32.5 % (ref 14.0–49.7)
MCH: 29.6 pg (ref 25.1–34.0)
MCHC: 32 g/dL (ref 31.5–36.0)
MCV: 92.5 fL (ref 79.5–101.0)
MONO#: 0.9 10*3/uL (ref 0.1–0.9)
MONO%: 11.4 % (ref 0.0–14.0)
NEUT#: 4.3 10*3/uL (ref 1.5–6.5)
NEUT%: 53.7 % (ref 38.4–76.8)
Platelets: 255 10*3/uL (ref 145–400)
RBC: 4.15 10*6/uL (ref 3.70–5.45)
RDW: 17.7 % — AB (ref 11.2–14.5)
WBC: 7.9 10*3/uL (ref 3.9–10.3)
lymph#: 2.6 10*3/uL (ref 0.9–3.3)

## 2016-11-29 LAB — FERRITIN: Ferritin: 29 ng/ml (ref 9–269)

## 2016-12-12 DIAGNOSIS — E119 Type 2 diabetes mellitus without complications: Secondary | ICD-10-CM | POA: Diagnosis not present

## 2016-12-19 ENCOUNTER — Ambulatory Visit (INDEPENDENT_AMBULATORY_CARE_PROVIDER_SITE_OTHER): Payer: Medicare Other | Admitting: Orthopedic Surgery

## 2016-12-19 ENCOUNTER — Other Ambulatory Visit: Payer: Self-pay | Admitting: Internal Medicine

## 2016-12-19 DIAGNOSIS — E1165 Type 2 diabetes mellitus with hyperglycemia: Principal | ICD-10-CM

## 2016-12-19 DIAGNOSIS — S78112A Complete traumatic amputation at level between left hip and knee, initial encounter: Secondary | ICD-10-CM

## 2016-12-19 DIAGNOSIS — Z794 Long term (current) use of insulin: Principal | ICD-10-CM

## 2016-12-19 DIAGNOSIS — Z89612 Acquired absence of left leg above knee: Secondary | ICD-10-CM | POA: Diagnosis not present

## 2016-12-19 DIAGNOSIS — IMO0001 Reserved for inherently not codable concepts without codable children: Secondary | ICD-10-CM

## 2016-12-19 MED FILL — UNIFINE PENTIPS 31GX3/16: 31G X 5 MM | 90 days supply | Qty: 100 | Fill #0

## 2016-12-19 MED FILL — UNIFINE PENTIPS 31GX3/16": 31G X 5 MM | 90 days supply | Qty: 100 | Fill #0

## 2016-12-19 NOTE — Telephone Encounter (Signed)
Call made to patient -lantus dose confirmed at 25 units at bedtime.  Rx on 11/23/2016 was not sent to pharmacy and set to print-rx phoned into the pharmacy along with pen needles.  No further action required, phone call complete.Regenia Skeeter, Kaylaann Mountz Cassady7/25/201811:02 AM

## 2016-12-19 NOTE — Telephone Encounter (Signed)
insulin glargine (LANTUS) 100 unit/mL SOPN,  Insulin Pen Needle 31G X 5 MM MISC, refill request @ [place] outpatient pharmacy. Per patient she is out for 3 days, needs this med by today. Please call pt back.

## 2016-12-19 NOTE — Progress Notes (Signed)
Office Visit Note   Patient: [name]           Date of Birth: May 22, 1951           MRN: [id number] Visit Date: 12/19/2016              Requested by: [name], MD [place], [place] [address] PCP: [name], MD  No chief complaint on file.     HPI: The patient is a 66 year old woman who presents today for evaluation of her left lower extremity. She is history of a left above-the-knee amputation about 9 years ago. States that the knee locks up on her often she's been unable to participate with physical therapy due to locking and catching of the prosthetic knee joint. The patient is in a motorized wheelchair for ambulation today.  Assessment & Plan: Visit Diagnoses:  1. Above knee amputation of left lower extremity ([place])     Plan: Have ordered him a socket replacement. The plan will be to get her into a suction liner. [facility]anger accompanied visit.  Follow-Up Instructions: Return if symptoms worsen or fail to improve.   Ortho Exam  Patient is alert, oriented, no adenopathy, well-dressed, normal affect, normal respiratory effort. The left residual limb is well healed well consolidated there is no open area or buildup of callus  Imaging: No results found.  Labs: Lab Results  Component Value Date   HGBA1C 8.3 09/25/2016   HGBA1C >15.5 (H) 08/18/2016   HGBA1C 5.5 03/08/2016   ESRSEDRATE 4 06/09/2010   ESRSEDRATE 6 05/08/2010   LABURIC 6.6 10/24/2016   LABURIC 8.6 (H) 08/19/2016   REPTSTATUS 08/23/2016 FINAL 08/17/2016   GRAMSTAIN  06/17/2014    NO WBC SEEN NO SQUAMOUS EPITHELIAL CELLS SEEN NO ORGANISMS SEEN Performed at [place]  08/17/2016    NO GROWTH 5 DAYS Performed at [facility], [place] [address], [place], [place] [address]    [place] 03/27/2012    Orders:  No orders of the defined types were placed in this encounter.  No orders of the defined types were placed in this encounter.    Procedures: No procedures performed  Clinical Data: No additional findings.  ROS:  All other systems negative, except as noted in the HPI. Review of Systems  Constitutional: Negative for chills and fever.  Musculoskeletal: Positive for gait problem.    Objective: Vital Signs: There were no vitals taken for this visit.  Specialty Comments:  No specialty comments available.  PMFS History: Patient Active Problem List   Diagnosis Date Noted  . Atherosclerosis of abdominal aorta ([place]) 10/25/2016  . Gout involving toe of right foot 02/28/2016  . Polysubstance abuse 03/05/2015  . Arteriovenous malformation of duodenum 02/25/2015  . Iron deficiency anemia due to chronic blood loss 02/23/2015  . Vitamin D deficiency 12/02/2014  . Preventative health care 11/30/2014  . History of porphyria 11/30/2014  . Depression 07/10/2013  . Type 2 diabetes mellitus ([place]) 06/07/2013  . History of hernia repair 08/18/2012  . Above knee amputation of left lower extremity ([place]) 08/18/2012  . Essential hypertension, benign 07/22/2008  . GERD 07/22/2008  . CARPAL TUNNEL RELEASE, HX OF 07/22/2008   Past Medical History:  Diagnosis Date  . Allergic rhinitis   . Anemia, iron deficiency 05/03/2011   recieves iron infusions as well as periodic red blood cell transfusions  . Anxiety   . Colon polyps   . Diabetes mellitus without  complication ([place])   . Difficulty sleeping    takes trazadone for sleep  . Diverticulosis   . GERD (gastroesophageal reflux disease)   . GI bleeding    Has had bleeding from small bowel AVMs as well as from colon diverticulosis.  . Hepatitis C    Began Harvoni early 82016. completion date late 02/2015.   Marland Kitchen Hypertension   . Hypokalemic alkalosis   . PCT (porphyria cutanea tarda) ([place])   . Peripheral vascular disease ([place])   . Stroke [facility]) 2010   no residual problems    Family History  Problem Relation Age of Onset  . Cancer Father        unknown  . Hypertension  Mother   . Diverticulitis Mother     Past Surgical History:  Procedure Laterality Date  . ABDOMINAL HYSTERECTOMY    . APPLICATION OF WOUND VAC N/A 06/21/2014   Procedure: APPLICATION OF WOUND VAC;  Surgeon: Coralie Keens, MD;  Location: [facility];  Service: General;  Laterality: N/A;  . CARPAL TUNNEL RELEASE     rt hand  . ENTEROSCOPY N/A 12/04/2012   Procedure: ENTEROSCOPY;  Surgeon: Beryle Beams, MD;  [facility];  Service: Endoscopy;  Laterality: N/A;  . ENTEROSCOPY N/A 12/18/2012   Procedure: ENTEROSCOPY;  Surgeon: Beryle Beams, MD;  [facility];  Service: Endoscopy;  Laterality: N/A;  . ENTEROSCOPY N/A 02/25/2015   Procedure: ENTEROSCOPY;  Surgeon: Inda Castle, MD;  Location: [facility] ENDOSCOPY;  Service: Endoscopy;  Laterality: N/A;  . ESOPHAGOGASTRODUODENOSCOPY  05/05/2011   Procedure: ESOPHAGOGASTRODUODENOSCOPY (EGD);  Surgeon: Zenovia Jarred, MD;  Location: [facility];  Service: Gastroenterology;  Laterality: N/A;  Dr. Hilarie Fredrickson will do procedure for Dr. Benson Norway Saturday.  . ESOPHAGOGASTRODUODENOSCOPY  05/08/2011   Procedure: ESOPHAGOGASTRODUODENOSCOPY (EGD);  Surgeon: Beryle Beams;  [facility];  Service: Endoscopy;  Laterality: N/A;  . ESOPHAGOGASTRODUODENOSCOPY  06/07/2011   Procedure: ESOPHAGOGASTRODUODENOSCOPY (EGD);  Surgeon: Beryle Beams, MD;  Location: [facility];  Service: Endoscopy;  Laterality: N/A;  . ESOPHAGOGASTRODUODENOSCOPY  12/20/2011   Procedure: ESOPHAGOGASTRODUODENOSCOPY (EGD);  Surgeon: Beryle Beams, MD;  Location: [facility];  Service: Endoscopy;  Laterality: N/A;  . EYE SURGERY Bilateral    cataracts  . FLEXIBLE SIGMOIDOSCOPY  12/21/2011   Procedure: FLEXIBLE SIGMOIDOSCOPY;  Surgeon: Beryle Beams, MD;  [facility];  Service: Endoscopy;  Laterality: N/A;  . HOT HEMOSTASIS  06/07/2011   Procedure: HOT HEMOSTASIS (ARGON PLASMA COAGULATION/BICAP);  Surgeon: Beryle Beams, MD;  Location: [facility];  Service:  Endoscopy;  Laterality: N/A;  . INCISIONAL HERNIA REPAIR N/A 06/01/2014   Procedure: ATTEMPTED LAPAROSCOPIC AND OPEN INCISIONAL HERNIA REPAIR WITH MESH;  Surgeon: Coralie Keens, MD;  Location: [facility];  Service: General;  Laterality: N/A;  . INSERTION OF MESH N/A 06/01/2014   Procedure: INSERTION OF MESH;  Surgeon: Coralie Keens, MD;  Location: [facility];  Service: General;  Laterality: N/A;  . LAPAROTOMY  02/16/2012   Procedure: EXPLORATORY LAPAROTOMY;  Surgeon: Edward Jolly, MD;  Location[facility];  Service: General;  Laterality: N/A;  oversewing of anastomotic leak and rigid sigmoidoscopy  . LAPAROTOMY N/A 06/21/2014   Procedure: ABDOMINAL WOUND EXPLORATION;  Surgeon: Coralie Keens, MD;  Location: [facility];  Service: General;  Laterality: N/A;  . LEG AMPUTATION ABOVE KNEE    . PARTIAL COLECTOMY  02/15/2012   Procedure: PARTIAL COLECTOMY;  Surgeon: Harl Bowie, MD;  [facility];  Service: General;  Laterality: N/A;  . TONSILLECTOMY  Social History   Occupational History  . Retired    Social History Main Topics  . Smoking status: Current Every Day Smoker    Packs/day: 0.20    Years: 17.00    Types: Cigarettes  . Smokeless tobacco: Never Used     Comment: wants to quit.  Smoking less 1 pk per week.  1-2 cigs per day  . Alcohol use 0.0 oz/week     Comment: Occasionally  . Drug use: Yes    Types: Marijuana     Comment: occasional marijuana, cocaine  . Sexual activity: Yes    Birth control/ protection: Post-menopausal

## 2016-12-21 ENCOUNTER — Other Ambulatory Visit: Payer: Self-pay | Admitting: Internal Medicine

## 2016-12-24 ENCOUNTER — Encounter: Payer: Self-pay | Admitting: Internal Medicine

## 2016-12-25 ENCOUNTER — Encounter: Payer: Self-pay | Admitting: Internal Medicine

## 2016-12-25 NOTE — Progress Notes (Signed)
CC: diabetes  HPI:  [name] is a 66 y.o. with a PMH of iron deficiency anemia 2/2 gastric AVMs, L AKA, HTN, HLD, T2DM and gout presenting to clinic for follow up on her diabetes and hypertension.   T2DM: Last A1c 09/25/2016 was 8.3. On Lantus 25 units qhs, metformin 1000mg  BID. Patient brings in her meter which shows AM glucoses from 120-150 and similar throughout the day; there were no hypoglycemic readings and patient denies experiencing hypoglycemic symptoms. She denies polyuria and polydipsia.   HTN: On losartan 100mg  daily, atenolol 50mg  daily, amlodipine 5mg  daily added last visit. Patient has not started the amlodipine yet due to not being sure if she was supposed to take it. She denies headaches, vision or hearing changes, focal weakness or numbness/tingling.  Gout:  Had allopurinol increased to 200mg  daily as uric acid at last visit still >6. She states compliance with it and denies gout flares since last visit.   Please see problem based Assessment and Plan for status of patients chronic conditions.  Past Medical History:  Diagnosis Date  . Allergic rhinitis   . Anemia, iron deficiency 05/03/2011   recieves iron infusions as well as periodic red blood cell transfusions  . Anxiety   . Arteriovenous malformation of duodenum 02/25/2015  . CARPAL TUNNEL RELEASE, HX OF 07/22/2008   Qualifier: Diagnosis of  By: Tomma Lightning MD, Claiborne Billings    . Colon polyps   . Diabetes mellitus without complication ([place])   . Difficulty sleeping    takes trazadone for sleep  . Diverticulosis   . GERD (gastroesophageal reflux disease)   . GI bleeding    Has had bleeding from small bowel AVMs as well as from colon diverticulosis.  . Hepatitis C    Began Harvoni early 82016. completion date late 02/2015.   Marland Kitchen History of hernia repair 08/18/2012  . History of porphyria 11/30/2014   Porphyria cutanea tarda in setting of chronic Hepatitis C, frequent  IV iron infusions, and alcohol abuse   .  Hypertension   . Hypokalemic alkalosis   . Peripheral vascular disease ([place])   . Stroke [facility]) 2010   no residual problems    Review of Systems:   Review of Systems  Constitutional: Negative for chills, fever and malaise/fatigue.  HENT: Negative for hearing loss.   Eyes: Negative for blurred vision and double vision.  Respiratory: Negative for cough, sputum production and shortness of breath.   Cardiovascular: Negative for chest pain and leg swelling.  Gastrointestinal: Negative for abdominal pain, blood in stool, constipation, diarrhea, melena, nausea and vomiting.  Genitourinary: Negative for frequency.  Musculoskeletal: Negative for falls.  Skin: Negative for rash.  Neurological: Negative for dizziness, sensory change, focal weakness, weakness and headaches.  Endo/Heme/Allergies: Negative for polydipsia.  Psychiatric/Behavioral: Positive for substance abuse (last used cocaine 11/30/16).    Physical Exam:  Vitals:   12/26/16 1529 12/26/16 1621  BP: (!) 146/70 138/74  Pulse: 82 78  Temp: 98.2 F (36.8 C)   TempSrc: Oral   SpO2: 100%   Weight: 200 lb 3.2 oz (90.8 kg)   Height: 5\' 3"  (1.6 m)    Physical Exam  Constitutional: She is oriented to person, place, and time. She appears well-developed and well-nourished. No distress.  HENT:  Head: Normocephalic and atraumatic.  Eyes: Conjunctivae and EOM are normal.  Neck: Normal range of motion. Neck supple.  Cardiovascular: Normal rate, regular rhythm, normal heart sounds and intact distal pulses.  Exam reveals no gallop and  no friction rub.   No murmur heard. Pulmonary/Chest: Effort normal and breath sounds normal. She has no wheezes. She has no rales.  Abdominal: Soft. Bowel sounds are normal. She exhibits no distension. There is no tenderness. There is no guarding.  Musculoskeletal: Normal range of motion. She exhibits no edema, tenderness or deformity.  S/p left AKA with prosthesis in place  Neurological: She is alert and  oriented to person, place, and time. No cranial nerve deficit.  Skin: Skin is warm and dry. Capillary refill takes less than 2 seconds. No rash noted. She is not diaphoretic. No erythema. No pallor.  Psychiatric: She has a normal mood and affect. Her behavior is normal. Judgment and thought content normal.    Assessment & Plan:   See Encounters Tab for problem based charting.   Patient discussed with Dr. Abelardo Diesel, MD Internal Medicine PGY2

## 2016-12-26 ENCOUNTER — Telehealth (INDEPENDENT_AMBULATORY_CARE_PROVIDER_SITE_OTHER): Payer: Self-pay | Admitting: Orthopedic Surgery

## 2016-12-26 ENCOUNTER — Encounter: Payer: Self-pay | Admitting: Internal Medicine

## 2016-12-26 ENCOUNTER — Ambulatory Visit (INDEPENDENT_AMBULATORY_CARE_PROVIDER_SITE_OTHER): Payer: Medicare Other | Admitting: Internal Medicine

## 2016-12-26 VITALS — BP 138/74 | HR 78 | Temp 98.2°F | Ht 63.0 in | Wt 200.2 lb

## 2016-12-26 DIAGNOSIS — E119 Type 2 diabetes mellitus without complications: Secondary | ICD-10-CM

## 2016-12-26 DIAGNOSIS — M109 Gout, unspecified: Secondary | ICD-10-CM | POA: Diagnosis not present

## 2016-12-26 DIAGNOSIS — E118 Type 2 diabetes mellitus with unspecified complications: Secondary | ICD-10-CM

## 2016-12-26 DIAGNOSIS — Z794 Long term (current) use of insulin: Secondary | ICD-10-CM | POA: Diagnosis not present

## 2016-12-26 DIAGNOSIS — F17210 Nicotine dependence, cigarettes, uncomplicated: Secondary | ICD-10-CM

## 2016-12-26 DIAGNOSIS — I10 Essential (primary) hypertension: Secondary | ICD-10-CM

## 2016-12-26 DIAGNOSIS — F1910 Other psychoactive substance abuse, uncomplicated: Secondary | ICD-10-CM

## 2016-12-26 DIAGNOSIS — Z79899 Other long term (current) drug therapy: Secondary | ICD-10-CM

## 2016-12-26 DIAGNOSIS — Z8249 Family history of ischemic heart disease and other diseases of the circulatory system: Secondary | ICD-10-CM | POA: Diagnosis not present

## 2016-12-26 DIAGNOSIS — Z8619 Personal history of other infectious and parasitic diseases: Secondary | ICD-10-CM

## 2016-12-26 LAB — GLUCOSE, CAPILLARY: Glucose-Capillary: 131 mg/dL — ABNORMAL HIGH (ref 65–99)

## 2016-12-26 LAB — POCT GLYCOSYLATED HEMOGLOBIN (HGB A1C): Hemoglobin A1C: 6.1

## 2016-12-26 MED ORDER — METFORMIN HCL 1000 MG PO TABS: 1000.0000 mg | ORAL_TABLET | Freq: Two times a day (BID) | ORAL | 3 refills | Status: DC

## 2016-12-26 MED FILL — metFORMIN HCL 1000 MG TABS: 1000 | 90 days supply | Qty: 180 | Fill #0

## 2016-12-26 MED FILL — LANTUS SOLOSTAR 100 UNITS/M: 100 | 60 days supply | Qty: 15 | Fill #0

## 2016-12-26 NOTE — Patient Instructions (Addendum)
Your A1c is 6.1! Good job!  Please start taking amlodipine 5mg  daily.  Please keep a lookout of low blood sugars - if you are feeling dizzy, sweaty, clammy, nauseated - please check your sugar, if <70 drink some apple or orange juice and recheck in 15 mins; repeat if still below 70. If you have these episodes recurrently, please give the clinic a call and we will work of decreasing your lantus.   I will call you if we need to make any changes to your allopurinol.   I will see you in about 1 month; sooner if you need [place].

## 2016-12-26 NOTE — Telephone Encounter (Signed)
12/19/2016 OV note faxed to [facility]

## 2016-12-27 ENCOUNTER — Encounter: Payer: Self-pay | Admitting: Internal Medicine

## 2016-12-27 DIAGNOSIS — S78119A Complete traumatic amputation at level between unspecified hip and knee, initial encounter: Secondary | ICD-10-CM | POA: Diagnosis not present

## 2016-12-27 DIAGNOSIS — G933 Postviral fatigue syndrome: Secondary | ICD-10-CM | POA: Diagnosis not present

## 2016-12-27 LAB — URIC ACID: Uric Acid: 10.7 mg/dL — ABNORMAL HIGH (ref 2.5–7.1)

## 2016-12-27 MED ORDER — ALLOPURINOL 100 MG PO TABS
200.0000 mg | ORAL_TABLET | Freq: Every day | ORAL | 2 refills | Status: DC
Start: 2016-12-27 — End: 2017-02-06

## 2016-12-27 MED ORDER — ALLOPURINOL 300 MG PO TABS: 300.0000 mg | ORAL_TABLET | Freq: Every day | ORAL | 3 refills | Status: DC

## 2016-12-27 MED ORDER — LOSARTAN POTASSIUM 100 MG PO TABS: 100.0000 mg | ORAL_TABLET | Freq: Every day | ORAL | 3 refills | Status: DC

## 2016-12-27 MED ORDER — COLCHICINE 0.6 MG PO TABS: 0.6000 mg | ORAL_TABLET | Freq: Two times a day (BID) | ORAL | 2 refills | Status: DC

## 2016-12-27 MED FILL — ALLOPURINOL 100 MG TABLET: 100 | 30 days supply | Qty: 60 | Fill #0

## 2016-12-27 NOTE — Assessment & Plan Note (Signed)
>>  ASSESSMENT AND PLAN FOR TYPE 2 DIABETES MELLITUS WITH DIABETIC NEUROPATHY (HCC) WRITTEN ON 12/27/2016  1:58 PM BY LORETA LEVELS, MD  Last A1c 09/25/2016 was 8.3. On Lantus  25 units qhs, metformin  1000mg  BID. Patient brings in her meter which shows AM glucoses from 120-150 and similar throughout the day; there were no hypoglycemic readings and patient denies experiencing hypoglycemic symptoms. She denies polyuria and polydipsia.  Plan: --check A1c - 6.1 --continue current regimen --advised on possibility of hypoglycemic episodes, symptoms and how to treat. She was advised to call the clinic if starting to occur so her Lantus  dose can be adjusted.

## 2016-12-27 NOTE — Assessment & Plan Note (Addendum)
Had allopurinol increased to 200mg  daily as uric acid at last visit still >6. She states compliance with it and denies gout flares since last visit.   On speaking with patient again over the phone about her lab results, she went through her medicine bottles and states that she does not have a bottle with allopurinol.   Plan: --check uric acid - elevated to 10.7 today  --restart allopurinol 200mg  daily and refill colchicine  --will f/u in one month --asked patient to bring in all medications to future visits

## 2016-12-27 NOTE — Assessment & Plan Note (Signed)
On losartan 100mg  daily, atenolol 50mg  daily, amlodipine 5mg  daily added last visit. Patient has not started the amlodipine yet due to not being sure if she was supposed to take it. She denies headaches, vision or hearing changes, focal weakness or numbness/tingling. Pt mildly hypertensive in office today.   She states the last time she used cocaine was early July.   Plan: --continue current regimen and advised patient to start taking her amlodipine --f/u in one month for BP check

## 2016-12-27 NOTE — Assessment & Plan Note (Addendum)
Last A1c 09/25/2016 was 8.3. On Lantus 25 units qhs, metformin 1000mg  BID. Patient brings in her meter which shows AM glucoses from 120-150 and similar throughout the day; there were no hypoglycemic readings and patient denies experiencing hypoglycemic symptoms. She denies polyuria and polydipsia.  Plan: --check A1c - 6.1 --continue current regimen --advised on possibility of hypoglycemic episodes, symptoms and how to treat. She was advised to call the clinic if starting to occur so her Lantus dose can be adjusted.

## 2016-12-27 NOTE — Assessment & Plan Note (Signed)
Ordered HCC screening RUQ U/S.

## 2016-12-27 NOTE — Assessment & Plan Note (Signed)
Patient states she has not used cocaine since early July and is very proud of that. I encouraged to continue abstaining from cocaine and other illicit drugs and to limit alcohol intake.

## 2016-12-28 NOTE — Progress Notes (Signed)
Internal Medicine Clinic Attending  Case discussed with Dr. Svalina  at the time of the visit.  We reviewed the resident's history and exam and pertinent patient test results.  I agree with the assessment, diagnosis, and plan of care documented in the resident's note.  

## 2017-01-04 ENCOUNTER — Ambulatory Visit (HOSPITAL_COMMUNITY): Payer: Medicare Other

## 2017-01-14 ENCOUNTER — Telehealth: Payer: Self-pay | Admitting: Hematology

## 2017-01-14 NOTE — Telephone Encounter (Signed)
Called patient but was unable to reach them. Mailed a copy of September appointment calendar.

## 2017-01-15 DIAGNOSIS — E119 Type 2 diabetes mellitus without complications: Secondary | ICD-10-CM | POA: Diagnosis not present

## 2017-01-15 DIAGNOSIS — Z961 Presence of intraocular lens: Secondary | ICD-10-CM | POA: Diagnosis not present

## 2017-01-15 DIAGNOSIS — H40013 Open angle with borderline findings, low risk, bilateral: Secondary | ICD-10-CM | POA: Diagnosis not present

## 2017-01-15 LAB — HM DIABETES EYE EXAM

## 2017-01-18 ENCOUNTER — Telehealth: Payer: Self-pay | Admitting: Hematology

## 2017-01-18 ENCOUNTER — Other Ambulatory Visit: Payer: Self-pay | Admitting: Internal Medicine

## 2017-01-18 NOTE — Telephone Encounter (Signed)
Patient called to verify appointment on 9/6.  She stated that in the past that she has shown up and was told that her appointment was the day before.  I confirmed that the appointment is 9/6 at 2:30

## 2017-01-21 ENCOUNTER — Encounter: Payer: Self-pay | Admitting: *Deleted

## 2017-01-24 DIAGNOSIS — Z89612 Acquired absence of left leg above knee: Secondary | ICD-10-CM | POA: Diagnosis not present

## 2017-01-27 DIAGNOSIS — S78119A Complete traumatic amputation at level between unspecified hip and knee, initial encounter: Secondary | ICD-10-CM | POA: Diagnosis not present

## 2017-01-29 ENCOUNTER — Ambulatory Visit (HOSPITAL_COMMUNITY): Payer: Medicare Other

## 2017-01-29 DIAGNOSIS — G933 Postviral fatigue syndrome: Secondary | ICD-10-CM | POA: Diagnosis not present

## 2017-01-30 ENCOUNTER — Other Ambulatory Visit: Payer: Medicare Other

## 2017-01-30 ENCOUNTER — Ambulatory Visit: Payer: Medicare Other | Admitting: Hematology

## 2017-01-30 NOTE — Progress Notes (Signed)
[facility] OFFICE PROGRESS NOTE   [name], MD [name] [address]  DIAGNOSIS: Iron deficiency anemia due to chronic blood loss  History of hepatitis C.   PROBLEM LIST:  1. Recurrent iron-deficiency anemia secondary to gastrointestinal bleeding (as noted above). Ms. Los also has required red cell transfusions in the past, most recently early December 2012. She underwent a small bowel enteroscopy by Dr. Dan Humphreys at [facility] on 08/13/2011. The enteroscope was passed up to 6 feet into the jejunum. He saw multiple AVMs, which were ablated. Two of the large AVMs bled during APC. These were in the 4th part of the duodenum very close to the ligament of Treitz. The remainder of the AVMs were in the proximal jejunum. In addition to the small bowel AV malformations, which were treated with APC, he saw some small esophageal varices which were not bleeding and also evidence of mild portal hypertensive gastropathy.  2. History of duodenal arteriovenous malformation.  3. History of hepatitis C infection.  4. Gastroesophageal reflux disease.  5. Depression.  6. Hypertension.  7. History of right Bell's palsy.  8. Status post left above-knee amputation secondary to necrotizing fasciitis 03/06/2009.  9. Neurodermatitis.  10. Previous history of alcohol usage.  11. Admission to the hospital from 12/20/2011 through 12/25/2011 for hematochezia felt to be secondary to a diverticular bleed, requiring 2 units of packed red cells.  12. Diverticulosis.  13. Elevated alpha fetoprotein, 72.3 on 11/19/2011 and 76.0 on 01/11/2012.  14. Abnormal MRI of the liver from 11/28/2011 showing diffuse and markedly low signal intensity throughout the liver as well as diffuse low signal intensity in the adrenal glands. 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 There were no findings for cirrhosis,  portal hypertension, splenomegaly or ascites, not were there any worrisome enhancing liver lesions.  15. Systolic ejection murmur.   CHIEF COMPLAIN: Follow up anemia  PRIOR TREATMENT: The patient has been heavily dependent upon IV Feraheme. Over the past year or so she has been receiving about 1 Feraheme infusion monthly on average. The patient's most recent infusions of IV Feraheme 510 mg occurred on the following dates: 06/05/2011, 07/03/2011, 07/31/2011, 08/17/2011, 08/28/2011. The patient received IV Feraheme 1020 mg on 10/02/2011 01/30/2012, 06/06/2012 and 09/10/2012, 05/2013, 07/2013, 08/2013, 09/2013, 11/12/2013, 01/15/2014 and 03/16/2014, 09/16/2014 and 09/23/2014, 11/2014 , 02/24/2015,  04/07/2015, 08/03/2015, 10/03/2015, 12/16/2015, 01/2016 and 02/27/2016  CURRENT TREATMENT: As noted above. Feraheme prn, will be cautious for iron supplement due to PCT  INTERVAL HISTORY:   [name] 66 y.o. female with history of recurrent iron-deficiency anemia due to GI bleeding is here for follow-up. She notes that she is doing well overall. She states that this is her 3rd appointment today and her prior appointment was acquiring a new LLE prosthesis. She notes that she will have PT come out to aid with her new prosthesis. Pt reports increased fatigue and insomnia and notes that she has had to have IV iron in the past.   On review of systems, reports increased fatigue and insomnia. Denies rash. Denies CP, SOB, abdominal pain.   MEDICAL HISTORY: Past Medical History:  Diagnosis Date  . Allergic rhinitis   . Anemia, iron deficiency 05/03/2011   recieves iron infusions as well as periodic red blood cell transfusions  . Anxiety   . Arteriovenous malformation of duodenum 02/25/2015  . CARPAL TUNNEL RELEASE, HX OF 07/22/2008   Qualifier: Diagnosis  of  By: Tomma Lightning MD, Claiborne Billings    . Colon polyps   . Diabetes mellitus without complication ([place])   . Difficulty sleeping    takes trazadone for sleep  . Diverticulosis    . GERD (gastroesophageal reflux disease)   . GI bleeding    Has had bleeding from small bowel AVMs as well as from colon diverticulosis.  . Hepatitis C    Began Harvoni early 82016. completion date late 02/2015.   Marland Kitchen History of hernia repair 08/18/2012  . History of porphyria 11/30/2014   Porphyria cutanea tarda in setting of chronic Hepatitis C, frequent  IV iron infusions, and alcohol abuse   . Hypertension   . Hypokalemic alkalosis   . Peripheral vascular disease ([place])   . Stroke [facility]) 2010   no residual problems     ALLERGIES:  is allergic to ciprofloxacin; morphine and related; penicillins; codeine; lisinopril; and feraheme [ferumoxytol].  MEDICATIONS: has a current medication list which includes the following prescription(s): aspirin ec, atenolol, cetirizine, cholecalciferol, colchicine, fluoxetine, gabapentin, hydrochlorothiazide, losartan, omeprazole, potassium chloride, trazodone, vitamin c, ferrous sulfate, and tramadol.  SURGICAL HISTORY:  Past Surgical History:  Procedure Laterality Date  . ABDOMINAL HYSTERECTOMY    . APPLICATION OF WOUND VAC N/A 06/21/2014   Procedure: APPLICATION OF WOUND VAC;  Surgeon: Coralie Keens, MD;  Location: [facility];  Service: General;  Laterality: N/A;  . CARPAL TUNNEL RELEASE     rt hand  . ENTEROSCOPY N/A 12/04/2012   Procedure: ENTEROSCOPY;  Surgeon: Beryle Beams, MD;  Location: [facility];  Service: Endoscopy;  Laterality: N/A;  . ENTEROSCOPY N/A 12/18/2012   Procedure: ENTEROSCOPY;  Surgeon: Beryle Beams, MD;  Location: [facility];  Service: Endoscopy;  Laterality: N/A;  . ENTEROSCOPY N/A 02/25/2015   Procedure: ENTEROSCOPY;  Surgeon: Inda Castle, MD;  Location: [facility] END[facility]COPY;  Service: Endoscopy;  Laterality: N/A;  . ESOPHAGOGASTRODUODENOSCOPY  05/05/2011   Procedure: ESOPHAGOGASTRODUODENOSCOPY (EGD);  Surgeon: Zenovia Jarred, MD;  Location: [facility];  Service: Gastroenterology;  Laterality: N/A;  Dr. Hilarie Fredrickson will do procedure for  Dr. Benson Norway Saturday.  . ESOPHAGOGASTRODUODENOSCOPY  05/08/2011   Procedure: ESOPHAGOGASTRODUODENOSCOPY (EGD);  Surgeon: Beryle Beams;  Location: [facility];  Service: Endoscopy;  Laterality: N/A;  . ESOPHAGOGASTRODUODENOSCOPY  06/07/2011   Procedure: ESOPHAGOGASTRODUODENOSCOPY (EGD);  Surgeon: Beryle Beams, MD;  Location: [facility];  Service: Endoscopy;  Laterality: N/A;  . ESOPHAGOGASTRODUODENOSCOPY  12/20/2011   Procedure: ESOPHAGOGASTRODUODENOSCOPY (EGD);  Surgeon: Beryle Beams, MD;  Location: [facility];  Service: Endoscopy;  Laterality: N/A;  . EYE SURGERY Bilateral    cataracts  . FLEXIBLE SIGMOIDOSCOPY  12/21/2011   Procedure: FLEXIBLE SIGMOIDOSCOPY;  Surgeon: Beryle Beams, MD;  Location: [facility];  Service: Endoscopy;  Laterality: N/A;  . HOT HEMOSTASIS  06/07/2011   Procedure: HOT HEMOSTASIS (ARGON PLASMA COAGULATION/BICAP);  Surgeon: Beryle Beams, MD;  Location: [facility];  Service: Endoscopy;  Laterality: N/A;  . INCISIONAL HERNIA REPAIR N/A 06/01/2014   Procedure: ATTEMPTED LAPAROSCOPIC AND OPEN INCISIONAL HERNIA REPAIR WITH MESH;  Surgeon: Coralie Keens, MD;  Location: [facility];  Service: General;  Laterality: N/A;  . INSERTION OF MESH N/A 06/01/2014   Procedure: INSERTION OF MESH;  Surgeon: Coralie Keens, MD;  Location: [facility] [place];  Service: General;  Laterality: N/A;  . LAPAROTOMY  02/16/2012   Procedure: EXPLORATORY LAPAROTOMY;  Surgeon: Edward Jolly, MD;  Location: [facility];  Service: General;  Laterality: N/A;  oversewing of anastomotic leak and rigid sigmoidoscopy  .  LAPAROTOMY N/A 06/21/2014   Procedure: ABDOMINAL WOUND EXPLORATION;  Surgeon: Coralie Keens, MD;  Location: [facility];  Service: General;  Laterality: N/A;  . LEG AMPUTATION ABOVE KNEE    . PARTIAL COLECTOMY  02/15/2012   Procedure: PARTIAL COLECTOMY;  Surgeon: Harl Bowie, MD;  Location: [facility];  Service: General;  Laterality: N/A;  . TONSILLECTOMY     Allergies as of 01/31/2017       Reactions   Ciprofloxacin Hives, Swelling   Morphine And Related Other (See Comments)   Makes pt sad and paranoid   Penicillins Hives, Swelling   Has patient had a PCN reaction causing immediate rash, facial/tongue/throat swelling, SOB or lightheadedness with hypotension:YES Has patient had a PCN reaction causing severe rash involving mucus membranes or skin necrosis:UNSURE Has patient had a PCN reaction that required hospitalization:YES Has patient had a PCN reaction occurring within the last 10 years:No If all of the above answers are "NO", then may proceed with Cephalosporin use.   Codeine Hives, Swelling   Lisinopril Cough   New onset dry cough after starting lisinopril. Resolved upon switching to losartan.   Feraheme [ferumoxytol] Itching   Tolerated Feraheme 6/26 & 7/21 pre-medications prior to medication      Medication List       Accurate as of 01/31/17 11:59 PM. Always use your most recent med list.          ACCU-CHEK FASTCLIX LANCETS Misc Use to test blood glucose 3 times daily.   allopurinol 100 MG tablet Commonly known as:  ZYLOPRIM Take 2 tablets (200 mg total) by mouth daily.   amLODipine 5 MG tablet Commonly known as:  NORVASC Take 1 tablet (5 mg total) by mouth daily.   aspirin EC 81 MG tablet Take 1 tablet (81 mg total) by mouth daily.   atenolol 50 MG tablet Commonly known as:  TENORMIN Take 1 tablet (50 mg total) by mouth every morning.   cetirizine 10 MG tablet Commonly known as:  ZYRTEC TAKE 1 TABLET BY MOUTH DAILY   cholecalciferol 1000 units tablet Commonly known as:  VITAMIN D Take 1 tablet (1,000 Units total) by mouth daily.   colchicine 0.6 MG tablet Take 1 tablet (0.6 mg total) by mouth 2 (two) times daily.   feeding supplement (GLUCERNA SHAKE) Liqd Take 237 mLs by mouth 3 (three) times daily between meals.   ferrous sulfate 325 (65 FE) MG EC tablet Take 1 tablet (325 mg total) by mouth daily after breakfast.   FLUoxetine 40 MG  capsule Commonly known as:  PROZAC Take 1 capsule (40 mg total) by mouth daily.   gabapentin 400 MG capsule Commonly known as:  NEURONTIN TAKE 2 CAPSULES BY MOUTH THREE TIMES A DAY   glucose blood test strip Commonly known as:  ACCU-CHEK GUIDE Check blood sugar 3 times a day   insulin glargine 100 unit/mL Sopn Commonly known as:  LANTUS Inject 0.25 mLs (25 Units total) into the skin at bedtime.   Insulin Pen Needle 31G X 5 MM Misc 1 Syringe by Does not apply route at bedtime.   losartan 100 MG tablet Commonly known as:  COZAAR Take 1 tablet (100 mg total) by mouth daily.   metFORMIN 1000 MG tablet Commonly known as:  GLUCOPHAGE Take 1 tablet (1,000 mg total) by mouth 2 (two) times daily with a meal.   omeprazole 20 MG capsule Commonly known as:  PRILOSEC TAKE 1 CAPSULE BY MOUTH EVERY DAY **STOP NEXIUM**   potassium chloride  20 MEQ/15ML (10%) Soln TAKE 15ML BY MOUTH EVERY DAY   traZODone 50 MG tablet Commonly known as:  DESYREL Take 1 tablet (50 mg total) by mouth at bedtime.   vitamin C 500 MG tablet Commonly known as:  ASCORBIC ACID Take 500 mg by mouth every morning.      REVIEW OF SYSTEMS:   Constitutional: Denies fevers, chills or abnormal weight loss;  (+) severe fatigue (+) insomnia Eyes: Denies blurriness of vision Ears, nose, mouth, throat, and face: Denies mucositis or sore throat Respiratory: Denies cough, dyspnea or wheezes Cardiovascular: Denies palpitation, chest discomfort (+) right knee discomfort and swelling (+) Right foot swelling Gastrointestinal:  Denies nausea, heartburn or change in bowel habits, hernia is painful and bleeds sometime the overlying skin. Skin: (+) no changes in skin rash  Lymphatics: Denies new lymphadenopathy or easy bruising Neurological:Denies numbness, tingling or new weaknesses Behavioral/Psych: Mood is stable, no new changes  All other systems were reviewed with the patient and are negative.  PHYSICAL EXAMINATION:   ECOG PERFORMANCE STATUS: 2 BP (!) 143/79 (BP Location: Left Arm, Patient Position: Sitting) Comment: nurse aware  Pulse 79   Temp 98.7 F (37.1 C) (Oral)   Resp 18   Ht 5\' 3"  (1.6 m)   Wt 205 lb 4.8 oz (93.1 kg) Comment: patient has prosthesis@35 .0lb=170.5lb  SpO2 96%   BMI 36.37 kg/m  General appearance: alert, appears stated age, no distress and moderately obese  Skin: she has multiple skin pigmentations from previous rash on her arms and legs.  Head: Normocephalic, without obvious abnormality, atraumatic  Neck: no adenopathy, supple, symmetrical, trachea midline and thyroid not enlarged, symmetric, no tenderness/mass/nodules  Lymph nodes: Cervical adenopathy: None appreciated  Heart:regular rate and rhythm, S1, S2 normal and systolic murmur 2/6 Lung:chest clear, no wheezing, rales, normal symmetric air entry, Heart exam - S1, S2 normal, no murmur, no gallop, rate regular  Abdomen: soft, distended, normal bowel sounds, nontender EXT:No peripheral edema on right extremity; Left above the knee amputation. She is wearing a prothesis.   Labs:  CBC Latest Ref Rng & Units 01/31/2017 11/29/2016 10/01/2016  WBC 3.9 - 10.3 10e3/uL 8.3 7.9 7.8  Hemoglobin 11.6 - 15.9 g/dL 11.3(L) 12.3 11.0(L)  Hematocrit 34.8 - 46.6 % 33.9(L) 38.4 34.3(L)  Platelets 145 - 400 10e3/uL 286 255 321    CMP Latest Ref Rng & Units 01/31/2017 09/25/2016 08/20/2016  Glucose 70 - 140 mg/dl 104 132(H) 265(H)  BUN 7.0 - 26.0 mg/dL 14.0 14 14  Creatinine 0.6 - 1.1 mg/dL 0.9 0.75 0.96  Sodium 136 - 145 mEq/L 141 143 134(L)  Potassium 3.5 - 5.1 mEq/L 4.4 3.7 3.9  Chloride 96 - 106 mmol/L - 104 106  CO2 22 - 29 mEq/L 22 22 21(L)  Calcium 8.4 - 10.4 mg/dL 9.0 7.7(L) 8.2(L)  Total Protein 6.4 - 8.3 g/dL 7.1 - -  Total Bilirubin 0.20 - 1.20 mg/dL 0.26 - -  Alkaline Phos 40 - 150 U/L 71 - -  AST 5 - 34 U/L 25 - -  ALT 0 - 55 U/L 21 - -   Results for SANDAR, KRINKE (MRN [id number]) as of 10/01/2016 15:17  Ref. Range 07/02/2016  14:52 08/28/2016 13:51 10/01/2016 13:26  Ferritin Latest Ref Range: 9 - 269 ng/ml 24 11 42     RADIOGRAPHIC STUDIES: Mammogram Digital Screening Bilateral 09/21/15 IMPRESSION: No mammographic evidence of malignancy. A result letter of this screening mammogram will be mailed directly to the patient.  ASSESSMENT: [name]  Hearn 66 y.o. female with a history of Iron deficiency anemia due to chronic blood loss  History of hepatitis C   PLAN:  1. Iron deficiency anemia secondary to GI loss -History of multiple GI bleeding in 2016, required blood transfusion, hospitalization and IV Feraheme -she required blood transfusion periodically, much less lately  -She has been receiving IV Feraheme every 2 months on average lately -I previously recommended close follow-up, we'll check her CBC and ferritin every 4 weeks -Due to her PCT, we'll be cautious with IV iron. Her PCT has resolved lately due to her successfully treated hepatitis C, I'll be okay to be more liberal to replace his iron deficiency. -Labs reviewed, Hgb is 11.3 as of today 01/31/17 and Ferritin 21 as of today (01/31/17). Patient is quite fatigued, partially related to her mild anemia and iron deficiency -Pt will be given IV Feraheme today (01/31/17) and next week   2. Porphyria cutaneous tarda (PCT) -per pt, she had significant sun sensitivity which caused skin blisters on her arms and legs, she has multiple skin pigmentations from healed skin rash on her arms. -Both her urine and serum porphyria, especially uroporphyrin and coproporphyrin all elevated.  -This is likely secondary to her hepatitis C -She Has previously completed treatment for hepatitis C. Her PCT has resolved now   3. chronic hepatitis C infection -She previously tested positive for hepatitis C in 2009. She has previously completed treatment for hepatitis C in 2016 and repeated test was negative.  -liver function has previously improved  -She was previously seen by GI Dr.  Deatra Ina before, who has left the practice. She will set up an appointment with [facility]. She was previously given their clinic's information.   4. DM2. --Management per PCP's office. Off metformin now  -Glucose was previously 341. I previously encouraged her to restart her metformin. She has appointment to see her primary care physician later in the week.  -Was hospitalized in March for high glucose levels greater than 1000.   5. Left BKA -She has a new prosthesis, she is wheelchair bound most of time, I previously encouraged her to continue physical therapy  6. Smoking cessation -She is still actively smoking, not ready to quit   7. Right knee pain and leg swelling  -likely secondary to osteoarthritis. She will follow up with PCP  -I previously encouraged her to use compression stock for edema, she will think about it   Plan  -Lab reviewed, we'll proceed IV Feraheme today and in 2 weeks  -labs every 4 weeks -follow up in 4 months   All questions were answered. The patient knows to call the clinic with any problems, questions or concerns. We can certainly see the patient much sooner if necessary.  I spent 15 minutes counseling the patient face to face. The total time spent in the appointment was 20 minutes.  This document serves as a record of services personally performed by Truitt Merle, MD. It was created on her behalf by Steva Colder, a trained medical scribe. The creation of this record is based on the scribe's personal observations and the provider's statements to them. This document has been checked and approved by the attending provider.   Truitt Merle 01/31/2017

## 2017-01-31 ENCOUNTER — Ambulatory Visit (HOSPITAL_BASED_OUTPATIENT_CLINIC_OR_DEPARTMENT_OTHER): Payer: Medicare Other | Admitting: Hematology

## 2017-01-31 ENCOUNTER — Ambulatory Visit (HOSPITAL_BASED_OUTPATIENT_CLINIC_OR_DEPARTMENT_OTHER): Payer: Medicare Other

## 2017-01-31 ENCOUNTER — Other Ambulatory Visit (HOSPITAL_BASED_OUTPATIENT_CLINIC_OR_DEPARTMENT_OTHER): Payer: Medicare Other

## 2017-01-31 VITALS — BP 143/79 | HR 79 | Temp 98.7°F | Resp 18 | Ht 63.0 in | Wt 205.3 lb

## 2017-01-31 DIAGNOSIS — D500 Iron deficiency anemia secondary to blood loss (chronic): Secondary | ICD-10-CM

## 2017-01-31 DIAGNOSIS — G4700 Insomnia, unspecified: Secondary | ICD-10-CM | POA: Diagnosis not present

## 2017-01-31 DIAGNOSIS — R5383 Other fatigue: Secondary | ICD-10-CM

## 2017-01-31 DIAGNOSIS — Z8619 Personal history of other infectious and parasitic diseases: Secondary | ICD-10-CM | POA: Diagnosis not present

## 2017-01-31 DIAGNOSIS — Z720 Tobacco use: Secondary | ICD-10-CM

## 2017-01-31 DIAGNOSIS — E119 Type 2 diabetes mellitus without complications: Secondary | ICD-10-CM | POA: Diagnosis not present

## 2017-01-31 DIAGNOSIS — K922 Gastrointestinal hemorrhage, unspecified: Secondary | ICD-10-CM

## 2017-01-31 DIAGNOSIS — Z89512 Acquired absence of left leg below knee: Secondary | ICD-10-CM

## 2017-01-31 LAB — COMPREHENSIVE METABOLIC PANEL
ALBUMIN: 3.4 g/dL — AB (ref 3.5–5.0)
ALK PHOS: 71 U/L (ref 40–150)
ALT: 21 U/L (ref 0–55)
AST: 25 U/L (ref 5–34)
Anion Gap: 10 mEq/L (ref 3–11)
BILIRUBIN TOTAL: 0.26 mg/dL (ref 0.20–1.20)
BUN: 14 mg/dL (ref 7.0–26.0)
CO2: 22 meq/L (ref 22–29)
Calcium: 9 mg/dL (ref 8.4–10.4)
Chloride: 109 mEq/L (ref 98–109)
Creatinine: 0.9 mg/dL (ref 0.6–1.1)
EGFR: 76 mL/min/{1.73_m2} — AB (ref >90)
GLUCOSE: 104 mg/dL (ref 70–140)
Potassium: 4.4 mEq/L (ref 3.5–5.1)
SODIUM: 141 meq/L (ref 136–145)
TOTAL PROTEIN: 7.1 g/dL (ref 6.4–8.3)

## 2017-01-31 LAB — CBC WITH DIFFERENTIAL/PLATELET
BASO%: 0.5 % (ref 0.0–2.0)
BASOS ABS: 0 10*3/uL (ref 0.0–0.1)
EOS%: 2.2 % (ref 0.0–7.0)
Eosinophils Absolute: 0.2 10*3/uL (ref 0.0–0.5)
HEMATOCRIT: 33.9 % — AB (ref 34.8–46.6)
HGB: 11.3 g/dL — ABNORMAL LOW (ref 11.6–15.9)
LYMPH#: 1.8 10*3/uL (ref 0.9–3.3)
LYMPH%: 21.9 % (ref 14.0–49.7)
MCH: 32.4 pg (ref 25.1–34.0)
MCHC: 33.3 g/dL (ref 31.5–36.0)
MCV: 97.3 fL (ref 79.5–101.0)
MONO#: 0.9 10*3/uL (ref 0.1–0.9)
MONO%: 11.2 % (ref 0.0–14.0)
NEUT#: 5.3 10*3/uL (ref 1.5–6.5)
NEUT%: 64.2 % (ref 38.4–76.8)
PLATELETS: 286 10*3/uL (ref 145–400)
RBC: 3.48 10*6/uL — AB (ref 3.70–5.45)
RDW: 15.2 % — ABNORMAL HIGH (ref 11.2–14.5)
WBC: 8.3 10*3/uL (ref 3.9–10.3)

## 2017-01-31 LAB — FERRITIN: Ferritin: 21 ng/ml (ref 9–269)

## 2017-01-31 MED ORDER — FAMOTIDINE 20 MG PO TABS
20.0000 mg | ORAL_TABLET | Freq: Once | ORAL | Status: AC
  Administered 2017-01-31: 20 mg via ORAL

## 2017-01-31 MED ORDER — FAMOTIDINE 20 MG PO TABS
ORAL_TABLET | ORAL | Status: AC
  Filled 2017-01-31: qty 1

## 2017-01-31 MED ORDER — SODIUM CHLORIDE 0.9 % IV SOLN
510.0000 mg | Freq: Once | INTRAVENOUS | Status: AC
  Administered 2017-01-31: 510 mg via INTRAVENOUS
  Filled 2017-01-31: qty 17

## 2017-01-31 NOTE — Patient Instructions (Signed)

## 2017-02-02 ENCOUNTER — Encounter: Payer: Self-pay | Admitting: Hematology

## 2017-02-06 ENCOUNTER — Encounter: Payer: Medicare Other | Admitting: Internal Medicine

## 2017-02-06 ENCOUNTER — Ambulatory Visit (INDEPENDENT_AMBULATORY_CARE_PROVIDER_SITE_OTHER): Payer: Medicare Other | Admitting: Internal Medicine

## 2017-02-06 ENCOUNTER — Encounter: Payer: Self-pay | Admitting: Internal Medicine

## 2017-02-06 VITALS — BP 144/82 | HR 80 | Temp 98.2°F | Wt 207.1 lb

## 2017-02-06 DIAGNOSIS — F17210 Nicotine dependence, cigarettes, uncomplicated: Secondary | ICD-10-CM | POA: Diagnosis not present

## 2017-02-06 DIAGNOSIS — F1410 Cocaine abuse, uncomplicated: Secondary | ICD-10-CM

## 2017-02-06 DIAGNOSIS — B1920 Unspecified viral hepatitis C without hepatic coma: Secondary | ICD-10-CM

## 2017-02-06 DIAGNOSIS — R1032 Left lower quadrant pain: Secondary | ICD-10-CM

## 2017-02-06 DIAGNOSIS — F1990 Other psychoactive substance use, unspecified, uncomplicated: Secondary | ICD-10-CM

## 2017-02-06 DIAGNOSIS — E785 Hyperlipidemia, unspecified: Secondary | ICD-10-CM | POA: Diagnosis not present

## 2017-02-06 DIAGNOSIS — I10 Essential (primary) hypertension: Secondary | ICD-10-CM | POA: Diagnosis not present

## 2017-02-06 DIAGNOSIS — M21611 Bunion of right foot: Secondary | ICD-10-CM

## 2017-02-06 DIAGNOSIS — K219 Gastro-esophageal reflux disease without esophagitis: Secondary | ICD-10-CM | POA: Diagnosis not present

## 2017-02-06 DIAGNOSIS — M10071 Idiopathic gout, right ankle and foot: Secondary | ICD-10-CM | POA: Diagnosis not present

## 2017-02-06 DIAGNOSIS — E118 Type 2 diabetes mellitus with unspecified complications: Secondary | ICD-10-CM

## 2017-02-06 DIAGNOSIS — F329 Major depressive disorder, single episode, unspecified: Secondary | ICD-10-CM

## 2017-02-06 DIAGNOSIS — L84 Corns and callosities: Secondary | ICD-10-CM | POA: Diagnosis not present

## 2017-02-06 DIAGNOSIS — R1030 Lower abdominal pain, unspecified: Secondary | ICD-10-CM

## 2017-02-06 DIAGNOSIS — F339 Major depressive disorder, recurrent, unspecified: Secondary | ICD-10-CM

## 2017-02-06 DIAGNOSIS — Z89612 Acquired absence of left leg above knee: Secondary | ICD-10-CM

## 2017-02-06 DIAGNOSIS — E1165 Type 2 diabetes mellitus with hyperglycemia: Secondary | ICD-10-CM

## 2017-02-06 DIAGNOSIS — E119 Type 2 diabetes mellitus without complications: Secondary | ICD-10-CM | POA: Diagnosis not present

## 2017-02-06 DIAGNOSIS — S78112A Complete traumatic amputation at level between left hip and knee, initial encounter: Secondary | ICD-10-CM

## 2017-02-06 DIAGNOSIS — M109 Gout, unspecified: Secondary | ICD-10-CM

## 2017-02-06 DIAGNOSIS — K5521 Angiodysplasia of colon with hemorrhage: Secondary | ICD-10-CM

## 2017-02-06 DIAGNOSIS — R109 Unspecified abdominal pain: Secondary | ICD-10-CM | POA: Insufficient documentation

## 2017-02-06 DIAGNOSIS — M1A9XX0 Chronic gout, unspecified, without tophus (tophi): Secondary | ICD-10-CM

## 2017-02-06 DIAGNOSIS — Z79899 Other long term (current) drug therapy: Secondary | ICD-10-CM

## 2017-02-06 DIAGNOSIS — Z0000 Encounter for general adult medical examination without abnormal findings: Secondary | ICD-10-CM

## 2017-02-06 DIAGNOSIS — IMO0001 Reserved for inherently not codable concepts without codable children: Secondary | ICD-10-CM

## 2017-02-06 DIAGNOSIS — Z794 Long term (current) use of insulin: Secondary | ICD-10-CM

## 2017-02-06 DIAGNOSIS — Z9889 Other specified postprocedural states: Secondary | ICD-10-CM

## 2017-02-06 LAB — GLUCOSE, CAPILLARY: GLUCOSE-CAPILLARY: 90 mg/dL (ref 65–99)

## 2017-02-06 MED ORDER — COLCHICINE 0.6 MG PO TABS: 0.6000 mg | ORAL_TABLET | Freq: Every day | ORAL | 1 refills | Status: DC

## 2017-02-06 MED ORDER — DILTIAZEM HCL ER 180 MG PO CP24: 180.0000 mg | ORAL_CAPSULE | Freq: Every day | ORAL | 2 refills | Status: DC

## 2017-02-06 MED ORDER — GLUCOSE BLOOD VI STRP: ORAL_STRIP | 12 refills | Status: DC

## 2017-02-06 MED ORDER — INSULIN GLARGINE 100 UNIT/ML SOLOSTAR PEN: 25.0000 [IU] | PEN_INJECTOR | Freq: Every day | SUBCUTANEOUS | 2 refills | Status: DC

## 2017-02-06 MED ORDER — INSULIN GLARGINE 100 UNITS/ML SOLOSTAR PEN: 25.0000 [IU] | PEN_INJECTOR | Freq: Every day | SUBCUTANEOUS | 2 refills | Status: DC

## 2017-02-06 MED ORDER — ALLOPURINOL 300 MG PO TABS: 300.0000 mg | ORAL_TABLET | Freq: Every day | ORAL | 4 refills | Status: DC

## 2017-02-06 NOTE — Assessment & Plan Note (Addendum)
Patient states continued intermittent use of cocaine; last use about 4 days ago. She attributes her continued use to never completely coping with her leg amputation which depresses her mood.  Plan: --encouraged cocaine cessation --referral placed to [facility] for further management of MDD

## 2017-02-06 NOTE — Assessment & Plan Note (Signed)
Patient with recurrent gout flares, who was restarted on allopurinol 6 weeks ago. Patient has been taking colchicine as needed for suspected beginning of gout flares and denies a full flare.   Plan: --continue allopurinol 300mg  daily --instructed patient to start taking colchicine 0.6mg  daily as well for prophylaxis; will plan on continuing this for about 6 months if she maintains stable uric acid levels

## 2017-02-06 NOTE — Assessment & Plan Note (Signed)
Patient declines Pap smear today.

## 2017-02-06 NOTE — Assessment & Plan Note (Signed)
Patient brought in her meter; print-out of of CBG readings over last month which ranges from 68-203; patient had only one hypoglycemic episode.  Patient's foot exam reveals bunion, callous on her right foot. She is s/p L AKA.  Plan: --continue current regimen --Form for diabetic foot ware completed for patient; will have office note faxed with order.

## 2017-02-06 NOTE — Assessment & Plan Note (Signed)
>>  ASSESSMENT AND PLAN FOR TYPE 2 DIABETES MELLITUS WITH DIABETIC NEUROPATHY ([facility]) WRITTEN ON 02/06/2017  6:15 PM BY LORETA LEVELS, MD  Patient brought in her meter; print-out of of CBG readings over last month which ranges from 68-203; patient had only one hypoglycemic episode.  Patient's foot exam reveals bunion, callous on her right foot. She is s/p L AKA.  Plan: --continue current regimen --Form for diabetic foot ware completed for patient; will have office note faxed with order.

## 2017-02-06 NOTE — Progress Notes (Signed)
    CC: blood pressure, leg swelling  HPI:  [name] is a 66 y.o. with a PMH of T2DM, HTN, HLD, chronic GI bleeding 2/2 AVMs with recurrent IV iron infusions, Hep C in SVR, GERD, substance use disorder presenting to clinic for f/u on her hypertension and gout.  Please see problem based Assessment and Plan for status of patients chronic conditions.  Past Medical History:  Diagnosis Date  . Allergic rhinitis   . Anemia, iron deficiency 05/03/2011   recieves iron infusions as well as periodic red blood cell transfusions  . Anxiety   . Arteriovenous malformation of duodenum 02/25/2015  . CARPAL TUNNEL RELEASE, HX OF 07/22/2008   Qualifier: Diagnosis of  By: Tomma Lightning MD, Claiborne Billings    . Colon polyps   . Diabetes mellitus without complication ([place])   . Difficulty sleeping    takes trazadone for sleep  . Diverticulosis   . GERD (gastroesophageal reflux disease)   . GI bleeding    Has had bleeding from small bowel AVMs as well as from colon diverticulosis.  . Hepatitis C    Began Harvoni early 82016. completion date late 02/2015.   Marland Kitchen History of hernia repair 08/18/2012  . History of porphyria 11/30/2014   Porphyria cutanea tarda in setting of chronic Hepatitis C, frequent  IV iron infusions, and alcohol abuse   . Hypertension   . Hypokalemic alkalosis   . Peripheral vascular disease ([place])   . Stroke [facility]) 2010   no residual problems    Review of Systems:   Review of Systems  Constitutional: Negative for chills and fever.  HENT: Negative for hearing loss.   Eyes: Negative for blurred vision and double vision.  Respiratory: Negative for cough and shortness of breath.   Cardiovascular: Positive for leg swelling (with amlodipine). Negative for chest pain.  Gastrointestinal: Positive for abdominal pain. Negative for blood in stool, constipation, diarrhea, melena, nausea and vomiting.  Genitourinary: Negative for dysuria and hematuria.  Musculoskeletal: Negative for myalgias.    Neurological: Negative for dizziness, sensory change, focal weakness, weakness and headaches.  Psychiatric/Behavioral: Positive for depression and substance abuse (cocaine).    Physical Exam:  Vitals:   02/06/17 1505 02/06/17 1536  BP: (!) 155/80 (!) 144/82  Pulse: 80   Temp: 98.2 F (36.8 C)   TempSrc: Oral   SpO2: 100%   Weight: 207 lb 1.6 oz (93.9 kg)    GENERAL- alert, co-operative, appears as stated age, not in any distress. HEENT- Atraumatic, normocephalic, EOMI, oral mucosa appears moist CARDIAC- RRR, no murmurs, rubs or gallops. RESP- Moving equal volumes of air, and clear to auscultation bilaterally, no wheezes or crackles. ABDOMEN- Soft, nontender, bowel sounds present. NEURO- No Cr N abnormality. EXTREMITIES- pulse 2+ radial and symmetric, 2+ right DP; s/p left AKA with prosthesis in place SKIN- Warm, dry, no rash or lesion. PSYCH- Normal mood and affect, appropriate thought content and speech.  Assessment & Plan:   See Encounters Tab for problem based charting.   Patient seen with Dr. Nilsa Nutting, MD Internal Medicine PGY2

## 2017-02-06 NOTE — Assessment & Plan Note (Signed)
Patient has been having adjustments to her prosthesis for better fit and function due to having difficulty with previous version which left her pretty dependent on her wheel chair over the last year. She requires PT for rehabilitation and training in use of her new prosthesis.   Plan: --referral placed to [facility] per patient preference

## 2017-02-06 NOTE — Assessment & Plan Note (Signed)
During visit, patient had sudden change in mentation with complaints of lower abdominal pain. CBG was 90, BP rechecked and stable, she had decreased alertness and was slow to respond to questions and commands though was able to do these appropriately. Otherwise no focal neurological findings. Symptoms resolved following defecation; patient states that she had taken metformin on empty stomach shortly before her appointment today.  I advised her to take metformin with meals.

## 2017-02-06 NOTE — Assessment & Plan Note (Signed)
Patient has been on fluoxetine 40mg  daily since her left AKA in 2010. She states that she is satisfied with it; she does report occasional worsening of symptoms which she attributes to never adjusting to the drastic change in her life surrounding the amputation which was required due to necrotizing fasciitis from a small leg wound. She states that this is what causes her to use cocaine to increase her mood.   Patient declines change in her antidepressant at this time, however she is interested in pursuing counseling which I think will be of help to her.  Plan: --referral placed to [facility]

## 2017-02-06 NOTE — Assessment & Plan Note (Addendum)
Patient on losartan 100mg  daily, atenolol 50mg  daily and was started on amlodipine 5mg  daily at last visit for uncontrolled HTN. Patient states that she stopped taking it due to LE edema which was bothersome to her. She subsequently started taking left-over HCTZ for the last few days with improvement of the edema. HCTZ was previously stopped due to recurrent gout.   Patient denies chest pain, headache, vision or hearing changes, numbness/tingling, focal weakness. She did have some exertional dyspnea which improved after IV iron after f/u with hematology for her Fe-deficiency anemia 2/2 chronic GI bleed from AVMs.   Patient states that she continues to use cocaine occasionally, last use was 4 days ago.   BP remains elevated on recheck today.   Plan: --stop HCTZ due to risk of gout flare; patient wishes to decrease risk of future gout flares --stop atenolol due to continued use of cocaine --removed amlodipine from patient list, patient states she is not interested in restarting this medicine --start diltiazem xr 180mg  daily --continue losartan 100mg  daily --encouraged cessation of cocaine use --f/u in 4 weeks for BP check

## 2017-02-06 NOTE — Patient Instructions (Signed)
Stop taking atenolol Stop taking HCTZ Start taking diltiazem 180mg  daily (one pill) Continue taking losartan 100mg  daily   Continue taking allopurinol 300mg  daily, and colchicine 0.6 (one tab) daily

## 2017-02-07 NOTE — Addendum Note (Signed)
Addended by: [name] on: 02/07/2017 01:18 PM   Modules accepted: Orders

## 2017-02-07 NOTE — Progress Notes (Signed)
Internal Medicine Clinic Attending  I saw and evaluated the patient.  I personally confirmed the key portions of the history and exam documented by Dr. Svalina and I reviewed pertinent patient test results.  The assessment, diagnosis, and plan were formulated together and I agree with the documentation in the resident's note.  

## 2017-02-13 DIAGNOSIS — M109 Gout, unspecified: Secondary | ICD-10-CM | POA: Diagnosis not present

## 2017-02-13 DIAGNOSIS — I10 Essential (primary) hypertension: Secondary | ICD-10-CM | POA: Diagnosis not present

## 2017-02-13 DIAGNOSIS — M6281 Muscle weakness (generalized): Secondary | ICD-10-CM | POA: Diagnosis not present

## 2017-02-13 DIAGNOSIS — Z794 Long term (current) use of insulin: Secondary | ICD-10-CM | POA: Diagnosis not present

## 2017-02-13 DIAGNOSIS — E119 Type 2 diabetes mellitus without complications: Secondary | ICD-10-CM | POA: Diagnosis not present

## 2017-02-13 DIAGNOSIS — R2681 Unsteadiness on feet: Secondary | ICD-10-CM | POA: Diagnosis not present

## 2017-02-13 DIAGNOSIS — Z89612 Acquired absence of left leg above knee: Secondary | ICD-10-CM | POA: Diagnosis not present

## 2017-02-15 ENCOUNTER — Other Ambulatory Visit: Payer: Self-pay | Admitting: Internal Medicine

## 2017-02-15 DIAGNOSIS — M109 Gout, unspecified: Secondary | ICD-10-CM | POA: Diagnosis not present

## 2017-02-15 DIAGNOSIS — Z794 Long term (current) use of insulin: Secondary | ICD-10-CM | POA: Diagnosis not present

## 2017-02-15 DIAGNOSIS — I10 Essential (primary) hypertension: Secondary | ICD-10-CM | POA: Diagnosis not present

## 2017-02-15 DIAGNOSIS — Z89612 Acquired absence of left leg above knee: Secondary | ICD-10-CM | POA: Diagnosis not present

## 2017-02-15 DIAGNOSIS — M6281 Muscle weakness (generalized): Secondary | ICD-10-CM | POA: Diagnosis not present

## 2017-02-15 DIAGNOSIS — R2681 Unsteadiness on feet: Secondary | ICD-10-CM | POA: Diagnosis not present

## 2017-02-15 DIAGNOSIS — E119 Type 2 diabetes mellitus without complications: Secondary | ICD-10-CM | POA: Diagnosis not present

## 2017-02-18 MED ORDER — TRAZODONE HCL 50 MG PO TABS: 50.0000 mg | ORAL_TABLET | Freq: Every day | ORAL | 3 refills | Status: DC

## 2017-02-19 DIAGNOSIS — I10 Essential (primary) hypertension: Secondary | ICD-10-CM | POA: Diagnosis not present

## 2017-02-19 DIAGNOSIS — Z794 Long term (current) use of insulin: Secondary | ICD-10-CM | POA: Diagnosis not present

## 2017-02-19 DIAGNOSIS — Z89612 Acquired absence of left leg above knee: Secondary | ICD-10-CM | POA: Diagnosis not present

## 2017-02-19 DIAGNOSIS — M6281 Muscle weakness (generalized): Secondary | ICD-10-CM | POA: Diagnosis not present

## 2017-02-19 DIAGNOSIS — M109 Gout, unspecified: Secondary | ICD-10-CM | POA: Diagnosis not present

## 2017-02-19 DIAGNOSIS — E119 Type 2 diabetes mellitus without complications: Secondary | ICD-10-CM | POA: Diagnosis not present

## 2017-02-19 DIAGNOSIS — R2681 Unsteadiness on feet: Secondary | ICD-10-CM | POA: Diagnosis not present

## 2017-02-20 DIAGNOSIS — M109 Gout, unspecified: Secondary | ICD-10-CM | POA: Diagnosis not present

## 2017-02-20 DIAGNOSIS — R2681 Unsteadiness on feet: Secondary | ICD-10-CM | POA: Diagnosis not present

## 2017-02-20 DIAGNOSIS — I10 Essential (primary) hypertension: Secondary | ICD-10-CM | POA: Diagnosis not present

## 2017-02-20 DIAGNOSIS — Z794 Long term (current) use of insulin: Secondary | ICD-10-CM | POA: Diagnosis not present

## 2017-02-20 DIAGNOSIS — Z89612 Acquired absence of left leg above knee: Secondary | ICD-10-CM | POA: Diagnosis not present

## 2017-02-20 DIAGNOSIS — M6281 Muscle weakness (generalized): Secondary | ICD-10-CM | POA: Diagnosis not present

## 2017-02-20 DIAGNOSIS — E119 Type 2 diabetes mellitus without complications: Secondary | ICD-10-CM | POA: Diagnosis not present

## 2017-02-22 DIAGNOSIS — Z89612 Acquired absence of left leg above knee: Secondary | ICD-10-CM | POA: Diagnosis not present

## 2017-02-22 DIAGNOSIS — R2681 Unsteadiness on feet: Secondary | ICD-10-CM | POA: Diagnosis not present

## 2017-02-22 DIAGNOSIS — M6281 Muscle weakness (generalized): Secondary | ICD-10-CM | POA: Diagnosis not present

## 2017-02-22 DIAGNOSIS — M109 Gout, unspecified: Secondary | ICD-10-CM | POA: Diagnosis not present

## 2017-02-22 DIAGNOSIS — I10 Essential (primary) hypertension: Secondary | ICD-10-CM | POA: Diagnosis not present

## 2017-02-22 DIAGNOSIS — Z794 Long term (current) use of insulin: Secondary | ICD-10-CM | POA: Diagnosis not present

## 2017-02-22 DIAGNOSIS — E119 Type 2 diabetes mellitus without complications: Secondary | ICD-10-CM | POA: Diagnosis not present

## 2017-02-22 MED FILL — LANTUS SOLOSTAR 100 UNITS/M: 100 | 60 days supply | Qty: 15 | Fill #1

## 2017-02-26 DIAGNOSIS — G933 Postviral fatigue syndrome: Secondary | ICD-10-CM | POA: Diagnosis not present

## 2017-02-26 DIAGNOSIS — S78119A Complete traumatic amputation at level between unspecified hip and knee, initial encounter: Secondary | ICD-10-CM | POA: Diagnosis not present

## 2017-02-27 DIAGNOSIS — M109 Gout, unspecified: Secondary | ICD-10-CM | POA: Diagnosis not present

## 2017-02-27 DIAGNOSIS — M6281 Muscle weakness (generalized): Secondary | ICD-10-CM | POA: Diagnosis not present

## 2017-02-27 DIAGNOSIS — I10 Essential (primary) hypertension: Secondary | ICD-10-CM | POA: Diagnosis not present

## 2017-02-27 DIAGNOSIS — E119 Type 2 diabetes mellitus without complications: Secondary | ICD-10-CM | POA: Diagnosis not present

## 2017-02-27 DIAGNOSIS — Z794 Long term (current) use of insulin: Secondary | ICD-10-CM | POA: Diagnosis not present

## 2017-02-27 DIAGNOSIS — Z89612 Acquired absence of left leg above knee: Secondary | ICD-10-CM | POA: Diagnosis not present

## 2017-02-27 DIAGNOSIS — R2681 Unsteadiness on feet: Secondary | ICD-10-CM | POA: Diagnosis not present

## 2017-03-08 ENCOUNTER — Telehealth: Payer: Self-pay

## 2017-03-08 NOTE — Telephone Encounter (Signed)
Faxed [facility] form 03/08/2017 @ [phone].
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
## 2017-03-13 ENCOUNTER — Ambulatory Visit (HOSPITAL_COMMUNITY)
Admission: RE | Admit: 2017-03-13 | Discharge: 2017-03-13 | Disposition: A | Discharge status: Home / Self Care | Payer: Medicare Other | Source: Ambulatory Visit | Attending: Internal Medicine | Admitting: Internal Medicine

## 2017-03-13 ENCOUNTER — Ambulatory Visit (INDEPENDENT_AMBULATORY_CARE_PROVIDER_SITE_OTHER): Payer: Medicare Other | Admitting: Internal Medicine

## 2017-03-13 VITALS — BP 162/90 | HR 78 | Temp 98.3°F | Ht 63.0 in | Wt 203.9 lb

## 2017-03-13 DIAGNOSIS — Q2730 Arteriovenous malformation, site unspecified: Secondary | ICD-10-CM | POA: Diagnosis not present

## 2017-03-13 DIAGNOSIS — E119 Type 2 diabetes mellitus without complications: Secondary | ICD-10-CM

## 2017-03-13 DIAGNOSIS — Z8619 Personal history of other infectious and parasitic diseases: Secondary | ICD-10-CM

## 2017-03-13 DIAGNOSIS — M62838 Other muscle spasm: Secondary | ICD-10-CM | POA: Diagnosis not present

## 2017-03-13 DIAGNOSIS — G8911 Acute pain due to trauma: Secondary | ICD-10-CM

## 2017-03-13 DIAGNOSIS — F339 Major depressive disorder, recurrent, unspecified: Secondary | ICD-10-CM | POA: Diagnosis not present

## 2017-03-13 DIAGNOSIS — Z79899 Other long term (current) drug therapy: Secondary | ICD-10-CM

## 2017-03-13 DIAGNOSIS — M109 Gout, unspecified: Secondary | ICD-10-CM | POA: Diagnosis not present

## 2017-03-13 DIAGNOSIS — E785 Hyperlipidemia, unspecified: Secondary | ICD-10-CM

## 2017-03-13 DIAGNOSIS — Z23 Encounter for immunization: Secondary | ICD-10-CM

## 2017-03-13 DIAGNOSIS — I10 Essential (primary) hypertension: Secondary | ICD-10-CM | POA: Diagnosis not present

## 2017-03-13 DIAGNOSIS — Z888 Allergy status to other drugs, medicaments and biological substances status: Secondary | ICD-10-CM

## 2017-03-13 DIAGNOSIS — B1920 Unspecified viral hepatitis C without hepatic coma: Secondary | ICD-10-CM | POA: Diagnosis not present

## 2017-03-13 DIAGNOSIS — M25511 Pain in right shoulder: Secondary | ICD-10-CM | POA: Diagnosis not present

## 2017-03-13 DIAGNOSIS — K922 Gastrointestinal hemorrhage, unspecified: Secondary | ICD-10-CM | POA: Diagnosis not present

## 2017-03-13 DIAGNOSIS — F1490 Cocaine use, unspecified, uncomplicated: Secondary | ICD-10-CM

## 2017-03-13 DIAGNOSIS — F17210 Nicotine dependence, cigarettes, uncomplicated: Secondary | ICD-10-CM

## 2017-03-13 DIAGNOSIS — F1990 Other psychoactive substance use, unspecified, uncomplicated: Secondary | ICD-10-CM

## 2017-03-13 DIAGNOSIS — Z89612 Acquired absence of left leg above knee: Secondary | ICD-10-CM

## 2017-03-13 MED ORDER — CYCLOBENZAPRINE HCL 5 MG PO TABS: 5.0000 mg | ORAL_TABLET | Freq: Three times a day (TID) | ORAL | 0 refills | Status: DC | PRN

## 2017-03-13 MED FILL — CYCLOBENZAPRINE 5 MG TABLET: 5 | 10 days supply | Qty: 30 | Fill #0

## 2017-03-13 NOTE — Patient Instructions (Signed)
For your shoulder pain, please go upstairs to radiology for an xray. You can use flexeril 5mg  every 8 hours if needed which will help with the muscle spasm. Also use heat on the area multiple times a day with will help relax the muscle. I expect this to continue getting better.   Please come back in about 4 weeks to discuss your blood pressure further.

## 2017-03-14 ENCOUNTER — Encounter: Payer: Self-pay | Admitting: Internal Medicine

## 2017-03-14 NOTE — Assessment & Plan Note (Signed)
Patient still has not gotten screening U/S due to social stressors; I advised her to reschedule her imaging appointment while getting her shoulder Xray today.
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
## 2017-03-14 NOTE — Assessment & Plan Note (Signed)
Did not get a chance to directly address her cocaine use this visit, but patient did mention she was having phone calls regularly with counselors and is planning on attending a group meeting Oct 30th; I encouraged her to continue with counseling.

## 2017-03-14 NOTE — Assessment & Plan Note (Signed)
Uncontrolled, and complicated by cocaine use which I did not get a chance to address this visit. Patient stopped taking diltiazem due to LE swelling and possible tongue swelling.  Plan: --continue losartan 100mg  daily --unfortunately further medical management is limited with her comorbidities and decision to not take amlodipine due to LE swelling; will f/u in 4 weeks to discuss options - if patient agreeable may be able to restart HCTZ since gout has been controlled last couple of months.

## 2017-03-15 ENCOUNTER — Other Ambulatory Visit: Payer: Self-pay | Admitting: Internal Medicine

## 2017-03-15 DIAGNOSIS — I10 Essential (primary) hypertension: Secondary | ICD-10-CM

## 2017-03-15 NOTE — Progress Notes (Signed)
Internal Medicine Clinic Attending  Case discussed with Dr. Svalina  at the time of the visit.  We reviewed the resident's history and exam and pertinent patient test results.  I agree with the assessment, diagnosis, and plan of care documented in the resident's note.  

## 2017-03-18 NOTE — Telephone Encounter (Signed)
Received requests from [facility], losartan has refills from [facility]. Called patient to verify, she stated her meds will arrive this afternoon from [facility] & if losartan is not delivered, she will get it from [facility] next week. She would like to use [facility] for her meds. Has enough Prozac @ this time. She's requesting a nurse call back tomorrow to let nurse know if she received losartan from the mail. (will pend these refill requests)

## 2017-03-19 DIAGNOSIS — Z89612 Acquired absence of left leg above knee: Secondary | ICD-10-CM | POA: Diagnosis not present

## 2017-03-19 DIAGNOSIS — E119 Type 2 diabetes mellitus without complications: Secondary | ICD-10-CM | POA: Diagnosis not present

## 2017-03-19 DIAGNOSIS — R2681 Unsteadiness on feet: Secondary | ICD-10-CM | POA: Diagnosis not present

## 2017-03-19 DIAGNOSIS — M109 Gout, unspecified: Secondary | ICD-10-CM | POA: Diagnosis not present

## 2017-03-19 DIAGNOSIS — Z794 Long term (current) use of insulin: Secondary | ICD-10-CM | POA: Diagnosis not present

## 2017-03-19 DIAGNOSIS — I10 Essential (primary) hypertension: Secondary | ICD-10-CM | POA: Diagnosis not present

## 2017-03-19 DIAGNOSIS — M6281 Muscle weakness (generalized): Secondary | ICD-10-CM | POA: Diagnosis not present

## 2017-03-19 NOTE — Telephone Encounter (Signed)
Called pt - stated she received Losartan 100 mg today from [place].

## 2017-03-20 DIAGNOSIS — I10 Essential (primary) hypertension: Secondary | ICD-10-CM | POA: Diagnosis not present

## 2017-03-20 DIAGNOSIS — Z794 Long term (current) use of insulin: Secondary | ICD-10-CM | POA: Diagnosis not present

## 2017-03-20 DIAGNOSIS — M6281 Muscle weakness (generalized): Secondary | ICD-10-CM | POA: Diagnosis not present

## 2017-03-20 DIAGNOSIS — Z89612 Acquired absence of left leg above knee: Secondary | ICD-10-CM | POA: Diagnosis not present

## 2017-03-20 DIAGNOSIS — E119 Type 2 diabetes mellitus without complications: Secondary | ICD-10-CM | POA: Diagnosis not present

## 2017-03-20 DIAGNOSIS — R2681 Unsteadiness on feet: Secondary | ICD-10-CM | POA: Diagnosis not present

## 2017-03-20 DIAGNOSIS — M109 Gout, unspecified: Secondary | ICD-10-CM | POA: Diagnosis not present

## 2017-03-21 ENCOUNTER — Ambulatory Visit (HOSPITAL_COMMUNITY)
Admission: RE | Admit: 2017-03-21 | Discharge: 2017-03-21 | Disposition: A | Discharge status: Home / Self Care | Payer: Medicare Other | Source: Ambulatory Visit | Attending: Internal Medicine | Admitting: Internal Medicine

## 2017-03-21 DIAGNOSIS — K769 Liver disease, unspecified: Secondary | ICD-10-CM | POA: Insufficient documentation

## 2017-03-21 DIAGNOSIS — Z8619 Personal history of other infectious and parasitic diseases: Secondary | ICD-10-CM | POA: Diagnosis not present

## 2017-03-25 ENCOUNTER — Telehealth: Payer: Self-pay | Admitting: Internal Medicine

## 2017-03-25 ENCOUNTER — Other Ambulatory Visit: Payer: Self-pay | Admitting: Internal Medicine

## 2017-03-25 DIAGNOSIS — Z89612 Acquired absence of left leg above knee: Secondary | ICD-10-CM | POA: Diagnosis not present

## 2017-03-25 DIAGNOSIS — E1122 Type 2 diabetes mellitus with diabetic chronic kidney disease: Secondary | ICD-10-CM

## 2017-03-25 DIAGNOSIS — M109 Gout, unspecified: Secondary | ICD-10-CM | POA: Diagnosis not present

## 2017-03-25 DIAGNOSIS — R2681 Unsteadiness on feet: Secondary | ICD-10-CM | POA: Diagnosis not present

## 2017-03-25 DIAGNOSIS — IMO0002 Reserved for concepts with insufficient information to code with codable children: Secondary | ICD-10-CM

## 2017-03-25 DIAGNOSIS — N182 Chronic kidney disease, stage 2 (mild): Principal | ICD-10-CM

## 2017-03-25 DIAGNOSIS — E119 Type 2 diabetes mellitus without complications: Secondary | ICD-10-CM | POA: Diagnosis not present

## 2017-03-25 DIAGNOSIS — I10 Essential (primary) hypertension: Secondary | ICD-10-CM | POA: Diagnosis not present

## 2017-03-25 DIAGNOSIS — E1165 Type 2 diabetes mellitus with hyperglycemia: Secondary | ICD-10-CM

## 2017-03-25 DIAGNOSIS — M6281 Muscle weakness (generalized): Secondary | ICD-10-CM | POA: Diagnosis not present

## 2017-03-25 DIAGNOSIS — Z794 Long term (current) use of insulin: Secondary | ICD-10-CM | POA: Diagnosis not present

## 2017-03-25 MED FILL — UNIFINE PENTIPS 31GX3/16": 31G X 5 MM | 90 days supply | Qty: 100 | Fill #0

## 2017-03-25 MED FILL — UNIFINE PENTIPS 31GX3/16: 31G X 5 MM | 90 days supply | Qty: 100 | Fill #0

## 2017-03-25 NOTE — Telephone Encounter (Signed)
Please advise 

## 2017-03-25 NOTE — Telephone Encounter (Signed)
Pt was angry, raised her voice, stated "im not going to wait that damn long" she states she must use transportation and must schedule 3 days in advance. She stated dr Jari Favre knows she uses transportation and shouldn't make her suffer. She did schedule for tues 10/30 at 0830 stating she would try to get her nephew to bring her. She would like dr Jari Favre to reconsider and give her something else for pain

## 2017-03-25 NOTE — Telephone Encounter (Signed)
Patient medicine for muscle pain (flexoril) isn't working she need something else to help relief pain

## 2017-03-25 NOTE — Telephone Encounter (Signed)
If she is still having significant amount of pain and no relief with flexeril, please recommend that she be seen in clinic for re-evaluation of her shoulder pain.   Thank you!  [name]

## 2017-03-25 NOTE — Telephone Encounter (Signed)
From my exam 2 weeks ago, her shoulder pain was consistent with a muscle spasm and was treated as such with a muscle relaxant (flexeril). Xrays obtained that day did not show a fracture or impingement. If patient is still having significant and not improved pain in her shoulder then she needs to be re-evaluated.   Due to patient's chronic GI bleeding, I'm weary of prescribing NSAIDs. Patient can take tylenol 500mg  every 4 hours if needed (not to take more than 3000mg  in 24hr period). Prescriptions for anything else require a re-evaluation to target the cause of her pain.  ______________________  For [facility] providers who may see patient, consider evaluation with ultrasound for rotator cuff tears and can consider intra-articular joint injections. Of note, patient has previously broken [facility] pain contract (prescribed opioid was not on UTox when reported as taking, and + cocaine); patient also continues to use cocaine but is seeking counseling. If medically indicated for appropriate treatment of underlying cause, can consider short course of tramadol based on the provider's evaluation.  [name], MD IMTS - PGY2 Pager [phone]

## 2017-03-26 ENCOUNTER — Ambulatory Visit (INDEPENDENT_AMBULATORY_CARE_PROVIDER_SITE_OTHER): Payer: Medicare Other | Admitting: Internal Medicine

## 2017-03-26 VITALS — BP 125/73 | HR 90 | Temp 97.8°F | Ht 63.0 in | Wt 208.1 lb

## 2017-03-26 DIAGNOSIS — G8911 Acute pain due to trauma: Secondary | ICD-10-CM

## 2017-03-26 DIAGNOSIS — M25511 Pain in right shoulder: Secondary | ICD-10-CM

## 2017-03-26 DIAGNOSIS — Z8719 Personal history of other diseases of the digestive system: Secondary | ICD-10-CM

## 2017-03-26 DIAGNOSIS — E119 Type 2 diabetes mellitus without complications: Secondary | ICD-10-CM | POA: Diagnosis not present

## 2017-03-26 MED ORDER — DICLOFENAC SODIUM 1 % TD GEL: 4.0000 g | Freq: Four times a day (QID) | TRANSDERMAL | 1 refills | Status: DC | PRN

## 2017-03-26 NOTE — Patient Instructions (Addendum)
Ms. Patient,  It was a pleasure to see you today. I am sorry to hear about your shoulder pain. I have given you a prescription for voltaren gel. You may use this 4 times a day as needed for pain. Please return on Thursday to have an ultrasound of your shoulder.  If you have any questions or concerns, call our clinic at [phone] or after hours call [phone] and ask for the internal medicine resident on call. Thank you!  - Dr. Philipp Ovens
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
## 2017-03-26 NOTE — Progress Notes (Signed)
   CC: Shoulder pain f/u   HPI:  Ms.Damyia D [name] is a 66 y.o. female with past medical history outlined below here for shoulder pain f/u. For the details of today's visit, please refer to the assessment and plan.  Past Medical History:  Diagnosis Date  . Allergic rhinitis   . Anemia, iron deficiency 05/03/2011   2/2 GI AVMs - recieves iron infusions as well as periodic red blood cell transfusions  . Anxiety   . Arteriovenous malformation of duodenum 02/25/2015  . CARPAL TUNNEL RELEASE, HX OF 07/22/2008   Qualifier: Diagnosis of  By: Tomma Lightning MD, Claiborne Billings    . Colon polyps   . Diabetes mellitus without complication ([place])   . Difficulty sleeping    takes trazadone for sleep  . Diverticulosis   . GERD (gastroesophageal reflux disease)   . Hepatitis C    in SVR with Harvoni 12/2014-02/2015.   Marland Kitchen History of hernia repair 08/18/2012  . History of porphyria 11/30/2014   Porphyria cutanea tarda in setting of chronic Hepatitis C, frequent  IV iron infusions, and alcohol abuse   . Hypertension   . Peripheral vascular disease ([place])   . Stroke [facility]) 2010   no residual problems  . Substance use disorder    cocaine    Review of Systems  Neurological: Negative for sensory change and weakness.     Physical Exam:  Vitals:   03/26/17 0845  BP: 125/73  Pulse: 90  Temp: 97.8 F (36.6 C)  TempSrc: Oral  SpO2: 96%  Weight: 208 lb 1.6 oz (94.4 kg)  Height: 5\' 3"  (1.6 m)    Constitutional: NAD, appears comfortable Cardiovascular: RRR, no murmurs, rubs, or gallops.  Pulmonary/Chest: CTAB, no wheezes, rales, or rhonchi. d well perfused. MSK: Exam limited due to poor participation and pain. Right shoulder held in adducted and internally rotated position. Diffuse muscular tenderness to palpation of anterior shoulder and chest wall.  Psychiatric: Normal mood and affect  Assessment & Plan:   See Encounters Tab for problem based charting.  Patient discussed with Dr. Lynnae January

## 2017-03-27 DIAGNOSIS — E119 Type 2 diabetes mellitus without complications: Secondary | ICD-10-CM | POA: Diagnosis not present

## 2017-03-28 ENCOUNTER — Encounter: Payer: Self-pay | Admitting: Internal Medicine

## 2017-03-28 ENCOUNTER — Ambulatory Visit (INDEPENDENT_AMBULATORY_CARE_PROVIDER_SITE_OTHER): Payer: Medicare Other | Admitting: Internal Medicine

## 2017-03-28 DIAGNOSIS — M25411 Effusion, right shoulder: Secondary | ICD-10-CM

## 2017-03-28 DIAGNOSIS — M25511 Pain in right shoulder: Secondary | ICD-10-CM

## 2017-03-28 MED ORDER — TRAMADOL HCL 50 MG PO TABS: 50.0000 mg | ORAL_TABLET | Freq: Two times a day (BID) | ORAL | 0 refills | Status: DC | PRN

## 2017-03-28 MED FILL — traMADol HCL 50 MG TABS: 50 | 3 days supply | Qty: 6 | Fill #0
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
## 2017-03-28 NOTE — Patient Instructions (Addendum)
Been prescribed tramadol for the next 3 days for right shoulder pain.  At this pain persist or questions please contact the clinic.  Please continue to use a heating pad as well as topical solutions such as the Voltaren gel if you are able to afford this. You should also continue utilizing this right arm as much as tolerated to prevent the arm from weakening.  Thank you for your visit to [facility] clinic.
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
## 2017-03-29 DIAGNOSIS — G933 Postviral fatigue syndrome: Secondary | ICD-10-CM | POA: Diagnosis not present

## 2017-03-29 DIAGNOSIS — S78119A Complete traumatic amputation at level between unspecified hip and knee, initial encounter: Secondary | ICD-10-CM | POA: Diagnosis not present

## 2017-03-29 NOTE — Progress Notes (Signed)
Internal Medicine Clinic Attending  I saw and evaluated the patient.  I personally confirmed the key portions of the history and exam documented by Dr. Berline Lopes and I reviewed pertinent patient test results.  The assessment, diagnosis, and plan were formulated together and I agree with the documentation in the resident's note. I was present for the entirety of the procedure.  Clinical course and exam are consistent with moderate right AC joint arthritis with an effusion, and tendinopathy of the right rotator cuff without tear. She has not improved with conservative NSAID treatment. We did an ultrasound guided steroid injection into the right [facility] joint with hopes of reducing inflammation and improving pain and function.    Right AC joint showing mild cortical irregularity and moderate joint effusion, no AC joint separation seen.

## 2017-04-01 DIAGNOSIS — Z794 Long term (current) use of insulin: Secondary | ICD-10-CM | POA: Diagnosis not present

## 2017-04-01 DIAGNOSIS — Z89612 Acquired absence of left leg above knee: Secondary | ICD-10-CM | POA: Diagnosis not present

## 2017-04-01 DIAGNOSIS — M109 Gout, unspecified: Secondary | ICD-10-CM | POA: Diagnosis not present

## 2017-04-01 DIAGNOSIS — R2681 Unsteadiness on feet: Secondary | ICD-10-CM | POA: Diagnosis not present

## 2017-04-01 DIAGNOSIS — I10 Essential (primary) hypertension: Secondary | ICD-10-CM | POA: Diagnosis not present

## 2017-04-01 DIAGNOSIS — M6281 Muscle weakness (generalized): Secondary | ICD-10-CM | POA: Diagnosis not present

## 2017-04-01 DIAGNOSIS — E119 Type 2 diabetes mellitus without complications: Secondary | ICD-10-CM | POA: Diagnosis not present

## 2017-04-01 NOTE — Progress Notes (Signed)
Internal Medicine Clinic Attending  Case discussed with Dr. Guilloud at the time of the visit.  We reviewed the resident's history and exam and pertinent patient test results.  I agree with the assessment, diagnosis, and plan of care documented in the resident's note.  

## 2017-04-08 ENCOUNTER — Other Ambulatory Visit: Payer: Self-pay | Admitting: Internal Medicine

## 2017-04-08 DIAGNOSIS — I10 Essential (primary) hypertension: Secondary | ICD-10-CM

## 2017-04-10 ENCOUNTER — Encounter: Payer: Self-pay | Admitting: Internal Medicine

## 2017-04-10 ENCOUNTER — Ambulatory Visit (INDEPENDENT_AMBULATORY_CARE_PROVIDER_SITE_OTHER): Payer: Medicare Other | Admitting: Internal Medicine

## 2017-04-10 ENCOUNTER — Other Ambulatory Visit: Payer: Self-pay

## 2017-04-10 VITALS — BP 139/86 | HR 99 | Temp 98.2°F | Ht 63.0 in | Wt 204.8 lb

## 2017-04-10 DIAGNOSIS — E119 Type 2 diabetes mellitus without complications: Secondary | ICD-10-CM

## 2017-04-10 DIAGNOSIS — E118 Type 2 diabetes mellitus with unspecified complications: Secondary | ICD-10-CM

## 2017-04-10 DIAGNOSIS — M25511 Pain in right shoulder: Secondary | ICD-10-CM | POA: Diagnosis not present

## 2017-04-10 DIAGNOSIS — M25411 Effusion, right shoulder: Secondary | ICD-10-CM

## 2017-04-10 DIAGNOSIS — F1490 Cocaine use, unspecified, uncomplicated: Secondary | ICD-10-CM | POA: Diagnosis not present

## 2017-04-10 DIAGNOSIS — I10 Essential (primary) hypertension: Secondary | ICD-10-CM

## 2017-04-10 DIAGNOSIS — Z794 Long term (current) use of insulin: Secondary | ICD-10-CM

## 2017-04-10 DIAGNOSIS — F339 Major depressive disorder, recurrent, unspecified: Secondary | ICD-10-CM | POA: Diagnosis not present

## 2017-04-10 DIAGNOSIS — M109 Gout, unspecified: Secondary | ICD-10-CM | POA: Diagnosis not present

## 2017-04-10 DIAGNOSIS — E785 Hyperlipidemia, unspecified: Secondary | ICD-10-CM | POA: Diagnosis not present

## 2017-04-10 DIAGNOSIS — F4321 Adjustment disorder with depressed mood: Secondary | ICD-10-CM | POA: Diagnosis not present

## 2017-04-10 DIAGNOSIS — F17210 Nicotine dependence, cigarettes, uncomplicated: Secondary | ICD-10-CM

## 2017-04-10 DIAGNOSIS — F329 Major depressive disorder, single episode, unspecified: Secondary | ICD-10-CM

## 2017-04-10 DIAGNOSIS — B1920 Unspecified viral hepatitis C without hepatic coma: Secondary | ICD-10-CM | POA: Diagnosis not present

## 2017-04-10 DIAGNOSIS — K5521 Angiodysplasia of colon with hemorrhage: Secondary | ICD-10-CM | POA: Diagnosis not present

## 2017-04-10 DIAGNOSIS — Z89612 Acquired absence of left leg above knee: Secondary | ICD-10-CM

## 2017-04-10 DIAGNOSIS — F1990 Other psychoactive substance use, unspecified, uncomplicated: Secondary | ICD-10-CM

## 2017-04-10 DIAGNOSIS — Z79899 Other long term (current) drug therapy: Secondary | ICD-10-CM

## 2017-04-10 MED ORDER — TRAMADOL HCL 50 MG PO TABS: 50.0000 mg | ORAL_TABLET | Freq: Every day | ORAL | 0 refills | Status: DC | PRN

## 2017-04-10 MED FILL — traMADol HCL 50 MG TABS: 50 | 6 days supply | Qty: 6 | Fill #0

## 2017-04-10 MED FILL — LANTUS SOLOSTAR 100 UNITS/M: 100 | 60 days supply | Qty: 15 | Fill #2

## 2017-04-10 NOTE — Assessment & Plan Note (Signed)
Continues to have depressive symptoms complicated by acute grief due to death of her close family member. She wants to pursue counseling  Plan: --patient to make appt with [facility] - referral already approved --advised to call 911 or go to ED if she has suicidal or homicidal ideation

## 2017-04-10 NOTE — Assessment & Plan Note (Signed)
Patient endorses continued cocaine use - last use was Nov 1st. She was planning on going to a counseling session, however the group was full.  Plan: --encouraged abstinence --will continue addressing underlying mood disorder - patient to begin counseling

## 2017-04-10 NOTE — Assessment & Plan Note (Signed)
>>  ASSESSMENT AND PLAN FOR TYPE 2 DIABETES MELLITUS WITH DIABETIC NEUROPATHY ([facility]) WRITTEN ON 04/10/2017  5:26 PM BY LORETA LEVELS, MD  Patient brings in her meter today; downloaded. Readings range form 89-201, mainly staying between 100-180. She denies hypoglycemic symptoms.  Plan: --continue current regimen

## 2017-04-10 NOTE — Assessment & Plan Note (Signed)
Patient brings in her meter today; downloaded. Readings range form 89-201, mainly staying between 100-180. She denies hypoglycemic symptoms.  Plan: --continue current regimen
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
## 2017-04-10 NOTE — Assessment & Plan Note (Signed)
At last visit, patient had stopped taking diltiazem due to LE swelling and possible tongue swelling so she was only taking losartan 100mg  daily. BB had previously been stopped due to continued cocaine use; HCTZ was stopped due to patient preference in setting of precipitating gout attacks; and patient prefers not to be on amlodipine due to LE swelling. Today, patient states that she has been taking what sounds like diltiazem 180mg  daily with no problems, along with losartan 100mg  daily. She denies chest pain, shortness of breath, LE swelling.   BP well controlled today and patient appears to be tolerating diltiazem well.  Plan: --advised patient to schedule pharmacist visit in 1 week to go over her medicines as she states confusion over whether or not the pill she is taking is diltiazem (she can't name it but states XL 180) --continue losartan 100mg  daily --continue diltiazem 180mg  daily if that is what she is taking

## 2017-04-10 NOTE — Patient Instructions (Signed)
For your shoulder pain, I have refilled the tramadol (one time only) to take one pill once a day if needed for pain. I expect your pain to continue to get better as it has after the steroid shot.  I encourage you to schedule the appointment with the psychiatrist and counseling especially during this difficult time you've had recently.  If you have any thoughts of hurting yourself or other, please call 911 or go to the emergency room.

## 2017-04-11 NOTE — Progress Notes (Signed)
Internal Medicine Clinic Attending  Case discussed with Dr. Svalina  at the time of the visit.  We reviewed the resident's history and exam and pertinent patient test results.  I agree with the assessment, diagnosis, and plan of care documented in the resident's note.  

## 2017-04-12 ENCOUNTER — Other Ambulatory Visit: Payer: Self-pay | Admitting: Internal Medicine

## 2017-04-12 DIAGNOSIS — Z89612 Acquired absence of left leg above knee: Secondary | ICD-10-CM | POA: Diagnosis not present

## 2017-04-12 DIAGNOSIS — M62838 Other muscle spasm: Secondary | ICD-10-CM

## 2017-04-28 DIAGNOSIS — S78119A Complete traumatic amputation at level between unspecified hip and knee, initial encounter: Secondary | ICD-10-CM | POA: Diagnosis not present

## 2017-04-29 DIAGNOSIS — G933 Postviral fatigue syndrome: Secondary | ICD-10-CM | POA: Diagnosis not present

## 2017-05-01 DIAGNOSIS — R0789 Other chest pain: Secondary | ICD-10-CM | POA: Diagnosis not present

## 2017-05-02 DIAGNOSIS — R0789 Other chest pain: Secondary | ICD-10-CM | POA: Diagnosis not present

## 2017-05-02 DIAGNOSIS — E119 Type 2 diabetes mellitus without complications: Secondary | ICD-10-CM | POA: Diagnosis not present

## 2017-05-02 NOTE — Addendum Note (Signed)
Addended by: Hulan Fray on: 05/02/2017 06:29 PM   Modules accepted: Orders

## 2017-05-06 ENCOUNTER — Other Ambulatory Visit: Payer: Self-pay | Admitting: Internal Medicine

## 2017-05-06 DIAGNOSIS — M62838 Other muscle spasm: Secondary | ICD-10-CM

## 2017-05-10 ENCOUNTER — Other Ambulatory Visit: Payer: Self-pay | Admitting: Internal Medicine

## 2017-05-10 DIAGNOSIS — E1142 Type 2 diabetes mellitus with diabetic polyneuropathy: Secondary | ICD-10-CM

## 2017-05-10 DIAGNOSIS — I10 Essential (primary) hypertension: Secondary | ICD-10-CM

## 2017-05-29 DIAGNOSIS — S78119A Complete traumatic amputation at level between unspecified hip and knee, initial encounter: Secondary | ICD-10-CM | POA: Diagnosis not present

## 2017-05-30 MED FILL — LANTUS SOLOSTAR 100 UNITS/M: 100 | 60 days supply | Qty: 15 | Fill #0

## 2017-06-03 ENCOUNTER — Other Ambulatory Visit: Payer: Self-pay | Admitting: Internal Medicine

## 2017-06-03 DIAGNOSIS — M109 Gout, unspecified: Secondary | ICD-10-CM

## 2017-06-03 DIAGNOSIS — M62838 Other muscle spasm: Secondary | ICD-10-CM

## 2017-06-04 IMAGING — CT CT ABD-PELV W/ CM
2 of 6 series · 15 of 46 positions shown, 17 images · IV contrast (iopamidol)
Comparison: 12/30/2015

CLINICAL DATA: Unexplained sudden uncontrolled diabetes mellitus.
Question chronic pancreatitis.

EXAM:
CT ABDOMEN AND PELVIS WITH CONTRAST
TECHNIQUE: Multidetector CT imaging of the abdomen and pelvis was performed
using the standard protocol following bolus administration of
intravenous contrast.
CONTRAST:  100mL IA5FMK-RKK IOPAMIDOL (IA5FMK-RKK) INJECTION 61%

[Series 6: abdroutine 2.0 mpr cor · coronal · 0.89mm/px · 3 of 164 slices shown]
[im 55/164  soft-tissue]
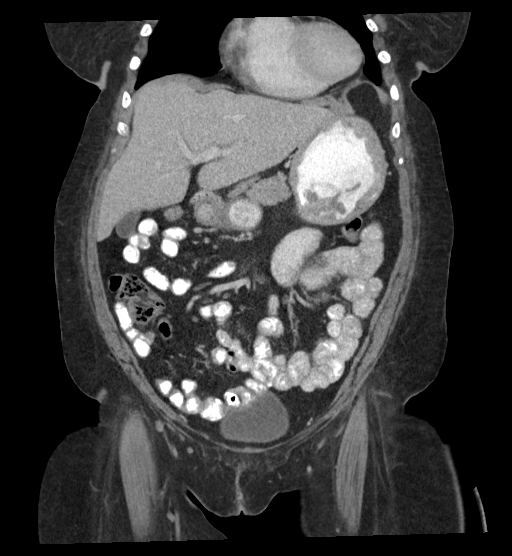
[im 73/164  soft-tissue]
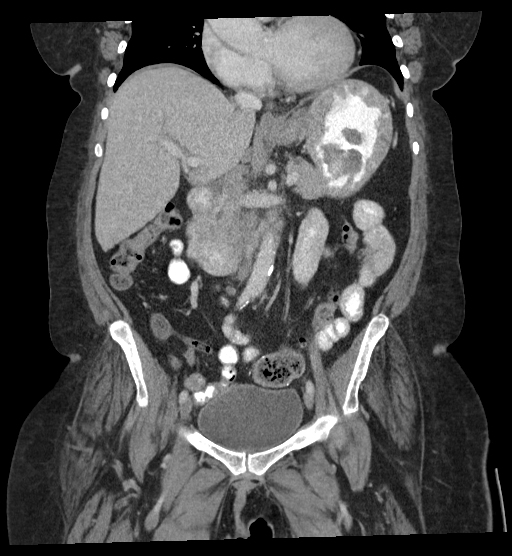
[im 91/164  soft-tissue]
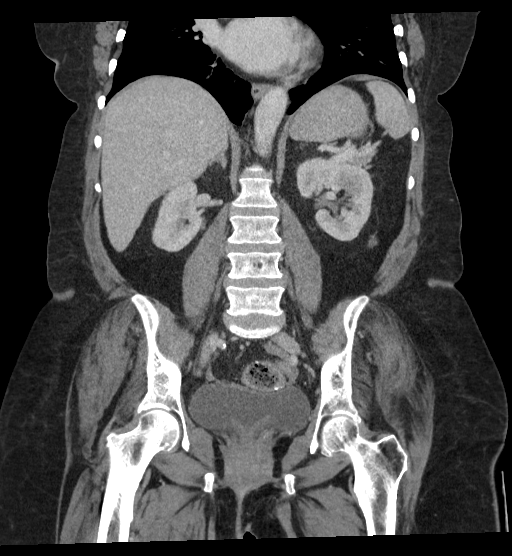

[Series 10: abdroutine 5.0 i31s 1 · axial · 0.80mm/px · z∈[+80,+470]mm · 12 of 94 slices shown, 14 images]
[im 8/94  soft-tissue]
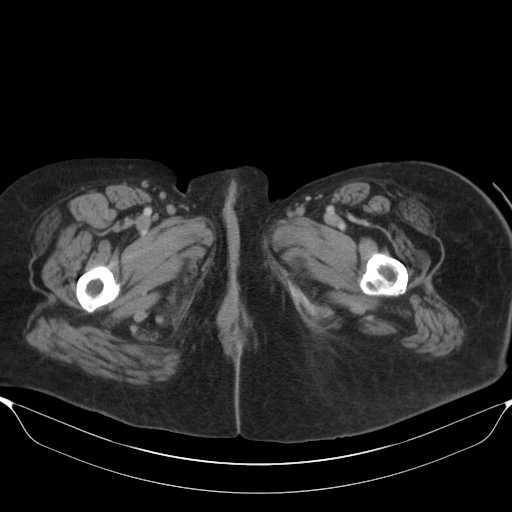
[im 8/94  bone]
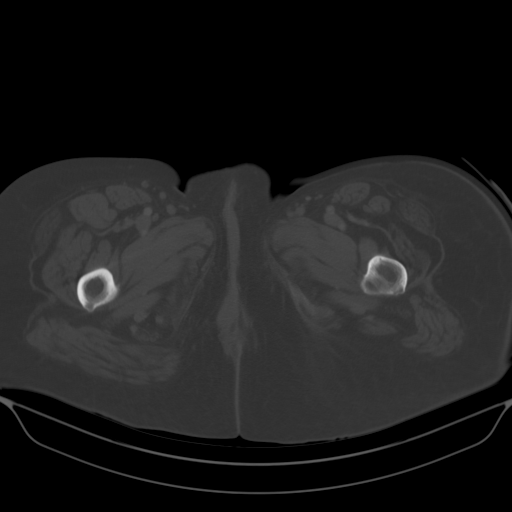
[im 15/94  soft-tissue]
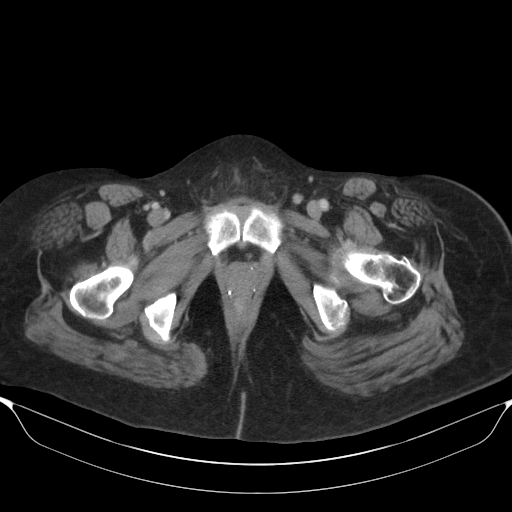
[im 22/94  soft-tissue]
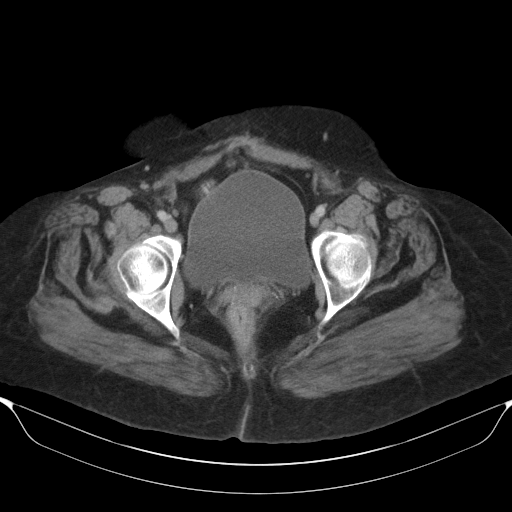
[im 29/94  soft-tissue]
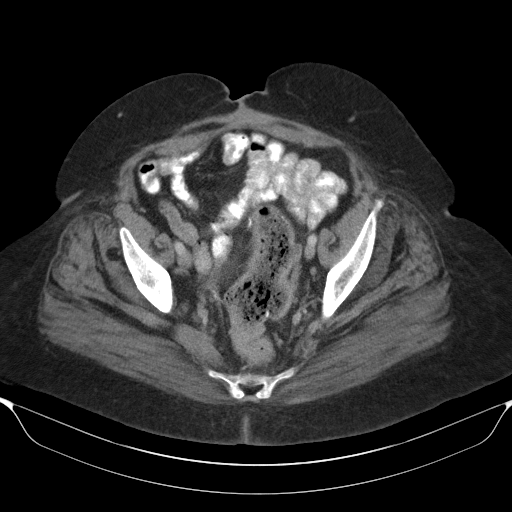
[im 36/94  soft-tissue]
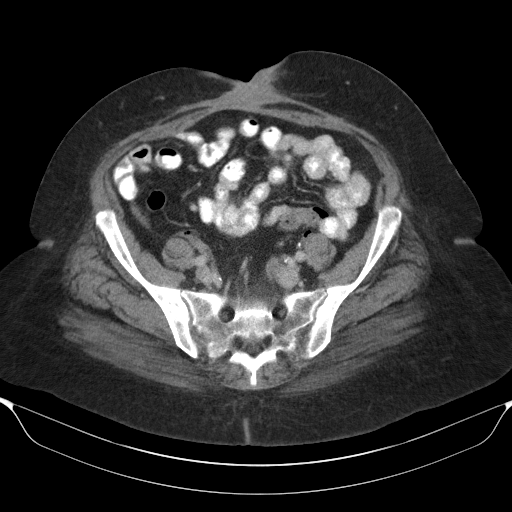
[im 43/94  soft-tissue]
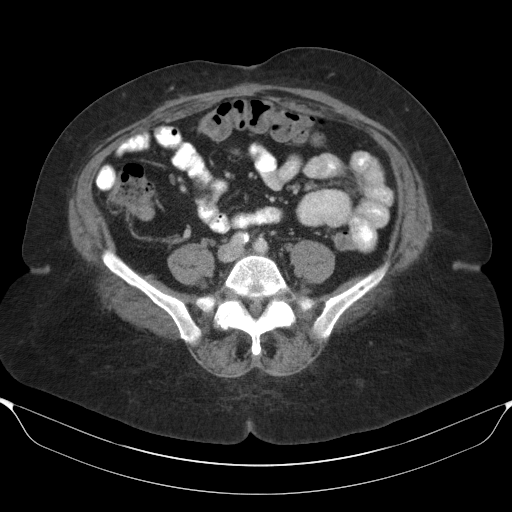
[im 51/94  soft-tissue]
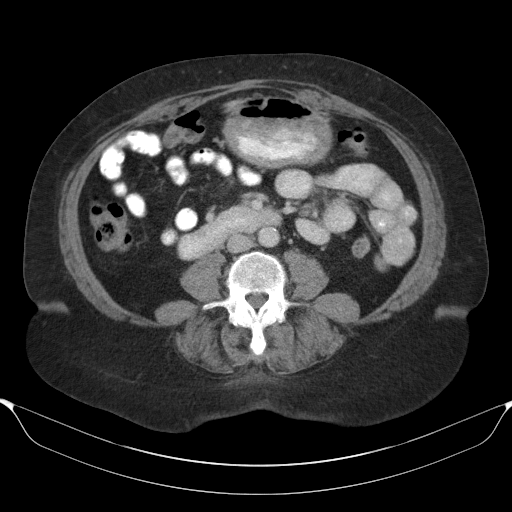
[im 58/94  soft-tissue]
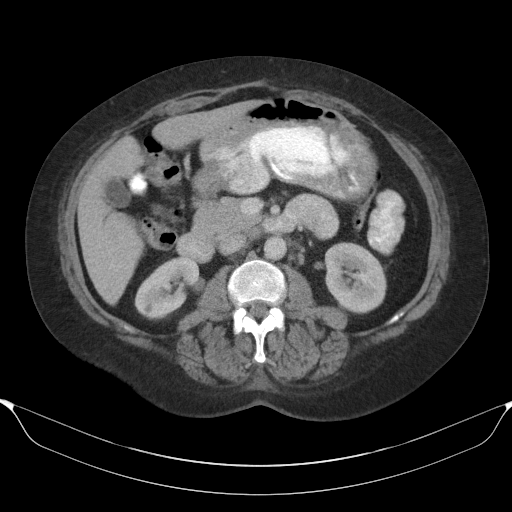
[im 65/94  soft-tissue]
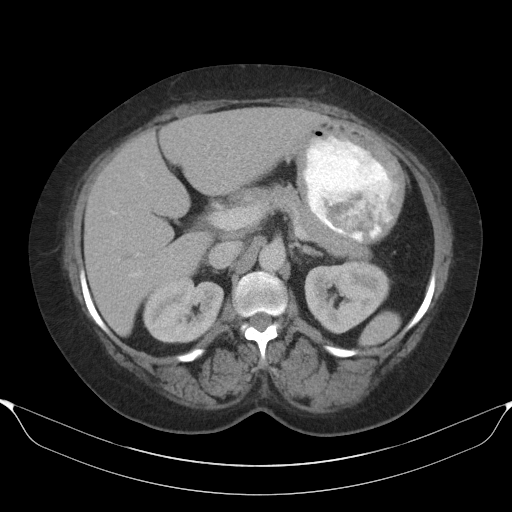
[im 65/94  bone]
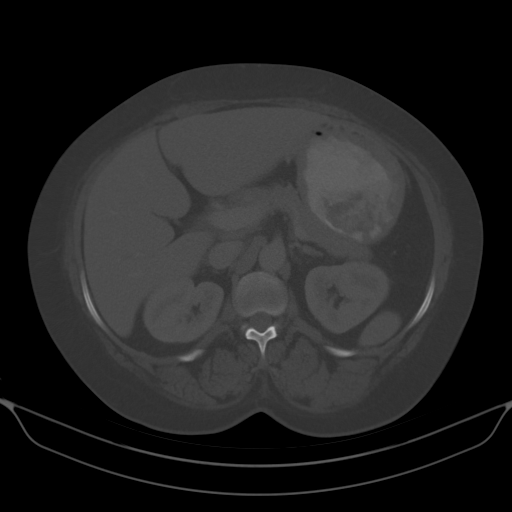
[im 72/94  soft-tissue]
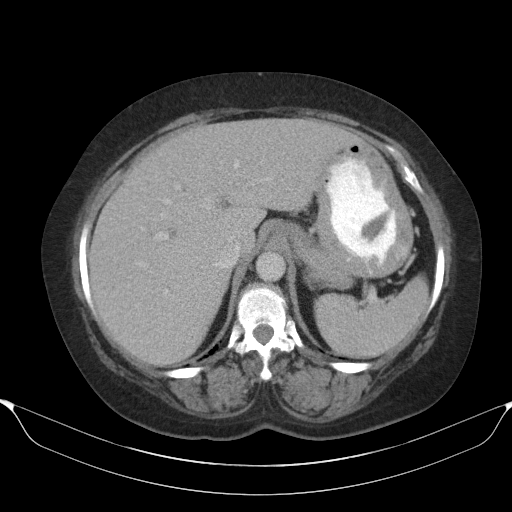
[im 79/94  soft-tissue]
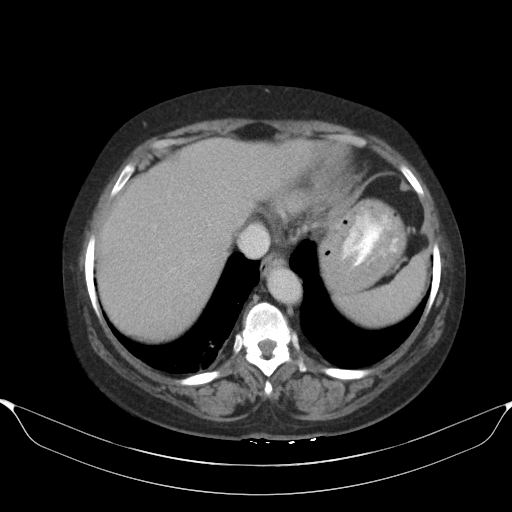
[im 86/94  soft-tissue]
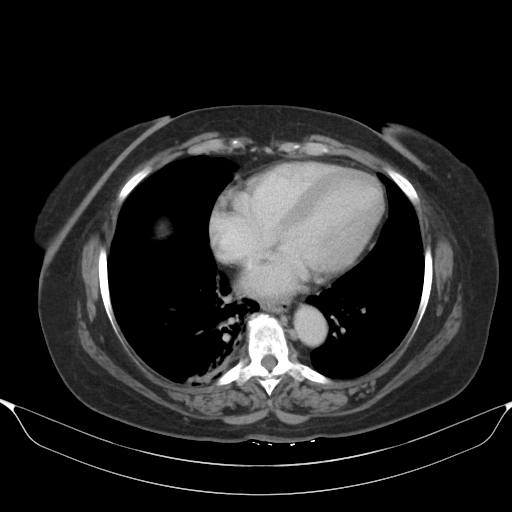

[15 of 46 positions shown; findings below may reference images not displayed]

FINDINGS: Lower chest: Chronic atelectasis and scarring in the lung bases,
mildly increased in the right lower lobe compared to the prior
study.

Hepatobiliary: No focal liver abnormality is seen. The gallbladder
is unremarkable. There is minimal prominence of the central
intrahepatic bile ducts. No significant extrahepatic biliary
dilatation is seen.

Pancreas: Unremarkable without evidence of acute or chronic
pancreatitis, mass, or ductal dilatation.

Spleen: Unremarkable.

Adrenals/Urinary Tract: Unremarkable adrenal glands. No evidence of
renal calculi or hydronephrosis. Unchanged small low-density right
renal lesions measuring up to 1 cm in size, likely cysts.
Unremarkable bladder.

Stomach/Bowel: The stomach is within normal limits. Postsurgical
changes of partial colectomy are again identified with a suture line
seen at the level of the sigmoid colon. There is no evidence of
bowel obstruction or inflammation. The appendix is unremarkable.

Vascular/Lymphatic: Abdominal aortic atherosclerosis without
aneurysm. No enlarged lymph nodes.

Reproductive: Status post hysterectomy. No adnexal masses.

Other: No intraperitoneal free fluid. Postsurgical changes in the
midline anterior abdominal wall with unchanged soft tissue
thickening.

Musculoskeletal: No acute osseous abnormality or suspicious osseous
lesion.
IMPRESSION: 1. Normal appearance of the pancreas.
2. No acute abnormality identified.

## 2017-06-05 ENCOUNTER — Other Ambulatory Visit: Payer: Self-pay | Admitting: Internal Medicine

## 2017-06-05 MED ORDER — POTASSIUM CHLORIDE ER 20 MEQ PO TBCR: 10.0000 meq | EXTENDED_RELEASE_TABLET | Freq: Every day | ORAL | 1 refills | Status: DC

## 2017-06-07 ENCOUNTER — Other Ambulatory Visit: Payer: Self-pay | Admitting: Internal Medicine

## 2017-06-11 DIAGNOSIS — G933 Postviral fatigue syndrome: Secondary | ICD-10-CM | POA: Diagnosis not present

## 2017-06-11 MED FILL — metFORMIN HCL 1000 MG TABS: 1000 | 90 days supply | Qty: 180 | Fill #1

## 2017-06-14 ENCOUNTER — Ambulatory Visit: Payer: Medicare Other

## 2017-06-17 DIAGNOSIS — E119 Type 2 diabetes mellitus without complications: Secondary | ICD-10-CM | POA: Diagnosis not present

## 2017-07-01 ENCOUNTER — Other Ambulatory Visit: Payer: Self-pay | Admitting: Internal Medicine

## 2017-07-01 DIAGNOSIS — M109 Gout, unspecified: Secondary | ICD-10-CM

## 2017-07-02 NOTE — Telephone Encounter (Signed)
Next appt scheduled  2/13 with PCP.

## 2017-07-09 ENCOUNTER — Other Ambulatory Visit: Payer: Self-pay | Admitting: Internal Medicine

## 2017-07-09 DIAGNOSIS — G933 Postviral fatigue syndrome: Secondary | ICD-10-CM | POA: Diagnosis not present

## 2017-07-09 DIAGNOSIS — N182 Chronic kidney disease, stage 2 (mild): Principal | ICD-10-CM

## 2017-07-09 DIAGNOSIS — E1165 Type 2 diabetes mellitus with hyperglycemia: Secondary | ICD-10-CM

## 2017-07-09 DIAGNOSIS — IMO0002 Reserved for concepts with insufficient information to code with codable children: Secondary | ICD-10-CM

## 2017-07-09 DIAGNOSIS — E1122 Type 2 diabetes mellitus with diabetic chronic kidney disease: Secondary | ICD-10-CM

## 2017-07-09 MED ORDER — POTASSIUM CHLORIDE ER 20 MEQ PO TBCR: 20.0000 meq | EXTENDED_RELEASE_TABLET | Freq: Every day | ORAL | 1 refills | Status: DC

## 2017-07-09 MED ORDER — INSULIN PEN NEEDLE 32G X 4 MM MISC: 0 refills | Status: DC

## 2017-07-09 MED FILL — UNIFINE PENTIPS 32GX5/32: 32G X 4 MM | 90 days supply | Qty: 100 | Fill #0

## 2017-07-09 MED FILL — UNIFINE PENTIPS 32GX5/32": 32G X 4 MM | 90 days supply | Qty: 100 | Fill #0

## 2017-07-09 NOTE — Progress Notes (Addendum)
   CC: diabetes  HPI:  [name] is a 67 y.o. with a PMH of HCV in SVR, T2DM, HTN, HLD, gout, MDD, chronic GI bleeding from AVMs, and cocaine use disorder presenting to clinic for follow up on HTN, diabetes, and evaluation of tachycardia.  Please see problem based Assessment and Plan for status of patients chronic conditions.  Past Medical History:  Diagnosis Date  . Allergic rhinitis   . Anemia, iron deficiency 05/03/2011   2/2 GI AVMs - recieves iron infusions as well as periodic red blood cell transfusions  . Anxiety   . Arteriovenous malformation of duodenum 02/25/2015  . CARPAL TUNNEL RELEASE, HX OF 07/22/2008   Qualifier: Diagnosis of  By: Tomma Lightning MD, Claiborne Billings    . Colon polyps   . Diabetes mellitus without complication ([place])   . Difficulty sleeping    takes trazadone for sleep  . Diverticulosis   . GERD (gastroesophageal reflux disease)   . Hepatitis C    in SVR with Harvoni 12/2014-02/2015.   Marland Kitchen History of hernia repair 08/18/2012  . History of porphyria 11/30/2014   Porphyria cutanea tarda in setting of chronic Hepatitis C, frequent  IV iron infusions, and alcohol abuse   . Hypertension   . Peripheral vascular disease ([place])   . Stroke [facility]) 2010   no residual problems  . Substance use disorder    cocaine    Review of Systems:   Review of Systems  Constitutional: Positive for fever (subjective). Negative for chills and diaphoresis.  HENT: Positive for congestion. Negative for sinus pain and sore throat.   Eyes: Negative for blurred vision and double vision.  Respiratory: Positive for cough (initially productive, now dry). Negative for hemoptysis, shortness of breath and wheezing.   Cardiovascular: Positive for leg swelling (unchanged). Negative for chest pain and palpitations.  Gastrointestinal: Positive for melena. Negative for abdominal pain, constipation, diarrhea, nausea and vomiting.  Genitourinary: Negative for frequency.  Neurological: Negative for  dizziness, focal weakness, weakness and headaches.  Psychiatric/Behavioral: Positive for substance abuse.   Physical Exam:  Vitals:   07/10/17 1450 07/10/17 1458  BP: 140/77   Pulse: (!) 120   Temp: 98.8 F (37.1 C)   TempSrc: Oral   SpO2: 97% 97%  Weight: 210 lb (95.3 kg)   Height: 5\' 3"  (1.6 m)    GENERAL- alert, co-operative, appears as stated age, not in any distress. HEENT-  EOMI, oral mucosa appears moist, mild pharyngeal erythema CARDIAC- regular rhythm, tachycardia, no murmurs, rubs or gallops. RESP- Moving equal volumes of air, diminished breath sounds throughout, no wheezes or crackles. ABDOMEN- Soft, nontender, bowel sounds present. NEURO- No obvious Cr N abnormality. EXTREMITIES- s/p L AKA, pulse 2+ DP/PT RLE with pedal edema SKIN- Warm, dry. PSYCH- Normal mood and affect, appropriate thought content and speech.  Assessment & Plan:   See Encounters Tab for problem based charting.   Patient discussed with Dr. Moshe Cipro, MD Internal Medicine PGY2

## 2017-07-09 NOTE — Telephone Encounter (Signed)
[facility] requesting new Rx for Potassium 10 meq tab (since instruction is to take 10 meq daily). Last written 06/05/17 was 20 meq tab. Has appt with pcp 07/10/17.

## 2017-07-09 NOTE — Telephone Encounter (Signed)
Jule Economy with physician pharmacy needs to speak with a nurse about a direction on potassium chloride 20 MEQ TBCR. Please call back.

## 2017-07-10 ENCOUNTER — Ambulatory Visit: Payer: Medicare Other | Admitting: Dietician

## 2017-07-10 ENCOUNTER — Other Ambulatory Visit: Payer: Self-pay | Admitting: Internal Medicine

## 2017-07-10 ENCOUNTER — Encounter: Payer: Self-pay | Admitting: Internal Medicine

## 2017-07-10 ENCOUNTER — Ambulatory Visit (HOSPITAL_COMMUNITY)
Admission: RE | Admit: 2017-07-10 | Discharge: 2017-07-10 | Disposition: A | Discharge status: Home / Self Care | Payer: Medicare Other | Source: Ambulatory Visit | Attending: Family Medicine | Admitting: Family Medicine

## 2017-07-10 ENCOUNTER — Encounter: Payer: Self-pay | Admitting: Dietician

## 2017-07-10 ENCOUNTER — Ambulatory Visit (INDEPENDENT_AMBULATORY_CARE_PROVIDER_SITE_OTHER): Payer: Medicare Other | Admitting: Internal Medicine

## 2017-07-10 ENCOUNTER — Other Ambulatory Visit: Payer: Self-pay

## 2017-07-10 VITALS — BP 140/77 | HR 120 | Temp 98.8°F | Ht 63.0 in | Wt 210.0 lb

## 2017-07-10 DIAGNOSIS — R059 Cough, unspecified: Secondary | ICD-10-CM

## 2017-07-10 DIAGNOSIS — Z79899 Other long term (current) drug therapy: Secondary | ICD-10-CM

## 2017-07-10 DIAGNOSIS — I440 Atrioventricular block, first degree: Secondary | ICD-10-CM | POA: Insufficient documentation

## 2017-07-10 DIAGNOSIS — E785 Hyperlipidemia, unspecified: Secondary | ICD-10-CM | POA: Insufficient documentation

## 2017-07-10 DIAGNOSIS — R0981 Nasal congestion: Secondary | ICD-10-CM | POA: Diagnosis not present

## 2017-07-10 DIAGNOSIS — IMO0001 Reserved for inherently not codable concepts without codable children: Secondary | ICD-10-CM

## 2017-07-10 DIAGNOSIS — K5521 Angiodysplasia of colon with hemorrhage: Secondary | ICD-10-CM | POA: Diagnosis not present

## 2017-07-10 DIAGNOSIS — F1290 Cannabis use, unspecified, uncomplicated: Secondary | ICD-10-CM | POA: Diagnosis not present

## 2017-07-10 DIAGNOSIS — I739 Peripheral vascular disease, unspecified: Secondary | ICD-10-CM | POA: Insufficient documentation

## 2017-07-10 DIAGNOSIS — F1410 Cocaine abuse, uncomplicated: Secondary | ICD-10-CM | POA: Diagnosis not present

## 2017-07-10 DIAGNOSIS — E118 Type 2 diabetes mellitus with unspecified complications: Secondary | ICD-10-CM | POA: Insufficient documentation

## 2017-07-10 DIAGNOSIS — R509 Fever, unspecified: Secondary | ICD-10-CM | POA: Diagnosis not present

## 2017-07-10 DIAGNOSIS — F1990 Other psychoactive substance use, unspecified, uncomplicated: Secondary | ICD-10-CM

## 2017-07-10 DIAGNOSIS — R05 Cough: Secondary | ICD-10-CM | POA: Diagnosis not present

## 2017-07-10 DIAGNOSIS — Z794 Long term (current) use of insulin: Secondary | ICD-10-CM | POA: Diagnosis not present

## 2017-07-10 DIAGNOSIS — M1A9XX0 Chronic gout, unspecified, without tophus (tophi): Secondary | ICD-10-CM | POA: Diagnosis not present

## 2017-07-10 DIAGNOSIS — K5790 Diverticulosis of intestine, part unspecified, without perforation or abscess without bleeding: Secondary | ICD-10-CM | POA: Diagnosis not present

## 2017-07-10 DIAGNOSIS — E1165 Type 2 diabetes mellitus with hyperglycemia: Secondary | ICD-10-CM

## 2017-07-10 DIAGNOSIS — F329 Major depressive disorder, single episode, unspecified: Secondary | ICD-10-CM | POA: Diagnosis not present

## 2017-07-10 DIAGNOSIS — E119 Type 2 diabetes mellitus without complications: Secondary | ICD-10-CM | POA: Diagnosis not present

## 2017-07-10 DIAGNOSIS — E669 Obesity, unspecified: Secondary | ICD-10-CM | POA: Diagnosis not present

## 2017-07-10 DIAGNOSIS — F1490 Cocaine use, unspecified, uncomplicated: Secondary | ICD-10-CM

## 2017-07-10 DIAGNOSIS — I10 Essential (primary) hypertension: Secondary | ICD-10-CM

## 2017-07-10 DIAGNOSIS — Z6837 Body mass index (BMI) 37.0-37.9, adult: Secondary | ICD-10-CM

## 2017-07-10 DIAGNOSIS — M10071 Idiopathic gout, right ankle and foot: Secondary | ICD-10-CM | POA: Diagnosis not present

## 2017-07-10 DIAGNOSIS — Z89612 Acquired absence of left leg above knee: Secondary | ICD-10-CM

## 2017-07-10 DIAGNOSIS — R000 Tachycardia, unspecified: Secondary | ICD-10-CM

## 2017-07-10 DIAGNOSIS — K219 Gastro-esophageal reflux disease without esophagitis: Secondary | ICD-10-CM | POA: Insufficient documentation

## 2017-07-10 DIAGNOSIS — Z8619 Personal history of other infectious and parasitic diseases: Secondary | ICD-10-CM

## 2017-07-10 DIAGNOSIS — F17200 Nicotine dependence, unspecified, uncomplicated: Secondary | ICD-10-CM

## 2017-07-10 HISTORY — DX: Tachycardia, unspecified: R00.0

## 2017-07-10 LAB — GLUCOSE, CAPILLARY: Glucose-Capillary: 102 mg/dL — ABNORMAL HIGH (ref 65–99)

## 2017-07-10 LAB — POCT GLYCOSYLATED HEMOGLOBIN (HGB A1C): Hemoglobin A1C: 4.9

## 2017-07-10 MED ORDER — INSULIN GLARGINE 100 UNIT/ML SOLOSTAR PEN: 10.0000 [IU] | PEN_INJECTOR | Freq: Every day | SUBCUTANEOUS | 2 refills | Status: DC

## 2017-07-10 MED ORDER — INSULIN GLARGINE 100 UNIT/ML SOLOSTAR PEN: 25.0000 [IU] | PEN_INJECTOR | Freq: Every day | SUBCUTANEOUS | 2 refills | Status: DC

## 2017-07-10 NOTE — Patient Instructions (Signed)
Change your lantus to 10 units at night; continue your metformin. If you have low sugars (less than 80), call the clinic.  We will send you for an ultrasound of your heart since your have a high heart rate. I will also check your blood level. Please come back in 2 weeks for re-check. If you start feeling poorly before then, please come in earlier.

## 2017-07-10 NOTE — Assessment & Plan Note (Deleted)
Cocaine, marijuana - started counseling with "[facility]"

## 2017-07-10 NOTE — Assessment & Plan Note (Signed)
>>  ASSESSMENT AND PLAN FOR TYPE 2 DIABETES MELLITUS WITH DIABETIC NEUROPATHY ([facility]) WRITTEN ON 07/10/2017  6:16 PM BY LORETA LEVELS, MD  Patient compliant with metformin  1000mg  BID and lantus  25 units qhs. She brought her glucometer which shows CBG ranging from 84-205 but mainly 100-130. She denies symptoms of hypoglycemia or hyperglycemia.   Plan: --A1c today - 4.9  --continue metformin  1000mg  BID --decrease Lantus  to 10 units qhs --advised patient to continue CBG monitoring; advised to call clinic if having CBGs <80. --f/u in 3 months

## 2017-07-10 NOTE — Assessment & Plan Note (Signed)
BP borderline today at 140/77. She states compliance with losartan 100mg  daily and diltiazem 180mg  daily. She denies chest pain, shortness of breath, palpitations; she endorses stable swelling of LLE.   Patient has declined thiazides in past due to inc risk of gout flare; she was taken off of BB due to continued cocaine use.  Plan: --continue current regimen

## 2017-07-10 NOTE — Assessment & Plan Note (Signed)
Patient endorses 3 days of cough with subjective fevers at home. She states was initially mildly productive with tan sputum but now is nonproductive. She denies hemoptysis, chest pain, rhinorrhea, nasal congestion, nausea, vomiting.   Exam reveals decreased breath sounds throughout, no rales or wheezes. Mild pharyngeal erythema, no lymphadenopathy.   Plan: --symptomatic treatment with cough drops, inc fluid intake --advised to avoid OTC regimens with pseudoephedrine

## 2017-07-10 NOTE — Assessment & Plan Note (Signed)
Patient started counseling with [facility] since last visit and is finding it very helpful. She did have cocaine+marijuana use 1 week ago when her sister was transferred to hospice care. She is feeling encouraged that she can stop using cocaine.

## 2017-07-10 NOTE — Assessment & Plan Note (Signed)
Patient compliant with metformin 1000mg  BID and lantus 25 units qhs. She brought her glucometer which shows CBG ranging from 84-205 but mainly 100-130. She denies symptoms of hypoglycemia or hyperglycemia.   Plan: --A1c today - 4.9  --continue metformin 1000mg  BID --decrease Lantus to 10 units qhs --advised patient to continue CBG monitoring; advised to call clinic if having CBGs <80. --f/u in 3 months

## 2017-07-10 NOTE — Assessment & Plan Note (Addendum)
Patient with asymptomatic tachycardia today. She appears euvolemic on exam. She did have OTC Dayquil for her cough which may contain dextromorphan or pseudoephedrine but would not expect tachycardia to 120s. Her last cocaine use was 1 week ago.   On exam, she has normal S1/S2, w/o murmurs or rubs; diminished breath sounds throughout w/o rales; RLE edema which is stable from previous exams w/o calf tenderness. She has RF for PE of smoking, obesity and ~sedentary lifestyle 2/2 L AKA, however clinical picture does not support this diagnosis at this time.   EKG today - personally reviewed - sinus tachycardia, 1st AV block, no ST segment elevation, nonspecific TW changes.  Plan: --will check CBC as she does have h/o chronic GI bleeding 2/2 AVMs - last CBC was in Sept with Hg 11.3 --Echo as she has RF for CAD and HF --will f/u in 2 weeks --advised patient to return sooner if she becomes symptomatic  Addendum:  Hgb down to 8.0 from 11.3 in Sept which could be contributing factor to tachycardia. I contacted her hematologist Dr. Burr Medico who stated that patient's usual transfusion threshold has been ~8. She will contact patient for repeat labs and possible transfusion. Will still plan on 2wk follow up with [place] for the tachycardia (or sooner if symptoms arise).

## 2017-07-10 NOTE — Assessment & Plan Note (Signed)
Patient compliant with allopurinol 300mg  daily and cochicine 0.6mg  daily. No gout flares.  Plan: --f/u uric acid

## 2017-07-11 ENCOUNTER — Telehealth: Payer: Self-pay | Admitting: Internal Medicine

## 2017-07-11 ENCOUNTER — Other Ambulatory Visit: Payer: Self-pay | Admitting: Hematology

## 2017-07-11 ENCOUNTER — Telehealth: Payer: Self-pay | Admitting: Hematology

## 2017-07-11 DIAGNOSIS — D500 Iron deficiency anemia secondary to blood loss (chronic): Secondary | ICD-10-CM

## 2017-07-11 LAB — CBC
HEMOGLOBIN: 8 g/dL — AB (ref 11.1–15.9)
Hematocrit: 27.3 % — ABNORMAL LOW (ref 34.0–46.6)
MCH: 26.7 pg (ref 26.6–33.0)
MCHC: 29.3 g/dL — AB (ref 31.5–35.7)
MCV: 91 fL (ref 79–97)
PLATELETS: 450 10*3/uL — AB (ref 150–379)
RBC: 3 x10E6/uL — ABNORMAL LOW (ref 3.77–5.28)
RDW: 18.9 % — ABNORMAL HIGH (ref 12.3–15.4)
WBC: 7.4 10*3/uL (ref 3.4–10.8)

## 2017-07-11 LAB — URIC ACID: URIC ACID: 3.8 mg/dL (ref 2.5–7.1)

## 2017-07-11 NOTE — Telephone Encounter (Signed)
Called [facility], clarified dose of lantus.

## 2017-07-11 NOTE — Telephone Encounter (Signed)
Prescription Clarification please call back as 2 RX's sent and they have questions.   Insulin Glargine (LANTUS) 100 UNIT/ML Solostar Pen

## 2017-07-11 NOTE — Progress Notes (Signed)
Asked patient to reschedule her visit.

## 2017-07-11 NOTE — Telephone Encounter (Signed)
Scheduled appt per 2/14 sch msg - put on 1/18 due to patient's transportation. Spoke with patient regarding appts.

## 2017-07-12 NOTE — Progress Notes (Signed)
Internal Medicine Clinic Attending  Case discussed with Dr. Svalina  at the time of the visit.  We reviewed the resident's history and exam and pertinent patient test results.  I agree with the assessment, diagnosis, and plan of care documented in the resident's note.  Alexander N Raines, MD   

## 2017-07-15 ENCOUNTER — Inpatient Hospital Stay: Payer: Medicare Other | Attending: Nurse Practitioner | Admitting: Nurse Practitioner

## 2017-07-15 ENCOUNTER — Encounter: Payer: Self-pay | Admitting: Nurse Practitioner

## 2017-07-15 ENCOUNTER — Inpatient Hospital Stay: Payer: Medicare Other

## 2017-07-15 ENCOUNTER — Telehealth: Payer: Self-pay | Admitting: Nurse Practitioner

## 2017-07-15 VITALS — BP 121/69 | HR 106 | Temp 98.5°F | Resp 18 | Ht 63.0 in

## 2017-07-15 VITALS — BP 134/78

## 2017-07-15 DIAGNOSIS — E119 Type 2 diabetes mellitus without complications: Secondary | ICD-10-CM | POA: Insufficient documentation

## 2017-07-15 DIAGNOSIS — Z89512 Acquired absence of left leg below knee: Secondary | ICD-10-CM | POA: Diagnosis not present

## 2017-07-15 DIAGNOSIS — D500 Iron deficiency anemia secondary to blood loss (chronic): Secondary | ICD-10-CM

## 2017-07-15 DIAGNOSIS — K922 Gastrointestinal hemorrhage, unspecified: Secondary | ICD-10-CM

## 2017-07-15 DIAGNOSIS — B182 Chronic viral hepatitis C: Secondary | ICD-10-CM | POA: Diagnosis not present

## 2017-07-15 DIAGNOSIS — R5383 Other fatigue: Secondary | ICD-10-CM | POA: Diagnosis not present

## 2017-07-15 DIAGNOSIS — F5089 Other specified eating disorder: Secondary | ICD-10-CM | POA: Diagnosis not present

## 2017-07-15 DIAGNOSIS — Z79899 Other long term (current) drug therapy: Secondary | ICD-10-CM | POA: Insufficient documentation

## 2017-07-15 LAB — FERRITIN: Ferritin: 14 ng/mL (ref 9–269)

## 2017-07-15 LAB — COMPREHENSIVE METABOLIC PANEL
ALT: 30 U/L (ref 0–55)
ANION GAP: 8 (ref 3–11)
AST: 39 U/L — ABNORMAL HIGH (ref 5–34)
Albumin: 3.8 g/dL (ref 3.5–5.0)
Alkaline Phosphatase: 66 U/L (ref 40–150)
BUN: 29 mg/dL — ABNORMAL HIGH (ref 7–26)
CHLORIDE: 107 mmol/L (ref 98–109)
CO2: 23 mmol/L (ref 22–29)
Calcium: 9.4 mg/dL (ref 8.4–10.4)
Creatinine, Ser: 1.13 mg/dL — ABNORMAL HIGH (ref 0.60–1.10)
GFR calc non Af Amer: 50 mL/min — ABNORMAL LOW (ref >60)
GFR, EST AFRICAN AMERICAN: 58 mL/min — AB (ref >60)
Glucose, Bld: 120 mg/dL (ref 70–140)
POTASSIUM: 4.7 mmol/L (ref 3.5–5.1)
SODIUM: 138 mmol/L (ref 136–145)
Total Bilirubin: <0.2 mg/dL — ABNORMAL LOW (ref 0.2–1.2)
Total Protein: 7 g/dL (ref 6.4–8.3)

## 2017-07-15 LAB — CBC WITH DIFFERENTIAL/PLATELET
Basophils Absolute: 0 10*3/uL (ref 0.0–0.1)
Basophils Relative: 0 %
Eosinophils Absolute: 0.2 10*3/uL (ref 0.0–0.5)
Eosinophils Relative: 4 %
HEMATOCRIT: 29.6 % — AB (ref 34.8–46.6)
HEMOGLOBIN: 8.4 g/dL — AB (ref 11.6–15.9)
LYMPHS ABS: 1.2 10*3/uL (ref 0.9–3.3)
LYMPHS PCT: 21 %
MCH: 26.2 pg (ref 25.1–34.0)
MCHC: 28.4 g/dL — ABNORMAL LOW (ref 31.5–36.0)
MCV: 92.2 fL (ref 79.5–101.0)
Monocytes Absolute: 1.1 10*3/uL — ABNORMAL HIGH (ref 0.1–0.9)
Monocytes Relative: 18 %
NEUTROS ABS: 3.3 10*3/uL (ref 1.5–6.5)
NEUTROS PCT: 57 %
Platelets: 358 10*3/uL (ref 145–400)
RBC: 3.21 MIL/uL — ABNORMAL LOW (ref 3.70–5.45)
RDW: 17.3 % — ABNORMAL HIGH (ref 11.2–14.5)
WBC: 5.8 10*3/uL (ref 3.9–10.3)

## 2017-07-15 LAB — IRON AND TIBC
IRON: 138 ug/dL (ref 41–142)
Saturation Ratios: 30 % (ref 21–57)
TIBC: 454 ug/dL — AB (ref 236–444)
UIBC: 316 ug/dL

## 2017-07-15 MED ORDER — DIPHENHYDRAMINE HCL 50 MG/ML IJ SOLN
INTRAMUSCULAR | Status: AC
  Filled 2017-07-15: qty 1

## 2017-07-15 MED ORDER — SODIUM CHLORIDE 0.9 % IV SOLN
510.0000 mg | Freq: Once | INTRAVENOUS | Status: AC
  Administered 2017-07-15: 510 mg via INTRAVENOUS
  Filled 2017-07-15: qty 17

## 2017-07-15 MED ORDER — DIPHENHYDRAMINE HCL 50 MG/ML IJ SOLN
50.0000 mg | Freq: Once | INTRAMUSCULAR | Status: AC
  Administered 2017-07-15: 50 mg via INTRAVENOUS

## 2017-07-15 MED ORDER — FAMOTIDINE 20 MG PO TABS
ORAL_TABLET | ORAL | Status: AC
  Filled 2017-07-15: qty 1

## 2017-07-15 MED ORDER — FAMOTIDINE 20 MG PO TABS
20.0000 mg | ORAL_TABLET | Freq: Once | ORAL | Status: AC
  Administered 2017-07-15: 20 mg via ORAL

## 2017-07-15 MED ORDER — FAMOTIDINE IN NACL 20-0.9 MG/50ML-% IV SOLN: 20.0000 mg | Freq: Once | INTRAVENOUS | Status: DC

## 2017-07-15 NOTE — Progress Notes (Addendum)
  [facility] OFFICE PROGRESS NOTE   Diagnosis: Iron deficiency anemia due to chronic blood loss  INTERVAL HISTORY:   [name] was last seen at the [facility] 01/31/2017.  She left received Feraheme 01/31/2017.  She had labs done at another office 07/10/2017.  The hemoglobin returned at 8.0.  This compares to 11.3 on 01/31/2017.  Denies known bleeding.  Energy level is poor.  She craves ice.  She has periodic dyspnea.  She feels "congested".  Hands feel "tight".  Objective:  Vital signs in last 24 hours:  Blood pressure 121/69, pulse (!) 106, temperature 98.5 F (36.9 C), temperature source Oral, resp. rate 18, height 5\' 3"  (1.6 m), SpO2 100 %.    HEENT: No thrush or ulcers. Resp: Lungs clear bilaterally. Cardio: Regular rate and rhythm. GI: Abdomen soft and nontender.  No hepatomegaly. Vascular: Trace right pretibial edema.  Status post left BKA.   Lab Results:  Lab Results  Component Value Date   WBC 5.8 07/15/2017   HGB 8.4 (L) 07/15/2017   HCT 29.6 (L) 07/15/2017   MCV 92.2 07/15/2017   PLT 358 07/15/2017   NEUTROABS 3.3 07/15/2017    Imaging:  No results found.  Medications: I have reviewed the patient's current medications.  Assessment/Plan: 1. Iron deficiency anemia secondary to GI blood loss 2. Porphyria cutaneous tarda 3. Chronic hepatitis C infection status post treatment 2016 4. Diabetes mellitus type 2 5. History of left BKA    Disposition: [name] has progressive anemia most likely due to chronic GI blood loss/iron deficiency.  She will receive a dose of Feraheme today.   Labs will be checked on a monthly basis.  She will return for a follow-up visit in 3 months.  Patient seen with Dr. Burr Medico.    Ned Card ANP/GNP-BC   07/15/2017  2:27 PM   Addendum  [name] has lost f/u for about 5 months due to scheduling issues. She has developed anemia again and ferritin was 14 today, TIBC slightly elevated. I recommend iv iron  infusion today, once only this time.  She has not had porphyria cutaneous tarda flare since her hep c been successfully treated.  We will set up routine lab to check her CBC and iron study every months, and I will see her back in 3 months.  Truitt Merle  07/15/2017

## 2017-07-15 NOTE — Patient Instructions (Signed)

## 2017-07-15 NOTE — Telephone Encounter (Signed)
Scheduled appt per 2/18 los - Gave patient AVS and calender per los.

## 2017-07-18 ENCOUNTER — Ambulatory Visit: Payer: Medicare Other | Admitting: Hematology

## 2017-07-19 ENCOUNTER — Encounter: Payer: Self-pay | Admitting: Nurse Practitioner

## 2017-07-22 ENCOUNTER — Telehealth: Payer: Self-pay | Admitting: Hematology

## 2017-07-22 DIAGNOSIS — E119 Type 2 diabetes mellitus without complications: Secondary | ICD-10-CM | POA: Diagnosis not present

## 2017-07-22 NOTE — Telephone Encounter (Signed)
Appointment for 2/28 cancelled per 2/22 sch msg and patient was notified also.

## 2017-07-24 ENCOUNTER — Ambulatory Visit (INDEPENDENT_AMBULATORY_CARE_PROVIDER_SITE_OTHER): Payer: Medicare Other | Admitting: Internal Medicine

## 2017-07-24 ENCOUNTER — Ambulatory Visit (INDEPENDENT_AMBULATORY_CARE_PROVIDER_SITE_OTHER): Payer: Medicare Other | Admitting: Dietician

## 2017-07-24 ENCOUNTER — Other Ambulatory Visit: Payer: Self-pay

## 2017-07-24 ENCOUNTER — Encounter: Payer: Self-pay | Admitting: Internal Medicine

## 2017-07-24 VITALS — BP 146/88 | HR 101 | Temp 98.1°F | Ht 63.0 in | Wt 213.1 lb

## 2017-07-24 DIAGNOSIS — F329 Major depressive disorder, single episode, unspecified: Secondary | ICD-10-CM | POA: Diagnosis not present

## 2017-07-24 DIAGNOSIS — E785 Hyperlipidemia, unspecified: Secondary | ICD-10-CM | POA: Diagnosis not present

## 2017-07-24 DIAGNOSIS — Z6837 Body mass index (BMI) 37.0-37.9, adult: Secondary | ICD-10-CM

## 2017-07-24 DIAGNOSIS — D649 Anemia, unspecified: Secondary | ICD-10-CM | POA: Diagnosis not present

## 2017-07-24 DIAGNOSIS — R000 Tachycardia, unspecified: Secondary | ICD-10-CM

## 2017-07-24 DIAGNOSIS — R946 Abnormal results of thyroid function studies: Secondary | ICD-10-CM | POA: Diagnosis not present

## 2017-07-24 DIAGNOSIS — F1490 Cocaine use, unspecified, uncomplicated: Secondary | ICD-10-CM

## 2017-07-24 DIAGNOSIS — Z713 Dietary counseling and surveillance: Secondary | ICD-10-CM

## 2017-07-24 DIAGNOSIS — F1290 Cannabis use, unspecified, uncomplicated: Secondary | ICD-10-CM

## 2017-07-24 DIAGNOSIS — F17210 Nicotine dependence, cigarettes, uncomplicated: Secondary | ICD-10-CM

## 2017-07-24 DIAGNOSIS — I10 Essential (primary) hypertension: Secondary | ICD-10-CM | POA: Diagnosis not present

## 2017-07-24 DIAGNOSIS — F17200 Nicotine dependence, unspecified, uncomplicated: Secondary | ICD-10-CM

## 2017-07-24 DIAGNOSIS — Z794 Long term (current) use of insulin: Secondary | ICD-10-CM

## 2017-07-24 DIAGNOSIS — I440 Atrioventricular block, first degree: Secondary | ICD-10-CM | POA: Diagnosis not present

## 2017-07-24 DIAGNOSIS — E039 Hypothyroidism, unspecified: Secondary | ICD-10-CM | POA: Diagnosis not present

## 2017-07-24 DIAGNOSIS — M109 Gout, unspecified: Secondary | ICD-10-CM

## 2017-07-24 DIAGNOSIS — K5521 Angiodysplasia of colon with hemorrhage: Secondary | ICD-10-CM | POA: Diagnosis not present

## 2017-07-24 DIAGNOSIS — E119 Type 2 diabetes mellitus without complications: Secondary | ICD-10-CM

## 2017-07-24 DIAGNOSIS — E669 Obesity, unspecified: Secondary | ICD-10-CM

## 2017-07-24 DIAGNOSIS — F411 Generalized anxiety disorder: Secondary | ICD-10-CM | POA: Insufficient documentation

## 2017-07-24 DIAGNOSIS — F1099 Alcohol use, unspecified with unspecified alcohol-induced disorder: Secondary | ICD-10-CM

## 2017-07-24 DIAGNOSIS — Z79899 Other long term (current) drug therapy: Secondary | ICD-10-CM

## 2017-07-24 DIAGNOSIS — E118 Type 2 diabetes mellitus with unspecified complications: Secondary | ICD-10-CM

## 2017-07-24 MED ORDER — FLUOXETINE HCL 20 MG PO CAPS: 60.0000 mg | ORAL_CAPSULE | Freq: Every day | ORAL | 1 refills | Status: DC

## 2017-07-24 MED FILL — FLUoxetine HCL 20 MG CAPS: 20 | 30 days supply | Qty: 90 | Fill #0

## 2017-07-24 NOTE — Assessment & Plan Note (Addendum)
Patient was evaluated for tachycardia when she was found to have a heart rate of 120 at last continuity visit 2/13. She reported that the tachycardia was asymptomatic.  She had diminished breath sounds throughout all lung fields and stable lower extremity edema without calf tenderness on exam. It was noted that she had risk factors for PE including smoking, obesity sedentary lifestyle but clinical picture was not felt to be consistent with PE. EKG showed sinus tachycardia with 1st degree AV block. CBC showed a hemoglobin of 8 which was lower than 11.3 five months prior. She was referred for an echo.  She followed up with oncology on 2/18 and was found to have a ferritin of 14 so she was given a dose of IV feraheme. She was scheduled to follow up for monthly CBC and iron studies.   Today she is here for follow up. Her heart rate is improved to 103. She says that the palpitations are not associated with chest pain, shortness of breath, dizziness or presyncope. She does not have uncontrolled pain, signs or symptoms of infection or PE. GAD 7 score today is 15. In march 2018 she was found to have a TSH 0.338. She does report symptoms of weakness and generalized muscle pains but attributes this to the anemia. She reports irritability, intermittents tremor, heat intolerance. She deneis increased diaphoresis, and weight loss.  CMET at the oncology visit showed signs of dehydration ( crt 1/13 increased from 0.9 five months prior). She drinks about one bottle of water daily and a lot of coolaid throughout the day. She does smokes about 4 cigarettes daily, drinks about 2 shots and 2 beers daily. She has quit smoking cocaine and is trying to cut back on marijuana.   Will continue evaluation for secondary causes of tachycardia including rehydration, thyroid function testing, correction of anemia and uncontrolled anxiety and encouragement of tobacco cessation today. Heart rate is improved from the time of last office visit  but if her heart rate remains uncontrolled despite correction of these factors would favor starting a beta blocker to protect her from tachycardia induced myopathy.   - Recheck TSH,  T3 and T4 today  - continue with plan for echocardiogram  - encouraged rehydration  - return to oncology for follow up CBC and ferritin as planned  - increase SSRI for uncontrolled anxiety ( see separate problem)  - continue follow up with counseling for grief

## 2017-07-24 NOTE — Assessment & Plan Note (Signed)
She has been able to cut back on smoking to about 4 cigarettes daily. She would like to quit completely. She has tried patches in the past but did not like those.  - encouraged 1800 quit line

## 2017-07-24 NOTE — Patient Instructions (Addendum)
Fasting means a blood reading after 8 hours of not having any calories to eat or drink.  Before meal means 4-6 hours after your last meal.  Fasting is usually before breakfast   The reading is ideal if it is less than 130.   After meal is 1-3 hours after eating or drinking something with calories    (like Glucerna)   An after meal blood sugar is best if it is less than 180.   Usual times to check blood sugar are before  Breakfast Lunch  Dinner  Bedtime  If you miss one do the other times for a total of 3 times a day.  Take blood sugar before drinking Glucerna.  FOOT CARE-  I will request foot doctor referral Look at your foot daily for crackers, redness, sores, blisters, rub your hands over them also After washing feet,   1-dry between toes first.  2- rub gently with soft washcloth to remove dead skin. Do not rub too  hard, do not break skin   3- apply lotion to heels and midfoot, BUT not between your toes  4- wear white socks   5- wear shoes that have enough room to fit your big toe joint  Comfortably  Take your specail shoes to the foot doctor and ask if they fit your foot well.   Please make an appointment in about 6 months (October) and always call for any questions.   [name]  [phone]

## 2017-07-24 NOTE — Progress Notes (Signed)
Diabetes Self-Management Education  Visit Type:  Follow-up(annual follow up)  Appt. Start Time: 1415 Appt. End Time: 1500  07/24/2017  [name], identified by name and date of birth, is a 67 y.o. female with a diagnosis of Diabetes:  Marland Kitchen  Type 2 diabetes  ASSESSMENT Lab Results  Component Value Date   HGBA1C 4.9 07/10/2017   Estimated body mass index is 37.75 kg/m as calculated from the following:   Height as of an earlier encounter on 07/24/17: 5\' 3"  (1.6 m).   Weight as of an earlier encounter on 07/24/17: 213 lb 1.6 oz (96.7 kg).     Diabetes Self-Management Education - 07/24/17 1500      Health Coping   How would you rate your overall health?  Good      Psychosocial Assessment   Patient Belief/Attitude about Diabetes  Motivated to manage diabetes  (Pended)     Self-care barriers  Unsteady gait/risk for falls;Lack of material resources  [employer])     Self-management support  Doctor's office;Family;CDE visits  (Pended)     Patient Concerns  Monitoring;Other (comment)  (Pended)  foot care    Special Needs  None  (Pended)     Preferred Learning Style  No preference indicated  (Pended)     Learning Readiness  Ready  (Pended)       Pre-Education Assessment   Patient understands the diabetes disease and treatment process.  Demonstrates understanding / competency  (Pended)     Patient understands incorporating nutritional management into lifestyle.  Demonstrates understanding / competency  (Pended)     Patient undertands incorporating physical activity into lifestyle.  Demonstrates understanding / competency  (Pended)     Patient understands using medications safely.  Demonstrates understanding / competency  (Pended)     Patient understands monitoring blood glucose, interpreting and using results  Needs Review  (Pended)     Patient understands prevention, detection, and treatment of acute complications.  Demonstrates understanding / competency  (Pended)     Patient  understands prevention, detection, and treatment of chronic complications.  Needs Review  (Pended)     Patient understands how to develop strategies to address psychosocial issues.  Demonstrates understanding / competency  (Pended)     Patient understands how to develop strategies to promote health/change behavior.  Demonstrates understanding / competency  (Pended)       Complications   Last HgB A1C per patient/outside source  4.9 %  (Pended)     How often do you check your blood sugar?  0 times/day (not testing)  (Pended)  meter was not working    Fasting Blood glucose range (mg/dL)  70-129;130-179  (Pended)     Postprandial Blood glucose range (mg/dL)  130-179  (Pended)     Have you had a dilated eye exam in the past 12 months?  Yes  (Pended)     Are you checking your feet?  Yes  (Pended)       Subsequent Visit   Since your last visit have you continued or begun to take your medications as prescribed?  Yes    Since your last visit have you had your blood pressure checked?  Yes    Is your most recent blood pressure lower, unchanged, or higher since your last visit?  Higher  (Pended)     Since your last visit have you experienced any weight changes?  Gain  (Pended)     Weight Gain (lbs)  25  (Pended)  Since your last visit, are you checking your blood glucose at least once a day?  No  (Pended)        Learning Objective:  Patient will have a greater understanding of diabetes self-management. Patient education plan is to attend individual and/or group sessions per assessed needs and concerns. My plan to support myself in continuing these changes to care for my diabetes is to attend or contact:   EDiabetes Support Groups  Type 2 diabetes support group : 2nd Monday of every month from 6-7 PM at [address] [facility] conference room [phone]    Or local support resources -doctor's office, CDE, Dietitian, pharmacist, church Journals ? Diabetes Forecast- [phone]- ?  Diabetes Self-Management- [phone]-  . Plan:   Patient Instructions  Fasting means a blood reading after 8 hours of not having any calories to eat or drink.  Before meal means 4-6 hours after your last meal.  Fasting is usually before breakfast   The reading is ideal if it is less than 130.   After meal is 1-3 hours after eating or drinking something with calories    (like Glucerna)   An after meal blood sugar is best if it is less than 180.   Usual times to check blood sugar are before  Breakfast Lunch  Dinner  Bedtime  If you miss one do the other times for a total of 3 times a day.  Take blood sugar before drinking Glucerna.  FOOT CARE-  I will request foot doctor referral Look at your foot daily for crackers, redness, sores, blisters, rub your hands over them also After washing feet,   1-dry between toes first.  2- rub gently with soft washcloth to remove dead skin. Do not rub too  hard, do not break skin   3- apply lotion to heels and midfoot, BUT not between your toes  4- wear white socks   5- wear shoes that have enough room to fit your big toe joint  Comfortably  Take your specail shoes to the foot doctor and ask if they fit your foot well.   Please make an appointment in about 6 months (October) and always call for any questions.   [name]  [phone]     Expected Outcomes:     Education material provided: Support group flyer  If problems or questions, patient to contact team via: phone  Future DSME appointment: -  6 months Debera Lat, RD 07/24/2017 3:35 PM.

## 2017-07-24 NOTE — Assessment & Plan Note (Signed)
Reports symptoms of generalized anxiety which are not adequately controlled by fluoxetine 40 mg daily. GAD 7 score today is 15 consistent with severe anxiety.  - increase fluoxetine to 60 mg daily

## 2017-07-24 NOTE — Patient Instructions (Addendum)
Thank you for coming to the clinic today. It was a pleasure to see you.   For your fast heart rate  We will watch out for the results of your echocardiogram Please try to drink about 8 glasses of water per day  Please try calling using the 1-800-quit line for resources on cutting back on smoking.  Please follow up with Dr. Burr Medico for the repeat of your blood counts   For your anxiety, start taking prozac to 60 mg daily   FOLLOW-UP INSTRUCTIONS When: March 27 with Dr. Jari Favre For: follow up of your fast heart rate  What to bring: all of your medication bottles   Please call our clinic if you have any questions or concerns, we may be able to help and keep you from a long and expensive emergency room wait. Our clinic and after hours phone number is [phone], there is always someone available.

## 2017-07-24 NOTE — Progress Notes (Signed)
CC: follow up of tachycardia   HPI:  [name] is a 67 y.o. with PMH T2DM, HTN, HLD, gout, MDD, chronic GI bleeding from AVMs, and cocaine use disorder who presents for follow up of tachycardia. Please see the assessment and plans for the status of the patient chronic medical problems.    Past Medical History:  Diagnosis Date  . Allergic rhinitis   . Anemia, iron deficiency 05/03/2011   2/2 GI AVMs - recieves iron infusions as well as periodic red blood cell transfusions  . Anxiety   . Arteriovenous malformation of duodenum 02/25/2015  . CARPAL TUNNEL RELEASE, HX OF 07/22/2008   Qualifier: Diagnosis of  By: Tomma Lightning MD, Claiborne Billings    . Colon polyps   . Diabetes mellitus without complication ([place])   . Difficulty sleeping    takes trazadone for sleep  . Diverticulosis   . GERD (gastroesophageal reflux disease)   . Hepatitis C    in SVR with Harvoni 12/2014-02/2015.   Marland Kitchen History of hernia repair 08/18/2012  . History of porphyria 11/30/2014   Porphyria cutanea tarda in setting of chronic Hepatitis C, frequent  IV iron infusions, and alcohol abuse   . Hypertension   . Peripheral vascular disease ([place])   . Stroke [facility]) 2010   no residual problems  . Substance use disorder    cocaine   Review of Systems:  Refer to history of present illness and assessment and plans for pertinent review of systems, all others reviewed and negative  Physical Exam:  Vitals:   07/24/17 1456  BP: (!) 157/106  Pulse: (!) 103  Temp: 98.1 F (36.7 C)  TempSrc: Oral  SpO2: 99%  Weight: 213 lb 1.6 oz (96.7 kg)  Height: 5\' 3"  (1.6 m)   General: well appearing, no acute distress  Cardiac: increased rate with regular rhythm, no murmur, no calf swelling or tenderness  Pulm: lungs clear to auscultation, no respiratory distress   Assessment & Plan:   Tachycardia  Patient was evaluated for tachycardia when she was found to have a heart rate of 120 at last continuity visit 2/13. She reported that the  tachycardia was asymptomatic.  She had diminished breath sounds throughout all lung fields and stable lower extremity edema without calf tenderness on exam. It was noted that she had risk factors for PE including smoking, obesity sedentary lifestyle but clinical picture was not felt to be consistent with PE. EKG showed sinus tachycardia with 1st degree AV block. CBC showed a hemoglobin of 8 which was lower than 11.3 five months prior. She was referred for an echo.  She followed up with oncology on 2/18 and was found to have a ferritin of 14 so she was given a dose of IV feraheme. She was scheduled to follow up for monthly CBC and iron studies.   Today she is here for follow up. Her heart rate is improved to 103. She says that the palpitations are not associated with chest pain, shortness of breath, dizziness or presyncope. She does not have uncontrolled pain, signs or symptoms of infection or PE. GAD 7 score today is 15. In march 2018 she was found to have a TSH 0.338. She does report symptoms of weakness and generalized muscle pains but attributes this to the anemia. She reports irritability, intermittents tremor, heat intolerance. She deneis increased diaphoresis, and weight loss.  CMET at the oncology visit showed signs of dehydration ( crt 1/13 increased from 0.9 five months prior). She drinks about  one bottle of water daily and a lot of coolaid throughout the day. She does smokes about 4 cigarettes daily, drinks about 2 shots and 2 beers daily. She has quit smoking cocaine and is trying to cut back on marijuana.   Will continue evaluation for secondary causes of tachycardia including rehydration, thyroid function testing, correction of anemia and uncontrolled anxiety and encouragement of tobacco cessation today. Heart rate is improved from the time of last office visit but if her heart rate remains uncontrolled despite correction of these factors would favor starting a beta blocker to protect her from  tachycardia induced myopathy.   - Recheck TSH,  T3 and T4 today  - continue with plan for echocardiogram  - encouraged rehydration  - return to oncology for follow up CBC and ferritin as planned  - increase SSRI for uncontrolled anxiety ( see separate problem)  - continue follow up with counseling for grief   Generalized anxiety disorder  Reports symptoms of generalized anxiety which are not adequately controlled by fluoxetine 40 mg daily. GAD 7 score today is 15 consistent with severe anxiety.  - increase fluoxetine to 60 mg daily   Tobacco use  She has been able to cut back on smoking to about 4 cigarettes daily. She would like to quit completely. She has tried patches in the past but did not like those.  - encouraged 1800 quit line   See Encounters Tab for problem based charting.  Patient discussed with Dr. Beryle Beams

## 2017-07-25 ENCOUNTER — Ambulatory Visit: Payer: Medicare Other

## 2017-07-25 LAB — TSH: TSH: 0.948 u[IU]/mL (ref 0.450–4.500)

## 2017-07-25 LAB — T4, FREE: Free T4: 1.06 ng/dL (ref 0.82–1.77)

## 2017-07-25 NOTE — Progress Notes (Signed)
Medicine attending: Medical history, presenting problems, physical findings, and medications, reviewed with resident physician Dr Nina Blum on the day of the patient visit and I concur with her evaluation and management plan. 

## 2017-07-31 ENCOUNTER — Encounter: Payer: Self-pay | Admitting: *Deleted

## 2017-07-31 ENCOUNTER — Ambulatory Visit (HOSPITAL_COMMUNITY)
Admission: RE | Admit: 2017-07-31 | Discharge: 2017-07-31 | Disposition: A | Discharge status: Home / Self Care | Payer: Medicare Other | Source: Ambulatory Visit | Attending: Internal Medicine | Admitting: Internal Medicine

## 2017-07-31 DIAGNOSIS — R000 Tachycardia, unspecified: Secondary | ICD-10-CM | POA: Insufficient documentation

## 2017-07-31 DIAGNOSIS — Z8673 Personal history of transient ischemic attack (TIA), and cerebral infarction without residual deficits: Secondary | ICD-10-CM | POA: Diagnosis not present

## 2017-07-31 DIAGNOSIS — E119 Type 2 diabetes mellitus without complications: Secondary | ICD-10-CM | POA: Diagnosis not present

## 2017-07-31 DIAGNOSIS — I10 Essential (primary) hypertension: Secondary | ICD-10-CM | POA: Diagnosis not present

## 2017-07-31 DIAGNOSIS — I082 Rheumatic disorders of both aortic and tricuspid valves: Secondary | ICD-10-CM | POA: Diagnosis not present

## 2017-07-31 NOTE — Progress Notes (Signed)
  Echocardiogram 2D Echocardiogram has been performed.  Quency Tober T Emoree Sasaki 07/31/2017, 4:18 PM

## 2017-08-06 DIAGNOSIS — G933 Postviral fatigue syndrome: Secondary | ICD-10-CM | POA: Diagnosis not present

## 2017-08-12 ENCOUNTER — Inpatient Hospital Stay: Payer: Medicare Other | Attending: Nurse Practitioner

## 2017-08-12 DIAGNOSIS — D500 Iron deficiency anemia secondary to blood loss (chronic): Secondary | ICD-10-CM | POA: Diagnosis not present

## 2017-08-12 DIAGNOSIS — Z79899 Other long term (current) drug therapy: Secondary | ICD-10-CM | POA: Diagnosis not present

## 2017-08-12 LAB — CBC WITH DIFFERENTIAL/PLATELET
BASOS PCT: 1 %
Basophils Absolute: 0.1 10*3/uL (ref 0.0–0.1)
EOS ABS: 0.2 10*3/uL (ref 0.0–0.5)
Eosinophils Relative: 2 %
HEMATOCRIT: 26.8 % — AB (ref 34.8–46.6)
HEMOGLOBIN: 8.3 g/dL — AB (ref 11.6–15.9)
Lymphocytes Relative: 19 %
Lymphs Abs: 1.4 10*3/uL (ref 0.9–3.3)
MCH: 29.5 pg (ref 25.1–34.0)
MCHC: 30.9 g/dL — ABNORMAL LOW (ref 31.5–36.0)
MCV: 95.3 fL (ref 79.5–101.0)
Monocytes Absolute: 0.7 10*3/uL (ref 0.1–0.9)
Monocytes Relative: 11 %
NEUTROS ABS: 4.7 10*3/uL (ref 1.5–6.5)
NEUTROS PCT: 67 %
Platelets: 356 10*3/uL (ref 145–400)
RBC: 2.81 MIL/uL — AB (ref 3.70–5.45)
RDW: 19.5 % — ABNORMAL HIGH (ref 11.2–14.5)
WBC: 7.1 10*3/uL (ref 3.9–10.3)

## 2017-08-13 LAB — FERRITIN: Ferritin: 32 ng/mL (ref 9–269)

## 2017-08-15 ENCOUNTER — Telehealth: Payer: Self-pay | Admitting: *Deleted

## 2017-08-15 NOTE — Telephone Encounter (Signed)
TCT patient regarding labs from 08/12/17. Spoke with patient. Informed her that her iron levels were low and that we would be scheduling her for an additional IV iron in the next week.  Pt verbalized understanding and stated she had a feeling that her iron was low as she states her fingers were numb and cold feeling.  Also informed her that we would set up an appointment for repeat labs and an appt with Dr. Burr Medico in 2-3 weeks as well. She voiced understanding.   She requests at least a 3 day leeway so she can schedule her transportation.  High priority schedule request sent d/t transportation concerns

## 2017-08-19 ENCOUNTER — Telehealth: Payer: Self-pay | Admitting: Hematology

## 2017-08-19 NOTE — Telephone Encounter (Signed)
Returned call to patient confirming appointment for 3/28 per phone msg 3/25

## 2017-08-19 NOTE — Telephone Encounter (Signed)
Scheduled appt per 3/21 sch msg - spoke with patient and confirmed appts.

## 2017-08-22 ENCOUNTER — Inpatient Hospital Stay: Payer: Medicare Other

## 2017-08-22 VITALS — BP 136/70 | HR 102 | Temp 98.0°F | Resp 16

## 2017-08-22 DIAGNOSIS — D500 Iron deficiency anemia secondary to blood loss (chronic): Secondary | ICD-10-CM

## 2017-08-22 DIAGNOSIS — K922 Gastrointestinal hemorrhage, unspecified: Secondary | ICD-10-CM

## 2017-08-22 DIAGNOSIS — Z79899 Other long term (current) drug therapy: Secondary | ICD-10-CM | POA: Diagnosis not present

## 2017-08-22 MED ORDER — DIPHENHYDRAMINE HCL 50 MG/ML IJ SOLN
50.0000 mg | Freq: Once | INTRAMUSCULAR | Status: AC
  Administered 2017-08-22: 50 mg via INTRAVENOUS

## 2017-08-22 MED ORDER — FAMOTIDINE IN NACL 20-0.9 MG/50ML-% IV SOLN
20.0000 mg | Freq: Once | INTRAVENOUS | Status: AC
  Administered 2017-08-22: 20 mg via INTRAVENOUS

## 2017-08-22 MED ORDER — SODIUM CHLORIDE 0.9 % IV SOLN
510.0000 mg | Freq: Once | INTRAVENOUS | Status: AC
  Administered 2017-08-22: 510 mg via INTRAVENOUS
  Filled 2017-08-22: qty 17

## 2017-08-22 MED ORDER — DIPHENHYDRAMINE HCL 50 MG/ML IJ SOLN
INTRAMUSCULAR | Status: AC
  Filled 2017-08-22: qty 1

## 2017-08-22 MED ORDER — FAMOTIDINE IN NACL 20-0.9 MG/50ML-% IV SOLN
INTRAVENOUS | Status: AC
  Filled 2017-08-22: qty 50

## 2017-08-22 MED FILL — LANTUS SOLOSTAR 100 UNITS/M: 100 | 60 days supply | Qty: 15 | Fill #1

## 2017-08-22 NOTE — Patient Instructions (Signed)

## 2017-08-28 NOTE — Progress Notes (Signed)
[facility] HEMATOLOGY OFFICE PROGRESS NOTE  Date of Service:  08/29/2017   Patricia Grieve, MD [place] [address]  DIAGNOSIS: Iron deficiency anemia due to chronic blood loss - Plan: Occult blood card to lab, stool, Occult blood card to lab, stool, Occult blood card to lab, stool  Gastrointestinal hemorrhage, unspecified gastrointestinal hemorrhage type.   PROBLEM LIST:  1. Recurrent iron-deficiency anemia secondary to gastrointestinal bleeding (as noted above). [name] also has required red cell transfusions in the past, most recently early December 2012. She underwent a small bowel enteroscopy by Dr. Dan Humphreys at [facility] on 08/13/2011. The enteroscope was passed up to 6 feet into the jejunum. He saw multiple AVMs, which were ablated. Two of the large AVMs bled during APC. These were in the 4th part of the duodenum very close to the ligament of Treitz. The remainder of the AVMs were in the proximal jejunum. In addition to the small bowel AV malformations, which were treated with APC, he saw some small esophageal varices which were not bleeding and also evidence of mild portal hypertensive gastropathy.  2. History of duodenal arteriovenous malformation.  3. History of hepatitis C infection.  4. Gastroesophageal reflux disease.  5. Depression.  6. Hypertension.  7. History of right Bell's palsy.  8. Status post left above-knee amputation secondary to necrotizing fasciitis 03/06/2009.  9. Neurodermatitis.  10. Previous history of alcohol usage.  11. Admission to the hospital from 12/20/2011 through 12/25/2011 for hematochezia felt to be secondary to a diverticular bleed, requiring 2 units of packed red cells.  12. Diverticulosis.  13. Elevated alpha fetoprotein, 72.3 on 11/19/2011 and 76.0 on 01/11/2012.  14. Abnormal MRI of the liver from 11/28/2011 showing diffuse and markedly low signal intensity throughout the  liver as well as diffuse low signal intensity in the adrenal glands. 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 There were no findings for cirrhosis, portal hypertension, splenomegaly or ascites, not were there any worrisome enhancing liver lesions.  15. Systolic ejection murmur.   CHIEF COMPLAIN: Follow up anemia  PRIOR TREATMENT: The patient has been heavily dependent upon IV Feraheme. Over the past year or so she has been receiving about 1 Feraheme infusion monthly on average. The patient's most recent infusions of IV Feraheme 510 mg occurred on the following dates: 06/05/2011, 07/03/2011, 07/31/2011, 08/17/2011, 08/28/2011, 10/02/2011 01/30/2012, 06/06/2012 and 09/10/2012, 05/2013, 07/2013, 08/2013, 09/2013, 11/12/2013, [01/15/2014 and 03/16/2014 at 10230mg ], 09/16/2014 and 09/23/2014, 11/2014 , 02/24/2015,  04/07/2015, 08/03/2015, 10/03/2015, 12/16/2015, 01/2016 and 02/27/2016, 09/07/16, 01/31/17, 07/15/17, 08/22/17  CURRENT TREATMENT: Feraheme prn   INTERVAL HISTORY:   [name] 67 y.o. female with history of recurrent iron-deficiency anemia due to GI bleeding is here for follow-up. She was last seen by me 7 months ago. In interim she was seen by NP Ned Card at our clinic.  The patient presents to the clinic today noting she will check her Hep C this month to monitor for recurrence. She notes she is still moderately tired as she did not have complete response from IV Feraheme. She has not seen Dr. Linus Salmons her ID doctor or a GI in 2+ years.  On review of symptoms, pt notes her stool is black and smells like blood but does not look like blood. She notes she is taking oral iron so this has been going on for some time now.    MEDICAL HISTORY: Past Medical History:  Diagnosis Date  . Allergic rhinitis   . Anemia, iron deficiency 05/03/2011   2/2 GI AVMs - recieves iron infusions as well as periodic red blood cell transfusions  . Anxiety   . Arteriovenous malformation  of duodenum 02/25/2015  . CARPAL TUNNEL RELEASE, HX OF 07/22/2008   Qualifier: Diagnosis of  By: Tomma Lightning MD, Claiborne Billings    . Colon polyps   . Diabetes mellitus without complication ([place])   . Difficulty sleeping    takes trazadone for sleep  . Diverticulosis   . GERD (gastroesophageal reflux disease)   . Hepatitis C    in SVR with Harvoni 12/2014-02/2015.   Marland Kitchen History of hernia repair 08/18/2012  . History of porphyria 11/30/2014   Porphyria cutanea tarda in setting of chronic Hepatitis C, frequent  IV iron infusions, and alcohol abuse   . Hypertension   . Peripheral vascular disease ([place])   . Stroke [facility]) 2010   no residual problems  . Substance use disorder    cocaine     ALLERGIES:  is allergic to ciprofloxacin; morphine and related; penicillins; codeine; lisinopril; and feraheme [ferumoxytol].  MEDICATIONS: has a current medication list which includes the following prescription(s): aspirin ec, atenolol, cetirizine, cholecalciferol, colchicine, fluoxetine, gabapentin, hydrochlorothiazide, losartan, omeprazole, potassium chloride, trazodone, vitamin c, ferrous sulfate, and tramadol.  SURGICAL HISTORY:  Past Surgical History:  Procedure Laterality Date  . ABDOMINAL HYSTERECTOMY    . APPLICATION OF WOUND VAC N/A 06/21/2014   Procedure: APPLICATION OF WOUND VAC;  Surgeon: Coralie Keens, MD;  Location: [facility];  Service: General;  Laterality: N/A;  . CARPAL TUNNEL RELEASE     rt hand  . ENTEROSCOPY N/A 12/04/2012   Procedure: ENTEROSCOPY;  Surgeon: Beryle Beams, MD;  Location: [facility];  Service: Endoscopy;  Laterality: N/A;  . ENTEROSCOPY N/A 12/18/2012   Procedure: ENTEROSCOPY;  Surgeon: Beryle Beams, MD;  Location[facility];  Service: Endoscopy;  Laterality: N/A;  . ENTEROSCOPY N/A 02/25/2015   Procedure: ENTEROSCOPY;  Surgeon: Inda Castle, MD;  Location: [facility] ENDOSCOPY;  Service: Endoscopy;  Laterality: N/A;  . ESOPHAGOGASTRODUODENOSCOPY  05/05/2011   Procedure:  ESOPHAGOGASTRODUODENOSCOPY (EGD);  Surgeon: Zenovia Jarred, MD;  Location: [facility];  Service: Gastroenterology;  Laterality: N/A;  Dr. Hilarie Fredrickson will do procedure for Dr. Benson Norway Saturday.  . ESOPHAGOGASTRODUODENOSCOPY  05/08/2011   Procedure: ESOPHAGOGASTRODUODENOSCOPY (EGD);  Surgeon: Beryle Beams;  [facility];  Service: Endoscopy;  Laterality: N/A;  . ESOPHAGOGASTRODUODENOSCOPY  06/07/2011   Procedure: ESOPHAGOGASTRODUODENOSCOPY (EGD);  Surgeon: Beryle Beams, MD;  Location: [facility];  Service: Endoscopy;  Laterality: N/A;  . ESOPHAGOGASTRODUODENOSCOPY  12/20/2011   Procedure: ESOPHAGOGASTRODUODENOSCOPY (EGD);  Surgeon: Beryle Beams, MD;  Location: [facility];  Service: Endoscopy;  Laterality: N/A;  . EYE SURGERY Bilateral    cataracts  . FLEXIBLE SIGMOIDOSCOPY  12/21/2011   Procedure: FLEXIBLE SIGMOIDOSCOPY;  Surgeon: Beryle Beams, MD;  [facility];  Service: Endoscopy;  Laterality: N/A;  . HOT HEMOSTASIS  06/07/2011   Procedure: HOT HEMOSTASIS (ARGON PLASMA COAGULATION/BICAP);  Surgeon: Beryle Beams, MD;  Location: [facility];  Service: Endoscopy;  Laterality: N/A;  . INCISIONAL HERNIA REPAIR N/A 06/01/2014   Procedure: ATTEMPTED LAPAROSCOPIC AND OPEN INCISIONAL HERNIA REPAIR WITH MESH;  Surgeon: Coralie Keens, MD;  Location: [place];  Service: General;  Laterality: N/A;  . INSERTION OF MESH N/A 06/01/2014   Procedure: INSERTION OF MESH;  Surgeon: Coralie Keens, MD;  Location: [facility];  Service: General;  Laterality: N/A;  .  LAPAROTOMY  02/16/2012   Procedure: EXPLORATORY LAPAROTOMY;  Surgeon: Edward Jolly, MD;  Location[facility];  Service: General;  Laterality: N/A;  oversewing of anastomotic leak and rigid sigmoidoscopy  . LAPAROTOMY N/A 06/21/2014   Procedure: ABDOMINAL WOUND EXPLORATION;  Surgeon: Coralie Keens, MD;  Location: [facility];  Service: General;  Laterality: N/A;  . LEG AMPUTATION ABOVE KNEE    . PARTIAL COLECTOMY  02/15/2012   Procedure:  PARTIAL COLECTOMY;  Surgeon: Harl Bowie, MD;  Location[facility];  Service: General;  Laterality: N/A;  . TONSILLECTOMY     Allergies as of 08/29/2017      Reactions   Ciprofloxacin Hives, Swelling   Morphine And Related Other (See Comments)   Makes pt sad and paranoid   Penicillins Hives, Swelling   Has patient had a PCN reaction causing immediate rash, facial/tongue/throat swelling, SOB or lightheadedness with hypotension:YES Has patient had a PCN reaction causing severe rash involving mucus membranes or skin necrosis:UNSURE Has patient had a PCN reaction that required hospitalization:YES Has patient had a PCN reaction occurring within the last 10 years:No If all of the above answers are "NO", then may proceed with Cephalosporin use.   Codeine Hives, Swelling   Lisinopril Cough   New onset dry cough after starting lisinopril. Resolved upon switching to losartan.   Feraheme [ferumoxytol] Itching   Tolerated Feraheme 6/26 & 7/21 pre-medications prior to medication      Medication List        Accurate as of 08/29/17  5:32 PM. Always use your most recent med list.          ACCU-CHEK FASTCLIX LANCETS Misc Use to test blood glucose 3 times daily.   allopurinol 300 MG tablet Commonly known as:  ZYLOPRIM Take 1 tablet (300 mg total) by mouth daily.   aspirin EC 81 MG tablet Take 1 tablet (81 mg total) by mouth daily.   cetirizine 10 MG tablet Commonly known as:  ZYRTEC TAKE 1 TABLET BY MOUTH DAILY   cholecalciferol 1000 units tablet Commonly known as:  VITAMIN D Take 1 tablet (1,000 Units total) by mouth daily.   colchicine 0.6 MG tablet TAKE ONE TABLET BY MOUTH DAILY   diclofenac sodium 1 % Gel Commonly known as:  VOLTAREN Apply 4 g topically 4 (four) times daily as needed.   DILT-XR 180 MG 24 hr capsule Generic drug:  diltiazem TAKE 1 CAPSULE BY MOUTH ONCE DAILY   feeding supplement (GLUCERNA SHAKE) Liqd Take 237 mLs by mouth 3 (three) times daily between  meals.   ferrous sulfate 325 (65 FE) MG EC tablet Take 1 tablet (325 mg total) by mouth daily after breakfast.   FLUoxetine 20 MG capsule Commonly known as:  PROZAC Take 3 capsules (60 mg total) by mouth daily.   gabapentin 400 MG capsule Commonly known as:  NEURONTIN TAKE 2 CAPSULES BY MOUTH THREE TIMES A DAY   glucose blood test strip Commonly known as:  ACCU-CHEK GUIDE Check blood sugar 3 times a day   Insulin Glargine 100 UNIT/ML Solostar Pen Commonly known as:  LANTUS Inject 10 Units into the skin daily at 10 pm.   Insulin Pen Needle 32G X 4 MM Misc Commonly known as:  UNIFINE PENTIPS THE PATIENT IS INSULIN REQUIRING. USE TO INJECT INSULIN ONCE PER DAY. Diagnosis code E11.9   losartan 100 MG tablet Commonly known as:  COZAAR TAKE 1 TABLET BY MOUTH EVERY DAY   metFORMIN 1000 MG tablet Commonly known as:  GLUCOPHAGE Take 1 tablet (1,000 mg total) by mouth 2 (two) times daily with a meal.   omeprazole 20 MG capsule Commonly known as:  PRILOSEC TAKE 1 CAPSULE BY MOUTH EVERY DAY **STOP NEXIUM**   Potassium Chloride ER 20 MEQ Tbcr Take 20 mEq by mouth daily.   traZODone 50 MG tablet Commonly known as:  DESYREL TAKE ONE TABLET BY MOUTH AT BEDTIME   vitamin C 500 MG tablet Commonly known as:  ASCORBIC ACID Take 500 mg by mouth every morning.      REVIEW OF SYSTEMS:   Constitutional: Denies fevers, chills or abnormal weight loss;  (+) moderate fatigue  Eyes: Denies blurriness of vision Ears, nose, mouth, throat, and face: Denies mucositis or sore throat Respiratory: Denies cough, dyspnea or wheezes Cardiovascular: Denies palpitation, chest discomfort   Gastrointestinal:  Denies nausea, heartburn or change in bowel habits Skin: Negative Lymphatics: Denies new lymphadenopathy or easy bruising Neurological:Denies numbness, tingling or new weaknesses Behavioral/Psych: Mood is stable, no new changes  All other systems were reviewed with the patient and are  negative.  PHYSICAL EXAMINATION:  ECOG PERFORMANCE STATUS: 2-3 BP 111/66 (BP Location: Right Arm, Patient Position: Sitting)   Pulse (!) 115 Comment: Thu RN is aware  Temp 98.7 F (37.1 C) (Oral)   Resp 18   Ht 5\' 3"  (1.6 m)   Wt 171 lb 8 oz (77.8 kg)   SpO2 96%   BMI 30.38 kg/m    General appearance: alert, appears stated age, no distress and moderately obese  Skin: she has multiple skin pigmentations from previous rash on her arms and legs.  Head: Normocephalic, without obvious abnormality, atraumatic  Neck: no adenopathy, supple, symmetrical, trachea midline and thyroid not enlarged, symmetric, no tenderness/mass/nodules  Lymph nodes: Cervical adenopathy: None appreciated  Heart:regular rate and rhythm, S1, S2 normal and systolic murmur 2/6 Lung:chest clear, no wheezing, rales, normal symmetric air entry, Heart exam - S1, S2 normal, no murmur, no gallop, rate regular  Abdomen: soft, distended, normal bowel sounds, nontender EXT: No peripheral edema on right extremity; Left above the knee amputation. She is wearing a prothesis.   Labs:  CBC Latest Ref Rng & Units 08/29/2017 08/12/2017 07/15/2017  WBC 3.9 - 10.3 K/uL 8.8 7.1 5.8  Hemoglobin 11.6 - 15.9 g/dL 9.2(L) 8.3(L) 8.4(L)  Hematocrit 34.8 - 46.6 % 30.8(L) 26.8(L) 29.6(L)  Platelets 145 - 400 K/uL 386 356 358    CMP Latest Ref Rng & Units 07/15/2017 01/31/2017 09/25/2016  Glucose 70 - 140 mg/dL 120 104 132(H)  BUN 7 - 26 mg/dL 29(H) 14.0 14  Creatinine 0.60 - 1.10 mg/dL 1.13(H) 0.9 0.75  Sodium 136 - 145 mmol/L 138 141 143  Potassium 3.5 - 5.1 mmol/L 4.7 4.4 3.7  Chloride 98 - 109 mmol/L 107 - 104  CO2 22 - 29 mmol/L 23 22 22   Calcium 8.4 - 10.4 mg/dL 9.4 9.0 7.7(L)  Total Protein 6.4 - 8.3 g/dL 7.0 7.1 -  Total Bilirubin 0.2 - 1.2 mg/dL <0.2(L) 0.26 -  Alkaline Phos 40 - 150 U/L 66 71 -  AST 5 - 34 U/L 39(H) 25 -  ALT 0 - 55 U/L 30 21 -     Results for ISOBELLA, ASCHER (MRN [id number]) as of 08/28/2017 15:58  Ref. Range  11/29/2016 14:27 01/31/2017 14:16 07/15/2017 13:38 08/12/2017 14:49  Ferritin Latest Ref Range: 9 - 269 ng/mL 29 21 14  32     RADIOGRAPHIC STUDIES: Mammogram Digital Screening Bilateral 09/21/15 IMPRESSION: No mammographic evidence  of malignancy. A result letter of this screening mammogram will be mailed directly to the patient.  ASSESSMENT: [name] 67 y.o. female with a history of   1. Iron deficiency anemia secondary to GI loss, with aspects of Anemia of Chronic Disease  -History of multiple GI bleeding in 2016, required blood transfusion, hospitalization and IV Feraheme -She previously required blood transfusion periodically, much less lately  -She has been receiving IV Feraheme every 2 months on average lately -I previously recommended close follow-up, we'll check her CBC and ferritin every 4 weeks -Due to her PCT, we'll be cautious with IV iron. Her PCT has resolved due to her successfully treated hepatitis C, I'll be okay to be more liberal to replace her iron deficiency. -She had IV Feraheme over the last 2 months. Her response was moderate. Hg today is 9.2  -She has been on oral iron for 5-6 years now and has had black watery stool.  -Given lack of response to IV Feraheme she will do stool card test today. If positive for blood I recommend colonoscopy with [facility] to find cause.  -Continue to check her labs every 3 weeks.  -F/u in 3 months   2. Porphyria cutaneous tarda (PCT), resolved -per pt, she had significant sun sensitivity which caused skin blisters on her arms and legs, she has multiple skin pigmentations from healed skin rash on her arms. -Both her urine and serum porphyria, especially uroporphyrin and coproporphyrin all elevated.  -This is likely secondary to her hepatitis C -She has previously completed treatment for hepatitis C. Her PCT has resolved now   3. chronic hepatitis C infection -She previously tested positive for hepatitis C in 2009. She has  previously completed treatment for hepatitis C in 2016 and repeated test was negative.  -liver function has previously improved -She was previously seen by GI Dr. Deatra Ina who has since left the practice. She was previously given GI [place] clinic's information to be seen.  -She is due for Liver US, and AFP for liver cancer screening. Will send message to Dr. Jari Favre about this.  -Due to her chronic anemia, worse lately, I would suggest repeating hepatitis C to rule out HCV flare   4. DM2 --Management per PCP's office.  -Glucose was previously 341. I previously encouraged her to restart her metformin.  -Was hospitalized in March 2018 for high glucose levels greater than 1000.  -Continue to follow up closely with PCP  5. Left BKA  -She has a new prosthesis, she is wheelchair bound most of time, I previously encouraged her to continue physical therapy  6. Smoking cessation  -She is still actively smoking, not ready to quit   7. Right knee pain and leg swelling  -likely secondary to osteoarthritis. She will follow up with PCP  -I previously encouraged her to use compression stock for edema, she will think about it   Plan  -Send note to PCP about  Repeating HCV and liver cancer screening.  -Provided Pt with Stool card test today, if positive, will refer her back to [facility]   -Lab every 3 weeks x4  -F/u in 3 months    All questions were answered. The patient knows to call the clinic with any problems, questions or concerns. We can certainly see the patient much sooner if necessary.  I spent 20 minutes counseling the patient face to face. The total time spent in the appointment was 25 minutes.  This document serves as a record of services  personally performed by Truitt Merle, MD. It was created on her behalf by Joslyn Devon, a trained medical scribe. The creation of this record is based on the scribe's personal observations and the provider's statements to them.   I have reviewed the above  documentation for accuracy and completeness, and I agree with the above.   Truitt Merle 08/29/2017 5:32 PM

## 2017-08-29 ENCOUNTER — Telehealth: Payer: Self-pay | Admitting: Hematology

## 2017-08-29 ENCOUNTER — Inpatient Hospital Stay: Payer: Medicare Other

## 2017-08-29 ENCOUNTER — Inpatient Hospital Stay: Payer: Medicare Other | Attending: Nurse Practitioner | Admitting: Hematology

## 2017-08-29 ENCOUNTER — Encounter: Payer: Self-pay | Admitting: Hematology

## 2017-08-29 VITALS — BP 111/66 | HR 115 | Temp 98.7°F | Resp 18 | Ht 63.0 in | Wt 171.5 lb

## 2017-08-29 DIAGNOSIS — G510 Bell's palsy: Secondary | ICD-10-CM | POA: Insufficient documentation

## 2017-08-29 DIAGNOSIS — F419 Anxiety disorder, unspecified: Secondary | ICD-10-CM | POA: Diagnosis not present

## 2017-08-29 DIAGNOSIS — E119 Type 2 diabetes mellitus without complications: Secondary | ICD-10-CM | POA: Diagnosis not present

## 2017-08-29 DIAGNOSIS — F329 Major depressive disorder, single episode, unspecified: Secondary | ICD-10-CM | POA: Diagnosis not present

## 2017-08-29 DIAGNOSIS — Z794 Long term (current) use of insulin: Secondary | ICD-10-CM | POA: Diagnosis not present

## 2017-08-29 DIAGNOSIS — Z8719 Personal history of other diseases of the digestive system: Secondary | ICD-10-CM | POA: Diagnosis not present

## 2017-08-29 DIAGNOSIS — K922 Gastrointestinal hemorrhage, unspecified: Secondary | ICD-10-CM

## 2017-08-29 DIAGNOSIS — M25561 Pain in right knee: Secondary | ICD-10-CM | POA: Diagnosis not present

## 2017-08-29 DIAGNOSIS — D500 Iron deficiency anemia secondary to blood loss (chronic): Secondary | ICD-10-CM | POA: Diagnosis not present

## 2017-08-29 DIAGNOSIS — K31819 Angiodysplasia of stomach and duodenum without bleeding: Secondary | ICD-10-CM

## 2017-08-29 DIAGNOSIS — F17210 Nicotine dependence, cigarettes, uncomplicated: Secondary | ICD-10-CM

## 2017-08-29 DIAGNOSIS — I739 Peripheral vascular disease, unspecified: Secondary | ICD-10-CM | POA: Insufficient documentation

## 2017-08-29 DIAGNOSIS — Z89512 Acquired absence of left leg below knee: Secondary | ICD-10-CM | POA: Diagnosis not present

## 2017-08-29 DIAGNOSIS — M7989 Other specified soft tissue disorders: Secondary | ICD-10-CM | POA: Diagnosis not present

## 2017-08-29 DIAGNOSIS — K219 Gastro-esophageal reflux disease without esophagitis: Secondary | ICD-10-CM | POA: Diagnosis not present

## 2017-08-29 DIAGNOSIS — I10 Essential (primary) hypertension: Secondary | ICD-10-CM | POA: Insufficient documentation

## 2017-08-29 DIAGNOSIS — Z7982 Long term (current) use of aspirin: Secondary | ICD-10-CM | POA: Diagnosis not present

## 2017-08-29 DIAGNOSIS — Z8673 Personal history of transient ischemic attack (TIA), and cerebral infarction without residual deficits: Secondary | ICD-10-CM | POA: Diagnosis not present

## 2017-08-29 LAB — CBC WITH DIFFERENTIAL/PLATELET
BASOS ABS: 0 10*3/uL (ref 0.0–0.1)
Basophils Relative: 1 %
EOS ABS: 0.2 10*3/uL (ref 0.0–0.5)
EOS PCT: 2 %
HCT: 30.8 % — ABNORMAL LOW (ref 34.8–46.6)
HEMOGLOBIN: 9.2 g/dL — AB (ref 11.6–15.9)
LYMPHS ABS: 2 10*3/uL (ref 0.9–3.3)
LYMPHS PCT: 23 %
MCH: 29.7 pg (ref 25.1–34.0)
MCHC: 29.9 g/dL — ABNORMAL LOW (ref 31.5–36.0)
MCV: 99.4 fL (ref 79.5–101.0)
Monocytes Absolute: 1.1 10*3/uL — ABNORMAL HIGH (ref 0.1–0.9)
Monocytes Relative: 13 %
NEUTROS PCT: 61 %
Neutro Abs: 5.5 10*3/uL (ref 1.5–6.5)
PLATELETS: 386 10*3/uL (ref 145–400)
RBC: 3.1 MIL/uL — AB (ref 3.70–5.45)
RDW: 19.4 % — ABNORMAL HIGH (ref 11.2–14.5)
WBC: 8.8 10*3/uL (ref 3.9–10.3)

## 2017-08-29 NOTE — Telephone Encounter (Signed)
Scheduled appt per 4/4 los - Gave patient AVS and calender per los.  

## 2017-08-30 DIAGNOSIS — G933 Postviral fatigue syndrome: Secondary | ICD-10-CM | POA: Diagnosis not present

## 2017-09-05 ENCOUNTER — Other Ambulatory Visit: Payer: Self-pay | Admitting: Internal Medicine

## 2017-09-05 DIAGNOSIS — I10 Essential (primary) hypertension: Secondary | ICD-10-CM

## 2017-09-06 NOTE — Telephone Encounter (Signed)
Next appt scheduled 5/8 with PCP. 

## 2017-09-09 ENCOUNTER — Other Ambulatory Visit: Payer: Self-pay

## 2017-09-09 ENCOUNTER — Other Ambulatory Visit: Payer: Self-pay | Admitting: Hematology

## 2017-09-09 DIAGNOSIS — D500 Iron deficiency anemia secondary to blood loss (chronic): Secondary | ICD-10-CM

## 2017-09-11 ENCOUNTER — Encounter: Payer: Medicare Other | Admitting: Internal Medicine

## 2017-09-12 ENCOUNTER — Other Ambulatory Visit: Payer: Medicare Other

## 2017-09-18 ENCOUNTER — Inpatient Hospital Stay: Payer: Medicare Other

## 2017-09-18 DIAGNOSIS — I739 Peripheral vascular disease, unspecified: Secondary | ICD-10-CM | POA: Diagnosis not present

## 2017-09-18 DIAGNOSIS — I10 Essential (primary) hypertension: Secondary | ICD-10-CM | POA: Diagnosis not present

## 2017-09-18 DIAGNOSIS — K31819 Angiodysplasia of stomach and duodenum without bleeding: Secondary | ICD-10-CM | POA: Diagnosis not present

## 2017-09-18 DIAGNOSIS — D500 Iron deficiency anemia secondary to blood loss (chronic): Secondary | ICD-10-CM | POA: Diagnosis not present

## 2017-09-18 DIAGNOSIS — Z8719 Personal history of other diseases of the digestive system: Secondary | ICD-10-CM | POA: Diagnosis not present

## 2017-09-18 DIAGNOSIS — M7989 Other specified soft tissue disorders: Secondary | ICD-10-CM | POA: Diagnosis not present

## 2017-09-18 DIAGNOSIS — K922 Gastrointestinal hemorrhage, unspecified: Secondary | ICD-10-CM | POA: Diagnosis not present

## 2017-09-18 DIAGNOSIS — K219 Gastro-esophageal reflux disease without esophagitis: Secondary | ICD-10-CM | POA: Diagnosis not present

## 2017-09-18 DIAGNOSIS — M25561 Pain in right knee: Secondary | ICD-10-CM | POA: Diagnosis not present

## 2017-09-18 DIAGNOSIS — Z794 Long term (current) use of insulin: Secondary | ICD-10-CM | POA: Diagnosis not present

## 2017-09-18 DIAGNOSIS — Z8673 Personal history of transient ischemic attack (TIA), and cerebral infarction without residual deficits: Secondary | ICD-10-CM | POA: Diagnosis not present

## 2017-09-18 DIAGNOSIS — Z89512 Acquired absence of left leg below knee: Secondary | ICD-10-CM | POA: Diagnosis not present

## 2017-09-18 DIAGNOSIS — G510 Bell's palsy: Secondary | ICD-10-CM | POA: Diagnosis not present

## 2017-09-18 DIAGNOSIS — Z7982 Long term (current) use of aspirin: Secondary | ICD-10-CM | POA: Diagnosis not present

## 2017-09-18 DIAGNOSIS — E119 Type 2 diabetes mellitus without complications: Secondary | ICD-10-CM | POA: Diagnosis not present

## 2017-09-18 LAB — CBC WITH DIFFERENTIAL/PLATELET
BASOS ABS: 0 10*3/uL (ref 0.0–0.1)
BASOS PCT: 1 %
EOS ABS: 0.2 10*3/uL (ref 0.0–0.5)
EOS PCT: 4 %
HCT: 29.3 % — ABNORMAL LOW (ref 34.8–46.6)
Hemoglobin: 9.2 g/dL — ABNORMAL LOW (ref 11.6–15.9)
Lymphocytes Relative: 18 %
Lymphs Abs: 1.1 10*3/uL (ref 0.9–3.3)
MCH: 30 pg (ref 25.1–34.0)
MCHC: 31.5 g/dL (ref 31.5–36.0)
MCV: 95.3 fL (ref 79.5–101.0)
MONO ABS: 0.6 10*3/uL (ref 0.1–0.9)
Monocytes Relative: 11 %
Neutro Abs: 4 10*3/uL (ref 1.5–6.5)
Neutrophils Relative %: 66 %
PLATELETS: 313 10*3/uL (ref 145–400)
RBC: 3.07 MIL/uL — ABNORMAL LOW (ref 3.70–5.45)
RDW: 19.1 % — AB (ref 11.2–14.5)
WBC: 6.1 10*3/uL (ref 3.9–10.3)

## 2017-09-18 LAB — FERRITIN: Ferritin: 29 ng/mL (ref 9–269)

## 2017-09-19 ENCOUNTER — Telehealth: Payer: Self-pay | Admitting: *Deleted

## 2017-09-19 NOTE — Telephone Encounter (Signed)
-----   Message from Truitt Merle, MD sent at 09/18/2017 11:36 PM EDT ----- Please let pt know the lab result, her iron level is still low, please schedule one dose iv iron next week. Thanks.   Truitt Merle  09/18/2017

## 2017-09-19 NOTE — Telephone Encounter (Signed)
Spoke with pt and informed pt of iron level still low.  Pt understood she will be contacted with appt for IV Feraheme next week.   Schedule message sent.

## 2017-09-20 ENCOUNTER — Telehealth: Payer: Self-pay | Admitting: Hematology

## 2017-09-20 ENCOUNTER — Telehealth: Payer: Self-pay

## 2017-09-20 NOTE — Telephone Encounter (Signed)
Scheduled appt per 4/25 sch msg - spoke w/ pt and confirmed appt.

## 2017-09-20 NOTE — Telephone Encounter (Signed)
Patient called to verify her upcoming appointment. Per 4/26 phone que

## 2017-09-26 ENCOUNTER — Inpatient Hospital Stay: Payer: Medicare Other | Attending: Nurse Practitioner

## 2017-09-26 DIAGNOSIS — Z79899 Other long term (current) drug therapy: Secondary | ICD-10-CM | POA: Insufficient documentation

## 2017-09-26 DIAGNOSIS — K922 Gastrointestinal hemorrhage, unspecified: Secondary | ICD-10-CM

## 2017-09-26 DIAGNOSIS — D500 Iron deficiency anemia secondary to blood loss (chronic): Secondary | ICD-10-CM | POA: Insufficient documentation

## 2017-09-26 MED ORDER — DIPHENHYDRAMINE HCL 50 MG/ML IJ SOLN
50.0000 mg | Freq: Once | INTRAMUSCULAR | Status: AC
  Administered 2017-09-26: 50 mg via INTRAVENOUS

## 2017-09-26 MED ORDER — FERUMOXYTOL INJECTION 510 MG/17 ML
510.0000 mg | Freq: Once | INTRAVENOUS | Status: AC
  Administered 2017-09-26: 510 mg via INTRAVENOUS
  Filled 2017-09-26: qty 17

## 2017-09-26 MED ORDER — FAMOTIDINE IN NACL 20-0.9 MG/50ML-% IV SOLN
20.0000 mg | Freq: Once | INTRAVENOUS | Status: AC
  Administered 2017-09-26: 20 mg via INTRAVENOUS

## 2017-09-26 NOTE — Patient Instructions (Signed)

## 2017-10-02 ENCOUNTER — Other Ambulatory Visit: Payer: Self-pay

## 2017-10-02 ENCOUNTER — Ambulatory Visit (INDEPENDENT_AMBULATORY_CARE_PROVIDER_SITE_OTHER): Payer: Medicare Other | Admitting: Internal Medicine

## 2017-10-02 VITALS — BP 122/68 | HR 100 | Temp 98.3°F | Wt 209.8 lb

## 2017-10-02 DIAGNOSIS — E1165 Type 2 diabetes mellitus with hyperglycemia: Secondary | ICD-10-CM | POA: Diagnosis not present

## 2017-10-02 DIAGNOSIS — E785 Hyperlipidemia, unspecified: Secondary | ICD-10-CM | POA: Diagnosis not present

## 2017-10-02 DIAGNOSIS — D500 Iron deficiency anemia secondary to blood loss (chronic): Secondary | ICD-10-CM | POA: Diagnosis not present

## 2017-10-02 DIAGNOSIS — F329 Major depressive disorder, single episode, unspecified: Secondary | ICD-10-CM

## 2017-10-02 DIAGNOSIS — Z9714 Presence of artificial left leg (complete) (partial): Secondary | ICD-10-CM

## 2017-10-02 DIAGNOSIS — Z8619 Personal history of other infectious and parasitic diseases: Secondary | ICD-10-CM | POA: Diagnosis not present

## 2017-10-02 DIAGNOSIS — K5521 Angiodysplasia of colon with hemorrhage: Secondary | ICD-10-CM

## 2017-10-02 DIAGNOSIS — E119 Type 2 diabetes mellitus without complications: Secondary | ICD-10-CM | POA: Diagnosis not present

## 2017-10-02 DIAGNOSIS — I5030 Unspecified diastolic (congestive) heart failure: Secondary | ICD-10-CM | POA: Diagnosis not present

## 2017-10-02 DIAGNOSIS — K769 Liver disease, unspecified: Secondary | ICD-10-CM | POA: Diagnosis not present

## 2017-10-02 DIAGNOSIS — M109 Gout, unspecified: Secondary | ICD-10-CM | POA: Diagnosis not present

## 2017-10-02 DIAGNOSIS — I110 Hypertensive heart disease with heart failure: Secondary | ICD-10-CM

## 2017-10-02 DIAGNOSIS — Z89612 Acquired absence of left leg above knee: Secondary | ICD-10-CM

## 2017-10-02 DIAGNOSIS — F1490 Cocaine use, unspecified, uncomplicated: Secondary | ICD-10-CM | POA: Diagnosis not present

## 2017-10-02 DIAGNOSIS — F1990 Other psychoactive substance use, unspecified, uncomplicated: Secondary | ICD-10-CM

## 2017-10-02 DIAGNOSIS — Z794 Long term (current) use of insulin: Secondary | ICD-10-CM

## 2017-10-02 DIAGNOSIS — R000 Tachycardia, unspecified: Secondary | ICD-10-CM

## 2017-10-02 DIAGNOSIS — Z79899 Other long term (current) drug therapy: Secondary | ICD-10-CM

## 2017-10-02 DIAGNOSIS — Z8679 Personal history of other diseases of the circulatory system: Secondary | ICD-10-CM

## 2017-10-02 DIAGNOSIS — Z9889 Other specified postprocedural states: Secondary | ICD-10-CM

## 2017-10-02 DIAGNOSIS — I10 Essential (primary) hypertension: Secondary | ICD-10-CM

## 2017-10-02 DIAGNOSIS — K7469 Other cirrhosis of liver: Secondary | ICD-10-CM | POA: Diagnosis not present

## 2017-10-02 LAB — POCT GLYCOSYLATED HEMOGLOBIN (HGB A1C): HEMOGLOBIN A1C: 4.8

## 2017-10-02 LAB — GLUCOSE, CAPILLARY: GLUCOSE-CAPILLARY: 107 mg/dL — AB (ref 65–99)

## 2017-10-02 NOTE — Patient Instructions (Signed)
We'll plan on getting your liver ultrasound in the next couple of weeks. If you don't hear about an appointment in 1 week, please call our clinic.  We'll keep your medicines the same for now. If you find that you have low sugars (less than 70), please call our clinic and we will adjust your medicines.

## 2017-10-02 NOTE — Progress Notes (Signed)
CC: hypertension  HPI:  [name] is a 67 y.o. with a PMH of HCV in SVR, T2DM, HTN, HLD, gout, MDD, chronic GI bleeding from AVMs, and cocaine use disorderpresenting to clinic for follow up on HTN, diabetes, and palpitations   Please see problem based Assessment and Plan for status of patients chronic conditions.  Past Medical History:  Diagnosis Date  . Allergic rhinitis   . Anemia, iron deficiency 05/03/2011   2/2 GI AVMs - recieves iron infusions as well as periodic red blood cell transfusions  . Anxiety   . Arteriovenous malformation of duodenum 02/25/2015  . CARPAL TUNNEL RELEASE, HX OF 07/22/2008   Qualifier: Diagnosis of  By: Tomma Lightning MD, Claiborne Billings    . Colon polyps   . Diabetes mellitus without complication ([place])   . Difficulty sleeping    takes trazadone for sleep  . Diverticulosis   . GERD (gastroesophageal reflux disease)   . Hepatitis C    in SVR with Harvoni 12/2014-02/2015.   Marland Kitchen History of hernia repair 08/18/2012  . History of porphyria 11/30/2014   Porphyria cutanea tarda in setting of chronic Hepatitis C, frequent  IV iron infusions, and alcohol abuse   . Hypertension   . Peripheral vascular disease ([place])   . Stroke [facility]) 2010   no residual problems  . Substance use disorder    cocaine    Review of Systems:   ROS  Patient denies HA, vision or hearing changes Patient denies chest pain, shortness of breath, palpitations, orthopnea, cough, hemoptysis Patient denies abd pain, nausea, vomiting, poor appetite, hematochezia or melena Patient endorses RLE swelling  Physical Exam:  Vitals:   10/02/17 1402 10/02/17 1442  BP: (!) 143/73 122/68  Pulse: (!) 101 100  Temp: 98.3 F (36.8 C)   TempSrc: Oral   SpO2: 97%   Weight: 209 lb 12.8 oz (95.2 kg)    GENERAL- alert, co-operative, appears as stated age, not in any distress. HEENT- EOMI, oral mucosa appears moist CARDIAC- RRR, no murmurs, rubs or gallops. RESP- Moving equal volumes of air, and clear  to auscultation bilaterally, no wheezes or crackles. ABDOMEN- Soft, nontender, bowel sounds present. EXTREMITIES- pulse 2+ radial, RPT, mild RLE edema. S/p L AKA with prosthesis in place SKIN- Warm, dry PSYCH- Normal mood and affect, appropriate thought content and speech.  Assessment & Plan:   Essential hypertension, benign Well controlled on repeat BP measurement. Patient asymptomatic. Denies current cocaine use. She does have RLE edema which appears stable from previous visit and is likely related to CCB. No other s/sx of CHF; Echo 3/19 with preserved EF, G1DD.  Plan: --Continue losartan 100mg  daily and diltiazem 180mg  daily  History of hepatitis C Patient with h/o HCV now in SVR as of 08/2015. Last U/S 10/18 shows hepatic changes consistent with cirrhosis. She has already received HAV and HBV vaccinations.   Recent labs reviewed - plts wnl; LFTs wnl  Plan: --55mo HCC screening abd u/s ordered --AFP wnl today  Type 2 diabetes mellitus ([place]) Patient compliant with metformin 1000mg  BID and lantus 10 units daily. She denies hypoglycemic episodes and states her CBGs in the morning are well controlled. She did not bring her meter in today.  Plan: --A1c today 4.8 - well controlled though may not be completely accurate in setting of anemia --continue current regimen --f/u in 3 months --will revisit starting statin at next visit (held off due to colchicine prophylaxis with allopurinol titration)  Iron deficiency anemia due to chronic blood  loss Patient with chronic iron deficiency anemia due to chronic GI blood loss from AVMs; followed by heme and receives periodic iron infusions, rare rbc transfusions. She had been stable for some months, however was found to have 3pt Hgb drop during evaluation for new onset palpitations; she has received feraheme infusions for this with some improvement in ferritin and Hgb. Per patient had heme positive stools. No other indication for other sources of  anemia at this time; her hepatic panel is unremarkable, plts wnl, no other s/sx of blood loss.   Plan: --patient to continue following with Dr. Burr Medico --patient to f/u with GI again this month for possible re-scope  Tachycardia Patient with resolution of tachycardia and palpitations since iron infusions. Other w/u for tachycardia included echo (preserved EF, G1DD), TSH/T3/T4 wnl. No s/sx of PE.  Plan: --continue monitoring for recurrence; otherwise continue current management  Substance use disorder Patient continues to pursue counseling which she has found extremely helpful. She denies recent cocaine use. She endorses improvement in her mood.  Plan: --encouraged continued counseling --encouraged continued abstinence from cocaine use   See Encounters Tab for problem based charting.   Patient discussed with Dr. Verdie Drown, MD Internal Medicine PGY2

## 2017-10-03 DIAGNOSIS — G933 Postviral fatigue syndrome: Secondary | ICD-10-CM | POA: Diagnosis not present

## 2017-10-03 LAB — AFP TUMOR MARKER: AFP, SERUM, TUMOR MARKER: 3.3 ng/mL (ref 0.0–8.3)

## 2017-10-04 ENCOUNTER — Encounter: Payer: Self-pay | Admitting: Internal Medicine

## 2017-10-04 NOTE — Assessment & Plan Note (Signed)
>>  ASSESSMENT AND PLAN FOR TYPE 2 DIABETES MELLITUS WITH DIABETIC NEUROPATHY ([facility]) WRITTEN ON 10/04/2017 12:55 PM BY LORETA LEVELS, MD  Patient compliant with metformin  1000mg  BID and lantus  10 units daily. She denies hypoglycemic episodes and states her CBGs in the morning are well controlled. She did not bring her meter in today.  Plan: --A1c today 4.8 - well controlled though may not be completely accurate in setting of anemia --continue current regimen --f/u in 3 months --will revisit starting statin at next visit (held off due to colchicine  prophylaxis with allopurinol  titration)

## 2017-10-04 NOTE — Assessment & Plan Note (Signed)
Well controlled on repeat BP measurement. Patient asymptomatic. Denies current cocaine use. She does have RLE edema which appears stable from previous visit and is likely related to CCB. No other s/sx of CHF; Echo 3/19 with preserved EF, G1DD.  Plan: --Continue losartan 100mg  daily and diltiazem 180mg  daily

## 2017-10-04 NOTE — Assessment & Plan Note (Signed)
Patient with resolution of tachycardia and palpitations since iron infusions. Other w/u for tachycardia included echo (preserved EF, G1DD), TSH/T3/T4 wnl. No s/sx of PE.  Plan: --continue monitoring for recurrence; otherwise continue current management

## 2017-10-04 NOTE — Assessment & Plan Note (Signed)
Patient compliant with metformin 1000mg  BID and lantus 10 units daily. She denies hypoglycemic episodes and states her CBGs in the morning are well controlled. She did not bring her meter in today.  Plan: --A1c today 4.8 - well controlled though may not be completely accurate in setting of anemia --continue current regimen --f/u in 3 months --will revisit starting statin at next visit (held off due to colchicine prophylaxis with allopurinol titration)

## 2017-10-04 NOTE — Assessment & Plan Note (Signed)
Patient continues to pursue counseling which she has found extremely helpful. She denies recent cocaine use. She endorses improvement in her mood.  Plan: --encouraged continued counseling --encouraged continued abstinence from cocaine use

## 2017-10-04 NOTE — Assessment & Plan Note (Addendum)
Patient with h/o HCV now in SVR as of 08/2015. Last U/S 10/18 shows hepatic changes consistent with cirrhosis. She has already received HAV and HBV vaccinations.   Recent labs reviewed - plts wnl; LFTs wnl  Plan: --37mo HCC screening abd u/s ordered --AFP wnl today

## 2017-10-04 NOTE — Assessment & Plan Note (Signed)
Patient with chronic iron deficiency anemia due to chronic GI blood loss from AVMs; followed by heme and receives periodic iron infusions, rare rbc transfusions. She had been stable for some months, however was found to have 3pt Hgb drop during evaluation for new onset palpitations; she has received feraheme infusions for this with some improvement in ferritin and Hgb. Per patient had heme positive stools. No other indication for other sources of anemia at this time; her hepatic panel is unremarkable, plts wnl, no other s/sx of blood loss.   Plan: --patient to continue following with Dr. Burr Medico --patient to f/u with GI again this month for possible re-scope

## 2017-10-07 ENCOUNTER — Other Ambulatory Visit: Payer: Self-pay | Admitting: Internal Medicine

## 2017-10-07 NOTE — Progress Notes (Signed)
Medicine attending: Medical history, presenting problems, physical findings, and medications, reviewed with resident physician Dr Gorica Svalina on the day of the patient visit and I concur with her evaluation and management plan. 

## 2017-10-09 ENCOUNTER — Telehealth: Payer: Self-pay

## 2017-10-09 ENCOUNTER — Inpatient Hospital Stay: Payer: Medicare Other

## 2017-10-09 DIAGNOSIS — Z79899 Other long term (current) drug therapy: Secondary | ICD-10-CM | POA: Diagnosis not present

## 2017-10-09 DIAGNOSIS — D500 Iron deficiency anemia secondary to blood loss (chronic): Secondary | ICD-10-CM | POA: Diagnosis not present

## 2017-10-09 LAB — CBC WITH DIFFERENTIAL/PLATELET
Basophils Absolute: 0 10*3/uL (ref 0.0–0.1)
Basophils Relative: 0 %
EOS PCT: 2 %
Eosinophils Absolute: 0.1 10*3/uL (ref 0.0–0.5)
HCT: 36.4 % (ref 34.8–46.6)
HEMOGLOBIN: 11 g/dL — AB (ref 11.6–15.9)
LYMPHS ABS: 1.3 10*3/uL (ref 0.9–3.3)
LYMPHS PCT: 18 %
MCH: 30.4 pg (ref 25.1–34.0)
MCHC: 30.2 g/dL — ABNORMAL LOW (ref 31.5–36.0)
MCV: 100.6 fL (ref 79.5–101.0)
MONOS PCT: 10 %
Monocytes Absolute: 0.7 10*3/uL (ref 0.1–0.9)
Neutro Abs: 5.2 10*3/uL (ref 1.5–6.5)
Neutrophils Relative %: 70 %
Platelets: 269 10*3/uL (ref 145–400)
RBC: 3.62 MIL/uL — AB (ref 3.70–5.45)
RDW: 18.1 % — ABNORMAL HIGH (ref 11.2–14.5)
WBC: 7.3 10*3/uL (ref 3.9–10.3)

## 2017-10-09 NOTE — Telephone Encounter (Signed)
-----   Message from Truitt Merle, MD sent at 10/09/2017  2:34 PM EDT ----- Please let pt know the CBC result, anemia improved. Thanks   Truitt Merle  10/09/2017

## 2017-10-09 NOTE — Telephone Encounter (Signed)
Per Dr. Burr Medico spoke with patient informed anemia has improved, patient verbalized an understanding.

## 2017-10-11 ENCOUNTER — Encounter: Payer: Self-pay | Admitting: Gastroenterology

## 2017-10-11 ENCOUNTER — Ambulatory Visit (INDEPENDENT_AMBULATORY_CARE_PROVIDER_SITE_OTHER): Payer: Medicare Other | Admitting: Gastroenterology

## 2017-10-11 VITALS — BP 140/80 | HR 100 | Ht 62.0 in | Wt 172.2 lb

## 2017-10-11 DIAGNOSIS — D500 Iron deficiency anemia secondary to blood loss (chronic): Secondary | ICD-10-CM | POA: Diagnosis not present

## 2017-10-11 DIAGNOSIS — E119 Type 2 diabetes mellitus without complications: Secondary | ICD-10-CM | POA: Diagnosis not present

## 2017-10-11 DIAGNOSIS — K921 Melena: Secondary | ICD-10-CM

## 2017-10-11 DIAGNOSIS — K5521 Angiodysplasia of colon with hemorrhage: Secondary | ICD-10-CM | POA: Diagnosis not present

## 2017-10-11 NOTE — Patient Instructions (Signed)
If you are age 65 or older, your body mass index should be between 23-30. Your Body mass index is 31.5 kg/m. If this is out of the aforementioned range listed, please consider follow up with your Primary Care Provider.  If you are age 63 or younger, your body mass index should be between 19-25. Your Body mass index is 31.5 kg/m. If this is out of the aformentioned range listed, please consider follow up with your Primary Care Provider.   You have been scheduled for an enteroscopy. Please follow written instructions given to you at your visit today. If you use inhalers (even only as needed), please bring them with you on the day of your procedure. Your physician has requested that you go to www.startemmi.com and enter the access code given to you at your visit today. This web site gives a general overview about your procedure. However, you should still follow specific instructions given to you by our office regarding your preparation for the procedure.  Thank you for choosing [facility]  Dr Wilfrid Lund III

## 2017-10-11 NOTE — Progress Notes (Signed)
[facility] Progress Note  Chief Complaint: Melena and iron deficiency anemia.  Subjective  History:  This is a very pleasant 67 year old woman known to me from an office visit March 2018.  In that note I outlined in detail her previous history of chronic GI bleeding seen primarily by Dr. Deatra Ina.  It is also outlined extensively in her most recent hematology note by Dr. Burr Medico.  Essentially, she has had recurrent bleeding over 4 to 5 years from small bowel AVMs.  She has had many endoscopic procedures that have determined there is intermittent small bowel AVM bleeding.  Therapy has been applied during both upper endoscopy and enteroscopy done at [facility] in 2016.  She required periodic iron infusion, and the most recent hematology note indicates that that infusion need was more frequent in the last several months.  Alesi also reports an episode of black tarry stool for a couple of days just prior to her visit in early April with Dr. Burr Medico.  She received IV iron treatment in September 2018, February, March and about 2 weeks ago. Marcelle denies chronic abdominal pain, nausea, vomiting or dysphagia. She has fatigue and right lower extremity edema.  ROS: Cardiovascular:  no chest pain Respiratory: no dyspnea Remainder of systems negative except as in history  The patient's Past Medical, Family and Social History were reviewed and are on file in the EMR.  Objective:  Med list reviewed  Current Outpatient Medications:  .  ACCU-CHEK FASTCLIX LANCETS MISC, Use to test blood glucose 3 times daily., Disp: 100 each, Rfl: 11 .  allopurinol (ZYLOPRIM) 300 MG tablet, Take 1 tablet (300 mg total) by mouth daily., Disp: 30 tablet, Rfl: 4 .  aspirin EC 81 MG tablet, Take 1 tablet (81 mg total) by mouth daily., Disp: 30 tablet, Rfl: 1 .  cetirizine (ZYRTEC) 10 MG tablet, TAKE 1 TABLET BY MOUTH DAILY, Disp: 90 tablet, Rfl: 1 .  cholecalciferol (VITAMIN D) 1000 units tablet, Take 1 tablet (1,000 Units total)  by mouth daily., Disp: 100 tablet, Rfl: 2 .  colchicine 0.6 MG tablet, TAKE ONE TABLET BY MOUTH DAILY, Disp: 90 tablet, Rfl: 0 .  diclofenac sodium (VOLTAREN) 1 % GEL, Apply 4 g topically 4 (four) times daily as needed., Disp: 100 g, Rfl: 1 .  DILT-XR 180 MG 24 hr capsule, TAKE 1 CAPSULE BY MOUTH ONCE DAILY, Disp: 30 capsule, Rfl: 2 .  feeding supplement, GLUCERNA SHAKE, (GLUCERNA SHAKE) LIQD, Take 237 mLs by mouth 3 (three) times daily between meals., Disp: 90 Can, Rfl: 0 .  ferrous sulfate 325 (65 FE) MG EC tablet, Take 1 tablet (325 mg total) by mouth daily after breakfast., Disp: 30 tablet, Rfl: 1 .  FLUoxetine (PROZAC) 20 MG capsule, TAKE 3 CAPSULES BY MOUTH DAILY, Disp: 90 capsule, Rfl: 2 .  gabapentin (NEURONTIN) 400 MG capsule, TAKE 2 CAPSULES BY MOUTH THREE TIMES A DAY, Disp: 180 capsule, Rfl: 5 .  glucose blood (ACCU-CHEK GUIDE) test strip, Check blood sugar 3 times a day, Disp: 100 each, Rfl: 12 .  Insulin Glargine (LANTUS) 100 UNIT/ML Solostar Pen, Inject 10 Units into the skin daily at 10 pm., Disp: 15 mL, Rfl: 2 .  Insulin Pen Needle (UNIFINE PENTIPS) 32G X 4 MM MISC, THE PATIENT IS INSULIN REQUIRING. USE TO INJECT INSULIN ONCE PER DAY. Diagnosis code E11.9, Disp: 100 each, Rfl: 0 .  losartan (COZAAR) 100 MG tablet, TAKE 1 TABLET BY MOUTH EVERY DAY, Disp: 90 tablet, Rfl: 3 .  metFORMIN (GLUCOPHAGE) 1000 MG tablet, Take 1 tablet (1,000 mg total) by mouth 2 (two) times daily with a meal., Disp: 180 tablet, Rfl: 3 .  omeprazole (PRILOSEC) 20 MG capsule, TAKE 1 CAPSULE BY MOUTH EVERY DAY **STOP NEXIUM**, Disp: 30 capsule, Rfl: 5 .  Potassium Chloride ER 20 MEQ TBCR, Take 20 mEq by mouth daily., Disp: 90 tablet, Rfl: 1 .  traZODone (DESYREL) 50 MG tablet, TAKE ONE TABLET BY MOUTH AT BEDTIME, Disp: 30 tablet, Rfl: 2 .  vitamin C (ASCORBIC ACID) 500 MG tablet, Take 500 mg by mouth every morning., Disp: , Rfl:    Vital signs in last 24 hrs: Vitals:   10/11/17 1347  BP: 140/80  Pulse: 100     Physical Exam  Pleasant and well-appearing woman in a wheelchair, which limits exam somewhat  HEENT: sclera anicteric, oral mucosa moist without lesions  Neck: supple, no thyromegaly, JVD or lymphadenopathy  Cardiac: RRR without murmurs, S1S2 heard, mild right peripheral edema, left lower leg prosthesis  Pulm: clear to auscultation bilaterally, normal RR and effort noted  Abdomen: soft, no tenderness, with active bowel sounds. No guarding or palpable hepatosplenomegaly.  She was in a semireclined position for the exam in a wheelchair.  She has a lower midline scar that healed by secondary intention and protuberance of the lower abdominal wall left greater than right from prior mesh repair.  Skin; warm and dry, no jaundice or rash   Recent Labs:  CBC Latest Ref Rng & Units 10/09/2017 09/18/2017 08/29/2017  WBC 3.9 - 10.3 K/uL 7.3 6.1 8.8  Hemoglobin 11.6 - 15.9 g/dL 11.0(L) 9.2(L) 9.2(L)  Hematocrit 34.8 - 46.6 % 36.4 29.3(L) 30.8(L)  Platelets 145 - 400 K/uL 269 313 386   CMP Latest Ref Rng & Units 07/15/2017 01/31/2017 09/25/2016  Glucose 70 - 140 mg/dL 120 104 132(H)  BUN 7 - 26 mg/dL 29(H) 14.0 14  Creatinine 0.60 - 1.10 mg/dL 1.13(H) 0.9 0.75  Sodium 136 - 145 mmol/L 138 141 143  Potassium 3.5 - 5.1 mmol/L 4.7 4.4 3.7  Chloride 98 - 109 mmol/L 107 - 104  CO2 22 - 29 mmol/L 23 22 22   Calcium 8.4 - 10.4 mg/dL 9.4 9.0 7.7(L)  Total Protein 6.4 - 8.3 g/dL 7.0 7.1 -  Total Bilirubin 0.2 - 1.2 mg/dL <0.2(L) 0.26 -  Alkaline Phos 40 - 150 U/L 66 71 -  AST 5 - 34 U/L 39(H) 25 -  ALT 0 - 55 U/L 30 21 -    MCV 100  AFP 3.3  Hgb A1c 4.8  Last INR 1.0 in 08/17/16  No appearance of cirrhosis CTAP May 2018  @ASSESSMENTPLANBEGIN @ Assessment: Encounter Diagnoses  Name Primary?  . Melena Yes  . Anemia due to chronic blood loss   . AVM (arteriovenous malformation) of small bowel, acquired with hemorrhage    This patient has many years of chronic occult and overt small bowel  blood loss or known AVMs.  This is typically a very challenging scenario because these lesions are usually multiple and evanescent with intermittent bleeding. It is not clear to what extent previous endoscopic interventions have decreased the frequency or volume of blood loss and the need for transfusion or iron.  Fortunately she has not required a PRBC transfusion for a few years, and is maintaining a reasonably good hemoglobin on IV iron.  She had a drop in that hemoglobin within the last few months which prompted a referral back to [place].  It has responded well  to another treatment of IV iron. Recent hematology note also suggested she needed to follow-up with [place] over history of cirrhosis and need for hepatoma surveillance.  However, I do not see evidence that this patient is clearly cirrhotic on prior results.  I do not feel she needs a colonoscopy at this point.  If we are able to localize any bleeding source, I believe will be in the small bowel as before.  Plan: Small bowel enteroscopy with possible APC if AVMs are discovered.  We discussed the nature of this procedure, risks and benefits and she is agreeable.  The benefits and risks of the planned procedure were described in detail with the patient or (when appropriate) their health care proxy.  Risks were outlined as including, but not limited to, bleeding, infection, perforation, adverse medication reaction leading to cardiac or pulmonary decompensation, or pancreatitis (if ERCP).  The limitation of incomplete mucosal visualization was also discussed.  No guarantees or warranties were given.  If no bleeding source is discovered on the study, or if the source is found in the therapy given does not decrease her need for IV iron treatments, then she would need continued close monitoring by hematology and and given blood or IV iron as needed.  Total time 30 minutes, over half spent face-to-face with patient in counseling and coordination of care.    Nelida Meuse III

## 2017-10-14 ENCOUNTER — Other Ambulatory Visit: Payer: Medicare Other

## 2017-10-14 ENCOUNTER — Ambulatory Visit: Payer: Medicare Other | Admitting: Hematology

## 2017-10-14 ENCOUNTER — Telehealth: Payer: Self-pay

## 2017-10-14 NOTE — Telephone Encounter (Signed)
Pt came in today and filled out walk-in form with c/o eye swelling and pain. Pt currently being followed for IDA d/t chronic blood loss by Dr. Burr Medico. Recommendation for pt to be seen at urgent care or to get sooner appointment with eye doctor. Additional concern is that pt calendar had appointments scheduled today for lab and clinic visit with Dr. Burr Medico, but she came in and did not have an appointment. Pt requested to speak with [name]. Katie in meeting at the time, went back to gather pt preferred phone number for contact, and she had already left. High priority message sent to Melissa X and [name] requesting contact with the pt r/t lack of phone call response and appointment change without notification.

## 2017-10-16 ENCOUNTER — Ambulatory Visit (HOSPITAL_COMMUNITY): Payer: Medicare Other

## 2017-10-16 ENCOUNTER — Encounter (HOSPITAL_COMMUNITY): Payer: Self-pay

## 2017-10-16 ENCOUNTER — Ambulatory Visit (HOSPITAL_COMMUNITY)
Admission: RE | Admit: 2017-10-16 | Discharge: 2017-10-16 | Disposition: A | Discharge status: Home / Self Care | Payer: Medicare Other | Source: Ambulatory Visit | Attending: Internal Medicine | Admitting: Internal Medicine

## 2017-10-16 ENCOUNTER — Other Ambulatory Visit: Payer: Self-pay | Admitting: Internal Medicine

## 2017-10-16 DIAGNOSIS — Z1159 Encounter for screening for other viral diseases: Secondary | ICD-10-CM | POA: Insufficient documentation

## 2017-10-16 DIAGNOSIS — Z1389 Encounter for screening for other disorder: Secondary | ICD-10-CM | POA: Diagnosis present

## 2017-10-16 DIAGNOSIS — Z8619 Personal history of other infectious and parasitic diseases: Secondary | ICD-10-CM | POA: Insufficient documentation

## 2017-10-16 DIAGNOSIS — K7460 Unspecified cirrhosis of liver: Secondary | ICD-10-CM | POA: Diagnosis not present

## 2017-10-17 DIAGNOSIS — H16422 Pannus (corneal), left eye: Secondary | ICD-10-CM | POA: Diagnosis not present

## 2017-10-17 DIAGNOSIS — H16042 Marginal corneal ulcer, left eye: Secondary | ICD-10-CM | POA: Diagnosis not present

## 2017-10-17 DIAGNOSIS — H20012 Primary iridocyclitis, left eye: Secondary | ICD-10-CM | POA: Diagnosis not present

## 2017-10-17 MED FILL — TOBRAMYCIN-DEXAMETH OPTH SU: 0.3-0.1 | 25 days supply | Qty: 5 | Fill #0

## 2017-10-18 ENCOUNTER — Other Ambulatory Visit: Payer: Self-pay | Admitting: Family Medicine

## 2017-10-18 DIAGNOSIS — Z1231 Encounter for screening mammogram for malignant neoplasm of breast: Secondary | ICD-10-CM

## 2017-10-18 MED FILL — UNIFINE PENTIPS 32GX5/32": 32G X 4 MM | 90 days supply | Qty: 100 | Fill #0

## 2017-10-18 MED FILL — UNIFINE PENTIPS 32GX5/32: 32G X 4 MM | 90 days supply | Qty: 100 | Fill #0

## 2017-10-21 ENCOUNTER — Encounter: Payer: Self-pay | Admitting: *Deleted

## 2017-10-23 ENCOUNTER — Other Ambulatory Visit: Payer: Self-pay

## 2017-10-23 ENCOUNTER — Encounter (HOSPITAL_COMMUNITY): Payer: Self-pay

## 2017-10-24 DIAGNOSIS — H04123 Dry eye syndrome of bilateral lacrimal glands: Secondary | ICD-10-CM | POA: Diagnosis not present

## 2017-10-24 DIAGNOSIS — H10413 Chronic giant papillary conjunctivitis, bilateral: Secondary | ICD-10-CM | POA: Diagnosis not present

## 2017-10-24 DIAGNOSIS — H16042 Marginal corneal ulcer, left eye: Secondary | ICD-10-CM | POA: Diagnosis not present

## 2017-10-24 MED FILL — CROMOLYN 4% EYE DROPS: 4 | 25 days supply | Qty: 10 | Fill #0

## 2017-10-29 ENCOUNTER — Ambulatory Visit (HOSPITAL_COMMUNITY): Payer: Medicare Other | Admitting: Anesthesiology

## 2017-10-29 ENCOUNTER — Encounter (HOSPITAL_COMMUNITY): Payer: Self-pay

## 2017-10-29 ENCOUNTER — Encounter (HOSPITAL_COMMUNITY)
Admission: RE | Disposition: A | Discharge status: Home / Self Care | Payer: Self-pay | Source: Ambulatory Visit | Attending: Gastroenterology

## 2017-10-29 ENCOUNTER — Ambulatory Visit (HOSPITAL_COMMUNITY)
Admission: RE | Admit: 2017-10-29 | Discharge: 2017-10-29 | Disposition: A | Discharge status: Home / Self Care | Payer: Medicare Other | Source: Ambulatory Visit | Attending: Gastroenterology | Admitting: Gastroenterology

## 2017-10-29 ENCOUNTER — Other Ambulatory Visit: Payer: Self-pay

## 2017-10-29 DIAGNOSIS — Z6831 Body mass index (BMI) 31.0-31.9, adult: Secondary | ICD-10-CM | POA: Insufficient documentation

## 2017-10-29 DIAGNOSIS — M1990 Unspecified osteoarthritis, unspecified site: Secondary | ICD-10-CM | POA: Insufficient documentation

## 2017-10-29 DIAGNOSIS — Z7982 Long term (current) use of aspirin: Secondary | ICD-10-CM | POA: Insufficient documentation

## 2017-10-29 DIAGNOSIS — F419 Anxiety disorder, unspecified: Secondary | ICD-10-CM | POA: Diagnosis not present

## 2017-10-29 DIAGNOSIS — Z888 Allergy status to other drugs, medicaments and biological substances status: Secondary | ICD-10-CM | POA: Insufficient documentation

## 2017-10-29 DIAGNOSIS — E669 Obesity, unspecified: Secondary | ICD-10-CM | POA: Diagnosis not present

## 2017-10-29 DIAGNOSIS — D500 Iron deficiency anemia secondary to blood loss (chronic): Secondary | ICD-10-CM | POA: Diagnosis not present

## 2017-10-29 DIAGNOSIS — F329 Major depressive disorder, single episode, unspecified: Secondary | ICD-10-CM | POA: Insufficient documentation

## 2017-10-29 DIAGNOSIS — I10 Essential (primary) hypertension: Secondary | ICD-10-CM | POA: Insufficient documentation

## 2017-10-29 DIAGNOSIS — Z8673 Personal history of transient ischemic attack (TIA), and cerebral infarction without residual deficits: Secondary | ICD-10-CM | POA: Diagnosis not present

## 2017-10-29 DIAGNOSIS — E1151 Type 2 diabetes mellitus with diabetic peripheral angiopathy without gangrene: Secondary | ICD-10-CM | POA: Diagnosis not present

## 2017-10-29 DIAGNOSIS — Z881 Allergy status to other antibiotic agents status: Secondary | ICD-10-CM | POA: Insufficient documentation

## 2017-10-29 DIAGNOSIS — Z86010 Personal history of colonic polyps: Secondary | ICD-10-CM | POA: Insufficient documentation

## 2017-10-29 DIAGNOSIS — K921 Melena: Secondary | ICD-10-CM

## 2017-10-29 DIAGNOSIS — Z885 Allergy status to narcotic agent status: Secondary | ICD-10-CM | POA: Insufficient documentation

## 2017-10-29 DIAGNOSIS — Z89612 Acquired absence of left leg above knee: Secondary | ICD-10-CM | POA: Diagnosis not present

## 2017-10-29 DIAGNOSIS — B182 Chronic viral hepatitis C: Secondary | ICD-10-CM | POA: Insufficient documentation

## 2017-10-29 DIAGNOSIS — E559 Vitamin D deficiency, unspecified: Secondary | ICD-10-CM | POA: Diagnosis not present

## 2017-10-29 DIAGNOSIS — Z9049 Acquired absence of other specified parts of digestive tract: Secondary | ICD-10-CM | POA: Insufficient documentation

## 2017-10-29 DIAGNOSIS — T17918A Gastric contents in respiratory tract, part unspecified causing other injury, initial encounter: Secondary | ICD-10-CM | POA: Diagnosis not present

## 2017-10-29 DIAGNOSIS — K219 Gastro-esophageal reflux disease without esophagitis: Secondary | ICD-10-CM | POA: Diagnosis not present

## 2017-10-29 DIAGNOSIS — Z794 Long term (current) use of insulin: Secondary | ICD-10-CM | POA: Diagnosis not present

## 2017-10-29 DIAGNOSIS — Z880 Allergy status to penicillin: Secondary | ICD-10-CM | POA: Diagnosis not present

## 2017-10-29 DIAGNOSIS — K5521 Angiodysplasia of colon with hemorrhage: Secondary | ICD-10-CM

## 2017-10-29 HISTORY — PX: ENTEROSCOPY: SHX5533

## 2017-10-29 LAB — GLUCOSE, CAPILLARY: Glucose-Capillary: 125 mg/dL — ABNORMAL HIGH (ref 65–99)

## 2017-10-29 SURGERY — ENTEROSCOPY
Anesthesia: Monitor Anesthesia Care

## 2017-10-29 MED ORDER — SODIUM CHLORIDE 0.9 % IV SOLN: INTRAVENOUS | Status: DC

## 2017-10-29 MED ORDER — LACTATED RINGERS IV SOLN
INTRAVENOUS | Status: DC
  Administered 2017-10-29: 10:00:00 via INTRAVENOUS

## 2017-10-29 MED ORDER — PROPOFOL 10 MG/ML IV BOLUS
INTRAVENOUS | Status: AC
  Filled 2017-10-29: qty 40

## 2017-10-29 MED ORDER — PROPOFOL 500 MG/50ML IV EMUL
INTRAVENOUS | Status: DC | PRN
  Administered 2017-10-29: 40 mg via INTRAVENOUS

## 2017-10-29 MED ORDER — PROPOFOL 500 MG/50ML IV EMUL: INTRAVENOUS | Status: DC | PRN

## 2017-10-29 MED ORDER — PROPOFOL 500 MG/50ML IV EMUL
INTRAVENOUS | Status: DC | PRN
  Administered 2017-10-29: 125 ug/kg/min via INTRAVENOUS

## 2017-10-29 NOTE — Transfer of Care (Signed)
Immediate Anesthesia Transfer of Care Note  Patient: [name]  Procedure(s) Performed: ENTEROSCOPY (N/A )  Patient Location: PACU  Anesthesia Type:MAC  Level of Consciousness: awake, alert  and oriented  Airway & Oxygen Therapy: Patient Spontanous Breathing and Patient connected to nasal cannula oxygen  Post-op Assessment: Report given to RN and Post -op Vital signs reviewed and stable  Post vital signs: Reviewed and stable  Last Vitals:  Vitals Value Taken Time  BP    Temp    Pulse    Resp    SpO2      Last Pain:  Vitals:   10/29/17 0957  TempSrc: Oral  PainSc: 0-No pain         Complications: No apparent anesthesia complications

## 2017-10-29 NOTE — Op Note (Signed)
[facility] Patient Name: [name] Procedure Date: 10/29/2017 MRN: [id number] Attending MD: Estill Cotta. Loletha [name] , MD Date of Birth: 01/05/1951 CSN: [id number] Age: 67 Admit Type: Outpatient Procedure:                Small bowel enteroscopy Indications:              Iron deficiency anemia secondary to chronic blood                            loss (known history of SB AVMs) Providers:                Estill Cotta. Loletha Carrow, MD, Cleda Daub, RN, Cherylynn Ridges, Technician, Rosario Adie, CRNA Referring MD:             Truitt Merle, MD Medicines:                Monitored Anesthesia Care Complications:            No immediate complications. Estimated Blood Loss:     Estimated blood loss: none. Procedure:                Pre-Anesthesia Assessment:                           - Prior to the procedure, a History and Physical                            was performed, and patient medications and                            allergies were reviewed. The patient's tolerance of                            previous anesthesia was also reviewed. The risks                            and benefits of the procedure and the sedation                            options and risks were discussed with the patient.                            All questions were answered, and informed consent                            was obtained. Anticoagulants: The patient has taken                            aspirin. It was decided not to withhold this                            medication prior to the procedure. ASA Grade  Assessment: III - A patient with severe systemic                            disease. After reviewing the risks and benefits,                            the patient was deemed in satisfactory condition to                            undergo the procedure.                           After obtaining informed consent, the endoscope was         passed under direct vision. Throughout the                            procedure, the patient's blood pressure, pulse, and                            oxygen saturations were monitored continuously. The                            [id number] ([id number]) scope was introduced through                            the mouth and advanced to the proximal jejunum. The                            small bowel enteroscopy was performed with                            difficulty due to presence of food and the                            patient's oxygen desaturation. Successful                            completion of the procedure was aided by performing                            chin lift and administering oxygen. The patient                            tolerated the procedure poorly. Scope In: Scope Out: Findings:      The examined esophagus was normal.      A medium amount of food (residue) was found in the gastric body.      The examined duodenum was normal.      There was no evidence of significant pathology in the entire examined       portion of jejunum. Impression:               - Normal esophagus.                           -  A medium amount of food (residue) in the stomach.                           - Normal examined duodenum.                           - The examined portion of the jejunum was normal.                           - No specimens collected.                           No SB AVMs seen. Recommendation:           - Patient has a contact number available for                            emergencies. The signs and symptoms of potential                            delayed complications were discussed with the                            patient. Return to normal activities tomorrow.                            Written discharge instructions were provided to the                            patient.                           - Resume previous diet. Procedure Code(s):        --- Professional  ---                           [phone], Small intestinal endoscopy, enteroscopy                            beyond second portion of duodenum, not including                            ileum; diagnostic, including collection of                            specimen(s) by brushing or washing, when performed                            (separate procedure) Diagnosis Code(s):        --- Professional ---                           D50.0, Iron deficiency anemia secondary to blood                            loss (chronic) CPT copyright 2017 [employer] Association. All rights  reserved. The codes documented in this report are preliminary and upon coder review may  be revised to meet current compliance requirements. Patricia Juneau [name], MD 10/29/2017 10:55:10 AM This report has been signed electronically. Number of Addenda: 0

## 2017-10-29 NOTE — Discharge Instructions (Signed)
Esophagogastroduodenoscopy, Care After °Refer to this sheet in the next few weeks. These instructions provide you with information about caring for yourself after your procedure. Your health care provider may also give you more specific instructions. Your treatment has been planned according to current medical practices, but problems sometimes occur. Call your health care provider if you have any problems or questions after your procedure. °What can I expect after the procedure? °After the procedure, it is common to have: °· A sore throat. °· Nausea. °· Bloating. °· Dizziness. °· Fatigue. ° °Follow these instructions at home: °· Do not eat or drink anything until the numbing medicine (local anesthetic) has worn off and your gag reflex has returned. You will know that the local anesthetic has worn off when you can swallow comfortably. °· Do not drive for 24 hours if you received a medicine to help you relax (sedative). °· If your health care provider took a tissue sample for testing during the procedure, make sure to get your test results. This is your responsibility. Ask your health care provider or the department performing the test when your results will be ready. °· Keep all follow-up visits as told by your health care provider. This is important. °Contact a health care provider if: °· You cannot stop coughing. °· You are not urinating. °· You are urinating less than usual. °Get help right away if: °· You have trouble swallowing. °· You cannot eat or drink. °· You have throat or chest pain that gets worse. °· You are dizzy or light-headed. °· You faint. °· You have nausea or vomiting. °· You have chills. °· You have a fever. °· You have severe abdominal pain. °· You have black, tarry, or bloody stools. °This information is not intended to replace advice given to you by your health care provider. Make sure you discuss any questions you have with your health care provider. °Document Released: 04/30/2012 Document  Revised: 10/20/2015 Document Reviewed: 04/07/2015 °Elsevier Interactive Patient Education © 2018 [employer] ° °

## 2017-10-29 NOTE — Anesthesia Postprocedure Evaluation (Signed)
Anesthesia Post Note  Patient: [name]  Procedure(s) Performed: ENTEROSCOPY (N/A )     Patient location during evaluation: PACU Anesthesia Type: MAC Level of consciousness: awake and alert and oriented Pain management: pain level controlled Vital Signs Assessment: post-procedure vital signs reviewed and stable Respiratory status: spontaneous breathing, nonlabored ventilation and respiratory function stable Cardiovascular status: stable and blood pressure returned to baseline Postop Assessment: no apparent nausea or vomiting Anesthetic complications: no    Last Vitals:  Vitals:   10/29/17 1054 10/29/17 1100  BP: (!) 147/92 (!) 150/103  Pulse: 88 84  Resp: 11 15  SpO2: 98% 92%    Last Pain:  Vitals:   10/29/17 1100  TempSrc:   PainSc: 0-No pain                 [name]

## 2017-10-29 NOTE — H&P (Signed)
History:  This patient presents for endoscopic testing for iron deficiency anemia.  See details in 10/11/17 office note  [name] Referring physician: [name], MD  Past Medical History: Past Medical History:  Diagnosis Date  . Allergic rhinitis   . Anemia, iron deficiency 05/03/2011   2/2 GI AVMs - recieves iron infusions as well as periodic red blood cell transfusions  . Anxiety   . Arteriovenous malformation of duodenum 02/25/2015  . CARPAL TUNNEL RELEASE, HX OF 07/22/2008   Qualifier: Diagnosis of  By: Tomma Lightning MD, Claiborne Billings    . Colon polyps   . Diabetes mellitus without complication ([place])    type 2  . Difficulty sleeping    takes trazadone for sleep  . Diverticulosis   . GERD (gastroesophageal reflux disease)   . Hepatitis C    in SVR with Harvoni 12/2014-02/2015.   Marland Kitchen History of hernia repair 08/18/2012  . History of porphyria 11/30/2014   Porphyria cutanea tarda in setting of chronic Hepatitis C, frequent  IV iron infusions, and alcohol abuse   . Hypertension   . Peripheral vascular disease ([place])   . Stroke [facility]) 2010   no residual problems  . Substance use disorder    cocaine     Past Surgical History: Past Surgical History:  Procedure Laterality Date  . ABDOMINAL HYSTERECTOMY    . APPLICATION OF WOUND VAC N/A 06/21/2014   Procedure: APPLICATION OF WOUND VAC;  Surgeon: Coralie Keens, MD;  Location: [place];  Service: General;  Laterality: N/A;  . CARPAL TUNNEL RELEASE     rt hand  . ENTEROSCOPY N/A 12/04/2012   Procedure: ENTEROSCOPY;  Surgeon: Beryle Beams, MD;  Location: [facility];  Service: Endoscopy;  Laterality: N/A;  . ENTEROSCOPY N/A 12/18/2012   Procedure: ENTEROSCOPY;  Surgeon: Beryle Beams, MD;  Location: [facility];  Service: Endoscopy;  Laterality: N/A;  . ENTEROSCOPY N/A 02/25/2015   Procedure: ENTEROSCOPY;  Surgeon: Inda Castle, MD;  Location: [facility] ENDOSCOPY;  Service: Endoscopy;  Laterality: N/A;  . ESOPHAGOGASTRODUODENOSCOPY   05/05/2011   Procedure: ESOPHAGOGASTRODUODENOSCOPY (EGD);  Surgeon: Zenovia Jarred, MD;  Location: [facility];  Service: Gastroenterology;  Laterality: N/A;  Dr. Hilarie Fredrickson will do procedure for Dr. Benson Norway Saturday.  . ESOPHAGOGASTRODUODENOSCOPY  05/08/2011   Procedure: ESOPHAGOGASTRODUODENOSCOPY (EGD);  Surgeon: Beryle Beams;  Location: [facility];  Service: Endoscopy;  Laterality: N/A;  . ESOPHAGOGASTRODUODENOSCOPY  06/07/2011   Procedure: ESOPHAGOGASTRODUODENOSCOPY (EGD);  Surgeon: Beryle Beams, MD;  Location: [facility];  Service: Endoscopy;  Laterality: N/A;  . ESOPHAGOGASTRODUODENOSCOPY  12/20/2011   Procedure: ESOPHAGOGASTRODUODENOSCOPY (EGD);  Surgeon: Beryle Beams, MD;  Location: [facility];  Service: Endoscopy;  Laterality: N/A;  . EYE SURGERY Bilateral    cataracts  . FLEXIBLE SIGMOIDOSCOPY  12/21/2011   Procedure: FLEXIBLE SIGMOIDOSCOPY;  Surgeon: Beryle Beams, MD;  Location: [facility];  Service: Endoscopy;  Laterality: N/A;  . HOT HEMOSTASIS  06/07/2011   Procedure: HOT HEMOSTASIS (ARGON PLASMA COAGULATION/BICAP);  Surgeon: Beryle Beams, MD;  Location: [facility];  Service: Endoscopy;  Laterality: N/A;  . INCISIONAL HERNIA REPAIR N/A 06/01/2014   Procedure: ATTEMPTED LAPAROSCOPIC AND OPEN INCISIONAL HERNIA REPAIR WITH MESH;  Surgeon: Coralie Keens, MD;  Location: [place];  Service: General;  Laterality: N/A;  . INSERTION OF MESH N/A 06/01/2014   Procedure: INSERTION OF MESH;  Surgeon: Coralie Keens, MD;  Location: [place];  Service: General;  Laterality: N/A;  . LAPAROTOMY  02/16/2012   Procedure: EXPLORATORY LAPAROTOMY;  Surgeon: Edward Jolly, MD;  Location: [facility];  Service: General;  Laterality: N/A;  oversewing of anastomotic leak and rigid sigmoidoscopy  . LAPAROTOMY N/A 06/21/2014   Procedure: ABDOMINAL WOUND EXPLORATION;  Surgeon: Coralie Keens, MD;  Location: [place];  Service: General;  Laterality: N/A;  . LEG AMPUTATION ABOVE KNEE     left  . PARTIAL  COLECTOMY  02/15/2012   Procedure: PARTIAL COLECTOMY;  Surgeon: Harl Bowie, MD;  Location: [facility];  Service: General;  Laterality: N/A;  . TONSILLECTOMY      Allergies: Allergies  Allergen Reactions  . Ciprofloxacin Hives and Swelling  . Morphine And Related Other (See Comments)    Makes pt sad and paranoid  . Penicillins Hives and Swelling    Has patient had a PCN reaction causing immediate rash, facial/tongue/throat swelling, SOB or lightheadedness with hypotension:YES Has patient had a PCN reaction causing severe rash involving mucus membranes or skin necrosis:UNSURE Has patient had a PCN reaction that required hospitalization:YES Has patient had a PCN reaction occurring within the last 10 years:No If all of the above answers are "NO", then may proceed with Cephalosporin use.   . Codeine Hives and Swelling  . Lisinopril Cough    New onset dry cough after starting lisinopril. Resolved upon switching to losartan.  Shirlean Kelly [Ferumoxytol] Itching    Tolerated Feraheme 6/26 & 7/21 pre-medications prior to medication    Outpatient Meds: Current Facility-Administered Medications  Medication Dose Route Frequency Provider Last Rate Last Dose  . 0.9 %  sodium chloride infusion   Intravenous Continuous Danis, Estill Cotta III, MD      . lactated ringers infusion   Intravenous Continuous Nelida Meuse III, MD 10 mL/hr at 10/29/17 1012        ___________________________________________________________________ Objective   Exam:  BP 125/87   Pulse 100   Resp 15   SpO2 98%    CV: RRR without murmur, S1/S2, no JVD, no peripheral edema  Resp: clear to auscultation bilaterally, normal RR and effort noted  GI: soft, no tenderness, with active bowel sounds. No guarding or palpable organomegaly noted.  Neuro: awake, alert and oriented x 3. Normal gross motor function and fluent speech   Assessment:  IDA  Plan:  SB enteroscopy   Nelida Meuse III

## 2017-10-29 NOTE — Anesthesia Preprocedure Evaluation (Addendum)
Anesthesia Evaluation  Patient identified by MRN, date of birth, ID band Patient awake    Reviewed: Allergy & Precautions, NPO status , Patient's Chart, lab work & pertinent test results  Airway Mallampati: II  TM Distance: >3 FB Neck ROM: Full    Dental  (+) Edentulous Upper, Edentulous Lower   Pulmonary Current Smoker,    Pulmonary exam normal breath sounds clear to auscultation       Cardiovascular hypertension, Pt. on medications + Peripheral Vascular Disease  Normal cardiovascular exam Rhythm:Regular Rate:Normal     Neuro/Psych PSYCHIATRIC DISORDERS Anxiety Depression CVA, No Residual Symptoms    GI/Hepatic GERD  Medicated and Controlled,(+)     substance abuse  cocaine use and marijuana use, Hepatitis -, CLast Cocaine use 2 months ago Hx/o AVM duodenum Melena GI bleed   Endo/Other  diabetes, Poorly Controlled, Type 2, Insulin Dependent, Oral Hypoglycemic AgentsObesity  Renal/GU   negative genitourinary   Musculoskeletal  (+) Arthritis , Osteoarthritis,  S/P left AKA   Abdominal (+) + obese,   Peds  Hematology  (+) Blood dyscrasia, anemia , Porphyria cutanea tarda   Anesthesia Other Findings   Reproductive/Obstetrics                            Anesthesia Physical Anesthesia Plan  ASA: III  Anesthesia Plan: MAC   Post-op Pain Management:    Induction: Intravenous  PONV Risk Score and Plan: 1 and Ondansetron  Airway Management Planned: Natural Airway and Nasal Cannula  Additional Equipment:   Intra-op Plan:   Post-operative Plan:   Informed Consent: I have reviewed the patients History and Physical, chart, labs and discussed the procedure including the risks, benefits and alternatives for the proposed anesthesia with the patient or authorized representative who has indicated his/her understanding and acceptance.     Plan Discussed with: CRNA  Anesthesia Plan  Comments:         Anesthesia Quick Evaluation

## 2017-10-29 NOTE — Anesthesia Procedure Notes (Signed)
Date/Time: 10/29/2017 10:28 AM Performed by: Glory Buff, CRNA Oxygen Delivery Method: Nasal cannula

## 2017-10-29 NOTE — Interval H&P Note (Signed)
History and Physical Interval Note:  10/29/2017 10:25 AM  [name]  has presented today for surgery, with the diagnosis of Melena, anemia blood loss  The various methods of treatment have been discussed with the patient and family. After consideration of risks, benefits and other options for treatment, the patient has consented to  Procedure(s): ENTEROSCOPY (N/A) as a surgical intervention .  The patient's history has been reviewed, patient examined, no change in status, stable for surgery.  I have reviewed the patient's chart and labs.  Questions were answered to the patient's satisfaction.     Nelida Meuse III

## 2017-10-30 ENCOUNTER — Telehealth: Payer: Self-pay | Admitting: Hematology

## 2017-10-30 ENCOUNTER — Inpatient Hospital Stay: Payer: Medicare Other

## 2017-10-30 NOTE — Telephone Encounter (Signed)
Patient called to reschedule  °

## 2017-10-31 ENCOUNTER — Encounter (HOSPITAL_COMMUNITY): Payer: Self-pay | Admitting: Gastroenterology

## 2017-11-04 ENCOUNTER — Inpatient Hospital Stay: Payer: Medicare Other | Attending: Nurse Practitioner

## 2017-11-04 DIAGNOSIS — D500 Iron deficiency anemia secondary to blood loss (chronic): Secondary | ICD-10-CM | POA: Diagnosis not present

## 2017-11-04 DIAGNOSIS — Z79899 Other long term (current) drug therapy: Secondary | ICD-10-CM | POA: Insufficient documentation

## 2017-11-04 LAB — CBC WITH DIFFERENTIAL/PLATELET
BASOS PCT: 0 %
Basophils Absolute: 0 10*3/uL (ref 0.0–0.1)
Eosinophils Absolute: 0.2 10*3/uL (ref 0.0–0.5)
Eosinophils Relative: 4 %
HEMATOCRIT: 33.6 % — AB (ref 34.8–46.6)
HEMOGLOBIN: 10.4 g/dL — AB (ref 11.6–15.9)
LYMPHS ABS: 1.6 10*3/uL (ref 0.9–3.3)
LYMPHS PCT: 24 %
MCH: 30 pg (ref 25.1–34.0)
MCHC: 31 g/dL — AB (ref 31.5–36.0)
MCV: 96.8 fL (ref 79.5–101.0)
MONOS PCT: 9 %
Monocytes Absolute: 0.6 10*3/uL (ref 0.1–0.9)
NEUTROS ABS: 4.4 10*3/uL (ref 1.5–6.5)
NEUTROS PCT: 63 %
Platelets: 287 10*3/uL (ref 145–400)
RBC: 3.47 MIL/uL — ABNORMAL LOW (ref 3.70–5.45)
RDW: 16.7 % — ABNORMAL HIGH (ref 11.2–14.5)
WBC: 6.9 10*3/uL (ref 3.9–10.3)

## 2017-11-04 LAB — FERRITIN: Ferritin: 18 ng/mL (ref 9–269)

## 2017-11-05 ENCOUNTER — Telehealth: Payer: Self-pay

## 2017-11-05 ENCOUNTER — Telehealth: Payer: Self-pay | Admitting: Hematology

## 2017-11-05 NOTE — Telephone Encounter (Signed)
Scheduled appt per 6/11 sch message - pt is aware of appt added.   

## 2017-11-05 NOTE — Telephone Encounter (Signed)
-----   Message from Truitt Merle, MD sent at 11/04/2017 11:38 PM EDT ----- Please let pt know her lab result, given her low ferritin and anemia, I will set up iv feraheme once next week, thanks  Truitt Merle  11/04/2017

## 2017-11-05 NOTE — Telephone Encounter (Signed)
Per Dr. Burr Medico notified patient ferritin is low and anemic, needs IV iron transfusion once next week.  Will send scheduling message and include that the patient needs 3 days notice to arrange transportation.  Patient verbalized an understanding.

## 2017-11-06 DIAGNOSIS — G933 Postviral fatigue syndrome: Secondary | ICD-10-CM | POA: Diagnosis not present

## 2017-11-15 ENCOUNTER — Inpatient Hospital Stay: Payer: Medicare Other

## 2017-11-15 VITALS — BP 145/83 | HR 96 | Temp 99.7°F | Resp 16

## 2017-11-15 DIAGNOSIS — D500 Iron deficiency anemia secondary to blood loss (chronic): Secondary | ICD-10-CM

## 2017-11-15 DIAGNOSIS — Z79899 Other long term (current) drug therapy: Secondary | ICD-10-CM | POA: Diagnosis not present

## 2017-11-15 DIAGNOSIS — K922 Gastrointestinal hemorrhage, unspecified: Secondary | ICD-10-CM

## 2017-11-15 MED ORDER — FERUMOXYTOL INJECTION 510 MG/17 ML
510.0000 mg | Freq: Once | INTRAVENOUS | Status: AC
  Administered 2017-11-15: 510 mg via INTRAVENOUS
  Filled 2017-11-15: qty 17

## 2017-11-15 MED ORDER — FAMOTIDINE IN NACL 20-0.9 MG/50ML-% IV SOLN
20.0000 mg | Freq: Once | INTRAVENOUS | Status: AC
  Administered 2017-11-15: 20 mg via INTRAVENOUS

## 2017-11-15 MED ORDER — DIPHENHYDRAMINE HCL 50 MG/ML IJ SOLN
50.0000 mg | Freq: Once | INTRAMUSCULAR | Status: AC
  Administered 2017-11-15: 50 mg via INTRAVENOUS

## 2017-11-15 MED ORDER — FAMOTIDINE IN NACL 20-0.9 MG/50ML-% IV SOLN
INTRAVENOUS | Status: AC
  Filled 2017-11-15: qty 50

## 2017-11-15 MED ORDER — DIPHENHYDRAMINE HCL 50 MG/ML IJ SOLN
INTRAMUSCULAR | Status: AC
  Filled 2017-11-15: qty 1

## 2017-11-19 DIAGNOSIS — Z89612 Acquired absence of left leg above knee: Secondary | ICD-10-CM | POA: Diagnosis not present

## 2017-11-20 ENCOUNTER — Other Ambulatory Visit: Payer: Medicare Other

## 2017-11-22 ENCOUNTER — Ambulatory Visit
Admission: RE | Admit: 2017-11-22 | Discharge: 2017-11-22 | Disposition: A | Discharge status: Home / Self Care | Payer: Medicare Other | Source: Ambulatory Visit | Attending: Family Medicine | Admitting: Family Medicine

## 2017-11-22 DIAGNOSIS — Z1231 Encounter for screening mammogram for malignant neoplasm of breast: Secondary | ICD-10-CM

## 2017-11-27 ENCOUNTER — Other Ambulatory Visit: Payer: Self-pay | Admitting: Internal Medicine

## 2017-11-27 DIAGNOSIS — I10 Essential (primary) hypertension: Secondary | ICD-10-CM

## 2017-11-27 DIAGNOSIS — E1142 Type 2 diabetes mellitus with diabetic polyneuropathy: Secondary | ICD-10-CM

## 2017-12-02 ENCOUNTER — Inpatient Hospital Stay: Payer: Medicare Other

## 2017-12-02 ENCOUNTER — Telehealth: Payer: Self-pay | Admitting: Hematology

## 2017-12-02 ENCOUNTER — Encounter: Payer: Self-pay | Admitting: Nurse Practitioner

## 2017-12-02 ENCOUNTER — Other Ambulatory Visit: Payer: Self-pay | Admitting: *Deleted

## 2017-12-02 ENCOUNTER — Inpatient Hospital Stay: Payer: Medicare Other | Attending: Nurse Practitioner | Admitting: Nurse Practitioner

## 2017-12-02 VITALS — BP 129/69 | HR 97 | Temp 98.9°F | Resp 18 | Ht 62.0 in | Wt 167.0 lb

## 2017-12-02 DIAGNOSIS — Z794 Long term (current) use of insulin: Secondary | ICD-10-CM | POA: Diagnosis not present

## 2017-12-02 DIAGNOSIS — Z89612 Acquired absence of left leg above knee: Secondary | ICD-10-CM | POA: Diagnosis not present

## 2017-12-02 DIAGNOSIS — I739 Peripheral vascular disease, unspecified: Secondary | ICD-10-CM | POA: Insufficient documentation

## 2017-12-02 DIAGNOSIS — K31819 Angiodysplasia of stomach and duodenum without bleeding: Secondary | ICD-10-CM | POA: Insufficient documentation

## 2017-12-02 DIAGNOSIS — I10 Essential (primary) hypertension: Secondary | ICD-10-CM | POA: Diagnosis not present

## 2017-12-02 DIAGNOSIS — M25561 Pain in right knee: Secondary | ICD-10-CM | POA: Diagnosis not present

## 2017-12-02 DIAGNOSIS — K219 Gastro-esophageal reflux disease without esophagitis: Secondary | ICD-10-CM | POA: Insufficient documentation

## 2017-12-02 DIAGNOSIS — D500 Iron deficiency anemia secondary to blood loss (chronic): Secondary | ICD-10-CM | POA: Insufficient documentation

## 2017-12-02 DIAGNOSIS — F329 Major depressive disorder, single episode, unspecified: Secondary | ICD-10-CM | POA: Insufficient documentation

## 2017-12-02 DIAGNOSIS — E1151 Type 2 diabetes mellitus with diabetic peripheral angiopathy without gangrene: Secondary | ICD-10-CM | POA: Insufficient documentation

## 2017-12-02 DIAGNOSIS — K922 Gastrointestinal hemorrhage, unspecified: Secondary | ICD-10-CM | POA: Insufficient documentation

## 2017-12-02 DIAGNOSIS — B182 Chronic viral hepatitis C: Secondary | ICD-10-CM | POA: Insufficient documentation

## 2017-12-02 DIAGNOSIS — Z79899 Other long term (current) drug therapy: Secondary | ICD-10-CM | POA: Diagnosis not present

## 2017-12-02 DIAGNOSIS — R5383 Other fatigue: Secondary | ICD-10-CM | POA: Diagnosis not present

## 2017-12-02 DIAGNOSIS — F17210 Nicotine dependence, cigarettes, uncomplicated: Secondary | ICD-10-CM | POA: Diagnosis not present

## 2017-12-02 DIAGNOSIS — Z86010 Personal history of colonic polyps: Secondary | ICD-10-CM | POA: Insufficient documentation

## 2017-12-02 DIAGNOSIS — E119 Type 2 diabetes mellitus without complications: Secondary | ICD-10-CM | POA: Diagnosis not present

## 2017-12-02 DIAGNOSIS — Z7982 Long term (current) use of aspirin: Secondary | ICD-10-CM | POA: Diagnosis not present

## 2017-12-02 LAB — CBC WITH DIFFERENTIAL/PLATELET
Basophils Absolute: 0 10*3/uL (ref 0.0–0.1)
Basophils Relative: 0 %
Eosinophils Absolute: 0.1 10*3/uL (ref 0.0–0.5)
Eosinophils Relative: 2 %
HCT: 29.9 % — ABNORMAL LOW (ref 34.8–46.6)
HEMOGLOBIN: 9.3 g/dL — AB (ref 11.6–15.9)
LYMPHS ABS: 1 10*3/uL (ref 0.9–3.3)
Lymphocytes Relative: 19 %
MCH: 31.1 pg (ref 25.1–34.0)
MCHC: 31.1 g/dL — AB (ref 31.5–36.0)
MCV: 100 fL (ref 79.5–101.0)
MONOS PCT: 16 %
Monocytes Absolute: 0.8 10*3/uL (ref 0.1–0.9)
NEUTROS ABS: 3.1 10*3/uL (ref 1.5–6.5)
NEUTROS PCT: 63 %
PLATELETS: 293 10*3/uL (ref 145–400)
RBC: 2.99 MIL/uL — ABNORMAL LOW (ref 3.70–5.45)
RDW: 18.9 % — AB (ref 11.2–14.5)
WBC: 5 10*3/uL (ref 3.9–10.3)

## 2017-12-02 LAB — FERRITIN: FERRITIN: 89 ng/mL (ref 11–307)

## 2017-12-02 NOTE — Progress Notes (Signed)
[name]  [phone] [phone]  Clinic Follow up Note   Patient Care Team: [name], MD as PCP - Evalina Field, MD as Consulting Physician (Ophthalmology) Coralie Keens, MD as Consulting Physician (General Surgery) Truitt Merle, MD as Consulting Physician (Hematology) Comer, Okey Regal, MD as Consulting Physician (Infectious Diseases) Date of service: 12/02/17   DIAGNOSIS:  Iron deficiency anemia due to chronic blood loss  Gastrointestinal hemorrhage, unspecified gastrointestinal hemorrhage type.   PROBLEM LIST:  1. Recurrent iron-deficiency anemia secondary to gastrointestinal bleeding (as noted above). [name] also has required red cell transfusions in the past, most recently early December 2012. She underwent a small bowel enteroscopy by Dr. Dan Humphreys at [facility] on 08/13/2011. The enteroscope was passed up to 6 feet into the jejunum. He saw multiple AVMs, which were ablated. Two of the large AVMs bled during APC. These were in the 4th part of the duodenum very close to the ligament of Treitz. The remainder of the AVMs were in the proximal jejunum. In addition to the small bowel AV malformations, which were treated with APC, he saw some small esophageal varices which were not bleeding and also evidence of mild portal hypertensive gastropathy.  2. History of duodenal arteriovenous malformation.  3. History of hepatitis C infection.  4. Gastroesophageal reflux disease.  5. Depression.  6. Hypertension.  7. History of right Bell's palsy.  8. Status post left above-knee amputation secondary to necrotizing fasciitis 03/06/2009.  9. Neurodermatitis.  10. Previous history of alcohol usage.  11. Admission to the hospital from 12/20/2011 through 12/25/2011 for hematochezia felt to be secondary to a diverticular bleed, requiring 2 units of packed red cells.  12. Diverticulosis.  13.  Elevated alpha fetoprotein, 72.3 on 11/19/2011 and 76.0 on 01/11/2012.  14. Abnormal MRI of the liver from 11/28/2011 showing diffuse and markedly low signal intensity throughout the liver as well as diffuse low signal intensity in the adrenal glands. 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 There were no findings for cirrhosis, portal hypertension, splenomegaly or ascites, not were there any worrisome enhancing liver lesions.  15. Systolic ejection murmur.   CHIEF COMPLAIN: Follow up anemia  PRIOR TREATMENT: The patient has been heavily dependent upon IV Feraheme. Over the past year or so she has been receiving about 1 Feraheme infusion monthly on average. The patient's most recent infusions of IV Feraheme 510 mg occurred on the following dates: 06/05/2011, 07/03/2011, 07/31/2011, 08/17/2011, 08/28/2011, 10/02/2011 01/30/2012, 06/06/2012 and 09/10/2012, 05/2013, 07/2013, 08/2013, 09/2013, 11/12/2013, [01/15/2014 and 03/16/2014 at 10230mg ], 09/16/2014 and 09/23/2014, 11/2014 , 02/24/2015,  04/07/2015, 08/03/2015, 10/03/2015, 12/16/2015, 01/2016 and 02/27/2016, 09/07/16, 01/31/17, 07/15/17, 08/22/17, 11/15/17  CURRENT TREATMENT: Feraheme prn   INTERVAL HISTORY: [name] returns for follow up. She was last seen by Dr. Burr Medico on 08/29/17. In the interim she was given stool card, per patient had heme positive stools in 09/2017 and was instructed by PCP to f/u with GI. She underwent small bowel enteroscopy on 10/29/17 per Dr. Loletha Carrow, exam was unremarkable, no SB AVMs seen. She underwent abdominal US and AFP testing. She received IV Feraheme infusion on 11/15/17 for low ferritin and anemia.   Today she notes improved fatigue and feels well after IV iron infusion. Continues ferrous sulfate 1 tab daily. She is able to be social and complete all ADLs. Appetite is improving. Drinks 1-2 glucerna per day. Checking BG once daily now, has been <130 per patient. Denies  hematochezia, epistaxis, or  hematuria, palpitations, cough, dyspnea, chest pain, n/v/c/d, fever, chills, or pain.   REVIEW OF SYSTEMS:   Constitutional: Denies fevers, chills (+) weight loss (+) fatigue, improved  Eyes: Denies blurriness of vision Ears, nose, mouth, throat, and face: Denies mucositis or sore throat Respiratory: Denies cough, dyspnea or wheezes Cardiovascular: Denies palpitation, chest discomfort or lower extremity swelling Gastrointestinal:  Denies nausea, vomiting, constipation, diarrhea, heartburn or change in bowel habits Skin: Denies new skin rashs Lymphatics: Denies new lymphadenopathy or easy bruising Neurological:Denies new weaknesses (+) neuropathy  Behavioral/Psych: Mood is stable, no new changes  MSK: (+) left AKA  All other systems were reviewed with the patient and are negative.  MEDICAL HISTORY:  Past Medical History:  Diagnosis Date  . Allergic rhinitis   . Anemia, iron deficiency 05/03/2011   2/2 GI AVMs - recieves iron infusions as well as periodic red blood cell transfusions  . Anxiety   . Arteriovenous malformation of duodenum 02/25/2015  . CARPAL TUNNEL RELEASE, HX OF 07/22/2008   Qualifier: Diagnosis of  By: Tomma Lightning MD, Claiborne Billings    . Colon polyps   . Diabetes mellitus without complication ([place])    type 2  . Difficulty sleeping    takes trazadone for sleep  . Diverticulosis   . GERD (gastroesophageal reflux disease)   . Hepatitis C    in SVR with Harvoni 12/2014-02/2015.   Marland Kitchen History of hernia repair 08/18/2012  . History of porphyria 11/30/2014   Porphyria cutanea tarda in setting of chronic Hepatitis C, frequent  IV iron infusions, and alcohol abuse   . Hypertension   . Peripheral vascular disease ([place])   . Stroke [id number]) 2010   no residual problems  . Substance use disorder    cocaine    SURGICAL HISTORY: Past Surgical History:  Procedure Laterality Date  . ABDOMINAL HYSTERECTOMY    . APPLICATION OF WOUND VAC N/A 06/21/2014   Procedure: APPLICATION OF WOUND VAC;   Surgeon: Coralie Keens, MD;  Location: [place];  Service: General;  Laterality: N/A;  . CARPAL TUNNEL RELEASE     rt hand  . ENTEROSCOPY N/A 12/04/2012   Procedure: ENTEROSCOPY;  Surgeon: Beryle Beams, MD;  Location: [facility];  Service: Endoscopy;  Laterality: N/A;  . ENTEROSCOPY N/A 12/18/2012   Procedure: ENTEROSCOPY;  Surgeon: Beryle Beams, MD;  Location: [facility];  Service: Endoscopy;  Laterality: N/A;  . ENTEROSCOPY N/A 02/25/2015   Procedure: ENTEROSCOPY;  Surgeon: Inda Castle, MD;  Location: [facility] END[facility]COPY;  Service: Endoscopy;  Laterality: N/A;  . ENTEROSCOPY N/A 10/29/2017   Procedure: ENTEROSCOPY;  Surgeon: Doran Stabler, MD;  Location: [facility];  Service: Gastroenterology;  Laterality: N/A;  . ESOPHAGOGASTRODUODENOSCOPY  05/05/2011   Procedure: ESOPHAGOGASTRODUODENOSCOPY (EGD);  Surgeon: Zenovia Jarred, MD;  Location: [facility];  Service: Gastroenterology;  Laterality: N/A;  Dr. Hilarie Fredrickson will do procedure for Dr. Benson Norway Saturday.  . ESOPHAGOGASTRODUODENOSCOPY  05/08/2011   Procedure: ESOPHAGOGASTRODUODENOSCOPY (EGD);  Surgeon: Beryle Beams;  [facility]: [facility];  Service: Endoscopy;  Laterality: N/A;  . ESOPHAGOGASTRODUODENOSCOPY  06/07/2011   Procedure: ESOPHAGOGASTRODUODENOSCOPY (EGD);  Surgeon: Beryle Beams, MD;  Location: [facility];  Service: Endoscopy;  Laterality: N/A;  . ESOPHAGOGASTRODUODENOSCOPY  12/20/2011   Procedure: ESOPHAGOGASTRODUODENOSCOPY (EGD);  Surgeon: Beryle Beams, MD;  Location: [facility];  Service: Endoscopy;  Laterality: N/A;  . EYE SURGERY Bilateral    cataracts  . FLEXIBLE SIGMOIDOSCOPY  12/21/2011   Procedure: FLEXIBLE SIGMOIDOSCOPY;  Surgeon: Tory Emerald  Benson Norway, MD;  Location: [facility];  Service: Endoscopy;  Laterality: N/A;  . HOT HEMOSTASIS  06/07/2011   Procedure: HOT HEMOSTASIS (ARGON PLASMA COAGULATION/BICAP);  Surgeon: Beryle Beams, MD;  Location: [facility];  Service: Endoscopy;  Laterality: N/A;  . INCISIONAL HERNIA  REPAIR N/A 06/01/2014   Procedure: ATTEMPTED LAPAROSCOPIC AND OPEN INCISIONAL HERNIA REPAIR WITH MESH;  Surgeon: Coralie Keens, MD;  Location: [place];  Service: General;  Laterality: N/A;  . INSERTION OF MESH N/A 06/01/2014   Procedure: INSERTION OF MESH;  Surgeon: Coralie Keens, MD;  Location: [place];  Service: General;  Laterality: N/A;  . LAPAROTOMY  02/16/2012   Procedure: EXPLORATORY LAPAROTOMY;  Surgeon: Edward Jolly, MD;  Location: [facility];  Service: General;  Laterality: N/A;  oversewing of anastomotic leak and rigid sigmoidoscopy  . LAPAROTOMY N/A 06/21/2014   Procedure: ABDOMINAL WOUND EXPLORATION;  Surgeon: Coralie Keens, MD;  Location: [place];  Service: General;  Laterality: N/A;  . LEG AMPUTATION ABOVE KNEE     left  . PARTIAL COLECTOMY  02/15/2012   Procedure: PARTIAL COLECTOMY;  Surgeon: Harl Bowie, MD;  Location: [facility];  Service: General;  Laterality: N/A;  . TONSILLECTOMY      I have reviewed the social history and family history with the patient and they are unchanged from previous note.  ALLERGIES:  is allergic to ciprofloxacin; morphine and related; penicillins; codeine; lisinopril; and feraheme [ferumoxytol].  MEDICATIONS:  Current Outpatient Medications  Medication Sig Dispense Refill  . ACCU-CHEK FASTCLIX LANCETS MISC Use to test blood glucose 3 times daily. 100 each 11  . allopurinol (ZYLOPRIM) 300 MG tablet Take 1 tablet (300 mg total) by mouth daily. 30 tablet 4  . aspirin EC 81 MG tablet Take 1 tablet (81 mg total) by mouth daily. 30 tablet 1  . cetirizine (ZYRTEC) 10 MG tablet TAKE 1 TABLET BY MOUTH DAILY 90 tablet 1  . cholecalciferol (VITAMIN D) 1000 units tablet Take 1 tablet (1,000 Units total) by mouth daily. 100 tablet 2  . colchicine 0.6 MG tablet TAKE ONE TABLET BY MOUTH DAILY 90 tablet 0  . DILT-XR 180 MG 24 hr capsule TAKE 1 CAPSULE BY MOUTH ONCE DAILY 30 capsule 2  . feeding supplement, GLUCERNA SHAKE, (GLUCERNA SHAKE) LIQD Take 237  mLs by mouth 3 (three) times daily between meals. (Patient taking differently: Take 237 mLs by mouth 2 (two) times daily between meals. ) 90 Can 0  . ferrous sulfate 325 (65 FE) MG EC tablet Take 1 tablet (325 mg total) by mouth daily after breakfast. 30 tablet 1  . FLUoxetine (PROZAC) 20 MG capsule TAKE 3 CAPSULES BY MOUTH DAILY 90 capsule 2  . gabapentin (NEURONTIN) 400 MG capsule TAKE 2 CAPSULES BY MOUTH THREE TIMES A DAY 180 capsule 5  . glucose blood (ACCU-CHEK GUIDE) test strip Check blood sugar 3 times a day 100 each 12  . Insulin Glargine (LANTUS) 100 UNIT/ML Solostar Pen Inject 10 Units into the skin daily at 10 pm. 15 mL 2  . Insulin Pen Needle (UNIFINE PENTIPS) 32G X 4 MM MISC Use to inject insulin once a day. The patient is insulin requiring, ICD 10 code E11.9. The patient injects 1 times per day. 100 each 2  . losartan (COZAAR) 100 MG tablet TAKE 1 TABLET BY MOUTH EVERY DAY 90 tablet 3  . metFORMIN (GLUCOPHAGE) 1000 MG tablet Take 1 tablet (1,000 mg total) by mouth 2 (two) times daily with a meal. 180 tablet 3  .  omeprazole (PRILOSEC) 20 MG capsule TAKE 1 CAPSULE BY MOUTH EVERY DAY **STOP NEXIUM** 30 capsule 5  . Potassium Chloride ER 20 MEQ TBCR Take 20 mEq by mouth daily. 90 tablet 1  . tobramycin-dexamethasone (TOBRADEX) ophthalmic solution Place 1 drop into the left eye 4 (four) times daily.  1  . traZODone (DESYREL) 50 MG tablet TAKE ONE TABLET BY MOUTH AT BEDTIME 30 tablet 2  . vitamin C (ASCORBIC ACID) 500 MG tablet Take 500 mg by mouth every morning.    . cromolyn (OPTICROM) 4 % ophthalmic solution INSTILL 1 DROP INTO BOTH EYES FOUR TIMES A DAY  99   No current facility-administered medications for this visit.     PHYSICAL EXAMINATION: ECOG PERFORMANCE STATUS: 1 - Symptomatic but completely ambulatory  Vitals:   12/02/17 1423  BP: 129/69  Pulse: 97  Resp: 18  Temp: 98.9 F (37.2 C)  SpO2: 96%   Filed Weights   12/02/17 1423  Weight: 167 lb (75.8 kg)     GENERAL:alert, no distress and comfortable SKIN: no rashes or significant lesions; scattered hyperpigmentation to arms bilaterally reflecting previous rash  EYES: normal, Conjunctiva are pink and non-injected, sclera clear OROPHARYNX:no thrush or ulcers  LYMPH:  no palpable cervical or supraclavicular lymphadenopathy LUNGS: clear to auscultation with normal breathing effort HEART: regular rate & rhythm with murmur, no right lower extremity edema ABDOMEN:abdomen soft, non-tender and normal bowel sounds. No palpable hepatomegaly  Musculoskeletal:no cyanosis of digits and no clubbing. Left AKA with prosthesis  NEURO: alert & oriented x 3 with fluent speech, no focal motor/sensory deficits  LABORATORY DATA:  I have reviewed the data as listed CBC Latest Ref Rng & Units 12/02/2017 11/04/2017 10/09/2017  WBC 3.9 - 10.3 K/uL 5.0 6.9 7.3  Hemoglobin 11.6 - 15.9 g/dL 9.3(L) 10.4(L) 11.0(L)  Hematocrit 34.8 - 46.6 % 29.9(L) 33.6(L) 36.4  Platelets 145 - 400 K/uL 293 287 269     CMP Latest Ref Rng & Units 07/15/2017 01/31/2017 09/25/2016  Glucose 70 - 140 mg/dL 120 104 132(H)  BUN 7 - 26 mg/dL 29(H) 14.0 14  Creatinine 0.60 - 1.10 mg/dL 1.13(H) 0.9 0.75  Sodium 136 - 145 mmol/L 138 141 143  Potassium 3.5 - 5.1 mmol/L 4.7 4.4 3.7  Chloride 98 - 109 mmol/L 107 - 104  CO2 22 - 29 mmol/L 23 22 22   Calcium 8.4 - 10.4 mg/dL 9.4 9.0 7.7(L)  Total Protein 6.4 - 8.3 g/dL 7.0 7.1 -  Total Bilirubin 0.2 - 1.2 mg/dL <0.2(L) 0.26 -  Alkaline Phos 40 - 150 U/L 66 71 -  AST 5 - 34 U/L 39(H) 25 -  ALT 0 - 55 U/L 30 21 -   Results for ELIANNA, WINDOM (MRN [id number]) as of 08/28/2017 15:58  Ref. Range 11/29/2016 14:27 01/31/2017 14:16 07/15/2017 13:38 08/12/2017 14:49 11/04/2017 12/02/2017  Ferritin Latest Ref Range: 9 - 269 ng/mL 29 21 14  32 18 89    RADIOGRAPHIC STUDIES: I have personally reviewed the radiological images as listed and agreed with the findings in the report. No results found.   ASSESSMENT &  PLAN: 67 y.o. female with a history of   1. Iron deficiency anemia secondary to GI loss, with aspects of Anemia of Chronic Disease  2. Porphyria cutaneous tarda (PCT), resolved  3. Chronic Hep C infection 4. DM2 5. Left AKA 6. Smoking cessation  7. Right knee pain, swelling   Ms. Lory appears stable. She received last IV Feraheme on 11/15/17 with improvement in  her symptoms. Ferritin responded to 89, from 18 on 11/04/17. Iron and TIBC are adequate in normal range. I do not recommend additional IV Feraheme for now. I recommend she increase oral iron to 1 tab BID if she tolerates. Small bowel enteroscopy on 10/29/17 was unremarkable, no AVMs. However, she has worsening anemia, Hgb 9.3, from 10.4 on 6/10, may have some component of chronic disease. Given weight loss and worsening anemia, I recommend she undergo colonoscopy to r/o other source of bleeding vs malignancy. I will send a message to Dr. Loletha Carrow and PCP.   She had normal liver US and AFP in May 2019, no evidence of HCC.   Will continue to check CBC and iron studies monthly with IV Feraheme as needed; f/u in 3-4 months.   PLAN -Labs reviewed -increase oral iron to 1 tab BID, no IV Feraheme needed currently -Message to Dr. Loletha Carrow and PCP re: colonoscopy  -Labs monthly x4 -F/u with Dr. Burr Medico in 3-4 months    Orders Placed This Encounter  Procedures  . Iron and TIBC    Standing Status:   Standing    Number of Occurrences:   50    Standing Expiration Date:   12/02/2021   All questions were answered. The patient knows to call the clinic with any problems, questions or concerns. No barriers to learning was detected. I spent 20 minutes counseling the patient face to face. The total time spent in the appointment was 25 minutes and more than 50% was on counseling and review of test results     Patricia Feeling, NP 12/04/17

## 2017-12-02 NOTE — Telephone Encounter (Signed)
Appointments scheduled AVS/Calendar printed per 7/8 los °

## 2017-12-03 LAB — IRON AND TIBC
Iron: 102 ug/dL (ref 41–142)
SATURATION RATIOS: 25 % (ref 21–57)
TIBC: 411 ug/dL (ref 236–444)
UIBC: 309 ug/dL

## 2017-12-04 ENCOUNTER — Encounter: Payer: Self-pay | Admitting: Nurse Practitioner

## 2017-12-05 DIAGNOSIS — G933 Postviral fatigue syndrome: Secondary | ICD-10-CM | POA: Diagnosis not present

## 2017-12-06 DIAGNOSIS — E119 Type 2 diabetes mellitus without complications: Secondary | ICD-10-CM | POA: Diagnosis not present

## 2017-12-20 ENCOUNTER — Other Ambulatory Visit: Payer: Self-pay | Admitting: Hematology

## 2017-12-20 DIAGNOSIS — Z8639 Personal history of other endocrine, nutritional and metabolic disease: Secondary | ICD-10-CM

## 2017-12-23 ENCOUNTER — Telehealth: Payer: Self-pay | Admitting: Hematology

## 2017-12-25 ENCOUNTER — Telehealth: Payer: Self-pay | Admitting: Hematology

## 2017-12-26 ENCOUNTER — Other Ambulatory Visit: Payer: Self-pay | Admitting: Internal Medicine

## 2017-12-26 DIAGNOSIS — IMO0001 Reserved for inherently not codable concepts without codable children: Secondary | ICD-10-CM

## 2017-12-26 DIAGNOSIS — E1165 Type 2 diabetes mellitus with hyperglycemia: Principal | ICD-10-CM

## 2017-12-26 DIAGNOSIS — Z794 Long term (current) use of insulin: Principal | ICD-10-CM

## 2017-12-26 DIAGNOSIS — M109 Gout, unspecified: Secondary | ICD-10-CM

## 2017-12-27 NOTE — Telephone Encounter (Signed)
Next appt scheduled  9/4 with PCP.

## 2017-12-30 ENCOUNTER — Other Ambulatory Visit: Payer: Self-pay

## 2017-12-30 ENCOUNTER — Ambulatory Visit (INDEPENDENT_AMBULATORY_CARE_PROVIDER_SITE_OTHER): Payer: Medicare Other | Admitting: Internal Medicine

## 2017-12-30 ENCOUNTER — Encounter: Payer: Self-pay | Admitting: Internal Medicine

## 2017-12-30 VITALS — BP 134/82 | HR 112 | Temp 99.0°F | Ht 60.0 in

## 2017-12-30 DIAGNOSIS — M10071 Idiopathic gout, right ankle and foot: Secondary | ICD-10-CM

## 2017-12-30 DIAGNOSIS — D500 Iron deficiency anemia secondary to blood loss (chronic): Secondary | ICD-10-CM | POA: Diagnosis not present

## 2017-12-30 DIAGNOSIS — E11610 Type 2 diabetes mellitus with diabetic neuropathic arthropathy: Secondary | ICD-10-CM | POA: Diagnosis not present

## 2017-12-30 DIAGNOSIS — I10 Essential (primary) hypertension: Secondary | ICD-10-CM

## 2017-12-30 DIAGNOSIS — B1920 Unspecified viral hepatitis C without hepatic coma: Secondary | ICD-10-CM

## 2017-12-30 NOTE — Progress Notes (Signed)
   CC: Right leg swelling  HPI:  [name] is a 67 y.o. female with essential hypertension, diabetes mellitus type 2, right foot gout, hepatitis C, and iron deficiency anemia due to chronic blood loss who presents for right leg swelling. Please see problem based charting for evaluation, assessment, and plan.  Past Medical History:  Diagnosis Date  . Allergic rhinitis   . Anemia, iron deficiency 05/03/2011   2/2 GI AVMs - recieves iron infusions as well as periodic red blood cell transfusions  . Anxiety   . Arteriovenous malformation of duodenum 02/25/2015  . CARPAL TUNNEL RELEASE, HX OF 07/22/2008   Qualifier: Diagnosis of  By: Tomma Lightning MD, Claiborne Billings    . Colon polyps   . Diabetes mellitus without complication ([place])    type 2  . Difficulty sleeping    takes trazadone for sleep  . Diverticulosis   . GERD (gastroesophageal reflux disease)   . Hepatitis C    in SVR with Harvoni 12/2014-02/2015.   Marland Kitchen History of hernia repair 08/18/2012  . History of porphyria 11/30/2014   Porphyria cutanea tarda in setting of chronic Hepatitis C, frequent  IV iron infusions, and alcohol abuse   . Hypertension   . Peripheral vascular disease ([place])   . Stroke [facility]) 2010   no residual problems  . Substance use disorder    cocaine   Review of Systems:   Right foot pain and swelling, numbness and tingling in the lower extremities  Physical Exam:  Vitals:   12/30/17 1446  BP: 134/82  Pulse: (!) 112  Temp: 99 F (37.2 C)  TempSrc: Oral  SpO2: 98%  Height: 5' (1.524 m)   Physical Exam  Constitutional: She appears well-developed and well-nourished. No distress.  HENT:  Head: Normocephalic and atraumatic.  Cardiovascular: Normal rate, regular rhythm and normal heart sounds.  Respiratory: Breath sounds normal. No respiratory distress. She has no wheezes.  GI: Soft. Bowel sounds are normal. She exhibits distension. There is no tenderness.  Musculoskeletal: She exhibits edema (Dorsal aspect of  right foot) and tenderness (Right foot).  Medial arch of right foot has collapsed  Neurological: She is alert.  Skin: She is not diaphoretic. No erythema.  No open wounds or drainage noted on right foot  Psychiatric: She has a normal mood and affect. Her behavior is normal. Judgment and thought content normal.     Assessment & Plan:   See Encounters Tab for problem based charting.  Acute Charcot foot, right Patient states that she has been having acute onset right foot pain that started Saturday.  The patient describes the pain as 8/10 in intensity, burning in nature and constantly present.  She states that she has been having a difficult time placing her foot flat.  She notes that the pain is most worse on the medial plantar arch, calcaneus, metatarsal region.  However, sometimes the up her right calf.  Assessment and plan The patient has swelling noted on the right dorsal foot, collapsed medial plantar arch.  She does not have any open wounds or drainage noted.  There is no warmth to touch or erythema noted.  Patient is likely to have acute Charcot foot secondary to diabetic neuropathy.  -Placed patient in Charcot boot -Recommended patient use Tylenol and gabapentin for pain relief -Referral to podiatry made-consider radiographs   Patient discussed with Dr. Evette Doffing

## 2017-12-30 NOTE — Patient Instructions (Addendum)
It was a pleasure to see you today [name]. Please make the following changes:  -Please make sure that you wear your boot  -Please follow up with foot doctor -Please use tylenol for pain   If you have any questions or concerns, please call our clinic at [phone] between 9am-5pm and after hours call [phone] and ask for the internal medicine resident on call. If you feel you are having a medical emergency please call 911.   Thank you, we look forward to help you remain healthy!  Lars Mage, MD Internal Medicine PGY2

## 2017-12-30 NOTE — Assessment & Plan Note (Signed)
Patient states that she has been having acute onset right foot pain that started Saturday.  The patient describes the pain as 8/10 in intensity, burning in nature and constantly present.  She states that she has been having a difficult time placing her foot flat.  She notes that the pain is most worse on the medial plantar arch, calcaneus, metatarsal region.  However, sometimes the up her right calf.  Assessment and plan The patient has swelling noted on the right dorsal foot, collapsed medial plantar arch.  She does not have any open wounds or drainage noted.  There is no warmth to touch or erythema noted.  Patient is likely to have acute Charcot foot secondary to diabetic neuropathy.  -Placed patient in Charcot boot -Recommended patient use Tylenol and gabapentin for pain relief -Referral to podiatry made-consider radiographs

## 2017-12-31 MED FILL — LANTUS SOLOSTAR 100 UNITS/M: 100 | 60 days supply | Qty: 15 | Fill #2

## 2017-12-31 NOTE — Progress Notes (Signed)
Internal Medicine Clinic Attending  I saw and evaluated the patient.  I personally confirmed the key portions of the history and exam documented by Dr. Chundi and I reviewed pertinent patient test results.  The assessment, diagnosis, and plan were formulated together and I agree with the documentation in the resident's note. 

## 2018-01-01 ENCOUNTER — Encounter: Payer: Medicare Other | Admitting: Internal Medicine

## 2018-01-02 ENCOUNTER — Inpatient Hospital Stay: Payer: Medicare Other | Attending: Nurse Practitioner

## 2018-01-02 DIAGNOSIS — B182 Chronic viral hepatitis C: Secondary | ICD-10-CM | POA: Insufficient documentation

## 2018-01-02 DIAGNOSIS — Z79899 Other long term (current) drug therapy: Secondary | ICD-10-CM | POA: Insufficient documentation

## 2018-01-02 DIAGNOSIS — Z89612 Acquired absence of left leg above knee: Secondary | ICD-10-CM | POA: Insufficient documentation

## 2018-01-02 DIAGNOSIS — D500 Iron deficiency anemia secondary to blood loss (chronic): Secondary | ICD-10-CM | POA: Diagnosis not present

## 2018-01-02 DIAGNOSIS — R5383 Other fatigue: Secondary | ICD-10-CM | POA: Insufficient documentation

## 2018-01-02 DIAGNOSIS — E119 Type 2 diabetes mellitus without complications: Secondary | ICD-10-CM | POA: Diagnosis not present

## 2018-01-02 DIAGNOSIS — Z8639 Personal history of other endocrine, nutritional and metabolic disease: Secondary | ICD-10-CM

## 2018-01-02 LAB — CBC WITH DIFFERENTIAL/PLATELET
BASOS ABS: 0 10*3/uL (ref 0.0–0.1)
Basophils Relative: 0 %
EOS PCT: 2 %
Eosinophils Absolute: 0.2 10*3/uL (ref 0.0–0.5)
HCT: 25.5 % — ABNORMAL LOW (ref 34.8–46.6)
Hemoglobin: 7.8 g/dL — ABNORMAL LOW (ref 11.6–15.9)
Lymphocytes Relative: 20 %
Lymphs Abs: 1.4 10*3/uL (ref 0.9–3.3)
MCH: 28.8 pg (ref 25.1–34.0)
MCHC: 30.6 g/dL — ABNORMAL LOW (ref 31.5–36.0)
MCV: 94.1 fL (ref 79.5–101.0)
Monocytes Absolute: 0.5 10*3/uL (ref 0.1–0.9)
Monocytes Relative: 7 %
Neutro Abs: 5.2 10*3/uL (ref 1.5–6.5)
Neutrophils Relative %: 71 %
PLATELETS: 314 10*3/uL (ref 145–400)
RBC: 2.71 MIL/uL — AB (ref 3.70–5.45)
RDW: 16.7 % — AB (ref 11.2–14.5)
WBC: 7.3 10*3/uL (ref 3.9–10.3)

## 2018-01-02 LAB — FERRITIN: Ferritin: 8 ng/mL — ABNORMAL LOW (ref 11–307)

## 2018-01-02 LAB — RETICULOCYTES
RBC.: 2.71 MIL/uL — AB (ref 3.70–5.45)
RETIC CT PCT: 4.6 % — AB (ref 0.7–2.1)
Retic Count, Absolute: 124.7 10*3/uL — ABNORMAL HIGH (ref 33.7–90.7)

## 2018-01-02 LAB — LACTATE DEHYDROGENASE: LDH: 149 U/L (ref 98–192)

## 2018-01-02 LAB — IRON AND TIBC
Iron: 21 ug/dL — ABNORMAL LOW (ref 41–142)
SATURATION RATIOS: 4 % — AB (ref 21–57)
TIBC: 485 ug/dL — AB (ref 236–444)
UIBC: 464 ug/dL

## 2018-01-03 ENCOUNTER — Telehealth: Payer: Self-pay | Admitting: Hematology

## 2018-01-03 LAB — HAPTOGLOBIN: Haptoglobin: 97 mg/dL (ref 34–200)

## 2018-01-03 NOTE — Telephone Encounter (Signed)
Called pt re appts that were added per 8/8 sch msg - spoke w/ pt re appts

## 2018-01-06 ENCOUNTER — Ambulatory Visit: Payer: Medicare Other | Admitting: Nurse Practitioner

## 2018-01-06 ENCOUNTER — Ambulatory Visit: Payer: Medicare Other

## 2018-01-07 ENCOUNTER — Encounter: Payer: Self-pay | Admitting: Sports Medicine

## 2018-01-07 ENCOUNTER — Other Ambulatory Visit: Payer: Self-pay

## 2018-01-07 ENCOUNTER — Ambulatory Visit (INDEPENDENT_AMBULATORY_CARE_PROVIDER_SITE_OTHER): Payer: Medicare Other | Admitting: Sports Medicine

## 2018-01-07 DIAGNOSIS — B359 Dermatophytosis, unspecified: Secondary | ICD-10-CM | POA: Diagnosis not present

## 2018-01-07 DIAGNOSIS — E11610 Type 2 diabetes mellitus with diabetic neuropathic arthropathy: Secondary | ICD-10-CM | POA: Diagnosis not present

## 2018-01-07 LAB — PORPHYRINS, FRACTIONATION-PLASMA
Hexacarboxyl Porphyrins: <1 ug/dL (ref 0.0–1.0)
Pentacarboxyl Porphyrins: <1 ug/dL (ref 0.0–1.0)
Uroporphyrin: <1 ug/dL (ref 0.0–1.0)

## 2018-01-07 MED ORDER — TOLNAFTATE 1 % EX CREA: 1.0000 "application " | TOPICAL_CREAM | Freq: Two times a day (BID) | CUTANEOUS | 0 refills | Status: DC

## 2018-01-07 NOTE — Progress Notes (Signed)
Subjective: [name] is a 67 y.o. female patient with history of diabetes who presents to office today for diabetic foot exam. Patient reports that she was placed in boot on right because of charcot. Patient states that's she's scared because she does not want to loose her leg like she did 9 years ago on the left. Admits burning to top and bottom on right. Admits some swelling and warmth and redness to right foot. No other issues noted.  FBS not recorded.   Review of Systems  Musculoskeletal: Positive for joint pain.  Neurological: Positive for sensory change.  All other systems reviewed and are negative.   Patient Active Problem List   Diagnosis Date Noted  . Diabetic Charcot foot ([place]) 12/30/2017  . Generalized anxiety disorder 07/24/2017  . Tobacco use disorder 07/24/2017  . Tachycardia 07/10/2017  . Atherosclerosis of abdominal aorta ([place]) 10/25/2016  . Gout involving toe of right foot 02/28/2016  . History of hepatitis C 03/15/2015  . Substance use disorder 03/05/2015  . Iron deficiency anemia due to chronic blood loss 02/23/2015  . Vitamin D deficiency 12/02/2014  . Preventative health care 11/30/2014  . Depression 07/10/2013  . Type 2 diabetes mellitus ([place]) 06/07/2013  . Above knee amputation of left lower extremity ([place]) 08/18/2012  . Angiodysplasia of duodenum 08/03/2011  . Hypercholesterolemia 08/03/2011  . Melena 08/03/2011  . Essential hypertension, benign 07/22/2008  . GERD 07/22/2008   Current Outpatient Medications on File Prior to Visit  Medication Sig Dispense Refill  . ACCU-CHEK FASTCLIX LANCETS MISC Use to test blood glucose 3 times daily. 100 each 11  . ACCU-CHEK GUIDE test strip CHECK BLOOD SUGAR 3 TIMES A DAY 100 each 11  . allopurinol (ZYLOPRIM) 300 MG tablet TAKE 1 TABLET BY MOUTH ONCE DAILY 30 tablet 5  . aspirin EC 81 MG tablet Take 1 tablet (81 mg total) by mouth daily. 30 tablet 1  . cetirizine (ZYRTEC) 10 MG tablet TAKE 1 TABLET BY MOUTH  DAILY 90 tablet 1  . cholecalciferol (VITAMIN D) 1000 units tablet Take 1 tablet (1,000 Units total) by mouth daily. 100 tablet 2  . colchicine 0.6 MG tablet Take 1 tablet (0.6 mg total) by mouth daily as needed. 30 tablet 0  . cromolyn (OPTICROM) 4 % ophthalmic solution INSTILL 1 DROP INTO BOTH EYES FOUR TIMES A DAY  99  . DILT-XR 180 MG 24 hr capsule TAKE 1 CAPSULE BY MOUTH ONCE DAILY 30 capsule 2  . feeding supplement, GLUCERNA SHAKE, (GLUCERNA SHAKE) LIQD Take 237 mLs by mouth 3 (three) times daily between meals. (Patient taking differently: Take 237 mLs by mouth 2 (two) times daily between meals. ) 90 Can 0  . ferrous sulfate 325 (65 FE) MG EC tablet Take 1 tablet (325 mg total) by mouth daily after breakfast. 30 tablet 1  . FLUoxetine (PROZAC) 20 MG capsule TAKE 3 CAPSULES BY MOUTH DAILY 90 capsule 2  . gabapentin (NEURONTIN) 400 MG capsule TAKE 2 CAPSULES BY MOUTH THREE TIMES A DAY 180 capsule 5  . Insulin Glargine (LANTUS) 100 UNIT/ML Solostar Pen Inject 10 Units into the skin daily at 10 pm. 15 mL 2  . Insulin Pen Needle (UNIFINE PENTIPS) 32G X 4 MM MISC Use to inject insulin once a day. The patient is insulin requiring, ICD 10 code E11.9. The patient injects 1 times per day. 100 each 2  . losartan (COZAAR) 100 MG tablet TAKE 1 TABLET BY MOUTH EVERY DAY 90 tablet 3  . metFORMIN (  GLUCOPHAGE) 1000 MG tablet Take 1 tablet (1,000 mg total) by mouth 2 (two) times daily with a meal. 180 tablet 3  . omeprazole (PRILOSEC) 20 MG capsule TAKE 1 CAPSULE BY MOUTH EVERY DAY **STOP NEXIUM** 30 capsule 5  . Potassium Chloride ER 20 MEQ TBCR Take 20 mEq by mouth daily. 90 tablet 1  . tobramycin-dexamethasone (TOBRADEX) ophthalmic solution Place 1 drop into the left eye 4 (four) times daily.  1  . traZODone (DESYREL) 50 MG tablet TAKE ONE TABLET BY MOUTH AT BEDTIME 30 tablet 1  . vitamin C (ASCORBIC ACID) 500 MG tablet Take 500 mg by mouth every morning.     No current facility-administered medications on  file prior to visit.    Allergies  Allergen Reactions  . Ciprofloxacin Hives and Swelling  . Morphine And Related Other (See Comments)    Makes pt sad and paranoid  . Penicillins Hives and Swelling    Has patient had a PCN reaction causing immediate rash, facial/tongue/throat swelling, SOB or lightheadedness with hypotension:YES Has patient had a PCN reaction causing severe rash involving mucus membranes or skin necrosis:UNSURE Has patient had a PCN reaction that required hospitalization:YES Has patient had a PCN reaction occurring within the last 10 years:No If all of the above answers are "NO", then may proceed with Cephalosporin use.   . Codeine Hives and Swelling  . Lisinopril Cough    New onset dry cough after starting lisinopril. Resolved upon switching to losartan.  Shirlean Kelly [Ferumoxytol] Itching    Tolerated Feraheme 6/26 & 7/21 pre-medications prior to medication    Recent Results (from the past 2160 hour(s))  Glucose, capillary     Status: Abnormal   Collection Time: 10/29/17 10:08 AM  Result Value Ref Range   Glucose-Capillary 125 (H) 65 - 99 mg/dL  CBC with Differential     Status: Abnormal   Collection Time: 11/04/17  2:27 PM  Result Value Ref Range   WBC 6.9 3.9 - 10.3 K/uL   RBC 3.47 (L) 3.70 - 5.45 MIL/uL   Hemoglobin 10.4 (L) 11.6 - 15.9 g/dL   HCT 33.6 (L) 34.8 - 46.6 %   MCV 96.8 79.5 - 101.0 fL   MCH 30.0 25.1 - 34.0 pg   MCHC 31.0 (L) 31.5 - 36.0 g/dL   RDW 16.7 (H) 11.2 - 14.5 %   Platelets 287 145 - 400 K/uL   Neutrophils Relative % 63 %   Neutro Abs 4.4 1.5 - 6.5 K/uL   Lymphocytes Relative 24 %   Lymphs Abs 1.6 0.9 - 3.3 K/uL   Monocytes Relative 9 %   Monocytes Absolute 0.6 0.1 - 0.9 K/uL   Eosinophils Relative 4 %   Eosinophils Absolute 0.2 0.0 - 0.5 K/uL   Basophils Relative 0 %   Basophils Absolute 0.0 0.0 - 0.1 K/uL    Comment: Performed at [facility], [address], [place], [place] [address]  Ferritin      Status: None   Collection Time: 11/04/17  2:27 PM  Result Value Ref Range   Ferritin 18 9 - 269 ng/mL    Comment: Performed at [facility], [place] [address], [place], [place] [address]  CBC with Differential     Status: Abnormal   Collection Time: 12/02/17  1:50 PM  Result Value Ref Range   WBC 5.0 3.9 - 10.3 K/uL   RBC 2.99 (L) 3.70 - 5.45 MIL/uL   Hemoglobin 9.3 (L) 11.6 -  15.9 g/dL   HCT 29.9 (L) 34.8 - 46.6 %   MCV 100.0 79.5 - 101.0 fL   MCH 31.1 25.1 - 34.0 pg   MCHC 31.1 (L) 31.5 - 36.0 g/dL   RDW 18.9 (H) 11.2 - 14.5 %   Platelets 293 145 - 400 K/uL   Neutrophils Relative % 63 %   Neutro Abs 3.1 1.5 - 6.5 K/uL   Lymphocytes Relative 19 %   Lymphs Abs 1.0 0.9 - 3.3 K/uL   Monocytes Relative 16 %   Monocytes Absolute 0.8 0.1 - 0.9 K/uL   Eosinophils Relative 2 %   Eosinophils Absolute 0.1 0.0 - 0.5 K/uL   Basophils Relative 0 %   Basophils Absolute 0.0 0.0 - 0.1 K/uL    Comment: Performed at [facility], [place] [address], [place], [place] [address]  Ferritin     Status: None   Collection Time: 12/02/17  1:51 PM  Result Value Ref Range   Ferritin 89 11 - 307 ng/mL    Comment: Performed at [facility], [place] [address] [place], [place] [address]  Iron and TIBC     Status: None   Collection Time: 12/02/17  4:33 PM  Result Value Ref Range   Iron 102 41 - 142 ug/dL   TIBC 411 236 - 444 ug/dL   Saturation Ratios 25 21 - 57 %   UIBC 309 ug/dL    Comment: Performed at [facility], [place] [address], [place], [place] [address]  CBC with Differential     Status: Abnormal   Collection Time: 01/02/18  2:12 PM  Result Value Ref Range   WBC 7.3 3.9 - 10.3 K/uL   RBC 2.71 (L) 3.70 - 5.45 MIL/uL   Hemoglobin 7.8 (L) 11.6 - 15.9 g/dL   HCT 25.5 (L) 34.8 - 46.6 %   MCV 94.1 79.5 - 101.0 fL   MCH 28.8 25.1 - 34.0 pg   MCHC 30.6 (L) 31.5 - 36.0 g/dL   RDW 16.7 (H) 11.2 - 14.5 %    Platelets 314 145 - 400 K/uL   Neutrophils Relative % 71 %   Neutro Abs 5.2 1.5 - 6.5 K/uL   Lymphocytes Relative 20 %   Lymphs Abs 1.4 0.9 - 3.3 K/uL   Monocytes Relative 7 %   Monocytes Absolute 0.5 0.1 - 0.9 K/uL   Eosinophils Relative 2 %   Eosinophils Absolute 0.2 0.0 - 0.5 K/uL   Basophils Relative 0 %   Basophils Absolute 0.0 0.0 - 0.1 K/uL    Comment: Performed at [facility], [place] [address], [place], [place] [address]  Porphyrins, fractionation-plasma     Status: None   Collection Time: 01/02/18  2:12 PM  Result Value Ref Range   Uroporphyrin <1.0 0.0 - 1.0 ug/dL    Comment: (NOTE) Reference Range < or = 1.0 ug/dL     Normal >1.0 ug/dL          Elevated    Heptacarboxyl Porphyrins <1.0 0.0 - 1.0 ug/dL    Comment: (NOTE) Reference Range < or = 1.0 ug/dL     Normal >1.0 ug/dL          Elevated    Hexacarboxyl Porphyrins <1.0 0.0 - 1.0 ug/dL    Comment: (NOTE) Reference Range < or = 1.0 ug/dL     Normal >1.0 ug/dL          Elevated  Pentacarboxyl Porphyrins <1.0 0.0 - 1.0 ug/dL    Comment: (NOTE) Reference Range < or = 1.0 ug/dL     Normal >1.0 ug/dL          Elevated    Coproporphyrin. <1.0 0.0 - 1.0 ug/dL    Comment: (NOTE) Reference Range < or = 1.0 ug/dL     Normal >1.0 ug/dL          Elevated Performed At: [facility] [place], [place] [id number] Sheilah Pigeon MD [id number]    Protoporphyrin <1.0 0.0 - 1.0 ug/dL    Comment: (NOTE) Reference Range < or = 1.0 ug/dL     Normal >1.0 ug/dL          Elevated The performance characteristics of the listed assays were validated by [employer] The [place] FDA has not approved or cleared these tests. The results of these assays can be used for clinical diagnosis without FDA approval. [employer] is a [place] representative, CAP accredited laboratory for performing high complexity assays such as these. Testing Performed at:  [facility] [address] [place] [address]   Haptoglobin     Status: None   Collection Time: 01/02/18  2:12 PM  Result Value Ref Range   Haptoglobin 97 34 - 200 mg/dL    Comment: (NOTE) Performed At: [facility] [place], [place] [id number] Rush Farmer MD [id number]   Lactate dehydrogenase (LDH)     Status: None   Collection Time: 01/02/18  2:12 PM  Result Value Ref Range   LDH 149 98 - 192 U/L    Comment: Performed at [facility], [place] [address], [place], [place] [address]  Reticulocytes     Status: Abnormal   Collection Time: 01/02/18  2:12 PM  Result Value Ref Range   Retic Ct Pct 4.6 (H) 0.7 - 2.1 %   RBC. 2.71 (L) 3.70 - 5.45 MIL/uL   Retic Count, Absolute 124.7 (H) 33.7 - 90.7 K/uL    Comment: Performed at [facility], [place] [address] [place], [place] [address]  Ferritin     Status: Abnormal   Collection Time: 01/02/18  2:13 PM  Result Value Ref Range   Ferritin 8 (L) 11 - 307 ng/mL    Comment: Performed at [facility], [place] [address] [place], [place] [address]  Iron and TIBC     Status: Abnormal   Collection Time: 01/02/18  2:13 PM  Result Value Ref Range   Iron 21 (L) 41 - 142 ug/dL   TIBC 485 (H) 236 - 444 ug/dL   Saturation Ratios 4 (L) 21 - 57 %   UIBC 464 ug/dL    Comment: Performed at [facility], [place] [address], [place], [place] [address]    Objective: General: Patient is awake, alert, and oriented x 3 and in no acute distress.  Integument: Skin is warm, dry and supple on right. Nails are short and thick on right. No signs of infection. Scaly rash to plantar right foot consistent with tinea. No open lesions or preulcerative lesions present on right. Remaining integument unremarkable.  Vasculature:  Dorsalis Pedis pulse 1/4 right. Posterior Tibial pulse  1/4 right Capillary fill time <5 sec 1-5 on right,  scant hair growth to the level of the digits. Temperature gradient within normal limits. No varicosities present on right. No edema present bilateral.   Neurology: The patient  has absent sensation measured with a 5.07/10g Semmes Weinstein Monofilament at all pedal sites on right. Vibratory sensation absent on right with tuning fork.   Musculoskeletal:  Asymptomatic bunion and pes planus on right. S/p L BKA  Assessment and Plan: Problem List Items Addressed This Visit      Endocrine   Diabetic Charcot foot ([place]) - Primary    Other Visit Diagnoses    Tinea       Relevant Medications   tolnaftate (TINACTIN) 1 % cream      -Examined patient. -Discussed and educated patient on diabetic foot care, especially with  regards to the vascular, neurological and musculoskeletal systems.  -Stressed the importance of good glycemic control and the detriment of not  controlling glucose levels in relation to the foot. -Rx Tinactin for tinea and miconazole powder -Continue with CAM boot for ? Charcot  -Answered all patient questions -Patient to return  in 2 months for at risk foot care and xrays right foot -Patient advised to call the office if any problems or questions arise in the meantime.  Landis Martins, DPM

## 2018-01-08 ENCOUNTER — Telehealth: Payer: Self-pay | Admitting: Nurse Practitioner

## 2018-01-08 ENCOUNTER — Other Ambulatory Visit: Payer: Self-pay | Admitting: Internal Medicine

## 2018-01-08 ENCOUNTER — Encounter: Payer: Self-pay | Admitting: Nurse Practitioner

## 2018-01-08 ENCOUNTER — Inpatient Hospital Stay: Payer: Medicare Other

## 2018-01-08 ENCOUNTER — Inpatient Hospital Stay (HOSPITAL_BASED_OUTPATIENT_CLINIC_OR_DEPARTMENT_OTHER): Payer: Medicare Other | Admitting: Nurse Practitioner

## 2018-01-08 VITALS — BP 117/71 | HR 104 | Temp 98.7°F | Resp 18 | Ht 60.0 in | Wt 207.7 lb

## 2018-01-08 DIAGNOSIS — D500 Iron deficiency anemia secondary to blood loss (chronic): Secondary | ICD-10-CM

## 2018-01-08 DIAGNOSIS — B182 Chronic viral hepatitis C: Secondary | ICD-10-CM | POA: Diagnosis not present

## 2018-01-08 DIAGNOSIS — Z79899 Other long term (current) drug therapy: Secondary | ICD-10-CM

## 2018-01-08 DIAGNOSIS — R5383 Other fatigue: Secondary | ICD-10-CM

## 2018-01-08 DIAGNOSIS — K922 Gastrointestinal hemorrhage, unspecified: Secondary | ICD-10-CM

## 2018-01-08 DIAGNOSIS — E119 Type 2 diabetes mellitus without complications: Secondary | ICD-10-CM

## 2018-01-08 DIAGNOSIS — Z89612 Acquired absence of left leg above knee: Secondary | ICD-10-CM | POA: Diagnosis not present

## 2018-01-08 DIAGNOSIS — Z794 Long term (current) use of insulin: Secondary | ICD-10-CM

## 2018-01-08 DIAGNOSIS — E118 Type 2 diabetes mellitus with unspecified complications: Secondary | ICD-10-CM

## 2018-01-08 MED ORDER — FAMOTIDINE 20 MG PO TABS
20.0000 mg | ORAL_TABLET | Freq: Once | ORAL | Status: AC
  Administered 2018-01-08: 20 mg via ORAL

## 2018-01-08 MED ORDER — DIPHENHYDRAMINE HCL 50 MG/ML IJ SOLN
50.0000 mg | Freq: Once | INTRAMUSCULAR | Status: AC
  Administered 2018-01-08: 50 mg via INTRAVENOUS

## 2018-01-08 MED ORDER — FAMOTIDINE 20 MG PO TABS
ORAL_TABLET | ORAL | Status: AC
  Filled 2018-01-08: qty 1

## 2018-01-08 MED ORDER — SODIUM CHLORIDE 0.9 % IV SOLN
INTRAVENOUS | Status: DC
  Administered 2018-01-08: 15:00:00 via INTRAVENOUS
  Filled 2018-01-08: qty 250

## 2018-01-08 MED ORDER — DIPHENHYDRAMINE HCL 50 MG/ML IJ SOLN
INTRAMUSCULAR | Status: AC
  Filled 2018-01-08: qty 1

## 2018-01-08 MED ORDER — SODIUM CHLORIDE 0.9 % IV SOLN
510.0000 mg | Freq: Once | INTRAVENOUS | Status: AC
  Administered 2018-01-08: 510 mg via INTRAVENOUS
  Filled 2018-01-08: qty 17

## 2018-01-08 MED ORDER — FAMOTIDINE IN NACL 20-0.9 MG/50ML-% IV SOLN: 20.0000 mg | Freq: Once | INTRAVENOUS | Status: DC

## 2018-01-08 MED FILL — metFORMIN HCL 1000 MG TABS: 1000 | 90 days supply | Qty: 180 | Fill #0

## 2018-01-08 NOTE — Patient Instructions (Signed)

## 2018-01-08 NOTE — Telephone Encounter (Signed)
Scheduled appt per 8/14 los - gave patient AVS and calender per los

## 2018-01-08 NOTE — Progress Notes (Signed)
  [name] OFFICE PROGRESS NOTE   Diagnosis: Iron deficiency anemia  INTERVAL HISTORY:   [name] returns prior to scheduled follow-up due to labs from 01/02/2018 showing progressive anemia, iron deficiency.  She last received Feraheme on 11/15/2017.  At the time of her last visit on 12/02/2017 it was recommended she undergo a colonoscopy to rule out another source of bleeding versus malignancy.  She reports she has not undergone a colonoscopy.  She is not aware of any bleeding.  She continues one iron tablet daily.  Stools are chronically dark.  She felt her blood was probably low due to her hands becoming "tight".  She has also noted fatigue with activity.  She denies shortness of breath.  She continues to have a good appetite.  Objective:  Vital signs in last 24 hours:  Blood pressure 117/71, pulse (!) 104, temperature 98.7 F (37.1 C), temperature source Oral, resp. rate 18, height 5' (1.524 m), weight 207 lb 11.2 oz (94.2 kg), SpO2 100 %.    Resp: Lungs clear bilaterally. Cardio: Regular rate and rhythm. GI: Abdomen soft and nontender.  No hepatomegaly. Vascular: Right leg/foot in a boot/splint.  Left AKA with prosthesis. Neuro: Alert and oriented.    Lab Results:  Lab Results  Component Value Date   WBC 7.3 01/02/2018   HGB 7.8 (L) 01/02/2018   HCT 25.5 (L) 01/02/2018   MCV 94.1 01/02/2018   PLT 314 01/02/2018   NEUTROABS 5.2 01/02/2018    Imaging:  No results found.  Medications: I have reviewed the patient's current medications.  Assessment/Plan: 1. Iron deficiency anemia secondary to GI blood loss 2. History of small bowel AVM 3. Porphyria cutaneous tarda 4. Chronic hepatitis C infection status post treatment 2016 5. Diabetes mellitus type 2 6. History of left AKA  Disposition: [name] appears stable.  She last received IV Feraheme on 11/15/2017.  Most recent labs from 01/02/2018 show progressive anemia with ferritin of 8.  She will try  to increase oral iron from once daily to twice daily.  She will receive IV Feraheme today.  A referral was made to Dr. Loletha Carrow, gastroenterology, to consider a colonoscopy to evaluate for a source of blood loss.  She will return for labs every 2 weeks, follow-up visit in 8 weeks.  She will contact the office in the interim with any problems.  Plan reviewed with Dr. Burr Medico.    Ned Card ANP/GNP-BC   01/08/2018  2:38 PM

## 2018-01-10 ENCOUNTER — Ambulatory Visit: Payer: Medicare Other | Admitting: Hematology

## 2018-01-14 DIAGNOSIS — G933 Postviral fatigue syndrome: Secondary | ICD-10-CM | POA: Diagnosis not present

## 2018-01-15 DIAGNOSIS — E119 Type 2 diabetes mellitus without complications: Secondary | ICD-10-CM | POA: Diagnosis not present

## 2018-01-22 ENCOUNTER — Telehealth: Payer: Self-pay

## 2018-01-22 ENCOUNTER — Inpatient Hospital Stay: Payer: Medicare Other

## 2018-01-22 DIAGNOSIS — H10413 Chronic giant papillary conjunctivitis, bilateral: Secondary | ICD-10-CM | POA: Diagnosis not present

## 2018-01-22 DIAGNOSIS — D500 Iron deficiency anemia secondary to blood loss (chronic): Secondary | ICD-10-CM

## 2018-01-22 DIAGNOSIS — E119 Type 2 diabetes mellitus without complications: Secondary | ICD-10-CM | POA: Diagnosis not present

## 2018-01-22 DIAGNOSIS — Z961 Presence of intraocular lens: Secondary | ICD-10-CM | POA: Diagnosis not present

## 2018-01-22 DIAGNOSIS — Z89612 Acquired absence of left leg above knee: Secondary | ICD-10-CM | POA: Diagnosis not present

## 2018-01-22 DIAGNOSIS — Z79899 Other long term (current) drug therapy: Secondary | ICD-10-CM | POA: Diagnosis not present

## 2018-01-22 DIAGNOSIS — R5383 Other fatigue: Secondary | ICD-10-CM | POA: Diagnosis not present

## 2018-01-22 LAB — COMPREHENSIVE METABOLIC PANEL
ALK PHOS: 84 U/L (ref 38–126)
ALT: 43 U/L (ref 0–44)
ANION GAP: 9 (ref 5–15)
AST: 45 U/L — ABNORMAL HIGH (ref 15–41)
Albumin: 4.1 g/dL (ref 3.5–5.0)
BUN: 21 mg/dL (ref 8–23)
CALCIUM: 9.4 mg/dL (ref 8.9–10.3)
CO2: 22 mmol/L (ref 22–32)
Chloride: 110 mmol/L (ref 98–111)
Creatinine, Ser: 1.15 mg/dL — ABNORMAL HIGH (ref 0.44–1.00)
GFR, EST AFRICAN AMERICAN: 57 mL/min — AB (ref >60)
GFR, EST NON AFRICAN AMERICAN: 49 mL/min — AB (ref >60)
Glucose, Bld: 109 mg/dL — ABNORMAL HIGH (ref 70–99)
Potassium: 4.8 mmol/L (ref 3.5–5.1)
SODIUM: 141 mmol/L (ref 135–145)
TOTAL PROTEIN: 7.2 g/dL (ref 6.5–8.1)

## 2018-01-22 LAB — CBC WITH DIFFERENTIAL/PLATELET
BASOS ABS: 0 10*3/uL (ref 0.0–0.1)
BASOS PCT: 1 %
EOS ABS: 0.2 10*3/uL (ref 0.0–0.5)
Eosinophils Relative: 3 %
HEMATOCRIT: 27.8 % — AB (ref 34.8–46.6)
HEMOGLOBIN: 8.3 g/dL — AB (ref 11.6–15.9)
Lymphocytes Relative: 20 %
Lymphs Abs: 1.2 10*3/uL (ref 0.9–3.3)
MCH: 29.3 pg (ref 25.1–34.0)
MCHC: 29.9 g/dL — AB (ref 31.5–36.0)
MCV: 98.2 fL (ref 79.5–101.0)
Monocytes Absolute: 0.8 10*3/uL (ref 0.1–0.9)
Monocytes Relative: 14 %
NEUTROS PCT: 62 %
Neutro Abs: 3.8 10*3/uL (ref 1.5–6.5)
Platelets: 356 10*3/uL (ref 145–400)
RBC: 2.83 MIL/uL — AB (ref 3.70–5.45)
RDW: 22.3 % — ABNORMAL HIGH (ref 11.2–14.5)
WBC: 6.1 10*3/uL (ref 3.9–10.3)

## 2018-01-22 LAB — HM DIABETES EYE EXAM

## 2018-01-22 NOTE — Telephone Encounter (Signed)
Patient came in to get a copy of updated schedule. Per 8/28 walk in

## 2018-01-23 ENCOUNTER — Other Ambulatory Visit: Payer: Self-pay | Admitting: Internal Medicine

## 2018-01-23 DIAGNOSIS — M109 Gout, unspecified: Secondary | ICD-10-CM

## 2018-01-24 NOTE — Telephone Encounter (Signed)
Next appt scheduled  9/4 with PCP.

## 2018-01-28 MED FILL — UNIFINE PENTIPS 32GX5/32": 32G X 4 MM | 90 days supply | Qty: 100 | Fill #1

## 2018-01-28 MED FILL — UNIFINE PENTIPS 32GX5/32: 32G X 4 MM | 90 days supply | Qty: 100 | Fill #1

## 2018-01-29 ENCOUNTER — Encounter: Payer: Medicare Other | Admitting: Internal Medicine

## 2018-01-29 ENCOUNTER — Encounter: Payer: Self-pay | Admitting: Internal Medicine

## 2018-01-29 ENCOUNTER — Other Ambulatory Visit: Payer: Self-pay

## 2018-01-29 ENCOUNTER — Ambulatory Visit (HOSPITAL_COMMUNITY)
Admission: RE | Admit: 2018-01-29 | Discharge: 2018-01-29 | Disposition: A | Discharge status: Home / Self Care | Payer: Medicare Other | Source: Ambulatory Visit | Attending: Family Medicine | Admitting: Family Medicine

## 2018-01-29 ENCOUNTER — Ambulatory Visit (INDEPENDENT_AMBULATORY_CARE_PROVIDER_SITE_OTHER): Payer: Medicare Other | Admitting: Internal Medicine

## 2018-01-29 VITALS — BP 120/69 | HR 97 | Temp 99.0°F

## 2018-01-29 DIAGNOSIS — E785 Hyperlipidemia, unspecified: Secondary | ICD-10-CM

## 2018-01-29 DIAGNOSIS — Z794 Long term (current) use of insulin: Secondary | ICD-10-CM

## 2018-01-29 DIAGNOSIS — Z8619 Personal history of other infectious and parasitic diseases: Secondary | ICD-10-CM

## 2018-01-29 DIAGNOSIS — F1490 Cocaine use, unspecified, uncomplicated: Secondary | ICD-10-CM

## 2018-01-29 DIAGNOSIS — E875 Hyperkalemia: Secondary | ICD-10-CM

## 2018-01-29 DIAGNOSIS — I440 Atrioventricular block, first degree: Secondary | ICD-10-CM

## 2018-01-29 DIAGNOSIS — F329 Major depressive disorder, single episode, unspecified: Secondary | ICD-10-CM

## 2018-01-29 DIAGNOSIS — R112 Nausea with vomiting, unspecified: Secondary | ICD-10-CM

## 2018-01-29 DIAGNOSIS — Z89612 Acquired absence of left leg above knee: Secondary | ICD-10-CM

## 2018-01-29 DIAGNOSIS — I10 Essential (primary) hypertension: Secondary | ICD-10-CM

## 2018-01-29 DIAGNOSIS — M109 Gout, unspecified: Secondary | ICD-10-CM

## 2018-01-29 DIAGNOSIS — Z79899 Other long term (current) drug therapy: Secondary | ICD-10-CM

## 2018-01-29 DIAGNOSIS — E11610 Type 2 diabetes mellitus with diabetic neuropathic arthropathy: Secondary | ICD-10-CM

## 2018-01-29 DIAGNOSIS — K5520 Angiodysplasia of colon without hemorrhage: Secondary | ICD-10-CM

## 2018-01-29 DIAGNOSIS — Z8719 Personal history of other diseases of the digestive system: Secondary | ICD-10-CM

## 2018-01-29 LAB — BASIC METABOLIC PANEL
Anion gap: 9 (ref 5–15)
BUN: 30 mg/dL — ABNORMAL HIGH (ref 8–23)
CALCIUM: 9.5 mg/dL (ref 8.9–10.3)
CO2: 22 mmol/L (ref 22–32)
Chloride: 107 mmol/L (ref 98–111)
Creatinine, Ser: 1.33 mg/dL — ABNORMAL HIGH (ref 0.44–1.00)
GFR calc non Af Amer: 41 mL/min — ABNORMAL LOW (ref >60)
GFR, EST AFRICAN AMERICAN: 47 mL/min — AB (ref >60)
Glucose, Bld: 95 mg/dL (ref 70–99)
Potassium: 6.6 mmol/L (ref 3.5–5.1)
Sodium: 138 mmol/L (ref 135–145)

## 2018-01-29 LAB — GLUCOSE, CAPILLARY
GLUCOSE-CAPILLARY: 85 mg/dL (ref 70–99)
Glucose-Capillary: 86 mg/dL (ref 70–99)

## 2018-01-29 MED ORDER — SODIUM CHLORIDE 0.9 % IV BOLUS
500.0000 mL | Freq: Once | INTRAVENOUS | Status: AC
  Administered 2018-01-29: 500 mL via INTRAVENOUS

## 2018-01-29 MED ORDER — ONDANSETRON HCL 4 MG PO TABS: 4.0000 mg | ORAL_TABLET | Freq: Three times a day (TID) | ORAL | 0 refills | Status: DC | PRN

## 2018-01-29 MED ORDER — PROMETHAZINE HCL 25 MG/ML IJ SOLN: 12.5000 mg | Freq: Once | INTRAMUSCULAR | Status: DC

## 2018-01-29 MED ORDER — PROMETHAZINE HCL 25 MG/ML IJ SOLN
25.0000 mg | Freq: Once | INTRAMUSCULAR | Status: AC
  Administered 2018-01-29: 25 mg via INTRAMUSCULAR

## 2018-01-29 MED ORDER — SODIUM POLYSTYRENE SULFONATE PO POWD: ORAL | 0 refills | Status: DC

## 2018-01-29 MED FILL — SODIUM POLYSTYRENE SULF PWD: 1 days supply | Qty: 30 | Fill #0

## 2018-01-29 NOTE — Progress Notes (Signed)
   CC: vomiting  HPI:  [name] is a 67 y.o. with a PMH of HCV in SVR, T2DM, HTN, HLD, gout, MDD, chronic GI bleeding from AVMs, and cocaine use disorderpresenting to clinic for vomiting.  Patient endorses NBNB vomiting for 3 days however had mild red streaks initially when her symptoms began; she has only been able to keep down any food or water, but over the last 2 days has kept down 2 Glucerna shakes. She also endorses loose stools w/o blood; stools are chronically dark. She overall feels a little weak and felt she might have had a fever last night. She denies abd pain, chest pain, shortness of breath. Denies sick contacts. She has continued to take her lantus 10 units at night, only been able to take her metformin once in the last 3 days and not been taking her potassium.   Please see problem based Assessment and Plan for status of patients chronic conditions.  Past Medical History:  Diagnosis Date  . Allergic rhinitis   . Anemia, iron deficiency 05/03/2011   2/2 GI AVMs - recieves iron infusions as well as periodic red blood cell transfusions  . Anxiety   . Arteriovenous malformation of duodenum 02/25/2015  . CARPAL TUNNEL RELEASE, HX OF 07/22/2008   Qualifier: Diagnosis of  By: Tomma Lightning MD, Claiborne Billings    . Colon polyps   . Diabetes mellitus without complication ([place])    type 2  . Difficulty sleeping    takes trazadone for sleep  . Diverticulosis   . GERD (gastroesophageal reflux disease)   . Hepatitis C    in SVR with Harvoni 12/2014-02/2015.   Marland Kitchen History of hernia repair 08/18/2012  . History of porphyria 11/30/2014   Porphyria cutanea tarda in setting of chronic Hepatitis C, frequent  IV iron infusions, and alcohol abuse   . Hypertension   . Peripheral vascular disease ([place])   . Stroke [facility]) 2010   no residual problems  . Substance use disorder    cocaine    Review of Systems:   Per HPI  Physical Exam:  Vitals:   01/29/18 1446  BP: 120/69  Pulse: 97  Temp: 99 F  (37.2 C)  TempSrc: Oral  SpO2: 96%   GENERAL- alert, intermittently joking  HEENT- oral mucosa appears dry. CARDIAC- RRR, no murmurs, rubs or gallops. RESP- Moving equal volumes of air, and clear to auscultation bilaterally, no wheezes or crackles. ABDOMEN- Soft, nontender, bowel sounds present. EXTREMITIES- s/p L AKA, R LE in boot for Charcot foot.  Assessment & Plan:   See Encounters Tab for problem based charting.   Patient discussed with Dr. Nilsa Nutting, MD Internal Medicine PGY-3

## 2018-01-29 NOTE — Assessment & Plan Note (Addendum)
Patient found to have hyperkalemia to 6.6 on stat labs today; mild worsening of renal function <0.3 unit increase from previous values in recent months. She is on chronic potassium supplementation, however states she has not taken these since she's been sick.   EKG obtained in clinic and personally reviewed - SR with 1st degree AV block that is chronic; no peaked TW or QRS widening; no acute ischemic changes.  Advised direct admission or at the very least transfer to ED for repeat labs, management and monitoring of hyperkalemia however patient declines and states understanding of risk of arrhythmias and cardiac arrest. She states she will try to get transportation to be seen in the clinic in the morning.  Plan: --given 500cc NS bolus in clinic --kayexelate 30mg  once to take at home --advised to continue holding potassium --f/u in AM; advised to go to ED if worsening symptoms or new symptoms of CP/palpitations

## 2018-01-29 NOTE — Patient Instructions (Addendum)
Hold your metformin, potassium and colchicine while you are sick.  I sent in kayexalate to your pharmacy; take one dose today; please come in the morning to be seen and to recheck your labs.   Please don't hesitate to call EMS if you start feeling worse, don't feel better or start having chest pain, or palpitations  Decrease you lantus to 5 units at night; continue checking your sugars regularly and call if they are consistently low.  I sent in a nausea medicine to help; you can take it every 8 hours if needed for nausea. If you don't feel better in the next couple of days please come in or go to the ED.

## 2018-01-29 NOTE — Assessment & Plan Note (Addendum)
Patient with 3d h/o NBNB vomiting, subjective fever, and inability to tolerate PO intake w/o symptoms of CP or shortness of breath. This likely presents a viral gastroenteritis, however she does have RF for CAD. EKG was not ischemic today.   She had dry mucous membranes on exam, nontender abdomen, with decreased but present bowel sounds.  Stat bmet obtained in clinic revealed slight worsening of Cr to 1.3 (from 1.1), and hyperkalemia to 6.6 w/o visible hemolysis. She was advised to get admitted for further monitoring and management, however she declined.   Plan: --s/p 500cc NS bolus --s/p 12.5mg  IV phenergan --zofran 4mg  q8hr PRN --asked patient to hold metformin, colchicine (due to potential for worsening her symptoms) and potassium (in setting of hyperkalemia).  --asked her to decrease her lantus to 5 units while she is not tolerating food to avoid hypoglycemia --she will f/u in clinic in the morning; asked her to go to the ED if symptoms worsen or new symptoms arise with which she was in agreement

## 2018-01-30 NOTE — Progress Notes (Signed)
Internal Medicine Clinic Attending  Case discussed with Dr. Svalina  at the time of the visit.  We reviewed the resident's history and exam and pertinent patient test results.  I agree with the assessment, diagnosis, and plan of care documented in the resident's note.  

## 2018-02-03 ENCOUNTER — Other Ambulatory Visit: Payer: Medicare Other

## 2018-02-04 ENCOUNTER — Ambulatory Visit (INDEPENDENT_AMBULATORY_CARE_PROVIDER_SITE_OTHER): Payer: Medicare Other | Admitting: Sports Medicine

## 2018-02-04 ENCOUNTER — Telehealth: Payer: Self-pay | Admitting: *Deleted

## 2018-02-04 ENCOUNTER — Encounter: Payer: Self-pay | Admitting: *Deleted

## 2018-02-04 ENCOUNTER — Encounter: Payer: Self-pay | Admitting: Sports Medicine

## 2018-02-04 DIAGNOSIS — M792 Neuralgia and neuritis, unspecified: Secondary | ICD-10-CM | POA: Diagnosis not present

## 2018-02-04 DIAGNOSIS — E11610 Type 2 diabetes mellitus with diabetic neuropathic arthropathy: Secondary | ICD-10-CM

## 2018-02-04 DIAGNOSIS — I739 Peripheral vascular disease, unspecified: Secondary | ICD-10-CM

## 2018-02-04 NOTE — Telephone Encounter (Signed)
-----   Message from Landis Martins, [place] sent at 02/04/2018  4:18 PM EDT ----- Regarding: NCV and ABIs Amputee with right foot burning pain eval for neurpathy on right  Eval for PAD on right

## 2018-02-04 NOTE — Progress Notes (Signed)
Subjective: [name] is a 67 y.o. female patient with history of diabetes who returns office for follow-up evaluation of right foot pain states that her right foot has a burning sensation across the top and states that she has been wearing her cam boot which seems to help her pain and discomfort states that she is still struggling with keeping her blood sugars under control on average ranges anywhere from 150 the highest 80 at the lowest with her blood sugar today recorded at 123 states that she is concerned about possibly losing her right leg like she has on her left and still is struggling with smoking.  Patient Active Problem List   Diagnosis Date Noted  . Nausea and vomiting 01/29/2018  . Diabetic Charcot foot ([place]) 12/30/2017  . Generalized anxiety disorder 07/24/2017  . Tobacco use disorder 07/24/2017  . Tachycardia 07/10/2017  . Atherosclerosis of abdominal aorta ([place]) 10/25/2016  . Gout involving toe of right foot 02/28/2016  . History of hepatitis C 03/15/2015  . Substance use disorder 03/05/2015  . Iron deficiency anemia due to chronic blood loss 02/23/2015  . Vitamin D deficiency 12/02/2014  . Preventative health care 11/30/2014  . Depression 07/10/2013  . Type 2 diabetes mellitus ([place]) 06/07/2013  . Above knee amputation of left lower extremity ([employer]) 08/18/2012  . Hyperkalemia 03/27/2012  . Angiodysplasia of duodenum 08/03/2011  . Melena 08/03/2011  . Essential hypertension, benign 07/22/2008  . GERD 07/22/2008   Current Outpatient Medications on File Prior to Visit  Medication Sig Dispense Refill  . ACCU-CHEK FASTCLIX LANCETS MISC Use to test blood glucose 3 times daily. 100 each 11  . ACCU-CHEK GUIDE test strip CHECK BLOOD SUGAR 3 TIMES A DAY 100 each 11  . allopurinol (ZYLOPRIM) 300 MG tablet TAKE 1 TABLET BY MOUTH ONCE DAILY 30 tablet 5  . aspirin EC 81 MG tablet Take 1 tablet (81 mg total) by mouth daily. 30 tablet 1  . cetirizine (ZYRTEC) 10 MG tablet TAKE 1  TABLET BY MOUTH DAILY 90 tablet 1  . cholecalciferol (VITAMIN D) 1000 units tablet Take 1 tablet (1,000 Units total) by mouth daily. 100 tablet 2  . colchicine 0.6 MG tablet TAKE ONE TABLET BY MOUTH DAILY 30 tablet 0  . cromolyn (OPTICROM) 4 % ophthalmic solution INSTILL 1 DROP INTO BOTH EYES FOUR TIMES A DAY  99  . DILT-XR 180 MG 24 hr capsule TAKE 1 CAPSULE BY MOUTH ONCE DAILY 30 capsule 2  . feeding supplement, GLUCERNA SHAKE, (GLUCERNA SHAKE) LIQD Take 237 mLs by mouth 3 (three) times daily between meals. (Patient taking differently: Take 237 mLs by mouth 2 (two) times daily between meals. ) 90 Can 0  . ferrous sulfate 325 (65 FE) MG EC tablet Take 1 tablet (325 mg total) by mouth daily after breakfast. 30 tablet 1  . FLUoxetine (PROZAC) 20 MG capsule TAKE 3 CAPSULES BY MOUTH DAILY 90 capsule 5  . gabapentin (NEURONTIN) 400 MG capsule TAKE 2 CAPSULES BY MOUTH THREE TIMES A DAY 180 capsule 5  . Insulin Glargine (LANTUS) 100 UNIT/ML Solostar Pen Inject 10 Units into the skin daily at 10 pm. 15 mL 2  . Insulin Pen Needle (UNIFINE PENTIPS) 32G X 4 MM MISC Use to inject insulin once a day. The patient is insulin requiring, ICD 10 code E11.9. The patient injects 1 times per day. 100 each 2  . losartan (COZAAR) 100 MG tablet TAKE 1 TABLET BY MOUTH EVERY DAY 90 tablet 3  . metFORMIN (  GLUCOPHAGE) 1000 MG tablet TAKE 1 TABLET (1,000 MG TOTAL) BY MOUTH TWICE A DAY WITH A MEAL. 180 tablet 1  . omeprazole (PRILOSEC) 20 MG capsule TAKE 1 CAPSULE BY MOUTH EVERY DAY 30 capsule 5  . ondansetron (ZOFRAN) 4 MG tablet Take 1 tablet (4 mg total) by mouth every 8 (eight) hours as needed for nausea or vomiting. 20 tablet 0  . potassium chloride SA (K-DUR,KLOR-CON) 20 MEQ tablet Take by mouth.    . sodium polystyrene (KAYEXALATE) powder Take 30g once today. 30 g 0  . tobramycin-dexamethasone (TOBRADEX) ophthalmic solution Place 1 drop into the left eye 4 (four) times daily.  1  . tolnaftate (TINACTIN) 1 % cream Apply  1 application topically 2 (two) times daily. 30 g 0  . traZODone (DESYREL) 50 MG tablet TAKE ONE TABLET BY MOUTH AT BEDTIME 30 tablet 1  . triamcinolone cream (KENALOG) 0.1 % Apply topically.    . vitamin C (ASCORBIC ACID) 500 MG tablet Take 500 mg by mouth every morning.     No current facility-administered medications on file prior to visit.    Allergies  Allergen Reactions  . Ciprofloxacin Hives and Swelling  . Morphine And Related Other (See Comments)    Makes pt sad and paranoid  . Penicillins Hives and Swelling    Has patient had a PCN reaction causing immediate rash, facial/tongue/throat swelling, SOB or lightheadedness with hypotension:YES Has patient had a PCN reaction causing severe rash involving mucus membranes or skin necrosis:UNSURE Has patient had a PCN reaction that required hospitalization:YES Has patient had a PCN reaction occurring within the last 10 years:No If all of the above answers are "NO", then may proceed with Cephalosporin use.   . Codeine Hives and Swelling  . Lisinopril Cough    New onset dry cough after starting lisinopril. Resolved upon switching to losartan.  Shirlean Kelly [Ferumoxytol] Itching    Tolerated Feraheme 6/26 & 7/21 pre-medications prior to medication    Recent Results (from the past 2160 hour(s))  CBC with Differential     Status: Abnormal   Collection Time: 12/02/17  1:50 PM  Result Value Ref Range   WBC 5.0 3.9 - 10.3 K/uL   RBC 2.99 (L) 3.70 - 5.45 MIL/uL   Hemoglobin 9.3 (L) 11.6 - 15.9 g/dL   HCT 29.9 (L) 34.8 - 46.6 %   MCV 100.0 79.5 - 101.0 fL   MCH 31.1 25.1 - 34.0 pg   MCHC 31.1 (L) 31.5 - 36.0 g/dL   RDW 18.9 (H) 11.2 - 14.5 %   Platelets 293 145 - 400 K/uL   Neutrophils Relative % 63 %   Neutro Abs 3.1 1.5 - 6.5 K/uL   Lymphocytes Relative 19 %   Lymphs Abs 1.0 0.9 - 3.3 K/uL   Monocytes Relative 16 %   Monocytes Absolute 0.8 0.1 - 0.9 K/uL   Eosinophils Relative 2 %   Eosinophils Absolute 0.1 0.0 - 0.5 K/uL    Basophils Relative 0 %   Basophils Absolute 0.0 0.0 - 0.1 K/uL    Comment: Performed at [facility], [address], [place], [place] [address]  Ferritin     Status: None   Collection Time: 12/02/17  1:51 PM  Result Value Ref Range   Ferritin 89 11 - 307 ng/mL    Comment: Performed at [facility], [place] [address], [place], [place] [address]  Iron and TIBC     Status: None   Collection  Time: 12/02/17  4:33 PM  Result Value Ref Range   Iron 102 41 - 142 ug/dL   TIBC 411 236 - 444 ug/dL   Saturation Ratios 25 21 - 57 %   UIBC 309 ug/dL    Comment: Performed at [facility], [address] [place], [place] [address]  CBC with Differential     Status: Abnormal   Collection Time: 01/02/18  2:12 PM  Result Value Ref Range   WBC 7.3 3.9 - 10.3 K/uL   RBC 2.71 (L) 3.70 - 5.45 MIL/uL   Hemoglobin 7.8 (L) 11.6 - 15.9 g/dL   HCT 25.5 (L) 34.8 - 46.6 %   MCV 94.1 79.5 - 101.0 fL   MCH 28.8 25.1 - 34.0 pg   MCHC 30.6 (L) 31.5 - 36.0 g/dL   RDW 16.7 (H) 11.2 - 14.5 %   Platelets 314 145 - 400 K/uL   Neutrophils Relative % 71 %   Neutro Abs 5.2 1.5 - 6.5 K/uL   Lymphocytes Relative 20 %   Lymphs Abs 1.4 0.9 - 3.3 K/uL   Monocytes Relative 7 %   Monocytes Absolute 0.5 0.1 - 0.9 K/uL   Eosinophils Relative 2 %   Eosinophils Absolute 0.2 0.0 - 0.5 K/uL   Basophils Relative 0 %   Basophils Absolute 0.0 0.0 - 0.1 K/uL    Comment: Performed at [facility], [place] [address], [place], [place] [address]  Porphyrins, fractionation-plasma     Status: None   Collection Time: 01/02/18  2:12 PM  Result Value Ref Range   Uroporphyrin <1.0 0.0 - 1.0 ug/dL    Comment: (NOTE) Reference Range < or = 1.0 ug/dL     Normal >1.0 ug/dL          Elevated    Heptacarboxyl Porphyrins <1.0 0.0 - 1.0 ug/dL    Comment: (NOTE) Reference Range < or = 1.0 ug/dL     Normal >1.0 ug/dL          Elevated     Hexacarboxyl Porphyrins <1.0 0.0 - 1.0 ug/dL    Comment: (NOTE) Reference Range < or = 1.0 ug/dL     Normal >1.0 ug/dL          Elevated    Pentacarboxyl Porphyrins <1.0 0.0 - 1.0 ug/dL    Comment: (NOTE) Reference Range < or = 1.0 ug/dL     Normal >1.0 ug/dL          Elevated    Coproporphyrin. <1.0 0.0 - 1.0 ug/dL    Comment: (NOTE) Reference Range < or = 1.0 ug/dL     Normal >1.0 ug/dL          Elevated Performed At: [facility] [place], [place] [id number] Sheilah Pigeon MD [id number]    Protoporphyrin <1.0 0.0 - 1.0 ug/dL    Comment: (NOTE) Reference Range < or = 1.0 ug/dL     Normal >1.0 ug/dL          Elevated The performance characteristics of the listed assays were validated by [employer] The [place] FDA has not approved or cleared these tests. The results of these assays can be used for clinical diagnosis without FDA approval. [facility] is a [employer] representative, CAP accredited laboratory for performing high complexity assays such as these. Testing Performed at: [facility] [address] [place] [address]   Haptoglobin     Status: None  Collection Time: 01/02/18  2:12 PM  Result Value Ref Range   Haptoglobin 97 34 - 200 mg/dL    Comment: (NOTE) Performed At: [facility] [place], [place] [id number] Rush Farmer MD NF:[id number]   Lactate dehydrogenase (LDH)     Status: None   Collection Time: 01/02/18  2:12 PM  Result Value Ref Range   LDH 149 98 - 192 U/L    Comment: Performed at [facility], [place] [address] [place], [place] [address]  Reticulocytes     Status: Abnormal   Collection Time: 01/02/18  2:12 PM  Result Value Ref Range   Retic Ct Pct 4.6 (H) 0.7 - 2.1 %   RBC. 2.71 (L) 3.70 - 5.45 MIL/uL   Retic Count, Absolute 124.7 (H) 33.7 - 90.7 K/uL    Comment: Performed at [facility], [place]  [address], [place], [place] [address]  Ferritin     Status: Abnormal   Collection Time: 01/02/18  2:13 PM  Result Value Ref Range   Ferritin 8 (L) 11 - 307 ng/mL    Comment: Performed at [facility], [place] [address], [place], [place] [address]  Iron and TIBC     Status: Abnormal   Collection Time: 01/02/18  2:13 PM  Result Value Ref Range   Iron 21 (L) 41 - 142 ug/dL   TIBC 485 (H) 236 - 444 ug/dL   Saturation Ratios 4 (L) 21 - 57 %   UIBC 464 ug/dL    Comment: Performed at [facility], [place] [address] [place], [place] [address]  HM DIABETES EYE EXAM     Status: None   Collection Time: 01/22/18 12:00 AM  Result Value Ref Range   HM Diabetic Eye Exam No Retinopathy No Retinopathy    Comment: seen by Dr. Clent Jacks  Comprehensive metabolic panel     Status: Abnormal   Collection Time: 01/22/18  1:21 PM  Result Value Ref Range   Sodium 141 135 - 145 mmol/L   Potassium 4.8 3.5 - 5.1 mmol/L   Chloride 110 98 - 111 mmol/L   CO2 22 22 - 32 mmol/L   Glucose, Bld 109 (H) 70 - 99 mg/dL   BUN 21 8 - 23 mg/dL   Creatinine, Ser 1.15 (H) 0.44 - 1.00 mg/dL   Calcium 9.4 8.9 - 10.3 mg/dL   Total Protein 7.2 6.5 - 8.1 g/dL   Albumin 4.1 3.5 - 5.0 g/dL   AST 45 (H) 15 - 41 U/L   ALT 43 0 - 44 U/L   Alkaline Phosphatase 84 38 - 126 U/L   Total Bilirubin <0.2 (L) 0.3 - 1.2 mg/dL   GFR calc non Af Amer 49 (L) >60 mL/min   GFR calc Af Amer 57 (L) >60 mL/min    Comment: (NOTE) The eGFR has been calculated using the CKD EPI equation. This calculation has not been validated in all clinical situations. eGFR's persistently <60 mL/min signify possible Chronic Kidney Disease.    Anion gap 9 5 - 15    Comment: Performed at [facility], [address], [place], [place] [address]  CBC with Differential     Status: Abnormal   Collection Time: 01/22/18  1:21 PM  Result Value Ref Range   WBC 6.1 3.9 - 10.3 K/uL   RBC 2.83 (L)  3.70 - 5.45 MIL/uL   Hemoglobin 8.3 (L) 11.6 - 15.9 g/dL  HCT 27.8 (L) 34.8 - 46.6 %   MCV 98.2 79.5 - 101.0 fL   MCH 29.3 25.1 - 34.0 pg   MCHC 29.9 (L) 31.5 - 36.0 g/dL   RDW 22.3 (H) 11.2 - 14.5 %   Platelets 356 145 - 400 K/uL   Neutrophils Relative % 62 %   Neutro Abs 3.8 1.5 - 6.5 K/uL   Lymphocytes Relative 20 %   Lymphs Abs 1.2 0.9 - 3.3 K/uL   Monocytes Relative 14 %   Monocytes Absolute 0.8 0.1 - 0.9 K/uL   Eosinophils Relative 3 %   Eosinophils Absolute 0.2 0.0 - 0.5 K/uL   Basophils Relative 1 %   Basophils Absolute 0.0 0.0 - 0.1 K/uL    Comment: Performed at [facility], [place] [address] [place], [place] [address]  Glucose, capillary     Status: None   Collection Time: 01/29/18  2:57 PM  Result Value Ref Range   Glucose-Capillary 85 70 - 99 mg/dL  BMP w Anion Gap (STAT/Sunquest-performed on-site)     Status: Abnormal   Collection Time: 01/29/18  3:50 PM  Result Value Ref Range   Sodium 138 135 - 145 mmol/L   Potassium 6.6 (HH) 3.5 - 5.1 mmol/L    Comment: NO VISIBLE HEMOLYSIS CRITICAL RESULT CALLED TO, READ BACK BY AND VERIFIED WITH: DR Jari Favre AT 1639 01/29/18 BY L BENFIELD    Chloride 107 98 - 111 mmol/L   CO2 22 22 - 32 mmol/L   Glucose, Bld 95 70 - 99 mg/dL   BUN 30 (H) 8 - 23 mg/dL   Creatinine, Ser 1.33 (H) 0.44 - 1.00 mg/dL   Calcium 9.5 8.9 - 10.3 mg/dL   GFR calc non Af Amer 41 (L) >60 mL/min   GFR calc Af Amer 47 (L) >60 mL/min    Comment: (NOTE) The eGFR has been calculated using the CKD EPI equation. This calculation has not been validated in all clinical situations. eGFR's persistently <60 mL/min signify possible Chronic Kidney Disease.    Anion gap 9 5 - 15    Comment: Performed at [place] [address], [place], [place] [address]  Glucose, capillary     Status: None   Collection Time: 01/29/18  5:18 PM  Result Value Ref Range   Glucose-Capillary 86 70 - 99 mg/dL    Objective: General: Patient is  awake, alert, and oriented x 3 and in no acute distress.  Integument: Skin is warm, dry and supple on right. Nails are short and thick on right. No signs of infection. Scaly rash to plantar right foot consistent with tinea that is improving. No open lesions or preulcerative lesions present on right. Remaining integument unremarkable.  Vasculature:  Dorsalis Pedis pulse 1/4 right. Posterior Tibial pulse  1/4 right Capillary fill time <5 sec 1-5 on right, scant hair growth to the level of the digits however there is concerns due to patient risk factor of microvascular disease thus will benefit from ABI testing. Temperature gradient within normal limits. No varicosities present on right. No edema present bilateral.   Neurology: The patient has absent sensation measured with a 5.07/10g Semmes Weinstein Monofilament at all pedal sites on right. Vibratory sensation absent on right with tuning fork.   Musculoskeletal:  Asymptomatic bunion and pes planus on right. S/p L BKA  Assessment and Plan: Problem List Items Addressed This Visit      Endocrine   Diabetic Charcot foot ([place]) - Primary  Other Visit Diagnoses    Neuritis       PAD (peripheral artery disease) ([place])          -Examined patient. -Discussed and educated patient on diabetic foot care, especially with  regards to the vascular, neurological and musculoskeletal systems.  -Ordered ABI testing and nerve conduction testing to further evaluate burning pain to the top of the foot -Encourage smoking sensation -Continue with Tinactin for tinea and miconazole powder -Continue with CAM boot for ? Charcot on right -Answered all patient questions -Patient to return after nerve conduction and ABI testing -Patient advised to call the office if any problems or questions arise in the meantime.  Landis Martins, DPM

## 2018-02-05 ENCOUNTER — Encounter: Payer: Self-pay | Admitting: Neurology

## 2018-02-05 ENCOUNTER — Inpatient Hospital Stay: Payer: Medicare Other | Attending: Nurse Practitioner

## 2018-02-05 DIAGNOSIS — K219 Gastro-esophageal reflux disease without esophagitis: Secondary | ICD-10-CM | POA: Insufficient documentation

## 2018-02-05 DIAGNOSIS — D500 Iron deficiency anemia secondary to blood loss (chronic): Secondary | ICD-10-CM | POA: Diagnosis not present

## 2018-02-05 DIAGNOSIS — I8500 Esophageal varices without bleeding: Secondary | ICD-10-CM | POA: Insufficient documentation

## 2018-02-05 DIAGNOSIS — B182 Chronic viral hepatitis C: Secondary | ICD-10-CM | POA: Insufficient documentation

## 2018-02-05 DIAGNOSIS — K5521 Angiodysplasia of colon with hemorrhage: Secondary | ICD-10-CM | POA: Insufficient documentation

## 2018-02-05 DIAGNOSIS — K5790 Diverticulosis of intestine, part unspecified, without perforation or abscess without bleeding: Secondary | ICD-10-CM | POA: Diagnosis not present

## 2018-02-05 LAB — CBC WITH DIFFERENTIAL/PLATELET
Basophils Absolute: 0 10*3/uL (ref 0.0–0.1)
Basophils Relative: 1 %
Eosinophils Absolute: 0.1 10*3/uL (ref 0.0–0.5)
Eosinophils Relative: 3 %
HEMATOCRIT: 27.9 % — AB (ref 34.8–46.6)
HEMOGLOBIN: 8.3 g/dL — AB (ref 11.6–15.9)
LYMPHS ABS: 0.9 10*3/uL (ref 0.9–3.3)
Lymphocytes Relative: 21 %
MCH: 28.7 pg (ref 25.1–34.0)
MCHC: 29.7 g/dL — AB (ref 31.5–36.0)
MCV: 96.5 fL (ref 79.5–101.0)
MONOS PCT: 11 %
Monocytes Absolute: 0.5 10*3/uL (ref 0.1–0.9)
NEUTROS ABS: 2.8 10*3/uL (ref 1.5–6.5)
NEUTROS PCT: 64 %
Platelets: 312 10*3/uL (ref 145–400)
RBC: 2.89 MIL/uL — ABNORMAL LOW (ref 3.70–5.45)
RDW: 19.5 % — ABNORMAL HIGH (ref 11.2–14.5)
WBC: 4.4 10*3/uL (ref 3.9–10.3)

## 2018-02-05 LAB — IRON AND TIBC
Iron: 19 ug/dL — ABNORMAL LOW (ref 41–142)
Saturation Ratios: 5 % — ABNORMAL LOW (ref 21–57)
TIBC: 403 ug/dL (ref 236–444)
UIBC: 384 ug/dL

## 2018-02-05 LAB — FERRITIN: Ferritin: 21 ng/mL (ref 11–307)

## 2018-02-05 NOTE — Telephone Encounter (Signed)
Orders faxed to [facility]. Orders faxed to [facility] Scheduling for ABI with TBI and arterial dopplers.

## 2018-02-06 ENCOUNTER — Ambulatory Visit: Payer: Medicare Other | Admitting: Gastroenterology

## 2018-02-06 ENCOUNTER — Other Ambulatory Visit: Payer: Self-pay | Admitting: *Deleted

## 2018-02-06 DIAGNOSIS — G5791 Unspecified mononeuropathy of right lower limb: Secondary | ICD-10-CM

## 2018-02-07 ENCOUNTER — Encounter (HOSPITAL_COMMUNITY): Payer: Medicare Other

## 2018-02-10 ENCOUNTER — Telehealth: Payer: Self-pay

## 2018-02-10 ENCOUNTER — Encounter: Payer: Self-pay | Admitting: Gastroenterology

## 2018-02-10 ENCOUNTER — Ambulatory Visit (INDEPENDENT_AMBULATORY_CARE_PROVIDER_SITE_OTHER): Payer: Medicare Other | Admitting: Gastroenterology

## 2018-02-10 VITALS — BP 114/64 | HR 104 | Ht 63.0 in | Wt 203.0 lb

## 2018-02-10 DIAGNOSIS — D500 Iron deficiency anemia secondary to blood loss (chronic): Secondary | ICD-10-CM

## 2018-02-10 DIAGNOSIS — Z794 Long term (current) use of insulin: Secondary | ICD-10-CM

## 2018-02-10 DIAGNOSIS — E118 Type 2 diabetes mellitus with unspecified complications: Secondary | ICD-10-CM | POA: Diagnosis not present

## 2018-02-10 DIAGNOSIS — G933 Postviral fatigue syndrome: Secondary | ICD-10-CM | POA: Diagnosis not present

## 2018-02-10 MED ORDER — PEG-KCL-NACL-NASULF-NA ASC-C 140 G PO SOLR: 140.0000 g | ORAL | 0 refills | Status: DC

## 2018-02-10 NOTE — Telephone Encounter (Signed)
Contacted patient, made aware of lab results and that scheduling will call to set up Feraheme infusion appointment. Patient agreeable and had no questions or concerns. Scheduling message sent.

## 2018-02-10 NOTE — Progress Notes (Signed)
[facility] Progress Note  Chief Complaint: Iron deficiency anemia of chronic GI blood loss  Subjective  History:  [name] is here to follow-up after her enteroscopy in June.  She has many years of chronic small bowel AVM blood loss.  Her history was summarized in my May 2019 office note. Small bowel enteroscopy 10/29/2017 found no source of anemia or blood loss. She received an IV iron infusion on August 14. He was also seen in primary care clinic September 4 with several days of nausea and vomiting, found to have mildly elevated creatinine over baseline and a potassium of 6.6.  She was advised to have hospital admission for management, which she declined.  I reviewed the most recent primary care and hematology notes.  [name] has not had melena or bright red blood per rectum.  ROS: Cardiovascular:  no chest pain Respiratory: no dyspnea Chronic right foot pain Charcot foot.  She is able to ambulate around her home, where she lives alone. She feels sad at times her multiple medical issues and dependency on others Remainder of systems negative except as above The patient's Past Medical, Family and Social History were reviewed and are on file in the EMR.  Objective:  Med list reviewed  Current Outpatient Medications:  .  ACCU-CHEK FASTCLIX LANCETS MISC, Use to test blood glucose 3 times daily., Disp: 100 each, Rfl: 11 .  ACCU-CHEK GUIDE test strip, CHECK BLOOD SUGAR 3 TIMES A DAY, Disp: 100 each, Rfl: 11 .  allopurinol (ZYLOPRIM) 300 MG tablet, TAKE 1 TABLET BY MOUTH ONCE DAILY, Disp: 30 tablet, Rfl: 5 .  aspirin EC 81 MG tablet, Take 1 tablet (81 mg total) by mouth daily., Disp: 30 tablet, Rfl: 1 .  cetirizine (ZYRTEC) 10 MG tablet, TAKE 1 TABLET BY MOUTH DAILY, Disp: 90 tablet, Rfl: 1 .  cholecalciferol (VITAMIN D) 1000 units tablet, Take 1 tablet (1,000 Units total) by mouth daily., Disp: 100 tablet, Rfl: 2 .  colchicine 0.6 MG tablet, TAKE ONE TABLET BY MOUTH DAILY, Disp: 30  tablet, Rfl: 0 .  cromolyn (OPTICROM) 4 % ophthalmic solution, INSTILL 1 DROP INTO BOTH EYES FOUR TIMES A DAY, Disp: , Rfl: 99 .  DILT-XR 180 MG 24 hr capsule, TAKE 1 CAPSULE BY MOUTH ONCE DAILY, Disp: 30 capsule, Rfl: 2 .  feeding supplement, GLUCERNA SHAKE, (GLUCERNA SHAKE) LIQD, Take 237 mLs by mouth 3 (three) times daily between meals. (Patient taking differently: Take 237 mLs by mouth 2 (two) times daily between meals. ), Disp: 90 Can, Rfl: 0 .  ferrous sulfate 325 (65 FE) MG EC tablet, Take 1 tablet (325 mg total) by mouth daily after breakfast., Disp: 30 tablet, Rfl: 1 .  FLUoxetine (PROZAC) 20 MG capsule, TAKE 3 CAPSULES BY MOUTH DAILY, Disp: 90 capsule, Rfl: 5 .  gabapentin (NEURONTIN) 400 MG capsule, TAKE 2 CAPSULES BY MOUTH THREE TIMES A DAY, Disp: 180 capsule, Rfl: 5 .  Insulin Glargine (LANTUS) 100 UNIT/ML Solostar Pen, Inject 10 Units into the skin daily at 10 pm., Disp: 15 mL, Rfl: 2 .  Insulin Pen Needle (UNIFINE PENTIPS) 32G X 4 MM MISC, Use to inject insulin once a day. The patient is insulin requiring, ICD 10 code E11.9. The patient injects 1 times per day., Disp: 100 each, Rfl: 2 .  losartan (COZAAR) 100 MG tablet, TAKE 1 TABLET BY MOUTH EVERY DAY, Disp: 90 tablet, Rfl: 3 .  metFORMIN (GLUCOPHAGE) 1000 MG tablet, TAKE 1 TABLET (1,000 MG TOTAL) BY MOUTH  TWICE A DAY WITH A MEAL., Disp: 180 tablet, Rfl: 1 .  omeprazole (PRILOSEC) 20 MG capsule, TAKE 1 CAPSULE BY MOUTH EVERY DAY, Disp: 30 capsule, Rfl: 5 .  ondansetron (ZOFRAN) 4 MG tablet, Take 1 tablet (4 mg total) by mouth every 8 (eight) hours as needed for nausea or vomiting., Disp: 20 tablet, Rfl: 0 .  potassium chloride SA (K-DUR,KLOR-CON) 20 MEQ tablet, Take by mouth., Disp: , Rfl:  .  sodium polystyrene (KAYEXALATE) powder, Take 30g once today., Disp: 30 g, Rfl: 0 .  tobramycin-dexamethasone (TOBRADEX) ophthalmic solution, Place 1 drop into the left eye 4 (four) times daily., Disp: , Rfl: 1 .  tolnaftate (TINACTIN) 1 % cream,  Apply 1 application topically 2 (two) times daily., Disp: 30 g, Rfl: 0 .  traZODone (DESYREL) 50 MG tablet, TAKE ONE TABLET BY MOUTH AT BEDTIME, Disp: 30 tablet, Rfl: 1 .  triamcinolone cream (KENALOG) 0.1 %, Apply topically., Disp: , Rfl:  .  vitamin C (ASCORBIC ACID) 500 MG tablet, Take 500 mg by mouth every morning., Disp: , Rfl:    Vital signs in last 24 hrs: Vitals:   02/10/18 1319  BP: 114/64  Pulse: (!) 104    Physical Exam  She is awake alert, conversational, pleasant and in no acute distress.  In a motorized wheelchair with a hard right lower leg boot  HEENT: sclera anicteric, oral mucosa moist without lesions  Neck: supple, no thyromegaly, JVD or lymphadenopathy  Cardiac: RRR without murmurs, S1S2 heard, no peripheral edema  Pulm: clear to auscultation bilaterally, normal RR and effort noted  Abdomen: soft, no tenderness, with active bowel sounds. No guarding or palpable hepatosplenomegaly.  Minute exam, semirecumbent in wheelchair.  Lower abdominal incision that appears to have healed at least partially by secondary intention, similar appearance to before.  Skin; warm and dry, no jaundice or rash  Recent Labs:  CBC Latest Ref Rng & Units 02/05/2018 01/22/2018 01/02/2018  WBC 3.9 - 10.3 K/uL 4.4 6.1 7.3  Hemoglobin 11.6 - 15.9 g/dL 8.3(L) 8.3(L) 7.8(L)  Hematocrit 34.8 - 46.6 % 27.9(L) 27.8(L) 25.5(L)  Platelets 145 - 400 K/uL 312 356 314   02/05/2018: Iron 19, TIBC 403, ferritin 21 01/02/2018: Iron 21, TIBC 45, ferritin 8   CMP Latest Ref Rng & Units 01/29/2018 01/22/2018 07/15/2017  Glucose 70 - 99 mg/dL 95 109(H) 120  BUN 8 - 23 mg/dL 30(H) 21 29(H)  Creatinine 0.44 - 1.00 mg/dL 1.33(H) 1.15(H) 1.13(H)  Sodium 135 - 145 mmol/L 138 141 138  Potassium 3.5 - 5.1 mmol/L 6.6(HH) 4.8 4.7  Chloride 98 - 111 mmol/L 107 110 107  CO2 22 - 32 mmol/L 22 22 23   Calcium 8.9 - 10.3 mg/dL 9.5 9.4 9.4  Total Protein 6.5 - 8.1 g/dL - 7.2 7.0  Total Bilirubin 0.3 - 1.2 mg/dL -  <0.2(L) <0.2(L)  Alkaline Phos 38 - 126 U/L - 84 66  AST 15 - 41 U/L - 45(H) 39(H)  ALT 0 - 44 U/L - 43 30  GFR 47    @ASSESSMENTPLANBEGIN @ Assessment: Encounter Diagnoses  Name Primary?  . Iron deficiency anemia due to chronic blood loss Yes  . Type 2 diabetes mellitus with complication, with long-term current use of insulin (HCC)    Iron deficiency anemia with increasing requirements for IV iron over the last 6 months.  Previous extensive work-up for same problem, revealed small bowel AVMs.  Seems likely to be the same thing again, but possible there is a colon source.  She last had a flexible sigmoidoscopy in 2013, last full colonoscopy prior to that.   Colonoscopy is increased complexity and risk due to her medical issues including suboptimally controlled diabetes, as well as mobility issues.  She feels she could rep for an undergo the procedure.  It was scheduled at the hospital endoscopy lab due to her medical complexity and also for increased availability of endoscopic hemostasis devices if necessary.  The benefits and risks of the planned procedure were described in detail with the patient or (when appropriate) their health care proxy.  Risks were outlined as including, but not limited to, bleeding, infection, perforation, adverse medication reaction leading to cardiac or pulmonary decompensation, or pancreatitis (if ERCP).  The limitation of incomplete mucosal visualization was also discussed.  No guarantees or warranties were given.  If colonoscopy does not find a source of this anemia, it was then be presumed to be small bowel AVM blood loss, and treatment with IV iron would be the remaining therapy.   Total time 30 minutes, over half spent face-to-face with patient in counseling and coordination of care.   Nelida Meuse III

## 2018-02-10 NOTE — Telephone Encounter (Signed)
-----   Message from Truitt Merle, MD sent at 02/08/2018  1:31 PM EDT ----- Seth Bake, please let pt know her lab results. She still has moderate anemia, and low iron. Please schedule iv feraheme once for her in 1-2 weeks. She has an appointment with GI this week, please remind her to go. Thanks   Truitt Merle  02/08/2018

## 2018-02-10 NOTE — Patient Instructions (Signed)
If you are age 65 or older, your body mass index should be between 23-30. Your Body mass index is 35.96 kg/m. If this is out of the aforementioned range listed, please consider follow up with your Primary Care Provider.  If you are age 46 or younger, your body mass index should be between 19-25. Your Body mass index is 35.96 kg/m. If this is out of the aformentioned range listed, please consider follow up with your Primary Care Provider.   You have been scheduled for a colonoscopy. Please follow written instructions given to you at your visit today.  Please pick up your prep supplies at the pharmacy within the next 1-3 days. If you use inhalers (even only as needed), please bring them with you on the day of your procedure. Your physician has requested that you go to www.startemmi.com and enter the access code given to you at your visit today. This web site gives a general overview about your procedure. However, you should still follow specific instructions given to you by our office regarding your preparation for the procedure.  It was a pleasure to see you today!  Dr. Loletha Carrow

## 2018-02-11 ENCOUNTER — Ambulatory Visit (HOSPITAL_COMMUNITY)
Admission: RE | Admit: 2018-02-11 | Discharge: 2018-02-11 | Disposition: A | Discharge status: Home / Self Care | Payer: Medicare Other | Source: Ambulatory Visit | Attending: Sports Medicine | Admitting: Sports Medicine

## 2018-02-11 ENCOUNTER — Encounter (HOSPITAL_COMMUNITY): Payer: Medicare Other

## 2018-02-11 ENCOUNTER — Telehealth: Payer: Self-pay

## 2018-02-11 DIAGNOSIS — E11610 Type 2 diabetes mellitus with diabetic neuropathic arthropathy: Secondary | ICD-10-CM | POA: Insufficient documentation

## 2018-02-11 DIAGNOSIS — I739 Peripheral vascular disease, unspecified: Secondary | ICD-10-CM | POA: Diagnosis not present

## 2018-02-11 DIAGNOSIS — Z89612 Acquired absence of left leg above knee: Secondary | ICD-10-CM | POA: Diagnosis not present

## 2018-02-11 NOTE — Telephone Encounter (Signed)
Spoke with patient per Dr. Burr Medico notified her lab results, moderate anemia, low iron, recommended IV iron infusion, patient has a lab appointment on 9/25 sent scheduling message for IV iron infusion to follow.

## 2018-02-11 NOTE — Telephone Encounter (Signed)
-----   Message from Truitt Merle, MD sent at 02/08/2018  1:31 PM EDT ----- Seth Bake, please let pt know her lab results. She still has moderate anemia, and low iron. Please schedule iv feraheme once for her in 1-2 weeks. She has an appointment with GI this week, please remind her to go. Thanks   Truitt Merle  02/08/2018

## 2018-02-11 NOTE — Progress Notes (Signed)
VASCULAR LAB PRELIMINARY  ARTERIAL  ABI completed:    RIGHT    LEFT    PRESSURE WAVEFORM  PRESSURE WAVEFORM  BRACHIAL 153 Triphasic BRACHIAL 159 Triphasic  DP 183 Triphasic DP AKA   PT 204 Triphasic PT AKA   GREAT TOE 132 NA GREAT TOE AKA NA    RIGHT LEFT  ABI / TBI 1.28 / 0.83 NA AKA   Right ABI and Doppler waveforms are normal at rest. TBI is normal. Unable to obtain Left due to above the knee amputation. [place] Tanairi Cypert,RVS 02/11/2018, 3:46 PM  Kajsa Butrum, [place], RVS 02/11/2018, 3:43 PM

## 2018-02-12 NOTE — Telephone Encounter (Signed)
-----   Message from Landis Martins, [place] sent at 02/11/2018  5:37 PM EDT ----- Please let patient know that her vascular test are normal. There is no compromise of her blood flow in her right leg at this time -Dr. Cannon Kettle

## 2018-02-12 NOTE — Telephone Encounter (Signed)
I informed pt of Dr. Leeanne Rio review of vascular test. Pt asked if she needed to keep her other appt and I told her yes.

## 2018-02-13 ENCOUNTER — Telehealth: Payer: Self-pay | Admitting: Hematology

## 2018-02-13 NOTE — Telephone Encounter (Signed)
Add f/u and infusion to 9/25 lab. Spoke with patient.

## 2018-02-17 NOTE — Progress Notes (Signed)
[facility] PROGRESS NOTE  Date of Service:  02/19/2018   Patricia Grieve, MD [place] [id number]  DIAGNOSIS: Iron deficiency anemia due to chronic blood loss  History of hepatitis C.   PROBLEM LIST:  1. Recurrent iron-deficiency anemia secondary to gastrointestinal bleeding (as noted above). [name] also has required red cell transfusions in the past, most recently early December 2012. She underwent a small bowel enteroscopy by Dr. Dan Humphreys at [facility] on 08/13/2011. The enteroscope was passed up to 6 feet into the jejunum. He saw multiple AVMs, which were ablated. Two of the large AVMs bled during APC. These were in the 4th part of the duodenum very close to the ligament of Treitz. The remainder of the AVMs were in the proximal jejunum. In addition to the small bowel AV malformations, which were treated with APC, he saw some small esophageal varices which were not bleeding and also evidence of mild portal hypertensive gastropathy.  2. History of duodenal arteriovenous malformation.  3. History of hepatitis C infection.  4. Gastroesophageal reflux disease.  5. Depression.  6. Hypertension.  7. History of right Bell's palsy.  8. Status post left above-knee amputation secondary to necrotizing fasciitis 03/06/2009.  9. Neurodermatitis.  10. Previous history of alcohol usage.  11. Admission to the hospital from 12/20/2011 through 12/25/2011 for hematochezia felt to be secondary to a diverticular bleed, requiring 2 units of packed red cells.  12. Diverticulosis.  13. Elevated alpha fetoprotein, 72.3 on 11/19/2011 and 76.0 on 01/11/2012.  14. Abnormal MRI of the liver from 11/28/2011 showing diffuse and markedly low signal intensity throughout the liver as well as diffuse low signal intensity in the adrenal glands. 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 There  were no findings for cirrhosis, portal hypertension, splenomegaly or ascites, not were there any worrisome enhancing liver lesions.  15. Systolic ejection murmur.   CHIEF COMPLAIN: Follow up anemia  PRIOR TREATMENT: The patient has been heavily dependent upon IV Feraheme. Over the past year or so she has been receiving about 1 Feraheme infusion monthly on average. The patient's most recent infusions of IV Feraheme 510 mg occurred on the following dates: 06/05/2011, 07/03/2011, 07/31/2011, 08/17/2011, 08/28/2011, 10/02/2011 01/30/2012, 06/06/2012 and 09/10/2012, 05/2013, 07/2013, 08/2013, 09/2013, 11/12/2013, [01/15/2014 and 03/16/2014 at 10230mg ], 09/16/2014 and 09/23/2014, 11/2014 , 02/24/2015,  04/07/2015, 08/03/2015, 10/03/2015, 12/16/2015, 01/2016 and 02/27/2016, 09/07/16, 01/31/17, 07/15/17, 08/22/17  CURRENT TREATMENT: Feraheme prn   INTERVAL HISTORY:   [name] 67 y.o. female with history of recurrent iron-deficiency anemia due to GI bleeding is here for follow-up. She had a small bowel endoscopy on 10/29/2017 with Dr. Loletha Carrow, which was normal.  Today, she is here alone at the clinic on a wheelchair. She states that she noticed that her stool is dark.    MEDICAL HISTORY: Past Medical History:  Diagnosis Date  . Allergic rhinitis   . Anemia, iron deficiency 05/03/2011   2/2 GI AVMs - recieves iron infusions as well as periodic red blood cell transfusions  . Anxiety   . Arteriovenous malformation of duodenum 02/25/2015  . CARPAL TUNNEL RELEASE, HX OF 07/22/2008  . Colon polyps   . Diabetes mellitus without complication ([place])    type 2  . Difficulty sleeping    takes trazadone for sleep  . Diverticulosis   . GERD (gastroesophageal reflux disease)   . Hepatitis C    in SVR with  Har[facility] 12/2014-02/2015.   Marland Kitchen History of hernia repair 08/18/2012  . History of porphyria 11/30/2014   Porphyria cutanea tarda in setting of chronic Hepatitis C, frequent  IV iron infusions, and alcohol abuse   . Hypertension    . Peripheral vascular disease ([place])   . Stroke [facility]) 2010   no residual deficits  . Substance use disorder    cocaine     ALLERGIES:  is allergic to ciprofloxacin; morphine and related; penicillins; codeine; lisinopril; and feraheme [ferumoxytol].  MEDICATIONS: has a current medication list which includes the following prescription(s): aspirin ec, atenolol, cetirizine, cholecalciferol, colchicine, fluoxetine, gabapentin, hydrochlorothiazide, losartan, omeprazole, potassium chloride, trazodone, vitamin c, ferrous sulfate, and tramadol.  SURGICAL HISTORY:  Past Surgical History:  Procedure Laterality Date  . ABDOMINAL HYSTERECTOMY    . APPLICATION OF WOUND VAC N/A 06/21/2014   Procedure: APPLICATION OF WOUND VAC;  Surgeon: Coralie Keens, MD;  Location: [facility];  Service: General;  Laterality: N/A;  . CARPAL TUNNEL RELEASE     rt hand  . ENTEROSCOPY N/A 12/04/2012   Procedure: ENTEROSCOPY;  Surgeon: Beryle Beams, MD;  Location: [facility];  Service: Endoscopy;  Laterality: N/A;  . ENTEROSCOPY N/A 12/18/2012   Procedure: ENTEROSCOPY;  Surgeon: Beryle Beams, MD;  Location: [facility];  Service: Endoscopy;  Laterality: N/A;  . ENTEROSCOPY N/A 02/25/2015   Procedure: ENTEROSCOPY;  Surgeon: Inda Castle, MD;  Location: [facility] END[facility]COPY;  Service: Endoscopy;  Laterality: N/A;  . ENTEROSCOPY N/A 10/29/2017   Procedure: ENTEROSCOPY;  Surgeon: Doran Stabler, MD;  Location: [facility];  Service: Gastroenterology;  Laterality: N/A;  . ESOPHAGOGASTRODUODENOSCOPY  05/05/2011   Procedure: ESOPHAGOGASTRODUODENOSCOPY (EGD);  Surgeon: Zenovia Jarred, MD;  Location: [facility];  Service: Gastroenterology;  Laterality: N/A;  Dr. Hilarie Fredrickson will do procedure for Dr. Benson Norway Saturday.  . ESOPHAGOGASTRODUODENOSCOPY  05/08/2011   Procedure: ESOPHAGOGASTRODUODENOSCOPY (EGD);  Surgeon: Beryle Beams;  [facility];  Service: Endoscopy;  Laterality: N/A;  . ESOPHAGOGASTRODUODENOSCOPY  06/07/2011    Procedure: ESOPHAGOGASTRODUODENOSCOPY (EGD);  Surgeon: Beryle Beams, MD;  Location: [facility];  Service: Endoscopy;  Laterality: N/A;  . ESOPHAGOGASTRODUODENOSCOPY  12/20/2011   Procedure: ESOPHAGOGASTRODUODENOSCOPY (EGD);  Surgeon: Beryle Beams, MD;  Location: [facility];  Service: Endoscopy;  Laterality: N/A;  . EYE SURGERY Bilateral    cataracts  . FLEXIBLE SIGMOIDOSCOPY  12/21/2011   Procedure: FLEXIBLE SIGMOIDOSCOPY;  Surgeon: Beryle Beams, MD;  [facility];  Service: Endoscopy;  Laterality: N/A;  . HOT HEMOSTASIS  06/07/2011   Procedure: HOT HEMOSTASIS (ARGON PLASMA COAGULATION/BICAP);  Surgeon: Beryle Beams, MD;  Location: [facility];  Service: Endoscopy;  Laterality: N/A;  . INCISIONAL HERNIA REPAIR N/A 06/01/2014   Procedure: ATTEMPTED LAPAROSCOPIC AND OPEN INCISIONAL HERNIA REPAIR WITH MESH;  Surgeon: Coralie Keens, MD;  Location: [facility];  Service: General;  Laterality: N/A;  . INSERTION OF MESH N/A 06/01/2014   Procedure: INSERTION OF MESH;  Surgeon: Coralie Keens, MD;  Location: [facility];  Service: General;  Laterality: N/A;  . LAPAROTOMY  02/16/2012   Procedure: EXPLORATORY LAPAROTOMY;  Surgeon: Edward Jolly, MD;  [facility];  Service: General;  Laterality: N/A;  oversewing of anastomotic leak and rigid sigmoidoscopy  . LAPAROTOMY N/A 06/21/2014   Procedure: ABDOMINAL WOUND EXPLORATION;  Surgeon: Coralie Keens, MD;  Location: [facility];  Service: General;  Laterality: N/A;  . LEG AMPUTATION ABOVE KNEE     left  . PARTIAL COLECTOMY  02/15/2012   Procedure: PARTIAL COLECTOMY;  Surgeon: Harl Bowie, MD;  [facility];  Service: General;  Laterality: N/A;  . TONSILLECTOMY     Allergies as of 02/19/2018      Reactions   Ciprofloxacin Hives, Swelling   Morphine And Related Other (See Comments)   Makes pt sad and paranoid   Penicillins Hives, Swelling   Has patient had a PCN reaction causing immediate rash, facial/tongue/throat swelling, SOB  or lightheadedness with hypotension:YES Has patient had a PCN reaction causing severe rash involving mucus membranes or skin necrosis:UNSURE Has patient had a PCN reaction that required hospitalization:YES Has patient had a PCN reaction occurring within the last 10 years:No If all of the above answers are "NO", then may proceed with Cephalosporin use.   Codeine Hives, Swelling   Lisinopril Cough   New onset dry cough after starting lisinopril. Resolved upon switching to losartan.   Feraheme [ferumoxytol] Itching   Tolerated Feraheme 6/26 & 7/21 pre-medications prior to medication      Medication List        Accurate as of 02/19/18  9:00 PM. Always use your most recent med list.          ACCU-CHEK FASTCLIX LANCETS Misc Use to test blood glucose 3 times daily.   ACCU-CHEK GUIDE test strip Generic drug:  glucose blood CHECK BLOOD SUGAR 3 TIMES A DAY   allopurinol 300 MG tablet Commonly known as:  ZYLOPRIM TAKE 1 TABLET BY MOUTH ONCE DAILY   aspirin EC 81 MG tablet Take 1 tablet (81 mg total) by mouth daily.   cetirizine 10 MG tablet Commonly known as:  ZYRTEC TAKE 1 TABLET BY MOUTH DAILY   cholecalciferol 1000 units tablet Commonly known as:  VITAMIN D Take 1 tablet (1,000 Units total) by mouth daily.   colchicine 0.6 MG tablet TAKE ONE TABLET BY MOUTH DAILY   cromolyn 4 % ophthalmic solution Commonly known as:  OPTICROM INSTILL 1 DROP INTO BOTH EYES FOUR TIMES A DAY   DILT-XR 180 MG 24 hr capsule Generic drug:  diltiazem TAKE 1 CAPSULE BY MOUTH ONCE DAILY   feeding supplement (GLUCERNA SHAKE) Liqd Take 237 mLs by mouth 3 (three) times daily between meals.   ferrous sulfate 325 (65 FE) MG EC tablet Take 1 tablet (325 mg total) by mouth daily after breakfast.   FLUoxetine 20 MG capsule Commonly known as:  PROZAC TAKE 3 CAPSULES BY MOUTH DAILY   gabapentin 400 MG capsule Commonly known as:  NEURONTIN TAKE 2 CAPSULES BY MOUTH THREE TIMES A DAY   Insulin  Glargine 100 UNIT/ML Solostar Pen Commonly known as:  LANTUS Inject 10 Units into the skin daily at 10 pm.   Insulin Pen Needle 32G X 4 MM Misc Use to inject insulin once a day. The patient is insulin requiring, ICD 10 code E11.9. The patient injects 1 times per day.   losartan 100 MG tablet Commonly known as:  COZAAR TAKE 1 TABLET BY MOUTH EVERY DAY   metFORMIN 1000 MG tablet Commonly known as:  GLUCOPHAGE TAKE 1 TABLET (1,000 MG TOTAL) BY MOUTH TWICE A DAY WITH A MEAL.   omeprazole 20 MG capsule Commonly known as:  PRILOSEC TAKE 1 CAPSULE BY MOUTH EVERY DAY   ondansetron 4 MG tablet Commonly known as:  ZOFRAN Take 1 tablet (4 mg total) by mouth every 8 (eight) hours as needed for nausea or vomiting.   PEG-KCl-NaCl-NaSulf-Na Asc-C 140 g Solr Take 140 g by mouth as directed.   potassium chloride SA 20  MEQ tablet Commonly known as:  K-DUR,KLOR-CON Take by mouth.   sodium polystyrene powder Commonly known as:  KAYEXALATE Take 30g once today.   tobramycin-dexamethasone ophthalmic solution Commonly known as:  TOBRADEX Place 1 drop into the left eye 4 (four) times daily.   tolnaftate 1 % cream Commonly known as:  TINACTIN Apply 1 application topically 2 (two) times daily.   traZODone 50 MG tablet Commonly known as:  DESYREL TAKE ONE TABLET BY MOUTH AT BEDTIME   triamcinolone cream 0.1 % Commonly known as:  KENALOG Apply topically.   vitamin C 500 MG tablet Commonly known as:  ASCORBIC ACID Take 500 mg by mouth every morning.      REVIEW OF SYSTEMS:   Constitutional: Denies fevers, chills or abnormal weight loss;  Eyes: Denies blurriness of vision Ears, nose, mouth, throat, and face: Denies mucositis or sore throat Respiratory: Denies cough, dyspnea or wheezes Cardiovascular: Denies palpitation, chest discomfort   Gastrointestinal:  Denies nausea, heartburn or change in bowel habits (+) dark stools Skin: Negative Lymphatics: Denies new lymphadenopathy or easy  bruising Neurological:Denies numbness, tingling or new weaknesses Behavioral/Psych: Mood is stable, no new changes  All other systems were reviewed with the patient and are negative.  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  Head: Normocephalic, without obvious abnormality, atraumatic  Neck: no adenopathy, supple, symmetrical, trachea midline and thyroid not enlarged, symmetric, no tenderness/mass/nodules  Lymph nodes: Cervical adenopathy: None appreciated  Heart:regular rate and rhythm, S1, S2 normal and systolic murmur 2/6 Lung:chest clear, no wheezing, rales, normal symmetric air entry, Heart exam - S1, S2 normal, no murmur, no gallop, rate regular  Abdomen: soft, distended, normal bowel sounds, nontender EXT: No peripheral edema on right extremity; Left above the knee amputation. She is wearing a prothesis. (+) right leg and foot in a brace  Labs:  CBC Latest Ref Rng & Units 02/19/2018 02/05/2018 01/22/2018  WBC 3.9 - 10.3 K/uL 5.8 4.4 6.1  Hemoglobin 11.6 - 15.9 g/dL 9.0(L) 8.3(L) 8.3(L)  Hematocrit 34.8 - 46.6 % 29.6(L) 27.9(L) 27.8(L)  Platelets 145 - 400 K/uL 403(H) 312 356    CMP Latest Ref Rng & Units 01/29/2018 01/22/2018 07/15/2017  Glucose 70 - 99 mg/dL 95 109(H) 120  BUN 8 - 23 mg/dL 30(H) 21 29(H)  Creatinine 0.44 - 1.00 mg/dL 1.33(H) 1.15(H) 1.13(H)  Sodium 135 - 145 mmol/L 138 141 138  Potassium 3.5 - 5.1 mmol/L 6.6(HH) 4.8 4.7  Chloride 98 - 111 mmol/L 107 110 107  CO2 22 - 32 mmol/L 22 22 23   Calcium 8.9 - 10.3 mg/dL 9.5 9.4  9.4  Total Protein 6.5 - 8.1 g/dL - 7.2 7.0  Total Bilirubin 0.3 - 1.2 mg/dL - <0.2(L) <0.2(L)  Alkaline Phos 38 - 126 U/L - 84 66  AST 15 - 41 U/L - 45(H) 39(H)  ALT 0 - 44 U/L - 43 30   Results for [name], [name] (MRN [id number]) as of 02/17/2018 12:06  Ref. Range 02/05/2018 13:25  Iron Latest Ref Range: 41 - 142 ug/dL 19 (L)  UIBC Latest Units: ug/dL 384  TIBC Latest Ref Range: 236 - 444 ug/dL 403  Saturation Ratios Latest Ref Range: 21 - 57 % 5 (L)  Ferritin Latest Ref Range: 11 - 307 ng/mL 21     Procedure 10/29/2017 Small Bowel Endoscopy Findings: The examined esophagus was normal. A medium amount of food (residue) was found in the gastric body. The examined duodenum was normal. There was no evidence of significant pathology in the entire examined portion of jejunum. Impression: - Normal esophagus. - A medium amount of food (residue) in the stomach. - Normal examined duodenum. - The examined portion of the jejunum was normal. - No specimens collected. No SB AVMs seen.   RADIOGRAPHIC STUDIES: Mammogram Digital Screening Bilateral 09/21/15 IMPRESSION: No mammographic evidence of malignancy. A result letter of this screening mammogram will be mailed directly to the patient.  ASSESSMENT:  [name] 67 y.o. female with a history of   1. Iron deficiency anemia secondary to GI loss, with aspects of Anemia of Chronic Disease  -History of multiple GI bleeding in 2016, required blood transfusion, hospitalization and IV Feraheme -She previously required blood transfusion periodically, much less lately  -She has been receiving IV Feraheme every 2 months on average lately -I previously recommended close follow-up, we'll check her CBC and ferritin every 4 weeks -Due to her PCT, we'll be cautious with IV iron. Her PCT has resolved due to her successfully treated hepatitis C, I'll be okay to be more liberal to replace her iron deficiency. -She had IV Feraheme over the last 2  months. Her response was moderate.  -Labs reviewed, Hg today is 9.0 -She has been on oral iron for 5-6 years now and has had black watery stool.  -She had an EGD on 10/29/2017 with Dr. Loletha Carrow, which was normal.  - Colonoscopy is scheduled in a few weeks  -Continue to check her labs in 2 weeks, and will set up iv feraheme if ferritin <50 -Lab review today, anemia improved, her last ferritin was 21 2 weeks ago, will proceed IV Feraheme today and repeat in 2 weeks. -F/u in 6 weeks   2. Porphyria cutaneous tarda (PCT), resolved -per pt, she had significant sun sensitivity which caused skin blisters on her arms and legs, she has multiple skin pigmentations from healed skin rash on her arms. -Both her urine and serum porphyria, especially uroporphyrin and coproporphyrin all elevated.  -This is likely secondary to her hepatitis C -She has previously completed treatment for hepatitis C. Her PCT has resolved now   3. chronic hepatitis C infection -She previously tested positive for hepatitis C in 2009. She has previously completed treatment for hepatitis C in 2016 and repeated test was negative.  -liver function has previously improved -She was previously seen by GI Dr. Deatra Ina who has since left the practice. She was previously given GI [place] clinic's information to be seen.  -She is due for Liver US, and AFP for liver cancer screening. I previously contacted Dr. Jari Favre about this.  -Due to her chronic anemia, worse lately, I would suggest repeating hepatitis C to rule out HCV flare   4. DM2 --Management per PCP's office.  -Glucose was previously 341. I previously encouraged her to restart her metformin.  -Was hospitalized in March 2018 for high glucose levels greater than 1000.  -Continue to follow up closely with PCP -She monitors her BG at home  5. Left BKA  -She has a new prosthesis, she is wheelchair bound most of time, I previously encouraged her to continue physical therapy  6.  Smoking cessation  -She is still actively smoking, not ready to quit   7. Right knee pain and leg swelling  -likely secondary to osteoarthritis. She will follow up with PCP  -I previously encouraged her to use compression stock for edema, she will think about it   Plan  -continue lab every 2 weeks -IV iron today and in 2 weeks, will schedule in future if ferritin<50 or low iron level  -F/u in 6 weeks    All questions were answered. The patient knows to call the clinic with any problems, questions or concerns. We can certainly see the patient much sooner if necessary.  I spent 20 minutes counseling the patient face to face. The total time spent in the appointment was 25 minutes.  Dierdre Searles Dweik am acting as scribe for Dr. Truitt Merle.  I have reviewed the above documentation for accuracy and completeness, and I agree with the above.    Truitt Merle 02/19/2018

## 2018-02-19 ENCOUNTER — Inpatient Hospital Stay (HOSPITAL_BASED_OUTPATIENT_CLINIC_OR_DEPARTMENT_OTHER): Payer: Medicare Other | Admitting: Hematology

## 2018-02-19 ENCOUNTER — Inpatient Hospital Stay: Payer: Medicare Other

## 2018-02-19 ENCOUNTER — Encounter: Payer: Self-pay | Admitting: Hematology

## 2018-02-19 ENCOUNTER — Telehealth: Payer: Self-pay

## 2018-02-19 VITALS — BP 114/79 | HR 97 | Temp 98.6°F | Resp 18

## 2018-02-19 VITALS — BP 120/75 | HR 104 | Temp 97.9°F | Resp 20 | Ht 63.0 in | Wt 206.0 lb

## 2018-02-19 DIAGNOSIS — K5521 Angiodysplasia of colon with hemorrhage: Secondary | ICD-10-CM | POA: Diagnosis not present

## 2018-02-19 DIAGNOSIS — K922 Gastrointestinal hemorrhage, unspecified: Secondary | ICD-10-CM

## 2018-02-19 DIAGNOSIS — K5790 Diverticulosis of intestine, part unspecified, without perforation or abscess without bleeding: Secondary | ICD-10-CM | POA: Diagnosis not present

## 2018-02-19 DIAGNOSIS — I8500 Esophageal varices without bleeding: Secondary | ICD-10-CM | POA: Diagnosis not present

## 2018-02-19 DIAGNOSIS — K219 Gastro-esophageal reflux disease without esophagitis: Secondary | ICD-10-CM | POA: Diagnosis not present

## 2018-02-19 DIAGNOSIS — D500 Iron deficiency anemia secondary to blood loss (chronic): Secondary | ICD-10-CM

## 2018-02-19 DIAGNOSIS — B182 Chronic viral hepatitis C: Secondary | ICD-10-CM | POA: Diagnosis not present

## 2018-02-19 DIAGNOSIS — Z8619 Personal history of other infectious and parasitic diseases: Secondary | ICD-10-CM

## 2018-02-19 LAB — CBC WITH DIFFERENTIAL/PLATELET
BASOS PCT: 1 %
Basophils Absolute: 0.1 10*3/uL (ref 0.0–0.1)
EOS PCT: 4 %
Eosinophils Absolute: 0.2 10*3/uL (ref 0.0–0.5)
HCT: 29.6 % — ABNORMAL LOW (ref 34.8–46.6)
Hemoglobin: 9 g/dL — ABNORMAL LOW (ref 11.6–15.9)
Lymphocytes Relative: 24 %
Lymphs Abs: 1.4 10*3/uL (ref 0.9–3.3)
MCH: 27.7 pg (ref 25.1–34.0)
MCHC: 30.6 g/dL — AB (ref 31.5–36.0)
MCV: 90.4 fL (ref 79.5–101.0)
MONO ABS: 0.9 10*3/uL (ref 0.1–0.9)
MONOS PCT: 15 %
Neutro Abs: 3.3 10*3/uL (ref 1.5–6.5)
Neutrophils Relative %: 56 %
Platelets: 403 10*3/uL — ABNORMAL HIGH (ref 145–400)
RBC: 3.27 MIL/uL — ABNORMAL LOW (ref 3.70–5.45)
RDW: 19.7 % — AB (ref 11.2–14.5)
WBC: 5.8 10*3/uL (ref 3.9–10.3)

## 2018-02-19 MED ORDER — SODIUM CHLORIDE 0.9 % IV SOLN
510.0000 mg | Freq: Once | INTRAVENOUS | Status: AC
  Administered 2018-02-19: 510 mg via INTRAVENOUS
  Filled 2018-02-19: qty 17

## 2018-02-19 MED ORDER — FAMOTIDINE 20 MG PO TABS
ORAL_TABLET | ORAL | Status: AC
  Filled 2018-02-19: qty 1

## 2018-02-19 MED ORDER — DIPHENHYDRAMINE HCL 50 MG/ML IJ SOLN
50.0000 mg | Freq: Once | INTRAMUSCULAR | Status: AC
  Administered 2018-02-19: 50 mg via INTRAVENOUS

## 2018-02-19 MED ORDER — FAMOTIDINE 20 MG PO TABS
20.0000 mg | ORAL_TABLET | Freq: Once | ORAL | Status: AC
  Administered 2018-02-19: 20 mg via ORAL

## 2018-02-19 MED ORDER — SODIUM CHLORIDE 0.9 % IV SOLN
INTRAVENOUS | Status: DC
  Administered 2018-02-19: 16:00:00 via INTRAVENOUS
  Filled 2018-02-19: qty 250

## 2018-02-19 MED ORDER — DIPHENHYDRAMINE HCL 50 MG/ML IJ SOLN
INTRAMUSCULAR | Status: AC
  Filled 2018-02-19: qty 1

## 2018-02-19 NOTE — Telephone Encounter (Signed)
Printed avs and calender of upcoming appointment. Per 9/25 los 

## 2018-02-19 NOTE — Patient Instructions (Signed)

## 2018-02-20 ENCOUNTER — Other Ambulatory Visit: Payer: Self-pay | Admitting: Internal Medicine

## 2018-02-20 ENCOUNTER — Ambulatory Visit: Payer: Medicare Other

## 2018-02-20 ENCOUNTER — Ambulatory Visit: Payer: Medicare Other | Admitting: Hematology

## 2018-02-20 DIAGNOSIS — I10 Essential (primary) hypertension: Secondary | ICD-10-CM

## 2018-02-26 ENCOUNTER — Other Ambulatory Visit: Payer: Self-pay | Admitting: Pharmacist

## 2018-02-26 ENCOUNTER — Ambulatory Visit: Payer: Medicare Other

## 2018-02-26 ENCOUNTER — Ambulatory Visit (INDEPENDENT_AMBULATORY_CARE_PROVIDER_SITE_OTHER): Payer: Medicare Other | Admitting: Pharmacist

## 2018-02-26 DIAGNOSIS — E1165 Type 2 diabetes mellitus with hyperglycemia: Secondary | ICD-10-CM

## 2018-02-26 DIAGNOSIS — Z23 Encounter for immunization: Secondary | ICD-10-CM | POA: Diagnosis not present

## 2018-02-26 NOTE — Patient Instructions (Signed)
Please record the time, amount and what food drinks and activities you have while wearing the continuous glucose monitor(CGM) in the folder provided.  Bring the folder with you to follow up appointments  Do not have a CT or an MRI while wearing the CGM.   Please make an appointment for 1 week with me and a doctor for the first of two CGM downloads..   You will also return in 2 weeks to have your second download and the CGM removed.  

## 2018-02-26 NOTE — Progress Notes (Signed)
Documentation for Freestyle Libre Pro Continuous glucose monitoring Freestyle Libre Pro CGM sensor placed and started.  Patient was educated about wearing sensor, keeping food, activity and medication log and when to call office. She was educated about how to care for the sensor and not to have an MRI, CT or Diathermy while wearing the sensor. Follow up was arranged with the patient for 1 week.   Lot #: [id number] Serial #: [id number] Expiration Date: 08/26/18  Flossie Dibble, PharmD 02/26/2018 2:53 PM.

## 2018-03-04 ENCOUNTER — Other Ambulatory Visit: Payer: Self-pay

## 2018-03-04 ENCOUNTER — Ambulatory Visit (INDEPENDENT_AMBULATORY_CARE_PROVIDER_SITE_OTHER): Payer: Medicare Other | Admitting: Neurology

## 2018-03-04 ENCOUNTER — Telehealth: Payer: Self-pay | Admitting: Gastroenterology

## 2018-03-04 DIAGNOSIS — G5791 Unspecified mononeuropathy of right lower limb: Secondary | ICD-10-CM

## 2018-03-04 NOTE — Telephone Encounter (Signed)
Rx resent to [employer]. Pt aware. Also informed her that the colonoscopy is now at [place].

## 2018-03-04 NOTE — Telephone Encounter (Signed)
Patient states prep for procedure on 10.21.19 was suppose to be sent to [facility] outpatient pharmacy not the delivery pharmacy in [place]. Patient seems confused about it all and is requesting a call for instruction. Patient had ov on 9.16.19.

## 2018-03-04 NOTE — Procedures (Signed)
[facility]  [place], [place]  [place], [place] [address] Tel: [phone] Fax:  [phone] Test Date:  03/04/2018  Patient: [name] DOB: 1950-10-12 Physician: Narda Amber, DO  Sex: Female Height: 5\' 3"  Ref Phys: Verna Czech  ID#: [id number] Temp: 32.0C Technician:    Patient Complaints: This is a 67 year-old female referred for evaluation of burning of the right foot.  NCV & EMG Findings: Extensive electrodiagnostic testing of the right lower extremity shows:  1. Right sural and superficial peroneal sensory responses are within normal limits. 2. Right peroneal motor response at the extensor digitorum brevis is absent, and normal at the tibialis anterior.  Right tibial motor response is within normal limits. 3. Right tibial H reflex studies within normal limits. 4. There is no evidence of active or chronic motor axonal loss changes affecting any of the tested muscles.  Motor unit configuration and recruitment pattern is within normal limits.  Impression: This is a normal study of the right lower extremity.  In particular, there is no evidence of a large fiber sensorimotor polyneuropathy or lumbosacral radiculopathy.   ___________________________ Narda Amber, DO    Nerve Conduction Studies Anti Sensory Summary Table   Stim Site NR Peak (ms) Norm Peak (ms) P-T Amp (V) Norm P-T Amp  Right Sup Peroneal Anti Sensory (Ant Lat Mall)  32C  12 cm    2.2 <4.6 6.9 >3  Right Sural Anti Sensory (Lat Mall)  32C  Calf    2.8 <4.6 10.8 >3   Motor Summary Table   Stim Site NR Onset (ms) Norm Onset (ms) O-P Amp (mV) Norm O-P Amp Site1 Site2 Delta-0 (ms) Dist (cm) Vel (m/s) Norm Vel (m/s)  Right Peroneal Motor (Ext Dig Brev)  32C  Ankle NR  <6.0  >2.5 B Fib Ankle  0.0  >40  B Fib NR     Poplt B Fib  0.0  >40  Poplt NR            Right Peroneal TA Motor (Tib Ant)  32C  Fib Head    2.8 <4.5 4.0 >3 Poplit Fib Head 1.3 8.0 62 >40  Poplit    4.1  3.9          Right Tibial Motor (Abd Hall Brev)  32C  Ankle    4.1 <6.0 10.8 >4 Knee Ankle 8.9 39.0 44 >40  Knee    13.0  7.8          H Reflex Studies   NR H-Lat (ms) Lat Norm (ms) L-R H-Lat (ms)  Right Tibial (Gastroc)  32C     28.44 <35    EMG   Side Muscle Ins Act Fibs Psw Fasc Number Recrt Dur Dur. Amp Amp. Poly Poly. Comment  Right AntTibialis Nml Nml Nml Nml Nml Nml Nml Nml Nml Nml Nml Nml N/A  Right Gastroc Nml Nml Nml Nml Nml Nml Nml Nml Nml Nml Nml Nml N/A  Right Flex Dig Long Nml Nml Nml Nml 1- Mod Nml Nml Nml Nml Nml Nml N/A  Right RectFemoris Nml Nml Nml Nml Nml Nml Nml Nml Nml Nml Nml Nml N/A  Right GluteusMed Nml Nml Nml Nml Nml Nml Nml Nml Nml Nml Nml Nml N/A      Waveforms:

## 2018-03-05 ENCOUNTER — Ambulatory Visit: Payer: Medicare Other

## 2018-03-05 ENCOUNTER — Inpatient Hospital Stay: Payer: Medicare Other

## 2018-03-05 ENCOUNTER — Other Ambulatory Visit: Payer: Medicare Other

## 2018-03-05 ENCOUNTER — Inpatient Hospital Stay: Payer: Medicare Other | Attending: Nurse Practitioner

## 2018-03-05 VITALS — BP 128/77 | HR 91 | Temp 98.7°F | Resp 18

## 2018-03-05 DIAGNOSIS — D500 Iron deficiency anemia secondary to blood loss (chronic): Secondary | ICD-10-CM | POA: Diagnosis not present

## 2018-03-05 DIAGNOSIS — Z79899 Other long term (current) drug therapy: Secondary | ICD-10-CM | POA: Insufficient documentation

## 2018-03-05 DIAGNOSIS — K922 Gastrointestinal hemorrhage, unspecified: Secondary | ICD-10-CM

## 2018-03-05 LAB — CBC WITH DIFFERENTIAL/PLATELET
ABS IMMATURE GRANULOCYTES: 0.11 10*3/uL — AB (ref 0.00–0.07)
Basophils Absolute: 0 10*3/uL (ref 0.0–0.1)
Basophils Relative: 0 %
EOS PCT: 3 %
Eosinophils Absolute: 0.2 10*3/uL (ref 0.0–0.5)
HCT: 30.8 % — ABNORMAL LOW (ref 36.0–46.0)
HEMOGLOBIN: 9.1 g/dL — AB (ref 12.0–15.0)
Immature Granulocytes: 2 %
LYMPHS PCT: 20 %
Lymphs Abs: 1.2 10*3/uL (ref 0.7–4.0)
MCH: 29.3 pg (ref 26.0–34.0)
MCHC: 29.5 g/dL — AB (ref 30.0–36.0)
MCV: 99 fL (ref 80.0–100.0)
MONO ABS: 0.8 10*3/uL (ref 0.1–1.0)
MONOS PCT: 12 %
NEUTROS ABS: 3.9 10*3/uL (ref 1.7–7.7)
Neutrophils Relative %: 63 %
Platelets: 312 10*3/uL (ref 150–400)
RBC: 3.11 MIL/uL — ABNORMAL LOW (ref 3.87–5.11)
RDW: 21.4 % — ABNORMAL HIGH (ref 11.5–15.5)
WBC: 6.1 10*3/uL (ref 4.0–10.5)
nRBC: 0 % (ref 0.0–0.2)

## 2018-03-05 LAB — IRON AND TIBC
Iron: 376 ug/dL — ABNORMAL HIGH (ref 41–142)
SATURATION RATIOS: 96 % — AB (ref 21–57)
TIBC: 390 ug/dL (ref 236–444)
UIBC: 14 ug/dL

## 2018-03-05 LAB — FERRITIN: Ferritin: 120 ng/mL (ref 11–307)

## 2018-03-05 MED ORDER — FAMOTIDINE IN NACL 20-0.9 MG/50ML-% IV SOLN: 20.0000 mg | Freq: Once | INTRAVENOUS | Status: DC

## 2018-03-05 MED ORDER — DIPHENHYDRAMINE HCL 50 MG/ML IJ SOLN
INTRAMUSCULAR | Status: AC
  Filled 2018-03-05: qty 1

## 2018-03-05 MED ORDER — FAMOTIDINE 20 MG PO TABS
20.0000 mg | ORAL_TABLET | Freq: Once | ORAL | Status: AC
  Administered 2018-03-05: 20 mg via ORAL

## 2018-03-05 MED ORDER — DIPHENHYDRAMINE HCL 50 MG/ML IJ SOLN
50.0000 mg | Freq: Once | INTRAMUSCULAR | Status: AC
  Administered 2018-03-05: 50 mg via INTRAVENOUS

## 2018-03-05 MED ORDER — SODIUM CHLORIDE 0.9 % IV SOLN
510.0000 mg | Freq: Once | INTRAVENOUS | Status: AC
  Administered 2018-03-05: 510 mg via INTRAVENOUS
  Filled 2018-03-05: qty 17

## 2018-03-05 MED ORDER — FAMOTIDINE 20 MG PO TABS
ORAL_TABLET | ORAL | Status: AC
  Filled 2018-03-05: qty 1

## 2018-03-05 NOTE — Patient Instructions (Signed)

## 2018-03-06 ENCOUNTER — Ambulatory Visit: Payer: Medicare Other

## 2018-03-06 ENCOUNTER — Encounter: Payer: Medicare Other | Admitting: Dietician

## 2018-03-10 ENCOUNTER — Telehealth: Payer: Self-pay | Admitting: Emergency Medicine

## 2018-03-10 DIAGNOSIS — E119 Type 2 diabetes mellitus without complications: Secondary | ICD-10-CM | POA: Diagnosis not present

## 2018-03-10 NOTE — Telephone Encounter (Addendum)
Pt verbalized understanding   ----- Message from Alla Feeling, NP sent at 03/07/2018  5:07 PM EDT ----- Please let her know iron significantly improved with IV Feraheme. Keep labs appts as scheduled.  Thanks, Regan Rakers NP

## 2018-03-12 ENCOUNTER — Telehealth: Payer: Self-pay | Admitting: Gastroenterology

## 2018-03-12 ENCOUNTER — Other Ambulatory Visit: Payer: Self-pay

## 2018-03-12 MED ORDER — PEG-KCL-NACL-NASULF-NA ASC-C 140 G PO SOLR: 140.0000 g | ORAL | 0 refills | Status: DC

## 2018-03-12 NOTE — Telephone Encounter (Signed)
Pt states [facility] don't have the prescription

## 2018-03-12 NOTE — Telephone Encounter (Signed)
Pt aware to pick up free kit.

## 2018-03-12 NOTE — Telephone Encounter (Signed)
Done

## 2018-03-13 ENCOUNTER — Other Ambulatory Visit: Payer: Self-pay

## 2018-03-13 DIAGNOSIS — G933 Postviral fatigue syndrome: Secondary | ICD-10-CM | POA: Diagnosis not present

## 2018-03-17 ENCOUNTER — Encounter (HOSPITAL_COMMUNITY): Payer: Self-pay | Admitting: *Deleted

## 2018-03-17 ENCOUNTER — Encounter (HOSPITAL_COMMUNITY)
Admission: RE | Disposition: A | Discharge status: Home / Self Care | Payer: Self-pay | Source: Ambulatory Visit | Attending: Gastroenterology

## 2018-03-17 ENCOUNTER — Ambulatory Visit (HOSPITAL_COMMUNITY): Payer: Medicare Other | Admitting: Certified Registered Nurse Anesthetist

## 2018-03-17 ENCOUNTER — Ambulatory Visit (HOSPITAL_COMMUNITY)
Admission: RE | Admit: 2018-03-17 | Discharge: 2018-03-17 | Disposition: A | Discharge status: Home / Self Care | Payer: Medicare Other | Source: Ambulatory Visit | Attending: Gastroenterology | Admitting: Gastroenterology

## 2018-03-17 ENCOUNTER — Other Ambulatory Visit: Payer: Self-pay

## 2018-03-17 DIAGNOSIS — D500 Iron deficiency anemia secondary to blood loss (chronic): Secondary | ICD-10-CM | POA: Diagnosis not present

## 2018-03-17 DIAGNOSIS — Z980 Intestinal bypass and anastomosis status: Secondary | ICD-10-CM | POA: Diagnosis not present

## 2018-03-17 DIAGNOSIS — Z880 Allergy status to penicillin: Secondary | ICD-10-CM | POA: Diagnosis not present

## 2018-03-17 DIAGNOSIS — E1151 Type 2 diabetes mellitus with diabetic peripheral angiopathy without gangrene: Secondary | ICD-10-CM | POA: Diagnosis not present

## 2018-03-17 DIAGNOSIS — K5520 Angiodysplasia of colon without hemorrhage: Secondary | ICD-10-CM | POA: Insufficient documentation

## 2018-03-17 DIAGNOSIS — Z79899 Other long term (current) drug therapy: Secondary | ICD-10-CM | POA: Diagnosis not present

## 2018-03-17 DIAGNOSIS — F419 Anxiety disorder, unspecified: Secondary | ICD-10-CM | POA: Diagnosis not present

## 2018-03-17 DIAGNOSIS — K219 Gastro-esophageal reflux disease without esophagitis: Secondary | ICD-10-CM | POA: Insufficient documentation

## 2018-03-17 DIAGNOSIS — I10 Essential (primary) hypertension: Secondary | ICD-10-CM | POA: Diagnosis not present

## 2018-03-17 DIAGNOSIS — Z8619 Personal history of other infectious and parasitic diseases: Secondary | ICD-10-CM | POA: Insufficient documentation

## 2018-03-17 DIAGNOSIS — Z8673 Personal history of transient ischemic attack (TIA), and cerebral infarction without residual deficits: Secondary | ICD-10-CM | POA: Insufficient documentation

## 2018-03-17 DIAGNOSIS — Z9049 Acquired absence of other specified parts of digestive tract: Secondary | ICD-10-CM | POA: Insufficient documentation

## 2018-03-17 DIAGNOSIS — F17200 Nicotine dependence, unspecified, uncomplicated: Secondary | ICD-10-CM | POA: Insufficient documentation

## 2018-03-17 DIAGNOSIS — Z885 Allergy status to narcotic agent status: Secondary | ICD-10-CM | POA: Diagnosis not present

## 2018-03-17 DIAGNOSIS — Z888 Allergy status to other drugs, medicaments and biological substances status: Secondary | ICD-10-CM | POA: Insufficient documentation

## 2018-03-17 DIAGNOSIS — E118 Type 2 diabetes mellitus with unspecified complications: Secondary | ICD-10-CM

## 2018-03-17 DIAGNOSIS — Z794 Long term (current) use of insulin: Secondary | ICD-10-CM | POA: Diagnosis not present

## 2018-03-17 HISTORY — PX: COLONOSCOPY WITH PROPOFOL: SHX5780

## 2018-03-17 SURGERY — COLONOSCOPY WITH PROPOFOL
Anesthesia: Monitor Anesthesia Care

## 2018-03-17 MED ORDER — LACTATED RINGERS IV SOLN
INTRAVENOUS | Status: AC | PRN
  Administered 2018-03-17: 1000 mL via INTRAVENOUS

## 2018-03-17 MED ORDER — SODIUM CHLORIDE 0.9 % IV SOLN: INTRAVENOUS | Status: DC

## 2018-03-17 MED ORDER — PROPOFOL 500 MG/50ML IV EMUL
INTRAVENOUS | Status: DC | PRN
  Administered 2018-03-17: 100 ug/kg/min via INTRAVENOUS

## 2018-03-17 MED ORDER — PROPOFOL 10 MG/ML IV BOLUS
INTRAVENOUS | Status: DC | PRN
  Administered 2018-03-17: 50 mg via INTRAVENOUS

## 2018-03-17 SURGICAL SUPPLY — 21 items

## 2018-03-17 NOTE — Discharge Instructions (Signed)

## 2018-03-17 NOTE — Op Note (Signed)
[facility] Patient Name: [name] Procedure Date: 03/17/2018 MRN: [id number] Attending MD: Estill Cotta. Loletha Carrow , MD Date of Birth: [birth year] CSN: [id number] Age: 67 Admit Type: Outpatient Procedure:                Colonoscopy Indications:              Iron deficiency anemia secondary to chronic blood                            loss (normal enteroscopy 10/2017 - extensive workup                            for IDA in 2013 revealing SB AVMs) Providers:                Mallie Mussel [name], MD, Elmer Ramp. Tilden Dome, RN, Charolette Child, Technician, Stephanie [place] CRNA Referring MD:             Truitt Merle, MD Medicines:                Monitored Anesthesia Care Complications:            No immediate complications. Estimated Blood Loss:     Estimated blood loss: none. Procedure:                Pre-Anesthesia Assessment:                           - Prior to the procedure, a History and Physical                            was performed, and patient medications and                            allergies were reviewed. The patient's tolerance of                            previous anesthesia was also reviewed. The risks                            and benefits of the procedure and the sedation                            options and risks were discussed with the patient.                            All questions were answered, and informed consent                            was obtained. Anticoagulants: The patient has taken                            aspirin. It was decided not to withhold this  medication prior to the procedure. ASA Grade                            Assessment: III - A patient with severe systemic                            disease. After reviewing the risks and benefits,                            the patient was deemed in satisfactory condition to                            undergo the procedure.  After obtaining informed consent, the colonoscope                            was passed under direct vision. Throughout the                            procedure, the patient's blood pressure, pulse, and                            oxygen saturations were monitored continuously. The                            CF-HQ190L ([id number]) Olympus adult colonoscope was                            introduced through the anus and advanced to the the                            cecum, identified by appendiceal orifice and                            ileocecal valve. The colonoscopy was performed                            without difficulty. The patient tolerated the                            procedure well. The quality of the bowel                            preparation was good after lavage. The ileocecal                            valve, appendiceal orifice, and rectum were                            photographed. Scope In: 8:56:24 AM Scope Out: 9:06:58 AM Scope Withdrawal Time: 0 hours 5 minutes 50 seconds  Total Procedure Duration: 0 hours 10 minutes 34 seconds  Findings:      The digital rectal exam findings include decreased sphincter tone.      There was evidence of a prior  end-to-side colo-colonic anastomosis in       the recto-sigmoid colon. This was patent and was characterized by       healthy appearing mucosa.      The exam was otherwise without abnormality on direct and retroflexion       views. Impression:               - Decreased sphincter tone found on digital rectal                            exam.                           - Patent end-to-side colo-colonic anastomosis,                            characterized by healthy appearing mucosa.                           - The examination was otherwise normal on direct                            and retroflexion views.                           - No specimens collected.                           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 If so, the source is beyond                            the reach of enteroscopic intervention. Moderate Sedation:      N/A- Per Anesthesia Care Recommendation:           - Patient has a contact number available for                            emergencies. The signs and symptoms of potential                            delayed complications were discussed with the                            patient. Return to normal activities tomorrow.                            Written discharge instructions were provided to the                            patient.                           - Resume previous diet.                           -  Continue present medications.                           - No recommendation at this time regarding repeat                            colonoscopy.                           - Maintain regular follow up with hematology clinic                            for periodic IV iron (and PRBC transfusion as                            needed). Procedure Code(s):        --- Professional ---                           [phone], Colonoscopy, flexible; diagnostic, including                            collection of specimen(s) by brushing or washing,                            when performed (separate procedure) Diagnosis Code(s):        --- Professional ---                           D50.0, Iron deficiency anemia secondary to blood                            loss (chronic) CPT copyright 2018 American Medical Association. All rights reserved. The codes documented in this report are preliminary and upon coder review may  be revised to meet current compliance requirements. Elvert Cumpton [name], MD 03/17/2018 9:13:48 AM This report has been signed electronically. Number of Addenda: 0

## 2018-03-17 NOTE — Anesthesia Postprocedure Evaluation (Signed)
Anesthesia Post Note  Patient: [name]  Procedure(s) Performed: COLONOSCOPY WITH PROPOFOL (N/A )     Patient location during evaluation: PACU Anesthesia Type: MAC Level of consciousness: awake and alert Pain management: pain level controlled Vital Signs Assessment: post-procedure vital signs reviewed and stable Respiratory status: spontaneous breathing, nonlabored ventilation, respiratory function stable and patient connected to nasal cannula oxygen Cardiovascular status: stable and blood pressure returned to baseline Postop Assessment: no apparent nausea or vomiting Anesthetic complications: no    Last Vitals:  Vitals:   03/17/18 0925 03/17/18 0940  BP: 115/78 123/68  Pulse: 99 99  Resp: 18 (!) 24  Temp:    SpO2: 94% 95%    Last Pain:  Vitals:   03/17/18 0912  TempSrc: Oral  PainSc: 0-No pain                 Effie Berkshire

## 2018-03-17 NOTE — H&P (Signed)
History:  This patient presents for endoscopic testing for Iron Deficiency anemia See 02/10/18 office note for details.  [name] Referring physician: [name], MD  Past Medical History: Past Medical History:  Diagnosis Date  . Allergic rhinitis   . Anemia, iron deficiency 05/03/2011   2/2 GI AVMs - recieves iron infusions as well as periodic red blood cell transfusions  . Anxiety   . Arteriovenous malformation of duodenum 02/25/2015  . CARPAL TUNNEL RELEASE, HX OF 07/22/2008  . Colon polyps   . Diabetes mellitus without complication ([place])    type 2  . Difficulty sleeping    takes trazadone for sleep  . Diverticulosis   . GERD (gastroesophageal reflux disease)   . Hepatitis C    in SVR with Harvoni 12/2014-02/2015.   Marland Kitchen History of hernia repair 08/18/2012  . History of porphyria 11/30/2014   Porphyria cutanea tarda in setting of chronic Hepatitis C, frequent  IV iron infusions, and alcohol abuse   . Hypertension   . Peripheral vascular disease ([place])   . Stroke [facility]) 2010   no residual deficits  . Substance use disorder    cocaine     Past Surgical History: Past Surgical History:  Procedure Laterality Date  . ABDOMINAL HYSTERECTOMY    . APPLICATION OF WOUND VAC N/A 06/21/2014   Procedure: APPLICATION OF WOUND VAC;  Surgeon: Coralie Keens, MD;  Location: [facility];  Service: General;  Laterality: N/A;  . CARPAL TUNNEL RELEASE     rt hand  . ENTEROSCOPY N/A 12/04/2012   Procedure: ENTEROSCOPY;  Surgeon: Beryle Beams, MD;  Location: [facility];  Service: Endoscopy;  Laterality: N/A;  . ENTEROSCOPY N/A 12/18/2012   Procedure: ENTEROSCOPY;  Surgeon: Beryle Beams, MD;  Location: [facility];  Service: Endoscopy;  Laterality: N/A;  . ENTEROSCOPY N/A 02/25/2015   Procedure: ENTEROSCOPY;  Surgeon: Inda Castle, MD;  Location[facility];  Service: Endoscopy;  Laterality: N/A;  . ENTEROSCOPY N/A 10/29/2017   Procedure: ENTEROSCOPY;  Surgeon: Doran Stabler,  MD;  Location: [facility];  Service: Gastroenterology;  Laterality: N/A;  . ESOPHAGOGASTRODUODENOSCOPY  05/05/2011   Procedure: ESOPHAGOGASTRODUODENOSCOPY (EGD);  Surgeon: Zenovia Jarred, MD;  Location[facility];  Service: Gastroenterology;  Laterality: N/A;  Dr. Hilarie Fredrickson will do procedure for Dr. Benson Norway Saturday.  . ESOPHAGOGASTRODUODENOSCOPY  05/08/2011   Procedure: ESOPHAGOGASTRODUODENOSCOPY (EGD);  Surgeon: Beryle Beams;  Location: [facility];  Service: Endoscopy;  Laterality: N/A;  . ESOPHAGOGASTRODUODENOSCOPY  06/07/2011   Procedure: ESOPHAGOGASTRODUODENOSCOPY (EGD);  Surgeon: Beryle Beams, MD;  Location[facility];  Service: Endoscopy;  Laterality: N/A;  . ESOPHAGOGASTRODUODENOSCOPY  12/20/2011   Procedure: ESOPHAGOGASTRODUODENOSCOPY (EGD);  Surgeon: Beryle Beams, MD;  Location: [facility];  Service: Endoscopy;  Laterality: N/A;  . EYE SURGERY Bilateral    cataracts  . FLEXIBLE SIGMOIDOSCOPY  12/21/2011   Procedure: FLEXIBLE SIGMOIDOSCOPY;  Surgeon: Beryle Beams, MD;  Location: [facility];  Service: Endoscopy;  Laterality: N/A;  . HOT HEMOSTASIS  06/07/2011   Procedure: HOT HEMOSTASIS (ARGON PLASMA COAGULATION/BICAP);  Surgeon: Beryle Beams, MD;  Location: [facility];  Service: Endoscopy;  Laterality: N/A;  . INCISIONAL HERNIA REPAIR N/A 06/01/2014   Procedure: ATTEMPTED LAPAROSCOPIC AND OPEN INCISIONAL HERNIA REPAIR WITH MESH;  Surgeon: Coralie Keens, MD;  Location: [facility];  Service: General;  Laterality: N/A;  . INSERTION OF MESH N/A 06/01/2014   Procedure: INSERTION OF MESH;  Surgeon: Coralie Keens, MD;  Location: [facility];  Service: General;  Laterality: N/A;  . LAPAROTOMY  02/16/2012   Procedure: EXPLORATORY LAPAROTOMY;  Surgeon: Edward Jolly, MD;  Location: [facility];  Service: General;  Laterality: N/A;  oversewing of anastomotic leak and rigid sigmoidoscopy  . LAPAROTOMY N/A 06/21/2014   Procedure: ABDOMINAL WOUND EXPLORATION;  Surgeon: Coralie Keens, MD;   Location: [facility];  Service: General;  Laterality: N/A;  . LEG AMPUTATION ABOVE KNEE     left  . PARTIAL COLECTOMY  02/15/2012   Procedure: PARTIAL COLECTOMY;  Surgeon: Harl Bowie, MD;  Location: [facility];  Service: General;  Laterality: N/A;  . TONSILLECTOMY      Allergies: Allergies  Allergen Reactions  . Ciprofloxacin Hives and Swelling  . Morphine And Related Other (See Comments)    Makes pt sad and paranoid  . Penicillins Hives and Swelling    Has patient had a PCN reaction causing immediate rash, facial/tongue/throat swelling, SOB or lightheadedness with hypotension:YES Has patient had a PCN reaction causing severe rash involving mucus membranes or skin necrosis:UNSURE Has patient had a PCN reaction that required hospitalization:YES Has patient had a PCN reaction occurring within the last 10 years:No If all of the above answers are "NO", then may proceed with Cephalosporin use.   . Codeine Hives and Swelling  . Lisinopril Cough    New onset dry cough after starting lisinopril. Resolved upon switching to losartan.  Shirlean Kelly [Ferumoxytol] Itching    Tolerated Feraheme 6/26 & 7/21 pre-medications prior to medication    Outpatient Meds: Current Facility-Administered Medications  Medication Dose Route Frequency Provider Last Rate Last Dose  . 0.9 %  sodium chloride infusion   Intravenous Continuous Danis, Estill Cotta III, MD      . lactated ringers infusion    Continuous PRN Nelida Meuse III, MD   1,000 mL at 03/17/18 0743      ___________________________________________________________________ Objective   Exam:  BP 128/75   Temp 98.2 F (36.8 C) (Oral)   Resp 18   Ht 5\' 1"  (1.549 m)   Wt 93.4 kg   SpO2 95%   BMI 38.91 kg/m    CV: RRR without murmur, S1/S2, no JVD, no peripheral edema  Resp: clear to auscultation bilaterally, normal RR and effort noted  GI: soft, no tenderness, with active bowel sounds. No guarding or palpable organomegaly  noted   Assessment:  Iron Def anemia  Plan:  Colonoscopy   Nelida Meuse III

## 2018-03-17 NOTE — Interval H&P Note (Signed)
History and Physical Interval Note:  03/17/2018 8:45 AM  [name]  has presented today for surgery, with the diagnosis of anemia  The various methods of treatment have been discussed with the patient and family. After consideration of risks, benefits and other options for treatment, the patient has consented to  Procedure(s): COLONOSCOPY WITH PROPOFOL (N/A) as a surgical intervention .  The patient's history has been reviewed, patient examined, no change in status, stable for surgery.  I have reviewed the patient's chart and labs.  Questions were answered to the patient's satisfaction.     Nelida Meuse III

## 2018-03-17 NOTE — Anesthesia Preprocedure Evaluation (Addendum)
Anesthesia Evaluation  Patient identified by MRN, date of birth, ID band Patient awake    Reviewed: Allergy & Precautions, NPO status , Patient's Chart, lab work & pertinent test results  Airway Mallampati: II  TM Distance: >3 FB Neck ROM: Full    Dental  (+) Upper Dentures   Pulmonary Current Smoker,    breath sounds clear to auscultation       Cardiovascular hypertension, Pt. on medications + Peripheral Vascular Disease   Rhythm:Regular Rate:Normal     Neuro/Psych Anxiety Depression  Neuromuscular disease CVA    GI/Hepatic GERD  Medicated,(+) Hepatitis -, C  Endo/Other  diabetes, Type 2, Insulin Dependent, Oral Hypoglycemic Agents  Renal/GU      Musculoskeletal  (+) Arthritis ,   Abdominal (+) + obese,   Peds  Hematology   Anesthesia Other Findings   Reproductive/Obstetrics                            Lab Results  Component Value Date   WBC 6.1 03/05/2018   HGB 9.1 (L) 03/05/2018   HCT 30.8 (L) 03/05/2018   MCV 99.0 03/05/2018   PLT 312 03/05/2018   Lab Results  Component Value Date   CREATININE 1.33 (H) 01/29/2018   BUN 30 (H) 01/29/2018   NA 138 01/29/2018   K 6.6 (HH) 01/29/2018   CL 107 01/29/2018   CO2 22 01/29/2018   Lab Results  Component Value Date   INR 1.05 08/17/2016   INR 1.13 02/23/2015   INR 1.09 11/30/2014   EKG: sinus tachycardia, 1st degree AV block.  Echo: - Left ventricle: The cavity size was normal. There was moderate   concentric hypertrophy. Systolic function was normal. The   estimated ejection fraction was in the range of 50% to 55%.   Diffuse hypokinesis. Doppler parameters are consistent with   abnormal left ventricular relaxation (grade 1 diastolic   dysfunction). - Aortic valve: There was mild regurgitation. - Aortic root: The aortic root was normal in size. - Left atrium: The atrium was mildly dilated. - Right ventricle: Systolic function  was normal. - Right atrium: The atrium was normal in size. - Tricuspid valve: There was mild regurgitation. - Pulmonic valve: There was no regurgitation. - Pulmonary arteries: Systolic pressure was within the normal   range. - Inferior vena cava: The vessel was normal in size. - Pericardium, extracardiac: There was no pericardial effusion  Anesthesia Physical Anesthesia Plan  ASA: III  Anesthesia Plan: MAC   Post-op Pain Management:    Induction: Intravenous  PONV Risk Score and Plan: 1 and Ondansetron and Propofol infusion  Airway Management Planned: Simple Face Mask  Additional Equipment: None  Intra-op Plan:   Post-operative Plan:   Informed Consent: I have reviewed the patients History and Physical, chart, labs and discussed the procedure including the risks, benefits and alternatives for the proposed anesthesia with the patient or authorized representative who has indicated his/her understanding and acceptance.     Plan Discussed with: CRNA  Anesthesia Plan Comments:        Anesthesia Quick Evaluation

## 2018-03-17 NOTE — Transfer of Care (Signed)
Immediate Anesthesia Transfer of Care Note  Patient: [name]  Procedure(s) Performed: COLONOSCOPY WITH PROPOFOL (N/A )  Patient Location: PACU  Anesthesia Type:MAC  Level of Consciousness: awake, alert  and oriented  Airway & Oxygen Therapy: Patient Spontanous Breathing and Patient connected to face mask oxygen  Post-op Assessment: Report given to RN and Post -op Vital signs reviewed and stable  Post vital signs: Reviewed and stable  Last Vitals:  Vitals Value Taken Time  BP 91/70 03/17/2018  9:10 AM  Temp    Pulse 106 03/17/2018  9:13 AM  Resp 16 03/17/2018  9:12 AM  SpO2 91 % 03/17/2018  9:13 AM  Vitals shown include unvalidated device data.  Last Pain:  Vitals:   03/17/18 0729  TempSrc: Oral  PainSc: 0-No pain         Complications: No apparent anesthesia complications

## 2018-03-18 LAB — POCT I-STAT 4, (NA,K, GLUC, HGB,HCT)
Glucose, Bld: 137 mg/dL — ABNORMAL HIGH (ref 70–99)
HCT: 34 % — ABNORMAL LOW (ref 36.0–46.0)
HEMOGLOBIN: 11.6 g/dL — AB (ref 12.0–15.0)
Potassium: 4.4 mmol/L (ref 3.5–5.1)
SODIUM: 146 mmol/L — AB (ref 135–145)

## 2018-03-19 ENCOUNTER — Encounter (HOSPITAL_COMMUNITY): Payer: Self-pay | Admitting: Gastroenterology

## 2018-03-20 ENCOUNTER — Ambulatory Visit: Payer: Medicare Other | Admitting: Hematology

## 2018-03-20 ENCOUNTER — Other Ambulatory Visit: Payer: Self-pay | Admitting: Internal Medicine

## 2018-03-20 ENCOUNTER — Other Ambulatory Visit: Payer: Medicare Other

## 2018-03-20 DIAGNOSIS — M109 Gout, unspecified: Secondary | ICD-10-CM

## 2018-04-04 ENCOUNTER — Inpatient Hospital Stay: Payer: Medicare Other

## 2018-04-04 ENCOUNTER — Inpatient Hospital Stay (HOSPITAL_BASED_OUTPATIENT_CLINIC_OR_DEPARTMENT_OTHER): Payer: Medicare Other | Admitting: Hematology

## 2018-04-04 ENCOUNTER — Inpatient Hospital Stay: Payer: Medicare Other | Attending: Nurse Practitioner

## 2018-04-04 VITALS — BP 113/76 | HR 96 | Resp 20

## 2018-04-04 VITALS — BP 131/88 | HR 105 | Temp 98.2°F | Resp 18 | Ht 61.0 in | Wt 205.0 lb

## 2018-04-04 DIAGNOSIS — F418 Other specified anxiety disorders: Secondary | ICD-10-CM | POA: Diagnosis not present

## 2018-04-04 DIAGNOSIS — D500 Iron deficiency anemia secondary to blood loss (chronic): Secondary | ICD-10-CM

## 2018-04-04 DIAGNOSIS — M25561 Pain in right knee: Secondary | ICD-10-CM

## 2018-04-04 DIAGNOSIS — R011 Cardiac murmur, unspecified: Secondary | ICD-10-CM

## 2018-04-04 DIAGNOSIS — F17210 Nicotine dependence, cigarettes, uncomplicated: Secondary | ICD-10-CM

## 2018-04-04 DIAGNOSIS — I10 Essential (primary) hypertension: Secondary | ICD-10-CM

## 2018-04-04 DIAGNOSIS — K219 Gastro-esophageal reflux disease without esophagitis: Secondary | ICD-10-CM | POA: Insufficient documentation

## 2018-04-04 DIAGNOSIS — K922 Gastrointestinal hemorrhage, unspecified: Secondary | ICD-10-CM

## 2018-04-04 DIAGNOSIS — B182 Chronic viral hepatitis C: Secondary | ICD-10-CM

## 2018-04-04 DIAGNOSIS — I493 Ventricular premature depolarization: Secondary | ICD-10-CM | POA: Insufficient documentation

## 2018-04-04 DIAGNOSIS — Z794 Long term (current) use of insulin: Secondary | ICD-10-CM | POA: Insufficient documentation

## 2018-04-04 DIAGNOSIS — Z89512 Acquired absence of left leg below knee: Secondary | ICD-10-CM | POA: Diagnosis not present

## 2018-04-04 DIAGNOSIS — I8500 Esophageal varices without bleeding: Secondary | ICD-10-CM | POA: Diagnosis not present

## 2018-04-04 DIAGNOSIS — Z8619 Personal history of other infectious and parasitic diseases: Secondary | ICD-10-CM

## 2018-04-04 DIAGNOSIS — M7989 Other specified soft tissue disorders: Secondary | ICD-10-CM | POA: Insufficient documentation

## 2018-04-04 DIAGNOSIS — Z7982 Long term (current) use of aspirin: Secondary | ICD-10-CM

## 2018-04-04 DIAGNOSIS — Z8673 Personal history of transient ischemic attack (TIA), and cerebral infarction without residual deficits: Secondary | ICD-10-CM

## 2018-04-04 DIAGNOSIS — Z79899 Other long term (current) drug therapy: Secondary | ICD-10-CM | POA: Insufficient documentation

## 2018-04-04 DIAGNOSIS — E1165 Type 2 diabetes mellitus with hyperglycemia: Secondary | ICD-10-CM

## 2018-04-04 LAB — CBC WITH DIFFERENTIAL/PLATELET
Abs Immature Granulocytes: 0.08 10*3/uL — ABNORMAL HIGH (ref 0.00–0.07)
Basophils Absolute: 0 10*3/uL (ref 0.0–0.1)
Basophils Relative: 0 %
Eosinophils Absolute: 0.2 10*3/uL (ref 0.0–0.5)
Eosinophils Relative: 2 %
HCT: 35.6 % — ABNORMAL LOW (ref 36.0–46.0)
Hemoglobin: 10.6 g/dL — ABNORMAL LOW (ref 12.0–15.0)
IMMATURE GRANULOCYTES: 1 %
LYMPHS ABS: 1.5 10*3/uL (ref 0.7–4.0)
LYMPHS PCT: 20 %
MCH: 30.6 pg (ref 26.0–34.0)
MCHC: 29.8 g/dL — ABNORMAL LOW (ref 30.0–36.0)
MCV: 102.9 fL — ABNORMAL HIGH (ref 80.0–100.0)
MONOS PCT: 11 %
Monocytes Absolute: 0.8 10*3/uL (ref 0.1–1.0)
NEUTROS ABS: 4.8 10*3/uL (ref 1.7–7.7)
NEUTROS PCT: 66 %
PLATELETS: 288 10*3/uL (ref 150–400)
RBC: 3.46 MIL/uL — ABNORMAL LOW (ref 3.87–5.11)
RDW: 18.1 % — AB (ref 11.5–15.5)
WBC: 7.4 10*3/uL (ref 4.0–10.5)
nRBC: 0 % (ref 0.0–0.2)

## 2018-04-04 LAB — IRON AND TIBC
IRON: 34 ug/dL — AB (ref 41–142)
Saturation Ratios: 9 % — ABNORMAL LOW (ref 21–57)
TIBC: 374 ug/dL (ref 236–444)
UIBC: 340 ug/dL (ref 120–384)

## 2018-04-04 LAB — FERRITIN: FERRITIN: 38 ng/mL (ref 11–307)

## 2018-04-04 MED ORDER — FAMOTIDINE 20 MG PO TABS
20.0000 mg | ORAL_TABLET | Freq: Once | ORAL | Status: AC
  Administered 2018-04-04: 20 mg via ORAL

## 2018-04-04 MED ORDER — FAMOTIDINE 20 MG PO TABS
ORAL_TABLET | ORAL | Status: AC
  Filled 2018-04-04: qty 1

## 2018-04-04 MED ORDER — DIPHENHYDRAMINE HCL 50 MG/ML IJ SOLN
50.0000 mg | Freq: Once | INTRAMUSCULAR | Status: AC
  Administered 2018-04-04: 50 mg via INTRAVENOUS

## 2018-04-04 MED ORDER — FAMOTIDINE IN NACL 20-0.9 MG/50ML-% IV SOLN: 20.0000 mg | Freq: Once | INTRAVENOUS | Status: DC

## 2018-04-04 MED ORDER — SODIUM CHLORIDE 0.9 % IV SOLN
510.0000 mg | Freq: Once | INTRAVENOUS | Status: AC
  Administered 2018-04-04: 510 mg via INTRAVENOUS
  Filled 2018-04-04: qty 17

## 2018-04-04 MED ORDER — DIPHENHYDRAMINE HCL 50 MG/ML IJ SOLN
INTRAMUSCULAR | Status: AC
  Filled 2018-04-04: qty 1

## 2018-04-04 NOTE — Patient Instructions (Signed)

## 2018-04-04 NOTE — Progress Notes (Signed)
Pt declined to stay for 30 minute post observation period after feraheme infusion

## 2018-04-04 NOTE — Progress Notes (Signed)
[facility] HEMATOLOGY OFFICE PROGRESS NOTE  Date of Service:  04/04/2018   [name], MD [place] [address]  DIAGNOSIS: Iron deficiency anemia due to chronic blood loss  History of hepatitis C.   PROBLEM LIST:  1. Recurrent iron-deficiency anemia secondary to gastrointestinal bleeding (as noted above). Ms. Lukach also has required red cell transfusions in the past, most recently early December 2012. She underwent a small bowel enteroscopy by Dr. Dan Humphreys at [facility] on 08/13/2011. The enteroscope was passed up to 6 feet into the jejunum. He saw multiple AVMs, which were ablated. Two of the large AVMs bled during APC. These were in the 4th part of the duodenum very close to the ligament of Treitz. The remainder of the AVMs were in the proximal jejunum. In addition to the small bowel AV malformations, which were treated with APC, he saw some small esophageal varices which were not bleeding and also evidence of mild portal hypertensive gastropathy.  2. History of duodenal arteriovenous malformation.  3. History of hepatitis C infection.  4. Gastroesophageal reflux disease.  5. Depression.  6. Hypertension.  7. History of right Bell's palsy.  8. Status post left above-knee amputation secondary to necrotizing fasciitis 03/06/2009.  9. Neurodermatitis.  10. Previous history of alcohol usage.  11. Admission to the hospital from 12/20/2011 through 12/25/2011 for hematochezia felt to be secondary to a diverticular bleed, requiring 2 units of packed red cells.  12. Diverticulosis.  13. Elevated alpha fetoprotein, 72.3 on 11/19/2011 and 76.0 on 01/11/2012.  14. Abnormal MRI of the liver from 11/28/2011 showing diffuse and markedly low signal intensity throughout the liver as well as diffuse low signal intensity in the adrenal glands. 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 There  were no findings for cirrhosis, portal hypertension, splenomegaly or ascites, not were there any worrisome enhancing liver lesions.  15. Systolic ejection murmur.   CHIEF COMPLAIN: Follow up anemia  PRIOR TREATMENT: The patient has been heavily dependent upon IV Feraheme. Over the past year or so she has been receiving about 1 Feraheme infusion monthly on average. The patient's most recent infusions of IV Feraheme 510 mg occurred on the following dates: 06/05/2011, 07/03/2011, 07/31/2011, 08/17/2011, 08/28/2011, 10/02/2011 01/30/2012, 06/06/2012 and 09/10/2012, 05/2013, 07/2013, 08/2013, 09/2013, 11/12/2013, [01/15/2014 and 03/16/2014 at 10230mg ], 09/16/2014 and 09/23/2014, 11/2014 , 02/24/2015,  04/07/2015, 08/03/2015, 10/03/2015, 12/16/2015, 01/2016 and 02/27/2016, 09/07/16, 01/31/17, 07/15/17, 08/22/17  CURRENT TREATMENT: Feraheme prn   INTERVAL HISTORY:   [name] 67 y.o. female with history of recurrent iron-deficiency anemia due to GI bleeding is here for follow-up. She had a colonoscopy by Dr. Loletha Carrow on 03/17/18 which was benign.  She presents to the clinic today in a wheelchair. She notes she is doing fine. She notes she was told by Dr. Loletha Carrow that based on her exams with no bleeding found, she will have to continue IV iron as needed. She notes seeing dark black stool which is loose and will occur for 2 days and then stop. She thinks her Glucerna allows her bowel to be loose. She notes she also takes oral iron which she did not know can also cause black stool. She is working to manage to DM.  She notes pain in her right knee which is occasional. She uses her boot when she is in greater pain. She is able to ambulate and be active at home. She uses her wheelchair when visiting  the doctor.      MEDICAL HISTORY: Past Medical History:  Diagnosis Date  . Allergic rhinitis   . Anemia, iron deficiency 05/03/2011   2/2 GI AVMs - recieves iron infusions as well as periodic red blood cell transfusions  .  Anxiety   . Arteriovenous malformation of duodenum 02/25/2015  . CARPAL TUNNEL RELEASE, HX OF 07/22/2008  . Colon polyps   . Diabetes mellitus without complication ([place])    type 2  . Difficulty sleeping    takes trazadone for sleep  . Diverticulosis   . GERD (gastroesophageal reflux disease)   . Hepatitis C    in SVR with Harvoni 12/2014-02/2015.   Marland Kitchen History of hernia repair 08/18/2012  . History of porphyria 11/30/2014   Porphyria cutanea tarda in setting of chronic Hepatitis C, frequent  IV iron infusions, and alcohol abuse   . Hypertension   . Peripheral vascular disease ([place])   . Stroke [facility]) 2010   no residual deficits  . Substance use disorder    cocaine     ALLERGIES:  is allergic to ciprofloxacin; morphine and related; penicillins; codeine; lisinopril; and feraheme [ferumoxytol].  MEDICATIONS: has a current medication list which includes the following prescription(s): aspirin ec, atenolol, cetirizine, cholecalciferol, colchicine, fluoxetine, gabapentin, hydrochlorothiazide, losartan, omeprazole, potassium chloride, trazodone, vitamin c, ferrous sulfate, and tramadol.  SURGICAL HISTORY:  Past Surgical History:  Procedure Laterality Date  . ABDOMINAL HYSTERECTOMY    . APPLICATION OF WOUND VAC N/A 06/21/2014   Procedure: APPLICATION OF WOUND VAC;  Surgeon: Coralie Keens, MD;  Location: [facility];  Service: General;  Laterality: N/A;  . CARPAL TUNNEL RELEASE     rt hand  . COLONOSCOPY WITH PROPOFOL N/A 03/17/2018   Procedure: COLONOSCOPY WITH PROPOFOL;  Surgeon: Doran Stabler, MD;  Location: [facility];  Service: Gastroenterology;  Laterality: N/A;  . ENTEROSCOPY N/A 12/04/2012   Procedure: ENTEROSCOPY;  Surgeon: Beryle Beams, MD;  Location: [facility];  Service: Endoscopy;  Laterality: N/A;  . ENTEROSCOPY N/A 12/18/2012   Procedure: ENTEROSCOPY;  Surgeon: Beryle Beams, MD;  [facility];  Service: Endoscopy;  Laterality: N/A;  . ENTEROSCOPY N/A 02/25/2015     Procedure: ENTEROSCOPY;  Surgeon: Inda Castle, MD;  Location: [facility] ENDOSCOPY;  Service: Endoscopy;  Laterality: N/A;  . ENTEROSCOPY N/A 10/29/2017   Procedure: ENTEROSCOPY;  Surgeon: Doran Stabler, MD;  [facility];  Service: Gastroenterology;  Laterality: N/A;  . ESOPHAGOGASTRODUODENOSCOPY  05/05/2011   Procedure: ESOPHAGOGASTRODUODENOSCOPY (EGD);  Surgeon: Zenovia Jarred, MD;  Location: [facility];  Service: Gastroenterology;  Laterality: N/A;  Dr. Hilarie Fredrickson will do procedure for Dr. Benson Norway Saturday.  . ESOPHAGOGASTRODUODENOSCOPY  05/08/2011   Procedure: ESOPHAGOGASTRODUODENOSCOPY (EGD);  Surgeon: Beryle Beams;  [facility];  Service: Endoscopy;  Laterality: N/A;  . ESOPHAGOGASTRODUODENOSCOPY  06/07/2011   Procedure: ESOPHAGOGASTRODUODENOSCOPY (EGD);  Surgeon: Beryle Beams, MD;  Location: [facility];  Service: Endoscopy;  Laterality: N/A;  . ESOPHAGOGASTRODUODENOSCOPY  12/20/2011   Procedure: ESOPHAGOGASTRODUODENOSCOPY (EGD);  Surgeon: Beryle Beams, MD;  Location: [facility];  Service: Endoscopy;  Laterality: N/A;  . EYE SURGERY Bilateral    cataracts  . FLEXIBLE SIGMOIDOSCOPY  12/21/2011   Procedure: FLEXIBLE SIGMOIDOSCOPY;  Surgeon: Beryle Beams, MD;  [facility];  Service: Endoscopy;  Laterality: N/A;  . HOT HEMOSTASIS  06/07/2011   Procedure: HOT HEMOSTASIS (ARGON PLASMA COAGULATION/BICAP);  Surgeon: Beryle Beams, MD;  Location: [facility];  Service: Endoscopy;  Laterality: N/A;  .  INCISIONAL HERNIA REPAIR N/A 06/01/2014   Procedure: ATTEMPTED LAPAROSCOPIC AND OPEN INCISIONAL HERNIA REPAIR WITH MESH;  Surgeon: Coralie Keens, MD;  Location: [facility];  Service: General;  Laterality: N/A;  . INSERTION OF MESH N/A 06/01/2014   Procedure: INSERTION OF MESH;  Surgeon: Coralie Keens, MD;  Location: [facility];  Service: General;  Laterality: N/A;  . LAPAROTOMY  02/16/2012   Procedure: EXPLORATORY LAPAROTOMY;  Surgeon: Edward Jolly, MD;  [facility];  Service: General;  Laterality: N/A;  oversewing of anastomotic leak and rigid sigmoidoscopy  . LAPAROTOMY N/A 06/21/2014   Procedure: ABDOMINAL WOUND EXPLORATION;  Surgeon: Coralie Keens, MD;  Location: [facility];  Service: General;  Laterality: N/A;  . LEG AMPUTATION ABOVE KNEE     left  . PARTIAL COLECTOMY  02/15/2012   Procedure: PARTIAL COLECTOMY;  Surgeon: Harl Bowie, MD;  [facility];  Service: General;  Laterality: N/A;  . TONSILLECTOMY     Allergies as of 04/04/2018      Reactions   Ciprofloxacin Hives, Swelling   Morphine And Related Other (See Comments)   Makes pt sad and paranoid   Penicillins Hives, Swelling   Has patient had a PCN reaction causing immediate rash, facial/tongue/throat swelling, SOB or lightheadedness with hypotension:YES Has patient had a PCN reaction causing severe rash involving mucus membranes or skin necrosis:UNSURE Has patient had a PCN reaction that required hospitalization:YES Has patient had a PCN reaction occurring within the last 10 years:No If all of the above answers are "NO", then may proceed with Cephalosporin use.   Codeine Hives, Swelling   Lisinopril Cough   New onset dry cough after starting lisinopril. Resolved upon switching to losartan.   Feraheme [ferumoxytol] Itching   Tolerated Feraheme 6/26 & 7/21 pre-medications prior to medication      Medication List        Accurate as of 04/04/18  2:31 PM. Always use your most recent med list.          ACCU-CHEK FASTCLIX LANCETS Misc Use to test blood glucose 3 times daily.   ACCU-CHEK GUIDE test strip Generic drug:  glucose blood CHECK BLOOD SUGAR 3 TIMES A DAY   allopurinol 300 MG tablet Commonly known as:  ZYLOPRIM TAKE 1 TABLET BY MOUTH ONCE DAILY   aspirin EC 81 MG tablet Take 1 tablet (81 mg total) by mouth daily.   cetirizine 10 MG tablet Commonly known as:  ZYRTEC TAKE 1 TABLET BY MOUTH DAILY   cholecalciferol 25 MCG (1000 UT) tablet Commonly known  as:  VITAMIN D Take 1 tablet (1,000 Units total) by mouth daily.   colchicine 0.6 MG tablet TAKE ONE TABLET BY MOUTH DAILY   DILT-XR 180 MG 24 hr capsule Generic drug:  diltiazem TAKE 1 CAPSULE BY MOUTH ONCE DAILY   diphenhydramine-acetaminophen 25-500 MG Tabs tablet Commonly known as:  TYLENOL PM Take 1 tablet by mouth at bedtime as needed (sleep).   feeding supplement (GLUCERNA SHAKE) Liqd Take 237 mLs by mouth 3 (three) times daily between meals.   ferrous sulfate 325 (65 FE) MG EC tablet Take 1 tablet (325 mg total) by mouth daily after breakfast.   FLUoxetine 20 MG capsule Commonly known as:  PROZAC TAKE 3 CAPSULES BY MOUTH DAILY   gabapentin 400 MG capsule Commonly known as:  NEURONTIN TAKE 2 CAPSULES BY MOUTH THREE TIMES A DAY   Insulin Glargine 100 UNIT/ML Solostar Pen Commonly known as:  LANTUS Inject 10 Units into the skin daily  at 10 pm.   Insulin Pen Needle 32G X 4 MM Misc Use to inject insulin once a day. The patient is insulin requiring, ICD 10 code E11.9. The patient injects 1 times per day.   losartan 100 MG tablet Commonly known as:  COZAAR TAKE 1 TABLET BY MOUTH EVERY DAY   metFORMIN 1000 MG tablet Commonly known as:  GLUCOPHAGE TAKE 1 TABLET (1,000 MG TOTAL) BY MOUTH TWICE A DAY WITH A MEAL.   omeprazole 20 MG capsule Commonly known as:  PRILOSEC TAKE 1 CAPSULE BY MOUTH EVERY DAY   ondansetron 4 MG tablet Commonly known as:  ZOFRAN Take 1 tablet (4 mg total) by mouth every 8 (eight) hours as needed for nausea or vomiting.   PEG-KCl-NaCl-NaSulf-Na Asc-C 140 g Solr Take 140 g by mouth as directed.   PEG-KCl-NaCl-NaSulf-Na Asc-C 140 g Solr Take 140 g by mouth as directed.   potassium chloride SA 20 MEQ tablet Commonly known as:  K-DUR,KLOR-CON Take 20 mEq by mouth daily.   sodium polystyrene powder Commonly known as:  KAYEXALATE Take 30g once today.   tolnaftate 1 % cream Commonly known as:  TINACTIN Apply 1 application topically 2  (two) times daily.   traZODone 50 MG tablet Commonly known as:  DESYREL TAKE ONE TABLET BY MOUTH AT BEDTIME   vitamin C 500 MG tablet Commonly known as:  ASCORBIC ACID Take 500 mg by mouth every morning.      REVIEW OF SYSTEMS:   Constitutional: Denies fevers, chills or abnormal weight loss;  Eyes: Denies blurriness of vision Ears, nose, mouth, throat, and face: Denies mucositis or sore throat Respiratory: Denies cough, dyspnea or wheezes Cardiovascular: Denies palpitation, chest discomfort   Gastrointestinal:  Denies nausea, heartburn or change in bowel habits (+) dark stools Skin: Negative MSK: (+) Intermittent pain in right knee Lymphatics: Denies new lymphadenopathy or easy bruising Neurological:Denies numbness, tingling or new weaknesses Behavioral/Psych: Mood is stable, no new changes  All other systems were reviewed with the patient and are negative.  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  Head: Normocephalic, without obvious abnormality, atraumatic  Neck: no adenopathy, supple, symmetrical, trachea midline and thyroid not enlarged, symmetric, no tenderness/mass/nodules  Lymph nodes: Cervical adenopathy: None appreciated  Heart:regular rate and rhythm, S1, S2 normal and systolic murmur 2/6 Lung:chest clear, no wheezing, rales, normal symmetric air entry, Heart exam - S1, S2 normal, no murmur, no gallop, rate regular  Abdomen: soft, distended, normal bowel sounds, nontender EXT: No peripheral edema on right extremity; Left above the knee amputation. She is  wearing a prothesis. (+) right leg and foot in a brace  Labs:  CBC Latest Ref Rng & Units 04/04/2018 03/17/2018 03/05/2018  WBC 4.0 - 10.5 K/uL 7.4 - 6.1  Hemoglobin 12.0 - 15.0 g/dL 10.6(L) 11.6(L) 9.1(L)  Hematocrit 36.0 - 46.0 % 35.6(L) 34.0(L) 30.8(L)  Platelets 150 - 400 K/uL 288 - 312    CMP Latest Ref Rng & Units 03/17/2018 01/29/2018 01/22/2018  Glucose 70 - 99  mg/dL 137(H) 95 109(H)  BUN 8 - 23 mg/dL - 30(H) 21  Creatinine 0.44 - 1.00 mg/dL - 1.33(H) 1.15(H)  Sodium 135 - 145 mmol/L 146(H) 138 141  Potassium 3.5 - 5.1 mmol/L 4.4 6.6(HH) 4.8  Chloride 98 - 111 mmol/L - 107 110  CO2 22 - 32 mmol/L - 22 22  Calcium 8.9 - 10.3 mg/dL - 9.5 9.4  Total Protein 6.5 - 8.1 g/dL - - 7.2  Total Bilirubin 0.3 - 1.2 mg/dL - - <0.2(L)  Alkaline Phos 38 - 126 U/L - - 84  AST 15 - 41 U/L - - 45(H)  ALT 0 - 44 U/L - - 43   Results for MILISSA, FESPERMAN (MRN [id number]) as of 04/04/2018 13:01  Ref. Range 01/02/2018 14:13 02/05/2018 13:25 03/05/2018 13:13  Iron Latest Ref Range: 41 - 142 ug/dL 21 (L) 19 (L) 376 (H)  UIBC Latest Units: ug/dL 464 384 14  TIBC Latest Ref Range: 236 - 444 ug/dL 485 (H) 403 390  Saturation Ratios Latest Ref Range: 21 - 57 % 4 (L) 5 (L) 96 (H)  Ferritin Latest Ref Range: 11 - 307 ng/mL 8 (L) 21 120    Procedure 10/29/2017 Small Bowel Endoscopy Findings: The examined esophagus was normal. A medium amount of food (residue) was found in the gastric body. The examined duodenum was normal. There was no evidence of significant pathology in the entire examined portion of jejunum. Impression: - Normal esophagus. - A medium amount of food (residue) in the stomach. - Normal examined duodenum. - The examined portion of the jejunum was normal. - No specimens collected. No SB AVMs seen.   RADIOGRAPHIC STUDIES:  Colonoscopy by Dr. Loletha Carrow 03/17/18  IMPRESSION - Decreased sphincter tone found on digital rectal exam. - Patent end-to-side colo-colonic anastomosis, characterized  by healthy appearing mucosa. - The examination was otherwise normal on direct and retroflexion views. - No specimen collected.    Mammogram Digital Screening Bilateral 09/21/15 IMPRESSION: No mammographic evidence of malignancy. A result letter of this screening mammogram will be mailed directly to the patient.  ASSESSMENT:  [name] 67 y.o. female with a history of   1. Iron deficiency anemia secondary to GI loss, with aspects of Anemia of Chronic Disease  -History of multiple GI bleeding in 2016, required blood transfusion, hospitalization and IV Feraheme -She previously required blood transfusion periodically, much less lately  -She has been receiving IV Feraheme every one month on average lately -Due to her PCT, we'll be cautious with IV iron. Her PCT has resolved due to her successfully treated hepatitis C, I'll be okay to be more liberal to replace her iron deficiency. -She had a moderate response to IV iron recently.   -She has been on oral iron for 5-6 years now and has had black watery stool.  -She had an EGD and colonoscopy on 10/29/2017 and 03/17/2018 with Dr. Loletha Carrow, which was normal.  -she previously had pushed endoscopy in [facility] and was found to have AVM in small bowel. I discussed if she experience more GI bleeding, I will refer her back to GI at [facility] revewied, Ferritin at 38, Iron at 34 and Sat at 9, Hg at 10.6.  We will give IV iron today and next week -Continue to check her labs in 2 weeks, and will set up iv feraheme with goal ferritin >100. -F/u in  8 weeks    2. Porphyria cutaneous tarda (PCT), resolved -per pt, she had significant sun  sensitivity which caused skin blisters on her arms and legs, she has multiple skin pigmentations from healed skin rash on her arms. -Both her urine and serum porphyria, especially uroporphyrin and coproporphyrin all elevated.  -This is likely secondary to her hepatitis C -She has previously completed treatment  for hepatitis C. Her PCT has resolved now   3. chronic hepatitis C infection -She previously tested positive for hepatitis C in 2009. She has previously completed treatment for hepatitis C in 2016 and repeated test was negative.  -liver function has previously improved -She was previously seen by GI Dr. Deatra Ina who has since left the practice. She was previously given GI [place] clinic's information to be seen.  -She is due for Liver US, and AFP for liver cancer screening. I previously contacted Dr. Jari Favre about this.   4. DM2 --Management per PCP's office.  -Glucose was previously 341. I previously encouraged her to restart her metformin.  -Was hospitalized in March 2018 for high glucose levels greater than 1000.  -Continue to follow up closely with PCP -She monitors her BG at home   5. Left BKA  -She has a new prosthesis, she is wheelchair bound most of time, I previously encouraged her to continue physical therapy  6. Smoking cessation  -She is still actively smoking, not ready to quit   7. Right knee pain and leg swelling  -likely secondary to osteoarthritis. She will follow up with PCP  -I previously encouraged her to use compression stock for edema, she will think about it  -Pain has improved and now intermittent, manageable.   Plan  -Iv feraheme today and next week  -Lab every 2 weeksX4 -Lab in 8 weeks    All questions were answered. The patient knows to call the clinic with any problems, questions or concerns. We can certainly see the patient much sooner if necessary.  I spent 20 minutes counseling the patient face to face. The total time spent in the appointment was 25 minutes.  Oneal Deputy, am acting as scribe for Truitt Merle, MD.   I have reviewed the above documentation for accuracy and completeness, and I agree with the above.    Truitt Merle 04/04/2018

## 2018-04-05 ENCOUNTER — Encounter: Payer: Self-pay | Admitting: Hematology

## 2018-04-07 ENCOUNTER — Telehealth: Payer: Self-pay | Admitting: Hematology

## 2018-04-07 NOTE — Telephone Encounter (Signed)
Verified appointment with patient.  Mailed calendar per patient request.

## 2018-04-16 DIAGNOSIS — G933 Postviral fatigue syndrome: Secondary | ICD-10-CM | POA: Diagnosis not present

## 2018-04-17 ENCOUNTER — Other Ambulatory Visit: Payer: Self-pay | Admitting: Internal Medicine

## 2018-04-18 ENCOUNTER — Telehealth: Payer: Self-pay | Admitting: Hematology

## 2018-04-18 ENCOUNTER — Ambulatory Visit: Payer: Medicare Other

## 2018-04-18 ENCOUNTER — Other Ambulatory Visit: Payer: Medicare Other

## 2018-04-18 ENCOUNTER — Inpatient Hospital Stay: Payer: Medicare Other

## 2018-04-18 ENCOUNTER — Other Ambulatory Visit: Payer: Self-pay | Admitting: Internal Medicine

## 2018-04-18 VITALS — BP 130/83 | HR 96 | Temp 99.3°F | Resp 18

## 2018-04-18 DIAGNOSIS — R011 Cardiac murmur, unspecified: Secondary | ICD-10-CM | POA: Diagnosis not present

## 2018-04-18 DIAGNOSIS — Z89512 Acquired absence of left leg below knee: Secondary | ICD-10-CM | POA: Diagnosis not present

## 2018-04-18 DIAGNOSIS — Z8673 Personal history of transient ischemic attack (TIA), and cerebral infarction without residual deficits: Secondary | ICD-10-CM | POA: Diagnosis not present

## 2018-04-18 DIAGNOSIS — Z8619 Personal history of other infectious and parasitic diseases: Secondary | ICD-10-CM

## 2018-04-18 DIAGNOSIS — Z794 Long term (current) use of insulin: Principal | ICD-10-CM

## 2018-04-18 DIAGNOSIS — D500 Iron deficiency anemia secondary to blood loss (chronic): Secondary | ICD-10-CM

## 2018-04-18 DIAGNOSIS — E1165 Type 2 diabetes mellitus with hyperglycemia: Secondary | ICD-10-CM | POA: Diagnosis not present

## 2018-04-18 DIAGNOSIS — I10 Essential (primary) hypertension: Secondary | ICD-10-CM | POA: Diagnosis not present

## 2018-04-18 DIAGNOSIS — Z7982 Long term (current) use of aspirin: Secondary | ICD-10-CM | POA: Diagnosis not present

## 2018-04-18 DIAGNOSIS — I493 Ventricular premature depolarization: Secondary | ICD-10-CM | POA: Diagnosis not present

## 2018-04-18 DIAGNOSIS — K219 Gastro-esophageal reflux disease without esophagitis: Secondary | ICD-10-CM | POA: Diagnosis not present

## 2018-04-18 DIAGNOSIS — IMO0001 Reserved for inherently not codable concepts without codable children: Secondary | ICD-10-CM

## 2018-04-18 DIAGNOSIS — M7989 Other specified soft tissue disorders: Secondary | ICD-10-CM | POA: Diagnosis not present

## 2018-04-18 DIAGNOSIS — K922 Gastrointestinal hemorrhage, unspecified: Secondary | ICD-10-CM

## 2018-04-18 DIAGNOSIS — M25561 Pain in right knee: Secondary | ICD-10-CM | POA: Diagnosis not present

## 2018-04-18 DIAGNOSIS — I8500 Esophageal varices without bleeding: Secondary | ICD-10-CM | POA: Diagnosis not present

## 2018-04-18 DIAGNOSIS — Z79899 Other long term (current) drug therapy: Secondary | ICD-10-CM | POA: Diagnosis not present

## 2018-04-18 LAB — CBC WITH DIFFERENTIAL/PLATELET
ABS IMMATURE GRANULOCYTES: 0.07 10*3/uL (ref 0.00–0.07)
BASOS ABS: 0 10*3/uL (ref 0.0–0.1)
Basophils Relative: 0 %
Eosinophils Absolute: 0.2 10*3/uL (ref 0.0–0.5)
Eosinophils Relative: 2 %
HEMATOCRIT: 34.9 % — AB (ref 36.0–46.0)
Hemoglobin: 10.9 g/dL — ABNORMAL LOW (ref 12.0–15.0)
IMMATURE GRANULOCYTES: 1 %
LYMPHS ABS: 1.3 10*3/uL (ref 0.7–4.0)
Lymphocytes Relative: 16 %
MCH: 31.7 pg (ref 26.0–34.0)
MCHC: 31.2 g/dL (ref 30.0–36.0)
MCV: 101.5 fL — AB (ref 80.0–100.0)
MONOS PCT: 11 %
Monocytes Absolute: 0.8 10*3/uL (ref 0.1–1.0)
NEUTROS ABS: 5.6 10*3/uL (ref 1.7–7.7)
NEUTROS PCT: 70 %
PLATELETS: 244 10*3/uL (ref 150–400)
RBC: 3.44 MIL/uL — ABNORMAL LOW (ref 3.87–5.11)
RDW: 17.2 % — ABNORMAL HIGH (ref 11.5–15.5)
WBC: 8 10*3/uL (ref 4.0–10.5)
nRBC: 0 % (ref 0.0–0.2)

## 2018-04-18 MED ORDER — DIPHENHYDRAMINE HCL 50 MG/ML IJ SOLN
INTRAMUSCULAR | Status: AC
  Filled 2018-04-18: qty 1

## 2018-04-18 MED ORDER — SODIUM CHLORIDE 0.9 % IV SOLN
510.0000 mg | Freq: Once | INTRAVENOUS | Status: AC
  Administered 2018-04-18: 510 mg via INTRAVENOUS
  Filled 2018-04-18: qty 17

## 2018-04-18 MED ORDER — DIPHENHYDRAMINE HCL 50 MG/ML IJ SOLN
50.0000 mg | Freq: Once | INTRAMUSCULAR | Status: AC
  Administered 2018-04-18: 50 mg via INTRAVENOUS

## 2018-04-18 MED ORDER — SODIUM CHLORIDE 0.9 % IV SOLN
INTRAVENOUS | Status: DC
  Administered 2018-04-18: 15:00:00 via INTRAVENOUS
  Filled 2018-04-18: qty 250

## 2018-04-18 MED ORDER — FAMOTIDINE IN NACL 20-0.9 MG/50ML-% IV SOLN
20.0000 mg | Freq: Once | INTRAVENOUS | Status: AC
  Administered 2018-04-18: 20 mg via INTRAVENOUS

## 2018-04-18 NOTE — Telephone Encounter (Signed)
Tried to reach regarding 12/6 add on

## 2018-04-18 NOTE — Progress Notes (Signed)
Patient presents today for Feraheme, infusion completed. Patient refused 30 minutes post observation. Education provided.

## 2018-04-18 NOTE — Patient Instructions (Signed)

## 2018-04-19 LAB — AFP TUMOR MARKER: AFP, SERUM, TUMOR MARKER: 3.2 ng/mL (ref 0.0–8.3)

## 2018-04-19 NOTE — Telephone Encounter (Signed)
Prescription discontinued; she has had f/u iStat labs last month, presumably off of potassium supplementation with appropriate potassium level.  [name], MD IMTS - PGY3

## 2018-04-21 NOTE — Telephone Encounter (Signed)
Next appt scheduled 12/4 with PCP. 

## 2018-04-25 ENCOUNTER — Ambulatory Visit: Payer: Medicare Other

## 2018-04-25 ENCOUNTER — Telehealth: Payer: Self-pay

## 2018-04-25 NOTE — Telephone Encounter (Signed)
Spoke with patient regarding lab results, per Dr. Burr Medico we will continue to monitor these closley, no concerns for now, patient verbalized an understanding.

## 2018-04-25 NOTE — Telephone Encounter (Signed)
-----   Message from Truitt Merle, MD sent at 04/20/2018  5:35 PM EST ----- Please let pt know the lab results, continue close monitoring, no concerns for now, thanks   Truitt Merle  04/20/2018

## 2018-04-30 ENCOUNTER — Encounter: Payer: Medicare Other | Admitting: Internal Medicine

## 2018-05-01 MED FILL — LANTUS SOLOSTAR 100 UNITS/M: 100 | 84 days supply | Qty: 9 | Fill #0

## 2018-05-02 ENCOUNTER — Inpatient Hospital Stay: Payer: Medicare Other

## 2018-05-02 ENCOUNTER — Inpatient Hospital Stay: Payer: Medicare Other | Attending: Nurse Practitioner

## 2018-05-02 VITALS — BP 136/78 | HR 96 | Temp 98.6°F | Resp 18

## 2018-05-02 DIAGNOSIS — K922 Gastrointestinal hemorrhage, unspecified: Secondary | ICD-10-CM | POA: Insufficient documentation

## 2018-05-02 DIAGNOSIS — F17210 Nicotine dependence, cigarettes, uncomplicated: Secondary | ICD-10-CM | POA: Diagnosis not present

## 2018-05-02 DIAGNOSIS — D500 Iron deficiency anemia secondary to blood loss (chronic): Secondary | ICD-10-CM

## 2018-05-02 LAB — CBC WITH DIFFERENTIAL/PLATELET
Abs Immature Granulocytes: 0.12 10*3/uL — ABNORMAL HIGH (ref 0.00–0.07)
BASOS ABS: 0 10*3/uL (ref 0.0–0.1)
Basophils Relative: 0 %
EOS PCT: 1 %
Eosinophils Absolute: 0.2 10*3/uL (ref 0.0–0.5)
HEMATOCRIT: 32.7 % — AB (ref 36.0–46.0)
HEMOGLOBIN: 10.5 g/dL — AB (ref 12.0–15.0)
Immature Granulocytes: 1 %
Lymphocytes Relative: 12 %
Lymphs Abs: 1.4 10*3/uL (ref 0.7–4.0)
MCH: 32 pg (ref 26.0–34.0)
MCHC: 32.1 g/dL (ref 30.0–36.0)
MCV: 99.7 fL (ref 80.0–100.0)
Monocytes Absolute: 1.1 10*3/uL — ABNORMAL HIGH (ref 0.1–1.0)
Monocytes Relative: 10 %
NRBC: 0 % (ref 0.0–0.2)
Neutro Abs: 8.7 10*3/uL — ABNORMAL HIGH (ref 1.7–7.7)
Neutrophils Relative %: 76 %
Platelets: 353 10*3/uL (ref 150–400)
RBC: 3.28 MIL/uL — ABNORMAL LOW (ref 3.87–5.11)
RDW: 15.9 % — ABNORMAL HIGH (ref 11.5–15.5)
WBC: 11.5 10*3/uL — ABNORMAL HIGH (ref 4.0–10.5)

## 2018-05-02 LAB — IRON AND TIBC
IRON: 88 ug/dL (ref 41–142)
Saturation Ratios: 30 % (ref 21–57)
TIBC: 294 ug/dL (ref 236–444)
UIBC: 206 ug/dL (ref 120–384)

## 2018-05-02 LAB — FERRITIN: Ferritin: 212 ng/mL (ref 11–307)

## 2018-05-02 MED ORDER — DIPHENHYDRAMINE HCL 50 MG/ML IJ SOLN
INTRAMUSCULAR | Status: AC
  Filled 2018-05-02: qty 1

## 2018-05-02 MED ORDER — FAMOTIDINE 20 MG PO TABS
20.0000 mg | ORAL_TABLET | Freq: Once | ORAL | Status: AC
  Administered 2018-05-02: 20 mg via ORAL

## 2018-05-02 MED ORDER — SODIUM CHLORIDE 0.9 % IV SOLN
510.0000 mg | Freq: Once | INTRAVENOUS | Status: AC
  Administered 2018-05-02: 510 mg via INTRAVENOUS
  Filled 2018-05-02: qty 17

## 2018-05-02 MED ORDER — DIPHENHYDRAMINE HCL 25 MG PO CAPS
ORAL_CAPSULE | ORAL | Status: AC
  Filled 2018-05-02: qty 2

## 2018-05-02 MED ORDER — DIPHENHYDRAMINE HCL 50 MG/ML IJ SOLN
50.0000 mg | Freq: Once | INTRAMUSCULAR | Status: AC
  Administered 2018-05-02: 50 mg via INTRAVENOUS

## 2018-05-02 MED ORDER — FAMOTIDINE 20 MG PO TABS
ORAL_TABLET | ORAL | Status: AC
  Filled 2018-05-02: qty 1

## 2018-05-02 MED ORDER — SODIUM CHLORIDE 0.9 % IV SOLN
INTRAVENOUS | Status: DC
  Administered 2018-05-02: 14:00:00 via INTRAVENOUS
  Filled 2018-05-02: qty 250

## 2018-05-02 NOTE — Patient Instructions (Signed)

## 2018-05-05 ENCOUNTER — Telehealth: Payer: Self-pay | Admitting: Hematology

## 2018-05-05 ENCOUNTER — Telehealth: Payer: Self-pay

## 2018-05-05 NOTE — Telephone Encounter (Signed)
-----   Message from Truitt Merle, MD sent at 05/03/2018 11:15 PM EST ----- Please let pt know her iron level is good, anemia stable, I will set up her f/u on 12/20, thanks  Truitt Merle  05/03/2018

## 2018-05-05 NOTE — Telephone Encounter (Signed)
Scheduled appt per 12/7 sch message pt is aware of apt date and time

## 2018-05-05 NOTE — Telephone Encounter (Signed)
Spoke with patient concerning latest lab results, per Dr. Burr Medico iron level is good, anemia is stable, will repeat on 12/20 at 1:00 and see Dr. Burr Medico at 1:30, patient verbalized an understanding.

## 2018-05-07 ENCOUNTER — Telehealth: Payer: Self-pay | Admitting: Hematology

## 2018-05-07 NOTE — Telephone Encounter (Signed)
Per 12/11 sch message - called patient at both number - no answer - only able to leave message on mobile number,.

## 2018-05-13 DIAGNOSIS — G933 Postviral fatigue syndrome: Secondary | ICD-10-CM | POA: Diagnosis not present

## 2018-05-15 ENCOUNTER — Other Ambulatory Visit: Payer: Self-pay | Admitting: Internal Medicine

## 2018-05-16 ENCOUNTER — Other Ambulatory Visit: Payer: Medicare Other

## 2018-05-16 ENCOUNTER — Ambulatory Visit: Payer: Medicare Other | Admitting: Hematology

## 2018-05-18 NOTE — Progress Notes (Signed)
[name]   [phone] Fax:(336) [phone]   Clinic Follow 3up Note   Patient Care Team: [name], MD as PCP - Evalina Field, MD as Consulting Physician (Ophthalmology) Coralie Keens, MD as Consulting Physician (General Surgery) Truitt Merle, MD as Consulting Physician (Hematology) Comer, Okey Regal, MD as Consulting Physician (Infectious Diseases) 05/19/2018  CHIEF COMPLAINT: Follow up Anemia  CURRENT THERAPY: Feraheme PRN  INTERVAL HISTORY: [name] 67 y.o. female with history of recurrent iron-deficiency anemia due to GI bleeding is here for follow-up. She has been doing well. She takes her iron pill every other day at this time. She takes oral iron without any issues with constipation. She notes that she used to have issues with constipation. She has mild rash to her right shoulder. Patient hasn't seen Dr. Linus Salmons since 2017 regarding her hepatitis C, however, she wasn't given a follow up appointment. She has tried to quit smoking and went to a cheaper pack of cigarettes that are lasting longer than her previous brand.   On review of systems, she reports mild rash to right shoulder. She denies any other symptoms. Pertinent positives are listed and detailed within the above HPI.   REVIEW OF SYSTEMS:   Constitutional: Denies fevers, chills or abnormal weight loss Eyes: Denies blurriness of vision Ears, nose, mouth, throat, and face: Denies mucositis or sore throat Respiratory: Denies cough, dyspnea or wheezes Cardiovascular: Denies palpitation, chest discomfort or lower extremity swelling Gastrointestinal:  Denies nausea, heartburn or change in bowel habits Skin: Denies abnormal skin rashes Lymphatics: Denies new lymphadenopathy or easy bruising Neurological:Denies numbness, tingling or new weaknesses Behavioral/Psych: Mood is stable, no new changes  All other systems were reviewed with the patient and are negative.  MEDICAL  HISTORY:  Past Medical History:  Diagnosis Date  . Allergic rhinitis   . Anemia, iron deficiency 05/03/2011   2/2 GI AVMs - recieves iron infusions as well as periodic red blood cell transfusions  . Anxiety   . Arteriovenous malformation of duodenum 02/25/2015  . CARPAL TUNNEL RELEASE, HX OF 07/22/2008  . Colon polyps   . Diabetes mellitus without complication ([place])    type 2  . Difficulty sleeping    takes trazadone for sleep  . Diverticulosis   . GERD (gastroesophageal reflux disease)   . Hepatitis C    in SVR with Harvoni 12/2014-02/2015.   Marland Kitchen History of hernia repair 08/18/2012  . History of porphyria 11/30/2014   Porphyria cutanea tarda in setting of chronic Hepatitis C, frequent  IV iron infusions, and alcohol abuse   . Hypertension   . Peripheral vascular disease ([place])   . Stroke [facility]) 2010   no residual deficits  . Substance use disorder    cocaine    SURGICAL HISTORY: Past Surgical History:  Procedure Laterality Date  . ABDOMINAL HYSTERECTOMY    . APPLICATION OF WOUND VAC N/A 06/21/2014   Procedure: APPLICATION OF WOUND VAC;  Surgeon: Coralie Keens, MD;  Location: [facility];  Service: General;  Laterality: N/A;  . CARPAL TUNNEL RELEASE     rt hand  . COLONOSCOPY WITH PROPOFOL N/A 03/17/2018   Procedure: COLONOSCOPY WITH PROPOFOL;  Surgeon: Doran Stabler, MD;  [facility]: [facility];  Service: Gastroenterology;  Laterality: N/A;  . ENTEROSCOPY N/A 12/04/2012   Procedure: ENTEROSCOPY;  Surgeon: Beryle Beams, MD;  [facility];  Service: Endoscopy;  Laterality: N/A;  . ENTEROSCOPY N/A 12/18/2012   Procedure: ENTEROSCOPY;  Surgeon: Beryle Beams,  MD;  [facility];  Service: Endoscopy;  Laterality: N/A;  . ENTEROSCOPY N/A 02/25/2015   Procedure: ENTEROSCOPY;  Surgeon: Inda Castle, MD;  Location: [facility] ENDOSCOPY;  Service: Endoscopy;  Laterality: N/A;  . ENTEROSCOPY N/A 10/29/2017   Procedure: ENTEROSCOPY;  Surgeon: Doran Stabler, MD;  Location:  [facility]OS[facility];  Service: Gastroenterology;  Laterality: N/A;  . ESOPHAGOGASTRODUODENOSCOPY  05/05/2011   Procedure: ESOPHAGOGASTRODUODENOSCOPY (EGD);  Surgeon: Zenovia Jarred, MD;  Location: [facility];  Service: Gastroenterology;  Laterality: N/A;  Dr. Hilarie Fredrickson will do procedure for Dr. Benson Norway Saturday.  . ESOPHAGOGASTRODUODENOSCOPY  05/08/2011   Procedure: ESOPHAGOGASTRODUODENOSCOPY (EGD);  Surgeon: Beryle Beams;  [facility];  Service: Endoscopy;  Laterality: N/A;  . ESOPHAGOGASTRODUODENOSCOPY  06/07/2011   Procedure: ESOPHAGOGASTRODUODENOSCOPY (EGD);  Surgeon: Beryle Beams, MD;  Location: [facility];  Service: Endoscopy;  Laterality: N/A;  . ESOPHAGOGASTRODUODENOSCOPY  12/20/2011   Procedure: ESOPHAGOGASTRODUODENOSCOPY (EGD);  Surgeon: Beryle Beams, MD;  Location: [facility];  Service: Endoscopy;  Laterality: N/A;  . EYE SURGERY Bilateral    cataracts  . FLEXIBLE SIGMOIDOSCOPY  12/21/2011   Procedure: FLEXIBLE SIGMOIDOSCOPY;  Surgeon: Beryle Beams, MD;  [facility];  Service: Endoscopy;  Laterality: N/A;  . HOT HEMOSTASIS  06/07/2011   Procedure: HOT HEMOSTASIS (ARGON PLASMA COAGULATION/BICAP);  Surgeon: Beryle Beams, MD;  Location: [facility];  Service: Endoscopy;  Laterality: N/A;  . INCISIONAL HERNIA REPAIR N/A 06/01/2014   Procedure: ATTEMPTED LAPAROSCOPIC AND OPEN INCISIONAL HERNIA REPAIR WITH MESH;  Surgeon: Coralie Keens, MD;  Location: [place];  Service: General;  Laterality: N/A;  . INSERTION OF MESH N/A 06/01/2014   Procedure: INSERTION OF MESH;  Surgeon: Coralie Keens, MD;  Location: [place];  Service: General;  Laterality: N/A;  . LAPAROTOMY  02/16/2012   Procedure: EXPLORATORY LAPAROTOMY;  Surgeon: Edward Jolly, MD;  [facility];  Service: General;  Laterality: N/A;  oversewing of anastomotic leak and rigid sigmoidoscopy  . LAPAROTOMY N/A 06/21/2014   Procedure: ABDOMINAL WOUND EXPLORATION;  Surgeon: Coralie Keens, MD;  Location: [place];   Service: General;  Laterality: N/A;  . LEG AMPUTATION ABOVE KNEE     left  . PARTIAL COLECTOMY  02/15/2012   Procedure: PARTIAL COLECTOMY;  Surgeon: Harl Bowie, MD;  [facility];  Service: General;  Laterality: N/A;  . TONSILLECTOMY      I have reviewed the social history and family history with the patient and they are unchanged from previous note.  ALLERGIES:  is allergic to ciprofloxacin; morphine and related; penicillins; codeine; lisinopril; and feraheme [ferumoxytol].  MEDICATIONS:  Current Outpatient Medications  Medication Sig Dispense Refill  . ACCU-CHEK FASTCLIX LANCETS MISC Use to test blood glucose 3 times daily. 100 each 11  . ACCU-CHEK GUIDE test strip CHECK BLOOD SUGAR 3 TIMES A DAY 100 each 11  . allopurinol (ZYLOPRIM) 300 MG tablet TAKE 1 TABLET BY MOUTH ONCE DAILY 30 tablet 5  . aspirin EC 81 MG tablet Take 1 tablet (81 mg total) by mouth daily. 30 tablet 1  . cetirizine (ZYRTEC) 10 MG tablet TAKE 1 TABLET BY MOUTH DAILY 90 tablet 0  . cholecalciferol (VITAMIN D) 1000 units tablet Take 1 tablet (1,000 Units total) by mouth daily. 100 tablet 2  . colchicine 0.6 MG tablet TAKE ONE TABLET BY MOUTH DAILY 30 tablet 1  . DILT-XR 180 MG 24 hr capsule TAKE 1 CAPSULE BY MOUTH ONCE DAILY 90 capsule 1  . diphenhydramine-acetaminophen (TYLENOL PM) 25-500  MG TABS tablet Take 1 tablet by mouth at bedtime as needed (sleep).    . feeding supplement, GLUCERNA SHAKE, (GLUCERNA SHAKE) LIQD Take 237 mLs by mouth 3 (three) times daily between meals. (Patient taking differently: Take 237 mLs by mouth 2 (two) times daily between meals. ) 90 Can 0  . ferrous sulfate 325 (65 FE) MG EC tablet Take 1 tablet (325 mg total) by mouth daily after breakfast. 30 tablet 1  . FLUoxetine (PROZAC) 20 MG capsule TAKE 3 CAPSULES BY MOUTH DAILY 90 capsule 5  . gabapentin (NEURONTIN) 400 MG capsule TAKE 2 CAPSULES BY MOUTH THREE TIMES A DAY (Patient taking differently: Take 800 mg by mouth 3 (three)  times daily. ) 180 capsule 5  . Insulin Glargine (LANTUS SOLOSTAR) 100 UNIT/ML Solostar Pen Inject 10 Units into the skin at bedtime. 15 mL 2  . Insulin Pen Needle (UNIFINE PENTIPS) 32G X 4 MM MISC Use to inject insulin once a day. The patient is insulin requiring, ICD 10 code E11.9. The patient injects 1 times per day. 100 each 2  . losartan (COZAAR) 100 MG tablet TAKE 1 TABLET BY MOUTH EVERY DAY 90 tablet 1  . metFORMIN (GLUCOPHAGE) 1000 MG tablet TAKE 1 TABLET (1,000 MG TOTAL) BY MOUTH TWICE A DAY WITH A MEAL. (Patient taking differently: Take 1,000 mg by mouth 2 (two) times daily with a meal. ) 180 tablet 1  . omeprazole (PRILOSEC) 20 MG capsule TAKE 1 CAPSULE BY MOUTH EVERY DAY 30 capsule 5  . ondansetron (ZOFRAN) 4 MG tablet Take 1 tablet (4 mg total) by mouth every 8 (eight) hours as needed for nausea or vomiting. 20 tablet 0  . PEG-KCl-NaCl-NaSulf-Na Asc-C (PLENVU) 140 g SOLR Take 140 g by mouth as directed. 1 each 0  . PEG-KCl-NaCl-NaSulf-Na Asc-C (PLENVU) 140 g SOLR Take 140 g by mouth as directed. 1 each 0  . potassium chloride SA (K-DUR,KLOR-CON) 20 MEQ tablet Take 20 mEq by mouth daily.     . sodium polystyrene (KAYEXALATE) powder Take 30g once today. 30 g 0  . tolnaftate (TINACTIN) 1 % cream Apply 1 application topically 2 (two) times daily. 30 g 0  . traZODone (DESYREL) 50 MG tablet TAKE ONE TABLET BY MOUTH AT BEDTIME 30 tablet 5  . vitamin C (ASCORBIC ACID) 500 MG tablet Take 500 mg by mouth every morning.     No current facility-administered medications for this visit.     PHYSICAL EXAMINATION:  Vitals:   05/19/18 0928 05/19/18 0943  BP: (!) 163/92 (!) 152/87  Pulse: (!) 101   Resp: 18   Temp: 98.6 F (37 C)   SpO2: 97%    Filed Weights   05/19/18 0928  Weight: 200 lb 8 oz (90.9 kg)    GENERAL:alert, no distress and comfortable SKIN: skin color, texture, turgor are normal, no rashes or significant lesions EYES: normal, Conjunctiva are pink and non-injected, sclera  clear OROPHARYNX:no exudate, no erythema and lips, buccal mucosa, and tongue normal  NECK: supple, thyroid normal size, non-tender, without nodularity LYMPH:  no palpable lymphadenopathy in the cervical, axillary or inguinal LUNGS: clear to auscultation and percussion with normal breathing effort HEART: regular rate & rhythm and no murmurs and no lower extremity edema ABDOMEN:abdomen soft, non-tender and normal bowel sounds Musculoskeletal:no cyanosis of digits and no clubbing  NEURO: alert & oriented x 3 with fluent speech, no focal motor/sensory deficits  LABORATORY DATA:  I have reviewed the data as listed CBC Latest Ref Rng &  Units 05/19/2018 05/02/2018 04/18/2018  WBC 4.0 - 10.5 K/uL 7.9 11.5(H) 8.0  Hemoglobin 12.0 - 15.0 g/dL 11.5(L) 10.5(L) 10.9(L)  Hematocrit 36.0 - 46.0 % 36.5 32.7(L) 34.9(L)  Platelets 150 - 400 K/uL 269 353 244     CMP Latest Ref Rng & Units 03/17/2018 01/29/2018 01/22/2018  Glucose 70 - 99 mg/dL 137(H) 95 109(H)  BUN 8 - 23 mg/dL - 30(H) 21  Creatinine 0.44 - 1.00 mg/dL - 1.33(H) 1.15(H)  Sodium 135 - 145 mmol/L 146(H) 138 141  Potassium 3.5 - 5.1 mmol/L 4.4 6.6(HH) 4.8  Chloride 98 - 111 mmol/L - 107 110  CO2 22 - 32 mmol/L - 22 22  Calcium 8.9 - 10.3 mg/dL - 9.5 9.4  Total Protein 6.5 - 8.1 g/dL - - 7.2  Total Bilirubin 0.3 - 1.2 mg/dL - - <0.2(L)  Alkaline Phos 38 - 126 U/L - - 84  AST 15 - 41 U/L - - 45(H)  ALT 0 - 44 U/L - - 43      RADIOGRAPHIC STUDIES: I have personally reviewed the radiological images as listed and agreed with the findings in the report. No results found.   ASSESSMENT & PLAN:  No problem-specific Assessment & Plan notes found for this encounter. [name] 67 y.o. female with a history of   1. Iron deficiency anemia secondary to GI loss, with aspects of Anemia of Chronic Disease  -History of multiple GI bleeding in 2016, required blood transfusion, hospitalization and IV Feraheme -She previously required blood  transfusion periodically, much less lately  -She has been receiving IV Feraheme every one month on average lately -Due to her PCT, we'll be cautious with IV iron. Her PCT has resolved due to her successfully treated hepatitis C, I'll be okay to be more liberal to replace her iron deficiency. -She had a moderate response to IV iron recently.   -She has been on oral iron for 5-6 years now and has had black watery stool.  -She had an EGD and colonoscopy on 10/29/2017 and 03/17/2018 with Dr. Loletha Carrow, which was normal.  -she previously had pushed endoscopy in [facility] and was found to have AVM in small bowel. I discussed if she experience more GI bleeding, I will refer her back to GI at [place] from 05/19/2018, show: RBC at 3.61 and Hgb at 11.5, anemia improved lately  -Labs reviewed with patient today.  -Continue to check her labs in 2 weeks, and will set up iv feraheme with goal ferritin >100. -Lab in 4, 8 and 12 weeks  -F/u in 12 weeks    2. Porphyria cutaneous tarda (PCT), resolved -per pt, she had significant sun sensitivity which caused skin blisters on her arms and legs, she has multiple skin pigmentations from healed skin rash on her arms. -Both her urine and serum porphyria, especially uroporphyrin and coproporphyrin all elevated.  -This is likely secondary to her hepatitis C -She has previously completed treatment for hepatitis C. Her PCT has resolved now   3. chronic hepatitis C infection -She previously tested positive for hepatitis C in 2009. She has previously completed treatment for hepatitis C in 2016 and repeated test was negative.  -liver function has previously improved -She was previously seen by GI Dr. Deatra Ina who has since left the practice. She was previously given GI [place] clinic's information to be seen.  -She is due for Liver US, and AFP for liver cancer screening.  -Will order a liver US to be completed  in the near future, 1-2 weeks  4. DM2 --Management  per PCP's office.  -Glucose was previously 341. I previously encouraged her to restart her metformin.  -Was hospitalized in March 2018 for high glucose levels greater than 1000.  -Continue to follow up closely with PCP -She monitors her BG at home   5. Left BKA  -She has a new prosthesis, she is wheelchair bound most of time, I previously encouraged her to continue physical therapy  6. Smoking cessation  -She is still actively smoking, she is not ready to quit, however, she knows that she needs to quit  7. Right knee pain and leg swelling  -likely secondary to osteoarthritis. She will follow up with PCP  -I previously encouraged her to use compression stock for edema, she will think about it  -Pain has improved and now intermittent, manageable.   Plan  Abdominal US in 1-2 weeks, I will call her with the result  Lab in 4, 8 and 12 weeks, will sep up iv iron of ferritin<100 or significant low iron levle F/u in 12 weeks      Orders Placed This Encounter  Procedures  . US Abdomen Complete    History of Hep C    Standing Status:   Future    Standing Expiration Date:   05/19/2019    Order Specific Question:   Reason for Exam (SYMPTOM  OR DIAGNOSIS REQUIRED)    Answer:   screening for liver cancer    Order Specific Question:   Preferred imaging location?    Answer:   [facility]   All questions were answered. The patient knows to call the clinic with any problems, questions or concerns. No barriers to learning was detected. I spent 15 minutes counseling the patient face to face. The total time spent in the appointment was 20 minutes and more than 50% was on counseling and review of test results     Truitt Merle, MD 05/19/2018   I, Soijett Blue am acting as scribe for Dr. Truitt Merle.  I have reviewed the above documentation for accuracy and completeness, and I agree with the above.

## 2018-05-19 ENCOUNTER — Encounter: Payer: Self-pay | Admitting: Hematology

## 2018-05-19 ENCOUNTER — Inpatient Hospital Stay (HOSPITAL_BASED_OUTPATIENT_CLINIC_OR_DEPARTMENT_OTHER): Payer: Medicare Other | Admitting: Hematology

## 2018-05-19 ENCOUNTER — Inpatient Hospital Stay: Payer: Medicare Other

## 2018-05-19 VITALS — BP 152/87 | HR 101 | Temp 98.6°F | Resp 18 | Ht 61.0 in | Wt 200.5 lb

## 2018-05-19 DIAGNOSIS — D500 Iron deficiency anemia secondary to blood loss (chronic): Secondary | ICD-10-CM | POA: Diagnosis not present

## 2018-05-19 DIAGNOSIS — Z8619 Personal history of other infectious and parasitic diseases: Secondary | ICD-10-CM

## 2018-05-19 DIAGNOSIS — F17210 Nicotine dependence, cigarettes, uncomplicated: Secondary | ICD-10-CM

## 2018-05-19 DIAGNOSIS — K922 Gastrointestinal hemorrhage, unspecified: Secondary | ICD-10-CM

## 2018-05-19 LAB — CBC WITH DIFFERENTIAL/PLATELET
Abs Immature Granulocytes: 0.13 10*3/uL — ABNORMAL HIGH (ref 0.00–0.07)
Basophils Absolute: 0.1 10*3/uL (ref 0.0–0.1)
Basophils Relative: 1 %
Eosinophils Absolute: 0.2 10*3/uL (ref 0.0–0.5)
Eosinophils Relative: 2 %
HCT: 36.5 % (ref 36.0–46.0)
Hemoglobin: 11.5 g/dL — ABNORMAL LOW (ref 12.0–15.0)
Immature Granulocytes: 2 %
LYMPHS PCT: 15 %
Lymphs Abs: 1.2 10*3/uL (ref 0.7–4.0)
MCH: 31.9 pg (ref 26.0–34.0)
MCHC: 31.5 g/dL (ref 30.0–36.0)
MCV: 101.1 fL — ABNORMAL HIGH (ref 80.0–100.0)
Monocytes Absolute: 0.9 10*3/uL (ref 0.1–1.0)
Monocytes Relative: 12 %
Neutro Abs: 5.4 10*3/uL (ref 1.7–7.7)
Neutrophils Relative %: 68 %
Platelets: 269 10*3/uL (ref 150–400)
RBC: 3.61 MIL/uL — AB (ref 3.87–5.11)
RDW: 15.9 % — ABNORMAL HIGH (ref 11.5–15.5)
WBC: 7.9 10*3/uL (ref 4.0–10.5)
nRBC: 0 % (ref 0.0–0.2)

## 2018-05-19 MED FILL — UNIFINE PENTIPS 32GX5/32": 32G X 4 MM | 90 days supply | Qty: 100 | Fill #2

## 2018-05-19 MED FILL — UNIFINE PENTIPS 32GX5/32: 32G X 4 MM | 90 days supply | Qty: 100 | Fill #2

## 2018-05-26 DIAGNOSIS — E119 Type 2 diabetes mellitus without complications: Secondary | ICD-10-CM | POA: Diagnosis not present

## 2018-05-30 ENCOUNTER — Other Ambulatory Visit: Payer: Medicare Other

## 2018-06-02 ENCOUNTER — Inpatient Hospital Stay: Payer: Medicare Other | Attending: Nurse Practitioner

## 2018-06-02 DIAGNOSIS — D500 Iron deficiency anemia secondary to blood loss (chronic): Secondary | ICD-10-CM | POA: Insufficient documentation

## 2018-06-02 LAB — CBC WITH DIFFERENTIAL/PLATELET
Abs Immature Granulocytes: 0.13 10*3/uL — ABNORMAL HIGH (ref 0.00–0.07)
Basophils Absolute: 0 10*3/uL (ref 0.0–0.1)
Basophils Relative: 0 %
Eosinophils Absolute: 0.2 10*3/uL (ref 0.0–0.5)
Eosinophils Relative: 2 %
HCT: 35.5 % — ABNORMAL LOW (ref 36.0–46.0)
HEMOGLOBIN: 11.4 g/dL — AB (ref 12.0–15.0)
Immature Granulocytes: 1 %
Lymphocytes Relative: 16 %
Lymphs Abs: 1.4 10*3/uL (ref 0.7–4.0)
MCH: 31.8 pg (ref 26.0–34.0)
MCHC: 32.1 g/dL (ref 30.0–36.0)
MCV: 99.2 fL (ref 80.0–100.0)
MONO ABS: 0.9 10*3/uL (ref 0.1–1.0)
Monocytes Relative: 10 %
Neutro Abs: 6.4 10*3/uL (ref 1.7–7.7)
Neutrophils Relative %: 71 %
Platelets: 302 10*3/uL (ref 150–400)
RBC: 3.58 MIL/uL — ABNORMAL LOW (ref 3.87–5.11)
RDW: 14.6 % (ref 11.5–15.5)
WBC: 9.1 10*3/uL (ref 4.0–10.5)
nRBC: 0 % (ref 0.0–0.2)

## 2018-06-02 LAB — FERRITIN: Ferritin: 99 ng/mL (ref 11–307)

## 2018-06-02 LAB — IRON AND TIBC
Iron: 47 ug/dL (ref 41–142)
Saturation Ratios: 17 % — ABNORMAL LOW (ref 21–57)
TIBC: 278 ug/dL (ref 236–444)
UIBC: 230 ug/dL (ref 120–384)

## 2018-06-04 ENCOUNTER — Encounter: Payer: Medicare Other | Admitting: Internal Medicine

## 2018-06-10 ENCOUNTER — Telehealth: Payer: Self-pay

## 2018-06-10 NOTE — Telephone Encounter (Signed)
-----   Message from Truitt Merle, MD sent at 06/08/2018  7:43 PM EST ----- Please let pt know her lab results, mild anemia stable, iron level good, no need iv iron for now, thanks  Truitt Merle  06/08/2018

## 2018-06-10 NOTE — Telephone Encounter (Signed)
Spoke with patient regarding lab results, per Dr. Burr Medico mild anemia is stable, iron level is good, no need for IV iron at presents.  Patient verbalized an understanding.

## 2018-06-12 ENCOUNTER — Other Ambulatory Visit: Payer: Self-pay | Admitting: Internal Medicine

## 2018-06-12 DIAGNOSIS — M109 Gout, unspecified: Secondary | ICD-10-CM

## 2018-06-12 DIAGNOSIS — E1142 Type 2 diabetes mellitus with diabetic polyneuropathy: Secondary | ICD-10-CM

## 2018-06-13 ENCOUNTER — Other Ambulatory Visit: Payer: Self-pay | Admitting: Internal Medicine

## 2018-06-13 DIAGNOSIS — IMO0001 Reserved for inherently not codable concepts without codable children: Secondary | ICD-10-CM

## 2018-06-13 DIAGNOSIS — E1165 Type 2 diabetes mellitus with hyperglycemia: Principal | ICD-10-CM

## 2018-06-13 DIAGNOSIS — Z794 Long term (current) use of insulin: Principal | ICD-10-CM

## 2018-06-16 ENCOUNTER — Inpatient Hospital Stay: Payer: Medicare Other

## 2018-06-16 DIAGNOSIS — D500 Iron deficiency anemia secondary to blood loss (chronic): Secondary | ICD-10-CM

## 2018-06-16 LAB — CBC WITH DIFFERENTIAL/PLATELET
ABS IMMATURE GRANULOCYTES: 0.17 10*3/uL — AB (ref 0.00–0.07)
Basophils Absolute: 0.1 10*3/uL (ref 0.0–0.1)
Basophils Relative: 1 %
Eosinophils Absolute: 0.2 10*3/uL (ref 0.0–0.5)
Eosinophils Relative: 2 %
HCT: 32 % — ABNORMAL LOW (ref 36.0–46.0)
Hemoglobin: 10.5 g/dL — ABNORMAL LOW (ref 12.0–15.0)
Immature Granulocytes: 2 %
Lymphocytes Relative: 16 %
Lymphs Abs: 1.6 10*3/uL (ref 0.7–4.0)
MCH: 32.7 pg (ref 26.0–34.0)
MCHC: 32.8 g/dL (ref 30.0–36.0)
MCV: 99.7 fL (ref 80.0–100.0)
Monocytes Absolute: 1 10*3/uL (ref 0.1–1.0)
Monocytes Relative: 11 %
NEUTROS ABS: 6.9 10*3/uL (ref 1.7–7.7)
Neutrophils Relative %: 68 %
Platelets: 350 10*3/uL (ref 150–400)
RBC: 3.21 MIL/uL — ABNORMAL LOW (ref 3.87–5.11)
RDW: 14.7 % (ref 11.5–15.5)
WBC: 9.9 10*3/uL (ref 4.0–10.5)
nRBC: 0 % (ref 0.0–0.2)

## 2018-06-18 ENCOUNTER — Encounter: Payer: Self-pay | Admitting: Internal Medicine

## 2018-06-18 ENCOUNTER — Ambulatory Visit (INDEPENDENT_AMBULATORY_CARE_PROVIDER_SITE_OTHER): Payer: Medicare Other | Admitting: Internal Medicine

## 2018-06-18 ENCOUNTER — Other Ambulatory Visit: Payer: Self-pay

## 2018-06-18 VITALS — BP 97/64 | HR 111 | Temp 99.2°F | Ht 61.0 in | Wt 199.1 lb

## 2018-06-18 DIAGNOSIS — Q2733 Arteriovenous malformation of digestive system vessel: Secondary | ICD-10-CM

## 2018-06-18 DIAGNOSIS — R21 Rash and other nonspecific skin eruption: Secondary | ICD-10-CM

## 2018-06-18 DIAGNOSIS — M109 Gout, unspecified: Secondary | ICD-10-CM

## 2018-06-18 DIAGNOSIS — E119 Type 2 diabetes mellitus without complications: Secondary | ICD-10-CM

## 2018-06-18 DIAGNOSIS — F329 Major depressive disorder, single episode, unspecified: Secondary | ICD-10-CM

## 2018-06-18 DIAGNOSIS — Z794 Long term (current) use of insulin: Secondary | ICD-10-CM | POA: Diagnosis not present

## 2018-06-18 DIAGNOSIS — F1490 Cocaine use, unspecified, uncomplicated: Secondary | ICD-10-CM

## 2018-06-18 DIAGNOSIS — B1920 Unspecified viral hepatitis C without hepatic coma: Secondary | ICD-10-CM

## 2018-06-18 DIAGNOSIS — I10 Essential (primary) hypertension: Secondary | ICD-10-CM

## 2018-06-18 DIAGNOSIS — F1990 Other psychoactive substance use, unspecified, uncomplicated: Secondary | ICD-10-CM

## 2018-06-18 DIAGNOSIS — Z79899 Other long term (current) drug therapy: Secondary | ICD-10-CM

## 2018-06-18 DIAGNOSIS — Z89612 Acquired absence of left leg above knee: Secondary | ICD-10-CM

## 2018-06-18 DIAGNOSIS — E785 Hyperlipidemia, unspecified: Secondary | ICD-10-CM

## 2018-06-18 DIAGNOSIS — M1A9XX0 Chronic gout, unspecified, without tophus (tophi): Secondary | ICD-10-CM

## 2018-06-18 LAB — POCT GLYCOSYLATED HEMOGLOBIN (HGB A1C): Hemoglobin A1C: 5.5 % (ref 4.0–5.6)

## 2018-06-18 LAB — GLUCOSE, CAPILLARY: Glucose-Capillary: 121 mg/dL — ABNORMAL HIGH (ref 70–99)

## 2018-06-18 NOTE — Assessment & Plan Note (Addendum)
A1c 5.5 today. She is taking metformin 1000mg  BID and lantus 10 units qhs.  Plan was to discuss her DM more in depth today, however that was not possible.  Will re-address next week when she follows up for her rash. She may do well with switching from lantus to victoza which will decrease risk of hypoglycemia and add cardiovascular benefit.  As she is no longer taking colchicine, will also address adding statin.

## 2018-06-18 NOTE — Assessment & Plan Note (Signed)
No gout flares. Last UA 3.8.  Plan: --continue allopurinol 300mg  daily

## 2018-06-18 NOTE — Assessment & Plan Note (Signed)
>>  ASSESSMENT AND PLAN FOR TYPE 2 DIABETES MELLITUS WITH DIABETIC NEUROPATHY (HCC) WRITTEN ON 06/18/2018  7:27 PM BY LORETA LEVELS, MD  A1c 5.5 today. She is taking metformin  1000mg  BID and lantus  10 units qhs.  Plan was to discuss her DM more in depth today, however that was not possible.  Will re-address next week when she follows up for her rash. She may do well with switching from lantus  to victoza which will decrease risk of hypoglycemia and add cardiovascular benefit.  As she is no longer taking colchicine , will also address adding statin.

## 2018-06-18 NOTE — Assessment & Plan Note (Signed)
Patient with relapse with crack and marijuana. Her counselor was able to join our visit today and she will start following up with her again.

## 2018-06-18 NOTE — Assessment & Plan Note (Signed)
Patient with macular skin lesions localized to upper buttocks and lateral hips/thighs. Unclear etiology at this time.  Plan: --f/u in 1 wk for biopsy

## 2018-06-18 NOTE — Assessment & Plan Note (Signed)
Stable on losartan 100mg  daily and diltiazem 180mg  daily. She is asymptomatic.  Plan: --continue losartan 100mg  daily and diltiazem 180mg  daily

## 2018-06-18 NOTE — Progress Notes (Signed)
   CC: rash  HPI:  [name] is a 68 y.o. with a PMH of HCV in SVR, T2DM, HTN, HLD, gout, MDD, chronic GI bleeding from AVMs, and cocaine use disorderpresenting to clinic for rash and substance use relapse.  Patient states she has relapsed to using crack cocaine a few times since our last visit related to feeling left out around her family. She wishes to not use again. She asked her counselor with whom she used to follow for her addiction, to join during her visit with me today. The patient then relayed her relapse and circumstances to her counselor as well. She was able to receive counseling from both of [place].   Patient also endorses a few week h/o of rash on her bottom and bilateral lateral hips. Initial rash was not painful or pruritic. She states it started scabbing over which prompted her to scratch, leading to some itchiness and tenderness. She has not had anything similar in the past.   Please see problem based Assessment and Plan for status of patients chronic conditions.  Past Medical History:  Diagnosis Date  . Allergic rhinitis   . Anemia, iron deficiency 05/03/2011   2/2 GI AVMs - recieves iron infusions as well as periodic red blood cell transfusions  . Anxiety   . Arteriovenous malformation of duodenum 02/25/2015  . CARPAL TUNNEL RELEASE, HX OF 07/22/2008  . Colon polyps   . Diabetes mellitus without complication ([place])    type 2  . Difficulty sleeping    takes trazadone for sleep  . Diverticulosis   . GERD (gastroesophageal reflux disease)   . Hepatitis C    in SVR with Harvoni 12/2014-02/2015.   Marland Kitchen History of hernia repair 08/18/2012  . History of porphyria 11/30/2014   Porphyria cutanea tarda in setting of chronic Hepatitis C, frequent  IV iron infusions, and alcohol abuse   . Hypertension   . Peripheral vascular disease ([place])   . Stroke [facility]) 2010   no residual deficits  . Substance use disorder    cocaine    Review of Systems:   Per HPI  Physical  Exam:  Vitals:   06/18/18 1405  BP: 97/64  Pulse: (!) 111  Temp: 99.2 F (37.3 C)  TempSrc: Oral  SpO2: 100%  Weight: 199 lb 1.6 oz (90.3 kg)  Height: 5\' 1"  (1.549 m)   GENERAL- alert, co-operative, appears as stated age, not in any distress. CARDIAC- RRR, no murmurs, rubs or gallops. RESP- Moving equal volumes of air, and clear to auscultation bilaterally, no wheezes or crackles. ABDOMEN- Soft, nontender, bowel sounds present. EXTREMITIES- s/p L AKA. pulse 2+ radial, symmetric, no pedal edema on RLE. SKIN- hyper pigmented, indurated, macular lesions on bilateral upper buttocks and bil lateral hips of varying sizes from 0.5cm to 3cm. Some with secondary changes and excoriation. No drainage, scaling, vesicles. No TTP.  PSYCH- Mood labile  Assessment & Plan:   See Encounters Tab for problem based charting.   Patient discussed with Dr. Nilsa Nutting, MD Internal Medicine PGY-3

## 2018-06-19 LAB — BMP8+ANION GAP
ANION GAP: 18 mmol/L (ref 10.0–18.0)
BUN/Creatinine Ratio: 17 (ref 12–28)
BUN: 15 mg/dL (ref 8–27)
CO2: 18 mmol/L — ABNORMAL LOW (ref 20–29)
Calcium: 9.7 mg/dL (ref 8.7–10.3)
Chloride: 102 mmol/L (ref 96–106)
Creatinine, Ser: 0.9 mg/dL (ref 0.57–1.00)
GFR calc Af Amer: 78 mL/min/{1.73_m2} (ref >59)
GFR calc non Af Amer: 67 mL/min/{1.73_m2} (ref >59)
Glucose: 80 mg/dL (ref 65–99)
Potassium: 4.7 mmol/L (ref 3.5–5.2)
Sodium: 138 mmol/L (ref 134–144)

## 2018-06-19 NOTE — Progress Notes (Signed)
Internal Medicine Clinic Attending  Case discussed with Dr. Svalina  at the time of the visit.  We reviewed the resident's history and exam and pertinent patient test results.  I agree with the assessment, diagnosis, and plan of care documented in the resident's note.  

## 2018-06-23 ENCOUNTER — Ambulatory Visit (HOSPITAL_COMMUNITY)
Admission: RE | Admit: 2018-06-23 | Discharge: 2018-06-23 | Disposition: A | Discharge status: Home / Self Care | Payer: Medicare Other | Source: Ambulatory Visit | Attending: Hematology | Admitting: Hematology

## 2018-06-23 DIAGNOSIS — Z8619 Personal history of other infectious and parasitic diseases: Secondary | ICD-10-CM | POA: Insufficient documentation

## 2018-06-26 ENCOUNTER — Encounter: Payer: Self-pay | Admitting: Internal Medicine

## 2018-06-26 ENCOUNTER — Ambulatory Visit (INDEPENDENT_AMBULATORY_CARE_PROVIDER_SITE_OTHER): Payer: Medicare Other | Admitting: Internal Medicine

## 2018-06-26 VITALS — BP 135/73 | HR 99 | Temp 98.4°F

## 2018-06-26 DIAGNOSIS — K5520 Angiodysplasia of colon without hemorrhage: Secondary | ICD-10-CM

## 2018-06-26 DIAGNOSIS — E785 Hyperlipidemia, unspecified: Secondary | ICD-10-CM

## 2018-06-26 DIAGNOSIS — E119 Type 2 diabetes mellitus without complications: Secondary | ICD-10-CM | POA: Diagnosis not present

## 2018-06-26 DIAGNOSIS — Z9714 Presence of artificial left leg (complete) (partial): Secondary | ICD-10-CM

## 2018-06-26 DIAGNOSIS — B354 Tinea corporis: Secondary | ICD-10-CM | POA: Diagnosis not present

## 2018-06-26 DIAGNOSIS — Z8619 Personal history of other infectious and parasitic diseases: Secondary | ICD-10-CM

## 2018-06-26 DIAGNOSIS — F329 Major depressive disorder, single episode, unspecified: Secondary | ICD-10-CM

## 2018-06-26 DIAGNOSIS — I10 Essential (primary) hypertension: Secondary | ICD-10-CM | POA: Diagnosis not present

## 2018-06-26 DIAGNOSIS — F1490 Cocaine use, unspecified, uncomplicated: Secondary | ICD-10-CM

## 2018-06-26 DIAGNOSIS — Z89612 Acquired absence of left leg above knee: Secondary | ICD-10-CM

## 2018-06-26 DIAGNOSIS — M109 Gout, unspecified: Secondary | ICD-10-CM | POA: Diagnosis not present

## 2018-06-26 DIAGNOSIS — S78112A Complete traumatic amputation at level between left hip and knee, initial encounter: Secondary | ICD-10-CM

## 2018-06-26 DIAGNOSIS — Z8719 Personal history of other diseases of the digestive system: Secondary | ICD-10-CM

## 2018-06-26 MED ORDER — FLUCONAZOLE 200 MG PO TABS: 200.0000 mg | ORAL_TABLET | ORAL | 0 refills | Status: DC

## 2018-06-26 MED FILL — FLUCONAZOLE 200 MG TABLET: 200 | 28 days supply | Qty: 4 | Fill #0

## 2018-06-26 NOTE — Progress Notes (Signed)
   CC: rash  HPI:  [name] is a 68 y.o. with a PMH of HCV in SVR, T2DM, HTN, HLD, gout, MDD, chronic GI bleeding from AVMs, and cocaine use disorderpresenting to clinic for rash.  Please see problem based Assessment and Plan for status of patients chronic conditions.  Past Medical History:  Diagnosis Date  . Allergic rhinitis   . Anemia, iron deficiency 05/03/2011   2/2 GI AVMs - recieves iron infusions as well as periodic red blood cell transfusions  . Anxiety   . Arteriovenous malformation of duodenum 02/25/2015  . CARPAL TUNNEL RELEASE, HX OF 07/22/2008  . Colon polyps   . Diabetes mellitus without complication ([place])    type 2  . Difficulty sleeping    takes trazadone for sleep  . Diverticulosis   . GERD (gastroesophageal reflux disease)   . Hepatitis C    in SVR with Harvoni 12/2014-02/2015.   Marland Kitchen History of hernia repair 08/18/2012  . History of porphyria 11/30/2014   Porphyria cutanea tarda in setting of chronic Hepatitis C, frequent  IV iron infusions, and alcohol abuse   . Hypertension   . Peripheral vascular disease ([place])   . Stroke [facility]) 2010   no residual deficits  . Substance use disorder    cocaine    Review of Systems:   Per HPI  Physical Exam:  Vitals:   06/26/18 1446  BP: 135/73  Pulse: 99  Temp: 98.4 F (36.9 C)  TempSrc: Oral  SpO2: 98%   GENERAL- alert, co-operative, appears as stated age, not in any distress. EXTREMITIES- s/p L AKA SKIN- Large ~4x5cm hyperpigmented, scaly plaque on left buttocks with surrounding smaller plaques with excoriation extending to bil lateral legs. No fluctuance or tenderness.   Assessment & Plan:   See Encounters Tab for problem based charting.   Patient seen with Dr. Gerrit Friends, MD Internal Medicine PGY-3

## 2018-06-26 NOTE — Assessment & Plan Note (Addendum)
Patient s/p L AKA with previously well fitting prosthesis which provided great assistance with ambulation. She had been regularly following with therapy. Since last prosthesis was fitted, she has lost weight (207lbs to 199lbs) which has led to an ill-fitting socket. She experiences loss of control with malrotation due to the current socket being too large in size and is falling off the residual limb. She is limited in its use as the socket is cutting in the medial thigh.  The patient does not have co-morbidities that will impact her mobility or her ability to function with a prosthesis. She continues to verbally communicate her desire to ambulate.  Patient had prior functional status of K3 level ambulation and the potential to be a K3 level prosthetic ambulator that spends a lot of time walking around on uneven terrain over obstacles, up and down stairs, and sometimes has to walk backwards.

## 2018-06-26 NOTE — Patient Instructions (Signed)
For your skin: Take 200mg  of diflucan once a week for 4 weeks

## 2018-06-26 NOTE — Assessment & Plan Note (Signed)
Patient with several week rash on her buttocks (initial outbreak) and upper legs. During last week's evaluation she had apparently been using old triamcinolone cream; a that time, the scaling of the rash was not apparent as it is today. This likely represents tinea infection with herald patch on buttocks and satellite lesions on bil legs from scratching.   Plan: --due to extensive area of lesions, will treat with diflucan qWeekly x4 doses --if no resolution then will pursue punch biopsy

## 2018-06-27 DIAGNOSIS — G933 Postviral fatigue syndrome: Secondary | ICD-10-CM | POA: Diagnosis not present

## 2018-06-30 ENCOUNTER — Telehealth: Payer: Self-pay

## 2018-06-30 NOTE — Telephone Encounter (Signed)
-----   Message from Truitt Merle, MD sent at 06/25/2018 10:45 PM EST ----- Please let pt know the [place] results, no concerns, thanks   Truitt Merle  06/25/2018

## 2018-06-30 NOTE — Telephone Encounter (Signed)
Spoke with patient regarding her Abdominal US results, per Dr. Burr Medico no concerns, patient verbalized an understanding and was appreciative of the call.

## 2018-07-01 NOTE — Progress Notes (Signed)
Internal Medicine Clinic Attending  I saw and evaluated the patient.  I personally confirmed the key portions of the history and exam documented by Dr. Svalina and I reviewed pertinent patient test results.  The assessment, diagnosis, and plan were formulated together and I agree with the documentation in the resident's note.  

## 2018-07-10 ENCOUNTER — Other Ambulatory Visit: Payer: Self-pay | Admitting: Internal Medicine

## 2018-07-10 DIAGNOSIS — M109 Gout, unspecified: Secondary | ICD-10-CM

## 2018-07-14 ENCOUNTER — Inpatient Hospital Stay: Payer: Medicare Other | Attending: Nurse Practitioner

## 2018-07-14 DIAGNOSIS — D500 Iron deficiency anemia secondary to blood loss (chronic): Secondary | ICD-10-CM | POA: Diagnosis not present

## 2018-07-14 DIAGNOSIS — K922 Gastrointestinal hemorrhage, unspecified: Secondary | ICD-10-CM | POA: Diagnosis not present

## 2018-07-14 DIAGNOSIS — G933 Postviral fatigue syndrome: Secondary | ICD-10-CM | POA: Diagnosis not present

## 2018-07-14 LAB — CBC WITH DIFFERENTIAL/PLATELET
Abs Immature Granulocytes: 0.06 10*3/uL (ref 0.00–0.07)
BASOS ABS: 0 10*3/uL (ref 0.0–0.1)
Basophils Relative: 0 %
Eosinophils Absolute: 0.2 10*3/uL (ref 0.0–0.5)
Eosinophils Relative: 2 %
HCT: 29.9 % — ABNORMAL LOW (ref 36.0–46.0)
Hemoglobin: 9.3 g/dL — ABNORMAL LOW (ref 12.0–15.0)
Immature Granulocytes: 1 %
LYMPHS PCT: 21 %
Lymphs Abs: 1.7 10*3/uL (ref 0.7–4.0)
MCH: 29.2 pg (ref 26.0–34.0)
MCHC: 31.1 g/dL (ref 30.0–36.0)
MCV: 93.7 fL (ref 80.0–100.0)
Monocytes Absolute: 1 10*3/uL (ref 0.1–1.0)
Monocytes Relative: 13 %
Neutro Abs: 4.9 10*3/uL (ref 1.7–7.7)
Neutrophils Relative %: 63 %
Platelets: 433 10*3/uL — ABNORMAL HIGH (ref 150–400)
RBC: 3.19 MIL/uL — ABNORMAL LOW (ref 3.87–5.11)
RDW: 15.1 % (ref 11.5–15.5)
WBC: 7.8 10*3/uL (ref 4.0–10.5)
nRBC: 0 % (ref 0.0–0.2)

## 2018-07-14 LAB — COMPREHENSIVE METABOLIC PANEL
ALBUMIN: 3.7 g/dL (ref 3.5–5.0)
ALT: 11 U/L (ref 0–44)
AST: 20 U/L (ref 15–41)
Alkaline Phosphatase: 81 U/L (ref 38–126)
Anion gap: 12 (ref 5–15)
BUN: 17 mg/dL (ref 8–23)
CO2: 21 mmol/L — ABNORMAL LOW (ref 22–32)
Calcium: 8.9 mg/dL (ref 8.9–10.3)
Chloride: 108 mmol/L (ref 98–111)
Creatinine, Ser: 1.04 mg/dL — ABNORMAL HIGH (ref 0.44–1.00)
GFR calc Af Amer: >60 mL/min (ref >60)
GFR calc non Af Amer: 56 mL/min — ABNORMAL LOW (ref >60)
Glucose, Bld: 90 mg/dL (ref 70–99)
Potassium: 4.6 mmol/L (ref 3.5–5.1)
Sodium: 141 mmol/L (ref 135–145)
Total Protein: 7.3 g/dL (ref 6.5–8.1)

## 2018-07-16 LAB — FERRITIN: Ferritin: 11 ng/mL (ref 11–307)

## 2018-07-17 ENCOUNTER — Telehealth: Payer: Self-pay | Admitting: Hematology

## 2018-07-17 ENCOUNTER — Telehealth: Payer: Self-pay

## 2018-07-17 NOTE — Telephone Encounter (Signed)
-----   Message from Truitt Merle, MD sent at 07/16/2018  6:54 PM EST ----- Please let her know her iron level is low now, anemia slightly worse, I will set up iv feraheme weekly X2 for her, thanks   Krista Blue

## 2018-07-17 NOTE — Telephone Encounter (Signed)
Scheduled appt per 2/20 sch message - pt is aware of appt date and time   

## 2018-07-17 NOTE — Telephone Encounter (Signed)
Spoke with patient regarding lab results, per Dr. Burr Medico iron level is low now, anemia is slightly worse, we will schedule IV iron once weekly x 2 weeks, patient states she received a phone call with the appointments already and verbalized an understanding.

## 2018-07-18 DIAGNOSIS — H00022 Hordeolum internum right lower eyelid: Secondary | ICD-10-CM | POA: Diagnosis not present

## 2018-07-18 MED FILL — TOBRAMYCIN 0.3 % SOLN: 0.3 | 25 days supply | Qty: 5 | Fill #0

## 2018-07-23 ENCOUNTER — Inpatient Hospital Stay: Payer: Medicare Other

## 2018-07-23 VITALS — BP 147/91 | HR 93 | Temp 98.4°F | Resp 18

## 2018-07-23 DIAGNOSIS — K922 Gastrointestinal hemorrhage, unspecified: Secondary | ICD-10-CM | POA: Diagnosis not present

## 2018-07-23 DIAGNOSIS — D500 Iron deficiency anemia secondary to blood loss (chronic): Secondary | ICD-10-CM | POA: Diagnosis not present

## 2018-07-23 MED ORDER — FAMOTIDINE 20 MG PO TABS
ORAL_TABLET | ORAL | Status: AC
  Filled 2018-07-23: qty 1

## 2018-07-23 MED ORDER — DIPHENHYDRAMINE HCL 50 MG/ML IJ SOLN
50.0000 mg | Freq: Once | INTRAMUSCULAR | Status: AC
  Administered 2018-07-23: 50 mg via INTRAVENOUS

## 2018-07-23 MED ORDER — DIPHENHYDRAMINE HCL 50 MG/ML IJ SOLN
INTRAMUSCULAR | Status: AC
  Filled 2018-07-23: qty 1

## 2018-07-23 MED ORDER — SODIUM CHLORIDE 0.9 % IV SOLN
510.0000 mg | Freq: Once | INTRAVENOUS | Status: AC
  Administered 2018-07-23: 510 mg via INTRAVENOUS
  Filled 2018-07-23: qty 17

## 2018-07-23 MED ORDER — SODIUM CHLORIDE 0.9 % IV SOLN
Freq: Once | INTRAVENOUS | Status: AC
  Administered 2018-07-23: 15:00:00 via INTRAVENOUS
  Filled 2018-07-23: qty 250

## 2018-07-23 MED ORDER — FAMOTIDINE 20 MG PO TABS
20.0000 mg | ORAL_TABLET | Freq: Once | ORAL | Status: AC
  Administered 2018-07-23: 20 mg via ORAL

## 2018-07-23 NOTE — Progress Notes (Signed)
PT declined 30 minute observation

## 2018-07-23 NOTE — Patient Instructions (Signed)

## 2018-07-28 ENCOUNTER — Other Ambulatory Visit: Payer: Medicare Other

## 2018-08-01 ENCOUNTER — Inpatient Hospital Stay: Payer: Medicare Other | Attending: Nurse Practitioner

## 2018-08-01 VITALS — BP 135/75 | HR 84 | Temp 98.7°F | Resp 16

## 2018-08-01 DIAGNOSIS — Z993 Dependence on wheelchair: Secondary | ICD-10-CM | POA: Diagnosis not present

## 2018-08-01 DIAGNOSIS — Z89512 Acquired absence of left leg below knee: Secondary | ICD-10-CM | POA: Insufficient documentation

## 2018-08-01 DIAGNOSIS — I739 Peripheral vascular disease, unspecified: Secondary | ICD-10-CM | POA: Diagnosis not present

## 2018-08-01 DIAGNOSIS — K219 Gastro-esophageal reflux disease without esophagitis: Secondary | ICD-10-CM | POA: Diagnosis not present

## 2018-08-01 DIAGNOSIS — F419 Anxiety disorder, unspecified: Secondary | ICD-10-CM | POA: Insufficient documentation

## 2018-08-01 DIAGNOSIS — Z794 Long term (current) use of insulin: Secondary | ICD-10-CM | POA: Insufficient documentation

## 2018-08-01 DIAGNOSIS — Z79899 Other long term (current) drug therapy: Secondary | ICD-10-CM | POA: Insufficient documentation

## 2018-08-01 DIAGNOSIS — I10 Essential (primary) hypertension: Secondary | ICD-10-CM | POA: Insufficient documentation

## 2018-08-01 DIAGNOSIS — Z8673 Personal history of transient ischemic attack (TIA), and cerebral infarction without residual deficits: Secondary | ICD-10-CM | POA: Diagnosis not present

## 2018-08-01 DIAGNOSIS — K922 Gastrointestinal hemorrhage, unspecified: Secondary | ICD-10-CM

## 2018-08-01 DIAGNOSIS — D500 Iron deficiency anemia secondary to blood loss (chronic): Secondary | ICD-10-CM

## 2018-08-01 DIAGNOSIS — B182 Chronic viral hepatitis C: Secondary | ICD-10-CM | POA: Insufficient documentation

## 2018-08-01 DIAGNOSIS — Z7982 Long term (current) use of aspirin: Secondary | ICD-10-CM | POA: Diagnosis not present

## 2018-08-01 DIAGNOSIS — E119 Type 2 diabetes mellitus without complications: Secondary | ICD-10-CM | POA: Insufficient documentation

## 2018-08-01 DIAGNOSIS — G4700 Insomnia, unspecified: Secondary | ICD-10-CM | POA: Diagnosis not present

## 2018-08-01 DIAGNOSIS — F17210 Nicotine dependence, cigarettes, uncomplicated: Secondary | ICD-10-CM | POA: Insufficient documentation

## 2018-08-01 MED ORDER — FAMOTIDINE 20 MG PO TABS
20.0000 mg | ORAL_TABLET | Freq: Once | ORAL | Status: AC
  Administered 2018-08-01: 20 mg via ORAL

## 2018-08-01 MED ORDER — SODIUM CHLORIDE 0.9 % IV SOLN
Freq: Once | INTRAVENOUS | Status: AC
  Administered 2018-08-01: 15:00:00 via INTRAVENOUS
  Filled 2018-08-01: qty 250

## 2018-08-01 MED ORDER — DIPHENHYDRAMINE HCL 50 MG/ML IJ SOLN
INTRAMUSCULAR | Status: AC
  Filled 2018-08-01: qty 1

## 2018-08-01 MED ORDER — SODIUM CHLORIDE 0.9 % IV SOLN
510.0000 mg | Freq: Once | INTRAVENOUS | Status: AC
  Administered 2018-08-01: 510 mg via INTRAVENOUS
  Filled 2018-08-01: qty 17

## 2018-08-01 MED ORDER — DIPHENHYDRAMINE HCL 50 MG/ML IJ SOLN
50.0000 mg | Freq: Once | INTRAMUSCULAR | Status: AC
  Administered 2018-08-01: 50 mg via INTRAVENOUS

## 2018-08-01 MED ORDER — FAMOTIDINE IN NACL 20-0.9 MG/50ML-% IV SOLN: 20.0000 mg | Freq: Once | INTRAVENOUS | Status: DC

## 2018-08-01 MED ORDER — FAMOTIDINE 20 MG PO TABS
ORAL_TABLET | ORAL | Status: AC
  Filled 2018-08-01: qty 1

## 2018-08-01 MED FILL — LANTUS SOLOSTAR 100 UNITS/M: 100 | 56 days supply | Qty: 9 | Fill #1 | Status: TO

## 2018-08-01 NOTE — Patient Instructions (Signed)

## 2018-08-05 ENCOUNTER — Telehealth: Payer: Self-pay | Admitting: *Deleted

## 2018-08-05 NOTE — Telephone Encounter (Signed)
Needs refill of cholchicine, last fill date per pharm 07/07/2018

## 2018-08-06 NOTE — Progress Notes (Signed)
[name]   [phone] [phone]   Clinic Follow up Note   Patient Care Team: [name], MD as PCP - Evalina Field, MD as Consulting Physician (Ophthalmology) Coralie Keens, MD as Consulting Physician (General Surgery) Truitt Merle, MD as Consulting Physician (Hematology) Comer, Okey Regal, MD as Consulting Physician (Infectious Diseases)  Date of Service:  08/11/2018  CHIEF COMPLAINT: F/u of Anemia  CURRENT THERAPY:  IV Feraheme as needed if ferritin <100  INTERVAL HISTORY:  [name] is here for a follow up of anemia. She presents to the clinic today by herself. She notes she is fine but sleepy today. She notes her energy level overall is good and felt better after her IV Iron 2 weeks ago. She denies any evidence of bleeding.    REVIEW OF SYSTEMS:   Constitutional: Denies fevers, chills or abnormal weight loss Eyes: Denies blurriness of vision Ears, nose, mouth, throat, and face: Denies mucositis or sore throat Respiratory: Denies cough, dyspnea or wheezes Cardiovascular: Denies palpitation, chest discomfort or lower extremity swelling Gastrointestinal:  Denies nausea, heartburn or change in bowel habits Skin: Denies abnormal skin rashes MSK: (+) Left BKA with prosthesis, in wheelchair  Lymphatics: Denies new lymphadenopathy or easy bruising Neurological:Denies numbness, tingling or new weaknesses Behavioral/Psych: Mood is stable, no new changes  All other systems were reviewed with the patient and are negative.  MEDICAL HISTORY:  Past Medical History:  Diagnosis Date  . Allergic rhinitis   . Anemia, iron deficiency 05/03/2011   2/2 GI AVMs - recieves iron infusions as well as periodic red blood cell transfusions  . Anxiety   . Arteriovenous malformation of duodenum 02/25/2015  . CARPAL TUNNEL RELEASE, HX OF 07/22/2008  . Colon polyps   . Diabetes mellitus without complication ([place])    type 2  . Difficulty  sleeping    takes trazadone for sleep  . Diverticulosis   . GERD (gastroesophageal reflux disease)   . Hepatitis C    in SVR with Harvoni 12/2014-02/2015.   Marland Kitchen History of hernia repair 08/18/2012  . History of porphyria 11/30/2014   Porphyria cutanea tarda in setting of chronic Hepatitis C, frequent  IV iron infusions, and alcohol abuse   . Hypertension   . Peripheral vascular disease ([place])   . Stroke [facility]) 2010   no residual deficits  . Substance use disorder    cocaine    SURGICAL HISTORY: Past Surgical History:  Procedure Laterality Date  . ABDOMINAL HYSTERECTOMY    . APPLICATION OF WOUND VAC N/A 06/21/2014   Procedure: APPLICATION OF WOUND VAC;  Surgeon: Coralie Keens, MD;  Location: [facility];  Service: General;  Laterality: N/A;  . CARPAL TUNNEL RELEASE     rt hand  . COLONOSCOPY WITH PROPOFOL N/A 03/17/2018   Procedure: COLONOSCOPY WITH PROPOFOL;  Surgeon: Doran Stabler, MD;  [facility];  Service: Gastroenterology;  Laterality: N/A;  . ENTEROSCOPY N/A 12/04/2012   Procedure: ENTEROSCOPY;  Surgeon: Beryle Beams, MD;  [facility];  Service: Endoscopy;  Laterality: N/A;  . ENTEROSCOPY N/A 12/18/2012   Procedure: ENTEROSCOPY;  Surgeon: Beryle Beams, MD;  [facility];  Service: Endoscopy;  Laterality: N/A;  . ENTEROSCOPY N/A 02/25/2015   Procedure: ENTEROSCOPY;  Surgeon: Inda Castle, MD;  Location: [facility];  Service: Endoscopy;  Laterality: N/A;  . ENTEROSCOPY N/A 10/29/2017   Procedure: ENTEROSCOPY;  Surgeon: Doran Stabler, MD;  [facility];  Service: Gastroenterology;  Laterality: N/A;  . ESOPHAGOGASTRODUODENOSCOPY  05/05/2011   Procedure: ESOPHAGOGASTRODUODENOSCOPY (EGD);  Surgeon: Zenovia Jarred, MD;  Location: [facility];  Service: Gastroenterology;  Laterality: N/A;  Dr. Hilarie Fredrickson will do procedure for Dr. Benson Norway Saturday.  . ESOPHAGOGASTRODUODENOSCOPY  05/08/2011   Procedure: ESOPHAGOGASTRODUODENOSCOPY (EGD);  Surgeon:  Beryle Beams;  [facility];  Service: Endoscopy;  Laterality: N/A;  . ESOPHAGOGASTRODUODENOSCOPY  06/07/2011   Procedure: ESOPHAGOGASTRODUODENOSCOPY (EGD);  Surgeon: Beryle Beams, MD;  Location: [facility];  Service: Endoscopy;  Laterality: N/A;  . ESOPHAGOGASTRODUODENOSCOPY  12/20/2011   Procedure: ESOPHAGOGASTRODUODENOSCOPY (EGD);  Surgeon: Beryle Beams, MD;  Location: [facility];  Service: Endoscopy;  Laterality: N/A;  . EYE SURGERY Bilateral    cataracts  . FLEXIBLE SIGMOIDOSCOPY  12/21/2011   Procedure: FLEXIBLE SIGMOIDOSCOPY;  Surgeon: Beryle Beams, MD;  [facility];  Service: Endoscopy;  Laterality: N/A;  . HOT HEMOSTASIS  06/07/2011   Procedure: HOT HEMOSTASIS (ARGON PLASMA COAGULATION/BICAP);  Surgeon: Beryle Beams, MD;  Location: [facility];  Service: Endoscopy;  Laterality: N/A;  . INCISIONAL HERNIA REPAIR N/A 06/01/2014   Procedure: ATTEMPTED LAPAROSCOPIC AND OPEN INCISIONAL HERNIA REPAIR WITH MESH;  Surgeon: Coralie Keens, MD;  Location: [facility];  Service: General;  Laterality: N/A;  . INSERTION OF MESH N/A 06/01/2014   Procedure: INSERTION OF MESH;  Surgeon: Coralie Keens, MD;  Location: [facility];  Service: General;  Laterality: N/A;  . LAPAROTOMY  02/16/2012   Procedure: EXPLORATORY LAPAROTOMY;  Surgeon: Edward Jolly, MD;  [facility];  Service: General;  Laterality: N/A;  oversewing of anastomotic leak and rigid sigmoidoscopy  . LAPAROTOMY N/A 06/21/2014   Procedure: ABDOMINAL WOUND EXPLORATION;  Surgeon: Coralie Keens, MD;  Location: [facility];  Service: General;  Laterality: N/A;  . LEG AMPUTATION ABOVE KNEE     left  . PARTIAL COLECTOMY  02/15/2012   Procedure: PARTIAL COLECTOMY;  Surgeon: Harl Bowie, MD;  [facility];  Service: General;  Laterality: N/A;  . TONSILLECTOMY      I have reviewed the social history and family history with the patient and they are unchanged from previous note.  ALLERGIES:  is allergic to  ciprofloxacin; morphine and related; penicillins; codeine; lisinopril; and feraheme [ferumoxytol].  MEDICATIONS:  Current Outpatient Medications  Medication Sig Dispense Refill  . ACCU-CHEK FASTCLIX LANCETS MISC Use to test blood glucose 3 times daily. 100 each 11  . ACCU-CHEK GUIDE test strip CHECK BLOOD SUGAR 3 TIMES A DAY 100 each 11  . allopurinol (ZYLOPRIM) 300 MG tablet TAKE 1 TABLET BY MOUTH ONCE DAILY 30 tablet 5  . aspirin EC 81 MG tablet Take 1 tablet (81 mg total) by mouth daily. 30 tablet 1  . cetirizine (ZYRTEC) 10 MG tablet TAKE 1 TABLET BY MOUTH DAILY 90 tablet 0  . cholecalciferol (VITAMIN D) 1000 units tablet Take 1 tablet (1,000 Units total) by mouth daily. 100 tablet 2  . DILT-XR 180 MG 24 hr capsule TAKE 1 CAPSULE BY MOUTH ONCE DAILY 90 capsule 3  . diphenhydramine-acetaminophen (TYLENOL PM) 25-500 MG TABS tablet Take 1 tablet by mouth at bedtime as needed (sleep).    . feeding supplement, GLUCERNA SHAKE, (GLUCERNA SHAKE) LIQD Take 237 mLs by mouth 3 (three) times daily between meals. (Patient taking differently: Take 237 mLs by mouth 2 (two) times daily between meals. ) 90 Can 0  . FLUoxetine (PROZAC) 20 MG capsule TAKE 3 CAPSULES BY MOUTH DAILY 90 capsule 5  . gabapentin (NEURONTIN) 400 MG  capsule TAKE 2 CAPSULES BY MOUTH THREE TIMES A DAY 180 capsule 5  . Insulin Glargine (LANTUS) 100 UNIT/ML Solostar Pen INJECT 10 UNITS SUBCUTANEOUSLY DAILY AT 10 PM 45 mL 1  . Insulin Pen Needle (UNIFINE PENTIPS) 32G X 4 MM MISC Use to inject insulin once a day. The patient is insulin requiring, ICD 10 code E11.9. The patient injects 1 times per day. 100 each 2  . losartan (COZAAR) 100 MG tablet TAKE 1 TABLET BY MOUTH EVERY DAY 90 tablet 1  . metFORMIN (GLUCOPHAGE) 1000 MG tablet TAKE 1 TABLET (1,000 MG TOTAL) BY MOUTH TWICE A DAY WITH A MEAL. (Patient taking differently: Take 1,000 mg by mouth 2 (two) times daily with a meal. ) 180 tablet 1  . omeprazole (PRILOSEC) 20 MG capsule TAKE 1  CAPSULE BY MOUTH EVERY DAY 90 capsule 3  . tolnaftate (TINACTIN) 1 % cream Apply 1 application topically 2 (two) times daily. 30 g 0  . traZODone (DESYREL) 50 MG tablet TAKE ONE TABLET BY MOUTH AT BEDTIME 30 tablet 5  . vitamin C (ASCORBIC ACID) 500 MG tablet Take 500 mg by mouth every morning.     No current facility-administered medications for this visit.     PHYSICAL EXAMINATION: ECOG PERFORMANCE STATUS: 2 - Symptomatic, <50% confined to bed  Vitals:   08/11/18 1428  BP: 130/80  Pulse: 94  Resp: 18  Temp: 98.2 F (36.8 C)  SpO2: 95%   Filed Weights   08/11/18 1428  Weight: 201 lb 8 oz (91.4 kg)    GENERAL:alert, no distress and comfortable SKIN: skin color, texture, turgor are normal, no rashes or significant lesions EYES: normal, Conjunctiva are pink and non-injected, sclera clear OROPHARYNX:no exudate, no erythema and lips, buccal mucosa, and tongue normal  NECK: supple, thyroid normal size, non-tender, without nodularity LYMPH:  no palpable lymphadenopathy in the cervical, axillary or inguinal LUNGS: clear to auscultation and percussion with normal breathing effort HEART: regular rate & rhythm and no murmurs and no lower extremity edema ABDOMEN:abdomen soft, non-tender and normal bowel sounds Musculoskeletal:no cyanosis of digits and no clubbing (+) Left BKA with prothesis, in wheelchair  NEURO: alert & oriented x 3 with fluent speech, no focal motor/sensory deficits  LABORATORY DATA:  I have reviewed the data as listed CBC Latest Ref Rng & Units 08/11/2018 07/14/2018 06/16/2018  WBC 4.0 - 10.5 K/uL 8.3 7.8 9.9  Hemoglobin 12.0 - 15.0 g/dL 10.8(L) 9.3(L) 10.5(L)  Hematocrit 36.0 - 46.0 % 36.3 29.9(L) 32.0(L)  Platelets 150 - 400 K/uL 365 433(H) 350     CMP Latest Ref Rng & Units 07/14/2018 06/18/2018 03/17/2018  Glucose 70 - 99 mg/dL 90 80 137(H)  BUN 8 - 23 mg/dL 17 15 -  Creatinine 0.44 - 1.00 mg/dL 1.04(H) 0.90 -  Sodium 135 - 145 mmol/L 141 138 146(H)   Potassium 3.5 - 5.1 mmol/L 4.6 4.7 4.4  Chloride 98 - 111 mmol/L 108 102 -  CO2 22 - 32 mmol/L 21(L) 18(L) -  Calcium 8.9 - 10.3 mg/dL 8.9 9.7 -  Total Protein 6.5 - 8.1 g/dL 7.3 - -  Total Bilirubin 0.3 - 1.2 mg/dL <0.2(L) - -  Alkaline Phos 38 - 126 U/L 81 - -  AST 15 - 41 U/L 20 - -  ALT 0 - 44 U/L 11 - -   Results for [name], [name] (MRN [id number]) as of 08/11/2018 14:58  Ref. Range 06/02/2018 12:56 07/14/2018 13:52  Iron Latest Ref Range: 41 - 142  ug/dL 47   UIBC Latest Ref Range: 120 - 384 ug/dL 230   TIBC Latest Ref Range: 236 - 444 ug/dL 278   Saturation Ratios Latest Ref Range: 21 - 57 % 17 (L)   Ferritin Latest Ref Range: 11 - 307 ng/mL 99 11     RADIOGRAPHIC STUDIES: I have personally reviewed the radiological images as listed and agreed with the findings in the report. No results found.   ASSESSMENT & PLAN:  [name] is a 68 y.o. female with   1. Iron deficiency anemia secondary to GI blood loss, with aspects of Anemia of Chronic Disease  -History of multiple GI bleeding in 2016, required blood transfusion, hospitalization and IV Feraheme -She previously required blood transfusion periodically, much less lately  -She has been receiving IV Feraheme every 1-2 month on average lately. She had a moderate response to IV iron recently.   -She has been on oral iron for 5-6 years now and has had black watery stool.  -She had an EGD and colonoscopy on 10/29/2017 and 03/17/2018 with Dr. Loletha Carrow, which was normal.  -She previously had pushed endoscopy in [facility] and was found to have AVM in small bowel. I discussed if she experience more GI bleeding, I will refer her back to GI at [facility] on 08/01/18. She responded moderately well, Hg improved to 10.8, she likely has anemia of chronic disease also. Goal is to keep her Hg >10 and ferritin>50.  -Labs reviewed, CBC WNL except hg improved to 10.8 and iron panel is still pending. I will give her another dose  of IV Feraheme today, due to very low ferritin (9) a month ago  -Continue to check her labs every 4 weeks, and will set up iv feraheme with goal ferritin >100. -f/u in 4 months.    2. Porphyria cutaneous tarda (PCT), resolved after Hep C Treatment   3. Chronic hepatitis C infection -She previously tested positive for hepatitis C in 2009. She has previously completed treatment for hepatitis C in 2016 and repeated test was negative.  -She was previously seen by GI Dr. Deatra Ina who has since left the practice. She was previously given GI [facility] clinic's information to be seen.  -05/2018 liver US was unremarkable. She is overdue for AFP.    4. DM2 -Management per PCP's office.  -On Lantus, Metformin -Was hospitalized in March 2018 for high glucose levels greater than 1000.  -Continue to follow up closely with PCP  5. Left BKA, has prosthesis, she is wheelchair bound most of time  6. Smoking cessation  -She is still actively smoking, she is not ready to quit, however, she knows that she needs to quit  7. Right knee pain and leg swelling -likely secondary to osteoarthritis. She will follow up with PCP  -Pain not mentioned today, likely much improved or resolved.    Plan  -Labs reviewed, hg at 10.8 today, proceed with IV Feraheme today  -Lab monthly X4 -will set up iv feraheme if ferritin <100 (will give two doses if ferritin<30) -F/u in 4 months    No problem-specific Assessment & Plan notes found for this encounter.   No orders of the defined types were placed in this encounter.  All questions were answered. The patient knows to call the clinic with any problems, questions or concerns. No barriers to learning was detected. I spent 15 minutes counseling the patient face to face. The total time spent in the appointment was 20  minutes and more than 50% was on counseling and review of test results     Truitt Merle, MD 08/11/2018   I, Joslyn Devon, am acting as scribe for Truitt Merle, MD.   I have reviewed the above documentation for accuracy and completeness, and I agree with the above.

## 2018-08-07 ENCOUNTER — Other Ambulatory Visit: Payer: Self-pay | Admitting: Internal Medicine

## 2018-08-07 DIAGNOSIS — I10 Essential (primary) hypertension: Secondary | ICD-10-CM

## 2018-08-07 DIAGNOSIS — M109 Gout, unspecified: Secondary | ICD-10-CM

## 2018-08-08 NOTE — Telephone Encounter (Signed)
Colchicine was stopped so refill not approved.  [name], MD

## 2018-08-11 ENCOUNTER — Inpatient Hospital Stay: Payer: Medicare Other

## 2018-08-11 ENCOUNTER — Other Ambulatory Visit: Payer: Self-pay

## 2018-08-11 ENCOUNTER — Encounter: Payer: Self-pay | Admitting: Hematology

## 2018-08-11 ENCOUNTER — Inpatient Hospital Stay (HOSPITAL_BASED_OUTPATIENT_CLINIC_OR_DEPARTMENT_OTHER): Payer: Medicare Other | Admitting: Hematology

## 2018-08-11 ENCOUNTER — Telehealth: Payer: Self-pay | Admitting: Hematology

## 2018-08-11 VITALS — BP 130/80 | HR 94 | Temp 98.2°F | Resp 18 | Ht 61.0 in | Wt 201.5 lb

## 2018-08-11 VITALS — BP 143/82 | HR 91 | Temp 98.9°F | Resp 18

## 2018-08-11 DIAGNOSIS — E119 Type 2 diabetes mellitus without complications: Secondary | ICD-10-CM | POA: Diagnosis not present

## 2018-08-11 DIAGNOSIS — B182 Chronic viral hepatitis C: Secondary | ICD-10-CM | POA: Diagnosis not present

## 2018-08-11 DIAGNOSIS — Z79899 Other long term (current) drug therapy: Secondary | ICD-10-CM

## 2018-08-11 DIAGNOSIS — D500 Iron deficiency anemia secondary to blood loss (chronic): Secondary | ICD-10-CM

## 2018-08-11 DIAGNOSIS — I10 Essential (primary) hypertension: Secondary | ICD-10-CM

## 2018-08-11 DIAGNOSIS — Z8673 Personal history of transient ischemic attack (TIA), and cerebral infarction without residual deficits: Secondary | ICD-10-CM

## 2018-08-11 DIAGNOSIS — Z794 Long term (current) use of insulin: Secondary | ICD-10-CM

## 2018-08-11 DIAGNOSIS — Z89512 Acquired absence of left leg below knee: Secondary | ICD-10-CM

## 2018-08-11 DIAGNOSIS — K219 Gastro-esophageal reflux disease without esophagitis: Secondary | ICD-10-CM | POA: Diagnosis not present

## 2018-08-11 DIAGNOSIS — I739 Peripheral vascular disease, unspecified: Secondary | ICD-10-CM | POA: Diagnosis not present

## 2018-08-11 DIAGNOSIS — Z7982 Long term (current) use of aspirin: Secondary | ICD-10-CM | POA: Diagnosis not present

## 2018-08-11 DIAGNOSIS — G4700 Insomnia, unspecified: Secondary | ICD-10-CM | POA: Diagnosis not present

## 2018-08-11 DIAGNOSIS — Z8619 Personal history of other infectious and parasitic diseases: Secondary | ICD-10-CM

## 2018-08-11 DIAGNOSIS — F17210 Nicotine dependence, cigarettes, uncomplicated: Secondary | ICD-10-CM

## 2018-08-11 DIAGNOSIS — Z993 Dependence on wheelchair: Secondary | ICD-10-CM | POA: Diagnosis not present

## 2018-08-11 DIAGNOSIS — F419 Anxiety disorder, unspecified: Secondary | ICD-10-CM

## 2018-08-11 DIAGNOSIS — K922 Gastrointestinal hemorrhage, unspecified: Secondary | ICD-10-CM

## 2018-08-11 LAB — CBC WITH DIFFERENTIAL/PLATELET
Abs Immature Granulocytes: 0.13 10*3/uL — ABNORMAL HIGH (ref 0.00–0.07)
Basophils Absolute: 0 10*3/uL (ref 0.0–0.1)
Basophils Relative: 1 %
EOS ABS: 0.2 10*3/uL (ref 0.0–0.5)
Eosinophils Relative: 2 %
HCT: 36.3 % (ref 36.0–46.0)
Hemoglobin: 10.8 g/dL — ABNORMAL LOW (ref 12.0–15.0)
Immature Granulocytes: 2 %
Lymphocytes Relative: 15 %
Lymphs Abs: 1.3 10*3/uL (ref 0.7–4.0)
MCH: 29.8 pg (ref 26.0–34.0)
MCHC: 29.8 g/dL — ABNORMAL LOW (ref 30.0–36.0)
MCV: 100 fL (ref 80.0–100.0)
Monocytes Absolute: 0.9 10*3/uL (ref 0.1–1.0)
Monocytes Relative: 11 %
Neutro Abs: 5.8 10*3/uL (ref 1.7–7.7)
Neutrophils Relative %: 69 %
Platelets: 365 10*3/uL (ref 150–400)
RBC: 3.63 MIL/uL — ABNORMAL LOW (ref 3.87–5.11)
RDW: 20.6 % — ABNORMAL HIGH (ref 11.5–15.5)
WBC: 8.3 10*3/uL (ref 4.0–10.5)
nRBC: 0 % (ref 0.0–0.2)

## 2018-08-11 LAB — IRON AND TIBC
Iron: 63 ug/dL (ref 41–142)
SATURATION RATIOS: 18 % — AB (ref 21–57)
TIBC: 350 ug/dL (ref 236–444)
UIBC: 287 ug/dL (ref 120–384)

## 2018-08-11 LAB — FERRITIN: Ferritin: 174 ng/mL (ref 11–307)

## 2018-08-11 MED ORDER — SODIUM CHLORIDE 0.9 % IV SOLN
INTRAVENOUS | Status: DC
  Administered 2018-08-11: 15:00:00 via INTRAVENOUS
  Filled 2018-08-11: qty 250

## 2018-08-11 MED ORDER — FAMOTIDINE 20 MG PO TABS
20.0000 mg | ORAL_TABLET | Freq: Once | ORAL | Status: AC
  Administered 2018-08-11: 20 mg via ORAL

## 2018-08-11 MED ORDER — DIPHENHYDRAMINE HCL 50 MG/ML IJ SOLN
INTRAMUSCULAR | Status: AC
  Filled 2018-08-11: qty 1

## 2018-08-11 MED ORDER — FAMOTIDINE IN NACL 20-0.9 MG/50ML-% IV SOLN: 20.0000 mg | Freq: Once | INTRAVENOUS | Status: DC

## 2018-08-11 MED ORDER — DIPHENHYDRAMINE HCL 50 MG/ML IJ SOLN
50.0000 mg | Freq: Once | INTRAMUSCULAR | Status: AC
  Administered 2018-08-11: 50 mg via INTRAVENOUS

## 2018-08-11 MED ORDER — SODIUM CHLORIDE 0.9 % IV SOLN
510.0000 mg | Freq: Once | INTRAVENOUS | Status: AC
  Administered 2018-08-11: 510 mg via INTRAVENOUS
  Filled 2018-08-11: qty 17

## 2018-08-11 MED ORDER — FAMOTIDINE 20 MG PO TABS
ORAL_TABLET | ORAL | Status: AC
  Filled 2018-08-11: qty 1

## 2018-08-11 NOTE — Telephone Encounter (Signed)
Scheduled appt per 3/16 los. ° °

## 2018-08-11 NOTE — Patient Instructions (Signed)

## 2018-08-14 ENCOUNTER — Other Ambulatory Visit: Payer: Self-pay | Admitting: Internal Medicine

## 2018-08-14 DIAGNOSIS — E118 Type 2 diabetes mellitus with unspecified complications: Secondary | ICD-10-CM

## 2018-08-14 DIAGNOSIS — I10 Essential (primary) hypertension: Secondary | ICD-10-CM

## 2018-08-14 DIAGNOSIS — Z794 Long term (current) use of insulin: Secondary | ICD-10-CM

## 2018-08-14 MED ORDER — INSULIN PEN NEEDLE 32G X 4 MM MISC: 2 refills | Status: DC

## 2018-08-14 MED ORDER — METFORMIN HCL 1000 MG PO TABS: 1000.0000 mg | ORAL_TABLET | Freq: Two times a day (BID) | ORAL | 3 refills | Status: DC

## 2018-08-14 MED ORDER — LOSARTAN POTASSIUM 100 MG PO TABS: 100.0000 mg | ORAL_TABLET | Freq: Every day | ORAL | 1 refills | Status: DC

## 2018-08-14 MED ORDER — TRAZODONE HCL 50 MG PO TABS: 50.0000 mg | ORAL_TABLET | Freq: Every day | ORAL | 5 refills | Status: DC

## 2018-08-19 DIAGNOSIS — Z89612 Acquired absence of left leg above knee: Secondary | ICD-10-CM | POA: Diagnosis not present

## 2018-08-19 DIAGNOSIS — G933 Postviral fatigue syndrome: Secondary | ICD-10-CM | POA: Diagnosis not present

## 2018-08-20 ENCOUNTER — Encounter: Payer: Medicare Other | Admitting: Internal Medicine

## 2018-08-26 DIAGNOSIS — Z89612 Acquired absence of left leg above knee: Secondary | ICD-10-CM | POA: Diagnosis not present

## 2018-09-01 ENCOUNTER — Other Ambulatory Visit: Payer: Self-pay | Admitting: Internal Medicine

## 2018-09-01 DIAGNOSIS — M109 Gout, unspecified: Secondary | ICD-10-CM

## 2018-09-11 ENCOUNTER — Inpatient Hospital Stay: Payer: Medicare Other | Attending: Nurse Practitioner

## 2018-09-11 ENCOUNTER — Other Ambulatory Visit: Payer: Self-pay

## 2018-09-11 DIAGNOSIS — Z79899 Other long term (current) drug therapy: Secondary | ICD-10-CM | POA: Diagnosis not present

## 2018-09-11 DIAGNOSIS — D500 Iron deficiency anemia secondary to blood loss (chronic): Secondary | ICD-10-CM

## 2018-09-11 LAB — CBC WITH DIFFERENTIAL/PLATELET
Abs Immature Granulocytes: 0.05 10*3/uL (ref 0.00–0.07)
Basophils Absolute: 0 10*3/uL (ref 0.0–0.1)
Basophils Relative: 0 %
Eosinophils Absolute: 0.2 10*3/uL (ref 0.0–0.5)
Eosinophils Relative: 2 %
HCT: 35.3 % — ABNORMAL LOW (ref 36.0–46.0)
Hemoglobin: 11.2 g/dL — ABNORMAL LOW (ref 12.0–15.0)
Immature Granulocytes: 1 %
Lymphocytes Relative: 13 %
Lymphs Abs: 1.2 10*3/uL (ref 0.7–4.0)
MCH: 31.3 pg (ref 26.0–34.0)
MCHC: 31.7 g/dL (ref 30.0–36.0)
MCV: 98.6 fL (ref 80.0–100.0)
Monocytes Absolute: 0.8 10*3/uL (ref 0.1–1.0)
Monocytes Relative: 9 %
Neutro Abs: 6.5 10*3/uL (ref 1.7–7.7)
Neutrophils Relative %: 75 %
Platelets: 309 10*3/uL (ref 150–400)
RBC: 3.58 MIL/uL — ABNORMAL LOW (ref 3.87–5.11)
RDW: 17.8 % — ABNORMAL HIGH (ref 11.5–15.5)
WBC: 8.7 10*3/uL (ref 4.0–10.5)
nRBC: 0 % (ref 0.0–0.2)

## 2018-09-11 LAB — FERRITIN: Ferritin: 44 ng/mL (ref 11–307)

## 2018-09-11 LAB — IRON AND TIBC
Iron: 35 ug/dL — ABNORMAL LOW (ref 41–142)
Saturation Ratios: 10 % — ABNORMAL LOW (ref 21–57)
TIBC: 364 ug/dL (ref 236–444)
UIBC: 328 ug/dL (ref 120–384)

## 2018-09-12 ENCOUNTER — Telehealth: Payer: Self-pay | Admitting: Hematology

## 2018-09-12 NOTE — Telephone Encounter (Signed)
Scheduled iron appt per sch msg. Called and spoke with patient. Confirmed date and time

## 2018-09-16 ENCOUNTER — Ambulatory Visit: Payer: Medicare Other

## 2018-09-17 ENCOUNTER — Inpatient Hospital Stay: Payer: Medicare Other

## 2018-09-17 ENCOUNTER — Other Ambulatory Visit: Payer: Self-pay

## 2018-09-17 VITALS — BP 143/77 | HR 93 | Temp 98.9°F | Resp 17

## 2018-09-17 DIAGNOSIS — Z79899 Other long term (current) drug therapy: Secondary | ICD-10-CM | POA: Diagnosis not present

## 2018-09-17 DIAGNOSIS — D500 Iron deficiency anemia secondary to blood loss (chronic): Secondary | ICD-10-CM

## 2018-09-17 DIAGNOSIS — K922 Gastrointestinal hemorrhage, unspecified: Secondary | ICD-10-CM

## 2018-09-17 DIAGNOSIS — G933 Postviral fatigue syndrome: Secondary | ICD-10-CM | POA: Diagnosis not present

## 2018-09-17 MED ORDER — FAMOTIDINE 20 MG PO TABS
20.0000 mg | ORAL_TABLET | Freq: Once | ORAL | Status: AC
  Administered 2018-09-17: 20 mg via ORAL

## 2018-09-17 MED ORDER — DIPHENHYDRAMINE HCL 50 MG/ML IJ SOLN
INTRAMUSCULAR | Status: AC
  Filled 2018-09-17: qty 1

## 2018-09-17 MED ORDER — FAMOTIDINE 20 MG PO TABS
ORAL_TABLET | ORAL | Status: AC
  Filled 2018-09-17: qty 1

## 2018-09-17 MED ORDER — DIPHENHYDRAMINE HCL 50 MG/ML IJ SOLN
50.0000 mg | Freq: Once | INTRAMUSCULAR | Status: AC
  Administered 2018-09-17: 14:00:00 50 mg via INTRAVENOUS

## 2018-09-17 MED ORDER — SODIUM CHLORIDE 0.9 % IV SOLN
INTRAVENOUS | Status: DC
  Administered 2018-09-17: 14:00:00 via INTRAVENOUS
  Filled 2018-09-17: qty 250

## 2018-09-17 MED ORDER — SODIUM CHLORIDE 0.9 % IV SOLN
510.0000 mg | Freq: Once | INTRAVENOUS | Status: AC
  Administered 2018-09-17: 510 mg via INTRAVENOUS
  Filled 2018-09-17: qty 17

## 2018-09-17 MED FILL — LANTUS SOLOSTAR 100 UNITS/M: 100 | 84 days supply | Qty: 9 | Fill #0

## 2018-09-17 NOTE — Patient Instructions (Signed)

## 2018-09-18 ENCOUNTER — Other Ambulatory Visit: Payer: Self-pay

## 2018-09-18 ENCOUNTER — Encounter: Payer: Self-pay | Admitting: General Practice

## 2018-09-18 NOTE — Progress Notes (Signed)
[facility] CSW Progress Notes  Request received from infusion room RN to reach out to patient who mentioned feeling "depressed" occasionally at home.  Called patient, she is struggling w overwhelming nature of sadness associated w losses in the world from [place].  Relies on her strong faith to overcome current challenges.  Support from family members.  Will continue to touch base w her as she lives alone, has multiple health concerns and admits to stress due to current pandemic.   Edwyna Shell, LCSW Clinical Social Worker Phone:  [phone]

## 2018-09-18 NOTE — Patient Outreach (Signed)
[facility]) Care Management  09/18/2018  [name] Oct 06, 1950 [id number]   Medication Adherence call to [name] Compliant Voice message left with a call back number. [name] is showing past due on Losartan 100 mg under [place].   [facility] Management Direct Dial [phone]  Fax [phone] [email or web address]

## 2018-09-23 ENCOUNTER — Other Ambulatory Visit: Payer: Self-pay | Admitting: Internal Medicine

## 2018-09-23 DIAGNOSIS — M109 Gout, unspecified: Secondary | ICD-10-CM

## 2018-09-24 ENCOUNTER — Encounter: Payer: Self-pay | Admitting: General Practice

## 2018-09-24 NOTE — Progress Notes (Signed)
[facility] CSW Progress Notes  Mailed patient copy of [place] and invitation to virtual support group, included note of encouragement.    Edwyna Shell, LCSW Clinical Social Worker Phone:  [phone]

## 2018-09-29 ENCOUNTER — Other Ambulatory Visit: Payer: Self-pay

## 2018-09-29 NOTE — Telephone Encounter (Signed)
losartan (COZAAR) 100 MG tablet, refill request @  [facility], [place] [phone] (Phone) [phone] (Fax)

## 2018-09-29 NOTE — Telephone Encounter (Signed)
#  90 with 1 refill sent on 08/14/2018. Spoke with Crystal at [employer]. They notified patient on 09/23/2018 to expect delivery tomorrow 09/30/2018. Hubbard Hartshorn, RN, BSN

## 2018-10-10 ENCOUNTER — Inpatient Hospital Stay: Payer: Medicare Other | Attending: Nurse Practitioner

## 2018-10-10 ENCOUNTER — Inpatient Hospital Stay: Payer: Medicare Other

## 2018-10-10 ENCOUNTER — Other Ambulatory Visit: Payer: Self-pay

## 2018-10-10 VITALS — BP 128/68 | HR 82 | Temp 98.5°F | Resp 18

## 2018-10-10 DIAGNOSIS — D500 Iron deficiency anemia secondary to blood loss (chronic): Secondary | ICD-10-CM | POA: Diagnosis not present

## 2018-10-10 DIAGNOSIS — Z79899 Other long term (current) drug therapy: Secondary | ICD-10-CM | POA: Insufficient documentation

## 2018-10-10 DIAGNOSIS — K922 Gastrointestinal hemorrhage, unspecified: Secondary | ICD-10-CM

## 2018-10-10 DIAGNOSIS — Z8619 Personal history of other infectious and parasitic diseases: Secondary | ICD-10-CM

## 2018-10-10 LAB — CBC WITH DIFFERENTIAL/PLATELET
Abs Immature Granulocytes: 0.06 10*3/uL (ref 0.00–0.07)
Basophils Absolute: 0 10*3/uL (ref 0.0–0.1)
Basophils Relative: 1 %
Eosinophils Absolute: 0.1 10*3/uL (ref 0.0–0.5)
Eosinophils Relative: 2 %
HCT: 34.8 % — ABNORMAL LOW (ref 36.0–46.0)
Hemoglobin: 11 g/dL — ABNORMAL LOW (ref 12.0–15.0)
Immature Granulocytes: 1 %
Lymphocytes Relative: 18 %
Lymphs Abs: 1.1 10*3/uL (ref 0.7–4.0)
MCH: 31.3 pg (ref 26.0–34.0)
MCHC: 31.6 g/dL (ref 30.0–36.0)
MCV: 99.1 fL (ref 80.0–100.0)
Monocytes Absolute: 0.8 10*3/uL (ref 0.1–1.0)
Monocytes Relative: 12 %
Neutro Abs: 4.2 10*3/uL (ref 1.7–7.7)
Neutrophils Relative %: 66 %
Platelets: 299 10*3/uL (ref 150–400)
RBC: 3.51 MIL/uL — ABNORMAL LOW (ref 3.87–5.11)
RDW: 16 % — ABNORMAL HIGH (ref 11.5–15.5)
WBC: 6.3 10*3/uL (ref 4.0–10.5)
nRBC: 0 % (ref 0.0–0.2)

## 2018-10-10 LAB — IRON AND TIBC
Iron: 32 ug/dL — ABNORMAL LOW (ref 41–142)
Saturation Ratios: 9 % — ABNORMAL LOW (ref 21–57)
TIBC: 361 ug/dL (ref 236–444)
UIBC: 329 ug/dL (ref 120–384)

## 2018-10-10 LAB — FERRITIN: Ferritin: 59 ng/mL (ref 11–307)

## 2018-10-10 MED ORDER — FAMOTIDINE 20 MG PO TABS
ORAL_TABLET | ORAL | Status: AC
  Filled 2018-10-10: qty 1

## 2018-10-10 MED ORDER — DIPHENHYDRAMINE HCL 50 MG/ML IJ SOLN
50.0000 mg | Freq: Once | INTRAMUSCULAR | Status: AC
  Administered 2018-10-10: 50 mg via INTRAVENOUS

## 2018-10-10 MED ORDER — FAMOTIDINE IN NACL 20-0.9 MG/50ML-% IV SOLN: 20.0000 mg | Freq: Once | INTRAVENOUS | Status: DC

## 2018-10-10 MED ORDER — SODIUM CHLORIDE 0.9 % IV SOLN
INTRAVENOUS | Status: DC
  Administered 2018-10-10: 14:00:00 via INTRAVENOUS
  Filled 2018-10-10: qty 250

## 2018-10-10 MED ORDER — FAMOTIDINE IN NACL 20-0.9 MG/50ML-% IV SOLN
INTRAVENOUS | Status: AC
  Filled 2018-10-10: qty 50

## 2018-10-10 MED ORDER — SODIUM CHLORIDE 0.9 % IV SOLN
510.0000 mg | Freq: Once | INTRAVENOUS | Status: AC
  Administered 2018-10-10: 510 mg via INTRAVENOUS
  Filled 2018-10-10: qty 510

## 2018-10-10 MED ORDER — DIPHENHYDRAMINE HCL 50 MG/ML IJ SOLN
INTRAMUSCULAR | Status: AC
  Filled 2018-10-10: qty 1

## 2018-10-10 MED ORDER — DIPHENHYDRAMINE HCL 25 MG PO CAPS
ORAL_CAPSULE | ORAL | Status: AC
  Filled 2018-10-10: qty 2

## 2018-10-10 MED ORDER — FAMOTIDINE 20 MG PO TABS
20.0000 mg | ORAL_TABLET | Freq: Once | ORAL | Status: AC
  Administered 2018-10-10: 14:00:00 20 mg via ORAL

## 2018-10-10 NOTE — Patient Instructions (Signed)

## 2018-10-10 NOTE — Progress Notes (Signed)
Duplicate Pepcid orders: has order for PO and IV.  Pt pref PO today & has been receiving PO w/ most recent Feraheme txs.  In order to avoid duplicate doses being given in error, I have d/c'd the IV Pepcid from the supportive Feraheme plan.  Kennith Center, Pharm.D., CPP 10/10/2018@1 :48 PM

## 2018-10-11 LAB — AFP TUMOR MARKER: AFP, Serum, Tumor Marker: 3.2 ng/mL (ref 0.0–8.3)

## 2018-10-13 DIAGNOSIS — Z89612 Acquired absence of left leg above knee: Secondary | ICD-10-CM | POA: Diagnosis not present

## 2018-10-14 ENCOUNTER — Telehealth: Payer: Self-pay | Admitting: *Deleted

## 2018-10-14 NOTE — Telephone Encounter (Signed)
Called pt & informed of Ferritin result & reminded of next month appt & time & will evaluate & set up iron if needed after that.  She expressed understanding.

## 2018-10-14 NOTE — Telephone Encounter (Signed)
-----   Message from Alla Feeling, NP sent at 10/14/2018 12:23 PM EDT ----- She received Feraheme last week. Please let her know ferritin is 59. Next lab check in 1 month (6/15) and will set up infusion if needed.  Thanks, Regan Rakers NP

## 2018-10-17 DIAGNOSIS — G933 Postviral fatigue syndrome: Secondary | ICD-10-CM | POA: Diagnosis not present

## 2018-10-27 ENCOUNTER — Other Ambulatory Visit: Payer: Self-pay | Admitting: Internal Medicine

## 2018-10-27 DIAGNOSIS — M109 Gout, unspecified: Secondary | ICD-10-CM

## 2018-10-30 ENCOUNTER — Other Ambulatory Visit: Payer: Self-pay | Admitting: Family Medicine

## 2018-10-30 ENCOUNTER — Other Ambulatory Visit: Payer: Self-pay | Admitting: Internal Medicine

## 2018-10-30 DIAGNOSIS — Z1231 Encounter for screening mammogram for malignant neoplasm of breast: Secondary | ICD-10-CM

## 2018-11-06 ENCOUNTER — Telehealth: Payer: Self-pay | Admitting: *Deleted

## 2018-11-06 ENCOUNTER — Encounter: Payer: Self-pay | Admitting: *Deleted

## 2018-11-06 NOTE — Telephone Encounter (Signed)
Received vm call yest from pt asking for return call.  Returned call today & pt states her hands are tight & ache & feel tingly & this is how she feels when her iron is low.  She has issues with transportation & has to let them know 3 days in advance so she needs to know if she can be scheduled for her on while here for lab on Monday.  She also states that her transportation reserves rides for dialysis & chemo pts on Thursdays so she would like to avoid that day for any scheduling for her.  Message routed to Dr Feng/Pod RN

## 2018-11-06 NOTE — Telephone Encounter (Signed)
[name], could you check if infusion can accommodate iv iron on Monday 6/15? If not, reschedule lab to other day next week (except Thursday) with infusion. I will send a high priority schedule message.    Truitt Merle MD

## 2018-11-07 NOTE — Telephone Encounter (Signed)
Notified pt of iron add on for Monday

## 2018-11-10 ENCOUNTER — Inpatient Hospital Stay: Payer: Medicare Other

## 2018-11-10 ENCOUNTER — Other Ambulatory Visit: Payer: Self-pay

## 2018-11-10 ENCOUNTER — Inpatient Hospital Stay: Payer: Medicare Other | Attending: Nurse Practitioner

## 2018-11-10 VITALS — BP 134/78 | HR 82 | Temp 98.6°F | Resp 16

## 2018-11-10 DIAGNOSIS — D500 Iron deficiency anemia secondary to blood loss (chronic): Secondary | ICD-10-CM

## 2018-11-10 DIAGNOSIS — K922 Gastrointestinal hemorrhage, unspecified: Secondary | ICD-10-CM

## 2018-11-10 DIAGNOSIS — Z79899 Other long term (current) drug therapy: Secondary | ICD-10-CM | POA: Insufficient documentation

## 2018-11-10 LAB — IRON AND TIBC
Iron: 86 ug/dL (ref 41–142)
Saturation Ratios: 23 % (ref 21–57)
TIBC: 373 ug/dL (ref 236–444)
UIBC: 286 ug/dL (ref 120–384)

## 2018-11-10 LAB — CBC WITH DIFFERENTIAL/PLATELET
Abs Immature Granulocytes: 0.05 10*3/uL (ref 0.00–0.07)
Basophils Absolute: 0 10*3/uL (ref 0.0–0.1)
Basophils Relative: 1 %
Eosinophils Absolute: 0.2 10*3/uL (ref 0.0–0.5)
Eosinophils Relative: 2 %
HCT: 38.5 % (ref 36.0–46.0)
Hemoglobin: 12.4 g/dL (ref 12.0–15.0)
Immature Granulocytes: 1 %
Lymphocytes Relative: 13 %
Lymphs Abs: 1 10*3/uL (ref 0.7–4.0)
MCH: 31.5 pg (ref 26.0–34.0)
MCHC: 32.2 g/dL (ref 30.0–36.0)
MCV: 97.7 fL (ref 80.0–100.0)
Monocytes Absolute: 0.8 10*3/uL (ref 0.1–1.0)
Monocytes Relative: 10 %
Neutro Abs: 5.7 10*3/uL (ref 1.7–7.7)
Neutrophils Relative %: 73 %
Platelets: 251 10*3/uL (ref 150–400)
RBC: 3.94 MIL/uL (ref 3.87–5.11)
RDW: 14.3 % (ref 11.5–15.5)
WBC: 7.8 10*3/uL (ref 4.0–10.5)
nRBC: 0 % (ref 0.0–0.2)

## 2018-11-10 LAB — FERRITIN: Ferritin: 35 ng/mL (ref 11–307)

## 2018-11-10 MED ORDER — SODIUM CHLORIDE 0.9 % IV SOLN
510.0000 mg | Freq: Once | INTRAVENOUS | Status: AC
  Administered 2018-11-10: 510 mg via INTRAVENOUS
  Filled 2018-11-10: qty 17

## 2018-11-10 MED ORDER — DIPHENHYDRAMINE HCL 50 MG/ML IJ SOLN
INTRAMUSCULAR | Status: AC
  Filled 2018-11-10: qty 1

## 2018-11-10 MED ORDER — FAMOTIDINE 20 MG PO TABS
ORAL_TABLET | ORAL | Status: AC
  Filled 2018-11-10: qty 1

## 2018-11-10 MED ORDER — FAMOTIDINE 20 MG PO TABS
20.0000 mg | ORAL_TABLET | Freq: Once | ORAL | Status: AC
  Administered 2018-11-10: 12:00:00 20 mg via ORAL

## 2018-11-10 MED ORDER — DIPHENHYDRAMINE HCL 50 MG/ML IJ SOLN
50.0000 mg | Freq: Once | INTRAMUSCULAR | Status: AC
  Administered 2018-11-10: 50 mg via INTRAVENOUS

## 2018-11-10 NOTE — Patient Instructions (Signed)

## 2018-11-14 ENCOUNTER — Telehealth: Payer: Self-pay

## 2018-11-14 DIAGNOSIS — G933 Postviral fatigue syndrome: Secondary | ICD-10-CM | POA: Diagnosis not present

## 2018-11-14 NOTE — Telephone Encounter (Signed)
Spoke with pt. Advised per Dr. Burr Medico no, anemia, ferritin slightly low and she will see pt next month. Pt verbalized understanding.

## 2018-11-14 NOTE — Telephone Encounter (Signed)
-----   Message from Truitt Merle, MD sent at 11/11/2018 10:44 AM EDT ----- Please let her know lab results from yesterday, no anemia, ferritin slightly low, she received iv iron yesterday, I will see her next month, thanks   Truitt Merle  11/11/2018

## 2018-11-24 ENCOUNTER — Other Ambulatory Visit: Payer: Self-pay | Admitting: Internal Medicine

## 2018-11-24 DIAGNOSIS — IMO0001 Reserved for inherently not codable concepts without codable children: Secondary | ICD-10-CM

## 2018-11-24 DIAGNOSIS — E1142 Type 2 diabetes mellitus with diabetic polyneuropathy: Secondary | ICD-10-CM

## 2018-11-24 DIAGNOSIS — M109 Gout, unspecified: Secondary | ICD-10-CM

## 2018-11-25 NOTE — Telephone Encounter (Signed)
According to chart, no longer taking colchicine, will not refill, but will refill gabapentin and test strips

## 2018-11-25 NOTE — Telephone Encounter (Signed)
Received refill request from pt's pharmay. ..Marland KitchenPCP unable to authorize refills at this time-request sent to attending pool.  Next appt 7/08 w/pcp.Patricia Ast Cassady6/30/20209:16 AM   .

## 2018-12-03 ENCOUNTER — Encounter: Payer: Medicare Other | Admitting: Internal Medicine

## 2018-12-08 NOTE — Progress Notes (Signed)
[place]   [phone] [phone]   Clinic Follow up Note   Patient Care Team: Jose Persia, MD as PCP - Evalina Field, MD as Consulting Physician (Ophthalmology) Coralie Keens, MD as Consulting Physician (General Surgery) Truitt Merle, MD as Consulting Physician (Hematology) Comer, Okey Regal, MD as Consulting Physician (Infectious Diseases)  Date of Service:  12/10/2018  CHIEF COMPLAINT: F/u of Anemia  CURRENT THERAPY:  IV Feraheme as needed if ferritin <100  INTERVAL HISTORY:  [name] is here for a follow up IDA. She presents to the clinic alone. She notes she feels tight. She notes she has [place] cramping and swells with numbness and aching. Her face is swelling as well. She notes she had heart issues with heart attack and mild stroke after her leg was amputated. She only saw a cardiologist once through her GI. She was not able to see her GI. I reviewed her medication list with her. She drinks 6-8 bottles of 12 ounce water.    REVIEW OF SYSTEMS:   Constitutional: Denies fevers, chills or abnormal weight loss Eyes: Denies blurriness of vision Ears, nose, mouth, throat, and face: Denies mucositis or sore throat Respiratory: Denies cough, dyspnea or wheezes Cardiovascular: Denies palpitation, chest discomfort or lower extremity swelling (+) swelling of hands with pain and numbness (+) swelling of face  Gastrointestinal:  Denies nausea, heartburn or change in bowel habits Skin: Denies abnormal skin rashes Lymphatics: Denies new lymphadenopathy or easy bruising Neurological:Denies numbness, tingling or new weaknesses Behavioral/Psych: Mood is stable, no new changes  All other systems were reviewed with the patient and are negative.  MEDICAL HISTORY:  Past Medical History:  Diagnosis Date  . Allergic rhinitis   . Anemia, iron deficiency 05/03/2011   2/2 GI AVMs - recieves iron infusions as well as periodic red  blood cell transfusions  . Anxiety   . Arteriovenous malformation of duodenum 02/25/2015  . CARPAL TUNNEL RELEASE, HX OF 07/22/2008  . Colon polyps   . Diabetes mellitus without complication ([place])    type 2  . Difficulty sleeping    takes trazadone for sleep  . Diverticulosis   . GERD (gastroesophageal reflux disease)   . Hepatitis C    in SVR with Harvoni 12/2014-02/2015.   Marland Kitchen History of hernia repair 08/18/2012  . History of porphyria 11/30/2014   Porphyria cutanea tarda in setting of chronic Hepatitis C, frequent  IV iron infusions, and alcohol abuse   . Hypertension   . Peripheral vascular disease ([place])   . Stroke [facility]) 2010   no residual deficits  . Substance use disorder    cocaine    SURGICAL HISTORY: Past Surgical History:  Procedure Laterality Date  . ABDOMINAL HYSTERECTOMY    . APPLICATION OF WOUND VAC N/A 06/21/2014   Procedure: APPLICATION OF WOUND VAC;  Surgeon: Coralie Keens, MD;  Location: [facility];  Service: General;  Laterality: N/A;  . CARPAL TUNNEL RELEASE     rt hand  . COLONOSCOPY WITH PROPOFOL N/A 03/17/2018   Procedure: COLONOSCOPY WITH PROPOFOL;  Surgeon: Doran Stabler, MD;  [facility];  Service: Gastroenterology;  Laterality: N/A;  . ENTEROSCOPY N/A 12/04/2012   Procedure: ENTEROSCOPY;  Surgeon: Beryle Beams, MD;  [facility];  Service: Endoscopy;  Laterality: N/A;  . ENTEROSCOPY N/A 12/18/2012   Procedure: ENTEROSCOPY;  Surgeon: Beryle Beams, MD;  [facility];  Service: Endoscopy;  Laterality: N/A;  . ENTEROSCOPY N/A 02/25/2015  Procedure: ENTEROSCOPY;  Surgeon: Inda Castle, MD;  Location: [place];  Service: Endoscopy;  Laterality: N/A;  . ENTEROSCOPY N/A 10/29/2017   Procedure: ENTEROSCOPY;  Surgeon: Doran Stabler, MD;  [facility];  Service: Gastroenterology;  Laterality: N/A;  . ESOPHAGOGASTRODUODENOSCOPY  05/05/2011   Procedure: ESOPHAGOGASTRODUODENOSCOPY (EGD);  Surgeon: Zenovia Jarred, MD;   [facility];  Service: Gastroenterology;  Laterality: N/A;  Dr. Hilarie Fredrickson will do procedure for Dr. Benson Norway Saturday.  . ESOPHAGOGASTRODUODENOSCOPY  05/08/2011   Procedure: ESOPHAGOGASTRODUODENOSCOPY (EGD);  Surgeon: Beryle Beams;  [facility];  Service: Endoscopy;  Laterality: N/A;  . ESOPHAGOGASTRODUODENOSCOPY  06/07/2011   Procedure: ESOPHAGOGASTRODUODENOSCOPY (EGD);  Surgeon: Beryle Beams, MD;  [facility];  Service: Endoscopy;  Laterality: N/A;  . ESOPHAGOGASTRODUODENOSCOPY  12/20/2011   Procedure: ESOPHAGOGASTRODUODENOSCOPY (EGD);  Surgeon: Beryle Beams, MD;  [facility];  Service: Endoscopy;  Laterality: N/A;  . EYE SURGERY Bilateral    cataracts  . FLEXIBLE SIGMOIDOSCOPY  12/21/2011   Procedure: FLEXIBLE SIGMOIDOSCOPY;  Surgeon: Beryle Beams, MD;  [facility];  Service: Endoscopy;  Laterality: N/A;  . HOT HEMOSTASIS  06/07/2011   Procedure: HOT HEMOSTASIS (ARGON PLASMA COAGULATION/BICAP);  Surgeon: Beryle Beams, MD;  [facility];  Service: Endoscopy;  Laterality: N/A;  . INCISIONAL HERNIA REPAIR N/A 06/01/2014   Procedure: ATTEMPTED LAPAROSCOPIC AND OPEN INCISIONAL HERNIA REPAIR WITH MESH;  Surgeon: Coralie Keens, MD;  Location: [facility];  Service: General;  Laterality: N/A;  . INSERTION OF MESH N/A 06/01/2014   Procedure: INSERTION OF MESH;  Surgeon: Coralie Keens, MD;  Location: [facility];  Service: General;  Laterality: N/A;  . LAPAROTOMY  02/16/2012   Procedure: EXPLORATORY LAPAROTOMY;  Surgeon: Edward Jolly, MD;  [facility];  Service: General;  Laterality: N/A;  oversewing of anastomotic leak and rigid sigmoidoscopy  . LAPAROTOMY N/A 06/21/2014   Procedure: ABDOMINAL WOUND EXPLORATION;  Surgeon: Coralie Keens, MD;  Location: [facility];  Service: General;  Laterality: N/A;  . LEG AMPUTATION ABOVE KNEE     left  . PARTIAL COLECTOMY  02/15/2012   Procedure: PARTIAL COLECTOMY;  Surgeon: Harl Bowie, MD;   [facility];  Service: General;  Laterality: N/A;  . TONSILLECTOMY      I have reviewed the social history and family history with the patient and they are unchanged from previous note.  ALLERGIES:  is allergic to ciprofloxacin; morphine and related; penicillins; codeine; lisinopril; and feraheme [ferumoxytol].  MEDICATIONS:  Current Outpatient Medications  Medication Sig Dispense Refill  . ACCU-CHEK FASTCLIX LANCETS MISC Use to test blood glucose 3 times daily. 100 each 11  . ACCU-CHEK GUIDE test strip CHECK BLOOD SUGAR 3 TIMES A DAY 100 each 11  . allopurinol (ZYLOPRIM) 300 MG tablet TAKE 1 TABLET BY MOUTH ONCE DAILY 30 tablet 5  . aspirin EC 81 MG tablet Take 1 tablet (81 mg total) by mouth daily. 30 tablet 1  . cetirizine (ZYRTEC) 10 MG tablet TAKE 1 TABLET BY MOUTH DAILY 90 tablet 1  . cholecalciferol (VITAMIN D) 1000 units tablet Take 1 tablet (1,000 Units total) by mouth daily. 100 tablet 2  . DILT-XR 180 MG 24 hr capsule TAKE 1 CAPSULE BY MOUTH ONCE DAILY 90 capsule 3  . diphenhydramine-acetaminophen (TYLENOL PM) 25-500 MG TABS tablet Take 1 tablet by mouth at bedtime as needed (sleep).    . feeding supplement, GLUCERNA SHAKE, (GLUCERNA SHAKE) LIQD Take 237 mLs by mouth 3 (three) times daily  between meals. (Patient taking differently: Take 237 mLs by mouth 2 (two) times daily between meals. ) 90 Can 0  . FLUoxetine (PROZAC) 20 MG capsule TAKE 3 CAPSULES BY MOUTH DAILY 90 capsule 5  . gabapentin (NEURONTIN) 400 MG capsule TAKE 2 CAPSULES BY MOUTH THREE TIMES A DAY 180 capsule 4  . Insulin Glargine (LANTUS) 100 UNIT/ML Solostar Pen INJECT 10 UNITS SUBCUTANEOUSLY DAILY AT 10 PM 45 mL 1  . Insulin Pen Needle (UNIFINE PENTIPS) 32G X 4 MM MISC Use to inject insulin once a day. The patient is insulin requiring, ICD 10 code E11.9. The patient injects 1 times per day. 100 each 2  . losartan (COZAAR) 100 MG tablet Take 1 tablet (100 mg total) by mouth daily. 90 tablet 1  . metFORMIN  (GLUCOPHAGE) 1000 MG tablet Take 1 tablet (1,000 mg total) by mouth 2 (two) times daily with a meal. 180 tablet 3  . omeprazole (PRILOSEC) 20 MG capsule TAKE 1 CAPSULE BY MOUTH EVERY DAY 90 capsule 3  . tolnaftate (TINACTIN) 1 % cream Apply 1 application topically 2 (two) times daily. 30 g 0  . traZODone (DESYREL) 50 MG tablet Take 1 tablet (50 mg total) by mouth at bedtime. 30 tablet 5  . vitamin C (ASCORBIC ACID) 500 MG tablet Take 500 mg by mouth every morning.    . furosemide (LASIX) 20 MG tablet Take 1 tablet (20 mg total) by mouth daily. 20 tablet 0  . potassium chloride SA (K-DUR) 20 MEQ tablet Take 1 tablet (20 mEq total) by mouth daily. Take along with lasix 20 tablet 0   No current facility-administered medications for this visit.     PHYSICAL EXAMINATION: ECOG PERFORMANCE STATUS: 2 - Symptomatic, <50% confined to bed  Vitals:   12/10/18 1347  BP: (!) 167/80  Pulse: 89  Resp: 17  Temp: 98.4 F (36.9 C)  SpO2: 97%   Filed Weights   12/10/18 1347  Weight: 198 lb 11.2 oz (90.1 kg)    GENERAL:alert, no distress and comfortable SKIN: skin color, texture, turgor are normal, no rashes or significant lesions EYES: normal, Conjunctiva are pink and non-injected, sclera clear  NECK: supple, thyroid normal size, non-tender, without nodularity LYMPH:  no palpable lymphadenopathy in the cervical, axillary  LUNGS: clear to auscultation and percussion with normal breathing effort HEART: regular rate & rhythm and no murmurs and no lower extremity edema (+) mild Facial swelling ABDOMEN:abdomen soft, non-tender and normal bowel sounds Musculoskeletal:no cyanosis of digits and no clubbing  NEURO: alert & oriented x 3 with fluent speech, no focal motor/sensory deficits  LABORATORY DATA:  I have reviewed the data as listed CBC Latest Ref Rng & Units 12/10/2018 11/10/2018 10/10/2018  WBC 4.0 - 10.5 K/uL 7.4 7.8 6.3  Hemoglobin 12.0 - 15.0 g/dL 12.1 12.4 11.0(L)  Hematocrit 36.0 - 46.0 %  37.4 38.5 34.8(L)  Platelets 150 - 400 K/uL 232 251 299     CMP Latest Ref Rng & Units 07/14/2018 06/18/2018 03/17/2018  Glucose 70 - 99 mg/dL 90 80 137(H)  BUN 8 - 23 mg/dL 17 15 -  Creatinine 0.44 - 1.00 mg/dL 1.04(H) 0.90 -  Sodium 135 - 145 mmol/L 141 138 146(H)  Potassium 3.5 - 5.1 mmol/L 4.6 4.7 4.4  Chloride 98 - 111 mmol/L 108 102 -  CO2 22 - 32 mmol/L 21(L) 18(L) -  Calcium 8.9 - 10.3 mg/dL 8.9 9.7 -  Total Protein 6.5 - 8.1 g/dL 7.3 - -  Total Bilirubin 0.3 -  1.2 mg/dL <0.2(L) - -  Alkaline Phos 38 - 126 U/L 81 - -  AST 15 - 41 U/L 20 - -  ALT 0 - 44 U/L 11 - -      RADIOGRAPHIC STUDIES: I have personally reviewed the radiological images as listed and agreed with the findings in the report. No results found.   ASSESSMENT & PLAN:  [name] is a 68 y.o. female with   1. Iron deficiency anemia secondary to GI blood loss, and Anemia of Chronic Disease  -History of multiple GI bleeding in 2016, required blood transfusion, hospitalization and IV Feraheme -She previously required blood transfusion periodically, much less lately  -She has been receiving IV Feraheme every 1-2 month on average lately. She had a moderate response to IV iron recently.  -She has been on oral iron for 5-6 years now and has had black watery stool.  -She had an EGD and colonoscopy on 10/29/2017 and 03/17/2018 with Dr. Loletha Carrow, which was normal.  -She previously had pushed endoscopy in [facility] and was found to have AVM in small bowel. I discussed if she experience more GI bleeding, I will refer her back to GI at [facility]. -She continues to respond well to IV Feraheme. Goal is to keep her Hg >10 and ferritin>100. Last IV Feraheme on 11/10/18. -Labs reviewed, CBC WNL, Iron panel and ferritin still pending.  -Continue to check her labs every 4 weeks, and will set up iv feraheme with goal ferritin >100. -f/u in 5 months.    2. Porphyria cutaneoustarda (PCT), resolved after Hep C Treatment    3. Chronic hepatitis C infection -She previously tested positive for hepatitis C in 2009. She has previously completed treatment for hepatitis C in 2016 and repeated test was negative.  -She had an EGD and colonoscopy on 10/29/2017 and 03/17/2018 with Dr. Loletha Carrow, which was normal. She will continue to follow up with him.   -05/2018 liver US was unremarkable. She is overdue for AFP.    4. DM2 -Management per PCP's office.  -On Lantus, Metformin -Was hospitalized in March 2018 for high glucose levels greater than 1000.  -Continue to follow up closely with PCP  5. Left BKA, has prosthesis, she is wheelchair bound most of time  6. Smoking cessation  -She is still actively smoking,she isnot ready to quit, however, she knows that she needs to quit  7. Right knee pain -likely secondary to osteoarthritis. She will follow up with PCP   8. Facial and leg edema  -Her LE swelling is intermittent. -She recently has been having Hands swelling with pain and numbness as well as facial swelling.  -She was previously on HCTZ. I will call in Lasix 20mg  with oral potassium for 3-5 days and then use as needed. I strongly encouraged her to follow up with cardiologist soon.     Plan -I called in Lasix and KCL today -Labs reviewed, hg at 12.1 today, ferritin 57, will proceed with iv iron in the next few weeks -Lab monthly  -will set up iv feraheme if ferritin <100 (will give two doses if ferritin<30) -F/u in 5 months  -I recommend her to get BMP when she sees her new PCP next week    No problem-specific Assessment & Plan notes found for this encounter.   No orders of the defined types were placed in this encounter.  All questions were answered. The patient knows to call the clinic with any problems, questions or concerns. No barriers  to learning was detected. I spent 20 minutes counseling the patient face to face. The total time spent in the appointment was 25 minutes and more than 50%  was on counseling and review of test results     Truitt Merle, MD 12/10/2018   I, Joslyn Devon, am acting as scribe for Truitt Merle, MD.   I have reviewed the above documentation for accuracy and completeness, and I agree with the above.

## 2018-12-10 ENCOUNTER — Telehealth: Payer: Self-pay | Admitting: Hematology

## 2018-12-10 ENCOUNTER — Inpatient Hospital Stay: Payer: Medicare Other | Attending: Nurse Practitioner

## 2018-12-10 ENCOUNTER — Encounter: Payer: Self-pay | Admitting: Hematology

## 2018-12-10 ENCOUNTER — Other Ambulatory Visit: Payer: Self-pay

## 2018-12-10 ENCOUNTER — Inpatient Hospital Stay (HOSPITAL_BASED_OUTPATIENT_CLINIC_OR_DEPARTMENT_OTHER): Payer: Medicare Other | Admitting: Hematology

## 2018-12-10 VITALS — BP 167/80 | HR 89 | Temp 98.4°F | Resp 17 | Ht 61.0 in | Wt 198.7 lb

## 2018-12-10 DIAGNOSIS — I739 Peripheral vascular disease, unspecified: Secondary | ICD-10-CM | POA: Insufficient documentation

## 2018-12-10 DIAGNOSIS — D500 Iron deficiency anemia secondary to blood loss (chronic): Secondary | ICD-10-CM | POA: Insufficient documentation

## 2018-12-10 DIAGNOSIS — Z89512 Acquired absence of left leg below knee: Secondary | ICD-10-CM | POA: Insufficient documentation

## 2018-12-10 DIAGNOSIS — I252 Old myocardial infarction: Secondary | ICD-10-CM | POA: Diagnosis not present

## 2018-12-10 DIAGNOSIS — Z79899 Other long term (current) drug therapy: Secondary | ICD-10-CM | POA: Insufficient documentation

## 2018-12-10 DIAGNOSIS — M25561 Pain in right knee: Secondary | ICD-10-CM | POA: Insufficient documentation

## 2018-12-10 DIAGNOSIS — E1165 Type 2 diabetes mellitus with hyperglycemia: Secondary | ICD-10-CM | POA: Diagnosis not present

## 2018-12-10 DIAGNOSIS — R252 Cramp and spasm: Secondary | ICD-10-CM | POA: Insufficient documentation

## 2018-12-10 DIAGNOSIS — Z8673 Personal history of transient ischemic attack (TIA), and cerebral infarction without residual deficits: Secondary | ICD-10-CM

## 2018-12-10 DIAGNOSIS — F419 Anxiety disorder, unspecified: Secondary | ICD-10-CM | POA: Insufficient documentation

## 2018-12-10 DIAGNOSIS — R600 Localized edema: Secondary | ICD-10-CM | POA: Insufficient documentation

## 2018-12-10 DIAGNOSIS — E119 Type 2 diabetes mellitus without complications: Secondary | ICD-10-CM | POA: Insufficient documentation

## 2018-12-10 DIAGNOSIS — Z794 Long term (current) use of insulin: Secondary | ICD-10-CM

## 2018-12-10 DIAGNOSIS — B182 Chronic viral hepatitis C: Secondary | ICD-10-CM | POA: Diagnosis not present

## 2018-12-10 DIAGNOSIS — I10 Essential (primary) hypertension: Secondary | ICD-10-CM | POA: Diagnosis not present

## 2018-12-10 DIAGNOSIS — R200 Anesthesia of skin: Secondary | ICD-10-CM | POA: Insufficient documentation

## 2018-12-10 DIAGNOSIS — Z8619 Personal history of other infectious and parasitic diseases: Secondary | ICD-10-CM

## 2018-12-10 DIAGNOSIS — K219 Gastro-esophageal reflux disease without esophagitis: Secondary | ICD-10-CM | POA: Diagnosis not present

## 2018-12-10 DIAGNOSIS — Z7982 Long term (current) use of aspirin: Secondary | ICD-10-CM | POA: Insufficient documentation

## 2018-12-10 DIAGNOSIS — Z993 Dependence on wheelchair: Secondary | ICD-10-CM

## 2018-12-10 DIAGNOSIS — Z86010 Personal history of colonic polyps: Secondary | ICD-10-CM | POA: Diagnosis not present

## 2018-12-10 LAB — CBC WITH DIFFERENTIAL/PLATELET
Abs Immature Granulocytes: 0.06 10*3/uL (ref 0.00–0.07)
Basophils Absolute: 0 10*3/uL (ref 0.0–0.1)
Basophils Relative: 0 %
Eosinophils Absolute: 0.2 10*3/uL (ref 0.0–0.5)
Eosinophils Relative: 2 %
HCT: 37.4 % (ref 36.0–46.0)
Hemoglobin: 12.1 g/dL (ref 12.0–15.0)
Immature Granulocytes: 1 %
Lymphocytes Relative: 16 %
Lymphs Abs: 1.2 10*3/uL (ref 0.7–4.0)
MCH: 31.6 pg (ref 26.0–34.0)
MCHC: 32.4 g/dL (ref 30.0–36.0)
MCV: 97.7 fL (ref 80.0–100.0)
Monocytes Absolute: 0.8 10*3/uL (ref 0.1–1.0)
Monocytes Relative: 11 %
Neutro Abs: 5.2 10*3/uL (ref 1.7–7.7)
Neutrophils Relative %: 70 %
Platelets: 232 10*3/uL (ref 150–400)
RBC: 3.83 MIL/uL — ABNORMAL LOW (ref 3.87–5.11)
RDW: 14.7 % (ref 11.5–15.5)
WBC: 7.4 10*3/uL (ref 4.0–10.5)
nRBC: 0 % (ref 0.0–0.2)

## 2018-12-10 LAB — IRON AND TIBC
Iron: 56 ug/dL (ref 41–142)
Saturation Ratios: 15 % — ABNORMAL LOW (ref 21–57)
TIBC: 376 ug/dL (ref 236–444)
UIBC: 320 ug/dL (ref 120–384)

## 2018-12-10 LAB — FERRITIN: Ferritin: 57 ng/mL (ref 11–307)

## 2018-12-10 MED ORDER — POTASSIUM CHLORIDE CRYS ER 20 MEQ PO TBCR: 20.0000 meq | EXTENDED_RELEASE_TABLET | Freq: Every day | ORAL | 0 refills | Status: DC

## 2018-12-10 MED ORDER — FUROSEMIDE 20 MG PO TABS: 20.0000 mg | ORAL_TABLET | Freq: Every day | ORAL | 0 refills | Status: DC

## 2018-12-10 MED FILL — POTASSIUM CHLORIDE CRYS ER: 20 | 20 days supply | Qty: 20 | Fill #0

## 2018-12-10 MED FILL — FUROSEMIDE 20 MG TABS: 20 | 20 days supply | Qty: 20 | Fill #0

## 2018-12-10 NOTE — Telephone Encounter (Signed)
Scheduled appt per 7/15 los. ° °Printed calendar and avs. ° ° °

## 2018-12-11 ENCOUNTER — Telehealth: Payer: Self-pay | Admitting: Hematology

## 2018-12-11 NOTE — Telephone Encounter (Signed)
Scheduled appt per 7/15 sch message - pt aware of appt date and time   

## 2018-12-17 ENCOUNTER — Ambulatory Visit
Admission: RE | Admit: 2018-12-17 | Discharge: 2018-12-17 | Disposition: A | Discharge status: Home / Self Care | Payer: Medicare Other | Source: Ambulatory Visit | Attending: Family Medicine | Admitting: Family Medicine

## 2018-12-17 ENCOUNTER — Other Ambulatory Visit: Payer: Self-pay

## 2018-12-17 DIAGNOSIS — Z1231 Encounter for screening mammogram for malignant neoplasm of breast: Secondary | ICD-10-CM | POA: Diagnosis not present

## 2018-12-18 DIAGNOSIS — G933 Postviral fatigue syndrome: Secondary | ICD-10-CM | POA: Diagnosis not present

## 2018-12-19 ENCOUNTER — Other Ambulatory Visit: Payer: Self-pay

## 2018-12-19 ENCOUNTER — Inpatient Hospital Stay: Payer: Medicare Other

## 2018-12-19 VITALS — BP 113/70 | HR 112 | Temp 98.7°F | Resp 16

## 2018-12-19 DIAGNOSIS — R252 Cramp and spasm: Secondary | ICD-10-CM | POA: Diagnosis not present

## 2018-12-19 DIAGNOSIS — E119 Type 2 diabetes mellitus without complications: Secondary | ICD-10-CM | POA: Diagnosis not present

## 2018-12-19 DIAGNOSIS — Z8673 Personal history of transient ischemic attack (TIA), and cerebral infarction without residual deficits: Secondary | ICD-10-CM | POA: Diagnosis not present

## 2018-12-19 DIAGNOSIS — Z86010 Personal history of colonic polyps: Secondary | ICD-10-CM | POA: Diagnosis not present

## 2018-12-19 DIAGNOSIS — Z993 Dependence on wheelchair: Secondary | ICD-10-CM | POA: Diagnosis not present

## 2018-12-19 DIAGNOSIS — Z79899 Other long term (current) drug therapy: Secondary | ICD-10-CM | POA: Diagnosis not present

## 2018-12-19 DIAGNOSIS — K219 Gastro-esophageal reflux disease without esophagitis: Secondary | ICD-10-CM | POA: Diagnosis not present

## 2018-12-19 DIAGNOSIS — I739 Peripheral vascular disease, unspecified: Secondary | ICD-10-CM | POA: Diagnosis not present

## 2018-12-19 DIAGNOSIS — Z89512 Acquired absence of left leg below knee: Secondary | ICD-10-CM | POA: Diagnosis not present

## 2018-12-19 DIAGNOSIS — K922 Gastrointestinal hemorrhage, unspecified: Secondary | ICD-10-CM

## 2018-12-19 DIAGNOSIS — I10 Essential (primary) hypertension: Secondary | ICD-10-CM | POA: Diagnosis not present

## 2018-12-19 DIAGNOSIS — D500 Iron deficiency anemia secondary to blood loss (chronic): Secondary | ICD-10-CM

## 2018-12-19 DIAGNOSIS — R600 Localized edema: Secondary | ICD-10-CM | POA: Diagnosis not present

## 2018-12-19 DIAGNOSIS — I252 Old myocardial infarction: Secondary | ICD-10-CM | POA: Diagnosis not present

## 2018-12-19 DIAGNOSIS — M25561 Pain in right knee: Secondary | ICD-10-CM | POA: Diagnosis not present

## 2018-12-19 DIAGNOSIS — R200 Anesthesia of skin: Secondary | ICD-10-CM | POA: Diagnosis not present

## 2018-12-19 DIAGNOSIS — E1165 Type 2 diabetes mellitus with hyperglycemia: Secondary | ICD-10-CM | POA: Diagnosis not present

## 2018-12-19 DIAGNOSIS — Z7982 Long term (current) use of aspirin: Secondary | ICD-10-CM | POA: Diagnosis not present

## 2018-12-19 DIAGNOSIS — Z794 Long term (current) use of insulin: Secondary | ICD-10-CM | POA: Diagnosis not present

## 2018-12-19 MED ORDER — DIPHENHYDRAMINE HCL 50 MG/ML IJ SOLN
50.0000 mg | Freq: Once | INTRAMUSCULAR | Status: AC
  Administered 2018-12-19: 50 mg via INTRAVENOUS

## 2018-12-19 MED ORDER — DIPHENHYDRAMINE HCL 50 MG/ML IJ SOLN
INTRAMUSCULAR | Status: AC
  Filled 2018-12-19: qty 1

## 2018-12-19 MED ORDER — FAMOTIDINE 20 MG PO TABS
20.0000 mg | ORAL_TABLET | Freq: Once | ORAL | Status: AC
  Administered 2018-12-19: 20 mg via ORAL

## 2018-12-19 MED ORDER — SODIUM CHLORIDE 0.9 % IV SOLN
INTRAVENOUS | Status: DC
  Administered 2018-12-19: 15:00:00 via INTRAVENOUS
  Filled 2018-12-19: qty 250

## 2018-12-19 MED ORDER — SODIUM CHLORIDE 0.9 % IV SOLN
510.0000 mg | Freq: Once | INTRAVENOUS | Status: AC
  Administered 2018-12-19: 510 mg via INTRAVENOUS
  Filled 2018-12-19: qty 510

## 2018-12-19 MED ORDER — FAMOTIDINE 20 MG PO TABS
ORAL_TABLET | ORAL | Status: AC
  Filled 2018-12-19: qty 1

## 2018-12-19 NOTE — Patient Instructions (Signed)

## 2019-01-02 ENCOUNTER — Telehealth: Payer: Self-pay | Admitting: Pharmacist

## 2019-01-02 DIAGNOSIS — Z961 Presence of intraocular lens: Secondary | ICD-10-CM | POA: Diagnosis not present

## 2019-01-02 DIAGNOSIS — H00022 Hordeolum internum right lower eyelid: Secondary | ICD-10-CM | POA: Diagnosis not present

## 2019-01-02 DIAGNOSIS — H01003 Unspecified blepharitis right eye, unspecified eyelid: Secondary | ICD-10-CM | POA: Diagnosis not present

## 2019-01-02 DIAGNOSIS — H04123 Dry eye syndrome of bilateral lacrimal glands: Secondary | ICD-10-CM | POA: Diagnosis not present

## 2019-01-02 DIAGNOSIS — H01006 Unspecified blepharitis left eye, unspecified eyelid: Secondary | ICD-10-CM | POA: Diagnosis not present

## 2019-01-02 MED FILL — DOXYCYCLINE HYCLATE 100 MG: 100 | 7 days supply | Qty: 14 | Fill #0

## 2019-01-06 ENCOUNTER — Other Ambulatory Visit: Payer: Self-pay | Admitting: Dietician

## 2019-01-06 DIAGNOSIS — E119 Type 2 diabetes mellitus without complications: Secondary | ICD-10-CM

## 2019-01-06 DIAGNOSIS — Z794 Long term (current) use of insulin: Secondary | ICD-10-CM

## 2019-01-06 MED ORDER — ACCU-CHEK GUIDE W/DEVICE KIT: 1.0000 | PACK | Freq: Three times a day (TID) | 1 refills | Status: DC

## 2019-01-06 NOTE — Telephone Encounter (Signed)
Diabetes Management Follow Up [name] is a 68 y.o. female who was contacted for DM management. Identity was verified using date of birth.  Diabetes medications: Lantus 10 units QHS and metformin 1,000mg  BID Patient correctly reports DM regimen and adherence  Home BG average: unknown (pt is not checking her BG) Hypoglycemia symptoms: none reported Hyperglycemia symptoms: none reported   A/P Blood glucose control: unknown  Interventions today: - Discussed the hypoglycemic risk of insulin and that we might switch her to another agent such as Victoza or another GLP-1 at her next appt to reduce risk of hypoglycemia and add cardiovascular benefit - Discussed with the pt, how to treat hypoglycemic episodes if they occurr - Pt does not currently have a BG meter but said she will f/u with [name] to see if she can get one so we can better manage and control her diabetes  Advised to contact clinic or seek medical attention if concerns or symptoms arise.  Will notify PCP of findings  The patient verbalized understanding of information provided by repeating back concepts discussed.  Follow-up Pt has f/u appt with PCP on 9/9  Kennon Holter, PharmD PGY1 Ambulatory Care Pharmacy Resident [facility] Phone: [phone]

## 2019-01-06 NOTE — Telephone Encounter (Signed)
[name] calls saying she lost her meter to check blood sugars. Would like a new on sent to [place]. She says she has plenty of supplies.

## 2019-01-06 NOTE — Telephone Encounter (Signed)
Her meter prescription was printed. Can triage call it in please? thank you! If her insurance does not cover it, we can give them a free meter coupon.

## 2019-01-07 MED ORDER — ACCU-CHEK GUIDE W/DEVICE KIT: 1.0000 | PACK | Freq: Three times a day (TID) | 1 refills | Status: DC

## 2019-01-07 MED FILL — DOXYCYCLINE HYCLATE 100 MG: 100 | 30 days supply | Qty: 60 | Fill #0

## 2019-01-07 NOTE — Telephone Encounter (Signed)
[name], I agree with the plan. Thank you!

## 2019-01-07 NOTE — Telephone Encounter (Signed)
CMA sent rx electronically.[name], Patricia Cassady8/12/20209:13 AM

## 2019-01-07 NOTE — Addendum Note (Signed)
Addended by: Marcelino Duster on: 01/07/2019 09:13 AM   Modules accepted: Orders

## 2019-01-08 ENCOUNTER — Other Ambulatory Visit: Payer: Self-pay

## 2019-01-08 DIAGNOSIS — D500 Iron deficiency anemia secondary to blood loss (chronic): Secondary | ICD-10-CM

## 2019-01-09 ENCOUNTER — Inpatient Hospital Stay: Payer: Medicare Other | Attending: Nurse Practitioner

## 2019-01-09 ENCOUNTER — Other Ambulatory Visit: Payer: Self-pay

## 2019-01-09 DIAGNOSIS — D500 Iron deficiency anemia secondary to blood loss (chronic): Secondary | ICD-10-CM

## 2019-01-09 LAB — CBC WITH DIFFERENTIAL (CANCER CENTER ONLY)
Abs Immature Granulocytes: 0.09 10*3/uL — ABNORMAL HIGH (ref 0.00–0.07)
Basophils Absolute: 0 10*3/uL (ref 0.0–0.1)
Basophils Relative: 0 %
Eosinophils Absolute: 0.2 10*3/uL (ref 0.0–0.5)
Eosinophils Relative: 2 %
HCT: 31.5 % — ABNORMAL LOW (ref 36.0–46.0)
Hemoglobin: 10.1 g/dL — ABNORMAL LOW (ref 12.0–15.0)
Immature Granulocytes: 1 %
Lymphocytes Relative: 19 %
Lymphs Abs: 1.8 10*3/uL (ref 0.7–4.0)
MCH: 31.8 pg (ref 26.0–34.0)
MCHC: 32.1 g/dL (ref 30.0–36.0)
MCV: 99.1 fL (ref 80.0–100.0)
Monocytes Absolute: 1 10*3/uL (ref 0.1–1.0)
Monocytes Relative: 11 %
Neutro Abs: 6.5 10*3/uL (ref 1.7–7.7)
Neutrophils Relative %: 67 %
Platelet Count: 282 10*3/uL (ref 150–400)
RBC: 3.18 MIL/uL — ABNORMAL LOW (ref 3.87–5.11)
RDW: 14.7 % (ref 11.5–15.5)
WBC Count: 9.6 10*3/uL (ref 4.0–10.5)
nRBC: 0 % (ref 0.0–0.2)

## 2019-01-09 LAB — COMPREHENSIVE METABOLIC PANEL
ALT: 15 U/L (ref 0–44)
AST: 19 U/L (ref 15–41)
Albumin: 3.9 g/dL (ref 3.5–5.0)
Alkaline Phosphatase: 63 U/L (ref 38–126)
Anion gap: 16 — ABNORMAL HIGH (ref 5–15)
BUN: 31 mg/dL — ABNORMAL HIGH (ref 8–23)
CO2: 13 mmol/L — ABNORMAL LOW (ref 22–32)
Calcium: 9.1 mg/dL (ref 8.9–10.3)
Chloride: 108 mmol/L (ref 98–111)
Creatinine, Ser: 1.61 mg/dL — ABNORMAL HIGH (ref 0.44–1.00)
GFR calc Af Amer: 38 mL/min — ABNORMAL LOW (ref >60)
GFR calc non Af Amer: 33 mL/min — ABNORMAL LOW (ref >60)
Glucose, Bld: 73 mg/dL (ref 70–99)
Potassium: 4.7 mmol/L (ref 3.5–5.1)
Sodium: 137 mmol/L (ref 135–145)
Total Bilirubin: <0.2 mg/dL — ABNORMAL LOW (ref 0.3–1.2)
Total Protein: 7.1 g/dL (ref 6.5–8.1)

## 2019-01-12 LAB — IRON AND TIBC
Iron: 100 ug/dL (ref 41–142)
Saturation Ratios: 33 % (ref 21–57)
TIBC: 300 ug/dL (ref 236–444)
UIBC: 200 ug/dL (ref 120–384)

## 2019-01-12 LAB — FERRITIN: Ferritin: 122 ng/mL (ref 11–307)

## 2019-01-15 ENCOUNTER — Telehealth: Payer: Self-pay

## 2019-01-15 DIAGNOSIS — G933 Postviral fatigue syndrome: Secondary | ICD-10-CM | POA: Diagnosis not present

## 2019-01-15 NOTE — Telephone Encounter (Signed)
Spoke with patient regarding lab results, per Dr. Burr Medico mild anemia again, no need for IV iron for now, instructed her to watch for signs of bleeding (I.e black tarry stools).  She verbalized an understanding.

## 2019-01-15 NOTE — Telephone Encounter (Signed)
-----   Message from Truitt Merle, MD sent at 01/14/2019 11:33 PM EDT ----- Please let pt know her lab results, she has mild anemia again, but iron level good, no need iv iron for now, watch for signs of bleeding, call us if needed, and please add a lab appointment in 2 weeks, thanks   Truitt Merle  01/14/2019

## 2019-01-22 DIAGNOSIS — Z961 Presence of intraocular lens: Secondary | ICD-10-CM | POA: Diagnosis not present

## 2019-01-22 DIAGNOSIS — H04123 Dry eye syndrome of bilateral lacrimal glands: Secondary | ICD-10-CM | POA: Diagnosis not present

## 2019-01-22 DIAGNOSIS — E119 Type 2 diabetes mellitus without complications: Secondary | ICD-10-CM | POA: Diagnosis not present

## 2019-01-22 DIAGNOSIS — H1045 Other chronic allergic conjunctivitis: Secondary | ICD-10-CM | POA: Diagnosis not present

## 2019-01-22 LAB — HM DIABETES EYE EXAM

## 2019-01-23 DIAGNOSIS — H1013 Acute atopic conjunctivitis, bilateral: Secondary | ICD-10-CM | POA: Diagnosis not present

## 2019-01-26 ENCOUNTER — Encounter: Payer: Self-pay | Admitting: *Deleted

## 2019-01-26 DIAGNOSIS — H1045 Other chronic allergic conjunctivitis: Secondary | ICD-10-CM | POA: Diagnosis not present

## 2019-01-26 DIAGNOSIS — H1013 Acute atopic conjunctivitis, bilateral: Secondary | ICD-10-CM | POA: Diagnosis not present

## 2019-01-26 DIAGNOSIS — H04123 Dry eye syndrome of bilateral lacrimal glands: Secondary | ICD-10-CM | POA: Diagnosis not present

## 2019-01-30 ENCOUNTER — Other Ambulatory Visit: Payer: Self-pay | Admitting: *Deleted

## 2019-01-30 NOTE — Telephone Encounter (Signed)
Next appt scheduled 02/04/19 with PCP.

## 2019-02-03 ENCOUNTER — Telehealth: Payer: Self-pay | Admitting: Pharmacist

## 2019-02-03 DIAGNOSIS — Z794 Long term (current) use of insulin: Secondary | ICD-10-CM

## 2019-02-03 DIAGNOSIS — E119 Type 2 diabetes mellitus without complications: Secondary | ICD-10-CM

## 2019-02-03 MED ORDER — TRULICITY 0.75 MG/0.5ML ~~LOC~~ SOAJ: 0.7500 mg | SUBCUTANEOUS | 0 refills | Status: DC

## 2019-02-03 MED ORDER — UNIFINE PENTIPS 32G X 4 MM MISC: 2 refills | Status: DC

## 2019-02-03 MED FILL — TRULICITY 0.75 MG/0.5 ML PE: 0.75 | 28 days supply | Qty: 2 | Fill #0

## 2019-02-03 NOTE — Telephone Encounter (Signed)
Diabetes Management Follow Up [name] is a 68 y.o. female who was contacted for DM management. Identity was verified using date of birth.  Diabetes medications: Lantus 10 units QHS and metformin 1,000mg  BID Patient correctly reports DM regimen and adherence  Home BG average: unknown (pt is not checking her BG) Hypoglycemia symptoms: none reported Hyperglycemia symptoms:none reported *patient did report "blacking out" and falling asleep. She was not sure whether this was related to hypoglycemia or not. I suspect it is likely due to hypoglycemia.  -Discussed GLP-1 and SGLT2 options at length and patient said she liked the sound of GLP-1 agonists better and would prefer a once weekly regimen. She would like to start on Sunday 9/13.  A/P Blood glucose control: stable  Interventions today: -Discontinue Lantus 10 units on 9/12 after PM dose -Initiate Trulicity 0.75mg  once weekly every Sunday starting 9/13 -Follow up by A1c and CBGs  -Plan to titrate to 1.5mg  weekly after 4 weeks if pt tolerates -Pt will check her CBGs 1-2 times per week when she gets a new meter (has not been checking for months now) -Counseled the pt on possible side effects of Trulicity including nausea, vomiting, diarrhea  Prescription for Trulicity sent to [facility] to contact clinic or seek medical attention if concerns or symptoms arise.  Will notify PCP of findings  The patient verbalized understanding of information provided by repeating back concepts discussed.  Follow-up F/u w Dr. Charleen Kirks on 9/9  Kennon Holter, PharmD PGY1 Ambulatory Care Pharmacy Resident [facility] Phone: [phone]

## 2019-02-04 ENCOUNTER — Ambulatory Visit (INDEPENDENT_AMBULATORY_CARE_PROVIDER_SITE_OTHER): Payer: Medicare Other | Admitting: Internal Medicine

## 2019-02-04 ENCOUNTER — Encounter: Payer: Self-pay | Admitting: Internal Medicine

## 2019-02-04 ENCOUNTER — Other Ambulatory Visit: Payer: Self-pay

## 2019-02-04 VITALS — BP 110/68 | HR 123 | Wt 190.2 lb

## 2019-02-04 DIAGNOSIS — E119 Type 2 diabetes mellitus without complications: Secondary | ICD-10-CM

## 2019-02-04 DIAGNOSIS — M25531 Pain in right wrist: Secondary | ICD-10-CM

## 2019-02-04 DIAGNOSIS — Z89612 Acquired absence of left leg above knee: Secondary | ICD-10-CM

## 2019-02-04 DIAGNOSIS — Z79899 Other long term (current) drug therapy: Secondary | ICD-10-CM

## 2019-02-04 DIAGNOSIS — E1122 Type 2 diabetes mellitus with diabetic chronic kidney disease: Secondary | ICD-10-CM | POA: Diagnosis not present

## 2019-02-04 DIAGNOSIS — Z0000 Encounter for general adult medical examination without abnormal findings: Secondary | ICD-10-CM

## 2019-02-04 DIAGNOSIS — Z7289 Other problems related to lifestyle: Secondary | ICD-10-CM

## 2019-02-04 DIAGNOSIS — D500 Iron deficiency anemia secondary to blood loss (chronic): Secondary | ICD-10-CM

## 2019-02-04 DIAGNOSIS — G479 Sleep disorder, unspecified: Secondary | ICD-10-CM

## 2019-02-04 DIAGNOSIS — Z23 Encounter for immunization: Secondary | ICD-10-CM

## 2019-02-04 DIAGNOSIS — E1151 Type 2 diabetes mellitus with diabetic peripheral angiopathy without gangrene: Secondary | ICD-10-CM

## 2019-02-04 DIAGNOSIS — N189 Chronic kidney disease, unspecified: Secondary | ICD-10-CM

## 2019-02-04 DIAGNOSIS — N289 Disorder of kidney and ureter, unspecified: Secondary | ICD-10-CM | POA: Insufficient documentation

## 2019-02-04 DIAGNOSIS — S78112A Complete traumatic amputation at level between left hip and knee, initial encounter: Secondary | ICD-10-CM

## 2019-02-04 DIAGNOSIS — Z794 Long term (current) use of insulin: Secondary | ICD-10-CM | POA: Diagnosis not present

## 2019-02-04 DIAGNOSIS — Z8619 Personal history of other infectious and parasitic diseases: Secondary | ICD-10-CM

## 2019-02-04 DIAGNOSIS — Z789 Other specified health status: Secondary | ICD-10-CM

## 2019-02-04 DIAGNOSIS — G546 Phantom limb syndrome with pain: Secondary | ICD-10-CM | POA: Diagnosis not present

## 2019-02-04 DIAGNOSIS — Z9714 Presence of artificial left leg (complete) (partial): Secondary | ICD-10-CM

## 2019-02-04 DIAGNOSIS — F1010 Alcohol abuse, uncomplicated: Secondary | ICD-10-CM

## 2019-02-04 HISTORY — DX: Disorder of kidney and ureter, unspecified: N28.9

## 2019-02-04 LAB — POCT GLYCOSYLATED HEMOGLOBIN (HGB A1C): Hemoglobin A1C: 5 % (ref 4.0–5.6)

## 2019-02-04 LAB — GLUCOSE, CAPILLARY: Glucose-Capillary: 107 mg/dL — ABNORMAL HIGH (ref 70–99)

## 2019-02-04 MED ORDER — TRAZODONE HCL 50 MG PO TABS: 100.0000 mg | ORAL_TABLET | Freq: Every day | ORAL | 5 refills | Status: DC

## 2019-02-04 MED ORDER — GABAPENTIN 400 MG PO CAPS: 400.0000 mg | ORAL_CAPSULE | Freq: Three times a day (TID) | ORAL | 3 refills | Status: DC

## 2019-02-04 NOTE — Assessment & Plan Note (Signed)
Patient's above-knee amputation has led to significantly decreased exercise and ambulation.  She is now resorting to exercise bands to ensure mobility in her lower extremities.  Ms. Provence has received a new prosthesis recently that will allow her to have better mobility but does require the assistance of a Rollator.  This will be very important to help improve her weight loss in addition to maintain muscle tone and prevent future DVT.  PLAN:  - DME Rollator order placed for [facility]

## 2019-02-04 NOTE — Assessment & Plan Note (Signed)
[name] continues to have difficulty staying asleep. She is currently taking Trazodone 50mg  and this adequately controlled her symptoms at the beginning.   PLAN: - Increase Trazodone 100mg  at bedtime daily

## 2019-02-04 NOTE — Patient Instructions (Addendum)
It was nice seeing you today! Thank you for choosing [facility] for your Primary Care.    Today we talked about:   - Increase water intake - Decrease salt  - Decrease alcohol   Saturday: Take your last dose of Lantus Sunday: Start Trulicity  Gabapentin: Decrease to 1 tablet, 3 times per day  Trazodone: Take 2 tablets before bedtime.   -------------------------------------------------------------------------------------  DASH Eating Plan DASH stands for "Dietary Approaches to Stop Hypertension." The DASH eating plan is a healthy eating plan that has been shown to reduce high blood pressure (hypertension). It may also reduce your risk for type 2 diabetes, heart disease, and stroke. The DASH eating plan may also help with weight loss. What are tips for following this plan?  General guidelines  Avoid eating more than 2,300 mg (milligrams) of salt (sodium) a day. If you have hypertension, you may need to reduce your sodium intake to 1,500 mg a day.  Limit alcohol intake to no more than 1 drink a day for nonpregnant women and 2 drinks a day for men. One drink equals 12 oz of beer, 5 oz of wine, or 1 oz of hard liquor.  Work with your health care provider to maintain a healthy body weight or to lose weight. Ask what an ideal weight is for you.  Get at least 30 minutes of exercise that causes your heart to beat faster (aerobic exercise) most days of the week. Activities may include walking, swimming, or biking.  Work with your health care provider or diet and nutrition specialist (dietitian) to adjust your eating plan to your individual calorie needs. Reading food labels   Check food labels for the amount of sodium per serving. Choose foods with less than 5 percent of the Daily Value of sodium. Generally, foods with less than 300 mg of sodium per serving fit into this eating plan.  To find whole grains, look for the word "whole" as the first word in the ingredient list.  Shopping  Buy products labeled as "low-sodium" or "no salt added."  Buy fresh foods. Avoid canned foods and premade or frozen meals. Cooking  Avoid adding salt when cooking. Use salt-free seasonings or herbs instead of table salt or sea salt. Check with your health care provider or pharmacist before using salt substitutes.  Do not fry foods. Cook foods using healthy methods such as baking, boiling, grilling, and broiling instead.  Cook with heart-healthy oils, such as olive, canola, soybean, or sunflower oil. Meal planning  Eat a balanced diet that includes: ? 5 or more servings of fruits and vegetables each day. At each meal, try to fill half of your plate with fruits and vegetables. ? Up to 6-8 servings of whole grains each day. ? Less than 6 oz of lean meat, poultry, or fish each day. A 3-oz serving of meat is about the same size as a deck of cards. One egg equals 1 oz. ? 2 servings of low-fat dairy each day. ? A serving of nuts, seeds, or beans 5 times each week. ? Heart-healthy fats. Healthy fats called Omega-3 fatty acids are found in foods such as flaxseeds and coldwater fish, like sardines, salmon, and mackerel.  Limit how much you eat of the following: ? Canned or prepackaged foods. ? Food that is high in trans fat, such as fried foods. ? Food that is high in saturated fat, such as fatty meat. ? Sweets, desserts, sugary drinks, and other foods with added sugar. ? Full-fat  dairy products.  Do not salt foods before eating.  Try to eat at least 2 vegetarian meals each week.  Eat more home-cooked food and less restaurant, buffet, and fast food.  When eating at a restaurant, ask that your food be prepared with less salt or no salt, if possible. What foods are recommended? The items listed may not be a complete list. Talk with your dietitian about what dietary choices are best for you. Grains Whole-grain or whole-wheat bread. Whole-grain or whole-wheat pasta. Brown rice.  [place]. Bulgur. Whole-grain and low-sodium cereals. Pita bread. Low-fat, low-sodium crackers. Whole-wheat flour tortillas. Vegetables Fresh or frozen vegetables (raw, steamed, roasted, or grilled). Low-sodium or reduced-sodium tomato and vegetable juice. Low-sodium or reduced-sodium tomato sauce and tomato paste. Low-sodium or reduced-sodium canned vegetables. Fruits All fresh, dried, or frozen fruit. Canned fruit in natural juice (without added sugar). Meat and other protein foods Skinless chicken or [place]. Ground chicken or [place]. Pork with fat trimmed off. Fish and seafood. Egg whites. Dried beans, peas, or lentils. Unsalted nuts, nut butters, and seeds. Unsalted canned beans. Lean cuts of beef with fat trimmed off. Low-sodium, lean deli meat. Dairy Low-fat (1%) or fat-free (skim) milk. Fat-free, low-fat, or reduced-fat cheeses. Nonfat, low-sodium ricotta or cottage cheese. Low-fat or nonfat yogurt. Low-fat, low-sodium cheese. Fats and oils Soft margarine without trans fats. Vegetable oil. Low-fat, reduced-fat, or light mayonnaise and salad dressings (reduced-sodium). Canola, safflower, olive, soybean, and sunflower oils. Avocado. Seasoning and other foods Herbs. Spices. Seasoning mixes without salt. Unsalted popcorn and pretzels. Fat-free sweets. What foods are not recommended? The items listed may not be a complete list. Talk with your dietitian about what dietary choices are best for you. Grains Baked goods made with fat, such as croissants, muffins, or some breads. Dry pasta or rice meal packs. Vegetables Creamed or fried vegetables. Vegetables in a cheese sauce. Regular canned vegetables (not low-sodium or reduced-sodium). Regular canned tomato sauce and paste (not low-sodium or reduced-sodium). Regular tomato and vegetable juice (not low-sodium or reduced-sodium). Angie Fava. Olives. Fruits Canned fruit in a light or heavy syrup. Fried fruit. Fruit in cream or butter sauce. Meat  and other protein foods Fatty cuts of meat. Ribs. Fried meat. Berniece Salines. Sausage. Bologna and other processed lunch meats. Salami. Fatback. Hotdogs. Bratwurst. Salted nuts and seeds. Canned beans with added salt. Canned or smoked fish. Whole eggs or egg yolks. Chicken or [place] with skin. Dairy Whole or 2% milk, cream, and half-and-half. Whole or full-fat cream cheese. Whole-fat or sweetened yogurt. Full-fat cheese. Nondairy creamers. Whipped toppings. Processed cheese and cheese spreads. Fats and oils Butter. Stick margarine. Lard. Shortening. Ghee. Bacon fat. Tropical oils, such as coconut, palm kernel, or palm oil. Seasoning and other foods Salted popcorn and pretzels. Onion salt, garlic salt, seasoned salt, table salt, and sea salt. Worcestershire sauce. Tartar sauce. Barbecue sauce. Teriyaki sauce. Soy sauce, including reduced-sodium. Steak sauce. Canned and packaged gravies. Fish sauce. Oyster sauce. Cocktail sauce. Horseradish that you find on the shelf. Ketchup. Mustard. Meat flavorings and tenderizers. Bouillon cubes. Hot sauce and Tabasco sauce. Premade or packaged marinades. Premade or packaged taco seasonings. Relishes. Regular salad dressings. Where to find more information:  National Heart, Lung, and [place]: https://wilson-eaton.com/  American Heart Association: www.heart.org Summary  The DASH eating plan is a healthy eating plan that has been shown to reduce high blood pressure (hypertension). It may also reduce your risk for type 2 diabetes, heart disease, and stroke.  With the DASH eating plan, you should  limit salt (sodium) intake to 2,300 mg a day. If you have hypertension, you may need to reduce your sodium intake to 1,500 mg a day.  When on the DASH eating plan, aim to eat more fresh fruits and vegetables, whole grains, lean proteins, low-fat dairy, and heart-healthy fats.  Work with your health care provider or diet and nutrition specialist (dietitian) to adjust your eating  plan to your individual calorie needs. This information is not intended to replace advice given to you by your health care provider. Make sure you discuss any questions you have with your health care provider. Document Released: 05/03/2011 Document Revised: 04/26/2017 Document Reviewed: 05/07/2016 Elsevier Patient Education  2020 [employer].

## 2019-02-04 NOTE — Assessment & Plan Note (Signed)
Patient endorses approximately 6 shots of liquor per day. She knows this is too much. We discussed that alcohol cannot only worsen her kidney function, but worsen her liver function as well. She feels this is a good motivator to decrease use. We discussed the plan to wean alcohol use slowly buy decreasing from 6 shots to 5 and down.   PLAN:  - Re-assess how she is doing with this in 1 month

## 2019-02-04 NOTE — Progress Notes (Signed)
CC: Diabetes follow up  HPI:  [name] is a 68 y.o. with a PMHx of type 2 diabetes on Lantus, iron deficiency anemia due to GI blood loss well-controlled with iron infusions, treated chronic hepatitis C, peripheral vascular disease, substance abuse, who presents to the clinic for follow-up diabetes.  [name] states that she has been doing well lately.  She has been working hard on staying positive and living life to the fullest every day.  Her only acute complaint today is right wrist pain that began approximately 4 days ago.  It started after she lifted a heavy pot and she dropped it after sudden onset of pain.  No trauma to the area.  She has noticed since then that she had a little bump that came up but is soft and tender to palpation.  She is never had anything like this before.  Range of motion is affected by pain level.  She has not tried to take anything for alleviation.  No erythema, bruising, lesions, rash.  [name] also notes that when the weather is bad, she takes extra gabapentin at night.  Due to this, she has been running out of gabapentin early.  She notes that she has been taking gabapentin for approximately 11 years and it has helped significantly with her peripheral neuropathy. She reports that no other medication in the past has helped with this.  With the new prosthetic that she recently received, she has been able to walk around her house really well with her neighbors broken Rollator.  She feels increased exercise will help her lose weight and stay conditioned.  She is interested in having her own Rollator so she does not have to borrow anyone's anymore.  Continues to have difficulty staying asleep, and notes waking up several times a night.  This has not changed significantly in the past, but notes that trazodone used to better control this.  Denies difficulty urinating, but does note occasional decreased urine output, especially when she eats an entire  bag of salty popcorn.  Past Medical History:  Diagnosis Date  . Allergic rhinitis   . Anemia, iron deficiency 05/03/2011   2/2 GI AVMs - recieves iron infusions as well as periodic red blood cell transfusions  . Anxiety   . Arteriovenous malformation of duodenum 02/25/2015  . CARPAL TUNNEL RELEASE, HX OF 07/22/2008  . Colon polyps   . Diabetes mellitus without complication ([place])    type 2  . Difficulty sleeping    takes trazadone for sleep  . Diverticulosis   . GERD (gastroesophageal reflux disease)   . Hepatitis C    in SVR with Harvoni 12/2014-02/2015.   Marland Kitchen History of hernia repair 08/18/2012  . History of porphyria 11/30/2014   Porphyria cutanea tarda in setting of chronic Hepatitis C, frequent  IV iron infusions, and alcohol abuse   . Hypertension   . Peripheral vascular disease ([place])   . Stroke [facility]) 2010   no residual deficits  . Substance use disorder    cocaine   Review of Systems:   Review of Systems  Constitutional: Negative for chills, fever and weight loss.  Respiratory: Negative for shortness of breath.   Cardiovascular: Positive for leg swelling (intermittently).  Gastrointestinal: Negative for abdominal pain, nausea and vomiting.  Genitourinary: Negative for frequency and urgency.  Musculoskeletal: Positive for joint pain (right wrist pain) and myalgias (left thigh pain).       AKA with prosthesis in place  Psychiatric/Behavioral: Positive for  substance abuse (6 shots of alcohol per day). Negative for depression and suicidal ideas.   Physical Exam:  Vitals:   02/04/19 1435  BP: 110/68  Pulse: (!) 123  SpO2: 99%  Weight: 190 lb 3.2 oz (86.3 kg)   Physical Exam Vitals signs and nursing note reviewed.  Constitutional:      Appearance: She is obese.  Pulmonary:     Effort: Pulmonary effort is normal. No respiratory distress.  Musculoskeletal:     Right wrist: She exhibits decreased range of motion (secondary to pain), tenderness (tenderness to palpation in  the center of the anterior and posterior aspect) and deformity (Circular, 2cm elevated bump that is fully compressible on the posterior aspect of the wrist . No discoloration, brusing or lesions.).     Left wrist: Normal.     Right lower leg: No edema.     Left lower leg: No edema.  Skin:    General: Skin is warm and dry.  Neurological:     Mental Status: She is alert and oriented to person, place, and time. Mental status is at baseline.  Psychiatric:        Mood and Affect: Mood normal.        Behavior: Behavior normal.    Assessment & Plan:   See Encounters Tab for problem based charting.  Patient seen with Dr. Philipp Ovens

## 2019-02-04 NOTE — Assessment & Plan Note (Signed)
Pharmacy resident, Gerald Stabs, has been working closely with patient to switch from Lantus to Trulicity.  She will benefit from the cardiac and renal protection.  Patient is in agreement with this plan.  All questions and concerns were addressed.  We will follow-up in 1 month to see how she is tolerating her new medication.  A1c was rechecked today and shows improvement to 5.0%.  BMP to assess for renal function is still pending.    PLAN: Pharmacy instructions   - 9/12: Stop Lantus  - [id number]: Start Trulicity  - BMP pending -1 month follow-up

## 2019-02-04 NOTE — Assessment & Plan Note (Signed)
[name] has had a fluctuating GFR for the past 2-3 years, that have occasionally returned to normal however quickly dipped again.  She was not aware of any previous acute kidney injury.  Per chart review, acute kidney injury occurred during HHS episode.  At that time, her A1c was found to be 15.5%.  It seemed that her GFR improved after this, however in 2019, it had started to decrease again.  At this time, she is taking gabapentin 800 mg 3 times daily which is more than twice the dose recommended for renally dosed gabapentin.  We discussed this with the patient and she was very upset.  Overall she agrees to decrease gabapentin and follow her kidney function closely.  We discussed the importance of maintaining fluid intake especially with alcohol intake.  We also discussed the importance of decreasing salt intake.  She is already not taking NSAIDs due to previous GI bleeds.  PLAN: -BMP pending -Decreased dose of gabapentin to 400 mg 3 times daily.  In the next month, will decrease to 300 mg 3 times daily -Follow-up in 1 month

## 2019-02-04 NOTE — Assessment & Plan Note (Signed)
Lipid panel pending. Received flu and Prenvar vaccinations today.

## 2019-02-04 NOTE — Assessment & Plan Note (Signed)
>>  ASSESSMENT AND PLAN FOR TYPE 2 DIABETES MELLITUS WITH DIABETIC NEUROPATHY ([facility]) WRITTEN ON 02/04/2019  5:04 PM BY ARNETT SAUNDERS, MD  Pharmacy resident, [place], has been working closely with patient to switch from Lantus  to Trulicity .  She will benefit from the cardiac and renal protection.  Patient is in agreement with this plan.  All questions and concerns were addressed.  We will follow-up in 1 month to see how she is tolerating her new medication.  A1c was rechecked today and shows improvement to 5.0%.  BMP to assess for renal function is still pending.    PLAN: Pharmacy instructions   - 9/12: Stop Lantus   - 9/13: Start Trulicity   - BMP pending -1 month follow-up

## 2019-02-05 ENCOUNTER — Telehealth: Payer: Self-pay | Admitting: *Deleted

## 2019-02-05 LAB — LIPID PANEL
Chol/HDL Ratio: 3.5 ratio (ref 0.0–4.4)
Cholesterol, Total: 159 mg/dL (ref 100–199)
HDL: 46 mg/dL (ref >39)
LDL Chol Calc (NIH): 64 mg/dL (ref 0–99)
Triglycerides: 314 mg/dL — ABNORMAL HIGH (ref 0–149)
VLDL Cholesterol Cal: 49 mg/dL — ABNORMAL HIGH (ref 5–40)

## 2019-02-05 LAB — BMP8+ANION GAP
Anion Gap: 19 mmol/L — ABNORMAL HIGH (ref 10.0–18.0)
BUN/Creatinine Ratio: 25 (ref 12–28)
BUN: 45 mg/dL — ABNORMAL HIGH (ref 8–27)
CO2: 22 mmol/L (ref 20–29)
Calcium: 9.7 mg/dL (ref 8.7–10.3)
Chloride: 93 mmol/L — ABNORMAL LOW (ref 96–106)
Creatinine, Ser: 1.78 mg/dL — ABNORMAL HIGH (ref 0.57–1.00)
GFR calc Af Amer: 34 mL/min/{1.73_m2} — ABNORMAL LOW (ref >59)
GFR calc non Af Amer: 29 mL/min/{1.73_m2} — ABNORMAL LOW (ref >59)
Glucose: 100 mg/dL — ABNORMAL HIGH (ref 65–99)
Potassium: 6 mmol/L — ABNORMAL HIGH (ref 3.5–5.2)
Sodium: 134 mmol/L (ref 134–144)

## 2019-02-05 MED ORDER — ACCU-CHEK GUIDE W/DEVICE KIT: 1.0000 | PACK | Freq: Three times a day (TID) | 1 refills | Status: AC

## 2019-02-05 NOTE — Progress Notes (Signed)
Internal Medicine Clinic Attending  I saw and evaluated the patient.  I personally confirmed the key portions of the history and exam documented by Dr. Charleen Kirks and I reviewed pertinent patient test results.  The assessment, diagnosis, and plan were formulated together and I agree with the documentation in the resident's note with the following additions.   Patient was upset today during her visit after discussion of her CKD and renal function. Claims that no one has mentioned her kidney disease before and she was very upset by this. She had multiple siblings and her mom die on dialysis. 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 She has been taking high doses of gabapentin for over 10 years for her phantom leg pain. Agreeable to decreasing this for renal dosing. She is also on metformin 1000 mg BID. With most recent renal function and GFR, this will need to be decreased and likely discontinued in the near future . Advised avoiding NSAIDs. Consider nephrology referral given her significant anxiety surrounding this issue.

## 2019-02-05 NOTE — Addendum Note (Signed)
Addended by: Forde Dandy on: 02/05/2019 04:30 PM   Modules accepted: Orders

## 2019-02-05 NOTE — Telephone Encounter (Signed)
Jolee Ewing at [facility] notified via community message that order for rolator has been placed. Hubbard Hartshorn, RN, BSN

## 2019-02-06 ENCOUNTER — Other Ambulatory Visit: Payer: Self-pay

## 2019-02-06 ENCOUNTER — Inpatient Hospital Stay: Payer: Medicare Other | Attending: Nurse Practitioner

## 2019-02-06 DIAGNOSIS — Z89612 Acquired absence of left leg above knee: Secondary | ICD-10-CM | POA: Diagnosis not present

## 2019-02-06 DIAGNOSIS — D500 Iron deficiency anemia secondary to blood loss (chronic): Secondary | ICD-10-CM | POA: Diagnosis not present

## 2019-02-06 DIAGNOSIS — S78119A Complete traumatic amputation at level between unspecified hip and knee, initial encounter: Secondary | ICD-10-CM | POA: Diagnosis not present

## 2019-02-06 DIAGNOSIS — Z79899 Other long term (current) drug therapy: Secondary | ICD-10-CM | POA: Diagnosis not present

## 2019-02-06 DIAGNOSIS — S31102A Unspecified open wound of abdominal wall, epigastric region without penetration into peritoneal cavity, initial encounter: Secondary | ICD-10-CM | POA: Diagnosis not present

## 2019-02-06 LAB — CBC WITH DIFFERENTIAL (CANCER CENTER ONLY)
Abs Immature Granulocytes: 0.16 10*3/uL — ABNORMAL HIGH (ref 0.00–0.07)
Basophils Absolute: 0.1 10*3/uL (ref 0.0–0.1)
Basophils Relative: 1 %
Eosinophils Absolute: 0.1 10*3/uL (ref 0.0–0.5)
Eosinophils Relative: 2 %
HCT: 31.4 % — ABNORMAL LOW (ref 36.0–46.0)
Hemoglobin: 9.9 g/dL — ABNORMAL LOW (ref 12.0–15.0)
Immature Granulocytes: 2 %
Lymphocytes Relative: 15 %
Lymphs Abs: 1.2 10*3/uL (ref 0.7–4.0)
MCH: 31.4 pg (ref 26.0–34.0)
MCHC: 31.5 g/dL (ref 30.0–36.0)
MCV: 99.7 fL (ref 80.0–100.0)
Monocytes Absolute: 1 10*3/uL (ref 0.1–1.0)
Monocytes Relative: 12 %
Neutro Abs: 5.5 10*3/uL (ref 1.7–7.7)
Neutrophils Relative %: 68 %
Platelet Count: 377 10*3/uL (ref 150–400)
RBC: 3.15 MIL/uL — ABNORMAL LOW (ref 3.87–5.11)
RDW: 14.4 % (ref 11.5–15.5)
WBC Count: 8.1 10*3/uL (ref 4.0–10.5)
nRBC: 0 % (ref 0.0–0.2)

## 2019-02-07 ENCOUNTER — Other Ambulatory Visit: Payer: Self-pay | Admitting: Hematology

## 2019-02-07 DIAGNOSIS — D500 Iron deficiency anemia secondary to blood loss (chronic): Secondary | ICD-10-CM

## 2019-02-09 ENCOUNTER — Telehealth: Payer: Self-pay | Admitting: Nurse Practitioner

## 2019-02-09 ENCOUNTER — Telehealth: Payer: Self-pay

## 2019-02-09 ENCOUNTER — Inpatient Hospital Stay: Payer: Medicare Other

## 2019-02-09 LAB — FERRITIN: Ferritin: 24 ng/mL (ref 11–307)

## 2019-02-09 LAB — IRON AND TIBC
Iron: 47 ug/dL (ref 41–142)
Saturation Ratios: 11 % — ABNORMAL LOW (ref 21–57)
TIBC: 424 ug/dL (ref 236–444)
UIBC: 376 ug/dL (ref 120–384)

## 2019-02-09 NOTE — Telephone Encounter (Signed)
Spoke with patient to let her know that ferritin is 24.  Dr. Burr Medico is recommending IV Feraheme weekly for 2 weeks. Scheduling will call her with date/times.  She verbalized an understanding.

## 2019-02-09 NOTE — Telephone Encounter (Signed)
-----   Message from Alla Feeling, NP sent at 02/09/2019 10:36 AM EDT ----- Please let her know ferritin is 24. I recommend IV Feraheme x2, once this week and next. I will send schedule message.  Thanks, Regan Rakers

## 2019-02-09 NOTE — Telephone Encounter (Signed)
Scheduled appt per 9/14 sch message - pt aware of appt date and time  

## 2019-02-10 NOTE — Telephone Encounter (Signed)
From: Mariann Barter Sent: 02/10/2019  10:06 AM EDT To: Darlina Guys, Mariann Barter, * Subject: RE: Rolator                                    She requested that it be shipped to her so we have done so.

## 2019-02-12 ENCOUNTER — Other Ambulatory Visit: Payer: Self-pay | Admitting: Internal Medicine

## 2019-02-12 ENCOUNTER — Telehealth: Payer: Self-pay

## 2019-02-12 DIAGNOSIS — Z794 Long term (current) use of insulin: Secondary | ICD-10-CM

## 2019-02-12 DIAGNOSIS — E119 Type 2 diabetes mellitus without complications: Secondary | ICD-10-CM

## 2019-02-12 NOTE — Telephone Encounter (Signed)
Next appt scheduled 10/21 with PCP.

## 2019-02-12 NOTE — Telephone Encounter (Signed)
Diabetes Management Follow Up [name] is a 68 y.o. female who was contacted for DM management. Identity was verified using date of birth and address.  Diabetes medications: Trulicity [id number] mg daily, metformin 100 mg BID patient correctly reports DM regimen and adherence  Home BG average 119 mg/dL (correlates with A1C of around 5.7 %) Hypoglycemia symptoms: denies home BG < 70 mg/dL Hyperglycemia symptoms: denies home BG > 300 mg/dL   A/P Blood glucose control: improved  Reviewed appropriate home blood glucose monitoring Most recent BG readings: 100 (02/06/19), 136 (9/12), 141 (9/13), 108 (9/14), 109 (9/15), 116 (9/16), 125 (9/17  Called patient to follow up after Trulicity start on [id number]. Patient denies any GI upset or other adverse effects. The patient stated that she was very pleased with her regimen.    Advised to contact clinic or seek medical attention if concerns or symptoms arise. Will notify PCP of findings  The patient verbalized understanding of information provided by repeating back concepts discussed.  Follow-up No follow-ups on file.  Brenton Grills

## 2019-02-13 ENCOUNTER — Other Ambulatory Visit: Payer: Self-pay

## 2019-02-13 ENCOUNTER — Inpatient Hospital Stay: Payer: Medicare Other

## 2019-02-13 ENCOUNTER — Telehealth: Payer: Self-pay | Admitting: Internal Medicine

## 2019-02-13 VITALS — BP 117/73 | HR 88 | Temp 98.9°F | Resp 16

## 2019-02-13 DIAGNOSIS — K922 Gastrointestinal hemorrhage, unspecified: Secondary | ICD-10-CM

## 2019-02-13 DIAGNOSIS — Z79899 Other long term (current) drug therapy: Secondary | ICD-10-CM | POA: Diagnosis not present

## 2019-02-13 DIAGNOSIS — D500 Iron deficiency anemia secondary to blood loss (chronic): Secondary | ICD-10-CM

## 2019-02-13 MED ORDER — FAMOTIDINE 20 MG PO TABS
ORAL_TABLET | ORAL | Status: AC
  Filled 2019-02-13: qty 1

## 2019-02-13 MED ORDER — FAMOTIDINE 20 MG PO TABS
20.0000 mg | ORAL_TABLET | Freq: Once | ORAL | Status: AC
  Administered 2019-02-13: 20 mg via ORAL

## 2019-02-13 MED ORDER — DIPHENHYDRAMINE HCL 50 MG/ML IJ SOLN
INTRAMUSCULAR | Status: AC
  Filled 2019-02-13: qty 1

## 2019-02-13 MED ORDER — SODIUM CHLORIDE 0.9 % IV SOLN
INTRAVENOUS | Status: DC
  Administered 2019-02-13: 15:00:00 via INTRAVENOUS
  Filled 2019-02-13: qty 250

## 2019-02-13 MED ORDER — DIPHENHYDRAMINE HCL 50 MG/ML IJ SOLN
50.0000 mg | Freq: Once | INTRAMUSCULAR | Status: AC
  Administered 2019-02-13: 50 mg via INTRAVENOUS

## 2019-02-13 MED ORDER — SODIUM CHLORIDE 0.9 % IV SOLN
510.0000 mg | Freq: Once | INTRAVENOUS | Status: AC
  Administered 2019-02-13: 510 mg via INTRAVENOUS
  Filled 2019-02-13: qty 510

## 2019-02-13 NOTE — Telephone Encounter (Signed)
This morning, I discussed with [name] her abnormal BMP findings, that include slightly worsened creatinine at 1.7 with a GFR of 34.  At her last appointment, we had decreased her gabapentin from 800 mg 3 times daily to 400 mg 3 times daily.  On discussion with [name] this morning, she stated that she actually takes 4/day, 1 in the morning, 1 in the afternoon, and 2 at night.  We had a discussion regarding the effects of gabapentin on her kidneys and the need to decrease that dose to no more than 3/day.  She is willing to try this out for 1 week and then follow-up.  She is concerned that with out the gabapentin either in the afternoon or the extra at night, she would have uncontrollable pain especially in her amputation.  She notes she has been on gabapentin for almost 10 years and it is always worked very well for her.  However, she does have a family history of renal disease requiring dialysis, so she really wants to make sure her kidneys do not get to that point.  If taking gabapentin only 3 times per day does not adequately control patient's pain, we may need to either switch gabapentin to something else, or add something additional on.  At on options include amitriptyline possibly just at night, or venlafaxine.  We will discuss this further at her next visit depending on how her pain level is doing.  Plan: - [facility] follow-up visit with me in 10 days on 9/28 at 3:15 PM - Start gabapentin 400 mg only 3 times daily

## 2019-02-13 NOTE — Patient Instructions (Signed)

## 2019-02-16 ENCOUNTER — Other Ambulatory Visit: Payer: Self-pay

## 2019-02-16 NOTE — Telephone Encounter (Signed)
Read over note, no meds supposed to be at pharm, confirmed instructions, reassured pt. She is good and states she will see dr B. 9/28.

## 2019-02-16 NOTE — Telephone Encounter (Signed)
Pt states there's no meds at the pharmacy, pt do not know the name of the med. Please call pt back. [facility].

## 2019-02-17 DIAGNOSIS — G933 Postviral fatigue syndrome: Secondary | ICD-10-CM | POA: Diagnosis not present

## 2019-02-18 NOTE — Telephone Encounter (Signed)
Thank you for following up with her, Jarrett Soho. I think we should reach out to her again around mid October and if she is still tolerating the Trulicity (dulaglutide), we can consider titrating to the 1.5mg  dose.  Kennon Holter, PharmD PGY1 Ambulatory Care Pharmacy Resident [employer] Phone: [phone]

## 2019-02-20 ENCOUNTER — Inpatient Hospital Stay: Payer: Medicare Other

## 2019-02-20 ENCOUNTER — Other Ambulatory Visit: Payer: Self-pay

## 2019-02-20 VITALS — BP 137/84 | HR 91 | Temp 98.7°F | Resp 18

## 2019-02-20 DIAGNOSIS — D500 Iron deficiency anemia secondary to blood loss (chronic): Secondary | ICD-10-CM | POA: Diagnosis not present

## 2019-02-20 DIAGNOSIS — Z79899 Other long term (current) drug therapy: Secondary | ICD-10-CM | POA: Diagnosis not present

## 2019-02-20 DIAGNOSIS — K922 Gastrointestinal hemorrhage, unspecified: Secondary | ICD-10-CM

## 2019-02-20 MED ORDER — FAMOTIDINE 20 MG PO TABS
20.0000 mg | ORAL_TABLET | Freq: Once | ORAL | Status: AC
  Administered 2019-02-20: 20 mg via ORAL

## 2019-02-20 MED ORDER — FAMOTIDINE 20 MG PO TABS
ORAL_TABLET | ORAL | Status: AC
  Filled 2019-02-20: qty 1

## 2019-02-20 MED ORDER — SODIUM CHLORIDE 0.9 % IV SOLN
INTRAVENOUS | Status: DC
  Administered 2019-02-20: 14:00:00 via INTRAVENOUS
  Filled 2019-02-20: qty 250

## 2019-02-20 MED ORDER — DIPHENHYDRAMINE HCL 50 MG/ML IJ SOLN
INTRAMUSCULAR | Status: AC
  Filled 2019-02-20: qty 1

## 2019-02-20 MED ORDER — DIPHENHYDRAMINE HCL 50 MG/ML IJ SOLN
50.0000 mg | Freq: Once | INTRAMUSCULAR | Status: AC
  Administered 2019-02-20: 50 mg via INTRAVENOUS

## 2019-02-20 MED ORDER — SODIUM CHLORIDE 0.9 % IV SOLN
510.0000 mg | Freq: Once | INTRAVENOUS | Status: AC
  Administered 2019-02-20: 510 mg via INTRAVENOUS
  Filled 2019-02-20: qty 17

## 2019-02-20 NOTE — Patient Instructions (Signed)

## 2019-02-23 ENCOUNTER — Ambulatory Visit (INDEPENDENT_AMBULATORY_CARE_PROVIDER_SITE_OTHER): Payer: Medicare Other | Admitting: Internal Medicine

## 2019-02-23 ENCOUNTER — Other Ambulatory Visit: Payer: Self-pay

## 2019-02-23 VITALS — BP 140/73 | HR 82 | Temp 98.8°F | Ht 61.0 in | Wt 198.7 lb

## 2019-02-23 DIAGNOSIS — F1090 Alcohol use, unspecified, uncomplicated: Secondary | ICD-10-CM

## 2019-02-23 DIAGNOSIS — Z794 Long term (current) use of insulin: Secondary | ICD-10-CM

## 2019-02-23 DIAGNOSIS — Z789 Other specified health status: Secondary | ICD-10-CM

## 2019-02-23 DIAGNOSIS — Z7289 Other problems related to lifestyle: Secondary | ICD-10-CM

## 2019-02-23 DIAGNOSIS — N289 Disorder of kidney and ureter, unspecified: Secondary | ICD-10-CM

## 2019-02-23 DIAGNOSIS — D509 Iron deficiency anemia, unspecified: Secondary | ICD-10-CM

## 2019-02-23 DIAGNOSIS — E1129 Type 2 diabetes mellitus with other diabetic kidney complication: Secondary | ICD-10-CM | POA: Diagnosis not present

## 2019-02-23 DIAGNOSIS — G479 Sleep disorder, unspecified: Secondary | ICD-10-CM

## 2019-02-23 DIAGNOSIS — E119 Type 2 diabetes mellitus without complications: Secondary | ICD-10-CM | POA: Diagnosis not present

## 2019-02-23 DIAGNOSIS — I10 Essential (primary) hypertension: Secondary | ICD-10-CM | POA: Diagnosis not present

## 2019-02-23 DIAGNOSIS — Z79899 Other long term (current) drug therapy: Secondary | ICD-10-CM

## 2019-02-23 DIAGNOSIS — Z7984 Long term (current) use of oral hypoglycemic drugs: Secondary | ICD-10-CM

## 2019-02-23 LAB — BASIC METABOLIC PANEL
Anion gap: 12 (ref 5–15)
BUN: 15 mg/dL (ref 8–23)
CO2: 24 mmol/L (ref 22–32)
Calcium: 9 mg/dL (ref 8.9–10.3)
Chloride: 105 mmol/L (ref 98–111)
Creatinine, Ser: 1.14 mg/dL — ABNORMAL HIGH (ref 0.44–1.00)
GFR calc Af Amer: 58 mL/min — ABNORMAL LOW (ref >60)
GFR calc non Af Amer: 50 mL/min — ABNORMAL LOW (ref >60)
Glucose, Bld: 113 mg/dL — ABNORMAL HIGH (ref 70–99)
Potassium: 4 mmol/L (ref 3.5–5.1)
Sodium: 141 mmol/L (ref 135–145)

## 2019-02-23 MED ORDER — TRULICITY 0.75 MG/0.5ML ~~LOC~~ SOAJ: 0.7500 mg | SUBCUTANEOUS | 0 refills | Status: DC

## 2019-02-23 NOTE — Progress Notes (Signed)
   CC: Diabetes follow up  HPI:  [name] is a 68 y.o. female with a PMHx of T2DM, HTN, IDA who presents to the clinic for follow up of her diabetes.   Please see the Encounters tab for problem-based Assessment & Plan for status of patients chronic conditions.   Past Medical History:  Diagnosis Date  . Allergic rhinitis   . Anemia, iron deficiency 05/03/2011   2/2 GI AVMs - recieves iron infusions as well as periodic red blood cell transfusions  . Anxiety   . Arteriovenous malformation of duodenum 02/25/2015  . CARPAL TUNNEL RELEASE, HX OF 07/22/2008  . Colon polyps   . Diabetes mellitus without complication ([place])    type 2  . Difficulty sleeping    takes trazadone for sleep  . Diverticulosis   . GERD (gastroesophageal reflux disease)   . Hepatitis C    in SVR with Harvoni 12/2014-02/2015.   Marland Kitchen History of hernia repair 08/18/2012  . History of porphyria 11/30/2014   Porphyria cutanea tarda in setting of chronic Hepatitis C, frequent  IV iron infusions, and alcohol abuse   . Hypertension   . Peripheral vascular disease ([place])   . Stroke [facility]) 2010   no residual deficits  . Substance use disorder    cocaine   Review of Systems:  Review of Systems  Constitutional: Negative for chills, fever, malaise/fatigue and weight loss.  Gastrointestinal: Negative for abdominal pain, diarrhea, nausea and vomiting.  Musculoskeletal: Negative for joint pain and myalgias.  Neurological: Negative for dizziness, tingling and headaches.  Psychiatric/Behavioral: Negative for depression and substance abuse. The patient does not have insomnia.    Physical Exam:  Vitals:   02/23/19 1501  BP: 140/73  Pulse: 82  Temp: 98.8 F (37.1 C)  SpO2: 100%  Weight: 198 lb 11.2 oz (90.1 kg)  Height: 5\' 1"  (1.549 m)   Physical Exam Vitals signs and nursing note reviewed.  Constitutional:      Appearance: She is obese.  Pulmonary:     Effort: Pulmonary effort is normal. No respiratory  distress.  Skin:    General: Skin is warm and dry.  Neurological:     Mental Status: She is alert and oriented to person, place, and time. Mental status is at baseline.  Psychiatric:        Mood and Affect: Mood normal.        Behavior: Behavior normal.    Assessment & Plan:   See Encounters Tab for problem based charting.  Patient seen with Dr. Philipp Ovens

## 2019-02-23 NOTE — Patient Instructions (Addendum)
It was nice seeing you today! Thank you for choosing [facility] for your Primary Care.    Today we talked about: Keep up the great work! We will follow up in 2 months.

## 2019-02-24 MED FILL — TRULICITY 0.75 MG/0.5 ML PE: 0.75 | 28 days supply | Qty: 2 | Fill #0

## 2019-02-24 NOTE — Assessment & Plan Note (Signed)
[name] reports she has been doing well with Trulicity since starting it approximately 3 weeks ago. She notes improved energy levels, improved sleep; feels this is due to medication change. She continues to take Metformin as well without side effects.   Plan: - Continue with Metformin and Trulicity  - Repeat [id number] in 2 months

## 2019-02-24 NOTE — Assessment & Plan Note (Signed)
Ms. Crail has improved her daily alcohol intake since her last visit. Previously she was drinking approximately 6 shots of liquor per day. Now, she is not longer drinking any liquor, but has a couple Mike's hard juice. Besides the high sugar, which we discussed finding the low sugar version, this is a large reduction in daily alcohol percentage.   No evidence of alcohol use disorder and hence not a candidate for Naltrexone.   Plan: - Continue to limit daily alcohol intake

## 2019-02-24 NOTE — Assessment & Plan Note (Signed)
Creatinine has significantly improved since last visit from 1.7 to 1.14. No additional interventions required today.   Plan: - Repeat BMP at next visit to ensure continued normalization in creatinine

## 2019-02-24 NOTE — Progress Notes (Signed)
Internal Medicine Clinic Attending  I saw and evaluated the patient.  I personally confirmed the key portions of the history and exam documented by Dr. Basaraba and I reviewed pertinent patient test results.  The assessment, diagnosis, and plan were formulated together and I agree with the documentation in the resident's note.    

## 2019-02-24 NOTE — Assessment & Plan Note (Signed)
Improved significantly since last visit. Patient has been increasing her exercise and decreasing alcohol intake. Likely helping sleep quality. Continue to monitor.

## 2019-02-24 NOTE — Assessment & Plan Note (Signed)
>>  ASSESSMENT AND PLAN FOR TYPE 2 DIABETES MELLITUS WITH DIABETIC NEUROPATHY ([facility]) WRITTEN ON 02/24/2019 11:50 AM BY ARNETT SAUNDERS, MD  Ms. Peek reports she has been doing well with Trulicity  since starting it approximately 3 weeks ago. She notes improved energy levels, improved sleep; feels this is due to medication change. She continues to take Metformin  as well without side effects.   Plan: - Continue with Metformin  and Trulicity   - Repeat A1C in 2 months

## 2019-02-25 NOTE — Telephone Encounter (Signed)
Just wanted to let you know that [name] is tolerating the Trulicity very well and feels much better with the new diabetes regimen.

## 2019-02-26 ENCOUNTER — Other Ambulatory Visit: Payer: Self-pay | Admitting: Internal Medicine

## 2019-02-26 DIAGNOSIS — M109 Gout, unspecified: Secondary | ICD-10-CM

## 2019-02-26 NOTE — Telephone Encounter (Signed)
Thank you for the update Dr. Charleen Kirks!

## 2019-03-11 ENCOUNTER — Other Ambulatory Visit: Payer: Self-pay

## 2019-03-11 ENCOUNTER — Inpatient Hospital Stay: Payer: Medicare Other | Attending: Nurse Practitioner

## 2019-03-11 DIAGNOSIS — D500 Iron deficiency anemia secondary to blood loss (chronic): Secondary | ICD-10-CM

## 2019-03-11 LAB — CBC WITH DIFFERENTIAL (CANCER CENTER ONLY)
Abs Immature Granulocytes: 0.12 10*3/uL — ABNORMAL HIGH (ref 0.00–0.07)
Basophils Absolute: 0 10*3/uL (ref 0.0–0.1)
Basophils Relative: 0 %
Eosinophils Absolute: 0.2 10*3/uL (ref 0.0–0.5)
Eosinophils Relative: 2 %
HCT: 32.9 % — ABNORMAL LOW (ref 36.0–46.0)
Hemoglobin: 10.3 g/dL — ABNORMAL LOW (ref 12.0–15.0)
Immature Granulocytes: 1 %
Lymphocytes Relative: 15 %
Lymphs Abs: 1.4 10*3/uL (ref 0.7–4.0)
MCH: 31.4 pg (ref 26.0–34.0)
MCHC: 31.3 g/dL (ref 30.0–36.0)
MCV: 100.3 fL — ABNORMAL HIGH (ref 80.0–100.0)
Monocytes Absolute: 0.9 10*3/uL (ref 0.1–1.0)
Monocytes Relative: 10 %
Neutro Abs: 6.7 10*3/uL (ref 1.7–7.7)
Neutrophils Relative %: 72 %
Platelet Count: 336 10*3/uL (ref 150–400)
RBC: 3.28 MIL/uL — ABNORMAL LOW (ref 3.87–5.11)
RDW: 15 % (ref 11.5–15.5)
WBC Count: 9.3 10*3/uL (ref 4.0–10.5)
nRBC: 0 % (ref 0.0–0.2)

## 2019-03-11 LAB — IRON AND TIBC
Iron: 39 ug/dL — ABNORMAL LOW (ref 41–142)
Saturation Ratios: 11 % — ABNORMAL LOW (ref 21–57)
TIBC: 364 ug/dL (ref 236–444)
UIBC: 325 ug/dL (ref 120–384)

## 2019-03-11 LAB — FERRITIN: Ferritin: 113 ng/mL (ref 11–307)

## 2019-03-12 ENCOUNTER — Telehealth: Payer: Self-pay

## 2019-03-12 ENCOUNTER — Encounter: Payer: Self-pay | Admitting: Hematology

## 2019-03-12 NOTE — Telephone Encounter (Signed)
TC to pt per Cira Rue NP to let her know that her ferritin remains >100, she does not need Feraheme this month. Continue monthly lab, f/u in 2 months or sooner if she has new/worsening concerns. Patient verbalized understanding. No further problems or concerns at this time.

## 2019-03-17 ENCOUNTER — Encounter: Payer: Self-pay | Admitting: Internal Medicine

## 2019-03-18 ENCOUNTER — Encounter: Payer: Medicare Other | Admitting: Internal Medicine

## 2019-03-18 ENCOUNTER — Telehealth: Payer: Self-pay | Admitting: *Deleted

## 2019-03-18 DIAGNOSIS — G933 Postviral fatigue syndrome: Secondary | ICD-10-CM | POA: Diagnosis not present

## 2019-03-18 NOTE — Telephone Encounter (Signed)
Received fax PCS form from [facility]. Completed by Dr. Lynnae January and faxed back at [phone]. Hubbard Hartshorn, BSN, RN-BC

## 2019-03-26 ENCOUNTER — Other Ambulatory Visit: Payer: Self-pay | Admitting: Internal Medicine

## 2019-03-26 DIAGNOSIS — M109 Gout, unspecified: Secondary | ICD-10-CM

## 2019-03-27 MED FILL — TRULICITY 0.75 MG/0.5 ML PE: 0.75 | 28 days supply | Qty: 2 | Fill #0

## 2019-04-13 ENCOUNTER — Inpatient Hospital Stay: Payer: Medicare Other | Attending: Nurse Practitioner

## 2019-04-13 ENCOUNTER — Other Ambulatory Visit: Payer: Self-pay

## 2019-04-13 DIAGNOSIS — Z8619 Personal history of other infectious and parasitic diseases: Secondary | ICD-10-CM

## 2019-04-13 DIAGNOSIS — B182 Chronic viral hepatitis C: Secondary | ICD-10-CM | POA: Diagnosis not present

## 2019-04-13 DIAGNOSIS — D500 Iron deficiency anemia secondary to blood loss (chronic): Secondary | ICD-10-CM

## 2019-04-13 DIAGNOSIS — Z79899 Other long term (current) drug therapy: Secondary | ICD-10-CM | POA: Insufficient documentation

## 2019-04-13 DIAGNOSIS — K5521 Angiodysplasia of colon with hemorrhage: Secondary | ICD-10-CM | POA: Insufficient documentation

## 2019-04-13 LAB — FERRITIN: Ferritin: 7 ng/mL — ABNORMAL LOW (ref 11–307)

## 2019-04-13 LAB — CBC WITH DIFFERENTIAL (CANCER CENTER ONLY)
Abs Immature Granulocytes: 0.05 10*3/uL (ref 0.00–0.07)
Basophils Absolute: 0 10*3/uL (ref 0.0–0.1)
Basophils Relative: 1 %
Eosinophils Absolute: 0.1 10*3/uL (ref 0.0–0.5)
Eosinophils Relative: 2 %
HCT: 31.2 % — ABNORMAL LOW (ref 36.0–46.0)
Hemoglobin: 9.9 g/dL — ABNORMAL LOW (ref 12.0–15.0)
Immature Granulocytes: 1 %
Lymphocytes Relative: 17 %
Lymphs Abs: 1.3 10*3/uL (ref 0.7–4.0)
MCH: 28.9 pg (ref 26.0–34.0)
MCHC: 31.7 g/dL (ref 30.0–36.0)
MCV: 91 fL (ref 80.0–100.0)
Monocytes Absolute: 0.9 10*3/uL (ref 0.1–1.0)
Monocytes Relative: 12 %
Neutro Abs: 5.3 10*3/uL (ref 1.7–7.7)
Neutrophils Relative %: 67 %
Platelet Count: 407 10*3/uL — ABNORMAL HIGH (ref 150–400)
RBC: 3.43 MIL/uL — ABNORMAL LOW (ref 3.87–5.11)
RDW: 14.6 % (ref 11.5–15.5)
WBC Count: 7.7 10*3/uL (ref 4.0–10.5)
nRBC: 0 % (ref 0.0–0.2)

## 2019-04-13 LAB — IRON AND TIBC
Iron: 28 ug/dL — ABNORMAL LOW (ref 41–142)
Saturation Ratios: 7 % — ABNORMAL LOW (ref 21–57)
TIBC: 409 ug/dL (ref 236–444)
UIBC: 381 ug/dL (ref 120–384)

## 2019-04-14 ENCOUNTER — Ambulatory Visit (INDEPENDENT_AMBULATORY_CARE_PROVIDER_SITE_OTHER): Payer: Medicare Other | Admitting: Internal Medicine

## 2019-04-14 ENCOUNTER — Other Ambulatory Visit (HOSPITAL_COMMUNITY)
Admission: RE | Admit: 2019-04-14 | Discharge: 2019-04-14 | Disposition: A | Discharge status: Home / Self Care | Payer: Medicare Other | Source: Ambulatory Visit | Attending: Internal Medicine | Admitting: Internal Medicine

## 2019-04-14 ENCOUNTER — Telehealth: Payer: Self-pay | Admitting: *Deleted

## 2019-04-14 ENCOUNTER — Telehealth: Payer: Self-pay | Admitting: Hematology

## 2019-04-14 VITALS — BP 146/77 | HR 95 | Temp 98.9°F | Wt 193.3 lb

## 2019-04-14 DIAGNOSIS — N189 Chronic kidney disease, unspecified: Secondary | ICD-10-CM

## 2019-04-14 DIAGNOSIS — E1140 Type 2 diabetes mellitus with diabetic neuropathy, unspecified: Secondary | ICD-10-CM

## 2019-04-14 DIAGNOSIS — Z7289 Other problems related to lifestyle: Secondary | ICD-10-CM

## 2019-04-14 DIAGNOSIS — N898 Other specified noninflammatory disorders of vagina: Secondary | ICD-10-CM | POA: Diagnosis present

## 2019-04-14 DIAGNOSIS — M109 Gout, unspecified: Secondary | ICD-10-CM

## 2019-04-14 DIAGNOSIS — A5901 Trichomonal vulvovaginitis: Secondary | ICD-10-CM

## 2019-04-14 DIAGNOSIS — Z794 Long term (current) use of insulin: Secondary | ICD-10-CM

## 2019-04-14 DIAGNOSIS — Z90710 Acquired absence of both cervix and uterus: Secondary | ICD-10-CM | POA: Insufficient documentation

## 2019-04-14 DIAGNOSIS — Z789 Other specified health status: Secondary | ICD-10-CM

## 2019-04-14 DIAGNOSIS — N289 Disorder of kidney and ureter, unspecified: Secondary | ICD-10-CM

## 2019-04-14 DIAGNOSIS — S78112A Complete traumatic amputation at level between left hip and knee, initial encounter: Secondary | ICD-10-CM

## 2019-04-14 DIAGNOSIS — D509 Iron deficiency anemia, unspecified: Secondary | ICD-10-CM

## 2019-04-14 DIAGNOSIS — Z79899 Other long term (current) drug therapy: Secondary | ICD-10-CM

## 2019-04-14 DIAGNOSIS — Z7984 Long term (current) use of oral hypoglycemic drugs: Secondary | ICD-10-CM

## 2019-04-14 DIAGNOSIS — E1122 Type 2 diabetes mellitus with diabetic chronic kidney disease: Secondary | ICD-10-CM

## 2019-04-14 DIAGNOSIS — M1A9XX0 Chronic gout, unspecified, without tophus (tophi): Secondary | ICD-10-CM

## 2019-04-14 DIAGNOSIS — Z89612 Acquired absence of left leg above knee: Secondary | ICD-10-CM

## 2019-04-14 DIAGNOSIS — Q2733 Arteriovenous malformation of digestive system vessel: Secondary | ICD-10-CM

## 2019-04-14 LAB — POCT GLYCOSYLATED HEMOGLOBIN (HGB A1C): Hemoglobin A1C: 5.3 % (ref 4.0–5.6)

## 2019-04-14 LAB — BASIC METABOLIC PANEL
Anion gap: 12 (ref 5–15)
BUN: 16 mg/dL (ref 8–23)
CO2: 24 mmol/L (ref 22–32)
Calcium: 8.3 mg/dL — ABNORMAL LOW (ref 8.9–10.3)
Chloride: 105 mmol/L (ref 98–111)
Creatinine, Ser: 1.04 mg/dL — ABNORMAL HIGH (ref 0.44–1.00)
GFR calc Af Amer: >60 mL/min (ref >60)
GFR calc non Af Amer: 56 mL/min — ABNORMAL LOW (ref >60)
Glucose, Bld: 88 mg/dL (ref 70–99)
Potassium: 4.2 mmol/L (ref 3.5–5.1)
Sodium: 141 mmol/L (ref 135–145)

## 2019-04-14 LAB — GLUCOSE, CAPILLARY: Glucose-Capillary: 97 mg/dL (ref 70–99)

## 2019-04-14 LAB — AFP TUMOR MARKER: AFP, Serum, Tumor Marker: 2.8 ng/mL (ref 0.0–8.3)

## 2019-04-14 NOTE — Progress Notes (Signed)
CC: diabetes and CKD follow up  HPI:  [name] is a 68 y.o. with a PMHx of T2DM, CKD, chronic IDA due to GI AVMs, who presents to the clinic for diabetes and CKD follow up.   Please see the Encounters tab for problem-based Assessment & Plan regarding status of patient's chronic conditions.  Mr. Ballardo notes that approximately 1 week ago, she had unprotected sexual encounters with a female that she has been seeing for 8-9 years. Afterwards, she developed 2 days of dysuria that resolved without intervention. Afterwards, she started having malodorous vaginal discharge that was yellow in color. She described the odor as fishy. She is concerned about STDs, as her partner is married to another women and does have multiple partners.   Past Medical History:  Diagnosis Date  . Allergic rhinitis   . Anemia, iron deficiency 05/03/2011   2/2 GI AVMs - recieves iron infusions as well as periodic red blood cell transfusions  . Anxiety   . Arteriovenous malformation of duodenum 02/25/2015  . CARPAL TUNNEL RELEASE, HX OF 07/22/2008  . Colon polyps   . Diabetes mellitus without complication ([place])    type 2  . Difficulty sleeping    takes trazadone for sleep  . Diverticulosis   . GERD (gastroesophageal reflux disease)   . Hepatitis C    in SVR with Harvoni 12/2014-02/2015.   Marland Kitchen History of hernia repair 08/18/2012  . History of porphyria 11/30/2014   Porphyria cutanea tarda in setting of chronic Hepatitis C, frequent  IV iron infusions, and alcohol abuse   . Hypertension   . Peripheral vascular disease ([place])   . Stroke [facility]) 2010   no residual deficits  . Substance use disorder    cocaine   Review of Systems: Review of Systems  Constitutional: Negative for chills and fever.  Respiratory: Negative for cough and shortness of breath.   Cardiovascular: Negative for chest pain and leg swelling.  Gastrointestinal: Negative for abdominal pain, diarrhea, nausea and vomiting.  Genitourinary:  Positive for dysuria.       + vaginal discharge + vaginal odor  Musculoskeletal: Positive for myalgias (chronic leg pain).  Psychiatric/Behavioral: Negative for substance abuse.    Physical Exam:  Vitals:   04/14/19 1333  BP: (!) 146/77  Pulse: 95  Temp: 98.9 F (37.2 C)  TempSrc: Oral  SpO2: 97%  Weight: 193 lb 4.8 oz (87.7 kg)    Physical Exam Vitals signs and nursing note reviewed. Exam conducted with a chaperone present.  Constitutional:      General: She is not in acute distress.    Appearance: She is obese. She is not toxic-appearing.  Pulmonary:     Effort: Pulmonary effort is normal. No respiratory distress.  Genitourinary:    General: Normal vulva.     Pubic Area: No rash.      Labia:        Right: No rash.        Left: No rash.      Vagina: Vaginal discharge (yellowish discharge throughout canal but mostly concentrated around cervix) present. No tenderness.     Cervix: Discharge present.  Skin:    General: Skin is warm and dry.  Neurological:     Mental Status: She is alert and oriented to person, place, and time. Mental status is at baseline.  Psychiatric:        Mood and Affect: Mood normal.        Behavior: Behavior normal.  Assessment & Plan:   See Encounters Tab for problem based charting.  Patient seen with Dr. Rebeca Alert

## 2019-04-14 NOTE — Telephone Encounter (Signed)
-----   Message from Truitt Merle, MD sent at 04/14/2019  8:25 AM EST ----- Please let pt know her iron level is very low, please schedule iv feraheme weekly X2 in the next few weeks, thanks   Truitt Merle  04/14/2019

## 2019-04-14 NOTE — Telephone Encounter (Signed)
Scheduled appt per 11/17 sch message- pt aware of apt date and time

## 2019-04-14 NOTE — Telephone Encounter (Signed)
Called pt & informed of low iron level & need to get [facility] weekly x 2 in next few weeks per Dr Burr Medico.  She expressed understanding.  Message to schedulers.

## 2019-04-14 NOTE — Patient Instructions (Addendum)
It was nice seeing you today! Thank you for choosing [facility] for your Primary Care.    Today we talked about:   1. I will call you as your results comes in

## 2019-04-15 LAB — CERVICOVAGINAL ANCILLARY ONLY
Bacterial Vaginitis (gardnerella): POSITIVE — AB
Candida Glabrata: NEGATIVE
Candida Vaginitis: NEGATIVE
Chlamydia: NEGATIVE
Comment: NEGATIVE
Comment: NEGATIVE
Comment: NEGATIVE
Comment: NEGATIVE
Comment: NEGATIVE
Comment: NORMAL
Neisseria Gonorrhea: NEGATIVE
Trichomonas: POSITIVE — AB

## 2019-04-15 LAB — URINALYSIS, ROUTINE W REFLEX MICROSCOPIC
Bilirubin, UA: NEGATIVE
Glucose, UA: NEGATIVE
Ketones, UA: NEGATIVE
Nitrite, UA: NEGATIVE
Specific Gravity, UA: 1.013 (ref 1.005–1.030)
Urobilinogen, Ur: 0.2 mg/dL (ref 0.2–1.0)
pH, UA: 6.5 (ref 5.0–7.5)

## 2019-04-15 LAB — MICROSCOPIC EXAMINATION
Bacteria, UA: NONE SEEN
Casts: NONE SEEN /lpf

## 2019-04-15 LAB — HIV ANTIBODY (ROUTINE TESTING W REFLEX): HIV Screen 4th Generation wRfx: NONREACTIVE

## 2019-04-15 LAB — RPR: RPR Ser Ql: NONREACTIVE

## 2019-04-16 DIAGNOSIS — A5901 Trichomonal vulvovaginitis: Secondary | ICD-10-CM

## 2019-04-16 HISTORY — DX: Trichomonal vulvovaginitis: A59.01

## 2019-04-16 LAB — CYTOLOGY - PAP: Diagnosis: NEGATIVE

## 2019-04-16 MED ORDER — METRONIDAZOLE 500 MG PO TABS: 500.0000 mg | ORAL_TABLET | Freq: Two times a day (BID) | ORAL | 0 refills | Status: AC

## 2019-04-16 MED FILL — metroNIDAZOLE 500 MG TABS: 500 | 7 days supply | Qty: 14 | Fill #0

## 2019-04-16 NOTE — Assessment & Plan Note (Addendum)
Current medications: Metformin 1000 mg BID, Diraglutide (Trulicity)   123456: 0000000  Ms. Giorgi continues to do well with her medication regimen. She feels that the Trulicity has increased her energy levels.   No changes to medication as this time, but may consider future decreasing medication regimen in the future. Given the renal and cardiac benefits of Trulicity, would prefer to d/c Metformin.   Ms. Butka endorses that she has having more neuropathy since we have decreased her Gabapentin. Symptoms are worst at night. We discussed that given the need to renally dose Gabapentin, we need to wait for improvement in kidney function before we increase dose again.   Incidentally, she was noted to have a low ferritin by her hematologist. They have ordered Ferraheme to be started. This may be contributing if she has any symptoms of restless leg as well.   Plan:  - Continue with current regimen - Hold off increasing Gabapentin till [facility] treatment as completed

## 2019-04-16 NOTE — Assessment & Plan Note (Signed)
>>  ASSESSMENT AND PLAN FOR TYPE 2 DIABETES MELLITUS WITH DIABETIC NEUROPATHY ([facility]) WRITTEN ON 04/16/2019 11:06 PM BY ARNETT SAUNDERS, MD  Current medications: Metformin  1000 mg BID, Diraglutide (Trulicity )   A1c: 5.3%  Ms. Hribar continues to do well with her medication regimen. She feels that the Trulicity  has increased her energy levels.   No changes to medication as this time, but may consider future decreasing medication regimen in the future. Given the renal and cardiac benefits of Trulicity , would prefer to d/c Metformin .   Ms. Agostinelli endorses that she has having more neuropathy since we have decreased her Gabapentin . Symptoms are worst at night. We discussed that given the need to renally dose Gabapentin , we need to wait for improvement in kidney function before we increase dose again.   Incidentally, she was noted to have a low ferritin by her hematologist. They have ordered Ferraheme to be started. This may be contributing if she has any symptoms of restless leg as well.   Plan:  - Continue with current regimen - Hold off increasing Gabapentin  till Feraheme  treatment as completed

## 2019-04-16 NOTE — Assessment & Plan Note (Signed)
[name] developed vaginal discharge and odor after unprotected sexual contact. Examination was consistent with this; multiple labs were obtained, including HIV, RPR, wet prep, G/C. Results were positive for trichomonas, as well as bacterial vaginosis.   Patient was informed of the results and recommended to inform her partner, so that he can be treated as well.   Plan:  - Metronidazole 500 mg BID for 7 days  - Re-test if no symptom resolve after treatment

## 2019-04-16 NOTE — Assessment & Plan Note (Addendum)
[name] endorses that she has having more neuropathy since we have decreased her Gabapentin. Symptoms are worst at night. We discussed that given the need to renally dose Gabapentin, we need to wait for improvement in kidney function before we increase dose again.   Incidentally, she was noted to have a low ferritin by her hematologist. They have ordered Ferraheme to be started. This may be contributing if she has any symptoms of restless leg as well.   DME order placed for bedside commode. Patient's current bedside commode has a broken handle that makes it unsafe to use at this time. Given the time it takes to place prosthetic, patient needs a closer option than her bathroom for safety issues.

## 2019-04-16 NOTE — Assessment & Plan Note (Addendum)
BMP was recheck today and creatinine continues to improve. It is now within a normal range. Will continue to monitor regularly.

## 2019-04-16 NOTE — Assessment & Plan Note (Signed)
[name] states she feels her right great toe is in the process of developing gout. On examination, there was very mild increased warmth but no tenderness, swelling or deformation.   She states that when this occurs, she usually takes 1 colchicine tablet and that is enough to be able to tolerate the pain. She denies needing any refills at this time.   She notes this occurs every couple months, but not more often.

## 2019-04-16 NOTE — Assessment & Plan Note (Signed)
[name] continues to drink Mike's hard fruit drinks daily but has transitioned to the sugar-free variation. We discussed the importance of continuing to work on decreasing use consistently.

## 2019-04-17 NOTE — Progress Notes (Signed)
Internal Medicine Clinic Attending  I saw and evaluated the patient.  I personally confirmed the key portions of the history and exam documented by Dr. Charleen Kirks and I reviewed pertinent patient test results.  I also supervised the pelvic exam and pap smear. The assessment, diagnosis, and plan were formulated together and I agree with the documentation in the resident's note.  Lenice Pressman, M.D., Ph.D.

## 2019-04-20 ENCOUNTER — Other Ambulatory Visit: Payer: Self-pay | Admitting: Internal Medicine

## 2019-04-20 ENCOUNTER — Inpatient Hospital Stay: Payer: Medicare Other

## 2019-04-20 ENCOUNTER — Other Ambulatory Visit: Payer: Self-pay

## 2019-04-20 VITALS — BP 159/89 | HR 83 | Temp 98.9°F | Resp 18

## 2019-04-20 DIAGNOSIS — D500 Iron deficiency anemia secondary to blood loss (chronic): Secondary | ICD-10-CM

## 2019-04-20 DIAGNOSIS — E119 Type 2 diabetes mellitus without complications: Secondary | ICD-10-CM

## 2019-04-20 DIAGNOSIS — Z79899 Other long term (current) drug therapy: Secondary | ICD-10-CM | POA: Diagnosis not present

## 2019-04-20 DIAGNOSIS — K5521 Angiodysplasia of colon with hemorrhage: Secondary | ICD-10-CM | POA: Diagnosis not present

## 2019-04-20 DIAGNOSIS — K922 Gastrointestinal hemorrhage, unspecified: Secondary | ICD-10-CM

## 2019-04-20 DIAGNOSIS — G933 Postviral fatigue syndrome: Secondary | ICD-10-CM | POA: Diagnosis not present

## 2019-04-20 DIAGNOSIS — Z794 Long term (current) use of insulin: Secondary | ICD-10-CM

## 2019-04-20 MED ORDER — FAMOTIDINE IN NACL 20-0.9 MG/50ML-% IV SOLN
INTRAVENOUS | Status: AC
  Filled 2019-04-20: qty 50

## 2019-04-20 MED ORDER — DIPHENHYDRAMINE HCL 50 MG/ML IJ SOLN
INTRAMUSCULAR | Status: AC
  Filled 2019-04-20: qty 1

## 2019-04-20 MED ORDER — FAMOTIDINE 20 MG PO TABS
20.0000 mg | ORAL_TABLET | Freq: Once | ORAL | Status: AC
  Administered 2019-04-20: 20 mg via ORAL

## 2019-04-20 MED ORDER — FAMOTIDINE 20 MG PO TABS
ORAL_TABLET | ORAL | Status: AC
  Filled 2019-04-20: qty 1

## 2019-04-20 MED ORDER — DIPHENHYDRAMINE HCL 50 MG/ML IJ SOLN
50.0000 mg | Freq: Once | INTRAMUSCULAR | Status: AC
  Administered 2019-04-20: 14:00:00 50 mg via INTRAVENOUS

## 2019-04-20 MED ORDER — SODIUM CHLORIDE 0.9 % IV SOLN
510.0000 mg | Freq: Once | INTRAVENOUS | Status: AC
  Administered 2019-04-20: 15:00:00 510 mg via INTRAVENOUS
  Filled 2019-04-20: qty 510

## 2019-04-20 MED ORDER — SODIUM CHLORIDE 0.9 % IV SOLN
INTRAVENOUS | Status: DC
  Administered 2019-04-20: 14:00:00 via INTRAVENOUS
  Filled 2019-04-20 (×2): qty 250

## 2019-04-20 NOTE — Patient Instructions (Signed)

## 2019-04-21 ENCOUNTER — Telehealth: Payer: Self-pay | Admitting: Hematology

## 2019-04-21 ENCOUNTER — Telehealth: Payer: Self-pay | Admitting: *Deleted

## 2019-04-21 ENCOUNTER — Other Ambulatory Visit: Payer: Self-pay | Admitting: Internal Medicine

## 2019-04-21 DIAGNOSIS — S78112A Complete traumatic amputation at level between left hip and knee, initial encounter: Secondary | ICD-10-CM

## 2019-04-21 NOTE — Telephone Encounter (Signed)
I addended the note just now. Thank you!!

## 2019-04-21 NOTE — Telephone Encounter (Signed)
Call day 12/16 lab/fu moved to 12/18. Confirmed with patient.

## 2019-04-22 NOTE — Telephone Encounter (Signed)
Community message sent to Jolee Ewing at [facility] for [facility] order. F2F 04/14/2019. Hubbard Hartshorn, BSN, RN-BC

## 2019-04-27 ENCOUNTER — Inpatient Hospital Stay: Payer: Medicare Other

## 2019-04-27 ENCOUNTER — Other Ambulatory Visit: Payer: Self-pay

## 2019-04-27 VITALS — BP 154/90 | HR 98 | Temp 99.0°F | Resp 16

## 2019-04-27 DIAGNOSIS — S78119A Complete traumatic amputation at level between unspecified hip and knee, initial encounter: Secondary | ICD-10-CM | POA: Diagnosis not present

## 2019-04-27 DIAGNOSIS — K922 Gastrointestinal hemorrhage, unspecified: Secondary | ICD-10-CM

## 2019-04-27 DIAGNOSIS — S31102A Unspecified open wound of abdominal wall, epigastric region without penetration into peritoneal cavity, initial encounter: Secondary | ICD-10-CM | POA: Diagnosis not present

## 2019-04-27 DIAGNOSIS — K5521 Angiodysplasia of colon with hemorrhage: Secondary | ICD-10-CM | POA: Diagnosis not present

## 2019-04-27 DIAGNOSIS — D500 Iron deficiency anemia secondary to blood loss (chronic): Secondary | ICD-10-CM | POA: Diagnosis not present

## 2019-04-27 DIAGNOSIS — Z79899 Other long term (current) drug therapy: Secondary | ICD-10-CM | POA: Diagnosis not present

## 2019-04-27 DIAGNOSIS — Z89612 Acquired absence of left leg above knee: Secondary | ICD-10-CM | POA: Diagnosis not present

## 2019-04-27 MED ORDER — DIPHENHYDRAMINE HCL 50 MG/ML IJ SOLN
50.0000 mg | Freq: Once | INTRAMUSCULAR | Status: AC
  Administered 2019-04-27: 50 mg via INTRAVENOUS

## 2019-04-27 MED ORDER — SODIUM CHLORIDE 0.9 % IV SOLN
INTRAVENOUS | Status: DC
  Administered 2019-04-27: 14:00:00 via INTRAVENOUS
  Filled 2019-04-27: qty 250

## 2019-04-27 MED ORDER — FAMOTIDINE 20 MG PO TABS
ORAL_TABLET | ORAL | Status: AC
  Filled 2019-04-27: qty 1

## 2019-04-27 MED ORDER — SODIUM CHLORIDE 0.9 % IV SOLN
510.0000 mg | Freq: Once | INTRAVENOUS | Status: AC
  Administered 2019-04-27: 510 mg via INTRAVENOUS
  Filled 2019-04-27: qty 17

## 2019-04-27 MED ORDER — DIPHENHYDRAMINE HCL 50 MG/ML IJ SOLN
INTRAMUSCULAR | Status: AC
  Filled 2019-04-27: qty 1

## 2019-04-27 MED ORDER — FAMOTIDINE 20 MG PO TABS
20.0000 mg | ORAL_TABLET | Freq: Once | ORAL | Status: AC
  Administered 2019-04-27: 20 mg via ORAL

## 2019-04-27 NOTE — Patient Instructions (Signed)

## 2019-05-11 DIAGNOSIS — G933 Postviral fatigue syndrome: Secondary | ICD-10-CM | POA: Diagnosis not present

## 2019-05-13 ENCOUNTER — Other Ambulatory Visit: Payer: Medicare Other

## 2019-05-13 ENCOUNTER — Ambulatory Visit: Payer: Medicare Other | Admitting: Hematology

## 2019-05-14 NOTE — Progress Notes (Signed)
[place]   [phone] Fax:(336) [phone]   Clinic Follow up Note   Patient Care Team: Jose Persia, MD as PCP - Evalina Field, MD as Consulting Physician (Ophthalmology) Coralie Keens, MD as Consulting Physician (General Surgery) Truitt Merle, MD as Consulting Physician (Hematology) Comer, Okey Regal, MD as Consulting Physician (Infectious Diseases)  Date of Service:  05/15/2019  CHIEF COMPLAINT: F/u of Anemia   CURRENT THERAPY:  IV Feraheme as neededif ferritin <100  INTERVAL HISTORY:  [name] is here for a follow up of anemia. She presents to the clinic alone. She notes she is doing well. She denies any falls recently. She notes she is has been on Gabapentin since 2009 for her neuropathy. Her PCP plans to switch her to another nerve medication.  She notes she feels better after IV iron infusions. She notes she can tell when she needs it based on her body changes.     REVIEW OF SYSTEMS:   Constitutional: Denies fevers, chills or abnormal weight loss Eyes: Denies blurriness of vision Ears, nose, mouth, throat, and face: Denies mucositis or sore throat Respiratory: Denies cough, dyspnea or wheezes Cardiovascular: Denies palpitation, chest discomfort or lower extremity swelling Gastrointestinal:  Denies nausea, heartburn or change in bowel habits Skin: Denies abnormal skin rashes Lymphatics: Denies new lymphadenopathy or easy bruising Neurological:Denies numbness, tingling or new weaknesses Behavioral/Psych: Mood is stable, no new changes  All other systems were reviewed with the patient and are negative.  MEDICAL HISTORY:  Past Medical History:  Diagnosis Date  . Allergic rhinitis   . Anemia, iron deficiency 05/03/2011   2/2 GI AVMs - recieves iron infusions as well as periodic red blood cell transfusions  . Anxiety   . Arteriovenous malformation of duodenum 02/25/2015  . CARPAL TUNNEL RELEASE, HX OF 07/22/2008  . Colon  polyps   . Diabetes mellitus without complication ([place])    type 2  . Difficulty sleeping    takes trazadone for sleep  . Diverticulosis   . GERD (gastroesophageal reflux disease)   . Hepatitis C    in SVR with Harvoni 12/2014-02/2015.   Marland Kitchen History of hernia repair 08/18/2012  . History of porphyria 11/30/2014   Porphyria cutanea tarda in setting of chronic Hepatitis C, frequent  IV iron infusions, and alcohol abuse   . Hypertension   . Peripheral vascular disease ([place])   . Stroke [facility]) 2010   no residual deficits  . Substance use disorder    cocaine    SURGICAL HISTORY: Past Surgical History:  Procedure Laterality Date  . ABDOMINAL HYSTERECTOMY    . APPLICATION OF WOUND VAC N/A 06/21/2014   Procedure: APPLICATION OF WOUND VAC;  Surgeon: Coralie Keens, MD;  Location: [facility];  Service: General;  Laterality: N/A;  . CARPAL TUNNEL RELEASE     rt hand  . COLONOSCOPY WITH PROPOFOL N/A 03/17/2018   Procedure: COLONOSCOPY WITH PROPOFOL;  Surgeon: Doran Stabler, MD;  Location: [facility];  Service: Gastroenterology;  Laterality: N/A;  . ENTEROSCOPY N/A 12/04/2012   Procedure: ENTEROSCOPY;  Surgeon: Beryle Beams, MD;  Location[facility];  Service: Endoscopy;  Laterality: N/A;  . ENTEROSCOPY N/A 12/18/2012   Procedure: ENTEROSCOPY;  Surgeon: Beryle Beams, MD;  [facility];  Service: Endoscopy;  Laterality: N/A;  . ENTEROSCOPY N/A 02/25/2015   Procedure: ENTEROSCOPY;  Surgeon: Inda Castle, MD;  Location: [facility]COPY;  Service: Endoscopy;  Laterality: N/A;  . ENTEROSCOPY N/A 10/29/2017  Procedure: ENTEROSCOPY;  Surgeon: Doran Stabler, MD;  Location: [facility];  Service: Gastroenterology;  Laterality: N/A;  . ESOPHAGOGASTRODUODENOSCOPY  05/05/2011   Procedure: ESOPHAGOGASTRODUODENOSCOPY (EGD);  Surgeon: Zenovia Jarred, MD;  Location: [facility];  Service: Gastroenterology;  Laterality: N/A;  Dr. Hilarie Fredrickson will do procedure for Dr. Benson Norway Saturday.  .  ESOPHAGOGASTRODUODENOSCOPY  05/08/2011   Procedure: ESOPHAGOGASTRODUODENOSCOPY (EGD);  Surgeon: Beryle Beams;  [facility];  Service: Endoscopy;  Laterality: N/A;  . ESOPHAGOGASTRODUODENOSCOPY  06/07/2011   Procedure: ESOPHAGOGASTRODUODENOSCOPY (EGD);  Surgeon: Beryle Beams, MD;  Location: [facility];  Service: Endoscopy;  Laterality: N/A;  . ESOPHAGOGASTRODUODENOSCOPY  12/20/2011   Procedure: ESOPHAGOGASTRODUODENOSCOPY (EGD);  Surgeon: Beryle Beams, MD;  Location: [facility];  Service: Endoscopy;  Laterality: N/A;  . EYE SURGERY Bilateral    cataracts  . FLEXIBLE SIGMOIDOSCOPY  12/21/2011   Procedure: FLEXIBLE SIGMOIDOSCOPY;  Surgeon: Beryle Beams, MD;  [facility];  Service: Endoscopy;  Laterality: N/A;  . HOT HEMOSTASIS  06/07/2011   Procedure: HOT HEMOSTASIS (ARGON PLASMA COAGULATION/BICAP);  Surgeon: Beryle Beams, MD;  Location[facility];  Service: Endoscopy;  Laterality: N/A;  . INCISIONAL HERNIA REPAIR N/A 06/01/2014   Procedure: ATTEMPTED LAPAROSCOPIC AND OPEN INCISIONAL HERNIA REPAIR WITH MESH;  Surgeon: Coralie Keens, MD;  Location: [facility];  Service: General;  Laterality: N/A;  . INSERTION OF MESH N/A 06/01/2014   Procedure: INSERTION OF MESH;  Surgeon: Coralie Keens, MD;  Location: [facility];  Service: General;  Laterality: N/A;  . LAPAROTOMY  02/16/2012   Procedure: EXPLORATORY LAPAROTOMY;  Surgeon: Edward Jolly, MD;  [facility];  Service: General;  Laterality: N/A;  oversewing of anastomotic leak and rigid sigmoidoscopy  . LAPAROTOMY N/A 06/21/2014   Procedure: ABDOMINAL WOUND EXPLORATION;  Surgeon: Coralie Keens, MD;  Location: [facility];  Service: General;  Laterality: N/A;  . LEG AMPUTATION ABOVE KNEE     left  . PARTIAL COLECTOMY  02/15/2012   Procedure: PARTIAL COLECTOMY;  Surgeon: Harl Bowie, MD;  [facility];  Service: General;  Laterality: N/A;  . TONSILLECTOMY      I have reviewed the social history and family  history with the patient and they are unchanged from previous note.  ALLERGIES:  is allergic to ciprofloxacin; morphine and related; penicillins; codeine; lisinopril; and feraheme [ferumoxytol].  MEDICATIONS:  Current Outpatient Medications  Medication Sig Dispense Refill  . ACCU-CHEK FASTCLIX LANCETS MISC Use to test blood glucose 3 times daily. 100 each 11  . ACCU-CHEK GUIDE test strip CHECK BLOOD SUGAR 3 TIMES A DAY 100 each 11  . allopurinol (ZYLOPRIM) 300 MG tablet TAKE 1 TABLET BY MOUTH ONCE DAILY 30 tablet 4  . aspirin EC 81 MG tablet Take 1 tablet (81 mg total) by mouth daily. 30 tablet 1  . Blood Glucose Monitoring Suppl (ACCU-CHEK GUIDE) w/Device KIT 1 each by Does not apply route 3 (three) times daily. The patient is insulin requiring, ICD 10 code E11.65. The patient tests 3 times a day. 1 kit 1  . cetirizine (ZYRTEC) 10 MG tablet TAKE 1 TABLET BY MOUTH DAILY 90 tablet 0  . cholecalciferol (VITAMIN D) 1000 units tablet Take 1 tablet (1,000 Units total) by mouth daily. 100 tablet 2  . colchicine 0.6 MG tablet TAKE ONE TABLET BY MOUTH DAILY 30 tablet 4  . DILT-XR 180 MG 24 hr capsule TAKE 1 CAPSULE BY MOUTH ONCE DAILY 90 capsule 3  . diphenhydramine-acetaminophen (TYLENOL PM) 25-500 MG TABS tablet  Take 1 tablet by mouth at bedtime as needed (sleep).    . feeding supplement, GLUCERNA SHAKE, (GLUCERNA SHAKE) LIQD Take 237 mLs by mouth 3 (three) times daily between meals. (Patient taking differently: Take 237 mLs by mouth 2 (two) times daily between meals. ) 90 Can 0  . FLUoxetine (PROZAC) 20 MG capsule TAKE 3 CAPSULES BY MOUTH DAILY 90 capsule 5  . furosemide (LASIX) 20 MG tablet Take 1 tablet (20 mg total) by mouth daily. 20 tablet 0  . Insulin Pen Needle (UNIFINE PENTIPS) 32G X 4 MM MISC Use to inject insulin once a day. The patient is insulin requiring, ICD 10 code E11.9. The patient injects 1 times per day. 100 each 2  . losartan (COZAAR) 100 MG tablet Take 1 tablet (100 mg total) by  mouth daily. 90 tablet 1  . metFORMIN (GLUCOPHAGE) 1000 MG tablet Take 1 tablet (1,000 mg total) by mouth 2 (two) times daily with a meal. 180 tablet 3  . omeprazole (PRILOSEC) 20 MG capsule TAKE 1 CAPSULE BY MOUTH EVERY DAY 90 capsule 3  . potassium chloride SA (K-DUR) 20 MEQ tablet Take 1 tablet (20 mEq total) by mouth daily. Take along with lasix 20 tablet 0  . tolnaftate (TINACTIN) 1 % cream Apply 1 application topically 2 (two) times daily. 30 g 0  . traZODone (DESYREL) 50 MG tablet Take 2 tablets (100 mg total) by mouth at bedtime. 30 tablet 5  . TRULICITY 2.53 GU/4.4IH SOPN INJECT 0.75 MG INTO THE SKIN ONCE A WEEK. 6 mL 1  . vitamin C (ASCORBIC ACID) 500 MG tablet Take 500 mg by mouth every morning.    . gabapentin (NEURONTIN) 400 MG capsule TAKE 1 CAPSULE BY MOUTH THREE TIMES A DAY 90 capsule 2   No current facility-administered medications for this visit.    PHYSICAL EXAMINATION: ECOG PERFORMANCE STATUS: 3 - Symptomatic, >50% confined to bed  Vitals:   05/15/19 1514 05/15/19 1515  BP: (!) 196/102 (!) 164/94  Pulse: 99   Resp: 19   Temp: 98.3 F (36.8 C)   SpO2: 99%    Filed Weights   05/15/19 1514  Weight: 190 lb 11.2 oz (86.5 kg)    GENERAL:alert, no distress and comfortable SKIN: skin color, texture, turgor are normal, no rashes or significant lesions EYES: normal, Conjunctiva are pink and non-injected, sclera clear  NECK: supple, thyroid normal size, non-tender, without nodularity LYMPH:  no palpable lymphadenopathy in the cervical, axillary  LUNGS: clear to auscultation and percussion with normal breathing effort HEART: regular rate & rhythm and no murmurs and no lower extremity edema ABDOMEN:abdomen soft, non-tender and normal bowel sounds Musculoskeletal:no cyanosis of digits and no clubbing  NEURO: alert & oriented x 3 with fluent speech, no focal motor/sensory deficits  LABORATORY DATA:  I have reviewed the data as listed CBC Latest Ref Rng & Units  05/15/2019 04/13/2019 03/11/2019  WBC 4.0 - 10.5 K/uL 8.8 7.7 9.3  Hemoglobin 12.0 - 15.0 g/dL 10.1(L) 9.9(L) 10.3(L)  Hematocrit 36.0 - 46.0 % 31.6(L) 31.2(L) 32.9(L)  Platelets 150 - 400 K/uL 264 407(H) 336     CMP Latest Ref Rng & Units 04/13/2019 02/23/2019 02/04/2019  Glucose 70 - 99 mg/dL 88 113(H) 100(H)  BUN 8 - 23 mg/dL 16 15 45(H)  Creatinine 0.44 - 1.00 mg/dL 1.04(H) 1.14(H) 1.78(H)  Sodium 135 - 145 mmol/L 141 141 134  Potassium 3.5 - 5.1 mmol/L 4.2 4.0 6.0(H)  Chloride 98 - 111 mmol/L 105 105 93(L)  CO2 22 - 32 mmol/L '24 24 22  '$ Calcium 8.9 - 10.3 mg/dL 8.3(L) 9.0 9.7  Total Protein 6.5 - 8.1 g/dL - - -  Total Bilirubin 0.3 - 1.2 mg/dL - - -  Alkaline Phos 38 - 126 U/L - - -  AST 15 - 41 U/L - - -  ALT 0 - 44 U/L - - -      RADIOGRAPHIC STUDIES: I have personally reviewed the radiological images as listed and agreed with the findings in the report. No results found.   ASSESSMENT & PLAN:  [name] is a 68 y.o. female with    1. Iron deficiency anemia secondary to GIbloodloss, and Anemia of Chronic Disease  -History of multiple GI bleeding in 2016, required blood transfusion, hospitalization and IV Feraheme -She previously required blood transfusion periodically, much less lately  -She has been receiving IV Feraheme every1-4monthon average lately. Shehad a moderate response to IV iron recently.  -She has been on oral iron for 5-6 years now and has had black watery stool.  -She had an EGD and colonoscopy on 10/29/2017 and 03/17/2018 with Dr. DLoletha Carrow which was normal.  -[facility] bowel. I discussed if she experience more GI bleeding, [facility] -She continues to respond well to IV Feraheme. Goal is to keep her Hg >10and ferritin>100. Last IV Feraheme on 04/27/19 -She notes her anemia does not respond completely, she probably has component of anemia of  chronic disease. Her Hg improved to 10.1 today after recent two dose iv feraheme. Iron panel still pending. No need for blood transfusion. May give IV iron next week based on iron level.  -Continue to check her labsevery 4weeks, and will set up iv feraheme with goal ferritin >100. -f/u in 4 months.   2. Porphyria cutaneoustarda (PCT), resolvedafter Hep C Treatment  3.Chronic hepatitis C infection -She previously tested positive for hepatitis C in 2009. She has previously completed treatment for hepatitis C in 2016 and repeated test was negative.  -She had an EGD and colonoscopy on 10/29/2017 and 03/17/2018 with Dr. DLoletha Carrow which was normal. She will continue to follow up with him.   -05[place]. She is overdue for AFP.   4. DM2 -Management per PCP's office. -On Lantus, Metformin -Was hospitalized in March 2018 for high glucose levels greater than 1000.  -Continue to follow up closely with PCP  5. Left BKA, hasprosthesis, she is wheelchair bound most of time  6. Smoking cessation  -She is still actively smoking,she isnot ready to quit, however, she knows that she needs to quit  7. Right knee pain -likely secondary to osteoarthritis. She will follow up with PCP     Plan -Lab reviewed, Hg 10.1. No blood transfusions,  -IV Feraheme 12/23 if ferritin <100 (will give two doses if ferritin<30) -F/u in 4 months   No problem-specific Assessment & Plan notes found for this encounter.   No orders of the defined types were placed in this encounter.  All questions were answered. The patient knows to call the clinic with any problems, questions or concerns. No barriers to learning was detected. I spent 15 minutes counseling the patient face to face. The total time spent in the appointment was 20 minutes and more than 50% was on counseling and review of test results     YTruitt Merle MD 05/15/2019  Oneal Deputy, am acting as scribe for Truitt Merle, MD.   I have reviewed the above documentation for accuracy and completeness, and I agree with the above.

## 2019-05-15 ENCOUNTER — Encounter: Payer: Self-pay | Admitting: Hematology

## 2019-05-15 ENCOUNTER — Other Ambulatory Visit: Payer: Self-pay | Admitting: Internal Medicine

## 2019-05-15 ENCOUNTER — Inpatient Hospital Stay: Payer: Medicare Other | Attending: Nurse Practitioner

## 2019-05-15 ENCOUNTER — Other Ambulatory Visit: Payer: Self-pay

## 2019-05-15 ENCOUNTER — Inpatient Hospital Stay (HOSPITAL_BASED_OUTPATIENT_CLINIC_OR_DEPARTMENT_OTHER): Payer: Medicare Other | Admitting: Hematology

## 2019-05-15 VITALS — BP 164/94 | HR 99 | Temp 98.3°F | Resp 19 | Ht 61.0 in | Wt 190.7 lb

## 2019-05-15 DIAGNOSIS — Z89512 Acquired absence of left leg below knee: Secondary | ICD-10-CM | POA: Insufficient documentation

## 2019-05-15 DIAGNOSIS — Z993 Dependence on wheelchair: Secondary | ICD-10-CM | POA: Diagnosis not present

## 2019-05-15 DIAGNOSIS — F17210 Nicotine dependence, cigarettes, uncomplicated: Secondary | ICD-10-CM | POA: Diagnosis not present

## 2019-05-15 DIAGNOSIS — M25561 Pain in right knee: Secondary | ICD-10-CM | POA: Insufficient documentation

## 2019-05-15 DIAGNOSIS — E1140 Type 2 diabetes mellitus with diabetic neuropathy, unspecified: Secondary | ICD-10-CM | POA: Insufficient documentation

## 2019-05-15 DIAGNOSIS — Z8619 Personal history of other infectious and parasitic diseases: Secondary | ICD-10-CM

## 2019-05-15 DIAGNOSIS — K922 Gastrointestinal hemorrhage, unspecified: Secondary | ICD-10-CM | POA: Diagnosis not present

## 2019-05-15 DIAGNOSIS — I10 Essential (primary) hypertension: Secondary | ICD-10-CM

## 2019-05-15 DIAGNOSIS — Z79899 Other long term (current) drug therapy: Secondary | ICD-10-CM | POA: Insufficient documentation

## 2019-05-15 DIAGNOSIS — S78112A Complete traumatic amputation at level between left hip and knee, initial encounter: Secondary | ICD-10-CM

## 2019-05-15 DIAGNOSIS — D500 Iron deficiency anemia secondary to blood loss (chronic): Secondary | ICD-10-CM

## 2019-05-15 DIAGNOSIS — B182 Chronic viral hepatitis C: Secondary | ICD-10-CM | POA: Diagnosis not present

## 2019-05-15 LAB — CBC WITH DIFFERENTIAL (CANCER CENTER ONLY)
Abs Immature Granulocytes: 0.07 10*3/uL (ref 0.00–0.07)
Basophils Absolute: 0 10*3/uL (ref 0.0–0.1)
Basophils Relative: 0 %
Eosinophils Absolute: 0.1 10*3/uL (ref 0.0–0.5)
Eosinophils Relative: 1 %
HCT: 31.6 % — ABNORMAL LOW (ref 36.0–46.0)
Hemoglobin: 10.1 g/dL — ABNORMAL LOW (ref 12.0–15.0)
Immature Granulocytes: 1 %
Lymphocytes Relative: 16 %
Lymphs Abs: 1.4 10*3/uL (ref 0.7–4.0)
MCH: 30.6 pg (ref 26.0–34.0)
MCHC: 32 g/dL (ref 30.0–36.0)
MCV: 95.8 fL (ref 80.0–100.0)
Monocytes Absolute: 1 10*3/uL (ref 0.1–1.0)
Monocytes Relative: 11 %
Neutro Abs: 6.2 10*3/uL (ref 1.7–7.7)
Neutrophils Relative %: 71 %
Platelet Count: 264 10*3/uL (ref 150–400)
RBC: 3.3 MIL/uL — ABNORMAL LOW (ref 3.87–5.11)
RDW: 18.9 % — ABNORMAL HIGH (ref 11.5–15.5)
WBC Count: 8.8 10*3/uL (ref 4.0–10.5)
nRBC: 0 % (ref 0.0–0.2)

## 2019-05-18 ENCOUNTER — Telehealth: Payer: Self-pay

## 2019-05-18 ENCOUNTER — Telehealth: Payer: Self-pay | Admitting: Hematology

## 2019-05-18 LAB — IRON AND TIBC
Iron: 64 ug/dL (ref 41–142)
Saturation Ratios: 20 % — ABNORMAL LOW (ref 21–57)
TIBC: 325 ug/dL (ref 236–444)
UIBC: 261 ug/dL (ref 120–384)

## 2019-05-18 LAB — FERRITIN: Ferritin: 123 ng/mL (ref 11–307)

## 2019-05-18 NOTE — Telephone Encounter (Signed)
Spoke with patient per Dr. Burr Medico, iron level is good this time, does not need infusion on 12/23.  Patient verbalized an understanding.  Infusion appointment was cancelled.

## 2019-05-18 NOTE — Telephone Encounter (Signed)
-----   Message from Truitt Merle, MD sent at 05/18/2019 10:08 AM EST ----- Please let pt know that her iron level is good this time, OK to cancel her infusion on 12/23. Thanks   Truitt Merle  05/18/2019

## 2019-05-18 NOTE — Telephone Encounter (Signed)
Scheduled appt per 12/18 los.  Spoke with pt and she is aware of her appt dates and time.

## 2019-05-20 ENCOUNTER — Ambulatory Visit: Payer: Medicare Other

## 2019-05-25 MED FILL — TRULICITY 0.75 MG/0.5 ML PE: 0.75 | 28 days supply | Qty: 2 | Fill #1

## 2019-06-01 ENCOUNTER — Telehealth: Payer: Self-pay

## 2019-06-01 NOTE — Telephone Encounter (Signed)
Patient requested calendar of her lab appointments be mailed to home address, I mailed Jan/Feb/March 2021 to her home address on file.

## 2019-06-15 ENCOUNTER — Inpatient Hospital Stay: Payer: Medicare Other | Attending: Nurse Practitioner

## 2019-06-15 ENCOUNTER — Other Ambulatory Visit: Payer: Self-pay

## 2019-06-15 DIAGNOSIS — Z79899 Other long term (current) drug therapy: Secondary | ICD-10-CM | POA: Diagnosis not present

## 2019-06-15 DIAGNOSIS — D500 Iron deficiency anemia secondary to blood loss (chronic): Secondary | ICD-10-CM | POA: Diagnosis not present

## 2019-06-15 LAB — CBC WITH DIFFERENTIAL (CANCER CENTER ONLY)
Abs Immature Granulocytes: 0.06 10*3/uL (ref 0.00–0.07)
Basophils Absolute: 0 10*3/uL (ref 0.0–0.1)
Basophils Relative: 0 %
Eosinophils Absolute: 0.2 10*3/uL (ref 0.0–0.5)
Eosinophils Relative: 2 %
HCT: 30.3 % — ABNORMAL LOW (ref 36.0–46.0)
Hemoglobin: 9.6 g/dL — ABNORMAL LOW (ref 12.0–15.0)
Immature Granulocytes: 1 %
Lymphocytes Relative: 18 %
Lymphs Abs: 1.6 10*3/uL (ref 0.7–4.0)
MCH: 28.9 pg (ref 26.0–34.0)
MCHC: 31.7 g/dL (ref 30.0–36.0)
MCV: 91.3 fL (ref 80.0–100.0)
Monocytes Absolute: 1 10*3/uL (ref 0.1–1.0)
Monocytes Relative: 11 %
Neutro Abs: 6.1 10*3/uL (ref 1.7–7.7)
Neutrophils Relative %: 68 %
Platelet Count: 412 10*3/uL — ABNORMAL HIGH (ref 150–400)
RBC: 3.32 MIL/uL — ABNORMAL LOW (ref 3.87–5.11)
RDW: 16.3 % — ABNORMAL HIGH (ref 11.5–15.5)
WBC Count: 8.8 10*3/uL (ref 4.0–10.5)
nRBC: 0 % (ref 0.0–0.2)

## 2019-06-15 LAB — IRON AND TIBC
Iron: 23 ug/dL — ABNORMAL LOW (ref 41–142)
Saturation Ratios: 6 % — ABNORMAL LOW (ref 21–57)
TIBC: 398 ug/dL (ref 236–444)
UIBC: 375 ug/dL (ref 120–384)

## 2019-06-15 LAB — FERRITIN: Ferritin: 12 ng/mL (ref 11–307)

## 2019-06-15 MED FILL — TRULICITY 0.75 MG/0.5 ML PE: 0.75 | 28 days supply | Qty: 2 | Fill #2

## 2019-06-16 ENCOUNTER — Encounter: Payer: Self-pay | Admitting: Hematology

## 2019-06-17 ENCOUNTER — Telehealth: Payer: Self-pay | Admitting: Hematology

## 2019-06-17 NOTE — Telephone Encounter (Signed)
Scheduled appt per 1/19 sch message pt aware of appt date and time   

## 2019-06-18 DIAGNOSIS — G933 Postviral fatigue syndrome: Secondary | ICD-10-CM | POA: Diagnosis not present

## 2019-06-19 DIAGNOSIS — G933 Postviral fatigue syndrome: Secondary | ICD-10-CM | POA: Diagnosis not present

## 2019-06-23 ENCOUNTER — Inpatient Hospital Stay: Payer: Medicare Other

## 2019-06-23 ENCOUNTER — Other Ambulatory Visit: Payer: Self-pay

## 2019-06-23 VITALS — BP 163/93 | HR 76 | Temp 97.6°F | Resp 20

## 2019-06-23 DIAGNOSIS — D500 Iron deficiency anemia secondary to blood loss (chronic): Secondary | ICD-10-CM | POA: Diagnosis not present

## 2019-06-23 DIAGNOSIS — K922 Gastrointestinal hemorrhage, unspecified: Secondary | ICD-10-CM

## 2019-06-23 DIAGNOSIS — Z79899 Other long term (current) drug therapy: Secondary | ICD-10-CM | POA: Diagnosis not present

## 2019-06-23 MED ORDER — FAMOTIDINE 20 MG PO TABS
ORAL_TABLET | ORAL | Status: AC
  Filled 2019-06-23: qty 1

## 2019-06-23 MED ORDER — FAMOTIDINE 20 MG PO TABS
20.0000 mg | ORAL_TABLET | Freq: Once | ORAL | Status: AC
  Administered 2019-06-23: 20 mg via ORAL

## 2019-06-23 MED ORDER — SODIUM CHLORIDE 0.9 % IV SOLN
Freq: Once | INTRAVENOUS | Status: AC
  Filled 2019-06-23: qty 250

## 2019-06-23 MED ORDER — SODIUM CHLORIDE 0.9 % IV SOLN
510.0000 mg | Freq: Once | INTRAVENOUS | Status: AC
  Administered 2019-06-23: 510 mg via INTRAVENOUS
  Filled 2019-06-23: qty 17

## 2019-06-23 NOTE — Patient Instructions (Signed)
Ferumoxytol injection What is this medicine? FERUMOXYTOL is an iron complex. Iron is used to make healthy red blood cells, which carry oxygen and nutrients throughout the body. This medicine is used to treat iron deficiency anemia. This medicine may be used for other purposes; ask your health care provider or pharmacist if you have questions. COMMON BRAND NAME(S): Feraheme What should I tell my health care provider before I take this medicine? They need to know if you have any of these conditions:  anemia not caused by low iron levels  high levels of iron in the blood  magnetic resonance imaging (MRI) test scheduled  an unusual or allergic reaction to iron, other medicines, foods, dyes, or preservatives  pregnant or trying to get pregnant  breast-feeding How should I use this medicine? This medicine is for injection into a vein. It is given by a health care professional in a hospital or clinic setting. Talk to your pediatrician regarding the use of this medicine in children. Special care may be needed. Overdosage: If you think you have taken too much of this medicine contact a poison control center or emergency room at once. NOTE: This medicine is only for you. Do not share this medicine with others. What if I miss a dose? It is important not to miss your dose. Call your doctor or health care professional if you are unable to keep an appointment. What may interact with this medicine? This medicine may interact with the following medications:  other iron products This list may not describe all possible interactions. Give your health care provider a list of all the medicines, herbs, non-prescription drugs, or dietary supplements you use. Also tell them if you smoke, drink alcohol, or use illegal drugs. Some items may interact with your medicine. What should I watch for while using this medicine? Visit your doctor or healthcare professional regularly. Tell your doctor or healthcare  professional if your symptoms do not start to get better or if they get worse. You may need blood work done while you are taking this medicine. You may need to follow a special diet. Talk to your doctor. Foods that contain iron include: whole grains/cereals, dried fruits, beans, or peas, leafy green vegetables, and organ meats (liver, kidney). What side effects may I notice from receiving this medicine? Side effects that you should report to your doctor or health care professional as soon as possible:  allergic reactions like skin rash, itching or hives, swelling of the face, lips, or tongue  breathing problems  changes in blood pressure  feeling faint or lightheaded, falls  fever or chills  flushing, sweating, or hot feelings  swelling of the ankles or feet Side effects that usually do not require medical attention (report to your doctor or health care professional if they continue or are bothersome):  diarrhea  headache  nausea, vomiting  stomach pain This list may not describe all possible side effects. Call your doctor for medical advice about side effects. You may report side effects to FDA at 1-800-FDA-1088. Where should I keep my medicine? This drug is given in a hospital or clinic and will not be stored at home. NOTE: This sheet is a summary. It may not cover all possible information. If you have questions about this medicine, talk to your doctor, pharmacist, or health care provider.  2020 Elsevier/Gold Standard (2016-07-02 20:21:10) Coronavirus (COVID-19) Are you at risk?  Are you at risk for the Coronavirus (COVID-19)?  To be considered HIGH RISK for Coronavirus (COVID-19),   you have to meet the following criteria:  . Traveled to [place], [place], [place], [place] or [place]; or in the [place] to [place], [place], [place], or [place]; and have fever, cough, and shortness of breath within the last 2 weeks of travel OR . Been in close contact with a person  diagnosed with COVID-19 within the last 2 weeks and have fever, cough, and shortness of breath . IF YOU DO NOT MEET THESE CRITERIA, YOU ARE CONSIDERED LOW RISK FOR COVID-19.  What to do if you are HIGH RISK for COVID-19?  . If you are having a medical emergency, call 911. . Seek medical care right away. Before you go to a doctor's office, urgent care or emergency department, call ahead and tell them about your recent travel, contact with someone diagnosed with COVID-19, and your symptoms. You should receive instructions from your physician's office regarding next steps of care.  . When you arrive at healthcare provider, tell the healthcare staff immediately you have returned from visiting [place], [place], [place], [place] or [place]; or traveled in the [place] to [place], [place], [place], or [place]; in the last two weeks or you have been in close contact with a person diagnosed with COVID-19 in the last 2 weeks.   . Tell the health care staff about your symptoms: fever, cough and shortness of breath. . After you have been seen by a medical provider, you will be either: o Tested for (COVID-19) and discharged home on quarantine except to seek medical care if symptoms worsen, and asked to  - Stay home and avoid contact with others until you get your results (4-5 days)  - Avoid travel on public transportation if possible (such as bus, train, or airplane) or o Sent to the Emergency Department by EMS for evaluation, COVID-19 testing, and possible admission depending on your condition and test results.  What to do if you are LOW RISK for COVID-19?  Reduce your risk of any infection by using the same precautions used for avoiding the common cold or flu:  . Wash your hands often with soap and warm water for at least 20 seconds.  If soap and water are not readily available, use an alcohol-based hand sanitizer with at least 60% alcohol.  . If coughing or sneezing, cover your mouth and nose by  coughing or sneezing into the elbow areas of your shirt or coat, into a tissue or into your sleeve (not your hands). . Avoid shaking hands with others and consider head nods or verbal greetings only. . Avoid touching your eyes, nose, or mouth with unwashed hands.  . Avoid close contact with people who are sick. . Avoid places or events with large numbers of people in one location, like concerts or sporting events. . Carefully consider travel plans you have or are making. . If you are planning any travel outside or inside the [place], visit the CDC's Travelers' Health webpage for the latest health notices. . If you have some symptoms but not all symptoms, continue to monitor at home and seek medical attention if your symptoms worsen. . If you are having a medical emergency, call 911.   ADDITIONAL HEALTHCARE OPTIONS FOR PATIENTS  [facility] / e-Visit: [email or web address]         [facility]: [phone]  [facility]: [phone]                   [facility]: [phone]   

## 2019-06-24 ENCOUNTER — Telehealth: Payer: Self-pay | Admitting: Hematology

## 2019-06-24 NOTE — Telephone Encounter (Signed)
Returned patient's phone call regarding rescheduling February appointment times, per patient's request appointment times have been rescheduled.

## 2019-06-30 ENCOUNTER — Ambulatory Visit: Payer: Medicare Other

## 2019-06-30 ENCOUNTER — Inpatient Hospital Stay: Payer: Medicare Other | Attending: Nurse Practitioner

## 2019-06-30 ENCOUNTER — Other Ambulatory Visit: Payer: Self-pay

## 2019-06-30 VITALS — BP 135/73 | HR 78 | Temp 98.3°F | Resp 16

## 2019-06-30 DIAGNOSIS — Z79899 Other long term (current) drug therapy: Secondary | ICD-10-CM | POA: Diagnosis not present

## 2019-06-30 DIAGNOSIS — K922 Gastrointestinal hemorrhage, unspecified: Secondary | ICD-10-CM

## 2019-06-30 DIAGNOSIS — D500 Iron deficiency anemia secondary to blood loss (chronic): Secondary | ICD-10-CM

## 2019-06-30 MED ORDER — SODIUM CHLORIDE 0.9 % IV SOLN
INTRAVENOUS | Status: DC
  Filled 2019-06-30: qty 250

## 2019-06-30 MED ORDER — SODIUM CHLORIDE 0.9 % IV SOLN
510.0000 mg | Freq: Once | INTRAVENOUS | Status: AC
  Administered 2019-06-30: 10:00:00 510 mg via INTRAVENOUS
  Filled 2019-06-30: qty 510

## 2019-06-30 MED ORDER — FAMOTIDINE 20 MG PO TABS
20.0000 mg | ORAL_TABLET | Freq: Once | ORAL | Status: AC
  Administered 2019-06-30: 09:00:00 20 mg via ORAL

## 2019-06-30 NOTE — Patient Instructions (Signed)
Ferumoxytol injection What is this medicine? FERUMOXYTOL is an iron complex. Iron is used to make healthy red blood cells, which carry oxygen and nutrients throughout the body. This medicine is used to treat iron deficiency anemia. This medicine may be used for other purposes; ask your health care provider or pharmacist if you have questions. COMMON BRAND NAME(S): Feraheme What should I tell my health care provider before I take this medicine? They need to know if you have any of these conditions:  anemia not caused by low iron levels  high levels of iron in the blood  magnetic resonance imaging (MRI) test scheduled  an unusual or allergic reaction to iron, other medicines, foods, dyes, or preservatives  pregnant or trying to get pregnant  breast-feeding How should I use this medicine? This medicine is for injection into a vein. It is given by a health care professional in a hospital or clinic setting. Talk to your pediatrician regarding the use of this medicine in children. Special care may be needed. Overdosage: If you think you have taken too much of this medicine contact a poison control center or emergency room at once. NOTE: This medicine is only for you. Do not share this medicine with others. What if I miss a dose? It is important not to miss your dose. Call your doctor or health care professional if you are unable to keep an appointment. What may interact with this medicine? This medicine may interact with the following medications:  other iron products This list may not describe all possible interactions. Give your health care provider a list of all the medicines, herbs, non-prescription drugs, or dietary supplements you use. Also tell them if you smoke, drink alcohol, or use illegal drugs. Some items may interact with your medicine. What should I watch for while using this medicine? Visit your doctor or healthcare professional regularly. Tell your doctor or healthcare  professional if your symptoms do not start to get better or if they get worse. You may need blood work done while you are taking this medicine. You may need to follow a special diet. Talk to your doctor. Foods that contain iron include: whole grains/cereals, dried fruits, beans, or peas, leafy green vegetables, and organ meats (liver, kidney). What side effects may I notice from receiving this medicine? Side effects that you should report to your doctor or health care professional as soon as possible:  allergic reactions like skin rash, itching or hives, swelling of the face, lips, or tongue  breathing problems  changes in blood pressure  feeling faint or lightheaded, falls  fever or chills  flushing, sweating, or hot feelings  swelling of the ankles or feet Side effects that usually do not require medical attention (report to your doctor or health care professional if they continue or are bothersome):  diarrhea  headache  nausea, vomiting  stomach pain This list may not describe all possible side effects. Call your doctor for medical advice about side effects. You may report side effects to FDA at 1-800-FDA-1088. Where should I keep my medicine? This drug is given in a hospital or clinic and will not be stored at home. NOTE: This sheet is a summary. It may not cover all possible information. If you have questions about this medicine, talk to your doctor, pharmacist, or health care provider.  2020 Elsevier/Gold Standard (2016-07-02 20:21:10) Coronavirus (COVID-19) Are you at risk?  Are you at risk for the Coronavirus (COVID-19)?  To be considered HIGH RISK for Coronavirus (COVID-19),   you have to meet the following criteria:  . Traveled to [place], [place], [place], [place] or [place]; or in the [place] to [place], [place], [place], or [place]; and have fever, cough, and shortness of breath within the last 2 weeks of travel OR . Been in close contact with a person  diagnosed with COVID-19 within the last 2 weeks and have fever, cough, and shortness of breath . IF YOU DO NOT MEET THESE CRITERIA, YOU ARE CONSIDERED LOW RISK FOR COVID-19.  What to do if you are HIGH RISK for COVID-19?  . If you are having a medical emergency, call 911. . Seek medical care right away. Before you go to a doctor's office, urgent care or emergency department, call ahead and tell them about your recent travel, contact with someone diagnosed with COVID-19, and your symptoms. You should receive instructions from your physician's office regarding next steps of care.  . When you arrive at healthcare provider, tell the healthcare staff immediately you have returned from visiting [place], [place], [place], [place] or [place]; or traveled in the [place] to [place], [place], [place], or [place]; in the last two weeks or you have been in close contact with a person diagnosed with COVID-19 in the last 2 weeks.   . Tell the health care staff about your symptoms: fever, cough and shortness of breath. . After you have been seen by a medical provider, you will be either: o Tested for (COVID-19) and discharged home on quarantine except to seek medical care if symptoms worsen, and asked to  - Stay home and avoid contact with others until you get your results (4-5 days)  - Avoid travel on public transportation if possible (such as bus, train, or airplane) or o Sent to the Emergency Department by EMS for evaluation, COVID-19 testing, and possible admission depending on your condition and test results.  What to do if you are LOW RISK for COVID-19?  Reduce your risk of any infection by using the same precautions used for avoiding the common cold or flu:  . Wash your hands often with soap and warm water for at least 20 seconds.  If soap and water are not readily available, use an alcohol-based hand sanitizer with at least 60% alcohol.  . If coughing or sneezing, cover your mouth and nose by  coughing or sneezing into the elbow areas of your shirt or coat, into a tissue or into your sleeve (not your hands). . Avoid shaking hands with others and consider head nods or verbal greetings only. . Avoid touching your eyes, nose, or mouth with unwashed hands.  . Avoid close contact with people who are sick. . Avoid places or events with large numbers of people in one location, like concerts or sporting events. . Carefully consider travel plans you have or are making. . If you are planning any travel outside or inside the [place], visit the CDC's Travelers' Health webpage for the latest health notices. . If you have some symptoms but not all symptoms, continue to monitor at home and seek medical attention if your symptoms worsen. . If you are having a medical emergency, call 911.   ADDITIONAL HEALTHCARE OPTIONS FOR PATIENTS  [facility] / e-Visit: [email or web address]         [facility]: [phone]  [facility]: [phone]                   [facility]: [phone]   

## 2019-07-14 ENCOUNTER — Other Ambulatory Visit: Payer: Self-pay

## 2019-07-14 NOTE — Patient Outreach (Signed)
[facility]) Care Management  07/14/2019  [name] 12-16-1950 [id number]   Medication Adherence call to [name] spoke with patient she did not want to engage patient wants a call back at a later day. [name] is showing past due on Trulicity under [place].  [place] Management Direct Dial [phone]  Fax [phone] [email or web address]

## 2019-07-15 ENCOUNTER — Ambulatory Visit (INDEPENDENT_AMBULATORY_CARE_PROVIDER_SITE_OTHER): Payer: Medicare Other | Admitting: Internal Medicine

## 2019-07-15 ENCOUNTER — Other Ambulatory Visit: Payer: Self-pay

## 2019-07-15 ENCOUNTER — Encounter: Payer: Self-pay | Admitting: Internal Medicine

## 2019-07-15 ENCOUNTER — Telehealth: Payer: Self-pay | Admitting: Hematology

## 2019-07-15 VITALS — BP 157/87 | HR 82 | Temp 98.4°F | Wt 186.4 lb

## 2019-07-15 DIAGNOSIS — Q2733 Arteriovenous malformation of digestive system vessel: Secondary | ICD-10-CM

## 2019-07-15 DIAGNOSIS — R3915 Urgency of urination: Secondary | ICD-10-CM

## 2019-07-15 DIAGNOSIS — R319 Hematuria, unspecified: Secondary | ICD-10-CM

## 2019-07-15 DIAGNOSIS — R32 Unspecified urinary incontinence: Secondary | ICD-10-CM

## 2019-07-15 DIAGNOSIS — Z79899 Other long term (current) drug therapy: Secondary | ICD-10-CM

## 2019-07-15 DIAGNOSIS — E119 Type 2 diabetes mellitus without complications: Secondary | ICD-10-CM | POA: Diagnosis not present

## 2019-07-15 DIAGNOSIS — Z794 Long term (current) use of insulin: Secondary | ICD-10-CM

## 2019-07-15 DIAGNOSIS — Z789 Other specified health status: Secondary | ICD-10-CM

## 2019-07-15 DIAGNOSIS — N289 Disorder of kidney and ureter, unspecified: Secondary | ICD-10-CM

## 2019-07-15 DIAGNOSIS — Z7289 Other problems related to lifestyle: Secondary | ICD-10-CM

## 2019-07-15 DIAGNOSIS — R3129 Other microscopic hematuria: Secondary | ICD-10-CM | POA: Diagnosis not present

## 2019-07-15 DIAGNOSIS — F1990 Other psychoactive substance use, unspecified, uncomplicated: Secondary | ICD-10-CM

## 2019-07-15 DIAGNOSIS — F411 Generalized anxiety disorder: Secondary | ICD-10-CM

## 2019-07-15 DIAGNOSIS — G933 Postviral fatigue syndrome: Secondary | ICD-10-CM | POA: Diagnosis not present

## 2019-07-15 DIAGNOSIS — F1490 Cocaine use, unspecified, uncomplicated: Secondary | ICD-10-CM

## 2019-07-15 DIAGNOSIS — R350 Frequency of micturition: Secondary | ICD-10-CM | POA: Diagnosis not present

## 2019-07-15 DIAGNOSIS — F1290 Cannabis use, unspecified, uncomplicated: Secondary | ICD-10-CM

## 2019-07-15 DIAGNOSIS — E1140 Type 2 diabetes mellitus with diabetic neuropathy, unspecified: Secondary | ICD-10-CM

## 2019-07-15 DIAGNOSIS — Z7984 Long term (current) use of oral hypoglycemic drugs: Secondary | ICD-10-CM

## 2019-07-15 DIAGNOSIS — I10 Essential (primary) hypertension: Secondary | ICD-10-CM

## 2019-07-15 DIAGNOSIS — D500 Iron deficiency anemia secondary to blood loss (chronic): Secondary | ICD-10-CM

## 2019-07-15 DIAGNOSIS — R300 Dysuria: Secondary | ICD-10-CM

## 2019-07-15 LAB — POCT URINALYSIS DIPSTICK
Bilirubin, UA: NEGATIVE
Glucose, UA: NEGATIVE
Ketones, UA: NEGATIVE
Leukocytes, UA: NEGATIVE
Nitrite, UA: NEGATIVE
Protein, UA: POSITIVE — AB
Spec Grav, UA: 1.025 (ref 1.010–1.025)
Urobilinogen, UA: 0.2 E.U./dL
pH, UA: 5.5 (ref 5.0–8.0)

## 2019-07-15 LAB — POCT GLYCOSYLATED HEMOGLOBIN (HGB A1C): Hemoglobin A1C: 5.1 % (ref 4.0–5.6)

## 2019-07-15 LAB — GLUCOSE, CAPILLARY: Glucose-Capillary: 108 mg/dL — ABNORMAL HIGH (ref 70–99)

## 2019-07-15 MED ORDER — TRULICITY 0.75 MG/0.5ML ~~LOC~~ SOAJ: 0.7500 mg | SUBCUTANEOUS | 3 refills | Status: DC

## 2019-07-15 MED FILL — TRULICITY 0.75 MG/0.5 ML PE: 0.75 | 84 days supply | Qty: 6 | Fill #0

## 2019-07-15 NOTE — Progress Notes (Signed)
   CC: urinary frequency and T2DM follow up  HPI:  [name] is a 69 y.o. with a PMHx of T2DM, AVM c/b IDA, HTN who presents to the clinic for urinary frequency and T2DM follow up.   Please see the Encounters tab for problem-based Assessment & Plan.  Past Medical History:  Diagnosis Date  . Allergic rhinitis   . Anemia, iron deficiency 05/03/2011   2/2 GI AVMs - recieves iron infusions as well as periodic red blood cell transfusions  . Anxiety   . Arteriovenous malformation of duodenum 02/25/2015  . CARPAL TUNNEL RELEASE, HX OF 07/22/2008  . Colon polyps   . Diabetes mellitus without complication ([place])    type 2  . Difficulty sleeping    takes trazadone for sleep  . Diverticulosis   . GERD (gastroesophageal reflux disease)   . Hepatitis C    in SVR with [facility] 12/2014-02/2015.   Marland Kitchen History of hernia repair 08/18/2012  . History of porphyria 11/30/2014   Porphyria cutanea tarda in setting of chronic Hepatitis C, frequent  IV iron infusions, and alcohol abuse   . Hypertension   . Peripheral vascular disease ([place])   . Stroke [facility]) 2010   no residual deficits  . Substance use disorder    cocaine  . Tachycardia 07/10/2017   Review of Systems: Review of Systems  Constitutional: Negative for chills and fever.  Gastrointestinal: Negative for abdominal pain, diarrhea, nausea and vomiting.  Genitourinary: Positive for frequency and urgency. Negative for dysuria, flank pain and hematuria.  Neurological: Negative for dizziness and headaches.  Psychiatric/Behavioral: Positive for substance abuse. The patient is nervous/anxious.    Physical Exam:  Vitals:   07/15/19 1444  BP: (!) 157/87  Pulse: 82  Temp: 98.4 F (36.9 C)  TempSrc: Oral  SpO2: 98%  Weight: 186 lb 6.4 oz (84.6 kg)   Physical Exam Pulmonary:     Effort: Pulmonary effort is normal. No respiratory distress.  Musculoskeletal:     Right lower leg: No edema.  Skin:    General: Skin is warm and dry.   Findings: No bruising or lesion.  Neurological:     Mental Status: She is oriented to person, place, and time. Mental status is at baseline.  Psychiatric:        Attention and Perception: Attention normal.        Mood and Affect: Mood normal. Affect is tearful.        Speech: Speech normal.        Behavior: Behavior normal.        Thought Content: Thought content normal.    Assessment & Plan:   See Encounters Tab for problem based charting.  Patient discussed with Dr. Heber Newburg

## 2019-07-15 NOTE — Telephone Encounter (Signed)
Rescheduled labs from 2/18 to 2/22. Left vm with date and time of new lab appt.

## 2019-07-15 NOTE — Patient Instructions (Signed)
It was nice seeing you today! Thank you for choosing [facility] for your Primary Care.    Today we talked about:   1. Urine Incontinence: We are going to do more blood work and urine testing today to evaluate this further. I will contact you with results  2. Priority: I do not believe I need to make a referral. I would call them ASAP though. Their number is: [phone]

## 2019-07-15 NOTE — Progress Notes (Signed)
5.1 

## 2019-07-16 ENCOUNTER — Other Ambulatory Visit: Payer: Medicare Other

## 2019-07-16 ENCOUNTER — Inpatient Hospital Stay: Payer: Medicare Other

## 2019-07-16 LAB — MICROSCOPIC EXAMINATION
Bacteria, UA: NONE SEEN
Casts: NONE SEEN /lpf

## 2019-07-16 LAB — URINALYSIS, ROUTINE W REFLEX MICROSCOPIC
Bilirubin, UA: NEGATIVE
Glucose, UA: NEGATIVE
Ketones, UA: NEGATIVE
Leukocytes,UA: NEGATIVE
Nitrite, UA: NEGATIVE
Specific Gravity, UA: 1.019 (ref 1.005–1.030)
Urobilinogen, Ur: 0.2 mg/dL (ref 0.2–1.0)
pH, UA: 5 (ref 5.0–7.5)

## 2019-07-17 ENCOUNTER — Encounter: Payer: Self-pay | Admitting: Internal Medicine

## 2019-07-17 DIAGNOSIS — R3129 Other microscopic hematuria: Secondary | ICD-10-CM | POA: Insufficient documentation

## 2019-07-17 LAB — BMP8+ANION GAP
Anion Gap: 14 mmol/L (ref 10.0–18.0)
BUN/Creatinine Ratio: 13 (ref 12–28)
BUN: 13 mg/dL (ref 8–27)
CO2: 26 mmol/L (ref 20–29)
Calcium: 8.9 mg/dL (ref 8.7–10.3)
Chloride: 103 mmol/L (ref 96–106)
Creatinine, Ser: 0.97 mg/dL (ref 0.57–1.00)
GFR calc Af Amer: 70 mL/min/{1.73_m2} (ref >59)
GFR calc non Af Amer: 61 mL/min/{1.73_m2} (ref >59)
Glucose: 92 mg/dL (ref 65–99)
Potassium: 4.1 mmol/L (ref 3.5–5.2)
Sodium: 143 mmol/L (ref 134–144)

## 2019-07-17 LAB — CK: Total CK: 43 U/L (ref 32–182)

## 2019-07-17 NOTE — Assessment & Plan Note (Signed)
Blood Pressure 07/15/2019 06/30/2019 06/23/2019 05/15/2019 04/27/2019  BP 157/87 135/73 163/93 164/94 154/90  Some recent data might be hidden   [name]'s blood pressure continues to be above goal. We did not have a chance to discuss medication changes in regards to her hypertension at this visit, but will need to be addressed at her next visit. Recently this may be worsened by her acute increase in stress and relapse in substance use.  She is currently taking diltiazem and losartan.  She is maxed out on losartan but may benefit from increasing diltiazem or adding a third agent.  He was previously on HCTZ but it was stopped due to acute gout flares and patient preference.  She was also previously on atenolol, but this was stopped secondary to cocaine use.  Plan: -Discuss at next visit -Continue diltiazem and losartan

## 2019-07-17 NOTE — Assessment & Plan Note (Addendum)
[name] endorses acute worsening of her urinary urgency and frequency for the past 1 week that has led to incontinence.  She denies fever, chills, abdominal pain, dysuria, hematuria.   UA was obtained to evaluate for possible UTI for which she was negative.  However, there is evidence of microscopic hematuria with RBCs between 3-10/hpf.  Per chart review, patient has a longstanding history of hematuria dating back to at least 2018 with RBC level that is unchanged.  In addition, she does have significant proteinuria over 300 with latest UA. Past CT abdomen/pelvis imaging reviewed and no evidence of urinary or bladder abnormalities noted, but the last CT was in 2018. CK is WNL today.   Differential includes hypertensive vs diabetic nephropathy. However, will plan to refer to Nephrology for further evaluation to rule out other causes.   Plan:  - Referral to Nephrology placed.

## 2019-07-17 NOTE — Assessment & Plan Note (Signed)
[name] is very upset today to report that she has gone back to drinking liquor daily.  She feels this is secondary to the increased stress with the recent pandemic as she has also relapsed on substance use.  She is aware of the medical risks that come with drinking liquor daily and would like to work towards decreasing her anxiety so she can go decrease her alcohol intake.  Provided patient with #2 counseling center she has previously been seen at for further support

## 2019-07-17 NOTE — Assessment & Plan Note (Addendum)
Current medications: Metformin 1000 mg BID, Diraglutide (Trulicity)   123456: [id number]  Ms. Wilmes continues to do well with her medication regimen.  She is tolerating Trulicity and Metformin well.  She denies any episodes of hypoglycemia.  Of note, her A1c may be so well controlled due to her chronic blood loss with regular Feraheme infusions.  Due to this, we will hold off stopping Metformin and just continuing current regimen.  Ms. Haft states that her neuropathy has worsened since dose of gabapentin was decreased to be renally dosed.  She notes that some days she takes up to 6 tablets of the 400 mg, although her prescription is for up to 3 tablets/day.  Her creatinine has since normalized and she could be increased to a larger dose to avoid needing to use more than prescribed. However, she may get more pain relief from Lyrica since gabapentin may not be adequately controlling. She was previously on Lyrica, last in 2012 at which time she was taking 100 mg BID.  Will discuss this switch with [name]. She may also benefit Duloxetine. She has been on Fluoxetine for many years now though.   At this time, Ms. Allain also requires a new pair of diabetic shoes.  She has a prior history of foot amputation and peripheral neuropathy with callus formation.  Plan:  - Continue Trulicity and Metformin - Continue Gabapentin 400 mg TID for now  - Discuss changing to Lyrica with patient. If she is interested, plan for 75 mg TID. If not, will discuss changing gabapentin back to 800 mg TID  - Order placed for diabetic shoes

## 2019-07-17 NOTE — Assessment & Plan Note (Addendum)
Ms. Feo states that there has been an acute increase in her anxiety levels with the pandemic.  She describes herself as a social person; its been hard for her to be isolated.  She is currently taking fluoxetine 20 mg 3 times daily.  Will address medication adjustments at next visit, today focused on alcohol/substance relapses.  Plan: -Discuss fluoxetine at next visit

## 2019-07-17 NOTE — Assessment & Plan Note (Signed)
[name] is very teary today and admits to relapsing on substance use disorder.  She endorses recently using crack and marijuana.  She feels this is secondary to increased anxiety that has come with the pandemic and being forced to stay home.  She often describes herself as a very social person has been very hard to be isolated.  She is very disappointed in herself today and is motivated to restart counseling with [facility] to avoid future relapses.  It does not seem that this counseling center requires referrals, so I did provide [name] with the phone number to call to reestablish care there.

## 2019-07-17 NOTE — Assessment & Plan Note (Signed)
BMP was rechecked and creatinine continues to have remained within normal limit.  Continue to monitor as needed.

## 2019-07-17 NOTE — Assessment & Plan Note (Signed)
>>  ASSESSMENT AND PLAN FOR TYPE 2 DIABETES MELLITUS WITH DIABETIC NEUROPATHY ([facility]) WRITTEN ON 07/29/2019  5:04 PM BY ARNETT SAUNDERS, MD  Current medications: Metformin  1000 mg BID, Diraglutide (Trulicity )   A1c: 5.1%  [name] continues to do well with her medication regimen.  She is tolerating Trulicity  and Metformin  well.  She denies any episodes of hypoglycemia.  Of note, her A1c may be so well controlled due to her chronic blood loss with regular Feraheme  infusions.  Due to this, we will hold off stopping Metformin  and just continuing current regimen.  [name] states that her neuropathy has worsened since dose of gabapentin  was decreased to be renally dosed.  She notes that some days she takes up to 6 tablets of the 400 mg, although her prescription is for up to 3 tablets/day.  Her creatinine has since normalized and she could be increased to a larger dose to avoid needing to use more than prescribed. However, she may get more pain relief from Lyrica  since gabapentin  may not be adequately controlling. She was previously on Lyrica , last in 2012 at which time she was taking 100 mg BID.  Will discuss this switch with [name]. She may also benefit Duloxetine. She has been on Fluoxetine  for many years now though.   At this time, [name] also requires a new pair of diabetic shoes.  She has a prior history of foot amputation and peripheral neuropathy with callus formation.  Plan:  - Continue Trulicity  and Metformin  - Continue Gabapentin  400 mg TID for now  - Discuss changing to Lyrica  with patient. If she is interested, plan for 75 mg TID. If not, will discuss changing gabapentin  back to 800 mg TID  - Order placed for diabetic shoes

## 2019-07-20 ENCOUNTER — Inpatient Hospital Stay: Payer: Medicare Other

## 2019-07-20 ENCOUNTER — Other Ambulatory Visit: Payer: Self-pay

## 2019-07-20 ENCOUNTER — Telehealth: Payer: Self-pay | Admitting: Internal Medicine

## 2019-07-20 DIAGNOSIS — Z79899 Other long term (current) drug therapy: Secondary | ICD-10-CM | POA: Diagnosis not present

## 2019-07-20 DIAGNOSIS — D500 Iron deficiency anemia secondary to blood loss (chronic): Secondary | ICD-10-CM

## 2019-07-20 LAB — CBC WITH DIFFERENTIAL (CANCER CENTER ONLY)
Abs Immature Granulocytes: 0.06 10*3/uL (ref 0.00–0.07)
Basophils Absolute: 0 10*3/uL (ref 0.0–0.1)
Basophils Relative: 1 %
Eosinophils Absolute: 0.1 10*3/uL (ref 0.0–0.5)
Eosinophils Relative: 1 %
HCT: 34.1 % — ABNORMAL LOW (ref 36.0–46.0)
Hemoglobin: 10.9 g/dL — ABNORMAL LOW (ref 12.0–15.0)
Immature Granulocytes: 1 %
Lymphocytes Relative: 21 %
Lymphs Abs: 1.8 10*3/uL (ref 0.7–4.0)
MCH: 30.4 pg (ref 26.0–34.0)
MCHC: 32 g/dL (ref 30.0–36.0)
MCV: 95 fL (ref 80.0–100.0)
Monocytes Absolute: 0.7 10*3/uL (ref 0.1–1.0)
Monocytes Relative: 8 %
Neutro Abs: 5.9 10*3/uL (ref 1.7–7.7)
Neutrophils Relative %: 68 %
Platelet Count: 402 10*3/uL — ABNORMAL HIGH (ref 150–400)
RBC: 3.59 MIL/uL — ABNORMAL LOW (ref 3.87–5.11)
RDW: 19.1 % — ABNORMAL HIGH (ref 11.5–15.5)
WBC Count: 8.6 10*3/uL (ref 4.0–10.5)
nRBC: 0 % (ref 0.0–0.2)

## 2019-07-20 LAB — COMPREHENSIVE METABOLIC PANEL
ALT: 12 U/L (ref 0–44)
AST: 15 U/L (ref 15–41)
Albumin: 3.7 g/dL (ref 3.5–5.0)
Alkaline Phosphatase: 60 U/L (ref 38–126)
Anion gap: 12 (ref 5–15)
BUN: 16 mg/dL (ref 8–23)
CO2: 26 mmol/L (ref 22–32)
Calcium: 8.6 mg/dL — ABNORMAL LOW (ref 8.9–10.3)
Chloride: 106 mmol/L (ref 98–111)
Creatinine, Ser: 1.12 mg/dL — ABNORMAL HIGH (ref 0.44–1.00)
GFR calc Af Amer: 59 mL/min — ABNORMAL LOW (ref >60)
GFR calc non Af Amer: 51 mL/min — ABNORMAL LOW (ref >60)
Glucose, Bld: 82 mg/dL (ref 70–99)
Potassium: 3.9 mmol/L (ref 3.5–5.1)
Sodium: 144 mmol/L (ref 135–145)
Total Bilirubin: <0.2 mg/dL — ABNORMAL LOW (ref 0.3–1.2)
Total Protein: 7.2 g/dL (ref 6.5–8.1)

## 2019-07-20 LAB — IRON AND TIBC
Iron: 63 ug/dL (ref 41–142)
Saturation Ratios: 19 % — ABNORMAL LOW (ref 21–57)
TIBC: 335 ug/dL (ref 236–444)
UIBC: 272 ug/dL (ref 120–384)

## 2019-07-20 LAB — FERRITIN: Ferritin: 96 ng/mL (ref 11–307)

## 2019-07-20 MED ORDER — PREGABALIN 75 MG PO CAPS: 75.0000 mg | ORAL_CAPSULE | Freq: Three times a day (TID) | ORAL | 2 refills | Status: DC

## 2019-07-20 NOTE — Telephone Encounter (Signed)
Pt is returning physician call [phone]

## 2019-07-20 NOTE — Telephone Encounter (Signed)
Contacted [name] this afternoon to discuss referral to Nephrology due to protein and blood in urine. She is concerned about what is causing this and willing to see Nephro. She would prefer that it is near her other specialist locations, near [place] across from [place]. I instructed her to request this when contacted about the referral.   I also discussed with [name] her normalization of creatinine would allow [place] to increase her gabapentin back up to 800 mg 3 times daily, however Lyrica may provide equivocal or better control of neuropathy with lower doses.  I explained that this would be beneficial in the long-term to be on lower doses.  She is willing to try Lyrica at this time.  I instructed her to start Lyrica after finishing her current gabapentin prescription, so they do not overlap.  I will send these instructions in the mail as well, for clarification whenever it comes time to switch from gabapentin to Lyrica.  She was wondering if she should wait until she sees nephrology to make this medication change, however I counseled that it is not necessary to wait.  Plan: -Discontinue gabapentin once current bottle is finished -Start Lyrica 75 mg 3 times daily 1 day after gabapentin is finished -Will send letter with instructions

## 2019-07-21 ENCOUNTER — Encounter: Payer: Self-pay | Admitting: Hematology

## 2019-07-22 ENCOUNTER — Telehealth: Payer: Self-pay | Admitting: *Deleted

## 2019-07-22 NOTE — Progress Notes (Signed)
Internal Medicine Clinic Attending  Case discussed with Dr. Basaraba at the time of the visit.  We reviewed the resident's history and exam and pertinent patient test results.  I agree with the assessment, diagnosis, and plan of care documented in the resident's note.    

## 2019-07-22 NOTE — Telephone Encounter (Signed)
-----   Message from Alla Feeling, NP sent at 07/21/2019  1:20 PM EST ----- Please let her know her Hgb and iron improved after infusion. She does not need additional IV iron for now. Cr is little worse, please encourage her to increase oral hydration. Continue monthly labs.  Thanks, Regan Rakers

## 2019-07-22 NOTE — Telephone Encounter (Signed)
Per Cira Rue, NP, called to make pt aware that her Hgb and iron improved after infusion and there's no need for IV iron. Cr is a little worse and increase oral hydration and we will continue monthly labs. Pt was appreciative and verbalized understanding

## 2019-07-24 ENCOUNTER — Encounter: Payer: Self-pay | Admitting: Internal Medicine

## 2019-07-27 ENCOUNTER — Other Ambulatory Visit: Payer: Self-pay | Admitting: Internal Medicine

## 2019-07-27 DIAGNOSIS — G479 Sleep disorder, unspecified: Secondary | ICD-10-CM

## 2019-07-28 ENCOUNTER — Telehealth: Payer: Self-pay | Admitting: *Deleted

## 2019-07-28 NOTE — Telephone Encounter (Signed)
Received form back from the [facility] for patient's diabetic shoes. Per medicare Guidelines chart notes must accompany order. Patient was seen on 07/15/2019 for this. Will send to PCP to addend note to include need for diabetic shoes. Hubbard Hartshorn, BSN, RN-BC

## 2019-07-30 NOTE — Telephone Encounter (Signed)
Order for Diabetic shoes along with OV notes from 07/15/2019 faxed to [facility] at [phone]. Confirmation receipt received. Hubbard Hartshorn, BSN, RN-BC

## 2019-08-04 ENCOUNTER — Telehealth: Payer: Self-pay | Admitting: Internal Medicine

## 2019-08-04 NOTE — Telephone Encounter (Signed)
Pt is requesting a call back [phone]

## 2019-08-04 NOTE — Telephone Encounter (Signed)
Called pt - stated she has decided to take the covid vaccine; telephone# given to her to schedule.

## 2019-08-08 ENCOUNTER — Ambulatory Visit: Payer: Medicare Other | Attending: Internal Medicine

## 2019-08-08 DIAGNOSIS — Z23 Encounter for immunization: Secondary | ICD-10-CM

## 2019-08-08 NOTE — Progress Notes (Signed)
   Covid-19 Vaccination Clinic  Name:  [name]    MRN: [id number] DOB: 09-08-1950  08/08/2019  [name] was observed post Covid-19 immunization for 15 minutes without incident. She was provided with Vaccine Information Sheet and instruction to access the V-Safe system.   [name] was instructed to call 911 with any severe reactions post vaccine: Marland Kitchen Difficulty breathing  . Swelling of face and throat  . A fast heartbeat  . A bad rash all over body  . Dizziness and weakness   Immunizations Administered    Name Date Dose VIS Date Route   Pfizer COVID-19 Vaccine 08/08/2019 11:20 AM 0.3 mL 05/08/2019 Intramuscular   Manufacturer: [employer]   Lot: [id number]   [place]: [id number]

## 2019-08-13 ENCOUNTER — Inpatient Hospital Stay: Payer: Medicare Other | Attending: Nurse Practitioner

## 2019-08-13 ENCOUNTER — Other Ambulatory Visit: Payer: Self-pay

## 2019-08-13 DIAGNOSIS — Z79899 Other long term (current) drug therapy: Secondary | ICD-10-CM | POA: Insufficient documentation

## 2019-08-13 DIAGNOSIS — D500 Iron deficiency anemia secondary to blood loss (chronic): Secondary | ICD-10-CM | POA: Insufficient documentation

## 2019-08-13 LAB — CBC WITH DIFFERENTIAL (CANCER CENTER ONLY)
Abs Immature Granulocytes: 0.04 10*3/uL (ref 0.00–0.07)
Basophils Absolute: 0.1 10*3/uL (ref 0.0–0.1)
Basophils Relative: 1 %
Eosinophils Absolute: 0.1 10*3/uL (ref 0.0–0.5)
Eosinophils Relative: 1 %
HCT: 38 % (ref 36.0–46.0)
Hemoglobin: 12.2 g/dL (ref 12.0–15.0)
Immature Granulocytes: 0 %
Lymphocytes Relative: 14 %
Lymphs Abs: 1.3 10*3/uL (ref 0.7–4.0)
MCH: 29.2 pg (ref 26.0–34.0)
MCHC: 32.1 g/dL (ref 30.0–36.0)
MCV: 90.9 fL (ref 80.0–100.0)
Monocytes Absolute: 0.8 10*3/uL (ref 0.1–1.0)
Monocytes Relative: 9 %
Neutro Abs: 7.4 10*3/uL (ref 1.7–7.7)
Neutrophils Relative %: 75 %
Platelet Count: 338 10*3/uL (ref 150–400)
RBC: 4.18 MIL/uL (ref 3.87–5.11)
RDW: 16.2 % — ABNORMAL HIGH (ref 11.5–15.5)
WBC Count: 9.8 10*3/uL (ref 4.0–10.5)
nRBC: 0 % (ref 0.0–0.2)

## 2019-08-13 LAB — IRON AND TIBC
Iron: 33 ug/dL — ABNORMAL LOW (ref 41–142)
Saturation Ratios: 8 % — ABNORMAL LOW (ref 21–57)
TIBC: 395 ug/dL (ref 236–444)
UIBC: 362 ug/dL (ref 120–384)

## 2019-08-13 LAB — FERRITIN: Ferritin: 17 ng/mL (ref 11–307)

## 2019-08-17 ENCOUNTER — Telehealth: Payer: Self-pay | Admitting: Hematology

## 2019-08-17 ENCOUNTER — Encounter: Payer: Self-pay | Admitting: Hematology

## 2019-08-17 NOTE — Telephone Encounter (Signed)
Scheduled appt per 3/22 sch message- pt aware of appt date and time

## 2019-08-20 DIAGNOSIS — G933 Postviral fatigue syndrome: Secondary | ICD-10-CM | POA: Diagnosis not present

## 2019-08-26 ENCOUNTER — Other Ambulatory Visit: Payer: Self-pay

## 2019-08-26 ENCOUNTER — Inpatient Hospital Stay: Payer: Medicare Other

## 2019-08-26 VITALS — BP 148/81 | HR 72 | Temp 98.2°F | Resp 16

## 2019-08-26 DIAGNOSIS — D500 Iron deficiency anemia secondary to blood loss (chronic): Secondary | ICD-10-CM | POA: Diagnosis not present

## 2019-08-26 DIAGNOSIS — K922 Gastrointestinal hemorrhage, unspecified: Secondary | ICD-10-CM

## 2019-08-26 DIAGNOSIS — Z79899 Other long term (current) drug therapy: Secondary | ICD-10-CM | POA: Diagnosis not present

## 2019-08-26 MED ORDER — SODIUM CHLORIDE 0.9 % IV SOLN
INTRAVENOUS | Status: DC
  Filled 2019-08-26 (×2): qty 250

## 2019-08-26 MED ORDER — SODIUM CHLORIDE 0.9 % IV SOLN
510.0000 mg | Freq: Once | INTRAVENOUS | Status: AC
  Administered 2019-08-26: 510 mg via INTRAVENOUS
  Filled 2019-08-26: qty 510

## 2019-08-26 MED ORDER — ONDANSETRON HCL 4 MG/2ML IJ SOLN
INTRAMUSCULAR | Status: AC
  Filled 2019-08-26: qty 4

## 2019-08-26 MED ORDER — FAMOTIDINE 20 MG PO TABS
20.0000 mg | ORAL_TABLET | Freq: Once | ORAL | Status: AC
  Administered 2019-08-26: 20 mg via ORAL

## 2019-08-26 MED ORDER — FAMOTIDINE 20 MG PO TABS
ORAL_TABLET | ORAL | Status: AC
  Filled 2019-08-26: qty 1

## 2019-08-26 NOTE — Patient Instructions (Signed)

## 2019-08-27 ENCOUNTER — Other Ambulatory Visit: Payer: Self-pay | Admitting: *Deleted

## 2019-08-27 MED ORDER — OMEPRAZOLE 20 MG PO CPDR: 20.0000 mg | DELAYED_RELEASE_CAPSULE | Freq: Every day | ORAL | 1 refills | Status: DC

## 2019-09-01 ENCOUNTER — Ambulatory Visit: Payer: Medicare Other | Attending: Internal Medicine

## 2019-09-01 DIAGNOSIS — Z23 Encounter for immunization: Secondary | ICD-10-CM

## 2019-09-01 NOTE — Progress Notes (Signed)
   Covid-19 Vaccination Clinic  Name:  [name]    MRN: [id number] DOB: February 15, 1951  09/01/2019  [name] was observed post Covid-19 immunization for 30 minutes based on pre-vaccination screening without incident. She was provided with Vaccine Information Sheet and instruction to access the V-Safe system.   [name] was instructed to call 911 with any severe reactions post vaccine: Marland Kitchen Difficulty breathing  . Swelling of face and throat  . A fast heartbeat  . A bad rash all over body  . Dizziness and weakness   Immunizations Administered    Name Date Dose VIS Date Route   Pfizer COVID-19 Vaccine 09/01/2019 11:09 AM 0.3 mL 05/08/2019 Intramuscular   Manufacturer: [employer]   Lot: [id number]   [place]: [id number]

## 2019-09-09 NOTE — Progress Notes (Signed)
[facility]   [phone] [phone]   Clinic Follow up Note   Patient Care Team: Jose Persia, MD as PCP - Evalina Field, MD as Consulting Physician (Ophthalmology) Coralie Keens, MD as Consulting Physician (General Surgery) Truitt Merle, MD as Consulting Physician (Hematology) Comer, Okey Regal, MD as Consulting Physician (Infectious Diseases)  Date of Service:  09/11/2019  CHIEF COMPLAINT: F/u of Anemia  CURRENT THERAPY:  IV Feraheme as neededif ferritin <100  INTERVAL HISTORY:  [name] is here for a follow up of anemia. She presents to the clinic alone. She notes she is tired because she does not sleep well. She has difficulty falling asleep. She may sleep 3 hours at night. She does not f/u with her PCP until next month. She notes she had fall 3 days ago and bumped her right knee. She notes her right knee is mildly swollen and tender. She took tylenol but only helped some. She does not have good ROM currently at her right knee.  She notes feeling overall worse since her last IV iron. She denies black stool or bleeding, chest or stomach pain. She notes she has no appetite and has been drinking more glucerna. Her weight is stable.     REVIEW OF SYSTEMS:   Constitutional: Denies fevers, chills or abnormal weight loss (+) insomnia (+) low appetite  Eyes: Denies blurriness of vision Ears, nose, mouth, throat, and face: Denies mucositis or sore throat Respiratory: Denies cough, dyspnea or wheezes Cardiovascular: Denies palpitation, chest discomfort or lower extremity swelling Gastrointestinal:  Denies nausea, heartburn or change in bowel habits Skin: Denies abnormal skin rashes MSK: (+) Right knee swelling and tenderness, decreased ROM Lymphatics: Denies new lymphadenopathy or easy bruising Neurological:Denies numbness, tingling or new weaknesses Behavioral/Psych: Mood is stable, no new changes  All other systems were reviewed  with the patient and are negative.  MEDICAL HISTORY:  Past Medical History:  Diagnosis Date  . Allergic rhinitis   . Anemia, iron deficiency 05/03/2011   2/2 GI AVMs - recieves iron infusions as well as periodic red blood cell transfusions  . Anxiety   . Arteriovenous malformation of duodenum 02/25/2015  . CARPAL TUNNEL RELEASE, HX OF 07/22/2008  . Colon polyps   . Diabetes mellitus without complication ([place])    type 2  . Difficulty sleeping    takes trazadone for sleep  . Diverticulosis   . GERD (gastroesophageal reflux disease)   . Hepatitis C    in SVR with Harvoni 12/2014-02/2015.   Marland Kitchen History of hernia repair 08/18/2012  . History of porphyria 11/30/2014   Porphyria cutanea tarda in setting of chronic Hepatitis C, frequent  IV iron infusions, and alcohol abuse   . Hypertension   . Peripheral vascular disease ([place])   . Stroke [facility]) 2010   no residual deficits  . Substance use disorder    cocaine  . Tachycardia 07/10/2017  . Trichomonas vaginitis 04/16/2019    SURGICAL HISTORY: Past Surgical History:  Procedure Laterality Date  . ABDOMINAL HYSTERECTOMY    . APPLICATION OF WOUND VAC N/A 06/21/2014   Procedure: APPLICATION OF WOUND VAC;  Surgeon: Coralie Keens, MD;  Location: [facility];  Service: General;  Laterality: N/A;  . CARPAL TUNNEL RELEASE     rt hand  . COLONOSCOPY WITH PROPOFOL N/A 03/17/2018   Procedure: COLONOSCOPY WITH PROPOFOL;  Surgeon: Doran Stabler, MD;  Location: [facility];  Service: Gastroenterology;  Laterality: N/A;  . ENTEROSCOPY N/A 12/04/2012  Procedure: ENTEROSCOPY;  Surgeon: Beryle Beams, MD;  Location: [facility];  Service: Endoscopy;  Laterality: N/A;  . ENTEROSCOPY N/A 12/18/2012   Procedure: ENTEROSCOPY;  Surgeon: Beryle Beams, MD;  Location: [facility];  Service: Endoscopy;  Laterality: N/A;  . ENTEROSCOPY N/A 02/25/2015   Procedure: ENTEROSCOPY;  Surgeon: Inda Castle, MD;  Location: [facility];  Service: Endoscopy;   Laterality: N/A;  . ENTEROSCOPY N/A 10/29/2017   Procedure: ENTEROSCOPY;  Surgeon: Doran Stabler, MD;  [facility];  Service: Gastroenterology;  Laterality: N/A;  . ESOPHAGOGASTRODUODENOSCOPY  05/05/2011   Procedure: ESOPHAGOGASTRODUODENOSCOPY (EGD);  Surgeon: Zenovia Jarred, MD;  Location: [facility];  Service: Gastroenterology;  Laterality: N/A;  Dr. Hilarie Fredrickson will do procedure for Dr. Benson Norway Saturday.  . ESOPHAGOGASTRODUODENOSCOPY  05/08/2011   Procedure: ESOPHAGOGASTRODUODENOSCOPY (EGD);  Surgeon: Beryle Beams;  [facility];  Service: Endoscopy;  Laterality: N/A;  . ESOPHAGOGASTRODUODENOSCOPY  06/07/2011   Procedure: ESOPHAGOGASTRODUODENOSCOPY (EGD);  Surgeon: Beryle Beams, MD;  Location: [facility];  Service: Endoscopy;  Laterality: N/A;  . ESOPHAGOGASTRODUODENOSCOPY  12/20/2011   Procedure: ESOPHAGOGASTRODUODENOSCOPY (EGD);  Surgeon: Beryle Beams, MD;  Location: [facility];  Service: Endoscopy;  Laterality: N/A;  . EYE SURGERY Bilateral    cataracts  . FLEXIBLE SIGMOIDOSCOPY  12/21/2011   Procedure: FLEXIBLE SIGMOIDOSCOPY;  Surgeon: Beryle Beams, MD;  [facility];  Service: Endoscopy;  Laterality: N/A;  . HOT HEMOSTASIS  06/07/2011   Procedure: HOT HEMOSTASIS (ARGON PLASMA COAGULATION/BICAP);  Surgeon: Beryle Beams, MD;  Location[facility];  Service: Endoscopy;  Laterality: N/A;  . INCISIONAL HERNIA REPAIR N/A 06/01/2014   Procedure: ATTEMPTED LAPAROSCOPIC AND OPEN INCISIONAL HERNIA REPAIR WITH MESH;  Surgeon: Coralie Keens, MD;  Location: [facility];  Service: General;  Laterality: N/A;  . INSERTION OF MESH N/A 06/01/2014   Procedure: INSERTION OF MESH;  Surgeon: Coralie Keens, MD;  Location: [facility];  Service: General;  Laterality: N/A;  . LAPAROTOMY  02/16/2012   Procedure: EXPLORATORY LAPAROTOMY;  Surgeon: Edward Jolly, MD;  [facility];  Service: General;  Laterality: N/A;  oversewing of anastomotic leak and rigid sigmoidoscopy  .  LAPAROTOMY N/A 06/21/2014   Procedure: ABDOMINAL WOUND EXPLORATION;  Surgeon: Coralie Keens, MD;  Location: [facility];  Service: General;  Laterality: N/A;  . LEG AMPUTATION ABOVE KNEE     left  . PARTIAL COLECTOMY  02/15/2012   Procedure: PARTIAL COLECTOMY;  Surgeon: Harl Bowie, MD;  Location: [facility];  Service: General;  Laterality: N/A;  . TONSILLECTOMY      I have reviewed the social history and family history with the patient and they are unchanged from previous note.  ALLERGIES:  is allergic to ciprofloxacin; morphine and related; penicillins; codeine; lisinopril; and feraheme [ferumoxytol].  MEDICATIONS:  Current Outpatient Medications  Medication Sig Dispense Refill  . traZODone (DESYREL) 50 MG tablet TAKE 2 TABLETS BY MOUTH AT BEDTIME 30 tablet 5  . ACCU-CHEK FASTCLIX LANCETS MISC Use to test blood glucose 3 times daily. 100 each 11  . ACCU-CHEK GUIDE test strip CHECK BLOOD SUGAR 3 TIMES A DAY 100 each 11  . allopurinol (ZYLOPRIM) 300 MG tablet TAKE 1 TABLET BY MOUTH ONCE DAILY 30 tablet 4  . aspirin EC 81 MG tablet Take 1 tablet (81 mg total) by mouth daily. 30 tablet 1  . Blood Glucose Monitoring Suppl (ACCU-CHEK GUIDE) w/Device KIT 1 each by Does not apply route 3 (three) times daily. The patient is insulin requiring,  ICD 10 code E11.65. The patient tests 3 times a day. 1 kit 1  . cetirizine (ZYRTEC) 10 MG tablet TAKE 1 TABLET BY MOUTH DAILY 90 tablet 0  . cholecalciferol (VITAMIN D) 1000 units tablet Take 1 tablet (1,000 Units total) by mouth daily. 100 tablet 2  . colchicine 0.6 MG tablet TAKE ONE TABLET BY MOUTH DAILY 30 tablet 4  . DILT-XR 180 MG 24 hr capsule TAKE 1 CAPSULE BY MOUTH ONCE DAILY 90 capsule 3  . diphenhydramine-acetaminophen (TYLENOL PM) 25-500 MG TABS tablet Take 1 tablet by mouth at bedtime as needed (sleep).    . Dulaglutide (TRULICITY) 0.62 IR/4.8NI SOPN Inject 0.75 mg into the skin once a week. 6 mL 3  . feeding supplement, GLUCERNA SHAKE, (GLUCERNA  SHAKE) LIQD Take 237 mLs by mouth 3 (three) times daily between meals. (Patient taking differently: Take 237 mLs by mouth 2 (two) times daily between meals. ) 90 Can 0  . FLUoxetine (PROZAC) 20 MG capsule TAKE 3 CAPSULES BY MOUTH DAILY 90 capsule 5  . gabapentin (NEURONTIN) 400 MG capsule TAKE 1 CAPSULE BY MOUTH THREE TIMES A DAY 90 capsule 2  . Insulin Pen Needle (UNIFINE PENTIPS) 32G X 4 MM MISC Use to inject insulin once a day. The patient is insulin requiring, ICD 10 code E11.9. The patient injects 1 times per day. 100 each 2  . losartan (COZAAR) 100 MG tablet TAKE 1 TABLET BY MOUTH EVERY DAY 90 tablet 0  . metFORMIN (GLUCOPHAGE) 1000 MG tablet Take 1 tablet (1,000 mg total) by mouth 2 (two) times daily with a meal. 180 tablet 3  . omeprazole (PRILOSEC) 20 MG capsule Take 1 capsule (20 mg total) by mouth daily. 90 capsule 1  . potassium chloride SA (K-DUR) 20 MEQ tablet Take 1 tablet (20 mEq total) by mouth daily. Take along with lasix 20 tablet 0  . tolnaftate (TINACTIN) 1 % cream Apply 1 application topically 2 (two) times daily. 30 g 0  . vitamin C (ASCORBIC ACID) 500 MG tablet Take 500 mg by mouth every morning.     No current facility-administered medications for this visit.   Facility-Administered Medications Ordered in Other Visits  Medication Dose Route Frequency Provider Last Rate Last Admin  . diphenhydrAMINE (BENADRYL) injection 50 mg  50 mg Intravenous Once Truitt Merle, MD      . famotidine (PEPCID) tablet 20 mg  20 mg Oral Once Truitt Merle, MD      . ferumoxytol [facility]) 510 mg in sodium chloride 0.9 % 100 mL IVPB  510 mg Intravenous Once Truitt Merle, MD        PHYSICAL EXAMINATION: ECOG PERFORMANCE STATUS: 3 - Symptomatic, >50% confined to bed  Vitals:   09/11/19 1440  BP: (!) 149/81  Pulse: 79  Resp: 18  Temp: 98.9 F (37.2 C)  SpO2: 93%   Filed Weights   09/11/19 1440  Weight: 187 lb (84.8 kg)    GENERAL:alert, no distress and comfortable SKIN: skin color,  texture, turgor are normal, no rashes or significant lesions EYES: normal, Conjunctiva are pink and non-injected, sclera clear  NECK: supple, thyroid normal size, non-tender, without nodularity LYMPH:  no palpable lymphadenopathy in the cervical, axillary  LUNGS: clear to auscultation and percussion with normal breathing effort HEART: regular rate & rhythm and no murmurs and no lower extremity edema ABDOMEN:abdomen soft, non-tender and normal bowel sounds Musculoskeletal:no cyanosis of digits and no clubbing  NEURO: alert & oriented x 3 with fluent speech, no focal  motor/sensory deficits  LABORATORY DATA:  I have reviewed the data as listed CBC Latest Ref Rng & Units 09/11/2019 08/13/2019 07/20/2019  WBC 4.0 - 10.5 K/uL 8.5 9.8 8.6  Hemoglobin 12.0 - 15.0 g/dL 11.0(L) 12.2 10.9(L)  Hematocrit 36.0 - 46.0 % 36.0 38.0 34.1(L)  Platelets 150 - 400 K/uL 390 338 402(H)     CMP Latest Ref Rng & Units 07/20/2019 07/15/2019 04/13/2019  Glucose 70 - 99 mg/dL 82 92 88  BUN 8 - 23 mg/dL _0 Creatinine 0.44 - 1.00 mg/dL 1.12(H) 0.97 1.04(H)  Sodium 135 - 145 mmol/L 144 143 141  Potassium 3.5 - 5.1 mmol/L 3.9 4.1 4.2  Chloride 98 - 111 mmol/L 106 103 105  CO2 22 - 32 mmol/L _1 Calcium 8.9 - 10.3 mg/dL 8.6(L) 8.9 8.3(L)  Total Protein 6.5 - 8.1 g/dL 7.2 - -  Total Bilirubin 0.3 - 1.2 mg/dL <0.2(L) - -  Alkaline Phos 38 - 126 U/L 60 - -  AST 15 - 41 U/L 15 - -  ALT 0 - 44 U/L 12 - -      RADIOGRAPHIC STUDIES: I have personally reviewed the radiological images as listed and agreed with the findings in the report. No results found.   ASSESSMENT & PLAN:  DARETH ANDREW is a 69 y.o. female with    1. Iron deficiency anemia secondary to GIbloodloss,andAnemia of Chronic Disease  -History of multiple GI bleeding in 2016, required blood transfusion, hospitalization and IV Feraheme -She previously required blood transfusion periodically, much less lately  -She has been receiving  IV Feraheme every1-73monthon average lately. Shehad a moderate response to IV iron recently.Goal is to keep her Hg >10and ferritin>100 -She has been on oral iron for 5-6 years now and has had black watery stool.  -She had an EGD and colonoscopy on 10/29/2017 and 03/17/2018 with Dr. DLoletha Carrow which was normal.  -[facility]. I discussed if she experience more GI bleeding, [facility] -Her last IV Feraheme was 08/26/19.  -She is clinically feeling more fatigued and tired which she partially attributes to her insomnia. She also had a fall 3 days ago resulting in right knee pain.  -Labs reviewed, Hg 11. She will proceed with IV Feraheme today  -f/u in 4 weeks.    2. Porphyria cutaneoustarda (PCT), resolvedafter Hep C Treatment  3.Chronic hepatitis C infection -She previously tested positive for hepatitis C in 2009. She has previously completed treatment for hepatitis C in 2016 and repeated test was negative.  -She had an EGD and colonoscopy on 10/29/2017 and 03/17/2018 with Dr. DLoletha Carrow which was normal. SDorothyann Gibbscontinue to follow up with him. -05[place]. She is overdue for AFP.  4. DM2 -Management per PCP's office. -On Lantus, Metformin -Was hospitalized in March 2018 for high glucose levels greater than 1000.  -Continue to follow up closely with PCP  5. Left BKA, hasprosthesis, she is wheelchair bound most of time  6. Smoking cessation  -She is still actively smoking,she isnot ready to quit, however, she knows that she needs to quit  7. Right knee pain -likely secondary to osteoarthritis. She will follow up with PCP -She fell on 09/08/19 due to her knee and then resulted with swelling and tenderness of right knee with decreased ROM. I recommend a x-ray of her right knee  today but she declined   8. Insomnia  -She notes she has difficulty  falling asleep and get about 3 hours of sleep at night.  -I recommend she try benadryl or Melatonin at night.   Plan -Lab reviewed, Hg 11. No blood transfusions, Proceed with IV Feraheme today  -Lab and f/u in 4 weeks, IV Feraheme if ferritin <100 (will give two doses if ferritin<30)  No problem-specific Assessment & Plan notes found for this encounter.   No orders of the defined types were placed in this encounter.  All questions were answered. The patient knows to call the clinic with any problems, questions or concerns. No barriers to learning was detected. The total time spent in the appointment was 20 minutes.     Truitt Merle, MD 09/11/2019   I, Joslyn Devon, am acting as scribe for Truitt Merle, MD.   I have reviewed the above documentation for accuracy and completeness, and I agree with the above.

## 2019-09-11 ENCOUNTER — Inpatient Hospital Stay: Payer: Medicare Other

## 2019-09-11 ENCOUNTER — Inpatient Hospital Stay: Payer: Medicare Other | Attending: Nurse Practitioner | Admitting: Hematology

## 2019-09-11 ENCOUNTER — Other Ambulatory Visit: Payer: Self-pay

## 2019-09-11 VITALS — BP 149/81 | HR 79 | Temp 98.9°F | Resp 18 | Ht 61.0 in | Wt 187.0 lb

## 2019-09-11 VITALS — BP 139/72 | HR 83 | Temp 98.1°F | Resp 18

## 2019-09-11 DIAGNOSIS — D500 Iron deficiency anemia secondary to blood loss (chronic): Secondary | ICD-10-CM | POA: Insufficient documentation

## 2019-09-11 DIAGNOSIS — Z86010 Personal history of colonic polyps: Secondary | ICD-10-CM | POA: Insufficient documentation

## 2019-09-11 DIAGNOSIS — Z7982 Long term (current) use of aspirin: Secondary | ICD-10-CM | POA: Insufficient documentation

## 2019-09-11 DIAGNOSIS — E1136 Type 2 diabetes mellitus with diabetic cataract: Secondary | ICD-10-CM | POA: Insufficient documentation

## 2019-09-11 DIAGNOSIS — I10 Essential (primary) hypertension: Secondary | ICD-10-CM | POA: Insufficient documentation

## 2019-09-11 DIAGNOSIS — E1151 Type 2 diabetes mellitus with diabetic peripheral angiopathy without gangrene: Secondary | ICD-10-CM | POA: Diagnosis not present

## 2019-09-11 DIAGNOSIS — K922 Gastrointestinal hemorrhage, unspecified: Secondary | ICD-10-CM

## 2019-09-11 DIAGNOSIS — Z794 Long term (current) use of insulin: Secondary | ICD-10-CM | POA: Diagnosis not present

## 2019-09-11 DIAGNOSIS — Z8673 Personal history of transient ischemic attack (TIA), and cerebral infarction without residual deficits: Secondary | ICD-10-CM | POA: Diagnosis not present

## 2019-09-11 DIAGNOSIS — E119 Type 2 diabetes mellitus without complications: Secondary | ICD-10-CM | POA: Diagnosis not present

## 2019-09-11 DIAGNOSIS — R5383 Other fatigue: Secondary | ICD-10-CM | POA: Diagnosis not present

## 2019-09-11 DIAGNOSIS — B182 Chronic viral hepatitis C: Secondary | ICD-10-CM | POA: Insufficient documentation

## 2019-09-11 DIAGNOSIS — G4700 Insomnia, unspecified: Secondary | ICD-10-CM | POA: Insufficient documentation

## 2019-09-11 DIAGNOSIS — K219 Gastro-esophageal reflux disease without esophagitis: Secondary | ICD-10-CM | POA: Diagnosis not present

## 2019-09-11 DIAGNOSIS — Z8619 Personal history of other infectious and parasitic diseases: Secondary | ICD-10-CM

## 2019-09-11 DIAGNOSIS — F17210 Nicotine dependence, cigarettes, uncomplicated: Secondary | ICD-10-CM | POA: Diagnosis not present

## 2019-09-11 DIAGNOSIS — Z79899 Other long term (current) drug therapy: Secondary | ICD-10-CM | POA: Diagnosis not present

## 2019-09-11 DIAGNOSIS — M7989 Other specified soft tissue disorders: Secondary | ICD-10-CM | POA: Insufficient documentation

## 2019-09-11 DIAGNOSIS — I739 Peripheral vascular disease, unspecified: Secondary | ICD-10-CM | POA: Diagnosis not present

## 2019-09-11 LAB — CBC WITH DIFFERENTIAL (CANCER CENTER ONLY)
Abs Immature Granulocytes: 0.05 10*3/uL (ref 0.00–0.07)
Basophils Absolute: 0 10*3/uL (ref 0.0–0.1)
Basophils Relative: 0 %
Eosinophils Absolute: 0.1 10*3/uL (ref 0.0–0.5)
Eosinophils Relative: 1 %
HCT: 36 % (ref 36.0–46.0)
Hemoglobin: 11 g/dL — ABNORMAL LOW (ref 12.0–15.0)
Immature Granulocytes: 1 %
Lymphocytes Relative: 12 %
Lymphs Abs: 1 10*3/uL (ref 0.7–4.0)
MCH: 28.6 pg (ref 26.0–34.0)
MCHC: 30.6 g/dL (ref 30.0–36.0)
MCV: 93.8 fL (ref 80.0–100.0)
Monocytes Absolute: 1 10*3/uL (ref 0.1–1.0)
Monocytes Relative: 11 %
Neutro Abs: 6.4 10*3/uL (ref 1.7–7.7)
Neutrophils Relative %: 75 %
Platelet Count: 390 10*3/uL (ref 150–400)
RBC: 3.84 MIL/uL — ABNORMAL LOW (ref 3.87–5.11)
RDW: 18.7 % — ABNORMAL HIGH (ref 11.5–15.5)
WBC Count: 8.5 10*3/uL (ref 4.0–10.5)
nRBC: 0 % (ref 0.0–0.2)

## 2019-09-11 MED ORDER — DIPHENHYDRAMINE HCL 50 MG/ML IJ SOLN
50.0000 mg | Freq: Once | INTRAMUSCULAR | Status: AC
  Administered 2019-09-11: 50 mg via INTRAVENOUS

## 2019-09-11 MED ORDER — FAMOTIDINE 20 MG PO TABS
20.0000 mg | ORAL_TABLET | Freq: Once | ORAL | Status: AC
  Administered 2019-09-11: 20 mg via ORAL

## 2019-09-11 MED ORDER — FAMOTIDINE 20 MG PO TABS
ORAL_TABLET | ORAL | Status: AC
  Filled 2019-09-11: qty 1

## 2019-09-11 MED ORDER — SODIUM CHLORIDE 0.9 % IV SOLN
INTRAVENOUS | Status: DC
  Filled 2019-09-11: qty 250

## 2019-09-11 MED ORDER — SODIUM CHLORIDE 0.9 % IV SOLN
510.0000 mg | Freq: Once | INTRAVENOUS | Status: AC
  Administered 2019-09-11: 510 mg via INTRAVENOUS
  Filled 2019-09-11: qty 510

## 2019-09-11 MED ORDER — DIPHENHYDRAMINE HCL 50 MG/ML IJ SOLN
INTRAMUSCULAR | Status: AC
  Filled 2019-09-11: qty 1

## 2019-09-11 NOTE — Patient Instructions (Signed)

## 2019-09-12 ENCOUNTER — Encounter: Payer: Self-pay | Admitting: Hematology

## 2019-09-14 DIAGNOSIS — G933 Postviral fatigue syndrome: Secondary | ICD-10-CM | POA: Diagnosis not present

## 2019-09-14 LAB — IRON AND TIBC
Iron: 41 ug/dL (ref 41–142)
Saturation Ratios: 14 % — ABNORMAL LOW (ref 21–57)
TIBC: 293 ug/dL (ref 236–444)
UIBC: 252 ug/dL (ref 120–384)

## 2019-09-14 LAB — FERRITIN: Ferritin: 96 ng/mL (ref 11–307)

## 2019-09-15 DIAGNOSIS — Z89612 Acquired absence of left leg above knee: Secondary | ICD-10-CM | POA: Diagnosis not present

## 2019-09-15 DIAGNOSIS — E119 Type 2 diabetes mellitus without complications: Secondary | ICD-10-CM | POA: Diagnosis not present

## 2019-10-01 ENCOUNTER — Other Ambulatory Visit: Payer: Self-pay | Admitting: Internal Medicine

## 2019-10-01 DIAGNOSIS — Z794 Long term (current) use of insulin: Secondary | ICD-10-CM

## 2019-10-01 DIAGNOSIS — S78112A Complete traumatic amputation at level between left hip and knee, initial encounter: Secondary | ICD-10-CM

## 2019-10-01 DIAGNOSIS — E118 Type 2 diabetes mellitus with unspecified complications: Secondary | ICD-10-CM

## 2019-10-01 DIAGNOSIS — I10 Essential (primary) hypertension: Secondary | ICD-10-CM

## 2019-10-05 ENCOUNTER — Other Ambulatory Visit: Payer: Self-pay

## 2019-10-05 NOTE — Telephone Encounter (Signed)
Requesting to speak with a nurse about meds, please call pt back.  

## 2019-10-05 NOTE — Telephone Encounter (Signed)
Returned pt's call - stated her meds were not refilled; unsure which ones but needs Gabapentin which she is out. Informed pt her doctor had refilled her meds on 5/6.  I called [facility] - talked to [place]; stated they did not received any rx on 5/6. Verbal orders given for Gabapentin, Cetirizine, Metformin, and Dilt-XR and will get them ready to send out.  Called pt back - informed of above.

## 2019-10-08 NOTE — Progress Notes (Signed)
[place]   [phone] [phone]) [phone]   Clinic Follow up Note   Patient Care Team: Jose Persia, MD as PCP - Evalina Field, MD as Consulting Physician (Ophthalmology) Coralie Keens, MD as Consulting Physician (General Surgery) Truitt Merle, MD as Consulting Physician (Hematology) Comer, Okey Regal, MD as Consulting Physician (Infectious Diseases)  Date of Service:  10/12/2019  CHIEF COMPLAINT: F/u of Anemia  CURRENT THERAPY:  IV Feraheme as neededif ferritin <100  INTERVAL HISTORY:  [name] is here for a follow up of anemia. She presents to the clinic alone. She notes she is doing better after recent IV iron. She notes she also has swelling of her right LE mostly with her feet down. She notes with elevation this improves or resolves. She notes only one episode of GI bleeding with black stool, has not recurrence. She notes she does not remember who treated her Hep C. She notes she is considering Lap Band to help her lose weight. She notes her PCP put her on Glucerna BID when she was not eating much but she continued.     REVIEW OF SYSTEMS:   Constitutional: Denies fevers, chills or abnormal weight loss Eyes: Denies blurriness of vision Ears, nose, mouth, throat, and face: Denies mucositis or sore throat Respiratory: Denies cough, dyspnea or wheezes Cardiovascular: Denies palpitation, chest discomfort (+) right lower extremity swelling Gastrointestinal:  Denies nausea, heartburn or change in bowel habits Skin: Denies abnormal skin rashes Lymphatics: Denies new lymphadenopathy or easy bruising Neurological:Denies numbness, tingling or new weaknesses Behavioral/Psych: Mood is stable, no new changes  All other systems were reviewed with the patient and are negative.  MEDICAL HISTORY:  Past Medical History:  Diagnosis Date  . Allergic rhinitis   . Anemia, iron deficiency 05/03/2011   2/2 GI AVMs - recieves iron infusions as well  as periodic red blood cell transfusions  . Anxiety   . Arteriovenous malformation of duodenum 02/25/2015  . CARPAL TUNNEL RELEASE, HX OF 07/22/2008  . Colon polyps   . Diabetes mellitus without complication ([place])    type 2  . Difficulty sleeping    takes trazadone for sleep  . Diverticulosis   . GERD (gastroesophageal reflux disease)   . Hepatitis C    in SVR with Harvoni 12/2014-02/2015.   Marland Kitchen History of hernia repair 08/18/2012  . History of porphyria 11/30/2014   Porphyria cutanea tarda in setting of chronic Hepatitis C, frequent  IV iron infusions, and alcohol abuse   . Hypertension   . Peripheral vascular disease ([place])   . Stroke [facility]) 2010   no residual deficits  . Substance use disorder    cocaine  . Tachycardia 07/10/2017  . Trichomonas vaginitis 04/16/2019    SURGICAL HISTORY: Past Surgical History:  Procedure Laterality Date  . ABDOMINAL HYSTERECTOMY    . APPLICATION OF WOUND VAC N/A 06/21/2014   Procedure: APPLICATION OF WOUND VAC;  Surgeon: Coralie Keens, MD;  Location: [place];  Service: General;  Laterality: N/A;  . CARPAL TUNNEL RELEASE     rt hand  . COLONOSCOPY WITH PROPOFOL N/A 03/17/2018   Procedure: COLONOSCOPY WITH PROPOFOL;  Surgeon: Doran Stabler, MD;  [facility]: [facility];  Service: Gastroenterology;  Laterality: N/A;  . ENTEROSCOPY N/A 12/04/2012   Procedure: ENTEROSCOPY;  Surgeon: Beryle Beams, MD;  [facility]: [facility];  Service: Endoscopy;  Laterality: N/A;  . ENTEROSCOPY N/A 12/18/2012   Procedure: ENTEROSCOPY;  Surgeon: Beryle Beams, MD;  [facility];  Service: Endoscopy;  Laterality: N/A;  . ENTEROSCOPY N/A 02/25/2015   Procedure: ENTEROSCOPY;  Surgeon: Inda Castle, MD;  Location: [facility];  Service: Endoscopy;  Laterality: N/A;  . ENTEROSCOPY N/A 10/29/2017   Procedure: ENTEROSCOPY;  Surgeon: Doran Stabler, MD;  [facility];  Service: Gastroenterology;  Laterality: N/A;  . ESOPHAGOGASTRODUODENOSCOPY   05/05/2011   Procedure: ESOPHAGOGASTRODUODENOSCOPY (EGD);  Surgeon: Zenovia Jarred, MD;  Location[facility];  Service: Gastroenterology;  Laterality: N/A;  Dr. Hilarie Fredrickson will do procedure for Dr. Benson Norway Saturday.  . ESOPHAGOGASTRODUODENOSCOPY  05/08/2011   Procedure: ESOPHAGOGASTRODUODENOSCOPY (EGD);  Surgeon: Beryle Beams;  [facility];  Service: Endoscopy;  Laterality: N/A;  . ESOPHAGOGASTRODUODENOSCOPY  06/07/2011   Procedure: ESOPHAGOGASTRODUODENOSCOPY (EGD);  Surgeon: Beryle Beams, MD;  [facility];  Service: Endoscopy;  Laterality: N/A;  . ESOPHAGOGASTRODUODENOSCOPY  12/20/2011   Procedure: ESOPHAGOGASTRODUODENOSCOPY (EGD);  Surgeon: Beryle Beams, MD;  Location[facility];  Service: Endoscopy;  Laterality: N/A;  . EYE SURGERY Bilateral    cataracts  . FLEXIBLE SIGMOIDOSCOPY  12/21/2011   Procedure: FLEXIBLE SIGMOIDOSCOPY;  Surgeon: Beryle Beams, MD;  [facility];  Service: Endoscopy;  Laterality: N/A;  . HOT HEMOSTASIS  06/07/2011   Procedure: HOT HEMOSTASIS (ARGON PLASMA COAGULATION/BICAP);  Surgeon: Beryle Beams, MD;  Location[facility];  Service: Endoscopy;  Laterality: N/A;  . INCISIONAL HERNIA REPAIR N/A 06/01/2014   Procedure: ATTEMPTED LAPAROSCOPIC AND OPEN INCISIONAL HERNIA REPAIR WITH MESH;  Surgeon: Coralie Keens, MD;  Location: [place];  Service: General;  Laterality: N/A;  . INSERTION OF MESH N/A 06/01/2014   Procedure: INSERTION OF MESH;  Surgeon: Coralie Keens, MD;  Location: [place];  Service: General;  Laterality: N/A;  . LAPAROTOMY  02/16/2012   Procedure: EXPLORATORY LAPAROTOMY;  Surgeon: Edward Jolly, MD;  [facility];  Service: General;  Laterality: N/A;  oversewing of anastomotic leak and rigid sigmoidoscopy  . LAPAROTOMY N/A 06/21/2014   Procedure: ABDOMINAL WOUND EXPLORATION;  Surgeon: Coralie Keens, MD;  Location: [place];  Service: General;  Laterality: N/A;  . LEG AMPUTATION ABOVE KNEE     left  . PARTIAL  COLECTOMY  02/15/2012   Procedure: PARTIAL COLECTOMY;  Surgeon: Harl Bowie, MD;  [facility];  Service: General;  Laterality: N/A;  . TONSILLECTOMY      I have reviewed the social history and family history with the patient and they are unchanged from previous note.  ALLERGIES:  is allergic to ciprofloxacin; morphine and related; penicillins; codeine; lisinopril; and feraheme [ferumoxytol].  MEDICATIONS:  Current Outpatient Medications  Medication Sig Dispense Refill  . cetirizine (ZYRTEC) 10 MG tablet TAKE 1 TABLET BY MOUTH DAILY 90 tablet 1  . DILT-XR 180 MG 24 hr capsule TAKE 1 CAPSULE BY MOUTH ONCE DAILY 90 capsule 1  . gabapentin (NEURONTIN) 400 MG capsule TAKE 1 CAPSULE BY MOUTH THREE TIMES A DAY 90 capsule 5  . metFORMIN (GLUCOPHAGE) 1000 MG tablet TAKE 1 TABLET BY MOUTH TWICE A DAY WITH A MEAL 180 tablet 1  . traZODone (DESYREL) 50 MG tablet TAKE 2 TABLETS BY MOUTH AT BEDTIME 30 tablet 5  . ACCU-CHEK FASTCLIX LANCETS MISC Use to test blood glucose 3 times daily. 100 each 11  . ACCU-CHEK GUIDE test strip CHECK BLOOD SUGAR 3 TIMES A DAY 100 each 11  . allopurinol (ZYLOPRIM) 300 MG tablet TAKE 1 TABLET BY MOUTH ONCE DAILY 30 tablet 4  . aspirin EC 81 MG tablet  Take 1 tablet (81 mg total) by mouth daily. 30 tablet 1  . Blood Glucose Monitoring Suppl (ACCU-CHEK GUIDE) w/Device KIT 1 each by Does not apply route 3 (three) times daily. The patient is insulin requiring, ICD 10 code E11.65. The patient tests 3 times a day. 1 kit 1  . cholecalciferol (VITAMIN D) 1000 units tablet Take 1 tablet (1,000 Units total) by mouth daily. 100 tablet 2  . colchicine 0.6 MG tablet TAKE ONE TABLET BY MOUTH DAILY 30 tablet 4  . diphenhydramine-acetaminophen (TYLENOL PM) 25-500 MG TABS tablet Take 1 tablet by mouth at bedtime as needed (sleep).    . Dulaglutide (TRULICITY) 7.54 GB/2.0FE SOPN Inject 0.75 mg into the skin once a week. 6 mL 3  . feeding supplement, GLUCERNA SHAKE, (GLUCERNA SHAKE)  LIQD Take 237 mLs by mouth 3 (three) times daily between meals. (Patient taking differently: Take 237 mLs by mouth 2 (two) times daily between meals. ) 90 Can 0  . FLUoxetine (PROZAC) 20 MG capsule TAKE 3 CAPSULES BY MOUTH DAILY 90 capsule 5  . Insulin Pen Needle (UNIFINE PENTIPS) 32G X 4 MM MISC Use to inject insulin once a day. The patient is insulin requiring, ICD 10 code E11.9. The patient injects 1 times per day. 100 each 2  . losartan (COZAAR) 100 MG tablet TAKE 1 TABLET BY MOUTH EVERY DAY 90 tablet 0  . omeprazole (PRILOSEC) 20 MG capsule Take 1 capsule (20 mg total) by mouth daily. 90 capsule 1  . potassium chloride SA (K-DUR) 20 MEQ tablet Take 1 tablet (20 mEq total) by mouth daily. Take along with lasix (Patient taking differently: Take 20 mEq by mouth daily. ) 20 tablet 0  . tolnaftate (TINACTIN) 1 % cream Apply 1 application topically 2 (two) times daily. 30 g 0  . vitamin C (ASCORBIC ACID) 500 MG tablet Take 500 mg by mouth every morning.     No current facility-administered medications for this visit.    PHYSICAL EXAMINATION: ECOG PERFORMANCE STATUS: 2 - Symptomatic, <50% confined to bed  Vitals:   10/12/19 0907  BP: (!) 167/115  Pulse: 95  Resp: 18  Temp: 98.2 F (36.8 C)  SpO2: 96%   Filed Weights   10/12/19 0907  Weight: 181 lb 8 oz (82.3 kg)    GENERAL:alert, no distress and comfortable SKIN: skin color, texture, turgor are normal, no rashes or significant lesions EYES: normal, Conjunctiva are pink and non-injected, sclera clear  NECK: supple, thyroid normal size, non-tender, without nodularity LYMPH:  no palpable lymphadenopathy in the cervical, axillary  LUNGS: clear to auscultation and percussion with normal breathing effort HEART: regular rate & rhythm and no murmurs (+) right lower extremity edema  ABDOMEN:abdomen soft, non-tender and normal bowel sounds  Musculoskeletal:no cyanosis of digits and no clubbing  NEURO: alert & oriented x 3 with fluent  speech, no focal motor/sensory deficits  LABORATORY DATA:  I have reviewed the data as listed CBC Latest Ref Rng & Units 10/12/2019 09/11/2019 08/13/2019  WBC 4.0 - 10.5 K/uL 8.3 8.5 9.8  Hemoglobin 12.0 - 15.0 g/dL 13.0 11.0(L) 12.2  Hematocrit 36.0 - 46.0 % 40.5 36.0 38.0  Platelets 150 - 400 K/uL 338 390 338     CMP Latest Ref Rng & Units 07/20/2019 07/15/2019 04/13/2019  Glucose 70 - 99 mg/dL 82 92 88  BUN 8 - 23 mg/dL '16 13 16  '$ Creatinine 0.44 - 1.00 mg/dL 1.12(H) 0.97 1.04(H)  Sodium 135 - 145 mmol/L 144 143 141  Potassium 3.5 - 5.1 mmol/L 3.9 4.1 4.2  Chloride 98 - 111 mmol/L 106 103 105  CO2 22 - 32 mmol/L '26 26 24  '$ Calcium 8.9 - 10.3 mg/dL 8.6(L) 8.9 8.3(L)  Total Protein 6.5 - 8.1 g/dL 7.2 - -  Total Bilirubin 0.3 - 1.2 mg/dL <0.2(L) - -  Alkaline Phos 38 - 126 U/L 60 - -  AST 15 - 41 U/L 15 - -  ALT 0 - 44 U/L 12 - -      RADIOGRAPHIC STUDIES: I have personally reviewed the radiological images as listed and agreed with the findings in the report. VAS [place] LOWER EXTREMITY VENOUS (DVT)  Result Date: 10/12/2019  Lower Venous DVTStudy Indications: Edema.  Comparison Study: 08/19/16 previous Performing Technologist: Abram Sander RVS  Examination Guidelines: A complete evaluation includes B-mode imaging, spectral Doppler, color Doppler, and power Doppler as needed of all accessible portions of each vessel. Bilateral testing is considered an integral part of a complete examination. Limited examinations for reoccurring indications may be performed as noted. The reflux portion of the exam is performed with the patient in reverse Trendelenburg.  +---------+---------------+---------+-----------+----------+--------------+ RIGHT    CompressibilityPhasicitySpontaneityPropertiesThrombus Aging +---------+---------------+---------+-----------+----------+--------------+ CFV      Full           Yes      Yes                                  +---------+---------------+---------+-----------+----------+--------------+ SFJ      Full                                                        +---------+---------------+---------+-----------+----------+--------------+ FV Prox  Full                                                        +---------+---------------+---------+-----------+----------+--------------+ FV Mid   Full                                                        +---------+---------------+---------+-----------+----------+--------------+ FV DistalFull                                                        +---------+---------------+---------+-----------+----------+--------------+ PFV      Full                                                        +---------+---------------+---------+-----------+----------+--------------+ POP      Full           Yes      Yes                                 +---------+---------------+---------+-----------+----------+--------------+  PTV      Full                                                        +---------+---------------+---------+-----------+----------+--------------+ PERO                                                  Not visualized +---------+---------------+---------+-----------+----------+--------------+   +----+---------------+---------+-----------+----------+--------------+ LEFTCompressibilityPhasicitySpontaneityPropertiesThrombus Aging +----+---------------+---------+-----------+----------+--------------+ CFV                                              aka            +----+---------------+---------+-----------+----------+--------------+     *See table(s) above for measurements and observations.    Preliminary      ASSESSMENT & PLAN:  [name] is a 69 y.o. female with    1. Iron deficiency anemia secondary to GIbloodloss,andAnemia of Chronic Disease  -History of multiple GI bleeding in 2016, required blood  transfusion, hospitalization and IV Feraheme -She previously required blood transfusion periodically, much less lately  -She has been receiving IV Feraheme every1-57monthon average lately. Shehad a moderate response to IV iron recently.Goal is to keep her Hg >10and ferritin>100 -She has been on oral iron for 5-6 years now and has had black watery stool.  -She had an EGD and colonoscopy on 10/29/2017 and 03/17/2018 with Dr. DLoletha Carrow which was normal.  -[facility]. I discussed if she experience more GI bleeding, [facility] -Her last IV Feraheme was 09/11/19.  -She is clinically doing well and her energy improved with IV iron recently. Labs reviewed, CBC WNL, iron panel still pending.  -She only notes one episode of black stool lately, otherwise no obvious bleeding.  -Pt would like to loose weight and asked about bariatric surgery. I discussed Lap band surgery is not recommended given this can lead to further anemia with other nutritional deficiency. I recommend controlling her diet and exercise to help her lose weight. Given she can eat well she no longer needs to take Glucerna.  -F/u in 4 weeks    2. Porphyria cutaneoustarda (PCT), resolvedafter Hep C Treatment  3.Chronic hepatitis C infection -She previously tested positive for hepatitis C in 2009. She has previously completed treatment for hepatitis C in 2016 and repeated test was negative.  -She had an EGD and colonoscopy on 10/29/2017 and 03/17/2018 with Dr. DLoletha Carrow which was normal. SDorothyann Gibbscontinue to follow up with him. -05/[place]. AFP is still pending for today (10/12/19)  4. DM2 -Management per PCP's office. -On Lantus, Metformin -Was hospitalized in March 2018 for high glucose levels greater than 1000.  -Continue to follow up closely with PCP  5. Left BKA, hasprosthesis, she is  wheelchair bound most of time, Right LE edema  -She recently has been having right LE edema when her foot is down. This is intermittent. -I discussed the possibly etiology. I have low suspicion this is related  to blood clots or heart issues. This could be related to nutritional deficiencies or decreased venous return.  -I discussed elevating her right leg. I will obtain doppler this week given she is not very mobile, she is agreeable   6. Smoking cessation  -She is still actively smoking,she isnot ready to quit, however, she knows that she needs to quit  7. Right knee pain -likely secondary to osteoarthritis. She will follow up with PCP -She fell on 09/08/19 due to her knee and then resulted with swelling and tenderness of right knee with decreased ROM. I previously recommend a x-ray of her right knee today but she declined   8. Insomnia  -She notes she has difficulty falling asleep and get about 3 hours of sleep at night.  -I recommend she try benadryl or Melatonin at night.   Plan -Right LE doppler today or tomorrow to rule out DVT -ferritin 45 today, will set up iv feraheme this or next week  -Monthly lab X4  -F/u in 4 months, IV Ferahemeif ferritin <100 (will give two doses if ferritin<30)   No problem-specific Assessment & Plan notes found for this encounter.   No orders of the defined types were placed in this encounter.  All questions were answered. The patient knows to call the clinic with any problems, questions or concerns. No barriers to learning was detected. The total time spent in the appointment was 25 minutes.     Truitt Merle, MD 10/12/2019   I, Joslyn Devon, am acting as scribe for Truitt Merle, MD.   I have reviewed the above documentation for accuracy and completeness, and I agree with the above.

## 2019-10-12 ENCOUNTER — Other Ambulatory Visit: Payer: Self-pay

## 2019-10-12 ENCOUNTER — Telehealth: Payer: Self-pay | Admitting: Hematology

## 2019-10-12 ENCOUNTER — Inpatient Hospital Stay: Payer: Medicare Other

## 2019-10-12 ENCOUNTER — Encounter: Payer: Self-pay | Admitting: Hematology

## 2019-10-12 ENCOUNTER — Ambulatory Visit (HOSPITAL_COMMUNITY)
Admission: RE | Admit: 2019-10-12 | Discharge: 2019-10-12 | Disposition: A | Discharge status: Home / Self Care | Payer: Medicare Other | Source: Ambulatory Visit | Attending: Hematology | Admitting: Hematology

## 2019-10-12 ENCOUNTER — Inpatient Hospital Stay: Payer: Medicare Other | Attending: Nurse Practitioner | Admitting: Hematology

## 2019-10-12 VITALS — BP 167/115 | HR 95 | Temp 98.2°F | Resp 18 | Ht 61.0 in | Wt 181.5 lb

## 2019-10-12 DIAGNOSIS — K219 Gastro-esophageal reflux disease without esophagitis: Secondary | ICD-10-CM | POA: Diagnosis not present

## 2019-10-12 DIAGNOSIS — E119 Type 2 diabetes mellitus without complications: Secondary | ICD-10-CM | POA: Diagnosis not present

## 2019-10-12 DIAGNOSIS — Z794 Long term (current) use of insulin: Secondary | ICD-10-CM | POA: Insufficient documentation

## 2019-10-12 DIAGNOSIS — E1151 Type 2 diabetes mellitus with diabetic peripheral angiopathy without gangrene: Secondary | ICD-10-CM | POA: Insufficient documentation

## 2019-10-12 DIAGNOSIS — Z8673 Personal history of transient ischemic attack (TIA), and cerebral infarction without residual deficits: Secondary | ICD-10-CM | POA: Insufficient documentation

## 2019-10-12 DIAGNOSIS — Z86010 Personal history of colonic polyps: Secondary | ICD-10-CM | POA: Insufficient documentation

## 2019-10-12 DIAGNOSIS — R600 Localized edema: Secondary | ICD-10-CM

## 2019-10-12 DIAGNOSIS — I739 Peripheral vascular disease, unspecified: Secondary | ICD-10-CM | POA: Insufficient documentation

## 2019-10-12 DIAGNOSIS — M7989 Other specified soft tissue disorders: Secondary | ICD-10-CM | POA: Diagnosis not present

## 2019-10-12 DIAGNOSIS — D500 Iron deficiency anemia secondary to blood loss (chronic): Secondary | ICD-10-CM | POA: Diagnosis not present

## 2019-10-12 DIAGNOSIS — F17210 Nicotine dependence, cigarettes, uncomplicated: Secondary | ICD-10-CM | POA: Insufficient documentation

## 2019-10-12 DIAGNOSIS — Z79899 Other long term (current) drug therapy: Secondary | ICD-10-CM | POA: Insufficient documentation

## 2019-10-12 DIAGNOSIS — B182 Chronic viral hepatitis C: Secondary | ICD-10-CM | POA: Insufficient documentation

## 2019-10-12 DIAGNOSIS — Z993 Dependence on wheelchair: Secondary | ICD-10-CM | POA: Diagnosis not present

## 2019-10-12 DIAGNOSIS — G933 Postviral fatigue syndrome: Secondary | ICD-10-CM | POA: Diagnosis not present

## 2019-10-12 DIAGNOSIS — G4700 Insomnia, unspecified: Secondary | ICD-10-CM | POA: Insufficient documentation

## 2019-10-12 DIAGNOSIS — D638 Anemia in other chronic diseases classified elsewhere: Secondary | ICD-10-CM | POA: Diagnosis not present

## 2019-10-12 DIAGNOSIS — F419 Anxiety disorder, unspecified: Secondary | ICD-10-CM | POA: Diagnosis not present

## 2019-10-12 DIAGNOSIS — Z8619 Personal history of other infectious and parasitic diseases: Secondary | ICD-10-CM

## 2019-10-12 DIAGNOSIS — E1136 Type 2 diabetes mellitus with diabetic cataract: Secondary | ICD-10-CM | POA: Diagnosis not present

## 2019-10-12 DIAGNOSIS — Z89512 Acquired absence of left leg below knee: Secondary | ICD-10-CM | POA: Insufficient documentation

## 2019-10-12 DIAGNOSIS — I10 Essential (primary) hypertension: Secondary | ICD-10-CM | POA: Insufficient documentation

## 2019-10-12 LAB — FERRITIN: Ferritin: 45 ng/mL (ref 11–307)

## 2019-10-12 LAB — CBC WITH DIFFERENTIAL (CANCER CENTER ONLY)
Abs Immature Granulocytes: 0.04 10*3/uL (ref 0.00–0.07)
Basophils Absolute: 0 10*3/uL (ref 0.0–0.1)
Basophils Relative: 1 %
Eosinophils Absolute: 0.1 10*3/uL (ref 0.0–0.5)
Eosinophils Relative: 1 %
HCT: 40.5 % (ref 36.0–46.0)
Hemoglobin: 13 g/dL (ref 12.0–15.0)
Immature Granulocytes: 1 %
Lymphocytes Relative: 13 %
Lymphs Abs: 1.1 10*3/uL (ref 0.7–4.0)
MCH: 29.3 pg (ref 26.0–34.0)
MCHC: 32.1 g/dL (ref 30.0–36.0)
MCV: 91.4 fL (ref 80.0–100.0)
Monocytes Absolute: 0.9 10*3/uL (ref 0.1–1.0)
Monocytes Relative: 11 %
Neutro Abs: 6.2 10*3/uL (ref 1.7–7.7)
Neutrophils Relative %: 73 %
Platelet Count: 338 10*3/uL (ref 150–400)
RBC: 4.43 MIL/uL (ref 3.87–5.11)
RDW: 17.1 % — ABNORMAL HIGH (ref 11.5–15.5)
WBC Count: 8.3 10*3/uL (ref 4.0–10.5)
nRBC: 0 % (ref 0.0–0.2)

## 2019-10-12 LAB — IRON AND TIBC
Iron: 53 ug/dL (ref 41–142)
Saturation Ratios: 19 % — ABNORMAL LOW (ref 21–57)
TIBC: 270 ug/dL (ref 236–444)
UIBC: 217 ug/dL (ref 120–384)

## 2019-10-12 NOTE — Progress Notes (Signed)
Lower extremity venous has been completed.   Preliminary results in CV Proc.   Abram Sander 10/12/2019 10:33 AM

## 2019-10-12 NOTE — Telephone Encounter (Signed)
Scheduled appt per 5/17 sch message- pt aware of appt date and time   

## 2019-10-13 ENCOUNTER — Telehealth: Payer: Self-pay | Admitting: Emergency Medicine

## 2019-10-13 ENCOUNTER — Telehealth: Payer: Self-pay | Admitting: Hematology

## 2019-10-13 LAB — AFP TUMOR MARKER: AFP, Serum, Tumor Marker: 3.1 ng/mL (ref 0.0–8.3)

## 2019-10-13 MED FILL — TRULICITY 0.75 MG/0.5 ML PE: 0.75 | 84 days supply | Qty: 6 | Fill #0

## 2019-10-13 NOTE — Telephone Encounter (Signed)
Scheduled appt per 5/18 sch message - pt aware of appts added , will get an updated scheduled.

## 2019-10-13 NOTE — Telephone Encounter (Addendum)
PT verbalized understanding   ----- Message from Truitt Merle, MD sent at 10/13/2019 11:44 AM EDT ----- Please make sure pt knows her [place] result, and appointment next week for iv iron, thanks  Truitt Merle

## 2019-10-14 ENCOUNTER — Other Ambulatory Visit: Payer: Self-pay

## 2019-10-14 ENCOUNTER — Ambulatory Visit (INDEPENDENT_AMBULATORY_CARE_PROVIDER_SITE_OTHER): Payer: Medicare Other | Admitting: Internal Medicine

## 2019-10-14 ENCOUNTER — Encounter: Payer: Self-pay | Admitting: Internal Medicine

## 2019-10-14 VITALS — BP 141/82 | HR 94 | Temp 99.4°F | Ht 61.0 in | Wt 180.7 lb

## 2019-10-14 DIAGNOSIS — I10 Essential (primary) hypertension: Secondary | ICD-10-CM | POA: Diagnosis not present

## 2019-10-14 DIAGNOSIS — E1140 Type 2 diabetes mellitus with diabetic neuropathy, unspecified: Secondary | ICD-10-CM | POA: Diagnosis not present

## 2019-10-14 DIAGNOSIS — E118 Type 2 diabetes mellitus with unspecified complications: Secondary | ICD-10-CM

## 2019-10-14 DIAGNOSIS — F1990 Other psychoactive substance use, unspecified, uncomplicated: Secondary | ICD-10-CM

## 2019-10-14 DIAGNOSIS — E119 Type 2 diabetes mellitus without complications: Secondary | ICD-10-CM

## 2019-10-14 DIAGNOSIS — S78112A Complete traumatic amputation at level between left hip and knee, initial encounter: Secondary | ICD-10-CM

## 2019-10-14 DIAGNOSIS — Z8619 Personal history of other infectious and parasitic diseases: Secondary | ICD-10-CM | POA: Diagnosis not present

## 2019-10-14 DIAGNOSIS — Z794 Long term (current) use of insulin: Secondary | ICD-10-CM

## 2019-10-14 DIAGNOSIS — M109 Gout, unspecified: Secondary | ICD-10-CM

## 2019-10-14 LAB — POCT GLYCOSYLATED HEMOGLOBIN (HGB A1C): Hemoglobin A1C: 5.4 % (ref 4.0–5.6)

## 2019-10-14 LAB — GLUCOSE, CAPILLARY: Glucose-Capillary: 117 mg/dL — ABNORMAL HIGH (ref 70–99)

## 2019-10-14 MED ORDER — ALLOPURINOL 100 MG PO TABS: 100.0000 mg | ORAL_TABLET | Freq: Every day | ORAL | 1 refills | Status: DC

## 2019-10-14 MED ORDER — CHLORTHALIDONE 25 MG PO TABS: 12.5000 mg | ORAL_TABLET | Freq: Every day | ORAL | 1 refills | Status: DC

## 2019-10-14 MED ORDER — LOSARTAN POTASSIUM 100 MG PO TABS: 100.0000 mg | ORAL_TABLET | Freq: Every day | ORAL | 3 refills | Status: DC

## 2019-10-14 MED ORDER — COLCHICINE 0.6 MG PO TABS: 0.6000 mg | ORAL_TABLET | Freq: Every day | ORAL | 0 refills | Status: DC

## 2019-10-14 MED ORDER — GABAPENTIN 400 MG PO CAPS: ORAL_CAPSULE | ORAL | 5 refills | Status: DC

## 2019-10-14 MED ORDER — METFORMIN HCL 1000 MG PO TABS: ORAL_TABLET | ORAL | 1 refills | Status: DC

## 2019-10-14 MED ORDER — TRULICITY 0.75 MG/0.5ML ~~LOC~~ SOAJ: 0.7500 mg | SUBCUTANEOUS | 6 refills | Status: DC

## 2019-10-14 MED FILL — CHLORTHALIDONE 25 MG TABS: 25 | 90 days supply | Qty: 45 | Fill #0

## 2019-10-14 NOTE — Progress Notes (Signed)
   CC: HTN  HPI:  [name] is a 69 y.o. with a PMHx listed below who presents to the clinic for HTN follow up.   Please see the Encounters tab for problem-based Assessment & Plan regarding status of patient's chronic conditions.  Past Medical History:  Diagnosis Date  . Allergic rhinitis   . Anemia, iron deficiency 05/03/2011   2/2 GI AVMs - recieves iron infusions as well as periodic red blood cell transfusions  . Anxiety   . Arteriovenous malformation of duodenum 02/25/2015  . CARPAL TUNNEL RELEASE, HX OF 07/22/2008  . Colon polyps   . Diabetes mellitus without complication ([place])    type 2  . Difficulty sleeping    takes trazadone for sleep  . Diverticulosis   . GERD (gastroesophageal reflux disease)   . Hepatitis C    in SVR with Harvoni 12/2014-02/2015.   Marland Kitchen History of hernia repair 08/18/2012  . History of porphyria 11/30/2014   Porphyria cutanea tarda in setting of chronic Hepatitis C, frequent  IV iron infusions, and alcohol abuse   . Hypertension   . Peripheral vascular disease ([place])   . Stroke [facility]) 2010   no residual deficits  . Substance use disorder    cocaine  . Tachycardia 07/10/2017  . Trichomonas vaginitis 04/16/2019   Review of Systems: Review of Systems  Constitutional: Negative for chills and fever.  Respiratory: Negative for cough and shortness of breath.   Cardiovascular: Positive for leg swelling. Negative for chest pain.  Gastrointestinal: Negative for abdominal pain, diarrhea, nausea and vomiting.  Musculoskeletal: Positive for joint pain (hip pain with exertion). Negative for myalgias.  Neurological: Negative for dizziness, focal weakness, weakness and headaches.  Psychiatric/Behavioral: Positive for substance abuse.   Physical Exam:  Vitals:   10/14/19 1411  BP: (!) 141/82  Pulse: 94  Temp: 99.4 F (37.4 C)  TempSrc: Oral  SpO2: 99%  Weight: 180 lb 11.2 oz (82 kg)  Height: 5\' 1"  (1.549 m)   Physical Exam Vitals and nursing note  reviewed.  Constitutional:      General: She is not in acute distress.    Appearance: She is normal weight.  HENT:     Head: Normocephalic and atraumatic.  Pulmonary:     Effort: Pulmonary effort is normal. No respiratory distress.  Musculoskeletal:     Right lower leg: Edema (1+ pitting) present.  Skin:    General: Skin is warm and dry.  Neurological:     Mental Status: She is alert and oriented to person, place, and time. Mental status is at baseline.  Psychiatric:        Mood and Affect: Mood normal.        Behavior: Behavior normal.    Assessment & Plan:   See Encounters Tab for problem based charting.  Patient discussed with Dr. Evette Doffing

## 2019-10-14 NOTE — Patient Instructions (Addendum)
It was nice seeing you today! Thank you for choosing [facility] for your Primary Care.    Today we talked about:   Diabetes: Keep up the great work! Your sugars are well within goal. No changes planned today  High blood pressure: We are switching the Diltiazem for a medication called Chlorthalidone. This medication will also help with the fluid on your legs. This medication can increase the chance of gout, so please start taking the Allopurinol and Colchicine daily as well. In a few months, we will stop the Colchicine and continue with just Allopurinol.   Leg Pain: We increased your Gabapentin dose to 2 tablets at night instead of 1.

## 2019-10-14 NOTE — Progress Notes (Signed)
DME order for Prosthetic Liners faxed to [employer] [phone]).

## 2019-10-19 ENCOUNTER — Other Ambulatory Visit: Payer: Self-pay | Admitting: Internal Medicine

## 2019-10-19 ENCOUNTER — Other Ambulatory Visit: Payer: Self-pay

## 2019-10-19 ENCOUNTER — Inpatient Hospital Stay: Payer: Medicare Other

## 2019-10-19 VITALS — BP 143/89 | HR 87 | Temp 98.5°F | Resp 17

## 2019-10-19 DIAGNOSIS — D638 Anemia in other chronic diseases classified elsewhere: Secondary | ICD-10-CM | POA: Diagnosis not present

## 2019-10-19 DIAGNOSIS — D500 Iron deficiency anemia secondary to blood loss (chronic): Secondary | ICD-10-CM

## 2019-10-19 DIAGNOSIS — Z89512 Acquired absence of left leg below knee: Secondary | ICD-10-CM | POA: Diagnosis not present

## 2019-10-19 DIAGNOSIS — G4700 Insomnia, unspecified: Secondary | ICD-10-CM | POA: Diagnosis not present

## 2019-10-19 DIAGNOSIS — R600 Localized edema: Secondary | ICD-10-CM | POA: Diagnosis not present

## 2019-10-19 DIAGNOSIS — M109 Gout, unspecified: Secondary | ICD-10-CM

## 2019-10-19 DIAGNOSIS — Z993 Dependence on wheelchair: Secondary | ICD-10-CM | POA: Diagnosis not present

## 2019-10-19 DIAGNOSIS — E1151 Type 2 diabetes mellitus with diabetic peripheral angiopathy without gangrene: Secondary | ICD-10-CM | POA: Diagnosis not present

## 2019-10-19 DIAGNOSIS — Z86010 Personal history of colonic polyps: Secondary | ICD-10-CM | POA: Diagnosis not present

## 2019-10-19 DIAGNOSIS — Z8673 Personal history of transient ischemic attack (TIA), and cerebral infarction without residual deficits: Secondary | ICD-10-CM | POA: Diagnosis not present

## 2019-10-19 DIAGNOSIS — E1136 Type 2 diabetes mellitus with diabetic cataract: Secondary | ICD-10-CM | POA: Diagnosis not present

## 2019-10-19 DIAGNOSIS — E119 Type 2 diabetes mellitus without complications: Secondary | ICD-10-CM | POA: Diagnosis not present

## 2019-10-19 DIAGNOSIS — Z79899 Other long term (current) drug therapy: Secondary | ICD-10-CM | POA: Diagnosis not present

## 2019-10-19 DIAGNOSIS — K219 Gastro-esophageal reflux disease without esophagitis: Secondary | ICD-10-CM | POA: Diagnosis not present

## 2019-10-19 DIAGNOSIS — M7989 Other specified soft tissue disorders: Secondary | ICD-10-CM | POA: Diagnosis not present

## 2019-10-19 DIAGNOSIS — I739 Peripheral vascular disease, unspecified: Secondary | ICD-10-CM | POA: Diagnosis not present

## 2019-10-19 DIAGNOSIS — I10 Essential (primary) hypertension: Secondary | ICD-10-CM | POA: Diagnosis not present

## 2019-10-19 DIAGNOSIS — Z794 Long term (current) use of insulin: Secondary | ICD-10-CM | POA: Diagnosis not present

## 2019-10-19 DIAGNOSIS — K922 Gastrointestinal hemorrhage, unspecified: Secondary | ICD-10-CM

## 2019-10-19 MED ORDER — FAMOTIDINE 20 MG PO TABS
20.0000 mg | ORAL_TABLET | Freq: Once | ORAL | Status: AC
  Administered 2019-10-19: 20 mg via ORAL

## 2019-10-19 MED ORDER — SODIUM CHLORIDE 0.9 % IV SOLN
510.0000 mg | Freq: Once | INTRAVENOUS | Status: AC
  Administered 2019-10-19: 510 mg via INTRAVENOUS
  Filled 2019-10-19: qty 510

## 2019-10-19 MED ORDER — SODIUM CHLORIDE 0.9 % IV SOLN
Freq: Once | INTRAVENOUS | Status: AC
  Filled 2019-10-19: qty 250

## 2019-10-19 MED ORDER — FAMOTIDINE 20 MG PO TABS
ORAL_TABLET | ORAL | Status: AC
  Filled 2019-10-19: qty 1

## 2019-10-19 MED ORDER — DIPHENHYDRAMINE HCL 50 MG/ML IJ SOLN
50.0000 mg | Freq: Once | INTRAMUSCULAR | Status: AC
  Administered 2019-10-19: 50 mg via INTRAVENOUS

## 2019-10-19 MED ORDER — DIPHENHYDRAMINE HCL 50 MG/ML IJ SOLN
INTRAMUSCULAR | Status: AC
  Filled 2019-10-19: qty 1

## 2019-10-19 NOTE — Patient Instructions (Signed)

## 2019-10-21 NOTE — Assessment & Plan Note (Signed)
BP Readings from Last 3 Encounters:  10/19/19 (!) 143/89  10/14/19 (!) 141/82  10/12/19 (!) 167/115   Ms. Thackeray denies any chest pain, palpitations but has noticed that her leg has been swelling. She endorses improvement in leg swelling with elevation though. She denies any SOB or orthopnea.   Assessment  Several BP readings at office visits with both IMTS and other specialities, including heme/onc, has shown elevated BP above goal. We discussed need for additional BP control and Ms. Mankowski is in agreement.   Likely diltiazem is not providing adequate blood pressure control. Will discontinue and opt for Chlorthalidone, which may also help with lower extremity swelling. Given risk of gout, will encourage patient re-start Allopurinol and Colchicine.   Plan:  - Discontinue Diltiazem - Begin Chlorthalidone 25 mg  - Titrate at next visit as needed to reach BP goal  - Continue Losartan 100 mg daily

## 2019-10-21 NOTE — Assessment & Plan Note (Signed)
>>  ASSESSMENT AND PLAN FOR TYPE 2 DIABETES MELLITUS WITH DIABETIC NEUROPATHY ([facility]) WRITTEN ON 10/21/2019 12:28 AM BY ARNETT SAUNDERS, MD  Lab Results  Component Value Date   HGBA1C 5.4 10/14/2019   [name] states she has been working on decreasing her carb intake daily, and has completely cut out fried food. She notes that she continues to check her CBGs at least twice daily. Average morning BG is 130 with a minimum of 117 in the past 1 month. Her CBG this AM was 120.   Assessment:  A1c within goal, although likely lower than expected due to chronic GI bleed requiring ferritin infusions. Continue with current regimen.   Plan:  - Metformin  1000 mg BID  - Trulicity  0.75 mg weekly

## 2019-10-21 NOTE — Assessment & Plan Note (Signed)
[name] declines any symptoms in 8 months. She has not been taking Allopurinol or Colchicine.   Assessment:  We are starting HTN treatment with thiazide, so will instruct patient to restart Allopurinol and Colchicine to avoid gout flare.    Plan:  - Allopurinol 100 mg daily  - Colchicine 0.6 mg daily

## 2019-10-21 NOTE — Assessment & Plan Note (Signed)
[name] endorses occasional substance use, particularly cocaine, with last occurrence on Mother's day. She endorses significant guilt towards this and has a support system in place.

## 2019-10-21 NOTE — Assessment & Plan Note (Signed)
Ms. Haspel requires new prosthetic liners today to prevent ulceration of the skin.   Plan:  - New liners ordered

## 2019-10-21 NOTE — Assessment & Plan Note (Signed)
Lab Results  Component Value Date   HGBA1C 5.4 10/14/2019   [name] states she has been working on decreasing her carb intake daily, and has completely cut out fried food. She notes that she continues to check her CBGs at least twice daily. Average morning BG is 130 with a minimum of 117 in the past 1 month. Her CBG this AM was 120.   Assessment:  A1c within goal, although likely lower than expected due to chronic GI bleed requiring ferritin infusions. Continue with current regimen.   Plan:  - Metformin 123XX123 mg BID  - Trulicity [id number] mg weekly

## 2019-10-21 NOTE — Progress Notes (Signed)
Internal Medicine Clinic Attending  Case discussed with Dr. Basaraba at the time of the visit.  We reviewed the resident's history and exam and pertinent patient test results.  I agree with the assessment, diagnosis, and plan of care documented in the resident's note.    

## 2019-10-22 ENCOUNTER — Other Ambulatory Visit: Payer: Self-pay | Admitting: Internal Medicine

## 2019-10-22 DIAGNOSIS — M109 Gout, unspecified: Secondary | ICD-10-CM

## 2019-10-24 ENCOUNTER — Other Ambulatory Visit: Payer: Self-pay | Admitting: Internal Medicine

## 2019-10-24 DIAGNOSIS — I10 Essential (primary) hypertension: Secondary | ICD-10-CM

## 2019-10-28 ENCOUNTER — Emergency Department (HOSPITAL_COMMUNITY): Payer: Medicare Other

## 2019-10-28 ENCOUNTER — Inpatient Hospital Stay (HOSPITAL_COMMUNITY)
Admission: EM | Admit: 2019-10-28 | Discharge: 2019-10-30 | DRG: 816 | Disposition: A | Discharge status: Home / Self Care | Payer: Medicare Other | Attending: Internal Medicine | Admitting: Internal Medicine

## 2019-10-28 ENCOUNTER — Telehealth: Payer: Self-pay | Admitting: *Deleted

## 2019-10-28 ENCOUNTER — Other Ambulatory Visit: Payer: Self-pay

## 2019-10-28 ENCOUNTER — Encounter (HOSPITAL_COMMUNITY): Payer: Self-pay | Admitting: Emergency Medicine

## 2019-10-28 ENCOUNTER — Observation Stay (HOSPITAL_COMMUNITY): Payer: Medicare Other

## 2019-10-28 DIAGNOSIS — R000 Tachycardia, unspecified: Secondary | ICD-10-CM | POA: Diagnosis not present

## 2019-10-28 DIAGNOSIS — Z79899 Other long term (current) drug therapy: Secondary | ICD-10-CM | POA: Diagnosis not present

## 2019-10-28 DIAGNOSIS — E1140 Type 2 diabetes mellitus with diabetic neuropathy, unspecified: Secondary | ICD-10-CM | POA: Diagnosis present

## 2019-10-28 DIAGNOSIS — Z8249 Family history of ischemic heart disease and other diseases of the circulatory system: Secondary | ICD-10-CM | POA: Diagnosis not present

## 2019-10-28 DIAGNOSIS — Z881 Allergy status to other antibiotic agents status: Secondary | ICD-10-CM | POA: Diagnosis not present

## 2019-10-28 DIAGNOSIS — D7389 Other diseases of spleen: Secondary | ICD-10-CM | POA: Diagnosis not present

## 2019-10-28 DIAGNOSIS — E1151 Type 2 diabetes mellitus with diabetic peripheral angiopathy without gangrene: Secondary | ICD-10-CM | POA: Diagnosis present

## 2019-10-28 DIAGNOSIS — K219 Gastro-esophageal reflux disease without esophagitis: Secondary | ICD-10-CM | POA: Diagnosis not present

## 2019-10-28 DIAGNOSIS — I10 Essential (primary) hypertension: Secondary | ICD-10-CM | POA: Diagnosis not present

## 2019-10-28 DIAGNOSIS — Z794 Long term (current) use of insulin: Secondary | ICD-10-CM

## 2019-10-28 DIAGNOSIS — H109 Unspecified conjunctivitis: Secondary | ICD-10-CM | POA: Diagnosis not present

## 2019-10-28 DIAGNOSIS — Z8619 Personal history of other infectious and parasitic diseases: Secondary | ICD-10-CM

## 2019-10-28 DIAGNOSIS — E876 Hypokalemia: Secondary | ICD-10-CM | POA: Diagnosis not present

## 2019-10-28 DIAGNOSIS — R079 Chest pain, unspecified: Secondary | ICD-10-CM | POA: Diagnosis present

## 2019-10-28 DIAGNOSIS — Z8673 Personal history of transient ischemic attack (TIA), and cerebral infarction without residual deficits: Secondary | ICD-10-CM | POA: Diagnosis not present

## 2019-10-28 DIAGNOSIS — S3600XA Unspecified injury of spleen, initial encounter: Secondary | ICD-10-CM | POA: Diagnosis present

## 2019-10-28 DIAGNOSIS — E1122 Type 2 diabetes mellitus with diabetic chronic kidney disease: Secondary | ICD-10-CM | POA: Diagnosis present

## 2019-10-28 DIAGNOSIS — Z885 Allergy status to narcotic agent status: Secondary | ICD-10-CM | POA: Diagnosis not present

## 2019-10-28 DIAGNOSIS — Z89612 Acquired absence of left leg above knee: Secondary | ICD-10-CM

## 2019-10-28 DIAGNOSIS — R0789 Other chest pain: Secondary | ICD-10-CM | POA: Diagnosis not present

## 2019-10-28 DIAGNOSIS — Z743 Need for continuous supervision: Secondary | ICD-10-CM | POA: Diagnosis not present

## 2019-10-28 DIAGNOSIS — R0602 Shortness of breath: Secondary | ICD-10-CM | POA: Diagnosis not present

## 2019-10-28 DIAGNOSIS — Z809 Family history of malignant neoplasm, unspecified: Secondary | ICD-10-CM

## 2019-10-28 DIAGNOSIS — D735 Infarction of spleen: Principal | ICD-10-CM | POA: Diagnosis present

## 2019-10-28 DIAGNOSIS — R1012 Left upper quadrant pain: Secondary | ICD-10-CM | POA: Diagnosis present

## 2019-10-28 DIAGNOSIS — R9431 Abnormal electrocardiogram [ECG] [EKG]: Secondary | ICD-10-CM

## 2019-10-28 DIAGNOSIS — Z20822 Contact with and (suspected) exposure to covid-19: Secondary | ICD-10-CM | POA: Diagnosis not present

## 2019-10-28 DIAGNOSIS — Z888 Allergy status to other drugs, medicaments and biological substances status: Secondary | ICD-10-CM

## 2019-10-28 DIAGNOSIS — M109 Gout, unspecified: Secondary | ICD-10-CM | POA: Diagnosis present

## 2019-10-28 DIAGNOSIS — Z7982 Long term (current) use of aspirin: Secondary | ICD-10-CM | POA: Diagnosis not present

## 2019-10-28 DIAGNOSIS — F17210 Nicotine dependence, cigarettes, uncomplicated: Secondary | ICD-10-CM | POA: Diagnosis present

## 2019-10-28 DIAGNOSIS — R109 Unspecified abdominal pain: Secondary | ICD-10-CM | POA: Diagnosis not present

## 2019-10-28 DIAGNOSIS — I700 Atherosclerosis of aorta: Secondary | ICD-10-CM | POA: Diagnosis not present

## 2019-10-28 DIAGNOSIS — Z880 Allergy status to penicillin: Secondary | ICD-10-CM

## 2019-10-28 DIAGNOSIS — F1010 Alcohol abuse, uncomplicated: Secondary | ICD-10-CM | POA: Diagnosis present

## 2019-10-28 DIAGNOSIS — I4581 Long QT syndrome: Secondary | ICD-10-CM | POA: Diagnosis not present

## 2019-10-28 HISTORY — DX: Chest pain, unspecified: R07.9

## 2019-10-28 HISTORY — DX: Hypocalcemia: E83.51

## 2019-10-28 LAB — TROPONIN I (HIGH SENSITIVITY)
Troponin I (High Sensitivity): 24 ng/L — ABNORMAL HIGH (ref <18)
Troponin I (High Sensitivity): 26 ng/L — ABNORMAL HIGH (ref <18)

## 2019-10-28 LAB — HEPATIC FUNCTION PANEL
ALT: 13 U/L (ref 0–44)
AST: 23 U/L (ref 15–41)
Albumin: 2.8 g/dL — ABNORMAL LOW (ref 3.5–5.0)
Alkaline Phosphatase: 53 U/L (ref 38–126)
Bilirubin, Direct: <0.1 mg/dL (ref 0.0–0.2)
Total Bilirubin: 0.4 mg/dL (ref 0.3–1.2)
Total Protein: 6 g/dL — ABNORMAL LOW (ref 6.5–8.1)

## 2019-10-28 LAB — CBC
HCT: 47.6 % — ABNORMAL HIGH (ref 36.0–46.0)
Hemoglobin: 14.9 g/dL (ref 12.0–15.0)
MCH: 29.3 pg (ref 26.0–34.0)
MCHC: 31.3 g/dL (ref 30.0–36.0)
MCV: 93.5 fL (ref 80.0–100.0)
Platelets: 381 10*3/uL (ref 150–400)
RBC: 5.09 MIL/uL (ref 3.87–5.11)
RDW: 17.4 % — ABNORMAL HIGH (ref 11.5–15.5)
WBC: 7.8 10*3/uL (ref 4.0–10.5)
nRBC: 0 % (ref 0.0–0.2)

## 2019-10-28 LAB — BASIC METABOLIC PANEL
Anion gap: 14 (ref 5–15)
BUN: 5 mg/dL — ABNORMAL LOW (ref 8–23)
CO2: 29 mmol/L (ref 22–32)
Calcium: 6 mg/dL — CL (ref 8.9–10.3)
Chloride: 100 mmol/L (ref 98–111)
Creatinine, Ser: 0.96 mg/dL (ref 0.44–1.00)
GFR calc Af Amer: >60 mL/min (ref >60)
GFR calc non Af Amer: >60 mL/min (ref >60)
Glucose, Bld: 97 mg/dL (ref 70–99)
Potassium: 3 mmol/L — ABNORMAL LOW (ref 3.5–5.1)
Sodium: 143 mmol/L (ref 135–145)

## 2019-10-28 LAB — CBG MONITORING, ED: Glucose-Capillary: 170 mg/dL — ABNORMAL HIGH (ref 70–99)

## 2019-10-28 LAB — MAGNESIUM: Magnesium: 1 mg/dL — ABNORMAL LOW (ref 1.7–2.4)

## 2019-10-28 LAB — GLUCOSE, CAPILLARY: Glucose-Capillary: 126 mg/dL — ABNORMAL HIGH (ref 70–99)

## 2019-10-28 LAB — LIPASE, BLOOD: Lipase: 53 U/L — ABNORMAL HIGH (ref 11–51)

## 2019-10-28 MED ORDER — POTASSIUM CHLORIDE 10 MEQ/100ML IV SOLN
10.0000 meq | INTRAVENOUS | Status: DC
  Administered 2019-10-28 (×2): 10 meq via INTRAVENOUS
  Filled 2019-10-28 (×2): qty 100

## 2019-10-28 MED ORDER — GABAPENTIN 400 MG PO CAPS
400.0000 mg | ORAL_CAPSULE | Freq: Once | ORAL | Status: AC
  Administered 2019-10-28: 400 mg via ORAL
  Filled 2019-10-28: qty 1

## 2019-10-28 MED ORDER — MAGNESIUM SULFATE 4 GM/100ML IV SOLN
4.0000 g | Freq: Once | INTRAVENOUS | Status: AC
  Administered 2019-10-29: 4 g via INTRAVENOUS
  Filled 2019-10-28: qty 100

## 2019-10-28 MED ORDER — GABAPENTIN 400 MG PO CAPS
400.0000 mg | ORAL_CAPSULE | Freq: Two times a day (BID) | ORAL | Status: DC
  Administered 2019-10-29 – 2019-10-30 (×4): 400 mg via ORAL
  Filled 2019-10-28 (×4): qty 1

## 2019-10-28 MED ORDER — HYDROMORPHONE HCL 1 MG/ML IJ SOLN
0.5000 mg | Freq: Three times a day (TID) | INTRAMUSCULAR | Status: DC | PRN
  Administered 2019-10-28 – 2019-10-29 (×2): 0.5 mg via INTRAVENOUS
  Filled 2019-10-28 (×2): qty 1

## 2019-10-28 MED ORDER — SODIUM CHLORIDE 0.9% FLUSH
3.0000 mL | Freq: Once | INTRAVENOUS | Status: AC
  Administered 2019-10-28: 3 mL via INTRAVENOUS

## 2019-10-28 MED ORDER — TRAZODONE HCL 50 MG PO TABS
50.0000 mg | ORAL_TABLET | Freq: Every evening | ORAL | Status: DC | PRN
  Administered 2019-10-28 – 2019-10-29 (×2): 50 mg via ORAL
  Filled 2019-10-28 (×2): qty 1

## 2019-10-28 MED ORDER — LORATADINE 10 MG PO TABS
10.0000 mg | ORAL_TABLET | Freq: Every day | ORAL | Status: DC
  Administered 2019-10-29 – 2019-10-30 (×2): 10 mg via ORAL
  Filled 2019-10-28 (×2): qty 1

## 2019-10-28 MED ORDER — THIAMINE HCL 100 MG PO TABS
100.0000 mg | ORAL_TABLET | Freq: Every day | ORAL | Status: DC
  Administered 2019-10-28 – 2019-10-30 (×3): 100 mg via ORAL
  Filled 2019-10-28 (×3): qty 1

## 2019-10-28 MED ORDER — IOHEXOL 350 MG/ML SOLN
80.0000 mL | Freq: Once | INTRAVENOUS | Status: AC | PRN
  Administered 2019-10-28: 80 mL via INTRAVENOUS

## 2019-10-28 MED ORDER — ASPIRIN EC 81 MG PO TBEC
81.0000 mg | DELAYED_RELEASE_TABLET | Freq: Every day | ORAL | Status: DC
  Administered 2019-10-29 – 2019-10-30 (×2): 81 mg via ORAL
  Filled 2019-10-28 (×2): qty 1

## 2019-10-28 MED ORDER — INSULIN ASPART 100 UNIT/ML ~~LOC~~ SOLN
0.0000 [IU] | Freq: Three times a day (TID) | SUBCUTANEOUS | Status: DC
  Administered 2019-10-29: 2 [IU] via SUBCUTANEOUS
  Administered 2019-10-29: 5 [IU] via SUBCUTANEOUS
  Administered 2019-10-30: 3 [IU] via SUBCUTANEOUS

## 2019-10-28 MED ORDER — POTASSIUM CHLORIDE 10 MEQ/100ML IV SOLN
10.0000 meq | INTRAVENOUS | Status: AC
  Administered 2019-10-28 – 2019-10-29 (×2): 10 meq via INTRAVENOUS
  Filled 2019-10-28 (×2): qty 100

## 2019-10-28 MED ORDER — GABAPENTIN 400 MG PO CAPS
800.0000 mg | ORAL_CAPSULE | Freq: Every day | ORAL | Status: DC
  Administered 2019-10-28 – 2019-10-29 (×2): 800 mg via ORAL
  Filled 2019-10-28 (×3): qty 2

## 2019-10-28 MED ORDER — LOSARTAN POTASSIUM 50 MG PO TABS
100.0000 mg | ORAL_TABLET | Freq: Every day | ORAL | Status: DC
  Administered 2019-10-29 – 2019-10-30 (×2): 100 mg via ORAL
  Filled 2019-10-28 (×2): qty 2

## 2019-10-28 MED ORDER — CALCIUM GLUCONATE-NACL 2-0.675 GM/100ML-% IV SOLN
2.0000 g | Freq: Once | INTRAVENOUS | Status: AC
  Administered 2019-10-28: 2000 mg via INTRAVENOUS
  Filled 2019-10-28: qty 100

## 2019-10-28 MED ORDER — MAGNESIUM SULFATE 2 GM/50ML IV SOLN
2.0000 g | Freq: Once | INTRAVENOUS | Status: AC
  Administered 2019-10-28: 2 g via INTRAVENOUS
  Filled 2019-10-28: qty 50

## 2019-10-28 NOTE — Telephone Encounter (Signed)
Agree. thanks

## 2019-10-28 NOTE — ED Notes (Signed)
c-t coming back to take pt there

## 2019-10-28 NOTE — ED Notes (Signed)
Not given.

## 2019-10-28 NOTE — H&P (Addendum)
Date: 10/28/2019               Patient Name:  [name] MRN: [id number]  DOB: [birth year] Age / Sex: 69 y.o., female   PCP: Jose Persia, MD         Medical Service: Internal Medicine Teaching Service         Attending Physician: Dr. Aldine Contes, MD    First Contact: Dr. Madilyn Fireman  Pager: [phone]  Second Contact: Dr. Sherry Ruffing Pager: [phone]        After Hours (After 5p/  First Contact Pager: [phone]  weekends / holidays): Second Contact Pager: [phone]   Chief Complaint: Chest pain  History of Present Illness: Patient states that she in her usual state of health until Sunday when she began experiencing a "bubble in her chest." It was located underneath her left breast and was non-radiating. She states that it felt as if "there was a burp in her chest." It was intermittent (episodes lasting about 5 minutes) and patient also experienced numbness and tingling in her hands. Her discomfort was not exacerbated by movement but does mention that it does hurt to lie on her left side. Since she has not eaten since Sunday, so she is unaware if her pain is exacerbated with food ingestion. She denies any prior episodes in the past but does report an unclear heart issue after her hospitalization for her amputation. She denies past coronary angiography but reports they were considering doing some sort of revascularization procedure. Patient reports 50 year history of cigarette usage and a family history significant for greater than 3 first degree relatives with heart attacks at age less than 65. In addition to the chest discomfort, she reports experiencing 2 episodes of diarrhea today.  Meds:  *Acetaminophen as needed *Zyrtec 10 mg daily *Vitamin D 1000 units daily *Vitamin C 500 mg every morning *Allopurinol 20 mg daily *Colchicine 0.6 mg daily *Aspirin 81 mg daily *Chlorthalidone 12.5 mg daily (started end of May 2021) *Losartan 123XX123 mg daily *Trulicity A999333 mg once  weekly *Metformin 1000 mg twice daily *Reports that she does not take fluoxetine *Trazodone 50 mg (1 tablet before bed) *Gabapentin 400 mg a.m., 400 mg afternoon, 800 mg before bed *Omeprazole 20 mg daily  Allergies: Allergies as of 10/28/2019 - Review Complete 10/28/2019  Allergen Reaction Noted  . Ciprofloxacin Hives and Swelling 12/25/2011  . Morphine and related Other (See Comments) 12/25/2011  . Penicillins Hives and Swelling   . Codeine Hives and Swelling   . Lisinopril Cough 01/04/2016  . Feraheme [ferumoxytol] Itching 11/17/2012   Past Medical History:  Diagnosis Date  . Allergic rhinitis   . Anemia, iron deficiency 05/03/2011   2/2 GI AVMs - recieves iron infusions as well as periodic red blood cell transfusions  . Anxiety   . Arteriovenous malformation of duodenum 02/25/2015  . CARPAL TUNNEL RELEASE, HX OF 07/22/2008  . Colon polyps   . Diabetes mellitus without complication ([place])    type 2  . Difficulty sleeping    takes trazadone for sleep  . Diverticulosis   . GERD (gastroesophageal reflux disease)   . Hepatitis C    in SVR with [facility] 12/2014-02/2015.   Marland Kitchen History of hernia repair 08/18/2012  . History of porphyria 11/30/2014   Porphyria cutanea tarda in setting of chronic Hepatitis C, frequent  IV iron infusions, and alcohol abuse   . Hypertension   . Peripheral vascular disease ([place])   . Stroke [facility]) 2010  no residual deficits  . Substance use disorder    cocaine  . Tachycardia 07/10/2017  . Trichomonas vaginitis 04/16/2019    Family History:  Family History  Problem Relation Age of Onset  . Cancer Father        unknown  . Hypertension Mother   . Diverticulitis Mother   * Heart diseases in multiple first-degree family members (2 members at ages 21, 71)  Social History:  *Cigarette use since age 25, 1 pack per week  *Current EtOH Use, few drinks per day (eg. 2-3 drinks liquor or a bottle of wine), last drink yesterday  *Current crack cocaine usage,  reports no recent usage in relationship to onset of chest pain *She lives by herself, has a caregiver that is present for 5 hours/day for 7 days/week  Review of Systems: A complete ROS was negative except as per HPI.   Physical Exam: Blood pressure (!) 159/102, pulse 82, temperature 98.1 F (36.7 C), temperature source Oral, resp. rate 16, SpO2 97 %. Physical Exam  Constitutional: She is well-developed, well-nourished, and in no distress.  HENT:  Head: Normocephalic and atraumatic.  Eyes: EOM are normal. Right eye exhibits no discharge. Left eye exhibits no discharge.  Neck: No tracheal deviation present.  Cardiovascular: Normal rate and regular rhythm. Exam reveals no gallop and no friction rub.  No murmur heard. Pulmonary/Chest: Effort normal and breath sounds normal. No respiratory distress. She has no wheezes. She has no rales.  Abdominal: Soft. She exhibits no distension. There is no abdominal tenderness. There is no rebound and no guarding.  Musculoskeletal:        General: Tenderness (Chest wall) and deformity (Left AKA) present. No edema. Normal range of motion.     Cervical back: Normal range of motion.  Neurological: She is alert. Coordination normal.  Skin: Skin is warm and dry. No rash noted. She is not diaphoretic. No erythema.  Psychiatric: Memory and judgment normal.   EKG: personally reviewed my interpretation is NSR without evidence for acute ischemia, QTC approximately 510 msec  CXR: personally reviewed my interpretation is no acute cardiopulmonary process  CTA Chest PE (10/28/19): IMPRESSION: 1. Limited evaluation of new medial splenic hypodensity measuring 5.3 cm. Differential includes subcapsular hematoma versus soft tissue mass. Further evaluation with dedicated CT abdomen pelvis is recommended. 2.  No evidence of pulmonary embolism.  No acute airspace disease. 3.  Chronic large left apical pneumatocele.  CT Abd/Pelvis WO Contrast (10/28/19): IMPRESSION: 1.  A 5.8 x 2.0 cm complex fluid collection along the medial spleen, likely a subacute or old subcapsular hematoma. Follow-up recommended. 2. No bowel obstruction. Normal appendix. 3. Aortic Atherosclerosis (ICD10-I70.0).   CBC Latest Ref Rng & Units 10/28/2019 10/12/2019 09/11/2019  WBC 4.0 - 10.5 K/uL 7.8 8.3 8.5  Hemoglobin 12.0 - 15.0 g/dL 14.9 13.0 11.0(L)  Hematocrit 36.0 - 46.0 % 47.6(H) 40.5 36.0  Platelets 150 - 400 K/uL 381 338 390   BMP Latest Ref Rng & Units 10/28/2019 07/20/2019 07/15/2019  Glucose 70 - 99 mg/dL 97 82 92  BUN 8 - 23 mg/dL 5(L) 16 13  Creatinine 0.44 - 1.00 mg/dL 0.96 1.12(H) 0.97  BUN/Creat Ratio 12 - 28 - - 13  Sodium 135 - 145 mmol/L 143 144 143  Potassium 3.5 - 5.1 mmol/L 3.0(L) 3.9 4.1  Chloride 98 - 111 mmol/L 100 106 103  CO2 22 - 32 mmol/L 29 26 26   Calcium 8.9 - 10.3 mg/dL 6.0(LL) 8.6(L) 8.9   Troponin: 24 ->  26 Lipase: 53   Assessment & Plan by Problem: Principal Problem:   Chest pain Active Problems:   Essential hypertension, benign   Type 2 diabetes mellitus with diabetic neuropathy (HCC)   Hypokalemia   Hypocalcemia   LUQ pain   Spleen injury  Patient is a [age]-year-old female with past medical history significant for hypertension, diabetes, GERD, and cocaine use who presents with a 3-day history of chest discomfort and 1 day of diarrhea.  # Chest pain: Chest pain characteristics are atypical for cardiac etiology, but patient has multiple risk factors including hypertension, tobacco use, and family history.  Initial EKG is without evidence for acute ischemia, and troponins are mildly elevated but flat at 24 -> 26. CTA was negative for PE but did reveal splenic hypodensity which may be causing referred chest pain. CT abd/pelvis demonstrated 5.8x2.0 cm complex fluid collection along medial spleen. We spoke with Dr. Barry Dienes with general surgery who recommends outpatient followup CT abd/pel with contrast to occur in 6-8 weeks. *Telemetry *Repeat  EKG in AM  # Hypokalemia: # Hypocalcemia: # Diarrhea: Potassium of 3.0 on presentation, calcium of 6.0 (corrected of ~7.0 with albumin 2.8). 2g calcium gluconate given. Hypokalemia may be 2/2 to chlorthalidone side-effect vs. GI losses. *Potassium IV 10 mEq Q1HR x4 *Check Magnesium *Check ionized calcium  # Hypertension: Restart losartan in AM, hold chlorthalidone given hypokalemia  # [id number]: Takes trulicity and metformin at home. Last HgbA1C of 5.4 on 10/14/19 * SSI   Diet: Carb modified DVT Ppx: SCDs, no pharmacologic VTE ppx at this time because of possible splenic hematoma CODE STATUS: FULL CODE Dispo: Admit patient to Observation with expected length of stay less than 2 midnights.  Signed: Jeanmarie Hubert, MD 10/28/2019, 8:42 PM

## 2019-10-28 NOTE — ED Triage Notes (Signed)
Per EMS- pt here from home for eval of cp to left breast w some shortness of breath. 20G PIV to left AC. Took 81 mg asprin and 1 nitro w relief.

## 2019-10-28 NOTE — ED Provider Notes (Signed)
[facility] EMERGENCY DEPARTMENT Provider Note   CSN: [id number] Arrival date & time: 10/28/19  1135     History Chief Complaint  Patient presents with  . Chest Pain    [name] is a 69 y.o. female presenting for evaluation of chest pain and shortness of breath.  Patient states her symptoms began all of a sudden 4 days ago.  She reports left-sided chest pain, which is constant but waxing and waning in severity.  It does not radiate.  She has associated shortness of breath.  She denies fevers, chills, cough, nausea, vomiting, abdominal pain.  She has not taken anything for her symptoms.  She denies sick contacts.  She denies previous history of cardiac problems.  She states her diabetes is well controlled.  She thinks she may have had a blood clot in her left leg prior to amputation, but is not sure.  She is not on blood thinners.  She was took 1 baby aspirin this morning, however was unable to take the remaining 3 baby aspirin with EMS due to being adentulous.  She denies increased pain with inspiration initially, but then states she is short of breath due to the pain.  Additional history taken chart review.  Patient with a history of anemia, anxiety, diabetes, hep C, hypertension, PVD, previous stroke without deficits.  She is status post left leg amputation due to necrotizing fasciitis.   HPI     Past Medical History:  Diagnosis Date  . Allergic rhinitis   . Anemia, iron deficiency 05/03/2011   2/2 GI AVMs - recieves iron infusions as well as periodic red blood cell transfusions  . Anxiety   . Arteriovenous malformation of duodenum 02/25/2015  . CARPAL TUNNEL RELEASE, HX OF 07/22/2008  . Colon polyps   . Diabetes mellitus without complication ([place])    type 2  . Difficulty sleeping    takes trazadone for sleep  . Diverticulosis   . GERD (gastroesophageal reflux disease)   . Hepatitis C    in SVR with Harvoni 12/2014-02/2015.   Marland Kitchen History of hernia repair  08/18/2012  . History of porphyria 11/30/2014   Porphyria cutanea tarda in setting of chronic Hepatitis C, frequent  IV iron infusions, and alcohol abuse   . Hypertension   . Peripheral vascular disease ([place])   . Stroke [facility]) 2010   no residual deficits  . Substance use disorder    cocaine  . Tachycardia 07/10/2017  . Trichomonas vaginitis 04/16/2019    Patient Active Problem List   Diagnosis Date Noted  . Chest pain 10/28/2019  . Microscopic hematuria 07/17/2019  . Sleep disorder 02/04/2019  . Kidney disease 02/04/2019  . Tinea corporis 06/26/2018  . Generalized anxiety disorder 07/24/2017  . Tobacco use disorder 07/24/2017  . Atherosclerosis of abdominal aorta ([place]) 10/25/2016  . Gout involving toe of right foot 02/28/2016  . Alcohol use 07/16/2015  . History of hepatitis C 03/15/2015  . Substance use disorder 03/05/2015  . Iron deficiency anemia due to chronic blood loss 02/23/2015  . Vitamin D deficiency 12/02/2014  . Preventative health care 11/30/2014  . Depression 07/10/2013  . Type 2 diabetes mellitus with diabetic neuropathy ([place]) 06/07/2013  . Above knee amputation of left lower extremity ([place]) 08/18/2012  . Angiodysplasia of duodenum 08/03/2011  . Essential hypertension, benign 07/22/2008  . GERD 07/22/2008    Past Surgical History:  Procedure Laterality Date  . ABDOMINAL HYSTERECTOMY    . APPLICATION OF WOUND VAC N/A  06/21/2014   Procedure: APPLICATION OF WOUND VAC;  Surgeon: Coralie Keens, MD;  Location: [facility];  Service: General;  Laterality: N/A;  . CARPAL TUNNEL RELEASE     rt hand  . COLONOSCOPY WITH PROPOFOL N/A 03/17/2018   Procedure: COLONOSCOPY WITH PROPOFOL;  Surgeon: Doran Stabler, MD;  Location: [facility];  Service: Gastroenterology;  Laterality: N/A;  . ENTEROSCOPY N/A 12/04/2012   Procedure: ENTEROSCOPY;  Surgeon: Beryle Beams, MD;  Location: [facility];  Service: Endoscopy;  Laterality: N/A;  . ENTEROSCOPY N/A 12/18/2012   Procedure:  ENTEROSCOPY;  Surgeon: Beryle Beams, MD;  [facility];  Service: Endoscopy;  Laterality: N/A;  . ENTEROSCOPY N/A 02/25/2015   Procedure: ENTEROSCOPY;  Surgeon: Inda Castle, MD;  Location: [facility] ENDOSCOPY;  Service: Endoscopy;  Laterality: N/A;  . ENTEROSCOPY N/A 10/29/2017   Procedure: ENTEROSCOPY;  Surgeon: Doran Stabler, MD;  [facility];  Service: Gastroenterology;  Laterality: N/A;  . ESOPHAGOGASTRODUODENOSCOPY  05/05/2011   Procedure: ESOPHAGOGASTRODUODENOSCOPY (EGD);  Surgeon: Zenovia Jarred, MD;  Location: [facility];  Service: Gastroenterology;  Laterality: N/A;  Dr. Hilarie Fredrickson will do procedure for Dr. Benson Norway Saturday.  . ESOPHAGOGASTRODUODENOSCOPY  05/08/2011   Procedure: ESOPHAGOGASTRODUODENOSCOPY (EGD);  Surgeon: Beryle Beams;  [facility];  Service: Endoscopy;  Laterality: N/A;  . ESOPHAGOGASTRODUODENOSCOPY  06/07/2011   Procedure: ESOPHAGOGASTRODUODENOSCOPY (EGD);  Surgeon: Beryle Beams, MD;  Location: [facility];  Service: Endoscopy;  Laterality: N/A;  . ESOPHAGOGASTRODUODENOSCOPY  12/20/2011   Procedure: ESOPHAGOGASTRODUODENOSCOPY (EGD);  Surgeon: Beryle Beams, MD;  Location: [facility];  Service: Endoscopy;  Laterality: N/A;  . EYE SURGERY Bilateral    cataracts  . FLEXIBLE SIGMOIDOSCOPY  12/21/2011   Procedure: FLEXIBLE SIGMOIDOSCOPY;  Surgeon: Beryle Beams, MD;  [facility];  Service: Endoscopy;  Laterality: N/A;  . HOT HEMOSTASIS  06/07/2011   Procedure: HOT HEMOSTASIS (ARGON PLASMA COAGULATION/BICAP);  Surgeon: Beryle Beams, MD;  Location: [facility];  Service: Endoscopy;  Laterality: N/A;  . INCISIONAL HERNIA REPAIR N/A 06/01/2014   Procedure: ATTEMPTED LAPAROSCOPIC AND OPEN INCISIONAL HERNIA REPAIR WITH MESH;  Surgeon: Coralie Keens, MD;  Location: [facility];  Service: General;  Laterality: N/A;  . INSERTION OF MESH N/A 06/01/2014   Procedure: INSERTION OF MESH;  Surgeon: Coralie Keens, MD;  Location: [facility];  Service:  General;  Laterality: N/A;  . LAPAROTOMY  02/16/2012   Procedure: EXPLORATORY LAPAROTOMY;  Surgeon: Edward Jolly, MD;  Location: [facility];  Service: General;  Laterality: N/A;  oversewing of anastomotic leak and rigid sigmoidoscopy  . LAPAROTOMY N/A 06/21/2014   Procedure: ABDOMINAL WOUND EXPLORATION;  Surgeon: Coralie Keens, MD;  Location: [facility];  Service: General;  Laterality: N/A;  . LEG AMPUTATION ABOVE KNEE     left  . PARTIAL COLECTOMY  02/15/2012   Procedure: PARTIAL COLECTOMY;  Surgeon: Harl Bowie, MD;  Location: [facility];  Service: General;  Laterality: N/A;  . TONSILLECTOMY       OB History   No obstetric history on file.     Family History  Problem Relation Age of Onset  . Cancer Father        unknown  . Hypertension Mother   . Diverticulitis Mother     Social History   Tobacco Use  . Smoking status: Current Some Day Smoker    Packs/day: 0.50    Years: 17.00    Pack years: 8.50    Types: Cigarettes  . Smokeless tobacco: Never  Used  . Tobacco comment: 1 pack last 2 weeks.  Substance Use Topics  . Alcohol use: Yes    Alcohol/week: 0.0 standard drinks  . Drug use: Yes    Types: Cocaine, Marijuana    Comment: Sometimes.    Home Medications Prior to Admission medications   Medication Sig Start Date End Date Taking? Authorizing Provider  acetaminophen (TYLENOL) 500 MG tablet Take 1,000 mg by mouth every 6 (six) hours as needed for mild pain.   Yes [provider]  allopurinol (ZYLOPRIM) 100 MG tablet TAKE 1 TABLET (100 MG TOTAL) BY MOUTH DAILY. 10/23/19  Yes Jose Persia, MD  aspirin EC 81 MG tablet Take 1 tablet (81 mg total) by mouth daily. 03/04/15  Yes Burgess Estelle, MD  cetirizine (ZYRTEC) 10 MG tablet TAKE 1 TABLET BY MOUTH DAILY Patient taking differently: Take 10 mg by mouth daily.  10/01/19  Yes Jose Persia, MD  chlorthalidone (HYGROTON) 25 MG tablet Take 0.5 tablets (12.5 mg total) by mouth daily. 10/14/19 04/11/20 Yes Jose Persia, MD  cholecalciferol (VITAMIN D) 1000 units tablet Take 1 tablet (1,000 Units total) by mouth daily. 10/04/16  Yes [name], MD  colchicine 0.6 MG tablet TAKE 1 TABLET (0.6 MG TOTAL) BY MOUTH DAILY. 10/19/19  Yes Jose Persia, MD  diphenhydramine-acetaminophen (TYLENOL PM) 25-500 MG TABS tablet Take 1 tablet by mouth at bedtime as needed (sleep).   Yes [provider]  Dulaglutide (TRULICITY) 2.50 NL/9.7QB SOPN Inject 0.75 mg into the skin once a week. Patient taking differently: Inject 0.75 mg into the skin once a week. Sunday 10/14/19  Yes Jose Persia, MD  feeding supplement, GLUCERNA SHAKE, (GLUCERNA SHAKE) LIQD Take 237 mLs by mouth 3 (three) times daily between meals. Patient taking differently: Take 237 mLs by mouth 2 (two) times daily between meals.  10/04/16  Yes [name], MD  FLUoxetine (PROZAC) 20 MG capsule TAKE 3 CAPSULES BY MOUTH DAILY Patient taking differently: Take 90 mg by mouth daily.  09/02/18  Yes [name], MD  gabapentin (NEURONTIN) 400 MG capsule Take 1 capsule in the morning, 1 in the afternoon and 2 before bedtime Patient taking differently: Take 400-800 mg by mouth See admin instructions. Take 1 capsule (448m) in the morning, 1 capsule (40103m in the afternoon and 2  Capsules (80053mbefore bedtime 10/14/19  Yes BasJose PersiaD  losartan (COZAAR) 100 MG tablet Take 1 tablet (100 mg total) by mouth daily. 10/14/19  Yes BasJose PersiaD  metFORMIN (GLUCOPHAGE) 1000 MG tablet TAKE 1 TABLET BY MOUTH TWICE A DAY WITH A MEAL Patient taking differently: Take 1,000 mg by mouth 2 (two) times daily with a meal. TAKE 1 TABLET BY MOUTH TWICE A DAY WITH A MEAL 10/14/19  Yes BasJose PersiaD  omeprazole (PRILOSEC) 20 MG capsule Take 1 capsule (20 mg total) by mouth daily. 08/27/19  Yes BasJose PersiaD  traZODone (DESYREL) 50 MG tablet TAKE 2 TABLETS BY MOUTH AT BEDTIME Patient taking differently: Take 50 mg by mouth at bedtime as needed for  sleep.  07/28/19  Yes BasJose PersiaD  vitamin C (ASCORBIC ACID) 500 MG tablet Take 500 mg by mouth every morning.   Yes [provider]  ACCU-CHEK FASTCLIX LANCETS MISC Use to test blood glucose 3 times daily. 10/04/16   SvaAlphonzo GrieveD  ACCU-CHEK GUIDE test strip CHECK BLOOD SUGAR 3 TIMES A DAY 11/25/18   RaiOda KiltsD  Blood Glucose Monitoring Suppl (ACCU-CHEK GUIDE) w/Device KIT 1  each by Does not apply route 3 (three) times daily. The patient is insulin requiring, ICD 10 code E11.65. The patient tests 3 times a day. 02/05/19   Jose Persia, MD  Insulin Pen Needle (UNIFINE PENTIPS) 32G X 4 MM MISC Use to inject insulin once a day. The patient is insulin requiring, ICD 10 code E11.9. The patient injects 1 times per day. 02/03/19   Jose Persia, MD  tolnaftate (TINACTIN) 1 % cream Apply 1 application topically 2 (two) times daily. Patient not taking: Reported on 10/28/2019 01/07/18   Landis Martins, DPM    Allergies    Ciprofloxacin, Morphine and related, Penicillins, Codeine, Lisinopril, and Feraheme [ferumoxytol]  Review of Systems   Review of Systems  Respiratory: Positive for shortness of breath.   Cardiovascular: Positive for chest pain.  All other systems reviewed and are negative.   Physical Exam Updated Vital Signs BP (!) 159/102   Pulse 73   Temp 98.1 F (36.7 C) (Oral)   Resp 15   SpO2 96%   Physical Exam Vitals and nursing note reviewed.  Constitutional:      General: She is not in acute distress.    Appearance: She is well-developed.     Comments: appears nontoxic  HENT:     Head: Normocephalic and atraumatic.  Eyes:     Extraocular Movements: Extraocular movements intact.     Conjunctiva/sclera: Conjunctivae normal.     Pupils: Pupils are equal, round, and reactive to light.  Cardiovascular:     Rate and Rhythm: Normal rate and regular rhythm.     Pulses: Normal pulses.  Pulmonary:     Effort: Pulmonary effort is normal. No  respiratory distress.     Breath sounds: Wheezing present.     Comments: Scattered expiratory wheezing. speaking in full sentences. TTP of L side chest wall Chest:     Chest wall: Tenderness present.  Abdominal:     General: There is no distension.     Palpations: Abdomen is soft. There is no mass.     Tenderness: There is no abdominal tenderness. There is no guarding or rebound.  Musculoskeletal:        General: Normal range of motion.     Cervical back: Normal range of motion and neck supple.     Comments: Left leg amputation Tenderness palpation of right calf.  No contusions.  Pedal pulse 2+.   Skin:    General: Skin is warm and dry.     Capillary Refill: Capillary refill takes less than 2 seconds.  Neurological:     Mental Status: She is alert and oriented to person, place, and time.     ED Results / Procedures / Treatments   Labs (all labs ordered are listed, but only abnormal results are displayed) Labs Reviewed  BASIC METABOLIC PANEL - Abnormal; Notable for the following components:      Result Value   Potassium 3.0 (*)    BUN 5 (*)    Calcium 6.0 (*)    All other components within normal limits  CBC - Abnormal; Notable for the following components:   HCT 47.6 (*)    RDW 17.4 (*)    All other components within normal limits  HEPATIC FUNCTION PANEL - Abnormal; Notable for the following components:   Total Protein 6.0 (*)    Albumin 2.8 (*)    All other components within normal limits  LIPASE, BLOOD - Abnormal; Notable for the following components:   Lipase 53 (*)  All other components within normal limits  CBG MONITORING, ED - Abnormal; Notable for the following components:   Glucose-Capillary 170 (*)    All other components within normal limits  TROPONIN I (HIGH SENSITIVITY) - Abnormal; Notable for the following components:   Troponin I (High Sensitivity) 24 (*)    All other components within normal limits  TROPONIN I (HIGH SENSITIVITY) - Abnormal; Notable  for the following components:   Troponin I (High Sensitivity) 26 (*)    All other components within normal limits  SARS CORONAVIRUS 2 (TAT 6-24 HRS)  MAGNESIUM  COMPREHENSIVE METABOLIC PANEL  CBC  CALCIUM, IONIZED    EKG EKG Interpretation  Date/Time:  Wednesday October 28 2019 11:34:51 EDT Ventricular Rate:  98 PR Interval:  190 QRS Duration: 78 QT Interval:  410 QTC Calculation: 523 R Axis:   33 Text Interpretation: Normal sinus rhythm Prolonged QT Abnormal ECG Confirmed by Gerlene Fee [phone]) on 10/28/2019 2:01:46 PM   Radiology DG Chest 2 View  Result Date: 10/28/2019 CLINICAL DATA:  Pain/PRESSURE under left breast w some shortness of breath. HX GERD EXAM: CHEST - 2 VIEW COMPARISON:  Chest radiograph 08/17/2016 FINDINGS: Stable cardiomediastinal contours. Lungs are clear. No pneumothorax or pleural effusion. There is no acute finding in the visualized skeleton. IMPRESSION: No acute cardiopulmonary process. Electronically Signed   By: Audie Pinto M.D.   On: 10/28/2019 13:09   CT Angio Chest PE W and/or Wo Contrast  Result Date: 10/28/2019 CLINICAL DATA:  Shortness of breath and left chest pain. EXAM: CT ANGIOGRAPHY CHEST WITH CONTRAST TECHNIQUE: Multidetector CT imaging of the chest was performed using the standard protocol during bolus administration of intravenous contrast. Multiplanar CT image reconstructions and MIPs were obtained to evaluate the vascular anatomy. CONTRAST:  68m OMNIPAQUE IOHEXOL 350 MG/ML SOLN COMPARISON:  Prior same day chest radiograph. 06/23/2018 complete abdominal ultrasound. 10/03/2016 CT abdomen pelvis. FINDINGS: Cardiovascular: There are no filling defects within the pulmonary arterial system. Normal caliber thoracic aorta with mild calcified atheromatous plaque. Normal heart size. No pericardial effusion. Mediastinum/Nodes: No mediastinal or axillary adenopathy. Normal thyroid gland. The trachea and esophagus are unremarkable. Lungs/Pleura: Chronic  large left apical pneumatocele with mild peripheral compressive atelectasis. Dependent and bibasilar atelectasis. Smaller left lower lobe pneumatocele. No pneumothorax or pleural effusion. Upper Abdomen: New 5.3 x 2.1 cm medial splenic hypodensity (5:100). Musculoskeletal: External soft tissues are unremarkable. Multilevel spondylosis. No acute or suspicious bone lesion identified. Review of the MIP images confirms the above findings. IMPRESSION: 1. Limited evaluation of new medial splenic hypodensity measuring 5.3 cm. Differential includes subcapsular hematoma versus soft tissue mass. Further evaluation with dedicated CT abdomen pelvis is recommended. 2.  No evidence of pulmonary embolism.  No acute airspace disease. 3.  Chronic large left apical pneumatocele. Electronically Signed   By: CPrimitivo GauzeM.D.   On: 10/28/2019 17:33    Procedures .Critical Care Performed by: CFranchot Heidelberg PA-C Authorized by: CFranchot Heidelberg PA-C   Critical care provider statement:    Critical care time (minutes):  65   Critical care time was exclusive of:  Separately billable procedures and treating other patients and teaching time   Critical care was necessary to treat or prevent imminent or life-threatening deterioration of the following conditions:  Metabolic crisis   Critical care was time spent personally by me on the following activities:  Blood draw for specimens, development of treatment plan with patient or surrogate, evaluation of patient's response to treatment, examination of patient, obtaining history from patient  or surrogate, ordering and performing treatments and interventions, ordering and review of laboratory studies, ordering and review of radiographic studies, pulse oximetry, re-evaluation of patient's condition and review of old charts   I assumed direction of critical care for this patient from another provider in my specialty: no   Comments:     Pt with critically low calcium causing  cramps/contractrures. Prolonged qtc on EKG. IV calcium given and pt admitted.    (including critical care time)  Medications Ordered in ED Medications  thiamine tablet 100 mg (has no administration in time range)  potassium chloride 10 mEq in 100 mL IVPB (has no administration in time range)  insulin aspart (novoLOG) injection 0-15 Units (has no administration in time range)  aspirin EC tablet 81 mg (has no administration in time range)  loratadine (CLARITIN) tablet 10 mg (has no administration in time range)  gabapentin (NEURONTIN) capsule 400 mg (has no administration in time range)  losartan (COZAAR) tablet 100 mg (has no administration in time range)  traZODone (DESYREL) tablet 50 mg (has no administration in time range)  gabapentin (NEURONTIN) capsule 800 mg (has no administration in time range)  sodium chloride flush (NS) 0.9 % injection 3 mL (3 mLs Intravenous Given 10/28/19 1437)  calcium gluconate 2 g/ 100 mL sodium chloride IVPB (0 mg Intravenous Stopped 10/28/19 1529)  magnesium sulfate IVPB 2 g 50 mL (0 g Intravenous Stopped 10/28/19 1825)  iohexol (OMNIPAQUE) 350 MG/ML injection 80 mL (80 mLs Intravenous Contrast Given 10/28/19 1647)  gabapentin (NEURONTIN) capsule 400 mg (400 mg Oral Given 10/28/19 1825)    ED Course  I have reviewed the triage vital signs and the nursing notes.  Pertinent labs & imaging results that were available during my care of the patient were reviewed by me and considered in my medical decision making (see chart for details).    MDM Rules/Calculators/A&P                      Patient presenting for evaluation of chest pain and shortness of breath.  On exam, patient appears nontoxic.  However she does have tenderness palpation of the right calf, initially was tachycardic, and has chest pain and shortness of breath.  As such, concern for PE.  Also consider ACS due to risk factors such as diabetes, previous stroke, hypertension.  Will obtain labs, EKG, chest  x-ray, and CTA.  Case discussed with attending, Dr. Sedonia Small evaluated the patient.  Labs interpreted by me, shows mildly elevated troponin at 24.  Will trend.  Calcium critically low at 6.0. on reassessment, pt states she has been having cramps/contractures of her R hand.  EKG shows prolonged QT, IV calcium given.  Patient will need to be admitted for electrolyte collection.  Otherwise labs are stable.  Chest x-ray viewed interpreted by me, no pneumonia, torso effusion, cardiomegaly.  Lipase mildly elevated, however patient without epigastric abdominal pain and I have low suspicion for pancreatitis at this time.  CTA negative for PE.  Does show a lesion on the spleen, consider hematoma or soft tissue growth.  Patient denies any recent trauma.  She has no contusions on her skin.  Her platelets are normal.  As such, favor mass over hematoma.  Discussed findings with patient, likelihood for need for more imaging for work-up of this lesion.  Will call for admission for hypocalcemia, chest pain with a heart score of 6, and new splenic lesion.  Discussed with internal medicine teaching service, patient to  be admitted.  Final Clinical Impression(s) / ED Diagnoses Final diagnoses:  Hypocalcemia  Atypical chest pain  Splenic lesion  Prolonged QT interval    Rx / DC Orders ED Discharge Orders    None       Franchot Heidelberg, PA-C 10/28/19 2013    Maudie Flakes, MD 11/02/19 [phone]

## 2019-10-28 NOTE — ED Notes (Signed)
Pt refused because it does not look like she takes at home

## 2019-10-28 NOTE — ED Notes (Signed)
pharmacinst has been called and he reports that this is the same pill given across the board  When it went to tell her  She decided to take the medicine after all

## 2019-10-28 NOTE — ED Notes (Signed)
Report given to rn on 6e 

## 2019-10-28 NOTE — ED Notes (Signed)
THE PT IS REFUSING TO KEEP THE BP CUFF ON SHE REPORTS THAT IT IS PAINFUL  CUFF REMOVED

## 2019-10-28 NOTE — ED Notes (Signed)
Pt upset angry wanting to leave without any more care   The pa talked to her and the pt will  Stay if she gets food  Pt taken to c-t

## 2019-10-28 NOTE — ED Notes (Signed)
Admitting doctors seeing pt

## 2019-10-28 NOTE — Progress Notes (Addendum)
Address BP with on call MD for internal med Dr. Benjamine Mola.  Stated that if patient took her BP medicine today that they will restart her back in the morning.  I asked [name] if she did, she stated she did.  Per MD asked, if she is having any symptoms, I stated no.  MD stated that to monitor BP and she will restart medicine in the am.

## 2019-10-28 NOTE — Telephone Encounter (Signed)
Pt calls crying, short of breath noticeable, states it started Sunday and got worse now she cant hardly breathe, she is alone, denies COVID s/s, states she has had a couple diarrhea stools. Triage called 911 and gave them info, stayed on ph with pt until fire dept arrived. Nurse ask 911 for pt to be brought to [facility] if possible.

## 2019-10-28 NOTE — ED Notes (Addendum)
c-t came to get this pt for c-0t  But the calcium had to run in before she could go  They will be back

## 2019-10-29 ENCOUNTER — Telehealth: Payer: Self-pay | Admitting: Internal Medicine

## 2019-10-29 DIAGNOSIS — Z7982 Long term (current) use of aspirin: Secondary | ICD-10-CM | POA: Diagnosis not present

## 2019-10-29 DIAGNOSIS — Z8673 Personal history of transient ischemic attack (TIA), and cerebral infarction without residual deficits: Secondary | ICD-10-CM | POA: Diagnosis not present

## 2019-10-29 DIAGNOSIS — Z881 Allergy status to other antibiotic agents status: Secondary | ICD-10-CM | POA: Diagnosis not present

## 2019-10-29 DIAGNOSIS — F1010 Alcohol abuse, uncomplicated: Secondary | ICD-10-CM | POA: Diagnosis present

## 2019-10-29 DIAGNOSIS — I700 Atherosclerosis of aorta: Secondary | ICD-10-CM | POA: Diagnosis present

## 2019-10-29 DIAGNOSIS — Z8619 Personal history of other infectious and parasitic diseases: Secondary | ICD-10-CM | POA: Diagnosis not present

## 2019-10-29 DIAGNOSIS — Z89612 Acquired absence of left leg above knee: Secondary | ICD-10-CM | POA: Diagnosis not present

## 2019-10-29 DIAGNOSIS — E876 Hypokalemia: Secondary | ICD-10-CM | POA: Diagnosis present

## 2019-10-29 DIAGNOSIS — Z794 Long term (current) use of insulin: Secondary | ICD-10-CM | POA: Diagnosis not present

## 2019-10-29 DIAGNOSIS — Z79899 Other long term (current) drug therapy: Secondary | ICD-10-CM | POA: Diagnosis not present

## 2019-10-29 DIAGNOSIS — Z809 Family history of malignant neoplasm, unspecified: Secondary | ICD-10-CM | POA: Diagnosis not present

## 2019-10-29 DIAGNOSIS — M109 Gout, unspecified: Secondary | ICD-10-CM | POA: Diagnosis present

## 2019-10-29 DIAGNOSIS — S3600XA Unspecified injury of spleen, initial encounter: Secondary | ICD-10-CM | POA: Diagnosis not present

## 2019-10-29 DIAGNOSIS — E1151 Type 2 diabetes mellitus with diabetic peripheral angiopathy without gangrene: Secondary | ICD-10-CM | POA: Diagnosis not present

## 2019-10-29 DIAGNOSIS — Z885 Allergy status to narcotic agent status: Secondary | ICD-10-CM | POA: Diagnosis not present

## 2019-10-29 DIAGNOSIS — K219 Gastro-esophageal reflux disease without esophagitis: Secondary | ICD-10-CM | POA: Diagnosis present

## 2019-10-29 DIAGNOSIS — F17210 Nicotine dependence, cigarettes, uncomplicated: Secondary | ICD-10-CM | POA: Diagnosis present

## 2019-10-29 DIAGNOSIS — E1140 Type 2 diabetes mellitus with diabetic neuropathy, unspecified: Secondary | ICD-10-CM | POA: Diagnosis present

## 2019-10-29 DIAGNOSIS — I10 Essential (primary) hypertension: Secondary | ICD-10-CM | POA: Diagnosis not present

## 2019-10-29 DIAGNOSIS — D735 Infarction of spleen: Secondary | ICD-10-CM | POA: Diagnosis not present

## 2019-10-29 DIAGNOSIS — H109 Unspecified conjunctivitis: Secondary | ICD-10-CM | POA: Diagnosis not present

## 2019-10-29 DIAGNOSIS — Z20822 Contact with and (suspected) exposure to covid-19: Secondary | ICD-10-CM | POA: Diagnosis not present

## 2019-10-29 DIAGNOSIS — Z8249 Family history of ischemic heart disease and other diseases of the circulatory system: Secondary | ICD-10-CM | POA: Diagnosis not present

## 2019-10-29 DIAGNOSIS — R079 Chest pain, unspecified: Secondary | ICD-10-CM | POA: Diagnosis present

## 2019-10-29 LAB — CBC
HCT: 41.7 % (ref 36.0–46.0)
Hemoglobin: 13.2 g/dL (ref 12.0–15.0)
MCH: 29.3 pg (ref 26.0–34.0)
MCHC: 31.7 g/dL (ref 30.0–36.0)
MCV: 92.7 fL (ref 80.0–100.0)
Platelets: 325 10*3/uL (ref 150–400)
RBC: 4.5 MIL/uL (ref 3.87–5.11)
RDW: 17.1 % — ABNORMAL HIGH (ref 11.5–15.5)
WBC: 6.8 10*3/uL (ref 4.0–10.5)
nRBC: 0 % (ref 0.0–0.2)

## 2019-10-29 LAB — CBC WITH DIFFERENTIAL/PLATELET
Abs Immature Granulocytes: 0.05 10*3/uL (ref 0.00–0.07)
Basophils Absolute: 0 10*3/uL (ref 0.0–0.1)
Basophils Relative: 1 %
Eosinophils Absolute: 0.1 10*3/uL (ref 0.0–0.5)
Eosinophils Relative: 1 %
HCT: 42.2 % (ref 36.0–46.0)
Hemoglobin: 13.4 g/dL (ref 12.0–15.0)
Immature Granulocytes: 1 %
Lymphocytes Relative: 13 %
Lymphs Abs: 0.9 10*3/uL (ref 0.7–4.0)
MCH: 29.3 pg (ref 26.0–34.0)
MCHC: 31.8 g/dL (ref 30.0–36.0)
MCV: 92.3 fL (ref 80.0–100.0)
Monocytes Absolute: 0.9 10*3/uL (ref 0.1–1.0)
Monocytes Relative: 14 %
Neutro Abs: 4.6 10*3/uL (ref 1.7–7.7)
Neutrophils Relative %: 70 %
Platelets: 316 10*3/uL (ref 150–400)
RBC: 4.57 MIL/uL (ref 3.87–5.11)
RDW: 17.1 % — ABNORMAL HIGH (ref 11.5–15.5)
WBC: 6.5 10*3/uL (ref 4.0–10.5)
nRBC: 0 % (ref 0.0–0.2)

## 2019-10-29 LAB — BASIC METABOLIC PANEL
Anion gap: 11 (ref 5–15)
BUN: 6 mg/dL — ABNORMAL LOW (ref 8–23)
CO2: 28 mmol/L (ref 22–32)
Calcium: 7 mg/dL — ABNORMAL LOW (ref 8.9–10.3)
Chloride: 100 mmol/L (ref 98–111)
Creatinine, Ser: 0.85 mg/dL (ref 0.44–1.00)
GFR calc Af Amer: >60 mL/min (ref >60)
GFR calc non Af Amer: >60 mL/min (ref >60)
Glucose, Bld: 179 mg/dL — ABNORMAL HIGH (ref 70–99)
Potassium: 2.9 mmol/L — ABNORMAL LOW (ref 3.5–5.1)
Sodium: 139 mmol/L (ref 135–145)

## 2019-10-29 LAB — COMPREHENSIVE METABOLIC PANEL
ALT: 12 U/L (ref 0–44)
AST: 18 U/L (ref 15–41)
Albumin: 2.6 g/dL — ABNORMAL LOW (ref 3.5–5.0)
Alkaline Phosphatase: 51 U/L (ref 38–126)
Anion gap: 9 (ref 5–15)
BUN: <5 mg/dL — ABNORMAL LOW (ref 8–23)
CO2: 28 mmol/L (ref 22–32)
Calcium: 5.9 mg/dL — CL (ref 8.9–10.3)
Chloride: 103 mmol/L (ref 98–111)
Creatinine, Ser: 0.84 mg/dL (ref 0.44–1.00)
GFR calc Af Amer: >60 mL/min (ref >60)
GFR calc non Af Amer: >60 mL/min (ref >60)
Glucose, Bld: 138 mg/dL — ABNORMAL HIGH (ref 70–99)
Potassium: 2.7 mmol/L — CL (ref 3.5–5.1)
Sodium: 140 mmol/L (ref 135–145)
Total Bilirubin: 0.7 mg/dL (ref 0.3–1.2)
Total Protein: 5.4 g/dL — ABNORMAL LOW (ref 6.5–8.1)

## 2019-10-29 LAB — PROTIME-INR
INR: 1 (ref 0.8–1.2)
Prothrombin Time: 12.7 seconds (ref 11.4–15.2)

## 2019-10-29 LAB — GLUCOSE, CAPILLARY
Glucose-Capillary: 173 mg/dL — ABNORMAL HIGH (ref 70–99)
Glucose-Capillary: 214 mg/dL — ABNORMAL HIGH (ref 70–99)
Glucose-Capillary: 121 mg/dL — ABNORMAL HIGH (ref 70–99)
Glucose-Capillary: 145 mg/dL — ABNORMAL HIGH (ref 70–99)

## 2019-10-29 LAB — SARS CORONAVIRUS 2 (TAT 6-24 HRS): SARS Coronavirus 2: NEGATIVE

## 2019-10-29 LAB — PHOSPHORUS: Phosphorus: 4.1 mg/dL (ref 2.5–4.6)

## 2019-10-29 LAB — MAGNESIUM: Magnesium: 2.2 mg/dL (ref 1.7–2.4)

## 2019-10-29 MED ORDER — POTASSIUM CHLORIDE 10 MEQ/100ML IV SOLN
10.0000 meq | INTRAVENOUS | Status: AC
  Administered 2019-10-29 (×4): 10 meq via INTRAVENOUS
  Filled 2019-10-29 (×4): qty 100

## 2019-10-29 MED ORDER — POTASSIUM CHLORIDE 10 MEQ/100ML IV SOLN
10.0000 meq | INTRAVENOUS | Status: DC
  Administered 2019-10-29: 10 meq via INTRAVENOUS

## 2019-10-29 MED ORDER — CALCIUM GLUCONATE-NACL 2-0.675 GM/100ML-% IV SOLN
2.0000 g | Freq: Once | INTRAVENOUS | Status: AC
  Administered 2019-10-29: 2000 mg via INTRAVENOUS
  Filled 2019-10-29: qty 100

## 2019-10-29 MED ORDER — HYDROMORPHONE HCL 1 MG/ML IJ SOLN: 0.5000 mg | Freq: Three times a day (TID) | INTRAMUSCULAR | Status: DC | PRN

## 2019-10-29 MED ORDER — AMLODIPINE BESYLATE 5 MG PO TABS
5.0000 mg | ORAL_TABLET | Freq: Every day | ORAL | Status: DC
  Administered 2019-10-29: 5 mg via ORAL
  Filled 2019-10-29: qty 1

## 2019-10-29 MED ORDER — LABETALOL HCL 5 MG/ML IV SOLN
5.0000 mg | Freq: Once | INTRAVENOUS | Status: AC
  Administered 2019-10-29: 5 mg via INTRAVENOUS
  Filled 2019-10-29: qty 4

## 2019-10-29 MED ORDER — AMLODIPINE BESYLATE 10 MG PO TABS
10.0000 mg | ORAL_TABLET | Freq: Every day | ORAL | Status: DC
  Administered 2019-10-30: 10 mg via ORAL
  Filled 2019-10-29: qty 1

## 2019-10-29 MED ORDER — POTASSIUM CHLORIDE 20 MEQ PO PACK
40.0000 meq | PACK | Freq: Once | ORAL | Status: AC
  Administered 2019-10-29: 40 meq via ORAL
  Filled 2019-10-29: qty 2

## 2019-10-29 MED ORDER — HYDROCODONE-ACETAMINOPHEN 5-325 MG PO TABS
1.0000 | ORAL_TABLET | Freq: Four times a day (QID) | ORAL | Status: DC | PRN
  Administered 2019-10-30: 1 via ORAL
  Filled 2019-10-29: qty 1

## 2019-10-29 MED ORDER — CALCIUM CARBONATE ANTACID 500 MG PO CHEW: 1.0000 | CHEWABLE_TABLET | Freq: Three times a day (TID) | ORAL | Status: DC

## 2019-10-29 MED ORDER — ACETAMINOPHEN 325 MG PO TABS
650.0000 mg | ORAL_TABLET | Freq: Four times a day (QID) | ORAL | Status: DC | PRN
  Administered 2019-10-29: 650 mg via ORAL
  Filled 2019-10-29: qty 2

## 2019-10-29 MED ORDER — POTASSIUM CHLORIDE CRYS ER 20 MEQ PO TBCR
40.0000 meq | EXTENDED_RELEASE_TABLET | ORAL | Status: DC
  Administered 2019-10-29: 40 meq via ORAL
  Filled 2019-10-29: qty 2

## 2019-10-29 MED ORDER — POTASSIUM CHLORIDE 20 MEQ/15ML (10%) PO SOLN
30.0000 meq | Freq: Once | ORAL | Status: AC
  Administered 2019-10-29: 30 meq via ORAL
  Filled 2019-10-29: qty 30

## 2019-10-29 MED ORDER — POTASSIUM CHLORIDE 10 MEQ/100ML IV SOLN
10.0000 meq | INTRAVENOUS | Status: AC
  Administered 2019-10-29 (×2): 10 meq via INTRAVENOUS
  Filled 2019-10-29 (×3): qty 100

## 2019-10-29 MED ORDER — KETOTIFEN FUMARATE 0.025 % OP SOLN
1.0000 [drp] | Freq: Two times a day (BID) | OPHTHALMIC | Status: DC
  Administered 2019-10-29 – 2019-10-30 (×2): 1 [drp] via OPHTHALMIC
  Filled 2019-10-29: qty 5

## 2019-10-29 MED ORDER — LORAZEPAM 2 MG/ML IJ SOLN
0.5000 mg | Freq: Once | INTRAMUSCULAR | Status: AC
  Administered 2019-10-29: 0.5 mg via INTRAVENOUS
  Filled 2019-10-29: qty 1

## 2019-10-29 MED ORDER — MAGNESIUM SULFATE 2 GM/50ML IV SOLN: 2.0000 g | Freq: Once | INTRAVENOUS | Status: DC

## 2019-10-29 MED ORDER — POTASSIUM CITRATE-CITRIC ACID 1100-334 MG/5ML PO SOLN: 10.0000 meq | Freq: Three times a day (TID) | ORAL | Status: DC

## 2019-10-29 MED ORDER — POTASSIUM CHLORIDE 20 MEQ PO PACK
30.0000 meq | PACK | Freq: Once | ORAL | Status: DC
  Filled 2019-10-29: qty 2

## 2019-10-29 NOTE — Progress Notes (Signed)
PT Cancellation Note  Patient Details Name: [name] MRN: [id number] DOB: [birth year]   Cancelled Treatment:    Reason Eval/Treat Not Completed: Medical issues which prohibited therapy Pt BP elevated (SBP>180 and DBP >100). Will hold until pt medically appropriate and follow up as schedule allows.   Reuel Derby, PT, DPT  Acute Rehabilitation Services  Pager: [phone] Office: [phone]    Rudean Hitt 10/29/2019, 2:12 PM

## 2019-10-29 NOTE — Telephone Encounter (Signed)
Returned call to [place] at [facility]. States they received faxed order for prosthetic liners on 10/14/2019 and then faxed request to [place] for CMN and LMN on 10/20/2019. Have not received this. Sharyn Lull will refax today. As PCP is unavailable at present will place in 10/14/2019 Attending's box for review and signature. Hubbard Hartshorn, BSN, RN-BC

## 2019-10-29 NOTE — Telephone Encounter (Signed)
Rec'd call from [facility] at the [facility] following up on a DME order faxed on 10/20/2019.  Please call back.

## 2019-10-29 NOTE — Discharge Summary (Signed)
Name: [name] MRN: [id number] DOB: 09/11/1950 69 y.o. PCP: Jose Persia, MD  Date of Admission: 10/28/2019 11:35 AM Date of Discharge: 10/30/2019 Attending Physician: Dr. Dareen Piano  Discharge Diagnosis: 1. Subcapsular hematoma in the spleen  Discharge Medications: Allergies as of 10/30/2019      Reactions   Ciprofloxacin Hives, Swelling   Morphine And Related Other (See Comments)   Makes pt sad and paranoid   Penicillins Hives, Swelling   Has patient had a PCN reaction causing immediate rash, facial/tongue/throat swelling, SOB or lightheadedness with hypotension:YES Has patient had a PCN reaction causing severe rash involving mucus membranes or skin necrosis:UNSURE Has patient had a PCN reaction that required hospitalization:YES Has patient had a PCN reaction occurring within the last 10 years:No If all of the above answers are "NO", then may proceed with Cephalosporin use.   Codeine Hives, Swelling   Lisinopril Cough   New onset dry cough after starting lisinopril. Resolved upon switching to losartan.   Feraheme [ferumoxytol] Itching   Tolerated Feraheme 6/26 & 7/21 pre-medications prior to medication      Medication List    STOP taking these medications   acetaminophen 500 MG tablet Commonly known as: TYLENOL   chlorthalidone 25 MG tablet Commonly known as: HYGROTON     TAKE these medications   Accu-Chek FastClix Lancets Misc Use to test blood glucose 3 times daily.   Accu-Chek Guide test strip Generic drug: glucose blood CHECK BLOOD SUGAR 3 TIMES A DAY   Accu-Chek Guide w/Device Kit 1 each by Does not apply route 3 (three) times daily. The patient is insulin requiring, ICD 10 code E11.65. The patient tests 3 times a day.   allopurinol 100 MG tablet Commonly known as: ZYLOPRIM TAKE 1 TABLET (100 MG TOTAL) BY MOUTH DAILY.   aspirin EC 81 MG tablet Take 1 tablet (81 mg total) by mouth daily.   cetirizine 10 MG tablet Commonly known as: ZYRTEC TAKE 1  TABLET BY MOUTH DAILY   cholecalciferol 25 MCG (1000 UNIT) tablet Commonly known as: VITAMIN D Take 1 tablet (1,000 Units total) by mouth daily.   colchicine 0.6 MG tablet TAKE 1 TABLET (0.6 MG TOTAL) BY MOUTH DAILY.   diphenhydramine-acetaminophen 25-500 MG Tabs tablet Commonly known as: TYLENOL PM Take 1 tablet by mouth at bedtime as needed (sleep).   feeding supplement (GLUCERNA SHAKE) Liqd Take 237 mLs by mouth 3 (three) times daily between meals. What changed: when to take this   gabapentin 400 MG capsule Commonly known as: NEURONTIN Take 1 capsule in the morning, 1 in the afternoon and 2 before bedtime What changed:   how much to take  how to take this  when to take this  additional instructions   losartan 100 MG tablet Commonly known as: COZAAR Take 1 tablet (100 mg total) by mouth daily.   metFORMIN 1000 MG tablet Commonly known as: GLUCOPHAGE TAKE 1 TABLET BY MOUTH TWICE A DAY WITH A MEAL What changed:   how much to take  how to take this  when to take this   omeprazole 20 MG capsule Commonly known as: PRILOSEC Take 1 capsule (20 mg total) by mouth daily.   Unifine Pentips 32G X 4 MM Misc Generic drug: Insulin Pen Needle Use to inject insulin once a day. The patient is insulin requiring, ICD 10 code E11.9. The patient injects 1 times per day.   vitamin C 500 MG tablet Commonly known as: ASCORBIC ACID Take 500 mg by mouth  every morning.     ASK your doctor about these medications   HYDROcodone-acetaminophen 5-325 MG tablet Commonly known as: NORCO/VICODIN Take 1 tablet by mouth every 4 (four) hours as needed for up to 3 days for moderate pain. Ask about: Should I take this medication?       Disposition and follow-up:   [name] was discharged from [facility] in Good condition.  At the hospital follow up visit please address:  1.  Follow up" A. CT scan in 6-8 weeks.   2.  Labs / imaging needed at time of  follow-up: cbc,bmp  3.  Pending labs/ test needing follow-up: none  Follow-up Appointments:   Hospital Course by problem list: [name] is a 68 year old female with a past medical history significant for diabetes mellitus, hypertension, and alcohol use disorder who presented with left-sided chest and abdominal pain. CT imaging revealed a 5.8 x 2.0 cm complex fluid collection along the medial spleen, indicating a subacute or old subcapsular hematoma.  # Subcapsular hematoma of spleen: Patient presented to [facility] with chest pain and was concerned about MI. Work up negative for MI. CT scan showed splenic hematoma, which is likely causing her referred pain. She was reassured and given pain medicine. She was discharged and instructed to follow up with another CT scan in 6-8 weeks.    Discharge Vitals:   BP (!) 159/108   Pulse 89   Temp 99.1 F (37.3 C) (Oral)   Resp 14   Ht '5\' 2"'$  (1.575 m)   Wt 75.7 kg   SpO2 (!) 86%   BMI 30.53 kg/m   Pertinent Labs, Studies, and Procedures:  CBC Latest Ref Rng & Units 10/29/2019 10/29/2019 10/28/2019  WBC 4.0 - 10.5 K/uL 6.5 6.8 7.8  Hemoglobin 12.0 - 15.0 g/dL 13.4 13.2 14.9  Hematocrit 36 - 46 % 42.2 41.7 47.6(H)  Platelets 150 - 400 K/uL 316 325 381   CMP Latest Ref Rng & Units 11/02/2019 10/30/2019 10/29/2019  Glucose 65 - 99 mg/dL 89 135(H) 179(H)  BUN 8 - 27 mg/dL 15 <5(L) 6(L)  Creatinine 0.57 - 1.00 mg/dL 1.20(H) 0.82 0.85  Sodium 134 - 144 mmol/L 140 139 139  Potassium 3.5 - 5.2 mmol/L 3.9 3.5 2.9(L)  Chloride 96 - 106 mmol/L 102 102 100  CO2 20 - 29 mmol/L '23 26 28  '$ Calcium 8.7 - 10.3 mg/dL 8.4(L) 7.1(L) 7.0(L)  Total Protein 6.5 - 8.1 g/dL - - -  Total Bilirubin 0.3 - 1.2 mg/dL - - -  Alkaline Phos 38 - 126 U/L - - -  AST 15 - 41 U/L - - -  ALT 0 - 44 U/L - - -     CTA Chest PE (10/28/19): IMPRESSION: 1. Limited evaluation of new medial splenic hypodensity measuring 5.3 cm. Differential includes subcapsular hematoma versus  soft tissue mass. Further evaluation with dedicated CT abdomen pelvis is recommended. 2. No evidence of pulmonary embolism. No acute airspace disease. 3. Chronic large left apical pneumatocele.  CT Abd/Pelvis WO Contrast (10/28/19): IMPRESSION: 1. A 5.8 x 2.0 cm complex fluid collection along the medial spleen, likely a subacute or old subcapsular hematoma. Follow-up recommended. 2. No bowel obstruction. Normal appendix. 3. Aortic Atherosclerosis (ICD10-I70.0).  Discharge Instructions: Discharge Instructions    Diet - low sodium heart healthy   Complete by: As directed    Diet - low sodium heart healthy   Complete by: As directed    Diet - low  sodium heart healthy   Complete by: As directed    Discharge instructions   Complete by: As directed    You were hospitalized for spleen hematoma and electrolyte repletion. Thank you for allowing [place] to be part of your care.   We arranged for you to follow up at:  1. [facility] - Monday  06/07 at 9:00 am  Please note these changes made to your medications:   Please START taking:  1. Amlodipine 10 mg daily 2. Hydrocodone- acetaminophen 5-'325mg'$ , 1 every 4-6 hours as needed for pain.  Please STOP taking:  1. Chlorthalidone   Please make sure to follow up on Monday.  Please call our clinic if you have any questions or concerns, we may be able to help and keep you from a long and expensive emergency room wait. Our clinic and after hours phone number is [phone], the best time to call is Monday through Friday 9 am to 4 pm but there is always someone available 24/7 if you have an emergency. If you need medication refills please notify your pharmacy one week in advance and they will send [place] a request.   Discharge instructions   Complete by: As directed    You were hospitalized for spleen hematoma and electrolyte repletion. Thank you for allowing [place] to be part of your care.   We arranged for you to follow up at:  1.  [facility] - Monday 06/07 at 9:45 am   Please note these changes made to your medications:   Please START taking:  1. Amlodipine 10 mg daily  2. Hydrocodone- acetaminophen 5-'325mg'$ , 1 every 4-6 hours as needed for pain.   Please STOP taking:  1. Chlorthalidone   Please make sure to follow up on Monday.   Please call our clinic if you have any questions or concerns, we may be able to help and keep you from a long and expensive emergency room wait. Our clinic and after hours phone number is [phone], the best time to call is Monday through Friday 9 am to 4 pm but there is always someone available 24/7 if you have an emergency. If you need medication refills please notify your pharmacy one week in advance and they will send [place] a request.   Increase activity slowly   Complete by: As directed    Increase activity slowly   Complete by: As directed    Increase activity slowly   Complete by: As directed       Signed: Marianna Payment, MD 11/08/2019, 10:33 AM   Pager: [phone]

## 2019-10-29 NOTE — Care Management Obs Status (Signed)
[place] NOTIFICATION   Patient Details  Name: [name] MRN: [id number] Date of Birth: 1951-05-07   Medicare Observation Status Notification Given:  Yes    Bartholomew Crews, RN 10/29/2019, 3:35 PM

## 2019-10-29 NOTE — Progress Notes (Signed)
Brief History:  Shakesha Jacobi is a 69 year old female with a past medical history significant for diabetes mellitus, hypertension, and alcohol use disorder who presented with left-sided chest and abdominal pain.  Subjective:  Ms. Larrison was evaluated and examined this morning at bedside. Patient was sitting upright in the bed. She states that she is still experiencing chest and abdominal pain. She describes the pain as a "bubble" in her chest. She also reports episodes of emesis, and that she had vomited up her potassium pills this morning.  We discussed the CT imaging results with the patient and the importance of outpatient follow-up imaging in 6-8 weeks to monitor progression/resolution of suspected splenic hematoma. Patient denied any recent falls or trauma.  Patient was concerned about the temporary change in her hyperglycemia medications to the inpatient sliding-scale insulin. We informed the patient that we are not permanently changing her medications, and that she will be started on her home medications on discharge.  We discussed with the patient that we will be discontinuing the Chlorthalidone that she began taking 09/2019 outpatient.  Objective:  Vital signs in last 24 hours: Vitals:   10/28/19 2218 10/29/19 0022 10/29/19 0327 10/29/19 0759  BP: (!) 162/132 (!) 191/113 (!) 161/95 (!) 145/72  Pulse: 75 76 87 88  Resp: 19 17 17 18   Temp: 98.6 F (37 C) 97.9 F (36.6 C) 98.3 F (36.8 C) 98.7 F (37.1 C)  TempSrc: Oral Oral Oral Oral  SpO2: 95% 92% 96% 98%  Weight:      Height:       Weight change:   Intake/Output Summary (Last 24 hours) at 10/29/2019 1032 Last data filed at 10/29/2019 0355 Gross per 24 hour  Intake 740 ml  Output 1250 ml  Net -510 ml   Physical Exam  Constitutional: She is oriented to person, place, and time.  Mild distress.  HENT:  Head: Normocephalic and atraumatic.  Eyes: EOM are normal.  Cardiovascular: Normal rate, regular rhythm and  normal heart sounds. Exam reveals no gallop and no friction rub.  No murmur heard. Pulmonary/Chest: Effort normal and breath sounds normal. No respiratory distress. She has no wheezes. She has no rales.  Abdominal: Soft. Bowel sounds are normal. There is abdominal tenderness. There is no rebound and no guarding.  RUQ tenderness to palpation.  Neurological: She is alert and oriented to person, place, and time.  Skin: Skin is warm and dry. No rash noted. She is not diaphoretic. No erythema.  No bruising or discoloration of abdomen.  Psychiatric: Mood and affect normal.      Assessment/Plan:  Principal Problem:   Spleen injury Active Problems:   Essential hypertension, benign   Type 2 diabetes mellitus with diabetic neuropathy (HCC)   Hypokalemia   Chest pain   Hypocalcemia   LUQ pain  Girtha Sergent is a 69 year old female with a past medical history significant for diabetes mellitus, hypertension, and alcohol use disorder who presented with left-sided chest and abdominal pain. CT imaging revealed a 5.8 x 2.0 cm complex fluid collection along the medial spleen, indicating a subacute or old subcapsular hematoma.  # Chest pain: Patient still endorsing left-sided chest and upper abdominal pain. EKG does not illustrate signs of acute ischemia, and troponin levels mildly elevated but stable at 24 and 26 since admission. CTA negative for PE. CT indicated subacute/old subcapsular hematoma. Patient denied history of trauma. Spoke with Dr. Barry Dienes of general surgery who recommends outpatient follow-up CT abdominal/pelvis with contrast  in 6-8 weeks.  Assessment: Pain is likely referred from potential hematoma of spleen that was discovered on CT imaging. Cardiac etiology unlikely as pain presentation is atypical, with EKG without signs of ischemia, and troponin levels remaining stagnant. Plan: - Pain management: Hydrocodone-acetaminophen 5-325 mg PO, PRN q6h. - Will need outpatient follow-up CT  abd/pelvis with contrast in 6-8 weeks. - Goal towards discharge.  # Hypokalemia: # Hypocalcemia: # Hypomagnesemia: # Diarrhea: Potassium of 3.0 on admission and 2.7 this morning. Calcium 5.9 (7.0 corrected calcium with hypoalbuminemia). Ionized calcium order, awaiting lab results. 2g calcium gluconate x2 (4g total) given. Magnesium 1.0 on admission. Given 4g magnesium sulfate x2 (8g total). Magnesium this morning is 2.2. Hypokalemia may be 2/2 to chlorthalidone side-effect vs. GI losses vs. Hypomagnesemia. Plan: - Repeat BMP to monitor electrolyte abnormalities. - Not tolerating PO potassium. Try potassium solution. - Goal towards discharge if electrolytes normalize.  # Hypertension: Patient has history of hypertension, on Losartan and Chlorthalidone outpatient. Patient hypertensive this morning. - Restart home Losartan. - Holding Chlorthalidone in the setting of hypokalemia.  # [id number]: Takes trulicity and metformin at home. Last HgbA1C of 5.4 on 10/14/19 - SSI   Diet: Carb modified DVT Ppx: SCDs, no pharmacologic VTE ppx at this time because of possible splenic hematoma CODE STATUS: FULL CODE    LOS: 0 days   Nanetta Batty, Medical Student 10/29/2019, 10:32 AM

## 2019-10-30 LAB — BASIC METABOLIC PANEL
Anion gap: 11 (ref 5–15)
BUN: <5 mg/dL — ABNORMAL LOW (ref 8–23)
CO2: 26 mmol/L (ref 22–32)
Calcium: 7.1 mg/dL — ABNORMAL LOW (ref 8.9–10.3)
Chloride: 102 mmol/L (ref 98–111)
Creatinine, Ser: 0.82 mg/dL (ref 0.44–1.00)
GFR calc Af Amer: >60 mL/min (ref >60)
GFR calc non Af Amer: >60 mL/min (ref >60)
Glucose, Bld: 135 mg/dL — ABNORMAL HIGH (ref 70–99)
Potassium: 3.5 mmol/L (ref 3.5–5.1)
Sodium: 139 mmol/L (ref 135–145)

## 2019-10-30 LAB — GLUCOSE, CAPILLARY
Glucose-Capillary: 97 mg/dL (ref 70–99)
Glucose-Capillary: 172 mg/dL — ABNORMAL HIGH (ref 70–99)

## 2019-10-30 LAB — CALCIUM, IONIZED: Calcium, Ionized, Serum: 3.4 mg/dL — ABNORMAL LOW (ref 4.5–5.6)

## 2019-10-30 MED ORDER — AMLODIPINE BESYLATE 10 MG PO TABS: 10.0000 mg | ORAL_TABLET | Freq: Every day | ORAL | 0 refills | Status: DC

## 2019-10-30 MED ORDER — HYDROCODONE-ACETAMINOPHEN 5-325 MG PO TABS: 1.0000 | ORAL_TABLET | ORAL | 0 refills | Status: AC | PRN

## 2019-10-30 MED FILL — AMLODIPINE BESYLATE 10 MG T: 10 | 30 days supply | Qty: 30 | Fill #0

## 2019-10-30 MED FILL — HYDROCODON-APAP 5-325: 5-325 | 4 days supply | Qty: 20 | Fill #0

## 2019-10-30 NOTE — Progress Notes (Signed)
Brief History:  [name] is a 69 year old female with a past medical history significant for diabetes mellitus, hypertension, and alcohol use disorder who presented with left-sided chest and abdominal pain.  Subjective:  [name] was evaluated and examined this morning at bedside. Patient reports that she is feeling "great" this morning. She denies any pain. She reports that her left eye is feeling better and is no longer itching or sore.  We discussed that we are adding amlodipine to her hypertensive medications and that she has an appointment with the outpatient clinic on Monday, June 7th to follow-up for medication optimization.  Discussed with the patient that she should not drink alcohol with the pain medications that she will be discharged home with. She says that she was really "scared" by her health deteriorating so quickly and her having to come to the hospital. She said she is done drinking alcohol.   Objective:  Vital signs in last 24 hours: Vitals:   10/29/19 2351 10/30/19 0300 10/30/19 0340 10/30/19 0351  BP: (!) 179/96  (!) 167/95   Pulse: (!) 102  89   Resp: 16  14   Temp: 98.9 F (37.2 C)   98.5 F (36.9 C)  TempSrc: Oral   Oral  SpO2: 92% 94% (!) 86%   Weight:   75.7 kg   Height:       Weight change: -1.195 kg  Intake/Output Summary (Last 24 hours) at 10/30/2019 0740 Last data filed at 10/30/2019 1610 Gross per 24 hour  Intake --  Output 1600 ml  Net -1600 ml   Physical Exam  Constitutional: She is oriented to person, place, and time. No distress.  HENT:  Head: Normocephalic and atraumatic.  Eyes: EOM are normal.  Cardiovascular: Normal rate, regular rhythm and normal heart sounds. Exam reveals no gallop and no friction rub.  No murmur heard. Pulmonary/Chest: Effort normal and breath sounds normal. No respiratory distress. She has no wheezes. She has no rales.  Abdominal: Soft. Bowel sounds are normal. There is no abdominal tenderness. There is  no rebound and no guarding.  Neurological: She is alert and oriented to person, place, and time.  Skin: Skin is warm and dry. No rash noted. She is not diaphoretic. No erythema.  No bruising or discoloration of abdomen.  Psychiatric: Mood and affect normal.     Assessment/Plan:  Principal Problem:   Spleen injury Active Problems:   Essential hypertension, benign   Type 2 diabetes mellitus with diabetic neuropathy (HCC)   Hypokalemia   Chest pain   Hypocalcemia   LUQ pain  [name] is a 69 year old female with a past medical history significant for diabetes mellitus, hypertension, and alcohol use disorder who presented with left-sided chest and abdominal pain. CT imaging revealed a 5.8 x 2.0 cm complex fluid collection along the medial spleen, indicating a subacute or old subcapsular hematoma.  # Chest pain: Patient no longer experiencing pain. CT indicated subacute/old subcapsular hematoma. Patient denied history of trauma.  Assessment: Pain on initial presentation is likely referred from potential hematoma of spleen that was discovered on CT imaging. Cardiac etiology unlikely as pain presentation is atypical, with EKG without signs of ischemia, and troponin levels remaining stagnant. Plan: - Discharge home today. - Pain management: Hydrocodone-acetaminophen 5-325 mg PO, PRN q6h. - Will need outpatient follow-up CT abd/pelvis with contrast in 6-8 weeks.   # Hypokalemia: # Hypocalcemia: # Hypomagnesemia: # Diarrhea: Potassium 2.7 yesterday morning, it is 3.5 today.  Magnesium normalized with supplemental magnesium on admission. Hypokalemia likely secondary to chlorthalidone side-effect vs. GI losses vs. Hypomagnesemia. Plan: - Discharge home today with improvements in electrolyte levels. - Scheduled follow-up appointment for Monday in the clinic.  # Hypertension: Patient has history of hypertension, on Losartan and Chlorthalidone outpatient. Discontinuing home  Chlorthalidone. Was given amlodipine 5 mg once yesterday and labetalol 5 mg once yesterday as BP was consistently 180/100. Patient hypertensive this morning. - Continue home Losartan. - Discontinuing home Chlorthalidone as it is likely etiology of electrolyte abnormalities. - Amlodipine 10 mg daily  # Left eye Conjunctivitis Unilateral left eye redness that the patient reported as itchy and sore yesterday afternoon. Patient was given ketotifen eye drops. Patient reports significant relief of symptoms this morning. This is likely self-limited. - Continue Ketotifen.   # Q4XQ: Takes trulicity and metformin at home. Last HgbA1C of 5.4 on 10/14/19 - SSI   Diet: Carb modified DVT Ppx: SCDs, no pharmacologic VTE ppx at this time because of possible splenic hematoma CODE STATUS: FULL CODE    LOS: 1 day   [name], Medical Student 10/30/2019, 7:40 AM

## 2019-10-30 NOTE — Evaluation (Signed)
Physical Therapy Evaluation Patient Details Name: [name] MRN: [id number] DOB: September 02, 1950 Today's Date: 10/30/2019   History of Present Illness  69 year old female with a past medical history significant for diabetes mellitus, hypertension, and alcohol use disorder who presented to ED with left-sided chest and abdominal pain. CT revealed suspected splenic hematoma. Admitted 10/28/19 for treatment of chest pain likely referred from spleen and hypertension.   Clinical Impression  Patient evaluated by Physical Therapy with no further acute PT needs identified. Pt is mod I for transfers and ambulation due to L AKE prosthetic which she is able to don independently. All education has been completed and the patient has no further questions. Pt is at baseline level of function and has no additionally follow-up Physical Therapy or equipment needs. PT is signing off. Thank you for this referral.     Follow Up Recommendations No PT follow up    Equipment Recommendations  None recommended by PT(has all DME needed)       Precautions / Restrictions Precautions Precautions: None Restrictions Weight Bearing Restrictions: No      Mobility  Bed Mobility Overal bed mobility: Independent                Transfers Overall transfer level: Modified independent Equipment used: Rolling walker (2 wheeled)             General transfer comment: use of RW for support with seating of L AKA prosthetic  Ambulation/Gait Ambulation/Gait assistance: Modified independent (Device/Increase time) Gait Distance (Feet): 300 Feet Assistive device: Rolling walker (2 wheeled) Gait Pattern/deviations: Step-through pattern;Trunk flexed;Wide base of support Gait velocity: slowed Gait velocity interpretation: >2.62 ft/sec, indicative of community ambulatory General Gait Details: mod I for use of RW, slow steady gait      Balance Overall balance assessment: Modified Independent                                           Pertinent Vitals/Pain Pain Assessment: No/denies pain    Home Living Family/patient expects to be discharged to:: Private residence Living Arrangements: Alone Available Help at Discharge: Personal care attendant(7 days a week ) Type of Home: Other(Comment)(townhome) Home Access: Level entry     Home Layout: One level Home Equipment: Walker - 4 wheels;Cane - single point;Crutches;Wheelchair - Education officer, community - power;Shower seat;Bedside commode;Grab bars - tub/shower;Hand held shower head      Prior Function Level of Independence: Independent                  Extremity/Trunk Assessment   Upper Extremity Assessment Upper Extremity Assessment: Overall WFL for tasks assessed    Lower Extremity Assessment Lower Extremity Assessment: LLE deficits/detail;Overall WFL for tasks assessed LLE Deficits / Details: L AKA with prosthetic LLE Coordination: decreased fine motor       Communication   Communication: No difficulties  Cognition Arousal/Alertness: Awake/alert Behavior During Therapy: WFL for tasks assessed/performed Overall Cognitive Status: Within Functional Limits for tasks assessed                                        General Comments General comments (skin integrity, edema, etc.): elevated BP throughout, at rest 159/108, after ambulation 180/109, with 3 minutes rest 173/106, max HR with ambulation 128 bpm  Assessment/Plan    PT Assessment Patent does not need any further PT services         PT Goals (Current goals can be found in the Care Plan section)  Acute Rehab PT Goals Patient Stated Goal: go home today PT Goal Formulation: With patient     AM-PAC PT "6 Clicks" Mobility  Outcome Measure Help needed turning from your back to your side while in a flat bed without using bedrails?: None Help needed moving from lying on your back to sitting on the side of a flat bed without using  bedrails?: None Help needed moving to and from a bed to a chair (including a wheelchair)?: None Help needed standing up from a chair using your arms (e.g., wheelchair or bedside chair)?: None Help needed to walk in hospital room?: None Help needed climbing 3-5 steps with a railing? : A Little 6 Click Score: 23    End of Session   Activity Tolerance: Patient tolerated treatment well Patient left: in chair;with nursing/sitter in room;with call bell/phone within reach Nurse Communication: Mobility status;Other (comment)(elevated BP) PT Visit Diagnosis: Other abnormalities of gait and mobility (R26.89)    Time: 8099-8338 PT Time Calculation (min) (ACUTE ONLY): 48 min   Charges:   PT Evaluation $PT Eval Moderate Complexity: 1 Mod PT Treatments $Gait Training: 8-22 mins $Self Care/Home Management: 8-22        [name] B. Migdalia Dk PT, DPT Acute Rehabilitation Services Pager [phone] Office [phone]   [facility] 10/30/2019, 11:52 AM

## 2019-10-30 NOTE — Plan of Care (Signed)
  Problem: Education: Goal: Knowledge of General Education information will improve Description: Including pain rating scale, medication(s)/side effects and non-pharmacologic comfort measures Outcome: Progressing   Problem: Nutrition: Goal: Adequate nutrition will be maintained Outcome: Progressing   Problem: Coping: Goal: Level of anxiety will decrease Outcome: Progressing   Problem: Elimination: Goal: Will not experience complications related to bowel motility Outcome: Progressing   Problem: Pain Managment: Goal: General experience of comfort will improve Outcome: Progressing   Problem: Safety: Goal: Ability to remain free from injury will improve Outcome: Progressing   

## 2019-10-30 NOTE — Telephone Encounter (Signed)
Great. I will sign it.

## 2019-11-02 ENCOUNTER — Ambulatory Visit (INDEPENDENT_AMBULATORY_CARE_PROVIDER_SITE_OTHER): Payer: Medicare Other | Admitting: Internal Medicine

## 2019-11-02 ENCOUNTER — Other Ambulatory Visit: Payer: Self-pay | Admitting: Internal Medicine

## 2019-11-02 ENCOUNTER — Telehealth: Payer: Self-pay

## 2019-11-02 VITALS — BP 154/93 | HR 112 | Temp 98.4°F | Wt 174.2 lb

## 2019-11-02 DIAGNOSIS — E876 Hypokalemia: Secondary | ICD-10-CM | POA: Diagnosis not present

## 2019-11-02 DIAGNOSIS — G479 Sleep disorder, unspecified: Secondary | ICD-10-CM

## 2019-11-02 DIAGNOSIS — D735 Infarction of spleen: Secondary | ICD-10-CM

## 2019-11-02 DIAGNOSIS — I10 Essential (primary) hypertension: Secondary | ICD-10-CM | POA: Diagnosis not present

## 2019-11-02 DIAGNOSIS — Z7984 Long term (current) use of oral hypoglycemic drugs: Secondary | ICD-10-CM

## 2019-11-02 DIAGNOSIS — T148XXA Other injury of unspecified body region, initial encounter: Secondary | ICD-10-CM

## 2019-11-02 DIAGNOSIS — R933 Abnormal findings on diagnostic imaging of other parts of digestive tract: Secondary | ICD-10-CM

## 2019-11-02 DIAGNOSIS — E119 Type 2 diabetes mellitus without complications: Secondary | ICD-10-CM

## 2019-11-02 DIAGNOSIS — Z794 Long term (current) use of insulin: Secondary | ICD-10-CM

## 2019-11-02 DIAGNOSIS — S36029A Unspecified contusion of spleen, initial encounter: Secondary | ICD-10-CM | POA: Insufficient documentation

## 2019-11-02 DIAGNOSIS — E1140 Type 2 diabetes mellitus with diabetic neuropathy, unspecified: Secondary | ICD-10-CM

## 2019-11-02 MED ORDER — TRAZODONE HCL 50 MG PO TABS: 50.0000 mg | ORAL_TABLET | Freq: Every evening | ORAL | 0 refills | Status: DC | PRN

## 2019-11-02 MED ORDER — TRULICITY 0.75 MG/0.5ML ~~LOC~~ SOAJ: 0.7500 mg | SUBCUTANEOUS | 6 refills | Status: DC

## 2019-11-02 MED ORDER — AMLODIPINE BESYLATE 10 MG PO TABS: 10.0000 mg | ORAL_TABLET | Freq: Every day | ORAL | 3 refills | Status: DC

## 2019-11-02 NOTE — Progress Notes (Signed)
Internal Medicine Clinic Attending  Case discussed with Dr. Krienke at the time of the visit.  We reviewed the resident's history and exam and pertinent patient test results.  I agree with the assessment, diagnosis, and plan of care documented in the resident's note.    

## 2019-11-02 NOTE — Assessment & Plan Note (Signed)
Patient continues to do well on her Metformin 1000 mg twice daily and Trulicity 4.85 mg weekly.  She reports that her CBGs have been around 115-120s.  Denies any symptoms of hypoglycemia. -Continue Metformin 1000 mg twice daily -Refill Trulicity 9.27 mg weekly

## 2019-11-02 NOTE — Telephone Encounter (Signed)
Received TC from patient, states she was seen in [facility] today by Dr. Sherry Ruffing and Rx's were sent to [facility] for Amlodipine and Trulicity.  States she went to pick up the RX's and pharmacy would not give them to her.  Patient very upset and states she does not understand why she was not able to pick her RX's up.  States she didn't understand what pharmacy staff told her.  Requesting [place] to find out why she can't have her RX's and to call her back after 1pm today.  TC to [facility], spoke with Jinny Blossom, states too early for refill. Amlodipine - Pt given a 30 day supply on 6/4 Trulicity - Pt given an 84 day supply in May.  Will need to call patient back after 1pm today. SChaplin, RN,BSN

## 2019-11-02 NOTE — Patient Instructions (Signed)
Ms. DAVY FAUGHT,  It was a pleasure to see you today. Thank you for coming in.   Today we discussed your chest pain. I am glad that you are feeling better. I have ordered a repeat imaging test in 6 weeks to recheck your splenic hematoma.    We also discussed your hypertension and electrolyte abnormalities. Please continue taking the losartan and the amlodipine. I am checking some labs and will contact you if they are abnormal. Please RTC in 1 month to repeat your blood pressure. We may change your medications around at that time.    Please return to clinic in 1 month or sooner if needed.   Thank you again for coming in.   Asencion Noble.D.

## 2019-11-02 NOTE — Assessment & Plan Note (Signed)
°  Patient is currently on losartan 100 mg daily and amlodipine 10 mg daily, she denies any issues taking her medications.  She was recently started on this during her hospitalization, although chlorthalidone was discontinued due to concerns that this was contributing to her electrolyte abnormalities.  Blood pressure today is above goal at 154/93, however since she just recently started this new medication we will hold off on adjusting until we obtain repeat labs.  -Continue amlodipine 10 mg daily -Continue losartan 100 mg daily -Consider switching from losartan to olmesartan on next visit, could also consider addition of spironolactone -Check BMP

## 2019-11-02 NOTE — Progress Notes (Signed)
° °  CC: Hospital follow up  HPI:  [name] is a 69 y.o. with a history listed below presenting for follow-up of her recent hospitalization for chest pain, she was noted to have a splenic hematoma and multiple electrolyte abnormalities during admission.  Please see assessment and plan for further information.  Past Medical History:  Diagnosis Date   Allergic rhinitis    Anemia, iron deficiency 05/03/2011   2/2 GI AVMs - recieves iron infusions as well as periodic red blood cell transfusions   Anxiety    Arteriovenous malformation of duodenum 02/25/2015   CARPAL TUNNEL RELEASE, HX OF 07/22/2008   Colon polyps    Diabetes mellitus without complication ([place])    type 2   Difficulty sleeping    takes trazadone for sleep   Diverticulosis    GERD (gastroesophageal reflux disease)    Hepatitis C    in SVR with Harvoni 12/2014-02/2015.    History of hernia repair 08/18/2012   History of porphyria 11/30/2014   Porphyria cutanea tarda in setting of chronic Hepatitis C, frequent  IV iron infusions, and alcohol abuse    Hypertension    Peripheral vascular disease ([place])    Stroke ([place]) 2010   no residual deficits   Substance use disorder    cocaine   Tachycardia 07/10/2017   Trichomonas vaginitis 04/16/2019   Review of Systems: Reports intermittent chest pain, denies fevers, chills, nausea, vomiting, headaches, lightheadedness, dizziness, fatigue, weakness, or other symptoms.  Physical Exam:  Vitals:   11/02/19 0944  BP: (!) 154/93  Pulse: (!) 112  Temp: 98.4 F (36.9 C)  TempSrc: Oral  SpO2: 98%  Weight: 174 lb 3.2 oz (79 kg)   Physical Exam  Constitutional: She is oriented to person, place, and time and well-developed, well-nourished, and in no distress.  HENT:  Head: Normocephalic and atraumatic.  Eyes:  Left conjunctival injection  Cardiovascular: Regular rhythm and normal heart sounds.  Tachycardic  Pulmonary/Chest: Effort normal and breath sounds  normal. No respiratory distress.  Abdominal: Soft. Bowel sounds are normal. She exhibits no distension.  Musculoskeletal:        General: Normal range of motion.     Cervical back: Normal range of motion and neck supple.     Comments: Left AKA  Neurological: She is alert and oriented to person, place, and time.  Skin: Skin is warm and dry.  Psychiatric: Mood and affect normal.    Assessment & Plan:   See Encounters Tab for problem based charting.  Patient discussed with Dr. Heber [place]

## 2019-11-02 NOTE — Assessment & Plan Note (Signed)
Patient was in the hospital from 10/28/19-10/30/19 for chest pain and abdominal pain. Cardiac etiology was ruled out.  CT abdomen pelvis showed a splenic hematoma measuring 5.8 x 2.0 cm. Surgery was consulted during admission and recommended follow-up CT in 6 to 8 weeks to ensure stability of hematoma.  She was discharged with Norco 20 pills. She reports that she has been feeling fine since she left, she reports that she has had a few episodes of chest since she left however it's improveing She has taken the pain medications twice since Friday.  She denied any other symptoms such as fevers, chills, shortness of breath, headaches, lightheadedness, dizziness, fatigue, or other symptoms. Concerning for referred pain from the splenic hematoma. Vitals are stable, exam unremarkable today.   -CT abdomen pelvis in 6 weeks

## 2019-11-02 NOTE — Assessment & Plan Note (Signed)
Patient was noted to have multiple electrolyte abnormalities during admission including hypokalemia, hypomagnesemia, and hypocalcemia, this was thought to be due to chlorthalidone in GI losses from her diarrhea.  The electrolyte abnormalities had resolved and the chlorthalidone was discontinued on discharge.  She denies any specific complaints today.  Will repeat labs at this time.  -BMP -Mag

## 2019-11-02 NOTE — Assessment & Plan Note (Signed)
>>  ASSESSMENT AND PLAN FOR TYPE 2 DIABETES MELLITUS WITH DIABETIC NEUROPATHY ([facility]) WRITTEN ON 11/02/2019  1:03 PM BY ARMINDA DAVED HERO, MD  Patient continues to do well on her Metformin  1000 mg twice daily and Trulicity  0.75 mg weekly.  She reports that her CBGs have been around 115-120s.  Denies any symptoms of hypoglycemia. -Continue Metformin  1000 mg twice daily -Refill Trulicity  0.75 mg weekly

## 2019-11-03 ENCOUNTER — Ambulatory Visit: Payer: Medicare Other | Admitting: *Deleted

## 2019-11-03 DIAGNOSIS — I10 Essential (primary) hypertension: Secondary | ICD-10-CM

## 2019-11-03 DIAGNOSIS — Z794 Long term (current) use of insulin: Secondary | ICD-10-CM

## 2019-11-03 LAB — BMP8+ANION GAP
Anion Gap: 15 mmol/L (ref 10.0–18.0)
BUN/Creatinine Ratio: 13 (ref 12–28)
BUN: 15 mg/dL (ref 8–27)
CO2: 23 mmol/L (ref 20–29)
Calcium: 8.4 mg/dL — ABNORMAL LOW (ref 8.7–10.3)
Chloride: 102 mmol/L (ref 96–106)
Creatinine, Ser: 1.2 mg/dL — ABNORMAL HIGH (ref 0.57–1.00)
GFR calc Af Amer: 54 mL/min/{1.73_m2} — ABNORMAL LOW (ref >59)
GFR calc non Af Amer: 47 mL/min/{1.73_m2} — ABNORMAL LOW (ref >59)
Glucose: 89 mg/dL (ref 65–99)
Potassium: 3.9 mmol/L (ref 3.5–5.2)
Sodium: 140 mmol/L (ref 134–144)

## 2019-11-03 LAB — MAGNESIUM: Magnesium: 1.2 mg/dL — ABNORMAL LOW (ref 1.6–2.3)

## 2019-11-03 NOTE — Telephone Encounter (Signed)
Signed CMN and LMN for Left Socket Insert faxed to [facility] at [facility] at [phone].  Fax confirmation receipt received. Hubbard Hartshorn, BSN, RN-BC

## 2019-11-03 NOTE — Chronic Care Management (AMB) (Signed)
  Chronic Care Management   Note  11/03/2019 Name: [name] MRN: [id number] DOB: 23-Oct-1950   Successful outreach to patient regarding Emmi General Discharge Red Flag follow up.     Patient states she had her follow up with Dr. Manya Silvas at the [facility] on 11/02/19 and she is following the instructions given to her on the after visit summary. She is voicing concern that she was not able to pick up refills on amlodipine and Trulicity when she went to the [facility] after her appointment yesterday. She says she depends on [employer] for transportation so she has to plan her outings carefully and is worried she will not be able to get a refill on her Trulicity before it runs out after the next dose on 11/08/19. She is requesting that she be able to pick her next Trulicity refill at the [facility] after she has labs drawn at the [facility] on 11/11/19.  Follow up plan: Reviewed note written 6/7 by clinic RN Higinio Roger  and advised patient that she could not pick up her medication yesterday because it was too early to refill them.  With patient agreement,  will e-mail pharmacist at [facility] and request that arrangements be made to enable patient to pick up Trulicity refill up on 1/77 after she has labs drawn at 8:00 am at the [facility].  Kelli Churn RN, CCM, [facility] RN Care Manager [phone]

## 2019-11-05 ENCOUNTER — Ambulatory Visit: Payer: Self-pay | Admitting: *Deleted

## 2019-11-05 ENCOUNTER — Telehealth: Payer: Medicare Other

## 2019-11-05 DIAGNOSIS — I10 Essential (primary) hypertension: Secondary | ICD-10-CM

## 2019-11-05 DIAGNOSIS — Z794 Long term (current) use of insulin: Secondary | ICD-10-CM

## 2019-11-05 NOTE — Chronic Care Management (AMB) (Signed)
  Chronic Care Management   Note  11/05/2019 Name: [name] MRN: [id number] DOB: Apr 17, 1951  Unsuccessful attempt to reach patient in regards to another [place] Discharge Red Flag. See graphic below.     Follow up plan: No answer at home number and no option to leave message. Called mobile number and a HIPPA compliant phone message was left for the patient providing contact information and requesting a return call.  If no return call from patient will make another outreach in next 48 business hours. Patient left message for this CCM RN yesterday stating she did receive her Trulicity refill at the [facility] and voiced appreciation for this CCM RN's help.   Kelli Churn RN, CCM, [facility] RN Care Manager [phone]

## 2019-11-06 NOTE — Addendum Note (Signed)
Addended by: Asencion Noble on: 11/06/2019 11:55 AM   Modules accepted: Orders

## 2019-11-06 NOTE — Addendum Note (Signed)
Addended by: Asencion Noble on: 11/06/2019 11:59 AM   Modules accepted: Orders

## 2019-11-09 DIAGNOSIS — G933 Postviral fatigue syndrome: Secondary | ICD-10-CM | POA: Diagnosis not present

## 2019-11-10 ENCOUNTER — Ambulatory Visit: Payer: Medicare Other | Admitting: *Deleted

## 2019-11-10 ENCOUNTER — Other Ambulatory Visit: Payer: Self-pay

## 2019-11-10 ENCOUNTER — Ambulatory Visit (HOSPITAL_COMMUNITY): Payer: Medicare Other

## 2019-11-10 DIAGNOSIS — E1140 Type 2 diabetes mellitus with diabetic neuropathy, unspecified: Secondary | ICD-10-CM

## 2019-11-10 DIAGNOSIS — Z89612 Acquired absence of left leg above knee: Secondary | ICD-10-CM | POA: Diagnosis not present

## 2019-11-10 DIAGNOSIS — Z794 Long term (current) use of insulin: Secondary | ICD-10-CM

## 2019-11-10 DIAGNOSIS — D500 Iron deficiency anemia secondary to blood loss (chronic): Secondary | ICD-10-CM

## 2019-11-10 DIAGNOSIS — I10 Essential (primary) hypertension: Secondary | ICD-10-CM

## 2019-11-10 NOTE — Chronic Care Management (AMB) (Signed)
  Care Management   Note  11/10/2019 Name: NETANYA YAZDANI MRN: [id number] DOB: 04/12/1951  Successful outreach to patient to discuss Emmi Red Flag items. See graphic below.     No further follow up required: as patient states she has no other questions or problems regaring her hosptail discharge on 10/30/19.  Kelli Churn RN, CCM, [facility] RN Care Manager [phone]

## 2019-11-11 ENCOUNTER — Inpatient Hospital Stay: Payer: Medicare Other | Attending: Nurse Practitioner

## 2019-11-11 ENCOUNTER — Ambulatory Visit (HOSPITAL_COMMUNITY)
Admission: RE | Admit: 2019-11-11 | Discharge: 2019-11-11 | Disposition: A | Discharge status: Home / Self Care | Payer: Medicare Other | Source: Ambulatory Visit | Attending: Internal Medicine | Admitting: Internal Medicine

## 2019-11-11 ENCOUNTER — Encounter (HOSPITAL_COMMUNITY): Payer: Self-pay

## 2019-11-11 ENCOUNTER — Other Ambulatory Visit: Payer: Self-pay

## 2019-11-11 DIAGNOSIS — D735 Infarction of spleen: Secondary | ICD-10-CM | POA: Diagnosis not present

## 2019-11-11 DIAGNOSIS — D500 Iron deficiency anemia secondary to blood loss (chronic): Secondary | ICD-10-CM | POA: Diagnosis not present

## 2019-11-11 DIAGNOSIS — S36029A Unspecified contusion of spleen, initial encounter: Secondary | ICD-10-CM | POA: Diagnosis not present

## 2019-11-11 DIAGNOSIS — R933 Abnormal findings on diagnostic imaging of other parts of digestive tract: Secondary | ICD-10-CM | POA: Diagnosis not present

## 2019-11-11 LAB — CBC WITH DIFFERENTIAL (CANCER CENTER ONLY)
Abs Immature Granulocytes: 0.04 10*3/uL (ref 0.00–0.07)
Basophils Absolute: 0.1 10*3/uL (ref 0.0–0.1)
Basophils Relative: 1 %
Eosinophils Absolute: 0.1 10*3/uL (ref 0.0–0.5)
Eosinophils Relative: 1 %
HCT: 46.5 % — ABNORMAL HIGH (ref 36.0–46.0)
Hemoglobin: 15.2 g/dL — ABNORMAL HIGH (ref 12.0–15.0)
Immature Granulocytes: 1 %
Lymphocytes Relative: 16 %
Lymphs Abs: 1.2 10*3/uL (ref 0.7–4.0)
MCH: 29.7 pg (ref 26.0–34.0)
MCHC: 32.7 g/dL (ref 30.0–36.0)
MCV: 91 fL (ref 80.0–100.0)
Monocytes Absolute: 0.8 10*3/uL (ref 0.1–1.0)
Monocytes Relative: 10 %
Neutro Abs: 5.4 10*3/uL (ref 1.7–7.7)
Neutrophils Relative %: 71 %
Platelet Count: 325 10*3/uL (ref 150–400)
RBC: 5.11 MIL/uL (ref 3.87–5.11)
RDW: 15.9 % — ABNORMAL HIGH (ref 11.5–15.5)
WBC Count: 7.6 10*3/uL (ref 4.0–10.5)
nRBC: 0 % (ref 0.0–0.2)

## 2019-11-11 LAB — CMP (CANCER CENTER ONLY)
ALT: 10 U/L (ref 0–44)
AST: 18 U/L (ref 15–41)
Albumin: 3.3 g/dL — ABNORMAL LOW (ref 3.5–5.0)
Alkaline Phosphatase: 62 U/L (ref 38–126)
Anion gap: 14 (ref 5–15)
BUN: 12 mg/dL (ref 8–23)
CO2: 27 mmol/L (ref 22–32)
Calcium: 8.2 mg/dL — ABNORMAL LOW (ref 8.9–10.3)
Chloride: 102 mmol/L (ref 98–111)
Creatinine: 1.17 mg/dL — ABNORMAL HIGH (ref 0.44–1.00)
GFR, Est AFR Am: 56 mL/min — ABNORMAL LOW (ref >60)
GFR, Estimated: 49 mL/min — ABNORMAL LOW (ref >60)
Glucose, Bld: 171 mg/dL — ABNORMAL HIGH (ref 70–99)
Potassium: 3.4 mmol/L — ABNORMAL LOW (ref 3.5–5.1)
Sodium: 143 mmol/L (ref 135–145)
Total Bilirubin: 0.4 mg/dL (ref 0.3–1.2)
Total Protein: 7.1 g/dL (ref 6.5–8.1)

## 2019-11-11 LAB — IRON AND TIBC
Iron: 58 ug/dL (ref 41–142)
Saturation Ratios: 20 % — ABNORMAL LOW (ref 21–57)
TIBC: 291 ug/dL (ref 236–444)
UIBC: 233 ug/dL (ref 120–384)

## 2019-11-11 LAB — FERRITIN: Ferritin: 118 ng/mL (ref 11–307)

## 2019-11-13 ENCOUNTER — Other Ambulatory Visit: Payer: Medicare Other

## 2019-11-20 ENCOUNTER — Other Ambulatory Visit: Payer: Self-pay | Admitting: Internal Medicine

## 2019-11-20 DIAGNOSIS — Z1231 Encounter for screening mammogram for malignant neoplasm of breast: Secondary | ICD-10-CM

## 2019-12-07 DIAGNOSIS — G933 Postviral fatigue syndrome: Secondary | ICD-10-CM | POA: Diagnosis not present

## 2019-12-07 MED FILL — AMLODIPINE BESYLATE 10 MG T: 10 | 90 days supply | Qty: 90 | Fill #0

## 2019-12-08 ENCOUNTER — Other Ambulatory Visit: Payer: Self-pay | Admitting: Internal Medicine

## 2019-12-08 NOTE — Telephone Encounter (Signed)
Refill Request Pharmacy can not accept resident DEA number for the following medication.    Please call back.   [place], [place] [facility] [place]

## 2019-12-14 ENCOUNTER — Other Ambulatory Visit: Payer: Self-pay

## 2019-12-14 ENCOUNTER — Inpatient Hospital Stay: Payer: Medicare Other | Attending: Nurse Practitioner

## 2019-12-14 DIAGNOSIS — D500 Iron deficiency anemia secondary to blood loss (chronic): Secondary | ICD-10-CM

## 2019-12-14 DIAGNOSIS — Z79899 Other long term (current) drug therapy: Secondary | ICD-10-CM | POA: Insufficient documentation

## 2019-12-14 LAB — CBC WITH DIFFERENTIAL (CANCER CENTER ONLY)
Abs Immature Granulocytes: 0.07 K/uL (ref 0.00–0.07)
Basophils Absolute: 0.1 K/uL (ref 0.0–0.1)
Basophils Relative: 1 %
Eosinophils Absolute: 0.2 K/uL (ref 0.0–0.5)
Eosinophils Relative: 2 %
HCT: 37.6 % (ref 36.0–46.0)
Hemoglobin: 12.5 g/dL (ref 12.0–15.0)
Immature Granulocytes: 1 %
Lymphocytes Relative: 15 %
Lymphs Abs: 1.3 K/uL (ref 0.7–4.0)
MCH: 31.5 pg (ref 26.0–34.0)
MCHC: 33.2 g/dL (ref 30.0–36.0)
MCV: 94.7 fL (ref 80.0–100.0)
Monocytes Absolute: 0.8 K/uL (ref 0.1–1.0)
Monocytes Relative: 9 %
Neutro Abs: 6.3 K/uL (ref 1.7–7.7)
Neutrophils Relative %: 72 %
Platelet Count: 356 K/uL (ref 150–400)
RBC: 3.97 MIL/uL (ref 3.87–5.11)
RDW: 16.1 % — ABNORMAL HIGH (ref 11.5–15.5)
WBC Count: 8.7 K/uL (ref 4.0–10.5)
nRBC: 0 % (ref 0.0–0.2)

## 2019-12-14 LAB — CMP (CANCER CENTER ONLY)
ALT: 9 U/L (ref 0–44)
AST: 15 U/L (ref 15–41)
Albumin: 3.1 g/dL — ABNORMAL LOW (ref 3.5–5.0)
Alkaline Phosphatase: 70 U/L (ref 38–126)
Anion gap: 10 (ref 5–15)
BUN: 15 mg/dL (ref 8–23)
CO2: 28 mmol/L (ref 22–32)
Calcium: 8.6 mg/dL — ABNORMAL LOW (ref 8.9–10.3)
Chloride: 104 mmol/L (ref 98–111)
Creatinine: 1.21 mg/dL — ABNORMAL HIGH (ref 0.44–1.00)
GFR, Est AFR Am: 54 mL/min — ABNORMAL LOW
GFR, Estimated: 46 mL/min — ABNORMAL LOW
Glucose, Bld: 116 mg/dL — ABNORMAL HIGH (ref 70–99)
Potassium: 4.2 mmol/L (ref 3.5–5.1)
Sodium: 142 mmol/L (ref 135–145)
Total Bilirubin: 0.3 mg/dL (ref 0.3–1.2)
Total Protein: 6.6 g/dL (ref 6.5–8.1)

## 2019-12-14 LAB — IRON AND TIBC
Iron: 46 ug/dL (ref 41–142)
Saturation Ratios: 13 % — ABNORMAL LOW (ref 21–57)
TIBC: 359 ug/dL (ref 236–444)
UIBC: 313 ug/dL (ref 120–384)

## 2019-12-14 LAB — FERRITIN: Ferritin: 20 ng/mL (ref 11–307)

## 2019-12-15 ENCOUNTER — Telehealth: Payer: Self-pay

## 2019-12-15 ENCOUNTER — Other Ambulatory Visit: Payer: Self-pay | Admitting: Internal Medicine

## 2019-12-15 NOTE — Telephone Encounter (Signed)
Called and spoke with [name] and notified her of lab results and that Dr. Burr Medico wants her to have iv feraheme weekly x 2 in the next few weeks, Informed [name] that the scheduler will call and schedule the iv feraheme and [name] verbalized understanding.  Message sent to schedulers to contact [name].

## 2019-12-15 NOTE — Telephone Encounter (Signed)
-----   Message from Truitt Merle, MD sent at 12/15/2019 10:54 AM EDT ----- Please let pt know her lab results from yesterday. Her iron was low again, no anemia, please schedule iv feraheme weekly X2 in next few weeks, thanks   Truitt Merle

## 2019-12-16 DIAGNOSIS — R809 Proteinuria, unspecified: Secondary | ICD-10-CM | POA: Diagnosis not present

## 2019-12-16 DIAGNOSIS — E1122 Type 2 diabetes mellitus with diabetic chronic kidney disease: Secondary | ICD-10-CM | POA: Diagnosis not present

## 2019-12-16 DIAGNOSIS — N183 Chronic kidney disease, stage 3 unspecified: Secondary | ICD-10-CM | POA: Diagnosis not present

## 2019-12-16 DIAGNOSIS — R319 Hematuria, unspecified: Secondary | ICD-10-CM | POA: Diagnosis not present

## 2019-12-16 DIAGNOSIS — I129 Hypertensive chronic kidney disease with stage 1 through stage 4 chronic kidney disease, or unspecified chronic kidney disease: Secondary | ICD-10-CM | POA: Diagnosis not present

## 2019-12-16 DIAGNOSIS — D509 Iron deficiency anemia, unspecified: Secondary | ICD-10-CM | POA: Diagnosis not present

## 2019-12-16 MED FILL — FARXIGA 5 MG TABLET: 5 | 30 days supply | Qty: 30 | Fill #0

## 2019-12-17 ENCOUNTER — Telehealth: Payer: Self-pay | Admitting: Hematology

## 2019-12-17 NOTE — Telephone Encounter (Signed)
Scheduled appt per 7/20 sch msg - pt is aware of appts date and time

## 2019-12-21 ENCOUNTER — Ambulatory Visit: Payer: Medicare Other

## 2019-12-21 ENCOUNTER — Other Ambulatory Visit: Payer: Self-pay

## 2019-12-21 MED ORDER — ACCU-CHEK GUIDE VI STRP: ORAL_STRIP | 1 refills | Status: DC

## 2019-12-21 NOTE — Telephone Encounter (Signed)
ACCU-CHEK GUIDE test strip, Refill request @  [facility] - [address], [place] Phone:  [phone]  Fax:  [phone]

## 2019-12-21 NOTE — Telephone Encounter (Signed)
RX was sent via [facility] on 12/15/19. SChaplin, RN,BSN

## 2019-12-24 ENCOUNTER — Ambulatory Visit
Admission: RE | Admit: 2019-12-24 | Discharge: 2019-12-24 | Disposition: A | Discharge status: Home / Self Care | Payer: Medicare Other | Source: Ambulatory Visit | Attending: Internal Medicine | Admitting: Internal Medicine

## 2019-12-24 ENCOUNTER — Other Ambulatory Visit: Payer: Self-pay

## 2019-12-24 DIAGNOSIS — Z1231 Encounter for screening mammogram for malignant neoplasm of breast: Secondary | ICD-10-CM

## 2019-12-25 ENCOUNTER — Other Ambulatory Visit: Payer: Self-pay

## 2019-12-25 ENCOUNTER — Inpatient Hospital Stay: Payer: Medicare Other

## 2019-12-25 VITALS — BP 133/88 | HR 108 | Temp 98.4°F | Resp 17

## 2019-12-25 DIAGNOSIS — D500 Iron deficiency anemia secondary to blood loss (chronic): Secondary | ICD-10-CM

## 2019-12-25 DIAGNOSIS — Z79899 Other long term (current) drug therapy: Secondary | ICD-10-CM | POA: Diagnosis not present

## 2019-12-25 DIAGNOSIS — K922 Gastrointestinal hemorrhage, unspecified: Secondary | ICD-10-CM

## 2019-12-25 MED ORDER — DIPHENHYDRAMINE HCL 50 MG/ML IJ SOLN
50.0000 mg | Freq: Once | INTRAMUSCULAR | Status: AC
  Administered 2019-12-25: 50 mg via INTRAVENOUS

## 2019-12-25 MED ORDER — SODIUM CHLORIDE 0.9 % IV SOLN
Freq: Once | INTRAVENOUS | Status: AC
  Filled 2019-12-25: qty 250

## 2019-12-25 MED ORDER — SODIUM CHLORIDE 0.9 % IV SOLN
510.0000 mg | Freq: Once | INTRAVENOUS | Status: AC
  Administered 2019-12-25: 510 mg via INTRAVENOUS
  Filled 2019-12-25: qty 510

## 2019-12-25 MED ORDER — FAMOTIDINE 20 MG PO TABS
20.0000 mg | ORAL_TABLET | Freq: Once | ORAL | Status: AC
  Administered 2019-12-25: 20 mg via ORAL

## 2019-12-25 NOTE — Patient Instructions (Signed)

## 2019-12-27 ENCOUNTER — Telehealth: Payer: Self-pay

## 2019-12-27 NOTE — Telephone Encounter (Signed)
I called patient regarding appointment that she had scheduled with Dr Sharol Given on Monday. She got upset with me about having to move the appointment.  Patient states Merry Proud with [facility] was to be present for appointment as well. States she is unable to use prosthesis due to it falling apart. I offered appt with Bevely Palmer or Junie Panning but patient refused.  I rescheduled her with Dr Sharol Given for 08/09 at 10:15am Patient stated she uses public transportation and demanded for [place] to call [facility] letting them know we are the one cancelled her appointment so that she is not charged a $25 fee that she is unable to pay.  I called LMVM for Merry Proud to notify change of appointment. I also called [facility] [phone] was on hold for greater than 10 minutes. I also called Callie Fielding who is supervisor over transportation [phone] and left message for her advising we had changed her appointment and would need to make sure that the could transport patient for appointment that was rescheduled to 08/09 @ 10:15 and also asked for them not to charge patient the $25 fee. Provided my call back number as well as the office number should she need to call us back. Just sending to you so you are aware in case you get a message on patient.

## 2019-12-28 ENCOUNTER — Ambulatory Visit: Payer: Medicare Other | Admitting: Orthopedic Surgery

## 2020-01-01 ENCOUNTER — Telehealth: Payer: Self-pay

## 2020-01-01 ENCOUNTER — Ambulatory Visit (HOSPITAL_COMMUNITY)
Admission: RE | Admit: 2020-01-01 | Discharge: 2020-01-01 | Disposition: A | Discharge status: Home / Self Care | Payer: Medicare Other | Source: Ambulatory Visit | Attending: Hematology | Admitting: Hematology

## 2020-01-01 ENCOUNTER — Inpatient Hospital Stay (HOSPITAL_BASED_OUTPATIENT_CLINIC_OR_DEPARTMENT_OTHER): Payer: Medicare Other | Admitting: Hematology

## 2020-01-01 ENCOUNTER — Other Ambulatory Visit: Payer: Self-pay

## 2020-01-01 ENCOUNTER — Inpatient Hospital Stay: Payer: Medicare Other | Attending: Nurse Practitioner

## 2020-01-01 ENCOUNTER — Inpatient Hospital Stay: Payer: Medicare Other

## 2020-01-01 ENCOUNTER — Other Ambulatory Visit: Payer: Self-pay | Admitting: Hematology

## 2020-01-01 VITALS — BP 109/71 | HR 87 | Temp 98.6°F | Resp 16

## 2020-01-01 DIAGNOSIS — B182 Chronic viral hepatitis C: Secondary | ICD-10-CM | POA: Diagnosis not present

## 2020-01-01 DIAGNOSIS — M19022 Primary osteoarthritis, left elbow: Secondary | ICD-10-CM | POA: Diagnosis not present

## 2020-01-01 DIAGNOSIS — M25562 Pain in left knee: Secondary | ICD-10-CM | POA: Diagnosis not present

## 2020-01-01 DIAGNOSIS — Z993 Dependence on wheelchair: Secondary | ICD-10-CM | POA: Diagnosis not present

## 2020-01-01 DIAGNOSIS — M25552 Pain in left hip: Secondary | ICD-10-CM | POA: Diagnosis not present

## 2020-01-01 DIAGNOSIS — Z794 Long term (current) use of insulin: Secondary | ICD-10-CM | POA: Diagnosis not present

## 2020-01-01 DIAGNOSIS — D500 Iron deficiency anemia secondary to blood loss (chronic): Secondary | ICD-10-CM

## 2020-01-01 DIAGNOSIS — K219 Gastro-esophageal reflux disease without esophagitis: Secondary | ICD-10-CM | POA: Diagnosis not present

## 2020-01-01 DIAGNOSIS — Z89612 Acquired absence of left leg above knee: Secondary | ICD-10-CM | POA: Diagnosis not present

## 2020-01-01 DIAGNOSIS — Z86010 Personal history of colonic polyps: Secondary | ICD-10-CM | POA: Insufficient documentation

## 2020-01-01 DIAGNOSIS — I739 Peripheral vascular disease, unspecified: Secondary | ICD-10-CM | POA: Diagnosis not present

## 2020-01-01 DIAGNOSIS — E876 Hypokalemia: Secondary | ICD-10-CM | POA: Diagnosis not present

## 2020-01-01 DIAGNOSIS — K5520 Angiodysplasia of colon without hemorrhage: Secondary | ICD-10-CM | POA: Insufficient documentation

## 2020-01-01 DIAGNOSIS — Z79899 Other long term (current) drug therapy: Secondary | ICD-10-CM | POA: Diagnosis not present

## 2020-01-01 DIAGNOSIS — E119 Type 2 diabetes mellitus without complications: Secondary | ICD-10-CM | POA: Insufficient documentation

## 2020-01-01 DIAGNOSIS — M25512 Pain in left shoulder: Secondary | ICD-10-CM | POA: Diagnosis not present

## 2020-01-01 DIAGNOSIS — Z8673 Personal history of transient ischemic attack (TIA), and cerebral infarction without residual deficits: Secondary | ICD-10-CM | POA: Diagnosis not present

## 2020-01-01 DIAGNOSIS — W19XXXA Unspecified fall, initial encounter: Secondary | ICD-10-CM

## 2020-01-01 DIAGNOSIS — K922 Gastrointestinal hemorrhage, unspecified: Secondary | ICD-10-CM

## 2020-01-01 DIAGNOSIS — S59902A Unspecified injury of left elbow, initial encounter: Secondary | ICD-10-CM | POA: Diagnosis not present

## 2020-01-01 DIAGNOSIS — F419 Anxiety disorder, unspecified: Secondary | ICD-10-CM | POA: Insufficient documentation

## 2020-01-01 DIAGNOSIS — Z7982 Long term (current) use of aspirin: Secondary | ICD-10-CM | POA: Insufficient documentation

## 2020-01-01 DIAGNOSIS — F17210 Nicotine dependence, cigarettes, uncomplicated: Secondary | ICD-10-CM | POA: Insufficient documentation

## 2020-01-01 DIAGNOSIS — M79652 Pain in left thigh: Secondary | ICD-10-CM

## 2020-01-01 DIAGNOSIS — I10 Essential (primary) hypertension: Secondary | ICD-10-CM | POA: Diagnosis not present

## 2020-01-01 DIAGNOSIS — W050XXA Fall from non-moving wheelchair, initial encounter: Secondary | ICD-10-CM | POA: Diagnosis not present

## 2020-01-01 DIAGNOSIS — G4700 Insomnia, unspecified: Secondary | ICD-10-CM | POA: Insufficient documentation

## 2020-01-01 DIAGNOSIS — S79912A Unspecified injury of left hip, initial encounter: Secondary | ICD-10-CM | POA: Diagnosis not present

## 2020-01-01 LAB — CBC WITH DIFFERENTIAL (CANCER CENTER ONLY)
Abs Immature Granulocytes: 0.15 10*3/uL — ABNORMAL HIGH (ref 0.00–0.07)
Basophils Absolute: 0 10*3/uL (ref 0.0–0.1)
Basophils Relative: 1 %
Eosinophils Absolute: 0.1 10*3/uL (ref 0.0–0.5)
Eosinophils Relative: 1 %
HCT: 25.4 % — ABNORMAL LOW (ref 36.0–46.0)
Hemoglobin: 8.5 g/dL — ABNORMAL LOW (ref 12.0–15.0)
Immature Granulocytes: 2 %
Lymphocytes Relative: 12 %
Lymphs Abs: 1 10*3/uL (ref 0.7–4.0)
MCH: 32.1 pg (ref 26.0–34.0)
MCHC: 33.5 g/dL (ref 30.0–36.0)
MCV: 95.8 fL (ref 80.0–100.0)
Monocytes Absolute: 0.9 10*3/uL (ref 0.1–1.0)
Monocytes Relative: 10 %
Neutro Abs: 6.4 10*3/uL (ref 1.7–7.7)
Neutrophils Relative %: 74 %
Platelet Count: 298 10*3/uL (ref 150–400)
RBC: 2.65 MIL/uL — ABNORMAL LOW (ref 3.87–5.11)
RDW: 16.4 % — ABNORMAL HIGH (ref 11.5–15.5)
WBC Count: 8.6 10*3/uL (ref 4.0–10.5)
nRBC: 0 % (ref 0.0–0.2)

## 2020-01-01 MED ORDER — ACETAMINOPHEN 325 MG PO TABS: 650.0000 mg | ORAL_TABLET | Freq: Once | ORAL | Status: DC

## 2020-01-01 MED ORDER — FAMOTIDINE 20 MG PO TABS
ORAL_TABLET | ORAL | Status: AC
  Filled 2020-01-01: qty 1

## 2020-01-01 MED ORDER — ACETAMINOPHEN 325 MG PO TABS
650.0000 mg | ORAL_TABLET | Freq: Once | ORAL | Status: AC
  Administered 2020-01-01: 650 mg via ORAL

## 2020-01-01 MED ORDER — SODIUM CHLORIDE 0.9 % IV SOLN
INTRAVENOUS | Status: DC
  Filled 2020-01-01: qty 250

## 2020-01-01 MED ORDER — DIPHENHYDRAMINE HCL 50 MG/ML IJ SOLN
50.0000 mg | Freq: Once | INTRAMUSCULAR | Status: AC
  Administered 2020-01-01: 50 mg via INTRAVENOUS

## 2020-01-01 MED ORDER — OXYCODONE-ACETAMINOPHEN 5-325 MG PO TABS: 1.0000 | ORAL_TABLET | Freq: Four times a day (QID) | ORAL | 0 refills | Status: DC | PRN

## 2020-01-01 MED ORDER — SODIUM CHLORIDE 0.9 % IV SOLN
510.0000 mg | Freq: Once | INTRAVENOUS | Status: AC
  Administered 2020-01-01: 510 mg via INTRAVENOUS
  Filled 2020-01-01: qty 510

## 2020-01-01 MED ORDER — DIPHENHYDRAMINE HCL 50 MG/ML IJ SOLN
INTRAMUSCULAR | Status: AC
  Filled 2020-01-01: qty 1

## 2020-01-01 MED ORDER — ACETAMINOPHEN 325 MG PO TABS
ORAL_TABLET | ORAL | Status: AC
  Filled 2020-01-01: qty 2

## 2020-01-01 MED ORDER — FAMOTIDINE 20 MG PO TABS
20.0000 mg | ORAL_TABLET | Freq: Once | ORAL | Status: AC
  Administered 2020-01-01: 20 mg via ORAL

## 2020-01-01 MED FILL — OXYCODONE-APAP 5-325MG: 5-325 | 4 days supply | Qty: 15 | Fill #0

## 2020-01-01 NOTE — Patient Instructions (Signed)

## 2020-01-01 NOTE — Telephone Encounter (Signed)
I spoke with [name] in the lobby.  I let her know there were no fractures seen on her xrays.  I recommended she apply ice to the painful areas. She verbalized understanding.

## 2020-01-03 ENCOUNTER — Encounter: Payer: Self-pay | Admitting: Hematology

## 2020-01-03 DIAGNOSIS — W19XXXA Unspecified fall, initial encounter: Secondary | ICD-10-CM | POA: Insufficient documentation

## 2020-01-03 HISTORY — DX: Unspecified fall, initial encounter: W19.XXXA

## 2020-01-03 NOTE — Progress Notes (Signed)
[place]   [phone] [phone]) [phone]   Clinic Follow up Note   Patient Care Team: Jose Persia, MD as PCP - Evalina Field, MD as Consulting Physician (Ophthalmology) Coralie Keens, MD as Consulting Physician (General Surgery) Truitt Merle, MD as Consulting Physician (Hematology) Comer, Okey Regal, MD as Consulting Physician (Infectious Diseases) 01/03/2020  CHIEF COMPLAINT: fall at home    INTERVAL HISTORY: [name] came in today for IV iron.  She reported a recent fall at home 2 days ago and I saw her in the infusion room.  She was in the kitchen in her wheelchair, tried to pick up something on the floor, and fell out of wheelchair and landed on the floor on her face and chest.  She reported a significant pain at the left stump of amputation above the knee, and the pain on her shoulders.  She is able to move her stump but it is quite tender.  No open wound or significant bruising.  No loss of consciousness.  She did not go to urgent care or ED afterwards.  MEDICAL HISTORY:  Past Medical History:  Diagnosis Date  . Allergic rhinitis   . Anemia, iron deficiency 05/03/2011   2/2 GI AVMs - recieves iron infusions as well as periodic red blood cell transfusions  . Anxiety   . Arteriovenous malformation of duodenum 02/25/2015  . CARPAL TUNNEL RELEASE, HX OF 07/22/2008  . Colon polyps   . Diabetes mellitus without complication ([place])    type 2  . Difficulty sleeping    takes trazadone for sleep  . Diverticulosis   . GERD (gastroesophageal reflux disease)   . Hepatitis C    in SVR with Harvoni 12/2014-02/2015.   Marland Kitchen History of hernia repair 08/18/2012  . History of porphyria 11/30/2014   Porphyria cutanea tarda in setting of chronic Hepatitis C, frequent  IV iron infusions, and alcohol abuse   . Hypertension   . Peripheral vascular disease ([place])   . Stroke [facility]) 2010   no residual deficits  . Substance use disorder    cocaine  . Tachycardia  07/10/2017  . Trichomonas vaginitis 04/16/2019    SURGICAL HISTORY: Past Surgical History:  Procedure Laterality Date  . ABDOMINAL HYSTERECTOMY    . APPLICATION OF WOUND VAC N/A 06/21/2014   Procedure: APPLICATION OF WOUND VAC;  Surgeon: Coralie Keens, MD;  Location: [place];  Service: General;  Laterality: N/A;  . CARPAL TUNNEL RELEASE     rt hand  . COLONOSCOPY WITH PROPOFOL N/A 03/17/2018   Procedure: COLONOSCOPY WITH PROPOFOL;  Surgeon: Doran Stabler, MD;  [facility];  Service: Gastroenterology;  Laterality: N/A;  . ENTEROSCOPY N/A 12/04/2012   Procedure: ENTEROSCOPY;  Surgeon: Beryle Beams, MD;  [facility];  Service: Endoscopy;  Laterality: N/A;  . ENTEROSCOPY N/A 12/18/2012   Procedure: ENTEROSCOPY;  Surgeon: Beryle Beams, MD;  [facility];  Service: Endoscopy;  Laterality: N/A;  . ENTEROSCOPY N/A 02/25/2015   Procedure: ENTEROSCOPY;  Surgeon: Inda Castle, MD;  Location: [facility];  Service: Endoscopy;  Laterality: N/A;  . ENTEROSCOPY N/A 10/29/2017   Procedure: ENTEROSCOPY;  Surgeon: Doran Stabler, MD;  [facility];  Service: Gastroenterology;  Laterality: N/A;  . ESOPHAGOGASTRODUODENOSCOPY  05/05/2011   Procedure: ESOPHAGOGASTRODUODENOSCOPY (EGD);  Surgeon: Zenovia Jarred, MD;  Location[facility];  Service: Gastroenterology;  Laterality: N/A;  Dr. Hilarie Fredrickson will do procedure for Dr. Benson Norway Saturday.  . ESOPHAGOGASTRODUODENOSCOPY  05/08/2011  Procedure: ESOPHAGOGASTRODUODENOSCOPY (EGD);  Surgeon: Beryle Beams;  [facility];  Service: Endoscopy;  Laterality: N/A;  . ESOPHAGOGASTRODUODENOSCOPY  06/07/2011   Procedure: ESOPHAGOGASTRODUODENOSCOPY (EGD);  Surgeon: Beryle Beams, MD;  Location: [facility];  Service: Endoscopy;  Laterality: N/A;  . ESOPHAGOGASTRODUODENOSCOPY  12/20/2011   Procedure: ESOPHAGOGASTRODUODENOSCOPY (EGD);  Surgeon: Beryle Beams, MD;  Location: [facility];  Service: Endoscopy;   Laterality: N/A;  . EYE SURGERY Bilateral    cataracts  . FLEXIBLE SIGMOIDOSCOPY  12/21/2011   Procedure: FLEXIBLE SIGMOIDOSCOPY;  Surgeon: Beryle Beams, MD;  [facility];  Service: Endoscopy;  Laterality: N/A;  . HOT HEMOSTASIS  06/07/2011   Procedure: HOT HEMOSTASIS (ARGON PLASMA COAGULATION/BICAP);  Surgeon: Beryle Beams, MD;  Location: [facility];  Service: Endoscopy;  Laterality: N/A;  . INCISIONAL HERNIA REPAIR N/A 06/01/2014   Procedure: ATTEMPTED LAPAROSCOPIC AND OPEN INCISIONAL HERNIA REPAIR WITH MESH;  Surgeon: Coralie Keens, MD;  Location: [place];  Service: General;  Laterality: N/A;  . INSERTION OF MESH N/A 06/01/2014   Procedure: INSERTION OF MESH;  Surgeon: Coralie Keens, MD;  Location: [place];  Service: General;  Laterality: N/A;  . LAPAROTOMY  02/16/2012   Procedure: EXPLORATORY LAPAROTOMY;  Surgeon: Edward Jolly, MD;  [facility];  Service: General;  Laterality: N/A;  oversewing of anastomotic leak and rigid sigmoidoscopy  . LAPAROTOMY N/A 06/21/2014   Procedure: ABDOMINAL WOUND EXPLORATION;  Surgeon: Coralie Keens, MD;  Location: [place];  Service: General;  Laterality: N/A;  . LEG AMPUTATION ABOVE KNEE     left  . PARTIAL COLECTOMY  02/15/2012   Procedure: PARTIAL COLECTOMY;  Surgeon: Harl Bowie, MD;  [facility];  Service: General;  Laterality: N/A;  . TONSILLECTOMY      I have reviewed the social history and family history with the patient and they are unchanged from previous note.  ALLERGIES:  is allergic to ciprofloxacin, morphine and related, penicillins, codeine, lisinopril, and feraheme [ferumoxytol].  MEDICATIONS:  Current Outpatient Medications  Medication Sig Dispense Refill  . ACCU-CHEK FASTCLIX LANCETS MISC Use to test blood glucose 3 times daily. 100 each 11  . allopurinol (ZYLOPRIM) 100 MG tablet TAKE 1 TABLET (100 MG TOTAL) BY MOUTH DAILY. 30 tablet 5  . amLODipine (NORVASC) 10 MG tablet Take 1 tablet (10 mg total)  by mouth daily. 90 tablet 3  . aspirin EC 81 MG tablet Take 1 tablet (81 mg total) by mouth daily. 30 tablet 1  . Blood Glucose Monitoring Suppl (ACCU-CHEK GUIDE) w/Device KIT 1 each by Does not apply route 3 (three) times daily. The patient is insulin requiring, ICD 10 code E11.65. The patient tests 3 times a day. 1 kit 1  . cetirizine (ZYRTEC) 10 MG tablet TAKE 1 TABLET BY MOUTH DAILY (Patient taking differently: Take 10 mg by mouth daily. ) 90 tablet 1  . cholecalciferol (VITAMIN D) 1000 units tablet Take 1 tablet (1,000 Units total) by mouth daily. 100 tablet 2  . colchicine 0.6 MG tablet TAKE 1 TABLET (0.6 MG TOTAL) BY MOUTH DAILY. 30 tablet 1  . diphenhydramine-acetaminophen (TYLENOL PM) 25-500 MG TABS tablet Take 1 tablet by mouth at bedtime as needed (sleep).    . Dulaglutide (TRULICITY) 9.38 BO/1.7PZ SOPN Inject 0.5 mLs (0.75 mg total) into the skin once a week. 6 mL 6  . feeding supplement, GLUCERNA SHAKE, (GLUCERNA SHAKE) LIQD Take 237 mLs by mouth 3 (three) times daily between meals. (Patient taking differently: Take 237 mLs by  mouth 2 (two) times daily between meals. ) 90 Can 0  . gabapentin (NEURONTIN) 400 MG capsule Take 1 capsule in the morning, 1 in the afternoon and 2 before bedtime (Patient taking differently: Take 400-800 mg by mouth See admin instructions. Take 1 capsule (468m) in the morning, 1 capsule (4057m in the afternoon and 2  Capsules (80088mbefore bedtime) 120 capsule 5  . glucose blood (ACCU-CHEK GUIDE) test strip Use as to check blood sugar 3 times daily 300 strip 1  . Insulin Pen Needle (UNIFINE PENTIPS) 32G X 4 MM MISC Use to inject insulin once a day. The patient is insulin requiring, ICD 10 code E11.9. The patient injects 1 times per day. 100 each 2  . losartan (COZAAR) 100 MG tablet Take 1 tablet (100 mg total) by mouth daily. 90 tablet 3  . metFORMIN (GLUCOPHAGE) 1000 MG tablet TAKE 1 TABLET BY MOUTH TWICE A DAY WITH A MEAL (Patient taking differently: Take 1,000  mg by mouth 2 (two) times daily with a meal. TAKE 1 TABLET BY MOUTH TWICE A DAY WITH A MEAL) 180 tablet 1  . omeprazole (PRILOSEC) 20 MG capsule Take 1 capsule (20 mg total) by mouth daily. 90 capsule 1  . oxyCODONE-acetaminophen (PERCOCET/ROXICET) 5-325 MG tablet Take 1 tablet by mouth every 6 (six) hours as needed for severe pain. 15 tablet 0  . traZODone (DESYREL) 50 MG tablet Take 1 tablet (50 mg total) by mouth at bedtime as needed for sleep. 30 tablet 0  . vitamin C (ASCORBIC ACID) 500 MG tablet Take 500 mg by mouth every morning.     No current facility-administered medications for this visit.   Facility-Administered Medications Ordered in Other Visits  Medication Dose Route Frequency Provider Last Rate Last Admin  . acetaminophen (TYLENOL) tablet 650 mg  650 mg Oral Once FenTruitt MerleD        PHYSICAL EXAMINATION: ECOG PERFORMANCE STATUS: 3 - Symptomatic, >50% confined to bed Blood pressure 109/71, heart rate 87, respirate 16, temperature 37, pulse ox 99% on room air Patient laying in bed, no acute distress No warmth, ecchymosis or hematoma. No edema of arms or legs, significant tenderness in the left stump, and the left elbow.  LABORATORY DATA:  I have reviewed the data as listed CBC Latest Ref Rng & Units 01/01/2020 12/14/2019 11/11/2019  WBC 4.0 - 10.5 K/uL 8.6 8.7 7.6  Hemoglobin 12.0 - 15.0 g/dL 8.5(L) 12.5 15.2(H)  Hematocrit 36 - 46 % 25.4(L) 37.6 46.5(H)  Platelets 150 - 400 K/uL 298 356 325     CMP Latest Ref Rng & Units 12/14/2019 11/11/2019 11/02/2019  Glucose 70 - 99 mg/dL 116(H) 171(H) 89  BUN 8 - 23 mg/dL _0 Creatinine 0.44 - 1.00 mg/dL 1.21(H) 1.17(H) 1.20(H)  Sodium 135 - 145 mmol/L 142 143 140  Potassium 3.5 - 5.1 mmol/L 4.2 3.4(L) 3.9  Chloride 98 - 111 mmol/L 104 102 102  CO2 22 - 32 mmol/L _1 Calcium 8.9 - 10.3 mg/dL 8.6(L) 8.2(L) 8.4(L)  Total Protein 6.5 - 8.1 g/dL 6.6 7.1 -  Total Bilirubin 0.3 - 1.2 mg/dL 0.3 0.4 -  Alkaline Phos 38 - 126  U/L 70 62 -  AST 15 - 41 U/L 15 18 -  ALT 0 - 44 U/L 9 10 -      RADIOGRAPHIC STUDIES: I have personally reviewed the radiological images as listed and agreed with the findings in the report. No results found.  ASSESSMENT & PLAN:  69 yo female   1.  Recent mechanical fall, significant tenderness in the left elbow and left stump at amputation above knee  2. IDA secondary to GI blood loss, and anemia of chronic disease  Plan -will get x-ray of left hip to rule out fracture  -Tylenol as needed for muscular pain -she will proceed with iv iron as scheduled today -f/u as previously scheduled      Truitt Merle, MD 01/01/2020

## 2020-01-04 ENCOUNTER — Encounter: Payer: Self-pay | Admitting: Physician Assistant

## 2020-01-04 ENCOUNTER — Ambulatory Visit (INDEPENDENT_AMBULATORY_CARE_PROVIDER_SITE_OTHER): Payer: Medicare Other | Admitting: Physician Assistant

## 2020-01-04 ENCOUNTER — Ambulatory Visit: Payer: Medicare Other | Admitting: Orthopedic Surgery

## 2020-01-04 VITALS — Ht 62.0 in | Wt 174.2 lb

## 2020-01-04 DIAGNOSIS — Z89612 Acquired absence of left leg above knee: Secondary | ICD-10-CM

## 2020-01-04 DIAGNOSIS — S78112A Complete traumatic amputation at level between left hip and knee, initial encounter: Secondary | ICD-10-CM

## 2020-01-04 DIAGNOSIS — G933 Postviral fatigue syndrome: Secondary | ICD-10-CM | POA: Diagnosis not present

## 2020-01-04 MED FILL — TRULICITY 0.75 MG/0.5 ML PE: 0.75 | 84 days supply | Qty: 6 | Fill #1

## 2020-01-04 NOTE — Progress Notes (Signed)
Office Visit Note   Patient: [name]           Date of Birth: 1951-05-11           MRN: [id number] Visit Date: 01/04/2020              Requested by: Jose Persia, MD [address] [place],  [place] [address] PCP: Jose Persia, MD  Chief Complaint  Patient presents with  . Left Leg - Follow-up    Prosthetic Re-eval W/[facility]      HPI: This is a pleasant 69 year old woman who is status post left above-knee amputation.  She has lost 40 pounds and her prosthetic is no longer fitting properly.  She has had a significant volumetric decrease on the residual limb.  This has been significant enough that she recently fell and bruised the residual limb.  Assessment & Plan: Visit Diagnoses: No diagnosis found.  Plan: She has mild bruising on the residual limb but no open areas well-healed surgical incision.  Obvious that limb is not appropriate fitting.  Patient was provided a new prescription with [employer] to have a new above-knee suction socket fashioned.  Follow-Up Instructions: No follow-ups on file.   Ortho Exam  Patient is alert, oriented, no adenopathy, well-dressed, normal affect, normal respiratory effort. Mild bruising on the residual limb.  No cellulitis well-healed surgical incision no evidence of hematoma Patient is an existing left transfemoral amputee.  Patient's current comorbidities are not expected to impact the ability to function with the prescribed prosthesis. Patient verbally communicates a strong desire to use a prosthesis. Patient currently requires mobility aids to ambulate without a prosthesis.  Expects not to use mobility aids with a new prosthesis.  Patient is a K2 level ambulator that will use a prosthesis to walk around their home and the community over low level environmental barriers.     Imaging: No results found. No images are attached to the encounter.  Labs: Lab Results  Component Value Date   HGBA1C 5.4 10/14/2019    HGBA1C 5.1 07/15/2019   HGBA1C 5.3 04/14/2019   ESRSEDRATE 4 06/09/2010   ESRSEDRATE 6 05/08/2010   LABURIC 3.8 07/10/2017   LABURIC 10.7 (H) 12/26/2016   LABURIC 6.6 10/24/2016   REPTSTATUS 08/23/2016 FINAL 08/17/2016   GRAMSTAIN  06/17/2014    NO WBC SEEN NO SQUAMOUS EPITHELIAL CELLS SEEN NO ORGANISMS SEEN Performed at [facility]  08/17/2016    NO GROWTH 5 DAYS Performed at [facility], [place] [address], [place], [place] [address]    LABORGA KLEBSIELLA PNEUMONIAE 03/27/2012     Lab Results  Component Value Date   ALBUMIN 3.1 (L) 12/14/2019   ALBUMIN 3.3 (L) 11/11/2019   ALBUMIN 2.6 (L) 10/29/2019   PREALBUMIN 4.9 (L) 03/15/2008   LABURIC 3.8 07/10/2017   LABURIC 10.7 (H) 12/26/2016   LABURIC 6.6 10/24/2016    Lab Results  Component Value Date   MG 1.2 (L) 11/02/2019   MG 2.2 10/29/2019   MG 1.0 (L) 10/28/2019   Lab Results  Component Value Date   VD25OH 37.2 02/23/2015   VD25OH 10 (L) 11/30/2014    Lab Results  Component Value Date   PREALBUMIN 4.9 (L) 03/15/2008   CBC EXTENDED Latest Ref Rng & Units 01/01/2020 12/14/2019 11/11/2019  WBC 4.0 - 10.5 K/uL 8.6 8.7 7.6  RBC 3.87 - 5.11 MIL/uL 2.65(L) 3.97 5.11  HGB 12.0 - 15.0 g/dL 8.5(L) 12.5 15.2(H)  HCT  36 - 46 % 25.4(L) 37.6 46.5(H)  PLT 150 - 400 K/uL 298 356 325  NEUTROABS 1.7 - 7.7 K/uL 6.4 6.3 5.4  LYMPHSABS 0.7 - 4.0 K/uL 1.0 1.3 1.2     Body mass index is 31.86 kg/m.  Orders:  No orders of the defined types were placed in this encounter.  No orders of the defined types were placed in this encounter.    Procedures: No procedures performed  Clinical Data: No additional findings.  ROS:  All other systems negative, except as noted in the HPI. Review of Systems  Objective: Vital Signs: Ht 5\' 2"  (1.575 m)   Wt 174 lb 3.2 oz (79 kg)   BMI 31.86 kg/m   Specialty Comments:  No specialty comments available.  PMFS History: Patient Active Problem List    Diagnosis Date Noted  . Fall 01/03/2020  . Hematoma 11/02/2019  . Chest pain 10/28/2019  . Hypocalcemia 10/28/2019  . LUQ pain 10/28/2019  . Spleen injury 10/28/2019  . Microscopic hematuria 07/17/2019  . Sleep disorder 02/04/2019  . Kidney disease 02/04/2019  . Tinea corporis 06/26/2018  . Generalized anxiety disorder 07/24/2017  . Tobacco use disorder 07/24/2017  . Atherosclerosis of abdominal aorta ([place]) 10/25/2016  . Gout involving toe of right foot 02/28/2016  . Alcohol use 07/16/2015  . History of hepatitis C 03/15/2015  . Substance use disorder 03/05/2015  . Iron deficiency anemia due to chronic blood loss 02/23/2015  . Vitamin D deficiency 12/02/2014  . Preventative health care 11/30/2014  . Hypokalemia 09/27/2014  . Depression 07/10/2013  . Type 2 diabetes mellitus with diabetic neuropathy ([place]) 06/07/2013  . Above knee amputation of left lower extremity ([place]) 08/18/2012  . Angiodysplasia of duodenum 08/03/2011  . Essential hypertension, benign 07/22/2008  . GERD 07/22/2008   Past Medical History:  Diagnosis Date  . Allergic rhinitis   . Anemia, iron deficiency 05/03/2011   2/2 GI AVMs - recieves iron infusions as well as periodic red blood cell transfusions  . Anxiety   . Arteriovenous malformation of duodenum 02/25/2015  . CARPAL TUNNEL RELEASE, HX OF 07/22/2008  . Colon polyps   . Diabetes mellitus without complication ([place])    type 2  . Difficulty sleeping    takes trazadone for sleep  . Diverticulosis   . GERD (gastroesophageal reflux disease)   . Hepatitis C    in SVR with Harvoni 12/2014-02/2015.   Marland Kitchen History of hernia repair 08/18/2012  . History of porphyria 11/30/2014   Porphyria cutanea tarda in setting of chronic Hepatitis C, frequent  IV iron infusions, and alcohol abuse   . Hypertension   . Peripheral vascular disease ([place])   . Stroke [facility]) 2010   no residual deficits  . Substance use disorder    cocaine  . Tachycardia 07/10/2017  . Trichomonas  vaginitis 04/16/2019    Family History  Problem Relation Age of Onset  . Cancer Father        unknown  . Hypertension Mother   . Diverticulitis Mother     Past Surgical History:  Procedure Laterality Date  . ABDOMINAL HYSTERECTOMY    . APPLICATION OF WOUND VAC N/A 06/21/2014   Procedure: APPLICATION OF WOUND VAC;  Surgeon: Coralie Keens, MD;  Location: [place];  Service: General;  Laterality: N/A;  . CARPAL TUNNEL RELEASE     rt hand  . COLONOSCOPY WITH PROPOFOL N/A 03/17/2018   Procedure: COLONOSCOPY WITH PROPOFOL;  Surgeon: Doran Stabler, MD;  [facility];  Service: Gastroenterology;  Laterality: N/A;  . ENTEROSCOPY N/A 12/04/2012   Procedure: ENTEROSCOPY;  Surgeon: Beryle Beams, MD;  [facility];  Service: Endoscopy;  Laterality: N/A;  . ENTEROSCOPY N/A 12/18/2012   Procedure: ENTEROSCOPY;  Surgeon: Beryle Beams, MD;  [facility];  Service: Endoscopy;  Laterality: N/A;  . ENTEROSCOPY N/A 02/25/2015   Procedure: ENTEROSCOPY;  Surgeon: Inda Castle, MD;  Location: [facility];  Service: Endoscopy;  Laterality: N/A;  . ENTEROSCOPY N/A 10/29/2017   Procedure: ENTEROSCOPY;  Surgeon: Doran Stabler, MD;  [facility];  Service: Gastroenterology;  Laterality: N/A;  . ESOPHAGOGASTRODUODENOSCOPY  05/05/2011   Procedure: ESOPHAGOGASTRODUODENOSCOPY (EGD);  Surgeon: Zenovia Jarred, MD;  [facility];  Service: Gastroenterology;  Laterality: N/A;  Dr. Hilarie Fredrickson will do procedure for Dr. Benson Norway Saturday.  . ESOPHAGOGASTRODUODENOSCOPY  05/08/2011   Procedure: ESOPHAGOGASTRODUODENOSCOPY (EGD);  Surgeon: Beryle Beams;  [facility];  Service: Endoscopy;  Laterality: N/A;  . ESOPHAGOGASTRODUODENOSCOPY  06/07/2011   Procedure: ESOPHAGOGASTRODUODENOSCOPY (EGD);  Surgeon: Beryle Beams, MD;  [facility];  Service: Endoscopy;  Laterality: N/A;  . ESOPHAGOGASTRODUODENOSCOPY  12/20/2011   Procedure: ESOPHAGOGASTRODUODENOSCOPY  (EGD);  Surgeon: Beryle Beams, MD;  Location[facility];  Service: Endoscopy;  Laterality: N/A;  . EYE SURGERY Bilateral    cataracts  . FLEXIBLE SIGMOIDOSCOPY  12/21/2011   Procedure: FLEXIBLE SIGMOIDOSCOPY;  Surgeon: Beryle Beams, MD;  [facility];  Service: Endoscopy;  Laterality: N/A;  . HOT HEMOSTASIS  06/07/2011   Procedure: HOT HEMOSTASIS (ARGON PLASMA COAGULATION/BICAP);  Surgeon: Beryle Beams, MD;  Location[facility];  Service: Endoscopy;  Laterality: N/A;  . INCISIONAL HERNIA REPAIR N/A 06/01/2014   Procedure: ATTEMPTED LAPAROSCOPIC AND OPEN INCISIONAL HERNIA REPAIR WITH MESH;  Surgeon: Coralie Keens, MD;  Location: [place];  Service: General;  Laterality: N/A;  . INSERTION OF MESH N/A 06/01/2014   Procedure: INSERTION OF MESH;  Surgeon: Coralie Keens, MD;  Location: [place];  Service: General;  Laterality: N/A;  . LAPAROTOMY  02/16/2012   Procedure: EXPLORATORY LAPAROTOMY;  Surgeon: Edward Jolly, MD;  [facility];  Service: General;  Laterality: N/A;  oversewing of anastomotic leak and rigid sigmoidoscopy  . LAPAROTOMY N/A 06/21/2014   Procedure: ABDOMINAL WOUND EXPLORATION;  Surgeon: Coralie Keens, MD;  Location: [place];  Service: General;  Laterality: N/A;  . LEG AMPUTATION ABOVE KNEE     left  . PARTIAL COLECTOMY  02/15/2012   Procedure: PARTIAL COLECTOMY;  Surgeon: Harl Bowie, MD;  [facility];  Service: General;  Laterality: N/A;  . TONSILLECTOMY     Social History   Occupational History  . Occupation: Retired  Tobacco Use  . Smoking status: Current Some Day Smoker    Packs/day: 0.50    Years: 17.00    Pack years: 8.50    Types: Cigarettes  . Smokeless tobacco: Never Used  . Tobacco comment: 1 pack last 2 weeks.  Vaping Use  . Vaping Use: Never used  Substance and Sexual Activity  . Alcohol use: Yes    Alcohol/week: 0.0 standard drinks  . Drug use: Yes    Types: Cocaine, Marijuana    Comment: Sometimes.  . Sexual  activity: Yes    Partners: Male    Birth control/protection: Post-menopausal

## 2020-01-05 ENCOUNTER — Other Ambulatory Visit: Payer: Self-pay | Admitting: Hematology

## 2020-01-08 ENCOUNTER — Other Ambulatory Visit: Payer: Medicare Other

## 2020-01-08 ENCOUNTER — Ambulatory Visit: Payer: Medicare Other

## 2020-01-11 ENCOUNTER — Other Ambulatory Visit: Payer: Self-pay | Admitting: Internal Medicine

## 2020-01-12 ENCOUNTER — Other Ambulatory Visit: Payer: Self-pay | Admitting: *Deleted

## 2020-01-12 MED ORDER — ACCU-CHEK GUIDE VI STRP: ORAL_STRIP | 1 refills | Status: DC

## 2020-01-13 NOTE — Progress Notes (Signed)
[place]   [phone] [phone]   Clinic Follow up Note   Patient Care Team: Jose Persia, MD as PCP - Evalina Field, MD as Consulting Physician (Ophthalmology) Coralie Keens, MD as Consulting Physician (General Surgery) Truitt Merle, MD as Consulting Physician (Hematology) Comer, Okey Regal, MD as Consulting Physician (Infectious Diseases)  Date of Service:  01/14/2020  CHIEF COMPLAINT: F/u of Anemia   CURRENT THERAPY:  IV Feraheme as neededif ferritin <100  INTERVAL HISTORY:  [name] is here for a follow up of anemia. She presents to the clinic alone. She notes she still has left stump soreness and left shoulder soreness. She notes she did see her orthopedic surgeon and her prosthetic doctor. They are making another prosthetic leg once her pain improves. She is on Oxycodone q6hours. She notes this helps but not as effective at night to allow her to sleep. She notes she did have GI bleeding last month and was told by her GI to go to ED if this recurs.    REVIEW OF SYSTEMS:   Constitutional: Denies fevers, chills or abnormal weight loss Eyes: Denies blurriness of vision Ears, nose, mouth, throat, and face: Denies mucositis or sore throat Respiratory: Denies cough, dyspnea or wheezes Cardiovascular: Denies palpitation, chest discomfort or lower extremity swelling Gastrointestinal:  Denies nausea, heartburn or change in bowel habits Skin: Denies abnormal skin rashes MSK: (+) Improved left shoulder soreness (+) Stable left stump soreness Lymphatics: Denies new lymphadenopathy or easy bruising Neurological:Denies numbness, tingling or new weaknesses Behavioral/Psych: Mood is stable, no new changes  All other systems were reviewed with the patient and are negative.  MEDICAL HISTORY:  Past Medical History:  Diagnosis Date  . Allergic rhinitis   . Anemia, iron deficiency 05/03/2011   2/2 GI AVMs - recieves iron infusions  as well as periodic red blood cell transfusions  . Anxiety   . Arteriovenous malformation of duodenum 02/25/2015  . CARPAL TUNNEL RELEASE, HX OF 07/22/2008  . Colon polyps   . Diabetes mellitus without complication ([place])    type 2  . Difficulty sleeping    takes trazadone for sleep  . Diverticulosis   . GERD (gastroesophageal reflux disease)   . Hepatitis C    in SVR with Harvoni 12/2014-02/2015.   Marland Kitchen History of hernia repair 08/18/2012  . History of porphyria 11/30/2014   Porphyria cutanea tarda in setting of chronic Hepatitis C, frequent  IV iron infusions, and alcohol abuse   . Hypertension   . Peripheral vascular disease ([place])   . Stroke [facility]) 2010   no residual deficits  . Substance use disorder    cocaine  . Tachycardia 07/10/2017  . Trichomonas vaginitis 04/16/2019    SURGICAL HISTORY: Past Surgical History:  Procedure Laterality Date  . ABDOMINAL HYSTERECTOMY    . APPLICATION OF WOUND VAC N/A 06/21/2014   Procedure: APPLICATION OF WOUND VAC;  Surgeon: Coralie Keens, MD;  Location: [place];  Service: General;  Laterality: N/A;  . CARPAL TUNNEL RELEASE     rt hand  . COLONOSCOPY WITH PROPOFOL N/A 03/17/2018   Procedure: COLONOSCOPY WITH PROPOFOL;  Surgeon: Doran Stabler, MD;  [facility]: [facility];  Service: Gastroenterology;  Laterality: N/A;  . ENTEROSCOPY N/A 12/04/2012   Procedure: ENTEROSCOPY;  Surgeon: Beryle Beams, MD;  [facility];  Service: Endoscopy;  Laterality: N/A;  . ENTEROSCOPY N/A 12/18/2012   Procedure: ENTEROSCOPY;  Surgeon: Beryle Beams, MD;  [facility];  Service: Endoscopy;  Laterality: N/A;  . ENTEROSCOPY N/A 02/25/2015   Procedure: ENTEROSCOPY;  Surgeon: Inda Castle, MD;  Location: [facility];  Service: Endoscopy;  Laterality: N/A;  . ENTEROSCOPY N/A 10/29/2017   Procedure: ENTEROSCOPY;  Surgeon: Doran Stabler, MD;  [facility];  Service: Gastroenterology;  Laterality: N/A;  . ESOPHAGOGASTRODUODENOSCOPY   05/05/2011   Procedure: ESOPHAGOGASTRODUODENOSCOPY (EGD);  Surgeon: Zenovia Jarred, MD;  Location: [facility];  Service: Gastroenterology;  Laterality: N/A;  Dr. Hilarie Fredrickson will do procedure for Dr. Benson Norway Saturday.  . ESOPHAGOGASTRODUODENOSCOPY  05/08/2011   Procedure: ESOPHAGOGASTRODUODENOSCOPY (EGD);  Surgeon: Beryle Beams;  [facility];  Service: Endoscopy;  Laterality: N/A;  . ESOPHAGOGASTRODUODENOSCOPY  06/07/2011   Procedure: ESOPHAGOGASTRODUODENOSCOPY (EGD);  Surgeon: Beryle Beams, MD;  Location: [facility];  Service: Endoscopy;  Laterality: N/A;  . ESOPHAGOGASTRODUODENOSCOPY  12/20/2011   Procedure: ESOPHAGOGASTRODUODENOSCOPY (EGD);  Surgeon: Beryle Beams, MD;  Location: [facility];  Service: Endoscopy;  Laterality: N/A;  . EYE SURGERY Bilateral    cataracts  . FLEXIBLE SIGMOIDOSCOPY  12/21/2011   Procedure: FLEXIBLE SIGMOIDOSCOPY;  Surgeon: Beryle Beams, MD;  [facility];  Service: Endoscopy;  Laterality: N/A;  . HOT HEMOSTASIS  06/07/2011   Procedure: HOT HEMOSTASIS (ARGON PLASMA COAGULATION/BICAP);  Surgeon: Beryle Beams, MD;  Location: [facility];  Service: Endoscopy;  Laterality: N/A;  . INCISIONAL HERNIA REPAIR N/A 06/01/2014   Procedure: ATTEMPTED LAPAROSCOPIC AND OPEN INCISIONAL HERNIA REPAIR WITH MESH;  Surgeon: Coralie Keens, MD;  Location: [place];  Service: General;  Laterality: N/A;  . INSERTION OF MESH N/A 06/01/2014   Procedure: INSERTION OF MESH;  Surgeon: Coralie Keens, MD;  Location: [place];  Service: General;  Laterality: N/A;  . LAPAROTOMY  02/16/2012   Procedure: EXPLORATORY LAPAROTOMY;  Surgeon: Edward Jolly, MD;  [facility];  Service: General;  Laterality: N/A;  oversewing of anastomotic leak and rigid sigmoidoscopy  . LAPAROTOMY N/A 06/21/2014   Procedure: ABDOMINAL WOUND EXPLORATION;  Surgeon: Coralie Keens, MD;  Location: [place];  Service: General;  Laterality: N/A;  . LEG AMPUTATION ABOVE KNEE     left  . PARTIAL  COLECTOMY  02/15/2012   Procedure: PARTIAL COLECTOMY;  Surgeon: Harl Bowie, MD;  [facility];  Service: General;  Laterality: N/A;  . TONSILLECTOMY      I have reviewed the social history and family history with the patient and they are unchanged from previous note.  ALLERGIES:  is allergic to ciprofloxacin, morphine and related, penicillins, codeine, lisinopril, and feraheme [ferumoxytol].  MEDICATIONS:  Current Outpatient Medications  Medication Sig Dispense Refill  . ACCU-CHEK FASTCLIX LANCETS MISC Use to test blood glucose 3 times daily. 100 each 11  . allopurinol (ZYLOPRIM) 100 MG tablet TAKE 1 TABLET (100 MG TOTAL) BY MOUTH DAILY. 30 tablet 5  . amLODipine (NORVASC) 10 MG tablet Take 1 tablet (10 mg total) by mouth daily. 90 tablet 3  . aspirin EC 81 MG tablet Take 1 tablet (81 mg total) by mouth daily. 30 tablet 1  . Blood Glucose Monitoring Suppl (ACCU-CHEK GUIDE) w/Device KIT 1 each by Does not apply route 3 (three) times daily. The patient is insulin requiring, ICD 10 code E11.65. The patient tests 3 times a day. 1 kit 1  . cetirizine (ZYRTEC) 10 MG tablet TAKE 1 TABLET BY MOUTH DAILY (Patient taking differently: Take 10 mg by mouth daily. ) 90 tablet 1  . cholecalciferol (VITAMIN D) 1000 units tablet Take 1  tablet (1,000 Units total) by mouth daily. 100 tablet 2  . colchicine 0.6 MG tablet TAKE 1 TABLET (0.6 MG TOTAL) BY MOUTH DAILY. 30 tablet 1  . diphenhydramine-acetaminophen (TYLENOL PM) 25-500 MG TABS tablet Take 1 tablet by mouth at bedtime as needed (sleep).    . Dulaglutide (TRULICITY) 0.34 VQ/2.5ZD SOPN Inject 0.5 mLs (0.75 mg total) into the skin once a week. 6 mL 6  . feeding supplement, GLUCERNA SHAKE, (GLUCERNA SHAKE) LIQD Take 237 mLs by mouth 3 (three) times daily between meals. (Patient taking differently: Take 237 mLs by mouth 2 (two) times daily between meals. ) 90 Can 0  . gabapentin (NEURONTIN) 400 MG capsule Take 1 capsule in the morning, 1 in the  afternoon and 2 before bedtime (Patient taking differently: Take 400-800 mg by mouth See admin instructions. Take 1 capsule ($RemoveBe'400mg'VHbjmpDvn$ ) in the morning, 1 capsule ($RemoveBe'400mg'aPauZIyov$ ) in the afternoon and 2  Capsules ($RemoveBe'800mg'OyzdIcqea$ ) before bedtime) 120 capsule 5  . glucose blood (ACCU-CHEK GUIDE) test strip Use as to check blood sugar 3 times daily 300 strip 1  . Insulin Pen Needle (UNIFINE PENTIPS) 32G X 4 MM MISC Use to inject insulin once a day. The patient is insulin requiring, ICD 10 code E11.9. The patient injects 1 times per day. 100 each 2  . losartan (COZAAR) 100 MG tablet Take 1 tablet (100 mg total) by mouth daily. 90 tablet 3  . metFORMIN (GLUCOPHAGE) 1000 MG tablet TAKE 1 TABLET BY MOUTH TWICE A DAY WITH A MEAL (Patient taking differently: Take 1,000 mg by mouth 2 (two) times daily with a meal. TAKE 1 TABLET BY MOUTH TWICE A DAY WITH A MEAL) 180 tablet 1  . omeprazole (PRILOSEC) 20 MG capsule Take 1 capsule (20 mg total) by mouth daily. 90 capsule 1  . oxyCODONE-acetaminophen (PERCOCET/ROXICET) 5-325 MG tablet Take 1 tablet by mouth every 8 (eight) hours as needed for severe pain. 10 tablet 0  . traZODone (DESYREL) 50 MG tablet Take 1 tablet (50 mg total) by mouth at bedtime as needed for sleep. 30 tablet 0  . vitamin C (ASCORBIC ACID) 500 MG tablet Take 500 mg by mouth every morning.     No current facility-administered medications for this visit.   Facility-Administered Medications Ordered in Other Visits  Medication Dose Route Frequency Provider Last Rate Last Admin  . acetaminophen (TYLENOL) tablet 650 mg  650 mg Oral Once Truitt Merle, MD      . ferumoxytol [facility]) 510 mg in sodium chloride 0.9 % 100 mL IVPB  510 mg Intravenous Once Truitt Merle, MD        PHYSICAL EXAMINATION: ECOG PERFORMANCE STATUS: 3 - Symptomatic, >50% confined to bed  Vitals:   01/14/20 0933  BP: 138/83  Pulse: 77  Resp: 17  Temp: 97.9 F (36.6 C)  SpO2: 99%   Filed Weights    GENERAL:alert, no distress and  comfortable SKIN: skin color, texture, turgor are normal, no rashes or significant lesions Musculoskeletal:no cyanosis of digits and no clubbing, left AKA NEURO: alert & oriented x 3 with fluent speech, no focal motor/sensory deficits  LABORATORY DATA:  I have reviewed the data as listed CBC Latest Ref Rng & Units 01/14/2020 01/01/2020 12/14/2019  WBC 4.0 - 10.5 K/uL 6.9 8.6 8.7  Hemoglobin 12.0 - 15.0 g/dL 10.7(L) 8.5(L) 12.5  Hematocrit 36 - 46 % 32.7(L) 25.4(L) 37.6  Platelets 150 - 400 K/uL 350 298 356     CMP Latest Ref Rng & Units 01/14/2020 12/14/2019  11/11/2019  Glucose 70 - 99 mg/dL 120(H) 116(H) 171(H)  BUN 8 - 23 mg/dL _0 Creatinine 0.44 - 1.00 mg/dL 0.95 1.21(H) 1.17(H)  Sodium 135 - 145 mmol/L 139 142 143  Potassium 3.5 - 5.1 mmol/L 3.4(L) 4.2 3.4(L)  Chloride 98 - 111 mmol/L 107 104 102  CO2 22 - 32 mmol/L _1 Calcium 8.9 - 10.3 mg/dL 8.9 8.6(L) 8.2(L)  Total Protein 6.5 - 8.1 g/dL 6.6 6.6 7.1  Total Bilirubin 0.3 - 1.2 mg/dL <0.2(L) 0.3 0.4  Alkaline Phos 38 - 126 U/L 70 70 62  AST 15 - 41 U/L _2 ALT 0 - 44 U/L _3 RADIOGRAPHIC STUDIES: I have personally reviewed the radiological images as listed and agreed with the findings in the report. No results found.   ASSESSMENT & PLAN:  [name] is a 69 y.o. female with    1. Iron deficiency anemia secondary to GIbloodloss,andAnemia of Chronic Disease  -History of multiple GI bleeding in 2016, required blood transfusion, hospitalization and IV Feraheme -She previously required blood transfusion periodically, much less lately  -She has been receiving IV Feraheme every1-32monthon average lately. Shehad a moderate response to IV iron recently.Goal is to keep her Hg >10and ferritin>100 -She has been on oral iron for 5-6 years now and has had black watery stool.  -She had an EGD and colonoscopy on 10/29/2017 and 03/17/2018 with Dr. DLoletha Carrow which was normal.  -[facility] bowel. I discussed if she experience more GI bleeding, [facility] -Her last IV Feraheme was 01/01/20.  -She had 1 episode of black stool in early this month and developed moderate anemia again. Her last colonoscopy with Dr DLoletha Carrowwas in 2019 and normal. If her black or bloody stool recurs repeatedly she should f/u with Dr DLoletha Carrow  -Labs reviewed, Hg 10.7, K 3.4, BG 120, albumin 3.3. Iron panel still pending. Anemia improved with iv iron. Overall adequate to proceed with IV Feraheme today.  -Will monitor with labs every 2 weeks for next few month due to recent bleeding  -F/u in 6 weeks   2. Porphyria cutaneoustarda (PCT), resolvedafter Hep C Treatment  3.Chronic hepatitis C infection -She previously tested positive for hepatitis C in 2009. She has previously completed treatment for hepatitis C in 2016 and repeated test was negative.  -She had an EGD and colonoscopy on 10/29/2017 and 03/17/2018 with Dr. DLoletha Carrow which was normal. SDorothyann Gibbscontinue to follow up with him. -05/[place]. AFP is still pending for today (10/12/19)  4. DM2 -Management per PCP's office. -On Lantus, Metformin -Was hospitalized in March 2018 for high glucose levels greater than 1000.  -Continue to follow up closely with PCP  5. Left AKA, hasprosthesis, she is wheelchair bound most of time -She recently has been having right LE edema when her foot is down. This is intermittent. -I discussed the possibly etiology. I have low suspicion this is related to blood clots or heart issues. This could be related to nutritional deficiencies or decreased venous return.  -I discussed elevating her right leg. I will obtain doppler this week given she is not very mobile, she is agreeable  -On 12/30/19 she had a mechanical fall, resulting in significant tenderness in the left elbow and left stump at amputation above  knee. There were no fractures on her 01/01/20 Xray.  -For pain she has been on Oxycodone q6hours, I refilled today (01/14/20). Her left shoulder pain has improved but her left stump pain mostly stable. -She has been seen by her orthopedic surgeon and prosthetic doctor who is making her a new prosthetic leg to use once her pain improves.   6. Smoking cessation  -She is still actively smoking,she isnot ready to quit, however, she knows that she needs to quit  7. Insomnia  -She notes she has difficulty falling asleep and get about 3 hours of sleep at night.  -I recommend she try benadryl or Melatonin at night.  8. Hypokalemia  -Her K is 3.4 today (01/14/20)  -I encouraged her to increase potassium in her diet.    Plan -I refilled #10 Oxycodone today (01/14/20) for left leg pain after fall, will not refill more  -Proceed with IV Feraheme today  -Lab in 2, 4, 6 weeks, give IV Ferahemeif ferritin <100 (will give two doses if ferritin<30) -F/u in 6 weeks  -she will call us if she notices GI bleeding again    No problem-specific Assessment & Plan notes found for this encounter.   No orders of the defined types were placed in this encounter.  All questions were answered. The patient knows to call the clinic with any problems, questions or concerns. No barriers to learning was detected. The total time spent in the appointment was 30 minutes.     Truitt Merle, MD 01/14/2020   I, Joslyn Devon, am acting as scribe for Truitt Merle, MD.   I have reviewed the above documentation for accuracy and completeness, and I agree with the above.

## 2020-01-14 ENCOUNTER — Other Ambulatory Visit: Payer: Medicare Other

## 2020-01-14 ENCOUNTER — Encounter: Payer: Self-pay | Admitting: Hematology

## 2020-01-14 ENCOUNTER — Ambulatory Visit: Payer: Medicare Other | Admitting: Hematology

## 2020-01-14 ENCOUNTER — Inpatient Hospital Stay (HOSPITAL_BASED_OUTPATIENT_CLINIC_OR_DEPARTMENT_OTHER): Payer: Medicare Other | Admitting: Hematology

## 2020-01-14 ENCOUNTER — Other Ambulatory Visit: Payer: Self-pay

## 2020-01-14 ENCOUNTER — Inpatient Hospital Stay: Payer: Medicare Other

## 2020-01-14 VITALS — BP 138/83 | HR 77 | Temp 97.9°F | Resp 17 | Ht 62.0 in

## 2020-01-14 VITALS — BP 137/78 | HR 74 | Temp 98.0°F | Resp 17

## 2020-01-14 DIAGNOSIS — D500 Iron deficiency anemia secondary to blood loss (chronic): Secondary | ICD-10-CM

## 2020-01-14 DIAGNOSIS — I10 Essential (primary) hypertension: Secondary | ICD-10-CM | POA: Diagnosis not present

## 2020-01-14 DIAGNOSIS — E876 Hypokalemia: Secondary | ICD-10-CM | POA: Diagnosis not present

## 2020-01-14 DIAGNOSIS — M25512 Pain in left shoulder: Secondary | ICD-10-CM | POA: Diagnosis not present

## 2020-01-14 DIAGNOSIS — Z8619 Personal history of other infectious and parasitic diseases: Secondary | ICD-10-CM

## 2020-01-14 DIAGNOSIS — Z794 Long term (current) use of insulin: Secondary | ICD-10-CM | POA: Diagnosis not present

## 2020-01-14 DIAGNOSIS — Z7982 Long term (current) use of aspirin: Secondary | ICD-10-CM | POA: Diagnosis not present

## 2020-01-14 DIAGNOSIS — Z89612 Acquired absence of left leg above knee: Secondary | ICD-10-CM | POA: Diagnosis not present

## 2020-01-14 DIAGNOSIS — Z8673 Personal history of transient ischemic attack (TIA), and cerebral infarction without residual deficits: Secondary | ICD-10-CM | POA: Diagnosis not present

## 2020-01-14 DIAGNOSIS — M25562 Pain in left knee: Secondary | ICD-10-CM | POA: Diagnosis not present

## 2020-01-14 DIAGNOSIS — I739 Peripheral vascular disease, unspecified: Secondary | ICD-10-CM | POA: Diagnosis not present

## 2020-01-14 DIAGNOSIS — K219 Gastro-esophageal reflux disease without esophagitis: Secondary | ICD-10-CM | POA: Diagnosis not present

## 2020-01-14 DIAGNOSIS — K922 Gastrointestinal hemorrhage, unspecified: Secondary | ICD-10-CM

## 2020-01-14 DIAGNOSIS — G4700 Insomnia, unspecified: Secondary | ICD-10-CM | POA: Diagnosis not present

## 2020-01-14 DIAGNOSIS — Z993 Dependence on wheelchair: Secondary | ICD-10-CM | POA: Diagnosis not present

## 2020-01-14 DIAGNOSIS — Z79899 Other long term (current) drug therapy: Secondary | ICD-10-CM | POA: Diagnosis not present

## 2020-01-14 DIAGNOSIS — Z86010 Personal history of colonic polyps: Secondary | ICD-10-CM | POA: Diagnosis not present

## 2020-01-14 DIAGNOSIS — K5520 Angiodysplasia of colon without hemorrhage: Secondary | ICD-10-CM | POA: Diagnosis not present

## 2020-01-14 DIAGNOSIS — E119 Type 2 diabetes mellitus without complications: Secondary | ICD-10-CM | POA: Diagnosis not present

## 2020-01-14 LAB — CMP (CANCER CENTER ONLY)
ALT: 11 U/L (ref 0–44)
AST: 17 U/L (ref 15–41)
Albumin: 3.3 g/dL — ABNORMAL LOW (ref 3.5–5.0)
Alkaline Phosphatase: 70 U/L (ref 38–126)
Anion gap: 8 (ref 5–15)
BUN: 11 mg/dL (ref 8–23)
CO2: 24 mmol/L (ref 22–32)
Calcium: 8.9 mg/dL (ref 8.9–10.3)
Chloride: 107 mmol/L (ref 98–111)
Creatinine: 0.95 mg/dL (ref 0.44–1.00)
GFR, Est AFR Am: >60 mL/min (ref >60)
GFR, Estimated: >60 mL/min (ref >60)
Glucose, Bld: 120 mg/dL — ABNORMAL HIGH (ref 70–99)
Potassium: 3.4 mmol/L — ABNORMAL LOW (ref 3.5–5.1)
Sodium: 139 mmol/L (ref 135–145)
Total Bilirubin: <0.2 mg/dL — ABNORMAL LOW (ref 0.3–1.2)
Total Protein: 6.6 g/dL (ref 6.5–8.1)

## 2020-01-14 LAB — IRON AND TIBC
Iron: 38 ug/dL — ABNORMAL LOW (ref 41–142)
Saturation Ratios: 11 % — ABNORMAL LOW (ref 21–57)
TIBC: 341 ug/dL (ref 236–444)
UIBC: 303 ug/dL (ref 120–384)

## 2020-01-14 LAB — CBC WITH DIFFERENTIAL (CANCER CENTER ONLY)
Abs Immature Granulocytes: 0.05 10*3/uL (ref 0.00–0.07)
Basophils Absolute: 0 10*3/uL (ref 0.0–0.1)
Basophils Relative: 0 %
Eosinophils Absolute: 0.2 10*3/uL (ref 0.0–0.5)
Eosinophils Relative: 3 %
HCT: 32.7 % — ABNORMAL LOW (ref 36.0–46.0)
Hemoglobin: 10.7 g/dL — ABNORMAL LOW (ref 12.0–15.0)
Immature Granulocytes: 1 %
Lymphocytes Relative: 15 %
Lymphs Abs: 1 10*3/uL (ref 0.7–4.0)
MCH: 32.9 pg (ref 26.0–34.0)
MCHC: 32.7 g/dL (ref 30.0–36.0)
MCV: 100.6 fL — ABNORMAL HIGH (ref 80.0–100.0)
Monocytes Absolute: 0.8 10*3/uL (ref 0.1–1.0)
Monocytes Relative: 11 %
Neutro Abs: 4.9 10*3/uL (ref 1.7–7.7)
Neutrophils Relative %: 70 %
Platelet Count: 350 10*3/uL (ref 150–400)
RBC: 3.25 MIL/uL — ABNORMAL LOW (ref 3.87–5.11)
RDW: 17.1 % — ABNORMAL HIGH (ref 11.5–15.5)
WBC Count: 6.9 10*3/uL (ref 4.0–10.5)
nRBC: 0 % (ref 0.0–0.2)

## 2020-01-14 LAB — FERRITIN: Ferritin: 121 ng/mL (ref 11–307)

## 2020-01-14 MED ORDER — OXYCODONE-ACETAMINOPHEN 5-325 MG PO TABS: 1.0000 | ORAL_TABLET | Freq: Three times a day (TID) | ORAL | 0 refills | Status: DC | PRN

## 2020-01-14 MED ORDER — FAMOTIDINE 20 MG PO TABS
20.0000 mg | ORAL_TABLET | Freq: Once | ORAL | Status: AC
  Administered 2020-01-14: 20 mg via ORAL

## 2020-01-14 MED ORDER — SODIUM CHLORIDE 0.9 % IV SOLN
510.0000 mg | Freq: Once | INTRAVENOUS | Status: AC
  Administered 2020-01-14: 510 mg via INTRAVENOUS
  Filled 2020-01-14: qty 510

## 2020-01-14 MED ORDER — DIPHENHYDRAMINE HCL 50 MG/ML IJ SOLN
INTRAMUSCULAR | Status: AC
  Filled 2020-01-14: qty 1

## 2020-01-14 MED ORDER — SODIUM CHLORIDE 0.9 % IV SOLN
Freq: Once | INTRAVENOUS | Status: AC
  Filled 2020-01-14: qty 250

## 2020-01-14 MED ORDER — DIPHENHYDRAMINE HCL 50 MG/ML IJ SOLN
50.0000 mg | Freq: Once | INTRAMUSCULAR | Status: AC
  Administered 2020-01-14: 50 mg via INTRAVENOUS

## 2020-01-14 MED ORDER — FAMOTIDINE 20 MG PO TABS
ORAL_TABLET | ORAL | Status: AC
  Filled 2020-01-14: qty 1

## 2020-01-14 MED FILL — OXYCODONE-APAP 5-325MG: 5-325 | 4 days supply | Qty: 10 | Fill #0

## 2020-01-14 NOTE — Patient Instructions (Signed)

## 2020-01-15 ENCOUNTER — Telehealth: Payer: Self-pay | Admitting: Hematology

## 2020-01-15 NOTE — Telephone Encounter (Signed)
Scheduled per 8/19 los. Pt is aware of appt time and date. 

## 2020-01-26 ENCOUNTER — Encounter: Payer: Self-pay | Admitting: *Deleted

## 2020-01-26 DIAGNOSIS — Z961 Presence of intraocular lens: Secondary | ICD-10-CM | POA: Diagnosis not present

## 2020-01-26 DIAGNOSIS — E119 Type 2 diabetes mellitus without complications: Secondary | ICD-10-CM | POA: Diagnosis not present

## 2020-01-26 DIAGNOSIS — H1045 Other chronic allergic conjunctivitis: Secondary | ICD-10-CM | POA: Diagnosis not present

## 2020-01-26 DIAGNOSIS — H04123 Dry eye syndrome of bilateral lacrimal glands: Secondary | ICD-10-CM | POA: Diagnosis not present

## 2020-01-26 LAB — HM DIABETES EYE EXAM

## 2020-01-28 ENCOUNTER — Other Ambulatory Visit: Payer: Self-pay

## 2020-01-28 ENCOUNTER — Inpatient Hospital Stay: Payer: Medicare Other | Attending: Nurse Practitioner

## 2020-01-28 DIAGNOSIS — Z794 Long term (current) use of insulin: Secondary | ICD-10-CM | POA: Insufficient documentation

## 2020-01-28 DIAGNOSIS — Z23 Encounter for immunization: Secondary | ICD-10-CM | POA: Insufficient documentation

## 2020-01-28 DIAGNOSIS — D509 Iron deficiency anemia, unspecified: Secondary | ICD-10-CM | POA: Insufficient documentation

## 2020-01-28 DIAGNOSIS — Z8673 Personal history of transient ischemic attack (TIA), and cerebral infarction without residual deficits: Secondary | ICD-10-CM | POA: Diagnosis not present

## 2020-01-28 DIAGNOSIS — Z7982 Long term (current) use of aspirin: Secondary | ICD-10-CM | POA: Insufficient documentation

## 2020-01-28 DIAGNOSIS — E119 Type 2 diabetes mellitus without complications: Secondary | ICD-10-CM | POA: Diagnosis not present

## 2020-01-28 DIAGNOSIS — B182 Chronic viral hepatitis C: Secondary | ICD-10-CM | POA: Insufficient documentation

## 2020-01-28 DIAGNOSIS — K219 Gastro-esophageal reflux disease without esophagitis: Secondary | ICD-10-CM | POA: Diagnosis not present

## 2020-01-28 DIAGNOSIS — Z993 Dependence on wheelchair: Secondary | ICD-10-CM | POA: Diagnosis not present

## 2020-01-28 DIAGNOSIS — Z89612 Acquired absence of left leg above knee: Secondary | ICD-10-CM | POA: Insufficient documentation

## 2020-01-28 DIAGNOSIS — D500 Iron deficiency anemia secondary to blood loss (chronic): Secondary | ICD-10-CM

## 2020-01-28 DIAGNOSIS — Z79899 Other long term (current) drug therapy: Secondary | ICD-10-CM | POA: Diagnosis not present

## 2020-01-28 LAB — CBC WITH DIFFERENTIAL (CANCER CENTER ONLY)
Abs Immature Granulocytes: 0.11 10*3/uL — ABNORMAL HIGH (ref 0.00–0.07)
Basophils Absolute: 0 10*3/uL (ref 0.0–0.1)
Basophils Relative: 1 %
Eosinophils Absolute: 0.1 10*3/uL (ref 0.0–0.5)
Eosinophils Relative: 2 %
HCT: 29.5 % — ABNORMAL LOW (ref 36.0–46.0)
Hemoglobin: 9.6 g/dL — ABNORMAL LOW (ref 12.0–15.0)
Immature Granulocytes: 1 %
Lymphocytes Relative: 12 %
Lymphs Abs: 1 10*3/uL (ref 0.7–4.0)
MCH: 33.2 pg (ref 26.0–34.0)
MCHC: 32.5 g/dL (ref 30.0–36.0)
MCV: 102.1 fL — ABNORMAL HIGH (ref 80.0–100.0)
Monocytes Absolute: 0.8 10*3/uL (ref 0.1–1.0)
Monocytes Relative: 9 %
Neutro Abs: 6.3 10*3/uL (ref 1.7–7.7)
Neutrophils Relative %: 75 %
Platelet Count: 286 10*3/uL (ref 150–400)
RBC: 2.89 MIL/uL — ABNORMAL LOW (ref 3.87–5.11)
RDW: 15.1 % (ref 11.5–15.5)
WBC Count: 8.4 10*3/uL (ref 4.0–10.5)
nRBC: 0 % (ref 0.0–0.2)

## 2020-01-28 LAB — CMP (CANCER CENTER ONLY)
ALT: 11 U/L (ref 0–44)
AST: 17 U/L (ref 15–41)
Albumin: 3.4 g/dL — ABNORMAL LOW (ref 3.5–5.0)
Alkaline Phosphatase: 67 U/L (ref 38–126)
Anion gap: 9 (ref 5–15)
BUN: 14 mg/dL (ref 8–23)
CO2: 27 mmol/L (ref 22–32)
Calcium: 9.9 mg/dL (ref 8.9–10.3)
Chloride: 106 mmol/L (ref 98–111)
Creatinine: 1.08 mg/dL — ABNORMAL HIGH (ref 0.44–1.00)
GFR, Est AFR Am: >60 mL/min (ref >60)
GFR, Estimated: 53 mL/min — ABNORMAL LOW (ref >60)
Glucose, Bld: 104 mg/dL — ABNORMAL HIGH (ref 70–99)
Potassium: 3.8 mmol/L (ref 3.5–5.1)
Sodium: 142 mmol/L (ref 135–145)
Total Bilirubin: <0.2 mg/dL — ABNORMAL LOW (ref 0.3–1.2)
Total Protein: 6.8 g/dL (ref 6.5–8.1)

## 2020-01-28 LAB — IRON AND TIBC
Iron: 62 ug/dL (ref 41–142)
Saturation Ratios: 20 % — ABNORMAL LOW (ref 21–57)
TIBC: 314 ug/dL (ref 236–444)
UIBC: 252 ug/dL (ref 120–384)

## 2020-01-28 LAB — FERRITIN: Ferritin: 138 ng/mL (ref 11–307)

## 2020-02-02 ENCOUNTER — Telehealth: Payer: Self-pay | Admitting: *Deleted

## 2020-02-02 NOTE — Telephone Encounter (Signed)
Received faxed request for order, CMN, and OV notes addressing need for urinary incontinence supplies and Enteral Formula from [place]. at [facility]. This has not been addressed since 07/17/2019. Patient has appt with Yellow Team on 02/08/2020 and need for this can be addressed at that time. Hubbard Hartshorn, BSN, RN-BC

## 2020-02-08 ENCOUNTER — Other Ambulatory Visit: Payer: Self-pay

## 2020-02-08 ENCOUNTER — Ambulatory Visit (INDEPENDENT_AMBULATORY_CARE_PROVIDER_SITE_OTHER): Payer: Medicare Other | Admitting: Internal Medicine

## 2020-02-08 ENCOUNTER — Encounter: Payer: Self-pay | Admitting: Internal Medicine

## 2020-02-08 VITALS — BP 151/83 | HR 86 | Temp 98.8°F | Ht 62.0 in | Wt 169.0 lb

## 2020-02-08 DIAGNOSIS — Z794 Long term (current) use of insulin: Secondary | ICD-10-CM

## 2020-02-08 DIAGNOSIS — G933 Postviral fatigue syndrome: Secondary | ICD-10-CM | POA: Diagnosis not present

## 2020-02-08 DIAGNOSIS — E1140 Type 2 diabetes mellitus with diabetic neuropathy, unspecified: Secondary | ICD-10-CM

## 2020-02-08 DIAGNOSIS — R3981 Functional urinary incontinence: Secondary | ICD-10-CM | POA: Insufficient documentation

## 2020-02-08 DIAGNOSIS — R32 Unspecified urinary incontinence: Secondary | ICD-10-CM | POA: Insufficient documentation

## 2020-02-08 DIAGNOSIS — I10 Essential (primary) hypertension: Secondary | ICD-10-CM | POA: Diagnosis not present

## 2020-02-08 LAB — POCT GLYCOSYLATED HEMOGLOBIN (HGB A1C): Hemoglobin A1C: 4.4 % (ref 4.0–5.6)

## 2020-02-08 LAB — GLUCOSE, CAPILLARY: Glucose-Capillary: 117 mg/dL — ABNORMAL HIGH (ref 70–99)

## 2020-02-08 MED ORDER — AMLODIPINE BESYLATE 10 MG PO TABS: 10.0000 mg | ORAL_TABLET | Freq: Every day | ORAL | 3 refills | Status: DC

## 2020-02-08 NOTE — Assessment & Plan Note (Signed)
>>  ASSESSMENT AND PLAN FOR TYPE 2 DIABETES MELLITUS WITH DIABETIC NEUROPATHY ([facility]) WRITTEN ON 02/08/2020 10:06 AM BY AGYEI, OBED K, MD  Type 2 diabetes mellitus: Well-controlled.  Her last A1c was 5.4% in July 2021.  Her A1c today is 4.4%.  Her current diabetes regimen are Trulicity  0.7 mg weekly but she does tell me that her nephrologist started her on Farxiga  5 mg daily which was started in August.  Given her low hemoglobin A1c of 4.4%, I have advised her to stop taking Farxiga  for now.  She states that her home blood glucose is between 89- 150s.  She denies any symptoms of hypoglycemia.  Plan: -Continue Trulicity  0.75 mg weekly -Discontinue Farxiga  -Follow-up in 3 months for repeat hemoglobin A1c

## 2020-02-08 NOTE — Assessment & Plan Note (Signed)
Incontinence supplies: Ms. Stepanian has a history of urinary urgency and she states that she has not been having any issue obtaining her incontinence supplies.  She uses pull-ups and received a call from the incontinence suppliers every month without any hesitations or difficulty.

## 2020-02-08 NOTE — Progress Notes (Signed)
   CC: Assistance with glucose test strips  HPI:  [name] is a 69 y.o. with medical history significant for diabetes mellitus, hypertension here to discuss need for glucose textures.  Please see problem based charting for further details.   Past Medical History:  Diagnosis Date  . Allergic rhinitis   . Anemia, iron deficiency 05/03/2011   2/2 GI AVMs - recieves iron infusions as well as periodic red blood cell transfusions  . Anxiety   . Arteriovenous malformation of duodenum 02/25/2015  . CARPAL TUNNEL RELEASE, HX OF 07/22/2008  . Colon polyps   . Diabetes mellitus without complication ([place])    type 2  . Difficulty sleeping    takes trazadone for sleep  . Diverticulosis   . GERD (gastroesophageal reflux disease)   . Hepatitis C    in SVR with Harvoni 12/2014-02/2015.   Marland Kitchen History of hernia repair 08/18/2012  . History of porphyria 11/30/2014   Porphyria cutanea tarda in setting of chronic Hepatitis C, frequent  IV iron infusions, and alcohol abuse   . Hypertension   . Peripheral vascular disease ([place])   . Stroke [facility]) 2010   no residual deficits  . Substance use disorder    cocaine  . Tachycardia 07/10/2017  . Trichomonas vaginitis 04/16/2019   Review of Systems:  As per HPI  Physical Exam:  Vitals:   02/08/20 0924  BP: (!) 154/76  Pulse: 88  Temp: 98.8 F (37.1 C)  TempSrc: Oral  SpO2: 100%  Weight: 169 lb (76.7 kg)  Height: 5\' 2"  (1.575 m)   Physical Exam Cardiovascular:     Rate and Rhythm: Normal rate.  Pulmonary:     Effort: Pulmonary effort is normal.     Breath sounds: No rales.  Neurological:     Mental Status: She is alert.  Psychiatric:        Mood and Affect: Mood normal.        Behavior: Behavior normal.     Assessment & Plan:   See Encounters Tab for problem based charting.  Patient discussed with Dr. Rebeca Alert

## 2020-02-08 NOTE — Assessment & Plan Note (Addendum)
Hypertension-well-controlled: Prior BPs have been SBPs in the 100s-150s.  Her last blood pressure in the clinic was 137-138/78-83.  It appears that she was previously on diltiazem which was discontinued in May 2021 and was started on chlorthalidone 25 mg daily.  Chlorthalidone was discontinued during her recent hospital stay due to concern that was contributing to electrolyte abnormalities.  BP Readings from Last 3 Encounters:  02/08/20 (!) 151/83  01/14/20 137/78  01/14/20 138/83   Goal blood pressure is less than 130/80.  Given the fact that her last blood pressure recorded was unremarkable and this is an isolated increase at 151/83, will hold off on making adjustment.  Plan: -Continue Amlodipine 10 mg daily -Continue losartan 100 mg daily -Return to clinic in 1 month to follow-up on hypertension.

## 2020-02-08 NOTE — Patient Instructions (Signed)
Ms. Mifflin,  It was a pleasure taking care of you today. We will call the pharmacy to make sure you are getting your test strips.   Take care! Dr. Eileen Stanford  Please call the internal medicine center clinic if you have any questions or concerns, we may be able to help and keep you from a long and expensive emergency room wait. Our clinic and after hours phone number is [phone], the best time to call is Monday through Friday 9 am to 4 pm but there is always someone available 24/7 if you have an emergency. If you need medication refills please notify your pharmacy one week in advance and they will send [place] a request.

## 2020-02-08 NOTE — Assessment & Plan Note (Signed)
Type 2 diabetes mellitus: Well-controlled.  Her last A1c was 5.4% in July 2021.  Her A1c today is 4.4%.  Her current diabetes regimen are Trulicity 0.7 mg weekly but she does tell me that her nephrologist started her on Farxiga 5 mg daily which was started in August.  Given her low hemoglobin A1c of 4.4%, I have advised her to stop taking [place] for now.  She states that her home blood glucose is between 89- 150s.  She denies any symptoms of hypoglycemia.  Plan: -Continue Trulicity 6.77 mg weekly -Discontinue Farxiga -Follow-up in 3 months for repeat hemoglobin A1c

## 2020-02-09 NOTE — Progress Notes (Signed)
Internal Medicine Clinic Attending  Case discussed with Dr. Agyei at the time of the visit.  We reviewed the resident's history and exam and pertinent patient test results.  I agree with the assessment, diagnosis, and plan of care documented in the resident's note.  Dare Spillman, M.D., Ph.D.  

## 2020-02-10 NOTE — Telephone Encounter (Signed)
Order, CMN, and OV notes from 02/08/2020 addressing need for urinary incontinence supplies and enteral formula faxed to [facility] at [facility]. Fax confirmation receipt received. Hubbard Hartshorn, BSN, RN-BC

## 2020-02-11 ENCOUNTER — Other Ambulatory Visit: Payer: Self-pay | Admitting: Internal Medicine

## 2020-02-11 ENCOUNTER — Other Ambulatory Visit: Payer: Self-pay

## 2020-02-11 ENCOUNTER — Inpatient Hospital Stay: Payer: Medicare Other

## 2020-02-11 DIAGNOSIS — Z8673 Personal history of transient ischemic attack (TIA), and cerebral infarction without residual deficits: Secondary | ICD-10-CM | POA: Diagnosis not present

## 2020-02-11 DIAGNOSIS — Z89612 Acquired absence of left leg above knee: Secondary | ICD-10-CM | POA: Diagnosis not present

## 2020-02-11 DIAGNOSIS — Z79899 Other long term (current) drug therapy: Secondary | ICD-10-CM | POA: Diagnosis not present

## 2020-02-11 DIAGNOSIS — Z7982 Long term (current) use of aspirin: Secondary | ICD-10-CM | POA: Diagnosis not present

## 2020-02-11 DIAGNOSIS — D509 Iron deficiency anemia, unspecified: Secondary | ICD-10-CM | POA: Diagnosis not present

## 2020-02-11 DIAGNOSIS — Z794 Long term (current) use of insulin: Secondary | ICD-10-CM | POA: Diagnosis not present

## 2020-02-11 DIAGNOSIS — D500 Iron deficiency anemia secondary to blood loss (chronic): Secondary | ICD-10-CM

## 2020-02-11 DIAGNOSIS — Z993 Dependence on wheelchair: Secondary | ICD-10-CM | POA: Diagnosis not present

## 2020-02-11 DIAGNOSIS — E119 Type 2 diabetes mellitus without complications: Secondary | ICD-10-CM | POA: Diagnosis not present

## 2020-02-11 DIAGNOSIS — Z23 Encounter for immunization: Secondary | ICD-10-CM | POA: Diagnosis not present

## 2020-02-11 DIAGNOSIS — K219 Gastro-esophageal reflux disease without esophagitis: Secondary | ICD-10-CM | POA: Diagnosis not present

## 2020-02-11 LAB — CMP (CANCER CENTER ONLY)
ALT: 12 U/L (ref 0–44)
AST: 17 U/L (ref 15–41)
Albumin: 3 g/dL — ABNORMAL LOW (ref 3.5–5.0)
Alkaline Phosphatase: 61 U/L (ref 38–126)
Anion gap: 7 (ref 5–15)
BUN: 13 mg/dL (ref 8–23)
CO2: 28 mmol/L (ref 22–32)
Calcium: 8 mg/dL — ABNORMAL LOW (ref 8.9–10.3)
Chloride: 106 mmol/L (ref 98–111)
Creatinine: 1.17 mg/dL — ABNORMAL HIGH (ref 0.44–1.00)
GFR, Est AFR Am: 56 mL/min — ABNORMAL LOW (ref >60)
GFR, Estimated: 48 mL/min — ABNORMAL LOW (ref >60)
Glucose, Bld: 148 mg/dL — ABNORMAL HIGH (ref 70–99)
Potassium: 3.7 mmol/L (ref 3.5–5.1)
Sodium: 141 mmol/L (ref 135–145)
Total Bilirubin: <0.2 mg/dL — ABNORMAL LOW (ref 0.3–1.2)
Total Protein: 6.1 g/dL — ABNORMAL LOW (ref 6.5–8.1)

## 2020-02-11 LAB — IRON AND TIBC
Iron: 39 ug/dL — ABNORMAL LOW (ref 41–142)
Saturation Ratios: 14 % — ABNORMAL LOW (ref 21–57)
TIBC: 285 ug/dL (ref 236–444)
UIBC: 246 ug/dL (ref 120–384)

## 2020-02-11 LAB — CBC WITH DIFFERENTIAL (CANCER CENTER ONLY)
Abs Immature Granulocytes: 0.06 10*3/uL (ref 0.00–0.07)
Basophils Absolute: 0 10*3/uL (ref 0.0–0.1)
Basophils Relative: 1 %
Eosinophils Absolute: 0.1 10*3/uL (ref 0.0–0.5)
Eosinophils Relative: 2 %
HCT: 28.5 % — ABNORMAL LOW (ref 36.0–46.0)
Hemoglobin: 9.3 g/dL — ABNORMAL LOW (ref 12.0–15.0)
Immature Granulocytes: 1 %
Lymphocytes Relative: 12 %
Lymphs Abs: 0.8 10*3/uL (ref 0.7–4.0)
MCH: 32.9 pg (ref 26.0–34.0)
MCHC: 32.6 g/dL (ref 30.0–36.0)
MCV: 100.7 fL — ABNORMAL HIGH (ref 80.0–100.0)
Monocytes Absolute: 0.6 10*3/uL (ref 0.1–1.0)
Monocytes Relative: 9 %
Neutro Abs: 5.2 10*3/uL (ref 1.7–7.7)
Neutrophils Relative %: 75 %
Platelet Count: 312 10*3/uL (ref 150–400)
RBC: 2.83 MIL/uL — ABNORMAL LOW (ref 3.87–5.11)
RDW: 14.3 % (ref 11.5–15.5)
WBC Count: 6.9 10*3/uL (ref 4.0–10.5)
nRBC: 0 % (ref 0.0–0.2)

## 2020-02-11 LAB — FERRITIN: Ferritin: 39 ng/mL (ref 11–307)

## 2020-02-12 ENCOUNTER — Other Ambulatory Visit: Payer: Self-pay | Admitting: Internal Medicine

## 2020-02-15 ENCOUNTER — Ambulatory Visit: Payer: Medicare Other | Admitting: Hematology

## 2020-02-15 ENCOUNTER — Other Ambulatory Visit: Payer: Medicare Other

## 2020-02-24 DIAGNOSIS — Z89612 Acquired absence of left leg above knee: Secondary | ICD-10-CM | POA: Diagnosis not present

## 2020-02-24 NOTE — Progress Notes (Signed)
[place]   [phone] [phone]   Clinic Follow up Note   Patient Care Team: Jose Persia, MD as PCP - Evalina Field, MD as Consulting Physician (Ophthalmology) Coralie Keens, MD as Consulting Physician (General Surgery) Truitt Merle, MD as Consulting Physician (Hematology) Comer, Okey Regal, MD as Consulting Physician (Infectious Diseases)  Date of Service:  02/25/2020  CHIEF COMPLAINT: F/u of Anemia  CURRENT THERAPY:  IV Feraheme as neededif ferritin <100  INTERVAL HISTORY:  [name] is here for a follow up of anemia. She presents to the clinic alone. She feels more fatigue, has b/l hand tightness and pain for the past week, she feels her iron level is low Left thigh pain after last fall has resolved  She wares a life alarm now, no recurrent fall   All other systems were reviewed with the patient and are negative.  MEDICAL HISTORY:  Past Medical History:  Diagnosis Date  . Allergic rhinitis   . Anemia, iron deficiency 05/03/2011   2/2 GI AVMs - recieves iron infusions as well as periodic red blood cell transfusions  . Anxiety   . Arteriovenous malformation of duodenum 02/25/2015  . CARPAL TUNNEL RELEASE, HX OF 07/22/2008  . Colon polyps   . Diabetes mellitus without complication ([place])    type 2  . Difficulty sleeping    takes trazadone for sleep  . Diverticulosis   . GERD (gastroesophageal reflux disease)   . Hepatitis C    in SVR with Harvoni 12/2014-02/2015.   Marland Kitchen History of hernia repair 08/18/2012  . History of porphyria 11/30/2014   Porphyria cutanea tarda in setting of chronic Hepatitis C, frequent  IV iron infusions, and alcohol abuse   . Hypertension   . Peripheral vascular disease ([place])   . Stroke [facility]) 2010   no residual deficits  . Substance use disorder    cocaine  . Tachycardia 07/10/2017  . Trichomonas vaginitis 04/16/2019    SURGICAL HISTORY: Past Surgical History:  Procedure Laterality Date   . ABDOMINAL HYSTERECTOMY    . APPLICATION OF WOUND VAC N/A 06/21/2014   Procedure: APPLICATION OF WOUND VAC;  Surgeon: Coralie Keens, MD;  Location: [place];  Service: General;  Laterality: N/A;  . CARPAL TUNNEL RELEASE     rt hand  . COLONOSCOPY WITH PROPOFOL N/A 03/17/2018   Procedure: COLONOSCOPY WITH PROPOFOL;  Surgeon: Doran Stabler, MD;  Location: [facility];  Service: Gastroenterology;  Laterality: N/A;  . ENTEROSCOPY N/A 12/04/2012   Procedure: ENTEROSCOPY;  Surgeon: Beryle Beams, MD;  Location: [facility];  Service: Endoscopy;  Laterality: N/A;  . ENTEROSCOPY N/A 12/18/2012   Procedure: ENTEROSCOPY;  Surgeon: Beryle Beams, MD;  Location: [facility];  Service: Endoscopy;  Laterality: N/A;  . ENTEROSCOPY N/A 02/25/2015   Procedure: ENTEROSCOPY;  Surgeon: Inda Castle, MD;  Location: [facility];  Service: Endoscopy;  Laterality: N/A;  . ENTEROSCOPY N/A 10/29/2017   Procedure: ENTEROSCOPY;  Surgeon: Doran Stabler, MD;  Location: [facility];  Service: Gastroenterology;  Laterality: N/A;  . ESOPHAGOGASTRODUODENOSCOPY  05/05/2011   Procedure: ESOPHAGOGASTRODUODENOSCOPY (EGD);  Surgeon: Zenovia Jarred, MD;  Location: [facility];  Service: Gastroenterology;  Laterality: N/A;  Dr. Hilarie Fredrickson will do procedure for Dr. Benson Norway Saturday.  . ESOPHAGOGASTRODUODENOSCOPY  05/08/2011   Procedure: ESOPHAGOGASTRODUODENOSCOPY (EGD);  Surgeon: Beryle Beams;  Location: [facility];  Service: Endoscopy;  Laterality: N/A;  . ESOPHAGOGASTRODUODENOSCOPY  06/07/2011   Procedure: ESOPHAGOGASTRODUODENOSCOPY (EGD);  Surgeon: Tory Emerald  Benson Norway, MD;  Location: [facility];  Service: Endoscopy;  Laterality: N/A;  . ESOPHAGOGASTRODUODENOSCOPY  12/20/2011   Procedure: ESOPHAGOGASTRODUODENOSCOPY (EGD);  Surgeon: Beryle Beams, MD;  Location: [facility];  Service: Endoscopy;  Laterality: N/A;  . EYE SURGERY Bilateral    cataracts  . FLEXIBLE SIGMOIDOSCOPY  12/21/2011   Procedure: FLEXIBLE SIGMOIDOSCOPY;   Surgeon: Beryle Beams, MD;  [facility]: [facility];  Service: Endoscopy;  Laterality: N/A;  . HOT HEMOSTASIS  06/07/2011   Procedure: HOT HEMOSTASIS (ARGON PLASMA COAGULATION/BICAP);  Surgeon: Beryle Beams, MD;  Location: [facility];  Service: Endoscopy;  Laterality: N/A;  . INCISIONAL HERNIA REPAIR N/A 06/01/2014   Procedure: ATTEMPTED LAPAROSCOPIC AND OPEN INCISIONAL HERNIA REPAIR WITH MESH;  Surgeon: Coralie Keens, MD;  Location: [place];  Service: General;  Laterality: N/A;  . INSERTION OF MESH N/A 06/01/2014   Procedure: INSERTION OF MESH;  Surgeon: Coralie Keens, MD;  Location: [place];  Service: General;  Laterality: N/A;  . LAPAROTOMY  02/16/2012   Procedure: EXPLORATORY LAPAROTOMY;  Surgeon: Edward Jolly, MD;  Location: [facility];  Service: General;  Laterality: N/A;  oversewing of anastomotic leak and rigid sigmoidoscopy  . LAPAROTOMY N/A 06/21/2014   Procedure: ABDOMINAL WOUND EXPLORATION;  Surgeon: Coralie Keens, MD;  Location: [place];  Service: General;  Laterality: N/A;  . LEG AMPUTATION ABOVE KNEE     left  . PARTIAL COLECTOMY  02/15/2012   Procedure: PARTIAL COLECTOMY;  Surgeon: Harl Bowie, MD;  Location: [facility];  Service: General;  Laterality: N/A;  . TONSILLECTOMY      I have reviewed the social history and family history with the patient and they are unchanged from previous note.  ALLERGIES:  is allergic to ciprofloxacin, morphine and related, penicillins, codeine, lisinopril, and feraheme [ferumoxytol].  MEDICATIONS:  Current Outpatient Medications  Medication Sig Dispense Refill  . allopurinol (ZYLOPRIM) 100 MG tablet TAKE 1 TABLET (100 MG TOTAL) BY MOUTH DAILY. 30 tablet 5  . amLODipine (NORVASC) 10 MG tablet Take 1 tablet (10 mg total) by mouth daily. 90 tablet 3  . aspirin EC 81 MG tablet Take 1 tablet (81 mg total) by mouth daily. 30 tablet 1  . cetirizine (ZYRTEC) 10 MG tablet Take 1 tablet (10 mg total) by mouth daily. 30 tablet 0  .  cholecalciferol (VITAMIN D) 1000 units tablet Take 1 tablet (1,000 Units total) by mouth daily. 100 tablet 2  . colchicine 0.6 MG tablet TAKE 1 TABLET (0.6 MG TOTAL) BY MOUTH DAILY. 30 tablet 1  . diphenhydramine-acetaminophen (TYLENOL PM) 25-500 MG TABS tablet Take 1 tablet by mouth at bedtime as needed (sleep).    . Dulaglutide (TRULICITY) 1.51 VO/1.6WV SOPN Inject 0.5 mLs (0.75 mg total) into the skin once a week. 6 mL 6  . feeding supplement, GLUCERNA SHAKE, (GLUCERNA SHAKE) LIQD Take 237 mLs by mouth 3 (three) times daily between meals. (Patient taking differently: Take 237 mLs by mouth 2 (two) times daily between meals. ) 90 Can 0  . gabapentin (NEURONTIN) 400 MG capsule Take 1 capsule in the morning, 1 in the afternoon and 2 before bedtime (Patient taking differently: Take 400-800 mg by mouth See admin instructions. Take 1 capsule (468m) in the morning, 1 capsule (408m in the afternoon and 2  Capsules (80078mbefore bedtime) 120 capsule 5  . losartan (COZAAR) 100 MG tablet Take 1 tablet (100 mg total) by mouth daily. 90 tablet 3  . metFORMIN (GLUCOPHAGE) 1000 MG tablet TAKE 1 TABLET BY  MOUTH TWICE A DAY WITH A MEAL (Patient taking differently: Take 1,000 mg by mouth 2 (two) times daily with a meal. TAKE 1 TABLET BY MOUTH TWICE A DAY WITH A MEAL) 180 tablet 1  . omeprazole (PRILOSEC) 20 MG capsule TAKE 1 CAPSULE BY MOUTH EVERY DAY 30 capsule 0  . oxyCODONE-acetaminophen (PERCOCET/ROXICET) 5-325 MG tablet Take 1 tablet by mouth every 8 (eight) hours as needed for severe pain. 10 tablet 0  . traZODone (DESYREL) 50 MG tablet Take 1 tablet (50 mg total) by mouth at bedtime as needed for sleep. 30 tablet 0  . vitamin C (ASCORBIC ACID) 500 MG tablet Take 500 mg by mouth every morning.    Marland Kitchen ACCU-CHEK FASTCLIX LANCETS MISC Use to test blood glucose 3 times daily. 100 each 11  . Blood Glucose Monitoring Suppl (ACCU-CHEK GUIDE) w/Device KIT 1 each by Does not apply route 3 (three) times daily. The  patient is insulin requiring, ICD 10 code E11.65. The patient tests 3 times a day. 1 kit 1  . glucose blood (ACCU-CHEK GUIDE) test strip Use as to check blood sugar 3 times daily 300 strip 1  . Insulin Pen Needle (UNIFINE PENTIPS) 32G X 4 MM MISC Use to inject insulin once a day. The patient is insulin requiring, ICD 10 code E11.9. The patient injects 1 times per day. 100 each 2   No current facility-administered medications for this visit.    PHYSICAL EXAMINATION: ECOG PERFORMANCE STATUS: 3 - Symptomatic, >50% confined to bed  Vitals:   02/25/20 1041  BP: (!) 142/82  Pulse: (!) 111  Resp: 18  Temp: 97.8 F (36.6 C)  SpO2: 100%   Filed Weights   02/25/20 1041  Weight: 172 lb 6.4 oz (78.2 kg)    GENERAL:alert, no distress and comfortable SKIN: skin color, texture, turgor are normal, no rashes or significant lesions EYES: normal, Conjunctiva are pink and non-injected, sclera clear Musculoskeletal:no cyanosis of digits and no clubbing, left AKA NEURO: alert & oriented x 3 with fluent speech, no focal motor/sensory deficits  LABORATORY DATA:  I have reviewed the data as listed CBC Latest Ref Rng & Units 02/25/2020 02/11/2020 01/28/2020  WBC 4.0 - 10.5 K/uL 8.1 6.9 8.4  Hemoglobin 12.0 - 15.0 g/dL 8.8(L) 9.3(L) 9.6(L)  Hematocrit 36 - 46 % 28.2(L) 28.5(L) 29.5(L)  Platelets 150 - 400 K/uL 457(H) 312 286     CMP Latest Ref Rng & Units 02/25/2020 02/11/2020 01/28/2020  Glucose 70 - 99 mg/dL 249(H) 148(H) 104(H)  BUN 8 - 23 mg/dL _0 Creatinine 0.44 - 1.00 mg/dL 1.22(H) 1.17(H) 1.08(H)  Sodium 135 - 145 mmol/L 140 141 142  Potassium 3.5 - 5.1 mmol/L 3.4(L) 3.7 3.8  Chloride 98 - 111 mmol/L 104 106 106  CO2 22 - 32 mmol/L _1 Calcium 8.9 - 10.3 mg/dL 8.4(L) 8.0(L) 9.9  Total Protein 6.5 - 8.1 g/dL 6.5 6.1(L) 6.8  Total Bilirubin 0.3 - 1.2 mg/dL <0.2(L) <0.2(L) <0.2(L)  Alkaline Phos 38 - 126 U/L 65 61 67  AST 15 - 41 U/L _2 ALT 0 - 44 U/L _3 RADIOGRAPHIC STUDIES: I have personally reviewed the radiological images as listed and agreed with the findings in the report. No results found.   ASSESSMENT & PLAN:  [name] is a 69 y.o. female with     1. Iron deficiency anemia secondary to GIbloodloss,andAnemia of Chronic Disease  -History  of multiple GI bleeding in 2016, required blood transfusion, hospitalization and IV Feraheme -She had an EGD and colonoscopy on 10/29/2017 and 03/17/2018 with Dr. Loletha Carrow, which was normal.  -She endoscopy in [facility] and was found to have AVM in small bowel.  -She has been on oral iron for 5-6 years now and has had intermittent black watery stool.  -She has been receiving IV Feraheme every1-57monthon average lately.Goal is to keep her Hg >10and ferritin>100. Her last IV Feraheme was8/19/21.   -Lab reviewed, worsening anemia, iron studies still pending.  We will arrange Feraheme tomorrow and again in 2 weeks. -Lab every 2 weeks, follow-up in 6 weeks  2. Porphyria cutaneoustarda (PCT), resolvedafter Hep C Treatment  3.Chronic hepatitis C infection -She previously tested positive for hepatitis C in 2009. She has previously completed treatment for hepatitis C in 2016 and repeated test was negative.  -She had an EGD and colonoscopy on 10/29/2017 and 03/17/2018 with Dr. DLoletha Carrow which was normal. SDorothyann Gibbscontinue to follow up with him. -05/2018 liver [place].   4. DM2 -Management per PCP's office.  5. Left AKA, hasprosthesis, she is wheelchair bound most of time, Right LE Edema  -LE edema could be related to nutritional deficiencies or decreased venous return. I encouraged her to elevate her right leg.  -On 12/30/19 she had a mechanical fall, resulting in significant tenderness in the left elbow and left stump at amputation above knee. There were no fractures on her 01/01/20 Xray.  -For pain she has been on Oxycodone q6hours. Her left shoulder pain has  improved but her left stump pain mostly stable. -She has been seen by her orthopedic surgeon and prosthetic doctor who is making her a new prosthetic leg to use once her pain improves.   6. Smoking cessation  -She is still actively smoking,she isnot ready to quit, however, she knows that she needs to quit    Plan -Lab reviewed, will schedule Feraheme tomorrow and again in 2 weeks -Flu shot today, she is scheduled for choledochal shot next week -Lab every 2 weeks, will set up IV Feraheme if ferritin less than 100 -Follow-up in 6 weeks -She knows to [place] go to [place] if she has flank significant GI bleeding.   No problem-specific Assessment & Plan notes found for this encounter.   No orders of the defined types were placed in this encounter.  All questions were answered. The patient knows to call the clinic with any problems, questions or concerns. No barriers to learning was detected. The total time spent in the appointment was 25 minutes.     YTruitt Merle MD 02/25/2020   I, AJoslyn Devon am acting as scribe for YTruitt Merle MD.   I have reviewed the above documentation for accuracy and completeness, and I agree with the above.

## 2020-02-25 ENCOUNTER — Inpatient Hospital Stay: Payer: Medicare Other

## 2020-02-25 ENCOUNTER — Other Ambulatory Visit: Payer: Self-pay

## 2020-02-25 ENCOUNTER — Telehealth: Payer: Self-pay | Admitting: Hematology

## 2020-02-25 ENCOUNTER — Encounter: Payer: Self-pay | Admitting: Hematology

## 2020-02-25 ENCOUNTER — Inpatient Hospital Stay (HOSPITAL_BASED_OUTPATIENT_CLINIC_OR_DEPARTMENT_OTHER): Payer: Medicare Other | Admitting: Hematology

## 2020-02-25 VITALS — BP 142/82 | HR 111 | Temp 97.8°F | Resp 18 | Ht 62.0 in | Wt 172.4 lb

## 2020-02-25 DIAGNOSIS — I10 Essential (primary) hypertension: Secondary | ICD-10-CM

## 2020-02-25 DIAGNOSIS — Z89612 Acquired absence of left leg above knee: Secondary | ICD-10-CM | POA: Diagnosis not present

## 2020-02-25 DIAGNOSIS — Z23 Encounter for immunization: Secondary | ICD-10-CM

## 2020-02-25 DIAGNOSIS — Z8619 Personal history of other infectious and parasitic diseases: Secondary | ICD-10-CM | POA: Diagnosis not present

## 2020-02-25 DIAGNOSIS — K219 Gastro-esophageal reflux disease without esophagitis: Secondary | ICD-10-CM | POA: Diagnosis not present

## 2020-02-25 DIAGNOSIS — Z794 Long term (current) use of insulin: Secondary | ICD-10-CM | POA: Diagnosis not present

## 2020-02-25 DIAGNOSIS — Z993 Dependence on wheelchair: Secondary | ICD-10-CM | POA: Diagnosis not present

## 2020-02-25 DIAGNOSIS — D500 Iron deficiency anemia secondary to blood loss (chronic): Secondary | ICD-10-CM | POA: Diagnosis not present

## 2020-02-25 DIAGNOSIS — E119 Type 2 diabetes mellitus without complications: Secondary | ICD-10-CM | POA: Diagnosis not present

## 2020-02-25 DIAGNOSIS — Z79899 Other long term (current) drug therapy: Secondary | ICD-10-CM | POA: Diagnosis not present

## 2020-02-25 DIAGNOSIS — Z7982 Long term (current) use of aspirin: Secondary | ICD-10-CM | POA: Diagnosis not present

## 2020-02-25 DIAGNOSIS — Z8673 Personal history of transient ischemic attack (TIA), and cerebral infarction without residual deficits: Secondary | ICD-10-CM | POA: Diagnosis not present

## 2020-02-25 DIAGNOSIS — D509 Iron deficiency anemia, unspecified: Secondary | ICD-10-CM | POA: Diagnosis not present

## 2020-02-25 LAB — CMP (CANCER CENTER ONLY)
ALT: 11 U/L (ref 0–44)
AST: 15 U/L (ref 15–41)
Albumin: 3.1 g/dL — ABNORMAL LOW (ref 3.5–5.0)
Alkaline Phosphatase: 65 U/L (ref 38–126)
Anion gap: 9 (ref 5–15)
BUN: 15 mg/dL (ref 8–23)
CO2: 27 mmol/L (ref 22–32)
Calcium: 8.4 mg/dL — ABNORMAL LOW (ref 8.9–10.3)
Chloride: 104 mmol/L (ref 98–111)
Creatinine: 1.22 mg/dL — ABNORMAL HIGH (ref 0.44–1.00)
GFR, Est AFR Am: 53 mL/min — ABNORMAL LOW (ref >60)
GFR, Estimated: 46 mL/min — ABNORMAL LOW (ref >60)
Glucose, Bld: 249 mg/dL — ABNORMAL HIGH (ref 70–99)
Potassium: 3.4 mmol/L — ABNORMAL LOW (ref 3.5–5.1)
Sodium: 140 mmol/L (ref 135–145)
Total Bilirubin: <0.2 mg/dL — ABNORMAL LOW (ref 0.3–1.2)
Total Protein: 6.5 g/dL (ref 6.5–8.1)

## 2020-02-25 LAB — CBC WITH DIFFERENTIAL (CANCER CENTER ONLY)
Abs Immature Granulocytes: 0.06 10*3/uL (ref 0.00–0.07)
Basophils Absolute: 0.1 10*3/uL (ref 0.0–0.1)
Basophils Relative: 1 %
Eosinophils Absolute: 0.2 10*3/uL (ref 0.0–0.5)
Eosinophils Relative: 2 %
HCT: 28.2 % — ABNORMAL LOW (ref 36.0–46.0)
Hemoglobin: 8.8 g/dL — ABNORMAL LOW (ref 12.0–15.0)
Immature Granulocytes: 1 %
Lymphocytes Relative: 12 %
Lymphs Abs: 1 10*3/uL (ref 0.7–4.0)
MCH: 29.5 pg (ref 26.0–34.0)
MCHC: 31.2 g/dL (ref 30.0–36.0)
MCV: 94.6 fL (ref 80.0–100.0)
Monocytes Absolute: 0.6 10*3/uL (ref 0.1–1.0)
Monocytes Relative: 8 %
Neutro Abs: 6.2 10*3/uL (ref 1.7–7.7)
Neutrophils Relative %: 76 %
Platelet Count: 457 10*3/uL — ABNORMAL HIGH (ref 150–400)
RBC: 2.98 MIL/uL — ABNORMAL LOW (ref 3.87–5.11)
RDW: 15.3 % (ref 11.5–15.5)
WBC Count: 8.1 10*3/uL (ref 4.0–10.5)
nRBC: 0 % (ref 0.0–0.2)

## 2020-02-25 LAB — IRON AND TIBC
Iron: 20 ug/dL — ABNORMAL LOW (ref 41–142)
Saturation Ratios: 5 % — ABNORMAL LOW (ref 21–57)
TIBC: 370 ug/dL (ref 236–444)
UIBC: 350 ug/dL (ref 120–384)

## 2020-02-25 LAB — FERRITIN: Ferritin: 17 ng/mL (ref 11–307)

## 2020-02-25 MED ORDER — INFLUENZA VAC A&B SA ADJ QUAD 0.5 ML IM PRSY
0.5000 mL | PREFILLED_SYRINGE | Freq: Once | INTRAMUSCULAR | Status: AC
  Administered 2020-02-25: 0.5 mL via INTRAMUSCULAR

## 2020-02-25 MED ORDER — INFLUENZA VAC A&B SA ADJ QUAD 0.5 ML IM PRSY
PREFILLED_SYRINGE | INTRAMUSCULAR | Status: AC
  Filled 2020-02-25: qty 0.5

## 2020-02-25 NOTE — Telephone Encounter (Signed)
Scheduled appointments per 9/30 los. Gave patient updated calendar.  

## 2020-02-26 ENCOUNTER — Inpatient Hospital Stay: Payer: Medicare Other | Attending: Nurse Practitioner

## 2020-02-26 ENCOUNTER — Other Ambulatory Visit: Payer: Self-pay

## 2020-02-26 VITALS — BP 116/76 | HR 98 | Temp 98.6°F | Resp 20

## 2020-02-26 DIAGNOSIS — Z89612 Acquired absence of left leg above knee: Secondary | ICD-10-CM | POA: Insufficient documentation

## 2020-02-26 DIAGNOSIS — K922 Gastrointestinal hemorrhage, unspecified: Secondary | ICD-10-CM | POA: Diagnosis not present

## 2020-02-26 DIAGNOSIS — E119 Type 2 diabetes mellitus without complications: Secondary | ICD-10-CM | POA: Diagnosis not present

## 2020-02-26 DIAGNOSIS — M79652 Pain in left thigh: Secondary | ICD-10-CM | POA: Insufficient documentation

## 2020-02-26 DIAGNOSIS — D500 Iron deficiency anemia secondary to blood loss (chronic): Secondary | ICD-10-CM

## 2020-02-26 DIAGNOSIS — B182 Chronic viral hepatitis C: Secondary | ICD-10-CM | POA: Diagnosis not present

## 2020-02-26 DIAGNOSIS — Z79899 Other long term (current) drug therapy: Secondary | ICD-10-CM | POA: Diagnosis not present

## 2020-02-26 DIAGNOSIS — R600 Localized edema: Secondary | ICD-10-CM | POA: Insufficient documentation

## 2020-02-26 MED ORDER — DIPHENHYDRAMINE HCL 50 MG/ML IJ SOLN
50.0000 mg | Freq: Once | INTRAMUSCULAR | Status: AC
  Administered 2020-02-26: 50 mg via INTRAVENOUS

## 2020-02-26 MED ORDER — SODIUM CHLORIDE 0.9 % IV SOLN
510.0000 mg | Freq: Once | INTRAVENOUS | Status: AC
  Administered 2020-02-26: 510 mg via INTRAVENOUS
  Filled 2020-02-26: qty 510

## 2020-02-26 MED ORDER — FAMOTIDINE 20 MG PO TABS
ORAL_TABLET | ORAL | Status: AC
  Filled 2020-02-26: qty 1

## 2020-02-26 MED ORDER — FAMOTIDINE 20 MG PO TABS
20.0000 mg | ORAL_TABLET | Freq: Once | ORAL | Status: AC
  Administered 2020-02-26: 20 mg via ORAL

## 2020-02-26 MED ORDER — SODIUM CHLORIDE 0.9 % IV SOLN
INTRAVENOUS | Status: DC
  Filled 2020-02-26: qty 250

## 2020-02-26 MED ORDER — DIPHENHYDRAMINE HCL 50 MG/ML IJ SOLN
INTRAMUSCULAR | Status: AC
  Filled 2020-02-26: qty 1

## 2020-02-26 NOTE — Patient Instructions (Signed)

## 2020-03-01 DIAGNOSIS — G933 Postviral fatigue syndrome: Secondary | ICD-10-CM | POA: Diagnosis not present

## 2020-03-10 ENCOUNTER — Other Ambulatory Visit: Payer: Self-pay | Admitting: Internal Medicine

## 2020-03-10 DIAGNOSIS — S78112A Complete traumatic amputation at level between left hip and knee, initial encounter: Secondary | ICD-10-CM

## 2020-03-10 DIAGNOSIS — I10 Essential (primary) hypertension: Secondary | ICD-10-CM

## 2020-03-10 DIAGNOSIS — M109 Gout, unspecified: Secondary | ICD-10-CM

## 2020-03-11 ENCOUNTER — Inpatient Hospital Stay: Payer: Medicare Other

## 2020-03-11 ENCOUNTER — Other Ambulatory Visit: Payer: Self-pay

## 2020-03-11 VITALS — BP 132/83 | HR 82 | Temp 99.0°F | Resp 18

## 2020-03-11 DIAGNOSIS — D500 Iron deficiency anemia secondary to blood loss (chronic): Secondary | ICD-10-CM

## 2020-03-11 DIAGNOSIS — K922 Gastrointestinal hemorrhage, unspecified: Secondary | ICD-10-CM | POA: Diagnosis not present

## 2020-03-11 DIAGNOSIS — R600 Localized edema: Secondary | ICD-10-CM | POA: Diagnosis not present

## 2020-03-11 DIAGNOSIS — Z89612 Acquired absence of left leg above knee: Secondary | ICD-10-CM | POA: Diagnosis not present

## 2020-03-11 DIAGNOSIS — E119 Type 2 diabetes mellitus without complications: Secondary | ICD-10-CM | POA: Diagnosis not present

## 2020-03-11 DIAGNOSIS — M79652 Pain in left thigh: Secondary | ICD-10-CM | POA: Diagnosis not present

## 2020-03-11 DIAGNOSIS — Z79899 Other long term (current) drug therapy: Secondary | ICD-10-CM | POA: Diagnosis not present

## 2020-03-11 LAB — CBC WITH DIFFERENTIAL (CANCER CENTER ONLY)
Abs Immature Granulocytes: 0.06 10*3/uL (ref 0.00–0.07)
Basophils Absolute: 0 10*3/uL (ref 0.0–0.1)
Basophils Relative: 0 %
Eosinophils Absolute: 0.1 10*3/uL (ref 0.0–0.5)
Eosinophils Relative: 2 %
HCT: 30.2 % — ABNORMAL LOW (ref 36.0–46.0)
Hemoglobin: 9.4 g/dL — ABNORMAL LOW (ref 12.0–15.0)
Immature Granulocytes: 1 %
Lymphocytes Relative: 14 %
Lymphs Abs: 1.2 10*3/uL (ref 0.7–4.0)
MCH: 29.1 pg (ref 26.0–34.0)
MCHC: 31.1 g/dL (ref 30.0–36.0)
MCV: 93.5 fL (ref 80.0–100.0)
Monocytes Absolute: 0.9 10*3/uL (ref 0.1–1.0)
Monocytes Relative: 11 %
Neutro Abs: 5.9 10*3/uL (ref 1.7–7.7)
Neutrophils Relative %: 72 %
Platelet Count: 370 10*3/uL (ref 150–400)
RBC: 3.23 MIL/uL — ABNORMAL LOW (ref 3.87–5.11)
RDW: 17.7 % — ABNORMAL HIGH (ref 11.5–15.5)
WBC Count: 8.3 10*3/uL (ref 4.0–10.5)
nRBC: 0 % (ref 0.0–0.2)

## 2020-03-11 LAB — CMP (CANCER CENTER ONLY)
ALT: 9 U/L (ref 0–44)
AST: 15 U/L (ref 15–41)
Albumin: 3 g/dL — ABNORMAL LOW (ref 3.5–5.0)
Alkaline Phosphatase: 67 U/L (ref 38–126)
Anion gap: 7 (ref 5–15)
BUN: 16 mg/dL (ref 8–23)
CO2: 26 mmol/L (ref 22–32)
Calcium: 8 mg/dL — ABNORMAL LOW (ref 8.9–10.3)
Chloride: 109 mmol/L (ref 98–111)
Creatinine: 1.23 mg/dL — ABNORMAL HIGH (ref 0.44–1.00)
GFR, Estimated: 45 mL/min — ABNORMAL LOW (ref >60)
Glucose, Bld: 103 mg/dL — ABNORMAL HIGH (ref 70–99)
Potassium: 3.6 mmol/L (ref 3.5–5.1)
Sodium: 142 mmol/L (ref 135–145)
Total Bilirubin: <0.2 mg/dL — ABNORMAL LOW (ref 0.3–1.2)
Total Protein: 6.4 g/dL — ABNORMAL LOW (ref 6.5–8.1)

## 2020-03-11 LAB — IRON AND TIBC
Iron: 34 ug/dL — ABNORMAL LOW (ref 41–142)
Saturation Ratios: 11 % — ABNORMAL LOW (ref 21–57)
TIBC: 298 ug/dL (ref 236–444)
UIBC: 264 ug/dL (ref 120–384)

## 2020-03-11 LAB — FERRITIN: Ferritin: 75 ng/mL (ref 11–307)

## 2020-03-11 MED ORDER — FAMOTIDINE 20 MG PO TABS
ORAL_TABLET | ORAL | Status: AC
  Filled 2020-03-11: qty 1

## 2020-03-11 MED ORDER — DIPHENHYDRAMINE HCL 50 MG/ML IJ SOLN
INTRAMUSCULAR | Status: AC
  Filled 2020-03-11: qty 1

## 2020-03-11 MED ORDER — FAMOTIDINE 20 MG PO TABS
20.0000 mg | ORAL_TABLET | Freq: Once | ORAL | Status: AC
  Administered 2020-03-11: 20 mg via ORAL

## 2020-03-11 MED ORDER — SODIUM CHLORIDE 0.9 % IV SOLN
510.0000 mg | Freq: Once | INTRAVENOUS | Status: AC
  Administered 2020-03-11: 510 mg via INTRAVENOUS
  Filled 2020-03-11: qty 510

## 2020-03-11 MED ORDER — SODIUM CHLORIDE 0.9 % IV SOLN
INTRAVENOUS | Status: DC
  Filled 2020-03-11: qty 250

## 2020-03-11 MED ORDER — DIPHENHYDRAMINE HCL 50 MG/ML IJ SOLN
50.0000 mg | Freq: Once | INTRAMUSCULAR | Status: AC
  Administered 2020-03-11: 50 mg via INTRAVENOUS

## 2020-03-11 NOTE — Patient Instructions (Signed)

## 2020-03-14 NOTE — Telephone Encounter (Signed)
Dilt XR not refilled as this was discontinued in 09/2019 and patient is no longer taking.

## 2020-03-22 MED FILL — TRULICITY 0.75 MG/0.5 ML PE: 0.75 | 84 days supply | Qty: 6 | Fill #2

## 2020-03-23 ENCOUNTER — Inpatient Hospital Stay: Payer: Medicare Other

## 2020-03-23 ENCOUNTER — Other Ambulatory Visit: Payer: Self-pay

## 2020-03-23 ENCOUNTER — Ambulatory Visit: Payer: Medicare Other

## 2020-03-23 DIAGNOSIS — Z79899 Other long term (current) drug therapy: Secondary | ICD-10-CM | POA: Diagnosis not present

## 2020-03-23 DIAGNOSIS — E119 Type 2 diabetes mellitus without complications: Secondary | ICD-10-CM | POA: Diagnosis not present

## 2020-03-23 DIAGNOSIS — R600 Localized edema: Secondary | ICD-10-CM | POA: Diagnosis not present

## 2020-03-23 DIAGNOSIS — M79652 Pain in left thigh: Secondary | ICD-10-CM | POA: Diagnosis not present

## 2020-03-23 DIAGNOSIS — Z89612 Acquired absence of left leg above knee: Secondary | ICD-10-CM | POA: Diagnosis not present

## 2020-03-23 DIAGNOSIS — K922 Gastrointestinal hemorrhage, unspecified: Secondary | ICD-10-CM | POA: Diagnosis not present

## 2020-03-23 DIAGNOSIS — D500 Iron deficiency anemia secondary to blood loss (chronic): Secondary | ICD-10-CM

## 2020-03-23 LAB — CBC WITH DIFFERENTIAL (CANCER CENTER ONLY)
Abs Immature Granulocytes: 0.06 10*3/uL (ref 0.00–0.07)
Basophils Absolute: 0 10*3/uL (ref 0.0–0.1)
Basophils Relative: 0 %
Eosinophils Absolute: 0.1 10*3/uL (ref 0.0–0.5)
Eosinophils Relative: 1 %
HCT: 32.9 % — ABNORMAL LOW (ref 36.0–46.0)
Hemoglobin: 10.4 g/dL — ABNORMAL LOW (ref 12.0–15.0)
Immature Granulocytes: 1 %
Lymphocytes Relative: 10 %
Lymphs Abs: 0.8 10*3/uL (ref 0.7–4.0)
MCH: 30.1 pg (ref 26.0–34.0)
MCHC: 31.6 g/dL (ref 30.0–36.0)
MCV: 95.4 fL (ref 80.0–100.0)
Monocytes Absolute: 0.7 10*3/uL (ref 0.1–1.0)
Monocytes Relative: 9 %
Neutro Abs: 6.4 10*3/uL (ref 1.7–7.7)
Neutrophils Relative %: 79 %
Platelet Count: 278 10*3/uL (ref 150–400)
RBC: 3.45 MIL/uL — ABNORMAL LOW (ref 3.87–5.11)
RDW: 20 % — ABNORMAL HIGH (ref 11.5–15.5)
WBC Count: 8 10*3/uL (ref 4.0–10.5)
nRBC: 0 % (ref 0.0–0.2)

## 2020-03-23 LAB — CMP (CANCER CENTER ONLY)
ALT: 11 U/L (ref 0–44)
AST: 17 U/L (ref 15–41)
Albumin: 3.2 g/dL — ABNORMAL LOW (ref 3.5–5.0)
Alkaline Phosphatase: 63 U/L (ref 38–126)
Anion gap: 8 (ref 5–15)
BUN: 15 mg/dL (ref 8–23)
CO2: 29 mmol/L (ref 22–32)
Calcium: 8.6 mg/dL — ABNORMAL LOW (ref 8.9–10.3)
Chloride: 106 mmol/L (ref 98–111)
Creatinine: 1.14 mg/dL — ABNORMAL HIGH (ref 0.44–1.00)
GFR, Estimated: 53 mL/min — ABNORMAL LOW (ref >60)
Glucose, Bld: 133 mg/dL — ABNORMAL HIGH (ref 70–99)
Potassium: 3.3 mmol/L — ABNORMAL LOW (ref 3.5–5.1)
Sodium: 143 mmol/L (ref 135–145)
Total Bilirubin: <0.2 mg/dL — ABNORMAL LOW (ref 0.3–1.2)
Total Protein: 6.3 g/dL — ABNORMAL LOW (ref 6.5–8.1)

## 2020-03-23 LAB — IRON AND TIBC
Iron: 55 ug/dL (ref 41–142)
Saturation Ratios: 20 % — ABNORMAL LOW (ref 21–57)
TIBC: 278 ug/dL (ref 236–444)
UIBC: 222 ug/dL (ref 120–384)

## 2020-03-23 LAB — FERRITIN: Ferritin: 170 ng/mL (ref 11–307)

## 2020-03-29 ENCOUNTER — Telehealth: Payer: Self-pay

## 2020-03-29 NOTE — Telephone Encounter (Signed)
-----   Message from Alla Feeling, NP sent at 03/28/2020  2:57 PM EDT ----- Please let her know ferritin 170 ,she does not need IV iron right now. Please review her upcoming appointments in November.   Thanks, Regan Rakers

## 2020-03-29 NOTE — Telephone Encounter (Signed)
Pt made aware of lab results and new recommendations

## 2020-04-01 NOTE — Progress Notes (Signed)
[place]   [phone] [phone]   Clinic Follow up Note   Patient Care Team: Jose Persia, MD as PCP - Evalina Field, MD as Consulting Physician (Ophthalmology) Coralie Keens, MD as Consulting Physician (General Surgery) Truitt Merle, MD as Consulting Physician (Hematology) Comer, Okey Regal, MD as Consulting Physician (Infectious Diseases)  Date of Service:  04/06/2020  CHIEF COMPLAINT: F/u of Anemia   CURRENT THERAPY:  IV Feraheme as neededif ferritin <100  INTERVAL HISTORY:  [name] is here for a follow up of anemia. She presents to the clinic alone. She notes she is doing well. She denies any recent GI bleeding. She notes she was to see Dr Abner Greenspan today about the blood in her stool but had to reschedule to May 17, 2020. She notes I referred her. She plans to call them back for more information. She notes she received her COVID booster in 02/2020 and was told to return for another booster in 6 months. She notes she also gets 2 flu shots a year (March and October).     REVIEW OF SYSTEMS:   Constitutional: Denies fevers, chills or abnormal weight loss Eyes: Denies blurriness of vision Ears, nose, mouth, throat, and face: Denies mucositis or sore throat Respiratory: Denies cough, dyspnea or wheezes Cardiovascular: Denies palpitation, chest discomfort or lower extremity swelling Gastrointestinal:  Denies nausea, heartburn or change in bowel habits Skin: Denies abnormal skin rashes Lymphatics: Denies new lymphadenopathy or easy bruising Neurological:Denies numbness, tingling or new weaknesses Behavioral/Psych: Mood is stable, no new changes  All other systems were reviewed with the patient and are negative.  MEDICAL HISTORY:  Past Medical History:  Diagnosis Date  . Allergic rhinitis   . Anemia, iron deficiency 05/03/2011   2/2 GI AVMs - recieves iron infusions as well as periodic red blood cell transfusions  .  Anxiety   . Arteriovenous malformation of duodenum 02/25/2015  . CARPAL TUNNEL RELEASE, HX OF 07/22/2008  . Colon polyps   . Diabetes mellitus without complication ([place])    type 2  . Difficulty sleeping    takes trazadone for sleep  . Diverticulosis   . GERD (gastroesophageal reflux disease)   . Hepatitis C    in SVR with Harvoni 12/2014-02/2015.   Marland Kitchen History of hernia repair 08/18/2012  . History of porphyria 11/30/2014   Porphyria cutanea tarda in setting of chronic Hepatitis C, frequent  IV iron infusions, and alcohol abuse   . Hypertension   . Peripheral vascular disease ([place])   . Stroke [facility]) 2010   no residual deficits  . Substance use disorder    cocaine  . Tachycardia 07/10/2017  . Trichomonas vaginitis 04/16/2019    SURGICAL HISTORY: Past Surgical History:  Procedure Laterality Date  . ABDOMINAL HYSTERECTOMY    . APPLICATION OF WOUND VAC N/A 06/21/2014   Procedure: APPLICATION OF WOUND VAC;  Surgeon: Coralie Keens, MD;  Location: [place];  Service: General;  Laterality: N/A;  . CARPAL TUNNEL RELEASE     rt hand  . COLONOSCOPY WITH PROPOFOL N/A 03/17/2018   Procedure: COLONOSCOPY WITH PROPOFOL;  Surgeon: Doran Stabler, MD;  Location: [facility];  Service: Gastroenterology;  Laterality: N/A;  . ENTEROSCOPY N/A 12/04/2012   Procedure: ENTEROSCOPY;  Surgeon: Beryle Beams, MD;  Location: [facility];  Service: Endoscopy;  Laterality: N/A;  . ENTEROSCOPY N/A 12/18/2012   Procedure: ENTEROSCOPY;  Surgeon: Beryle Beams, MD;  [facility];  Service: Endoscopy;  Laterality:  N/A;  . ENTEROSCOPY N/A 02/25/2015   Procedure: ENTEROSCOPY;  Surgeon: Inda Castle, MD;  Location: [place];  Service: Endoscopy;  Laterality: N/A;  . ENTEROSCOPY N/A 10/29/2017   Procedure: ENTEROSCOPY;  Surgeon: Doran Stabler, MD;  [facility];  Service: Gastroenterology;  Laterality: N/A;  . ESOPHAGOGASTRODUODENOSCOPY  05/05/2011   Procedure: ESOPHAGOGASTRODUODENOSCOPY  (EGD);  Surgeon: Zenovia Jarred, MD;  Location[facility];  Service: Gastroenterology;  Laterality: N/A;  Dr. Hilarie Fredrickson will do procedure for Dr. Benson Norway Saturday.  . ESOPHAGOGASTRODUODENOSCOPY  05/08/2011   Procedure: ESOPHAGOGASTRODUODENOSCOPY (EGD);  Surgeon: Beryle Beams;  [facility];  Service: Endoscopy;  Laterality: N/A;  . ESOPHAGOGASTRODUODENOSCOPY  06/07/2011   Procedure: ESOPHAGOGASTRODUODENOSCOPY (EGD);  Surgeon: Beryle Beams, MD;  Location: [facility];  Service: Endoscopy;  Laterality: N/A;  . ESOPHAGOGASTRODUODENOSCOPY  12/20/2011   Procedure: ESOPHAGOGASTRODUODENOSCOPY (EGD);  Surgeon: Beryle Beams, MD;  Location: [facility];  Service: Endoscopy;  Laterality: N/A;  . EYE SURGERY Bilateral    cataracts  . FLEXIBLE SIGMOIDOSCOPY  12/21/2011   Procedure: FLEXIBLE SIGMOIDOSCOPY;  Surgeon: Beryle Beams, MD;  [facility];  Service: Endoscopy;  Laterality: N/A;  . HOT HEMOSTASIS  06/07/2011   Procedure: HOT HEMOSTASIS (ARGON PLASMA COAGULATION/BICAP);  Surgeon: Beryle Beams, MD;  Location: [facility];  Service: Endoscopy;  Laterality: N/A;  . INCISIONAL HERNIA REPAIR N/A 06/01/2014   Procedure: ATTEMPTED LAPAROSCOPIC AND OPEN INCISIONAL HERNIA REPAIR WITH MESH;  Surgeon: Coralie Keens, MD;  Location: [place];  Service: General;  Laterality: N/A;  . INSERTION OF MESH N/A 06/01/2014   Procedure: INSERTION OF MESH;  Surgeon: Coralie Keens, MD;  Location: [place];  Service: General;  Laterality: N/A;  . LAPAROTOMY  02/16/2012   Procedure: EXPLORATORY LAPAROTOMY;  Surgeon: Edward Jolly, MD;  [facility];  Service: General;  Laterality: N/A;  oversewing of anastomotic leak and rigid sigmoidoscopy  . LAPAROTOMY N/A 06/21/2014   Procedure: ABDOMINAL WOUND EXPLORATION;  Surgeon: Coralie Keens, MD;  Location: [place];  Service: General;  Laterality: N/A;  . LEG AMPUTATION ABOVE KNEE     left  . PARTIAL COLECTOMY  02/15/2012   Procedure: PARTIAL COLECTOMY;   Surgeon: Harl Bowie, MD;  [facility]: [facility];  Service: General;  Laterality: N/A;  . TONSILLECTOMY      I have reviewed the social history and family history with the patient and they are unchanged from previous note.  ALLERGIES:  is allergic to ciprofloxacin, morphine and related, penicillins, codeine, lisinopril, and feraheme [ferumoxytol].  MEDICATIONS:  Current Outpatient Medications  Medication Sig Dispense Refill  . ACCU-CHEK FASTCLIX LANCETS MISC Use to test blood glucose 3 times daily. 100 each 11  . allopurinol (ZYLOPRIM) 100 MG tablet TAKE 1 TABLET (100 MG TOTAL) BY MOUTH DAILY. 30 tablet 5  . amLODipine (NORVASC) 10 MG tablet Take 1 tablet (10 mg total) by mouth daily. 90 tablet 3  . aspirin EC 81 MG tablet Take 1 tablet (81 mg total) by mouth daily. 30 tablet 1  . Blood Glucose Monitoring Suppl (ACCU-CHEK GUIDE) w/Device KIT 1 each by Does not apply route 3 (three) times daily. The patient is insulin requiring, ICD 10 code E11.65. The patient tests 3 times a day. 1 kit 1  . cetirizine (ZYRTEC) 10 MG tablet Take 1 tablet (10 mg total) by mouth daily. 30 tablet 0  . cholecalciferol (VITAMIN D) 1000 units tablet Take 1 tablet (1,000 Units total) by mouth daily. 100 tablet 2  .  colchicine 0.6 MG tablet TAKE 1 TABLET (0.6 MG TOTAL) BY MOUTH DAILY. 30 tablet 1  . diphenhydramine-acetaminophen (TYLENOL PM) 25-500 MG TABS tablet Take 1 tablet by mouth at bedtime as needed (sleep).    . Dulaglutide (TRULICITY) 5.69 VX/4.8AX SOPN Inject 0.5 mLs (0.75 mg total) into the skin once a week. 6 mL 6  . feeding supplement, GLUCERNA SHAKE, (GLUCERNA SHAKE) LIQD Take 237 mLs by mouth 3 (three) times daily between meals. (Patient taking differently: Take 237 mLs by mouth 2 (two) times daily between meals. ) 90 Can 0  . gabapentin (NEURONTIN) 400 MG capsule Take 1-2 capsules (400-800 mg total) by mouth See admin instructions. Take 1 capsule (425m) in the morning, 1 capsule (4040m in the  afternoon and 2  Capsules (8009mbefore bedtime 180 capsule 3  . glucose blood (ACCU-CHEK GUIDE) test strip Use as to check blood sugar 3 times daily 300 strip 1  . Insulin Pen Needle (UNIFINE PENTIPS) 32G X 4 MM MISC Use to inject insulin once a day. The patient is insulin requiring, ICD 10 code E11.9. The patient injects 1 times per day. 100 each 2  . losartan (COZAAR) 100 MG tablet Take 1 tablet (100 mg total) by mouth daily. 90 tablet 3  . metFORMIN (GLUCOPHAGE) 1000 MG tablet TAKE 1 TABLET BY MOUTH TWICE A DAY WITH A MEAL (Patient taking differently: Take 1,000 mg by mouth 2 (two) times daily with a meal. TAKE 1 TABLET BY MOUTH TWICE A DAY WITH A MEAL) 180 tablet 1  . omeprazole (PRILOSEC) 20 MG capsule TAKE 1 CAPSULE BY MOUTH EVERY DAY 30 capsule 0  . oxyCODONE-acetaminophen (PERCOCET/ROXICET) 5-325 MG tablet Take 1 tablet by mouth every 8 (eight) hours as needed for severe pain. 10 tablet 0  . traZODone (DESYREL) 50 MG tablet Take 1 tablet (50 mg total) by mouth at bedtime as needed for sleep. 30 tablet 0  . vitamin C (ASCORBIC ACID) 500 MG tablet Take 500 mg by mouth every morning.     No current facility-administered medications for this visit.    PHYSICAL EXAMINATION: ECOG PERFORMANCE STATUS: 3 - Symptomatic, >50% confined to bed  Vitals:   04/06/20 1052  BP: (!) 144/84  Pulse: 78  Resp: 18  Temp: (!) 95.2 F (35.1 C)  SpO2: 100%   Filed Weights   04/06/20 1052  Weight: 167 lb 1.6 oz (75.8 kg)    Due to COVID19 we will limit examination to appearance. Patient had no complaints.  GENERAL:alert, no distress and comfortable SKIN: skin color normal, no rashes or significant lesions EYES: normal, Conjunctiva are pink and non-injected, sclera clear  NEURO: alert & oriented x 3 with fluent speech   LABORATORY DATA:  I have reviewed the data as listed CBC Latest Ref Rng & Units 04/06/2020 03/23/2020 03/11/2020  WBC 4.0 - 10.5 K/uL 8.3 8.0 8.3  Hemoglobin 12.0 - 15.0 g/dL  10.9(L) 10.4(L) 9.4(L)  Hematocrit 36 - 46 % 34.3(L) 32.9(L) 30.2(L)  Platelets 150 - 400 K/uL 377 278 370     CMP Latest Ref Rng & Units 04/06/2020 03/23/2020 03/11/2020  Glucose 70 - 99 mg/dL 127(H) 133(H) 103(H)  BUN 8 - 23 mg/dL _0 Creatinine 0.44 - 1.00 mg/dL 1.18(H) 1.14(H) 1.23(H)  Sodium 135 - 145 mmol/L 142 143 142  Potassium 3.5 - 5.1 mmol/L 3.7 3.3(L) 3.6  Chloride 98 - 111 mmol/L 103 106 109  CO2 22 - 32 mmol/L _1 Calcium 8.9 -  10.3 mg/dL 8.9 8.6(L) 8.0(L)  Total Protein 6.5 - 8.1 g/dL 6.7 6.3(L) 6.4(L)  Total Bilirubin 0.3 - 1.2 mg/dL 0.3 <0.2(L) <0.2(L)  Alkaline Phos 38 - 126 U/L 70 63 67  AST 15 - 41 U/L _0 ALT 0 - 44 U/L _1 RADIOGRAPHIC STUDIES: I have personally reviewed the radiological images as listed and agreed with the findings in the report. No results found.   ASSESSMENT & PLAN:  [name] is a 69 y.o. female with    1. Iron deficiency anemia secondary to GIbloodloss,andAnemia of Chronic Disease  -History of multiple GI bleeding in 2016, required blood transfusion, hospitalization and IV Feraheme -She had an EGD and colonoscopy on 10/29/2017 and 03/17/2018 with Dr. Loletha Carrow, which was normal.  -She endoscopy in [facility] and was found to have AVM in small bowel.  -She has been on oral iron for 5-6 years now and has had intermittent black watery stool.  -She has been receiving IV Feraheme every1-29monthon average lately.Goal is to keep her Hg >10and ferritin>100. Her last IV Feraheme was10/15/21 -Labs reviewed, Hg 10.9, BG 127, Cr 1.18, Albumin 3.3. Iron panel and AFP still pending. Will likely not need Feraheme this week. She has recently required IV iron about every 2 months.  -Will monitor with lab every 3 weeks for 3 months. If improved, will change to every 4 weeks.  -F/u in 12 weeks   2. Porphyria cutaneoustarda (PCT), resolvedafter Hep C Treatment  3.Chronic hepatitis C infection -She tested  positive for hepatitis C in 2009. She has previously completed treatment for hepatitis C in 2016 and repeated test was negative.  -She had an EGD and colonoscopy on 10/29/2017 and 03/17/2018 with Dr. DLoletha Carrow which was normal. SDorothyann Gibbscontinue to follow up with him. -05/2018 liver [place] unremark[place].  -She gets 2 flu shots a year (March and October) and she received her COVID booster in 02/2020.   4. DM2 -Management per PCP's office.  5. LeftAKA, hasprosthesis, she is wheelchair bound most of time, Right LE Edema  -LE edema could be related to nutritional deficiencies or decreased venous return. I encouraged her to elevate her right leg.  -On 12/30/19 she had a mechanical fall,resulting insignificant tenderness in the left elbow and left stump at amputation above knee. There were no fractures on her 01/01/20 Xray. -For pain she has been on Oxycodone q6hours. Her left shoulder pain has improved but her left stump pain mostly stable.  -She notes she has been using crutches more lately and is working towards walking independently.   6. Smoking cessation  -She is still actively smoking,she isnot ready to quit, however, she knows that she needs to quit  Plan -Lab reviewed, iron study is still pending, no need for Feraheme today  -Lab every 3 weeks X4, will set up IV Feraheme if ferritin less than 100 -Follow-up in 12 weeks   No problem-specific Assessment & Plan notes found for this encounter.   No orders of the defined types were placed in this encounter.  All questions were answered. The patient knows to call the clinic with any problems, questions or concerns. No barriers to learning was detected. The total time spent in the appointment was 20 minutes.     YTruitt Merle MD 04/06/2020   I, AJoslyn Devon am acting as scribe for YTruitt Merle MD.   I have reviewed the above documentation for accuracy and completeness, and I agree  with the above.

## 2020-04-02 DIAGNOSIS — G933 Postviral fatigue syndrome: Secondary | ICD-10-CM | POA: Diagnosis not present

## 2020-04-06 ENCOUNTER — Other Ambulatory Visit: Payer: Self-pay

## 2020-04-06 ENCOUNTER — Encounter: Payer: Self-pay | Admitting: Hematology

## 2020-04-06 ENCOUNTER — Inpatient Hospital Stay: Payer: Medicare Other | Attending: Nurse Practitioner

## 2020-04-06 ENCOUNTER — Inpatient Hospital Stay (HOSPITAL_BASED_OUTPATIENT_CLINIC_OR_DEPARTMENT_OTHER): Payer: Medicare Other | Admitting: Hematology

## 2020-04-06 VITALS — BP 144/84 | HR 78 | Temp 95.2°F | Resp 18 | Ht 62.0 in | Wt 167.1 lb

## 2020-04-06 DIAGNOSIS — I739 Peripheral vascular disease, unspecified: Secondary | ICD-10-CM | POA: Insufficient documentation

## 2020-04-06 DIAGNOSIS — Z86010 Personal history of colonic polyps: Secondary | ICD-10-CM | POA: Insufficient documentation

## 2020-04-06 DIAGNOSIS — B182 Chronic viral hepatitis C: Secondary | ICD-10-CM | POA: Diagnosis not present

## 2020-04-06 DIAGNOSIS — F419 Anxiety disorder, unspecified: Secondary | ICD-10-CM | POA: Diagnosis not present

## 2020-04-06 DIAGNOSIS — D500 Iron deficiency anemia secondary to blood loss (chronic): Secondary | ICD-10-CM | POA: Diagnosis not present

## 2020-04-06 DIAGNOSIS — Z794 Long term (current) use of insulin: Secondary | ICD-10-CM | POA: Diagnosis not present

## 2020-04-06 DIAGNOSIS — Z79899 Other long term (current) drug therapy: Secondary | ICD-10-CM | POA: Insufficient documentation

## 2020-04-06 DIAGNOSIS — Z8619 Personal history of other infectious and parasitic diseases: Secondary | ICD-10-CM | POA: Diagnosis not present

## 2020-04-06 DIAGNOSIS — K219 Gastro-esophageal reflux disease without esophagitis: Secondary | ICD-10-CM | POA: Insufficient documentation

## 2020-04-06 DIAGNOSIS — E119 Type 2 diabetes mellitus without complications: Secondary | ICD-10-CM | POA: Diagnosis not present

## 2020-04-06 DIAGNOSIS — Z8673 Personal history of transient ischemic attack (TIA), and cerebral infarction without residual deficits: Secondary | ICD-10-CM | POA: Insufficient documentation

## 2020-04-06 DIAGNOSIS — F17210 Nicotine dependence, cigarettes, uncomplicated: Secondary | ICD-10-CM | POA: Diagnosis not present

## 2020-04-06 DIAGNOSIS — I10 Essential (primary) hypertension: Secondary | ICD-10-CM | POA: Diagnosis not present

## 2020-04-06 DIAGNOSIS — Z7982 Long term (current) use of aspirin: Secondary | ICD-10-CM | POA: Insufficient documentation

## 2020-04-06 LAB — FERRITIN: Ferritin: 46 ng/mL (ref 11–307)

## 2020-04-06 LAB — CMP (CANCER CENTER ONLY)
ALT: 10 U/L (ref 0–44)
AST: 17 U/L (ref 15–41)
Albumin: 3.3 g/dL — ABNORMAL LOW (ref 3.5–5.0)
Alkaline Phosphatase: 70 U/L (ref 38–126)
Anion gap: 10 (ref 5–15)
BUN: 14 mg/dL (ref 8–23)
CO2: 29 mmol/L (ref 22–32)
Calcium: 8.9 mg/dL (ref 8.9–10.3)
Chloride: 103 mmol/L (ref 98–111)
Creatinine: 1.18 mg/dL — ABNORMAL HIGH (ref 0.44–1.00)
GFR, Estimated: 51 mL/min — ABNORMAL LOW (ref >60)
Glucose, Bld: 127 mg/dL — ABNORMAL HIGH (ref 70–99)
Potassium: 3.7 mmol/L (ref 3.5–5.1)
Sodium: 142 mmol/L (ref 135–145)
Total Bilirubin: 0.3 mg/dL (ref 0.3–1.2)
Total Protein: 6.7 g/dL (ref 6.5–8.1)

## 2020-04-06 LAB — CBC WITH DIFFERENTIAL (CANCER CENTER ONLY)
Abs Immature Granulocytes: 0.04 10*3/uL (ref 0.00–0.07)
Basophils Absolute: 0 10*3/uL (ref 0.0–0.1)
Basophils Relative: 1 %
Eosinophils Absolute: 0.1 10*3/uL (ref 0.0–0.5)
Eosinophils Relative: 2 %
HCT: 34.3 % — ABNORMAL LOW (ref 36.0–46.0)
Hemoglobin: 10.9 g/dL — ABNORMAL LOW (ref 12.0–15.0)
Immature Granulocytes: 1 %
Lymphocytes Relative: 14 %
Lymphs Abs: 1.2 10*3/uL (ref 0.7–4.0)
MCH: 29.9 pg (ref 26.0–34.0)
MCHC: 31.8 g/dL (ref 30.0–36.0)
MCV: 94.2 fL (ref 80.0–100.0)
Monocytes Absolute: 0.8 10*3/uL (ref 0.1–1.0)
Monocytes Relative: 10 %
Neutro Abs: 6.1 10*3/uL (ref 1.7–7.7)
Neutrophils Relative %: 72 %
Platelet Count: 377 10*3/uL (ref 150–400)
RBC: 3.64 MIL/uL — ABNORMAL LOW (ref 3.87–5.11)
RDW: 18.2 % — ABNORMAL HIGH (ref 11.5–15.5)
WBC Count: 8.3 10*3/uL (ref 4.0–10.5)
nRBC: 0 % (ref 0.0–0.2)

## 2020-04-06 LAB — IRON AND TIBC
Iron: 33 ug/dL — ABNORMAL LOW (ref 41–142)
Saturation Ratios: 10 % — ABNORMAL LOW (ref 21–57)
TIBC: 344 ug/dL (ref 236–444)
UIBC: 310 ug/dL (ref 120–384)

## 2020-04-07 LAB — AFP TUMOR MARKER: AFP, Serum, Tumor Marker: 3.2 ng/mL (ref 0.0–8.3)

## 2020-04-14 ENCOUNTER — Other Ambulatory Visit: Payer: Self-pay | Admitting: Internal Medicine

## 2020-04-14 DIAGNOSIS — E118 Type 2 diabetes mellitus with unspecified complications: Secondary | ICD-10-CM

## 2020-04-14 DIAGNOSIS — Z794 Long term (current) use of insulin: Secondary | ICD-10-CM

## 2020-04-14 DIAGNOSIS — I10 Essential (primary) hypertension: Secondary | ICD-10-CM

## 2020-04-14 NOTE — Telephone Encounter (Signed)
Next appt scheduled 11/22 with PCP.

## 2020-04-14 NOTE — Telephone Encounter (Signed)
It appears that patient is no longer supposed to be taking dilt or chlorthalidone according to Dr. Justus Memory last note. Can be addressed at visit with PCP on 11/22 if patient has questions.

## 2020-04-18 ENCOUNTER — Ambulatory Visit (INDEPENDENT_AMBULATORY_CARE_PROVIDER_SITE_OTHER): Payer: Medicare Other | Admitting: Internal Medicine

## 2020-04-18 ENCOUNTER — Encounter: Payer: Self-pay | Admitting: Internal Medicine

## 2020-04-18 ENCOUNTER — Other Ambulatory Visit: Payer: Self-pay

## 2020-04-18 VITALS — BP 140/89 | HR 89 | Temp 98.9°F | Ht 62.0 in | Wt 166.3 lb

## 2020-04-18 DIAGNOSIS — M1A9XX0 Chronic gout, unspecified, without tophus (tophi): Secondary | ICD-10-CM | POA: Diagnosis not present

## 2020-04-18 DIAGNOSIS — K31819 Angiodysplasia of stomach and duodenum without bleeding: Secondary | ICD-10-CM | POA: Diagnosis not present

## 2020-04-18 DIAGNOSIS — S36029S Unspecified contusion of spleen, sequela: Secondary | ICD-10-CM

## 2020-04-18 DIAGNOSIS — E1140 Type 2 diabetes mellitus with diabetic neuropathy, unspecified: Secondary | ICD-10-CM

## 2020-04-18 DIAGNOSIS — I10 Essential (primary) hypertension: Secondary | ICD-10-CM | POA: Diagnosis not present

## 2020-04-18 MED ORDER — OMEPRAZOLE 20 MG PO CPDR
20.0000 mg | DELAYED_RELEASE_CAPSULE | Freq: Every day | ORAL | 1 refills | Status: DC
Start: 2020-04-18 — End: 2020-10-19

## 2020-04-18 NOTE — Assessment & Plan Note (Signed)
BP: 140/89   [name] states that she is taking her amlodipine and losartan daily without any difficulty. She denies any dizziness, chest pain, difficulty breathing.  Assessment/plan: Blood pressure initially elevated at the beginning of the visit, however returned closer to goal. No medication changes made today.  -Continue amlodipine 10 mg daily, losartan 100 mg daily

## 2020-04-18 NOTE — Patient Instructions (Addendum)
It was nice seeing you today! Thank you for choosing [facility] for your Primary Care.    Today we talked about:   1. At your next appointment, bring your glucometer. If you are not already, start checking your sugar at least once per day.   2. Do not restart Zyloprim daily   3. When you are having dark stools, please go to the ED if you develop any dizziness or heart racing.   4. I ordered a cat scan of your belly to follow up previous findings.

## 2020-04-18 NOTE — Progress Notes (Addendum)
   CC: HTN  HPI:  [name] is a 69 y.o. with a PMHx as listed below who presents to the clinic for HTN.   Please see the Encounters tab for problem-based Assessment & Plan regarding status of patient's acute and chronic conditions.  Past Medical History:  Diagnosis Date  . Allergic rhinitis   . Anemia, iron deficiency 05/03/2011   2/2 GI AVMs - recieves iron infusions as well as periodic red blood cell transfusions  . Anxiety   . Arteriovenous malformation of duodenum 02/25/2015  . CARPAL TUNNEL RELEASE, HX OF 07/22/2008  . Chest pain 10/28/2019  . Colon polyps   . Diabetes mellitus without complication ([place])    type 2  . Difficulty sleeping    takes trazadone for sleep  . Diverticulosis   . Fall 01/03/2020  . GERD (gastroesophageal reflux disease)   . Hepatitis C    in SVR with Harvoni 12/2014-02/2015.   Marland Kitchen History of hernia repair 08/18/2012  . History of porphyria 11/30/2014   Porphyria cutanea tarda in setting of chronic Hepatitis C, frequent  IV iron infusions, and alcohol abuse   . Hypertension   . Hypocalcemia 10/28/2019  . Hypokalemia 09/27/2014  . Peripheral vascular disease ([place])   . Stroke [facility]) 2010   no residual deficits  . Substance use disorder    cocaine  . Tachycardia 07/10/2017  . Trichomonas vaginitis 04/16/2019   Review of Systems: Review of Systems  Constitutional: Negative for chills, fever, malaise/fatigue and weight loss.  Respiratory: Negative for cough and shortness of breath.   Cardiovascular: Negative for chest pain, palpitations and leg swelling.  Gastrointestinal: Positive for melena. Negative for abdominal pain, diarrhea, nausea and vomiting.  Neurological: Negative for dizziness, focal weakness, weakness and headaches.   Physical Exam:  Vitals:   04/18/20 0931 04/18/20 1014  BP: (!) 147/78 140/89  Pulse: (!) 102 89  Temp: 98.9 F (37.2 C)   TempSrc: Oral   SpO2: 100%   Weight: 166 lb 4.8 oz (75.4 kg)   Height: 5\' 2"  (1.575 m)     Physical Exam Constitutional:      General: She is not in acute distress.    Appearance: She is normal weight.  HENT:     Head: Normocephalic and atraumatic.  Cardiovascular:     Rate and Rhythm: Normal rate and regular rhythm.     Heart sounds: No murmur heard.  No gallop.   Pulmonary:     Effort: Pulmonary effort is normal. No respiratory distress.     Breath sounds: No wheezing, rhonchi or rales.  Abdominal:     General: Bowel sounds are normal.     Palpations: Abdomen is soft.  Musculoskeletal:     Right lower leg: No edema.     Left Lower Extremity: Left leg is amputated above knee.  Skin:    General: Skin is warm and dry.  Neurological:     Mental Status: She is alert and oriented to person, place, and time. Mental status is at baseline.  Psychiatric:        Mood and Affect: Mood normal.        Behavior: Behavior normal.    Assessment & Plan:   See Encounters Tab for problem based charting.  Patient discussed with Dr. Philipp Ovens

## 2020-04-19 NOTE — Assessment & Plan Note (Signed)
Assessment/plan: [name] has a history of a splenic hematoma that was found incidentally back in June 2021. It looks like she had follow-up imaging at one point but it was done too soon. Has now been several months, so patient is due for repeat CT to ensure no changes.  -CT abdomen with contrast ordered

## 2020-04-19 NOTE — Assessment & Plan Note (Signed)
Ms. Lasota states that she has not been taking her allopurinol or colchicine daily but only as needed when she feels like a flare is coming on. She states she usually only has one flare per year and never more so than that. She denies any tophaceous gout.  Assessment/plan: No current indication for chronic management and will counsel patient to not resume daily allopurinol. I counseled that she should contact the clinic should she feel a flare coming on.   -Continue colchicine as needed for flares.

## 2020-04-19 NOTE — Assessment & Plan Note (Signed)
Lab Results  Component Value Date   HGBA1C 4.4 02/08/2020   Ms. Wendt states that she continues to regularly check her sugars, notes on average around 140-150. She did not bring her glucometer with her today. She will do so at the next appointment. She states she continues to take Trulicity once a week and Metformin daily without any difficulty.  Assessment/plan: A1c has always been on the lower end, however I suspect this is falsely low due to her chronic blood loss anemia. Her average sugars at home is consistent with a well-controlled diabetes though. I did ask her to bring her glucometer with her to her next appointment. No medication changes planned for today.

## 2020-04-19 NOTE — Assessment & Plan Note (Signed)
[name] notes that every once while she continues to have melanotic stools. She notes that they usually occur throughout the day for 1 day at a time and then resolve for a period of time before reoccurring. She denies any abdominal pain. She denies any hematochezia. She has not followed up with GI in a while and so had a referral placed by Dr. Onnie Graham. Unfortunately she had multiple doctors appointments on the same day and missed her GI appointment. She has a new appointment set up on December 21.  Assessment/plan: History of AVMs found in the duodenum on endoscopy. Likely this is the current source of her melena. She gets regular CBCs and her hemoglobin has been stable. I counseled [name] on signs and symptoms of low blood counts and she expressed understanding.  -Recommended following up with GI -Counseled to contact the clinic immediately or go to the ED should she develop any dizziness, palpitations with also having melena

## 2020-04-19 NOTE — Assessment & Plan Note (Signed)
>>  ASSESSMENT AND PLAN FOR TYPE 2 DIABETES MELLITUS WITH DIABETIC NEUROPATHY ([facility]) WRITTEN ON 04/19/2020  5:37 PM BY ARNETT SAUNDERS, MD  Lab Results  Component Value Date   HGBA1C 4.4 02/08/2020   [name] states that she continues to regularly check her sugars, notes on average around 140-150. She did not bring her glucometer with her today. She will do so at the next appointment. She states she continues to take Trulicity  once a week and Metformin  daily without any difficulty.  Assessment/plan: A1c has always been on the lower end, however I suspect this is falsely low due to her chronic blood loss anemia. Her average sugars at home is consistent with a well-controlled diabetes though. I did ask her to bring her glucometer with her to her next appointment. No medication changes planned for today.

## 2020-04-20 ENCOUNTER — Encounter: Payer: Medicare Other | Admitting: Internal Medicine

## 2020-04-25 NOTE — Progress Notes (Signed)
Internal Medicine Clinic Attending  Case discussed with Dr. Basaraba  At the time of the visit.  We reviewed the resident's history and exam and pertinent patient test results.  I agree with the assessment, diagnosis, and plan of care documented in the resident's note.  

## 2020-04-27 ENCOUNTER — Inpatient Hospital Stay: Payer: Medicare Other | Attending: Nurse Practitioner

## 2020-04-27 ENCOUNTER — Other Ambulatory Visit: Payer: Self-pay

## 2020-04-27 DIAGNOSIS — D500 Iron deficiency anemia secondary to blood loss (chronic): Secondary | ICD-10-CM | POA: Diagnosis present

## 2020-04-27 DIAGNOSIS — D509 Iron deficiency anemia, unspecified: Secondary | ICD-10-CM | POA: Insufficient documentation

## 2020-04-27 DIAGNOSIS — Z79899 Other long term (current) drug therapy: Secondary | ICD-10-CM | POA: Insufficient documentation

## 2020-04-27 LAB — COMPREHENSIVE METABOLIC PANEL
ALT: 11 U/L (ref 0–44)
AST: 18 U/L (ref 15–41)
Albumin: 3.4 g/dL — ABNORMAL LOW (ref 3.5–5.0)
Alkaline Phosphatase: 64 U/L (ref 38–126)
Anion gap: 9 (ref 5–15)
BUN: 15 mg/dL (ref 8–23)
CO2: 25 mmol/L (ref 22–32)
Calcium: 8.6 mg/dL — ABNORMAL LOW (ref 8.9–10.3)
Chloride: 106 mmol/L (ref 98–111)
Creatinine, Ser: 1.17 mg/dL — ABNORMAL HIGH (ref 0.44–1.00)
GFR, Estimated: 51 mL/min — ABNORMAL LOW (ref >60)
Glucose, Bld: 105 mg/dL — ABNORMAL HIGH (ref 70–99)
Potassium: 4 mmol/L (ref 3.5–5.1)
Sodium: 140 mmol/L (ref 135–145)
Total Bilirubin: 0.2 mg/dL — ABNORMAL LOW (ref 0.3–1.2)
Total Protein: 6.6 g/dL (ref 6.5–8.1)

## 2020-04-27 LAB — IRON AND TIBC
Iron: 24 ug/dL — ABNORMAL LOW (ref 41–142)
Saturation Ratios: 6 % — ABNORMAL LOW (ref 21–57)
TIBC: 403 ug/dL (ref 236–444)
UIBC: 379 ug/dL (ref 120–384)

## 2020-04-27 LAB — FERRITIN: Ferritin: 14 ng/mL (ref 11–307)

## 2020-05-02 ENCOUNTER — Ambulatory Visit (HOSPITAL_COMMUNITY): Admission: RE | Admit: 2020-05-02 | Payer: Medicare Other | Source: Ambulatory Visit

## 2020-05-02 ENCOUNTER — Telehealth: Payer: Self-pay | Admitting: *Deleted

## 2020-05-02 ENCOUNTER — Telehealth: Payer: Self-pay | Admitting: Nurse Practitioner

## 2020-05-02 NOTE — Telephone Encounter (Signed)
Per Hendricks Limes, called pt with message below. Pt verbalized understanding. Advised to call with any other questions and concerns.

## 2020-05-02 NOTE — Telephone Encounter (Signed)
-----   Message from Alla Feeling, NP sent at 05/02/2020  8:23 AM EST ----- Please let her know ferritin is low and we recommend IV Feraheme weekly x2, our scheduler will call her. I sent message.   Thanks, Regan Rakers, NP

## 2020-05-02 NOTE — Telephone Encounter (Signed)
Scheduled appt per 12/6 sch msg - pt is aware of appt date and time

## 2020-05-06 ENCOUNTER — Inpatient Hospital Stay: Payer: Medicare Other

## 2020-05-06 ENCOUNTER — Other Ambulatory Visit: Payer: Self-pay

## 2020-05-06 VITALS — BP 124/75 | HR 81 | Temp 98.7°F | Resp 20

## 2020-05-06 DIAGNOSIS — D509 Iron deficiency anemia, unspecified: Secondary | ICD-10-CM | POA: Diagnosis not present

## 2020-05-06 DIAGNOSIS — K922 Gastrointestinal hemorrhage, unspecified: Secondary | ICD-10-CM

## 2020-05-06 DIAGNOSIS — D500 Iron deficiency anemia secondary to blood loss (chronic): Secondary | ICD-10-CM

## 2020-05-06 MED ORDER — DIPHENHYDRAMINE HCL 50 MG/ML IJ SOLN
50.0000 mg | Freq: Once | INTRAMUSCULAR | Status: AC
  Administered 2020-05-06: 50 mg via INTRAVENOUS

## 2020-05-06 MED ORDER — FAMOTIDINE 20 MG PO TABS
ORAL_TABLET | ORAL | Status: AC
  Filled 2020-05-06: qty 1

## 2020-05-06 MED ORDER — SODIUM CHLORIDE 0.9 % IV SOLN
INTRAVENOUS | Status: DC
  Filled 2020-05-06: qty 250

## 2020-05-06 MED ORDER — DIPHENHYDRAMINE HCL 50 MG/ML IJ SOLN
INTRAMUSCULAR | Status: AC
  Filled 2020-05-06: qty 1

## 2020-05-06 MED ORDER — SODIUM CHLORIDE 0.9 % IV SOLN
510.0000 mg | Freq: Once | INTRAVENOUS | Status: AC
  Administered 2020-05-06: 510 mg via INTRAVENOUS
  Filled 2020-05-06: qty 510

## 2020-05-06 MED ORDER — FAMOTIDINE 20 MG PO TABS
20.0000 mg | ORAL_TABLET | Freq: Once | ORAL | Status: AC
  Administered 2020-05-06: 20 mg via ORAL

## 2020-05-06 NOTE — Patient Instructions (Signed)

## 2020-05-10 ENCOUNTER — Other Ambulatory Visit: Payer: Self-pay | Admitting: Internal Medicine

## 2020-05-10 DIAGNOSIS — I10 Essential (primary) hypertension: Secondary | ICD-10-CM

## 2020-05-13 ENCOUNTER — Other Ambulatory Visit: Payer: Self-pay

## 2020-05-13 ENCOUNTER — Inpatient Hospital Stay: Payer: Medicare Other

## 2020-05-13 VITALS — BP 142/80 | HR 80 | Temp 98.5°F | Resp 18

## 2020-05-13 DIAGNOSIS — K922 Gastrointestinal hemorrhage, unspecified: Secondary | ICD-10-CM

## 2020-05-13 DIAGNOSIS — D509 Iron deficiency anemia, unspecified: Secondary | ICD-10-CM | POA: Diagnosis not present

## 2020-05-13 DIAGNOSIS — D500 Iron deficiency anemia secondary to blood loss (chronic): Secondary | ICD-10-CM

## 2020-05-13 MED ORDER — DIPHENHYDRAMINE HCL 50 MG/ML IJ SOLN
INTRAMUSCULAR | Status: AC
  Filled 2020-05-13: qty 1

## 2020-05-13 MED ORDER — SODIUM CHLORIDE 0.9 % IV SOLN
Freq: Once | INTRAVENOUS | Status: AC
  Filled 2020-05-13: qty 250

## 2020-05-13 MED ORDER — SODIUM CHLORIDE 0.9 % IV SOLN
510.0000 mg | Freq: Once | INTRAVENOUS | Status: AC
  Administered 2020-05-13: 510 mg via INTRAVENOUS
  Filled 2020-05-13: qty 510

## 2020-05-13 MED ORDER — DIPHENHYDRAMINE HCL 50 MG/ML IJ SOLN
50.0000 mg | Freq: Once | INTRAMUSCULAR | Status: AC
  Administered 2020-05-13: 50 mg via INTRAVENOUS

## 2020-05-13 MED ORDER — FAMOTIDINE 20 MG PO TABS
ORAL_TABLET | ORAL | Status: AC
  Filled 2020-05-13: qty 1

## 2020-05-13 MED ORDER — FAMOTIDINE 20 MG PO TABS
20.0000 mg | ORAL_TABLET | Freq: Once | ORAL | Status: AC
Start: 2020-05-13 — End: 2020-05-13
  Administered 2020-05-13: 20 mg via ORAL

## 2020-05-13 NOTE — Patient Instructions (Signed)

## 2020-05-16 ENCOUNTER — Other Ambulatory Visit: Payer: Self-pay

## 2020-05-16 ENCOUNTER — Inpatient Hospital Stay: Payer: Medicare Other

## 2020-05-16 DIAGNOSIS — D500 Iron deficiency anemia secondary to blood loss (chronic): Secondary | ICD-10-CM

## 2020-05-16 DIAGNOSIS — D509 Iron deficiency anemia, unspecified: Secondary | ICD-10-CM | POA: Diagnosis not present

## 2020-05-16 LAB — CBC (CANCER CENTER ONLY)
HCT: 31.5 % — ABNORMAL LOW (ref 36.0–46.0)
Hemoglobin: 9.9 g/dL — ABNORMAL LOW (ref 12.0–15.0)
MCH: 29.2 pg (ref 26.0–34.0)
MCHC: 31.4 g/dL (ref 30.0–36.0)
MCV: 92.9 fL (ref 80.0–100.0)
Platelet Count: 308 10*3/uL (ref 150–400)
RBC: 3.39 MIL/uL — ABNORMAL LOW (ref 3.87–5.11)
RDW: 20.7 % — ABNORMAL HIGH (ref 11.5–15.5)
WBC Count: 6.7 10*3/uL (ref 4.0–10.5)
nRBC: 0 % (ref 0.0–0.2)

## 2020-05-16 LAB — FERRITIN: Ferritin: 407 ng/mL — ABNORMAL HIGH (ref 11–307)

## 2020-05-16 NOTE — Progress Notes (Signed)
Blood draw requested missing lab order

## 2020-06-08 ENCOUNTER — Other Ambulatory Visit: Payer: Self-pay

## 2020-06-08 ENCOUNTER — Inpatient Hospital Stay: Payer: Medicare Other | Attending: Nurse Practitioner

## 2020-06-08 DIAGNOSIS — Z79899 Other long term (current) drug therapy: Secondary | ICD-10-CM | POA: Insufficient documentation

## 2020-06-08 DIAGNOSIS — D500 Iron deficiency anemia secondary to blood loss (chronic): Secondary | ICD-10-CM

## 2020-06-08 LAB — CBC WITH DIFFERENTIAL (CANCER CENTER ONLY)
Abs Immature Granulocytes: 0.09 10*3/uL — ABNORMAL HIGH (ref 0.00–0.07)
Basophils Absolute: 0.1 10*3/uL (ref 0.0–0.1)
Basophils Relative: 1 %
Eosinophils Absolute: 0.2 10*3/uL (ref 0.0–0.5)
Eosinophils Relative: 3 %
HCT: 31.3 % — ABNORMAL LOW (ref 36.0–46.0)
Hemoglobin: 10.2 g/dL — ABNORMAL LOW (ref 12.0–15.0)
Immature Granulocytes: 1 %
Lymphocytes Relative: 16 %
Lymphs Abs: 1.2 10*3/uL (ref 0.7–4.0)
MCH: 30.6 pg (ref 26.0–34.0)
MCHC: 32.6 g/dL (ref 30.0–36.0)
MCV: 94 fL (ref 80.0–100.0)
Monocytes Absolute: 0.9 10*3/uL (ref 0.1–1.0)
Monocytes Relative: 13 %
Neutro Abs: 4.7 10*3/uL (ref 1.7–7.7)
Neutrophils Relative %: 66 %
Platelet Count: 389 10*3/uL (ref 150–400)
RBC: 3.33 MIL/uL — ABNORMAL LOW (ref 3.87–5.11)
RDW: 18.3 % — ABNORMAL HIGH (ref 11.5–15.5)
WBC Count: 7.1 10*3/uL (ref 4.0–10.5)
nRBC: 0 % (ref 0.0–0.2)

## 2020-06-08 LAB — IRON AND TIBC
Iron: 44 ug/dL (ref 41–142)
Saturation Ratios: 13 % — ABNORMAL LOW (ref 21–57)
TIBC: 333 ug/dL (ref 236–444)
UIBC: 289 ug/dL (ref 120–384)

## 2020-06-08 LAB — FERRITIN: Ferritin: 50 ng/mL (ref 11–307)

## 2020-06-13 ENCOUNTER — Telehealth: Payer: Self-pay | Admitting: *Deleted

## 2020-06-13 NOTE — Telephone Encounter (Signed)
Notified of message below

## 2020-06-13 NOTE — Telephone Encounter (Signed)
-----   Message from Truitt Merle, MD sent at 06/12/2020 12:30 PM EST ----- Please let pt know her iron level is slightly low, please add iv iron once on her next visit, thanks   Truitt Merle  06/12/2020

## 2020-06-14 MED FILL — TRULICITY 0.75 MG/0.5 ML PE: 0.75 | 84 days supply | Qty: 6 | Fill #0

## 2020-06-15 ENCOUNTER — Inpatient Hospital Stay: Payer: Medicare Other

## 2020-06-15 ENCOUNTER — Other Ambulatory Visit: Payer: Self-pay

## 2020-06-15 VITALS — BP 133/64 | HR 77 | Temp 98.0°F | Resp 18

## 2020-06-15 DIAGNOSIS — D500 Iron deficiency anemia secondary to blood loss (chronic): Secondary | ICD-10-CM

## 2020-06-15 DIAGNOSIS — K922 Gastrointestinal hemorrhage, unspecified: Secondary | ICD-10-CM

## 2020-06-15 MED ORDER — FAMOTIDINE 20 MG PO TABS
ORAL_TABLET | ORAL | Status: AC
  Filled 2020-06-15: qty 1

## 2020-06-15 MED ORDER — SODIUM CHLORIDE 0.9 % IV SOLN
510.0000 mg | Freq: Once | INTRAVENOUS | Status: AC
  Administered 2020-06-15: 510 mg via INTRAVENOUS
  Filled 2020-06-15: qty 510

## 2020-06-15 MED ORDER — FAMOTIDINE 20 MG PO TABS
20.0000 mg | ORAL_TABLET | Freq: Once | ORAL | Status: AC
  Administered 2020-06-15: 20 mg via ORAL

## 2020-06-15 MED ORDER — DIPHENHYDRAMINE HCL 50 MG/ML IJ SOLN
50.0000 mg | Freq: Once | INTRAMUSCULAR | Status: AC
  Administered 2020-06-15: 50 mg via INTRAVENOUS

## 2020-06-15 MED ORDER — SODIUM CHLORIDE 0.9 % IV SOLN
Freq: Once | INTRAVENOUS | Status: AC
  Filled 2020-06-15: qty 250

## 2020-06-15 MED ORDER — DIPHENHYDRAMINE HCL 50 MG/ML IJ SOLN
INTRAMUSCULAR | Status: AC
  Filled 2020-06-15: qty 1

## 2020-06-15 NOTE — Patient Instructions (Signed)
Ferumoxytol injection What is this medicine? FERUMOXYTOL is an iron complex. Iron is used to make healthy red blood cells, which carry oxygen and nutrients throughout the body. This medicine is used to treat iron deficiency anemia. This medicine may be used for other purposes; ask your health care provider or pharmacist if you have questions. COMMON BRAND NAME(S): Feraheme What should I tell my health care provider before I take this medicine? They need to know if you have any of these conditions:  anemia not caused by low iron levels  high levels of iron in the blood  magnetic resonance imaging (MRI) test scheduled  an unusual or allergic reaction to iron, other medicines, foods, dyes, or preservatives  pregnant or trying to get pregnant  breast-feeding How should I use this medicine? This medicine is for injection into a vein. It is given by a health care professional in a hospital or clinic setting. Talk to your pediatrician regarding the use of this medicine in children. Special care may be needed. Overdosage: If you think you have taken too much of this medicine contact a poison control center or emergency room at once. NOTE: This medicine is only for you. Do not share this medicine with others. What if I miss a dose? It is important not to miss your dose. Call your doctor or health care professional if you are unable to keep an appointment. What may interact with this medicine? This medicine may interact with the following medications:  other iron products This list may not describe all possible interactions. Give your health care provider a list of all the medicines, herbs, non-prescription drugs, or dietary supplements you use. Also tell them if you smoke, drink alcohol, or use illegal drugs. Some items may interact with your medicine. What should I watch for while using this medicine? Visit your doctor or healthcare professional regularly. Tell your doctor or healthcare  professional if your symptoms do not start to get better or if they get worse. You may need blood work done while you are taking this medicine. You may need to follow a special diet. Talk to your doctor. Foods that contain iron include: whole grains/cereals, dried fruits, beans, or peas, leafy green vegetables, and organ meats (liver, kidney). What side effects may I notice from receiving this medicine? Side effects that you should report to your doctor or health care professional as soon as possible:  allergic reactions like skin rash, itching or hives, swelling of the face, lips, or tongue  breathing problems  changes in blood pressure  feeling faint or lightheaded, falls  fever or chills  flushing, sweating, or hot feelings  swelling of the ankles or feet Side effects that usually do not require medical attention (report to your doctor or health care professional if they continue or are bothersome):  diarrhea  headache  nausea, vomiting  stomach pain This list may not describe all possible side effects. Call your doctor for medical advice about side effects. You may report side effects to FDA at 1-800-FDA-1088. Where should I keep my medicine? This drug is given in a hospital or clinic and will not be stored at home. NOTE: This sheet is a summary. It may not cover all possible information. If you have questions about this medicine, talk to your doctor, pharmacist, or health care provider.  2021 Elsevier/Gold Standard (2016-07-02 20:21:10)  

## 2020-06-17 ENCOUNTER — Other Ambulatory Visit: Payer: Self-pay | Admitting: Internal Medicine

## 2020-06-24 NOTE — Progress Notes (Signed)
[place]   Telephone:([phone]) [phone] [phone]) [phone]   Clinic Follow up Note   Patient Care Team: Jose Persia, MD as PCP - Evalina Field, MD as Consulting Physician (Ophthalmology) Coralie Keens, MD as Consulting Physician (General Surgery) Truitt Merle, MD as Consulting Physician (Hematology) Comer, Okey Regal, MD as Consulting Physician (Infectious Diseases)  Date of Service:  06/29/2020  CHIEF COMPLAINT: F/u of Anemia   CURRENT THERAPY:  IV Feraheme as neededif ferritin <100   INTERVAL HISTORY:  [name] is here for a follow up of anemia. She presents to the clinic alone. She notes she has had her COVID booster in 02/2020. She notes her neighbors have [place] and she does not feel safe, but does not have symptoms herself and did not expose herself to them. She is interested in getting COVID testing. She does not have computer to schedule an appointment or request free COVID home tests. I gave her information for contact.  She notes her energy level is fair but overall better lately. She notes she is losing weight. She notes GI bleeding in her stool. She notes she will have 3 BM a day which are black. She has had black stool intermittently for the past 3 months. Her last episode of black stool was 5 days ago.  She notes confusion about why she was referred to Urologist Dr Abner Greenspan. She is emotional in clinic today about having multiple doctor's. She notes she has a caregiver, but does not come to doctor's visits. She declined me calling anyone.    REVIEW OF SYSTEMS:   Constitutional: Denies fevers, chills (+) Fatigue (+)  weight loss Eyes: Denies blurriness of vision Ears, nose, mouth, throat, and face: Denies mucositis or sore throat Respiratory: Denies cough, dyspnea or wheezes Cardiovascular: Denies palpitation, chest discomfort or lower extremity swelling Gastrointestinal:  Denies nausea, heartburn or change in bowel habits (+) Intermittent black  stool  Skin: Denies abnormal skin rashes Lymphatics: Denies new lymphadenopathy or easy bruising Neurological:Denies numbness, tingling or new weaknesses Behavioral/Psych: Mood is stable, no new changes  All other systems were reviewed with the patient and are negative.  MEDICAL HISTORY:  Past Medical History:  Diagnosis Date  . Allergic rhinitis   . Anemia, iron deficiency 05/03/2011   2/2 GI AVMs - recieves iron infusions as well as periodic red blood cell transfusions  . Anxiety   . Arteriovenous malformation of duodenum 02/25/2015  . CARPAL TUNNEL RELEASE, HX OF 07/22/2008  . Chest pain 10/28/2019  . Colon polyps   . Diabetes mellitus without complication ([place])    type 2  . Difficulty sleeping    takes trazadone for sleep  . Diverticulosis   . Fall 01/03/2020  . GERD (gastroesophageal reflux disease)   . Hepatitis C    in SVR with [facility] 12/2014-02/2015.   Marland Kitchen History of hernia repair 08/18/2012  . History of porphyria 11/30/2014   Porphyria cutanea tarda in setting of chronic Hepatitis C, frequent  IV iron infusions, and alcohol abuse   . Hypertension   . Hypocalcemia 10/28/2019  . Hypokalemia 09/27/2014  . Peripheral vascular disease ([place])   . Stroke [facility]) 2010   no residual deficits  . Substance use disorder    cocaine  . Tachycardia 07/10/2017  . Trichomonas vaginitis 04/16/2019    SURGICAL HISTORY: Past Surgical History:  Procedure Laterality Date  . ABDOMINAL HYSTERECTOMY    . APPLICATION OF WOUND VAC N/A 06/21/2014   Procedure: APPLICATION OF WOUND VAC;  Surgeon: Douglas Blackman, MD;  Location: [facility];  Service: General;  Laterality: N/A;  . CARPAL TUNNEL RELEASE     rt hand  . COLONOSCOPY WITH PROPOFOL N/A 03/17/2018   Procedure: COLONOSCOPY WITH PROPOFOL;  Surgeon: Danis, Henry L III, MD;  Location: [facility];  Service: Gastroenterology;  Laterality: N/A;  . ENTEROSCOPY N/A 12/04/2012   Procedure: ENTEROSCOPY;  Surgeon: Patrick D Hung, MD;  [facility];   Service: Endoscopy;  Laterality: N/A;  . ENTEROSCOPY N/A 12/18/2012   Procedure: ENTEROSCOPY;  Surgeon: Patrick D Hung, MD;  [facility];  Service: Endoscopy;  Laterality: N/A;  . ENTEROSCOPY N/A 02/25/2015   Procedure: ENTEROSCOPY;  Surgeon: Robert D Kaplan, MD;  [facility]: [facility];  Service: Endoscopy;  Laterality: N/A;  . ENTEROSCOPY N/A 10/29/2017   Procedure: ENTEROSCOPY;  Surgeon: Danis, Henry L III, MD;  [facility];  Service: Gastroenterology;  Laterality: N/A;  . ESOPHAGOGASTRODUODENOSCOPY  05/05/2011   Procedure: ESOPHAGOGASTRODUODENOSCOPY (EGD);  Surgeon: Jay Pyrtle, MD;  [facility];  Service: Gastroenterology;  Laterality: N/A;  Dr. Pyrtle will do procedure for Dr. Hung Saturday.  . ESOPHAGOGASTRODUODENOSCOPY  05/08/2011   Procedure: ESOPHAGOGASTRODUODENOSCOPY (EGD);  Surgeon: Patrick D Hung;  [facility];  Service: Endoscopy;  Laterality: N/A;  . ESOPHAGOGASTRODUODENOSCOPY  06/07/2011   Procedure: ESOPHAGOGASTRODUODENOSCOPY (EGD);  Surgeon: Patrick D Hung, MD;  [facility];  Service: Endoscopy;  Laterality: N/A;  . ESOPHAGOGASTRODUODENOSCOPY  12/20/2011   Procedure: ESOPHAGOGASTRODUODENOSCOPY (EGD);  Surgeon: Patrick D Hung, MD;  [facility];  Service: Endoscopy;  Laterality: N/A;  . EYE SURGERY Bilateral    cataracts  . FLEXIBLE SIGMOIDOSCOPY  12/21/2011   Procedure: FLEXIBLE SIGMOIDOSCOPY;  Surgeon: Patrick D Hung, MD;  [facility];  Service: Endoscopy;  Laterality: N/A;  . HOT HEMOSTASIS  06/07/2011   Procedure: HOT HEMOSTASIS (ARGON PLASMA COAGULATION/BICAP);  Surgeon: Patrick D Hung, MD;  [facility];  Service: Endoscopy;  Laterality: N/A;  . INCISIONAL HERNIA REPAIR N/A 06/01/2014   Procedure: ATTEMPTED LAPAROSCOPIC AND OPEN INCISIONAL HERNIA REPAIR WITH MESH;  Surgeon: Douglas Blackman, MD;  [facility];  Service: General;  Laterality: N/A;  . INSERTION OF MESH N/A 06/01/2014   Procedure:  INSERTION OF MESH;  Surgeon: Douglas Blackman, MD;  [facility];  Service: General;  Laterality: N/A;  . LAPAROTOMY  02/16/2012   Procedure: EXPLORATORY LAPAROTOMY;  Surgeon: Benjamin T Hoxworth, MD;  [facility]: [facility];  Service: General;  Laterality: N/A;  oversewing of anastomotic leak and rigid sigmoidoscopy  . LAPAROTOMY N/A 06/21/2014   Procedure: ABDOMINAL WOUND EXPLORATION;  Surgeon: Douglas Blackman, MD;  [facility];  Service: General;  Laterality: N/A;  . LEG AMPUTATION ABOVE KNEE     left  . PARTIAL COLECTOMY  02/15/2012   Procedure: PARTIAL COLECTOMY;  Surgeon: Douglas A Blackman, MD;  [facility];  Service: General;  Laterality: N/A;  . TONSILLECTOMY      I have reviewed the social history and family history with the patient and they are unchanged from previous note.  ALLERGIES:  is allergic to ciprofloxacin, morphine and related, penicillins, codeine, lisinopril, and feraheme [ferumoxytol].  MEDICATIONS:  Current Outpatient Medications  Medication Sig Dispense Refill  . ACCU-CHEK FASTCLIX LANCETS MISC Use to test blood glucose 3 times daily. 100 each 11  . allopurinol (ZYLOPRIM) 100 MG tablet TAKE 1 TABLET (100 MG TOTAL) BY MOUTH DAILY. 30 tablet 5  . amLODipine (NORVASC) 10 MG tablet Take 1 tablet (10 mg total) by   mouth daily. 90 tablet 3  . aspirin EC 81 MG tablet Take 1 tablet (81 mg total) by mouth daily. 30 tablet 1  . Blood Glucose Monitoring Suppl (ACCU-CHEK GUIDE) w/Device KIT 1 each by Does not apply route 3 (three) times daily. The patient is insulin requiring, ICD 10 code E11.65. The patient tests 3 times a day. 1 kit 1  . cetirizine (ZYRTEC) 10 MG tablet Take 1 tablet (10 mg total) by mouth daily. 30 tablet 0  . cholecalciferol (VITAMIN D) 1000 units tablet Take 1 tablet (1,000 Units total) by mouth daily. 100 tablet 2  . colchicine 0.6 MG tablet TAKE 1 TABLET (0.6 MG TOTAL) BY MOUTH DAILY. 30 tablet 1  . Dulaglutide (TRULICITY) 5.78 IO/9.6EX SOPN Inject  0.5 mLs (0.75 mg total) into the skin once a week. 6 mL 6  . feeding supplement, GLUCERNA SHAKE, (GLUCERNA SHAKE) LIQD Take 237 mLs by mouth 3 (three) times daily between meals. (Patient taking differently: Take 237 mLs by mouth 2 (two) times daily between meals. ) 90 Can 0  . gabapentin (NEURONTIN) 400 MG capsule Take 1-2 capsules (400-800 mg total) by mouth See admin instructions. Take 1 capsule (486m) in the morning, 1 capsule (4044m in the afternoon and 2  Capsules (80028mbefore bedtime 180 capsule 3  . glucose blood (ACCU-CHEK GUIDE) test strip USE TO TEST BLOOD SUGAR THREE TIMES A DAY 100 strip 3  . losartan (COZAAR) 100 MG tablet Take 1 tablet (100 mg total) by mouth daily. 90 tablet 3  . metFORMIN (GLUCOPHAGE) 1000 MG tablet TAKE 1 TABLET BY MOUTH TWICE A DAY WITH A MEAL 90 tablet 6  . omeprazole (PRILOSEC) 20 MG capsule Take 1 capsule (20 mg total) by mouth daily. 90 capsule 1   No current facility-administered medications for this visit.    PHYSICAL EXAMINATION: ECOG PERFORMANCE STATUS: 2 - Symptomatic, <50% confined to bed  Vitals:   06/29/20 0945  BP: 126/75  Pulse: 86  Resp: 18  Temp: 98.1 F (36.7 C)  SpO2: 100%   Filed Weights   06/29/20 0945  Weight: 166 lb 8 oz (75.5 kg)    Due to COVID19 we will limit examination to appearance. Patient had no complaints.  GENERAL:alert, no distress and comfortable SKIN: skin color normal, no rashes or significant lesions EYES: normal, Conjunctiva are pink and non-injected, sclera clear  NEURO: alert & oriented x 3 with fluent speech   LABORATORY DATA:  I have reviewed the data as listed CBC Latest Ref Rng & Units 06/29/2020 06/08/2020 05/16/2020  WBC 4.0 - 10.5 K/uL 8.8 7.1 6.7  Hemoglobin 12.0 - 15.0 g/dL 8.1(L) 10.2(L) 9.9(L)  Hematocrit 36.0 - 46.0 % 24.8(L) 31.3(L) 31.5(L)  Platelets 150 - 400 K/uL 430(H) 389 308     CMP Latest Ref Rng & Units 04/27/2020 04/06/2020 03/23/2020  Glucose 70 - 99 mg/dL 105(H) 127(H)  133(H)  BUN 8 - 23 mg/dL _0 Creatinine 0.44 - 1.00 mg/dL 1.17(H) 1.18(H) 1.14(H)  Sodium 135 - 145 mmol/L 140 142 143  Potassium 3.5 - 5.1 mmol/L 4.0 3.7 3.3(L)  Chloride 98 - 111 mmol/L 106 103 106  CO2 22 - 32 mmol/L _1 Calcium 8.9 - 10.3 mg/dL 8.6(L) 8.9 8.6(L)  Total Protein 6.5 - 8.1 g/dL 6.6 6.7 6.3(L)  Total Bilirubin 0.3 - 1.2 mg/dL 0.2(L) 0.3 <0.2(L)  Alkaline Phos 38 - 126 U/L 64 70 63  AST 15 - 41 U/L _2 ALT  0 - 44 U/L _0 RADIOGRAPHIC STUDIES: I have personally reviewed the radiological images as listed and agreed with the findings in the report. No results found.   ASSESSMENT & PLAN:  [name] is a 69 y.o. female with   1. Iron deficiency anemia secondary to GIbloodloss,andAnemia of Chronic Disease  -History of multiple GI bleeding in 2016, required blood transfusion, hospitalization and IV Feraheme -She had an EGD and colonoscopy on 10/29/2017 and 03/17/2018 with Dr. Loletha Carrow, which was normal.  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  -She has been on oral iron for 5-6 years now and has hadintermittentblack watery stool.  -She has been receiving IV Feraheme every1-65monthon average lately.Goal is to keep her Hg >10and ferritin>100.Her last IV Feraheme was on 06/29/2020 -Pt notes she has fair energy. She has intermittent black stool in recent months. Labs reviewed, Hg decreased to 8.1, plt 430. Ferritin is still pending. Will proceed with IV Feraheme today. No blood transfusion today.  -Will monitor with lab every week X4 given GI bleeding.  -F/u in 1 month  2. Melena -She has had intermittent black stool for months which is contributing to her anemia.  -I recommend she f/u with GI Dr DLoletha Carrow H r PCP recommended this as well given her h/o AVM. I will send Dr DLoletha Carrowa message about her being seen. She is agreeable.   3. Porphyria cutaneoustarda (PCT), resolvedafter Hep C  Treatment  4.Chronic hepatitis C infection -She tested positive for hepatitis C in 2009. She has previously completed treatment for hepatitis C in 2016 and repeated test was negative.  -She had an EGD and colonoscopy on 10/29/2017 and 03/17/2018 with Dr. DLoletha Carrow which was normal. SDorothyann Gibbscontinue to follow up with him. -05/2018 liver [place] unremark[place].  -She gets 2 flu shots a year (March and October) and she received her COVID booster in 02/2020.   5. DM2 -Management per PCP's office.  6. LeftAKA, hasprosthesis, she is wheelchair bound most of time, Right LE Edema  -LE edemacould be related to nutritional deficiencies or decreased venous return. I encouraged her to elevateher right leg.  -On 12/30/19 she had a mechanical fall,resulting insignificant tenderness in the left elbow and left stump at amputation above knee. There were no fractures on her 01/01/20 Xray. -For pain she has been on Oxycodone q6hours. Her left shoulder pain has improved but her left stump pain mostly stable.  -She notes she has been using crutches more lately and is working towards walking independently. 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 Smoking cessation  -She is still actively smoking,she isnot ready to quit, however, she knows that she needs to quit  Plan -Lab reviewed, will proceed with IV Feraheme today  -Lab weekly X4, will set up IV Feraheme if ferritin less than 100, blood transfusion if Hg<8.0 -Follow-up in 4 weeks   -Copy note to Dr DLoletha Carrowabout GI bleeding. Copy note to PCP about her overall care.  -I provided her information to call for COVID test appointments and free home tests.     No problem-specific Assessment & Plan notes found for this encounter.   No orders of the defined types were placed in this encounter.  All questions were answered. The patient knows to call the clinic with any problems, questions or concerns. No barriers to learning was  detected. The total time spent in the  appointment was 30 minutes.      , MD 06/29/2020   I, Amoya Bennett, am acting as scribe for  , MD.   I have reviewed the above documentation for accuracy and completeness, and I agree with the above.       

## 2020-06-28 ENCOUNTER — Encounter: Payer: Self-pay | Admitting: *Deleted

## 2020-06-28 NOTE — Progress Notes (Signed)

## 2020-06-29 ENCOUNTER — Inpatient Hospital Stay: Payer: Medicare Other

## 2020-06-29 ENCOUNTER — Encounter: Payer: Self-pay | Admitting: Hematology

## 2020-06-29 ENCOUNTER — Inpatient Hospital Stay (HOSPITAL_BASED_OUTPATIENT_CLINIC_OR_DEPARTMENT_OTHER): Payer: Medicare Other | Admitting: Hematology

## 2020-06-29 ENCOUNTER — Inpatient Hospital Stay: Payer: Medicare Other | Attending: Hematology

## 2020-06-29 ENCOUNTER — Other Ambulatory Visit: Payer: Self-pay

## 2020-06-29 VITALS — BP 126/75 | HR 86 | Temp 98.1°F | Resp 18 | Ht 62.0 in | Wt 166.5 lb

## 2020-06-29 VITALS — BP 115/72 | HR 80

## 2020-06-29 DIAGNOSIS — Z86010 Personal history of colonic polyps: Secondary | ICD-10-CM | POA: Diagnosis not present

## 2020-06-29 DIAGNOSIS — R600 Localized edema: Secondary | ICD-10-CM | POA: Insufficient documentation

## 2020-06-29 DIAGNOSIS — B182 Chronic viral hepatitis C: Secondary | ICD-10-CM | POA: Insufficient documentation

## 2020-06-29 DIAGNOSIS — K219 Gastro-esophageal reflux disease without esophagitis: Secondary | ICD-10-CM | POA: Insufficient documentation

## 2020-06-29 DIAGNOSIS — K5520 Angiodysplasia of colon without hemorrhage: Secondary | ICD-10-CM | POA: Insufficient documentation

## 2020-06-29 DIAGNOSIS — Z7982 Long term (current) use of aspirin: Secondary | ICD-10-CM | POA: Diagnosis not present

## 2020-06-29 DIAGNOSIS — D500 Iron deficiency anemia secondary to blood loss (chronic): Secondary | ICD-10-CM

## 2020-06-29 DIAGNOSIS — Z8619 Personal history of other infectious and parasitic diseases: Secondary | ICD-10-CM | POA: Diagnosis not present

## 2020-06-29 DIAGNOSIS — E1151 Type 2 diabetes mellitus with diabetic peripheral angiopathy without gangrene: Secondary | ICD-10-CM | POA: Insufficient documentation

## 2020-06-29 DIAGNOSIS — K921 Melena: Secondary | ICD-10-CM | POA: Diagnosis not present

## 2020-06-29 DIAGNOSIS — R5383 Other fatigue: Secondary | ICD-10-CM | POA: Insufficient documentation

## 2020-06-29 DIAGNOSIS — F17210 Nicotine dependence, cigarettes, uncomplicated: Secondary | ICD-10-CM | POA: Diagnosis not present

## 2020-06-29 DIAGNOSIS — I10 Essential (primary) hypertension: Secondary | ICD-10-CM | POA: Insufficient documentation

## 2020-06-29 DIAGNOSIS — E1136 Type 2 diabetes mellitus with diabetic cataract: Secondary | ICD-10-CM | POA: Diagnosis not present

## 2020-06-29 DIAGNOSIS — Z79899 Other long term (current) drug therapy: Secondary | ICD-10-CM | POA: Insufficient documentation

## 2020-06-29 DIAGNOSIS — K922 Gastrointestinal hemorrhage, unspecified: Secondary | ICD-10-CM

## 2020-06-29 LAB — CBC WITH DIFFERENTIAL (CANCER CENTER ONLY)
Abs Immature Granulocytes: 0.1 10*3/uL — ABNORMAL HIGH (ref 0.00–0.07)
Basophils Absolute: 0.1 10*3/uL (ref 0.0–0.1)
Basophils Relative: 1 %
Eosinophils Absolute: 0.2 10*3/uL (ref 0.0–0.5)
Eosinophils Relative: 2 %
HCT: 24.8 % — ABNORMAL LOW (ref 36.0–46.0)
Hemoglobin: 8.1 g/dL — ABNORMAL LOW (ref 12.0–15.0)
Immature Granulocytes: 1 %
Lymphocytes Relative: 12 %
Lymphs Abs: 1.1 10*3/uL (ref 0.7–4.0)
MCH: 32 pg (ref 26.0–34.0)
MCHC: 32.7 g/dL (ref 30.0–36.0)
MCV: 98 fL (ref 80.0–100.0)
Monocytes Absolute: 0.9 10*3/uL (ref 0.1–1.0)
Monocytes Relative: 11 %
Neutro Abs: 6.5 10*3/uL (ref 1.7–7.7)
Neutrophils Relative %: 73 %
Platelet Count: 430 10*3/uL — ABNORMAL HIGH (ref 150–400)
RBC: 2.53 MIL/uL — ABNORMAL LOW (ref 3.87–5.11)
RDW: 17.3 % — ABNORMAL HIGH (ref 11.5–15.5)
WBC Count: 8.8 10*3/uL (ref 4.0–10.5)
nRBC: 0 % (ref 0.0–0.2)

## 2020-06-29 LAB — FERRITIN: Ferritin: 116 ng/mL (ref 11–307)

## 2020-06-29 MED ORDER — SODIUM CHLORIDE 0.9 % IV SOLN
INTRAVENOUS | Status: DC
  Filled 2020-06-29: qty 250

## 2020-06-29 MED ORDER — FAMOTIDINE IN NACL 20-0.9 MG/50ML-% IV SOLN
INTRAVENOUS | Status: AC
  Filled 2020-06-29: qty 50

## 2020-06-29 MED ORDER — DIPHENHYDRAMINE HCL 50 MG/ML IJ SOLN
INTRAMUSCULAR | Status: AC
  Filled 2020-06-29: qty 1

## 2020-06-29 MED ORDER — SODIUM CHLORIDE 0.9 % IV SOLN
510.0000 mg | Freq: Once | INTRAVENOUS | Status: AC
Start: 2020-06-29 — End: 2020-06-29
  Administered 2020-06-29: 510 mg via INTRAVENOUS
  Filled 2020-06-29: qty 510

## 2020-06-29 MED ORDER — DIPHENHYDRAMINE HCL 50 MG/ML IJ SOLN
50.0000 mg | Freq: Once | INTRAMUSCULAR | Status: AC
Start: 2020-06-29 — End: 2020-06-29
  Administered 2020-06-29: 50 mg via INTRAVENOUS

## 2020-06-29 MED ORDER — FAMOTIDINE 20 MG PO TABS
20.0000 mg | ORAL_TABLET | Freq: Once | ORAL | Status: AC
  Administered 2020-06-29: 20 mg via ORAL

## 2020-06-29 MED ORDER — FAMOTIDINE 20 MG PO TABS
ORAL_TABLET | ORAL | Status: AC
  Filled 2020-06-29: qty 1

## 2020-06-29 NOTE — Progress Notes (Signed)
Things That May Be Affecting Your Health: X Alcohol  Hearing loss  Pain    Depression  Home Safety  Sexual Health   Diabetes  Lack of physical activity  Stress   Difficulty with daily activities  Loneliness  Tiredness  X Drug use  Medicines  Tobacco use   Falls  Motor Vehicle Safety  Weight   Food choices  Oral Health  Other    YOUR PERSONALIZED HEALTH PLAN : 1. Schedule your next subsequent Medicare Wellness visit in one year 2. Attend all of your regular appointments to address your medical issues 3. Complete the preventative screenings and services   Annual Wellness Visit   Medicare Covered Preventative Screenings and [place] Men and Women Who How Often Need? Date of Last Service Action  Abdominal Aortic Aneurysm Adults with AAA risk factors Once     Alcohol Misuse and Counseling All Adults Screening once a year if no alcohol misuse. Counseling up to 4 face to face sessions.     Bone Density Measurement  Adults at risk for osteoporosis Once every 2 yrs X    Lipid Panel Z13.6 All adults without CV disease Once every 5 yrs     Colorectal Cancer   Stool sample or  Colonoscopy All adults 38 and older   Once every year  Every 10 years     Depression All Adults Once a year  Today   Diabetes Screening Blood glucose, post glucose load, or GTT Z13.1  All adults at risk  Pre-diabetics  Once per year  Twice per year     Diabetes  Self-Management Training All adults Diabetics 10 hrs first year; 2 hours subsequent years. Requires Copay     Glaucoma  Diabetics  Family history of glaucoma  African Americans 65 yrs +  Hispanic Americans 15 yrs + Annually - requires coppay X    Hepatitis C Z72.89 or F19.20  High Risk for HCV  Born between 1945 and 1965  Annually  Once     HIV Z11.4 All adults based on risk  Annually btw ages 59 & 1 regardless of risk  Annually > 65 yrs if at increased risk     Lung Cancer Screening Asymptomatic adults aged  17-77 with 30 pack yr history and current smoker OR quit within the last 15 yrs Annually Must have counseling and shared decision making documentation before first screen     Medical Nutrition Therapy Adults with   Diabetes  Renal disease  Kidney transplant within past 3 yrs 3 hours first year; 2 hours subsequent years X    Obesity and Counseling All adults Screening once a year Counseling if BMI 30 or higher  Today   Tobacco Use Counseling Adults who use tobacco  Up to 8 visits in one year     Vaccines Z23  Hepatitis B  Influenza   Pneumonia  Adults   Once  Once every flu season  Two different vaccines separated by one year X    Next Annual Wellness Visit People with Medicare Every year  Today     Services & Screenings Women Who How Often Need  Date of Last Service Action  Mammogram  Z12.31 Women over 80 One baseline ages 34-39. Annually ager 40 yrs+     Pap tests All women Annually if high risk. Every 2 yrs for normal risk women     Screening for cervical cancer with   Pap (Z01.419 nl or Z01.411abnl) &  HPV Z11.51 Women aged 51 to 43 Once every 5 yrs     Screening pelvic and breast exams All women Annually if high risk. Every 2 yrs for normal risk women     Sexually Transmitted Diseases  Chlamydia  Gonorrhea  Syphilis All at risk adults Annually for non pregnant females at increased risk         [place] Men Who How Ofter Need  Date of Last Service Action  Prostate Cancer - DRE & PSA Men over 50 Annually.  DRE might require a copay.     Sexually Transmitted Diseases  Syphilis All at risk adults Annually for men at increased risk

## 2020-06-29 NOTE — Patient Instructions (Signed)
Ferumoxytol injection What is this medicine? FERUMOXYTOL is an iron complex. Iron is used to make healthy red blood cells, which carry oxygen and nutrients throughout the body. This medicine is used to treat iron deficiency anemia. This medicine may be used for other purposes; ask your health care provider or pharmacist if you have questions. COMMON BRAND NAME(S): Feraheme What should I tell my health care provider before I take this medicine? They need to know if you have any of these conditions:  anemia not caused by low iron levels  high levels of iron in the blood  magnetic resonance imaging (MRI) test scheduled  an unusual or allergic reaction to iron, other medicines, foods, dyes, or preservatives  pregnant or trying to get pregnant  breast-feeding How should I use this medicine? This medicine is for injection into a vein. It is given by a health care professional in a hospital or clinic setting. Talk to your pediatrician regarding the use of this medicine in children. Special care may be needed. Overdosage: If you think you have taken too much of this medicine contact a poison control center or emergency room at once. NOTE: This medicine is only for you. Do not share this medicine with others. What if I miss a dose? It is important not to miss your dose. Call your doctor or health care professional if you are unable to keep an appointment. What may interact with this medicine? This medicine may interact with the following medications:  other iron products This list may not describe all possible interactions. Give your health care provider a list of all the medicines, herbs, non-prescription drugs, or dietary supplements you use. Also tell them if you smoke, drink alcohol, or use illegal drugs. Some items may interact with your medicine. What should I watch for while using this medicine? Visit your doctor or healthcare professional regularly. Tell your doctor or healthcare  professional if your symptoms do not start to get better or if they get worse. You may need blood work done while you are taking this medicine. You may need to follow a special diet. Talk to your doctor. Foods that contain iron include: whole grains/cereals, dried fruits, beans, or peas, leafy green vegetables, and organ meats (liver, kidney). What side effects may I notice from receiving this medicine? Side effects that you should report to your doctor or health care professional as soon as possible:  allergic reactions like skin rash, itching or hives, swelling of the face, lips, or tongue  breathing problems  changes in blood pressure  feeling faint or lightheaded, falls  fever or chills  flushing, sweating, or hot feelings  swelling of the ankles or feet Side effects that usually do not require medical attention (report to your doctor or health care professional if they continue or are bothersome):  diarrhea  headache  nausea, vomiting  stomach pain This list may not describe all possible side effects. Call your doctor for medical advice about side effects. You may report side effects to FDA at 1-800-FDA-1088. Where should I keep my medicine? This drug is given in a hospital or clinic and will not be stored at home. NOTE: This sheet is a summary. It may not cover all possible information. If you have questions about this medicine, talk to your doctor, pharmacist, or health care provider.  2021 Elsevier/Gold Standard (2016-07-02 20:21:10)  

## 2020-06-30 ENCOUNTER — Telehealth: Payer: Self-pay | Admitting: Hematology

## 2020-06-30 NOTE — Telephone Encounter (Signed)
Scheduled appts per 2/2 los. Pt confirmed appt dates and times.

## 2020-07-01 ENCOUNTER — Telehealth: Payer: Self-pay

## 2020-07-01 ENCOUNTER — Telehealth: Payer: Self-pay | Admitting: *Deleted

## 2020-07-01 NOTE — Telephone Encounter (Signed)
Spoke with patient, she has been scheduled for a follow up on Tuesday, 07/05/20 at 1:20 PM, patient is aware that she will need to arrive at 1 PM. Patient states that she will need to confirm if [name] can bring her, she gets it through [facility]. Provided patient with our address and phone number. Patient verbalized understanding of all information and had no concerns at the end of the call.

## 2020-07-01 NOTE — Telephone Encounter (Signed)
This CN placed call by phone to client for initial contact.  Client took my name and phone as she was on another call.  Offered to talk with her, make assessment, follow up for any medical/nurse issues.  Client says she will follow up.  Karene Fry, RN, MSN, [place] Office [phone] Cell

## 2020-07-01 NOTE — Telephone Encounter (Signed)
-----   Message from Doran Stabler, MD sent at 07/01/2020 12:41 PM EST ----- [name],    Thanks for letting me know. She is medically complex and haven't seen her in over 2 years. Also, prior w/u found distal small bowel AVMs that could not be reached.  She would actually need a small bowel enteroscopy, which has to be done at the [facility] outpatient endo lab and is tough to get arranged quickly around the rest of the office and endo schedule.  I will see her in clinic next week.  But I am concerned her Hgb may drop further.  Looks like she has a lab appointment with you on Monday (2/7).  If her hgb is below 8 she would need transfusion and possibly hospitalization if actively passing melena.  Valora Corporal,    Please have her arrive 1pm for a 120 appointment Tues, Feb 8th   ----- Message ----- From: Truitt Merle, MD Sent: 06/29/2020  10:39 PM EST To: Doran Stabler, MD, Royston Bake, RN, #  Dr. Loletha Carrow,  She has had intermittent melano for the past few weeks, Hg dropped to 8.1 today despite recent iv iron. Could you review her chart to see if she needs EGD again?   Dr. Charleen Kirks, she is not sure why she needs to see Urology Dr. Abner Greenspan, I could not find that in her chart. Did you refer her?  Thanks   [name]

## 2020-07-04 ENCOUNTER — Inpatient Hospital Stay: Payer: Medicare Other

## 2020-07-04 ENCOUNTER — Other Ambulatory Visit: Payer: Self-pay

## 2020-07-04 DIAGNOSIS — D500 Iron deficiency anemia secondary to blood loss (chronic): Secondary | ICD-10-CM

## 2020-07-04 LAB — CBC WITH DIFFERENTIAL (CANCER CENTER ONLY)
Abs Immature Granulocytes: 0.1 10*3/uL — ABNORMAL HIGH (ref 0.00–0.07)
Basophils Absolute: 0 10*3/uL (ref 0.0–0.1)
Basophils Relative: 1 %
Eosinophils Absolute: 0.1 10*3/uL (ref 0.0–0.5)
Eosinophils Relative: 2 %
HCT: 25.2 % — ABNORMAL LOW (ref 36.0–46.0)
Hemoglobin: 7.8 g/dL — ABNORMAL LOW (ref 12.0–15.0)
Immature Granulocytes: 1 %
Lymphocytes Relative: 15 %
Lymphs Abs: 1.1 10*3/uL (ref 0.7–4.0)
MCH: 32.2 pg (ref 26.0–34.0)
MCHC: 31 g/dL (ref 30.0–36.0)
MCV: 104.1 fL — ABNORMAL HIGH (ref 80.0–100.0)
Monocytes Absolute: 0.6 10*3/uL (ref 0.1–1.0)
Monocytes Relative: 8 %
Neutro Abs: 5.4 10*3/uL (ref 1.7–7.7)
Neutrophils Relative %: 73 %
Platelet Count: 333 10*3/uL (ref 150–400)
RBC: 2.42 MIL/uL — ABNORMAL LOW (ref 3.87–5.11)
RDW: 17.6 % — ABNORMAL HIGH (ref 11.5–15.5)
WBC Count: 7.3 10*3/uL (ref 4.0–10.5)
nRBC: 0 % (ref 0.0–0.2)

## 2020-07-04 LAB — FERRITIN: Ferritin: 333 ng/mL — ABNORMAL HIGH (ref 11–307)

## 2020-07-04 LAB — IRON AND TIBC
Iron: 83 ug/dL (ref 41–142)
Saturation Ratios: 27 % (ref 21–57)
TIBC: 304 ug/dL (ref 236–444)
UIBC: 221 ug/dL (ref 120–384)

## 2020-07-05 ENCOUNTER — Encounter: Payer: Self-pay | Admitting: Gastroenterology

## 2020-07-05 ENCOUNTER — Other Ambulatory Visit: Payer: Self-pay | Admitting: Hematology

## 2020-07-05 ENCOUNTER — Ambulatory Visit (INDEPENDENT_AMBULATORY_CARE_PROVIDER_SITE_OTHER): Payer: Medicare Other | Admitting: Gastroenterology

## 2020-07-05 VITALS — BP 106/60 | HR 94 | Ht 62.0 in | Wt 166.0 lb

## 2020-07-05 DIAGNOSIS — K921 Melena: Secondary | ICD-10-CM

## 2020-07-05 DIAGNOSIS — D500 Iron deficiency anemia secondary to blood loss (chronic): Secondary | ICD-10-CM

## 2020-07-05 DIAGNOSIS — K5521 Angiodysplasia of colon with hemorrhage: Secondary | ICD-10-CM

## 2020-07-05 NOTE — Progress Notes (Signed)
[facility] Progress Note  Chief Complaint: Melena and iron deficiency anemia  Subjective  History: I last saw [name] in September 2016 for ongoing evaluation of recurrent iron deficiency anemia from small bowel blood loss.  Extensive prior work-up determined to be small bowel AVMs as source.  In 2019 no source was found on a small bowel enteroscopy, and after the September office visit I then did a colonoscopy with no source found. I recently received a message from Dr.Feng, who has been giving this patient periodic iron therapy but lately noticed a significant drop in hemoglobin with the patient also noting intermittent black tarry stool.  [name] tells me she has had black stool nearly every day for the last several weeks, including with the last few hours.  She has chronic lower abdominal pain since a complex colonic surgery years ago.  She has generalized fatigue and says she always feels "terrible" for days after an iron infusion.  She is also quite sad because her sister passed late last week and the funeral is tomorrow.  She expressed frustration that she has had GI bleeding for many years "and nobody can tell me what is going on".  ROS: Cardiovascular:  no chest pain Respiratory: no dyspnea She has limited mobility with prior left-sided amputation, has a prosthetic and to support canes.  She came to the office with a wheelchair today Remainder systems negative except as above  The patient's Past Medical, Family and Social History were reviewed and are on file in the EMR. Past Medical History:  Diagnosis Date  . Allergic rhinitis   . Anemia, iron deficiency 05/03/2011   2/2 GI AVMs - recieves iron infusions as well as periodic red blood cell transfusions  . Anxiety   . Arteriovenous malformation of duodenum 02/25/2015  . CARPAL TUNNEL RELEASE, HX OF 07/22/2008  . Chest pain 10/28/2019  . Colon polyps   . Diabetes mellitus without complication ([place])    type 2  . Difficulty  sleeping    takes trazadone for sleep  . Diverticulosis   . Fall 01/03/2020  . GERD (gastroesophageal reflux disease)   . Hepatitis C    in SVR with Harvoni 12/2014-02/2015.   Marland Kitchen History of hernia repair 08/18/2012  . History of porphyria 11/30/2014   Porphyria cutanea tarda in setting of chronic Hepatitis C, frequent  IV iron infusions, and alcohol abuse   . Hypertension   . Hypocalcemia 10/28/2019  . Hypokalemia 09/27/2014  . Peripheral vascular disease ([place])   . Stroke [facility]) 2010   no residual deficits  . Substance use disorder    cocaine  . Tachycardia 07/10/2017  . Trichomonas vaginitis 04/16/2019   Past Surgical History:  Procedure Laterality Date  . ABDOMINAL HYSTERECTOMY    . APPLICATION OF WOUND VAC N/A 06/21/2014   Procedure: APPLICATION OF WOUND VAC;  Surgeon: Patricia Keens, MD;  Location: [facility];  Service: General;  Laterality: N/A;  . CARPAL TUNNEL RELEASE     rt hand  . COLONOSCOPY WITH PROPOFOL N/A 03/17/2018   Procedure: COLONOSCOPY WITH PROPOFOL;  Surgeon: Doran Stabler, MD;  Location: [facility];  Service: Gastroenterology;  Laterality: N/A;  . ENTEROSCOPY N/A 12/04/2012   Procedure: ENTEROSCOPY;  Surgeon: Beryle Beams, MD;  Location: [facility];  Service: Endoscopy;  Laterality: N/A;  . ENTEROSCOPY N/A 12/18/2012   Procedure: ENTEROSCOPY;  Surgeon: Beryle Beams, MD;  Location: [facility];  Service: Endoscopy;  Laterality: N/A;  . ENTEROSCOPY N/A 02/25/2015  Procedure: ENTEROSCOPY;  Surgeon: Inda Castle, MD;  Location: [facility];  Service: Endoscopy;  Laterality: N/A;  . ENTEROSCOPY N/A 10/29/2017   Procedure: ENTEROSCOPY;  Surgeon: Doran Stabler, MD;  [facility];  Service: Gastroenterology;  Laterality: N/A;  . ESOPHAGOGASTRODUODENOSCOPY  05/05/2011   Procedure: ESOPHAGOGASTRODUODENOSCOPY (EGD);  Surgeon: Zenovia Jarred, MD;  Location: [facility];  Service: Gastroenterology;  Laterality: N/A;  Dr. Hilarie Fredrickson will do procedure for Dr. Benson Norway  Saturday.  . ESOPHAGOGASTRODUODENOSCOPY  05/08/2011   Procedure: ESOPHAGOGASTRODUODENOSCOPY (EGD);  Surgeon: Beryle Beams;  [facility];  Service: Endoscopy;  Laterality: N/A;  . ESOPHAGOGASTRODUODENOSCOPY  06/07/2011   Procedure: ESOPHAGOGASTRODUODENOSCOPY (EGD);  Surgeon: Beryle Beams, MD;  Location[facility];  Service: Endoscopy;  Laterality: N/A;  . ESOPHAGOGASTRODUODENOSCOPY  12/20/2011   Procedure: ESOPHAGOGASTRODUODENOSCOPY (EGD);  Surgeon: Beryle Beams, MD;  Location: [facility];  Service: Endoscopy;  Laterality: N/A;  . EYE SURGERY Bilateral    cataracts  . FLEXIBLE SIGMOIDOSCOPY  12/21/2011   Procedure: FLEXIBLE SIGMOIDOSCOPY;  Surgeon: Beryle Beams, MD;  [facility];  Service: Endoscopy;  Laterality: N/A;  . HOT HEMOSTASIS  06/07/2011   Procedure: HOT HEMOSTASIS (ARGON PLASMA COAGULATION/BICAP);  Surgeon: Beryle Beams, MD;  Location: [facility];  Service: Endoscopy;  Laterality: N/A;  . INCISIONAL HERNIA REPAIR N/A 06/01/2014   Procedure: ATTEMPTED LAPAROSCOPIC AND OPEN INCISIONAL HERNIA REPAIR WITH MESH;  Surgeon: Patricia Keens, MD;  Location: [facility];  Service: General;  Laterality: N/A;  . INSERTION OF MESH N/A 06/01/2014   Procedure: INSERTION OF MESH;  Surgeon: Patricia Keens, MD;  Location: [facility];  Service: General;  Laterality: N/A;  . LAPAROTOMY  02/16/2012   Procedure: EXPLORATORY LAPAROTOMY;  Surgeon: Patricia Jolly, MD;  Location: [facility];  Service: General;  Laterality: N/A;  oversewing of anastomotic leak and rigid sigmoidoscopy  . LAPAROTOMY N/A 06/21/2014   Procedure: ABDOMINAL WOUND EXPLORATION;  Surgeon: Patricia Keens, MD;  Location: [facility];  Service: General;  Laterality: N/A;  . LEG AMPUTATION ABOVE KNEE     left  . PARTIAL COLECTOMY  02/15/2012   Procedure: PARTIAL COLECTOMY;  Surgeon: Patricia Bowie, MD;  Location: [facility];  Service: General;  Laterality: N/A;  . TONSILLECTOMY      Objective:  Med list  reviewed  Current Outpatient Medications:  .  ACCU-CHEK FASTCLIX LANCETS MISC, Use to test blood glucose 3 times daily., Disp: 100 each, Rfl: 11 .  allopurinol (ZYLOPRIM) 100 MG tablet, TAKE 1 TABLET (100 MG TOTAL) BY MOUTH DAILY., Disp: 30 tablet, Rfl: 5 .  amLODipine (NORVASC) 10 MG tablet, Take 1 tablet (10 mg total) by mouth daily., Disp: 90 tablet, Rfl: 3 .  aspirin EC 81 MG tablet, Take 1 tablet (81 mg total) by mouth daily., Disp: 30 tablet, Rfl: 1 .  Blood Glucose Monitoring Suppl (ACCU-CHEK GUIDE) w/Device KIT, 1 each by Does not apply route 3 (three) times daily. The patient is insulin requiring, ICD 10 code E11.65. The patient tests 3 times a day., Disp: 1 kit, Rfl: 1 .  cetirizine (ZYRTEC) 10 MG tablet, Take 1 tablet (10 mg total) by mouth daily., Disp: 30 tablet, Rfl: 0 .  cholecalciferol (VITAMIN D) 1000 units tablet, Take 1 tablet (1,000 Units total) by mouth daily., Disp: 100 tablet, Rfl: 2 .  colchicine 0.6 MG tablet, TAKE 1 TABLET (0.6 MG TOTAL) BY MOUTH DAILY. (Patient taking differently: Take 0.6 mg by mouth as needed.), Disp: 30 tablet, Rfl: 1 .  Dulaglutide (TRULICITY) 8.11 BJ/4.7WG SOPN, Inject 0.5 mLs (0.75 mg total) into the skin once a week., Disp: 6 mL, Rfl: 6 .  feeding supplement, GLUCERNA SHAKE, (GLUCERNA SHAKE) LIQD, Take 237 mLs by mouth 3 (three) times daily between meals. (Patient taking differently: Take 237 mLs by mouth 2 (two) times daily between meals.), Disp: 90 Can, Rfl: 0 .  glucose blood (ACCU-CHEK GUIDE) test strip, USE TO TEST BLOOD SUGAR THREE TIMES A DAY, Disp: 100 strip, Rfl: 3 .  losartan (COZAAR) 100 MG tablet, Take 1 tablet (100 mg total) by mouth daily., Disp: 90 tablet, Rfl: 3 .  metFORMIN (GLUCOPHAGE) 1000 MG tablet, TAKE 1 TABLET BY MOUTH TWICE A DAY WITH A MEAL, Disp: 90 tablet, Rfl: 6 .  omeprazole (PRILOSEC) 20 MG capsule, Take 1 capsule (20 mg total) by mouth daily., Disp: 90 capsule, Rfl: 1 .  gabapentin (NEURONTIN) 400 MG capsule, Take 1-2  capsules (400-800 mg total) by mouth See admin instructions. Take 1 capsule ($RemoveBe'400mg'JRQLJhebG$ ) in the morning, 1 capsule ($RemoveBe'400mg'BRpgTSGyZ$ ) in the afternoon and 2  Capsules ($RemoveBe'800mg'MMNNcoZdd$ ) before bedtime, Disp: 180 capsule, Rfl: 3   Vital signs in last 24 hrs: Vitals:   07/05/20 1315  BP: 106/60  Pulse: 94  SpO2: 95%   Wt Readings from Last 3 Encounters:  07/05/20 166 lb (75.3 kg)  06/29/20 166 lb 8 oz (75.5 kg)  04/18/20 166 lb 4.8 oz (75.4 kg)    Physical Exam  Fatigued, not acutely ill-appearing, tearful at times  HEENT: sclera anicteric, oral mucosa moist without lesions.  Conjunctiva pale  Neck: supple, no thyromegaly, JVD or lymphadenopathy  Cardiac: RRR without murmurs, S1S2 heard, no peripheral edema  Pulm: clear to auscultation bilaterally, normal RR and effort noted  Abdomen: soft, complex midline lower scar that healed by secondary intention, mild LLQ tenderness, with active bowel sounds. No guarding or palpable hepatosplenomegaly.  Skin; warm and dry, no jaundice or rash It took 2 staff members to help her change again on exam table for rectal exam. Semiformed, black, strongly heme positive stool. Labs:  CBC Latest Ref Rng & Units 07/04/2020 06/29/2020 06/08/2020  WBC 4.0 - 10.5 K/uL 7.3 8.8 7.1  Hemoglobin 12.0 - 15.0 g/dL 7.8(L) 8.1(L) 10.2(L)  Hematocrit 36.0 - 46.0 % 25.2(L) 24.8(L) 31.3(L)  Platelets 150 - 400 K/uL 333 430(H) 389   On 07/04/2020:  Iron 83, TIBC 304, 27% saturation, ferritin 333 (soon after an iron infusion)  On 06/08/2020 iron 44, saturation 30%, ferritin 50, and hemoglobin 10.2 on that date  Last B12 level on file was from 9562, last folic acid level from 1308 ___________________________________________ Radiologic studies:   ____________________________________________ Other:   _____________________________________________ Assessment & Plan  Assessment: Encounter Diagnoses  Name Primary?  . Melena Yes  . Iron deficiency anemia due to chronic blood loss   .  AVM (arteriovenous malformation) of small bowel, acquired with hemorrhage    Known small bowel AVMs, and on last endoscopic testing in 2019 were not found to be within the reach of enteroscope.  She has many years of intermittent bleeding requiring IV iron and periodic transfusion. I reviewed with her the history of this problem and her multiple test so far and the nature of small bowel AVMs.  I believe that is still likely to be her source of bleeding, and she has had overt bleeding over the last few weeks that is ongoing. She is hemodynamically stable, but significantly fatigued.  I am concerned that her hemoglobin dropped some more over  several days between checks at the hematology clinic.  She needs transfusion of PRBCs and a small bowel enteroscopy, neither of which I can make arrangements for in the next few days.  The procedure must be done in the hospital endoscopy lab, making the scheduling quite challenging.  She also needs to be transfused before she could be more safely sedated for the procedure. If no source found on small bowel enteroscopy, she may need another small bowel video capsule study. She also has limited mobility and social support.  I recommended she go to the ED today for admission to the medical service with GI consultation and plans for PRBC transfusion and small bowel enteroscopy.  She was frustrated and upset that this problem has gone on, and I did my best to explain the challenging nature of this recurrent obscure GI bleeding.  She was mostly troubled that her sister's funeral is tomorrow evening and she does not want to miss it.  So she ultimately told me that she could not be admitted to the hospital right now, but that she would come to the emergency department the day after tomorrow.  When that occurs, our inpatient consult service will be notified and I will communicate with them as well when that happens.  If Kimmora developed chest pain lightheadedness or any other  symptoms of concern, she was instructed to call EMS to be transported to the hospital immediately.  35 minutes were spent on this encounter (including chart review, history/exam, counseling/coordination of care, and documentation) > 50% of that time was spent on counseling and coordination of care.  Topics discussed included: See above.  Nelida Meuse III

## 2020-07-05 NOTE — Patient Instructions (Signed)
If you are age 68 or older, your body mass index should be between 23-30. Your Body mass index is 30.36 kg/m. If this is out of the aforementioned range listed, please consider follow up with your Primary Care Provider.  If you are age 68 or younger, your body mass index should be between 19-25. Your Body mass index is 30.36 kg/m. If this is out of the aformentioned range listed, please consider follow up with your Primary Care Provider.   It has been recommended by your physician to report to the nearest Emergency Room.   It was a pleasure to see you today!  Dr. Loletha Carrow

## 2020-07-07 ENCOUNTER — Inpatient Hospital Stay (HOSPITAL_BASED_OUTPATIENT_CLINIC_OR_DEPARTMENT_OTHER): Payer: Medicare Other | Admitting: Hematology

## 2020-07-07 ENCOUNTER — Inpatient Hospital Stay: Payer: Medicare Other

## 2020-07-07 ENCOUNTER — Encounter (HOSPITAL_COMMUNITY): Payer: Self-pay | Admitting: Internal Medicine

## 2020-07-07 ENCOUNTER — Other Ambulatory Visit: Payer: Self-pay

## 2020-07-07 ENCOUNTER — Encounter: Payer: Self-pay | Admitting: Hematology

## 2020-07-07 ENCOUNTER — Inpatient Hospital Stay (HOSPITAL_COMMUNITY)
Admission: AD | Admit: 2020-07-07 | Discharge: 2020-07-10 | DRG: 378 | Disposition: A | Discharge status: Home / Self Care | Payer: Medicare Other | Source: Ambulatory Visit | Attending: Internal Medicine | Admitting: Internal Medicine

## 2020-07-07 VITALS — BP 118/60 | HR 96 | Temp 98.8°F | Resp 17 | Ht 62.0 in

## 2020-07-07 DIAGNOSIS — E1140 Type 2 diabetes mellitus with diabetic neuropathy, unspecified: Secondary | ICD-10-CM | POA: Diagnosis present

## 2020-07-07 DIAGNOSIS — K219 Gastro-esophageal reflux disease without esophagitis: Secondary | ICD-10-CM | POA: Diagnosis present

## 2020-07-07 DIAGNOSIS — Z993 Dependence on wheelchair: Secondary | ICD-10-CM

## 2020-07-07 DIAGNOSIS — Z881 Allergy status to other antibiotic agents status: Secondary | ICD-10-CM

## 2020-07-07 DIAGNOSIS — D500 Iron deficiency anemia secondary to blood loss (chronic): Secondary | ICD-10-CM | POA: Diagnosis present

## 2020-07-07 DIAGNOSIS — Z888 Allergy status to other drugs, medicaments and biological substances status: Secondary | ICD-10-CM

## 2020-07-07 DIAGNOSIS — F17210 Nicotine dependence, cigarettes, uncomplicated: Secondary | ICD-10-CM | POA: Diagnosis present

## 2020-07-07 DIAGNOSIS — K922 Gastrointestinal hemorrhage, unspecified: Secondary | ICD-10-CM

## 2020-07-07 DIAGNOSIS — E1122 Type 2 diabetes mellitus with diabetic chronic kidney disease: Secondary | ICD-10-CM | POA: Diagnosis present

## 2020-07-07 DIAGNOSIS — Z79899 Other long term (current) drug therapy: Secondary | ICD-10-CM

## 2020-07-07 DIAGNOSIS — D649 Anemia, unspecified: Secondary | ICD-10-CM

## 2020-07-07 DIAGNOSIS — K5521 Angiodysplasia of colon with hemorrhage: Principal | ICD-10-CM | POA: Diagnosis present

## 2020-07-07 DIAGNOSIS — Z880 Allergy status to penicillin: Secondary | ICD-10-CM

## 2020-07-07 DIAGNOSIS — Z809 Family history of malignant neoplasm, unspecified: Secondary | ICD-10-CM

## 2020-07-07 DIAGNOSIS — Z885 Allergy status to narcotic agent status: Secondary | ICD-10-CM

## 2020-07-07 DIAGNOSIS — Z20822 Contact with and (suspected) exposure to covid-19: Secondary | ICD-10-CM | POA: Diagnosis present

## 2020-07-07 DIAGNOSIS — Z89612 Acquired absence of left leg above knee: Secondary | ICD-10-CM

## 2020-07-07 DIAGNOSIS — M109 Gout, unspecified: Secondary | ICD-10-CM | POA: Diagnosis present

## 2020-07-07 DIAGNOSIS — Z8673 Personal history of transient ischemic attack (TIA), and cerebral infarction without residual deficits: Secondary | ICD-10-CM

## 2020-07-07 DIAGNOSIS — I10 Essential (primary) hypertension: Secondary | ICD-10-CM | POA: Diagnosis not present

## 2020-07-07 DIAGNOSIS — Z8249 Family history of ischemic heart disease and other diseases of the circulatory system: Secondary | ICD-10-CM

## 2020-07-07 DIAGNOSIS — Z7982 Long term (current) use of aspirin: Secondary | ICD-10-CM

## 2020-07-07 DIAGNOSIS — D62 Acute posthemorrhagic anemia: Secondary | ICD-10-CM | POA: Diagnosis present

## 2020-07-07 DIAGNOSIS — Z8619 Personal history of other infectious and parasitic diseases: Secondary | ICD-10-CM

## 2020-07-07 HISTORY — DX: Gastrointestinal hemorrhage, unspecified: K92.2

## 2020-07-07 LAB — GLUCOSE, CAPILLARY
Glucose-Capillary: 88 mg/dL (ref 70–99)
Glucose-Capillary: 73 mg/dL (ref 70–99)

## 2020-07-07 LAB — SARS CORONAVIRUS 2 (TAT 6-24 HRS): SARS Coronavirus 2: NEGATIVE

## 2020-07-07 LAB — CBC WITH DIFFERENTIAL (CANCER CENTER ONLY)
Abs Immature Granulocytes: 0.11 10*3/uL — ABNORMAL HIGH (ref 0.00–0.07)
Basophils Absolute: 0 10*3/uL (ref 0.0–0.1)
Basophils Relative: 0 %
Eosinophils Absolute: 0.1 10*3/uL (ref 0.0–0.5)
Eosinophils Relative: 1 %
HCT: 24.5 % — ABNORMAL LOW (ref 36.0–46.0)
Hemoglobin: 7.7 g/dL — ABNORMAL LOW (ref 12.0–15.0)
Immature Granulocytes: 1 %
Lymphocytes Relative: 10 %
Lymphs Abs: 0.9 10*3/uL (ref 0.7–4.0)
MCH: 32.6 pg (ref 26.0–34.0)
MCHC: 31.4 g/dL (ref 30.0–36.0)
MCV: 103.8 fL — ABNORMAL HIGH (ref 80.0–100.0)
Monocytes Absolute: 0.5 10*3/uL (ref 0.1–1.0)
Monocytes Relative: 6 %
Neutro Abs: 7.3 10*3/uL (ref 1.7–7.7)
Neutrophils Relative %: 82 %
Platelet Count: 340 10*3/uL (ref 150–400)
RBC: 2.36 MIL/uL — ABNORMAL LOW (ref 3.87–5.11)
RDW: 17.9 % — ABNORMAL HIGH (ref 11.5–15.5)
WBC Count: 8.9 10*3/uL (ref 4.0–10.5)
nRBC: 0.2 % (ref 0.0–0.2)

## 2020-07-07 LAB — COMPREHENSIVE METABOLIC PANEL
ALT: 13 U/L (ref 0–44)
AST: 21 U/L (ref 15–41)
Albumin: 3.8 g/dL (ref 3.5–5.0)
Alkaline Phosphatase: 54 U/L (ref 38–126)
Anion gap: 9 (ref 5–15)
BUN: 19 mg/dL (ref 8–23)
CO2: 28 mmol/L (ref 22–32)
Calcium: 8.8 mg/dL — ABNORMAL LOW (ref 8.9–10.3)
Chloride: 103 mmol/L (ref 98–111)
Creatinine, Ser: 1.59 mg/dL — ABNORMAL HIGH (ref 0.44–1.00)
GFR, Estimated: 35 mL/min — ABNORMAL LOW (ref >60)
Glucose, Bld: 90 mg/dL (ref 70–99)
Potassium: 3.7 mmol/L (ref 3.5–5.1)
Sodium: 140 mmol/L (ref 135–145)
Total Bilirubin: 0.4 mg/dL (ref 0.3–1.2)
Total Protein: 7 g/dL (ref 6.5–8.1)

## 2020-07-07 LAB — SAMPLE TO BLOOD BANK

## 2020-07-07 LAB — FERRITIN: Ferritin: 214 ng/mL (ref 11–307)

## 2020-07-07 LAB — HEMOGLOBIN A1C
Hgb A1c MFr Bld: 4 % — ABNORMAL LOW (ref 4.8–5.6)
Mean Plasma Glucose: 68.1 mg/dL

## 2020-07-07 LAB — HIV ANTIBODY (ROUTINE TESTING W REFLEX): HIV Screen 4th Generation wRfx: NONREACTIVE

## 2020-07-07 MED ORDER — METOPROLOL TARTRATE 5 MG/5ML IV SOLN: 5.0000 mg | Freq: Four times a day (QID) | INTRAVENOUS | Status: DC | PRN

## 2020-07-07 MED ORDER — SODIUM CHLORIDE 0.9% IV SOLUTION: Freq: Once | INTRAVENOUS | Status: AC

## 2020-07-07 MED ORDER — INSULIN ASPART 100 UNIT/ML ~~LOC~~ SOLN
0.0000 [IU] | Freq: Three times a day (TID) | SUBCUTANEOUS | Status: DC
  Administered 2020-07-08: 5 [IU] via SUBCUTANEOUS
  Administered 2020-07-09: 1 [IU] via SUBCUTANEOUS

## 2020-07-07 MED ORDER — INSULIN ASPART 100 UNIT/ML ~~LOC~~ SOLN: 0.0000 [IU] | Freq: Every day | SUBCUTANEOUS | Status: DC

## 2020-07-07 NOTE — Progress Notes (Signed)
Patient taken to room 1616. Bedside report given to [facility], [place].  All questions answered.  PT assisted to bedside chair.  She had call bell in her hand and phone within reach.

## 2020-07-07 NOTE — H&P (Addendum)
History and Physical        Hospital Admission Note Date: 07/07/2020  Patient name: [name] record number: [id number] Date of birth: 05-23-1951 Age: 70 y.o. Gender: female  PCP: Jose Persia, MD   Chief Complaint    No chief complaint on file.     HPI:   There is a 70 year old female with past medical history of iron deficiency anemia requiring iron infusions and periodic PRBC transfusions likely secondary to GI AVMs, diabetes, left AKA with prosthesis and wheelchair-bound, HCV s/p treatment, porphyria cutanea tarda, hypertension, PVD, CVA without residual deficit, substance use disorder who presents as a direct admission from Dr. Ernestina Penna office at the cancer center for anemia and GI bleeding work-up.  Patient was seen in the cancer center a week ago for worsening anemia and was urgently referred to her GI doctor, Dr. Loletha Carrow 2 days ago.  Dr. Loletha Carrow recommended hospital admission for GI bleeding work-up however she was unable to get to the ED 2 days ago due to her sister's funeral yesterday.  Patient followed up in the cancer center today for repeat lab work and hemoglobin was found to be low prompting direct hospital admission.  She has been having 1 to black stools every day for the past week most notably in the morning.  Has moderate fatigue but is still able to do her ADLs.  Denies any acute abdominal pain.  She does take baby aspirin and most recent dose was last night.   There were no vitals filed for this visit.   Review of Systems:  Review of Systems  All other systems reviewed and are negative.   Medical/Social/Family History   Past Medical History: Past Medical History:  Diagnosis Date  . Allergic rhinitis   . Anemia, iron deficiency 05/03/2011   2/2 GI AVMs - recieves iron infusions as well as periodic red blood cell transfusions  . Anxiety   .  Arteriovenous malformation of duodenum 02/25/2015  . CARPAL TUNNEL RELEASE, HX OF 07/22/2008  . Chest pain 10/28/2019  . Colon polyps   . Diabetes mellitus without complication ([place])    type 2  . Difficulty sleeping    takes trazadone for sleep  . Diverticulosis   . Fall 01/03/2020  . GERD (gastroesophageal reflux disease)   . Hepatitis C    in SVR with Harvoni 12/2014-02/2015.   Marland Kitchen History of hernia repair 08/18/2012  . History of porphyria 11/30/2014   Porphyria cutanea tarda in setting of chronic Hepatitis C, frequent  IV iron infusions, and alcohol abuse   . Hypertension   . Hypocalcemia 10/28/2019  . Hypokalemia 09/27/2014  . Peripheral vascular disease ([place])   . Stroke [facility]) 2010   no residual deficits  . Substance use disorder    cocaine  . Tachycardia 07/10/2017  . Trichomonas vaginitis 04/16/2019    Past Surgical History:  Procedure Laterality Date  . ABDOMINAL HYSTERECTOMY    . APPLICATION OF WOUND VAC N/A 06/21/2014   Procedure: APPLICATION OF WOUND VAC;  Surgeon: Coralie Keens, MD;  Location: [place];  Service: General;  Laterality: N/A;  . CARPAL TUNNEL RELEASE     rt hand  . COLONOSCOPY WITH PROPOFOL N/A 03/17/2018  Procedure: COLONOSCOPY WITH PROPOFOL;  Surgeon: Doran Stabler, MD;  Location: [facility];  Service: Gastroenterology;  Laterality: N/A;  . ENTEROSCOPY N/A 12/04/2012   Procedure: ENTEROSCOPY;  Surgeon: Beryle Beams, MD;  Location: [facility];  Service: Endoscopy;  Laterality: N/A;  . ENTEROSCOPY N/A 12/18/2012   Procedure: ENTEROSCOPY;  Surgeon: Beryle Beams, MD;  Location: [facility];  Service: Endoscopy;  Laterality: N/A;  . ENTEROSCOPY N/A 02/25/2015   Procedure: ENTEROSCOPY;  Surgeon: Inda Castle, MD;  Location: [facility];  Service: Endoscopy;  Laterality: N/A;  . ENTEROSCOPY N/A 10/29/2017   Procedure: ENTEROSCOPY;  Surgeon: Doran Stabler, MD;  Location: [facility];  Service: Gastroenterology;  Laterality: N/A;  .  ESOPHAGOGASTRODUODENOSCOPY  05/05/2011   Procedure: ESOPHAGOGASTRODUODENOSCOPY (EGD);  Surgeon: Zenovia Jarred, MD;  Location: [facility];  Service: Gastroenterology;  Laterality: N/A;  Dr. Hilarie Fredrickson will do procedure for Dr. Benson Norway Saturday.  . ESOPHAGOGASTRODUODENOSCOPY  05/08/2011   Procedure: ESOPHAGOGASTRODUODENOSCOPY (EGD);  Surgeon: Beryle Beams;  Location: [facility];  Service: Endoscopy;  Laterality: N/A;  . ESOPHAGOGASTRODUODENOSCOPY  06/07/2011   Procedure: ESOPHAGOGASTRODUODENOSCOPY (EGD);  Surgeon: Beryle Beams, MD;  Location: [facility];  Service: Endoscopy;  Laterality: N/A;  . ESOPHAGOGASTRODUODENOSCOPY  12/20/2011   Procedure: ESOPHAGOGASTRODUODENOSCOPY (EGD);  Surgeon: Beryle Beams, MD;  Location: [facility];  Service: Endoscopy;  Laterality: N/A;  . EYE SURGERY Bilateral    cataracts  . FLEXIBLE SIGMOIDOSCOPY  12/21/2011   Procedure: FLEXIBLE SIGMOIDOSCOPY;  Surgeon: Beryle Beams, MD;  Location: [facility];  Service: Endoscopy;  Laterality: N/A;  . HOT HEMOSTASIS  06/07/2011   Procedure: HOT HEMOSTASIS (ARGON PLASMA COAGULATION/BICAP);  Surgeon: Beryle Beams, MD;  Location: [facility];  Service: Endoscopy;  Laterality: N/A;  . INCISIONAL HERNIA REPAIR N/A 06/01/2014   Procedure: ATTEMPTED LAPAROSCOPIC AND OPEN INCISIONAL HERNIA REPAIR WITH MESH;  Surgeon: Coralie Keens, MD;  Location: [place];  Service: General;  Laterality: N/A;  . INSERTION OF MESH N/A 06/01/2014   Procedure: INSERTION OF MESH;  Surgeon: Coralie Keens, MD;  Location: [place];  Service: General;  Laterality: N/A;  . LAPAROTOMY  02/16/2012   Procedure: EXPLORATORY LAPAROTOMY;  Surgeon: Edward Jolly, MD;  Location: [facility];  Service: General;  Laterality: N/A;  oversewing of anastomotic leak and rigid sigmoidoscopy  . LAPAROTOMY N/A 06/21/2014   Procedure: ABDOMINAL WOUND EXPLORATION;  Surgeon: Coralie Keens, MD;  Location: [place];  Service: General;  Laterality: N/A;  . LEG AMPUTATION ABOVE KNEE      left  . PARTIAL COLECTOMY  02/15/2012   Procedure: PARTIAL COLECTOMY;  Surgeon: Harl Bowie, MD;  Location: [facility];  Service: General;  Laterality: N/A;  . TONSILLECTOMY      Medications: Prior to Admission medications   Medication Sig Start Date End Date Taking? Authorizing Provider  ACCU-CHEK FASTCLIX LANCETS MISC Use to test blood glucose 3 times daily. 10/04/16   [name], MD  allopurinol (ZYLOPRIM) 100 MG tablet TAKE 1 TABLET (100 MG TOTAL) BY MOUTH DAILY. 10/23/19   Jose Persia, MD  amLODipine (NORVASC) 10 MG tablet Take 1 tablet (10 mg total) by mouth daily. 02/08/20   Jean Rosenthal, MD  aspirin EC 81 MG tablet Take 1 tablet (81 mg total) by mouth daily. 03/04/15   Burgess Estelle, MD  Blood Glucose Monitoring Suppl (ACCU-CHEK GUIDE) w/Device KIT 1 each by Does not apply route 3 (three) times daily. The patient is insulin requiring, ICD 10 code E11.65. The patient  tests 3 times a day. 02/05/19   Jose Persia, MD  cetirizine (ZYRTEC) 10 MG tablet Take 1 tablet (10 mg total) by mouth daily. 02/12/20   Jean Rosenthal, MD  cholecalciferol (VITAMIN D) 1000 units tablet Take 1 tablet (1,000 Units total) by mouth daily. 10/04/16   [name], MD  colchicine 0.6 MG tablet TAKE 1 TABLET (0.6 MG TOTAL) BY MOUTH DAILY. Patient taking differently: Take 0.6 mg by mouth as needed. 03/14/20   Harvie Heck, MD  Dulaglutide (TRULICITY) 8.91 QX/4.5WT SOPN Inject 0.5 mLs (0.75 mg total) into the skin once a week. 11/02/19   Asencion Noble, MD  feeding supplement, GLUCERNA SHAKE, (GLUCERNA SHAKE) LIQD Take 237 mLs by mouth 3 (three) times daily between meals. Patient taking differently: Take 237 mLs by mouth 2 (two) times daily between meals. 10/04/16   [name], MD  gabapentin (NEURONTIN) 400 MG capsule Take 1-2 capsules (400-800 mg total) by mouth See admin instructions. Take 1 capsule ($RemoveBe'400mg'apgQJOFQv$ ) in the morning, 1 capsule ($RemoveBe'400mg'jlYDaMzYs$ ) in the afternoon and 2  Capsules ($RemoveBe'800mg'pohdOStcD$ ) before  bedtime 03/14/20 06/12/20  Aslam, Loralyn Freshwater, MD  glucose blood (ACCU-CHEK GUIDE) test strip USE TO TEST BLOOD SUGAR THREE TIMES A DAY 06/17/20   Harvie Heck, MD  losartan (COZAAR) 100 MG tablet Take 1 tablet (100 mg total) by mouth daily. 10/14/19   Jose Persia, MD  metFORMIN (GLUCOPHAGE) 1000 MG tablet TAKE 1 TABLET BY MOUTH TWICE A DAY WITH A MEAL 04/14/20   Katsadouros, Vasilios, MD  omeprazole (PRILOSEC) 20 MG capsule Take 1 capsule (20 mg total) by mouth daily. 04/18/20   Jose Persia, MD    Allergies:   Allergies  Allergen Reactions  . Ciprofloxacin Hives and Swelling  . Morphine And Related Other (See Comments)    Makes pt sad and paranoid  . Penicillins Hives and Swelling    Has patient had a PCN reaction causing immediate rash, facial/tongue/throat swelling, SOB or lightheadedness with hypotension:YES Has patient had a PCN reaction causing severe rash involving mucus membranes or skin necrosis:UNSURE Has patient had a PCN reaction that required hospitalization:YES Has patient had a PCN reaction occurring within the last 10 years:No If all of the above answers are "NO", then may proceed with Cephalosporin use.   . Codeine Hives and Swelling  . Lisinopril Cough    New onset dry cough after starting lisinopril. Resolved upon switching to losartan.  Shirlean Kelly [Ferumoxytol] Itching    Tolerated Feraheme 6/26 & 7/21 pre-medications prior to medication    Social History:  reports that she has been smoking cigarettes. She has a 8.50 pack-year smoking history. She has never used smokeless tobacco. She reports current alcohol use. She reports current drug use. Drugs: Cocaine and Marijuana.  Family History: Family History  Problem Relation Age of Onset  . Cancer Father        unknown  . Hypertension Mother   . Diverticulitis Mother      Objective   Physical Exam: There were no vitals taken for this visit.  Physical Exam Vitals and nursing note reviewed.   Constitutional:      Appearance: Normal appearance.  HENT:     Head: Normocephalic and atraumatic.  Eyes:     Conjunctiva/sclera: Conjunctivae normal.  Cardiovascular:     Rate and Rhythm: Normal rate and regular rhythm.     Heart sounds: Murmur heard.    Pulmonary:     Effort: Pulmonary effort is normal.     Breath sounds: Normal  breath sounds.  Abdominal:     General: Abdomen is flat.     Palpations: Abdomen is soft.     Tenderness: There is abdominal tenderness.  Musculoskeletal:        General: No swelling or tenderness.  Skin:    Coloration: Skin is not jaundiced or pale.  Neurological:     Mental Status: She is alert. Mental status is at baseline.  Psychiatric:        Mood and Affect: Mood normal.        Behavior: Behavior normal.     LABS on Admission: I have personally reviewed all the labs and imaging below    Basic Metabolic Panel: No results for input(s): NA, K, CL, CO2, GLUCOSE, BUN, CREATININE, CALCIUM, MG, PHOS in the last 168 hours. Liver Function Tests: No results for input(s): AST, ALT, ALKPHOS, BILITOT, PROT, ALBUMIN in the last 168 hours. No results for input(s): LIPASE, AMYLASE in the last 168 hours. No results for input(s): AMMONIA in the last 168 hours. CBC: Recent Labs  Lab 07/04/20 1009 07/07/20 1155  WBC 7.3 8.9  NEUTROABS 5.4 7.3  HGB 7.8* 7.7*  HCT 25.2* 24.5*  MCV 104.1* 103.8*  PLT 333 340   Cardiac Enzymes: No results for input(s): CKTOTAL, CKMB, CKMBINDEX, TROPONINI in the last 168 hours. BNP: Invalid input(s): POCBNP CBG: No results for input(s): GLUCAP in the last 168 hours.  Radiological Exams on Admission:  No results found.    EKG: Not done   A & P   Principal Problem:   GI bleed Active Problems:   Essential hypertension, benign   Type 2 diabetes mellitus with diabetic neuropathy (HCC)   Iron deficiency anemia due to chronic blood loss   1. Acute on chronic anemia likely secondary to GI bleed with  history of AVMs and iron deficiency a. Hb 7.7 today, baseline 10 b. Hold home aspirin c. 1 unit PRBCs after discussion with Dr. Burr Medico.  Hb goal > 10 per oncology note today d. GI consulted e. will make n.p.o. pending GI recommendations  2. Type 2 diabetes with neuropathy a. On Trulicity and Metformin outpatient, holding for now b. sliding scale while inpatient  3. Hypertension, stable a. Holding home meds as she is n.p.o. b. As needed Lopressor for now  4. PVD a. Holding home aspirin  5. Gout, currently not in acute flare a. On allopurinol and colchicine outpatient   DVT prophylaxis: SCD   Code Status: Prior  Diet: N.p.o. Family Communication: Admission, patients condition and plan of care including tests being ordered have been discussed with the patient who indicates understanding and agrees with the plan and Code Status.  Disposition Plan: The appropriate patient status for this patient is OBSERVATION. Observation status is judged to be reasonable and necessary in order to provide the required intensity of service to ensure the patient's safety. The patient's presenting symptoms, physical exam findings, and initial radiographic and laboratory data in the context of their medical condition is felt to place them at decreased risk for further clinical deterioration. Furthermore, it is anticipated that the patient will be medically stable for discharge from the hospital within 2 midnights of admission. The following factors support the patient status of observation.   " The patient's presenting symptoms include abnormal lab work. " The physical exam findings include unremarkable. " The initial radiographic and laboratory data are anemia.     Dispo: The patient is from: Home  Anticipated d/c is to: Home              Anticipated d/c date is: 1 day              Patient currently is not medically stable to d/c.   Difficult to place patient No         Consultants   . GI  Procedures  . 1 unit PRBCs  Time Spent on Admission: 66 minutes    Harold Hedge, DO [facility]  07/07/2020, 2:32 PM

## 2020-07-07 NOTE — Progress Notes (Signed)
RN contacted me. Patient going to leave AMA if she cannot have regular diet. I put her on clears because there was food residue in stomach on last enteroscopy. She can have a regular diet, NPO after MN. Hopefully stomach will be empty since her procedure is not until tomorrow at 2pm

## 2020-07-07 NOTE — Progress Notes (Signed)
[place]   Telephone:([phone]) [phone] Fax:(336) [phone]   Clinic Follow up Note   Patient Care Team: Jose Persia, MD as PCP - Evalina Field, MD as Consulting Physician (Ophthalmology) Coralie Keens, MD as Consulting Physician (General Surgery) Truitt Merle, MD as Consulting Physician (Hematology) Comer, Okey Regal, MD as Consulting Physician (Infectious Diseases) 07/07/2020  CHIEF COMPLAINT: Fatigue and melena  CURRENT THERAPY: IV iron and blood transfusions as needed  INTERVAL HISTORY: 70 year old female with no history of GI bleeding, iron deficiency, coming in today for follow-up and possible hospital admission.  I saw her a week ago, she started having intermittent around that time and labs showed worsening anemia.  I made urgent referral and she saw her gastroenterologist Dr. Simona Huh 2 days ago.  Dr. Simona Huh recommended hospital admission GI bleeding work-up, she was not able to go to ED 2 days ago, due to her sister's funeral yesterday.  I scheduled for lab and follow-up in my office today, to see if she can be admitted directly to [facility].   She has been having 1-2 black stool every day for the past week, with large amount in morning. No N/V or other GI symptoms. She denies chest pain, but has moderate fatigue, and is still able to do her routine activities at home.    All other systems were reviewed with the patient and are negative.  MEDICAL HISTORY:  Past Medical History:  Diagnosis Date  . Allergic rhinitis   . Anemia, iron deficiency 05/03/2011   2/2 GI AVMs - recieves iron infusions as well as periodic red blood cell transfusions  . Anxiety   . Arteriovenous malformation of duodenum 02/25/2015  . CARPAL TUNNEL RELEASE, HX OF 07/22/2008  . Chest pain 10/28/2019  . Colon polyps   . Diabetes mellitus without complication ([place])    type 2  . Difficulty sleeping    takes trazadone for sleep  . Diverticulosis   . Fall 01/03/2020  . GERD  (gastroesophageal reflux disease)   . Hepatitis C    in SVR with Harvoni 12/2014-02/2015.   Marland Kitchen History of hernia repair 08/18/2012  . History of porphyria 11/30/2014   Porphyria cutanea tarda in setting of chronic Hepatitis C, frequent  IV iron infusions, and alcohol abuse   . Hypertension   . Hypocalcemia 10/28/2019  . Hypokalemia 09/27/2014  . Peripheral vascular disease ([place])   . Stroke [facility]) 2010   no residual deficits  . Substance use disorder    cocaine  . Tachycardia 07/10/2017  . Trichomonas vaginitis 04/16/2019    SURGICAL HISTORY: Past Surgical History:  Procedure Laterality Date  . ABDOMINAL HYSTERECTOMY    . APPLICATION OF WOUND VAC N/A 06/21/2014   Procedure: APPLICATION OF WOUND VAC;  Surgeon: Coralie Keens, MD;  Location: [place];  Service: General;  Laterality: N/A;  . CARPAL TUNNEL RELEASE     rt hand  . COLONOSCOPY WITH PROPOFOL N/A 03/17/2018   Procedure: COLONOSCOPY WITH PROPOFOL;  Surgeon: Doran Stabler, MD;  Location: [facility];  Service: Gastroenterology;  Laterality: N/A;  . ENTEROSCOPY N/A 12/04/2012   Procedure: ENTEROSCOPY;  Surgeon: Beryle Beams, MD;  Location: [facility];  Service: Endoscopy;  Laterality: N/A;  . ENTEROSCOPY N/A 12/18/2012   Procedure: ENTEROSCOPY;  Surgeon: Beryle Beams, MD;  Location: [facility];  Service: Endoscopy;  Laterality: N/A;  . ENTEROSCOPY N/A 02/25/2015   Procedure: ENTEROSCOPY;  Surgeon: Inda Castle, MD;  Location: [facility] ENDOSCOPY;  Service: Endoscopy;  Laterality: N/A;  . ENTEROSCOPY N/A 10/29/2017   Procedure: ENTEROSCOPY;  Surgeon: Doran Stabler, MD;  [facility];  Service: Gastroenterology;  Laterality: N/A;  . ESOPHAGOGASTRODUODENOSCOPY  05/05/2011   Procedure: ESOPHAGOGASTRODUODENOSCOPY (EGD);  Surgeon: Zenovia Jarred, MD;  Location: [facility];  Service: Gastroenterology;  Laterality: N/A;  Dr. Hilarie Fredrickson will do procedure for Dr. Benson Norway Saturday.  . ESOPHAGOGASTRODUODENOSCOPY  05/08/2011   Procedure:  ESOPHAGOGASTRODUODENOSCOPY (EGD);  Surgeon: Beryle Beams;  [facility];  Service: Endoscopy;  Laterality: N/A;  . ESOPHAGOGASTRODUODENOSCOPY  06/07/2011   Procedure: ESOPHAGOGASTRODUODENOSCOPY (EGD);  Surgeon: Beryle Beams, MD;  Location: [facility];  Service: Endoscopy;  Laterality: N/A;  . ESOPHAGOGASTRODUODENOSCOPY  12/20/2011   Procedure: ESOPHAGOGASTRODUODENOSCOPY (EGD);  Surgeon: Beryle Beams, MD;  Location: [facility];  Service: Endoscopy;  Laterality: N/A;  . EYE SURGERY Bilateral    cataracts  . FLEXIBLE SIGMOIDOSCOPY  12/21/2011   Procedure: FLEXIBLE SIGMOIDOSCOPY;  Surgeon: Beryle Beams, MD;  [facility];  Service: Endoscopy;  Laterality: N/A;  . HOT HEMOSTASIS  06/07/2011   Procedure: HOT HEMOSTASIS (ARGON PLASMA COAGULATION/BICAP);  Surgeon: Beryle Beams, MD;  Location: [facility];  Service: Endoscopy;  Laterality: N/A;  . INCISIONAL HERNIA REPAIR N/A 06/01/2014   Procedure: ATTEMPTED LAPAROSCOPIC AND OPEN INCISIONAL HERNIA REPAIR WITH MESH;  Surgeon: Coralie Keens, MD;  Location: [place];  Service: General;  Laterality: N/A;  . INSERTION OF MESH N/A 06/01/2014   Procedure: INSERTION OF MESH;  Surgeon: Coralie Keens, MD;  Location: [place];  Service: General;  Laterality: N/A;  . LAPAROTOMY  02/16/2012   Procedure: EXPLORATORY LAPAROTOMY;  Surgeon: Edward Jolly, MD;  Location: [facility];  Service: General;  Laterality: N/A;  oversewing of anastomotic leak and rigid sigmoidoscopy  . LAPAROTOMY N/A 06/21/2014   Procedure: ABDOMINAL WOUND EXPLORATION;  Surgeon: Coralie Keens, MD;  Location: [place];  Service: General;  Laterality: N/A;  . LEG AMPUTATION ABOVE KNEE     left  . PARTIAL COLECTOMY  02/15/2012   Procedure: PARTIAL COLECTOMY;  Surgeon: Harl Bowie, MD;  Location: [facility];  Service: General;  Laterality: N/A;  . TONSILLECTOMY      I have reviewed the social history and family history with the patient and they are unchanged from  previous note.  ALLERGIES:  is allergic to ciprofloxacin, morphine and related, penicillins, codeine, lisinopril, and feraheme [ferumoxytol].  MEDICATIONS:  Current Outpatient Medications  Medication Sig Dispense Refill  . ACCU-CHEK FASTCLIX LANCETS MISC Use to test blood glucose 3 times daily. 100 each 11  . allopurinol (ZYLOPRIM) 100 MG tablet TAKE 1 TABLET (100 MG TOTAL) BY MOUTH DAILY. 30 tablet 5  . amLODipine (NORVASC) 10 MG tablet Take 1 tablet (10 mg total) by mouth daily. 90 tablet 3  . aspirin EC 81 MG tablet Take 1 tablet (81 mg total) by mouth daily. 30 tablet 1  . Blood Glucose Monitoring Suppl (ACCU-CHEK GUIDE) w/Device KIT 1 each by Does not apply route 3 (three) times daily. The patient is insulin requiring, ICD 10 code E11.65. The patient tests 3 times a day. 1 kit 1  . cetirizine (ZYRTEC) 10 MG tablet Take 1 tablet (10 mg total) by mouth daily. 30 tablet 0  . cholecalciferol (VITAMIN D) 1000 units tablet Take 1 tablet (1,000 Units total) by mouth daily. 100 tablet 2  . colchicine 0.6 MG tablet TAKE 1 TABLET (0.6 MG TOTAL) BY MOUTH DAILY. (Patient taking differently: Take 0.6 mg by mouth as needed.)  30 tablet 1  . Dulaglutide (TRULICITY) 8.11 BJ/4.7WG SOPN Inject 0.5 mLs (0.75 mg total) into the skin once a week. 6 mL 6  . feeding supplement, GLUCERNA SHAKE, (GLUCERNA SHAKE) LIQD Take 237 mLs by mouth 3 (three) times daily between meals. (Patient taking differently: Take 237 mLs by mouth 2 (two) times daily between meals.) 90 Can 0  . gabapentin (NEURONTIN) 400 MG capsule Take 1-2 capsules (400-800 mg total) by mouth See admin instructions. Take 1 capsule ($RemoveBe'400mg'RlFiXGlpo$ ) in the morning, 1 capsule ($RemoveBe'400mg'EuhlIaozm$ ) in the afternoon and 2  Capsules ($RemoveBe'800mg'axUaALqis$ ) before bedtime 180 capsule 3  . glucose blood (ACCU-CHEK GUIDE) test strip USE TO TEST BLOOD SUGAR THREE TIMES A DAY 100 strip 3  . losartan (COZAAR) 100 MG tablet Take 1 tablet (100 mg total) by mouth daily. 90 tablet 3  . metFORMIN (GLUCOPHAGE)  1000 MG tablet TAKE 1 TABLET BY MOUTH TWICE A DAY WITH A MEAL 90 tablet 6  . omeprazole (PRILOSEC) 20 MG capsule Take 1 capsule (20 mg total) by mouth daily. 90 capsule 1   No current facility-administered medications for this visit.    PHYSICAL EXAMINATION: ECOG PERFORMANCE STATUS: 2 - Symptomatic, <50% confined to bed  Vitals:   07/07/20 1223  BP: 118/60  Pulse: 96  Resp: 17  Temp: 98.8 F (37.1 C)  SpO2: 99%   Filed Weights    GENERAL:alert, no distress and comfortable SKIN: skin color, texture, turgor are normal, no rashes or significant lesions EYES: normal, Conjunctiva are pink and non-injected, sclera clear OROPHARYNX:no exudate, no erythema and lips, buccal mucosa, and tongue normal  NECK: supple, thyroid normal size, non-tender, without nodularity LYMPH:  no palpable lymphadenopathy in the cervical, axillary or inguinal LUNGS: clear to auscultation and percussion with normal breathing effort HEART: regular rate & rhythm and no murmurs and no lower extremity edema ABDOMEN:abdomen soft, non-tender and normal bowel sounds Musculoskeletal:no cyanosis of digits and no clubbing  NEURO: alert & oriented x 3 with fluent speech, no focal motor/sensory deficits  LABORATORY DATA:  I have reviewed the data as listed CBC Latest Ref Rng & Units 07/07/2020 07/04/2020 06/29/2020  WBC 4.0 - 10.5 K/uL 8.9 7.3 8.8  Hemoglobin 12.0 - 15.0 g/dL 7.7(L) 7.8(L) 8.1(L)  Hematocrit 36.0 - 46.0 % 24.5(L) 25.2(L) 24.8(L)  Platelets 150 - 400 K/uL 340 333 430(H)     CMP Latest Ref Rng & Units 04/27/2020 04/06/2020 03/23/2020  Glucose 70 - 99 mg/dL 105(H) 127(H) 133(H)  BUN 8 - 23 mg/dL $Remove'15 14 15  'GusiOIE$ Creatinine 0.44 - 1.00 mg/dL 1.17(H) 1.18(H) 1.14(H)  Sodium 135 - 145 mmol/L 140 142 143  Potassium 3.5 - 5.1 mmol/L 4.0 3.7 3.3(L)  Chloride 98 - 111 mmol/L 106 103 106  CO2 22 - 32 mmol/L $RemoveB'25 29 29  'WGArBKCC$ Calcium 8.9 - 10.3 mg/dL 8.6(L) 8.9 8.6(L)  Total Protein 6.5 - 8.1 g/dL 6.6 6.7 6.3(L)  Total  Bilirubin 0.3 - 1.2 mg/dL 0.2(L) 0.3 <0.2(L)  Alkaline Phos 38 - 126 U/L 64 70 63  AST 15 - 41 U/L $Remo'18 17 17  'BayzR$ ALT 0 - 44 U/L $Remo'11 10 11      'VFqxv$ RADIOGRAPHIC STUDIES: I have personally reviewed the radiological images as listed and agreed with the findings in the report. No results found.   ASSESSMENT & PLAN:   [name] is a 70 y.o. female with   1. Iron deficiency anemia secondary to GIbloodloss,andAnemia of Chronic Disease  -History of multiple GI bleeding in 2016, required  blood transfusion, hospitalization and IV Feraheme -She had an EGD and colonoscopy on 10/29/2017 and 03/17/2018 with Dr. Loletha Carrow, which was normal.  -She endoscopy in [facility] and was found to have AVM in small bowel.  -She has been on oral iron for 5-6 years now and has hadintermittentblack watery stool.  -She has been receiving IV Feraheme every1-48month on average lately.Goal is to keep her Hg >10and ferritin>100.Her last IV Feraheme was on 06/29/2020 -Pt now has melena for the past week, with Hg 7.7 today.  -Seen by Dr. Loletha Carrow two days ago, who recommends hospital admission for [facility] nuclear study, and capsule endoscopy -I spoke with hospitalist Dr. Neysa Bonito today, who has kindly agreed to admit her directly   2. Porphyria cutaneoustarda (PCT), resolvedafter Hep C Treatment  3.Chronic hepatitis C infection -She tested positive for hepatitis C in 2009. She has previously completed treatment for hepatitis C in 2016 and repeated test was negative.  -She had an EGD and colonoscopy on 10/29/2017 and 03/17/2018 with Dr. Loletha Carrow, which was normal. Dorothyann Gibbs continue to follow up with him. -05/2018 liver US was unremarkable. -She gets 2 flu shots a year (March and October) and she received her COVID booster in 02/2020.  4. DM2 -Management per PCP's office.  5. LeftAKA, hasprosthesis, she is wheelchair bound most of time, Right LE Edema  -LE edemacould be related to nutritional deficiencies or  decreased venous return. I encouraged her to elevateher right leg.  -On 12/30/19 she had a mechanical fall,resulting insignificant tenderness in the left elbow and left stump at amputation above knee. There were no fractures on her 01/01/20 Xray. -For pain she has been on Oxycodone q6hours. Her left shoulder pain has improved but her left stump pain mostly stable. -She notes she has been using crutches more lately and is working towards walking independently.She does not use power chair when she uses [employer].    Plan -Lab reviewed. Hg 7.7, she will be admitted to [facility] hospital for GI bleeding workup, I spoke with Dr. Neysa Bonito -blood transfusion 2u  -please consult [facility] after admission.    No problem-specific Assessment & Plan notes found for this encounter.   No orders of the defined types were placed in this encounter.   All questions were answered. The patient knows to call the clinic with any problems, questions or concerns. No barriers to learning was detected. I spent 20 minutes counseling the patient face to face. The total time spent in the appointment was 30 minutes and more than 50% was on counseling and review of test results     Truitt Merle, MD 07/07/20

## 2020-07-07 NOTE — H&P (View-Only) (Signed)
Referring Provider:  [facility]         Primary Care Physician:  Jose Persia, MD Primary Gastroenterologist:  Wilfrid Lund, MD             We were asked to see this patient for:  GI bleed                ASSESSMENT / PLAN:   # 70 yo female with acute on chronic anemia and black stools,  most likely from known small bowel AVMs. Multiple endoscopic evaluations through the years.  Hematology has been managing her IDA with periodic Fe+ infusions. Hgb had been stable in mid 9 to mid 10 range, now 7.7 .  --Last small bowel enteroscopy in 2019, small bowel AVMs not found within reach of enteroscopy. She will need repeat small bowel enteroscopy. The risks and benefits of enteroscopy were discussed and the patient agrees to proceed. This will be done tomorrow at 2pm.    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   # HTN  # Hx of splenic hematoma, incidental finding in June 2021. PCP is following and to schedule for follow up CT scan to ensure no changes    HPI:                                                                                                                             Chief Complaint:  Black stools, recurrent anemia  [name] is a 70 y.o. female with hx of IDA / chronic small bowel AVM blood loss left AKA, DM, splenic hematoma, HTN, HCV (treated)   Patient has a history chronic GI blood. She has had extensive endoscopic evaluation and chronic blood loss felt to be secondary to small bowel AVMs. She is followed by hematology for iron deficiency anemia and gets periodic iron infusions and blood transfusions.  She is apparently not on oral iron.  Patient was seen in our office 2 days ago for evaluation of anemia and melena.  She was significantly fatigued.  Dr. Loletha Carrow recommended ED with hospital admission.  She was not able to go to the ED due to her sister's funeral yesterday.  She was sent to the hospital today by her oncologist.  Hemoglobin is  7.7, down from 10 in mid January.  Ms Gruenberg has been having black stools for about two weeks. She has no abdominal pain, nausea nor vomiting. She endorses fatigue. No chest pain. She takes a baby asa, no other NSAIDs. No epistaxis, no vaginal bleeding. She does report hematuria but says she gets that anytime she has a GI bleed. Patient is very frustrated at this ongoing problem of chronic GI blood loss.    PREVIOUS ENDOSCOPIC EVALUATIONS / PERTINENT STUDIES   Multiple endoscopies for GI bleed. Most recent ones below Oct 2019 colonoscopy -Quality of prep good after lavage -Decreased sphincter tone found on digital rectal exam. - Patent end-to-side colo-colonic anastomosis, characterized  by healthy appearing mucosa. - The examination was otherwise normal on direct and retroflexion views.multiple   June 2019 EGD  --Normal esophagus. - A medium amount of food (residue) in the stomach. - Normal examined duodenum. - The examined portion of the jejunum was normal. - No specimens collected. No SB AVMs seen.   Past Medical History:  Diagnosis Date  . Allergic rhinitis   . Anemia, iron deficiency 05/03/2011   2/2 GI AVMs - recieves iron infusions as well as periodic red blood cell transfusions  . Anxiety   . Arteriovenous malformation of duodenum 02/25/2015  . CARPAL TUNNEL RELEASE, HX OF 07/22/2008  . Chest pain 10/28/2019  . Colon polyps   . Diabetes mellitus without complication (HCC)    type 2  . Difficulty sleeping    takes trazadone for sleep  . Diverticulosis   . Fall 01/03/2020  . GERD (gastroesophageal reflux disease)   . Hepatitis C    in SVR with Harvoni 12/2014-02/2015.   . History of hernia repair 08/18/2012  . History of porphyria 11/30/2014   Porphyria cutanea tarda in setting of chronic Hepatitis C, frequent  IV iron infusions, and alcohol abuse   . Hypertension   . Hypocalcemia 10/28/2019  . Hypokalemia 09/27/2014  . Peripheral vascular disease (HCC)   . Stroke (HCC)  2010   no residual deficits  . Substance use disorder    cocaine  . Tachycardia 07/10/2017  . Trichomonas vaginitis 04/16/2019    Past Surgical History:  Procedure Laterality Date  . ABDOMINAL HYSTERECTOMY    . APPLICATION OF WOUND VAC N/A 06/21/2014   Procedure: APPLICATION OF WOUND VAC;  Surgeon: Douglas Blackman, MD;  Location: [facility] OR;  Service: General;  Laterality: N/A;  . CARPAL TUNNEL RELEASE     rt hand  . COLONOSCOPY WITH PROPOFOL N/A 03/17/2018   Procedure: COLONOSCOPY WITH PROPOFOL;  Surgeon: Danis, Henry L III, MD;  Location: [facility];  Service: Gastroenterology;  Laterality: N/A;  . ENTEROSCOPY N/A 12/04/2012   Procedure: ENTEROSCOPY;  Surgeon: Patrick D Hung, MD;  Location: [facility];  Service: Endoscopy;  Laterality: N/A;  . ENTEROSCOPY N/A 12/18/2012   Procedure: ENTEROSCOPY;  Surgeon: Patrick D Hung, MD;  Location: [facility];  Service: Endoscopy;  Laterality: N/A;  . ENTEROSCOPY N/A 02/25/2015   Procedure: ENTEROSCOPY;  Surgeon: Robert D Kaplan, MD;  Location: [facility];  Service: Endoscopy;  Laterality: N/A;  . ENTEROSCOPY N/A 10/29/2017   Procedure: ENTEROSCOPY;  Surgeon: Danis, Henry L III, MD;  Location: [facility];  Service: Gastroenterology;  Laterality: N/A;  . ESOPHAGOGASTRODUODENOSCOPY  05/05/2011   Procedure: ESOPHAGOGASTRODUODENOSCOPY (EGD);  Surgeon: Jay Pyrtle, MD;  Location: [facility];  Service: Gastroenterology;  Laterality: N/A;  Dr. Pyrtle will do procedure for Dr. Hung Saturday.  . ESOPHAGOGASTRODUODENOSCOPY  05/08/2011   Procedure: ESOPHAGOGASTRODUODENOSCOPY (EGD);  Surgeon: Patrick D Hung;  Location: [facility];  Service: Endoscopy;  Laterality: N/A;  . ESOPHAGOGASTRODUODENOSCOPY  06/07/2011   Procedure: ESOPHAGOGASTRODUODENOSCOPY (EGD);  Surgeon: Patrick D Hung, MD;  Location: [facility];  Service: Endoscopy;  Laterality: N/A;  . ESOPHAGOGASTRODUODENOSCOPY  12/20/2011   Procedure: ESOPHAGOGASTRODUODENOSCOPY (EGD);  Surgeon: Patrick D  Hung, MD;  Location: [facility];  Service: Endoscopy;  Laterality: N/A;  . EYE SURGERY Bilateral    cataracts  . FLEXIBLE SIGMOIDOSCOPY  12/21/2011   Procedure: FLEXIBLE SIGMOIDOSCOPY;  Surgeon: Patrick D Hung, MD;  Location: [facility];  Service: Endoscopy;  Laterality: N/A;  . HOT HEMOSTASIS  06/07/2011   Procedure: HOT HEMOSTASIS (ARGON   PLASMA COAGULATION/BICAP);  Surgeon: Patrick D Hung, MD;  Location: [facility];  Service: Endoscopy;  Laterality: N/A;  . INCISIONAL HERNIA REPAIR N/A 06/01/2014   Procedure: ATTEMPTED LAPAROSCOPIC AND OPEN INCISIONAL HERNIA REPAIR WITH MESH;  Surgeon: Douglas Blackman, MD;  Location: [facility];  Service: General;  Laterality: N/A;  . INSERTION OF MESH N/A 06/01/2014   Procedure: INSERTION OF MESH;  Surgeon: Douglas Blackman, MD;  Location: [facility];  Service: General;  Laterality: N/A;  . LAPAROTOMY  02/16/2012   Procedure: EXPLORATORY LAPAROTOMY;  Surgeon: Benjamin T Hoxworth, MD;  Location: [facility];  Service: General;  Laterality: N/A;  oversewing of anastomotic leak and rigid sigmoidoscopy  . LAPAROTOMY N/A 06/21/2014   Procedure: ABDOMINAL WOUND EXPLORATION;  Surgeon: Douglas Blackman, MD;  Location: [facility];  Service: General;  Laterality: N/A;  . LEG AMPUTATION ABOVE KNEE     left  . PARTIAL COLECTOMY  02/15/2012   Procedure: PARTIAL COLECTOMY;  Surgeon: Douglas A Blackman, MD;  Location: [facility];  Service: General;  Laterality: N/A;  . TONSILLECTOMY      Prior to Admission medications   Medication Sig Start Date End Date Taking? Authorizing Provider  acetaminophen (TYLENOL) 500 MG tablet Take 1,000 mg by mouth every 6 (six) hours as needed.   Yes [provider]  allopurinol (ZYLOPRIM) 100 MG tablet TAKE 1 TABLET (100 MG TOTAL) BY MOUTH DAILY. 10/23/19  Yes Basaraba, Iulia, MD  amLODipine (NORVASC) 10 MG tablet Take 1 tablet (10 mg total) by mouth daily. 02/08/20  Yes Agyei, Obed K, MD  aspirin EC 81 MG tablet Take 1 tablet (81 mg total) by mouth daily.  03/04/15  Yes Saraiya, Parth, MD  cetirizine (ZYRTEC) 10 MG tablet Take 1 tablet (10 mg total) by mouth daily. 02/12/20  Yes Agyei, Obed K, MD  cholecalciferol (VITAMIN D) 1000 units tablet Take 1 tablet (1,000 Units total) by mouth daily. 10/04/16  Yes Svalina, Gorica, MD  colchicine 0.6 MG tablet TAKE 1 TABLET (0.6 MG TOTAL) BY MOUTH DAILY. Patient taking differently: Take 0.6 mg by mouth as needed (gout). 03/14/20  Yes Aslam, Sadia, MD  Dulaglutide (TRULICITY) 0.75 MG/0.5ML SOPN Inject 0.5 mLs (0.75 mg total) into the skin once a week. 11/02/19  Yes Krienke, Marissa M, MD  feeding supplement, GLUCERNA SHAKE, (GLUCERNA SHAKE) LIQD Take 237 mLs by mouth 3 (three) times daily between meals. Patient taking differently: Take 237 mLs by mouth 2 (two) times daily between meals. 10/04/16  Yes Svalina, Gorica, MD  gabapentin (NEURONTIN) 400 MG capsule Take 1-2 capsules (400-800 mg total) by mouth See admin instructions. Take 1 capsule (400mg) in the morning, 1 capsule (400mg) in the afternoon and 2  Capsules (800mg) before bedtime Patient taking differently: Take 400-800 mg by mouth 3 (three) times daily. 03/14/20 06/12/20 Yes Aslam, Sadia, MD  losartan (COZAAR) 100 MG tablet Take 1 tablet (100 mg total) by mouth daily. 10/14/19  Yes Basaraba, Iulia, MD  metFORMIN (GLUCOPHAGE) 1000 MG tablet TAKE 1 TABLET BY MOUTH TWICE A DAY WITH A MEAL Patient taking differently: Take 1,000 mg by mouth daily. 04/14/20  Yes Katsadouros, Vasilios, MD  omeprazole (PRILOSEC) 20 MG capsule Take 1 capsule (20 mg total) by mouth daily. 04/18/20  Yes Basaraba, Iulia, MD  ACCU-CHEK FASTCLIX LANCETS MISC Use to test blood glucose 3 times daily. 10/04/16   Svalina, Gorica, MD  Blood Glucose Monitoring Suppl (ACCU-CHEK GUIDE) w/Device KIT 1 each by Does not apply route 3 (three) times daily. The patient is insulin   requiring, ICD 10 code E11.65. The patient tests 3 times a day. 02/05/19   Jose Persia, MD  glucose blood (ACCU-CHEK GUIDE)  test strip USE TO TEST BLOOD SUGAR THREE TIMES A DAY 06/17/20   Harvie Heck, MD    Current Facility-Administered Medications  Medication Dose Route Frequency Provider Last Rate Last Admin  . 0.9 %  sodium chloride infusion (Manually program via Guardrails IV Fluids)   Intravenous Once Harold Hedge, MD        Allergies as of 07/07/2020 - Review Complete 07/07/2020  Allergen Reaction Noted  . Ciprofloxacin Hives and Swelling 12/25/2011  . Morphine and related Other (See Comments) 12/25/2011  . Penicillins Hives and Swelling   . Codeine Hives and Swelling   . Lisinopril Cough 01/04/2016  . Feraheme [ferumoxytol] Itching 11/17/2012    Family History  Problem Relation Age of Onset  . Cancer Father        unknown  . Hypertension Mother   . Diverticulitis Mother     Social History   Socioeconomic History  . Marital status: Widowed    Spouse name: Not on file  . Number of children: 3  . Years of education: Not on file  . Highest education level: Not on file  Occupational History  . Occupation: Retired  Tobacco Use  . Smoking status: Current Some Day Smoker    Packs/day: 0.50    Years: 17.00    Pack years: 8.50    Types: Cigarettes  . Smokeless tobacco: Never Used  . Tobacco comment: 1 pack last 2 weeks.  Vaping Use  . Vaping Use: Never used  Substance and Sexual Activity  . Alcohol use: Yes    Alcohol/week: 0.0 standard drinks  . Drug use: Yes    Types: Cocaine, Marijuana    Comment: Sometimes.  . Sexual activity: Yes    Partners: Male    Birth control/protection: Post-menopausal  Other Topics Concern  . Not on file  Social History Narrative  . Not on file   Social Determinants of Health   Financial Resource Strain: Not on file  Food Insecurity: Not on file  Transportation Needs: Not on file  Physical Activity: Not on file  Stress: Not on file  Social Connections: Not on file  Intimate Partner Violence: Not on file    Review of Systems: All systems  reviewed and negative except where noted in HPI.    OBJECTIVE:    Physical Exam: Vital signs in last 24 hours: Temp:  [98.8 F (37.1 C)] 98.8 F (37.1 C) (02/10 1223) Pulse Rate:  [96] 96 (02/10 1223) Resp:  [17] 17 (02/10 1223) BP: (118)/(60) 118/60 (02/10 1223) SpO2:  [99 %] 99 % (02/10 1223)   General:  Alert  female in NAD Psych:  cooperative. Flat affect, frustrated but no unpleasant. . Eyes:  Pupils equal, sclera clear, no icterus.   Conjunctiva pink. Ears:  Normal auditory acuity. Nose:  No deformity, discharge,  or lesions. Neck:  Supple; no masses Lungs:  Clear throughout to auscultation.   No wheezes, crackles, or rhonchi.  Heart:  Regular rate and rhythm; soft murmur, no lower extremity edema Abdomen:  Soft, non-distended, nontender, BS active, no palp mass   Rectal:  Deferred  . Neurologic:  Alert and  oriented x4;  grossly normal neurologically. Skin:  Intact without significant lesions or rashes.     Scheduled inpatient medications . sodium chloride   Intravenous Once      Intake/Output from previous day:  No intake/output data recorded. Intake/Output this shift: No intake/output data recorded.   Lab Results: Recent Labs    07/07/20 1155  WBC 8.9  HGB 7.7*  HCT 24.5*  PLT 340   . CBC Latest Ref Rng & Units 07/07/2020 07/04/2020 06/29/2020  WBC 4.0 - 10.5 K/uL 8.9 7.3 8.8  Hemoglobin 12.0 - 15.0 g/dL 7.7(L) 7.8(L) 8.1(L)  Hematocrit 36.0 - 46.0 % 24.5(L) 25.2(L) 24.8(L)  Platelets 150 - 400 K/uL 340 333 430(H)    . CMP Latest Ref Rng & Units 04/27/2020 04/06/2020 03/23/2020  Glucose 70 - 99 mg/dL 105(H) 127(H) 133(H)  BUN 8 - 23 mg/dL _0 Creatinine 0.44 - 1.00 mg/dL 1.17(H) 1.18(H) 1.14(H)  Sodium 135 - 145 mmol/L 140 142 143  Potassium 3.5 - 5.1 mmol/L 4.0 3.7 3.3(L)  Chloride 98 - 111 mmol/L 106 103 106  CO2 22 - 32 mmol/L _1 Calcium 8.9 - 10.3 mg/dL 8.6(L) 8.9 8.6(L)  Total Protein 6.5 - 8.1 g/dL 6.6 6.7 6.3(L)  Total  Bilirubin 0.3 - 1.2 mg/dL 0.2(L) 0.3 <0.2(L)  Alkaline Phos 38 - 126 U/L 64 70 63  AST 15 - 41 U/L _2 ALT 0 - 44 U/L _3 Tye Savoy, NP-C @  07/07/2020, 2:32 PM   ________________________________________________________________________  Velora Heckler GI MD note:  I personally examined the patient, reviewed the data and agree with the assessment and plan described above.  She had numerous AVMs in her small bowel treated with APC over the past 8-10 years, I suspect she is having bleeding from another one (or several) now.  We are planning small bowel enteroscopy tomorrow for further evaluation.   Owens Loffler, MD [facility] Pager [phone]

## 2020-07-07 NOTE — Progress Notes (Signed)
Error

## 2020-07-07 NOTE — Consult Note (Addendum)
Referring Provider:  [facility]         Primary Care Physician:  Jose Persia, MD Primary Gastroenterologist:  Wilfrid Lund, MD             We were asked to see this patient for:  GI bleed                ASSESSMENT / PLAN:   # 70 yo female with acute on chronic anemia and black stools,  most likely from known small bowel AVMs. Multiple endoscopic evaluations through the years.  Hematology has been managing her IDA with periodic Fe+ infusions. Hgb had been stable in mid 9 to mid 10 range, now 7.7 .  --Last small bowel enteroscopy in 2019, small bowel AVMs not found within reach of enteroscopy. She will need repeat small bowel enteroscopy. The risks and benefits of enteroscopy were discussed and the patient agrees to proceed. This will be done tomorrow at 2pm.    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   # HTN  # Hx of splenic hematoma, incidental finding in June 2021. PCP is following and to schedule for follow up CT scan to ensure no changes    HPI:                                                                                                                             Chief Complaint:  Black stools, recurrent anemia  [name] is a 70 y.o. female with hx of IDA / chronic small bowel AVM blood loss left AKA, DM, splenic hematoma, HTN, HCV (treated)   Patient has a history chronic GI blood. She has had extensive endoscopic evaluation and chronic blood loss felt to be secondary to small bowel AVMs. She is followed by hematology for iron deficiency anemia and gets periodic iron infusions and blood transfusions.  She is apparently not on oral iron.  Patient was seen in our office 2 days ago for evaluation of anemia and melena.  She was significantly fatigued.  Dr. Loletha Carrow recommended ED with hospital admission.  She was not able to go to the ED due to her sister's funeral yesterday.  She was sent to the hospital today by her oncologist.  Hemoglobin is  7.7, down from 10 in mid January.  [name] has been having black stools for about two weeks. She has no abdominal pain, nausea nor vomiting. She endorses fatigue. No chest pain. She takes a baby asa, no other NSAIDs. No epistaxis, no vaginal bleeding. She does report hematuria but says she gets that anytime she has a GI bleed. Patient is very frustrated at this ongoing problem of chronic GI blood loss.    PREVIOUS ENDOSCOPIC EVALUATIONS / PERTINENT STUDIES   Multiple endoscopies for GI bleed. Most recent ones below Oct 2019 colonoscopy -Quality of prep good after lavage -Decreased sphincter tone found on digital rectal exam. - Patent end-to-side colo-colonic anastomosis, characterized  by healthy appearing mucosa. - The examination was otherwise normal on direct and retroflexion views.multiple   June 2019 EGD  --Normal esophagus. - A medium amount of food (residue) in the stomach. - Normal examined duodenum. - The examined portion of the jejunum was normal. - No specimens collected. No SB AVMs seen.   Past Medical History:  Diagnosis Date  . Allergic rhinitis   . Anemia, iron deficiency 05/03/2011   2/2 GI AVMs - recieves iron infusions as well as periodic red blood cell transfusions  . Anxiety   . Arteriovenous malformation of duodenum 02/25/2015  . CARPAL TUNNEL RELEASE, HX OF 07/22/2008  . Chest pain 10/28/2019  . Colon polyps   . Diabetes mellitus without complication ([place])    type 2  . Difficulty sleeping    takes trazadone for sleep  . Diverticulosis   . Fall 01/03/2020  . GERD (gastroesophageal reflux disease)   . Hepatitis C    in SVR with Harvoni 12/2014-02/2015.   Marland Kitchen History of hernia repair 08/18/2012  . History of porphyria 11/30/2014   Porphyria cutanea tarda in setting of chronic Hepatitis C, frequent  IV iron infusions, and alcohol abuse   . Hypertension   . Hypocalcemia 10/28/2019  . Hypokalemia 09/27/2014  . Peripheral vascular disease ([place])   . Stroke [facility])  2010   no residual deficits  . Substance use disorder    cocaine  . Tachycardia 07/10/2017  . Trichomonas vaginitis 04/16/2019    Past Surgical History:  Procedure Laterality Date  . ABDOMINAL HYSTERECTOMY    . APPLICATION OF WOUND VAC N/A 06/21/2014   Procedure: APPLICATION OF WOUND VAC;  Surgeon: Coralie Keens, MD;  Location: [place];  Service: General;  Laterality: N/A;  . CARPAL TUNNEL RELEASE     rt hand  . COLONOSCOPY WITH PROPOFOL N/A 03/17/2018   Procedure: COLONOSCOPY WITH PROPOFOL;  Surgeon: Doran Stabler, MD;  Location: [facility];  Service: Gastroenterology;  Laterality: N/A;  . ENTEROSCOPY N/A 12/04/2012   Procedure: ENTEROSCOPY;  Surgeon: Beryle Beams, MD;  Location: [facility];  Service: Endoscopy;  Laterality: N/A;  . ENTEROSCOPY N/A 12/18/2012   Procedure: ENTEROSCOPY;  Surgeon: Beryle Beams, MD;  Location[facility];  Service: Endoscopy;  Laterality: N/A;  . ENTEROSCOPY N/A 02/25/2015   Procedure: ENTEROSCOPY;  Surgeon: Inda Castle, MD;  Location[facility];  Service: Endoscopy;  Laterality: N/A;  . ENTEROSCOPY N/A 10/29/2017   Procedure: ENTEROSCOPY;  Surgeon: Doran Stabler, MD;  [facility];  Service: Gastroenterology;  Laterality: N/A;  . ESOPHAGOGASTRODUODENOSCOPY  05/05/2011   Procedure: ESOPHAGOGASTRODUODENOSCOPY (EGD);  Surgeon: Zenovia Jarred, MD;  [facility];  Service: Gastroenterology;  Laterality: N/A;  Dr. Hilarie Fredrickson will do procedure for Dr. Benson Norway Saturday.  . ESOPHAGOGASTRODUODENOSCOPY  05/08/2011   Procedure: ESOPHAGOGASTRODUODENOSCOPY (EGD);  Surgeon: Beryle Beams;  [facility];  Service: Endoscopy;  Laterality: N/A;  . ESOPHAGOGASTRODUODENOSCOPY  06/07/2011   Procedure: ESOPHAGOGASTRODUODENOSCOPY (EGD);  Surgeon: Beryle Beams, MD;  Location[facility];  Service: Endoscopy;  Laterality: N/A;  . ESOPHAGOGASTRODUODENOSCOPY  12/20/2011   Procedure: ESOPHAGOGASTRODUODENOSCOPY (EGD);  Surgeon: Beryle Beams, MD;  Location: [facility];  Service: Endoscopy;  Laterality: N/A;  . EYE SURGERY Bilateral    cataracts  . FLEXIBLE SIGMOIDOSCOPY  12/21/2011   Procedure: FLEXIBLE SIGMOIDOSCOPY;  Surgeon: Beryle Beams, MD;  [facility];  Service: Endoscopy;  Laterality: N/A;  . HOT HEMOSTASIS  06/07/2011   Procedure: HOT HEMOSTASIS (ARGON  PLASMA COAGULATION/BICAP);  Surgeon: Beryle Beams, MD;  Location[facility];  Service: Endoscopy;  Laterality: N/A;  . INCISIONAL HERNIA REPAIR N/A 06/01/2014   Procedure: ATTEMPTED LAPAROSCOPIC AND OPEN INCISIONAL HERNIA REPAIR WITH MESH;  Surgeon: Coralie Keens, MD;  Location: [place];  Service: General;  Laterality: N/A;  . INSERTION OF MESH N/A 06/01/2014   Procedure: INSERTION OF MESH;  Surgeon: Coralie Keens, MD;  Location: [place];  Service: General;  Laterality: N/A;  . LAPAROTOMY  02/16/2012   Procedure: EXPLORATORY LAPAROTOMY;  Surgeon: Edward Jolly, MD;  Location[facility];  Service: General;  Laterality: N/A;  oversewing of anastomotic leak and rigid sigmoidoscopy  . LAPAROTOMY N/A 06/21/2014   Procedure: ABDOMINAL WOUND EXPLORATION;  Surgeon: Coralie Keens, MD;  Location: [place];  Service: General;  Laterality: N/A;  . LEG AMPUTATION ABOVE KNEE     left  . PARTIAL COLECTOMY  02/15/2012   Procedure: PARTIAL COLECTOMY;  Surgeon: Harl Bowie, MD;  Location: [facility];  Service: General;  Laterality: N/A;  . TONSILLECTOMY      Prior to Admission medications   Medication Sig Start Date End Date Taking? Authorizing Provider  acetaminophen (TYLENOL) 500 MG tablet Take 1,000 mg by mouth every 6 (six) hours as needed.   Yes [provider]  allopurinol (ZYLOPRIM) 100 MG tablet TAKE 1 TABLET (100 MG TOTAL) BY MOUTH DAILY. 10/23/19  Yes Jose Persia, MD  amLODipine (NORVASC) 10 MG tablet Take 1 tablet (10 mg total) by mouth daily. 02/08/20  Yes Jean Rosenthal, MD  aspirin EC 81 MG tablet Take 1 tablet (81 mg total) by mouth daily.  03/04/15  Yes Burgess Estelle, MD  cetirizine (ZYRTEC) 10 MG tablet Take 1 tablet (10 mg total) by mouth daily. 02/12/20  Yes Jean Rosenthal, MD  cholecalciferol (VITAMIN D) 1000 units tablet Take 1 tablet (1,000 Units total) by mouth daily. 10/04/16  Yes [name], MD  colchicine 0.6 MG tablet TAKE 1 TABLET (0.6 MG TOTAL) BY MOUTH DAILY. Patient taking differently: Take 0.6 mg by mouth as needed (gout). 03/14/20  Yes Aslam, Loralyn Freshwater, MD  Dulaglutide (TRULICITY) 3.83 FX/8.3AN SOPN Inject 0.5 mLs (0.75 mg total) into the skin once a week. 11/02/19  Yes Asencion Noble, MD  feeding supplement, GLUCERNA SHAKE, (GLUCERNA SHAKE) LIQD Take 237 mLs by mouth 3 (three) times daily between meals. Patient taking differently: Take 237 mLs by mouth 2 (two) times daily between meals. 10/04/16  Yes [name], MD  gabapentin (NEURONTIN) 400 MG capsule Take 1-2 capsules (400-800 mg total) by mouth See admin instructions. Take 1 capsule (461m) in the morning, 1 capsule (4025m in the afternoon and 2  Capsules (80040mbefore bedtime Patient taking differently: Take 400-800 mg by mouth 3 (three) times daily. 03/14/20 06/12/20 Yes Aslam, SadLoralyn FreshwaterD  losartan (COZAAR) 100 MG tablet Take 1 tablet (100 mg total) by mouth daily. 10/14/19  Yes BasJose PersiaD  metFORMIN (GLUCOPHAGE) 1000 MG tablet TAKE 1 TABLET BY MOUTH TWICE A DAY WITH A MEAL Patient taking differently: Take 1,000 mg by mouth daily. 04/14/20  Yes Katsadouros, Vasilios, MD  omeprazole (PRILOSEC) 20 MG capsule Take 1 capsule (20 mg total) by mouth daily. 04/18/20  Yes BasJose PersiaD  ACCU-CHEK FASTCLIX LANCETS MISC Use to test blood glucose 3 times daily. 10/04/16   SvaAlphonzo GrieveD  Blood Glucose Monitoring Suppl (ACCU-CHEK GUIDE) w/Device KIT 1 each by Does not apply route 3 (three) times daily. The patient is insulin  requiring, ICD 10 code E11.65. The patient tests 3 times a day. 02/05/19   Jose Persia, MD  glucose blood (ACCU-CHEK GUIDE)  test strip USE TO TEST BLOOD SUGAR THREE TIMES A DAY 06/17/20   Harvie Heck, MD    Current Facility-Administered Medications  Medication Dose Route Frequency Provider Last Rate Last Admin  . 0.9 %  sodium chloride infusion (Manually program via Guardrails IV Fluids)   Intravenous Once Harold Hedge, MD        Allergies as of 07/07/2020 - Review Complete 07/07/2020  Allergen Reaction Noted  . Ciprofloxacin Hives and Swelling 12/25/2011  . Morphine and related Other (See Comments) 12/25/2011  . Penicillins Hives and Swelling   . Codeine Hives and Swelling   . Lisinopril Cough 01/04/2016  . Feraheme [ferumoxytol] Itching 11/17/2012    Family History  Problem Relation Age of Onset  . Cancer Father        unknown  . Hypertension Mother   . Diverticulitis Mother     Social History   Socioeconomic History  . Marital status: Widowed    Spouse name: Not on file  . Number of children: 3  . Years of education: Not on file  . Highest education level: Not on file  Occupational History  . Occupation: Retired  Tobacco Use  . Smoking status: Current Some Day Smoker    Packs/day: 0.50    Years: 17.00    Pack years: 8.50    Types: Cigarettes  . Smokeless tobacco: Never Used  . Tobacco comment: 1 pack last 2 weeks.  Vaping Use  . Vaping Use: Never used  Substance and Sexual Activity  . Alcohol use: Yes    Alcohol/week: 0.0 standard drinks  . Drug use: Yes    Types: Cocaine, Marijuana    Comment: Sometimes.  . Sexual activity: Yes    Partners: Male    Birth control/protection: Post-menopausal  Other Topics Concern  . Not on file  Social History Narrative  . Not on file   Social Determinants of Health   Financial Resource Strain: Not on file  Food Insecurity: Not on file  Transportation Needs: Not on file  Physical Activity: Not on file  Stress: Not on file  Social Connections: Not on file  Intimate Partner Violence: Not on file    Review of Systems: All systems  reviewed and negative except where noted in HPI.    OBJECTIVE:    Physical Exam: Vital signs in last 24 hours: Temp:  [98.8 F (37.1 C)] 98.8 F (37.1 C) (02/10 1223) Pulse Rate:  [96] 96 (02/10 1223) Resp:  [17] 17 (02/10 1223) BP: (118)/(60) 118/60 (02/10 1223) SpO2:  [99 %] 99 % (02/10 1223)   General:  Alert  female in NAD Psych:  cooperative. Flat affect, frustrated but no unpleasant. . Eyes:  Pupils equal, sclera clear, no icterus.   Conjunctiva pink. Ears:  Normal auditory acuity. Nose:  No deformity, discharge,  or lesions. Neck:  Supple; no masses Lungs:  Clear throughout to auscultation.   No wheezes, crackles, or rhonchi.  Heart:  Regular rate and rhythm; soft murmur, no lower extremity edema Abdomen:  Soft, non-distended, nontender, BS active, no palp mass   Rectal:  Deferred  . Neurologic:  Alert and  oriented x4;  grossly normal neurologically. Skin:  Intact without significant lesions or rashes.     Scheduled inpatient medications . sodium chloride   Intravenous Once      Intake/Output from previous day:  No intake/output data recorded. Intake/Output this shift: No intake/output data recorded.   Lab Results: Recent Labs    07/07/20 1155  WBC 8.9  HGB 7.7*  HCT 24.5*  PLT 340   . CBC Latest Ref Rng & Units 07/07/2020 07/04/2020 06/29/2020  WBC 4.0 - 10.5 K/uL 8.9 7.3 8.8  Hemoglobin 12.0 - 15.0 g/dL 7.7(L) 7.8(L) 8.1(L)  Hematocrit 36.0 - 46.0 % 24.5(L) 25.2(L) 24.8(L)  Platelets 150 - 400 K/uL 340 333 430(H)    . CMP Latest Ref Rng & Units 04/27/2020 04/06/2020 03/23/2020  Glucose 70 - 99 mg/dL 105(H) 127(H) 133(H)  BUN 8 - 23 mg/dL _0 Creatinine 0.44 - 1.00 mg/dL 1.17(H) 1.18(H) 1.14(H)  Sodium 135 - 145 mmol/L 140 142 143  Potassium 3.5 - 5.1 mmol/L 4.0 3.7 3.3(L)  Chloride 98 - 111 mmol/L 106 103 106  CO2 22 - 32 mmol/L _1 Calcium 8.9 - 10.3 mg/dL 8.6(L) 8.9 8.6(L)  Total Protein 6.5 - 8.1 g/dL 6.6 6.7 6.3(L)  Total  Bilirubin 0.3 - 1.2 mg/dL 0.2(L) 0.3 <0.2(L)  Alkaline Phos 38 - 126 U/L 64 70 63  AST 15 - 41 U/L _2 ALT 0 - 44 U/L _3 Tye Savoy, NP-C @  07/07/2020, 2:32 PM   ________________________________________________________________________  Velora Heckler GI MD note:  I personally examined the patient, reviewed the data and agree with the assessment and plan described above.  She had numerous AVMs in her small bowel treated with APC over the past 8-10 years, I suspect she is having bleeding from another one (or several) now.  We are planning small bowel enteroscopy tomorrow for further evaluation.   Owens Loffler, MD [facility] Gastroenterology Pager [phone]

## 2020-07-08 ENCOUNTER — Observation Stay (HOSPITAL_COMMUNITY): Payer: Medicare Other | Admitting: Registered Nurse

## 2020-07-08 ENCOUNTER — Encounter (HOSPITAL_COMMUNITY): Payer: Self-pay | Admitting: Internal Medicine

## 2020-07-08 ENCOUNTER — Encounter (HOSPITAL_COMMUNITY)
Admission: AD | Disposition: A | Discharge status: Home / Self Care | Payer: Self-pay | Source: Ambulatory Visit | Attending: Internal Medicine

## 2020-07-08 DIAGNOSIS — K922 Gastrointestinal hemorrhage, unspecified: Secondary | ICD-10-CM | POA: Diagnosis not present

## 2020-07-08 HISTORY — PX: HOT HEMOSTASIS: SHX5433

## 2020-07-08 HISTORY — PX: SUBMUCOSAL INJECTION: SHX5543

## 2020-07-08 HISTORY — PX: ENTEROSCOPY: SHX5533

## 2020-07-08 LAB — GLUCOSE, CAPILLARY
Glucose-Capillary: 115 mg/dL — ABNORMAL HIGH (ref 70–99)
Glucose-Capillary: 116 mg/dL — ABNORMAL HIGH (ref 70–99)
Glucose-Capillary: 253 mg/dL — ABNORMAL HIGH (ref 70–99)
Glucose-Capillary: 171 mg/dL — ABNORMAL HIGH (ref 70–99)

## 2020-07-08 LAB — BASIC METABOLIC PANEL
Anion gap: 8 (ref 5–15)
BUN: 20 mg/dL (ref 8–23)
CO2: 25 mmol/L (ref 22–32)
Calcium: 8.6 mg/dL — ABNORMAL LOW (ref 8.9–10.3)
Chloride: 104 mmol/L (ref 98–111)
Creatinine, Ser: 1.4 mg/dL — ABNORMAL HIGH (ref 0.44–1.00)
GFR, Estimated: 41 mL/min — ABNORMAL LOW (ref >60)
Glucose, Bld: 119 mg/dL — ABNORMAL HIGH (ref 70–99)
Potassium: 3.4 mmol/L — ABNORMAL LOW (ref 3.5–5.1)
Sodium: 137 mmol/L (ref 135–145)

## 2020-07-08 LAB — DIFFERENTIAL
Abs Immature Granulocytes: 0.11 10*3/uL — ABNORMAL HIGH (ref 0.00–0.07)
Basophils Absolute: 0 10*3/uL (ref 0.0–0.1)
Basophils Relative: 0 %
Eosinophils Absolute: 0.1 10*3/uL (ref 0.0–0.5)
Eosinophils Relative: 1 %
Immature Granulocytes: 1 %
Lymphocytes Relative: 3 %
Lymphs Abs: 0.3 10*3/uL — ABNORMAL LOW (ref 0.7–4.0)
Monocytes Absolute: 0.2 10*3/uL (ref 0.1–1.0)
Monocytes Relative: 3 %
Neutro Abs: 8.7 10*3/uL — ABNORMAL HIGH (ref 1.7–7.7)
Neutrophils Relative %: 92 %

## 2020-07-08 LAB — CBC
HCT: 24.7 % — ABNORMAL LOW (ref 36.0–46.0)
HCT: 31.4 % — ABNORMAL LOW (ref 36.0–46.0)
Hemoglobin: 7.6 g/dL — ABNORMAL LOW (ref 12.0–15.0)
Hemoglobin: 9.9 g/dL — ABNORMAL LOW (ref 12.0–15.0)
MCH: 33.5 pg (ref 26.0–34.0)
MCH: 32.2 pg (ref 26.0–34.0)
MCHC: 30.8 g/dL (ref 30.0–36.0)
MCHC: 31.5 g/dL (ref 30.0–36.0)
MCV: 108.8 fL — ABNORMAL HIGH (ref 80.0–100.0)
MCV: 102.3 fL — ABNORMAL HIGH (ref 80.0–100.0)
Platelets: 310 10*3/uL (ref 150–400)
Platelets: 310 10*3/uL (ref 150–400)
RBC: 2.27 MIL/uL — ABNORMAL LOW (ref 3.87–5.11)
RBC: 3.07 MIL/uL — ABNORMAL LOW (ref 3.87–5.11)
RDW: 18 % — ABNORMAL HIGH (ref 11.5–15.5)
RDW: 20 % — ABNORMAL HIGH (ref 11.5–15.5)
WBC: 7.7 10*3/uL (ref 4.0–10.5)
WBC: 9.7 10*3/uL (ref 4.0–10.5)
nRBC: 0 % (ref 0.0–0.2)
nRBC: 0 % (ref 0.0–0.2)

## 2020-07-08 LAB — PREPARE RBC (CROSSMATCH)

## 2020-07-08 SURGERY — ENTEROSCOPY
Anesthesia: Monitor Anesthesia Care

## 2020-07-08 MED ORDER — PROPOFOL 10 MG/ML IV BOLUS
INTRAVENOUS | Status: AC
  Filled 2020-07-08: qty 20

## 2020-07-08 MED ORDER — GABAPENTIN 400 MG PO CAPS
400.0000 mg | ORAL_CAPSULE | Freq: Two times a day (BID) | ORAL | Status: DC
  Administered 2020-07-08 – 2020-07-10 (×5): 400 mg via ORAL
  Filled 2020-07-08 (×5): qty 1

## 2020-07-08 MED ORDER — PHENYLEPHRINE 40 MCG/ML (10ML) SYRINGE FOR IV PUSH (FOR BLOOD PRESSURE SUPPORT)
PREFILLED_SYRINGE | INTRAVENOUS | Status: DC | PRN
  Administered 2020-07-08: 80 ug via INTRAVENOUS

## 2020-07-08 MED ORDER — LACTATED RINGERS IV SOLN: Freq: Once | INTRAVENOUS | Status: AC

## 2020-07-08 MED ORDER — LACTATED RINGERS IV SOLN: INTRAVENOUS | Status: DC | PRN

## 2020-07-08 MED ORDER — PANTOPRAZOLE SODIUM 40 MG IV SOLR
40.0000 mg | Freq: Two times a day (BID) | INTRAVENOUS | Status: DC
  Administered 2020-07-08: 40 mg via INTRAVENOUS
  Filled 2020-07-08: qty 40

## 2020-07-08 MED ORDER — PROPOFOL 10 MG/ML IV BOLUS
INTRAVENOUS | Status: DC | PRN
  Administered 2020-07-08 (×5): 20 mg via INTRAVENOUS
  Administered 2020-07-08: 30 mg via INTRAVENOUS
  Administered 2020-07-08 (×7): 20 mg via INTRAVENOUS

## 2020-07-08 MED ORDER — GABAPENTIN 400 MG PO CAPS
800.0000 mg | ORAL_CAPSULE | Freq: Every day | ORAL | Status: DC
  Administered 2020-07-08 – 2020-07-09 (×2): 800 mg via ORAL
  Filled 2020-07-08 (×2): qty 2

## 2020-07-08 MED ORDER — PANTOPRAZOLE SODIUM 40 MG PO TBEC
40.0000 mg | DELAYED_RELEASE_TABLET | Freq: Two times a day (BID) | ORAL | Status: DC
  Administered 2020-07-08 – 2020-07-10 (×4): 40 mg via ORAL
  Filled 2020-07-08 (×4): qty 1

## 2020-07-08 MED ORDER — LIDOCAINE 2% (20 MG/ML) 5 ML SYRINGE
INTRAMUSCULAR | Status: DC | PRN
  Administered 2020-07-08: 60 mg via INTRAVENOUS

## 2020-07-08 MED ORDER — SPOT INK MARKER SYRINGE KIT
PACK | SUBMUCOSAL | Status: AC
  Filled 2020-07-08: qty 5

## 2020-07-08 MED ORDER — GLUCAGON HCL RDNA (DIAGNOSTIC) 1 MG IJ SOLR
INTRAMUSCULAR | Status: DC | PRN
  Administered 2020-07-08: .5 mg via INTRAVENOUS

## 2020-07-08 MED ORDER — GLUCAGON HCL RDNA (DIAGNOSTIC) 1 MG IJ SOLR
INTRAMUSCULAR | Status: AC
  Filled 2020-07-08: qty 1

## 2020-07-08 MED ORDER — SPOT INK MARKER SYRINGE KIT
PACK | SUBMUCOSAL | Status: DC | PRN
  Administered 2020-07-08: 2 mL via SUBMUCOSAL

## 2020-07-08 NOTE — Anesthesia Preprocedure Evaluation (Signed)
Anesthesia Evaluation  Patient identified by MRN, date of birth, ID band Patient awake    Reviewed: Allergy & Precautions, NPO status , Patient's Chart, lab work & pertinent test results  Airway Mallampati: II       Dental  (+) Upper Dentures, Poor Dentition   Pulmonary Current Smoker and Patient abstained from smoking.,    breath sounds clear to auscultation       Cardiovascular hypertension, Pt. on medications + Peripheral Vascular Disease  Normal cardiovascular exam     Neuro/Psych PSYCHIATRIC DISORDERS Anxiety Depression  Neuromuscular disease CVA    GI/Hepatic GERD  Medicated and Controlled,(+) Hepatitis -, C  Endo/Other  diabetes, Type 2, Oral Hypoglycemic Agents  Renal/GU      Musculoskeletal  (+) Arthritis ,   Abdominal (+) + obese,   Peds  Hematology  (+) Blood dyscrasia, anemia ,   Anesthesia Other Findings   Reproductive/Obstetrics                             Lab Results  Component Value Date   WBC 9.7 07/08/2020   HGB 9.9 (L) 07/08/2020   HCT 31.4 (L) 07/08/2020   MCV 102.3 (H) 07/08/2020   PLT 310 07/08/2020   Lab Results  Component Value Date   CREATININE 1.40 (H) 07/08/2020   BUN 20 07/08/2020   NA 137 07/08/2020   K 3.4 (L) 07/08/2020   CL 104 07/08/2020   CO2 25 07/08/2020   Lab Results  Component Value Date   INR 1.0 10/29/2019   INR 1.05 08/17/2016   INR 1.13 02/23/2015   EKG: sinus tachycardia, 1st degree AV block.  Echo: - Left ventricle: The cavity size was normal. There was moderate   concentric hypertrophy. Systolic function was normal. The   estimated ejection fraction was in the range of 50% to 55%.   Diffuse hypokinesis. Doppler parameters are consistent with   abnormal left ventricular relaxation (grade 1 diastolic   dysfunction). - Aortic valve: There was mild regurgitation. - Aortic root: The aortic root was normal in size. - Left atrium:  The atrium was mildly dilated. - Right ventricle: Systolic function was normal. - Right atrium: The atrium was normal in size. - Tricuspid valve: There was mild regurgitation. - Pulmonic valve: There was no regurgitation. - Pulmonary arteries: Systolic pressure was within the normal   range. - Inferior vena cava: The vessel was normal in size. - Pericardium, extracardiac: There was no pericardial effusion  Anesthesia Physical  Anesthesia Plan  ASA: III  Anesthesia Plan: MAC   Post-op Pain Management:    Induction:   PONV Risk Score and Plan: 1 and Propofol infusion and TIVA  Airway Management Planned: Mask and Natural Airway  Additional Equipment: None  Intra-op Plan:   Post-operative Plan:   Informed Consent: I have reviewed the patients History and Physical, chart, labs and discussed the procedure including the risks, benefits and alternatives for the proposed anesthesia with the patient or authorized representative who has indicated his/her understanding and acceptance.     Dental advisory given  Plan Discussed with: CRNA  Anesthesia Plan Comments:         Anesthesia Quick Evaluation

## 2020-07-08 NOTE — Progress Notes (Addendum)
PROGRESS NOTE    [name]  CLE:[id number] DOB: 20-Apr-1951 DOA: 07/07/2020 PCP: Jose Persia, MD   Brief Narrative: 70 year old female with history of iron deficiency anemia requiring iron infusion and periodic prbc transfusion likely secondary to GI AVMs, diabetes, left AKA with prosthesis and wheelchair-bound, HCV s/p treatment, porphyria cutaneous tarda, hypertension, PVD, CVA without residual deficit, substance abuse disorder presented as a direct admission from Dr. Fritz Pickerel office at the cancer center where she was following for anemia. As per the report Patient was seen in the cancer center a week  PTA for worsening anemia and was urgently referred to her GI doctor, Dr. Loletha Carrow 2 days PTA, Dr. Loletha Carrow recommended hospital admission for GI bleeding work-up however she was unable to get to the ED due to her sister's funeral 2/9.Patient followed up in the cancer center 2/10- repeat lab work w/ low hb and sent to ED as direct hospital admission.  She has been having 1 to black stools every day for the past week most notably in the morning.  Has moderate fatigue but is still able to do her ADLs.  Denies any acute abdominal pain.  She does take baby aspirin and most recent dose was 2/10 night   Subjective: Upset abt bing hungrty  some llq ABD PAIN. LAST bm in cancer center- was black. Just finished 1 unit prbc at 7.30 am, cbc pending   Assessment & Plan:  Acute on chronic anemia in the setting of GI bleed with history of AVM and iron deficiency: GI is following closely, continue PPI, n.p.o. and for EGD today.  H&H has improved with PRBC transfusion.  Goal close to 10. Recent Labs  Lab 07/04/20 1009 07/07/20 1155 07/08/20 0107 07/08/20 0931  HGB 7.8* 7.7* 7.6* 9.9*  HCT 25.2* 24.5* 24.7* 31.4*   Essential hypertension, benign: Blood pressure stable holding amlodipine.  AKI on CKD IIIa, baseline creat 1.1 now at 1.5, monitor Recent Labs    01/14/20 0908 01/28/20 1016  02/11/20 1040 02/25/20 1021 03/11/20 0843 03/23/20 0914 04/06/20 1023 04/27/20 1009 07/07/20 1509 07/08/20 0107  BUN 11 14 13 15 16 15 14 15 19 20   CREATININE 0.95 1.08* 1.17* 1.22* 1.23* 1.14* 1.18* 1.17* 1.59* 1.40*   Type 2 diabetes mellitus with diabetic neuropathy: Continue sliding scale, hold home meds, continue Neurontin as patient requesting this morning.  Iron deficiency anemia due to chronic blood loss: Home aspirin on hold.  PVD holding home aspirin for now due to GI bleed and anemia.  Gout not in flare. Home meds on hold.  Overweight with BMI 29.7 outpatient weight loss and PCP follow-up  Nutrition: Diet Order            Diet clear liquid Room service appropriate? Yes; Fluid consistency: Thin  Diet effective now               Pt's Body mass index is 29.72 kg/m.  DVT prophylaxis: SCDs Start: 07/07/20 1448 Code Status:   Code Status: Full Code  Family Communication: plan of care discussed with patient at bedside.  Status is: admitted as Observation  Patient continues to require hospitalization for ongoing management of anemia and further GI work-up-egd and repeat cbc in am.  Dispo: The patient is from: Home              Anticipated d/c is to: Home              Anticipated d/c date is: 1 day  Patient currently is not medically stable to d/c.   Difficult to place patient No         Consultants:see note  Procedures:see note  Culture/Microbiology    Component Value Date/Time   SDES BLOOD RIGHT ANTECUBITAL 08/17/2016 2207   SPECREQUEST BOTTLES DRAWN AEROBIC AND ANAEROBIC 5ML EA 08/17/2016 2207   CULT  08/17/2016 2207    NO GROWTH 5 DAYS Performed at [facility] [address] [place], [place] [address]    REPTSTATUS 08/23/2016 FINAL 08/17/2016 2207    Other culture-see note  Medications: Scheduled Meds: . gabapentin  400 mg Oral TID  . insulin aspart  0-5 Units Subcutaneous QHS  . insulin aspart  0-9 Units Subcutaneous  TID WC  . pantoprazole (PROTONIX) IV  40 mg Intravenous Q12H   Continuous Infusions:  Antimicrobials: Anti-infectives (From admission, onward)   None     Objective: Vitals: Today's Vitals   07/08/20 0506 07/08/20 0535 07/08/20 0740 07/08/20 0818  BP: 126/78 118/78  138/85  Pulse: 64 60  67  Resp: 18 18  16   Temp: 97.8 F (36.6 C) 97.9 F (36.6 C)  97.9 F (36.6 C)  TempSrc: Oral Oral  Oral  SpO2: 97% 100%  96%  Weight:      Height:      PainSc:   Asleep     Intake/Output Summary (Last 24 hours) at 07/08/2020 0924 Last data filed at 07/08/2020 0818 Gross per 24 hour  Intake 1229.17 ml  Output -  Net 1229.17 ml   Filed Weights   07/07/20 1628  Weight: 73.7 kg   Weight change:   Intake/Output from previous day: 02/10 0701 - 02/11 0700 In: 795 [P.O.:480; Blood:315] Out: -  Intake/Output this shift: Total I/O In: 434.2 [Blood:434.2] Out: -  Filed Weights   07/07/20 1628  Weight: 73.7 kg    Examination: General exam: AAOx3,  ,NAD, weak appearing. HEENT:Oral mucosa moist, Ear/Nose WNL grossly,dentition normal. Respiratory system: bilaterally clear,no wheezing or crackles,no use of accessory muscle, non tender. Cardiovascular system: S1 & S2 +, regular, No JVD. Gastrointestinal system: Abdomen soft, mildly tender mid abdomen,ND, BS+. Nervous System:Alert, awake, moving extremities and grossly nonfocal Extremities: No edema, distal peripheral pulses palpable.  Skin: No rashes,no icterus. MSK: Normal muscle bulk,tone, power   Data Reviewed: I have personally reviewed following labs and imaging studies CBC: Recent Labs  Lab 07/04/20 1009 07/07/20 1155 07/08/20 0107  WBC 7.3 8.9 7.7  NEUTROABS 5.4 7.3  --   HGB 7.8* 7.7* 7.6*  HCT 25.2* 24.5* 24.7*  MCV 104.1* 103.8* 108.8*  PLT 333 340 188   Basic Metabolic Panel: Recent Labs  Lab 07/07/20 1509 07/08/20 0107  NA 140 137  K 3.7 3.4*  CL 103 104  CO2 28 25  GLUCOSE 90 119*  BUN 19 20   CREATININE 1.59* 1.40*  CALCIUM 8.8* 8.6*   GFR: Estimated Creatinine Clearance: 36.6 mL/min (A) (by C-G formula based on SCr of 1.4 mg/dL (H)). Liver Function Tests: Recent Labs  Lab 07/07/20 1509  AST 21  ALT 13  ALKPHOS 54  BILITOT 0.4  PROT 7.0  ALBUMIN 3.8   No results for input(s): LIPASE, AMYLASE in the last 168 hours. No results for input(s): AMMONIA in the last 168 hours. Coagulation Profile: No results for input(s): INR, PROTIME in the last 168 hours. Cardiac Enzymes: No results for input(s): CKTOTAL, CKMB, CKMBINDEX, TROPONINI in the last 168 hours. BNP (last 3 results) No results for input(s): PROBNP  in the last 8760 hours. HbA1C: Recent Labs    07/07/20 1509  HGBA1C 4.0*   CBG: Recent Labs  Lab 07/07/20 1720 07/07/20 2134 07/08/20 0805  GLUCAP 88 73 115*   Lipid Profile: No results for input(s): CHOL, HDL, LDLCALC, TRIG, CHOLHDL, LDLDIRECT in the last 72 hours. Thyroid Function Tests: No results for input(s): TSH, T4TOTAL, FREET4, T3FREE, THYROIDAB in the last 72 hours. Anemia Panel: Recent Labs    07/07/20 1155  FERRITIN 214   Sepsis Labs: No results for input(s): PROCALCITON, LATICACIDVEN in the last 168 hours.  Recent Results (from the past 240 hour(s))  SARS CORONAVIRUS 2 (TAT 6-24 HRS) Nasopharyngeal Nasopharyngeal Swab     Status: None   Collection Time: 07/07/20  1:44 PM   Specimen: Nasopharyngeal Swab  Result Value Ref Range Status   SARS Coronavirus 2 NEGATIVE NEGATIVE Final    Comment: (NOTE) SARS-CoV-2 target nucleic acids are NOT DETECTED.  The SARS-CoV-2 RNA is generally detectable in upper and lower respiratory specimens during the acute phase of infection. Negative results do not preclude SARS-CoV-2 infection, do not rule out co-infections with other pathogens, and should not be used as the sole basis for treatment or other patient management decisions. Negative results must be combined with clinical observations, patient  history, and epidemiological information. The expected result is Negative.  Fact Sheet for Patients: SugarRoll.be  Fact Sheet for Healthcare Providers: https://www.woods-mathews.com/  This test is not yet approved or cleared by the [place] FDA and  has been authorized for detection and/or diagnosis of SARS-CoV-2 by FDA under an Emergency Use Authorization (EUA). This EUA will remain  in effect (meaning this test can be used) for the duration of the COVID-19 declaration under Se ction 564(b)(1) of the Act, 21 U.S.C. section 360bbb-3(b)(1), unless the authorization is terminated or revoked sooner.  Performed at [facility], [place] [address], [place], [place] [address]      Radiology Studies: No results found.   LOS: 1 day   Antonieta Pert, MD [facility]  07/08/2020, 9:24 AM

## 2020-07-08 NOTE — Op Note (Signed)
[facility] Patient Name: [name] Procedure Date: 07/08/2020 MRN: [id number] Attending MD: Milus Banister , MD Date of Birth: [birth year] CSN: [id number] Age: 70 Admit Type: Outpatient Procedure:                Small bowel enteroscopy Indications:              Acute on chronic anemia, melenic stools, long                            history of small bowel AVMs however none via                            enteroscopy 2019 Providers:                Milus Banister, MD, Cleda Daub, RN, Fransico Setters                            [place], Technician Referring MD:              Medicines:                Monitored Anesthesia Care Complications:            No immediate complications. Estimated blood loss:                            None. Estimated Blood Loss:     Estimated blood loss: none. Procedure:                Pre-Anesthesia Assessment:                           - Prior to the procedure, a History and Physical                            was performed, and patient medications and                            allergies were reviewed. The patient's tolerance of                            previous anesthesia was also reviewed. The risks                            and benefits of the procedure and the sedation                            options and risks were discussed with the patient.                            All questions were answered, and informed consent                            was obtained. Prior Anticoagulants: The patient has                            taken no  previous anticoagulant or antiplatelet                            agents. ASA Grade Assessment: III - A patient with                            severe systemic disease. After reviewing the risks                            and benefits, the patient was deemed in                            satisfactory condition to undergo the procedure.                           After obtaining informed consent, the  endoscope was                            passed under direct vision. Throughout the                            procedure, the patient's blood pressure, pulse, and                            oxygen saturations were monitored continuously. The                            [id number]-[id number] ([id number] Olympus pediatric colonscope                            was introduced through the mouth and advanced to                            the proximal jejunum. The small bowel enteroscopy                            was accomplished without difficulty. The patient                            tolerated the procedure well. Scope In: Scope Out: Findings:      One typical appearing, medium sized, cherry red AVM was found in the       proximal jejunum. The AVM was not bleeding. I treated it with       application of APC with apparent good destruction of the lesion.      I placed a single 2cc submucosal tattoo at the distal extent of this       examination, probably 5cm distal to the AVM described above.      Small hiatal hernia.      The examination was otherwise normal. Impression:               - One typical appearing, medium sized, cherry red                            AVM was found in  the proximal jejunum. The AVM was                            not bleeding. I treated it with application of APC                            with apparent good destruction of the lesion.                           - I placed a single 2cc submucosal SPOT tattoo at                            the distal extent of this examination, probably 5cm                            distal to the AVM described above.                           - Small hiatal hernia.                           - The examination was otherwise normal Recommendation:           - Observe for further bleeding. She had a very good                            bump in Hb from 7.6 to 9.9 with a single unit of                            PRBC yesterday.                            - If there is suspicion for persistent bleeding                            then will likely need a small bowel capsule, the                            SPOT tattoo I placed today will serve as a good                            reference point in that instance.                           - I will allow her to eat for now and will order                            CBD for the morning. Procedure Code(s):        --- Professional ---                           [phone], Small intestinal endoscopy, enteroscopy  beyond second portion of duodenum, not including                            ileum; with control of bleeding (eg, injection,                            bipolar cautery, unipolar cautery, laser, heater                            probe, stapler, plasma coagulator) Diagnosis Code(s):        --- Professional ---                           O12.24, Angiodysplasia of colon without hemorrhage                           D62, Acute posthemorrhagic anemia CPT copyright 2019 American Medical Association. All rights reserved. The codes documented in this report are preliminary and upon coder review may  be revised to meet current compliance requirements. Milus Banister, MD 07/08/2020 2:40:37 PM This report has been signed electronically. Number of Addenda: 0

## 2020-07-08 NOTE — Transfer of Care (Signed)
Immediate Anesthesia Transfer of Care Note  Patient: [name]  Procedure(s) Performed: ENTEROSCOPY (N/A ) SUBMUCOSAL INJECTION HOT HEMOSTASIS (ARGON PLASMA COAGULATION/BICAP) (N/A )  Patient Location: PACU  Anesthesia Type:MAC  Level of Consciousness: sedated  Airway & Oxygen Therapy: Patient Spontanous Breathing and Patient connected to face mask oxygen  Post-op Assessment: Report given to RN and Post -op Vital signs reviewed and stable  Post vital signs: Reviewed and stable  Last Vitals:  Vitals Value Taken Time  BP    Temp    Pulse    Resp    SpO2      Last Pain:  Vitals:   07/08/20 1252  TempSrc: Oral  PainSc: 8          Complications: No complications documented.

## 2020-07-08 NOTE — Interval H&P Note (Signed)
History and Physical Interval Note:  07/08/2020 1:06 PM  [name]  has presented today for surgery, with the diagnosis of anemia, GI bleed, history of small bowel AVMs.  The various methods of treatment have been discussed with the patient and family. After consideration of risks, benefits and other options for treatment, the patient has consented to  Procedure(s): ENTEROSCOPY (N/A) as a surgical intervention.  The patient's history has been reviewed, patient examined, no change in status, stable for surgery.  I have reviewed the patient's chart and labs.  Questions were answered to the patient's satisfaction.     Milus Banister

## 2020-07-08 NOTE — Anesthesia Procedure Notes (Signed)
Date/Time: 07/08/2020 1:55 PM Performed by: Talbot Grumbling, CRNA Oxygen Delivery Method: Simple face mask

## 2020-07-09 ENCOUNTER — Observation Stay (HOSPITAL_COMMUNITY): Payer: Medicare Other

## 2020-07-09 DIAGNOSIS — Z8673 Personal history of transient ischemic attack (TIA), and cerebral infarction without residual deficits: Secondary | ICD-10-CM | POA: Diagnosis not present

## 2020-07-09 DIAGNOSIS — Z885 Allergy status to narcotic agent status: Secondary | ICD-10-CM | POA: Diagnosis not present

## 2020-07-09 DIAGNOSIS — Z20822 Contact with and (suspected) exposure to covid-19: Secondary | ICD-10-CM | POA: Diagnosis present

## 2020-07-09 DIAGNOSIS — Z8249 Family history of ischemic heart disease and other diseases of the circulatory system: Secondary | ICD-10-CM | POA: Diagnosis not present

## 2020-07-09 DIAGNOSIS — Z809 Family history of malignant neoplasm, unspecified: Secondary | ICD-10-CM | POA: Diagnosis not present

## 2020-07-09 DIAGNOSIS — D649 Anemia, unspecified: Secondary | ICD-10-CM | POA: Diagnosis present

## 2020-07-09 DIAGNOSIS — Z79899 Other long term (current) drug therapy: Secondary | ICD-10-CM | POA: Diagnosis not present

## 2020-07-09 DIAGNOSIS — M109 Gout, unspecified: Secondary | ICD-10-CM | POA: Diagnosis present

## 2020-07-09 DIAGNOSIS — K5521 Angiodysplasia of colon with hemorrhage: Secondary | ICD-10-CM | POA: Diagnosis present

## 2020-07-09 DIAGNOSIS — Z888 Allergy status to other drugs, medicaments and biological substances status: Secondary | ICD-10-CM | POA: Diagnosis not present

## 2020-07-09 DIAGNOSIS — K922 Gastrointestinal hemorrhage, unspecified: Secondary | ICD-10-CM | POA: Diagnosis not present

## 2020-07-09 DIAGNOSIS — D500 Iron deficiency anemia secondary to blood loss (chronic): Secondary | ICD-10-CM

## 2020-07-09 DIAGNOSIS — Z8619 Personal history of other infectious and parasitic diseases: Secondary | ICD-10-CM | POA: Diagnosis not present

## 2020-07-09 DIAGNOSIS — Z880 Allergy status to penicillin: Secondary | ICD-10-CM | POA: Diagnosis not present

## 2020-07-09 DIAGNOSIS — F17210 Nicotine dependence, cigarettes, uncomplicated: Secondary | ICD-10-CM | POA: Diagnosis present

## 2020-07-09 DIAGNOSIS — K219 Gastro-esophageal reflux disease without esophagitis: Secondary | ICD-10-CM | POA: Diagnosis present

## 2020-07-09 DIAGNOSIS — Z7982 Long term (current) use of aspirin: Secondary | ICD-10-CM | POA: Diagnosis not present

## 2020-07-09 DIAGNOSIS — I10 Essential (primary) hypertension: Secondary | ICD-10-CM | POA: Diagnosis present

## 2020-07-09 DIAGNOSIS — D62 Acute posthemorrhagic anemia: Secondary | ICD-10-CM | POA: Diagnosis present

## 2020-07-09 DIAGNOSIS — Z89612 Acquired absence of left leg above knee: Secondary | ICD-10-CM | POA: Diagnosis not present

## 2020-07-09 DIAGNOSIS — E1140 Type 2 diabetes mellitus with diabetic neuropathy, unspecified: Secondary | ICD-10-CM | POA: Diagnosis present

## 2020-07-09 DIAGNOSIS — Z881 Allergy status to other antibiotic agents status: Secondary | ICD-10-CM | POA: Diagnosis not present

## 2020-07-09 DIAGNOSIS — Z993 Dependence on wheelchair: Secondary | ICD-10-CM | POA: Diagnosis not present

## 2020-07-09 LAB — BASIC METABOLIC PANEL
Anion gap: 9 (ref 5–15)
BUN: 18 mg/dL (ref 8–23)
CO2: 25 mmol/L (ref 22–32)
Calcium: 8.8 mg/dL — ABNORMAL LOW (ref 8.9–10.3)
Chloride: 101 mmol/L (ref 98–111)
Creatinine, Ser: 1.21 mg/dL — ABNORMAL HIGH (ref 0.44–1.00)
GFR, Estimated: 49 mL/min — ABNORMAL LOW (ref >60)
Glucose, Bld: 121 mg/dL — ABNORMAL HIGH (ref 70–99)
Potassium: 3.7 mmol/L (ref 3.5–5.1)
Sodium: 135 mmol/L (ref 135–145)

## 2020-07-09 LAB — TYPE AND SCREEN
ABO/RH(D): AB POS
Antibody Screen: NEGATIVE
Unit division: 0

## 2020-07-09 LAB — BPAM RBC
Blood Product Expiration Date: 202203142359
ISSUE DATE / TIME: 202202110511
Unit Type and Rh: 5100

## 2020-07-09 LAB — CBC
HCT: 28.4 % — ABNORMAL LOW (ref 36.0–46.0)
Hemoglobin: 9 g/dL — ABNORMAL LOW (ref 12.0–15.0)
MCH: 31.6 pg (ref 26.0–34.0)
MCHC: 31.7 g/dL (ref 30.0–36.0)
MCV: 99.6 fL (ref 80.0–100.0)
Platelets: 265 10*3/uL (ref 150–400)
RBC: 2.85 MIL/uL — ABNORMAL LOW (ref 3.87–5.11)
RDW: 19.4 % — ABNORMAL HIGH (ref 11.5–15.5)
WBC: 7.1 10*3/uL (ref 4.0–10.5)
nRBC: 0 % (ref 0.0–0.2)

## 2020-07-09 LAB — GLUCOSE, CAPILLARY
Glucose-Capillary: 130 mg/dL — ABNORMAL HIGH (ref 70–99)
Glucose-Capillary: 96 mg/dL (ref 70–99)
Glucose-Capillary: 97 mg/dL (ref 70–99)
Glucose-Capillary: 175 mg/dL — ABNORMAL HIGH (ref 70–99)

## 2020-07-09 MED ORDER — FENTANYL CITRATE (PF) 100 MCG/2ML IJ SOLN
12.5000 ug | Freq: Once | INTRAMUSCULAR | Status: AC
  Administered 2020-07-09: 12.5 ug via INTRAVENOUS
  Filled 2020-07-09: qty 2

## 2020-07-09 MED ORDER — IOHEXOL 9 MG/ML PO SOLN: 500.0000 mL | ORAL | Status: AC

## 2020-07-09 MED ORDER — IOHEXOL 300 MG/ML  SOLN: 100.0000 mL | Freq: Once | INTRAMUSCULAR | Status: DC | PRN

## 2020-07-09 MED ORDER — IOHEXOL 300 MG/ML  SOLN
80.0000 mL | Freq: Once | INTRAMUSCULAR | Status: AC | PRN
  Administered 2020-07-09: 80 mL via INTRAVENOUS

## 2020-07-09 MED ORDER — LACTATED RINGERS IV SOLN: INTRAVENOUS | Status: AC

## 2020-07-09 NOTE — Progress Notes (Addendum)
[facility] Progress Note    Since last GI note: SB enteroscopy yesterday, found and treated single AVM.  Unclear if this has been the source of her melena, anemia.  SPOT tattoo applied to distal extent of her enteroscopy.  See full report in epic.  No BM since admission  She has had pain, tenderness at her abd wall surgical scar site since last night.  + nausea.  Objective: Vital signs in last 24 hours: Temp:  [98.4 F (36.9 C)-98.8 F (37.1 C)] 98.8 F (37.1 C) (02/11 2036) Pulse Rate:  [69-96] 86 (02/11 2036) Resp:  [10-24] 15 (02/11 2036) BP: (129-132)/(70-83) 129/71 (02/11 2036) SpO2:  [94 %-96 %] 94 % (02/11 2036) Last BM Date: 07/08/20 General: alert and oriented times 3 Heart: regular rate and rythm Abdomen: soft, non-distended, complex midline scars with a fairly localized and swollen site of significant tenderness. I think there is hernia present that I cannot reduce due to such tenderness.  Lab Results: Recent Labs    07/08/20 0107 07/08/20 0931 07/09/20 0642  WBC 7.7 9.7 7.1  HGB 7.6* 9.9* 9.0*  PLT 310 310 265  MCV 108.8* 102.3* 99.6   Recent Labs    07/07/20 1509 07/08/20 0107 07/09/20 0642  NA 140 137 135  K 3.7 3.4* 3.7  CL 103 104 101  CO2 28 25 25   GLUCOSE 90 119* 121*  BUN 19 20 18   CREATININE 1.59* 1.40* 1.21*  CALCIUM 8.8* 8.6* 8.8*   Recent Labs    07/07/20 1509  PROT 7.0  ALBUMIN 3.8  AST 21  ALT 13  ALKPHOS 54  BILITOT 0.4   Medications: Scheduled Meds: . gabapentin  400 mg Oral BID  . gabapentin  800 mg Oral QHS  . insulin aspart  0-5 Units Subcutaneous QHS  . insulin aspart  0-9 Units Subcutaneous TID WC  . pantoprazole  40 mg Oral BID AC   Continuous Infusions: PRN Meds:.metoprolol tartrate  Assessment/Plan: 70 y.o. female with recent melena, acute on chronic anemia s/p SBE yesterday with cautery of a single small bowel AVM.  Not clear if the AVM that I treated was the cause of her melena, acute on  chronic anemia. No overt bleeding since admission fortunately.  I think she has incarcerated abd wall hernia, possibly related to air introduced during her SBE yesterday. Perforation from SBE seems less likely given how the localized tenderness but not out of the question. I've ordered a STAT CT and am going to alert general surgery as well.  NPO for now.   Milus Banister, MD  07/09/2020, 8:51 AM [facility] Pager [phone]

## 2020-07-09 NOTE — Consult Note (Signed)
[name] 22-Feb-1951  [id number].    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 She underwent endoscopy yesterday with APC treatment of a jejunal AVM. Today she reported significant LLQ abdominal pain with tenderness on exam. No nausea or vomiting. She has had multiple prior abdominal surgeries including left colectomy in 2013 for GI bleeding, and incisional hernia repair with mesh in 2016. She had wound breakdown after her hernia repair and had a vac placed about 3 weeks after surgery.  General surgery was consulted today due to concern for recurrent hernia with incarceration. Patient reports she started having pain last night. Vitals are within normal limits. No nausea, and she would like to eat today.  ROS: Review of Systems  Constitutional: Negative for chills and fever.  Respiratory: Negative for shortness of breath.   Gastrointestinal: Positive for abdominal pain. Negative for nausea and vomiting.  Neurological: Negative for focal weakness.    Family History  Problem Relation Age of Onset  . Cancer Father        unknown  . Hypertension Mother   . Diverticulitis Mother     Past Medical History:  Diagnosis Date  . Allergic rhinitis   . Anemia, iron deficiency 05/03/2011   2/2 GI AVMs - recieves iron infusions as well as periodic red blood cell transfusions  . Anxiety   . Arteriovenous malformation of duodenum 02/25/2015  . CARPAL TUNNEL RELEASE, HX OF 07/22/2008  . Chest pain 10/28/2019  . Colon polyps   . Diabetes mellitus without complication ([place])    type 2  . Difficulty sleeping    takes trazadone for sleep  . Diverticulosis   . Fall 01/03/2020  . GERD (gastroesophageal reflux disease)   . Hepatitis C    in SVR with Harvoni 12/2014-02/2015.   Marland Kitchen History of hernia  repair 08/18/2012  . History of porphyria 11/30/2014   Porphyria cutanea tarda in setting of chronic Hepatitis C, frequent  IV iron infusions, and alcohol abuse   . Hypertension   . Hypocalcemia 10/28/2019  . Hypokalemia 09/27/2014  . Peripheral vascular disease ([place])   . Stroke [facility]) 2010   no residual deficits  . Substance use disorder    cocaine  . Tachycardia 07/10/2017  . Trichomonas vaginitis 04/16/2019    Past Surgical History:  Procedure Laterality Date  . ABDOMINAL HYSTERECTOMY    . APPLICATION OF WOUND VAC N/A 06/21/2014   Procedure: APPLICATION OF WOUND VAC;  Surgeon: Coralie Keens, MD;  Location: [facility];  Service: General;  Laterality: N/A;  . CARPAL TUNNEL RELEASE     rt hand  . COLONOSCOPY WITH PROPOFOL N/A 03/17/2018   Procedure: COLONOSCOPY WITH PROPOFOL;  Surgeon: Doran Stabler, MD;  [facility];  Service: Gastroenterology;  Laterality: N/A;  . ENTEROSCOPY N/A 12/04/2012   Procedure: ENTEROSCOPY;  Surgeon: Beryle Beams, MD;  [facility];  Service: Endoscopy;  Laterality: N/A;  . ENTEROSCOPY N/A 12/18/2012   Procedure: ENTEROSCOPY;  Surgeon: Beryle Beams, MD;  [facility];  Service: Endoscopy;  Laterality: N/A;  . ENTEROSCOPY N/A 02/25/2015   Procedure: ENTEROSCOPY;  Surgeon: Inda Castle, MD;  Location: [facility];  Service: Endoscopy;  Laterality: N/A;  . ENTEROSCOPY N/A 10/29/2017   Procedure: ENTEROSCOPY;  Surgeon: Wilfrid Lund  L III, MD;  [facility];  Service: Gastroenterology;  Laterality: N/A;  . ESOPHAGOGASTRODUODENOSCOPY  05/05/2011   Procedure: ESOPHAGOGASTRODUODENOSCOPY (EGD);  Surgeon: Erick Blinks, MD;  Location: [facility];  Service: Gastroenterology;  Laterality: N/A;  Dr. Rhea Belton will do procedure for Dr. [name] Saturday.  . ESOPHAGOGASTRODUODENOSCOPY  05/08/2011   Procedure: ESOPHAGOGASTRODUODENOSCOPY (EGD);  Surgeon: Theda Belfast;  [facility];  Service: Endoscopy;  Laterality: N/A;  .  ESOPHAGOGASTRODUODENOSCOPY  06/07/2011   Procedure: ESOPHAGOGASTRODUODENOSCOPY (EGD);  Surgeon: Theda Belfast, MD;  Location: [facility];  Service: Endoscopy;  Laterality: N/A;  . ESOPHAGOGASTRODUODENOSCOPY  12/20/2011   Procedure: ESOPHAGOGASTRODUODENOSCOPY (EGD);  Surgeon: Theda Belfast, MD;  Location: [facility];  Service: Endoscopy;  Laterality: N/A;  . EYE SURGERY Bilateral    cataracts  . FLEXIBLE SIGMOIDOSCOPY  12/21/2011   Procedure: FLEXIBLE SIGMOIDOSCOPY;  Surgeon: Theda Belfast, MD;  [facility];  Service: Endoscopy;  Laterality: N/A;  . HOT HEMOSTASIS  06/07/2011   Procedure: HOT HEMOSTASIS (ARGON PLASMA COAGULATION/BICAP);  Surgeon: Theda Belfast, MD;  Location: [facility];  Service: Endoscopy;  Laterality: N/A;  . INCISIONAL HERNIA REPAIR N/A 06/01/2014   Procedure: ATTEMPTED LAPAROSCOPIC AND OPEN INCISIONAL HERNIA REPAIR WITH MESH;  Surgeon: Abigail Miyamoto, MD;  [facility];  Service: General;  Laterality: N/A;  . INSERTION OF MESH N/A 06/01/2014   Procedure: INSERTION OF MESH;  Surgeon: Abigail Miyamoto, MD;  Location[facility];  Service: General;  Laterality: N/A;  . LAPAROTOMY  02/16/2012   Procedure: EXPLORATORY LAPAROTOMY;  Surgeon: Mariella Saa, MD;  [facility];  Service: General;  Laterality: N/A;  oversewing of anastomotic leak and rigid sigmoidoscopy  . LAPAROTOMY N/A 06/21/2014   Procedure: ABDOMINAL WOUND EXPLORATION;  Surgeon: Abigail Miyamoto, MD;  Location[facility] OR;  Service: General;  Laterality: N/A;  . LEG AMPUTATION ABOVE KNEE     left  . PARTIAL COLECTOMY  02/15/2012   Procedure: PARTIAL COLECTOMY;  Surgeon: Shelly Rubenstein, MD;  [facility];  Service: General;  Laterality: N/A;  . TONSILLECTOMY      Social History:  reports that she has been smoking cigarettes. She has a 8.50 pack-year smoking history. She has never used smokeless tobacco. She reports current alcohol use. She reports current drug use. Drugs: Cocaine and  Marijuana.  Allergies:  Allergies  Allergen Reactions  . Ciprofloxacin Hives and Swelling  . Morphine And Related Other (See Comments)    Makes pt sad and paranoid  . Penicillins Hives and Swelling    Has patient had a PCN reaction causing immediate rash, facial/tongue/throat swelling, SOB or lightheadedness with hypotension:YES Has patient had a PCN reaction causing severe rash involving mucus membranes or skin necrosis:UNSURE Has patient had a PCN reaction that required hospitalization:YES Has patient had a PCN reaction occurring within the last 10 years:No If all of the above answers are "NO", then may proceed with Cephalosporin use.   . Codeine Hives and Swelling  . Lisinopril Cough    New onset dry cough after starting lisinopril. Resolved upon switching to losartan.  Duncan Dull [Ferumoxytol] Itching    Tolerated Feraheme 6/26 & 7/21 pre-medications prior to medication    Medications Prior to Admission  Medication Sig Dispense Refill  . acetaminophen (TYLENOL) 500 MG tablet Take 1,000 mg by mouth every 6 (six) hours as needed.    Marland Kitchen allopurinol (ZYLOPRIM) 100 MG tablet TAKE 1 TABLET (100 MG TOTAL) BY MOUTH DAILY. 30 tablet 5  . amLODipine (NORVASC)  10 MG tablet Take 1 tablet (10 mg total) by mouth daily. 90 tablet 3  . aspirin EC 81 MG tablet Take 1 tablet (81 mg total) by mouth daily. 30 tablet 1  . cetirizine (ZYRTEC) 10 MG tablet Take 1 tablet (10 mg total) by mouth daily. 30 tablet 0  . cholecalciferol (VITAMIN D) 1000 units tablet Take 1 tablet (1,000 Units total) by mouth daily. 100 tablet 2  . colchicine 0.6 MG tablet TAKE 1 TABLET (0.6 MG TOTAL) BY MOUTH DAILY. (Patient taking differently: Take 0.6 mg by mouth as needed (gout).) 30 tablet 1  . Dulaglutide (TRULICITY) 1.61 WR/6.0AV SOPN Inject 0.5 mLs (0.75 mg total) into the skin once a week. 6 mL 6  . feeding supplement, GLUCERNA SHAKE, (GLUCERNA SHAKE) LIQD Take 237 mLs by mouth 3 (three) times daily between meals.  (Patient taking differently: Take 237 mLs by mouth 2 (two) times daily between meals.) 90 Can 0  . gabapentin (NEURONTIN) 400 MG capsule Take 1-2 capsules (400-800 mg total) by mouth See admin instructions. Take 1 capsule ($RemoveBe'400mg'OxROQBnir$ ) in the morning, 1 capsule ($RemoveBe'400mg'AAbLnKZNm$ ) in the afternoon and 2  Capsules ($RemoveBe'800mg'sPOmlXtyy$ ) before bedtime (Patient taking differently: Take 400-800 mg by mouth 3 (three) times daily.) 180 capsule 3  . losartan (COZAAR) 100 MG tablet Take 1 tablet (100 mg total) by mouth daily. 90 tablet 3  . metFORMIN (GLUCOPHAGE) 1000 MG tablet TAKE 1 TABLET BY MOUTH TWICE A DAY WITH A MEAL (Patient taking differently: Take 1,000 mg by mouth daily.) 90 tablet 6  . omeprazole (PRILOSEC) 20 MG capsule Take 1 capsule (20 mg total) by mouth daily. 90 capsule 1  . ACCU-CHEK FASTCLIX LANCETS MISC Use to test blood glucose 3 times daily. 100 each 11  . Blood Glucose Monitoring Suppl (ACCU-CHEK GUIDE) w/Device KIT 1 each by Does not apply route 3 (three) times daily. The patient is insulin requiring, ICD 10 code E11.65. The patient tests 3 times a day. 1 kit 1  . glucose blood (ACCU-CHEK GUIDE) test strip USE TO TEST BLOOD SUGAR THREE TIMES A DAY 100 strip 3     Physical Exam: Blood pressure 129/71, pulse 86, temperature 98.8 F (37.1 C), temperature source Oral, resp. rate 15, height $RemoveBe'5\' 2"'LLiNOxGHh$  (1.575 m), weight 73.7 kg, SpO2 94 %. General: resting comfortably, appears stated age, no apparent distress Neurological: alert and oriented, no focal deficits, cranial nerves grossly in tact HEENT: normocephalic, atraumatic, oropharynx clear, no scleral icterus CV: extremities warm and well-perfused Respiratory: normal work of breathing, symmetric chest wall expansion Abdomen: soft, nondistended, focally tender in the LLQ with no underlying mass or hernia defect palpable. Well-healed midline surgical scar. Extremities: warm and well-perfused, no deformities, moving all extremities spontaneously Psychiatric: normal mood  and affect Skin: warm and dry, no jaundice, no rashes or lesions   Results for orders placed or performed during the hospital encounter of 07/07/20 (from the past 48 hour(s))  SARS CORONAVIRUS 2 (TAT 6-24 HRS) Nasopharyngeal Nasopharyngeal Swab     Status: None   Collection Time: 07/07/20  1:44 PM   Specimen: Nasopharyngeal Swab  Result Value Ref Range   SARS Coronavirus 2 NEGATIVE NEGATIVE    Comment: (NOTE) SARS-CoV-2 target nucleic acids are NOT DETECTED.  The SARS-CoV-2 RNA is generally detectable in upper and lower respiratory specimens during the acute phase of infection. Negative results do not preclude SARS-CoV-2 infection, do not rule out co-infections with other pathogens, and should not be used as the sole basis for treatment or  other patient management decisions. Negative results must be combined with clinical observations, patient history, and epidemiological information. The expected result is Negative.  Fact Sheet for Patients: SugarRoll.be  Fact Sheet for Healthcare Providers: https://www.woods-mathews.com/  This test is not yet approved or cleared by the [place] FDA and  has been authorized for detection and/or diagnosis of SARS-CoV-2 by FDA under an Emergency Use Authorization (EUA). This EUA will remain  in effect (meaning this test can be used) for the duration of the COVID-19 declaration under Se ction 564(b)(1) of the Act, 21 U.S.C. section 360bbb-3(b)(1), unless the authorization is terminated or revoked sooner.  Performed at [facility], [place] [address] [place], [place] [address]   Prepare RBC (crossmatch)     Status: None   Collection Time: 07/07/20  2:25 PM  Result Value Ref Range   Order Confirmation      ORDER PROCESSED BY BLOOD BANK Performed at [facility], [place] [address], [place], [place] [address]   Comprehensive metabolic panel     Status: Abnormal   Collection  Time: 07/07/20  3:09 PM  Result Value Ref Range   Sodium 140 135 - 145 mmol/L   Potassium 3.7 3.5 - 5.1 mmol/L   Chloride 103 98 - 111 mmol/L   CO2 28 22 - 32 mmol/L   Glucose, Bld 90 70 - 99 mg/dL    Comment: Glucose reference range applies only to samples taken after fasting for at least 8 hours.   BUN 19 8 - 23 mg/dL   Creatinine, Ser 1.59 (H) 0.44 - 1.00 mg/dL   Calcium 8.8 (L) 8.9 - 10.3 mg/dL   Total Protein 7.0 6.5 - 8.1 g/dL   Albumin 3.8 3.5 - 5.0 g/dL   AST 21 15 - 41 U/L   ALT 13 0 - 44 U/L   Alkaline Phosphatase 54 38 - 126 U/L   Total Bilirubin 0.4 0.3 - 1.2 mg/dL   GFR, Estimated 35 (L) >60 mL/min    Comment: (NOTE) Calculated using the CKD-EPI Creatinine Equation (2021)    Anion gap 9 5 - 15    Comment: Performed at [facility], [place] [address], [place], [place] [address]  Hemoglobin A1c     Status: Abnormal   Collection Time: 07/07/20  3:09 PM  Result Value Ref Range   Hgb A1c MFr Bld 4.0 (L) 4.8 - 5.6 %    Comment: (NOTE) Pre diabetes:          5.7%-6.4%  Diabetes:              >6.4%  Glycemic control for   <7.0% adults with diabetes    Mean Plasma Glucose 68.1 mg/dL    Comment: Performed at [facility] [address], [place], [place] [address]  HIV Antibody (routine testing w rflx)     Status: None   Collection Time: 07/07/20  3:09 PM  Result Value Ref Range   HIV Screen 4th Generation wRfx Non Reactive Non Reactive    Comment: Performed at [facility], [place] [address] [place], [place] [address]  Glucose, capillary     Status: None   Collection Time: 07/07/20  5:20 PM  Result Value Ref Range   Glucose-Capillary 88 70 - 99 mg/dL    Comment: Glucose reference range applies only to samples taken after fasting for at least 8 hours.   Comment 1 Notify RN    Comment 2 Document in Chart  Glucose, capillary     Status: None   Collection Time: 07/07/20  9:34 PM  Result Value Ref Range   Glucose-Capillary 73 70 - 99 mg/dL     Comment: Glucose reference range applies only to samples taken after fasting for at least 8 hours.  Type and screen [place]     Status: None   Collection Time: 07/08/20  1:05 AM  Result Value Ref Range   ABO/RH(D) AB POS    Antibody Screen NEG    Sample Expiration 07/11/2020,2359    Unit Number [id number]    Blood Component Type RED CELLS,LR    Unit division 00    Status of Unit ISSUED,FINAL    Transfusion Status OK TO TRANSFUSE    Crossmatch Result      Compatible Performed at [facility], [place] [address], [place], [place] [address]   Basic metabolic panel     Status: Abnormal   Collection Time: 07/08/20  1:07 AM  Result Value Ref Range   Sodium 137 135 - 145 mmol/L   Potassium 3.4 (L) 3.5 - 5.1 mmol/L   Chloride 104 98 - 111 mmol/L   CO2 25 22 - 32 mmol/L   Glucose, Bld 119 (H) 70 - 99 mg/dL    Comment: Glucose reference range applies only to samples taken after fasting for at least 8 hours.   BUN 20 8 - 23 mg/dL   Creatinine, Ser 1.40 (H) 0.44 - 1.00 mg/dL   Calcium 8.6 (L) 8.9 - 10.3 mg/dL   GFR, Estimated 41 (L) >60 mL/min    Comment: (NOTE) Calculated using the CKD-EPI Creatinine Equation (2021)    Anion gap 8 5 - 15    Comment: Performed at [facility], [place] [address] [place], [place] [address]  CBC     Status: Abnormal   Collection Time: 07/08/20  1:07 AM  Result Value Ref Range   WBC 7.7 4.0 - 10.5 K/uL   RBC 2.27 (L) 3.87 - 5.11 MIL/uL   Hemoglobin 7.6 (L) 12.0 - 15.0 g/dL   HCT 24.7 (L) 36.0 - 46.0 %   MCV 108.8 (H) 80.0 - 100.0 fL   MCH 33.5 26.0 - 34.0 pg   MCHC 30.8 30.0 - 36.0 g/dL   RDW 18.0 (H) 11.5 - 15.5 %   Platelets 310 150 - 400 K/uL   nRBC 0.0 0.0 - 0.2 %    Comment: Performed at [facility], [place] [address] [place], [place] [address]  Glucose, capillary     Status: Abnormal   Collection Time: 07/08/20  8:05 AM  Result Value Ref Range   Glucose-Capillary  115 (H) 70 - 99 mg/dL    Comment: Glucose reference range applies only to samples taken after fasting for at least 8 hours.   Comment 1 Notify RN    Comment 2 Document in Chart   CBC     Status: Abnormal   Collection Time: 07/08/20  9:31 AM  Result Value Ref Range   WBC 9.7 4.0 - 10.5 K/uL   RBC 3.07 (L) 3.87 - 5.11 MIL/uL   Hemoglobin 9.9 (L) 12.0 - 15.0 g/dL    Comment: REPEATED TO VERIFY POST TRANSFUSION SPECIMEN DELTA CHECK NOTED    HCT 31.4 (L) 36.0 - 46.0 %   MCV 102.3 (H) 80.0 - 100.0 fL   MCH 32.2 26.0 - 34.0 pg   MCHC 31.5 30.0 - 36.0 g/dL   RDW 20.0 (H)  11.5 - 15.5 %   Platelets 310 150 - 400 K/uL   nRBC 0.0 0.0 - 0.2 %    Comment: Performed at [facility], [place] [address] [place], [place] [address]  Differential     Status: Abnormal   Collection Time: 07/08/20  9:31 AM  Result Value Ref Range   Neutrophils Relative % 92 %   Neutro Abs 8.7 (H) 1.7 - 7.7 K/uL   Lymphocytes Relative 3 %   Lymphs Abs 0.3 (L) 0.7 - 4.0 K/uL   Monocytes Relative 3 %   Monocytes Absolute 0.2 0.1 - 1.0 K/uL   Eosinophils Relative 1 %   Eosinophils Absolute 0.1 0.0 - 0.5 K/uL   Basophils Relative 0 %   Basophils Absolute 0.0 0.0 - 0.1 K/uL   Immature Granulocytes 1 %   Abs Immature Granulocytes 0.11 (H) 0.00 - 0.07 K/uL    Comment: Performed at [facility], [place] [address], [place], [place] [address]  Glucose, capillary     Status: Abnormal   Collection Time: 07/08/20 12:16 PM  Result Value Ref Range   Glucose-Capillary 116 (H) 70 - 99 mg/dL    Comment: Glucose reference range applies only to samples taken after fasting for at least 8 hours.   Comment 1 Notify RN    Comment 2 Document in Chart   Glucose, capillary     Status: Abnormal   Collection Time: 07/08/20  2:59 PM  Result Value Ref Range   Glucose-Capillary 253 (H) 70 - 99 mg/dL    Comment: Glucose reference range applies only to samples taken after fasting for at least 8 hours.   Glucose, capillary     Status: Abnormal   Collection Time: 07/08/20 10:02 PM  Result Value Ref Range   Glucose-Capillary 171 (H) 70 - 99 mg/dL    Comment: Glucose reference range applies only to samples taken after fasting for at least 8 hours.  Basic metabolic panel     Status: Abnormal   Collection Time: 07/09/20  6:42 AM  Result Value Ref Range   Sodium 135 135 - 145 mmol/L   Potassium 3.7 3.5 - 5.1 mmol/L   Chloride 101 98 - 111 mmol/L   CO2 25 22 - 32 mmol/L   Glucose, Bld 121 (H) 70 - 99 mg/dL    Comment: Glucose reference range applies only to samples taken after fasting for at least 8 hours.   BUN 18 8 - 23 mg/dL   Creatinine, Ser 1.21 (H) 0.44 - 1.00 mg/dL   Calcium 8.8 (L) 8.9 - 10.3 mg/dL   GFR, Estimated 49 (L) >60 mL/min    Comment: (NOTE) Calculated using the CKD-EPI Creatinine Equation (2021)    Anion gap 9 5 - 15    Comment: Performed at [facility], [place] [address] [place], [place] [address]  CBC     Status: Abnormal   Collection Time: 07/09/20  6:42 AM  Result Value Ref Range   WBC 7.1 4.0 - 10.5 K/uL   RBC 2.85 (L) 3.87 - 5.11 MIL/uL   Hemoglobin 9.0 (L) 12.0 - 15.0 g/dL   HCT 28.4 (L) 36.0 - 46.0 %   MCV 99.6 80.0 - 100.0 fL   MCH 31.6 26.0 - 34.0 pg   MCHC 31.7 30.0 - 36.0 g/dL   RDW 19.4 (H) 11.5 - 15.5 %   Platelets 265 150 - 400 K/uL   nRBC 0.0 0.0 - 0.2 %    Comment:  Performed at [facility], [place] [address], [place], [place] [address]  Glucose, capillary     Status: Abnormal   Collection Time: 07/09/20  8:02 AM  Result Value Ref Range   Glucose-Capillary 130 (H) 70 - 99 mg/dL    Comment: Glucose reference range applies only to samples taken after fasting for at least 8 hours.  Glucose, capillary     Status: None   Collection Time: 07/09/20 12:38 PM  Result Value Ref Range   Glucose-Capillary 96 70 - 99 mg/dL    Comment: Glucose reference range applies only to samples taken after fasting for at least 8  hours.   *Note: Due to a large number of results and/or encounters for the requested time period, some results have not been displayed. A complete set of results can be found in Results Review.   CT ABDOMEN PELVIS W CONTRAST  Result Date: 07/09/2020 CLINICAL DATA:  Abdominal pain since last evening. EXAM: CT ABDOMEN AND PELVIS WITH CONTRAST TECHNIQUE: Multidetector CT imaging of the abdomen and pelvis was performed using the standard protocol following bolus administration of intravenous contrast. CONTRAST:  6mL OMNIPAQUE IOHEXOL 300 MG/ML  SOLN COMPARISON:  CT scan 11/11/2019 FINDINGS: Lower chest: Stable bibasilar scarring changes. No infiltrates or effusions. The heart is borderline enlarged but stable. Age advanced aortic calcifications. Hepatobiliary: No hepatic lesions or intrahepatic biliary dilatation. Gallbladder is unremarkable. No common bile duct dilatation. Pancreas: No mass, inflammation or ductal dilatation. Spleen: Normal size.  No focal lesions. Adrenals/Urinary Tract: Adrenal glands are unremarkable. Small scattered renal cysts but no worrisome renal lesions or hydronephrosis. The bladder is unremarkable. Stomach/Bowel: The stomach, duodenum, small bowel and colon are unremarkable. No acute inflammatory changes, mass lesions or obstructive findings. Stable surgical changes involving the sigmoid colon. The terminal ileum and appendix are normal. Vascular/Lymphatic: Stable age advanced atherosclerotic calcifications involving the abdominal aorta and iliac arteries. No aneurysm. The major venous structures are patent. Small scattered mesenteric and retroperitoneal lymph nodes but no mass or adenopathy. Reproductive: Surgically absent. Other: No pelvic mass or adenopathy. No free pelvic fluid collections. No inguinal mass or adenopathy. No abdominal wall hernia or subcutaneous lesions. Postoperative changes related to prior midline abdominal surgery with fairly extensive scar tissue but no  abdominal wall hernia. Musculoskeletal: No significant bony findings. IMPRESSION: 1. No acute abdominal/pelvic findings, mass lesions or adenopathy. 2. Stable age advanced atherosclerotic calcifications involving the abdominal aorta and iliac arteries. 3. Stable surgical changes involving the sigmoid colon. 4. Aortic atherosclerosis. Aortic Atherosclerosis (ICD10-I70.0). Electronically Signed   By: Marijo Sanes M.D.   On: 07/09/2020 12:54      Assessment/Plan 70 yo female with a history of prior incisional hernia repair, presenting with acute onset LLQ pain following upper endoscopy. I personally reviewed her CT scan. No abdominal wall defects are present, no abnormalities in the LLQ abdominal wall. Small bowel is normal in caliber with no signs of obstruction. No inflammation around the colon. No pneumoperitoneum. There are no findings to explain her current pain. She is nontoxic on exam. Ok for diet as tolerated. No indication for acute surgical interventions.   Michaelle Birks, [facility], Hepatobiliary and Pancreatic Surgery 07/09/20 1:09 PM

## 2020-07-09 NOTE — Progress Notes (Addendum)
PROGRESS NOTE    [name]  ZWC:[id number] DOB: [birth year] DOA: 07/07/2020 PCP: Jose Persia, MD   Brief Narrative: 70 year old female with history of iron deficiency anemia requiring iron infusion and periodic prbc transfusion likely secondary to GI AVMs, diabetes, left AKA with prosthesis and wheelchair-bound, HCV s/p treatment, porphyria cutaneous tarda, hypertension, PVD, CVA without residual deficit, substance abuse disorder presented as a direct admission from Dr. Fritz Pickerel office at the cancer center where she was following for anemia. As per the report Patient was seen in the cancer center a week  PTA for worsening anemia and was urgently referred to her GI doctor, Dr. Loletha Carrow 2 days PTA, Dr. Loletha Carrow recommended hospital admission for GI bleeding work-up however she was unable to get to the ED due to her sister's funeral 2/9.Patient followed up in the cancer center 2/10- repeat lab work w/ low hb and sent to ED as direct hospital admission.  She has been having 1 to black stools every day for the past week most notably in the morning.  Has moderate fatigue but is still able to do her ADLs.  Denies any acute abdominal pain.  She does take baby aspirin and most recent dose was 2/10 night  Subjective: Seen this morning complains of pain on left lower quadrant, has had pain yesterday to  Reports she spoke to her GI doctor this morning with planning for CT abdomen.   Assessment & Plan:  Acute on chronic anemia in the setting of GI bleed with history of AVM and iron deficiency: Underwent EGD enteroscopy and had single AVM that was treated.  Unclear if that is the source of melena and anemia, a spot was tattooed.  Continue PPI, hemoglobin has improved appropriately with 1 unit PRBC transfusion.monitor H&H.  Recent Labs  Lab 07/04/20 1009 07/07/20 1155 07/08/20 0107 07/08/20 0931 07/09/20 0642  HGB 7.8* 7.7* 7.6* 9.9* 9.0*  HCT 25.2* 24.5* 24.7* 31.4* 28.4*   Abdominal pain, with history  of hernia,?incarcerated abdominal wall hernia, possibly related to ELN to this during SBE yesterday as per GI and getting CT abdomen stat and GI is also alerting surgery.  Continue n.p.o. for now, will add gentle IV fluids.  Essential hypertension, benign: Blood pressure stable holding amlodipine.  AKI on CKD IIIa, baseline creat 1.1 peaked to 1.5 downtrending we will keep on gentle IV fluids.   Recent Labs    01/28/20 1016 02/11/20 1040 02/25/20 1021 03/11/20 0843 03/23/20 0914 04/06/20 1023 04/27/20 1009 07/07/20 1509 07/08/20 0107 07/09/20 0642  BUN 14 13 15 16 15 14 15 19 20 18   CREATININE 1.08* 1.17* 1.22* 1.23* 1.14* 1.18* 1.17* 1.59* 1.40* 1.21*   Type 2 diabetes mellitus with diabetic neuropathy: Blood sugar is fairly controlled.  Cont ssi, Neurontin.  Recent Labs  Lab 07/08/20 0805 07/08/20 1216 07/08/20 1459 07/08/20 2202 07/09/20 0802  GLUCAP 115* 116* 253* 171* 130*   Iron deficiency anemia due to chronic blood loss: Home aspirin on hold. Se #1  PVD holding home aspirin for now due to GI bleed and anemia.  Gout not in flare. Home meds on hold.  Overweight with BMI 29.7 outpatient weight loss and PCP follow-up  Nutrition: Diet Order            Diet NPO time specified  Diet effective now               Pt's Body mass index is 29.72 kg/m.  DVT prophylaxis: SCDs Start: 07/07/20 1448 Code Status:  Code Status: Full Code  Family Communication: plan of care discussed with patient at bedside.  Status is: admitted as Observation  Patient continues to require hospitalization for ongoing management of anemia and further GI work-up- had egd , needs CT due to onging abd pain  Dispo: The patient is from: Home              Anticipated d/c is to: Home              Anticipated d/c date is: 1 day              Patient currently is not medically stable to d/c.   Difficult to place patient No  Consultants:see note  Procedures:see note  Culture/Microbiology     Component Value Date/Time   SDES BLOOD RIGHT ANTECUBITAL 08/17/2016 2207   SPECREQUEST BOTTLES DRAWN AEROBIC AND ANAEROBIC 5ML EA 08/17/2016 2207   CULT  08/17/2016 2207    NO GROWTH 5 DAYS Performed at [facility] [address], [place], [place] [address]    REPTSTATUS 08/23/2016 FINAL 08/17/2016 2207    Other culture-see note  Medications: Scheduled Meds: . gabapentin  400 mg Oral BID  . gabapentin  800 mg Oral QHS  . insulin aspart  0-5 Units Subcutaneous QHS  . insulin aspart  0-9 Units Subcutaneous TID WC  . iohexol  500 mL Oral Q1H  . pantoprazole  40 mg Oral BID AC   Continuous Infusions:  Antimicrobials: Anti-infectives (From admission, onward)   None     Objective: Vitals: Today's Vitals   07/08/20 2036 07/08/20 2200 07/09/20 0251 07/09/20 0429  BP: 129/71     Pulse: 86     Resp: 15     Temp: 98.8 F (37.1 C)     TempSrc: Oral     SpO2: 94%     Weight:      Height:      PainSc:  3  10-Worst pain ever 5     Intake/Output Summary (Last 24 hours) at 07/09/2020 1110 Last data filed at 07/08/2020 1627 Gross per 24 hour  Intake 1040 ml  Output 0 ml  Net 1040 ml   Filed Weights   07/07/20 1628  Weight: 73.7 kg   Weight change:   Intake/Output from previous day: 02/11 0701 - 02/12 0700 In: 1474.2 [P.O.:240; I.V.:800; Blood:434.2] Out: 0  Intake/Output this shift: No intake/output data recorded. Filed Weights   07/07/20 1628  Weight: 73.7 kg    Examination:  General exam: AAOx3, in pain, cleaning herself on the bedside chair. HEENT:Oral mucosa moist, Ear/Nose WNL grossly, dentition normal. Respiratory system: bilaterally diminished at base, no crackles,no use of accessory muscle Cardiovascular system: S1 & S2 +, No JVD,. Gastrointestinal system: Abdomen soft, tenderness present in the left abdomen, has abdomen wall/skin fold on b/l with old mid abdominal scar. Nervous System:Alert, awake, moving extremities and grossly  nonfocal Extremities: No edema, distal peripheral pulses palpable.  Skin: No rashes,no icterus. MSK: Normal muscle bulk,tone, power  Data Reviewed: I have personally reviewed following labs and imaging studies CBC: Recent Labs  Lab 07/04/20 1009 07/07/20 1155 07/08/20 0107 07/08/20 0931 07/09/20 0642  WBC 7.3 8.9 7.7 9.7 7.1  NEUTROABS 5.4 7.3  --  8.7*  --   HGB 7.8* 7.7* 7.6* 9.9* 9.0*  HCT 25.2* 24.5* 24.7* 31.4* 28.4*  MCV 104.1* 103.8* 108.8* 102.3* 99.6  PLT 333 340 310 310 027   Basic Metabolic Panel: Recent Labs  Lab 07/07/20 1509  07/08/20 0107 07/09/20 0642  NA 140 137 135  K 3.7 3.4* 3.7  CL 103 104 101  CO2 28 25 25   GLUCOSE 90 119* 121*  BUN 19 20 18   CREATININE 1.59* 1.40* 1.21*  CALCIUM 8.8* 8.6* 8.8*   GFR: Estimated Creatinine Clearance: 42.4 mL/min (A) (by C-G formula based on SCr of 1.21 mg/dL (H)). Liver Function Tests: Recent Labs  Lab 07/07/20 1509  AST 21  ALT 13  ALKPHOS 54  BILITOT 0.4  PROT 7.0  ALBUMIN 3.8   No results for input(s): LIPASE, AMYLASE in the last 168 hours. No results for input(s): AMMONIA in the last 168 hours. Coagulation Profile: No results for input(s): INR, PROTIME in the last 168 hours. Cardiac Enzymes: No results for input(s): CKTOTAL, CKMB, CKMBINDEX, TROPONINI in the last 168 hours. BNP (last 3 results) No results for input(s): PROBNP in the last 8760 hours. HbA1C: Recent Labs    07/07/20 1509  HGBA1C 4.0*   CBG: Recent Labs  Lab 07/08/20 0805 07/08/20 1216 07/08/20 1459 07/08/20 2202 07/09/20 0802  GLUCAP 115* 116* 253* 171* 130*   Lipid Profile: No results for input(s): CHOL, HDL, LDLCALC, TRIG, CHOLHDL, LDLDIRECT in the last 72 hours. Thyroid Function Tests: No results for input(s): TSH, T4TOTAL, FREET4, T3FREE, THYROIDAB in the last 72 hours. Anemia Panel: Recent Labs    07/07/20 1155  FERRITIN 214   Sepsis Labs: No results for input(s): PROCALCITON, LATICACIDVEN in the last 168  hours.  Recent Results (from the past 240 hour(s))  SARS CORONAVIRUS 2 (TAT 6-24 HRS) Nasopharyngeal Nasopharyngeal Swab     Status: None   Collection Time: 07/07/20  1:44 PM   Specimen: Nasopharyngeal Swab  Result Value Ref Range Status   SARS Coronavirus 2 NEGATIVE NEGATIVE Final    Comment: (NOTE) SARS-CoV-2 target nucleic acids are NOT DETECTED.  The SARS-CoV-2 RNA is generally detectable in upper and lower respiratory specimens during the acute phase of infection. Negative results do not preclude SARS-CoV-2 infection, do not rule out co-infections with other pathogens, and should not be used as the sole basis for treatment or other patient management decisions. Negative results must be combined with clinical observations, patient history, and epidemiological information. The expected result is Negative.  Fact Sheet for Patients: SugarRoll.be  Fact Sheet for Healthcare Providers: https://www.woods-mathews.com/  This test is not yet approved or cleared by the [place] FDA and  has been authorized for detection and/or diagnosis of SARS-CoV-2 by FDA under an Emergency Use Authorization (EUA). This EUA will remain  in effect (meaning this test can be used) for the duration of the COVID-19 declaration under Se ction 564(b)(1) of the Act, 21 U.S.C. section 360bbb-3(b)(1), unless the authorization is terminated or revoked sooner.  Performed at [facility], [place] [address] [place], [place] [address]      Radiology Studies: No results found.   LOS: 1 day   Antonieta Pert, MD [facility]  07/09/2020, 11:10 AM

## 2020-07-10 DIAGNOSIS — K922 Gastrointestinal hemorrhage, unspecified: Secondary | ICD-10-CM

## 2020-07-10 DIAGNOSIS — I10 Essential (primary) hypertension: Secondary | ICD-10-CM

## 2020-07-10 LAB — BASIC METABOLIC PANEL
Anion gap: 9 (ref 5–15)
BUN: 17 mg/dL (ref 8–23)
CO2: 26 mmol/L (ref 22–32)
Calcium: 9.1 mg/dL (ref 8.9–10.3)
Chloride: 103 mmol/L (ref 98–111)
Creatinine, Ser: 1.1 mg/dL — ABNORMAL HIGH (ref 0.44–1.00)
GFR, Estimated: 55 mL/min — ABNORMAL LOW (ref >60)
Glucose, Bld: 133 mg/dL — ABNORMAL HIGH (ref 70–99)
Potassium: 3.5 mmol/L (ref 3.5–5.1)
Sodium: 138 mmol/L (ref 135–145)

## 2020-07-10 LAB — GLUCOSE, CAPILLARY
Glucose-Capillary: 118 mg/dL — ABNORMAL HIGH (ref 70–99)
Glucose-Capillary: 94 mg/dL (ref 70–99)

## 2020-07-10 LAB — CBC
HCT: 29.2 % — ABNORMAL LOW (ref 36.0–46.0)
Hemoglobin: 9.3 g/dL — ABNORMAL LOW (ref 12.0–15.0)
MCH: 31.8 pg (ref 26.0–34.0)
MCHC: 31.8 g/dL (ref 30.0–36.0)
MCV: 100 fL (ref 80.0–100.0)
Platelets: 268 10*3/uL (ref 150–400)
RBC: 2.92 MIL/uL — ABNORMAL LOW (ref 3.87–5.11)
RDW: 18.8 % — ABNORMAL HIGH (ref 11.5–15.5)
WBC: 5.5 10*3/uL (ref 4.0–10.5)
nRBC: 0 % (ref 0.0–0.2)

## 2020-07-10 NOTE — Discharge Summary (Signed)
Physician Discharge Summary  [name] UKG:[id number] DOB: 07-28-1950 DOA: 07/07/2020  PCP: Jose Persia, MD  Admit date: 07/07/2020 Discharge date: 07/10/2020  Admitted From: home Disposition:  home  Recommendations for Outpatient Follow-up:  1. Follow up with PCP in 1-2 weeks 2. Please obtain BMP/CBC in one week 3. Please follow up on the following pending results:  Home Health:no  Equipment/Devices: none  Discharge Condition: Stable Code Status:   Code Status: Full Code Diet recommendation:  Diet Order            Diet Carb Modified           DIET SOFT Room service appropriate? Yes; Fluid consistency: Thin  Diet effective now           Diet Carb Modified                  Brief/Interim Summary: 70 year old female with history of iron deficiency anemia requiring iron infusion and periodic prbc transfusion likely secondary to GI AVMs, diabetes, left AKA with prosthesis and wheelchair-bound, HCV s/p treatment, porphyria cutaneous tarda, hypertension, PVD, CVA without residual deficit, substance abuse disorder presented as a direct admission from Dr. Fritz Pickerel office at the cancer center where she was following for anemia. As per the report Patient was seen in the cancer center a week  PTA for worsening anemia and was urgently referred to her GI doctor, Dr. Sanjuana Letters days PTA,Dr. Danisrecommended hospital admission for GI bleeding work-up however she was unable to get to the ED due to her sister's funeral 2/9.Patient followed up in the cancer center 2/10- repeat lab work w/ low hb and sent to ED as direct hospital admission. She has been having 1 to black stools every day for the past week most notably in the morning. Has moderate fatigue but is still able to do her ADLs.Denies any acute abdominal pain. She does take baby aspirin and most recent dose was 2/10. Patient was admitted underwent transfusion unit PRBC hemoglobin appropriately increased.  She underwent enteroscopy and  1 AVM was cauterized. She was monitored additional days as she also had issue with severe abdominal pain post endoscopy concerning for her hernia incarceration and had CT abdomen afterwards her abdominal pain resolved.  CT abdomen came back. seen by surgery.  At this time she is tolerating diet no further issues and she has been cleared for discharge home today.  Discharge Diagnoses:   Acute on chronic anemia in the setting of GI bleed with history of AVM and iron deficiency: Underwent EGD enteroscopy and had single AVM that was treated.  Unclear if that is the source of melena and anemia, a spot was tattooed.  Continue PPI, hemoglobin has improved appropriately with 1 unit PRBC .  Seen by GI this morning tolerating diet.  She is okay for discharge home today okay to resume her aspirin. Recent Labs  Lab 07/07/20 1155 07/08/20 0107 07/08/20 0931 07/09/20 0642 07/10/20 0600  HGB 7.7* 7.6* 9.9* 9.0* 9.3*  HCT 24.5* 24.7* 31.4* 28.4* 29.2*   Abdominal pain, with history of hernia,?incarcerated abdominal wall hernia,  resolved after CT abdomen contrast wondering if oral contrast resolved. At this time she is tolerating diet.  Seen by surgery.  No further issues.  She is okay for discharge home today.    Essential hypertension, benign: Resume home meds on discharge home AKI on CKD IIIa, baseline creat 1.1 peaked to 1.5 downtrending.  Hydrate orally. Recent Labs  Lab 07/07/20 1509 07/08/20 0107 07/09/20 7628 07/10/20  0600  BUN $Re'19 20 18 17  'vRW$ CREATININE 1.59* 1.40* 1.21* 1.10*   Type 2 diabetes mellitus with diabetic neuropathy: Stable continue her home meds upon discharge  Iron deficiency anemia due to chronic blood loss: SEE #1 PVD COTN HOME ASA Gout not in flare.cont home meds Overweight with BMI 29.7 outpatient weight loss and PCP follow-up  Consults:  gi  Subjective: No abd pain, tolerating diet, no nausea or vomiting, wants to go home  Discharge Exam: Vitals:   07/09/20 2126  07/10/20 0515  BP: 139/88 133/79  Pulse: 85 90  Resp: 18 16  Temp: 97.6 F (36.4 C) 98.2 F (36.8 C)  SpO2: 99% 99%   General: Pt is alert, awake, not in acute distress Cardiovascular: RRR, S1/S2 +, no rubs, no gallops Respiratory: CTA bilaterally, no wheezing, no rhonchi Abdominal: Soft, NT, ND, bowel sounds + Extremities: no edema, no cyanosis  Discharge Instructions  Discharge Instructions    Diet Carb Modified   Complete by: As directed    Diet Carb Modified   Complete by: As directed    Discharge instructions   Complete by: As directed    Once you are discharged:Please call call MD or return to ER for similar or worsening recurring problem that brought you to hospital or if any fever,nausea/vomiting,abdominal pain, uncontrolled pain, chest pain,  shortness of breath or any other alarming symptoms.  Please follow-up your doctor as instructed in a week time and call the office for appointment.  Please avoid alcohol, smoking, or any other illicit substance and maintain healthy habits including taking your regular medications as prescribed.  You were cared for by a hospitalist during your hospital stay. If you have any questions about your discharge medications or the care you received while you were in the hospital after you are discharged, you can call the unit and ask to speak with the hospitalist on call if the hospitalist that took care of you is not available. Once you are discharged, your primary care physician will handle any further medical issues. Please note that NO REFILLS for any discharge medications will be authorized once you are discharged, as it is imperative that you return to your primary care physician (or establish a relationship with a primary care physician if you do not have one) for your aftercare needs so that they can reassess your need for medications and monitor your lab values   Discharge instructions   Complete by: As directed    Please call call MD  or return to ER for similar or worsening recurring problem that brought you to hospital or if any fever,nausea/vomiting,abdominal pain, uncontrolled pain, chest pain,  shortness of breath or any other alarming symptoms.  Please follow-up your doctor as instructed in a week time and call the office for appointment. Please check CBC in 1 week  Please avoid alcohol, smoking, or any other illicit substance and maintain healthy habits including taking your regular medications as prescribed.  You were cared for by a hospitalist during your hospital stay. If you have any questions about your discharge medications or the care you received while you were in the hospital after you are discharged, you can call the unit and ask to speak with the hospitalist on call if the hospitalist that took care of you is not available.  Once you are discharged, your primary care physician will handle any further medical issues. Please note that NO REFILLS for any discharge medications will be authorized once you are discharged, as  it is imperative that you return to your primary care physician (or establish a relationship with a primary care physician if you do not have one) for your aftercare needs so that they can reassess your need for medications and monitor your lab values   Increase activity slowly   Complete by: As directed      Allergies as of 07/10/2020      Reactions   Ciprofloxacin Hives, Swelling   Morphine And Related Other (See Comments)   Makes pt sad and paranoid   Penicillins Hives, Swelling   Has patient had a PCN reaction causing immediate rash, facial/tongue/throat swelling, SOB or lightheadedness with hypotension:YES Has patient had a PCN reaction causing severe rash involving mucus membranes or skin necrosis:UNSURE Has patient had a PCN reaction that required hospitalization:YES Has patient had a PCN reaction occurring within the last 10 years:No If all of the above answers are "NO", then may  proceed with Cephalosporin use.   Codeine Hives, Swelling   Lisinopril Cough   New onset dry cough after starting lisinopril. Resolved upon switching to losartan.   Feraheme [ferumoxytol] Itching   Tolerated Feraheme 6/26 & 7/21 pre-medications prior to medication      Medication List    TAKE these medications   Accu-Chek FastClix Lancets Misc Use to test blood glucose 3 times daily.   Accu-Chek Guide test strip Generic drug: glucose blood USE TO TEST BLOOD SUGAR THREE TIMES A DAY   Accu-Chek Guide w/Device Kit 1 each by Does not apply route 3 (three) times daily. The patient is insulin requiring, ICD 10 code E11.65. The patient tests 3 times a day.   acetaminophen 500 MG tablet Commonly known as: TYLENOL Take 1,000 mg by mouth every 6 (six) hours as needed.   allopurinol 100 MG tablet Commonly known as: ZYLOPRIM TAKE 1 TABLET (100 MG TOTAL) BY MOUTH DAILY.   amLODipine 10 MG tablet Commonly known as: NORVASC Take 1 tablet (10 mg total) by mouth daily.   aspirin EC 81 MG tablet Take 1 tablet (81 mg total) by mouth daily.   cetirizine 10 MG tablet Commonly known as: ZYRTEC Take 1 tablet (10 mg total) by mouth daily.   cholecalciferol 25 MCG (1000 UNIT) tablet Commonly known as: VITAMIN D Take 1 tablet (1,000 Units total) by mouth daily.   colchicine 0.6 MG tablet TAKE 1 TABLET (0.6 MG TOTAL) BY MOUTH DAILY. What changed:   when to take this  reasons to take this   feeding supplement (GLUCERNA SHAKE) Liqd Take 237 mLs by mouth 3 (three) times daily between meals. What changed: when to take this   gabapentin 400 MG capsule Commonly known as: NEURONTIN Take 1-2 capsules (400-800 mg total) by mouth See admin instructions. Take 1 capsule ($RemoveBe'400mg'ckcdyElch$ ) in the morning, 1 capsule ($RemoveBe'400mg'FPBwgkGkr$ ) in the afternoon and 2  Capsules ($RemoveBe'800mg'HNhrIBjbd$ ) before bedtime What changed:   when to take this  additional instructions   losartan 100 MG tablet Commonly known as: COZAAR Take 1 tablet  (100 mg total) by mouth daily.   metFORMIN 1000 MG tablet Commonly known as: GLUCOPHAGE TAKE 1 TABLET BY MOUTH TWICE A DAY WITH A MEAL What changed: See the new instructions.   omeprazole 20 MG capsule Commonly known as: PRILOSEC Take 1 capsule (20 mg total) by mouth daily.   Trulicity 6.76 HM/0.9OB Sopn Generic drug: Dulaglutide Inject 0.5 mLs (0.75 mg total) into the skin once a week.       Follow-up Information  Jose Persia, MD Follow up in 1 week(s).   Specialty: Internal Medicine Contact information: [address] [address] [phone]              Allergies  Allergen Reactions  . Ciprofloxacin Hives and Swelling  . Morphine And Related Other (See Comments)    Makes pt sad and paranoid  . Penicillins Hives and Swelling    Has patient had a PCN reaction causing immediate rash, facial/tongue/throat swelling, SOB or lightheadedness with hypotension:YES Has patient had a PCN reaction causing severe rash involving mucus membranes or skin necrosis:UNSURE Has patient had a PCN reaction that required hospitalization:YES Has patient had a PCN reaction occurring within the last 10 years:No If all of the above answers are "NO", then may proceed with Cephalosporin use.   . Codeine Hives and Swelling  . Lisinopril Cough    New onset dry cough after starting lisinopril. Resolved upon switching to losartan.  Shirlean Kelly [Ferumoxytol] Itching    Tolerated Feraheme 6/26 & 7/21 pre-medications prior to medication    The results of significant diagnostics from this hospitalization (including imaging, microbiology, ancillary and laboratory) are listed below for reference.    Microbiology: Recent Results (from the past 240 hour(s))  SARS CORONAVIRUS 2 (TAT 6-24 HRS) Nasopharyngeal Nasopharyngeal Swab     Status: None   Collection Time: 07/07/20  1:44 PM   Specimen: Nasopharyngeal Swab  Result Value Ref Range Status   SARS Coronavirus 2  NEGATIVE NEGATIVE Final    Comment: (NOTE) SARS-CoV-2 target nucleic acids are NOT DETECTED.  The SARS-CoV-2 RNA is generally detectable in upper and lower respiratory specimens during the acute phase of infection. Negative results do not preclude SARS-CoV-2 infection, do not rule out co-infections with other pathogens, and should not be used as the sole basis for treatment or other patient management decisions. Negative results must be combined with clinical observations, patient history, and epidemiological information. The expected result is Negative.  Fact Sheet for Patients: SugarRoll.be  Fact Sheet for Healthcare Providers: https://www.woods-mathews.com/  This test is not yet approved or cleared by the [place] FDA and  has been authorized for detection and/or diagnosis of SARS-CoV-2 by FDA under an Emergency Use Authorization (EUA). This EUA will remain  in effect (meaning this test can be used) for the duration of the COVID-19 declaration under Se ction 564(b)(1) of the Act, 21 U.S.C. section 360bbb-3(b)(1), unless the authorization is terminated or revoked sooner.  Performed at [facility], [place] [address], [place], [place] [address]     Procedures/Studies: CT ABDOMEN PELVIS W CONTRAST  Result Date: 07/09/2020 CLINICAL DATA:  Abdominal pain since last evening. EXAM: CT ABDOMEN AND PELVIS WITH CONTRAST TECHNIQUE: Multidetector CT imaging of the abdomen and pelvis was performed using the standard protocol following bolus administration of intravenous contrast. CONTRAST:  110mL OMNIPAQUE IOHEXOL 300 MG/ML  SOLN COMPARISON:  CT scan 11/11/2019 FINDINGS: Lower chest: Stable bibasilar scarring changes. No infiltrates or effusions. The heart is borderline enlarged but stable. Age advanced aortic calcifications. Hepatobiliary: No hepatic lesions or intrahepatic biliary dilatation. Gallbladder is unremarkable. No common bile duct  dilatation. Pancreas: No mass, inflammation or ductal dilatation. Spleen: Normal size.  No focal lesions. Adrenals/Urinary Tract: Adrenal glands are unremarkable. Small scattered renal cysts but no worrisome renal lesions or hydronephrosis. The bladder is unremarkable. Stomach/Bowel: The stomach, duodenum, small bowel and colon are unremarkable. No acute inflammatory changes, mass lesions or obstructive findings. Stable surgical changes involving the  sigmoid colon. The terminal ileum and appendix are normal. Vascular/Lymphatic: Stable age advanced atherosclerotic calcifications involving the abdominal aorta and iliac arteries. No aneurysm. The major venous structures are patent. Small scattered mesenteric and retroperitoneal lymph nodes but no mass or adenopathy. Reproductive: Surgically absent. Other: No pelvic mass or adenopathy. No free pelvic fluid collections. No inguinal mass or adenopathy. No abdominal wall hernia or subcutaneous lesions. Postoperative changes related to prior midline abdominal surgery with fairly extensive scar tissue but no abdominal wall hernia. Musculoskeletal: No significant bony findings. IMPRESSION: 1. No acute abdominal/pelvic findings, mass lesions or adenopathy. 2. Stable age advanced atherosclerotic calcifications involving the abdominal aorta and iliac arteries. 3. Stable surgical changes involving the sigmoid colon. 4. Aortic atherosclerosis. Aortic Atherosclerosis (ICD10-I70.0). Electronically Signed   By: Marijo Sanes M.D.   On: 07/09/2020 12:54    Labs: BNP (last 3 results) No results for input(s): BNP in the last 8760 hours. Basic Metabolic Panel: Recent Labs  Lab 07/07/20 1509 07/08/20 0107 07/09/20 0642 07/10/20 0600  NA 140 137 135 138  K 3.7 3.4* 3.7 3.5  CL 103 104 101 103  CO2 $Re'28 25 25 26  'OoE$ GLUCOSE 90 119* 121* 133*  BUN $Re'19 20 18 17  'dXc$ CREATININE 1.59* 1.40* 1.21* 1.10*  CALCIUM 8.8* 8.6* 8.8* 9.1   Liver Function Tests: Recent Labs  Lab  07/07/20 1509  AST 21  ALT 13  ALKPHOS 54  BILITOT 0.4  PROT 7.0  ALBUMIN 3.8   No results for input(s): LIPASE, AMYLASE in the last 168 hours. No results for input(s): AMMONIA in the last 168 hours. CBC: Recent Labs  Lab 07/04/20 1009 07/07/20 1155 07/08/20 0107 07/08/20 0931 07/09/20 0642 07/10/20 0600  WBC 7.3 8.9 7.7 9.7 7.1 5.5  NEUTROABS 5.4 7.3  --  8.7*  --   --   HGB 7.8* 7.7* 7.6* 9.9* 9.0* 9.3*  HCT 25.2* 24.5* 24.7* 31.4* 28.4* 29.2*  MCV 104.1* 103.8* 108.8* 102.3* 99.6 100.0  PLT 333 340 310 310 265 268   Cardiac Enzymes: No results for input(s): CKTOTAL, CKMB, CKMBINDEX, TROPONINI in the last 168 hours. BNP: Invalid input(s): POCBNP CBG: Recent Labs  Lab 07/09/20 0802 07/09/20 1238 07/09/20 1702 07/09/20 2224 07/10/20 0757  GLUCAP 130* 96 97 175* 118*   D-Dimer No results for input(s): DDIMER in the last 72 hours. Hgb A1c Recent Labs    07/07/20 1509  HGBA1C 4.0*   Lipid Profile No results for input(s): CHOL, HDL, LDLCALC, TRIG, CHOLHDL, LDLDIRECT in the last 72 hours. Thyroid function studies No results for input(s): TSH, T4TOTAL, T3FREE, THYROIDAB in the last 72 hours.  Invalid input(s): FREET3 Anemia work up Recent Labs    07/07/20 1155  FERRITIN 214   Urinalysis    Component Value Date/Time   COLORURINE STRAW (A) 08/17/2016 2148   APPEARANCEUR Clear 07/15/2019 1603   LABSPEC 1.022 08/17/2016 2148   PHURINE 5.0 08/17/2016 2148   GLUCOSEU Negative 07/15/2019 1603   HGBUR SMALL (A) 08/17/2016 2148   BILIRUBINUR Negative 07/15/2019 1603   BILIRUBINUR Negative 07/15/2019 1509   KETONESUR 5 (A) 08/17/2016 2148   PROTEINUR 3+ (A) 07/15/2019 1603   PROTEINUR Positive (A) 07/15/2019 1509   PROTEINUR 30 (A) 08/17/2016 2148   UROBILINOGEN 0.2 07/15/2019 1509   UROBILINOGEN 1.0 10/24/2014 1555   NITRITE Negative 07/15/2019 1603   NITRITE Negative 07/15/2019 1509   NITRITE NEGATIVE 08/17/2016 2148   LEUKOCYTESUR Negative  07/15/2019 1603   LEUKOCYTESUR Negative 07/15/2019 1509   Sepsis  Labs Invalid input(s): PROCALCITONIN,  WBC,  LACTICIDVEN Microbiology Recent Results (from the past 240 hour(s))  SARS CORONAVIRUS 2 (TAT 6-24 HRS) Nasopharyngeal Nasopharyngeal Swab     Status: None   Collection Time: 07/07/20  1:44 PM   Specimen: Nasopharyngeal Swab  Result Value Ref Range Status   SARS Coronavirus 2 NEGATIVE NEGATIVE Final    Comment: (NOTE) SARS-CoV-2 target nucleic acids are NOT DETECTED.  The SARS-CoV-2 RNA is generally detectable in upper and lower respiratory specimens during the acute phase of infection. Negative results do not preclude SARS-CoV-2 infection, do not rule out co-infections with other pathogens, and should not be used as the sole basis for treatment or other patient management decisions. Negative results must be combined with clinical observations, patient history, and epidemiological information. The expected result is Negative.  Fact Sheet for Patients: SugarRoll.be  Fact Sheet for Healthcare Providers: https://www.woods-mathews.com/  This test is not yet approved or cleared by the [place] FDA and  has been authorized for detection and/or diagnosis of SARS-CoV-2 by FDA under an Emergency Use Authorization (EUA). This EUA will remain  in effect (meaning this test can be used) for the duration of the COVID-19 declaration under Se ction 564(b)(1) of the Act, 21 U.S.C. section 360bbb-3(b)(1), unless the authorization is terminated or revoked sooner.  Performed at [facility], [place] [address], [place], [place] [address]      Time coordinating discharge: 25 minutes  SIGNED: Antonieta Pert, MD  [facility] 07/10/2020, 10:49 AM  If 7PM-7AM, please contact night-coverage [email or web address]

## 2020-07-10 NOTE — Progress Notes (Signed)
Patient discharged to home, discharge instructions reviewed with patient who verbalized understanding. No new medications.

## 2020-07-10 NOTE — Progress Notes (Signed)
Progress Note   Subjective  Chief Complaint: Melena, anemia  This morning patient is sitting up in her chair and is completely happy, no further abdominal pain. In fact tells me that when she is drinking the contrast for CT her abdominal pain is resolving. She has had a good bowel movement no further melena. She is eager to go home.   Objective   Vital signs in last 24 hours: Temp:  [97.6 F (36.4 C)-98.4 F (36.9 C)] 98.2 F (36.8 C) (02/13 0515) Pulse Rate:  [85-90] 90 (02/13 0515) Resp:  [16-18] 16 (02/13 0515) BP: (133-139)/(79-89) 133/79 (02/13 0515) SpO2:  [99 %-100 %] 99 % (02/13 0515) Last BM Date: 07/09/20 General:    AA female in NAD Heart:  Regular rate and rhythm; no murmurs Lungs: Respirations even and unlabored, lungs CTA bilaterally Abdomen:  Soft, nontender and nondistended. Normal bowel sounds. Psych:  Cooperative. Normal mood and affect.  Intake/Output from previous day: 02/12 0701 - 02/13 0700 In: 600 [P.O.:600] Out: -  Intake/Output this shift: Total I/O In: 120 [P.O.:120] Out: -   Lab Results: Recent Labs    07/08/20 0931 07/09/20 0642 07/10/20 0600  WBC 9.7 7.1 5.5  HGB 9.9* 9.0* 9.3*  HCT 31.4* 28.4* 29.2*  PLT 310 265 268   BMET Recent Labs    07/08/20 0107 07/09/20 0642 07/10/20 0600  NA 137 135 138  K 3.4* 3.7 3.5  CL 104 101 103  CO2 25 25 26   GLUCOSE 119* 121* 133*  BUN 20 18 17   CREATININE 1.40* 1.21* 1.10*  CALCIUM 8.6* 8.8* 9.1   LFT Recent Labs    07/07/20 1509  PROT 7.0  ALBUMIN 3.8  AST 21  ALT 13  ALKPHOS 54  BILITOT 0.4   Studies/Results: CT ABDOMEN PELVIS W CONTRAST  Result Date: 07/09/2020 CLINICAL DATA:  Abdominal pain since last evening. EXAM: CT ABDOMEN AND PELVIS WITH CONTRAST TECHNIQUE: Multidetector CT imaging of the abdomen and pelvis was performed using the standard protocol following bolus administration of intravenous contrast. CONTRAST:  68mL OMNIPAQUE IOHEXOL 300 MG/ML  SOLN COMPARISON:   CT scan 11/11/2019 FINDINGS: Lower chest: Stable bibasilar scarring changes. No infiltrates or effusions. The heart is borderline enlarged but stable. Age advanced aortic calcifications. Hepatobiliary: No hepatic lesions or intrahepatic biliary dilatation. Gallbladder is unremarkable. No common bile duct dilatation. Pancreas: No mass, inflammation or ductal dilatation. Spleen: Normal size.  No focal lesions. Adrenals/Urinary Tract: Adrenal glands are unremarkable. Small scattered renal cysts but no worrisome renal lesions or hydronephrosis. The bladder is unremarkable. Stomach/Bowel: The stomach, duodenum, small bowel and colon are unremarkable. No acute inflammatory changes, mass lesions or obstructive findings. Stable surgical changes involving the sigmoid colon. The terminal ileum and appendix are normal. Vascular/Lymphatic: Stable age advanced atherosclerotic calcifications involving the abdominal aorta and iliac arteries. No aneurysm. The major venous structures are patent. Small scattered mesenteric and retroperitoneal lymph nodes but no mass or adenopathy. Reproductive: Surgically absent. Other: No pelvic mass or adenopathy. No free pelvic fluid collections. No inguinal mass or adenopathy. No abdominal wall hernia or subcutaneous lesions. Postoperative changes related to prior midline abdominal surgery with fairly extensive scar tissue but no abdominal wall hernia. Musculoskeletal: No significant bony findings. IMPRESSION: 1. No acute abdominal/pelvic findings, mass lesions or adenopathy. 2. Stable age advanced atherosclerotic calcifications involving the abdominal aorta and iliac arteries. 3. Stable surgical changes involving the sigmoid colon. 4. Aortic atherosclerosis. Aortic Atherosclerosis (ICD10-I70.0). Electronically Signed   By: Marijo Sanes  M.D.   On: 07/09/2020 12:54    Assessment / Plan:   Assessment: 1. Melena, acute on chronic anemia: Small bowel enteroscopy 07/08/2020 with cautery of a  single small bowel AVM, no further melena, hemoglobin stable 2. Abdominal pain: Patient started with some abdominal pain yesterday which was thought due to insufflation during enteroscopy and possibly related to incarcerated abdominal wall hernia, CT yesterday unrevealing as above, surgery saw the patient and she was no longer in pain, doing well this morning  Plan: 1. Increase diet as tolerated 2. Okay with patient discharge home today. 3. Patient should likely follow-up with her PCP for repeat CBC in a week or so  Thank you for your kind consultation. We will sign off.   LOS: 2 days   Levin Erp  07/10/2020, 9:14 AM

## 2020-07-11 ENCOUNTER — Inpatient Hospital Stay: Payer: Medicare Other

## 2020-07-11 ENCOUNTER — Encounter (HOSPITAL_COMMUNITY): Payer: Self-pay | Admitting: Gastroenterology

## 2020-07-12 ENCOUNTER — Telehealth: Payer: Self-pay

## 2020-07-12 ENCOUNTER — Other Ambulatory Visit: Payer: Self-pay | Admitting: Internal Medicine

## 2020-07-12 DIAGNOSIS — M109 Gout, unspecified: Secondary | ICD-10-CM

## 2020-07-12 DIAGNOSIS — S78112A Complete traumatic amputation at level between left hip and knee, initial encounter: Secondary | ICD-10-CM

## 2020-07-12 NOTE — Telephone Encounter (Signed)
Hospital TOC per patient, appt 07/18/2020.

## 2020-07-12 NOTE — Telephone Encounter (Signed)
Patient calls back desiring to keep her lab appointment on 2/22 at 10:45 requesting it not be moved.  I have sent Dr. Ernestina Penna scheduler a message.

## 2020-07-12 NOTE — Anesthesia Postprocedure Evaluation (Signed)
Anesthesia Post Note  Patient: [name]  Procedure(s) Performed: ENTEROSCOPY (N/A ) SUBMUCOSAL INJECTION HOT HEMOSTASIS (ARGON PLASMA COAGULATION/BICAP) (N/A )     Patient location during evaluation: Endoscopy Anesthesia Type: MAC Level of consciousness: awake Pain management: pain level controlled Vital Signs Assessment: post-procedure vital signs reviewed and stable Respiratory status: spontaneous breathing Cardiovascular status: stable Postop Assessment: no apparent nausea or vomiting Anesthetic complications: no   No complications documented.  Last Vitals:  Vitals:   07/09/20 2126 07/10/20 0515  BP: 139/88 133/79  Pulse: 85 90  Resp: 18 16  Temp: 36.4 C 36.8 C  SpO2: 99% 99%    Last Pain:  Vitals:   07/10/20 0845  TempSrc:   PainSc: 0-No pain   Pain Goal: Patients Stated Pain Goal: 2 (07/09/20 0429)                 Huston Foley

## 2020-07-12 NOTE — Telephone Encounter (Signed)
Patient has colchicine at home

## 2020-07-13 ENCOUNTER — Other Ambulatory Visit: Payer: Self-pay

## 2020-07-13 MED ORDER — CETIRIZINE HCL 10 MG PO TABS
10.0000 mg | ORAL_TABLET | Freq: Every day | ORAL | 0 refills | Status: DC
Start: 2020-07-13 — End: 2020-08-09

## 2020-07-14 ENCOUNTER — Other Ambulatory Visit: Payer: Self-pay | Admitting: Internal Medicine

## 2020-07-18 ENCOUNTER — Encounter: Payer: Medicare Other | Admitting: Internal Medicine

## 2020-07-18 ENCOUNTER — Other Ambulatory Visit: Payer: Medicare Other

## 2020-07-19 ENCOUNTER — Encounter: Payer: Self-pay | Admitting: Hematology

## 2020-07-19 ENCOUNTER — Inpatient Hospital Stay: Payer: Medicare Other

## 2020-07-19 ENCOUNTER — Other Ambulatory Visit: Payer: Self-pay

## 2020-07-19 DIAGNOSIS — K219 Gastro-esophageal reflux disease without esophagitis: Secondary | ICD-10-CM | POA: Diagnosis not present

## 2020-07-19 DIAGNOSIS — I10 Essential (primary) hypertension: Secondary | ICD-10-CM | POA: Diagnosis not present

## 2020-07-19 DIAGNOSIS — Z7982 Long term (current) use of aspirin: Secondary | ICD-10-CM | POA: Diagnosis not present

## 2020-07-19 DIAGNOSIS — R600 Localized edema: Secondary | ICD-10-CM | POA: Diagnosis not present

## 2020-07-19 DIAGNOSIS — E1136 Type 2 diabetes mellitus with diabetic cataract: Secondary | ICD-10-CM | POA: Diagnosis not present

## 2020-07-19 DIAGNOSIS — K921 Melena: Secondary | ICD-10-CM | POA: Diagnosis not present

## 2020-07-19 DIAGNOSIS — D500 Iron deficiency anemia secondary to blood loss (chronic): Secondary | ICD-10-CM | POA: Diagnosis present

## 2020-07-19 DIAGNOSIS — R5383 Other fatigue: Secondary | ICD-10-CM | POA: Diagnosis not present

## 2020-07-19 DIAGNOSIS — K5520 Angiodysplasia of colon without hemorrhage: Secondary | ICD-10-CM | POA: Diagnosis not present

## 2020-07-19 DIAGNOSIS — Z79899 Other long term (current) drug therapy: Secondary | ICD-10-CM | POA: Diagnosis not present

## 2020-07-19 DIAGNOSIS — E1151 Type 2 diabetes mellitus with diabetic peripheral angiopathy without gangrene: Secondary | ICD-10-CM | POA: Diagnosis not present

## 2020-07-19 DIAGNOSIS — F17210 Nicotine dependence, cigarettes, uncomplicated: Secondary | ICD-10-CM | POA: Diagnosis not present

## 2020-07-19 DIAGNOSIS — Z86010 Personal history of colonic polyps: Secondary | ICD-10-CM | POA: Diagnosis not present

## 2020-07-19 DIAGNOSIS — B182 Chronic viral hepatitis C: Secondary | ICD-10-CM | POA: Diagnosis not present

## 2020-07-19 LAB — CBC WITH DIFFERENTIAL (CANCER CENTER ONLY)
Abs Immature Granulocytes: 0.12 10*3/uL — ABNORMAL HIGH (ref 0.00–0.07)
Basophils Absolute: 0.1 10*3/uL (ref 0.0–0.1)
Basophils Relative: 1 %
Eosinophils Absolute: 0.1 10*3/uL (ref 0.0–0.5)
Eosinophils Relative: 2 %
HCT: 28.9 % — ABNORMAL LOW (ref 36.0–46.0)
Hemoglobin: 9.1 g/dL — ABNORMAL LOW (ref 12.0–15.0)
Immature Granulocytes: 2 %
Lymphocytes Relative: 17 %
Lymphs Abs: 0.9 10*3/uL (ref 0.7–4.0)
MCH: 32.5 pg (ref 26.0–34.0)
MCHC: 31.5 g/dL (ref 30.0–36.0)
MCV: 103.2 fL — ABNORMAL HIGH (ref 80.0–100.0)
Monocytes Absolute: 0.8 10*3/uL (ref 0.1–1.0)
Monocytes Relative: 15 %
Neutro Abs: 3.3 10*3/uL (ref 1.7–7.7)
Neutrophils Relative %: 63 %
Platelet Count: 267 10*3/uL (ref 150–400)
RBC: 2.8 MIL/uL — ABNORMAL LOW (ref 3.87–5.11)
RDW: 15.4 % (ref 11.5–15.5)
WBC Count: 5.2 10*3/uL (ref 4.0–10.5)
nRBC: 0 % (ref 0.0–0.2)

## 2020-07-19 LAB — FERRITIN: Ferritin: 147 ng/mL (ref 11–307)

## 2020-07-19 LAB — SAMPLE TO BLOOD BANK

## 2020-07-21 ENCOUNTER — Other Ambulatory Visit: Payer: Self-pay | Admitting: Internal Medicine

## 2020-07-21 ENCOUNTER — Ambulatory Visit (INDEPENDENT_AMBULATORY_CARE_PROVIDER_SITE_OTHER): Payer: Medicare Other | Admitting: Internal Medicine

## 2020-07-21 ENCOUNTER — Ambulatory Visit: Payer: Medicare Other | Admitting: Behavioral Health

## 2020-07-21 ENCOUNTER — Other Ambulatory Visit: Payer: Self-pay

## 2020-07-21 ENCOUNTER — Encounter: Payer: Self-pay | Admitting: Internal Medicine

## 2020-07-21 VITALS — BP 113/68 | HR 87 | Temp 98.4°F | Ht 62.0 in | Wt 165.1 lb

## 2020-07-21 DIAGNOSIS — F4321 Adjustment disorder with depressed mood: Secondary | ICD-10-CM | POA: Insufficient documentation

## 2020-07-21 DIAGNOSIS — I10 Essential (primary) hypertension: Secondary | ICD-10-CM | POA: Diagnosis not present

## 2020-07-21 DIAGNOSIS — Z794 Long term (current) use of insulin: Secondary | ICD-10-CM | POA: Diagnosis not present

## 2020-07-21 DIAGNOSIS — E119 Type 2 diabetes mellitus without complications: Secondary | ICD-10-CM

## 2020-07-21 MED ORDER — LOSARTAN POTASSIUM 100 MG PO TABS: 100.0000 mg | ORAL_TABLET | Freq: Every day | ORAL | 3 refills | Status: DC

## 2020-07-21 MED ORDER — AMLODIPINE BESYLATE 10 MG PO TABS: 10.0000 mg | ORAL_TABLET | Freq: Every day | ORAL | 3 refills | Status: DC

## 2020-07-21 MED ORDER — TRULICITY 0.75 MG/0.5ML ~~LOC~~ SOAJ: 0.7500 mg | SUBCUTANEOUS | 3 refills | Status: DC

## 2020-07-21 MED ORDER — DIAZEPAM 5 MG PO TABS: 5.0000 mg | ORAL_TABLET | Freq: Every evening | ORAL | 0 refills | Status: DC | PRN

## 2020-07-21 MED FILL — diazePAM 5 MG TABS: 5 | 4 days supply | Qty: 4 | Fill #0
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
## 2020-07-21 NOTE — Progress Notes (Signed)
   CC: Grief  HPI:  [name] is a 70 y.o. with a PMHx as listed below who presents to the clinic for Grief.   Please see the Encounters tab for problem-based Assessment & Plan regarding status of patient's acute and chronic conditions.  Past Medical History:  Diagnosis Date  . Allergic rhinitis   . Anemia, iron deficiency 05/03/2011   2/2 GI AVMs - recieves iron infusions as well as periodic red blood cell transfusions  . Anxiety   . Arteriovenous malformation of duodenum 02/25/2015  . CARPAL TUNNEL RELEASE, HX OF 07/22/2008  . Chest pain 10/28/2019  . Colon polyps   . Diabetes mellitus without complication ([place])    type 2  . Difficulty sleeping    takes trazadone for sleep  . Diverticulosis   . Fall 01/03/2020  . GERD (gastroesophageal reflux disease)   . Hepatitis C    in SVR with Harvoni 12/2014-02/2015.   Marland Kitchen History of hernia repair 08/18/2012  . History of porphyria 11/30/2014   Porphyria cutanea tarda in setting of chronic Hepatitis C, frequent  IV iron infusions, and alcohol abuse   . Hypertension   . Hypocalcemia 10/28/2019  . Hypokalemia 09/27/2014  . Peripheral vascular disease ([place])   . Stroke [facility]) 2010   no residual deficits  . Substance use disorder    cocaine  . Tachycardia 07/10/2017  . Trichomonas vaginitis 04/16/2019   Review of Systems: Review of Systems  Psychiatric/Behavioral: Negative for hallucinations, substance abuse and suicidal ideas. The patient is nervous/anxious and has insomnia.        + Grief   Physical Exam:  Vitals:   07/21/20 1358  BP: 113/68  Pulse: 87  Temp: 98.4 F (36.9 C)  TempSrc: Oral  SpO2: 100%  Weight: 165 lb 1.6 oz (74.9 kg)  Height: 5\' 2"  (1.575 m)   Physical Exam Vitals and nursing note reviewed.  Constitutional:      General: She is not in acute distress.    Appearance: She is normal weight.  Neurological:     Mental Status: She is alert.  Psychiatric:        Attention and Perception: Attention normal.         Mood and Affect: Affect is tearful.        Speech: Speech normal.        Behavior: Behavior normal. Behavior is cooperative.        Thought Content: Thought content normal.        Cognition and Memory: Cognition and memory normal.        Judgment: Judgment normal.    Assessment & Plan:   See Encounters Tab for problem based charting.  Patient discussed with Dr. Daryll Drown

## 2020-07-21 NOTE — Telephone Encounter (Signed)
Pt is being seen today by Dr Charleen Kirks.

## 2020-07-21 NOTE — Patient Instructions (Addendum)
It was nice seeing you today! Thank you for choosing [facility] for your Primary Care.     It is very important to get a good night's rest to help your head feel better. I have sent in a prescription for Valium. Take 1 tablet before bedtime. Be careful if you need to get up after you take it, as you will be drowsy.   Please make sure not to take more than 8 tablets of Tylenol per day. That is 2 tablets every 6 hours maximum. If your head continues to hurt even after sleeping through the night, call and let us know.   Please let us know if there is anything we can assist with during this difficult time.

## 2020-07-22 NOTE — Progress Notes (Signed)
[place]   Telephone:([phone]) [phone] Fax:(336) [phone]   Clinic Follow up Note   Patient Care Team: Jose Persia, MD as PCP - Evalina Field, MD as Consulting Physician (Ophthalmology) Coralie Keens, MD as Consulting Physician (General Surgery) Truitt Merle, MD as Consulting Physician (Hematology) Comer, Okey Regal, MD as Consulting Physician (Infectious Diseases)  Date of Service:  07/25/2020  CHIEF COMPLAINT: Anemia    CURRENT THERAPY:  IV iron and blood transfusions as needed  INTERVAL HISTORY:  [name] is here for a follow up. She presents to the clinic alone. She had endoscopy on 07/08/20 with Dr Ardis Hughs. She has not had another episode of black or bloody stool. Her BM are good. She notes she was started on $RemoveBe'5mg'jFiWJKAXd$  Valium to help her sleep by her PCP. This is helping her.    REVIEW OF SYSTEMS:   Constitutional: Denies fevers, chills or abnormal weight loss Eyes: Denies blurriness of vision Ears, nose, mouth, throat, and face: Denies mucositis or sore throat Respiratory: Denies cough, dyspnea or wheezes Cardiovascular: Denies palpitation, chest discomfort or lower extremity swelling Gastrointestinal:  Denies nausea, heartburn or change in bowel habits Skin: Denies abnormal skin rashes Lymphatics: Denies new lymphadenopathy or easy bruising Neurological:Denies numbness, tingling or new weaknesses Behavioral/Psych: Mood is stable, no new changes  All other systems were reviewed with the patient and are negative.  MEDICAL HISTORY:  Past Medical History:  Diagnosis Date  . Allergic rhinitis   . Anemia, iron deficiency 05/03/2011   2/2 GI AVMs - recieves iron infusions as well as periodic red blood cell transfusions  . Anxiety   . Arteriovenous malformation of duodenum 02/25/2015  . CARPAL TUNNEL RELEASE, HX OF 07/22/2008  . Chest pain 10/28/2019  . Colon polyps   . Diabetes mellitus without complication ([place])    type 2  . Difficulty sleeping     takes trazadone for sleep  . Diverticulosis   . Fall 01/03/2020  . GERD (gastroesophageal reflux disease)   . Hepatitis C    in SVR with Harvoni 12/2014-02/2015.   Marland Kitchen History of hernia repair 08/18/2012  . History of porphyria 11/30/2014   Porphyria cutanea tarda in setting of chronic Hepatitis C, frequent  IV iron infusions, and alcohol abuse   . Hypertension   . Hypocalcemia 10/28/2019  . Hypokalemia 09/27/2014  . Peripheral vascular disease ([place])   . Stroke [facility]) 2010   no residual deficits  . Substance use disorder    cocaine  . Tachycardia 07/10/2017  . Trichomonas vaginitis 04/16/2019    SURGICAL HISTORY: Past Surgical History:  Procedure Laterality Date  . ABDOMINAL HYSTERECTOMY    . APPLICATION OF WOUND VAC N/A 06/21/2014   Procedure: APPLICATION OF WOUND VAC;  Surgeon: Coralie Keens, MD;  Location: [place];  Service: General;  Laterality: N/A;  . CARPAL TUNNEL RELEASE     rt hand  . COLONOSCOPY WITH PROPOFOL N/A 03/17/2018   Procedure: COLONOSCOPY WITH PROPOFOL;  Surgeon: Doran Stabler, MD;  Location: [facility];  Service: Gastroenterology;  Laterality: N/A;  . ENTEROSCOPY N/A 12/04/2012   Procedure: ENTEROSCOPY;  Surgeon: Beryle Beams, MD;  [facility];  Service: Endoscopy;  Laterality: N/A;  . ENTEROSCOPY N/A 12/18/2012   Procedure: ENTEROSCOPY;  Surgeon: Beryle Beams, MD;  [facility];  Service: Endoscopy;  Laterality: N/A;  . ENTEROSCOPY N/A 02/25/2015   Procedure: ENTEROSCOPY;  Surgeon: Inda Castle, MD;  Location: [facility] ENDOSCOPY;  Service: Endoscopy;  Laterality: N/A;  .  ENTEROSCOPY N/A 10/29/2017   Procedure: ENTEROSCOPY;  Surgeon: Doran Stabler, MD;  Location[facility];  Service: Gastroenterology;  Laterality: N/A;  . ENTEROSCOPY N/A 07/08/2020   Procedure: ENTEROSCOPY;  Surgeon: Milus Banister, MD;  [facility];  Service: Endoscopy;  Laterality: N/A;  . ESOPHAGOGASTRODUODENOSCOPY  05/05/2011   Procedure:  ESOPHAGOGASTRODUODENOSCOPY (EGD);  Surgeon: Zenovia Jarred, MD;  Location: [facility];  Service: Gastroenterology;  Laterality: N/A;  Dr. Hilarie Fredrickson will do procedure for Dr. Benson Norway Saturday.  . ESOPHAGOGASTRODUODENOSCOPY  05/08/2011   Procedure: ESOPHAGOGASTRODUODENOSCOPY (EGD);  Surgeon: Beryle Beams;  [facility];  Service: Endoscopy;  Laterality: N/A;  . ESOPHAGOGASTRODUODENOSCOPY  06/07/2011   Procedure: ESOPHAGOGASTRODUODENOSCOPY (EGD);  Surgeon: Beryle Beams, MD;  Location: [facility];  Service: Endoscopy;  Laterality: N/A;  . ESOPHAGOGASTRODUODENOSCOPY  12/20/2011   Procedure: ESOPHAGOGASTRODUODENOSCOPY (EGD);  Surgeon: Beryle Beams, MD;  Location: [facility];  Service: Endoscopy;  Laterality: N/A;  . EYE SURGERY Bilateral    cataracts  . FLEXIBLE SIGMOIDOSCOPY  12/21/2011   Procedure: FLEXIBLE SIGMOIDOSCOPY;  Surgeon: Beryle Beams, MD;  [facility];  Service: Endoscopy;  Laterality: N/A;  . HOT HEMOSTASIS  06/07/2011   Procedure: HOT HEMOSTASIS (ARGON PLASMA COAGULATION/BICAP);  Surgeon: Beryle Beams, MD;  [facility];  Service: Endoscopy;  Laterality: N/A;  . HOT HEMOSTASIS N/A 07/08/2020   Procedure: HOT HEMOSTASIS (ARGON PLASMA COAGULATION/BICAP);  Surgeon: Milus Banister, MD;  Location[facility];  Service: Endoscopy;  Laterality: N/A;  . INCISIONAL HERNIA REPAIR N/A 06/01/2014   Procedure: ATTEMPTED LAPAROSCOPIC AND OPEN INCISIONAL HERNIA REPAIR WITH MESH;  Surgeon: Coralie Keens, MD;  Location: [place];  Service: General;  Laterality: N/A;  . INSERTION OF MESH N/A 06/01/2014   Procedure: INSERTION OF MESH;  Surgeon: Coralie Keens, MD;  Location: [place];  Service: General;  Laterality: N/A;  . LAPAROTOMY  02/16/2012   Procedure: EXPLORATORY LAPAROTOMY;  Surgeon: Edward Jolly, MD;  [facility];  Service: General;  Laterality: N/A;  oversewing of anastomotic leak and rigid sigmoidoscopy  . LAPAROTOMY N/A 06/21/2014   Procedure:  ABDOMINAL WOUND EXPLORATION;  Surgeon: Coralie Keens, MD;  Location: [place];  Service: General;  Laterality: N/A;  . LEG AMPUTATION ABOVE KNEE     left  . PARTIAL COLECTOMY  02/15/2012   Procedure: PARTIAL COLECTOMY;  Surgeon: Harl Bowie, MD;  [facility];  Service: General;  Laterality: N/A;  . SUBMUCOSAL INJECTION  07/08/2020   Procedure: SUBMUCOSAL INJECTION;  Surgeon: Milus Banister, MD;  [facility];  Service: Endoscopy;;  . TONSILLECTOMY      I have reviewed the social history and family history with the patient and they are unchanged from previous note.  ALLERGIES:  is allergic to ciprofloxacin, morphine and related, penicillins, codeine, lisinopril, and feraheme [ferumoxytol].  MEDICATIONS:  Current Outpatient Medications  Medication Sig Dispense Refill  . ACCU-CHEK FASTCLIX LANCETS MISC Use to test blood glucose 3 times daily. 100 each 11  . acetaminophen (TYLENOL) 500 MG tablet Take 1,000 mg by mouth every 6 (six) hours as needed.    Marland Kitchen allopurinol (ZYLOPRIM) 100 MG tablet TAKE 1 TABLET (100 MG TOTAL) BY MOUTH DAILY. 30 tablet 5  . amLODipine (NORVASC) 10 MG tablet Take 1 tablet (10 mg total) by mouth daily. 90 tablet 3  . aspirin EC 81 MG tablet Take 1 tablet (81 mg total) by mouth daily. 30 tablet 1  . Blood Glucose Monitoring Suppl (ACCU-CHEK GUIDE) w/Device  KIT 1 each by Does not apply route 3 (three) times daily. The patient is insulin requiring, ICD 10 code E11.65. The patient tests 3 times a day. 1 kit 1  . cetirizine (ZYRTEC) 10 MG tablet Take 1 tablet (10 mg total) by mouth daily. 30 tablet 0  . cholecalciferol (VITAMIN D) 1000 units tablet Take 1 tablet (1,000 Units total) by mouth daily. 100 tablet 2  . colchicine 0.6 MG tablet TAKE 1 TABLET (0.6 MG TOTAL) BY MOUTH DAILY. (Patient taking differently: Take 0.6 mg by mouth as needed (gout).) 30 tablet 1  . diazepam (VALIUM) 5 MG tablet Take 1 tablet (5 mg total) by mouth at bedtime as needed for  anxiety. 4 tablet 0  . Dulaglutide (TRULICITY) 8.65 HQ/4.6NG SOPN Inject 0.75 mg into the skin once a week. 6 mL 3  . feeding supplement, GLUCERNA SHAKE, (GLUCERNA SHAKE) LIQD Take 237 mLs by mouth 3 (three) times daily between meals. (Patient taking differently: Take 237 mLs by mouth 2 (two) times daily between meals.) 90 Can 0  . gabapentin (NEURONTIN) 400 MG capsule Take 1-2 capsules (400-800 mg total) by mouth 3 (three) times daily. 540 capsule 0  . glucose blood (ACCU-CHEK GUIDE) test strip USE TO TEST BLOOD SUGAR THREE TIMES A DAY 100 strip 3  . losartan (COZAAR) 100 MG tablet Take 1 tablet (100 mg total) by mouth daily. 90 tablet 3  . metFORMIN (GLUCOPHAGE) 1000 MG tablet TAKE 1 TABLET BY MOUTH TWICE A DAY WITH A MEAL (Patient taking differently: Take 1,000 mg by mouth daily.) 90 tablet 6  . omeprazole (PRILOSEC) 20 MG capsule Take 1 capsule (20 mg total) by mouth daily. 90 capsule 1   No current facility-administered medications for this visit.    PHYSICAL EXAMINATION: ECOG PERFORMANCE STATUS: 3 - Symptomatic, >50% confined to bed  Vitals:   07/25/20 0952  BP: 118/64  Pulse: 85  Resp: 15  Temp: 97.6 F (36.4 C)  SpO2: 100%   Filed Weights   07/25/20 0952  Weight: 165 lb (74.8 kg)    Due to COVID19 we will limit examination to appearance. Patient had no complaints.  GENERAL:alert, no distress and comfortable SKIN: skin color normal, no rashes or significant lesions EYES: normal, Conjunctiva are pink and non-injected, sclera clear  NEURO: alert & oriented x 3 with fluent speech  LABORATORY DATA:  I have reviewed the data as listed CBC Latest Ref Rng & Units 07/25/2020 07/19/2020 07/10/2020  WBC 4.0 - 10.5 K/uL 5.8 5.2 5.5  Hemoglobin 12.0 - 15.0 g/dL 8.3(L) 9.1(L) 9.3(L)  Hematocrit 36.0 - 46.0 % 26.4(L) 28.9(L) 29.2(L)  Platelets 150 - 400 K/uL 300 267 268     CMP Latest Ref Rng & Units 07/10/2020 07/09/2020 07/08/2020  Glucose 70 - 99 mg/dL 133(H) 121(H) 119(H)  BUN 8  - 23 mg/dL $Remove'17 18 20  'IiLOphQ$ Creatinine 0.44 - 1.00 mg/dL 1.10(H) 1.21(H) 1.40(H)  Sodium 135 - 145 mmol/L 138 135 137  Potassium 3.5 - 5.1 mmol/L 3.5 3.7 3.4(L)  Chloride 98 - 111 mmol/L 103 101 104  CO2 22 - 32 mmol/L $RemoveB'26 25 25  'HIrNMuIl$ Calcium 8.9 - 10.3 mg/dL 9.1 8.8(L) 8.6(L)  Total Protein 6.5 - 8.1 g/dL - - -  Total Bilirubin 0.3 - 1.2 mg/dL - - -  Alkaline Phos 38 - 126 U/L - - -  AST 15 - 41 U/L - - -  ALT 0 - 44 U/L - - -      RADIOGRAPHIC STUDIES: I  have personally reviewed the radiological images as listed and agreed with the findings in the report. No results found.   ASSESSMENT & PLAN:  [name] is a 70 y.o. female with    1. Iron deficiency anemia secondary to GIbloodloss,andAnemia of Chronic Disease  -History of multiple GI bleeding in 2016, required blood transfusion, hospitalization and IV Feraheme -She had an EGD and colonoscopy on 10/29/2017 and 03/17/2018 with Dr. Loletha Carrow, which was normal.  -She endoscopy in [facility] and was found to have AVM in small bowel.  -She has been on oral iron for 5-6 years now and has hadintermittentblack watery stool.  -She has been receiving IV Feraheme every1-57month on average lately.Goal is to keep her Hg >10and ferritin>100.Her last IV Feraheme wason 06/29/2020 -After recent melena, she required hospitalization and blood transfusion 2 weeks ago. Dr Ardis Hughs did upper endoscopy on 07/09/19 and was seen to have non-bleeding AVM. This was cauterized.  -She denies any more melena, but Hg has dropped to 8.3 today (07/25/20). I will give IV Feraheme this week. She is agreeable.  -I discussed the option of Bevacizumab for AVM related GI bleeding and anemia if she continues to require multiple blood transfusions. I discussed this is off label use, side effects reviewed, she is interested, will start if she has persistent anemia and requiring blood transfusion   2. Porphyria cutaneoustarda (PCT), resolvedafter Hep C  Treatment  3.Chronic hepatitis C infection -She tested positive for hepatitis C in 2009. She has previously completed treatment for hepatitis C in 2016 and repeated test was negative.  -She had an EGD and colonoscopy on 10/29/2017 and 03/17/2018 with Dr. Loletha Carrow, which was normal. Dorothyann Gibbs continue to follow up with him. -05/2018 liver US was unremarkable. -She gets 2 flu shots a year (March and October) and she received her COVID booster in 02/2020.  4. DM2, Trouble sleeping  -Management per PCP's office. -Her PCP started her on Valium $RemoveB'5mg'WVSMIkCE$  as needed to help her sleep. I recommend she not use this every night as it can lead to dependence.   5. LeftAKA, hasprosthesis, she is wheelchair bound most of time, Right LE Edema  -LE edemacould be related to nutritional deficiencies or decreased venous return. I encouraged her to elevateher right leg.  -On 12/30/19 she had a mechanical fall. For pain she has been on Oxycodone q6hours. Her left shoulder pain has improved but her left stump pain mostly stable. -She notes she has been using crutches and cane more lately and is working towards walking independently.She does not use power chair when she uses [employer].  Plan -IV Feraheme once this week and one more dose in 2 weeks  -Lab weekly X2  -F/u in 2 weeks.    No problem-specific Assessment & Plan notes found for this encounter.   No orders of the defined types were placed in this encounter.  All questions were answered. The patient knows to call the clinic with any problems, questions or concerns. No barriers to learning was detected. The total time spent in the appointment was 30 minutes.     Truitt Merle, MD 07/25/2020   I, Joslyn Devon, am acting as scribe for Truitt Merle, MD.   I have reviewed the above documentation for accuracy and completeness, and I agree with the above.

## 2020-07-25 ENCOUNTER — Inpatient Hospital Stay (HOSPITAL_BASED_OUTPATIENT_CLINIC_OR_DEPARTMENT_OTHER): Payer: Medicare Other | Admitting: Hematology

## 2020-07-25 ENCOUNTER — Telehealth: Payer: Self-pay | Admitting: Hematology

## 2020-07-25 ENCOUNTER — Telehealth: Payer: Self-pay | Admitting: Internal Medicine

## 2020-07-25 ENCOUNTER — Other Ambulatory Visit: Payer: Self-pay

## 2020-07-25 ENCOUNTER — Encounter: Payer: Self-pay | Admitting: Hematology

## 2020-07-25 ENCOUNTER — Inpatient Hospital Stay: Payer: Medicare Other

## 2020-07-25 VITALS — BP 118/64 | HR 85 | Temp 97.6°F | Resp 15 | Ht 62.0 in | Wt 165.0 lb

## 2020-07-25 DIAGNOSIS — Z8619 Personal history of other infectious and parasitic diseases: Secondary | ICD-10-CM | POA: Diagnosis not present

## 2020-07-25 DIAGNOSIS — D500 Iron deficiency anemia secondary to blood loss (chronic): Secondary | ICD-10-CM

## 2020-07-25 LAB — CBC WITH DIFFERENTIAL (CANCER CENTER ONLY)
Abs Immature Granulocytes: 0.05 10*3/uL (ref 0.00–0.07)
Basophils Absolute: 0 10*3/uL (ref 0.0–0.1)
Basophils Relative: 1 %
Eosinophils Absolute: 0.1 10*3/uL (ref 0.0–0.5)
Eosinophils Relative: 2 %
HCT: 26.4 % — ABNORMAL LOW (ref 36.0–46.0)
Hemoglobin: 8.3 g/dL — ABNORMAL LOW (ref 12.0–15.0)
Immature Granulocytes: 1 %
Lymphocytes Relative: 17 %
Lymphs Abs: 1 10*3/uL (ref 0.7–4.0)
MCH: 32.7 pg (ref 26.0–34.0)
MCHC: 31.4 g/dL (ref 30.0–36.0)
MCV: 103.9 fL — ABNORMAL HIGH (ref 80.0–100.0)
Monocytes Absolute: 0.5 10*3/uL (ref 0.1–1.0)
Monocytes Relative: 8 %
Neutro Abs: 4.2 10*3/uL (ref 1.7–7.7)
Neutrophils Relative %: 71 %
Platelet Count: 300 10*3/uL (ref 150–400)
RBC: 2.54 MIL/uL — ABNORMAL LOW (ref 3.87–5.11)
RDW: 14.5 % (ref 11.5–15.5)
WBC Count: 5.8 10*3/uL (ref 4.0–10.5)
nRBC: 0 % (ref 0.0–0.2)

## 2020-07-25 LAB — IRON AND TIBC
Iron: 29 ug/dL — ABNORMAL LOW (ref 41–142)
Saturation Ratios: 8 % — ABNORMAL LOW (ref 21–57)
TIBC: 355 ug/dL (ref 236–444)
UIBC: 326 ug/dL (ref 120–384)

## 2020-07-25 LAB — FERRITIN: Ferritin: 66 ng/mL (ref 11–307)

## 2020-07-25 LAB — SAMPLE TO BLOOD BANK

## 2020-07-25 NOTE — Telephone Encounter (Signed)
Attempted to call patient this afternoon to see how she is doing since her appointment last week.  Unable to leave voice message as phone kept ringing.

## 2020-07-25 NOTE — Telephone Encounter (Signed)
Scheduled appointments per 2/28 los. Spoke to patient who is aware of appointments date and times. Gave patient calendar print out.  °

## 2020-07-27 ENCOUNTER — Telehealth: Payer: Self-pay | Admitting: Hematology

## 2020-07-27 ENCOUNTER — Inpatient Hospital Stay: Payer: Medicare Other | Attending: Hematology

## 2020-07-27 ENCOUNTER — Other Ambulatory Visit: Payer: Self-pay

## 2020-07-27 VITALS — BP 132/67 | HR 85 | Temp 98.5°F | Resp 16

## 2020-07-27 DIAGNOSIS — Z86010 Personal history of colonic polyps: Secondary | ICD-10-CM | POA: Diagnosis not present

## 2020-07-27 DIAGNOSIS — B1920 Unspecified viral hepatitis C without hepatic coma: Secondary | ICD-10-CM | POA: Insufficient documentation

## 2020-07-27 DIAGNOSIS — Z89612 Acquired absence of left leg above knee: Secondary | ICD-10-CM | POA: Insufficient documentation

## 2020-07-27 DIAGNOSIS — R609 Edema, unspecified: Secondary | ICD-10-CM | POA: Diagnosis not present

## 2020-07-27 DIAGNOSIS — D500 Iron deficiency anemia secondary to blood loss (chronic): Secondary | ICD-10-CM

## 2020-07-27 DIAGNOSIS — K922 Gastrointestinal hemorrhage, unspecified: Secondary | ICD-10-CM | POA: Diagnosis present

## 2020-07-27 DIAGNOSIS — E119 Type 2 diabetes mellitus without complications: Secondary | ICD-10-CM | POA: Insufficient documentation

## 2020-07-27 DIAGNOSIS — D509 Iron deficiency anemia, unspecified: Secondary | ICD-10-CM | POA: Diagnosis present

## 2020-07-27 DIAGNOSIS — Z79899 Other long term (current) drug therapy: Secondary | ICD-10-CM | POA: Diagnosis not present

## 2020-07-27 DIAGNOSIS — E876 Hypokalemia: Secondary | ICD-10-CM | POA: Diagnosis not present

## 2020-07-27 DIAGNOSIS — I10 Essential (primary) hypertension: Secondary | ICD-10-CM | POA: Diagnosis not present

## 2020-07-27 DIAGNOSIS — R000 Tachycardia, unspecified: Secondary | ICD-10-CM | POA: Insufficient documentation

## 2020-07-27 DIAGNOSIS — K219 Gastro-esophageal reflux disease without esophagitis: Secondary | ICD-10-CM | POA: Diagnosis not present

## 2020-07-27 DIAGNOSIS — I739 Peripheral vascular disease, unspecified: Secondary | ICD-10-CM | POA: Insufficient documentation

## 2020-07-27 MED ORDER — SODIUM CHLORIDE 0.9 % IV SOLN
Freq: Once | INTRAVENOUS | Status: AC
  Filled 2020-07-27: qty 250

## 2020-07-27 MED ORDER — DIPHENHYDRAMINE HCL 50 MG/ML IJ SOLN
50.0000 mg | Freq: Once | INTRAMUSCULAR | Status: AC
  Administered 2020-07-27: 50 mg via INTRAVENOUS

## 2020-07-27 MED ORDER — FAMOTIDINE 20 MG PO TABS
ORAL_TABLET | ORAL | Status: AC
  Filled 2020-07-27: qty 1

## 2020-07-27 MED ORDER — DIPHENHYDRAMINE HCL 50 MG/ML IJ SOLN
INTRAMUSCULAR | Status: AC
  Filled 2020-07-27: qty 1

## 2020-07-27 MED ORDER — FAMOTIDINE 20 MG PO TABS
20.0000 mg | ORAL_TABLET | Freq: Once | ORAL | Status: AC
  Administered 2020-07-27: 20 mg via ORAL

## 2020-07-27 MED ORDER — SODIUM CHLORIDE 0.9 % IV SOLN
510.0000 mg | Freq: Once | INTRAVENOUS | Status: AC
  Administered 2020-07-27: 510 mg via INTRAVENOUS
  Filled 2020-07-27: qty 510

## 2020-07-27 NOTE — Progress Notes (Signed)
Patient was observed for 30 minutes post infusion with no complaints. Vitals stable and patient in no distress upon departure of infusion clinic.

## 2020-07-27 NOTE — Patient Instructions (Signed)
Ferumoxytol injection What is this medicine? FERUMOXYTOL is an iron complex. Iron is used to make healthy red blood cells, which carry oxygen and nutrients throughout the body. This medicine is used to treat iron deficiency anemia. This medicine may be used for other purposes; ask your health care provider or pharmacist if you have questions. COMMON BRAND NAME(S): Feraheme What should I tell my health care provider before I take this medicine? They need to know if you have any of these conditions:  anemia not caused by low iron levels  high levels of iron in the blood  magnetic resonance imaging (MRI) test scheduled  an unusual or allergic reaction to iron, other medicines, foods, dyes, or preservatives  pregnant or trying to get pregnant  breast-feeding How should I use this medicine? This medicine is for injection into a vein. It is given by a health care professional in a hospital or clinic setting. Talk to your pediatrician regarding the use of this medicine in children. Special care may be needed. Overdosage: If you think you have taken too much of this medicine contact a poison control center or emergency room at once. NOTE: This medicine is only for you. Do not share this medicine with others. What if I miss a dose? It is important not to miss your dose. Call your doctor or health care professional if you are unable to keep an appointment. What may interact with this medicine? This medicine may interact with the following medications:  other iron products This list may not describe all possible interactions. Give your health care provider a list of all the medicines, herbs, non-prescription drugs, or dietary supplements you use. Also tell them if you smoke, drink alcohol, or use illegal drugs. Some items may interact with your medicine. What should I watch for while using this medicine? Visit your doctor or healthcare professional regularly. Tell your doctor or healthcare  professional if your symptoms do not start to get better or if they get worse. You may need blood work done while you are taking this medicine. You may need to follow a special diet. Talk to your doctor. Foods that contain iron include: whole grains/cereals, dried fruits, beans, or peas, leafy green vegetables, and organ meats (liver, kidney). What side effects may I notice from receiving this medicine? Side effects that you should report to your doctor or health care professional as soon as possible:  allergic reactions like skin rash, itching or hives, swelling of the face, lips, or tongue  breathing problems  changes in blood pressure  feeling faint or lightheaded, falls  fever or chills  flushing, sweating, or hot feelings  swelling of the ankles or feet Side effects that usually do not require medical attention (report to your doctor or health care professional if they continue or are bothersome):  diarrhea  headache  nausea, vomiting  stomach pain This list may not describe all possible side effects. Call your doctor for medical advice about side effects. You may report side effects to FDA at 1-800-FDA-1088. Where should I keep my medicine? This drug is given in a hospital or clinic and will not be stored at home. NOTE: This sheet is a summary. It may not cover all possible information. If you have questions about this medicine, talk to your doctor, pharmacist, or health care provider.  2021 Elsevier/Gold Standard (2016-07-02 20:21:10)  

## 2020-07-27 NOTE — Telephone Encounter (Signed)
Rescheduled 03/14 to 03/16 per patient's request.

## 2020-07-29 NOTE — Progress Notes (Signed)
Internal Medicine Clinic Attending  Case discussed with Dr. Basaraba  At the time of the visit.  We reviewed the resident's history and exam and pertinent patient test results.  I agree with the assessment, diagnosis, and plan of care documented in the resident's note.  

## 2020-08-01 ENCOUNTER — Other Ambulatory Visit: Payer: Self-pay

## 2020-08-01 ENCOUNTER — Telehealth: Payer: Self-pay

## 2020-08-01 ENCOUNTER — Inpatient Hospital Stay: Payer: Medicare Other

## 2020-08-01 DIAGNOSIS — K922 Gastrointestinal hemorrhage, unspecified: Secondary | ICD-10-CM | POA: Diagnosis not present

## 2020-08-01 DIAGNOSIS — D509 Iron deficiency anemia, unspecified: Secondary | ICD-10-CM | POA: Diagnosis not present

## 2020-08-01 DIAGNOSIS — D500 Iron deficiency anemia secondary to blood loss (chronic): Secondary | ICD-10-CM

## 2020-08-01 LAB — CBC WITH DIFFERENTIAL (CANCER CENTER ONLY)
Abs Immature Granulocytes: 0.09 10*3/uL — ABNORMAL HIGH (ref 0.00–0.07)
Basophils Absolute: 0 10*3/uL (ref 0.0–0.1)
Basophils Relative: 1 %
Eosinophils Absolute: 0.1 10*3/uL (ref 0.0–0.5)
Eosinophils Relative: 2 %
HCT: 25.6 % — ABNORMAL LOW (ref 36.0–46.0)
Hemoglobin: 7.9 g/dL — ABNORMAL LOW (ref 12.0–15.0)
Immature Granulocytes: 2 %
Lymphocytes Relative: 18 %
Lymphs Abs: 1 10*3/uL (ref 0.7–4.0)
MCH: 32.5 pg (ref 26.0–34.0)
MCHC: 30.9 g/dL (ref 30.0–36.0)
MCV: 105.3 fL — ABNORMAL HIGH (ref 80.0–100.0)
Monocytes Absolute: 0.7 10*3/uL (ref 0.1–1.0)
Monocytes Relative: 11 %
Neutro Abs: 3.9 10*3/uL (ref 1.7–7.7)
Neutrophils Relative %: 67 %
Platelet Count: 393 10*3/uL (ref 150–400)
RBC: 2.43 MIL/uL — ABNORMAL LOW (ref 3.87–5.11)
RDW: 16.5 % — ABNORMAL HIGH (ref 11.5–15.5)
WBC Count: 5.9 10*3/uL (ref 4.0–10.5)
nRBC: 0 /100 WBC
nRBC: 0.3 % — ABNORMAL HIGH (ref 0.0–0.2)

## 2020-08-01 LAB — SAMPLE TO BLOOD BANK

## 2020-08-01 LAB — PREPARE RBC (CROSSMATCH)

## 2020-08-01 LAB — FERRITIN: Ferritin: 263 ng/mL (ref 11–307)

## 2020-08-01 NOTE — Telephone Encounter (Signed)
I spoke with [name].  Dr Burr Medico reviewed this am HG 7.9.  Per Dr Burr Medico instructions.  Patient scheduled for 1 unit PC and feraheme infusion on 3/9 at 0930 in [facility].  Pt verbalized understanding.

## 2020-08-03 ENCOUNTER — Inpatient Hospital Stay: Payer: Medicare Other

## 2020-08-03 ENCOUNTER — Encounter: Payer: Self-pay | Admitting: Nurse Practitioner

## 2020-08-03 ENCOUNTER — Other Ambulatory Visit: Payer: Self-pay

## 2020-08-03 ENCOUNTER — Inpatient Hospital Stay (HOSPITAL_BASED_OUTPATIENT_CLINIC_OR_DEPARTMENT_OTHER): Payer: Medicare Other | Admitting: Nurse Practitioner

## 2020-08-03 VITALS — BP 136/73 | HR 79 | Temp 98.4°F | Resp 17

## 2020-08-03 DIAGNOSIS — D509 Iron deficiency anemia, unspecified: Secondary | ICD-10-CM | POA: Diagnosis not present

## 2020-08-03 DIAGNOSIS — D500 Iron deficiency anemia secondary to blood loss (chronic): Secondary | ICD-10-CM

## 2020-08-03 DIAGNOSIS — K922 Gastrointestinal hemorrhage, unspecified: Secondary | ICD-10-CM | POA: Diagnosis not present

## 2020-08-03 MED ORDER — SODIUM CHLORIDE 0.9% IV SOLUTION
250.0000 mL | Freq: Once | INTRAVENOUS | Status: AC
  Administered 2020-08-03: 250 mL via INTRAVENOUS
  Filled 2020-08-03: qty 250

## 2020-08-03 MED ORDER — DIPHENHYDRAMINE HCL 50 MG/ML IJ SOLN
50.0000 mg | Freq: Once | INTRAMUSCULAR | Status: AC
  Administered 2020-08-03: 50 mg via INTRAVENOUS

## 2020-08-03 MED ORDER — SODIUM CHLORIDE 0.9 % IV SOLN
510.0000 mg | Freq: Once | INTRAVENOUS | Status: AC
  Administered 2020-08-03: 510 mg via INTRAVENOUS
  Filled 2020-08-03: qty 510

## 2020-08-03 MED ORDER — FAMOTIDINE 20 MG PO TABS
20.0000 mg | ORAL_TABLET | Freq: Once | ORAL | Status: AC
  Administered 2020-08-03: 20 mg via ORAL

## 2020-08-03 MED ORDER — DIPHENHYDRAMINE HCL 50 MG/ML IJ SOLN
INTRAMUSCULAR | Status: AC
  Filled 2020-08-03: qty 1

## 2020-08-03 MED ORDER — SODIUM CHLORIDE 0.9 % IV SOLN
Freq: Once | INTRAVENOUS | Status: AC
  Filled 2020-08-03: qty 250

## 2020-08-03 MED ORDER — FAMOTIDINE 20 MG PO TABS
ORAL_TABLET | ORAL | Status: AC
  Filled 2020-08-03: qty 1

## 2020-08-03 NOTE — Progress Notes (Addendum)
[place]   [phone] [phone]   Clinic Follow up Note   Patient Care Team: Jose Persia, MD as PCP - Evalina Field, MD as Consulting Physician (Ophthalmology) Coralie Keens, MD as Consulting Physician (General Surgery) Truitt Merle, MD as Consulting Physician (Hematology) Comer, Okey Regal, MD as Consulting Physician (Infectious Diseases) 08/03/2020  CHIEF COMPLAINT: IDA, recent GI bleed  CURRENT THERAPY: IV iron and blood as needed  INTERVAL HISTORY: Ms. Derossett resents returns for follow-up as scheduled.  She is currently receiving 1 unit of blood and IV iron in the symptom management clinic.  She denies signs of recurrent GI bleeding such as black or bloody stool.  Denies other bleeding such as epistaxis or hematuria.  She continues her usual once daily baby aspirin, she notes she increased it to 2 tabs daily the week leading up to her hospitalization which may have contributed to her GI bleeding.  Her energy level is normal.  Her hands are numb like they are when she has low iron.  Denies any cough, chest pain, dyspnea.  She has a follow-up with a kidney specialist coming up.   MEDICAL HISTORY:  Past Medical History:  Diagnosis Date  . Allergic rhinitis   . Anemia, iron deficiency 05/03/2011   2/2 GI AVMs - recieves iron infusions as well as periodic red blood cell transfusions  . Anxiety   . Arteriovenous malformation of duodenum 02/25/2015  . CARPAL TUNNEL RELEASE, HX OF 07/22/2008  . Chest pain 10/28/2019  . Colon polyps   . Diabetes mellitus without complication ([place])    type 2  . Difficulty sleeping    takes trazadone for sleep  . Diverticulosis   . Fall 01/03/2020  . GERD (gastroesophageal reflux disease)   . Hepatitis C    in SVR with Harvoni 12/2014-02/2015.   Marland Kitchen History of hernia repair 08/18/2012  . History of porphyria 11/30/2014   Porphyria cutanea tarda in setting of chronic Hepatitis C, frequent  IV iron infusions,  and alcohol abuse   . Hypertension   . Hypocalcemia 10/28/2019  . Hypokalemia 09/27/2014  . Peripheral vascular disease ([place])   . Stroke [facility]) 2010   no residual deficits  . Substance use disorder    cocaine  . Tachycardia 07/10/2017  . Trichomonas vaginitis 04/16/2019    SURGICAL HISTORY: Past Surgical History:  Procedure Laterality Date  . ABDOMINAL HYSTERECTOMY    . APPLICATION OF WOUND VAC N/A 06/21/2014   Procedure: APPLICATION OF WOUND VAC;  Surgeon: Coralie Keens, MD;  Location: [place];  Service: General;  Laterality: N/A;  . CARPAL TUNNEL RELEASE     rt hand  . COLONOSCOPY WITH PROPOFOL N/A 03/17/2018   Procedure: COLONOSCOPY WITH PROPOFOL;  Surgeon: Doran Stabler, MD;  Location: [facility];  Service: Gastroenterology;  Laterality: N/A;  . ENTEROSCOPY N/A 12/04/2012   Procedure: ENTEROSCOPY;  Surgeon: Beryle Beams, MD;  Location: [facility];  Service: Endoscopy;  Laterality: N/A;  . ENTEROSCOPY N/A 12/18/2012   Procedure: ENTEROSCOPY;  Surgeon: Beryle Beams, MD;  Location: [facility];  Service: Endoscopy;  Laterality: N/A;  . ENTEROSCOPY N/A 02/25/2015   Procedure: ENTEROSCOPY;  Surgeon: Inda Castle, MD;  Location: [facility] END[facility]COPY;  Service: Endoscopy;  Laterality: N/A;  . ENTEROSCOPY N/A 10/29/2017   Procedure: ENTEROSCOPY;  Surgeon: Doran Stabler, MD;  [facility];  Service: Gastroenterology;  Laterality: N/A;  . ENTEROSCOPY N/A 07/08/2020   Procedure: ENTEROSCOPY;  Surgeon: Owens Loffler  P, MD;  Location: [facility];  Service: Endoscopy;  Laterality: N/A;  . ESOPHAGOGASTRODUODENOSCOPY  05/05/2011   Procedure: ESOPHAGOGASTRODUODENOSCOPY (EGD);  Surgeon: Zenovia Jarred, MD;  Location: [facility];  Service: Gastroenterology;  Laterality: N/A;  Dr. Hilarie Fredrickson will do procedure for Dr. Benson Norway Saturday.  . ESOPHAGOGASTRODUODENOSCOPY  05/08/2011   Procedure: ESOPHAGOGASTRODUODENOSCOPY (EGD);  Surgeon: Beryle Beams;  [facility];  Service:  Endoscopy;  Laterality: N/A;  . ESOPHAGOGASTRODUODENOSCOPY  06/07/2011   Procedure: ESOPHAGOGASTRODUODENOSCOPY (EGD);  Surgeon: Beryle Beams, MD;  Location: [facility];  Service: Endoscopy;  Laterality: N/A;  . ESOPHAGOGASTRODUODENOSCOPY  12/20/2011   Procedure: ESOPHAGOGASTRODUODENOSCOPY (EGD);  Surgeon: Beryle Beams, MD;  Location: [facility];  Service: Endoscopy;  Laterality: N/A;  . EYE SURGERY Bilateral    cataracts  . FLEXIBLE SIGMOIDOSCOPY  12/21/2011   Procedure: FLEXIBLE SIGMOIDOSCOPY;  Surgeon: Beryle Beams, MD;  [facility];  Service: Endoscopy;  Laterality: N/A;  . HOT HEMOSTASIS  06/07/2011   Procedure: HOT HEMOSTASIS (ARGON PLASMA COAGULATION/BICAP);  Surgeon: Beryle Beams, MD;  Location: [facility];  Service: Endoscopy;  Laterality: N/A;  . HOT HEMOSTASIS N/A 07/08/2020   Procedure: HOT HEMOSTASIS (ARGON PLASMA COAGULATION/BICAP);  Surgeon: Milus Banister, MD;  Location: [facility];  Service: Endoscopy;  Laterality: N/A;  . INCISIONAL HERNIA REPAIR N/A 06/01/2014   Procedure: ATTEMPTED LAPAROSCOPIC AND OPEN INCISIONAL HERNIA REPAIR WITH MESH;  Surgeon: Coralie Keens, MD;  Location: [place];  Service: General;  Laterality: N/A;  . INSERTION OF MESH N/A 06/01/2014   Procedure: INSERTION OF MESH;  Surgeon: Coralie Keens, MD;  Location: [place];  Service: General;  Laterality: N/A;  . LAPAROTOMY  02/16/2012   Procedure: EXPLORATORY LAPAROTOMY;  Surgeon: Edward Jolly, MD;  [facility];  Service: General;  Laterality: N/A;  oversewing of anastomotic leak and rigid sigmoidoscopy  . LAPAROTOMY N/A 06/21/2014   Procedure: ABDOMINAL WOUND EXPLORATION;  Surgeon: Coralie Keens, MD;  Location: [place];  Service: General;  Laterality: N/A;  . LEG AMPUTATION ABOVE KNEE     left  . PARTIAL COLECTOMY  02/15/2012   Procedure: PARTIAL COLECTOMY;  Surgeon: Harl Bowie, MD;  Location: [facility];  Service: General;  Laterality: N/A;  . SUBMUCOSAL INJECTION   07/08/2020   Procedure: SUBMUCOSAL INJECTION;  Surgeon: Milus Banister, MD;  [facility];  Service: Endoscopy;;  . TONSILLECTOMY      I have reviewed the social history and family history with the patient and they are unchanged from previous note.  ALLERGIES:  is allergic to ciprofloxacin, morphine and related, penicillins, codeine, lisinopril, and feraheme [ferumoxytol].  MEDICATIONS:  Current Outpatient Medications  Medication Sig Dispense Refill  . ACCU-CHEK FASTCLIX LANCETS MISC Use to test blood glucose 3 times daily. 100 each 11  . acetaminophen (TYLENOL) 500 MG tablet Take 1,000 mg by mouth every 6 (six) hours as needed.    Marland Kitchen allopurinol (ZYLOPRIM) 100 MG tablet TAKE 1 TABLET (100 MG TOTAL) BY MOUTH DAILY. 30 tablet 5  . amLODipine (NORVASC) 10 MG tablet Take 1 tablet (10 mg total) by mouth daily. 90 tablet 3  . aspirin EC 81 MG tablet Take 1 tablet (81 mg total) by mouth daily. 30 tablet 1  . Blood Glucose Monitoring Suppl (ACCU-CHEK GUIDE) w/Device KIT 1 each by Does not apply route 3 (three) times daily. The patient is insulin requiring, ICD 10 code E11.65. The patient tests 3 times a day. 1 kit 1  . cetirizine (ZYRTEC) 10 MG  tablet Take 1 tablet (10 mg total) by mouth daily. 30 tablet 0  . cholecalciferol (VITAMIN D) 1000 units tablet Take 1 tablet (1,000 Units total) by mouth daily. 100 tablet 2  . colchicine 0.6 MG tablet TAKE 1 TABLET (0.6 MG TOTAL) BY MOUTH DAILY. (Patient taking differently: Take 0.6 mg by mouth as needed (gout).) 30 tablet 1  . diazepam (VALIUM) 5 MG tablet Take 1 tablet (5 mg total) by mouth at bedtime as needed for anxiety. 4 tablet 0  . Dulaglutide (TRULICITY) 6.06 TK/1.6WF SOPN Inject 0.75 mg into the skin once a week. 6 mL 3  . feeding supplement, GLUCERNA SHAKE, (GLUCERNA SHAKE) LIQD Take 237 mLs by mouth 3 (three) times daily between meals. (Patient taking differently: Take 237 mLs by mouth 2 (two) times daily between meals.) 90 Can 0  .  gabapentin (NEURONTIN) 400 MG capsule Take 1-2 capsules (400-800 mg total) by mouth 3 (three) times daily. 540 capsule 0  . glucose blood (ACCU-CHEK GUIDE) test strip USE TO TEST BLOOD SUGAR THREE TIMES A DAY 100 strip 3  . losartan (COZAAR) 100 MG tablet Take 1 tablet (100 mg total) by mouth daily. 90 tablet 3  . metFORMIN (GLUCOPHAGE) 1000 MG tablet TAKE 1 TABLET BY MOUTH TWICE A DAY WITH A MEAL (Patient taking differently: Take 1,000 mg by mouth daily.) 90 tablet 6  . omeprazole (PRILOSEC) 20 MG capsule Take 1 capsule (20 mg total) by mouth daily. 90 capsule 1   No current facility-administered medications for this visit.   Facility-Administered Medications Ordered in Other Visits  Medication Dose Route Frequency Provider Last Rate Last Admin  . 0.9 %  sodium chloride infusion (Manually program via Guardrails IV Fluids)  250 mL Intravenous Once Truitt Merle, MD        PHYSICAL EXAMINATION: See nursing flow sheet for vitals There were no vitals filed for this visit. There were no vitals filed for this visit.  GENERAL:alert, no distress and comfortable SKIN: No rash EYES: sclera clear LUNGS: clear anteriorly with normal breathing effort HEART: regular rate & rhythm, no RLE edema ABDOMEN:abdomen soft, non-tender and normal bowel sounds Musculoskeletal: L AKA  NEURO: alert & oriented x 3 with fluent speech, no focal motor/sensory deficits  LABORATORY DATA:  I have reviewed the data as listed CBC Latest Ref Rng & Units 08/01/2020 07/25/2020 07/19/2020  WBC 4.0 - 10.5 K/uL 5.9 5.8 5.2  Hemoglobin 12.0 - 15.0 g/dL 7.9(L) 8.3(L) 9.1(L)  Hematocrit 36.0 - 46.0 % 25.6(L) 26.4(L) 28.9(L)  Platelets 150 - 400 K/uL 393 300 267     CMP Latest Ref Rng & Units 07/10/2020 07/09/2020 07/08/2020  Glucose 70 - 99 mg/dL 133(H) 121(H) 119(H)  BUN 8 - 23 mg/dL $Remove'17 18 20  'YgvWTol$ Creatinine 0.44 - 1.00 mg/dL 1.10(H) 1.21(H) 1.40(H)  Sodium 135 - 145 mmol/L 138 135 137  Potassium 3.5 - 5.1 mmol/L 3.5 3.7 3.4(L)   Chloride 98 - 111 mmol/L 103 101 104  CO2 22 - 32 mmol/L $RemoveB'26 25 25  'VHgBBnhE$ Calcium 8.9 - 10.3 mg/dL 9.1 8.8(L) 8.6(L)  Total Protein 6.5 - 8.1 g/dL - - -  Total Bilirubin 0.3 - 1.2 mg/dL - - -  Alkaline Phos 38 - 126 U/L - - -  AST 15 - 41 U/L - - -  ALT 0 - 44 U/L - - -      RADIOGRAPHIC STUDIES: I have personally reviewed the radiological images as listed and agreed with the findings in the report. No results  found.   ASSESSMENT & PLAN: [name] is a 70 y.o. female with    1. Iron deficiency anemia secondary to GIbloodloss,andAnemia of Chronic Disease  -She has a longstanding history of anemia dating back at least 2008 Hg was 10, then periodically normal in 2010 until she presented with nadir Hg 4.4 on 02/22/2010.  Her various work-up showed normal reticulocyte in 04/2010, elevated B12/folate on 03/28/2012, normal haptoglobin on 01/02/2018, and normal SPEP on 12/16/2019 except elevated light chains with normal ratio.  - History of multiple GI bleeding in 2016, required blood transfusion, hospitalization and IV Feraheme -She had an EGD and colonoscopy on 10/29/2017 and 03/17/2018 with Dr. Loletha Carrow, which was normal. Endoscopy in [facility] and was found to have AVM in small bowel.  -She has been on oral iron for 5-6 years now and has hadintermittentblack watery stool.  -She has been receiving IV Feraheme every1-12month on average lately.Goal is to keep her Hg >10and ferritin>100.Her last IV Feraheme wason 06/29/2020 -She developed a headache and increased her daily aspirin to 2 tabs 81 mg and presented with acute GI bleeding in 06/2020, hospitalized and received 1 blood transfusion.  Small bowel endoscopy Dr. Ardis Hughs on 07/09/19 showed non-bleeding AVM. This was cauterized.  CT was negative for occult malignancy. -Ms. Bardwell appears stable without signs of recurrent GI bleeding, however Hgb remains low at 7.9 on 08/01/2020.  She is receiving IV iron and 1u blood today.  -Dr. Burr Medico  previously reviewed the off label use of bevacizumab for AVM related GI bleeding and anemia given that she is requiring frequent Iron and RBCs transfusions again (2 in last month, none since 2017). MD and I reviewed the potential side effects occluding but not limited to HTN, proteinuria, GI perforation and bleeding, and thrombosis. She is interested in consents to treatment.  We will start with bevacizumab every 2 weeks x4 doses for now, starting next week -Primary bone marrow disorder such as MDS has not been ruled out, we will hold on bone marrow biopsy for now and monitor CBC on Avastin and iron. -patient seen with Dr. Burr Medico  2. Porphyria cutaneoustarda (PCT), resolvedafter Hep C Treatment  3.Chronic hepatitis C infection -She tested positive for hepatitis C in 2009. She has previously completed treatment for hepatitis C in 2016 and repeated test was negative.  -05/2018 liver US was unremarkable. -She gets 2 flu shots a year (March and October) and she received her COVID booster in 02/2020.  4. DM2, Trouble sleeping  -Management per PCP's office. -Her PCP started her on Valium $RemoveB'5mg'igweCIQo$  as needed to help her sleep. I recommend she not use this every night as it can lead to dependence.   5. LeftAKA, hasprosthesis, she is wheelchair bound most of time, Right LE Edema  -LE edemacould be related to nutritional deficiencies or decreased venous return. I encouraged her to elevateher right leg.  -On 12/30/19 she had a mechanical fall. For pain she has been on Oxycodone q6hours. Her left shoulder pain has improved but her left stump pain mostly stable. -She notes she has been using crutches and cane more lately and is working towards walking independently.She does not use power chair when she uses [employer].  PLAN: -Labs, previous anemia work up reviewed -IV feraheme and 1 uRBC today -Weekly lab (add repeat anemia w/u next week) -Bevacizumab q2 weeks x4 starting next week  -F/up in 3 weeks  with second Beva  -Patient seen with Dr. Burr Medico   All questions were answered. The  patient knows to call the clinic with any problems, questions or concerns. No barriers to learning were detected.     Alla Feeling, NP 08/03/20   Addendum  I have seen the patient, examined her. I agree with the assessment and and plan and have edited the notes.   Shaiann is here for blood transfusion and iv iron. This is her second blood transfusion in the past one month despite frequent iv iron (previously blood transfusion in 2017). Previous anemia work up was negative for W58, folic acid deficiency and SPEP was negative. Her iron level is not low. I suspect she has mild GI bleeding from her AVMs based on her history. I recommend bevacizumab every 2 weeks for 4 treatments, to see if we can slow dawn her GI bleeding.  I discussed this is off label use, but not limited to, hypertension, proteinuria, small risk of thrombosis, bowel perforation, etc, in great detail, patient agrees to proceed. Plan to start next week if we get her insurance approval.   Truitt Merle  08/03/2020

## 2020-08-03 NOTE — Patient Instructions (Signed)
Blood Transfusion, Adult, Care After This sheet gives you information about how to care for yourself after your procedure. Your doctor may also give you more specific instructions. If you have problems or questions, contact your doctor. What can I expect after the procedure? After the procedure, it is common to have:  Bruising and soreness at the IV site.  A fever or chills on the day of the procedure. This may be your body's response to the new blood cells received.  A headache. Follow these instructions at home: Insertion site care  Follow instructions from your doctor about how to take care of your insertion site. This is where an IV tube was put into your vein. Make sure you: ? Wash your hands with soap and water before and after you change your bandage (dressing). If you cannot use soap and water, use hand sanitizer. ? Change your bandage as told by your doctor.  Check your insertion site every day for signs of infection. Check for: ? Redness, swelling, or pain. ? Bleeding from the site. ? Warmth. ? Pus or a bad smell.      General instructions  Take over-the-counter and prescription medicines only as told by your doctor.  Rest as told by your doctor.  Go back to your normal activities as told by your doctor.  Keep all follow-up visits as told by your doctor. This is important. Contact a doctor if:  You have itching or red, swollen areas of skin (hives).  You feel worried or nervous (anxious).  You feel weak after doing your normal activities.  You have redness, swelling, warmth, or pain around the insertion site.  You have blood coming from the insertion site, and the blood does not stop with pressure.  You have pus or a bad smell coming from the insertion site. Get help right away if:  You have signs of a serious reaction. This may be coming from an allergy or the body's defense system (immune system). Signs include: ? Trouble breathing or shortness of  breath. ? Swelling of the face or feeling warm (flushed). ? Fever or chills. ? Head, chest, or back pain. ? Dark pee (urine) or blood in the pee. ? Widespread rash. ? Fast heartbeat. ? Feeling dizzy or light-headed. You may receive your blood transfusion in an outpatient setting. If so, you will be told whom to contact to report any reactions. These symptoms may be an emergency. Do not wait to see if the symptoms will go away. Get medical help right away. Call your local emergency services (911 in the [place] Do not drive yourself to the hospital. Summary  Bruising and soreness at the IV site are common.  Check your insertion site every day for signs of infection.  Rest as told by your doctor. Go back to your normal activities as told by your doctor.  Get help right away if you have signs of a serious reaction. This information is not intended to replace advice given to you by your health care provider. Make sure you discuss any questions you have with your health care provider. Document Revised: 11/06/2018 Document Reviewed: 11/06/2018 Elsevier Patient Education  2021 Elsevier Inc.  Ferumoxytol injection What is this medicine? FERUMOXYTOL is an iron complex. Iron is used to make healthy red blood cells, which carry oxygen and nutrients throughout the body. This medicine is used to treat iron deficiency anemia. This medicine may be used for other purposes; ask your health care provider or pharmacist if you   have questions. COMMON BRAND NAME(S): Feraheme What should I tell my health care provider before I take this medicine? They need to know if you have any of these conditions:  anemia not caused by low iron levels  high levels of iron in the blood  magnetic resonance imaging (MRI) test scheduled  an unusual or allergic reaction to iron, other medicines, foods, dyes, or preservatives  pregnant or trying to get pregnant  breast-feeding How should I use this medicine? This  medicine is for injection into a vein. It is given by a health care professional in a hospital or clinic setting. Talk to your pediatrician regarding the use of this medicine in children. Special care may be needed. Overdosage: If you think you have taken too much of this medicine contact a poison control center or emergency room at once. NOTE: This medicine is only for you. Do not share this medicine with others. What if I miss a dose? It is important not to miss your dose. Call your doctor or health care professional if you are unable to keep an appointment. What may interact with this medicine? This medicine may interact with the following medications:  other iron products This list may not describe all possible interactions. Give your health care provider a list of all the medicines, herbs, non-prescription drugs, or dietary supplements you use. Also tell them if you smoke, drink alcohol, or use illegal drugs. Some items may interact with your medicine. What should I watch for while using this medicine? Visit your doctor or healthcare professional regularly. Tell your doctor or healthcare professional if your symptoms do not start to get better or if they get worse. You may need blood work done while you are taking this medicine. You may need to follow a special diet. Talk to your doctor. Foods that contain iron include: whole grains/cereals, dried fruits, beans, or peas, leafy green vegetables, and organ meats (liver, kidney). What side effects may I notice from receiving this medicine? Side effects that you should report to your doctor or health care professional as soon as possible:  allergic reactions like skin rash, itching or hives, swelling of the face, lips, or tongue  breathing problems  changes in blood pressure  feeling faint or lightheaded, falls  fever or chills  flushing, sweating, or hot feelings  swelling of the ankles or feet Side effects that usually do not require  medical attention (report to your doctor or health care professional if they continue or are bothersome):  diarrhea  headache  nausea, vomiting  stomach pain This list may not describe all possible side effects. Call your doctor for medical advice about side effects. You may report side effects to FDA at 1-800-FDA-1088. Where should I keep my medicine? This drug is given in a hospital or clinic and will not be stored at home. NOTE: This sheet is a summary. It may not cover all possible information. If you have questions about this medicine, talk to your doctor, pharmacist, or health care provider.  2021 Elsevier/Gold Standard (2016-07-02 20:21:10)   

## 2020-08-04 LAB — BPAM RBC
Blood Product Expiration Date: 202204012359
ISSUE DATE / TIME: 202203091155
Unit Type and Rh: 6200

## 2020-08-04 LAB — TYPE AND SCREEN
ABO/RH(D): AB POS
Antibody Screen: NEGATIVE
Unit division: 0

## 2020-08-08 ENCOUNTER — Other Ambulatory Visit: Payer: Self-pay | Admitting: Internal Medicine

## 2020-08-08 ENCOUNTER — Ambulatory Visit: Payer: Medicare Other

## 2020-08-08 ENCOUNTER — Other Ambulatory Visit: Payer: Medicare Other

## 2020-08-08 ENCOUNTER — Ambulatory Visit: Payer: Medicare Other | Admitting: Hematology

## 2020-08-08 DIAGNOSIS — G479 Sleep disorder, unspecified: Secondary | ICD-10-CM

## 2020-08-08 DIAGNOSIS — M109 Gout, unspecified: Secondary | ICD-10-CM

## 2020-08-08 DIAGNOSIS — S78112A Complete traumatic amputation at level between left hip and knee, initial encounter: Secondary | ICD-10-CM

## 2020-08-09 ENCOUNTER — Other Ambulatory Visit: Payer: Self-pay | Admitting: Internal Medicine

## 2020-08-09 ENCOUNTER — Other Ambulatory Visit: Payer: Self-pay | Admitting: Student

## 2020-08-09 DIAGNOSIS — S78112A Complete traumatic amputation at level between left hip and knee, initial encounter: Secondary | ICD-10-CM

## 2020-08-09 MED ORDER — COLCHICINE 0.6 MG PO TABS: 0.6000 mg | ORAL_TABLET | ORAL | 0 refills | Status: DC | PRN

## 2020-08-09 MED ORDER — CETIRIZINE HCL 10 MG PO TABS: 10.0000 mg | ORAL_TABLET | Freq: Every day | ORAL | 0 refills | Status: DC

## 2020-08-10 ENCOUNTER — Inpatient Hospital Stay: Payer: Medicare Other

## 2020-08-10 ENCOUNTER — Other Ambulatory Visit: Payer: Self-pay | Admitting: Student

## 2020-08-10 ENCOUNTER — Other Ambulatory Visit: Payer: Self-pay

## 2020-08-10 ENCOUNTER — Ambulatory Visit: Payer: Medicare Other | Admitting: Hematology

## 2020-08-10 VITALS — BP 124/77 | HR 80 | Temp 98.1°F | Resp 17 | Wt 157.0 lb

## 2020-08-10 DIAGNOSIS — D500 Iron deficiency anemia secondary to blood loss (chronic): Secondary | ICD-10-CM

## 2020-08-10 DIAGNOSIS — K922 Gastrointestinal hemorrhage, unspecified: Secondary | ICD-10-CM

## 2020-08-10 DIAGNOSIS — M109 Gout, unspecified: Secondary | ICD-10-CM

## 2020-08-10 DIAGNOSIS — D509 Iron deficiency anemia, unspecified: Secondary | ICD-10-CM | POA: Diagnosis not present

## 2020-08-10 LAB — URINALYSIS, COMPLETE (UACMP) WITH MICROSCOPIC
Bacteria, UA: NONE SEEN
Bilirubin Urine: NEGATIVE
Glucose, UA: NEGATIVE mg/dL
Hgb urine dipstick: NEGATIVE
Ketones, ur: NEGATIVE mg/dL
Nitrite: NEGATIVE
Protein, ur: 100 mg/dL — AB
Specific Gravity, Urine: 1.01 (ref 1.005–1.030)
pH: 5 (ref 5.0–8.0)

## 2020-08-10 LAB — CBC WITH DIFFERENTIAL (CANCER CENTER ONLY)
Abs Immature Granulocytes: 0.05 10*3/uL (ref 0.00–0.07)
Basophils Absolute: 0.1 10*3/uL (ref 0.0–0.1)
Basophils Relative: 1 %
Eosinophils Absolute: 0.2 10*3/uL (ref 0.0–0.5)
Eosinophils Relative: 3 %
HCT: 33.6 % — ABNORMAL LOW (ref 36.0–46.0)
Hemoglobin: 10.8 g/dL — ABNORMAL LOW (ref 12.0–15.0)
Immature Granulocytes: 1 %
Lymphocytes Relative: 15 %
Lymphs Abs: 0.9 10*3/uL (ref 0.7–4.0)
MCH: 32.7 pg (ref 26.0–34.0)
MCHC: 32.1 g/dL (ref 30.0–36.0)
MCV: 101.8 fL — ABNORMAL HIGH (ref 80.0–100.0)
Monocytes Absolute: 0.6 10*3/uL (ref 0.1–1.0)
Monocytes Relative: 10 %
Neutro Abs: 4.6 10*3/uL (ref 1.7–7.7)
Neutrophils Relative %: 70 %
Platelet Count: 288 10*3/uL (ref 150–400)
RBC: 3.3 MIL/uL — ABNORMAL LOW (ref 3.87–5.11)
RDW: 15.9 % — ABNORMAL HIGH (ref 11.5–15.5)
WBC Count: 6.4 10*3/uL (ref 4.0–10.5)
nRBC: 0 % (ref 0.0–0.2)

## 2020-08-10 LAB — RETIC PANEL
Immature Retic Fract: 12.1 % (ref 2.3–15.9)
RBC.: 3.33 MIL/uL — ABNORMAL LOW (ref 3.87–5.11)
Retic Count, Absolute: 126.5 10*3/uL (ref 19.0–186.0)
Retic Ct Pct: 3.8 % — ABNORMAL HIGH (ref 0.4–3.1)
Reticulocyte Hemoglobin: 36.3 pg (ref >27.9)

## 2020-08-10 LAB — SAMPLE TO BLOOD BANK

## 2020-08-10 LAB — TOTAL PROTEIN, URINE DIPSTICK: Protein, ur: 30 mg/dL — AB

## 2020-08-10 LAB — FOLATE: Folate: 7.7 ng/mL (ref >5.9)

## 2020-08-10 LAB — FERRITIN: Ferritin: 349 ng/mL — ABNORMAL HIGH (ref 11–307)

## 2020-08-10 LAB — VITAMIN B12: Vitamin B-12: 611 pg/mL (ref 180–914)

## 2020-08-10 LAB — LACTATE DEHYDROGENASE: LDH: 143 U/L (ref 98–192)

## 2020-08-10 MED ORDER — SODIUM CHLORIDE 0.9 % IV SOLN
Freq: Once | INTRAVENOUS | Status: AC
  Filled 2020-08-10: qty 250

## 2020-08-10 MED ORDER — DIPHENHYDRAMINE HCL 50 MG/ML IJ SOLN
INTRAMUSCULAR | Status: AC
  Filled 2020-08-10: qty 1

## 2020-08-10 MED ORDER — FAMOTIDINE 20 MG PO TABS
20.0000 mg | ORAL_TABLET | Freq: Once | ORAL | Status: AC
  Administered 2020-08-10: 20 mg via ORAL

## 2020-08-10 MED ORDER — SODIUM CHLORIDE 0.9 % IV SOLN
510.0000 mg | Freq: Once | INTRAVENOUS | Status: AC
  Administered 2020-08-10: 510 mg via INTRAVENOUS
  Filled 2020-08-10: qty 510

## 2020-08-10 MED ORDER — FAMOTIDINE 20 MG PO TABS
ORAL_TABLET | ORAL | Status: AC
  Filled 2020-08-10: qty 1

## 2020-08-10 MED ORDER — DIPHENHYDRAMINE HCL 50 MG/ML IJ SOLN
50.0000 mg | Freq: Once | INTRAMUSCULAR | Status: AC
  Administered 2020-08-10: 50 mg via INTRAVENOUS

## 2020-08-10 MED ORDER — SODIUM CHLORIDE 0.9 % IV SOLN
5.0000 mg/kg | Freq: Once | INTRAVENOUS | Status: AC
  Administered 2020-08-10: 400 mg via INTRAVENOUS
  Filled 2020-08-10: qty 16

## 2020-08-10 NOTE — Patient Instructions (Signed)
[facility] Discharge Instructions for Patients Receiving Chemotherapy  Today you received the following chemotherapy agents [id number]   To help prevent nausea and vomiting after your treatment, we encourage you to take your nausea medication as directed.   If you develop nausea and vomiting that is not controlled by your nausea medication, call the clinic.   BELOW ARE SYMPTOMS THAT SHOULD BE REPORTED IMMEDIATELY:  *FEVER GREATER THAN 100.5 F  *CHILLS WITH OR WITHOUT FEVER  NAUSEA AND VOMITING THAT IS NOT CONTROLLED WITH YOUR NAUSEA MEDICATION  *UNUSUAL SHORTNESS OF BREATH  *UNUSUAL BRUISING OR BLEEDING  TENDERNESS IN MOUTH AND THROAT WITH OR WITHOUT PRESENCE OF ULCERS  *URINARY PROBLEMS  *BOWEL PROBLEMS  UNUSUAL RASH Items with * indicate a potential emergency and should be followed up as soon as possible.  Feel free to call the clinic should you have any questions or concerns. The clinic phone number is [phone].  Please show the CHEMO ALERT CARD at check-in to the Emergency Department and triage nurse.  Bevacizumab injection What is this medicine? BEVACIZUMAB (be va SIZ yoo mab) is a monoclonal antibody. It is used to treat many types of cancer. This medicine may be used for other purposes; ask your health care provider or pharmacist if you have questions. COMMON BRAND NAME(S): Avastin, MVASI, Zirabev What should I tell my health care provider before I take this medicine? They need to know if you have any of these conditions:  diabetes  heart disease  high blood pressure  history of coughing up blood  prior anthracycline chemotherapy (e.g., doxorubicin, daunorubicin, epirubicin)  recent or ongoing radiation therapy  recent or planning to have surgery  stroke  an unusual or allergic reaction to bevacizumab, hamster proteins, mouse proteins, other medicines, foods, dyes, or preservatives  pregnant or trying to get  pregnant  breast-feeding How should I use this medicine? This medicine is for infusion into a vein. It is given by a health care professional in a hospital or clinic setting. Talk to your pediatrician regarding the use of this medicine in children. Special care may be needed. Overdosage: If you think you have taken too much of this medicine contact a poison control center or emergency room at once. NOTE: This medicine is only for you. Do not share this medicine with others. What if I miss a dose? It is important not to miss your dose. Call your doctor or health care professional if you are unable to keep an appointment. What may interact with this medicine? Interactions are not expected. This list may not describe all possible interactions. Give your health care provider a list of all the medicines, herbs, non-prescription drugs, or dietary supplements you use. Also tell them if you smoke, drink alcohol, or use illegal drugs. Some items may interact with your medicine. What should I watch for while using this medicine? Your condition will be monitored carefully while you are receiving this medicine. You will need important blood work and urine testing done while you are taking this medicine. This medicine may increase your risk to bruise or bleed. Call your doctor or health care professional if you notice any unusual bleeding. Before having surgery, talk to your health care provider to make sure it is ok. This drug can increase the risk of poor healing of your surgical site or wound. You will need to stop this drug for 28 days before surgery. After surgery, wait at least 28 days before restarting this drug. Make sure the   surgical site or wound is healed enough before restarting this drug. Talk to your health care provider if questions. Do not become pregnant while taking this medicine or for 6 months after stopping it. Women should inform their doctor if they wish to become pregnant or think they  might be pregnant. There is a potential for serious side effects to an unborn child. Talk to your health care professional or pharmacist for more information. Do not breast-feed an infant while taking this medicine and for 6 months after the last dose. This medicine has caused ovarian failure in some women. This medicine may interfere with the ability to have a child. You should talk to your doctor or health care professional if you are concerned about your fertility. What side effects may I notice from receiving this medicine? Side effects that you should report to your doctor or health care professional as soon as possible:  allergic reactions like skin rash, itching or hives, swelling of the face, lips, or tongue  chest pain or chest tightness  chills  coughing up blood  high fever  seizures  severe constipation  signs and symptoms of bleeding such as bloody or black, tarry stools; red or dark-brown urine; spitting up blood or brown material that looks like coffee grounds; red spots on the skin; unusual bruising or bleeding from the eye, gums, or nose  signs and symptoms of a blood clot such as breathing problems; chest pain; severe, sudden headache; pain, swelling, warmth in the leg  signs and symptoms of a stroke like changes in vision; confusion; trouble speaking or understanding; severe headaches; sudden numbness or weakness of the face, arm or leg; trouble walking; dizziness; loss of balance or coordination  stomach pain  sweating  swelling of legs or ankles  vomiting  weight gain Side effects that usually do not require medical attention (report to your doctor or health care professional if they continue or are bothersome):  back pain  changes in taste  decreased appetite  dry skin  nausea  tiredness This list may not describe all possible side effects. Call your doctor for medical advice about side effects. You may report side effects to FDA at  1-800-FDA-1088. Where should I keep my medicine? This drug is given in a hospital or clinic and will not be stored at home. NOTE: This sheet is a summary. It may not cover all possible information. If you have questions about this medicine, talk to your doctor, pharmacist, or health care provider.  2021 Elsevier/Gold Standard (2019-03-11 10:50:46)  Ferumoxytol injection What is this medicine? FERUMOXYTOL is an iron complex. Iron is used to make healthy red blood cells, which carry oxygen and nutrients throughout the body. This medicine is used to treat iron deficiency anemia. This medicine may be used for other purposes; ask your health care provider or pharmacist if you have questions. COMMON BRAND NAME(S): Feraheme What should I tell my health care provider before I take this medicine? They need to know if you have any of these conditions:  anemia not caused by low iron levels  high levels of iron in the blood  magnetic resonance imaging (MRI) test scheduled  an unusual or allergic reaction to iron, other medicines, foods, dyes, or preservatives  pregnant or trying to get pregnant  breast-feeding How should I use this medicine? This medicine is for injection into a vein. It is given by a health care professional in a hospital or clinic setting. Talk to your pediatrician regarding the   use of this medicine in children. Special care may be needed. Overdosage: If you think you have taken too much of this medicine contact a poison control center or emergency room at once. NOTE: This medicine is only for you. Do not share this medicine with others. What if I miss a dose? It is important not to miss your dose. Call your doctor or health care professional if you are unable to keep an appointment. What may interact with this medicine? This medicine may interact with the following medications:  other iron products This list may not describe all possible interactions. Give your health  care provider a list of all the medicines, herbs, non-prescription drugs, or dietary supplements you use. Also tell them if you smoke, drink alcohol, or use illegal drugs. Some items may interact with your medicine. What should I watch for while using this medicine? Visit your doctor or healthcare professional regularly. Tell your doctor or healthcare professional if your symptoms do not start to get better or if they get worse. You may need blood work done while you are taking this medicine. You may need to follow a special diet. Talk to your doctor. Foods that contain iron include: whole grains/cereals, dried fruits, beans, or peas, leafy green vegetables, and organ meats (liver, kidney). What side effects may I notice from receiving this medicine? Side effects that you should report to your doctor or health care professional as soon as possible:  allergic reactions like skin rash, itching or hives, swelling of the face, lips, or tongue  breathing problems  changes in blood pressure  feeling faint or lightheaded, falls  fever or chills  flushing, sweating, or hot feelings  swelling of the ankles or feet Side effects that usually do not require medical attention (report to your doctor or health care professional if they continue or are bothersome):  diarrhea  headache  nausea, vomiting  stomach pain This list may not describe all possible side effects. Call your doctor for medical advice about side effects. You may report side effects to FDA at 1-800-FDA-1088. Where should I keep my medicine? This drug is given in a hospital or clinic and will not be stored at home. NOTE: This sheet is a summary. It may not cover all possible information. If you have questions about this medicine, talk to your doctor, pharmacist, or health care provider.  2021 Elsevier/Gold Standard (2016-07-02 20:21:10)  

## 2020-08-10 NOTE — Progress Notes (Signed)
Patient tolerated both feraheme & avastin infusions well with no complaints. Vitals stable upon discharge, patient in no distress.

## 2020-08-10 NOTE — Progress Notes (Signed)
Ok to give 400mg  bev 3/16 as dosed on earlier weight.  Need to look at with next treatment

## 2020-08-11 ENCOUNTER — Telehealth: Payer: Self-pay | Admitting: *Deleted

## 2020-08-11 LAB — FOLATE RBC
Folate, Hemolysate: 374 ng/mL
Folate, RBC: 1107 ng/mL (ref >498)
Hematocrit: 33.8 % — ABNORMAL LOW (ref 34.0–46.6)

## 2020-08-11 LAB — HAPTOGLOBIN: Haptoglobin: 54 mg/dL (ref 37–355)

## 2020-08-16 ENCOUNTER — Inpatient Hospital Stay: Payer: Medicare Other | Admitting: Dietician

## 2020-08-17 ENCOUNTER — Inpatient Hospital Stay: Payer: Medicare Other

## 2020-08-17 ENCOUNTER — Other Ambulatory Visit: Payer: Self-pay

## 2020-08-17 DIAGNOSIS — D509 Iron deficiency anemia, unspecified: Secondary | ICD-10-CM | POA: Diagnosis not present

## 2020-08-17 DIAGNOSIS — K922 Gastrointestinal hemorrhage, unspecified: Secondary | ICD-10-CM | POA: Diagnosis not present

## 2020-08-17 DIAGNOSIS — D500 Iron deficiency anemia secondary to blood loss (chronic): Secondary | ICD-10-CM

## 2020-08-17 LAB — CBC WITH DIFFERENTIAL (CANCER CENTER ONLY)
Abs Immature Granulocytes: 0.04 K/uL (ref 0.00–0.07)
Basophils Absolute: 0 K/uL (ref 0.0–0.1)
Basophils Relative: 0 %
Eosinophils Absolute: 0 K/uL (ref 0.0–0.5)
Eosinophils Relative: 0 %
HCT: 38.7 % (ref 36.0–46.0)
Hemoglobin: 12.8 g/dL (ref 12.0–15.0)
Immature Granulocytes: 0 %
Lymphocytes Relative: 8 %
Lymphs Abs: 0.8 K/uL (ref 0.7–4.0)
MCH: 33.2 pg (ref 26.0–34.0)
MCHC: 33.1 g/dL (ref 30.0–36.0)
MCV: 100.3 fL — ABNORMAL HIGH (ref 80.0–100.0)
Monocytes Absolute: 0.8 K/uL (ref 0.1–1.0)
Monocytes Relative: 8 %
Neutro Abs: 8.8 K/uL — ABNORMAL HIGH (ref 1.7–7.7)
Neutrophils Relative %: 84 %
Platelet Count: 238 K/uL (ref 150–400)
RBC: 3.86 MIL/uL — ABNORMAL LOW (ref 3.87–5.11)
RDW: 15.8 % — ABNORMAL HIGH (ref 11.5–15.5)
WBC Count: 10.5 K/uL (ref 4.0–10.5)
nRBC: 0 % (ref 0.0–0.2)

## 2020-08-17 LAB — URINALYSIS, DIPSTICK ONLY
Bilirubin Urine: NEGATIVE
Glucose, UA: NEGATIVE mg/dL
Ketones, ur: NEGATIVE mg/dL
Leukocytes,Ua: NEGATIVE
Nitrite: NEGATIVE
Protein, ur: 100 mg/dL — AB
Specific Gravity, Urine: 1.006 (ref 1.005–1.030)
pH: 5 (ref 5.0–8.0)

## 2020-08-17 LAB — FERRITIN: Ferritin: 487 ng/mL — ABNORMAL HIGH (ref 11–307)

## 2020-08-24 ENCOUNTER — Encounter: Payer: Self-pay | Admitting: Nurse Practitioner

## 2020-08-24 ENCOUNTER — Inpatient Hospital Stay (HOSPITAL_BASED_OUTPATIENT_CLINIC_OR_DEPARTMENT_OTHER): Payer: Medicare Other | Admitting: Nurse Practitioner

## 2020-08-24 ENCOUNTER — Inpatient Hospital Stay: Payer: Medicare Other | Admitting: Dietician

## 2020-08-24 ENCOUNTER — Other Ambulatory Visit: Payer: Self-pay

## 2020-08-24 ENCOUNTER — Inpatient Hospital Stay: Payer: Medicare Other

## 2020-08-24 VITALS — BP 138/85 | HR 84 | Temp 97.8°F | Resp 18 | Ht 62.0 in | Wt 158.5 lb

## 2020-08-24 DIAGNOSIS — D500 Iron deficiency anemia secondary to blood loss (chronic): Secondary | ICD-10-CM

## 2020-08-24 DIAGNOSIS — D509 Iron deficiency anemia, unspecified: Secondary | ICD-10-CM | POA: Diagnosis not present

## 2020-08-24 DIAGNOSIS — K922 Gastrointestinal hemorrhage, unspecified: Secondary | ICD-10-CM

## 2020-08-24 LAB — IRON AND TIBC
Iron: 66 ug/dL (ref 41–142)
Saturation Ratios: 23 % (ref 21–57)
TIBC: 283 ug/dL (ref 236–444)
UIBC: 216 ug/dL (ref 120–384)

## 2020-08-24 LAB — CBC WITH DIFFERENTIAL (CANCER CENTER ONLY)
Abs Immature Granulocytes: 0.06 10*3/uL (ref 0.00–0.07)
Basophils Absolute: 0 10*3/uL (ref 0.0–0.1)
Basophils Relative: 1 %
Eosinophils Absolute: 0.1 10*3/uL (ref 0.0–0.5)
Eosinophils Relative: 2 %
HCT: 40.4 % (ref 36.0–46.0)
Hemoglobin: 13.3 g/dL (ref 12.0–15.0)
Immature Granulocytes: 1 %
Lymphocytes Relative: 13 %
Lymphs Abs: 0.9 10*3/uL (ref 0.7–4.0)
MCH: 33.6 pg (ref 26.0–34.0)
MCHC: 32.9 g/dL (ref 30.0–36.0)
MCV: 102 fL — ABNORMAL HIGH (ref 80.0–100.0)
Monocytes Absolute: 0.7 10*3/uL (ref 0.1–1.0)
Monocytes Relative: 11 %
Neutro Abs: 4.7 10*3/uL (ref 1.7–7.7)
Neutrophils Relative %: 72 %
Platelet Count: 243 10*3/uL (ref 150–400)
RBC: 3.96 MIL/uL (ref 3.87–5.11)
RDW: 14.5 % (ref 11.5–15.5)
WBC Count: 6.4 10*3/uL (ref 4.0–10.5)
nRBC: 0 % (ref 0.0–0.2)

## 2020-08-24 LAB — BASIC METABOLIC PANEL - CANCER CENTER ONLY
Anion gap: 10 (ref 5–15)
BUN: 22 mg/dL (ref 8–23)
CO2: 27 mmol/L (ref 22–32)
Calcium: 8.7 mg/dL — ABNORMAL LOW (ref 8.9–10.3)
Chloride: 107 mmol/L (ref 98–111)
Creatinine: 1.06 mg/dL — ABNORMAL HIGH (ref 0.44–1.00)
GFR, Estimated: 58 mL/min — ABNORMAL LOW (ref >60)
Glucose, Bld: 118 mg/dL — ABNORMAL HIGH (ref 70–99)
Potassium: 4.4 mmol/L (ref 3.5–5.1)
Sodium: 144 mmol/L (ref 135–145)

## 2020-08-24 LAB — TOTAL PROTEIN, URINE DIPSTICK: Protein, ur: 100 mg/dL — AB

## 2020-08-24 LAB — FERRITIN: Ferritin: 257 ng/mL (ref 11–307)

## 2020-08-24 MED ORDER — SODIUM CHLORIDE 0.9 % IV SOLN
Freq: Once | INTRAVENOUS | Status: AC
  Filled 2020-08-24: qty 250

## 2020-08-24 MED ORDER — DIPHENHYDRAMINE HCL 50 MG/ML IJ SOLN
INTRAMUSCULAR | Status: AC
  Filled 2020-08-24: qty 1

## 2020-08-24 MED ORDER — DIPHENHYDRAMINE HCL 50 MG/ML IJ SOLN
50.0000 mg | Freq: Once | INTRAMUSCULAR | Status: AC
Start: 2020-08-24 — End: 2020-08-24
  Administered 2020-08-24: 50 mg via INTRAVENOUS

## 2020-08-24 MED ORDER — FAMOTIDINE 20 MG PO TABS
ORAL_TABLET | ORAL | Status: AC
  Filled 2020-08-24: qty 1

## 2020-08-24 MED ORDER — SODIUM CHLORIDE 0.9 % IV SOLN
510.0000 mg | Freq: Once | INTRAVENOUS | Status: AC
  Administered 2020-08-24: 510 mg via INTRAVENOUS
  Filled 2020-08-24: qty 510

## 2020-08-24 MED ORDER — FAMOTIDINE 20 MG PO TABS
20.0000 mg | ORAL_TABLET | Freq: Once | ORAL | Status: AC
Start: 2020-08-24 — End: 2020-08-24
  Administered 2020-08-24: 20 mg via ORAL

## 2020-08-24 MED ORDER — SODIUM CHLORIDE 0.9 % IV SOLN
5.0000 mg/kg | Freq: Once | INTRAVENOUS | Status: AC
  Administered 2020-08-24: 400 mg via INTRAVENOUS
  Filled 2020-08-24: qty 16

## 2020-08-24 NOTE — Patient Instructions (Signed)
[facility] Discharge Instructions for Patients Receiving Chemotherapy  Today you received the following chemotherapy agents [id number]   To help prevent nausea and vomiting after your treatment, we encourage you to take your nausea medication as directed.   If you develop nausea and vomiting that is not controlled by your nausea medication, call the clinic.   BELOW ARE SYMPTOMS THAT SHOULD BE REPORTED IMMEDIATELY:  *FEVER GREATER THAN 100.5 F  *CHILLS WITH OR WITHOUT FEVER  NAUSEA AND VOMITING THAT IS NOT CONTROLLED WITH YOUR NAUSEA MEDICATION  *UNUSUAL SHORTNESS OF BREATH  *UNUSUAL BRUISING OR BLEEDING  TENDERNESS IN MOUTH AND THROAT WITH OR WITHOUT PRESENCE OF ULCERS  *URINARY PROBLEMS  *BOWEL PROBLEMS  UNUSUAL RASH Items with * indicate a potential emergency and should be followed up as soon as possible.  Feel free to call the clinic should you have any questions or concerns. The clinic phone number is [phone].  Please show the [place] at check-in to the Emergency Department and triage nurse.  Bevacizumab injection What is this medicine? BEVACIZUMAB (be va SIZ yoo mab) is a monoclonal antibody. It is used to treat many types of cancer. This medicine may be used for other purposes; ask your health care provider or pharmacist if you have questions. COMMON BRAND NAME(S): Avastin, MVASI, Zirabev What should I tell my health care provider before I take this medicine? They need to know if you have any of these conditions:  diabetes  heart disease  high blood pressure  history of coughing up blood  prior anthracycline chemotherapy (e.g., doxorubicin, daunorubicin, epirubicin)  recent or ongoing radiation therapy  recent or planning to have surgery  stroke  an unusual or allergic reaction to bevacizumab, hamster proteins, mouse proteins, other medicines, foods, dyes, or preservatives  pregnant or trying to get  pregnant  breast-feeding How should I use this medicine? This medicine is for infusion into a vein. It is given by a health care professional in a hospital or clinic setting. Talk to your pediatrician regarding the use of this medicine in children. Special care may be needed. Overdosage: If you think you have taken too much of this medicine contact a poison control center or emergency room at once. NOTE: This medicine is only for you. Do not share this medicine with others. What if I miss a dose? It is important not to miss your dose. Call your doctor or health care professional if you are unable to keep an appointment. What may interact with this medicine? Interactions are not expected. This list may not describe all possible interactions. Give your health care provider a list of all the medicines, herbs, non-prescription drugs, or dietary supplements you use. Also tell them if you smoke, drink alcohol, or use illegal drugs. Some items may interact with your medicine. What should I watch for while using this medicine? Your condition will be monitored carefully while you are receiving this medicine. You will need important blood work and urine testing done while you are taking this medicine. This medicine may increase your risk to bruise or bleed. Call your doctor or health care professional if you notice any unusual bleeding. Before having surgery, talk to your health care provider to make sure it is ok. This drug can increase the risk of poor healing of your surgical site or wound. You will need to stop this drug for 28 days before surgery. After surgery, wait at least 28 days before restarting this drug. Make sure the  surgical site or wound is healed enough before restarting this drug. Talk to your health care provider if questions. Do not become pregnant while taking this medicine or for 6 months after stopping it. Women should inform their doctor if they wish to become pregnant or think they  might be pregnant. There is a potential for serious side effects to an unborn child. Talk to your health care professional or pharmacist for more information. Do not breast-feed an infant while taking this medicine and for 6 months after the last dose. This medicine has caused ovarian failure in some women. This medicine may interfere with the ability to have a child. You should talk to your doctor or health care professional if you are concerned about your fertility. What side effects may I notice from receiving this medicine? Side effects that you should report to your doctor or health care professional as soon as possible:  allergic reactions like skin rash, itching or hives, swelling of the face, lips, or tongue  chest pain or chest tightness  chills  coughing up blood  high fever  seizures  severe constipation  signs and symptoms of bleeding such as bloody or black, tarry stools; red or dark-brown urine; spitting up blood or brown material that looks like coffee grounds; red spots on the skin; unusual bruising or bleeding from the eye, gums, or nose  signs and symptoms of a blood clot such as breathing problems; chest pain; severe, sudden headache; pain, swelling, warmth in the leg  signs and symptoms of a stroke like changes in vision; confusion; trouble speaking or understanding; severe headaches; sudden numbness or weakness of the face, arm or leg; trouble walking; dizziness; loss of balance or coordination  stomach pain  sweating  swelling of legs or ankles  vomiting  weight gain Side effects that usually do not require medical attention (report to your doctor or health care professional if they continue or are bothersome):  back pain  changes in taste  decreased appetite  dry skin  nausea  tiredness This list may not describe all possible side effects. Call your doctor for medical advice about side effects. You may report side effects to FDA at  1-800-FDA-1088. Where should I keep my medicine? This drug is given in a hospital or clinic and will not be stored at home. NOTE: This sheet is a summary. It may not cover all possible information. If you have questions about this medicine, talk to your doctor, pharmacist, or health care provider.  2021 Elsevier/Gold Standard (2019-03-11 10:50:46)  Ferumoxytol injection What is this medicine? FERUMOXYTOL is an iron complex. Iron is used to make healthy red blood cells, which carry oxygen and nutrients throughout the body. This medicine is used to treat iron deficiency anemia. This medicine may be used for other purposes; ask your health care provider or pharmacist if you have questions. COMMON BRAND NAME(S): Feraheme What should I tell my health care provider before I take this medicine? They need to know if you have any of these conditions:  anemia not caused by low iron levels  high levels of iron in the blood  magnetic resonance imaging (MRI) test scheduled  an unusual or allergic reaction to iron, other medicines, foods, dyes, or preservatives  pregnant or trying to get pregnant  breast-feeding How should I use this medicine? This medicine is for injection into a vein. It is given by a health care professional in a hospital or clinic setting. Talk to your pediatrician regarding the  use of this medicine in children. Special care may be needed. Overdosage: If you think you have taken too much of this medicine contact a poison control center or emergency room at once. NOTE: This medicine is only for you. Do not share this medicine with others. What if I miss a dose? It is important not to miss your dose. Call your doctor or health care professional if you are unable to keep an appointment. What may interact with this medicine? This medicine may interact with the following medications:  other iron products This list may not describe all possible interactions. Give your health  care provider a list of all the medicines, herbs, non-prescription drugs, or dietary supplements you use. Also tell them if you smoke, drink alcohol, or use illegal drugs. Some items may interact with your medicine. What should I watch for while using this medicine? Visit your doctor or healthcare professional regularly. Tell your doctor or healthcare professional if your symptoms do not start to get better or if they get worse. You may need blood work done while you are taking this medicine. You may need to follow a special diet. Talk to your doctor. Foods that contain iron include: whole grains/cereals, dried fruits, beans, or peas, leafy green vegetables, and organ meats (liver, kidney). What side effects may I notice from receiving this medicine? Side effects that you should report to your doctor or health care professional as soon as possible:  allergic reactions like skin rash, itching or hives, swelling of the face, lips, or tongue  breathing problems  changes in blood pressure  feeling faint or lightheaded, falls  fever or chills  flushing, sweating, or hot feelings  swelling of the ankles or feet Side effects that usually do not require medical attention (report to your doctor or health care professional if they continue or are bothersome):  diarrhea  headache  nausea, vomiting  stomach pain This list may not describe all possible side effects. Call your doctor for medical advice about side effects. You may report side effects to FDA at 1-800-FDA-1088. Where should I keep my medicine? This drug is given in a hospital or clinic and will not be stored at home. NOTE: This sheet is a summary. It may not cover all possible information. If you have questions about this medicine, talk to your doctor, pharmacist, or health care provider.  2021 Elsevier/Gold Standard (2016-07-02 20:21:10)

## 2020-08-24 NOTE — Progress Notes (Signed)
[place]   [phone] Fax:(336) [phone]   Clinic Follow up Note   Patient Care Team: Jose Persia, MD as PCP - Evalina Field, MD as Consulting Physician (Ophthalmology) Coralie Keens, MD as Consulting Physician (General Surgery) Truitt Merle, MD as Consulting Physician (Hematology) Thayer Headings, MD as Consulting Physician (Infectious Diseases) 08/24/2020  CHIEF COMPLAINT: Follow up IDA and h/o GI bleed   CURRENT THERAPY: IV iron and RBC as needed, started bevacizumab q2 weeks x4 on 08/10/20  INTERVAL HISTORY: [name] returns for follow up and treatment as scheduled. She received first Avastin 08/10/20. Labs 3/23 showed Hg 12.3 and ferritin >400.  She is doing well, no changes since last visit.  She still has no energy.  She is eating but not drinking much because she gets full on Glucerna.  Denies bloody or black stool or other bleeding.  Denies N/V, signs of thrombosis, pain, fever, dyspnea, or other concerns.   MEDICAL HISTORY:  Past Medical History:  Diagnosis Date  . Allergic rhinitis   . Anemia, iron deficiency 05/03/2011   2/2 GI AVMs - recieves iron infusions as well as periodic red blood cell transfusions  . Anxiety   . Arteriovenous malformation of duodenum 02/25/2015  . CARPAL TUNNEL RELEASE, HX OF 07/22/2008  . Chest pain 10/28/2019  . Colon polyps   . Diabetes mellitus without complication ([place])    type 2  . Difficulty sleeping    takes trazadone for sleep  . Diverticulosis   . Fall 01/03/2020  . GERD (gastroesophageal reflux disease)   . Hepatitis C    in SVR with Harvoni 12/2014-02/2015.   Marland Kitchen History of hernia repair 08/18/2012  . History of porphyria 11/30/2014   Porphyria cutanea tarda in setting of chronic Hepatitis C, frequent  IV iron infusions, and alcohol abuse   . Hypertension   . Hypocalcemia 10/28/2019  . Hypokalemia 09/27/2014  . Peripheral vascular disease ([place])   . Stroke [facility]) 2010   no residual deficits   . Substance use disorder    cocaine  . Tachycardia 07/10/2017  . Trichomonas vaginitis 04/16/2019    SURGICAL HISTORY: Past Surgical History:  Procedure Laterality Date  . ABDOMINAL HYSTERECTOMY    . APPLICATION OF WOUND VAC N/A 06/21/2014   Procedure: APPLICATION OF WOUND VAC;  Surgeon: Coralie Keens, MD;  Location: [place];  Service: General;  Laterality: N/A;  . CARPAL TUNNEL RELEASE     rt hand  . COLONOSCOPY WITH PROPOFOL N/A 03/17/2018   Procedure: COLONOSCOPY WITH PROPOFOL;  Surgeon: Doran Stabler, MD;  Location: [facility];  Service: Gastroenterology;  Laterality: N/A;  . ENTEROSCOPY N/A 12/04/2012   Procedure: ENTEROSCOPY;  Surgeon: Beryle Beams, MD;  Location: [facility];  Service: Endoscopy;  Laterality: N/A;  . ENTEROSCOPY N/A 12/18/2012   Procedure: ENTEROSCOPY;  Surgeon: Beryle Beams, MD;  Location: [facility];  Service: Endoscopy;  Laterality: N/A;  . ENTEROSCOPY N/A 02/25/2015   Procedure: ENTEROSCOPY;  Surgeon: Inda Castle, MD;  Location: [facility];  Service: Endoscopy;  Laterality: N/A;  . ENTEROSCOPY N/A 10/29/2017   Procedure: ENTEROSCOPY;  Surgeon: Doran Stabler, MD;  [facility];  Service: Gastroenterology;  Laterality: N/A;  . ENTEROSCOPY N/A 07/08/2020   Procedure: ENTEROSCOPY;  Surgeon: Milus Banister, MD;  Location: [facility];  Service: Endoscopy;  Laterality: N/A;  . ESOPHAGOGASTRODUODENOSCOPY  05/05/2011   Procedure: ESOPHAGOGASTRODUODENOSCOPY (EGD);  Surgeon: Zenovia Jarred, MD;  Location[facility];  Service:  Gastroenterology;  Laterality: N/A;  Dr. Hilarie Fredrickson will do procedure for Dr. Benson Norway Saturday.  . ESOPHAGOGASTRODUODENOSCOPY  05/08/2011   Procedure: ESOPHAGOGASTRODUODENOSCOPY (EGD);  Surgeon: Beryle Beams;  [facility];  Service: Endoscopy;  Laterality: N/A;  . ESOPHAGOGASTRODUODENOSCOPY  06/07/2011   Procedure: ESOPHAGOGASTRODUODENOSCOPY (EGD);  Surgeon: Beryle Beams, MD;  Location[facility];   Service: Endoscopy;  Laterality: N/A;  . ESOPHAGOGASTRODUODENOSCOPY  12/20/2011   Procedure: ESOPHAGOGASTRODUODENOSCOPY (EGD);  Surgeon: Beryle Beams, MD;  Location: [facility];  Service: Endoscopy;  Laterality: N/A;  . EYE SURGERY Bilateral    cataracts  . FLEXIBLE SIGMOIDOSCOPY  12/21/2011   Procedure: FLEXIBLE SIGMOIDOSCOPY;  Surgeon: Beryle Beams, MD;  [facility];  Service: Endoscopy;  Laterality: N/A;  . HOT HEMOSTASIS  06/07/2011   Procedure: HOT HEMOSTASIS (ARGON PLASMA COAGULATION/BICAP);  Surgeon: Beryle Beams, MD;  Location[facility];  Service: Endoscopy;  Laterality: N/A;  . HOT HEMOSTASIS N/A 07/08/2020   Procedure: HOT HEMOSTASIS (ARGON PLASMA COAGULATION/BICAP);  Surgeon: Milus Banister, MD;  Location[facility];  Service: Endoscopy;  Laterality: N/A;  . INCISIONAL HERNIA REPAIR N/A 06/01/2014   Procedure: ATTEMPTED LAPAROSCOPIC AND OPEN INCISIONAL HERNIA REPAIR WITH MESH;  Surgeon: Coralie Keens, MD;  Location: [place];  Service: General;  Laterality: N/A;  . INSERTION OF MESH N/A 06/01/2014   Procedure: INSERTION OF MESH;  Surgeon: Coralie Keens, MD;  Location: [place];  Service: General;  Laterality: N/A;  . LAPAROTOMY  02/16/2012   Procedure: EXPLORATORY LAPAROTOMY;  Surgeon: Edward Jolly, MD;  [facility];  Service: General;  Laterality: N/A;  oversewing of anastomotic leak and rigid sigmoidoscopy  . LAPAROTOMY N/A 06/21/2014   Procedure: ABDOMINAL WOUND EXPLORATION;  Surgeon: Coralie Keens, MD;  Location: [place];  Service: General;  Laterality: N/A;  . LEG AMPUTATION ABOVE KNEE     left  . PARTIAL COLECTOMY  02/15/2012   Procedure: PARTIAL COLECTOMY;  Surgeon: Harl Bowie, MD;  Location: [facility];  Service: General;  Laterality: N/A;  . SUBMUCOSAL INJECTION  07/08/2020   Procedure: SUBMUCOSAL INJECTION;  Surgeon: Milus Banister, MD;  [facility];  Service: Endoscopy;;  . TONSILLECTOMY      I have reviewed the social  history and family history with the patient and they are unchanged from previous note.  ALLERGIES:  is allergic to ciprofloxacin, morphine and related, penicillins, codeine, lisinopril, and feraheme [ferumoxytol].  MEDICATIONS:  Current Outpatient Medications  Medication Sig Dispense Refill  . ACCU-CHEK FASTCLIX LANCETS MISC Use to test blood glucose 3 times daily. 100 each 11  . acetaminophen (TYLENOL) 500 MG tablet Take 1,000 mg by mouth every 6 (six) hours as needed.    Marland Kitchen allopurinol (ZYLOPRIM) 100 MG tablet TAKE 1 TABLET (100 MG TOTAL) BY MOUTH DAILY. 30 tablet 5  . amLODipine (NORVASC) 10 MG tablet Take 1 tablet (10 mg total) by mouth daily. 90 tablet 3  . aspirin EC 81 MG tablet Take 1 tablet (81 mg total) by mouth daily. 30 tablet 1  . Blood Glucose Monitoring Suppl (ACCU-CHEK GUIDE) w/Device KIT 1 each by Does not apply route 3 (three) times daily. The patient is insulin requiring, ICD 10 code E11.65. The patient tests 3 times a day. 1 kit 1  . cetirizine (ZYRTEC) 10 MG tablet Take 1 tablet (10 mg total) by mouth daily. 30 tablet 0  . cholecalciferol (VITAMIN D) 1000 units tablet Take 1 tablet (1,000 Units total) by mouth daily. 100 tablet 2  .  colchicine 0.6 MG tablet Take 1 tablet (0.6 mg total) by mouth as needed (gout). 30 tablet 0  . diazepam (VALIUM) 5 MG tablet Take 1 tablet (5 mg total) by mouth at bedtime as needed for anxiety. 4 tablet 0  . Dulaglutide (TRULICITY) 2.09 OB/0.9GG SOPN Inject 0.75 mg into the skin once a week. 6 mL 3  . feeding supplement, GLUCERNA SHAKE, (GLUCERNA SHAKE) LIQD Take 237 mLs by mouth 3 (three) times daily between meals. (Patient taking differently: Take 237 mLs by mouth 2 (two) times daily between meals.) 90 Can 0  . gabapentin (NEURONTIN) 400 MG capsule TAKE 1 TO 2 CAPSULES BY MOUTH 3 TIMES A DAY 180 capsule 0  . glucose blood (ACCU-CHEK GUIDE) test strip USE TO TEST BLOOD SUGAR THREE TIMES A DAY 100 strip 3  . losartan (COZAAR) 100 MG tablet Take  1 tablet (100 mg total) by mouth daily. 90 tablet 3  . metFORMIN (GLUCOPHAGE) 1000 MG tablet TAKE 1 TABLET BY MOUTH TWICE A DAY WITH A MEAL (Patient taking differently: Take 1,000 mg by mouth daily.) 90 tablet 6  . omeprazole (PRILOSEC) 20 MG capsule Take 1 capsule (20 mg total) by mouth daily. 90 capsule 1   No current facility-administered medications for this visit.    PHYSICAL EXAMINATION: ECOG PERFORMANCE STATUS: 1 - Symptomatic but completely ambulatory  Vitals:   08/24/20 0912  BP: 138/85  Pulse: 84  Resp: 18  Temp: 97.8 F (36.6 C)  SpO2: 100%   Filed Weights   08/24/20 0912  Weight: 158 lb 8 oz (71.9 kg)    GENERAL:alert, no distress and comfortable SKIN: No rash EYES: sclera clear LUNGS:  normal breathing effort HEART:  no right lower extremity edema NEURO: alert & oriented x 3 with fluent speech, no focal motor/sensory deficits  LABORATORY DATA:  I have reviewed the data as listed CBC Latest Ref Rng & Units 08/24/2020 08/17/2020 08/10/2020  WBC 4.0 - 10.5 K/uL 6.4 10.5 6.4  Hemoglobin 12.0 - 15.0 g/dL 13.3 12.8 10.8(L)  Hematocrit 36.0 - 46.0 % 40.4 38.7 33.8(L)  Platelets 150 - 400 K/uL 243 238 288     CMP Latest Ref Rng & Units 08/24/2020 07/10/2020 07/09/2020  Glucose 70 - 99 mg/dL 118(H) 133(H) 121(H)  BUN 8 - 23 mg/dL $Remove'22 17 18  'ScNPLum$ Creatinine 0.44 - 1.00 mg/dL 1.06(H) 1.10(H) 1.21(H)  Sodium 135 - 145 mmol/L 144 138 135  Potassium 3.5 - 5.1 mmol/L 4.4 3.5 3.7  Chloride 98 - 111 mmol/L 107 103 101  CO2 22 - 32 mmol/L $RemoveB'27 26 25  'hbJrbvQi$ Calcium 8.9 - 10.3 mg/dL 8.7(L) 9.1 8.8(L)  Total Protein 6.5 - 8.1 g/dL - - -  Total Bilirubin 0.3 - 1.2 mg/dL - - -  Alkaline Phos 38 - 126 U/L - - -  AST 15 - 41 U/L - - -  ALT 0 - 44 U/L - - -      RADIOGRAPHIC STUDIES: I have personally reviewed the radiological images as listed and agreed with the findings in the report. No results found.   ASSESSMENT & PLAN: Patricia Mahr Robinsonis a 70 y.o.femalewith    1. Iron  deficiency anemia secondary to GIbloodloss,andAnemia of Chronic Disease  -She has a longstanding history of anemia dating back at least 2008 Hg was 10, then periodically normal in 2010 until she presented with nadir Hg 4.4 on 02/22/2010.  Her various work-up showed normal reticulocyte in 04/2010, elevated B12/folate on 03/28/2012, normal haptoglobin on 01/02/2018, and normal  SPEP on 12/16/2019 except elevated light chains with normal ratio.  - History of multiple GI bleeding in 2016, required blood transfusion, hospitalization and IV Feraheme -She had an EGD and colonoscopy on 10/29/2017 and 03/17/2018 with Dr. Loletha Carrow, which was normal. Endoscopy in [facility] and was found to have AVM in small bowel.  -She has been on oral iron for 5-6 years now and has hadintermittentblack watery stool.  -She has been receiving IV Feraheme every1-82month on average lately.Goal is to keep her Hg >10and ferritin>100.Her last IV Feraheme wason 06/29/2020 -She developed a headache and increased her daily aspirin to 2 tabs 81 mg and presented with acute GI bleeding in 06/2020, hospitalized and received 1 blood transfusion. - Small bowel endoscopy Dr. Ardis Hughs on 07/09/19 showed non-bleedingAVM. This was cauterized.  CT was negative for occult malignancy. -08/03/20 no signs of recurrent GI bleeding, however Hgb remains low at 7.9 on 08/01/2020.  She received IV iron and 1u blood, Given that she is requiring frequent Iron and RBCs transfusions again (2 in last month, none since 2017), Dr. Burr Medico consented her to bevacizumab for GI bleeding which she started 08/10/20 -further work up 08/10/20 showed normal B12, folate (no nutritional cause) and normal LDH and haptoglobin (likely not hemolysis). Absolute retic was normal.  -Primary bone marrow disorder such as MDS has not been ruled out, we will hold on bone marrow biopsy for now and monitor CBC on Avastin and iron.  2. Porphyria cutaneoustarda (PCT), resolvedafter Hep C  Treatment  3.Chronic hepatitis C infection -She tested positive for hepatitis C in 2009. She has previously completed treatment for hepatitis C in 2016 and repeated test was negative.  -05/2018 liver US was unremarkable. -She gets 2 flu shots a year (March and October) and she received her COVID booster in 02/2020.  4. DM2, Trouble sleeping -Management per PCP's office. -Her PCP started her on Valium $RemoveB'5mg'MHghBUWY$  as needed to help her sleep. I recommend she not use this every night as it can lead to dependence.  5. LeftAKA, hasprosthesis, she is wheelchair bound most of time, Right LE Edema  -LE edemacould be related to nutritional deficiencies or decreased venous return. I encouraged her to elevateher right leg.  -On 12/30/19 she had a mechanical fall.For pain she has been on Oxycodone q6hours. Her left shoulder pain has improved but her left stump pain mostly stable. -She notes she has been usingcrutches and canemore lately and is working towards walking independently.She does not use power chair when she uses [employer].  Disposition: Ms. Heidel appears stable, remains fatigued.  She has no signs of recurrent GI or other bleeding.  S/p recent blood transfusion, IV iron, and starting bevacizumab on 3/16 her anemia has resolved, Hg 13.3 today. She is tolerating beva.   Labs reviewed, proceed with bevacizumab today as planned. Return for lab/beva in 2 and 4 weeks, follow up in 4 weeks. Will cancel lab 4/6 and 4/20 given she has responded well thus far and no need for RBC transfusion.   The plan was reviewed with Dr. Burr Medico.   Orders Placed This Encounter  Procedures  . Basic Metabolic Panel - [place] Only    Standing Status:   Future    Number of Occurrences:   1    Standing Expiration Date:   08/24/2021   All questions were answered. The patient knows to call the clinic with any problems, questions or concerns. No barriers to learning were detected.     Patricia Feeling,  NP 08/24/20

## 2020-08-24 NOTE — Progress Notes (Signed)
Nutrition Assessment   Reason for Assessment: MST   ASSESSMENT: 70 year old female with anemia of chronic disease receiving IV Feraheme, RBC, Bevacizumab q 2weeks x 4. She is followed by Dr. Burr Medico.  Past medical history of anemia, DM2, HTN, PVD, stroke, GI AVM's, L AKA, Right lower extremity edema.  Patient resting in infusion, easily awakes with name call. She reports not drinking enough water due to drinking Glucerna TID per MD order. Yesterday she drank 1 1/2 bottles of water, 3 Glucerna Shakes, a little cranberry juice, beef stew, sausage/egg biscuit, and cheese with souse meat. She denies nausea, vomiting, diarrhea. Patient endorses weight loss, recalls usual weight around 170 lbs.   Nutrition Focused Physical Exam: deferred    Medications: Vit D, Valium, Glucerna, Gabapentin, Metformin, Prilosec   Labs: Glucose 118, Cr 1.06   Anthropometrics: Weights today 158 lb 8 oz increased from 157 lb on 3/16. Weights are down 14 lbs from 172 lb 6.4 oz on 02/25/20; insignificant 8%   Height: 5'2" Weight: 158 lb 8 oz  UBW: 170 lbs (per pt) BMI: 28.99   NUTRITION DIAGNOSIS: Unintentional weight loss related to chronic illness (anemia of chronic disease) as evidenced by patient report of fatigue, insufficient water intake, 6 lb decrease in weight over the last month (4.2%), insignificant   INTERVENTION:  Educated on small frequent meals, high in protein Continue drinking Glucerna TID Recipes utilizing supplements provided Discussed good sources of iron Fact sheet provided Discussed strategies for increasing water intake     MONITORING, EVALUATION, GOAL: weight trends, oral intake   Next Visit: To be scheduled with infusion as needed

## 2020-08-25 ENCOUNTER — Telehealth: Payer: Self-pay | Admitting: Nurse Practitioner

## 2020-08-25 NOTE — Telephone Encounter (Signed)
Checked out appointment. No LOS notes needing to be scheduled. No changes made. 

## 2020-08-31 ENCOUNTER — Inpatient Hospital Stay: Payer: Medicare Other

## 2020-09-01 ENCOUNTER — Other Ambulatory Visit: Payer: Self-pay | Admitting: Internal Medicine

## 2020-09-01 DIAGNOSIS — G479 Sleep disorder, unspecified: Secondary | ICD-10-CM

## 2020-09-06 ENCOUNTER — Other Ambulatory Visit: Payer: Self-pay | Admitting: *Deleted

## 2020-09-06 MED ORDER — CETIRIZINE HCL 10 MG PO TABS: 10.0000 mg | ORAL_TABLET | Freq: Every day | ORAL | 0 refills | Status: DC

## 2020-09-07 ENCOUNTER — Telehealth: Payer: Self-pay | Admitting: Hematology

## 2020-09-07 ENCOUNTER — Inpatient Hospital Stay: Payer: Medicare Other

## 2020-09-07 NOTE — Telephone Encounter (Signed)
R/s appt per 4/13 sch msg. Called pt, no answer. Left msg with updated appts date and times.

## 2020-09-12 ENCOUNTER — Inpatient Hospital Stay: Payer: Medicare Other

## 2020-09-12 ENCOUNTER — Inpatient Hospital Stay: Payer: Medicare Other | Attending: Hematology

## 2020-09-12 ENCOUNTER — Other Ambulatory Visit: Payer: Self-pay

## 2020-09-12 VITALS — BP 144/84 | HR 82 | Temp 98.6°F | Resp 18

## 2020-09-12 DIAGNOSIS — Z7982 Long term (current) use of aspirin: Secondary | ICD-10-CM | POA: Insufficient documentation

## 2020-09-12 DIAGNOSIS — E118 Type 2 diabetes mellitus with unspecified complications: Secondary | ICD-10-CM | POA: Diagnosis not present

## 2020-09-12 DIAGNOSIS — E119 Type 2 diabetes mellitus without complications: Secondary | ICD-10-CM | POA: Diagnosis not present

## 2020-09-12 DIAGNOSIS — D500 Iron deficiency anemia secondary to blood loss (chronic): Secondary | ICD-10-CM

## 2020-09-12 DIAGNOSIS — R809 Proteinuria, unspecified: Secondary | ICD-10-CM | POA: Insufficient documentation

## 2020-09-12 DIAGNOSIS — Z89612 Acquired absence of left leg above knee: Secondary | ICD-10-CM | POA: Diagnosis not present

## 2020-09-12 DIAGNOSIS — Z79899 Other long term (current) drug therapy: Secondary | ICD-10-CM | POA: Diagnosis not present

## 2020-09-12 DIAGNOSIS — T8789 Other complications of amputation stump: Secondary | ICD-10-CM | POA: Diagnosis not present

## 2020-09-12 DIAGNOSIS — D509 Iron deficiency anemia, unspecified: Secondary | ICD-10-CM | POA: Diagnosis not present

## 2020-09-12 DIAGNOSIS — G4700 Insomnia, unspecified: Secondary | ICD-10-CM | POA: Diagnosis not present

## 2020-09-12 DIAGNOSIS — F1010 Alcohol abuse, uncomplicated: Secondary | ICD-10-CM | POA: Diagnosis not present

## 2020-09-12 DIAGNOSIS — Q2739 Arteriovenous malformation, other site: Secondary | ICD-10-CM | POA: Insufficient documentation

## 2020-09-12 DIAGNOSIS — K635 Polyp of colon: Secondary | ICD-10-CM | POA: Diagnosis not present

## 2020-09-12 DIAGNOSIS — Z8673 Personal history of transient ischemic attack (TIA), and cerebral infarction without residual deficits: Secondary | ICD-10-CM | POA: Insufficient documentation

## 2020-09-12 DIAGNOSIS — M25512 Pain in left shoulder: Secondary | ICD-10-CM | POA: Diagnosis not present

## 2020-09-12 DIAGNOSIS — B182 Chronic viral hepatitis C: Secondary | ICD-10-CM | POA: Insufficient documentation

## 2020-09-12 DIAGNOSIS — K219 Gastro-esophageal reflux disease without esophagitis: Secondary | ICD-10-CM | POA: Insufficient documentation

## 2020-09-12 DIAGNOSIS — R000 Tachycardia, unspecified: Secondary | ICD-10-CM | POA: Diagnosis not present

## 2020-09-12 DIAGNOSIS — I739 Peripheral vascular disease, unspecified: Secondary | ICD-10-CM | POA: Insufficient documentation

## 2020-09-12 DIAGNOSIS — I10 Essential (primary) hypertension: Secondary | ICD-10-CM | POA: Diagnosis not present

## 2020-09-12 DIAGNOSIS — E1136 Type 2 diabetes mellitus with diabetic cataract: Secondary | ICD-10-CM | POA: Insufficient documentation

## 2020-09-12 DIAGNOSIS — K922 Gastrointestinal hemorrhage, unspecified: Secondary | ICD-10-CM

## 2020-09-12 DIAGNOSIS — Z8619 Personal history of other infectious and parasitic diseases: Secondary | ICD-10-CM

## 2020-09-12 LAB — IRON AND TIBC
Iron: 153 ug/dL — ABNORMAL HIGH (ref 41–142)
Saturation Ratios: 60 % — ABNORMAL HIGH (ref 21–57)
TIBC: 253 ug/dL (ref 236–444)
UIBC: 100 ug/dL — ABNORMAL LOW (ref 120–384)

## 2020-09-12 LAB — CBC WITH DIFFERENTIAL (CANCER CENTER ONLY)
Abs Immature Granulocytes: 0.04 10*3/uL (ref 0.00–0.07)
Basophils Absolute: 0 10*3/uL (ref 0.0–0.1)
Basophils Relative: 0 %
Eosinophils Absolute: 0.1 10*3/uL (ref 0.0–0.5)
Eosinophils Relative: 1 %
HCT: 41.8 % (ref 36.0–46.0)
Hemoglobin: 14.3 g/dL (ref 12.0–15.0)
Immature Granulocytes: 1 %
Lymphocytes Relative: 10 %
Lymphs Abs: 0.8 10*3/uL (ref 0.7–4.0)
MCH: 32.9 pg (ref 26.0–34.0)
MCHC: 34.2 g/dL (ref 30.0–36.0)
MCV: 96.3 fL (ref 80.0–100.0)
Monocytes Absolute: 0.7 10*3/uL (ref 0.1–1.0)
Monocytes Relative: 8 %
Neutro Abs: 7 10*3/uL (ref 1.7–7.7)
Neutrophils Relative %: 80 %
Platelet Count: 246 10*3/uL (ref 150–400)
RBC: 4.34 MIL/uL (ref 3.87–5.11)
RDW: 13.2 % (ref 11.5–15.5)
WBC Count: 8.7 10*3/uL (ref 4.0–10.5)
nRBC: 0 % (ref 0.0–0.2)

## 2020-09-12 LAB — TOTAL PROTEIN, URINE DIPSTICK: Protein, ur: 300 mg/dL — AB

## 2020-09-12 LAB — FERRITIN: Ferritin: 454 ng/mL — ABNORMAL HIGH (ref 11–307)

## 2020-09-12 MED ORDER — DIPHENHYDRAMINE HCL 50 MG/ML IJ SOLN
50.0000 mg | Freq: Once | INTRAMUSCULAR | Status: AC
  Administered 2020-09-12: 50 mg via INTRAVENOUS

## 2020-09-12 MED ORDER — SODIUM CHLORIDE 0.9 % IV SOLN
INTRAVENOUS | Status: DC
  Filled 2020-09-12: qty 250

## 2020-09-12 MED ORDER — FAMOTIDINE 20 MG PO TABS
20.0000 mg | ORAL_TABLET | Freq: Once | ORAL | Status: AC
  Administered 2020-09-12: 20 mg via ORAL

## 2020-09-12 MED ORDER — SODIUM CHLORIDE 0.9 % IV SOLN
510.0000 mg | Freq: Once | INTRAVENOUS | Status: AC
  Administered 2020-09-12: 510 mg via INTRAVENOUS
  Filled 2020-09-12: qty 510

## 2020-09-12 MED ORDER — DIPHENHYDRAMINE HCL 50 MG/ML IJ SOLN
INTRAMUSCULAR | Status: AC
  Filled 2020-09-12: qty 1

## 2020-09-12 MED ORDER — FAMOTIDINE 20 MG PO TABS
ORAL_TABLET | ORAL | Status: AC
  Filled 2020-09-12: qty 1

## 2020-09-12 NOTE — Progress Notes (Signed)
Per Dr. Alvy Bimler no bevacizumab treatment today due to urine protein level

## 2020-09-12 NOTE — Patient Instructions (Signed)
Ferumoxytol injection What is this medicine? FERUMOXYTOL is an iron complex. Iron is used to make healthy red blood cells, which carry oxygen and nutrients throughout the body. This medicine is used to treat iron deficiency anemia. This medicine may be used for other purposes; ask your health care provider or pharmacist if you have questions. COMMON BRAND NAME(S): Feraheme What should I tell my health care provider before I take this medicine? They need to know if you have any of these conditions:  anemia not caused by low iron levels  high levels of iron in the blood  magnetic resonance imaging (MRI) test scheduled  an unusual or allergic reaction to iron, other medicines, foods, dyes, or preservatives  pregnant or trying to get pregnant  breast-feeding How should I use this medicine? This medicine is for injection into a vein. It is given by a health care professional in a hospital or clinic setting. Talk to your pediatrician regarding the use of this medicine in children. Special care may be needed. Overdosage: If you think you have taken too much of this medicine contact a poison control center or emergency room at once. NOTE: This medicine is only for you. Do not share this medicine with others. What if I miss a dose? It is important not to miss your dose. Call your doctor or health care professional if you are unable to keep an appointment. What may interact with this medicine? This medicine may interact with the following medications:  other iron products This list may not describe all possible interactions. Give your health care provider a list of all the medicines, herbs, non-prescription drugs, or dietary supplements you use. Also tell them if you smoke, drink alcohol, or use illegal drugs. Some items may interact with your medicine. What should I watch for while using this medicine? Visit your doctor or healthcare professional regularly. Tell your doctor or healthcare  professional if your symptoms do not start to get better or if they get worse. You may need blood work done while you are taking this medicine. You may need to follow a special diet. Talk to your doctor. Foods that contain iron include: whole grains/cereals, dried fruits, beans, or peas, leafy green vegetables, and organ meats (liver, kidney). What side effects may I notice from receiving this medicine? Side effects that you should report to your doctor or health care professional as soon as possible:  allergic reactions like skin rash, itching or hives, swelling of the face, lips, or tongue  breathing problems  changes in blood pressure  feeling faint or lightheaded, falls  fever or chills  flushing, sweating, or hot feelings  swelling of the ankles or feet Side effects that usually do not require medical attention (report to your doctor or health care professional if they continue or are bothersome):  diarrhea  headache  nausea, vomiting  stomach pain This list may not describe all possible side effects. Call your doctor for medical advice about side effects. You may report side effects to FDA at 1-800-FDA-1088. Where should I keep my medicine? This drug is given in a hospital or clinic and will not be stored at home. NOTE: This sheet is a summary. It may not cover all possible information. If you have questions about this medicine, talk to your doctor, pharmacist, or health care provider.  2021 Elsevier/Gold Standard (2016-07-02 20:21:10)  

## 2020-09-13 LAB — AFP TUMOR MARKER: AFP, Serum, Tumor Marker: 2.4 ng/mL (ref 0.0–9.2)

## 2020-09-14 ENCOUNTER — Other Ambulatory Visit: Payer: Medicare Other

## 2020-09-16 NOTE — Progress Notes (Signed)
[place]   [phone]) [phone] [phone]   Clinic Follow up Note   Patient Care Team: Jose Persia, MD as PCP - Evalina Field, MD as Consulting Physician (Ophthalmology) Coralie Keens, MD as Consulting Physician (General Surgery) Truitt Merle, MD as Consulting Physician (Hematology) Comer, Okey Regal, MD as Consulting Physician (Infectious Diseases)  Date of Service:  09/21/2020  CHIEF COMPLAINT: Anemia   CURRENT THERAPY:  IV iron and blood transfusions as needed Bevacizumab Noah Charon) starting 08/10/20. Last dose 08/24/20  INTERVAL HISTORY:  [name] is here for a follow up of anemia. She was last seen by me 2 months ago and seen by NP Lacie in interim, last on 08/24/20. She presents to the clinic alone. She notes she tolerated Zirabev well.  She notes she is on HTN meds.  She notes she is happier now that she has a new relationship.   REVIEW OF SYSTEMS:   Constitutional: Denies fevers, chills or abnormal weight loss Eyes: Denies blurriness of vision Ears, nose, mouth, throat, and face: Denies mucositis or sore throat Respiratory: Denies cough, dyspnea or wheezes Cardiovascular: Denies palpitation, chest discomfort or lower extremity swelling Gastrointestinal:  Denies nausea, heartburn or change in bowel habits Skin: Denies abnormal skin rashes Lymphatics: Denies new lymphadenopathy or easy bruising Neurological:Denies numbness, tingling or new weaknesses Behavioral/Psych: Mood is stable, no new changes  All other systems were reviewed with the patient and are negative.  MEDICAL HISTORY:  Past Medical History:  Diagnosis Date  . Allergic rhinitis   . Anemia, iron deficiency 05/03/2011   2/2 GI AVMs - recieves iron infusions as well as periodic red blood cell transfusions  . Anxiety   . Arteriovenous malformation of duodenum 02/25/2015  . CARPAL TUNNEL RELEASE, HX OF 07/22/2008  . Chest pain 10/28/2019  . Colon polyps   .  Diabetes mellitus without complication ([place])    type 2  . Difficulty sleeping    takes trazadone for sleep  . Diverticulosis   . Fall 01/03/2020  . GERD (gastroesophageal reflux disease)   . Hepatitis C    in SVR with Harvoni 12/2014-02/2015.   Marland Kitchen History of hernia repair 08/18/2012  . History of porphyria 11/30/2014   Porphyria cutanea tarda in setting of chronic Hepatitis C, frequent  IV iron infusions, and alcohol abuse   . Hypertension   . Hypocalcemia 10/28/2019  . Hypokalemia 09/27/2014  . Peripheral vascular disease ([place])   . Stroke [facility]) 2010   no residual deficits  . Substance use disorder    cocaine  . Tachycardia 07/10/2017  . Trichomonas vaginitis 04/16/2019    SURGICAL HISTORY: Past Surgical History:  Procedure Laterality Date  . ABDOMINAL HYSTERECTOMY    . APPLICATION OF WOUND VAC N/A 06/21/2014   Procedure: APPLICATION OF WOUND VAC;  Surgeon: Coralie Keens, MD;  Location: [place];  Service: General;  Laterality: N/A;  . CARPAL TUNNEL RELEASE     rt hand  . COLONOSCOPY WITH PROPOFOL N/A 03/17/2018   Procedure: COLONOSCOPY WITH PROPOFOL;  Surgeon: Doran Stabler, MD;  Location: [facility];  Service: Gastroenterology;  Laterality: N/A;  . ENTEROSCOPY N/A 12/04/2012   Procedure: ENTEROSCOPY;  Surgeon: Beryle Beams, MD;  Location: [facility];  Service: Endoscopy;  Laterality: N/A;  . ENTEROSCOPY N/A 12/18/2012   Procedure: ENTEROSCOPY;  Surgeon: Beryle Beams, MD;  [facility];  Service: Endoscopy;  Laterality: N/A;  . ENTEROSCOPY N/A 02/25/2015   Procedure: ENTEROSCOPY;  Surgeon: Inda Castle,  MD;  Location: [facility];  Service: Endoscopy;  Laterality: N/A;  . ENTEROSCOPY N/A 10/29/2017   Procedure: ENTEROSCOPY;  Surgeon: Doran Stabler, MD;  [facility];  Service: Gastroenterology;  Laterality: N/A;  . ENTEROSCOPY N/A 07/08/2020   Procedure: ENTEROSCOPY;  Surgeon: Milus Banister, MD;  [facility];  Service: Endoscopy;   Laterality: N/A;  . ESOPHAGOGASTRODUODENOSCOPY  05/05/2011   Procedure: ESOPHAGOGASTRODUODENOSCOPY (EGD);  Surgeon: Zenovia Jarred, MD;  Location[facility];  Service: Gastroenterology;  Laterality: N/A;  Dr. Hilarie Fredrickson will do procedure for Dr. Benson Norway Saturday.  . ESOPHAGOGASTRODUODENOSCOPY  05/08/2011   Procedure: ESOPHAGOGASTRODUODENOSCOPY (EGD);  Surgeon: Beryle Beams;  [facility];  Service: Endoscopy;  Laterality: N/A;  . ESOPHAGOGASTRODUODENOSCOPY  06/07/2011   Procedure: ESOPHAGOGASTRODUODENOSCOPY (EGD);  Surgeon: Beryle Beams, MD;  [facility];  Service: Endoscopy;  Laterality: N/A;  . ESOPHAGOGASTRODUODENOSCOPY  12/20/2011   Procedure: ESOPHAGOGASTRODUODENOSCOPY (EGD);  Surgeon: Beryle Beams, MD;  Location: [facility];  Service: Endoscopy;  Laterality: N/A;  . EYE SURGERY Bilateral    cataracts  . FLEXIBLE SIGMOIDOSCOPY  12/21/2011   Procedure: FLEXIBLE SIGMOIDOSCOPY;  Surgeon: Beryle Beams, MD;  [facility];  Service: Endoscopy;  Laterality: N/A;  . HOT HEMOSTASIS  06/07/2011   Procedure: HOT HEMOSTASIS (ARGON PLASMA COAGULATION/BICAP);  Surgeon: Beryle Beams, MD;  [facility];  Service: Endoscopy;  Laterality: N/A;  . HOT HEMOSTASIS N/A 07/08/2020   Procedure: HOT HEMOSTASIS (ARGON PLASMA COAGULATION/BICAP);  Surgeon: Milus Banister, MD;  Location[facility];  Service: Endoscopy;  Laterality: N/A;  . INCISIONAL HERNIA REPAIR N/A 06/01/2014   Procedure: ATTEMPTED LAPAROSCOPIC AND OPEN INCISIONAL HERNIA REPAIR WITH MESH;  Surgeon: Coralie Keens, MD;  Location: [place];  Service: General;  Laterality: N/A;  . INSERTION OF MESH N/A 06/01/2014   Procedure: INSERTION OF MESH;  Surgeon: Coralie Keens, MD;  Location: [place];  Service: General;  Laterality: N/A;  . LAPAROTOMY  02/16/2012   Procedure: EXPLORATORY LAPAROTOMY;  Surgeon: Edward Jolly, MD;  [facility];  Service: General;  Laterality: N/A;  oversewing of anastomotic leak  and rigid sigmoidoscopy  . LAPAROTOMY N/A 06/21/2014   Procedure: ABDOMINAL WOUND EXPLORATION;  Surgeon: Coralie Keens, MD;  Location: [place];  Service: General;  Laterality: N/A;  . LEG AMPUTATION ABOVE KNEE     left  . PARTIAL COLECTOMY  02/15/2012   Procedure: PARTIAL COLECTOMY;  Surgeon: Harl Bowie, MD;  [facility]: [facility];  Service: General;  Laterality: N/A;  . SUBMUCOSAL INJECTION  07/08/2020   Procedure: SUBMUCOSAL INJECTION;  Surgeon: Milus Banister, MD;  [facility];  Service: Endoscopy;;  . TONSILLECTOMY      I have reviewed the social history and family history with the patient and they are unchanged from previous note.  ALLERGIES:  is allergic to ciprofloxacin, morphine and related, penicillins, codeine, lisinopril, and feraheme [ferumoxytol].  MEDICATIONS:  Current Outpatient Medications  Medication Sig Dispense Refill  . ACCU-CHEK FASTCLIX LANCETS MISC Use to test blood glucose 3 times daily. 100 each 11  . acetaminophen (TYLENOL) 500 MG tablet Take 1,000 mg by mouth every 6 (six) hours as needed.    Marland Kitchen allopurinol (ZYLOPRIM) 100 MG tablet TAKE 1 TABLET (100 MG TOTAL) BY MOUTH DAILY. 30 tablet 5  . amLODipine (NORVASC) 10 MG tablet Take 1 tablet (10 mg total) by mouth daily. 90 tablet 3  . aspirin EC 81 MG tablet Take 1 tablet (81 mg total) by mouth  daily. 30 tablet 1  . Blood Glucose Monitoring Suppl (ACCU-CHEK GUIDE) w/Device KIT 1 each by Does not apply route 3 (three) times daily. The patient is insulin requiring, ICD 10 code E11.65. The patient tests 3 times a day. 1 kit 1  . cetirizine (ZYRTEC) 10 MG tablet Take 1 tablet (10 mg total) by mouth daily. 30 tablet 0  . cholecalciferol (VITAMIN D) 1000 units tablet Take 1 tablet (1,000 Units total) by mouth daily. 100 tablet 2  . colchicine 0.6 MG tablet Take 1 tablet (0.6 mg total) by mouth as needed (gout). 30 tablet 0  . diazepam (VALIUM) 5 MG tablet TAKE 1 TABLET (5 MG TOTAL) BY MOUTH AT BEDTIME AS  NEEDED FOR ANXIETY. 4 tablet 0  . Dulaglutide 0.75 MG/0.5ML SOPN INJECT 0.75 MG INTO THE SKIN ONCE A WEEK. 6 mL 3  . feeding supplement, GLUCERNA SHAKE, (GLUCERNA SHAKE) LIQD Take 237 mLs by mouth 3 (three) times daily between meals. (Patient taking differently: Take 237 mLs by mouth 2 (two) times daily between meals.) 90 Can 0  . gabapentin (NEURONTIN) 400 MG capsule TAKE 1 TO 2 CAPSULES BY MOUTH 3 TIMES A DAY 180 capsule 0  . glucose blood (ACCU-CHEK GUIDE) test strip USE TO TEST BLOOD SUGAR THREE TIMES A DAY 100 strip 3  . losartan (COZAAR) 100 MG tablet TAKE 1 TABLET (100 MG TOTAL) BY MOUTH DAILY. 90 tablet 3  . metFORMIN (GLUCOPHAGE) 1000 MG tablet TAKE 1 TABLET BY MOUTH TWICE A DAY WITH A MEAL (Patient taking differently: Take 1,000 mg by mouth daily.) 90 tablet 6  . omeprazole (PRILOSEC) 20 MG capsule Take 1 capsule (20 mg total) by mouth daily. 90 capsule 1   No current facility-administered medications for this visit.    PHYSICAL EXAMINATION: ECOG PERFORMANCE STATUS: 1 - Symptomatic but completely ambulatory  Vitals:   09/21/20 0837  BP: (!) 158/87  Pulse: 80  Resp: 18  Temp: (!) 97 F (36.1 C)  SpO2: 100%   Filed Weights   09/21/20 0837  Weight: 160 lb 12.8 oz (72.9 kg)    Due to COVID19 we will limit examination to appearance. Patient had no complaints.  GENERAL:alert, no distress and comfortable SKIN: skin color normal, no rashes or significant lesions EYES: normal, Conjunctiva are pink and non-injected, sclera clear  NEURO: alert & oriented x 3 with fluent speech  LABORATORY DATA:  I have reviewed the data as listed CBC Latest Ref Rng & Units 09/21/2020 09/12/2020 08/24/2020  WBC 4.0 - 10.5 K/uL 8.6 8.7 6.4  Hemoglobin 12.0 - 15.0 g/dL 14.0 14.3 13.3  Hematocrit 36.0 - 46.0 % 41.2 41.8 40.4  Platelets 150 - 400 K/uL 256 246 243     CMP Latest Ref Rng & Units 08/24/2020 07/10/2020 07/09/2020  Glucose 70 - 99 mg/dL 118(H) 133(H) 121(H)  BUN 8 - 23 mg/dL $Remove'22 17 18   'FpFZaCI$ Creatinine 0.44 - 1.00 mg/dL 1.06(H) 1.10(H) 1.21(H)  Sodium 135 - 145 mmol/L 144 138 135  Potassium 3.5 - 5.1 mmol/L 4.4 3.5 3.7  Chloride 98 - 111 mmol/L 107 103 101  CO2 22 - 32 mmol/L $RemoveB'27 26 25  'Kdcqtgld$ Calcium 8.9 - 10.3 mg/dL 8.7(L) 9.1 8.8(L)  Total Protein 6.5 - 8.1 g/dL - - -  Total Bilirubin 0.3 - 1.2 mg/dL - - -  Alkaline Phos 38 - 126 U/L - - -  AST 15 - 41 U/L - - -  ALT 0 - 44 U/L - - -  RADIOGRAPHIC STUDIES: I have personally reviewed the radiological images as listed and agreed with the findings in the report. No results found.   ASSESSMENT & PLAN:  [name] is a 70 y.o. female with    1. Iron deficiency anemia secondary to GIbloodloss,andAnemia of Chronic Disease  -History of multiple GI bleeding in 2016, required blood transfusion, hospitalization and IV Feraheme -She had an EGD and colonoscopy on 10/29/2017 and 03/17/2018 with Dr. Loletha Carrow, which was normal.  -She endoscopy in [facility] and was found to have AVM in small bowel.  -She has been on oral iron for 5-6 years now and has hadintermittentblack watery stool.  -She has been receiving IV Feraheme every1-20month on average lately.Goal is to keep her Hg >10and ferritin>100.Her last IV Feraheme wason 06/29/2020 -After recent melena, she required hospitalization and blood transfusion in 06/2020. Dr Ardis Hughs did upper endoscopy on 07/09/19 and was seen to have non-bleeding AVM. This was cauterized.  -She denies any more melena, her anemia worsened. I started her on bevacizumab on 08/10/20. Last dose given 08/24/20. She continues IV Feraheme as needed.  -Labs reviewed, anemia has resolved since start of beva. She is responding very well. Ferritin is still pending, if this is low, will give IV Feraheme today. Protein urine is elevated, will not give Beva today.  -She has tolerated beva well, but has increased BP and proteinuria, which are side effects from beva. I discussed monitoring her with close labs. Will  re-evaluate in 9 weeks.  -Given resolved anemia, and side effect from bevacizumab, I will hold treatment today, and restart bevacizumab if she develops GI bleeding again -Will monitor with labs every 3 weeks and f/u in 9 weeks.    2. Porphyria cutaneoustarda (PCT), resolvedafter Hep C Treatment  3.Chronic hepatitis C infection -She tested positive for hepatitis C in 2009. She has previously completed treatment for hepatitis C in 2016 and repeated test was negative.  -She had an EGD and colonoscopy on 10/29/2017 and 03/17/2018 with Dr. Loletha Carrow, which was normal. Dorothyann Gibbs continue to follow up with him. -05/2018 liver US was unremarkable. -She gets 2 flu shots a year (March and October) and she received her COVID booster in 02/2020.  4. DM2, Trouble sleeping, HTN   -Management per PCP's office. -Her PCP started her on Valium $RemoveB'5mg'PioCSLhX$  as needed to help her sleep. I recommend she not use this every night as it can lead to dependence.  -She has baseline HTN. On amlodipine, losartan. I discussed her BP can elevate on Bevacizumab. Given she responded well and does not need often, will monitor her BP at home before adding another HTN medication.   5. LeftAKA, hasprosthesis, she is wheelchair bound most of time, Right LE Edema  -LE edemacould be related to nutritional deficiencies or decreased venous return. I encouraged her to elevateher right leg.  -On 12/30/19 she had a mechanical fall. For pain she has been on Oxycodone q6hours. Her left shoulder pain has improved but her left stump pain mostly stable. -She notes she has been using crutches and cane more lately and is working towards walking independently.She does not use power chair when she uses [employer].  Plan -Labs reviewed, due to severe proteinuria and hypertension, no beva today.  -Lab every 3 weeks X3, will give IV Feraheme if Ferritin low  -F/u and bevacizumab in 9 weeks    No problem-specific Assessment & Plan notes found for  this encounter.   No orders of the defined types were placed in  this encounter.  All questions were answered. The patient knows to call the clinic with any problems, questions or concerns. No barriers to learning was detected. The total time spent in the appointment was 30 minutes.     Truitt Merle, MD 09/21/2020   I, Joslyn Devon, am acting as scribe for Truitt Merle, MD.   I have reviewed the above documentation for accuracy and completeness, and I agree with the above.

## 2020-09-20 ENCOUNTER — Other Ambulatory Visit (HOSPITAL_COMMUNITY): Payer: Self-pay

## 2020-09-20 MED FILL — Dulaglutide Soln Auto-injector 0.75 MG/0.5ML: SUBCUTANEOUS | 84 days supply | Qty: 6 | Fill #0 | Status: AC

## 2020-09-21 ENCOUNTER — Inpatient Hospital Stay: Payer: Medicare Other

## 2020-09-21 ENCOUNTER — Telehealth: Payer: Self-pay | Admitting: Hematology

## 2020-09-21 ENCOUNTER — Other Ambulatory Visit (HOSPITAL_COMMUNITY): Payer: Self-pay

## 2020-09-21 ENCOUNTER — Inpatient Hospital Stay: Payer: Medicare Other | Admitting: Dietician

## 2020-09-21 ENCOUNTER — Inpatient Hospital Stay (HOSPITAL_BASED_OUTPATIENT_CLINIC_OR_DEPARTMENT_OTHER): Payer: Medicare Other | Admitting: Hematology

## 2020-09-21 ENCOUNTER — Other Ambulatory Visit: Payer: Self-pay

## 2020-09-21 ENCOUNTER — Encounter: Payer: Self-pay | Admitting: Dietician

## 2020-09-21 ENCOUNTER — Encounter: Payer: Self-pay | Admitting: Hematology

## 2020-09-21 VITALS — BP 158/87 | HR 80 | Temp 97.0°F | Resp 18 | Ht 62.0 in | Wt 160.8 lb

## 2020-09-21 DIAGNOSIS — D509 Iron deficiency anemia, unspecified: Secondary | ICD-10-CM | POA: Diagnosis not present

## 2020-09-21 DIAGNOSIS — D500 Iron deficiency anemia secondary to blood loss (chronic): Secondary | ICD-10-CM

## 2020-09-21 LAB — CBC WITH DIFFERENTIAL (CANCER CENTER ONLY)
Abs Immature Granulocytes: 0.06 10*3/uL (ref 0.00–0.07)
Basophils Absolute: 0.1 10*3/uL (ref 0.0–0.1)
Basophils Relative: 1 %
Eosinophils Absolute: 0.2 10*3/uL (ref 0.0–0.5)
Eosinophils Relative: 2 %
HCT: 41.2 % (ref 36.0–46.0)
Hemoglobin: 14 g/dL (ref 12.0–15.0)
Immature Granulocytes: 1 %
Lymphocytes Relative: 13 %
Lymphs Abs: 1.1 10*3/uL (ref 0.7–4.0)
MCH: 32.6 pg (ref 26.0–34.0)
MCHC: 34 g/dL (ref 30.0–36.0)
MCV: 96 fL (ref 80.0–100.0)
Monocytes Absolute: 0.8 10*3/uL (ref 0.1–1.0)
Monocytes Relative: 10 %
Neutro Abs: 6.4 10*3/uL (ref 1.7–7.7)
Neutrophils Relative %: 73 %
Platelet Count: 256 10*3/uL (ref 150–400)
RBC: 4.29 MIL/uL (ref 3.87–5.11)
RDW: 13.2 % (ref 11.5–15.5)
WBC Count: 8.6 10*3/uL (ref 4.0–10.5)
nRBC: 0 % (ref 0.0–0.2)

## 2020-09-21 LAB — TOTAL PROTEIN, URINE DIPSTICK: Protein, ur: 300 mg/dL — AB

## 2020-09-21 LAB — FERRITIN: Ferritin: 623 ng/mL — ABNORMAL HIGH (ref 11–307)

## 2020-09-21 NOTE — Telephone Encounter (Signed)
Scheduled follow-up appointments per 4/27 los. Patient is aware. 

## 2020-09-21 NOTE — Progress Notes (Signed)
Nutrition  Patient scheduled for nutrition follow-up in infusion. Received message from scheduling that patient infusion cancelled today with request to change appointment to telephone or reschedule. Briefly met with patient in lobby while she was waiting on Uber. Patient requested RD to call this afternoon for nutrition follow-up. Patient reports enjoying recipes given at last nutrition visit and would like to ask a few questions about a few of the recipes. Attempted to contact patient x 2 this afternoon. Patient did not answer. No option to leave message for request for return call. Patient has been rescheduled for nutrition follow-up via telephone on Tuesday May 3.  Lajuan Lines, RD, [place] Cell [phone]

## 2020-09-27 ENCOUNTER — Telehealth: Payer: Self-pay | Admitting: Dietician

## 2020-09-27 ENCOUNTER — Inpatient Hospital Stay: Payer: Medicare Other | Attending: Hematology | Admitting: Dietician

## 2020-09-27 DIAGNOSIS — E119 Type 2 diabetes mellitus without complications: Secondary | ICD-10-CM | POA: Insufficient documentation

## 2020-09-27 DIAGNOSIS — K922 Gastrointestinal hemorrhage, unspecified: Secondary | ICD-10-CM | POA: Insufficient documentation

## 2020-09-27 DIAGNOSIS — D500 Iron deficiency anemia secondary to blood loss (chronic): Secondary | ICD-10-CM | POA: Insufficient documentation

## 2020-09-27 DIAGNOSIS — Z888 Allergy status to other drugs, medicaments and biological substances status: Secondary | ICD-10-CM | POA: Insufficient documentation

## 2020-09-27 DIAGNOSIS — Z79899 Other long term (current) drug therapy: Secondary | ICD-10-CM | POA: Insufficient documentation

## 2020-09-27 DIAGNOSIS — Z885 Allergy status to narcotic agent status: Secondary | ICD-10-CM | POA: Insufficient documentation

## 2020-09-27 DIAGNOSIS — I10 Essential (primary) hypertension: Secondary | ICD-10-CM | POA: Insufficient documentation

## 2020-09-27 DIAGNOSIS — Z881 Allergy status to other antibiotic agents status: Secondary | ICD-10-CM | POA: Insufficient documentation

## 2020-09-27 DIAGNOSIS — R109 Unspecified abdominal pain: Secondary | ICD-10-CM | POA: Insufficient documentation

## 2020-09-27 DIAGNOSIS — Z880 Allergy status to penicillin: Secondary | ICD-10-CM | POA: Insufficient documentation

## 2020-09-27 DIAGNOSIS — B182 Chronic viral hepatitis C: Secondary | ICD-10-CM | POA: Insufficient documentation

## 2020-09-27 NOTE — Telephone Encounter (Signed)
Nutrition   Patient rescheduled for nutrition follow-up via telephone after treatment plan change on 4/27. Patient did not answer. Unable to leave voicemail. Patient has contact information. Will reschedule as able.    Lajuan Lines, RD, [place] Cell [phone]

## 2020-10-12 ENCOUNTER — Inpatient Hospital Stay (HOSPITAL_BASED_OUTPATIENT_CLINIC_OR_DEPARTMENT_OTHER): Payer: Medicare Other | Admitting: Hematology

## 2020-10-12 ENCOUNTER — Other Ambulatory Visit: Payer: Self-pay

## 2020-10-12 ENCOUNTER — Inpatient Hospital Stay: Payer: Medicare Other

## 2020-10-12 ENCOUNTER — Telehealth: Payer: Self-pay

## 2020-10-12 ENCOUNTER — Emergency Department (HOSPITAL_COMMUNITY)
Admission: EM | Admit: 2020-10-12 | Discharge: 2020-10-12 | Discharge status: Against Medical Advice | Payer: Medicare Other | Source: Home / Self Care | Attending: Emergency Medicine | Admitting: Emergency Medicine

## 2020-10-12 ENCOUNTER — Encounter: Payer: Self-pay | Admitting: Hematology

## 2020-10-12 DIAGNOSIS — R109 Unspecified abdominal pain: Secondary | ICD-10-CM | POA: Insufficient documentation

## 2020-10-12 DIAGNOSIS — D500 Iron deficiency anemia secondary to blood loss (chronic): Secondary | ICD-10-CM | POA: Diagnosis present

## 2020-10-12 DIAGNOSIS — E119 Type 2 diabetes mellitus without complications: Secondary | ICD-10-CM | POA: Insufficient documentation

## 2020-10-12 DIAGNOSIS — I10 Essential (primary) hypertension: Secondary | ICD-10-CM | POA: Insufficient documentation

## 2020-10-12 DIAGNOSIS — R1013 Epigastric pain: Secondary | ICD-10-CM | POA: Diagnosis not present

## 2020-10-12 DIAGNOSIS — F17210 Nicotine dependence, cigarettes, uncomplicated: Secondary | ICD-10-CM | POA: Insufficient documentation

## 2020-10-12 DIAGNOSIS — Z880 Allergy status to penicillin: Secondary | ICD-10-CM | POA: Diagnosis not present

## 2020-10-12 DIAGNOSIS — Z885 Allergy status to narcotic agent status: Secondary | ICD-10-CM | POA: Diagnosis not present

## 2020-10-12 DIAGNOSIS — K922 Gastrointestinal hemorrhage, unspecified: Secondary | ICD-10-CM | POA: Diagnosis not present

## 2020-10-12 DIAGNOSIS — B182 Chronic viral hepatitis C: Secondary | ICD-10-CM | POA: Diagnosis not present

## 2020-10-12 DIAGNOSIS — Z7984 Long term (current) use of oral hypoglycemic drugs: Secondary | ICD-10-CM | POA: Insufficient documentation

## 2020-10-12 DIAGNOSIS — Z881 Allergy status to other antibiotic agents status: Secondary | ICD-10-CM | POA: Diagnosis not present

## 2020-10-12 DIAGNOSIS — Z888 Allergy status to other drugs, medicaments and biological substances status: Secondary | ICD-10-CM | POA: Diagnosis not present

## 2020-10-12 DIAGNOSIS — Z79899 Other long term (current) drug therapy: Secondary | ICD-10-CM | POA: Insufficient documentation

## 2020-10-12 DIAGNOSIS — Z7982 Long term (current) use of aspirin: Secondary | ICD-10-CM | POA: Insufficient documentation

## 2020-10-12 DIAGNOSIS — R0789 Other chest pain: Secondary | ICD-10-CM | POA: Insufficient documentation

## 2020-10-12 LAB — CBC WITH DIFFERENTIAL (CANCER CENTER ONLY)
Abs Immature Granulocytes: 0.05 10*3/uL (ref 0.00–0.07)
Basophils Absolute: 0 10*3/uL (ref 0.0–0.1)
Basophils Relative: 0 %
Eosinophils Absolute: 0.1 10*3/uL (ref 0.0–0.5)
Eosinophils Relative: 2 %
HCT: 41.7 % (ref 36.0–46.0)
Hemoglobin: 14.4 g/dL (ref 12.0–15.0)
Immature Granulocytes: 1 %
Lymphocytes Relative: 13 %
Lymphs Abs: 1.2 10*3/uL (ref 0.7–4.0)
MCH: 32 pg (ref 26.0–34.0)
MCHC: 34.5 g/dL (ref 30.0–36.0)
MCV: 92.7 fL (ref 80.0–100.0)
Monocytes Absolute: 0.8 10*3/uL (ref 0.1–1.0)
Monocytes Relative: 9 %
Neutro Abs: 6.5 10*3/uL (ref 1.7–7.7)
Neutrophils Relative %: 75 %
Platelet Count: 233 10*3/uL (ref 150–400)
RBC: 4.5 MIL/uL (ref 3.87–5.11)
RDW: 13.2 % (ref 11.5–15.5)
WBC Count: 8.6 10*3/uL (ref 4.0–10.5)
nRBC: 0 % (ref 0.0–0.2)

## 2020-10-12 LAB — URINALYSIS, DIPSTICK ONLY
Bilirubin Urine: NEGATIVE
Glucose, UA: 150 mg/dL — AB
Ketones, ur: NEGATIVE mg/dL
Leukocytes,Ua: NEGATIVE
Nitrite: NEGATIVE
Protein, ur: >=300 mg/dL — AB
Specific Gravity, Urine: 1.008 (ref 1.005–1.030)
pH: 5 (ref 5.0–8.0)

## 2020-10-12 LAB — IRON AND TIBC
Iron: 76 ug/dL (ref 41–142)
Saturation Ratios: 31 % (ref 21–57)
TIBC: 243 ug/dL (ref 236–444)
UIBC: 167 ug/dL (ref 120–384)

## 2020-10-12 LAB — FERRITIN: Ferritin: 459 ng/mL — ABNORMAL HIGH (ref 11–307)

## 2020-10-12 NOTE — ED Notes (Signed)
This tech tried to get pt in bed pt refused and said she is not getting in the bed I told pt that we needed to get her on the monitor and pt stated no

## 2020-10-12 NOTE — Progress Notes (Addendum)
[place]   [phone] [phone]   Clinic Follow up Note   Patient Care Team: Jose Persia, MD as PCP - Evalina Field, MD as Consulting Physician (Ophthalmology) Coralie Keens, MD as Consulting Physician (General Surgery) Truitt Merle, MD as Consulting Physician (Hematology) Comer, Okey Regal, MD as Consulting Physician (Infectious Diseases)  Date of Service:  10/12/2020  CHIEF COMPLAINT: Abdominal pain  CURRENT THERAPY:  -IV Feraheme as needed, last dose 09/12/20 -Blood transfusions as needed, last 08/05/20 -Bevacizumab (Zirabev) starting 08/10/20. Held since 08/24/20 due to increased BP and proteinuria. May restart bevacizumab if she develops GI bleeding again.  INTERVAL HISTORY:  [name] is here for a follow up. She was last seen by me 09/21/20. She presents to the clinic alone. She notes for the past 2 days she has abdominal pain. She notes anything she tries to eat or drinks she vomits. She notes her last BM was yesterday and normal. She denies fever at home and change in diet. She notes she came to clinic today by Melburn Popper. She notes frustration with coming to clinic early this morning about her pain, but not being seen until later and now having to wait for further work up and treatment. She is willing to be admitted to [facility] hospital at this time.    REVIEW OF SYSTEMS:   Constitutional: Denies fevers, chills or abnormal weight loss Eyes: Denies blurriness of vision Ears, nose, mouth, throat, and face: Denies mucositis or sore throat Respiratory: Denies cough, dyspnea or wheezes Cardiovascular: Denies palpitation, chest discomfort or lower extremity swelling Gastrointestinal:  Denies nausea, heartburn or change in bowel habits (+) Abdominal pain  Skin: Denies abnormal skin rashes Lymphatics: Denies new lymphadenopathy or easy bruising Neurological:Denies numbness, tingling or new weaknesses Behavioral/Psych: Mood is stable, no new  changes  All other systems were reviewed with the patient and are negative.  MEDICAL HISTORY:  Past Medical History:  Diagnosis Date  . Allergic rhinitis   . Anemia, iron deficiency 05/03/2011   2/2 GI AVMs - recieves iron infusions as well as periodic red blood cell transfusions  . Anxiety   . Arteriovenous malformation of duodenum 02/25/2015  . CARPAL TUNNEL RELEASE, HX OF 07/22/2008  . Chest pain 10/28/2019  . Colon polyps   . Diabetes mellitus without complication ([place])    type 2  . Difficulty sleeping    takes trazadone for sleep  . Diverticulosis   . Fall 01/03/2020  . GERD (gastroesophageal reflux disease)   . Hepatitis C    in SVR with Harvoni 12/2014-02/2015.   Marland Kitchen History of hernia repair 08/18/2012  . History of porphyria 11/30/2014   Porphyria cutanea tarda in setting of chronic Hepatitis C, frequent  IV iron infusions, and alcohol abuse   . Hypertension   . Hypocalcemia 10/28/2019  . Hypokalemia 09/27/2014  . Peripheral vascular disease ([place])   . Stroke [facility]) 2010   no residual deficits  . Substance use disorder    cocaine  . Tachycardia 07/10/2017  . Trichomonas vaginitis 04/16/2019    SURGICAL HISTORY: Past Surgical History:  Procedure Laterality Date  . ABDOMINAL HYSTERECTOMY    . APPLICATION OF WOUND VAC N/A 06/21/2014   Procedure: APPLICATION OF WOUND VAC;  Surgeon: Coralie Keens, MD;  Location: [place];  Service: General;  Laterality: N/A;  . CARPAL TUNNEL RELEASE     rt hand  . COLONOSCOPY WITH PROPOFOL N/A 03/17/2018   Procedure: COLONOSCOPY WITH PROPOFOL;  Surgeon: Nelida Meuse  III, MD;  Location: [facility];  Service: Gastroenterology;  Laterality: N/A;  . ENTEROSCOPY N/A 12/04/2012   Procedure: ENTEROSCOPY;  Surgeon: Beryle Beams, MD;  Location: [facility];  Service: Endoscopy;  Laterality: N/A;  . ENTEROSCOPY N/A 12/18/2012   Procedure: ENTEROSCOPY;  Surgeon: Beryle Beams, MD;  Location: [facility];  Service: Endoscopy;  Laterality: N/A;  .  ENTEROSCOPY N/A 02/25/2015   Procedure: ENTEROSCOPY;  Surgeon: Inda Castle, MD;  Location: [facility] END[facility]COPY;  Service: Endoscopy;  Laterality: N/A;  . ENTEROSCOPY N/A 10/29/2017   Procedure: ENTEROSCOPY;  Surgeon: Doran Stabler, MD;  [facility]: [facility];  Service: Gastroenterology;  Laterality: N/A;  . ENTEROSCOPY N/A 07/08/2020   Procedure: ENTEROSCOPY;  Surgeon: Milus Banister, MD;  Location: [facility];  Service: Endoscopy;  Laterality: N/A;  . ESOPHAGOGASTRODUODENOSCOPY  05/05/2011   Procedure: ESOPHAGOGASTRODUODENOSCOPY (EGD);  Surgeon: Zenovia Jarred, MD;  Location[facility];  Service: Gastroenterology;  Laterality: N/A;  Dr. Hilarie Fredrickson will do procedure for Dr. Benson Norway Saturday.  . ESOPHAGOGASTRODUODENOSCOPY  05/08/2011   Procedure: ESOPHAGOGASTRODUODENOSCOPY (EGD);  Surgeon: Beryle Beams;  [facility];  Service: Endoscopy;  Laterality: N/A;  . ESOPHAGOGASTRODUODENOSCOPY  06/07/2011   Procedure: ESOPHAGOGASTRODUODENOSCOPY (EGD);  Surgeon: Beryle Beams, MD;  Location: [facility];  Service: Endoscopy;  Laterality: N/A;  . ESOPHAGOGASTRODUODENOSCOPY  12/20/2011   Procedure: ESOPHAGOGASTRODUODENOSCOPY (EGD);  Surgeon: Beryle Beams, MD;  Location: [facility];  Service: Endoscopy;  Laterality: N/A;  . EYE SURGERY Bilateral    cataracts  . FLEXIBLE SIGMOIDOSCOPY  12/21/2011   Procedure: FLEXIBLE SIGMOIDOSCOPY;  Surgeon: Beryle Beams, MD;  [facility];  Service: Endoscopy;  Laterality: N/A;  . HOT HEMOSTASIS  06/07/2011   Procedure: HOT HEMOSTASIS (ARGON PLASMA COAGULATION/BICAP);  Surgeon: Beryle Beams, MD;  Location: [facility];  Service: Endoscopy;  Laterality: N/A;  . HOT HEMOSTASIS N/A 07/08/2020   Procedure: HOT HEMOSTASIS (ARGON PLASMA COAGULATION/BICAP);  Surgeon: Milus Banister, MD;  Location: [facility];  Service: Endoscopy;  Laterality: N/A;  . INCISIONAL HERNIA REPAIR N/A 06/01/2014   Procedure: ATTEMPTED LAPAROSCOPIC AND OPEN INCISIONAL HERNIA  REPAIR WITH MESH;  Surgeon: Coralie Keens, MD;  Location: [place];  Service: General;  Laterality: N/A;  . INSERTION OF MESH N/A 06/01/2014   Procedure: INSERTION OF MESH;  Surgeon: Coralie Keens, MD;  Location: [place];  Service: General;  Laterality: N/A;  . LAPAROTOMY  02/16/2012   Procedure: EXPLORATORY LAPAROTOMY;  Surgeon: Edward Jolly, MD;  Location: [facility];  Service: General;  Laterality: N/A;  oversewing of anastomotic leak and rigid sigmoidoscopy  . LAPAROTOMY N/A 06/21/2014   Procedure: ABDOMINAL WOUND EXPLORATION;  Surgeon: Coralie Keens, MD;  Location: [place];  Service: General;  Laterality: N/A;  . LEG AMPUTATION ABOVE KNEE     left  . PARTIAL COLECTOMY  02/15/2012   Procedure: PARTIAL COLECTOMY;  Surgeon: Harl Bowie, MD;  Location: [facility];  Service: General;  Laterality: N/A;  . SUBMUCOSAL INJECTION  07/08/2020   Procedure: SUBMUCOSAL INJECTION;  Surgeon: Milus Banister, MD;  Location[facility];  Service: Endoscopy;;  . TONSILLECTOMY      I have reviewed the social history and family history with the patient and they are unchanged from previous note.  ALLERGIES:  is allergic to ciprofloxacin, morphine and related, penicillins, codeine, lisinopril, and feraheme [ferumoxytol].  MEDICATIONS:  Current Outpatient Medications  Medication Sig Dispense Refill  . ACCU-CHEK FASTCLIX LANCETS MISC Use to test blood glucose 3 times daily.  100 each 11  . acetaminophen (TYLENOL) 500 MG tablet Take 1,000 mg by mouth every 6 (six) hours as needed.    Marland Kitchen allopurinol (ZYLOPRIM) 100 MG tablet TAKE 1 TABLET (100 MG TOTAL) BY MOUTH DAILY. 30 tablet 5  . amLODipine (NORVASC) 10 MG tablet Take 1 tablet (10 mg total) by mouth daily. 90 tablet 3  . aspirin EC 81 MG tablet Take 1 tablet (81 mg total) by mouth daily. 30 tablet 1  . Blood Glucose Monitoring Suppl (ACCU-CHEK GUIDE) w/Device KIT 1 each by Does not apply route 3 (three) times daily. The patient is insulin requiring, ICD  10 code E11.65. The patient tests 3 times a day. 1 kit 1  . cetirizine (ZYRTEC) 10 MG tablet Take 1 tablet (10 mg total) by mouth daily. 30 tablet 0  . cholecalciferol (VITAMIN D) 1000 units tablet Take 1 tablet (1,000 Units total) by mouth daily. 100 tablet 2  . colchicine 0.6 MG tablet Take 1 tablet (0.6 mg total) by mouth as needed (gout). 30 tablet 0  . diazepam (VALIUM) 5 MG tablet TAKE 1 TABLET (5 MG TOTAL) BY MOUTH AT BEDTIME AS NEEDED FOR ANXIETY. 4 tablet 0  . Dulaglutide 0.75 MG/0.5ML SOPN INJECT 0.75 MG INTO THE SKIN ONCE A WEEK. 6 mL 3  . feeding supplement, GLUCERNA SHAKE, (GLUCERNA SHAKE) LIQD Take 237 mLs by mouth 3 (three) times daily between meals. (Patient taking differently: Take 237 mLs by mouth 2 (two) times daily between meals.) 90 Can 0  . gabapentin (NEURONTIN) 400 MG capsule TAKE 1 TO 2 CAPSULES BY MOUTH 3 TIMES A DAY 180 capsule 0  . glucose blood (ACCU-CHEK GUIDE) test strip USE TO TEST BLOOD SUGAR THREE TIMES A DAY 100 strip 3  . losartan (COZAAR) 100 MG tablet TAKE 1 TABLET (100 MG TOTAL) BY MOUTH DAILY. 90 tablet 3  . metFORMIN (GLUCOPHAGE) 1000 MG tablet TAKE 1 TABLET BY MOUTH TWICE A DAY WITH A MEAL (Patient taking differently: Take 1,000 mg by mouth daily.) 90 tablet 6  . omeprazole (PRILOSEC) 20 MG capsule Take 1 capsule (20 mg total) by mouth daily. 90 capsule 1   No current facility-administered medications for this visit.    PHYSICAL EXAMINATION: ECOG PERFORMANCE STATUS: 3 - Symptomatic, >50% confined to bed  Vitals:   10/12/20 1452  BP: 132/83  Pulse: 83  Resp: 18  SpO2: 98%   Filed Weights    GENERAL:alert, no distress and comfortable SKIN: skin color, texture, turgor are normal, no rashes or significant lesions EYES: normal, Conjunctiva are pink and non-injected, sclera clear  NECK: supple, thyroid normal size, non-tender, without nodularity LYMPH:  no palpable lymphadenopathy in the cervical, axillary  LUNGS: clear to auscultation and  percussion with normal breathing effort HEART: regular rate & rhythm and no murmurs and no lower extremity edema ABDOMEN:abdomen soft, non-tender and normal bowel sounds (+) Diffuse upper abdominal pain  Musculoskeletal:no cyanosis of digits and no clubbing  NEURO: alert & oriented x 3 with fluent speech, no focal motor/sensory deficits EXAM PERFORMED IN WHEELCHAIR TODAY   LABORATORY DATA:  I have reviewed the data as listed CBC Latest Ref Rng & Units 10/12/2020 09/21/2020 09/12/2020  WBC 4.0 - 10.5 K/uL 8.6 8.6 8.7  Hemoglobin 12.0 - 15.0 g/dL 14.4 14.0 14.3  Hematocrit 36.0 - 46.0 % 41.7 41.2 41.8  Platelets 150 - 400 K/uL 233 256 246     CMP Latest Ref Rng & Units 08/24/2020 07/10/2020 07/09/2020  Glucose 70 - 99  mg/dL 118(H) 133(H) 121(H)  BUN 8 - 23 mg/dL $Remove'22 17 18  'WCyTEWn$ Creatinine 0.44 - 1.00 mg/dL 1.06(H) 1.10(H) 1.21(H)  Sodium 135 - 145 mmol/L 144 138 135  Potassium 3.5 - 5.1 mmol/L 4.4 3.5 3.7  Chloride 98 - 111 mmol/L 107 103 101  CO2 22 - 32 mmol/L $RemoveB'27 26 25  'JsFuaATD$ Calcium 8.9 - 10.3 mg/dL 8.7(L) 9.1 8.8(L)  Total Protein 6.5 - 8.1 g/dL - - -  Total Bilirubin 0.3 - 1.2 mg/dL - - -  Alkaline Phos 38 - 126 U/L - - -  AST 15 - 41 U/L - - -  ALT 0 - 44 U/L - - -      RADIOGRAPHIC STUDIES: I have personally reviewed the radiological images as listed and agreed with the findings in the report. No results found.   ASSESSMENT & PLAN:  [name] is a 70 y.o. female with    1. Acute abdominal pain  -Onset 2 days ago. Not able to keep down food or liquid. Last BM yesterday was normal. She denies fever.  -Physical exam today shows diffuse upper abdominal tenderness. Labs reviewed, CBC WNL. Iron 74, ferritin 459. Vitals in normal range.  -The differential is broad, could be acute gastritis, colitis, cholecystitis, etc,  -I recommend further evaluation in ED with CT Scan and start IV fluids and pain medication. She agreed but reluctant to be admitted to hospital. -I called [facility] ED and  spoke with charge nurse, will send her over.  2. Iron deficiency anemia secondary to GIbloodloss,andAnemia of Chronic Disease   3. Porphyria cutaneoustarda (PCT), resolvedafter Hep C Treatment  4.Chronic hepatitis C infection 5. DM, HTN   Plan -Will send her to [facility] ED for further evaluation    No problem-specific Assessment & Plan notes found for this encounter.   No orders of the defined types were placed in this encounter.  All questions were answered. The patient knows to call the clinic with any problems, questions or concerns. No barriers to learning was detected. The total time spent in the appointment was 20 minutes.     Truitt Merle, MD 10/12/2020   I, Joslyn Devon, am acting as scribe for Truitt Merle, MD.   I have reviewed the above documentation for accuracy and completeness, and I agree with the above.   Addendum  Pt arrived ED room 8, but refused getting into bed, lab draw etc, and wanted to go home. I went to ED and spoke with her while she was waiting outside ED.She was very unhappy that we did not see her this morning and she wanted to go home. I recommend her to return to ED if she has persistent or worsening pain tonight or tomorrow. I offered calling in tylenol #3 or tramadol for her pain but she declined. She was going to go home with taxi.   Truitt Merle  10/12/2020

## 2020-10-12 NOTE — ED Notes (Signed)
Patient states she has been here since 8 am-states she informed her oncologist and RN that she wanted to go home and return in am for additional testing-writer called oncologist that patient refused treatment in [place] wheeled patient to lobby for pick up

## 2020-10-12 NOTE — ED Notes (Signed)
Per oncologist, states increased abdominal pain for 2 days-history of anemia and hep c-sending for CT

## 2020-10-12 NOTE — ED Provider Notes (Signed)
[facility] DEPT Provider Note   CSN: [id number] Arrival date & time: 10/12/20  1541     History No chief complaint on file.   [name] is a 70 y.o. female.  Patient complains of chest pain either side of her chest.  Underneath both breasts.  No shortness of breath no sweating the pain just started today   Chest Pain Pain location:  L chest and R chest Pain quality: aching   Pain radiates to:  Does not radiate Pain severity:  Moderate Onset quality:  Sudden Timing:  Constant Progression:  Worsening Chronicity:  New Context: not breathing   Relieved by:  Nothing Associated symptoms: no abdominal pain, no back pain, no cough, no fatigue and no headache        Past Medical History:  Diagnosis Date  . Allergic rhinitis   . Anemia, iron deficiency 05/03/2011   2/2 GI AVMs - recieves iron infusions as well as periodic red blood cell transfusions  . Anxiety   . Arteriovenous malformation of duodenum 02/25/2015  . CARPAL TUNNEL RELEASE, HX OF 07/22/2008  . Chest pain 10/28/2019  . Colon polyps   . Diabetes mellitus without complication ([place])    type 2  . Difficulty sleeping    takes trazadone for sleep  . Diverticulosis   . Fall 01/03/2020  . GERD (gastroesophageal reflux disease)   . Hepatitis C    in SVR with Harvoni 12/2014-02/2015.   Marland Kitchen History of hernia repair 08/18/2012  . History of porphyria 11/30/2014   Porphyria cutanea tarda in setting of chronic Hepatitis C, frequent  IV iron infusions, and alcohol abuse   . Hypertension   . Hypocalcemia 10/28/2019  . Hypokalemia 09/27/2014  . Peripheral vascular disease ([place])   . Stroke [facility]) 2010   no residual deficits  . Substance use disorder    cocaine  . Tachycardia 07/10/2017  . Trichomonas vaginitis 04/16/2019    Patient Active Problem List   Diagnosis Date Noted  . Abdominal pain 10/12/2020  . Grief 07/21/2020  . GI bleed 07/07/2020  . Urinary incontinence 02/08/2020  . Spleen  hematoma 11/02/2019  . Microscopic hematuria 07/17/2019  . Sleep disorder 02/04/2019  . Kidney disease 02/04/2019  . Tinea corporis 06/26/2018  . Generalized anxiety disorder 07/24/2017  . Tobacco use disorder 07/24/2017  . Atherosclerosis of abdominal aorta ([place]) 10/25/2016  . Gout involving toe of right foot 02/28/2016  . Alcohol use 07/16/2015  . History of hepatitis C 03/15/2015  . Substance use disorder 03/05/2015  . Iron deficiency anemia due to chronic blood loss 02/23/2015  . Vitamin D deficiency 12/02/2014  . Preventative health care 11/30/2014  . Depression 07/10/2013  . Type 2 diabetes mellitus with diabetic neuropathy ([place]) 06/07/2013  . Above knee amputation of left lower extremity ([place]) 08/18/2012  . Angiodysplasia of duodenum 08/03/2011  . Essential hypertension, benign 07/22/2008  . GERD 07/22/2008    Past Surgical History:  Procedure Laterality Date  . ABDOMINAL HYSTERECTOMY    . APPLICATION OF WOUND VAC N/A 06/21/2014   Procedure: APPLICATION OF WOUND VAC;  Surgeon: Coralie Keens, MD;  Location: [facility];  Service: General;  Laterality: N/A;  . CARPAL TUNNEL RELEASE     rt hand  . COLONOSCOPY WITH PROPOFOL N/A 03/17/2018   Procedure: COLONOSCOPY WITH PROPOFOL;  Surgeon: Doran Stabler, MD;  Location: [facility];  Service: Gastroenterology;  Laterality: N/A;  . ENTEROSCOPY N/A 12/04/2012   Procedure: ENTEROSCOPY;  Surgeon: Tory Emerald  Benson Norway, MD;  Location: [facility];  Service: Endoscopy;  Laterality: N/A;  . ENTEROSCOPY N/A 12/18/2012   Procedure: ENTEROSCOPY;  Surgeon: Beryle Beams, MD;  Location: [facility];  Service: Endoscopy;  Laterality: N/A;  . ENTEROSCOPY N/A 02/25/2015   Procedure: ENTEROSCOPY;  Surgeon: Inda Castle, MD;  Location: [facility];  Service: Endoscopy;  Laterality: N/A;  . ENTEROSCOPY N/A 10/29/2017   Procedure: ENTEROSCOPY;  Surgeon: Doran Stabler, MD;  Location: [facility];  Service: Gastroenterology;  Laterality: N/A;   . ENTEROSCOPY N/A 07/08/2020   Procedure: ENTEROSCOPY;  Surgeon: Milus Banister, MD;  Location: [facility];  Service: Endoscopy;  Laterality: N/A;  . ESOPHAGOGASTRODUODENOSCOPY  05/05/2011   Procedure: ESOPHAGOGASTRODUODENOSCOPY (EGD);  Surgeon: Zenovia Jarred, MD;  Location: [facility];  Service: Gastroenterology;  Laterality: N/A;  Dr. Hilarie Fredrickson will do procedure for Dr. Benson Norway Saturday.  . ESOPHAGOGASTRODUODENOSCOPY  05/08/2011   Procedure: ESOPHAGOGASTRODUODENOSCOPY (EGD);  Surgeon: Beryle Beams;  [facility]: [facility];  Service: Endoscopy;  Laterality: N/A;  . ESOPHAGOGASTRODUODENOSCOPY  06/07/2011   Procedure: ESOPHAGOGASTRODUODENOSCOPY (EGD);  Surgeon: Beryle Beams, MD;  Location: [facility];  Service: Endoscopy;  Laterality: N/A;  . ESOPHAGOGASTRODUODENOSCOPY  12/20/2011   Procedure: ESOPHAGOGASTRODUODENOSCOPY (EGD);  Surgeon: Beryle Beams, MD;  Location: [facility];  Service: Endoscopy;  Laterality: N/A;  . EYE SURGERY Bilateral    cataracts  . FLEXIBLE SIGMOIDOSCOPY  12/21/2011   Procedure: FLEXIBLE SIGMOIDOSCOPY;  Surgeon: Beryle Beams, MD;  [facility];  Service: Endoscopy;  Laterality: N/A;  . HOT HEMOSTASIS  06/07/2011   Procedure: HOT HEMOSTASIS (ARGON PLASMA COAGULATION/BICAP);  Surgeon: Beryle Beams, MD;  Location: [facility];  Service: Endoscopy;  Laterality: N/A;  . HOT HEMOSTASIS N/A 07/08/2020   Procedure: HOT HEMOSTASIS (ARGON PLASMA COAGULATION/BICAP);  Surgeon: Milus Banister, MD;  Location: [facility];  Service: Endoscopy;  Laterality: N/A;  . INCISIONAL HERNIA REPAIR N/A 06/01/2014   Procedure: ATTEMPTED LAPAROSCOPIC AND OPEN INCISIONAL HERNIA REPAIR WITH MESH;  Surgeon: Coralie Keens, MD;  Location: [facility];  Service: General;  Laterality: N/A;  . INSERTION OF MESH N/A 06/01/2014   Procedure: INSERTION OF MESH;  Surgeon: Coralie Keens, MD;  Location: [place];  Service: General;  Laterality: N/A;  . LAPAROTOMY  02/16/2012   Procedure: EXPLORATORY  LAPAROTOMY;  Surgeon: Edward Jolly, MD;  Location: [facility];  Service: General;  Laterality: N/A;  oversewing of anastomotic leak and rigid sigmoidoscopy  . LAPAROTOMY N/A 06/21/2014   Procedure: ABDOMINAL WOUND EXPLORATION;  Surgeon: Coralie Keens, MD;  Location: [place];  Service: General;  Laterality: N/A;  . LEG AMPUTATION ABOVE KNEE     left  . PARTIAL COLECTOMY  02/15/2012   Procedure: PARTIAL COLECTOMY;  Surgeon: Harl Bowie, MD;  Location: [facility];  Service: General;  Laterality: N/A;  . SUBMUCOSAL INJECTION  07/08/2020   Procedure: SUBMUCOSAL INJECTION;  Surgeon: Milus Banister, MD;  Location: [facility];  Service: Endoscopy;;  . TONSILLECTOMY       OB History   No obstetric history on file.     Family History  Problem Relation Age of Onset  . Cancer Father        unknown  . Hypertension Mother   . Diverticulitis Mother     Social History   Tobacco Use  . Smoking status: Current Some Day Smoker    Packs/day: 0.50    Years: 17.00    Pack years: 8.50    Types: Cigarettes  . Smokeless  tobacco: Never Used  . Tobacco comment: 1 pack last 1 month  Vaping Use  . Vaping Use: Never used  Substance Use Topics  . Alcohol use: Yes    Alcohol/week: 0.0 standard drinks  . Drug use: Yes    Types: Cocaine, Marijuana    Comment: Sometimes.    Home Medications Prior to Admission medications   Medication Sig Start Date End Date Taking? Authorizing Provider  ACCU-CHEK FASTCLIX LANCETS MISC Use to test blood glucose 3 times daily. 10/04/16   [name], MD  acetaminophen (TYLENOL) 500 MG tablet Take 1,000 mg by mouth every 6 (six) hours as needed.    [provider]  allopurinol (ZYLOPRIM) 100 MG tablet TAKE 1 TABLET (100 MG TOTAL) BY MOUTH DAILY. 10/23/19   Jose Persia, MD  amLODipine (NORVASC) 10 MG tablet Take 1 tablet (10 mg total) by mouth daily. 07/21/20   Jose Persia, MD  aspirin EC 81 MG tablet Take 1 tablet (81 mg total) by mouth daily.  03/04/15   Burgess Estelle, MD  Blood Glucose Monitoring Suppl (ACCU-CHEK GUIDE) w/Device KIT 1 each by Does not apply route 3 (three) times daily. The patient is insulin requiring, ICD 10 code E11.65. The patient tests 3 times a day. 02/05/19   Jose Persia, MD  cetirizine (ZYRTEC) 10 MG tablet Take 1 tablet (10 mg total) by mouth daily. 09/06/20   Jeralyn Bennett, MD  cholecalciferol (VITAMIN D) 1000 units tablet Take 1 tablet (1,000 Units total) by mouth daily. 10/04/16   [name], MD  colchicine 0.6 MG tablet Take 1 tablet (0.6 mg total) by mouth as needed (gout). 08/09/20   Sanjuan Dame, MD  diazepam (VALIUM) 5 MG tablet TAKE 1 TABLET (5 MG TOTAL) BY MOUTH AT BEDTIME AS NEEDED FOR ANXIETY. 07/21/20 01/17/21  Jose Persia, MD  Dulaglutide 0.75 MG/0.5ML SOPN INJECT 0.75 MG INTO THE SKIN ONCE A WEEK. 07/21/20 07/21/21  Jose Persia, MD  feeding supplement, GLUCERNA SHAKE, (GLUCERNA SHAKE) LIQD Take 237 mLs by mouth 3 (three) times daily between meals. Patient taking differently: Take 237 mLs by mouth 2 (two) times daily between meals. 10/04/16   [name], MD  gabapentin (NEURONTIN) 400 MG capsule TAKE 1 TO 2 CAPSULES BY MOUTH 3 TIMES A DAY 08/10/20   Sanjuan Dame, MD  glucose blood (ACCU-CHEK GUIDE) test strip USE TO TEST BLOOD SUGAR THREE TIMES A DAY 06/17/20   Aslam, Loralyn Freshwater, MD  losartan (COZAAR) 100 MG tablet TAKE 1 TABLET (100 MG TOTAL) BY MOUTH DAILY. 07/21/20 07/21/21  Jose Persia, MD  metFORMIN (GLUCOPHAGE) 1000 MG tablet TAKE 1 TABLET BY MOUTH TWICE A DAY WITH A MEAL Patient taking differently: Take 1,000 mg by mouth daily. 04/14/20   Katsadouros, Vasilios, MD  omeprazole (PRILOSEC) 20 MG capsule Take 1 capsule (20 mg total) by mouth daily. 04/18/20   Jose Persia, MD    Allergies    Ciprofloxacin, Morphine and related, Penicillins, Codeine, Lisinopril, and Feraheme [ferumoxytol]  Review of Systems   Review of Systems  Constitutional: Negative for appetite  change and fatigue.  HENT: Negative for congestion, ear discharge and sinus pressure.   Eyes: Negative for discharge.  Respiratory: Negative for cough.   Cardiovascular: Positive for chest pain.  Gastrointestinal: Negative for abdominal pain and diarrhea.  Genitourinary: Negative for frequency and hematuria.  Musculoskeletal: Negative for back pain.  Skin: Negative for rash.  Neurological: Negative for seizures and headaches.  Psychiatric/Behavioral: Negative for hallucinations.    Physical Exam Updated Vital Signs There  were no vitals taken for this visit.  Physical Exam Vitals and nursing note reviewed.  Constitutional:      Appearance: She is well-developed.  HENT:     Head: Normocephalic.     Nose: Nose normal.  Eyes:     General: No scleral icterus.    Conjunctiva/sclera: Conjunctivae normal.  Neck:     Thyroid: No thyromegaly.  Cardiovascular:     Rate and Rhythm: Normal rate and regular rhythm.     Heart sounds: No murmur heard. No friction rub. No gallop.   Pulmonary:     Breath sounds: No stridor. No wheezing or rales.  Chest:     Chest wall: Tenderness present.  Abdominal:     General: There is no distension.     Tenderness: There is no abdominal tenderness. There is no rebound.  Musculoskeletal:        General: Normal range of motion.     Cervical back: Neck supple.  Lymphadenopathy:     Cervical: No cervical adenopathy.  Skin:    Findings: No erythema or rash.  Neurological:     Mental Status: She is alert and oriented to person, place, and time.     Motor: No abnormal muscle tone.     Coordination: Coordination normal.  Psychiatric:        Behavior: Behavior normal.     ED Results / Procedures / Treatments   Labs (all labs ordered are listed, but only abnormal results are displayed) Labs Reviewed - No data to display  EKG None  Radiology No results found.  Procedures Procedures   Medications Ordered in ED Medications - No data to  display  ED Course  I have reviewed the triage vital signs and the nursing notes.  Pertinent labs & imaging results that were available during my care of the patient were reviewed by me and considered in my medical decision making (see chart for details).    MDM Rules/Calculators/A&P                          Patient with atypical chest pain.  Patient refuses to get work-up which included chest x-ray EKG and cardiac enzymes.  Patient decided to leave [facility] Final Clinical Impression(s) / ED Diagnoses Final diagnoses:  Atypical chest pain    Rx / DC Orders ED Discharge Orders    None       Milton Ferguson, MD 10/12/20 [phone]

## 2020-10-12 NOTE — Telephone Encounter (Signed)
[name] was here for lab appt.  She requested to be seen.  She states she has upper abdominal pain across the entire front.  She states it is "rib" pain.  She has not taken any pain medication.  She denies fever/cough/nausea/vomiting.  She state she is unable to eat or drink secondary to the pain.  Offered 3pm appt today per Dr Burr Medico.  She accepted.

## 2020-10-12 NOTE — Discharge Instructions (Addendum)
Return to the emergency department if you want this chest pain evaluated

## 2020-10-13 ENCOUNTER — Other Ambulatory Visit: Payer: Self-pay

## 2020-10-13 ENCOUNTER — Emergency Department (HOSPITAL_COMMUNITY): Payer: Medicare Other

## 2020-10-13 ENCOUNTER — Telehealth: Payer: Self-pay | Admitting: *Deleted

## 2020-10-13 ENCOUNTER — Encounter (HOSPITAL_COMMUNITY): Payer: Self-pay | Admitting: Pharmacy Technician

## 2020-10-13 ENCOUNTER — Inpatient Hospital Stay (HOSPITAL_COMMUNITY)
Admission: EM | Admit: 2020-10-13 | Discharge: 2020-10-15 | DRG: 641 | Disposition: A | Discharge status: Home / Self Care | Payer: Medicare Other | Attending: Internal Medicine | Admitting: Internal Medicine

## 2020-10-13 DIAGNOSIS — F1210 Cannabis abuse, uncomplicated: Secondary | ICD-10-CM | POA: Diagnosis present

## 2020-10-13 DIAGNOSIS — F32A Depression, unspecified: Secondary | ICD-10-CM | POA: Diagnosis present

## 2020-10-13 DIAGNOSIS — Z7984 Long term (current) use of oral hypoglycemic drugs: Secondary | ICD-10-CM

## 2020-10-13 DIAGNOSIS — F419 Anxiety disorder, unspecified: Secondary | ICD-10-CM | POA: Diagnosis present

## 2020-10-13 DIAGNOSIS — I10 Essential (primary) hypertension: Secondary | ICD-10-CM | POA: Diagnosis present

## 2020-10-13 DIAGNOSIS — F1410 Cocaine abuse, uncomplicated: Secondary | ICD-10-CM | POA: Diagnosis present

## 2020-10-13 DIAGNOSIS — F17210 Nicotine dependence, cigarettes, uncomplicated: Secondary | ICD-10-CM | POA: Diagnosis present

## 2020-10-13 DIAGNOSIS — Z8673 Personal history of transient ischemic attack (TIA), and cerebral infarction without residual deficits: Secondary | ICD-10-CM | POA: Diagnosis not present

## 2020-10-13 DIAGNOSIS — R202 Paresthesia of skin: Secondary | ICD-10-CM | POA: Diagnosis present

## 2020-10-13 DIAGNOSIS — Z7982 Long term (current) use of aspirin: Secondary | ICD-10-CM

## 2020-10-13 DIAGNOSIS — R200 Anesthesia of skin: Secondary | ICD-10-CM

## 2020-10-13 DIAGNOSIS — E876 Hypokalemia: Secondary | ICD-10-CM

## 2020-10-13 DIAGNOSIS — Z90710 Acquired absence of both cervix and uterus: Secondary | ICD-10-CM | POA: Diagnosis not present

## 2020-10-13 DIAGNOSIS — Z79899 Other long term (current) drug therapy: Secondary | ICD-10-CM

## 2020-10-13 DIAGNOSIS — F1010 Alcohol abuse, uncomplicated: Secondary | ICD-10-CM | POA: Diagnosis present

## 2020-10-13 DIAGNOSIS — K219 Gastro-esophageal reflux disease without esophagitis: Secondary | ICD-10-CM | POA: Diagnosis present

## 2020-10-13 DIAGNOSIS — E1151 Type 2 diabetes mellitus with diabetic peripheral angiopathy without gangrene: Secondary | ICD-10-CM | POA: Diagnosis present

## 2020-10-13 DIAGNOSIS — Z20822 Contact with and (suspected) exposure to covid-19: Secondary | ICD-10-CM | POA: Diagnosis present

## 2020-10-13 DIAGNOSIS — Z89612 Acquired absence of left leg above knee: Secondary | ICD-10-CM

## 2020-10-13 DIAGNOSIS — M109 Gout, unspecified: Secondary | ICD-10-CM | POA: Diagnosis present

## 2020-10-13 LAB — CBC WITH DIFFERENTIAL/PLATELET
Abs Immature Granulocytes: 0.05 10*3/uL (ref 0.00–0.07)
Abs Immature Granulocytes: 0.05 10*3/uL (ref 0.00–0.07)
Basophils Absolute: 0 10*3/uL (ref 0.0–0.1)
Basophils Absolute: 0 10*3/uL (ref 0.0–0.1)
Basophils Relative: 0 %
Basophils Relative: 1 %
Eosinophils Absolute: 0.1 10*3/uL (ref 0.0–0.5)
Eosinophils Absolute: 0.1 10*3/uL (ref 0.0–0.5)
Eosinophils Relative: 1 %
Eosinophils Relative: 1 %
HCT: 42 % (ref 36.0–46.0)
HCT: 40.7 % (ref 36.0–46.0)
Hemoglobin: 14.5 g/dL (ref 12.0–15.0)
Hemoglobin: 13.9 g/dL (ref 12.0–15.0)
Immature Granulocytes: 1 %
Immature Granulocytes: 1 %
Lymphocytes Relative: 16 %
Lymphocytes Relative: 20 %
Lymphs Abs: 1.4 10*3/uL (ref 0.7–4.0)
Lymphs Abs: 1.6 10*3/uL (ref 0.7–4.0)
MCH: 32.6 pg (ref 26.0–34.0)
MCH: 32.6 pg (ref 26.0–34.0)
MCHC: 34.5 g/dL (ref 30.0–36.0)
MCHC: 34.2 g/dL (ref 30.0–36.0)
MCV: 94.4 fL (ref 80.0–100.0)
MCV: 95.3 fL (ref 80.0–100.0)
Monocytes Absolute: 0.9 10*3/uL (ref 0.1–1.0)
Monocytes Absolute: 0.9 10*3/uL (ref 0.1–1.0)
Monocytes Relative: 11 %
Monocytes Relative: 12 %
Neutro Abs: 6.1 10*3/uL (ref 1.7–7.7)
Neutro Abs: 5.5 10*3/uL (ref 1.7–7.7)
Neutrophils Relative %: 71 %
Neutrophils Relative %: 65 %
Platelets: 264 10*3/uL (ref 150–400)
Platelets: 242 10*3/uL (ref 150–400)
RBC: 4.45 MIL/uL (ref 3.87–5.11)
RBC: 4.27 MIL/uL (ref 3.87–5.11)
RDW: 13.3 % (ref 11.5–15.5)
RDW: 13.4 % (ref 11.5–15.5)
WBC: 8.5 10*3/uL (ref 4.0–10.5)
WBC: 8.2 10*3/uL (ref 4.0–10.5)
nRBC: 0 % (ref 0.0–0.2)
nRBC: 0 % (ref 0.0–0.2)

## 2020-10-13 LAB — COMPREHENSIVE METABOLIC PANEL
ALT: 17 U/L (ref 0–44)
ALT: 16 U/L (ref 0–44)
AST: 22 U/L (ref 15–41)
AST: 22 U/L (ref 15–41)
Albumin: 3.4 g/dL — ABNORMAL LOW (ref 3.5–5.0)
Albumin: 3.2 g/dL — ABNORMAL LOW (ref 3.5–5.0)
Alkaline Phosphatase: 53 U/L (ref 38–126)
Alkaline Phosphatase: 50 U/L (ref 38–126)
Anion gap: 11 (ref 5–15)
Anion gap: 11 (ref 5–15)
BUN: 11 mg/dL (ref 8–23)
BUN: 11 mg/dL (ref 8–23)
CO2: 23 mmol/L (ref 22–32)
CO2: 24 mmol/L (ref 22–32)
Calcium: 5.7 mg/dL — CL (ref 8.9–10.3)
Calcium: 5.7 mg/dL — CL (ref 8.9–10.3)
Chloride: 106 mmol/L (ref 98–111)
Chloride: 108 mmol/L (ref 98–111)
Creatinine, Ser: 1.08 mg/dL — ABNORMAL HIGH (ref 0.44–1.00)
Creatinine, Ser: 1.1 mg/dL — ABNORMAL HIGH (ref 0.44–1.00)
GFR, Estimated: 56 mL/min — ABNORMAL LOW (ref >60)
GFR, Estimated: 55 mL/min — ABNORMAL LOW (ref >60)
Glucose, Bld: 102 mg/dL — ABNORMAL HIGH (ref 70–99)
Glucose, Bld: 99 mg/dL (ref 70–99)
Potassium: 3.2 mmol/L — ABNORMAL LOW (ref 3.5–5.1)
Potassium: 3.1 mmol/L — ABNORMAL LOW (ref 3.5–5.1)
Sodium: 140 mmol/L (ref 135–145)
Sodium: 143 mmol/L (ref 135–145)
Total Bilirubin: 0.4 mg/dL (ref 0.3–1.2)
Total Bilirubin: 0.6 mg/dL (ref 0.3–1.2)
Total Protein: 6.5 g/dL (ref 6.5–8.1)
Total Protein: 6.2 g/dL — ABNORMAL LOW (ref 6.5–8.1)

## 2020-10-13 LAB — MAGNESIUM
Magnesium: 0.6 mg/dL — CL (ref 1.7–2.4)
Magnesium: 0.6 mg/dL — CL (ref 1.7–2.4)

## 2020-10-13 LAB — RESP PANEL BY RT-PCR (FLU A&B, COVID) ARPGX2
Influenza A by PCR: NEGATIVE
Influenza B by PCR: NEGATIVE
SARS Coronavirus 2 by RT PCR: NEGATIVE

## 2020-10-13 LAB — TROPONIN I (HIGH SENSITIVITY): Troponin I (High Sensitivity): 10 ng/L (ref <18)

## 2020-10-13 MED ORDER — CALCIUM CARBONATE ANTACID 500 MG PO CHEW
400.0000 mg | CHEWABLE_TABLET | Freq: Once | ORAL | Status: AC
  Administered 2020-10-13: 400 mg via ORAL
  Filled 2020-10-13: qty 2

## 2020-10-13 MED ORDER — MAGNESIUM OXIDE -MG SUPPLEMENT 400 (240 MG) MG PO TABS
400.0000 mg | ORAL_TABLET | Freq: Once | ORAL | Status: AC
  Administered 2020-10-13: 400 mg via ORAL
  Filled 2020-10-13: qty 1

## 2020-10-13 MED ORDER — AMLODIPINE BESYLATE 10 MG PO TABS
10.0000 mg | ORAL_TABLET | Freq: Every day | ORAL | Status: DC
  Administered 2020-10-14 – 2020-10-15 (×2): 10 mg via ORAL
  Filled 2020-10-13 (×2): qty 1

## 2020-10-13 MED ORDER — ACETAMINOPHEN 325 MG PO TABS
650.0000 mg | ORAL_TABLET | Freq: Four times a day (QID) | ORAL | Status: DC | PRN
  Administered 2020-10-14: 650 mg via ORAL
  Filled 2020-10-13: qty 2

## 2020-10-13 MED ORDER — LOSARTAN POTASSIUM 50 MG PO TABS
100.0000 mg | ORAL_TABLET | Freq: Every day | ORAL | Status: DC
  Administered 2020-10-14 – 2020-10-15 (×2): 100 mg via ORAL
  Filled 2020-10-13 (×2): qty 2

## 2020-10-13 MED ORDER — ACETAMINOPHEN 650 MG RE SUPP: 650.0000 mg | Freq: Four times a day (QID) | RECTAL | Status: DC | PRN

## 2020-10-13 MED ORDER — SENNOSIDES-DOCUSATE SODIUM 8.6-50 MG PO TABS
2.0000 | ORAL_TABLET | Freq: Two times a day (BID) | ORAL | Status: DC
  Administered 2020-10-14: 2 via ORAL
  Filled 2020-10-13 (×4): qty 2

## 2020-10-13 MED ORDER — POTASSIUM CHLORIDE 10 MEQ/100ML IV SOLN
10.0000 meq | Freq: Once | INTRAVENOUS | Status: AC
  Administered 2020-10-13: 10 meq via INTRAVENOUS
  Filled 2020-10-13: qty 100

## 2020-10-13 MED ORDER — POTASSIUM CHLORIDE CRYS ER 20 MEQ PO TBCR
40.0000 meq | EXTENDED_RELEASE_TABLET | Freq: Once | ORAL | Status: AC
  Administered 2020-10-13: 40 meq via ORAL
  Filled 2020-10-13: qty 2

## 2020-10-13 MED ORDER — MAGNESIUM SULFATE 2 GM/50ML IV SOLN
2.0000 g | Freq: Once | INTRAVENOUS | Status: AC
  Administered 2020-10-13: 2 g via INTRAVENOUS
  Filled 2020-10-13: qty 50

## 2020-10-13 MED ORDER — ASPIRIN EC 81 MG PO TBEC
81.0000 mg | DELAYED_RELEASE_TABLET | Freq: Every day | ORAL | Status: DC
  Administered 2020-10-14 – 2020-10-15 (×2): 81 mg via ORAL
  Filled 2020-10-13 (×2): qty 1

## 2020-10-13 MED ORDER — NICOTINE 14 MG/24HR TD PT24
14.0000 mg | MEDICATED_PATCH | Freq: Every day | TRANSDERMAL | Status: DC
  Administered 2020-10-14 – 2020-10-15 (×2): 14 mg via TRANSDERMAL
  Filled 2020-10-13 (×2): qty 1

## 2020-10-13 MED ORDER — NICOTINE 21 MG/24HR TD PT24: 21.0000 mg | MEDICATED_PATCH | Freq: Every day | TRANSDERMAL | Status: DC

## 2020-10-13 MED ORDER — GABAPENTIN 300 MG PO CAPS
400.0000 mg | ORAL_CAPSULE | Freq: Once | ORAL | Status: AC
  Administered 2020-10-13: 400 mg via ORAL
  Filled 2020-10-13: qty 1

## 2020-10-13 MED ORDER — GABAPENTIN 400 MG PO CAPS
400.0000 mg | ORAL_CAPSULE | Freq: Three times a day (TID) | ORAL | Status: DC
  Administered 2020-10-14 – 2020-10-15 (×5): 400 mg via ORAL
  Filled 2020-10-13 (×5): qty 1

## 2020-10-13 MED ORDER — CALCIUM GLUCONATE-NACL 1-0.675 GM/50ML-% IV SOLN
1.0000 g | Freq: Once | INTRAVENOUS | Status: AC
  Administered 2020-10-13: 1000 mg via INTRAVENOUS
  Filled 2020-10-13: qty 50

## 2020-10-13 MED ORDER — METFORMIN HCL 500 MG PO TABS
1000.0000 mg | ORAL_TABLET | Freq: Two times a day (BID) | ORAL | Status: DC
  Administered 2020-10-14 – 2020-10-15 (×3): 1000 mg via ORAL
  Filled 2020-10-13 (×3): qty 2

## 2020-10-13 MED ORDER — ENOXAPARIN SODIUM 40 MG/0.4ML IJ SOSY
40.0000 mg | PREFILLED_SYRINGE | INTRAMUSCULAR | Status: DC
  Administered 2020-10-14 – 2020-10-15 (×2): 40 mg via SUBCUTANEOUS
  Filled 2020-10-13 (×2): qty 0.4

## 2020-10-13 NOTE — ED Notes (Signed)
Pt back from mri requesting to eat, vss on ccm. NADN.

## 2020-10-13 NOTE — H&P (Incomplete)
Date: 10/13/2020               Patient Name:  [name] MRN: [id number]  DOB: 1951/04/21 Age / Sex: 70 y.o., female   PCP: Jose Persia, MD         Medical Service: Internal Medicine Teaching Service         Attending Physician: Dr. Rise Patience, MD    First Contact: Dr. Rosita Kea Pager: [phone]  Second Contact: Dr. Gilford Rile Pager: [phone]       After Hours (After 5p/  First Contact Pager: [phone]  weekends / holidays): Second Contact Pager: [phone]   Chief Complaint: facial numbness  History of Present Illness:   [name] is a 69 y.o. chronically ill female with iron deficiency anemia requiring multiple iron transfusions and blood transfusions secondary to GI AVMs, diabetes, porphyria cutanea tarda, hypertension, PVD, CVAs, and substance use disorder who presents for facial numbness.  History obtained from the patient and from chart review.  She presented to the ED 10/12/20 for atypical chest pain however refused further workup and elected to leave AMA.  She presents   Increased shortness of breath, no orthopnea, +paroxysmal dyspnea Denies nausea, vomiting, diarrhea, constipation. Endorses poor appetite. Denies dysphagia 2 shots to half gallon of gin and vodka daily. Last drink Tuesday.    ED Course: {NAMES:3044014::"Febrile","Afebrile"} (Tmax ***), BP ***/***, P ***, SpO2 *** on {NAMES:3044014::"room air","*** L  supplemental oxygen"}. CBC ***. {NAMES:3044014::"CMP","BMP"} ***. Imaging***. EKG ***. ***consulted. IMTS consulted for admission.   Lab Orders     Resp Panel by RT-PCR (Flu A&B, Covid) Nasopharyngeal Swab     CBC with Differential     Comprehensive metabolic panel     Magnesium     Magnesium     CBC with Differential     Comprehensive metabolic panel   Meds:  Current Meds  Medication Sig  . acetaminophen (TYLENOL) 500 MG tablet Take 1,000 mg by mouth every 6 (six) hours as needed.  Marland Kitchen amLODipine (NORVASC) 10 MG tablet Take 1 tablet  (10 mg total) by mouth daily.  Marland Kitchen aspirin EC 81 MG tablet Take 1 tablet (81 mg total) by mouth daily.  . cetirizine (ZYRTEC) 10 MG tablet Take 1 tablet (10 mg total) by mouth daily.  . colchicine 0.6 MG tablet Take 1 tablet (0.6 mg total) by mouth as needed (gout).  . diazepam (VALIUM) 5 MG tablet TAKE 1 TABLET (5 MG TOTAL) BY MOUTH AT BEDTIME AS NEEDED FOR ANXIETY. (Patient taking differently: Take 5 mg by mouth at bedtime as needed for anxiety.)  . Dulaglutide 0.75 MG/0.5ML SOPN INJECT 0.75 MG INTO THE SKIN ONCE A WEEK. (Patient taking differently: Inject 0.75 mg into the skin once a week. Sunday)  . feeding supplement, GLUCERNA SHAKE, (GLUCERNA SHAKE) LIQD Take 237 mLs by mouth 3 (three) times daily between meals. (Patient taking differently: Take 237 mLs by mouth 2 (two) times daily between meals.)  . gabapentin (NEURONTIN) 400 MG capsule TAKE 1 TO 2 CAPSULES BY MOUTH 3 TIMES A DAY (Patient taking differently: Take 400-800 mg by mouth 3 (three) times daily.)  . losartan (COZAAR) 100 MG tablet TAKE 1 TABLET (100 MG TOTAL) BY MOUTH DAILY. (Patient taking differently: Take 100 mg by mouth daily.)  . metFORMIN (GLUCOPHAGE) 1000 MG tablet TAKE 1 TABLET BY MOUTH TWICE A DAY WITH A MEAL (Patient taking differently: Take 1,000 mg by mouth daily.)  . omeprazole (PRILOSEC) 20 MG capsule Take 1 capsule (20  mg total) by mouth daily.    Social History: Originally from [place] Lives with ***. Works in ***. ***smoking. ***alcohol. ***cocaine, marijuana, other drugs.   Family History:  Family History  Problem Relation Age of Onset  . Cancer Father        unknown  . Hypertension Mother   . Diverticulitis Mother      Allergies: Allergies as of 10/13/2020 - Review Complete 10/13/2020  Allergen Reaction Noted  . Ciprofloxacin Hives and Swelling 12/25/2011  . Morphine and related Other (See Comments) 12/25/2011  . Penicillins Hives and Swelling   . Codeine Hives and Swelling   . Lisinopril Cough  01/04/2016  . Feraheme [ferumoxytol] Itching 11/17/2012   Past Medical History:  Diagnosis Date  . Allergic rhinitis   . Anemia, iron deficiency 05/03/2011   2/2 GI AVMs - recieves iron infusions as well as periodic red blood cell transfusions  . Anxiety   . Arteriovenous malformation of duodenum 02/25/2015  . CARPAL TUNNEL RELEASE, HX OF 07/22/2008  . Chest pain 10/28/2019  . Colon polyps   . Diabetes mellitus without complication ([place])    type 2  . Difficulty sleeping    takes trazadone for sleep  . Diverticulosis   . Fall 01/03/2020  . GERD (gastroesophageal reflux disease)   . Hepatitis C    in SVR with [facility] 12/2014-02/2015.   Marland Kitchen History of hernia repair 08/18/2012  . History of porphyria 11/30/2014   Porphyria cutanea tarda in setting of chronic Hepatitis C, frequent  IV iron infusions, and alcohol abuse   . Hypertension   . Hypocalcemia 10/28/2019  . Hypokalemia 09/27/2014  . Peripheral vascular disease ([place])   . Stroke [facility]) 2010   no residual deficits  . Substance use disorder    cocaine  . Tachycardia 07/10/2017  . Trichomonas vaginitis 04/16/2019     Review of Systems: A complete ROS was negative except as per HPI.   Physical Exam: Blood pressure (!) 145/81, pulse 84, temperature 98.6 F (37 C), temperature source Oral, resp. rate 18, height 5\' 2"  (1.575 m), weight 72 kg, SpO2 95 %. Constitutional: well-appearing *** lying in bed, in no acute distress HENT: normocephalic atraumatic, mucous membranes moist Eyes: conjunctiva non-erythematous Neck: supple Cardiovascular: regular rate and rhythm, no m/r/g, no lower extremity edema Pulmonary/Chest: normal work of breathing on room air, lungs clear to auscultation bilaterally Abdominal: soft, non-tender, non-distended MSK: normal bulk and tone Neurological: alert & oriented x 3, 5/5 strength in bilateral upper and lower extremities, normal gait Skin: *** Psych: ***   Labs: CBC    Component Value Date/Time   WBC  8.2 10/13/2020 1534   RBC 4.27 10/13/2020 1534   HGB 13.9 10/13/2020 1534   HGB 14.4 10/12/2020 0802   HGB 8.0 (L) 07/10/2017 1639   HGB 11.3 (L) 01/31/2017 1416   HCT 40.7 10/13/2020 1534   HCT 33.8 (L) 08/10/2020 0753   HCT 33.9 (L) 01/31/2017 1416   PLT 242 10/13/2020 1534   PLT 233 10/12/2020 0802   PLT 450 (H) 07/10/2017 1639   MCV 95.3 10/13/2020 1534   MCV 91 07/10/2017 1639   MCV 97.3 01/31/2017 1416   MCH 32.6 10/13/2020 1534   MCHC 34.2 10/13/2020 1534   RDW 13.4 10/13/2020 1534   RDW 18.9 (H) 07/10/2017 1639   RDW 15.2 (H) 01/31/2017 1416   LYMPHSABS 1.6 10/13/2020 1534   LYMPHSABS 1.8 01/31/2017 1416   MONOABS 0.9 10/13/2020 1534   MONOABS 0.9 01/31/2017 1416  EOSABS 0.1 10/13/2020 1534   EOSABS 0.2 01/31/2017 1416   BASOSABS 0.0 10/13/2020 1534   BASOSABS 0.0 01/31/2017 1416     CMP     Component Value Date/Time   NA 143 10/13/2020 1534   NA 140 11/02/2019 1040   NA 141 01/31/2017 1416   K 3.1 (L) 10/13/2020 1534   K 4.4 01/31/2017 1416   CL 108 10/13/2020 1534   CL 104 10/03/2012 1048   CO2 24 10/13/2020 1534   CO2 22 01/31/2017 1416   GLUCOSE 99 10/13/2020 1534   GLUCOSE 104 01/31/2017 1416   GLUCOSE 154 (H) 10/03/2012 1048   BUN 11 10/13/2020 1534   BUN 15 11/02/2019 1040   BUN 14.0 01/31/2017 1416   CREATININE 1.10 (H) 10/13/2020 1534   CREATININE 1.06 (H) 08/24/2020 0927   CREATININE 0.9 01/31/2017 1416   CALCIUM 5.7 (LL) 10/13/2020 1534   CALCIUM 9.0 01/31/2017 1416   PROT 6.2 (L) 10/13/2020 1534   PROT 7.1 01/31/2017 1416   ALBUMIN 3.2 (L) 10/13/2020 1534   ALBUMIN 3.4 (L) 01/31/2017 1416   AST 22 10/13/2020 1534   AST 17 04/06/2020 1023   AST 25 01/31/2017 1416   ALT 16 10/13/2020 1534   ALT 10 04/06/2020 1023   ALT 21 01/31/2017 1416   ALKPHOS 50 10/13/2020 1534   ALKPHOS 71 01/31/2017 1416   BILITOT 0.6 10/13/2020 1534   BILITOT 0.3 04/06/2020 1023   BILITOT 0.26 01/31/2017 1416   GFRNONAA 55 (L) 10/13/2020 1534   GFRNONAA  58 (L) 08/24/2020 0927   GFRNONAA >89 11/30/2014 1608   GFRAA 53 (L) 02/25/2020 1021   GFRAA >89 11/30/2014 1608    Imaging: CT Head Wo Contrast  Result Date: 10/13/2020 CLINICAL DATA:  Facial numbness EXAM: CT HEAD WITHOUT CONTRAST TECHNIQUE: Contiguous axial images were obtained from the base of the skull through the vertex without intravenous contrast. COMPARISON:  None. FINDINGS: Brain: No evidence of acute infarction, hemorrhage, hydrocephalus, extra-axial collection or mass lesion/mass effect. Mild periventricular white matter hypodensity. Vascular: No hyperdense vessel or unexpected calcification. Skull: Normal. Negative for fracture or focal lesion. Sinuses/Orbits: No acute finding. Other: None. IMPRESSION: No acute intracranial pathology. Mild small-vessel white matter disease. Electronically Signed   By: Eddie Candle M.D.   On: 10/13/2020 13:55   MR BRAIN WO CONTRAST  Result Date: 10/13/2020 CLINICAL DATA:  Neuro deficit, acute, stroke suspected; left facial and arm numbness. EXAM: MRI HEAD WITHOUT CONTRAST TECHNIQUE: Multiplanar, multiecho pulse sequences of the brain and surrounding structures were obtained without intravenous contrast. COMPARISON:  Prior head CT examinations 10/13/2020 and earlier. FINDINGS: Brain: Mild cerebral and cerebellar atrophy. Mild to moderate multifocal T2/FLAIR hyperintensity within the cerebral white matter, nonspecific but compatible with chronic small vessel ischemic disease. Minimal chronic small-vessel ischemic changes are also present within the pons. Chronic lacunar infarct within the right cerebellar hemisphere. There are several scattered supratentorial chronic microhemorrhages, nonspecific but likely reflecting sequela of hypertensive microangiopathy. There is no acute infarct. No evidence of intracranial mass. No extra-axial fluid collection. No midline shift. Vascular: Expected proximal arterial flow voids. Skull and upper cervical spine: No focal  marrow lesion. T1 hypointense marrow signal within the calvarium, skull base and visualized cervical spine. Sinuses/Orbits: Visualized orbits show no acute finding. Trace bilateral ethmoid sinus mucosal thickening. Other: Trace fluid within the bilateral mastoid air cells. IMPRESSION: 1. No evidence of acute intracranial abnormality. 2. Mild-to-moderate chronic small vessel ischemic changes within the cerebral white matter, and to a lesser  degree within the pons. 3. Several scattered supratentorial chronic microhemorrhages, nonspecific but likely reflecting sequela of longstanding poorly controlled hypertension. 4. Small chronic lacunar infarct within the right cerebellar hemisphere. 5. Mild generalized parenchymal atrophy. 6. Minimal bilateral ethmoid sinus mucosal thickening. 7. Trace fluid within the bilateral mastoid air cells. 8. T1 hypointense marrow signal within the calvarium, skull base and visualized cervical spine. While these findings can reflect a marrow infiltrative process, the most common causes include chronic anemia, smoking and obesity. Clinical correlation is recommended. Electronically Signed   By: Kellie Simmering DO   On: 10/13/2020 19:59    EKG: EKG Interpretation  Date/Time:  Thursday Oct 13 2020 15:44:35 EDT Ventricular Rate:  82 PR Interval:  205 QRS Duration: 95 QT Interval:  428 QTC Calculation: 500 R Axis:   20 Text Interpretation: Sinus rhythm Borderline prolonged QT interval when compared to prior, longer QTc. No STEMI Confirmed by Antony Blackbird [id number]) on 10/13/2020 5:33:46 PM  Assessment & Plan by Problem: Active Problems:   Tingling in extremities   [name] is a 70 y.o. y.o. {NAMES:3044014::"man","woman"} with pertinent PMH of *** who presented with *** and admitted for ***.    Severe symptomatic electrolyte derangements: hypocalcemia, hypomagnesemia, hypokalemia EKG with a minimally prolonged QTc, facial numbness resolved on admission Suspect that the  electrolyte derangements are multifactorial, including chronic alcohol use disorder that was exacerbated by the initiation of bevacizumab that is being used off label by hematology for recurrent GI bleeds. She started receiving these transfusions back in March and has been experiencing symptoms including cramping and visual changes after each infusion Ddx: based on her depression, may consider ethylene glycol poisioning however less likely. Unlikely to be pancreatitis as she is asymptomatic. No recent blood transfusions to cause a citrate mediated hypocalcemia. Aside from bevacizumab, she is not on any typical offending agents. Hypoparathyroidism, vitamin D deficiency, osteoblastic lesions, and rhabdomyolysis unlikely as well.  Electrolyte repletion started in the ED with 1g calcium gluconate, 400mg  elemental calcium TUMs, 57mEq potassium, 2g Mag sulf bolus and 400mg  mag ox -will monitor closely with frequent RFPs until electrolytes are stable. -replete electrolytes -tele monitoring -check PTH, vit D, lipase, phos, ethylene glycol, RFP now  Alcohol use disorder. Pt's report of alcohol use varied throughout the history taking but seems to be somewhere between 2 shots of hard alcohol per day to a half gallon of gin per day. Polysubstance use disorder. Admits to cocaine, marijuana use -CIWA -TOC consult for substance abuse counseling  Type 2 D -   *** *** -   *** *** -   *** *** -   *** *** -   Diet: {NAMES:3044014::"Normal","Heart Healthy","Carb-Modified","Renal","Carb/Renal","NPO","TPN","Tube Feeds"} VTE: {NAMES:3044014::"Heparin","Enoxaparin","SCDs","DOAC","None"} IVF: {NAMES:3044014::"None","NS","1/2 NS","LR","D5","D10"},{NAMES:3044014::"None","10cc/hr","25cc/hr","50cc/hr","75cc/hr","100cc/hr","110cc/hr","125cc/hr","Bolus"} Code: {NAMES:3044014::"Full","DNR","DNI","DNR/DNI","Comfort Care","Unknown"}  Prior to Admission Living Arrangement: {NAMES:3044014::"Home, living ***","SNF,  ***","Homeless","***"} Anticipated Discharge Location: {NAMES:3044014::"Home","SNF","CIR","TBD","***"} Barriers to Discharge: ***  Dispo: Admit patient to {STATUS:3044014::"Observation with expected length of stay less than 2 midnights.","Inpatient with expected length of stay greater than 2 midnights."}  Signed: Alexandria Lodge, MD Internal Medicine Resident, PGY-1 [facility] Pager: [phone] 9:24 PM, 10/13/2020

## 2020-10-13 NOTE — H&P (Addendum)
Date: 10/13/2020               Patient Name:  [name] MRN: [id number]  DOB: 30-Dec-1950 Age / Sex: 70 y.o., female   PCP: Verdene Lennert, MD         Medical Service: Internal Medicine Teaching Service         Attending Physician: Dr. Charissa Bash    First Contact: Karsten Ro Pager: [phone]  Second Contact: Dr. Dolan Amen Pager: [phone]       After Hours (After 5p/  First Contact Pager: [phone]  weekends / holidays): Second Contact Pager: [phone]   Chief Complaint: face and arm numbness  History of Present Illness:   [name] is a 70 y.o. woman with past medical history of iron deficiency anemia requiring multiple iron and blood transfusions secondary to GI AVMs, type 2 diabetes mellitus, gout, porphyria cutanea tarda resolved after treatment for hepatitis C, hypertension, tobacco use, substance use disorder, depression/anxiety s/p L AKA who presents with face and arm numbness.   History obtained from the patient and from chart review. Of note, patient initially refused to answer questions and expressed frustration that she had not been given anything to eat in the ED.  During a hematology appointment the day prior to admission (5/18) she reported a 2-day history of abdominal pain, nausea, vomiting, and unable to tolerate PO. CBC was normal, iron 74, ferritin 459. She was advised to go to the [facility] ED for further evaluation. At the [facility] ED she reported bilateral chest pain underneath both breasts which was reproducible on exam. She was recommended to get work-up including CXR, EKG, cardiac enzymes however left AMA.   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 States while she was in the ED, she fell back onto her head after her prosthesis got stuck in the wheelchair she was in. Reports when she woke up the morning of admission (5/19), her entire face except for her forehead,  bilateral arms and hands and right foot were numb, and she felt her face twitching. She called the [facility] to report her symptoms and was recommended to present to the [facility] ED.  She reports complete resolution of her previous abdominal pain, nausea, and atypical chest pain. Endorses shortness of breath with exertion over the last couple of days without orthopnea. States she sleeps with her head propped up slightly. Endorses PND. Denies fevers, chills, congestion, dizziness, lightheadedness, diarrhea, constipation, dark or bloody stools, dysuria, hematuria, dysphagia. Early in the interview when she is frustrated states she drinks half a gallon of gin or vodka daily, however later reports she was "being sarcastic" and reports she drinks 1-2 shots of alcohol 3-4 days per week. States she has been drinking less over the last couple of months compared to before. Last drink 2 days prior to admission (5/17).   When asked about recent medication changes, she reports she started a new medication in March 2022 for GI bleeding and has not had dark stools since. On chart review of her hematology visits, she receives IV Feraheme PRN (last dose 09/12/20), blood transfusions PRN (last 08/05/20), and started monoclonal antibody bevacizumab which she received twice, on 08/10/20 and 08/24/20. The bevacizumab was subsequently held after she developed increased BP and proteinuria.   ED Course: Afebrile, BP 130s-140s/70s-90s, P 70s-90s, SpO2 >95% on room air. CBC with WBC 8.2, hemoglobin 13.9. CMP significant for  marked hypocalcemia (5.7), hypokalemia (3.2), bicarb 23, BUN/Cr 11/1.08, glucose 102, AST/ALT/ALP/Tbili wnl. Mg 0.6. EKG with NSR and QTc 500. CTH without acute changes. MRI brain negative for stroke. She was given calcium gluconate 1 g IV, Tums, potassium chloride 10 mEq IV, KlorKon 40 mEq, magnesium 2 g IV, mag oxide 400 mg PO. IMTS consulted for admission.  Meds:  Current Meds  Medication Sig  . acetaminophen (TYLENOL) 500  MG tablet Take 1,000 mg by mouth every 6 (six) hours as needed.  Marland Kitchen amLODipine (NORVASC) 10 MG tablet Take 1 tablet (10 mg total) by mouth daily.  Marland Kitchen aspirin EC 81 MG tablet Take 1 tablet (81 mg total) by mouth daily.  . cetirizine (ZYRTEC) 10 MG tablet Take 1 tablet (10 mg total) by mouth daily.  . colchicine 0.6 MG tablet Take 1 tablet (0.6 mg total) by mouth as needed (gout).  . diazepam (VALIUM) 5 MG tablet TAKE 1 TABLET (5 MG TOTAL) BY MOUTH AT BEDTIME AS NEEDED FOR ANXIETY. (Patient taking differently: Take 5 mg by mouth at bedtime as needed for anxiety.)  . Dulaglutide 0.75 MG/0.5ML SOPN INJECT 0.75 MG INTO THE SKIN ONCE A WEEK. (Patient taking differently: Inject 0.75 mg into the skin once a week. Sunday)  . feeding supplement, GLUCERNA SHAKE, (GLUCERNA SHAKE) LIQD Take 237 mLs by mouth 3 (three) times daily between meals. (Patient taking differently: Take 237 mLs by mouth 2 (two) times daily between meals.)  . gabapentin (NEURONTIN) 400 MG capsule TAKE 1 TO 2 CAPSULES BY MOUTH 3 TIMES A DAY (Patient taking differently: Take 400-800 mg by mouth 3 (three) times daily.)  . losartan (COZAAR) 100 MG tablet TAKE 1 TABLET (100 MG TOTAL) BY MOUTH DAILY. (Patient taking differently: Take 100 mg by mouth daily.)  . metFORMIN (GLUCOPHAGE) 1000 MG tablet TAKE 1 TABLET BY MOUTH TWICE A DAY WITH A MEAL (Patient taking differently: Take 1,000 mg by mouth daily.)  . omeprazole (PRILOSEC) 20 MG capsule Take 1 capsule (20 mg total) by mouth daily.    Social History: Lives with her friend/signifcant other whom she has been with for 6 years. Has 3 children, 1 of whom is living and is in the area. Smokes ~5 cigarettes daily. Smokes crack cocaine ~1x/week and marijuana "when feeling depressed." Denies current or previous IV drug use. States she would like help quitting illicit drug use.  Family History:  Family History  Problem Relation Age of Onset  . Cancer Father        unknown  . Hypertension Mother   .  Diverticulitis Mother     Allergies: Allergies as of 10/13/2020 - Review Complete 10/13/2020  Allergen Reaction Noted  . Ciprofloxacin Hives and Swelling 12/25/2011  . Morphine and related Other (See Comments) 12/25/2011  . Penicillins Hives and Swelling   . Codeine Hives and Swelling   . Lisinopril Cough 01/04/2016  . Feraheme [ferumoxytol] Itching 11/17/2012   Past Medical History:  Diagnosis Date  . Allergic rhinitis   . Anemia, iron deficiency 05/03/2011   2/2 GI AVMs - recieves iron infusions as well as periodic red blood cell transfusions  . Anxiety   . Arteriovenous malformation of duodenum 02/25/2015  . CARPAL TUNNEL RELEASE, HX OF 07/22/2008  . Chest pain 10/28/2019  . Colon polyps   . Diabetes mellitus without complication ([place])    type 2  . Difficulty sleeping    takes trazadone for sleep  . Diverticulosis   . Fall 01/03/2020  . GERD (gastroesophageal reflux  disease)   . Hepatitis C    in SVR with Harvoni 12/2014-02/2015.   Marland Kitchen History of hernia repair 08/18/2012  . History of porphyria 11/30/2014   Porphyria cutanea tarda in setting of chronic Hepatitis C, frequent  IV iron infusions, and alcohol abuse   . Hypertension   . Hypocalcemia 10/28/2019  . Hypokalemia 09/27/2014  . Peripheral vascular disease ([place])   . Stroke [facility]) 2010   no residual deficits  . Substance use disorder    cocaine  . Tachycardia 07/10/2017  . Trichomonas vaginitis 04/16/2019   Review of Systems: A complete ROS was negative except as per HPI.   Physical Exam: Blood pressure (!) 145/81, pulse 84, temperature 98.6 F (37 C), temperature source Oral, resp. rate 18, height 5\' 2"  (1.575 m), weight 72 kg, SpO2 95 %. Constitutional: tired-appearing woman lying in bed, in no acute distress, initially very frustrated and turned away from this interviewer, however later becomes very engaged, pleasant, and talkative HENT: normocephalic atraumatic, mucous membranes moist Eyes: conjunctiva  non-erythematous Neck: supple Cardiovascular: regular rate and rhythm, systolic murmur heart best over the RUSB, no lower extremity edema in RLE or left lower extremity s/p L AKA Pulmonary/Chest: normal work of breathing on room air, lungs clear to auscultation bilaterally, no wheezes, rales, or rhonchi Abdominal: soft, mild left upper quadrant tenderness to palpation, non-distended; hernia repair scar below umbilicus MSK: normal bulk and tone, s/p L AKA Neurological: alert & oriented x 3; PERRL, EOMs intact, no facial droop. Decreased sensation of face except for forehead L>R. Tongue is tremulous with extension. 5/5 strength in bilateral upper extremities except 3/5 strength in bilateral hands, 5/5 strength in bilateral lower extremities. Decreased sensation in bilateral arms and R foot.  Skin: warm and dry, hernia repair scar on abdomen as above Psych: becomes tearful when told seriousness of condition, states mood is good, normal affect  Labs: CBC    Component Value Date/Time   WBC 8.2 10/13/2020 1534   RBC 4.27 10/13/2020 1534   HGB 13.9 10/13/2020 1534   HGB 14.4 10/12/2020 0802   HGB 8.0 (L) 07/10/2017 1639   HGB 11.3 (L) 01/31/2017 1416   HCT 40.7 10/13/2020 1534   HCT 33.8 (L) 08/10/2020 0753   HCT 33.9 (L) 01/31/2017 1416   PLT 242 10/13/2020 1534   PLT 233 10/12/2020 0802   PLT 450 (H) 07/10/2017 1639   MCV 95.3 10/13/2020 1534   MCV 91 07/10/2017 1639   MCV 97.3 01/31/2017 1416   MCH 32.6 10/13/2020 1534   MCHC 34.2 10/13/2020 1534   RDW 13.4 10/13/2020 1534   RDW 18.9 (H) 07/10/2017 1639   RDW 15.2 (H) 01/31/2017 1416   LYMPHSABS 1.6 10/13/2020 1534   LYMPHSABS 1.8 01/31/2017 1416   MONOABS 0.9 10/13/2020 1534   MONOABS 0.9 01/31/2017 1416   EOSABS 0.1 10/13/2020 1534   EOSABS 0.2 01/31/2017 1416   BASOSABS 0.0 10/13/2020 1534   BASOSABS 0.0 01/31/2017 1416     CMP     Component Value Date/Time   NA 143 10/13/2020 1534   NA 140 11/02/2019 1040   NA 141  01/31/2017 1416   K 3.1 (L) 10/13/2020 1534   K 4.4 01/31/2017 1416   CL 108 10/13/2020 1534   CL 104 10/03/2012 1048   CO2 24 10/13/2020 1534   CO2 22 01/31/2017 1416   GLUCOSE 99 10/13/2020 1534   GLUCOSE 104 01/31/2017 1416   GLUCOSE 154 (H) 10/03/2012 1048   BUN 11 10/13/2020 1534  BUN 15 11/02/2019 1040   BUN 14.0 01/31/2017 1416   CREATININE 1.10 (H) 10/13/2020 1534   CREATININE 1.06 (H) 08/24/2020 0927   CREATININE 0.9 01/31/2017 1416   CALCIUM 5.7 (LL) 10/13/2020 1534   CALCIUM 9.0 01/31/2017 1416   PROT 6.2 (L) 10/13/2020 1534   PROT 7.1 01/31/2017 1416   ALBUMIN 3.2 (L) 10/13/2020 1534   ALBUMIN 3.4 (L) 01/31/2017 1416   AST 22 10/13/2020 1534   AST 17 04/06/2020 1023   AST 25 01/31/2017 1416   ALT 16 10/13/2020 1534   ALT 10 04/06/2020 1023   ALT 21 01/31/2017 1416   ALKPHOS 50 10/13/2020 1534   ALKPHOS 71 01/31/2017 1416   BILITOT 0.6 10/13/2020 1534   BILITOT 0.3 04/06/2020 1023   BILITOT 0.26 01/31/2017 1416   GFRNONAA 55 (L) 10/13/2020 1534   GFRNONAA 58 (L) 08/24/2020 0927   GFRNONAA >89 11/30/2014 1608   GFRAA 53 (L) 02/25/2020 1021   GFRAA >89 11/30/2014 1608    Imaging: CT Head Wo Contrast  Result Date: 10/13/2020 CLINICAL DATA:  Facial numbness EXAM: CT HEAD WITHOUT CONTRAST TECHNIQUE: Contiguous axial images were obtained from the base of the skull through the vertex without intravenous contrast. COMPARISON:  None. FINDINGS: Brain: No evidence of acute infarction, hemorrhage, hydrocephalus, extra-axial collection or mass lesion/mass effect. Mild periventricular white matter hypodensity. Vascular: No hyperdense vessel or unexpected calcification. Skull: Normal. Negative for fracture or focal lesion. Sinuses/Orbits: No acute finding. Other: None. IMPRESSION: No acute intracranial pathology. Mild small-vessel white matter disease. Electronically Signed   By: Eddie Candle M.D.   On: 10/13/2020 13:55   MR BRAIN WO CONTRAST  Result Date:  10/13/2020 CLINICAL DATA:  Neuro deficit, acute, stroke suspected; left facial and arm numbness. EXAM: MRI HEAD WITHOUT CONTRAST TECHNIQUE: Multiplanar, multiecho pulse sequences of the brain and surrounding structures were obtained without intravenous contrast. COMPARISON:  Prior head CT examinations 10/13/2020 and earlier. FINDINGS: Brain: Mild cerebral and cerebellar atrophy. Mild to moderate multifocal T2/FLAIR hyperintensity within the cerebral white matter, nonspecific but compatible with chronic small vessel ischemic disease. Minimal chronic small-vessel ischemic changes are also present within the pons. Chronic lacunar infarct within the right cerebellar hemisphere. There are several scattered supratentorial chronic microhemorrhages, nonspecific but likely reflecting sequela of hypertensive microangiopathy. There is no acute infarct. No evidence of intracranial mass. No extra-axial fluid collection. No midline shift. Vascular: Expected proximal arterial flow voids. Skull and upper cervical spine: No focal marrow lesion. T1 hypointense marrow signal within the calvarium, skull base and visualized cervical spine. Sinuses/Orbits: Visualized orbits show no acute finding. Trace bilateral ethmoid sinus mucosal thickening. Other: Trace fluid within the bilateral mastoid air cells. IMPRESSION: 1. No evidence of acute intracranial abnormality. 2. Mild-to-moderate chronic small vessel ischemic changes within the cerebral white matter, and to a lesser degree within the pons. 3. Several scattered supratentorial chronic microhemorrhages, nonspecific but likely reflecting sequela of longstanding poorly controlled hypertension. 4. Small chronic lacunar infarct within the right cerebellar hemisphere. 5. Mild generalized parenchymal atrophy. 6. Minimal bilateral ethmoid sinus mucosal thickening. 7. Trace fluid within the bilateral mastoid air cells. 8. T1 hypointense marrow signal within the calvarium, skull base and  visualized cervical spine. While these findings can reflect a marrow infiltrative process, the most common causes include chronic anemia, smoking and obesity. Clinical correlation is recommended. Electronically Signed   By: Kellie Simmering DO   On: 10/13/2020 19:59    EKG: EKG Interpretation  Date/Time:  Thursday Oct 13 2020 15:44:35  EDT Ventricular Rate:  82 PR Interval:  205 QRS Duration: 95 QT Interval:  428 QTC Calculation: 500 R Axis:   20 Text Interpretation: Sinus rhythm Borderline prolonged QT interval when compared to prior, longer QTc. No STEMI Confirmed by Antony Blackbird [phone]) on 10/13/2020 5:33:46 PM  Assessment & Plan by Problem: Principal Problem:   Tingling in extremities Active Problems:   Hypokalemia   Hypocalcemia   Hypomagnesemia  [name] is a 70 y.o. woman with past medical history of iron deficiency anemia requiring multiple iron and blood transfusions secondary to GI AVMs, type 2 diabetes mellitus, gout, porphyria cutanea tarda resolved after treatment for hepatitis C, hypertension, tobacco use, substance use disorder, s/p L BKA who presents with face and arm numbness and admitted for further evaluation and treatment of severe symptomatic electrolyte derangements.   Severe symptomatic electrolyte derangements: hypocalcemia, hypomagnesemia, hypokalemia Ca 5.7, Mg 0.6, K 3.2 in the ED. EKG with a minimally prolonged QTc (500), facial and arm numbness significantly improved after initiation of treatment in ED. Suspect the electrolyte derangements are multifactorial, including chronic alcohol use which was exacerbated by the initiation of bevacizumab, which can cause hypomagnesemia and hypokalemia, being used off-label by hematology for recurrent GI bleeding. She started receiving these transfusions in March 2022, last dose 08/24/20, and has been experiencing symptoms including cramping and visual changes after each infusion. Unlikely to be pancreatitis as she is  asymptomatic. No recent blood transfusions to cause a citrate-mediated hypocalcemia. Given her history of depression, may consider ethylene glycol poisioning however less likely. Aside from bevacizumab, she is not on any typical offending agents. Hypoparathyroidism, vitamin D deficiency, osteoblastic lesions, and rhabdomyolysis unlikely as well. Electrolyte repletion started in the ED with 1g calcium gluconate, 400mg  elemental calcium TUMs, 50 mEq potassium, 2g Mag sulf bolus and 400mg  mag ox. - will monitor closely with frequent RFPs until electrolytes are stable - replete electrolytes - tele monitoring - check PTH, vit D, lipase, ethylene glycol, renal function panel (includes K, Mg, phos) now  Alcohol use disorder. Pt's report of alcohol use varied throughout the history taking but seems to be somewhere around 2 shots of hard alcohol per 3-4 days.  Polysubstance use disorder. Reports smoking crack cocaine and marijuana. States she would like to speak with social work for substance abuse resources. - CIWA - TOC consult for substance abuse counseling  Type 2 diabetes mellitus Last A1c 4% on 07/07/20. Suspected to be falsely low in setting of chronic blood loss anemia. On Trulicity [id number] mg once weekly (Sunday evenings) and metformin 1000 mg twice daily. Serum glucose 102 on admission. - continue home metformin, she will be due for Trulicity on [id number]  Hypertension On amlodipine 10 mg daily and losartan 100 mg daily. Slightly hypertensive on admission. - continue home amlodipine 10 mg daily and losartan 100 mg daily  History of depression and anxiety Patient initially frustrated during interview and unwilling to answer questions because she was hungry. Later she became tearful when counseled regarding the seriousness of her electrolyte derangements. Her affect changed dramatically over the course of the interview, and she became very pleasant and talkative. States her mood is good and discusses how  happy she is in her relationship of 6 years, has a good support system, etc. Denies SI. On chart review, she was seen in [facility] in February for grief relating to her sister's death and was prescribed valium which she reports she did not tolerate. Previously prescribed fluoxetine which patient states she can't  remember why she stopped taking.   - Discussed option of referral to [facility] counselor   Diet: Normal VTE: Enoxaparin IVF: None Code: Full  Prior to Admission Living Arrangement: Home Anticipated Discharge Location: Home Barriers to Discharge: correction and stabilization of electrolyte derangements  Dispo: Admit patient to Inpatient with expected length of stay greater than 2 midnights.  Signed: Alexandria Lodge, MD Internal Medicine Resident, PGY-1 [facility] Pager: [phone] 9:24 PM, 10/13/2020

## 2020-10-13 NOTE — ED Notes (Signed)
Attempted to call report to 6N. Secretary provided number for nurse. Nurse contacted and she advised she was with a pt and would call back.

## 2020-10-13 NOTE — ED Notes (Signed)
Pt to MRI

## 2020-10-13 NOTE — ED Notes (Signed)
Pt refusing medications, states she will leave unless she receives her gabapentin. Provider to be notified.

## 2020-10-13 NOTE — Telephone Encounter (Signed)
I agree with ED eval ASAP

## 2020-10-13 NOTE — ED Notes (Signed)
Pt eating graham crackers at bedside and is speaking to family on cell phone, NADN, sinus rhythm on cardiac monitor. Call bell in reach.

## 2020-10-13 NOTE — ED Notes (Signed)
PA Belaya aware of Calcium and Magnesium level.

## 2020-10-13 NOTE — ED Provider Notes (Signed)
[facility] EMERGENCY DEPARTMENT Provider Note   CSN: [id number] Arrival date & time: 10/13/20  1212     History Chief Complaint  Patient presents with  . Numbness    [name] is a 70 y.o. female.  HPI Patient presents with numbness.  Present on the left side of the face.  States that she fell yesterday and is started at that time and feels like diminished sensation compared to the right.  No pain, no blurry vision, no headache.  No neck pain.  She was seen by her doctor at the cancer center yesterday who sent her to the ER because she complained of abdominal pain and vomiting for two days.  When she went to the ER she complained of chest pain and refused any testing and went home AMA.  Today she states she has no abdominal pain and has been tolerating water but has not had anything to eat for 2 days because she is worried she will throw up, and says her last episode of chest pain was this morning and felt like a sharp sensation in the middle and left side of her chest was nonradiating and not worse with exertion and she has had this off and on for a long time.  Her last bowel movement was yesterday and was normal without blood or dark black.  Her emesis is described as nonbloody nonbilious.  She also complains of tingling in her fingertips and her left foot which started over the past day.  She has a history of porphyria cutanea tarda which resolved after hep C treatment and has chronic anemia for which she gets IV Feraheme.    Past Medical History:  Diagnosis Date  . Allergic rhinitis   . Anemia, iron deficiency 05/03/2011   2/2 GI AVMs - recieves iron infusions as well as periodic red blood cell transfusions  . Anxiety   . Arteriovenous malformation of duodenum 02/25/2015  . CARPAL TUNNEL RELEASE, HX OF 07/22/2008  . Chest pain 10/28/2019  . Colon polyps   . Diabetes mellitus without complication ([place])    type 2  . Difficulty sleeping    takes trazadone for  sleep  . Diverticulosis   . Fall 01/03/2020  . GERD (gastroesophageal reflux disease)   . Hepatitis C    in SVR with Harvoni 12/2014-02/2015.   Marland Kitchen History of hernia repair 08/18/2012  . History of porphyria 11/30/2014   Porphyria cutanea tarda in setting of chronic Hepatitis C, frequent  IV iron infusions, and alcohol abuse   . Hypertension   . Hypocalcemia 10/28/2019  . Hypokalemia 09/27/2014  . Peripheral vascular disease ([place])   . Stroke [facility]) 2010   no residual deficits  . Substance use disorder    cocaine  . Tachycardia 07/10/2017  . Trichomonas vaginitis 04/16/2019    Patient Active Problem List   Diagnosis Date Noted  . Hypomagnesemia 10/14/2020  . Tingling in extremities 10/13/2020  . Hypocalcemia 10/13/2020  . Abdominal pain 10/12/2020  . Grief 07/21/2020  . GI bleed 07/07/2020  . Urinary incontinence 02/08/2020  . Spleen hematoma 11/02/2019  . Microscopic hematuria 07/17/2019  . Sleep disorder 02/04/2019  . Kidney disease 02/04/2019  . Tinea corporis 06/26/2018  . Generalized anxiety disorder 07/24/2017  . Tobacco use disorder 07/24/2017  . Atherosclerosis of abdominal aorta ([place]) 10/25/2016  . Gout involving toe of right foot 02/28/2016  . Alcohol use 07/16/2015  . History of hepatitis C 03/15/2015  . Substance use disorder 03/05/2015  .  Iron deficiency anemia due to chronic blood loss 02/23/2015  . Vitamin D deficiency 12/02/2014  . Preventative health care 11/30/2014  . Hypokalemia 09/27/2014  . Depression 07/10/2013  . Type 2 diabetes mellitus with diabetic neuropathy ([place]) 06/07/2013  . Above knee amputation of left lower extremity ([place]) 08/18/2012  . Angiodysplasia of duodenum 08/03/2011  . Essential hypertension, benign 07/22/2008  . GERD 07/22/2008    Past Surgical History:  Procedure Laterality Date  . ABDOMINAL HYSTERECTOMY    . APPLICATION OF WOUND VAC N/A 06/21/2014   Procedure: APPLICATION OF WOUND VAC;  Surgeon: Coralie Keens, MD;  Location: [place];  Service: General;  Laterality: N/A;  . CARPAL TUNNEL RELEASE     rt hand  . COLONOSCOPY WITH PROPOFOL N/A 03/17/2018   Procedure: COLONOSCOPY WITH PROPOFOL;  Surgeon: Doran Stabler, MD;  Location: [facility];  Service: Gastroenterology;  Laterality: N/A;  . ENTEROSCOPY N/A 12/04/2012   Procedure: ENTEROSCOPY;  Surgeon: Beryle Beams, MD;  [facility];  Service: Endoscopy;  Laterality: N/A;  . ENTEROSCOPY N/A 12/18/2012   Procedure: ENTEROSCOPY;  Surgeon: Beryle Beams, MD;  [facility];  Service: Endoscopy;  Laterality: N/A;  . ENTEROSCOPY N/A 02/25/2015   Procedure: ENTEROSCOPY;  Surgeon: Inda Castle, MD;  Location: [facility] ENDOSCOPY;  Service: Endoscopy;  Laterality: N/A;  . ENTEROSCOPY N/A 10/29/2017   Procedure: ENTEROSCOPY;  Surgeon: Doran Stabler, MD;  [facility];  Service: Gastroenterology;  Laterality: N/A;  . ENTEROSCOPY N/A 07/08/2020   Procedure: ENTEROSCOPY;  Surgeon: Milus Banister, MD;  [facility];  Service: Endoscopy;  Laterality: N/A;  . ESOPHAGOGASTRODUODENOSCOPY  05/05/2011   Procedure: ESOPHAGOGASTRODUODENOSCOPY (EGD);  Surgeon: Zenovia Jarred, MD;  Location[facility];  Service: Gastroenterology;  Laterality: N/A;  Dr. Hilarie Fredrickson will do procedure for Dr. Benson Norway Saturday.  . ESOPHAGOGASTRODUODENOSCOPY  05/08/2011   Procedure: ESOPHAGOGASTRODUODENOSCOPY (EGD);  Surgeon: Beryle Beams;  [facility];  Service: Endoscopy;  Laterality: N/A;  . ESOPHAGOGASTRODUODENOSCOPY  06/07/2011   Procedure: ESOPHAGOGASTRODUODENOSCOPY (EGD);  Surgeon: Beryle Beams, MD;  Location: [facility];  Service: Endoscopy;  Laterality: N/A;  . ESOPHAGOGASTRODUODENOSCOPY  12/20/2011   Procedure: ESOPHAGOGASTRODUODENOSCOPY (EGD);  Surgeon: Beryle Beams, MD;  Location[facility];  Service: Endoscopy;  Laterality: N/A;  . EYE SURGERY Bilateral    cataracts  . FLEXIBLE SIGMOIDOSCOPY  12/21/2011   Procedure: FLEXIBLE SIGMOIDOSCOPY;   Surgeon: Beryle Beams, MD;  [facility];  Service: Endoscopy;  Laterality: N/A;  . HOT HEMOSTASIS  06/07/2011   Procedure: HOT HEMOSTASIS (ARGON PLASMA COAGULATION/BICAP);  Surgeon: Beryle Beams, MD;  Location[facility];  Service: Endoscopy;  Laterality: N/A;  . HOT HEMOSTASIS N/A 07/08/2020   Procedure: HOT HEMOSTASIS (ARGON PLASMA COAGULATION/BICAP);  Surgeon: Milus Banister, MD;  Location[facility];  Service: Endoscopy;  Laterality: N/A;  . INCISIONAL HERNIA REPAIR N/A 06/01/2014   Procedure: ATTEMPTED LAPAROSCOPIC AND OPEN INCISIONAL HERNIA REPAIR WITH MESH;  Surgeon: Coralie Keens, MD;  Location: [place];  Service: General;  Laterality: N/A;  . INSERTION OF MESH N/A 06/01/2014   Procedure: INSERTION OF MESH;  Surgeon: Coralie Keens, MD;  Location: [facility];  Service: General;  Laterality: N/A;  . LAPAROTOMY  02/16/2012   Procedure: EXPLORATORY LAPAROTOMY;  Surgeon: Edward Jolly, MD;  [facility];  Service: General;  Laterality: N/A;  oversewing of anastomotic leak and rigid sigmoidoscopy  . LAPAROTOMY N/A 06/21/2014   Procedure: ABDOMINAL WOUND EXPLORATION;  Surgeon: Nathaneil Canary  Ninfa Linden, MD;  Location: [facility];  Service: General;  Laterality: N/A;  . LEG AMPUTATION ABOVE KNEE     left  . PARTIAL COLECTOMY  02/15/2012   Procedure: PARTIAL COLECTOMY;  Surgeon: Harl Bowie, MD;  [facility];  Service: General;  Laterality: N/A;  . SUBMUCOSAL INJECTION  07/08/2020   Procedure: SUBMUCOSAL INJECTION;  Surgeon: Milus Banister, MD;  [facility];  Service: Endoscopy;;  . TONSILLECTOMY       OB History   No obstetric history on file.     Family History  Problem Relation Age of Onset  . Cancer Father        unknown  . Hypertension Mother   . Diverticulitis Mother     Social History   Tobacco Use  . Smoking status: Current Some Day Smoker    Packs/day: 0.50    Years: 17.00    Pack years: 8.50    Types: Cigarettes  . Smokeless  tobacco: Never Used  . Tobacco comment: 1 pack last 1 month  Vaping Use  . Vaping Use: Never used  Substance Use Topics  . Alcohol use: Yes    Alcohol/week: 0.0 standard drinks  . Drug use: Yes    Types: Cocaine, Marijuana    Comment: Sometimes.    Home Medications Prior to Admission medications   Medication Sig Start Date End Date Taking? Authorizing Provider  acetaminophen (TYLENOL) 500 MG tablet Take 1,000 mg by mouth every 6 (six) hours as needed.   Yes [provider]  amLODipine (NORVASC) 10 MG tablet Take 1 tablet (10 mg total) by mouth daily. 07/21/20  Yes Jose Persia, MD  aspirin EC 81 MG tablet Take 1 tablet (81 mg total) by mouth daily. 03/04/15  Yes Burgess Estelle, MD  cetirizine (ZYRTEC) 10 MG tablet Take 1 tablet (10 mg total) by mouth daily. 09/06/20  Yes Jeralyn Bennett, MD  colchicine 0.6 MG tablet Take 1 tablet (0.6 mg total) by mouth as needed (gout). 08/09/20  Yes Sanjuan Dame, MD  diazepam (VALIUM) 5 MG tablet TAKE 1 TABLET (5 MG TOTAL) BY MOUTH AT BEDTIME AS NEEDED FOR ANXIETY. Patient taking differently: Take 5 mg by mouth at bedtime as needed for anxiety. 07/21/20 01/17/21 Yes Jose Persia, MD  Dulaglutide 0.75 MG/0.5ML SOPN INJECT 0.75 MG INTO THE SKIN ONCE A WEEK. Patient taking differently: Inject 0.75 mg into the skin once a week. Sunday 07/21/20 07/21/21 Yes Jose Persia, MD  feeding supplement, GLUCERNA SHAKE, (GLUCERNA SHAKE) LIQD Take 237 mLs by mouth 3 (three) times daily between meals. Patient taking differently: Take 237 mLs by mouth 2 (two) times daily between meals. 10/04/16  Yes [name], MD  gabapentin (NEURONTIN) 400 MG capsule TAKE 1 TO 2 CAPSULES BY MOUTH 3 TIMES A DAY Patient taking differently: Take 400-800 mg by mouth 3 (three) times daily. 08/10/20  Yes Sanjuan Dame, MD  losartan (COZAAR) 100 MG tablet TAKE 1 TABLET (100 MG TOTAL) BY MOUTH DAILY. Patient taking differently: Take 100 mg by mouth daily. 07/21/20  07/21/21 Yes Jose Persia, MD  metFORMIN (GLUCOPHAGE) 1000 MG tablet TAKE 1 TABLET BY MOUTH TWICE A DAY WITH A MEAL Patient taking differently: Take 1,000 mg by mouth daily. 04/14/20  Yes Katsadouros, Vasilios, MD  omeprazole (PRILOSEC) 20 MG capsule Take 1 capsule (20 mg total) by mouth daily. 04/18/20  Yes Jose Persia, MD  ACCU-CHEK FASTCLIX LANCETS MISC Use to test blood glucose 3 times daily. 10/04/16   [name], MD  allopurinol (ZYLOPRIM) 100 MG tablet TAKE 1 TABLET (100 MG TOTAL) BY MOUTH DAILY. Patient not taking: Reported on 10/13/2020 10/23/19   Jose Persia, MD  Blood Glucose Monitoring Suppl (ACCU-CHEK GUIDE) w/Device KIT 1 each by Does not apply route 3 (three) times daily. The patient is insulin requiring, ICD 10 code E11.65. The patient tests 3 times a day. 02/05/19   Jose Persia, MD  glucose blood (ACCU-CHEK GUIDE) test strip USE TO TEST BLOOD SUGAR THREE TIMES A DAY 06/17/20   Harvie Heck, MD    Allergies    Ciprofloxacin, Morphine and related, Penicillins, Codeine, Lisinopril, and Feraheme [ferumoxytol]  Review of Systems   Review of Systems  Constitutional: Negative for chills and fever.  HENT: Negative for ear pain and sore throat.   Eyes: Negative for pain and visual disturbance.  Respiratory: Negative for cough and shortness of breath.   Cardiovascular: Positive for chest pain. Negative for palpitations and leg swelling.  Gastrointestinal: Positive for vomiting. Negative for abdominal pain.  Genitourinary: Negative for dysuria and hematuria.  Musculoskeletal: Negative for arthralgias and back pain.  Skin: Negative for color change and rash.  Neurological: Positive for numbness. Negative for seizures and syncope.  All other systems reviewed and are negative.   Physical Exam Updated Vital Signs BP 133/71 (BP Location: Right Arm)   Pulse 80   Temp 99.1 F (37.3 C) (Oral)   Resp 17   Ht $R'5\' 2"'TZ$  (1.575 m)   Wt 66.7 kg   SpO2 97%   BMI 26.90 kg/m    Physical Exam Vitals and nursing note reviewed.  Constitutional:      General: She is not in acute distress.    Appearance: She is well-developed.  HENT:     Head: Normocephalic and atraumatic.  Eyes:     Conjunctiva/sclera: Conjunctivae normal.  Cardiovascular:     Rate and Rhythm: Normal rate and regular rhythm.     Heart sounds: No murmur heard.   Pulmonary:     Effort: Pulmonary effort is normal. No respiratory distress.     Breath sounds: Normal breath sounds.  Abdominal:     Palpations: Abdomen is soft.     Tenderness: There is no abdominal tenderness.  Musculoskeletal:     Cervical back: Neck supple. No tenderness.     Right lower leg: No edema.     Comments: L AKA  Skin:    General: Skin is warm and dry.  Neurological:     Mental Status: She is alert.     Comments:   MENTAL STATUS: AAOx3   LANG/SPEECH: Fluent, intact medical history with good comprehension   CRANIAL NERVES:   II: Pupils equal and reactive   III, IV, VI: EOM intact, no gaze preference or deviation   V: left sided diminished sensation in V1-V3 compared to R, sensation grossly intact   VII: no facial asymmetry   VIII: normal hearing to speech   MOTOR: 5/5 in both upper and lower extremities   SENSORY: Equal and symmetric in bilateral upper extremities, intact in RLE   COORD: Normal finger to nose, no tremor, no dysmetria  Psychiatric:        Behavior: Behavior normal.        Judgment: Judgment normal.     ED Results / Procedures / Treatments   Labs (all labs ordered are listed, but only abnormal results are displayed) Labs Reviewed  COMPREHENSIVE METABOLIC PANEL - Abnormal; Notable for the following components:      Result  Value   Potassium 3.2 (*)    Glucose, Bld 102 (*)    Creatinine, Ser 1.08 (*)    Calcium 5.7 (*)    Albumin 3.4 (*)    GFR, Estimated 56 (*)    All other components within normal limits  MAGNESIUM - Abnormal; Notable for the following components:   Magnesium 0.6  (*)    All other components within normal limits  MAGNESIUM - Abnormal; Notable for the following components:   Magnesium 0.6 (*)    All other components within normal limits  COMPREHENSIVE METABOLIC PANEL - Abnormal; Notable for the following components:   Potassium 3.1 (*)    Creatinine, Ser 1.10 (*)    Calcium 5.7 (*)    Total Protein 6.2 (*)    Albumin 3.2 (*)    GFR, Estimated 55 (*)    All other components within normal limits  CBC - Abnormal; Notable for the following components:   RBC 3.85 (*)    All other components within normal limits  COMPREHENSIVE METABOLIC PANEL - Abnormal; Notable for the following components:   Potassium 3.0 (*)    Glucose, Bld 188 (*)    Creatinine, Ser 1.05 (*)    Calcium 5.9 (*)    Total Protein 5.5 (*)    Albumin 2.8 (*)    Total Bilirubin 0.2 (*)    GFR, Estimated 58 (*)    All other components within normal limits  VITAMIN D 25 HYDROXY (VIT D DEFICIENCY, FRACTURES) - Abnormal; Notable for the following components:   Vit D, 25-Hydroxy 22.76 (*)    All other components within normal limits  LIPASE, BLOOD - Abnormal; Notable for the following components:   Lipase 111 (*)    All other components within normal limits  MAGNESIUM - Abnormal; Notable for the following components:   Magnesium 1.0 (*)    All other components within normal limits  RESP PANEL BY RT-PCR (FLU A&B, COVID) ARPGX2  CBC WITH DIFFERENTIAL/PLATELET  CBC WITH DIFFERENTIAL/PLATELET  PHOSPHORUS  ETHANOL  ETHYLENE GLYCOL  HEMOGLOBIN A1C  PTH, INTACT AND CALCIUM  RAPID URINE DRUG SCREEN, HOSP PERFORMED  BASIC METABOLIC PANEL  MAGNESIUM  BASIC METABOLIC PANEL  MAGNESIUM  TROPONIN I (HIGH SENSITIVITY)    EKG EKG Interpretation  Date/Time:  Thursday Oct 13 2020 15:44:35 EDT Ventricular Rate:  82 PR Interval:  205 QRS Duration: 95 QT Interval:  428 QTC Calculation: 500 R Axis:   20 Text Interpretation: Sinus rhythm Borderline prolonged QT interval when compared  to prior, longer QTc. No STEMI Confirmed by Antony Blackbird [phone]) on 10/13/2020 5:33:46 PM   Radiology CT Head Wo Contrast  Result Date: 10/13/2020 CLINICAL DATA:  Facial numbness EXAM: CT HEAD WITHOUT CONTRAST TECHNIQUE: Contiguous axial images were obtained from the base of the skull through the vertex without intravenous contrast. COMPARISON:  None. FINDINGS: Brain: No evidence of acute infarction, hemorrhage, hydrocephalus, extra-axial collection or mass lesion/mass effect. Mild periventricular white matter hypodensity. Vascular: No hyperdense vessel or unexpected calcification. Skull: Normal. Negative for fracture or focal lesion. Sinuses/Orbits: No acute finding. Other: None. IMPRESSION: No acute intracranial pathology. Mild small-vessel white matter disease. Electronically Signed   By: Eddie Candle M.D.   On: 10/13/2020 13:55   MR BRAIN WO CONTRAST  Result Date: 10/13/2020 CLINICAL DATA:  Neuro deficit, acute, stroke suspected; left facial and arm numbness. EXAM: MRI HEAD WITHOUT CONTRAST TECHNIQUE: Multiplanar, multiecho pulse sequences of the brain and surrounding structures were obtained without intravenous contrast. COMPARISON:  Prior head CT examinations 10/13/2020 and earlier. FINDINGS: Brain: Mild cerebral and cerebellar atrophy. Mild to moderate multifocal T2/FLAIR hyperintensity within the cerebral white matter, nonspecific but compatible with chronic small vessel ischemic disease. Minimal chronic small-vessel ischemic changes are also present within the pons. Chronic lacunar infarct within the right cerebellar hemisphere. There are several scattered supratentorial chronic microhemorrhages, nonspecific but likely reflecting sequela of hypertensive microangiopathy. There is no acute infarct. No evidence of intracranial mass. No extra-axial fluid collection. No midline shift. Vascular: Expected proximal arterial flow voids. Skull and upper cervical spine: No focal marrow lesion. T1  hypointense marrow signal within the calvarium, skull base and visualized cervical spine. Sinuses/Orbits: Visualized orbits show no acute finding. Trace bilateral ethmoid sinus mucosal thickening. Other: Trace fluid within the bilateral mastoid air cells. IMPRESSION: 1. No evidence of acute intracranial abnormality. 2. Mild-to-moderate chronic small vessel ischemic changes within the cerebral white matter, and to a lesser degree within the pons. 3. Several scattered supratentorial chronic microhemorrhages, nonspecific but likely reflecting sequela of longstanding poorly controlled hypertension. 4. Small chronic lacunar infarct within the right cerebellar hemisphere. 5. Mild generalized parenchymal atrophy. 6. Minimal bilateral ethmoid sinus mucosal thickening. 7. Trace fluid within the bilateral mastoid air cells. 8. T1 hypointense marrow signal within the calvarium, skull base and visualized cervical spine. While these findings can reflect a marrow infiltrative process, the most common causes include chronic anemia, smoking and obesity. Clinical correlation is recommended. Electronically Signed   By: Kellie Simmering DO   On: 10/13/2020 19:59    Procedures Procedures   Medications Ordered in ED Medications  aspirin EC tablet 81 mg (81 mg Oral Given 10/14/20 0918)  amLODipine (NORVASC) tablet 10 mg (10 mg Oral Given 10/14/20 0917)  losartan (COZAAR) tablet 100 mg (100 mg Oral Given 10/14/20 0918)  metFORMIN (GLUCOPHAGE) tablet 1,000 mg (1,000 mg Oral Given 10/14/20 1638)  gabapentin (NEURONTIN) capsule 400 mg (400 mg Oral Given 10/14/20 1638)  enoxaparin (LOVENOX) injection 40 mg (40 mg Subcutaneous Given 10/14/20 0917)  acetaminophen (TYLENOL) tablet 650 mg (650 mg Oral Given 10/14/20 0103)    Or  acetaminophen (TYLENOL) suppository 650 mg ( Rectal See Alternative 10/14/20 0103)  senna-docusate (Senokot-S) tablet 2 tablet (2 tablets Oral Not Given 10/14/20 0921)  nicotine (NICODERM CQ - dosed in mg/24 hours)  patch 14 mg (14 mg Transdermal Patch Applied 10/14/20 0918)  calcium carbonate (TUMS - dosed in mg elemental calcium) chewable tablet 800 mg of elemental calcium (800 mg of elemental calcium Oral Given 10/14/20 1637)  calcium gluconate 1 g/ 50 mL sodium chloride IVPB (0 g Intravenous Stopped 10/13/20 1906)  potassium chloride 10 mEq in 100 mL IVPB (0 mEq Intravenous Stopped 10/13/20 1906)  magnesium sulfate IVPB 2 g 50 mL (0 g Intravenous Stopped 10/13/20 1906)  gabapentin (NEURONTIN) capsule 400 mg (400 mg Oral Given 10/13/20 1758)  potassium chloride SA (KLOR-CON) CR tablet 40 mEq (40 mEq Oral Given 10/13/20 1959)  magnesium oxide (MAG-OX) tablet 400 mg (400 mg Oral Given 10/13/20 1959)  calcium carbonate (TUMS - dosed in mg elemental calcium) chewable tablet 400 mg of elemental calcium (400 mg of elemental calcium Oral Given 10/13/20 1959)  potassium chloride 10 mEq in 100 mL IVPB (0 mEq Intravenous Stopped 10/14/20 1526)  calcium gluconate 1 g/ 50 mL sodium chloride IVPB (0 mg Intravenous Stopped 10/14/20 0610)  magnesium sulfate IVPB 2 g 50 mL (2 g Intravenous New Bag/Given 10/14/20 0646)    ED Course  I have reviewed the  triage vital signs and the nursing notes.  Pertinent labs & imaging results that were available during my care of the patient were reviewed by me and considered in my medical decision making (see chart for details).    MDM Rules/Calculators/A&P                           Patient presents with multiple complaints as outlined above.  Regarding her chest pain, given the chronicity, intermittent nature, will obtain screening troponin and review EKG.  Her abdominal pain is resolved and she has not had emesis for 2 days and continues to have bowel movement; will trial p.o.  LFTs were checked and are normal.  No right upper quadrant tenderness to suggest cholecystitis.  Afebrile.  Her numbness seems less likely to be a stroke given that her labs show significant electrolyte abnormality and  it is isolated to V1 through V3 and her gross touch sensation is intact and she also has peripheral symptoms which would be more consistent with electrolyte derangement.  Her labs are notable for low calcium, low magnesium, low potassium.  Her EKG shows a QTC of 500 and it does look to be near half or slightly over half of the RR interval.  CTH wnl, MRI obtained; no focal abnormality. Doubt stroke at this point. PO and IV repletion of electrolytes ordered. Pt admited to hospitalist for further repletion, workup, and management. Stable on repeat evaluation.   Final Clinical Impression(s) / ED Diagnoses Final diagnoses:  None    Rx / DC Orders ED Discharge Orders    None       Aris Lot, MD 10/14/20 1834    Tegeler, Gwenyth Allegra, MD 10/15/20 1128

## 2020-10-13 NOTE — ED Triage Notes (Signed)
Pt here with L sided facial numbness yesterday at approx 1700. Pt states she has R sided L numbness and bil hand numbness for several days. No weakness, facial droop.

## 2020-10-13 NOTE — ED Notes (Signed)
Pt reporting wanting to leave, ED provider notified.

## 2020-10-13 NOTE — ED Provider Notes (Signed)
Emergency Medicine Provider Triage Evaluation Note  [name] , a 70 y.o. female  was evaluated in triage.  Pt complains of left sided facial numbness. Seen yesterday at [facility] for CP. Left facial numbness started at 5 pm yesterday. No history of stroke but family history. Sees spots out of both eyes, feels like its difficult to speak, no noticeable aphasia. Also complains of numbness in the hands and feet with tingling   Review of Systems  Positive: As above  Negative: As above   Physical Exam  BP 138/86 (BP Location: Left Arm)   Pulse (!) 102   Temp 98.6 F (37 C) (Oral)   Resp 15   SpO2 99%  Gen:   Awake, no distress   Resp:  Normal effort  MSK:   Moves extremities without difficulty  Other:  Tremor to face, hands and legs. No visible facial droop, no word slurring, equal strength in upper and lower extremities bilaterally    Medical Decision Making  Medically screening exam initiated at 12:35 PM.  Appropriate orders placed.  [name] was informed that the remainder of the evaluation will be completed by another provider, this initial triage assessment does not replace that evaluation, and the importance of remaining in the ED until their evaluation is complete.  Pt outside of TPA window, no focal neuro findings. Plan for labs, CT head, further evaluation by provider    Garald Balding, PA-C 10/13/20 [place], [place], DO 10/13/20 1402

## 2020-10-13 NOTE — ED Notes (Signed)
Pt provided turkey sandwhich.

## 2020-10-13 NOTE — Telephone Encounter (Signed)
Patient called in requesting appt for face numbness and was transferred to Triage RN. Patient states she was seen in [facility] ED yesterday for CP but didn't stay. States she fell out of w/c at the hospital and began to experience left sided face numbness that has persisted today. Also c/o bilateral hand numbness and right foot numbness. Has left AKA.. patient instructed to head directly to ED and stay for full evaluation. States she will come to [facility] ED.

## 2020-10-14 ENCOUNTER — Encounter (HOSPITAL_COMMUNITY): Payer: Self-pay | Admitting: Internal Medicine

## 2020-10-14 DIAGNOSIS — R202 Paresthesia of skin: Secondary | ICD-10-CM

## 2020-10-14 LAB — COMPREHENSIVE METABOLIC PANEL
ALT: 14 U/L (ref 0–44)
AST: 22 U/L (ref 15–41)
Albumin: 2.8 g/dL — ABNORMAL LOW (ref 3.5–5.0)
Alkaline Phosphatase: 47 U/L (ref 38–126)
Anion gap: 9 (ref 5–15)
BUN: 10 mg/dL (ref 8–23)
CO2: 23 mmol/L (ref 22–32)
Calcium: 5.9 mg/dL — CL (ref 8.9–10.3)
Chloride: 105 mmol/L (ref 98–111)
Creatinine, Ser: 1.05 mg/dL — ABNORMAL HIGH (ref 0.44–1.00)
GFR, Estimated: 58 mL/min — ABNORMAL LOW (ref >60)
Glucose, Bld: 188 mg/dL — ABNORMAL HIGH (ref 70–99)
Potassium: 3 mmol/L — ABNORMAL LOW (ref 3.5–5.1)
Sodium: 137 mmol/L (ref 135–145)
Total Bilirubin: 0.2 mg/dL — ABNORMAL LOW (ref 0.3–1.2)
Total Protein: 5.5 g/dL — ABNORMAL LOW (ref 6.5–8.1)

## 2020-10-14 LAB — HEMOGLOBIN A1C
Hgb A1c MFr Bld: 5.6 % (ref 4.8–5.6)
Mean Plasma Glucose: 114.02 mg/dL

## 2020-10-14 LAB — BASIC METABOLIC PANEL
Anion gap: 5 (ref 5–15)
BUN: 11 mg/dL (ref 8–23)
CO2: 25 mmol/L (ref 22–32)
Calcium: 7 mg/dL — ABNORMAL LOW (ref 8.9–10.3)
Chloride: 106 mmol/L (ref 98–111)
Creatinine, Ser: 1.15 mg/dL — ABNORMAL HIGH (ref 0.44–1.00)
GFR, Estimated: 52 mL/min — ABNORMAL LOW (ref >60)
Glucose, Bld: 105 mg/dL — ABNORMAL HIGH (ref 70–99)
Potassium: 3.5 mmol/L (ref 3.5–5.1)
Sodium: 136 mmol/L (ref 135–145)

## 2020-10-14 LAB — CBC
HCT: 36.1 % (ref 36.0–46.0)
Hemoglobin: 12.5 g/dL (ref 12.0–15.0)
MCH: 32.5 pg (ref 26.0–34.0)
MCHC: 34.6 g/dL (ref 30.0–36.0)
MCV: 93.8 fL (ref 80.0–100.0)
Platelets: 221 10*3/uL (ref 150–400)
RBC: 3.85 MIL/uL — ABNORMAL LOW (ref 3.87–5.11)
RDW: 13.4 % (ref 11.5–15.5)
WBC: 8.3 10*3/uL (ref 4.0–10.5)
nRBC: 0 % (ref 0.0–0.2)

## 2020-10-14 LAB — ETHYLENE GLYCOL: Ethylene Glycol Lvl: <5 mg/dL

## 2020-10-14 LAB — ETHANOL: Alcohol, Ethyl (B): <10 mg/dL (ref <10)

## 2020-10-14 LAB — PHOSPHORUS: Phosphorus: 2.8 mg/dL (ref 2.5–4.6)

## 2020-10-14 LAB — LIPASE, BLOOD: Lipase: 111 U/L — ABNORMAL HIGH (ref 11–51)

## 2020-10-14 LAB — VITAMIN D 25 HYDROXY (VIT D DEFICIENCY, FRACTURES): Vit D, 25-Hydroxy: 22.76 ng/mL — ABNORMAL LOW (ref 30–100)

## 2020-10-14 LAB — MAGNESIUM
Magnesium: 1 mg/dL — ABNORMAL LOW (ref 1.7–2.4)
Magnesium: 1.5 mg/dL — ABNORMAL LOW (ref 1.7–2.4)

## 2020-10-14 MED ORDER — CALCIUM CARBONATE ANTACID 500 MG PO CHEW
800.0000 mg | CHEWABLE_TABLET | Freq: Three times a day (TID) | ORAL | Status: DC
  Administered 2020-10-14 – 2020-10-15 (×4): 800 mg via ORAL
  Filled 2020-10-14 (×6): qty 4

## 2020-10-14 MED ORDER — MAGNESIUM SULFATE 2 GM/50ML IV SOLN
2.0000 g | Freq: Once | INTRAVENOUS | Status: AC
  Administered 2020-10-14: 2 g via INTRAVENOUS
  Filled 2020-10-14: qty 50

## 2020-10-14 MED ORDER — POTASSIUM CHLORIDE 20 MEQ PO PACK
40.0000 meq | PACK | Freq: Two times a day (BID) | ORAL | Status: AC
  Administered 2020-10-14 – 2020-10-15 (×2): 40 meq via ORAL
  Filled 2020-10-14 (×2): qty 2

## 2020-10-14 MED ORDER — CALCIUM GLUCONATE-NACL 1-0.675 GM/50ML-% IV SOLN
1.0000 g | Freq: Once | INTRAVENOUS | Status: AC
  Administered 2020-10-14: 1000 mg via INTRAVENOUS
  Filled 2020-10-14: qty 50

## 2020-10-14 MED ORDER — POTASSIUM CHLORIDE 10 MEQ/100ML IV SOLN
10.0000 meq | INTRAVENOUS | Status: AC
  Administered 2020-10-14 (×6): 10 meq via INTRAVENOUS
  Filled 2020-10-14 (×6): qty 100

## 2020-10-14 NOTE — Progress Notes (Incomplete)
Subjective:  ***  Objective:  Vital signs in last 24 hours: Vitals:   10/13/20 2130 10/14/20 0025 10/14/20 0047 10/14/20 0621  BP: (!) 146/88 (!) 139/104 127/75 (!) 148/86  Pulse: 77 80 81 74  Resp: 18 16 20 20   Temp:  98.6 F (37 C) 98.6 F (37 C) 98 F (36.7 C)  TempSrc:  Oral Oral Oral  SpO2: 94% 99% 99% 99%  Weight:  66.7 kg    Height:  5\' 2"  (1.575 m)     Weight change:   Intake/Output Summary (Last 24 hours) at 10/14/2020 6195 Last data filed at 10/14/2020 0932 Gross per 24 hour  Intake 444 ml  Output 500 ml  Net -56 ml   ***   Assessment/Plan:  Principal Problem:   Tingling in extremities Active Problems:   Hypokalemia   Hypocalcemia   Hypomagnesemia  [name] is a 70 y.o. woman with past medical history of iron deficiency anemia requiring multiple iron and blood transfusions secondary to GI AVMs, type 2 diabetes mellitus, gout, porphyria cutanea tarda resolved after treatment for hepatitis C, hypertension, tobacco use, substance use disorder, s/p L BKA who presents with face and arm numbness and admitted for further evaluation and treatment of severe symptomatic electrolyte derangements.   Severe symptomatic electrolyte derangements: hypocalcemia, hypomagnesemia, hypokalemia Ca 5.7, Mg 0.6, K 3.2 in the ED. EKG with a minimally prolonged QTc (500), facial and arm numbness significantly improved after initiation of treatment in ED. Suspect the electrolyte derangements are multifactorial, including chronic alcohol use which was exacerbated by the initiation of bevacizumab, which can cause hypomagnesemia and hypokalemia, being used off-label by hematology for recurrent GI bleeding. She started receiving these transfusions in March 2022, last dose 08/24/20, and has been experiencing symptoms including cramping and visual changes after each infusion. Unlikely to be pancreatitis as she is asymptomatic. No recent blood transfusions to cause a citrate-mediated  hypocalcemia. Given her history of depression, may consider ethylene glycol poisioning however less likely. Aside from bevacizumab, she is not on any typical offending agents. Hypoparathyroidism, vitamin D deficiency, osteoblastic lesions, and rhabdomyolysis unlikely as well. Electrolyte repletion started in the ED with 1g calcium gluconate, 400mg  elemental calcium TUMs, 50 mEq potassium, 2g Mag sulf bolus and 400mg  mag ox. - will monitor closely with frequent RFPs until electrolytes are stable - replete electrolytes - tele monitoring - check PTH, vit D, lipase, ethylene glycol, renal function panel (includes K, Mg, phos) now  Alcohol use disorder. Pt's report of alcohol use varied throughout the history taking but seems to be somewhere around 2 shots of hard alcohol per 3-4 days.  Polysubstance use disorder. Reports smoking crack cocaine and marijuana. States she would like to speak with social work for substance abuse resources. - CIWA - TOC consult for substance abuse counseling  Type 2 diabetes mellitus Last A1c 4% on 07/07/20. Suspected to be falsely low in setting of chronic blood loss anemia. On Trulicity 6.71 mg once weekly (Sunday evenings) and metformin 1000 mg twice daily. Serum glucose 102 on admission. - continue home metformin, she will be due for Trulicity on 2/45  Hypertension On amlodipine 10 mg daily and losartan 100 mg daily. Slightly hypertensive on admission. - continue home amlodipine 10 mg daily and losartan 100 mg daily  History of depression and anxiety Patient initially frustrated during interview and unwilling to answer questions because she was hungry. Later she became tearful when counseled regarding the seriousness of her electrolyte derangements. Her affect changed  dramatically over the course of the interview, and she became very pleasant and talkative. States her mood is good and discusses how happy she is in her relationship of 6 years, has a good support  system, etc. Denies SI. On chart review, she was seen in [facility] in February for grief relating to her sister's death and was prescribed valium which she reports she did not tolerate. Previously prescribed fluoxetine which patient states she can't remember why she stopped taking.   - Discussed option of referral to [facility] counselor     LOS: 1 day   Jacqlyn Larsen, Medical Student 10/14/2020, 6:26 AM

## 2020-10-14 NOTE — Progress Notes (Signed)
Patient arrived to unit.  Alert and oriented.  VSS.  No complaints at this time.    During arrival MD notified of labs needing to be drawn.  Phlebotomy called with request to come draw labs as soon as possible.    0226-Another call placed to phlebotomy requesting lab draws.

## 2020-10-14 NOTE — Progress Notes (Signed)
CRITICAL VALUE STICKER  CRITICAL VALUE:calcium  RECEIVER (on-site recipient of call): Caryl Pina  DATE & TIME NOTIFIED: 10/14/20 0353   MESSENGER (representative from lab):  MD NOTIFIED: Shon Baton  TIME OF NOTIFICATION: 0354  RESPONSE: tbd

## 2020-10-14 NOTE — Progress Notes (Signed)
Subjective: Pt states she still has tingling and numbness on Left side of face. Explained that her symptoms are most likely due to Electrolyte imbalance. Pt states she drinks 2 shots of liquor at night not every day but she recently drank 2 bottles of corn alcohol which she got from her friend. She also endorces using cocaine, marijuana. Pt states she wants to stay here tonight and doesn't want to leave in the late evening.  Objective:  Vital signs in last 24 hours: Vitals:   10/14/20 0621 10/14/20 0752 10/14/20 1216 10/14/20 1802  BP: (!) 148/86 (!) 148/88 136/80 133/71  Pulse: 74 81 80 80  Resp: 20   17  Temp: 98 F (36.7 C)  97.8 F (36.6 C) 99.1 F (37.3 C)  TempSrc: Oral  Oral Oral  SpO2: 99% 98% 97% 97%  Weight:      Height:        CBC Latest Ref Rng & Units 10/14/2020 10/13/2020 10/13/2020  WBC 4.0 - 10.5 K/uL 8.3 8.2 8.5  Hemoglobin 12.0 - 15.0 g/dL 12.5 13.9 14.5  Hematocrit 36.0 - 46.0 % 36.1 40.7 42.0  Platelets 150 - 400 K/uL 221 242 264   CMP Latest Ref Rng & Units 10/14/2020 10/14/2020 10/13/2020  Glucose 70 - 99 mg/dL 105(H) 188(H) 99  BUN 8 - 23 mg/dL 11 10 11   Creatinine 0.44 - 1.00 mg/dL 1.15(H) 1.05(H) 1.10(H)  Sodium 135 - 145 mmol/L 136 137 143  Potassium 3.5 - 5.1 mmol/L 3.5 3.0(L) 3.1(L)  Chloride 98 - 111 mmol/L 106 105 108  CO2 22 - 32 mmol/L 25 23 24   Calcium 8.9 - 10.3 mg/dL 7.0(L) 5.9(LL) 5.7(LL)  Total Protein 6.5 - 8.1 g/dL - 5.5(L) 6.2(L)  Total Bilirubin 0.3 - 1.2 mg/dL - 0.2(L) 0.6  Alkaline Phos 38 - 126 U/L - 47 50  AST 15 - 41 U/L - 22 22  ALT 0 - 44 U/L - 14 16    Physical Exam Constitutional: Pt lying in the bed. Not in acute distress Head: Atraumatic and normocephalic Eyes: EOMI, PEERL Cardiovascular: regular rate and rhythm, normal heart sounds Pulmonary: effort normal, lungs clear to ascultation bilaterally Skin: warm and dry. Ext: warm, well perfused, intact peripheral pulses, Left leg amputated.   NEURO Exam:  Mental Status:  AA&Ox3 Oriented to self, age, place, situation , normal Attention and Concentration Language: speech fluency, and comprehension intact. Cranial Nerves: II- XII  Intact Motor: No Drift in Upper Extremities, No Drift in RLE. Strength 5/5 in Rt UE, 5/5 in Lt UE, Strength in Rt LE  5/5, Left leg amputated Tone: is normal and bulk is normal Sensation- No facial droop. Decreased sensation of Left face except forehead.  Gait- deferred Psychiatric: normal mood and affect  Assessment/Plan: [name] is a 70 y.o. female with hx of iron deficiency anemia requiring multiple iron and blood transfusions secondary to GI AVMs, type 2 diabetes mellitus, gout, porphyria cutanea tarda resolved after treatment for hepatitis C, hypertension, tobacco use, substance use disorder, s/p L BKA who presents with face and arm numbness and admitted for further evaluation and treatment of severe symptomatic electrolyte derangements.   Severe symptomatic electrolyte derangements: hypocalcemia, hypomagnesemia, hypokalemia  Suspect the electrolyte derangements are multifactorial, including recent use of corn alcohol also on bevacizumab,  Electrolyte repleated with calcium gluconate, elemental calcium TUMs, potassium, Mag sulf bolus, mag oxide oral and IV potassium.  MRI shows no acute abnormality, several scattered supratentorial chronic microhemorrhages,nonspecific but likely reflecting sequela  of longstanding poorly controlled hypertension, chronic small lacunar infarct within the right cerebellar hemisphere.  Vitamin D 22.76, Glucose 188, HbA1c 5.6, Ethylene glycol and Ethyl alcohol negative.  Iron studies shows high ferritin at 459, and normal iron.  CBC within normal limits, Phosphorous normal at 2.8. Electrolytes after repletion's at 1703- Na 136, potassium 3.5, calcium 7, magnesium 1.5.  Creatinine 1.15, BUN 11, GFR 52.  --F/U BMP tomorrow morning with magnesium - tele monitoring -Follow-up PTH -Continue Tums 1 tablet  3 times daily with meals. -Continue K-Lor oral 40 mEq 2 times daily.   Alcohol use disorder.  Polysubstance use disorder. Reported history of alcohol use disorder.  Has been drinking  2 bottles of corn alcohol since last week which she got from her friend.  Reports using crack cocaine and marijuana occasionally. - CIWA - TOC consult for substance abuse counseling  Type 2 diabetes mellitus Most recent blood glucose 105 -Continue home metformin . -Due for Trulicity on 0/26   Hypertension BP stable since admission.  Most recent BP 133/70 mmHg. -Continue amlodipine 10 mg daily and losartan 100 mg daily   Diet: Regular IVF: None VTE: Lovenox Prior to Admission Living Arrangement: Home Anticipated Discharge Location: Home Barriers to Discharge: Medical work-up Dispo: Anticipated discharge in approximately 1-2 day(s).   Armando Reichert, MD 10/14/2020, 7:53 PM Pager: [phone] After 5pm on weekdays and 1pm on weekends: On Call pager [phone]

## 2020-10-15 LAB — BASIC METABOLIC PANEL
Anion gap: 7 (ref 5–15)
Anion gap: 9 (ref 5–15)
BUN: 10 mg/dL (ref 8–23)
BUN: 9 mg/dL (ref 8–23)
CO2: 23 mmol/L (ref 22–32)
CO2: 22 mmol/L (ref 22–32)
Calcium: 7.9 mg/dL — ABNORMAL LOW (ref 8.9–10.3)
Calcium: 8.3 mg/dL — ABNORMAL LOW (ref 8.9–10.3)
Chloride: 106 mmol/L (ref 98–111)
Chloride: 105 mmol/L (ref 98–111)
Creatinine, Ser: 0.93 mg/dL (ref 0.44–1.00)
Creatinine, Ser: 0.95 mg/dL (ref 0.44–1.00)
GFR, Estimated: >60 mL/min (ref >60)
GFR, Estimated: >60 mL/min (ref >60)
Glucose, Bld: 110 mg/dL — ABNORMAL HIGH (ref 70–99)
Glucose, Bld: 112 mg/dL — ABNORMAL HIGH (ref 70–99)
Potassium: 4.1 mmol/L (ref 3.5–5.1)
Potassium: 4.3 mmol/L (ref 3.5–5.1)
Sodium: 136 mmol/L (ref 135–145)
Sodium: 136 mmol/L (ref 135–145)

## 2020-10-15 LAB — MAGNESIUM
Magnesium: 1.8 mg/dL (ref 1.7–2.4)
Magnesium: 1.8 mg/dL (ref 1.7–2.4)

## 2020-10-15 MED ORDER — NICOTINE 14 MG/24HR TD PT24: 14.0000 mg | MEDICATED_PATCH | Freq: Every day | TRANSDERMAL | 0 refills | Status: DC

## 2020-10-15 MED ORDER — GABAPENTIN 400 MG PO CAPS: 400.0000 mg | ORAL_CAPSULE | Freq: Three times a day (TID) | ORAL | 0 refills | Status: DC

## 2020-10-15 MED ORDER — CALCIUM CARBONATE ANTACID 500 MG PO CHEW: 800.0000 mg | CHEWABLE_TABLET | Freq: Three times a day (TID) | ORAL | 0 refills | Status: AC

## 2020-10-15 MED ORDER — MAGNESIUM SULFATE 2 GM/50ML IV SOLN
2.0000 g | Freq: Once | INTRAVENOUS | Status: AC
  Administered 2020-10-15: 2 g via INTRAVENOUS
  Filled 2020-10-15: qty 50

## 2020-10-15 NOTE — Discharge Summary (Signed)
Name: [name] MRN: [id number] DOB: February 05, 1951 70 y.o. PCP: Jose Persia, MD  Date of Admission: 10/13/2020 12:29 PM Date of Discharge: 10/15/20 Attending Physician: Angelica Pou, MD   Discharge Diagnosis: 1. Hypocalcemia  2. Hypokalemia 3. Hypomagnesemia 4. Alcohol use disorder 5. Polysubstance use disorder 6. Type 2 diabetes mellitus 7. Hypertension  Discharge Medications: Allergies as of 10/15/2020      Reactions   Ciprofloxacin Hives, Swelling   Morphine And Related Other (See Comments)   Makes pt sad and paranoid   Penicillins Hives, Swelling   Has patient had a PCN reaction causing immediate rash, facial/tongue/throat swelling, SOB or lightheadedness with hypotension:YES Has patient had a PCN reaction causing severe rash involving mucus membranes or skin necrosis:UNSURE Has patient had a PCN reaction that required hospitalization:YES Has patient had a PCN reaction occurring within the last 10 years:No If all of the above answers are "NO", then may proceed with Cephalosporin use.   Codeine Hives, Swelling   Lisinopril Cough   New onset dry cough after starting lisinopril. Resolved upon switching to losartan.   Feraheme [ferumoxytol] Itching   Tolerated Feraheme 6/26 & 7/21 pre-medications prior to medication      Medication List    STOP taking these medications   acetaminophen 500 MG tablet Commonly known as: TYLENOL   allopurinol 100 MG tablet Commonly known as: ZYLOPRIM   diazepam 5 MG tablet Commonly known as: VALIUM     TAKE these medications   Accu-Chek FastClix Lancets Misc Use to test blood glucose 3 times daily.   Accu-Chek Guide test strip Generic drug: glucose blood USE TO TEST BLOOD SUGAR THREE TIMES A DAY   Accu-Chek Guide w/Device Kit 1 each by Does not apply route 3 (three) times daily. The patient is insulin requiring, ICD 10 code E11.65. The patient tests 3 times a day.   amLODipine 10 MG tablet Commonly known as:  NORVASC Take 1 tablet (10 mg total) by mouth daily.   aspirin EC 81 MG tablet Take 1 tablet (81 mg total) by mouth daily.   calcium carbonate 500 MG chewable tablet Commonly known as: TUMS - dosed in mg elemental calcium Chew 4 tablets (800 mg of elemental calcium total) by mouth 3 (three) times daily with meals for 5 days.   cetirizine 10 MG tablet Commonly known as: ZYRTEC Take 1 tablet (10 mg total) by mouth daily.   colchicine 0.6 MG tablet Take 1 tablet (0.6 mg total) by mouth as needed (gout).   feeding supplement (GLUCERNA SHAKE) Liqd Take 237 mLs by mouth 3 (three) times daily between meals. What changed: when to take this   gabapentin 400 MG capsule Commonly known as: NEURONTIN Take 1 capsule (400 mg total) by mouth 3 (three) times daily. What changed: See the new instructions.   losartan 100 MG tablet Commonly known as: COZAAR TAKE 1 TABLET (100 MG TOTAL) BY MOUTH DAILY. What changed: how much to take   metFORMIN 1000 MG tablet Commonly known as: GLUCOPHAGE TAKE 1 TABLET BY MOUTH TWICE A DAY WITH A MEAL What changed: See the new instructions.   nicotine 14 mg/24hr patch Commonly known as: NICODERM CQ - dosed in mg/24 hours Place 1 patch (14 mg total) onto the skin daily. Start taking on: Oct 16, 2020   omeprazole 20 MG capsule Commonly known as: PRILOSEC Take 1 capsule (20 mg total) by mouth daily.   Trulicity 9.68 YO/3.7CH Sopn Generic drug: Dulaglutide INJECT 0.75 MG INTO THE SKIN  ONCE A WEEK. What changed: additional instructions       Disposition and follow-up:   Ms.Serin D Butrick was discharged from [facility] in Stable condition.  At the hospital follow up visit please address:  1.  Follow up: -Follow up with your PCP at [facility] Clinic in 1 week.   2.  Labs / imaging needed at time of follow-up: CMP, CBC  3.  Pending labs/ test needing follow-up: None  Follow-up Appointments:  Follow-up Information    Jose Persia, MD.  Schedule an appointment as soon as possible for a visit in 1 week(s).   Specialty: Internal Medicine Contact information: [address] [place] [address] [place] [facility] Course by problem list: Severesymptomaticelectrolyte derangements: hypocalcemia, hypomagnesemia, hypokalemia Patient presented with left face and arm numbness and found to have severe symptomatic electrolyte derangements. Suspect the electrolyte derangements are multifactorial, including recent initiation of bevacizum therapy, and abuse of corn alcohol. Electrolyte repleated with several rounds of calcium gluconate, elemental calcium, TUMs, Mag sulf bolus, mag oxide oral and IV and oral potassium.  MRI was done to rule out stroke which showd no acute abnormality, several scattered supratentorial chronic microhemorrhages,nonspecific but likely reflecting sequela of longstanding poorly controlled hypertension, chronic small lacunar infarct within the right cerebellar hemisphere. Vitamin D 22.76, Glucose 188, HbA1c 5.6, Ethylene glycol and Ethyl alcohol negative.  Iron studies shows high ferritin at 459, and normal iron.  CBC within normal limits, Phosphorous normal at 2.8. Electrolytes at discharge are-sodium 136, potassium 4.3, calcium 7.9, magnesium 1.8.  2 g mag sulfate given before discharge and patient was discharged on Tums. You were advised to STOP BEVACIZUMAB treatments.   Alcohol use disorder.  Polysubstance use disorder. Tobacco use disorder Reported history of alcohol use disorder. Reported drinking 2 bottles of corn alcohol within last week which she got from her friend. Reports using crack cocaine and marijuana occasionally. TOC consult for substance abuse counseling was done.  Patient interested in peers support. Patient was put on nicotine patch.  Prescription given at discharge.  Type 2diabetes mellitus Patient was continued on home metformin 1000 mg twice daily.   Patient can continueTrulicity as per schedule next on 5/22 .   Hypertension BP mildly elevated but overall stable since admission. amlodipine 10 mg and losartan 100 mg dailycontinued.  Subjective on day of discharge: Patient states she is feeling better and her numbness is also getting better.  Denies any new  or symptoms Patient is excited to go home.  Discharge Exam:   BP (!) 161/86 (BP Location: Right Arm)   Pulse 90   Temp 98 F (36.7 C) (Oral)   Resp 16   Ht 5' 2" (1.575 m)   Wt 68.9 kg   SpO2 100%   BMI 27.78 kg/m  Discharge exam: Physical Exam Vitals reviewed.  Constitutional:      General: She is not in acute distress.    Appearance: Normal appearance. She is not ill-appearing, toxic-appearing or diaphoretic.  HENT:     Head: Normocephalic and atraumatic.     Mouth/Throat:     Mouth: Mucous membranes are moist.  Cardiovascular:     Rate and Rhythm: Normal rate and regular rhythm.     Pulses: Normal pulses.  Pulmonary:     Effort: Pulmonary effort is normal. No respiratory distress.     Breath sounds: Normal breath sounds. No stridor. No wheezing  or rhonchi.  Musculoskeletal:        General: Normal range of motion.     Comments: Left leg amputated.  Skin:    General: Skin is warm and dry.  Neurological:     General: No focal deficit present.     Mental Status: She is alert and oriented to person, place, and time.    Pertinent Labs, Studies, and Procedures:  CT Head Wo Contrast  Result Date: 10/13/2020 CLINICAL DATA:  Facial numbness EXAM: CT HEAD WITHOUT CONTRAST TECHNIQUE: Contiguous axial images were obtained from the base of the skull through the vertex without intravenous contrast. COMPARISON:  None. FINDINGS: Brain: No evidence of acute infarction, hemorrhage, hydrocephalus, extra-axial collection or mass lesion/mass effect. Mild periventricular white matter hypodensity. Vascular: No hyperdense vessel or unexpected calcification. Skull: Normal. Negative  for fracture or focal lesion. Sinuses/Orbits: No acute finding. Other: None. IMPRESSION: No acute intracranial pathology. Mild small-vessel white matter disease. Electronically Signed   By: Eddie Candle M.D.   On: 10/13/2020 13:55   MR BRAIN WO CONTRAST  Result Date: 10/13/2020 CLINICAL DATA:  Neuro deficit, acute, stroke suspected; left facial and arm numbness. EXAM: MRI HEAD WITHOUT CONTRAST TECHNIQUE: Multiplanar, multiecho pulse sequences of the brain and surrounding structures were obtained without intravenous contrast. COMPARISON:  Prior head CT examinations 10/13/2020 and earlier. FINDINGS: Brain: Mild cerebral and cerebellar atrophy. Mild to moderate multifocal T2/FLAIR hyperintensity within the cerebral white matter, nonspecific but compatible with chronic small vessel ischemic disease. Minimal chronic small-vessel ischemic changes are also present within the pons. Chronic lacunar infarct within the right cerebellar hemisphere. There are several scattered supratentorial chronic microhemorrhages, nonspecific but likely reflecting sequela of hypertensive microangiopathy. There is no acute infarct. No evidence of intracranial mass. No extra-axial fluid collection. No midline shift. Vascular: Expected proximal arterial flow voids. Skull and upper cervical spine: No focal marrow lesion. T1 hypointense marrow signal within the calvarium, skull base and visualized cervical spine. Sinuses/Orbits: Visualized orbits show no acute finding. Trace bilateral ethmoid sinus mucosal thickening. Other: Trace fluid within the bilateral mastoid air cells. IMPRESSION: 1. No evidence of acute intracranial abnormality. 2. Mild-to-moderate chronic small vessel ischemic changes within the cerebral white matter, and to a lesser degree within the pons. 3. Several scattered supratentorial chronic microhemorrhages, nonspecific but likely reflecting sequela of longstanding poorly controlled hypertension. 4. Small chronic lacunar  infarct within the right cerebellar hemisphere. 5. Mild generalized parenchymal atrophy. 6. Minimal bilateral ethmoid sinus mucosal thickening. 7. Trace fluid within the bilateral mastoid air cells. 8. T1 hypointense marrow signal within the calvarium, skull base and visualized cervical spine. While these findings can reflect a marrow infiltrative process, the most common causes include chronic anemia, smoking and obesity. Clinical correlation is recommended. Electronically Signed   By: Kellie Simmering DO   On: 10/13/2020 19:59      Discharge Instructions: Discharge Instructions    Call MD for:  difficulty breathing, headache or visual disturbances   Complete by: As directed    Call MD for:  extreme fatigue   Complete by: As directed    Call MD for:  temperature >100.4   Complete by: As directed    Diet - low sodium heart healthy   Complete by: As directed    Increase activity slowly   Complete by: As directed      Dear [name],   Thank you for letting us participate in your care! In this section, you will find a brief hospital admission summary  of why you were admitted to the hospital, what happened during your admission, your diagnosis/diagnoses, and recommended follow up.   You were admitted because you were experiencing numbness of your face.   Your testing revealed electrolyte imbalance.  You were diagnosed with Hypokalemia, Hypomagnesemia and hypocalcemia.  You were treated with Potasium, Magnesium and Calcium supplement  Your symptoms improved and you were discharged from the hospital for meeting this goal.  You were recommended to STOP BEVACIZUMAB treatments.    POST-HOSPITAL & CARE INSTRUCTIONS 8. Follow up with your PCP at [facility] Clinic in 1 week.  9. DO NOT GET BEVACIZUMAB treatments.  10. Please let PCP/Specialists know of any changes in medications that were made.  11. Please see medications section of this packet for any medication changes.  DOCTOR'S  APPOINTMENTS & FOLLOW UP Future Appointments  Date Time Provider Dargan  11/02/2020  8:00 AM [facility] None  11/23/2020  8:45 AM [facility] None  11/23/2020  9:20 AM Truitt Merle, MD [facility] None  11/23/2020 10:15 AM [facility] INFUSION [facility] None     Thank you for choosing [facility]! Take care and be well!  Internal Medicine Teaching Service Inpatient Team [facility]  [place], [place] [address] [phone]  Signed: Armando Reichert, MD 10/15/2020, 2:06 PM   Pager: [phone]

## 2020-10-15 NOTE — Plan of Care (Signed)

## 2020-10-15 NOTE — Discharge Instructions (Signed)
Dear [name],   Thank you for letting us participate in your care! In this section, you will find a brief hospital admission summary of why you were admitted to the hospital, what happened during your admission, your diagnosis/diagnoses, and recommended follow up.   You were admitted because you were experiencing numbness of your face.   Your testing revealed electrolyte imbalance.  You were diagnosed with Hypokalemia, Hypomagnesemia and hypocalcemia.  You were treated with Potasium, Magnesium and Calcium supplement  Your symptoms improved and you were discharged from the hospital for meeting this goal.  You were recommended to STOP BEVACIZUMAB treatments.    POST-HOSPITAL & CARE INSTRUCTIONS 1. Follow up with your PCP at [facility] in 1 week.  2. DO NOT GET BEVACIZUMAB treatments.  3. Please let PCP/Specialists know of any changes in medications that were made.  4. Please see medications section of this packet for any medication changes.  DOCTOR'S APPOINTMENTS & FOLLOW UP Future Appointments  Date Time Provider Jaconita  11/02/2020  8:00 AM [facility] None  11/23/2020  8:45 AM [facility] None  11/23/2020  9:20 AM Truitt Merle, MD [facility] None  11/23/2020 10:15 AM [facility] INFUSION [facility] None     Thank you for choosing [facility]! Take care and be well!  Internal [facility]  [address] [place], [place] [address] [phone]

## 2020-10-16 LAB — PTH, INTACT AND CALCIUM
Calcium, Total (PTH): 5.9 mg/dL — CL (ref 8.7–10.3)
PTH: 48 pg/mL (ref 15–65)

## 2020-10-19 ENCOUNTER — Other Ambulatory Visit (HOSPITAL_COMMUNITY): Payer: Self-pay

## 2020-10-19 ENCOUNTER — Encounter: Payer: Self-pay | Admitting: Internal Medicine

## 2020-10-19 ENCOUNTER — Encounter: Payer: Self-pay | Admitting: Hematology

## 2020-10-19 ENCOUNTER — Other Ambulatory Visit: Payer: Self-pay

## 2020-10-19 ENCOUNTER — Ambulatory Visit (INDEPENDENT_AMBULATORY_CARE_PROVIDER_SITE_OTHER): Payer: Medicare Other | Admitting: Internal Medicine

## 2020-10-19 DIAGNOSIS — I10 Essential (primary) hypertension: Secondary | ICD-10-CM | POA: Diagnosis not present

## 2020-10-19 DIAGNOSIS — K219 Gastro-esophageal reflux disease without esophagitis: Secondary | ICD-10-CM

## 2020-10-19 DIAGNOSIS — F411 Generalized anxiety disorder: Secondary | ICD-10-CM

## 2020-10-19 DIAGNOSIS — F329 Major depressive disorder, single episode, unspecified: Secondary | ICD-10-CM

## 2020-10-19 DIAGNOSIS — E1140 Type 2 diabetes mellitus with diabetic neuropathy, unspecified: Secondary | ICD-10-CM

## 2020-10-19 DIAGNOSIS — F1990 Other psychoactive substance use, unspecified, uncomplicated: Secondary | ICD-10-CM

## 2020-10-19 MED ORDER — NICOTINE 14 MG/24HR TD PT24
14.0000 mg | MEDICATED_PATCH | Freq: Every day | TRANSDERMAL | 0 refills | Status: DC
  Filled 2020-10-19 (×2): qty 28, 28d supply, fill #0

## 2020-10-19 MED ORDER — OMEPRAZOLE 20 MG PO CPDR: 20.0000 mg | DELAYED_RELEASE_CAPSULE | Freq: Every day | ORAL | 1 refills | Status: DC

## 2020-10-19 MED ORDER — GABAPENTIN 400 MG PO CAPS: ORAL_CAPSULE | ORAL | 0 refills | Status: DC

## 2020-10-19 NOTE — Assessment & Plan Note (Signed)
[name] states that since discharge, she has not had anything alcoholic to drink and has not resumed smoking cigarettes.  She has a support of her partner, who is here today, to help her with her alcohol cessation.  Denies any cravings to drink alcohol.  Assessment/plan: I commended [name] on her great work on alcohol cessation.  I feel that Dr. Theodis Shove will not only be able to help her with her depression anxiety, but also substance use disorder.  -Referral to [facility]

## 2020-10-19 NOTE — Assessment & Plan Note (Signed)
>>  ASSESSMENT AND PLAN FOR TYPE 2 DIABETES MELLITUS WITH DIABETIC NEUROPATHY (HCC) WRITTEN ON 10/19/2020  5:49 PM BY ARNETT SAUNDERS, MD  [name] states that she is doing well on her current Trulicity  and metformin .  She will begin to crush metformin  to put in her food though after first cleaning it with her pharmacist given how large the pills are.  She states her gabapentin  was decreased at her hospital visit and is requesting that the dose go back to where it was due to continue neuropathy.  Assessment/plan: A1c within normal limits although likely under-estimated due to chronic blood loss anemia.   - Continue Trulicity  and metformin  - Patient was previously on gabapentin  400 mg 1 tablet in the morning, 1 tablet in the afternoon, 2 tablets at bedtime.  We will resume this frequency and dose.

## 2020-10-19 NOTE — Assessment & Plan Note (Signed)
-   Referral to [facility] placed

## 2020-10-19 NOTE — Assessment & Plan Note (Signed)
[name] states that she continues to struggle with depression at times.  Her boyfriend at the appointment agrees with this.  She notes frequent irritability, mood swings with occasional bursts of crying.  She feels down and depressed most days of the week.  She denies any SI.  At this time she would prefer therapy over medication.  Assessment/plan: PHQ mildly elevated at 10.  We will go ahead and refer to Dr. Theodis [name] for counseling.  She was previously on fluoxetine but would like to hold off on restarting today.   -Referral to [facility]

## 2020-10-19 NOTE — Patient Instructions (Signed)
It was nice seeing you today! Thank you for choosing [facility] for your Primary Care.    Today we talked about:   1. We recheck your electrolytes. I will call you with the results.   2. Take your blood pressure once per day with the instructions provided  3. Increase your bedtime Gabapentin to 2 tablets. Continue taking 1 tablet in the morning and 1 tablet in the afternoon

## 2020-10-19 NOTE — Assessment & Plan Note (Addendum)
BP Readings from Last 3 Encounters:  10/19/20 134/76  10/15/20 (!) 161/86  10/12/20 132/83   [name] states she is taking Amlodipine and Losartan daily without difficulty. She has started to take her home BP for the past few days, approximately 1-3 times per day. Systolic ranges from 937-902 and diastolic from 40-973Z. She denies any CP, SOB, dizziness.   Assessment/Plan:  Although BP well controlled at this time, it appears elevated at times at home. We discussed proper method to take home BP. Given she has recently quit alcohol use, I expect her BP may continue to improve. Will hold off on mediation adjustments for now.   - Continue home BP daily  - Continue Losartan and Amlodipine - Follow up in 1 month - If remains elevated, consider addition of beta-blocker. Previously severe electrolyte abnormalities on thiazides.

## 2020-10-19 NOTE — Assessment & Plan Note (Addendum)
Recent hospitalization for hypocalcemia as low as 5.7 with hypokalemia and hypomagnesemia felt to be secondary to chronic alcohol use with recent ingestion of corn whiskey.  She was treated with IV calcium, Tums; discharged on daily Tums.  She notes that she continues to have occasional facial twitching when she smiles, but it is improving greatly.  She continues to have some tingling in her right foot, however this is chronic.  Assessment/plan: Patient has not drank any alcohol since discharge.  We will go ahead and recheck her electrolytes today to determine the continued need for calcium carbonate supplementation.  -CMP pending, magnesium added on

## 2020-10-19 NOTE — Progress Notes (Signed)
   CC: Hypocalcemia, hospital follow-up  HPI:  [name] is a 70 y.o. with a PMHx as listed below who presents to the clinic for hypocalcemia, hospital follow-up.   Please see the Encounters tab for problem-based Assessment & Plan regarding status of patient's acute and chronic conditions.  Past Medical History:  Diagnosis Date  . Allergic rhinitis   . Anemia, iron deficiency 05/03/2011   2/2 GI AVMs - recieves iron infusions as well as periodic red blood cell transfusions  . Anxiety   . Arteriovenous malformation of duodenum 02/25/2015  . CARPAL TUNNEL RELEASE, HX OF 07/22/2008  . Chest pain 10/28/2019  . Colon polyps   . Diabetes mellitus without complication ([place])    type 2  . Difficulty sleeping    takes trazadone for sleep  . Diverticulosis   . Fall 01/03/2020  . GERD (gastroesophageal reflux disease)   . Hepatitis C    in SVR with [facility] 12/2014-02/2015.   Marland Kitchen History of hernia repair 08/18/2012  . History of porphyria 11/30/2014   Porphyria cutanea tarda in setting of chronic Hepatitis C, frequent  IV iron infusions, and alcohol abuse   . Hypertension   . Hypocalcemia 10/28/2019  . Hypokalemia 09/27/2014  . Peripheral vascular disease ([place])   . Stroke [facility]) 2010   no residual deficits  . Substance use disorder    cocaine  . Tachycardia 07/10/2017  . Trichomonas vaginitis 04/16/2019   Review of Systems: Review of Systems  Constitutional: Negative for chills, fever and weight loss.  Respiratory: Negative for cough and shortness of breath.   Neurological: Positive for tingling. Negative for focal weakness and weakness.  Psychiatric/Behavioral: Positive for depression. The patient is nervous/anxious.    Physical Exam:  Vitals:   10/19/20 1531  BP: 134/76  Pulse: 90  Temp: 98.3 F (36.8 C)  TempSrc: Oral  SpO2: 99%  Weight: 160 lb 1.6 oz (72.6 kg)  Height: 5\' 2"  (1.575 m)   Physical Exam Vitals and nursing note reviewed.  Constitutional:      General: She  is not in acute distress.    Appearance: She is normal weight.  HENT:     Head: Normocephalic and atraumatic.  Cardiovascular:     Rate and Rhythm: Normal rate and regular rhythm.     Heart sounds: No murmur heard.   Pulmonary:     Effort: Pulmonary effort is normal. No respiratory distress.     Breath sounds: Normal breath sounds. No wheezing, rhonchi or rales.  Musculoskeletal:     Right lower leg: No edema.  Skin:    General: Skin is warm and dry.  Neurological:     General: No focal deficit present.     Mental Status: She is alert and oriented to person, place, and time. Mental status is at baseline.  Psychiatric:        Mood and Affect: Mood normal.        Behavior: Behavior normal.        Thought Content: Thought content normal.        Judgment: Judgment normal.    Assessment & Plan:   See Encounters Tab for problem based charting.  Patient discussed with Dr. Heber [place]

## 2020-10-19 NOTE — Assessment & Plan Note (Signed)
Ms. Esch states that she is doing well on her current Trulicity and metformin.  She will begin to crush metformin to put in her food though after first cleaning it with her pharmacist given how large the pills are.  She states her gabapentin was decreased at her hospital visit and is requesting that the dose go back to where it was due to continue neuropathy.  Assessment/plan: A1c within normal limits although likely under-estimated due to chronic blood loss anemia.   - Continue Trulicity and metformin - Patient was previously on gabapentin 400 mg 1 tablet in the morning, 1 tablet in the afternoon, 2 tablets at bedtime.  We will resume this frequency and dose.

## 2020-10-19 NOTE — Assessment & Plan Note (Signed)
-   Refilled Omeprazole. Will need to monitor electrolytes while continuing on this medication. Re-assess at next visit if continues to be required.

## 2020-10-19 NOTE — Assessment & Plan Note (Signed)
Please see hypocalcemia assessment and plan for further details.

## 2020-10-20 ENCOUNTER — Encounter: Payer: Self-pay | Admitting: Hematology

## 2020-10-20 LAB — CMP14 + ANION GAP
ALT: 15 IU/L (ref 0–32)
AST: 18 IU/L (ref 0–40)
Albumin/Globulin Ratio: 1.6 (ref 1.2–2.2)
Albumin: 4.2 g/dL (ref 3.8–4.8)
Alkaline Phosphatase: 73 IU/L (ref 44–121)
Anion Gap: 18 mmol/L (ref 10.0–18.0)
BUN/Creatinine Ratio: 19 (ref 12–28)
BUN: 19 mg/dL (ref 8–27)
Bilirubin Total: <0.2 mg/dL (ref 0.0–1.2)
CO2: 22 mmol/L (ref 20–29)
Calcium: 9.6 mg/dL (ref 8.7–10.3)
Chloride: 100 mmol/L (ref 96–106)
Creatinine, Ser: 1.01 mg/dL — ABNORMAL HIGH (ref 0.57–1.00)
Globulin, Total: 2.7 g/dL (ref 1.5–4.5)
Glucose: 87 mg/dL (ref 65–99)
Potassium: 4.8 mmol/L (ref 3.5–5.2)
Sodium: 140 mmol/L (ref 134–144)
Total Protein: 6.9 g/dL (ref 6.0–8.5)
eGFR: 61 mL/min/{1.73_m2} (ref >59)

## 2020-10-21 ENCOUNTER — Encounter: Payer: Self-pay | Admitting: Hematology

## 2020-10-21 NOTE — Progress Notes (Signed)
Internal Medicine Clinic Attending  Case discussed with Dr. Basaraba  At the time of the visit.  We reviewed the resident's history and exam and pertinent patient test results.  I agree with the assessment, diagnosis, and plan of care documented in the resident's note.  

## 2020-10-21 NOTE — Progress Notes (Signed)
Contacted [name] to discuss her results.  Calcium has normalized.  No additional need for supplemental calcium and instructed her to discontinue Tums.  She expressed understanding.  We will need to recheck her calcium levels in 1 month to ensure she does not need any additional supplementation.  I suspect she will not as long as she maintains her sobriety

## 2020-10-27 ENCOUNTER — Encounter: Payer: Self-pay | Admitting: Internal Medicine

## 2020-11-01 ENCOUNTER — Other Ambulatory Visit: Payer: Self-pay

## 2020-11-01 MED ORDER — CETIRIZINE HCL 10 MG PO TABS: 10.0000 mg | ORAL_TABLET | Freq: Every day | ORAL | 0 refills | Status: DC

## 2020-11-02 ENCOUNTER — Inpatient Hospital Stay: Payer: Medicare Other | Attending: Hematology

## 2020-11-02 ENCOUNTER — Other Ambulatory Visit: Payer: Self-pay

## 2020-11-02 ENCOUNTER — Telehealth: Payer: Self-pay

## 2020-11-02 DIAGNOSIS — Z79899 Other long term (current) drug therapy: Secondary | ICD-10-CM | POA: Diagnosis not present

## 2020-11-02 DIAGNOSIS — D631 Anemia in chronic kidney disease: Secondary | ICD-10-CM | POA: Insufficient documentation

## 2020-11-02 DIAGNOSIS — Z993 Dependence on wheelchair: Secondary | ICD-10-CM | POA: Insufficient documentation

## 2020-11-02 DIAGNOSIS — I129 Hypertensive chronic kidney disease with stage 1 through stage 4 chronic kidney disease, or unspecified chronic kidney disease: Secondary | ICD-10-CM | POA: Diagnosis not present

## 2020-11-02 DIAGNOSIS — I739 Peripheral vascular disease, unspecified: Secondary | ICD-10-CM | POA: Insufficient documentation

## 2020-11-02 DIAGNOSIS — Z8673 Personal history of transient ischemic attack (TIA), and cerebral infarction without residual deficits: Secondary | ICD-10-CM | POA: Insufficient documentation

## 2020-11-02 DIAGNOSIS — E119 Type 2 diabetes mellitus without complications: Secondary | ICD-10-CM | POA: Insufficient documentation

## 2020-11-02 DIAGNOSIS — B182 Chronic viral hepatitis C: Secondary | ICD-10-CM | POA: Diagnosis not present

## 2020-11-02 DIAGNOSIS — Z86010 Personal history of colonic polyps: Secondary | ICD-10-CM | POA: Insufficient documentation

## 2020-11-02 DIAGNOSIS — R809 Proteinuria, unspecified: Secondary | ICD-10-CM | POA: Diagnosis not present

## 2020-11-02 DIAGNOSIS — R000 Tachycardia, unspecified: Secondary | ICD-10-CM | POA: Insufficient documentation

## 2020-11-02 DIAGNOSIS — Z89612 Acquired absence of left leg above knee: Secondary | ICD-10-CM | POA: Insufficient documentation

## 2020-11-02 DIAGNOSIS — R109 Unspecified abdominal pain: Secondary | ICD-10-CM | POA: Diagnosis not present

## 2020-11-02 DIAGNOSIS — Z7982 Long term (current) use of aspirin: Secondary | ICD-10-CM | POA: Diagnosis not present

## 2020-11-02 DIAGNOSIS — K219 Gastro-esophageal reflux disease without esophagitis: Secondary | ICD-10-CM | POA: Insufficient documentation

## 2020-11-02 DIAGNOSIS — D500 Iron deficiency anemia secondary to blood loss (chronic): Secondary | ICD-10-CM | POA: Insufficient documentation

## 2020-11-02 DIAGNOSIS — G4700 Insomnia, unspecified: Secondary | ICD-10-CM | POA: Diagnosis not present

## 2020-11-02 DIAGNOSIS — E876 Hypokalemia: Secondary | ICD-10-CM | POA: Diagnosis not present

## 2020-11-02 DIAGNOSIS — N189 Chronic kidney disease, unspecified: Secondary | ICD-10-CM | POA: Insufficient documentation

## 2020-11-02 DIAGNOSIS — K922 Gastrointestinal hemorrhage, unspecified: Secondary | ICD-10-CM | POA: Diagnosis present

## 2020-11-02 LAB — COMPREHENSIVE METABOLIC PANEL
ALT: 18 U/L (ref 0–44)
AST: 22 U/L (ref 15–41)
Albumin: 3.5 g/dL (ref 3.5–5.0)
Alkaline Phosphatase: 69 U/L (ref 38–126)
Anion gap: 12 (ref 5–15)
BUN: 28 mg/dL — ABNORMAL HIGH (ref 8–23)
CO2: 23 mmol/L (ref 22–32)
Calcium: 9.1 mg/dL (ref 8.9–10.3)
Chloride: 106 mmol/L (ref 98–111)
Creatinine, Ser: 1.08 mg/dL — ABNORMAL HIGH (ref 0.44–1.00)
GFR, Estimated: 56 mL/min — ABNORMAL LOW (ref >60)
Glucose, Bld: 132 mg/dL — ABNORMAL HIGH (ref 70–99)
Potassium: 4 mmol/L (ref 3.5–5.1)
Sodium: 141 mmol/L (ref 135–145)
Total Bilirubin: 0.3 mg/dL (ref 0.3–1.2)
Total Protein: 7.2 g/dL (ref 6.5–8.1)

## 2020-11-02 LAB — FERRITIN: Ferritin: 364 ng/mL — ABNORMAL HIGH (ref 11–307)

## 2020-11-02 LAB — CBC WITH DIFFERENTIAL (CANCER CENTER ONLY)
Abs Immature Granulocytes: 0.06 10*3/uL (ref 0.00–0.07)
Basophils Absolute: 0 10*3/uL (ref 0.0–0.1)
Basophils Relative: 1 %
Eosinophils Absolute: 0.1 10*3/uL (ref 0.0–0.5)
Eosinophils Relative: 2 %
HCT: 38.8 % (ref 36.0–46.0)
Hemoglobin: 13.4 g/dL (ref 12.0–15.0)
Immature Granulocytes: 1 %
Lymphocytes Relative: 17 %
Lymphs Abs: 1.2 10*3/uL (ref 0.7–4.0)
MCH: 32.5 pg (ref 26.0–34.0)
MCHC: 34.5 g/dL (ref 30.0–36.0)
MCV: 94.2 fL (ref 80.0–100.0)
Monocytes Absolute: 0.7 10*3/uL (ref 0.1–1.0)
Monocytes Relative: 10 %
Neutro Abs: 5 10*3/uL (ref 1.7–7.7)
Neutrophils Relative %: 69 %
Platelet Count: 267 10*3/uL (ref 150–400)
RBC: 4.12 MIL/uL (ref 3.87–5.11)
RDW: 12.6 % (ref 11.5–15.5)
WBC Count: 7.2 10*3/uL (ref 4.0–10.5)
nRBC: 0 % (ref 0.0–0.2)

## 2020-11-02 LAB — TOTAL PROTEIN, URINE DIPSTICK: Protein, ur: 300 mg/dL — AB

## 2020-11-02 NOTE — Telephone Encounter (Signed)
This nurse attempted to call patient related to lab results.  Left message to return call to clinic.  Will attempt to call again.

## 2020-11-04 ENCOUNTER — Telehealth: Payer: Self-pay

## 2020-11-04 NOTE — Telephone Encounter (Signed)
This nurse spoke with patient and informed of Lottie Mussel, NP results and recommendations.  No further questions or concerns at this time.  Patient knows to call clinic with any problems, questons or concerns.

## 2020-11-04 NOTE — Telephone Encounter (Signed)
-----   Message from Alla Feeling, NP sent at 11/02/2020  1:51 PM EDT ----- Please let her know labs look good, no anemia. She does not need blood, iron, or avastin now. Next lab and f/up in 3 weeks.  Thanks, Regan Rakers, NP

## 2020-11-08 ENCOUNTER — Other Ambulatory Visit: Payer: Self-pay

## 2020-11-08 ENCOUNTER — Telehealth: Payer: Self-pay

## 2020-11-08 ENCOUNTER — Ambulatory Visit: Payer: Medicare Other | Admitting: Behavioral Health

## 2020-11-08 DIAGNOSIS — F411 Generalized anxiety disorder: Secondary | ICD-10-CM

## 2020-11-08 NOTE — Telephone Encounter (Signed)
Dr. Theodis Shove presented to triage office and asked RN to call patient.  She had a telehealth appt w/ Dr. Theodis Shove and she was concerned about her health.  RTC,  Patient was hospitalized from 5/19-5/21 for paresthesia and hypocalcemia and had a HFU on 10/19/20 with Dr. Charleen Kirks.  She c/o diarrhea with onset 2 days after her HFU.  She states she has "diarrheas daily from 0200-1200 and it just runs out of her.  I have stomach cramping also".  Denies any N/V, states she is drinking water.  RN advised patient to go to the ED as she might need IV fluids and checked for Cdiff and electrolyte imbalance.  Pt refuses, states she has a f/u on 11/17/20 and will just come here.  No openings in [facility] today. Will forward to Dr. Collene Gobble on yellow team and ask him to please call patient to speak to her. Thank you! Haris Baack

## 2020-11-08 NOTE — BH Specialist Note (Signed)
Integrated Behavioral Health via Telemedicine Visit  11/08/2020 [name] [id number]  Number of [place] visits: 1/6 Session Start time: 10:30am  Session End time: 11:00am Total time: 30  Referring Provider: Dr. Jose Persia, MD Patient/Family location: Pt is home in bed [facility] Provider location: [facility] All persons participating in visit: Pt & Clinician Types of Service: Health Promotion and Introduction only  I connected with [name] and/or [name]  self  via  Telephone or Video Enabled Telemedicine Application  (Video is Caregility application) and verified that I am speaking with the correct person using two identifiers. Discussed confidentiality: Yes   I discussed the limitations of telemedicine and the availability of in person appointments.  Discussed there is a possibility of technology failure and discussed alternative modes of communication if that failure occurs.  I discussed that engaging in this telemedicine visit, they consent to the provision of behavioral healthcare and the services will be billed under their insurance.  Patient and/or legal guardian expressed understanding and consented to Telemedicine visit: Yes   Presenting Concerns: Patient and/or family reports the following symptoms/concerns: Pt has exp'd diarrhea sicne her d/c from the ED on 10/16/20. Pt c/o cycle of diarrhea that starts @ 2:00am & lasts until 2:00pm in the afternoon. Pt is frustrated & in discomfort. Duration of problem: almost one mos; Severity of problem: moderate to severe  Addressed issue w/Triage RN Higinio Roger (see Triage Notes & Dr. Lorelee Cover TC with Pt)  Patient and/or Family's Strengths/Protective Factors: Concrete supports in place (healthy food, safe environments, etc.) and Pt is advocating for self & admits she is tired, getting little sleep, & uncomfortable.  Goals Addressed: Patient will:  Reduce symptoms of: stress and navigate  Pt to medical Triage.    Increase knowledge and/or ability of: stress reduction and medical mgmt of diarrhea.    Demonstrate ability to:  listen to advice of Physician & RN since you are hurting.  Progress towards Goals: Estb'd today: Pt willing to speak w/Medical Professionals to solve health issue today that has been ongoing.  Interventions: Interventions utilized:  Mindfulness or Psychologist, educational and Supportive Counseling Standardized Assessments completed:  screeners prn  Patient and/or Family Response: Pt is responsive, but c/o health issue invlg diarrhea for almost a mos  Assessment: Patient currently experiencing lack of sleep due to episodes of diarrhea. Immodium is not helping.  Patient may benefit from visit to [facility] or visit to ED for fluids & labs to r/o possible infection w/C-Diff.  Plan: Follow up with behavioral health clinician on : 11/17/20 @ 1:45pm when she next visits [facility] for appt w/Dr. Collene Gobble Behavioral recommendations: none @ this time-stay hydrated Referral(s):  none @ this time  I discussed the assessment and treatment plan with the patient and/or parent/guardian. They were provided an opportunity to ask questions and all were answered. They agreed with the plan and demonstrated an understanding of the instructions.   They were advised to call back or seek an in-person evaluation if the symptoms worsen or if the condition fails to improve as anticipated.  Donnetta Hutching, LMFT

## 2020-11-08 NOTE — Telephone Encounter (Signed)
Discussed case with RN Leonard Downing. I gave Ms. Piazza a call to further discuss her symptoms. She reports diarrhea that has been going on "for awhile" and has not stopped. Mentions she has been constantly in the bathroom every day with minimal relief. She is having some abdominal pain, denies any fevers. She has been able to eat and drink some during this time.  I asked Ms. Yontz if there were any family members or friends nearby that could help her. She reports of a caregiver, [facility], but notes that she does not want [place] to call her. I told Ms. Dewalt that we will not call anybody she does not want [place] to. She soon thereafter became upset with me, stating that [facility] would not see her. I emphasized that [facility] will see her during her appointment, but her current condition required a higher level of care.  I continued discussing with Ms. Tashiro the importance of being evaluated in person for this issue, as she is likely is very dehydrated with possible electrolyte abnormalities. She is adamant that she does not need to go to the Emergency Room and she has an appointment with [place] on the 23rd. I explained that her condition sounds more serious than what we can take care of in the clinic and she needs more emergent evaluation. I explained the risks of not being evaluated emergently, including possible cardiovascular events and infection. I told her she needs to continue taking in fluids during this time if she is not going to be evaluated and to call 911 for any emergencies.  Sanjuan Dame, MD Internal Medicine PGY-1 [phone]

## 2020-11-10 ENCOUNTER — Other Ambulatory Visit: Payer: Self-pay | Admitting: Internal Medicine

## 2020-11-10 DIAGNOSIS — M109 Gout, unspecified: Secondary | ICD-10-CM

## 2020-11-16 DIAGNOSIS — Z9189 Other specified personal risk factors, not elsewhere classified: Secondary | ICD-10-CM

## 2020-11-16 NOTE — Progress Notes (Signed)
[facility])                                            [name]                                        Statin Quality Measure Assessment    11/16/2020  [name] 05-Jun-1950 [id number]  Per review of chart and payor information, this patient has been flagged for non-adherence to the following CMS Quality Measure:   [x]  Statin Use in Persons with Diabetes  [x]  Statin Use in Persons with Cardiovascular Disease  The ASCVD Risk score [name], et al., 2013) failed to calculate for the following reasons:   The patient has a prior MI or stroke diagnosis   Currently prescribed statin:  []  Yes [x]  No      History of statin use:            []  Yes [x]  No    This patient has a history of hepatitis C and the CPT code Z86.19 was associated with the [place] on 04/06/2020. Of note, the patient's lipid was last checked 02/05/2019. The patient most recent LFTs are WNL and she is currently not on Hep C medication.   Please consider ONE of the following recommendations:   Initiate high intensity statin Atorvastatin 40mg  once daily, #90, 3 refills   Rosuvastatin 20mg  once daily, #90, 3 refills    Initiate moderate intensity          statin with reduced frequency if prior          statin intolerance 1x weekly, #13, 3 refills   2x weekly, #26, 3 refills   3x weekly, #39, 3 refills    Code for past statin intolerance or other exclusions (required annually)  Drug Induced Myopathy G 19.6   Alcoholic cirrhosis of liver without ascites K 70.30   Other cirrhosis of liver  K 74.69   Toxic liver disease  K 71.9   Unspecified cirrhosis of liver  K 74.60    Thank you for your time,  Kristeen Miss, [place] Cell: [phone]

## 2020-11-17 ENCOUNTER — Encounter: Payer: Self-pay | Admitting: Hematology

## 2020-11-17 ENCOUNTER — Other Ambulatory Visit (HOSPITAL_COMMUNITY): Payer: Self-pay

## 2020-11-17 ENCOUNTER — Encounter: Payer: Self-pay | Admitting: Internal Medicine

## 2020-11-17 ENCOUNTER — Ambulatory Visit (INDEPENDENT_AMBULATORY_CARE_PROVIDER_SITE_OTHER): Payer: Medicare Other | Admitting: Internal Medicine

## 2020-11-17 ENCOUNTER — Other Ambulatory Visit: Payer: Self-pay

## 2020-11-17 ENCOUNTER — Ambulatory Visit: Payer: Medicare Other | Admitting: Behavioral Health

## 2020-11-17 VITALS — BP 123/81 | HR 99 | Temp 98.6°F | Ht 62.0 in | Wt 159.8 lb

## 2020-11-17 DIAGNOSIS — E559 Vitamin D deficiency, unspecified: Secondary | ICD-10-CM | POA: Diagnosis not present

## 2020-11-17 DIAGNOSIS — I10 Essential (primary) hypertension: Secondary | ICD-10-CM

## 2020-11-17 DIAGNOSIS — F331 Major depressive disorder, recurrent, moderate: Secondary | ICD-10-CM

## 2020-11-17 DIAGNOSIS — F329 Major depressive disorder, single episode, unspecified: Secondary | ICD-10-CM

## 2020-11-17 DIAGNOSIS — Z630 Problems in relationship with spouse or partner: Secondary | ICD-10-CM

## 2020-11-17 DIAGNOSIS — F419 Anxiety disorder, unspecified: Secondary | ICD-10-CM

## 2020-11-17 DIAGNOSIS — E1140 Type 2 diabetes mellitus with diabetic neuropathy, unspecified: Secondary | ICD-10-CM | POA: Diagnosis not present

## 2020-11-17 MED ORDER — CETIRIZINE HCL 10 MG PO TABS
10.0000 mg | ORAL_TABLET | Freq: Every day | ORAL | 0 refills | Status: DC
  Filled 2020-11-17 (×2): qty 30, 30d supply, fill #0

## 2020-11-17 MED ORDER — SERTRALINE HCL 50 MG PO TABS
50.0000 mg | ORAL_TABLET | Freq: Every day | ORAL | 0 refills | Status: DC
  Filled 2020-11-17: qty 90, 90d supply, fill #0

## 2020-11-17 NOTE — Patient Instructions (Signed)
[name],  It was a pleasure seeing you in clinic. Today we discussed:   Decreased appetite:  Depression:  I am sorry that you are going through this difficult situation. I will check some lab work today. I am also starting you on a medication to help with depression. Please take this daily.   I will call you with any abnormal results.   If you have any questions or concerns, please call our clinic at [phone] between 9am-5pm and after hours call [phone] and ask for the internal medicine resident on call. If you feel you are having a medical emergency please call 911.   Thank you, we look forward to helping you remain healthy!

## 2020-11-17 NOTE — Assessment & Plan Note (Signed)
Initial concerns for profuse diarrhea and abdominal pain on 6/14 for which she was recommended for urgent evaluation. However, patient preferred to follow up at today's visit. She reports resolution of the diarrhea and is no longer having any abdominal pain. She reports decreased appetite but this is likely in setting of her ongoing depression as above. No weight loss noted. However, will check electrolytes today given recent hospitalization for hypocalcemia and recent diarrhea.  Plan: BMP today

## 2020-11-17 NOTE — BH Specialist Note (Signed)
Integrated Behavioral Health Follow Up In-Person Visit  MRN: [id number] Name: [name]  Number of [place] Clinician visits: 2/6 Session Start time: 3:15pm  Session End time: 3:45pm Total time: 30 minutes  Types of Service: Collaborative care @ the request of Dr. Harvie Heck, MD & Sander Nephew, RN  Interpretor:No. Interpretor Name and Language: n/a  Subjective: [name] is a 70 y.o. female accompanied by  self Patient was referred by Dr. Harvie Heck, MD for mental health wellness check & sadness due to beh choices of Sig Other. Patient reports the following symptoms/concerns: Pt is upset over the dec-mkg of her Sig Other who lives w/her for 4 yrs now. This week he has arrived home in the early morning, missed work, & told her mistruths. Pt has a heavy heart & anger about these decisions.  Duration of problem: a week; Severity of problem: moderate  Objective: Mood: Angry, Anxious, and Depressed and Affect: Appropriate Risk of harm to self or others: Thoughts of violence towards others, but Pt declined this statement once we discussed how much the situation is bothering her & how hurting Sig Other would also hurt herself. Pt just wants to talk w/Sig Other & communicate her needs/concerns for the relationship.  Life Context: Family and Social: Pt lives in residence w/her Sig Other; until this week, she has seen no obvious signs he is Event organiser. Partner has been hanging out w/a new friend she does not trust. Emphasized to Pt her own worth & Partner's lack of good judgment. Pt needs to protect her own heart. School/Work: Pt does not attend school or work @ this time. Pt has L AKA & uses an electric scooter to ambulate. Self-Care: Pt encouraged to do all good things for herself. She is going to get her medicien & then go home to eat. Her consumption has been limited to liq supplements this week, since Monday. Life Changes: Pt has been confronted  w/suspicious beh from Sig Other  Patient and/or Family's Strengths/Protective Factors: Social and Emotional competence, Concrete supports in place (healthy food, safe environments, etc.), Sense of purpose, and Physical Health (exercise, healthy diet, medication compliance, etc.)  Goals Addressed: Patient will:  Reduce symptoms of: anxiety, depression, stress, and how to advocate for herself as a woman & not accept beh she disapproves of or diminishes her truth.    Increase knowledge and/or ability of: coping skills, self-management skills, stress reduction, and communication tools for a positive discussion.    Demonstrate ability to: Increase healthy adjustment to current life circumstances and Begin healthy grieving over loss  Progress towards Goals: Ongoing  Interventions: Interventions utilized:  Solution-Focused Strategies, Behavioral Activation, and Supportive Counseling Standardized Assessments completed:  screeners prn  Patient and/or Family Response: Pt receptive to urgent OV today after seeing Dr. Harvie Heck, MD. Sander Nephew, RN alerted this Clinician to need for Eval/Assessment for anguish. Pt is estb'd w/this Clinician & able to provide needed Supportive Cslg.  Patient Centered Plan: Patient is on the following Treatment Plan(s): We will speak f:f @ next session. Pt will use her best good judgment re: Sig Others beh. She will put herself first & not hurt anyone. Pt exited [facility] w/some humor & her hurt feelings validated. Assessment: Patient currently experiencing deep sorrow for Sig Other's suspicious beh that alerted her to relationship fracture. Pt has noticed his beh this week w/his Co-Worker as different after 4 yrs. Encouraged Pt to care for self & set her own boundaries around his  beh so as to diminish her exp of hurt & anger.  Patient may benefit from cont'd attn to this matter @ next visit. Pt will examine the signs she exp'd prior to this week & learn from them. She will  stick w/her own personal truth & she realizes Sig Other is treating her badly.  Plan: Follow up with behavioral health clinician on : 2-3 wks, f:f for 60 min Behavioral recommendations: Keep calm & do not allow Sig Other to treat you badly. Estb firm boundaries about what you will accept & not in the relationship.Do not enable Sig Other by teaching him you will be a doormat for him to keep his love. Love yourself first & expect his respectful Tx of you @ all times.  Referral(s): [place] (In Clinic) "From scale of 1-10, how likely are you to follow plan?": [place], LMFT

## 2020-11-17 NOTE — Assessment & Plan Note (Addendum)
Ms Bridwell is very tearful on exam today. She endorses interpersonal conflict with her boyfriend and is concerned that he is no longer interested in her and is seeing other women. She is very concerned about this and notes that as a result, she has been crying until her head hurts and is getting only a few hours of sleep per night. She notes significantly decreased appetite as well and notes that she is mostly just drinking glucerna shakes. PHQ-9 is 18 at this visit. She notes feeling safe in the relationship and denies any suicidal ideation at this time. She does see Dr Theodis Shove and would like to follow up with her. She is also requesting pharmacotherapy at this time.   Plan: Patient to see Dr Theodis Shove today Start sertraline 50mg  daily  Checking Vitamin D levels  Follow up in 4 weeks

## 2020-11-17 NOTE — Progress Notes (Signed)
   CC: decreased appetite, depression  HPI:  [name] is a 70 y.o. female with PMHx as stated below presenting for decreased appetite over the past week and depression in setting of interpersonal conflict with her partner. Please see problem based charting for complete assessment and plan.   Past Medical History:  Diagnosis Date   Allergic rhinitis    Anemia, iron deficiency 05/03/2011   2/2 GI AVMs - recieves iron infusions as well as periodic red blood cell transfusions   Anxiety    Arteriovenous malformation of duodenum 02/25/2015   CARPAL TUNNEL RELEASE, HX OF 07/22/2008   Chest pain 10/28/2019   Colon polyps    Diabetes mellitus without complication ([place])    type 2   Difficulty sleeping    takes trazadone for sleep   Diverticulosis    Fall 01/03/2020   GERD (gastroesophageal reflux disease)    Hepatitis C    in SVR with Harvoni 12/2014-02/2015.    History of hernia repair 08/18/2012   History of porphyria 11/30/2014   Porphyria cutanea tarda in setting of chronic Hepatitis C, frequent  IV iron infusions, and alcohol abuse    Hypertension    Hypocalcemia 10/28/2019   Hypokalemia 09/27/2014   Peripheral vascular disease ([place])    Stroke ([place]) 2010   no residual deficits   Substance use disorder    cocaine   Tachycardia 07/10/2017   Trichomonas vaginitis 04/16/2019   Review of Systems:  Negative except as stated in HPI.  Physical Exam:  Vitals:   11/17/20 1418  BP: 123/81  Pulse: 99  Temp: 98.6 F (37 C)  TempSrc: Oral  SpO2: 100%  Weight: 159 lb 12.8 oz (72.5 kg)  Height: 5\' 2"  (1.575 m)   Physical Exam  Constitutional: elderly female, wheelchair bound, no acute distress  HENT: Normocephalic and atraumatic, EOMI, conjunctiva normal, moist mucous membranes Cardiovascular: Normal rate, regular rhythm, S1 and S2 present, no murmurs, rubs, gallops.  Distal pulses intact Respiratory:  Effort is normal on room air. Lungs are clear to auscultation bilaterally. GI:  Nondistended, soft, nontender to palpation, normoactive bowel sounds Musculoskeletal: Normal bulk and tone.  Left AKA w/prosthesis in place.  Neurological: Is alert and oriented x4, no apparent focal deficits noted. Skin: Warm and dry.  No rash, erythema, lesions noted. Psychiatric: Tearful, depressed mood.   Assessment & Plan:   See Encounters Tab for problem based charting.  Patient discussed with Dr. Philipp Ovens

## 2020-11-17 NOTE — Assessment & Plan Note (Addendum)
BP Readings from Last 3 Encounters:  11/17/20 123/81  10/19/20 134/76  10/15/20 (!) 161/86   [name] is currently on amlodipine and losartan daily. Home BP logs have ranged systolic 921-194R. She denies any chest pain, shortness of breath or headaches.   Plan:  Continue losartan 100mg  daily and amlodipine 10mg  daily Home BP log monitoring Lipid panel today Follow up in 4 weeks

## 2020-11-18 ENCOUNTER — Encounter: Payer: Self-pay | Admitting: Hematology

## 2020-11-22 NOTE — Progress Notes (Signed)
[facility]   Telephone:(336) [phone] Fax:(336) [phone]   Clinic Follow up Note   Patient Care Team: Jose Persia, MD as PCP - Evalina Field, MD as Consulting Physician (Ophthalmology) Coralie Keens, MD as Consulting Physician (General Surgery) Truitt Merle, MD as Consulting Physician (Hematology) Comer, Okey Regal, MD as Consulting Physician (Infectious Diseases)  Date of Service:  11/23/2020  CHIEF COMPLAINT:  F/U for Anemia   CURRENT THERAPY:  -IV Feraheme as needed, last dose 09/12/20 -Blood transfusions as needed, last 08/05/20 -Bevacizumab (Zirabev) starting 08/10/20. Held since 08/24/20 due to increased BP and proteinuria. May restart bevacizumab if she develops GI bleeding again.  INTERVAL HISTORY: [name] is here for a follow up of anemia. She was last seen by me last month. She presents to the clinic alone. The patient notes her abdominal pain has since resolved since last visit. She has recently been seen by psychiatrist for much stress and has been put on Zolaft. She has a f/u with them in three weeks in July. The patient notes much improvement in her mood and how she feels since last visit.    REVIEW OF SYSTEMS: Constitutional: Denies fevers, chills or abnormal weight loss Eyes: Denies blurriness of vision Ears, nose, mouth, throat, and face: Denies mucositis or sore throat Respiratory: Denies cough, dyspnea or wheezes Cardiovascular: Denies palpitation, chest discomfort or lower extremity swelling Gastrointestinal:  Denies nausea, heartburn or change in bowel habits Skin: Denies abnormal skin rashes Lymphatics: Denies new lymphadenopathy or easy bruising Neurological:Denies numbness, tingling or new weaknesses Behavioral/Psych: Mood is stable, no new changes  All other systems were reviewed with the patient and are negative.  MEDICAL HISTORY:  Past Medical History:  Diagnosis Date   Allergic rhinitis    Anemia, iron deficiency  05/03/2011   2/2 GI AVMs - recieves iron infusions as well as periodic red blood cell transfusions   Anxiety    Arteriovenous malformation of duodenum 02/25/2015   CARPAL TUNNEL RELEASE, HX OF 07/22/2008   Chest pain 10/28/2019   Colon polyps    Diabetes mellitus without complication ([place])    type 2   Difficulty sleeping    takes trazadone for sleep   Diverticulosis    Fall 01/03/2020   GERD (gastroesophageal reflux disease)    Hepatitis C    in SVR with Harvoni 12/2014-02/2015.    History of hernia repair 08/18/2012   History of porphyria 11/30/2014   Porphyria cutanea tarda in setting of chronic Hepatitis C, frequent  IV iron infusions, and alcohol abuse    Hypertension    Hypocalcemia 10/28/2019   Hypokalemia 09/27/2014   Peripheral vascular disease ([place])    Stroke ([place]) 2010   no residual deficits   Substance use disorder    cocaine   Tachycardia 07/10/2017   Trichomonas vaginitis 04/16/2019    SURGICAL HISTORY: Past Surgical History:  Procedure Laterality Date   ABDOMINAL HYSTERECTOMY     APPLICATION OF WOUND VAC N/A 06/21/2014   Procedure: APPLICATION OF WOUND VAC;  Surgeon: Coralie Keens, MD;  Location: [facility];  Service: General;  Laterality: N/A;   CARPAL TUNNEL RELEASE     rt hand   COLONOSCOPY WITH PROPOFOL N/A 03/17/2018   Procedure: COLONOSCOPY WITH PROPOFOL;  Surgeon: Doran Stabler, MD;  [facility];  Service: Gastroenterology;  Laterality: N/A;   ENTEROSCOPY N/A 12/04/2012   Procedure: ENTEROSCOPY;  Surgeon: Beryle Beams, MD;  [facility];  Service: Endoscopy;  Laterality: N/A;  ENTEROSCOPY N/A 12/18/2012   Procedure: ENTEROSCOPY;  Surgeon: Patrick D Hung, MD;  [facility];  Service: Endoscopy;  Laterality: N/A;   ENTEROSCOPY N/A 02/25/2015   Procedure: ENTEROSCOPY;  Surgeon: Robert D Kaplan, MD;  [facility];  Service: Endoscopy;  Laterality: N/A;   ENTEROSCOPY N/A 10/29/2017   Procedure: ENTEROSCOPY;  Surgeon: Danis,  Henry L III, MD;  [facility];  Service: Gastroenterology;  Laterality: N/A;   ENTEROSCOPY N/A 07/08/2020   Procedure: ENTEROSCOPY;  Surgeon: Jacobs, Daniel P, MD;  [facility];  Service: Endoscopy;  Laterality: N/A;   ESOPHAGOGASTRODUODENOSCOPY  05/05/2011   Procedure: ESOPHAGOGASTRODUODENOSCOPY (EGD);  Surgeon: Jay Pyrtle, MD;  [facility];  Service: Gastroenterology;  Laterality: N/A;  Dr. Pyrtle will do procedure for Dr. Hung Saturday.   ESOPHAGOGASTRODUODENOSCOPY  05/08/2011   Procedure: ESOPHAGOGASTRODUODENOSCOPY (EGD);  Surgeon: Patrick D Hung;  [facility];  Service: Endoscopy;  Laterality: N/A;   ESOPHAGOGASTRODUODENOSCOPY  06/07/2011   Procedure: ESOPHAGOGASTRODUODENOSCOPY (EGD);  Surgeon: Patrick D Hung, MD;  [facility];  Service: Endoscopy;  Laterality: N/A;   ESOPHAGOGASTRODUODENOSCOPY  12/20/2011   Procedure: ESOPHAGOGASTRODUODENOSCOPY (EGD);  Surgeon: Patrick D Hung, MD;  [facility];  Service: Endoscopy;  Laterality: N/A;   EYE SURGERY Bilateral    cataracts   FLEXIBLE SIGMOIDOSCOPY  12/21/2011   Procedure: FLEXIBLE SIGMOIDOSCOPY;  Surgeon: Patrick D Hung, MD;  [facility];  Service: Endoscopy;  Laterality: N/A;   HOT HEMOSTASIS  06/07/2011   Procedure: HOT HEMOSTASIS (ARGON PLASMA COAGULATION/BICAP);  Surgeon: Patrick D Hung, MD;  [facility];  Service: Endoscopy;  Laterality: N/A;   HOT HEMOSTASIS N/A 07/08/2020   Procedure: HOT HEMOSTASIS (ARGON PLASMA COAGULATION/BICAP);  Surgeon: Jacobs, Daniel P, MD;  [facility];  Service: Endoscopy;  Laterality: N/A;   INCISIONAL HERNIA REPAIR N/A 06/01/2014   Procedure: ATTEMPTED LAPAROSCOPIC AND OPEN INCISIONAL HERNIA REPAIR WITH MESH;  Surgeon: Douglas Blackman, MD;  [facility];  Service: General;  Laterality: N/A;   INSERTION OF MESH N/A 06/01/2014   Procedure: INSERTION OF MESH;  Surgeon: Douglas Blackman, MD;  [facility];  Service:  General;  Laterality: N/A;   LAPAROTOMY  02/16/2012   Procedure: EXPLORATORY LAPAROTOMY;  Surgeon: Benjamin T Hoxworth, MD;  [facility];  Service: General;  Laterality: N/A;  oversewing of anastomotic leak and rigid sigmoidoscopy   LAPAROTOMY N/A 06/21/2014   Procedure: ABDOMINAL WOUND EXPLORATION;  Surgeon: Douglas Blackman, MD;  [facility];  Service: General;  Laterality: N/A;   LEG AMPUTATION ABOVE KNEE     left   PARTIAL COLECTOMY  02/15/2012   Procedure: PARTIAL COLECTOMY;  Surgeon: Douglas A Blackman, MD;  [facility];  Service: General;  Laterality: N/A;   SUBMUCOSAL INJECTION  07/08/2020   Procedure: SUBMUCOSAL INJECTION;  Surgeon: Jacobs, Daniel P, MD;  [facility];  Service: Endoscopy;;   TONSILLECTOMY      I have reviewed the social history and family history with the patient and they are unchanged from previous note.  ALLERGIES:  is allergic to ciprofloxacin, morphine and related, penicillins, codeine, lisinopril, and feraheme [ferumoxytol].  MEDICATIONS:  Current Outpatient Medications  Medication Sig Dispense Refill   ACCU-CHEK FASTCLIX LANCETS MISC Use to test blood glucose 3 times daily. 100 each 11   ACCU-CHEK GUIDE test strip USE TO TEST BLOOD SUGAR THREE TIMES A DAY 100 strip 3   amLODipine (NORVASC) 10 MG tablet Take 1 tablet (10 mg total) by mouth daily. 90 tablet   3   aspirin EC 81 MG tablet Take 1 tablet (81 mg total) by mouth daily. 30 tablet 1   Blood Glucose Monitoring Suppl (ACCU-CHEK GUIDE) w/Device KIT 1 each by Does not apply route 3 (three) times daily. The patient is insulin requiring, ICD 10 code E11.65. The patient tests 3 times a day. 1 kit 1   cetirizine (ZYRTEC) 10 MG tablet Take 1 tablet (10 mg total) by mouth daily. 30 tablet 0   Dulaglutide 0.75 MG/0.5ML SOPN INJECT 0.75 MG INTO THE SKIN ONCE A WEEK. (Patient taking differently: Inject 0.75 mg into the skin once a week. Sunday) 6 mL 3   feeding supplement, GLUCERNA SHAKE,  (GLUCERNA SHAKE) LIQD Take 237 mLs by mouth 3 (three) times daily between meals. (Patient taking differently: Take 237 mLs by mouth 2 (two) times daily between meals.) 90 Can 0   gabapentin (NEURONTIN) 400 MG capsule Take 1 tablet in the morning, 1 tablet in the afternoon and 2 tablets before bedtime. 90 capsule 0   losartan (COZAAR) 100 MG tablet TAKE 1 TABLET (100 MG TOTAL) BY MOUTH DAILY. (Patient taking differently: Take 100 mg by mouth daily.) 90 tablet 3   nicotine (NICODERM CQ - DOSED IN MG/24 HOURS) 14 mg/24hr patch Place 1 patch (14 mg total) onto the skin daily. 28 patch 0   omeprazole (PRILOSEC) 20 MG capsule Take 1 capsule (20 mg total) by mouth daily. 90 capsule 1   sertraline (ZOLOFT) 50 MG tablet Take 1 tablet (50 mg total) by mouth daily. 90 tablet 0   No current facility-administered medications for this visit.    PHYSICAL EXAMINATION: ECOG PERFORMANCE STATUS: 2  Vitals:   11/23/20 0915  BP: (!) 144/74  Pulse: 87  Resp: 19  Temp: 97.9 F (36.6 C)  SpO2: 100%   Filed Weights   11/23/20 0915  Weight: 161 lb 8 oz (73.3 kg)    Exam was given in a wheelchair.   GENERAL:alert, no distress and comfortable SKIN: skin color, texture, turgor are normal, no rashes or significant lesions HEART: regular rate & rhythm and no murmurs and no lower extremity edema ABDOMEN:abdomen soft, non-tender and normal bowel sounds NEURO: alert & oriented x 3 with fluent speech, no focal motor/sensory deficits  LABORATORY DATA:  I have reviewed the data as listed CBC Latest Ref Rng & Units 11/23/2020 11/02/2020 10/14/2020  WBC 4.0 - 10.5 K/uL 7.5 7.2 8.3  Hemoglobin 12.0 - 15.0 g/dL 10.4(L) 13.4 12.5  Hematocrit 36.0 - 46.0 % 30.3(L) 38.8 36.1  Platelets 150 - 400 K/uL 301 267 221     CMP Latest Ref Rng & Units 11/17/2020 11/02/2020 10/19/2020  Glucose 65 - 99 mg/dL 89 132(H) 87  BUN 8 - 27 mg/dL 24 28(H) 19  Creatinine 0.57 - 1.00 mg/dL 1.18(H) 1.08(H) 1.01(H)  Sodium 134 - 144 mmol/L  141 141 140  Potassium 3.5 - 5.2 mmol/L 3.7 4.0 4.8  Chloride 96 - 106 mmol/L 101 106 100  CO2 20 - 29 mmol/L 21 23 22  Calcium 8.7 - 10.3 mg/dL 9.1 9.1 9.6  Total Protein 6.5 - 8.1 g/dL - 7.2 6.9  Total Bilirubin 0.3 - 1.2 mg/dL - 0.3 <0.2  Alkaline Phos 38 - 126 U/L - 69 73  AST 15 - 41 U/L - 22 18  ALT 0 - 44 U/L - 18 15      RADIOGRAPHIC STUDIES: I have personally reviewed the radiological images as listed and agreed with the findings in the report.   No results found.   ASSESSMENT & PLAN:  SHAKIAH WESTER is a 70 y.o. female with     1. Iron deficiency anemia secondary to GI blood loss, and Anemia of Chronic Disease -History of multiple GI bleeding in 2016, required blood transfusion, hospitalization and IV Feraheme -She had an EGD and colonoscopy on 10/29/2017 and 03/17/2018 with Dr. Loletha Carrow, which was normal. -She endoscopy in [facility] and was found to have AVM in small bowel. -She has been on oral iron for 5-6 years now and has had intermittent black watery stool. -She has been receiving IV Feraheme every 1-2 month on average lately. Goal is to keep her Hg >10 and ferritin>100. Her last IV Feraheme was on 06/29/2020 -After recent melena, she required hospitalization and blood transfusion in 06/2020. Dr Ardis Hughs did upper endoscopy on 07/09/19 and was seen to have non-bleeding AVM. This was cauterized. -She denies any more melena, her anemia worsened. I started her on bevacizumab on 08/10/20. She continues IV Feraheme as needed. -she responded well and anemia has resolved since start of beva. Due to increased BP and proteinuria, Beva was held after two doses  -lab reviewed, Hg 10.4 today. Anemia is worsening again and will need additional IV iron infusions. -Will set up for additional Feraheme infusions. Continue with infusion scheduled today due to worsening anemia. -IV Iron infusion in two weeks.     2. 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  Chronic hepatitis C infection -She tested positive for hepatitis C in 2009. She has previously completed treatment for hepatitis C in 2016 and repeated test was negative. -She had an EGD and colonoscopy on 10/29/2017 and 03/17/2018 with Dr. Loletha Carrow, which was normal. She will continue to follow up with him.   -05/2018 liver US was unremarkable.  -She gets 2 flu shots a year (March and October) and she received her COVID booster in 02/2020.    4. DM2, Trouble sleeping, HTN   -Management per PCP's office.  -Her PCP started her on Valium 33m as needed to help her sleep. I recommend she not use this every night as it can lead to dependence.  -She has baseline HTN. On amlodipine, losartan. I discussed her BP can elevate on Bevacizumab. Given she responded well and does not need often, will monitor her BP at home before adding another HTN medication.    5. Left AKA, has prosthesis, she is wheelchair bound most of time, Right LE Edema -LE edema could be related to nutritional deficiencies or decreased venous return. I encouraged her to elevate her right leg. -On 12/30/19 she had a mechanical fall. For pain she has been on Oxycodone q6hours. Her left shoulder pain has improved but her left stump pain mostly stable.  -She notes she has been using crutches and cane more lately and is working towards walking independently. 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 Acute abdominal pain -Onset week of 10/12/20. Not able to keep down food or liquid. Last BM the day before was normal. She denies fever. -These symptoms have since resolved.     PLAN: -Reviewed labs with patient. She has developed anemia again. Her protein was at 100 today, down from 300. Will restart Beva today and give one more dose in 2 weeks, then change to every 1-2 months as maintenance therapy  -Feraheme today and again in 2 weeks  -Will connect patient to arrange transportation assistance for next  visits. She notes she currently  has to [place].  -F/u with labs and next MD visit in 4 weeks.   No problem-specific Assessment & Plan notes found for this encounter.   No orders of the defined types were placed in this encounter.  All questions were answered. The patient knows to call the clinic with any problems, questions or concerns. No barriers to learning was detected. The total time spent in the appointment was 30 minutes.     Truitt Merle, MD 11/23/2020   I, Reinaldo Raddle, am acting as scribe for Dr. Truitt Merle, MD.

## 2020-11-22 NOTE — Progress Notes (Signed)
Internal Medicine Clinic Attending ° °Case discussed with Dr. Aslam  At the time of the visit.  We reviewed the resident’s history and exam and pertinent patient test results.  I agree with the assessment, diagnosis, and plan of care documented in the resident’s note.  °

## 2020-11-23 ENCOUNTER — Encounter: Payer: Self-pay | Admitting: General Practice

## 2020-11-23 ENCOUNTER — Inpatient Hospital Stay: Payer: Medicare Other

## 2020-11-23 ENCOUNTER — Other Ambulatory Visit: Payer: Self-pay

## 2020-11-23 ENCOUNTER — Encounter: Payer: Self-pay | Admitting: Hematology

## 2020-11-23 ENCOUNTER — Inpatient Hospital Stay (HOSPITAL_BASED_OUTPATIENT_CLINIC_OR_DEPARTMENT_OTHER): Payer: Medicare Other | Admitting: Hematology

## 2020-11-23 VITALS — BP 132/65 | HR 72 | Temp 98.1°F | Resp 17

## 2020-11-23 VITALS — BP 144/74 | HR 87 | Temp 97.9°F | Resp 19 | Ht 62.0 in | Wt 161.5 lb

## 2020-11-23 DIAGNOSIS — D500 Iron deficiency anemia secondary to blood loss (chronic): Secondary | ICD-10-CM | POA: Diagnosis not present

## 2020-11-23 DIAGNOSIS — K922 Gastrointestinal hemorrhage, unspecified: Secondary | ICD-10-CM | POA: Diagnosis not present

## 2020-11-23 LAB — CBC WITH DIFFERENTIAL (CANCER CENTER ONLY)
Abs Immature Granulocytes: 0.07 10*3/uL (ref 0.00–0.07)
Basophils Absolute: 0.1 10*3/uL (ref 0.0–0.1)
Basophils Relative: 1 %
Eosinophils Absolute: 0.1 10*3/uL (ref 0.0–0.5)
Eosinophils Relative: 2 %
HCT: 30.3 % — ABNORMAL LOW (ref 36.0–46.0)
Hemoglobin: 10.4 g/dL — ABNORMAL LOW (ref 12.0–15.0)
Immature Granulocytes: 1 %
Lymphocytes Relative: 14 %
Lymphs Abs: 1.1 10*3/uL (ref 0.7–4.0)
MCH: 32.4 pg (ref 26.0–34.0)
MCHC: 34.3 g/dL (ref 30.0–36.0)
MCV: 94.4 fL (ref 80.0–100.0)
Monocytes Absolute: 0.7 10*3/uL (ref 0.1–1.0)
Monocytes Relative: 9 %
Neutro Abs: 5.6 10*3/uL (ref 1.7–7.7)
Neutrophils Relative %: 73 %
Platelet Count: 301 10*3/uL (ref 150–400)
RBC: 3.21 MIL/uL — ABNORMAL LOW (ref 3.87–5.11)
RDW: 13.8 % (ref 11.5–15.5)
WBC Count: 7.5 10*3/uL (ref 4.0–10.5)
nRBC: 0 % (ref 0.0–0.2)

## 2020-11-23 LAB — TOTAL PROTEIN, URINE DIPSTICK: Protein, ur: 100 mg/dL — AB

## 2020-11-23 LAB — IRON AND TIBC
Iron: 53 ug/dL (ref 41–142)
Saturation Ratios: 20 % — ABNORMAL LOW (ref 21–57)
TIBC: 261 ug/dL (ref 236–444)
UIBC: 209 ug/dL (ref 120–384)

## 2020-11-23 LAB — FERRITIN: Ferritin: 272 ng/mL (ref 11–307)

## 2020-11-23 MED ORDER — DIPHENHYDRAMINE HCL 50 MG/ML IJ SOLN
50.0000 mg | Freq: Once | INTRAMUSCULAR | Status: AC
  Administered 2020-11-23: 50 mg via INTRAVENOUS

## 2020-11-23 MED ORDER — FAMOTIDINE 20 MG PO TABS
ORAL_TABLET | ORAL | Status: AC
  Filled 2020-11-23: qty 1

## 2020-11-23 MED ORDER — DIPHENHYDRAMINE HCL 50 MG/ML IJ SOLN
INTRAMUSCULAR | Status: AC
  Filled 2020-11-23: qty 1

## 2020-11-23 MED ORDER — SODIUM CHLORIDE 0.9 % IV SOLN
510.0000 mg | Freq: Once | INTRAVENOUS | Status: AC
  Administered 2020-11-23: 510 mg via INTRAVENOUS
  Filled 2020-11-23: qty 510

## 2020-11-23 MED ORDER — SODIUM CHLORIDE 0.9 % IV SOLN
5.0000 mg/kg | Freq: Once | INTRAVENOUS | Status: AC
  Administered 2020-11-23: 400 mg via INTRAVENOUS
  Filled 2020-11-23: qty 16

## 2020-11-23 MED ORDER — ACETAMINOPHEN 325 MG PO TABS
ORAL_TABLET | ORAL | Status: AC
  Filled 2020-11-23: qty 2

## 2020-11-23 MED ORDER — SODIUM CHLORIDE 0.9 % IV SOLN
Freq: Once | INTRAVENOUS | Status: AC
Start: 2020-11-23 — End: 2020-11-23
  Filled 2020-11-23: qty 250

## 2020-11-23 MED ORDER — FAMOTIDINE 20 MG PO TABS
20.0000 mg | ORAL_TABLET | Freq: Once | ORAL | Status: AC
  Administered 2020-11-23: 20 mg via ORAL

## 2020-11-23 NOTE — Patient Instructions (Signed)
[facility]  Discharge Instructions: Thank you for choosing [facility] to provide your oncology and hematology care.   If you have a lab appointment with the [facility], please go directly to the [facility] and check in at the registration area.   Wear comfortable clothing and clothing appropriate for easy access to any Portacath or PICC line.   We strive to give you quality time with your provider. You may need to reschedule your appointment if you arrive late (15 or more minutes).  Arriving late affects you and other patients whose appointments are after yours.  Also, if you miss three or more appointments without notifying the office, you may be dismissed from the clinic at the provider's discretion.      For prescription refill requests, have your pharmacy contact our office and allow 72 hours for refills to be completed.    Today you received the following chemotherapy and/or immunotherapy agents : Bevacizumab     To help prevent nausea and vomiting after your treatment, we encourage you to take your nausea medication as directed.  BELOW ARE SYMPTOMS THAT SHOULD BE REPORTED IMMEDIATELY: *FEVER GREATER THAN 100.4 F (38 C) OR HIGHER *CHILLS OR SWEATING *NAUSEA AND VOMITING THAT IS NOT CONTROLLED WITH YOUR NAUSEA MEDICATION *UNUSUAL SHORTNESS OF BREATH *UNUSUAL BRUISING OR BLEEDING *URINARY PROBLEMS (pain or burning when urinating, or frequent urination) *BOWEL PROBLEMS (unusual diarrhea, constipation, pain near the anus) TENDERNESS IN MOUTH AND THROAT WITH OR WITHOUT PRESENCE OF ULCERS (sore throat, sores in mouth, or a toothache) UNUSUAL RASH, SWELLING OR PAIN  UNUSUAL VAGINAL DISCHARGE OR ITCHING   Items with * indicate a potential emergency and should be followed up as soon as possible or go to the Emergency Department if any problems should occur.  Please show the CHEMOTHERAPY ALERT CARD or IMMUNOTHERAPY ALERT CARD at check-in  to the Emergency Department and triage nurse.  Should you have questions after your visit or need to cancel or reschedule your appointment, please contact [facility]  Dept: [phone]  and follow the prompts.  Office hours are 8:00 a.m. to 4:30 p.m. Monday - Friday. Please note that voicemails left after 4:00 p.m. may not be returned until the following business day.  We are closed weekends and major holidays. You have access to a nurse at all times for urgent questions. Please call the main number to the clinic Dept: [phone] and follow the prompts.   For any non-urgent questions, you may also contact your provider using MyChart. We now offer e-Visits for anyone 31 and older to request care online for non-urgent symptoms. For details visit [email or web address].   Also download the MyChart app! Go to the app store, search "MyChart", open the app, select [facility], and log in with your MyChart username and password.  Due to Covid, a mask is required upon entering the hospital/clinic. If you do not have a mask, one will be given to you upon arrival. For doctor visits, patients may have 1 support person aged 70 or older with them. For treatment visits, patients cannot have anyone with them due to current Covid guidelines and our immunocompromised population.   Ferumoxytol injection What is this medication? FERUMOXYTOL is an iron complex. Iron is used to make healthy red blood cells, which carry oxygen and nutrients throughout the body. This medicine is used totreat iron deficiency anemia. This medicine may be used for other purposes; ask your health care provider  orpharmacist if you have questions. COMMON BRAND NAME(S): Feraheme What should I tell my care team before I take this medication? They need to know if you have any of these conditions: anemia not caused by low iron levels high levels of iron in the blood magnetic resonance imaging (MRI) test  scheduled an unusual or allergic reaction to iron, other medicines, foods, dyes, or preservatives pregnant or trying to get pregnant breast-feeding How should I use this medication? This medicine is for injection into a vein. It is given by a health careprofessional in a hospital or clinic setting. Talk to your pediatrician regarding the use of this medicine in children.Special care may be needed. Overdosage: If you think you have taken too much of this medicine contact apoison control center or emergency room at once. NOTE: This medicine is only for you. Do not share this medicine with others. What if I miss a dose? It is important not to miss your dose. Call your doctor or health careprofessional if you are unable to keep an appointment. What may interact with this medication? This medicine may interact with the following medications: other iron products This list may not describe all possible interactions. Give your health care provider a list of all the medicines, herbs, non-prescription drugs, or dietary supplements you use. Also tell them if you smoke, drink alcohol, or use illegaldrugs. Some items may interact with your medicine. What should I watch for while using this medication? Visit your doctor or healthcare professional regularly. Tell your doctor or healthcare professional if your symptoms do not start to get better or if theyget worse. You may need blood work done while you are taking this medicine. You may need to follow a special diet. Talk to your doctor. Foods that contain iron include: whole grains/cereals, dried fruits, beans, or peas, leafy greenvegetables, and organ meats (liver, kidney). What side effects may I notice from receiving this medication? Side effects that you should report to your doctor or health care professionalas soon as possible: allergic reactions like skin rash, itching or hives, swelling of the face, lips, or tongue breathing problems changes in blood  pressure feeling faint or lightheaded, falls fever or chills flushing, sweating, or hot feelings swelling of the ankles or feet Side effects that usually do not require medical attention (report to yourdoctor or health care professional if they continue or are bothersome): diarrhea headache nausea, vomiting stomach pain This list may not describe all possible side effects. Call your doctor for medical advice about side effects. You may report side effects to FDA at1-800-FDA-1088. Where should I keep my medication? This drug is given in a hospital or clinic and will not be stored at home. NOTE: This sheet is a summary. It may not cover all possible information. If you have questions about this medicine, talk to your doctor, pharmacist, orhealth care provider.  2022 Elsevier/Gold Standard (2016-07-02 20:21:10)

## 2020-11-23 NOTE — Progress Notes (Addendum)
[place] CSW Progress Notes  Per oncologist, patient needs transportation assistance. Spoke w patient, she is using [employer], trade union for appointments to/from [employer].  Melburn Popper is working fine for these appointments.  She is having difficulty with transportation to/from [facility].  She is frustrated that she is often late for appointments at the [facility] clinics but needs her motorized wheelchair to ambulate. She is also experiencing a long line at the valet at the main [place].  She is aware that there are challenges with both wheelchair transport and main campus valet/volunteer assistance. She wants to keep using the [facility] for her [place] appts and will continue to use wheelchair transport w her motorized wheelchair for her main [facility] campus appointments.  Edwyna Shell, LCSW Clinical Social Worker Phone:  [phone]

## 2020-11-24 ENCOUNTER — Telehealth: Payer: Self-pay | Admitting: Hematology

## 2020-11-24 NOTE — Telephone Encounter (Signed)
Scheduled follow-up appointments per 6/29 los. Patient is aware. 

## 2020-11-25 ENCOUNTER — Other Ambulatory Visit: Payer: Self-pay | Admitting: Internal Medicine

## 2020-11-29 ENCOUNTER — Encounter: Payer: Self-pay | Admitting: *Deleted

## 2020-12-02 ENCOUNTER — Encounter: Payer: Self-pay | Admitting: Hematology

## 2020-12-02 LAB — BMP8+ANION GAP
Anion Gap: 19 mmol/L — ABNORMAL HIGH (ref 10.0–18.0)
BUN/Creatinine Ratio: 20 (ref 12–28)
BUN: 24 mg/dL (ref 8–27)
CO2: 21 mmol/L (ref 20–29)
Calcium: 9.1 mg/dL (ref 8.7–10.3)
Chloride: 101 mmol/L (ref 96–106)
Creatinine, Ser: 1.18 mg/dL — ABNORMAL HIGH (ref 0.57–1.00)
Glucose: 89 mg/dL (ref 65–99)
Potassium: 3.7 mmol/L (ref 3.5–5.2)
Sodium: 141 mmol/L (ref 134–144)
eGFR: 51 mL/min/{1.73_m2} — ABNORMAL LOW (ref >59)

## 2020-12-02 LAB — LIPID PANEL
Chol/HDL Ratio: 2.9 ratio (ref 0.0–4.4)
Cholesterol, Total: 195 mg/dL (ref 100–199)
HDL: 68 mg/dL (ref >39)
LDL Chol Calc (NIH): 90 mg/dL (ref 0–99)
Triglycerides: 222 mg/dL — ABNORMAL HIGH (ref 0–149)
VLDL Cholesterol Cal: 37 mg/dL (ref 5–40)

## 2020-12-02 LAB — VITAMIN D 1,25 DIHYDROXY
Vitamin D 1, 25 (OH)2 Total: 38 pg/mL
Vitamin D2 1, 25 (OH)2: <10 pg/mL
Vitamin D3 1, 25 (OH)2: 38 pg/mL

## 2020-12-07 ENCOUNTER — Other Ambulatory Visit: Payer: Self-pay | Admitting: Nurse Practitioner

## 2020-12-07 ENCOUNTER — Ambulatory Visit (HOSPITAL_COMMUNITY)
Admission: RE | Admit: 2020-12-07 | Discharge: 2020-12-07 | Disposition: A | Discharge status: Home / Self Care | Payer: Medicare Other | Source: Ambulatory Visit | Attending: Nurse Practitioner | Admitting: Nurse Practitioner

## 2020-12-07 ENCOUNTER — Inpatient Hospital Stay: Payer: Medicare Other

## 2020-12-07 ENCOUNTER — Other Ambulatory Visit: Payer: Self-pay

## 2020-12-07 ENCOUNTER — Inpatient Hospital Stay: Payer: Medicare Other | Attending: Nurse Practitioner

## 2020-12-07 VITALS — BP 147/74 | HR 80 | Temp 98.3°F | Resp 17

## 2020-12-07 DIAGNOSIS — I10 Essential (primary) hypertension: Secondary | ICD-10-CM | POA: Insufficient documentation

## 2020-12-07 DIAGNOSIS — R809 Proteinuria, unspecified: Secondary | ICD-10-CM

## 2020-12-07 DIAGNOSIS — D500 Iron deficiency anemia secondary to blood loss (chronic): Secondary | ICD-10-CM | POA: Insufficient documentation

## 2020-12-07 DIAGNOSIS — Z888 Allergy status to other drugs, medicaments and biological substances status: Secondary | ICD-10-CM | POA: Diagnosis not present

## 2020-12-07 DIAGNOSIS — Z89612 Acquired absence of left leg above knee: Secondary | ICD-10-CM | POA: Insufficient documentation

## 2020-12-07 DIAGNOSIS — T8789 Other complications of amputation stump: Secondary | ICD-10-CM | POA: Diagnosis not present

## 2020-12-07 DIAGNOSIS — Z8719 Personal history of other diseases of the digestive system: Secondary | ICD-10-CM | POA: Diagnosis not present

## 2020-12-07 DIAGNOSIS — K922 Gastrointestinal hemorrhage, unspecified: Secondary | ICD-10-CM | POA: Diagnosis not present

## 2020-12-07 DIAGNOSIS — E119 Type 2 diabetes mellitus without complications: Secondary | ICD-10-CM | POA: Diagnosis not present

## 2020-12-07 DIAGNOSIS — Z880 Allergy status to penicillin: Secondary | ICD-10-CM | POA: Insufficient documentation

## 2020-12-07 DIAGNOSIS — K219 Gastro-esophageal reflux disease without esophagitis: Secondary | ICD-10-CM | POA: Insufficient documentation

## 2020-12-07 DIAGNOSIS — S6991XA Unspecified injury of right wrist, hand and finger(s), initial encounter: Secondary | ICD-10-CM | POA: Diagnosis not present

## 2020-12-07 DIAGNOSIS — Z881 Allergy status to other antibiotic agents status: Secondary | ICD-10-CM | POA: Diagnosis not present

## 2020-12-07 DIAGNOSIS — D469 Myelodysplastic syndrome, unspecified: Secondary | ICD-10-CM | POA: Insufficient documentation

## 2020-12-07 DIAGNOSIS — Z993 Dependence on wheelchair: Secondary | ICD-10-CM | POA: Diagnosis not present

## 2020-12-07 DIAGNOSIS — Z79899 Other long term (current) drug therapy: Secondary | ICD-10-CM | POA: Diagnosis not present

## 2020-12-07 DIAGNOSIS — M25512 Pain in left shoulder: Secondary | ICD-10-CM | POA: Insufficient documentation

## 2020-12-07 DIAGNOSIS — R519 Headache, unspecified: Secondary | ICD-10-CM | POA: Diagnosis not present

## 2020-12-07 DIAGNOSIS — Z885 Allergy status to narcotic agent status: Secondary | ICD-10-CM | POA: Diagnosis not present

## 2020-12-07 DIAGNOSIS — R600 Localized edema: Secondary | ICD-10-CM | POA: Diagnosis not present

## 2020-12-07 DIAGNOSIS — Z8673 Personal history of transient ischemic attack (TIA), and cerebral infarction without residual deficits: Secondary | ICD-10-CM | POA: Diagnosis not present

## 2020-12-07 DIAGNOSIS — B182 Chronic viral hepatitis C: Secondary | ICD-10-CM | POA: Insufficient documentation

## 2020-12-07 LAB — CBC WITH DIFFERENTIAL (CANCER CENTER ONLY)
Abs Immature Granulocytes: 0.06 10*3/uL (ref 0.00–0.07)
Basophils Absolute: 0 10*3/uL (ref 0.0–0.1)
Basophils Relative: 0 %
Eosinophils Absolute: 0.2 10*3/uL (ref 0.0–0.5)
Eosinophils Relative: 2 %
HCT: 35.6 % — ABNORMAL LOW (ref 36.0–46.0)
Hemoglobin: 12.2 g/dL (ref 12.0–15.0)
Immature Granulocytes: 1 %
Lymphocytes Relative: 11 %
Lymphs Abs: 1 10*3/uL (ref 0.7–4.0)
MCH: 33.2 pg (ref 26.0–34.0)
MCHC: 34.3 g/dL (ref 30.0–36.0)
MCV: 97 fL (ref 80.0–100.0)
Monocytes Absolute: 1.1 10*3/uL — ABNORMAL HIGH (ref 0.1–1.0)
Monocytes Relative: 12 %
Neutro Abs: 6.9 10*3/uL (ref 1.7–7.7)
Neutrophils Relative %: 74 %
Platelet Count: 248 10*3/uL (ref 150–400)
RBC: 3.67 MIL/uL — ABNORMAL LOW (ref 3.87–5.11)
RDW: 14.3 % (ref 11.5–15.5)
WBC Count: 9.2 10*3/uL (ref 4.0–10.5)
nRBC: 0 % (ref 0.0–0.2)

## 2020-12-07 LAB — URINALYSIS, COMPLETE (UACMP) WITH MICROSCOPIC
Bilirubin Urine: NEGATIVE
Glucose, UA: NEGATIVE mg/dL
Hgb urine dipstick: NEGATIVE
Ketones, ur: NEGATIVE mg/dL
Leukocytes,Ua: NEGATIVE
Nitrite: NEGATIVE
Protein, ur: >=300 mg/dL — AB
Specific Gravity, Urine: 1.017 (ref 1.005–1.030)
pH: 6 (ref 5.0–8.0)

## 2020-12-07 LAB — FERRITIN: Ferritin: 529 ng/mL — ABNORMAL HIGH (ref 11–307)

## 2020-12-07 LAB — TOTAL PROTEIN, URINE DIPSTICK: Protein, ur: >2000 mg/dL

## 2020-12-07 LAB — IRON AND TIBC
Iron: 62 ug/dL (ref 41–142)
Saturation Ratios: 25 % (ref 21–57)
TIBC: 251 ug/dL (ref 236–444)
UIBC: 190 ug/dL (ref 120–384)

## 2020-12-07 MED ORDER — SODIUM CHLORIDE 0.9 % IV SOLN: 5.0000 mg/kg | Freq: Once | INTRAVENOUS | Status: DC

## 2020-12-07 MED ORDER — DIPHENHYDRAMINE HCL 50 MG/ML IJ SOLN
50.0000 mg | Freq: Once | INTRAMUSCULAR | Status: AC
  Administered 2020-12-07: 50 mg via INTRAVENOUS

## 2020-12-07 MED ORDER — DIPHENHYDRAMINE HCL 50 MG/ML IJ SOLN
INTRAMUSCULAR | Status: AC
  Filled 2020-12-07: qty 1

## 2020-12-07 MED ORDER — SODIUM CHLORIDE 0.9 % IV SOLN
Freq: Once | INTRAVENOUS | Status: DC
  Filled 2020-12-07: qty 250

## 2020-12-07 MED ORDER — FAMOTIDINE 20 MG PO TABS
ORAL_TABLET | ORAL | Status: AC
  Filled 2020-12-07: qty 1

## 2020-12-07 MED ORDER — FERUMOXYTOL INJECTION 510 MG/17 ML
510.0000 mg | Freq: Once | INTRAVENOUS | Status: AC
Start: 2020-12-07 — End: 2020-12-07
  Administered 2020-12-07: 510 mg via INTRAVENOUS
  Filled 2020-12-07: qty 510

## 2020-12-07 MED ORDER — FAMOTIDINE 20 MG PO TABS
20.0000 mg | ORAL_TABLET | Freq: Once | ORAL | Status: AC
  Administered 2020-12-07: 20 mg via ORAL

## 2020-12-07 NOTE — Patient Instructions (Signed)

## 2020-12-07 NOTE — Progress Notes (Signed)
Patient seen in infusion room, reporting a minor fall and injury to right wrist. She has some pain and swelling, improved with wrist brace. Exam shows R wrist tenderness, mild edema, limited ROM and decreased hand strength. Will proceed with xray.   She also reports few days of urinary frequency, burning, and small amounts of blood. She is sexually active, but not in past week. She believes her partner is monogamous with her. She drinks water but not enough, and reports recent alcohol use. Urine dipstick showed >2000 proteinuria. Hold avastin. Will proceed with UA/culture. If negative for UTI, likely will do 24 hour urine collection.   Patient agrees with the plan.  Cira Rue, NP

## 2020-12-08 LAB — URINE CULTURE

## 2020-12-13 ENCOUNTER — Telehealth: Payer: Self-pay

## 2020-12-13 ENCOUNTER — Other Ambulatory Visit (HOSPITAL_COMMUNITY): Payer: Self-pay

## 2020-12-13 MED FILL — Dulaglutide Soln Auto-injector 0.75 MG/0.5ML: SUBCUTANEOUS | 84 days supply | Qty: 6 | Fill #1 | Status: AC

## 2020-12-13 NOTE — Telephone Encounter (Signed)
Patient has been made aware of x-ray results and verbalized understanding. Patient requested that I fax a copy of her x-ray to Jose Persia, MD. X-ray faxed with receipt of confirmation. Advised patient to follow up with Ortho, patient states that she will request Dr. Charleen Kirks place a referral to Ortho.

## 2020-12-13 NOTE — Telephone Encounter (Signed)
-----   Message from Rolland Bimler, RN sent at 12/08/2020  5:31 PM EDT ----- Contacted patient x 2 today - no answer on home # and mobile number did not connect.  ----- Message ----- From: Alla Feeling, NP Sent: 12/08/2020  11:52 AM EDT To: [facility]  Please call patient to let her know UA/culture is negative for UTI. There is some bacteria, but likely contaminated. I don't recommend repeat for now unless urinary symptoms persist. Encourage her to hydrate. Will call her with results of the wrist xray but on my interpretation there is no fracture.   Thanks, Regan Rakers, NP

## 2020-12-13 NOTE — Telephone Encounter (Signed)
-----   Message from Alla Feeling, NP sent at 12/13/2020  1:21 PM EDT ----- Please let her know right wrist is not fractured, but there is evidence of a torn ligament. I recommend she rest it, continue wearing brace, and f/up with ortho.  Thanks, Regan Rakers, NP

## 2020-12-13 NOTE — Telephone Encounter (Signed)
Patient was made aware of UA/Culture results today and verbalized understanding. Patient states that at the present time she does not have any urinary symptoms and she also states that she is drinking 32 oz of water daily. Advised patient that Cira Rue, NP will be in touch with her regarding the wrist x-ray.

## 2020-12-14 ENCOUNTER — Ambulatory Visit: Payer: Medicare Other | Admitting: Behavioral Health

## 2020-12-16 ENCOUNTER — Other Ambulatory Visit (HOSPITAL_COMMUNITY): Payer: Self-pay

## 2020-12-19 NOTE — Addendum Note (Signed)
Addended by: Jose Persia on: 12/19/2020 05:02 PM   Modules accepted: Orders

## 2020-12-20 NOTE — Progress Notes (Signed)
[place]   [phone] [phone]   Clinic Follow up Note   Patient Care Team: Jose Persia, MD as PCP - General Clent Jacks, MD as Consulting Physician (Ophthalmology) Coralie Keens, MD as Consulting Physician (General Surgery) Truitt Merle, MD as Consulting Physician (Hematology) Comer, Okey Regal, MD as Consulting Physician (Infectious Diseases) Loletha Carrow Kirke Corin, MD as Consulting Physician (Gastroenterology) 12/21/2020  CHIEF COMPLAINT: Follow-up anemia and h/o GI bleed  CURRENT THERAPY:  -IV Feraheme as needed -Blood transfusions as needed, last 08/05/20 -Bevacizumab (Zirabev) starting 08/10/20. Held periodically due to increased BP and proteinuria.   INTERVAL HISTORY: [name] returns for follow-up and treatment as scheduled.  She was last seen 11/23/2020 and received another cycle of Avastin, received Feraheme that day and again on 12/07/2020. She is doing well. Hands are stiff which is her clinical sign of anemia. Right wrist is still painful despite wearing brace. She has not seen ortho. Denies bleeding. She has gas "bubble" in her chest, on tums and omeprazole but not helping much. She eats spicy foods. Urinary symptoms resolved. Denies pain or any new concerns.   MEDICAL HISTORY:  Past Medical History:  Diagnosis Date   Allergic rhinitis    Anemia, iron deficiency 05/03/2011   2/2 GI AVMs - recieves iron infusions as well as periodic red blood cell transfusions   Anxiety    Arteriovenous malformation of duodenum 02/25/2015   CARPAL TUNNEL RELEASE, HX OF 07/22/2008   Chest pain 10/28/2019   Colon polyps    Diabetes mellitus without complication ([place])    type 2   Difficulty sleeping    takes trazadone for sleep   Diverticulosis    Fall 01/03/2020   GERD (gastroesophageal reflux disease)    Hepatitis C    in SVR with Harvoni 12/2014-02/2015.    History of hernia repair 08/18/2012   History of porphyria 11/30/2014   Porphyria  cutanea tarda in setting of chronic Hepatitis C, frequent  IV iron infusions, and alcohol abuse    Hypertension    Hypocalcemia 10/28/2019   Hypokalemia 09/27/2014   Peripheral vascular disease ([place])    Stroke ([place]) 2010   no residual deficits   Substance use disorder    cocaine   Tachycardia 07/10/2017   Trichomonas vaginitis 04/16/2019    SURGICAL HISTORY: Past Surgical History:  Procedure Laterality Date   ABDOMINAL HYSTERECTOMY     APPLICATION OF WOUND VAC N/A 06/21/2014   Procedure: APPLICATION OF WOUND VAC;  Surgeon: Coralie Keens, MD;  Location: [facility];  Service: General;  Laterality: N/A;   CARPAL TUNNEL RELEASE     rt hand   COLONOSCOPY WITH PROPOFOL N/A 03/17/2018   Procedure: COLONOSCOPY WITH PROPOFOL;  Surgeon: Doran Stabler, MD;  [facility];  Service: Gastroenterology;  Laterality: N/A;   ENTEROSCOPY N/A 12/04/2012   Procedure: ENTEROSCOPY;  Surgeon: Beryle Beams, MD;  [facility];  Service: Endoscopy;  Laterality: N/A;   ENTEROSCOPY N/A 12/18/2012   Procedure: ENTEROSCOPY;  Surgeon: Beryle Beams, MD;  [facility];  Service: Endoscopy;  Laterality: N/A;   ENTEROSCOPY N/A 02/25/2015   Procedure: ENTEROSCOPY;  Surgeon: Inda Castle, MD;  Location: [facility];  Service: Endoscopy;  Laterality: N/A;   ENTEROSCOPY N/A 10/29/2017   Procedure: ENTEROSCOPY;  Surgeon: Doran Stabler, MD;  [facility];  Service: Gastroenterology;  Laterality: N/A;   ENTEROSCOPY N/A 07/08/2020   Procedure: ENTEROSCOPY;  Surgeon: Milus Banister, MD;  [facility];  Service: Endoscopy;  Laterality: N/A;   ESOPHAGOGASTRODUODENOSCOPY  05/05/2011   Procedure: ESOPHAGOGASTRODUODENOSCOPY (EGD);  Surgeon: Zenovia Jarred, MD;  Location: [facility];  Service: Gastroenterology;  Laterality: N/A;  Dr. Hilarie Fredrickson will do procedure for Dr. Benson Norway Saturday.   ESOPHAGOGASTRODUODENOSCOPY  05/08/2011   Procedure: ESOPHAGOGASTRODUODENOSCOPY (EGD);  Surgeon:  Beryle Beams;  [facility];  Service: Endoscopy;  Laterality: N/A;   ESOPHAGOGASTRODUODENOSCOPY  06/07/2011   Procedure: ESOPHAGOGASTRODUODENOSCOPY (EGD);  Surgeon: Beryle Beams, MD;  Location: [facility];  Service: Endoscopy;  Laterality: N/A;   ESOPHAGOGASTRODUODENOSCOPY  12/20/2011   Procedure: ESOPHAGOGASTRODUODENOSCOPY (EGD);  Surgeon: Beryle Beams, MD;  Location: [facility];  Service: Endoscopy;  Laterality: N/A;   EYE SURGERY Bilateral    cataracts   FLEXIBLE SIGMOIDOSCOPY  12/21/2011   Procedure: FLEXIBLE SIGMOIDOSCOPY;  Surgeon: Beryle Beams, MD;  [facility];  Service: Endoscopy;  Laterality: N/A;   HOT HEMOSTASIS  06/07/2011   Procedure: HOT HEMOSTASIS (ARGON PLASMA COAGULATION/BICAP);  Surgeon: Beryle Beams, MD;  Location: [facility];  Service: Endoscopy;  Laterality: N/A;   HOT HEMOSTASIS N/A 07/08/2020   Procedure: HOT HEMOSTASIS (ARGON PLASMA COAGULATION/BICAP);  Surgeon: Milus Banister, MD;  Location: [facility];  Service: Endoscopy;  Laterality: N/A;   INCISIONAL HERNIA REPAIR N/A 06/01/2014   Procedure: ATTEMPTED LAPAROSCOPIC AND OPEN INCISIONAL HERNIA REPAIR WITH MESH;  Surgeon: Coralie Keens, MD;  Location: [facility];  Service: General;  Laterality: N/A;   INSERTION OF MESH N/A 06/01/2014   Procedure: INSERTION OF MESH;  Surgeon: Coralie Keens, MD;  Location: [facility];  Service: General;  Laterality: N/A;   LAPAROTOMY  02/16/2012   Procedure: EXPLORATORY LAPAROTOMY;  Surgeon: Edward Jolly, MD;  [facility];  Service: General;  Laterality: N/A;  oversewing of anastomotic leak and rigid sigmoidoscopy   LAPAROTOMY N/A 06/21/2014   Procedure: ABDOMINAL WOUND EXPLORATION;  Surgeon: Coralie Keens, MD;  Location: [facility];  Service: General;  Laterality: N/A;   LEG AMPUTATION ABOVE KNEE     left   PARTIAL COLECTOMY  02/15/2012   Procedure: PARTIAL COLECTOMY;  Surgeon: Harl Bowie, MD;  [facility];  Service: General;   Laterality: N/A;   [place] INJECTION  07/08/2020   Procedure: SUBMUCOSAL INJECTION;  Surgeon: Milus Banister, MD;  [facility];  Service: Endoscopy;;   TONSILLECTOMY      I have reviewed the social history and family history with the patient and they are unchanged from previous note.  ALLERGIES:  is allergic to ciprofloxacin, morphine and related, penicillins, codeine, lisinopril, and feraheme [ferumoxytol].  MEDICATIONS:  Current Outpatient Medications  Medication Sig Dispense Refill   ACCU-CHEK FASTCLIX LANCETS MISC Use to test blood glucose 3 times daily. 100 each 11   ACCU-CHEK GUIDE test strip USE TO TEST BLOOD SUGAR THREE TIMES A DAY 100 strip 3   amLODipine (NORVASC) 10 MG tablet Take 1 tablet (10 mg total) by mouth daily. 90 tablet 3   aspirin EC 81 MG tablet Take 1 tablet (81 mg total) by mouth daily. 30 tablet 1   Blood Glucose Monitoring Suppl (ACCU-CHEK GUIDE) w/Device KIT 1 each by Does not apply route 3 (three) times daily. The patient is insulin requiring, ICD 10 code E11.65. The patient tests 3 times a day. 1 kit 1   cetirizine (ZYRTEC) 10 MG tablet Take 1 tablet (10 mg total) by mouth daily. 30 tablet 0   Dulaglutide 0.75 MG/0.5ML SOPN INJECT 0.75 MG INTO THE  SKIN ONCE A WEEK. (Patient taking differently: Inject 0.75 mg into the skin once a week. Sunday) 6 mL 3   feeding supplement, GLUCERNA SHAKE, (GLUCERNA SHAKE) LIQD Take 237 mLs by mouth 3 (three) times daily between meals. (Patient taking differently: Take 237 mLs by mouth 2 (two) times daily between meals.) 90 Can 0   gabapentin (NEURONTIN) 400 MG capsule TAKE 1 CAPSULE BY MOUTH EVERY MORNING AND THEN TAKE 1 CAPSULE IN THE AFTERNOON; THEN 2 CAPSULES BY MOUTH BEFORE BEDTIME 120 capsule 1   losartan (COZAAR) 100 MG tablet TAKE 1 TABLET (100 MG TOTAL) BY MOUTH DAILY. (Patient taking differently: Take 100 mg by mouth daily.) 90 tablet 3   nicotine (NICODERM CQ - DOSED IN MG/24 HOURS) 14 mg/24hr patch Place 1  patch (14 mg total) onto the skin daily. 28 patch 0   omeprazole (PRILOSEC) 20 MG capsule TAKE 1 CAPSULE BY MOUTH DAILY 30 capsule 2   sertraline (ZOLOFT) 50 MG tablet Take 1 tablet (50 mg total) by mouth daily. 90 tablet 0   No current facility-administered medications for this visit.   Facility-Administered Medications Ordered in Other Visits  Medication Dose Route Frequency Provider Last Rate Last Admin   0.9 %  sodium chloride infusion   Intravenous Continuous Truitt Merle, MD 20 mL/hr at 12/21/20 1044 New Bag at 12/21/20 1044    PHYSICAL EXAMINATION:  Vitals:   12/21/20 0945  BP: (!) 153/82  Pulse: 79  Resp: 20  Temp: 98.1 F (36.7 C)  SpO2: 100%   Filed Weights   12/21/20 0945  Weight: 161 lb 9.6 oz (73.3 kg)    GENERAL:alert, no distress and comfortable SKIN: no rash  EYES: sclera clear LUNGS: normal breathing effort HEART:  no right lower extremity edema. Left amputation  NEURO: alert & oriented x 3 with fluent speech, no focal motor deficits  LABORATORY DATA:  I have reviewed the data as listed CBC Latest Ref Rng & Units 12/21/2020 12/07/2020 11/23/2020  WBC 4.0 - 10.5 K/uL 7.6 9.2 7.5  Hemoglobin 12.0 - 15.0 g/dL 13.3 12.2 10.4(L)  Hematocrit 36.0 - 46.0 % 37.4 35.6(L) 30.3(L)  Platelets 150 - 400 K/uL 258 248 301     CMP Latest Ref Rng & Units 11/17/2020 11/02/2020 10/19/2020  Glucose 65 - 99 mg/dL 89 132(H) 87  BUN 8 - 27 mg/dL 24 28(H) 19  Creatinine 0.57 - 1.00 mg/dL 1.18(H) 1.08(H) 1.01(H)  Sodium 134 - 144 mmol/L 141 141 140  Potassium 3.5 - 5.2 mmol/L 3.7 4.0 4.8  Chloride 96 - 106 mmol/L 101 106 100  CO2 20 - 29 mmol/L _0 Calcium 8.7 - 10.3 mg/dL 9.1 9.1 9.6  Total Protein 6.5 - 8.1 g/dL - 7.2 6.9  Total Bilirubin 0.3 - 1.2 mg/dL - 0.3 <0.2  Alkaline Phos 38 - 126 U/L - 69 73  AST 15 - 41 U/L - 22 18  ALT 0 - 44 U/L - 18 15      RADIOGRAPHIC STUDIES: I have personally reviewed the radiological images as listed and agreed with the findings  in the report. No results found.   ASSESSMENT & PLAN: [name] is a 70 y.o. female with     1. Iron deficiency anemia secondary to GI blood loss, and Anemia of Chronic Disease -She has a longstanding history of anemia dating back at least 2008 Hg was 10, then periodically normal in 2010 until she presented with nadir Hg 4.4 on 02/22/2010.  Her various  work-up showed normal reticulocyte in 04/2010, elevated B12/folate on 03/28/2012, normal haptoglobin on 01/02/2018, and normal SPEP on 12/16/2019 except elevated light chains with normal ratio.  - History of multiple GI bleeding in 2016, required blood transfusion, hospitalization and IV Feraheme -She had an EGD and colonoscopy on 10/29/2017 and 03/17/2018 with Dr. Loletha Carrow, which was normal. 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 -She has been on oral iron for 5-6 years now and has had intermittent black watery stool. -She has been receiving IV Feraheme every 1-2 month on average lately. Goal is to keep her Hg >10 and ferritin>100. Her last IV Feraheme was on 06/29/2020 -She developed a headache and increased her daily aspirin to 2 tabs 81 mg and presented with acute GI bleeding in 06/2020, hospitalized and received 1 blood transfusion. - Small bowel endoscopy Dr. Ardis Hughs on 07/09/19 showed non-bleeding AVM. This was cauterized.  CT was negative for occult malignancy. -08/03/20 no signs of recurrent GI bleeding, however Hgb remains low at 7.9 on 08/01/2020.  She received IV iron and 1u blood, Given that she is requiring frequent Iron and RBCs transfusions again (2 in last month, none since 2017), Dr. Burr Medico consented her to bevacizumab for GI bleeding which she started 08/10/20 -further work up 08/10/20 showed normal B12, folate (no nutritional cause) and normal LDH and haptoglobin (likely not hemolysis). Absolute retic was normal.  -Primary bone marrow disorder such as MDS has not been ruled out, we will hold on bone marrow biopsy for now   -She responded well, anemia resolved since starting Beva, held after 2 doses due to increasing BP and proteinuria.  We have continued Feraheme infusions -She had recurrent mild anemia Hg 10.4 on 11/23/2020, she received bevacizumab x1, held again due to proteinuria.  She received additional Feraheme   2. 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 Chronic hepatitis C infection -She tested positive for hepatitis C in 2009. She has previously completed treatment for hepatitis C in 2016 and repeated test was negative. -05/2018 liver US was unremarkable.  -She gets 2 flu shots a year (March and October) and she received her COVID booster in 02/2020.    4. DM2, Trouble sleeping  -Management per PCP's office.  -Her PCP started her on Valium 73m as needed to help her sleep. I recommend she not use this every night as it can lead to dependence.    5. Left AKA, has prosthesis, she is wheelchair bound most of time, Right LE Edema -LE edema could be related to nutritional deficiencies or decreased venous return. I encouraged her to elevate her right leg. -On 12/30/19 she had a mechanical fall. For pain she has been on Oxycodone q6hours. Her left shoulder pain has improved but her left stump pain mostly stable.  -She notes she has been using crutches and cane more lately and is working towards walking independently. 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  Scapholunate ligament tear s/p minor fall -Seen on plain film 12/07/2020.  No fracture -Currently wearing brace without improvement -Referred to Ortho for further management  7. GERD -Uncontrolled on Tums and omeprazole 20 mg once daily -Last serum creatinine 11/17/2020 1.18, avoiding increasing PPI -I recommend to limit spicy foods, drink water, and sit upright after eating for at least 30 minutes -Referred to GI for further management  Disposition: Ms. RGarganappears stable.  She continues IV Feraheme and  bevacizumab, last given 11/23/2020 currently on hold for proteinuria.  Anemia resolved since last dose.  She tolerates and responds well to the current treatment plan.  No signs of bleeding.  Labs reviewed, CBC is normal, iron in good range.  Bevacizumab remains on hold for urine protein 300 today.  Proceed with Feraheme as scheduled.  Return for lab, follow-up, IV iron, and resume Beva in 4 weeks if proteinuria improves.  For right ligament tear, she has not experienced pain relief with conservative management, I have referred her to ortho.    For GERD, uncontrolled on Tums and omeprazole 20 mg once daily, I referred her back to GI Dr. Loletha Carrow. Due to mild renal dysfunction I did not increase PPI today, will defer to GI.    Orders Placed This Encounter  Procedures   Ambulatory referral to Orthopedic Surgery    Referral Priority:   Routine    Referral Type:   Surgical    Referral Reason:   Specialty Services Required    Requested Specialty:   Orthopedic Surgery    Number of Visits Requested:   1   Ambulatory referral to Gastroenterology    Referral Priority:   Routine    Referral Type:   Consultation    Referral Reason:   Specialty Services Required    Referred to Provider:   Doran Stabler, MD    Number of Visits Requested:   1   All questions were answered. The patient knows to call the clinic with any problems, questions or concerns. No barriers to learning were detected.  Total encounter time was 30 minutes.     Patricia Feeling, NP 12/21/20

## 2020-12-21 ENCOUNTER — Inpatient Hospital Stay (HOSPITAL_BASED_OUTPATIENT_CLINIC_OR_DEPARTMENT_OTHER): Payer: Medicare Other | Admitting: Nurse Practitioner

## 2020-12-21 ENCOUNTER — Inpatient Hospital Stay: Payer: Medicare Other

## 2020-12-21 ENCOUNTER — Encounter: Payer: Self-pay | Admitting: Nurse Practitioner

## 2020-12-21 ENCOUNTER — Other Ambulatory Visit: Payer: Self-pay

## 2020-12-21 VITALS — BP 153/82 | HR 79 | Temp 98.1°F | Resp 20 | Ht 62.0 in | Wt 161.6 lb

## 2020-12-21 DIAGNOSIS — T148XXA Other injury of unspecified body region, initial encounter: Secondary | ICD-10-CM

## 2020-12-21 DIAGNOSIS — K922 Gastrointestinal hemorrhage, unspecified: Secondary | ICD-10-CM

## 2020-12-21 DIAGNOSIS — D500 Iron deficiency anemia secondary to blood loss (chronic): Secondary | ICD-10-CM

## 2020-12-21 DIAGNOSIS — K219 Gastro-esophageal reflux disease without esophagitis: Secondary | ICD-10-CM | POA: Diagnosis not present

## 2020-12-21 LAB — IRON AND TIBC
Iron: 120 ug/dL (ref 41–142)
Saturation Ratios: 51 % (ref 21–57)
TIBC: 236 ug/dL (ref 236–444)
UIBC: 117 ug/dL — ABNORMAL LOW (ref 120–384)

## 2020-12-21 LAB — CBC WITH DIFFERENTIAL (CANCER CENTER ONLY)
Abs Immature Granulocytes: 0.06 10*3/uL (ref 0.00–0.07)
Basophils Absolute: 0.1 10*3/uL (ref 0.0–0.1)
Basophils Relative: 1 %
Eosinophils Absolute: 0.1 10*3/uL (ref 0.0–0.5)
Eosinophils Relative: 2 %
HCT: 37.4 % (ref 36.0–46.0)
Hemoglobin: 13.3 g/dL (ref 12.0–15.0)
Immature Granulocytes: 1 %
Lymphocytes Relative: 14 %
Lymphs Abs: 1 10*3/uL (ref 0.7–4.0)
MCH: 34 pg (ref 26.0–34.0)
MCHC: 35.6 g/dL (ref 30.0–36.0)
MCV: 95.7 fL (ref 80.0–100.0)
Monocytes Absolute: 1 10*3/uL (ref 0.1–1.0)
Monocytes Relative: 13 %
Neutro Abs: 5.4 10*3/uL (ref 1.7–7.7)
Neutrophils Relative %: 69 %
Platelet Count: 258 10*3/uL (ref 150–400)
RBC: 3.91 MIL/uL (ref 3.87–5.11)
RDW: 13 % (ref 11.5–15.5)
WBC Count: 7.6 10*3/uL (ref 4.0–10.5)
nRBC: 0 % (ref 0.0–0.2)

## 2020-12-21 LAB — TOTAL PROTEIN, URINE DIPSTICK: Protein, ur: 300 mg/dL — AB

## 2020-12-21 MED ORDER — FAMOTIDINE 20 MG PO TABS
20.0000 mg | ORAL_TABLET | Freq: Once | ORAL | Status: AC
  Administered 2020-12-21: 20 mg via ORAL

## 2020-12-21 MED ORDER — DIPHENHYDRAMINE HCL 50 MG/ML IJ SOLN
50.0000 mg | Freq: Once | INTRAMUSCULAR | Status: AC
  Administered 2020-12-21: 50 mg via INTRAVENOUS

## 2020-12-21 MED ORDER — SODIUM CHLORIDE 0.9 % IV SOLN
INTRAVENOUS | Status: DC
  Filled 2020-12-21: qty 250

## 2020-12-21 MED ORDER — SODIUM CHLORIDE 0.9 % IV SOLN
510.0000 mg | Freq: Once | INTRAVENOUS | Status: AC
  Administered 2020-12-21: 510 mg via INTRAVENOUS
  Filled 2020-12-21: qty 510

## 2020-12-21 MED ORDER — FAMOTIDINE 20 MG PO TABS
ORAL_TABLET | ORAL | Status: AC
  Filled 2020-12-21: qty 1

## 2020-12-21 MED ORDER — DIPHENHYDRAMINE HCL 50 MG/ML IJ SOLN
INTRAMUSCULAR | Status: AC
  Filled 2020-12-21: qty 1

## 2020-12-21 NOTE — Patient Instructions (Signed)
Ferumoxytol injection What is this medication? FERUMOXYTOL is an iron complex. Iron is used to make healthy red blood cells, which carry oxygen and nutrients throughout the body. This medicine is used totreat iron deficiency anemia. This medicine may be used for other purposes; ask your health care provider orpharmacist if you have questions. COMMON BRAND NAME(S): Feraheme What should I tell my care team before I take this medication? They need to know if you have any of these conditions: anemia not caused by low iron levels high levels of iron in the blood magnetic resonance imaging (MRI) test scheduled an unusual or allergic reaction to iron, other medicines, foods, dyes, or preservatives pregnant or trying to get pregnant breast-feeding How should I use this medication? This medicine is for injection into a vein. It is given by a health careprofessional in a hospital or clinic setting. Talk to your pediatrician regarding the use of this medicine in children.Special care may be needed. Overdosage: If you think you have taken too much of this medicine contact apoison control center or emergency room at once. NOTE: This medicine is only for you. Do not share this medicine with others. What if I miss a dose? It is important not to miss your dose. Call your doctor or health careprofessional if you are unable to keep an appointment. What may interact with this medication? This medicine may interact with the following medications: other iron products This list may not describe all possible interactions. Give your health care provider a list of all the medicines, herbs, non-prescription drugs, or dietary supplements you use. Also tell them if you smoke, drink alcohol, or use illegaldrugs. Some items may interact with your medicine. What should I watch for while using this medication? Visit your doctor or healthcare professional regularly. Tell your doctor or healthcare professional if your  symptoms do not start to get better or if theyget worse. You may need blood work done while you are taking this medicine. You may need to follow a special diet. Talk to your doctor. Foods that contain iron include: whole grains/cereals, dried fruits, beans, or peas, leafy greenvegetables, and organ meats (liver, kidney). What side effects may I notice from receiving this medication? Side effects that you should report to your doctor or health care professionalas soon as possible: allergic reactions like skin rash, itching or hives, swelling of the face, lips, or tongue breathing problems changes in blood pressure feeling faint or lightheaded, falls fever or chills flushing, sweating, or hot feelings swelling of the ankles or feet Side effects that usually do not require medical attention (report to yourdoctor or health care professional if they continue or are bothersome): diarrhea headache nausea, vomiting stomach pain This list may not describe all possible side effects. Call your doctor for medical advice about side effects. You may report side effects to FDA at1-800-FDA-1088. Where should I keep my medication? This drug is given in a hospital or clinic and will not be stored at home. NOTE: This sheet is a summary. It may not cover all possible information. If you have questions about this medicine, talk to your doctor, pharmacist, orhealth care provider.  2022 Elsevier/Gold Standard (2016-07-02 20:21:10)  

## 2020-12-22 ENCOUNTER — Telehealth: Payer: Self-pay | Admitting: Hematology

## 2020-12-22 ENCOUNTER — Encounter: Payer: Self-pay | Admitting: Hematology

## 2020-12-22 NOTE — Telephone Encounter (Signed)
Scheduled follow-up appointment per 7/27 los. Patient is aware. 

## 2020-12-27 ENCOUNTER — Telehealth: Payer: Self-pay | Admitting: *Deleted

## 2020-12-27 NOTE — Telephone Encounter (Signed)
Seen, thank you.

## 2020-12-27 NOTE — Telephone Encounter (Signed)
Received faxed request for order, CMN, and OV notes addressing need for urinary incontinence supplies from [employer] [facility] [employer]. This has not been addressed since 02/08/20. Will route to [employer] for assistance scheduling patient to discuss need. Patient's last OV 6/23 with 4 week return request.

## 2020-12-28 ENCOUNTER — Encounter: Payer: Self-pay | Admitting: Hematology

## 2020-12-29 ENCOUNTER — Other Ambulatory Visit: Payer: Self-pay | Admitting: Internal Medicine

## 2020-12-29 DIAGNOSIS — M109 Gout, unspecified: Secondary | ICD-10-CM

## 2020-12-30 ENCOUNTER — Telehealth: Payer: Self-pay

## 2020-12-30 ENCOUNTER — Other Ambulatory Visit: Payer: Self-pay | Admitting: Nurse Practitioner

## 2020-12-30 DIAGNOSIS — T148XXA Other injury of unspecified body region, initial encounter: Secondary | ICD-10-CM

## 2020-12-30 NOTE — Telephone Encounter (Signed)
Per Cira Rue, NP, referral faxed to [facility]. Patient aware.

## 2021-01-11 ENCOUNTER — Ambulatory Visit (INDEPENDENT_AMBULATORY_CARE_PROVIDER_SITE_OTHER): Payer: Medicare Other | Admitting: Internal Medicine

## 2021-01-11 ENCOUNTER — Encounter: Payer: Self-pay | Admitting: Internal Medicine

## 2021-01-11 DIAGNOSIS — N393 Stress incontinence (female) (male): Secondary | ICD-10-CM

## 2021-01-11 NOTE — Assessment & Plan Note (Addendum)
This is a telephone encounter for stress incontinence supplies.  [name] reports she has been having this issue for over 10 years.  She is incontinent with laughing or coughing.  She does have multiple medical comorbidities, age 70, and is been pregnant 4 times with one child birth. She is very pleasant " but says honey I am old" and I just need my supplies. She is managing this incontinence well without other complications.  Incontinent order form completed and given to RN.

## 2021-01-11 NOTE — Progress Notes (Signed)
  [facility] Telephone Encounter Continuity Care Appointment  HPI:  This telephone encounter was created for [name] on 01/11/2021 for the following purpose/cc stress incontinence .   Past Medical History:  Past Medical History:  Diagnosis Date   Allergic rhinitis    Anemia, iron deficiency 05/03/2011   2/2 GI AVMs - recieves iron infusions as well as periodic red blood cell transfusions   Anxiety    Arteriovenous malformation of duodenum 02/25/2015   CARPAL TUNNEL RELEASE, HX OF 07/22/2008   Chest pain 10/28/2019   Colon polyps    Diabetes mellitus without complication ([place])    type 2   Difficulty sleeping    takes trazadone for sleep   Diverticulosis    Fall 01/03/2020   GERD (gastroesophageal reflux disease)    Hepatitis C    in SVR with Harvoni 12/2014-02/2015.    History of hernia repair 08/18/2012   History of porphyria 11/30/2014   Porphyria cutanea tarda in setting of chronic Hepatitis C, frequent  IV iron infusions, and alcohol abuse    Hypertension    Hypocalcemia 10/28/2019   Hypokalemia 09/27/2014   Peripheral vascular disease ([place])    Stroke ([place]) 2010   no residual deficits   Substance use disorder    cocaine   Tachycardia 07/10/2017   Trichomonas vaginitis 04/16/2019     ROS:  Review of Systems  Constitutional:  Negative for chills and fever.  Genitourinary:  Negative for dysuria (incontinent), frequency and urgency.     Assessment / Plan / Recommendations:  Please see A&P under problem oriented charting for assessment of the patient's acute and chronic medical conditions.  As always, pt is advised that if symptoms worsen or new symptoms arise, they should go to an urgent care facility or to to ER for further evaluation.   Consent and Medical Decision Making:  Patient discussed with Dr. Jimmye Norman This is a telephone encounter between [name] and Lorene Dy on 01/11/2021 for stress incontinence. The visit was  conducted with the patient located at home and Lorene Dy at [facility]. The patient's identity was confirmed using their DOB and current address. The patient has consented to being evaluated through a telephone encounter and understands the associated risks (an examination cannot be done and the patient may need to come in for an appointment) / benefits (allows the patient to remain at home, decreasing exposure to coronavirus). I personally spent 7 minutes on medical discussion.

## 2021-01-13 NOTE — Telephone Encounter (Signed)
Order, CMN, and OV notes from 8/17 addressing need for urinary incontinence supplies faxed to [facility] at [employer]. Fax confirmation receipt received.

## 2021-01-16 ENCOUNTER — Ambulatory Visit (INDEPENDENT_AMBULATORY_CARE_PROVIDER_SITE_OTHER): Payer: Medicare Other | Admitting: Internal Medicine

## 2021-01-16 ENCOUNTER — Encounter (HOSPITAL_COMMUNITY): Payer: Self-pay

## 2021-01-16 ENCOUNTER — Other Ambulatory Visit: Payer: Self-pay

## 2021-01-16 ENCOUNTER — Encounter: Payer: Self-pay | Admitting: Internal Medicine

## 2021-01-16 ENCOUNTER — Emergency Department (HOSPITAL_COMMUNITY): Payer: Medicare Other

## 2021-01-16 ENCOUNTER — Emergency Department (HOSPITAL_COMMUNITY)
Admission: EM | Admit: 2021-01-16 | Discharge: 2021-01-17 | Disposition: A | Discharge status: Home / Self Care | Payer: Medicare Other | Source: Home / Self Care | Attending: Emergency Medicine | Admitting: Emergency Medicine

## 2021-01-16 VITALS — BP 156/93 | HR 89 | Temp 98.2°F | Resp 20 | Ht 62.0 in | Wt 167.6 lb

## 2021-01-16 DIAGNOSIS — E1140 Type 2 diabetes mellitus with diabetic neuropathy, unspecified: Secondary | ICD-10-CM

## 2021-01-16 DIAGNOSIS — I10 Essential (primary) hypertension: Secondary | ICD-10-CM | POA: Insufficient documentation

## 2021-01-16 DIAGNOSIS — R791 Abnormal coagulation profile: Secondary | ICD-10-CM | POA: Insufficient documentation

## 2021-01-16 DIAGNOSIS — I700 Atherosclerosis of aorta: Secondary | ICD-10-CM | POA: Diagnosis not present

## 2021-01-16 DIAGNOSIS — R200 Anesthesia of skin: Secondary | ICD-10-CM | POA: Insufficient documentation

## 2021-01-16 DIAGNOSIS — E876 Hypokalemia: Secondary | ICD-10-CM | POA: Diagnosis not present

## 2021-01-16 DIAGNOSIS — F17210 Nicotine dependence, cigarettes, uncomplicated: Secondary | ICD-10-CM | POA: Insufficient documentation

## 2021-01-16 DIAGNOSIS — Z79899 Other long term (current) drug therapy: Secondary | ICD-10-CM | POA: Insufficient documentation

## 2021-01-16 DIAGNOSIS — E119 Type 2 diabetes mellitus without complications: Secondary | ICD-10-CM | POA: Insufficient documentation

## 2021-01-16 LAB — URINALYSIS, ROUTINE W REFLEX MICROSCOPIC
Bacteria, UA: NONE SEEN
Bilirubin Urine: NEGATIVE
Glucose, UA: 150 mg/dL — AB
Ketones, ur: NEGATIVE mg/dL
Leukocytes,Ua: NEGATIVE
Nitrite: NEGATIVE
Protein, ur: >=300 mg/dL — AB
Specific Gravity, Urine: 1.013 (ref 1.005–1.030)
pH: 6 (ref 5.0–8.0)

## 2021-01-16 LAB — DIFFERENTIAL
Abs Immature Granulocytes: 0.05 10*3/uL (ref 0.00–0.07)
Basophils Absolute: 0 10*3/uL (ref 0.0–0.1)
Basophils Relative: 0 %
Eosinophils Absolute: 0 10*3/uL (ref 0.0–0.5)
Eosinophils Relative: 0 %
Immature Granulocytes: 1 %
Lymphocytes Relative: 6 %
Lymphs Abs: 0.5 10*3/uL — ABNORMAL LOW (ref 0.7–4.0)
Monocytes Absolute: 0.1 10*3/uL (ref 0.1–1.0)
Monocytes Relative: 2 %
Neutro Abs: 7.6 10*3/uL (ref 1.7–7.7)
Neutrophils Relative %: 91 %

## 2021-01-16 LAB — COMPREHENSIVE METABOLIC PANEL
ALT: 12 U/L (ref 0–44)
AST: 23 U/L (ref 15–41)
Albumin: 3.2 g/dL — ABNORMAL LOW (ref 3.5–5.0)
Alkaline Phosphatase: 67 U/L (ref 38–126)
Anion gap: 13 (ref 5–15)
BUN: 13 mg/dL (ref 8–23)
CO2: 27 mmol/L (ref 22–32)
Calcium: 5.8 mg/dL — CL (ref 8.9–10.3)
Chloride: 101 mmol/L (ref 98–111)
Creatinine, Ser: 1.01 mg/dL — ABNORMAL HIGH (ref 0.44–1.00)
GFR, Estimated: >60 mL/min (ref >60)
Glucose, Bld: 139 mg/dL — ABNORMAL HIGH (ref 70–99)
Potassium: 3.6 mmol/L (ref 3.5–5.1)
Sodium: 141 mmol/L (ref 135–145)
Total Bilirubin: 0.5 mg/dL (ref 0.3–1.2)
Total Protein: 6.7 g/dL (ref 6.5–8.1)

## 2021-01-16 LAB — CBC
HCT: 45 % (ref 36.0–46.0)
Hemoglobin: 15.2 g/dL — ABNORMAL HIGH (ref 12.0–15.0)
MCH: 32.8 pg (ref 26.0–34.0)
MCHC: 33.8 g/dL (ref 30.0–36.0)
MCV: 97.2 fL (ref 80.0–100.0)
Platelets: 293 10*3/uL (ref 150–400)
RBC: 4.63 MIL/uL (ref 3.87–5.11)
RDW: 12.3 % (ref 11.5–15.5)
WBC: 8.3 10*3/uL (ref 4.0–10.5)
nRBC: 0 % (ref 0.0–0.2)

## 2021-01-16 LAB — RAPID URINE DRUG SCREEN, HOSP PERFORMED
Amphetamines: NOT DETECTED
Barbiturates: NOT DETECTED
Benzodiazepines: NOT DETECTED
Cocaine: POSITIVE — AB
Opiates: NOT DETECTED
Tetrahydrocannabinol: POSITIVE — AB

## 2021-01-16 LAB — CBG MONITORING, ED: Glucose-Capillary: 218 mg/dL — ABNORMAL HIGH (ref 70–99)

## 2021-01-16 LAB — I-STAT CHEM 8, ED
BUN: 16 mg/dL (ref 8–23)
Calcium, Ion: 0.62 mmol/L — CL (ref 1.15–1.40)
Chloride: 101 mmol/L (ref 98–111)
Creatinine, Ser: 0.9 mg/dL (ref 0.44–1.00)
Glucose, Bld: 140 mg/dL — ABNORMAL HIGH (ref 70–99)
HCT: 47 % — ABNORMAL HIGH (ref 36.0–46.0)
Hemoglobin: 16 g/dL — ABNORMAL HIGH (ref 12.0–15.0)
Potassium: 3.7 mmol/L (ref 3.5–5.1)
Sodium: 141 mmol/L (ref 135–145)
TCO2: 30 mmol/L (ref 22–32)

## 2021-01-16 LAB — PROTIME-INR
INR: 1 (ref 0.8–1.2)
Prothrombin Time: 13.4 seconds (ref 11.4–15.2)

## 2021-01-16 LAB — ETHANOL: Alcohol, Ethyl (B): <10 mg/dL (ref <10)

## 2021-01-16 LAB — APTT: aPTT: 33 seconds (ref 24–36)

## 2021-01-16 NOTE — Assessment & Plan Note (Signed)
Last HbA1c in May 2022 was 5.6%. Patient currently on dulaglutide 0.75 mg once weekly.

## 2021-01-16 NOTE — Assessment & Plan Note (Signed)
>>  ASSESSMENT AND PLAN FOR TYPE 2 DIABETES MELLITUS WITH DIABETIC NEUROPATHY (HCC) WRITTEN ON 01/16/2021  3:20 PM BY DEAN, EMILY, DO  Last HbA1c in May 2022 was 5.6%. Patient currently on dulaglutide  0.75 mg once weekly.

## 2021-01-16 NOTE — Assessment & Plan Note (Signed)
Patient hypertensive in office at 156/93. She is prescribed amlodipine 10 mg daily, losartan 10 mg daily. Would recommend discussion regarding increasing antihypertensive agents at next visit.

## 2021-01-16 NOTE — ED Notes (Signed)
Calcium 5.8 reported from lab. Phylliss Bob PA notified

## 2021-01-16 NOTE — ED Notes (Signed)
Pt told [facility] assistant that she wanted her sugar checked. Sugar and vitals obtained. Pt states she just wanted her sugar checked to make sure it wasn't too low.

## 2021-01-16 NOTE — Progress Notes (Signed)
Patient was transported via wheelchair to ER.  Slurred speech.  C/O of feeling tired.  Facial numbness on both sides of face. Sander Nephew, RN 01/16/2021 3:09 PM.

## 2021-01-16 NOTE — Progress Notes (Signed)
CC: face numbness  HPI:  [name] is a 69 y.o. female with past medical history as detailed below who presents to clinic complaining of alternating facial numbness, R sided headache, drooling from the R side, speech changes, blurring of vision, numbness and tingling of bilateral arms and fingers, and extreme fatigue for 4 days. Please see problem based charting for detailed assessment and plan.  Past Medical History:  Diagnosis Date   Allergic rhinitis    Anemia, iron deficiency 05/03/2011   2/2 GI AVMs - recieves iron infusions as well as periodic red blood cell transfusions   Anxiety    Arteriovenous malformation of duodenum 02/25/2015   CARPAL TUNNEL RELEASE, HX OF 07/22/2008   Chest pain 10/28/2019   Colon polyps    Diabetes mellitus without complication ([place])    type 2   Difficulty sleeping    takes trazadone for sleep   Diverticulosis    Fall 01/03/2020   GERD (gastroesophageal reflux disease)    Hepatitis C    in SVR with Harvoni 12/2014-02/2015.    History of hernia repair 08/18/2012   History of porphyria 11/30/2014   Porphyria cutanea tarda in setting of chronic Hepatitis C, frequent  IV iron infusions, and alcohol abuse    Hypertension    Hypocalcemia 10/28/2019   Hypokalemia 09/27/2014   Peripheral vascular disease ([place])    Stroke ([place]) 2010   no residual deficits   Substance use disorder    cocaine   Tachycardia 07/10/2017   Trichomonas vaginitis 04/16/2019   Review of Systems:  Review of Systems  Constitutional:  Positive for malaise/fatigue.  Eyes:  Positive for blurred vision. Negative for pain.  Respiratory:  Negative for shortness of breath and wheezing.   Cardiovascular:  Negative for chest pain and palpitations.  Gastrointestinal:  Positive for constipation. Negative for abdominal pain.  Neurological:  Positive for tingling (in arms and fingers), sensory change, speech change, weakness and headaches.    Physical Exam:  Vitals:   01/16/21 1345   BP: (!) 156/93  Pulse: 89  Resp: 20  Temp: 98.2 F (36.8 C)  TempSrc: Oral  SpO2: 100%  Weight: 167 lb 9.6 oz (76 kg)  Height: '5\' 2"'$  (1.575 m)   Physical Exam Constitutional:      Comments: Patient wrapped in blanket with complaint of being very cold. She is sleeping slumped to the R side seated in a wheelchair. She wakes to voice but keeps her eyes closed during conversation, opening after multiple requests. She moves very slowly and takes longer than appropriate to respond in conversation.   HENT:     Head:     Jaw: There is normal jaw occlusion.     Mouth/Throat:     Tongue: Tongue does not deviate from midline.     Comments: There is involuntary twitching of entire face. Cardiovascular:     Rate and Rhythm: Normal rate and regular rhythm.     Pulses:          Dorsalis pedis pulses are 2+ on the right side and 2+ on the left side.       Posterior tibial pulses are 2+ on the right side and 2+ on the left side.     Heart sounds: Normal heart sounds.     Comments: Pain noted when palpating edema of bilateral LE. Pulmonary:     Effort: Pulmonary effort is normal.     Breath sounds: Normal air entry. Examination of the right-upper field  reveals wheezing. Examination of the left-upper field reveals wheezing. Wheezing present.  Musculoskeletal:     Right lower leg: 2+ Pitting Edema present.     Left lower leg: 2+ Pitting Edema present.     Left Lower Extremity: Left leg is amputated above knee.  Skin:    General: Skin is warm and dry.  Neurological:     Mental Status: She is lethargic.     Cranial Nerves: Facial asymmetry (there is a droop on the R side of her mouth) present.     Comments: Strength 3/5 bilateral UE. Sensation decreased R face compared to L. Otherwise difficult to assess neurological function due to lethargic state of patient.  Psychiatric:        Speech: Speech is delayed.        Behavior: Behavior is agitated.     Assessment & Plan:   See Encounters  Tab for problem based charting.  Patient seen with Dr. Dareen Piano

## 2021-01-16 NOTE — ED Notes (Signed)
Attempted to get pt to sign MSE waiver and tried to get EKG, pt refused care because this tech didn't clean out the cut on her finger before giving her a bandaid that she asked for.

## 2021-01-16 NOTE — ED Notes (Signed)
Pt stated she was diabetic this NT offered to check sugar pt refused.

## 2021-01-16 NOTE — Assessment & Plan Note (Signed)
Patient reports 4 days of facial numbness that alternates in laterality, R sided headache over temporal area, new drooling from the R side of her mouth, speech changes, blurring of vision, numbness and tingling of bilateral arms and fingers, and extreme fatigue for 4 days. She does not endorse a history of stroke but has multiple comorbidities including hypertension, diabetes, known atherosclerotic disease, smoking. She says that her symptoms are comparable to when she is due for infusion for iron deficiency anemia and is scheduled for transfusion on Friday 01/20/2021. She also says that these symptoms are comparable to her most recent hospitalization in May 2022 where she was found to have multiple electrolyte abnormalities.  While her symptoms could be contributed to recurrence of either of these previous concerns, I am concerned that she has had or is having a stroke. When speaking with the patient regarding our recommendation that she report to the ED for more thorough evaluation she very firmly declined as she stated she knows her brain and that she is fine, and that she preferred to go home and go to bed due to feelings of extreme fatigue. She was counseled on the risk of going home without further evaluation and that there is a large concern that her presentation could be due to stroke.   She did ultimately agree to report to ED for further evaluation under the premise that she would be seen quickly. We cannot promise that she will not have to wait to be seen and informed her of this. As of this note, she is in the ED.

## 2021-01-16 NOTE — Assessment & Plan Note (Addendum)
Patient is not currently on a statin medication. ASCVD based on most recent lipid panel in June 2022 is 55.5% carrying the recommendation for patient to be on high-intensity statin therapy. Chart review shows that she has not been on a statin in the past and that there have been concerns before because of her history of hepatitis C on medication and abnormal LFTs. Would recommend initiating treatment at next visit if no contraindication at next visit.

## 2021-01-16 NOTE — ED Triage Notes (Signed)
Patient complains of bilateral arm weakness, facial numbness and swelling. States that she was sent from internal medicine clinic for evaluation of her weakness.  Alert and oriented,

## 2021-01-16 NOTE — ED Provider Notes (Signed)
Emergency Medicine Provider Triage Evaluation Note  [name] , a 70 y.o. female  was evaluated in triage.  Pt complains of being sent by clinic for further evaluation.  She complains of weakness in her bilateral arms, facial tenderness and swelling and drooling.    Patient confirms that these are her symptoms, refuses to answer additional questions.   Review of Systems  Positive: Facial numbness Negative: fever  Physical Exam  BP (!) 144/86 (BP Location: Right Arm)   Pulse 89   Temp 98.6 F (37 C) (Oral)   Resp 18   SpO2 97%  Gen:   Awake, uncooperative and argumentative.   Resp:  Normal effort  MSK:   Moves Bilateral arms and right leg without obvious difficulty.  Status post left leg amputation.  Right lower extremity edema.  Limited exam as patient refuses to cooperate and additional testing Other:  When asked patient to move her hand away from her face alert I can evaluate her for facial droop she states "why do you need to do that my face is numb not drooping."    When I asked her to smile "there is no need for me to smile"  when I attempted to further explain she refused.  When I attempt to test sensation she refuses stating "no one is going to touch me."  Medical Decision Making  Medically screening exam initiated at 3:44 PM.  Appropriate orders placed.  [name] was informed that the remainder of the evaluation will be completed by another provider, this initial triage assessment does not replace that evaluation, and the importance of remaining in the ED until their evaluation is complete.  Orders placed.  Exam limited by patient.   Note: Portions of this report may have been transcribed using voice recognition software. Every effort was made to ensure accuracy; however, inadvertent computerized transcription errors may be present    Lorin Glass, PA-C 01/16/21 1551    Tegeler, Gwenyth Allegra, MD 01/16/21 [phone]

## 2021-01-17 ENCOUNTER — Other Ambulatory Visit: Payer: Self-pay

## 2021-01-17 ENCOUNTER — Other Ambulatory Visit: Payer: Self-pay | Admitting: Hematology

## 2021-01-17 ENCOUNTER — Emergency Department (HOSPITAL_COMMUNITY): Payer: Medicare Other

## 2021-01-17 ENCOUNTER — Other Ambulatory Visit (HOSPITAL_COMMUNITY): Payer: Self-pay

## 2021-01-17 MED ORDER — CALCIUM CARBONATE-VITAMIN D 500-200 MG-UNIT PO TABS
1.0000 | ORAL_TABLET | Freq: Two times a day (BID) | ORAL | 0 refills | Status: DC
  Filled 2021-01-17: qty 14, 7d supply, fill #0

## 2021-01-17 MED ORDER — CALCIUM GLUCONATE-NACL 1-0.675 GM/50ML-% IV SOLN
1.0000 g | Freq: Once | INTRAVENOUS | Status: AC
  Administered 2021-01-17: 1000 mg via INTRAVENOUS
  Filled 2021-01-17: qty 50

## 2021-01-17 MED ORDER — SODIUM CHLORIDE 0.9 % IV BOLUS
500.0000 mL | Freq: Once | INTRAVENOUS | Status: AC
  Administered 2021-01-17: 500 mL via INTRAVENOUS

## 2021-01-17 NOTE — Discharge Instructions (Addendum)
You were evaluated in the Emergency Department and after careful evaluation, we did not find any emergent condition requiring admission or further testing in the hospital.  Your exam/testing today is overall reassuring.  Symptoms seem to be due to low calcium levels.  MRI did not show any evidence of stroke.  Recommend close follow-up with your regular doctors.  Please take the calcium vitamin D medication as prescribed.  Please return to the Emergency Department if you experience any worsening of your condition.   Thank you for allowing [place] to be a part of your care.

## 2021-01-17 NOTE — ED Notes (Signed)
Patient denies pain and is resting comfortably.  

## 2021-01-17 NOTE — ED Notes (Signed)
Provider at bedside

## 2021-01-17 NOTE — Progress Notes (Signed)
..  Patient is receiving Assistance Medication - Supplied Externally. Medication: Zirabev (bevacizumab -bvzr) Manufacture: [employer] Approval Dates: Approved from 01/17/2021 until 05/27/2021. ID: [id number] Reason: Off Label Use First DOS: 08//25/2022.

## 2021-01-17 NOTE — ED Provider Notes (Signed)
[facility] Emergency Department Provider Note MRN:  [id number]  Arrival date & time: 01/17/21     Chief Complaint   Numbness History of Present Illness   [name] is a 70 y.o. year-old female with a history of diabetes, stroke presenting to the ED with chief complaint of numbness.  Patient began having paresthesia to the right arm as well as the right side of the lips about 5 days ago.  Since then the symptoms have progressed to the lips on both sides in both upper extremities.  Denies any pain, no fever, no cough, no dysuria, no hematuria no outright numbness or weakness to the arms or legs.  Has not been eating much lately, no appetite.  Review of Systems  A complete 10 system review of systems was obtained and all systems are negative except as noted in the HPI and PMH.   Patient's Health History    Past Medical History:  Diagnosis Date   Allergic rhinitis    Anemia, iron deficiency 05/03/2011   2/2 GI AVMs - recieves iron infusions as well as periodic red blood cell transfusions   Anxiety    Arteriovenous malformation of duodenum 02/25/2015   CARPAL TUNNEL RELEASE, HX OF 07/22/2008   Chest pain 10/28/2019   Colon polyps    Diabetes mellitus without complication ([place])    type 2   Difficulty sleeping    takes trazadone for sleep   Diverticulosis    Fall 01/03/2020   GERD (gastroesophageal reflux disease)    Hepatitis C    in SVR with Harvoni 12/2014-02/2015.    History of hernia repair 08/18/2012   History of porphyria 11/30/2014   Porphyria cutanea tarda in setting of chronic Hepatitis C, frequent  IV iron infusions, and alcohol abuse    Hypertension    Hypocalcemia 10/28/2019   Hypokalemia 09/27/2014   Peripheral vascular disease ([place])    Stroke ([place]) 2010   no residual deficits   Substance use disorder    cocaine   Tachycardia 07/10/2017   Trichomonas vaginitis 04/16/2019    Past Surgical History:  Procedure Laterality Date   ABDOMINAL  HYSTERECTOMY     APPLICATION OF WOUND VAC N/A 06/21/2014   Procedure: APPLICATION OF WOUND VAC;  Surgeon: Coralie Keens, MD;  Location: [place];  Service: General;  Laterality: N/A;   CARPAL TUNNEL RELEASE     rt hand   COLONOSCOPY WITH PROPOFOL N/A 03/17/2018   Procedure: COLONOSCOPY WITH PROPOFOL;  Surgeon: Doran Stabler, MD;  Location: [facility];  Service: Gastroenterology;  Laterality: N/A;   ENTEROSCOPY N/A 12/04/2012   Procedure: ENTEROSCOPY;  Surgeon: Beryle Beams, MD;  Location: [facility];  Service: Endoscopy;  Laterality: N/A;   ENTEROSCOPY N/A 12/18/2012   Procedure: ENTEROSCOPY;  Surgeon: Beryle Beams, MD;  Location[facility];  Service: Endoscopy;  Laterality: N/A;   ENTEROSCOPY N/A 02/25/2015   Procedure: ENTEROSCOPY;  Surgeon: Inda Castle, MD;  Location: [facility];  Service: Endoscopy;  Laterality: N/A;   ENTEROSCOPY N/A 10/29/2017   Procedure: ENTEROSCOPY;  Surgeon: Doran Stabler, MD;  Location: [facility];  Service: Gastroenterology;  Laterality: N/A;   ENTEROSCOPY N/A 07/08/2020   Procedure: ENTEROSCOPY;  Surgeon: Milus Banister, MD;  [facility];  Service: Endoscopy;  Laterality: N/A;   ESOPHAGOGASTRODUODENOSCOPY  05/05/2011   Procedure: ESOPHAGOGASTRODUODENOSCOPY (EGD);  Surgeon: Zenovia Jarred, MD;  Location: [facility];  Service: Gastroenterology;  Laterality: N/A;  Dr. Hilarie Fredrickson will do procedure  for Dr. Benson Norway Saturday.   ESOPHAGOGASTRODUODENOSCOPY  05/08/2011   Procedure: ESOPHAGOGASTRODUODENOSCOPY (EGD);  Surgeon: Beryle Beams;  [facility];  Service: Endoscopy;  Laterality: N/A;   ESOPHAGOGASTRODUODENOSCOPY  06/07/2011   Procedure: ESOPHAGOGASTRODUODENOSCOPY (EGD);  Surgeon: Beryle Beams, MD;  Location: [facility];  Service: Endoscopy;  Laterality: N/A;   ESOPHAGOGASTRODUODENOSCOPY  12/20/2011   Procedure: ESOPHAGOGASTRODUODENOSCOPY (EGD);  Surgeon: Beryle Beams, MD;  Location: [facility];  Service: Endoscopy;   Laterality: N/A;   EYE SURGERY Bilateral    cataracts   FLEXIBLE SIGMOIDOSCOPY  12/21/2011   Procedure: FLEXIBLE SIGMOIDOSCOPY;  Surgeon: Beryle Beams, MD;  [facility];  Service: Endoscopy;  Laterality: N/A;   HOT HEMOSTASIS  06/07/2011   Procedure: HOT HEMOSTASIS (ARGON PLASMA COAGULATION/BICAP);  Surgeon: Beryle Beams, MD;  Location: [facility];  Service: Endoscopy;  Laterality: N/A;   HOT HEMOSTASIS N/A 07/08/2020   Procedure: HOT HEMOSTASIS (ARGON PLASMA COAGULATION/BICAP);  Surgeon: Milus Banister, MD;  Location: [facility];  Service: Endoscopy;  Laterality: N/A;   INCISIONAL HERNIA REPAIR N/A 06/01/2014   Procedure: ATTEMPTED LAPAROSCOPIC AND OPEN INCISIONAL HERNIA REPAIR WITH MESH;  Surgeon: Coralie Keens, MD;  Location: [place];  Service: General;  Laterality: N/A;   INSERTION OF MESH N/A 06/01/2014   Procedure: INSERTION OF MESH;  Surgeon: Coralie Keens, MD;  Location: [place];  Service: General;  Laterality: N/A;   LAPAROTOMY  02/16/2012   Procedure: EXPLORATORY LAPAROTOMY;  Surgeon: Edward Jolly, MD;  [facility];  Service: General;  Laterality: N/A;  oversewing of anastomotic leak and rigid sigmoidoscopy   LAPAROTOMY N/A 06/21/2014   Procedure: ABDOMINAL WOUND EXPLORATION;  Surgeon: Coralie Keens, MD;  Location: [place];  Service: General;  Laterality: N/A;   LEG AMPUTATION ABOVE KNEE     left   PARTIAL COLECTOMY  02/15/2012   Procedure: PARTIAL COLECTOMY;  Surgeon: Harl Bowie, MD;  Location: [facility];  Service: General;  Laterality: N/A;   SUBMUCOSAL INJECTION  07/08/2020   Procedure: SUBMUCOSAL INJECTION;  Surgeon: Milus Banister, MD;  [facility];  Service: Endoscopy;;   TONSILLECTOMY      Family History  Problem Relation Age of Onset   Cancer Father        unknown   Hypertension Mother    Diverticulitis Mother     Social History   Socioeconomic History   Marital status: Widowed    Spouse name: Not on file   Number of  children: 3   Years of education: Not on file   Highest education level: Not on file  Occupational History   Occupation: Retired  Tobacco Use   Smoking status: Some Days    Packs/day: 0.50    Years: 17.00    Pack years: 8.50    Types: Cigarettes   Smokeless tobacco: Never   Tobacco comments:    1 pack last 1 month  Vaping Use   Vaping Use: Never used  Substance and Sexual Activity   Alcohol use: Yes    Alcohol/week: 0.0 standard drinks   Drug use: Yes    Types: Cocaine, Marijuana    Comment: Sometimes.   Sexual activity: Yes    Partners: Male    Birth control/protection: Post-menopausal  Other Topics Concern   Not on file  Social History Narrative   Not on file   Social Determinants of Health   Financial Resource Strain: Not on file  Food Insecurity: Not on file  Transportation Needs: Unmet Transportation  Needs   Lack of Transportation (Medical): Yes   Lack of Transportation (Non-Medical): Yes  Physical Activity: Not on file  Stress: Not on file  Social Connections: Not on file  Intimate Partner Violence: Not on file     Physical Exam   Vitals:   01/17/21 0215 01/17/21 0330  BP: (!) 169/103 139/89  Pulse: 86 92  Resp: 14 15  Temp:    SpO2: 92% 98%    CONSTITUTIONAL: Well-appearing, NAD NEURO:  Alert and oriented x 3, normal and symmetric strength and sensation, normal coordination, normal speech EYES:  eyes equal and reactive ENT/NECK:  no LAD, no JVD CARDIO: Regular rate, well-perfused, normal S1 and S2 PULM:  CTAB no wheezing or rhonchi GI/GU:  normal bowel sounds, non-distended, non-tender MSK/SPINE: Left prosthetic leg SKIN:  no rash, atraumatic PSYCH:  Appropriate speech and behavior  *Additional and/or pertinent findings included in MDM below  Diagnostic and Interventional Summary    EKG Interpretation  Date/Time:  January 17, 2021 at 4: 14: 59 Ventricular Rate: 91   PR Interval: 204   QRS Duration: 89 QT Interval:  408  QTC  Calculation:  502 R Axis:     Text Interpretation: Sinus rhythm, prolonged QT interval, improving Confirmed Dr. Gerlene Fee at 4:18 AM       Labs Reviewed  CBC - Abnormal; Notable for the following components:      Result Value   Hemoglobin 15.2 (*)    All other components within normal limits  DIFFERENTIAL - Abnormal; Notable for the following components:   Lymphs Abs 0.5 (*)    All other components within normal limits  COMPREHENSIVE METABOLIC PANEL - Abnormal; Notable for the following components:   Glucose, Bld 139 (*)    Creatinine, Ser 1.01 (*)    Calcium 5.8 (*)    Albumin 3.2 (*)    All other components within normal limits  RAPID URINE DRUG SCREEN, HOSP PERFORMED - Abnormal; Notable for the following components:   Cocaine POSITIVE (*)    Tetrahydrocannabinol POSITIVE (*)    All other components within normal limits  URINALYSIS, ROUTINE W REFLEX MICROSCOPIC - Abnormal; Notable for the following components:   APPearance HAZY (*)    Glucose, UA 150 (*)    Hgb urine dipstick SMALL (*)    Protein, ur >=300 (*)    All other components within normal limits  I-STAT CHEM 8, ED - Abnormal; Notable for the following components:   Glucose, Bld 140 (*)    Calcium, Ion 0.62 (*)    Hemoglobin 16.0 (*)    HCT 47.0 (*)    All other components within normal limits  CBG MONITORING, ED - Abnormal; Notable for the following components:   Glucose-Capillary 218 (*)    All other components within normal limits  ETHANOL  PROTIME-INR  APTT    MR BRAIN WO CONTRAST  Final Result    CT HEAD WO CONTRAST  Final Result      Medications  calcium gluconate 1 g/ 50 mL sodium chloride IVPB (0 g Intravenous Stopped 01/17/21 0129)  sodium chloride 0.9 % bolus 500 mL (0 mLs Intravenous Stopped 01/17/21 0129)     Procedures  /  Critical Care Procedures  ED Course and Medical Decision Making  I have reviewed the triage vital signs, the nursing notes, and pertinent available records from  the EMR.  Listed above are laboratory and imaging tests that I personally ordered, reviewed, and interpreted and then considered in my  medical decision making (see below for details).  Paresthesia to the arms and lip area, sent here for stroke evaluation however labs reveal significant hypocalcemia.  Electrolyte disturbance could explain patient's symptoms.  CT head that is without signs of acute stroke.  She has no other stroke symptoms on exam.  QT has some prolongation which would coincide with hypocalcemia.  Providing IV calcium and will reassess.     Patient with continued symptoms despite calcium.  Given this and the initial unilateral nature of her symptoms, MRI obtained to exclude stroke.  MRI is reassuring.  On repeated assessment patient is feeling better, resolving paresthesias to the lips.  QT interval is improving on reassessment the EKG.  Discussed management options, patient prefers outpatient management, providing calcium vitamin D supplementation for the next week, advised close outpatient follow-up.  Strict return precautions.  Appropriate for discharge.  Barth Kirks. [place] Small, [place] [email or web address]  Final Clinical Impressions(s) / ED Diagnoses     ICD-10-CM   1. Hypocalcemia  E83.51       ED Discharge Orders          Ordered    calcium-vitamin D (OSCAL WITH D) 500-200 MG-UNIT tablet  2 times daily        01/17/21 0413             Discharge Instructions Discussed with and Provided to Patient:     Discharge Instructions      You were evaluated in the Emergency Department and after careful evaluation, we did not find any emergent condition requiring admission or further testing in the hospital.  Your exam/testing today is overall reassuring.  Symptoms seem to be due to low calcium levels.  MRI did not show any evidence of stroke.  Recommend close follow-up with your regular doctors.  Please take the calcium  vitamin D medication as prescribed.  Please return to the Emergency Department if you experience any worsening of your condition.   Thank you for allowing [place] to be a part of your care.        Maudie Flakes, MD 01/17/21 [phone]

## 2021-01-17 NOTE — ED Notes (Signed)
Pt given cheese and crackers with cranberry juice.

## 2021-01-17 NOTE — ED Notes (Signed)
Provider at bedside at this time

## 2021-01-17 NOTE — ED Notes (Signed)
Here due to numbness in the rt hand and lt side of face when initially started. Now the numbness is all over

## 2021-01-17 NOTE — ED Notes (Signed)
Pt transported to MRI at this time 

## 2021-01-18 ENCOUNTER — Other Ambulatory Visit (HOSPITAL_COMMUNITY): Payer: Self-pay

## 2021-01-19 ENCOUNTER — Inpatient Hospital Stay (HOSPITAL_COMMUNITY)
Admission: EM | Admit: 2021-01-19 | Discharge: 2021-01-21 | DRG: 640 | Disposition: A | Discharge status: Home / Self Care | Payer: Medicare Other | Attending: Internal Medicine | Admitting: Internal Medicine

## 2021-01-19 ENCOUNTER — Other Ambulatory Visit (HOSPITAL_COMMUNITY): Payer: Self-pay

## 2021-01-19 ENCOUNTER — Inpatient Hospital Stay: Payer: Medicare Other

## 2021-01-19 ENCOUNTER — Inpatient Hospital Stay: Payer: Medicare Other | Attending: Hematology | Admitting: Hematology

## 2021-01-19 ENCOUNTER — Telehealth: Payer: Self-pay

## 2021-01-19 ENCOUNTER — Other Ambulatory Visit: Payer: Self-pay | Admitting: Hematology

## 2021-01-19 ENCOUNTER — Ambulatory Visit: Payer: Medicare Other

## 2021-01-19 ENCOUNTER — Encounter: Payer: Self-pay | Admitting: Hematology

## 2021-01-19 ENCOUNTER — Encounter (HOSPITAL_COMMUNITY): Payer: Self-pay | Admitting: Emergency Medicine

## 2021-01-19 ENCOUNTER — Other Ambulatory Visit: Payer: Self-pay | Admitting: *Deleted

## 2021-01-19 ENCOUNTER — Inpatient Hospital Stay (HOSPITAL_COMMUNITY): Payer: Medicare Other

## 2021-01-19 ENCOUNTER — Other Ambulatory Visit: Payer: Self-pay

## 2021-01-19 VITALS — BP 158/86 | HR 80 | Temp 97.8°F | Resp 18 | Ht 61.0 in | Wt 163.4 lb

## 2021-01-19 DIAGNOSIS — F1910 Other psychoactive substance abuse, uncomplicated: Secondary | ICD-10-CM | POA: Diagnosis not present

## 2021-01-19 DIAGNOSIS — F1410 Cocaine abuse, uncomplicated: Secondary | ICD-10-CM | POA: Diagnosis present

## 2021-01-19 DIAGNOSIS — S78112A Complete traumatic amputation at level between left hip and knee, initial encounter: Secondary | ICD-10-CM

## 2021-01-19 DIAGNOSIS — Z79899 Other long term (current) drug therapy: Secondary | ICD-10-CM | POA: Diagnosis not present

## 2021-01-19 DIAGNOSIS — E1140 Type 2 diabetes mellitus with diabetic neuropathy, unspecified: Secondary | ICD-10-CM | POA: Diagnosis present

## 2021-01-19 DIAGNOSIS — N1832 Chronic kidney disease, stage 3b: Secondary | ICD-10-CM

## 2021-01-19 DIAGNOSIS — D500 Iron deficiency anemia secondary to blood loss (chronic): Secondary | ICD-10-CM

## 2021-01-19 DIAGNOSIS — Z809 Family history of malignant neoplasm, unspecified: Secondary | ICD-10-CM | POA: Diagnosis not present

## 2021-01-19 DIAGNOSIS — Z8673 Personal history of transient ischemic attack (TIA), and cerebral infarction without residual deficits: Secondary | ICD-10-CM

## 2021-01-19 DIAGNOSIS — J9601 Acute respiratory failure with hypoxia: Secondary | ICD-10-CM | POA: Diagnosis not present

## 2021-01-19 DIAGNOSIS — Z881 Allergy status to other antibiotic agents status: Secondary | ICD-10-CM

## 2021-01-19 DIAGNOSIS — I10 Essential (primary) hypertension: Secondary | ICD-10-CM | POA: Diagnosis present

## 2021-01-19 DIAGNOSIS — R9431 Abnormal electrocardiogram [ECG] [EKG]: Secondary | ICD-10-CM | POA: Diagnosis not present

## 2021-01-19 DIAGNOSIS — E876 Hypokalemia: Principal | ICD-10-CM | POA: Diagnosis present

## 2021-01-19 DIAGNOSIS — K219 Gastro-esophageal reflux disease without esophagitis: Secondary | ICD-10-CM | POA: Diagnosis present

## 2021-01-19 DIAGNOSIS — Z89512 Acquired absence of left leg below knee: Secondary | ICD-10-CM | POA: Diagnosis not present

## 2021-01-19 DIAGNOSIS — N189 Chronic kidney disease, unspecified: Secondary | ICD-10-CM

## 2021-01-19 DIAGNOSIS — E1151 Type 2 diabetes mellitus with diabetic peripheral angiopathy without gangrene: Secondary | ICD-10-CM | POA: Diagnosis present

## 2021-01-19 DIAGNOSIS — Z885 Allergy status to narcotic agent status: Secondary | ICD-10-CM | POA: Diagnosis not present

## 2021-01-19 DIAGNOSIS — Z8249 Family history of ischemic heart disease and other diseases of the circulatory system: Secondary | ICD-10-CM

## 2021-01-19 DIAGNOSIS — F17210 Nicotine dependence, cigarettes, uncomplicated: Secondary | ICD-10-CM | POA: Diagnosis present

## 2021-01-19 DIAGNOSIS — N1831 Chronic kidney disease, stage 3a: Secondary | ICD-10-CM

## 2021-01-19 DIAGNOSIS — E1122 Type 2 diabetes mellitus with diabetic chronic kidney disease: Secondary | ICD-10-CM | POA: Diagnosis present

## 2021-01-19 DIAGNOSIS — N179 Acute kidney failure, unspecified: Secondary | ICD-10-CM | POA: Diagnosis present

## 2021-01-19 DIAGNOSIS — R0602 Shortness of breath: Secondary | ICD-10-CM

## 2021-01-19 DIAGNOSIS — Z888 Allergy status to other drugs, medicaments and biological substances status: Secondary | ICD-10-CM | POA: Diagnosis not present

## 2021-01-19 DIAGNOSIS — F17200 Nicotine dependence, unspecified, uncomplicated: Secondary | ICD-10-CM | POA: Diagnosis present

## 2021-01-19 DIAGNOSIS — Z880 Allergy status to penicillin: Secondary | ICD-10-CM

## 2021-01-19 DIAGNOSIS — Z20822 Contact with and (suspected) exposure to covid-19: Secondary | ICD-10-CM | POA: Diagnosis present

## 2021-01-19 DIAGNOSIS — Z7982 Long term (current) use of aspirin: Secondary | ICD-10-CM

## 2021-01-19 HISTORY — DX: Hypomagnesemia: E83.42

## 2021-01-19 HISTORY — DX: Abnormal electrocardiogram (ECG) (EKG): R94.31

## 2021-01-19 LAB — BASIC METABOLIC PANEL
Anion gap: 16 — ABNORMAL HIGH (ref 5–15)
BUN: 12 mg/dL (ref 8–23)
CO2: 27 mmol/L (ref 22–32)
Calcium: 6.7 mg/dL — ABNORMAL LOW (ref 8.9–10.3)
Chloride: 101 mmol/L (ref 98–111)
Creatinine, Ser: 1.2 mg/dL — ABNORMAL HIGH (ref 0.44–1.00)
GFR, Estimated: 49 mL/min — ABNORMAL LOW (ref >60)
Glucose, Bld: 134 mg/dL — ABNORMAL HIGH (ref 70–99)
Potassium: 2.8 mmol/L — ABNORMAL LOW (ref 3.5–5.1)
Sodium: 144 mmol/L (ref 135–145)

## 2021-01-19 LAB — CBC WITH DIFFERENTIAL/PLATELET
Abs Immature Granulocytes: 0.09 10*3/uL — ABNORMAL HIGH (ref 0.00–0.07)
Basophils Absolute: 0 10*3/uL (ref 0.0–0.1)
Basophils Relative: 0 %
Eosinophils Absolute: 0.1 10*3/uL (ref 0.0–0.5)
Eosinophils Relative: 1 %
HCT: 44.8 % (ref 36.0–46.0)
Hemoglobin: 15.1 g/dL — ABNORMAL HIGH (ref 12.0–15.0)
Immature Granulocytes: 1 %
Lymphocytes Relative: 8 %
Lymphs Abs: 0.9 10*3/uL (ref 0.7–4.0)
MCH: 32.4 pg (ref 26.0–34.0)
MCHC: 33.7 g/dL (ref 30.0–36.0)
MCV: 96.1 fL (ref 80.0–100.0)
Monocytes Absolute: 1.1 10*3/uL — ABNORMAL HIGH (ref 0.1–1.0)
Monocytes Relative: 11 %
Neutro Abs: 8.4 10*3/uL — ABNORMAL HIGH (ref 1.7–7.7)
Neutrophils Relative %: 79 %
Platelets: 331 10*3/uL (ref 150–400)
RBC: 4.66 MIL/uL (ref 3.87–5.11)
RDW: 12.5 % (ref 11.5–15.5)
WBC: 10.6 10*3/uL — ABNORMAL HIGH (ref 4.0–10.5)
nRBC: 0 % (ref 0.0–0.2)

## 2021-01-19 LAB — CBC WITH DIFFERENTIAL (CANCER CENTER ONLY)
Abs Immature Granulocytes: 0.07 10*3/uL (ref 0.00–0.07)
Basophils Absolute: 0.1 10*3/uL (ref 0.0–0.1)
Basophils Relative: 1 %
Eosinophils Absolute: 0.1 10*3/uL (ref 0.0–0.5)
Eosinophils Relative: 2 %
HCT: 42 % (ref 36.0–46.0)
Hemoglobin: 14.5 g/dL (ref 12.0–15.0)
Immature Granulocytes: 1 %
Lymphocytes Relative: 13 %
Lymphs Abs: 1 10*3/uL (ref 0.7–4.0)
MCH: 32.4 pg (ref 26.0–34.0)
MCHC: 34.5 g/dL (ref 30.0–36.0)
MCV: 93.8 fL (ref 80.0–100.0)
Monocytes Absolute: 1 10*3/uL (ref 0.1–1.0)
Monocytes Relative: 12 %
Neutro Abs: 5.5 10*3/uL (ref 1.7–7.7)
Neutrophils Relative %: 71 %
Platelet Count: 267 10*3/uL (ref 150–400)
RBC: 4.48 MIL/uL (ref 3.87–5.11)
RDW: 12.2 % (ref 11.5–15.5)
WBC Count: 7.8 10*3/uL (ref 4.0–10.5)
nRBC: 0 % (ref 0.0–0.2)

## 2021-01-19 LAB — IRON AND TIBC
Iron: 99 ug/dL (ref 41–142)
Saturation Ratios: 45 % (ref 21–57)
TIBC: 221 ug/dL — ABNORMAL LOW (ref 236–444)
UIBC: 122 ug/dL (ref 120–384)

## 2021-01-19 LAB — URINALYSIS, ROUTINE W REFLEX MICROSCOPIC
Bacteria, UA: NONE SEEN
Bilirubin Urine: NEGATIVE
Glucose, UA: NEGATIVE mg/dL
Ketones, ur: 5 mg/dL — AB
Leukocytes,Ua: NEGATIVE
Nitrite: NEGATIVE
Protein, ur: >=300 mg/dL — AB
Specific Gravity, Urine: 1.01 (ref 1.005–1.030)
pH: 6 (ref 5.0–8.0)

## 2021-01-19 LAB — RAPID URINE DRUG SCREEN, HOSP PERFORMED
Amphetamines: NOT DETECTED
Barbiturates: NOT DETECTED
Benzodiazepines: NOT DETECTED
Cocaine: POSITIVE — AB
Opiates: NOT DETECTED
Tetrahydrocannabinol: POSITIVE — AB

## 2021-01-19 LAB — MAGNESIUM
Magnesium: <0.5 mg/dL — CL (ref 1.7–2.4)
Magnesium: 0.4 mg/dL — CL (ref 1.7–2.4)
Magnesium: 1.9 mg/dL (ref 1.7–2.4)

## 2021-01-19 LAB — RENAL FUNCTION PANEL
Albumin: 3.4 g/dL — ABNORMAL LOW (ref 3.5–5.0)
Anion gap: 12 (ref 5–15)
BUN: 13 mg/dL (ref 8–23)
CO2: 29 mmol/L (ref 22–32)
Calcium: 6.8 mg/dL — ABNORMAL LOW (ref 8.9–10.3)
Chloride: 102 mmol/L (ref 98–111)
Creatinine, Ser: 1.04 mg/dL — ABNORMAL HIGH (ref 0.44–1.00)
GFR, Estimated: 59 mL/min — ABNORMAL LOW (ref >60)
Glucose, Bld: 140 mg/dL — ABNORMAL HIGH (ref 70–99)
Phosphorus: 5.3 mg/dL — ABNORMAL HIGH (ref 2.5–4.6)
Potassium: 3.1 mmol/L — ABNORMAL LOW (ref 3.5–5.1)
Sodium: 143 mmol/L (ref 135–145)

## 2021-01-19 LAB — FERRITIN: Ferritin: 629 ng/mL — ABNORMAL HIGH (ref 11–307)

## 2021-01-19 LAB — COMPREHENSIVE METABOLIC PANEL
ALT: 13 U/L (ref 0–44)
AST: 23 U/L (ref 15–41)
Albumin: 3.2 g/dL — ABNORMAL LOW (ref 3.5–5.0)
Alkaline Phosphatase: 67 U/L (ref 38–126)
Anion gap: 12 (ref 5–15)
BUN: 11 mg/dL (ref 8–23)
CO2: 29 mmol/L (ref 22–32)
Calcium: 6.7 mg/dL — ABNORMAL LOW (ref 8.9–10.3)
Chloride: 100 mmol/L (ref 98–111)
Creatinine, Ser: 1 mg/dL (ref 0.44–1.00)
GFR, Estimated: >60 mL/min (ref >60)
Glucose, Bld: 140 mg/dL — ABNORMAL HIGH (ref 70–99)
Potassium: 3 mmol/L — ABNORMAL LOW (ref 3.5–5.1)
Sodium: 141 mmol/L (ref 135–145)
Total Bilirubin: 0.4 mg/dL (ref 0.3–1.2)
Total Protein: 6.6 g/dL (ref 6.5–8.1)

## 2021-01-19 LAB — CBG MONITORING, ED: Glucose-Capillary: 96 mg/dL (ref 70–99)

## 2021-01-19 LAB — TOTAL PROTEIN, URINE DIPSTICK: Protein, ur: 300 mg/dL — AB

## 2021-01-19 LAB — TROPONIN I (HIGH SENSITIVITY)
Troponin I (High Sensitivity): 13 ng/L (ref <18)
Troponin I (High Sensitivity): 17 ng/L (ref <18)

## 2021-01-19 LAB — PHOSPHORUS: Phosphorus: 5.6 mg/dL — ABNORMAL HIGH (ref 2.5–4.6)

## 2021-01-19 MED ORDER — GABAPENTIN 300 MG PO CAPS: 400.0000 mg | ORAL_CAPSULE | Freq: Three times a day (TID) | ORAL | Status: DC

## 2021-01-19 MED ORDER — ONDANSETRON HCL 4 MG/2ML IJ SOLN: 4.0000 mg | Freq: Once | INTRAMUSCULAR | Status: DC

## 2021-01-19 MED ORDER — ONDANSETRON HCL 4 MG PO TABS
4.0000 mg | ORAL_TABLET | Freq: Once | ORAL | Status: AC
  Administered 2021-01-19: 4 mg via ORAL
  Filled 2021-01-19: qty 1

## 2021-01-19 MED ORDER — LOSARTAN POTASSIUM 50 MG PO TABS
100.0000 mg | ORAL_TABLET | Freq: Every day | ORAL | Status: DC
  Administered 2021-01-20: 100 mg via ORAL
  Filled 2021-01-19: qty 4

## 2021-01-19 MED ORDER — INSULIN ASPART 100 UNIT/ML IJ SOLN
0.0000 [IU] | Freq: Three times a day (TID) | INTRAMUSCULAR | Status: DC
  Administered 2021-01-20: 1 [IU] via SUBCUTANEOUS
  Filled 2021-01-19: qty 0.09

## 2021-01-19 MED ORDER — CALCIUM GLUCONATE-NACL 1-0.675 GM/50ML-% IV SOLN
1.0000 g | Freq: Once | INTRAVENOUS | Status: AC
  Administered 2021-01-19: 1000 mg via INTRAVENOUS
  Filled 2021-01-19: qty 50

## 2021-01-19 MED ORDER — GABAPENTIN 400 MG PO CAPS
400.0000 mg | ORAL_CAPSULE | ORAL | Status: DC
  Administered 2021-01-20 (×2): 400 mg via ORAL
  Filled 2021-01-19 (×2): qty 1

## 2021-01-19 MED ORDER — AMLODIPINE BESYLATE 10 MG PO TABS
10.0000 mg | ORAL_TABLET | Freq: Every day | ORAL | Status: DC
  Administered 2021-01-20: 10 mg via ORAL
  Filled 2021-01-19: qty 2

## 2021-01-19 MED ORDER — GABAPENTIN 400 MG PO CAPS
800.0000 mg | ORAL_CAPSULE | Freq: Every day | ORAL | Status: DC
  Administered 2021-01-19 – 2021-01-20 (×2): 800 mg via ORAL
  Filled 2021-01-19 (×2): qty 2

## 2021-01-19 MED ORDER — NICOTINE 7 MG/24HR TD PT24
7.0000 mg | MEDICATED_PATCH | Freq: Every day | TRANSDERMAL | Status: DC
  Administered 2021-01-20: 7 mg via TRANSDERMAL
  Filled 2021-01-19: qty 1

## 2021-01-19 MED ORDER — ENOXAPARIN SODIUM 40 MG/0.4ML IJ SOSY
40.0000 mg | PREFILLED_SYRINGE | INTRAMUSCULAR | Status: DC
  Administered 2021-01-20 – 2021-01-21 (×2): 40 mg via SUBCUTANEOUS
  Filled 2021-01-19 (×2): qty 0.4

## 2021-01-19 MED ORDER — MAGNESIUM SULFATE 2 GM/50ML IV SOLN
2.0000 g | Freq: Once | INTRAVENOUS | Status: AC
  Administered 2021-01-19: 2 g via INTRAVENOUS
  Filled 2021-01-19: qty 50

## 2021-01-19 MED ORDER — SODIUM CHLORIDE 0.9 % IV SOLN
2.0000 g | Freq: Once | INTRAVENOUS | Status: AC
  Administered 2021-01-19: 2 g via INTRAVENOUS
  Filled 2021-01-19: qty 20

## 2021-01-19 MED ORDER — POTASSIUM CHLORIDE 10 MEQ/100ML IV SOLN
10.0000 meq | INTRAVENOUS | Status: AC
Start: 2021-01-19 — End: 2021-01-20
  Administered 2021-01-19 – 2021-01-20 (×6): 10 meq via INTRAVENOUS
  Filled 2021-01-19 (×6): qty 100

## 2021-01-19 MED ORDER — ASPIRIN EC 81 MG PO TBEC
81.0000 mg | DELAYED_RELEASE_TABLET | Freq: Every day | ORAL | Status: DC
  Administered 2021-01-20: 81 mg via ORAL
  Filled 2021-01-19: qty 1

## 2021-01-19 MED ORDER — ACETAMINOPHEN 325 MG PO TABS: 650.0000 mg | ORAL_TABLET | Freq: Four times a day (QID) | ORAL | Status: DC | PRN

## 2021-01-19 MED ORDER — LACTATED RINGERS IV SOLN: INTRAVENOUS | Status: DC

## 2021-01-19 MED ORDER — ACETAMINOPHEN 650 MG RE SUPP: 650.0000 mg | Freq: Four times a day (QID) | RECTAL | Status: DC | PRN

## 2021-01-19 MED ORDER — SODIUM CHLORIDE 0.9 % IV SOLN: Freq: Once | INTRAVENOUS | Status: AC

## 2021-01-19 MED ORDER — POTASSIUM CHLORIDE 10 MEQ/100ML IV SOLN
10.0000 meq | Freq: Once | INTRAVENOUS | Status: AC
  Administered 2021-01-19: 10 meq via INTRAVENOUS
  Filled 2021-01-19: qty 100

## 2021-01-19 MED ORDER — INSULIN ASPART 100 UNIT/ML IJ SOLN
0.0000 [IU] | Freq: Every day | INTRAMUSCULAR | Status: DC
  Administered 2021-01-19: 0 [IU] via SUBCUTANEOUS
  Filled 2021-01-19: qty 0.05

## 2021-01-19 MED ORDER — POTASSIUM CHLORIDE CRYS ER 20 MEQ PO TBCR
20.0000 meq | EXTENDED_RELEASE_TABLET | Freq: Two times a day (BID) | ORAL | 0 refills | Status: DC
  Filled 2021-01-19: qty 30, 21d supply, fill #0
  Filled 2021-01-19: qty 30, 15d supply, fill #0

## 2021-01-19 NOTE — Progress Notes (Signed)
Per Dr. Burr Medico no Bevacizumab or Feraheme today. Pt. to receive Calcium Gluconate IV. Instructed pt. to pick up Potassium Chloride from [facility] pharmacy and to take 2 tablets today then to follow direction on bottle for remaining medication. Pt. states she understands.

## 2021-01-19 NOTE — ED Provider Notes (Signed)
[facility]-EMERGENCY DEPT Provider Note   CSN: [id number] Arrival date & time: 01/19/21  1818     History No chief complaint on file.   [name] is a 70 y.o. female.  70-year-old female with prior medical history as detailed below presents from Dr. Feng's office for evaluation of reported low magnesium.  Patient was also diagnosed with low potassium earlier today as well.  Patient without significant symptoms.  She does report that when she tried to take oral potassium earlier today she had associated nausea and vomiting.  Patient reports that she was given IV calcium earlier today in the clinic with Dr. Feng.    The history is provided by the patient.  Illness Location:  Low magnesium, low potassium, low calcium Severity:  Moderate Onset quality:  Unable to specify Timing:  Unable to specify Progression:  Unable to specify     Past Medical History:  Diagnosis Date   Allergic rhinitis    Anemia, iron deficiency 05/03/2011   2/2 GI AVMs - recieves iron infusions as well as periodic red blood cell transfusions   Anxiety    Arteriovenous malformation of duodenum 02/25/2015   CARPAL TUNNEL RELEASE, HX OF 07/22/2008   Chest pain 10/28/2019   Colon polyps    Diabetes mellitus without complication (HCC)    type 2   Difficulty sleeping    takes trazadone for sleep   Diverticulosis    Fall 01/03/2020   GERD (gastroesophageal reflux disease)    Hepatitis C    in SVR with Harvoni 12/2014-02/2015.    History of hernia repair 08/18/2012   History of porphyria 11/30/2014   Porphyria cutanea tarda in setting of chronic Hepatitis C, frequent  IV iron infusions, and alcohol abuse    Hypertension    Hypocalcemia 10/28/2019   Hypokalemia 09/27/2014   Peripheral vascular disease (HCC)    Stroke (HCC) 2010   no residual deficits   Substance use disorder    cocaine   Tachycardia 07/10/2017   Trichomonas vaginitis 04/16/2019    Patient Active Problem List    Diagnosis Date Noted   Numbness of face 01/16/2021   Stress incontinence in female 01/11/2021   Tingling in extremities 10/13/2020   Abdominal pain 10/12/2020   Grief 07/21/2020   GI bleed 07/07/2020   Spleen hematoma 11/02/2019   Microscopic hematuria 07/17/2019   Sleep disorder 02/04/2019   Kidney disease 02/04/2019   Generalized anxiety disorder 07/24/2017   Tobacco use disorder 07/24/2017   Atherosclerosis of abdominal aorta (HCC) 10/25/2016   Gout involving toe of right foot 02/28/2016   History of hepatitis C 03/15/2015   Substance use disorder 03/05/2015   Iron deficiency anemia due to chronic blood loss 02/23/2015   Vitamin D deficiency 12/02/2014   Preventative health care 11/30/2014   Depression 07/10/2013   Type 2 diabetes mellitus with diabetic neuropathy (HCC) 06/07/2013   Above knee amputation of left lower extremity (HCC) 08/18/2012   Angiodysplasia of duodenum 08/03/2011   Essential hypertension, benign 07/22/2008   GERD 07/22/2008    Past Surgical History:  Procedure Laterality Date   ABDOMINAL HYSTERECTOMY     APPLICATION OF WOUND VAC N/A 06/21/2014   Procedure: APPLICATION OF WOUND VAC;  Surgeon: Douglas Blackman, MD;  Location: [facility] OR;  Service: General;  Laterality: N/A;   CARPAL TUNNEL RELEASE     rt hand   COLONOSCOPY WITH PROPOFOL N/A 03/17/2018   Procedure: COLONOSCOPY WITH PROPOFOL;  Surgeon: Danis, Henry L III, MD;    Location: [facility];  Service: Gastroenterology;  Laterality: N/A;   ENTEROSCOPY N/A 12/04/2012   Procedure: ENTEROSCOPY;  Surgeon: Beryle Beams, MD;  Location: [facility];  Service: Endoscopy;  Laterality: N/A;   ENTEROSCOPY N/A 12/18/2012   Procedure: ENTEROSCOPY;  Surgeon: Beryle Beams, MD;  Location: [facility];  Service: Endoscopy;  Laterality: N/A;   ENTEROSCOPY N/A 02/25/2015   Procedure: ENTEROSCOPY;  Surgeon: Inda Castle, MD;  Location: [facility] END[facility]COPY;  Service: Endoscopy;  Laterality: N/A;   ENTEROSCOPY N/A 10/29/2017    Procedure: ENTEROSCOPY;  Surgeon: Doran Stabler, MD;  Location: [facility];  Service: Gastroenterology;  Laterality: N/A;   ENTEROSCOPY N/A 07/08/2020   Procedure: ENTEROSCOPY;  Surgeon: Milus Banister, MD;  Location: [facility];  Service: Endoscopy;  Laterality: N/A;   ESOPHAGOGASTRODUODENOSCOPY  05/05/2011   Procedure: ESOPHAGOGASTRODUODENOSCOPY (EGD);  Surgeon: Zenovia Jarred, MD;  Location: [facility];  Service: Gastroenterology;  Laterality: N/A;  Dr. Hilarie Fredrickson will do procedure for Dr. Benson Norway Saturday.   ESOPHAGOGASTRODUODENOSCOPY  05/08/2011   Procedure: ESOPHAGOGASTRODUODENOSCOPY (EGD);  Surgeon: Beryle Beams;  Location: [facility];  Service: Endoscopy;  Laterality: N/A;   ESOPHAGOGASTRODUODENOSCOPY  06/07/2011   Procedure: ESOPHAGOGASTRODUODENOSCOPY (EGD);  Surgeon: Beryle Beams, MD;  Location: [facility];  Service: Endoscopy;  Laterality: N/A;   ESOPHAGOGASTRODUODENOSCOPY  12/20/2011   Procedure: ESOPHAGOGASTRODUODENOSCOPY (EGD);  Surgeon: Beryle Beams, MD;  Location: [facility];  Service: Endoscopy;  Laterality: N/A;   EYE SURGERY Bilateral    cataracts   FLEXIBLE SIGMOIDOSCOPY  12/21/2011   Procedure: FLEXIBLE SIGMOIDOSCOPY;  Surgeon: Beryle Beams, MD;  Location: [facility];  Service: Endoscopy;  Laterality: N/A;   HOT HEMOSTASIS  06/07/2011   Procedure: HOT HEMOSTASIS (ARGON PLASMA COAGULATION/BICAP);  Surgeon: Beryle Beams, MD;  Location: [facility];  Service: Endoscopy;  Laterality: N/A;   HOT HEMOSTASIS N/A 07/08/2020   Procedure: HOT HEMOSTASIS (ARGON PLASMA COAGULATION/BICAP);  Surgeon: Milus Banister, MD;  Location: [facility];  Service: Endoscopy;  Laterality: N/A;   INCISIONAL HERNIA REPAIR N/A 06/01/2014   Procedure: ATTEMPTED LAPAROSCOPIC AND OPEN INCISIONAL HERNIA REPAIR WITH MESH;  Surgeon: Coralie Keens, MD;  Location: [place];  Service: General;  Laterality: N/A;   INSERTION OF MESH N/A 06/01/2014   Procedure: INSERTION OF MESH;  Surgeon: Coralie Keens, MD;  Location: [place];  Service: General;  Laterality: N/A;   LAPAROTOMY  02/16/2012   Procedure: EXPLORATORY LAPAROTOMY;  Surgeon: Edward Jolly, MD;  Location: [facility];  Service: General;  Laterality: N/A;  oversewing of anastomotic leak and rigid sigmoidoscopy   LAPAROTOMY N/A 06/21/2014   Procedure: ABDOMINAL WOUND EXPLORATION;  Surgeon: Coralie Keens, MD;  Location: [place];  Service: General;  Laterality: N/A;   LEG AMPUTATION ABOVE KNEE     left   PARTIAL COLECTOMY  02/15/2012   Procedure: PARTIAL COLECTOMY;  Surgeon: Harl Bowie, MD;  Location: [facility];  Service: General;  Laterality: N/A;   [place] INJECTION  07/08/2020   Procedure: SUBMUCOSAL INJECTION;  Surgeon: Milus Banister, MD;  Location: [facility];  Service: Endoscopy;;   TONSILLECTOMY       OB History   No obstetric history on file.     Family History  Problem Relation Age of Onset   Cancer Father        unknown   Hypertension Mother    Diverticulitis Mother     Social History   Tobacco Use   Smoking status: Some Days    Packs/day:  0.50    Years: 17.00    Pack years: 8.50    Types: Cigarettes   Smokeless tobacco: Never   Tobacco comments:    1 pack last 1 month  Vaping Use   Vaping Use: Never used  Substance Use Topics   Alcohol use: Yes    Alcohol/week: 0.0 standard drinks   Drug use: Yes    Types: Cocaine, Marijuana    Comment: Sometimes.    Home Medications Prior to Admission medications   Medication Sig Start Date End Date Taking? Authorizing Provider  ACCU-CHEK FASTCLIX LANCETS MISC Use to test blood glucose 3 times daily. 10/04/16   [name], MD  ACCU-CHEK GUIDE test strip USE TO TEST BLOOD SUGAR THREE TIMES A DAY 11/10/20   Sanjuan Dame, MD  amLODipine (NORVASC) 10 MG tablet Take 1 tablet (10 mg total) by mouth daily. 07/21/20   Jose Persia, MD  aspirin EC 81 MG tablet Take 1 tablet (81 mg total) by mouth daily. 03/04/15   Burgess Estelle, MD  Blood  Glucose Monitoring Suppl (ACCU-CHEK GUIDE) w/Device KIT 1 each by Does not apply route 3 (three) times daily. The patient is insulin requiring, ICD 10 code E11.65. The patient tests 3 times a day. 02/05/19   Jose Persia, MD  calcium-vitamin D (OSCAL WITH D) 500-200 MG-UNIT tablet Take 1 tablet by mouth 2 (two) times daily for 7 days. 01/17/21 01/24/21  Maudie Flakes, MD  cetirizine (ZYRTEC) 10 MG tablet Take 1 tablet (10 mg total) by mouth daily. 11/17/20   Harvie Heck, MD  Dulaglutide 0.75 MG/0.5ML SOPN INJECT 0.75 MG INTO THE SKIN ONCE A WEEK. Patient taking differently: Inject 0.75 mg into the skin once a week. Sunday 07/21/20 07/21/21  Jose Persia, MD  feeding supplement, GLUCERNA SHAKE, (GLUCERNA SHAKE) LIQD Take 237 mLs by mouth 3 (three) times daily between meals. Patient taking differently: Take 237 mLs by mouth 2 (two) times daily between meals. 10/04/16   [name], MD  gabapentin (NEURONTIN) 400 MG capsule TAKE 1 CAPSULE BY MOUTH EVERY MORNING AND THEN TAKE 1 CAPSULE IN THE AFTERNOON; THEN 2 CAPSULES BY MOUTH BEFORE BEDTIME 11/30/20   Jose Persia, MD  losartan (COZAAR) 100 MG tablet TAKE 1 TABLET (100 MG TOTAL) BY MOUTH DAILY. Patient taking differently: Take 100 mg by mouth daily. 07/21/20 07/21/21  Jose Persia, MD  omeprazole (PRILOSEC) 20 MG capsule TAKE 1 CAPSULE BY MOUTH DAILY 11/30/20   Jose Persia, MD  potassium chloride SA (KLOR-CON) 20 MEQ tablet Take 1 tablet (20 mEq total) by mouth 2 (two) times daily for one week then once daily until finish 01/19/21   Truitt Merle, MD    Allergies    Ciprofloxacin, Morphine and related, Penicillins, Codeine, Lisinopril, and Feraheme [ferumoxytol]  Review of Systems   Review of Systems  All other systems reviewed and are negative.  Physical Exam Updated Vital Signs BP (!) 148/87   Pulse 86   Temp 98.3 F (36.8 C) (Oral)   Resp 20 Comment: Simultaneous filing. User may not have seen previous data.  SpO2 96%   Physical  Exam Vitals and nursing note reviewed.  Constitutional:      General: She is not in acute distress.    Appearance: Normal appearance. She is well-developed.  HENT:     Head: Normocephalic and atraumatic.  Eyes:     Conjunctiva/sclera: Conjunctivae normal.     Pupils: Pupils are equal, round, and reactive to light.  Cardiovascular:     Rate  and Rhythm: Normal rate and regular rhythm.     Heart sounds: Normal heart sounds.  Pulmonary:     Effort: Pulmonary effort is normal. No respiratory distress.     Breath sounds: Normal breath sounds.  Abdominal:     General: There is no distension.     Palpations: Abdomen is soft.     Tenderness: There is no abdominal tenderness.  Musculoskeletal:        General: No deformity. Normal range of motion.     Cervical back: Normal range of motion and neck supple.  Skin:    General: Skin is warm and dry.  Neurological:     General: No focal deficit present.     Mental Status: She is alert and oriented to person, place, and time.    ED Results / Procedures / Treatments   Labs (all labs ordered are listed, but only abnormal results are displayed) Labs Reviewed  BASIC METABOLIC PANEL - Abnormal; Notable for the following components:      Result Value   Potassium 2.8 (*)    Glucose, Bld 134 (*)    Creatinine, Ser 1.20 (*)    Calcium 6.7 (*)    GFR, Estimated 49 (*)    Anion gap 16 (*)    All other components within normal limits  CBC WITH DIFFERENTIAL/PLATELET - Abnormal; Notable for the following components:   WBC 10.6 (*)    Hemoglobin 15.1 (*)    Neutro Abs 8.4 (*)    Monocytes Absolute 1.1 (*)    Abs Immature Granulocytes 0.09 (*)    All other components within normal limits  MAGNESIUM - Abnormal; Notable for the following components:   Magnesium 0.4 (*)    All other components within normal limits  PHOSPHORUS - Abnormal; Notable for the following components:   Phosphorus 5.6 (*)    All other components within normal limits   URINALYSIS, ROUTINE W REFLEX MICROSCOPIC - Abnormal; Notable for the following components:   APPearance HAZY (*)    Hgb urine dipstick SMALL (*)    Ketones, ur 5 (*)    Protein, ur >=300 (*)    All other components within normal limits  RAPID URINE DRUG SCREEN, HOSP PERFORMED - Abnormal; Notable for the following components:   Cocaine POSITIVE (*)    Tetrahydrocannabinol POSITIVE (*)    All other components within normal limits  TROPONIN I (HIGH SENSITIVITY)    EKG EKG Interpretation  Date/Time:  Thursday January 19 2021 19:21:49 EDT Ventricular Rate:  85 PR Interval:  190 QRS Duration: 83 QT Interval:  409 QTC Calculation: 487 R Axis:   10 Text Interpretation: Sinus rhythm Probable left atrial enlargement Probable left ventricular hypertrophy Nonspecific T abnormalities, lateral leads Borderline prolonged QT interval Confirmed by ,  ([id number]) on 01/19/2021 7:26:10 PM  Radiology No results found.  Procedures Procedures   Medications Ordered in ED Medications  magnesium sulfate IVPB 2 g 50 mL (2 g Intravenous New Bag/Given 01/19/21 2000)  potassium chloride 10 mEq in 100 mL IVPB (10 mEq Intravenous New Bag/Given 01/19/21 2002)  magnesium sulfate IVPB 2 g 50 mL (has no administration in time range)  ondansetron (ZOFRAN) tablet 4 mg (4 mg Oral Given 01/19/21 1857)    ED Course  I have reviewed the triage vital signs and the nursing notes.  Pertinent labs & imaging results that were available during my care of the patient were reviewed by me and considered in my medical decision making (see chart for   details).    MDM Rules/Calculators/A&P                           MDM  MSE complete  Leiliana D Ratterree was evaluated in Emergency Department on 01/19/2021 for the symptoms described in the history of present illness. She was evaluated in the context of the global COVID-19 pandemic, which necessitated consideration that the patient might be at risk for infection with  the SARS-CoV-2 virus that causes COVID-19. Institutional protocols and algorithms that pertain to the evaluation of patients at risk for COVID-19 are in a state of rapid change based on information released by regulatory bodies including the CDC and federal and state organizations. These policies and algorithms were followed during the patient's care in the ED.  Patient with significant hypomagnesia.  Hypokalemia also noted.  Patient will require admission for lengthy repletion.  Hospitalist service is aware of case and will evaluate for admission.   Final Clinical Impression(s) / ED Diagnoses   Final diagnoses:  Hypomagnesemia  Hypokalemia    Rx / DC Orders ED Discharge Orders     None        ,  C, MD 01/19/21 2047  

## 2021-01-19 NOTE — ED Notes (Signed)
Pt. Documented in error see above note in chart. 

## 2021-01-19 NOTE — Progress Notes (Addendum)
[place]   [phone] [phone]   Clinic Follow up Note   Patient Care Team: Jose Persia, MD as PCP - Evalina Field, MD as Consulting Physician (Ophthalmology) Coralie Keens, MD as Consulting Physician (General Surgery) Truitt Merle, MD as Consulting Physician (Hematology) Comer, Okey Regal, MD as Consulting Physician (Infectious Diseases) Loletha Carrow Kirke Corin, MD as Consulting Physician (Gastroenterology)  Date of Service:  01/19/2021  CHIEF COMPLAINT: f/u of anemia  ASSESSMENT & PLAN:  [name] is a 70 y.o. female with   1. Iron deficiency anemia secondary to GI blood loss, and Anemia of Chronic Disease -She has a longstanding history of intermittent anemia dating back at least 2008 Hg was 10. - History of multiple GI bleeding in 2016, required blood transfusion, hospitalization and IV Feraheme -She had an EGD and colonoscopy on 10/29/2017 and 03/17/2018 with Dr. Loletha Carrow, which were normal. 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 -She has been on oral iron for 5-6 years now and has had intermittent black watery stool. -She has been receiving IV Feraheme every 1-2 month on average lately. Goal is to keep her Hg >10 and ferritin>100. Her last IV Feraheme was on 12/21/20 -She presented with acute GI bleeding in 06/2020, hospitalized and received 1 blood transfusion. Small bowel endoscopy Dr. Ardis Hughs on 07/08/20 showed non-bleeding AVM. This was cauterized.  CT was negative for occult malignancy. -Due to recurrent anemia requiring IV Feraheme and blood transfusions, she started bevacizumab for GI bleeding on 08/10/20  -She responded well, anemia resolved since starting Beva, held after 2 doses due to increasing BP and proteinuria.  We have continued Feraheme infusions as needed   2. Facial, b/l arm numbness -cause unknown -experienced facial numbness on the right that spreads to the left side.  -facial twitching  also noted today. -brain MRI w/o contrast 01/17/21 was negative  3. Hypocalcemia -she was found to have calcium level of 5.8 on 01/16/21 when she presented to the ED for facial numbness. -I requested repeat calcium lab and PTH to be done today.   4. 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 Comorbidities: DM2, Trouble sleeping, GERD, chronic Hep C infection, cocaine use -Management per PCP's office.  -GERD uncontrolled on Tums and omeprazole 20 mg once daily -positive for hep C in 2009, received treatment in 2016. 05/2018 liver US unremarkable -h/o cocaine use, most recently 01/16/21 on urine test   6. Left AKA, has prosthesis; Right LE edema -continues to have mild right LE edema.   PLAN: -No Avastin infusion today, plan for next infusion in 4 weeks -will check RFP, PTH and magnesium level -iv calcium if needed today and weekly for next 4 weeks -will reach out to her PCP about her hypocalcemia management   Addendum Pt received 2g iv calcium in our office today and went home. Her magnesium came back '5mg'$ /dl. We called pt and told her to go to ED as soon as possible. She voiced good understanding and agreed to go.  Truitt Merle  01/19/2021   CURRENT THERAPY:  -IV Feraheme as needed -Blood transfusions as needed, last 08/05/20 -Bevacizumab (Zirabev) starting 08/10/20. Held periodically due to increased BP and proteinuria.   INTERVAL HISTORY:  [name] is here for a follow up of anemia. She was last seen by me on 11/23/20 and by NP Lacie in the interim. She presents to the clinic alone. She reports  she still has numbness to her face. She denies diarrhea. She notes she does not have a good appetite and is not eating much.   All other systems were reviewed with the patient and are negative.  MEDICAL HISTORY:  Past Medical History:  Diagnosis Date   Allergic rhinitis    Anemia, iron deficiency 05/03/2011   2/2 GI AVMs - recieves iron infusions as well  as periodic red blood cell transfusions   Anxiety    Arteriovenous malformation of duodenum 02/25/2015   CARPAL TUNNEL RELEASE, HX OF 07/22/2008   Chest pain 10/28/2019   Colon polyps    Diabetes mellitus without complication ([place])    type 2   Difficulty sleeping    takes trazadone for sleep   Diverticulosis    Fall 01/03/2020   GERD (gastroesophageal reflux disease)    Hepatitis C    in SVR with Harvoni 12/2014-02/2015.    History of hernia repair 08/18/2012   History of porphyria 11/30/2014   Porphyria cutanea tarda in setting of chronic Hepatitis C, frequent  IV iron infusions, and alcohol abuse    Hypertension    Hypocalcemia 10/28/2019   Hypokalemia 09/27/2014   Peripheral vascular disease ([place])    Stroke ([place]) 2010   no residual deficits   Substance use disorder    cocaine   Tachycardia 07/10/2017   Trichomonas vaginitis 04/16/2019    SURGICAL HISTORY: Past Surgical History:  Procedure Laterality Date   ABDOMINAL HYSTERECTOMY     APPLICATION OF WOUND VAC N/A 06/21/2014   Procedure: APPLICATION OF WOUND VAC;  Surgeon: Coralie Keens, MD;  Location: [facility];  Service: General;  Laterality: N/A;   CARPAL TUNNEL RELEASE     rt hand   COLONOSCOPY WITH PROPOFOL N/A 03/17/2018   Procedure: COLONOSCOPY WITH PROPOFOL;  Surgeon: Doran Stabler, MD;  Location: [facility];  Service: Gastroenterology;  Laterality: N/A;   ENTEROSCOPY N/A 12/04/2012   Procedure: ENTEROSCOPY;  Surgeon: Beryle Beams, MD;  Location: [facility];  Service: Endoscopy;  Laterality: N/A;   ENTEROSCOPY N/A 12/18/2012   Procedure: ENTEROSCOPY;  Surgeon: Beryle Beams, MD;  Location: [facility];  Service: Endoscopy;  Laterality: N/A;   ENTEROSCOPY N/A 02/25/2015   Procedure: ENTEROSCOPY;  Surgeon: Inda Castle, MD;  Location: [facility];  Service: Endoscopy;  Laterality: N/A;   ENTEROSCOPY N/A 10/29/2017   Procedure: ENTEROSCOPY;  Surgeon: Doran Stabler, MD;  Location: [facility];  Service:  Gastroenterology;  Laterality: N/A;   ENTEROSCOPY N/A 07/08/2020   Procedure: ENTEROSCOPY;  Surgeon: Milus Banister, MD;  Location: [facility];  Service: Endoscopy;  Laterality: N/A;   ESOPHAGOGASTRODUODENOSCOPY  05/05/2011   Procedure: ESOPHAGOGASTRODUODENOSCOPY (EGD);  Surgeon: Zenovia Jarred, MD;  Location: [facility];  Service: Gastroenterology;  Laterality: N/A;  Dr. Hilarie Fredrickson will do procedure for Dr. Benson Norway Saturday.   ESOPHAGOGASTRODUODENOSCOPY  05/08/2011   Procedure: ESOPHAGOGASTRODUODENOSCOPY (EGD);  Surgeon: Beryle Beams;  Location: [facility];  Service: Endoscopy;  Laterality: N/A;   ESOPHAGOGASTRODUODENOSCOPY  06/07/2011   Procedure: ESOPHAGOGASTRODUODENOSCOPY (EGD);  Surgeon: Beryle Beams, MD;  Location: [facility];  Service: Endoscopy;  Laterality: N/A;   ESOPHAGOGASTRODUODENOSCOPY  12/20/2011   Procedure: ESOPHAGOGASTRODUODENOSCOPY (EGD);  Surgeon: Beryle Beams, MD;  Location: [facility];  Service: Endoscopy;  Laterality: N/A;   EYE SURGERY Bilateral    cataracts   FLEXIBLE SIGMOIDOSCOPY  12/21/2011   Procedure: FLEXIBLE SIGMOIDOSCOPY;  Surgeon: Beryle Beams, MD;  Location: [facility];  Service: Endoscopy;  Laterality: N/A;  HOT HEMOSTASIS  06/07/2011   Procedure: HOT HEMOSTASIS (ARGON PLASMA COAGULATION/BICAP);  Surgeon: Theda Belfast, MD;  Location: [facility];  Service: Endoscopy;  Laterality: N/A;   HOT HEMOSTASIS N/A 07/08/2020   Procedure: HOT HEMOSTASIS (ARGON PLASMA COAGULATION/BICAP);  Surgeon: Rachael Fee, MD;  Location: [facility];  Service: Endoscopy;  Laterality: N/A;   INCISIONAL HERNIA REPAIR N/A 06/01/2014   Procedure: ATTEMPTED LAPAROSCOPIC AND OPEN INCISIONAL HERNIA REPAIR WITH MESH;  Surgeon: Abigail Miyamoto, MD;  Location: [facility] OR;  Service: General;  Laterality: N/A;   INSERTION OF MESH N/A 06/01/2014   Procedure: INSERTION OF MESH;  Surgeon: Abigail Miyamoto, MD;  Location: [facility];  Service: General;  Laterality: N/A;   LAPAROTOMY  02/16/2012    Procedure: EXPLORATORY LAPAROTOMY;  Surgeon: Mariella Saa, MD;  Location: [facility];  Service: General;  Laterality: N/A;  oversewing of anastomotic leak and rigid sigmoidoscopy   LAPAROTOMY N/A 06/21/2014   Procedure: ABDOMINAL WOUND EXPLORATION;  Surgeon: Abigail Miyamoto, MD;  Location: [facility];  Service: General;  Laterality: N/A;   LEG AMPUTATION ABOVE KNEE     left   PARTIAL COLECTOMY  02/15/2012   Procedure: PARTIAL COLECTOMY;  Surgeon: Shelly Rubenstein, MD;  Location: [facility];  Service: General;  Laterality: N/A;   SUBMUCOSAL INJECTION  07/08/2020   Procedure: SUBMUCOSAL INJECTION;  Surgeon: Rachael Fee, MD;  Location: [facility];  Service: Endoscopy;;   TONSILLECTOMY      I have reviewed the social history and family history with the patient and they are unchanged from previous note.  ALLERGIES:  is allergic to ciprofloxacin, morphine and related, penicillins, codeine, lisinopril, and feraheme [ferumoxytol].  MEDICATIONS:  Current Outpatient Medications  Medication Sig Dispense Refill   ACCU-CHEK FASTCLIX LANCETS MISC Use to test blood glucose 3 times daily. 100 each 11   ACCU-CHEK GUIDE test strip USE TO TEST BLOOD SUGAR THREE TIMES A DAY 100 strip 3   amLODipine (NORVASC) 10 MG tablet Take 1 tablet (10 mg total) by mouth daily. 90 tablet 3   aspirin EC 81 MG tablet Take 1 tablet (81 mg total) by mouth daily. 30 tablet 1   Blood Glucose Monitoring Suppl (ACCU-CHEK GUIDE) w/Device KIT 1 each by Does not apply route 3 (three) times daily. The patient is insulin requiring, ICD 10 code E11.65. The patient tests 3 times a day. 1 kit 1   calcium-vitamin D (OSCAL WITH D) 500-200 MG-UNIT tablet Take 1 tablet by mouth 2 (two) times daily for 7 days. 14 tablet 0   cetirizine (ZYRTEC) 10 MG tablet Take 1 tablet (10 mg total) by mouth daily. 30 tablet 0   Dulaglutide 0.75 MG/0.5ML SOPN INJECT 0.75 MG INTO THE SKIN ONCE A WEEK. (Patient taking differently: Inject 0.75 mg into the skin  once a week. Sunday) 6 mL 3   feeding supplement, GLUCERNA SHAKE, (GLUCERNA SHAKE) LIQD Take 237 mLs by mouth 3 (three) times daily between meals. (Patient taking differently: Take 237 mLs by mouth 2 (two) times daily between meals.) 90 Can 0   gabapentin (NEURONTIN) 400 MG capsule TAKE 1 CAPSULE BY MOUTH EVERY MORNING AND THEN TAKE 1 CAPSULE IN THE AFTERNOON; THEN 2 CAPSULES BY MOUTH BEFORE BEDTIME 120 capsule 1   losartan (COZAAR) 100 MG tablet TAKE 1 TABLET (100 MG TOTAL) BY MOUTH DAILY. (Patient taking differently: Take 100 mg by mouth daily.) 90 tablet 3   omeprazole (PRILOSEC) 20 MG capsule TAKE 1 CAPSULE BY MOUTH DAILY 30 capsule 2  No current facility-administered medications for this visit.    PHYSICAL EXAMINATION: ECOG PERFORMANCE STATUS: 2 - Symptomatic, <50% confined to bed  Vitals:   01/19/21 0923  BP: (!) 158/86  Pulse: 80  Resp: 18  Temp: 97.8 F (36.6 C)  SpO2: 98%   Wt Readings from Last 3 Encounters:  01/19/21 163 lb 6.4 oz (74.1 kg)  01/17/21 169 lb (76.7 kg)  01/16/21 167 lb 9.6 oz (76 kg)     GENERAL:alert, no distress and comfortable SKIN: skin color normal, no rashes or significant lesions EYES: normal, Conjunctiva are pink and non-injected, sclera clear  OROPHARYNX:no exudate, no erythema and lips, buccal mucosa, and tongue normal HEART: mild (+) right lower extremity edema NEURO: alert & oriented x 3 with fluent speech, no focal motor/sensory deficits  LABORATORY DATA:  I have reviewed the data as listed CBC Latest Ref Rng & Units 01/19/2021 01/16/2021 01/16/2021  WBC 4.0 - 10.5 K/uL 7.8 - 8.3  Hemoglobin 12.0 - 15.0 g/dL 14.5 16.0(H) 15.2(H)  Hematocrit 36.0 - 46.0 % 42.0 47.0(H) 45.0  Platelets 150 - 400 K/uL 267 - 293     CMP Latest Ref Rng & Units 01/16/2021 01/16/2021 11/17/2020  Glucose 70 - 99 mg/dL 140(H) 139(H) 89  BUN 8 - 23 mg/dL $Remove'16 13 24  'XSiewiA$ Creatinine 0.44 - 1.00 mg/dL 0.90 1.01(H) 1.18(H)  Sodium 135 - 145 mmol/L 141 141 141  Potassium  3.5 - 5.1 mmol/L 3.7 3.6 3.7  Chloride 98 - 111 mmol/L 101 101 101  CO2 22 - 32 mmol/L - 27 21  Calcium 8.9 - 10.3 mg/dL - 5.8(LL) 9.1  Total Protein 6.5 - 8.1 g/dL - 6.7 -  Total Bilirubin 0.3 - 1.2 mg/dL - 0.5 -  Alkaline Phos 38 - 126 U/L - 67 -  AST 15 - 41 U/L - 23 -  ALT 0 - 44 U/L - 12 -      RADIOGRAPHIC STUDIES: I have personally reviewed the radiological images as listed and agreed with the findings in the report. No results found.    Orders Placed This Encounter  Procedures   Renal function panel    Please add on to lab draw today    Standing Status:   Future    Number of Occurrences:   1    Standing Expiration Date:   01/19/2022   PTH, Intact and Calcium    Add to lab draw today   Magnesium    Add to lab draw today    Standing Status:   Future    Number of Occurrences:   1    Standing Expiration Date:   01/19/2022   All questions were answered. The patient knows to call the clinic with any problems, questions or concerns. No barriers to learning was detected. The total time spent in the appointment was 30 minutes.     Truitt Merle, MD 01/19/2021   I, Wilburn Mylar, am acting as scribe for Truitt Merle, MD.   I have reviewed the above documentation for accuracy and completeness, and I agree with the above.

## 2021-01-19 NOTE — Telephone Encounter (Signed)
This nurse spoke with patient and informed of low magnesium level.  Advised that Dr. Burr Medico would like her to go to Ed as soon as possible.  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  This nurse advised that she will inform the MD. No further questions or concerns at this time.

## 2021-01-19 NOTE — ED Triage Notes (Signed)
Magnesium was 0.5 on labs taken this morning before iron infusion, called and told pt to come to ED, MD Annamaria Boots. States she has felt weak and dizzy since taking her potassium pills that she started today.

## 2021-01-19 NOTE — Telephone Encounter (Signed)
CRITICAL VALUE STICKER  CRITICAL VALUE:  MAGNESIUM <0.5  RECEIVER (on-site recipient of call): Jamez Ambrocio P. LPN  DATE & TIME NOTIFIED: 01/19/21 12:57 p.m.  MESSENGER (representative from lab): Rosann Auerbach  MD NOTIFIED: Dr. Burr Medico  TIME OF NOTIFICATION: 1:00p.m. RESPONSE: Call patient and have her report to the ED.

## 2021-01-19 NOTE — Patient Instructions (Addendum)
Calcium Gluconate Injection What is this medication? CALCIUM GLUCONATE (KAL see um GLOO koe nate) increases calcium levels in your body. Calcium is a mineral that plays an important role in building strong bones and maintaining heart health. This medicine may be used for other purposes; ask your health care provider or pharmacist if you have questions. What should I tell my care team before I take this medication? They need to know if you have any of these conditions: High levels of calcium in the blood History of irregular heartbeat History of kidney stones Kidney disease Parathyroid disease An unusual or allergic reaction to calcium, other medications, foods, dyes, or preservatives Pregnant or trying to get pregnant Breast-feeding How should I use this medication? This medication is injected into a vein. It is given by your care team in a hospital or clinic. Talk to your care team regarding the use of this medication in children. While it may be given to children for selected conditions, precautions do apply. Overdosage: If you think you have taken too much of this medicine contact a poison control center or emergency room at once. NOTE: This medicine is only for you. Do not share this medicine with others. What if I miss a dose? This does not apply. What may interact with this medication? Ceftriaxone Certain diuretics Digoxin Other calcium products This list may not describe all possible interactions. Give your health care provider a list of all the medicines, herbs, non-prescription drugs, or dietary supplements you use. Also tell them if you smoke, drink alcohol, or use illegal drugs. Some items may interact with your medicine. What should I watch for while using this medication? Your condition will be monitored carefully while you are receiving this medication. You may need blood work while you are taking this medication. What side effects may I notice from receiving this  medication? Side effects that you should report to your care team as soon as possible: Allergic reactions-skin rash, itching, hives, swelling of the face, lips, tongue, or throat Fast or irregular heartbeat High calcium level-increased thirst or amount of urine, nausea, vomiting, confusion, unusual weakness or fatigue, bone pain Low blood pressure-dizziness, feeling faint or lightheaded, blurry vision Pain, redness, or irritation at injection site Side effects that usually do not require medical attention (report to your care team if they continue or are bothersome): Change in taste Flushing, mostly over the face, neck, and chest, during injection This list may not describe all possible side effects. Call your doctor for medical advice about side effects. You may report side effects to FDA at 1-800-FDA-1088. Where should I keep my medication? This medication is given in a hospital or clinic. It will not be stored at home. NOTE: This sheet is a summary. It may not cover all possible information. If you have questions about this medicine, talk to your doctor, pharmacist, or health care provider. Hypocalcemia, Adult Hypocalcemia is when the level of calcium in a person's blood is below normal. Calcium is a mineral that is used by the body in many ways. Not having enough blood calcium can affect the nervous system. This can lead to problems withmuscles, the heart, and the brain. What are the causes? This condition may be caused by: A deficiency of vitamin D or magnesium or both. Decreased levels of parathyroid hormone (hypoparathyroidism). Kidney function problems. Low levels of a body protein called albumin. Inflammation of the pancreas (pancreatitis). Not taking in enough vitamins and minerals in the diet or having intestinal problems that interfere  with nutrient absorption. Certain medicines. What are the signs or symptoms? Some people may not have any symptoms, especially if they have  long-term (chronic) hypocalcemia. Symptoms of this condition may include: Numbness and tingling in the fingers, toes, or around the mouth. Muscle twitching, aches, or cramps, especially in the legs, feet, and back. Spasm of the voice box (laryngospasm). This may make it difficult to breath or speak. Fast heartbeats (palpitations) and abnormal heart rhythms (arrhythmias). Shaking uncontrollably (seizures). Memory problems, confusion, or difficulty thinking. Depression, anxiety, irritability, or changes in personality. Long-term symptoms of this condition may include: Coarse, brittle hair and nails. Dry skin or lasting skin diseases (psoriasis, eczema, or dermatitis). Dental cavities. Clouding of the eye lens (cataracts). How is this diagnosed?  This condition is usually diagnosed with a blood test. You may also have other tests to help determine the underlying cause of the condition. This may includemore blood tests and imaging tests. How is this treated? This condition may be treated with: Calcium given by mouth (orally) or given through an IV. The method used for giving calcium will depend on the severity of the condition. If your condition is severe, you may need to be closely monitored in the hospital. Giving other minerals (electrolytes), such as magnesium. Other treatment will depend on the cause of the condition. Follow these instructions at home: Follow diet instructions from your health care provider or dietitian. Take supplements only as told by your health care provider. Keep all follow-up visits as told by your health care provider. This is important. Contact a health care provider if you: Have increased muscle twitching or cramps. Have new swelling in the feet, ankles, or legs. Develop changes in mood, memory, or personality. Get help right away if you: Have chest pain. Have persistent rapid or irregular heartbeats. Have difficulty breathing. Faint. Start to have  seizures. Have confusion. Summary Hypocalcemia is when the level of calcium in a person's blood is below normal. Not having enough blood calcium can affect the nervous system. This can lead to problems with muscles, the heart, and the brain. This condition may be treated with calcium given by mouth or through an IV, taking other minerals, and treating the underlying cause of hypocalcemia. Take supplements only as told by your health care provider. Contact a health care provider if you have new or worsening symptoms. Keep all follow-up visits as told by your health care provider. This is important. This information is not intended to replace advice given to you by your health care provider. Make sure you discuss any questions you have with your healthcare provider. Document Revised: 05/23/2018 Document Reviewed: 05/23/2018 Elsevier Patient Education  2022 [place].  Please pick up Potassium Chloride at [facility]. Start today and take 2 tablets today then follow directions on medication bottle.  Hypokalemia Hypokalemia means that the amount of potassium in the blood is lower than normal. Potassium is a chemical (electrolyte) that helps regulate the amount of fluid in the body. It also stimulates muscle tightening (contraction) and helps nerves work properly. Normally, most of the body's potassium is inside cells, and only a very small amount is in the blood. Because the amount in the blood is so small, minorchanges to potassium levels in the blood can be life-threatening. What are the causes? This condition may be caused by: Antibiotic medicine. Diarrhea or vomiting. Taking too much of a medicine that helps you have a bowel movement (laxative) can cause diarrhea and lead to hypokalemia. Chronic kidney  disease (CKD). Medicines that help the body get rid of excess fluid (diuretics). Eating disorders, such as bulimia. Low magnesium levels in the body. Sweating a lot. What are the  signs or symptoms? Symptoms of this condition include: Weakness. Constipation. Fatigue. Muscle cramps. Mental confusion. Skipped heartbeats or irregular heartbeat (palpitations). Tingling or numbness. How is this diagnosed? This condition is diagnosed with a blood test. How is this treated? This condition may be treated by: Taking potassium supplements by mouth. Adjusting the medicines that you take. Eating more foods that contain a lot of potassium. If your potassium level is very low, you may need to get potassium through anIV and be monitored in the hospital. Follow these instructions at home:  Take over-the-counter and prescription medicines only as told by your health care provider. This includes vitamins and supplements. Eat a healthy diet. A healthy diet includes fresh fruits and vegetables, whole grains, healthy fats, and lean proteins. If instructed, eat more foods that contain a lot of potassium. This includes: Nuts, such as peanuts and pistachios. Seeds, such as sunflower seeds and pumpkin seeds. Peas, lentils, and lima beans. Whole grain and bran cereals and breads. Fresh fruits and vegetables, such as apricots, avocado, bananas, cantaloupe, kiwi, oranges, tomatoes, asparagus, and potatoes. Orange juice. Tomato juice. Red meats. Yogurt. Keep all follow-up visits as told by your health care provider. This is important. Contact a health care provider if you: Have weakness that gets worse. Feel your heart pounding or racing. Vomit. Have diarrhea. Have diabetes (diabetes mellitus) and you have trouble keeping your blood sugar (glucose) in your target range. Get help right away if you: Have chest pain. Have shortness of breath. Have vomiting or diarrhea that lasts for more than 2 days. Faint. Summary Hypokalemia means that the amount of potassium in the blood is lower than normal. This condition is diagnosed with a blood test. Hypokalemia may be treated by taking  potassium supplements, adjusting the medicines that you take, or eating more foods that are high in potassium. If your potassium level is very low, you may need to get potassium through an IV and be monitored in the hospital. This information is not intended to replace advice given to you by your health care provider. Make sure you discuss any questions you have with your healthcare provider. Document Revised: 12/25/2017 Document Reviewed: 12/25/2017 Elsevier Patient Education  2022 [place].   2022 Elsevier/Gold Standard (2020-05-12 13:30:41)

## 2021-01-19 NOTE — Progress Notes (Unsigned)
L 

## 2021-01-19 NOTE — H&P (Signed)
History and Physical    ASUSENA SIGLEY BTD:[id number] DOB: [birth year] DOA: 01/19/2021  PCP: Jose Persia, MD   Patient coming from: Home  Chief Complaint: abnormal labs  HPI: [name] is a 70 y.o. female with medical history significant for HTN, DMT2, PAD, s/p amputation left lower leg over 15 yrs ago, polysubstance abuse, iron deficiency anemia who presents for evaluation of abnormal labs.  She was seen in Dr. Fritz Pickerel office earlier in the day and lab work was drawn which showed low magnesium and low potassium as well as low calcium level she was given calcium gluconate in the office per report from ER physician.  She was prescribed oral potassium and sent home.  She states when she took the oral potassium at home she had nausea and vomiting a short time later.  When her lab work came back to Dr. Fritz Pickerel office called and told her her magnesium level is low and she need to go to the emergency room.  In the emergency room labs confirmed low magnesium and potassium levels.  Patient reports she had an episode of chest pain that she felt like a bubble was moving around underneath her sternum 2 days ago but has not had any since then.  She denies any chest pain or palpitations when she had nausea and vomiting earlier today.  She reports has not had any rash, fever, cough, shortness of breath.  She smokes half a pack of cigarettes a day.  She smokes marijuana a few times a week and uses cocaine a few times a week.  She last used marijuana and cocaine 2 days ago she states.  ED Course: Ms. Due has stayed hemodynamically stable in the emergency room.  Lab work reveals WBC of 10,600 hemoglobin 15.1 hematocrit 44.8 platelets 331,000 sodium 144 potassium 2.8 chloride 101 bicarb 27 creatinine 1.20 from baseline of 1, BUN 12 glucose 134 alkaline phosphatase 67 AST 23 ALT 13 bilirubin 0.4.  Magnesium level 0.4.  Calcium 6.7.  Hospitalist service was asked to admit for further management  Review of  Systems:  General: Denies fever, chills, weight loss, night sweats.  Denies dizziness.  Denies change in appetite HENT: Denies head trauma, headache, denies change in hearing, tinnitus. Denies nasal congestion.  Denies sore throat, sores in mouth.  Denies difficulty swallowing Eyes: Denies blurry vision, pain in eye, drainage.  Denies discoloration of eyes. Neck: Denies pain.  Denies swelling.  Denies pain with movement. Cardiovascular: reports had chest pain 2 days ago but none now. Denies palpitations. Reports right leg edema.  Denies orthopnea Respiratory: Denies shortness of breath, cough.  Denies wheezing.  Denies sputum production Gastrointestinal: Denies abdominal pain, swelling.  Denies nausea, vomiting, diarrhea.  Denies melena.  Denies hematemesis. Musculoskeletal: Denies limitation of movement.  Denies deformity or swelling. Denies arthralgias or myalgias. Genitourinary: Denies pelvic pain.  Denies urinary frequency or hesitancy.  Denies dysuria.  Skin: Denies rash.  Denies petechiae, purpura, ecchymosis. Neurological: Denies syncope. Denies seizure activity. Denies paresthesia. Denies slurred speech, drooping face.  Denies visual change. Psychiatric: Denies depression, anxiety. Denies hallucinations.  Past Medical History:  Diagnosis Date   Allergic rhinitis    Anemia, iron deficiency 05/03/2011   2/2 GI AVMs - recieves iron infusions as well as periodic red blood cell transfusions   Anxiety    Arteriovenous malformation of duodenum 02/25/2015   CARPAL TUNNEL RELEASE, HX OF 07/22/2008   Chest pain 10/28/2019   Colon polyps    Diabetes mellitus without complication (  [place])    type 2   Difficulty sleeping    takes trazadone for sleep   Diverticulosis    Fall 01/03/2020   GERD (gastroesophageal reflux disease)    Hepatitis C    in SVR with Harvoni 12/2014-02/2015.    History of hernia repair 08/18/2012   History of porphyria 11/30/2014   Porphyria cutanea tarda in setting of chronic  Hepatitis C, frequent  IV iron infusions, and alcohol abuse    Hypertension    Hypocalcemia 10/28/2019   Hypokalemia 09/27/2014   Peripheral vascular disease ([place])    Stroke ([place]) 2010   no residual deficits   Substance use disorder    cocaine   Tachycardia 07/10/2017   Trichomonas vaginitis 04/16/2019    Past Surgical History:  Procedure Laterality Date   ABDOMINAL HYSTERECTOMY     APPLICATION OF WOUND VAC N/A 06/21/2014   Procedure: APPLICATION OF WOUND VAC;  Surgeon: Coralie Keens, MD;  Location: [place];  Service: General;  Laterality: N/A;   CARPAL TUNNEL RELEASE     rt hand   COLONOSCOPY WITH PROPOFOL N/A 03/17/2018   Procedure: COLONOSCOPY WITH PROPOFOL;  Surgeon: Doran Stabler, MD;  [facility]: [facility];  Service: Gastroenterology;  Laterality: N/A;   ENTEROSCOPY N/A 12/04/2012   Procedure: ENTEROSCOPY;  Surgeon: Beryle Beams, MD;  [facility]: [facility];  Service: Endoscopy;  Laterality: N/A;   ENTEROSCOPY N/A 12/18/2012   Procedure: ENTEROSCOPY;  Surgeon: Beryle Beams, MD;  [facility];  Service: Endoscopy;  Laterality: N/A;   ENTEROSCOPY N/A 02/25/2015   Procedure: ENTEROSCOPY;  Surgeon: Inda Castle, MD;  Location: [facility];  Service: Endoscopy;  Laterality: N/A;   ENTEROSCOPY N/A 10/29/2017   Procedure: ENTEROSCOPY;  Surgeon: Doran Stabler, MD;  [facility];  Service: Gastroenterology;  Laterality: N/A;   ENTEROSCOPY N/A 07/08/2020   Procedure: ENTEROSCOPY;  Surgeon: Milus Banister, MD;  [facility];  Service: Endoscopy;  Laterality: N/A;   ESOPHAGOGASTRODUODENOSCOPY  05/05/2011   Procedure: ESOPHAGOGASTRODUODENOSCOPY (EGD);  Surgeon: Zenovia Jarred, MD;  Location: [facility];  Service: Gastroenterology;  Laterality: N/A;  Dr. Hilarie Fredrickson will do procedure for Dr. Benson Norway Saturday.   ESOPHAGOGASTRODUODENOSCOPY  05/08/2011   Procedure: ESOPHAGOGASTRODUODENOSCOPY (EGD);  Surgeon: Beryle Beams;  [facility];  Service:  Endoscopy;  Laterality: N/A;   ESOPHAGOGASTRODUODENOSCOPY  06/07/2011   Procedure: ESOPHAGOGASTRODUODENOSCOPY (EGD);  Surgeon: Beryle Beams, MD;  Location: [facility];  Service: Endoscopy;  Laterality: N/A;   ESOPHAGOGASTRODUODENOSCOPY  12/20/2011   Procedure: ESOPHAGOGASTRODUODENOSCOPY (EGD);  Surgeon: Beryle Beams, MD;  Location: [facility];  Service: Endoscopy;  Laterality: N/A;   EYE SURGERY Bilateral    cataracts   FLEXIBLE SIGMOIDOSCOPY  12/21/2011   Procedure: FLEXIBLE SIGMOIDOSCOPY;  Surgeon: Beryle Beams, MD;  [facility];  Service: Endoscopy;  Laterality: N/A;   HOT HEMOSTASIS  06/07/2011   Procedure: HOT HEMOSTASIS (ARGON PLASMA COAGULATION/BICAP);  Surgeon: Beryle Beams, MD;  Location: [facility];  Service: Endoscopy;  Laterality: N/A;   HOT HEMOSTASIS N/A 07/08/2020   Procedure: HOT HEMOSTASIS (ARGON PLASMA COAGULATION/BICAP);  Surgeon: Milus Banister, MD;  Location: [facility];  Service: Endoscopy;  Laterality: N/A;   INCISIONAL HERNIA REPAIR N/A 06/01/2014   Procedure: ATTEMPTED LAPAROSCOPIC AND OPEN INCISIONAL HERNIA REPAIR WITH MESH;  Surgeon: Coralie Keens, MD;  Location: [place];  Service: General;  Laterality: N/A;   INSERTION OF MESH N/A 06/01/2014   Procedure: INSERTION OF MESH;  Surgeon: Coralie Keens, MD;  Location: [place];  Service: General;  Laterality: N/A;   LAPAROTOMY  02/16/2012   Procedure: EXPLORATORY LAPAROTOMY;  Surgeon: Edward Jolly, MD;  [facility];  Service: General;  Laterality: N/A;  oversewing of anastomotic leak and rigid sigmoidoscopy   LAPAROTOMY N/A 06/21/2014   Procedure: ABDOMINAL WOUND EXPLORATION;  Surgeon: Coralie Keens, MD;  Location: [place];  Service: General;  Laterality: N/A;   LEG AMPUTATION ABOVE KNEE     left   PARTIAL COLECTOMY  02/15/2012   Procedure: PARTIAL COLECTOMY;  Surgeon: Harl Bowie, MD;  [facility];  Service: General;  Laterality: N/A;   [place] INJECTION  07/08/2020    Procedure: SUBMUCOSAL INJECTION;  Surgeon: Milus Banister, MD;  [facility];  Service: Endoscopy;;   TONSILLECTOMY      Social History  reports that she has been smoking cigarettes. She has a 8.50 pack-year smoking history. She has never used smokeless tobacco. She reports current alcohol use. She reports current drug use. Drugs: Cocaine and Marijuana.  Allergies  Allergen Reactions   Ciprofloxacin Hives and Swelling   Morphine And Related Other (See Comments)    Makes pt sad and paranoid   Penicillins Hives and Swelling    Has patient had a PCN reaction causing immediate rash, facial/tongue/throat swelling, SOB or lightheadedness with hypotension:YES Has patient had a PCN reaction causing severe rash involving mucus membranes or skin necrosis:UNSURE Has patient had a PCN reaction that required hospitalization:YES Has patient had a PCN reaction occurring within the last 10 years:No If all of the above answers are "NO", then may proceed with Cephalosporin use.    Codeine Hives and Swelling   Lisinopril Cough    New onset dry cough after starting lisinopril. Resolved upon switching to losartan.   Feraheme [Ferumoxytol] Itching    Tolerated Feraheme 6/26 & 7/21 pre-medications prior to medication    Family History  Problem Relation Age of Onset   Cancer Father        unknown   Hypertension Mother    Diverticulitis Mother      Prior to Admission medications   Medication Sig Start Date End Date Taking? Authorizing Provider  ACCU-CHEK FASTCLIX LANCETS MISC Use to test blood glucose 3 times daily. 10/04/16   [name], MD  ACCU-CHEK GUIDE test strip USE TO TEST BLOOD SUGAR THREE TIMES A DAY 11/10/20   Sanjuan Dame, MD  amLODipine (NORVASC) 10 MG tablet Take 1 tablet (10 mg total) by mouth daily. 07/21/20   Jose Persia, MD  aspirin EC 81 MG tablet Take 1 tablet (81 mg total) by mouth daily. 03/04/15   Burgess Estelle, MD  Blood Glucose Monitoring Suppl  (ACCU-CHEK GUIDE) w/Device KIT 1 each by Does not apply route 3 (three) times daily. The patient is insulin requiring, ICD 10 code E11.65. The patient tests 3 times a day. 02/05/19   Jose Persia, MD  calcium-vitamin D (OSCAL WITH D) 500-200 MG-UNIT tablet Take 1 tablet by mouth 2 (two) times daily for 7 days. 01/17/21 01/24/21  Maudie Flakes, MD  cetirizine (ZYRTEC) 10 MG tablet Take 1 tablet (10 mg total) by mouth daily. 11/17/20   Harvie Heck, MD  Dulaglutide 0.75 MG/0.5ML SOPN INJECT 0.75 MG INTO THE SKIN ONCE A WEEK. Patient taking differently: Inject 0.75 mg into the skin once a week. Sunday 07/21/20 07/21/21  Jose Persia, MD  feeding supplement, GLUCERNA SHAKE, (GLUCERNA SHAKE) LIQD Take 237 mLs by mouth 3 (three) times daily between meals. Patient  taking differently: Take 237 mLs by mouth 2 (two) times daily between meals. 10/04/16   [name], MD  gabapentin (NEURONTIN) 400 MG capsule TAKE 1 CAPSULE BY MOUTH EVERY MORNING AND THEN TAKE 1 CAPSULE IN THE AFTERNOON; THEN 2 CAPSULES BY MOUTH BEFORE BEDTIME 11/30/20   Jose Persia, MD  losartan (COZAAR) 100 MG tablet TAKE 1 TABLET (100 MG TOTAL) BY MOUTH DAILY. Patient taking differently: Take 100 mg by mouth daily. 07/21/20 07/21/21  Jose Persia, MD  omeprazole (PRILOSEC) 20 MG capsule TAKE 1 CAPSULE BY MOUTH DAILY 11/30/20   Jose Persia, MD  potassium chloride SA (KLOR-CON) 20 MEQ tablet Take 1 tablet (20 mEq total) by mouth 2 (two) times daily for one week then once daily until finish 01/19/21   Truitt Merle, MD    Physical Exam: Vitals:   01/19/21 1951 01/19/21 2000 01/19/21 2030 01/19/21 2100  BP: (!) 160/94 (!) 150/88 (!) 148/87 140/88  Pulse: 93 89 86 79  Resp: _0 Temp:      TempSrc:      SpO2: 97% 93% 96% 93%    Constitutional: NAD, calm, comfortable Vitals:   01/19/21 1951 01/19/21 2000 01/19/21 2030 01/19/21 2100  BP: (!) 160/94 (!) 150/88 (!) 148/87 140/88  Pulse: 93 89 86 79  Resp: _1 Temp:       TempSrc:      SpO2: 97% 93% 96% 93%   General: WDWN, Alert and oriented x3.  Eyes: EOMI, PERRL, conjunctivae normal.  Sclera nonicteric HENT:  [place]/AT, external ears normal.  Nares patent without epistasis.  Mucous membranes are moist. Posterior pharynx clear   Neck: Soft, normal range of motion, supple, no masses, Trachea midline Respiratory: clear to auscultation bilaterally, no wheezing, no crackles. Normal respiratory effort. No accessory muscle use.  Cardiovascular: Regular rate and rhythm, no murmurs / rubs / gallops.  Mild right lower extremity edema. 1+ pedal pulses right foot.  Abdomen: Soft, no tenderness, nondistended, no rebound or guarding.  No masses palpated. Bowel sounds normoactive Musculoskeletal: FROM. no cyanosis. No joint deformity upper and lower extremities. no contractures. Normal muscle tone.  Left lower leg prosthesis in place Skin: Warm, dry, intact no rashes, lesions, ulcers. No induration Neurologic: CN 2-12 grossly intact.  Normal speech. Strength 5/5 in all extremities.   Psychiatric: Normal judgment and insight.  Normal mood.    Labs on Admission: I have personally reviewed following labs and imaging studies  CBC: Recent Labs  Lab 01/16/21 1600 01/16/21 1626 01/19/21 0856 01/19/21 1858  WBC 8.3  --  7.8 10.6*  NEUTROABS 7.6  --  5.5 8.4*  HGB 15.2* 16.0* 14.5 15.1*  HCT 45.0 47.0* 42.0 44.8  MCV 97.2  --  93.8 96.1  PLT 293  --  267 482    Basic Metabolic Panel: Recent Labs  Lab 01/16/21 1600 01/16/21 1626 01/19/21 0906 01/19/21 0953 01/19/21 1121 01/19/21 1858  NA 141 141  --  143 141 144  K 3.6 3.7  --  3.1* 3.0* 2.8*  CL 101 101  --  102 100 101  CO2 27  --   --  _2 GLUCOSE 139* 140*  --  140* 140* 134*  BUN 13 16  --  _3 CREATININE 1.01* 0.90  --  1.04* 1.00 1.20*  CALCIUM 5.8*  --   --  6.8* 6.7* 6.7*  MG  --   --  <0.5*  --   --  0.4*  PHOS  --   --   --  5.3*  --  5.6*    GFR: Estimated Creatinine  Clearance: 41.3 mL/min (A) (by C-G formula based on SCr of 1.2 mg/dL (H)).  Liver Function Tests: Recent Labs  Lab 01/16/21 1600 01/19/21 0953 01/19/21 1121  AST 23  --  23  ALT 12  --  13  ALKPHOS 67  --  67  BILITOT 0.5  --  0.4  PROT 6.7  --  6.6  ALBUMIN 3.2* 3.4* 3.2*    Urine analysis:    Component Value Date/Time   COLORURINE YELLOW 01/19/2021 1955   APPEARANCEUR HAZY (A) 01/19/2021 1955   APPEARANCEUR Clear 07/15/2019 1603   LABSPEC 1.010 01/19/2021 1955   PHURINE 6.0 01/19/2021 1955   GLUCOSEU NEGATIVE 01/19/2021 1955   HGBUR SMALL (A) 01/19/2021 1955   BILIRUBINUR NEGATIVE 01/19/2021 1955   BILIRUBINUR Negative 07/15/2019 1603   BILIRUBINUR Negative 07/15/2019 1509   KETONESUR 5 (A) 01/19/2021 1955   PROTEINUR >=300 (A) 01/19/2021 1955   UROBILINOGEN 0.2 07/15/2019 1509   UROBILINOGEN 1.0 10/24/2014 1555   NITRITE NEGATIVE 01/19/2021 1955   LEUKOCYTESUR NEGATIVE 01/19/2021 1955    Radiological Exams on Admission: No results found.  EKG: Independently reviewed.  EKG shows normal sinus rhythm with no acute ST elevation or depression.  QTC prolonged at 487  Assessment/Plan Principal Problem:   Hypokalemia Ms. Lennartz is admitted to Telemetry floor Potassium will be repleted once magnesium is replaced.  Recheck electrolytes in am.  Active Problems:   Hypomagnesemia Replete magnesium. Recheck level at 2300 tonight and then again in am. Will replete as necessary.     Hypocalcemia Given IV calcium in ER    Essential hypertension, benign Continue norvasc and Cozaar. Monitor BP    Type 2 diabetes mellitus with diabetic neuropathy  Monitor blood sugar with meals and bedtime. Corrective insulin provided as needed for glycemic control. Check HgbA1c    AKI (acute kidney injury) IVF hydration with LR at 75 ml/hr. Recheck renal function and electrolytes in am    Prolonged QT interval Avoid medications can further prolong QT interval    Above knee  amputation of left lower extremity  Has prosthesis    Tobacco use disorder Nicotine patch provided to prevent withdrawal.  Patient advised to discontinue tobacco use for health    Polysubstance abuse Patient will need social work consult to identify programs that may benefit patient     DVT prophylaxis: Lovenox for DVT prophylaxis.   Code Status:   Full Code  Family Communication:  Diagnosis and plan discussed with patient.  She verbalized understanding agrees with plan.  Further recommendations to follow as clinical indicated Disposition Plan:   Patient is from:  Home  Anticipated DC to:  Home  Anticipated DC date:  Anticipate 2 midnight stay in the hospital  Admission status:  Inpatient   Yevonne Aline Ester Mabe MD [facility]  How to contact the [facility] Attending or Consulting provider [place] or covering provider during after hours [place], for this patient?   Check the care team in [facility] and look for a) attending/consulting [facility] provider listed and b) the [facility] team listed Log into [email or web address] and use [place]'s universal password to access. If you do not have the password, please contact the hospital operator. Locate the [facility] provider you are looking for under [facility] and page to a number that you can be directly reached. If you still have difficulty  reaching the provider, please page the [facility] (Director on Call) for the Hospitalists listed on amion for assistance.  01/19/2021, 9:54 PM

## 2021-01-19 NOTE — Progress Notes (Signed)
Internal Medicine Clinic Attending  I saw and evaluated the patient.  I personally confirmed the key portions of the history and exam documented by Dr.  Dean  and I reviewed pertinent patient test results.  The assessment, diagnosis, and plan were formulated together and I agree with the documentation in the resident's note.  

## 2021-01-19 NOTE — ED Notes (Signed)
Pt given crackers, juice, and cheese.

## 2021-01-20 DIAGNOSIS — N179 Acute kidney failure, unspecified: Secondary | ICD-10-CM

## 2021-01-20 DIAGNOSIS — F1910 Other psychoactive substance abuse, uncomplicated: Secondary | ICD-10-CM

## 2021-01-20 DIAGNOSIS — I10 Essential (primary) hypertension: Secondary | ICD-10-CM

## 2021-01-20 LAB — COMPREHENSIVE METABOLIC PANEL
ALT: 13 U/L (ref 0–44)
AST: 23 U/L (ref 15–41)
Albumin: 3.1 g/dL — ABNORMAL LOW (ref 3.5–5.0)
Alkaline Phosphatase: 51 U/L (ref 38–126)
Anion gap: 8 (ref 5–15)
BUN: 10 mg/dL (ref 8–23)
CO2: 26 mmol/L (ref 22–32)
Calcium: 6.2 mg/dL — CL (ref 8.9–10.3)
Chloride: 106 mmol/L (ref 98–111)
Creatinine, Ser: 0.95 mg/dL (ref 0.44–1.00)
GFR, Estimated: >60 mL/min (ref >60)
Glucose, Bld: 178 mg/dL — ABNORMAL HIGH (ref 70–99)
Potassium: 3.5 mmol/L (ref 3.5–5.1)
Sodium: 140 mmol/L (ref 135–145)
Total Bilirubin: 0.5 mg/dL (ref 0.3–1.2)
Total Protein: 6 g/dL — ABNORMAL LOW (ref 6.5–8.1)

## 2021-01-20 LAB — GLUCOSE, CAPILLARY
Glucose-Capillary: 133 mg/dL — ABNORMAL HIGH (ref 70–99)
Glucose-Capillary: 110 mg/dL — ABNORMAL HIGH (ref 70–99)
Glucose-Capillary: 124 mg/dL — ABNORMAL HIGH (ref 70–99)

## 2021-01-20 LAB — CBC
HCT: 39.9 % (ref 36.0–46.0)
Hemoglobin: 13.3 g/dL (ref 12.0–15.0)
MCH: 32.4 pg (ref 26.0–34.0)
MCHC: 33.3 g/dL (ref 30.0–36.0)
MCV: 97.3 fL (ref 80.0–100.0)
Platelets: 256 10*3/uL (ref 150–400)
RBC: 4.1 MIL/uL (ref 3.87–5.11)
RDW: 12.4 % (ref 11.5–15.5)
WBC: 9.5 10*3/uL (ref 4.0–10.5)
nRBC: 0 % (ref 0.0–0.2)

## 2021-01-20 LAB — CBG MONITORING, ED: Glucose-Capillary: 105 mg/dL — ABNORMAL HIGH (ref 70–99)

## 2021-01-20 LAB — PTH, INTACT AND CALCIUM
Calcium, Total (PTH): 6.6 mg/dL — CL (ref 8.7–10.3)
PTH: 28 pg/mL (ref 15–65)

## 2021-01-20 LAB — MAGNESIUM: Magnesium: 1.4 mg/dL — ABNORMAL LOW (ref 1.7–2.4)

## 2021-01-20 LAB — HEMOGLOBIN A1C
Hgb A1c MFr Bld: 5.3 % (ref 4.8–5.6)
Mean Plasma Glucose: 105.41 mg/dL

## 2021-01-20 LAB — SARS CORONAVIRUS 2 (TAT 6-24 HRS): SARS Coronavirus 2: NEGATIVE

## 2021-01-20 MED ORDER — CALCIUM GLUCONATE-NACL 1-0.675 GM/50ML-% IV SOLN
1.0000 g | Freq: Once | INTRAVENOUS | Status: AC
  Administered 2021-01-20: 1000 mg via INTRAVENOUS
  Filled 2021-01-20: qty 50

## 2021-01-20 MED ORDER — CALCIUM CARBONATE ANTACID 500 MG PO CHEW: 1.0000 | CHEWABLE_TABLET | Freq: Three times a day (TID) | ORAL | Status: DC

## 2021-01-20 MED ORDER — POTASSIUM CHLORIDE CRYS ER 20 MEQ PO TBCR
40.0000 meq | EXTENDED_RELEASE_TABLET | Freq: Once | ORAL | Status: AC
  Administered 2021-01-20: 40 meq via ORAL
  Filled 2021-01-20: qty 2

## 2021-01-20 MED ORDER — MAGNESIUM SULFATE 4 GM/100ML IV SOLN
4.0000 g | Freq: Once | INTRAVENOUS | Status: AC
  Administered 2021-01-20: 4 g via INTRAVENOUS
  Filled 2021-01-20 (×2): qty 100

## 2021-01-20 NOTE — Progress Notes (Signed)
Patient ID: [name], female   DOB: 1950-09-05, 69 y.o.   MRN: [id number]  PROGRESS NOTE    TRIESTE WOOTERS  [id number] DOB: [birth year] DOA: 01/19/2021 PCP: Jose Persia, MD   Brief Narrative:  70 year old female with history of hypertension, diabetes mellitus type 2, PAD, status post amputation of left lower leg over 15 years ago, polysubstance abuse, iron deficiency anemia presented with abnormal labs.  She was seen in Dr. Ernestina Penna office on 01/19/2021 and was found to have low magnesium/potassium and calcium for which she was given IV calcium gluconate and referred to the ED.  On presentation, magnesium was 0.4; calcium was 6.7; potassium was 2.8.  She was started on supplementation.  Assessment & Plan:   Severe electrolyte abnormalities including hypokalemia/hypomagnesemia/hypocalcemia -Magnesium improving but still 1.4 today.  Continue replacement.  Continue replacing potassium and calcium as well. -Repeat a.m. labs  Acute kidney injury--improved with hydration.  DC IV fluids.  Encourage oral intake  Essential hypertension--blood pressure intermittently elevated.  Continue amlodipine and losartan  Diabetes mellitus type 2 with diabetic neuropathy -Continue CBGs with SSI.  Continue gabapentin  Prolonged QT interval -Avoid QT prolonging medication  Acute hypoxic respiratory failure -Questionable cause.  Currently on 3 L oxygen via nasal cannula.  Will wean off as able.  Generalized conditioning -PT eval  Tobacco abuse  -Continue nicotine patch.  Patient was counseled regarding tobacco cessation by admitting hospitalist  History of polysubstance abuse  DVT prophylaxis: Lovenox Code Status: Full Family Communication: None at bedside Disposition Plan: Status is: Inpatient  Remains inpatient appropriate because:Inpatient level of care appropriate due to severity of illness  Dispo: The patient is from: Home              Anticipated d/c is to: Home               Patient currently is not medically stable to d/c.   Difficult to place patient No   Consultants: None  Procedures: None  Antimicrobials: None   Subjective: Patient seen and examined at bedside.  Denies worsening shortness breath, nausea, vomiting.  Objective: Vitals:   01/20/21 0715 01/20/21 0930 01/20/21 1000 01/20/21 1110  BP:  (!) 165/83  (!) 154/87  Pulse: 72 71  87  Resp: '19 14  17  '$ Temp:   98.2 F (36.8 C) 97.7 F (36.5 C)  TempSrc:   Oral Oral  SpO2: 92% 96%  97%   No intake or output data in the 24 hours ending 01/20/21 1152 There were no vitals filed for this visit.  Examination:  General exam: Appears calm and comfortable.  Looks chronically ill and deconditioned.  Currently on 3 L oxygen by nasal cannula Respiratory system: Bilateral decreased breath sounds at bases with some scattered crackles Cardiovascular system: S1 & S2 heard, Rate controlled Gastrointestinal system: Abdomen is nondistended, soft and nontender. Normal bowel sounds heard. Extremities: No cyanosis, clubbing; left lower extremity prosthesis present  Central nervous system: Alert and oriented. No focal neurological deficits. Moving extremities Skin: No rashes, lesions or ulcers Psychiatry: Flat affect.  Intermittently gets angry.  Does not participate in conversation much.   Data Reviewed: I have personally reviewed following labs and imaging studies  CBC: Recent Labs  Lab 01/16/21 1600 01/16/21 1626 01/19/21 0856 01/19/21 1858 01/20/21 0450  WBC 8.3  --  7.8 10.6* 9.5  NEUTROABS 7.6  --  5.5 8.4*  --   HGB 15.2* 16.0* 14.5 15.1* 13.3  HCT 45.0 47.0* 42.0 44.8 39.9  MCV 97.2  --  93.8 96.1 97.3  PLT 293  --  267 331 [id number]   Basic Metabolic Panel: Recent Labs  Lab 01/16/21 1600 01/16/21 1626 01/19/21 0906 01/19/21 0953 01/19/21 1121 01/19/21 1858 01/19/21 2320 01/20/21 0450  NA 141 141  --  143 141 144  --  140  K 3.6 3.7  --  3.1* 3.0* 2.8*  --  3.5  CL 101 101  --  102  100 101  --  106  CO2 27  --   --  '29 29 27  '$ --  26  GLUCOSE 139* 140*  --  140* 140* 134*  --  178*  BUN 13 16  --  '13 11 12  '$ --  10  CREATININE 1.01* 0.90  --  1.04* 1.00 1.20*  --  0.95  CALCIUM 5.8*  --  6.6* 6.8* 6.7* 6.7*  --  6.2*  MG  --   --  <0.5*  --   --  0.4* 1.9 1.4*  PHOS  --   --   --  5.3*  --  5.6*  --   --    GFR: Estimated Creatinine Clearance: 52.2 mL/min (by C-G formula based on SCr of 0.95 mg/dL). Liver Function Tests: Recent Labs  Lab 01/16/21 1600 01/19/21 0953 01/19/21 1121 01/20/21 0450  AST 23  --  23 23  ALT 12  --  13 13  ALKPHOS 67  --  67 51  BILITOT 0.5  --  0.4 0.5  PROT 6.7  --  6.6 6.0*  ALBUMIN 3.2* 3.4* 3.2* 3.1*   No results for input(s): LIPASE, AMYLASE in the last 168 hours. No results for input(s): AMMONIA in the last 168 hours. Coagulation Profile: Recent Labs  Lab 01/16/21 1600  INR 1.0   Cardiac Enzymes: No results for input(s): CKTOTAL, CKMB, CKMBINDEX, TROPONINI in the last 168 hours. BNP (last 3 results) No results for input(s): PROBNP in the last 8760 hours. HbA1C: Recent Labs    01/19/21 2320  HGBA1C 5.3   CBG: Recent Labs  Lab 01/16/21 1918 01/19/21 2315 01/20/21 0829 01/20/21 1117  GLUCAP 218* 96 105* 133*   Lipid Profile: No results for input(s): CHOL, HDL, LDLCALC, TRIG, CHOLHDL, LDLDIRECT in the last 72 hours. Thyroid Function Tests: No results for input(s): TSH, T4TOTAL, FREET4, T3FREE, THYROIDAB in the last 72 hours. Anemia Panel: Recent Labs    01/19/21 0857  FERRITIN 629*  TIBC 221*  IRON 99   Sepsis Labs: No results for input(s): PROCALCITON, LATICACIDVEN in the last 168 hours.  Recent Results (from the past 240 hour(s))  SARS CORONAVIRUS 2 (TAT 6-24 HRS) Nasopharyngeal Nasopharyngeal Swab     Status: None   Collection Time: 01/19/21  9:35 PM   Specimen: Nasopharyngeal Swab  Result Value Ref Range Status   SARS Coronavirus 2 NEGATIVE NEGATIVE Final    Comment: (NOTE) SARS-CoV-2 target  nucleic acids are NOT DETECTED.  The SARS-CoV-2 RNA is generally detectable in upper and lower respiratory specimens during the acute phase of infection. Negative results do not preclude SARS-CoV-2 infection, do not rule out co-infections with other pathogens, and should not be used as the sole basis for treatment or other patient management decisions. Negative results must be combined with clinical observations, patient history, and epidemiological information. The expected result is Negative.  Fact Sheet for Patients: SugarRoll.be  Fact Sheet for Healthcare Providers: https://www.woods-mathews.com/  This test is not yet approved or cleared by the [place] FDA  and  has been authorized for detection and/or diagnosis of SARS-CoV-2 by FDA under an Emergency Use Authorization (EUA). This EUA will remain  in effect (meaning this test can be used) for the duration of the COVID-19 declaration under Se ction 564(b)(1) of the Act, 21 U.S.C. section 360bbb-3(b)(1), unless the authorization is terminated or revoked sooner.  Performed at [facility], [place] [address], [place], [place] [address]          Radiology Studies: DG CHEST PORT 1 VIEW  Result Date: 01/19/2021 CLINICAL DATA:  Dyspnea, weakness EXAM: PORTABLE CHEST 1 VIEW COMPARISON:  10/28/2019 FINDINGS: Lung volumes are small and there is left basilar atelectasis. No pneumothorax or pleural effusion. Cardiac size within normal limits. Pulmonary vascularity is normal. No acute bone abnormality. IMPRESSION: Pulmonary hypoinflation. Electronically Signed   By: Fidela Salisbury M.D.   On: 01/19/2021 22:43        Scheduled Meds:  amLODipine  10 mg Oral Daily   aspirin EC  81 mg Oral Daily   enoxaparin (LOVENOX) injection  40 mg Subcutaneous Q24H   gabapentin  400 mg Oral 2 times per day   gabapentin  800 mg Oral QHS   insulin aspart  0-5 Units Subcutaneous QHS   insulin aspart   0-9 Units Subcutaneous TID WC   losartan  100 mg Oral Daily   nicotine  7 mg Transdermal Daily   Continuous Infusions:  lactated ringers 75 mL/hr at 01/19/21 2323   magnesium sulfate bolus IVPB            Aline August, MD [facility] 01/20/2021, 11:52 AM

## 2021-01-20 NOTE — ED Notes (Signed)
Placed pt on 3L [place] due to oxygen saturation consistently in the 80s.

## 2021-01-20 NOTE — ED Notes (Signed)
Pt. Documented in error see above note in chart. 

## 2021-01-21 DIAGNOSIS — R9431 Abnormal electrocardiogram [ECG] [EKG]: Secondary | ICD-10-CM

## 2021-01-21 LAB — MAGNESIUM: Magnesium: 2.1 mg/dL (ref 1.7–2.4)

## 2021-01-21 LAB — BASIC METABOLIC PANEL
Anion gap: 8 (ref 5–15)
BUN: 12 mg/dL (ref 8–23)
CO2: 27 mmol/L (ref 22–32)
Calcium: 7.5 mg/dL — ABNORMAL LOW (ref 8.9–10.3)
Chloride: 106 mmol/L (ref 98–111)
Creatinine, Ser: 0.93 mg/dL (ref 0.44–1.00)
GFR, Estimated: >60 mL/min (ref >60)
Glucose, Bld: 104 mg/dL — ABNORMAL HIGH (ref 70–99)
Potassium: 4 mmol/L (ref 3.5–5.1)
Sodium: 141 mmol/L (ref 135–145)

## 2021-01-21 LAB — GLUCOSE, CAPILLARY: Glucose-Capillary: 145 mg/dL — ABNORMAL HIGH (ref 70–99)

## 2021-01-21 MED ORDER — CALCIUM CARBONATE-VITAMIN D 500-200 MG-UNIT PO TABS: 1.0000 | ORAL_TABLET | Freq: Two times a day (BID) | ORAL | 0 refills | Status: AC

## 2021-01-21 NOTE — Discharge Summary (Signed)
Physician Discharge Summary  [name] BCW:[id number] DOB: 08-Mar-1951 DOA: 01/19/2021  PCP: Jose Persia, MD  Admit date: 01/19/2021 Discharge date: 01/21/2021  Admitted From: Home Disposition: Home  Recommendations for Outpatient Follow-up:  Follow up with PCP in 1 week with repeat CBC/BMP Patient might need nephrology evaluation and follow-up if continues to have persistent hypokalemia/hypomagnesemia Comply with medications and follow-up.  Abstain from illicit drug use including cocaine Follow up in ED if symptoms worsen or new appear   Home Health: No Equipment/Devices: None  Discharge Condition: Stable CODE STATUS: Full Diet recommendation: Heart healthy/carb modified  Brief/Interim Summary: 70 year old female with history of hypertension, diabetes mellitus type 2, PAD, status post amputation of left lower leg over 15 years ago, polysubstance abuse, iron deficiency anemia presented with abnormal labs.  She was seen in Dr. Ernestina Penna office on 01/19/2021 and was found to have low magnesium/potassium and calcium for which she was given IV calcium gluconate and referred to the ED.  On presentation, magnesium was 0.4; calcium was 6.7; potassium was 2.8.  She was started on supplementation.  During the hospitalizing, her electrolytes have much improved.  She feels better and wants to go home today.  She will be discharged home today with outpatient follow-up with PCP.  Discharge Diagnoses:   Severe electrolyte abnormalities including hypokalemia/hypomagnesemia/hypocalcemia -Replaced.  Potassium and magnesium have normalized. -Calcium improving.  Continue replacement upon discharge.  Outpatient follow-up of labs and follow-up with PCP within a week -Discharge patient home today.  Acute kidney injury--improved with hydration.  Off IV fluids.  Outpatient follow-up.  Essential hypertension--blood pressure intermittently elevated.  Continue amlodipine and losartan.  Outpatient  follow-up   Diabetes mellitus type 2 with diabetic neuropathy -Continue carb modified diet.  Outpatient follow-up.  Continue metformin.  Continue gabapentin  Prolonged QT interval -Avoid QT prolonging medication.  Outpatient follow-up  Acute hypoxic respiratory failure -Questionable cause.  Resolved.  Currently on room air.  Tobacco abuse  -Continue nicotine patch.  Patient was counseled regarding tobacco cessation by admitting hospitalist   History of polysubstance abuse -Urine drug screen positive for cocaine and tetrahydrocannabinol.  Counseled regarding cessation.   Discharge Instructions  Discharge Instructions     Diet - low sodium heart healthy   Complete by: As directed    Diet Carb Modified   Complete by: As directed    Increase activity slowly   Complete by: As directed       Allergies as of 01/21/2021       Reactions   Ciprofloxacin Hives, Swelling   Morphine And Related Other (See Comments)   Makes pt sad and paranoid   Penicillins Hives, Swelling   Has patient had a PCN reaction causing immediate rash, facial/tongue/throat swelling, SOB or lightheadedness with hypotension:YES Has patient had a PCN reaction causing severe rash involving mucus membranes or skin necrosis:UNSURE Has patient had a PCN reaction that required hospitalization:YES Has patient had a PCN reaction occurring within the last 10 years:No If all of the above answers are "NO", then may proceed with Cephalosporin use.   Codeine Hives, Swelling   Lisinopril Cough   New onset dry cough after starting lisinopril. Resolved upon switching to losartan.   Feraheme [ferumoxytol] Itching   Tolerated Feraheme 6/26 & 7/21 pre-medications prior to medication        Medication List     STOP taking these medications    feeding supplement (GLUCERNA SHAKE) Liqd       TAKE these medications    Accu-Chek  FastClix Lancets Misc Use to test blood glucose 3 times daily.   Accu-Chek Guide test  strip Generic drug: glucose blood USE TO TEST BLOOD SUGAR THREE TIMES A DAY   Accu-Chek Guide w/Device Kit 1 each by Does not apply route 3 (three) times daily. The patient is insulin requiring, ICD 10 code E11.65. The patient tests 3 times a day.   amLODipine 10 MG tablet Commonly known as: NORVASC Take 1 tablet (10 mg total) by mouth daily.   aspirin EC 81 MG tablet Take 1 tablet (81 mg total) by mouth daily.   calcium-vitamin D 500-200 MG-UNIT tablet Commonly known as: OSCAL WITH D Take 1 tablet by mouth 2 (two) times daily.   cetirizine 10 MG tablet Commonly known as: ZYRTEC Take 1 tablet (10 mg total) by mouth daily.   colchicine 0.6 MG tablet Take 0.6 mg by mouth daily as needed.   gabapentin 400 MG capsule Commonly known as: NEURONTIN TAKE 1 CAPSULE BY MOUTH EVERY MORNING AND THEN TAKE 1 CAPSULE IN THE AFTERNOON; THEN 2 CAPSULES BY MOUTH BEFORE BEDTIME   losartan 100 MG tablet Commonly known as: COZAAR TAKE 1 TABLET (100 MG TOTAL) BY MOUTH DAILY. What changed: how much to take   metFORMIN 1000 MG tablet Commonly known as: GLUCOPHAGE Take 1,000 mg by mouth 2 (two) times daily.   omeprazole 20 MG capsule Commonly known as: PRILOSEC TAKE 1 CAPSULE BY MOUTH DAILY   potassium chloride SA 20 MEQ tablet Commonly known as: KLOR-CON Take 1 tablet (20 mEq total) by mouth 2 (two) times daily for one week then once daily until finish   Trulicity 9.92 EQ/6.8TM Sopn Generic drug: Dulaglutide INJECT 0.75 MG INTO THE SKIN ONCE A WEEK. What changed: additional instructions        Follow-up Information     Jose Persia, MD. Schedule an appointment as soon as possible for a visit in 1 week(s).   Specialty: Internal Medicine Why: with repeat bmp/magnesium Contact information: [address] [place] [address] [phone]                Allergies  Allergen Reactions   Ciprofloxacin Hives and Swelling   Morphine And Related Other (See  Comments)    Makes pt sad and paranoid   Penicillins Hives and Swelling    Has patient had a PCN reaction causing immediate rash, facial/tongue/throat swelling, SOB or lightheadedness with hypotension:YES Has patient had a PCN reaction causing severe rash involving mucus membranes or skin necrosis:UNSURE Has patient had a PCN reaction that required hospitalization:YES Has patient had a PCN reaction occurring within the last 10 years:No If all of the above answers are "NO", then may proceed with Cephalosporin use.    Codeine Hives and Swelling   Lisinopril Cough    New onset dry cough after starting lisinopril. Resolved upon switching to losartan.   Feraheme [Ferumoxytol] Itching    Tolerated Feraheme 6/26 & 7/21 pre-medications prior to medication    Consultations:    Procedures/Studies: CT HEAD WO CONTRAST  Result Date: 01/16/2021 CLINICAL DATA:  Mental status change, unknown cause EXAM: CT HEAD WITHOUT CONTRAST TECHNIQUE: Contiguous axial images were obtained from the base of the skull through the vertex without intravenous contrast. COMPARISON:  Oct 13, 2020. FINDINGS: Brain: No evidence of acute infarction, hemorrhage, hydrocephalus, extra-axial collection or mass lesion/mass effect. Mild patchy white matter hypoattenuation, nonspecific but likely related to chronic microvascular ischemic disease. Vascular: No hyperdense vessel identified. Calcific intracranial atherosclerosis. Skull:  No acute fracture. Sinuses/Orbits: Visualized sinuses are largely clear. No acute orbital findings. Other: No mastoid effusions. IMPRESSION: 1. No evidence of acute intracranial abnormality. 2. Mild chronic microvascular ischemic disease. Electronically Signed   By: Margaretha Sheffield M.D.   On: 01/16/2021 17:39   MR BRAIN WO CONTRAST  Result Date: 01/17/2021 CLINICAL DATA:  Initial evaluation for neuro deficit, stroke suspected. EXAM: MRI HEAD WITHOUT CONTRAST TECHNIQUE: Multiplanar, multiecho pulse  sequences of the brain and surrounding structures were obtained without intravenous contrast. COMPARISON:  CT from 01/16/2021 as well as MRI from 10/13/2020. FINDINGS: Brain: Cerebral volume within normal limits for age. Mild chronic microvascular ischemic disease noted involving the periventricular and deep white matter both cerebral hemispheres. Probable tiny remote right cerebellar infarct noted. No abnormal foci of restricted diffusion to suggest acute or subacute ischemia. Gray-white matter differentiation maintained. No encephalomalacia to suggest chronic cortical infarction. No acute intracranial hemorrhage. Few punctate chronic micro hemorrhages noted within the right cerebral hemisphere, nonspecific. No mass lesion, midline shift or mass effect. No hydrocephalus or extra-axial fluid collection. Pituitary gland suprasellar region normal. Midline structures intact and normal. Vascular: Major intracranial vascular flow voids are maintained. Skull and upper cervical spine: Craniocervical junction within normal limits. Visualized bone marrow signal intensity diffusely heterogeneous and decreased on T1 weighted imaging, nonspecific, but most commonly related to anemia, smoking, or obesity. No definite focal marrow replacing lesion. No scalp soft tissue abnormality. Sinuses/Orbits: Prior bilateral ocular lens replacement. Paranasal sinuses are largely clear. Trace bilateral mastoid effusions noted, of doubtful significance. Inner ear structures grossly normal. Other: None. IMPRESSION: 1. No acute intracranial abnormality. 2. Mild chronic microvascular ischemic disease for age, with a tiny remote right cerebellar infarct. Electronically Signed   By: Jeannine Boga M.D.   On: 01/17/2021 03:17   DG CHEST PORT 1 VIEW  Result Date: 01/19/2021 CLINICAL DATA:  Dyspnea, weakness EXAM: PORTABLE CHEST 1 VIEW COMPARISON:  10/28/2019 FINDINGS: Lung volumes are small and there is left basilar atelectasis. No  pneumothorax or pleural effusion. Cardiac size within normal limits. Pulmonary vascularity is normal. No acute bone abnormality. IMPRESSION: Pulmonary hypoinflation. Electronically Signed   By: Fidela Salisbury M.D.   On: 01/19/2021 22:43      Subjective: Patient seen and examined at bedside.  Feels much better.  Wants to go home today.  No overnight fever, vomiting reported.  Discharge Exam: Vitals:   01/20/21 2322 01/21/21 0538  BP: (!) 169/86 (!) 145/81  Pulse: 87 81  Resp: 20 20  Temp: 98.7 F (37.1 C) 98.6 F (37 C)  SpO2: 96% 96%    General: Pt is alert, awake, not in acute distress.  Looks chronically ill and deconditioned. Cardiovascular: rate controlled, S1/S2 + Respiratory: bilateral decreased breath sounds at bases Abdominal: Soft, obese, NT, ND, bowel sounds + Extremities: no edema, no cyanosis; left lower extremity prosthesis present    The results of significant diagnostics from this hospitalization (including imaging, microbiology, ancillary and laboratory) are listed below for reference.     Microbiology: Recent Results (from the past 240 hour(s))  SARS CORONAVIRUS 2 (TAT 6-24 HRS) Nasopharyngeal Nasopharyngeal Swab     Status: None   Collection Time: 01/19/21  9:35 PM   Specimen: Nasopharyngeal Swab  Result Value Ref Range Status   SARS Coronavirus 2 NEGATIVE NEGATIVE Final    Comment: (NOTE) SARS-CoV-2 target nucleic acids are NOT DETECTED.  The SARS-CoV-2 RNA is generally detectable in upper and lower respiratory specimens during the acute phase of infection. Negative  results do not preclude SARS-CoV-2 infection, do not rule out co-infections with other pathogens, and should not be used as the sole basis for treatment or other patient management decisions. Negative results must be combined with clinical observations, patient history, and epidemiological information. The expected result is Negative.  Fact Sheet for  Patients: SugarRoll.be  Fact Sheet for Healthcare Providers: https://www.woods-mathews.com/  This test is not yet approved or cleared by the [place] FDA and  has been authorized for detection and/or diagnosis of SARS-CoV-2 by FDA under an Emergency Use Authorization (EUA). This EUA will remain  in effect (meaning this test can be used) for the duration of the COVID-19 declaration under Se ction 564(b)(1) of the Act, 21 U.S.C. section 360bbb-3(b)(1), unless the authorization is terminated or revoked sooner.  Performed at [facility], [place] [address], [place], [place] [address]      Labs: BNP (last 3 results) No results for input(s): BNP in the last 8760 hours. Basic Metabolic Panel: Recent Labs  Lab 01/19/21 0906 01/19/21 0953 01/19/21 1121 01/19/21 1858 01/19/21 2320 01/20/21 0450 01/21/21 0633  NA  --  143 141 144  --  140 141  K  --  3.1* 3.0* 2.8*  --  3.5 4.0  CL  --  102 100 101  --  106 106  CO2  --  $R'29 29 27  'EA$ --  26 27  GLUCOSE  --  140* 140* 134*  --  178* 104*  BUN  --  $R'13 11 12  'Cc$ --  10 12  CREATININE  --  1.04* 1.00 1.20*  --  0.95 0.93  CALCIUM 6.6* 6.8* 6.7* 6.7*  --  6.2* 7.5*  MG <0.5*  --   --  0.4* 1.9 1.4* 2.1  PHOS  --  5.3*  --  5.6*  --   --   --    Liver Function Tests: Recent Labs  Lab 01/16/21 1600 01/19/21 0953 01/19/21 1121 01/20/21 0450  AST 23  --  23 23  ALT 12  --  13 13  ALKPHOS 67  --  67 51  BILITOT 0.5  --  0.4 0.5  PROT 6.7  --  6.6 6.0*  ALBUMIN 3.2* 3.4* 3.2* 3.1*   No results for input(s): LIPASE, AMYLASE in the last 168 hours. No results for input(s): AMMONIA in the last 168 hours. CBC: Recent Labs  Lab 01/16/21 1600 01/16/21 1626 01/19/21 0856 01/19/21 1858 01/20/21 0450  WBC 8.3  --  7.8 10.6* 9.5  NEUTROABS 7.6  --  5.5 8.4*  --   HGB 15.2* 16.0* 14.5 15.1* 13.3  HCT 45.0 47.0* 42.0 44.8 39.9  MCV 97.2  --  93.8 96.1 97.3  PLT 293  --  267 331 256    Cardiac Enzymes: No results for input(s): CKTOTAL, CKMB, CKMBINDEX, TROPONINI in the last 168 hours. BNP: Invalid input(s): POCBNP CBG: Recent Labs  Lab 01/20/21 0829 01/20/21 1117 01/20/21 1654 01/20/21 2132 01/21/21 0744  GLUCAP 105* 133* 110* 124* 145*   D-Dimer No results for input(s): DDIMER in the last 72 hours. Hgb A1c Recent Labs    01/19/21 2320  HGBA1C 5.3   Lipid Profile No results for input(s): CHOL, HDL, LDLCALC, TRIG, CHOLHDL, LDLDIRECT in the last 72 hours. Thyroid function studies No results for input(s): TSH, T4TOTAL, T3FREE, THYROIDAB in the last 72 hours.  Invalid input(s): FREET3 Anemia work up Recent Labs    01/19/21 0857  FERRITIN 629*  TIBC 221*  IRON 99   Urinalysis    Component Value Date/Time   COLORURINE YELLOW 01/19/2021 1955   APPEARANCEUR HAZY (A) 01/19/2021 1955   APPEARANCEUR Clear 07/15/2019 1603   LABSPEC 1.010 01/19/2021 1955   PHURINE 6.0 01/19/2021 1955   GLUCOSEU NEGATIVE 01/19/2021 1955   HGBUR SMALL (A) 01/19/2021 1955   BILIRUBINUR NEGATIVE 01/19/2021 1955   BILIRUBINUR Negative 07/15/2019 1603   BILIRUBINUR Negative 07/15/2019 1509   KETONESUR 5 (A) 01/19/2021 1955   PROTEINUR >=300 (A) 01/19/2021 1955   UROBILINOGEN 0.2 07/15/2019 1509   UROBILINOGEN 1.0 10/24/2014 1555   NITRITE NEGATIVE 01/19/2021 1955   LEUKOCYTESUR NEGATIVE 01/19/2021 1955   Sepsis Labs Invalid input(s): PROCALCITONIN,  WBC,  LACTICIDVEN Microbiology Recent Results (from the past 240 hour(s))  SARS CORONAVIRUS 2 (TAT 6-24 HRS) Nasopharyngeal Nasopharyngeal Swab     Status: None   Collection Time: 01/19/21  9:35 PM   Specimen: Nasopharyngeal Swab  Result Value Ref Range Status   SARS Coronavirus 2 NEGATIVE NEGATIVE Final    Comment: (NOTE) SARS-CoV-2 target nucleic acids are NOT DETECTED.  The SARS-CoV-2 RNA is generally detectable in upper and lower respiratory specimens during the acute phase of infection. Negative results do not  preclude SARS-CoV-2 infection, do not rule out co-infections with other pathogens, and should not be used as the sole basis for treatment or other patient management decisions. Negative results must be combined with clinical observations, patient history, and epidemiological information. The expected result is Negative.  Fact Sheet for Patients: SugarRoll.be  Fact Sheet for Healthcare Providers: https://www.woods-mathews.com/  This test is not yet approved or cleared by the [place] FDA and  has been authorized for detection and/or diagnosis of SARS-CoV-2 by FDA under an Emergency Use Authorization (EUA). This EUA will remain  in effect (meaning this test can be used) for the duration of the COVID-19 declaration under Se ction 564(b)(1) of the Act, 21 U.S.C. section 360bbb-3(b)(1), unless the authorization is terminated or revoked sooner.  Performed at [facility], [place] [address], [place], [place] [address]      Time coordinating discharge: 35 minutes  SIGNED:   Aline August, MD  [facility] 01/21/2021, 10:35 AM

## 2021-01-21 NOTE — Progress Notes (Signed)
PT Cancellation Note  Patient Details Name: MINAYA WHITEFORD MRN: [id number] DOB: Feb 23, 1951   Cancelled Treatment:    Reason Eval/Treat Not Completed: Other (comment)  Pt up in bathroom dressing with RN going over d/c papers.  She denied need for PT.   Abran Richard, PT Acute Rehab Services Pager [phone] [facility] 01/21/2021, 9:55 AM

## 2021-01-21 NOTE — Plan of Care (Signed)
Pt is injury free, afebrile, alert, and oriented x 4. BP was elevated and she is asymptomatic (Hospitalist in call was notified). She denies chest pain, SOB, or acute changes during this shift. Will continue to monitor   Problem: Education: Goal: Knowledge of General Education information will improve Description: Including pain rating scale, medication(s)/side effects and non-pharmacologic comfort measures Outcome: Progressing   Problem: Health Behavior/Discharge Planning: Goal: Ability to manage health-related needs will improve Outcome: Progressing   Problem: Clinical Measurements: Goal: Ability to maintain clinical measurements within normal limits will improve Outcome: Progressing Goal: Will remain free from infection Outcome: Progressing Goal: Diagnostic test results will improve Outcome: Progressing Goal: Respiratory complications will improve Outcome: Progressing Goal: Cardiovascular complication will be avoided Outcome: Progressing   Problem: Activity: Goal: Risk for activity intolerance will decrease Outcome: Progressing   Problem: Nutrition: Goal: Adequate nutrition will be maintained Outcome: Progressing   Problem: Coping: Goal: Level of anxiety will decrease Outcome: Progressing   Problem: Elimination: Goal: Will not experience complications related to bowel motility Outcome: Progressing Goal: Will not experience complications related to urinary retention Outcome: Progressing   Problem: Pain Managment: Goal: General experience of comfort will improve Outcome: Progressing   Problem: Safety: Goal: Ability to remain free from injury will improve Outcome: Progressing   Problem: Skin Integrity: Goal: Risk for impaired skin integrity will decrease Outcome: Progressing

## 2021-01-21 NOTE — Progress Notes (Signed)
Patient will be discharging home with boyfriend this morning. Belongings were returned. Education on medication will be provided.

## 2021-01-22 LAB — CALCIUM, IONIZED: Calcium, Ionized, Serum: 3.5 mg/dL — ABNORMAL LOW (ref 4.5–5.6)

## 2021-01-23 ENCOUNTER — Telehealth: Payer: Self-pay | Admitting: Hematology

## 2021-01-23 NOTE — Telephone Encounter (Signed)
Scheduled follow-up appointments per 8/25 los. Patient is aware. 

## 2021-01-24 ENCOUNTER — Inpatient Hospital Stay: Payer: Medicare Other

## 2021-01-25 ENCOUNTER — Other Ambulatory Visit: Payer: Self-pay

## 2021-01-25 ENCOUNTER — Encounter: Payer: Self-pay | Admitting: Internal Medicine

## 2021-01-25 ENCOUNTER — Ambulatory Visit (INDEPENDENT_AMBULATORY_CARE_PROVIDER_SITE_OTHER): Payer: Medicare Other | Admitting: Internal Medicine

## 2021-01-25 VITALS — BP 154/93 | HR 92 | Temp 98.5°F | Resp 20 | Ht 61.0 in | Wt 166.3 lb

## 2021-01-25 DIAGNOSIS — S78112A Complete traumatic amputation at level between left hip and knee, initial encounter: Secondary | ICD-10-CM | POA: Diagnosis not present

## 2021-01-25 DIAGNOSIS — E878 Other disorders of electrolyte and fluid balance, not elsewhere classified: Secondary | ICD-10-CM

## 2021-01-25 DIAGNOSIS — F1910 Other psychoactive substance abuse, uncomplicated: Secondary | ICD-10-CM | POA: Diagnosis not present

## 2021-01-25 HISTORY — DX: Other disorders of electrolyte and fluid balance, not elsewhere classified: E87.8

## 2021-01-25 NOTE — Assessment & Plan Note (Deleted)
Patient recently evaluated and treated for hypokalemia of 2.8, hypomagnesemia of 0.4, and hypocalcemia of 6.7. This was also a cause for hospital admission in May 2022 prompting concern for other causes of recurring electrolyte abnormalities such as malnutrition, GI losses, endocrine dysfunction, or some sort of autoimmune hypoparathyroidism.   She does admit to inadequate intake of food and frequent drug use leading to malnutrition in general. She does not have trouble with GI losses like diarrhea, vomiting. Physical exam is not convincing for an endocrine cause of recurring electrolyte abnormalities such as Cushing's disease I.e. no striae, buffalo hump, moon faces. PTH is normal at 28, vitamin D is normal.  At this time the patient has been counseled on the importance of adequate nutritional intake to prevent future recurrent episodes like this one. She has also been instructed to discontinue omeprazole 20 mg daily as this is magnesium-wasting. She voiced understanding.  She has an appointment with oncology tomorrow morning 01/26/2021 where they will be checking labs so I will defer drawing labs during this visit.

## 2021-01-25 NOTE — Assessment & Plan Note (Addendum)
L AKA with prosthesis. She states that she is in need of new prosthetic equipment due to poor fit: liners and stockings. She is also requesting bedside commode.

## 2021-01-25 NOTE — Patient Instructions (Addendum)
Thank you for visiting the Internal Medicine Clinic today. Today we discussed how you have been feeling since being released from the hospital where you were found to have low potassium, magnesium, and calcium. I am glad that you are feeling better and that these levels in your blood were normal before you left the hospital however I am worried about why this has happened multiple times now and think that it could be related to not getting enough nutrition.  Medication changes: Please STOP taking omeprazole for your acid reflux.  Follow-up: In November with Dr. Charleen Kirks  Remember: If you have any questions or concerns, please call our clinic at [phone] between 9am-5pm and after hours call [phone] and ask for the internal medicine resident on call. If you feel you are having a medical emergency please call 911.  Farrel Gordon, DO

## 2021-01-25 NOTE — Assessment & Plan Note (Signed)
Patient admits to ongoing substance abuse including marijuana, cocaine. She has been counseled on discontinuing these substances.

## 2021-01-25 NOTE — Assessment & Plan Note (Signed)
Patient recently evaluated and treated for hypokalemia of 2.8, hypomagnesemia of 0.4, and hypocalcemia of 6.7. This was also a cause for hospital admission in May 2022 prompting concern for other causes of recurring electrolyte abnormalities such as malnutrition, GI losses, endocrine dysfunction, or some sort of autoimmune hypoparathyroidism.   She does admit to inadequate intake of food and frequent drug use leading to malnutrition in general. She does not have trouble with GI losses like diarrhea, vomiting. Physical exam is not convincing for an endocrine cause of recurring electrolyte abnormalities such as Cushing's disease I.e. no striae, buffalo hump, moon faces. PTH is normal at 28, vitamin D is normal.  At this time the patient has been counseled on the importance of adequate nutritional intake to prevent future recurrent episodes like this one. She has also been instructed to discontinue omeprazole 20 mg daily as this is magnesium-wasting. She voiced understanding.  She has an appointment with oncology tomorrow morning 01/26/2021 where they will be checking labs so I will defer drawing labs during this visit.

## 2021-01-25 NOTE — Progress Notes (Signed)
CC: electrolyte abnormality follow-up  HPI:  [name] is a 70 y.o. with past medical history listed below presents for hospital follow-up after being found to have multiple electrolyte abnormalities including hypokalemia, hypomagnesemia, and hypocalcemia. Please see problem based charting for detailed assessment and plan.  Past Medical History:  Diagnosis Date   Allergic rhinitis    Anemia, iron deficiency 05/03/2011   2/2 GI AVMs - recieves iron infusions as well as periodic red blood cell transfusions   Anxiety    Arteriovenous malformation of duodenum 02/25/2015   CARPAL TUNNEL RELEASE, HX OF 07/22/2008   Chest pain 10/28/2019   Colon polyps    Diabetes mellitus without complication ([place])    type 2   Difficulty sleeping    takes trazadone for sleep   Diverticulosis    Fall 01/03/2020   GERD (gastroesophageal reflux disease)    Hepatitis C    in SVR with [facility] 12/2014-02/2015.    History of hernia repair 08/18/2012   History of porphyria 11/30/2014   Porphyria cutanea tarda in setting of chronic Hepatitis C, frequent  IV iron infusions, and alcohol abuse    Hypertension    Hypocalcemia 10/28/2019   Hypokalemia 09/27/2014   Peripheral vascular disease ([place])    Stroke ([place]) 2010   no residual deficits   Substance use disorder    cocaine   Tachycardia 07/10/2017   Trichomonas vaginitis 04/16/2019   Review of Systems:  Review of Systems  Constitutional:  Negative for chills, fever and malaise/fatigue.  Respiratory:  Negative for shortness of breath and wheezing.   Cardiovascular:  Negative for chest pain and leg swelling.  Gastrointestinal:  Negative for abdominal pain, constipation, diarrhea, nausea and vomiting.  Genitourinary:  Negative for dysuria and urgency.  Musculoskeletal:  Negative for myalgias.  Neurological:  Negative for dizziness, loss of consciousness, weakness and headaches.    Physical Exam:  Vitals:   01/25/21 0933  BP: (!) 154/93  Pulse: 92   Resp: 20  Temp: 98.5 F (36.9 C)  TempSrc: Oral  SpO2: 99%  Weight: 166 lb 4.8 oz (75.4 kg)  Height: '5\' 1"'$  (1.549 m)   Physical Exam Vitals and nursing note reviewed.  Constitutional:      Appearance: Normal appearance.  Cardiovascular:     Rate and Rhythm: Normal rate and regular rhythm.     Pulses:          Radial pulses are 2+ on the right side and 2+ on the left side.       Posterior tibial pulses are 2+ on the right side.     Heart sounds: Normal heart sounds.  Pulmonary:     Effort: Pulmonary effort is normal.     Breath sounds: Normal air entry. Examination of the right-lower field reveals wheezing. Examination of the left-lower field reveals wheezing. Wheezing present.  Musculoskeletal:     Right lower leg: 1+ Pitting Edema present.     Left lower leg: 1+ Pitting Edema present.     Left Lower Extremity: Left leg is amputated above knee.  Skin:    General: Skin is warm and dry.  Neurological:     General: No focal deficit present.     Mental Status: She is alert and oriented to person, place, and time.  Psychiatric:        Behavior: Behavior is cooperative.     Assessment & Plan:   See Encounters Tab for problem based charting.  Patient seen with Dr. Evette Doffing

## 2021-01-26 ENCOUNTER — Inpatient Hospital Stay: Payer: Medicare Other

## 2021-01-26 ENCOUNTER — Inpatient Hospital Stay: Payer: Medicare Other | Attending: Hematology

## 2021-01-26 ENCOUNTER — Other Ambulatory Visit: Payer: Self-pay

## 2021-01-26 ENCOUNTER — Other Ambulatory Visit (HOSPITAL_COMMUNITY): Payer: Self-pay

## 2021-01-26 ENCOUNTER — Telehealth: Payer: Self-pay

## 2021-01-26 ENCOUNTER — Other Ambulatory Visit: Payer: Self-pay | Admitting: Hematology

## 2021-01-26 VITALS — BP 160/88 | HR 86 | Temp 98.6°F | Resp 18

## 2021-01-26 DIAGNOSIS — B182 Chronic viral hepatitis C: Secondary | ICD-10-CM | POA: Diagnosis not present

## 2021-01-26 DIAGNOSIS — R809 Proteinuria, unspecified: Secondary | ICD-10-CM | POA: Diagnosis not present

## 2021-01-26 DIAGNOSIS — K922 Gastrointestinal hemorrhage, unspecified: Secondary | ICD-10-CM | POA: Insufficient documentation

## 2021-01-26 DIAGNOSIS — Z79899 Other long term (current) drug therapy: Secondary | ICD-10-CM | POA: Insufficient documentation

## 2021-01-26 DIAGNOSIS — Z8719 Personal history of other diseases of the digestive system: Secondary | ICD-10-CM | POA: Insufficient documentation

## 2021-01-26 DIAGNOSIS — K219 Gastro-esophageal reflux disease without esophagitis: Secondary | ICD-10-CM | POA: Diagnosis not present

## 2021-01-26 DIAGNOSIS — E1136 Type 2 diabetes mellitus with diabetic cataract: Secondary | ICD-10-CM | POA: Insufficient documentation

## 2021-01-26 DIAGNOSIS — D500 Iron deficiency anemia secondary to blood loss (chronic): Secondary | ICD-10-CM

## 2021-01-26 DIAGNOSIS — Z7982 Long term (current) use of aspirin: Secondary | ICD-10-CM | POA: Diagnosis not present

## 2021-01-26 DIAGNOSIS — Z8673 Personal history of transient ischemic attack (TIA), and cerebral infarction without residual deficits: Secondary | ICD-10-CM | POA: Insufficient documentation

## 2021-01-26 DIAGNOSIS — E1151 Type 2 diabetes mellitus with diabetic peripheral angiopathy without gangrene: Secondary | ICD-10-CM | POA: Diagnosis not present

## 2021-01-26 DIAGNOSIS — Z794 Long term (current) use of insulin: Secondary | ICD-10-CM | POA: Insufficient documentation

## 2021-01-26 LAB — CBC WITH DIFFERENTIAL (CANCER CENTER ONLY)
Abs Immature Granulocytes: 0.09 10*3/uL — ABNORMAL HIGH (ref 0.00–0.07)
Basophils Absolute: 0.1 10*3/uL (ref 0.0–0.1)
Basophils Relative: 1 %
Eosinophils Absolute: 0.1 10*3/uL (ref 0.0–0.5)
Eosinophils Relative: 1 %
HCT: 39.6 % (ref 36.0–46.0)
Hemoglobin: 13.6 g/dL (ref 12.0–15.0)
Immature Granulocytes: 1 %
Lymphocytes Relative: 14 %
Lymphs Abs: 1.1 10*3/uL (ref 0.7–4.0)
MCH: 32.5 pg (ref 26.0–34.0)
MCHC: 34.3 g/dL (ref 30.0–36.0)
MCV: 94.7 fL (ref 80.0–100.0)
Monocytes Absolute: 1 10*3/uL (ref 0.1–1.0)
Monocytes Relative: 13 %
Neutro Abs: 5.4 10*3/uL (ref 1.7–7.7)
Neutrophils Relative %: 70 %
Platelet Count: 295 10*3/uL (ref 150–400)
RBC: 4.18 MIL/uL (ref 3.87–5.11)
RDW: 12.1 % (ref 11.5–15.5)
WBC Count: 7.8 10*3/uL (ref 4.0–10.5)
nRBC: 0 % (ref 0.0–0.2)

## 2021-01-26 LAB — BASIC METABOLIC PANEL - CANCER CENTER ONLY
Anion gap: 14 (ref 5–15)
BUN: 18 mg/dL (ref 8–23)
CO2: 25 mmol/L (ref 22–32)
Calcium: 8.1 mg/dL — ABNORMAL LOW (ref 8.9–10.3)
Chloride: 105 mmol/L (ref 98–111)
Creatinine: 1.31 mg/dL — ABNORMAL HIGH (ref 0.44–1.00)
GFR, Estimated: 44 mL/min — ABNORMAL LOW (ref >60)
Glucose, Bld: 116 mg/dL — ABNORMAL HIGH (ref 70–99)
Potassium: 4.2 mmol/L (ref 3.5–5.1)
Sodium: 144 mmol/L (ref 135–145)

## 2021-01-26 LAB — FERRITIN: Ferritin: 536 ng/mL — ABNORMAL HIGH (ref 11–307)

## 2021-01-26 LAB — MAGNESIUM: Magnesium: 0.9 mg/dL — CL (ref 1.7–2.4)

## 2021-01-26 LAB — TOTAL PROTEIN, URINE DIPSTICK: Protein, ur: 300 mg/dL — AB

## 2021-01-26 MED ORDER — MAGNESIUM SULFATE 4 GM/100ML IV SOLN
4.0000 g | Freq: Once | INTRAVENOUS | Status: AC
  Administered 2021-01-26: 4 g via INTRAVENOUS
  Filled 2021-01-26: qty 100

## 2021-01-26 MED ORDER — SODIUM CHLORIDE 0.9 % IV SOLN
Freq: Once | INTRAVENOUS | Status: AC
Start: 2021-01-26 — End: 2021-01-26

## 2021-01-26 MED ORDER — MAGNESIUM OXIDE -MG SUPPLEMENT 400 (240 MG) MG PO TABS
400.0000 mg | ORAL_TABLET | Freq: Every day | ORAL | 0 refills | Status: DC
  Filled 2021-01-26: qty 30, 30d supply, fill #0

## 2021-01-26 MED ORDER — SODIUM CHLORIDE 0.9 % IV SOLN
2.0000 g | Freq: Once | INTRAVENOUS | Status: AC
  Administered 2021-01-26: 2 g via INTRAVENOUS
  Filled 2021-01-26: qty 20

## 2021-01-26 NOTE — Telephone Encounter (Signed)
CRITICAL VALUE STICKER  CRITICAL VALUE: MAGNESIUM 0.9  RECEIVER (on-site recipient of call): Vaunda Gutterman P. LPN  DATE & TIME NOTIFIED: 01/26/21 @ 10:05am  MESSENGER (representative from lab): Verdis Frederickson  MD NOTIFIED: Dr. Burr Medico  TIME OF NOTIFICATION: 10:08am

## 2021-01-26 NOTE — Patient Instructions (Addendum)
Hypocalcemia, Adult Hypocalcemia is when the level of calcium in a person's blood is below normal. Calcium is a mineral that is used by the body in many ways. Not having enough blood calcium can affect the nervous system. This can lead to problems with muscles, the heart, and the brain. What are the causes? This condition may be caused by: A deficiency of vitamin D or magnesium or both. Decreased levels of parathyroid hormone (hypoparathyroidism). Kidney function problems. Low levels of a body protein called albumin. Inflammation of the pancreas (pancreatitis). Not taking in enough vitamins and minerals in the diet or having intestinal problems that interfere with nutrient absorption. Certain medicines. What are the signs or symptoms? Some people may not have any symptoms, especially if they have long-term (chronic) hypocalcemia. Symptoms of this condition may include: Numbness and tingling in the fingers, toes, or around the mouth. Muscle twitching, aches, or cramps, especially in the legs, feet, and back. Spasm of the voice box (laryngospasm). This may make it difficult to breath or speak. Fast heartbeats (palpitations) and abnormal heart rhythms (arrhythmias). Shaking uncontrollably (seizures). Memory problems, confusion, or difficulty thinking. Depression, anxiety, irritability, or changes in personality. Long-term symptoms of this condition may include: Coarse, brittle hair and nails. Dry skin or lasting skin diseases (psoriasis, eczema, or dermatitis). Dental cavities. Clouding of the eye lens (cataracts). How is this diagnosed? This condition is usually diagnosed with a blood test. You may also have other tests to help determine the underlying cause of the condition. This may include more blood tests and imaging tests. How is this treated? This condition may be treated with: Calcium given by mouth (orally) or given through an IV. The method used for giving calcium will depend on  the severity of the condition. If your condition is severe, you may need to be closely monitored in the hospital. Giving other minerals (electrolytes), such as magnesium. Other treatment will depend on the cause of the condition. Follow these instructions at home: Follow diet instructions from your health care provider or dietitian. Take supplements only as told by your health care provider. Keep all follow-up visits as told by your health care provider. This is important. Contact a health care provider if you: Have increased muscle twitching or cramps. Have new swelling in the feet, ankles, or legs. Develop changes in mood, memory, or personality. Get help right away if you: Have chest pain. Have persistent rapid or irregular heartbeats. Have difficulty breathing. Faint. Start to have seizures. Have confusion. Summary Hypocalcemia is when the level of calcium in a person's blood is below normal. Not having enough blood calcium can affect the nervous system. This can lead to problems with muscles, the heart, and the brain. This condition may be treated with calcium given by mouth or through an IV, taking other minerals, and treating the underlying cause of hypocalcemia. Take supplements only as told by your health care provider. Contact a health care provider if you have new or worsening symptoms. Keep all follow-up visits as told by your health care provider. This is important. This information is not intended to replace advice given to you by your health care provider. Make sure you discuss any questions you have with your health care provider. Document Revised: 05/23/2018 Document Reviewed: 05/23/2018 Elsevier Patient Education  2022 [place].  Calcium Gluconate Injection What is this medication? CALCIUM GLUCONATE (KAL see um GLOO koe nate) increases calcium levels in your body. Calcium is a mineral that plays an important role in building  strong bones and maintaining heart  health. This medicine may be used for other purposes; ask your health care provider or pharmacist if you have questions. What should I tell my care team before I take this medication? They need to know if you have any of these conditions: High levels of calcium in the blood History of irregular heartbeat History of kidney stones Kidney disease Parathyroid disease An unusual or allergic reaction to calcium, other medications, foods, dyes, or preservatives Pregnant or trying to get pregnant Breast-feeding How should I use this medication? This medication is injected into a vein. It is given by your care team in a hospital or clinic. Talk to your care team regarding the use of this medication in children. While it may be given to children for selected conditions, precautions do apply. Overdosage: If you think you have taken too much of this medicine contact a poison control center or emergency room at once. NOTE: This medicine is only for you. Do not share this medicine with others. What if I miss a dose? This does not apply. What may interact with this medication? Ceftriaxone Certain diuretics Digoxin Other calcium products This list may not describe all possible interactions. Give your health care provider a list of all the medicines, herbs, non-prescription drugs, or dietary supplements you use. Also tell them if you smoke, drink alcohol, or use illegal drugs. Some items may interact with your medicine. What should I watch for while using this medication? Your condition will be monitored carefully while you are receiving this medication. You may need blood work while you are taking this medication. What side effects may I notice from receiving this medication? Side effects that you should report to your care team as soon as possible: Allergic reactions-skin rash, itching, hives, swelling of the face, lips, tongue, or throat Fast or irregular heartbeat High calcium level-increased  thirst or amount of urine, nausea, vomiting, confusion, unusual weakness or fatigue, bone pain Low blood pressure-dizziness, feeling faint or lightheaded, blurry vision Pain, redness, or irritation at injection site Side effects that usually do not require medical attention (report to your care team if they continue or are bothersome): Change in taste Flushing, mostly over the face, neck, and chest, during injection This list may not describe all possible side effects. Call your doctor for medical advice about side effects. You may report side effects to FDA at 1-800-FDA-1088. Where should I keep my medication? This medication is given in a hospital or clinic. It will not be stored at home. NOTE: This sheet is a summary. It may not cover all possible information. If you have questions about this medicine, talk to your doctor, pharmacist, or health care provider.  2022 Elsevier/Gold Standard (2020-05-12 13:30:41)  Hypomagnesemia Hypomagnesemia is a condition in which the level of magnesium in the blood is low. Magnesium is a mineral that is found in many foods. It is used in many different processes in the body. Hypomagnesemia can affect every organ in the body. In severe cases, it can cause life-threatening problems. What are the causes? This condition may be caused by: Not getting enough magnesium in your diet. Malnutrition. Problems with absorbing magnesium from the intestines. Dehydration. Alcohol abuse. Vomiting. Severe or chronic diarrhea. Some medicines, including medicines that make you urinate more (diuretics). Certain diseases, such as kidney disease, diabetes, celiac disease, and overactive thyroid. What are the signs or symptoms? Symptoms of this condition include: Loss of appetite. Nausea and vomiting. Involuntary shaking or trembling of a body  part (tremor). Muscle weakness. Tingling in the arms and legs. Sudden tightening of muscles (muscle  spasms). Confusion. Psychiatric issues, such as depression, irritability, or psychosis. A feeling of fluttering of the heart. Seizures. These symptoms are more severe if magnesium levels drop suddenly. How is this diagnosed? This condition may be diagnosed based on: Your symptoms and medical history. A physical exam. Blood and urine tests. How is this treated? Treatment depends on the cause and the severity of the condition. It may be treated with: A magnesium supplement. This can be taken in pill form. If the condition is severe, magnesium is usually given through an IV. Changes to your diet. You may be directed to eat foods that have a lot of magnesium, such as green leafy vegetables, peas, beans, and nuts. Stopping any intake of alcohol. Follow these instructions at home:   Make sure that your diet includes foods with magnesium. Foods that have a lot of magnesium in them include: Green leafy vegetables, such as spinach and broccoli. Beans and peas. Nuts and seeds, such as almonds and sunflower seeds. Whole grains, such as whole grain bread and fortified cereals. Take magnesium supplements if your health care provider tells you to do that. Take them as directed. Take over-the-counter and prescription medicines only as told by your health care provider. Have your magnesium levels monitored as told by your health care provider. When you are active, drink fluids that contain electrolytes. Avoid drinking alcohol. Keep all follow-up visits as told by your health care provider. This is important. Contact a health care provider if: You get worse instead of better. Your symptoms return. Get help right away if you: Develop severe muscle weakness. Have trouble breathing. Feel that your heart is racing. Summary Hypomagnesemia is a condition in which the level of magnesium in the blood is low. Hypomagnesemia can affect every organ in the body. Treatment may include eating more foods that  contain magnesium, taking magnesium supplements, and not drinking alcohol. Have your magnesium levels monitored as told by your health care provider. This information is not intended to replace advice given to you by your health care provider. Make sure you discuss any questions you have with your health care provider. Document Revised: 07/27/2020 Document Reviewed: 10/15/2019 Elsevier Patient Education  [place].

## 2021-01-26 NOTE — Progress Notes (Signed)
Internal Medicine Clinic Attending  I saw and evaluated the patient.  I personally confirmed the key portions of the history and exam documented by Dr.  Dean  and I reviewed pertinent patient test results.  The assessment, diagnosis, and plan were formulated together and I agree with the documentation in the resident's note.  

## 2021-02-02 ENCOUNTER — Other Ambulatory Visit: Payer: Self-pay | Admitting: *Deleted

## 2021-02-02 MED ORDER — GABAPENTIN 400 MG PO CAPS: ORAL_CAPSULE | ORAL | 1 refills | Status: DC

## 2021-02-03 ENCOUNTER — Ambulatory Visit: Payer: Medicare Other

## 2021-02-03 ENCOUNTER — Inpatient Hospital Stay: Payer: Medicare Other

## 2021-02-03 ENCOUNTER — Other Ambulatory Visit: Payer: Self-pay

## 2021-02-03 ENCOUNTER — Other Ambulatory Visit: Payer: Medicare Other

## 2021-02-03 DIAGNOSIS — D500 Iron deficiency anemia secondary to blood loss (chronic): Secondary | ICD-10-CM

## 2021-02-03 LAB — CBC WITH DIFFERENTIAL (CANCER CENTER ONLY)
Abs Immature Granulocytes: 0.08 10*3/uL — ABNORMAL HIGH (ref 0.00–0.07)
Basophils Absolute: 0 10*3/uL (ref 0.0–0.1)
Basophils Relative: 0 %
Eosinophils Absolute: 0.2 10*3/uL (ref 0.0–0.5)
Eosinophils Relative: 2 %
HCT: 42.4 % (ref 36.0–46.0)
Hemoglobin: 14.4 g/dL (ref 12.0–15.0)
Immature Granulocytes: 1 %
Lymphocytes Relative: 12 %
Lymphs Abs: 1.1 10*3/uL (ref 0.7–4.0)
MCH: 32.4 pg (ref 26.0–34.0)
MCHC: 34 g/dL (ref 30.0–36.0)
MCV: 95.5 fL (ref 80.0–100.0)
Monocytes Absolute: 1 10*3/uL (ref 0.1–1.0)
Monocytes Relative: 10 %
Neutro Abs: 6.9 10*3/uL (ref 1.7–7.7)
Neutrophils Relative %: 75 %
Platelet Count: 294 10*3/uL (ref 150–400)
RBC: 4.44 MIL/uL (ref 3.87–5.11)
RDW: 12 % (ref 11.5–15.5)
WBC Count: 9.3 10*3/uL (ref 4.0–10.5)
nRBC: 0 % (ref 0.0–0.2)

## 2021-02-03 LAB — MAGNESIUM: Magnesium: 1.2 mg/dL — ABNORMAL LOW (ref 1.7–2.4)

## 2021-02-03 LAB — BASIC METABOLIC PANEL - CANCER CENTER ONLY
Anion gap: 12 (ref 5–15)
BUN: 15 mg/dL (ref 8–23)
CO2: 24 mmol/L (ref 22–32)
Calcium: 9.4 mg/dL (ref 8.9–10.3)
Chloride: 102 mmol/L (ref 98–111)
Creatinine: 1.47 mg/dL — ABNORMAL HIGH (ref 0.44–1.00)
GFR, Estimated: 39 mL/min — ABNORMAL LOW (ref >60)
Glucose, Bld: 112 mg/dL — ABNORMAL HIGH (ref 70–99)
Potassium: 4.6 mmol/L (ref 3.5–5.1)
Sodium: 138 mmol/L (ref 135–145)

## 2021-02-03 LAB — FERRITIN: Ferritin: 720 ng/mL — ABNORMAL HIGH (ref 11–307)

## 2021-02-04 ENCOUNTER — Other Ambulatory Visit: Payer: Self-pay

## 2021-02-04 ENCOUNTER — Inpatient Hospital Stay: Payer: Medicare Other

## 2021-02-04 VITALS — BP 131/74 | HR 91 | Temp 97.7°F | Resp 18

## 2021-02-04 DIAGNOSIS — D500 Iron deficiency anemia secondary to blood loss (chronic): Secondary | ICD-10-CM

## 2021-02-04 MED ORDER — MAGNESIUM SULFATE 4 GM/100ML IV SOLN
4.0000 g | Freq: Once | INTRAVENOUS | Status: AC
  Administered 2021-02-04: 4 g via INTRAVENOUS
  Filled 2021-02-04: qty 100

## 2021-02-04 MED ORDER — SODIUM CHLORIDE 0.9 % IV SOLN: Freq: Once | INTRAVENOUS | Status: AC

## 2021-02-04 NOTE — Patient Instructions (Signed)
Hypomagnesemia Hypomagnesemia is a condition in which the level of magnesium in the blood is low. Magnesium is a mineral that is found in many foods. It is used in many different processes in the body. Hypomagnesemia can affect every organ in the body. In severe cases, it can cause life-threatening problems. What are the causes? This condition may be caused by: Not getting enough magnesium in your diet. Malnutrition. Problems with absorbing magnesium from the intestines. Dehydration. Alcohol abuse. Vomiting. Severe or chronic diarrhea. Some medicines, including medicines that make you urinate more (diuretics). Certain diseases, such as kidney disease, diabetes, celiac disease, and overactive thyroid. What are the signs or symptoms? Symptoms of this condition include: Loss of appetite. Nausea and vomiting. Involuntary shaking or trembling of a body part (tremor). Muscle weakness. Tingling in the arms and legs. Sudden tightening of muscles (muscle spasms). Confusion. Psychiatric issues, such as depression, irritability, or psychosis. A feeling of fluttering of the heart. Seizures. These symptoms are more severe if magnesium levels drop suddenly. How is this diagnosed? This condition may be diagnosed based on: Your symptoms and medical history. A physical exam. Blood and urine tests. How is this treated? Treatment depends on the cause and the severity of the condition. It may be treated with: A magnesium supplement. This can be taken in pill form. If the condition is severe, magnesium is usually given through an IV. Changes to your diet. You may be directed to eat foods that have a lot of magnesium, such as green leafy vegetables, peas, beans, and nuts. Stopping any intake of alcohol. Follow these instructions at home:   Make sure that your diet includes foods with magnesium. Foods that have a lot of magnesium in them include: Green leafy vegetables, such as spinach and  broccoli. Beans and peas. Nuts and seeds, such as almonds and sunflower seeds. Whole grains, such as whole grain bread and fortified cereals. Take magnesium supplements if your health care provider tells you to do that. Take them as directed. Take over-the-counter and prescription medicines only as told by your health care provider. Have your magnesium levels monitored as told by your health care provider. When you are active, drink fluids that contain electrolytes. Avoid drinking alcohol. Keep all follow-up visits as told by your health care provider. This is important. Contact a health care provider if: You get worse instead of better. Your symptoms return. Get help right away if you: Develop severe muscle weakness. Have trouble breathing. Feel that your heart is racing. Summary Hypomagnesemia is a condition in which the level of magnesium in the blood is low. Hypomagnesemia can affect every organ in the body. Treatment may include eating more foods that contain magnesium, taking magnesium supplements, and not drinking alcohol. Have your magnesium levels monitored as told by your health care provider. This information is not intended to replace advice given to you by your health care provider. Make sure you discuss any questions you have with your health care provider. Document Revised: 07/27/2020 Document Reviewed: 10/15/2019 Elsevier Patient Education  [place].

## 2021-02-09 ENCOUNTER — Other Ambulatory Visit: Payer: Self-pay

## 2021-02-09 ENCOUNTER — Inpatient Hospital Stay: Payer: Medicare Other

## 2021-02-09 VITALS — BP 143/83 | HR 99 | Temp 98.2°F | Resp 18

## 2021-02-09 DIAGNOSIS — D500 Iron deficiency anemia secondary to blood loss (chronic): Secondary | ICD-10-CM

## 2021-02-09 LAB — CBC WITH DIFFERENTIAL (CANCER CENTER ONLY)
Abs Immature Granulocytes: 0.04 10*3/uL (ref 0.00–0.07)
Basophils Absolute: 0.1 10*3/uL (ref 0.0–0.1)
Basophils Relative: 1 %
Eosinophils Absolute: 0.2 10*3/uL (ref 0.0–0.5)
Eosinophils Relative: 3 %
HCT: 42.5 % (ref 36.0–46.0)
Hemoglobin: 14.5 g/dL (ref 12.0–15.0)
Immature Granulocytes: 1 %
Lymphocytes Relative: 15 %
Lymphs Abs: 1.2 10*3/uL (ref 0.7–4.0)
MCH: 31.8 pg (ref 26.0–34.0)
MCHC: 34.1 g/dL (ref 30.0–36.0)
MCV: 93.2 fL (ref 80.0–100.0)
Monocytes Absolute: 0.8 10*3/uL (ref 0.1–1.0)
Monocytes Relative: 11 %
Neutro Abs: 5.3 10*3/uL (ref 1.7–7.7)
Neutrophils Relative %: 69 %
Platelet Count: 323 10*3/uL (ref 150–400)
RBC: 4.56 MIL/uL (ref 3.87–5.11)
RDW: 12 % (ref 11.5–15.5)
WBC Count: 7.6 10*3/uL (ref 4.0–10.5)
nRBC: 0 % (ref 0.0–0.2)

## 2021-02-09 LAB — BASIC METABOLIC PANEL - CANCER CENTER ONLY
Anion gap: 13 (ref 5–15)
BUN: 28 mg/dL — ABNORMAL HIGH (ref 8–23)
CO2: 23 mmol/L (ref 22–32)
Calcium: 9.6 mg/dL (ref 8.9–10.3)
Chloride: 103 mmol/L (ref 98–111)
Creatinine: 1.81 mg/dL — ABNORMAL HIGH (ref 0.44–1.00)
GFR, Estimated: 30 mL/min — ABNORMAL LOW (ref >60)
Glucose, Bld: 122 mg/dL — ABNORMAL HIGH (ref 70–99)
Potassium: 4.2 mmol/L (ref 3.5–5.1)
Sodium: 139 mmol/L (ref 135–145)

## 2021-02-09 LAB — MAGNESIUM: Magnesium: 1.3 mg/dL — ABNORMAL LOW (ref 1.7–2.4)

## 2021-02-09 LAB — TOTAL PROTEIN, URINE DIPSTICK: Protein, ur: 300 mg/dL — AB

## 2021-02-09 LAB — FERRITIN: Ferritin: 849 ng/mL — ABNORMAL HIGH (ref 11–307)

## 2021-02-09 MED ORDER — SODIUM CHLORIDE 0.9 % IV SOLN: Freq: Once | INTRAVENOUS | Status: AC

## 2021-02-09 MED ORDER — MAGNESIUM SULFATE 4 GM/100ML IV SOLN
4.0000 g | Freq: Once | INTRAVENOUS | Status: AC
  Administered 2021-02-09: 4 g via INTRAVENOUS
  Filled 2021-02-09: qty 100

## 2021-02-09 NOTE — Patient Instructions (Signed)
Hypomagnesemia Hypomagnesemia is a condition in which the level of magnesium in the blood is low. Magnesium is a mineral that is found in many foods. It is used in many different processes in the body. Hypomagnesemia can affect every organ in the body. In severe cases, it can cause life-threatening problems. What are the causes? This condition may be caused by: Not getting enough magnesium in your diet. Malnutrition. Problems with absorbing magnesium from the intestines. Dehydration. Alcohol abuse. Vomiting. Severe or chronic diarrhea. Some medicines, including medicines that make you urinate more (diuretics). Certain diseases, such as kidney disease, diabetes, celiac disease, and overactive thyroid. What are the signs or symptoms? Symptoms of this condition include: Loss of appetite. Nausea and vomiting. Involuntary shaking or trembling of a body part (tremor). Muscle weakness. Tingling in the arms and legs. Sudden tightening of muscles (muscle spasms). Confusion. Psychiatric issues, such as depression, irritability, or psychosis. A feeling of fluttering of the heart. Seizures. These symptoms are more severe if magnesium levels drop suddenly. How is this diagnosed? This condition may be diagnosed based on: Your symptoms and medical history. A physical exam. Blood and urine tests. How is this treated? Treatment depends on the cause and the severity of the condition. It may be treated with: A magnesium supplement. This can be taken in pill form. If the condition is severe, magnesium is usually given through an IV. Changes to your diet. You may be directed to eat foods that have a lot of magnesium, such as green leafy vegetables, peas, beans, and nuts. Stopping any intake of alcohol. Follow these instructions at home:   Make sure that your diet includes foods with magnesium. Foods that have a lot of magnesium in them include: Green leafy vegetables, such as spinach and  broccoli. Beans and peas. Nuts and seeds, such as almonds and sunflower seeds. Whole grains, such as whole grain bread and fortified cereals. Take magnesium supplements if your health care provider tells you to do that. Take them as directed. Take over-the-counter and prescription medicines only as told by your health care provider. Have your magnesium levels monitored as told by your health care provider. When you are active, drink fluids that contain electrolytes. Avoid drinking alcohol. Keep all follow-up visits as told by your health care provider. This is important. Contact a health care provider if: You get worse instead of better. Your symptoms return. Get help right away if you: Develop severe muscle weakness. Have trouble breathing. Feel that your heart is racing. Summary Hypomagnesemia is a condition in which the level of magnesium in the blood is low. Hypomagnesemia can affect every organ in the body. Treatment may include eating more foods that contain magnesium, taking magnesium supplements, and not drinking alcohol. Have your magnesium levels monitored as told by your health care provider. This information is not intended to replace advice given to you by your health care provider. Make sure you discuss any questions you have with your health care provider. Document Revised: 07/27/2020 Document Reviewed: 10/15/2019 Elsevier Patient Education  [place].

## 2021-02-12 ENCOUNTER — Other Ambulatory Visit: Payer: Self-pay | Admitting: Hematology

## 2021-02-12 DIAGNOSIS — N179 Acute kidney failure, unspecified: Secondary | ICD-10-CM

## 2021-02-15 ENCOUNTER — Telehealth: Payer: Self-pay

## 2021-02-15 NOTE — Telephone Encounter (Signed)
This nurse attempted to reach patient pre Dr. Burr Medico related to kidney function.  Unable to leave a message due to voicemail not picking up.  This nurse will attempt to reach patient again and send a message on My Chart.

## 2021-02-16 ENCOUNTER — Encounter: Payer: Self-pay | Admitting: Hematology

## 2021-02-16 ENCOUNTER — Other Ambulatory Visit: Payer: Self-pay

## 2021-02-16 ENCOUNTER — Inpatient Hospital Stay: Payer: Medicare Other

## 2021-02-16 ENCOUNTER — Inpatient Hospital Stay (HOSPITAL_BASED_OUTPATIENT_CLINIC_OR_DEPARTMENT_OTHER): Payer: Medicare Other | Admitting: Hematology

## 2021-02-16 VITALS — BP 135/86 | HR 97 | Temp 98.2°F | Resp 18 | Ht 61.0 in | Wt 159.8 lb

## 2021-02-16 DIAGNOSIS — D500 Iron deficiency anemia secondary to blood loss (chronic): Secondary | ICD-10-CM

## 2021-02-16 DIAGNOSIS — N179 Acute kidney failure, unspecified: Secondary | ICD-10-CM

## 2021-02-16 DIAGNOSIS — E876 Hypokalemia: Secondary | ICD-10-CM

## 2021-02-16 LAB — BASIC METABOLIC PANEL - CANCER CENTER ONLY
Anion gap: 9 (ref 5–15)
BUN: 29 mg/dL — ABNORMAL HIGH (ref 8–23)
CO2: 21 mmol/L — ABNORMAL LOW (ref 22–32)
Calcium: 9 mg/dL (ref 8.9–10.3)
Chloride: 110 mmol/L (ref 98–111)
Creatinine: 1.66 mg/dL — ABNORMAL HIGH (ref 0.44–1.00)
GFR, Estimated: 33 mL/min — ABNORMAL LOW (ref >60)
Glucose, Bld: 77 mg/dL (ref 70–99)
Potassium: 6.1 mmol/L — ABNORMAL HIGH (ref 3.5–5.1)
Sodium: 140 mmol/L (ref 135–145)

## 2021-02-16 LAB — IRON AND TIBC
Iron: 124 ug/dL (ref 41–142)
Saturation Ratios: 47 % (ref 21–57)
TIBC: 261 ug/dL (ref 236–444)
UIBC: 138 ug/dL (ref 120–384)

## 2021-02-16 LAB — CBC WITH DIFFERENTIAL (CANCER CENTER ONLY)
Abs Immature Granulocytes: 0.1 10*3/uL — ABNORMAL HIGH (ref 0.00–0.07)
Basophils Absolute: 0.1 10*3/uL (ref 0.0–0.1)
Basophils Relative: 1 %
Eosinophils Absolute: 0.2 10*3/uL (ref 0.0–0.5)
Eosinophils Relative: 2 %
HCT: 40.1 % (ref 36.0–46.0)
Hemoglobin: 13.6 g/dL (ref 12.0–15.0)
Immature Granulocytes: 2 %
Lymphocytes Relative: 24 %
Lymphs Abs: 1.6 10*3/uL (ref 0.7–4.0)
MCH: 32.2 pg (ref 26.0–34.0)
MCHC: 33.9 g/dL (ref 30.0–36.0)
MCV: 94.8 fL (ref 80.0–100.0)
Monocytes Absolute: 0.7 10*3/uL (ref 0.1–1.0)
Monocytes Relative: 11 %
Neutro Abs: 3.9 10*3/uL (ref 1.7–7.7)
Neutrophils Relative %: 60 %
Platelet Count: 284 10*3/uL (ref 150–400)
RBC: 4.23 MIL/uL (ref 3.87–5.11)
RDW: 12.3 % (ref 11.5–15.5)
WBC Count: 6.5 10*3/uL (ref 4.0–10.5)
nRBC: 0 % (ref 0.0–0.2)

## 2021-02-16 LAB — FERRITIN: Ferritin: 630 ng/mL — ABNORMAL HIGH (ref 11–307)

## 2021-02-16 LAB — MAGNESIUM: Magnesium: 1.1 mg/dL — ABNORMAL LOW (ref 1.7–2.4)

## 2021-02-16 MED ORDER — SODIUM CHLORIDE 0.9 % IV SOLN: Freq: Once | INTRAVENOUS | Status: AC

## 2021-02-16 MED ORDER — MAGNESIUM SULFATE 4 GM/100ML IV SOLN
4.0000 g | Freq: Once | INTRAVENOUS | Status: AC
  Administered 2021-02-16: 4 g via INTRAVENOUS
  Filled 2021-02-16: qty 100

## 2021-02-16 MED ORDER — SODIUM CHLORIDE 0.9 % IV SOLN: Freq: Once | INTRAVENOUS | Status: DC

## 2021-02-16 NOTE — Patient Instructions (Signed)
Hypomagnesemia Hypomagnesemia is a condition in which the level of magnesium in the blood is low. Magnesium is a mineral that is found in many foods. It is used in many different processes in the body. Hypomagnesemia can affect every organ in the body. In severe cases, it can cause life-threatening problems. What are the causes? This condition may be caused by: Not getting enough magnesium in your diet. Malnutrition. Problems with absorbing magnesium from the intestines. Dehydration. Alcohol abuse. Vomiting. Severe or chronic diarrhea. Some medicines, including medicines that make you urinate more (diuretics). Certain diseases, such as kidney disease, diabetes, celiac disease, and overactive thyroid. What are the signs or symptoms? Symptoms of this condition include: Loss of appetite. Nausea and vomiting. Involuntary shaking or trembling of a body part (tremor). Muscle weakness. Tingling in the arms and legs. Sudden tightening of muscles (muscle spasms). Confusion. Psychiatric issues, such as depression, irritability, or psychosis. A feeling of fluttering of the heart. Seizures. These symptoms are more severe if magnesium levels drop suddenly. How is this diagnosed? This condition may be diagnosed based on: Your symptoms and medical history. A physical exam. Blood and urine tests. How is this treated? Treatment depends on the cause and the severity of the condition. It may be treated with: A magnesium supplement. This can be taken in pill form. If the condition is severe, magnesium is usually given through an IV. Changes to your diet. You may be directed to eat foods that have a lot of magnesium, such as green leafy vegetables, peas, beans, and nuts. Stopping any intake of alcohol. Follow these instructions at home:   Make sure that your diet includes foods with magnesium. Foods that have a lot of magnesium in them include: Green leafy vegetables, such as spinach and  broccoli. Beans and peas. Nuts and seeds, such as almonds and sunflower seeds. Whole grains, such as whole grain bread and fortified cereals. Take magnesium supplements if your health care provider tells you to do that. Take them as directed. Take over-the-counter and prescription medicines only as told by your health care provider. Have your magnesium levels monitored as told by your health care provider. When you are active, drink fluids that contain electrolytes. Avoid drinking alcohol. Keep all follow-up visits as told by your health care provider. This is important. Contact a health care provider if: You get worse instead of better. Your symptoms return. Get help right away if you: Develop severe muscle weakness. Have trouble breathing. Feel that your heart is racing. Summary Hypomagnesemia is a condition in which the level of magnesium in the blood is low. Hypomagnesemia can affect every organ in the body. Treatment may include eating more foods that contain magnesium, taking magnesium supplements, and not drinking alcohol. Have your magnesium levels monitored as told by your health care provider. This information is not intended to replace advice given to you by your health care provider. Make sure you discuss any questions you have with your health care provider. Document Revised: 07/27/2020 Document Reviewed: 10/15/2019 Elsevier Patient Education  [place].

## 2021-02-16 NOTE — Progress Notes (Signed)
MD made a referral to Nephrology.  This nurse spoke with patient about it and was in agreement with going to [facility].  This nurse faxed referral, office notes, labs and demo sheet to their office,  No further concerns or questions at this time.

## 2021-02-16 NOTE — Progress Notes (Signed)
[place]   [phone] [phone]   Clinic Follow up Note   Patient Care Team: Jose Persia, MD as PCP - Evalina Field, MD as Consulting Physician (Ophthalmology) Coralie Keens, MD as Consulting Physician (General Surgery) Truitt Merle, MD as Consulting Physician (Hematology) Comer, Okey Regal, MD as Consulting Physician (Infectious Diseases) Loletha Carrow Kirke Corin, MD as Consulting Physician (Gastroenterology)  Date of Service:  02/16/2021  CHIEF COMPLAINT: f/u of anemia  CURRENT THERAPY:  -IV Feraheme as needed -Blood transfusions as needed, last 08/05/20  ASSESSMENT & PLAN:  [name] is a 70 y.o. female with   1. Iron deficiency anemia secondary to GI blood loss, and Anemia of Chronic Disease -She has a longstanding history of intermittent anemia dating back at least 2008 Hg was 10. - History of multiple GI bleeding in 2016, required blood transfusion, hospitalization and IV Feraheme -She had an EGD and colonoscopy on 10/29/2017 and 03/17/2018 with Dr. Loletha Carrow, which were normal. Endoscopy in [facility] and was found to have AVM in small bowel. -She has been on oral iron for 5-6 years now and has had intermittent black watery stool. -She has been receiving IV Feraheme every 1-2 month on average lately. Goal is to keep her Hg >10 and ferritin>100. Her last IV Feraheme was on 12/21/20 -She presented with acute GI bleeding in 06/2020, hospitalized and received 1 blood transfusion. Small bowel endoscopy Dr. Ardis Hughs on 07/08/20 showed non-bleeding AVM. This was cauterized.  CT was negative for occult malignancy. -Due to recurrent anemia requiring IV Feraheme and blood transfusions, she started bevacizumab for GI bleeding on 08/10/20  -She responded well, anemia resolved since starting Beva, held after 2 doses due to increasing BP and proteinuria.  We have continued Feraheme infusions as needed    2. Facial, b/l arm numbness -cause  unknown -experienced facial numbness on the right that spreads to the left side.  -facial twitching also noted today. -brain MRI w/o contrast 01/17/21 was negative   3. Hypocalcemia and hypomagnesia  -she was found to have calcium level of 5.8 on 01/16/21 when she presented to the ED for facial numbness. -this occurred again on 01/19/21, and she was referred to the ED and admitted. -I have been given her iv calcium and magnesium. It has improved -I strongly encourage her to f/u with PCP and see nephrologist for this    4. 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 Comorbidities: DM2, Trouble sleeping, GERD, chronic Hep C infection, cocaine use -Management per PCP's office.  -GERD uncontrolled on Tums and omeprazole 20 mg once daily -positive for hep C in 2009, received treatment in 2016. 05/2018 liver US unremarkable -h/o cocaine use, most recently 01/16/21 on urine test. She denies recent use today (02/16/21)   6. Left AKA, has prosthesis; Right LE edema -continues to have mild right LE edema.     PLAN: -iv magnesium -stop oral kcl  -labs monthly x3 -f/u in 3 months -I made urgent nephology referral early this week, will let my nurse to call and get her an appointment  -I will defer her PCP and nephrologist to manage her electrolytes issue    No problem-specific Assessment & Plan notes found for this encounter.   INTERVAL HISTORY:  [name] is here for a follow up of anemia. She was last seen by me on 01/19/21. She presents to the clinic alone. She reports she is tired today. No new  pain.  She notes she took gabapentin this morning.   All other systems were reviewed with the patient and are negative.  MEDICAL HISTORY:  Past Medical History:  Diagnosis Date   Allergic rhinitis    Anemia, iron deficiency 05/03/2011   2/2 GI AVMs - recieves iron infusions as well as periodic red blood cell transfusions   Anxiety    Arteriovenous malformation of  duodenum 02/25/2015   CARPAL TUNNEL RELEASE, HX OF 07/22/2008   Chest pain 10/28/2019   Colon polyps    Diabetes mellitus without complication (HCC)    type 2   Difficulty sleeping    takes trazadone for sleep   Diverticulosis    Fall 01/03/2020   GERD (gastroesophageal reflux disease)    Hepatitis C    in SVR with Harvoni 12/2014-02/2015.    History of hernia repair 08/18/2012   History of porphyria 11/30/2014   Porphyria cutanea tarda in setting of chronic Hepatitis C, frequent  IV iron infusions, and alcohol abuse    Hypertension    Hypocalcemia 10/28/2019   Hypokalemia 09/27/2014   Peripheral vascular disease (HCC)    Stroke (HCC) 2010   no residual deficits   Substance use disorder    cocaine   Tachycardia 07/10/2017   Trichomonas vaginitis 04/16/2019    SURGICAL HISTORY: Past Surgical History:  Procedure Laterality Date   ABDOMINAL HYSTERECTOMY     APPLICATION OF WOUND VAC N/A 06/21/2014   Procedure: APPLICATION OF WOUND VAC;  Surgeon: Abigail Miyamoto, MD;  Location: [facility];  Service: General;  Laterality: N/A;   CARPAL TUNNEL RELEASE     rt hand   COLONOSCOPY WITH PROPOFOL N/A 03/17/2018   Procedure: COLONOSCOPY WITH PROPOFOL;  Surgeon: Sherrilyn Rist, MD;  Location: [facility];  Service: Gastroenterology;  Laterality: N/A;   ENTEROSCOPY N/A 12/04/2012   Procedure: ENTEROSCOPY;  Surgeon: Theda Belfast, MD;  Location: [facility];  Service: Endoscopy;  Laterality: N/A;   ENTEROSCOPY N/A 12/18/2012   Procedure: ENTEROSCOPY;  Surgeon: Theda Belfast, MD;  Location: [facility];  Service: Endoscopy;  Laterality: N/A;   ENTEROSCOPY N/A 02/25/2015   Procedure: ENTEROSCOPY;  Surgeon: Louis Meckel, MD;  Location: [facility] ENDOSCOPY;  Service: Endoscopy;  Laterality: N/A;   ENTEROSCOPY N/A 10/29/2017   Procedure: ENTEROSCOPY;  Surgeon: Sherrilyn Rist, MD;  Location: [facility];  Service: Gastroenterology;  Laterality: N/A;   ENTEROSCOPY N/A 07/08/2020   Procedure: ENTEROSCOPY;   Surgeon: Rachael Fee, MD;  Location: [facility];  Service: Endoscopy;  Laterality: N/A;   ESOPHAGOGASTRODUODENOSCOPY  05/05/2011   Procedure: ESOPHAGOGASTRODUODENOSCOPY (EGD);  Surgeon: Erick Blinks, MD;  Location: [facility];  Service: Gastroenterology;  Laterality: N/A;  Dr. Rhea Belton will do procedure for Dr. [name] Saturday.   ESOPHAGOGASTRODUODENOSCOPY  05/08/2011   Procedure: ESOPHAGOGASTRODUODENOSCOPY (EGD);  Surgeon: Theda Belfast;  [facility]: [facility];  Service: Endoscopy;  Laterality: N/A;   ESOPHAGOGASTRODUODENOSCOPY  06/07/2011   Procedure: ESOPHAGOGASTRODUODENOSCOPY (EGD);  Surgeon: Theda Belfast, MD;  Location: [facility];  Service: Endoscopy;  Laterality: N/A;   ESOPHAGOGASTRODUODENOSCOPY  12/20/2011   Procedure: ESOPHAGOGASTRODUODENOSCOPY (EGD);  Surgeon: Theda Belfast, MD;  Location: [facility];  Service: Endoscopy;  Laterality: N/A;   EYE SURGERY Bilateral    cataracts   FLEXIBLE SIGMOIDOSCOPY  12/21/2011   Procedure: FLEXIBLE SIGMOIDOSCOPY;  Surgeon: Theda Belfast, MD;  [facility];  Service: Endoscopy;  Laterality: N/A;   HOT HEMOSTASIS  06/07/2011   Procedure: HOT HEMOSTASIS (ARGON PLASMA COAGULATION/BICAP);  Surgeon: Beryle Beams, MD;  Location: [facility];  Service: Endoscopy;  Laterality: N/A;   HOT HEMOSTASIS N/A 07/08/2020   Procedure: HOT HEMOSTASIS (ARGON PLASMA COAGULATION/BICAP);  Surgeon: Milus Banister, MD;  Location: [facility];  Service: Endoscopy;  Laterality: N/A;   INCISIONAL HERNIA REPAIR N/A 06/01/2014   Procedure: ATTEMPTED LAPAROSCOPIC AND OPEN INCISIONAL HERNIA REPAIR WITH MESH;  Surgeon: Coralie Keens, MD;  Location: [place];  Service: General;  Laterality: N/A;   INSERTION OF MESH N/A 06/01/2014   Procedure: INSERTION OF MESH;  Surgeon: Coralie Keens, MD;  Location: [place];  Service: General;  Laterality: N/A;   LAPAROTOMY  02/16/2012   Procedure: EXPLORATORY LAPAROTOMY;  Surgeon: Edward Jolly, MD;  Location: [facility];   Service: General;  Laterality: N/A;  oversewing of anastomotic leak and rigid sigmoidoscopy   LAPAROTOMY N/A 06/21/2014   Procedure: ABDOMINAL WOUND EXPLORATION;  Surgeon: Coralie Keens, MD;  Location: [place];  Service: General;  Laterality: N/A;   LEG AMPUTATION ABOVE KNEE     left   PARTIAL COLECTOMY  02/15/2012   Procedure: PARTIAL COLECTOMY;  Surgeon: Harl Bowie, MD;  [facility]: [facility];  Service: General;  Laterality: N/A;   Long INJECTION  07/08/2020   Procedure: SUBMUCOSAL INJECTION;  Surgeon: Milus Banister, MD;  Location: [facility];  Service: Endoscopy;;   TONSILLECTOMY      I have reviewed the social history and family history with the patient and they are unchanged from previous note.  ALLERGIES:  is allergic to ciprofloxacin, morphine and related, penicillins, codeine, lisinopril, and feraheme [ferumoxytol].  MEDICATIONS:  Current Outpatient Medications  Medication Sig Dispense Refill   ACCU-CHEK FASTCLIX LANCETS MISC Use to test blood glucose 3 times daily. 100 each 11   ACCU-CHEK GUIDE test strip USE TO TEST BLOOD SUGAR THREE TIMES A DAY 100 strip 3   amLODipine (NORVASC) 10 MG tablet Take 1 tablet (10 mg total) by mouth daily. 90 tablet 3   aspirin EC 81 MG tablet Take 1 tablet (81 mg total) by mouth daily. (Patient not taking: Reported on 01/20/2021) 30 tablet 1   Blood Glucose Monitoring Suppl (ACCU-CHEK GUIDE) w/Device KIT 1 each by Does not apply route 3 (three) times daily. The patient is insulin requiring, ICD 10 code E11.65. The patient tests 3 times a day. 1 kit 1   calcium-vitamin D (OSCAL WITH D) 500-200 MG-UNIT tablet Take 1 tablet by mouth 2 (two) times daily. 60 tablet 0   cetirizine (ZYRTEC) 10 MG tablet Take 1 tablet (10 mg total) by mouth daily. 30 tablet 0   colchicine 0.6 MG tablet Take 0.6 mg by mouth daily as needed.     Dulaglutide 0.75 MG/0.5ML SOPN INJECT 0.75 MG INTO THE SKIN ONCE A WEEK. (Patient taking differently: Inject 0.75 mg into  the skin once a week. Sunday) 6 mL 3   gabapentin (NEURONTIN) 400 MG capsule TAKE 1 CAPSULE BY MOUTH EVERY MORNING AND THEN TAKE 1 CAPSULE IN THE AFTERNOON; THEN 2 CAPSULES BY MOUTH BEFORE BEDTIME 120 capsule 1   losartan (COZAAR) 100 MG tablet TAKE 1 TABLET (100 MG TOTAL) BY MOUTH DAILY. (Patient taking differently: Take 100 mg by mouth daily.) 90 tablet 3   magnesium oxide (MAG-OX) 400 (240 Mg) MG tablet Take 1 tablet (400 mg total) by mouth daily. 30 tablet 0   metFORMIN (GLUCOPHAGE) 1000 MG tablet Take 1,000 mg by mouth 2 (two) times daily.     omeprazole (PRILOSEC) 20 MG capsule TAKE  1 CAPSULE BY MOUTH DAILY 30 capsule 2   potassium chloride SA (KLOR-CON) 20 MEQ tablet Take 1 tablet (20 mEq total) by mouth 2 (two) times daily for one week then once daily until finish 30 tablet 0   No current facility-administered medications for this visit.   Facility-Administered Medications Ordered in Other Visits  Medication Dose Route Frequency Provider Last Rate Last Admin   0.9 %  sodium chloride infusion   Intravenous Once Truitt Merle, MD       magnesium sulfate IVPB 4 g 100 mL  4 g Intravenous Once Truitt Merle, MD        PHYSICAL EXAMINATION: ECOG PERFORMANCE STATUS: 2 - Symptomatic, <50% confined to bed  Vitals:   02/16/21 0913  BP: 135/86  Pulse: 97  Resp: 18  Temp: 98.2 F (36.8 C)  SpO2: 95%   Wt Readings from Last 3 Encounters:  02/16/21 159 lb 12.8 oz (72.5 kg)  01/25/21 166 lb 4.8 oz (75.4 kg)  01/19/21 163 lb 6.4 oz (74.1 kg)     GENERAL:alert, no distress and comfortable SKIN: skin color normal, no rashes or significant lesions EYES: normal, Conjunctiva are pink and non-injected, sclera clear  NEURO: alert & oriented x 3 with fluent speech  LABORATORY DATA:  I have reviewed the data as listed CBC Latest Ref Rng & Units 02/16/2021 02/09/2021 02/03/2021  WBC 4.0 - 10.5 K/uL 6.5 7.6 9.3  Hemoglobin 12.0 - 15.0 g/dL 13.6 14.5 14.4  Hematocrit 36.0 - 46.0 % 40.1 42.5 42.4   Platelets 150 - 400 K/uL 284 323 294     CMP Latest Ref Rng & Units 02/16/2021 02/09/2021 02/03/2021  Glucose 70 - 99 mg/dL 77 122(H) 112(H)  BUN 8 - 23 mg/dL 29(H) 28(H) 15  Creatinine 0.44 - 1.00 mg/dL 1.66(H) 1.81(H) 1.47(H)  Sodium 135 - 145 mmol/L 140 139 138  Potassium 3.5 - 5.1 mmol/L 6.1(H) 4.2 4.6  Chloride 98 - 111 mmol/L 110 103 102  CO2 22 - 32 mmol/L 21(L) 23 24  Calcium 8.9 - 10.3 mg/dL 9.0 9.6 9.4  Total Protein 6.5 - 8.1 g/dL - - -  Total Bilirubin 0.3 - 1.2 mg/dL - - -  Alkaline Phos 38 - 126 U/L - - -  AST 15 - 41 U/L - - -  ALT 0 - 44 U/L - - -      RADIOGRAPHIC STUDIES: I have personally reviewed the radiological images as listed and agreed with the findings in the report. No results found.    No orders of the defined types were placed in this encounter.  All questions were answered. The patient knows to call the clinic with any problems, questions or concerns. No barriers to learning was detected. The total time spent in the appointment was 30 minutes.     Truitt Merle, MD 02/16/2021   I, Wilburn Mylar, am acting as scribe for Truitt Merle, MD.   I have reviewed the above documentation for accuracy and completeness, and I agree with the above.

## 2021-02-19 ENCOUNTER — Other Ambulatory Visit: Payer: Self-pay | Admitting: Hematology

## 2021-02-27 ENCOUNTER — Other Ambulatory Visit: Payer: Self-pay | Admitting: Orthopedic Surgery

## 2021-02-28 ENCOUNTER — Other Ambulatory Visit: Payer: Self-pay | Admitting: Orthopedic Surgery

## 2021-02-28 DIAGNOSIS — M19131 Post-traumatic osteoarthritis, right wrist: Secondary | ICD-10-CM

## 2021-03-08 ENCOUNTER — Other Ambulatory Visit (HOSPITAL_COMMUNITY): Payer: Self-pay

## 2021-03-08 MED FILL — Dulaglutide Soln Auto-injector 0.75 MG/0.5ML: SUBCUTANEOUS | 84 days supply | Qty: 6 | Fill #2 | Status: AC

## 2021-03-10 ENCOUNTER — Other Ambulatory Visit (HOSPITAL_COMMUNITY): Payer: Self-pay

## 2021-03-16 ENCOUNTER — Inpatient Hospital Stay: Payer: Medicare Other | Attending: Hematology

## 2021-03-16 ENCOUNTER — Other Ambulatory Visit: Payer: Self-pay

## 2021-03-16 DIAGNOSIS — D500 Iron deficiency anemia secondary to blood loss (chronic): Secondary | ICD-10-CM | POA: Diagnosis present

## 2021-03-16 DIAGNOSIS — Z8619 Personal history of other infectious and parasitic diseases: Secondary | ICD-10-CM

## 2021-03-16 LAB — CBC WITH DIFFERENTIAL (CANCER CENTER ONLY)
Abs Immature Granulocytes: 0.2 10*3/uL — ABNORMAL HIGH (ref 0.00–0.07)
Basophils Absolute: 0 10*3/uL (ref 0.0–0.1)
Basophils Relative: 0 %
Eosinophils Absolute: 0.2 10*3/uL (ref 0.0–0.5)
Eosinophils Relative: 2 %
HCT: 33 % — ABNORMAL LOW (ref 36.0–46.0)
Hemoglobin: 11.1 g/dL — ABNORMAL LOW (ref 12.0–15.0)
Immature Granulocytes: 2 %
Lymphocytes Relative: 8 %
Lymphs Abs: 0.9 10*3/uL (ref 0.7–4.0)
MCH: 31.6 pg (ref 26.0–34.0)
MCHC: 33.6 g/dL (ref 30.0–36.0)
MCV: 94 fL (ref 80.0–100.0)
Monocytes Absolute: 1.1 10*3/uL — ABNORMAL HIGH (ref 0.1–1.0)
Monocytes Relative: 10 %
Neutro Abs: 8.3 10*3/uL — ABNORMAL HIGH (ref 1.7–7.7)
Neutrophils Relative %: 78 %
Platelet Count: 305 10*3/uL (ref 150–400)
RBC: 3.51 MIL/uL — ABNORMAL LOW (ref 3.87–5.11)
RDW: 14 % (ref 11.5–15.5)
WBC Count: 10.7 10*3/uL — ABNORMAL HIGH (ref 4.0–10.5)
nRBC: 0 % (ref 0.0–0.2)

## 2021-03-16 LAB — IRON AND TIBC
Iron: 94 ug/dL (ref 41–142)
Saturation Ratios: 36 % (ref 21–57)
TIBC: 263 ug/dL (ref 236–444)
UIBC: 169 ug/dL (ref 120–384)

## 2021-03-16 LAB — FERRITIN: Ferritin: 445 ng/mL — ABNORMAL HIGH (ref 11–307)

## 2021-03-17 LAB — AFP TUMOR MARKER: AFP, Serum, Tumor Marker: 3.2 ng/mL (ref 0.0–9.2)

## 2021-03-23 LAB — HM DIABETES EYE EXAM

## 2021-03-24 ENCOUNTER — Other Ambulatory Visit: Payer: Self-pay

## 2021-03-24 ENCOUNTER — Ambulatory Visit
Admission: RE | Admit: 2021-03-24 | Discharge: 2021-03-24 | Disposition: A | Discharge status: Home / Self Care | Payer: Medicare Other | Source: Ambulatory Visit | Attending: Orthopedic Surgery | Admitting: Orthopedic Surgery

## 2021-03-24 DIAGNOSIS — M19131 Post-traumatic osteoarthritis, right wrist: Secondary | ICD-10-CM

## 2021-03-30 ENCOUNTER — Ambulatory Visit (INDEPENDENT_AMBULATORY_CARE_PROVIDER_SITE_OTHER): Payer: Medicare Other | Admitting: Internal Medicine

## 2021-03-30 VITALS — BP 156/77 | HR 89 | Temp 98.7°F | Ht 61.0 in | Wt 162.3 lb

## 2021-03-30 DIAGNOSIS — F1990 Other psychoactive substance use, unspecified, uncomplicated: Secondary | ICD-10-CM | POA: Diagnosis not present

## 2021-03-30 DIAGNOSIS — R808 Other proteinuria: Secondary | ICD-10-CM

## 2021-03-30 DIAGNOSIS — E1140 Type 2 diabetes mellitus with diabetic neuropathy, unspecified: Secondary | ICD-10-CM

## 2021-03-30 DIAGNOSIS — I10 Essential (primary) hypertension: Secondary | ICD-10-CM

## 2021-03-30 DIAGNOSIS — D500 Iron deficiency anemia secondary to blood loss (chronic): Secondary | ICD-10-CM

## 2021-03-30 DIAGNOSIS — E782 Mixed hyperlipidemia: Secondary | ICD-10-CM

## 2021-03-30 DIAGNOSIS — E876 Hypokalemia: Secondary | ICD-10-CM

## 2021-03-30 DIAGNOSIS — Z23 Encounter for immunization: Secondary | ICD-10-CM

## 2021-03-30 DIAGNOSIS — N179 Acute kidney failure, unspecified: Secondary | ICD-10-CM

## 2021-03-30 DIAGNOSIS — Z13820 Encounter for screening for osteoporosis: Secondary | ICD-10-CM

## 2021-03-30 LAB — POCT GLYCOSYLATED HEMOGLOBIN (HGB A1C): Hemoglobin A1C: 5 % (ref 4.0–5.6)

## 2021-03-30 LAB — GLUCOSE, CAPILLARY: Glucose-Capillary: 141 mg/dL — ABNORMAL HIGH (ref 70–99)

## 2021-03-30 MED ORDER — CARVEDILOL 3.125 MG PO TABS: 3.1250 mg | ORAL_TABLET | Freq: Two times a day (BID) | ORAL | 2 refills | Status: DC

## 2021-03-30 MED ORDER — ROSUVASTATIN CALCIUM 20 MG PO TABS: 20.0000 mg | ORAL_TABLET | Freq: Every day | ORAL | 1 refills | Status: DC

## 2021-03-30 MED ORDER — MAGNESIUM OXIDE -MG SUPPLEMENT 400 (240 MG) MG PO TABS: 400.0000 mg | ORAL_TABLET | Freq: Every day | ORAL | 5 refills | Status: DC

## 2021-03-30 MED ORDER — FAMOTIDINE 20 MG PO TABS: 20.0000 mg | ORAL_TABLET | Freq: Two times a day (BID) | ORAL | 1 refills | Status: DC

## 2021-03-30 NOTE — Patient Instructions (Addendum)
It was nice seeing you today! Thank you for choosing [facility] for your Primary Care.    Today we talked about:   Diabetes: Continue with Metformin and Trulicity.   High blood pressure: Your pressure is a bit high. We may need to add a new medication in the future to help this.   Diarrhea: Avoid coffee if you stomach is upset!   Acid Reflux: I recommend Pepcid instead of Omeprazole. You can take this two times per day as needed.   I have [facility] This is to screen for osteoporosis (softening of the bone).   Keep working on avoiding alcohol and other substances!

## 2021-03-31 ENCOUNTER — Encounter: Payer: Self-pay | Admitting: Hematology

## 2021-03-31 LAB — MICROALBUMIN / CREATININE URINE RATIO
Creatinine, Urine: 137.7 mg/dL
Microalb/Creat Ratio: 3834 mg/g creat — ABNORMAL HIGH (ref 0–29)
Microalbumin, Urine: 5279.7 ug/mL

## 2021-03-31 LAB — BMP8+ANION GAP
Anion Gap: 17 mmol/L (ref 10.0–18.0)
BUN/Creatinine Ratio: 12 (ref 12–28)
BUN: 13 mg/dL (ref 8–27)
CO2: 23 mmol/L (ref 20–29)
Calcium: 8.9 mg/dL (ref 8.7–10.3)
Chloride: 101 mmol/L (ref 96–106)
Creatinine, Ser: 1.09 mg/dL — ABNORMAL HIGH (ref 0.57–1.00)
Glucose: 126 mg/dL — ABNORMAL HIGH (ref 70–99)
Potassium: 3.7 mmol/L (ref 3.5–5.2)
Sodium: 141 mmol/L (ref 134–144)
eGFR: 55 mL/min/{1.73_m2} — ABNORMAL LOW (ref >59)

## 2021-03-31 LAB — MAGNESIUM: Magnesium: 1.4 mg/dL — ABNORMAL LOW (ref 1.6–2.3)

## 2021-03-31 NOTE — Progress Notes (Signed)
CC: T2DM; HTN; electrolyte abnormalities   HPI:  [name] is a 70 y.o. with a PMHx as listed below who presents to the clinic for T2DM; HTN; electrolyte abnormalities .   Please see the Encounters tab for problem-based Assessment & Plan regarding status of patient's acute and chronic conditions.  Past Medical History:  Diagnosis Date   Allergic rhinitis    Anemia, iron deficiency 05/03/2011   2/2 GI AVMs - recieves iron infusions as well as periodic red blood cell transfusions   Anxiety    Arteriovenous malformation of duodenum 02/25/2015   CARPAL TUNNEL RELEASE, HX OF 07/22/2008   Chest pain 10/28/2019   Colon polyps    Diabetes mellitus without complication ([place])    type 2   Difficulty sleeping    takes trazadone for sleep   Diverticulosis    Fall 01/03/2020   GERD (gastroesophageal reflux disease)    Hepatitis C    in SVR with Harvoni 12/2014-02/2015.    History of hernia repair 08/18/2012   History of porphyria 11/30/2014   Porphyria cutanea tarda in setting of chronic Hepatitis C, frequent  IV iron infusions, and alcohol abuse    Hypertension    Hypocalcemia 10/28/2019   Hypokalemia 09/27/2014   Peripheral vascular disease ([place])    Stroke ([place]) 2010   no residual deficits   Substance use disorder    cocaine   Tachycardia 07/10/2017   Trichomonas vaginitis 04/16/2019   Review of Systems: Review of Systems  Constitutional:  Negative for chills, fever, malaise/fatigue and weight loss.  Respiratory:  Negative for cough, shortness of breath and wheezing.   Cardiovascular:  Negative for chest pain and leg swelling.  Gastrointestinal:  Negative for abdominal pain, blood in stool, melena, nausea and vomiting.  Genitourinary:  Negative for dysuria, frequency, hematuria and urgency.  Skin:  Negative for itching and rash.  Neurological:  Negative for dizziness, focal weakness, weakness and headaches.  Psychiatric/Behavioral:  Negative for depression and substance abuse.     Physical Exam:  Vitals:   03/30/21 0951 03/30/21 1101  BP: (!) 150/76 (!) 156/77  Pulse: 93 89  Temp: 98.7 F (37.1 C)   TempSrc: Oral   SpO2: 100%   Weight: 162 lb 4.8 oz (73.6 kg)   Height: 5\' 1"  (1.549 m)    Physical Exam Vitals and nursing note reviewed.  Constitutional:      General: She is not in acute distress.    Appearance: She is normal weight.  HENT:     Head: Normocephalic and atraumatic.  Eyes:     Extraocular Movements: Extraocular movements intact.     Conjunctiva/sclera: Conjunctivae normal.     Pupils: Pupils are equal, round, and reactive to light.  Cardiovascular:     Rate and Rhythm: Normal rate and regular rhythm.     Heart sounds: Murmur (descresendo systolic murmur, 2/6) heard.  Pulmonary:     Effort: Pulmonary effort is normal. No respiratory distress.     Breath sounds: No wheezing, rhonchi or rales.  Musculoskeletal:     Right lower leg: No edema.     Left Lower Extremity: Left leg is amputated above knee.  Skin:    General: Skin is warm and dry.  Neurological:     General: No focal deficit present.     Mental Status: She is alert and oriented to person, place, and time. Mental status is at baseline.  Psychiatric:        Mood and Affect: Mood  normal.        Behavior: Behavior normal.        Thought Content: Thought content normal.        Judgment: Judgment normal.    Assessment & Plan:   See Encounters Tab for problem based charting.  Patient discussed with Dr. Evette Doffing

## 2021-03-31 NOTE — Assessment & Plan Note (Signed)
BP: (!) 156/77  [name] states she continues to take amlodipine olmesartan daily without any difficulty.  She denies any chest pain, shortness of breath or lower extremity edema.  Assessment/plan: Blood pressure remains above goal.  Patient has had severe electrolyte derangements with chlorthalidone and HCTZ, so we will add on a beta-blocker.  Extensively counseled on the dangers of cocaine use while on a beta-blocker patient expressed understanding.  - Continue amlodipine at 10 mg daily losartan 100 mg daily - Start carvedilol 3.125 mg twice daily - We will follow-up patient's blood pressure at upcoming oncology appointment and adjust dose as needed

## 2021-03-31 NOTE — Assessment & Plan Note (Signed)
>>  ASSESSMENT AND PLAN FOR TYPE 2 DIABETES MELLITUS WITH DIABETIC NEUROPATHY (HCC) WRITTEN ON 03/31/2021  9:05 AM BY ARNETT SAUNDERS, MD  Lab Results  Component Value Date   HGBA1C 5.0 03/30/2021   [name] states that she is currently doing well on her Trulicity  and metformin .  She denies any concerns at this time.  She continues to check her CBGs twice daily but did not have her glucometer with her today.  Assessment/plan: A1c remains within goal.  Patient does have a history of frequent GI bleeds with transfusions that may affect her A1c, however I suspect even with correction for that she still remains well controlled.  - Continue Trulicity  and metformin  - Given long-term control diabetes, will space out A1c to every 6 months

## 2021-03-31 NOTE — Assessment & Plan Note (Signed)
[name] states that she has been working very hard on discontinuing substance abuse.  She has not used cocaine in a long time now and is only drinking 1-2 shots of liquor per week.  This is a significant improvement from prior when she was drinking 3-4 shots per day.  She finds that her boyfriend is a good support system and influence for her.  Assessment/plan: -Discussed extensively the importance of cessation given her multiple hospitalizations in the last couple months.  [name] expressed understanding and she will continue working on abstaining.

## 2021-03-31 NOTE — Assessment & Plan Note (Signed)
[name] has a history of hypomagnesemia in the setting of multiple electrolyte abnormalities.  There was concern long-term omeprazole use was exacerbating this and she was counseled to discontinue, however she notes today that she is still taking omeprazole daily. We discussed again the importance of stopping omeprazole and that we can replace it with Pepcid to provide her alternative relief of heart burn. She was also sent magnesium oxide to the pharmacy but never picked it up.  - Discontinue omeprazole - Start magnesium oxide 400 mg daily

## 2021-03-31 NOTE — Assessment & Plan Note (Addendum)
History of severe hypocalcemia leading to facial numbness and tingling.  Suspected to be secondary to substance abuse.  She continues to take daily calcium supplements combined with vitamin D.  No evidence of hypocalcemia on BMP today.  Repeat BMP at next visit

## 2021-03-31 NOTE — Assessment & Plan Note (Addendum)
Lab Results  Component Value Date   CHOL 195 11/17/2020   HDL 68 11/17/2020   LDLCALC 90 11/17/2020   TRIG 222 (H) 11/17/2020   CHOLHDL 2.9 11/17/2020   Patient has a previous history of CVA (seen only on imaging) and is not currently on statin therapy due to concern of its interaction with colchicine.  She is no longer taking colchicine except during acute flares of gout which has not happened at least a year. ASCVD risk calculated at 55.2%  - Start high intensity statin: Rosuvastatin 20 mg daily - LDL goal below 70

## 2021-03-31 NOTE — Assessment & Plan Note (Signed)
Lab Results  Component Value Date   HGBA1C 5.0 03/30/2021   [name] states that she is currently doing well on her Trulicity and metformin.  She denies any concerns at this time.  She continues to check her CBGs twice daily but did not have her glucometer with her today.  Assessment/plan: A1c remains within goal.  Patient does have a history of frequent GI bleeds with transfusions that may affect her A1c, however I suspect even with correction for that she still remains well controlled.  - Continue Trulicity and metformin - Given long-term control diabetes, will space out A1c to every 6 months

## 2021-03-31 NOTE — Assessment & Plan Note (Signed)
Resolved at this time.  Continue to monitor closely.

## 2021-03-31 NOTE — Assessment & Plan Note (Addendum)
Patient presented to the hospital with severe electrolyte derangements in the setting of substance abuse at which time she was also noted to have an AKI.  BMP rechecked today and shows improvement of creatinine from 1.6 to 1.09.  In the past, she has had these elevations before and seen nephrology felt there was some aspect of hypertensive nephrosclerosis.  Their note recommended quantifying proteinuria; we will do that today.  -Microalbumin/creatinine ratio still pending  ADDENDUM:  UACR markedly elevated compared to prior at 3.8 g.

## 2021-04-03 ENCOUNTER — Other Ambulatory Visit: Payer: Self-pay | Admitting: Orthopedic Surgery

## 2021-04-03 NOTE — Progress Notes (Signed)
Internal Medicine Clinic Attending  Case discussed with Dr. Basaraba  At the time of the visit.  We reviewed the resident's history and exam and pertinent patient test results.  I agree with the assessment, diagnosis, and plan of care documented in the resident's note.  

## 2021-04-04 ENCOUNTER — Other Ambulatory Visit: Payer: Self-pay | Admitting: Internal Medicine

## 2021-04-04 DIAGNOSIS — R809 Proteinuria, unspecified: Secondary | ICD-10-CM | POA: Insufficient documentation

## 2021-04-04 NOTE — Assessment & Plan Note (Signed)
UACR obtained and markedly elevated compared to prior (2015) at 3.8 g.   Patient has a history of CKD Stage 2; after consultation with Dr. Johnney Ou at [facility], it was suspected to be secondary to uncontrolled HTN leading to nephrosclerosis. No edema on examination concerning for anasarca. No other evidence of nephrotic syndrome on examination.   Will place referral to [facility] as her last visit was over 1 year ago. Patient is already on an ARB. SGLT2 inhibitors may be high risk due to history of prior leg amputation.

## 2021-04-04 NOTE — Addendum Note (Signed)
Addended by: Jose Persia on: 04/04/2021 10:21 AM   Modules accepted: Orders

## 2021-04-05 ENCOUNTER — Telehealth: Payer: Self-pay

## 2021-04-05 NOTE — Telephone Encounter (Signed)
Return pt's call. She has questions on how to take Famotidine and Carvedilol - explained to take 1 pill two times a day, one in the morning and 1 in the afternoon. Also Rosuvastatin - explained only once a day only. She repeated instructions with no questions.

## 2021-04-05 NOTE — Telephone Encounter (Signed)
Requesting to speak with a nurse about meds. Please call pt back.  

## 2021-04-06 ENCOUNTER — Encounter: Payer: Self-pay | Admitting: Dietician

## 2021-04-13 ENCOUNTER — Inpatient Hospital Stay: Payer: Medicare Other | Attending: Hematology

## 2021-04-13 ENCOUNTER — Encounter: Payer: Self-pay | Admitting: Internal Medicine

## 2021-04-13 ENCOUNTER — Other Ambulatory Visit: Payer: Self-pay

## 2021-04-13 DIAGNOSIS — D509 Iron deficiency anemia, unspecified: Secondary | ICD-10-CM | POA: Diagnosis not present

## 2021-04-13 DIAGNOSIS — D500 Iron deficiency anemia secondary to blood loss (chronic): Secondary | ICD-10-CM

## 2021-04-13 DIAGNOSIS — Z79899 Other long term (current) drug therapy: Secondary | ICD-10-CM | POA: Insufficient documentation

## 2021-04-13 LAB — CBC WITH DIFFERENTIAL (CANCER CENTER ONLY)
Abs Immature Granulocytes: 0.1 10*3/uL — ABNORMAL HIGH (ref 0.00–0.07)
Basophils Absolute: 0.1 10*3/uL (ref 0.0–0.1)
Basophils Relative: 1 %
Eosinophils Absolute: 0.2 10*3/uL (ref 0.0–0.5)
Eosinophils Relative: 2 %
HCT: 29.4 % — ABNORMAL LOW (ref 36.0–46.0)
Hemoglobin: 9.4 g/dL — ABNORMAL LOW (ref 12.0–15.0)
Immature Granulocytes: 1 %
Lymphocytes Relative: 10 %
Lymphs Abs: 1 10*3/uL (ref 0.7–4.0)
MCH: 32.4 pg (ref 26.0–34.0)
MCHC: 32 g/dL (ref 30.0–36.0)
MCV: 101.4 fL — ABNORMAL HIGH (ref 80.0–100.0)
Monocytes Absolute: 0.8 10*3/uL (ref 0.1–1.0)
Monocytes Relative: 8 %
Neutro Abs: 7.5 10*3/uL (ref 1.7–7.7)
Neutrophils Relative %: 78 %
Platelet Count: 334 10*3/uL (ref 150–400)
RBC: 2.9 MIL/uL — ABNORMAL LOW (ref 3.87–5.11)
RDW: 14.3 % (ref 11.5–15.5)
WBC Count: 9.6 10*3/uL (ref 4.0–10.5)
nRBC: 0 % (ref 0.0–0.2)

## 2021-04-13 LAB — IRON AND TIBC
Iron: 39 ug/dL — ABNORMAL LOW (ref 41–142)
Saturation Ratios: 12 % — ABNORMAL LOW (ref 21–57)
TIBC: 311 ug/dL (ref 236–444)
UIBC: 273 ug/dL (ref 120–384)

## 2021-04-13 LAB — FERRITIN: Ferritin: 188 ng/mL (ref 11–307)

## 2021-04-13 NOTE — Progress Notes (Signed)
Received pre-op clearance for patients upcoming right carpectomy on 12/01.  On chart review, no indications for aspirin 81 mg. Patient does not have history of PAD with normal ABIs in 2019 and in setting of prior GI bleed would like to discontinue this medication.  Called and spoke with patient to discontinue aspirin and not to take trulicity on morning of surgery.

## 2021-04-14 ENCOUNTER — Telehealth: Payer: Self-pay

## 2021-04-14 NOTE — Telephone Encounter (Signed)
-----   Message from Alla Feeling, NP sent at 04/13/2021 11:49 AM EST ----- Please let Ms. Locascio know she has recurrent iron deficiency and anemia. Please check for signs of bleeding. I recommend Feraheme weekly x2 and to restart avastin if urine protein has improved. I will send schedule message.    Thanks, Regan Rakers, NP

## 2021-04-14 NOTE — Telephone Encounter (Signed)
This nurse attempted to reach this patient to provide lab results and provider recommendations.  Patient did not answer home phone or mobile phone.  This nurse unable to leave a message.  Will attempt to reach patient again.  No further concerns at this time.

## 2021-04-16 ENCOUNTER — Other Ambulatory Visit: Payer: Self-pay | Admitting: Hematology

## 2021-04-19 ENCOUNTER — Encounter (HOSPITAL_BASED_OUTPATIENT_CLINIC_OR_DEPARTMENT_OTHER): Payer: Self-pay | Admitting: Orthopedic Surgery

## 2021-04-19 ENCOUNTER — Telehealth: Payer: Self-pay

## 2021-04-19 ENCOUNTER — Ambulatory Visit: Payer: Medicare Other

## 2021-04-19 NOTE — Telephone Encounter (Signed)
This nurse reached out to this patient related to missed infusion appointment this morning.  Patient stated that she did not know that she had an appointment scheduled today.  She states that she will need to have it rescheduled.  This nurse advised that scheduling would be notified that she needs to reschedule.  No further questions or concerns at this time.

## 2021-04-21 ENCOUNTER — Telehealth: Payer: Self-pay | Admitting: Hematology

## 2021-04-21 NOTE — Telephone Encounter (Signed)
Rescheduled appointment per 11/23 scheduling message. Patient is aware.

## 2021-04-24 ENCOUNTER — Other Ambulatory Visit (HOSPITAL_COMMUNITY): Payer: Self-pay

## 2021-04-24 ENCOUNTER — Encounter (HOSPITAL_BASED_OUTPATIENT_CLINIC_OR_DEPARTMENT_OTHER)
Admission: RE | Admit: 2021-04-24 | Discharge: 2021-04-24 | Disposition: A | Discharge status: Home / Self Care | Payer: Medicare Other | Source: Ambulatory Visit | Attending: Orthopedic Surgery | Admitting: Orthopedic Surgery

## 2021-04-24 ENCOUNTER — Encounter: Payer: Self-pay | Admitting: Hematology

## 2021-04-24 DIAGNOSIS — D500 Iron deficiency anemia secondary to blood loss (chronic): Secondary | ICD-10-CM | POA: Diagnosis not present

## 2021-04-24 DIAGNOSIS — Z01812 Encounter for preprocedural laboratory examination: Secondary | ICD-10-CM | POA: Diagnosis present

## 2021-04-24 DIAGNOSIS — E1140 Type 2 diabetes mellitus with diabetic neuropathy, unspecified: Secondary | ICD-10-CM | POA: Diagnosis not present

## 2021-04-24 LAB — BASIC METABOLIC PANEL
Anion gap: 8 (ref 5–15)
BUN: 32 mg/dL — ABNORMAL HIGH (ref 8–23)
CO2: 25 mmol/L (ref 22–32)
Calcium: 9 mg/dL (ref 8.9–10.3)
Chloride: 105 mmol/L (ref 98–111)
Creatinine, Ser: 1.61 mg/dL — ABNORMAL HIGH (ref 0.44–1.00)
GFR, Estimated: 35 mL/min — ABNORMAL LOW (ref >60)
Glucose, Bld: 157 mg/dL — ABNORMAL HIGH (ref 70–99)
Potassium: 4.4 mmol/L (ref 3.5–5.1)
Sodium: 138 mmol/L (ref 135–145)

## 2021-04-24 LAB — CBC
HCT: 31.3 % — ABNORMAL LOW (ref 36.0–46.0)
Hemoglobin: 10.2 g/dL — ABNORMAL LOW (ref 12.0–15.0)
MCH: 33 pg (ref 26.0–34.0)
MCHC: 32.6 g/dL (ref 30.0–36.0)
MCV: 101.3 fL — ABNORMAL HIGH (ref 80.0–100.0)
Platelets: 346 10*3/uL (ref 150–400)
RBC: 3.09 MIL/uL — ABNORMAL LOW (ref 3.87–5.11)
RDW: 13.2 % (ref 11.5–15.5)
WBC: 5.8 10*3/uL (ref 4.0–10.5)
nRBC: 0 % (ref 0.0–0.2)

## 2021-04-24 MED ORDER — ERGOCALCIFEROL 1.25 MG (50000 UT) PO CAPS
ORAL_CAPSULE | ORAL | 3 refills | Status: DC
Start: 2021-04-24 — End: 2022-06-08
  Filled 2021-04-24: qty 8, 120d supply, fill #0

## 2021-04-24 NOTE — Progress Notes (Signed)

## 2021-04-25 ENCOUNTER — Other Ambulatory Visit: Payer: Self-pay

## 2021-04-25 ENCOUNTER — Inpatient Hospital Stay: Payer: Medicare Other

## 2021-04-25 VITALS — BP 125/77 | HR 82 | Temp 97.8°F | Resp 16 | Ht 61.0 in | Wt 157.0 lb

## 2021-04-25 DIAGNOSIS — D500 Iron deficiency anemia secondary to blood loss (chronic): Secondary | ICD-10-CM

## 2021-04-25 DIAGNOSIS — K922 Gastrointestinal hemorrhage, unspecified: Secondary | ICD-10-CM

## 2021-04-25 DIAGNOSIS — D509 Iron deficiency anemia, unspecified: Secondary | ICD-10-CM | POA: Diagnosis not present

## 2021-04-25 MED ORDER — FAMOTIDINE 20 MG PO TABS
20.0000 mg | ORAL_TABLET | Freq: Once | ORAL | Status: AC
  Administered 2021-04-25: 20 mg via ORAL

## 2021-04-25 MED ORDER — SODIUM CHLORIDE 0.9 % IV SOLN
510.0000 mg | Freq: Once | INTRAVENOUS | Status: AC
  Administered 2021-04-25: 510 mg via INTRAVENOUS
  Filled 2021-04-25: qty 510

## 2021-04-25 MED ORDER — DIPHENHYDRAMINE HCL 50 MG/ML IJ SOLN
50.0000 mg | Freq: Once | INTRAMUSCULAR | Status: AC
  Administered 2021-04-25: 50 mg via INTRAVENOUS

## 2021-04-25 NOTE — Progress Notes (Signed)
Patient is having surgery this week on her wrist. No Avastin today.

## 2021-04-25 NOTE — Patient Instructions (Signed)

## 2021-04-26 NOTE — Anesthesia Preprocedure Evaluation (Addendum)
Anesthesia Evaluation  Patient identified by MRN, date of birth, ID band Patient awake    Reviewed: Allergy & Precautions, NPO status , Patient's Chart, lab work & pertinent test results  Airway Mallampati: II  TM Distance: >3 FB Neck ROM: Full    Dental  (+) Edentulous Lower, Edentulous Upper   Pulmonary Current Smoker and Patient abstained from smoking.,    Pulmonary exam normal breath sounds clear to auscultation       Cardiovascular hypertension, + Peripheral Vascular Disease  Normal cardiovascular exam Rhythm:Regular Rate:Normal     Neuro/Psych PSYCHIATRIC DISORDERS Anxiety Depression CVA    GI/Hepatic GERD  ,(+)     substance abuse  alcohol use and marijuana use, Hepatitis -, CDuodenal AVMs   Endo/Other  diabetes  Renal/GU Renal InsufficiencyRenal disease     Musculoskeletal   Abdominal   Peds  Hematology  (+) anemia ,   Anesthesia Other Findings   Reproductive/Obstetrics                           Anesthesia Physical Anesthesia Plan  ASA: 3  Anesthesia Plan: Regional and MAC   Post-op Pain Management: Tylenol PO (pre-op)   Induction: Intravenous  PONV Risk Score and Plan: 1 and Propofol infusion, TIVA and Treatment may vary due to age or medical condition  Airway Management Planned: Natural Airway and Simple Face Mask  Additional Equipment:   Intra-op Plan:   Post-operative Plan:   Informed Consent: I have reviewed the patients History and Physical, chart, labs and discussed the procedure including the risks, benefits and alternatives for the proposed anesthesia with the patient or authorized representative who has indicated his/her understanding and acceptance.     Dental advisory given  Plan Discussed with: Anesthesiologist and CRNA  Anesthesia Plan Comments: (Supraclavicular block. Natural airway. Propofol gtt. Norton Blizzard, MD  )      Anesthesia Quick  Evaluation

## 2021-04-27 ENCOUNTER — Ambulatory Visit (HOSPITAL_BASED_OUTPATIENT_CLINIC_OR_DEPARTMENT_OTHER)
Admission: RE | Admit: 2021-04-27 | Discharge: 2021-04-27 | Disposition: A | Discharge status: Home / Self Care | Payer: Medicare Other | Attending: Orthopedic Surgery | Admitting: Orthopedic Surgery

## 2021-04-27 ENCOUNTER — Ambulatory Visit (HOSPITAL_BASED_OUTPATIENT_CLINIC_OR_DEPARTMENT_OTHER): Payer: Medicare Other | Admitting: Anesthesiology

## 2021-04-27 ENCOUNTER — Encounter (HOSPITAL_BASED_OUTPATIENT_CLINIC_OR_DEPARTMENT_OTHER)
Admission: RE | Disposition: A | Discharge status: Home / Self Care | Payer: Self-pay | Source: Home / Self Care | Attending: Orthopedic Surgery

## 2021-04-27 ENCOUNTER — Other Ambulatory Visit (HOSPITAL_COMMUNITY): Payer: Self-pay

## 2021-04-27 ENCOUNTER — Other Ambulatory Visit: Payer: Self-pay

## 2021-04-27 ENCOUNTER — Encounter (HOSPITAL_BASED_OUTPATIENT_CLINIC_OR_DEPARTMENT_OTHER): Payer: Self-pay | Admitting: Orthopedic Surgery

## 2021-04-27 ENCOUNTER — Ambulatory Visit (HOSPITAL_BASED_OUTPATIENT_CLINIC_OR_DEPARTMENT_OTHER): Payer: Medicare Other

## 2021-04-27 DIAGNOSIS — M25831 Other specified joint disorders, right wrist: Secondary | ICD-10-CM | POA: Diagnosis present

## 2021-04-27 DIAGNOSIS — F419 Anxiety disorder, unspecified: Secondary | ICD-10-CM | POA: Diagnosis not present

## 2021-04-27 DIAGNOSIS — B1920 Unspecified viral hepatitis C without hepatic coma: Secondary | ICD-10-CM | POA: Insufficient documentation

## 2021-04-27 DIAGNOSIS — F17210 Nicotine dependence, cigarettes, uncomplicated: Secondary | ICD-10-CM | POA: Diagnosis not present

## 2021-04-27 DIAGNOSIS — F32A Depression, unspecified: Secondary | ICD-10-CM | POA: Insufficient documentation

## 2021-04-27 DIAGNOSIS — K219 Gastro-esophageal reflux disease without esophagitis: Secondary | ICD-10-CM | POA: Diagnosis not present

## 2021-04-27 DIAGNOSIS — E1151 Type 2 diabetes mellitus with diabetic peripheral angiopathy without gangrene: Secondary | ICD-10-CM | POA: Diagnosis not present

## 2021-04-27 DIAGNOSIS — Q2739 Arteriovenous malformation, other site: Secondary | ICD-10-CM | POA: Insufficient documentation

## 2021-04-27 DIAGNOSIS — I10 Essential (primary) hypertension: Secondary | ICD-10-CM | POA: Insufficient documentation

## 2021-04-27 DIAGNOSIS — E1140 Type 2 diabetes mellitus with diabetic neuropathy, unspecified: Secondary | ICD-10-CM

## 2021-04-27 DIAGNOSIS — Z8673 Personal history of transient ischemic attack (TIA), and cerebral infarction without residual deficits: Secondary | ICD-10-CM | POA: Insufficient documentation

## 2021-04-27 DIAGNOSIS — D500 Iron deficiency anemia secondary to blood loss (chronic): Secondary | ICD-10-CM

## 2021-04-27 HISTORY — PX: WRIST FUSION: SHX839

## 2021-04-27 LAB — GLUCOSE, CAPILLARY
Glucose-Capillary: 98 mg/dL (ref 70–99)
Glucose-Capillary: 87 mg/dL (ref 70–99)

## 2021-04-27 SURGERY — FUSION, JOINT, WRIST
Anesthesia: Monitor Anesthesia Care | Site: Wrist | Laterality: Right

## 2021-04-27 MED ORDER — 0.9 % SODIUM CHLORIDE (POUR BTL) OPTIME
TOPICAL | Status: DC | PRN
  Administered 2021-04-27: 100 mL

## 2021-04-27 MED ORDER — ONDANSETRON HCL 4 MG/2ML IJ SOLN
INTRAMUSCULAR | Status: AC
  Filled 2021-04-27: qty 2

## 2021-04-27 MED ORDER — CLINDAMYCIN PHOSPHATE 900 MG/50ML IV SOLN
INTRAVENOUS | Status: AC
  Filled 2021-04-27: qty 50

## 2021-04-27 MED ORDER — ROPIVACAINE HCL 5 MG/ML IJ SOLN
INTRAMUSCULAR | Status: DC | PRN
  Administered 2021-04-27: 30 mL

## 2021-04-27 MED ORDER — OXYCODONE-ACETAMINOPHEN 5-325 MG PO TABS
1.0000 | ORAL_TABLET | Freq: Four times a day (QID) | ORAL | 0 refills | Status: DC | PRN
  Filled 2021-04-27: qty 30, 4d supply, fill #0

## 2021-04-27 MED ORDER — MIDAZOLAM HCL 2 MG/2ML IJ SOLN: 2.0000 mg | Freq: Once | INTRAMUSCULAR | Status: DC

## 2021-04-27 MED ORDER — PHENYLEPHRINE HCL (PRESSORS) 10 MG/ML IV SOLN
INTRAVENOUS | Status: DC | PRN
  Administered 2021-04-27: 80 ug via INTRAVENOUS
  Administered 2021-04-27 (×2): 40 ug via INTRAVENOUS

## 2021-04-27 MED ORDER — ACETAMINOPHEN 500 MG PO TABS
ORAL_TABLET | ORAL | Status: AC
  Filled 2021-04-27: qty 2

## 2021-04-27 MED ORDER — ACETAMINOPHEN 500 MG PO TABS
1000.0000 mg | ORAL_TABLET | Freq: Once | ORAL | Status: AC
  Administered 2021-04-27: 1000 mg via ORAL

## 2021-04-27 MED ORDER — FENTANYL CITRATE (PF) 100 MCG/2ML IJ SOLN
INTRAMUSCULAR | Status: AC
  Filled 2021-04-27: qty 2

## 2021-04-27 MED ORDER — FENTANYL CITRATE (PF) 100 MCG/2ML IJ SOLN: 100.0000 ug | Freq: Once | INTRAMUSCULAR | Status: DC

## 2021-04-27 MED ORDER — AMISULPRIDE (ANTIEMETIC) 5 MG/2ML IV SOLN: 10.0000 mg | Freq: Once | INTRAVENOUS | Status: DC | PRN

## 2021-04-27 MED ORDER — CLINDAMYCIN PHOSPHATE 900 MG/50ML IV SOLN
900.0000 mg | INTRAVENOUS | Status: AC
  Administered 2021-04-27: 900 mg via INTRAVENOUS

## 2021-04-27 MED ORDER — ONDANSETRON HCL 4 MG/2ML IJ SOLN: 4.0000 mg | Freq: Once | INTRAMUSCULAR | Status: DC | PRN

## 2021-04-27 MED ORDER — PROPOFOL 500 MG/50ML IV EMUL
INTRAVENOUS | Status: DC | PRN
  Administered 2021-04-27: 75 ug/kg/min via INTRAVENOUS

## 2021-04-27 MED ORDER — PROPOFOL 10 MG/ML IV BOLUS
INTRAVENOUS | Status: AC
  Filled 2021-04-27: qty 20

## 2021-04-27 MED ORDER — ONDANSETRON HCL 4 MG/2ML IJ SOLN
INTRAMUSCULAR | Status: DC | PRN
  Administered 2021-04-27: 4 mg via INTRAVENOUS

## 2021-04-27 MED ORDER — MIDAZOLAM HCL 2 MG/2ML IJ SOLN
INTRAMUSCULAR | Status: AC
  Filled 2021-04-27: qty 2

## 2021-04-27 MED ORDER — MIDAZOLAM HCL 2 MG/2ML IJ SOLN
2.0000 mg | Freq: Once | INTRAMUSCULAR | Status: AC
  Administered 2021-04-27: 1 mg via INTRAVENOUS

## 2021-04-27 MED ORDER — FENTANYL CITRATE (PF) 100 MCG/2ML IJ SOLN: 25.0000 ug | INTRAMUSCULAR | Status: DC | PRN

## 2021-04-27 MED ORDER — LACTATED RINGERS IV SOLN: INTRAVENOUS | Status: DC

## 2021-04-27 MED ORDER — PROPOFOL 10 MG/ML IV BOLUS
INTRAVENOUS | Status: DC | PRN
  Administered 2021-04-27: 20 mg via INTRAVENOUS
  Administered 2021-04-27 (×3): 10 mg via INTRAVENOUS

## 2021-04-27 MED ORDER — FENTANYL CITRATE (PF) 100 MCG/2ML IJ SOLN
100.0000 ug | Freq: Once | INTRAMUSCULAR | Status: AC
  Administered 2021-04-27: 50 ug via INTRAVENOUS

## 2021-04-27 SURGICAL SUPPLY — 56 items
APL PRP STRL LF DISP 70% ISPRP (MISCELLANEOUS) ×2
BIT DRILL ACUTRAK MICRO 2 (BIT) ×2 IMPLANT
BLADE ARTHRO LOK 4 BEAVER (BLADE) ×3 IMPLANT
BLADE EAR TYMPAN 2.5 60D BEAV (BLADE) ×3 IMPLANT
BLADE MINI RND TIP GREEN BEAV (BLADE) ×3 IMPLANT
BLADE SURG 15 STRL LF DISP TIS (BLADE) ×4 IMPLANT
BLADE SURG 15 STRL SS (BLADE) ×6
BNDG CMPR 9X4 STRL LF SNTH (GAUZE/BANDAGES/DRESSINGS) ×2
BNDG ELASTIC 4X5.8 VLCR STR LF (GAUZE/BANDAGES/DRESSINGS) IMPLANT
BNDG ESMARK 4X9 LF (GAUZE/BANDAGES/DRESSINGS) ×3 IMPLANT
BNDG GAUZE ELAST 4 BULKY (GAUZE/BANDAGES/DRESSINGS) ×3 IMPLANT
CHLORAPREP W/TINT 26 (MISCELLANEOUS) ×3 IMPLANT
CORD BIPOLAR FORCEPS 12FT (ELECTRODE) ×3 IMPLANT
COVER BACK TABLE 60X90IN (DRAPES) ×3 IMPLANT
COVER MAYO STAND STRL (DRAPES) ×3 IMPLANT
CUFF TOURN SGL QUICK 18X4 (TOURNIQUET CUFF) IMPLANT
DECANTER SPIKE VIAL GLASS SM (MISCELLANEOUS) IMPLANT
DRAPE EXTREMITY T 121X128X90 (DISPOSABLE) ×3 IMPLANT
DRAPE OEC MINIVIEW 54X84 (DRAPES) ×3 IMPLANT
DRAPE SURG 17X23 STRL (DRAPES) ×3 IMPLANT
DRILL ACUTRAK MICRO 2 (BIT) ×3
GAUZE SPONGE 4X4 12PLY STRL (GAUZE/BANDAGES/DRESSINGS) ×3 IMPLANT
GAUZE XEROFORM 1X8 LF (GAUZE/BANDAGES/DRESSINGS) ×3 IMPLANT
GLOVE SRG 8 PF TXTR STRL LF DI (GLOVE) ×2 IMPLANT
GLOVE SURG ENC MOIS LTX SZ7.5 (GLOVE) ×3 IMPLANT
GLOVE SURG ORTHO LTX SZ8 (GLOVE) ×3 IMPLANT
GLOVE SURG UNDER POLY LF SZ8 (GLOVE) ×3
GLOVE SURG UNDER POLY LF SZ8.5 (GLOVE) ×3 IMPLANT
GOWN STRL REUS W/ TWL LRG LVL3 (GOWN DISPOSABLE) ×2 IMPLANT
GOWN STRL REUS W/TWL LRG LVL3 (GOWN DISPOSABLE) ×3
GOWN STRL REUS W/TWL XL LVL3 (GOWN DISPOSABLE) ×6 IMPLANT
GUIDEWIRE ORTHO MINI ACTK .045 (WIRE) ×6 IMPLANT
K-WIRE .035X4 (WIRE) ×3 IMPLANT
NS IRRIG 1000ML POUR BTL (IV SOLUTION) ×3 IMPLANT
PACK BASIN DAY SURGERY FS (CUSTOM PROCEDURE TRAY) ×3 IMPLANT
PAD CAST 4YDX4 CTTN HI CHSV (CAST SUPPLIES) ×2 IMPLANT
PADDING CAST ABS 3INX4YD NS (CAST SUPPLIES)
PADDING CAST ABS 4INX4YD NS (CAST SUPPLIES) ×1
PADDING CAST ABS COTTON 3X4 (CAST SUPPLIES) IMPLANT
PADDING CAST ABS COTTON 4X4 ST (CAST SUPPLIES) ×2 IMPLANT
PADDING CAST COTTON 4X4 STRL (CAST SUPPLIES) ×3
SCREW ACUTRAK 2 MINI 20MM (Screw) ×3 IMPLANT
SCREW ACUTRAK 2 MINI 22MM (Screw) ×3 IMPLANT
SCREW ACUTRAK 2 MINI 24MM (Screw) ×3 IMPLANT
SLEEVE SCD COMPRESS KNEE MED (STOCKING) ×3 IMPLANT
SPLINT PLASTER CAST XFAST 3X15 (CAST SUPPLIES) IMPLANT
SPLINT PLASTER XTRA FASTSET 3X (CAST SUPPLIES)
STOCKINETTE 4X48 STRL (DRAPES) ×3 IMPLANT
SUT ETHILON 4 0 PS 2 18 (SUTURE) ×3 IMPLANT
SUT VIC AB 2-0 SH 27 (SUTURE)
SUT VIC AB 2-0 SH 27XBRD (SUTURE) IMPLANT
SUT VICRYL 4-0 PS2 18IN ABS (SUTURE) ×3 IMPLANT
SYR BULB EAR ULCER 3OZ GRN STR (SYRINGE) ×3 IMPLANT
SYR CONTROL 10ML LL (SYRINGE) IMPLANT
TOWEL GREEN STERILE FF (TOWEL DISPOSABLE) ×3 IMPLANT
UNDERPAD 30X36 HEAVY ABSORB (UNDERPADS AND DIAPERS) ×3 IMPLANT

## 2021-04-27 NOTE — H&P (Signed)
[name] is an 70 y.o. female.   Chief Complaint: right wrist pain HPI: 70 yo female with right wrist pain.  Not improved with non operative measures.  She wishes to have right proximal row carpectomy vs scaphoid excision and four bone fusion.  Allergies:  Allergies  Allergen Reactions   Ciprofloxacin Hives and Swelling   Morphine And Related Other (See Comments)    Makes pt sad and paranoid   Penicillins Hives and Swelling    Has patient had a PCN reaction causing immediate rash, facial/tongue/throat swelling, SOB or lightheadedness with hypotension:YES Has patient had a PCN reaction causing severe rash involving mucus membranes or skin necrosis:UNSURE Has patient had a PCN reaction that required hospitalization:YES Has patient had a PCN reaction occurring within the last 10 years:No If all of the above answers are "NO", then may proceed with Cephalosporin use.    Codeine Hives and Swelling   Lisinopril Cough    New onset dry cough after starting lisinopril. Resolved upon switching to losartan.   Feraheme [Ferumoxytol] Itching    Tolerated Feraheme 6/26 & 7/21 pre-medications prior to medication    Past Medical History:  Diagnosis Date   Allergic rhinitis    Anemia, iron deficiency 05/03/2011   2/2 GI AVMs - recieves iron infusions as well as periodic red blood cell transfusions   Anxiety    Arteriovenous malformation of duodenum 02/25/2015   CARPAL TUNNEL RELEASE, HX OF 07/22/2008   Chest pain 10/28/2019   Colon polyps    Diabetes mellitus without complication ([place])    type 2   Difficulty sleeping    takes trazadone for sleep   Diverticulosis    Fall 01/03/2020   GERD (gastroesophageal reflux disease)    Hepatitis C    in SVR with Harvoni 12/2014-02/2015.    History of hernia repair 08/18/2012   History of porphyria 11/30/2014   Porphyria cutanea tarda in setting of chronic Hepatitis C, frequent  IV iron infusions, and alcohol abuse    Hypertension    Hypocalcemia  10/28/2019   Hypokalemia 09/27/2014   Peripheral vascular disease ([place])    Stroke ([place]) 2010   no residual deficits   Substance use disorder    cocaine   Tachycardia 07/10/2017   Trichomonas vaginitis 04/16/2019    Past Surgical History:  Procedure Laterality Date   ABDOMINAL HYSTERECTOMY     APPLICATION OF WOUND VAC N/A 06/21/2014   Procedure: APPLICATION OF WOUND VAC;  Surgeon: Coralie Keens, MD;  Location: [place];  Service: General;  Laterality: N/A;   CARPAL TUNNEL RELEASE     rt hand   COLONOSCOPY WITH PROPOFOL N/A 03/17/2018   Procedure: COLONOSCOPY WITH PROPOFOL;  Surgeon: Doran Stabler, MD;  Location: [facility];  Service: Gastroenterology;  Laterality: N/A;   ENTEROSCOPY N/A 12/04/2012   Procedure: ENTEROSCOPY;  Surgeon: Beryle Beams, MD;  Location: [facility];  Service: Endoscopy;  Laterality: N/A;   ENTEROSCOPY N/A 12/18/2012   Procedure: ENTEROSCOPY;  Surgeon: Beryle Beams, MD;  Location: [facility];  Service: Endoscopy;  Laterality: N/A;   ENTEROSCOPY N/A 02/25/2015   Procedure: ENTEROSCOPY;  Surgeon: Inda Castle, MD;  Location: [facility];  Service: Endoscopy;  Laterality: N/A;   ENTEROSCOPY N/A 10/29/2017   Procedure: ENTEROSCOPY;  Surgeon: Doran Stabler, MD;  Location: [facility];  Service: Gastroenterology;  Laterality: N/A;   ENTEROSCOPY N/A 07/08/2020   Procedure: ENTEROSCOPY;  Surgeon: Milus Banister, MD;  Location: [facility];  Service:  Endoscopy;  Laterality: N/A;   ESOPHAGOGASTRODUODENOSCOPY  05/05/2011   Procedure: ESOPHAGOGASTRODUODENOSCOPY (EGD);  Surgeon: Zenovia Jarred, MD;  Location: [facility];  Service: Gastroenterology;  Laterality: N/A;  Dr. Hilarie Fredrickson will do procedure for Dr. Benson Norway Saturday.   ESOPHAGOGASTRODUODENOSCOPY  05/08/2011   Procedure: ESOPHAGOGASTRODUODENOSCOPY (EGD);  Surgeon: Beryle Beams;  Location: [facility];  Service: Endoscopy;  Laterality: N/A;   ESOPHAGOGASTRODUODENOSCOPY  06/07/2011   Procedure:  ESOPHAGOGASTRODUODENOSCOPY (EGD);  Surgeon: Beryle Beams, MD;  Location: [facility];  Service: Endoscopy;  Laterality: N/A;   ESOPHAGOGASTRODUODENOSCOPY  12/20/2011   Procedure: ESOPHAGOGASTRODUODENOSCOPY (EGD);  Surgeon: Beryle Beams, MD;  Location: [facility];  Service: Endoscopy;  Laterality: N/A;   EYE SURGERY Bilateral    cataracts   FLEXIBLE SIGMOIDOSCOPY  12/21/2011   Procedure: FLEXIBLE SIGMOIDOSCOPY;  Surgeon: Beryle Beams, MD;  [facility]: [facility];  Service: Endoscopy;  Laterality: N/A;   HOT HEMOSTASIS  06/07/2011   Procedure: HOT HEMOSTASIS (ARGON PLASMA COAGULATION/BICAP);  Surgeon: Beryle Beams, MD;  Location: [facility];  Service: Endoscopy;  Laterality: N/A;   HOT HEMOSTASIS N/A 07/08/2020   Procedure: HOT HEMOSTASIS (ARGON PLASMA COAGULATION/BICAP);  Surgeon: Milus Banister, MD;  Location: [facility];  Service: Endoscopy;  Laterality: N/A;   INCISIONAL HERNIA REPAIR N/A 06/01/2014   Procedure: ATTEMPTED LAPAROSCOPIC AND OPEN INCISIONAL HERNIA REPAIR WITH MESH;  Surgeon: Coralie Keens, MD;  Location: [place];  Service: General;  Laterality: N/A;   INSERTION OF MESH N/A 06/01/2014   Procedure: INSERTION OF MESH;  Surgeon: Coralie Keens, MD;  Location: [place];  Service: General;  Laterality: N/A;   LAPAROTOMY  02/16/2012   Procedure: EXPLORATORY LAPAROTOMY;  Surgeon: Edward Jolly, MD;  Location: [facility];  Service: General;  Laterality: N/A;  oversewing of anastomotic leak and rigid sigmoidoscopy   LAPAROTOMY N/A 06/21/2014   Procedure: ABDOMINAL WOUND EXPLORATION;  Surgeon: Coralie Keens, MD;  Location: [place];  Service: General;  Laterality: N/A;   LEG AMPUTATION ABOVE KNEE     left   PARTIAL COLECTOMY  02/15/2012   Procedure: PARTIAL COLECTOMY;  Surgeon: Harl Bowie, MD;  Location: [facility];  Service: General;  Laterality: N/A;   SUBMUCOSAL INJECTION  07/08/2020   Procedure: SUBMUCOSAL INJECTION;  Surgeon: Milus Banister, MD;  Location: [facility];   Service: Endoscopy;;   TONSILLECTOMY      Family History: Family History  Problem Relation Age of Onset   Cancer Father        unknown   Hypertension Mother    Diverticulitis Mother     Social History:   reports that she has been smoking cigarettes. She has a 8.50 pack-year smoking history. She has never used smokeless tobacco. She reports current alcohol use. She reports current drug use. Drugs: Cocaine and Marijuana.  Medications: Medications Prior to Admission  Medication Sig Dispense Refill   amLODipine (NORVASC) 10 MG tablet Take 1 tablet (10 mg total) by mouth daily. 90 tablet 3   calcium-vitamin D (OSCAL WITH D) 500-200 MG-UNIT tablet Take 1 tablet by mouth 2 (two) times daily. 60 tablet 0   carvedilol (COREG) 3.125 MG tablet Take 1 tablet (3.125 mg total) by mouth 2 (two) times daily. 60 tablet 2   Dulaglutide 0.75 MG/0.5ML SOPN INJECT 0.75 MG INTO THE SKIN ONCE A WEEK. (Patient taking differently: Inject 0.75 mg into the skin once a week. Sunday) 6 mL 3   ergocalciferol (DRISDOL) 1.25 MG (50000 UT) capsule Take 1 capsule by mouth weekly for  4 weeks then 1 capsule monthly for 4 months 8 capsule 3   famotidine (PEPCID) 20 MG tablet Take 1 tablet (20 mg total) by mouth 2 (two) times daily. 60 tablet 1   gabapentin (NEURONTIN) 400 MG capsule TAKE 1 CAPSULE BY MOUTH EVERY MORNING AND THEN TAKE 1 CAPSULE IN THE AFTERNOON; THEN 2 CAPSULES BY MOUTH BEFORE BEDTIME 120 capsule 5   losartan (COZAAR) 100 MG tablet TAKE 1 TABLET (100 MG TOTAL) BY MOUTH DAILY. (Patient taking differently: Take 100 mg by mouth daily.) 90 tablet 3   magnesium oxide (MAG-OX) 400 (240 Mg) MG tablet Take 1 tablet (400 mg total) by mouth daily. 30 tablet 5   metFORMIN (GLUCOPHAGE) 1000 MG tablet Take 1,000 mg by mouth 2 (two) times daily.     rosuvastatin (CRESTOR) 20 MG tablet Take 1 tablet (20 mg total) by mouth daily. 90 tablet 1   ACCU-CHEK FASTCLIX LANCETS MISC Use to test blood glucose 3 times daily. 100  each 11   ACCU-CHEK GUIDE test strip USE TO TEST BLOOD SUGAR THREE TIMES A DAY 100 strip 3   Blood Glucose Monitoring Suppl (ACCU-CHEK GUIDE) w/Device KIT 1 each by Does not apply route 3 (three) times daily. The patient is insulin requiring, ICD 10 code E11.65. The patient tests 3 times a day. 1 kit 1   colchicine 0.6 MG tablet Take 0.6 mg by mouth daily as needed.      Results for orders placed or performed during the hospital encounter of 04/27/21 (from the past 48 hour(s))  Glucose, capillary     Status: None   Collection Time: 04/27/21  8:14 AM  Result Value Ref Range   Glucose-Capillary 98 70 - 99 mg/dL    Comment: Glucose reference range applies only to samples taken after fasting for at least 8 hours.   *Note: Due to a large number of results and/or encounters for the requested time period, some results have not been displayed. A complete set of results can be found in Results Review.    DG MINI C-ARM IMAGE ONLY  Result Date: 04/27/2021 There is no interpretation for this exam.  This order is for images obtained during a surgical procedure.  Please See "Surgeries" Tab for more information regarding the procedure.      Blood pressure 109/90, pulse 100, temperature 97.6 F (36.4 C), temperature source Oral, resp. rate 18, height $RemoveBe'5\' 1"'FoKYqRvXp$  (1.549 m), weight 72.9 kg, SpO2 98 %.  General appearance: alert, cooperative, and appears stated age Head: Normocephalic, without obvious abnormality, atraumatic Neck: supple, symmetrical, trachea midline Cardio: regular rate and rhythm Resp: clear to auscultation bilaterally Extremities: Intact sensation and capillary refill all digits.  +epl/fpl/io.  No wounds.  Pulses: 2+ and symmetric Skin: Skin color, texture, turgor normal. No rashes or lesions Neurologic: Grossly normal Incision/Wound: none  Assessment/Plan Right wrist SLAC wrist.  Proximal row carpectomy vs scaphoid excision and four bone fusion.  Non operative and operative treatment  options have been discussed with the patient and patient wishes to proceed with operative treatment. Risks, benefits, and alternatives of surgery have been discussed and the patient agrees with the plan of care.   [name] 04/27/2021, 8:49 AM

## 2021-04-27 NOTE — Anesthesia Procedure Notes (Signed)
Anesthesia Regional Block: Supraclavicular block   Pre-Anesthetic Checklist: , timeout performed,  Correct Patient, Correct Site, Correct Laterality,  Correct Procedure, Correct Position, site marked,  Risks and benefits discussed,  Surgical consent,  Pre-op evaluation,  At surgeon's request and post-op pain management  Laterality: Right  Prep: chloraprep       Needles:  Injection technique: Single-shot  Needle Type: Echogenic Stimulator Needle     Needle Length: 10cm  Needle Gauge: 21     Additional Needles:   Procedures:,,,, ultrasound used (permanent image in chart),,    Narrative:  Start time: 04/27/2021 8:40 AM End time: 04/27/2021 8:45 AM Injection made incrementally with aspirations every 5 mL.  Performed by: Personally  Anesthesiologist: Merlinda Frederick, MD

## 2021-04-27 NOTE — Progress Notes (Signed)
Assisted Dr. Elgie Congo with right, ultrasound guided, supraclavicular block. Side rails up, monitors on throughout procedure. See vital signs in flow sheet. Tolerated Procedure well.

## 2021-04-27 NOTE — Transfer of Care (Signed)
Immediate Anesthesia Transfer of Care Note  Patient: [name]  Procedure(s) Performed: SCAPHOID EXCISION  WITH 4 BONE FUSION RIGHT WRIST (Right: Wrist)  Patient Location: PACU  Anesthesia Type:MAC combined with regional for post-op pain  Level of Consciousness: awake, alert  and oriented  Airway & Oxygen Therapy: Patient Spontanous Breathing and Patient connected to face mask oxygen  Post-op Assessment: Report given to RN and Post -op Vital signs reviewed and stable  Post vital signs: Reviewed and stable  Last Vitals:  Vitals Value Taken Time  BP 108/70 04/27/21 1149  Temp    Pulse 78 04/27/21 1153  Resp 13 04/27/21 1153  SpO2 96 % 04/27/21 1153  Vitals shown include unvalidated device data.  Last Pain:  Vitals:   04/27/21 0819  TempSrc: Oral  PainSc: 0-No pain         Complications: No notable events documented.

## 2021-04-27 NOTE — Discharge Instructions (Addendum)
[facility] Instructions Hand Surgery  Wound Care: Keep your hand elevated above the level of your heart.  Do not allow it to dangle by your side.  Keep the dressing dry and do not remove it unless your doctor advises you to do so.  He will usually change it at the time of your post-op visit.  Moving your fingers is advised to stimulate circulation but will depend on the site of your surgery.  If you have a splint applied, your doctor will advise you regarding movement.  Activity: Do not drive or operate machinery today.  Rest today and then you may return to your normal activity and work as indicated by your physician.  Diet:  Drink liquids today or eat a light diet.  You may resume a regular diet tomorrow.    General expectations: Pain for two to three days. Fingers may become slightly swollen.  Call your doctor if any of the following occur: Severe pain not relieved by pain medication. Elevated temperature. Dressing soaked with blood. Inability to move fingers.  Regional Anesthesia Blocks  1. Numbness or the inability to move the "blocked" extremity may last from 3-48 hours after placement. The length of time depends on the medication injected and your individual response to the medication. If the numbness is not going away after 48 hours, call your surgeon.  2. The extremity that is blocked will need to be protected until the numbness is gone and the  Strength has returned. Because you cannot feel it, you will need to take extra care to avoid injury. Because it may be weak, you may have difficulty moving it or using it. You may not know what position it is in without looking at it while the block is in effect.  3. For blocks in the legs and feet, returning to weight bearing and walking needs to be done carefully. You will need to wait until the numbness is entirely gone and the strength has returned. You should be able to move your leg and foot normally before you try and bear weight  or walk. You will need someone to be with you when you first try to ensure you do not fall and possibly risk injury.  4. Bruising and tenderness at the needle site are common side effects and will resolve in a few days.  5. Persistent numbness or new problems with movement should be communicated to the surgeon or the [facility] [phone] [place] [phone]).    White or bluish color to fingers.  Post Anesthesia Home Care Instructions  Activity: Get plenty of rest for the remainder of the day. A responsible individual must stay with you for 24 hours following the procedure.  For the next 24 hours, DO NOT: -Drive a car -Paediatric nurse -Drink alcoholic beverages -Take any medication unless instructed by your physician -Make any legal decisions or sign important papers.  Meals: Start with liquid foods such as gelatin or soup. Progress to regular foods as tolerated. Avoid greasy, spicy, heavy foods. If nausea and/or vomiting occur, drink only clear liquids until the nausea and/or vomiting subsides. Call your physician if vomiting continues.  Special Instructions/Symptoms: Your throat may feel dry or sore from the anesthesia or the breathing tube placed in your throat during surgery. If this causes discomfort, gargle with warm salt water. The discomfort should disappear within 24 hours.  If you had a scopolamine patch placed behind your ear for the management of post- operative nausea  and/or vomiting:  1. The medication in the patch is effective for 72 hours, after which it should be removed.  Wrap patch in a tissue and discard in the trash. Wash hands thoroughly with soap and water. 2. You may remove the patch earlier than 72 hours if you experience unpleasant side effects which may include dry mouth, dizziness or visual disturbances. 3. Avoid touching the patch. Wash your hands with soap and water after contact with the patch.   Next dose of  Tylenol at home after 14:25pm for pain as needed.

## 2021-04-27 NOTE — Anesthesia Postprocedure Evaluation (Signed)
Anesthesia Post Note  Patient: [name]  Procedure(s) Performed: SCAPHOID EXCISION  WITH 4 BONE FUSION RIGHT WRIST (Right: Wrist)     Patient location during evaluation: PACU Anesthesia Type: Regional and MAC Level of consciousness: awake and alert Pain management: pain level controlled Vital Signs Assessment: post-procedure vital signs reviewed and stable Respiratory status: spontaneous breathing and respiratory function stable Cardiovascular status: stable Postop Assessment: no apparent nausea or vomiting Anesthetic complications: no   No notable events documented.  Last Vitals:  Vitals:   04/27/21 1200 04/27/21 1215  BP: 125/71 127/75  Pulse: 70 74  Resp: 12 (!) 24  Temp:    SpO2: 92% 95%    Last Pain:  Vitals:   04/27/21 1149  TempSrc:   PainSc: 0-No pain                 Merlinda Frederick

## 2021-04-27 NOTE — Op Note (Signed)
NAME: [name] RECORD NO: [id number] DATE OF BIRTH: 09-17-1950 FACILITY: [facility] LOCATION: [facility] PHYSICIAN: Tennis Must, MD   OPERATIVE REPORT   DATE OF PROCEDURE: 04/27/21    PREOPERATIVE DIAGNOSIS: Right wrist scapholunate advanced collapse   POSTOPERATIVE DIAGNOSIS: Right wrist scapholunate advanced collapse   PROCEDURE: 1.  Right wrist scaphoid excision 2.  Right wrist ulnar for bone fusion 3.  Right wrist posterior interosseous nerve neurectomy for management of preoperative pain   SURGEON:  Leanora Cover, M.D.   ASSISTANT: Daryll Brod, MD   ANESTHESIA:  Regional with sedation   INTRAVENOUS FLUIDS:  Per anesthesia flow sheet.   ESTIMATED BLOOD LOSS:  Minimal.   COMPLICATIONS:  None.   SPECIMENS:  none   TOURNIQUET TIME:    Total Tourniquet Time Documented: Upper Arm (Right) - 84 minutes Total: Upper Arm (Right) - 84 minutes    DISPOSITION:  Stable to PACU.   INDICATIONS: 70 year old female with right wrist pain.  She is tried nonoperative measures for management of this.  She wishes to proceed with right wrist proximal row carpectomy versus scaphoid excision with 4 bone fusion risks, benefits and alternatives of surgery were discussed including the risks of blood loss, infection, damage to nerves, vessels, tendons, ligaments, bone for surgery, need for additional surgery, complications with wound healing, continued pain, stiffness.  She voiced understanding of these risks and elected to proceed.  OPERATIVE COURSE:  After being identified preoperatively by myself,  the patient and I agreed on the procedure and site of the procedure.  The surgical site was marked.  Surgical consent had been signed. She was given IV antibiotics as preoperative antibiotic prophylaxis. She was transferred to the operating room and placed on the operating table in supine position with the Right upper extremity on an arm board.  Sedation was induced by the  anesthesiologist. A regional block had been performed by anesthesia in preoperative holding.    Right upper extremity was prepped and draped in normal sterile orthopedic fashion.  A surgical pause was performed between the surgeons, anesthesia, and operating room staff and all were in agreement as to the patient, procedure, and site of procedure.  Tourniquet at the proximal aspect of the extremity was inflated to 250 mmHg after exsanguination of the arm with an Esmarch bandage.  Incision was made longitudinally over the dorsum of the wrist.  This was carried in subcutaneous tissues by spreading technique.  Bipolar electrocautery was used to obtain hemostasis.  The extensor retinaculum over the fourth dorsal compartment was opened.  The tendons were retracted.  The posterior interosseous nerve was identified and resected.  This was done for help with management of her pre-existing pain.  The capsule was opened along its proximal attachment and toward the radial side.  The lunate and capitate were identified.  The capitate was denuded of bone proximally.  Was felt that a scaphoid excision with 4 bone fusion was more appropriate.  The capsular flap was then made in a U-shaped with a distal base.  The scaphoid was freed up with soft tissue attachments with a knife.  The rondure was used to remove the scaphoid in its entirety.  The proximal pole of the capitate was then prepared using the rongeurs.  This was taken down to subchondral bone.  The proximal pole of the hamate was also taken down to subchondral bone.  The rongeurs were then used to prepare the distal aspect of the lunate and triquetrum taking  them down to subchondral bone as well.  The excised scaphoid bone was then morselized for use as bone graft.  The joint was copiously irrigated with sterile saline.  Bone graft was placed into the fusion site.  The lunate was reduced into appropriate alignment with the capitate and provisionally held with a 0.035 inch K  wire.  The AccuTrack mini screws were used.  The guidepin was placed from the lunate into the capitate.  This was checked with the C arm.  The proximal cortex was then opened and the screw placed after measuring.  Good purchase was obtained.  An additional screw was then placed from the triquetrum into the hamate.  The C-arm was used in AP and lateral projections.  The ulnar-sided screw was noted to be only into the ulnar edge of the hamate.  The screw was removed and the guidepin redirected toward the body of the hamate.  This was confirmed on C arm.  The screw was then replaced.  Good purchase was obtained.  It was felt that the initial screw from the lunate to the capitate was only catching the edge of the capitate.  An additional guidepin was placed through the lunate into the capitate and confirmed on C arm.  The cortex was then opened and and additional screw placed after measuring.  Good purchase was obtained.  The C-arm was used in AP lateral oblique projections to ensure appropriate reduction position of heart which was the case.  The wound was irrigated with sterile saline.  The capsule was then repaired using a 2-0 Vicryl suture.  The extensor retinaculum was repaired with a 2-0 Vicryl suture.  Skin was closed with 4-0 nylon in a horizontal mattress fashion.  The wound was dressed with sterile Xeroform 4 x 4's and wrapped with a Kerlix bandage.  A volar dorsal slab splint was placed and wrapped with Kerlix and Ace bandage.  The tourniquet was deflated at 84 minutes.  Fingertips were pink with brisk capillary refill after deflation of tourniquet.  The operative  drapes were broken down.  The patient was awoken from anesthesia safely.  She was transferred back to the stretcher and taken to PACU in stable condition.  I will see her back in the office in 1 week for postoperative followup.  I will give her a prescription for Percocet 5/325 1-2 tabs PO q6 hours prn pain, dispense # 30.   Leanora Cover,  MD Electronically signed, 04/27/21

## 2021-04-27 NOTE — Op Note (Signed)
I assisted Surgeon(s) and Role:    * Leanora Cover, MD - Primary    Daryll Brod, MD - Assisting on the Procedure(s): SCAPHOID EXCISION  WITH 4 BONE FUSION RIGHT WRIST on 04/27/2021.  I provided assistance on this case as follows: Set up, approach, isolation of the posterior interosseous nerve with resection, opening of the joint with visualization of the arthritis in the capitate and scaphoid removal of the scaphoid preparation of the capitate lunate and hamate triquetrum for fusion.  Morselization of the scaphoid for bone graft, placement of stabilization and guidepins for AccuTrack screws, placement of the graft and screws for 4 bone fusion, Lausier of the wound and application of the dressings and splints.  Electronically signed by: Daryll Brod, MD Date: 04/27/2021 Time: 11:56 AM

## 2021-05-02 ENCOUNTER — Other Ambulatory Visit (HOSPITAL_COMMUNITY): Payer: Self-pay

## 2021-05-03 ENCOUNTER — Encounter (HOSPITAL_BASED_OUTPATIENT_CLINIC_OR_DEPARTMENT_OTHER): Payer: Self-pay | Admitting: Orthopedic Surgery

## 2021-05-05 ENCOUNTER — Other Ambulatory Visit (HOSPITAL_COMMUNITY): Payer: Self-pay

## 2021-05-05 MED ORDER — OXYCODONE HCL 5 MG PO TABS
5.0000 mg | ORAL_TABLET | ORAL | 0 refills | Status: DC
  Filled 2021-05-05: qty 30, 7d supply, fill #0

## 2021-05-11 ENCOUNTER — Inpatient Hospital Stay: Payer: Medicare Other

## 2021-05-11 ENCOUNTER — Inpatient Hospital Stay (HOSPITAL_BASED_OUTPATIENT_CLINIC_OR_DEPARTMENT_OTHER): Payer: Medicare Other | Admitting: Hematology

## 2021-05-11 ENCOUNTER — Other Ambulatory Visit: Payer: Self-pay

## 2021-05-11 ENCOUNTER — Encounter: Payer: Self-pay | Admitting: Hematology

## 2021-05-11 ENCOUNTER — Inpatient Hospital Stay: Payer: Medicare Other | Attending: Hematology

## 2021-05-11 VITALS — BP 142/73 | HR 73 | Temp 98.3°F | Resp 16 | Ht 61.0 in | Wt 162.0 lb

## 2021-05-11 VITALS — BP 136/77 | HR 73 | Temp 98.0°F | Resp 16

## 2021-05-11 DIAGNOSIS — K922 Gastrointestinal hemorrhage, unspecified: Secondary | ICD-10-CM | POA: Insufficient documentation

## 2021-05-11 DIAGNOSIS — E119 Type 2 diabetes mellitus without complications: Secondary | ICD-10-CM | POA: Diagnosis not present

## 2021-05-11 DIAGNOSIS — D500 Iron deficiency anemia secondary to blood loss (chronic): Secondary | ICD-10-CM

## 2021-05-11 DIAGNOSIS — K219 Gastro-esophageal reflux disease without esophagitis: Secondary | ICD-10-CM | POA: Diagnosis not present

## 2021-05-11 DIAGNOSIS — Z79899 Other long term (current) drug therapy: Secondary | ICD-10-CM | POA: Diagnosis not present

## 2021-05-11 DIAGNOSIS — Z8619 Personal history of other infectious and parasitic diseases: Secondary | ICD-10-CM | POA: Diagnosis not present

## 2021-05-11 LAB — COMPREHENSIVE METABOLIC PANEL
ALT: 10 U/L (ref 0–44)
AST: 16 U/L (ref 15–41)
Albumin: 3.4 g/dL — ABNORMAL LOW (ref 3.5–5.0)
Alkaline Phosphatase: 55 U/L (ref 38–126)
Anion gap: 10 (ref 5–15)
BUN: 17 mg/dL (ref 8–23)
CO2: 23 mmol/L (ref 22–32)
Calcium: 8.8 mg/dL — ABNORMAL LOW (ref 8.9–10.3)
Chloride: 108 mmol/L (ref 98–111)
Creatinine, Ser: 1.11 mg/dL — ABNORMAL HIGH (ref 0.44–1.00)
GFR, Estimated: 54 mL/min — ABNORMAL LOW (ref >60)
Glucose, Bld: 106 mg/dL — ABNORMAL HIGH (ref 70–99)
Potassium: 4 mmol/L (ref 3.5–5.1)
Sodium: 141 mmol/L (ref 135–145)
Total Bilirubin: <0.2 mg/dL — ABNORMAL LOW (ref 0.3–1.2)
Total Protein: 6.6 g/dL (ref 6.5–8.1)

## 2021-05-11 LAB — CBC WITH DIFFERENTIAL (CANCER CENTER ONLY)
Abs Immature Granulocytes: 0.07 10*3/uL (ref 0.00–0.07)
Basophils Absolute: 0 10*3/uL (ref 0.0–0.1)
Basophils Relative: 1 %
Eosinophils Absolute: 0.2 10*3/uL (ref 0.0–0.5)
Eosinophils Relative: 2 %
HCT: 30.6 % — ABNORMAL LOW (ref 36.0–46.0)
Hemoglobin: 9.8 g/dL — ABNORMAL LOW (ref 12.0–15.0)
Immature Granulocytes: 1 %
Lymphocytes Relative: 14 %
Lymphs Abs: 1.1 10*3/uL (ref 0.7–4.0)
MCH: 31.9 pg (ref 26.0–34.0)
MCHC: 32 g/dL (ref 30.0–36.0)
MCV: 99.7 fL (ref 80.0–100.0)
Monocytes Absolute: 0.7 10*3/uL (ref 0.1–1.0)
Monocytes Relative: 10 %
Neutro Abs: 5.5 10*3/uL (ref 1.7–7.7)
Neutrophils Relative %: 72 %
Platelet Count: 392 10*3/uL (ref 150–400)
RBC: 3.07 MIL/uL — ABNORMAL LOW (ref 3.87–5.11)
RDW: 12.8 % (ref 11.5–15.5)
WBC Count: 7.5 10*3/uL (ref 4.0–10.5)
nRBC: 0 % (ref 0.0–0.2)

## 2021-05-11 LAB — TOTAL PROTEIN, URINE DIPSTICK: Protein, ur: 300 mg/dL — AB

## 2021-05-11 LAB — IRON AND TIBC
Iron: 41 ug/dL (ref 41–142)
Saturation Ratios: 14 % — ABNORMAL LOW (ref 21–57)
TIBC: 288 ug/dL (ref 236–444)
UIBC: 247 ug/dL (ref 120–384)

## 2021-05-11 LAB — FERRITIN: Ferritin: 275 ng/mL (ref 11–307)

## 2021-05-11 MED ORDER — DIPHENHYDRAMINE HCL 50 MG/ML IJ SOLN
50.0000 mg | Freq: Once | INTRAMUSCULAR | Status: AC
  Administered 2021-05-11: 50 mg via INTRAVENOUS
  Filled 2021-05-11: qty 1

## 2021-05-11 MED ORDER — SODIUM CHLORIDE 0.9 % IV SOLN: Freq: Once | INTRAVENOUS | Status: AC

## 2021-05-11 MED ORDER — SODIUM CHLORIDE 0.9 % IV SOLN
510.0000 mg | Freq: Once | INTRAVENOUS | Status: AC
  Administered 2021-05-11: 510 mg via INTRAVENOUS
  Filled 2021-05-11: qty 17

## 2021-05-11 MED ORDER — FAMOTIDINE 20 MG PO TABS
20.0000 mg | ORAL_TABLET | Freq: Once | ORAL | Status: AC
  Administered 2021-05-11: 20 mg via ORAL
  Filled 2021-05-11: qty 1

## 2021-05-11 NOTE — Progress Notes (Signed)
Per Dr. Burr Medico, hold Bevacizumab today for urine protein 300 and no Calcium Gluconate.

## 2021-05-11 NOTE — Patient Instructions (Signed)

## 2021-05-11 NOTE — Progress Notes (Signed)
[name]   [phone] [phone]   Clinic Follow up Note   Patient Care Team: Patricia Persia, MD as PCP - Patricia Field, MD as Consulting Physician (Ophthalmology) Patricia Keens, MD as Consulting Physician (General Surgery) Patricia Merle, MD as Consulting Physician (Hematology) [name], Patricia Regal, MD as Consulting Physician (Infectious Diseases) Patricia Carrow Kirke Corin, MD as Consulting Physician (Gastroenterology)  Date of Service:  05/11/2021  CHIEF COMPLAINT: f/u of anemia  CURRENT THERAPY:  -IV Feraheme as needed -Blood transfusions as needed, last 08/05/20  ASSESSMENT & PLAN:  [name] is a 70 y.o. female with   1. Iron deficiency anemia secondary to GI blood loss, and Anemia of Chronic Disease -She has a longstanding history of intermittent anemia dating back at least 2008 Hg was 10. - History of multiple GI bleeding in 2016, required blood transfusion, hospitalization and IV Feraheme -She had an EGD and colonoscopy on 10/29/2017 and 03/17/2018 with Dr. Loletha Carrow, which were normal. 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 -She has been on oral iron for 5-6 years now and has had intermittent black watery stool. -She has been receiving IV Feraheme every 1-2 month on average lately. Goal is to keep her Hg >10 and ferritin>100.  -She presented with acute GI bleeding in 06/2020, hospitalized and received 1 blood transfusion. Small bowel endoscopy [name] on 07/08/20 showed non-bleeding AVM. This was cauterized.  CT was negative for occult malignancy. -Due to recurrent anemia requiring IV Feraheme and blood transfusions, she started bevacizumab for GI bleeding on 08/10/20  -She responded well, anemia resolved since starting Beva, held after 2 doses due to increasing BP and proteinuria.  We have continued Feraheme infusions as needed    2. Hypocalcemia and hypomagnesia  -she was found to have calcium level of 5.8 on  01/16/21 when she presented to the ED for facial numbness. -this occurred again on 01/19/21, and she was referred to the ED and admitted. -she has previously received iv calcium and magnesium. -she notes she is scheduled for f/u with nephrology   3. Porphyria cutaneous tarda (PCT), resolved after Hep C Treatment    4. Comorbidities: DM2, Trouble sleeping, GERD, chronic Hep C infection, cocaine use -Management per PCP's office.  -GERD uncontrolled on Tums and omeprazole 20 mg once daily -positive for hep C in 2009, received treatment in 2016. 05/2018 liver US unremarkable -h/o cocaine use, most recently 01/16/21 on urine test. She denies recent use at visit on 02/16/21   5. Left AKA, has prosthesis; Right LE edema -continues to have mild right LE edema.     PLAN: -proceed with IV Feraheme today, hold beva due to high urine protein -repeat lab, iv feraheme and beva in 2 and 4 weeks -f/u in 4 weeks. If pt has worsening anemia despite adequate iv iron, may treat with beva even with high urine protein in future    No problem-specific Assessment & Plan notes found for this encounter.   INTERVAL HISTORY:  [name] is here for a follow up of anemia. She was last seen by me on 02/16/21. She was seen in the infusion area. She showed me the brace on her right wrist, reporting she went through surgery well.   All other systems were reviewed with the patient and are negative.  MEDICAL HISTORY:  Past Medical History:  Diagnosis Date   Allergic rhinitis    Anemia, iron deficiency 05/03/2011   2/2  GI AVMs - recieves iron infusions as well as periodic red blood cell transfusions   Anxiety    Arteriovenous malformation of duodenum 02/25/2015   CARPAL TUNNEL RELEASE, HX OF 07/22/2008   Chest pain 10/28/2019   Colon polyps    Diabetes mellitus without complication ([name])    type 2   Difficulty sleeping    takes trazadone for sleep   Diverticulosis    Fall 01/03/2020   GERD (gastroesophageal  reflux disease)    Hepatitis C    in SVR with Harvoni 12/2014-02/2015.    History of hernia repair 08/18/2012   History of porphyria 11/30/2014   Porphyria cutanea tarda in setting of chronic Hepatitis C, frequent  IV iron infusions, and alcohol abuse    Hypertension    Hypocalcemia 10/28/2019   Hypokalemia 09/27/2014   Peripheral vascular disease ([place])    Stroke ([place]) 2010   no residual deficits   Substance use disorder    cocaine   Tachycardia 07/10/2017   Trichomonas vaginitis 04/16/2019    SURGICAL HISTORY: Past Surgical History:  Procedure Laterality Date   ABDOMINAL HYSTERECTOMY     APPLICATION OF WOUND VAC N/A 06/21/2014   Procedure: APPLICATION OF WOUND VAC;  Surgeon: Patricia Keens, MD;  Location: [facility];  Service: General;  Laterality: N/A;   CARPAL TUNNEL RELEASE     rt hand   COLONOSCOPY WITH PROPOFOL N/A 03/17/2018   Procedure: COLONOSCOPY WITH PROPOFOL;  Surgeon: Doran Stabler, MD;  Location: [facility];  Service: Gastroenterology;  Laterality: N/A;   ENTEROSCOPY N/A 12/04/2012   Procedure: ENTEROSCOPY;  Surgeon: Beryle Beams, MD;  Location: [facility];  Service: Endoscopy;  Laterality: N/A;   ENTEROSCOPY N/A 12/18/2012   Procedure: ENTEROSCOPY;  Surgeon: Beryle Beams, MD;  [facility]: [facility];  Service: Endoscopy;  Laterality: N/A;   ENTEROSCOPY N/A 02/25/2015   Procedure: ENTEROSCOPY;  Surgeon: Inda Castle, MD;  [facility];  Service: Endoscopy;  Laterality: N/A;   ENTEROSCOPY N/A 10/29/2017   Procedure: ENTEROSCOPY;  Surgeon: Doran Stabler, MD;  [facility];  Service: Gastroenterology;  Laterality: N/A;   ENTEROSCOPY N/A 07/08/2020   Procedure: ENTEROSCOPY;  Surgeon: Milus Banister, MD;  [facility];  Service: Endoscopy;  Laterality: N/A;   ESOPHAGOGASTRODUODENOSCOPY  05/05/2011   Procedure: ESOPHAGOGASTRODUODENOSCOPY (EGD);  Surgeon: Zenovia Jarred, MD;  Location[facility];  Service: Gastroenterology;  Laterality:  N/A;  Dr. Hilarie Fredrickson will do procedure for Dr. Benson Norway Saturday.   ESOPHAGOGASTRODUODENOSCOPY  05/08/2011   Procedure: ESOPHAGOGASTRODUODENOSCOPY (EGD);  Surgeon: Beryle Beams;  [facility];  Service: Endoscopy;  Laterality: N/A;   ESOPHAGOGASTRODUODENOSCOPY  06/07/2011   Procedure: ESOPHAGOGASTRODUODENOSCOPY (EGD);  Surgeon: Beryle Beams, MD;  Location: [facility];  Service: Endoscopy;  Laterality: N/A;   ESOPHAGOGASTRODUODENOSCOPY  12/20/2011   Procedure: ESOPHAGOGASTRODUODENOSCOPY (EGD);  Surgeon: Beryle Beams, MD;  Location: [facility];  Service: Endoscopy;  Laterality: N/A;   EYE SURGERY Bilateral    cataracts   FLEXIBLE SIGMOIDOSCOPY  12/21/2011   Procedure: FLEXIBLE SIGMOIDOSCOPY;  Surgeon: Beryle Beams, MD;  [facility];  Service: Endoscopy;  Laterality: N/A;   HOT HEMOSTASIS  06/07/2011   Procedure: HOT HEMOSTASIS (ARGON PLASMA COAGULATION/BICAP);  Surgeon: Beryle Beams, MD;  Location: [facility];  Service: Endoscopy;  Laterality: N/A;   HOT HEMOSTASIS N/A 07/08/2020   Procedure: HOT HEMOSTASIS (ARGON PLASMA COAGULATION/BICAP);  Surgeon: Milus Banister, MD;  Location: [facility];  Service: Endoscopy;  Laterality: N/A;  INCISIONAL HERNIA REPAIR N/A 06/01/2014   Procedure: ATTEMPTED LAPAROSCOPIC AND OPEN INCISIONAL HERNIA REPAIR WITH MESH;  Surgeon: Patricia Keens, MD;  Location: [facility];  Service: General;  Laterality: N/A;   INSERTION OF MESH N/A 06/01/2014   Procedure: INSERTION OF MESH;  Surgeon: Patricia Keens, MD;  Location: [facility];  Service: General;  Laterality: N/A;   LAPAROTOMY  02/16/2012   Procedure: EXPLORATORY LAPAROTOMY;  Surgeon: Edward Jolly, MD;  [facility];  Service: General;  Laterality: N/A;  oversewing of anastomotic leak and rigid sigmoidoscopy   LAPAROTOMY N/A 06/21/2014   Procedure: ABDOMINAL WOUND EXPLORATION;  Surgeon: Patricia Keens, MD;  Location: [facility];  Service: General;  Laterality: N/A;   LEG AMPUTATION ABOVE KNEE      left   PARTIAL COLECTOMY  02/15/2012   Procedure: PARTIAL COLECTOMY;  Surgeon: Harl Bowie, MD;  [facility];  Service: General;  Laterality: N/A;   SUBMUCOSAL INJECTION  07/08/2020   Procedure: SUBMUCOSAL INJECTION;  Surgeon: Milus Banister, MD;  Location[facility];  Service: Endoscopy;;   TONSILLECTOMY     WRIST FUSION Right 04/27/2021   Procedure: SCAPHOID EXCISION  WITH 4 BONE FUSION RIGHT WRIST;  Surgeon: Leanora Cover, MD;  Location: [facility];  Service: Orthopedics;  Laterality: Right;    I have reviewed the social history and family history with the patient and they are unchanged from previous note.  ALLERGIES:  is allergic to ciprofloxacin, morphine and related, penicillins, codeine, lisinopril, and feraheme [ferumoxytol].  MEDICATIONS:  Current Outpatient Medications  Medication Sig Dispense Refill   ACCU-CHEK FASTCLIX LANCETS MISC Use to test blood glucose 3 times daily. 100 each 11   ACCU-CHEK GUIDE test strip USE TO TEST BLOOD SUGAR THREE TIMES A DAY 100 strip 3   amLODipine (NORVASC) 10 MG tablet Take 1 tablet (10 mg total) by mouth daily. 90 tablet 3   Blood Glucose Monitoring Suppl (ACCU-CHEK GUIDE) w/Device KIT 1 each by Does not apply route 3 (three) times daily. The patient is insulin requiring, ICD 10 code E11.65. The patient tests 3 times a day. 1 kit 1   calcium-vitamin D (OSCAL WITH D) 500-200 MG-UNIT tablet Take 1 tablet by mouth 2 (two) times daily. 60 tablet 0   carvedilol (COREG) 3.125 MG tablet Take 1 tablet (3.125 mg total) by mouth 2 (two) times daily. 60 tablet 2   colchicine 0.6 MG tablet Take 0.6 mg by mouth daily as needed.     Dulaglutide 0.75 MG/0.5ML SOPN INJECT 0.75 MG INTO THE SKIN ONCE A WEEK. (Patient taking differently: Inject 0.75 mg into the skin once a week. Sunday) 6 mL 3   ergocalciferol (DRISDOL) 1.25 MG (50000 UT) capsule Take 1 capsule by mouth weekly for 4 weeks then 1 capsule monthly for 4 months 8 capsule  3   famotidine (PEPCID) 20 MG tablet Take 1 tablet (20 mg total) by mouth 2 (two) times daily. 60 tablet 1   gabapentin (NEURONTIN) 400 MG capsule TAKE 1 CAPSULE BY MOUTH EVERY MORNING AND THEN TAKE 1 CAPSULE IN THE AFTERNOON; THEN 2 CAPSULES BY MOUTH BEFORE BEDTIME 120 capsule 5   losartan (COZAAR) 100 MG tablet TAKE 1 TABLET (100 MG TOTAL) BY MOUTH DAILY. (Patient taking differently: Take 100 mg by mouth daily.) 90 tablet 3   magnesium oxide (MAG-OX) 400 (240 Mg) MG tablet Take 1 tablet (400 mg total) by mouth daily. 30 tablet 5   metFORMIN (GLUCOPHAGE) 1000 MG tablet Take 1,000 mg  by mouth 2 (two) times daily.     oxyCODONE (OXY IR/ROXICODONE) 5 MG immediate release tablet Take 1 tablet (5 mg total) by mouth every 4 (four) hours as needed for up to 7 days. 30 tablet 0   oxyCODONE-acetaminophen (PERCOCET) 5-325 MG tablet Take 1-2 tablets by mouth every 6 (six) hours as needed for pain 30 tablet 0   rosuvastatin (CRESTOR) 20 MG tablet Take 1 tablet (20 mg total) by mouth daily. 90 tablet 1   No current facility-administered medications for this visit.    PHYSICAL EXAMINATION: ECOG PERFORMANCE STATUS: 2 - Symptomatic, <50% confined to bed  Vitals:   05/11/21 0933  BP: (!) 142/73  Pulse: 73  Resp: 16  Temp: 98.3 F (36.8 C)  SpO2: 100%   Wt Readings from Last 3 Encounters:  05/11/21 162 lb (73.5 kg)  04/27/21 160 lb 11.5 oz (72.9 kg)  04/25/21 157 lb (71.2 kg)     GENERAL:alert, no distress and comfortable SKIN: skin color normal, no rashes or significant lesions EYES: normal, Conjunctiva are pink and non-injected, sclera clear  NEURO: alert & oriented x 3 with fluent speech  LABORATORY DATA:  I have reviewed the data as listed CBC Latest Ref Rng & Units 05/11/2021 04/24/2021 04/13/2021  WBC 4.0 - 10.5 K/uL 7.5 5.8 9.6  Hemoglobin 12.0 - 15.0 g/dL 9.8(L) 10.2(L) 9.4(L)  Hematocrit 36.0 - 46.0 % 30.6(L) 31.3(L) 29.4(L)  Platelets 150 - 400 K/uL 392 346 334     CMP Latest  Ref Rng & Units 05/11/2021 04/24/2021 03/30/2021  Glucose 70 - 99 mg/dL 106(H) 157(H) 126(H)  BUN 8 - 23 mg/dL 17 32(H) 13  Creatinine 0.44 - 1.00 mg/dL 1.11(H) 1.61(H) 1.09(H)  Sodium 135 - 145 mmol/L 141 138 141  Potassium 3.5 - 5.1 mmol/L 4.0 4.4 3.7  Chloride 98 - 111 mmol/L 108 105 101  CO2 22 - 32 mmol/L _0 Calcium 8.9 - 10.3 mg/dL 8.8(L) 9.0 8.9  Total Protein 6.5 - 8.1 g/dL 6.6 - -  Total Bilirubin 0.3 - 1.2 mg/dL <0.2(L) - -  Alkaline Phos 38 - 126 U/L 55 - -  AST 15 - 41 U/L 16 - -  ALT 0 - 44 U/L 10 - -      RADIOGRAPHIC STUDIES: I have personally reviewed the radiological images as listed and agreed with the findings in the report. No results found.    Orders Placed This Encounter  Procedures   Total Protein, Urine dipstick    Standing Status:   Future    Number of Occurrences:   1    Standing Expiration Date:   05/11/2022   All questions were answered. The patient knows to call the clinic with any problems, questions or concerns. No barriers to learning was detected.      Patricia Merle, MD 05/11/2021   I, Wilburn Mylar, am acting as scribe for Patricia Merle, MD.   I have reviewed the above documentation for accuracy and completeness, and I agree with the above.

## 2021-05-16 ENCOUNTER — Telehealth: Payer: Self-pay | Admitting: Hematology

## 2021-05-16 NOTE — Telephone Encounter (Signed)
Scheduled follow-up appointments per 12/15 los. Patient is aware. Mailed calendar.

## 2021-05-17 ENCOUNTER — Other Ambulatory Visit (HOSPITAL_COMMUNITY): Payer: Self-pay

## 2021-05-18 ENCOUNTER — Other Ambulatory Visit (HOSPITAL_COMMUNITY): Payer: Self-pay

## 2021-05-19 ENCOUNTER — Other Ambulatory Visit (HOSPITAL_COMMUNITY): Payer: Self-pay

## 2021-05-23 ENCOUNTER — Other Ambulatory Visit (HOSPITAL_COMMUNITY): Payer: Self-pay

## 2021-05-23 MED ORDER — TRAMADOL HCL 50 MG PO TABS
50.0000 mg | ORAL_TABLET | Freq: Four times a day (QID) | ORAL | 0 refills | Status: DC | PRN
  Filled 2021-05-23: qty 30, 8d supply, fill #0

## 2021-05-26 ENCOUNTER — Inpatient Hospital Stay: Payer: Medicare Other

## 2021-05-26 ENCOUNTER — Other Ambulatory Visit: Payer: Self-pay

## 2021-05-26 VITALS — BP 124/75 | HR 72 | Temp 98.0°F | Resp 14

## 2021-05-26 DIAGNOSIS — D500 Iron deficiency anemia secondary to blood loss (chronic): Secondary | ICD-10-CM

## 2021-05-26 DIAGNOSIS — K922 Gastrointestinal hemorrhage, unspecified: Secondary | ICD-10-CM

## 2021-05-26 DIAGNOSIS — K31819 Angiodysplasia of stomach and duodenum without bleeding: Secondary | ICD-10-CM

## 2021-05-26 LAB — CBC WITH DIFFERENTIAL (CANCER CENTER ONLY)
Abs Immature Granulocytes: 0.03 10*3/uL (ref 0.00–0.07)
Basophils Absolute: 0 10*3/uL (ref 0.0–0.1)
Basophils Relative: 1 %
Eosinophils Absolute: 0.2 10*3/uL (ref 0.0–0.5)
Eosinophils Relative: 3 %
HCT: 31.5 % — ABNORMAL LOW (ref 36.0–46.0)
Hemoglobin: 10.5 g/dL — ABNORMAL LOW (ref 12.0–15.0)
Immature Granulocytes: 1 %
Lymphocytes Relative: 18 %
Lymphs Abs: 1.1 10*3/uL (ref 0.7–4.0)
MCH: 31.9 pg (ref 26.0–34.0)
MCHC: 33.3 g/dL (ref 30.0–36.0)
MCV: 95.7 fL (ref 80.0–100.0)
Monocytes Absolute: 0.6 10*3/uL (ref 0.1–1.0)
Monocytes Relative: 11 %
Neutro Abs: 3.9 10*3/uL (ref 1.7–7.7)
Neutrophils Relative %: 66 %
Platelet Count: 250 10*3/uL (ref 150–400)
RBC: 3.29 MIL/uL — ABNORMAL LOW (ref 3.87–5.11)
RDW: 12.5 % (ref 11.5–15.5)
WBC Count: 5.8 10*3/uL (ref 4.0–10.5)
nRBC: 0 % (ref 0.0–0.2)

## 2021-05-26 LAB — FERRITIN: Ferritin: 425 ng/mL — ABNORMAL HIGH (ref 11–307)

## 2021-05-26 LAB — TOTAL PROTEIN, URINE DIPSTICK: Protein, ur: 300 mg/dL — AB

## 2021-05-26 MED ORDER — SODIUM CHLORIDE 0.9 % IV SOLN
510.0000 mg | Freq: Once | INTRAVENOUS | Status: AC
  Administered 2021-05-26: 09:00:00 510 mg via INTRAVENOUS
  Filled 2021-05-26: qty 510

## 2021-05-26 MED ORDER — DIPHENHYDRAMINE HCL 50 MG/ML IJ SOLN
50.0000 mg | Freq: Once | INTRAMUSCULAR | Status: AC
  Administered 2021-05-26: 09:00:00 50 mg via INTRAVENOUS
  Filled 2021-05-26: qty 1

## 2021-05-26 MED ORDER — FAMOTIDINE 20 MG PO TABS
20.0000 mg | ORAL_TABLET | Freq: Once | ORAL | Status: AC
  Administered 2021-05-26: 09:00:00 20 mg via ORAL
  Filled 2021-05-26: qty 1

## 2021-05-26 NOTE — Progress Notes (Signed)
Per note from Dr Burr Medico on 05/11/21, give bevacizumab if worsening anemia. Hgb has improved since last draw. Urine protein remains at 300. Will hold bevacizumab today and give IV iron as ordered.

## 2021-05-26 NOTE — Patient Instructions (Signed)

## 2021-05-30 ENCOUNTER — Other Ambulatory Visit: Payer: Self-pay

## 2021-05-30 DIAGNOSIS — D500 Iron deficiency anemia secondary to blood loss (chronic): Secondary | ICD-10-CM

## 2021-06-01 ENCOUNTER — Other Ambulatory Visit: Payer: Self-pay | Admitting: Internal Medicine

## 2021-06-01 ENCOUNTER — Encounter: Payer: Self-pay | Admitting: Hematology

## 2021-06-01 NOTE — Progress Notes (Signed)
..  Patient Assist/Replace for the following has been terminated. Medication: Zirabev (bevacizumab-bvzr)  Reason for Termination: [employer] no longer is supply assistance for [place] unless specific criteria is met, which they determined she does not meet. Last DOS: none.  Marland KitchenJuan Quam, CPhT IV Drug Replacement Specialist [place] Phone: [phone]

## 2021-06-08 ENCOUNTER — Inpatient Hospital Stay: Payer: Medicare Other | Attending: Hematology

## 2021-06-08 ENCOUNTER — Inpatient Hospital Stay: Payer: Medicare Other

## 2021-06-08 ENCOUNTER — Other Ambulatory Visit: Payer: Self-pay

## 2021-06-08 ENCOUNTER — Inpatient Hospital Stay (HOSPITAL_BASED_OUTPATIENT_CLINIC_OR_DEPARTMENT_OTHER): Payer: Medicare Other | Admitting: Hematology

## 2021-06-08 ENCOUNTER — Encounter: Payer: Self-pay | Admitting: Hematology

## 2021-06-08 VITALS — BP 157/72 | HR 78 | Temp 98.5°F | Resp 16

## 2021-06-08 DIAGNOSIS — K922 Gastrointestinal hemorrhage, unspecified: Secondary | ICD-10-CM | POA: Insufficient documentation

## 2021-06-08 DIAGNOSIS — Z7985 Long-term (current) use of injectable non-insulin antidiabetic drugs: Secondary | ICD-10-CM | POA: Insufficient documentation

## 2021-06-08 DIAGNOSIS — D500 Iron deficiency anemia secondary to blood loss (chronic): Secondary | ICD-10-CM | POA: Diagnosis present

## 2021-06-08 DIAGNOSIS — Z89612 Acquired absence of left leg above knee: Secondary | ICD-10-CM | POA: Diagnosis not present

## 2021-06-08 DIAGNOSIS — Z5112 Encounter for antineoplastic immunotherapy: Secondary | ICD-10-CM | POA: Insufficient documentation

## 2021-06-08 DIAGNOSIS — Z79899 Other long term (current) drug therapy: Secondary | ICD-10-CM | POA: Diagnosis not present

## 2021-06-08 DIAGNOSIS — I10 Essential (primary) hypertension: Secondary | ICD-10-CM | POA: Insufficient documentation

## 2021-06-08 DIAGNOSIS — B182 Chronic viral hepatitis C: Secondary | ICD-10-CM | POA: Insufficient documentation

## 2021-06-08 DIAGNOSIS — K31819 Angiodysplasia of stomach and duodenum without bleeding: Secondary | ICD-10-CM

## 2021-06-08 DIAGNOSIS — Z7984 Long term (current) use of oral hypoglycemic drugs: Secondary | ICD-10-CM | POA: Insufficient documentation

## 2021-06-08 DIAGNOSIS — Z794 Long term (current) use of insulin: Secondary | ICD-10-CM | POA: Insufficient documentation

## 2021-06-08 DIAGNOSIS — E119 Type 2 diabetes mellitus without complications: Secondary | ICD-10-CM | POA: Insufficient documentation

## 2021-06-08 LAB — IRON AND IRON BINDING CAPACITY (CC-WL,HP ONLY)
Iron: 76 ug/dL (ref 28–170)
Saturation Ratios: 26 % (ref 10.4–31.8)
TIBC: 288 ug/dL (ref 250–450)
UIBC: 212 ug/dL (ref 148–442)

## 2021-06-08 LAB — CBC WITH DIFFERENTIAL (CANCER CENTER ONLY)
Abs Immature Granulocytes: 0.08 10*3/uL — ABNORMAL HIGH (ref 0.00–0.07)
Basophils Absolute: 0 10*3/uL (ref 0.0–0.1)
Basophils Relative: 1 %
Eosinophils Absolute: 0.1 10*3/uL (ref 0.0–0.5)
Eosinophils Relative: 2 %
HCT: 32.5 % — ABNORMAL LOW (ref 36.0–46.0)
Hemoglobin: 10.7 g/dL — ABNORMAL LOW (ref 12.0–15.0)
Immature Granulocytes: 1 %
Lymphocytes Relative: 18 %
Lymphs Abs: 1.2 10*3/uL (ref 0.7–4.0)
MCH: 32 pg (ref 26.0–34.0)
MCHC: 32.9 g/dL (ref 30.0–36.0)
MCV: 97.3 fL (ref 80.0–100.0)
Monocytes Absolute: 0.9 10*3/uL (ref 0.1–1.0)
Monocytes Relative: 13 %
Neutro Abs: 4.3 10*3/uL (ref 1.7–7.7)
Neutrophils Relative %: 65 %
Platelet Count: 269 10*3/uL (ref 150–400)
RBC: 3.34 MIL/uL — ABNORMAL LOW (ref 3.87–5.11)
RDW: 13.2 % (ref 11.5–15.5)
WBC Count: 6.6 10*3/uL (ref 4.0–10.5)
nRBC: 0 % (ref 0.0–0.2)

## 2021-06-08 LAB — FERRITIN: Ferritin: 569 ng/mL — ABNORMAL HIGH (ref 11–307)

## 2021-06-08 LAB — TOTAL PROTEIN, URINE DIPSTICK: Protein, ur: 300 mg/dL — AB

## 2021-06-08 MED ORDER — SODIUM CHLORIDE 0.9 % IV SOLN
5.0000 mg/kg | Freq: Once | INTRAVENOUS | Status: AC
  Administered 2021-06-08: 400 mg via INTRAVENOUS
  Filled 2021-06-08: qty 16

## 2021-06-08 MED ORDER — SODIUM CHLORIDE 0.9 % IV SOLN: Freq: Once | INTRAVENOUS | Status: AC

## 2021-06-08 NOTE — Patient Instructions (Signed)
[facility] MEDICAL [facility]COLOGY  ° Discharge Instructions: °Thank you for choosing [facility] to provide your oncology and hematology care.  ° °If you have a lab appointment with the Cancer Center, please go directly to the Cancer Center and check in at the registration area. °  °Wear comfortable clothing and clothing appropriate for easy access to any Portacath or PICC line.  ° °We strive to give you quality time with your provider. You may need to reschedule your appointment if you arrive late (15 or more minutes).  Arriving late affects you and other patients whose appointments are after yours.  Also, if you miss three or more appointments without notifying the office, you may be dismissed from the clinic at the provider’s discretion.    °  °For prescription refill requests, have your pharmacy contact our office and allow 72 hours for refills to be completed.   ° °Today you received the following chemotherapy and/or immunotherapy agents: bevacizumab-bvzr.    °  °To help prevent nausea and vomiting after your treatment, we encourage you to take your nausea medication as directed. ° °BELOW ARE SYMPTOMS THAT SHOULD BE REPORTED IMMEDIATELY: °*FEVER GREATER THAN 100.4 F (38 °C) OR HIGHER °*CHILLS OR SWEATING °*NAUSEA AND VOMITING THAT IS NOT CONTROLLED WITH YOUR NAUSEA MEDICATION °*UNUSUAL SHORTNESS OF BREATH °*UNUSUAL BRUISING OR BLEEDING °*URINARY PROBLEMS (pain or burning when urinating, or frequent urination) °*BOWEL PROBLEMS (unusual diarrhea, constipation, pain near the anus) °TENDERNESS IN MOUTH AND THROAT WITH OR WITHOUT PRESENCE OF ULCERS (sore throat, sores in mouth, or a toothache) °UNUSUAL RASH, SWELLING OR PAIN  °UNUSUAL VAGINAL DISCHARGE OR ITCHING  ° °Items with * indicate a potential emergency and should be followed up as soon as possible or go to the Emergency Department if any problems should occur. ° °Please show the CHEMOTHERAPY ALERT CARD or IMMUNOTHERAPY ALERT CARD at  check-in to the Emergency Department and triage nurse. ° °Should you have questions after your visit or need to cancel or reschedule your appointment, please contact [facility]  Dept: [phone]  and follow the prompts.  Office hours are 8:00 a.m. to 4:30 p.m. Monday - Friday. Please note that voicemails left after 4:00 p.m. may not be returned until the following business day.  We are closed weekends and major holidays. You have access to a nurse at all times for urgent questions. Please call the main number to the clinic Dept: [phone] and follow the prompts. ° ° °For any non-urgent questions, you may also contact your provider using MyChart. We now offer e-Visits for anyone 18 and older to request care online for non-urgent symptoms. For details visit [email or web address]. °  °Also download the MyChart app! Go to the app store, search "MyChart", open the app, select [place], and log in with your MyChart username and password. ° °Due to Covid, a mask is required upon entering the hospital/clinic. If you do not have a mask, one will be given to you upon arrival. For doctor visits, patients may have 1 support person aged 18 or older with them. For treatment visits, patients cannot have anyone with them due to current Covid guidelines and our immunocompromised population.  ° °
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
## 2021-06-08 NOTE — Progress Notes (Signed)
Proceed with Avastin only today. Ok to treat with elevated urine protein per Dr Burr Medico.

## 2021-06-20 ENCOUNTER — Other Ambulatory Visit (HOSPITAL_COMMUNITY): Payer: Self-pay

## 2021-06-20 MED FILL — Dulaglutide Soln Auto-injector 0.75 MG/0.5ML: SUBCUTANEOUS | 84 days supply | Qty: 6 | Fill #3 | Status: AC

## 2021-06-22 ENCOUNTER — Inpatient Hospital Stay: Payer: Medicare Other

## 2021-06-22 ENCOUNTER — Other Ambulatory Visit: Payer: Self-pay

## 2021-06-22 DIAGNOSIS — D500 Iron deficiency anemia secondary to blood loss (chronic): Secondary | ICD-10-CM

## 2021-06-22 DIAGNOSIS — Z5112 Encounter for antineoplastic immunotherapy: Secondary | ICD-10-CM | POA: Diagnosis not present

## 2021-06-22 DIAGNOSIS — K31819 Angiodysplasia of stomach and duodenum without bleeding: Secondary | ICD-10-CM

## 2021-06-22 LAB — IRON AND IRON BINDING CAPACITY (CC-WL,HP ONLY)
Iron: 79 ug/dL (ref 28–170)
Saturation Ratios: 27 % (ref 10.4–31.8)
TIBC: 290 ug/dL (ref 250–450)
UIBC: 211 ug/dL (ref 148–442)

## 2021-06-22 LAB — TOTAL PROTEIN, URINE DIPSTICK: Protein, ur: 300 mg/dL — AB

## 2021-06-22 LAB — FERRITIN: Ferritin: 465 ng/mL — ABNORMAL HIGH (ref 11–307)

## 2021-06-28 ENCOUNTER — Other Ambulatory Visit: Payer: Self-pay | Admitting: Internal Medicine

## 2021-06-28 DIAGNOSIS — I10 Essential (primary) hypertension: Secondary | ICD-10-CM

## 2021-06-29 ENCOUNTER — Encounter: Payer: Self-pay | Admitting: Hematology

## 2021-07-06 ENCOUNTER — Inpatient Hospital Stay: Payer: 59 | Attending: Hematology

## 2021-07-06 ENCOUNTER — Encounter: Payer: Self-pay | Admitting: Hematology

## 2021-07-06 ENCOUNTER — Other Ambulatory Visit: Payer: Self-pay

## 2021-07-06 DIAGNOSIS — K219 Gastro-esophageal reflux disease without esophagitis: Secondary | ICD-10-CM | POA: Insufficient documentation

## 2021-07-06 DIAGNOSIS — Z89612 Acquired absence of left leg above knee: Secondary | ICD-10-CM | POA: Diagnosis not present

## 2021-07-06 DIAGNOSIS — D638 Anemia in other chronic diseases classified elsewhere: Secondary | ICD-10-CM | POA: Diagnosis not present

## 2021-07-06 DIAGNOSIS — D500 Iron deficiency anemia secondary to blood loss (chronic): Secondary | ICD-10-CM | POA: Diagnosis not present

## 2021-07-06 DIAGNOSIS — R600 Localized edema: Secondary | ICD-10-CM | POA: Insufficient documentation

## 2021-07-06 DIAGNOSIS — E119 Type 2 diabetes mellitus without complications: Secondary | ICD-10-CM | POA: Diagnosis not present

## 2021-07-06 DIAGNOSIS — I10 Essential (primary) hypertension: Secondary | ICD-10-CM | POA: Diagnosis not present

## 2021-07-06 DIAGNOSIS — Z90710 Acquired absence of both cervix and uterus: Secondary | ICD-10-CM | POA: Insufficient documentation

## 2021-07-06 LAB — IRON AND IRON BINDING CAPACITY (CC-WL,HP ONLY)
Iron: 95 ug/dL (ref 28–170)
Saturation Ratios: 33 % — ABNORMAL HIGH (ref 10.4–31.8)
TIBC: 284 ug/dL (ref 250–450)
UIBC: 189 ug/dL (ref 148–442)

## 2021-07-06 LAB — FERRITIN: Ferritin: 411 ng/mL — ABNORMAL HIGH (ref 11–307)

## 2021-07-11 ENCOUNTER — Other Ambulatory Visit (HOSPITAL_COMMUNITY): Payer: Self-pay

## 2021-07-13 ENCOUNTER — Other Ambulatory Visit: Payer: Self-pay | Admitting: Internal Medicine

## 2021-07-13 ENCOUNTER — Encounter: Payer: Self-pay | Admitting: Hematology

## 2021-07-13 DIAGNOSIS — S78112A Complete traumatic amputation at level between left hip and knee, initial encounter: Secondary | ICD-10-CM

## 2021-07-19 ENCOUNTER — Other Ambulatory Visit: Payer: Self-pay

## 2021-07-19 DIAGNOSIS — D500 Iron deficiency anemia secondary to blood loss (chronic): Secondary | ICD-10-CM

## 2021-07-20 ENCOUNTER — Encounter: Payer: Self-pay | Admitting: Hematology

## 2021-07-20 ENCOUNTER — Inpatient Hospital Stay: Payer: 59

## 2021-07-20 ENCOUNTER — Other Ambulatory Visit: Payer: Self-pay

## 2021-07-20 ENCOUNTER — Inpatient Hospital Stay (HOSPITAL_BASED_OUTPATIENT_CLINIC_OR_DEPARTMENT_OTHER): Payer: 59 | Admitting: Hematology

## 2021-07-20 VITALS — BP 154/81 | HR 81 | Temp 98.5°F | Resp 18 | Ht 61.0 in | Wt 154.9 lb

## 2021-07-20 DIAGNOSIS — D500 Iron deficiency anemia secondary to blood loss (chronic): Secondary | ICD-10-CM | POA: Diagnosis not present

## 2021-07-20 DIAGNOSIS — Z8619 Personal history of other infectious and parasitic diseases: Secondary | ICD-10-CM | POA: Diagnosis not present

## 2021-07-20 LAB — CBC WITH DIFFERENTIAL (CANCER CENTER ONLY)
Abs Immature Granulocytes: 0.03 10*3/uL (ref 0.00–0.07)
Basophils Absolute: 0 10*3/uL (ref 0.0–0.1)
Basophils Relative: 1 %
Eosinophils Absolute: 0.1 10*3/uL (ref 0.0–0.5)
Eosinophils Relative: 2 %
HCT: 40 % (ref 36.0–46.0)
Hemoglobin: 13.6 g/dL (ref 12.0–15.0)
Immature Granulocytes: 1 %
Lymphocytes Relative: 20 %
Lymphs Abs: 1.3 10*3/uL (ref 0.7–4.0)
MCH: 31.6 pg (ref 26.0–34.0)
MCHC: 34 g/dL (ref 30.0–36.0)
MCV: 92.8 fL (ref 80.0–100.0)
Monocytes Absolute: 0.7 10*3/uL (ref 0.1–1.0)
Monocytes Relative: 11 %
Neutro Abs: 4.4 10*3/uL (ref 1.7–7.7)
Neutrophils Relative %: 65 %
Platelet Count: 247 10*3/uL (ref 150–400)
RBC: 4.31 MIL/uL (ref 3.87–5.11)
RDW: 12.9 % (ref 11.5–15.5)
WBC Count: 6.6 10*3/uL (ref 4.0–10.5)
nRBC: 0 % (ref 0.0–0.2)

## 2021-07-20 LAB — IRON AND IRON BINDING CAPACITY (CC-WL,HP ONLY)
Iron: 84 ug/dL (ref 28–170)
Saturation Ratios: 29 % (ref 10.4–31.8)
TIBC: 294 ug/dL (ref 250–450)
UIBC: 210 ug/dL (ref 148–442)

## 2021-07-20 LAB — FERRITIN: Ferritin: 411 ng/mL — ABNORMAL HIGH (ref 11–307)

## 2021-07-20 NOTE — Progress Notes (Signed)
[place]   Telephone:([phone]) [phone] [phone]   Clinic Follow up Note   Patient Care Team: Jose Persia, MD as PCP - Evalina Field, MD as Consulting Physician (Ophthalmology) Coralie Keens, MD as Consulting Physician (General Surgery) Truitt Merle, MD as Consulting Physician (Hematology) Comer, Okey Regal, MD as Consulting Physician (Infectious Diseases) Loletha Carrow Kirke Corin, MD as Consulting Physician (Gastroenterology)  Date of Service:  07/20/2021  CHIEF COMPLAINT: f/u of anemia  CURRENT THERAPY:  -IV Feraheme as needed -Blood transfusions as needed, last 08/05/20  ASSESSMENT & PLAN:  [name] is a 71 y.o. female with   1. Iron deficiency anemia secondary to GI blood loss, and Anemia of Chronic Disease -She has a longstanding history of intermittent anemia dating back at least 2008 Hg was 10. - History of multiple GI bleeding in 2016, required blood transfusion, hospitalization and IV Feraheme -She had an EGD and colonoscopy on 10/29/2017 and 03/17/2018 with Dr. Loletha Carrow, which were normal. 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 -Due to recurrent anemia requiring IV Feraheme and blood transfusions, she started bevacizumab for GI bleeding on 08/10/20. She responded very well with resolved anemia and less need for iv iron. This has been held intermittently due to elevated urine protein. -labs reviewed, her CBC is WNL today. Anemia again resolved after one dose beva last month.  -We will continue to monitor.   2. 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 Comorbidities: DM2, insomnia, GERD, chronic Hep C infection, cocaine use -Management per PCP's office.  -GERD uncontrolled on Tums and omeprazole 20 mg once daily -positive for hep C in 2009, received treatment in 2016. 05/2018 liver US unremarkable -h/o cocaine use. She denies recent use today (07/20/21)   5. Left AKA, has prosthesis;  Right LE edema -continues to have mild right LE edema.     PLAN: -lab every 3 weeks x4, will set up iv iron if ferritin<50 -will consider beva if she develops anemia again  -f/u in 12 weeks   No problem-specific Assessment & Plan notes found for this encounter.   INTERVAL HISTORY:  [name] is here for a follow up of anemia. She was last seen by me on 06/08/21. She presents to the clinic alone. She reports she had another death in her family recently. She told me last time that her niece was killed.  Now, her twin brother also passed away.   All other systems were reviewed with the patient and are negative.  MEDICAL HISTORY:  Past Medical History:  Diagnosis Date   Allergic rhinitis    Anemia, iron deficiency 05/03/2011   2/2 GI AVMs - recieves iron infusions as well as periodic red blood cell transfusions   Anxiety    Arteriovenous malformation of duodenum 02/25/2015   CARPAL TUNNEL RELEASE, HX OF 07/22/2008   Chest pain 10/28/2019   Colon polyps    Diabetes mellitus without complication ([place])    type 2   Difficulty sleeping    takes trazadone for sleep   Diverticulosis    Fall 01/03/2020   GERD (gastroesophageal reflux disease)    Hepatitis C    in SVR with Harvoni 12/2014-02/2015.    History of hernia repair 08/18/2012   History of porphyria 11/30/2014   Porphyria cutanea tarda in setting of chronic Hepatitis C, frequent  IV iron infusions, and alcohol abuse    Hypertension    Hypocalcemia  10/28/2019   Hypokalemia 09/27/2014   Peripheral vascular disease ([place])    Stroke ([place]) 2010   no residual deficits   Substance use disorder    cocaine   Tachycardia 07/10/2017   Trichomonas vaginitis 04/16/2019    SURGICAL HISTORY: Past Surgical History:  Procedure Laterality Date   ABDOMINAL HYSTERECTOMY     APPLICATION OF WOUND VAC N/A 06/21/2014   Procedure: APPLICATION OF WOUND VAC;  Surgeon: Coralie Keens, MD;  Location: [facility];  Service: General;  Laterality: N/A;    CARPAL TUNNEL RELEASE     rt hand   COLONOSCOPY WITH PROPOFOL N/A 03/17/2018   Procedure: COLONOSCOPY WITH PROPOFOL;  Surgeon: Doran Stabler, MD;  [facility]: [facility];  Service: Gastroenterology;  Laterality: N/A;   ENTEROSCOPY N/A 12/04/2012   Procedure: ENTEROSCOPY;  Surgeon: Beryle Beams, MD;  Location: [facility];  Service: Endoscopy;  Laterality: N/A;   ENTEROSCOPY N/A 12/18/2012   Procedure: ENTEROSCOPY;  Surgeon: Beryle Beams, MD;  [facility]: [facility];  Service: Endoscopy;  Laterality: N/A;   ENTEROSCOPY N/A 02/25/2015   Procedure: ENTEROSCOPY;  Surgeon: Inda Castle, MD;  [facility]: [facility];  Service: Endoscopy;  Laterality: N/A;   ENTEROSCOPY N/A 10/29/2017   Procedure: ENTEROSCOPY;  Surgeon: Doran Stabler, MD;  [facility];  Service: Gastroenterology;  Laterality: N/A;   ENTEROSCOPY N/A 07/08/2020   Procedure: ENTEROSCOPY;  Surgeon: Milus Banister, MD;  [facility];  Service: Endoscopy;  Laterality: N/A;   ESOPHAGOGASTRODUODENOSCOPY  05/05/2011   Procedure: ESOPHAGOGASTRODUODENOSCOPY (EGD);  Surgeon: Zenovia Jarred, MD;  Location: [facility];  Service: Gastroenterology;  Laterality: N/A;  Dr. Hilarie Fredrickson will do procedure for Dr. Benson Norway Saturday.   ESOPHAGOGASTRODUODENOSCOPY  05/08/2011   Procedure: ESOPHAGOGASTRODUODENOSCOPY (EGD);  Surgeon: Beryle Beams;  [facility];  Service: Endoscopy;  Laterality: N/A;   ESOPHAGOGASTRODUODENOSCOPY  06/07/2011   Procedure: ESOPHAGOGASTRODUODENOSCOPY (EGD);  Surgeon: Beryle Beams, MD;  Location: [facility];  Service: Endoscopy;  Laterality: N/A;   ESOPHAGOGASTRODUODENOSCOPY  12/20/2011   Procedure: ESOPHAGOGASTRODUODENOSCOPY (EGD);  Surgeon: Beryle Beams, MD;  Location: [facility];  Service: Endoscopy;  Laterality: N/A;   EYE SURGERY Bilateral    cataracts   FLEXIBLE SIGMOIDOSCOPY  12/21/2011   Procedure: FLEXIBLE SIGMOIDOSCOPY;  Surgeon: Beryle Beams, MD;  [facility];   Service: Endoscopy;  Laterality: N/A;   HOT HEMOSTASIS  06/07/2011   Procedure: HOT HEMOSTASIS (ARGON PLASMA COAGULATION/BICAP);  Surgeon: Beryle Beams, MD;  Location: [facility];  Service: Endoscopy;  Laterality: N/A;   HOT HEMOSTASIS N/A 07/08/2020   Procedure: HOT HEMOSTASIS (ARGON PLASMA COAGULATION/BICAP);  Surgeon: Milus Banister, MD;  Location: [facility];  Service: Endoscopy;  Laterality: N/A;   INCISIONAL HERNIA REPAIR N/A 06/01/2014   Procedure: ATTEMPTED LAPAROSCOPIC AND OPEN INCISIONAL HERNIA REPAIR WITH MESH;  Surgeon: Coralie Keens, MD;  Location: [facility];  Service: General;  Laterality: N/A;   INSERTION OF MESH N/A 06/01/2014   Procedure: INSERTION OF MESH;  Surgeon: Coralie Keens, MD;  Location: [facility];  Service: General;  Laterality: N/A;   LAPAROTOMY  02/16/2012   Procedure: EXPLORATORY LAPAROTOMY;  Surgeon: Edward Jolly, MD;  [facility];  Service: General;  Laterality: N/A;  oversewing of anastomotic leak and rigid sigmoidoscopy   LAPAROTOMY N/A 06/21/2014   Procedure: ABDOMINAL WOUND EXPLORATION;  Surgeon: Coralie Keens, MD;  Location: [facility];  Service: General;  Laterality: N/A;   LEG AMPUTATION ABOVE KNEE     left  PARTIAL COLECTOMY  02/15/2012   Procedure: PARTIAL COLECTOMY;  Surgeon: Shelly Rubenstein, MD;  [facility]: [facility];  Service: General;  Laterality: N/A;   SUBMUCOSAL INJECTION  07/08/2020   Procedure: SUBMUCOSAL INJECTION;  Surgeon: Rachael Fee, MD;  [facility];  Service: Endoscopy;;   TONSILLECTOMY     WRIST FUSION Right 04/27/2021   Procedure: SCAPHOID EXCISION  WITH 4 BONE FUSION RIGHT WRIST;  Surgeon: Betha Loa, MD;  Location: [facility];  Service: Orthopedics;  Laterality: Right;    I have reviewed the social history and family history with the patient and they are unchanged from previous note.  ALLERGIES:  is allergic to ciprofloxacin, morphine and related, penicillins, codeine, lisinopril, and feraheme  [ferumoxytol].  MEDICATIONS:  Current Outpatient Medications  Medication Sig Dispense Refill   ACCU-CHEK FASTCLIX LANCETS MISC Use to test blood glucose 3 times daily. 100 each 11   ACCU-CHEK GUIDE test strip USE TO TEST BLOOD SUGAR THREE TIMES A DAY 100 strip 3   amLODipine (NORVASC) 10 MG tablet Take 1 tablet (10 mg total) by mouth daily. 90 tablet 3   Blood Glucose Monitoring Suppl (ACCU-CHEK GUIDE) w/Device KIT 1 each by Does not apply route 3 (three) times daily. The patient is insulin requiring, ICD 10 code E11.65. The patient tests 3 times a day. 1 kit 1   calcium-vitamin D (OSCAL WITH D) 500-200 MG-UNIT tablet Take 1 tablet by mouth 2 (two) times daily. 60 tablet 0   carvedilol (COREG) 3.125 MG tablet TAKE 1 TABLET (3.125 MG TOTAL) BY MOUTH 2 (TWO) TIMES DAILY. 60 tablet 2   colchicine 0.6 MG tablet Take 0.6 mg by mouth daily as needed.     Dulaglutide 0.75 MG/0.5ML SOPN INJECT 0.75 MG INTO THE SKIN ONCE A WEEK. (Patient taking differently: Inject 0.75 mg into the skin once a week. Sunday) 6 mL 3   ergocalciferol (DRISDOL) 1.25 MG (50000 UT) capsule Take 1 capsule by mouth weekly for 4 weeks then 1 capsule monthly for 4 months 8 capsule 3   famotidine (PEPCID) 20 MG tablet TAKE 1 TABLET (20 MG TOTAL) BY MOUTH 2 (TWO) TIMES DAILY. 60 tablet 1   gabapentin (NEURONTIN) 400 MG capsule TAKE 1 CAPSULE BY MOUTH EVERY MORNING AND THEN TAKE 1 CAPSULE IN THE AFTERNOON; THEN 2 CAPSULES BY MOUTH BEFORE BEDTIME 120 capsule 5   losartan (COZAAR) 100 MG tablet TAKE 1 TABLET (100 MG TOTAL) BY MOUTH DAILY. (Patient taking differently: Take 100 mg by mouth daily.) 90 tablet 3   magnesium oxide (MAG-OX) 400 (240 Mg) MG tablet Take 1 tablet (400 mg total) by mouth daily. 30 tablet 5   metFORMIN (GLUCOPHAGE) 1000 MG tablet Take 1,000 mg by mouth 2 (two) times daily.     rosuvastatin (CRESTOR) 20 MG tablet Take 1 tablet (20 mg total) by mouth daily. 90 tablet 1   traMADol (ULTRAM) 50 MG tablet Take 1 tablet  (50 mg total) by mouth every 6 (six) hours as needed for pain 30 tablet 0   No current facility-administered medications for this visit.    PHYSICAL EXAMINATION: ECOG PERFORMANCE STATUS: 3 - Symptomatic, >50% confined to bed  Vitals:   07/20/21 0957  BP: (!) 154/81  Pulse: 81  Resp: 18  Temp: 98.5 F (36.9 C)  SpO2: 100%   Wt Readings from Last 3 Encounters:  07/20/21 154 lb 14.4 oz (70.3 kg)  05/11/21 162 lb (73.5 kg)  04/27/21 160 lb 11.5 oz (72.9 kg)  GENERAL:alert, no distress and comfortable SKIN: skin color normal, no rashes or significant lesions EYES: normal, Conjunctiva are pink and non-injected, sclera clear  NEURO: alert & oriented x 3 with fluent speech  LABORATORY DATA:  I have reviewed the data as listed CBC Latest Ref Rng & Units 07/20/2021 06/08/2021 05/26/2021  WBC 4.0 - 10.5 K/uL 6.6 6.6 5.8  Hemoglobin 12.0 - 15.0 g/dL 13.6 10.7(L) 10.5(L)  Hematocrit 36.0 - 46.0 % 40.0 32.5(L) 31.5(L)  Platelets 150 - 400 K/uL 247 269 250     CMP Latest Ref Rng & Units 05/11/2021 04/24/2021 03/30/2021  Glucose 70 - 99 mg/dL 106(H) 157(H) 126(H)  BUN 8 - 23 mg/dL 17 32(H) 13  Creatinine 0.44 - 1.00 mg/dL 1.11(H) 1.61(H) 1.09(H)  Sodium 135 - 145 mmol/L 141 138 141  Potassium 3.5 - 5.1 mmol/L 4.0 4.4 3.7  Chloride 98 - 111 mmol/L 108 105 101  CO2 22 - 32 mmol/L $RemoveB'23 25 23  'WiWvUVxk$ Calcium 8.9 - 10.3 mg/dL 8.8(L) 9.0 8.9  Total Protein 6.5 - 8.1 g/dL 6.6 - -  Total Bilirubin 0.3 - 1.2 mg/dL <0.2(L) - -  Alkaline Phos 38 - 126 U/L 55 - -  AST 15 - 41 U/L 16 - -  ALT 0 - 44 U/L 10 - -      RADIOGRAPHIC STUDIES: I have personally reviewed the radiological images as listed and agreed with the findings in the report. No results found.    No orders of the defined types were placed in this encounter.  All questions were answered. The patient knows to call the clinic with any problems, questions or concerns. No barriers to learning was detected. The total time spent in  the appointment was 25 minutes.     Truitt Merle, MD 07/20/2021   I, Wilburn Mylar, am acting as scribe for Truitt Merle, MD.   I have reviewed the above documentation for accuracy and completeness, and I agree with the above.

## 2021-07-24 ENCOUNTER — Other Ambulatory Visit: Payer: Self-pay | Admitting: Internal Medicine

## 2021-07-24 DIAGNOSIS — I10 Essential (primary) hypertension: Secondary | ICD-10-CM

## 2021-07-24 NOTE — Telephone Encounter (Signed)
Called pt b/c losartan and amlodipine were refilled 2/24 and sent to [facility]. She stated all of her BP meds go to [facility] pharmacy in [place].

## 2021-07-24 NOTE — Telephone Encounter (Signed)
Next appt scheduled 07/27/21 with PCP.

## 2021-07-27 ENCOUNTER — Ambulatory Visit (INDEPENDENT_AMBULATORY_CARE_PROVIDER_SITE_OTHER): Payer: 59 | Admitting: Internal Medicine

## 2021-07-27 ENCOUNTER — Inpatient Hospital Stay: Payer: 59 | Attending: Hematology

## 2021-07-27 ENCOUNTER — Inpatient Hospital Stay: Payer: 59

## 2021-07-27 VITALS — BP 141/86 | HR 92 | Wt 155.7 lb

## 2021-07-27 DIAGNOSIS — I10 Essential (primary) hypertension: Secondary | ICD-10-CM | POA: Diagnosis not present

## 2021-07-27 DIAGNOSIS — E119 Type 2 diabetes mellitus without complications: Secondary | ICD-10-CM

## 2021-07-27 DIAGNOSIS — N179 Acute kidney failure, unspecified: Secondary | ICD-10-CM | POA: Diagnosis not present

## 2021-07-27 DIAGNOSIS — K31819 Angiodysplasia of stomach and duodenum without bleeding: Secondary | ICD-10-CM

## 2021-07-27 DIAGNOSIS — D500 Iron deficiency anemia secondary to blood loss (chronic): Secondary | ICD-10-CM | POA: Insufficient documentation

## 2021-07-27 DIAGNOSIS — F1990 Other psychoactive substance use, unspecified, uncomplicated: Secondary | ICD-10-CM | POA: Diagnosis not present

## 2021-07-27 DIAGNOSIS — E1140 Type 2 diabetes mellitus with diabetic neuropathy, unspecified: Secondary | ICD-10-CM | POA: Diagnosis not present

## 2021-07-27 DIAGNOSIS — R808 Other proteinuria: Secondary | ICD-10-CM | POA: Diagnosis not present

## 2021-07-27 MED ORDER — ROSUVASTATIN CALCIUM 20 MG PO TABS: 20.0000 mg | ORAL_TABLET | Freq: Every day | ORAL | 1 refills | Status: DC

## 2021-07-27 MED ORDER — DULAGLUTIDE 0.75 MG/0.5ML ~~LOC~~ SOAJ: 0.7500 mg | SUBCUTANEOUS | 1 refills | Status: DC

## 2021-07-27 MED ORDER — ACCU-CHEK GUIDE VI STRP: ORAL_STRIP | 3 refills | Status: DC

## 2021-07-27 NOTE — Patient Instructions (Addendum)
It was nice seeing you today! Thank you for choosing [facility] for your Primary Care.  ?  ?Today we talked about:  ? ?Dizziness with sitting up: Make sure to work on drinking water everyday, at least 2-3 bottles. If your dizziness does not improve, please call the clinic and let us know!  ?No medication changes were made today ?Strips for your meter has been sent to the pharmacy ?Diabetic shoes have been ordered  ? ? ?Follow up in 3-4 weeks if your dizziness continues. Otherwise, follow up in 3 months ? ?Make sure to call and make an appointment with the Kidney Doctor. ?

## 2021-07-28 ENCOUNTER — Telehealth: Payer: Self-pay

## 2021-07-28 LAB — PROTEIN / CREATININE RATIO, URINE
Creatinine, Urine: 71.4 mg/dL
Protein, Ur: 587.8 mg/dL
Protein/Creat Ratio: 8232 mg/g creat — ABNORMAL HIGH (ref 0–200)

## 2021-07-28 NOTE — Assessment & Plan Note (Signed)
[name] has been following with Heme/Onc for management of chronic blood loss anemia secondary to angiodysplasias. She has recently been started on Bevacizumab with good response. Denies any further melena or hematochezia.  ?

## 2021-07-28 NOTE — Assessment & Plan Note (Signed)
[name] has a history of polysubstance abuse, particularly cocaine, alcohol and marijuana.  She has been working hard on cessation and has been able to slow down her use.  She states her last cocaine use was in February 2023.  She has decreased her weekly alcohol intake.  She does have a long-term goal to quit smoking marijuana as well. ? ?Assessment/plan: ?- Encouraged [name] to continue working on decreasing her substance use ?

## 2021-07-28 NOTE — Assessment & Plan Note (Addendum)
At Ms. Czaja's last visit, UACR was obtained that showed nephrotic range proteinuria.  Dr. Johnney Ou with [facility] planned to repeat UACR prior to consideration of kidney biopsy.  We will obtain that today.  Per chart review, patient has a history of proteinuria dating back to at least March 2018.  I am worried that her initiation of Bevacizumab in January 2023 may ultimately lead to worsening in her proteinuria.  We will need to monitor this carefully. ? ?Of note, patient has a family history of FSGS. ? ?- Urine protein creatinine ratio pending ?- Encouraged Ms. Schnepf to continue following up with Dr. Johnney Ou ?

## 2021-07-28 NOTE — Progress Notes (Signed)
? ?CC: HTN; T2DM ? ?HPI: ? ?[name] is a 71 y.o. with a PMHx as listed below who presents to the clinic for HTN; T2DM.  ? ?Please see the Encounters tab for problem-based Assessment & Plan regarding status of patient's acute and chronic conditions. ? ?Past Medical History:  ?Diagnosis Date  ? Allergic rhinitis   ? Anemia, iron deficiency 05/03/2011  ? 2/2 GI AVMs - recieves iron infusions as well as periodic red blood cell transfusions  ? Anxiety   ? Arteriovenous malformation of duodenum 02/25/2015  ? CARPAL TUNNEL RELEASE, HX OF 07/22/2008  ? Chest pain 10/28/2019  ? Colon polyps   ? Diabetes mellitus without complication ([place])   ? type 2  ? Difficulty sleeping   ? takes trazadone for sleep  ? Diverticulosis   ? Fall 01/03/2020  ? GERD (gastroesophageal reflux disease)   ? Hepatitis C   ? in SVR with Harvoni 12/2014-02/2015.   ? History of hernia repair 08/18/2012  ? History of porphyria 11/30/2014  ? Porphyria cutanea tarda in setting of chronic Hepatitis C, frequent  IV iron infusions, and alcohol abuse   ? Hypertension   ? Hypocalcemia 10/28/2019  ? Hypokalemia 09/27/2014  ? Peripheral vascular disease ([place])   ? Stroke [facility]) 2010  ? no residual deficits  ? Substance use disorder   ? cocaine  ? Tachycardia 07/10/2017  ? Trichomonas vaginitis 04/16/2019  ? ?Review of Systems: Review of Systems  ?Constitutional:  Negative for chills, fever, malaise/fatigue and weight loss.  ?Respiratory:  Negative for cough and shortness of breath.   ?Cardiovascular:  Negative for chest pain and leg swelling.  ?Gastrointestinal:  Negative for abdominal pain, nausea and vomiting.  ?Neurological:  Negative for dizziness and headaches.  ?Psychiatric/Behavioral:  Positive for substance abuse. Negative for depression. The patient is not nervous/anxious.   ? ?Physical Exam: ? ?Vitals:  ? 07/27/21 1453 07/27/21 1531  ?BP: (!) 149/80 (!) 141/86  ?Pulse: 91 92  ?SpO2: 97%   ?Weight: 155 lb 11.2 oz (70.6 kg)   ? ?Physical Exam ?Vitals and  nursing note reviewed.  ?Constitutional:   ?   General: She is not in acute distress. ?   Appearance: She is normal weight. She is not toxic-appearing.  ?HENT:  ?   Head: Normocephalic and atraumatic.  ?Eyes:  ?   Conjunctiva/sclera: Conjunctivae normal.  ?   Pupils: Pupils are equal, round, and reactive to light.  ?Cardiovascular:  ?   Rate and Rhythm: Normal rate and regular rhythm.  ?   Pulses:     ?     Dorsalis pedis pulses are 2+ on the right side.  ?   Heart sounds: No murmur heard. ?  No gallop.  ?Pulmonary:  ?   Effort: Pulmonary effort is normal. No respiratory distress.  ?   Breath sounds: Normal breath sounds. No wheezing, rhonchi or rales.  ?Abdominal:  ?   General: Bowel sounds are normal.  ?   Palpations: Abdomen is soft.  ?Musculoskeletal:  ?   Right lower leg: No edema.  ?   Left Lower Extremity: Left leg is amputated above knee.  ?Skin: ?   General: Skin is warm and dry.  ?Neurological:  ?   Mental Status: She is alert and oriented to person, place, and time. Mental status is at baseline.  ?Psychiatric:     ?   Mood and Affect: Mood normal.     ?   Behavior: Behavior normal.     ?  Thought Content: Thought content normal.     ?   Judgment: Judgment normal.  ? ? ?Assessment & Plan:  ? ?See Encounters Tab for problem based charting. ? ?Patient discussed with Dr. Evette Doffing ? ?

## 2021-07-28 NOTE — Assessment & Plan Note (Signed)
BP: (!) 141/86 ? ?[name] states that she is taking amlodipine, carvedilol, and losartan daily.  Over the last few weeks, she has noticed dizziness when she goes from a laying to a sitting position.  She was unsure if this was secondary to any of her medications.  She denies any other symptoms at this time. ? ?Assessment/plan: ?Blood pressure remains above goal today.  At her last appointment, low-dose carvedilol was started.  I suspect that her orthostasis may be dehydration related, but cannot rule out that it is a medication adverse effect.  Due to this, we will not titrate her medicines at all today.  In the long run, her blood pressure goal will need to be adjusted if she continues to experience orthostasis. ? ?-Continue amlodipine 10 mg daily, carvedilol 3.125 mg twice daily, and losartan 100 mg daily ?

## 2021-07-28 NOTE — Assessment & Plan Note (Addendum)
Ms. Gipe has a past medical history of frequent AKI's in the setting of polysubstance abuse.  There is concern that she may be developing CKD secondary to hypertensive nephrosclerosis, per nephrology's notes.   ? ?- BMP pending ?

## 2021-07-28 NOTE — Assessment & Plan Note (Signed)
>>  ASSESSMENT AND PLAN FOR TYPE 2 DIABETES MELLIT[facility] WITH DIABETIC NEUROPATHY ([facility]) WRITTEN ON 07/28/2021 12:00 PM BY ARNETT SAUNDERS, MD  Ms. Amico denies any difficulty with her current medication regimen, which includes Trulicity  and metformin .  She has not been checking her CBGs as she ran out of strips.  Assessment/plan: A1c has always been within goal, however her chronic blood loss anemia is likely affecting this.  Diabetic foot exam reassuring today.  - Diabetic shoes ordered today - Continue Trulicity  and metformin  - Accu-Chek strips reordered

## 2021-07-28 NOTE — Telephone Encounter (Signed)
I called patient to come back in for Labs but the person who answered said patient would need to call us back on Monday because she was unavialable.I left number with the person for the patient [name] C3/3/20239:42 AM ? ?

## 2021-07-28 NOTE — Assessment & Plan Note (Signed)
[name] denies any difficulty with her current medication regimen, which includes Trulicity and metformin.  She has not been checking her CBGs as she ran out of strips. ? ?Assessment/plan: ?A1c has always been within goal, however her chronic blood loss anemia is likely affecting this.  Diabetic foot exam reassuring today. ? ?- Diabetic shoes ordered today ?- Continue Trulicity and metformin ?- Accu-Chek strips reordered ?

## 2021-07-28 NOTE — Assessment & Plan Note (Signed)
Previous history of hypomagnesemia in the setting of polysubstance use.  She continues to take Mag-Ox intermittently. ? ?- Repeat magnesium level today ?

## 2021-07-31 NOTE — Progress Notes (Signed)
Internal Medicine Clinic Attending  Case discussed with Dr. Basaraba  At the time of the visit.  We reviewed the resident's history and exam and pertinent patient test results.  I agree with the assessment, diagnosis, and plan of care documented in the resident's note.  

## 2021-08-02 ENCOUNTER — Other Ambulatory Visit (HOSPITAL_COMMUNITY): Payer: Self-pay

## 2021-08-03 ENCOUNTER — Telehealth: Payer: Self-pay | Admitting: Internal Medicine

## 2021-08-03 ENCOUNTER — Other Ambulatory Visit: Payer: Self-pay

## 2021-08-03 ENCOUNTER — Inpatient Hospital Stay: Payer: 59

## 2021-08-03 DIAGNOSIS — D500 Iron deficiency anemia secondary to blood loss (chronic): Secondary | ICD-10-CM

## 2021-08-03 LAB — CBC WITH DIFFERENTIAL (CANCER CENTER ONLY)
Abs Immature Granulocytes: 0.07 10*3/uL (ref 0.00–0.07)
Basophils Absolute: 0 10*3/uL (ref 0.0–0.1)
Basophils Relative: 0 %
Eosinophils Absolute: 0.1 10*3/uL (ref 0.0–0.5)
Eosinophils Relative: 2 %
HCT: 35.9 % — ABNORMAL LOW (ref 36.0–46.0)
Hemoglobin: 12.5 g/dL (ref 12.0–15.0)
Immature Granulocytes: 1 %
Lymphocytes Relative: 10 %
Lymphs Abs: 0.8 10*3/uL (ref 0.7–4.0)
MCH: 31.9 pg (ref 26.0–34.0)
MCHC: 34.8 g/dL (ref 30.0–36.0)
MCV: 91.6 fL (ref 80.0–100.0)
Monocytes Absolute: 0.8 10*3/uL (ref 0.1–1.0)
Monocytes Relative: 10 %
Neutro Abs: 6.3 10*3/uL (ref 1.7–7.7)
Neutrophils Relative %: 77 %
Platelet Count: 230 10*3/uL (ref 150–400)
RBC: 3.92 MIL/uL (ref 3.87–5.11)
RDW: 13.6 % (ref 11.5–15.5)
WBC Count: 8.1 10*3/uL (ref 4.0–10.5)
nRBC: 0 % (ref 0.0–0.2)

## 2021-08-03 LAB — IRON AND IRON BINDING CAPACITY (CC-WL,HP ONLY)
Iron: 49 ug/dL (ref 28–170)
Saturation Ratios: 19 % (ref 10.4–31.8)
TIBC: 252 ug/dL (ref 250–450)
UIBC: 203 ug/dL (ref 148–442)

## 2021-08-03 LAB — FERRITIN: Ferritin: 270 ng/mL (ref 11–307)

## 2021-08-03 NOTE — Telephone Encounter (Signed)
I called [name] today to discuss her urine test results. I urged her to call [facility] for a follow up appointment with Dr. Johnney Ou. She states she will call this afternoon.  ?

## 2021-08-09 ENCOUNTER — Other Ambulatory Visit: Payer: 59

## 2021-08-10 ENCOUNTER — Inpatient Hospital Stay: Payer: 59

## 2021-08-10 DIAGNOSIS — D500 Iron deficiency anemia secondary to blood loss (chronic): Secondary | ICD-10-CM

## 2021-08-10 LAB — CBC WITH DIFFERENTIAL (CANCER CENTER ONLY)
Abs Immature Granulocytes: 0.1 10*3/uL — ABNORMAL HIGH (ref 0.00–0.07)
Basophils Absolute: 0.1 10*3/uL (ref 0.0–0.1)
Basophils Relative: 1 %
Eosinophils Absolute: 0.2 10*3/uL (ref 0.0–0.5)
Eosinophils Relative: 3 %
HCT: 42.2 % (ref 36.0–46.0)
Hemoglobin: 14.1 g/dL (ref 12.0–15.0)
Immature Granulocytes: 1 %
Lymphocytes Relative: 18 %
Lymphs Abs: 1.3 10*3/uL (ref 0.7–4.0)
MCH: 31 pg (ref 26.0–34.0)
MCHC: 33.4 g/dL (ref 30.0–36.0)
MCV: 92.7 fL (ref 80.0–100.0)
Monocytes Absolute: 0.8 10*3/uL (ref 0.1–1.0)
Monocytes Relative: 12 %
Neutro Abs: 4.7 10*3/uL (ref 1.7–7.7)
Neutrophils Relative %: 65 %
Platelet Count: 300 10*3/uL (ref 150–400)
RBC: 4.55 MIL/uL (ref 3.87–5.11)
RDW: 13.3 % (ref 11.5–15.5)
WBC Count: 7.2 10*3/uL (ref 4.0–10.5)
nRBC: 0 % (ref 0.0–0.2)

## 2021-08-10 LAB — IRON AND IRON BINDING CAPACITY (CC-WL,HP ONLY)
Iron: 120 ug/dL (ref 28–170)
Saturation Ratios: 40 % — ABNORMAL HIGH (ref 10.4–31.8)
TIBC: 302 ug/dL (ref 250–450)
UIBC: 182 ug/dL (ref 148–442)

## 2021-08-10 LAB — FERRITIN: Ferritin: 401 ng/mL — ABNORMAL HIGH (ref 11–307)

## 2021-08-16 ENCOUNTER — Telehealth: Payer: Self-pay

## 2021-08-16 NOTE — Telephone Encounter (Signed)
I called patient back about getting a new electric wheelchair. The patient got her chair on 08-27-2016 and has had several repairs on it. I called the company [phone] and if she has not had a visit about the wheelchair then we could a Tele-health visit, I spoke to Verdie Drown who said to let her no when the appointment will be and they will fax paperwork for the doctor to fill out. The Tele-health visit will be March 29,2023'@1'$ :15.I have spoken to 05-23-1995.I will send the PCP a note about this . ?

## 2021-08-18 ENCOUNTER — Telehealth: Payer: Self-pay

## 2021-08-18 ENCOUNTER — Inpatient Hospital Stay (HOSPITAL_BASED_OUTPATIENT_CLINIC_OR_DEPARTMENT_OTHER): Payer: 59 | Admitting: Hematology

## 2021-08-18 ENCOUNTER — Inpatient Hospital Stay: Payer: 59

## 2021-08-18 ENCOUNTER — Other Ambulatory Visit: Payer: Self-pay

## 2021-08-18 ENCOUNTER — Encounter: Payer: Self-pay | Admitting: Hematology

## 2021-08-18 DIAGNOSIS — D500 Iron deficiency anemia secondary to blood loss (chronic): Secondary | ICD-10-CM

## 2021-08-18 DIAGNOSIS — Z8619 Personal history of other infectious and parasitic diseases: Secondary | ICD-10-CM

## 2021-08-18 NOTE — Progress Notes (Signed)
?[place]   ?Telephone:([phone]) [phone] [phone]   ?Clinic Follow up Note  ? ?Patient Care Team: ?Jose Persia, MD as PCP - General ?Clent Jacks, MD as Consulting Physician (Ophthalmology) ?Coralie Keens, MD as Consulting Physician (General Surgery) ?Truitt Merle, MD as Consulting Physician (Hematology) ?Thayer Headings, MD as Consulting Physician (Infectious Diseases) ?Doran Stabler, MD as Consulting Physician (Gastroenterology) ? ?Date of Service:  08/18/2021 ? ?I connected with [name] on 08/18/2021 at 10:00 AM EDT by telephone visit and verified that I am speaking with the correct person using two identifiers.  ?I discussed the limitations, risks, security and privacy concerns of performing an evaluation and management service by telephone and the availability of in person appointments. I also discussed with the patient that there may be a patient responsible charge related to this service. The patient expressed understanding and agreed to proceed.  ? ?Other persons participating in the visit and their role in the encounter:  none ? ?Patient's location:  home ?Provider's location:  my office ? ?CHIEF COMPLAINT: f/u of anemia ? ?CURRENT THERAPY:  ?-IV Feraheme as needed, last 05/26/21 ?-Blood transfusions as needed, last 08/05/20 ? ?ASSESSMENT & PLAN:  ?[name] is a 71 y.o. female with  ? ?1. Iron deficiency anemia secondary to GI blood loss, and Anemia of Chronic Disease ?-She has a longstanding history of intermittent anemia dating back at least 2008 Hg was 10. ?- History of multiple GI bleeding in 2016, required blood transfusion, hospitalization and IV Feraheme ?-She had an EGD and colonoscopy on 10/29/2017 and 03/17/2018 with Dr. Loletha Carrow, which were normal. 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 ?-Due to recurrent anemia requiring IV Feraheme and blood transfusions, she started bevacizumab for GI bleeding on 08/10/20. She responded very  well with resolved anemia and less need for iv iron. This has been held intermittently due to elevated urine protein. I plan to repeat only when she develops anemia again  ?-labs from 08/10/21 reviewed, her CBC and iron were WNL, ferritin slightly elevated. ?-We will continue to monitor. ?  ?2. Porphyria cutaneous tarda (PCT), resolved after Hep C Treatment  ?  ?3. Comorbidities: DM2, insomnia, GERD, chronic Hep C infection, cocaine use ?-Management per PCP's office.  ?-GERD uncontrolled on Tums and omeprazole 20 mg once daily ?-positive for hep C in 2009, received treatment in 2016. 05/2018 liver US unremarkable ?-h/o cocaine use. She denied recent use at visit on 07/20/21 ?  ?5. Left AKA, has prosthesis; Right LE edema ?-continues to have mild right LE edema. ?  ?  ?PLAN: ?-lab every 4 weeks x3, will set up iv iron if ferritin<50 ?-will consider beva if she develops anemia again  ?-f/u in 12 weeks ?-I wrote a letter to [place] care for her electric wheelchair  ? ? ?No problem-specific Assessment & Plan notes found for this encounter. ? ? ?INTERVAL HISTORY:  ?[name] was contacted for a follow up of anemia. She was last seen by me on 07/20/21.  ?She reports she is feeling well overall. ?  ?All other systems were reviewed with the patient and are negative. ? ?MEDICAL HISTORY:  ?Past Medical History:  ?Diagnosis Date  ? Allergic rhinitis   ? Anemia, iron deficiency 05/03/2011  ? 2/2 GI AVMs - recieves iron infusions as well as periodic red blood cell transfusions  ? Anxiety   ? Arteriovenous malformation of duodenum 02/25/2015  ? CARPAL TUNNEL  RELEASE, HX OF 07/22/2008  ? Chest pain 10/28/2019  ? Colon polyps   ? Diabetes mellitus without complication ([place])   ? type 2  ? Difficulty sleeping   ? takes trazadone for sleep  ? Diverticulosis   ? Fall 01/03/2020  ? GERD (gastroesophageal reflux disease)   ? Hepatitis C   ? in SVR with Harvoni 12/2014-02/2015.   ? History of hernia repair 08/18/2012  ? History of  porphyria 11/30/2014  ? Porphyria cutanea tarda in setting of chronic Hepatitis C, frequent  IV iron infusions, and alcohol abuse   ? Hypertension   ? Hypocalcemia 10/28/2019  ? Hypokalemia 09/27/2014  ? Peripheral vascular disease ([place])   ? Stroke [facility]) 2010  ? no residual deficits  ? Substance use disorder   ? cocaine  ? Tachycardia 07/10/2017  ? Trichomonas vaginitis 04/16/2019  ? ? ?SURGICAL HISTORY: ?Past Surgical History:  ?Procedure Laterality Date  ? ABDOMINAL HYSTERECTOMY    ? APPLICATION OF WOUND VAC N/A 06/21/2014  ? Procedure: APPLICATION OF WOUND VAC;  Surgeon: Coralie Keens, MD;  Location: [facility];  Service: General;  Laterality: N/A;  ? CARPAL TUNNEL RELEASE    ? rt hand  ? COLONOSCOPY WITH PROPOFOL N/A 03/17/2018  ? Procedure: COLONOSCOPY WITH PROPOFOL;  Surgeon: Doran Stabler, MD;  [facility];  Service: Gastroenterology;  Laterality: N/A;  ? ENTEROSCOPY N/A 12/04/2012  ? Procedure: ENTEROSCOPY;  Surgeon: Beryle Beams, MD;  [facility];  Service: Endoscopy;  Laterality: N/A;  ? ENTEROSCOPY N/A 12/18/2012  ? Procedure: ENTEROSCOPY;  Surgeon: Beryle Beams, MD;  [facility];  Service: Endoscopy;  Laterality: N/A;  ? ENTEROSCOPY N/A 02/25/2015  ? Procedure: ENTEROSCOPY;  Surgeon: Inda Castle, MD;  Location: [facility];  Service: Endoscopy;  Laterality: N/A;  ? ENTEROSCOPY N/A 10/29/2017  ? Procedure: ENTEROSCOPY;  Surgeon: Doran Stabler, MD;  [facility];  Service: Gastroenterology;  Laterality: N/A;  ? ENTEROSCOPY N/A 07/08/2020  ? Procedure: ENTEROSCOPY;  Surgeon: Milus Banister, MD;  Location: [facility];  Service: Endoscopy;  Laterality: N/A;  ? ESOPHAGOGASTRODUODENOSCOPY  05/05/2011  ? Procedure: ESOPHAGOGASTRODUODENOSCOPY (EGD);  Surgeon: Zenovia Jarred, MD;  Location: [facility];  Service: Gastroenterology;  Laterality: N/A;  Dr. Hilarie Fredrickson will do procedure for Dr. Benson Norway Saturday.  ? ESOPHAGOGASTRODUODENOSCOPY  05/08/2011  ? Procedure:  ESOPHAGOGASTRODUODENOSCOPY (EGD);  Surgeon: Beryle Beams;  [facility];  Service: Endoscopy;  Laterality: N/A;  ? ESOPHAGOGASTRODUODENOSCOPY  06/07/2011  ? Procedure: ESOPHAGOGASTRODUODENOSCOPY (EGD);  Surgeon: Beryle Beams, MD;  Location[facility];  Service: Endoscopy;  Laterality: N/A;  ? ESOPHAGOGASTRODUODENOSCOPY  12/20/2011  ? Procedure: ESOPHAGOGASTRODUODENOSCOPY (EGD);  Surgeon: Beryle Beams, MD;  Location[facility];  Service: Endoscopy;  Laterality: N/A;  ? EYE SURGERY Bilateral   ? cataracts  ? FLEXIBLE SIGMOIDOSCOPY  12/21/2011  ? Procedure: FLEXIBLE SIGMOIDOSCOPY;  Surgeon: Beryle Beams, MD;  [facility];  Service: Endoscopy;  Laterality: N/A;  ? HOT HEMOSTASIS  06/07/2011  ? Procedure: HOT HEMOSTASIS (ARGON PLASMA COAGULATION/BICAP);  Surgeon: Beryle Beams, MD;  [facility];  Service: Endoscopy;  Laterality: N/A;  ? HOT HEMOSTASIS N/A 07/08/2020  ? Procedure: HOT HEMOSTASIS (ARGON PLASMA COAGULATION/BICAP);  Surgeon: Milus Banister, MD;  Location[facility];  Service: Endoscopy;  Laterality: N/A;  ? INCISIONAL HERNIA REPAIR N/A 06/01/2014  ? Procedure: ATTEMPTED LAPAROSCOPIC AND OPEN INCISIONAL HERNIA REPAIR WITH MESH;  Surgeon: Coralie Keens, MD;  Location: [facility];  Service:  General;  Laterality: N/A;  ? INSERTION OF MESH N/A 06/01/2014  ? Procedure: INSERTION OF MESH;  Surgeon: Coralie Keens, MD;  Location: [facility];  Service: General;  Laterality: N/A;  ? LAPAROTOMY  02/16/2012  ? Procedure: EXPLORATORY LAPAROTOMY;  Surgeon: Edward Jolly, MD;  [facility];  Service: General;  Laterality: N/A;  oversewing of anastomotic leak and rigid sigmoidoscopy  ? LAPAROTOMY N/A 06/21/2014  ? Procedure: ABDOMINAL WOUND EXPLORATION;  Surgeon: Coralie Keens, MD;  Location: [facility];  Service: General;  Laterality: N/A;  ? LEG AMPUTATION ABOVE KNEE    ? left  ? PARTIAL COLECTOMY  02/15/2012  ? Procedure: PARTIAL COLECTOMY;  Surgeon: Harl Bowie, MD;   [facility];  Service: General;  Laterality: N/A;  ? SUBMUCOSAL INJECTION  07/08/2020  ? Procedure: SUBMUCOSAL INJECTION;  Surgeon: Milus Banister, MD;  Location[facility];  Service: Endoscopy;;  ? TONSILLECTOMY

## 2021-08-18 NOTE — Telephone Encounter (Signed)
Letter for [employer] faxed to [facility] [place] [phone].  Confirmation received ?

## 2021-08-21 ENCOUNTER — Telehealth: Payer: Self-pay | Admitting: Hematology

## 2021-08-21 NOTE — Telephone Encounter (Signed)
Scheduled per 3/24 los, calenders mailed per pt request ?

## 2021-08-23 ENCOUNTER — Encounter: Payer: Self-pay | Admitting: Hematology

## 2021-08-23 ENCOUNTER — Ambulatory Visit (INDEPENDENT_AMBULATORY_CARE_PROVIDER_SITE_OTHER): Payer: 59 | Admitting: Internal Medicine

## 2021-08-23 ENCOUNTER — Other Ambulatory Visit (HOSPITAL_COMMUNITY): Payer: Self-pay

## 2021-08-23 DIAGNOSIS — Z7689 Persons encountering health services in other specified circumstances: Secondary | ICD-10-CM | POA: Diagnosis not present

## 2021-08-23 DIAGNOSIS — E1140 Type 2 diabetes mellitus with diabetic neuropathy, unspecified: Secondary | ICD-10-CM

## 2021-08-23 MED ORDER — ACCU-CHEK GUIDE VI STRP
ORAL_STRIP | 3 refills | Status: AC
  Filled 2021-08-23: qty 100, 33d supply, fill #0

## 2021-08-23 NOTE — Progress Notes (Addendum)
?  [facility] Telephone Encounter ?Continuity Care Appointment ? ?HPI:  ?This telephone encounter was created for [name] on 08/23/2021 for the following purpose/cc encounter for mobility assessment for powerchair.   ? ?Past Medical History:  ?Past Medical History:  ?Diagnosis Date  ? Allergic rhinitis   ? Anemia, iron deficiency 05/03/2011  ? 2/2 GI AVMs - recieves iron infusions as well as periodic red blood cell transfusions  ? Anxiety   ? Arteriovenous malformation of duodenum 02/25/2015  ? CARPAL TUNNEL RELEASE, HX OF 07/22/2008  ? Chest pain 10/28/2019  ? Colon polyps   ? Diabetes mellitus without complication ([place])   ? type 2  ? Difficulty sleeping   ? takes trazadone for sleep  ? Diverticulosis   ? Fall 01/03/2020  ? GERD (gastroesophageal reflux disease)   ? Hepatitis C   ? in SVR with Harvoni 12/2014-02/2015.   ? History of hernia repair 08/18/2012  ? History of porphyria 11/30/2014  ? Porphyria cutanea tarda in setting of chronic Hepatitis C, frequent  IV iron infusions, and alcohol abuse   ? Hypertension   ? Hypocalcemia 10/28/2019  ? Hypokalemia 09/27/2014  ? Peripheral vascular disease ([place])   ? Stroke [facility]) 2010  ? no residual deficits  ? Substance use disorder   ? cocaine  ? Tachycardia 07/10/2017  ? Trichomonas vaginitis 04/16/2019  ?  ? ?ROS:  ?+ left knee AKA  ? ?Assessment / Plan / Recommendations:  ?Please see A&P under problem oriented charting for assessment of the patient's acute and chronic medical conditions.  ?As always, pt is advised that if symptoms worsen or new symptoms arise, they should go to an urgent care facility or to to ER for further evaluation.  ? ?Consent and Medical Decision Making:  ?Patient discussed with Dr. Evette Doffing ?This is a telephone encounter between [name] and Jose Persia on 08/23/2021 for encounter for mobility assessment for powerchair. The visit was conducted with the patient located at home and Jose Persia at [facility]. The  patient's identity was confirmed using their DOB and current address. The patient has consented to being evaluated through a telephone encounter and understands the associated risks (an examination cannot be done and the patient may need to come in for an appointment) / benefits (allows the patient to remain at home, decreasing exposure to coronavirus). I personally spent 10 minutes on medical discussion.   ? ?

## 2021-08-24 NOTE — Assessment & Plan Note (Signed)
-   Refilled glucometer strips  ?

## 2021-08-24 NOTE — Assessment & Plan Note (Addendum)
[name] is here for a mobility examination & assessment for powerchair assessment. The patient's medical conditions which have contributed to the powerchair need include left above knee amputation. The patient requires the powerchair to complete ADL's including grooming, bathing, dressing and toileting. The patient does not desaturate with exertion.  The patient is unable to use a cane or walker due to limited wrist mobility after surgery and unable to use a manual chair due to limited wrist mobility after surgery. The patient is unable to use a scooter due to limited wrist mobility after surgery. [name] has the physical and mental ability to operate a power wheelchair safely in his home and is motivated to use the power wheelchair. PT evaluation for wheel chair assessment has been placed.  ? ?Ht Readings from Last 3 Encounters:  ?07/20/21 '5\' 1"'$  (1.549 m)  ?05/11/21 '5\' 1"'$  (1.549 m)  ?04/27/21 '5\' 1"'$  (1.549 m)  ? ?Weight: 155 lbs ?

## 2021-08-24 NOTE — Assessment & Plan Note (Signed)
>>  ASSESSMENT AND PLAN FOR TYPE 2 DIABETES MELLITUS WITH DIABETIC NEUROPATHY ([facility]CC) WRITTEN ON 08/24/2021  2:28 PM BY ARNETT SAUNDERS, MD  - Refilled glucometer strips

## 2021-08-25 NOTE — Progress Notes (Signed)
Internal Medicine Clinic Attending  Case discussed with Dr. Basaraba  At the time of the visit.  We reviewed the resident's history and pertinent patient test results.  I agree with the assessment, diagnosis, and plan of care documented in the resident's note.  

## 2021-08-31 ENCOUNTER — Other Ambulatory Visit (HOSPITAL_COMMUNITY): Payer: Self-pay

## 2021-09-07 ENCOUNTER — Other Ambulatory Visit (HOSPITAL_COMMUNITY): Payer: Self-pay

## 2021-09-07 ENCOUNTER — Other Ambulatory Visit: Payer: Self-pay | Admitting: Internal Medicine

## 2021-09-07 DIAGNOSIS — E119 Type 2 diabetes mellitus without complications: Secondary | ICD-10-CM

## 2021-09-07 MED ORDER — TRULICITY 0.75 MG/0.5ML ~~LOC~~ SOAJ
0.7500 mg | SUBCUTANEOUS | 3 refills | Status: DC
  Filled 2021-09-07: qty 6, 84d supply, fill #0
  Filled 2021-12-15: qty 6, 84d supply, fill #1
  Filled 2022-03-07 (×2): qty 6, 84d supply, fill #2

## 2021-09-14 ENCOUNTER — Other Ambulatory Visit (HOSPITAL_COMMUNITY): Payer: Self-pay

## 2021-09-15 ENCOUNTER — Other Ambulatory Visit: Payer: Self-pay

## 2021-09-15 ENCOUNTER — Inpatient Hospital Stay: Payer: 59

## 2021-09-15 ENCOUNTER — Inpatient Hospital Stay: Payer: 59 | Attending: Hematology

## 2021-09-15 DIAGNOSIS — B182 Chronic viral hepatitis C: Secondary | ICD-10-CM | POA: Insufficient documentation

## 2021-09-15 DIAGNOSIS — Z8619 Personal history of other infectious and parasitic diseases: Secondary | ICD-10-CM

## 2021-09-15 DIAGNOSIS — D631 Anemia in chronic kidney disease: Secondary | ICD-10-CM | POA: Insufficient documentation

## 2021-09-15 DIAGNOSIS — D500 Iron deficiency anemia secondary to blood loss (chronic): Secondary | ICD-10-CM | POA: Insufficient documentation

## 2021-09-15 LAB — CBC WITH DIFFERENTIAL (CANCER CENTER ONLY)
Abs Immature Granulocytes: 0.04 10*3/uL (ref 0.00–0.07)
Basophils Absolute: 0 10*3/uL (ref 0.0–0.1)
Basophils Relative: 1 %
Eosinophils Absolute: 0.2 10*3/uL (ref 0.0–0.5)
Eosinophils Relative: 2 %
HCT: 34 % — ABNORMAL LOW (ref 36.0–46.0)
Hemoglobin: 11.6 g/dL — ABNORMAL LOW (ref 12.0–15.0)
Immature Granulocytes: 1 %
Lymphocytes Relative: 17 %
Lymphs Abs: 1.1 10*3/uL (ref 0.7–4.0)
MCH: 32 pg (ref 26.0–34.0)
MCHC: 34.1 g/dL (ref 30.0–36.0)
MCV: 93.7 fL (ref 80.0–100.0)
Monocytes Absolute: 0.9 10*3/uL (ref 0.1–1.0)
Monocytes Relative: 13 %
Neutro Abs: 4.5 10*3/uL (ref 1.7–7.7)
Neutrophils Relative %: 66 %
Platelet Count: 264 10*3/uL (ref 150–400)
RBC: 3.63 MIL/uL — ABNORMAL LOW (ref 3.87–5.11)
RDW: 13.2 % (ref 11.5–15.5)
WBC Count: 6.7 10*3/uL (ref 4.0–10.5)
nRBC: 0 % (ref 0.0–0.2)

## 2021-09-15 LAB — IRON AND IRON BINDING CAPACITY (CC-WL,HP ONLY)
Iron: 128 ug/dL (ref 28–170)
Saturation Ratios: 46 % — ABNORMAL HIGH (ref 10.4–31.8)
TIBC: 281 ug/dL (ref 250–450)
UIBC: 153 ug/dL (ref 148–442)

## 2021-09-15 LAB — FERRITIN: Ferritin: 228 ng/mL (ref 11–307)

## 2021-09-16 LAB — AFP TUMOR MARKER: AFP, Serum, Tumor Marker: 2.2 ng/mL (ref 0.0–9.2)

## 2021-09-22 ENCOUNTER — Telehealth: Payer: Self-pay

## 2021-09-22 NOTE — Telephone Encounter (Signed)
I spoke with patient about her [place] which has been repaired several times. Patient has already had a Tele-health visit for getting a new one.I need a DME order for a Industrial/product designer and I will send a community message to [facility] where she received her other chair [place], [place] C4/28/20239:06 AM ? ?

## 2021-09-25 ENCOUNTER — Telehealth: Payer: Self-pay

## 2021-09-25 NOTE — Telephone Encounter (Signed)
Spoke with pt via telephone regarding recent lab results.  Informed pt that her iron levels were good but she has some mild anemia.  Informed pt that Dr. Burr Medico will continue to monitor her labs for now.  Pt verbalized understanding and had no further questions or concerns at this time. ?

## 2021-09-26 NOTE — Addendum Note (Signed)
Addended by: Jose Persia on: 09/26/2021 02:06 PM ? ? Modules accepted: Orders ? ?

## 2021-09-27 ENCOUNTER — Telehealth: Payer: Self-pay | Admitting: Internal Medicine

## 2021-09-27 NOTE — Telephone Encounter (Signed)
Rec'd a phone call from Andria Rhein with [facility].  The OV note has been reviewed for 08/23/2021 and a request for a PT evaluated was needed but, the OV notes states no PT is Required. ? ?Please advise if a  Referral can be placed to Neuro Rehab instead to Assess the need and for what type of Chair she would qualify for ins purposes as well. ? ? ?

## 2021-09-29 NOTE — Addendum Note (Signed)
Addended by: Jose Persia on: 09/29/2021 06:08 PM ? ? Modules accepted: Orders ? ?

## 2021-10-13 ENCOUNTER — Inpatient Hospital Stay: Payer: 59

## 2021-10-13 ENCOUNTER — Inpatient Hospital Stay: Payer: 59 | Attending: Hematology

## 2021-10-13 DIAGNOSIS — D500 Iron deficiency anemia secondary to blood loss (chronic): Secondary | ICD-10-CM | POA: Insufficient documentation

## 2021-10-13 DIAGNOSIS — D631 Anemia in chronic kidney disease: Secondary | ICD-10-CM | POA: Diagnosis not present

## 2021-10-13 LAB — CBC WITH DIFFERENTIAL (CANCER CENTER ONLY)
Abs Immature Granulocytes: 0.04 10*3/uL (ref 0.00–0.07)
Basophils Absolute: 0 10*3/uL (ref 0.0–0.1)
Basophils Relative: 0 %
Eosinophils Absolute: 0.2 10*3/uL (ref 0.0–0.5)
Eosinophils Relative: 2 %
HCT: 31.1 % — ABNORMAL LOW (ref 36.0–46.0)
Hemoglobin: 10.5 g/dL — ABNORMAL LOW (ref 12.0–15.0)
Immature Granulocytes: 1 %
Lymphocytes Relative: 14 %
Lymphs Abs: 1 10*3/uL (ref 0.7–4.0)
MCH: 32.6 pg (ref 26.0–34.0)
MCHC: 33.8 g/dL (ref 30.0–36.0)
MCV: 96.6 fL (ref 80.0–100.0)
Monocytes Absolute: 1 10*3/uL (ref 0.1–1.0)
Monocytes Relative: 13 %
Neutro Abs: 5.2 10*3/uL (ref 1.7–7.7)
Neutrophils Relative %: 70 %
Platelet Count: 264 10*3/uL (ref 150–400)
RBC: 3.22 MIL/uL — ABNORMAL LOW (ref 3.87–5.11)
RDW: 13.1 % (ref 11.5–15.5)
WBC Count: 7.4 10*3/uL (ref 4.0–10.5)
nRBC: 0 % (ref 0.0–0.2)

## 2021-10-13 LAB — IRON AND IRON BINDING CAPACITY (CC-WL,HP ONLY)
Iron: 46 ug/dL (ref 28–170)
Saturation Ratios: 15 % (ref 10.4–31.8)
TIBC: 311 ug/dL (ref 250–450)
UIBC: 265 ug/dL (ref 148–442)

## 2021-10-13 LAB — FERRITIN: Ferritin: 160 ng/mL (ref 11–307)

## 2021-10-19 ENCOUNTER — Encounter: Payer: 59 | Admitting: Internal Medicine

## 2021-10-24 ENCOUNTER — Encounter: Payer: 59 | Admitting: Internal Medicine

## 2021-10-25 ENCOUNTER — Telehealth: Payer: Self-pay

## 2021-10-25 NOTE — Telephone Encounter (Signed)
RTC to patient.  Would like to get all of her medications sent to the [facility], .  Did not like the pill packs from the mail order pharmacy.  Only got packets with only 3 pills in them.  Plans to call the Mail order Phar,acy and see if they will transfer her medications to the [facility].  Call to [facility].  [facility] and thell the, the medications needed and they will call the other pharmacy for her.  Patient unavailable now will call later to inform her.

## 2021-10-25 NOTE — Telephone Encounter (Signed)
Returned call to patient. No answer, and no VM set up.

## 2021-10-25 NOTE — Telephone Encounter (Signed)
Requesting to speak with a nurse, please call back.

## 2021-10-27 ENCOUNTER — Inpatient Hospital Stay: Payer: 59 | Attending: Hematology

## 2021-10-27 ENCOUNTER — Other Ambulatory Visit: Payer: Self-pay

## 2021-10-27 ENCOUNTER — Inpatient Hospital Stay: Payer: 59

## 2021-10-27 ENCOUNTER — Telehealth: Payer: Self-pay | Admitting: Internal Medicine

## 2021-10-27 VITALS — BP 120/75 | HR 96 | Temp 99.1°F | Resp 16 | Ht 61.0 in | Wt 154.2 lb

## 2021-10-27 DIAGNOSIS — C19 Malignant neoplasm of rectosigmoid junction: Secondary | ICD-10-CM

## 2021-10-27 DIAGNOSIS — E1122 Type 2 diabetes mellitus with diabetic chronic kidney disease: Secondary | ICD-10-CM | POA: Insufficient documentation

## 2021-10-27 DIAGNOSIS — N189 Chronic kidney disease, unspecified: Secondary | ICD-10-CM | POA: Insufficient documentation

## 2021-10-27 DIAGNOSIS — Z5111 Encounter for antineoplastic chemotherapy: Secondary | ICD-10-CM | POA: Diagnosis present

## 2021-10-27 DIAGNOSIS — I129 Hypertensive chronic kidney disease with stage 1 through stage 4 chronic kidney disease, or unspecified chronic kidney disease: Secondary | ICD-10-CM | POA: Diagnosis not present

## 2021-10-27 DIAGNOSIS — Z79899 Other long term (current) drug therapy: Secondary | ICD-10-CM | POA: Diagnosis not present

## 2021-10-27 DIAGNOSIS — Z90710 Acquired absence of both cervix and uterus: Secondary | ICD-10-CM | POA: Diagnosis not present

## 2021-10-27 DIAGNOSIS — Z89612 Acquired absence of left leg above knee: Secondary | ICD-10-CM | POA: Insufficient documentation

## 2021-10-27 DIAGNOSIS — B182 Chronic viral hepatitis C: Secondary | ICD-10-CM | POA: Insufficient documentation

## 2021-10-27 DIAGNOSIS — D500 Iron deficiency anemia secondary to blood loss (chronic): Secondary | ICD-10-CM | POA: Diagnosis present

## 2021-10-27 DIAGNOSIS — E1136 Type 2 diabetes mellitus with diabetic cataract: Secondary | ICD-10-CM | POA: Insufficient documentation

## 2021-10-27 DIAGNOSIS — D631 Anemia in chronic kidney disease: Secondary | ICD-10-CM | POA: Insufficient documentation

## 2021-10-27 DIAGNOSIS — G4700 Insomnia, unspecified: Secondary | ICD-10-CM | POA: Diagnosis not present

## 2021-10-27 DIAGNOSIS — K219 Gastro-esophageal reflux disease without esophagitis: Secondary | ICD-10-CM | POA: Diagnosis not present

## 2021-10-27 LAB — CBC WITH DIFFERENTIAL (CANCER CENTER ONLY)
Abs Immature Granulocytes: 0.06 10*3/uL (ref 0.00–0.07)
Basophils Absolute: 0 10*3/uL (ref 0.0–0.1)
Basophils Relative: 1 %
Eosinophils Absolute: 0.2 10*3/uL (ref 0.0–0.5)
Eosinophils Relative: 2 %
HCT: 32.6 % — ABNORMAL LOW (ref 36.0–46.0)
Hemoglobin: 11 g/dL — ABNORMAL LOW (ref 12.0–15.0)
Immature Granulocytes: 1 %
Lymphocytes Relative: 14 %
Lymphs Abs: 1.2 10*3/uL (ref 0.7–4.0)
MCH: 32.4 pg (ref 26.0–34.0)
MCHC: 33.7 g/dL (ref 30.0–36.0)
MCV: 95.9 fL (ref 80.0–100.0)
Monocytes Absolute: 0.8 10*3/uL (ref 0.1–1.0)
Monocytes Relative: 9 %
Neutro Abs: 6.6 10*3/uL (ref 1.7–7.7)
Neutrophils Relative %: 73 %
Platelet Count: 324 10*3/uL (ref 150–400)
RBC: 3.4 MIL/uL — ABNORMAL LOW (ref 3.87–5.11)
RDW: 13 % (ref 11.5–15.5)
WBC Count: 8.8 10*3/uL (ref 4.0–10.5)
nRBC: 0 % (ref 0.0–0.2)

## 2021-10-27 LAB — TOTAL PROTEIN, URINE DIPSTICK
Protein, ur: >2000 mg/dL — AB
Protein, ur: >2000 mg/dL — AB

## 2021-10-27 LAB — IRON AND IRON BINDING CAPACITY (CC-WL,HP ONLY)
Iron: 57 ug/dL (ref 28–170)
Saturation Ratios: 17 % (ref 10.4–31.8)
TIBC: 336 ug/dL (ref 250–450)
UIBC: 279 ug/dL (ref 148–442)

## 2021-10-27 LAB — FERRITIN: Ferritin: 127 ng/mL (ref 11–307)

## 2021-10-27 NOTE — Progress Notes (Signed)
Pt. urine protein >2,000. Per Dr. Alvy Bimler no treatments for today. Pt. to follow up with PCP and has a scheduled appt. with Dr. Burr Medico 6/16. Dr. Ernestina Penna RN notified and to schedule additional infusion appt. for 11/10/21. Instructed pt. and answered questions, pt. states she understands.

## 2021-10-27 NOTE — Patient Instructions (Signed)
[facility]  Discharge Instructions: Thank you for choosing [facility] to provide your oncology and hematology care.   If you have a lab appointment with the [facility], please go directly to the [facility] and check in at the registration area.   Wear comfortable clothing and clothing appropriate for easy access to any Portacath or PICC line.   We strive to give you quality time with your provider. You may need to reschedule your appointment if you arrive late (15 or more minutes).  Arriving late affects you and other patients whose appointments are after yours.  Also, if you miss three or more appointments without notifying the office, you may be dismissed from the clinic at the provider's discretion.      For prescription refill requests, have your pharmacy contact our office and allow 72 hours for refills to be completed.    Today you received the following chemotherapy and/or immunotherapy agent: Bevacizumab (Avastin)    To help prevent nausea and vomiting after your treatment, we encourage you to take your nausea medication as directed.  BELOW ARE SYMPTOMS THAT SHOULD BE REPORTED IMMEDIATELY: *FEVER GREATER THAN 100.4 F (38 C) OR HIGHER *CHILLS OR SWEATING *NAUSEA AND VOMITING THAT IS NOT CONTROLLED WITH YOUR NAUSEA MEDICATION *UNUSUAL SHORTNESS OF BREATH *UNUSUAL BRUISING OR BLEEDING *URINARY PROBLEMS (pain or burning when urinating, or frequent urination) *BOWEL PROBLEMS (unusual diarrhea, constipation, pain near the anus) TENDERNESS IN MOUTH AND THROAT WITH OR WITHOUT PRESENCE OF ULCERS (sore throat, sores in mouth, or a toothache) UNUSUAL RASH, SWELLING OR PAIN  UNUSUAL VAGINAL DISCHARGE OR ITCHING   Items with * indicate a potential emergency and should be followed up as soon as possible or go to the Emergency Department if any problems should occur.  Please show the CHEMOTHERAPY ALERT CARD or IMMUNOTHERAPY ALERT CARD at  check-in to the Emergency Department and triage nurse.  Should you have questions after your visit or need to cancel or reschedule your appointment, please contact [facility]  Dept: [phone]  and follow the prompts.  Office hours are 8:00 a.m. to 4:30 p.m. Monday - Friday. Please note that voicemails left after 4:00 p.m. may not be returned until the following business day.  We are closed weekends and major holidays. You have access to a nurse at all times for urgent questions. Please call the main number to the clinic Dept: [phone] and follow the prompts.   For any non-urgent questions, you may also contact your provider using MyChart. We now offer e-Visits for anyone 79 and older to request care online for non-urgent symptoms. For details visit [email or web address].   Also download the MyChart app! Go to the app store, search "MyChart", open the app, select [facility], and log in with your MyChart username and password.  Due to Covid, a mask is required upon entering the hospital/clinic. If you do not have a mask, one will be given to you upon arrival. For doctor visits, patients may have 1 support person aged 63 or older with them. For treatment visits, patients cannot have anyone with them due to current Covid guidelines and our immunocompromised population.

## 2021-10-27 NOTE — Telephone Encounter (Signed)
Patient is requesting a nurse call her back.

## 2021-10-31 ENCOUNTER — Other Ambulatory Visit (HOSPITAL_COMMUNITY): Payer: Self-pay

## 2021-10-31 ENCOUNTER — Other Ambulatory Visit: Payer: Self-pay | Admitting: *Deleted

## 2021-10-31 DIAGNOSIS — I10 Essential (primary) hypertension: Secondary | ICD-10-CM

## 2021-10-31 MED ORDER — MAGNESIUM OXIDE -MG SUPPLEMENT 400 (240 MG) MG PO TABS
400.0000 mg | ORAL_TABLET | Freq: Every day | ORAL | 5 refills | Status: DC
  Filled 2021-10-31: qty 30, 30d supply, fill #0

## 2021-10-31 MED ORDER — LOSARTAN POTASSIUM 100 MG PO TABS
100.0000 mg | ORAL_TABLET | Freq: Every day | ORAL | 1 refills | Status: DC
  Filled 2021-10-31 – 2022-02-26 (×3): qty 90, 90d supply, fill #0

## 2021-10-31 MED ORDER — GABAPENTIN 400 MG PO CAPS
ORAL_CAPSULE | ORAL | 5 refills | Status: DC
  Filled 2021-10-31: qty 120, 30d supply, fill #0
  Filled 2021-11-29 – 2021-12-01 (×3): qty 120, 30d supply, fill #1
  Filled 2021-12-22 – 2022-01-03 (×2): qty 120, 30d supply, fill #2
  Filled 2022-02-01: qty 120, 30d supply, fill #3
  Filled 2022-03-02: qty 120, 30d supply, fill #4
  Filled 2022-04-04: qty 120, 30d supply, fill #5
  Filled ????-??-??: fill #1

## 2021-10-31 MED ORDER — AMLODIPINE BESYLATE 10 MG PO TABS
10.0000 mg | ORAL_TABLET | Freq: Every day | ORAL | 1 refills | Status: DC
  Filled 2021-10-31 – 2022-01-22 (×2): qty 90, 90d supply, fill #0

## 2021-10-31 MED ORDER — METFORMIN HCL 1000 MG PO TABS
1000.0000 mg | ORAL_TABLET | Freq: Two times a day (BID) | ORAL | 1 refills | Status: DC
  Filled 2021-10-31: qty 180, 90d supply, fill #0

## 2021-10-31 MED ORDER — ROSUVASTATIN CALCIUM 20 MG PO TABS
20.0000 mg | ORAL_TABLET | Freq: Every day | ORAL | 1 refills | Status: DC
  Filled 2021-10-31 – 2021-12-25 (×2): qty 90, 90d supply, fill #0
  Filled 2022-04-04: qty 90, 90d supply, fill #1

## 2021-10-31 MED ORDER — FAMOTIDINE 20 MG PO TABS
20.0000 mg | ORAL_TABLET | Freq: Two times a day (BID) | ORAL | 1 refills | Status: DC
  Filled 2021-10-31: qty 60, 30d supply, fill #0
  Filled 2021-12-22: qty 60, 30d supply, fill #1

## 2021-10-31 MED ORDER — CARVEDILOL 3.125 MG PO TABS
3.1250 mg | ORAL_TABLET | Freq: Two times a day (BID) | ORAL | 2 refills | Status: DC
  Filled 2021-10-31: qty 60, 30d supply, fill #0
  Filled 2021-12-22: qty 60, 30d supply, fill #1
  Filled 2022-01-22: qty 60, 30d supply, fill #2

## 2021-10-31 NOTE — Telephone Encounter (Signed)
Call from pt - stated she's changing pharmacy; she will be using [facility]. Stated she prefers Omeprazole instead of Famotidine. Thanks

## 2021-11-02 ENCOUNTER — Other Ambulatory Visit (HOSPITAL_COMMUNITY): Payer: Self-pay

## 2021-11-02 ENCOUNTER — Telehealth: Payer: Self-pay | Admitting: *Deleted

## 2021-11-02 NOTE — Telephone Encounter (Signed)
Call from pt stating she needs her Gabapentin. Inform pt it was refilled on 6/6 and sent to the [facility] per her request (changing pharmacy) . Very appreciative.

## 2021-11-10 ENCOUNTER — Inpatient Hospital Stay (HOSPITAL_BASED_OUTPATIENT_CLINIC_OR_DEPARTMENT_OTHER): Payer: 59 | Admitting: Hematology

## 2021-11-10 ENCOUNTER — Other Ambulatory Visit: Payer: Self-pay

## 2021-11-10 ENCOUNTER — Inpatient Hospital Stay: Payer: 59

## 2021-11-10 ENCOUNTER — Encounter: Payer: Self-pay | Admitting: Hematology

## 2021-11-10 VITALS — BP 159/82 | HR 74 | Temp 98.0°F | Resp 18 | Ht 61.0 in | Wt 155.8 lb

## 2021-11-10 DIAGNOSIS — D500 Iron deficiency anemia secondary to blood loss (chronic): Secondary | ICD-10-CM

## 2021-11-10 DIAGNOSIS — Z5111 Encounter for antineoplastic chemotherapy: Secondary | ICD-10-CM | POA: Diagnosis not present

## 2021-11-10 DIAGNOSIS — C19 Malignant neoplasm of rectosigmoid junction: Secondary | ICD-10-CM

## 2021-11-10 DIAGNOSIS — K922 Gastrointestinal hemorrhage, unspecified: Secondary | ICD-10-CM

## 2021-11-10 LAB — IRON AND IRON BINDING CAPACITY (CC-WL,HP ONLY)
Iron: 47 ug/dL (ref 28–170)
Saturation Ratios: 15 % (ref 10.4–31.8)
TIBC: 323 ug/dL (ref 250–450)
UIBC: 276 ug/dL (ref 148–442)

## 2021-11-10 LAB — CBC WITH DIFFERENTIAL (CANCER CENTER ONLY)
Abs Immature Granulocytes: 0.07 10*3/uL (ref 0.00–0.07)
Basophils Absolute: 0.1 10*3/uL (ref 0.0–0.1)
Basophils Relative: 1 %
Eosinophils Absolute: 0.2 10*3/uL (ref 0.0–0.5)
Eosinophils Relative: 2 %
HCT: 32.1 % — ABNORMAL LOW (ref 36.0–46.0)
Hemoglobin: 10.3 g/dL — ABNORMAL LOW (ref 12.0–15.0)
Immature Granulocytes: 1 %
Lymphocytes Relative: 17 %
Lymphs Abs: 1.2 10*3/uL (ref 0.7–4.0)
MCH: 31.9 pg (ref 26.0–34.0)
MCHC: 32.1 g/dL (ref 30.0–36.0)
MCV: 99.4 fL (ref 80.0–100.0)
Monocytes Absolute: 0.9 10*3/uL (ref 0.1–1.0)
Monocytes Relative: 12 %
Neutro Abs: 4.9 10*3/uL (ref 1.7–7.7)
Neutrophils Relative %: 67 %
Platelet Count: 285 10*3/uL (ref 150–400)
RBC: 3.23 MIL/uL — ABNORMAL LOW (ref 3.87–5.11)
RDW: 13 % (ref 11.5–15.5)
WBC Count: 7.3 10*3/uL (ref 4.0–10.5)
nRBC: 0 % (ref 0.0–0.2)

## 2021-11-10 LAB — TOTAL PROTEIN, URINE DIPSTICK: Protein, ur: 300 mg/dL — AB

## 2021-11-10 LAB — FERRITIN: Ferritin: 73 ng/mL (ref 11–307)

## 2021-11-10 MED ORDER — SODIUM CHLORIDE 0.9 % IV SOLN
350.0000 mg | Freq: Once | INTRAVENOUS | Status: AC
  Administered 2021-11-10: 350 mg via INTRAVENOUS
  Filled 2021-11-10: qty 14

## 2021-11-10 MED ORDER — SODIUM CHLORIDE 0.9 % IV SOLN: Freq: Once | INTRAVENOUS | Status: AC

## 2021-11-10 NOTE — Progress Notes (Signed)
Ok to treat with urine prot 300 today per Dr Burr Medico

## 2021-11-10 NOTE — Progress Notes (Signed)
[place]   [phone] [phone]   Clinic Follow up Note   Patient Care Team: Jose Persia, MD as PCP - Evalina Field, MD as Consulting Physician (Ophthalmology) Coralie Keens, MD as Consulting Physician (General Surgery) Truitt Merle, MD as Consulting Physician (Hematology) Comer, Okey Regal, MD as Consulting Physician (Infectious Diseases) Loletha Carrow Kirke Corin, MD as Consulting Physician (Gastroenterology)  Date of Service:  11/10/2021  CHIEF COMPLAINT: f/u of anemia  CURRENT THERAPY:  -IV Feraheme as needed, last 05/26/21 -Blood transfusions as needed, last 08/05/20  ASSESSMENT & PLAN:  [name] is a 71 y.o. female with   1. Iron deficiency anemia secondary to GI blood loss, and Anemia of Chronic Disease -She has a longstanding history of intermittent anemia dating back at least 2008 Hg was 10. - History of multiple GI bleeding in 2016, required blood transfusion, hospitalization and IV Feraheme -She had an EGD and colonoscopy on 10/29/2017 and 03/17/2018 with Dr. Loletha Carrow, which were normal. Endoscopy in [facility] and was found to have AVM in small bowel. -Due to recurrent anemia requiring IV Feraheme and blood transfusions, she started bevacizumab for GI bleeding on 08/10/20. She responded very well with resolved anemia and less need for iv iron. This has been held intermittently due to elevated urine protein. Last dose given 06/08/21. -labs reviewed, hgb down to 10.3 today. Urine protein is 300, will proceed with beva today. -We will continue to monitor.   2. Comorbidities: DM2, insomnia, GERD, chronic Hep C infection, cocaine use -Management per PCP's office.  -GERD uncontrolled on Tums and omeprazole 20 mg once daily -positive for hep C in 2009, received treatment in 2016. 05/2018 liver US unremarkable -h/o cocaine use. She reports recent cocaine use in the last week (11/10/21). I again advised her to discontinue use due to  effects on her health.   3. Left AKA, has prosthesis   4. CKD and proteinuria -Her urine protein was extremely high (>$RemoveBeforeDE'2000mg'JFJcVxjwrMxrAND$ /dl) twice in the past 2 weeks, came down to 300 again today -Probably related to her diabetes, and substance abuse.  I again encouraged her to stop cocaine and marijuana use. She agrees.  -She will follow-up with her nephrologist   PLAN: -proceed with beva today, and as needed in future for anemia  -lab in 2, 4, 8, and 12 weeks -f/u in 12 weeks   No problem-specific Assessment & Plan notes found for this encounter.   INTERVAL HISTORY:  [name] is here for a follow up of anemia. She was last seen by me on 08/18/21. She presents to the clinic alone. She reports she is doing well overall, denies bleeding.   All other systems were reviewed with the patient and are negative.  MEDICAL HISTORY:  Past Medical History:  Diagnosis Date   Allergic rhinitis    Anemia, iron deficiency 05/03/2011   2/2 GI AVMs - recieves iron infusions as well as periodic red blood cell transfusions   Anxiety    Arteriovenous malformation of duodenum 02/25/2015   CARPAL TUNNEL RELEASE, HX OF 07/22/2008   Chest pain 10/28/2019   Colon polyps    Diabetes mellitus without complication ([place])    type 2   Difficulty sleeping    takes trazadone for sleep   Diverticulosis    Fall 01/03/2020   GERD (gastroesophageal reflux disease)    Hepatitis C    in SVR with Harvoni 12/2014-02/2015.    History of hernia repair 08/18/2012   History  of porphyria 11/30/2014   Porphyria cutanea tarda in setting of chronic Hepatitis C, frequent  IV iron infusions, and alcohol abuse    Hypertension    Hypocalcemia 10/28/2019   Hypokalemia 09/27/2014   Peripheral vascular disease ([place])    Stroke ([place]) 2010   no residual deficits   Substance use disorder    cocaine   Tachycardia 07/10/2017   Trichomonas vaginitis 04/16/2019    SURGICAL HISTORY: Past Surgical History:  Procedure Laterality Date    ABDOMINAL HYSTERECTOMY     APPLICATION OF WOUND VAC N/A 06/21/2014   Procedure: APPLICATION OF WOUND VAC;  Surgeon: Coralie Keens, MD;  Location: [place];  Service: General;  Laterality: N/A;   CARPAL TUNNEL RELEASE     rt hand   COLONOSCOPY WITH PROPOFOL N/A 03/17/2018   Procedure: COLONOSCOPY WITH PROPOFOL;  Surgeon: Doran Stabler, MD;  [facility]: [facility];  Service: Gastroenterology;  Laterality: N/A;   ENTEROSCOPY N/A 12/04/2012   Procedure: ENTEROSCOPY;  Surgeon: Beryle Beams, MD;  [facility];  Service: Endoscopy;  Laterality: N/A;   ENTEROSCOPY N/A 12/18/2012   Procedure: ENTEROSCOPY;  Surgeon: Beryle Beams, MD;  [facility];  Service: Endoscopy;  Laterality: N/A;   ENTEROSCOPY N/A 02/25/2015   Procedure: ENTEROSCOPY;  Surgeon: Inda Castle, MD;  Location: [facility];  Service: Endoscopy;  Laterality: N/A;   ENTEROSCOPY N/A 10/29/2017   Procedure: ENTEROSCOPY;  Surgeon: Doran Stabler, MD;  [facility];  Service: Gastroenterology;  Laterality: N/A;   ENTEROSCOPY N/A 07/08/2020   Procedure: ENTEROSCOPY;  Surgeon: Milus Banister, MD;  [facility];  Service: Endoscopy;  Laterality: N/A;   ESOPHAGOGASTRODUODENOSCOPY  05/05/2011   Procedure: ESOPHAGOGASTRODUODENOSCOPY (EGD);  Surgeon: Zenovia Jarred, MD;  Location: [facility];  Service: Gastroenterology;  Laterality: N/A;  Dr. Hilarie Fredrickson will do procedure for Dr. Benson Norway Saturday.   ESOPHAGOGASTRODUODENOSCOPY  05/08/2011   Procedure: ESOPHAGOGASTRODUODENOSCOPY (EGD);  Surgeon: Beryle Beams;  [facility];  Service: Endoscopy;  Laterality: N/A;   ESOPHAGOGASTRODUODENOSCOPY  06/07/2011   Procedure: ESOPHAGOGASTRODUODENOSCOPY (EGD);  Surgeon: Beryle Beams, MD;  Location: [facility];  Service: Endoscopy;  Laterality: N/A;   ESOPHAGOGASTRODUODENOSCOPY  12/20/2011   Procedure: ESOPHAGOGASTRODUODENOSCOPY (EGD);  Surgeon: Beryle Beams, MD;  Location: [facility];  Service:  Endoscopy;  Laterality: N/A;   EYE SURGERY Bilateral    cataracts   FLEXIBLE SIGMOIDOSCOPY  12/21/2011   Procedure: FLEXIBLE SIGMOIDOSCOPY;  Surgeon: Beryle Beams, MD;  [facility];  Service: Endoscopy;  Laterality: N/A;   HOT HEMOSTASIS  06/07/2011   Procedure: HOT HEMOSTASIS (ARGON PLASMA COAGULATION/BICAP);  Surgeon: Beryle Beams, MD;  Location: [facility];  Service: Endoscopy;  Laterality: N/A;   HOT HEMOSTASIS N/A 07/08/2020   Procedure: HOT HEMOSTASIS (ARGON PLASMA COAGULATION/BICAP);  Surgeon: Milus Banister, MD;  Location: [facility];  Service: Endoscopy;  Laterality: N/A;   INCISIONAL HERNIA REPAIR N/A 06/01/2014   Procedure: ATTEMPTED LAPAROSCOPIC AND OPEN INCISIONAL HERNIA REPAIR WITH MESH;  Surgeon: Coralie Keens, MD;  Location: [place];  Service: General;  Laterality: N/A;   INSERTION OF MESH N/A 06/01/2014   Procedure: INSERTION OF MESH;  Surgeon: Coralie Keens, MD;  Location: [place];  Service: General;  Laterality: N/A;   LAPAROTOMY  02/16/2012   Procedure: EXPLORATORY LAPAROTOMY;  Surgeon: Edward Jolly, MD;  [facility];  Service: General;  Laterality: N/A;  oversewing of anastomotic leak and rigid sigmoidoscopy   LAPAROTOMY N/A 06/21/2014   Procedure: ABDOMINAL  WOUND EXPLORATION;  Surgeon: Coralie Keens, MD;  Location: [place];  Service: General;  Laterality: N/A;   LEG AMPUTATION ABOVE KNEE     left   PARTIAL COLECTOMY  02/15/2012   Procedure: PARTIAL COLECTOMY;  Surgeon: Harl Bowie, MD;  [facility];  Service: General;  Laterality: N/A;   SUBMUCOSAL INJECTION  07/08/2020   Procedure: SUBMUCOSAL INJECTION;  Surgeon: Milus Banister, MD;  [facility];  Service: Endoscopy;;   TONSILLECTOMY     WRIST FUSION Right 04/27/2021   Procedure: SCAPHOID EXCISION  WITH 4 BONE FUSION RIGHT WRIST;  Surgeon: Leanora Cover, MD;  Location: [place];  Service: Orthopedics;  Laterality: Right;    I have reviewed the social  history and family history with the patient and they are unchanged from previous note.  ALLERGIES:  is allergic to ciprofloxacin, morphine and related, penicillins, codeine, lisinopril, and feraheme [ferumoxytol].  MEDICATIONS:  Current Outpatient Medications  Medication Sig Dispense Refill   ACCU-CHEK FASTCLIX LANCETS MISC Use to test blood glucose 3 times daily. 100 each 11   amLODipine (NORVASC) 10 MG tablet Take 1 tablet (10 mg total) by mouth daily. 90 tablet 1   Blood Glucose Monitoring Suppl (ACCU-CHEK GUIDE) w/Device KIT 1 each by Does not apply route 3 (three) times daily. The patient is insulin requiring, ICD 10 code E11.65. The patient tests 3 times a day. 1 kit 1   calcium-vitamin D (OSCAL WITH D) 500-200 MG-UNIT tablet Take 1 tablet by mouth 2 (two) times daily. 60 tablet 0   carvedilol (COREG) 3.125 MG tablet Take 1 tablet (3.125 mg total) by mouth 2 (two) times daily. 60 tablet 2   colchicine 0.6 MG tablet Take 0.6 mg by mouth daily as needed.     Dulaglutide (TRULICITY) 2.69 SW/5.4OE SOPN INJECT 0.75 MG INTO THE SKIN ONCE A WEEK. 6 mL 3   Dulaglutide 0.75 MG/0.5ML SOPN Inject 0.75 mg into the skin once a week. Sunday 6 mL 1   ergocalciferol (DRISDOL) 1.25 MG (50000 UT) capsule Take 1 capsule by mouth weekly for 4 weeks then 1 capsule monthly for 4 months 8 capsule 3   famotidine (PEPCID) 20 MG tablet Take 1 tablet (20 mg total) by mouth 2 (two) times daily. 60 tablet 1   gabapentin (NEURONTIN) 400 MG capsule TAKE 1 CAPSULE BY MOUTH EVERY MORNING AND THEN TAKE 1 CAPSULE IN THE AFTERNOON; THEN 2 CAPSULES BY MOUTH BEFORE BEDTIME 120 capsule 5   glucose blood (ACCU-CHEK GUIDE) test strip Use as instructed 100 strip 3   losartan (COZAAR) 100 MG tablet Take 1 tablet (100 mg total) by mouth daily. 90 tablet 1   magnesium oxide (MAG-OX) 400 (240 Mg) MG tablet Take 1 tablet (400 mg total) by mouth daily. 30 tablet 5   metFORMIN (GLUCOPHAGE) 1000 MG tablet Take 1 tablet (1,000 mg total) by  mouth 2 (two) times daily. 180 tablet 1   rosuvastatin (CRESTOR) 20 MG tablet Take 1 tablet (20 mg total) by mouth daily. 90 tablet 1   No current facility-administered medications for this visit.    PHYSICAL EXAMINATION: ECOG PERFORMANCE STATUS: 2 - Symptomatic, <50% confined to bed  Vitals:   11/10/21 1029  BP: (!) 159/82  Pulse: 74  Resp: 18  Temp: 98 F (36.7 C)  SpO2: 100%   Wt Readings from Last 3 Encounters:  11/10/21 155 lb 12.8 oz (70.7 kg)  10/27/21 154 lb 4 oz (70 kg)  07/27/21 155 lb 11.2  oz (70.6 kg)     GENERAL:alert, no distress and comfortable SKIN: skin color normal, no rashes or significant lesions EYES: normal, Conjunctiva are pink and non-injected, sclera clear  NEURO: alert & oriented x 3 with fluent speech  LABORATORY DATA:  I have reviewed the data as listed    Latest Ref Rng & Units 11/10/2021    9:28 AM 10/27/2021    8:22 AM 10/13/2021    9:59 AM  CBC  WBC 4.0 - 10.5 K/uL 7.3  8.8  7.4   Hemoglobin 12.0 - 15.0 g/dL 10.3  11.0  10.5   Hematocrit 36.0 - 46.0 % 32.1  32.6  31.1   Platelets 150 - 400 K/uL 285  324  264         Latest Ref Rng & Units 05/11/2021    9:02 AM 04/24/2021    9:00 AM 03/30/2021   10:01 AM  CMP  Glucose 70 - 99 mg/dL 106  157  126   BUN 8 - 23 mg/dL 17  32  13   Creatinine 0.44 - 1.00 mg/dL 1.11  1.61  1.09   Sodium 135 - 145 mmol/L 141  138  141   Potassium 3.5 - 5.1 mmol/L 4.0  4.4  3.7   Chloride 98 - 111 mmol/L 108  105  101   CO2 22 - 32 mmol/L $RemoveB'23  25  23   'bcQfSsCj$ Calcium 8.9 - 10.3 mg/dL 8.8  9.0  8.9   Total Protein 6.5 - 8.1 g/dL 6.6     Total Bilirubin 0.3 - 1.2 mg/dL <0.2     Alkaline Phos 38 - 126 U/L 55     AST 15 - 41 U/L 16     ALT 0 - 44 U/L 10         RADIOGRAPHIC STUDIES: I have personally reviewed the radiological images as listed and agreed with the findings in the report. No results found.    No orders of the defined types were placed in this encounter.  All questions were answered. The  patient knows to call the clinic with any problems, questions or concerns. No barriers to learning was detected. The total time spent in the appointment was 30 minutes.     Truitt Merle, MD 11/10/2021   I, Wilburn Mylar, am acting as scribe for Truitt Merle, MD.   I have reviewed the above documentation for accuracy and completeness, and I agree with the above.

## 2021-11-10 NOTE — Patient Instructions (Signed)
[facility] MEDICAL ONCOLOGY  Discharge Instructions: Thank you for choosing [facility] to provide your oncology and hematology care.   If you have a lab appointment with the Cancer Center, please go directly to the Cancer Center and check in at the registration area.   Wear comfortable clothing and clothing appropriate for easy access to any Portacath or PICC line.   We strive to give you quality time with your provider. You may need to reschedule your appointment if you arrive late (15 or more minutes).  Arriving late affects you and other patients whose appointments are after yours.  Also, if you miss three or more appointments without notifying the office, you may be dismissed from the clinic at the provider's discretion.      For prescription refill requests, have your pharmacy contact our office and allow 72 hours for refills to be completed.    Today you received the following chemotherapy and/or immunotherapy agents: Bevacizumab      To help prevent nausea and vomiting after your treatment, we encourage you to take your nausea medication as directed.  BELOW ARE SYMPTOMS THAT SHOULD BE REPORTED IMMEDIATELY: *FEVER GREATER THAN 100.4 F (38 C) OR HIGHER *CHILLS OR SWEATING *NAUSEA AND VOMITING THAT IS NOT CONTROLLED WITH YOUR NAUSEA MEDICATION *UNUSUAL SHORTNESS OF BREATH *UNUSUAL BRUISING OR BLEEDING *URINARY PROBLEMS (pain or burning when urinating, or frequent urination) *BOWEL PROBLEMS (unusual diarrhea, constipation, pain near the anus) TENDERNESS IN MOUTH AND THROAT WITH OR WITHOUT PRESENCE OF ULCERS (sore throat, sores in mouth, or a toothache) UNUSUAL RASH, SWELLING OR PAIN  UNUSUAL VAGINAL DISCHARGE OR ITCHING   Items with * indicate a potential emergency and should be followed up as soon as possible or go to the Emergency Department if any problems should occur.  Please show the CHEMOTHERAPY ALERT CARD or IMMUNOTHERAPY ALERT CARD at check-in  to the Emergency Department and triage nurse.  Should you have questions after your visit or need to cancel or reschedule your appointment, please contact [facility]ICAL [facility]COL[facility]  Dept: [phone]  and follow the prompts.  Office hours are 8:00 a.m. to 4:30 p.m. Monday - Friday. Please note that voicemails left after 4:00 p.m. may not be returned until the following business day.  We are closed weekends and major holidays. You have access to a nurse at all times for urgent questions. Please call the main number to the clinic Dept: [phone] and follow the prompts.   For any non-urgent questions, you may also contact your provider using MyChart. We now offer e-Visits for anyone 18 and older to request care online for non-urgent symptoms. For details visit [email or web address].   Also download the MyChart app! Go to the app store, search "MyChart", open the app, select [facility], and log in with your MyChart username and password.  Masks are optional in the cancer centers. If you would like for your care team to wear a mask while they are taking care of you, please let them know. For doctor visits, patients may have with them one support person who is at least 69 years old. At this time, visitors are not allowed in the infusion area. 

## 2021-11-15 ENCOUNTER — Encounter: Payer: 59 | Admitting: Internal Medicine

## 2021-11-21 ENCOUNTER — Other Ambulatory Visit: Payer: Self-pay

## 2021-11-21 ENCOUNTER — Ambulatory Visit (HOSPITAL_COMMUNITY)
Admission: RE | Admit: 2021-11-21 | Discharge: 2021-11-21 | Disposition: A | Discharge status: Home / Self Care | Payer: 59 | Source: Ambulatory Visit | Attending: Internal Medicine | Admitting: Internal Medicine

## 2021-11-21 ENCOUNTER — Ambulatory Visit (INDEPENDENT_AMBULATORY_CARE_PROVIDER_SITE_OTHER): Payer: 59 | Admitting: Internal Medicine

## 2021-11-21 ENCOUNTER — Other Ambulatory Visit (HOSPITAL_COMMUNITY): Payer: Self-pay

## 2021-11-21 ENCOUNTER — Encounter: Payer: Self-pay | Admitting: Internal Medicine

## 2021-11-21 VITALS — BP 187/83 | HR 63 | Temp 98.1°F | Ht 63.0 in

## 2021-11-21 DIAGNOSIS — E876 Hypokalemia: Secondary | ICD-10-CM

## 2021-11-21 DIAGNOSIS — K219 Gastro-esophageal reflux disease without esophagitis: Secondary | ICD-10-CM

## 2021-11-21 DIAGNOSIS — R1032 Left lower quadrant pain: Secondary | ICD-10-CM | POA: Diagnosis present

## 2021-11-21 DIAGNOSIS — R808 Other proteinuria: Secondary | ICD-10-CM

## 2021-11-21 DIAGNOSIS — F339 Major depressive disorder, recurrent, unspecified: Secondary | ICD-10-CM

## 2021-11-21 DIAGNOSIS — F17210 Nicotine dependence, cigarettes, uncomplicated: Secondary | ICD-10-CM

## 2021-11-21 DIAGNOSIS — I10 Essential (primary) hypertension: Secondary | ICD-10-CM

## 2021-11-21 DIAGNOSIS — Z7984 Long term (current) use of oral hypoglycemic drugs: Secondary | ICD-10-CM

## 2021-11-21 DIAGNOSIS — F1990 Other psychoactive substance use, unspecified, uncomplicated: Secondary | ICD-10-CM

## 2021-11-21 DIAGNOSIS — E1140 Type 2 diabetes mellitus with diabetic neuropathy, unspecified: Secondary | ICD-10-CM

## 2021-11-21 LAB — CBC WITH DIFFERENTIAL/PLATELET
Abs Immature Granulocytes: 0.04 10*3/uL (ref 0.00–0.07)
Basophils Absolute: 0 10*3/uL (ref 0.0–0.1)
Basophils Relative: 0 %
Eosinophils Absolute: 0.1 10*3/uL (ref 0.0–0.5)
Eosinophils Relative: 1 %
HCT: 36.5 % (ref 36.0–46.0)
Hemoglobin: 12.3 g/dL (ref 12.0–15.0)
Immature Granulocytes: 1 %
Lymphocytes Relative: 12 %
Lymphs Abs: 1.1 10*3/uL (ref 0.7–4.0)
MCH: 32.5 pg (ref 26.0–34.0)
MCHC: 33.7 g/dL (ref 30.0–36.0)
MCV: 96.3 fL (ref 80.0–100.0)
Monocytes Absolute: 0.9 10*3/uL (ref 0.1–1.0)
Monocytes Relative: 10 %
Neutro Abs: 6.6 10*3/uL (ref 1.7–7.7)
Neutrophils Relative %: 76 %
Platelets: 261 10*3/uL (ref 150–400)
RBC: 3.79 MIL/uL — ABNORMAL LOW (ref 3.87–5.11)
RDW: 12.5 % (ref 11.5–15.5)
WBC: 8.7 10*3/uL (ref 4.0–10.5)
nRBC: 0 % (ref 0.0–0.2)

## 2021-11-21 LAB — COMPREHENSIVE METABOLIC PANEL
ALT: 17 U/L (ref 0–44)
AST: 19 U/L (ref 15–41)
Albumin: 3.4 g/dL — ABNORMAL LOW (ref 3.5–5.0)
Alkaline Phosphatase: 73 U/L (ref 38–126)
Anion gap: 9 (ref 5–15)
BUN: 13 mg/dL (ref 8–23)
CO2: 24 mmol/L (ref 22–32)
Calcium: 9 mg/dL (ref 8.9–10.3)
Chloride: 105 mmol/L (ref 98–111)
Creatinine, Ser: 1.25 mg/dL — ABNORMAL HIGH (ref 0.44–1.00)
GFR, Estimated: 47 mL/min — ABNORMAL LOW (ref >60)
Glucose, Bld: 108 mg/dL — ABNORMAL HIGH (ref 70–99)
Potassium: 3.4 mmol/L — ABNORMAL LOW (ref 3.5–5.1)
Sodium: 138 mmol/L (ref 135–145)
Total Bilirubin: 0.8 mg/dL (ref 0.3–1.2)
Total Protein: 6.4 g/dL — ABNORMAL LOW (ref 6.5–8.1)

## 2021-11-21 LAB — MAGNESIUM: Magnesium: 1.7 mg/dL (ref 1.7–2.4)

## 2021-11-21 MED ORDER — METRONIDAZOLE 500 MG PO TABS
500.0000 mg | ORAL_TABLET | Freq: Three times a day (TID) | ORAL | 0 refills | Status: DC
  Filled 2021-11-21: qty 42, 14d supply, fill #0

## 2021-11-21 MED ORDER — HYDROCODONE-ACETAMINOPHEN 5-325 MG PO TABS
1.0000 | ORAL_TABLET | Freq: Four times a day (QID) | ORAL | 0 refills | Status: AC | PRN
  Filled 2021-11-21: qty 12, 3d supply, fill #0

## 2021-11-21 MED ORDER — IOHEXOL 300 MG/ML  SOLN
100.0000 mL | Freq: Once | INTRAMUSCULAR | Status: AC | PRN
  Administered 2021-11-21: 80 mL via INTRAVENOUS

## 2021-11-21 MED ORDER — OMEPRAZOLE 20 MG PO CPDR
20.0000 mg | DELAYED_RELEASE_CAPSULE | Freq: Every day | ORAL | 2 refills | Status: DC
  Filled 2021-11-21: qty 30, 30d supply, fill #0
  Filled 2021-12-22: qty 30, 30d supply, fill #1
  Filled 2022-01-22: qty 30, 30d supply, fill #2

## 2021-11-21 MED ORDER — SULFAMETHOXAZOLE-TRIMETHOPRIM 800-160 MG PO TABS
1.0000 | ORAL_TABLET | Freq: Two times a day (BID) | ORAL | 0 refills | Status: DC
  Filled 2021-11-21: qty 28, 14d supply, fill #0

## 2021-11-21 MED ORDER — ONDANSETRON HCL 4 MG PO TABS
4.0000 mg | ORAL_TABLET | Freq: Three times a day (TID) | ORAL | 0 refills | Status: DC | PRN
  Filled 2021-11-21: qty 20, 7d supply, fill #0

## 2021-11-21 NOTE — Assessment & Plan Note (Addendum)
[name] presents today with a 3-day history of severe, rapid onset abdominal pain.  Abdominal pain is predominantly epigastric and left lower quadrant, however left lower quadrant is much more severe.  She endorses nausea with 4 episodes of nonbilious, nonbloody emesis yesterday.  She endorses significant diarrhea with a episodes yesterday.  Diarrhea was green in color with no melena or hematochezia.  She denies any fever, chills, shortness of breath, chest pain.  She has noticed decreased urinary frequency but denies dysuria.  She has been unable to tolerate any food intake, but has been able to drink water.  Assessment/plan: On examination, there is decreased bowel sounds and significant tenderness to palpation in the left lower quadrant with guarding.  There is some mild epigastric tenderness to palpation as well.  She is afebrile with no hypotension; no signs of sepsis at this time.    Primary differential at this time includes acute diverticulitis.  Per chart review, patient had a past episode of diverticulitis requiring sigmoid colon resection in 2016.  Given severity of illness and significant comorbidities, I strongly encouraged [name] to go to the ED or be directly admitted to our service.  I explained that diverticulitis can turn into a surgical and medical emergency.  [name] states she understands this but is not willing to go to the ED or be admitted.  Given high risk for complications, we will start with antimicrobial therapy today as well as pursue stat CT abdomen/pelvis.  Stat labs were obtained that do not show any evidence of leukocytosis, acute kidney injury, severe electrolyte derangements.  - Start Bactrim (levofloxacin and Augmentin contraindicated due to allergies)  - Start metronidazole - Start Zofran as needed for nausea - Start Norco as needed for severe pain - Recommended she go to the ED immediately if she is unable to tolerate these medications or her  symptoms worsen - Follow-up in 1 week, however I encouraged her to contact the clinic if she is not feeling better in the next 2 to 3 days.  ADDENDUM:  CT scan without evidence of acute diverticulitis. There was possible duodenal thickening consistent with duodenitis. Attempted to contact [name] to instruct her to stop Flagyl and Bactrim, restart home Omeprazole and offer Carafate for symptomatic relief. No answer but voicemail left.

## 2021-11-21 NOTE — Assessment & Plan Note (Signed)
Since her last visit, [name] has followed up with nephrology, Dr. Glenna Fellows.  Given strong family history of FSGS, nephrology suspects underlying cause of nephrotic syndrome is FSGS rather than diabetic nephropathy.  At that time, she was instructed to restart an SGLT2 inhibitor, however I do not see this on her medication list today.  We did not have a chance to discuss this in depth due to her severe abdominal pain, so we will need to address this at her next visit.

## 2021-11-21 NOTE — Assessment & Plan Note (Signed)
Magnesium is 1.7 today.  No further interventions indicated.

## 2021-11-21 NOTE — Assessment & Plan Note (Signed)
Mr. Fuoco endorses using cocaine once in the last week.  She continues working on cessation.  We will need to discuss this further at her next visit.

## 2021-11-21 NOTE — Assessment & Plan Note (Signed)
>>  ASSESSMENT AND PLAN FOR TYPE 2 DIABETES MELLITUS WITH DIABETIC NEUROPATHY ([facility]) WRITTEN ON 11/21/2021 11:19 AM BY ARNETT SAUNDERS, MD  We did not have an opportunity to discuss diabetes today due to competing priorities.  We will need to discuss at next visit.

## 2021-11-21 NOTE — Assessment & Plan Note (Addendum)
[name] states she does not experience relief with famotidine and would like to switch back to omeprazole.    Assessment/Plan: We discussed that omeprazole was discontinued due to her low magnesium.  We will check magnesium today.  If within normal limits, can switch back to omeprazole.  Addendum: Magnesium 1.7.  Omeprazole 20 mg once daily sent to pharmacy

## 2021-11-21 NOTE — Progress Notes (Signed)
CC: Abdominal pain, nausea/vomiting, diarrhea   HPI:  [name] is a 71 y.o. with a PMHx as listed below who presents to the clinic for Abdominal pain, nausea/vomiting, diarrhea .   Please see the Encounters tab for problem-based Assessment & Plan regarding status of patient's acute and chronic conditions.  Past Medical History:  Diagnosis Date   Allergic rhinitis    Anemia, iron deficiency 05/03/2011   2/2 GI AVMs - recieves iron infusions as well as periodic red blood cell transfusions   Anxiety    Arteriovenous malformation of duodenum 02/25/2015   CARPAL TUNNEL RELEASE, HX OF 07/22/2008   Chest pain 10/28/2019   Colon polyps    Diabetes mellitus without complication ([facility])    type 2   Difficulty sleeping    takes trazadone for sleep   Diverticulosis    Fall 01/03/2020   GERD (gastroesophageal reflux disease)    Hepatitis C    in SVR with Harvoni 12/2014-02/2015.    History of hernia repair 08/18/2012   History of porphyria 11/30/2014   Porphyria cutanea tarda in setting of chronic Hepatitis C, frequent  IV iron infusions, and alcohol abuse    Hypertension    Hypocalcemia 10/28/2019   Hypokalemia 09/27/2014   Peripheral vascular disease ([facility])    Stroke ([facility]) 2010   no residual deficits   Substance use disorder    cocaine   Tachycardia 07/10/2017   Trichomonas vaginitis 04/16/2019   Review of Systems: Review of Systems  Constitutional:  Positive for malaise/fatigue. Negative for chills, fever and weight loss.  Respiratory:  Negative for cough and shortness of breath.   Cardiovascular:  Negative for chest pain and leg swelling.  Gastrointestinal:  Positive for abdominal pain, diarrhea, nausea and vomiting. Negative for blood in stool, constipation and melena.  Genitourinary:  Positive for frequency (decreased). Negative for dysuria, flank pain, hematuria and urgency.  Skin:  Negative for itching and rash.  Neurological:  Negative for dizziness and headaches.   Psychiatric/Behavioral:  Positive for depression. Negative for suicidal ideas.    Physical Exam:  Vitals:   11/21/21 0838 11/21/21 0913  BP: (!) 165/86 (!) 187/83  Pulse: 77 63  Temp: 98.1 F (36.7 C)   TempSrc: Oral   SpO2: 99%   Height: 5\' 3"  (1.6 m)    Physical Exam Vitals and nursing note reviewed.  Constitutional:      General: She is not in acute distress.    Appearance: She is normal weight.  HENT:     Head: Normocephalic and atraumatic.     Mouth/Throat:     Mouth: Mucous membranes are moist.     Pharynx: Oropharynx is clear.  Eyes:     Extraocular Movements: Extraocular movements intact.     Pupils: Pupils are equal, round, and reactive to light.  Cardiovascular:     Rate and Rhythm: Normal rate and regular rhythm.  Pulmonary:     Effort: Pulmonary effort is normal. No respiratory distress.     Breath sounds: Normal breath sounds.  Abdominal:     General: Bowel sounds are decreased. There is no distension.     Palpations: Abdomen is soft.     Tenderness: There is abdominal tenderness (Mild TTP along the epigastric region with severe TTP in the LLQ) in the epigastric area and left lower quadrant. There is guarding (LLQ). There is no rebound.     Hernia: No hernia is present.  Skin:    General: Skin is warm and  dry.  Neurological:     General: No focal deficit present.     Mental Status: She is alert and oriented to person, place, and time. Mental status is at baseline.  Psychiatric:        Mood and Affect: Mood is anxious. Affect is tearful.        Speech: Speech normal.        Behavior: Behavior is cooperative.    Assessment & Plan:   See Encounters Tab for problem based charting.  Patient discussed with Dr. Criselda Peaches

## 2021-11-23 ENCOUNTER — Encounter: Payer: Self-pay | Admitting: Physical Therapy

## 2021-11-23 ENCOUNTER — Telehealth: Payer: Self-pay | Admitting: Internal Medicine

## 2021-11-23 ENCOUNTER — Ambulatory Visit: Payer: 59 | Attending: Internal Medicine | Admitting: Physical Therapy

## 2021-11-23 DIAGNOSIS — Z7689 Persons encountering health services in other specified circumstances: Secondary | ICD-10-CM | POA: Diagnosis not present

## 2021-11-23 DIAGNOSIS — M6281 Muscle weakness (generalized): Secondary | ICD-10-CM | POA: Insufficient documentation

## 2021-11-23 DIAGNOSIS — R2689 Other abnormalities of gait and mobility: Secondary | ICD-10-CM | POA: Diagnosis present

## 2021-11-23 NOTE — Telephone Encounter (Signed)
Recent visit for acute LLQ pain concerning for acute diverticulitis. Attempted to call to discuss CT scan results and next steps recommended. CT scan without evidence of acute diverticulitis. There was possible duodenal thickening consistent with duodenitis.   Attempted to contact [name] to instruct her to stop Flagyl and Bactrim, restart home Omeprazole and offer Carafate for symptomatic relief.   No answer but voicemail left.

## 2021-11-23 NOTE — Therapy (Addendum)
[facility] [address] [place], [place], [address] Phone: [phone]   Fax:  [phone]  Physical Therapy Evaluation  Patient Details  Name: [name] MRN: [id number] Date of Birth: 28-Nov-1950 Referring Provider (PT): Jose Persia, MD Cc:  Gilles Chiquito, MD  Encounter Date: 11/23/2021   PT End of Session - 11/23/21 1624     Visit Number 1    Number of Visits 1    Authorization Type [employer]    Authorization Time Period 11-23-21    PT Start Time 0936    PT Stop Time 1025    PT Time Calculation (min) 49 min    Activity Tolerance Patient tolerated treatment well    Behavior During Therapy [facility] for tasks assessed/performed             Past Medical History:  Diagnosis Date   Allergic rhinitis    Anemia, iron deficiency 05/03/2011   2/2 GI AVMs - recieves iron infusions as well as periodic red blood cell transfusions   Anxiety    Arteriovenous malformation of duodenum 02/25/2015   CARPAL TUNNEL RELEASE, HX OF 07/22/2008   Chest pain 10/28/2019   Colon polyps    Diabetes mellitus without complication ([place])    type 2   Difficulty sleeping    takes trazadone for sleep   Diverticulosis    Fall 01/03/2020   GERD (gastroesophageal reflux disease)    GI bleed 07/07/2020   Hepatitis C    in SVR with [facility] 12/2014-02/2015.    History of hernia repair 08/18/2012   History of porphyria 11/30/2014   Porphyria cutanea tarda in setting of chronic Hepatitis C, frequent  IV iron infusions, and alcohol abuse    Hypertension    Hypocalcemia 10/28/2019   Hypokalemia 09/27/2014   Peripheral vascular disease ([place])    Stroke ([place]) 2010   no residual deficits   Substance use disorder    cocaine   Tachycardia 07/10/2017   Trichomonas vaginitis 04/16/2019    Past Surgical History:  Procedure Laterality Date   ABDOMINAL HYSTERECTOMY     APPLICATION OF WOUND VAC N/A 06/21/2014   Procedure: APPLICATION OF WOUND VAC;  Surgeon:  Coralie Keens, MD;  Location: [facility];  Service: General;  Laterality: N/A;   CARPAL TUNNEL RELEASE     rt hand   COLONOSCOPY WITH PROPOFOL N/A 03/17/2018   Procedure: COLONOSCOPY WITH PROPOFOL;  Surgeon: Doran Stabler, MD;  [facility];  Service: Gastroenterology;  Laterality: N/A;   ENTEROSCOPY N/A 12/04/2012   Procedure: ENTEROSCOPY;  Surgeon: Beryle Beams, MD;  [facility];  Service: Endoscopy;  Laterality: N/A;   ENTEROSCOPY N/A 12/18/2012   Procedure: ENTEROSCOPY;  Surgeon: Beryle Beams, MD;  [facility];  Service: Endoscopy;  Laterality: N/A;   ENTEROSCOPY N/A 02/25/2015   Procedure: ENTEROSCOPY;  Surgeon: Inda Castle, MD;  Location: [facility];  Service: Endoscopy;  Laterality: N/A;   ENTEROSCOPY N/A 10/29/2017   Procedure: ENTEROSCOPY;  Surgeon: Doran Stabler, MD;  [facility];  Service: Gastroenterology;  Laterality: N/A;   ENTEROSCOPY N/A 07/08/2020   Procedure: ENTEROSCOPY;  Surgeon: Milus Banister, MD;  [facility];  Service: Endoscopy;  Laterality: N/A;   ESOPHAGOGASTRODUODENOSCOPY  05/05/2011   Procedure: ESOPHAGOGASTRODUODENOSCOPY (EGD);  Surgeon: Zenovia Jarred, MD;  Location: [facility];  Service: Gastroenterology;  Laterality: N/A;  Dr. Hilarie Fredrickson will do procedure for Dr. Benson Norway Saturday.   ESOPHAGOGASTRODUODENOSCOPY  05/08/2011   Procedure:  ESOPHAGOGASTRODUODENOSCOPY (EGD);  Surgeon: Beryle Beams;  [facility];  Service: Endoscopy;  Laterality: N/A;   ESOPHAGOGASTRODUODENOSCOPY  06/07/2011   Procedure: ESOPHAGOGASTRODUODENOSCOPY (EGD);  Surgeon: Beryle Beams, MD;  Location: [facility];  Service: Endoscopy;  Laterality: N/A;   ESOPHAGOGASTRODUODENOSCOPY  12/20/2011   Procedure: ESOPHAGOGASTRODUODENOSCOPY (EGD);  Surgeon: Beryle Beams, MD;  Location: [facility];  Service: Endoscopy;  Laterality: N/A;   EYE SURGERY Bilateral    cataracts   FLEXIBLE SIGMOIDOSCOPY  12/21/2011   Procedure:  FLEXIBLE SIGMOIDOSCOPY;  Surgeon: Beryle Beams, MD;  [facility];  Service: Endoscopy;  Laterality: N/A;   HOT HEMOSTASIS  06/07/2011   Procedure: HOT HEMOSTASIS (ARGON PLASMA COAGULATION/BICAP);  Surgeon: Beryle Beams, MD;  Location: [facility];  Service: Endoscopy;  Laterality: N/A;   HOT HEMOSTASIS N/A 07/08/2020   Procedure: HOT HEMOSTASIS (ARGON PLASMA COAGULATION/BICAP);  Surgeon: Milus Banister, MD;  Location: [facility];  Service: Endoscopy;  Laterality: N/A;   INCISIONAL HERNIA REPAIR N/A 06/01/2014   Procedure: ATTEMPTED LAPAROSCOPIC AND OPEN INCISIONAL HERNIA REPAIR WITH MESH;  Surgeon: Coralie Keens, MD;  Location: [facility];  Service: General;  Laterality: N/A;   INSERTION OF MESH N/A 06/01/2014   Procedure: INSERTION OF MESH;  Surgeon: Coralie Keens, MD;  Location: [facility];  Service: General;  Laterality: N/A;   LAPAROTOMY  02/16/2012   Procedure: EXPLORATORY LAPAROTOMY;  Surgeon: Edward Jolly, MD;  [facility];  Service: General;  Laterality: N/A;  oversewing of anastomotic leak and rigid sigmoidoscopy   LAPAROTOMY N/A 06/21/2014   Procedure: ABDOMINAL WOUND EXPLORATION;  Surgeon: Coralie Keens, MD;  Location: [facility];  Service: General;  Laterality: N/A;   LEG AMPUTATION ABOVE KNEE     left   PARTIAL COLECTOMY  02/15/2012   Procedure: PARTIAL COLECTOMY;  Surgeon: Harl Bowie, MD;  Location: [facility];  Service: General;  Laterality: N/A;   SUBMUCOSAL INJECTION  07/08/2020   Procedure: SUBMUCOSAL INJECTION;  Surgeon: Milus Banister, MD;  [facility];  Service: Endoscopy;;   TONSILLECTOMY     WRIST FUSION Right 04/27/2021   Procedure: SCAPHOID EXCISION  WITH 4 BONE FUSION RIGHT WRIST;  Surgeon: Leanora Cover, MD;  Location: [facility];  Service: Orthopedics;  Laterality: Right;    There were no vitals filed for this visit.    Subjective Assessment - 11/23/21 1612     Subjective Pt presents for power wheelchair evaluation  with Josh Cadle, ATP with [facility] present for eval; pt is currently using a group 2 power wheelchair [facility] select)    Currently in Pain? Yes    Pain Score 7     Pain Location Wrist    Pain Orientation Right    Pain Descriptors / Indicators Aching    Pain Type Chronic pain    Pain Onset More than a month ago    Pain Frequency Intermittent                [facility] PT Assessment - 11/23/21 0001       Assessment   Medical Diagnosis s/p Lt Transfemoral Amputation    Referring Provider (PT) Jose Persia, MD    Onset Date/Surgical Date --   2009 for Lt AKA   Hand Dominance Right      Precautions   Precautions Fall      Balance Screen   Has the patient fallen in the past 6 months No    Has the patient had a decrease  in activity level because of a fear of falling?  No    Is the patient reluctant to leave their home because of a fear of falling?  No      Home Environment   Living Environment Private residence    Living Arrangements Alone    Type of [place] One level      Prior Function   Level of Independence Needs assistance with ADLs;Needs assistance with homemaking;Requires assistive device for independence                        Objective measurements completed on examination: See above findings.     LMN for power wheelchair to be completed when quote received from vendor.     Mobility/Seating Evaluation    PATIENT INFORMATION: Name: Arianne Klinge DOB: 09/18/1950  Sex: F Date seen: 11-23-21 Time: 0930  Address:  [address] [place]                 [place], [place] [address]  Physician: Gilles Chiquito, MD;  cc:  Jose Persia, MD This evaluation/justification form will serve as the LMN for the following suppliers: __________________________ Supplier: [employer] Contact Person: Luz Brazen, ATP Phone:  [phone]   Seating Therapist: Guido Sander, PT Phone: [phone]    Phone:  [phone] (home)              [phone] (cell)    Spouse/Parent/Caregiver name:   Phone number:  Insurance/Payer: [facility] Medicare/Medicaid     Reason for Referral: power wheelchair evaluation  Patient/Caregiver Goals: obtain new power wheelchair   Patient was seen for face-to-face evaluation for new power wheelchair.  Also present was [employer], ATP with [facility] to discuss recommendations and wheelchair options.  Further paperwork was completed and sent to vendor.  Patient appears to qualify for power mobility device at this time per objective findings.   MEDICAL HISTORY: Diagnosis: Primary Diagnosis: Lt Transfemoral amputation  Onset: 2009 Diagnosis: DM type 2:  Diverticulosis:  Hepatitis C:  PVD:  HTN:     '[]'$ Progressive Disease Relevant past and future surgeries: Lt AKA 2009:  Rt wrist fusion 04-27-21; h/o carpal tunnel release 07-22-08   Height: 5'1 Weight: 157# Explain recent changes or trends in weight:    History including Falls: Pt is using Music therapist power wheelchair for mobility - has had this wheelchair since 2018; pt reports no falls in past 6 months - states she fell last year and sustained a Rt wrist injury.  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   COMMUNITY ADL: TRANSPORTATION: [] Car    [] Van    [x] Public Transportation    [] [employer] w/c Lift    [] Ambulance    [] Other:       [x] Sits in wheelchair during transport  Employment/School:  Specific requirements pertaining to mobility   Other:     FUNCTIONAL/SENSORY PROCESSING SKILLS:  Handedness:   [x] Right     [] Left    [] NA  Comments:    Functional  Processing Skills for Wheeled Mobility [x] Processing Skills are adequate for safe wheelchair operation  Areas of concern than may interfere with safe operation of wheelchair Description of problem   []  Attention to environment      [] Judgment      []  Hearing  []  Vision or visual processing      [] Motor Planning  []  Fluctuations in Behavior      VERBAL COMMUNICATION: [x] WFL receptive [x]  WFL expressive [] Understandable  [] Difficult to understand  [] non-communicative []  Uses an augmented communication device  CURRENT SEATING / MOBILITY: Current Mobility Base:  [] None [] Dependent [] Manual [] Scooter [x] Power  Type of Control:   Manufacturer:  Public librarian:  18"x18"Age: 5 yrs   Current Condition of Mobility Base:  in disrepair   Current Wheelchair components:    Describe posture in present seating system:        SENSATION and SKIN ISSUES: Sensation [x] Intact  [] Impaired [] Absent  Level of sensation:  Pressure Relief: Able to perform effective pressure relief :    [x] Yes  []  No Method:  If not, Why?:   Skin Issues/Skin Integrity Current Skin Issues  [] Yes [x] No [] Intact []  Red area[]  Open Area  [] Scar Tissue [] At risk from prolonged sitting Where    History of Skin Issues  [] Yes [x] No Where   When    Hx of skin flap surgeries  [] Yes [x] No Where   When    Limited sitting tolerance [] Yes [x] No Hours spent sitting in wheelchair daily: 7-8 hours/day  Complaint of Pain:  Please describe: Rt wrist pain - rates pain 7/10 - states the pain is constant (h/o Rt wrist fusion 04-27-21)    Swelling/Edema: reports intermittent edema in Rt ankle    ADL STATUS (in reference to wheelchair use):  Indep Assist Unable Indep with Equip Not assessed Comments  Dressing []  [x]  []  []  []  needs assist with lower body dressing  Eating []  [x]  []  []  []  needs assist with cutting tough foods  Toileting []  []  []  [x]  []  uses raised commode seat  Bathing []  [x]  []  []  []  uses tub bench  - needs assist  with transfer  Grooming/Hygiene []  []  []  [x]  []  performs from wheelchair  Meal Prep []  []  [x]  []  []    IADLS []  []  [x]  []  []    Bowel Management: [] Continent  [] Incontinent  [x] Accidents Comments:    Bladder Management: [] Continent  [] Incontinent  [x] Accidents Comments:       WHEELCHAIR SKILLS: Manual w/c Propulsion: [] UE or LE strength and endurance sufficient to participate in ADLs using manual wheelchair Arm : [] left [] right   [] Both      Distance:  Foot:  [] left [] right   [] Both  Operate Scooter: []  Strength, hand grip, balance and transfer appropriate for use [] Living environment is accessible for use of scooter  Operate Power w/c:  [x]  Std. Joystick   []  Alternative Controls Indep [x]  Assist []  Dependent/unable []  N/A []   [] Safe          []   Functional      Distance:   Bed confined without wheelchair []  Yes []  No   STRENGTH/RANGE OF MOTION:  Active Range of Motion Strength  Shoulder Bil. shoulder WFL's for flexion & abduction  Rt shoulder flexors 4/5; abdct. 4/5 Lt shoulder flexors 4+/5;  abdct. 4/5  Elbow WFL's bil. elbow flexion and extension 5/5 for bil. elbow flexors & extensors  Wrist/Hand decreased Rt wrist flexion and extenstion due to s/p fusion:  Lt wrist AROM is WFL's Rt wrist 3-/5 Lt wrist WFL's for flexors & extensors  Hip Rt hip flexion WFL's;  Lt hip flexion decreased with pt unable to flex hip to lift prosthesis in seated position  Rt hip flexors 4-/5;  Lt hip flexors NT due to Lt AKA  Knee Rt knee AROM WFL's;  N/A for LLE due to s/p AKA amputation Rt quads 4/4:  Rt hamstrings 4-/5 N/A for Lt knee musc.  Ankle Rt ankle WFL's for AROM:  N/A for Lt ankle  Rt dorsiflexors 4/5;  Lt ankle musc. N/A due to Lt AKA     MOBILITY/BALANCE:  []  Patient is totally dependent for mobility      Balance Transfers Ambulation  Sitting Balance: Standing Balance: [x]  Independent []  Independent/Modified Independent  [x]  WFL     []  WFL []  Supervision []  Supervision  []  Uses UE  for balance  []  Supervision []  Min Assist [x]  Ambulates with Assist  supervision for safety    []  Min Assist [x]  Min assist []  Mod Assist [x]  Ambulates with Device:      [x]  RW  []  StW  []  Cane  []  10'  []  Mod Assist []  Mod assist []  Max assist   []  Max Assist []  Max assist []  Dependent []  Indep. Short Distance Only  []  Unable []  Unable []  Lift / Sling Required Distance (in feet)     []  Sliding board []  Unable to Ambulate (see explanation below)  Cardio Status:  [x] Intact  []  Impaired   []  NA       Respiratory Status:  [x] Intact   [] Impaired   [] NA       Orthotics/Prosthetics: Lt prosthesis; pt states her current prosthesis is undergoing revision    Comments (Address manual vs power w/c vs scooter): Pt is unable to functionally ambulate due to s/p Lt transfemoral amputation. She is unable to functionally and effectively propel a manual wheelchair due to s/p Rt wrist fusion in Dec. 2022 with decreased Rt wrist AROM and strength.  Pt lives alone; she is in need of a power wheelchair for independence and safety with mobility and ADL's in her home environment.  She is at high risk for fall and reports injurious fall was sustained last year, in which she injured her Rt wrist resulting in need for surgery and subsequently wrist fusion.  She is unable to operate a scooter due to inability to transfer on and off the platform of a scooter.  A power wheelchair would significantly increase her safety & independence with mobility and ADL's in her home while reducing the fall risk associated with these activities.         Anterior / Posterior Obliquity Rotation-Pelvis   PELVIS    []  [x]  []   Neutral Posterior Anterior  [x]  []  []   WFL Rt elev Lt elev  [x]  []  []   WFL Right Left                      Anterior  Anterior     []  Fixed []  Other []  Partly Flexible [x]  Flexible   []  Fixed []  Other []  Partly Flexible  [x]  Flexible  []  Fixed []  Other []  Partly Flexible  [x]  Flexible   TRUNK  []  [x]  []   WFL   Thoracic  Lumbar  Kyphosis Lordosis  [x]  []  []   WFL Convex Convex  Right Left [] c-curve [] s-curve [] multiple  [x]  Neutral []  Left-anterior []  Right-anterior     []  Fixed [x]  Flexible []  Partly Flexible []  Other  []  Fixed [x]  Flexible []  Partly Flexible []  Other  []  Fixed             [x]  Flexible []  Partly Flexible []  Other    Position Windswept    HIPS          [x]            []               []    Neutral       Abduct        ADduct         [x]           []            []   Neutral Right           Left      []  Fixed []  Subluxed []  Partly Flexible []  Dislocated [x]  Flexible  []  Fixed []  Other []  Partly Flexible  [x]  Flexible                 Foot Positioning Knee Positioning  N/A for Lt knee and ankle  due to Lt AKA     [x]  WFL  [] Lt [x] Rt [x]  WFL  [] Lt [x] Rt    KNEES ROM concerns: ROM concerns:    & Dorsi-Flexed [] Lt [] Rt     FEET Plantar Flexed [] Lt [] Rt      Inversion                 [] Lt [] Rt      Eversion                 [] Lt [] Rt     HEAD [x]  Functional [x]  Good Head Control    & []  Flexed         []  Extended []  Adequate Head Control    NECK []  Rotated  Lt  []  Lat Flexed Lt []  Rotated  Rt []  Lat Flexed Rt []  Limited Head Control     []  Cervical Hyperextension []  Absent  Head Control     SHOULDERS ELBOWS WRIST& HAND       Left     Right    Left     Right    Left     Right   U/E [x] Functional           [x] Functional   [] Fisting             [] Fisting      [] elev   [] dep      [] elev   [] dep       [] pro -[] retract     [] pro  [] retract [] subluxed             [] subluxed           Goals for Wheelchair Mobility  [x]  Independence with mobility in the home with motor related ADLs (MRADLs)  []  Independence with MRADLs in the community []  Provide dependent mobility  []  Provide recline     []   Provide tilt   Goals for Seating system [x]  Optimize pressure distribution [x]  Provide support needed to facilitate function or safety []  Provide corrective forces to assist with  maintaining or improving posture []  Accommodate client's posture:   current seated postures and positions are not flexible or will not tolerate corrective forces []  Client to be independent with relieving pressure in the wheelchair [] Enhance physiological function such as breathing, swallowing, digestion  Simulation ideas/Equipment trials: State why other equipment was unsuccessful:   MOBILITY BASE RECOMMENDATIONS and JUSTIFICATION: MOBILITY COMPONENT JUSTIFICATION  Manufacturer: JazzyModel: Select   Size: Width 18Seat Depth 18 [x] provide transport from point A to B      [x] promote Indep mobility  [x] is not a safe, functional ambulator [x] walker or cane inadequate [] non-standard width/depth necessary to accommodate anatomical measurement []    [] Manual Mobility Base [] non-functional ambulator    [] Scooter/POV  [] can safely operate  [] can safely transfer   [] has adequate trunk stability  [] cannot functionally propel manual w/c  [x] Power Mobility Base  [] non-ambulatory  [x] cannot functionally propel manual wheelchair  [x]  cannot functionally and safely operate scooter/POV [x] can safely operate and willing to  [] Stroller Base [] infant/child  [] unable to propel manual wheelchair [] allows for growth [] non-functional ambulator [] non-functional UE [] Indep mobility is not a goal at this time  [] Tilt  [] Forward [] Backward [] Powered tilt  [] Manual tilt  [] change position against gravitational force on head and shoulders  [] change position for pressure relief/cannot weight shift [] transfers  [] management of tone [] rest periods [] control edema [] facilitate postural control  []    [] Recline  [] Power recline on power base [] Manual recline on manual base  [] accommodate femur to back angle  [] bring to full recline for ADL care  [] change position for pressure relief/cannot weight shift [] rest periods [] repositioning for transfers or clothing/diaper /catheter changes [] head positioning   [] Lighter weight required [] self- propulsion  [] lifting []    [] Heavy Duty required [] user weight greater than 250# [] extreme tone/ over active movement [] broken frame on previous chair []    [x]  Back  []  Angle Adjustable []  Custom molded Captain's seat [] postural control [] control of tone/spasticity [] accommodation of range of motion [] UE functional control [x] accommodation for seating system []   [] provide lateral trunk support [] accommodate deformity [x] provide posterior trunk support [x] provide lumbar/sacral support [x] support trunk in midline [x] Pressure relief over spinal processes  [x]  Seat Cushion Captain's seat [] impaired sensation  [] decubitus ulcers present [] history of pressure ulceration [] prevent pelvic extension [x] low maintenance  [] stabilize pelvis  [] accommodate obliquity [] accommodate multiple deformity [x] neutralize lower extremity position [x] increase pressure distribution []    []  Pelvic/thigh support  []  Lateral thigh guide []  Distal medial pad  []  Distal lateral pad []  pelvis in neutral [] accommodate pelvis []  position upper legs []  alignment []  accommodate ROM []  decr adduction [] accommodate tone [] removable for transfers [] decr abduction  []  Lateral trunk Supports []  Lt     []  Rt [] decrease lateral trunk leaning [] control tone [] contour for increased contact [] safety  [] accommodate asymmetry []    []  Mounting hardware  [] lateral trunk supports  [] back   [] seat [] headrest      []  thigh support [] fixed   [] swing away [] attach seat platform/cushion to w/c frame [] attach back cushion to w/c frame [] mount postural supports [] mount headrest  [] swing medial thigh support away [] swing lateral supports away for transfers  []      Armrests  [] fixed [x] adjustable height [] removable   [] swing away  [x] flip back   [] reclining [x] full length pads [] desk    [] pads tubular  [x] provide support with elbow at 90   []   provide support for w/c tray [x] change  of height/angles for variable activities [] remove for transfers [x] allow to come closer to table top [] remove for access to tables []    Hangers/ Leg rests  [] 60 [] 70 [] 90 [] elevating [] heavy duty  [] articulating [] fixed [] lift off [] swing away     [] power [] provide LE support  [] accommodate to hamstring tightness [] elevate legs during recline   [] provide change in position for Legs [] Maintain placement of feet on footplate [] durability [] enable transfers [] decrease edema [] Accommodate lower leg length []    Foot support Footplate    [] Lt  []  Rt  [x]  Center mount [x] flip up     [x] depth/angle adjustable [] Amputee adapter    []  Lt     []  Rt [x] provide foot support [x] accommodate to ankle ROM [x] transfers [] Provide support for residual extremity []  allow foot to go under wheelchair base []  decrease tone  []    []  Ankle strap/heel loops [] support foot on foot support [] decrease extraneous movement [] provide input to heel  [] protect foot  Tires: [] pneumatic  [x] flat free inserts  [] solid  [x] decrease maintenance  [x] prevent frequent flats [] increase shock absorbency [] decrease pain from road shock [] decrease spasms from road shock []    []  Headrest  [] provide posterior head support [] provide posterior neck support [] provide lateral head support [] provide anterior head support [] support during tilt and recline [] improve feeding   [] improve respiration [] placement of switches [] safety  [] accommodate ROM  [] accommodate tone [] improve visual orientation  []  Anterior chest strap []  Vest []  Shoulder retractors  [] decrease forward movement of shoulder [] accommodation of TLSO [] decrease forward movement of trunk [] decrease shoulder elevation [] added abdominal support [] alignment [] assistance with shoulder control  []    Pelvic Positioner [x] Belt [] SubASIS bar [] Dual Pull [] stabilize tone [x] decrease falling out of chair/ **will not Decr potential for sliding due to pelvic  tilting [] prevent excessive rotation [] pad for protection over boney prominence [] prominence comfort [] special pull angle to control rotation []    Upper Extremity Support [] L   []  R [] Arm trough    [] hand support []  tray       [] full tray [] swivel mount [] decrease edema      [] decrease subluxation   [] control tone   [] placement for AAC/Computer/EADL [] decrease gravitational pull on shoulders [] provide midline positioning [] provide support to increase UE function [] provide hand support in natural position [] provide work surface   POWER WHEELCHAIR CONTROLS  [x] Proportional  [] Non-Proportional Type Joystick [] Left  [x] Right [x] provides access for controlling wheelchair   [] lacks motor control to operate proportional drive control [] unable to understand proportional controls  Actuator Control Module  [] Single  [] Multiple   [] Allow the client to operate the power seat function(s) through the joystick control   [] Safety Reset Switches [] Used to change modes and stop the wheelchair when driving in latch mode    [] Upgraded Electronics   [] programming for accurate control [] progressive Disease/changing condition [] non-proportional drive control needed [] Needed in order to operate power seat functions through joystick control   [] Display box [] Allows user to see in which mode and drive the wheelchair is set  [] necessary for alternate controls    [] Digital interface electronics [] Allows w/c to operate when using alternative drive controls  [] ASL Head Array [] Allows client to operate wheelchair  through switches placed in tri-panel headrest  [] Sip and puff with tubing kit [] needed to operate sip and puff drive controls  [] Upgraded tracking electronics [] increase safety when driving [] correct tracking when on uneven surfaces  [x] Mount for switches or joystick [] Attaches switches to w/c  [x] Swing away for access  or transfers [] midline for optimal placement [] provides for consistent access   [] Attendant controlled joystick plus mount [] safety [] long distance driving [] operation of seat functions [] compliance with transportation regulations []      Rear wheel placement/Axle adjustability [] None [] semi adjustable [] fully adjustable  [] improved UE access to wheels [] improved stability [] changing angle in space for improvement of postural stability [] 1-arm drive access [] amputee pad placement []    Wheel rims/ hand rims  [] metal  [] plastic coated [] oblique projections [] vertical projections [] Provide ability to propel manual wheelchair  []  Increase self-propulsion with hand weakness/decreased grasp  Push handles [] extended  [] angle adjustable  [] standard [] caregiver access [] caregiver assist [] allows "hooking" to enable increased ability to perform ADLs or maintain balance  One armed device  [] Lt   [] Rt [] enable propulsion of manual wheelchair with one arm   []      Brake/wheel lock extension []  Lt   []  Rt [] increase indep in applying wheel locks   [] Side guards [] prevent clothing getting caught in wheel or becoming soiled []  prevent skin tears/abrasions  Battery: U1 x 2 [x] to power wheelchair   Other:     The above equipment has a life- long use expectancy. Growth and changes in medical and/or functional conditions would be the exceptions. This is to certify that the therapist has no financial relationship with durable medical provider or manufacturer. The therapist will not receive remuneration of any kind for the equipment recommended in this evaluation.   Patient has mobility limitation that significantly impairs safe, timely participation in one or more mobility related ADL's.  (bathing, toileting, feeding, dressing, grooming, moving from room to room)                                                             [x]  Yes []  No Will mobility device sufficiently improve ability to participate and/or be aided in participation of MRADL's?         [x]  Yes []  No Can limitation be  compensated for with use of a cane or walker?                                                                                []  Yes [x]  No Does patient or caregiver demonstrate ability/potential ability & willingness to safely use the mobility device?   [x]  Yes []  No Does patient's home environment support use of recommended mobility device?                                                    [x]  Yes []  No Does patient have sufficient upper extremity function necessary to functionally propel a manual wheelchair?    []  Yes [x]  No Does patient have sufficient strength and trunk stability to safely operate a POV (scooter)?                                  []   Yes [x]  No Does patient need additional features/benefits provided by a power wheelchair for MRADL's in the home?       [x]  Yes []  No Does the patient demonstrate the ability to safely use a power wheelchair?                                                              [x]  Yes []  No  Therapist Name Printed: Guido Sander, PT Date: 11-23-21  Therapist's Signature:   Date:   Supplier's Name Printed: [employer] Date: 11-23-21  Supplier's Signature:   Date:  Patient/Caregiver Signature:   Date:     This is to certify that I have read this evaluation and do agree with the content within:   Physician's Name Printed: Gilles Chiquito, MD:  Luisa Hart, MD  58 Signature:  Date:     This is to certify that I, the above signed therapist have the following affiliations: []  This DME provider []  Manufacturer of recommended equipment []  Patient's long term care facility [x]  None of the above            PT Education - 11/23/21 1623     Education Details eval results with recommended power wheelchair - Group 2    Person(s) Educated Patient    Methods Explanation    Comprehension Verbalized understanding                         Plan - 11/23/21 1625     Clinical Impression Statement Pt seen for power  wheelchair evaluation with Josh Cadle, ATP with [place] present for eval; recommend group 2 power wheelchair with captain's seat;  Pt is not a functional ambulator at current time.  Pt requires a power wheelchair to increase independence and safety with ADL's and mobility in her home environment.    Personal Factors and Comorbidities Behavior Pattern;Comorbidity 1;Time since onset of injury/illness/exacerbation    Comorbidities Lt Transfemoral amputation    Examination-Activity Limitations Bend;Squat;Stairs;Locomotion Level;Stand;Lift    Examination-Participation Restrictions Cleaning;Driving;Laundry;Yard Work;Shop;Meal Prep    Stability/Clinical Decision Making Stable/Uncomplicated    Clinical Decision Making Low    PT Frequency One time visit    PT Treatment/Interventions Other (comment)   evaluation only (wheelchair)   PT Next Visit Plan N/A    Consulted and Agree with Plan of Care Patient             Patient will benefit from skilled therapeutic intervention in order to improve the following deficits and impairments:  Difficulty walking, Decreased balance, Decreased mobility, Decreased strength  Visit Diagnosis: Other abnormalities of gait and mobility - Plan: PT plan of care cert/re-cert  Muscle weakness (generalized) - Plan: PT plan of care cert/re-cert     Problem List Patient Active Problem List   Diagnosis Date Noted   LLQ abdominal pain 11/21/2021   Encounter for wheelchair assessment 08/23/2021   Proteinuria 04/04/2021   Electrolyte abnormality 01/25/2021   Hypokalemia 01/19/2021   Hypomagnesemia 01/19/2021   Polysubstance abuse ([place]) 01/19/2021   Chronic kidney disease 01/19/2021   Prolonged QT interval 01/19/2021   Hypocalcemia 01/19/2021   Stress incontinence in female 01/11/2021   GI bleed 07/07/2020   Spleen hematoma 11/02/2019   Microscopic hematuria 07/17/2019   Sleep disorder  02/04/2019   Kidney disease 02/04/2019   Generalized anxiety disorder  07/24/2017   Tobacco use disorder 07/24/2017   Atherosclerosis of abdominal aorta ([place]) 10/25/2016   Gout involving toe of right foot 02/28/2016   History of hepatitis C 03/15/2015   Substance use disorder 03/05/2015   Iron deficiency anemia due to chronic blood loss 02/23/2015   Vitamin D deficiency 12/02/2014   Preventative health care 11/30/2014   Depression 07/10/2013   Type 2 diabetes mellitus with diabetic neuropathy ([place]) 06/07/2013   Above knee amputation of left lower extremity ([place]) 08/18/2012   Angiodysplasia of duodenum 08/03/2011   Hyperlipidemia 08/03/2011   Essential hypertension, benign 07/22/2008   GERD 07/22/2008    Alda Lea, PT 11/23/2021, 4:37 PM  [facility] [address] [place], [place], [address] Phone: [phone]   Fax:  [phone]  Name: LENZI MARMO MRN: [id number] Date of Birth: [birth year]

## 2021-11-24 ENCOUNTER — Inpatient Hospital Stay: Payer: 59

## 2021-11-24 ENCOUNTER — Other Ambulatory Visit: Payer: Self-pay

## 2021-11-24 DIAGNOSIS — Z5111 Encounter for antineoplastic chemotherapy: Secondary | ICD-10-CM | POA: Diagnosis not present

## 2021-11-24 DIAGNOSIS — D500 Iron deficiency anemia secondary to blood loss (chronic): Secondary | ICD-10-CM

## 2021-11-24 DIAGNOSIS — C19 Malignant neoplasm of rectosigmoid junction: Secondary | ICD-10-CM

## 2021-11-24 LAB — IRON AND IRON BINDING CAPACITY (CC-WL,HP ONLY)
Iron: 37 ug/dL (ref 28–170)
Saturation Ratios: 13 % (ref 10.4–31.8)
TIBC: 295 ug/dL (ref 250–450)
UIBC: 258 ug/dL (ref 148–442)

## 2021-11-24 LAB — CBC WITH DIFFERENTIAL (CANCER CENTER ONLY)
Abs Immature Granulocytes: 0.03 10*3/uL (ref 0.00–0.07)
Basophils Absolute: 0 10*3/uL (ref 0.0–0.1)
Basophils Relative: 1 %
Eosinophils Absolute: 0.1 10*3/uL (ref 0.0–0.5)
Eosinophils Relative: 2 %
HCT: 34.6 % — ABNORMAL LOW (ref 36.0–46.0)
Hemoglobin: 11.4 g/dL — ABNORMAL LOW (ref 12.0–15.0)
Immature Granulocytes: 1 %
Lymphocytes Relative: 15 %
Lymphs Abs: 0.9 10*3/uL (ref 0.7–4.0)
MCH: 32.1 pg (ref 26.0–34.0)
MCHC: 32.9 g/dL (ref 30.0–36.0)
MCV: 97.5 fL (ref 80.0–100.0)
Monocytes Absolute: 0.6 10*3/uL (ref 0.1–1.0)
Monocytes Relative: 11 %
Neutro Abs: 4.1 10*3/uL (ref 1.7–7.7)
Neutrophils Relative %: 70 %
Platelet Count: 260 10*3/uL (ref 150–400)
RBC: 3.55 MIL/uL — ABNORMAL LOW (ref 3.87–5.11)
RDW: 12.4 % (ref 11.5–15.5)
WBC Count: 5.8 10*3/uL (ref 4.0–10.5)
nRBC: 0 % (ref 0.0–0.2)

## 2021-11-24 LAB — FERRITIN: Ferritin: 76 ng/mL (ref 11–307)

## 2021-11-24 MED ORDER — SUCRALFATE 1 GM/10ML PO SUSP
1.0000 g | Freq: Four times a day (QID) | ORAL | 2 refills | Status: DC
  Filled 2021-11-24: qty 414, 11d supply, fill #0

## 2021-11-24 NOTE — Telephone Encounter (Signed)
I have discontinued patient's Flagyl and Bactrim as patient CT scan does not show any evidence of acute diverticulitis. Sending prescription for Carafate to [facility] to help with symptomatic relief.

## 2021-11-24 NOTE — Telephone Encounter (Signed)
Patient calling back for results. Patient was instructed to stop Flagyl and Bactrim, restart home Omeprazole. She would like the Rx for Carafate sent to [facility].

## 2021-11-24 NOTE — Addendum Note (Signed)
Addended byLinwood Dibbles on: 11/24/2021 06:15 PM   Modules accepted: Orders

## 2021-11-25 ENCOUNTER — Other Ambulatory Visit (HOSPITAL_COMMUNITY): Payer: Self-pay

## 2021-11-27 ENCOUNTER — Other Ambulatory Visit (HOSPITAL_COMMUNITY): Payer: Self-pay

## 2021-11-29 ENCOUNTER — Other Ambulatory Visit (HOSPITAL_COMMUNITY): Payer: Self-pay

## 2021-11-29 ENCOUNTER — Other Ambulatory Visit: Payer: Self-pay | Admitting: *Deleted

## 2021-11-29 ENCOUNTER — Telehealth: Payer: Self-pay | Admitting: *Deleted

## 2021-11-29 DIAGNOSIS — N179 Acute kidney failure, unspecified: Secondary | ICD-10-CM

## 2021-11-29 NOTE — Telephone Encounter (Signed)
Call from Manuela Schwartz, [place], who stated pt had called and stated she's out of Gabapentin and needs a refill. Manuela Schwartz stated she's 4 days early for a refill and they treat Gabapentin as a control substance. They cannot refill early unless the doctor gives the ok or change the rx. Pt told Manuela Schwartz she had been taking more than prescribed lately. And Manuela Schwartz stated pt was agitated when she called.

## 2021-11-30 ENCOUNTER — Other Ambulatory Visit (HOSPITAL_COMMUNITY): Payer: Self-pay

## 2021-11-30 NOTE — Telephone Encounter (Signed)
Notified Alicia at [facility] ok to refill early. She will get this ready for patient now.

## 2021-12-01 ENCOUNTER — Other Ambulatory Visit (HOSPITAL_COMMUNITY): Payer: Self-pay

## 2021-12-05 ENCOUNTER — Encounter: Payer: 59 | Admitting: Internal Medicine

## 2021-12-07 ENCOUNTER — Encounter: Payer: 59 | Admitting: Internal Medicine

## 2021-12-07 ENCOUNTER — Encounter: Payer: Self-pay | Admitting: Internal Medicine

## 2021-12-07 NOTE — Progress Notes (Deleted)
71 year old female with past medical history of AKA of left lower extremity, hypertension, type 2 diabetes complicated by neuropathy,   Type 2 diabetes complicated by neuropathy Patient's neuropathic pain at the left residual limb has worsened.  She has been taking gabapentin ***. Last hemoglobin A1c was in November 2022 and was 5 at that time. Medications include metformin 1000 mg twice daily, Trulicity 8.42 mg weekly  Proteinuria Patient has history of nephrotic range proteinuria.  She was seen by nephrology Dr. Johnney Ou.  With her family history of Essa's GS they think nephrotic syndrome likely from this rather than diabetic nephropathy.  She was instructed to start SGLT2 inhibitor.  Left lower quadrant pain CT was negative for acute diverticulitis.  There was possible duodenal thickening consistent with duodenitis.  Biotics were stopped and patient instructed to restart omeprazole and Carafate.  Depression PHQ-9

## 2021-12-07 NOTE — Progress Notes (Signed)
Internal Medicine Clinic Attending  Case discussed with Dr. Basaraba at the time of the visit.  We reviewed the resident's history and exam and pertinent patient test results.  I agree with the assessment, diagnosis, and plan of care documented in the resident's note.    

## 2021-12-08 ENCOUNTER — Inpatient Hospital Stay: Payer: 59 | Attending: Hematology

## 2021-12-15 ENCOUNTER — Other Ambulatory Visit (HOSPITAL_COMMUNITY): Payer: Self-pay

## 2021-12-22 ENCOUNTER — Other Ambulatory Visit (HOSPITAL_COMMUNITY): Payer: Self-pay

## 2021-12-25 ENCOUNTER — Other Ambulatory Visit (HOSPITAL_COMMUNITY): Payer: Self-pay

## 2022-01-03 ENCOUNTER — Other Ambulatory Visit (HOSPITAL_COMMUNITY): Payer: Self-pay

## 2022-01-05 ENCOUNTER — Inpatient Hospital Stay: Payer: 59

## 2022-01-05 ENCOUNTER — Other Ambulatory Visit: Payer: Self-pay

## 2022-01-05 ENCOUNTER — Inpatient Hospital Stay: Payer: 59 | Attending: Hematology

## 2022-01-05 DIAGNOSIS — D500 Iron deficiency anemia secondary to blood loss (chronic): Secondary | ICD-10-CM | POA: Diagnosis present

## 2022-01-05 DIAGNOSIS — C19 Malignant neoplasm of rectosigmoid junction: Secondary | ICD-10-CM

## 2022-01-05 LAB — IRON AND IRON BINDING CAPACITY (CC-WL,HP ONLY)
Iron: 62 ug/dL (ref 28–170)
Saturation Ratios: 25 % (ref 10.4–31.8)
TIBC: 252 ug/dL (ref 250–450)
UIBC: 190 ug/dL (ref 148–442)

## 2022-01-05 LAB — CBC WITH DIFFERENTIAL (CANCER CENTER ONLY)
Abs Immature Granulocytes: 0.04 10*3/uL (ref 0.00–0.07)
Basophils Absolute: 0 10*3/uL (ref 0.0–0.1)
Basophils Relative: 1 %
Eosinophils Absolute: 0.1 10*3/uL (ref 0.0–0.5)
Eosinophils Relative: 1 %
HCT: 35.5 % — ABNORMAL LOW (ref 36.0–46.0)
Hemoglobin: 12.2 g/dL (ref 12.0–15.0)
Immature Granulocytes: 1 %
Lymphocytes Relative: 15 %
Lymphs Abs: 1.4 10*3/uL (ref 0.7–4.0)
MCH: 31.3 pg (ref 26.0–34.0)
MCHC: 34.4 g/dL (ref 30.0–36.0)
MCV: 91 fL (ref 80.0–100.0)
Monocytes Absolute: 1.1 10*3/uL — ABNORMAL HIGH (ref 0.1–1.0)
Monocytes Relative: 12 %
Neutro Abs: 6.2 10*3/uL (ref 1.7–7.7)
Neutrophils Relative %: 70 %
Platelet Count: 284 10*3/uL (ref 150–400)
RBC: 3.9 MIL/uL (ref 3.87–5.11)
RDW: 12.8 % (ref 11.5–15.5)
WBC Count: 8.8 10*3/uL (ref 4.0–10.5)
nRBC: 0 % (ref 0.0–0.2)

## 2022-01-05 LAB — FERRITIN: Ferritin: 80 ng/mL (ref 11–307)

## 2022-01-22 ENCOUNTER — Encounter: Payer: Self-pay | Admitting: *Deleted

## 2022-01-22 ENCOUNTER — Other Ambulatory Visit: Payer: Self-pay | Admitting: Internal Medicine

## 2022-01-22 ENCOUNTER — Other Ambulatory Visit (HOSPITAL_COMMUNITY): Payer: Self-pay

## 2022-01-24 ENCOUNTER — Other Ambulatory Visit (HOSPITAL_COMMUNITY): Payer: Self-pay

## 2022-01-24 MED ORDER — FAMOTIDINE 20 MG PO TABS
20.0000 mg | ORAL_TABLET | Freq: Two times a day (BID) | ORAL | 1 refills | Status: DC
Start: 2022-01-24 — End: 2022-04-13
  Filled 2022-01-24: qty 60, 30d supply, fill #0
  Filled 2022-03-07 (×2): qty 60, 30d supply, fill #1

## 2022-02-01 ENCOUNTER — Other Ambulatory Visit: Payer: Self-pay | Admitting: Hematology

## 2022-02-01 ENCOUNTER — Other Ambulatory Visit (HOSPITAL_COMMUNITY): Payer: Self-pay

## 2022-02-01 DIAGNOSIS — D500 Iron deficiency anemia secondary to blood loss (chronic): Secondary | ICD-10-CM

## 2022-02-01 DIAGNOSIS — K922 Gastrointestinal hemorrhage, unspecified: Secondary | ICD-10-CM

## 2022-02-02 ENCOUNTER — Inpatient Hospital Stay: Payer: 59

## 2022-02-02 ENCOUNTER — Inpatient Hospital Stay: Payer: 59 | Admitting: Hematology

## 2022-02-02 ENCOUNTER — Inpatient Hospital Stay: Payer: 59 | Attending: Hematology

## 2022-02-06 ENCOUNTER — Other Ambulatory Visit (HOSPITAL_COMMUNITY): Payer: Self-pay

## 2022-02-20 ENCOUNTER — Other Ambulatory Visit (HOSPITAL_COMMUNITY): Payer: Self-pay

## 2022-02-23 ENCOUNTER — Other Ambulatory Visit (HOSPITAL_COMMUNITY): Payer: Self-pay

## 2022-02-23 ENCOUNTER — Other Ambulatory Visit: Payer: Self-pay | Admitting: *Deleted

## 2022-02-23 ENCOUNTER — Other Ambulatory Visit: Payer: Self-pay

## 2022-02-23 MED ORDER — OMEPRAZOLE 20 MG PO CPDR
20.0000 mg | DELAYED_RELEASE_CAPSULE | Freq: Every day | ORAL | 2 refills | Status: DC
  Filled 2022-02-23: qty 30, 30d supply, fill #0
  Filled 2022-03-27: qty 30, 30d supply, fill #1
  Filled 2022-05-01: qty 30, 30d supply, fill #2

## 2022-02-23 NOTE — Telephone Encounter (Signed)
I refilled her omeprazole.

## 2022-02-26 ENCOUNTER — Other Ambulatory Visit (HOSPITAL_COMMUNITY): Payer: Self-pay

## 2022-02-27 ENCOUNTER — Other Ambulatory Visit (HOSPITAL_COMMUNITY): Payer: Self-pay

## 2022-03-02 ENCOUNTER — Other Ambulatory Visit (HOSPITAL_COMMUNITY): Payer: Self-pay

## 2022-03-06 ENCOUNTER — Other Ambulatory Visit: Payer: Self-pay

## 2022-03-06 ENCOUNTER — Inpatient Hospital Stay (HOSPITAL_BASED_OUTPATIENT_CLINIC_OR_DEPARTMENT_OTHER): Payer: 59 | Admitting: Hematology

## 2022-03-06 ENCOUNTER — Inpatient Hospital Stay: Payer: 59

## 2022-03-06 ENCOUNTER — Inpatient Hospital Stay: Payer: 59 | Attending: Hematology

## 2022-03-06 ENCOUNTER — Encounter: Payer: Self-pay | Admitting: Hematology

## 2022-03-06 VITALS — BP 150/81 | HR 71 | Resp 18

## 2022-03-06 VITALS — BP 133/82 | HR 75 | Temp 98.0°F | Resp 18 | Ht 63.0 in | Wt 150.8 lb

## 2022-03-06 DIAGNOSIS — D500 Iron deficiency anemia secondary to blood loss (chronic): Secondary | ICD-10-CM

## 2022-03-06 DIAGNOSIS — N189 Chronic kidney disease, unspecified: Secondary | ICD-10-CM | POA: Insufficient documentation

## 2022-03-06 DIAGNOSIS — B182 Chronic viral hepatitis C: Secondary | ICD-10-CM | POA: Insufficient documentation

## 2022-03-06 DIAGNOSIS — K219 Gastro-esophageal reflux disease without esophagitis: Secondary | ICD-10-CM | POA: Insufficient documentation

## 2022-03-06 DIAGNOSIS — Z794 Long term (current) use of insulin: Secondary | ICD-10-CM | POA: Insufficient documentation

## 2022-03-06 DIAGNOSIS — I129 Hypertensive chronic kidney disease with stage 1 through stage 4 chronic kidney disease, or unspecified chronic kidney disease: Secondary | ICD-10-CM | POA: Diagnosis not present

## 2022-03-06 DIAGNOSIS — C19 Malignant neoplasm of rectosigmoid junction: Secondary | ICD-10-CM

## 2022-03-06 DIAGNOSIS — Z5112 Encounter for antineoplastic immunotherapy: Secondary | ICD-10-CM | POA: Diagnosis not present

## 2022-03-06 DIAGNOSIS — K922 Gastrointestinal hemorrhage, unspecified: Secondary | ICD-10-CM

## 2022-03-06 DIAGNOSIS — Z8619 Personal history of other infectious and parasitic diseases: Secondary | ICD-10-CM

## 2022-03-06 DIAGNOSIS — E1122 Type 2 diabetes mellitus with diabetic chronic kidney disease: Secondary | ICD-10-CM | POA: Diagnosis not present

## 2022-03-06 DIAGNOSIS — G4700 Insomnia, unspecified: Secondary | ICD-10-CM | POA: Insufficient documentation

## 2022-03-06 DIAGNOSIS — Z89612 Acquired absence of left leg above knee: Secondary | ICD-10-CM | POA: Diagnosis not present

## 2022-03-06 DIAGNOSIS — Z7984 Long term (current) use of oral hypoglycemic drugs: Secondary | ICD-10-CM | POA: Insufficient documentation

## 2022-03-06 DIAGNOSIS — E1136 Type 2 diabetes mellitus with diabetic cataract: Secondary | ICD-10-CM | POA: Insufficient documentation

## 2022-03-06 DIAGNOSIS — Z79899 Other long term (current) drug therapy: Secondary | ICD-10-CM | POA: Insufficient documentation

## 2022-03-06 DIAGNOSIS — D649 Anemia, unspecified: Secondary | ICD-10-CM

## 2022-03-06 DIAGNOSIS — E1151 Type 2 diabetes mellitus with diabetic peripheral angiopathy without gangrene: Secondary | ICD-10-CM | POA: Diagnosis not present

## 2022-03-06 LAB — CBC WITH DIFFERENTIAL (CANCER CENTER ONLY)
Abs Immature Granulocytes: 0.06 10*3/uL (ref 0.00–0.07)
Basophils Absolute: 0 10*3/uL (ref 0.0–0.1)
Basophils Relative: 1 %
Eosinophils Absolute: 0.1 10*3/uL (ref 0.0–0.5)
Eosinophils Relative: 3 %
HCT: 23.7 % — ABNORMAL LOW (ref 36.0–46.0)
Hemoglobin: 7.4 g/dL — ABNORMAL LOW (ref 12.0–15.0)
Immature Granulocytes: 1 %
Lymphocytes Relative: 29 %
Lymphs Abs: 1.4 10*3/uL (ref 0.7–4.0)
MCH: 29.7 pg (ref 26.0–34.0)
MCHC: 31.2 g/dL (ref 30.0–36.0)
MCV: 95.2 fL (ref 80.0–100.0)
Monocytes Absolute: 0.5 10*3/uL (ref 0.1–1.0)
Monocytes Relative: 10 %
Neutro Abs: 2.8 10*3/uL (ref 1.7–7.7)
Neutrophils Relative %: 56 %
Platelet Count: 375 10*3/uL (ref 150–400)
RBC: 2.49 MIL/uL — ABNORMAL LOW (ref 3.87–5.11)
RDW: 15.9 % — ABNORMAL HIGH (ref 11.5–15.5)
WBC Count: 5 10*3/uL (ref 4.0–10.5)
nRBC: 0 % (ref 0.0–0.2)

## 2022-03-06 LAB — SAMPLE TO BLOOD BANK

## 2022-03-06 LAB — IRON AND IRON BINDING CAPACITY (CC-WL,HP ONLY)
Iron: 11 ug/dL — ABNORMAL LOW (ref 28–170)
Saturation Ratios: 3 % — ABNORMAL LOW (ref 10.4–31.8)
TIBC: 347 ug/dL (ref 250–450)
UIBC: 336 ug/dL (ref 148–442)

## 2022-03-06 LAB — FERRITIN: Ferritin: 8 ng/mL — ABNORMAL LOW (ref 11–307)

## 2022-03-06 LAB — PREPARE RBC (CROSSMATCH)

## 2022-03-06 LAB — TOTAL PROTEIN, URINE DIPSTICK: Protein, ur: 300 mg/dL — AB

## 2022-03-06 MED ORDER — SODIUM CHLORIDE 0.9 % IV SOLN: Freq: Once | INTRAVENOUS | Status: AC

## 2022-03-06 MED ORDER — SODIUM CHLORIDE 0.9 % IV SOLN
5.0000 mg/kg | Freq: Once | INTRAVENOUS | Status: AC
  Administered 2022-03-06: 350 mg via INTRAVENOUS
  Filled 2022-03-06: qty 14

## 2022-03-06 NOTE — Progress Notes (Signed)
[facility]   [phone] [phone]   Clinic Follow up Note   Patient Care Team: Linus Galas, MD as PCP - Evalina Field, MD as Consulting Physician (Ophthalmology) Coralie Keens, MD as Consulting Physician (General Surgery) Truitt Merle, MD as Consulting Physician (Hematology) Comer, Okey Regal, MD as Consulting Physician (Infectious Diseases) Loletha Carrow Kirke Corin, MD as Consulting Physician (Gastroenterology)  Date of Service:  03/06/2022  CHIEF COMPLAINT: f/u of anemia  CURRENT THERAPY:  -Bevacizumab, intermittently given proteinuria  ASSESSMENT & PLAN:  [name] is a 71 y.o. female with   1. Iron deficiency anemia secondary to GI blood loss, and Anemia of Chronic Disease -longstanding history of intermittent anemia dating back at least 2008 when Hg was 10. -h/o multiple GI bleeding in 2016, required blood transfusion, hospitalization and IV Feraheme -EGD and colonoscopy on 10/29/17 and 03/17/18 with Dr. Loletha Carrow, which were normal. 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 -Due to recurrent anemia requiring IV Feraheme and blood transfusions, she started bevacizumab for GI bleeding on 08/10/20. She responded very well with resolved anemia and less need for iv iron. This has been held intermittently due to elevated urine protein. Last dose given 11/10/21. -labs reviewed, hgb down to 7.4 today. Urine protein is 300. Will schedule beva and blood transfusion to be done today or in next few days  -We will continue to monitor.   2. Comorbidities: DM2, insomnia, GERD, chronic Hep C infection, cocaine use -Management per PCP's office.  -GERD uncontrolled on Tums and omeprazole 20 mg once daily -positive for hep C in 2009, received treatment in 2016. 05/2018 liver US unremarkable -h/o cocaine use. She reports continued intermittent use, more lately with her sister's passing. I again advised her to discontinue  use due to effects on her health. She reports difficulty stopping and asks about intervention options. I encouraged her to discuss with her PCP tomorrow.   3. Left AKA, has prosthesis   4. CKD and proteinuria -Her urine protein was extremely high (>209m/dl) on 10/27/21 but returned to 300 two weeks later. -Probably related to her diabetes, and substance abuse.  I again encouraged her to stop cocaine use. -She will follow-up with her nephrologist    PLAN: -return to lab for type/screen -1U blood transfusion in next few days -We will proceed with bevacizumab today, and again in 4 weeks -Lab every 2 weeks, follow-up in 4 weeks   No problem-specific Assessment & Plan notes found for this encounter.   INTERVAL HISTORY:  Patricia LAUBSCHERis here for a follow up of anemia. She was last seen by me on 11/10/21. She presents to the clinic alone. She tells me she has been dealing with the loss of her sister and some controversy around her death.   All other systems were reviewed with the patient and are negative.  MEDICAL HISTORY:  Past Medical History:  Diagnosis Date   Allergic rhinitis    Anemia, iron deficiency 05/03/2011   2/2 GI AVMs - recieves iron infusions as well as periodic red blood cell transfusions   Anxiety    Arteriovenous malformation of duodenum 02/25/2015   CARPAL TUNNEL RELEASE, HX OF 07/22/2008   Chest pain 10/28/2019   Colon polyps    Diabetes mellitus without complication ([place]    type 2   Difficulty sleeping    takes trazadone for sleep   Diverticulosis    Electrolyte abnormality 01/25/2021   Fall 01/03/2020  GERD (gastroesophageal reflux disease)    GI bleed 07/07/2020   Hepatitis C    in SVR with [facility] 12/2014-02/2015.    History of hernia repair 08/18/2012   History of porphyria 11/30/2014   Porphyria cutanea tarda in setting of chronic Hepatitis C, frequent  IV iron infusions, and alcohol abuse    Hypertension    Hypocalcemia 10/28/2019   Hypocalcemia  01/19/2021   Hypokalemia 09/27/2014   Hypokalemia 01/19/2021   Hypomagnesemia 01/19/2021   Peripheral vascular disease ([place])    Prolonged QT interval 01/19/2021   Stroke ([place]) 2010   no residual deficits   Substance use disorder    cocaine   Tachycardia 07/10/2017   Trichomonas vaginitis 04/16/2019    SURGICAL HISTORY: Past Surgical History:  Procedure Laterality Date   ABDOMINAL HYSTERECTOMY     APPLICATION OF WOUND VAC N/A 06/21/2014   Procedure: APPLICATION OF WOUND VAC;  Surgeon: Coralie Keens, MD;  Location: [facility];  Service: General;  Laterality: N/A;   CARPAL TUNNEL RELEASE     rt hand   COLONOSCOPY WITH PROPOFOL N/A 03/17/2018   Procedure: COLONOSCOPY WITH PROPOFOL;  Surgeon: Doran Stabler, MD;  Location: [facility];  Service: Gastroenterology;  Laterality: N/A;   ENTEROSCOPY N/A 12/04/2012   Procedure: ENTEROSCOPY;  Surgeon: Beryle Beams, MD;  Location: [facility];  Service: Endoscopy;  Laterality: N/A;   ENTEROSCOPY N/A 12/18/2012   Procedure: ENTEROSCOPY;  Surgeon: Beryle Beams, MD;  [facility]: [facility];  Service: Endoscopy;  Laterality: N/A;   ENTEROSCOPY N/A 02/25/2015   Procedure: ENTEROSCOPY;  Surgeon: Inda Castle, MD;  [facility];  Service: Endoscopy;  Laterality: N/A;   ENTEROSCOPY N/A 10/29/2017   Procedure: ENTEROSCOPY;  Surgeon: Doran Stabler, MD;  [facility];  Service: Gastroenterology;  Laterality: N/A;   ENTEROSCOPY N/A 07/08/2020   Procedure: ENTEROSCOPY;  Surgeon: Milus Banister, MD;  [facility];  Service: Endoscopy;  Laterality: N/A;   ESOPHAGOGASTRODUODENOSCOPY  05/05/2011   Procedure: ESOPHAGOGASTRODUODENOSCOPY (EGD);  Surgeon: Zenovia Jarred, MD;  Location: [facility];  Service: Gastroenterology;  Laterality: N/A;  Dr. Hilarie Fredrickson will do procedure for Dr. Benson Norway Saturday.   ESOPHAGOGASTRODUODENOSCOPY  05/08/2011   Procedure: ESOPHAGOGASTRODUODENOSCOPY (EGD);  Surgeon: Beryle Beams;  [facility];   Service: Endoscopy;  Laterality: N/A;   ESOPHAGOGASTRODUODENOSCOPY  06/07/2011   Procedure: ESOPHAGOGASTRODUODENOSCOPY (EGD);  Surgeon: Beryle Beams, MD;  Location: [facility];  Service: Endoscopy;  Laterality: N/A;   ESOPHAGOGASTRODUODENOSCOPY  12/20/2011   Procedure: ESOPHAGOGASTRODUODENOSCOPY (EGD);  Surgeon: Beryle Beams, MD;  Location: [facility];  Service: Endoscopy;  Laterality: N/A;   EYE SURGERY Bilateral    cataracts   FLEXIBLE SIGMOIDOSCOPY  12/21/2011   Procedure: FLEXIBLE SIGMOIDOSCOPY;  Surgeon: Beryle Beams, MD;  [facility];  Service: Endoscopy;  Laterality: N/A;   HOT HEMOSTASIS  06/07/2011   Procedure: HOT HEMOSTASIS (ARGON PLASMA COAGULATION/BICAP);  Surgeon: Beryle Beams, MD;  Location: [facility];  Service: Endoscopy;  Laterality: N/A;   HOT HEMOSTASIS N/A 07/08/2020   Procedure: HOT HEMOSTASIS (ARGON PLASMA COAGULATION/BICAP);  Surgeon: Milus Banister, MD;  Location: [facility];  Service: Endoscopy;  Laterality: N/A;   INCISIONAL HERNIA REPAIR N/A 06/01/2014   Procedure: ATTEMPTED LAPAROSCOPIC AND OPEN INCISIONAL HERNIA REPAIR WITH MESH;  Surgeon: Coralie Keens, MD;  Location: [facility];  Service: General;  Laterality: N/A;   INSERTION OF MESH N/A 06/01/2014   Procedure: INSERTION OF MESH;  Surgeon: Coralie Keens, MD;  Location:  [facility];  Service: General;  Laterality: N/A;   LAPAROTOMY  02/16/2012   Procedure: EXPLORATORY LAPAROTOMY;  Surgeon: Edward Jolly, MD;  [facility];  Service: General;  Laterality: N/A;  oversewing of anastomotic leak and rigid sigmoidoscopy   LAPAROTOMY N/A 06/21/2014   Procedure: ABDOMINAL WOUND EXPLORATION;  Surgeon: Coralie Keens, MD;  Location: [facility];  Service: General;  Laterality: N/A;   LEG AMPUTATION ABOVE KNEE     left   PARTIAL COLECTOMY  02/15/2012   Procedure: PARTIAL COLECTOMY;  Surgeon: Harl Bowie, MD;  [facility]: [facility];  Service: General;  Laterality: N/A;   SUBMUCOSAL INJECTION  07/08/2020    Procedure: SUBMUCOSAL INJECTION;  Surgeon: Milus Banister, MD;  Location: [facility];  Service: Endoscopy;;   TONSILLECTOMY     WRIST FUSION Right 04/27/2021   Procedure: SCAPHOID EXCISION  WITH 4 BONE FUSION RIGHT WRIST;  Surgeon: Leanora Cover, MD;  Location: [facility];  Service: Orthopedics;  Laterality: Right;    I have reviewed the social history and family history with the patient and they are unchanged from previous note.  ALLERGIES:  is allergic to ciprofloxacin, morphine and related, penicillins, codeine, lisinopril, and feraheme [ferumoxytol].  MEDICATIONS:  Current Outpatient Medications  Medication Sig Dispense Refill   ACCU-CHEK FASTCLIX LANCETS MISC Use to test blood glucose 3 times daily. 100 each 11   amLODipine (NORVASC) 10 MG tablet Take 1 tablet (10 mg total) by mouth daily. 90 tablet 1   Blood Glucose Monitoring Suppl (ACCU-CHEK GUIDE) w/Device KIT 1 each by Does not apply route 3 (three) times daily. The patient is insulin requiring, ICD 10 code E11.65. The patient tests 3 times a day. 1 kit 1   calcium-vitamin D (OSCAL WITH D) 500-200 MG-UNIT tablet Take 1 tablet by mouth 2 (two) times daily. 60 tablet 0   carvedilol (COREG) 3.125 MG tablet Take 1 tablet (3.125 mg total) by mouth 2 (two) times daily. 60 tablet 2   colchicine 0.6 MG tablet Take 0.6 mg by mouth daily as needed.     Dulaglutide (TRULICITY) 1.61 WR/6.0AV SOPN INJECT 0.75 MG INTO THE SKIN ONCE A WEEK. 6 mL 3   Dulaglutide 0.75 MG/0.5ML SOPN Inject 0.75 mg into the skin once a week. Sunday 6 mL 1   ergocalciferol (DRISDOL) 1.25 MG (50000 UT) capsule Take 1 capsule by mouth weekly for 4 weeks then 1 capsule monthly for 4 months 8 capsule 3   famotidine (PEPCID) 20 MG tablet Take 1 tablet (20 mg total) by mouth 2 (two) times daily. 60 tablet 1   gabapentin (NEURONTIN) 400 MG capsule TAKE 1 CAPSULE BY MOUTH EVERY MORNING AND THEN TAKE 1 CAPSULE IN THE AFTERNOON; THEN 2 CAPSULES BY MOUTH BEFORE  BEDTIME 120 capsule 5   glucose blood (ACCU-CHEK GUIDE) test strip Use as instructed 100 strip 3   losartan (COZAAR) 100 MG tablet Take 1 tablet (100 mg total) by mouth daily. 90 tablet 1   magnesium oxide (MAG-OX) 400 (240 Mg) MG tablet Take 1 tablet (400 mg total) by mouth daily. 30 tablet 5   metFORMIN (GLUCOPHAGE) 1000 MG tablet Take 1 tablet (1,000 mg total) by mouth 2 (two) times daily. 180 tablet 1   omeprazole (PRILOSEC) 20 MG capsule Take 1 capsule (20 mg total) by mouth daily. 30 capsule 2   ondansetron (ZOFRAN) 4 MG tablet Take 1 tablet (4 mg total) by mouth every 8 (eight) hours as needed for nausea or vomiting. 20 tablet  0   rosuvastatin (CRESTOR) 20 MG tablet Take 1 tablet (20 mg total) by mouth daily. 90 tablet 1   sucralfate (CARAFATE) 1 GM/10ML suspension Take 10 mLs (1 g total) by mouth 4 (four) times daily. 414 mL 2   No current facility-administered medications for this visit.    PHYSICAL EXAMINATION: ECOG PERFORMANCE STATUS: 2 - Symptomatic, <50% confined to bed  Vitals:   03/06/22 0914  BP: 133/82  Pulse: 75  Resp: 18  Temp: 98 F (36.7 C)  SpO2: 100%   Wt Readings from Last 3 Encounters:  03/06/22 150 lb 12.8 oz (68.4 kg)  11/10/21 155 lb 12.8 oz (70.7 kg)  10/27/21 154 lb 4 oz (70 kg)     GENERAL:alert, no distress and comfortable SKIN: skin color normal, no rashes or significant lesions EYES: normal, Conjunctiva are pink and non-injected, sclera clear  NEURO: alert & oriented x 3 with fluent speech  LABORATORY DATA:  I have reviewed the data as listed    Latest Ref Rng & Units 03/06/2022    8:39 AM 01/05/2022    8:22 AM 11/24/2021    8:48 AM  CBC  WBC 4.0 - 10.5 K/uL 5.0  8.8  5.8   Hemoglobin 12.0 - 15.0 g/dL 7.4  12.2  11.4   Hematocrit 36.0 - 46.0 % 23.7  35.5  34.6   Platelets 150 - 400 K/uL 375  284  260         Latest Ref Rng & Units 11/21/2021    9:40 AM 05/11/2021    9:02 AM 04/24/2021    9:00 AM  CMP  Glucose 70 - 99 mg/dL 108   106  157   BUN 8 - 23 mg/dL 13  17  32   Creatinine 0.44 - 1.00 mg/dL 1.25  1.11  1.61   Sodium 135 - 145 mmol/L 138  141  138   Potassium 3.5 - 5.1 mmol/L 3.4  4.0  4.4   [place] 98 - 111 mmol/L 105  108  105   CO2 22 - 32 mmol/L _0 Calcium 8.9 - 10.3 mg/dL 9.0  8.8  9.0   Total Protein 6.5 - 8.1 g/dL 6.4  6.6    Total Bilirubin 0.3 - 1.2 mg/dL 0.8  <0.2    Alkaline Phos 38 - 126 U/L 73  55    AST 15 - 41 U/L 19  16    ALT 0 - 44 U/L 17  10        RADIOGRAPHIC STUDIES: I have personally reviewed the radiological images as listed and agreed with the findings in the report. No results found.    Orders Placed This Encounter  Procedures   CBC with Differential ([facility] Only)    Standing Status:   Future    Standing Expiration Date:   04/04/2023   Total Protein, Urine dipstick    Standing Status:   Future    Standing Expiration Date:   04/04/2023   All questions were answered. The patient knows to call the clinic with any problems, questions or concerns. No barriers to learning was detected. The total time spent in the appointment was 30 minutes.     Truitt Merle, MD 03/06/2022   I, Wilburn Mylar, am acting as scribe for Truitt Merle, MD.   I have reviewed the above documentation for accuracy and completeness, and I agree with the above.

## 2022-03-06 NOTE — Patient Instructions (Signed)
[facility]  Discharge Instructions: Thank you for choosing [facility] to provide your oncology and hematology care.   If you have a lab appointment with the Cancer Center, please go directly to the Cancer Center and check in at the registration area.   Wear comfortable clothing and clothing appropriate for easy access to any Portacath or PICC line.   We strive to give you quality time with your provider. You may need to reschedule your appointment if you arrive late (15 or more minutes).  Arriving late affects you and other patients whose appointments are after yours.  Also, if you miss three or more appointments without notifying the office, you may be dismissed from the clinic at the provider's discretion.      For prescription refill requests, have your pharmacy contact our office and allow 72 hours for refills to be completed.    Today you received the following chemotherapy and/or immunotherapy agents: Bevacizumab      To help prevent nausea and vomiting after your treatment, we encourage you to take your nausea medication as directed.  BELOW ARE SYMPTOMS THAT SHOULD BE REPORTED IMMEDIATELY: *FEVER GREATER THAN 100.4 F (38 C) OR HIGHER *CHILLS OR SWEATING *NAUSEA AND VOMITING THAT IS NOT CONTROLLED WITH YOUR NAUSEA MEDICATION *UNUSUAL SHORTNESS OF BREATH *UNUSUAL BRUISING OR BLEEDING *URINARY PROBLEMS (pain or burning when urinating, or frequent urination) *BOWEL PROBLEMS (unusual diarrhea, constipation, pain near the anus) TENDERNESS IN MOUTH AND THROAT WITH OR WITHOUT PRESENCE OF ULCERS (sore throat, sores in mouth, or a toothache) UNUSUAL RASH, SWELLING OR PAIN  UNUSUAL VAGINAL DISCHARGE OR ITCHING   Items with * indicate a potential emergency and should be followed up as soon as possible or go to the Emergency Department if any problems should occur.  Please show the CHEMOTHERAPY ALERT CARD or IMMUNOTHERAPY ALERT CARD at check-in  to the Emergency Department and triage nurse.  Should you have questions after your visit or need to cancel or reschedule your appointment, please contact [facility]  Dept: [phone]  and follow the prompts.  Office hours are 8:00 a.m. to 4:30 p.m. Monday - Friday. Please note that voicemails left after 4:00 p.m. may not be returned until the following business day.  We are closed weekends and major holidays. You have access to a nurse at all times for urgent questions. Please call the main number to the clinic Dept: [phone] and follow the prompts.   For any non-urgent questions, you may also contact your provider using MyChart. We now offer e-Visits for anyone 18 and older to request care online for non-urgent symptoms. For details visit [email or web address].   Also download the MyChart app! Go to the app store, search "MyChart", open the app, select [facility], and log in with your MyChart username and password.  Masks are optional in the cancer centers. If you would like for your care team to wear a mask while they are taking care of you, please let them know. For doctor visits, patients may have with them one support Sanyiah Kanzler who is at least 69 years old. At this time, visitors are not allowed in the infusion area. 

## 2022-03-06 NOTE — Progress Notes (Signed)
Per Dr. Burr Medico, "OK To Treat w/urine protein of 300" today 03/06/2022.

## 2022-03-07 ENCOUNTER — Telehealth: Payer: Self-pay | Admitting: Hematology

## 2022-03-07 ENCOUNTER — Other Ambulatory Visit: Payer: Self-pay

## 2022-03-07 ENCOUNTER — Ambulatory Visit: Payer: 59

## 2022-03-07 ENCOUNTER — Other Ambulatory Visit (HOSPITAL_BASED_OUTPATIENT_CLINIC_OR_DEPARTMENT_OTHER): Payer: Self-pay

## 2022-03-07 ENCOUNTER — Ambulatory Visit (INDEPENDENT_AMBULATORY_CARE_PROVIDER_SITE_OTHER): Payer: 59

## 2022-03-07 ENCOUNTER — Other Ambulatory Visit (HOSPITAL_COMMUNITY): Payer: Self-pay

## 2022-03-07 VITALS — BP 161/93 | HR 71 | Temp 98.0°F | Wt 147.2 lb

## 2022-03-07 DIAGNOSIS — R4589 Other symptoms and signs involving emotional state: Secondary | ICD-10-CM | POA: Diagnosis not present

## 2022-03-07 DIAGNOSIS — F339 Major depressive disorder, recurrent, unspecified: Secondary | ICD-10-CM | POA: Diagnosis not present

## 2022-03-07 DIAGNOSIS — I10 Essential (primary) hypertension: Secondary | ICD-10-CM | POA: Diagnosis not present

## 2022-03-07 DIAGNOSIS — E7849 Other hyperlipidemia: Secondary | ICD-10-CM

## 2022-03-07 DIAGNOSIS — E1140 Type 2 diabetes mellitus with diabetic neuropathy, unspecified: Secondary | ICD-10-CM | POA: Diagnosis not present

## 2022-03-07 DIAGNOSIS — F17210 Nicotine dependence, cigarettes, uncomplicated: Secondary | ICD-10-CM

## 2022-03-07 DIAGNOSIS — Z0000 Encounter for general adult medical examination without abnormal findings: Secondary | ICD-10-CM | POA: Insufficient documentation

## 2022-03-07 DIAGNOSIS — Z23 Encounter for immunization: Secondary | ICD-10-CM | POA: Diagnosis not present

## 2022-03-07 DIAGNOSIS — Z7984 Long term (current) use of oral hypoglycemic drugs: Secondary | ICD-10-CM

## 2022-03-07 LAB — POCT GLYCOSYLATED HEMOGLOBIN (HGB A1C): Hemoglobin A1C: 5.3 % (ref 4.0–5.6)

## 2022-03-07 LAB — GLUCOSE, CAPILLARY: Glucose-Capillary: 89 mg/dL (ref 70–99)

## 2022-03-07 MED ORDER — SPIRONOLACTONE 25 MG PO TABS
25.0000 mg | ORAL_TABLET | Freq: Every day | ORAL | 2 refills | Status: DC
  Filled 2022-03-07: qty 30, 30d supply, fill #0
  Filled 2022-04-13: qty 30, 30d supply, fill #1
  Filled 2022-05-11: qty 30, 30d supply, fill #2

## 2022-03-07 NOTE — Progress Notes (Signed)
CC: HTN  HPI:  [name] is a 71 y.o. female with past medical history of HTN, HLD, T2DM, CKD, GERD, left AKA, and Hep C that presents for a f/u of her HTN.   Current medications include losartan 100 MG, amLODipine 10 MG, and carvedilol 3.125 MG BID. Patient states that she is compliant with these medications. Patient states that she does check her BP regularly at home. Patient reports intermittent HA at home that improves with tylenol. Patient denies lightheadedness, dizziness, CP, or SOB.   Patient has a history of type 2 diabetes. Current medications include metFORMIN 1000 MG BID and Dulaglutide 0.75 MG once weekly. Patient states that she is compliant with this medication. Patient endorses intermittent fatigue but denies polyuria or polydipsia. Patient states that she does visit the ophthalmologist for yearly eye exams.   Patient also has a history of hyperlipidemia. Current medications include rosuvastatin 20 MG. Patient states that she is compliant with these medications.   Patient reports feeling down since the loss of her sister 2 weeks ago. Patient endorses difficulty falling asleep, decreased energy, trouble concentrating, and decreased interest in activities. Patient denies SI or HI. Patient has seen a psychologist in the past and would like to go back to see them. She would not like to see a psychiatrist at the moment.   Patient is requesting the influenza vaccine, pneumonia vaccine, and referral for mammogram today.  Allergies as of 03/07/2022       Reactions   Ciprofloxacin Hives, Swelling   Morphine And Related Other (See Comments)   Makes pt sad and paranoid   Penicillins Hives, Swelling   Has patient had a PCN reaction causing immediate rash, facial/tongue/throat swelling, SOB or lightheadedness with hypotension:YES Has patient had a PCN reaction causing severe rash involving mucus membranes or skin necrosis:UNSURE Has patient had a PCN reaction that required  hospitalization:YES Has patient had a PCN reaction occurring within the last 10 years:No If all of the above answers are "NO", then may proceed with Cephalosporin use.   Codeine Other (See Comments)   sweating per pt (not hives/swelling)   Lisinopril Cough   New onset dry cough after starting lisinopril. Resolved upon switching to losartan.   Feraheme [ferumoxytol] Itching   Tolerated Feraheme 6/26 & 7/21 pre-medications prior to medication        Medication List        Accurate as of March 07, 2022  9:12 AM. If you have any questions, ask your nurse or doctor.          Accu-Chek FastClix Lancets Misc Use to test blood glucose 3 times daily.   Accu-Chek Guide test strip Generic drug: glucose blood Use as instructed   Accu-Chek Guide w/Device Kit 1 each by Does not apply route 3 (three) times daily. The patient is insulin requiring, ICD 10 code E11.65. The patient tests 3 times a day.   amLODipine 10 MG tablet Commonly known as: NORVASC Take 1 tablet (10 mg total) by mouth daily.   calcium-vitamin D 500-200 MG-UNIT tablet Commonly known as: OSCAL WITH D Take 1 tablet by mouth 2 (two) times daily.   carvedilol 3.125 MG tablet Commonly known as: COREG Take 1 tablet (3.125 mg total) by mouth 2 (two) times daily.   colchicine 0.6 MG tablet Take 0.6 mg by mouth daily as needed.   Dulaglutide 0.75 MG/0.5ML Sopn Inject 0.75 mg into the skin once a week. Sunday   Trulicity 5.02 DX/4.1OI Sopn Generic drug:  Dulaglutide INJECT 0.75 MG INTO THE SKIN ONCE A WEEK.   ergocalciferol 1.25 MG (50000 UT) capsule Commonly known as: Drisdol Take 1 capsule by mouth weekly for 4 weeks then 1 capsule monthly for 4 months   famotidine 20 MG tablet Commonly known as: PEPCID Take 1 tablet (20 mg total) by mouth 2 (two) times daily.   gabapentin 400 MG capsule Commonly known as: NEURONTIN TAKE 1 CAPSULE BY MOUTH EVERY MORNING AND THEN TAKE 1 CAPSULE IN THE AFTERNOON; THEN 2  CAPSULES BY MOUTH BEFORE BEDTIME   losartan 100 MG tablet Commonly known as: COZAAR Take 1 tablet (100 mg total) by mouth daily.   magnesium oxide 400 (240 Mg) MG tablet Commonly known as: MAG-OX Take 1 tablet (400 mg total) by mouth daily.   metFORMIN 1000 MG tablet Commonly known as: GLUCOPHAGE Take 1 tablet (1,000 mg total) by mouth 2 (two) times daily.   omeprazole 20 MG capsule Commonly known as: PRILOSEC Take 1 capsule (20 mg total) by mouth daily.   ondansetron 4 MG tablet Commonly known as: Zofran Take 1 tablet (4 mg total) by mouth every 8 (eight) hours as needed for nausea or vomiting.   rosuvastatin 20 MG tablet Commonly known as: Crestor Take 1 tablet (20 mg total) by mouth daily.   sucralfate 1 GM/10ML suspension Commonly known as: Carafate Take 10 mLs (1 g total) by mouth 4 (four) times daily.         Past Medical History:  Diagnosis Date   Allergic rhinitis    Anemia, iron deficiency 05/03/2011   2/2 GI AVMs - recieves iron infusions as well as periodic red blood cell transfusions   Anxiety    Arteriovenous malformation of duodenum 02/25/2015   CARPAL TUNNEL RELEASE, HX OF 07/22/2008   Chest pain 10/28/2019   Colon polyps    Diabetes mellitus without complication ([place])    type 2   Difficulty sleeping    takes trazadone for sleep   Diverticulosis    Electrolyte abnormality 01/25/2021   Fall 01/03/2020   GERD (gastroesophageal reflux disease)    GI bleed 07/07/2020   Hepatitis C    in SVR with Harvoni 12/2014-02/2015.    History of hernia repair 08/18/2012   History of porphyria 11/30/2014   Porphyria cutanea tarda in setting of chronic Hepatitis C, frequent  IV iron infusions, and alcohol abuse    Hypertension    Hypocalcemia 10/28/2019   Hypocalcemia 01/19/2021   Hypokalemia 09/27/2014   Hypokalemia 01/19/2021   Hypomagnesemia 01/19/2021   Peripheral vascular disease ([place])    Prolonged QT interval 01/19/2021   Stroke ([place]) 2010   no residual deficits    Substance use disorder    cocaine   Tachycardia 07/10/2017   Trichomonas vaginitis 04/16/2019   Review of Systems:  per HPI.   Physical Exam: Vitals:   03/07/22 0857  BP: (!) 168/86  Pulse: 78  Temp: 98 F (36.7 C)  TempSrc: Oral  SpO2: 100%  Weight: 147 lb 3.2 oz (66.8 kg)   Constitutional: appears sad  Cardiovascular: Regular rate, regular rhythm. No murmurs, rubs, or gallops. Normal radial and PT pulses bilaterally. No LE edema.  Pulmonary: Normal respiratory effort. No wheezes, rales, or rhonchi.   Abdominal: Soft. Non-distended. No tenderness. Normal bowel sounds.  Musculoskeletal: Left AKA. Normal range of motion. Mild scattered calluses on right foot diffusely.    Neurological: Alert and oriented to person, place, and time. Non-focal. Normal sensation on diabetic foot exam.  Skin:  warm and dry.    Assessment & Plan:   See Encounters Tab for problem based charting.  Patient seen with Dr. Evette Doffing

## 2022-03-07 NOTE — Telephone Encounter (Signed)
Unable to leave message with follow-up appointment per 10/10 los. Mailed calendar.

## 2022-03-07 NOTE — Progress Notes (Signed)
I discussed these results with the patient during our visit today.

## 2022-03-07 NOTE — Assessment & Plan Note (Signed)
Current medications include metFORMIN 1000 MG BID and Dulaglutide 0.75 MG once weekly. Patient states that she is compliant with this medication. Patient endorses intermittent fatigue but denies polyuria or polydipsia. A1c was 5.0 in 03/2021. A1c today 5.3. Patient states that she does visit the ophthalmologist for yearly eye exams. Mild scattered calluses on right foot diffusely with any ulcerations on exam. Normal sensation of her right foot. Left AKA.    Plan: - Continue metFORMIN 1000 MG BID and Dulaglutide 0.75 MG once weekly

## 2022-03-07 NOTE — Patient Instructions (Addendum)
Thank you for coming to see [name] in clinic [name].   Plan: - Please continue taking losartan 100 MG, amLODipine 10 MG, and carvedilol 3.125 MG twice daily for your high blood pressure - We will prescribe you spironolactone 25 mg for your high blood pressure - We will see you back in clinic in 2 weeks to get blood work to check on your kidney function and electrolytes after starting this new medication - We will draw blood for you today to check your cholesterol (we will call you with these results) - We will refer you to speak with a psychologist  - We will refer you to have a mammogram done - We gave you the influenza vaccine and pneumonia vaccine today  It was very nice to meet you.

## 2022-03-07 NOTE — Assessment & Plan Note (Signed)
>>  ASSESSMENT AND PLAN FOR TYPE 2 DIABETES MELLITUS WITH DIABETIC NEUROPATHY ([facility]) WRITTEN ON 03/07/2022 10:37 AM BY MAPP, GLADIES, MD  Current medications include metFORMIN  1000 MG BID and Dulaglutide  0.75 MG once weekly. Patient states that she is compliant with this medication. Patient endorses intermittent fatigue but denies polyuria or polydipsia. A1c was 5.0 in 03/2021. A1c today 5.3. Patient states that she does visit the ophthalmologist for yearly eye exams. Mild scattered calluses on right foot diffusely with any ulcerations on exam. Normal sensation of her right foot. Left AKA.    Plan: - Continue metFORMIN  1000 MG BID and Dulaglutide  0.75 MG once weekly

## 2022-03-07 NOTE — Assessment & Plan Note (Addendum)
Patient reports feeling down since the loss of her sister 2 weeks ago. Patient endorses difficulty falling asleep, decreased energy, trouble concentrating, and decreased interest in activities. Patient denies SI or HI. PHQ9 today is 20. Patient has seen a psychologist in the past and would like to go back to see them. She would not like to see a psychiatrist at the moment.   Plan: - Referral psychologist ordered today

## 2022-03-07 NOTE — Assessment & Plan Note (Signed)
Current medications include losartan 100 MG, amLODipine 10 MG, and carvedilol 3.125 MG BID. Patient states that she is compliant with these medications. Patient states that she does check her BP regularly at home. Patient reports intermittent HA at home that improves with tylenol. Patient denies lightheadedness, dizziness, CP, or SOB. Initial BP today is 168/86. Repeat BP is 161/93. Patient had severe electrolyte derangements in the past with thiazide diuretics. Will try spironolactone.   Plan: - Start Spironolactone 25 mg - F/u visit in 2 weeks for repeat BMP (kidney function and electrolytes)

## 2022-03-07 NOTE — Assessment & Plan Note (Signed)
Patient given the influenza vaccine and pneumonia vaccine today. Referral for mammogram ordered today.

## 2022-03-07 NOTE — Assessment & Plan Note (Signed)
Current medications include rosuvastatin 20 MG. Patient states that she is compliant with these medications. Lipid panel from 10/2020 WNL w/ exception of TG elevated at 222. LDL 90.     Plan: - Lipid panel today - Continue rosuvastatin 20 MG

## 2022-03-08 ENCOUNTER — Other Ambulatory Visit: Payer: Self-pay

## 2022-03-08 ENCOUNTER — Inpatient Hospital Stay (HOSPITAL_BASED_OUTPATIENT_CLINIC_OR_DEPARTMENT_OTHER): Payer: 59 | Admitting: Physician Assistant

## 2022-03-08 ENCOUNTER — Inpatient Hospital Stay: Payer: 59

## 2022-03-08 VITALS — BP 175/91 | HR 103 | Temp 99.2°F | Resp 20

## 2022-03-08 DIAGNOSIS — C19 Malignant neoplasm of rectosigmoid junction: Secondary | ICD-10-CM

## 2022-03-08 DIAGNOSIS — K922 Gastrointestinal hemorrhage, unspecified: Secondary | ICD-10-CM

## 2022-03-08 DIAGNOSIS — D649 Anemia, unspecified: Secondary | ICD-10-CM

## 2022-03-08 DIAGNOSIS — T8092XA Unspecified transfusion reaction, initial encounter: Secondary | ICD-10-CM

## 2022-03-08 DIAGNOSIS — D500 Iron deficiency anemia secondary to blood loss (chronic): Secondary | ICD-10-CM | POA: Diagnosis not present

## 2022-03-08 LAB — LIPID PANEL
Chol/HDL Ratio: 4.7 ratio — ABNORMAL HIGH (ref 0.0–4.4)
Cholesterol, Total: 164 mg/dL (ref 100–199)
HDL: 35 mg/dL — ABNORMAL LOW (ref >39)
LDL Chol Calc (NIH): 106 mg/dL — ABNORMAL HIGH (ref 0–99)
Triglycerides: 125 mg/dL (ref 0–149)
VLDL Cholesterol Cal: 23 mg/dL (ref 5–40)

## 2022-03-08 LAB — AFP TUMOR MARKER: AFP, Serum, Tumor Marker: 2.2 ng/mL (ref 0.0–9.2)

## 2022-03-08 MED ORDER — FAMOTIDINE IN NACL 20-0.9 MG/50ML-% IV SOLN
20.0000 mg | Freq: Once | INTRAVENOUS | Status: AC
  Administered 2022-03-08: 20 mg via INTRAVENOUS

## 2022-03-08 MED ORDER — ACETAMINOPHEN 325 MG PO TABS
ORAL_TABLET | ORAL | Status: AC
  Filled 2022-03-08: qty 2

## 2022-03-08 MED ORDER — DIPHENHYDRAMINE HCL 50 MG/ML IJ SOLN
25.0000 mg | Freq: Once | INTRAMUSCULAR | Status: AC
  Administered 2022-03-08: 25 mg via INTRAVENOUS

## 2022-03-08 MED ORDER — METHYLPREDNISOLONE SODIUM SUCC 125 MG IJ SOLR
125.0000 mg | Freq: Once | INTRAMUSCULAR | Status: AC
  Administered 2022-03-08: 125 mg via INTRAVENOUS

## 2022-03-08 MED ORDER — ACETAMINOPHEN 325 MG PO TABS
650.0000 mg | ORAL_TABLET | Freq: Once | ORAL | Status: AC
  Administered 2022-03-08: 650 mg via ORAL
  Filled 2022-03-08: qty 2

## 2022-03-08 MED ORDER — SODIUM CHLORIDE 0.9% IV SOLUTION
250.0000 mL | Freq: Once | INTRAVENOUS | Status: AC
  Administered 2022-03-08: 250 mL via INTRAVENOUS

## 2022-03-08 MED ORDER — DIPHENHYDRAMINE HCL 25 MG PO CAPS
25.0000 mg | ORAL_CAPSULE | Freq: Once | ORAL | Status: AC
  Administered 2022-03-08: 25 mg via ORAL
  Filled 2022-03-08: qty 1

## 2022-03-08 MED ORDER — ACETAMINOPHEN 325 MG PO TABS
650.0000 mg | ORAL_TABLET | Freq: Once | ORAL | Status: AC
  Administered 2022-03-08: 650 mg via ORAL

## 2022-03-08 MED ORDER — MORPHINE SULFATE (PF) 2 MG/ML IV SOLN
2.0000 mg | Freq: Once | INTRAVENOUS | Status: AC
  Administered 2022-03-08: 2 mg via INTRAVENOUS
  Filled 2022-03-08: qty 1

## 2022-03-08 NOTE — Progress Notes (Signed)
Internal Medicine Clinic Attending  I saw and evaluated the patient.  I personally confirmed the key portions of the history and exam documented by Dr. Mapp and I reviewed pertinent patient test results.  The assessment, diagnosis, and plan were formulated together and I agree with the documentation in the resident's note.  

## 2022-03-08 NOTE — Progress Notes (Signed)
Patient blood started at 1425 on 03/08/2022. At 1542, patient complained of being cold and this RN observed pt visibly shaking. She also complained of moderate to severe back pain in her mid to low back. At 1543 blood was stopped and vitals taken. Liter bag of saline hung and free flowing to pt. [place], [place] and Dr. Burr Medico were called to chairside to assess. Medications given per verbal order: Famotidine '20mg'$  at 1546 Solu-medrol '125mg'$  at 1549 Diphenhydramine '25mg'$  at 1551 Morphine '2mg'$  at 1554  Vitals: 1546: T-98.7, P 86, R 20, BP 216/166, O2 100% 1553: T-101.4, P 80, R 20, BP 205/168, O2 100% 1600: T- 100.1, P 88, R 20, BP 218/99, O2 98% 1606: P 85, R, 20, BP 198/119, O2 98% 1612: T-98.6, P 87, R 20, BP 221/102, O2 99% 1617: P 99, R 19, BP 192/98, O2 99% 1625: T- 99.2, R- 20, P 103, BP 175/91, O2 97%  Blood and blood tubing taken by this RN to blood bank once patient became stabilized. Urine specimen and blood specimen collected and taken to blood bank. Patient refused to go to the ED or stay inpatient overnight for observation. She was made aware of the risks of going home without proper monitoring, and she verbalized understanding. Patient was taken by wheelchair to lobby.

## 2022-03-08 NOTE — Addendum Note (Signed)
Addended by: Lalla Brothers T on: 03/08/2022 09:24 AM   Modules accepted: Level of Service

## 2022-03-08 NOTE — Patient Instructions (Signed)

## 2022-03-08 NOTE — Progress Notes (Addendum)
DATE:  03/08/22                                              X TRANSFUSION REACTION     MD: Burr Medico   AGENT/BLOOD PRODUCT RECEIVING TODAY:              1 unit blood   AGENT/BLOOD PRODUCT RECEIVING IMMEDIATELY PRIOR TO REACTION:          blood   VS: BP:     216/166   P:       91       SPO2:       100% RA          T: 101.4      BP:     169/89    P:       84       SPO2:       99%     REACTION(S):           back pain, fever   PREMEDS:     tylenol 650 mg, benadryl 25 mg IV   INTERVENTION: Pepcid 20 mg IV, Solumedrol 125 mg IV,  morphine 2 mg IV   Review of Systems  Review of Systems  Constitutional:  Positive for fever.  Musculoskeletal:  Positive for back pain.  All other systems reviewed and are negative.    Physical Exam  Physical Exam Vitals and nursing note reviewed.  Constitutional:      Appearance: She is well-developed. She is ill-appearing. She is not toxic-appearing or diaphoretic.  HENT:     Head: Normocephalic.     Nose: Nose normal.  Eyes:     Conjunctiva/sclera: Conjunctivae normal.  Neck:     Vascular: No JVD.  Cardiovascular:     Rate and Rhythm: Normal rate and regular rhythm.     Pulses: Normal pulses.     Heart sounds: Normal heart sounds.  Pulmonary:     Effort: Pulmonary effort is normal.     Breath sounds: Normal breath sounds.  Abdominal:     General: There is no distension.  Musculoskeletal:     Cervical back: Normal range of motion.     Comments: Tenderness to palpation to of paraspinal muscles of thoracic and lumbar spine.  Skin:    General: Skin is warm and dry.     Findings: No rash.  Neurological:     Mental Status: She is oriented to person, place, and time.     OUTCOME:           Patient halfway through blood transfusion when she began to experience severe low back pain. She has had a blood transfusion in the past without reaction. Vitals checked and she was febrile to 101.4 and hypertensive. Patient's symptoms improved after  interventions. Blood transfusion reaction protocol initiated, labs and UA collected. Symptoms resolved after 1.5 hour observation. With a concern for hemolytic reaction the patient should be admitted for observation. I had multiple discussions about this with the patient and planned to direct admit her. She is refusing admission. I discussed the risks of going home to include worsening pain, another reaction, infection, death. I notified Dr. Burr Medico of her decision and she also tried to encourage the patient to stay for admission.   ED precautions were discussed with the patient. I will call patient tomorrow  and check on her. She should return next week for CBC to check hemoglobin.   I have spent a total of 30 minutes minutes of face-to-face and non-face-to-face time, preparing to see the patient, obtaining and/or reviewing separately obtained history, performing a medically appropriate examination, counseling and educating the patient, ordering medications, documenting clinical information in the electronic health record, and care coordination (communications with other health care professionals or caregivers).

## 2022-03-09 LAB — BPAM RBC
Blood Product Expiration Date: 202311032359
ISSUE DATE / TIME: 202310121404
Unit Type and Rh: 6200

## 2022-03-09 LAB — TYPE AND SCREEN
ABO/RH(D): AB POS
Antibody Screen: NEGATIVE
Unit division: 0

## 2022-03-09 NOTE — Progress Notes (Signed)
Patient called and updated on the results of her lipid panel. Discussed importance of continuing rosuvastatin 20 mg.

## 2022-03-12 ENCOUNTER — Other Ambulatory Visit: Payer: Self-pay

## 2022-03-12 ENCOUNTER — Telehealth: Payer: Self-pay

## 2022-03-12 DIAGNOSIS — T8092XA Unspecified transfusion reaction, initial encounter: Secondary | ICD-10-CM

## 2022-03-12 NOTE — Progress Notes (Signed)
CBC and sample to bb orders entered for upcoming lab appointment.

## 2022-03-12 NOTE — Telephone Encounter (Signed)
RN called patient per Sherol Dade, PA request. Patient reports she is feeling fine after blood transfusion reaction on 10/12. Patient agreed to come to [facility] for a lab recheck on 10/18 at [place]. [facility] Po[facility] ride was confirmed and patient verbalized understanding that she will be picked up at 0800 10/18 for labs.

## 2022-03-14 ENCOUNTER — Other Ambulatory Visit: Payer: Self-pay

## 2022-03-14 ENCOUNTER — Inpatient Hospital Stay: Payer: 59

## 2022-03-14 DIAGNOSIS — T8092XA Unspecified transfusion reaction, initial encounter: Secondary | ICD-10-CM

## 2022-03-14 DIAGNOSIS — D500 Iron deficiency anemia secondary to blood loss (chronic): Secondary | ICD-10-CM | POA: Diagnosis not present

## 2022-03-14 LAB — CBC WITH DIFFERENTIAL (CANCER CENTER ONLY)
Abs Immature Granulocytes: 0.15 10*3/uL — ABNORMAL HIGH (ref 0.00–0.07)
Basophils Absolute: 0 10*3/uL (ref 0.0–0.1)
Basophils Relative: 0 %
Eosinophils Absolute: 0.2 10*3/uL (ref 0.0–0.5)
Eosinophils Relative: 2 %
HCT: 27.5 % — ABNORMAL LOW (ref 36.0–46.0)
Hemoglobin: 8.6 g/dL — ABNORMAL LOW (ref 12.0–15.0)
Immature Granulocytes: 2 %
Lymphocytes Relative: 13 %
Lymphs Abs: 1 10*3/uL (ref 0.7–4.0)
MCH: 28.6 pg (ref 26.0–34.0)
MCHC: 31.3 g/dL (ref 30.0–36.0)
MCV: 91.4 fL (ref 80.0–100.0)
Monocytes Absolute: 0.7 10*3/uL (ref 0.1–1.0)
Monocytes Relative: 10 %
Neutro Abs: 5.5 10*3/uL (ref 1.7–7.7)
Neutrophils Relative %: 73 %
Platelet Count: 285 10*3/uL (ref 150–400)
RBC: 3.01 MIL/uL — ABNORMAL LOW (ref 3.87–5.11)
RDW: 16.3 % — ABNORMAL HIGH (ref 11.5–15.5)
WBC Count: 7.6 10*3/uL (ref 4.0–10.5)
nRBC: 0 % (ref 0.0–0.2)

## 2022-03-19 LAB — TRANSFUSION REACTION
DAT C3: NEGATIVE
Post RXN DAT IgG: NEGATIVE

## 2022-03-20 ENCOUNTER — Encounter: Payer: Self-pay | Admitting: Student

## 2022-03-20 ENCOUNTER — Other Ambulatory Visit (HOSPITAL_COMMUNITY): Payer: Self-pay

## 2022-03-20 ENCOUNTER — Ambulatory Visit (INDEPENDENT_AMBULATORY_CARE_PROVIDER_SITE_OTHER): Payer: 59 | Admitting: Student

## 2022-03-20 VITALS — BP 155/80 | HR 77 | Wt 152.2 lb

## 2022-03-20 DIAGNOSIS — E1140 Type 2 diabetes mellitus with diabetic neuropathy, unspecified: Secondary | ICD-10-CM

## 2022-03-20 DIAGNOSIS — F411 Generalized anxiety disorder: Secondary | ICD-10-CM

## 2022-03-20 DIAGNOSIS — E1122 Type 2 diabetes mellitus with diabetic chronic kidney disease: Secondary | ICD-10-CM

## 2022-03-20 DIAGNOSIS — E876 Hypokalemia: Secondary | ICD-10-CM

## 2022-03-20 DIAGNOSIS — N1831 Chronic kidney disease, stage 3a: Secondary | ICD-10-CM

## 2022-03-20 DIAGNOSIS — I129 Hypertensive chronic kidney disease with stage 1 through stage 4 chronic kidney disease, or unspecified chronic kidney disease: Secondary | ICD-10-CM

## 2022-03-20 DIAGNOSIS — I10 Essential (primary) hypertension: Secondary | ICD-10-CM

## 2022-03-20 DIAGNOSIS — F339 Major depressive disorder, recurrent, unspecified: Secondary | ICD-10-CM

## 2022-03-20 DIAGNOSIS — F1990 Other psychoactive substance use, unspecified, uncomplicated: Secondary | ICD-10-CM

## 2022-03-20 MED ORDER — METFORMIN HCL 1000 MG PO TABS
1000.0000 mg | ORAL_TABLET | Freq: Every day | ORAL | 0 refills | Status: DC
  Filled 2022-03-20: qty 90, 90d supply, fill #0

## 2022-03-20 MED ORDER — CARVEDILOL 6.25 MG PO TABS
6.2500 mg | ORAL_TABLET | Freq: Two times a day (BID) | ORAL | 1 refills | Status: DC
  Filled 2022-03-20: qty 60, 30d supply, fill #0

## 2022-03-20 NOTE — Patient Instructions (Addendum)
[name],  It was a pleasure seeing you in the clinic today.   We are increasing your carvedilol dose. Please pick up the new prescription and take the carvedilol twice a day (once in the morning and once in the evening). Please keep a blood pressure log at home like we discussed and bring to your next visit. Start taking your metformin only once a day (in the morning with breakfast). We are checking a blood lab today to make sure your electrolytes are good. I will call you with the results. We will place a new psychology referral per your request. Please come back in 1 week.  Please call our clinic at [phone] if you have any questions or concerns. The best time to call is Monday-Friday from 9am-4pm, but there is someone available 24/7 at the same number. If you need medication refills, please notify your pharmacy one week in advance and they will send [place] a request.   Thank you for letting us take part in your care. We look forward to seeing you next time!

## 2022-03-20 NOTE — Assessment & Plan Note (Signed)
>>  ASSESSMENT AND PLAN FOR TYPE 2 DIABETES MELLITUS WITH DIABETIC NEUROPATHY ([facility]) WRITTEN ON 03/20/2022  1:30 PM BY JINWALA, SAGAR, MD  Patient with history of T2DM complicated by neuropathy. She is currently taking metformin  1000mg  BID and trulicity  0.75mg  weekly. Her most recent A1c was 5.3% as of 2 weeks ago. Likely will not receive much added benefit from further A1c lowering and can continue with goal <7%. Will decrease metformin  to once daily to decrease pill burden. Repeat A1c in 3 months and if remains <7%, can consider discontinuing metformin  therapy and continue GLP-1a monotherapy given added CV and nephro-protection.   Plan: -continue trulicity  weekly -decrease metformin  to 1000mg  daily with breakfast -repeat A1c in 3 months

## 2022-03-20 NOTE — Assessment & Plan Note (Signed)
Patient here today for follow up of her HTN. She is on norvasc '10mg'$  daily, losartan '100mg'$  daily, carvedilol 3.'125mg'$  BID, and recently started on spironolactone '25mg'$  daily at last visit. Her BP today remains elevated, initially 171/83 and, on repeat, 155/80. She has been adherent with her antihypertensives, although she notes that she is only taking carvedilol once a day instead of prescribed BID dosing as she was told that she only needs to take it once a day. Her goal BP should be <130/80 given concomitant T2DM. Given elevated BP, will increase her carvedilol dose to 6.'25mg'$  BID and have clarified with patient to take it BID. She will keep a BP log at home until her next visit in 1 week so that we can get home BP readings. Will obtain a BMP today to check kidney function and electrolytes after initiation of spironolactone. Of note, patient would likely benefit from a combination antihypertensive moving forward to ease her pill burden.  Plan: -continue norvasc, losartan, spironolactone -increase carvedilol to 6.'25mg'$  BID -f/u BMP -BP log, bring to next visit -f/u in 1 week

## 2022-03-20 NOTE — Assessment & Plan Note (Signed)
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 She is currently taking metformin '1000mg'$  BID and trulicity 0.'75mg'$  weekly. Her most recent A1c was 5.3% as of 2 weeks ago. Likely will not receive much added benefit from further A1c lowering and can continue with goal <7%. Will decrease metformin to once daily to decrease pill burden. Repeat A1c in 3 months and if remains <7%, can consider discontinuing metformin therapy and continue GLP-1a monotherapy given added CV and nephro-protection.   Plan: -continue trulicity weekly -decrease metformin to '1000mg'$  daily with breakfast -repeat A1c in 3 months

## 2022-03-20 NOTE — Assessment & Plan Note (Signed)
Patient was referred to psychology at last visit (see Dr. Gilmer Mor note), but the psychology group she was sent to does not accept her insurance and she would need to pay OOP costs that are cost-prohibitive for her. She previously followed [facility], but unable to have patient see them as well given they do not accept Medicare. Will place a repeat referral and have our front desk team help determine an appropriate psychology group for her.

## 2022-03-20 NOTE — Progress Notes (Signed)
   CC: f/u HTN  HPI:  [name] is a 71 y.o. female with history listed below presenting to the [facility] for f/u HTN. Please see individualized problem based charting for full HPI.  Past Medical History:  Diagnosis Date   Allergic rhinitis    Anemia, iron deficiency 05/03/2011   2/2 GI AVMs - recieves iron infusions as well as periodic red blood cell transfusions   Anxiety    Arteriovenous malformation of duodenum 02/25/2015   CARPAL TUNNEL RELEASE, HX OF 07/22/2008   Chest pain 10/28/2019   Colon polyps    Diabetes mellitus without complication ([place])    type 2   Difficulty sleeping    takes trazadone for sleep   Diverticulosis    Electrolyte abnormality 01/25/2021   Fall 01/03/2020   GERD (gastroesophageal reflux disease)    GI bleed 07/07/2020   Hepatitis C    in SVR with Harvoni 12/2014-02/2015.    History of hernia repair 08/18/2012   History of porphyria 11/30/2014   Porphyria cutanea tarda in setting of chronic Hepatitis C, frequent  IV iron infusions, and alcohol abuse    Hypertension    Hypocalcemia 10/28/2019   Hypocalcemia 01/19/2021   Hypokalemia 09/27/2014   Hypokalemia 01/19/2021   Hypomagnesemia 01/19/2021   Peripheral vascular disease ([place])    Prolonged QT interval 01/19/2021   Stroke ([place]) 2010   no residual deficits   Substance use disorder    cocaine   Tachycardia 07/10/2017   Trichomonas vaginitis 04/16/2019    Review of Systems:  Negative aside from that listed in individualized problem based charting.  Physical Exam:  Vitals:   03/20/22 0859 03/20/22 0929  BP: (!) 171/83 (!) 155/80  Pulse: 84 77  SpO2: 100%   Weight: 152 lb 3.2 oz (69 kg)    Physical Exam Constitutional:      Appearance: Normal appearance. She is not ill-appearing.  HENT:     Mouth/Throat:     Mouth: Mucous membranes are moist.     Pharynx: Oropharynx is clear.  Eyes:     Extraocular Movements: Extraocular movements intact.     Conjunctiva/sclera: Conjunctivae normal.      Pupils: Pupils are equal, round, and reactive to light.  Cardiovascular:     Rate and Rhythm: Normal rate and regular rhythm.     Pulses: Normal pulses.     Heart sounds: Normal heart sounds. No murmur heard.    No friction rub. No gallop.  Pulmonary:     Effort: Pulmonary effort is normal.     Breath sounds: Normal breath sounds. No wheezing, rhonchi or rales.  Abdominal:     General: Bowel sounds are normal. There is no distension.     Palpations: Abdomen is soft.     Tenderness: There is no abdominal tenderness.  Musculoskeletal:     Comments: L AKA with prosthesis in place  Skin:    General: Skin is warm and dry.  Neurological:     General: No focal deficit present.     Mental Status: She is alert and oriented to person, place, and time.  Psychiatric:        Mood and Affect: Mood normal.        Behavior: Behavior normal.      Assessment & Plan:   See Encounters Tab for problem based charting.  Patient discussed with Dr.  Jimmye Norman

## 2022-03-21 ENCOUNTER — Encounter: Payer: Self-pay | Admitting: Student

## 2022-03-21 ENCOUNTER — Inpatient Hospital Stay: Payer: 59

## 2022-03-21 ENCOUNTER — Encounter: Payer: Self-pay | Admitting: Hematology

## 2022-03-21 ENCOUNTER — Other Ambulatory Visit: Payer: Self-pay

## 2022-03-21 DIAGNOSIS — C19 Malignant neoplasm of rectosigmoid junction: Secondary | ICD-10-CM

## 2022-03-21 DIAGNOSIS — D500 Iron deficiency anemia secondary to blood loss (chronic): Secondary | ICD-10-CM

## 2022-03-21 LAB — CBC WITH DIFFERENTIAL (CANCER CENTER ONLY)
Abs Immature Granulocytes: 0.06 10*3/uL (ref 0.00–0.07)
Basophils Absolute: 0 10*3/uL (ref 0.0–0.1)
Basophils Relative: 1 %
Eosinophils Absolute: 0.1 10*3/uL (ref 0.0–0.5)
Eosinophils Relative: 2 %
HCT: 29.8 % — ABNORMAL LOW (ref 36.0–46.0)
Hemoglobin: 9.2 g/dL — ABNORMAL LOW (ref 12.0–15.0)
Immature Granulocytes: 1 %
Lymphocytes Relative: 16 %
Lymphs Abs: 1 10*3/uL (ref 0.7–4.0)
MCH: 28.2 pg (ref 26.0–34.0)
MCHC: 30.9 g/dL (ref 30.0–36.0)
MCV: 91.4 fL (ref 80.0–100.0)
Monocytes Absolute: 0.6 10*3/uL (ref 0.1–1.0)
Monocytes Relative: 9 %
Neutro Abs: 4.5 10*3/uL (ref 1.7–7.7)
Neutrophils Relative %: 71 %
Platelet Count: 354 10*3/uL (ref 150–400)
RBC: 3.26 MIL/uL — ABNORMAL LOW (ref 3.87–5.11)
RDW: 16.1 % — ABNORMAL HIGH (ref 11.5–15.5)
WBC Count: 6.3 10*3/uL (ref 4.0–10.5)
nRBC: 0 % (ref 0.0–0.2)

## 2022-03-21 LAB — BMP8+ANION GAP
Anion Gap: 12 mmol/L (ref 10.0–18.0)
BUN/Creatinine Ratio: 13 (ref 12–28)
BUN: 18 mg/dL (ref 8–27)
CO2: 22 mmol/L (ref 20–29)
Calcium: 8.6 mg/dL — ABNORMAL LOW (ref 8.7–10.3)
Chloride: 107 mmol/L — ABNORMAL HIGH (ref 96–106)
Creatinine, Ser: 1.37 mg/dL — ABNORMAL HIGH (ref 0.57–1.00)
Glucose: 91 mg/dL (ref 70–99)
Potassium: 4.7 mmol/L (ref 3.5–5.2)
Sodium: 141 mmol/L (ref 134–144)
eGFR: 42 mL/min/{1.73_m2} — ABNORMAL LOW (ref >59)

## 2022-03-21 LAB — FERRITIN: Ferritin: 10 ng/mL — ABNORMAL LOW (ref 11–307)

## 2022-03-21 LAB — IRON AND IRON BINDING CAPACITY (CC-WL,HP ONLY)
Iron: 27 ug/dL — ABNORMAL LOW (ref 28–170)
Saturation Ratios: 7 % — ABNORMAL LOW (ref 10.4–31.8)
TIBC: 379 ug/dL (ref 250–450)
UIBC: 352 ug/dL (ref 148–442)

## 2022-03-21 LAB — TOTAL PROTEIN, URINE DIPSTICK: Protein, ur: >2000 mg/dL — AB

## 2022-03-21 NOTE — Progress Notes (Signed)
Patient called.  Unable to reach patient x2, no voicemail set up. Kidney function stable after initiation of spironolactone. Hypokalemia resolved.

## 2022-03-27 ENCOUNTER — Other Ambulatory Visit (HOSPITAL_COMMUNITY): Payer: Self-pay

## 2022-03-27 LAB — HM DIABETES EYE EXAM

## 2022-03-28 ENCOUNTER — Other Ambulatory Visit: Payer: Self-pay

## 2022-03-28 ENCOUNTER — Telehealth: Payer: Self-pay

## 2022-03-28 NOTE — Telephone Encounter (Signed)
Was able to leave VM on patient's mobile phone to let her know that Dr. Burr Medico has ordered two iron infusions for low iron levels. Scheduling will reach out to patient with dates and times.  Encouraged patient to call with questions or concerns.

## 2022-03-29 ENCOUNTER — Encounter: Payer: 59 | Admitting: Student

## 2022-03-29 NOTE — Progress Notes (Signed)
Internal Medicine Clinic Attending  Case discussed with Dr. Jinwala  At the time of the visit.  We reviewed the resident's history and exam and pertinent patient test results.  I agree with the assessment, diagnosis, and plan of care documented in the resident's note.  

## 2022-04-03 ENCOUNTER — Inpatient Hospital Stay: Payer: 59 | Attending: Hematology

## 2022-04-03 ENCOUNTER — Other Ambulatory Visit: Payer: Self-pay

## 2022-04-03 ENCOUNTER — Inpatient Hospital Stay: Payer: 59

## 2022-04-03 ENCOUNTER — Other Ambulatory Visit: Payer: 59

## 2022-04-03 ENCOUNTER — Ambulatory Visit: Payer: 59

## 2022-04-03 ENCOUNTER — Telehealth: Payer: Self-pay | Admitting: Hematology

## 2022-04-03 VITALS — BP 164/82 | HR 79 | Temp 97.7°F | Resp 16

## 2022-04-03 DIAGNOSIS — K922 Gastrointestinal hemorrhage, unspecified: Secondary | ICD-10-CM

## 2022-04-03 DIAGNOSIS — N189 Chronic kidney disease, unspecified: Secondary | ICD-10-CM | POA: Diagnosis not present

## 2022-04-03 DIAGNOSIS — F1410 Cocaine abuse, uncomplicated: Secondary | ICD-10-CM | POA: Insufficient documentation

## 2022-04-03 DIAGNOSIS — Z89612 Acquired absence of left leg above knee: Secondary | ICD-10-CM | POA: Diagnosis not present

## 2022-04-03 DIAGNOSIS — G4700 Insomnia, unspecified: Secondary | ICD-10-CM | POA: Diagnosis not present

## 2022-04-03 DIAGNOSIS — I129 Hypertensive chronic kidney disease with stage 1 through stage 4 chronic kidney disease, or unspecified chronic kidney disease: Secondary | ICD-10-CM | POA: Insufficient documentation

## 2022-04-03 DIAGNOSIS — E1136 Type 2 diabetes mellitus with diabetic cataract: Secondary | ICD-10-CM | POA: Insufficient documentation

## 2022-04-03 DIAGNOSIS — D500 Iron deficiency anemia secondary to blood loss (chronic): Secondary | ICD-10-CM

## 2022-04-03 DIAGNOSIS — K5520 Angiodysplasia of colon without hemorrhage: Secondary | ICD-10-CM | POA: Insufficient documentation

## 2022-04-03 DIAGNOSIS — E1122 Type 2 diabetes mellitus with diabetic chronic kidney disease: Secondary | ICD-10-CM | POA: Insufficient documentation

## 2022-04-03 DIAGNOSIS — Z79899 Other long term (current) drug therapy: Secondary | ICD-10-CM | POA: Insufficient documentation

## 2022-04-03 DIAGNOSIS — B182 Chronic viral hepatitis C: Secondary | ICD-10-CM | POA: Insufficient documentation

## 2022-04-03 DIAGNOSIS — E1151 Type 2 diabetes mellitus with diabetic peripheral angiopathy without gangrene: Secondary | ICD-10-CM | POA: Diagnosis not present

## 2022-04-03 LAB — CBC WITH DIFFERENTIAL (CANCER CENTER ONLY)
Abs Immature Granulocytes: 0.05 10*3/uL (ref 0.00–0.07)
Basophils Absolute: 0 10*3/uL (ref 0.0–0.1)
Basophils Relative: 0 %
Eosinophils Absolute: 0.2 10*3/uL (ref 0.0–0.5)
Eosinophils Relative: 3 %
HCT: 30.3 % — ABNORMAL LOW (ref 36.0–46.0)
Hemoglobin: 9.5 g/dL — ABNORMAL LOW (ref 12.0–15.0)
Immature Granulocytes: 1 %
Lymphocytes Relative: 17 %
Lymphs Abs: 1.2 10*3/uL (ref 0.7–4.0)
MCH: 27.8 pg (ref 26.0–34.0)
MCHC: 31.4 g/dL (ref 30.0–36.0)
MCV: 88.6 fL (ref 80.0–100.0)
Monocytes Absolute: 0.9 10*3/uL (ref 0.1–1.0)
Monocytes Relative: 13 %
Neutro Abs: 4.7 10*3/uL (ref 1.7–7.7)
Neutrophils Relative %: 66 %
Platelet Count: 304 10*3/uL (ref 150–400)
RBC: 3.42 MIL/uL — ABNORMAL LOW (ref 3.87–5.11)
RDW: 16.1 % — ABNORMAL HIGH (ref 11.5–15.5)
WBC Count: 7.1 10*3/uL (ref 4.0–10.5)
nRBC: 0 % (ref 0.0–0.2)

## 2022-04-03 LAB — IRON AND IRON BINDING CAPACITY (CC-WL,HP ONLY)
Iron: 62 ug/dL (ref 28–170)
Saturation Ratios: 17 % (ref 10.4–31.8)
TIBC: 377 ug/dL (ref 250–450)
UIBC: 315 ug/dL (ref 148–442)

## 2022-04-03 LAB — TOTAL PROTEIN, URINE DIPSTICK: Protein, ur: 300 mg/dL — AB

## 2022-04-03 MED ORDER — SODIUM CHLORIDE 0.9 % IV SOLN: Freq: Once | INTRAVENOUS | Status: AC

## 2022-04-03 MED ORDER — SODIUM CHLORIDE 0.9 % IV SOLN
5.0000 mg/kg | Freq: Once | INTRAVENOUS | Status: AC
  Administered 2022-04-03: 350 mg via INTRAVENOUS
  Filled 2022-04-03: qty 14

## 2022-04-03 NOTE — Patient Instructions (Signed)
[facility]  Discharge Instructions: Thank you for choosing [facility] to provide your oncology and hematology care.   If you have a lab appointment with the [facility], please go directly to the [facility] and check in at the registration area.   Wear comfortable clothing and clothing appropriate for easy access to any Portacath or PICC line.   We strive to give you quality time with your provider. You may need to reschedule your appointment if you arrive late (15 or more minutes).  Arriving late affects you and other patients whose appointments are after yours.  Also, if you miss three or more appointments without notifying the office, you may be dismissed from the clinic at the provider's discretion.      For prescription refill requests, have your pharmacy contact our office and allow 72 hours for refills to be completed.    Today you received the following chemotherapy and/or immunotherapy agents: Bevacizumab      To help prevent nausea and vomiting after your treatment, we encourage you to take your nausea medication as directed.  BELOW ARE SYMPTOMS THAT SHOULD BE REPORTED IMMEDIATELY: *FEVER GREATER THAN 100.4 F (38 C) OR HIGHER *CHILLS OR SWEATING *NAUSEA AND VOMITING THAT IS NOT CONTROLLED WITH YOUR NAUSEA MEDICATION *UNUSUAL SHORTNESS OF BREATH *UNUSUAL BRUISING OR BLEEDING *URINARY PROBLEMS (pain or burning when urinating, or frequent urination) *BOWEL PROBLEMS (unusual diarrhea, constipation, pain near the anus) TENDERNESS IN MOUTH AND THROAT WITH OR WITHOUT PRESENCE OF ULCERS (sore throat, sores in mouth, or a toothache) UNUSUAL RASH, SWELLING OR PAIN  UNUSUAL VAGINAL DISCHARGE OR ITCHING   Items with * indicate a potential emergency and should be followed up as soon as possible or go to the Emergency Department if any problems should occur.  Please show the CHEMOTHERAPY ALERT CARD or IMMUNOTHERAPY ALERT CARD at check-in  to the Emergency Department and triage nurse.  Should you have questions after your visit or need to cancel or reschedule your appointment, please contact [facility]  Dept: [phone]  and follow the prompts.  Office hours are 8:00 a.m. to 4:30 p.m. Monday - Friday. Please note that voicemails left after 4:00 p.m. may not be returned until the following business day.  We are closed weekends and major holidays. You have access to a nurse at all times for urgent questions. Please call the main number to the clinic Dept: [phone] and follow the prompts.   For any non-urgent questions, you may also contact your provider using MyChart. We now offer e-Visits for anyone 73 and older to request care online for non-urgent symptoms. For details visit [email or web address].   Also download the MyChart app! Go to the app store, search "MyChart", open the app, select [facility], and log in with your MyChart username and password.  Masks are optional in the cancer centers. If you would like for your care team to wear a mask while they are taking care of you, please let them know. For doctor visits, patients may have with them one support person who is at least 69 years old. At this time, visitors are not allowed in the infusion area.

## 2022-04-03 NOTE — Telephone Encounter (Signed)
Called patient per 11/6 in basket. Patient notified of upcoming appointments.

## 2022-04-04 ENCOUNTER — Other Ambulatory Visit (HOSPITAL_COMMUNITY): Payer: Self-pay

## 2022-04-09 ENCOUNTER — Other Ambulatory Visit: Payer: Self-pay

## 2022-04-09 ENCOUNTER — Inpatient Hospital Stay: Payer: 59

## 2022-04-09 VITALS — BP 168/83 | HR 73 | Temp 98.3°F | Resp 18

## 2022-04-09 DIAGNOSIS — D500 Iron deficiency anemia secondary to blood loss (chronic): Secondary | ICD-10-CM

## 2022-04-09 MED ORDER — DIPHENHYDRAMINE HCL 25 MG PO CAPS
25.0000 mg | ORAL_CAPSULE | Freq: Once | ORAL | Status: AC
  Administered 2022-04-09: 25 mg via ORAL
  Filled 2022-04-09: qty 1

## 2022-04-09 MED ORDER — SODIUM CHLORIDE 0.9 % IV SOLN: Freq: Once | INTRAVENOUS | Status: AC

## 2022-04-09 MED ORDER — FAMOTIDINE IN NACL 20-0.9 MG/50ML-% IV SOLN
20.0000 mg | Freq: Once | INTRAVENOUS | Status: AC
  Administered 2022-04-09: 20 mg via INTRAVENOUS
  Filled 2022-04-09: qty 50

## 2022-04-09 MED ORDER — SODIUM CHLORIDE 0.9 % IV SOLN
510.0000 mg | Freq: Once | INTRAVENOUS | Status: AC
  Administered 2022-04-09: 510 mg via INTRAVENOUS
  Filled 2022-04-09: qty 510

## 2022-04-09 MED ORDER — ACETAMINOPHEN 325 MG PO TABS
650.0000 mg | ORAL_TABLET | Freq: Once | ORAL | Status: AC
  Administered 2022-04-09: 650 mg via ORAL
  Filled 2022-04-09: qty 2

## 2022-04-09 NOTE — Patient Instructions (Signed)

## 2022-04-09 NOTE — Progress Notes (Signed)
Pt refused oral pre-medications prior to feraheme infusion. Pt advised that the purpose of pre-meds is to keep her from having a reaction to the infusion. Pt expressed understanding and continued to refuse to take pills. MD notified. RN left pre-meds at the bedside for pt to take if she felt so inclined.

## 2022-04-10 ENCOUNTER — Other Ambulatory Visit (HOSPITAL_COMMUNITY): Payer: Self-pay

## 2022-04-12 ENCOUNTER — Encounter: Payer: Self-pay | Admitting: Dietician

## 2022-04-12 ENCOUNTER — Telehealth: Payer: Self-pay | Admitting: Hematology

## 2022-04-13 ENCOUNTER — Other Ambulatory Visit: Payer: Self-pay | Admitting: Internal Medicine

## 2022-04-13 ENCOUNTER — Other Ambulatory Visit (HOSPITAL_COMMUNITY): Payer: Self-pay

## 2022-04-16 ENCOUNTER — Other Ambulatory Visit: Payer: Self-pay

## 2022-04-16 ENCOUNTER — Inpatient Hospital Stay: Payer: 59 | Admitting: Licensed Clinical Social Worker

## 2022-04-16 ENCOUNTER — Inpatient Hospital Stay: Payer: 59

## 2022-04-16 ENCOUNTER — Inpatient Hospital Stay (HOSPITAL_BASED_OUTPATIENT_CLINIC_OR_DEPARTMENT_OTHER): Payer: 59 | Admitting: Hematology

## 2022-04-16 ENCOUNTER — Encounter: Payer: Self-pay | Admitting: Hematology

## 2022-04-16 ENCOUNTER — Other Ambulatory Visit: Payer: Self-pay | Admitting: Hematology

## 2022-04-16 VITALS — BP 152/88 | HR 80 | Temp 98.8°F | Resp 18

## 2022-04-16 DIAGNOSIS — D500 Iron deficiency anemia secondary to blood loss (chronic): Secondary | ICD-10-CM

## 2022-04-16 DIAGNOSIS — C19 Malignant neoplasm of rectosigmoid junction: Secondary | ICD-10-CM

## 2022-04-16 DIAGNOSIS — K922 Gastrointestinal hemorrhage, unspecified: Secondary | ICD-10-CM

## 2022-04-16 LAB — CBC WITH DIFFERENTIAL (CANCER CENTER ONLY)
Abs Immature Granulocytes: 0.05 10*3/uL (ref 0.00–0.07)
Basophils Absolute: 0 10*3/uL (ref 0.0–0.1)
Basophils Relative: 0 %
Eosinophils Absolute: 0.1 10*3/uL (ref 0.0–0.5)
Eosinophils Relative: 2 %
HCT: 31.6 % — ABNORMAL LOW (ref 36.0–46.0)
Hemoglobin: 9.9 g/dL — ABNORMAL LOW (ref 12.0–15.0)
Immature Granulocytes: 1 %
Lymphocytes Relative: 13 %
Lymphs Abs: 1 10*3/uL (ref 0.7–4.0)
MCH: 27.3 pg (ref 26.0–34.0)
MCHC: 31.3 g/dL (ref 30.0–36.0)
MCV: 87.3 fL (ref 80.0–100.0)
Monocytes Absolute: 0.8 10*3/uL (ref 0.1–1.0)
Monocytes Relative: 10 %
Neutro Abs: 5.9 10*3/uL (ref 1.7–7.7)
Neutrophils Relative %: 74 %
Platelet Count: 266 10*3/uL (ref 150–400)
RBC: 3.62 MIL/uL — ABNORMAL LOW (ref 3.87–5.11)
RDW: 17.1 % — ABNORMAL HIGH (ref 11.5–15.5)
WBC Count: 7.9 10*3/uL (ref 4.0–10.5)
nRBC: 0 % (ref 0.0–0.2)

## 2022-04-16 LAB — CMP (CANCER CENTER ONLY)
ALT: 19 U/L (ref 0–44)
AST: 27 U/L (ref 15–41)
Albumin: 3.9 g/dL (ref 3.5–5.0)
Alkaline Phosphatase: 101 U/L (ref 38–126)
Anion gap: 8 (ref 5–15)
BUN: 23 mg/dL (ref 8–23)
CO2: 26 mmol/L (ref 22–32)
Calcium: 8 mg/dL — ABNORMAL LOW (ref 8.9–10.3)
Chloride: 107 mmol/L (ref 98–111)
Creatinine: 1.56 mg/dL — ABNORMAL HIGH (ref 0.44–1.00)
GFR, Estimated: 36 mL/min — ABNORMAL LOW (ref >60)
Glucose, Bld: 244 mg/dL — ABNORMAL HIGH (ref 70–99)
Potassium: 3.7 mmol/L (ref 3.5–5.1)
Sodium: 141 mmol/L (ref 135–145)
Total Bilirubin: 0.2 mg/dL — ABNORMAL LOW (ref 0.3–1.2)
Total Protein: 7 g/dL (ref 6.5–8.1)

## 2022-04-16 LAB — RAPID URINE DRUG SCREEN, HOSP PERFORMED
Amphetamines: NOT DETECTED
Barbiturates: NOT DETECTED
Benzodiazepines: NOT DETECTED
Cocaine: POSITIVE — AB
Opiates: NOT DETECTED
Tetrahydrocannabinol: POSITIVE — AB

## 2022-04-16 LAB — SAMPLE TO BLOOD BANK

## 2022-04-16 LAB — IRON AND IRON BINDING CAPACITY (CC-WL,HP ONLY)
Iron: 60 ug/dL (ref 28–170)
Saturation Ratios: 17 % (ref 10.4–31.8)
TIBC: 353 ug/dL (ref 250–450)
UIBC: 293 ug/dL (ref 148–442)

## 2022-04-16 LAB — TOTAL PROTEIN, URINE DIPSTICK: Protein, ur: 300 mg/dL — AB

## 2022-04-16 MED ORDER — SODIUM CHLORIDE 0.9 % IV SOLN
510.0000 mg | Freq: Once | INTRAVENOUS | Status: AC
  Administered 2022-04-16: 510 mg via INTRAVENOUS
  Filled 2022-04-16: qty 510

## 2022-04-16 MED ORDER — FAMOTIDINE IN NACL 20-0.9 MG/50ML-% IV SOLN
20.0000 mg | Freq: Once | INTRAVENOUS | Status: AC
  Administered 2022-04-16: 20 mg via INTRAVENOUS
  Filled 2022-04-16: qty 50

## 2022-04-16 MED ORDER — SODIUM CHLORIDE 0.9 % IV SOLN: INTRAVENOUS | Status: DC

## 2022-04-16 MED ORDER — ACETAMINOPHEN 325 MG PO TABS
650.0000 mg | ORAL_TABLET | Freq: Once | ORAL | Status: AC
  Administered 2022-04-16: 650 mg via ORAL
  Filled 2022-04-16: qty 2

## 2022-04-16 MED ORDER — DIPHENHYDRAMINE HCL 25 MG PO CAPS
25.0000 mg | ORAL_CAPSULE | Freq: Once | ORAL | Status: AC
  Administered 2022-04-16: 25 mg via ORAL
  Filled 2022-04-16: qty 1

## 2022-04-16 NOTE — Progress Notes (Signed)
[facility]   [phone] [phone]   Clinic Follow up Note   Patient Care Team: Linus Galas, MD as PCP - Evalina Field, MD as Consulting Physician (Ophthalmology) Coralie Keens, MD as Consulting Physician (General Surgery) Truitt Merle, MD as Consulting Physician (Hematology) Comer, Okey Regal, MD as Consulting Physician (Infectious Diseases) Loletha Carrow Kirke Corin, MD as Consulting Physician (Gastroenterology)  Date of Service:  04/16/2022  CHIEF COMPLAINT: f/u of anemia   CURRENT THERAPY:  Bevacizumab, intermittently given proteinuria   ASSESSMENT:  [name] is a 71 y.o. female with   1. Iron deficiency anemia secondary to GI blood loss, and Anemia of Chronic Disease -longstanding history of intermittent anemia dating back at least 2008 when Hg was 10. -h/o multiple GI bleeding in 2016, required blood transfusion, hospitalization and IV Feraheme -EGD and colonoscopy on 10/29/17 and 03/17/18 with Dr. Loletha Carrow, which were normal. Endoscopy in [facility] and was found to have AVM in small bowel. -Due to recurrent anemia requiring IV Feraheme and blood transfusions, she started bevacizumab for GI bleeding on 08/10/20. She responded very well with resolved anemia and less need for iv iron. This has been held intermittently due to elevated urine protein. Last dose given 04/03/22. -labs reviewed, hgb 9.9, iron level pending  -We will continue to monitor.   2. Comorbidities: DM2, insomnia, GERD, chronic Hep C infection, cocaine use -Management per PCP's office.  -GERD uncontrolled on Tums and omeprazole 20 mg once daily -positive for hep C in 2009, received treatment in 2016. 05/2018 liver US unremarkable -h/o cocaine use. I again advised her to discontinue use due to effects on her health.   3. Left AKA, has prosthesis   4. CKD and proteinuria -Her urine protein was extremely high (>2052m/dl) on 10/27/21 but returned to 300 two  weeks later. -Probably related to her diabetes, and substance abuse.  I again encouraged her to stop cocaine use. -She will follow-up with her nephrologist   5. Substance abuse  -I again had a long conversation with patient about stopping cocaine and marijuana, she states that she has stopped using cocaine, still use marijuana occasionally -She understands I will check her urine for drug screening, would not give her bevacizumab if her test is positive for cocaine.   PLAN: -Her urine protein was 300, and also positive for cocaine, will hold on Avastin today. -We will proceed IV Feraheme today -Lab every 2 weeks, will give Avastin in 2 and 4 weeks if her urine cocaine is negative. -IV Feraheme as needed if ferritin below 30 -Follow-up in 6 weeks   INTERVAL HISTORY:  Patricia ELENBAASis here for a follow up of anemia. She was last seen by me on 03/06/22. She presents to the clinic alone. She reports she is feeling well today. She tells me she is planning to get married soon.   All other systems were reviewed with the patient and are negative.  MEDICAL HISTORY:  Past Medical History:  Diagnosis Date   Allergic rhinitis    Anemia, iron deficiency 05/03/2011   2/2 GI AVMs - recieves iron infusions as well as periodic red blood cell transfusions   Anxiety    Arteriovenous malformation of duodenum 02/25/2015   CARPAL TUNNEL RELEASE, HX OF 07/22/2008   Chest pain 10/28/2019   Colon polyps    Diabetes mellitus without complication ([place]    type 2   Difficulty sleeping    takes trazadone for sleep   Diverticulosis  Electrolyte abnormality 01/25/2021   Fall 01/03/2020   GERD (gastroesophageal reflux disease)    GI bleed 07/07/2020   Hepatitis C    in SVR with Harvoni 12/2014-02/2015.    History of hernia repair 08/18/2012   History of porphyria 11/30/2014   Porphyria cutanea tarda in setting of chronic Hepatitis C, frequent  IV iron infusions, and alcohol abuse    Hypertension     Hypocalcemia 10/28/2019   Hypocalcemia 01/19/2021   Hypokalemia 09/27/2014   Hypokalemia 01/19/2021   Hypomagnesemia 01/19/2021   Kidney disease 02/04/2019   Peripheral vascular disease ([place])    Prolonged QT interval 01/19/2021   Stroke ([place]) 2010   no residual deficits   Substance use disorder    cocaine   Tachycardia 07/10/2017   Trichomonas vaginitis 04/16/2019    SURGICAL HISTORY: Past Surgical History:  Procedure Laterality Date   ABDOMINAL HYSTERECTOMY     APPLICATION OF WOUND VAC N/A 06/21/2014   Procedure: APPLICATION OF WOUND VAC;  Surgeon: Coralie Keens, MD;  Location: [place];  Service: General;  Laterality: N/A;   CARPAL TUNNEL RELEASE     rt hand   COLONOSCOPY WITH PROPOFOL N/A 03/17/2018   Procedure: COLONOSCOPY WITH PROPOFOL;  Surgeon: Doran Stabler, MD;  Location: [facility];  Service: Gastroenterology;  Laterality: N/A;   ENTEROSCOPY N/A 12/04/2012   Procedure: ENTEROSCOPY;  Surgeon: Beryle Beams, MD;  Location: [facility];  Service: Endoscopy;  Laterality: N/A;   ENTEROSCOPY N/A 12/18/2012   Procedure: ENTEROSCOPY;  Surgeon: Beryle Beams, MD;  Location: [facility];  Service: Endoscopy;  Laterality: N/A;   ENTEROSCOPY N/A 02/25/2015   Procedure: ENTEROSCOPY;  Surgeon: Inda Castle, MD;  Location: [facility];  Service: Endoscopy;  Laterality: N/A;   ENTEROSCOPY N/A 10/29/2017   Procedure: ENTEROSCOPY;  Surgeon: Doran Stabler, MD;  Location: [facility];  Service: Gastroenterology;  Laterality: N/A;   ENTEROSCOPY N/A 07/08/2020   Procedure: ENTEROSCOPY;  Surgeon: Milus Banister, MD;  Location: [facility];  Service: Endoscopy;  Laterality: N/A;   ESOPHAGOGASTRODUODENOSCOPY  05/05/2011   Procedure: ESOPHAGOGASTRODUODENOSCOPY (EGD);  Surgeon: Zenovia Jarred, MD;  Location: [facility];  Service: Gastroenterology;  Laterality: N/A;  Dr. Hilarie Fredrickson will do procedure for Dr. Benson Norway Saturday.   ESOPHAGOGASTRODUODENOSCOPY  05/08/2011   Procedure:  ESOPHAGOGASTRODUODENOSCOPY (EGD);  Surgeon: Beryle Beams;  [facility]: [facility];  Service: Endoscopy;  Laterality: N/A;   ESOPHAGOGASTRODUODENOSCOPY  06/07/2011   Procedure: ESOPHAGOGASTRODUODENOSCOPY (EGD);  Surgeon: Beryle Beams, MD;  Location: [facility];  Service: Endoscopy;  Laterality: N/A;   ESOPHAGOGASTRODUODENOSCOPY  12/20/2011   Procedure: ESOPHAGOGASTRODUODENOSCOPY (EGD);  Surgeon: Beryle Beams, MD;  Location: [facility];  Service: Endoscopy;  Laterality: N/A;   EYE SURGERY Bilateral    cataracts   FLEXIBLE SIGMOIDOSCOPY  12/21/2011   Procedure: FLEXIBLE SIGMOIDOSCOPY;  Surgeon: Beryle Beams, MD;  [facility];  Service: Endoscopy;  Laterality: N/A;   HOT HEMOSTASIS  06/07/2011   Procedure: HOT HEMOSTASIS (ARGON PLASMA COAGULATION/BICAP);  Surgeon: Beryle Beams, MD;  Location: [facility];  Service: Endoscopy;  Laterality: N/A;   HOT HEMOSTASIS N/A 07/08/2020   Procedure: HOT HEMOSTASIS (ARGON PLASMA COAGULATION/BICAP);  Surgeon: Milus Banister, MD;  Location: [facility];  Service: Endoscopy;  Laterality: N/A;   INCISIONAL HERNIA REPAIR N/A 06/01/2014   Procedure: ATTEMPTED LAPAROSCOPIC AND OPEN INCISIONAL HERNIA REPAIR WITH MESH;  Surgeon: Coralie Keens, MD;  Location: [facility];  Service: General;  Laterality: N/A;   INSERTION OF MESH N/A  06/01/2014   Procedure: INSERTION OF MESH;  Surgeon: Coralie Keens, MD;  Location: [facility];  Service: General;  Laterality: N/A;   LAPAROTOMY  02/16/2012   Procedure: EXPLORATORY LAPAROTOMY;  Surgeon: Edward Jolly, MD;  Location: [facility];  Service: General;  Laterality: N/A;  oversewing of anastomotic leak and rigid sigmoidoscopy   LAPAROTOMY N/A 06/21/2014   Procedure: ABDOMINAL WOUND EXPLORATION;  Surgeon: Coralie Keens, MD;  Location: [facility];  Service: General;  Laterality: N/A;   LEG AMPUTATION ABOVE KNEE     left   PARTIAL COLECTOMY  02/15/2012   Procedure: PARTIAL COLECTOMY;  Surgeon: Harl Bowie, MD;   Location: [facility];  Service: General;  Laterality: N/A;   SUBMUCOSAL INJECTION  07/08/2020   Procedure: SUBMUCOSAL INJECTION;  Surgeon: Milus Banister, MD;  Location: [facility];  Service: Endoscopy;;   TONSILLECTOMY     WRIST FUSION Right 04/27/2021   Procedure: SCAPHOID EXCISION  WITH 4 BONE FUSION RIGHT WRIST;  Surgeon: Leanora Cover, MD;  Location: [facility];  Service: Orthopedics;  Laterality: Right;    I have reviewed the social history and family history with the patient and they are unchanged from previous note.  ALLERGIES:  is allergic to ciprofloxacin, morphine and related, penicillins, codeine, lisinopril, and feraheme [ferumoxytol].  MEDICATIONS:  Current Outpatient Medications  Medication Sig Dispense Refill   ACCU-CHEK FASTCLIX LANCETS MISC Use to test blood glucose 3 times daily. 100 each 11   amLODipine (NORVASC) 10 MG tablet Take 1 tablet (10 mg total) by mouth daily. 90 tablet 1   Blood Glucose Monitoring Suppl (ACCU-CHEK GUIDE) w/Device KIT 1 each by Does not apply route 3 (three) times daily. The patient is insulin requiring, ICD 10 code E11.65. The patient tests 3 times a day. 1 kit 1   calcium-vitamin D (OSCAL WITH D) 500-200 MG-UNIT tablet Take 1 tablet by mouth 2 (two) times daily. 60 tablet 0   carvedilol (COREG) 6.25 MG tablet Take 1 tablet (6.25 mg total) by mouth 2 (two) times daily. 60 tablet 1   colchicine 0.6 MG tablet Take 0.6 mg by mouth daily as needed.     Dulaglutide (TRULICITY) 3.79 KW/4.0XB SOPN INJECT 0.75 MG INTO THE SKIN ONCE A WEEK. 6 mL 3   ergocalciferol (DRISDOL) 1.25 MG (50000 UT) capsule Take 1 capsule by mouth weekly for 4 weeks then 1 capsule monthly for 4 months 8 capsule 3   famotidine (PEPCID) 20 MG tablet Take 1 tablet (20 mg total) by mouth 2 (two) times daily. 60 tablet 1   gabapentin (NEURONTIN) 400 MG capsule TAKE 1 CAPSULE BY MOUTH EVERY MORNING AND THEN TAKE 1 CAPSULE IN THE AFTERNOON; THEN 2 CAPSULES BY MOUTH BEFORE  BEDTIME 120 capsule 5   glucose blood (ACCU-CHEK GUIDE) test strip Use as instructed 100 strip 3   losartan (COZAAR) 100 MG tablet Take 1 tablet (100 mg total) by mouth daily. 90 tablet 1   magnesium oxide (MAG-OX) 400 (240 Mg) MG tablet Take 1 tablet (400 mg total) by mouth daily. 30 tablet 5   metFORMIN (GLUCOPHAGE) 1000 MG tablet Take 1 tablet (1,000 mg total) by mouth daily with breakfast. 90 tablet 0   omeprazole (PRILOSEC) 20 MG capsule Take 1 capsule (20 mg total) by mouth daily. 30 capsule 2   ondansetron (ZOFRAN) 4 MG tablet Take 1 tablet (4 mg total) by mouth every 8 (eight) hours as needed for nausea or vomiting. 20 tablet 0   rosuvastatin (CRESTOR) 20  MG tablet Take 1 tablet (20 mg total) by mouth daily. 90 tablet 1   spironolactone (ALDACTONE) 25 MG tablet Take 1 tablet (25 mg total) by mouth daily. 30 tablet 2   No current facility-administered medications for this visit.    PHYSICAL EXAMINATION: ECOG PERFORMANCE STATUS: 1   There were no vitals filed for this visit. Wt Readings from Last 3 Encounters:  03/20/22 152 lb 3.2 oz (69 kg)  03/07/22 147 lb 3.2 oz (66.8 kg)  03/06/22 150 lb 12.8 oz (68.4 kg)     GENERAL:alert, no distress and comfortable SKIN: skin color, texture, turgor are normal, no rashes or significant lesions EYES: normal, Conjunctiva are pink and non-injected, sclera clear Musculoskeletal:no cyanosis of digits and no clubbing  NEURO: alert & oriented x 3 with fluent speech, no focal motor/sensory deficits  LABORATORY DATA:  I have reviewed the data as listed    Latest Ref Rng & Units 04/16/2022    8:29 AM 04/03/2022    9:36 AM 03/21/2022    9:05 AM  CBC  WBC 4.0 - 10.5 K/uL 7.9  7.1  6.3   Hemoglobin 12.0 - 15.0 g/dL 9.9  9.5  9.2   Hematocrit 36.0 - 46.0 % 31.6  30.3  29.8   Platelets 150 - 400 K/uL 266  304  354         Latest Ref Rng & Units 04/16/2022    8:29 AM 03/20/2022    9:55 AM 11/21/2021    9:40 AM  CMP  Glucose 70 - 99 mg/dL  244  91  108   BUN 8 - 23 mg/dL _0 Creatinine 0.44 - 1.00 mg/dL 1.56  1.37  1.25   Sodium 135 - 145 mmol/L 141  141  138   Potassium 3.5 - 5.1 mmol/L 3.7  4.7  3.4   Chloride 98 - 111 mmol/L 107  107  105   CO2 22 - 32 mmol/L _1 Calcium 8.9 - 10.3 mg/dL 8.0  8.6  9.0   Total Protein 6.5 - 8.1 g/dL 7.0   6.4   Total Bilirubin 0.3 - 1.2 mg/dL 0.2   0.8   Alkaline Phos 38 - 126 U/L 101   73   AST 15 - 41 U/L 27   19   ALT 0 - 44 U/L 19   17       RADIOGRAPHIC STUDIES: I have personally reviewed the radiological images as listed and agreed with the findings in the report. No results found.    Orders Placed This Encounter  Procedures   Rapid urine drug screen (hospital performed)   All questions were answered. The patient knows to call the clinic with any problems, questions or concerns. No barriers to learning was detected. The total time spent in the appointment was 30 minutes.     Truitt Merle, MD 04/16/2022   I, Wilburn Mylar, am acting as scribe for Truitt Merle, MD.   I have reviewed the above documentation for accuracy and completeness, and I agree with the above.

## 2022-04-16 NOTE — Progress Notes (Signed)
Avastin treatment held today due to urine drug screen results per Dr. Burr Medico.  Patient observed during 30 minute post observation without incident. VSS at discharge.

## 2022-04-16 NOTE — Patient Instructions (Signed)
Ferumoxytol Injection What is this medication? FERUMOXYTOL (FER ue MOX i tol) treats low levels of iron in your body (iron deficiency anemia). Iron is a mineral that plays an important role in making red blood cells, which carry oxygen from your lungs to the rest of your body. This medicine may be used for other purposes; ask your health care provider or pharmacist if you have questions. COMMON BRAND NAME(S): Feraheme What should I tell my care team before I take this medication? They need to know if you have any of these conditions: Anemia not caused by low iron levels High levels of iron in the blood Magnetic resonance imaging (MRI) test scheduled An unusual or allergic reaction to iron, other medications, foods, dyes, or preservatives Pregnant or trying to get pregnant Breastfeeding How should I use this medication? This medication is injected into a vein. It is given by your care team in a hospital or clinic setting. Talk to your care team the use of this medication in children. Special care may be needed. Overdosage: If you think you have taken too much of this medicine contact a poison control center or emergency room at once. NOTE: This medicine is only for you. Do not share this medicine with others. What if I miss a dose? It is important not to miss your dose. Call your care team if you are unable to keep an appointment. What may interact with this medication? Other iron products This list may not describe all possible interactions. Give your health care provider a list of all the medicines, herbs, non-prescription drugs, or dietary supplements you use. Also tell them if you smoke, drink alcohol, or use illegal drugs. Some items may interact with your medicine. What should I watch for while using this medication? Visit your care team regularly. Tell your care team if your symptoms do not start to get better or if they get worse. You may need blood work done while you are taking this  medication. You may need to follow a special diet. Talk to your care team. Foods that contain iron include: whole grains/cereals, dried fruits, beans, or peas, leafy green vegetables, and organ meats (liver, kidney). What side effects may I notice from receiving this medication? Side effects that you should report to your care team as soon as possible: Allergic reactions--skin rash, itching, hives, swelling of the face, lips, tongue, or throat Low blood pressure--dizziness, feeling faint or lightheaded, blurry vision Shortness of breath Side effects that usually do not require medical attention (report to your care team if they continue or are bothersome): Flushing Headache Joint pain Muscle pain Nausea Pain, redness, or irritation at injection site This list may not describe all possible side effects. Call your doctor for medical advice about side effects. You may report side effects to FDA at 1-800-FDA-1088. Where should I keep my medication? This medication is given in a hospital or clinic and will not be stored at home. NOTE: This sheet is a summary. It may not cover all possible information. If you have questions about this medicine, talk to your doctor, pharmacist, or health care provider.  2023 Elsevier/Gold Standard (2020-10-05 00:00:00) [facility]  Discharge Instructions: Thank you for choosing [facility] to provide your oncology and hematology care.   If you have a lab appointment with the [facility], please go directly to the [facility] and check in at the registration area.   Wear comfortable clothing and clothing appropriate for easy  access to any Portacath or PICC line.   We strive to give you quality time with your provider. You may need to reschedule your appointment if you arrive late (15 or more minutes).  Arriving late affects you and other patients whose appointments are after yours.  Also, if you miss three or more  appointments without notifying the office, you may be dismissed from the clinic at the provider's discretion.      For prescription refill requests, have your pharmacy contact our office and allow 72 hours for refills to be completed.    Today you received the following chemotherapy and/or immunotherapy agents: Bevacizumab      To help prevent nausea and vomiting after your treatment, we encourage you to take your nausea medication as directed.  BELOW ARE SYMPTOMS THAT SHOULD BE REPORTED IMMEDIATELY: *FEVER GREATER THAN 100.4 F (38 C) OR HIGHER *CHILLS OR SWEATING *NAUSEA AND VOMITING THAT IS NOT CONTROLLED WITH YOUR NAUSEA MEDICATION *UNUSUAL SHORTNESS OF BREATH *UNUSUAL BRUISING OR BLEEDING *URINARY PROBLEMS (pain or burning when urinating, or frequent urination) *BOWEL PROBLEMS (unusual diarrhea, constipation, pain near the anus) TENDERNESS IN MOUTH AND THROAT WITH OR WITHOUT PRESENCE OF ULCERS (sore throat, sores in mouth, or a toothache) UNUSUAL RASH, SWELLING OR PAIN  UNUSUAL VAGINAL DISCHARGE OR ITCHING   Items with * indicate a potential emergency and should be followed up as soon as possible or go to the Emergency Department if any problems should occur.  Please show the CHEMOTHERAPY ALERT CARD or IMMUNOTHERAPY ALERT CARD at check-in to the Emergency Department and triage nurse.  Should you have questions after your visit or need to cancel or reschedule your appointment, please contact [facility]  Dept: [phone]  and follow the prompts.  Office hours are 8:00 a.m. to 4:30 p.m. Monday - Friday. Please note that voicemails left after 4:00 p.m. may not be returned until the following business day.  We are closed weekends and major holidays. You have access to a nurse at all times for urgent questions. Please call the main number to the clinic Dept: [phone] and follow the prompts.   For any non-urgent questions, you may also contact your  provider using MyChart. We now offer e-Visits for anyone 22 and older to request care online for non-urgent symptoms. For details visit [email or web address].   Also download the MyChart app! Go to the app store, search "MyChart", open the app, select [facility], and log in with your MyChart username and password.  Masks are optional in the cancer centers. If you would like for your care team to wear a mask while they are taking care of you, please let them know. For doctor visits, patients may have with them one support person who is at least 69 years old. At this time, visitors are not allowed in the infusion area.

## 2022-04-16 NOTE — Progress Notes (Signed)
[facility] CSW Progress Note  Clinical Education officer, museum contacted patient by phone to offer supportive services.  Pt unable to receive infusion today due to a positive toxicology screen.  Pt declined support at this time; however, was receptive to receiving contact details for CSW should she wish to connect to supportive services at a later time.  CSW to remain available as appropriate.      Henriette Combs, LCSW    Patient is participating in a Managed Medicaid Plan:  Yes

## 2022-04-17 ENCOUNTER — Encounter: Payer: 59 | Admitting: Internal Medicine

## 2022-04-24 ENCOUNTER — Other Ambulatory Visit (HOSPITAL_COMMUNITY): Payer: Self-pay

## 2022-04-24 MED ORDER — FAMOTIDINE 20 MG PO TABS
20.0000 mg | ORAL_TABLET | Freq: Two times a day (BID) | ORAL | 1 refills | Status: DC
  Filled 2022-04-24: qty 60, 30d supply, fill #0

## 2022-04-25 ENCOUNTER — Other Ambulatory Visit (HOSPITAL_COMMUNITY): Payer: Self-pay

## 2022-05-01 ENCOUNTER — Other Ambulatory Visit: Payer: Self-pay

## 2022-05-01 ENCOUNTER — Other Ambulatory Visit (HOSPITAL_COMMUNITY): Payer: Self-pay

## 2022-05-01 ENCOUNTER — Ambulatory Visit
Admission: RE | Admit: 2022-05-01 | Discharge: 2022-05-01 | Disposition: A | Discharge status: Home / Self Care | Payer: 59 | Source: Ambulatory Visit | Attending: Student in an Organized Health Care Education/Training Program | Admitting: Student in an Organized Health Care Education/Training Program

## 2022-05-01 DIAGNOSIS — Z0000 Encounter for general adult medical examination without abnormal findings: Secondary | ICD-10-CM

## 2022-05-02 ENCOUNTER — Inpatient Hospital Stay: Payer: 59 | Attending: Hematology

## 2022-05-02 ENCOUNTER — Other Ambulatory Visit: Payer: Self-pay

## 2022-05-02 ENCOUNTER — Inpatient Hospital Stay: Payer: 59

## 2022-05-02 DIAGNOSIS — K922 Gastrointestinal hemorrhage, unspecified: Secondary | ICD-10-CM

## 2022-05-02 DIAGNOSIS — I129 Hypertensive chronic kidney disease with stage 1 through stage 4 chronic kidney disease, or unspecified chronic kidney disease: Secondary | ICD-10-CM | POA: Insufficient documentation

## 2022-05-02 DIAGNOSIS — N189 Chronic kidney disease, unspecified: Secondary | ICD-10-CM | POA: Insufficient documentation

## 2022-05-02 DIAGNOSIS — Z79899 Other long term (current) drug therapy: Secondary | ICD-10-CM | POA: Insufficient documentation

## 2022-05-02 DIAGNOSIS — F1290 Cannabis use, unspecified, uncomplicated: Secondary | ICD-10-CM | POA: Insufficient documentation

## 2022-05-02 DIAGNOSIS — C19 Malignant neoplasm of rectosigmoid junction: Secondary | ICD-10-CM

## 2022-05-02 DIAGNOSIS — E1122 Type 2 diabetes mellitus with diabetic chronic kidney disease: Secondary | ICD-10-CM | POA: Insufficient documentation

## 2022-05-02 DIAGNOSIS — D500 Iron deficiency anemia secondary to blood loss (chronic): Secondary | ICD-10-CM

## 2022-05-02 DIAGNOSIS — F1410 Cocaine abuse, uncomplicated: Secondary | ICD-10-CM | POA: Diagnosis not present

## 2022-05-02 LAB — RAPID URINE DRUG SCREEN, HOSP PERFORMED
Amphetamines: NOT DETECTED
Barbiturates: NOT DETECTED
Benzodiazepines: NOT DETECTED
Cocaine: POSITIVE — AB
Opiates: NOT DETECTED
Tetrahydrocannabinol: POSITIVE — AB

## 2022-05-02 LAB — CBC WITH DIFFERENTIAL (CANCER CENTER ONLY)
Abs Immature Granulocytes: 0.05 10*3/uL (ref 0.00–0.07)
Basophils Absolute: 0 10*3/uL (ref 0.0–0.1)
Basophils Relative: 1 %
Eosinophils Absolute: 0.1 10*3/uL (ref 0.0–0.5)
Eosinophils Relative: 2 %
HCT: 38.2 % (ref 36.0–46.0)
Hemoglobin: 12.2 g/dL (ref 12.0–15.0)
Immature Granulocytes: 1 %
Lymphocytes Relative: 15 %
Lymphs Abs: 1.1 10*3/uL (ref 0.7–4.0)
MCH: 28.5 pg (ref 26.0–34.0)
MCHC: 31.9 g/dL (ref 30.0–36.0)
MCV: 89.3 fL (ref 80.0–100.0)
Monocytes Absolute: 1 10*3/uL (ref 0.1–1.0)
Monocytes Relative: 13 %
Neutro Abs: 5.3 10*3/uL (ref 1.7–7.7)
Neutrophils Relative %: 68 %
Platelet Count: 262 10*3/uL (ref 150–400)
RBC: 4.28 MIL/uL (ref 3.87–5.11)
RDW: 18.6 % — ABNORMAL HIGH (ref 11.5–15.5)
WBC Count: 7.7 10*3/uL (ref 4.0–10.5)
nRBC: 0 % (ref 0.0–0.2)

## 2022-05-02 LAB — TOTAL PROTEIN, URINE DIPSTICK: Protein, ur: >2000 mg/dL — AB

## 2022-05-02 LAB — IRON AND IRON BINDING CAPACITY (CC-WL,HP ONLY)
Iron: 50 ug/dL (ref 28–170)
Saturation Ratios: 16 % (ref 10.4–31.8)
TIBC: 316 ug/dL (ref 250–450)
UIBC: 266 ug/dL (ref 148–442)

## 2022-05-02 LAB — FERRITIN: Ferritin: 182 ng/mL (ref 11–307)

## 2022-05-02 NOTE — Progress Notes (Signed)
Per Dr Burr Medico, tx will be held today due to urine protein >2000.  Patient informed, instructed to check her BP daily & to see her PCP if it remains elevated.  Patient states she has [place] caregiver who checks this for her.  Informed patient scheduling will contact her for appointment in January, she verbalizes understanding.

## 2022-05-08 ENCOUNTER — Other Ambulatory Visit: Payer: Self-pay

## 2022-05-08 ENCOUNTER — Other Ambulatory Visit: Payer: Self-pay | Admitting: Student

## 2022-05-08 ENCOUNTER — Other Ambulatory Visit (HOSPITAL_COMMUNITY): Payer: Self-pay

## 2022-05-08 NOTE — Telephone Encounter (Signed)
Refill request on gabapentin (NEURONTIN) 400 MG capsule  @ [facility].

## 2022-05-11 ENCOUNTER — Other Ambulatory Visit (HOSPITAL_COMMUNITY): Payer: Self-pay

## 2022-05-11 ENCOUNTER — Other Ambulatory Visit: Payer: Self-pay

## 2022-05-14 ENCOUNTER — Other Ambulatory Visit: Payer: Self-pay

## 2022-05-15 ENCOUNTER — Other Ambulatory Visit (HOSPITAL_COMMUNITY): Payer: Self-pay

## 2022-05-15 MED ORDER — METFORMIN HCL 1000 MG PO TABS
1000.0000 mg | ORAL_TABLET | Freq: Every day | ORAL | 0 refills | Status: DC
  Filled 2022-05-15: qty 90, 90d supply, fill #0

## 2022-05-15 MED ORDER — GABAPENTIN 400 MG PO CAPS
ORAL_CAPSULE | ORAL | 3 refills | Status: DC
  Filled 2022-05-15: qty 360, 90d supply, fill #0
  Filled 2022-08-21: qty 360, 90d supply, fill #1
  Filled 2022-11-22: qty 360, 90d supply, fill #2
  Filled 2023-02-18: qty 360, 90d supply, fill #3

## 2022-05-16 ENCOUNTER — Inpatient Hospital Stay: Payer: 59

## 2022-05-16 ENCOUNTER — Other Ambulatory Visit (HOSPITAL_COMMUNITY): Payer: Self-pay

## 2022-05-16 ENCOUNTER — Inpatient Hospital Stay: Payer: 59 | Admitting: Nurse Practitioner

## 2022-05-17 ENCOUNTER — Other Ambulatory Visit (HOSPITAL_COMMUNITY): Payer: Self-pay

## 2022-05-30 ENCOUNTER — Other Ambulatory Visit: Payer: 59

## 2022-05-31 ENCOUNTER — Inpatient Hospital Stay: Payer: 59

## 2022-05-31 ENCOUNTER — Inpatient Hospital Stay: Payer: 59 | Attending: Hematology

## 2022-05-31 ENCOUNTER — Telehealth: Payer: Self-pay | Admitting: Hematology

## 2022-05-31 ENCOUNTER — Inpatient Hospital Stay: Payer: 59 | Admitting: Nurse Practitioner

## 2022-05-31 NOTE — Progress Notes (Deleted)
Patient Care Team: Lyndle Herrlich, MD as PCP - General Ernesto Rutherford, MD as Consulting Physician (Ophthalmology) Abigail Miyamoto, MD as Consulting Physician (General Surgery) Malachy Mood, MD as Consulting Physician (Hematology) Comer, Belia Heman, MD as Consulting Physician (Infectious Diseases) Danis, Andreas Blower, MD as Consulting Physician (Gastroenterology)   CHIEF COMPLAINT: Follow-up iron deficiency anemia  CURRENT THERAPY:  Bevacizumab, intermittently given proteinuria   IV Feraheme PRN, if ferritin < 30  INTERVAL HISTORY Ms. Veracruz returns for follow-up and treatment as scheduled, last seen by Dr. Demetra Shiner 04/16/2022 and received IV feraheme x2. Last Beva 04/03/22.   ROS   Past Medical History:  Diagnosis Date   Allergic rhinitis    Anemia, iron deficiency 05/03/2011   2/2 GI AVMs - recieves iron infusions as well as periodic red blood cell transfusions   Anxiety    Arteriovenous malformation of duodenum 02/25/2015   CARPAL TUNNEL RELEASE, HX OF 07/22/2008   Chest pain 10/28/2019   Colon polyps    Diabetes mellitus without complication (HCC)    type 2   Difficulty sleeping    takes trazadone for sleep   Diverticulosis    Electrolyte abnormality 01/25/2021   Fall 01/03/2020   GERD (gastroesophageal reflux disease)    GI bleed 07/07/2020   Hepatitis C    in SVR with Harvoni 12/2014-02/2015.    History of hernia repair 08/18/2012   History of porphyria 11/30/2014   Porphyria cutanea tarda in setting of chronic Hepatitis C, frequent  IV iron infusions, and alcohol abuse    Hypertension    Hypocalcemia 10/28/2019   Hypocalcemia 01/19/2021   Hypokalemia 09/27/2014   Hypokalemia 01/19/2021   Hypomagnesemia 01/19/2021   Kidney disease 02/04/2019   Peripheral vascular disease (HCC)    Prolonged QT interval 01/19/2021   Stroke (HCC) 2010   no residual deficits   Substance use disorder    cocaine   Tachycardia 07/10/2017   Trichomonas vaginitis 04/16/2019     Past Surgical  History:  Procedure Laterality Date   ABDOMINAL HYSTERECTOMY     APPLICATION OF WOUND VAC N/A 06/21/2014   Procedure: APPLICATION OF WOUND VAC;  Surgeon: Abigail Miyamoto, MD;  Location: [facility] OR;  Service: General;  Laterality: N/A;   CARPAL TUNNEL RELEASE     rt hand   COLONOSCOPY WITH PROPOFOL N/A 03/17/2018   Procedure: COLONOSCOPY WITH PROPOFOL;  Surgeon: Sherrilyn Rist, MD;  Location: [facility];  Service: Gastroenterology;  Laterality: N/A;   ENTEROSCOPY N/A 12/04/2012   Procedure: ENTEROSCOPY;  Surgeon: Theda Belfast, MD;  [facility];  Service: Endoscopy;  Laterality: N/A;   ENTEROSCOPY N/A 12/18/2012   Procedure: ENTEROSCOPY;  Surgeon: Theda Belfast, MD;  [facility];  Service: Endoscopy;  Laterality: N/A;   ENTEROSCOPY N/A 02/25/2015   Procedure: ENTEROSCOPY;  Surgeon: Louis Meckel, MD;  Location: [facility] ENDOSCOPY;  Service: Endoscopy;  Laterality: N/A;   ENTEROSCOPY N/A 10/29/2017   Procedure: ENTEROSCOPY;  Surgeon: Sherrilyn Rist, MD;  [facility];  Service: Gastroenterology;  Laterality: N/A;   ENTEROSCOPY N/A 07/08/2020   Procedure: ENTEROSCOPY;  Surgeon: Rachael Fee, MD;  [facility];  Service: Endoscopy;  Laterality: N/A;   ESOPHAGOGASTRODUODENOSCOPY  05/05/2011   Procedure: ESOPHAGOGASTRODUODENOSCOPY (EGD);  Surgeon: Erick Blinks, MD;  Location: [facility];  Service: Gastroenterology;  Laterality: N/A;  Dr. Rhea Belton will do procedure for Dr. [name] Saturday.   ESOPHAGOGASTRODUODENOSCOPY  05/08/2011   Procedure: ESOPHAGOGASTRODUODENOSCOPY (EGD);  Surgeon: Theda Belfast;  [facility];  Service: Endoscopy;  Laterality: N/A;   ESOPHAGOGASTRODUODENOSCOPY  06/07/2011   Procedure: ESOPHAGOGASTRODUODENOSCOPY (EGD);  Surgeon: Beryle Beams, MD;  Location: [facility];  Service: Endoscopy;  Laterality: N/A;   ESOPHAGOGASTRODUODENOSCOPY  12/20/2011   Procedure: ESOPHAGOGASTRODUODENOSCOPY (EGD);  Surgeon: Beryle Beams, MD;   Location: [facility];  Service: Endoscopy;  Laterality: N/A;   EYE SURGERY Bilateral    cataracts   FLEXIBLE SIGMOIDOSCOPY  12/21/2011   Procedure: FLEXIBLE SIGMOIDOSCOPY;  Surgeon: Beryle Beams, MD;  [facility];  Service: Endoscopy;  Laterality: N/A;   HOT HEMOSTASIS  06/07/2011   Procedure: HOT HEMOSTASIS (ARGON PLASMA COAGULATION/BICAP);  Surgeon: Beryle Beams, MD;  Location: [facility];  Service: Endoscopy;  Laterality: N/A;   HOT HEMOSTASIS N/A 07/08/2020   Procedure: HOT HEMOSTASIS (ARGON PLASMA COAGULATION/BICAP);  Surgeon: Milus Banister, MD;  Location: [facility];  Service: Endoscopy;  Laterality: N/A;   INCISIONAL HERNIA REPAIR N/A 06/01/2014   Procedure: ATTEMPTED LAPAROSCOPIC AND OPEN INCISIONAL HERNIA REPAIR WITH MESH;  Surgeon: Coralie Keens, MD;  Location: [facility];  Service: General;  Laterality: N/A;   INSERTION OF MESH N/A 06/01/2014   Procedure: INSERTION OF MESH;  Surgeon: Coralie Keens, MD;  Location: [facility];  Service: General;  Laterality: N/A;   LAPAROTOMY  02/16/2012   Procedure: EXPLORATORY LAPAROTOMY;  Surgeon: Edward Jolly, MD;  [facility];  Service: General;  Laterality: N/A;  oversewing of anastomotic leak and rigid sigmoidoscopy   LAPAROTOMY N/A 06/21/2014   Procedure: ABDOMINAL WOUND EXPLORATION;  Surgeon: Coralie Keens, MD;  Location: [facility];  Service: General;  Laterality: N/A;   LEG AMPUTATION ABOVE KNEE     left   PARTIAL COLECTOMY  02/15/2012   Procedure: PARTIAL COLECTOMY;  Surgeon: Harl Bowie, MD;  [facility];  Service: General;  Laterality: N/A;   SUBMUCOSAL INJECTION  07/08/2020   Procedure: SUBMUCOSAL INJECTION;  Surgeon: Milus Banister, MD;  [facility];  Service: Endoscopy;;   TONSILLECTOMY     WRIST FUSION Right 04/27/2021   Procedure: SCAPHOID EXCISION  WITH 4 BONE FUSION RIGHT WRIST;  Surgeon: Leanora Cover, MD;  Location: [facility];  Service: Orthopedics;  Laterality: Right;      Outpatient Encounter Medications as of 05/31/2022  Medication Sig   ACCU-CHEK FASTCLIX LANCETS MISC Use to test blood glucose 3 times daily.   amLODipine (NORVASC) 10 MG tablet Take 1 tablet (10 mg total) by mouth daily.   Blood Glucose Monitoring Suppl (ACCU-CHEK GUIDE) w/Device KIT 1 each by Does not apply route 3 (three) times daily. The patient is insulin requiring, ICD 10 code E11.65. The patient tests 3 times a day.   calcium-vitamin D (OSCAL WITH D) 500-200 MG-UNIT tablet Take 1 tablet by mouth 2 (two) times daily.   carvedilol (COREG) 6.25 MG tablet Take 1 tablet (6.25 mg total) by mouth 2 (two) times daily.   colchicine 0.6 MG tablet Take 0.6 mg by mouth daily as needed.   Dulaglutide (TRULICITY) 6.50 PT/4.6FK SOPN INJECT 0.75 MG INTO THE SKIN ONCE A WEEK.   ergocalciferol (DRISDOL) 1.25 MG (50000 UT) capsule Take 1 capsule by mouth weekly for 4 weeks then 1 capsule monthly for 4 months   famotidine (PEPCID) 20 MG tablet Take 1 tablet (20 mg total) by mouth 2 (two) times daily.   gabapentin (NEURONTIN) 400 MG capsule Take 1 capsule (400 mg total) by mouth in the morning AND 1 capsule (400 mg total) daily  in the afternoon AND 2 capsules (800 mg total) before bedtime.   glucose blood (ACCU-CHEK GUIDE) test strip Use as instructed   losartan (COZAAR) 100 MG tablet Take 1 tablet (100 mg total) by mouth daily.   magnesium oxide (MAG-OX) 400 (240 Mg) MG tablet Take 1 tablet (400 mg total) by mouth daily.   metFORMIN (GLUCOPHAGE) 1000 MG tablet Take 1 tablet (1,000 mg total) by mouth daily with breakfast.   omeprazole (PRILOSEC) 20 MG capsule Take 1 capsule (20 mg total) by mouth daily.   ondansetron (ZOFRAN) 4 MG tablet Take 1 tablet (4 mg total) by mouth every 8 (eight) hours as needed for nausea or vomiting.   rosuvastatin (CRESTOR) 20 MG tablet Take 1 tablet (20 mg total) by mouth daily.   spironolactone (ALDACTONE) 25 MG tablet Take 1 tablet (25 mg total) by mouth daily.   No  facility-administered encounter medications on file as of 05/31/2022.     There were no vitals filed for this visit. There is no height or weight on file to calculate BMI.   PHYSICAL EXAM GENERAL:alert, no distress and comfortable SKIN: no rash  EYES: sclera clear NECK: without mass LYMPH:  no palpable cervical or supraclavicular lymphadenopathy  LUNGS: clear with normal breathing effort HEART: regular rate & rhythm, no lower extremity edema ABDOMEN: abdomen soft, non-tender and normal bowel sounds NEURO: alert & oriented x 3 with fluent speech, no focal motor/sensory deficits Breast exam:  PAC without erythema    CBC    Component Value Date/Time   WBC 7.7 05/02/2022 0912   WBC 8.7 11/21/2021 0940   RBC 4.28 05/02/2022 0912   HGB 12.2 05/02/2022 0912   HGB 8.0 (L) 07/10/2017 1639   HGB 11.3 (L) 01/31/2017 1416   HCT 38.2 05/02/2022 0912   HCT 33.8 (L) 08/10/2020 0753   HCT 33.9 (L) 01/31/2017 1416   PLT 262 05/02/2022 0912   PLT 450 (H) 07/10/2017 1639   MCV 89.3 05/02/2022 0912   MCV 91 07/10/2017 1639   MCV 97.3 01/31/2017 1416   MCH 28.5 05/02/2022 0912   MCHC 31.9 05/02/2022 0912   RDW 18.6 (H) 05/02/2022 0912   RDW 18.9 (H) 07/10/2017 1639   RDW 15.2 (H) 01/31/2017 1416   LYMPHSABS 1.1 05/02/2022 0912   LYMPHSABS 1.8 01/31/2017 1416   MONOABS 1.0 05/02/2022 0912   MONOABS 0.9 01/31/2017 1416   EOSABS 0.1 05/02/2022 0912   EOSABS 0.2 01/31/2017 1416   BASOSABS 0.0 05/02/2022 0912   BASOSABS 0.0 01/31/2017 1416     CMP     Component Value Date/Time   NA 141 04/16/2022 0829   NA 141 03/20/2022 0955   NA 141 01/31/2017 1416   K 3.7 04/16/2022 0829   K 4.4 01/31/2017 1416   CL 107 04/16/2022 0829   CL 104 10/03/2012 1048   CO2 26 04/16/2022 0829   CO2 22 01/31/2017 1416   GLUCOSE 244 (H) 04/16/2022 0829   GLUCOSE 104 01/31/2017 1416   GLUCOSE 154 (H) 10/03/2012 1048   BUN 23 04/16/2022 0829   BUN 18 03/20/2022 0955   BUN 14.0 01/31/2017 1416    CREATININE 1.56 (H) 04/16/2022 0829   CREATININE 0.9 01/31/2017 1416   CALCIUM 8.0 (L) 04/16/2022 0829   CALCIUM 6.6 (LL) 01/19/2021 0906   CALCIUM 9.0 01/31/2017 1416   PROT 7.0 04/16/2022 0829   PROT 6.9 10/19/2020 1624   PROT 7.1 01/31/2017 1416   ALBUMIN 3.9 04/16/2022 0829   ALBUMIN 4.2 10/19/2020  1624   ALBUMIN 3.4 (L) 01/31/2017 1416   AST 27 04/16/2022 0829   AST 25 01/31/2017 1416   ALT 19 04/16/2022 0829   ALT 21 01/31/2017 1416   ALKPHOS 101 04/16/2022 0829   ALKPHOS 71 01/31/2017 1416   BILITOT 0.2 (L) 04/16/2022 0829   BILITOT 0.26 01/31/2017 1416   GFRNONAA 36 (L) 04/16/2022 0829   GFRNONAA >89 11/30/2014 1608   GFRAA 53 (L) 02/25/2020 1021   GFRAA >89 11/30/2014 1608     ASSESSMENT & PLAN:  PLAN:  No orders of the defined types were placed in this encounter.     All questions were answered. The patient knows to call the clinic with any problems, questions or concerns. No barriers to learning were detected. I spent *** counseling the patient face to face. The total time spent in the appointment was *** and more than 50% was on counseling, review of test results, and coordination of care.   Cira Rue, NP-C $RemoveB'@DATE'ZqVwxrOf$ @

## 2022-05-31 NOTE — Telephone Encounter (Signed)
Spoke with patient confirming 1/17 appointment

## 2022-06-01 ENCOUNTER — Other Ambulatory Visit (HOSPITAL_COMMUNITY): Payer: Self-pay

## 2022-06-01 ENCOUNTER — Ambulatory Visit (INDEPENDENT_AMBULATORY_CARE_PROVIDER_SITE_OTHER): Payer: 59

## 2022-06-01 ENCOUNTER — Other Ambulatory Visit: Payer: Self-pay

## 2022-06-01 VITALS — BP 129/77 | HR 90 | Temp 98.2°F | Ht 63.0 in | Wt 153.0 lb

## 2022-06-01 DIAGNOSIS — K219 Gastro-esophageal reflux disease without esophagitis: Secondary | ICD-10-CM

## 2022-06-01 DIAGNOSIS — Z794 Long term (current) use of insulin: Secondary | ICD-10-CM

## 2022-06-01 DIAGNOSIS — S78112A Complete traumatic amputation at level between left hip and knee, initial encounter: Secondary | ICD-10-CM | POA: Diagnosis not present

## 2022-06-01 DIAGNOSIS — E559 Vitamin D deficiency, unspecified: Secondary | ICD-10-CM

## 2022-06-01 DIAGNOSIS — I10 Essential (primary) hypertension: Secondary | ICD-10-CM

## 2022-06-01 DIAGNOSIS — Z89611 Acquired absence of right leg above knee: Secondary | ICD-10-CM

## 2022-06-01 DIAGNOSIS — E1140 Type 2 diabetes mellitus with diabetic neuropathy, unspecified: Secondary | ICD-10-CM

## 2022-06-01 DIAGNOSIS — Z7689 Persons encountering health services in other specified circumstances: Secondary | ICD-10-CM

## 2022-06-01 DIAGNOSIS — E119 Type 2 diabetes mellitus without complications: Secondary | ICD-10-CM

## 2022-06-01 MED ORDER — CARVEDILOL 6.25 MG PO TABS
6.2500 mg | ORAL_TABLET | Freq: Two times a day (BID) | ORAL | 1 refills | Status: DC
  Filled 2022-06-01: qty 60, 30d supply, fill #0
  Filled 2022-06-29: qty 60, 30d supply, fill #1

## 2022-06-01 MED ORDER — TRULICITY 0.75 MG/0.5ML ~~LOC~~ SOAJ
0.7500 mg | SUBCUTANEOUS | 3 refills | Status: DC
  Filled 2022-06-01: qty 6, 84d supply, fill #0
  Filled 2022-09-03: qty 6, 84d supply, fill #1
  Filled 2022-11-22: qty 6, 84d supply, fill #2

## 2022-06-01 MED ORDER — LOSARTAN POTASSIUM 100 MG PO TABS
100.0000 mg | ORAL_TABLET | Freq: Every day | ORAL | 1 refills | Status: DC
  Filled 2022-06-01: qty 90, 90d supply, fill #0
  Filled 2022-10-31: qty 90, 90d supply, fill #1

## 2022-06-01 MED ORDER — OMEPRAZOLE 20 MG PO CPDR
20.0000 mg | DELAYED_RELEASE_CAPSULE | Freq: Every day | ORAL | 2 refills | Status: DC
  Filled 2022-06-01: qty 30, 30d supply, fill #0
  Filled 2022-06-29: qty 30, 30d supply, fill #1

## 2022-06-01 MED ORDER — AMLODIPINE BESYLATE 10 MG PO TABS
10.0000 mg | ORAL_TABLET | Freq: Every day | ORAL | 1 refills | Status: DC
  Filled 2022-06-01: qty 90, 90d supply, fill #0

## 2022-06-01 NOTE — Assessment & Plan Note (Addendum)
[name] is presenting for evaluation for power wheelchair assessment. The necessity for wheelchair is due to L AKA/R wrist surgery and corresponding limited mobility and limited ability to perform ADLs including grooming, bathing, dressing, and toileting. The patient is unable to use a cane or walker due to high fall risk and limited wrist mobility after surgery. She is unable to use a manual wheelchair or scooter due to limited wrist mobility and inability to transfer on and off scooter platform. Power wheelchair would significantly improve her independence and ability to complete ADLs. [name] has the physical and mental capacity to operate a power wheelchair safely in her home and is motivated to use the power wheelchair. PT evaluation for power wheelchair was done in 10/2021 and I agree with the findings.

## 2022-06-01 NOTE — Patient Instructions (Signed)
[name], it was a pleasure seeing you today! You endorsed feeling well today. Below are some of the things we talked about this visit. We look forward to seeing you in the follow up appointment!  Today we discussed: Wheelchair: We will fill out the paperwork and processes to get you the power wheelchair. If you need to repeat the PT evaluation, they will call you to schedule that.  Blood pressure: Your BP meds are working well. Please continue to take them.  Diabetes: I am checking your A1c today and will let you know what it is. If it looks good, you can stop taking metformin  Vitamin D: please take 1000 units of vitamin D and '600mg'$  calcium daily. You can get these supplements over the counter. I will check your vitamin D levels today.  [facility]  I have ordered the following labs today:   Lab Orders         Vitamin D (25 hydroxy)         Hemoglobin A1c         BMP8+Anion Gap       Referrals ordered today:   Referral Orders  No referral(s) requested today     I have ordered the following medication/changed the following medications:   Stop the following medications: Medications Discontinued During This Encounter  Medication Reason   Dulaglutide (TRULICITY) 5.88 TG/5.4DI SOPN Reorder   amLODipine (NORVASC) 10 MG tablet Reorder   losartan (COZAAR) 100 MG tablet Reorder   omeprazole (PRILOSEC) 20 MG capsule Reorder   carvedilol (COREG) 6.25 MG tablet Reorder     Start the following medications: Meds ordered this encounter  Medications   amLODipine (NORVASC) 10 MG tablet    Sig: Take 1 tablet (10 mg total) by mouth daily.    Dispense:  90 tablet    Refill:  1   carvedilol (COREG) 6.25 MG tablet    Sig: Take 1 tablet (6.25 mg total) by mouth 2 (two) times daily.    Dispense:  60 tablet    Refill:  1    IM program   losartan (COZAAR) 100 MG tablet    Sig: Take 1 tablet (100 mg total) by mouth daily.    Dispense:  90  tablet    Refill:  1   omeprazole (PRILOSEC) 20 MG capsule    Sig: Take 1 capsule (20 mg total) by mouth daily.    Dispense:  30 capsule    Refill:  2   Dulaglutide (TRULICITY) 2.64 BR/8.3EN SOPN    Sig: INJECT 0.75 MG INTO THE SKIN ONCE A WEEK.    Dispense:  6 mL    Refill:  3     Follow-up: 3 months   Please make sure to arrive 15 minutes prior to your next appointment. If you arrive late, you may be asked to reschedule.   We look forward to seeing you next time. Please call our clinic at [phone] if you have any questions or concerns. The best time to call is Monday-Friday from 9am-4pm, but there is someone available 24/7. If after hours or the weekend, call the main hospital number and ask for the Internal Medicine Resident On-Call. If you need medication refills, please notify your pharmacy one week in advance and they will send [place] a request.  Thank you for letting us take part in your care. Wishing you the best!  Thank you, Linus Galas MD

## 2022-06-02 ENCOUNTER — Encounter: Payer: Self-pay | Admitting: Hematology

## 2022-06-02 LAB — BMP8+ANION GAP
Anion Gap: 14 mmol/L (ref 10.0–18.0)
BUN/Creatinine Ratio: 13 (ref 12–28)
BUN: 25 mg/dL (ref 8–27)
CO2: 23 mmol/L (ref 20–29)
Calcium: 8.9 mg/dL (ref 8.7–10.3)
Chloride: 99 mmol/L (ref 96–106)
Creatinine, Ser: 1.86 mg/dL — ABNORMAL HIGH (ref 0.57–1.00)
Glucose: 117 mg/dL — ABNORMAL HIGH (ref 70–99)
Potassium: 4.6 mmol/L (ref 3.5–5.2)
Sodium: 136 mmol/L (ref 134–144)
eGFR: 29 mL/min/{1.73_m2} — ABNORMAL LOW (ref >59)

## 2022-06-02 LAB — VITAMIN D 25 HYDROXY (VIT D DEFICIENCY, FRACTURES): Vit D, 25-Hydroxy: 7.5 ng/mL — ABNORMAL LOW (ref 30.0–100.0)

## 2022-06-02 LAB — HEMOGLOBIN A1C
Est. average glucose Bld gHb Est-mCnc: 120 mg/dL
Hgb A1c MFr Bld: 5.8 % — ABNORMAL HIGH (ref 4.8–5.6)

## 2022-06-02 NOTE — Assessment & Plan Note (Signed)
Blood pressure well-managed on current regimen of amlodipine 10, Coreg 6.25 twice daily, losartan 100 mg daily.  No concerning symptoms.  Refilled all today.

## 2022-06-02 NOTE — Assessment & Plan Note (Signed)
>>  ASSESSMENT AND PLAN FOR TYPE 2 DIABETES MELLITUS WITH DIABETIC NEUROPATHY (HCC) WRITTEN ON 06/02/2022  2:37 PM BY SYNTHIA MERCHANT, MD  Currently taking metformin  1000 mg daily and Trulicity  0.75 mg weekly.  A1c has been well-controlled for a long time, was 5.8 today.  At this point, I think we can go ahead and discontinue metformin  to reduce pill burden and continue Trulicity  primarily for cardiovascular and renal protective purposes.  Will check BMP today as well.

## 2022-06-02 NOTE — Assessment & Plan Note (Addendum)
Advise daily supplementation of vitamin D/calcium given osteoporosis risk.  Will also recheck vitamin D level today as she has had previously documented deficiency to make sure she does not need stronger supplementation.

## 2022-06-02 NOTE — Progress Notes (Addendum)
CC: power wheelchair assessment  HPI:  Ms.Kenzleigh D Atwater is a 72 y.o.-year-old female with past medical history as below presenting for power wheelchair assessment.  Please see encounters tab for problem-based charting.  Past Medical History:  Diagnosis Date   Allergic rhinitis    Anemia, iron deficiency 05/03/2011   2/2 GI AVMs - recieves iron infusions as well as periodic red blood cell transfusions   Anxiety    Arteriovenous malformation of duodenum 02/25/2015   CARPAL TUNNEL RELEASE, HX OF 07/22/2008   Chest pain 10/28/2019   Colon polyps    Diabetes mellitus without complication ([place])    type 2   Difficulty sleeping    takes trazadone for sleep   Diverticulosis    Electrolyte abnormality 01/25/2021   Fall 01/03/2020   GERD (gastroesophageal reflux disease)    GI bleed 07/07/2020   Hepatitis C    in SVR with Harvoni 12/2014-02/2015.    History of hernia repair 08/18/2012   History of porphyria 11/30/2014   Porphyria cutanea tarda in setting of chronic Hepatitis C, frequent  IV iron infusions, and alcohol abuse    Hypertension    Hypocalcemia 10/28/2019   Hypocalcemia 01/19/2021   Hypokalemia 09/27/2014   Hypokalemia 01/19/2021   Hypomagnesemia 01/19/2021   Kidney disease 02/04/2019   Peripheral vascular disease ([place])    Prolonged QT interval 01/19/2021   Stroke ([place]) 2010   no residual deficits   Substance use disorder    cocaine   Tachycardia 07/10/2017   Trichomonas vaginitis 04/16/2019   Review of Systems: As in HPI.  Please see encounters tab for problem based charting.  Physical Exam:  Vitals:   06/01/22 0840  BP: 129/77  Pulse: 90  Temp: 98.2 F (36.8 C)  TempSrc: Oral  SpO2: 97%  Weight: 153 lb (69.4 kg)  Height: '5\' 3"'$  (1.6 m)   General:Well-appearing, pleasant, In NAD Cardiac: RRR, no murmurs rubs or gallops. Respiratory: Normal work of breathing on room air, CTAB Abdominal: Soft, nontender, nondistended Extremities: Left AKA with prosthesis in place.  4/5  strength at left hip, difficulty bending left knee with prosthetic.  5/5 strength right lower extremity.  4/5 strength and limited mobility of right wrist with visible surgical scar.    Assessment & Plan:   Encounter for wheelchair assessment Ms. Diliberto is presenting for evaluation for power wheelchair assessment. The necessity for wheelchair is due to L AKA/R wrist surgery and corresponding limited mobility and limited ability to perform ADLs including grooming, bathing, dressing, and toileting. The patient is unable to use a cane or walker due to high fall risk and limited wrist mobility after surgery. She is unable to use a manual wheelchair or scooter due to limited wrist mobility and inability to transfer on and off scooter platform. Power wheelchair would significantly improve her independence and ability to complete ADLs. Ms. Viverette has the physical and mental capacity to operate a power wheelchair safely in her home and is motivated to use the power wheelchair. PT evaluation for power wheelchair was done in 10/2021 and I agree with the findings.  Essential hypertension, benign Blood pressure well-managed on current regimen of amlodipine 10, Coreg 6.25 twice daily, losartan 100 mg daily.  No concerning symptoms.  Refilled all today.  Type 2 diabetes mellitus with diabetic neuropathy (HCC) Currently taking metformin 1000 mg daily and Trulicity 2.77 mg weekly.  A1c has been well-controlled for a long time, was 5.8 today.  At this point, I think we can go  ahead and discontinue metformin to reduce pill burden and continue Trulicity primarily for cardiovascular and renal protective purposes.  Will check BMP today as well.  Vitamin D deficiency Advise daily supplementation of vitamin D/calcium given osteoporosis risk.  Will also recheck vitamin D level today as she has had previously documented deficiency to make sure she does not need stronger supplementation.  Addendum:  Cr fluctuating up  again, will start SGLT2 for renal protection and refer to nephrology again. Her diabetes and hypertension are fairly well controlled and there has been concern in the past for FSGS, would want to rule out. She will be getting repeat BMP at cancer center next week, can follow with that.   Vit D also below 10, will supplement with 50000units weekly for 8 weeks and then transition back to 05-1998 units daily  Patient discussed with Dr. Heber Plymouth

## 2022-06-02 NOTE — Assessment & Plan Note (Addendum)
Currently taking metformin 1000 mg daily and Trulicity 4.59 mg weekly.  A1c has been well-controlled for a long time, was 5.8 today.  At this point, I think we can go ahead and discontinue metformin to reduce pill burden and continue Trulicity primarily for cardiovascular and renal protective purposes.  Will check BMP today as well.

## 2022-06-07 NOTE — Progress Notes (Signed)
Internal Medicine Clinic Attending  Case discussed with the resident at the time of the visit.  We reviewed the resident's history and exam and pertinent patient test results.  I agree with the assessment, diagnosis, and plan of care documented in the resident's note.  

## 2022-06-08 ENCOUNTER — Other Ambulatory Visit: Payer: Self-pay

## 2022-06-08 ENCOUNTER — Other Ambulatory Visit (HOSPITAL_COMMUNITY): Payer: Self-pay

## 2022-06-08 ENCOUNTER — Encounter: Payer: Self-pay | Admitting: Hematology

## 2022-06-08 DIAGNOSIS — N1831 Chronic kidney disease, stage 3a: Secondary | ICD-10-CM

## 2022-06-08 MED ORDER — VITAMIN D (ERGOCALCIFEROL) 1.25 MG (50000 UNIT) PO CAPS
50000.0000 [IU] | ORAL_CAPSULE | ORAL | 0 refills | Status: DC
  Filled 2022-06-08: qty 8, 56d supply, fill #0

## 2022-06-08 MED ORDER — EMPAGLIFLOZIN 10 MG PO TABS
10.0000 mg | ORAL_TABLET | Freq: Every day | ORAL | 0 refills | Status: DC
  Filled 2022-06-08: qty 30, 30d supply, fill #0

## 2022-06-11 ENCOUNTER — Telehealth: Payer: Self-pay | Admitting: Hematology

## 2022-06-11 NOTE — Telephone Encounter (Signed)
Called patient regarding upcoming 01/17 appointment, left a voicemail.

## 2022-06-12 ENCOUNTER — Other Ambulatory Visit: Payer: Self-pay

## 2022-06-12 ENCOUNTER — Other Ambulatory Visit (HOSPITAL_COMMUNITY): Payer: Self-pay

## 2022-06-13 ENCOUNTER — Other Ambulatory Visit: Payer: 59

## 2022-06-13 ENCOUNTER — Ambulatory Visit: Payer: 59 | Admitting: Physician Assistant

## 2022-06-13 ENCOUNTER — Inpatient Hospital Stay: Payer: 59

## 2022-06-13 ENCOUNTER — Inpatient Hospital Stay: Payer: 59 | Admitting: Physician Assistant

## 2022-06-13 ENCOUNTER — Inpatient Hospital Stay: Payer: 59 | Admitting: Adult Health

## 2022-06-13 ENCOUNTER — Telehealth: Payer: Self-pay

## 2022-06-13 ENCOUNTER — Other Ambulatory Visit: Payer: Self-pay

## 2022-06-13 NOTE — Telephone Encounter (Signed)
Pt called stating that her hospital transportation did not come this morning to pick her up for her appts today at the [facility].  Pt also stated that she does not feel well.  Pt stated she sung Sunday in the church choir and one of the ladies there was sick.  She said the lady stated she think she might have the flu.  Pt stated now she's feeling sick.  Pt stated she's having fevers, headache, fatigue, and runny nose.  Asked pt has she contacted her PCP.  Pt stated she saw her PCP on Friday of last week.  Recommended that pt contact her PCP or visit a local Urgent Care to be further assessed to r/o Covid, Flu, RSV, or viral pneumonia.  Pt stated she would.  Canceled the pt's appts scheduled today.  Sent Scheduling message to have appts rescheduled to next week.  Notified Dr. Burr Medico of the pt's call.  Also, sent email to [place]. Who is the [facility] regarding pt's hospital transportation did not pick up pt for appts.

## 2022-06-15 ENCOUNTER — Other Ambulatory Visit (HOSPITAL_COMMUNITY): Payer: Self-pay

## 2022-06-15 MED ORDER — ROSUVASTATIN CALCIUM 20 MG PO TABS
20.0000 mg | ORAL_TABLET | Freq: Every day | ORAL | 1 refills | Status: DC
  Filled 2022-06-15 – 2022-07-17 (×2): qty 90, 90d supply, fill #0

## 2022-06-18 ENCOUNTER — Other Ambulatory Visit (HOSPITAL_COMMUNITY): Payer: Self-pay

## 2022-06-25 ENCOUNTER — Telehealth: Payer: Self-pay

## 2022-06-25 NOTE — Telephone Encounter (Signed)
Pt LVM stating she has questions about her appts.  Look at pt's Appt Desk and pt has no f/u appts scheduled but is actively receiving treatment.  Notified Dr. Burr Medico.

## 2022-06-26 ENCOUNTER — Telehealth: Payer: Self-pay | Admitting: Hematology

## 2022-06-26 NOTE — Progress Notes (Signed)
Sent scheduling message to schedule pt for lab & f/u with Dr. Burr Medico in the next 1 to 2 weeks.  Made Scheduling aware that pt uses hospital transportation.  Requested scheduling to call and speak to pt to confirm her appt date and times.     Spoke with pt prior to sending scheduling message to make her aware that Dr. Ernestina Penna office received her voicemail message on 06/25/2022 and that Dr. Burr Medico would like to see her in clinic.  Made pt aware that someone from Dr. Ernestina Penna scheduling team will be contacting her to schedule her appts.

## 2022-06-26 NOTE — Telephone Encounter (Signed)
Per 1/30 IB, pt has been called and confirmed appt, sent calendar per pt request

## 2022-06-28 ENCOUNTER — Other Ambulatory Visit (HOSPITAL_COMMUNITY): Payer: Self-pay

## 2022-06-29 ENCOUNTER — Other Ambulatory Visit: Payer: Self-pay

## 2022-06-29 ENCOUNTER — Other Ambulatory Visit (HOSPITAL_COMMUNITY): Payer: Self-pay

## 2022-06-29 DIAGNOSIS — I10 Essential (primary) hypertension: Secondary | ICD-10-CM

## 2022-07-02 ENCOUNTER — Other Ambulatory Visit (HOSPITAL_COMMUNITY): Payer: Self-pay

## 2022-07-02 MED ORDER — SPIRONOLACTONE 25 MG PO TABS
25.0000 mg | ORAL_TABLET | Freq: Every day | ORAL | 2 refills | Status: DC
  Filled 2022-07-02: qty 30, 30d supply, fill #0

## 2022-07-04 NOTE — Progress Notes (Unsigned)
[facility]   [phone] [phone]) [phone]   Clinic Follow up Note   Patient Care Team: Linus Galas, MD as PCP - Evalina Field, MD as Consulting Physician (Ophthalmology) Coralie Keens, MD as Consulting Physician (General Surgery) Truitt Merle, MD as Consulting Physician (Hematology) Comer, Okey Regal, MD as Consulting Physician (Infectious Diseases) Doran Stabler, MD as Consulting Physician (Gastroenterology)  Date of Service:  07/05/2022  CHIEF COMPLAINT: f/u of  anemia    CURRENT THERAPY:  Iv feraheme if ferritin<20 Bevacizumab as needed (worsening anemia or clinical GI bleeding), intermittently given proteinuria   ASSESSMENT:  [name] is a 72 y.o. female with   Iron deficiency anemia due to chronic blood loss secondary to GI blood loss, and Anemia of Chronic Disease -longstanding history of intermittent anemia dating back at least 2008 when Hg was 10. -h/o multiple GI bleeding in 2016, required blood transfusion, hospitalization and IV Feraheme -EGD and colonoscopy on 10/29/17 and 03/17/18 with Dr. Loletha Carrow, which were normal. Endoscopy in [facility] and was found to have AVM in small bowel. -Due to recurrent anemia requiring IV Feraheme and blood transfusions, she started bevacizumab for GI bleeding on 08/10/20. She responded very well with resolved anemia and less need for iv iron. This has been held intermittently due to elevated urine protein. Last dose given 04/03/22. -She is clinically doing well, no melena or other GI bleeding lately.  Lab reviewed, hemoglobin 11.9, iron level still pending. -Will continue IV iron and bevacizumab as needed -She has cut down her marijuana and cocaine use, I again encouraged her to completely stop.    PLAN: -lab reviewed  -Lab monthly, IV and bevacizumab as needed. -lab,f/u in 3 months    INTERVAL HISTORY:  [name] is here for a follow up of anemia  She was last seen by  me on 04/16/2022 She presents to the clinic alone. Pt states that everything has been going pretty good.Pt denies having any bleeding. Pt denies having black stool. Pt states  she has cut back on Mariajuana usage.She reports she is still -partaking in Crack Cocaine, but wants to stop.   All other systems were reviewed with the patient and are negative.  MEDICAL HISTORY:  Past Medical History:  Diagnosis Date   Allergic rhinitis    Anemia, iron deficiency 05/03/2011   2/2 GI AVMs - recieves iron infusions as well as periodic red blood cell transfusions   Anxiety    Arteriovenous malformation of duodenum 02/25/2015   CARPAL TUNNEL RELEASE, HX OF 07/22/2008   Chest pain 10/28/2019   Colon polyps    Diabetes mellitus without complication ([place])    type 2   Difficulty sleeping    takes trazadone for sleep   Diverticulosis    Electrolyte abnormality 01/25/2021   Fall 01/03/2020   GERD (gastroesophageal reflux disease)    GI bleed 07/07/2020   Hepatitis C    in SVR with Harvoni 12/2014-02/2015.    History of hernia repair 08/18/2012   History of porphyria 11/30/2014   Porphyria cutanea tarda in setting of chronic Hepatitis C, frequent  IV iron infusions, and alcohol abuse    Hypertension    Hypocalcemia 10/28/2019   Hypocalcemia 01/19/2021   Hypokalemia 09/27/2014   Hypokalemia 01/19/2021   Hypomagnesemia 01/19/2021   Kidney disease 02/04/2019   Peripheral vascular disease ([place])    Prolonged QT interval 01/19/2021   Stroke ([place]) 2010   no residual deficits   Substance use  disorder    cocaine   Tachycardia 07/10/2017   Trichomonas vaginitis 04/16/2019    SURGICAL HISTORY: Past Surgical History:  Procedure Laterality Date   ABDOMINAL HYSTERECTOMY     APPLICATION OF WOUND VAC N/A 06/21/2014   Procedure: APPLICATION OF WOUND VAC;  Surgeon: Coralie Keens, MD;  Location: [facility];  Service: General;  Laterality: N/A;   CARPAL TUNNEL RELEASE     rt hand   COLONOSCOPY WITH PROPOFOL N/A 03/17/2018    Procedure: COLONOSCOPY WITH PROPOFOL;  Surgeon: Doran Stabler, MD;  Location: [facility];  Service: Gastroenterology;  Laterality: N/A;   ENTEROSCOPY N/A 12/04/2012   Procedure: ENTEROSCOPY;  Surgeon: Beryle Beams, MD;  Location: [facility];  Service: Endoscopy;  Laterality: N/A;   ENTEROSCOPY N/A 12/18/2012   Procedure: ENTEROSCOPY;  Surgeon: Beryle Beams, MD;  [facility]: [facility];  Service: Endoscopy;  Laterality: N/A;   ENTEROSCOPY N/A 02/25/2015   Procedure: ENTEROSCOPY;  Surgeon: Inda Castle, MD;  Location: [facility]COPY;  Service: Endoscopy;  Laterality: N/A;   ENTEROSCOPY N/A 10/29/2017   Procedure: ENTEROSCOPY;  Surgeon: Doran Stabler, MD;  [facility];  Service: Gastroenterology;  Laterality: N/A;   ENTEROSCOPY N/A 07/08/2020   Procedure: ENTEROSCOPY;  Surgeon: Milus Banister, MD;  [facility];  Service: Endoscopy;  Laterality: N/A;   ESOPHAGOGASTRODUODENOSCOPY  05/05/2011   Procedure: ESOPHAGOGASTRODUODENOSCOPY (EGD);  Surgeon: Zenovia Jarred, MD;  Location: [facility];  Service: Gastroenterology;  Laterality: N/A;  Dr. Hilarie Fredrickson will do procedure for Dr. Benson Norway Saturday.   ESOPHAGOGASTRODUODENOSCOPY  05/08/2011   Procedure: ESOPHAGOGASTRODUODENOSCOPY (EGD);  Surgeon: Beryle Beams;  [facility];  Service: Endoscopy;  Laterality: N/A;   ESOPHAGOGASTRODUODENOSCOPY  06/07/2011   Procedure: ESOPHAGOGASTRODUODENOSCOPY (EGD);  Surgeon: Beryle Beams, MD;  Location: [facility];  Service: Endoscopy;  Laterality: N/A;   ESOPHAGOGASTRODUODENOSCOPY  12/20/2011   Procedure: ESOPHAGOGASTRODUODENOSCOPY (EGD);  Surgeon: Beryle Beams, MD;  Location: [facility];  Service: Endoscopy;  Laterality: N/A;   EYE SURGERY Bilateral    cataracts   FLEXIBLE SIGMOIDOSCOPY  12/21/2011   Procedure: FLEXIBLE SIGMOIDOSCOPY;  Surgeon: Beryle Beams, MD;  [facility];  Service: Endoscopy;  Laterality: N/A;   HOT HEMOSTASIS  06/07/2011   Procedure: HOT  HEMOSTASIS (ARGON PLASMA COAGULATION/BICAP);  Surgeon: Beryle Beams, MD;  Location: [facility];  Service: Endoscopy;  Laterality: N/A;   HOT HEMOSTASIS N/A 07/08/2020   Procedure: HOT HEMOSTASIS (ARGON PLASMA COAGULATION/BICAP);  Surgeon: Milus Banister, MD;  Location: [facility];  Service: Endoscopy;  Laterality: N/A;   INCISIONAL HERNIA REPAIR N/A 06/01/2014   Procedure: ATTEMPTED LAPAROSCOPIC AND OPEN INCISIONAL HERNIA REPAIR WITH MESH;  Surgeon: Coralie Keens, MD;  Location: [facility];  Service: General;  Laterality: N/A;   INSERTION OF MESH N/A 06/01/2014   Procedure: INSERTION OF MESH;  Surgeon: Coralie Keens, MD;  Location: [facility];  Service: General;  Laterality: N/A;   LAPAROTOMY  02/16/2012   Procedure: EXPLORATORY LAPAROTOMY;  Surgeon: Edward Jolly, MD;  [facility];  Service: General;  Laterality: N/A;  oversewing of anastomotic leak and rigid sigmoidoscopy   LAPAROTOMY N/A 06/21/2014   Procedure: ABDOMINAL WOUND EXPLORATION;  Surgeon: Coralie Keens, MD;  Location: [facility];  Service: General;  Laterality: N/A;   LEG AMPUTATION ABOVE KNEE     left   PARTIAL COLECTOMY  02/15/2012   Procedure: PARTIAL COLECTOMY;  Surgeon: Harl Bowie, MD;  Location: [facility];  Service: General;  Laterality: N/A;  SUBMUCOSAL INJECTION  07/08/2020   Procedure: SUBMUCOSAL INJECTION;  Surgeon: Milus Banister, MD;  Location: [facility];  Service: Endoscopy;;   TONSILLECTOMY     WRIST FUSION Right 04/27/2021   Procedure: SCAPHOID EXCISION  WITH 4 BONE FUSION RIGHT WRIST;  Surgeon: Leanora Cover, MD;  Location: [facility];  Service: Orthopedics;  Laterality: Right;    I have reviewed the social history and family history with the patient and they are unchanged from previous note.  ALLERGIES:  is allergic to ciprofloxacin, morphine and related, penicillins, codeine, lisinopril, and feraheme [ferumoxytol].  MEDICATIONS:  Current Outpatient Medications  Medication Sig  Dispense Refill   ACCU-CHEK FASTCLIX LANCETS MISC Use to test blood glucose 3 times daily. 100 each 11   amLODipine (NORVASC) 10 MG tablet Take 1 tablet (10 mg total) by mouth daily. 90 tablet 1   Blood Glucose Monitoring Suppl (ACCU-CHEK GUIDE) w/Device KIT 1 each by Does not apply route 3 (three) times daily. The patient is insulin requiring, ICD 10 code E11.65. The patient tests 3 times a day. 1 kit 1   calcium-vitamin D (OSCAL WITH D) 500-200 MG-UNIT tablet Take 1 tablet by mouth 2 (two) times daily. 60 tablet 0   carvedilol (COREG) 6.25 MG tablet Take 1 tablet (6.25 mg total) by mouth 2 (two) times daily. 60 tablet 1   colchicine 0.6 MG tablet Take 0.6 mg by mouth daily as needed.     Dulaglutide (TRULICITY) 4.33 IR/5.1OA SOPN Inject 0.75 mg into the skin once a week. 6 mL 3   empagliflozin (JARDIANCE) 10 MG TABS tablet Take 1 tablet (10 mg total) by mouth daily before breakfast. 30 tablet 0   famotidine (PEPCID) 20 MG tablet Take 1 tablet (20 mg total) by mouth 2 (two) times daily. 60 tablet 1   gabapentin (NEURONTIN) 400 MG capsule Take 1 capsule (400 mg total) by mouth in the morning AND 1 capsule (400 mg total) daily in the afternoon AND 2 capsules (800 mg total) before bedtime. 360 capsule 3   glucose blood (ACCU-CHEK GUIDE) test strip Use as instructed 100 strip 3   losartan (COZAAR) 100 MG tablet Take 1 tablet (100 mg total) by mouth daily. 90 tablet 1   magnesium oxide (MAG-OX) 400 (240 Mg) MG tablet Take 1 tablet (400 mg total) by mouth daily. 30 tablet 5   metFORMIN (GLUCOPHAGE) 1000 MG tablet Take 1 tablet (1,000 mg total) by mouth daily with breakfast. 90 tablet 0   omeprazole (PRILOSEC) 20 MG capsule Take 1 capsule (20 mg total) by mouth daily. 30 capsule 2   ondansetron (ZOFRAN) 4 MG tablet Take 1 tablet (4 mg total) by mouth every 8 (eight) hours as needed for nausea or vomiting. 20 tablet 0   rosuvastatin (CRESTOR) 20 MG tablet Take 1 tablet (20 mg total) by mouth daily. 90  tablet 1   spironolactone (ALDACTONE) 25 MG tablet Take 1 tablet (25 mg total) by mouth daily. 30 tablet 2   Vitamin D, Ergocalciferol, (DRISDOL) 1.25 MG (50000 UNIT) CAPS capsule Take 1 capsule (50,000 Units total) by mouth every 7 (seven) days. 8 capsule 0   No current facility-administered medications for this visit.    PHYSICAL EXAMINATION: ECOG PERFORMANCE STATUS: 2 - Symptomatic, <50% confined to bed  Vitals:   07/05/22 1000  BP: 131/88  Pulse: 94  Resp: 18  Temp: 97.9 F (36.6 C)  SpO2: 99%   Wt Readings from Last 3 Encounters:  07/05/22 152 lb 1.6  oz (69 kg)  06/01/22 153 lb (69.4 kg)  05/02/22 151 lb (68.5 kg)     GENERAL:alert, no distress and comfortable SKIN: skin color normal, no rashes or significant lesions EYES: normal, Conjunctiva are pink and non-injected, sclera clear  NEURO: alert & oriented x 3 with fluent speech  LABORATORY DATA:  I have reviewed the data as listed    Latest Ref Rng & Units 07/05/2022    9:33 AM 05/02/2022    9:12 AM 04/16/2022    8:29 AM  CBC  WBC 4.0 - 10.5 K/uL 5.3  7.7  7.9   Hemoglobin 12.0 - 15.0 g/dL 11.9  12.2  9.9   Hematocrit 36.0 - 46.0 % 35.5  38.2  31.6   Platelets 150 - 400 K/uL 229  262  266         Latest Ref Rng & Units 06/01/2022    9:36 AM 04/16/2022    8:29 AM 03/20/2022    9:55 AM  CMP  Glucose 70 - 99 mg/dL 117  244  91   BUN 8 - 27 mg/dL '25  23  18   '$ Creatinine 0.57 - 1.00 mg/dL 1.86  1.56  1.37   Sodium 134 - 144 mmol/L 136  141  141   Potassium 3.5 - 5.2 mmol/L 4.6  3.7  4.7   Chloride 96 - 106 mmol/L 99  107  107   CO2 20 - 29 mmol/L '23  26  22   '$ Calcium 8.7 - 10.3 mg/dL 8.9  8.0  8.6   Total Protein 6.5 - 8.1 g/dL  7.0    Total Bilirubin 0.3 - 1.2 mg/dL  0.2    Alkaline Phos 38 - 126 U/L  101    AST 15 - 41 U/L  27    ALT 0 - 44 U/L  19        RADIOGRAPHIC STUDIES: I have personally reviewed the radiological images as listed and agreed with the findings in the report. No results found.     No orders of the defined types were placed in this encounter.  All questions were answered. The patient knows to call the clinic with any problems, questions or concerns. No barriers to learning was detected. The total time spent in the appointment was 20 minutes.     Truitt Merle, MD 07/05/2022   Felicity Coyer, CMA, am acting as scribe for Truitt Merle, MD.   I have reviewed the above documentation for accuracy and completeness, and I agree with the above.

## 2022-07-05 ENCOUNTER — Inpatient Hospital Stay: Payer: 59 | Attending: Hematology

## 2022-07-05 ENCOUNTER — Other Ambulatory Visit: Payer: Self-pay

## 2022-07-05 ENCOUNTER — Inpatient Hospital Stay (HOSPITAL_BASED_OUTPATIENT_CLINIC_OR_DEPARTMENT_OTHER): Payer: 59 | Admitting: Hematology

## 2022-07-05 ENCOUNTER — Encounter: Payer: Self-pay | Admitting: Hematology

## 2022-07-05 ENCOUNTER — Inpatient Hospital Stay: Payer: 59

## 2022-07-05 VITALS — BP 131/88 | HR 94 | Temp 97.9°F | Resp 18 | Ht 63.0 in | Wt 152.1 lb

## 2022-07-05 DIAGNOSIS — E119 Type 2 diabetes mellitus without complications: Secondary | ICD-10-CM | POA: Insufficient documentation

## 2022-07-05 DIAGNOSIS — K219 Gastro-esophageal reflux disease without esophagitis: Secondary | ICD-10-CM | POA: Diagnosis not present

## 2022-07-05 DIAGNOSIS — I739 Peripheral vascular disease, unspecified: Secondary | ICD-10-CM | POA: Insufficient documentation

## 2022-07-05 DIAGNOSIS — Z86010 Personal history of colonic polyps: Secondary | ICD-10-CM | POA: Diagnosis not present

## 2022-07-05 DIAGNOSIS — C19 Malignant neoplasm of rectosigmoid junction: Secondary | ICD-10-CM

## 2022-07-05 DIAGNOSIS — Z7984 Long term (current) use of oral hypoglycemic drugs: Secondary | ICD-10-CM | POA: Insufficient documentation

## 2022-07-05 DIAGNOSIS — D500 Iron deficiency anemia secondary to blood loss (chronic): Secondary | ICD-10-CM

## 2022-07-05 DIAGNOSIS — N189 Chronic kidney disease, unspecified: Secondary | ICD-10-CM | POA: Diagnosis not present

## 2022-07-05 DIAGNOSIS — Z7985 Long-term (current) use of injectable non-insulin antidiabetic drugs: Secondary | ICD-10-CM | POA: Insufficient documentation

## 2022-07-05 DIAGNOSIS — Z79899 Other long term (current) drug therapy: Secondary | ICD-10-CM | POA: Insufficient documentation

## 2022-07-05 DIAGNOSIS — Z794 Long term (current) use of insulin: Secondary | ICD-10-CM | POA: Diagnosis not present

## 2022-07-05 DIAGNOSIS — I10 Essential (primary) hypertension: Secondary | ICD-10-CM | POA: Insufficient documentation

## 2022-07-05 DIAGNOSIS — K922 Gastrointestinal hemorrhage, unspecified: Secondary | ICD-10-CM

## 2022-07-05 LAB — RAPID URINE DRUG SCREEN, HOSP PERFORMED
Amphetamines: NOT DETECTED
Barbiturates: NOT DETECTED
Benzodiazepines: NOT DETECTED
Cocaine: POSITIVE — AB
Opiates: NOT DETECTED
Tetrahydrocannabinol: POSITIVE — AB

## 2022-07-05 LAB — CBC WITH DIFFERENTIAL (CANCER CENTER ONLY)
Abs Immature Granulocytes: 0.05 10*3/uL (ref 0.00–0.07)
Basophils Absolute: 0 10*3/uL (ref 0.0–0.1)
Basophils Relative: 1 %
Eosinophils Absolute: 0.1 10*3/uL (ref 0.0–0.5)
Eosinophils Relative: 3 %
HCT: 35.5 % — ABNORMAL LOW (ref 36.0–46.0)
Hemoglobin: 11.9 g/dL — ABNORMAL LOW (ref 12.0–15.0)
Immature Granulocytes: 1 %
Lymphocytes Relative: 24 %
Lymphs Abs: 1.3 10*3/uL (ref 0.7–4.0)
MCH: 29.8 pg (ref 26.0–34.0)
MCHC: 33.5 g/dL (ref 30.0–36.0)
MCV: 89 fL (ref 80.0–100.0)
Monocytes Absolute: 0.7 10*3/uL (ref 0.1–1.0)
Monocytes Relative: 14 %
Neutro Abs: 3.1 10*3/uL (ref 1.7–7.7)
Neutrophils Relative %: 57 %
Platelet Count: 229 10*3/uL (ref 150–400)
RBC: 3.99 MIL/uL (ref 3.87–5.11)
RDW: 18.2 % — ABNORMAL HIGH (ref 11.5–15.5)
WBC Count: 5.3 10*3/uL (ref 4.0–10.5)
nRBC: 0 % (ref 0.0–0.2)

## 2022-07-05 LAB — TOTAL PROTEIN, URINE DIPSTICK: Protein, ur: 100 mg/dL — AB

## 2022-07-05 LAB — FERRITIN: Ferritin: 54 ng/mL (ref 11–307)

## 2022-07-05 NOTE — Assessment & Plan Note (Signed)
secondary to GI blood loss, and Anemia of Chronic Disease -longstanding history of intermittent anemia dating back at least 2008 when Hg was 10. -h/o multiple GI bleeding in 2016, required blood transfusion, hospitalization and IV Feraheme -EGD and colonoscopy on 10/29/17 and 03/17/18 with Dr. Loletha Carrow, which were normal. Endoscopy in [facility] and was found to have AVM in small bowel. -Due to recurrent anemia requiring IV Feraheme and blood transfusions, she started bevacizumab for GI bleeding on 08/10/20. She responded very well with resolved anemia and less need for iv iron. This has been held intermittently due to elevated urine protein. Last dose given 04/03/22.

## 2022-07-16 ENCOUNTER — Other Ambulatory Visit (HOSPITAL_COMMUNITY): Payer: Self-pay

## 2022-07-17 ENCOUNTER — Other Ambulatory Visit (HOSPITAL_COMMUNITY): Payer: Self-pay

## 2022-07-31 ENCOUNTER — Encounter: Payer: Self-pay | Admitting: Student

## 2022-07-31 ENCOUNTER — Ambulatory Visit (INDEPENDENT_AMBULATORY_CARE_PROVIDER_SITE_OTHER): Payer: 59 | Admitting: Student

## 2022-07-31 ENCOUNTER — Other Ambulatory Visit (HOSPITAL_COMMUNITY): Payer: Self-pay

## 2022-07-31 ENCOUNTER — Encounter: Payer: Self-pay | Admitting: Hematology

## 2022-07-31 VITALS — BP 139/81 | HR 90 | Temp 98.1°F | Wt 155.2 lb

## 2022-07-31 DIAGNOSIS — R209 Unspecified disturbances of skin sensation: Secondary | ICD-10-CM

## 2022-07-31 DIAGNOSIS — Z0000 Encounter for general adult medical examination without abnormal findings: Secondary | ICD-10-CM

## 2022-07-31 DIAGNOSIS — N1831 Chronic kidney disease, stage 3a: Secondary | ICD-10-CM

## 2022-07-31 DIAGNOSIS — E1140 Type 2 diabetes mellitus with diabetic neuropathy, unspecified: Secondary | ICD-10-CM | POA: Diagnosis not present

## 2022-07-31 DIAGNOSIS — E782 Mixed hyperlipidemia: Secondary | ICD-10-CM

## 2022-07-31 DIAGNOSIS — K219 Gastro-esophageal reflux disease without esophagitis: Secondary | ICD-10-CM

## 2022-07-31 DIAGNOSIS — R442 Other hallucinations: Secondary | ICD-10-CM

## 2022-07-31 DIAGNOSIS — E1122 Type 2 diabetes mellitus with diabetic chronic kidney disease: Secondary | ICD-10-CM

## 2022-07-31 DIAGNOSIS — E2839 Other primary ovarian failure: Secondary | ICD-10-CM

## 2022-07-31 DIAGNOSIS — D649 Anemia, unspecified: Secondary | ICD-10-CM

## 2022-07-31 MED ORDER — ROSUVASTATIN CALCIUM 20 MG PO TABS
20.0000 mg | ORAL_TABLET | Freq: Every day | ORAL | 2 refills | Status: DC
  Filled 2022-07-31: qty 90, 90d supply, fill #0
  Filled 2022-10-31: qty 90, 90d supply, fill #1
  Filled 2023-02-08: qty 90, 90d supply, fill #2

## 2022-07-31 MED ORDER — EMPAGLIFLOZIN 10 MG PO TABS
10.0000 mg | ORAL_TABLET | Freq: Every day | ORAL | 0 refills | Status: DC
  Filled 2022-07-31: qty 30, 30d supply, fill #0

## 2022-07-31 MED ORDER — OMEPRAZOLE 20 MG PO CPDR
20.0000 mg | DELAYED_RELEASE_CAPSULE | Freq: Every day | ORAL | 2 refills | Status: DC
  Filled 2022-07-31: qty 30, 30d supply, fill #0
  Filled 2022-09-03: qty 30, 30d supply, fill #1
  Filled 2022-10-05 (×2): qty 30, 30d supply, fill #2

## 2022-07-31 MED ORDER — FOLIC ACID 1 MG PO TABS
1.0000 mg | ORAL_TABLET | Freq: Every day | ORAL | 2 refills | Status: DC
  Filled 2022-07-31: qty 30, 30d supply, fill #0

## 2022-07-31 MED ORDER — CAMPHOR-MENTHOL 0.5-0.5 % EX LOTN
1.0000 | TOPICAL_LOTION | CUTANEOUS | 1 refills | Status: AC | PRN
  Filled 2022-07-31: qty 222, fill #0

## 2022-07-31 NOTE — Assessment & Plan Note (Addendum)
Patient is here for evaluation of her abnormal skin sensation that has been happening since January 2023.  She reports sensation of bugs crawling over her skin, especially on her arms, upper back and her hair.  She has been to scratching aggressively and picking on the skin to alleviate these feelings.  She has not visualized any bugs.  Her partner who is living with her also did not visualize any bugs.  She denies any obvious visual or auditory hallucination.  Only new medication recently was Jardiance.  She has been using cocaine once a week for the last several years.  She has been smoking marijuana frequently.  Physical exam revealed multiple new excoriation marks on the left upper arm.  There are scarred and healed excoriation marks on her upper back and right upper arm.  There is no rash dermatitis or erythematous skin findings.  Broad differential for her abnormal skin sensation.  Cocaine and THC use can cause tactile hallucination.  Other reversible causes can include B12/folate deficiency, thyroid disease, syphilis, HIV, uremia or hyperammonia.  No obvious dermatologic condition that is causing her sensation.  -Advised patient to stop cocaine and THC use.  Patient is motivated to stop -Obtain CMP, B12, TSH, RPR and HIV -Start folate supplementation empirically -Prescribed Sarna topical to help relieve the sensation -Follow-up with PCP in April

## 2022-07-31 NOTE — Progress Notes (Signed)
CC: abnormal skin sensation  HPI:  [name] is a 72 y.o. living with HTN, T2Dm, CKD3, left AKA, IDA, presents to clinic for evaluation of abnormal skin sensation.   Please see problem based charting for detail  Past Medical History:  Diagnosis Date   Allergic rhinitis    Anemia, iron deficiency 05/03/2011   2/2 GI AVMs - recieves iron infusions as well as periodic red blood cell transfusions   Anxiety    Arteriovenous malformation of duodenum 02/25/2015   CARPAL TUNNEL RELEASE, HX OF 07/22/2008   Chest pain 10/28/2019   Colon polyps    Diabetes mellitus without complication ([place])    type 2   Difficulty sleeping    takes trazadone for sleep   Diverticulosis    Electrolyte abnormality 01/25/2021   Fall 01/03/2020   GERD (gastroesophageal reflux disease)    GI bleed 07/07/2020   Hepatitis C    in SVR with Harvoni 12/2014-02/2015.    History of hernia repair 08/18/2012   History of porphyria 11/30/2014   Porphyria cutanea tarda in setting of chronic Hepatitis C, frequent  IV iron infusions, and alcohol abuse    Hypertension    Hypocalcemia 10/28/2019   Hypocalcemia 01/19/2021   Hypokalemia 09/27/2014   Hypokalemia 01/19/2021   Hypomagnesemia 01/19/2021   Kidney disease 02/04/2019   Peripheral vascular disease ([place])    Prolonged QT interval 01/19/2021   Stroke ([place]) 2010   no residual deficits   Substance use disorder    cocaine   Tachycardia 07/10/2017   Trichomonas vaginitis 04/16/2019   Review of Systems:  per HPI  Physical Exam:  Vitals:   07/31/22 0840  BP: 139/81  Pulse: 90  Temp: 98.1 F (36.7 C)  TempSrc: Oral  SpO2: 100%  Weight: 155 lb 3.2 oz (70.4 kg)   Physical Exam Constitutional:      General: She is not in acute distress.    Appearance: She is not ill-appearing.  HENT:     Head: Normocephalic.  Eyes:     General:        Right eye: No discharge.        Left eye: No discharge.     Conjunctiva/sclera: Conjunctivae normal.  Cardiovascular:      Comments: No LE edema Pulmonary:     Effort: Pulmonary effort is normal. No respiratory distress.  Musculoskeletal:        General: Normal range of motion.     Cervical back: Normal range of motion.     Right lower leg: No edema.     Left lower leg: No edema.  Skin:    General: Skin is warm.  Neurological:     Mental Status: She is alert. Mental status is at baseline.  Psychiatric:        Mood and Affect: Mood normal.           Assessment & Plan:   See Encounters Tab for problem based charting.  Sensation disturbance of skin Patient is here for evaluation of her abnormal skin sensation that has been happening since January 2023.  She reports sensation of bugs crawling over her skin, especially on her arms, upper back and her hair.  She has been to scratching aggressively and picking on the skin to alleviate these feelings.  She has not visualized any bugs.  Her partner who is living with her also did not visualize any bugs.  She denies any obvious visual or auditory hallucination.  Only new medication recently  was Jardiance.  She has been using cocaine once a week for the last several years.  She has been smoking marijuana frequently.  Physical exam revealed multiple new excoriation marks on the left upper arm.  There are scarred and healed excoriation marks on her upper back and right upper arm.  There is no rash dermatitis or erythematous skin findings.  Broad differential for her abnormal skin sensation.  Cocaine and THC use can cause tactile hallucination.  Other reversible causes can include B12/folate deficiency, thyroid disease, syphilis, HIV, uremia or hyperammonia.  No obvious dermatologic condition that is causing her sensation.  -Advised patient to stop cocaine and THC use.  Patient is motivated to stop -Obtain CMP, B12, TSH, RPR and HIV -Start folate supplementation empirically -Prescribed Sarna topical to help relieve the sensation -Follow-up with PCP in  April  Stage 3a chronic kidney disease (CKD) ([place]) Last BMP in January showed worsening of function with creatinine 1.86 and GFR of 29.  She also had nephrotic range proteinuria 8 g.  She does not exhibit any nephrotic syndrome symptoms.  No signs of volume overload on exam.  She has seen a nephrologist in the past but has not followed-up for a long time.  -Recheck CMP and urine protein/creatinine ratio -If confirmed nephrotic range proteinuria, will place a referral to nephrology.  Patient is already on an ARB, SGLT2 and GLP-1  Type 2 diabetes mellitus with diabetic neuropathy (HCC) A1c is in normal range 5.82 months ago.  This could be falsely low in the setting of anemia.  She report adherence to Trulicity and Jardiance.  She was advised to stop metformin but she has been taking it every other day.   -Advised patient to stop metformin in setting of worsening kidney function -Continue Trulicity and Jardiance.  Healthcare maintenance -DEXA scan ordered -Patient declined shingles vaccines   Patient discussed with Dr. Evette Doffing

## 2022-07-31 NOTE — Assessment & Plan Note (Signed)
>>  ASSESSMENT AND PLAN FOR TYPE 2 DIABETES MELLITUS WITH DIABETIC NEUROPATHY (HCC) WRITTEN ON 07/31/2022  9:45 AM BY NGUYEN, QUAN, DO  A1c is in normal range 5.82 months ago.  This could be falsely low in the setting of anemia.  She report adherence to Trulicity  and Jardiance .  She was advised to stop metformin  but she has been taking it every other day.   -Advised patient to stop metformin  in setting of worsening kidney function -Continue Trulicity  and Jardiance .

## 2022-07-31 NOTE — Assessment & Plan Note (Signed)
-  DEXA scan ordered -Patient declined shingles vaccines

## 2022-07-31 NOTE — Assessment & Plan Note (Addendum)
A1c is in normal range 5.82 months ago.  This could be falsely low in the setting of anemia.  She report adherence to Trulicity and Jardiance.  She was advised to stop metformin but she has been taking it every other day.   -Advised patient to stop metformin in setting of worsening kidney function -Continue Trulicity and Jardiance.

## 2022-07-31 NOTE — Patient Instructions (Addendum)
[name],  It was nice seeing you in the clinic today. Here is a summary what we talked about:  Skin sensation: We will obtain blood work to rule out any underlying causes of your condition.  Please stop using cocaine and THC as we talked about.  I sent a prescription of Sarna cream to help relieve the sensation.  2.  We will check blood work and urine test for your kidney function.  If there is a high amount of protein in the urine, we may have to send you to a kidney doctor.  3.  I started you on folate supplementation.  Please continue to follow-up with your hematologist for iron transfusion.  4.  Please stop using metformin.  Please continue Trulicity and Jardiance.  Please follow-up in April with your regular doctor  Dr. Alfonse Spruce

## 2022-07-31 NOTE — Assessment & Plan Note (Signed)
Last BMP in January showed worsening of function with creatinine 1.86 and GFR of 29.  She also had nephrotic range proteinuria 8 g.  She does not exhibit any nephrotic syndrome symptoms.  No signs of volume overload on exam.  She has seen a nephrologist in the past but has not followed-up for a long time.  -Recheck CMP and urine protein/creatinine ratio -If confirmed nephrotic range proteinuria, will place a referral to nephrology.  Patient is already on an ARB, SGLT2 and GLP-1

## 2022-08-01 ENCOUNTER — Encounter: Payer: Self-pay | Admitting: Hematology

## 2022-08-01 ENCOUNTER — Other Ambulatory Visit: Payer: Self-pay

## 2022-08-01 DIAGNOSIS — C19 Malignant neoplasm of rectosigmoid junction: Secondary | ICD-10-CM

## 2022-08-01 DIAGNOSIS — D500 Iron deficiency anemia secondary to blood loss (chronic): Secondary | ICD-10-CM

## 2022-08-01 NOTE — Progress Notes (Signed)
Internal Medicine Clinic Attending  Case discussed with Dr. Nguyen  At the time of the visit.  We reviewed the resident's history and exam and pertinent patient test results.  I agree with the assessment, diagnosis, and plan of care documented in the resident's note. 

## 2022-08-02 ENCOUNTER — Inpatient Hospital Stay: Payer: 59

## 2022-08-02 ENCOUNTER — Inpatient Hospital Stay: Payer: 59 | Attending: Hematology

## 2022-08-02 ENCOUNTER — Other Ambulatory Visit: Payer: Self-pay

## 2022-08-02 ENCOUNTER — Encounter: Payer: Self-pay | Admitting: Hematology

## 2022-08-02 DIAGNOSIS — F1410 Cocaine abuse, uncomplicated: Secondary | ICD-10-CM | POA: Insufficient documentation

## 2022-08-02 DIAGNOSIS — F1210 Cannabis abuse, uncomplicated: Secondary | ICD-10-CM | POA: Diagnosis not present

## 2022-08-02 DIAGNOSIS — D500 Iron deficiency anemia secondary to blood loss (chronic): Secondary | ICD-10-CM | POA: Insufficient documentation

## 2022-08-02 DIAGNOSIS — C19 Malignant neoplasm of rectosigmoid junction: Secondary | ICD-10-CM

## 2022-08-02 LAB — CMP (CANCER CENTER ONLY)
ALT: 19 U/L (ref 0–44)
AST: 23 U/L (ref 15–41)
Albumin: 3.9 g/dL (ref 3.5–5.0)
Alkaline Phosphatase: 96 U/L (ref 38–126)
Anion gap: 6 (ref 5–15)
BUN: 25 mg/dL — ABNORMAL HIGH (ref 8–23)
CO2: 25 mmol/L (ref 22–32)
Calcium: 9 mg/dL (ref 8.9–10.3)
Chloride: 109 mmol/L (ref 98–111)
Creatinine: 1.81 mg/dL — ABNORMAL HIGH (ref 0.44–1.00)
GFR, Estimated: 30 mL/min — ABNORMAL LOW (ref >60)
Glucose, Bld: 127 mg/dL — ABNORMAL HIGH (ref 70–99)
Potassium: 4.2 mmol/L (ref 3.5–5.1)
Sodium: 140 mmol/L (ref 135–145)
Total Bilirubin: 0.2 mg/dL — ABNORMAL LOW (ref 0.3–1.2)
Total Protein: 6.9 g/dL (ref 6.5–8.1)

## 2022-08-02 LAB — CBC WITH DIFFERENTIAL (CANCER CENTER ONLY)
Abs Immature Granulocytes: 0.14 10*3/uL — ABNORMAL HIGH (ref 0.00–0.07)
Basophils Absolute: 0.1 10*3/uL (ref 0.0–0.1)
Basophils Relative: 0 %
Eosinophils Absolute: 0.1 10*3/uL (ref 0.0–0.5)
Eosinophils Relative: 1 %
HCT: 31.2 % — ABNORMAL LOW (ref 36.0–46.0)
Hemoglobin: 10 g/dL — ABNORMAL LOW (ref 12.0–15.0)
Immature Granulocytes: 1 %
Lymphocytes Relative: 14 %
Lymphs Abs: 1.6 10*3/uL (ref 0.7–4.0)
MCH: 30.7 pg (ref 26.0–34.0)
MCHC: 32.1 g/dL (ref 30.0–36.0)
MCV: 95.7 fL (ref 80.0–100.0)
Monocytes Absolute: 1.3 10*3/uL — ABNORMAL HIGH (ref 0.1–1.0)
Monocytes Relative: 11 %
Neutro Abs: 8.1 10*3/uL — ABNORMAL HIGH (ref 1.7–7.7)
Neutrophils Relative %: 73 %
Platelet Count: 271 10*3/uL (ref 150–400)
RBC: 3.26 MIL/uL — ABNORMAL LOW (ref 3.87–5.11)
RDW: 15.3 % (ref 11.5–15.5)
WBC Count: 11.3 10*3/uL — ABNORMAL HIGH (ref 4.0–10.5)
nRBC: 0 % (ref 0.0–0.2)

## 2022-08-02 LAB — PROTEIN / CREATININE RATIO, URINE
Creatinine, Urine: 88.9 mg/dL
Protein, Ur: 248.6 mg/dL
Protein/Creat Ratio: 2796 mg/g creat — ABNORMAL HIGH (ref 0–200)

## 2022-08-02 LAB — CMP14 + ANION GAP
ALT: 11 IU/L (ref 0–32)
AST: 17 IU/L (ref 0–40)
Albumin/Globulin Ratio: 1.5 (ref 1.2–2.2)
Albumin: 4.1 g/dL (ref 3.9–4.9)
Alkaline Phosphatase: 116 IU/L (ref 44–121)
Anion Gap: 15 mmol/L (ref 10.0–18.0)
BUN/Creatinine Ratio: 18 (ref 12–28)
BUN: 28 mg/dL — ABNORMAL HIGH (ref 8–27)
Bilirubin Total: <0.2 mg/dL (ref 0.0–1.2)
CO2: 21 mmol/L (ref 20–29)
Calcium: 9.3 mg/dL (ref 8.7–10.3)
Chloride: 105 mmol/L (ref 96–106)
Creatinine, Ser: 1.56 mg/dL — ABNORMAL HIGH (ref 0.57–1.00)
Globulin, Total: 2.7 g/dL (ref 1.5–4.5)
Glucose: 92 mg/dL (ref 70–99)
Potassium: 4.3 mmol/L (ref 3.5–5.2)
Sodium: 141 mmol/L (ref 134–144)
Total Protein: 6.8 g/dL (ref 6.0–8.5)
eGFR: 36 mL/min/{1.73_m2} — ABNORMAL LOW (ref >59)

## 2022-08-02 LAB — RAPID URINE DRUG SCREEN, HOSP PERFORMED
Amphetamines: NOT DETECTED
Barbiturates: NOT DETECTED
Benzodiazepines: NOT DETECTED
Cocaine: POSITIVE — AB
Opiates: NOT DETECTED
Tetrahydrocannabinol: NOT DETECTED

## 2022-08-02 LAB — TOTAL PROTEIN, URINE DIPSTICK: Protein, ur: 100 mg/dL — AB

## 2022-08-02 LAB — VITAMIN B12: Vitamin B-12: 565 pg/mL (ref 232–1245)

## 2022-08-02 LAB — TSH: TSH: 1.37 u[IU]/mL (ref 0.450–4.500)

## 2022-08-02 LAB — RPR: RPR Ser Ql: NONREACTIVE

## 2022-08-02 LAB — HIV ANTIBODY (ROUTINE TESTING W REFLEX): HIV Screen 4th Generation wRfx: NONREACTIVE

## 2022-08-02 LAB — FERRITIN: Ferritin: 35 ng/mL (ref 11–307)

## 2022-08-08 ENCOUNTER — Telehealth: Payer: Self-pay | Admitting: *Deleted

## 2022-08-08 ENCOUNTER — Other Ambulatory Visit (HOSPITAL_COMMUNITY): Payer: Self-pay

## 2022-08-08 MED ORDER — CARVEDILOL 6.25 MG PO TABS
12.5000 mg | ORAL_TABLET | Freq: Every day | ORAL | 3 refills | Status: DC
  Filled 2022-08-08: qty 180, 90d supply, fill #0
  Filled 2022-10-31: qty 180, 90d supply, fill #1

## 2022-08-08 NOTE — Telephone Encounter (Signed)
Patient called in stating she did not receive Sarna lotion. Spoke with Ebony Hail at [facility]. States this is OTC and they do not carry it. States patient can pick this up at [facility] or [facility]. Patient notified.

## 2022-08-13 ENCOUNTER — Other Ambulatory Visit: Payer: Self-pay | Admitting: Internal Medicine

## 2022-08-13 ENCOUNTER — Ambulatory Visit (INDEPENDENT_AMBULATORY_CARE_PROVIDER_SITE_OTHER): Payer: 59 | Admitting: Student

## 2022-08-13 DIAGNOSIS — N393 Stress incontinence (female) (male): Secondary | ICD-10-CM

## 2022-08-13 DIAGNOSIS — R350 Frequency of micturition: Secondary | ICD-10-CM

## 2022-08-21 ENCOUNTER — Other Ambulatory Visit (HOSPITAL_COMMUNITY): Payer: Self-pay

## 2022-08-30 ENCOUNTER — Inpatient Hospital Stay: Payer: 59 | Attending: Hematology

## 2022-09-03 ENCOUNTER — Other Ambulatory Visit (HOSPITAL_COMMUNITY): Payer: Self-pay

## 2022-09-03 ENCOUNTER — Ambulatory Visit (INDEPENDENT_AMBULATORY_CARE_PROVIDER_SITE_OTHER): Payer: 59

## 2022-09-03 ENCOUNTER — Ambulatory Visit (INDEPENDENT_AMBULATORY_CARE_PROVIDER_SITE_OTHER): Payer: 59 | Admitting: *Deleted

## 2022-09-03 ENCOUNTER — Other Ambulatory Visit: Payer: Self-pay

## 2022-09-03 ENCOUNTER — Encounter: Payer: Self-pay | Admitting: Hematology

## 2022-09-03 VITALS — BP 127/74 | HR 64 | Temp 98.1°F | Resp 24 | Ht 63.0 in | Wt 153.6 lb

## 2022-09-03 VITALS — BP 127/74 | HR 64 | Temp 98.1°F | Ht 63.0 in | Wt 153.7 lb

## 2022-09-03 DIAGNOSIS — E1140 Type 2 diabetes mellitus with diabetic neuropathy, unspecified: Secondary | ICD-10-CM | POA: Diagnosis not present

## 2022-09-03 DIAGNOSIS — N1831 Chronic kidney disease, stage 3a: Secondary | ICD-10-CM

## 2022-09-03 DIAGNOSIS — E1122 Type 2 diabetes mellitus with diabetic chronic kidney disease: Secondary | ICD-10-CM | POA: Diagnosis not present

## 2022-09-03 DIAGNOSIS — Z7984 Long term (current) use of oral hypoglycemic drugs: Secondary | ICD-10-CM

## 2022-09-03 DIAGNOSIS — Z89612 Acquired absence of left leg above knee: Secondary | ICD-10-CM

## 2022-09-03 DIAGNOSIS — S78112A Complete traumatic amputation at level between left hip and knee, initial encounter: Secondary | ICD-10-CM

## 2022-09-03 DIAGNOSIS — Z0000 Encounter for general adult medical examination without abnormal findings: Secondary | ICD-10-CM | POA: Diagnosis not present

## 2022-09-03 DIAGNOSIS — R3981 Functional urinary incontinence: Secondary | ICD-10-CM

## 2022-09-03 DIAGNOSIS — E559 Vitamin D deficiency, unspecified: Secondary | ICD-10-CM

## 2022-09-03 DIAGNOSIS — D649 Anemia, unspecified: Secondary | ICD-10-CM

## 2022-09-03 DIAGNOSIS — I10 Essential (primary) hypertension: Secondary | ICD-10-CM

## 2022-09-03 DIAGNOSIS — I129 Hypertensive chronic kidney disease with stage 1 through stage 4 chronic kidney disease, or unspecified chronic kidney disease: Secondary | ICD-10-CM | POA: Diagnosis not present

## 2022-09-03 DIAGNOSIS — K219 Gastro-esophageal reflux disease without esophagitis: Secondary | ICD-10-CM

## 2022-09-03 LAB — POCT GLYCOSYLATED HEMOGLOBIN (HGB A1C): Hemoglobin A1C: 5.3 % (ref 4.0–5.6)

## 2022-09-03 LAB — GLUCOSE, CAPILLARY: Glucose-Capillary: 90 mg/dL (ref 70–99)

## 2022-09-03 MED ORDER — FOLIC ACID 1 MG PO TABS
1.0000 mg | ORAL_TABLET | Freq: Every day | ORAL | 2 refills | Status: DC
Start: 2022-09-03 — End: 2023-08-09
  Filled 2022-09-03 – 2022-10-31 (×2): qty 30, 30d supply, fill #0

## 2022-09-03 MED ORDER — FAMOTIDINE 20 MG PO TABS
20.0000 mg | ORAL_TABLET | Freq: Two times a day (BID) | ORAL | 1 refills | Status: DC
Start: 2022-09-03 — End: 2022-10-31
  Filled 2022-09-03: qty 60, 30d supply, fill #0
  Filled 2022-10-05 (×2): qty 60, 30d supply, fill #1

## 2022-09-03 MED ORDER — AMLODIPINE BESYLATE 10 MG PO TABS
10.0000 mg | ORAL_TABLET | Freq: Every day | ORAL | 1 refills | Status: DC
Start: 2022-09-03 — End: 2023-03-12
  Filled 2022-09-03: qty 90, 90d supply, fill #0
  Filled 2022-12-05: qty 90, 90d supply, fill #1

## 2022-09-03 MED ORDER — EMPAGLIFLOZIN 10 MG PO TABS
10.0000 mg | ORAL_TABLET | Freq: Every day | ORAL | 0 refills | Status: DC
Start: 2022-09-03 — End: 2022-10-05
  Filled 2022-09-03: qty 30, 30d supply, fill #0

## 2022-09-03 NOTE — Patient Instructions (Addendum)
[name], it was a pleasure seeing you today! You endorsed feeling well today. Below are some of the things we talked about this visit. We look forward to seeing you in the follow up appointment!  Today we discussed:  [facility]. I'll call you to talk about your vitamin D level after getting back and then come up with a plan. I will place an order for the commode seat I will take spironolactone off your med list, I do not think you need it anymore  I have ordered the following labs today:  Lab Orders         Vitamin D (25 hydroxy)         Glucose, capillary         POC Hbg A1C       Referrals ordered today:   Referral Orders  No referral(s) requested today     I have ordered the following medication/changed the following medications:   Stop the following medications: Medications Discontinued During This Encounter  Medication Reason   famotidine (PEPCID) 20 MG tablet Reorder   amLODipine (NORVASC) 10 MG tablet Reorder   empagliflozin (JARDIANCE) 10 MG TABS tablet Reorder   folic acid (FOLVITE) 1 MG tablet Reorder   spironolactone (ALDACTONE) 25 MG tablet Change in therapy     Start the following medications: Meds ordered this encounter  Medications   amLODipine (NORVASC) 10 MG tablet    Sig: Take 1 tablet (10 mg total) by mouth daily.    Dispense:  90 tablet    Refill:  1   empagliflozin (JARDIANCE) 10 MG TABS tablet    Sig: Take 1 tablet (10 mg total) by mouth daily before breakfast.    Dispense:  30 tablet    Refill:  0   famotidine (PEPCID) 20 MG tablet    Sig: Take 1 tablet (20 mg total) by mouth 2 (two) times daily.    Dispense:  60 tablet    Refill:  1   folic acid (FOLVITE) 1 MG tablet    Sig: Take 1 tablet (1 mg total) by mouth daily.    Dispense:  30 tablet    Refill:  2     Follow-up: 4-6 months   Please make sure to arrive 15 minutes prior to your next appointment. If you arrive late, you may be asked to  reschedule.   We look forward to seeing you next time. Please call our clinic at [phone] if you have any questions or concerns. The best time to call is Monday-Friday from 9am-4pm, but there is someone available 24/7. If after hours or the weekend, call the main hospital number and ask for the Internal Medicine Resident On-Call. If you need medication refills, please notify your pharmacy one week in advance and they will send [place] a request.  Thank you for letting us take part in your care. Wishing you the best!  Thank you, Lyndle Herrlich MD

## 2022-09-03 NOTE — Assessment & Plan Note (Signed)
>>  ASSESSMENT AND PLAN FOR TYPE 2 DIABETES MELLITUS WITH DIABETIC NEUROPATHY (HCC) WRITTEN ON 09/03/2022 10:06 AM BY SYNTHIA MERCHANT, MD  Will recheck A1c today to see how it looks off metformin , continue trulicity  and jardiance

## 2022-09-03 NOTE — Progress Notes (Addendum)
   CC: Routine follow-up  HPI:  [name] is a 72 y.o.-year-old female with past medical history as below presenting for routine f\u.  Please see encounters tab for problem-based charting.  Past Medical History:  Diagnosis Date   Allergic rhinitis    Anemia, iron deficiency 05/03/2011   2/2 GI AVMs - recieves iron infusions as well as periodic red blood cell transfusions   Anxiety    Arteriovenous malformation of duodenum 02/25/2015   CARPAL TUNNEL RELEASE, HX OF 07/22/2008   Chest pain 10/28/2019   Colon polyps    Diabetes mellitus without complication    type 2   Difficulty sleeping    takes trazadone for sleep   Diverticulosis    Electrolyte abnormality 01/25/2021   Fall 01/03/2020   GERD (gastroesophageal reflux disease)    GI bleed 07/07/2020   Hepatitis C    in SVR with Harvoni 12/2014-02/2015.    History of hernia repair 08/18/2012   History of porphyria 11/30/2014   Porphyria cutanea tarda in setting of chronic Hepatitis C, frequent  IV iron infusions, and alcohol abuse    Hypertension    Hypocalcemia 10/28/2019   Hypocalcemia 01/19/2021   Hypokalemia 09/27/2014   Hypokalemia 01/19/2021   Hypomagnesemia 01/19/2021   Kidney disease 02/04/2019   Peripheral vascular disease    Prolonged QT interval 01/19/2021   Stroke 2010   no residual deficits   Substance use disorder    cocaine   Tachycardia 07/10/2017   Trichomonas vaginitis 04/16/2019   Review of Systems: As in HPI.  Please see encounters tab for problem based charting.  Physical Exam:  Vitals:   09/03/22 0920  BP: 127/74  Pulse: 64  Resp: (!) 24  Temp: 98.1 F (36.7 C)  TempSrc: Oral  SpO2: 99%  Weight: 153 lb 9.6 oz (69.7 kg)  Height: 5\' 3"  (1.6 m)   General:Well-appearing, pleasant, In NAD Cardiac: RRR, no murmurs rubs or gallops. Respiratory: Normal work of breathing on room air, CTAB Abdominal: Soft, nontender, nondistended MSK: s/p left AKA  Assessment & Plan:   Essential hypertension,  benign Blood pressure well-managed on current regimen of amlodipine 10, Coreg 6.25 twice daily, losartan 100 mg daily. No concerning symptoms. Refilled today as needed.   GERD Symptoms controlled, refilled pepcid  Type 2 diabetes mellitus with diabetic neuropathy (HCC) Will recheck A1c today to see how it looks off metformin, continue trulicity and jardiance  Stage 3a chronic kidney disease (CKD) (HCC) Following with [facility], no concerns today.   Above knee amputation of left lower extremity (HCC) Asking for elevated commode seat as she has difficulty sitting on her toilet now  Vitamin D deficiency She is not sure if she completed the supplementation plan for vitamin D there without during last visit.  Will recheck today and follow-up next appropriate.  Anticipate she will likely need high-dose vitamin D supplementation.  Addendum: Vit D still < 10, resent 50000 units weekly supplement for 8 weeks. Called ms Hacking to let her know the plan and she said she will go pick it up   Functional incontinence Patient has difficulty getting to and using bedside commode now that she is s\p L AKA. Requesting elevated commode seat due to difficulty lowering herself onto commode and incontinence supplies. I think she would benefit from both.    Patient discussed with Dr. Mikey Bussing

## 2022-09-03 NOTE — Patient Instructions (Signed)

## 2022-09-03 NOTE — Progress Notes (Signed)
Subjective:   [name] is a 72 y.o. female who presents for an Initial Medicare Annual Wellness Visit.  Review of Systems    Defer to pcp         Objective:    Today's Vitals   09/03/22 0920 09/03/22 1419  BP: 127/74   Pulse: 64   Temp: 98.1 F (36.7 C)   TempSrc: Oral   SpO2: 99%   Weight: 153 lb 10.6 oz (69.7 kg)   Height: 5\' 3"  (1.6 m)   PainSc:  0-No pain   Body mass index is 27.22 kg/m.     09/03/2022    2:17 PM 09/03/2022    9:20 AM 07/31/2022    9:12 AM 03/20/2022    9:32 AM 03/07/2022   10:08 AM 11/21/2021    8:41 AM 07/27/2021    2:57 PM  Advanced Directives  Does Patient Have a Medical Advance Directive? No No No No No No No  Would patient like information on creating a medical advance directive? No - Patient declined No - Patient declined No - Patient declined No - Patient declined Yes (MAU/Ambulatory/Procedural Areas - Information given) No - Patient declined No - Patient declined    Current Medications (verified) Outpatient Encounter Medications as of 09/03/2022  Medication Sig   ACCU-CHEK FASTCLIX LANCETS MISC Use to test blood glucose 3 times daily.   amLODipine (NORVASC) 10 MG tablet Take 1 tablet (10 mg total) by mouth daily.   Blood Glucose Monitoring Suppl (ACCU-CHEK GUIDE) w/Device KIT 1 each by Does not apply route 3 (three) times daily. The patient is insulin requiring, ICD 10 code E11.65. The patient tests 3 times a day.   calcium-vitamin D (OSCAL WITH D) 500-200 MG-UNIT tablet Take 1 tablet by mouth 2 (two) times daily.   camphor-menthol (SARNA) lotion Apply 1 Application topically as needed for itching.   carvedilol (COREG) 6.25 MG tablet Take 2 tablets (12.5 mg total) by mouth daily.   colchicine 0.6 MG tablet Take 0.6 mg by mouth daily as needed.   Dulaglutide (TRULICITY) 0.75 MG/0.5ML SOPN Inject 0.75 mg into the skin once a week.   empagliflozin (JARDIANCE) 10 MG TABS tablet Take 1 tablet (10 mg total) by mouth daily before breakfast.    famotidine (PEPCID) 20 MG tablet Take 1 tablet (20 mg total) by mouth 2 (two) times daily.   folic acid (FOLVITE) 1 MG tablet Take 1 tablet (1 mg total) by mouth daily.   gabapentin (NEURONTIN) 400 MG capsule Take 1 capsule (400 mg total) by mouth in the morning AND 1 capsule (400 mg total) daily in the afternoon AND 2 capsules (800 mg total) before bedtime.   glucose blood (ACCU-CHEK GUIDE) test strip Use as instructed   losartan (COZAAR) 100 MG tablet Take 1 tablet (100 mg total) by mouth daily.   magnesium oxide (MAG-OX) 400 (240 Mg) MG tablet Take 1 tablet (400 mg total) by mouth daily.   omeprazole (PRILOSEC) 20 MG capsule Take 1 capsule (20 mg total) by mouth daily.   ondansetron (ZOFRAN) 4 MG tablet Take 1 tablet (4 mg total) by mouth every 8 (eight) hours as needed for nausea or vomiting.   rosuvastatin (CRESTOR) 20 MG tablet Take 1 tablet (20 mg total) by mouth daily.   Vitamin D, Ergocalciferol, (DRISDOL) 1.25 MG (50000 UNIT) CAPS capsule Take 1 capsule (50,000 Units total) by mouth every 7 (seven) days.   No facility-administered encounter medications on file as of 09/03/2022.  Allergies (verified) Ciprofloxacin, Morphine and related, Penicillins, Codeine, Lisinopril, and Feraheme [ferumoxytol]   History: Past Medical History:  Diagnosis Date   Allergic rhinitis    Anemia, iron deficiency 05/03/2011   2/2 GI AVMs - recieves iron infusions as well as periodic red blood cell transfusions   Anxiety    Arteriovenous malformation of duodenum 02/25/2015   CARPAL TUNNEL RELEASE, HX OF 07/22/2008   Chest pain 10/28/2019   Colon polyps    Diabetes mellitus without complication    type 2   Difficulty sleeping    takes trazadone for sleep   Diverticulosis    Electrolyte abnormality 01/25/2021   Fall 01/03/2020   GERD (gastroesophageal reflux disease)    GI bleed 07/07/2020   Hepatitis C    in SVR with Harvoni 12/2014-02/2015.    History of hernia repair 08/18/2012   History of  porphyria 11/30/2014   Porphyria cutanea tarda in setting of chronic Hepatitis C, frequent  IV iron infusions, and alcohol abuse    Hypertension    Hypocalcemia 10/28/2019   Hypocalcemia 01/19/2021   Hypokalemia 09/27/2014   Hypokalemia 01/19/2021   Hypomagnesemia 01/19/2021   Kidney disease 02/04/2019   Peripheral vascular disease    Prolonged QT interval 01/19/2021   Stroke 2010   no residual deficits   Substance use disorder    cocaine   Tachycardia 07/10/2017   Trichomonas vaginitis 04/16/2019   Past Surgical History:  Procedure Laterality Date   ABDOMINAL HYSTERECTOMY     APPLICATION OF WOUND VAC N/A 06/21/2014   Procedure: APPLICATION OF WOUND VAC;  Surgeon: Abigail Miyamoto, MD;  Location: [facility];  Service: General;  Laterality: N/A;   CARPAL TUNNEL RELEASE     rt hand   COLONOSCOPY WITH PROPOFOL N/A 03/17/2018   Procedure: COLONOSCOPY WITH PROPOFOL;  Surgeon: Sherrilyn Rist, MD;  Location: [facility];  Service: Gastroenterology;  Laterality: N/A;   ENTEROSCOPY N/A 12/04/2012   Procedure: ENTEROSCOPY;  Surgeon: Theda Belfast, MD;  Location: [facility];  Service: Endoscopy;  Laterality: N/A;   ENTEROSCOPY N/A 12/18/2012   Procedure: ENTEROSCOPY;  Surgeon: Theda Belfast, MD;  [facility]: [facility];  Service: Endoscopy;  Laterality: N/A;   ENTEROSCOPY N/A 02/25/2015   Procedure: ENTEROSCOPY;  Surgeon: Louis Meckel, MD;  Location: [facility]COPY;  Service: Endoscopy;  Laterality: N/A;   ENTEROSCOPY N/A 10/29/2017   Procedure: ENTEROSCOPY;  Surgeon: Sherrilyn Rist, MD;  [facility]: [facility];  Service: Gastroenterology;  Laterality: N/A;   ENTEROSCOPY N/A 07/08/2020   Procedure: ENTEROSCOPY;  Surgeon: Rachael Fee, MD;  [facility]: [facility];  Service: Endoscopy;  Laterality: N/A;   ESOPHAGOGASTRODUODENOSCOPY  05/05/2011   Procedure: ESOPHAGOGASTRODUODENOSCOPY (EGD);  Surgeon: Erick Blinks, MD;  Location: [facility];  Service: Gastroenterology;  Laterality: N/A;  Dr. Rhea Belton  will do procedure for Dr. [name] Saturday.   ESOPHAGOGASTRODUODENOSCOPY  05/08/2011   Procedure: ESOPHAGOGASTRODUODENOSCOPY (EGD);  Surgeon: Theda Belfast;  [facility];  Service: Endoscopy;  Laterality: N/A;   ESOPHAGOGASTRODUODENOSCOPY  06/07/2011   Procedure: ESOPHAGOGASTRODUODENOSCOPY (EGD);  Surgeon: Theda Belfast, MD;  Location: [facility];  Service: Endoscopy;  Laterality: N/A;   ESOPHAGOGASTRODUODENOSCOPY  12/20/2011   Procedure: ESOPHAGOGASTRODUODENOSCOPY (EGD);  Surgeon: Theda Belfast, MD;  Location: [facility];  Service: Endoscopy;  Laterality: N/A;   EYE SURGERY Bilateral    cataracts   FLEXIBLE SIGMOIDOSCOPY  12/21/2011   Procedure: FLEXIBLE SIGMOIDOSCOPY;  Surgeon: Theda Belfast, MD;  [facility];  Service: Endoscopy;  Laterality:  N/A;   HOT HEMOSTASIS  06/07/2011   Procedure: HOT HEMOSTASIS (ARGON PLASMA COAGULATION/BICAP);  Surgeon: Theda Belfast, MD;  Location: [facility];  Service: Endoscopy;  Laterality: N/A;   HOT HEMOSTASIS N/A 07/08/2020   Procedure: HOT HEMOSTASIS (ARGON PLASMA COAGULATION/BICAP);  Surgeon: Rachael Fee, MD;  Location: [facility];  Service: Endoscopy;  Laterality: N/A;   INCISIONAL HERNIA REPAIR N/A 06/01/2014   Procedure: ATTEMPTED LAPAROSCOPIC AND OPEN INCISIONAL HERNIA REPAIR WITH MESH;  Surgeon: Abigail Miyamoto, MD;  Location: [facility] OR;  Service: General;  Laterality: N/A;   INSERTION OF MESH N/A 06/01/2014   Procedure: INSERTION OF MESH;  Surgeon: Abigail Miyamoto, MD;  [facility];  Service: General;  Laterality: N/A;   LAPAROTOMY  02/16/2012   Procedure: EXPLORATORY LAPAROTOMY;  Surgeon: Mariella Saa, MD;  [facility]: [facility];  Service: General;  Laterality: N/A;  oversewing of anastomotic leak and rigid sigmoidoscopy   LAPAROTOMY N/A 06/21/2014   Procedure: ABDOMINAL WOUND EXPLORATION;  Surgeon: Abigail Miyamoto, MD;  Location: [facility];  Service: General;  Laterality: N/A;   LEG AMPUTATION ABOVE KNEE     left    PARTIAL COLECTOMY  02/15/2012   Procedure: PARTIAL COLECTOMY;  Surgeon: Shelly Rubenstein, MD;  [facility];  Service: General;  Laterality: N/A;   SUBMUCOSAL INJECTION  07/08/2020   Procedure: SUBMUCOSAL INJECTION;  Surgeon: Rachael Fee, MD;  [facility];  Service: Endoscopy;;   TONSILLECTOMY     WRIST FUSION Right 04/27/2021   Procedure: SCAPHOID EXCISION  WITH 4 BONE FUSION RIGHT WRIST;  Surgeon: Betha Loa, MD;  Location: [facility];  Service: Orthopedics;  Laterality: Right;   Family History  Problem Relation Age of Onset   Cancer Father        unknown   Hypertension Mother    Diverticulitis Mother    Social History   Socioeconomic History   Marital status: Widowed    Spouse name: Not on file   Number of children: 3   Years of education: Not on file   Highest education level: Not on file  Occupational History   Occupation: Retired  Tobacco Use   Smoking status: Some Days    Packs/day: 0.50    Years: 17.00    Additional pack years: 0.00    Total pack years: 8.50    Types: Cigarettes   Smokeless tobacco: Never   Tobacco comments:    1 pack last 1 month  Vaping Use   Vaping Use: Never used  Substance and Sexual Activity   Alcohol use: Yes    Comment: few drinks a week   Drug use: Yes    Types: Cocaine, Marijuana    Comment: Sometimes. last used Marijuana 04/22/21; last used cocaine 04/19/21   Sexual activity: Yes    Partners: Male    Birth control/protection: Post-menopausal  Other Topics Concern   Not on file  Social History Narrative   Not on file   Social Determinants of Health   Financial Resource Strain: Not on file  Food Insecurity: Food Insecurity Present (06/01/2022)   Hunger Vital Sign    Worried About Running Out of Food in the Last Year: Sometimes true    Ran Out of Food in the Last Year: Never true  Transportation Needs: No Transportation Needs (06/01/2022)   PRAPARE - Administrator, Civil Service  (Medical): No    Lack of Transportation (Non-Medical): No  Recent Concern: Transportation Needs - Unmet Transportation Needs (  05/02/2022)   PRAPARE - Administrator, Civil Service (Medical): Yes    Lack of Transportation (Non-Medical): Yes  Physical Activity: Not on file  Stress: Not on file  Social Connections: Moderately Isolated (06/01/2022)   Social Connection and Isolation Panel [NHANES]    Frequency of Communication with Friends and Family: More than three times a week    Frequency of Social Gatherings with Friends and Family: More than three times a week    Attends Religious Services: More than 4 times per year    Active Member of [employer] or Organizations: No    Attends Banker Meetings: Never    Marital Status: Widowed    Tobacco Counseling Ready to quit: Not Answered Counseling given: Not Answered Tobacco comments: 1 pack last 1 month   Clinical Intake:  Pre-visit preparation completed: Yes  Pain : No/denies pain Pain Score: 0-No pain     BMI - recorded: 27.22 Nutritional Status: BMI 25 -29 Overweight Nutritional Risks: None Diabetes: Yes CBG done?: No Did pt. bring in CBG monitor from home?: No  How often do you need to have someone help you when you read instructions, pamphlets, or other written materials from your doctor or pharmacy?: 2 - Rarely What is the last grade level you completed in school?: 12th  Diabetic?yes-  Interpreter Needed?: No  Information entered by :: [email or web address] 09/03/2022   Activities of Daily Living    09/03/2022    2:17 PM 09/03/2022    9:18 AM  In your present state of health, do you have any difficulty performing the following activities:  Hearing? 0 0  Vision? 0 0  Difficulty concentrating or making decisions? 0   Walking or climbing stairs? 1 1  Comment  Amputations wears prosthesis  Dressing or bathing? 0 0  Doing errands, shopping? 1 1  Comment  Transportation    Patient Care Team: Lyndle Herrlich, MD as PCP - General Ernesto Rutherford, MD as Consulting Physician (Ophthalmology) Abigail Miyamoto, MD as Consulting Physician (General Surgery) Malachy Mood, MD as Consulting Physician (Hematology) Comer, Belia Heman, MD as Consulting Physician (Infectious Diseases) Danis, Andreas Blower, MD as Consulting Physician (Gastroenterology)  Indicate any recent Medical Services you may have received from other than [facility] providers in the past year (date may be approximate).     Assessment:   This is a routine wellness examination for [facility].  Hearing/Vision screen No results found.  Dietary issues and exercise activities discussed:     Goals Addressed   None   Depression Screen    09/03/2022    9:19 AM 07/31/2022    9:12 AM 06/01/2022    8:44 AM 03/20/2022    9:30 AM 03/07/2022    9:28 AM 11/21/2021    8:41 AM 07/27/2021    3:36 PM  PHQ 2/9 Scores  PHQ - 2 Score 0 0 0 2 5 3 2   PHQ- 9 Score   4 7 20 17 7     Fall Risk    09/03/2022    2:17 PM 09/03/2022    9:18 AM 06/01/2022    8:44 AM 03/07/2022    8:52 AM 11/21/2021    8:40 AM  Fall Risk   Falls in the past year? 0 0 1 0 0  Number falls in past yr: 0 0 1 1   Injury with Fall? 0 0 0 0   Risk for fall due to : Impaired balance/gait;Impaired mobility;History of fall(s) Impaired balance/gait;Impaired mobility;History of  fall(s) Impaired balance/gait;Impaired mobility Impaired balance/gait;Impaired mobility;History of fall(s) Impaired balance/gait  Follow up Falls evaluation completed Falls evaluation completed;Falls prevention discussed Falls evaluation completed Falls evaluation completed Falls evaluation completed    FALL RISK PREVENTION PERTAINING TO THE HOME:  Any stairs in or around the home? No  If so, are there any without handrails?  N/a Home free of loose throw rugs in walkways, pet beds, electrical cords, etc? No  Adequate lighting in your home to reduce risk of falls? Yes   ASSISTIVE DEVICES UTILIZED TO PREVENT  FALLS:  Life alert? Yes  Use of a cane, walker or w/c?  Yes Grab bars in the bathroom? Yes  Shower chair or bench in shower? Yes  Elevated toilet seat or a handicapped toilet? No -but has requested one   TIMED UP AND GO:  Was the test performed? No .  Length of time to ambulate 10 feet: 0 sec.    Cognitive Function:        09/03/2022    2:18 PM  6CIT Screen  What Year? 0 points  What month? 0 points  What time? 0 points  Count back from 20 0 points  Months in reverse 0 points  Repeat phrase 0 points  Total Score 0 points    Immunizations Immunization History  Administered Date(s) Administered   Fluad Quad(high Dose 65+) 02/25/2020   Hepatitis A, Adult 01/28/2015, 09/20/2015   Hepatitis B, ADULT 03/15/2015, 09/20/2015   Hepatitis B, PED/ADOLESCENT 01/28/2015   Influenza Split 02/17/2012   Influenza,inj,Quad PF,6+ Mos 02/11/2013, 03/19/2014, 02/22/2015, 03/08/2016, 03/13/2017, 02/26/2018, 02/04/2019, 03/30/2021, 03/07/2022   PFIZER(Purple Top)SARS-COV-2 Vaccination 08/08/2019, 09/01/2019, 03/03/2020   PNEUMOCOCCAL CONJUGATE-20 03/07/2022   Pneumococcal Conjugate-13 02/04/2019   Pneumococcal Polysaccharide-23 05/05/2011, 09/04/2016   Tdap 04/01/2015    TDAP status: Up to date  Flu Vaccine status: Up to date  Pneumococcal vaccine status: Up to date  Covid-19 vaccine status: Completed vaccines  Qualifies for Shingles Vaccine? Yes   Zostavax completed No   Shingrix Completed?: No.    Education has been provided regarding the importance of this vaccine. Patient has been advised to call insurance company to determine out of pocket expense if they have not yet received this vaccine. Advised may also receive vaccine at local pharmacy or Health Dept. Verbalized acceptance and understanding.  Screening Tests Health Maintenance  Topic Date Due   DEXA SCAN  Never done   COVID-19 Vaccine (4 - 2023-24 season) 01/26/2022   Zoster Vaccines- Shingrix (1 of 2) 10/31/2022  (Originally 12/01/1969)   INFLUENZA VACCINE  12/27/2022   HEMOGLOBIN A1C  03/05/2023   FOOT EXAM  03/08/2023   LIPID PANEL  03/08/2023   OPHTHALMOLOGY EXAM  03/28/2023   Diabetic kidney evaluation - Urine ACR  07/31/2023   Diabetic kidney evaluation - eGFR measurement  08/02/2023   Medicare Annual Wellness (AWV)  09/03/2023   MAMMOGRAM  05/01/2024   DTaP/Tdap/Td (2 - Td or Tdap) 03/31/2025   COLONOSCOPY (Pts 45-3937yrs Insurance coverage will need to be confirmed)  03/17/2028   Pneumonia Vaccine 4765+ Years old  Completed   Hepatitis C Screening  Completed   HPV VACCINES  Aged Out    Health Maintenance  Health Maintenance Due  Topic Date Due   DEXA SCAN  Never done   COVID-19 Vaccine (4 - 2023-24 season) 01/26/2022    Colorectal cancer screening: Type of screening: Colonoscopy. Completed 03/17/2018. Repeat every 10 years  Mammogram status: Completed 05/01/2022. Repeat every year every two  Bone Density  status: Ordered 07/31/2022. Pt provided with contact info and advised to call to schedule appt.  Lung Cancer Screening: (Low Dose CT Chest recommended if Age 59-80 years, 30 pack-year currently smoking OR have quit w/in 15years.) does qualify.   Lung Cancer Screening Referral: defer to pcp  Additional Screening:  Hepatitis C Screening: does not qualify; Completed 03/06/2022  Vision Screening: Recommended annual ophthalmology exams for early detection of glaucoma and other disorders of the eye. Is the patient up to date with their annual eye exam?  Yes  Who is the provider or what is the name of the office in which the patient attends annual eye exams? [facility] If pt is not established with a provider, would they like to be referred to a provider to establish care? No .   Dental Screening: Recommended annual dental exams for proper oral hygiene  Community Resource Referral / Chronic Care Management: CRR required this visit?  No   CCM required this visit?  No      Plan:      I have personally reviewed and noted the following in the patient's chart:   Medical and social history Use of alcohol, tobacco or illicit drugs  Current medications and supplements including opioid prescriptions. Patient is not currently taking opioid prescriptions. Functional ability and status Nutritional status Physical activity Advanced directives List of other physicians Hospitalizations, surgeries, and ER visits in previous 12 months Vitals Screenings to include cognitive, depression, and falls Referrals and appointments  In addition, I have reviewed and discussed with patient certain preventive protocols, quality metrics, and best practice recommendations. A written personalized care plan for preventive services as well as general preventive health recommendations were provided to patient.     Joslyn Devon, [place]   09/03/2022   Nurse Notes: face to face  Ms. Roxan Hockey , Thank you for taking time to come for your Medicare Wellness Visit. I appreciate your ongoing commitment to your health goals. Please review the following plan we discussed and let me know if I can assist you in the future.   These are the goals we discussed:  Goals      Blood Pressure < 140/90     HEMOGLOBIN A1C < 6.5     Prevent Falls        This is a list of the screening recommended for you and due dates:  Health Maintenance  Topic Date Due   DEXA scan (bone density measurement)  Never done   COVID-19 Vaccine (4 - 2023-24 season) 01/26/2022   Zoster (Shingles) Vaccine (1 of 2) 10/31/2022*   Flu Shot  12/27/2022   Hemoglobin A1C  03/05/2023   Complete foot exam   03/08/2023   Lipid (cholesterol) test  03/08/2023   Eye exam for diabetics  03/28/2023   Yearly kidney health urinalysis for diabetes  07/31/2023   Yearly kidney function blood test for diabetes  08/02/2023   Medicare Annual Wellness Visit  09/03/2023   Mammogram  05/01/2024   DTaP/Tdap/Td vaccine (2 - Td or Tdap)  03/31/2025   Colon Cancer Screening  03/17/2028   Pneumonia Vaccine  Completed   Hepatitis C Screening: USPSTF Recommendation to screen - Ages 9-79 yo.  Completed   HPV Vaccine  Aged Out  *Topic was postponed. The date shown is not the original due date.

## 2022-09-03 NOTE — Assessment & Plan Note (Signed)
Blood pressure well-managed on current regimen of amlodipine 10, Coreg 6.25 twice daily, losartan 100 mg daily. No concerning symptoms. Refilled today as needed.

## 2022-09-03 NOTE — Progress Notes (Signed)
Attempted to call patient multiple times for virtual visit without success. She has upcoming [facility] visit on 4-8.

## 2022-09-03 NOTE — Assessment & Plan Note (Signed)
Will recheck A1c today to see how it looks off metformin, continue trulicity and jardiance

## 2022-09-03 NOTE — Assessment & Plan Note (Signed)
She is not sure if she completed the supplementation plan for vitamin D there without during last visit.  Will recheck today and follow-up next appropriate.  Anticipate she will likely need high-dose vitamin D supplementation.  Addendum: Vit D still < 10, resent 50000 units weekly supplement for 8 weeks. Called ms Archuletta to let her know the plan and she said she will go pick it up

## 2022-09-03 NOTE — Assessment & Plan Note (Addendum)
Asking for elevated commode seat as she has difficulty sitting on her toilet now

## 2022-09-03 NOTE — Assessment & Plan Note (Signed)
Symptoms controlled, refilled pepcid

## 2022-09-03 NOTE — Assessment & Plan Note (Signed)
Following with [facility], no concerns today.

## 2022-09-04 LAB — VITAMIN D 25 HYDROXY (VIT D DEFICIENCY, FRACTURES): Vit D, 25-Hydroxy: 9.7 ng/mL — ABNORMAL LOW (ref 30.0–100.0)

## 2022-09-05 ENCOUNTER — Other Ambulatory Visit (HOSPITAL_COMMUNITY): Payer: Self-pay

## 2022-09-05 ENCOUNTER — Encounter: Payer: Self-pay | Admitting: Hematology

## 2022-09-05 MED ORDER — VITAMIN D (ERGOCALCIFEROL) 1.25 MG (50000 UNIT) PO CAPS
50000.0000 [IU] | ORAL_CAPSULE | ORAL | 0 refills | Status: DC
  Filled 2022-09-05: qty 8, 56d supply, fill #0

## 2022-09-05 NOTE — Addendum Note (Signed)
Addended byLyndle Herrlich on: 09/05/2022 11:02 AM   Modules accepted: Orders

## 2022-09-06 NOTE — Progress Notes (Signed)
Internal Medicine Clinic Attending  Case discussed with the resident at the time of the visit.  We reviewed the resident's history and exam and pertinent patient test results.  I agree with the assessment, diagnosis, and plan of care documented in the resident's note.  

## 2022-09-13 NOTE — Progress Notes (Signed)
Internal Medicine Clinic Attending  I reviewed the AWV findings.  I agree with the assessment, diagnosis, and plan of care documented in the AWV note.     

## 2022-09-13 NOTE — Assessment & Plan Note (Signed)
Patient has difficulty getting to and using bedside commode now that she is s\p L AKA. Requesting elevated commode seat due to difficulty lowering herself onto commode and incontinence supplies. I think she would benefit from both.

## 2022-09-20 ENCOUNTER — Other Ambulatory Visit (HOSPITAL_COMMUNITY): Payer: Self-pay

## 2022-09-27 ENCOUNTER — Other Ambulatory Visit: Payer: Self-pay

## 2022-09-27 ENCOUNTER — Inpatient Hospital Stay (HOSPITAL_BASED_OUTPATIENT_CLINIC_OR_DEPARTMENT_OTHER): Payer: 59 | Admitting: Hematology

## 2022-09-27 ENCOUNTER — Encounter: Payer: Self-pay | Admitting: Hematology

## 2022-09-27 ENCOUNTER — Inpatient Hospital Stay: Payer: 59 | Attending: Hematology

## 2022-09-27 VITALS — BP 114/63 | HR 100 | Temp 98.8°F | Resp 18 | Ht 63.0 in | Wt 152.9 lb

## 2022-09-27 DIAGNOSIS — E1136 Type 2 diabetes mellitus with diabetic cataract: Secondary | ICD-10-CM | POA: Insufficient documentation

## 2022-09-27 DIAGNOSIS — Z8673 Personal history of transient ischemic attack (TIA), and cerebral infarction without residual deficits: Secondary | ICD-10-CM | POA: Diagnosis not present

## 2022-09-27 DIAGNOSIS — Z7984 Long term (current) use of oral hypoglycemic drugs: Secondary | ICD-10-CM | POA: Insufficient documentation

## 2022-09-27 DIAGNOSIS — E1151 Type 2 diabetes mellitus with diabetic peripheral angiopathy without gangrene: Secondary | ICD-10-CM | POA: Insufficient documentation

## 2022-09-27 DIAGNOSIS — F1210 Cannabis abuse, uncomplicated: Secondary | ICD-10-CM | POA: Insufficient documentation

## 2022-09-27 DIAGNOSIS — Z8619 Personal history of other infectious and parasitic diseases: Secondary | ICD-10-CM

## 2022-09-27 DIAGNOSIS — I10 Essential (primary) hypertension: Secondary | ICD-10-CM | POA: Insufficient documentation

## 2022-09-27 DIAGNOSIS — Z7985 Long-term (current) use of injectable non-insulin antidiabetic drugs: Secondary | ICD-10-CM | POA: Diagnosis not present

## 2022-09-27 DIAGNOSIS — Z79899 Other long term (current) drug therapy: Secondary | ICD-10-CM | POA: Diagnosis not present

## 2022-09-27 DIAGNOSIS — E876 Hypokalemia: Secondary | ICD-10-CM | POA: Insufficient documentation

## 2022-09-27 DIAGNOSIS — C19 Malignant neoplasm of rectosigmoid junction: Secondary | ICD-10-CM

## 2022-09-27 DIAGNOSIS — F1410 Cocaine abuse, uncomplicated: Secondary | ICD-10-CM | POA: Diagnosis not present

## 2022-09-27 DIAGNOSIS — E119 Type 2 diabetes mellitus without complications: Secondary | ICD-10-CM | POA: Diagnosis not present

## 2022-09-27 DIAGNOSIS — D500 Iron deficiency anemia secondary to blood loss (chronic): Secondary | ICD-10-CM | POA: Diagnosis present

## 2022-09-27 DIAGNOSIS — I739 Peripheral vascular disease, unspecified: Secondary | ICD-10-CM | POA: Diagnosis not present

## 2022-09-27 DIAGNOSIS — R809 Proteinuria, unspecified: Secondary | ICD-10-CM | POA: Diagnosis not present

## 2022-09-27 DIAGNOSIS — Z86010 Personal history of colonic polyps: Secondary | ICD-10-CM | POA: Diagnosis not present

## 2022-09-27 DIAGNOSIS — K922 Gastrointestinal hemorrhage, unspecified: Secondary | ICD-10-CM

## 2022-09-27 DIAGNOSIS — K219 Gastro-esophageal reflux disease without esophagitis: Secondary | ICD-10-CM | POA: Insufficient documentation

## 2022-09-27 LAB — CBC WITH DIFFERENTIAL (CANCER CENTER ONLY)
Abs Immature Granulocytes: 0.06 10*3/uL (ref 0.00–0.07)
Basophils Absolute: 0 10*3/uL (ref 0.0–0.1)
Basophils Relative: 0 %
Eosinophils Absolute: 0.1 10*3/uL (ref 0.0–0.5)
Eosinophils Relative: 1 %
HCT: 25.8 % — ABNORMAL LOW (ref 36.0–46.0)
Hemoglobin: 7.8 g/dL — ABNORMAL LOW (ref 12.0–15.0)
Immature Granulocytes: 1 %
Lymphocytes Relative: 9 %
Lymphs Abs: 0.9 10*3/uL (ref 0.7–4.0)
MCH: 27.6 pg (ref 26.0–34.0)
MCHC: 30.2 g/dL (ref 30.0–36.0)
MCV: 91.2 fL (ref 80.0–100.0)
Monocytes Absolute: 1.2 10*3/uL — ABNORMAL HIGH (ref 0.1–1.0)
Monocytes Relative: 12 %
Neutro Abs: 7.6 10*3/uL (ref 1.7–7.7)
Neutrophils Relative %: 77 %
Platelet Count: 350 10*3/uL (ref 150–400)
RBC: 2.83 MIL/uL — ABNORMAL LOW (ref 3.87–5.11)
RDW: 14.7 % (ref 11.5–15.5)
WBC Count: 9.9 10*3/uL (ref 4.0–10.5)
nRBC: 0 % (ref 0.0–0.2)

## 2022-09-27 LAB — CMP (CANCER CENTER ONLY)
ALT: 7 U/L (ref 0–44)
AST: 13 U/L — ABNORMAL LOW (ref 15–41)
Albumin: 4 g/dL (ref 3.5–5.0)
Alkaline Phosphatase: 87 U/L (ref 38–126)
Anion gap: 6 (ref 5–15)
BUN: 20 mg/dL (ref 8–23)
CO2: 28 mmol/L (ref 22–32)
Calcium: 8.2 mg/dL — ABNORMAL LOW (ref 8.9–10.3)
Chloride: 107 mmol/L (ref 98–111)
Creatinine: 1.93 mg/dL — ABNORMAL HIGH (ref 0.44–1.00)
GFR, Estimated: 28 mL/min — ABNORMAL LOW (ref >60)
Glucose, Bld: 131 mg/dL — ABNORMAL HIGH (ref 70–99)
Potassium: 4 mmol/L (ref 3.5–5.1)
Sodium: 141 mmol/L (ref 135–145)
Total Bilirubin: 0.2 mg/dL — ABNORMAL LOW (ref 0.3–1.2)
Total Protein: 6.8 g/dL (ref 6.5–8.1)

## 2022-09-27 LAB — FERRITIN: Ferritin: 6 ng/mL — ABNORMAL LOW (ref 11–307)

## 2022-09-27 LAB — TOTAL PROTEIN, URINE DIPSTICK: Protein, ur: 100 mg/dL — AB

## 2022-09-27 NOTE — Assessment & Plan Note (Signed)
secondary to GI blood loss, and Anemia of Chronic Disease -longstanding history of intermittent anemia dating back at least 2008 when Hg was 10. -h/o multiple GI bleeding in 2016, required blood transfusion, hospitalization and IV Feraheme -EGD and colonoscopy on 10/29/17 and 03/17/18 with Dr. Myrtie Neither, which were normal. 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 -Due to recurrent anemia requiring IV Feraheme and blood transfusions, she started bevacizumab for GI bleeding on 08/10/20. She responded very well with resolved anemia and less need for iv iron. This has been held intermittently due to elevated urine protein. Last dose given 04/03/22. -She is clinically doing well, no melena or other GI bleeding lately.  Lab reviewed, hemoglobin 11.9, iron level still pending. -Will continue IV iron and bevacizumab as needed -She has cut down her marijuana and cocaine use, I again encouraged her to completely stop.

## 2022-09-27 NOTE — Progress Notes (Signed)
[facility]   Telephone:([phone] Fax:([phone]   Clinic Follow up Note   Patient Care Team: Lyndle Herrlich, MD as PCP - Keturah Barre, MD as Consulting Physician (Ophthalmology) Abigail Miyamoto, MD as Consulting Physician (General Surgery) Malachy Mood, MD as Consulting Physician (Hematology) Comer, Belia Heman, MD as Consulting Physician (Infectious Diseases) Sherrilyn Rist, MD as Consulting Physician (Gastroenterology)  Date of Service:  09/27/2022  CHIEF COMPLAINT: f/u of  anemia    CURRENT THERAPY:  Iv feraheme if ferritin<20 Bevacizumab as needed (worsening anemia or clinical GI bleeding), intermittently given proteinuria    ASSESSMENT:  [name] is a 72 y.o. female with   Iron deficiency anemia due to chronic blood loss secondary to GI blood loss, and Anemia of Chronic Disease -longstanding history of intermittent anemia dating back at least 2008 when Hg was 10. -h/o multiple GI bleeding in 2016, required blood transfusion, hospitalization and IV Feraheme -EGD and colonoscopy on 10/29/17 and 03/17/18 with Dr. Myrtie Neither, which were normal. 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 -Due to recurrent anemia requiring IV Feraheme and blood transfusions, she started bevacizumab for GI bleeding on 08/10/20. She responded very well with resolved anemia and less need for iv iron. This has been held intermittently due to elevated urine protein. Last dose given 04/03/22. -She is clinically doing well, no melena or other GI bleeding lately.  Lab reviewed, hemoglobin 11.9, iron level still pending. -Will continue IV iron and bevacizumab as needed -She has cut down her marijuana and cocaine use, I again encouraged her to completely stop. -Lab reviewed, anemia is getting worse hemoglobin 7.8 today, will restart bevacizumab within the next week, and continue every 2 weeks for additional 2-3 doses. -Ferritin level is still  pending, it was previously low on last lab, I will schedule II doses of IV Feraheme in the next 2 weeks    PLAN: -lab reviewed -plan to give Beva and IV Iron within 1 week -lab/f/u in 4 weeks  INTERVAL HISTORY:  [name] is here for a follow up of  anemia . She was last seen by me on 07/05/2022. She presents to the clinic alone.Pt state that her energy level is fine. Pt denies having any bleeding. Pt still partaking in [place] usage, but not as much.She also reports she is still -partaking in Crack Cocaine, but wants to stop.      All other systems were reviewed with the patient and are negative.  MEDICAL HISTORY:  Past Medical History:  Diagnosis Date   Allergic rhinitis    Anemia, iron deficiency 05/03/2011   2/2 GI AVMs - recieves iron infusions as well as periodic red blood cell transfusions   Anxiety    Arteriovenous malformation of duodenum 02/25/2015   CARPAL TUNNEL RELEASE, HX OF 07/22/2008   Chest pain 10/28/2019   Colon polyps    Diabetes mellitus without complication (HCC)    type 2   Difficulty sleeping    takes trazadone for sleep   Diverticulosis    Electrolyte abnormality 01/25/2021   Fall 01/03/2020   GERD (gastroesophageal reflux disease)    GI bleed 07/07/2020   Hepatitis C    in SVR with Harvoni 12/2014-02/2015.    History of hernia repair 08/18/2012   History of porphyria 11/30/2014   Porphyria cutanea tarda in setting of chronic Hepatitis C, frequent  IV iron infusions, and alcohol abuse    Hypertension  Hypocalcemia 10/28/2019   Hypocalcemia 01/19/2021   Hypokalemia 09/27/2014   Hypokalemia 01/19/2021   Hypomagnesemia 01/19/2021   Kidney disease 02/04/2019   Peripheral vascular disease (HCC)    Prolonged QT interval 01/19/2021   Stroke (HCC) 2010   no residual deficits   Substance use disorder    cocaine   Tachycardia 07/10/2017   Trichomonas vaginitis 04/16/2019    SURGICAL HISTORY: Past Surgical History:  Procedure Laterality Date   ABDOMINAL  HYSTERECTOMY     APPLICATION OF WOUND VAC N/A 06/21/2014   Procedure: APPLICATION OF WOUND VAC;  Surgeon: Abigail Miyamoto, MD;  [facility]: [facility];  Service: General;  Laterality: N/A;   CARPAL TUNNEL RELEASE     rt hand   COLONOSCOPY WITH PROPOFOL N/A 03/17/2018   Procedure: COLONOSCOPY WITH PROPOFOL;  Surgeon: Sherrilyn Rist, MD;  Location: [facility];  Service: Gastroenterology;  Laterality: N/A;   ENTEROSCOPY N/A 12/04/2012   Procedure: ENTEROSCOPY;  Surgeon: Theda Belfast, MD;  Location: [facility];  Service: Endoscopy;  Laterality: N/A;   ENTEROSCOPY N/A 12/18/2012   Procedure: ENTEROSCOPY;  Surgeon: Theda Belfast, MD;  [facility]: [facility];  Service: Endoscopy;  Laterality: N/A;   ENTEROSCOPY N/A 02/25/2015   Procedure: ENTEROSCOPY;  Surgeon: Louis Meckel, MD;  Location: [facility];  Service: Endoscopy;  Laterality: N/A;   ENTEROSCOPY N/A 10/29/2017   Procedure: ENTEROSCOPY;  Surgeon: Sherrilyn Rist, MD;  [facility];  Service: Gastroenterology;  Laterality: N/A;   ENTEROSCOPY N/A 07/08/2020   Procedure: ENTEROSCOPY;  Surgeon: Rachael Fee, MD;  [facility];  Service: Endoscopy;  Laterality: N/A;   ESOPHAGOGASTRODUODENOSCOPY  05/05/2011   Procedure: ESOPHAGOGASTRODUODENOSCOPY (EGD);  Surgeon: Erick Blinks, MD;  Location: [facility];  Service: Gastroenterology;  Laterality: N/A;  Dr. Rhea Belton will do procedure for Dr. [name] Saturday.   ESOPHAGOGASTRODUODENOSCOPY  05/08/2011   Procedure: ESOPHAGOGASTRODUODENOSCOPY (EGD);  Surgeon: Theda Belfast;  [facility];  Service: Endoscopy;  Laterality: N/A;   ESOPHAGOGASTRODUODENOSCOPY  06/07/2011   Procedure: ESOPHAGOGASTRODUODENOSCOPY (EGD);  Surgeon: Theda Belfast, MD;  Location: [facility];  Service: Endoscopy;  Laterality: N/A;   ESOPHAGOGASTRODUODENOSCOPY  12/20/2011   Procedure: ESOPHAGOGASTRODUODENOSCOPY (EGD);  Surgeon: Theda Belfast, MD;  Location: [facility];  Service: Endoscopy;   Laterality: N/A;   EYE SURGERY Bilateral    cataracts   FLEXIBLE SIGMOIDOSCOPY  12/21/2011   Procedure: FLEXIBLE SIGMOIDOSCOPY;  Surgeon: Theda Belfast, MD;  [facility];  Service: Endoscopy;  Laterality: N/A;   HOT HEMOSTASIS  06/07/2011   Procedure: HOT HEMOSTASIS (ARGON PLASMA COAGULATION/BICAP);  Surgeon: Theda Belfast, MD;  Location: [facility];  Service: Endoscopy;  Laterality: N/A;   HOT HEMOSTASIS N/A 07/08/2020   Procedure: HOT HEMOSTASIS (ARGON PLASMA COAGULATION/BICAP);  Surgeon: Rachael Fee, MD;  Location: [facility];  Service: Endoscopy;  Laterality: N/A;   INCISIONAL HERNIA REPAIR N/A 06/01/2014   Procedure: ATTEMPTED LAPAROSCOPIC AND OPEN INCISIONAL HERNIA REPAIR WITH MESH;  Surgeon: Abigail Miyamoto, MD;  Location: [facility];  Service: General;  Laterality: N/A;   INSERTION OF MESH N/A 06/01/2014   Procedure: INSERTION OF MESH;  Surgeon: Abigail Miyamoto, MD;  [facility];  Service: General;  Laterality: N/A;   LAPAROTOMY  02/16/2012   Procedure: EXPLORATORY LAPAROTOMY;  Surgeon: Mariella Saa, MD;  [facility];  Service: General;  Laterality: N/A;  oversewing of anastomotic leak and rigid sigmoidoscopy   LAPAROTOMY N/A 06/21/2014   Procedure: ABDOMINAL WOUND EXPLORATION;  Surgeon: Abigail Miyamoto,  MD;  [facility]: [facility];  Service: General;  Laterality: N/A;   LEG AMPUTATION ABOVE KNEE     left   PARTIAL COLECTOMY  02/15/2012   Procedure: PARTIAL COLECTOMY;  Surgeon: Shelly Rubenstein, MD;  [facility];  Service: General;  Laterality: N/A;   SUBMUCOSAL INJECTION  07/08/2020   Procedure: SUBMUCOSAL INJECTION;  Surgeon: Rachael Fee, MD;  [facility];  Service: Endoscopy;;   TONSILLECTOMY     WRIST FUSION Right 04/27/2021   Procedure: SCAPHOID EXCISION  WITH 4 BONE FUSION RIGHT WRIST;  Surgeon: Betha Loa, MD;  Location: [facility];  Service: Orthopedics;  Laterality: Right;    I have reviewed the social history and  family history with the patient and they are unchanged from previous note.  ALLERGIES:  is allergic to ciprofloxacin, morphine and related, penicillins, codeine, lisinopril, and feraheme [ferumoxytol].  MEDICATIONS:  Current Outpatient Medications  Medication Sig Dispense Refill   ACCU-CHEK FASTCLIX LANCETS MISC Use to test blood glucose 3 times daily. 100 each 11   amLODipine (NORVASC) 10 MG tablet Take 1 tablet (10 mg total) by mouth daily. 90 tablet 1   Blood Glucose Monitoring Suppl (ACCU-CHEK GUIDE) w/Device KIT 1 each by Does not apply route 3 (three) times daily. The patient is insulin requiring, ICD 10 code E11.65. The patient tests 3 times a day. 1 kit 1   calcium-vitamin D (OSCAL WITH D) 500-200 MG-UNIT tablet Take 1 tablet by mouth 2 (two) times daily. 60 tablet 0   camphor-menthol (SARNA) lotion Apply 1 Application topically as needed for itching. 222 mL 1   carvedilol (COREG) 6.25 MG tablet Take 2 tablets (12.5 mg total) by mouth daily. 180 tablet 3   colchicine 0.6 MG tablet Take 0.6 mg by mouth daily as needed.     Dulaglutide (TRULICITY) 0.75 MG/0.5ML SOPN Inject 0.75 mg into the skin once a week. 6 mL 3   empagliflozin (JARDIANCE) 10 MG TABS tablet Take 1 tablet (10 mg total) by mouth daily before breakfast. 30 tablet 0   famotidine (PEPCID) 20 MG tablet Take 1 tablet (20 mg total) by mouth 2 (two) times daily. 60 tablet 1   folic acid (FOLVITE) 1 MG tablet Take 1 tablet (1 mg total) by mouth daily. 30 tablet 2   gabapentin (NEURONTIN) 400 MG capsule Take 1 capsule (400 mg total) by mouth in the morning AND 1 capsule (400 mg total) daily in the afternoon AND 2 capsules (800 mg total) before bedtime. 360 capsule 3   glucose blood (ACCU-CHEK GUIDE) test strip Use as instructed 100 strip 3   losartan (COZAAR) 100 MG tablet Take 1 tablet (100 mg total) by mouth daily. 90 tablet 1   magnesium oxide (MAG-OX) 400 (240 Mg) MG tablet Take 1 tablet (400 mg total) by mouth daily. 30 tablet  5   omeprazole (PRILOSEC) 20 MG capsule Take 1 capsule (20 mg total) by mouth daily. 30 capsule 2   ondansetron (ZOFRAN) 4 MG tablet Take 1 tablet (4 mg total) by mouth every 8 (eight) hours as needed for nausea or vomiting. 20 tablet 0   rosuvastatin (CRESTOR) 20 MG tablet Take 1 tablet (20 mg total) by mouth daily. 90 tablet 2   Vitamin D, Ergocalciferol, (DRISDOL) 1.25 MG (50000 UNIT) CAPS capsule Take 1 capsule (50,000 Units total) by mouth every 7 (seven) days. 8 capsule 0   No current facility-administered medications for this visit.    PHYSICAL EXAMINATION: ECOG PERFORMANCE STATUS:  2 - Symptomatic, <50% confined to bed  Vitals:   09/27/22 0853  BP: 114/63  Pulse: 100  Resp: 18  Temp: 98.8 F (37.1 C)  SpO2: 97%   Wt Readings from Last 3 Encounters:  09/27/22 152 lb 14.4 oz (69.4 kg)  09/03/22 153 lb 10.6 oz (69.7 kg)  09/03/22 153 lb 9.6 oz (69.7 kg)     GENERAL:alert, no distress and comfortable SKIN: skin color normal, no rashes or significant lesions EYES: normal, Conjunctiva are pink and non-injected, sclera clear  NEURO: alert & oriented x 3 with fluent speech  LABORATORY DATA:  I have reviewed the data as listed    Latest Ref Rng & Units 09/27/2022    8:16 AM 08/02/2022    7:56 AM 07/05/2022    9:33 AM  CBC  WBC 4.0 - 10.5 K/uL 9.9  11.3  5.3   Hemoglobin 12.0 - 15.0 g/dL 7.8  16.1  09.6   Hematocrit 36.0 - 46.0 % 25.8  31.2  35.5   Platelets 150 - 400 K/uL 350  271  229         Latest Ref Rng & Units 09/27/2022    8:16 AM 08/02/2022    7:56 AM 07/31/2022    9:40 AM  CMP  Glucose 70 - 99 mg/dL 045  409  92   BUN 8 - 23 mg/dL 20  25  28    Creatinine 0.44 - 1.00 mg/dL 8.11  9.14  7.82   Sodium 135 - 145 mmol/L 141  140  141   Potassium 3.5 - 5.1 mmol/L 4.0  4.2  4.3   Chloride 98 - 111 mmol/L 107  109  105   CO2 22 - 32 mmol/L 28  25  21    Calcium 8.9 - 10.3 mg/dL 8.2  9.0  9.3   Total Protein 6.5 - 8.1 g/dL 6.8  6.9  6.8   Total Bilirubin 0.3 - 1.2 mg/dL  0.2  0.2  <9.5   Alkaline Phos 38 - 126 U/L 87  96  116   AST 15 - 41 U/L 13  23  17    ALT 0 - 44 U/L 7  19  11        RADIOGRAPHIC STUDIES: I have personally reviewed the radiological images as listed and agreed with the findings in the report. No results found.    No orders of the defined types were placed in this encounter.  All questions were answered. The patient knows to call the clinic with any problems, questions or concerns. No barriers to learning was detected. The total time spent in the appointment was 25 minutes.     Malachy Mood, MD 09/27/2022   Carolin Coy, CMA, am acting as scribe for Malachy Mood, MD.   I have reviewed the above documentation for accuracy and completeness, and I agree with the above.

## 2022-09-28 ENCOUNTER — Other Ambulatory Visit: Payer: Self-pay | Admitting: Hematology

## 2022-09-28 ENCOUNTER — Inpatient Hospital Stay: Payer: 59

## 2022-09-28 VITALS — BP 138/80 | HR 88 | Temp 98.7°F | Resp 18

## 2022-09-28 DIAGNOSIS — D500 Iron deficiency anemia secondary to blood loss (chronic): Secondary | ICD-10-CM

## 2022-09-28 DIAGNOSIS — K922 Gastrointestinal hemorrhage, unspecified: Secondary | ICD-10-CM

## 2022-09-28 MED ORDER — SODIUM CHLORIDE 0.9 % IV SOLN
5.0000 mg/kg | Freq: Once | INTRAVENOUS | Status: AC
  Administered 2022-09-28: 350 mg via INTRAVENOUS
  Filled 2022-09-28: qty 14

## 2022-09-28 MED ORDER — SODIUM CHLORIDE 0.9 % IV SOLN: Freq: Once | INTRAVENOUS | Status: AC

## 2022-09-28 NOTE — Progress Notes (Signed)
Zirabev diluted in NS187mL, Exp:  01/26/2024, Lot: [id number]   Richardean Sale, RPH, BCPS, BCOP 09/28/2022 8:22 AM

## 2022-09-28 NOTE — Patient Instructions (Signed)
[facility]  Discharge Instructions: Thank you for choosing [facility] to provide your oncology and hematology care.   If you have a lab appointment with the Cancer Center, please go directly to the Cancer Center and check in at the registration area.   Wear comfortable clothing and clothing appropriate for easy access to any Portacath or PICC line.   We strive to give you quality time with your provider. You may need to reschedule your appointment if you arrive late (15 or more minutes).  Arriving late affects you and other patients whose appointments are after yours.  Also, if you miss three or more appointments without notifying the office, you may be dismissed from the clinic at the provider's discretion.      For prescription refill requests, have your pharmacy contact our office and allow 72 hours for refills to be completed.    Today you received the following chemotherapy and/or immunotherapy agents: Bevacizumab      To help prevent nausea and vomiting after your treatment, we encourage you to take your nausea medication as directed.  BELOW ARE SYMPTOMS THAT SHOULD BE REPORTED IMMEDIATELY: *FEVER GREATER THAN 100.4 F (38 C) OR HIGHER *CHILLS OR SWEATING *NAUSEA AND VOMITING THAT IS NOT CONTROLLED WITH YOUR NAUSEA MEDICATION *UNUSUAL SHORTNESS OF BREATH *UNUSUAL BRUISING OR BLEEDING *URINARY PROBLEMS (pain or burning when urinating, or frequent urination) *BOWEL PROBLEMS (unusual diarrhea, constipation, pain near the anus) TENDERNESS IN MOUTH AND THROAT WITH OR WITHOUT PRESENCE OF ULCERS (sore throat, sores in mouth, or a toothache) UNUSUAL RASH, SWELLING OR PAIN  UNUSUAL VAGINAL DISCHARGE OR ITCHING   Items with * indicate a potential emergency and should be followed up as soon as possible or go to the Emergency Department if any problems should occur.  Please show the CHEMOTHERAPY ALERT CARD or IMMUNOTHERAPY ALERT CARD at  check-in to the Emergency Department and triage nurse.  Should you have questions after your visit or need to cancel or reschedule your appointment, please contact [place] [facility]  Dept: [phone]  and follow the prompts.  Office hours are 8:00 a.m. to 4:30 p.m. Monday - Friday. Please note that voicemails left after 4:00 p.m. may not be returned until the following business day.  We are closed weekends and major holidays. You have access to a nurse at all times for urgent questions. Please call the main number to the clinic Dept: [phone] and follow the prompts.   For any non-urgent questions, you may also contact your provider using MyChart. We now offer e-Visits for anyone 48 and older to request care online for non-urgent symptoms. For details visit [email or web address].   Also download the MyChart app! Go to the app store, search "MyChart", open the app, select [facility], and log in with your MyChart username and password.  Masks are optional in the cancer centers. If you would like for your care team to wear a mask while they are taking care of you, please let them know. For doctor visits, patients may have with them one support person who is at least 70 years old. At this time, visitors are not allowed in the infusion area.

## 2022-09-29 LAB — AFP TUMOR MARKER: AFP, Serum, Tumor Marker: 2.7 ng/mL (ref 0.0–9.2)

## 2022-10-04 ENCOUNTER — Other Ambulatory Visit: Payer: Self-pay | Admitting: Hematology

## 2022-10-05 ENCOUNTER — Other Ambulatory Visit: Payer: Self-pay

## 2022-10-05 ENCOUNTER — Inpatient Hospital Stay: Payer: 59

## 2022-10-05 ENCOUNTER — Other Ambulatory Visit (HOSPITAL_COMMUNITY): Payer: Self-pay

## 2022-10-05 VITALS — BP 148/80 | HR 75 | Temp 97.6°F | Resp 16

## 2022-10-05 DIAGNOSIS — D500 Iron deficiency anemia secondary to blood loss (chronic): Secondary | ICD-10-CM | POA: Diagnosis not present

## 2022-10-05 DIAGNOSIS — E1140 Type 2 diabetes mellitus with diabetic neuropathy, unspecified: Secondary | ICD-10-CM

## 2022-10-05 MED ORDER — ACETAMINOPHEN 325 MG PO TABS
650.0000 mg | ORAL_TABLET | Freq: Once | ORAL | Status: AC
  Administered 2022-10-05: 650 mg via ORAL
  Filled 2022-10-05: qty 2

## 2022-10-05 MED ORDER — FAMOTIDINE 20 MG IN NS 100 ML IVPB: 20.0000 mg | Freq: Once | INTRAVENOUS | Status: DC

## 2022-10-05 MED ORDER — EMPAGLIFLOZIN 10 MG PO TABS
10.0000 mg | ORAL_TABLET | Freq: Every day | ORAL | 0 refills | Status: DC
Start: 2022-10-05 — End: 2022-12-11
  Filled 2022-10-05: qty 30, 30d supply, fill #0

## 2022-10-05 MED ORDER — FAMOTIDINE 20 MG IN NS 100 ML IVPB
20.0000 mg | Freq: Once | INTRAVENOUS | Status: AC
  Administered 2022-10-05: 20 mg via INTRAVENOUS
  Filled 2022-10-05: qty 100

## 2022-10-05 MED ORDER — SODIUM CHLORIDE 0.9 % IV SOLN: INTRAVENOUS | Status: DC

## 2022-10-05 MED ORDER — DIPHENHYDRAMINE HCL 25 MG PO CAPS
25.0000 mg | ORAL_CAPSULE | Freq: Once | ORAL | Status: AC
  Administered 2022-10-05: 25 mg via ORAL
  Filled 2022-10-05: qty 1

## 2022-10-05 MED ORDER — SODIUM CHLORIDE 0.9 % IV SOLN
510.0000 mg | Freq: Once | INTRAVENOUS | Status: AC
  Administered 2022-10-05: 510 mg via INTRAVENOUS
  Filled 2022-10-05: qty 510

## 2022-10-05 NOTE — Progress Notes (Signed)
Patient remained for 30 min post obs.  VSS at discharge, tolerated treatment well without incident.  Wheelchair to lobby.

## 2022-10-12 ENCOUNTER — Encounter: Payer: Self-pay | Admitting: Hematology

## 2022-10-12 ENCOUNTER — Inpatient Hospital Stay: Payer: 59

## 2022-10-12 ENCOUNTER — Other Ambulatory Visit: Payer: Self-pay

## 2022-10-12 VITALS — BP 161/72 | HR 67 | Temp 97.5°F | Resp 18

## 2022-10-12 DIAGNOSIS — D500 Iron deficiency anemia secondary to blood loss (chronic): Secondary | ICD-10-CM

## 2022-10-12 DIAGNOSIS — C19 Malignant neoplasm of rectosigmoid junction: Secondary | ICD-10-CM

## 2022-10-12 DIAGNOSIS — K922 Gastrointestinal hemorrhage, unspecified: Secondary | ICD-10-CM

## 2022-10-12 DIAGNOSIS — Z95828 Presence of other vascular implants and grafts: Secondary | ICD-10-CM | POA: Insufficient documentation

## 2022-10-12 LAB — CBC WITH DIFFERENTIAL (CANCER CENTER ONLY)
Abs Immature Granulocytes: 0.01 10*3/uL (ref 0.00–0.07)
Basophils Absolute: 0 10*3/uL (ref 0.0–0.1)
Basophils Relative: 0 %
Eosinophils Absolute: 0.1 10*3/uL (ref 0.0–0.5)
Eosinophils Relative: 2 %
HCT: 30.5 % — ABNORMAL LOW (ref 36.0–46.0)
Hemoglobin: 9.1 g/dL — ABNORMAL LOW (ref 12.0–15.0)
Immature Granulocytes: 0 %
Lymphocytes Relative: 20 %
Lymphs Abs: 1.4 10*3/uL (ref 0.7–4.0)
MCH: 26.7 pg (ref 26.0–34.0)
MCHC: 29.8 g/dL — ABNORMAL LOW (ref 30.0–36.0)
MCV: 89.4 fL (ref 80.0–100.0)
Monocytes Absolute: 0.7 10*3/uL (ref 0.1–1.0)
Monocytes Relative: 11 %
Neutro Abs: 4.5 10*3/uL (ref 1.7–7.7)
Neutrophils Relative %: 67 %
Platelet Count: 326 10*3/uL (ref 150–400)
RBC: 3.41 MIL/uL — ABNORMAL LOW (ref 3.87–5.11)
RDW: 18.4 % — ABNORMAL HIGH (ref 11.5–15.5)
WBC Count: 6.8 10*3/uL (ref 4.0–10.5)
nRBC: 0 % (ref 0.0–0.2)

## 2022-10-12 LAB — CMP (CANCER CENTER ONLY)
ALT: 6 U/L (ref 0–44)
AST: 13 U/L — ABNORMAL LOW (ref 15–41)
Albumin: 3.9 g/dL (ref 3.5–5.0)
Alkaline Phosphatase: 96 U/L (ref 38–126)
Anion gap: 8 (ref 5–15)
BUN: 14 mg/dL (ref 8–23)
CO2: 27 mmol/L (ref 22–32)
Calcium: 8.8 mg/dL — ABNORMAL LOW (ref 8.9–10.3)
Chloride: 106 mmol/L (ref 98–111)
Creatinine: 1.61 mg/dL — ABNORMAL HIGH (ref 0.44–1.00)
GFR, Estimated: 34 mL/min — ABNORMAL LOW (ref >60)
Glucose, Bld: 99 mg/dL (ref 70–99)
Potassium: 3.9 mmol/L (ref 3.5–5.1)
Sodium: 141 mmol/L (ref 135–145)
Total Bilirubin: 0.2 mg/dL — ABNORMAL LOW (ref 0.3–1.2)
Total Protein: 6.8 g/dL (ref 6.5–8.1)

## 2022-10-12 LAB — RAPID URINE DRUG SCREEN, HOSP PERFORMED
Amphetamines: NOT DETECTED
Barbiturates: NOT DETECTED
Benzodiazepines: NOT DETECTED
Cocaine: POSITIVE — AB
Opiates: NOT DETECTED
Tetrahydrocannabinol: POSITIVE — AB

## 2022-10-12 LAB — TOTAL PROTEIN, URINE DIPSTICK: Protein, ur: 300 mg/dL — AB

## 2022-10-12 LAB — FERRITIN: Ferritin: 211 ng/mL (ref 11–307)

## 2022-10-12 MED ORDER — FAMOTIDINE 20 MG IN NS 100 ML IVPB: 20.0000 mg | Freq: Once | INTRAVENOUS | Status: DC

## 2022-10-12 MED ORDER — FAMOTIDINE 20 MG IN NS 100 ML IVPB
20.0000 mg | Freq: Once | INTRAVENOUS | Status: AC
  Administered 2022-10-12: 20 mg via INTRAVENOUS
  Filled 2022-10-12: qty 100

## 2022-10-12 MED ORDER — SODIUM CHLORIDE 0.9 % IV SOLN
510.0000 mg | Freq: Once | INTRAVENOUS | Status: AC
  Administered 2022-10-12: 510 mg via INTRAVENOUS
  Filled 2022-10-12: qty 510

## 2022-10-12 MED ORDER — DIPHENHYDRAMINE HCL 25 MG PO CAPS
25.0000 mg | ORAL_CAPSULE | Freq: Once | ORAL | Status: AC
  Administered 2022-10-12: 25 mg via ORAL
  Filled 2022-10-12: qty 1

## 2022-10-12 MED ORDER — SODIUM CHLORIDE 0.9 % IV SOLN: INTRAVENOUS | Status: DC

## 2022-10-12 MED ORDER — SODIUM CHLORIDE 0.9% FLUSH: 10.0000 mL | INTRAVENOUS | Status: DC | PRN

## 2022-10-12 MED ORDER — ACETAMINOPHEN 325 MG PO TABS
650.0000 mg | ORAL_TABLET | Freq: Once | ORAL | Status: AC
  Administered 2022-10-12: 650 mg via ORAL
  Filled 2022-10-12: qty 2

## 2022-10-12 NOTE — Progress Notes (Signed)
OK to treat with Iron today. Hold Bevacizumab due to positive drug test per Dr. Mosetta Putt

## 2022-10-12 NOTE — Patient Instructions (Signed)
Ferumoxytol Injection What is this medication? FERUMOXYTOL (FER ue MOX i tol) treats low levels of iron in your body (iron deficiency anemia). Iron is a mineral that plays an important role in making red blood cells, which carry oxygen from your lungs to the rest of your body. This medicine may be used for other purposes; ask your health care provider or pharmacist if you have questions. COMMON BRAND NAME(S): Feraheme What should I tell my care team before I take this medication? They need to know if you have any of these conditions: Anemia not caused by low iron levels High levels of iron in the blood Magnetic resonance imaging (MRI) test scheduled An unusual or allergic reaction to iron, other medications, foods, dyes, or preservatives Pregnant or trying to get pregnant Breastfeeding How should I use this medication? This medication is injected into a vein. It is given by your care team in a hospital or clinic setting. Talk to your care team the use of this medication in children. Special care may be needed. Overdosage: If you think you have taken too much of this medicine contact a poison control center or emergency room at once. NOTE: This medicine is only for you. Do not share this medicine with others. What if I miss a dose? It is important not to miss your dose. Call your care team if you are unable to keep an appointment. What may interact with this medication? Other iron products This list may not describe all possible interactions. Give your health care provider a list of all the medicines, herbs, non-prescription drugs, or dietary supplements you use. Also tell them if you smoke, drink alcohol, or use illegal drugs. Some items may interact with your medicine. What should I watch for while using this medication? Visit your care team regularly. Tell your care team if your symptoms do not start to get better or if they get worse. You may need blood work done while you are taking this  medication. You may need to follow a special diet. Talk to your care team. Foods that contain iron include: whole grains/cereals, dried fruits, beans, or peas, leafy green vegetables, and organ meats (liver, kidney). What side effects may I notice from receiving this medication? Side effects that you should report to your care team as soon as possible: Allergic reactions--skin rash, itching, hives, swelling of the face, lips, tongue, or throat Low blood pressure--dizziness, feeling faint or lightheaded, blurry vision Shortness of breath Side effects that usually do not require medical attention (report to your care team if they continue or are bothersome): Flushing Headache Joint pain Muscle pain Nausea Pain, redness, or irritation at injection site This list may not describe all possible side effects. Call your doctor for medical advice about side effects. You may report side effects to FDA at 1-800-FDA-1088. Where should I keep my medication? This medication is given in a hospital or clinic and will not be stored at home. NOTE: This sheet is a summary. It may not cover all possible information. If you have questions about this medicine, talk to your doctor, pharmacist, or health care provider.  2023 Elsevier/Gold Standard (2020-10-05 00:00:00) [facility]  Discharge Instructions: Thank you for choosing [facility] to provide your oncology and hematology care.   If you have a lab appointment with the Cancer Center, please go directly to the Cancer Center and check in at the registration area.   Wear comfortable clothing and clothing appropriate  for easy access to any Portacath or PICC line.   We strive to give you quality time with your provider. You may need to reschedule your appointment if you arrive late (15 or more minutes).  Arriving late affects you and other patients whose appointments are after yours.  Also, if you miss three or  more appointments without notifying the office, you may be dismissed from the clinic at the provider's discretion.      For prescription refill requests, have your pharmacy contact our office and allow 72 hours for refills to be completed.    Today you received the following chemotherapy and/or immunotherapy agents: Bevacizumab      To help prevent nausea and vomiting after your treatment, we encourage you to take your nausea medication as directed.  BELOW ARE SYMPTOMS THAT SHOULD BE REPORTED IMMEDIATELY: *FEVER GREATER THAN 100.4 F (38 C) OR HIGHER *CHILLS OR SWEATING *NAUSEA AND VOMITING THAT IS NOT CONTROLLED WITH YOUR NAUSEA MEDICATION *UNUSUAL SHORTNESS OF BREATH *UNUSUAL BRUISING OR BLEEDING *URINARY PROBLEMS (pain or burning when urinating, or frequent urination) *BOWEL PROBLEMS (unusual diarrhea, constipation, pain near the anus) TENDERNESS IN MOUTH AND THROAT WITH OR WITHOUT PRESENCE OF ULCERS (sore throat, sores in mouth, or a toothache) UNUSUAL RASH, SWELLING OR PAIN  UNUSUAL VAGINAL DISCHARGE OR ITCHING   Items with * indicate a potential emergency and should be followed up as soon as possible or go to the Emergency Department if any problems should occur.  Please show the CHEMOTHERAPY ALERT CARD or IMMUNOTHERAPY ALERT CARD at check-in to the Emergency Department and triage nurse.  Should you have questions after your visit or need to cancel or reschedule your appointment, please contact [facility]  Dept: [phone]  and follow the prompts.  Office hours are 8:00 a.m. to 4:30 p.m. Monday - Friday. Please note that voicemails left after 4:00 p.m. may not be returned until the following business day.  We are closed weekends and major holidays. You have access to a nurse at all times for urgent questions. Please call the main number to the clinic Dept: [phone] and follow the prompts.   For any non-urgent questions, you may also  contact your provider using MyChart. We now offer e-Visits for anyone 20 and older to request care online for non-urgent symptoms. For details visit [email or web address].   Also download the MyChart app! Go to the app store, search "MyChart", open the app, select [facility], and log in with your MyChart username and password.  Masks are optional in the cancer centers. If you would like for your care team to wear a mask while they are taking care of you, please let them know. For doctor visits, patients may have with them one support person who is at least 70 years old. At this time, visitors are not allowed in the infusion area.

## 2022-10-15 ENCOUNTER — Other Ambulatory Visit (HOSPITAL_COMMUNITY): Payer: Self-pay

## 2022-10-25 NOTE — Progress Notes (Signed)
[facility]   [phone] [phone]   Clinic Follow up Note   Patient Care Team: Lyndle Herrlich, MD as PCP - Keturah Barre, MD as Consulting Physician (Ophthalmology) Abigail Miyamoto, MD as Consulting Physician (General Surgery) Malachy Mood, MD as Consulting Physician (Hematology) Comer, Belia Heman, MD as Consulting Physician (Infectious Diseases) Sherrilyn Rist, MD as Consulting Physician (Gastroenterology)  Date of Service:  10/26/2022  CHIEF COMPLAINT: f/u of anemia    CURRENT THERAPY:  anemia    ASSESSMENT:  [name] is a 72 y.o. female with     Iron deficiency anemia due to chronic blood loss secondary to GI blood loss, and Anemia of Chronic Disease -longstanding history of intermittent anemia dating back at least 2008 when Hg was 10. -h/o multiple GI bleeding in 2016, required blood transfusion, hospitalization and IV Feraheme -EGD and colonoscopy on 10/29/17 and 03/17/18 with Dr. Myrtie Neither, which were normal. Endoscopy in [facility] and was found to have AVM in small bowel. -Due to recurrent anemia requiring IV Feraheme and blood transfusions, she started bevacizumab for GI bleeding on 08/10/20. She responded very well with resolved anemia and less need for iv iron. This has been held intermittently due to elevated urine protein. Last dose given 04/03/22. -She is clinically doing well, no melena or other GI bleeding lately.  Lab reviewed, hemoglobin 11.9, iron level still pending. -Will continue IV iron and bevacizumab as needed -She has cut down her marijuana and cocaine use, I again encouraged her to completely stop. -She responded to IV iron and bevacizumab, anemia has resolved now -Due to her continuous substance abuse, I would not give her bevacizumab if she has positive urine drug screening.  She voiced good understanding.  Cocaine and marijuana abuse -She previously had a significant electrolyte imbalance,  likely due to her substance abuse -Her urine continues to be positive for cocaine and marijuana      PLAN: -lab reviewed -Iron level is good, anemia resolved -No treatment today  -Will continue to monitor CBC and iron level every 3 weeks -lab and f/u in 3 months     INTERVAL HISTORY:  [name] is here for a follow up of anemia . She was last seen by me on 09/27/2022. She presents to the clinic alone. Pt state that she feels better after the IV Iron. Overall she is clinically doing well.      All other systems were reviewed with the patient and are negative.  MEDICAL HISTORY:  Past Medical History:  Diagnosis Date   Allergic rhinitis    Anemia, iron deficiency 05/03/2011   2/2 GI AVMs - recieves iron infusions as well as periodic red blood cell transfusions   Anxiety    Arteriovenous malformation of duodenum 02/25/2015   CARPAL TUNNEL RELEASE, HX OF 07/22/2008   Chest pain 10/28/2019   Colon polyps    Diabetes mellitus without complication (HCC)    type 2   Difficulty sleeping    takes trazadone for sleep   Diverticulosis    Electrolyte abnormality 01/25/2021   Fall 01/03/2020   GERD (gastroesophageal reflux disease)    GI bleed 07/07/2020   Hepatitis C    in SVR with Harvoni 12/2014-02/2015.    History of hernia repair 08/18/2012   History of porphyria 11/30/2014   Porphyria cutanea tarda in setting of chronic Hepatitis C, frequent  IV iron infusions, and alcohol abuse    Hypertension    Hypocalcemia 10/28/2019  Hypocalcemia 01/19/2021   Hypokalemia 09/27/2014   Hypokalemia 01/19/2021   Hypomagnesemia 01/19/2021   Kidney disease 02/04/2019   Peripheral vascular disease (HCC)    Prolonged QT interval 01/19/2021   Stroke (HCC) 2010   no residual deficits   Substance use disorder    cocaine   Tachycardia 07/10/2017   Trichomonas vaginitis 04/16/2019    SURGICAL HISTORY: Past Surgical History:  Procedure Laterality Date   ABDOMINAL HYSTERECTOMY     APPLICATION OF  WOUND VAC N/A 06/21/2014   Procedure: APPLICATION OF WOUND VAC;  Surgeon: Abigail Miyamoto, MD;  Location: [facility] OR;  Service: General;  Laterality: N/A;   CARPAL TUNNEL RELEASE     rt hand   COLONOSCOPY WITH PROPOFOL N/A 03/17/2018   Procedure: COLONOSCOPY WITH PROPOFOL;  Surgeon: Sherrilyn Rist, MD;  Location: [facility] ENDOSCOPY;  Service: Gastroenterology;  Laterality: N/A;   ENTEROSCOPY N/A 12/04/2012   Procedure: ENTEROSCOPY;  Surgeon: Theda Belfast, MD;  Location: [facility] ENDOSCOPY;  Service: Endoscopy;  Laterality: N/A;   ENTEROSCOPY N/A 12/18/2012   Procedure: ENTEROSCOPY;  Surgeon: Theda Belfast, MD;  Location: [facility] ENDOSCOPY;  Service: Endoscopy;  Laterality: N/A;   ENTEROSCOPY N/A 02/25/2015   Procedure: ENTEROSCOPY;  Surgeon: Louis Meckel, MD;  Location: [facility] ENDOSCOPY;  Service: Endoscopy;  Laterality: N/A;   ENTEROSCOPY N/A 10/29/2017   Procedure: ENTEROSCOPY;  Surgeon: Sherrilyn Rist, MD;  Location: [facility] ENDOSCOPY;  Service: Gastroenterology;  Laterality: N/A;   ENTEROSCOPY N/A 07/08/2020   Procedure: ENTEROSCOPY;  Surgeon: Rachael Fee, MD;  Location: [facility] ENDOSCOPY;  Service: Endoscopy;  Laterality: N/A;   ESOPHAGOGASTRODUODENOSCOPY  05/05/2011   Procedure: ESOPHAGOGASTRODUODENOSCOPY (EGD);  Surgeon: Erick Blinks, MD;  Location: [facility] ENDOSCOPY;  Service: Gastroenterology;  Laterality: N/A;  Dr. Rhea Belton will do procedure for Dr. [name] Saturday.   ESOPHAGOGASTRODUODENOSCOPY  05/08/2011   Procedure: ESOPHAGOGASTRODUODENOSCOPY (EGD);  Surgeon: Theda Belfast;  Location: [facility] ENDOSCOPY;  Service: Endoscopy;  Laterality: N/A;   ESOPHAGOGASTRODUODENOSCOPY  06/07/2011   Procedure: ESOPHAGOGASTRODUODENOSCOPY (EGD);  Surgeon: Theda Belfast, MD;  Location: [facility] ENDOSCOPY;  Service: Endoscopy;  Laterality: N/A;   ESOPHAGOGASTRODUODENOSCOPY  12/20/2011   Procedure: ESOPHAGOGASTRODUODENOSCOPY (EGD);  Surgeon: Theda Belfast, MD;  Location: [facility] ENDOSCOPY;  Service: Endoscopy;  Laterality: N/A;   EYE SURGERY  Bilateral    cataracts   FLEXIBLE SIGMOIDOSCOPY  12/21/2011   Procedure: FLEXIBLE SIGMOIDOSCOPY;  Surgeon: Theda Belfast, MD;  Location: [facility] ENDOS[facility];  Service: Endoscopy;  Laterality: N/A;   HOT HEMOSTASIS  06/07/2011   Procedure: HOT HEMOSTASIS (ARGON PLASMA COAGULATION/BICAP);  Surgeon: Theda Belfast, MD;  Location: [facility] ENDOSCOPY;  Service: Endoscopy;  Laterality: N/A;   HOT HEMOSTASIS N/A 07/08/2020   Procedure: HOT HEMOSTASIS (ARGON PLASMA COAGULATION/BICAP);  Surgeon: Rachael Fee, MD;  Location: [facility] ENDOSCOPY;  Service: Endoscopy;  Laterality: N/A;   INCISIONAL HERNIA REPAIR N/A 06/01/2014   Procedure: ATTEMPTED LAPAROSCOPIC AND OPEN INCISIONAL HERNIA REPAIR WITH MESH;  Surgeon: Abigail Miyamoto, MD;  Location[facility] OR;  Service: General;  Laterality: N/A;   INSERTION OF MESH N/A 06/01/2014   Procedure: INSERTION OF MESH;  Surgeon: Abigail Miyamoto, MD;  Location: [facility] OR;  Service: General;  Laterality: N/A;   LAPAROTOMY  02/16/2012   Procedure: EXPLORATORY LAPAROTOMY;  Surgeon: Mariella Saa, MD;  Location: [facility] ORS;  Service: General;  Laterality: N/A;  oversewing of anastomotic leak and rigid sigmoidoscopy   LAPAROTOMY N/A 06/21/2014   Procedure: ABDOMINAL WOUND EXPLORATION;  Surgeon: Abigail Miyamoto, MD;  Location: [facility]  OR;  Service: General;  Laterality: N/A;   LEG AMPUTATION ABOVE KNEE     left   PARTIAL COLECTOMY  02/15/2012   Procedure: PARTIAL COLECTOMY;  Surgeon: Shelly Rubenstein, MD;  Location: [facility] ORS;  Service: General;  Laterality: N/A;   SUBMUCOSAL INJECTION  07/08/2020   Procedure: SUBMUCOSAL INJECTION;  Surgeon: Rachael Fee, MD;  Location: [facility] ENDOSCOPY;  Service: Endoscopy;;   TONSILLECTOMY     WRIST FUSION Right 04/27/2021   Procedure: SCAPHOID EXCISION  WITH 4 BONE FUSION RIGHT WRIST;  Surgeon: Betha Loa, MD;  Location: [facility];  Service: Orthopedics;  Laterality: Right;    I have reviewed the social history and family history with the patient  and they are unchanged from previous note.  ALLERGIES:  is allergic to ciprofloxacin, morphine and codeine, penicillins, codeine, lisinopril, and feraheme [ferumoxytol].  MEDICATIONS:  Current Outpatient Medications  Medication Sig Dispense Refill   ACCU-CHEK FASTCLIX LANCETS MISC Use to test blood glucose 3 times daily. 100 each 11   amLODipine (NORVASC) 10 MG tablet Take 1 tablet (10 mg total) by mouth daily. 90 tablet 1   Blood Glucose Monitoring Suppl (ACCU-CHEK GUIDE) w/Device KIT 1 each by Does not apply route 3 (three) times daily. The patient is insulin requiring, ICD 10 code E11.65. The patient tests 3 times a day. 1 kit 1   calcium-vitamin D (OSCAL WITH D) 500-200 MG-UNIT tablet Take 1 tablet by mouth 2 (two) times daily. 60 tablet 0   camphor-menthol (SARNA) lotion Apply 1 Application topically as needed for itching. 222 mL 1   carvedilol (COREG) 6.25 MG tablet Take 2 tablets (12.5 mg total) by mouth daily. 180 tablet 3   colchicine 0.6 MG tablet Take 0.6 mg by mouth daily as needed.     Dulaglutide (TRULICITY) 0.75 MG/0.5ML SOPN Inject 0.75 mg into the skin once a week. 6 mL 3   empagliflozin (JARDIANCE) 10 MG TABS tablet Take 1 tablet (10 mg total) by mouth daily before breakfast. 30 tablet 0   famotidine (PEPCID) 20 MG tablet Take 1 tablet (20 mg total) by mouth 2 (two) times daily. 60 tablet 1   folic acid (FOLVITE) 1 MG tablet Take 1 tablet (1 mg total) by mouth daily. 30 tablet 2   gabapentin (NEURONTIN) 400 MG capsule Take 1 capsule (400 mg total) by mouth in the morning AND 1 capsule (400 mg total) daily in the afternoon AND 2 capsules (800 mg total) before bedtime. 360 capsule 3   glucose blood (ACCU-CHEK GUIDE) test strip Use as instructed 100 strip 3   losartan (COZAAR) 100 MG tablet Take 1 tablet (100 mg total) by mouth daily. 90 tablet 1   magnesium oxide (MAG-OX) 400 (240 Mg) MG tablet Take 1 tablet (400 mg total) by mouth daily. 30 tablet 5   omeprazole (PRILOSEC) 20  MG capsule Take 1 capsule (20 mg total) by mouth daily. 30 capsule 2   ondansetron (ZOFRAN) 4 MG tablet Take 1 tablet (4 mg total) by mouth every 8 (eight) hours as needed for nausea or vomiting. 20 tablet 0   rosuvastatin (CRESTOR) 20 MG tablet Take 1 tablet (20 mg total) by mouth daily. 90 tablet 2   Vitamin D, Ergocalciferol, (DRISDOL) 1.25 MG (50000 UNIT) CAPS capsule Take 1 capsule (50,000 Units total) by mouth every 7 (seven) days. 8 capsule 0   No current facility-administered medications for this visit.    PHYSICAL EXAMINATION: ECOG PERFORMANCE STATUS: 1 - Symptomatic but  completely ambulatory  Vitals:   10/26/22 0822  BP: (!) 113/93  Pulse: 94  Resp: 18  Temp: 97.8 F (36.6 C)  SpO2: 94%   Wt Readings from Last 3 Encounters:  10/26/22 149 lb 14.4 oz (68 kg)  09/27/22 152 lb 14.4 oz (69.4 kg)  09/03/22 153 lb 10.6 oz (69.7 kg)     GENERAL:alert, no distress and comfortable SKIN: skin color normal, no rashes or significant lesions EYES: normal, Conjunctiva are pink and non-injected, sclera clear  NEURO: alert & oriented x 3 with fluent speech   LABORATORY DATA:  I have reviewed the data as listed    Latest Ref Rng & Units 10/26/2022    8:11 AM 10/12/2022    7:28 AM 09/27/2022    8:16 AM  CBC  WBC 4.0 - 10.5 K/uL 6.7  6.8  9.9   Hemoglobin 12.0 - 15.0 g/dL 16.1  9.1  7.8   Hematocrit 36.0 - 46.0 % 39.4  30.5  25.8   Platelets 150 - 400 K/uL 361  326  350         Latest Ref Rng & Units 10/26/2022    8:11 AM 10/12/2022    7:28 AM 09/27/2022    8:16 AM  CMP  Glucose 70 - 99 mg/dL 096  99  045   BUN 8 - 23 mg/dL 17  14  20    Creatinine 0.44 - 1.00 mg/dL 4.09  8.11  9.14   Sodium 135 - 145 mmol/L 139  141  141   Potassium 3.5 - 5.1 mmol/L 4.3  3.9  4.0   Chloride 98 - 111 mmol/L 103  106  107   CO2 22 - 32 mmol/L 25  27  28    Calcium 8.9 - 10.3 mg/dL 9.6  8.8  8.2   Total Protein 6.5 - 8.1 g/dL 8.2  6.8  6.8   Total Bilirubin 0.3 - 1.2 mg/dL 0.3  0.2  0.2    Alkaline Phos 38 - 126 U/L 110  96  87   AST 15 - 41 U/L 19  13  13    ALT 0 - 44 U/L 10  6  7        RADIOGRAPHIC STUDIES: I have personally reviewed the radiological images as listed and agreed with the findings in the report. No results found.    No orders of the defined types were placed in this encounter.  All questions were answered. The patient knows to call the clinic with any problems, questions or concerns. No barriers to learning was detected. The total time spent in the appointment was 15 minutes.     Malachy Mood, MD 10/26/2022   Carolin Coy, CMA, am acting as scribe for Malachy Mood, MD.   I have reviewed the above documentation for accuracy and completeness, and I agree with the above.

## 2022-10-26 ENCOUNTER — Inpatient Hospital Stay (HOSPITAL_BASED_OUTPATIENT_CLINIC_OR_DEPARTMENT_OTHER): Payer: 59 | Admitting: Hematology

## 2022-10-26 ENCOUNTER — Encounter: Payer: Self-pay | Admitting: Hematology

## 2022-10-26 ENCOUNTER — Inpatient Hospital Stay: Payer: 59

## 2022-10-26 VITALS — BP 113/93 | HR 94 | Temp 97.8°F | Resp 18 | Ht 63.0 in | Wt 149.9 lb

## 2022-10-26 DIAGNOSIS — C19 Malignant neoplasm of rectosigmoid junction: Secondary | ICD-10-CM

## 2022-10-26 DIAGNOSIS — D500 Iron deficiency anemia secondary to blood loss (chronic): Secondary | ICD-10-CM

## 2022-10-26 LAB — RAPID URINE DRUG SCREEN, HOSP PERFORMED
Amphetamines: NOT DETECTED
Barbiturates: NOT DETECTED
Benzodiazepines: NOT DETECTED
Cocaine: POSITIVE — AB
Opiates: NOT DETECTED
Tetrahydrocannabinol: POSITIVE — AB

## 2022-10-26 LAB — CMP (CANCER CENTER ONLY)
ALT: 10 U/L (ref 0–44)
AST: 19 U/L (ref 15–41)
Albumin: 4.6 g/dL (ref 3.5–5.0)
Alkaline Phosphatase: 110 U/L (ref 38–126)
Anion gap: 11 (ref 5–15)
BUN: 17 mg/dL (ref 8–23)
CO2: 25 mmol/L (ref 22–32)
Calcium: 9.6 mg/dL (ref 8.9–10.3)
Chloride: 103 mmol/L (ref 98–111)
Creatinine: 1.45 mg/dL — ABNORMAL HIGH (ref 0.44–1.00)
GFR, Estimated: 39 mL/min — ABNORMAL LOW (ref >60)
Glucose, Bld: 106 mg/dL — ABNORMAL HIGH (ref 70–99)
Potassium: 4.3 mmol/L (ref 3.5–5.1)
Sodium: 139 mmol/L (ref 135–145)
Total Bilirubin: 0.3 mg/dL (ref 0.3–1.2)
Total Protein: 8.2 g/dL — ABNORMAL HIGH (ref 6.5–8.1)

## 2022-10-26 LAB — CBC WITH DIFFERENTIAL (CANCER CENTER ONLY)
Abs Immature Granulocytes: 0.09 10*3/uL — ABNORMAL HIGH (ref 0.00–0.07)
Basophils Absolute: 0.1 10*3/uL (ref 0.0–0.1)
Basophils Relative: 1 %
Eosinophils Absolute: 0.1 10*3/uL (ref 0.0–0.5)
Eosinophils Relative: 2 %
HCT: 39.4 % (ref 36.0–46.0)
Hemoglobin: 12.5 g/dL (ref 12.0–15.0)
Immature Granulocytes: 1 %
Lymphocytes Relative: 21 %
Lymphs Abs: 1.4 10*3/uL (ref 0.7–4.0)
MCH: 27.5 pg (ref 26.0–34.0)
MCHC: 31.7 g/dL (ref 30.0–36.0)
MCV: 86.6 fL (ref 80.0–100.0)
Monocytes Absolute: 0.8 10*3/uL (ref 0.1–1.0)
Monocytes Relative: 12 %
Neutro Abs: 4.3 10*3/uL (ref 1.7–7.7)
Neutrophils Relative %: 63 %
Platelet Count: 361 10*3/uL (ref 150–400)
RBC: 4.55 MIL/uL (ref 3.87–5.11)
RDW: 18.8 % — ABNORMAL HIGH (ref 11.5–15.5)
WBC Count: 6.7 10*3/uL (ref 4.0–10.5)
nRBC: 0 % (ref 0.0–0.2)

## 2022-10-26 LAB — FERRITIN: Ferritin: 304 ng/mL (ref 11–307)

## 2022-10-30 ENCOUNTER — Encounter: Payer: Self-pay | Admitting: *Deleted

## 2022-10-31 ENCOUNTER — Other Ambulatory Visit: Payer: Self-pay | Admitting: Student

## 2022-10-31 ENCOUNTER — Other Ambulatory Visit: Payer: Self-pay

## 2022-10-31 ENCOUNTER — Other Ambulatory Visit (HOSPITAL_COMMUNITY): Payer: Self-pay

## 2022-10-31 DIAGNOSIS — K219 Gastro-esophageal reflux disease without esophagitis: Secondary | ICD-10-CM

## 2022-11-01 ENCOUNTER — Other Ambulatory Visit (HOSPITAL_COMMUNITY): Payer: Self-pay

## 2022-11-01 MED ORDER — FAMOTIDINE 20 MG PO TABS
20.0000 mg | ORAL_TABLET | Freq: Two times a day (BID) | ORAL | 1 refills | Status: AC
Start: 2022-11-01 — End: ?
  Filled 2022-11-01: qty 60, 30d supply, fill #0
  Filled 2022-12-05: qty 60, 30d supply, fill #1

## 2022-11-01 MED ORDER — OMEPRAZOLE 20 MG PO CPDR
20.0000 mg | DELAYED_RELEASE_CAPSULE | Freq: Every day | ORAL | 2 refills | Status: DC
Start: 2022-11-01 — End: 2023-02-08
  Filled 2022-11-01: qty 30, 30d supply, fill #0
  Filled 2022-12-05: qty 30, 30d supply, fill #1
  Filled 2023-01-07: qty 30, 30d supply, fill #2

## 2022-11-02 ENCOUNTER — Other Ambulatory Visit (HOSPITAL_COMMUNITY): Payer: Self-pay

## 2022-11-09 ENCOUNTER — Other Ambulatory Visit: Payer: Self-pay

## 2022-11-09 ENCOUNTER — Inpatient Hospital Stay: Payer: 59

## 2022-11-09 ENCOUNTER — Inpatient Hospital Stay: Payer: 59 | Attending: Hematology

## 2022-11-09 DIAGNOSIS — F1410 Cocaine abuse, uncomplicated: Secondary | ICD-10-CM | POA: Diagnosis not present

## 2022-11-09 DIAGNOSIS — D500 Iron deficiency anemia secondary to blood loss (chronic): Secondary | ICD-10-CM | POA: Diagnosis present

## 2022-11-09 DIAGNOSIS — F1210 Cannabis abuse, uncomplicated: Secondary | ICD-10-CM | POA: Insufficient documentation

## 2022-11-09 DIAGNOSIS — C19 Malignant neoplasm of rectosigmoid junction: Secondary | ICD-10-CM

## 2022-11-09 DIAGNOSIS — K922 Gastrointestinal hemorrhage, unspecified: Secondary | ICD-10-CM

## 2022-11-09 LAB — CBC WITH DIFFERENTIAL (CANCER CENTER ONLY)
Abs Immature Granulocytes: 0.05 10*3/uL (ref 0.00–0.07)
Basophils Absolute: 0 10*3/uL (ref 0.0–0.1)
Basophils Relative: 1 %
Eosinophils Absolute: 0.1 10*3/uL (ref 0.0–0.5)
Eosinophils Relative: 2 %
HCT: 38.6 % (ref 36.0–46.0)
Hemoglobin: 12.2 g/dL (ref 12.0–15.0)
Immature Granulocytes: 1 %
Lymphocytes Relative: 24 %
Lymphs Abs: 1.4 10*3/uL (ref 0.7–4.0)
MCH: 28 pg (ref 26.0–34.0)
MCHC: 31.6 g/dL (ref 30.0–36.0)
MCV: 88.7 fL (ref 80.0–100.0)
Monocytes Absolute: 0.8 10*3/uL (ref 0.1–1.0)
Monocytes Relative: 13 %
Neutro Abs: 3.4 10*3/uL (ref 1.7–7.7)
Neutrophils Relative %: 59 %
Platelet Count: 207 10*3/uL (ref 150–400)
RBC: 4.35 MIL/uL (ref 3.87–5.11)
RDW: 18.9 % — ABNORMAL HIGH (ref 11.5–15.5)
WBC Count: 5.7 10*3/uL (ref 4.0–10.5)
nRBC: 0 % (ref 0.0–0.2)

## 2022-11-09 LAB — CMP (CANCER CENTER ONLY)
ALT: 13 U/L (ref 0–44)
AST: 20 U/L (ref 15–41)
Albumin: 3.7 g/dL (ref 3.5–5.0)
Alkaline Phosphatase: 97 U/L (ref 38–126)
Anion gap: 9 (ref 5–15)
BUN: 26 mg/dL — ABNORMAL HIGH (ref 8–23)
CO2: 24 mmol/L (ref 22–32)
Calcium: 8.5 mg/dL — ABNORMAL LOW (ref 8.9–10.3)
Chloride: 106 mmol/L (ref 98–111)
Creatinine: 1.6 mg/dL — ABNORMAL HIGH (ref 0.44–1.00)
GFR, Estimated: 35 mL/min — ABNORMAL LOW (ref >60)
Glucose, Bld: 120 mg/dL — ABNORMAL HIGH (ref 70–99)
Potassium: 4 mmol/L (ref 3.5–5.1)
Sodium: 139 mmol/L (ref 135–145)
Total Bilirubin: 0.2 mg/dL — ABNORMAL LOW (ref 0.3–1.2)
Total Protein: 6.5 g/dL (ref 6.5–8.1)

## 2022-11-09 LAB — RAPID URINE DRUG SCREEN, HOSP PERFORMED
Amphetamines: NOT DETECTED
Barbiturates: NOT DETECTED
Benzodiazepines: NOT DETECTED
Cocaine: POSITIVE — AB
Opiates: NOT DETECTED
Tetrahydrocannabinol: POSITIVE — AB

## 2022-11-09 LAB — FERRITIN: Ferritin: 137 ng/mL (ref 11–307)

## 2022-11-09 NOTE — Progress Notes (Signed)
UDS positive for cocaine use. Per Dr Mosetta Putt HOLD bevacizumab tx today and no need for iron.

## 2022-11-12 ENCOUNTER — Inpatient Hospital Stay: Payer: 59

## 2022-11-12 ENCOUNTER — Other Ambulatory Visit: Payer: Self-pay

## 2022-11-12 DIAGNOSIS — D500 Iron deficiency anemia secondary to blood loss (chronic): Secondary | ICD-10-CM | POA: Diagnosis not present

## 2022-11-12 DIAGNOSIS — C19 Malignant neoplasm of rectosigmoid junction: Secondary | ICD-10-CM

## 2022-11-12 LAB — CBC WITH DIFFERENTIAL (CANCER CENTER ONLY)
Abs Immature Granulocytes: 0.04 10*3/uL (ref 0.00–0.07)
Basophils Absolute: 0.1 10*3/uL (ref 0.0–0.1)
Basophils Relative: 1 %
Eosinophils Absolute: 0.1 10*3/uL (ref 0.0–0.5)
Eosinophils Relative: 2 %
HCT: 38.8 % (ref 36.0–46.0)
Hemoglobin: 12.4 g/dL (ref 12.0–15.0)
Immature Granulocytes: 1 %
Lymphocytes Relative: 27 %
Lymphs Abs: 1.5 10*3/uL (ref 0.7–4.0)
MCH: 27.9 pg (ref 26.0–34.0)
MCHC: 32 g/dL (ref 30.0–36.0)
MCV: 87.2 fL (ref 80.0–100.0)
Monocytes Absolute: 0.8 10*3/uL (ref 0.1–1.0)
Monocytes Relative: 15 %
Neutro Abs: 3 10*3/uL (ref 1.7–7.7)
Neutrophils Relative %: 54 %
Platelet Count: 214 10*3/uL (ref 150–400)
RBC: 4.45 MIL/uL (ref 3.87–5.11)
RDW: 18.6 % — ABNORMAL HIGH (ref 11.5–15.5)
WBC Count: 5.4 10*3/uL (ref 4.0–10.5)
nRBC: 0 % (ref 0.0–0.2)

## 2022-11-12 LAB — CMP (CANCER CENTER ONLY)
ALT: 15 U/L (ref 0–44)
AST: 22 U/L (ref 15–41)
Albumin: 3.7 g/dL (ref 3.5–5.0)
Alkaline Phosphatase: 91 U/L (ref 38–126)
Anion gap: 7 (ref 5–15)
BUN: 34 mg/dL — ABNORMAL HIGH (ref 8–23)
CO2: 23 mmol/L (ref 22–32)
Calcium: 8.6 mg/dL — ABNORMAL LOW (ref 8.9–10.3)
Chloride: 108 mmol/L (ref 98–111)
Creatinine: 1.65 mg/dL — ABNORMAL HIGH (ref 0.44–1.00)
GFR, Estimated: 33 mL/min — ABNORMAL LOW (ref >60)
Glucose, Bld: 83 mg/dL (ref 70–99)
Potassium: 4.8 mmol/L (ref 3.5–5.1)
Sodium: 138 mmol/L (ref 135–145)
Total Bilirubin: 0.2 mg/dL — ABNORMAL LOW (ref 0.3–1.2)
Total Protein: 6.4 g/dL — ABNORMAL LOW (ref 6.5–8.1)

## 2022-11-12 LAB — RAPID URINE DRUG SCREEN, HOSP PERFORMED
Amphetamines: NOT DETECTED
Barbiturates: NOT DETECTED
Benzodiazepines: NOT DETECTED
Cocaine: POSITIVE — AB
Opiates: NOT DETECTED
Tetrahydrocannabinol: POSITIVE — AB

## 2022-11-12 LAB — FERRITIN: Ferritin: 115 ng/mL (ref 11–307)

## 2022-11-22 ENCOUNTER — Other Ambulatory Visit (HOSPITAL_COMMUNITY): Payer: Self-pay

## 2022-12-05 ENCOUNTER — Other Ambulatory Visit (HOSPITAL_COMMUNITY): Payer: Self-pay

## 2022-12-05 ENCOUNTER — Other Ambulatory Visit: Payer: Self-pay

## 2022-12-06 ENCOUNTER — Inpatient Hospital Stay: Payer: 59 | Attending: Hematology

## 2022-12-06 ENCOUNTER — Inpatient Hospital Stay: Payer: 59

## 2022-12-06 ENCOUNTER — Other Ambulatory Visit: Payer: Self-pay

## 2022-12-06 DIAGNOSIS — F1290 Cannabis use, unspecified, uncomplicated: Secondary | ICD-10-CM | POA: Diagnosis not present

## 2022-12-06 DIAGNOSIS — C19 Malignant neoplasm of rectosigmoid junction: Secondary | ICD-10-CM

## 2022-12-06 DIAGNOSIS — D500 Iron deficiency anemia secondary to blood loss (chronic): Secondary | ICD-10-CM | POA: Insufficient documentation

## 2022-12-06 DIAGNOSIS — F1410 Cocaine abuse, uncomplicated: Secondary | ICD-10-CM | POA: Insufficient documentation

## 2022-12-06 LAB — CBC WITH DIFFERENTIAL (CANCER CENTER ONLY)
Abs Immature Granulocytes: 0.1 10*3/uL — ABNORMAL HIGH (ref 0.00–0.07)
Basophils Absolute: 0.1 10*3/uL (ref 0.0–0.1)
Basophils Relative: 1 %
Eosinophils Absolute: 0.1 10*3/uL (ref 0.0–0.5)
Eosinophils Relative: 2 %
HCT: 38.4 % (ref 36.0–46.0)
Hemoglobin: 12.4 g/dL (ref 12.0–15.0)
Immature Granulocytes: 1 %
Lymphocytes Relative: 15 %
Lymphs Abs: 1.2 10*3/uL (ref 0.7–4.0)
MCH: 28.4 pg (ref 26.0–34.0)
MCHC: 32.3 g/dL (ref 30.0–36.0)
MCV: 87.9 fL (ref 80.0–100.0)
Monocytes Absolute: 1 10*3/uL (ref 0.1–1.0)
Monocytes Relative: 13 %
Neutro Abs: 5.5 10*3/uL (ref 1.7–7.7)
Neutrophils Relative %: 68 %
Platelet Count: 304 10*3/uL (ref 150–400)
RBC: 4.37 MIL/uL (ref 3.87–5.11)
RDW: 19.4 % — ABNORMAL HIGH (ref 11.5–15.5)
WBC Count: 8 10*3/uL (ref 4.0–10.5)
nRBC: 0 % (ref 0.0–0.2)

## 2022-12-06 LAB — CMP (CANCER CENTER ONLY)
ALT: 11 U/L (ref 0–44)
AST: 14 U/L — ABNORMAL LOW (ref 15–41)
Albumin: 3.7 g/dL (ref 3.5–5.0)
Alkaline Phosphatase: 89 U/L (ref 38–126)
Anion gap: 8 (ref 5–15)
BUN: 19 mg/dL (ref 8–23)
CO2: 26 mmol/L (ref 22–32)
Calcium: 9.3 mg/dL (ref 8.9–10.3)
Chloride: 107 mmol/L (ref 98–111)
Creatinine: 1.57 mg/dL — ABNORMAL HIGH (ref 0.44–1.00)
GFR, Estimated: 35 mL/min — ABNORMAL LOW (ref >60)
Glucose, Bld: 81 mg/dL (ref 70–99)
Potassium: 4.2 mmol/L (ref 3.5–5.1)
Sodium: 141 mmol/L (ref 135–145)
Total Bilirubin: 0.3 mg/dL (ref 0.3–1.2)
Total Protein: 7.1 g/dL (ref 6.5–8.1)

## 2022-12-06 LAB — FERRITIN: Ferritin: 49 ng/mL (ref 11–307)

## 2022-12-06 LAB — RAPID URINE DRUG SCREEN, HOSP PERFORMED
Amphetamines: NOT DETECTED
Barbiturates: NOT DETECTED
Benzodiazepines: NOT DETECTED
Cocaine: POSITIVE — AB
Opiates: NOT DETECTED
Tetrahydrocannabinol: POSITIVE — AB

## 2022-12-11 ENCOUNTER — Encounter: Payer: Self-pay | Admitting: Student

## 2022-12-11 ENCOUNTER — Other Ambulatory Visit: Payer: Self-pay

## 2022-12-11 ENCOUNTER — Other Ambulatory Visit (HOSPITAL_COMMUNITY): Payer: Self-pay

## 2022-12-11 ENCOUNTER — Ambulatory Visit (INDEPENDENT_AMBULATORY_CARE_PROVIDER_SITE_OTHER): Payer: 59 | Admitting: Student

## 2022-12-11 VITALS — BP 120/74 | HR 98 | Temp 98.6°F | Ht 61.0 in

## 2022-12-11 DIAGNOSIS — Z794 Long term (current) use of insulin: Secondary | ICD-10-CM

## 2022-12-11 DIAGNOSIS — S78112A Complete traumatic amputation at level between left hip and knee, initial encounter: Secondary | ICD-10-CM

## 2022-12-11 DIAGNOSIS — D500 Iron deficiency anemia secondary to blood loss (chronic): Secondary | ICD-10-CM | POA: Diagnosis not present

## 2022-12-11 DIAGNOSIS — I10 Essential (primary) hypertension: Secondary | ICD-10-CM

## 2022-12-11 DIAGNOSIS — N1832 Chronic kidney disease, stage 3b: Secondary | ICD-10-CM

## 2022-12-11 DIAGNOSIS — E1140 Type 2 diabetes mellitus with diabetic neuropathy, unspecified: Secondary | ICD-10-CM

## 2022-12-11 DIAGNOSIS — F1990 Other psychoactive substance use, unspecified, uncomplicated: Secondary | ICD-10-CM | POA: Diagnosis not present

## 2022-12-11 DIAGNOSIS — E559 Vitamin D deficiency, unspecified: Secondary | ICD-10-CM | POA: Diagnosis not present

## 2022-12-11 DIAGNOSIS — E119 Type 2 diabetes mellitus without complications: Secondary | ICD-10-CM

## 2022-12-11 DIAGNOSIS — Z89512 Acquired absence of left leg below knee: Secondary | ICD-10-CM

## 2022-12-11 DIAGNOSIS — I129 Hypertensive chronic kidney disease with stage 1 through stage 4 chronic kidney disease, or unspecified chronic kidney disease: Secondary | ICD-10-CM

## 2022-12-11 DIAGNOSIS — R209 Unspecified disturbances of skin sensation: Secondary | ICD-10-CM

## 2022-12-11 DIAGNOSIS — Z7984 Long term (current) use of oral hypoglycemic drugs: Secondary | ICD-10-CM

## 2022-12-11 DIAGNOSIS — E1122 Type 2 diabetes mellitus with diabetic chronic kidney disease: Secondary | ICD-10-CM

## 2022-12-11 MED ORDER — EMPAGLIFLOZIN 10 MG PO TABS
10.0000 mg | ORAL_TABLET | Freq: Every day | ORAL | 0 refills | Status: DC
Start: 2022-12-11 — End: 2023-01-16
  Filled 2022-12-11: qty 30, 30d supply, fill #0

## 2022-12-11 MED ORDER — TRULICITY 0.75 MG/0.5ML ~~LOC~~ SOAJ
0.7500 mg | SUBCUTANEOUS | 3 refills | Status: DC
Start: 2022-12-11 — End: 2024-02-11
  Filled 2022-12-11 – 2023-02-18 (×2): qty 6, 84d supply, fill #0
  Filled 2023-05-20: qty 6, 84d supply, fill #1
  Filled 2023-08-21: qty 6, 84d supply, fill #2
  Filled 2023-11-06: qty 6, 84d supply, fill #3

## 2022-12-11 NOTE — Progress Notes (Signed)
CC: Follow up  HPI:  [name] is a 72 y.o. female living with a history stated below and presents today for follow up. Please see problem based assessment and plan for additional details.  Past Medical History:  Diagnosis Date   Allergic rhinitis    Anemia, iron deficiency 05/03/2011   2/2 GI AVMs - recieves iron infusions as well as periodic red blood cell transfusions   Anxiety    Arteriovenous malformation of duodenum 02/25/2015   CARPAL TUNNEL RELEASE, HX OF 07/22/2008   Chest pain 10/28/2019   Colon polyps    Diabetes mellitus without complication (HCC)    type 2   Difficulty sleeping    takes trazadone for sleep   Diverticulosis    Electrolyte abnormality 01/25/2021   Fall 01/03/2020   GERD (gastroesophageal reflux disease)    GI bleed 07/07/2020   Hepatitis C    in SVR with Harvoni 12/2014-02/2015.    History of hernia repair 08/18/2012   History of porphyria 11/30/2014   Porphyria cutanea tarda in setting of chronic Hepatitis C, frequent  IV iron infusions, and alcohol abuse    Hypertension    Hypocalcemia 10/28/2019   Hypocalcemia 01/19/2021   Hypokalemia 09/27/2014   Hypokalemia 01/19/2021   Hypomagnesemia 01/19/2021   Kidney disease 02/04/2019   Peripheral vascular disease (HCC)    Prolonged QT interval 01/19/2021   Stroke (HCC) 2010   no residual deficits   Substance use disorder    cocaine   Tachycardia 07/10/2017   Trichomonas vaginitis 04/16/2019    Current Outpatient Medications on File Prior to Visit  Medication Sig Dispense Refill   ACCU-CHEK FASTCLIX LANCETS MISC Use to test blood glucose 3 times daily. 100 each 11   amLODipine (NORVASC) 10 MG tablet Take 1 tablet (10 mg total) by mouth daily. 90 tablet 1   Blood Glucose Monitoring Suppl (ACCU-CHEK GUIDE) w/Device KIT 1 each by Does not apply route 3 (three) times daily. The patient is insulin requiring, ICD 10 code E11.65. The patient tests 3 times a day. 1 kit 1   calcium-vitamin D (OSCAL WITH D)  500-200 MG-UNIT tablet Take 1 tablet by mouth 2 (two) times daily. 60 tablet 0   camphor-menthol (SARNA) lotion Apply 1 Application topically as needed for itching. 222 mL 1   carvedilol (COREG) 6.25 MG tablet Take 2 tablets (12.5 mg total) by mouth daily. 180 tablet 3   colchicine 0.6 MG tablet Take 0.6 mg by mouth daily as needed.     famotidine (PEPCID) 20 MG tablet Take 1 tablet (20 mg total) by mouth 2 (two) times daily. 60 tablet 1   folic acid (FOLVITE) 1 MG tablet Take 1 tablet (1 mg total) by mouth daily. 30 tablet 2   gabapentin (NEURONTIN) 400 MG capsule Take 1 capsule (400 mg total) by mouth in the morning AND 1 capsule (400 mg total) daily in the afternoon AND 2 capsules (800 mg total) before bedtime. 360 capsule 3   glucose blood (ACCU-CHEK GUIDE) test strip Use as instructed 100 strip 3   losartan (COZAAR) 100 MG tablet Take 1 tablet (100 mg total) by mouth daily. 90 tablet 1   magnesium oxide (MAG-OX) 400 (240 Mg) MG tablet Take 1 tablet (400 mg total) by mouth daily. 30 tablet 5   omeprazole (PRILOSEC) 20 MG capsule Take 1 capsule (20 mg total) by mouth daily. 30 capsule 2   ondansetron (ZOFRAN) 4 MG tablet Take 1 tablet (4 mg total) by  mouth every 8 (eight) hours as needed for nausea or vomiting. 20 tablet 0   rosuvastatin (CRESTOR) 20 MG tablet Take 1 tablet (20 mg total) by mouth daily. 90 tablet 2   No current facility-administered medications on file prior to visit.    Family History  Problem Relation Age of Onset   Cancer Father        unknown   Hypertension Mother    Diverticulitis Mother     Social History   Socioeconomic History   Marital status: Widowed    Spouse name: Not on file   Number of children: 3   Years of education: Not on file   Highest education level: Not on file  Occupational History   Occupation: Retired  Tobacco Use   Smoking status: Some Days    Current packs/day: 0.50    Average packs/day: 0.5 packs/day for 17.0 years (8.5 ttl pk-yrs)     Types: Cigarettes   Smokeless tobacco: Never   Tobacco comments:    1 pack last 1 month  Vaping Use   Vaping status: Never Used  Substance and Sexual Activity   Alcohol use: Yes    Comment: few drinks a week   Drug use: Yes    Types: Cocaine, Marijuana    Comment: Sometimes. last used Marijuana 04/22/21; last used cocaine 04/19/21   Sexual activity: Yes    Partners: Male    Birth control/protection: Post-menopausal  Other Topics Concern   Not on file  Social History Narrative   Not on file   Social Determinants of Health   Financial Resource Strain: Not on file  Food Insecurity: Food Insecurity Present (06/01/2022)   Hunger Vital Sign    Worried About Running Out of Food in the Last Year: Sometimes true    Ran Out of Food in the Last Year: Never true  Transportation Needs: No Transportation Needs (10/25/2022)   PRAPARE - Administrator, Civil Service (Medical): No    Lack of Transportation (Non-Medical): No  Physical Activity: Not on file  Stress: Not on file  Social Connections: Moderately Isolated (06/01/2022)   Social Connection and Isolation Panel [NHANES]    Frequency of Communication with Friends and Family: More than three times a week    Frequency of Social Gatherings with Friends and Family: More than three times a week    Attends Religious Services: More than 4 times per year    Active Member of [employer] or Organizations: No    Attends Banker Meetings: Never    Marital Status: Widowed  Intimate Partner Violence: Not At Risk (06/01/2022)   Humiliation, Afraid, Rape, and Kick questionnaire    Fear of Current or Ex-Partner: No    Emotionally Abused: No    Physically Abused: No    Sexually Abused: No    Review of Systems: ROS negative except for what is noted on the assessment and plan.  Vitals:   12/11/22 1022  BP: 120/74  Pulse: 98  Temp: 98.6 F (37 C)  TempSrc: Oral  SpO2: 99%  Height: 5\' 1"  (1.549 m)    Physical  Exam: Constitutional: well-appearing female sitting in wheelchair, in no acute distress HENT: normocephalic atraumatic, mucous membranes moist Eyes: conjunctiva non-erythematous Cardiovascular: regular rate and rhythm, no m/r/g Pulmonary/Chest: normal work of breathing on room air, lungs clear to auscultation bilaterally Abdominal: soft, non-tender, non-distended MSK: normal bulk and tone Neurological: alert & oriented x 3, no focal deficit Skin: warm and dry, 10+  1cm picker's lesions on the b/l arms most of which are scarred/healing and all without signs of infection Psych: normal mood and behavior  Assessment & Plan:     Patient seen with Dr. Oswaldo Done  Substance use disorder Ms Skates was very open today about her consuption of cocaine and was tearful at times. She takes the drug because she enjoys the stimulating effect, noting how energetic it makes her feel (review of labs I do not currently suspect a major physiologic cause for low energy, although her low vitD could contribute to mood symptoms along these lines). She successfully abstained for two years in the past with the help of [facility], but has since relapsed. In addition, frequent encouragement helps her very much. We talked about how her cocaine consuption can cause or exacerbate the skin picking compulsions she is feeling, as well as impacts to other areas of her health. She feels that she is ready to quit again and will reach out to [facility] for extra help. PLAN: patient to connect with [facility], will follow closely at future visits  Sensation disturbance of skin Patient feels the sensation of bugs under skin with associated compulsion to pick at the skin of her arms and legs. She has many healing wounds on her arms secondary to this. She maintains a clean environment and I did not see any exam findings suggestive of a cause like scabies, instead I believe it is psycogenic and related to her  cocaine use. She does take gabapentin as well. She intends to stop cocaine use soon, will reevaluate this issue in time and consider medical management if symptoms persist. PLAN: pursue cocaine cessation  Vitamin D deficiency Vit D was 9 in April and her daily vit D was increased.to 50k per week. She does not believe she is taking it regularly. I will not retest this today but will at future visits, giving her time to start taking supplement regularly. She states that she spends very little time in the sun. In addition to this I will monitor for signs of depression as she does report some sadness, but her ROS is generally negative and PHQ unremarkable, so I do not believe she needs pharmacologic management. PLAN: continue vit D supplement and repeat labs in future  Iron deficiency anemia due to chronic blood loss Patient follows with hematology. She has AVM of the small bowel demonstrated in 2019.  Stage 3b chronic kidney disease (CKD) (HCC) Patient follows with nephrology. She is now stage 3b.  Type 2 diabetes mellitus with diabetic neuropathy (HCC) A1C 5.8 on Jardiance and Trulicity. Currently well controlled. She sees an eye doctor. PLAN: continue Jardiance 10mg  every day and Trulicity 0.75 weekly   Essential hypertension, benign At goal without symptoms side effects of medication. PLAN: continue losartan 100, Coreg 6.25 BID, amlodipine 10  Above knee amputation of left lower extremity (HCC) Hx of amputation due to a gangrenous infection of the LLE. She is mobile when she uses her prosthetic leg but prefers to go without at doctors visit. She does have an inner thigh friction abrasion from the prosthesis. 1 inch abrasion without bleeding, drainage, infection. PLAN: Conservative treatment of inner thigh abrasion keeping area dry and covered. Referral for [facility] for leg liners.  Katheran James, D.O. [facility] Internal Medicine, PGY-1 Phone: [phone] Date 12/11/2022 Time  1:49 PM

## 2022-12-11 NOTE — Assessment & Plan Note (Signed)
Hx of amputation due to a gangrenous infection of the LLE. She is mobile when she uses her prosthetic leg but prefers to go without at doctors visit. She does have an inner thigh friction abrasion from the prosthesis. 1 inch abrasion without bleeding, drainage, infection. PLAN: Conservative treatment of inner thigh abrasion keeping area dry and covered. Referral for [facility] for leg liners.

## 2022-12-11 NOTE — Assessment & Plan Note (Signed)
>>  ASSESSMENT AND PLAN FOR TYPE 2 DIABETES MELLITUS WITH DIABETIC NEUROPATHY (HCC) WRITTEN ON 12/11/2022  1:42 PM BY JUBERG, CHRISTOPHER, DO  A1C 5.8 on Jardiance  and Trulicity . Currently well controlled. She sees an eye doctor. PLAN: continue Jardiance  10mg  every day and Trulicity  0.75 weekly

## 2022-12-11 NOTE — Assessment & Plan Note (Addendum)
[name] was very open today about her consuption of cocaine and was tearful at times. She takes the drug because she enjoys the stimulating effect, noting how energetic it makes her feel (review of labs I do not currently suspect a major physiologic cause for low energy, although her low vitD could contribute to mood symptoms along these lines). She successfully abstained for two years in the past with the help of [facility], but has since relapsed. In addition, frequent encouragement helps her very much. We talked about how her cocaine consuption can cause or exacerbate the skin picking compulsions she is feeling, as well as impacts to other areas of her health. She feels that she is ready to quit again and will reach out to [facility] for extra help. PLAN: patient to connect with [facility], will follow closely at future visits

## 2022-12-11 NOTE — Assessment & Plan Note (Addendum)
Patient follows with nephrology. She is now stage 3b.

## 2022-12-11 NOTE — Assessment & Plan Note (Addendum)
Vit D was 9 in April and her daily vit D was increased.to 50k per week. She does not believe she is taking it regularly. I will not retest this today but will at future visits, giving her time to start taking supplement regularly. She states that she spends very little time in the sun. In addition to this I will monitor for signs of depression as she does report some sadness, but her ROS is generally negative and PHQ unremarkable, so I do not believe she needs pharmacologic management. PLAN: continue vit D supplement and repeat labs in future

## 2022-12-11 NOTE — Patient Instructions (Addendum)
[name],  Thank you for coming in today! This after visit summary is an important review of tests, referrals, and medication changes that were discussed during your visit. If you have questions or concerns, call the clinic at [phone] between 9am - 4pm. Outside of clinic business hours, call the main hospital at [phone] and ask the operator for the on-call internal medicine resident.   It was a pleasure to meet you and I look forward to future visits, Best regards, Dr. Katheran James

## 2022-12-11 NOTE — Assessment & Plan Note (Addendum)
Patient feels the sensation of bugs under skin with associated compulsion to pick at the skin of her arms and legs. She has many healing wounds on her arms secondary to this. She maintains a clean environment and I did not see any exam findings suggestive of a cause like scabies, instead I believe it is psycogenic and related to her cocaine use. She does take gabapentin as well. She intends to stop cocaine use soon, will reevaluate this issue in time and consider medical management if symptoms persist. PLAN: pursue cocaine cessation

## 2022-12-11 NOTE — Assessment & Plan Note (Signed)
A1C 5.8 on Jardiance and Trulicity. Currently well controlled. She sees an eye doctor. PLAN: continue Jardiance 10mg  every day and Trulicity 0.75 weekly

## 2022-12-11 NOTE — Assessment & Plan Note (Signed)
Patient follows with hematology. She has AVM of the small bowel demonstrated in 2019.

## 2022-12-11 NOTE — Assessment & Plan Note (Signed)
At goal without symptoms side effects of medication. PLAN: continue losartan 100, Coreg 6.25 BID, amlodipine 10

## 2022-12-12 NOTE — Progress Notes (Signed)
Internal Medicine Clinic Attending  I was physically present during the key portions of the resident provided service and participated in the medical decision making of patient's management care. I reviewed pertinent patient test results.  The assessment, diagnosis, and plan were formulated together and I agree with the documentation in the resident's note.  Erlinda Hong, MD FACP

## 2022-12-14 ENCOUNTER — Other Ambulatory Visit (HOSPITAL_COMMUNITY): Payer: Self-pay

## 2022-12-14 MED ORDER — DAPAGLIFLOZIN PROPANEDIOL 10 MG PO TABS
10.0000 mg | ORAL_TABLET | Freq: Every day | ORAL | 12 refills | Status: DC
  Filled 2022-12-14: qty 30, 30d supply, fill #0
  Filled 2023-01-16: qty 30, 30d supply, fill #1
  Filled 2023-02-08: qty 30, 30d supply, fill #2
  Filled 2023-03-12: qty 30, 30d supply, fill #3
  Filled 2023-04-18: qty 30, 30d supply, fill #4
  Filled 2023-05-20: qty 30, 30d supply, fill #5
  Filled 2023-06-24: qty 30, 30d supply, fill #6
  Filled 2023-07-26: qty 30, 30d supply, fill #7
  Filled 2023-08-07 – 2023-08-21 (×2): qty 30, 30d supply, fill #8
  Filled 2023-09-12 – 2023-09-17 (×2): qty 30, 30d supply, fill #0
  Filled 2023-10-17: qty 30, 30d supply, fill #1
  Filled 2023-11-12: qty 30, 30d supply, fill #2
  Filled 2023-12-09: qty 30, 30d supply, fill #3

## 2022-12-24 ENCOUNTER — Inpatient Hospital Stay: Payer: 59

## 2023-01-03 ENCOUNTER — Encounter: Payer: 59 | Admitting: Student

## 2023-01-07 ENCOUNTER — Other Ambulatory Visit: Payer: Self-pay | Admitting: Student

## 2023-01-07 ENCOUNTER — Other Ambulatory Visit: Payer: Self-pay

## 2023-01-07 ENCOUNTER — Other Ambulatory Visit (HOSPITAL_COMMUNITY): Payer: Self-pay

## 2023-01-07 DIAGNOSIS — Z794 Long term (current) use of insulin: Secondary | ICD-10-CM

## 2023-01-07 DIAGNOSIS — I10 Essential (primary) hypertension: Secondary | ICD-10-CM

## 2023-01-07 NOTE — Telephone Encounter (Signed)
Next appt scheduled  8/21 with Dr Hessie Diener.

## 2023-01-08 ENCOUNTER — Other Ambulatory Visit (HOSPITAL_COMMUNITY): Payer: Self-pay

## 2023-01-08 ENCOUNTER — Other Ambulatory Visit: Payer: Self-pay

## 2023-01-08 MED ORDER — LOSARTAN POTASSIUM 100 MG PO TABS
100.0000 mg | ORAL_TABLET | Freq: Every day | ORAL | 1 refills | Status: DC
Start: 2023-01-08 — End: 2023-06-13
  Filled 2023-01-08: qty 90, 90d supply, fill #0
  Filled 2023-04-18: qty 90, 90d supply, fill #1

## 2023-01-13 NOTE — Progress Notes (Deleted)
Patient Care Team: Katheran James, DO as PCP - General Ernesto Rutherford, MD as Consulting Physician (Ophthalmology) Abigail Miyamoto, MD as Consulting Physician (General Surgery) Malachy Mood, MD as Consulting Physician (Hematology) Comer, Belia Heman, MD as Consulting Physician (Infectious Diseases) Danis, Andreas Blower, MD as Consulting Physician (Gastroenterology)   CHIEF COMPLAINT: Follow up anemia   Oncology History   No history exists.     CURRENT THERAPY: Observation CBC/iron studies q3 months, previously received IV iron (last Feraheme 5/10, 5/17) and bevacizumab (last 09/28/22)   INTERVAL HISTORY [name] returns for follow up as scheduled, last seen by Dr. Mosetta Putt 10/26/22. She continues q3 month follow up. Labs 12/06/22 showed normal hgb, ferritin 49, and +UDS  ROS   Past Medical History:  Diagnosis Date   Allergic rhinitis    Anemia, iron deficiency 05/03/2011   2/2 GI AVMs - recieves iron infusions as well as periodic red blood cell transfusions   Anxiety    Arteriovenous malformation of duodenum 02/25/2015   CARPAL TUNNEL RELEASE, HX OF 07/22/2008   Chest pain 10/28/2019   Colon polyps    Diabetes mellitus without complication (HCC)    type 2   Difficulty sleeping    takes trazadone for sleep   Diverticulosis    Electrolyte abnormality 01/25/2021   Fall 01/03/2020   GERD (gastroesophageal reflux disease)    GI bleed 07/07/2020   Hepatitis C    in SVR with Harvoni 12/2014-02/2015.    History of hernia repair 08/18/2012   History of porphyria 11/30/2014   Porphyria cutanea tarda in setting of chronic Hepatitis C, frequent  IV iron infusions, and alcohol abuse    Hypertension    Hypocalcemia 10/28/2019   Hypocalcemia 01/19/2021   Hypokalemia 09/27/2014   Hypokalemia 01/19/2021   Hypomagnesemia 01/19/2021   Kidney disease 02/04/2019   Peripheral vascular disease (HCC)    Prolonged QT interval 01/19/2021   Stroke (HCC) 2010   no residual deficits   Substance use disorder     cocaine   Tachycardia 07/10/2017   Trichomonas vaginitis 04/16/2019     Past Surgical History:  Procedure Laterality Date   ABDOMINAL HYSTERECTOMY     APPLICATION OF WOUND VAC N/A 06/21/2014   Procedure: APPLICATION OF WOUND VAC;  Surgeon: Abigail Miyamoto, MD;  Location: [facility] OR;  Service: General;  Laterality: N/A;   CARPAL TUNNEL RELEASE     rt hand   COLONOSCOPY WITH PROPOFOL N/A 03/17/2018   Procedure: COLONOSCOPY WITH PROPOFOL;  Surgeon: Sherrilyn Rist, MD;  Location: [facility];  Service: Gastroenterology;  Laterality: N/A;   ENTEROSCOPY N/A 12/04/2012   Procedure: ENTEROSCOPY;  Surgeon: Theda Belfast, MD;  Location: [facility];  Service: Endoscopy;  Laterality: N/A;   ENTEROSCOPY N/A 12/18/2012   Procedure: ENTEROSCOPY;  Surgeon: Theda Belfast, MD;  Location: [facility];  Service: Endoscopy;  Laterality: N/A;   ENTEROSCOPY N/A 02/25/2015   Procedure: ENTEROSCOPY;  Surgeon: Louis Meckel, MD;  Location: [facility] ENDOSCOPY;  Service: Endoscopy;  Laterality: N/A;   ENTEROSCOPY N/A 10/29/2017   Procedure: ENTEROSCOPY;  Surgeon: Sherrilyn Rist, MD;  Location: [facility];  Service: Gastroenterology;  Laterality: N/A;   ENTEROSCOPY N/A 07/08/2020   Procedure: ENTEROSCOPY;  Surgeon: Rachael Fee, MD;  Location: [facility];  Service: Endoscopy;  Laterality: N/A;   ESOPHAGOGASTRODUODENOSCOPY  05/05/2011   Procedure: ESOPHAGOGASTRODUODENOSCOPY (EGD);  Surgeon: Erick Blinks, MD;  Location: [facility] ENDOSCOPY;  Service: Gastroenterology;  Laterality: N/A;  Dr. Rhea Belton will do  procedure for Dr. [name] Saturday.   ESOPHAGOGASTRODUODENOSCOPY  05/08/2011   Procedure: ESOPHAGOGASTRODUODENOSCOPY (EGD);  Surgeon: Theda Belfast;  Location: [facility];  Service: Endoscopy;  Laterality: N/A;   ESOPHAGOGASTRODUODENOSCOPY  06/07/2011   Procedure: ESOPHAGOGASTRODUODENOSCOPY (EGD);  Surgeon: Theda Belfast, MD;  Location: [facility] ENDOSCOPY;  Service: Endoscopy;  Laterality: N/A;   ESOPHAGOGASTRODUODENOSCOPY   12/20/2011   Procedure: ESOPHAGOGASTRODUODENOSCOPY (EGD);  Surgeon: Theda Belfast, MD;  Location: [facility] ENDOSCOPY;  Service: Endoscopy;  Laterality: N/A;   EYE SURGERY Bilateral    cataracts   FLEXIBLE SIGMOIDOSCOPY  12/21/2011   Procedure: FLEXIBLE SIGMOIDOSCOPY;  Surgeon: Theda Belfast, MD;  Location: [facility];  Service: Endoscopy;  Laterality: N/A;   HOT HEMOSTASIS  06/07/2011   Procedure: HOT HEMOSTASIS (ARGON PLASMA COAGULATION/BICAP);  Surgeon: Theda Belfast, MD;  Location: [facility] ENDOSCOPY;  Service: Endoscopy;  Laterality: N/A;   HOT HEMOSTASIS N/A 07/08/2020   Procedure: HOT HEMOSTASIS (ARGON PLASMA COAGULATION/BICAP);  Surgeon: Rachael Fee, MD;  Location: [facility] ENDOSCOPY;  Service: Endoscopy;  Laterality: N/A;   INCISIONAL HERNIA REPAIR N/A 06/01/2014   Procedure: ATTEMPTED LAPAROSCOPIC AND OPEN INCISIONAL HERNIA REPAIR WITH MESH;  Surgeon: Abigail Miyamoto, MD;  Location[facility] OR;  Service: General;  Laterality: N/A;   INSERTION OF MESH N/A 06/01/2014   Procedure: INSERTION OF MESH;  Surgeon: Abigail Miyamoto, MD;  Location: [facility] OR;  Service: General;  Laterality: N/A;   LAPAROTOMY  02/16/2012   Procedure: EXPLORATORY LAPAROTOMY;  Surgeon: Mariella Saa, MD;  Location: [facility] ORS;  Service: General;  Laterality: N/A;  oversewing of anastomotic leak and rigid sigmoidoscopy   LAPAROTOMY N/A 06/21/2014   Procedure: ABDOMINAL WOUND EXPLORATION;  Surgeon: Abigail Miyamoto, MD;  Location: [facility] OR;  Service: General;  Laterality: N/A;   LEG AMPUTATION ABOVE KNEE     left   PARTIAL COLECTOMY  02/15/2012   Procedure: PARTIAL COLECTOMY;  Surgeon: Shelly Rubenstein, MD;  Location: [facility];  Service: General;  Laterality: N/A;   SUBMUCOSAL INJECTION  07/08/2020   Procedure: SUBMUCOSAL INJECTION;  Surgeon: Rachael Fee, MD;  Location: [facility];  Service: Endoscopy;;   TONSILLECTOMY     WRIST FUSION Right 04/27/2021   Procedure: SCAPHOID EXCISION  WITH 4 BONE FUSION RIGHT WRIST;  Surgeon: Betha Loa, MD;  Location: [facility];  Service: Orthopedics;  Laterality: Right;     Outpatient Encounter Medications as of 01/14/2023  Medication Sig   ACCU-CHEK FASTCLIX LANCETS MISC Use to test blood glucose 3 times daily.   amLODipine (NORVASC) 10 MG tablet Take 1 tablet (10 mg total) by mouth daily.   Blood Glucose Monitoring Suppl (ACCU-CHEK GUIDE) w/Device KIT 1 each by Does not apply route 3 (three) times daily. The patient is insulin requiring, ICD 10 code E11.65. The patient tests 3 times a day.   calcium-vitamin D (OSCAL WITH D) 500-200 MG-UNIT tablet Take 1 tablet by mouth 2 (two) times daily.   camphor-menthol (SARNA) lotion Apply 1 Application topically as needed for itching.   carvedilol (COREG) 6.25 MG tablet Take 2 tablets (12.5 mg total) by mouth daily.   colchicine 0.6 MG tablet Take 0.6 mg by mouth daily as needed.   dapagliflozin propanediol (FARXIGA) 10 MG TABS tablet Take 1 tablet by mouth daily.   Dulaglutide (TRULICITY) 0.75 MG/0.5ML SOPN Inject 0.75 mg into the skin once a week.   empagliflozin (JARDIANCE) 10 MG TABS tablet Take 1 tablet (10 mg total) by mouth daily before breakfast.   famotidine (PEPCID)  20 MG tablet Take 1 tablet (20 mg total) by mouth 2 (two) times daily.   folic acid (FOLVITE) 1 MG tablet Take 1 tablet (1 mg total) by mouth daily.   gabapentin (NEURONTIN) 400 MG capsule Take 1 capsule (400 mg total) by mouth in the morning AND 1 capsule (400 mg total) daily in the afternoon AND 2 capsules (800 mg total) before bedtime.   glucose blood (ACCU-CHEK GUIDE) test strip Use as instructed   losartan (COZAAR) 100 MG tablet Take 1 tablet (100 mg total) by mouth daily.   magnesium oxide (MAG-OX) 400 (240 Mg) MG tablet Take 1 tablet (400 mg total) by mouth daily.   omeprazole (PRILOSEC) 20 MG capsule Take 1 capsule (20 mg total) by mouth daily.   ondansetron (ZOFRAN) 4 MG tablet Take 1 tablet (4 mg total) by mouth every 8 (eight) hours as needed for  nausea or vomiting.   rosuvastatin (CRESTOR) 20 MG tablet Take 1 tablet (20 mg total) by mouth daily.   No facility-administered encounter medications on file as of 01/14/2023.     There were no vitals filed for this visit. There is no height or weight on file to calculate BMI.   PHYSICAL EXAM GENERAL:alert, no distress and comfortable SKIN: no rash  EYES: sclera clear NECK: without mass LYMPH:  no palpable cervical or supraclavicular lymphadenopathy  LUNGS: clear with normal breathing effort HEART: regular rate & rhythm, no lower extremity edema ABDOMEN: abdomen soft, non-tender and normal bowel sounds NEURO: alert & oriented x 3 with fluent speech, no focal motor/sensory deficits Breast exam:  PAC without erythema    CBC    Component Value Date/Time   WBC 8.0 12/06/2022 0731   WBC 8.7 11/21/2021 0940   RBC 4.37 12/06/2022 0731   HGB 12.4 12/06/2022 0731   HGB 8.0 (L) 07/10/2017 1639   HGB 11.3 (L) 01/31/2017 1416   HCT 38.4 12/06/2022 0731   HCT 33.8 (L) 08/10/2020 0753   HCT 33.9 (L) 01/31/2017 1416   PLT 304 12/06/2022 0731   PLT 450 (H) 07/10/2017 1639   MCV 87.9 12/06/2022 0731   MCV 91 07/10/2017 1639   MCV 97.3 01/31/2017 1416   MCH 28.4 12/06/2022 0731   MCHC 32.3 12/06/2022 0731   RDW 19.4 (H) 12/06/2022 0731   RDW 18.9 (H) 07/10/2017 1639   RDW 15.2 (H) 01/31/2017 1416   LYMPHSABS 1.2 12/06/2022 0731   LYMPHSABS 1.8 01/31/2017 1416   MONOABS 1.0 12/06/2022 0731   MONOABS 0.9 01/31/2017 1416   EOSABS 0.1 12/06/2022 0731   EOSABS 0.2 01/31/2017 1416   BASOSABS 0.1 12/06/2022 0731   BASOSABS 0.0 01/31/2017 1416     CMP     Component Value Date/Time   NA 141 12/06/2022 0731   NA 141 07/31/2022 0940   NA 141 01/31/2017 1416   K 4.2 12/06/2022 0731   K 4.4 01/31/2017 1416   CL 107 12/06/2022 0731   CL 104 10/03/2012 1048   CO2 26 12/06/2022 0731   CO2 22 01/31/2017 1416   GLUCOSE 81 12/06/2022 0731   GLUCOSE 104 01/31/2017 1416   GLUCOSE 154 (H)  10/03/2012 1048   BUN 19 12/06/2022 0731   BUN 28 (H) 07/31/2022 0940   BUN 14.0 01/31/2017 1416   CREATININE 1.57 (H) 12/06/2022 0731   CREATININE 0.9 01/31/2017 1416   CALCIUM 9.3 12/06/2022 0731   CALCIUM 6.6 (LL) 01/19/2021 0906   CALCIUM 9.0 01/31/2017 1416   PROT 7.1 12/06/2022 0731  PROT 6.8 07/31/2022 0940   PROT 7.1 01/31/2017 1416   ALBUMIN 3.7 12/06/2022 0731   ALBUMIN 4.1 07/31/2022 0940   ALBUMIN 3.4 (L) 01/31/2017 1416   AST 14 (L) 12/06/2022 0731   AST 25 01/31/2017 1416   ALT 11 12/06/2022 0731   ALT 21 01/31/2017 1416   ALKPHOS 89 12/06/2022 0731   ALKPHOS 71 01/31/2017 1416   BILITOT 0.3 12/06/2022 0731   BILITOT 0.26 01/31/2017 1416   GFRNONAA 35 (L) 12/06/2022 0731   GFRNONAA >89 11/30/2014 1608   GFRAA 53 (L) 02/25/2020 1021   GFRAA >89 11/30/2014 1608     ASSESSMENT & PLAN:[name] is a 72 y.o. female with     1. Iron deficiency anemia secondary to GI blood loss, and Anemia of Chronic Disease -She has a longstanding history of anemia dating back at least 2008 Hg was 10, then periodically normal in 2010 until she presented with nadir Hg 4.4 on 02/22/2010. -Her various work-up showed normal reticulocyte in 04/2010, elevated B12/folate on 03/28/2012, normal haptoglobin on 01/02/2018, and normal SPEP on 12/16/2019 except elevated light chains with normal ratio.  - History of multiple GI bleeding in 2016, required blood transfusion, hospitalization and IV Feraheme -She had an EGD and colonoscopy on 10/29/2017 and 03/17/2018 with Dr. Myrtie Neither, which was normal. Endoscopy in [facility] and was found to have AVM in small bowel. -Goal is to keep her Hg >10 and ferritin>100. Her last IV Feraheme was on 06/29/2020   2. 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 Chronic hepatitis C infection -She tested positive for hepatitis C in 2009. She has previously completed treatment for hepatitis C in 2016 and repeated test was negative. -05/2018  liver US was unremarkable.  -She gets 2 flu shots a year (March and October) and she received her COVID booster in 02/2020.              PLAN:  No orders of the defined types were placed in this encounter.     All questions were answered. The patient knows to call the clinic with any problems, questions or concerns. No barriers to learning were detected. I spent *** counseling the patient face to face. The total time spent in the appointment was *** and more than 50% was on counseling, review of test results, and coordination of care.   Patricia Glad, NP-C @DATE @

## 2023-01-14 ENCOUNTER — Inpatient Hospital Stay: Payer: 59

## 2023-01-14 ENCOUNTER — Inpatient Hospital Stay: Payer: 59 | Attending: Hematology | Admitting: Nurse Practitioner

## 2023-01-16 ENCOUNTER — Telehealth: Payer: Self-pay | Admitting: Nurse Practitioner

## 2023-01-16 ENCOUNTER — Encounter: Payer: Self-pay | Admitting: Student

## 2023-01-16 ENCOUNTER — Ambulatory Visit (INDEPENDENT_AMBULATORY_CARE_PROVIDER_SITE_OTHER): Payer: 59 | Admitting: Student

## 2023-01-16 ENCOUNTER — Other Ambulatory Visit (HOSPITAL_COMMUNITY): Payer: Self-pay

## 2023-01-16 VITALS — BP 123/76 | HR 80 | Temp 98.1°F | Ht 61.0 in | Wt 154.4 lb

## 2023-01-16 DIAGNOSIS — Z89619 Acquired absence of unspecified leg above knee: Secondary | ICD-10-CM

## 2023-01-16 DIAGNOSIS — S78112A Complete traumatic amputation at level between left hip and knee, initial encounter: Secondary | ICD-10-CM

## 2023-01-16 DIAGNOSIS — M10371 Gout due to renal impairment, right ankle and foot: Secondary | ICD-10-CM

## 2023-01-16 MED ORDER — METOPROLOL SUCCINATE ER 50 MG PO TB24
50.0000 mg | ORAL_TABLET | Freq: Every day | ORAL | 11 refills | Status: DC
Start: 2023-01-16 — End: 2023-06-13
  Filled 2023-01-16: qty 30, 30d supply, fill #0
  Filled 2023-02-08: qty 30, 30d supply, fill #1
  Filled 2023-03-12: qty 30, 30d supply, fill #2

## 2023-01-16 MED ORDER — ALLOPURINOL 100 MG PO TABS
50.0000 mg | ORAL_TABLET | Freq: Every day | ORAL | 2 refills | Status: DC
  Filled 2023-01-16 (×2): qty 30, 60d supply, fill #0
  Filled 2023-03-12: qty 30, 60d supply, fill #1

## 2023-01-16 MED ORDER — COLCHICINE 0.6 MG PO TABS
ORAL_TABLET | ORAL | 0 refills | Status: DC
Start: 2023-01-16 — End: 2023-02-08
  Filled 2023-01-16: qty 30, 28d supply, fill #0
  Filled 2023-01-16: qty 30, 15d supply, fill #0

## 2023-01-16 NOTE — Progress Notes (Signed)
uri  Established Patient Office Visit  Subjective   Patient ID: [name], female    DOB: Apr 13, 1951  Age: 72 y.o. MRN: [id number]  Chief Complaint  Patient presents with   Diabetes   Medication Refill   DME    Left leg prosthetic    TYIA MCGRAIN is a 72 year old female with a past medical history of HTN, T2DM, and substance use disorder who presents to the office today for acute gout flare and need for new prosthetic supplies.  Please see problem based assessment and plan for additional details.   Past Medical History:  Diagnosis Date   Allergic rhinitis    Anemia, iron deficiency 05/03/2011   2/2 GI AVMs - recieves iron infusions as well as periodic red blood cell transfusions   Anxiety    Arteriovenous malformation of duodenum 02/25/2015   CARPAL TUNNEL RELEASE, HX OF 07/22/2008   Chest pain 10/28/2019   Colon polyps    Diabetes mellitus without complication (HCC)    type 2   Difficulty sleeping    takes trazadone for sleep   Diverticulosis    Electrolyte abnormality 01/25/2021   Fall 01/03/2020   GERD (gastroesophageal reflux disease)    GI bleed 07/07/2020   Hepatitis C    in SVR with Harvoni 12/2014-02/2015.    History of hernia repair 08/18/2012   History of porphyria 11/30/2014   Porphyria cutanea tarda in setting of chronic Hepatitis C, frequent  IV iron infusions, and alcohol abuse    Hypertension    Hypocalcemia 10/28/2019   Hypocalcemia 01/19/2021   Hypokalemia 09/27/2014   Hypokalemia 01/19/2021   Hypomagnesemia 01/19/2021   Kidney disease 02/04/2019   Peripheral vascular disease (HCC)    Prolonged QT interval 01/19/2021   Stroke (HCC) 2010   no residual deficits   Substance use disorder    cocaine   Tachycardia 07/10/2017   Trichomonas vaginitis 04/16/2019       Objective:     BP 123/76 (BP Location: Left Arm, Patient Position: Sitting, Cuff Size: Normal)   Pulse 80   Temp 98.1 F (36.7 C) (Oral)   Ht 5\' 1"  (1.549 m)   Wt 154 lb 6.4 oz (70 kg)    SpO2 100%   BMI 29.17 kg/m  BP Readings from Last 3 Encounters:  01/16/23 123/76  12/11/22 120/74  11/09/22 130/85      Physical Exam Vitals reviewed.  Constitutional:      Appearance: Normal appearance. She is overweight.  Cardiovascular:     Rate and Rhythm: Normal rate and regular rhythm.     Heart sounds: Normal heart sounds. No murmur heard. Pulmonary:     Effort: Pulmonary effort is normal. No respiratory distress.     Breath sounds: Normal breath sounds.  Musculoskeletal:        General: Tenderness (Right MTP tenderness, swelling, erythema, warmth.  Bunion present as well) present.     Comments: Left AKA present  Skin:    General: Skin is warm and dry.  Neurological:     Mental Status: She is alert and oriented to person, place, and time.  Psychiatric:        Mood and Affect: Affect is tearful.        Behavior: Behavior is cooperative.      No results found for any visits on 01/16/23.  Last CBC Lab Results  Component Value Date   WBC 8.0 12/06/2022   HGB 12.4 12/06/2022   HCT 38.4 12/06/2022  MCV 87.9 12/06/2022   MCH 28.4 12/06/2022   RDW 19.4 (H) 12/06/2022   PLT 304 12/06/2022   Last metabolic panel Lab Results  Component Value Date   GLUCOSE 81 12/06/2022   NA 141 12/06/2022   K 4.2 12/06/2022   CL 107 12/06/2022   CO2 26 12/06/2022   BUN 19 12/06/2022   CREATININE 1.57 (H) 12/06/2022   GFRNONAA 35 (L) 12/06/2022   CALCIUM 9.3 12/06/2022   PHOS 5.6 (H) 01/19/2021   PROT 7.1 12/06/2022   ALBUMIN 3.7 12/06/2022   LABGLOB 2.7 07/31/2022   AGRATIO 1.5 07/31/2022   BILITOT 0.3 12/06/2022   ALKPHOS 89 12/06/2022   AST 14 (L) 12/06/2022   ALT 11 12/06/2022   ANIONGAP 8 12/06/2022   Last lipids Lab Results  Component Value Date   CHOL 164 03/07/2022   HDL 35 (L) 03/07/2022   LDLCALC 106 (H) 03/07/2022   TRIG 125 03/07/2022   CHOLHDL 4.7 (H) 03/07/2022   Last hemoglobin A1c Lab Results  Component Value Date   HGBA1C 5.3 09/03/2022       The ASCVD Risk score (Arnett DK, et al., 2019) failed to calculate for the following reasons:   The patient has a prior MI or stroke diagnosis    Assessment & Plan:   Problem List Items Addressed This Visit       Musculoskeletal and Integument   Gout involving toe of right foot - Primary (Chronic)    Patient reports history of gout that was previously treated with allopurinol and colchicine.  She presents today with an acute gout flare that began 2 to 3 weeks ago on her right MTP joint.  She is currently experiencing pain and swelling of the MTP joint.  Physical exam is remarkable for right MTP tenderness, warmth, erythema, and swelling.  She also has a right bunion located at the MTP joint as well.  Due to the patient's history of CKD stage 3b, a low dose of colchicine 0.6 mg was started along with a low-dose of allopurinol at 50 mg.  Would like to avoid corticosteroids due to type 2 diabetes mellitus. 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  She has a prosthetic leg in place but has been experiencing pain and is requesting an order for new liners for her prosthetic leg. Plan -DME order placed for new liners -Patient was notified to call the [facility] and schedule an appointment to have the new liners fitted        Return in about 2 months (around 03/18/2023) for Diabetes follow-up.    Faith Rogue, DO

## 2023-01-16 NOTE — Assessment & Plan Note (Signed)
Patient has a left AKA secondary to gangrenous infection of the left lower extremity.  She has a prosthetic leg in place but has been experiencing pain and is requesting an order for new liners for her prosthetic leg. Plan -DME order placed for new liners -Patient was notified to call the [facility] and schedule an appointment to have the new liners fitted

## 2023-01-16 NOTE — Assessment & Plan Note (Addendum)
Patient reports history of gout that was previously treated with allopurinol and colchicine.  She presents today with an acute gout flare that began 2 to 3 weeks ago on her right MTP joint.  She is currently experiencing pain and swelling of the MTP joint.  Physical exam is remarkable for right MTP tenderness, warmth, erythema, and swelling.  She also has a right bunion located at the MTP joint as well.  Due to the patient's history of CKD stage 3b, a low dose of colchicine 0.6 mg was started along with a low-dose of allopurinol at 50 mg.  Would like to avoid corticosteroids due to type 2 diabetes mellitus. Plan -Begin colchicine 0.6 mg -Begin allopurinol 50 mg 1 week after beginning colchicine -Avoid corticosteroids -Uric acid level ordered -Coreg discontinued and metoprolol succinate 50 mg started due to beginning colchicine in the setting of CKD stage IIIb

## 2023-01-16 NOTE — Patient Instructions (Signed)
Thank you, Ms.[name] for allowing [place] to provide your care today. Today we discussed gout, prosthetic supplies.    I have ordered the following labs for you:   Lab Orders         Uric acid        I have ordered the following medication/changed the following medications:   Stop the following medications: Medications Discontinued During This Encounter  Medication Reason   empagliflozin (JARDIANCE) 10 MG TABS tablet Change in therapy   ondansetron (ZOFRAN) 4 MG tablet Completed Course   colchicine 0.6 MG tablet Reorder     Start the following medications: No orders of the defined types were placed in this encounter.    Follow up: 2-3 months    Remember: Begin colchicine treatment for the gout.  After being on colchicine for 1 week begin allopurinol 50 mg.  Should you have any questions or concerns please call the internal medicine clinic at [phone].     Faith Rogue, D.O. [facility]

## 2023-01-17 ENCOUNTER — Encounter: Payer: Self-pay | Admitting: Student

## 2023-01-17 LAB — URIC ACID: Uric Acid: 3.6 mg/dL (ref 3.0–7.2)

## 2023-01-17 NOTE — Progress Notes (Signed)
Letter sent.

## 2023-01-21 ENCOUNTER — Telehealth: Payer: Self-pay | Admitting: Nurse Practitioner

## 2023-01-21 NOTE — Progress Notes (Signed)
Internal Medicine Clinic Attending  I was physically present during the key portions of the resident provided service and participated in the medical decision making of patient's management care. I reviewed pertinent patient test results.  The assessment, diagnosis, and plan were formulated together and I agree with the documentation in the resident's note.  Gust Rung, DO

## 2023-01-23 NOTE — Progress Notes (Deleted)
Patient Care Team: Katheran James, DO as PCP - General Ernesto Rutherford, MD as Consulting Physician (Ophthalmology) Abigail Miyamoto, MD as Consulting Physician (General Surgery) Malachy Mood, MD as Consulting Physician (Hematology) Comer, Belia Heman, MD as Consulting Physician (Infectious Diseases) Danis, Andreas Blower, MD as Consulting Physician (Gastroenterology)   CHIEF COMPLAINT:   Oncology History   No history exists.     CURRENT THERAPY:   INTERVAL HISTORY   ROS   Past Medical History:  Diagnosis Date  . Allergic rhinitis   . Anemia, iron deficiency 05/03/2011   2/2 GI AVMs - recieves iron infusions as well as periodic red blood cell transfusions  . Anxiety   . Arteriovenous malformation of duodenum 02/25/2015  . CARPAL TUNNEL RELEASE, HX OF 07/22/2008  . Chest pain 10/28/2019  . Colon polyps   . Diabetes mellitus without complication (HCC)    type 2  . Difficulty sleeping    takes trazadone for sleep  . Diverticulosis   . Electrolyte abnormality 01/25/2021  . Fall 01/03/2020  . GERD (gastroesophageal reflux disease)   . GI bleed 07/07/2020  . Hepatitis C    in SVR with Harvoni 12/2014-02/2015.   Marland Kitchen History of hernia repair 08/18/2012  . History of porphyria 11/30/2014   Porphyria cutanea tarda in setting of chronic Hepatitis C, frequent  IV iron infusions, and alcohol abuse   . Hypertension   . Hypocalcemia 10/28/2019  . Hypocalcemia 01/19/2021  . Hypokalemia 09/27/2014  . Hypokalemia 01/19/2021  . Hypomagnesemia 01/19/2021  . Kidney disease 02/04/2019  . Peripheral vascular disease (HCC)   . Prolonged QT interval 01/19/2021  . Stroke [facility]) 2010   no residual deficits  . Substance use disorder    cocaine  . Tachycardia 07/10/2017  . Trichomonas vaginitis 04/16/2019     Past Surgical History:  Procedure Laterality Date  . ABDOMINAL HYSTERECTOMY    . APPLICATION OF WOUND VAC N/A 06/21/2014   Procedure: APPLICATION OF WOUND VAC;  Surgeon: Abigail Miyamoto, MD;   Location: [facility] OR;  Service: General;  Laterality: N/A;  . CARPAL TUNNEL RELEASE     rt hand  . COLONOSCOPY WITH PROPOFOL N/A 03/17/2018   Procedure: COLONOSCOPY WITH PROPOFOL;  Surgeon: Sherrilyn Rist, MD;  Location: [facility];  Service: Gastroenterology;  Laterality: N/A;  . ENTEROSCOPY N/A 12/04/2012   Procedure: ENTEROSCOPY;  Surgeon: Theda Belfast, MD;  Location: [facility];  Service: Endoscopy;  Laterality: N/A;  . ENTEROSCOPY N/A 12/18/2012   Procedure: ENTEROSCOPY;  Surgeon: Theda Belfast, MD;  Location: [facility];  Service: Endoscopy;  Laterality: N/A;  . ENTEROSCOPY N/A 02/25/2015   Procedure: ENTEROSCOPY;  Surgeon: Louis Meckel, MD;  Location: [facility] ENDOSCOPY;  Service: Endoscopy;  Laterality: N/A;  . ENTEROSCOPY N/A 10/29/2017   Procedure: ENTEROSCOPY;  Surgeon: Sherrilyn Rist, MD;  Location: [facility];  Service: Gastroenterology;  Laterality: N/A;  . ENTEROSCOPY N/A 07/08/2020   Procedure: ENTEROSCOPY;  Surgeon: Rachael Fee, MD;  Location: [facility];  Service: Endoscopy;  Laterality: N/A;  . ESOPHAGOGASTRODUODENOSCOPY  05/05/2011   Procedure: ESOPHAGOGASTRODUODENOSCOPY (EGD);  Surgeon: Erick Blinks, MD;  Location: [facility]COPY;  Service: Gastroenterology;  Laterality: N/A;  Dr. Rhea Belton will do procedure for Dr. [name] Saturday.  . ESOPHAGOGASTRODUODENOSCOPY  05/08/2011   Procedure: ESOPHAGOGASTRODUODENOSCOPY (EGD);  Surgeon: Theda Belfast;  Location: [facility];  Service: Endoscopy;  Laterality: N/A;  . ESOPHAGOGASTRODUODENOSCOPY  06/07/2011   Procedure: ESOPHAGOGASTRODUODENOSCOPY (EGD);  Surgeon: Theda Belfast, MD;  Location:  [facility];  Service: Endoscopy;  Laterality: N/A;  . ESOPHAGOGASTRODUODENOSCOPY  12/20/2011   Procedure: ESOPHAGOGASTRODUODENOSCOPY (EGD);  Surgeon: Theda Belfast, MD;  Location: [facility]OSCOPY;  Service: Endoscopy;  Laterality: N/A;  . EYE SURGERY Bilateral    cataracts  . FLEXIBLE SIGMOIDOSCOPY  12/21/2011   Procedure: FLEXIBLE  SIGMOIDOSCOPY;  Surgeon: Theda Belfast, MD;  Location: [facility];  Service: Endoscopy;  Laterality: N/A;  . HOT HEMOSTASIS  06/07/2011   Procedure: HOT HEMOSTASIS (ARGON PLASMA COAGULATION/BICAP);  Surgeon: Theda Belfast, MD;  Location: [facility];  Service: Endoscopy;  Laterality: N/A;  . HOT HEMOSTASIS N/A 07/08/2020   Procedure: HOT HEMOSTASIS (ARGON PLASMA COAGULATION/BICAP);  Surgeon: Rachael Fee, MD;  Location: [facility];  Service: Endoscopy;  Laterality: N/A;  . INCISIONAL HERNIA REPAIR N/A 06/01/2014   Procedure: ATTEMPTED LAPAROSCOPIC AND OPEN INCISIONAL HERNIA REPAIR WITH MESH;  Surgeon: Abigail Miyamoto, MD;  Location: [facility] OR;  Service: General;  Laterality: N/A;  . INSERTION OF MESH N/A 06/01/2014   Procedure: INSERTION OF MESH;  Surgeon: Abigail Miyamoto, MD;  Location: [facility] OR;  Service: General;  Laterality: N/A;  . LAPAROTOMY  02/16/2012   Procedure: EXPLORATORY LAPAROTOMY;  Surgeon: Mariella Saa, MD;  Location: [facility] ORS;  Service: General;  Laterality: N/A;  oversewing of anastomotic leak and rigid sigmoidoscopy  . LAPAROTOMY N/A 06/21/2014   Procedure: ABDOMINAL WOUND EXPLORATION;  Surgeon: Abigail Miyamoto, MD;  Location: [facility] OR;  Service: General;  Laterality: N/A;  . LEG AMPUTATION ABOVE KNEE     left  . PARTIAL COLECTOMY  02/15/2012   Procedure: PARTIAL COLECTOMY;  Surgeon: Shelly Rubenstein, MD;  Location: [facility];  Service: General;  Laterality: N/A;  . SUBMUCOSAL INJECTION  07/08/2020   Procedure: SUBMUCOSAL INJECTION;  Surgeon: Rachael Fee, MD;  Location: [facility];  Service: Endoscopy;;  . TONSILLECTOMY    . WRIST FUSION Right 04/27/2021   Procedure: SCAPHOID EXCISION  WITH 4 BONE FUSION RIGHT WRIST;  Surgeon: Betha Loa, MD;  Location: [facility];  Service: Orthopedics;  Laterality: Right;     Outpatient Encounter Medications as of 01/24/2023  Medication Sig  . ACCU-CHEK FASTCLIX LANCETS MISC Use to test blood glucose 3 times daily.   Marland Kitchen allopurinol (ZYLOPRIM) 100 MG tablet Begin medication after one week of colchicine treatment. Take 1/2 tablet (50 mg total) by mouth daily.  Marland Kitchen amLODipine (NORVASC) 10 MG tablet Take 1 tablet (10 mg total) by mouth daily.  . Blood Glucose Monitoring Suppl (ACCU-CHEK GUIDE) w/Device KIT 1 each by Does not apply route 3 (three) times daily. The patient is insulin requiring, ICD 10 code E11.65. The patient tests 3 times a day.  . calcium-vitamin D (OSCAL WITH D) 500-200 MG-UNIT tablet Take 1 tablet by mouth 2 (two) times daily.  . camphor-menthol (SARNA) lotion Apply 1 Application topically as needed for itching.  . colchicine 0.6 MG tablet Take 2 tablets of colchicine today and then one tablet daily.  . dapagliflozin propanediol (FARXIGA) 10 MG TABS tablet Take 1 tablet by mouth daily.  . Dulaglutide (TRULICITY) 0.75 MG/0.5ML SOPN Inject 0.75 mg into the skin once a week.  . famotidine (PEPCID) 20 MG tablet Take 1 tablet (20 mg total) by mouth 2 (two) times daily.  . folic acid (FOLVITE) 1 MG tablet Take 1 tablet (1 mg total) by mouth daily.  Marland Kitchen gabapentin (NEURONTIN) 400 MG capsule Take 1 capsule (400 mg total) by mouth in the morning AND 1 capsule (400 mg total)  daily in the afternoon AND 2 capsules (800 mg total) before bedtime.  Marland Kitchen glucose blood (ACCU-CHEK GUIDE) test strip Use as instructed  . losartan (COZAAR) 100 MG tablet Take 1 tablet (100 mg total) by mouth daily.  . magnesium oxide (MAG-OX) 400 (240 Mg) MG tablet Take 1 tablet (400 mg total) by mouth daily.  . metoprolol succinate (TOPROL XL) 50 MG 24 hr tablet Take 1 tablet (50 mg total) by mouth daily. Take with or immediately following a meal.  . omeprazole (PRILOSEC) 20 MG capsule Take 1 capsule (20 mg total) by mouth daily.  . rosuvastatin (CRESTOR) 20 MG tablet Take 1 tablet (20 mg total) by mouth daily.   No facility-administered encounter medications on file as of 01/24/2023.     There were no vitals filed for this visit. There  is no height or weight on file to calculate BMI.   PHYSICAL EXAM GENERAL:alert, no distress and comfortable SKIN: no rash  EYES: sclera clear NECK: without mass LYMPH:  no palpable cervical or supraclavicular lymphadenopathy  LUNGS: clear with normal breathing effort HEART: regular rate & rhythm, no lower extremity edema ABDOMEN: abdomen soft, non-tender and normal bowel sounds NEURO: alert & oriented x 3 with fluent speech, no focal motor/sensory deficits Breast exam:  PAC without erythema    CBC    Component Value Date/Time   WBC 8.0 12/06/2022 0731   WBC 8.7 11/21/2021 0940   RBC 4.37 12/06/2022 0731   HGB 12.4 12/06/2022 0731   HGB 8.0 (L) 07/10/2017 1639   HGB 11.3 (L) 01/31/2017 1416   HCT 38.4 12/06/2022 0731   HCT 33.8 (L) 08/10/2020 0753   HCT 33.9 (L) 01/31/2017 1416   PLT 304 12/06/2022 0731   PLT 450 (H) 07/10/2017 1639   MCV 87.9 12/06/2022 0731   MCV 91 07/10/2017 1639   MCV 97.3 01/31/2017 1416   MCH 28.4 12/06/2022 0731   MCHC 32.3 12/06/2022 0731   RDW 19.4 (H) 12/06/2022 0731   RDW 18.9 (H) 07/10/2017 1639   RDW 15.2 (H) 01/31/2017 1416   LYMPHSABS 1.2 12/06/2022 0731   LYMPHSABS 1.8 01/31/2017 1416   MONOABS 1.0 12/06/2022 0731   MONOABS 0.9 01/31/2017 1416   EOSABS 0.1 12/06/2022 0731   EOSABS 0.2 01/31/2017 1416   BASOSABS 0.1 12/06/2022 0731   BASOSABS 0.0 01/31/2017 1416     CMP     Component Value Date/Time   NA 141 12/06/2022 0731   NA 141 07/31/2022 0940   NA 141 01/31/2017 1416   K 4.2 12/06/2022 0731   K 4.4 01/31/2017 1416   CL 107 12/06/2022 0731   CL 104 10/03/2012 1048   CO2 26 12/06/2022 0731   CO2 22 01/31/2017 1416   GLUCOSE 81 12/06/2022 0731   GLUCOSE 104 01/31/2017 1416   GLUCOSE 154 (H) 10/03/2012 1048   BUN 19 12/06/2022 0731   BUN 28 (H) 07/31/2022 0940   BUN 14.0 01/31/2017 1416   CREATININE 1.57 (H) 12/06/2022 0731   CREATININE 0.9 01/31/2017 1416   CALCIUM 9.3 12/06/2022 0731   CALCIUM 6.6 (LL) 01/19/2021  0906   CALCIUM 9.0 01/31/2017 1416   PROT 7.1 12/06/2022 0731   PROT 6.8 07/31/2022 0940   PROT 7.1 01/31/2017 1416   ALBUMIN 3.7 12/06/2022 0731   ALBUMIN 4.1 07/31/2022 0940   ALBUMIN 3.4 (L) 01/31/2017 1416   AST 14 (L) 12/06/2022 0731   AST 25 01/31/2017 1416   ALT 11 12/06/2022 0731   ALT 21 01/31/2017  1416   ALKPHOS 89 12/06/2022 0731   ALKPHOS 71 01/31/2017 1416   BILITOT 0.3 12/06/2022 0731   BILITOT 0.26 01/31/2017 1416   GFRNONAA 35 (L) 12/06/2022 0731   GFRNONAA >89 11/30/2014 1608   GFRAA 53 (L) 02/25/2020 1021   GFRAA >89 11/30/2014 1608     ASSESSMENT & PLAN:  PLAN:  No orders of the defined types were placed in this encounter.     All questions were answered. The patient knows to call the clinic with any problems, questions or concerns. No barriers to learning were detected. I spent *** counseling the patient face to face. The total time spent in the appointment was *** and more than 50% was on counseling, review of test results, and coordination of care.   Santiago Glad, NP-C @DATE @

## 2023-01-24 ENCOUNTER — Inpatient Hospital Stay: Payer: 59

## 2023-01-24 ENCOUNTER — Telehealth: Payer: Self-pay | Admitting: Hematology

## 2023-01-24 ENCOUNTER — Inpatient Hospital Stay: Payer: 59 | Admitting: Nurse Practitioner

## 2023-01-30 ENCOUNTER — Inpatient Hospital Stay: Payer: 59

## 2023-01-30 ENCOUNTER — Inpatient Hospital Stay: Payer: 59 | Attending: Hematology

## 2023-01-30 ENCOUNTER — Encounter: Payer: Self-pay | Admitting: Nurse Practitioner

## 2023-01-30 ENCOUNTER — Inpatient Hospital Stay (HOSPITAL_BASED_OUTPATIENT_CLINIC_OR_DEPARTMENT_OTHER): Payer: 59 | Admitting: Nurse Practitioner

## 2023-01-30 VITALS — BP 120/66 | HR 73 | Temp 98.4°F | Resp 16 | Wt 153.8 lb

## 2023-01-30 DIAGNOSIS — I129 Hypertensive chronic kidney disease with stage 1 through stage 4 chronic kidney disease, or unspecified chronic kidney disease: Secondary | ICD-10-CM | POA: Diagnosis not present

## 2023-01-30 DIAGNOSIS — F1210 Cannabis abuse, uncomplicated: Secondary | ICD-10-CM | POA: Insufficient documentation

## 2023-01-30 DIAGNOSIS — E1122 Type 2 diabetes mellitus with diabetic chronic kidney disease: Secondary | ICD-10-CM | POA: Insufficient documentation

## 2023-01-30 DIAGNOSIS — D500 Iron deficiency anemia secondary to blood loss (chronic): Secondary | ICD-10-CM | POA: Insufficient documentation

## 2023-01-30 DIAGNOSIS — Z79899 Other long term (current) drug therapy: Secondary | ICD-10-CM | POA: Diagnosis not present

## 2023-01-30 DIAGNOSIS — Z8719 Personal history of other diseases of the digestive system: Secondary | ICD-10-CM | POA: Insufficient documentation

## 2023-01-30 DIAGNOSIS — Z95828 Presence of other vascular implants and grafts: Secondary | ICD-10-CM

## 2023-01-30 DIAGNOSIS — N1832 Chronic kidney disease, stage 3b: Secondary | ICD-10-CM | POA: Insufficient documentation

## 2023-01-30 DIAGNOSIS — K922 Gastrointestinal hemorrhage, unspecified: Secondary | ICD-10-CM

## 2023-01-30 DIAGNOSIS — E878 Other disorders of electrolyte and fluid balance, not elsewhere classified: Secondary | ICD-10-CM | POA: Diagnosis not present

## 2023-01-30 DIAGNOSIS — F1410 Cocaine abuse, uncomplicated: Secondary | ICD-10-CM | POA: Insufficient documentation

## 2023-01-30 DIAGNOSIS — F1910 Other psychoactive substance abuse, uncomplicated: Secondary | ICD-10-CM

## 2023-01-30 DIAGNOSIS — Z90710 Acquired absence of both cervix and uterus: Secondary | ICD-10-CM | POA: Diagnosis not present

## 2023-01-30 DIAGNOSIS — Z8673 Personal history of transient ischemic attack (TIA), and cerebral infarction without residual deficits: Secondary | ICD-10-CM | POA: Insufficient documentation

## 2023-01-30 DIAGNOSIS — C19 Malignant neoplasm of rectosigmoid junction: Secondary | ICD-10-CM

## 2023-01-30 LAB — FERRITIN: Ferritin: 15 ng/mL (ref 11–307)

## 2023-01-30 LAB — CBC WITH DIFFERENTIAL (CANCER CENTER ONLY)
Abs Immature Granulocytes: 0.12 10*3/uL — ABNORMAL HIGH (ref 0.00–0.07)
Basophils Absolute: 0.1 10*3/uL (ref 0.0–0.1)
Basophils Relative: 1 %
Eosinophils Absolute: 0.2 10*3/uL (ref 0.0–0.5)
Eosinophils Relative: 2 %
HCT: 33.7 % — ABNORMAL LOW (ref 36.0–46.0)
Hemoglobin: 11.1 g/dL — ABNORMAL LOW (ref 12.0–15.0)
Immature Granulocytes: 2 %
Lymphocytes Relative: 20 %
Lymphs Abs: 1.5 10*3/uL (ref 0.7–4.0)
MCH: 30.6 pg (ref 26.0–34.0)
MCHC: 32.9 g/dL (ref 30.0–36.0)
MCV: 92.8 fL (ref 80.0–100.0)
Monocytes Absolute: 0.9 10*3/uL (ref 0.1–1.0)
Monocytes Relative: 13 %
Neutro Abs: 4.7 10*3/uL (ref 1.7–7.7)
Neutrophils Relative %: 62 %
Platelet Count: 241 10*3/uL (ref 150–400)
RBC: 3.63 MIL/uL — ABNORMAL LOW (ref 3.87–5.11)
RDW: 17.2 % — ABNORMAL HIGH (ref 11.5–15.5)
WBC Count: 7.5 10*3/uL (ref 4.0–10.5)
nRBC: 0 % (ref 0.0–0.2)

## 2023-01-30 LAB — CMP (CANCER CENTER ONLY)
ALT: 14 U/L (ref 0–44)
AST: 19 U/L (ref 15–41)
Albumin: 3.9 g/dL (ref 3.5–5.0)
Alkaline Phosphatase: 94 U/L (ref 38–126)
Anion gap: 7 (ref 5–15)
BUN: 34 mg/dL — ABNORMAL HIGH (ref 8–23)
CO2: 27 mmol/L (ref 22–32)
Calcium: 8.9 mg/dL (ref 8.9–10.3)
Chloride: 105 mmol/L (ref 98–111)
Creatinine: 1.97 mg/dL — ABNORMAL HIGH (ref 0.44–1.00)
GFR, Estimated: 27 mL/min — ABNORMAL LOW (ref >60)
Glucose, Bld: 114 mg/dL — ABNORMAL HIGH (ref 70–99)
Potassium: 5.1 mmol/L (ref 3.5–5.1)
Sodium: 139 mmol/L (ref 135–145)
Total Bilirubin: 0.2 mg/dL — ABNORMAL LOW (ref 0.3–1.2)
Total Protein: 6.7 g/dL (ref 6.5–8.1)

## 2023-01-30 MED ORDER — SODIUM CHLORIDE 0.9% FLUSH: 10.0000 mL | INTRAVENOUS | Status: DC | PRN

## 2023-01-30 MED ORDER — DIPHENHYDRAMINE HCL 25 MG PO CAPS
25.0000 mg | ORAL_CAPSULE | Freq: Once | ORAL | Status: AC
  Administered 2023-01-30: 25 mg via ORAL
  Filled 2023-01-30: qty 1

## 2023-01-30 MED ORDER — SODIUM CHLORIDE 0.9 % IV SOLN
510.0000 mg | Freq: Once | INTRAVENOUS | Status: AC
  Administered 2023-01-30: 510 mg via INTRAVENOUS
  Filled 2023-01-30: qty 510

## 2023-01-30 NOTE — Progress Notes (Signed)
Patient Care Team: Katheran James, DO as PCP - General Ernesto Rutherford, MD as Consulting Physician (Ophthalmology) Abigail Miyamoto, MD as Consulting Physician (General Surgery) Malachy Mood, MD as Consulting Physician (Hematology) Comer, Belia Heman, MD as Consulting Physician (Infectious Diseases) Danis, Andreas Blower, MD as Consulting Physician (Gastroenterology)   CHIEF COMPLAINT: Follow-up multifactorial anemia  CURRENT THERAPY:  IV Feraheme, last dose 10/12/22  Bevacizumab, last dose 09/28/22  INTERVAL HISTORY Mr. Guinane returns for follow-up, last seen by Dr. Mosetta Putt 10/26/2022, treatment was held due to no anemia. Labs in 12/06/22 again showed no anemia but ferritin had declined to 49.  Denies bruising or bleeding.  She has been feeling pretty well for the past several months, except left stump bothering her last 2 nights.  She has a PCP appointment in October.  Urine drug screen not done today but admits it will be positive for marijuana and cocaine, she would like to stop using but not able to do so on her own, open to help but does not want inpatient setting.   ROS  All other systems reviewed and negative   Past Medical History:  Diagnosis Date   Allergic rhinitis    Anemia, iron deficiency 05/03/2011   2/2 GI AVMs - recieves iron infusions as well as periodic red blood cell transfusions   Anxiety    Arteriovenous malformation of duodenum 02/25/2015   CARPAL TUNNEL RELEASE, HX OF 07/22/2008   Chest pain 10/28/2019   Colon polyps    Diabetes mellitus without complication (HCC)    type 2   Difficulty sleeping    takes trazadone for sleep   Diverticulosis    Electrolyte abnormality 01/25/2021   Fall 01/03/2020   GERD (gastroesophageal reflux disease)    GI bleed 07/07/2020   Hepatitis C    in SVR with Harvoni 12/2014-02/2015.    History of hernia repair 08/18/2012   History of porphyria 11/30/2014   Porphyria cutanea tarda in setting of chronic Hepatitis C, frequent  IV iron  infusions, and alcohol abuse    Hypertension    Hypocalcemia 10/28/2019   Hypocalcemia 01/19/2021   Hypokalemia 09/27/2014   Hypokalemia 01/19/2021   Hypomagnesemia 01/19/2021   Kidney disease 02/04/2019   Peripheral vascular disease (HCC)    Prolonged QT interval 01/19/2021   Stroke (HCC) 2010   no residual deficits   Substance use disorder    cocaine   Tachycardia 07/10/2017   Trichomonas vaginitis 04/16/2019     Past Surgical History:  Procedure Laterality Date   ABDOMINAL HYSTERECTOMY     APPLICATION OF WOUND VAC N/A 06/21/2014   Procedure: APPLICATION OF WOUND VAC;  Surgeon: Abigail Miyamoto, MD;  Location: [facility];  Service: General;  Laterality: N/A;   CARPAL TUNNEL RELEASE     rt hand   COLONOSCOPY WITH PROPOFOL N/A 03/17/2018   Procedure: COLONOSCOPY WITH PROPOFOL;  Surgeon: Sherrilyn Rist, MD;  Location: [facility];  Service: Gastroenterology;  Laterality: N/A;   ENTEROSCOPY N/A 12/04/2012   Procedure: ENTEROSCOPY;  Surgeon: Theda Belfast, MD;  Location: [facility];  Service: Endoscopy;  Laterality: N/A;   ENTEROSCOPY N/A 12/18/2012   Procedure: ENTEROSCOPY;  Surgeon: Theda Belfast, MD;  Location: [facility];  Service: Endoscopy;  Laterality: N/A;   ENTEROSCOPY N/A 02/25/2015   Procedure: ENTEROSCOPY;  Surgeon: Louis Meckel, MD;  Location: [facility];  Service: Endoscopy;  Laterality: N/A;   ENTEROSCOPY N/A 10/29/2017   Procedure: ENTEROSCOPY;  Surgeon: Sherrilyn Rist, MD;  [facility]: [facility];  Service: Gastroenterology;  Laterality: N/A;   ENTEROSCOPY N/A 07/08/2020   Procedure: ENTEROSCOPY;  Surgeon: Rachael Fee, MD;  Location: [facility];  Service: Endoscopy;  Laterality: N/A;   ESOPHAGOGASTRODUODENOSCOPY  05/05/2011   Procedure: ESOPHAGOGASTRODUODENOSCOPY (EGD);  Surgeon: Erick Blinks, MD;  Location: [facility];  Service: Gastroenterology;  Laterality: N/A;  Dr. Rhea Belton will do procedure for Dr. [name] Saturday.   ESOPHAGOGASTRODUODENOSCOPY  05/08/2011    Procedure: ESOPHAGOGASTRODUODENOSCOPY (EGD);  Surgeon: Theda Belfast;  [facility]: [facility];  Service: Endoscopy;  Laterality: N/A;   ESOPHAGOGASTRODUODENOSCOPY  06/07/2011   Procedure: ESOPHAGOGASTRODUODENOSCOPY (EGD);  Surgeon: Theda Belfast, MD;  Location: [facility];  Service: Endoscopy;  Laterality: N/A;   ESOPHAGOGASTRODUODENOSCOPY  12/20/2011   Procedure: ESOPHAGOGASTRODUODENOSCOPY (EGD);  Surgeon: Theda Belfast, MD;  Location: [facility];  Service: Endoscopy;  Laterality: N/A;   EYE SURGERY Bilateral    cataracts   FLEXIBLE SIGMOIDOSCOPY  12/21/2011   Procedure: FLEXIBLE SIGMOIDOSCOPY;  Surgeon: Theda Belfast, MD;  [facility]: [facility];  Service: Endoscopy;  Laterality: N/A;   HOT HEMOSTASIS  06/07/2011   Procedure: HOT HEMOSTASIS (ARGON PLASMA COAGULATION/BICAP);  Surgeon: Theda Belfast, MD;  Location: [facility];  Service: Endoscopy;  Laterality: N/A;   HOT HEMOSTASIS N/A 07/08/2020   Procedure: HOT HEMOSTASIS (ARGON PLASMA COAGULATION/BICAP);  Surgeon: Rachael Fee, MD;  Location: [facility];  Service: Endoscopy;  Laterality: N/A;   INCISIONAL HERNIA REPAIR N/A 06/01/2014   Procedure: ATTEMPTED LAPAROSCOPIC AND OPEN INCISIONAL HERNIA REPAIR WITH MESH;  Surgeon: Abigail Miyamoto, MD;  Location: [facility] OR;  Service: General;  Laterality: N/A;   INSERTION OF MESH N/A 06/01/2014   Procedure: INSERTION OF MESH;  Surgeon: Abigail Miyamoto, MD;  Location: [facility];  Service: General;  Laterality: N/A;   LAPAROTOMY  02/16/2012   Procedure: EXPLORATORY LAPAROTOMY;  Surgeon: Mariella Saa, MD;  Location: [facility];  Service: General;  Laterality: N/A;  oversewing of anastomotic leak and rigid sigmoidoscopy   LAPAROTOMY N/A 06/21/2014   Procedure: ABDOMINAL WOUND EXPLORATION;  Surgeon: Abigail Miyamoto, MD;  Location: [facility];  Service: General;  Laterality: N/A;   LEG AMPUTATION ABOVE KNEE     left   PARTIAL COLECTOMY  02/15/2012   Procedure: PARTIAL COLECTOMY;  Surgeon: Shelly Rubenstein, MD;  Location: [facility];  Service: General;  Laterality: N/A;   SUBMUCOSAL INJECTION  07/08/2020   Procedure: SUBMUCOSAL INJECTION;  Surgeon: Rachael Fee, MD;  Location: [facility];  Service: Endoscopy;;   TONSILLECTOMY     WRIST FUSION Right 04/27/2021   Procedure: SCAPHOID EXCISION  WITH 4 BONE FUSION RIGHT WRIST;  Surgeon: Betha Loa, MD;  Location: [facility];  Service: Orthopedics;  Laterality: Right;     Outpatient Encounter Medications as of 01/30/2023  Medication Sig   ACCU-CHEK FASTCLIX LANCETS MISC Use to test blood glucose 3 times daily.   allopurinol (ZYLOPRIM) 100 MG tablet Begin medication after one week of colchicine treatment. Take 1/2 tablet (50 mg total) by mouth daily.   amLODipine (NORVASC) 10 MG tablet Take 1 tablet (10 mg total) by mouth daily.   Blood Glucose Monitoring Suppl (ACCU-CHEK GUIDE) w/Device KIT 1 each by Does not apply route 3 (three) times daily. The patient is insulin requiring, ICD 10 code E11.65. The patient tests 3 times a day.   calcium-vitamin D (OSCAL WITH D) 500-200 MG-UNIT tablet Take 1 tablet by mouth 2 (two) times daily.   camphor-menthol (SARNA) lotion Apply 1 Application topically  as needed for itching.   colchicine 0.6 MG tablet Take 2 tablets of colchicine today and then one tablet daily.   dapagliflozin propanediol (FARXIGA) 10 MG TABS tablet Take 1 tablet by mouth daily.   Dulaglutide (TRULICITY) 0.75 MG/0.5ML SOPN Inject 0.75 mg into the skin once a week.   famotidine (PEPCID) 20 MG tablet Take 1 tablet (20 mg total) by mouth 2 (two) times daily.   folic acid (FOLVITE) 1 MG tablet Take 1 tablet (1 mg total) by mouth daily.   gabapentin (NEURONTIN) 400 MG capsule Take 1 capsule (400 mg total) by mouth in the morning AND 1 capsule (400 mg total) daily in the afternoon AND 2 capsules (800 mg total) before bedtime.   glucose blood (ACCU-CHEK GUIDE) test strip Use as instructed   losartan (COZAAR) 100 MG tablet Take 1  tablet (100 mg total) by mouth daily.   magnesium oxide (MAG-OX) 400 (240 Mg) MG tablet Take 1 tablet (400 mg total) by mouth daily.   metoprolol succinate (TOPROL XL) 50 MG 24 hr tablet Take 1 tablet (50 mg total) by mouth daily. Take with or immediately following a meal.   omeprazole (PRILOSEC) 20 MG capsule Take 1 capsule (20 mg total) by mouth daily.   rosuvastatin (CRESTOR) 20 MG tablet Take 1 tablet (20 mg total) by mouth daily.   No facility-administered encounter medications on file as of 01/30/2023.     Today's Vitals   01/30/23 0852 01/30/23 0855  BP: (!) 140/79 120/66  Pulse: 73   Resp: 16   Temp: 98.4 F (36.9 C)   TempSrc: Oral   SpO2: 99%   Weight: 153 lb 12.8 oz (69.8 kg)    Body mass index is 29.06 kg/m.   PHYSICAL EXAM GENERAL:alert, no distress and comfortable SKIN: no rash  EYES: sclera clear LUNGS: clear with normal breathing effort HEART: regular rate & rhythm NEURO: alert & oriented x 3 with fluent speech   CBC    Component Value Date/Time   WBC 7.5 01/30/2023 0826   WBC 8.7 11/21/2021 0940   RBC 3.63 (L) 01/30/2023 0826   HGB 11.1 (L) 01/30/2023 0826   HGB 8.0 (L) 07/10/2017 1639   HGB 11.3 (L) 01/31/2017 1416   HCT 33.7 (L) 01/30/2023 0826   HCT 33.8 (L) 08/10/2020 0753   HCT 33.9 (L) 01/31/2017 1416   PLT 241 01/30/2023 0826   PLT 450 (H) 07/10/2017 1639   MCV 92.8 01/30/2023 0826   MCV 91 07/10/2017 1639   MCV 97.3 01/31/2017 1416   MCH 30.6 01/30/2023 0826   MCHC 32.9 01/30/2023 0826   RDW 17.2 (H) 01/30/2023 0826   RDW 18.9 (H) 07/10/2017 1639   RDW 15.2 (H) 01/31/2017 1416   LYMPHSABS 1.5 01/30/2023 0826   LYMPHSABS 1.8 01/31/2017 1416   MONOABS 0.9 01/30/2023 0826   MONOABS 0.9 01/31/2017 1416   EOSABS 0.2 01/30/2023 0826   EOSABS 0.2 01/31/2017 1416   BASOSABS 0.1 01/30/2023 0826   BASOSABS 0.0 01/31/2017 1416     CMP     Component Value Date/Time   NA 139 01/30/2023 0826   NA 141 07/31/2022 0940   NA 141 01/31/2017  1416   K 5.1 01/30/2023 0826   K 4.4 01/31/2017 1416   CL 105 01/30/2023 0826   CL 104 10/03/2012 1048   CO2 27 01/30/2023 0826   CO2 22 01/31/2017 1416   GLUCOSE 114 (H) 01/30/2023 0826   GLUCOSE 104 01/31/2017 1416   GLUCOSE 154 (  H) 10/03/2012 1048   BUN 34 (H) 01/30/2023 0826   BUN 28 (H) 07/31/2022 0940   BUN 14.0 01/31/2017 1416   CREATININE 1.97 (H) 01/30/2023 0826   CREATININE 0.9 01/31/2017 1416   CALCIUM 8.9 01/30/2023 0826   CALCIUM 6.6 (LL) 01/19/2021 0906   CALCIUM 9.0 01/31/2017 1416   PROT 6.7 01/30/2023 0826   PROT 6.8 07/31/2022 0940   PROT 7.1 01/31/2017 1416   ALBUMIN 3.9 01/30/2023 0826   ALBUMIN 4.1 07/31/2022 0940   ALBUMIN 3.4 (L) 01/31/2017 1416   AST 19 01/30/2023 0826   AST 25 01/31/2017 1416   ALT 14 01/30/2023 0826   ALT 21 01/31/2017 1416   ALKPHOS 94 01/30/2023 0826   ALKPHOS 71 01/31/2017 1416   BILITOT 0.2 (L) 01/30/2023 0826   BILITOT 0.26 01/31/2017 1416   GFRNONAA 27 (L) 01/30/2023 0826   GFRNONAA >89 11/30/2014 1608   GFRAA 53 (L) 02/25/2020 1021   GFRAA >89 11/30/2014 1608     ASSESSMENT & PLAN:Lorilee D Sikkenga is a 72 y.o. female with       Iron deficiency anemia due to chronic blood loss secondary to GI blood loss, and Anemia of Chronic Disease -longstanding history of intermittent anemia dating back at least 2008 when Hg was 10. -h/o multiple GI bleeding in 2016, required blood transfusion, hospitalization and IV Feraheme -EGD and colonoscopy on 10/29/17 and 03/17/18 with Dr. Myrtie Neither, which were normal. Endoscopy in [facility] and was found to have AVM in small bowel. -Due to recurrent anemia requiring IV Feraheme and blood transfusions, she started bevacizumab for GI bleeding on 08/10/20. She responded very well with resolved anemia and less need for iv iron. This has been held intermittently due to elevated urine protein. Last dose given 09/2022 -Ms. Geeter appears stable, no bleeding or symptoms of anemia. -Anemia resolved after  last IV Feraheme and Beva in 09/2022, but has recurrent mild anemia today, hgb 11.1, and last ferritin 49. Today's level is pending -Will give IV Feraheme today and next week.  Will continue holding Bev a due to drug use, he understands we will not give if she is still using cocaine.  -Lab and follow-up in 3 months   Cocaine and marijuana abuse -She previously had a significant electrolyte imbalance, likely due to her substance abuse -Her urine continues to be positive for cocaine and marijuana -UDS not done today but she reports it would be positive, will continue to hold Beva.   -Ready to quit but needs help, but in outpatient setting . She agrees to social work referral I will ask Darl Pikes to call her -Emotional support provided    PLAN: Labs reviewed, ferritin is pending IV Feraheme today and next week Lab, f/up in 3 months with additional Feraheme and/or Beva as needed (Beva if UDS is negative) Referral to SW for assistance with outpatient drug rehab   Orders Placed This Encounter  Procedures   Total Protein, Urine dipstick    Standing Status:   Standing    Number of Occurrences:   1    Standing Expiration Date:   01/30/2024   Ambulatory referral to Social Work    Referral Priority:   Routine    Referral Type:   Consultation    Referral Reason:   Specialty Services Required    Referred to Provider:   Rachel Moulds, LCSW    Number of Visits Requested:   1      All questions were answered. The patient knows  to call the clinic with any problems, questions or concerns. No barriers to learning were detected. I spent 20 minutes counseling the patient face to face. The total time spent in the appointment was 30 minutes and more than 50% was on counseling, review of test results, and coordination of care.   Santiago Glad, NP-C 01/30/2023

## 2023-01-31 ENCOUNTER — Inpatient Hospital Stay: Payer: 59 | Admitting: Licensed Clinical Social Worker

## 2023-01-31 DIAGNOSIS — D500 Iron deficiency anemia secondary to blood loss (chronic): Secondary | ICD-10-CM

## 2023-01-31 NOTE — Progress Notes (Signed)
[facility] CSW Progress Note  Clinical Child psychotherapist contacted patient by phone to discuss substance abuse resources.  Pt states she would like to discuss resources; however, she presently has company and requested to call CSW back at another time.  CSW provided contact details.  CSW to remain available as appropriate to provide support.     Rachel Moulds, LCSW Clinical Social Worker [facility]    Patient is participating in a Managed Medicaid Plan:  Yes

## 2023-02-08 ENCOUNTER — Other Ambulatory Visit: Payer: Self-pay | Admitting: Student

## 2023-02-08 ENCOUNTER — Other Ambulatory Visit (HOSPITAL_COMMUNITY): Payer: Self-pay

## 2023-02-08 ENCOUNTER — Other Ambulatory Visit: Payer: Self-pay

## 2023-02-08 DIAGNOSIS — K219 Gastro-esophageal reflux disease without esophagitis: Secondary | ICD-10-CM

## 2023-02-08 MED ORDER — COLCHICINE 0.6 MG PO TABS
ORAL_TABLET | ORAL | 0 refills | Status: DC
  Filled 2023-02-08: qty 30, 29d supply, fill #0

## 2023-02-12 ENCOUNTER — Other Ambulatory Visit: Payer: Self-pay

## 2023-02-12 ENCOUNTER — Other Ambulatory Visit (HOSPITAL_COMMUNITY): Payer: Self-pay

## 2023-02-12 DIAGNOSIS — K219 Gastro-esophageal reflux disease without esophagitis: Secondary | ICD-10-CM

## 2023-02-13 ENCOUNTER — Other Ambulatory Visit (HOSPITAL_COMMUNITY): Payer: Self-pay

## 2023-02-13 MED ORDER — OMEPRAZOLE 20 MG PO CPDR
20.0000 mg | DELAYED_RELEASE_CAPSULE | Freq: Every day | ORAL | 2 refills | Status: DC
  Filled 2023-02-13: qty 30, 30d supply, fill #0
  Filled 2023-03-21: qty 30, 30d supply, fill #1
  Filled 2023-04-18: qty 30, 30d supply, fill #2

## 2023-02-18 ENCOUNTER — Other Ambulatory Visit (HOSPITAL_COMMUNITY): Payer: Self-pay

## 2023-02-19 ENCOUNTER — Other Ambulatory Visit (HOSPITAL_COMMUNITY): Payer: Self-pay

## 2023-03-05 ENCOUNTER — Other Ambulatory Visit: Payer: 59

## 2023-03-05 ENCOUNTER — Ambulatory Visit: Payer: 59 | Admitting: Nurse Practitioner

## 2023-03-08 ENCOUNTER — Telehealth: Payer: Self-pay | Admitting: Internal Medicine

## 2023-03-08 NOTE — Telephone Encounter (Signed)
This patient has screened positive for food insecurity in the past.   Today, patient was called to re-assess food insecurity needs.   Answers were updated in the SDOH wheel and resources were mailed or sent by mychart to the patient if requested.   Patricia Coder, MD

## 2023-03-12 ENCOUNTER — Other Ambulatory Visit: Payer: Self-pay

## 2023-03-12 ENCOUNTER — Other Ambulatory Visit (HOSPITAL_COMMUNITY): Payer: Self-pay

## 2023-03-12 ENCOUNTER — Other Ambulatory Visit: Payer: Self-pay | Admitting: Student

## 2023-03-12 DIAGNOSIS — I10 Essential (primary) hypertension: Secondary | ICD-10-CM

## 2023-03-12 MED ORDER — AMLODIPINE BESYLATE 10 MG PO TABS
10.0000 mg | ORAL_TABLET | Freq: Every day | ORAL | 1 refills | Status: DC
  Filled 2023-03-12 – 2023-03-13 (×2): qty 90, 90d supply, fill #0
  Filled 2023-08-07: qty 90, 90d supply, fill #1

## 2023-03-12 MED ORDER — COLCHICINE 0.6 MG PO TABS
ORAL_TABLET | ORAL | 0 refills | Status: DC
  Filled 2023-03-12 – 2023-03-13 (×2): qty 30, 29d supply, fill #0

## 2023-03-13 ENCOUNTER — Other Ambulatory Visit (HOSPITAL_COMMUNITY): Payer: Self-pay

## 2023-03-13 ENCOUNTER — Ambulatory Visit (INDEPENDENT_AMBULATORY_CARE_PROVIDER_SITE_OTHER): Payer: 59 | Admitting: Student

## 2023-03-13 ENCOUNTER — Other Ambulatory Visit: Payer: Self-pay

## 2023-03-13 ENCOUNTER — Encounter: Payer: Self-pay | Admitting: Student

## 2023-03-13 VITALS — BP 128/74 | HR 66 | Temp 97.6°F | Ht 61.0 in | Wt 153.5 lb

## 2023-03-13 DIAGNOSIS — I10 Essential (primary) hypertension: Secondary | ICD-10-CM | POA: Diagnosis not present

## 2023-03-13 DIAGNOSIS — Z794 Long term (current) use of insulin: Secondary | ICD-10-CM | POA: Diagnosis not present

## 2023-03-13 DIAGNOSIS — K922 Gastrointestinal hemorrhage, unspecified: Secondary | ICD-10-CM

## 2023-03-13 DIAGNOSIS — E119 Type 2 diabetes mellitus without complications: Secondary | ICD-10-CM

## 2023-03-13 DIAGNOSIS — Z7985 Long-term (current) use of injectable non-insulin antidiabetic drugs: Secondary | ICD-10-CM

## 2023-03-13 DIAGNOSIS — M1A9XX0 Chronic gout, unspecified, without tophus (tophi): Secondary | ICD-10-CM

## 2023-03-13 DIAGNOSIS — K31819 Angiodysplasia of stomach and duodenum without bleeding: Secondary | ICD-10-CM

## 2023-03-13 DIAGNOSIS — F1990 Other psychoactive substance use, unspecified, uncomplicated: Secondary | ICD-10-CM

## 2023-03-13 DIAGNOSIS — Z23 Encounter for immunization: Secondary | ICD-10-CM | POA: Diagnosis not present

## 2023-03-13 DIAGNOSIS — E1140 Type 2 diabetes mellitus with diabetic neuropathy, unspecified: Secondary | ICD-10-CM | POA: Diagnosis not present

## 2023-03-13 DIAGNOSIS — M109 Gout, unspecified: Secondary | ICD-10-CM

## 2023-03-13 DIAGNOSIS — Z7984 Long term (current) use of oral hypoglycemic drugs: Secondary | ICD-10-CM

## 2023-03-13 LAB — POCT GLYCOSYLATED HEMOGLOBIN (HGB A1C): Hemoglobin A1C: 5.2 % (ref 4.0–5.6)

## 2023-03-13 LAB — GLUCOSE, CAPILLARY: Glucose-Capillary: 84 mg/dL (ref 70–99)

## 2023-03-13 NOTE — Assessment & Plan Note (Signed)
At goal without symptoms side effects of medication. PLAN: continue losartan 100, Coreg 6.25 BID, amlodipine 10

## 2023-03-13 NOTE — Assessment & Plan Note (Signed)
A1C today improved to 5.2 from 5.8  3 months ago.  She is currently on Farxiga 10 mg and Trulicity 0.75. She sees an eye doctor.  PLAN:  -continue farxiga 10mg  every day  -Trulicity 0.75 weekly

## 2023-03-13 NOTE — Assessment & Plan Note (Addendum)
She has a 2-week history of dark black stools first thing in the morning.  Denies recurrent dark stools throughout the day, abd pain or bloating.  She has a history of AVM, and follows with oncologist Dr. Mosetta Putt.  Hb a month ago was 11.1. She has required IV iron and bevacizumab for the chronic blood loss due to AVM.   However due to patient's continuous use of cocaine, she would hold off on giving bevacizumab due to increased risk of bleeding.  Continues to use cocaine, most recent use was about 5 to 7 days ago.  Patient was educated about the fact that she will need rehabilitation to help with substance use.  She is adamant to that idea. She understands how the cocaine is actively affecting her health.  Will check the CBC, iron and ferritin, and if it is significantly low we will refer her to hematology/oncology.  -CBC -Iron and ferritin -Refer to hematology/oncology if hemoglobin is significantly reduced.

## 2023-03-13 NOTE — Patient Instructions (Signed)
Thank you, [name] for allowing [place] to provide your care today.   Today we discussed :  1.  For your hypertension, continue morphine 10 mg.  2. For your diabetes continue Farxiga 10 mg and Trulicity 0.75.  3.  For the black stools, will check the iron and hemoglobin count.  I will call you with the results.     I have ordered the following labs for you:   Lab Orders         Glucose, capillary         POC Hbg A1C         Follow up: 3 months     Should you have any questions or concerns please call the internal medicine clinic at [phone].    Laretta Bolster, MD  [facility]

## 2023-03-13 NOTE — Assessment & Plan Note (Signed)
Continues to use cocaine and marijuana.    -Referral to social worker placed.

## 2023-03-13 NOTE — Assessment & Plan Note (Signed)
>>  ASSESSMENT AND PLAN FOR TYPE 2 DIABETES MELLITUS WITH DIABETIC NEUROPATHY (HCC) WRITTEN ON 03/13/2023 12:34 PM BY KOOMSON, JULIUS, MD  A1C today improved to 5.2 from 5.8  3 months ago.  She is currently on Farxiga  10 mg and Trulicity  0.75. She sees an eye doctor.  PLAN:  -continue farxiga  10mg  every day  -Trulicity  0.75 weekly

## 2023-03-13 NOTE — Progress Notes (Signed)
CC: Type 2 diabetes, hypertension , dark black stools.  HPI:  [name] is a 72 y.o. female living with a history stated below and presents today for type 2 diabetes, hypertension and gout follow-up.Marland Kitchen Please see problem based assessment and plan for additional details.  Past Medical History:  Diagnosis Date   Allergic rhinitis    Anemia, iron deficiency 05/03/2011   2/2 GI AVMs - recieves iron infusions as well as periodic red blood cell transfusions   Anxiety    Arteriovenous malformation of duodenum 02/25/2015   CARPAL TUNNEL RELEASE, HX OF 07/22/2008   Chest pain 10/28/2019   Colon polyps    Diabetes mellitus without complication (HCC)    type 2   Difficulty sleeping    takes trazadone for sleep   Diverticulosis    Electrolyte abnormality 01/25/2021   Fall 01/03/2020   GERD (gastroesophageal reflux disease)    GI bleed 07/07/2020   Hepatitis C    in SVR with Harvoni 12/2014-02/2015.    History of hernia repair 08/18/2012   History of porphyria 11/30/2014   Porphyria cutanea tarda in setting of chronic Hepatitis C, frequent  IV iron infusions, and alcohol abuse    Hypertension    Hypocalcemia 10/28/2019   Hypocalcemia 01/19/2021   Hypokalemia 09/27/2014   Hypokalemia 01/19/2021   Hypomagnesemia 01/19/2021   Kidney disease 02/04/2019   Peripheral vascular disease (HCC)    Prolonged QT interval 01/19/2021   Stroke (HCC) 2010   no residual deficits   Substance use disorder    cocaine   Tachycardia 07/10/2017   Trichomonas vaginitis 04/16/2019    Current Outpatient Medications on File Prior to Visit  Medication Sig Dispense Refill   ACCU-CHEK FASTCLIX LANCETS MISC Use to test blood glucose 3 times daily. 100 each 11   allopurinol (ZYLOPRIM) 100 MG tablet Begin medication after one week of colchicine treatment. Take 1/2 tablet (50 mg total) by mouth daily. 30 tablet 2   amLODipine (NORVASC) 10 MG tablet Take 1 tablet (10 mg total) by mouth daily. 90 tablet 1   Blood Glucose  Monitoring Suppl (ACCU-CHEK GUIDE) w/Device KIT 1 each by Does not apply route 3 (three) times daily. The patient is insulin requiring, ICD 10 code E11.65. The patient tests 3 times a day. 1 kit 1   calcium-vitamin D (OSCAL WITH D) 500-200 MG-UNIT tablet Take 1 tablet by mouth 2 (two) times daily. 60 tablet 0   camphor-menthol (SARNA) lotion Apply 1 Application topically as needed for itching. 222 mL 1   colchicine 0.6 MG tablet Take 2 tablets (1.2 mg total) by mouth daily for 1 day, THEN 1 tablet (0.6 mg total) daily for 28 days. 30 tablet 0   dapagliflozin propanediol (FARXIGA) 10 MG TABS tablet Take 1 tablet by mouth daily. 30 tablet 12   Dulaglutide (TRULICITY) 0.75 MG/0.5ML SOPN Inject 0.75 mg into the skin once a week. 6 mL 3   famotidine (PEPCID) 20 MG tablet Take 1 tablet (20 mg total) by mouth 2 (two) times daily. 60 tablet 1   folic acid (FOLVITE) 1 MG tablet Take 1 tablet (1 mg total) by mouth daily. 30 tablet 2   gabapentin (NEURONTIN) 400 MG capsule Take 1 capsule (400 mg total) by mouth in the morning AND 1 capsule (400 mg total) daily in the afternoon AND 2 capsules (800 mg total) before bedtime. 360 capsule 3   glucose blood (ACCU-CHEK GUIDE) test strip Use as instructed 100 strip 3   losartan (  COZAAR) 100 MG tablet Take 1 tablet (100 mg total) by mouth daily. 90 tablet 1   magnesium oxide (MAG-OX) 400 (240 Mg) MG tablet Take 1 tablet (400 mg total) by mouth daily. 30 tablet 5   metoprolol succinate (TOPROL XL) 50 MG 24 hr tablet Take 1 tablet (50 mg total) by mouth daily. Take with or immediately following a meal. 30 tablet 11   omeprazole (PRILOSEC) 20 MG capsule Take 1 capsule (20 mg total) by mouth daily. 30 capsule 2   rosuvastatin (CRESTOR) 20 MG tablet Take 1 tablet (20 mg total) by mouth daily. 90 tablet 2   No current facility-administered medications on file prior to visit.     Review of Systems: ROS negative except for what is noted on the assessment and  plan.  Vitals:   03/13/23 0847  BP: 128/74  Pulse: 66  Temp: 97.6 F (36.4 C)  TempSrc: Oral  SpO2: 99%  Weight: 153 lb 8 oz (69.6 kg)  Height: 5\' 1"  (1.549 m)    Physical Exam: Constitutional: Well appearing, not in acute distress. Cardiovascular: regular rate and rhythm, no m/r/g Pulmonary/Chest: normal work of breathing on room air, lungs clear to auscultation bilaterally Abdominal: soft, non-tender, non-distended  Assessment & Plan:   Patient seen with Dr. Heide Spark  Angiodysplasia of duodenum She has a 2-week history of dark black stools first thing in the morning.  Denies recurrent dark stools throughout the day, abd pain or bloating.  She has a history of AVM, and follows with oncologist Dr. Mosetta Putt.  Hb a month ago was 11.1. She has required IV iron and bevacizumab for the chronic blood loss due to AVM.   However due to patient's continuous use of cocaine, she would hold off on giving bevacizumab due to increased risk of bleeding.  Continues to use cocaine, most recent use was about 5 to 7 days ago.  Patient was educated about the fact that she will need rehabilitation to help with substance use.  She is adamant to that idea. She understands how the cocaine is actively affecting her health.  Will check the CBC, iron and ferritin, and if it is significantly low we will refer her to hematology/oncology.  -CBC -Iron and ferritin -Refer to hematology/oncology if hemoglobin is significantly reduced.  Type 2 diabetes mellitus with diabetic neuropathy (HCC) A1C today improved to 5.2 from 5.8  3 months ago.  She is currently on Farxiga 10 mg and Trulicity 0.75. She sees an eye doctor.  PLAN:  -continue farxiga 10mg  every day  -Trulicity 0.75 weekly   Essential hypertension, benign  At goal without symptoms side effects of medication.  PLAN: continue losartan 100, Coreg 6.25 BID, amlodipine 10  Gout involving toe of right foot Stable. No acute flares.. Uric acid < 3.6.  She is  currently on colchicine 0.6 mg and a low-dose of allopurinol 50 mg, due to history of chronic kidney disease.  -Continue colchicine 0.6 mg -Continue allopurinol 50 mg 1 week after beginning colchicine  Substance use disorder Continues to use cocaine and marijuana.    -Referral to social worker placed.    Laretta Bolster, MD [facility] Internal [facility], PGY-1 Phone: [phone] Date 03/13/2023 Time 12:36 PM

## 2023-03-13 NOTE — Assessment & Plan Note (Signed)
Stable. No acute flares.. Uric acid < 3.6.  She is currently on colchicine 0.6 mg and a low-dose of allopurinol 50 mg, due to history of chronic kidney disease.  -Continue colchicine 0.6 mg -Continue allopurinol 50 mg 1 week after beginning colchicine

## 2023-03-14 LAB — CBC WITH DIFFERENTIAL/PLATELET
Basophils Absolute: 0.1 10*3/uL (ref 0.0–0.2)
Basos: 1 %
EOS (ABSOLUTE): 0.2 10*3/uL (ref 0.0–0.4)
Eos: 2 %
Hematocrit: 35.3 % (ref 34.0–46.6)
Hemoglobin: 11.3 g/dL (ref 11.1–15.9)
Immature Grans (Abs): 0.1 10*3/uL (ref 0.0–0.1)
Immature Granulocytes: 1 %
Lymphocytes Absolute: 0.9 10*3/uL (ref 0.7–3.1)
Lymphs: 13 %
MCH: 31.5 pg (ref 26.6–33.0)
MCHC: 32 g/dL (ref 31.5–35.7)
MCV: 98 fL — ABNORMAL HIGH (ref 79–97)
Monocytes Absolute: 0.9 10*3/uL (ref 0.1–0.9)
Monocytes: 13 %
Neutrophils Absolute: 4.9 10*3/uL (ref 1.4–7.0)
Neutrophils: 70 %
Platelets: 244 10*3/uL (ref 150–450)
RBC: 3.59 x10E6/uL — ABNORMAL LOW (ref 3.77–5.28)
RDW: 13.7 % (ref 11.7–15.4)
WBC: 7.1 10*3/uL (ref 3.4–10.8)

## 2023-03-14 LAB — IRON,TIBC AND FERRITIN PANEL
Ferritin: 59 ng/mL (ref 15–150)
Iron Saturation: 23 % (ref 15–55)
Iron: 70 ug/dL (ref 27–139)
Total Iron Binding Capacity: 302 ug/dL (ref 250–450)
UIBC: 232 ug/dL (ref 118–369)

## 2023-03-15 NOTE — Progress Notes (Signed)
Internal Medicine Clinic Attending  I was physically present during the key portions of the resident provided service and participated in the medical decision making of patient's management care. I reviewed pertinent patient test results.  The assessment, diagnosis, and plan were formulated together and I agree with the documentation in the resident's note.  Patient with recurrence of once daily melena, likely some oozing from her known issue with intestinal telengectasia. I am reassured that there is no brisk bleeding. Her blood counts and iron levels are stable, which is also reassuring. I asked her to consult with Dr. Mosetta Putt sooner than her usual follow up in December to talk about restarting Bevacizumab which was helpful in the past for treating the AVMs. Consultation with her GI doctor soon is also reasonable. We talked about the cocaine use, unfortunately there is no medication assisted treatment for this use disorder, we will help her find counseling which may offer some benefit.   Erlinda Hong, MD FACP

## 2023-03-21 ENCOUNTER — Other Ambulatory Visit (HOSPITAL_COMMUNITY): Payer: Self-pay

## 2023-03-26 ENCOUNTER — Telehealth: Payer: Self-pay

## 2023-03-26 NOTE — Telephone Encounter (Signed)
error 

## 2023-04-01 ENCOUNTER — Other Ambulatory Visit: Payer: Self-pay | Admitting: Student

## 2023-04-01 DIAGNOSIS — Z1231 Encounter for screening mammogram for malignant neoplasm of breast: Secondary | ICD-10-CM

## 2023-04-17 ENCOUNTER — Encounter: Payer: Self-pay | Admitting: Hematology

## 2023-04-17 NOTE — Telephone Encounter (Signed)
Telephone call  

## 2023-04-18 ENCOUNTER — Other Ambulatory Visit (HOSPITAL_COMMUNITY): Payer: Self-pay

## 2023-04-19 ENCOUNTER — Other Ambulatory Visit (HOSPITAL_COMMUNITY): Payer: Self-pay

## 2023-04-19 ENCOUNTER — Other Ambulatory Visit (HOSPITAL_BASED_OUTPATIENT_CLINIC_OR_DEPARTMENT_OTHER): Payer: Self-pay

## 2023-05-01 ENCOUNTER — Inpatient Hospital Stay (HOSPITAL_BASED_OUTPATIENT_CLINIC_OR_DEPARTMENT_OTHER): Payer: 59 | Admitting: Hematology

## 2023-05-01 ENCOUNTER — Encounter: Payer: Self-pay | Admitting: Hematology

## 2023-05-01 ENCOUNTER — Inpatient Hospital Stay: Payer: 59 | Attending: Hematology

## 2023-05-01 ENCOUNTER — Inpatient Hospital Stay: Payer: 59

## 2023-05-01 VITALS — BP 135/81 | HR 85 | Temp 97.7°F | Resp 18 | Wt 156.5 lb

## 2023-05-01 DIAGNOSIS — C19 Malignant neoplasm of rectosigmoid junction: Secondary | ICD-10-CM

## 2023-05-01 DIAGNOSIS — K922 Gastrointestinal hemorrhage, unspecified: Secondary | ICD-10-CM | POA: Diagnosis not present

## 2023-05-01 DIAGNOSIS — J069 Acute upper respiratory infection, unspecified: Secondary | ICD-10-CM | POA: Insufficient documentation

## 2023-05-01 DIAGNOSIS — F1491 Cocaine use, unspecified, in remission: Secondary | ICD-10-CM | POA: Insufficient documentation

## 2023-05-01 DIAGNOSIS — D500 Iron deficiency anemia secondary to blood loss (chronic): Secondary | ICD-10-CM | POA: Diagnosis present

## 2023-05-01 DIAGNOSIS — Z79899 Other long term (current) drug therapy: Secondary | ICD-10-CM | POA: Diagnosis not present

## 2023-05-01 LAB — CBC WITH DIFFERENTIAL (CANCER CENTER ONLY)
Abs Immature Granulocytes: 0.11 10*3/uL — ABNORMAL HIGH (ref 0.00–0.07)
Basophils Absolute: 0 10*3/uL (ref 0.0–0.1)
Basophils Relative: 1 %
Eosinophils Absolute: 0.1 10*3/uL (ref 0.0–0.5)
Eosinophils Relative: 2 %
HCT: 37.8 % (ref 36.0–46.0)
Hemoglobin: 11.9 g/dL — ABNORMAL LOW (ref 12.0–15.0)
Immature Granulocytes: 2 %
Lymphocytes Relative: 10 %
Lymphs Abs: 0.7 10*3/uL (ref 0.7–4.0)
MCH: 29.7 pg (ref 26.0–34.0)
MCHC: 31.5 g/dL (ref 30.0–36.0)
MCV: 94.3 fL (ref 80.0–100.0)
Monocytes Absolute: 0.8 10*3/uL (ref 0.1–1.0)
Monocytes Relative: 11 %
Neutro Abs: 5.3 10*3/uL (ref 1.7–7.7)
Neutrophils Relative %: 74 %
Platelet Count: 305 10*3/uL (ref 150–400)
RBC: 4.01 MIL/uL (ref 3.87–5.11)
RDW: 13.3 % (ref 11.5–15.5)
WBC Count: 7.1 10*3/uL (ref 4.0–10.5)
nRBC: 0 % (ref 0.0–0.2)

## 2023-05-01 LAB — CMP (CANCER CENTER ONLY)
ALT: 7 U/L (ref 0–44)
AST: 15 U/L (ref 15–41)
Albumin: 4 g/dL (ref 3.5–5.0)
Alkaline Phosphatase: 99 U/L (ref 38–126)
Anion gap: 7 (ref 5–15)
BUN: 17 mg/dL (ref 8–23)
CO2: 26 mmol/L (ref 22–32)
Calcium: 8.8 mg/dL — ABNORMAL LOW (ref 8.9–10.3)
Chloride: 106 mmol/L (ref 98–111)
Creatinine: 1.44 mg/dL — ABNORMAL HIGH (ref 0.44–1.00)
GFR, Estimated: 39 mL/min — ABNORMAL LOW (ref >60)
Glucose, Bld: 94 mg/dL (ref 70–99)
Potassium: 3.8 mmol/L (ref 3.5–5.1)
Sodium: 139 mmol/L (ref 135–145)
Total Bilirubin: 0.3 mg/dL (ref <1.2)
Total Protein: 7.2 g/dL (ref 6.5–8.1)

## 2023-05-01 LAB — RAPID URINE DRUG SCREEN, HOSP PERFORMED
Amphetamines: NOT DETECTED
Barbiturates: NOT DETECTED
Benzodiazepines: NOT DETECTED
Cocaine: POSITIVE — AB
Opiates: NOT DETECTED
Tetrahydrocannabinol: POSITIVE — AB

## 2023-05-01 LAB — TOTAL PROTEIN, URINE DIPSTICK: Protein, ur: 100 mg/dL — AB

## 2023-05-01 NOTE — Progress Notes (Signed)
[facility]   [phone] [phone]   Clinic Follow up Note   Patient Care Team: Katheran James, DO as PCP - General Ernesto Rutherford, MD as Consulting Physician (Ophthalmology) Abigail Miyamoto, MD as Consulting Physician (General Surgery) Malachy Mood, MD as Consulting Physician (Hematology) Comer, Belia Heman, MD as Consulting Physician (Infectious Diseases) Sherrilyn Rist, MD as Consulting Physician (Gastroenterology)  Date of Service:  05/01/2023  CHIEF COMPLAINT: f/u of anemia  CURRENT THERAPY:  IV iron as needed Bevacizumab as needed  Oncology History   Iron deficiency anemia due to chronic blood loss secondary to GI blood loss, and Anemia of Chronic Disease -longstanding history of intermittent anemia dating back at least 2008 when Hg was 10. -h/o multiple GI bleeding in 2016, required blood transfusion, hospitalization and IV Feraheme -EGD and colonoscopy on 10/29/17 and 03/17/18 with Dr. Myrtie Neither, which were normal. Endoscopy in [facility] and was found to have AVM in small bowel. -Due to recurrent anemia requiring IV Feraheme and blood transfusions, she started bevacizumab for GI bleeding on 08/10/20. She responded very well with resolved anemia and less need for iv iron. This has been held intermittently due to elevated urine protein. Last dose given 04/03/22. -She is clinically doing well, no melena or other GI bleeding lately.  Lab reviewed, hemoglobin 11.9, iron level still pending. -Will continue IV iron and bevacizumab as needed -She has cut down her marijuana and cocaine use, I again encouraged her to completely stop.    Assessment and Plan    Anemia Hemoglobin is 11.9, well-managed. No significant bleeding reported. No need for blood transfusion or iron infusion. Emphasized monitoring for new symptoms and maintaining regular follow-ups. - Order iron levels at next visit - Schedule monthly lab appointments - Follow-up in three  months  Substance Use (Cocaine) Admits to occasional cocaine use. Discussed the necessity of abstinence from cocaine to receive bevacizumab. Expressed desire to quit and acknowledged the importance of self-care. Provided support and resources for cessation. - Encourage cessation of cocaine use - Provide support and resources for substance use cessation  Upper Respiratory Infection Symptoms include coughing, scratchy throat, and rhinorrhea for two weeks. No fever. Symptoms improving with self-care. Discussed monitoring for worsening symptoms and considering COVID-19 testing if symptoms persist or worsen. - Advise to continue symptomatic treatment and monitor for any worsening symptoms - Consider COVID-19 testing if symptoms persist or worsen  Prosthetic Limb Management Adapting well to prosthetic leg, wearing it for up to ten hours daily. No significant issues reported. Active and mobile. Emphasized regular activity to prevent stump stiffness. - Continue current prosthetic use regimen - Encourage regular activity to prevent stump stiffness  General Health Maintenance Generally in good health. Emphasized the importance of regular follow-ups and monitoring for new symptoms. - Schedule monthly lab appointments - Follow-up in three months.      Plan -Lab reviewed, no need to blood transfusion, IV iron or bevacizumab -Lab monthly with follow-up in 3 months   Discussed the use of AI scribe software for clinical note transcription with the patient, who gave verbal consent to proceed.  History of Present Illness   The patient, a 72 year old female with a history of anemia, presents for a routine follow-up. She reports a recent two-week period of feeling unwell, characterized by a persistent cough, scratchy throat, and runny nose. She also experienced a loss of appetite, which is now improving, and intermittent episodes of feeling hot and cold, akin to chills. She denies  having a fever. She  managed her symptoms at home with gargling and did not seek additional medical attention. She has not been tested for COVID-19 but plans to get vaccinated at a local drugstore.  The patient's energy level is reported as good, and she has been adjusting well to her new prosthetic leg, wearing it for up to ten hours a day. She reports feeling 'lost' without it. She also reports occasional light bleeding, which she has noticed about three times in the past month. She is currently taking prescribed medications, including vitamin D and folic acid. She admits to occasional alcohol consumption and cocaine use.  The patient also mentions a recent improvement in her mobility, with increased use of her prosthetic leg. She expresses a desire to stop using cocaine and take better care of her health.         All other systems were reviewed with the patient and are negative.  MEDICAL HISTORY:  Past Medical History:  Diagnosis Date   Allergic rhinitis    Anemia, iron deficiency 05/03/2011   2/2 GI AVMs - recieves iron infusions as well as periodic red blood cell transfusions   Anxiety    Arteriovenous malformation of duodenum 02/25/2015   CARPAL TUNNEL RELEASE, HX OF 07/22/2008   Chest pain 10/28/2019   Colon polyps    Diabetes mellitus without complication (HCC)    type 2   Difficulty sleeping    takes trazadone for sleep   Diverticulosis    Electrolyte abnormality 01/25/2021   Fall 01/03/2020   GERD (gastroesophageal reflux disease)    GI bleed 07/07/2020   Hepatitis C    in SVR with Harvoni 12/2014-02/2015.    History of hernia repair 08/18/2012   History of porphyria 11/30/2014   Porphyria cutanea tarda in setting of chronic Hepatitis C, frequent  IV iron infusions, and alcohol abuse    Hypertension    Hypocalcemia 10/28/2019   Hypocalcemia 01/19/2021   Hypokalemia 09/27/2014   Hypokalemia 01/19/2021   Hypomagnesemia 01/19/2021   Kidney disease 02/04/2019   Peripheral vascular disease (HCC)    Prolonged  QT interval 01/19/2021   Stroke (HCC) 2010   no residual deficits   Substance use disorder    cocaine   Tachycardia 07/10/2017   Trichomonas vaginitis 04/16/2019    SURGICAL HISTORY: Past Surgical History:  Procedure Laterality Date   ABDOMINAL HYSTERECTOMY     APPLICATION OF WOUND VAC N/A 06/21/2014   Procedure: APPLICATION OF WOUND VAC;  Surgeon: Abigail Miyamoto, MD;  Location: [facility] OR;  Service: General;  Laterality: N/A;   CARPAL TUNNEL RELEASE     rt hand   COLONOSCOPY WITH PROPOFOL N/A 03/17/2018   Procedure: COLONOSCOPY WITH PROPOFOL;  Surgeon: Sherrilyn Rist, MD;  Location: [facility];  Service: Gastroenterology;  Laterality: N/A;   ENTEROSCOPY N/A 12/04/2012   Procedure: ENTEROSCOPY;  Surgeon: Theda Belfast, MD;  Location: [facility];  Service: Endoscopy;  Laterality: N/A;   ENTEROSCOPY N/A 12/18/2012   Procedure: ENTEROSCOPY;  Surgeon: Theda Belfast, MD;  Location: [facility];  Service: Endoscopy;  Laterality: N/A;   ENTEROSCOPY N/A 02/25/2015   Procedure: ENTEROSCOPY;  Surgeon: Louis Meckel, MD;  Location: [facility] ENDOSCOPY;  Service: Endoscopy;  Laterality: N/A;   ENTEROSCOPY N/A 10/29/2017   Procedure: ENTEROSCOPY;  Surgeon: Sherrilyn Rist, MD;  Location: [facility];  Service: Gastroenterology;  Laterality: N/A;   ENTEROSCOPY N/A 07/08/2020   Procedure: ENTEROSCOPY;  Surgeon: Rachael Fee, MD;  Location: [facility]  ENDOSCOPY;  Service: Endoscopy;  Laterality: N/A;   ESOPHAGOGASTRODUODENOSCOPY  05/05/2011   Procedure: ESOPHAGOGASTRODUODENOSCOPY (EGD);  Surgeon: Erick Blinks, MD;  Location: [facility] ENDOSCOPY;  Service: Gastroenterology;  Laterality: N/A;  Dr. Rhea Belton will do procedure for Dr. [name] Saturday.   ESOPHAGOGASTRODUODENOSCOPY  05/08/2011   Procedure: ESOPHAGOGASTRODUODENOSCOPY (EGD);  Surgeon: Theda Belfast;  Location: [facility];  Service: Endoscopy;  Laterality: N/A;   ESOPHAGOGASTRODUODENOSCOPY  06/07/2011   Procedure: ESOPHAGOGASTRODUODENOSCOPY (EGD);  Surgeon:  Theda Belfast, MD;  Location: [facility] ENDOSCOPY;  Service: Endoscopy;  Laterality: N/A;   ESOPHAGOGASTRODUODENOSCOPY  12/20/2011   Procedure: ESOPHAGOGASTRODUODENOSCOPY (EGD);  Surgeon: Theda Belfast, MD;  Location: [facility];  Service: Endoscopy;  Laterality: N/A;   EYE SURGERY Bilateral    cataracts   FLEXIBLE SIGMOIDOSCOPY  12/21/2011   Procedure: FLEXIBLE SIGMOIDOSCOPY;  Surgeon: Theda Belfast, MD;  Location: [facility];  Service: Endoscopy;  Laterality: N/A;   HOT HEMOSTASIS  06/07/2011   Procedure: HOT HEMOSTASIS (ARGON PLASMA COAGULATION/BICAP);  Surgeon: Theda Belfast, MD;  Location: [facility];  Service: Endoscopy;  Laterality: N/A;   HOT HEMOSTASIS N/A 07/08/2020   Procedure: HOT HEMOSTASIS (ARGON PLASMA COAGULATION/BICAP);  Surgeon: Rachael Fee, MD;  Location: [facility];  Service: Endoscopy;  Laterality: N/A;   INCISIONAL HERNIA REPAIR N/A 06/01/2014   Procedure: ATTEMPTED LAPAROSCOPIC AND OPEN INCISIONAL HERNIA REPAIR WITH MESH;  Surgeon: Abigail Miyamoto, MD;  Location: [facility] OR;  Service: General;  Laterality: N/A;   INSERTION OF MESH N/A 06/01/2014   Procedure: INSERTION OF MESH;  Surgeon: Abigail Miyamoto, MD;  Location: [facility] OR;  Service: General;  Laterality: N/A;   LAPAROTOMY  02/16/2012   Procedure: EXPLORATORY LAPAROTOMY;  Surgeon: Mariella Saa, MD;  Location: [facility] ORS;  Service: General;  Laterality: N/A;  oversewing of anastomotic leak and rigid sigmoidoscopy   LAPAROTOMY N/A 06/21/2014   Procedure: ABDOMINAL WOUND EXPLORATION;  Surgeon: Abigail Miyamoto, MD;  Location: [facility] OR;  Service: General;  Laterality: N/A;   LEG AMPUTATION ABOVE KNEE     left   PARTIAL COLECTOMY  02/15/2012   Procedure: PARTIAL COLECTOMY;  Surgeon: Shelly Rubenstein, MD;  Location: [facility];  Service: General;  Laterality: N/A;   SUBMUCOSAL INJECTION  07/08/2020   Procedure: SUBMUCOSAL INJECTION;  Surgeon: Rachael Fee, MD;  Location: [facility];  Service: Endoscopy;;   TONSILLECTOMY      WRIST FUSION Right 04/27/2021   Procedure: SCAPHOID EXCISION  WITH 4 BONE FUSION RIGHT WRIST;  Surgeon: Betha Loa, MD;  Location: [facility];  Service: Orthopedics;  Laterality: Right;    I have reviewed the social history and family history with the patient and they are unchanged from previous note.  ALLERGIES:  is allergic to ciprofloxacin, morphine and codeine, penicillins, codeine, lisinopril, and feraheme [ferumoxytol].  MEDICATIONS:  Current Outpatient Medications  Medication Sig Dispense Refill   ACCU-CHEK FASTCLIX LANCETS MISC Use to test blood glucose 3 times daily. 100 each 11   allopurinol (ZYLOPRIM) 100 MG tablet Begin medication after one week of colchicine treatment. Take 1/2 tablet (50 mg total) by mouth daily. 30 tablet 2   amLODipine (NORVASC) 10 MG tablet Take 1 tablet (10 mg total) by mouth daily. 90 tablet 1   Blood Glucose Monitoring Suppl (ACCU-CHEK GUIDE) w/Device KIT 1 each by Does not apply route 3 (three) times daily. The patient is insulin requiring, ICD 10 code E11.65. The patient tests 3 times a day. 1 kit 1   calcium-vitamin D (OSCAL WITH D)  500-200 MG-UNIT tablet Take 1 tablet by mouth 2 (two) times daily. 60 tablet 0   camphor-menthol (SARNA) lotion Apply 1 Application topically as needed for itching. 222 mL 1   colchicine 0.6 MG tablet Take 2 tablets (1.2 mg total) by mouth daily for 1 day, THEN 1 tablet (0.6 mg total) daily for 28 days. 30 tablet 0   dapagliflozin propanediol (FARXIGA) 10 MG TABS tablet Take 1 tablet by mouth daily. 30 tablet 12   Dulaglutide (TRULICITY) 0.75 MG/0.5ML SOPN Inject 0.75 mg into the skin once a week. 6 mL 3   famotidine (PEPCID) 20 MG tablet Take 1 tablet (20 mg total) by mouth 2 (two) times daily. 60 tablet 1   folic acid (FOLVITE) 1 MG tablet Take 1 tablet (1 mg total) by mouth daily. 30 tablet 2   gabapentin (NEURONTIN) 400 MG capsule Take 1 capsule (400 mg total) by mouth in the morning AND 1 capsule (400 mg  total) daily in the afternoon AND 2 capsules (800 mg total) before bedtime. 360 capsule 3   glucose blood (ACCU-CHEK GUIDE) test strip Use as instructed 100 strip 3   losartan (COZAAR) 100 MG tablet Take 1 tablet (100 mg total) by mouth daily. 90 tablet 1   magnesium oxide (MAG-OX) 400 (240 Mg) MG tablet Take 1 tablet (400 mg total) by mouth daily. 30 tablet 5   metoprolol succinate (TOPROL XL) 50 MG 24 hr tablet Take 1 tablet (50 mg total) by mouth daily. Take with or immediately following a meal. 30 tablet 11   omeprazole (PRILOSEC) 20 MG capsule Take 1 capsule (20 mg total) by mouth daily. 30 capsule 2   rosuvastatin (CRESTOR) 20 MG tablet Take 1 tablet (20 mg total) by mouth daily. 90 tablet 2   No current facility-administered medications for this visit.    PHYSICAL EXAMINATION: ECOG PERFORMANCE STATUS: 2 - Symptomatic, <50% confined to bed  Vitals:   05/01/23 0931  BP: 135/81  Pulse: 85  Resp: 18  Temp: 97.7 F (36.5 C)  SpO2: 100%   Wt Readings from Last 3 Encounters:  05/01/23 156 lb 8 oz (71 kg)  03/13/23 153 lb 8 oz (69.6 kg)  01/30/23 153 lb 12.8 oz (69.8 kg)     GENERAL:alert, no distress and comfortable SKIN: skin color, texture, turgor are normal, no rashes or significant lesions EYES: normal, Conjunctiva are pink and non-injected, sclera clear NECK: supple, thyroid normal size, non-tender, without nodularity LYMPH:  no palpable lymphadenopathy in the cervical, axillary  LUNGS: clear to auscultation and percussion with normal breathing effort HEART: regular rate & rhythm and no murmurs and no lower extremity edema ABDOMEN:abdomen soft, non-tender and normal bowel sounds Musculoskeletal:no cyanosis of digits and no clubbing  NEURO: alert & oriented x 3 with fluent speech, no focal motor/sensory deficits  LABORATORY DATA:  I have reviewed the data as listed    Latest Ref Rng & Units 05/01/2023    9:01 AM 03/13/2023    9:58 AM 01/30/2023    8:26 AM  CBC  WBC  4.0 - 10.5 K/uL 7.1  7.1  7.5   Hemoglobin 12.0 - 15.0 g/dL 02.7  25.3  66.4   Hematocrit 36.0 - 46.0 % 37.8  35.3  33.7   Platelets 150 - 400 K/uL 305  244  241         Latest Ref Rng & Units 01/30/2023    8:26 AM 12/06/2022    7:31 AM 11/12/2022    7:31  AM  CMP  Glucose 70 - 99 mg/dL 130  81  83   BUN 8 - 23 mg/dL 34  19  34   Creatinine 0.44 - 1.00 mg/dL 8.65  7.84  6.96   Sodium 135 - 145 mmol/L 139  141  138   Potassium 3.5 - 5.1 mmol/L 5.1  4.2  4.8   Chloride 98 - 111 mmol/L 105  107  108   CO2 22 - 32 mmol/L 27  26  23    Calcium 8.9 - 10.3 mg/dL 8.9  9.3  8.6   Total Protein 6.5 - 8.1 g/dL 6.7  7.1  6.4   Total Bilirubin 0.3 - 1.2 mg/dL 0.2  0.3  0.2   Alkaline Phos 38 - 126 U/L 94  89  91   AST 15 - 41 U/L 19  14  22    ALT 0 - 44 U/L 14  11  15        RADIOGRAPHIC STUDIES: I have personally reviewed the radiological images as listed and agreed with the findings in the report. No results found.    Orders Placed This Encounter  Procedures   Ferritin    Standing Status:   Standing    Number of Occurrences:   40    Standing Expiration Date:   04/30/2024   CMP ([facility]cer Center only)    Standing Status:   Standing    Number of Occurrences:   50    Standing Expiration Date:   04/30/2024   All questions were answered. The patient knows to call the clinic with any problems, questions or concerns. No barriers to learning was detected. The total time spent in the appointment was 20 minutes.     Malachy Mood, MD 05/01/2023

## 2023-05-01 NOTE — Assessment & Plan Note (Signed)
secondary to GI blood loss, and Anemia of Chronic Disease -longstanding history of intermittent anemia dating back at least 2008 when Hg was 10. -h/o multiple GI bleeding in 2016, required blood transfusion, hospitalization and IV Feraheme -EGD and colonoscopy on 10/29/17 and 03/17/18 with Dr. Myrtie Neither, which were normal. 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 -Due to recurrent anemia requiring IV Feraheme and blood transfusions, she started bevacizumab for GI bleeding on 08/10/20. She responded very well with resolved anemia and less need for iv iron. This has been held intermittently due to elevated urine protein. Last dose given 04/03/22. -She is clinically doing well, no melena or other GI bleeding lately.  Lab reviewed, hemoglobin 11.9, iron level still pending. -Will continue IV iron and bevacizumab as needed -She has cut down her marijuana and cocaine use, I again encouraged her to completely stop.

## 2023-05-03 ENCOUNTER — Ambulatory Visit
Admission: RE | Admit: 2023-05-03 | Discharge: 2023-05-03 | Disposition: A | Discharge status: Home / Self Care | Payer: 59 | Source: Ambulatory Visit | Attending: Obstetrics and Gynecology | Admitting: Obstetrics and Gynecology

## 2023-05-03 DIAGNOSIS — Z1231 Encounter for screening mammogram for malignant neoplasm of breast: Secondary | ICD-10-CM

## 2023-05-20 ENCOUNTER — Other Ambulatory Visit: Payer: Self-pay

## 2023-05-20 ENCOUNTER — Other Ambulatory Visit (HOSPITAL_COMMUNITY): Payer: Self-pay

## 2023-05-20 ENCOUNTER — Other Ambulatory Visit: Payer: Self-pay | Admitting: Student

## 2023-05-20 ENCOUNTER — Telehealth: Payer: Self-pay

## 2023-05-20 ENCOUNTER — Other Ambulatory Visit: Payer: Self-pay | Admitting: Internal Medicine

## 2023-05-20 DIAGNOSIS — E782 Mixed hyperlipidemia: Secondary | ICD-10-CM

## 2023-05-20 DIAGNOSIS — K219 Gastro-esophageal reflux disease without esophagitis: Secondary | ICD-10-CM

## 2023-05-20 MED ORDER — ROSUVASTATIN CALCIUM 20 MG PO TABS
20.0000 mg | ORAL_TABLET | Freq: Every day | ORAL | 2 refills | Status: DC
  Filled 2023-05-20: qty 90, 90d supply, fill #0

## 2023-05-20 MED ORDER — GABAPENTIN 400 MG PO CAPS
ORAL_CAPSULE | ORAL | 3 refills | Status: DC
  Filled 2023-05-20: qty 360, 90d supply, fill #0
  Filled 2023-08-21: qty 360, 90d supply, fill #1
  Filled 2023-09-12 – 2023-11-12 (×2): qty 360, 90d supply, fill #0
  Filled 2024-02-10: qty 360, 90d supply, fill #1

## 2023-05-20 MED ORDER — OMEPRAZOLE 20 MG PO CPDR
20.0000 mg | DELAYED_RELEASE_CAPSULE | Freq: Every day | ORAL | 2 refills | Status: DC
  Filled 2023-05-20: qty 30, 30d supply, fill #0
  Filled 2023-06-24: qty 30, 30d supply, fill #1
  Filled 2023-07-26: qty 30, 30d supply, fill #2

## 2023-05-20 NOTE — Telephone Encounter (Signed)
Pt states her bp is elevated around 147/80, and it is causing her to feel very dizzy. Requesting an appt for today. No open slot for today. Please call pt back.

## 2023-05-20 NOTE — Telephone Encounter (Signed)
Return pt's call -stated when she stands up, she gets dizzy. She has been taking her BP meds. BP's this am was 143-80 and 140/80. Stated at this time, no c/o dizziness. Stated she has not been drinking water; encourage to increase her water intake, watch her salt intake over the holidays. And go to the UC if her symptoms worsen.

## 2023-06-03 ENCOUNTER — Inpatient Hospital Stay: Payer: 59 | Attending: Hematology

## 2023-06-13 ENCOUNTER — Ambulatory Visit (INDEPENDENT_AMBULATORY_CARE_PROVIDER_SITE_OTHER): Payer: 59 | Admitting: Student

## 2023-06-13 ENCOUNTER — Encounter: Payer: Self-pay | Admitting: Student

## 2023-06-13 ENCOUNTER — Encounter: Payer: Self-pay | Admitting: Hematology

## 2023-06-13 ENCOUNTER — Other Ambulatory Visit (HOSPITAL_COMMUNITY): Payer: Self-pay

## 2023-06-13 VITALS — BP 155/85 | HR 104 | Ht 61.0 in | Wt 157.6 lb

## 2023-06-13 DIAGNOSIS — F1910 Other psychoactive substance abuse, uncomplicated: Secondary | ICD-10-CM

## 2023-06-13 DIAGNOSIS — N393 Stress incontinence (female) (male): Secondary | ICD-10-CM | POA: Diagnosis not present

## 2023-06-13 DIAGNOSIS — Z89619 Acquired absence of unspecified leg above knee: Secondary | ICD-10-CM

## 2023-06-13 DIAGNOSIS — E1140 Type 2 diabetes mellitus with diabetic neuropathy, unspecified: Secondary | ICD-10-CM

## 2023-06-13 DIAGNOSIS — E785 Hyperlipidemia, unspecified: Secondary | ICD-10-CM

## 2023-06-13 DIAGNOSIS — M1A9XX0 Chronic gout, unspecified, without tophus (tophi): Secondary | ICD-10-CM

## 2023-06-13 DIAGNOSIS — I10 Essential (primary) hypertension: Secondary | ICD-10-CM

## 2023-06-13 DIAGNOSIS — S78112A Complete traumatic amputation at level between left hip and knee, initial encounter: Secondary | ICD-10-CM | POA: Diagnosis not present

## 2023-06-13 DIAGNOSIS — Z794 Long term (current) use of insulin: Secondary | ICD-10-CM

## 2023-06-13 DIAGNOSIS — Z7985 Long-term (current) use of injectable non-insulin antidiabetic drugs: Secondary | ICD-10-CM

## 2023-06-13 DIAGNOSIS — Z13820 Encounter for screening for osteoporosis: Secondary | ICD-10-CM

## 2023-06-13 DIAGNOSIS — M109 Gout, unspecified: Secondary | ICD-10-CM | POA: Diagnosis not present

## 2023-06-13 DIAGNOSIS — R32 Unspecified urinary incontinence: Secondary | ICD-10-CM

## 2023-06-13 DIAGNOSIS — Z7984 Long term (current) use of oral hypoglycemic drugs: Secondary | ICD-10-CM

## 2023-06-13 DIAGNOSIS — E782 Mixed hyperlipidemia: Secondary | ICD-10-CM

## 2023-06-13 DIAGNOSIS — M1A0720 Idiopathic chronic gout, left ankle and foot, without tophus (tophi): Secondary | ICD-10-CM

## 2023-06-13 MED ORDER — LOSARTAN POTASSIUM 100 MG PO TABS
100.0000 mg | ORAL_TABLET | Freq: Every day | ORAL | 1 refills | Status: DC
  Filled 2023-06-13 – 2023-10-29 (×4): qty 90, 90d supply, fill #0

## 2023-06-13 MED ORDER — CARVEDILOL 3.125 MG PO TABS
3.1250 mg | ORAL_TABLET | Freq: Two times a day (BID) | ORAL | 11 refills | Status: DC
  Filled 2023-06-13: qty 60, 30d supply, fill #0
  Filled 2023-08-07: qty 60, 30d supply, fill #1

## 2023-06-13 MED ORDER — ALLOPURINOL 100 MG PO TABS
50.0000 mg | ORAL_TABLET | Freq: Every day | ORAL | 2 refills | Status: DC
  Filled 2023-06-13: qty 30, 60d supply, fill #0
  Filled 2023-08-07: qty 30, 60d supply, fill #1
  Filled 2023-09-12 – 2023-10-04 (×2): qty 30, 60d supply, fill #0

## 2023-06-13 NOTE — Assessment & Plan Note (Addendum)
Patient presents with uncontrolled blood pressure of 167/69 with a repeat of 155/85.  She is currently on a regimen of amlodipine 10 mg and losartan 100 mg.  She was prescribed metoprolol in the past 50 mg daily, however has not taken to this since October.  Her uncontrolled hypertension is multifactorial due to cocaine use within the past 24 hours and medication noncompliance.  She was on carvedilol prior to the metoprolol but this was changed due to recurrent gout flares and starting her on colchicine.  With patient's cocaine use, will restart carvedilol and discontinue colchicine metoprolol. Plan Continue regimen of amlodipine 10 mg, losartan 100 mg.  Will begin coreg 3.125 mg twice daily

## 2023-06-13 NOTE — Assessment & Plan Note (Signed)
Resents with well-controlled type 2 diabetes mellitus.  Patient's A1c was 5.2 in October.  She is on a regimen of Farxiga and Trulicity 0.75 mg. Plan: -Check A1c today -Will continue regimen of Farxiga 10 mg and Trulicity 0.75 mg -Patient is due for an ophthalmology exam, she has an upcoming appointment with Dr. Dione Booze

## 2023-06-13 NOTE — Assessment & Plan Note (Signed)
Patient is on a regimen of Crestor 20 mg daily.  Prior LDL was 87 in 2017.  Will obtain a lipid profile this morning.

## 2023-06-13 NOTE — Progress Notes (Addendum)
Established Patient Office Visit  Subjective   Patient ID: [name], female    DOB: 09/19/1950  Age: 73 y.o. MRN: [id number]  Chief Complaint  Patient presents with   DME    Incontinence supplies    [name] is a 73 y.o. who presents to the clinic for a follow up. Please see problem based assessment and plan for additional details.   Patient Active Problem List   Diagnosis Date Noted   Port-A-Cath in place 10/12/2022   Sensation disturbance of skin 07/31/2022   Healthcare maintenance 03/07/2022   LLQ abdominal pain 11/21/2021   Encounter for wheelchair assessment 08/23/2021   Proteinuria 04/04/2021   Polysubstance abuse (HCC) 01/19/2021   Stage 3b chronic kidney disease (CKD) (HCC) 01/19/2021   Stress incontinence in female 01/11/2021   Lower GI bleeding 07/07/2020   Functional incontinence 02/08/2020   Spleen hematoma 11/02/2019   Microscopic hematuria 07/17/2019   Sleep disorder 02/04/2019   Generalized anxiety disorder 07/24/2017   Tobacco use disorder 07/24/2017   Atherosclerosis of abdominal aorta (HCC) 10/25/2016   Gout involving toe of right foot 02/28/2016   History of hepatitis C 03/15/2015   Substance use disorder 03/05/2015   Iron deficiency anemia due to chronic blood loss 02/23/2015   Vitamin D deficiency 12/02/2014   Preventative health care 11/30/2014   Depression 07/10/2013   Type 2 diabetes mellitus with diabetic neuropathy (HCC) 06/07/2013   Above knee amputation of left lower extremity (HCC) 08/18/2012   Angiodysplasia of duodenum 08/03/2011   Hyperlipidemia 08/03/2011   Essential hypertension, benign 07/22/2008   GERD 07/22/2008     Objective:     BP (!) 155/85 (BP Location: Left Arm, Patient Position: Sitting, Cuff Size: Normal)   Pulse (!) 104   Ht 5\' 1"  (1.549 m)   Wt 157 lb 9.6 oz (71.5 kg)   SpO2 99%   BMI 29.78 kg/m  BP Readings from Last 3 Encounters:  06/13/23 (!) 155/85  05/01/23 135/81  03/13/23 128/74   Wt  Readings from Last 3 Encounters:  06/13/23 157 lb 9.6 oz (71.5 kg)  05/01/23 156 lb 8 oz (71 kg)  03/13/23 153 lb 8 oz (69.6 kg)      Physical Exam Vitals reviewed.  Constitutional:      General: She is not in acute distress.    Appearance: She is not ill-appearing, toxic-appearing or diaphoretic.  Cardiovascular:     Rate and Rhythm: Normal rate and regular rhythm.     Heart sounds: No murmur heard. Pulmonary:     Effort: Pulmonary effort is normal.     Breath sounds: Normal breath sounds.  Skin:    General: Skin is warm and dry.  Neurological:     General: No focal deficit present.     Mental Status: She is alert.  Psychiatric:        Mood and Affect: Mood normal.      Results for orders placed or performed in visit on 06/13/23  Lipid Profile  Result Value Ref Range   Cholesterol, Total 132 100 - 199 mg/dL   Triglycerides 63 0 - 149 mg/dL   HDL 61 >78 mg/dL   VLDL Cholesterol Cal 13 5 - 40 mg/dL   LDL Chol Calc (NIH) 58 0 - 99 mg/dL   Chol/HDL Ratio 2.2 0.0 - 4.4 ratio  Hemoglobin A1c  Result Value Ref Range   Hgb A1c MFr Bld 5.5 4.8 - 5.6 %   Est. average glucose Bld  gHb Est-mCnc 111 mg/dL    Last metabolic panel Lab Results  Component Value Date   GLUCOSE 94 05/01/2023   NA 139 05/01/2023   K 3.8 05/01/2023   CL 106 05/01/2023   CO2 26 05/01/2023   BUN 17 05/01/2023   CREATININE 1.44 (H) 05/01/2023   GFRNONAA 39 (L) 05/01/2023   CALCIUM 8.8 (L) 05/01/2023   PHOS 5.6 (H) 01/19/2021   PROT 7.2 05/01/2023   ALBUMIN 4.0 05/01/2023   LABGLOB 2.7 07/31/2022   AGRATIO 1.5 07/31/2022   BILITOT 0.3 05/01/2023   ALKPHOS 99 05/01/2023   AST 15 05/01/2023   ALT 7 05/01/2023   ANIONGAP 7 05/01/2023   Last hemoglobin A1c Lab Results  Component Value Date   HGBA1C 5.5 06/13/2023      The ASCVD Risk score (Arnett DK, et al., 2019) failed to calculate for the following reasons:   Risk score cannot be calculated because patient has a medical history  suggesting prior/existing ASCVD    Assessment & Plan:   Problem List Items Addressed This Visit       Cardiovascular and Mediastinum   Essential hypertension, benign (Chronic)   Patient presents with uncontrolled blood pressure of 167/69 with a repeat of 155/85.  She is currently on a regimen of amlodipine 10 mg and losartan 100 mg.  She was prescribed metoprolol in the past 50 mg daily, however has not taken to this since October.  Her uncontrolled hypertension is multifactorial due to cocaine use within the past 24 hours and medication noncompliance.  She was on carvedilol prior to the metoprolol but this was changed due to recurrent gout flares and starting her on colchicine.  With patient's cocaine use, will restart carvedilol and discontinue colchicine metoprolol. Plan Continue regimen of amlodipine 10 mg, losartan 100 mg.  Will begin coreg 3.125 mg twice daily      Relevant Medications   losartan (COZAAR) 100 MG tablet   carvedilol (COREG) 3.125 MG tablet     Endocrine   Type 2 diabetes mellitus with diabetic neuropathy (HCC)   Resents with well-controlled type 2 diabetes mellitus.  Patient's A1c was 5.2 in October.  She is on a regimen of Farxiga and Trulicity 0.75 mg. Plan: -Check A1c today -Will continue regimen of Farxiga 10 mg and Trulicity 0.75 mg -Patient is due for an ophthalmology exam, she has an upcoming appointment with Dr. Dione Booze      Relevant Medications   losartan (COZAAR) 100 MG tablet   Other Relevant Orders   Hemoglobin A1c (Completed)     Musculoskeletal and Integument   Gout involving toe of right foot (Chronic)   Patient does not have an active gout flare today.  We discussed discontinuing colchicine for an acute flare and to continue taking allopurinol daily.  Patient was asked to call the office if she were to have an additional gout flare.  Would recommend avoiding colchicine in the future with her use of Coreg.      Relevant Medications    allopurinol (ZYLOPRIM) 100 MG tablet     Other   Above knee amputation of left lower extremity (HCC) - Primary (Chronic)   Relevant Orders   Ambulatory Referral for DME   Hyperlipidemia   Patient is on a regimen of Crestor 20 mg daily.  Prior LDL was 87 in 2017.  Will obtain a lipid profile this morning.      Relevant Medications   losartan (COZAAR) 100 MG tablet   carvedilol (COREG) 3.125  MG tablet   Other Relevant Orders   Lipid Profile (Completed)   Stress incontinence in female   Patient presents for incontinence supplies today.  We discussed the supplies and she was provided wipes, pull-ups, and gloves.      Polysubstance abuse [facility])   Patient has a history of substance use of cocaine.  She reports that she has been using cocaine here and there but has used cocaine within the past 24 hours.  She did that she has been trying to cut back and that the social worker did reach out to her since the last appointment.  Of note, patient reported she took 12 gabapentin's within 1 day last week because of her phantom limb pain.  Counseled patient on the risk of taking 12 gabapentin within 1 day.      Other Visit Diagnoses       Urinary incontinence, unspecified type       Relevant Orders   Ambulatory Referral for DME     Screening for osteoporosis       Relevant Orders   DG Bone Density         Return in about 3 months (around 09/11/2023) for DM. BP.    Faith Rogue, DO

## 2023-06-13 NOTE — Addendum Note (Signed)
Addended by: Faith Rogue on: 06/13/2023 10:34 AM   Modules accepted: Level of Service

## 2023-06-13 NOTE — Assessment & Plan Note (Signed)
Patient has a history of substance use of cocaine.  She reports that she has been using cocaine here and there but has used cocaine within the past 24 hours.  She did that she has been trying to cut back and that the social worker did reach out to her since the last appointment.  Of note, patient reported she took 12 gabapentin's within 1 day last week because of her phantom limb pain.  Counseled patient on the risk of taking 12 gabapentin within 1 day.

## 2023-06-13 NOTE — Assessment & Plan Note (Signed)
>>  ASSESSMENT AND PLAN FOR TYPE 2 DIABETES MELLITUS WITH DIABETIC NEUROPATHY (HCC) WRITTEN ON 06/13/2023 10:10 AM BY BENDER, EMILY, DO  Resents with well-controlled type 2 diabetes mellitus.  Patient's A1c was 5.2 in October.  She is on a regimen of Farxiga  and Trulicity  0.75 mg. Plan: -Check A1c today -Will continue regimen of Farxiga  10 mg and Trulicity  0.75 mg -Patient is due for an ophthalmology exam, she has an upcoming appointment with Dr. Octavia

## 2023-06-13 NOTE — Patient Instructions (Signed)
Thank you, Ms.[name] for allowing [place] to provide your care today. Today we discussed pressure, diabetes, cocaine use you have started you on carvedilol 3.125 mg twice a day.    I have ordered the following labs for you:   Lab Orders         Lipid Profile         Hemoglobin A1c        Referrals ordered today:    Referral Orders         Ambulatory Referral for DME      I have ordered the following medication/changed the following medications:   Stop the following medications: Medications Discontinued During This Encounter  Medication Reason   losartan (COZAAR) 100 MG tablet Reorder   allopurinol (ZYLOPRIM) 100 MG tablet Reorder   metoprolol succinate (TOPROL XL) 50 MG 24 hr tablet Change in therapy   colchicine 0.6 MG tablet Change in therapy     Start the following medications: Meds ordered this encounter  Medications   allopurinol (ZYLOPRIM) 100 MG tablet    Sig: Take 1/2 tablet (50 mg total) by mouth daily.  Begin medication after one week of colchicine treatment.    Dispense:  30 tablet    Refill:  2   losartan (COZAAR) 100 MG tablet    Sig: Take 1 tablet (100 mg total) by mouth daily.    Dispense:  90 tablet    Refill:  1   carvedilol (COREG) 3.125 MG tablet    Sig: Take 1 tablet (3.125 mg total) by mouth 2 (two) times daily.    Dispense:  60 tablet    Refill:  11     Follow up: 3 months    Remember: Call the office if you do have a gout flare.  Do not take the colchicine at home, it may interact with your other medications.  Should you have any questions or concerns please call the internal medicine clinic at [phone].     Faith Rogue, D.O. [facility]

## 2023-06-13 NOTE — Assessment & Plan Note (Signed)
Patient presents for incontinence supplies today.  We discussed the supplies and she was provided wipes, pull-ups, and gloves.

## 2023-06-13 NOTE — Addendum Note (Signed)
Addended by: Faith Rogue on: 06/13/2023 10:39 AM   Modules accepted: Orders

## 2023-06-13 NOTE — Assessment & Plan Note (Signed)
Patient does not have an active gout flare today.  We discussed discontinuing colchicine for an acute flare and to continue taking allopurinol daily.  Patient was asked to call the office if she were to have an additional gout flare.  Would recommend avoiding colchicine in the future with her use of Coreg.

## 2023-06-14 LAB — LIPID PANEL
Chol/HDL Ratio: 2.2 {ratio} (ref 0.0–4.4)
Cholesterol, Total: 132 mg/dL (ref 100–199)
HDL: 61 mg/dL (ref >39)
LDL Chol Calc (NIH): 58 mg/dL (ref 0–99)
Triglycerides: 63 mg/dL (ref 0–149)
VLDL Cholesterol Cal: 13 mg/dL (ref 5–40)

## 2023-06-14 LAB — HEMOGLOBIN A1C
Est. average glucose Bld gHb Est-mCnc: 111 mg/dL
Hgb A1c MFr Bld: 5.5 % (ref 4.8–5.6)

## 2023-06-19 NOTE — Addendum Note (Signed)
Addended by: Dickie La on: 06/19/2023 10:08 AM   Modules accepted: Level of Service

## 2023-06-21 NOTE — Progress Notes (Signed)
Internal Medicine Clinic Attending  Case discussed with the resident at the time of the visit.  We reviewed the resident's history and exam and pertinent patient test results.  I agree with the assessment, diagnosis, and plan of care documented in the resident's note.

## 2023-06-24 ENCOUNTER — Other Ambulatory Visit (HOSPITAL_COMMUNITY): Payer: Self-pay

## 2023-07-02 ENCOUNTER — Other Ambulatory Visit: Payer: Self-pay

## 2023-07-04 ENCOUNTER — Inpatient Hospital Stay: Payer: 59 | Attending: Hematology

## 2023-07-04 DIAGNOSIS — D500 Iron deficiency anemia secondary to blood loss (chronic): Secondary | ICD-10-CM | POA: Diagnosis present

## 2023-07-04 DIAGNOSIS — C19 Malignant neoplasm of rectosigmoid junction: Secondary | ICD-10-CM

## 2023-07-04 LAB — CMP (CANCER CENTER ONLY)
ALT: 7 U/L (ref 0–44)
AST: 16 U/L (ref 15–41)
Albumin: 4.1 g/dL (ref 3.5–5.0)
Alkaline Phosphatase: 110 U/L (ref 38–126)
Anion gap: 6 (ref 5–15)
BUN: 37 mg/dL — ABNORMAL HIGH (ref 8–23)
CO2: 27 mmol/L (ref 22–32)
Calcium: 9.1 mg/dL (ref 8.9–10.3)
Chloride: 108 mmol/L (ref 98–111)
Creatinine: 1.92 mg/dL — ABNORMAL HIGH (ref 0.44–1.00)
GFR, Estimated: 28 mL/min — ABNORMAL LOW (ref >60)
Glucose, Bld: 88 mg/dL (ref 70–99)
Potassium: 4.9 mmol/L (ref 3.5–5.1)
Sodium: 141 mmol/L (ref 135–145)
Total Bilirubin: 0.2 mg/dL (ref 0.0–1.2)
Total Protein: 7.3 g/dL (ref 6.5–8.1)

## 2023-07-04 LAB — CBC WITH DIFFERENTIAL (CANCER CENTER ONLY)
Abs Immature Granulocytes: 0.03 10*3/uL (ref 0.00–0.07)
Basophils Absolute: 0 10*3/uL (ref 0.0–0.1)
Basophils Relative: 1 %
Eosinophils Absolute: 0.2 10*3/uL (ref 0.0–0.5)
Eosinophils Relative: 2 %
HCT: 29.9 % — ABNORMAL LOW (ref 36.0–46.0)
Hemoglobin: 9 g/dL — ABNORMAL LOW (ref 12.0–15.0)
Immature Granulocytes: 1 %
Lymphocytes Relative: 18 %
Lymphs Abs: 1.2 10*3/uL (ref 0.7–4.0)
MCH: 26.3 pg (ref 26.0–34.0)
MCHC: 30.1 g/dL (ref 30.0–36.0)
MCV: 87.4 fL (ref 80.0–100.0)
Monocytes Absolute: 0.8 10*3/uL (ref 0.1–1.0)
Monocytes Relative: 13 %
Neutro Abs: 4.3 10*3/uL (ref 1.7–7.7)
Neutrophils Relative %: 65 %
Platelet Count: 374 10*3/uL (ref 150–400)
RBC: 3.42 MIL/uL — ABNORMAL LOW (ref 3.87–5.11)
RDW: 15 % (ref 11.5–15.5)
WBC Count: 6.5 10*3/uL (ref 4.0–10.5)
nRBC: 0 % (ref 0.0–0.2)

## 2023-07-04 LAB — FERRITIN: Ferritin: 5 ng/mL — ABNORMAL LOW (ref 11–307)

## 2023-07-11 ENCOUNTER — Other Ambulatory Visit: Payer: Self-pay | Admitting: Orthopedic Surgery

## 2023-07-11 ENCOUNTER — Telehealth: Payer: Self-pay

## 2023-07-11 NOTE — Telephone Encounter (Signed)
Contacted patient by telephone, per Dr. Mosetta Putt.  Let patient know that her iron level is very low and she is anemic again now, per Dr. Mosetta Putt. Let patient know that she will be getting a call from [facility] to schedule IV iron. Provided patient with the address to [facility]. Patient verbalized understanding and had no further concerns or questions.

## 2023-07-12 ENCOUNTER — Telehealth: Payer: Self-pay

## 2023-07-12 ENCOUNTER — Other Ambulatory Visit: Payer: Self-pay

## 2023-07-12 NOTE — Telephone Encounter (Signed)
Dr. Mosetta Putt and Olegario Shearer, patient will be scheduled as soon as possible.  Auth Submission: NO AUTH NEEDED Site of care: Site of care: [facility] Payer: [employer] dual complete medicaid Medication & CPT/J Code(s) submitted: Feraheme (ferumoxytol) [id number] Route of submission (phone, fax, portal): portal Phone # Fax # Auth type: Buy/Bill PB Units/visits requested: 510mg  x 2 doses Reference number: [id number] Approval from: 07/12/23 to 01/09/24

## 2023-07-22 ENCOUNTER — Ambulatory Visit (INDEPENDENT_AMBULATORY_CARE_PROVIDER_SITE_OTHER): Payer: 59

## 2023-07-22 VITALS — BP 108/74 | HR 95 | Temp 97.5°F | Resp 16 | Ht 63.0 in | Wt 154.4 lb

## 2023-07-22 DIAGNOSIS — D500 Iron deficiency anemia secondary to blood loss (chronic): Secondary | ICD-10-CM | POA: Diagnosis not present

## 2023-07-22 DIAGNOSIS — K922 Gastrointestinal hemorrhage, unspecified: Secondary | ICD-10-CM | POA: Diagnosis not present

## 2023-07-22 MED ORDER — SODIUM CHLORIDE 0.9 % IV SOLN
510.0000 mg | Freq: Once | INTRAVENOUS | Status: AC
  Administered 2023-07-22: 510 mg via INTRAVENOUS
  Filled 2023-07-22: qty 17

## 2023-07-22 MED ORDER — ACETAMINOPHEN 325 MG PO TABS
650.0000 mg | ORAL_TABLET | Freq: Once | ORAL | Status: AC
  Administered 2023-07-22: 650 mg via ORAL
  Filled 2023-07-22: qty 2

## 2023-07-22 MED ORDER — DIPHENHYDRAMINE HCL 25 MG PO CAPS
25.0000 mg | ORAL_CAPSULE | Freq: Once | ORAL | Status: AC
  Administered 2023-07-22: 25 mg via ORAL
  Filled 2023-07-22: qty 1

## 2023-07-22 NOTE — Progress Notes (Signed)
 Diagnosis: Iron Deficiency Anemia  Provider:  Chilton Greathouse MD  Procedure: IV Infusion  IV Type: Peripheral, IV Location: L Forearm  Feraheme (Ferumoxytol), Dose: 510 mg  Infusion Start Time: 0942  Infusion Stop Time: 1001  Post Infusion IV Care: Observation period completed and Peripheral IV Discontinued  Discharge: Condition: Good, Destination: Home . AVS Declined  Performed by:  Marlow Baars [place]tt, RN

## 2023-07-26 ENCOUNTER — Encounter: Payer: Self-pay | Admitting: Hematology

## 2023-07-26 ENCOUNTER — Other Ambulatory Visit (HOSPITAL_COMMUNITY): Payer: Self-pay

## 2023-07-30 ENCOUNTER — Ambulatory Visit: Payer: 59

## 2023-07-30 VITALS — BP 121/79 | HR 82 | Temp 98.3°F | Resp 16 | Ht 61.0 in | Wt 151.0 lb

## 2023-07-30 DIAGNOSIS — K922 Gastrointestinal hemorrhage, unspecified: Secondary | ICD-10-CM

## 2023-07-30 DIAGNOSIS — D500 Iron deficiency anemia secondary to blood loss (chronic): Secondary | ICD-10-CM

## 2023-07-30 MED ORDER — ACETAMINOPHEN 325 MG PO TABS
650.0000 mg | ORAL_TABLET | Freq: Once | ORAL | Status: AC
  Administered 2023-07-30: 650 mg via ORAL
  Filled 2023-07-30: qty 2

## 2023-07-30 MED ORDER — SODIUM CHLORIDE 0.9 % IV SOLN
510.0000 mg | Freq: Once | INTRAVENOUS | Status: AC
  Administered 2023-07-30: 510 mg via INTRAVENOUS
  Filled 2023-07-30: qty 17

## 2023-07-30 MED ORDER — DIPHENHYDRAMINE HCL 25 MG PO CAPS
25.0000 mg | ORAL_CAPSULE | Freq: Once | ORAL | Status: AC
  Administered 2023-07-30: 25 mg via ORAL
  Filled 2023-07-30: qty 1

## 2023-07-30 NOTE — Progress Notes (Signed)
 Diagnosis: Iron Deficiency Anemia  Provider:  Chilton Greathouse MD  Procedure: IV Infusion  IV Type: Peripheral, IV Location: R Antecubital  Feraheme (Ferumoxytol), Dose: 510 mg  Infusion Start Time: 0849  Infusion Stop Time: 0910  Post Infusion IV Care: Observation period completed and Peripheral IV Discontinued  Discharge: Condition: Good, Destination: Home . AVS Declined  Performed by:  Rico Ala, LPN

## 2023-07-30 NOTE — Assessment & Plan Note (Deleted)
secondary to GI blood loss, and Anemia of Chronic Disease -longstanding history of intermittent anemia dating back at least 2008 when Hg was 10. -h/o multiple GI bleeding in 2016, required blood transfusion, hospitalization and IV Feraheme -EGD and colonoscopy on 10/29/17 and 03/17/18 with Dr. Myrtie Neither, which were normal. 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 -Due to recurrent anemia requiring IV Feraheme and blood transfusions, she started bevacizumab for GI bleeding on 08/10/20. She responded very well with resolved anemia and less need for iv iron. This has been held intermittently due to elevated urine protein. Last dose given 04/03/22. -She is clinically doing well, no melena or other GI bleeding lately.  Lab reviewed, hemoglobin 11.9, iron level still pending. -Will continue IV iron and bevacizumab as needed -She has cut down her marijuana and cocaine use, I again encouraged her to completely stop.

## 2023-07-31 ENCOUNTER — Other Ambulatory Visit: Payer: Self-pay

## 2023-07-31 ENCOUNTER — Telehealth: Payer: Self-pay | Admitting: Internal Medicine

## 2023-07-31 LAB — HM DIABETES EYE EXAM

## 2023-08-01 ENCOUNTER — Inpatient Hospital Stay: Payer: 59

## 2023-08-01 ENCOUNTER — Inpatient Hospital Stay: Payer: 59 | Admitting: Hematology

## 2023-08-01 DIAGNOSIS — D500 Iron deficiency anemia secondary to blood loss (chronic): Secondary | ICD-10-CM

## 2023-08-02 ENCOUNTER — Other Ambulatory Visit: Payer: Self-pay

## 2023-08-02 ENCOUNTER — Encounter (HOSPITAL_BASED_OUTPATIENT_CLINIC_OR_DEPARTMENT_OTHER): Payer: Self-pay | Admitting: Orthopedic Surgery

## 2023-08-02 NOTE — Progress Notes (Signed)
   08/02/23 1228  PAT Phone Screen  Is the patient taking a GLP-1 receptor agonist? (S)  Yes  Has the patient been informed on holding medication? (S)  Yes (last dose of truilicity 3/2 will hold dose this sunday)  Do You Have Diabetes? (S)  Yes  Do You Have Hypertension? (S)  Yes  Have You Ever Been to the ER for Asthma? No  Have You Taken Oral Steroids in the Past 3 Months? No  Do you Take Phenteramine or any Other Diet Drugs? No  Recent  Lab Work, EKG, CXR? No  Do you have a history of heart problems? No  Have you ever had tests on your heart? Yes  What cardiac tests were performed? (S)  Echo  What date/year were cardiac tests completed? (S)  07/31/2017 EF 50-55%  Any Recent Hospitalizations? (S)  No  Height 5\' 1"  (1.549 m)  Weight 68.5 kg  Pat Appointment Scheduled (S)  Yes (cmet ekg)

## 2023-08-05 NOTE — Telephone Encounter (Signed)
 Copied from CRM [id number]. Topic: General - Other >> Aug 05, 2023 12:17 PM Corin V wrote: Reason for CRM: Gerilyn Pilgrim calling to verify if paperwork has been received to confirm medical necessity for incontinence supplies. Please call back to confirm receipt or request that paperwork be refaxed.  Called to speak with the patient.  She has already rec'd her Supplies a of 08/03/2023 as they were delivered to her house on Saturday.

## 2023-08-07 ENCOUNTER — Other Ambulatory Visit: Payer: Self-pay | Admitting: Student

## 2023-08-07 ENCOUNTER — Other Ambulatory Visit (HOSPITAL_COMMUNITY): Payer: Self-pay

## 2023-08-07 ENCOUNTER — Other Ambulatory Visit: Payer: Self-pay

## 2023-08-07 ENCOUNTER — Encounter (HOSPITAL_BASED_OUTPATIENT_CLINIC_OR_DEPARTMENT_OTHER)
Admission: RE | Admit: 2023-08-07 | Discharge: 2023-08-07 | Disposition: A | Discharge status: Home / Self Care | Source: Ambulatory Visit | Attending: Orthopedic Surgery | Admitting: Orthopedic Surgery

## 2023-08-07 DIAGNOSIS — F17210 Nicotine dependence, cigarettes, uncomplicated: Secondary | ICD-10-CM | POA: Diagnosis not present

## 2023-08-07 DIAGNOSIS — R9431 Abnormal electrocardiogram [ECG] [EKG]: Secondary | ICD-10-CM | POA: Insufficient documentation

## 2023-08-07 DIAGNOSIS — K219 Gastro-esophageal reflux disease without esophagitis: Secondary | ICD-10-CM | POA: Diagnosis not present

## 2023-08-07 DIAGNOSIS — Z01818 Encounter for other preprocedural examination: Secondary | ICD-10-CM | POA: Diagnosis present

## 2023-08-07 DIAGNOSIS — F1290 Cannabis use, unspecified, uncomplicated: Secondary | ICD-10-CM | POA: Diagnosis not present

## 2023-08-07 DIAGNOSIS — Z4589 Encounter for adjustment and management of other implanted devices: Secondary | ICD-10-CM | POA: Diagnosis present

## 2023-08-07 DIAGNOSIS — Z7985 Long-term (current) use of injectable non-insulin antidiabetic drugs: Secondary | ICD-10-CM | POA: Diagnosis not present

## 2023-08-07 DIAGNOSIS — Z7984 Long term (current) use of oral hypoglycemic drugs: Secondary | ICD-10-CM | POA: Diagnosis not present

## 2023-08-07 DIAGNOSIS — I10 Essential (primary) hypertension: Secondary | ICD-10-CM | POA: Diagnosis not present

## 2023-08-07 DIAGNOSIS — E1151 Type 2 diabetes mellitus with diabetic peripheral angiopathy without gangrene: Secondary | ICD-10-CM | POA: Diagnosis not present

## 2023-08-07 LAB — COMPREHENSIVE METABOLIC PANEL
ALT: 21 U/L (ref 0–44)
AST: 26 U/L (ref 15–41)
Albumin: 3.4 g/dL — ABNORMAL LOW (ref 3.5–5.0)
Alkaline Phosphatase: 66 U/L (ref 38–126)
Anion gap: 13 (ref 5–15)
BUN: 54 mg/dL — ABNORMAL HIGH (ref 8–23)
CO2: 16 mmol/L — ABNORMAL LOW (ref 22–32)
Calcium: 8.1 mg/dL — ABNORMAL LOW (ref 8.9–10.3)
Chloride: 103 mmol/L (ref 98–111)
Creatinine, Ser: 2.58 mg/dL — ABNORMAL HIGH (ref 0.44–1.00)
GFR, Estimated: 19 mL/min — ABNORMAL LOW (ref >60)
Glucose, Bld: 102 mg/dL — ABNORMAL HIGH (ref 70–99)
Potassium: 4.9 mmol/L (ref 3.5–5.1)
Sodium: 132 mmol/L — ABNORMAL LOW (ref 135–145)
Total Bilirubin: <0.2 mg/dL (ref 0.0–1.2)
Total Protein: 6.6 g/dL (ref 6.5–8.1)

## 2023-08-07 LAB — GLUCOSE, CAPILLARY: Glucose-Capillary: 107 mg/dL — ABNORMAL HIGH (ref 70–99)

## 2023-08-07 MED ORDER — OMEPRAZOLE 20 MG PO CPDR
20.0000 mg | DELAYED_RELEASE_CAPSULE | Freq: Every day | ORAL | 2 refills | Status: DC
  Filled 2023-08-07: qty 30, 30d supply, fill #0

## 2023-08-07 NOTE — Progress Notes (Signed)
 Patient arrived to have labs drawn and EKG. Patient states is "dizzy and not feeling well". Placed patient in wheelchair and took vital signs. Temp-98.1 pulse-89 O2sat-91% bp-90/65.  CBG obtained- 107.  Patient states she ate breakfast at 10am and that she took her medicine this morning. After obtaining normal EKG, patient states is "feeling better". Dr. Ricard Dillon evaluated patient and instructed her to go to ER if symptoms return. Patient verbalized understanding.

## 2023-08-07 NOTE — Progress Notes (Signed)

## 2023-08-08 ENCOUNTER — Ambulatory Visit (HOSPITAL_BASED_OUTPATIENT_CLINIC_OR_DEPARTMENT_OTHER)
Admission: RE | Admit: 2023-08-08 | Discharge: 2023-08-08 | Disposition: A | Discharge status: Home / Self Care | Payer: 59 | Attending: Orthopedic Surgery | Admitting: Orthopedic Surgery

## 2023-08-08 ENCOUNTER — Ambulatory Visit (HOSPITAL_BASED_OUTPATIENT_CLINIC_OR_DEPARTMENT_OTHER)

## 2023-08-08 ENCOUNTER — Other Ambulatory Visit: Payer: Self-pay

## 2023-08-08 ENCOUNTER — Other Ambulatory Visit (HOSPITAL_COMMUNITY): Payer: Self-pay

## 2023-08-08 ENCOUNTER — Ambulatory Visit (HOSPITAL_BASED_OUTPATIENT_CLINIC_OR_DEPARTMENT_OTHER): Admitting: Anesthesiology

## 2023-08-08 ENCOUNTER — Encounter (HOSPITAL_BASED_OUTPATIENT_CLINIC_OR_DEPARTMENT_OTHER): Payer: Self-pay | Admitting: Orthopedic Surgery

## 2023-08-08 ENCOUNTER — Encounter (HOSPITAL_BASED_OUTPATIENT_CLINIC_OR_DEPARTMENT_OTHER)
Admission: RE | Disposition: A | Discharge status: Home / Self Care | Payer: Self-pay | Source: Home / Self Care | Attending: Orthopedic Surgery

## 2023-08-08 DIAGNOSIS — E1151 Type 2 diabetes mellitus with diabetic peripheral angiopathy without gangrene: Secondary | ICD-10-CM | POA: Insufficient documentation

## 2023-08-08 DIAGNOSIS — T8484XA Pain due to internal orthopedic prosthetic devices, implants and grafts, initial encounter: Secondary | ICD-10-CM | POA: Diagnosis not present

## 2023-08-08 DIAGNOSIS — I129 Hypertensive chronic kidney disease with stage 1 through stage 4 chronic kidney disease, or unspecified chronic kidney disease: Secondary | ICD-10-CM

## 2023-08-08 DIAGNOSIS — N1832 Chronic kidney disease, stage 3b: Secondary | ICD-10-CM

## 2023-08-08 DIAGNOSIS — F1910 Other psychoactive substance abuse, uncomplicated: Secondary | ICD-10-CM

## 2023-08-08 DIAGNOSIS — F17210 Nicotine dependence, cigarettes, uncomplicated: Secondary | ICD-10-CM | POA: Diagnosis not present

## 2023-08-08 DIAGNOSIS — Z7985 Long-term (current) use of injectable non-insulin antidiabetic drugs: Secondary | ICD-10-CM | POA: Insufficient documentation

## 2023-08-08 DIAGNOSIS — E119 Type 2 diabetes mellitus without complications: Secondary | ICD-10-CM

## 2023-08-08 DIAGNOSIS — F1290 Cannabis use, unspecified, uncomplicated: Secondary | ICD-10-CM | POA: Insufficient documentation

## 2023-08-08 DIAGNOSIS — K219 Gastro-esophageal reflux disease without esophagitis: Secondary | ICD-10-CM | POA: Insufficient documentation

## 2023-08-08 DIAGNOSIS — I10 Essential (primary) hypertension: Secondary | ICD-10-CM | POA: Insufficient documentation

## 2023-08-08 DIAGNOSIS — Z7984 Long term (current) use of oral hypoglycemic drugs: Secondary | ICD-10-CM | POA: Insufficient documentation

## 2023-08-08 DIAGNOSIS — Z4589 Encounter for adjustment and management of other implanted devices: Secondary | ICD-10-CM | POA: Insufficient documentation

## 2023-08-08 HISTORY — PX: HARDWARE REMOVAL: SHX979

## 2023-08-08 LAB — GLUCOSE, CAPILLARY
Glucose-Capillary: 86 mg/dL (ref 70–99)
Glucose-Capillary: 151 mg/dL — ABNORMAL HIGH (ref 70–99)

## 2023-08-08 SURGERY — REMOVAL, HARDWARE
Anesthesia: Monitor Anesthesia Care | Site: Wrist | Laterality: Right

## 2023-08-08 MED ORDER — MIDAZOLAM HCL 2 MG/2ML IJ SOLN
INTRAMUSCULAR | Status: AC
  Filled 2023-08-08: qty 2

## 2023-08-08 MED ORDER — CLONIDINE HCL (ANALGESIA) 100 MCG/ML EP SOLN
EPIDURAL | Status: DC | PRN
  Administered 2023-08-08: 100 ug

## 2023-08-08 MED ORDER — OXYCODONE HCL 5 MG PO TABS: 5.0000 mg | ORAL_TABLET | Freq: Once | ORAL | Status: DC | PRN

## 2023-08-08 MED ORDER — SODIUM CHLORIDE 0.9 % IV SOLN: INTRAVENOUS | Status: DC | PRN

## 2023-08-08 MED ORDER — ACETAMINOPHEN 500 MG PO TABS
ORAL_TABLET | ORAL | Status: AC
  Filled 2023-08-08: qty 2

## 2023-08-08 MED ORDER — ACETAMINOPHEN 500 MG PO TABS
1000.0000 mg | ORAL_TABLET | Freq: Once | ORAL | Status: AC
Start: 2023-08-08 — End: 2023-08-08
  Administered 2023-08-08: 1000 mg via ORAL

## 2023-08-08 MED ORDER — ONDANSETRON HCL 4 MG/2ML IJ SOLN
INTRAMUSCULAR | Status: AC
  Filled 2023-08-08: qty 2

## 2023-08-08 MED ORDER — FENTANYL CITRATE (PF) 100 MCG/2ML IJ SOLN: 25.0000 ug | INTRAMUSCULAR | Status: DC | PRN

## 2023-08-08 MED ORDER — DEXAMETHASONE SODIUM PHOSPHATE 10 MG/ML IJ SOLN
INTRAMUSCULAR | Status: AC
Start: 2023-08-08 — End: ?
  Filled 2023-08-08: qty 1

## 2023-08-08 MED ORDER — CEFAZOLIN SODIUM-DEXTROSE 2-3 GM-%(50ML) IV SOLR
INTRAVENOUS | Status: DC | PRN
  Administered 2023-08-08: 2 g via INTRAVENOUS

## 2023-08-08 MED ORDER — MIDAZOLAM HCL 2 MG/2ML IJ SOLN
1.0000 mg | Freq: Once | INTRAMUSCULAR | Status: AC
  Administered 2023-08-08: 1 mg via INTRAVENOUS

## 2023-08-08 MED ORDER — FENTANYL CITRATE (PF) 100 MCG/2ML IJ SOLN
50.0000 ug | Freq: Once | INTRAMUSCULAR | Status: AC
  Administered 2023-08-08: 50 ug via INTRAVENOUS

## 2023-08-08 MED ORDER — ONDANSETRON HCL 4 MG/2ML IJ SOLN
INTRAMUSCULAR | Status: DC | PRN
  Administered 2023-08-08: 4 mg via INTRAVENOUS

## 2023-08-08 MED ORDER — BUPIVACAINE-EPINEPHRINE (PF) 0.5% -1:200000 IJ SOLN
INTRAMUSCULAR | Status: DC | PRN
  Administered 2023-08-08: 30 mL via PERINEURAL

## 2023-08-08 MED ORDER — FENTANYL CITRATE (PF) 100 MCG/2ML IJ SOLN
INTRAMUSCULAR | Status: AC
  Filled 2023-08-08: qty 2

## 2023-08-08 MED ORDER — LACTATED RINGERS IV SOLN: INTRAVENOUS | Status: DC

## 2023-08-08 MED ORDER — PHENYLEPHRINE HCL (PRESSORS) 10 MG/ML IV SOLN
INTRAVENOUS | Status: DC | PRN
  Administered 2023-08-08 (×3): 160 ug via INTRAVENOUS
  Administered 2023-08-08: 240 ug via INTRAVENOUS
  Administered 2023-08-08: 80 ug via INTRAVENOUS
  Administered 2023-08-08: 240 ug via INTRAVENOUS
  Administered 2023-08-08: 160 ug via INTRAVENOUS

## 2023-08-08 MED ORDER — PROPOFOL 500 MG/50ML IV EMUL
INTRAVENOUS | Status: DC | PRN
  Administered 2023-08-08: 75 ug/kg/min via INTRAVENOUS

## 2023-08-08 MED ORDER — HYDROCODONE-ACETAMINOPHEN 5-325 MG PO TABS
1.0000 | ORAL_TABLET | Freq: Four times a day (QID) | ORAL | 0 refills | Status: DC | PRN
  Filled 2023-08-08: qty 20, 3d supply, fill #0

## 2023-08-08 MED ORDER — DROPERIDOL 2.5 MG/ML IJ SOLN: 0.6250 mg | Freq: Once | INTRAMUSCULAR | Status: DC | PRN

## 2023-08-08 MED ORDER — LACTATED RINGERS IV SOLN: INTRAVENOUS | Status: DC | PRN

## 2023-08-08 MED ORDER — PROPOFOL 10 MG/ML IV BOLUS
INTRAVENOUS | Status: AC
  Filled 2023-08-08: qty 20

## 2023-08-08 MED ORDER — 0.9 % SODIUM CHLORIDE (POUR BTL) OPTIME
TOPICAL | Status: DC | PRN
  Administered 2023-08-08: 200 mL

## 2023-08-08 MED ORDER — FENTANYL CITRATE (PF) 100 MCG/2ML IJ SOLN
INTRAMUSCULAR | Status: AC
Start: 2023-08-08 — End: ?
  Filled 2023-08-08: qty 2

## 2023-08-08 MED ORDER — OXYCODONE HCL 5 MG/5ML PO SOLN: 5.0000 mg | Freq: Once | ORAL | Status: DC | PRN

## 2023-08-08 SURGICAL SUPPLY — 41 items
BLADE MINI RND TIP GREEN BEAV (BLADE) IMPLANT
BLADE SURG 15 STRL LF DISP TIS (BLADE) ×2 IMPLANT
BNDG COHESIVE 1X5 TAN STRL LF (GAUZE/BANDAGES/DRESSINGS) IMPLANT
BNDG ELASTIC 2INX 5YD STR LF (GAUZE/BANDAGES/DRESSINGS) IMPLANT
BNDG ELASTIC 3INX 5YD STR LF (GAUZE/BANDAGES/DRESSINGS) ×1 IMPLANT
BNDG ESMARK 4X9 LF (GAUZE/BANDAGES/DRESSINGS) IMPLANT
BNDG GAUZE DERMACEA FLUFF 4 (GAUZE/BANDAGES/DRESSINGS) ×1 IMPLANT
CHLORAPREP W/TINT 26 (MISCELLANEOUS) ×1 IMPLANT
CORD BIPOLAR FORCEPS 12FT (ELECTRODE) ×1 IMPLANT
COVER BACK TABLE 60X90IN (DRAPES) ×1 IMPLANT
COVER MAYO STAND STRL (DRAPES) ×1 IMPLANT
CUFF TOURN SGL QUICK 18X4 (TOURNIQUET CUFF) ×1 IMPLANT
DRAPE EXTREMITY T 121X128X90 (DISPOSABLE) ×1 IMPLANT
DRAPE SURG 17X23 STRL (DRAPES) ×1 IMPLANT
GAUZE PAD ABD 8X10 STRL (GAUZE/BANDAGES/DRESSINGS) IMPLANT
GAUZE SPONGE 4X4 12PLY STRL (GAUZE/BANDAGES/DRESSINGS) ×1 IMPLANT
GAUZE XEROFORM 1X8 LF (GAUZE/BANDAGES/DRESSINGS) ×1 IMPLANT
GLOVE BIO SURGEON STRL SZ7.5 (GLOVE) ×1 IMPLANT
GLOVE BIOGEL PI IND STRL 8 (GLOVE) ×1 IMPLANT
GOWN STRL REUS W/ TWL LRG LVL3 (GOWN DISPOSABLE) ×1 IMPLANT
GOWN STRL REUS W/TWL XL LVL3 (GOWN DISPOSABLE) ×1 IMPLANT
NDL HYPO 25X1 1.5 SAFETY (NEEDLE) IMPLANT
NEEDLE HYPO 25X1 1.5 SAFETY (NEEDLE) IMPLANT
NS IRRIG 1000ML POUR BTL (IV SOLUTION) ×1 IMPLANT
PACK BASIN DAY SURGERY FS (CUSTOM PROCEDURE TRAY) ×1 IMPLANT
PAD CAST 3X4 CTTN HI CHSV (CAST SUPPLIES) IMPLANT
PADDING CAST ABS COTTON 4X4 ST (CAST SUPPLIES) ×1 IMPLANT
SLING ARM FOAM STRAP LRG (SOFTGOODS) IMPLANT
SPIKE FLUID TRANSFER (MISCELLANEOUS) ×1 IMPLANT
SPLINT PLASTER CAST XFAST 3X15 (CAST SUPPLIES) IMPLANT
STOCKINETTE 4X48 STRL (DRAPES) ×1 IMPLANT
STRIP CLOSURE SKIN 1/4X4 (GAUZE/BANDAGES/DRESSINGS) IMPLANT
SUT ETHILON 4 0 PS 2 18 (SUTURE) ×1 IMPLANT
SUT MNCRL AB 4-0 PS2 18 (SUTURE) IMPLANT
SUT VIC AB 2-0 SH 27XBRD (SUTURE) IMPLANT
SUT VIC AB 3-0 FS2 27 (SUTURE) IMPLANT
SUT VIC AB 4-0 PS2 18 (SUTURE) IMPLANT
SYR BULB EAR ULCER 3OZ GRN STR (SYRINGE) ×1 IMPLANT
SYR CONTROL 10ML LL (SYRINGE) IMPLANT
TOWEL GREEN STERILE FF (TOWEL DISPOSABLE) ×2 IMPLANT
UNDERPAD 30X36 HEAVY ABSORB (UNDERPADS AND DIAPERS) ×1 IMPLANT

## 2023-08-08 NOTE — Op Note (Signed)
 NAME: [name] MEDICAL RECORD NO: [id number] DATE OF BIRTH: 21-Oct-1950 FACILITY: [facility] LOCATION: [facility] PHYSICIAN: Tami Ribas, MD   OPERATIVE REPORT   DATE OF PROCEDURE: 08/08/23    PREOPERATIVE DIAGNOSIS: Right wrist retained hardware   POSTOPERATIVE DIAGNOSIS: Right wrist retained hardware   PROCEDURE: Removal of 3 screws right carpus   SURGEON:  Betha Loa, M.D.   ASSISTANT: Cindee Salt, MD   ANESTHESIA:  Regional with sedation   INTRAVENOUS FLUIDS:  Per anesthesia flow sheet.   ESTIMATED BLOOD LOSS:  Minimal.   COMPLICATIONS:  None.   SPECIMENS:  none   TOURNIQUET TIME:    Total Tourniquet Time Documented: Upper Arm (Right) - 47 minutes Total: Upper Arm (Right) - 47 minutes    DISPOSITION:  Stable to PACU.   INDICATIONS: 73 year old female 2 years status post right wrist fusion.  She has had recurrent wrist pain.  Radiographs show prominence of the screw.  She wishes to have the hardware removed from the wrist.  Risks, benefits and alternatives of surgery were discussed including the risks of blood loss, infection, damage to nerves, vessels, tendons, ligaments, bone for surgery, need for additional surgery, complications with wound healing, continued pain, stiffness, continued pain.  She voiced understanding of these risks and elected to proceed.  OPERATIVE COURSE:  After being identified preoperatively by myself,  the patient and I agreed on the procedure and site of the procedure.  The surgical site was marked.  Surgical consent had been signed. Preoperative IV antibiotic prophylaxis was given. She was transferred to the operating room and placed on the operating table in supine position with the Right upper extremity on an arm board.  Sedation was induced by the anesthesiologist. A regional block had been performed by anesthesia in preoperative holding.    Right upper extremity was prepped and draped in normal sterile orthopedic  fashion.  A surgical pause was performed between the surgeons, anesthesia, and operating room staff and all were in agreement as to the patient, procedure, and site of procedure.  Tourniquet at the proximal aspect of the extremity was inflated to 250 mmHg after exsanguination of the arm with an Esmarch bandage.  Previous incision was followed.  This is carried in subcutaneous tissues by spreading technique.  The retinaculum was incised over the fourth dorsal compartment and the tendons retracted.  The capsule was sharply incised and elevated from the carpus.  Wrist was mobilized to access screws and the proximal aspect of the lunate.  Some of the dorsal rim of the radius had to be taken down to allow access to the screw.  The 2 screws through the lunate were then able to be cleared from surrounding soft tissue and removed with the screwdriver.  More radial screw was prominent.  There was loss of cartilage in the lunate facet.  The screw at the triquetrum was able to be accessed through the same incision.  This was removed as well.  There was a ridge of bone in the dorsum of the lunate that would impinge on the dorsal rim of the radius with extension.  This was taken down with the rondure.  This allowed better extension of the wrist.  The wound and joint were copiously irrigated with sterile saline.  The capsule was closed with 2-0 Vicryl suture in a running fashion.  Extensor retinaculum was repaired with a 4-0 Vicryl suture in a running fashion.  Skin was closed with 4-0 nylon horizontal mattress fashion.  Wound was dressed with sterile Xeroform 4 x 4's and wrapped with a Kerlix bandage.  Volar splint was placed and wrapped with Kerlix and Ace bandage.  The tourniquet was deflated at 47 minutes.  Fingertips were pink with brisk capillary refill after deflation of tourniquet.  The operative  drapes were broken down.  The patient was awoken from anesthesia safely.  She was transferred back to the stretcher and taken  to PACU in stable condition.  I will see her back in the office in 1 week for postoperative followup.  I will give her a prescription for Norco 5/325 1-2 tabs PO q6 hours prn pain, dispense # 20.   Betha Loa, MD Electronically signed, 08/08/23

## 2023-08-08 NOTE — Progress Notes (Signed)
Assisted Dr. Howze with right, supraclavicular, ultrasound guided block. Side rails up, monitors on throughout procedure. See vital signs in flow sheet. Tolerated Procedure well. 

## 2023-08-08 NOTE — H&P (Signed)
 [name] is an 73 y.o. female.   Chief Complaint: retained hardware HPI: 73 yo female with retained screws in right wrist after carpal fusion 2 years ago.  These have become prominent and painful.  She wishes to have the screws removed.  Allergies:  Allergies  Allergen Reactions   Ciprofloxacin Hives and Swelling   Morphine And Codeine Other (See Comments)    Makes pt sad and paranoid   Penicillins Hives and Swelling    Has patient had a PCN reaction causing immediate rash, facial/tongue/throat swelling, SOB or lightheadedness with hypotension:YES Has patient had a PCN reaction causing severe rash involving mucus membranes or skin necrosis:UNSURE Has patient had a PCN reaction that required hospitalization:YES Has patient had a PCN reaction occurring within the last 10 years:No If all of the above answers are "NO", then may proceed with Cephalosporin use.    Codeine Other (See Comments)    sweating per pt (not hives/swelling)   Lisinopril Cough    New onset dry cough after starting lisinopril. Resolved upon switching to losartan.   Feraheme [Ferumoxytol] Itching    Tolerated Feraheme 6/26 & 7/21 pre-medications prior to medication    Past Medical History:  Diagnosis Date   Allergic rhinitis    Anemia, iron deficiency 05/03/2011   2/2 GI AVMs - recieves iron infusions as well as periodic red blood cell transfusions   Anxiety    Arteriovenous malformation of duodenum 02/25/2015   CARPAL TUNNEL RELEASE, HX OF 07/22/2008   Chest pain 10/28/2019   Colon polyps    Diabetes mellitus without complication (HCC)    type 2   Difficulty sleeping    takes trazadone for sleep   Diverticulosis    Electrolyte abnormality 01/25/2021   Fall 01/03/2020   GERD (gastroesophageal reflux disease)    GI bleed 07/07/2020   Hepatitis C    in SVR with Harvoni 12/2014-02/2015.    History of hernia repair 08/18/2012   History of porphyria 11/30/2014   Porphyria cutanea tarda in setting of chronic  Hepatitis C, frequent  IV iron infusions, and alcohol abuse    Hypertension    Hypocalcemia 10/28/2019   Hypocalcemia 01/19/2021   Hypokalemia 09/27/2014   Hypokalemia 01/19/2021   Hypomagnesemia 01/19/2021   Kidney disease 02/04/2019   Peripheral vascular disease (HCC)    Prolonged QT interval 01/19/2021   Stroke (HCC) 2010   no residual deficits   Substance use disorder    cocaine   Tachycardia 07/10/2017   Trichomonas vaginitis 04/16/2019    Past Surgical History:  Procedure Laterality Date   ABDOMINAL HYSTERECTOMY     APPLICATION OF WOUND VAC N/A 06/21/2014   Procedure: APPLICATION OF WOUND VAC;  Surgeon: Abigail Miyamoto, MD;  Location: [facility] OR;  Service: General;  Laterality: N/A;   CARPAL TUNNEL RELEASE     rt hand   COLONOSCOPY WITH PROPOFOL N/A 03/17/2018   Procedure: COLONOSCOPY WITH PROPOFOL;  Surgeon: Sherrilyn Rist, MD;  Location: [facility];  Service: Gastroenterology;  Laterality: N/A;   ENTEROSCOPY N/A 12/04/2012   Procedure: ENTEROSCOPY;  Surgeon: Theda Belfast, MD;  Location: [facility];  Service: Endoscopy;  Laterality: N/A;   ENTEROSCOPY N/A 12/18/2012   Procedure: ENTEROSCOPY;  Surgeon: Theda Belfast, MD;  Location: [facility];  Service: Endoscopy;  Laterality: N/A;   ENTEROSCOPY N/A 02/25/2015   Procedure: ENTEROSCOPY;  Surgeon: Louis Meckel, MD;  Location: [facility];  Service: Endoscopy;  Laterality: N/A;   ENTEROSCOPY N/A 10/29/2017  Procedure: ENTEROSCOPY;  Surgeon: Sherrilyn Rist, MD;  Location: [facility];  Service: Gastroenterology;  Laterality: N/A;   ENTEROSCOPY N/A 07/08/2020   Procedure: ENTEROSCOPY;  Surgeon: Rachael Fee, MD;  Location: [facility];  Service: Endoscopy;  Laterality: N/A;   ESOPHAGOGASTRODUODENOSCOPY  05/05/2011   Procedure: ESOPHAGOGASTRODUODENOSCOPY (EGD);  Surgeon: Erick Blinks, MD;  Location: [facility];  Service: Gastroenterology;  Laterality: N/A;  Dr. Rhea Belton will do procedure for Dr. [name] Saturday.    ESOPHAGOGASTRODUODENOSCOPY  05/08/2011   Procedure: ESOPHAGOGASTRODUODENOSCOPY (EGD);  Surgeon: Theda Belfast;  [facility]: [facility];  Service: Endoscopy;  Laterality: N/A;   ESOPHAGOGASTRODUODENOSCOPY  06/07/2011   Procedure: ESOPHAGOGASTRODUODENOSCOPY (EGD);  Surgeon: Theda Belfast, MD;  Location: [facility];  Service: Endoscopy;  Laterality: N/A;   ESOPHAGOGASTRODUODENOSCOPY  12/20/2011   Procedure: ESOPHAGOGASTRODUODENOSCOPY (EGD);  Surgeon: Theda Belfast, MD;  Location: [facility];  Service: Endoscopy;  Laterality: N/A;   EYE SURGERY Bilateral    cataracts   FLEXIBLE SIGMOIDOSCOPY  12/21/2011   Procedure: FLEXIBLE SIGMOIDOSCOPY;  Surgeon: Theda Belfast, MD;  [facility]: [facility];  Service: Endoscopy;  Laterality: N/A;   HOT HEMOSTASIS  06/07/2011   Procedure: HOT HEMOSTASIS (ARGON PLASMA COAGULATION/BICAP);  Surgeon: Theda Belfast, MD;  Location: [facility];  Service: Endoscopy;  Laterality: N/A;   HOT HEMOSTASIS N/A 07/08/2020   Procedure: HOT HEMOSTASIS (ARGON PLASMA COAGULATION/BICAP);  Surgeon: Rachael Fee, MD;  Location: [facility];  Service: Endoscopy;  Laterality: N/A;   INCISIONAL HERNIA REPAIR N/A 06/01/2014   Procedure: ATTEMPTED LAPAROSCOPIC AND OPEN INCISIONAL HERNIA REPAIR WITH MESH;  Surgeon: Abigail Miyamoto, MD;  Location: [facility] OR;  Service: General;  Laterality: N/A;   INSERTION OF MESH N/A 06/01/2014   Procedure: INSERTION OF MESH;  Surgeon: Abigail Miyamoto, MD;  Location: [facility];  Service: General;  Laterality: N/A;   LAPAROTOMY  02/16/2012   Procedure: EXPLORATORY LAPAROTOMY;  Surgeon: Mariella Saa, MD;  Location: [facility];  Service: General;  Laterality: N/A;  oversewing of anastomotic leak and rigid sigmoidoscopy   LAPAROTOMY N/A 06/21/2014   Procedure: ABDOMINAL WOUND EXPLORATION;  Surgeon: Abigail Miyamoto, MD;  Location: [facility];  Service: General;  Laterality: N/A;   LEG AMPUTATION ABOVE KNEE     left   PARTIAL COLECTOMY  02/15/2012   Procedure:  PARTIAL COLECTOMY;  Surgeon: Shelly Rubenstein, MD;  Location: [facility];  Service: General;  Laterality: N/A;   SUBMUCOSAL INJECTION  07/08/2020   Procedure: SUBMUCOSAL INJECTION;  Surgeon: Rachael Fee, MD;  Location: [facility];  Service: Endoscopy;;   TONSILLECTOMY     WRIST FUSION Right 04/27/2021   Procedure: SCAPHOID EXCISION  WITH 4 BONE FUSION RIGHT WRIST;  Surgeon: Betha Loa, MD;  Location: [facility];  Service: Orthopedics;  Laterality: Right;    Family History: Family History  Problem Relation Age of Onset   Cancer Father        unknown   Hypertension Mother    Diverticulitis Mother     Social History:   reports that she has been smoking cigarettes. She has a 8.5 pack-year smoking history. She has never used smokeless tobacco. She reports current alcohol use. She reports current drug use. Drugs: Cocaine and Marijuana.  Medications: Medications Prior to Admission  Medication Sig Dispense Refill   allopurinol (ZYLOPRIM) 100 MG tablet Take 1/2 tablet (50 mg total) by mouth daily.  Begin medication after one week of colchicine treatment. 30 tablet 2   amLODipine (NORVASC) 10 MG tablet  Take 1 tablet (10 mg total) by mouth daily. 90 tablet 1   calcium-vitamin D (OSCAL WITH D) 500-200 MG-UNIT tablet Take 1 tablet by mouth 2 (two) times daily. 60 tablet 0   dapagliflozin propanediol (FARXIGA) 10 MG TABS tablet Take 1 tablet by mouth daily. 30 tablet 12   famotidine (PEPCID) 20 MG tablet Take 1 tablet (20 mg total) by mouth 2 (two) times daily. 60 tablet 1   gabapentin (NEURONTIN) 400 MG capsule Take 1 capsule (400 mg total) by mouth in the morning AND 1 capsule (400 mg total) daily in the afternoon AND 2 capsules (800 mg total) before bedtime. 360 capsule 3   losartan (COZAAR) 100 MG tablet Take 1 tablet (100 mg total) by mouth daily. 90 tablet 1   omeprazole (PRILOSEC) 20 MG capsule Take 1 capsule (20 mg total) by mouth daily. 30 capsule 2   rosuvastatin  (CRESTOR) 20 MG tablet Take 1 tablet (20 mg total) by mouth daily. 90 tablet 2   ACCU-CHEK FASTCLIX LANCETS MISC Use to test blood glucose 3 times daily. 100 each 11   Blood Glucose Monitoring Suppl (ACCU-CHEK GUIDE) w/Device KIT 1 each by Does not apply route 3 (three) times daily. The patient is insulin requiring, ICD 10 code E11.65. The patient tests 3 times a day. 1 kit 1   camphor-menthol (SARNA) lotion Apply 1 Application topically as needed for itching. 222 mL 1   carvedilol (COREG) 3.125 MG tablet Take 1 tablet (3.125 mg total) by mouth 2 (two) times daily. (Patient not taking: Reported on 08/02/2023) 60 tablet 11   Dulaglutide (TRULICITY) 0.75 MG/0.5ML SOAJ Inject 0.75 mg into the skin once a week. 6 mL 3   folic acid (FOLVITE) 1 MG tablet Take 1 tablet (1 mg total) by mouth daily. 30 tablet 2   glucose blood (ACCU-CHEK GUIDE) test strip Use as instructed 100 strip 3    Results for orders placed or performed during the hospital encounter of 08/08/23 (from the past 48 hours)  Comprehensive metabolic panel per protocol     Status: Abnormal   Collection Time: 08/07/23  3:00 PM  Result Value Ref Range   Sodium 132 (L) 135 - 145 mmol/L   Potassium 4.9 3.5 - 5.1 mmol/L   Chloride 103 98 - 111 mmol/L   CO2 16 (L) 22 - 32 mmol/L   Glucose, Bld 102 (H) 70 - 99 mg/dL    Comment: Glucose reference range applies only to samples taken after fasting for at least 8 hours.   BUN 54 (H) 8 - 23 mg/dL   Creatinine, Ser 0.86 (H) 0.44 - 1.00 mg/dL   Calcium 8.1 (L) 8.9 - 10.3 mg/dL   Total Protein 6.6 6.5 - 8.1 g/dL   Albumin 3.4 (L) 3.5 - 5.0 g/dL   AST 26 15 - 41 U/L   ALT 21 0 - 44 U/L   Alkaline Phosphatase 66 38 - 126 U/L   Total Bilirubin <0.2 0.0 - 1.2 mg/dL   GFR, Estimated 19 (L) >60 mL/min    Comment: (NOTE) Calculated using the CKD-EPI Creatinine Equation (2021)    Anion gap 13 5 - 15    Comment: Performed at [facility], [address], [place], [place] [address]  Glucose,  capillary     Status: None   Collection Time: 08/08/23  8:02 AM  Result Value Ref Range   Glucose-Capillary 86 70 - 99 mg/dL    Comment: Glucose reference range applies only to  samples taken after fasting for at least 8 hours.   Comment 1 Notify RN    Comment 2 Document in Chart    *Note: Due to a large number of results and/or encounters for the requested time period, some results have not been displayed. A complete set of results can be found in Results Review.    No results found.    Blood pressure 118/87, pulse 85, resp. rate 14, height 5\' 1"  (1.549 m), weight 56.2 kg, SpO2 95%.  General appearance: alert, cooperative, and appears stated age Head: Normocephalic, without obvious abnormality, atraumatic Neck: supple, symmetrical, trachea midline Extremities: Intact sensation and capillary refill all digits.  +epl/fpl/io.  No wounds.  Skin: Skin color, texture, turgor normal. No rashes or lesions Neurologic: Grossly normal Incision/Wound: none  Assessment/Plan Right wrist retained hardware.  Non operative and operative treatment options have been discussed with the patient and patient wishes to proceed with operative treatment. Risks, benefits and alternatives of surgery were discussed including risks of blood loss, infection, damage to nerves/vessels/tendons/ligament/bone, failure of surgery, need for additional surgery, complication with wound healing, stiffness, continued pain.  She voiced understanding of these risks and elected to proceed.    Betha Loa 08/08/2023, 8:16 AM

## 2023-08-08 NOTE — Anesthesia Procedure Notes (Signed)
 Anesthesia Regional Block: Supraclavicular block   Pre-Anesthetic Checklist: , timeout performed,  Correct Patient, Correct Site, Correct Laterality,  Correct Procedure, Correct Position, site marked,  Risks and benefits discussed,  Pre-op evaluation,  At surgeon's request and post-op pain management  Laterality: Right  Prep: Maximum Sterile Barrier Precautions used, chloraprep       Needles:  Injection technique: Single-shot  Needle Type: Echogenic Stimulator Needle     Needle Length: 9cm  Needle Gauge: 22     Additional Needles:   Procedures:,,,, ultrasound used (permanent image in chart),,    Narrative:  Start time: 08/08/2023 9:09 AM End time: 08/08/2023 9:12 AM Injection made incrementally with aspirations every 5 mL.  Performed by: Personally  Anesthesiologist: Kaylyn Layer, MD  Additional Notes: Risks, benefits, and alternative discussed. Patient gave consent for procedure. Patient prepped and draped in sterile fashion. Sedation administered, patient remains easily responsive to voice. Relevant anatomy identified with ultrasound guidance. Local anesthetic given in 5cc increments with no signs or symptoms of intravascular injection. No pain or paraesthesias with injection. Patient monitored throughout procedure with signs of LAST or immediate complications. Tolerated well. Ultrasound image placed in chart.  Amalia Greenhouse, MD

## 2023-08-08 NOTE — Anesthesia Preprocedure Evaluation (Addendum)
 Anesthesia Evaluation  Patient identified by MRN, date of birth, ID band Patient awake    Reviewed: Allergy & Precautions, NPO status , Patient's Chart, lab work & pertinent test results, reviewed documented beta blocker date and time   History of Anesthesia Complications Negative for: history of anesthetic complications  Airway Mallampati: III  TM Distance: >3 FB Neck ROM: Full    Dental no notable dental hx.    Pulmonary Current Smoker and Patient abstained from smoking.   Pulmonary exam normal        Cardiovascular hypertension, Pt. on medications and Pt. on home beta blockers + Peripheral Vascular Disease  Normal cardiovascular exam     Neuro/Psych   Anxiety Depression    CVA (2010)    GI/Hepatic ,GERD  Medicated,,(+)     substance abuse (cocaine 2 weeks ago)  cocaine use and marijuana use, Hepatitis -, C  Endo/Other  diabetes, Type 2, Oral Hypoglycemic Agents    Renal/GU Renal InsufficiencyRenal disease (Cr 2.58)  negative genitourinary   Musculoskeletal  (+) Arthritis ,    Abdominal   Peds  Hematology negative hematology ROS (+)   Anesthesia Other Findings Day of surgery medications reviewed with patient.  Reproductive/Obstetrics                             Anesthesia Physical Anesthesia Plan  ASA: 3  Anesthesia Plan: MAC and Regional   Post-op Pain Management: Tylenol PO (pre-op)*   Induction:   PONV Risk Score and Plan: 1 and Propofol infusion, Treatment may vary due to age or medical condition, Ondansetron and Midazolam  Airway Management Planned: Natural Airway and Simple Face Mask  Additional Equipment: None  Intra-op Plan:   Post-operative Plan:   Informed Consent: I have reviewed the patients History and Physical, chart, labs and discussed the procedure including the risks, benefits and alternatives for the proposed anesthesia with the patient or authorized  representative who has indicated his/her understanding and acceptance.       Plan Discussed with: CRNA  Anesthesia Plan Comments:        Anesthesia Quick Evaluation

## 2023-08-08 NOTE — Transfer of Care (Signed)
 Immediate Anesthesia Transfer of Care Note  Patient: [name]  Procedure(s) Performed: HARDWARE REMOVAL RIGHT WRIST (Right: Wrist)  Patient Location: PACU  Anesthesia Type:MAC and Regional  Level of Consciousness: awake, alert , and oriented  Airway & Oxygen Therapy: Patient Spontanous Breathing and Patient connected to face mask oxygen  Post-op Assessment: Report given to RN and Post -op Vital signs reviewed and stable  Post vital signs: Reviewed and stable  Last Vitals:  Vitals Value Taken Time  BP 96/66 08/08/23 1048  Temp    Pulse 88 08/08/23 1052  Resp 17 08/08/23 1052  SpO2 95 % 08/08/23 1052  Vitals shown include unfiled device data.  Last Pain:  Vitals:   08/08/23 0804  PainSc: 0-No pain      Patients Stated Pain Goal: 3 (08/08/23 0804)  Complications: No notable events documented.

## 2023-08-08 NOTE — Discharge Instructions (Addendum)
 No Tylenol until after 2pm today if needed  Post Anesthesia Home Care Instructions  Activity: Get plenty of rest for the remainder of the day. A responsible individual must stay with you for 24 hours following the procedure.  For the next 24 hours, DO NOT: -Drive a car -Advertising copywriter -Drink alcoholic beverages -Take any medication unless instructed by your physician -Make any legal decisions or sign important papers.  Meals: Start with liquid foods such as gelatin or soup. Progress to regular foods as tolerated. Avoid greasy, spicy, heavy foods. If nausea and/or vomiting occur, drink only clear liquids until the nausea and/or vomiting subsides. Call your physician if vomiting continues.  Special Instructions/Symptoms: Your throat may feel dry or sore from the anesthesia or the breathing tube placed in your throat during surgery. If this causes discomfort, gargle with warm salt water. The discomfort should disappear within 24 hours.  If you had a scopolamine patch placed behind your ear for the management of post- operative nausea and/or vomiting:  1. The medication in the patch is effective for 72 hours, after which it should be removed.  Wrap patch in a tissue and discard in the trash. Wash hands thoroughly with soap and water. 2. You may remove the patch earlier than 72 hours if you experience unpleasant side effects which may include dry mouth, dizziness or visual disturbances. 3. Avoid touching the patch. Wash your hands with soap and water after contact with the patch.    Regional Anesthesia Blocks  1. You may not be able to move or feel the "blocked" extremity after a regional anesthetic block. This may last may last from 3-48 hours after placement, but it will go away. The length of time depends on the medication injected and your individual response to the medication. As the nerves start to wake up, you may experience tingling as the movement and feeling returns to your  extremity. If the numbness and inability to move your extremity has not gone away after 48 hours, please call your surgeon.   2. The extremity that is blocked will need to be protected until the numbness is gone and the strength has returned. Because you cannot feel it, you will need to take extra care to avoid injury. Because it may be weak, you may have difficulty moving it or using it. You may not know what position it is in without looking at it while the block is in effect.  3. For blocks in the legs and feet, returning to weight bearing and walking needs to be done carefully. You will need to wait until the numbness is entirely gone and the strength has returned. You should be able to move your leg and foot normally before you try and bear weight or walk. You will need someone to be with you when you first try to ensure you do not fall and possibly risk injury.  4. Bruising and tenderness at the needle site are common side effects and will resolve in a few days.  5. Persistent numbness or new problems with movement should be communicated to the surgeon or the [facility] [phone] [facility] [phone]).Hand Center Instructions Hand Surgery  Wound Care: Keep your hand elevated above the level of your heart.  Do not allow it to dangle by your side.  Keep the dressing dry and do not remove it unless your doctor advises you to do so.  He will usually change it at the time of your post-op  visit.  Moving your fingers is advised to stimulate circulation but will depend on the site of your surgery.  If you have a splint applied, your doctor will advise you regarding movement.  Activity: Do not drive or operate machinery today.  Rest today and then you may return to your normal activity and work as indicated by your physician.  Diet:  Drink liquids today or eat a light diet.  You may resume a regular diet tomorrow.    General expectations: Pain for two to three  days. Fingers may become slightly swollen.  Call your doctor if any of the following occur: Severe pain not relieved by pain medication. Elevated temperature. Dressing soaked with blood. Inability to move fingers. White or bluish color to fingers.

## 2023-08-08 NOTE — Op Note (Signed)
 I assisted Surgeons and Role:    * Betha Loa, MD - Primary    Cindee Salt, MD - Assisting on the Procedure(s): HARDWARE REMOVAL RIGHT WRIST on 08/08/2023.  I provided assistance on this case as follows: Set up, approach, opening of the joint, debridement and partial synovectomy, retraction for identification of the hardware, removal of multiple headless screws, closure of the wound and application of the dressing and splint.  Electronically signed by: Cindee Salt, MD Date: 08/08/2023 Time: 10:44 AM

## 2023-08-08 NOTE — Anesthesia Postprocedure Evaluation (Signed)
 Anesthesia Post Note  Patient: [name]  Procedure(s) Performed: HARDWARE REMOVAL RIGHT WRIST (Right: Wrist)     Patient location during evaluation: PACU Anesthesia Type: Regional Level of consciousness: awake and alert Pain management: pain level controlled Vital Signs Assessment: post-procedure vital signs reviewed and stable Respiratory status: spontaneous breathing, nonlabored ventilation and respiratory function stable Cardiovascular status: blood pressure returned to baseline Postop Assessment: no apparent nausea or vomiting Anesthetic complications: no   No notable events documented.  Last Vitals:  Vitals:   08/08/23 1127 08/08/23 1230  BP: 106/83   Pulse: 77   Resp: 15   Temp: (!) 36.2 C   SpO2: (!) 89% 95%    Last Pain:  Vitals:   08/08/23 1127  TempSrc: Oral  PainSc: 0-No pain                 Shanda Howells

## 2023-08-09 ENCOUNTER — Encounter (HOSPITAL_BASED_OUTPATIENT_CLINIC_OR_DEPARTMENT_OTHER): Payer: Self-pay | Admitting: Orthopedic Surgery

## 2023-08-09 ENCOUNTER — Ambulatory Visit: Payer: Self-pay | Admitting: Student

## 2023-08-09 ENCOUNTER — Observation Stay (HOSPITAL_COMMUNITY)
Admission: EM | Admit: 2023-08-09 | Discharge: 2023-08-11 | Disposition: A | Discharge status: Home / Self Care | Attending: Internal Medicine | Admitting: Internal Medicine

## 2023-08-09 ENCOUNTER — Other Ambulatory Visit: Payer: Self-pay

## 2023-08-09 DIAGNOSIS — M79601 Pain in right arm: Secondary | ICD-10-CM | POA: Insufficient documentation

## 2023-08-09 DIAGNOSIS — K31811 Angiodysplasia of stomach and duodenum with bleeding: Secondary | ICD-10-CM

## 2023-08-09 DIAGNOSIS — F411 Generalized anxiety disorder: Secondary | ICD-10-CM | POA: Diagnosis present

## 2023-08-09 DIAGNOSIS — K922 Gastrointestinal hemorrhage, unspecified: Secondary | ICD-10-CM | POA: Diagnosis not present

## 2023-08-09 DIAGNOSIS — F1490 Cocaine use, unspecified, uncomplicated: Secondary | ICD-10-CM

## 2023-08-09 DIAGNOSIS — F419 Anxiety disorder, unspecified: Secondary | ICD-10-CM | POA: Insufficient documentation

## 2023-08-09 DIAGNOSIS — K921 Melena: Secondary | ICD-10-CM | POA: Diagnosis not present

## 2023-08-09 DIAGNOSIS — K625 Hemorrhage of anus and rectum: Secondary | ICD-10-CM | POA: Diagnosis present

## 2023-08-09 DIAGNOSIS — E1122 Type 2 diabetes mellitus with diabetic chronic kidney disease: Secondary | ICD-10-CM | POA: Insufficient documentation

## 2023-08-09 DIAGNOSIS — K219 Gastro-esophageal reflux disease without esophagitis: Secondary | ICD-10-CM | POA: Diagnosis not present

## 2023-08-09 DIAGNOSIS — N1832 Chronic kidney disease, stage 3b: Secondary | ICD-10-CM | POA: Diagnosis not present

## 2023-08-09 DIAGNOSIS — K449 Diaphragmatic hernia without obstruction or gangrene: Secondary | ICD-10-CM | POA: Diagnosis not present

## 2023-08-09 DIAGNOSIS — Z8673 Personal history of transient ischemic attack (TIA), and cerebral infarction without residual deficits: Secondary | ICD-10-CM | POA: Insufficient documentation

## 2023-08-09 DIAGNOSIS — Z7985 Long-term (current) use of injectable non-insulin antidiabetic drugs: Secondary | ICD-10-CM | POA: Diagnosis not present

## 2023-08-09 DIAGNOSIS — K31819 Angiodysplasia of stomach and duodenum without bleeding: Secondary | ICD-10-CM | POA: Diagnosis not present

## 2023-08-09 DIAGNOSIS — E1140 Type 2 diabetes mellitus with diabetic neuropathy, unspecified: Secondary | ICD-10-CM

## 2023-08-09 DIAGNOSIS — I129 Hypertensive chronic kidney disease with stage 1 through stage 4 chronic kidney disease, or unspecified chronic kidney disease: Secondary | ICD-10-CM | POA: Diagnosis not present

## 2023-08-09 DIAGNOSIS — Z7984 Long term (current) use of oral hypoglycemic drugs: Secondary | ICD-10-CM

## 2023-08-09 DIAGNOSIS — M109 Gout, unspecified: Secondary | ICD-10-CM | POA: Diagnosis not present

## 2023-08-09 DIAGNOSIS — F17200 Nicotine dependence, unspecified, uncomplicated: Secondary | ICD-10-CM | POA: Diagnosis present

## 2023-08-09 DIAGNOSIS — E785 Hyperlipidemia, unspecified: Secondary | ICD-10-CM | POA: Diagnosis not present

## 2023-08-09 DIAGNOSIS — Z79899 Other long term (current) drug therapy: Secondary | ICD-10-CM | POA: Insufficient documentation

## 2023-08-09 DIAGNOSIS — Z794 Long term (current) use of insulin: Secondary | ICD-10-CM

## 2023-08-09 DIAGNOSIS — D649 Anemia, unspecified: Secondary | ICD-10-CM | POA: Diagnosis not present

## 2023-08-09 DIAGNOSIS — I10 Essential (primary) hypertension: Secondary | ICD-10-CM | POA: Diagnosis present

## 2023-08-09 LAB — GLUCOSE, CAPILLARY
Glucose-Capillary: 108 mg/dL — ABNORMAL HIGH (ref 70–99)
Glucose-Capillary: 175 mg/dL — ABNORMAL HIGH (ref 70–99)

## 2023-08-09 LAB — HEMOGLOBIN AND HEMATOCRIT, BLOOD
HCT: 28.9 % — ABNORMAL LOW (ref 36.0–46.0)
Hemoglobin: 9 g/dL — ABNORMAL LOW (ref 12.0–15.0)

## 2023-08-09 LAB — COMPREHENSIVE METABOLIC PANEL
ALT: 13 U/L (ref 0–44)
AST: 26 U/L (ref 15–41)
Albumin: 3.4 g/dL — ABNORMAL LOW (ref 3.5–5.0)
Alkaline Phosphatase: 65 U/L (ref 38–126)
Anion gap: 9 (ref 5–15)
BUN: 38 mg/dL — ABNORMAL HIGH (ref 8–23)
CO2: 19 mmol/L — ABNORMAL LOW (ref 22–32)
Calcium: 8.2 mg/dL — ABNORMAL LOW (ref 8.9–10.3)
Chloride: 107 mmol/L (ref 98–111)
Creatinine, Ser: 2.1 mg/dL — ABNORMAL HIGH (ref 0.44–1.00)
GFR, Estimated: 25 mL/min — ABNORMAL LOW (ref >60)
Glucose, Bld: 119 mg/dL — ABNORMAL HIGH (ref 70–99)
Potassium: 5.1 mmol/L (ref 3.5–5.1)
Sodium: 135 mmol/L (ref 135–145)
Total Bilirubin: 0.4 mg/dL (ref 0.0–1.2)
Total Protein: 7 g/dL (ref 6.5–8.1)

## 2023-08-09 LAB — CBC
HCT: 30.4 % — ABNORMAL LOW (ref 36.0–46.0)
Hemoglobin: 9.4 g/dL — ABNORMAL LOW (ref 12.0–15.0)
MCH: 26.9 pg (ref 26.0–34.0)
MCHC: 30.9 g/dL (ref 30.0–36.0)
MCV: 87.1 fL (ref 80.0–100.0)
Platelets: 300 10*3/uL (ref 150–400)
RBC: 3.49 MIL/uL — ABNORMAL LOW (ref 3.87–5.11)
RDW: 22.1 % — ABNORMAL HIGH (ref 11.5–15.5)
WBC: 6.1 10*3/uL (ref 4.0–10.5)
nRBC: 0 % (ref 0.0–0.2)

## 2023-08-09 LAB — TYPE AND SCREEN
ABO/RH(D): AB POS
Antibody Screen: NEGATIVE

## 2023-08-09 LAB — HIV ANTIBODY (ROUTINE TESTING W REFLEX): HIV Screen 4th Generation wRfx: NONREACTIVE

## 2023-08-09 LAB — POC OCCULT BLOOD, ED: Fecal Occult Bld: POSITIVE — AB

## 2023-08-09 MED ORDER — HYDROMORPHONE HCL 1 MG/ML IJ SOLN
0.5000 mg | INTRAMUSCULAR | Status: DC | PRN
  Administered 2023-08-09 – 2023-08-10 (×2): 1 mg via INTRAVENOUS
  Filled 2023-08-09 (×2): qty 1

## 2023-08-09 MED ORDER — GABAPENTIN 400 MG PO CAPS
400.0000 mg | ORAL_CAPSULE | Freq: Every morning | ORAL | Status: DC
  Administered 2023-08-10 – 2023-08-11 (×2): 400 mg via ORAL
  Filled 2023-08-09 (×2): qty 1

## 2023-08-09 MED ORDER — GABAPENTIN 400 MG PO CAPS
400.0000 mg | ORAL_CAPSULE | Freq: Every day | ORAL | Status: DC
  Administered 2023-08-10 – 2023-08-11 (×2): 400 mg via ORAL
  Filled 2023-08-09 (×2): qty 1

## 2023-08-09 MED ORDER — INSULIN ASPART 100 UNIT/ML IJ SOLN
0.0000 [IU] | Freq: Three times a day (TID) | INTRAMUSCULAR | Status: DC
  Administered 2023-08-10: 2 [IU] via SUBCUTANEOUS
  Administered 2023-08-11: 1 [IU] via SUBCUTANEOUS

## 2023-08-09 MED ORDER — ACETAMINOPHEN 650 MG RE SUPP: 650.0000 mg | Freq: Four times a day (QID) | RECTAL | Status: DC | PRN

## 2023-08-09 MED ORDER — POLYETHYLENE GLYCOL 3350 17 G PO PACK: 17.0000 g | PACK | Freq: Every day | ORAL | Status: DC | PRN

## 2023-08-09 MED ORDER — GABAPENTIN 400 MG PO CAPS
800.0000 mg | ORAL_CAPSULE | Freq: Every day | ORAL | Status: DC
  Administered 2023-08-09 – 2023-08-10 (×2): 800 mg via ORAL
  Filled 2023-08-09 (×2): qty 2

## 2023-08-09 MED ORDER — HYDROMORPHONE HCL 1 MG/ML IJ SOLN
1.0000 mg | Freq: Once | INTRAMUSCULAR | Status: AC
  Administered 2023-08-09: 1 mg via INTRAVENOUS
  Filled 2023-08-09: qty 1

## 2023-08-09 MED ORDER — ROSUVASTATIN CALCIUM 5 MG PO TABS
10.0000 mg | ORAL_TABLET | Freq: Every morning | ORAL | Status: DC
  Administered 2023-08-10 – 2023-08-11 (×2): 10 mg via ORAL
  Filled 2023-08-09: qty 2
  Filled 2023-08-09: qty 1

## 2023-08-09 MED ORDER — HYDROMORPHONE HCL 1 MG/ML IJ SOLN
0.5000 mg | Freq: Once | INTRAMUSCULAR | Status: AC
  Administered 2023-08-09: 0.5 mg via INTRAVENOUS
  Filled 2023-08-09: qty 1

## 2023-08-09 MED ORDER — PANTOPRAZOLE SODIUM 40 MG IV SOLR
40.0000 mg | Freq: Two times a day (BID) | INTRAVENOUS | Status: DC
  Administered 2023-08-09 – 2023-08-11 (×5): 40 mg via INTRAVENOUS
  Filled 2023-08-09 (×5): qty 10

## 2023-08-09 MED ORDER — ACETAMINOPHEN 325 MG PO TABS: 650.0000 mg | ORAL_TABLET | Freq: Four times a day (QID) | ORAL | Status: DC | PRN

## 2023-08-09 MED ORDER — DAPAGLIFLOZIN PROPANEDIOL 10 MG PO TABS
10.0000 mg | ORAL_TABLET | Freq: Every morning | ORAL | Status: DC
  Administered 2023-08-11: 10 mg via ORAL
  Filled 2023-08-09 (×2): qty 1

## 2023-08-09 NOTE — Progress Notes (Signed)
 Transition of Care [facility]) - Inpatient Brief Assessment   Patient Details  Name: [name] MRN: [id number] Date of Birth: 1951/01/14  Transition of Care [facility]) CM/SW Contact:    Oletta Cohn, RN Phone Number: 08/09/2023, 2:50 PM   Clinical Narrative: EDRN reviewed chart for possible care management needs.  No needs identified or communicated.      Transition of Care Asessment: Insurance and Status: Insurance coverage has been reviewed Patient has primary care physician: Yes Home environment has been reviewed: from home alone Prior level of function:: independent Prior/Current Home Services: No current home services Social Drivers of Health Review: SDOH reviewed no interventions necessary Readmission risk has been reviewed: Yes Transition of care needs: transition of care needs identified, TOC will continue to follow

## 2023-08-09 NOTE — H&P (Signed)
 Date: 08/09/2023               Patient Name:  [name] MRN: [id number]  DOB: 13-Jun-1950 Age / Sex: 73 y.o., female   PCP: Katheran James, DO         Medical Service: Internal Medicine Teaching Service         Attending Physician: Dr. Gust Rung, DO      First Contact: Dr. Annett Fabian, MD Pager [phone]    Second Contact: Dr. Modena Slater, DO Pager [phone]         After Hours (After 5p/  First Contact Pager: [phone]  weekends / holidays): Second Contact Pager: [phone]   SUBJECTIVE   Chief Complaint: GI bleed  History of Present Illness:  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   She reports melena for last 3 bowel movements.  She is also reporting some lightheadedness and dizziness, shortness of breath for the past day or so.  She recently had a hardware removal surgery on her right wrist yesterday. She has a known history of AVMs in her duodenum with chronic iron deficiency anemia secondary to chronic GI bleeding.  She states she has mild lower abdominal pain, no dysuria or change in urinary frequency.  Denies any fevers, chills, night sweats, nausea or vomiting.  Not on any blood thinners.  She is mainly concerned about severe wrist pain at the time of our exam.  She did not participate much in the taking of our history or our physical exam, perhaps due to her pain.  ED Course:  Hemodynamically stable CBC with normocytic anemia, no leukocytosis BUN 38/creatinine 2.1  Meds:  Current Meds  Medication Sig   acetaminophen (TYLENOL) 500 MG tablet Take 1,000 mg by mouth every 6 (six) hours as needed for mild pain (pain score 1-3) or moderate pain (pain score 4-6).   allopurinol (ZYLOPRIM) 100 MG tablet Take 1/2 tablet (50 mg total) by mouth daily.  Begin  medication after one week of colchicine treatment. (Patient taking differently: Take 50 mg by mouth as needed (gout).)   amLODipine (NORVASC) 10 MG tablet Take 1 tablet (10 mg total) by mouth daily. (Patient taking differently: Take 10 mg by mouth in the morning.)   calcium-vitamin D (OSCAL WITH D) 500-200 MG-UNIT tablet Take 1 tablet by mouth 2 (two) times daily.   camphor-menthol (SARNA) lotion Apply 1 Application topically as needed for itching.   carvedilol (COREG) 3.125 MG tablet Take 1 tablet (3.125 mg total) by mouth 2 (two) times daily.   dapagliflozin propanediol (FARXIGA) 10 MG TABS tablet Take 1 tablet by mouth daily. (Patient taking differently: Take 10 mg by mouth in the morning.)   Dulaglutide (TRULICITY) 0.75 MG/0.5ML SOAJ Inject 0.75 mg into the skin once a week. (Patient taking differently: Inject 0.75 mg into the skin once a week. On Sunday)   famotidine (PEPCID) 20 MG tablet Take 1 tablet (20 mg total) by mouth 2 (two) times daily. (Patient taking differently: Take 20 mg by mouth as needed for heartburn or indigestion.)   gabapentin (NEURONTIN) 400 MG capsule Take 1 capsule (400 mg total) by mouth in the morning AND 1 capsule (400 mg total) daily in the afternoon AND 2 capsules (800 mg total) before bedtime.  HYDROcodone-acetaminophen (NORCO/VICODIN) 5-325 MG tablet Take 1-2 tablets by mouth every 6 (six) hours as needed for pain.   losartan (COZAAR) 100 MG tablet Take 1 tablet (100 mg total) by mouth daily. (Patient taking differently: Take 100 mg by mouth in the morning.)   omeprazole (PRILOSEC) 20 MG capsule Take 1 capsule (20 mg total) by mouth daily. (Patient taking differently: Take 20 mg by mouth in the morning.)   rosuvastatin (CRESTOR) 20 MG tablet Take 1 tablet (20 mg total) by mouth daily. (Patient taking differently: Take 20 mg by mouth in the morning.)    Past Medical History: Past Medical History:  Diagnosis Date   Allergic rhinitis    Anemia, iron deficiency  05/03/2011   2/2 GI AVMs - recieves iron infusions as well as periodic red blood cell transfusions   Anxiety    Arteriovenous malformation of duodenum 02/25/2015   CARPAL TUNNEL RELEASE, HX OF 07/22/2008   Chest pain 10/28/2019   Colon polyps    Diabetes mellitus without complication (HCC)    type 2   Difficulty sleeping    takes trazadone for sleep   Diverticulosis    Electrolyte abnormality 01/25/2021   Fall 01/03/2020   GERD (gastroesophageal reflux disease)    GI bleed 07/07/2020   Hepatitis C    in SVR with Harvoni 12/2014-02/2015.    History of hernia repair 08/18/2012   History of porphyria 11/30/2014   Porphyria cutanea tarda in setting of chronic Hepatitis C, frequent  IV iron infusions, and alcohol abuse    Hypertension    Hypocalcemia 10/28/2019   Hypocalcemia 01/19/2021   Hypokalemia 09/27/2014   Hypokalemia 01/19/2021   Hypomagnesemia 01/19/2021   Kidney disease 02/04/2019   Peripheral vascular disease (HCC)    Prolonged QT interval 01/19/2021   Stroke (HCC) 2010   no residual deficits   Substance use disorder    cocaine   Tachycardia 07/10/2017   Trichomonas vaginitis 04/16/2019    Past surgical history: Past Surgical History:  Procedure Laterality Date   ABDOMINAL HYSTERECTOMY     APPLICATION OF WOUND VAC N/A 06/21/2014   Procedure: APPLICATION OF WOUND VAC;  Surgeon: Abigail Miyamoto, MD;  Location: [facility];  Service: General;  Laterality: N/A;   CARPAL TUNNEL RELEASE     rt hand   COLONOSCOPY WITH PROPOFOL N/A 03/17/2018   Procedure: COLONOSCOPY WITH PROPOFOL;  Surgeon: Sherrilyn Rist, MD;  Location: [facility];  Service: Gastroenterology;  Laterality: N/A;   ENTEROSCOPY N/A 12/04/2012   Procedure: ENTEROSCOPY;  Surgeon: Theda Belfast, MD;  Location: [facility];  Service: Endoscopy;  Laterality: N/A;   ENTEROSCOPY N/A 12/18/2012   Procedure: ENTEROSCOPY;  Surgeon: Theda Belfast, MD;  Location: [facility];  Service: Endoscopy;  Laterality: N/A;   ENTEROSCOPY N/A  02/25/2015   Procedure: ENTEROSCOPY;  Surgeon: Louis Meckel, MD;  Location: [facility];  Service: Endoscopy;  Laterality: N/A;   ENTEROSCOPY N/A 10/29/2017   Procedure: ENTEROSCOPY;  Surgeon: Sherrilyn Rist, MD;  Location: [facility];  Service: Gastroenterology;  Laterality: N/A;   ENTEROSCOPY N/A 07/08/2020   Procedure: ENTEROSCOPY;  Surgeon: Rachael Fee, MD;  Location: [facility];  Service: Endoscopy;  Laterality: N/A;   ESOPHAGOGASTRODUODENOSCOPY  05/05/2011   Procedure: ESOPHAGOGASTRODUODENOSCOPY (EGD);  Surgeon: Erick Blinks, MD;  Location: [facility];  Service: Gastroenterology;  Laterality: N/A;  Dr. Rhea Belton will do procedure for Dr. [name] Saturday.   ESOPHAGOGASTRODUODENOSCOPY  05/08/2011   Procedure: ESOPHAGOGASTRODUODENOSCOPY (EGD);  Surgeon: Theda Belfast;  Location: [facility];  Service: Endoscopy;  Laterality: N/A;   ESOPHAGOGASTRODUODENOSCOPY  06/07/2011   Procedure: ESOPHAGOGASTRODUODENOSCOPY (EGD);  Surgeon: Theda Belfast, MD;  Location: [facility];  Service: Endoscopy;  Laterality: N/A;   ESOPHAGOGASTRODUODENOSCOPY  12/20/2011   Procedure: ESOPHAGOGASTRODUODENOSCOPY (EGD);  Surgeon: Theda Belfast, MD;  Location: [facility];  Service: Endoscopy;  Laterality: N/A;   EYE SURGERY Bilateral    cataracts   FLEXIBLE SIGMOIDOSCOPY  12/21/2011   Procedure: FLEXIBLE SIGMOIDOSCOPY;  Surgeon: Theda Belfast, MD;  Location: [facility];  Service: Endoscopy;  Laterality: N/A;   HARDWARE REMOVAL Right 08/08/2023   Procedure: HARDWARE REMOVAL RIGHT WRIST;  Surgeon: Betha Loa, MD;  Location: [facility];  Service: Orthopedics;  Laterality: Right;  60 MIN   HOT HEMOSTASIS  06/07/2011   Procedure: HOT HEMOSTASIS (ARGON PLASMA COAGULATION/BICAP);  Surgeon: Theda Belfast, MD;  Location: [facility];  Service: Endoscopy;  Laterality: N/A;   HOT HEMOSTASIS N/A 07/08/2020   Procedure: HOT HEMOSTASIS (ARGON PLASMA COAGULATION/BICAP);  Surgeon: Rachael Fee, MD;   Location: [facility];  Service: Endoscopy;  Laterality: N/A;   INCISIONAL HERNIA REPAIR N/A 06/01/2014   Procedure: ATTEMPTED LAPAROSCOPIC AND OPEN INCISIONAL HERNIA REPAIR WITH MESH;  Surgeon: Abigail Miyamoto, MD;  Location: [facility] OR;  Service: General;  Laterality: N/A;   INSERTION OF MESH N/A 06/01/2014   Procedure: INSERTION OF MESH;  Surgeon: Abigail Miyamoto, MD;  Location: [facility];  Service: General;  Laterality: N/A;   LAPAROTOMY  02/16/2012   Procedure: EXPLORATORY LAPAROTOMY;  Surgeon: Mariella Saa, MD;  Location: [facility];  Service: General;  Laterality: N/A;  oversewing of anastomotic leak and rigid sigmoidoscopy   LAPAROTOMY N/A 06/21/2014   Procedure: ABDOMINAL WOUND EXPLORATION;  Surgeon: Abigail Miyamoto, MD;  Location: [facility];  Service: General;  Laterality: N/A;   LEG AMPUTATION ABOVE KNEE     left   PARTIAL COLECTOMY  02/15/2012   Procedure: PARTIAL COLECTOMY;  Surgeon: Shelly Rubenstein, MD;  Location: [facility];  Service: General;  Laterality: N/A;   SUBMUCOSAL INJECTION  07/08/2020   Procedure: SUBMUCOSAL INJECTION;  Surgeon: Rachael Fee, MD;  Location: [facility];  Service: Endoscopy;;   TONSILLECTOMY     WRIST FUSION Right 04/27/2021   Procedure: SCAPHOID EXCISION  WITH 4 BONE FUSION RIGHT WRIST;  Surgeon: Betha Loa, MD;  Location: [facility];  Service: Orthopedics;  Laterality: Right;    Social:  Lives With: her friend Occupation: none Support: independent Level of Function: independent PCP: Katheran James, DO Substances: History of polysubstance use. Denies recent use.   Family History:  Family History  Problem Relation Age of Onset   Cancer Father        unknown   Hypertension Mother    Diverticulitis Mother     Allergies: Allergies as of 08/09/2023 - Review Complete 08/09/2023  Allergen Reaction Noted   Ciprofloxacin Hives and Swelling 12/25/2011   Morphine and codeine Other (See Comments) 12/25/2011   Penicillins Hives and  Swelling    Codeine Other (See Comments) 11/21/2021   Lisinopril Cough 01/04/2016   Feraheme [ferumoxytol] Itching 11/17/2012   Review of Systems: A complete ROS was negative except as per HPI.   OBJECTIVE:   Physical Exam: Blood pressure (!) 141/81, pulse 79, temperature 98.2 F (36.8 C), temperature source Oral, resp. rate 18, SpO2 97%.  Lying in bed on her side, in discomfort Physical exam limited by patient participation Heart rate and rhythm regular, no murmurs appreciated  Lungs clear to auscultation bilaterally, normal respiratory rate and effort Abdomen soft, non-tender, non-distended Right wrist in cast  Labs:  CBC    Component Value Date/Time   WBC 6.1 08/09/2023 1230   RBC 3.49 (L) 08/09/2023 1230   HGB 9.0 (L) 08/09/2023 1619   HGB 9.0 (L) 07/04/2023 0841   HGB 11.3 03/13/2023 0958   HGB 11.3 (L) 01/31/2017 1416   HCT 28.9 (L) 08/09/2023 1619   HCT 35.3 03/13/2023 0958   HCT 33.9 (L) 01/31/2017 1416   PLT 300 08/09/2023 1230   PLT 374 07/04/2023 0841   PLT 244 03/13/2023 0958   MCV 87.1 08/09/2023 1230   MCV 98 (H) 03/13/2023 0958   MCV 97.3 01/31/2017 1416   MCH 26.9 08/09/2023 1230   MCHC 30.9 08/09/2023 1230   RDW 22.1 (H) 08/09/2023 1230   RDW 13.7 03/13/2023 0958   RDW 15.2 (H) 01/31/2017 1416   LYMPHSABS 1.2 07/04/2023 0841   LYMPHSABS 0.9 03/13/2023 0958   LYMPHSABS 1.8 01/31/2017 1416   MONOABS 0.8 07/04/2023 0841   MONOABS 0.9 01/31/2017 1416   EOSABS 0.2 07/04/2023 0841   EOSABS 0.2 03/13/2023 0958   BASOSABS 0.0 07/04/2023 0841   BASOSABS 0.1 03/13/2023 0958   BASOSABS 0.0 01/31/2017 1416    CMP:    Component Value Date/Time   NA 135 08/09/2023 1230   NA 141 07/31/2022 0940   NA 141 01/31/2017 1416   K 5.1 08/09/2023 1230   K 4.4 01/31/2017 1416   CL 107 08/09/2023 1230   CL 104 10/03/2012 1048   CO2 19 (L) 08/09/2023 1230   CO2 22 01/31/2017 1416   GLUCOSE 119 (H) 08/09/2023 1230   GLUCOSE 104 01/31/2017 1416   GLUCOSE 154  (H) 10/03/2012 1048   BUN 38 (H) 08/09/2023 1230   BUN 28 (H) 07/31/2022 0940   BUN 14.0 01/31/2017 1416   CREATININE 2.10 (H) 08/09/2023 1230   CREATININE 1.92 (H) 07/04/2023 0841   CREATININE 0.9 01/31/2017 1416   CALCIUM 8.2 (L) 08/09/2023 1230   CALCIUM 6.6 (LL) 01/19/2021 0906   CALCIUM 9.0 01/31/2017 1416   PROT 7.0 08/09/2023 1230   PROT 6.8 07/31/2022 0940   PROT 7.1 01/31/2017 1416   ALBUMIN 3.4 (L) 08/09/2023 1230   ALBUMIN 4.1 07/31/2022 0940   ALBUMIN 3.4 (L) 01/31/2017 1416   AST 26 08/09/2023 1230   AST 16 07/04/2023 0841   AST 25 01/31/2017 1416   ALT 13 08/09/2023 1230   ALT 7 07/04/2023 0841   ALT 21 01/31/2017 1416   ALKPHOS 65 08/09/2023 1230   ALKPHOS 71 01/31/2017 1416   BILITOT 0.4 08/09/2023 1230   BILITOT 0.2 07/04/2023 0841   BILITOT 0.26 01/31/2017 1416   GFRNONAA 25 (L) 08/09/2023 1230   GFRNONAA 28 (L) 07/04/2023 0841   GFRNONAA >89 11/30/2014 1608   GFRAA 53 (L) 02/25/2020 1021   GFRAA >89 11/30/2014 1608    ASSESSMENT & PLAN:  Assessment & Plan by Problem: Active Problems:   Essential hypertension, benign   GERD   Generalized anxiety disorder   Tobacco use disorder   Hyperlipidemia   Stage 3b chronic kidney disease (CKD) (HCC)   GI bleed  NARELLE SCHOENING is a 73 y.o. person living with a history of recurrent GI bleeding 2/2 duodenal AVMs and chronic iron deficiency anemia 2/2 chronic GI blood loss who presented with melena and admitted for acute on chronic GI bleed on hospital day 0  GI bleed History of duodenal  AVM Chronic iron deficiency anemia Hemodynamically and clinically stable.  GI consulted in emergency department, came to evaluate the patient.  Given her history of recurrent GI bleeds, suspect this is rebleeding from her known small bowel AVM. Recent iron studies with ferritin of 5, likely secondary to chronic GI blood loss. Previously followed with hematology for IV iron infusion.  - GI following, appreciate assistance;  scheduled for enteroscopy tomorrow - Trending H/H, q6h - IV Protonix - Clear liquids, n.p.o. at midnight  Right arm pain S/p hardware removal surgery Had hardware removal surgery yesterday. Right arm is in a cast. She has a lot of pain when she moves it and is requesting pain medication. Gave her 1 mg Dilaudid and added prns.   Chronic conditions: HTN: Normotensive. Held home BP meds (losartan, carvedilol, amlodipine).  Prediabetes: A1c 5.5 one month ago. Restarted Farxiga 10 mg, gabapentin. SSI. CBG monitoring. Hx Prolonged QT interval: Qtc 423 on 3/12.  Tobacco use: 8.5 pack per day smoking history.  Polysubstance use: Cocaine, marijuana, alcohol use.  GERD: Famotidine, omeprazole, held. Gout: held allopurinol.  Hyperlipidemia: LDL at goal one month ago.  CKD stage 3b: Cr 2.10, GFR 25.   Diet: Clear liquids; NPO at midnight VTE: SCDs IVF: None Code: Full  Prior to Admission Living Arrangement: Home, living with friend Anticipated Discharge Location: Home Barriers to Discharge: None  Dispo: Admit patient to Observation with expected length of stay less than 2 midnights.  Signed: Annett Fabian, MD Internal Medicine Resident, PGY-1 [facility]  Pager: [phone]  08/09/2023, 5:06 PM

## 2023-08-09 NOTE — ED Triage Notes (Signed)
 Pt BIB by [facility] for blood in stools x 24 hours. Hx of anemia and past GU bleed. Recent right wrist surgery to have hardware removed. EMS reports intermittent oxygen saturation 90-91% RA, placed on 2 L via [place]. Not on home oxygen at baseline.

## 2023-08-09 NOTE — Telephone Encounter (Signed)
 Per chart, pt is at the ER.

## 2023-08-09 NOTE — Telephone Encounter (Signed)
 Copied from CRM [id number]. Topic: Clinical - Red Word Triage >> Aug 09, 2023 10:59 AM Dondra Prader A wrote: Red Word that prompted transfer to Nurse Triage: Patient called in wanting to know who he GI doctor is.  Symptoms: bleeding from rectum real bad, patient is grunting in pain on the phone.  Chief Complaint: rectal bleeding x 3 very large amounts  Symptoms: weakness, mod to severe abdominal pain, lightheaded Frequency: yesterday Disposition: [x] ED /[] Urgent Care (no appt availability in office) / [] Appointment(In office/virtual)/ []  [facility]/ [] Home Care/ [] Refused Recommended Disposition /[] [facility]/ []  Follow-up with PCP Additional Notes: called 911 for pt.   Reason for Disposition  SEVERE rectal bleeding (large blood clots; constant or on and off bleeding)  Answer Assessment - Initial Assessment Questions 1. APPEARANCE of BLOOD: "What color is it?" "Is it passed separately, on the surface of the stool, or mixed in with the stool?"      Black- separate 2. AMOUNT: "How much blood was passed?"      Very large amount blood in water  3. FREQUENCY: "How many times has blood been passed with the stools?"      3 times  4. ONSET: "When was the blood first seen in the stools?" (Days or weeks)      Yesterday  7. RECURRENT SYMPTOMS: "Have you had blood in your stools before?" If Yes, ask: "When was the last time?" and "What happened that time?"      yes 8. BLOOD THINNERS: "Do you take any blood thinners?" (e.g., Coumadin/warfarin, Pradaxa/dabigatran, aspirin)     No  9. OTHER SYMPTOMS: "Do you have any other symptoms?"  (e.g., abdomen pain, vomiting, dizziness, fever)     Abd pain, weakness  Protocols used: Rectal Bleeding-A-AH

## 2023-08-09 NOTE — Hospital Course (Addendum)
 [name] is a 73 y.o. female with a history of duodenal AVMs, hypertension, tobacco use disorder, CKD stage IIIb who presents to the emergency department with concerns of GI bleed.  Patient admitted for further evaluation and management   #Upper GI bleed #History of duodenal AVM #Chronic iron deficiency anemia Patient initially presented to the emergency department with concerns of melanotic stools.  Patient was found to have hemoglobin of 9.4.  It did decline down to 8.5 during hospital course.  Patient remained hemodynamically stable.  Patient had enteroscopy which did show multiple areas which likely were previously bleeding.  During enteroscopy, patient had intervention with coagulation.  Hemoglobin did stabilize.  She remained hemodynamically stable.  During hospital course, patient transition to Protonix p.o.  GI signed off.  Patient tolerated diet well prior to discharge.    #Right arm pain Patient continuously had right arm pain during hospitalization.  No signs or symptoms of infection.  Pain controlled with Dilaudid initially before enteroscopy.  After enteroscopy, patient will transition to p.o. oxycodone which patient tolerated well.  Per charting, patient does have hydrocodone at home which she can resume at home.  Patient encouraged to continue to take Tylenol at home.  Patient worked with PT/OT and did well.    Chronic conditions:   #Hypertension Patient remained normotensive during hospitalization.  Can resume home losartan, carvedilol, amlodipine at discharge.   #Prediabetes Patient continued Marcelline Deist during hospitalization.  Can continue at home.  #Tobacco use disorder During hospitalization, encourage patient to quit tobacco use.   #GERD Patient had enteroscopy during hospitalization.  Continued IV PPI during hospitalization.  Can resume PPI at home.   #CKD stage IIIb Creatinine remained at baseline during hospitalization.  #Gout Held allopurinol during  hospitalization.  Can resume at home.

## 2023-08-09 NOTE — Consult Note (Signed)
 Consultation  Referring Provider: ERMD/Trifan Primary Care Physician:  Katheran James, DO Primary Gastroenterologist:  Dr.Danis  Reason for Consultation:  melena, anemia  HPI: [name] is a 73 y.o. female known to Dr. Myrtie Neither, with history of recurrent GI bleeding secondary to small bowel AVMs, and history of iron deficiency anemia.  She also has history of diabetes mellitus, chronic kidney disease, hepatitis C status posttreatment with Harvoni, hypertension, and peripheral vascular disease status post LBKA.  Also with history of prolonged QT.  She is status post partial colectomy 2013 for diverticular disease. Patient was last seen in 2022 with GI bleeding and had an enteroscopy at that time with finding of 1 medium sized AVM in the proximal jejunum which was treated with APC, tattoo was placed at the distal extent of the exam which was about 5 cm distal to the AVM.  She last had a Colonoscopy in October 2019 with finding of end-to-side colocolonic anastomosis in the rectosigmoid colon and otherwise negative exam. She has had several endoscopies and enteroscopy's prior to the exam in 2022, with treatment of small bowel AVMs. She is not currently on any aspirin or NSAIDs, then no anticoagulation or antiplatelet therapy. She relates that she has been having a lot of pain in her right wrist from previously placed screws in her wrist which had migrated and had become painful.  She actually had the screws removed yesterday by her orthopedist.  She denies taking any aspirin or NSAIDs recently for this pain says she has only been using Tylenol. She had onset yesterday morning of a black stool which she says was very tarry and muddy and smelled like blood.  She had another episode later in the day yesterday and then an episode this morning at which time she decided to come to the ER. She has not had any nausea or vomiting, no abdominal pain, and says her bowels had been looking normal prior to  yesterday.  Labs in the ER today with WBC 6.1/hemoglobin 9.4/hematocrit 30.4/platelets 300 BUN 38/creatinine 2.10/albumin 3.4 LFTs within normal limits  Labs from 1 month ago showed hemoglobin of 9.0 and 3 months ago hemoglobin was 11.9.  She is hemodynamically stable.   Past Medical History:  Diagnosis Date   Allergic rhinitis    Anemia, iron deficiency 05/03/2011   2/2 GI AVMs - recieves iron infusions as well as periodic red blood cell transfusions   Anxiety    Arteriovenous malformation of duodenum 02/25/2015   CARPAL TUNNEL RELEASE, HX OF 07/22/2008   Chest pain 10/28/2019   Colon polyps    Diabetes mellitus without complication (HCC)    type 2   Difficulty sleeping    takes trazadone for sleep   Diverticulosis    Electrolyte abnormality 01/25/2021   Fall 01/03/2020   GERD (gastroesophageal reflux disease)    GI bleed 07/07/2020   Hepatitis C    in SVR with Harvoni 12/2014-02/2015.    History of hernia repair 08/18/2012   History of porphyria 11/30/2014   Porphyria cutanea tarda in setting of chronic Hepatitis C, frequent  IV iron infusions, and alcohol abuse    Hypertension    Hypocalcemia 10/28/2019   Hypocalcemia 01/19/2021   Hypokalemia 09/27/2014   Hypokalemia 01/19/2021   Hypomagnesemia 01/19/2021   Kidney disease 02/04/2019   Peripheral vascular disease (HCC)    Prolonged QT interval 01/19/2021   Stroke (HCC) 2010   no residual deficits   Substance use disorder    cocaine   Tachycardia  07/10/2017   Trichomonas vaginitis 04/16/2019    Past Surgical History:  Procedure Laterality Date   ABDOMINAL HYSTERECTOMY     APPLICATION OF WOUND VAC N/A 06/21/2014   Procedure: APPLICATION OF WOUND VAC;  Surgeon: Abigail Miyamoto, MD;  Location: [facility];  Service: General;  Laterality: N/A;   CARPAL TUNNEL RELEASE     rt hand   COLONOSCOPY WITH PROPOFOL N/A 03/17/2018   Procedure: COLONOSCOPY WITH PROPOFOL;  Surgeon: Sherrilyn Rist, MD;  Location: [facility];  Service:  Gastroenterology;  Laterality: N/A;   ENTEROSCOPY N/A 12/04/2012   Procedure: ENTEROSCOPY;  Surgeon: Theda Belfast, MD;  Location: [facility];  Service: Endoscopy;  Laterality: N/A;   ENTEROSCOPY N/A 12/18/2012   Procedure: ENTEROSCOPY;  Surgeon: Theda Belfast, MD;  Location: [facility];  Service: Endoscopy;  Laterality: N/A;   ENTEROSCOPY N/A 02/25/2015   Procedure: ENTEROSCOPY;  Surgeon: Louis Meckel, MD;  Location: [facility];  Service: Endoscopy;  Laterality: N/A;   ENTEROSCOPY N/A 10/29/2017   Procedure: ENTEROSCOPY;  Surgeon: Sherrilyn Rist, MD;  Location: [facility];  Service: Gastroenterology;  Laterality: N/A;   ENTEROSCOPY N/A 07/08/2020   Procedure: ENTEROSCOPY;  Surgeon: Rachael Fee, MD;  Location: [facility];  Service: Endoscopy;  Laterality: N/A;   ESOPHAGOGASTRODUODENOSCOPY  05/05/2011   Procedure: ESOPHAGOGASTRODUODENOSCOPY (EGD);  Surgeon: Erick Blinks, MD;  Location: [facility];  Service: Gastroenterology;  Laterality: N/A;  Dr. Rhea Belton will do procedure for Dr. [name] Saturday.   ESOPHAGOGASTRODUODENOSCOPY  05/08/2011   Procedure: ESOPHAGOGASTRODUODENOSCOPY (EGD);  Surgeon: Theda Belfast;  Location: [facility];  Service: Endoscopy;  Laterality: N/A;   ESOPHAGOGASTRODUODENOSCOPY  06/07/2011   Procedure: ESOPHAGOGASTRODUODENOSCOPY (EGD);  Surgeon: Theda Belfast, MD;  Location: [facility];  Service: Endoscopy;  Laterality: N/A;   ESOPHAGOGASTRODUODENOSCOPY  12/20/2011   Procedure: ESOPHAGOGASTRODUODENOSCOPY (EGD);  Surgeon: Theda Belfast, MD;  Location: [facility];  Service: Endoscopy;  Laterality: N/A;   EYE SURGERY Bilateral    cataracts   FLEXIBLE SIGMOIDOSCOPY  12/21/2011   Procedure: FLEXIBLE SIGMOIDOSCOPY;  Surgeon: Theda Belfast, MD;  Location: [facility];  Service: Endoscopy;  Laterality: N/A;   HARDWARE REMOVAL Right 08/08/2023   Procedure: HARDWARE REMOVAL RIGHT WRIST;  Surgeon: Betha Loa, MD;  Location: [facility];  Service:  Orthopedics;  Laterality: Right;  60 MIN   HOT HEMOSTASIS  06/07/2011   Procedure: HOT HEMOSTASIS (ARGON PLASMA COAGULATION/BICAP);  Surgeon: Theda Belfast, MD;  Location: [facility];  Service: Endoscopy;  Laterality: N/A;   HOT HEMOSTASIS N/A 07/08/2020   Procedure: HOT HEMOSTASIS (ARGON PLASMA COAGULATION/BICAP);  Surgeon: Rachael Fee, MD;  Location: [facility];  Service: Endoscopy;  Laterality: N/A;   INCISIONAL HERNIA REPAIR N/A 06/01/2014   Procedure: ATTEMPTED LAPAROSCOPIC AND OPEN INCISIONAL HERNIA REPAIR WITH MESH;  Surgeon: Abigail Miyamoto, MD;  Location: [facility] OR;  Service: General;  Laterality: N/A;   INSERTION OF MESH N/A 06/01/2014   Procedure: INSERTION OF MESH;  Surgeon: Abigail Miyamoto, MD;  Location: [facility];  Service: General;  Laterality: N/A;   LAPAROTOMY  02/16/2012   Procedure: EXPLORATORY LAPAROTOMY;  Surgeon: Mariella Saa, MD;  Location: [facility];  Service: General;  Laterality: N/A;  oversewing of anastomotic leak and rigid sigmoidoscopy   LAPAROTOMY N/A 06/21/2014   Procedure: ABDOMINAL WOUND EXPLORATION;  Surgeon: Abigail Miyamoto, MD;  Location: [facility];  Service: General;  Laterality: N/A;   LEG AMPUTATION ABOVE KNEE     left   PARTIAL COLECTOMY  02/15/2012   Procedure: PARTIAL COLECTOMY;  Surgeon: Shelly Rubenstein, MD;  Location: [facility];  Service: General;  Laterality: N/A;   SUBMUCOSAL INJECTION  07/08/2020   Procedure: SUBMUCOSAL INJECTION;  Surgeon: Rachael Fee, MD;  Location: [facility];  Service: Endoscopy;;   TONSILLECTOMY     WRIST FUSION Right 04/27/2021   Procedure: SCAPHOID EXCISION  WITH 4 BONE FUSION RIGHT WRIST;  Surgeon: Betha Loa, MD;  Location: [facility];  Service: Orthopedics;  Laterality: Right;    Prior to Admission medications   Medication Sig Start Date End Date Taking? Authorizing Provider  ACCU-CHEK FASTCLIX LANCETS MISC Use to test blood glucose 3 times daily. 10/04/16   Nyra Market, MD  allopurinol  (ZYLOPRIM) 100 MG tablet Take 1/2 tablet (50 mg total) by mouth daily.  Begin medication after one week of colchicine treatment. 06/13/23   Faith Rogue, DO  amLODipine (NORVASC) 10 MG tablet Take 1 tablet (10 mg total) by mouth daily. 03/12/23   Katheran James, DO  Blood Glucose Monitoring Suppl (ACCU-CHEK GUIDE) w/Device KIT 1 each by Does not apply route 3 (three) times daily. The patient is insulin requiring, ICD 10 code E11.65. The patient tests 3 times a day. 02/05/19   Verdene Lennert, MD  calcium-vitamin D (OSCAL WITH D) 500-200 MG-UNIT tablet Take 1 tablet by mouth 2 (two) times daily. 01/21/21   Glade Lloyd, MD  camphor-menthol [facility]) lotion Apply 1 Application topically as needed for itching. 07/31/22   Doran Stabler, DO  carvedilol (COREG) 3.125 MG tablet Take 1 tablet (3.125 mg total) by mouth 2 (two) times daily. Patient not taking: Reported on 08/02/2023 06/13/23 06/12/24  Faith Rogue, DO  dapagliflozin propanediol (FARXIGA) 10 MG TABS tablet Take 1 tablet by mouth daily. 12/14/22     Dulaglutide (TRULICITY) 0.75 MG/0.5ML SOAJ Inject 0.75 mg into the skin once a week. 12/11/22 12/11/23  Katheran James, DO  famotidine (PEPCID) 20 MG tablet Take 1 tablet (20 mg total) by mouth 2 (two) times daily. 11/01/22   Lyndle Herrlich, MD  folic acid (FOLVITE) 1 MG tablet Take 1 tablet (1 mg total) by mouth daily. 09/03/22   Lyndle Herrlich, MD  gabapentin (NEURONTIN) 400 MG capsule Take 1 capsule (400 mg total) by mouth in the morning AND 1 capsule (400 mg total) daily in the afternoon AND 2 capsules (800 mg total) before bedtime. 05/20/23   Atway, Rayann N, DO  glucose blood (ACCU-CHEK GUIDE) test strip Use as instructed 08/23/21   Verdene Lennert, MD  HYDROcodone-acetaminophen (NORCO/VICODIN) 5-325 MG tablet Take 1-2 tablets by mouth every 6 (six) hours as needed for pain. 08/08/23   Betha Loa, MD  losartan (COZAAR) 100 MG tablet Take 1 tablet (100 mg total) by mouth daily. 06/13/23    Faith Rogue, DO  omeprazole (PRILOSEC) 20 MG capsule Take 1 capsule (20 mg total) by mouth daily. 08/07/23   Katheran James, DO  rosuvastatin (CRESTOR) 20 MG tablet Take 1 tablet (20 mg total) by mouth daily. 05/20/23   Katheran James, DO    No current facility-administered medications for this encounter.   Current Outpatient Medications  Medication Sig Dispense Refill   ACCU-CHEK FASTCLIX LANCETS MISC Use to test blood glucose 3 times daily. 100 each 11   allopurinol (ZYLOPRIM) 100 MG tablet Take 1/2 tablet (50 mg total) by mouth daily.  Begin medication after one week of colchicine treatment. 30 tablet 2   amLODipine (NORVASC) 10 MG tablet Take 1 tablet (10 mg total) by  mouth daily. 90 tablet 1   Blood Glucose Monitoring Suppl (ACCU-CHEK GUIDE) w/Device KIT 1 each by Does not apply route 3 (three) times daily. The patient is insulin requiring, ICD 10 code E11.65. The patient tests 3 times a day. 1 kit 1   calcium-vitamin D (OSCAL WITH D) 500-200 MG-UNIT tablet Take 1 tablet by mouth 2 (two) times daily. 60 tablet 0   camphor-menthol (SARNA) lotion Apply 1 Application topically as needed for itching. 222 mL 1   carvedilol (COREG) 3.125 MG tablet Take 1 tablet (3.125 mg total) by mouth 2 (two) times daily. (Patient not taking: Reported on 08/02/2023) 60 tablet 11   dapagliflozin propanediol (FARXIGA) 10 MG TABS tablet Take 1 tablet by mouth daily. 30 tablet 12   Dulaglutide (TRULICITY) 0.75 MG/0.5ML SOAJ Inject 0.75 mg into the skin once a week. 6 mL 3   famotidine (PEPCID) 20 MG tablet Take 1 tablet (20 mg total) by mouth 2 (two) times daily. 60 tablet 1   folic acid (FOLVITE) 1 MG tablet Take 1 tablet (1 mg total) by mouth daily. 30 tablet 2   gabapentin (NEURONTIN) 400 MG capsule Take 1 capsule (400 mg total) by mouth in the morning AND 1 capsule (400 mg total) daily in the afternoon AND 2 capsules (800 mg total) before bedtime. 360 capsule 3   glucose blood (ACCU-CHEK GUIDE) test  strip Use as instructed 100 strip 3   HYDROcodone-acetaminophen (NORCO/VICODIN) 5-325 MG tablet Take 1-2 tablets by mouth every 6 (six) hours as needed for pain. 20 tablet 0   losartan (COZAAR) 100 MG tablet Take 1 tablet (100 mg total) by mouth daily. 90 tablet 1   omeprazole (PRILOSEC) 20 MG capsule Take 1 capsule (20 mg total) by mouth daily. 30 capsule 2   rosuvastatin (CRESTOR) 20 MG tablet Take 1 tablet (20 mg total) by mouth daily. 90 tablet 2    Allergies as of 08/09/2023 - Review Complete 08/09/2023  Allergen Reaction Noted   Ciprofloxacin Hives and Swelling 12/25/2011   Morphine and codeine Other (See Comments) 12/25/2011   Penicillins Hives and Swelling    Codeine Other (See Comments) 11/21/2021   Lisinopril Cough 01/04/2016   Feraheme [ferumoxytol] Itching 11/17/2012    Family History  Problem Relation Age of Onset   Cancer Father        unknown   Hypertension Mother    Diverticulitis Mother     Social History   Socioeconomic History   Marital status: Widowed    Spouse name: Not on file   Number of children: 3   Years of education: Not on file   Highest education level: Not on file  Occupational History   Occupation: Retired  Tobacco Use   Smoking status: Some Days    Current packs/day: 0.50    Average packs/day: 0.5 packs/day for 17.0 years (8.5 ttl pk-yrs)    Types: Cigarettes   Smokeless tobacco: Never   Tobacco comments:    1 pack last 1 month  Vaping Use   Vaping status: Never Used  Substance and Sexual Activity   Alcohol use: Yes    Comment: few drinks a week   Drug use: Yes    Types: Cocaine, Marijuana    Comment: last used about 2 weeks cocaine and marijuana   Sexual activity: Yes    Partners: Male    Birth control/protection: Post-menopausal  Other Topics Concern   Not on file  Social History Narrative   Not on file  Social Drivers of Corporate investment banker Strain: Not on file  Food Insecurity: No Food Insecurity (08/09/2023)    Hunger Vital Sign    Worried About Running Out of Food in the Last Year: Never true    Ran Out of Food in the Last Year: Never true  Transportation Needs: No Transportation Needs (08/09/2023)   PRAPARE - Administrator, Civil Service (Medical): No    Lack of Transportation (Non-Medical): No  Physical Activity: Not on file  Stress: Not on file  Social Connections: Moderately Isolated (08/09/2023)   Social Connection and Isolation Panel [NHANES]    Frequency of Communication with Friends and Family: More than three times a week    Frequency of Social Gatherings with Friends and Family: More than three times a week    Attends Religious Services: More than 4 times per year    Active Member of [employer] or Organizations: No    Attends Banker Meetings: Never    Marital Status: Widowed  Intimate Partner Violence: Not At Risk (08/09/2023)   Humiliation, Afraid, Rape, and Kick questionnaire    Fear of Current or Ex-Partner: No    Emotionally Abused: No    Physically Abused: No    Sexually Abused: No    Review of Systems: Pertinent positive and negative review of systems were noted in the above HPI section.  All other review of systems was otherwise negative.   Physical Exam: Vital signs in last 24 hours: Temp:  [98.2 F (36.8 C)] 98.2 F (36.8 C) (03/14 1201) Pulse Rate:  [94] 94 (03/14 1201) Resp:  [20] 20 (03/14 1201) BP: (121)/(73) 121/73 (03/14 1201) SpO2:  [97 %] 97 % (03/14 1201)   General:   Alert,  Well-developed, well-nourished, older African-American female, pleasant and cooperative in NAD Head:  Normocephalic and atraumatic. Eyes:  Sclera clear, no icterus.   Conjunctiva pink. Ears:  Normal auditory acuity. Nose:  No deformity, discharge,  or lesions. Mouth:  No deformity or lesions.   Neck:  Supple; no masses or thyromegaly. Lungs:   Clear throughout to auscultation.   No wheezes, crackles, or rhonchi. Heart:  Regular rate and rhythm; no murmurs,  clicks, rubs,  or gallops. Abdomen:  Soft,nontender, BS active,nonpalp mass or hsm.   Rectal: Not done Msk:  Symmetrical without gross deformities. . Pulses:  Normal pulses noted. Extremities: Right lower arm in cast,status post left BKA Neurologic:  Alert and  oriented x4;  grossly normal neurologically. Skin:  Intact without significant lesions or rashes.. Psych:  Alert and cooperative. Normal mood and affect.  Intake/Output from previous day: No intake/output data recorded. Intake/Output this shift: No intake/output data recorded.  Lab Results: Recent Labs    08/09/23 1230  WBC 6.1  HGB 9.4*  HCT 30.4*  PLT 300   BMET Recent Labs    08/07/23 1500 08/09/23 1230  NA 132* 135  K 4.9 5.1  CL 103 107  CO2 16* 19*  GLUCOSE 102* 119*  BUN 54* 38*  CREATININE 2.58* 2.10*  CALCIUM 8.1* 8.2*   LFT Recent Labs    08/09/23 1230  PROT 7.0  ALBUMIN 3.4*  AST 26  ALT 13  ALKPHOS 65  BILITOT 0.4   PT/INR No results for input(s): "LABPROT", "INR" in the last 72 hours. Hepatitis Panel No results for input(s): "HEPBSAG", "HCVAB", "HEPAIGM", "HEPBIGM" in the last 72 hours.    IMPRESSION:  #73 73 year old African-American female presenting with melena and anemia.  Onset of melena yesterday morning with 3 episodes total. Hemoglobin 9.4 in the ER today which is stable for her (at last check 1 month ago hemoglobin was 9.0)  No blood thinners, no regular aspirin or NSAIDs Patient has history of recurrent GI bleeding secondary to small bowel AVMs and has had multiple procedures in the past for GI bleeding. Last episode of bleeding was in 2022-underwent enteroscopy at that time with finding of 1 medium sized AVM in the proximal jejunum which was treated with APC. Last colonoscopy October 2019 negative other than noting d colocolonic anastomosis in the rectosigmoid  Suspect recurrent bleeding secondary to small bowel AVMs, cannot rule out peptic ulcer disease, erosive  gastropathy  #2 anemia acute and chronic-recent iron studies from February 2025 showed iron deficiency with ferritin of 5-followed by hematology  #3 chronic kidney disease #4 hypertension #5.  Diabetes mellitus with neuropathy #6 substance abuse-cocaine  PLAN: Clear liquid diet today, n.p.o. past midnight Trend hemoglobin every 6 hours and transfuse as indicated for hemoglobin 7 or less IV PPI twice daily Patient will be scheduled for enteroscopy with Dr. Doy Hutching for tomorrow 08/10/2023.  If enteroscopy is unrevealing then may place capsule endoscopy at that same setting.  Enteroscopy and capsule endoscopy were both discussed with the patient in detail today including indications risks and benefits and she is agreeable to proceed.  GI will follow with you.   Sarahbeth Cashin PA-C 08/09/2023, 2:41 PM

## 2023-08-09 NOTE — ED Provider Notes (Signed)
 [place] EMERGENCY DEPARTMENT AT [facility] Provider Note   CSN: [id number] Arrival date & time: 08/09/23  1152     History  Chief Complaint  Patient presents with   Rectal Bleeding    [name] is a 73 y.o. female presented to ED with concern for dark stools.  Patient has noted the symptoms on and off for the past 24 hours.  Reports black, bloody looking stools, for the past 3 bowel movements.  Reports prior history of GI bleeding.  She does report yesterday she had surgery to readdress the fracture and orthopedic hardware in her right wrist, she is also having intolerable pain, reporting that she has been taking Tylenol at home.  She was prescribed oxycodone but says "oxycodone does not work for me".  She also reports the tramadol and all oral opioids "do not work for me".  She does not tolerate morphine or codeine products because they cause mood swings.  Patient had a small bowel endoscopy performed in February 2022 per my review of external records where she was noted to have an AVM that was ablated at the time.  Last time she was seen by GI was also at that time in 2022.  HPI     Home Medications Prior to Admission medications   Medication Sig Start Date End Date Taking? Authorizing Provider  acetaminophen (TYLENOL) 500 MG tablet Take 1,000 mg by mouth every 6 (six) hours as needed for mild pain (pain score 1-3) or moderate pain (pain score 4-6).   Yes [provider]  allopurinol (ZYLOPRIM) 100 MG tablet Take 1/2 tablet (50 mg total) by mouth daily.  Begin medication after one week of colchicine treatment. Patient taking differently: Take 50 mg by mouth as needed (gout). 06/13/23  Yes Bender, Irving Burton, DO  amLODipine (NORVASC) 10 MG tablet Take 1 tablet (10 mg total) by mouth daily. Patient taking differently: Take 10 mg by mouth in the morning. 03/12/23  Yes Juberg, Cristal Deer, DO  calcium-vitamin D (OSCAL WITH D) 500-200 MG-UNIT tablet Take 1 tablet by  mouth 2 (two) times daily. 01/21/21  Yes Glade Lloyd, MD  camphor-menthol [facility]) lotion Apply 1 Application topically as needed for itching. 07/31/22  Yes Doran Stabler, DO  carvedilol (COREG) 3.125 MG tablet Take 1 tablet (3.125 mg total) by mouth 2 (two) times daily. 06/13/23 06/12/24 Yes Bender, Irving Burton, DO  dapagliflozin propanediol (FARXIGA) 10 MG TABS tablet Take 1 tablet by mouth daily. Patient taking differently: Take 10 mg by mouth in the morning. 12/14/22  Yes   Dulaglutide (TRULICITY) 0.75 MG/0.5ML SOAJ Inject 0.75 mg into the skin once a week. Patient taking differently: Inject 0.75 mg into the skin once a week. On Sunday 12/11/22 12/11/23 Yes Juberg, Cristal Deer, DO  famotidine (PEPCID) 20 MG tablet Take 1 tablet (20 mg total) by mouth 2 (two) times daily. Patient taking differently: Take 20 mg by mouth as needed for heartburn or indigestion. 11/01/22  Yes Lyndle Herrlich, MD  gabapentin (NEURONTIN) 400 MG capsule Take 1 capsule (400 mg total) by mouth in the morning AND 1 capsule (400 mg total) daily in the afternoon AND 2 capsules (800 mg total) before bedtime. 05/20/23  Yes Atway, Rayann N, DO  HYDROcodone-acetaminophen (NORCO/VICODIN) 5-325 MG tablet Take 1-2 tablets by mouth every 6 (six) hours as needed for pain. 08/08/23  Yes Betha Loa, MD  losartan (COZAAR) 100 MG tablet Take 1 tablet (100 mg total) by mouth daily. Patient taking differently: Take 100 mg by  mouth in the morning. 06/13/23  Yes Faith Rogue, DO  omeprazole (PRILOSEC) 20 MG capsule Take 1 capsule (20 mg total) by mouth daily. Patient taking differently: Take 20 mg by mouth in the morning. 08/07/23  Yes Juberg, Cristal Deer, DO  rosuvastatin (CRESTOR) 20 MG tablet Take 1 tablet (20 mg total) by mouth daily. Patient taking differently: Take 20 mg by mouth in the morning. 05/20/23  Yes Juberg, Cristal Deer, DO  ACCU-CHEK FASTCLIX LANCETS MISC Use to test blood glucose 3 times daily. 10/04/16   Nyra Market, MD  Blood  Glucose Monitoring Suppl (ACCU-CHEK GUIDE) w/Device KIT 1 each by Does not apply route 3 (three) times daily. The patient is insulin requiring, ICD 10 code E11.65. The patient tests 3 times a day. 02/05/19   Verdene Lennert, MD  glucose blood (ACCU-CHEK GUIDE) test strip Use as instructed 08/23/21   Verdene Lennert, MD      Allergies    Ciprofloxacin, Morphine and codeine, Penicillins, Codeine, Lisinopril, and Feraheme [ferumoxytol]    Review of Systems   Review of Systems  Physical Exam Updated Vital Signs BP 95/64 (BP Location: Left Arm)   Pulse 83   Temp 99.5 F (37.5 C) (Oral)   Resp 16   SpO2 96%  Physical Exam Constitutional:      General: She is not in acute distress. HENT:     Head: Normocephalic and atraumatic.  Eyes:     Conjunctiva/sclera: Conjunctivae normal.     Pupils: Pupils are equal, round, and reactive to light.  Cardiovascular:     Rate and Rhythm: Normal rate and regular rhythm.  Pulmonary:     Effort: Pulmonary effort is normal. No respiratory distress.  Abdominal:     General: There is no distension.     Tenderness: There is no abdominal tenderness.  Genitourinary:    Comments: Small amount melena on rectal exam Musculoskeletal:     Comments: Right arm in cast, patient able to move fingers  Skin:    General: Skin is warm and dry.  Neurological:     General: No focal deficit present.     Mental Status: She is alert. Mental status is at baseline.  Psychiatric:        Mood and Affect: Mood normal.        Behavior: Behavior normal.     ED Results / Procedures / Treatments   Labs (all labs ordered are listed, but only abnormal results are displayed) Labs Reviewed  COMPREHENSIVE METABOLIC PANEL - Abnormal; Notable for the following components:      Result Value   CO2 19 (*)    Glucose, Bld 119 (*)    BUN 38 (*)    Creatinine, Ser 2.10 (*)    Calcium 8.2 (*)    Albumin 3.4 (*)    GFR, Estimated 25 (*)    All other components within normal  limits  CBC - Abnormal; Notable for the following components:   RBC 3.49 (*)    Hemoglobin 9.4 (*)    HCT 30.4 (*)    RDW 22.1 (*)    All other components within normal limits  HEMOGLOBIN AND HEMATOCRIT, BLOOD - Abnormal; Notable for the following components:   Hemoglobin 9.0 (*)    HCT 28.9 (*)    All other components within normal limits  GLUCOSE, CAPILLARY - Abnormal; Notable for the following components:   Glucose-Capillary 108 (*)    All other components within normal limits  POC OCCULT BLOOD, ED - Abnormal; Notable  for the following components:   Fecal Occult Bld POSITIVE (*)    All other components within normal limits  HEMOGLOBIN AND HEMATOCRIT, BLOOD  HEMOGLOBIN AND HEMATOCRIT, BLOOD  HIV ANTIBODY (ROUTINE TESTING W REFLEX)  TYPE AND SCREEN    EKG None  Radiology DG MINI C-ARM IMAGE ONLY Result Date: 08/08/2023 There is no interpretation for this exam.  This order is for images obtained during a surgical procedure.  Please See "Surgeries" Tab for more information regarding the procedure.    Procedures Procedures    Medications Ordered in ED Medications  pantoprazole (PROTONIX) injection 40 mg (40 mg Intravenous Given 08/09/23 1614)  HYDROmorphone (DILAUDID) injection 0.5-1 mg (has no administration in time range)  acetaminophen (TYLENOL) tablet 650 mg (has no administration in time range)    Or  acetaminophen (TYLENOL) suppository 650 mg (has no administration in time range)  polyethylene glycol (MIRALAX / GLYCOLAX) packet 17 g (has no administration in time range)  dapagliflozin propanediol (FARXIGA) tablet 10 mg (has no administration in time range)  gabapentin (NEURONTIN) capsule 400 mg (has no administration in time range)    And  gabapentin (NEURONTIN) capsule 400 mg (has no administration in time range)    And  gabapentin (NEURONTIN) capsule 800 mg (has no administration in time range)  rosuvastatin (CRESTOR) tablet 10 mg (has no administration in time  range)  insulin aspart (novoLOG) injection 0-9 Units (has no administration in time range)  HYDROmorphone (DILAUDID) injection 0.5 mg (0.5 mg Intravenous Given 08/09/23 1235)  HYDROmorphone (DILAUDID) injection 1 mg (1 mg Intravenous Given 08/09/23 1613)    ED Course/ Medical Decision Making/ A&P Clinical Course as of 08/09/23 1748  Fri Aug 09, 2023  1349 Fecal Occult Blood, POC(!): POSITIVE [MT]  [facility] consulted - plan to admit for obs [MT]    Clinical Course User Index [MT] Gerda Yin, Kermit Balo, MD                                 Medical Decision Making Amount and/or Complexity of Data Reviewed Labs: ordered. Decision-making details documented in ED Course.  Risk Prescription drug management. Decision regarding hospitalization.   This patient presents to the ED with concern for dark stools. This involves an extensive number of treatment options, and is a complaint that carries with it a high risk of complications and morbidity.  The differential diagnosis includes GI bleed vs other  Co-morbidities that complicate the patient evaluation: hx of upper GI bleed  External records from outside source obtained and reviewed including GI workup 2022 with AVM ablation  I ordered and personally interpreted labs.  The pertinent results include:  hgb 9.4 (near baseline), hemoccult positive with melena  IV dilaudid 0.5 mg given for wrist/arm pain   The patient was maintained on a cardiac monitor.  I personally viewed and interpreted the cardiac monitored which showed an underlying rhythm of: regular HR   Test Considered: no indication for emergent ct imaging at this time  I requested consultation with the GI,  and discussed lab and imaging findings as well as pertinent plan - they recommend: observation admission, hgb trending, final recommendations pending upon admission  After the interventions noted above, I reevaluated the patient and found that they have: improved -  patient's wrist pain improved with small dose dilaudid  Dispostion:  After consideration of the diagnostic results and the patients response to treatment, I feel that the  patent would benefit from medical admission         Final Clinical Impression(s) / ED Diagnoses Final diagnoses:  Rectal bleeding    Rx / DC Orders ED Discharge Orders     None         Izel Eisenhardt, Kermit Balo, MD 08/09/23 1750

## 2023-08-09 NOTE — ED Notes (Signed)
 Pt refused to lay on back so I could listen to abdomen and so she can be hooked up to monitor

## 2023-08-10 ENCOUNTER — Encounter (HOSPITAL_COMMUNITY): Payer: Self-pay | Admitting: Internal Medicine

## 2023-08-10 ENCOUNTER — Other Ambulatory Visit (HOSPITAL_COMMUNITY): Payer: Self-pay

## 2023-08-10 ENCOUNTER — Observation Stay (HOSPITAL_COMMUNITY): Admitting: Anesthesiology

## 2023-08-10 ENCOUNTER — Encounter (HOSPITAL_COMMUNITY)
Admission: EM | Disposition: A | Discharge status: Home / Self Care | Payer: Self-pay | Source: Home / Self Care | Attending: Emergency Medicine

## 2023-08-10 DIAGNOSIS — K922 Gastrointestinal hemorrhage, unspecified: Secondary | ICD-10-CM | POA: Diagnosis not present

## 2023-08-10 DIAGNOSIS — K449 Diaphragmatic hernia without obstruction or gangrene: Secondary | ICD-10-CM

## 2023-08-10 DIAGNOSIS — K31819 Angiodysplasia of stomach and duodenum without bleeding: Secondary | ICD-10-CM | POA: Diagnosis not present

## 2023-08-10 DIAGNOSIS — F17210 Nicotine dependence, cigarettes, uncomplicated: Secondary | ICD-10-CM

## 2023-08-10 DIAGNOSIS — K921 Melena: Secondary | ICD-10-CM

## 2023-08-10 DIAGNOSIS — D509 Iron deficiency anemia, unspecified: Secondary | ICD-10-CM | POA: Diagnosis not present

## 2023-08-10 DIAGNOSIS — D649 Anemia, unspecified: Secondary | ICD-10-CM

## 2023-08-10 DIAGNOSIS — I10 Essential (primary) hypertension: Secondary | ICD-10-CM

## 2023-08-10 HISTORY — PX: ENTEROSCOPY: SHX5533

## 2023-08-10 HISTORY — PX: HOT HEMOSTASIS: SHX5433

## 2023-08-10 LAB — CBC
HCT: 28.8 % — ABNORMAL LOW (ref 36.0–46.0)
Hemoglobin: 9.1 g/dL — ABNORMAL LOW (ref 12.0–15.0)
MCH: 27.2 pg (ref 26.0–34.0)
MCHC: 31.6 g/dL (ref 30.0–36.0)
MCV: 86 fL (ref 80.0–100.0)
Platelets: 271 10*3/uL (ref 150–400)
RBC: 3.35 MIL/uL — ABNORMAL LOW (ref 3.87–5.11)
RDW: 22.2 % — ABNORMAL HIGH (ref 11.5–15.5)
WBC: 4.7 10*3/uL (ref 4.0–10.5)
nRBC: 0 % (ref 0.0–0.2)

## 2023-08-10 LAB — GLUCOSE, CAPILLARY
Glucose-Capillary: 116 mg/dL — ABNORMAL HIGH (ref 70–99)
Glucose-Capillary: 187 mg/dL — ABNORMAL HIGH (ref 70–99)
Glucose-Capillary: 76 mg/dL (ref 70–99)

## 2023-08-10 LAB — BASIC METABOLIC PANEL
Anion gap: 9 (ref 5–15)
BUN: 33 mg/dL — ABNORMAL HIGH (ref 8–23)
CO2: 19 mmol/L — ABNORMAL LOW (ref 22–32)
Calcium: 8.4 mg/dL — ABNORMAL LOW (ref 8.9–10.3)
Chloride: 110 mmol/L (ref 98–111)
Creatinine, Ser: 1.73 mg/dL — ABNORMAL HIGH (ref 0.44–1.00)
GFR, Estimated: 31 mL/min — ABNORMAL LOW (ref >60)
Glucose, Bld: 115 mg/dL — ABNORMAL HIGH (ref 70–99)
Potassium: 4.9 mmol/L (ref 3.5–5.1)
Sodium: 138 mmol/L (ref 135–145)

## 2023-08-10 LAB — HEMOGLOBIN AND HEMATOCRIT, BLOOD
HCT: 30.2 % — ABNORMAL LOW (ref 36.0–46.0)
HCT: 31.2 % — ABNORMAL LOW (ref 36.0–46.0)
Hemoglobin: 9.3 g/dL — ABNORMAL LOW (ref 12.0–15.0)
Hemoglobin: 9.8 g/dL — ABNORMAL LOW (ref 12.0–15.0)

## 2023-08-10 SURGERY — ENTEROSCOPY
Anesthesia: Monitor Anesthesia Care

## 2023-08-10 MED ORDER — HYDROMORPHONE HCL 1 MG/ML IJ SOLN
0.5000 mg | INTRAMUSCULAR | Status: DC | PRN
  Administered 2023-08-10: 1 mg via INTRAVENOUS
  Filled 2023-08-10: qty 1

## 2023-08-10 MED ORDER — ESMOLOL HCL 100 MG/10ML IV SOLN
INTRAVENOUS | Status: DC | PRN
  Administered 2023-08-10: 30 mg via INTRAVENOUS
  Administered 2023-08-10: 10 mg via INTRAVENOUS

## 2023-08-10 MED ORDER — PANTOPRAZOLE SODIUM 40 MG PO TBEC
40.0000 mg | DELAYED_RELEASE_TABLET | Freq: Every day | ORAL | 0 refills | Status: DC
  Filled 2023-08-10: qty 42, 42d supply, fill #0

## 2023-08-10 MED ORDER — GLUCAGON HCL RDNA (DIAGNOSTIC) 1 MG IJ SOLR
INTRAMUSCULAR | Status: DC | PRN
  Administered 2023-08-10 (×2): .3 mg via INTRAVENOUS

## 2023-08-10 MED ORDER — PROPOFOL 10 MG/ML IV BOLUS
INTRAVENOUS | Status: DC | PRN
  Administered 2023-08-10 (×2): 20 mg via INTRAVENOUS
  Administered 2023-08-10: 50 mg via INTRAVENOUS
  Administered 2023-08-10 (×4): 20 mg via INTRAVENOUS
  Administered 2023-08-10: 30 mg via INTRAVENOUS
  Administered 2023-08-10: 20 mg via INTRAVENOUS

## 2023-08-10 MED ORDER — LIDOCAINE 2% (20 MG/ML) 5 ML SYRINGE
INTRAMUSCULAR | Status: DC | PRN
  Administered 2023-08-10: 100 mg via INTRAVENOUS

## 2023-08-10 MED ORDER — ONDANSETRON HCL 4 MG/2ML IJ SOLN
4.0000 mg | Freq: Three times a day (TID) | INTRAMUSCULAR | Status: DC | PRN
Start: 2023-08-10 — End: 2023-08-11
  Administered 2023-08-10: 4 mg via INTRAVENOUS
  Filled 2023-08-10: qty 2

## 2023-08-10 MED ORDER — SODIUM CHLORIDE 0.9 % IV SOLN: INTRAVENOUS | Status: DC | PRN

## 2023-08-10 MED ORDER — ROSUVASTATIN CALCIUM 10 MG PO TABS
10.0000 mg | ORAL_TABLET | Freq: Every morning | ORAL | 11 refills | Status: DC
  Filled 2023-08-10 – 2023-09-12 (×3): qty 30, 30d supply, fill #0
  Filled 2023-10-05: qty 30, 30d supply, fill #1
  Filled 2023-11-04: qty 30, 30d supply, fill #2
  Filled 2023-12-04: qty 30, 30d supply, fill #3
  Filled 2023-12-30: qty 30, 30d supply, fill #4
  Filled 2024-01-27: qty 30, 30d supply, fill #5
  Filled 2024-02-25: qty 30, 30d supply, fill #6

## 2023-08-10 NOTE — Anesthesia Postprocedure Evaluation (Signed)
 Anesthesia Post Note  Patient: [name]  Procedure(s) Performed: ENTEROSCOPY EGD, WITH ARGON PLASMA COAGULATION     Patient location during evaluation: PACU Anesthesia Type: MAC Level of consciousness: awake and alert Pain management: pain level controlled Vital Signs Assessment: post-procedure vital signs reviewed and stable Respiratory status: spontaneous breathing, nonlabored ventilation and respiratory function stable Cardiovascular status: stable and blood pressure returned to baseline Anesthetic complications: no   There were no known notable events for this encounter.  Last Vitals:  Vitals:   08/10/23 1145 08/10/23 1245  BP: 121/69 107/69  Pulse: 76 82  Resp: 11 16  Temp: 36.9 C 37.2 C  SpO2: 93% 96%    Last Pain:  Vitals:   08/10/23 1245  TempSrc: Oral  PainSc:                  Beryle Lathe

## 2023-08-10 NOTE — Care Management Obs Status (Signed)
 MEDICARE OBSERVATION STATUS NOTIFICATION   Patient Details  Name: [name] MRN: [id number] Date of Birth: 19-Jan-1951   Medicare Observation Status Notification Given:  Yes    Lawerance Sabal, RN 08/10/2023, 12:49 PM

## 2023-08-10 NOTE — Op Note (Signed)
 [facility] Patient Name: [name] Procedure Date : 08/10/2023 MRN: [id number] Attending MD: Maren Beach , MD, [id number] Date of Birth: February 28, 1951 CSN: [id number] Age: 73 Admit Type: Inpatient Procedure:                Small bowel enteroscopy Indications:              Anemia, Melena, Follow-up of angioectasias in small                            bowel seen on previous enteroscopies Providers:                Maren Beach, MD, Margaree Mackintosh, RN,                            Brion Aliment, Technician Referring MD:              Medicines:                Monitored Anesthesia Care Complications:            No immediate complications. Estimated blood loss:                            Minimal. Estimated Blood Loss:     Estimated blood loss was minimal. Procedure:                Pre-Anesthesia Assessment:                           - Prior to the procedure, a History and Physical                            was performed, and patient medications and                            allergies were reviewed. The patient's tolerance of                            previous anesthesia was also reviewed. The risks                            and benefits of the procedure and the sedation                            options and risks were discussed with the patient.                            All questions were answered, and informed consent                            was obtained. Prior Anticoagulants: The patient has                            taken no anticoagulant or antiplatelet agents. ASA  Grade Assessment: III - A patient with severe                            systemic disease. After reviewing the risks and                            benefits, the patient was deemed in satisfactory                            condition to undergo the procedure.                           After obtaining informed consent, the endoscope was                             passed under direct vision. Throughout the                            procedure, the patient's blood pressure, pulse, and                            oxygen saturations were monitored continuously. The                            [id number]-HQ190L ([id number]) Olympus colonoscope was                            introduced through the mouth and advanced to the                            proximal jejunum. The small bowel enteroscopy was                            accomplished without difficulty. The patient                            tolerated the procedure well. Scope In: Scope Out: Findings:      The examined esophagus was normal.      The gastric body, gastric antrum, cardia (on retroflexion) and gastric       fundus (on retroflexion) were normal.      A small hiatal hernia was present.      Multiple angioectasias with no bleeding were found in the duodenal bulb,       in the second portion of the duodenum and in the third portion of the       duodenum. Coagulation for tissue destruction using argon plasma at 2       liters/minute and 20 watts was successful.      There was no evidence of significant pathology in the proximal jejunum. Impression:               - Normal esophagus.                           - Normal gastric body, antrum, cardia and gastric  fundus.                           - Small hiatal hernia.                           - Multiple non-bleeding angioectasias in the                            duodenum. Treated with argon plasma coagulation                            (APC).                           - The examined portion of the jejunum was normal.                           - No specimens collected. Recommendation:           - Return to hospital ward for ongoing care                           - Follow serial hemoglobin and hematocrit                           - If there is evidence of ongoing bleeding can                            consider EGD to  reevaluate duodenal AVMs with video                            capsule placement to assess remainder of small                            bowel for additional AVMs. Procedure Code(s):        --- Professional ---                           [phone], Small intestinal endoscopy, enteroscopy                            beyond second portion of duodenum, not including                            ileum; with ablation of tumor(s), polyp(s), or                            other lesion(s) not amenable to removal by hot                            biopsy forceps, bipolar cautery or snare technique Diagnosis Code(s):        --- Professional ---                           K44.9, Diaphragmatic hernia without obstruction or  gangrene                           K31.819, Angiodysplasia of stomach and duodenum                            without bleeding                           D64.9, Anemia, unspecified                           K92.1, Melena (includes Hematochezia) CPT copyright 2022 [employer]. All rights reserved. The codes documented in this report are preliminary and upon coder review may  be revised to meet current compliance requirements. Maren Beach, MD 08/10/2023 11:38:23 AM This report has been signed electronically. Number of Addenda: 0

## 2023-08-10 NOTE — Plan of Care (Signed)

## 2023-08-10 NOTE — H&P (Signed)
 [facility] History and Physical   Primary Care Physician:  Katheran James, DO   Reason for Procedure:  Melena, anemia, history of intestinal AVMs  Plan:    Small bowel enteroscopy     HPI: [name] is a 73 y.o. female undergoing small bowel enteroscopy after presenting to the hospital with melena, hemoglobin of 9.  She has a pertinent past medical history of intestinal AVMs.  Last SBE in 2022 with 1 nonbleeding AVM in the jejunum status post APC.   Past Medical History:  Diagnosis Date   Allergic rhinitis    Anemia, iron deficiency 05/03/2011   2/2 GI AVMs - recieves iron infusions as well as periodic red blood cell transfusions   Anxiety    Arteriovenous malformation of duodenum 02/25/2015   CARPAL TUNNEL RELEASE, HX OF 07/22/2008   Chest pain 10/28/2019   Colon polyps    Diabetes mellitus without complication (HCC)    type 2   Difficulty sleeping    takes trazadone for sleep   Diverticulosis    Electrolyte abnormality 01/25/2021   Fall 01/03/2020   GERD (gastroesophageal reflux disease)    GI bleed 07/07/2020   Hepatitis C    in SVR with Harvoni 12/2014-02/2015.    History of hernia repair 08/18/2012   History of porphyria 11/30/2014   Porphyria cutanea tarda in setting of chronic Hepatitis C, frequent  IV iron infusions, and alcohol abuse    Hypertension    Hypocalcemia 10/28/2019   Hypocalcemia 01/19/2021   Hypokalemia 09/27/2014   Hypokalemia 01/19/2021   Hypomagnesemia 01/19/2021   Kidney disease 02/04/2019   Peripheral vascular disease (HCC)    Prolonged QT interval 01/19/2021   Stroke (HCC) 2010   no residual deficits   Substance use disorder    cocaine   Tachycardia 07/10/2017   Trichomonas vaginitis 04/16/2019    Past Surgical History:  Procedure Laterality Date   ABDOMINAL HYSTERECTOMY     APPLICATION OF WOUND VAC N/A 06/21/2014   Procedure: APPLICATION OF WOUND VAC;  Surgeon: Abigail Miyamoto, MD;  Location: [facility] OR;  Service: General;   Laterality: N/A;   CARPAL TUNNEL RELEASE     rt hand   COLONOSCOPY WITH PROPOFOL N/A 03/17/2018   Procedure: COLONOSCOPY WITH PROPOFOL;  Surgeon: Sherrilyn Rist, MD;  Location: [facility];  Service: Gastroenterology;  Laterality: N/A;   ENTEROSCOPY N/A 12/04/2012   Procedure: ENTEROSCOPY;  Surgeon: Theda Belfast, MD;  Location: [facility];  Service: Endoscopy;  Laterality: N/A;   ENTEROSCOPY N/A 12/18/2012   Procedure: ENTEROSCOPY;  Surgeon: Theda Belfast, MD;  Location: [facility];  Service: Endoscopy;  Laterality: N/A;   ENTEROSCOPY N/A 02/25/2015   Procedure: ENTEROSCOPY;  Surgeon: Louis Meckel, MD;  Location: [facility]COPY;  Service: Endoscopy;  Laterality: N/A;   ENTEROSCOPY N/A 10/29/2017   Procedure: ENTEROSCOPY;  Surgeon: Sherrilyn Rist, MD;  Location: [facility];  Service: Gastroenterology;  Laterality: N/A;   ENTEROSCOPY N/A 07/08/2020   Procedure: ENTEROSCOPY;  Surgeon: Rachael Fee, MD;  Location: [facility];  Service: Endoscopy;  Laterality: N/A;   ESOPHAGOGASTRODUODENOSCOPY  05/05/2011   Procedure: ESOPHAGOGASTRODUODENOSCOPY (EGD);  Surgeon: Erick Blinks, MD;  Location: [facility];  Service: Gastroenterology;  Laterality: N/A;  Dr. Rhea Belton will do procedure for Dr. [name] Saturday.   ESOPHAGOGASTRODUODENOSCOPY  05/08/2011   Procedure: ESOPHAGOGASTRODUODENOSCOPY (EGD);  Surgeon: Theda Belfast;  [facility]: [facility];  Service: Endoscopy;  Laterality: N/A;   ESOPHAGOGASTRODUODENOSCOPY  06/07/2011   Procedure: ESOPHAGOGASTRODUODENOSCOPY (EGD);  Surgeon: Theda Belfast, MD;  Location: [facility];  Service: Endoscopy;  Laterality: N/A;   ESOPHAGOGASTRODUODENOSCOPY  12/20/2011   Procedure: ESOPHAGOGASTRODUODENOSCOPY (EGD);  Surgeon: Theda Belfast, MD;  Location: [facility];  Service: Endoscopy;  Laterality: N/A;   EYE SURGERY Bilateral    cataracts   FLEXIBLE SIGMOIDOSCOPY  12/21/2011   Procedure: FLEXIBLE SIGMOIDOSCOPY;  Surgeon: Theda Belfast, MD;  Location:  [facility];  Service: Endoscopy;  Laterality: N/A;   HARDWARE REMOVAL Right 08/08/2023   Procedure: HARDWARE REMOVAL RIGHT WRIST;  Surgeon: Betha Loa, MD;  Location: [facility];  Service: Orthopedics;  Laterality: Right;  60 MIN   HOT HEMOSTASIS  06/07/2011   Procedure: HOT HEMOSTASIS (ARGON PLASMA COAGULATION/BICAP);  Surgeon: Theda Belfast, MD;  Location: [facility];  Service: Endoscopy;  Laterality: N/A;   HOT HEMOSTASIS N/A 07/08/2020   Procedure: HOT HEMOSTASIS (ARGON PLASMA COAGULATION/BICAP);  Surgeon: Rachael Fee, MD;  Location: [facility];  Service: Endoscopy;  Laterality: N/A;   INCISIONAL HERNIA REPAIR N/A 06/01/2014   Procedure: ATTEMPTED LAPAROSCOPIC AND OPEN INCISIONAL HERNIA REPAIR WITH MESH;  Surgeon: Abigail Miyamoto, MD;  Location: [facility] OR;  Service: General;  Laterality: N/A;   INSERTION OF MESH N/A 06/01/2014   Procedure: INSERTION OF MESH;  Surgeon: Abigail Miyamoto, MD;  Location: [facility];  Service: General;  Laterality: N/A;   LAPAROTOMY  02/16/2012   Procedure: EXPLORATORY LAPAROTOMY;  Surgeon: Mariella Saa, MD;  Location: [facility];  Service: General;  Laterality: N/A;  oversewing of anastomotic leak and rigid sigmoidoscopy   LAPAROTOMY N/A 06/21/2014   Procedure: ABDOMINAL WOUND EXPLORATION;  Surgeon: Abigail Miyamoto, MD;  Location: [facility];  Service: General;  Laterality: N/A;   LEG AMPUTATION ABOVE KNEE     left   PARTIAL COLECTOMY  02/15/2012   Procedure: PARTIAL COLECTOMY;  Surgeon: Shelly Rubenstein, MD;  Location: [facility];  Service: General;  Laterality: N/A;   SUBMUCOSAL INJECTION  07/08/2020   Procedure: SUBMUCOSAL INJECTION;  Surgeon: Rachael Fee, MD;  Location: [facility];  Service: Endoscopy;;   TONSILLECTOMY     WRIST FUSION Right 04/27/2021   Procedure: SCAPHOID EXCISION  WITH 4 BONE FUSION RIGHT WRIST;  Surgeon: Betha Loa, MD;  Location: [facility];  Service: Orthopedics;  Laterality: Right;    Prior to  Admission medications   Medication Sig Start Date End Date Taking? Authorizing Provider  acetaminophen (TYLENOL) 500 MG tablet Take 1,000 mg by mouth every 6 (six) hours as needed for mild pain (pain score 1-3) or moderate pain (pain score 4-6).   Yes [provider]  allopurinol (ZYLOPRIM) 100 MG tablet Take 1/2 tablet (50 mg total) by mouth daily.  Begin medication after one week of colchicine treatment. Patient taking differently: Take 50 mg by mouth as needed (gout). 06/13/23  Yes Bender, Irving Burton, DO  amLODipine (NORVASC) 10 MG tablet Take 1 tablet (10 mg total) by mouth daily. Patient taking differently: Take 10 mg by mouth in the morning. 03/12/23  Yes Juberg, Cristal Deer, DO  calcium-vitamin D (OSCAL WITH D) 500-200 MG-UNIT tablet Take 1 tablet by mouth 2 (two) times daily. 01/21/21  Yes Glade Lloyd, MD  camphor-menthol [facility]) lotion Apply 1 Application topically as needed for itching. 07/31/22  Yes Doran Stabler, DO  carvedilol (COREG) 3.125 MG tablet Take 1 tablet (3.125 mg total) by mouth 2 (two) times daily. 06/13/23 06/12/24 Yes Bender, Irving Burton, DO  dapagliflozin propanediol (FARXIGA) 10 MG TABS tablet Take 1 tablet by  mouth daily. Patient taking differently: Take 10 mg by mouth in the morning. 12/14/22  Yes   Dulaglutide (TRULICITY) 0.75 MG/0.5ML SOAJ Inject 0.75 mg into the skin once a week. Patient taking differently: Inject 0.75 mg into the skin once a week. On Sunday 12/11/22 12/11/23 Yes Juberg, Cristal Deer, DO  famotidine (PEPCID) 20 MG tablet Take 1 tablet (20 mg total) by mouth 2 (two) times daily. Patient taking differently: Take 20 mg by mouth as needed for heartburn or indigestion. 11/01/22  Yes Lyndle Herrlich, MD  gabapentin (NEURONTIN) 400 MG capsule Take 1 capsule (400 mg total) by mouth in the morning AND 1 capsule (400 mg total) daily in the afternoon AND 2 capsules (800 mg total) before bedtime. 05/20/23  Yes Atway, Rayann N, DO  HYDROcodone-acetaminophen  (NORCO/VICODIN) 5-325 MG tablet Take 1-2 tablets by mouth every 6 (six) hours as needed for pain. 08/08/23  Yes Betha Loa, MD  losartan (COZAAR) 100 MG tablet Take 1 tablet (100 mg total) by mouth daily. Patient taking differently: Take 100 mg by mouth in the morning. 06/13/23  Yes Faith Rogue, DO  omeprazole (PRILOSEC) 20 MG capsule Take 1 capsule (20 mg total) by mouth daily. Patient taking differently: Take 20 mg by mouth in the morning. 08/07/23  Yes Juberg, Cristal Deer, DO  rosuvastatin (CRESTOR) 20 MG tablet Take 1 tablet (20 mg total) by mouth daily. Patient taking differently: Take 20 mg by mouth in the morning. 05/20/23  Yes Juberg, Cristal Deer, DO  ACCU-CHEK FASTCLIX LANCETS MISC Use to test blood glucose 3 times daily. 10/04/16   Nyra Market, MD  Blood Glucose Monitoring Suppl (ACCU-CHEK GUIDE) w/Device KIT 1 each by Does not apply route 3 (three) times daily. The patient is insulin requiring, ICD 10 code E11.65. The patient tests 3 times a day. 02/05/19   Verdene Lennert, MD  glucose blood (ACCU-CHEK GUIDE) test strip Use as instructed 08/23/21   Verdene Lennert, MD    Current Facility-Administered Medications  Medication Dose Route Frequency Provider Last Rate Last Admin   [MAR Hold] acetaminophen (TYLENOL) tablet 650 mg  650 mg Oral Q6H PRN Modena Slater, DO       Or   [MAR Hold] acetaminophen (TYLENOL) suppository 650 mg  650 mg Rectal Q6H PRN Modena Slater, DO       [MAR Hold] dapagliflozin propanediol (FARXIGA) tablet 10 mg  10 mg Oral q AM Modena Slater, DO       [facility] Hold] gabapentin (NEURONTIN) capsule 400 mg  400 mg Oral q AM Modena Slater, DO   400 mg at 08/10/23 0643   And   [MAR Hold] gabapentin (NEURONTIN) capsule 400 mg  400 mg Oral Q1500 Modena Slater, DO       And   [facility] Hold] gabapentin (NEURONTIN) capsule 800 mg  800 mg Oral QHS Modena Slater, DO   800 mg at 08/09/23 2210   [MAR Hold] HYDROmorphone (DILAUDID) injection 0.5-1 mg  0.5-1 mg Intravenous Q4H PRN Modena Slater, DO        [MAR Hold] insulin aspart (novoLOG) injection 0-9 Units  0-9 Units Subcutaneous TID WC Modena Slater, DO       [MAR Hold] ondansetron (ZOFRAN) injection 4 mg  4 mg Intravenous Q8H PRN Modena Slater, DO       [MAR Hold] pantoprazole (PROTONIX) injection 40 mg  40 mg Intravenous Q12H Esterwood, Amy S, PA-C   40 mg at 08/09/23 2210   [MAR Hold] polyethylene glycol (MIRALAX / GLYCOLAX) packet 17 g  17  g Oral Daily PRN Modena Slater, DO       [facility] Hold] rosuvastatin (CRESTOR) tablet 10 mg  10 mg Oral q AM Modena Slater, DO   10 mg at 08/10/23 2952    Allergies as of 08/09/2023 - Review Complete 08/09/2023  Allergen Reaction Noted   Ciprofloxacin Hives and Swelling 12/25/2011   Morphine and codeine Other (See Comments) 12/25/2011   Penicillins Hives and Swelling    Codeine Other (See Comments) 11/21/2021   Lisinopril Cough 01/04/2016   Feraheme [ferumoxytol] Itching 11/17/2012    Family History  Problem Relation Age of Onset   Cancer Father        unknown   Hypertension Mother    Diverticulitis Mother     Social History   Socioeconomic History   Marital status: Widowed    Spouse name: Not on file   Number of children: 3   Years of education: Not on file   Highest education level: Not on file  Occupational History   Occupation: Retired  Tobacco Use   Smoking status: Some Days    Current packs/day: 0.50    Average packs/day: 0.5 packs/day for 17.0 years (8.5 ttl pk-yrs)    Types: Cigarettes   Smokeless tobacco: Never   Tobacco comments:    1 pack last 1 month  Vaping Use   Vaping status: Never Used  Substance and Sexual Activity   Alcohol use: Yes    Comment: few drinks a week   Drug use: Yes    Types: Cocaine, Marijuana    Comment: last used about 2 weeks cocaine and marijuana   Sexual activity: Yes    Partners: Male    Birth control/protection: Post-menopausal  Other Topics Concern   Not on file  Social History Narrative   Not on file   Social Drivers of Health    Financial Resource Strain: Not on file  Food Insecurity: No Food Insecurity (08/09/2023)   Hunger Vital Sign    Worried About Running Out of Food in the Last Year: Never true    Ran Out of Food in the Last Year: Never true  Transportation Needs: No Transportation Needs (08/09/2023)   PRAPARE - Administrator, Civil Service (Medical): No    Lack of Transportation (Non-Medical): No  Physical Activity: Not on file  Stress: Not on file  Social Connections: Moderately Isolated (08/09/2023)   Social Connection and Isolation Panel [NHANES]    Frequency of Communication with Friends and Family: More than three times a week    Frequency of Social Gatherings with Friends and Family: More than three times a week    Attends Religious Services: More than 4 times per year    Active Member of [employer] or Organizations: No    Attends Banker Meetings: Never    Marital Status: Widowed  Intimate Partner Violence: Not At Risk (08/09/2023)   Humiliation, Afraid, Rape, and Kick questionnaire    Fear of Current or Ex-Partner: No    Emotionally Abused: No    Physically Abused: No    Sexually Abused: No    Review of Systems:  All other review of systems negative except as mentioned in the HPI.  Physical Exam: Vital signs BP (!) 142/74   Pulse 77   Temp 98 F (36.7 C) (Temporal)   Resp 10   SpO2 90%   General:   Alert,  Well-developed, well-nourished, pleasant and cooperative in NAD Lungs:  Clear throughout to auscultation.  Heart:  Regular rate and rhythm; no murmurs, clicks, rubs,  or gallops. Abdomen:  Soft, nontender and nondistended. Normal bowel sounds.   Neuro/Psych:  Normal mood and affect. A and O x 3  Maren Beach, MD [facility]

## 2023-08-10 NOTE — Anesthesia Preprocedure Evaluation (Addendum)
 Anesthesia Evaluation  Patient identified by MRN, date of birth, ID band Patient awake    Reviewed: Allergy & Precautions, NPO status , Patient's Chart, lab work & pertinent test results  History of Anesthesia Complications Negative for: history of anesthetic complications  Airway Mallampati: II  TM Distance: >3 FB Neck ROM: Full    Dental  (+) Upper Dentures, Lower Dentures   Pulmonary Current Smoker and Patient abstained from smoking.   Pulmonary exam normal        Cardiovascular hypertension, Pt. on medications and Pt. on home beta blockers + Peripheral Vascular Disease  Normal cardiovascular exam   '19 TTE - EF 50-55%. Moderate concentric LVH. Grade 1 diastolic dysfunction. Mild AI. Mildly dilated LA. Mild TR.    Neuro/Psych  PSYCHIATRIC DISORDERS Anxiety Depression    CVA (2010), No Residual Symptoms    GI/Hepatic ,GERD  Medicated and Controlled,,(+)     substance abuse (cocaine 2 weeks ago)  alcohol use, cocaine use and marijuana use, Hepatitis -, C  Endo/Other  diabetes, Type 2, Oral Hypoglycemic Agents    Renal/GU Renal InsufficiencyRenal disease (Cr 2.58)     Musculoskeletal  (+) Arthritis ,    Abdominal   Peds  Hematology  (+) Blood dyscrasia, anemia   Anesthesia Other Findings   Reproductive/Obstetrics                             Anesthesia Physical Anesthesia Plan  ASA: 3  Anesthesia Plan: MAC   Post-op Pain Management: Minimal or no pain anticipated   Induction:   PONV Risk Score and Plan: 1 and Propofol infusion and Treatment may vary due to age or medical condition  Airway Management Planned: Natural Airway and Nasal Cannula  Additional Equipment: None  Intra-op Plan:   Post-operative Plan:   Informed Consent: I have reviewed the patients History and Physical, chart, labs and discussed the procedure including the risks, benefits and alternatives for the  proposed anesthesia with the patient or authorized representative who has indicated his/her understanding and acceptance.       Plan Discussed with: CRNA and Anesthesiologist  Anesthesia Plan Comments:        Anesthesia Quick Evaluation

## 2023-08-10 NOTE — Progress Notes (Signed)
  Overnight events: None  Subjective: Doing a little better this morning. Having some nausea and emesis this morning which may be a reaction to her pain medication. Emesis is clear fluid. Still reports some pain in the right wrist.   Objective:  Vital signs in last 24 hours: Vitals:   08/10/23 0016 08/10/23 0426 08/10/23 0747 08/10/23 1030  BP: 120/77 107/70 (!) 120/95 (!) 142/74  Pulse: 76 87 86 77  Resp: 19 19 16 10   Temp: 99 F (37.2 C) 99.2 F (37.3 C) 98.7 F (37.1 C) 98 F (36.7 C)  TempSrc: Oral Oral  Temporal  SpO2: 96% 93% 92% 90%   Physical Exam: Constitutional: Alert, nauseous, vomiting clear emesis  Cardio: Regular rate and rhythm Pulm: Normal respiratory rate and effort Neuro: Alert and oriented x 3, no focal deficits MSK: right wrist in cast  Labs: Cr 1.73 Hemoglobin 9.8  Assessment/Plan: Active Problems:   Essential hypertension, benign   GERD   Generalized anxiety disorder   Tobacco use disorder   Hyperlipidemia   Stage 3b chronic kidney disease (CKD) (HCC)   GI bleed  GI bleed History of duodenal AVM Chronic iron deficiency anemia Hemoglobin stable at 9.8. No new bleeding. NPO for enteroscopy today.  - GI following, appreciate assistance; enteroscopy today - Trend CBC - IV Protonix; PPI at discharge - Advance diet as tolerated   Right arm pain S/p hardware removal surgery Pain improved. Spaced out Dilaudid to q4h prn. Zofran prn for nausea.    Chronic conditions: HTN: Normotensive. Held home BP meds (losartan, carvedilol, amlodipine).  Prediabetes: A1c 5.5 one month ago. Restarted Farxiga 10 mg, gabapentin. SSI. CBG monitoring. Hx Prolonged QT interval: Qtc 423 on 3/12.  Tobacco use: 8.5 pack per day smoking history.  Polysubstance use: Cocaine, marijuana, alcohol use.  GERD: Famotidine, omeprazole, held. Gout: held allopurinol.  Hyperlipidemia: LDL at goal one month ago.  CKD stage 3b: Cr 2.10, GFR 25.   Diet: Normal IVF: None VTE:  Enoxaparin Code: Full  Dispo: Anticipated discharge to  Home pending medical stability and safe dispo plan.   Annett Fabian, MD 08/10/2023, 10:52 AM Pager: [phone] After 5pm on weekdays and 1pm on weekends: On Call pager [phone]

## 2023-08-10 NOTE — Transfer of Care (Signed)
 Immediate Anesthesia Transfer of Care Note  Patient: [name]  Procedure(s) Performed: ENTEROSCOPY EGD, WITH ARGON PLASMA COAGULATION  Patient Location: PACU  Anesthesia Type:MAC  Level of Consciousness: awake, drowsy, patient cooperative, and responds to stimulation  Airway & Oxygen Therapy: Patient Spontanous Breathing and Patient connected to nasal cannula oxygen  Post-op Assessment: Report given to RN and Post -op Vital signs reviewed and stable  Post vital signs: Reviewed and stable  Last Vitals:  Vitals Value Taken Time  BP 108/85 08/10/23 1132  Temp 36.9 C 08/10/23 1132  Pulse 77 08/10/23 1132  Resp 11 08/10/23 1132  SpO2 95 % 08/10/23 1132  Vitals shown include unfiled device data.  Last Pain:  Vitals:   08/10/23 1132  TempSrc:   PainSc: 0-No pain         Complications: No notable events documented.

## 2023-08-11 DIAGNOSIS — D509 Iron deficiency anemia, unspecified: Secondary | ICD-10-CM | POA: Diagnosis not present

## 2023-08-11 DIAGNOSIS — D62 Acute posthemorrhagic anemia: Secondary | ICD-10-CM

## 2023-08-11 DIAGNOSIS — K31819 Angiodysplasia of stomach and duodenum without bleeding: Secondary | ICD-10-CM | POA: Diagnosis not present

## 2023-08-11 DIAGNOSIS — K922 Gastrointestinal hemorrhage, unspecified: Secondary | ICD-10-CM | POA: Diagnosis not present

## 2023-08-11 DIAGNOSIS — K921 Melena: Secondary | ICD-10-CM | POA: Diagnosis not present

## 2023-08-11 LAB — CBC
HCT: 27.8 % — ABNORMAL LOW (ref 36.0–46.0)
Hemoglobin: 8.5 g/dL — ABNORMAL LOW (ref 12.0–15.0)
MCH: 26.6 pg (ref 26.0–34.0)
MCHC: 30.6 g/dL (ref 30.0–36.0)
MCV: 86.9 fL (ref 80.0–100.0)
Platelets: 307 10*3/uL (ref 150–400)
RBC: 3.2 MIL/uL — ABNORMAL LOW (ref 3.87–5.11)
RDW: 22.1 % — ABNORMAL HIGH (ref 11.5–15.5)
WBC: 6.9 10*3/uL (ref 4.0–10.5)
nRBC: 0 % (ref 0.0–0.2)

## 2023-08-11 LAB — GLUCOSE, CAPILLARY
Glucose-Capillary: 172 mg/dL — ABNORMAL HIGH (ref 70–99)
Glucose-Capillary: 127 mg/dL — ABNORMAL HIGH (ref 70–99)

## 2023-08-11 LAB — HEMOGLOBIN AND HEMATOCRIT, BLOOD
HCT: 29.2 % — ABNORMAL LOW (ref 36.0–46.0)
Hemoglobin: 8.8 g/dL — ABNORMAL LOW (ref 12.0–15.0)

## 2023-08-11 MED ORDER — ACETAMINOPHEN 650 MG RE SUPP: 650.0000 mg | Freq: Four times a day (QID) | RECTAL | Status: DC

## 2023-08-11 MED ORDER — OXYCODONE HCL 5 MG PO TABS
5.0000 mg | ORAL_TABLET | ORAL | Status: DC
  Administered 2023-08-11 (×2): 5 mg via ORAL
  Filled 2023-08-11 (×2): qty 1

## 2023-08-11 MED ORDER — ACETAMINOPHEN 325 MG PO TABS
650.0000 mg | ORAL_TABLET | Freq: Four times a day (QID) | ORAL | Status: DC
  Administered 2023-08-11 (×2): 650 mg via ORAL
  Filled 2023-08-11 (×2): qty 2

## 2023-08-11 NOTE — Discharge Instructions (Addendum)
 [name], It was a pleasure taking care of you at [facility].  You are admitted for GI bleeding, and had imaging with enteroscopy.  Now that you are stable we are discharging home. Please follow the following instructions.   1 For your pain continue taking hydrocodone at home.  Since you just had surgery, it will take some time for your pain to go away.  This should help.  2) Continue taking your acid reflux medicine at home.  If you notice any other stools that looked black, tarry, or bloody, please call the clinic at [phone].  3) Continue taking all of your other medications as instructed.  4) Please follow-up in the clinic for hospital follow-up.  I will call my clinic to make sure you have an appointment.  Take care,  Dr. Modena Slater, DO

## 2023-08-11 NOTE — Progress Notes (Signed)
 PT Cancellation Note  Patient Details Name: [name] MRN: [id number] DOB: 1951/04/19   Cancelled Treatment:    Reason Eval/Treat Not Completed: Other (comment), pt requesting to eat prior to therapy evaluations. Will return later this afternoon for evaluation.   Vickki Muff, PT, DPT   Acute Rehabilitation Department Office [phone] Secure Chat Communication Preferred    [name] 08/11/2023, 12:50 PM

## 2023-08-11 NOTE — Evaluation (Signed)
 Occupational Therapy Evaluation Patient Details Name: [name] MRN: [id number] DOB: 1951-05-13 Today's Date: 08/11/2023   History of Present Illness   73 y.o. female presenting 3/14 with dark stools. Of note, surgery 2 days prior for removal of 3 screws R carpus. 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 Previously transferring via SPT or slide board to/from her wheelchair; does notn wear prosthesis often. Upon eval, pt with RUE edema and decreased ROM of digits; have recommended OP OT as pt with decr knowledge of progression of hand and pt very hesitant to mobilize hand. Pt performing transfers and BADL with mod I. Will continue to follow acutely.    If plan is discharge home, recommend the following:   Other (comment) (on pt request)     Functional Status Assessment   Patient has had a recent decline in their functional status and demonstrates the ability to make significant improvements in function in a reasonable and predictable amount of time.     Equipment Recommendations   Other (comment) (discussed recommendation/pt concerns with DO. Pt has BSC that has cushioned seat and would like a new one. DO follows on OP basis.)     Recommendations for Other Services         Precautions/Restrictions   Precautions Precautions: Fall Recall of Precautions/Restrictions: Intact Precaution/Restrictions Comments: L AKA (prosthetic at home) Restrictions Weight Bearing Restrictions Per Provider Order: No     Mobility Bed Mobility Overal bed mobility: Independent                  Transfers Overall transfer level: Independent Equipment used: None               General transfer comment: pt completed stand-pivot transfer with BUE support and use of LLE AKA stump on chair for balance       Balance Overall balance assessment: No apparent balance deficits (not formally assessed)                                         ADL either performed or assessed with clinical judgement   ADL Overall ADL's : Needs assistance/impaired Eating/Feeding: Modified independent;Sitting   Grooming: Sitting;Set up   Upper Body Bathing: Modified independent;Sitting   Lower Body Bathing: Modified independent;Sit to/from stand   Upper Body Dressing : Modified independent;Sitting   Lower Body Dressing: Modified independent;Sit to/from stand   Toilet Transfer: Modified Independent;Stand-pivot           Functional mobility during ADLs: Modified independent (for pivot to/from chair)       Vision   Vision Assessment?: No apparent visual deficits     Perception Perception: Not tested       Praxis Praxis: Not tested       Pertinent Vitals/Pain       Extremity/Trunk Assessment Upper Extremity Assessment Upper Extremity Assessment: RUE deficits/detail RUE Deficits / Details: in sling and splint from recent surgery, fingers swollen and only able to move pasively; lacking composite flexion ~2cm from [facility]   Lower Extremity Assessment Lower Extremity Assessment: RLE deficits/detail;LLE deficits/detail RLE Deficits / Details: grossly functional, cuts on pt shin but she was unable to explain how she sustained them RLE Sensation: WNL RLE Coordination:  WNL LLE Deficits / Details: hx fo AKA, good ROM, functional strength   Cervical / Trunk Assessment Cervical / Trunk Assessment: Normal   Communication Communication Communication: No apparent difficulties   Cognition Arousal: Alert Behavior During Therapy: Agitated, Impulsive Cognition: No apparent impairments             OT - Cognition Comments: able to provide specific dates from hand surgery and admission here. follows commands WFL when intrinsically motivated to do so. tangential but feel this is her  baseline.                 Following commands: Intact       Cueing  General Comments   Cueing Techniques: Verbal cues  VSS on RA, pt reports concern about bleeding; DO notified   Exercises Exercises: Hand exercises Hand Exercises Digit Composite Flexion: PROM, Right, 5 reps Composite Extension: PROM, Right, 5 reps   Shoulder Instructions      Home Living Family/patient expects to be discharged to:: Private residence Living Arrangements: Alone Available Help at Discharge: Family Type of Home: House Home Access: Stairs to enter [employer] of Steps: level in back, 2 in front Entrance Stairs-Rails: None Home Layout: One level     Bathroom Shower/Tub: Chief Strategy Officer: Standard Bathroom Accessibility: Yes   Home Equipment: Toilet riser;Wheelchair - Gaffer seat;Hand held shower head   Additional Comments: pt reports she needs a new BSC and wheelchair      Prior Functioning/Environment Prior Level of Function : Independent/Modified Independent             Mobility Comments: pt reports independence with WC, transfers with slideboard or pivot ADLs Comments: independent    OT Problem List: Decreased strength;Decreased range of motion;Impaired UE functional use;Increased edema   OT Treatment/Interventions:        OT Goals(Current goals can be found in the care plan section)   Acute Rehab OT Goals OT Goal Formulation: With patient Time For Goal Achievement: 08/25/23 Potential to Achieve Goals: Good   OT Frequency:       Co-evaluation              AM-PAC OT "6 Clicks" Daily Activity     Outcome Measure                 End of Session Equipment Utilized During Treatment:  (RUE sling) Nurse Communication: Mobility status  Activity Tolerance: Patient tolerated treatment well Patient left: in bed;with call bell/phone within reach;with family/visitor present;Other (comment) (DO  in room)  OT Visit Diagnosis: Unsteadiness on feet (R26.81);Muscle weakness (generalized) (M62.81)                Time: 4098-1191 OT Time Calculation (min): 33 min Charges:  OT General Charges $OT Visit: 1 Visit OT Evaluation $OT Eval Low Complexity: 1 Low  [name], OTR/L [facility] Acute Rehabilitation Office: [phone]   [name] 08/11/2023, 3:09 PM

## 2023-08-11 NOTE — Progress Notes (Signed)
 OT Cancellation Note  Patient Details Name: KEILANI TERRANCE MRN: [id number] DOB: 02/22/1951   Cancelled Treatment:    Reason Eval/Treat Not Completed: Other (comment) (Pt just received lunch tray on arrival, requesting OT return at a later time. Will follow up as time and schedule allow.)  Tyler Deis, OTR/L [facility] Acute Rehabilitation Office: [phone]   Myrla Halsted 08/11/2023, 12:56 PM

## 2023-08-11 NOTE — Evaluation (Signed)
 Physical Therapy Evaluation Patient Details Name: [name] MRN: [id number] DOB: September 13, 1950 Today's Date: 08/11/2023  History of Present Illness  73 y.o. female presenting 3/14 with dark stools. Of note, surgery 2 days prior for removal of 3 screws R carpus. 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    Clinical Impression  Pt in bed upon arrival of PT, agreeable to evaluation at this time. Prior to admission the pt was able to complete transfers to and from [facility] independently with use of slideboard or pivot, used her manual WC in the home and electric scooter for community mobility. The pt was able to demo good independence with all bed mobility and transfers at this time, reports her mobility is at her baseline. The pt will be safe to return home with intermittent support from her son, may benefit from [facility] evaluation at next follow up appointment with PCP.      If plan is discharge home, recommend the following: Assistance with cooking/housework;Assist for transportation;Help with stairs or ramp for entrance   Can travel by private vehicle        Equipment Recommendations Other (comment) (pt asking about new WC and BSC, wants a different one than our vendor supplies so showed her how to find the one she wants on [place])  Recommendations for Other Services       Functional Status Assessment Patient has had a recent decline in their functional status and demonstrates the ability to make significant improvements in function in a reasonable and predictable amount of time.     Precautions / Restrictions Precautions Precautions: Fall Recall of Precautions/Restrictions: Intact Precaution/Restrictions Comments: L AKA (prosthetic at home) Restrictions Weight Bearing Restrictions Per Provider Order: No      Mobility  Bed Mobility Overal bed mobility: Independent                   Transfers Overall transfer level: Independent Equipment used: None               General transfer comment: pt completed stand-pivot transfer with BUE support and use of LLE AKA stump on chair for balance    Ambulation/Gait               General Gait Details: deferred as pt is not ambulatory at baseline without prosthetic (has WC and needed DME to mobilize)     Balance Overall balance assessment: No apparent balance deficits (not formally assessed)                                           Pertinent Vitals/Pain Pain Assessment Pain Assessment: No/denies pain    Home Living Family/patient expects to be discharged to:: Private residence Living Arrangements: Alone Available Help at Discharge: Family Type of Home: House Home Access: Stairs to enter Entrance Stairs-Rails: None Entrance Stairs-Number of Steps: level in back, 2 in front   Home Layout: One level Home Equipment: Toilet riser;Wheelchair - Gaffer seat;Hand held shower head Additional Comments: pt reports she needs a new BSC and wheelchair    Prior Function Prior Level of Function : Independent/Modified Independent             Mobility Comments: pt reports independence with WC, transfers with slideboard or pivot ADLs Comments: independent  Extremity/Trunk Assessment   Upper Extremity Assessment Upper Extremity Assessment: Defer to OT evaluation;RUE deficits/detail RUE Deficits / Details: in sling and splint from recent surgery, fingers swollen and only able to move pasively    Lower Extremity Assessment Lower Extremity Assessment: RLE deficits/detail;LLE deficits/detail RLE Deficits / Details: grossly functional, cuts on pt shin but she was unable to explain how she sustained them RLE Sensation: WNL RLE Coordination: WNL LLE Deficits / Details: hx fo AKA, good ROM, functional strength    Cervical / Trunk Assessment Cervical / Trunk  Assessment: Normal  Communication   Communication Communication: No apparent difficulties    Cognition Arousal: Alert Behavior During Therapy: Agitated, Impulsive                           PT - Cognition Comments: pt tangential, easily distracted, and needing cues to answer questions. able to demo mobiltiy safely and oriented, not fully assessed Following commands: Intact       Cueing Cueing Techniques: Verbal cues     General Comments General comments (skin integrity, edema, etc.): VSS on RA, pt reports concern about bleeding    Exercises Hand Exercises Digit Composite Flexion: PROM, Right, 5 reps Composite Extension: PROM, Right, 5 reps   Assessment/Plan    PT Assessment Patient does not need any further PT services         PT Goals (Current goals can be found in the Care Plan section)  Acute Rehab PT Goals Patient Stated Goal: to return home PT Goal Formulation: With patient Time For Goal Achievement: 08/18/23 Potential to Achieve Goals: Good     AM-PAC PT "6 Clicks" Mobility  Outcome Measure Help needed turning from your back to your side while in a flat bed without using bedrails?: None Help needed moving from lying on your back to sitting on the side of a flat bed without using bedrails?: None Help needed moving to and from a bed to a chair (including a wheelchair)?: None Help needed standing up from a chair using your arms (e.g., wheelchair or bedside chair)?: None Help needed to walk in hospital room?: Total Help needed climbing 3-5 steps with a railing? : Total 6 Click Score: 18    End of Session Equipment Utilized During Treatment: Other (comment) (RUE sling) Activity Tolerance: Patient tolerated treatment well Patient left: in bed;with call bell/phone within reach;with family/visitor present Nurse Communication: Mobility status PT Visit Diagnosis: Unsteadiness on feet (R26.81)    Time: 0981-1914 PT Time Calculation (min) (ACUTE ONLY): 28  min   Charges:   PT Evaluation $PT Eval Low Complexity: 1 Low   PT General Charges $$ ACUTE PT VISIT: 1 Visit         Vickki Muff, PT, DPT   Acute Rehabilitation Department Office [phone] Secure Chat Communication Preferred  [name] 08/11/2023, 2:42 PM

## 2023-08-11 NOTE — Progress Notes (Signed)
 Patient ID: [name], female   DOB: 03-20-1951, 73 y.o.   MRN: [id number]    Progress Note   Subjective   Day # 2 CC; melena, anemia  Enteroscopy yesterday-small hiatal hernia, multiple angioectasias with no bleeding found in the duodenal bulb and the second portion of the duodenum and in the third portion of the duodenum.  These were treated with APC, no pathology found in the jejunum.  Patient says she does not feel good today, her hand is hurting, her breakfast was brought and she is having some mild burning discomfort in her abdomen.  She wants to go home.  She has not had any bowel movements/melena since procedure yesterday  WBC 6.9/hemoglobin 8.5/hematocrit 27.8-down from 9.1 yesterday   Objective   Vital signs in last 24 hours: Temp:  [98.5 F (36.9 C)-99.7 F (37.6 C)] 98.5 F (36.9 C) (03/16 0546) Pulse Rate:  [76-88] 88 (03/16 0756) Resp:  [11-20] 16 (03/16 0756) BP: (107-131)/(64-85) 110/64 (03/16 0756) SpO2:  [93 %-99 %] 93 % (03/16 0756) Last BM Date : 08/09/23 General:    Older African-American female in NAD Heart:  Regular rate and rhythm; no murmurs Lungs: Respirations even and unlabored, lungs CTA bilaterally Abdomen:  Soft, mildly tender in the hypogastrium, no guarding or rebound bowel sounds are present. Normal bowel sounds. Extremities: Left upper extremity casted Neurologic:  Alert and oriented,  grossly normal neurologically. Psych:  Cooperative. Normal mood and affect.  Intake/Output from previous day: 03/15 0701 - 03/16 0700 In: 350 [I.V.:350] Out: -  Intake/Output this shift: No intake/output data recorded.  Lab Results: Recent Labs    08/09/23 1230 08/09/23 1619 08/10/23 0705 08/10/23 0902 08/11/23 0426  WBC 6.1  --  4.7  --  6.9  HGB 9.4*   < > 9.1* 9.8* 8.5*  HCT 30.4*   < > 28.8* 31.2* 27.8*  PLT 300  --  271  --  307   < > = values in this interval not displayed.   BMET Recent Labs    08/09/23 1230 08/10/23 0705  NA 135  138  K 5.1 4.9  CL 107 110  CO2 19* 19*  GLUCOSE 119* 115*  BUN 38* 33*  CREATININE 2.10* 1.73*  CALCIUM 8.2* 8.4*   LFT Recent Labs    08/09/23 1230  PROT 7.0  ALBUMIN 3.4*  AST 26  ALT 13  ALKPHOS 65  BILITOT 0.4   PT/INR No results for input(s): "LABPROT", "INR" in the last 72 hours.    Assessment / Plan:    #56 73 year old female with history of recurrent GI bleeding secondary to AVMs, who was admitted 2 days ago with complaints of melena.  Enteroscopy yesterday with several nonbleeding AVMs found in the duodenum, all treated with APC.  Patient has had a drift in hemoglobin but has not had any bowel movements or melena since the procedure yesterday  She is complaining of some burning in the upper abdomen which may be secondary to APC yesterday  #2 anemia-acute and chronic/iron deficiency-followed by hematology and secondary to intermittent chronic blood loss from AVMs as above.  In addition has chronic kidney disease which may be contributing  #3 chronic kidney disease #4.  Hypertension #5.  Diabetes mellitus with neuropathy #6.  Substance abuse  Plan; will check repeat hemoglobin at 1 PM, if hemoglobin stable and patient should be okay for discharge to home today from GI perspective. No aspirin or NSAIDs She should have a follow-up CBC in  a week through primary care       Active Problems:   Essential hypertension, benign   GERD   Generalized anxiety disorder   Tobacco use disorder   Angiectasia of gastrointestinal tract   Hyperlipidemia   Melena   Stage 3b chronic kidney disease (CKD) (HCC)   GI bleed   Anemia     LOS: 0 days   Jackalynn Art PA-C 08/11/2023, 10:41 AM

## 2023-08-11 NOTE — Plan of Care (Signed)

## 2023-08-11 NOTE — Discharge Summary (Addendum)
 Name: [name] MRN: [id number] DOB: [birth year] 73 y.o. PCP: Katheran James, DO  Date of Admission: 08/09/2023 11:52 AM Date of Discharge: 08/11/2023 Attending Physician: Dr. Cleda Daub  Discharge Diagnosis: Active Problems:   Essential hypertension, benign   GERD   Generalized anxiety disorder   Tobacco use disorder   Angiectasia of gastrointestinal tract   Hyperlipidemia   Melena   Stage 3b chronic kidney disease (CKD) (HCC)   GI bleed   Anemia    Discharge Medications: Allergies as of 08/11/2023       Reactions   Ciprofloxacin Hives, Swelling   Morphine And Codeine Other (See Comments)   Makes pt sad and paranoid   Penicillins Hives, Swelling   Has patient had a PCN reaction causing immediate rash, facial/tongue/throat swelling, SOB or lightheadedness with hypotension:YES Has patient had a PCN reaction causing severe rash involving mucus membranes or skin necrosis:UNSURE Has patient had a PCN reaction that required hospitalization:YES Has patient had a PCN reaction occurring within the last 10 years:No If all of the above answers are "NO", then may proceed with Cephalosporin use.   Codeine Other (See Comments)   sweating per pt (not hives/swelling)   Lisinopril Cough   New onset dry cough after starting lisinopril. Resolved upon switching to losartan.   Feraheme [ferumoxytol] Itching   Tolerated Feraheme 6/26 & 7/21 pre-medications prior to medication        Medication List     STOP taking these medications    omeprazole 20 MG capsule Commonly known as: PRILOSEC       TAKE these medications    Accu-Chek FastClix Lancets Misc Use to test blood glucose 3 times daily.   Accu-Chek Guide test strip Generic drug: glucose blood Use as instructed   Accu-Chek Guide w/Device Kit 1 each by Does not apply route 3 (three) times daily. The patient is insulin requiring, ICD 10 code E11.65. The patient tests 3 times a day.   acetaminophen 500 MG  tablet Commonly known as: TYLENOL Take 1,000 mg by mouth every 6 (six) hours as needed for mild pain (pain score 1-3) or moderate pain (pain score 4-6).   allopurinol 100 MG tablet Commonly known as: Zyloprim Take 1/2 tablet (50 mg total) by mouth daily.  Begin medication after one week of colchicine treatment. What changed:  when to take this reasons to take this   amLODipine 10 MG tablet Commonly known as: NORVASC Take 1 tablet (10 mg total) by mouth daily. What changed: when to take this   calcium-vitamin D 500-200 MG-UNIT tablet Commonly known as: OSCAL WITH D Take 1 tablet by mouth 2 (two) times daily.   camphor-menthol lotion Commonly known as: SARNA Apply 1 Application topically as needed for itching.   carvedilol 3.125 MG tablet Commonly known as: Coreg Take 1 tablet (3.125 mg total) by mouth 2 (two) times daily.   famotidine 20 MG tablet Commonly known as: PEPCID Take 1 tablet (20 mg total) by mouth 2 (two) times daily. What changed:  when to take this reasons to take this   Farxiga 10 MG Tabs tablet Generic drug: dapagliflozin propanediol Take 1 tablet by mouth daily. What changed: when to take this   gabapentin 400 MG capsule Commonly known as: NEURONTIN Take 1 capsule (400 mg total) by mouth in the morning AND 1 capsule (400 mg total) daily in the afternoon AND 2 capsules (800 mg total) before bedtime.   HYDROcodone-acetaminophen 5-325 MG tablet Commonly known as: NORCO/VICODIN Take  1-2 tablets by mouth every 6 (six) hours as needed for pain.   losartan 100 MG tablet Commonly known as: COZAAR Take 1 tablet (100 mg total) by mouth daily. What changed: when to take this   pantoprazole 40 MG tablet Commonly known as: Protonix Take 1 tablet (40 mg total) by mouth daily.   rosuvastatin 10 MG tablet Commonly known as: CRESTOR Take 1 tablet (10 mg total) by mouth in the morning. What changed:  medication strength how much to take when to take this    Trulicity 0.75 MG/0.5ML Soaj Generic drug: Dulaglutide Inject 0.75 mg into the skin once a week. What changed: additional instructions        Disposition and follow-up:   [name] was discharged from [facility] in Good condition.  At the hospital follow up visit please address:  1.  Follow-up:  a.  GI bleed: Follow-up CBC outpatient.  Ensure no more melanotic stools    b.  Right arm pain: Ensure patient has adequate pain control from previous surgery  2.  Labs / imaging needed at time of follow-up: CBC  3.  Pending labs/ test needing follow-up: N/A  4.  Medication Changes  Discharged on Protonix 40 mg daily  Follow-up Appointments:  Follow-up Information     Katheran James, DO. Call in 1 day(s).   Specialty: Internal Medicine Why: To make appointment for Hospital follow up Contact information: [address] [place] [address] [phone]                 Hospital Course by problem list: [name] is a 73 y.o. female with a history of duodenal AVMs, hypertension, tobacco use disorder, CKD stage IIIb who presents to the emergency department with concerns of GI bleed.  Patient admitted for further evaluation and management   #Upper GI bleed #History of duodenal AVM #Chronic iron deficiency anemia Patient initially presented to the emergency department with concerns of melanotic stools.  Patient was found to have hemoglobin of 9.4.  It did decline down to 8.5 during hospital course.  Patient remained hemodynamically stable.  Patient had enteroscopy which did show multiple areas which likely were previously bleeding but no active bleeds.  During enteroscopy, patient had intervention with coagulation.  Hemoglobin did stabilize.  She remained hemodynamically stable.  During hospital course, patient transition to Protonix p.o.  GI signed off.  Patient tolerated diet well prior to discharge.  On discharge day, she noted that she is  having some bloody stools. However, nurse and I both saw her bowel movement and noted there is no melena and there is no blood. Patient is adamant about staying in the hospital but I did not see a medical reason for patient to remain the hospital. Her hemoglobin was stable and she was able to get her medications delivered to her room. She also worked with PT/OT and did well. I did inform her that if she did not agree with the discharge she could appeal the discharge. From my standpoint she is medically stable to discharge.    #Right arm pain Patient continuously had right arm pain during hospitalization.  No signs or symptoms of infection.  Pain controlled with Dilaudid initially before enteroscopy.  After enteroscopy, patient will transition to p.o. oxycodone which patient tolerated well.  Per charting, patient does have hydrocodone at home which she can resume at home.  Patient encouraged to continue to take Tylenol at home.  Patient worked with PT/OT  and did well.    Chronic conditions:   #Hypertension Patient remained normotensive during hospitalization.  Can resume home losartan, carvedilol, amlodipine at discharge.   #Prediabetes Patient continued Marcelline Deist during hospitalization.  Can continue at home.  #Tobacco use disorder During hospitalization, encourage patient to quit tobacco use.   #GERD Patient had enteroscopy during hospitalization.  Continued IV PPI during hospitalization.  Can resume PPI at home.   #CKD stage IIIb Creatinine remained at baseline during hospitalization.  #Gout Held allopurinol during hospitalization.  Can resume at home.   Discharge Subjective:  Patient is evaluated at bedside. Reports that she is in pain. Denies any hematochezia or melena.  She feels that her pain is not being well-managed.  I reassured her that we are giving her medications for this.  She denies any nausea or vomiting today.  She denies any bowel movements.   Discharge Exam:   BP 110/64  (BP Location: Left Arm)   Pulse 88   Temp 98.5 F (36.9 C)   Resp 16   SpO2 93%  Constitutional: Resting in bed, no acute distress Cardiovascular: regular rate and rhythm, no m/r/g Pulmonary/Chest: normal work of breathing on room air, lungs clear to auscultation bilaterally Abdomen: Tenderness noted throughout entire abdomen, normal active bowel sounds, no distention  Pertinent Labs, Studies, and Procedures:     Latest Ref Rng & Units 08/11/2023    1:47 PM 08/11/2023    4:26 AM 08/10/2023    9:02 AM  CBC  WBC 4.0 - 10.5 K/uL  6.9    Hemoglobin 12.0 - 15.0 g/dL 8.8  8.5  9.8   Hematocrit 36.0 - 46.0 % 29.2  27.8  31.2   Platelets 150 - 400 K/uL  307         Latest Ref Rng & Units 08/10/2023    7:05 AM 08/09/2023   12:30 PM 08/07/2023    3:00 PM  CMP  Glucose 70 - 99 mg/dL 161  096  045   BUN 8 - 23 mg/dL 33  38  54   Creatinine 0.44 - 1.00 mg/dL 4.09  8.11  9.14   Sodium 135 - 145 mmol/L 138  135  132   Potassium 3.5 - 5.1 mmol/L 4.9  5.1  4.9   Chloride 98 - 111 mmol/L 110  107  103   CO2 22 - 32 mmol/L 19  19  16    Calcium 8.9 - 10.3 mg/dL 8.4  8.2  8.1   Total Protein 6.5 - 8.1 g/dL  7.0  6.6   Total Bilirubin 0.0 - 1.2 mg/dL  0.4  <7.8   Alkaline Phos 38 - 126 U/L  65  66   AST 15 - 41 U/L  26  26   ALT 0 - 44 U/L  13  21     No results found.   Discharge Instructions: Discharge Instructions     Ambulatory referral to Occupational Therapy   Complete by: As directed    Diet - low sodium heart healthy   Complete by: As directed    Discharge instructions   Complete by: As directed    [name], It was a pleasure taking care of you at [facility].  You are admitted for GI bleeding, and had imaging with enteroscopy.  Now that you are stable we are discharging home. Please follow the following instructions.   1 For your pain continue taking hydrocodone at home.  Since you just had surgery, it  will take some time for your pain to go away.  This should  help.  2) Continue taking your acid reflux medicine at home.  If you notice any other stools that looked black, tarry, or bloody, please call the clinic at [phone].  3) Continue taking all of your other medications as instructed.  4) Please follow-up in the clinic for hospital follow-up.  I will call my clinic to make sure you have an appointment.  Take care,  Dr. Modena Slater, DO   Increase activity slowly   Complete by: As directed        Signed: Modena Slater, DO 08/11/2023, 2:55 PM   Pager: [phone]

## 2023-08-11 NOTE — TOC Transition Note (Signed)
 Transition of Care [facility]) - Discharge Note   Patient Details  Name: [name] MRN: [id number] Date of Birth: 02/06/1951  Transition of Care [facility]) CM/SW Contact:  Lawerance Sabal, RN Phone Number: 08/11/2023, 3:08 PM   Clinical Narrative:     No HH needs identified. Patient instructed by therapies on how to order specific brand she preferred for DME off [employer]   Final next level of care: Home/Self Care Barriers to Discharge: No Barriers Identified   Patient Goals and CMS Choice            Discharge Placement                       Discharge Plan and Services Additional resources added to the After Visit Summary for                                       Social Drivers of Health (SDOH) Interventions SDOH Screenings   Food Insecurity: No Food Insecurity (08/09/2023)  Housing: Low Risk  (08/09/2023)  Transportation Needs: No Transportation Needs (08/09/2023)  Utilities: Not At Risk (08/09/2023)  Depression (PHQ2-9): Low Risk  (07/30/2023)  Social Connections: Moderately Isolated (08/09/2023)  Tobacco Use: High Risk (08/10/2023)     Readmission Risk Interventions     No data to display

## 2023-08-11 NOTE — Progress Notes (Signed)
 HD#0 Subjective:   Summary: This is a 73 year old female with a history of duodenal AVMs, hypertension, tobacco use disorder, CKD stage IIIb who presents to the emergency department with concerns of GI bleed.  Patient admitted for further evaluation and management  Overnight Events: None  Patient is evaluated at bedside. Reports that she is in pain. Denies any hematochezia or melena.  She feels that her pain is not being well-managed.  I reassured her that we are giving her medications for this.  She denies any nausea or vomiting today.  She denies any bowel movements.  Objective:  Vital signs in last 24 hours: Vitals:   08/10/23 1145 08/10/23 1245 08/10/23 2046 08/11/23 0546  BP: 121/69 107/69 131/75 129/83  Pulse: 76 82 88 79  Resp: 11 16 20 18   Temp: 98.5 F (36.9 C) 98.9 F (37.2 C) 99.7 F (37.6 C) 98.5 F (36.9 C)  TempSrc:  Oral    SpO2: 93% 96% 99% 96%   Supplemental O2: Room Air SpO2: 96 %   Physical Exam: Right   Constitutional: Resting in bed, no acute distress Cardiovascular: regular rate and rhythm, no m/r/g Pulmonary/Chest: normal work of breathing on room air, lungs clear to auscultation bilaterally Abdomen: Tenderness noted throughout entire abdomen, normal active bowel sounds, no distention  There were no vitals filed for this visit.   Intake/Output Summary (Last 24 hours) at 08/11/2023 1308 Last data filed at 08/10/2023 1128 Gross per 24 hour  Intake 350 ml  Output --  Net 350 ml   Net IO Since Admission: 350 mL [08/11/23 0632]  Pertinent Labs:    Latest Ref Rng & Units 08/11/2023    4:26 AM 08/10/2023    9:02 AM 08/10/2023    7:05 AM  CBC  WBC 4.0 - 10.5 K/uL 6.9   4.7   Hemoglobin 12.0 - 15.0 g/dL 8.5  9.8  9.1   Hematocrit 36.0 - 46.0 % 27.8  31.2  28.8   Platelets 150 - 400 K/uL 307   271        Latest Ref Rng & Units 08/10/2023    7:05 AM 08/09/2023   12:30 PM 08/07/2023    3:00 PM  CMP  Glucose 70 - 99 mg/dL 657  846  962   BUN  8 - 23 mg/dL 33  38  54   Creatinine 0.44 - 1.00 mg/dL 9.52  8.41  3.24   Sodium 135 - 145 mmol/L 138  135  132   Potassium 3.5 - 5.1 mmol/L 4.9  5.1  4.9   Chloride 98 - 111 mmol/L 110  107  103   CO2 22 - 32 mmol/L 19  19  16    Calcium 8.9 - 10.3 mg/dL 8.4  8.2  8.1   Total Protein 6.5 - 8.1 g/dL  7.0  6.6   Total Bilirubin 0.0 - 1.2 mg/dL  0.4  <4.0   Alkaline Phos 38 - 126 U/L  65  66   AST 15 - 41 U/L  26  26   ALT 0 - 44 U/L  13  21     Imaging: No results found.  Assessment/Plan:   Active Problems:   Essential hypertension, benign   GERD   Generalized anxiety disorder   Tobacco use disorder   Angiectasia of gastrointestinal tract   Hyperlipidemia   Melena   Stage 3b chronic kidney disease (CKD) (HCC)   GI bleed   Anemia   Patient Summary: [name]  D Masri is a 73 y.o. female with a history of duodenal AVMs, hypertension, tobacco use disorder, CKD stage IIIb who presents to the emergency department with concerns of GI bleed.  Patient admitted for further evaluation and management  #Upper GI bleed #History of duodenal AVM #Chronic iron deficiency anemia Hemoglobin did have slight drop today to 8.5 from 9.8.  She reports she is having pain.  She has no other concerns this morning.  She states she has not eaten this morning.  Will try to have patient eat. -GI following, if they are not planning any other procedures, could potentially discharge today -Continue Protonix IV -Advance diet as tolerated  #Right arm pain Patient still having pain with her right arm.  Did counsel the patient on pain after surgery.  Will take some time to improve. -Take off the Dilaudid, transition to oxycodone -Scheduled Tylenol  Chronic conditions:  #Hypertension Blood pressure well-controlled -Holding home medications (losartan, carvedilol, amlodipine)  #Prediabetes No acute concerns at this time. -Continue Farxiga 10 mg daily  #Tobacco use disorder -Encourage tobacco use  cessation  #GERD -Continue IV Protonix -Could transition to oral PPI once patient can discharge  #CKD stage IIIb Creatinine at baseline. No acute concerns.  #Gout -Continue to hold allopurinol  Diet: Normal IVF: None,None VTE: SCDs Code: Full PT/OT recs: None, none. Family Update: Friend updated at bedside  Dispo: Anticipated discharge to Home in 1 days pending Clinical improvement.   Modena Slater DO Internal Medicine Resident PGY-2 [phone] Please contact the on call pager after 5 pm and on weekends at [phone].

## 2023-08-12 ENCOUNTER — Telehealth: Payer: Self-pay | Admitting: Student

## 2023-08-12 NOTE — Telephone Encounter (Signed)
 Opened in Error.

## 2023-08-13 ENCOUNTER — Encounter (HOSPITAL_COMMUNITY): Payer: Self-pay | Admitting: Pediatrics

## 2023-08-14 ENCOUNTER — Telehealth: Payer: Self-pay | Admitting: Student

## 2023-08-14 NOTE — Telephone Encounter (Signed)
 Copied from CRM [id number]. Topic: General - Other >> Aug 13, 2023  5:29 PM Adrianna P wrote: Reason for CRM: [employer] calling in to verify receival of fax  This has already been completed and faxed.

## 2023-08-14 NOTE — Telephone Encounter (Signed)
 Copied from CRM [id number]. Topic: General - Other >> Aug 13, 2023  5:29 PM Adrianna P wrote: Reason for CRM: [employer] calling in to verify receival of fax  This has already been completed and faxed per [employer] confirmation.

## 2023-08-16 ENCOUNTER — Encounter: Payer: Self-pay | Admitting: Hematology

## 2023-08-16 ENCOUNTER — Other Ambulatory Visit (HOSPITAL_COMMUNITY): Payer: Self-pay

## 2023-08-16 MED ORDER — NALOXONE HCL 4 MG/0.1ML NA LIQD: 1.0000 | NASAL | 0 refills | Status: DC

## 2023-08-16 MED ORDER — HYDROCODONE-ACETAMINOPHEN 5-325 MG PO TABS
1.0000 | ORAL_TABLET | Freq: Three times a day (TID) | ORAL | 0 refills | Status: DC | PRN
Start: 2023-08-16 — End: 2023-09-10
  Filled 2023-08-16: qty 20, 4d supply, fill #0

## 2023-08-19 ENCOUNTER — Encounter: Admitting: Student

## 2023-08-21 ENCOUNTER — Encounter: Payer: Self-pay | Admitting: Student

## 2023-08-21 ENCOUNTER — Other Ambulatory Visit: Payer: Self-pay | Admitting: Student

## 2023-08-21 ENCOUNTER — Other Ambulatory Visit (HOSPITAL_COMMUNITY): Payer: Self-pay

## 2023-08-21 ENCOUNTER — Encounter (HOSPITAL_COMMUNITY): Payer: Self-pay

## 2023-08-21 ENCOUNTER — Ambulatory Visit (INDEPENDENT_AMBULATORY_CARE_PROVIDER_SITE_OTHER): Admitting: Student

## 2023-08-21 VITALS — BP 147/76 | HR 99 | Temp 98.4°F | Ht 61.0 in | Wt 150.4 lb

## 2023-08-21 DIAGNOSIS — K922 Gastrointestinal hemorrhage, unspecified: Secondary | ICD-10-CM | POA: Diagnosis not present

## 2023-08-21 DIAGNOSIS — I10 Essential (primary) hypertension: Secondary | ICD-10-CM

## 2023-08-21 DIAGNOSIS — F1990 Other psychoactive substance use, unspecified, uncomplicated: Secondary | ICD-10-CM

## 2023-08-21 DIAGNOSIS — K921 Melena: Secondary | ICD-10-CM

## 2023-08-21 DIAGNOSIS — K219 Gastro-esophageal reflux disease without esophagitis: Secondary | ICD-10-CM

## 2023-08-21 MED ORDER — PANTOPRAZOLE SODIUM 40 MG PO TBEC
40.0000 mg | DELAYED_RELEASE_TABLET | Freq: Every day | ORAL | 1 refills | Status: DC
  Filled 2023-08-21 – 2023-09-12 (×2): qty 42, 42d supply, fill #0
  Filled 2023-10-11 – 2023-10-14 (×2): qty 42, 42d supply, fill #1

## 2023-08-21 MED ORDER — CARVEDILOL 6.25 MG PO TABS
6.2500 mg | ORAL_TABLET | Freq: Two times a day (BID) | ORAL | 11 refills | Status: DC
  Filled 2023-08-21 – 2023-09-16 (×3): qty 60, 30d supply, fill #0
  Filled 2023-10-15: qty 60, 30d supply, fill #1
  Filled 2023-11-14: qty 60, 30d supply, fill #2
  Filled 2023-12-09: qty 60, 30d supply, fill #3
  Filled 2024-01-06: qty 60, 30d supply, fill #4
  Filled 2024-02-05: qty 60, 30d supply, fill #5

## 2023-08-21 NOTE — Telephone Encounter (Signed)
 Medication discontinued on 08/11/23

## 2023-08-21 NOTE — Assessment & Plan Note (Signed)
 Today her blood pressure is not at goal.  Blood pressure is 147/76.  Home pressures are similarly elevated.  Unfortunately she uses intermittent cocaine which probably contributes to her blood pressure. - Continue amlodipine 10 daily - Increase Coreg from 3.125-6.25 twice daily

## 2023-08-21 NOTE — Assessment & Plan Note (Addendum)
 Hospital follow-up for GI bleed.  She was admitted with melena and hemoglobin was as low as 8.5, however she did not ever need transfusion.  Endoscopy revealed some stigmata of previous ulcers that were cauterized.  She has had no melena since discharge.  She feels good on her feet.  Her only complaint is some residual fatigue.  She knows to avoid NSAIDs and has done so for some time.  It is interesting, she has had 2 GI bleeds in her past and both were after general anesthesia.  Earlier this month she was under anesthesia for a right arm surgery. - Check CBC - Refill pantoprazole 40.  This can probably be stopped at the next visit - She sees [name] outpatient and they do iron infusions.  On chart review it appears that [name] would like to resume iron infusions even before this bleed occurred.  I have asked [name] to reach out to that clinic to resume iron infusions. - Avoid NSAIDs, she knows this.

## 2023-08-21 NOTE — Progress Notes (Signed)
 CC: HFU GI bleed  HPI:  [name] is a 73 y.o. female with a PMH stated below who presents today for HFU. She reports that she is doing well. She has lingering fatigue but absolutely no melena, abdominal pain, dizziness.  Please see problem based assessment and plan for additional details.  Past Medical History:  Diagnosis Date   Allergic rhinitis    Anemia, iron deficiency 05/03/2011   2/2 GI AVMs - recieves iron infusions as well as periodic red blood cell transfusions   Anxiety    Arteriovenous malformation of duodenum 02/25/2015   CARPAL TUNNEL RELEASE, HX OF 07/22/2008   Chest pain 10/28/2019   Colon polyps    Diabetes mellitus without complication (HCC)    type 2   Difficulty sleeping    takes trazadone for sleep   Diverticulosis    Electrolyte abnormality 01/25/2021   Fall 01/03/2020   GERD (gastroesophageal reflux disease)    GI bleed 07/07/2020   Hepatitis C    in SVR with Harvoni 12/2014-02/2015.    History of hernia repair 08/18/2012   History of porphyria 11/30/2014   Porphyria cutanea tarda in setting of chronic Hepatitis C, frequent  IV iron infusions, and alcohol abuse    Hypertension    Hypocalcemia 10/28/2019   Hypocalcemia 01/19/2021   Hypokalemia 09/27/2014   Hypokalemia 01/19/2021   Hypomagnesemia 01/19/2021   Kidney disease 02/04/2019   Peripheral vascular disease (HCC)    Prolonged QT interval 01/19/2021   Stroke (HCC) 2010   no residual deficits   Substance use disorder    cocaine   Tachycardia 07/10/2017   Trichomonas vaginitis 04/16/2019    Review of Systems: ROS negative except for what is noted on the assessment and plan.  Vitals:   08/21/23 0856 08/21/23 0905  BP: (!) 157/82 (!) 147/76  Pulse: 99 99  Temp: 98.4 F (36.9 C)   TempSrc: Oral   SpO2: 99%   Weight: 150 lb 6.4 oz (68.2 kg)   Height: 5\' 1"  (1.549 m)     Physical Exam: Constitutional: well-appearing woman in no acute distress in transport chair HENT: normocephalic  atraumatic, mucous membranes moist Eyes: conjunctiva non-erythematous Cardiovascular: regular rate and rhythm, no m/r/g Pulmonary/Chest: normal work of breathing on room air, lungs clear to auscultation bilaterally Abdominal: soft, non-tender, non-distended. Bowel sounds present and normal. MSK: normal bulk and tone Neurological: alert & oriented x 3, no focal deficit Skin: warm and dry Psych: normal mood and behavior  Assessment & Plan:   Patient discussed with Dr. Sol Blazing  GI bleed Hospital follow-up for GI bleed.  She was admitted with melena and hemoglobin was as low as 8.5, however she did not ever need transfusion.  Endoscopy revealed some stigmata of previous ulcers that were cauterized.  She has had no melena since discharge.  She feels good on her feet.  Her only complaint is some residual fatigue.  She knows to avoid NSAIDs and has done so for some time.  It is interesting, she has had 2 GI bleeds in her past and both were after general anesthesia.  Earlier this month she was under anesthesia for a right arm surgery. - Check CBC - Refill pantoprazole 40.  This can probably be stopped at the next visit - She sees Dr. Blake Divine outpatient and they do iron infusions.  On chart review it appears that Dr. Marquis Buggy would like to resume iron infusions even before this bleed occurred.  I have asked Ms. Roxan Hockey to  reach out to that clinic to resume iron infusions. - Avoid NSAIDs, she knows this.  Essential hypertension, benign Today her blood pressure is not at goal.  Blood pressure is 147/76.  Home pressures are similarly elevated.  Unfortunately she uses intermittent cocaine which probably contributes to her blood pressure. - Continue amlodipine 10 daily - Increase Coreg from 3.125-6.25 twice daily  Substance use disorder I did not have time to discuss this today but per chart it appears there is ongoing intermittent cocaine and marijuana use.  I at least would like that she stops the cocaine for  vascular health.  If able, can consider revisiting this at next visit.  RTC in 3 months for A1c and general check.  ACR will be due at that time. It is due today but she politely declined.  Katheran James, D.O. [facility] Internal Medicine, PGY-1 Phone: [phone] Date 08/21/2023 Time 12:20 PM

## 2023-08-21 NOTE — Assessment & Plan Note (Addendum)
 I did not have time to discuss this today but per chart it appears there is ongoing intermittent cocaine and marijuana use.  I at least would like that she stops the cocaine for heart/vascular health.  If able, can consider revisiting this at next visit.

## 2023-08-21 NOTE — Patient Instructions (Addendum)
 It was a pleasure to see you today.  Please increase your carvedilol/coreg to 6.25mg  twice daily.  Please reach out to Dr. Mosetta Putt to set up iron infusions.  Return for a general checkup in 3 months.

## 2023-08-22 ENCOUNTER — Other Ambulatory Visit: Payer: Self-pay

## 2023-08-22 LAB — CBC
Hematocrit: 35.7 % (ref 34.0–46.6)
Hemoglobin: 10.9 g/dL — ABNORMAL LOW (ref 11.1–15.9)
MCH: 27 pg (ref 26.6–33.0)
MCHC: 30.5 g/dL — ABNORMAL LOW (ref 31.5–35.7)
MCV: 88 fL (ref 79–97)
Platelets: 470 10*3/uL — ABNORMAL HIGH (ref 150–450)
RBC: 4.04 x10E6/uL (ref 3.77–5.28)
RDW: 20 % — ABNORMAL HIGH (ref 11.7–15.4)
WBC: 8.4 10*3/uL (ref 3.4–10.8)

## 2023-08-22 NOTE — Progress Notes (Signed)
 Internal Medicine Clinic Attending  Case discussed with the resident at the time of the visit.  We reviewed the resident's history and exam and pertinent patient test results.  I agree with the assessment, diagnosis, and plan of care documented in the resident's note.

## 2023-08-22 NOTE — Addendum Note (Signed)
 Addended by: Dickie La on: 08/22/2023 10:41 AM   Modules accepted: Level of Service

## 2023-08-23 ENCOUNTER — Encounter: Payer: Self-pay | Admitting: Student

## 2023-08-26 ENCOUNTER — Other Ambulatory Visit: Payer: Self-pay

## 2023-08-29 ENCOUNTER — Other Ambulatory Visit: Payer: Self-pay

## 2023-09-10 ENCOUNTER — Other Ambulatory Visit (HOSPITAL_COMMUNITY): Payer: Self-pay

## 2023-09-10 MED ORDER — HYDROCODONE-ACETAMINOPHEN 5-325 MG PO TABS
1.0000 | ORAL_TABLET | Freq: Three times a day (TID) | ORAL | 0 refills | Status: DC | PRN
  Filled 2023-09-10: qty 15, 3d supply, fill #0

## 2023-09-12 ENCOUNTER — Other Ambulatory Visit (HOSPITAL_COMMUNITY): Payer: Self-pay

## 2023-09-12 ENCOUNTER — Other Ambulatory Visit: Payer: Self-pay

## 2023-09-16 ENCOUNTER — Telehealth: Payer: Self-pay

## 2023-09-16 ENCOUNTER — Other Ambulatory Visit: Payer: Self-pay

## 2023-09-16 ENCOUNTER — Telehealth: Payer: Self-pay | Admitting: Hematology

## 2023-09-16 NOTE — Telephone Encounter (Signed)
 Received telephone call from the patient inquiring orders for IV iron.  Patient states she spoke with someone at [facility] infusion center and was advised to contact our office to request orders.  Let patient know that I would forward her message request to Dr. Maryalice Smaller and her team and that I would contact her once the orders have been put in.

## 2023-09-16 NOTE — Telephone Encounter (Signed)
 Scheduled appointments per the patient and nurses request. The patient is aware of the appointment details.

## 2023-09-18 ENCOUNTER — Telehealth: Payer: Self-pay | Admitting: Hematology

## 2023-09-19 ENCOUNTER — Other Ambulatory Visit: Payer: Self-pay | Admitting: Nurse Practitioner

## 2023-09-19 ENCOUNTER — Inpatient Hospital Stay: Admitting: Nurse Practitioner

## 2023-09-19 ENCOUNTER — Inpatient Hospital Stay

## 2023-09-19 ENCOUNTER — Inpatient Hospital Stay: Admitting: Licensed Clinical Social Worker

## 2023-09-19 ENCOUNTER — Inpatient Hospital Stay: Attending: Hematology

## 2023-09-19 VITALS — BP 140/70 | HR 100 | Temp 97.5°F | Resp 17 | Wt 151.1 lb

## 2023-09-19 DIAGNOSIS — F1910 Other psychoactive substance abuse, uncomplicated: Secondary | ICD-10-CM

## 2023-09-19 DIAGNOSIS — C19 Malignant neoplasm of rectosigmoid junction: Secondary | ICD-10-CM

## 2023-09-19 DIAGNOSIS — Z79899 Other long term (current) drug therapy: Secondary | ICD-10-CM | POA: Diagnosis not present

## 2023-09-19 DIAGNOSIS — K922 Gastrointestinal hemorrhage, unspecified: Secondary | ICD-10-CM | POA: Diagnosis present

## 2023-09-19 DIAGNOSIS — D649 Anemia, unspecified: Secondary | ICD-10-CM

## 2023-09-19 DIAGNOSIS — D500 Iron deficiency anemia secondary to blood loss (chronic): Secondary | ICD-10-CM | POA: Insufficient documentation

## 2023-09-19 DIAGNOSIS — F1420 Cocaine dependence, uncomplicated: Secondary | ICD-10-CM | POA: Diagnosis not present

## 2023-09-19 DIAGNOSIS — F32A Depression, unspecified: Secondary | ICD-10-CM | POA: Insufficient documentation

## 2023-09-19 LAB — CMP (CANCER CENTER ONLY)
ALT: 7 U/L (ref 0–44)
AST: 15 U/L (ref 15–41)
Albumin: 3.9 g/dL (ref 3.5–5.0)
Alkaline Phosphatase: 94 U/L (ref 38–126)
Anion gap: 3 — ABNORMAL LOW (ref 5–15)
BUN: 19 mg/dL (ref 8–23)
CO2: 28 mmol/L (ref 22–32)
Calcium: 8.6 mg/dL — ABNORMAL LOW (ref 8.9–10.3)
Chloride: 108 mmol/L (ref 98–111)
Creatinine: 1.54 mg/dL — ABNORMAL HIGH (ref 0.44–1.00)
GFR, Estimated: 36 mL/min — ABNORMAL LOW (ref >60)
Glucose, Bld: 103 mg/dL — ABNORMAL HIGH (ref 70–99)
Potassium: 4.8 mmol/L (ref 3.5–5.1)
Sodium: 139 mmol/L (ref 135–145)
Total Bilirubin: 0.1 mg/dL (ref 0.0–1.2)
Total Protein: 6.8 g/dL (ref 6.5–8.1)

## 2023-09-19 LAB — CBC WITH DIFFERENTIAL (CANCER CENTER ONLY)
Abs Immature Granulocytes: 0.08 10*3/uL — ABNORMAL HIGH (ref 0.00–0.07)
Basophils Absolute: 0.1 10*3/uL (ref 0.0–0.1)
Basophils Relative: 1 %
Eosinophils Absolute: 0.1 10*3/uL (ref 0.0–0.5)
Eosinophils Relative: 2 %
HCT: 36.4 % (ref 36.0–46.0)
Hemoglobin: 11.4 g/dL — ABNORMAL LOW (ref 12.0–15.0)
Immature Granulocytes: 1 %
Lymphocytes Relative: 13 %
Lymphs Abs: 0.9 10*3/uL (ref 0.7–4.0)
MCH: 27.7 pg (ref 26.0–34.0)
MCHC: 31.3 g/dL (ref 30.0–36.0)
MCV: 88.3 fL (ref 80.0–100.0)
Monocytes Absolute: 0.8 10*3/uL (ref 0.1–1.0)
Monocytes Relative: 12 %
Neutro Abs: 5 10*3/uL (ref 1.7–7.7)
Neutrophils Relative %: 71 %
Platelet Count: 259 10*3/uL (ref 150–400)
RBC: 4.12 MIL/uL (ref 3.87–5.11)
RDW: 18.8 % — ABNORMAL HIGH (ref 11.5–15.5)
WBC Count: 6.9 10*3/uL (ref 4.0–10.5)
nRBC: 0 % (ref 0.0–0.2)

## 2023-09-19 LAB — RAPID URINE DRUG SCREEN, HOSP PERFORMED
Amphetamines: NOT DETECTED
Barbiturates: NOT DETECTED
Benzodiazepines: NOT DETECTED
Cocaine: POSITIVE — AB
Opiates: NOT DETECTED
Tetrahydrocannabinol: POSITIVE — AB

## 2023-09-19 LAB — FERRITIN: Ferritin: 24 ng/mL (ref 11–307)

## 2023-09-19 NOTE — Assessment & Plan Note (Addendum)
 secondary to GI blood loss, and Anemia of Chronic Disease -longstanding history of intermittent anemia dating back at least 2008 when Hg was 10. -h/o multiple GI bleeding in 2016, required blood transfusion, hospitalization and IV Feraheme  -EGD and colonoscopy on 10/29/17 and 03/17/18 with Dr. Dominic Friendly, which were normal. 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 -Due to recurrent anemia requiring IV Feraheme  and blood transfusions, she started bevacizumab  for GI bleeding on 08/10/20. She responded very well with resolved anemia and less need for iv iron. This has been held intermittently due to elevated urine protein. Last dose given 09/2022 - Due to her use substance abuse especially cocaine, I have stopped bevacizumab .  Will continue IV iron and blood transfusion as needed.

## 2023-09-19 NOTE — Progress Notes (Signed)
 [facility]   Telephone:(336) [phone] [phone]   Clinic Follow up Note   Patient Care Team: Carleen Chary, DO as PCP - General Maris Sickle, MD as Consulting Physician (Ophthalmology) Oza Blumenthal, MD as Consulting Physician (General Surgery) Sonja , MD as Consulting Physician (Hematology) Comer, Judithann Novas, MD as Consulting Physician (Infectious Diseases) Albertina Hugger, MD as Consulting Physician (Gastroenterology)  Date of Service:  09/19/2023  CHIEF COMPLAINT: f/u of anemia   CURRENT THERAPY:  Iv iron as needed   Hematology History   Iron deficiency anemia due to chronic blood loss secondary to GI blood loss, and Anemia of Chronic Disease -longstanding history of intermittent anemia dating back at least 2008 when Hg was 10. -h/o multiple GI bleeding in 2016, required blood transfusion, hospitalization and IV Feraheme  -EGD and colonoscopy on 10/29/17 and 03/17/18 with Dr. Dominic Friendly, which were normal. 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 -Due to recurrent anemia requiring IV Feraheme  and blood transfusions, she started bevacizumab  for GI bleeding on 08/10/20. She responded very well with resolved anemia and less need for iv iron. This has been held intermittently due to elevated urine protein. Last dose given 09/2022 - Due to her use substance abuse especially cocaine, I have stopped bevacizumab .  Will continue IV iron and blood transfusion as needed.   Assessment & Plan Anemia Mild anemia with hemoglobin at 11.4, improved from previous levels. Symptoms like weakness, anorexia, and insomnia are unlikely related to anemia. Recent IVR treatments in February and March improved blood counts. Avastin  is held due to potential interaction with cocaine use. - Order iron level test. - Schedule blood count checks every four weeks. - Arrange IVR treatment at [facility] if iron levels are low. - Schedule follow-up in  four months.  Cocaine dependence Ongoing cocaine use. Discussed health impact and potential interaction with Avastin . Emphasized addressing substance use for health improvement. - Offer referral for drug rehabilitation and counseling.  Depression Symptoms include anorexia and insomnia. No prior counseling or treatment. Discussed benefits of counseling and medication. - Refer to social worker Maryan Smalling for counseling. - Contact primary care physician to discuss depression management and potential mental health specialist referral.  Plan - Lab reviewed, hemoglobin improved due to her recent IV iron.  Today's iron level still pending, will continue IV Feraheme  as needed if ferritin less than 20 -She agrees to have IV Feraheme  at the?  [facility] office if needed - Will schedule lab work monthly - I made a urgent referral to social worker Amalia Badder today, for depression and substance dependence counseling - Follow-up in 3 months -I also encouraged her to follow-up with her primary care physician for her insomnia and anorexia.  Discussed the use of AI scribe software for clinical note transcription with the patient, who gave verbal consent to proceed.  History of Present Illness The patient, a 73 year old female with a history of anemia, presents for follow-up. She expresses feelings of abandonment and dissatisfaction with her care, particularly regarding her recent referral to a student doctor and the location of her IV iron (IVR) treatments. She reports feeling weak and down, with a poor appetite and difficulty sleeping. She admits to ongoing cocaine use, stating she feels she has no other options for happiness. She denies any knowledge of her anemia improving, with her last known hemoglobin level being 11.8.     All other systems were reviewed with the patient and are negative.  MEDICAL HISTORY:  Past Medical History:  Diagnosis Date   Allergic rhinitis    Anemia, iron deficiency 05/03/2011    2/2 GI AVMs - recieves iron infusions as well as periodic red blood cell transfusions   Anxiety    Arteriovenous malformation of duodenum 02/25/2015   CARPAL TUNNEL RELEASE, HX OF 07/22/2008   Chest pain 10/28/2019   Colon polyps    Diabetes mellitus without complication (HCC)    type 2   Difficulty sleeping    takes trazadone for sleep   Diverticulosis    Electrolyte abnormality 01/25/2021   Fall 01/03/2020   GERD (gastroesophageal reflux disease)    GI bleed 07/07/2020   Hepatitis C    in SVR with Harvoni  12/2014-02/2015.    History of hernia repair 08/18/2012   History of porphyria 11/30/2014   Porphyria cutanea tarda in setting of chronic Hepatitis C, frequent  IV iron infusions, and alcohol abuse    Hypertension    Hypocalcemia 10/28/2019   Hypocalcemia 01/19/2021   Hypokalemia 09/27/2014   Hypokalemia 01/19/2021   Hypomagnesemia 01/19/2021   Kidney disease 02/04/2019   Peripheral vascular disease (HCC)    Prolonged QT interval 01/19/2021   Stroke (HCC) 2010   no residual deficits   Substance use disorder    cocaine   Tachycardia 07/10/2017   Trichomonas vaginitis 04/16/2019    SURGICAL HISTORY: Past Surgical History:  Procedure Laterality Date   ABDOMINAL HYSTERECTOMY     APPLICATION OF WOUND VAC N/A 06/21/2014   Procedure: APPLICATION OF WOUND VAC;  Surgeon: Oza Blumenthal, MD;  Location: [facility] OR;  Service: General;  Laterality: N/A;   CARPAL TUNNEL RELEASE     rt hand   COLONOSCOPY WITH PROPOFOL  N/A 03/17/2018   Procedure: COLONOSCOPY WITH PROPOFOL ;  Surgeon: Albertina Hugger, MD;  Location: [facility];  Service: Gastroenterology;  Laterality: N/A;   ENTEROSCOPY N/A 12/04/2012   Procedure: ENTEROSCOPY;  Surgeon: Almeda Aris, MD;  Location: [facility] END[facility];  Service: Endoscopy;  Laterality: N/A;   ENTEROSCOPY N/A 12/18/2012   Procedure: ENTEROSCOPY;  Surgeon: Almeda Aris, MD;  Location: [facility];  Service: Endoscopy;  Laterality: N/A;   ENTEROSCOPY N/A 02/25/2015    Procedure: ENTEROSCOPY;  Surgeon: Claudette Cue, MD;  Location: [facility] ENDOSCOPY;  Service: Endoscopy;  Laterality: N/A;   ENTEROSCOPY N/A 10/29/2017   Procedure: ENTEROSCOPY;  Surgeon: Albertina Hugger, MD;  [facility]: [facility];  Service: Gastroenterology;  Laterality: N/A;   ENTEROSCOPY N/A 07/08/2020   Procedure: ENTEROSCOPY;  Surgeon: Janel Medford, MD;  Location: [facility];  Service: Endoscopy;  Laterality: N/A;   ENTEROSCOPY N/A 08/10/2023   Procedure: ENTEROSCOPY;  Surgeon: Truddie Furrow, MD;  Location: [facility] ENDOSCOPY;  Service: Gastroenterology;  Laterality: N/A;   ESOPHAGOGASTRODUODENOSCOPY  05/05/2011   Procedure: ESOPHAGOGASTRODUODENOSCOPY (EGD);  Surgeon: Laurell Pond, MD;  Location: [facility];  Service: Gastroenterology;  Laterality: N/A;  Dr. Bridgett Camps will do procedure for Dr. Nickey Barn Saturday.   ESOPHAGOGASTRODUODENOSCOPY  05/08/2011   Procedure: ESOPHAGOGASTRODUODENOSCOPY (EGD);  Surgeon: Almeda Aris;  [facility];  Service: Endoscopy;  Laterality: N/A;   ESOPHAGOGASTRODUODENOSCOPY  06/07/2011   Procedure: ESOPHAGOGASTRODUODENOSCOPY (EGD);  Surgeon: Almeda Aris, MD;  Location: [facility];  Service: Endoscopy;  Laterality: N/A;   ESOPHAGOGASTRODUODENOSCOPY  12/20/2011   Procedure: ESOPHAGOGASTRODUODENOSCOPY (EGD);  Surgeon: Almeda Aris, MD;  Location: [facility] END[facility];  Service: Endoscopy;  Laterality: N/A;   EYE SURGERY Bilateral    cataracts   FLEXIBLE SIGMOIDOSCOPY  12/21/2011  Procedure: FLEXIBLE SIGMOIDOSCOPY;  Surgeon: Almeda Aris, MD;  [facility];  Service: Endoscopy;  Laterality: N/A;   HARDWARE REMOVAL Right 08/08/2023   Procedure: HARDWARE REMOVAL RIGHT WRIST;  Surgeon: Brunilda Capra, MD;  Location: [facility];  Service: Orthopedics;  Laterality: Right;  60 MIN   HOT HEMOSTASIS  06/07/2011   Procedure: HOT HEMOSTASIS (ARGON PLASMA COAGULATION/BICAP);  Surgeon: Almeda Aris, MD;  Location: [facility];  Service: Endoscopy;   Laterality: N/A;   HOT HEMOSTASIS N/A 07/08/2020   Procedure: HOT HEMOSTASIS (ARGON PLASMA COAGULATION/BICAP);  Surgeon: Janel Medford, MD;  Location: [facility];  Service: Endoscopy;  Laterality: N/A;   HOT HEMOSTASIS N/A 08/10/2023   Procedure: EGD, WITH ARGON PLASMA COAGULATION;  Surgeon: Truddie Furrow, MD;  Location: [facility] ENDOSCOPY;  Service: Gastroenterology;  Laterality: N/A;   INCISIONAL HERNIA REPAIR N/A 06/01/2014   Procedure: ATTEMPTED LAPAROSCOPIC AND OPEN INCISIONAL HERNIA REPAIR WITH MESH;  Surgeon: Oza Blumenthal, MD;  Location: [facility] OR;  Service: General;  Laterality: N/A;   INSERTION OF MESH N/A 06/01/2014   Procedure: INSERTION OF MESH;  Surgeon: Oza Blumenthal, MD;  Location: [facility] OR;  Service: General;  Laterality: N/A;   LAPAROTOMY  02/16/2012   Procedure: EXPLORATORY LAPAROTOMY;  Surgeon: Quitman Bucy, MD;  Location: [facility];  Service: General;  Laterality: N/A;  oversewing of anastomotic leak and rigid sigmoidoscopy   LAPAROTOMY N/A 06/21/2014   Procedure: ABDOMINAL WOUND EXPLORATION;  Surgeon: Oza Blumenthal, MD;  Location: [facility];  Service: General;  Laterality: N/A;   LEG AMPUTATION ABOVE KNEE     left   PARTIAL COLECTOMY  02/15/2012   Procedure: PARTIAL COLECTOMY;  Surgeon: Rogena Class, MD;  Location: [facility];  Service: General;  Laterality: N/A;   SUBMUCOSAL INJECTION  07/08/2020   Procedure: SUBMUCOSAL INJECTION;  Surgeon: Janel Medford, MD;  Location[facility];  Service: Endoscopy;;   TONSILLECTOMY     WRIST FUSION Right 04/27/2021   Procedure: SCAPHOID EXCISION  WITH 4 BONE FUSION RIGHT WRIST;  Surgeon: Brunilda Capra, MD;  Location: [facility];  Service: Orthopedics;  Laterality: Right;    I have reviewed the social history and family history with the patient and they are unchanged from previous note.  ALLERGIES:  is allergic to ciprofloxacin , morphine  and codeine, penicillins, codeine, lisinopril , and feraheme   [ferumoxytol ].  MEDICATIONS:  Current Outpatient Medications  Medication Sig Dispense Refill   ACCU-CHEK FASTCLIX LANCETS MISC Use to test blood glucose 3 times daily. 100 each 11   acetaminophen  (TYLENOL ) 500 MG tablet Take 1,000 mg by mouth every 6 (six) hours as needed for mild pain (pain score 1-3) or moderate pain (pain score 4-6).     allopurinol  (ZYLOPRIM ) 100 MG tablet Take 1/2 tablet (50 mg total) by mouth daily.  Begin medication after one week of colchicine  treatment. (Patient taking differently: Take 50 mg by mouth as needed (gout).) 30 tablet 2   amLODipine  (NORVASC ) 10 MG tablet Take 1 tablet (10 mg total) by mouth daily. (Patient taking differently: Take 10 mg by mouth in the morning.) 90 tablet 1   Blood Glucose Monitoring Suppl (ACCU-CHEK GUIDE) w/Device KIT 1 each by Does not apply route 3 (three) times daily. The patient is insulin  requiring, ICD 10 code E11.65. The patient tests 3 times a day. 1 kit 1   calcium -vitamin D  (OSCAL WITH D) 500-200 MG-UNIT tablet Take 1 tablet by mouth 2 (two) times daily. 60 tablet 0   camphor-menthol  (  SARNA) lotion Apply 1 Application topically as needed for itching. 222 mL 1   carvedilol  (COREG ) 6.25 MG tablet Take 1 tablet (6.25 mg total) by mouth 2 (two) times daily. 60 tablet 11   dapagliflozin  propanediol (FARXIGA ) 10 MG TABS tablet Take 1 tablet by mouth daily. (Patient taking differently: Take 10 mg by mouth in the morning.) 30 tablet 12   Dulaglutide  (TRULICITY ) 0.75 MG/0.5ML SOAJ Inject 0.75 mg into the skin once a week. (Patient taking differently: Inject 0.75 mg into the skin once a week. On Sunday) 6 mL 3   famotidine  (PEPCID ) 20 MG tablet Take 1 tablet (20 mg total) by mouth 2 (two) times daily. (Patient taking differently: Take 20 mg by mouth as needed for heartburn or indigestion.) 60 tablet 1   gabapentin  (NEURONTIN ) 400 MG capsule Take 1 capsule (400 mg total) by mouth in the morning AND 1 capsule (400 mg total) daily in the  afternoon AND 2 capsules (800 mg total) before bedtime. 360 capsule 3   glucose blood (ACCU-CHEK GUIDE) test strip Use as instructed 100 strip 3   HYDROcodone -acetaminophen  (NORCO/VICODIN) 5-325 MG tablet Take 1-2 tablets by mouth every 8 (eight) hours as needed for moderate pain (4-6). 15 tablet 0   losartan  (COZAAR ) 100 MG tablet Take 1 tablet (100 mg total) by mouth daily. (Patient taking differently: Take 100 mg by mouth in the morning.) 90 tablet 1   naloxone  (NARCAN ) nasal spray 4 mg/0.1 mL Place 1 spray into the nose as directed for accidental overdose, call 911 and may repeat 2nd spray in alternate nostril if no response in 2-3 minutes. 2 each 0   pantoprazole  (PROTONIX ) 40 MG tablet Take 1 tablet (40 mg total) by mouth daily. 42 tablet 1   rosuvastatin  (CRESTOR ) 10 MG tablet Take 1 tablet (10 mg total) by mouth in the morning. 90 tablet 11   No current facility-administered medications for this visit.    PHYSICAL EXAMINATION: ECOG PERFORMANCE STATUS: 2 - Symptomatic, <50% confined to bed  Vitals:   09/19/23 1111  BP: (!) 140/70  Pulse: 100  Resp: 17  Temp: (!) 97.5 F (36.4 C)  SpO2: 97%   Wt Readings from Last 3 Encounters:  09/19/23 151 lb 1.6 oz (68.5 kg)  08/21/23 150 lb 6.4 oz (68.2 kg)  08/08/23 123 lb 14.4 oz (56.2 kg)     GENERAL:alert, no distress and comfortable SKIN: skin color, texture, turgor are normal, no rashes or significant lesions EYES: normal, Conjunctiva are pink and non-injected, sclera clear Musculoskeletal:no cyanosis of digits and no clubbing, left BKA  Physical Exam    LABORATORY DATA:  I have reviewed the data as listed    Latest Ref Rng & Units 09/19/2023   10:29 AM 08/21/2023    9:38 AM 08/11/2023    1:47 PM  CBC  WBC 4.0 - 10.5 K/uL 6.9  8.4    Hemoglobin 12.0 - 15.0 g/dL 84.6  96.2  8.8   Hematocrit 36.0 - 46.0 % 36.4  35.7  29.2   Platelets 150 - 400 K/uL 259  470          Latest Ref Rng & Units 09/19/2023   10:29 AM  08/10/2023    7:05 AM 08/09/2023   12:30 PM  CMP  Glucose 70 - 99 mg/dL 952  841  324   BUN 8 - 23 mg/dL 19  33  38   Creatinine 0.44 - 1.00 mg/dL 4.01  0.27  2.53   Sodium  135 - 145 mmol/L 139  138  135   Potassium 3.5 - 5.1 mmol/L 4.8  4.9  5.1   Chloride 98 - 111 mmol/L 108  110  107   CO2 22 - 32 mmol/L 28  19  19    Calcium  8.9 - 10.3 mg/dL 8.6  8.4  8.2   Total Protein 6.5 - 8.1 g/dL 6.8   7.0   Total Bilirubin 0.0 - 1.2 mg/dL 0.1   0.4   Alkaline Phos 38 - 126 U/L 94   65   AST 15 - 41 U/L 15   26   ALT 0 - 44 U/L 7   13       RADIOGRAPHIC STUDIES: I have personally reviewed the radiological images as listed and agreed with the findings in the report. No results found.    No orders of the defined types were placed in this encounter.  All questions were answered. The patient knows to call the clinic with any problems, questions or concerns. No barriers to learning was detected. The total time spent in the appointment was 25 minutes.     Sonja Krebs, MD 09/19/2023

## 2023-09-19 NOTE — Progress Notes (Signed)
 [facility] CSW Progress Note  Visual merchandiser met with patient to discuss substance abuse resources and counseling.  Pt tearful during interaction as she is experiencing a great deal of anxiety and has not been sleeping well.  Pt is concerned there is something medically wrong that the doctors have not found.  CSW provided space for pt to verbalize her feelings, emotional support provided.  Pt reassured Dr Maryalice Smaller reviewed her lab work which did not have any alarming results.  Pt requested contact information for substance abuse counseling.  Pt provided w/ the contact number for [facility].  CSW to follow up w/ pt next week for additional support.      Quenton Bruns, LCSW Clinical Social Worker [facility]

## 2023-09-23 ENCOUNTER — Inpatient Hospital Stay: Attending: Hematology

## 2023-09-23 ENCOUNTER — Other Ambulatory Visit (HOSPITAL_COMMUNITY): Payer: Self-pay

## 2023-09-23 ENCOUNTER — Inpatient Hospital Stay: Admitting: Hematology

## 2023-09-24 ENCOUNTER — Inpatient Hospital Stay: Admitting: Licensed Clinical Social Worker

## 2023-09-24 DIAGNOSIS — D649 Anemia, unspecified: Secondary | ICD-10-CM

## 2023-09-24 NOTE — Progress Notes (Signed)
 [facility] CSW Progress Note  Clinical Child psychotherapist contacted patient by phone to check in.  Pt states she is doing well and physically is feeling better.  Pt reports she is trying to focus on the positive as she would like to enjoy life as much as possible and recognizes she needs to take care of her health in order to do that.  Pt has not yet called for substance abuse support, but plans to.  CSW to see pt at her next visit w/ Dr. Maryalice Smaller.      Quenton Bruns, LCSW Clinical Social Worker [facility]

## 2023-10-09 ENCOUNTER — Telehealth: Payer: Self-pay | Admitting: Hematology

## 2023-10-09 ENCOUNTER — Other Ambulatory Visit (HOSPITAL_BASED_OUTPATIENT_CLINIC_OR_DEPARTMENT_OTHER): Payer: Self-pay

## 2023-10-09 ENCOUNTER — Other Ambulatory Visit (HOSPITAL_COMMUNITY): Payer: Self-pay

## 2023-10-09 ENCOUNTER — Other Ambulatory Visit: Payer: Self-pay

## 2023-10-09 ENCOUNTER — Encounter

## 2023-10-09 ENCOUNTER — Inpatient Hospital Stay: Attending: Hematology | Admitting: Licensed Clinical Social Worker

## 2023-10-09 DIAGNOSIS — F1410 Cocaine abuse, uncomplicated: Secondary | ICD-10-CM | POA: Insufficient documentation

## 2023-10-09 DIAGNOSIS — K922 Gastrointestinal hemorrhage, unspecified: Secondary | ICD-10-CM | POA: Insufficient documentation

## 2023-10-09 DIAGNOSIS — D649 Anemia, unspecified: Secondary | ICD-10-CM

## 2023-10-09 DIAGNOSIS — D500 Iron deficiency anemia secondary to blood loss (chronic): Secondary | ICD-10-CM | POA: Insufficient documentation

## 2023-10-09 MED ORDER — AZITHROMYCIN 250 MG PO TABS
ORAL_TABLET | ORAL | 0 refills | Status: AC
  Filled 2023-10-09 (×2): qty 6, 5d supply, fill #0

## 2023-10-09 NOTE — Progress Notes (Signed)
 [facility] CSW Progress Note  Visual merchandiser received a call from pt inquiring about the contact number for [facility].  CSW provided number to pt who states she plans to call today to schedule outpt services.  CSW to follow-up w/ pt at her appt on 5/19.     Quenton Bruns, LCSW Clinical Social Worker [facility]

## 2023-10-09 NOTE — Telephone Encounter (Signed)
 [place] wanted to speak to Alvera Austria, I transferred [facility] to her direct line.

## 2023-10-11 ENCOUNTER — Other Ambulatory Visit (HOSPITAL_COMMUNITY): Payer: Self-pay

## 2023-10-13 NOTE — Assessment & Plan Note (Signed)
 secondary to GI blood loss, and Anemia of Chronic Disease -longstanding history of intermittent anemia dating back at least 2008 when Hg was 10. -h/o multiple GI bleeding in 2016, required blood transfusion, hospitalization and IV Feraheme  -EGD and colonoscopy on 10/29/17 and 03/17/18 with Dr. Dominic Friendly, which were normal. Endoscopy in 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 -Due to recurrent anemia requiring IV Feraheme  and blood transfusions, she started bevacizumab  for GI bleeding on 08/10/20. She responded very well with resolved anemia and less need for iv iron. This has been held intermittently due to elevated urine protein. Last dose given 04/03/22. -She is clinically doing well, no melena or other GI bleeding lately.  Lab reviewed, hemoglobin 11.9, iron level still pending. -Will continue IV iron needed -She has cut down her marijuana and cocaine use, I again encouraged her to completely stop. Will hold beva if she is still actively using cocaine

## 2023-10-14 ENCOUNTER — Inpatient Hospital Stay: Admitting: Licensed Clinical Social Worker

## 2023-10-14 ENCOUNTER — Inpatient Hospital Stay (HOSPITAL_BASED_OUTPATIENT_CLINIC_OR_DEPARTMENT_OTHER): Admitting: Hematology

## 2023-10-14 ENCOUNTER — Inpatient Hospital Stay

## 2023-10-14 ENCOUNTER — Other Ambulatory Visit (HOSPITAL_COMMUNITY): Payer: Self-pay

## 2023-10-14 VITALS — BP 142/78 | HR 105 | Temp 98.7°F | Resp 22 | Ht 61.0 in | Wt 147.0 lb

## 2023-10-14 DIAGNOSIS — D500 Iron deficiency anemia secondary to blood loss (chronic): Secondary | ICD-10-CM

## 2023-10-14 DIAGNOSIS — C19 Malignant neoplasm of rectosigmoid junction: Secondary | ICD-10-CM

## 2023-10-14 LAB — CMP (CANCER CENTER ONLY)
ALT: 7 U/L (ref 0–44)
AST: 14 U/L — ABNORMAL LOW (ref 15–41)
Albumin: 4.1 g/dL (ref 3.5–5.0)
Alkaline Phosphatase: 87 U/L (ref 38–126)
Anion gap: 8 (ref 5–15)
BUN: 17 mg/dL (ref 8–23)
CO2: 27 mmol/L (ref 22–32)
Calcium: 9.3 mg/dL (ref 8.9–10.3)
Chloride: 105 mmol/L (ref 98–111)
Creatinine: 1.24 mg/dL — ABNORMAL HIGH (ref 0.44–1.00)
GFR, Estimated: 47 mL/min — ABNORMAL LOW (ref >60)
Glucose, Bld: 110 mg/dL — ABNORMAL HIGH (ref 70–99)
Potassium: 3.9 mmol/L (ref 3.5–5.1)
Sodium: 140 mmol/L (ref 135–145)
Total Bilirubin: 0.2 mg/dL (ref 0.0–1.2)
Total Protein: 7.8 g/dL (ref 6.5–8.1)

## 2023-10-14 LAB — FERRITIN: Ferritin: 27 ng/mL (ref 11–307)

## 2023-10-14 NOTE — Progress Notes (Signed)
 [facility] CSW Progress Note  Visual merchandiser met with patient following doctor's visit to check in.  Pt contacted [facility] and has an appointment on 5/21 w/ Les for her first counseling session.  Pt reports she is in much better spirits having made this decision and is optimistic, stating "it is time" to address her substance abuse.  CSW will continue to provide support as appropriate.      Quenton Bruns, LCSW Clinical Social Worker [facility]

## 2023-10-14 NOTE — Progress Notes (Signed)
 [facility]   Telephone:([phone] Fax:([phone]) [phone]   Clinic Follow up Note   Patient Care Team: Carleen Chary, DO as PCP - General Maris Sickle, MD as Consulting Physician (Ophthalmology) Oza Blumenthal, MD as Consulting Physician (General Surgery) Sonja Rouzerville, MD as Consulting Physician (Hematology) Comer, Judithann Novas, MD as Consulting Physician (Infectious Diseases) Albertina Hugger, MD as Consulting Physician (Gastroenterology)  Date of Service:  10/14/2023  CHIEF COMPLAINT: f/u of anemia   CURRENT THERAPY:  IV iron as needed  Oncology History   Iron deficiency anemia due to chronic blood loss secondary to GI blood loss, and Anemia of Chronic Disease -longstanding history of intermittent anemia dating back at least 2008 when Hg was 10. -h/o multiple GI bleeding in 2016, required blood transfusion, hospitalization and IV Feraheme  -EGD and colonoscopy on 10/29/17 and 03/17/18 with Dr. Dominic Friendly, which were normal. Endoscopy in [facility] and was found to have AVM in small bowel. -Due to recurrent anemia requiring IV Feraheme  and blood transfusions, she started bevacizumab  for GI bleeding on 08/10/20. She responded very well with resolved anemia and less need for iv iron. This has been held intermittently due to elevated urine protein. Last dose given 04/03/22. -She is clinically doing well, no melena or other GI bleeding lately.  Lab reviewed, hemoglobin 11.9, iron level still pending. -Will continue IV iron needed -She has cut down her marijuana and cocaine use, I again encouraged her to completely stop. Will hold beva if she is still actively using cocaine   Assessment & Plan Anemia Anemia is improving, as evidenced by increased energy levels. Previous hemoglobin was 11.4 g/dL a month ago, which is not concerning. Iron levels and kidney and liver function were assessed, with no signs of gastrointestinal bleeding. - Order blood count at next visit in June -  Monitor iron levels; if low, initiate IV iron administration  Cocaine use She is actively seeking help for cocaine use disorder, attending counseling sessions with Less at a facility on [address]. She acknowledges the impact of cocaine on her health, including its contribution to anemia and depression. - Continue counseling sessions with Less - Follow-up with our [facility] - Lab reviewed, ferritin is pending, will give IV iron if is less than 20 - Lab and follow-up in a month    SUMMARY OF ONCOLOGIC HISTORY: Oncology History   No history exists.     Discussed the use of AI scribe software for clinical note transcription with the patient, who gave verbal consent to proceed.  History of Present Illness [name] is a 73 year old female who presents for follow-up on anemia.  Her blood counts have improved, with the last hemoglobin level at 11.4 g/dL a month ago. She feels better overall, with increased energy levels and a cheerful mood. She is scheduled for another check-up in June.  She is undergoing counseling for alcohol and drug issues, attending sessions on Wednesday mornings. She has arranged transportation for these sessions and is committed to continuing her treatment.  No issues with bowel movements, no blood in stool, and no black stools.     All other systems were reviewed with the patient and are negative.  MEDICAL HISTORY:  Past Medical History:  Diagnosis Date   Allergic rhinitis    Anemia, iron deficiency 05/03/2011   2/2 GI AVMs - recieves iron infusions as well as periodic red blood cell transfusions   Anxiety    Arteriovenous  malformation of duodenum 02/25/2015   CARPAL TUNNEL RELEASE, HX OF 07/22/2008   Chest pain 10/28/2019   Colon polyps    Diabetes mellitus without complication (HCC)    type 2   Difficulty sleeping    takes trazadone for sleep   Diverticulosis    Electrolyte abnormality 01/25/2021   Fall 01/03/2020   GERD  (gastroesophageal reflux disease)    GI bleed 07/07/2020   Hepatitis C    in SVR with Harvoni  12/2014-02/2015.    History of hernia repair 08/18/2012   History of porphyria 11/30/2014   Porphyria cutanea tarda in setting of chronic Hepatitis C, frequent  IV iron infusions, and alcohol abuse    Hypertension    Hypocalcemia 10/28/2019   Hypocalcemia 01/19/2021   Hypokalemia 09/27/2014   Hypokalemia 01/19/2021   Hypomagnesemia 01/19/2021   Kidney disease 02/04/2019   Peripheral vascular disease (HCC)    Prolonged QT interval 01/19/2021   Stroke (HCC) 2010   no residual deficits   Substance use disorder    cocaine   Tachycardia 07/10/2017   Trichomonas vaginitis 04/16/2019    SURGICAL HISTORY: Past Surgical History:  Procedure Laterality Date   ABDOMINAL HYSTERECTOMY     APPLICATION OF WOUND VAC N/A 06/21/2014   Procedure: APPLICATION OF WOUND VAC;  Surgeon: Oza Blumenthal, MD;  Location: [facility] OR;  Service: General;  Laterality: N/A;   CARPAL TUNNEL RELEASE     rt hand   COLONOSCOPY WITH PROPOFOL  N/A 03/17/2018   Procedure: COLONOSCOPY WITH PROPOFOL ;  Surgeon: Albertina Hugger, MD;  Location: [facility] END[facility]COPY;  Service: Gastroenterology;  Laterality: N/A;   ENTEROSCOPY N/A 12/04/2012   Procedure: ENTEROSCOPY;  Surgeon: Almeda Aris, MD;  Location: [facility];  Service: Endoscopy;  Laterality: N/A;   ENTEROSCOPY N/A 12/18/2012   Procedure: ENTEROSCOPY;  Surgeon: Almeda Aris, MD;  Location: [facility];  Service: Endoscopy;  Laterality: N/A;   ENTEROSCOPY N/A 02/25/2015   Procedure: ENTEROSCOPY;  Surgeon: Claudette Cue, MD;  Location: [facility] ENDOSCOPY;  Service: Endoscopy;  Laterality: N/A;   ENTEROSCOPY N/A 10/29/2017   Procedure: ENTEROSCOPY;  Surgeon: Albertina Hugger, MD;  [facility]: [facility];  Service: Gastroenterology;  Laterality: N/A;   ENTEROSCOPY N/A 07/08/2020   Procedure: ENTEROSCOPY;  Surgeon: Janel Medford, MD;  Location: [facility];  Service: Endoscopy;  Laterality: N/A;    ENTEROSCOPY N/A 08/10/2023   Procedure: ENTEROSCOPY;  Surgeon: Truddie Furrow, MD;  Location: [facility] ENDOSCOPY;  Service: Gastroenterology;  Laterality: N/A;   ESOPHAGOGASTRODUODENOSCOPY  05/05/2011   Procedure: ESOPHAGOGASTRODUODENOSCOPY (EGD);  Surgeon: Laurell Pond, MD;  Location: [facility];  Service: Gastroenterology;  Laterality: N/A;  Dr. Bridgett Camps will do procedure for Dr. Nickey Barn Saturday.   ESOPHAGOGASTRODUODENOSCOPY  05/08/2011   Procedure: ESOPHAGOGASTRODUODENOSCOPY (EGD);  Surgeon: Almeda Aris;  [facility];  Service: Endoscopy;  Laterality: N/A;   ESOPHAGOGASTRODUODENOSCOPY  06/07/2011   Procedure: ESOPHAGOGASTRODUODENOSCOPY (EGD);  Surgeon: Almeda Aris, MD;  Location: [facility];  Service: Endoscopy;  Laterality: N/A;   ESOPHAGOGASTRODUODENOSCOPY  12/20/2011   Procedure: ESOPHAGOGASTRODUODENOSCOPY (EGD);  Surgeon: Almeda Aris, MD;  Location: [facility];  Service: Endoscopy;  Laterality: N/A;   EYE SURGERY Bilateral    cataracts   FLEXIBLE SIGMOIDOSCOPY  12/21/2011   Procedure: FLEXIBLE SIGMOIDOSCOPY;  Surgeon: Almeda Aris, MD;  [facility]: [facility];  Service: Endoscopy;  Laterality: N/A;   HARDWARE REMOVAL Right 08/08/2023   Procedure: HARDWARE REMOVAL RIGHT WRIST;  Surgeon: Brunilda Capra, MD;  Location: [facility];  Service: Orthopedics;  Laterality: Right;  60 MIN   HOT HEMOSTASIS  06/07/2011   Procedure: HOT HEMOSTASIS (ARGON PLASMA COAGULATION/BICAP);  Surgeon: Almeda Aris, MD;  Location: [facility];  Service: Endoscopy;  Laterality: N/A;   HOT HEMOSTASIS N/A 07/08/2020   Procedure: HOT HEMOSTASIS (ARGON PLASMA COAGULATION/BICAP);  Surgeon: Janel Medford, MD;  Location: [facility];  Service: Endoscopy;  Laterality: N/A;   HOT HEMOSTASIS N/A 08/10/2023   Procedure: EGD, WITH ARGON PLASMA COAGULATION;  Surgeon: Truddie Furrow, MD;  Location: [facility] ENDOSCOPY;  Service: Gastroenterology;  Laterality: N/A;   INCISIONAL HERNIA REPAIR N/A 06/01/2014    Procedure: ATTEMPTED LAPAROSCOPIC AND OPEN INCISIONAL HERNIA REPAIR WITH MESH;  Surgeon: Oza Blumenthal, MD;  Location: [facility] OR;  Service: General;  Laterality: N/A;   INSERTION OF MESH N/A 06/01/2014   Procedure: INSERTION OF MESH;  Surgeon: Oza Blumenthal, MD;  Location: [facility] OR;  Service: General;  Laterality: N/A;   LAPAROTOMY  02/16/2012   Procedure: EXPLORATORY LAPAROTOMY;  Surgeon: Quitman Bucy, MD;  Location: [facility];  Service: General;  Laterality: N/A;  oversewing of anastomotic leak and rigid sigmoidoscopy   LAPAROTOMY N/A 06/21/2014   Procedure: ABDOMINAL WOUND EXPLORATION;  Surgeon: Oza Blumenthal, MD;  Location: [facility] OR;  Service: General;  Laterality: N/A;   LEG AMPUTATION ABOVE KNEE     left   PARTIAL COLECTOMY  02/15/2012   Procedure: PARTIAL COLECTOMY;  Surgeon: Rogena Class, MD;  Location: [facility];  Service: General;  Laterality: N/A;   SUBMUCOSAL INJECTION  07/08/2020   Procedure: SUBMUCOSAL INJECTION;  Surgeon: Janel Medford, MD;  Location: [facility];  Service: Endoscopy;;   TONSILLECTOMY     WRIST FUSION Right 04/27/2021   Procedure: SCAPHOID EXCISION  WITH 4 BONE FUSION RIGHT WRIST;  Surgeon: Brunilda Capra, MD;  Location: [facility];  Service: Orthopedics;  Laterality: Right;    I have reviewed the social history and family history with the patient and they are unchanged from previous note.  ALLERGIES:  is allergic to ciprofloxacin , morphine  and codeine, penicillins, codeine, lisinopril , and feraheme  [ferumoxytol ].  MEDICATIONS:  Current Outpatient Medications  Medication Sig Dispense Refill   ACCU-CHEK FASTCLIX LANCETS MISC Use to test blood glucose 3 times daily. 100 each 11   acetaminophen  (TYLENOL ) 500 MG tablet Take 1,000 mg by mouth every 6 (six) hours as needed for mild pain (pain score 1-3) or moderate pain (pain score 4-6).     allopurinol  (ZYLOPRIM ) 100 MG tablet Take 1/2 tablet (50 mg total) by mouth daily.  Begin medication  after one week of colchicine  treatment. (Patient taking differently: Take 50 mg by mouth as needed (gout).) 30 tablet 2   amLODipine  (NORVASC ) 10 MG tablet Take 1 tablet (10 mg total) by mouth daily. (Patient taking differently: Take 10 mg by mouth in the morning.) 90 tablet 1   azithromycin  (ZITHROMAX ) 250 MG tablet Take 2 tablets (500 mg total) by mouth daily for 1 day, THEN 1 tablet (250 mg total) daily for 4 days. 6 tablet 0   Blood Glucose Monitoring Suppl (ACCU-CHEK GUIDE) w/Device KIT 1 each by Does not apply route 3 (three) times daily. The patient is insulin  requiring, ICD 10 code E11.65. The patient tests 3 times a day. 1 kit 1   calcium -vitamin D  (OSCAL WITH D) 500-200 MG-UNIT tablet Take 1 tablet by mouth 2 (two) times daily. 60 tablet 0   camphor-menthol  (SARNA) lotion Apply 1 Application topically as needed for itching. 222 mL  1   carvedilol  (COREG ) 6.25 MG tablet Take 1 tablet (6.25 mg total) by mouth 2 (two) times daily. 60 tablet 11   dapagliflozin  propanediol (FARXIGA ) 10 MG TABS tablet Take 1 tablet by mouth daily. (Patient taking differently: Take 10 mg by mouth in the morning.) 30 tablet 12   Dulaglutide  (TRULICITY ) 0.75 MG/0.5ML SOAJ Inject 0.75 mg into the skin once a week. (Patient taking differently: Inject 0.75 mg into the skin once a week. On Sunday) 6 mL 3   famotidine  (PEPCID ) 20 MG tablet Take 1 tablet (20 mg total) by mouth 2 (two) times daily. (Patient taking differently: Take 20 mg by mouth as needed for heartburn or indigestion.) 60 tablet 1   gabapentin  (NEURONTIN ) 400 MG capsule Take 1 capsule (400 mg total) by mouth in the morning AND 1 capsule (400 mg total) daily in the afternoon AND 2 capsules (800 mg total) before bedtime. 360 capsule 3   glucose blood (ACCU-CHEK GUIDE) test strip Use as instructed 100 strip 3   HYDROcodone -acetaminophen  (NORCO/VICODIN) 5-325 MG tablet Take 1-2 tablets by mouth every 8 (eight) hours as needed for moderate pain (4-6). 15 tablet 0    losartan  (COZAAR ) 100 MG tablet Take 1 tablet (100 mg total) by mouth daily. (Patient taking differently: Take 100 mg by mouth in the morning.) 90 tablet 1   naloxone  (NARCAN ) nasal spray 4 mg/0.1 mL Place 1 spray into the nose as directed for accidental overdose, call 911 and may repeat 2nd spray in alternate nostril if no response in 2-3 minutes. 2 each 0   pantoprazole  (PROTONIX ) 40 MG tablet Take 1 tablet (40 mg total) by mouth daily. 42 tablet 1   rosuvastatin  (CRESTOR ) 10 MG tablet Take 1 tablet (10 mg total) by mouth in the morning. 90 tablet 11   No current facility-administered medications for this visit.    PHYSICAL EXAMINATION: ECOG PERFORMANCE STATUS: 1 - Symptomatic but completely ambulatory  Vitals:   10/14/23 1106  BP: (!) 142/78  Pulse: (!) 105  Resp: (!) 22  Temp: 98.7 F (37.1 C)  SpO2: 99%   Wt Readings from Last 3 Encounters:  10/14/23 147 lb (66.7 kg)  09/19/23 151 lb 1.6 oz (68.5 kg)  08/21/23 150 lb 6.4 oz (68.2 kg)     GENERAL:alert, no distress and comfortable SKIN: skin color, texture, turgor are normal, no rashes or significant lesions EYES: normal, Conjunctiva are pink and non-injected, sclera clear Musculoskeletal:no cyanosis of digits and no clubbing  NEURO: alert & oriented x 3 with fluent speech, no focal motor/sensory deficits  Physical Exam    LABORATORY DATA:  I have reviewed the data as listed    Latest Ref Rng & Units 09/19/2023   10:29 AM 08/21/2023    9:38 AM 08/11/2023    1:47 PM  CBC  WBC 4.0 - 10.5 K/uL 6.9  8.4    Hemoglobin 12.0 - 15.0 g/dL 16.1  09.6  8.8   Hematocrit 36.0 - 46.0 % 36.4  35.7  29.2   Platelets 150 - 400 K/uL 259  470          Latest Ref Rng & Units 09/19/2023   10:29 AM 08/10/2023    7:05 AM 08/09/2023   12:30 PM  CMP  Glucose 70 - 99 mg/dL 045  409  811   BUN 8 - 23 mg/dL 19  33  38   Creatinine 0.44 - 1.00 mg/dL 9.14  7.82  9.56   Sodium 135 - 145  mmol/L 139  138  135   Potassium 3.5 - 5.1 mmol/L  4.8  4.9  5.1   Chloride 98 - 111 mmol/L 108  110  107   CO2 22 - 32 mmol/L 28  19  19    Calcium  8.9 - 10.3 mg/dL 8.6  8.4  8.2   Total Protein 6.5 - 8.1 g/dL 6.8   7.0   Total Bilirubin 0.0 - 1.2 mg/dL 0.1   0.4   Alkaline Phos 38 - 126 U/L 94   65   AST 15 - 41 U/L 15   26   ALT 0 - 44 U/L 7   13       RADIOGRAPHIC STUDIES: I have personally reviewed the radiological images as listed and agreed with the findings in the report. No results found.    Orders Placed This Encounter  Procedures   CBC with Differential/Platelet    Standing Status:   Standing    Number of Occurrences:   50    Expiration Date:   10/13/2024   All questions were answered. The patient knows to call the clinic with any problems, questions or concerns. No barriers to learning was detected. The total time spent in the appointment was 15 minutes, including review of chart and various tests results, discussions about plan of care and coordination of care plan     Sonja [place], MD 10/14/2023

## 2023-10-15 ENCOUNTER — Other Ambulatory Visit (HOSPITAL_COMMUNITY): Payer: Self-pay

## 2023-10-29 ENCOUNTER — Other Ambulatory Visit: Payer: Self-pay | Admitting: Student

## 2023-10-29 ENCOUNTER — Telehealth: Payer: Self-pay | Admitting: *Deleted

## 2023-10-29 ENCOUNTER — Other Ambulatory Visit (HOSPITAL_COMMUNITY): Payer: Self-pay

## 2023-10-29 ENCOUNTER — Other Ambulatory Visit: Payer: Self-pay

## 2023-10-29 DIAGNOSIS — I10 Essential (primary) hypertension: Secondary | ICD-10-CM

## 2023-10-29 MED ORDER — AMLODIPINE BESYLATE 10 MG PO TABS
10.0000 mg | ORAL_TABLET | Freq: Every day | ORAL | 1 refills | Status: DC
  Filled 2023-10-29: qty 90, 90d supply, fill #0
  Filled 2024-01-27: qty 90, 90d supply, fill #1

## 2023-10-29 NOTE — Telephone Encounter (Signed)
 Copied from CRM [id number]. Topic: Clinical - Order For Equipment >> Oct 29, 2023  9:43 AM [name] wrote: Reason for CRM: Patient states that she is needing [name] prescription for [facility] for her prosthetic left leg. Patient is also needing the hosiery that goes over the leg.   Phone number to [facility]: [facility]st[facility] Orthotics [phone]

## 2023-10-29 NOTE — Telephone Encounter (Signed)
 Medication sent to pharmacy

## 2023-11-13 ENCOUNTER — Other Ambulatory Visit (HOSPITAL_COMMUNITY): Payer: Self-pay

## 2023-11-17 ENCOUNTER — Other Ambulatory Visit: Payer: Self-pay | Admitting: Nurse Practitioner

## 2023-11-18 ENCOUNTER — Telehealth: Payer: Self-pay | Admitting: Hematology

## 2023-11-18 ENCOUNTER — Inpatient Hospital Stay: Attending: Hematology

## 2023-11-18 ENCOUNTER — Inpatient Hospital Stay: Admitting: Physician Assistant

## 2023-11-18 NOTE — Telephone Encounter (Signed)
 Scheduled appointments per incoming call due to missed appointment. Talked with the patient and she is aware of the made appointments.

## 2023-11-21 ENCOUNTER — Other Ambulatory Visit (HOSPITAL_COMMUNITY): Payer: Self-pay

## 2023-11-22 ENCOUNTER — Other Ambulatory Visit (HOSPITAL_COMMUNITY): Payer: Self-pay

## 2023-11-22 ENCOUNTER — Other Ambulatory Visit: Payer: Self-pay

## 2023-11-22 ENCOUNTER — Ambulatory Visit: Admitting: Student

## 2023-11-22 VITALS — BP 132/79 | HR 79 | Temp 98.5°F | Ht 61.0 in | Wt 150.3 lb

## 2023-11-22 DIAGNOSIS — N183 Chronic kidney disease, stage 3 unspecified: Secondary | ICD-10-CM

## 2023-11-22 DIAGNOSIS — R011 Cardiac murmur, unspecified: Secondary | ICD-10-CM

## 2023-11-22 DIAGNOSIS — F1990 Other psychoactive substance use, unspecified, uncomplicated: Secondary | ICD-10-CM

## 2023-11-22 DIAGNOSIS — I10 Essential (primary) hypertension: Secondary | ICD-10-CM | POA: Diagnosis not present

## 2023-11-22 DIAGNOSIS — E1122 Type 2 diabetes mellitus with diabetic chronic kidney disease: Secondary | ICD-10-CM | POA: Diagnosis not present

## 2023-11-22 DIAGNOSIS — F17210 Nicotine dependence, cigarettes, uncomplicated: Secondary | ICD-10-CM

## 2023-11-22 DIAGNOSIS — N1832 Chronic kidney disease, stage 3b: Secondary | ICD-10-CM

## 2023-11-22 DIAGNOSIS — F17200 Nicotine dependence, unspecified, uncomplicated: Secondary | ICD-10-CM

## 2023-11-22 DIAGNOSIS — Z7985 Long-term (current) use of injectable non-insulin antidiabetic drugs: Secondary | ICD-10-CM

## 2023-11-22 DIAGNOSIS — Z7984 Long term (current) use of oral hypoglycemic drugs: Secondary | ICD-10-CM

## 2023-11-22 LAB — POCT GLYCOSYLATED HEMOGLOBIN (HGB A1C): Hemoglobin A1C: 5.8 % — AB (ref 4.0–5.6)

## 2023-11-22 LAB — GLUCOSE, CAPILLARY: Glucose-Capillary: 95 mg/dL (ref 70–99)

## 2023-11-22 MED ORDER — VARENICLINE TARTRATE (STARTER) 0.5 MG X 11 & 1 MG X 42 PO TBPK
ORAL_TABLET | ORAL | 0 refills | Status: DC
  Filled 2023-11-22: qty 1, fill #0

## 2023-11-22 MED ORDER — VARENICLINE TARTRATE (STARTER) 0.5 MG X 11 & 1 MG X 42 PO TBPK
ORAL_TABLET | ORAL | 0 refills | Status: DC
  Filled 2023-11-22: qty 53, 30d supply, fill #0

## 2023-11-22 MED ORDER — ACCU-CHEK FASTCLIX LANCETS MISC
11 refills | Status: AC
Start: 2023-11-22 — End: ?

## 2023-11-22 NOTE — Assessment & Plan Note (Signed)
 Continues to go to [employer], getting a lot out of them.

## 2023-11-22 NOTE — Patient Instructions (Signed)
 Return in about 3 months (around 02/22/2024) for smoking cessation, hypertension.  Remember to bring all of the medications that you take (including over the counter medications and supplements) with you to every clinic visit.  This after visit summary is an important review of tests, referrals, and medication changes that were discussed during your visit. If you have questions or concerns, call [phone]. Outside of clinic business hours, call the main hospital at [phone] and ask the operator for the on-call internal medicine resident.   Ozell Kung MD 11/22/2023, 9:24 AM

## 2023-11-22 NOTE — Assessment & Plan Note (Signed)
 132/79 today. Continue amlodipine  10 mg daily, carvedilol  6.25 mg BID, losartan  100 mg daily.

## 2023-11-22 NOTE — Assessment & Plan Note (Addendum)
 Well-controlled. A1c today. Continue Trulicity  0.75 mg weekly and Farxiga  10 mg daily. Consider finerenone for CKD with proteinuria due to diabetes.

## 2023-11-22 NOTE — Assessment & Plan Note (Addendum)
 Prior quit attempts: yes, quit for a couple of days Triggers and coping strategies: smokes in the bathroom, after meals, due to boredom Planned quit date: August 31 Pharmacotherapy: nicotine  patches haven't worked, going to try Chantix Follow-up in three months. > 10 minutes spent on counseling.

## 2023-11-22 NOTE — Assessment & Plan Note (Signed)
 Incidentally discovered. No history of this. No signs of heart failure syndrome. Check echocardiogram.

## 2023-11-22 NOTE — Progress Notes (Signed)
 Patient name: [name] Date of birth: 1951-03-25 Date of visit: 11/22/23  Subjective   Chief concern: my right foot swells up  Swelling occurs when she's sitting. Ongoing for 2 months. Not daily. Goes away when she lays down or moves around on her feet. Painless.  Recent history of GI bleeding, no bleeding of late.  Recently started [employer]  Continues to smoke cigarettes. Switched from [employer] to [employer] when [employer] went up to $10/pack. Pack lasts 1.5 weeks.  Feeling well overall, enjoying life.  Review of Systems  Respiratory:  Negative for cough and shortness of breath.   Cardiovascular:  Positive for leg swelling. Negative for chest pain, orthopnea and PND.  Gastrointestinal:  Negative for blood in stool.  Genitourinary:  Negative for hematuria.  Psychiatric/Behavioral:  The patient does not have insomnia.     Current Outpatient Medications  Medication Instructions   ACCU-CHEK FASTCLIX LANCETS MISC Use to test blood glucose 3 times daily.   acetaminophen  (TYLENOL ) 1,000 mg, Every 6 hours PRN   allopurinol  (ZYLOPRIM ) 100 MG tablet Take 1/2 tablet (50 mg total) by mouth daily.  Begin medication after one week of colchicine  treatment.   amLODipine  (NORVASC ) 10 mg, Oral, Daily   Blood Glucose Monitoring Suppl (ACCU-CHEK GUIDE) w/Device KIT 1 each, Does not apply, 3 times daily, The patient is insulin  requiring, ICD 10 code E11.65. The patient tests 3 times a day.   calcium -vitamin D  (OSCAL WITH D) 500-200 MG-UNIT tablet 1 tablet, Oral, 2 times daily   camphor-menthol  (SARNA) lotion 1 Application, Topical, As needed   carvedilol  (COREG ) 6.25 mg, Oral, 2 times daily   dapagliflozin  propanediol (FARXIGA ) 10 MG TABS tablet Take 1 tablet by mouth daily.   famotidine  (PEPCID ) 20 mg, Oral, 2 times daily   gabapentin  (NEURONTIN ) 400 MG capsule Take 1 capsule (400 mg total) by mouth in the morning AND 1 capsule (400 mg total) daily in the afternoon AND 2 capsules (800  mg total) before bedtime.   glucose blood (ACCU-CHEK GUIDE) test strip Use as instructed   HYDROcodone -acetaminophen  (NORCO/VICODIN) 5-325 MG tablet Take 1-2 tablets by mouth every 8 (eight) hours as needed for moderate pain (4-6).   losartan  (COZAAR ) 100 mg, Oral, Daily   naloxone  (NARCAN ) nasal spray 4 mg/0.1 mL Place 1 spray into the nose as directed for accidental overdose, call 911 and may repeat 2nd spray in alternate nostril if no response in 2-3 minutes.   pantoprazole  (PROTONIX ) 40 mg, Oral, Daily   rosuvastatin  (CRESTOR ) 10 mg, Oral, Every morning   Trulicity  0.75 mg, Subcutaneous, Weekly     Objective  Today's Vitals   11/22/23 0846  BP: 132/79  Pulse: 79  Temp: 98.5 F (36.9 C)  TempSrc: Oral  SpO2: 96%  Weight: 150 lb 4.8 oz (68.2 kg)  Height: 5' 1 (1.549 m)  PainSc: 0-No pain  Body mass index is 28.4 kg/m.   Physical Exam Constitutional:      General: She is not in acute distress.    Appearance: Normal appearance.  Neck:     Vascular: No carotid bruit.   Cardiovascular:     Rate and Rhythm: Normal rate and regular rhythm.     Pulses: Normal pulses.     Heart sounds: Murmur (systolic RUSB) heard.  Pulmonary:     Effort: Pulmonary effort is normal.     Breath sounds: Normal breath sounds. No wheezing or rales.   Musculoskeletal:     Right lower leg: No edema.  Comments: Left AKA  Lymphadenopathy:     Cervical: No cervical adenopathy.   Skin:    General: Skin is warm and dry.   Neurological:     Mental Status: She is alert. Mental status is at baseline.     Cranial Nerves: No facial asymmetry.     Motor: No tremor.   Psychiatric:        Mood and Affect: Mood normal.        Behavior: Behavior normal.    Diabetic Foot Exam - Simple   Simple Foot Form Diabetic Foot exam was performed with the following findings: Yes 11/22/2023  9:53 AM  Visual Inspection No deformities, no ulcerations, no other skin breakdown bilaterally: Yes Sensation  Testing Intact to touch and monofilament testing bilaterally: Yes Pulse Check Posterior Tibialis and Dorsalis pulse intact bilaterally: Yes Comments Right foot normal Left AKA       Assessment & Plan  Problem List Items Addressed This Visit     Essential hypertension, benign (Chronic)   132/79 today. Continue amlodipine  10 mg daily, carvedilol  6.25 mg BID, losartan  100 mg daily.       Substance use disorder (Chronic)   Continues to go to [employer], getting a lot out of them.      Tobacco use disorder - Primary (Chronic)   Prior quit attempts: yes, quit for a couple of days Triggers and coping strategies: smokes in the bathroom, after meals, due to boredom Planned quit date: August 31 Pharmacotherapy: nicotine  patches haven't worked, going to try Chantix Follow-up in three months. > 10 minutes spent on counseling.       Relevant Medications   Varenicline Tartrate, Starter, (CHANTIX STARTING MONTH PAK) 0.5 MG X 11 & 1 MG X 42 TBPK   Type 2 diabetes mellitus with chronic kidney disease (HCC)   Well-controlled. A1c today. Continue Trulicity  0.75 mg weekly and Farxiga  10 mg daily. Consider finerenone for CKD with proteinuria due to diabetes.      Relevant Medications   Accu-Chek FastClix Lancets MISC   Other Relevant Orders   POCT glycosylated hemoglobin (Hb A1C) (Completed)   Systolic murmur   Incidentally discovered. No history of this. No signs of heart failure syndrome. Check echocardiogram.      Relevant Orders   ECHOCARDIOGRAM COMPLETE   Return in about 3 months (around 02/22/2024) for smoking cessation, hypertension.  Ozell Kung MD 11/22/2023, 8:54 AM

## 2023-11-22 NOTE — Progress Notes (Signed)
 Internal Medicine Clinic Attending  Case discussed with the resident at the time of the visit.  We reviewed the resident's history and exam and pertinent patient test results.  I agree with the assessment, diagnosis, and plan of care documented in the resident's note.

## 2023-11-25 NOTE — Assessment & Plan Note (Signed)
 secondary to GI blood loss, and Anemia of Chronic Disease -longstanding history of intermittent anemia dating back at least 2008 when Hg was 10. -h/o multiple GI bleeding in 2016, required blood transfusion, hospitalization and IV Feraheme  -EGD and colonoscopy on 10/29/17 and 03/17/18 with Dr. Dominic Friendly, which were normal. Endoscopy in 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 -Due to recurrent anemia requiring IV Feraheme  and blood transfusions, she started bevacizumab  for GI bleeding on 08/10/20. She responded very well with resolved anemia and less need for iv iron. This has been held intermittently due to elevated urine protein. Last dose given 04/03/22. -She is clinically doing well, no melena or other GI bleeding lately.  Lab reviewed, hemoglobin 11.9, iron level still pending. -Will continue IV iron needed -She has cut down her marijuana and cocaine use, I again encouraged her to completely stop. Will hold beva if she is still actively using cocaine

## 2023-11-26 ENCOUNTER — Inpatient Hospital Stay: Attending: Hematology

## 2023-11-26 ENCOUNTER — Inpatient Hospital Stay (HOSPITAL_BASED_OUTPATIENT_CLINIC_OR_DEPARTMENT_OTHER): Admitting: Hematology

## 2023-11-26 ENCOUNTER — Encounter

## 2023-11-26 VITALS — BP 126/72 | HR 83 | Temp 98.0°F | Resp 17 | Ht 61.0 in | Wt 154.2 lb

## 2023-11-26 DIAGNOSIS — K922 Gastrointestinal hemorrhage, unspecified: Secondary | ICD-10-CM | POA: Diagnosis not present

## 2023-11-26 DIAGNOSIS — F1490 Cocaine use, unspecified, uncomplicated: Secondary | ICD-10-CM | POA: Insufficient documentation

## 2023-11-26 DIAGNOSIS — D500 Iron deficiency anemia secondary to blood loss (chronic): Secondary | ICD-10-CM

## 2023-11-26 LAB — CBC WITH DIFFERENTIAL/PLATELET
Abs Immature Granulocytes: 0.09 10*3/uL — ABNORMAL HIGH (ref 0.00–0.07)
Basophils Absolute: 0 10*3/uL (ref 0.0–0.1)
Basophils Relative: 0 %
Eosinophils Absolute: 0.2 10*3/uL (ref 0.0–0.5)
Eosinophils Relative: 3 %
HCT: 28.8 % — ABNORMAL LOW (ref 36.0–46.0)
Hemoglobin: 8.8 g/dL — ABNORMAL LOW (ref 12.0–15.0)
Immature Granulocytes: 1 %
Lymphocytes Relative: 11 %
Lymphs Abs: 0.8 10*3/uL (ref 0.7–4.0)
MCH: 25.7 pg — ABNORMAL LOW (ref 26.0–34.0)
MCHC: 30.6 g/dL (ref 30.0–36.0)
MCV: 84.2 fL (ref 80.0–100.0)
Monocytes Absolute: 0.8 10*3/uL (ref 0.1–1.0)
Monocytes Relative: 11 %
Neutro Abs: 5.6 10*3/uL (ref 1.7–7.7)
Neutrophils Relative %: 74 %
Platelets: 367 10*3/uL (ref 150–400)
RBC: 3.42 MIL/uL — ABNORMAL LOW (ref 3.87–5.11)
RDW: 15.7 % — ABNORMAL HIGH (ref 11.5–15.5)
WBC: 7.5 10*3/uL (ref 4.0–10.5)
nRBC: 0 % (ref 0.0–0.2)

## 2023-11-26 LAB — FERRITIN: Ferritin: 10 ng/mL — ABNORMAL LOW (ref 11–307)

## 2023-11-26 NOTE — Progress Notes (Signed)
 [facility]   Telephone:([phone]) [phone] [phone]) [phone]   Clinic Follow up Note   Patient Care Team: Harrie Bruckner, DO as PCP - General Octavia Charleston, MD as Consulting Physician (Ophthalmology) Vernetta Berg, MD as Consulting Physician (General Surgery) Lanny Callander, MD as Consulting Physician (Hematology) Comer, [place] ORN, MD as Consulting Physician (Infectious Diseases) Legrand Victory LITTIE DOUGLAS, MD as Consulting Physician (Gastroenterology)  Date of Service:  11/26/2023  CHIEF COMPLAINT: f/u of anemia   CURRENT THERAPY:  Iv feraheme  if ferritin<30 Beva on hold due to cocaine abuse   Hematology History   Iron deficiency anemia due to chronic blood loss secondary to GI blood loss, and Anemia of Chronic Disease -longstanding history of intermittent anemia dating back at least 2008 when Hg was 10. -h/o multiple GI bleeding in 2016, required blood transfusion, hospitalization and IV Feraheme  -EGD and colonoscopy on 10/29/17 and 03/17/18 with Dr. Legrand, which were normal. 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 -Due to recurrent anemia requiring IV Feraheme  and blood transfusions, she started bevacizumab  for GI bleeding on 08/10/20. She responded very well with resolved anemia and less need for iv iron. This has been held intermittently due to elevated urine protein. Last dose given 04/03/22. -She is clinically doing well, no melena or other GI bleeding lately.  Lab reviewed, hemoglobin 11.9, iron level still pending. -Will continue IV iron needed -She has cut down her marijuana and cocaine use, I again encouraged her to completely stop. Will hold beva if she is still actively using cocaine   Assessment & Plan Iron deficiency anemia Iron deficiency anemia with a hemoglobin level of 8.8, indicating moderate anemia. Iron levels are low, though not as severe as in March. No blood transfusion is required at this time, but intervention is necessary to  prevent further decline. - Administer IV iron to address deficiency. - Administer Benadryl  and Tylenol  prior to infusion to prevent adverse reactions. - Schedule IV iron infusion at [facility]. - Check blood count in three weeks to monitor hemoglobin levels. - Follow up with blood count checks once a month. - Schedule follow-up appointment in three months.   Plan  - Lab reviewed, hemoglobin has dropped to 8.8, she is asymptomatic.  Ferritin still pending, but overall likely low. - I will schedule 2 dose of Feraheme  at [employer] [facility] in the next 2 to 3 weeks - Repeat lab in 3 weeks, then monthly - Follow-up in 3 months. - She has seen drug rehab clinic, I encouraged her to participate.    Discussed the use of AI scribe software for clinical note transcription with the patient, who gave verbal consent to proceed.  History of Present Illness [name] is a 73 year old female with anemia who presents for follow-up.  Her current hemoglobin level is 8.8, indicating moderate anemia. She last received iron supplementation in March and has previously undergone IV iron treatment at [facility].     All other systems were reviewed with the patient and are negative.  MEDICAL HISTORY:  Past Medical History:  Diagnosis Date   Allergic rhinitis    Anemia, iron deficiency 05/03/2011   2/2 GI AVMs - recieves iron infusions as well as periodic red blood cell transfusions   Anxiety    Arteriovenous malformation of duodenum 02/25/2015   CARPAL TUNNEL RELEASE, HX OF 07/22/2008   Chest pain 10/28/2019   Colon polyps    Diabetes mellitus without complication (HCC)  type 2   Difficulty sleeping    takes trazadone for sleep   Diverticulosis    Electrolyte abnormality 01/25/2021   Fall 01/03/2020   GERD (gastroesophageal reflux disease)    GI bleed 07/07/2020   Hepatitis C    in SVR with [facility]  12/2014-02/2015.    History of hernia repair 08/18/2012   History of porphyria 11/30/2014    Porphyria cutanea tarda in setting of chronic Hepatitis C, frequent  IV iron infusions, and alcohol abuse    Hypertension    Hypocalcemia 10/28/2019   Hypocalcemia 01/19/2021   Hypokalemia 09/27/2014   Hypokalemia 01/19/2021   Hypomagnesemia 01/19/2021   Kidney disease 02/04/2019   Peripheral vascular disease (HCC)    Prolonged QT interval 01/19/2021   Stroke (HCC) 2010   no residual deficits   Substance use disorder    cocaine   Tachycardia 07/10/2017   Trichomonas vaginitis 04/16/2019    SURGICAL HISTORY: Past Surgical History:  Procedure Laterality Date   ABDOMINAL HYSTERECTOMY     APPLICATION OF WOUND VAC N/A 06/21/2014   Procedure: APPLICATION OF WOUND VAC;  Surgeon: Vicenta Poli, MD;  Location: [facility] OR;  Service: General;  Laterality: N/A;   CARPAL TUNNEL RELEASE     rt hand   COLONOSCOPY WITH PROPOFOL  N/A 03/17/2018   Procedure: COLONOSCOPY WITH PROPOFOL ;  Surgeon: Legrand Victory LITTIE DOUGLAS, MD;  Location: [facility];  Service: Gastroenterology;  Laterality: N/A;   ENTEROSCOPY N/A 12/04/2012   Procedure: ENTEROSCOPY;  Surgeon: Belvie JONETTA Just, MD;  Location: [facility];  Service: Endoscopy;  Laterality: N/A;   ENTEROSCOPY N/A 12/18/2012   Procedure: ENTEROSCOPY;  Surgeon: Belvie JONETTA Just, MD;  Location: [facility];  Service: Endoscopy;  Laterality: N/A;   ENTEROSCOPY N/A 02/25/2015   Procedure: ENTEROSCOPY;  Surgeon: Lamar JONETTA Aho, MD;  Location: [facility] END[facility]COPY;  Service: Endoscopy;  Laterality: N/A;   ENTEROSCOPY N/A 10/29/2017   Procedure: ENTEROSCOPY;  Surgeon: Legrand Victory LITTIE DOUGLAS, MD;  Location: [facility];  Service: Gastroenterology;  Laterality: N/A;   ENTEROSCOPY N/A 07/08/2020   Procedure: ENTEROSCOPY;  Surgeon: Teressa Toribio SQUIBB, MD;  Location: [facility];  Service: Endoscopy;  Laterality: N/A;   ENTEROSCOPY N/A 08/10/2023   Procedure: ENTEROSCOPY;  Surgeon: Suzann Inocente HERO, MD;  Location: [facility] ENDOSCOPY;  Service: Gastroenterology;  Laterality: N/A;   ESOPHAGOGASTRODUODENOSCOPY   05/05/2011   Procedure: ESOPHAGOGASTRODUODENOSCOPY (EGD);  Surgeon: Gordy Starch, MD;  Location: [facility];  Service: Gastroenterology;  Laterality: N/A;  Dr. Starch will do procedure for Dr. Just Saturday.   ESOPHAGOGASTRODUODENOSCOPY  05/08/2011   Procedure: ESOPHAGOGASTRODUODENOSCOPY (EGD);  Surgeon: Belvie JONETTA Just;  Location: [facility];  Service: Endoscopy;  Laterality: N/A;   ESOPHAGOGASTRODUODENOSCOPY  06/07/2011   Procedure: ESOPHAGOGASTRODUODENOSCOPY (EGD);  Surgeon: Belvie JONETTA Just, MD;  Location: [facility];  Service: Endoscopy;  Laterality: N/A;   ESOPHAGOGASTRODUODENOSCOPY  12/20/2011   Procedure: ESOPHAGOGASTRODUODENOSCOPY (EGD);  Surgeon: Belvie JONETTA Just, MD;  Location: [facility];  Service: Endoscopy;  Laterality: N/A;   EYE SURGERY Bilateral    cataracts   FLEXIBLE SIGMOIDOSCOPY  12/21/2011   Procedure: FLEXIBLE SIGMOIDOSCOPY;  Surgeon: Belvie JONETTA Just, MD;  Location: [facility];  Service: Endoscopy;  Laterality: N/A;   HARDWARE REMOVAL Right 08/08/2023   Procedure: HARDWARE REMOVAL RIGHT WRIST;  Surgeon: Murrell Drivers, MD;  Location: [facility];  Service: Orthopedics;  Laterality: Right;  60 MIN   HOT HEMOSTASIS  06/07/2011   Procedure: HOT HEMOSTASIS (ARGON PLASMA COAGULATION/BICAP);  Surgeon: Belvie JONETTA Just, MD;  Location: [facility];  Service: Endoscopy;  Laterality: N/A;   HOT HEMOSTASIS N/A 07/08/2020   Procedure: HOT HEMOSTASIS (ARGON PLASMA COAGULATION/BICAP);  Surgeon: Teressa Toribio SQUIBB, MD;  Location: [facility];  Service: Endoscopy;  Laterality: N/A;   HOT HEMOSTASIS N/A 08/10/2023   Procedure: EGD, WITH ARGON PLASMA COAGULATION;  Surgeon: Suzann Inocente HERO, MD;  Location: [facility] ENDOSCOPY;  Service: Gastroenterology;  Laterality: N/A;   INCISIONAL HERNIA REPAIR N/A 06/01/2014   Procedure: ATTEMPTED LAPAROSCOPIC AND OPEN INCISIONAL HERNIA REPAIR WITH MESH;  Surgeon: Vicenta Poli, MD;  Location: [facility] OR;  Service: General;  Laterality: N/A;   INSERTION OF  MESH N/A 06/01/2014   Procedure: INSERTION OF MESH;  Surgeon: Vicenta Poli, MD;  Location: [facility];  Service: General;  Laterality: N/A;   LAPAROTOMY  02/16/2012   Procedure: EXPLORATORY LAPAROTOMY;  Surgeon: Morene ONEIDA Olives, MD;  Location: [facility];  Service: General;  Laterality: N/A;  oversewing of anastomotic leak and rigid sigmoidoscopy   LAPAROTOMY N/A 06/21/2014   Procedure: ABDOMINAL WOUND EXPLORATION;  Surgeon: Vicenta Poli, MD;  Location: [facility];  Service: General;  Laterality: N/A;   LEG AMPUTATION ABOVE KNEE     left   PARTIAL COLECTOMY  02/15/2012   Procedure: PARTIAL COLECTOMY;  Surgeon: Vicenta DELENA Poli, MD;  Location: [facility];  Service: General;  Laterality: N/A;   SUBMUCOSAL INJECTION  07/08/2020   Procedure: SUBMUCOSAL INJECTION;  Surgeon: Teressa Toribio SQUIBB, MD;  Location: [facility];  Service: Endoscopy;;   TONSILLECTOMY     WRIST FUSION Right 04/27/2021   Procedure: SCAPHOID EXCISION  WITH 4 BONE FUSION RIGHT WRIST;  Surgeon: Murrell Drivers, MD;  Location: [facility];  Service: Orthopedics;  Laterality: Right;    I have reviewed the social history and family history with the patient and they are unchanged from previous note.  ALLERGIES:  is allergic to ciprofloxacin , morphine  and codeine, penicillins, codeine, lisinopril , and feraheme  [ferumoxytol ].  MEDICATIONS:  Current Outpatient Medications  Medication Sig Dispense Refill   Accu-Chek FastClix Lancets MISC Use to test blood glucose 3 times daily. 100 each 11   acetaminophen  (TYLENOL ) 500 MG tablet Take 1,000 mg by mouth every 6 (six) hours as needed for mild pain (pain score 1-3) or moderate pain (pain score 4-6).     allopurinol  (ZYLOPRIM ) 100 MG tablet Take 1/2 tablet (50 mg total) by mouth daily.  Begin medication after one week of colchicine  treatment. (Patient taking differently: Take 50 mg by mouth as needed (gout).) 30 tablet 2   amLODipine  (NORVASC ) 10 MG tablet Take 1 tablet (10 mg total) by  mouth daily. 90 tablet 1   Blood Glucose Monitoring Suppl (ACCU-CHEK GUIDE) w/Device KIT 1 each by Does not apply route 3 (three) times daily. The patient is insulin  requiring, ICD 10 code E11.65. The patient tests 3 times a day. 1 kit 1   calcium -vitamin D  (OSCAL WITH D) 500-200 MG-UNIT tablet Take 1 tablet by mouth 2 (two) times daily. 60 tablet 0   camphor-menthol  (SARNA) lotion Apply 1 Application topically as needed for itching. 222 mL 1   carvedilol  (COREG ) 6.25 MG tablet Take 1 tablet (6.25 mg total) by mouth 2 (two) times daily. 60 tablet 11   dapagliflozin  propanediol (FARXIGA ) 10 MG TABS tablet Take 1 tablet by mouth daily. (Patient taking differently: Take 10 mg by mouth in the morning.) 30 tablet 12   Dulaglutide  (TRULICITY ) 0.75 MG/0.5ML SOAJ Inject 0.75 mg into the skin once a week. (Patient taking differently: Inject 0.75 mg  into the skin once a week. On Sunday) 6 mL 3   famotidine  (PEPCID ) 20 MG tablet Take 1 tablet (20 mg total) by mouth 2 (two) times daily. (Patient taking differently: Take 20 mg by mouth as needed for heartburn or indigestion.) 60 tablet 1   gabapentin  (NEURONTIN ) 400 MG capsule Take 1 capsule (400 mg total) by mouth in the morning AND 1 capsule (400 mg total) daily in the afternoon AND 2 capsules (800 mg total) before bedtime. 360 capsule 3   glucose blood (ACCU-CHEK GUIDE) test strip Use as instructed 100 strip 3   losartan  (COZAAR ) 100 MG tablet Take 1 tablet (100 mg total) by mouth daily. (Patient taking differently: Take 100 mg by mouth in the morning.) 90 tablet 1   pantoprazole  (PROTONIX ) 40 MG tablet Take 1 tablet (40 mg total) by mouth daily. 42 tablet 1   rosuvastatin  (CRESTOR ) 10 MG tablet Take 1 tablet (10 mg total) by mouth in the morning. 90 tablet 11   Varenicline Tartrate, Starter, (CHANTIX STARTING MONTH PAK) 0.5 MG X 11 & 1 MG X 42 TBPK Starting 1 week prior to quitting smoking, take by mouth as directed on the package. 53 each 0   No current  facility-administered medications for this visit.    PHYSICAL EXAMINATION: ECOG PERFORMANCE STATUS: 1 - Symptomatic but completely ambulatory  Vitals:   11/26/23 0844  BP: 126/72  Pulse: 83  Resp: 17  Temp: 98 F (36.7 C)  SpO2: 99%   Wt Readings from Last 3 Encounters:  11/26/23 154 lb 3.2 oz (69.9 kg)  11/22/23 150 lb 4.8 oz (68.2 kg)  10/14/23 147 lb (66.7 kg)     GENERAL:alert, no distress and comfortable SKIN: skin color, texture, turgor are normal, no rashes or significant lesions EYES: normal, Conjunctiva are pink and non-injected, sclera clear Musculoskeletal:no cyanosis of digits and no clubbing  NEURO: alert & oriented x 3 with fluent speech, no focal motor/sensory deficits  Physical Exam    LABORATORY DATA:  I have reviewed the data as listed    Latest Ref Rng & Units 11/26/2023    8:28 AM 09/19/2023   10:29 AM 08/21/2023    9:38 AM  CBC  WBC 4.0 - 10.5 K/uL 7.5  6.9  8.4   Hemoglobin 12.0 - 15.0 g/dL 8.8  88.5  89.0   Hematocrit 36.0 - 46.0 % 28.8  36.4  35.7   Platelets 150 - 400 K/uL 367  259  470         Latest Ref Rng & Units 10/14/2023   10:27 AM 09/19/2023   10:29 AM 08/10/2023    7:05 AM  CMP  Glucose 70 - 99 mg/dL 889  896  884   BUN 8 - 23 mg/dL 17  19  33   Creatinine 0.44 - 1.00 mg/dL 8.75  8.45  8.26   Sodium 135 - 145 mmol/L 140  139  138   Potassium 3.5 - 5.1 mmol/L 3.9  4.8  4.9   Chloride 98 - 111 mmol/L 105  108  110   CO2 22 - 32 mmol/L 27  28  19    Calcium  8.9 - 10.3 mg/dL 9.3  8.6  8.4   Total Protein 6.5 - 8.1 g/dL 7.8  6.8    Total Bilirubin 0.0 - 1.2 mg/dL 0.2  0.1    Alkaline Phos 38 - 126 U/L 87  94    AST 15 - 41 U/L 14  15  ALT 0 - 44 U/L 7  7        RADIOGRAPHIC STUDIES: I have personally reviewed the radiological images as listed and agreed with the findings in the report. No results found.    No orders of the defined types were placed in this encounter.  All questions were answered. The patient knows to  call the clinic with any problems, questions or concerns. No barriers to learning was detected. The total time spent in the appointment was 25 minutes, including review of chart and various tests results, discussions about plan of care and coordination of care plan     Onita Mattock, MD 11/26/2023

## 2023-12-03 ENCOUNTER — Other Ambulatory Visit: Payer: Self-pay

## 2023-12-03 ENCOUNTER — Other Ambulatory Visit: Payer: Self-pay | Admitting: Student

## 2023-12-03 ENCOUNTER — Other Ambulatory Visit (HOSPITAL_COMMUNITY): Payer: Self-pay

## 2023-12-03 MED ORDER — ALLOPURINOL 100 MG PO TABS
50.0000 mg | ORAL_TABLET | Freq: Every day | ORAL | 2 refills | Status: DC
  Filled 2023-12-03: qty 30, 60d supply, fill #0
  Filled 2024-01-27: qty 30, 60d supply, fill #1
  Filled 2024-03-25: qty 30, 60d supply, fill #2

## 2023-12-03 NOTE — Telephone Encounter (Signed)
 Medication sent to pharmacy

## 2023-12-06 ENCOUNTER — Telehealth: Payer: Self-pay | Admitting: Hematology

## 2023-12-06 NOTE — Telephone Encounter (Signed)
 Called to reschedule patient appointment to due provider pal  request. I talked  to patient and they are aware of the changes that was made to the upcoming appointment

## 2023-12-07 ENCOUNTER — Other Ambulatory Visit: Payer: Self-pay | Admitting: Student

## 2023-12-09 ENCOUNTER — Other Ambulatory Visit: Payer: Self-pay

## 2023-12-11 ENCOUNTER — Other Ambulatory Visit (HOSPITAL_COMMUNITY): Payer: Self-pay

## 2023-12-11 ENCOUNTER — Other Ambulatory Visit: Payer: Self-pay

## 2023-12-11 MED ORDER — PANTOPRAZOLE SODIUM 40 MG PO TBEC
40.0000 mg | DELAYED_RELEASE_TABLET | Freq: Every day | ORAL | 1 refills | Status: DC
  Filled 2023-12-11: qty 42, 42d supply, fill #0
  Filled 2024-01-15: qty 42, 42d supply, fill #1

## 2023-12-15 NOTE — Assessment & Plan Note (Signed)
 secondary to GI blood loss, and Anemia of Chronic Disease -longstanding history of intermittent anemia dating back at least 2008 when Hg was 10. -h/o multiple GI bleeding in 2016, required blood transfusion, hospitalization and IV Feraheme  -EGD and colonoscopy on 10/29/17 and 03/17/18 with Dr. Dominic Friendly, which were normal. Endoscopy in 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 -Due to recurrent anemia requiring IV Feraheme  and blood transfusions, she started bevacizumab  for GI bleeding on 08/10/20. She responded very well with resolved anemia and less need for iv iron. This has been held intermittently due to elevated urine protein. Last dose given 04/03/22. -She is clinically doing well, no melena or other GI bleeding lately.  Lab reviewed, hemoglobin 11.9, iron level still pending. -Will continue IV iron needed -She has cut down her marijuana and cocaine use, I again encouraged her to completely stop. Will hold beva if she is still actively using cocaine

## 2023-12-16 ENCOUNTER — Inpatient Hospital Stay

## 2023-12-16 ENCOUNTER — Inpatient Hospital Stay (HOSPITAL_BASED_OUTPATIENT_CLINIC_OR_DEPARTMENT_OTHER): Admitting: Hematology

## 2023-12-16 VITALS — BP 136/60 | HR 83 | Temp 98.3°F | Resp 16 | Ht 61.0 in | Wt 151.2 lb

## 2023-12-16 DIAGNOSIS — D500 Iron deficiency anemia secondary to blood loss (chronic): Secondary | ICD-10-CM | POA: Diagnosis not present

## 2023-12-16 LAB — CBC WITH DIFFERENTIAL/PLATELET
Abs Immature Granulocytes: 0.06 K/uL (ref 0.00–0.07)
Basophils Absolute: 0.1 K/uL (ref 0.0–0.1)
Basophils Relative: 1 %
Eosinophils Absolute: 0.2 K/uL (ref 0.0–0.5)
Eosinophils Relative: 2 %
HCT: 28.3 % — ABNORMAL LOW (ref 36.0–46.0)
Hemoglobin: 8.6 g/dL — ABNORMAL LOW (ref 12.0–15.0)
Immature Granulocytes: 1 %
Lymphocytes Relative: 17 %
Lymphs Abs: 1.4 K/uL (ref 0.7–4.0)
MCH: 24.6 pg — ABNORMAL LOW (ref 26.0–34.0)
MCHC: 30.4 g/dL (ref 30.0–36.0)
MCV: 80.9 fL (ref 80.0–100.0)
Monocytes Absolute: 1.4 K/uL — ABNORMAL HIGH (ref 0.1–1.0)
Monocytes Relative: 17 %
Neutro Abs: 5.1 K/uL (ref 1.7–7.7)
Neutrophils Relative %: 62 %
Platelets: 354 K/uL (ref 150–400)
RBC: 3.5 MIL/uL — ABNORMAL LOW (ref 3.87–5.11)
RDW: 16.5 % — ABNORMAL HIGH (ref 11.5–15.5)
WBC: 8.1 K/uL (ref 4.0–10.5)
nRBC: 0 % (ref 0.0–0.2)

## 2023-12-16 LAB — FERRITIN: Ferritin: 10 ng/mL — ABNORMAL LOW (ref 11–307)

## 2023-12-16 NOTE — Progress Notes (Signed)
 [facility]   [phone] [phone]   Clinic Follow up Note   Patient Care Team: Harrie Bruckner, DO as PCP - General Octavia Charleston, MD as Consulting Physician (Ophthalmology) Vernetta Berg, MD as Consulting Physician (General Surgery) Lanny Callander, MD as Consulting Physician (Hematology) Comer, [place] ORN, MD as Consulting Physician (Infectious Diseases) Legrand Victory LITTIE DOUGLAS, MD as Consulting Physician (Gastroenterology)  Date of Service:  12/16/2023  CHIEF COMPLAINT: f/u of iron deficient anemia  CURRENT THERAPY:  IV Feraheme  as needed  Oncology History   Iron deficiency anemia due to chronic blood loss secondary to GI blood loss, and Anemia of Chronic Disease -longstanding history of intermittent anemia dating back at least 2008 when Hg was 10. -h/o multiple GI bleeding in 2016, required blood transfusion, hospitalization and IV Feraheme  -EGD and colonoscopy on 10/29/17 and 03/17/18 with Dr. Legrand, which were normal. 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 -Due to recurrent anemia requiring IV Feraheme  and blood transfusions, she started bevacizumab  for GI bleeding on 08/10/20. She responded very well with resolved anemia and less need for iv iron. This has been held intermittently due to elevated urine protein. Last dose given 04/03/22. -She is clinically doing well, no melena or other GI bleeding lately.  Lab reviewed, hemoglobin 11.9, iron level still pending. -Will continue IV iron needed -She has cut down her marijuana and cocaine use, I again encouraged her to completely stop. Will hold beva if she is still actively using cocaine   Assessment & Plan Iron deficiency anemia Chronic iron deficiency anemia with current hemoglobin level at 8.6 g/dL, indicating mild anemia. Last IV iron infusion was in March, with post-infusion hemoglobin levels improving to 11 g/dL. Current anemia likely due to insufficient iron stores,  as no recent infusion has been administered since March. - Order IV iron infusion at [facility], [employer] she is contacted by infusion center for scheduling - Advise her to contact infusion center if not contacted by Thursday or Friday - Schedule follow-up lab appointment in four weeks to monitor hemoglobin levels - Plan for potential need for IV iron every two to three months  Cocaine use Ongoing cocaine use with participation in a management program. She reports feeling better but continues to work with the program.  Plan - Labs reviewed, hemoglobin 8.6, will schedule IV Feraheme  if ferritin less than 20 - Labs monthly, follow-up in 3 months   SUMMARY OF ONCOLOGIC HISTORY: Oncology History   No history exists.     Discussed the use of AI scribe software for clinical note transcription with the patient, who gave verbal consent to proceed.  History of Present Illness [name] is a 73 year old female with iron deficiency anemia who presents for follow-up.  Her hemoglobin level is currently 8.6 g/dL. Her last intravenous iron infusion was in March 2025, after which her hemoglobin improved to 11.4 g/dL. She is awaiting scheduling for her next infusion. She sometimes experiences fatigue.     All other systems were reviewed with the patient and are negative.  MEDICAL HISTORY:  Past Medical History:  Diagnosis Date   Allergic rhinitis    Anemia, iron deficiency 05/03/2011   2/2 GI AVMs - recieves iron infusions as well as periodic red blood cell transfusions   Anxiety    Arteriovenous malformation of duodenum 02/25/2015   CARPAL TUNNEL RELEASE, HX OF 07/22/2008   Chest pain 10/28/2019   Colon polyps  Diabetes mellitus without complication (HCC)    type 2   Difficulty sleeping    takes trazadone for sleep   Diverticulosis    Electrolyte abnormality 01/25/2021   Fall 01/03/2020   GERD (gastroesophageal reflux disease)    GI bleed 07/07/2020   Hepatitis C     in SVR with Harvoni  12/2014-02/2015.    History of hernia repair 08/18/2012   History of porphyria 11/30/2014   Porphyria cutanea tarda in setting of chronic Hepatitis C, frequent  IV iron infusions, and alcohol abuse    Hypertension    Hypocalcemia 10/28/2019   Hypocalcemia 01/19/2021   Hypokalemia 09/27/2014   Hypokalemia 01/19/2021   Hypomagnesemia 01/19/2021   Kidney disease 02/04/2019   Peripheral vascular disease (HCC)    Prolonged QT interval 01/19/2021   Stroke (HCC) 2010   no residual deficits   Substance use disorder    cocaine   Tachycardia 07/10/2017   Trichomonas vaginitis 04/16/2019    SURGICAL HISTORY: Past Surgical History:  Procedure Laterality Date   ABDOMINAL HYSTERECTOMY     APPLICATION OF WOUND VAC N/A 06/21/2014   Procedure: APPLICATION OF WOUND VAC;  Surgeon: Vicenta Poli, MD;  Location: [facility];  Service: General;  Laterality: N/A;   CARPAL TUNNEL RELEASE     rt hand   COLONOSCOPY WITH PROPOFOL  N/A 03/17/2018   Procedure: COLONOSCOPY WITH PROPOFOL ;  Surgeon: Legrand Victory LITTIE DOUGLAS, MD;  Location: [facility];  Service: Gastroenterology;  Laterality: N/A;   ENTEROSCOPY N/A 12/04/2012   Procedure: ENTEROSCOPY;  Surgeon: Belvie JONETTA Just, MD;  Location: [facility];  Service: Endoscopy;  Laterality: N/A;   ENTEROSCOPY N/A 12/18/2012   Procedure: ENTEROSCOPY;  Surgeon: Belvie JONETTA Just, MD;  Location: [facility];  Service: Endoscopy;  Laterality: N/A;   ENTEROSCOPY N/A 02/25/2015   Procedure: ENTEROSCOPY;  Surgeon: Lamar JONETTA Aho, MD;  Location: [facility];  Service: Endoscopy;  Laterality: N/A;   ENTEROSCOPY N/A 10/29/2017   Procedure: ENTEROSCOPY;  Surgeon: Legrand Victory LITTIE DOUGLAS, MD;  Location: [facility];  Service: Gastroenterology;  Laterality: N/A;   ENTEROSCOPY N/A 07/08/2020   Procedure: ENTEROSCOPY;  Surgeon: Teressa Toribio SQUIBB, MD;  Location: [facility];  Service: Endoscopy;  Laterality: N/A;   ENTEROSCOPY N/A 08/10/2023   Procedure: ENTEROSCOPY;  Surgeon: Suzann Inocente HERO, MD;  Location: [facility];  Service: Gastroenterology;  Laterality: N/A;   ESOPHAGOGASTRODUODENOSCOPY  05/05/2011   Procedure: ESOPHAGOGASTRODUODENOSCOPY (EGD);  Surgeon: Gordy Starch, MD;  Location: [facility];  Service: Gastroenterology;  Laterality: N/A;  Dr. Starch will do procedure for Dr. Just Saturday.   ESOPHAGOGASTRODUODENOSCOPY  05/08/2011   Procedure: ESOPHAGOGASTRODUODENOSCOPY (EGD);  Surgeon: Belvie JONETTA Just;  [facility]: [facility];  Service: Endoscopy;  Laterality: N/A;   ESOPHAGOGASTRODUODENOSCOPY  06/07/2011   Procedure: ESOPHAGOGASTRODUODENOSCOPY (EGD);  Surgeon: Belvie JONETTA Just, MD;  Location: [facility];  Service: Endoscopy;  Laterality: N/A;   ESOPHAGOGASTRODUODENOSCOPY  12/20/2011   Procedure: ESOPHAGOGASTRODUODENOSCOPY (EGD);  Surgeon: Belvie JONETTA Just, MD;  Location: [facility];  Service: Endoscopy;  Laterality: N/A;   EYE SURGERY Bilateral    cataracts   FLEXIBLE SIGMOIDOSCOPY  12/21/2011   Procedure: FLEXIBLE SIGMOIDOSCOPY;  Surgeon: Belvie JONETTA Just, MD;  Location: [facility];  Service: Endoscopy;  Laterality: N/A;   HARDWARE REMOVAL Right 08/08/2023   Procedure: HARDWARE REMOVAL RIGHT WRIST;  Surgeon: Murrell Drivers, MD;  Location: [facility];  Service: Orthopedics;  Laterality: Right;  60 MIN   HOT HEMOSTASIS  06/07/2011   Procedure: HOT HEMOSTASIS (ARGON PLASMA COAGULATION/BICAP);  Surgeon: Belvie JONETTA Just, MD;  Location: [facility];  Service: Endoscopy;  Laterality: N/A;   HOT HEMOSTASIS N/A 07/08/2020   Procedure: HOT HEMOSTASIS (ARGON PLASMA COAGULATION/BICAP);  Surgeon: Teressa Toribio SQUIBB, MD;  Location: [facility];  Service: Endoscopy;  Laterality: N/A;   HOT HEMOSTASIS N/A 08/10/2023   Procedure: EGD, WITH ARGON PLASMA COAGULATION;  Surgeon: Suzann Inocente HERO, MD;  Location: [facility];  Service: Gastroenterology;  Laterality: N/A;   INCISIONAL HERNIA REPAIR N/A 06/01/2014   Procedure: ATTEMPTED LAPAROSCOPIC AND OPEN INCISIONAL HERNIA REPAIR  WITH MESH;  Surgeon: Vicenta Poli, MD;  Location[facility];  Service: General;  Laterality: N/A;   INSERTION OF MESH N/A 06/01/2014   Procedure: INSERTION OF MESH;  Surgeon: Vicenta Poli, MD;  Location: [facility];  Service: General;  Laterality: N/A;   LAPAROTOMY  02/16/2012   Procedure: EXPLORATORY LAPAROTOMY;  Surgeon: Morene ONEIDA Olives, MD;  Location: [facility];  Service: General;  Laterality: N/A;  oversewing of anastomotic leak and rigid sigmoidoscopy   LAPAROTOMY N/A 06/21/2014   Procedure: ABDOMINAL WOUND EXPLORATION;  Surgeon: Vicenta Poli, MD;  Location: [facility];  Service: General;  Laterality: N/A;   LEG AMPUTATION ABOVE KNEE     left   PARTIAL COLECTOMY  02/15/2012   Procedure: PARTIAL COLECTOMY;  Surgeon: Vicenta DELENA Poli, MD;  Location: [facility];  Service: General;  Laterality: N/A;   SUBMUCOSAL INJECTION  07/08/2020   Procedure: SUBMUCOSAL INJECTION;  Surgeon: Teressa Toribio SQUIBB, MD;  Location: [facility];  Service: Endoscopy;;   TONSILLECTOMY     WRIST FUSION Right 04/27/2021   Procedure: SCAPHOID EXCISION  WITH 4 BONE FUSION RIGHT WRIST;  Surgeon: Murrell Drivers, MD;  Location: [facility];  Service: Orthopedics;  Laterality: Right;    I have reviewed the social history and family history with the patient and they are unchanged from previous note.  ALLERGIES:  is allergic to ciprofloxacin , morphine  and codeine, penicillins, codeine, lisinopril , and feraheme  [ferumoxytol ].  MEDICATIONS:  Current Outpatient Medications  Medication Sig Dispense Refill   Accu-Chek FastClix Lancets MISC Use to test blood glucose 3 times daily. 100 each 11   acetaminophen  (TYLENOL ) 500 MG tablet Take 1,000 mg by mouth every 6 (six) hours as needed for mild pain (pain score 1-3) or moderate pain (pain score 4-6).     allopurinol  (ZYLOPRIM ) 100 MG tablet Take 1/2 tablet (50 mg total) by mouth daily.  Begin medication after one week of colchicine  treatment. 30 tablet 2   amLODipine  (NORVASC )  10 MG tablet Take 1 tablet (10 mg total) by mouth daily. 90 tablet 1   Blood Glucose Monitoring Suppl (ACCU-CHEK GUIDE) w/Device KIT 1 each by Does not apply route 3 (three) times daily. The patient is insulin  requiring, ICD 10 code E11.65. The patient tests 3 times a day. 1 kit 1   calcium -vitamin D  (OSCAL WITH D) 500-200 MG-UNIT tablet Take 1 tablet by mouth 2 (two) times daily. 60 tablet 0   camphor-menthol  (SARNA) lotion Apply 1 Application topically as needed for itching. 222 mL 1   carvedilol  (COREG ) 6.25 MG tablet Take 1 tablet (6.25 mg total) by mouth 2 (two) times daily. (Patient not taking: Reported on 12/16/2023) 60 tablet 11   dapagliflozin  propanediol (FARXIGA ) 10 MG TABS tablet Take 1 tablet by mouth daily. (Patient taking differently: Take 10 mg by mouth in the morning.) 30 tablet 12   Dulaglutide  (TRULICITY ) 0.75 MG/0.5ML SOAJ Inject 0.75 mg into the skin once a week. (Patient taking differently:  Inject 0.75 mg into the skin once a week. On Sunday) 6 mL 3   famotidine  (PEPCID ) 20 MG tablet Take 1 tablet (20 mg total) by mouth 2 (two) times daily. (Patient taking differently: Take 20 mg by mouth as needed for heartburn or indigestion.) 60 tablet 1   gabapentin  (NEURONTIN ) 400 MG capsule Take 1 capsule (400 mg total) by mouth in the morning AND 1 capsule (400 mg total) daily in the afternoon AND 2 capsules (800 mg total) before bedtime. 360 capsule 3   glucose blood (ACCU-CHEK GUIDE) test strip Use as instructed 100 strip 3   losartan  (COZAAR ) 100 MG tablet Take 1 tablet (100 mg total) by mouth daily. (Patient taking differently: Take 100 mg by mouth in the morning.) 90 tablet 1   pantoprazole  (PROTONIX ) 40 MG tablet Take 1 tablet (40 mg total) by mouth daily. 42 tablet 1   rosuvastatin  (CRESTOR ) 10 MG tablet Take 1 tablet (10 mg total) by mouth in the morning. 90 tablet 11   Varenicline  Tartrate, Starter, (CHANTIX  STARTING MONTH PAK) 0.5 MG X 11 & 1 MG X 42 TBPK Starting 1 week prior to  quitting smoking, take by mouth as directed on the package. 53 each 0   No current facility-administered medications for this visit.    PHYSICAL EXAMINATION: ECOG PERFORMANCE STATUS: 1 - Symptomatic but completely ambulatory  Vitals:   12/16/23 0935  BP: 136/60  Pulse: 83  Resp: 16  Temp: 98.3 F (36.8 C)  SpO2: 99%   Wt Readings from Last 3 Encounters:  12/16/23 151 lb 3.2 oz (68.6 kg)  11/26/23 154 lb 3.2 oz (69.9 kg)  11/22/23 150 lb 4.8 oz (68.2 kg)     GENERAL:alert, no distress and comfortable SKIN: skin color, texture, turgor are normal, no rashes or significant lesions EYES: normal, Conjunctiva are pink and non-injected, sclera clear Musculoskeletal:no cyanosis of digits and no clubbing  NEURO: alert & oriented x 3 with fluent speech, no focal motor/sensory deficits  Physical Exam    LABORATORY DATA:  I have reviewed the data as listed    Latest Ref Rng & Units 12/16/2023    8:51 AM 11/26/2023    8:28 AM 09/19/2023   10:29 AM  CBC  WBC 4.0 - 10.5 K/uL 8.1  7.5  6.9   Hemoglobin 12.0 - 15.0 g/dL 8.6  8.8  88.5   Hematocrit 36.0 - 46.0 % 28.3  28.8  36.4   Platelets 150 - 400 K/uL 354  367  259         Latest Ref Rng & Units 10/14/2023   10:27 AM 09/19/2023   10:29 AM 08/10/2023    7:05 AM  CMP  Glucose 70 - 99 mg/dL 889  896  884   BUN 8 - 23 mg/dL 17  19  33   Creatinine 0.44 - 1.00 mg/dL 8.75  8.45  8.26   Sodium 135 - 145 mmol/L 140  139  138   Potassium 3.5 - 5.1 mmol/L 3.9  4.8  4.9   Chloride 98 - 111 mmol/L 105  108  110   CO2 22 - 32 mmol/L 27  28  19    Calcium  8.9 - 10.3 mg/dL 9.3  8.6  8.4   Total Protein 6.5 - 8.1 g/dL 7.8  6.8    Total Bilirubin 0.0 - 1.2 mg/dL 0.2  0.1    Alkaline Phos 38 - 126 U/L 87  94    AST 15 - 41 U/L  14  15    ALT 0 - 44 U/L 7  7        RADIOGRAPHIC STUDIES: I have personally reviewed the radiological images as listed and agreed with the findings in the report. No results found.    No orders of the  defined types were placed in this encounter.  All questions were answered. The patient knows to call the clinic with any problems, questions or concerns. No barriers to learning was detected. The total time spent in the appointment was 20 minutes, including review of chart and various tests results, discussions about plan of care and coordination of care plan     Onita Mattock, MD 12/16/2023

## 2023-12-16 NOTE — Addendum Note (Signed)
 Addended by: LANNY CALLANDER on: 12/16/2023 06:16 PM   Modules accepted: Orders

## 2023-12-17 ENCOUNTER — Other Ambulatory Visit: Payer: Self-pay

## 2023-12-17 ENCOUNTER — Inpatient Hospital Stay

## 2023-12-18 ENCOUNTER — Other Ambulatory Visit: Payer: Self-pay

## 2023-12-18 ENCOUNTER — Telehealth: Payer: Self-pay | Admitting: Pharmacy Technician

## 2023-12-18 NOTE — Telephone Encounter (Signed)
 Auth Submission: NO AUTH NEEDED Site of care: Site of care: [facility] Payer: [employer] DUAL Medication & CPT/J Code(s) submitted: Feraheme  (ferumoxytol ) [id number] Diagnosis Code:  Route of submission (phone, fax, portal):  Phone # Fax # Auth type: Buy/Bill PB Units/visits requested: 510MG  X2 DOSES Reference number: [id number] Approval from: 12/18/23 - 04/19/24

## 2023-12-19 ENCOUNTER — Telehealth: Payer: Self-pay | Admitting: Hematology

## 2023-12-19 ENCOUNTER — Ambulatory Visit (HOSPITAL_COMMUNITY): Admission: RE | Admit: 2023-12-19 | Source: Ambulatory Visit

## 2023-12-19 NOTE — Telephone Encounter (Signed)
 Scheduled appointments per 7/21 los. Talked with the patient and she is aware of all made appointments.

## 2023-12-25 ENCOUNTER — Ambulatory Visit (INDEPENDENT_AMBULATORY_CARE_PROVIDER_SITE_OTHER)

## 2023-12-25 VITALS — BP 131/78 | HR 76 | Temp 98.1°F | Resp 20 | Ht 61.0 in | Wt 147.4 lb

## 2023-12-25 DIAGNOSIS — K922 Gastrointestinal hemorrhage, unspecified: Secondary | ICD-10-CM | POA: Diagnosis not present

## 2023-12-25 DIAGNOSIS — D500 Iron deficiency anemia secondary to blood loss (chronic): Secondary | ICD-10-CM

## 2023-12-25 MED ORDER — SODIUM CHLORIDE 0.9 % IV SOLN
510.0000 mg | Freq: Once | INTRAVENOUS | Status: AC
  Administered 2023-12-25: 510 mg via INTRAVENOUS
  Filled 2023-12-25: qty 17

## 2023-12-25 MED ORDER — DIPHENHYDRAMINE HCL 25 MG PO CAPS
25.0000 mg | ORAL_CAPSULE | Freq: Once | ORAL | Status: AC
Start: 2023-12-25 — End: 2023-12-25
  Administered 2023-12-25: 25 mg via ORAL
  Filled 2023-12-25: qty 1

## 2023-12-25 MED ORDER — ACETAMINOPHEN 325 MG PO TABS
650.0000 mg | ORAL_TABLET | Freq: Once | ORAL | Status: AC
  Administered 2023-12-25: 650 mg via ORAL
  Filled 2023-12-25: qty 2

## 2023-12-25 NOTE — Progress Notes (Signed)
 Diagnosis: Iron Deficiency Anemia  Provider:  Praveen Mannam MD  Procedure: IV Infusion  IV Type: Peripheral, IV Location: L Antecubital  Feraheme  (Ferumoxytol ), Dose: 510 mg  Infusion Start Time: 0854  Infusion Stop Time: 0914  Post Infusion IV Care: Observation period completed  Discharge: Condition: Good, Destination: Home . AVS Provided  Performed by:  Eleanor DELENA Bloch, RN

## 2024-01-01 ENCOUNTER — Ambulatory Visit

## 2024-01-01 VITALS — BP 114/73 | HR 79 | Temp 97.9°F | Resp 16 | Ht 63.0 in | Wt 145.2 lb

## 2024-01-01 DIAGNOSIS — D500 Iron deficiency anemia secondary to blood loss (chronic): Secondary | ICD-10-CM | POA: Diagnosis not present

## 2024-01-01 DIAGNOSIS — K922 Gastrointestinal hemorrhage, unspecified: Secondary | ICD-10-CM | POA: Diagnosis not present

## 2024-01-01 MED ORDER — SODIUM CHLORIDE 0.9 % IV SOLN
510.0000 mg | Freq: Once | INTRAVENOUS | Status: AC
  Administered 2024-01-01: 510 mg via INTRAVENOUS
  Filled 2024-01-01: qty 17

## 2024-01-01 MED ORDER — DIPHENHYDRAMINE HCL 25 MG PO CAPS
25.0000 mg | ORAL_CAPSULE | Freq: Once | ORAL | Status: AC
  Administered 2024-01-01: 25 mg via ORAL
  Filled 2024-01-01: qty 1

## 2024-01-01 MED ORDER — ACETAMINOPHEN 325 MG PO TABS
650.0000 mg | ORAL_TABLET | Freq: Once | ORAL | Status: AC
  Administered 2024-01-01: 650 mg via ORAL
  Filled 2024-01-01: qty 2

## 2024-01-01 NOTE — Progress Notes (Signed)
 Diagnosis: Iron Deficiency Anemia  Provider:  Praveen Mannam MD  Procedure: IV Infusion  IV Type: Peripheral, IV Location: L Antecubital  Feraheme  (Ferumoxytol ), Dose: 510 mg  Infusion Start Time: 0928  Infusion Stop Time: 0945  Post Infusion IV Care: Observation period completed and Peripheral IV Discontinued  Discharge: Condition: Good, Destination: Home . AVS Declined  Performed by:  Maximiano JONELLE Pouch, LPN

## 2024-01-08 ENCOUNTER — Other Ambulatory Visit: Payer: Self-pay

## 2024-01-08 ENCOUNTER — Telehealth: Payer: Self-pay | Admitting: *Deleted

## 2024-01-08 ENCOUNTER — Other Ambulatory Visit (HOSPITAL_COMMUNITY): Payer: Self-pay

## 2024-01-08 MED ORDER — DAPAGLIFLOZIN PROPANEDIOL 10 MG PO TABS
10.0000 mg | ORAL_TABLET | Freq: Every day | ORAL | 11 refills | Status: DC
  Filled 2024-01-08 (×2): qty 30, 30d supply, fill #0
  Filled 2024-02-03: qty 30, 30d supply, fill #1
  Filled 2024-03-01: qty 30, 30d supply, fill #2
  Filled 2024-03-31: qty 30, 30d supply, fill #3
  Filled 2024-04-26: qty 30, 30d supply, fill #4
  Filled 2024-05-24: qty 30, 30d supply, fill #5

## 2024-01-08 NOTE — Telephone Encounter (Signed)
 Copied from CRM [id number]. Topic: General - Other >> Jan 08, 2024  2:34 PM Miquel SAILOR wrote: Reason for CRM: prosthetic Stocking /foot needs adjustment LEFT foot-Patient is calling on update for order. No order in sytem. Called office and transferred to [facility]

## 2024-01-08 NOTE — Telephone Encounter (Addendum)
 Call from pt requesting DME order be sent to [facility] for prosthetic supplies (stocking) and adjustment to foot. Has left AKA.  Pt states [facility] was to send request to [facility] over a mth ago.  CMA unable to locate order/request at this time. Pt's last visit 11/22/2023.   Call to [facility]-they will need order dme order and notes. Pt will need a visit. Appt scheduled for 08/20 0845 with [facility]. Will check with [facility] staff to see if visit can be a telehealth...Freddi Dess Cassady8/13/20253:08 PM

## 2024-01-13 ENCOUNTER — Ambulatory Visit (HOSPITAL_COMMUNITY)
Admission: RE | Admit: 2024-01-13 | Discharge: 2024-01-13 | Disposition: A | Discharge status: Home / Self Care | Source: Ambulatory Visit | Attending: Internal Medicine | Admitting: Internal Medicine

## 2024-01-13 DIAGNOSIS — E119 Type 2 diabetes mellitus without complications: Secondary | ICD-10-CM | POA: Diagnosis not present

## 2024-01-13 DIAGNOSIS — E785 Hyperlipidemia, unspecified: Secondary | ICD-10-CM | POA: Insufficient documentation

## 2024-01-13 DIAGNOSIS — R011 Cardiac murmur, unspecified: Secondary | ICD-10-CM | POA: Insufficient documentation

## 2024-01-13 DIAGNOSIS — I351 Nonrheumatic aortic (valve) insufficiency: Secondary | ICD-10-CM | POA: Diagnosis not present

## 2024-01-13 DIAGNOSIS — I10 Essential (primary) hypertension: Secondary | ICD-10-CM | POA: Diagnosis not present

## 2024-01-13 LAB — ECHOCARDIOGRAM COMPLETE
Calc EF: 56.5 %
S' Lateral: 2.7 cm
Single Plane A2C EF: 58.6 %
Single Plane A4C EF: 54.5 %

## 2024-01-14 ENCOUNTER — Ambulatory Visit: Payer: Self-pay | Admitting: Student

## 2024-01-14 ENCOUNTER — Inpatient Hospital Stay: Attending: Hematology

## 2024-01-14 ENCOUNTER — Telehealth: Payer: Self-pay | Admitting: *Deleted

## 2024-01-14 DIAGNOSIS — D509 Iron deficiency anemia, unspecified: Secondary | ICD-10-CM | POA: Insufficient documentation

## 2024-01-14 DIAGNOSIS — D500 Iron deficiency anemia secondary to blood loss (chronic): Secondary | ICD-10-CM

## 2024-01-14 LAB — CBC WITH DIFFERENTIAL/PLATELET
Abs Immature Granulocytes: 0.06 K/uL (ref 0.00–0.07)
Basophils Absolute: 0 K/uL (ref 0.0–0.1)
Basophils Relative: 1 %
Eosinophils Absolute: 0.1 K/uL (ref 0.0–0.5)
Eosinophils Relative: 2 %
HCT: 31.1 % — ABNORMAL LOW (ref 36.0–46.0)
Hemoglobin: 9.7 g/dL — ABNORMAL LOW (ref 12.0–15.0)
Immature Granulocytes: 1 %
Lymphocytes Relative: 14 %
Lymphs Abs: 0.8 K/uL (ref 0.7–4.0)
MCH: 26.9 pg (ref 26.0–34.0)
MCHC: 31.2 g/dL (ref 30.0–36.0)
MCV: 86.1 fL (ref 80.0–100.0)
Monocytes Absolute: 0.8 K/uL (ref 0.1–1.0)
Monocytes Relative: 13 %
Neutro Abs: 4.1 K/uL (ref 1.7–7.7)
Neutrophils Relative %: 69 %
Platelets: 265 K/uL (ref 150–400)
RBC: 3.61 MIL/uL — ABNORMAL LOW (ref 3.87–5.11)
RDW: 23.5 % — ABNORMAL HIGH (ref 11.5–15.5)
WBC: 5.9 K/uL (ref 4.0–10.5)
nRBC: 0 % (ref 0.0–0.2)

## 2024-01-14 LAB — FERRITIN: Ferritin: 167 ng/mL (ref 11–307)

## 2024-01-14 NOTE — Telephone Encounter (Signed)
 RTC to patient was asking if she can have her AWV done while she is here tomorrow for an appointment. Patient was afraid she will not be at home when the call comes in. Spoke with Donia, Pharmacist, community. It can be done at patient's visit on tomorrow.   Copied from CRM #[id number]. Topic: Clinical - Medical Advice >> Jan 14, 2024  8:44 AM Merlynn LABOR wrote: Reason for CRM: Patient is requesting call back from clinical staff. Please contact patient at [phone].

## 2024-01-15 ENCOUNTER — Other Ambulatory Visit (HOSPITAL_COMMUNITY): Payer: Self-pay

## 2024-01-15 ENCOUNTER — Ambulatory Visit

## 2024-01-15 ENCOUNTER — Ambulatory Visit (INDEPENDENT_AMBULATORY_CARE_PROVIDER_SITE_OTHER)

## 2024-01-15 ENCOUNTER — Encounter

## 2024-01-15 VITALS — BP 135/75 | HR 89 | Temp 97.8°F | Ht 61.0 in | Wt 152.0 lb

## 2024-01-15 DIAGNOSIS — Z89612 Acquired absence of left leg above knee: Secondary | ICD-10-CM | POA: Diagnosis not present

## 2024-01-15 DIAGNOSIS — S78112A Complete traumatic amputation at level between left hip and knee, initial encounter: Secondary | ICD-10-CM

## 2024-01-15 DIAGNOSIS — Z993 Dependence on wheelchair: Secondary | ICD-10-CM

## 2024-01-15 DIAGNOSIS — B352 Tinea manuum: Secondary | ICD-10-CM

## 2024-01-15 DIAGNOSIS — R21 Rash and other nonspecific skin eruption: Secondary | ICD-10-CM | POA: Diagnosis not present

## 2024-01-15 DIAGNOSIS — Z0000 Encounter for general adult medical examination without abnormal findings: Secondary | ICD-10-CM

## 2024-01-15 DIAGNOSIS — Z7689 Persons encountering health services in other specified circumstances: Secondary | ICD-10-CM

## 2024-01-15 MED ORDER — CLOTRIMAZOLE 1 % EX CREA
1.0000 | TOPICAL_CREAM | Freq: Two times a day (BID) | CUTANEOUS | 0 refills | Status: DC
  Filled 2024-01-15: qty 28, 14d supply, fill #0
  Filled 2024-01-17: qty 60, 30d supply, fill #0
  Filled 2024-01-17: qty 45, 23d supply, fill #0

## 2024-01-15 MED ORDER — CLOTRIMAZOLE 1 % EX CREA
1.0000 | TOPICAL_CREAM | Freq: Two times a day (BID) | CUTANEOUS | 0 refills | Status: DC
  Filled 2024-01-15: qty 56, 28d supply, fill #0

## 2024-01-15 NOTE — Patient Instructions (Signed)
 Thank you, [name], for allowing us  to provide your care today. Today we discussed . . .  > Rash on hands       - It is likely that this is a rash from a fungus.  I have sent you a cream that you can apply twice a day to the affected areas.  If it does not get better please call us  and we will look at it again in 2 weeks.  We have also gotten the labs to look for another cause of your rash. > Supplies       - I have sent in an order for years supplies for your prosthetic and your new wheelchair.   I have ordered the following labs for you:   Lab Orders         RPR       Referrals ordered today:    Referral Orders         Ambulatory Referral for DME       Follow up: PRN    Remember:  Should you have any questions or concerns please call the internal medicine clinic at [phone].     Melvenia Morrison, [facility]

## 2024-01-15 NOTE — Assessment & Plan Note (Signed)
 Patient presents today inquiring about a standard wheelchair for her at home.  She has had a left AKA about 18 years ago.  She normally gets around the community with a power wheelchair but her power wheelchair does not fit in her home cannot go through the doorways and always of her home.  She has been using a standard wheelchair at home but her wheelchair is over 71 years old now and breaking apart.  She is unable to safely use his wheelchair in her home due to fear that the wheelchair will give out.  It is also leaving undesirable marks on her floors and causing her irritation because the arm pads are also too old.  She requires this wheelchair due to her left above-knee amputation and difficulty performing her morning activities of daily living such as toileting grooming and dressing.  Another mobility device would not resolve this issue.  A wheelchair will allow her to safely perform her activities of daily living.  She can safely propel the wheelchair in her home due to her normal upper extremity strength.  - Ordered standard wheelchair for the patient's home -Continue using the power wheelchair in the community.

## 2024-01-15 NOTE — Patient Instructions (Signed)

## 2024-01-15 NOTE — Progress Notes (Unsigned)
 CC: Mobility examination  HPI:  [name] is a 73 y.o. female with pertinent PMH of left leg AKA, chronic kidney disease, hypertension, iron deficiency anemia receiving infusions who presents for Mobility examination. Please see problem based assessment and plan for further history.  Review of Systems  Constitutional:  Negative for chills, fever and weight loss.  Respiratory:  Negative for cough and shortness of breath.   Cardiovascular:  Negative for chest pain.  Gastrointestinal:  Negative for abdominal pain, heartburn, nausea and vomiting.  Genitourinary:  Negative for dysuria, hematuria and urgency.    Medications: Current Outpatient Medications  Medication Instructions   Accu-Chek FastClix Lancets MISC Use to test blood glucose 3 times daily.   acetaminophen  (TYLENOL ) 1,000 mg, Every 6 hours PRN   allopurinol  (ZYLOPRIM ) 100 MG tablet Take 1/2 tablet (50 mg total) by mouth daily.  Begin medication after one week of colchicine  treatment.   amLODipine  (NORVASC ) 10 mg, Oral, Daily   Blood Glucose Monitoring Suppl (ACCU-CHEK GUIDE) w/Device KIT 1 each, Does not apply, 3 times daily, The patient is insulin  requiring, ICD 10 code E11.65. The patient tests 3 times a day.   calcium -vitamin D  (OSCAL WITH D) 500-200 MG-UNIT tablet 1 tablet, Oral, 2 times daily   camphor-menthol  (SARNA) lotion 1 Application, Topical, As needed   carvedilol  (COREG ) 6.25 mg, Oral, 2 times daily   dapagliflozin  propanediol (FARXIGA ) 10 MG TABS tablet Take 1 tablet by mouth daily.   famotidine  (PEPCID ) 20 mg, Oral, 2 times daily   gabapentin  (NEURONTIN ) 400 MG capsule Take 1 capsule (400 mg total) by mouth in the morning AND 1 capsule (400 mg total) daily in the afternoon AND 2 capsules (800 mg total) before bedtime.   glucose blood (ACCU-CHEK GUIDE) test strip Use as instructed   losartan  (COZAAR ) 100 mg, Oral, Daily   pantoprazole  (PROTONIX ) 40 mg, Oral, Daily   rosuvastatin  (CRESTOR ) 10 mg, Oral,  Every morning   Trulicity  0.75 mg, Subcutaneous, Weekly   Varenicline  Tartrate, Starter, (CHANTIX  STARTING MONTH PAK) 0.5 MG X 11 & 1 MG X 42 TBPK Starting 1 week prior to quitting smoking, take by mouth as directed on the package.     Physical Exam:  Vitals:   01/15/24 0829  BP: 135/75  Pulse: 89  Temp: 97.8 F (36.6 C)  TempSrc: Oral  SpO2: 99%  Weight: 152 lb (68.9 kg)  Height: 5' 1 (1.549 m)    Physical Exam Constitutional:      General: She is not in acute distress.    Appearance: She is not ill-appearing.  Cardiovascular:     Rate and Rhythm: Normal rate and regular rhythm.     Heart sounds: Murmur heard.  Pulmonary:     Effort: No respiratory distress.  Musculoskeletal:     Right lower leg: No edema.     Left lower leg: No edema.     Comments: Left lower extremity prosthesis.   Left-sided AKA 3/5 strength with left hip flexion  5/5 strength right lower extremity  5/5 strength bilateral upper extremities.  Using power wheelchair   Skin:    Findings: Erythema and rash present.     Comments: Multiple raised scaly lesions over the palms and sole with flakiness.  Lesions are 1 to 2 cm wide.  Tender to palpation.  Neurological:     Mental Status: She is alert.      Media Information   Document Information  Photos  Hand rash  01/15/2024 10:12  Attached  To:  Office Visit on 01/15/24 with Napoleon Limes, MD  Source Information  Napoleon Limes, MD  [facility]     Media Information   Document Information  Photos  Foot rash  01/15/2024 10:14  Attached To:  Office Visit on 01/15/24 with Napoleon Limes, MD  Source Information  Napoleon Limes, MD  [facility]    Assessment & Plan:   Assessment & Plan Encounter for wheelchair assessment Patient presents today inquiring about a standard wheelchair for her at home.  She has had a left AKA about 18 years ago.  She normally gets around the community with a power  wheelchair but her power wheelchair does not fit in her home cannot go through the doorways and always of her home.  She has been using a standard wheelchair at home but her wheelchair is over 57 years old now and breaking apart.  She is unable to safely use his wheelchair in her home due to fear that the wheelchair will give out.  It is also leaving undesirable marks on her floors and causing her irritation because the arm pads are also too old.  She requires this wheelchair due to her left above-knee amputation and difficulty performing her morning activities of daily living such as toileting grooming and dressing.  Another mobility device would not resolve this issue.  A wheelchair will allow her to safely perform her activities of daily living.  She can safely propel the wheelchair in her home due to her normal upper extremity strength.  - Ordered standard wheelchair for the patient's home -Continue using the power wheelchair in the community. Above knee amputation of left lower extremity [facility]) Patient also requesting new prosthetic supplies.  She states that some of the bolts on her prosthesis are starting to come loose.  She requires a prosthetic sleeve and prosthetic stockings as well.  - Referral for prosthetic DME placed to [facility] She has a new rash on her bilateral hands and right foot that started about 2 weeks ago.  She had been using a new soap but started using this 4 weeks prior to the rash.  She has not started any new drugs.  She has not been picking at her skin.  The rash is somewhat painful and mildly itchy.  The rash is raised, with some scaling evident over it.  Seems like the rash is fungal in nature. Pictures in chart.  Due to distribution on the palms and soles we will also get RPR to rule out secondary syphilis.  - Start clotrimazole  cream twice daily. - RPR to rule out syphilis  No orders of the defined types were placed in this encounter.    Patient seen with  Dr. Ronnald Karna Limes Napoleon, MD [facility] Internal [facility] Resident PGY-1 Clinic Phone: [phone] Please contact the on call pager at [phone] for any urgent or emergent needs.

## 2024-01-15 NOTE — Progress Notes (Signed)
 Subjective:   [name] is a 73 y.o. female who presents for Medicare Annual (Subsequent) preventive examination.  Visit Complete: In person  Defer to PCP.       Objective:    Today's Vitals   01/15/24 1138 01/15/24 1141  BP: 135/75   Pulse: 89   Temp: 97.8 F (36.6 C)   TempSrc: Oral   SpO2: 99%   Weight: 152 lb (68.9 kg)   Height: 5' 1 (1.549 m)   PainSc:  0-No pain   Body mass index is 28.72 kg/m.     01/15/2024    8:36 AM 11/22/2023    8:50 AM 08/09/2023    2:32 PM 08/09/2023   11:59 AM 07/30/2023    8:11 AM 03/13/2023    8:55 AM 01/16/2023    8:58 AM  Advanced Directives  Does Patient Have a Medical Advance Directive? No No No No No No No  Would patient like information on creating a medical advance directive? No - Patient declined No - Patient declined No - Patient declined  No - Patient declined No - Patient declined No - Patient declined    Current Medications (verified) Outpatient Encounter Medications as of 01/15/2024  Medication Sig   Accu-Chek FastClix Lancets MISC Use to test blood glucose 3 times daily.   acetaminophen  (TYLENOL ) 500 MG tablet Take 1,000 mg by mouth every 6 (six) hours as needed for mild pain (pain score 1-3) or moderate pain (pain score 4-6).   allopurinol  (ZYLOPRIM ) 100 MG tablet Take 1/2 tablet (50 mg total) by mouth daily.  Begin medication after one week of colchicine  treatment.   amLODipine  (NORVASC ) 10 MG tablet Take 1 tablet (10 mg total) by mouth daily.   Blood Glucose Monitoring Suppl (ACCU-CHEK GUIDE) w/Device KIT 1 each by Does not apply route 3 (three) times daily. The patient is insulin  requiring, ICD 10 code E11.65. The patient tests 3 times a day.   calcium -vitamin D  (OSCAL WITH D) 500-200 MG-UNIT tablet Take 1 tablet by mouth 2 (two) times daily.   camphor-menthol  (SARNA) lotion Apply 1 Application topically as needed for itching.   carvedilol  (COREG ) 6.25 MG tablet Take 1 tablet (6.25 mg total) by mouth 2 (two) times  daily. (Patient not taking: Reported on 12/16/2023)   dapagliflozin  propanediol (FARXIGA ) 10 MG TABS tablet Take 1 tablet by mouth daily.   Dulaglutide  (TRULICITY ) 0.75 MG/0.5ML SOAJ Inject 0.75 mg into the skin once a week. (Patient taking differently: Inject 0.75 mg into the skin once a week. On Sunday)   famotidine  (PEPCID ) 20 MG tablet Take 1 tablet (20 mg total) by mouth 2 (two) times daily. (Patient taking differently: Take 20 mg by mouth as needed for heartburn or indigestion.)   gabapentin  (NEURONTIN ) 400 MG capsule Take 1 capsule (400 mg total) by mouth in the morning AND 1 capsule (400 mg total) daily in the afternoon AND 2 capsules (800 mg total) before bedtime.   glucose blood (ACCU-CHEK GUIDE) test strip Use as instructed   losartan  (COZAAR ) 100 MG tablet Take 1 tablet (100 mg total) by mouth daily. (Patient taking differently: Take 100 mg by mouth in the morning.)   pantoprazole  (PROTONIX ) 40 MG tablet Take 1 tablet (40 mg total) by mouth daily.   rosuvastatin  (CRESTOR ) 10 MG tablet Take 1 tablet (10 mg total) by mouth in the morning.   Varenicline  Tartrate, Starter, (CHANTIX  STARTING MONTH PAK) 0.5 MG X 11 & 1 MG X 42 TBPK Starting 1 week prior  to quitting smoking, take by mouth as directed on the package.   No facility-administered encounter medications on file as of 01/15/2024.    Allergies (verified) Ciprofloxacin , Morphine  and codeine, Penicillins, Codeine, Lisinopril , and Feraheme  [ferumoxytol ]   History: Past Medical History:  Diagnosis Date   Allergic rhinitis    Anemia, iron deficiency 05/03/2011   2/2 GI AVMs - recieves iron infusions as well as periodic red blood cell transfusions   Anxiety    Arteriovenous malformation of duodenum 02/25/2015   CARPAL TUNNEL RELEASE, HX OF 07/22/2008   Chest pain 10/28/2019   Colon polyps    Diabetes mellitus without complication (HCC)    type 2   Difficulty sleeping    takes trazadone for sleep   Diverticulosis    Electrolyte  abnormality 01/25/2021   Fall 01/03/2020   GERD (gastroesophageal reflux disease)    GI bleed 07/07/2020   Hepatitis C    in SVR with Harvoni  12/2014-02/2015.    History of hernia repair 08/18/2012   History of porphyria 11/30/2014   Porphyria cutanea tarda in setting of chronic Hepatitis C, frequent  IV iron infusions, and alcohol abuse    Hypertension    Hypocalcemia 10/28/2019   Hypocalcemia 01/19/2021   Hypokalemia 09/27/2014   Hypokalemia 01/19/2021   Hypomagnesemia 01/19/2021   Kidney disease 02/04/2019   Peripheral vascular disease (HCC)    Prolonged QT interval 01/19/2021   Stroke (HCC) 2010   no residual deficits   Substance use disorder    cocaine   Tachycardia 07/10/2017   Trichomonas vaginitis 04/16/2019   Past Surgical History:  Procedure Laterality Date   ABDOMINAL HYSTERECTOMY     APPLICATION OF WOUND VAC N/A 06/21/2014   Procedure: APPLICATION OF WOUND VAC;  Surgeon: Vicenta Poli, MD;  Location: [facility];  Service: General;  Laterality: N/A;   CARPAL TUNNEL RELEASE     rt hand   COLONOSCOPY WITH PROPOFOL  N/A 03/17/2018   Procedure: COLONOSCOPY WITH PROPOFOL ;  Surgeon: Legrand Victory LITTIE DOUGLAS, MD;  Location: [facility];  Service: Gastroenterology;  Laterality: N/A;   ENTEROSCOPY N/A 12/04/2012   Procedure: ENTEROSCOPY;  Surgeon: Belvie JONETTA Just, MD;  Location: [facility];  Service: Endoscopy;  Laterality: N/A;   ENTEROSCOPY N/A 12/18/2012   Procedure: ENTEROSCOPY;  Surgeon: Belvie JONETTA Just, MD;  [facility];  Service: Endoscopy;  Laterality: N/A;   ENTEROSCOPY N/A 02/25/2015   Procedure: ENTEROSCOPY;  Surgeon: Lamar JONETTA Aho, MD;  Location: [facility];  Service: Endoscopy;  Laterality: N/A;   ENTEROSCOPY N/A 10/29/2017   Procedure: ENTEROSCOPY;  Surgeon: Legrand Victory LITTIE DOUGLAS, MD;  [facility];  Service: Gastroenterology;  Laterality: N/A;   ENTEROSCOPY N/A 07/08/2020   Procedure: ENTEROSCOPY;  Surgeon: Teressa Toribio SQUIBB, MD;  [facility];  Service:  Endoscopy;  Laterality: N/A;   ENTEROSCOPY N/A 08/10/2023   Procedure: ENTEROSCOPY;  Surgeon: Suzann Inocente HERO, MD;  Location: [facility] END[facility]COPY;  Service: Gastroenterology;  Laterality: N/A;   ESOPHAGOGASTRODUODENOSCOPY  05/05/2011   Procedure: ESOPHAGOGASTRODUODENOSCOPY (EGD);  Surgeon: Gordy Starch, MD;  Location: [facility];  Service: Gastroenterology;  Laterality: N/A;  Dr. Starch will do procedure for Dr. Just Saturday.   ESOPHAGOGASTRODUODENOSCOPY  05/08/2011   Procedure: ESOPHAGOGASTRODUODENOSCOPY (EGD);  Surgeon: Belvie JONETTA Just;  [facility];  Service: Endoscopy;  Laterality: N/A;   ESOPHAGOGASTRODUODENOSCOPY  06/07/2011   Procedure: ESOPHAGOGASTRODUODENOSCOPY (EGD);  Surgeon: Belvie JONETTA Just, MD;  Location: [facility];  Service: Endoscopy;  Laterality: N/A;   ESOPHAGOGASTRODUODENOSCOPY  12/20/2011   Procedure:  ESOPHAGOGASTRODUODENOSCOPY (EGD);  Surgeon: Belvie JONETTA Just, MD;  Location: [facility];  Service: Endoscopy;  Laterality: N/A;   EYE SURGERY Bilateral    cataracts   FLEXIBLE SIGMOIDOSCOPY  12/21/2011   Procedure: FLEXIBLE SIGMOIDOSCOPY;  Surgeon: Belvie JONETTA Just, MD;  [facility];  Service: Endoscopy;  Laterality: N/A;   HARDWARE REMOVAL Right 08/08/2023   Procedure: HARDWARE REMOVAL RIGHT WRIST;  Surgeon: Murrell Drivers, MD;  Location: [facility];  Service: Orthopedics;  Laterality: Right;  60 MIN   HOT HEMOSTASIS  06/07/2011   Procedure: HOT HEMOSTASIS (ARGON PLASMA COAGULATION/BICAP);  Surgeon: Belvie JONETTA Just, MD;  Location: [facility];  Service: Endoscopy;  Laterality: N/A;   HOT HEMOSTASIS N/A 07/08/2020   Procedure: HOT HEMOSTASIS (ARGON PLASMA COAGULATION/BICAP);  Surgeon: Teressa Toribio SQUIBB, MD;  Location: [facility];  Service: Endoscopy;  Laterality: N/A;   HOT HEMOSTASIS N/A 08/10/2023   Procedure: EGD, WITH ARGON PLASMA COAGULATION;  Surgeon: Suzann Inocente HERO, MD;  Location: [facility] ENDOSCOPY;  Service: Gastroenterology;  Laterality: N/A;    INCISIONAL HERNIA REPAIR N/A 06/01/2014   Procedure: ATTEMPTED LAPAROSCOPIC AND OPEN INCISIONAL HERNIA REPAIR WITH MESH;  Surgeon: Vicenta Poli, MD;  [facility] OR;  Service: General;  Laterality: N/A;   INSERTION OF MESH N/A 06/01/2014   Procedure: INSERTION OF MESH;  Surgeon: Vicenta Poli, MD;  [facility];  Service: General;  Laterality: N/A;   LAPAROTOMY  02/16/2012   Procedure: EXPLORATORY LAPAROTOMY;  Surgeon: Morene ONEIDA Olives, MD;  [facility]: [facility];  Service: General;  Laterality: N/A;  oversewing of anastomotic leak and rigid sigmoidoscopy   LAPAROTOMY N/A 06/21/2014   Procedure: ABDOMINAL WOUND EXPLORATION;  Surgeon: Vicenta Poli, MD;  Location: [facility];  Service: General;  Laterality: N/A;   LEG AMPUTATION ABOVE KNEE     left   PARTIAL COLECTOMY  02/15/2012   Procedure: PARTIAL COLECTOMY;  Surgeon: Vicenta DELENA Poli, MD;  [facility];  Service: General;  Laterality: N/A;   SUBMUCOSAL INJECTION  07/08/2020   Procedure: SUBMUCOSAL INJECTION;  Surgeon: Teressa Toribio SQUIBB, MD;  [facility];  Service: Endoscopy;;   TONSILLECTOMY     WRIST FUSION Right 04/27/2021   Procedure: SCAPHOID EXCISION  WITH 4 BONE FUSION RIGHT WRIST;  Surgeon: Murrell Drivers, MD;  Location: [facility];  Service: Orthopedics;  Laterality: Right;   Family History  Problem Relation Age of Onset   Cancer Father        unknown   Hypertension Mother    Diverticulitis Mother    Social History   Socioeconomic History   Marital status: Widowed    Spouse name: Not on file   Number of children: 3   Years of education: Not on file   Highest education level: Not on file  Occupational History   Occupation: Retired  Tobacco Use   Smoking status: Some Days    Current packs/day: 0.50    Average packs/day: 0.5 packs/day for 17.0 years (8.5 ttl pk-yrs)    Types: Cigarettes   Smokeless tobacco: Never   Tobacco comments:    1 pack last 1 month  Vaping Use   Vaping status:  Never Used  Substance and Sexual Activity   Alcohol use: Yes    Comment: few drinks a week   Drug use: Yes    Types: Cocaine, Marijuana    Comment: last used about 2 weeks cocaine and marijuana   Sexual activity: Yes    Partners: Male    Birth control/protection:  Post-menopausal  Other Topics Concern   Not on file  Social History Narrative   Not on file   Social Drivers of Health   Financial Resource Strain: Low Risk  (01/15/2024)   Overall Financial Resource Strain (CARDIA)    Difficulty of Paying Living Expenses: Not hard at all  Food Insecurity: No Food Insecurity (01/15/2024)   Hunger Vital Sign    Worried About Running Out of Food in the Last Year: Never true    Ran Out of Food in the Last Year: Never true  Transportation Needs: No Transportation Needs (01/15/2024)   PRAPARE - Administrator, Civil Service (Medical): No    Lack of Transportation (Non-Medical): No  Physical Activity: Sufficiently Active (01/15/2024)   Exercise Vital Sign    Days of Exercise per Week: 7 days    Minutes of Exercise per Session: 30 min  Stress: No Stress Concern Present (01/15/2024)   [employer] of Occupational Health - Occupational Stress Questionnaire    Feeling of Stress: Not at all  Social Connections: Moderately Integrated (01/15/2024)   Social Connection and Isolation Panel    Frequency of Communication with Friends and Family: More than three times a week    Frequency of Social Gatherings with Friends and Family: More than three times a week    Attends Religious Services: More than 4 times per year    Active Member of [employer] or Organizations: Yes    Attends Banker Meetings: More than 4 times per year    Marital Status: Widowed    Tobacco Counseling Ready to quit: Not Answered Counseling given: Not Answered Tobacco comments: 1 pack last 1 month   Clinical Intake:  Pre-visit preparation completed: Yes  Pain : No/denies pain Pain Score: 0-No  pain     BMI - recorded: 28.72 Nutritional Status: BMI 25 -29 Overweight Nutritional Risks: None Diabetes: Yes CBG done?: No Did pt. bring in CBG monitor from home?: No  How often do you need to have someone help you when you read instructions, pamphlets, or other written materials from your doctor or pharmacy?: 1 - Never  Interpreter Needed?: No      Activities of Daily Living    01/15/2024    8:34 AM 08/09/2023    2:32 PM  In your present state of health, do you have any difficulty performing the following activities:  Hearing? 0 0  Vision? 0 1  Difficulty concentrating or making decisions? 1 0  Comment sometime   Walking or climbing stairs? 1   Dressing or bathing? 1   Doing errands, shopping? 1 1    Patient Care Team: Harrie Bruckner, DO as PCP - General Octavia Charleston, MD as Consulting Physician (Ophthalmology) Vernetta Berg, MD as Consulting Physician (General Surgery) Lanny Callander, MD as Consulting Physician (Hematology) Comer, Charleston ORN, MD as Consulting Physician (Infectious Diseases) Danis, Victory LITTIE MOULD, MD as Consulting Physician (Gastroenterology)  Indicate any recent Medical Services you may have received from other than [facility] providers in the past year (date may be approximate).     Assessment:   This is a routine wellness examination for [facility].  Hearing/Vision screen No results found.   Goals Addressed   None    Depression Screen    01/15/2024    9:16 AM 01/15/2024    8:35 AM 12/16/2023    9:00 AM 11/26/2023    9:24 AM 11/22/2023    8:49 AM 08/21/2023    9:17 AM 08/21/2023  8:58 AM  PHQ 2/9 Scores  PHQ - 2 Score 1 1 0 0 0 2 0  PHQ- 9 Score      9     Fall Risk    01/15/2024    8:35 AM 11/22/2023    8:49 AM 07/30/2023    8:11 AM 03/13/2023    8:55 AM 01/16/2023    8:58 AM  Fall Risk   Falls in the past year? 0 0 1 0 0  Number falls in past yr: 0 0 0 0 0  Injury with Fall? 0 0 1 0 0  Risk for fall due to : No Fall Risks Impaired  balance/gait Impaired balance/gait Impaired mobility No Fall Risks  Follow up Falls evaluation completed Falls evaluation completed Falls evaluation completed Falls evaluation completed;Falls prevention discussed Falls evaluation completed;Falls prevention discussed    MEDICARE RISK AT HOME:    TIMED UP AND GO:  Was the test performed?  No    Cognitive Function:        01/15/2024    9:19 AM 09/03/2022    2:18 PM  6CIT Screen  What Year? 0 points 0 points  What month? 0 points 0 points  What time? 0 points 0 points  Count back from 20 0 points 0 points  Months in reverse 0 points 0 points  Repeat phrase 0 points 0 points  Total Score 0 points 0 points    Immunizations Immunization History  Administered Date(s) Administered   Fluad Quad(high Dose 65+) 02/25/2020   Fluad Trivalent(High Dose 65+) 03/13/2023   Hepatitis A, Adult 01/28/2015, 09/20/2015   Hepatitis B, ADULT 03/15/2015, 09/20/2015   Hepatitis B, PED/ADOLESCENT 01/28/2015   Influenza Split 02/17/2012   Influenza,inj,Quad PF,6+ Mos 02/11/2013, 03/19/2014, 02/22/2015, 03/08/2016, 03/13/2017, 02/26/2018, 02/04/2019, 03/30/2021, 03/07/2022   PFIZER(Purple Top)SARS-COV-2 Vaccination 08/08/2019, 09/01/2019, 03/03/2020   PNEUMOCOCCAL CONJUGATE-20 03/07/2022   Pneumococcal Conjugate-13 02/04/2019   Pneumococcal Polysaccharide-23 05/05/2011, 09/04/2016   Tdap 04/01/2015    TDAP status: Up to date  Flu Vaccine status: Up to date  Pneumococcal vaccine status: Up to date  Covid-19 vaccine status: Information provided on how to obtain vaccines.   Qualifies for Shingles Vaccine? Yes   Zostavax completed No   Shingrix Completed?: No.    Education has been provided regarding the importance of this vaccine. Patient has been advised to call insurance company to determine out of pocket expense if they have not yet received this vaccine. Advised may also receive vaccine at local pharmacy or Health Dept. Verbalized acceptance  and understanding.  Screening Tests Health Maintenance  Topic Date Due   Zoster Vaccines- Shingrix (1 of 2) Never done   DEXA SCAN  Never done   Diabetic kidney evaluation - Urine ACR  03/30/2022   COVID-19 Vaccine (4 - 2024-25 season) 01/27/2023   INFLUENZA VACCINE  12/27/2023   HEMOGLOBIN A1C  05/23/2024   LIPID PANEL  06/12/2024   OPHTHALMOLOGY EXAM  07/30/2024   Diabetic kidney evaluation - eGFR measurement  10/13/2024   FOOT EXAM  11/21/2024   Medicare Annual Wellness (AWV)  01/14/2025   DTaP/Tdap/Td (2 - Td or Tdap) 03/31/2025   MAMMOGRAM  05/02/2025   Colonoscopy  03/17/2028   Pneumococcal Vaccine: 50+ Years  Completed   Hepatitis C Screening  Completed   HPV VACCINES  Aged Out   Meningococcal B Vaccine  Aged Out   Hepatitis B Vaccines 19-59 Average Risk  Discontinued    Health Maintenance  Health Maintenance Due  Topic Date  Due   Zoster Vaccines- Shingrix (1 of 2) Never done   DEXA SCAN  Never done   Diabetic kidney evaluation - Urine ACR  03/30/2022   COVID-19 Vaccine (4 - 2024-25 season) 01/27/2023   INFLUENZA VACCINE  12/27/2023    Colorectal cancer screening: Type of screening: Colonoscopy. Completed 03/17/2018. Repeat every 10 years  Mammogram status: Completed 05/03/2023. Repeat every year  Bone Density status: Ordered 06/13/2023. Pt provided with contact info and advised to call to schedule appt.  Lung Cancer Screening: (Low Dose CT Chest recommended if Age 54-80 years, 20 pack-year currently smoking OR have quit w/in 15years.) does not qualify.     Additional Screening:  Hepatitis C Screening: does qualify; Completed 09/27/2022  Vision Screening: Recommended annual ophthalmology exams for early detection of glaucoma and other disorders of the eye. Is the patient up to date with their annual eye exam?  Yes  Who is the provider or what is the name of the office in which the patient attends annual eye exams? [facility], civil (consulting).  If pt is not established with  a provider, would they like to be referred to a provider to establish care? No .   Dental Screening: Recommended annual dental exams for proper oral hygiene  Diabetic Foot Exam: Diabetic Foot Exam: Completed 11/22/2023  Community Resource Referral / Chronic Care Management: CRR required this visit?  No   CCM required this visit?  No     Plan:     I have personally reviewed and noted the following in the patient's chart:   Medical and social history Use of alcohol, tobacco or illicit drugs  Current medications and supplements including opioid prescriptions. Patient is not currently taking opioid prescriptions. Functional ability and status Nutritional status Physical activity Advanced directives List of other physicians Hospitalizations, surgeries, and ER visits in previous 12 months Vitals Screenings to include cognitive, depression, and falls Referrals and appointments  In addition, I have reviewed and discussed with patient certain preventive protocols, quality metrics, and best practice recommendations. A written personalized care plan for preventive services as well as general preventive health recommendations were provided to patient.     Timber Lucarelli, CMA   01/15/2024   After Visit Summary: (In Person-Printed) AVS printed and given to the patient  Nurse Notes: Layvonne Overlie, CMA 01/15/2024   Ms. Lang , Thank you for taking time to come for your Medicare Wellness Visit. I appreciate your ongoing commitment to your health goals. Please review the following plan we discussed and let me know if I can assist you in the future.   These are the goals we discussed:  Goals      Blood Pressure < 140/90     HEMOGLOBIN A1C < 6.5     Prevent Falls        This is a list of the screening recommended for you and due dates:  Health Maintenance  Topic Date Due   Zoster (Shingles) Vaccine (1 of 2) Never done   DEXA scan (bone density measurement)  Never done   Yearly kidney  health urinalysis for diabetes  03/30/2022   COVID-19 Vaccine (4 - 2024-25 season) 01/27/2023   Flu Shot  12/27/2023   Hemoglobin A1C  05/23/2024   Lipid (cholesterol) test  06/12/2024   Eye exam for diabetics  07/30/2024   Yearly kidney function blood test for diabetes  10/13/2024   Complete foot exam   11/21/2024   Medicare Annual Wellness Visit  01/14/2025   DTaP/Tdap/Td vaccine (  2 - Td or Tdap) 03/31/2025   Mammogram  05/02/2025   Colon Cancer Screening  03/17/2028   Pneumococcal Vaccine for age over 75  Completed   Hepatitis C Screening  Completed   HPV Vaccine  Aged Out   Meningitis B Vaccine  Aged Out   Hepatitis B Vaccine  Discontinued

## 2024-01-15 NOTE — Assessment & Plan Note (Signed)
 Patient also requesting new prosthetic supplies.  She states that some of the bolts on her prosthesis are starting to come loose.  She requires a prosthetic sleeve and prosthetic stockings as well.  - Referral for prosthetic DME placed to [facility]

## 2024-01-16 ENCOUNTER — Ambulatory Visit: Payer: Self-pay

## 2024-01-16 ENCOUNTER — Other Ambulatory Visit (HOSPITAL_COMMUNITY): Payer: Self-pay

## 2024-01-16 LAB — RPR: RPR Ser Ql: NONREACTIVE

## 2024-01-16 NOTE — Telephone Encounter (Signed)
 Copied from CRM #[id number]. Topic: MyChart - Other >> Jan 15, 2024  3:57 PM Susanna ORN wrote: Reason for CRM: Patient called back to speak with Theoplis. Called CAL & Charsetta stated that Theoplis was in clinic this afternoon & to put in a CRM for her to call patient back tomorrow. Please give patient a call back. She states she needs her pass code for MyChart. CB #: [id number]

## 2024-01-16 NOTE — Progress Notes (Signed)
 Internal Medicine Clinic Attending  I was physically present during the key portions of the resident provided service and participated in the medical decision making of patient's management care. I reviewed pertinent patient test results.  The assessment, diagnosis, and plan were formulated together and I agree with the documentation in the resident's note. Hyperpigmented, pruritic and uncomfortable papules/plaques with scale over bilateral hands (L > R) and left foot. No systemic symptoms. RPR non-reactive. Seems most consistent with fungal infection and will treat with topical azole therapy for 2 weeks. If no improvement, we discussed returning to clinic for re-evaluation and consideration of alternative diagnoses.  Karna Fellows, MD

## 2024-01-17 ENCOUNTER — Other Ambulatory Visit (HOSPITAL_COMMUNITY): Payer: Self-pay

## 2024-01-18 ENCOUNTER — Other Ambulatory Visit (HOSPITAL_COMMUNITY): Payer: Self-pay

## 2024-01-20 ENCOUNTER — Other Ambulatory Visit: Payer: Self-pay

## 2024-01-20 ENCOUNTER — Other Ambulatory Visit (HOSPITAL_COMMUNITY): Payer: Self-pay

## 2024-01-21 ENCOUNTER — Other Ambulatory Visit (HOSPITAL_COMMUNITY): Payer: Self-pay

## 2024-01-21 ENCOUNTER — Other Ambulatory Visit: Payer: Self-pay

## 2024-01-23 ENCOUNTER — Telehealth: Payer: Self-pay | Admitting: *Deleted

## 2024-01-23 NOTE — Telephone Encounter (Signed)
 Copied from CRM [id number]. Topic: General - Other >> Jan 08, 2024  2:34 PM Miquel SAILOR wrote: Reason for CRM: prosthetic Stocking /foot needs adjustment LEFT foot-Patient is calling on update for order. No order in sytem. Called office and transferred to [facility] >> Jan 23, 2024  3:57 PM Miquel SAILOR wrote: Patient calling on update on issue. Called and transferred to [facility] >> Jan 10, 2024  4:14 PM Miquel SAILOR wrote: Patient calling on update issue. Due to since 08/13 calling office to see on update but n/a will send message. Needs call back ASAP [phone] or [phone] 2 attempt

## 2024-01-24 ENCOUNTER — Other Ambulatory Visit: Payer: Self-pay

## 2024-01-27 ENCOUNTER — Other Ambulatory Visit: Payer: Self-pay | Admitting: Student

## 2024-01-27 DIAGNOSIS — I10 Essential (primary) hypertension: Secondary | ICD-10-CM

## 2024-01-28 ENCOUNTER — Other Ambulatory Visit (HOSPITAL_COMMUNITY): Payer: Self-pay

## 2024-01-28 MED ORDER — LOSARTAN POTASSIUM 100 MG PO TABS
100.0000 mg | ORAL_TABLET | Freq: Every day | ORAL | 1 refills | Status: DC
  Filled 2024-01-28: qty 90, 90d supply, fill #0
  Filled 2024-04-26: qty 90, 90d supply, fill #1

## 2024-01-28 NOTE — Telephone Encounter (Signed)
 Medication sent to pharmacy

## 2024-01-29 ENCOUNTER — Other Ambulatory Visit (HOSPITAL_COMMUNITY): Payer: Self-pay

## 2024-01-30 ENCOUNTER — Telehealth: Payer: Self-pay | Admitting: *Deleted

## 2024-01-30 NOTE — Telephone Encounter (Unsigned)
 Copied from CRM [id number]. Topic: General - Other >> Jan 30, 2024  9:45 AM Mercer PEDLAR wrote: Reason for CRM: Patient is requesting callback from Ms. Melan today after 12PM.   Callback: [phone]

## 2024-01-30 NOTE — Addendum Note (Signed)
 Addended by: KARNA FELLOWS on: 01/30/2024 11:08 AM   Modules accepted: Orders

## 2024-01-30 NOTE — Telephone Encounter (Signed)
 Copied from CRM [id number]. Topic: Clinical - Order For Equipment >> Jan 30, 2024  9:39 AM Mercer PEDLAR wrote: Reason for CRM: Patient called while at [facility] stating that her current foot prosthetics is broken in too many places and she is requesting a prescription for new foot for prosthetics to be sent to fax number below.  Fax: [phone]

## 2024-01-31 NOTE — Progress Notes (Signed)
 This visit was performed by a medical professional under my direct supervision. I was immediately available for consultation/collaboration. I have reviewed and agree with the Annual Wellness Visit documentation.

## 2024-02-04 ENCOUNTER — Inpatient Hospital Stay

## 2024-02-04 ENCOUNTER — Ambulatory Visit: Admitting: Hematology

## 2024-02-11 ENCOUNTER — Other Ambulatory Visit (HOSPITAL_COMMUNITY): Payer: Self-pay

## 2024-02-11 ENCOUNTER — Inpatient Hospital Stay: Admitting: Hematology

## 2024-02-11 ENCOUNTER — Inpatient Hospital Stay: Attending: Hematology

## 2024-02-11 ENCOUNTER — Other Ambulatory Visit: Payer: Self-pay

## 2024-02-11 ENCOUNTER — Other Ambulatory Visit: Payer: Self-pay | Admitting: Student

## 2024-02-11 ENCOUNTER — Other Ambulatory Visit

## 2024-02-11 DIAGNOSIS — Z794 Long term (current) use of insulin: Secondary | ICD-10-CM

## 2024-02-11 DIAGNOSIS — D500 Iron deficiency anemia secondary to blood loss (chronic): Secondary | ICD-10-CM

## 2024-02-11 DIAGNOSIS — D509 Iron deficiency anemia, unspecified: Secondary | ICD-10-CM | POA: Insufficient documentation

## 2024-02-11 LAB — CBC WITH DIFFERENTIAL/PLATELET
Abs Immature Granulocytes: 0.07 K/uL (ref 0.00–0.07)
Basophils Absolute: 0 K/uL (ref 0.0–0.1)
Basophils Relative: 1 %
Eosinophils Absolute: 0.2 K/uL (ref 0.0–0.5)
Eosinophils Relative: 3 %
HCT: 33.3 % — ABNORMAL LOW (ref 36.0–46.0)
Hemoglobin: 10.4 g/dL — ABNORMAL LOW (ref 12.0–15.0)
Immature Granulocytes: 2 %
Lymphocytes Relative: 18 %
Lymphs Abs: 0.9 K/uL (ref 0.7–4.0)
MCH: 27 pg (ref 26.0–34.0)
MCHC: 31.2 g/dL (ref 30.0–36.0)
MCV: 86.5 fL (ref 80.0–100.0)
Monocytes Absolute: 1 K/uL (ref 0.1–1.0)
Monocytes Relative: 20 %
Neutro Abs: 2.7 K/uL (ref 1.7–7.7)
Neutrophils Relative %: 56 %
Platelets: 289 K/uL (ref 150–400)
RBC: 3.85 MIL/uL — ABNORMAL LOW (ref 3.87–5.11)
RDW: 19.9 % — ABNORMAL HIGH (ref 11.5–15.5)
WBC: 4.8 K/uL (ref 4.0–10.5)
nRBC: 0 % (ref 0.0–0.2)

## 2024-02-11 LAB — FERRITIN: Ferritin: 34 ng/mL (ref 11–307)

## 2024-02-11 MED ORDER — TRULICITY 0.75 MG/0.5ML ~~LOC~~ SOAJ
0.7500 mg | SUBCUTANEOUS | 3 refills | Status: DC
  Filled 2024-02-11: qty 6, 84d supply, fill #0
  Filled 2024-04-29: qty 6, 84d supply, fill #1

## 2024-02-11 NOTE — Telephone Encounter (Signed)
 Medication has expired off of med list

## 2024-02-14 ENCOUNTER — Other Ambulatory Visit: Payer: Self-pay

## 2024-02-26 ENCOUNTER — Other Ambulatory Visit (HOSPITAL_BASED_OUTPATIENT_CLINIC_OR_DEPARTMENT_OTHER): Payer: Self-pay

## 2024-02-26 ENCOUNTER — Encounter: Payer: Self-pay | Admitting: Student

## 2024-02-26 ENCOUNTER — Telehealth: Payer: Self-pay | Admitting: *Deleted

## 2024-02-26 ENCOUNTER — Other Ambulatory Visit: Payer: Self-pay

## 2024-02-26 ENCOUNTER — Other Ambulatory Visit: Payer: Self-pay | Admitting: Student

## 2024-02-26 ENCOUNTER — Other Ambulatory Visit (HOSPITAL_COMMUNITY): Payer: Self-pay

## 2024-02-26 ENCOUNTER — Ambulatory Visit (INDEPENDENT_AMBULATORY_CARE_PROVIDER_SITE_OTHER): Payer: Self-pay | Admitting: Student

## 2024-02-26 VITALS — BP 132/80 | HR 98 | Temp 98.2°F | Ht 61.0 in | Wt 148.8 lb

## 2024-02-26 DIAGNOSIS — Z720 Tobacco use: Secondary | ICD-10-CM

## 2024-02-26 DIAGNOSIS — R21 Rash and other nonspecific skin eruption: Secondary | ICD-10-CM

## 2024-02-26 DIAGNOSIS — Z23 Encounter for immunization: Secondary | ICD-10-CM

## 2024-02-26 DIAGNOSIS — E1122 Type 2 diabetes mellitus with diabetic chronic kidney disease: Secondary | ICD-10-CM

## 2024-02-26 DIAGNOSIS — I10 Essential (primary) hypertension: Secondary | ICD-10-CM

## 2024-02-26 DIAGNOSIS — F17200 Nicotine dependence, unspecified, uncomplicated: Secondary | ICD-10-CM

## 2024-02-26 MED ORDER — ROSUVASTATIN CALCIUM 10 MG PO TABS
10.0000 mg | ORAL_TABLET | Freq: Every morning | ORAL | 11 refills | Status: AC
  Filled 2024-02-26 – 2024-03-22 (×2): qty 90, 90d supply, fill #0
  Filled 2024-06-20: qty 90, 90d supply, fill #1

## 2024-02-26 MED ORDER — AMLODIPINE BESYLATE 10 MG PO TABS
10.0000 mg | ORAL_TABLET | Freq: Every day | ORAL | 3 refills | Status: AC
  Filled 2024-02-26 – 2024-04-26 (×2): qty 90, 90d supply, fill #0

## 2024-02-26 MED ORDER — PANTOPRAZOLE SODIUM 40 MG PO TBEC
40.0000 mg | DELAYED_RELEASE_TABLET | Freq: Every day | ORAL | 3 refills | Status: AC
  Filled 2024-02-26: qty 90, 90d supply, fill #0
  Filled 2024-05-26: qty 90, 90d supply, fill #1

## 2024-02-26 MED ORDER — TRIAMCINOLONE ACETONIDE 0.1 % EX CREA
TOPICAL_CREAM | Freq: Two times a day (BID) | CUTANEOUS | 1 refills | Status: AC
  Filled 2024-02-26: qty 60, 30d supply, fill #0
  Filled 2024-02-26: qty 1, 1d supply, fill #0

## 2024-02-26 MED ORDER — TERBINAFINE HCL 1 % EX CREA
1.0000 | TOPICAL_CREAM | Freq: Two times a day (BID) | CUTANEOUS | 0 refills | Status: DC
  Filled 2024-02-26 – 2024-02-28 (×3): qty 30, 15d supply, fill #0

## 2024-02-26 NOTE — Telephone Encounter (Signed)
 Will froward to C. [place] and [employer]. Copied from CRM #[id number]. Topic: General - Other >> Feb 26, 2024  1:48 PM Miquel SAILOR wrote: Reason for CRM:  Dorthea from [facility]/[phone]   If fax for Standard written 2 L codes Form faxed over.Needs call back ASAP on PCP needs to  sign and date before 10/02 at 8:30 then send back. Need call back

## 2024-02-26 NOTE — Progress Notes (Addendum)
 CC: Chronic condition follow-up  HPI:  [name] is a 73 y.o. female with past medical history of hypertension, GERD, type 2 diabetes, C Kd stage IIIb, depression who presents for follow-up appointment.  Please see assessment plan for full HPI.  Medications: Hypertension: Amlodipine  10 mg daily, losartan  100 mg daily GERD: Pepcid  20 mg twice daily Itching: Sarna lotion Tinea Manuum: Terbinafine cream, triamcinolone  lotion Type 2 diabetes: Trulicity  0.5 mg weekly, Farxiga  10 mg daily, gabapentin  400 mg in the morning and afternoon, 800 mg nightly Gout: Allopurinol  50 mg daily Pain: Tylenol  1000 mg every 6 hours as needed HLD: Crestor  10 mg daily  Past Medical History:  Diagnosis Date   Allergic rhinitis    Anemia, iron deficiency 05/03/2011   2/2 GI AVMs - recieves iron infusions as well as periodic red blood cell transfusions   Anxiety    Arteriovenous malformation of duodenum 02/25/2015   CARPAL TUNNEL RELEASE, HX OF 07/22/2008   Chest pain 10/28/2019   Colon polyps    Diabetes mellitus without complication (HCC)    type 2   Difficulty sleeping    takes trazadone for sleep   Diverticulosis    Electrolyte abnormality 01/25/2021   Fall 01/03/2020   GERD (gastroesophageal reflux disease)    GI bleed 07/07/2020   Hepatitis C    in SVR with [facility]  12/2014-02/2015.    History of hernia repair 08/18/2012   History of porphyria 11/30/2014   Porphyria cutanea tarda in setting of chronic Hepatitis C, frequent  IV iron infusions, and alcohol abuse    Hypertension    Hypocalcemia 10/28/2019   Hypocalcemia 01/19/2021   Hypokalemia 09/27/2014   Hypokalemia 01/19/2021   Hypomagnesemia 01/19/2021   Kidney disease 02/04/2019   Peripheral vascular disease    Prolonged QT interval 01/19/2021   Stroke (HCC) 2010   no residual deficits   Substance use disorder    cocaine   Tachycardia 07/10/2017   Trichomonas vaginitis 04/16/2019     Current Outpatient Medications:    terbinafine  (LAMISIL) 1 % cream, Apply 1 Application topically 2 (two) times daily., Disp: 30 g, Rfl: 0   triamcinolone  cream-Minerin Creme, Apply topically 2 (two) times daily., Disp: 60 g, Rfl: 1   Accu-Chek FastClix Lancets MISC, Use to test blood glucose 3 times daily., Disp: 100 each, Rfl: 11   acetaminophen  (TYLENOL ) 500 MG tablet, Take 1,000 mg by mouth every 6 (six) hours as needed for mild pain (pain score 1-3) or moderate pain (pain score 4-6)., Disp: , Rfl:    allopurinol  (ZYLOPRIM ) 100 MG tablet, Take 1/2 tablet (50 mg total) by mouth daily.  Begin medication after one week of colchicine  treatment., Disp: 30 tablet, Rfl: 2   amLODipine  (NORVASC ) 10 MG tablet, Take 1 tablet (10 mg total) by mouth daily., Disp: 90 tablet, Rfl: 3   Blood Glucose Monitoring Suppl (ACCU-CHEK GUIDE) w/Device KIT, 1 each by Does not apply route 3 (three) times daily. The patient is insulin  requiring, ICD 10 code E11.65. The patient tests 3 times a day., Disp: 1 kit, Rfl: 1   calcium -vitamin D  (OSCAL WITH D) 500-200 MG-UNIT tablet, Take 1 tablet by mouth 2 (two) times daily., Disp: 60 tablet, Rfl: 0   camphor-menthol  (SARNA) lotion, Apply 1 Application topically as needed for itching., Disp: 222 mL, Rfl: 1   dapagliflozin  propanediol (FARXIGA ) 10 MG TABS tablet, Take 1 tablet by mouth daily., Disp: 30 tablet, Rfl: 11   Dulaglutide  (TRULICITY ) 0.75 MG/0.5ML SOAJ, Inject 0.75  mg into the skin once a week., Disp: 6 mL, Rfl: 3   famotidine  (PEPCID ) 20 MG tablet, Take 1 tablet (20 mg total) by mouth 2 (two) times daily. (Patient taking differently: Take 20 mg by mouth as needed for heartburn or indigestion.), Disp: 60 tablet, Rfl: 1   gabapentin  (NEURONTIN ) 400 MG capsule, Take 1 capsule (400 mg total) by mouth in the morning AND 1 capsule (400 mg total) daily in the afternoon AND 2 capsules (800 mg total) before bedtime., Disp: 360 capsule, Rfl: 3   glucose blood (ACCU-CHEK GUIDE) test strip, Use as instructed, Disp: 100 strip,  Rfl: 3   losartan  (COZAAR ) 100 MG tablet, Take 1 tablet (100 mg total) by mouth daily., Disp: 90 tablet, Rfl: 1   pantoprazole  (PROTONIX ) 40 MG tablet, Take 1 tablet (40 mg total) by mouth daily., Disp: 90 tablet, Rfl: 3   rosuvastatin  (CRESTOR ) 10 MG tablet, Take 1 tablet (10 mg total) by mouth in the morning., Disp: 90 tablet, Rfl: 11  Review of Systems:   Negative except for what is stated in HPI  Physical Exam:  Vitals:   02/26/24 0825  BP: 132/80  Pulse: 98  Temp: 98.2 F (36.8 C)  TempSrc: Oral  SpO2: 96%  Weight: 148 lb 12.8 oz (67.5 kg)  Height: 5' 1 (1.549 m)   General: Patient is sitting comfortably in the room  Head: Normocephalic, atraumatic  Cardio: Regular rate and rhythm, no murmurs, rubs or gallops Pulmonary: Clear to ausculation bilaterally with no rales, rhonchi, and crackles  Skin: Bilateral upper extremities with scaly rash noted to palms of hands.  See picture.   right lower extremity with worsening scaly rash noted to sole of right lower extremity.  Please see picture in chart no obvious signs of erythema.   Assessment & Plan:   Assessment & Plan Type 2 diabetes mellitus with chronic kidney disease, without long-term current use of insulin , unspecified CKD stage (HCC) Patient has past medical history of type 2 diabetes mellitus.  A1c at 5.8 on November 22, 2023.  Patient is well-controlled.  Medications time include Trulicity  0.75 mg weekly, and Farxiga  10 mg daily.  She does have a history of microalbuminuria being followed by [facility].  No acute concerns today.  Plan: - Update urine microalbumin creatinine ratio today - Continue Trulicity  0.75 mg weekly - Continue Farxiga  10 mg daily - Continue to follow with [facility] - Foot exam updated today - Follow-up in 3 months Essential hypertension, benign Patient with past medical history of hypertension.  Previously on metoprolol , and then switched to carvedilol .  Patient did not taking her  carvedilol  as she was told not to take it.  Her medications at this time are losartan  100 mg daily, and amlodipine  10 mg daily.  Blood pressure today at 132/80.  She denies any chest pain or shortness of breath.  She denies any vision changes.  Plan: - On chart review, patient was to take carvedilol , but due to nonadherence and well blood pressure control, can keep her off of carvedilol  at this time, but if patient does have uncontrolled blood pressure, can potentially start her back on carvedilol  in the future - Continue amlodipine  10 mg daily - Continue losartan  100 mg daily Rash Patient presents for worsening rash over left lower extremity.  Please see pictures in the chart.  Patient was recently seen about 1 month ago for the same concerns.  She was given clotrimazole  cream, which did not help.  She  denies any itchiness or redness.  She states it just does not look good to her.  Will refer to dermatology for further evaluation.  Could be tinea, psoriasis, or eczema.  Will trial terbinafine cream and triamcinolone  cream today.  Plan: - Stop clotrimazole  cream - Start terbinafine cream and triamcinolone  cream - Refer to dermatology Flu vaccine need Administered flu vaccine today. Tobacco use disorder Patient has previously tried Chantix  and did not work for her.  She is not wanting to quit smoking at this time.  Smoking cessation counseling provided today.  Spent 5 minutes on counseling.   Patient discussed with Dr. Ronnald Sergeant  Libby Blanch, DO Internal Medicine Resident PGY-3

## 2024-02-26 NOTE — Assessment & Plan Note (Addendum)
 Patient with past medical history of hypertension.  Previously on metoprolol , and then switched to carvedilol .  Patient did not taking her carvedilol  as she was told not to take it.  Her medications at this time are losartan  100 mg daily, and amlodipine  10 mg daily.  Blood pressure today at 132/80.  She denies any chest pain or shortness of breath.  She denies any vision changes.  Plan: - On chart review, patient was to take carvedilol , but due to nonadherence and well blood pressure control, can keep her off of carvedilol  at this time, but if patient does have uncontrolled blood pressure, can potentially start her back on carvedilol  in the future - Continue amlodipine  10 mg daily - Continue losartan  100 mg daily

## 2024-02-26 NOTE — Patient Instructions (Addendum)
 [name],Thank you for allowing me to take part in your care today.  Here are your instructions.  1.  I have referred you to dermatology.  I sent in 2 new creams.  1 is a steroid cream.  The other is a antifungal cream.  Please take both of these twice a day.  2.  I have removed carvedilol  from your med list.  3.  I am checking your urine today.  I will call you with results  4.  Please come back in 3 months.  5.  You have received your flu vaccine today.   PLEASE BRING YOUR MEDICATIONS TO EVERY APPOINTMENT  Thank you, Dr. Tobie  If you have any other questions please contact the internal medicine clinic at [phone] If it is after hours, please call the [place] [facility] at [phone] and then ask the person who picks up for the resident on call.

## 2024-02-26 NOTE — Assessment & Plan Note (Signed)
 Patient has previously tried Chantix  and did not work for her.  She is not wanting to quit smoking at this time.  Smoking cessation counseling provided today.  Spent 5 minutes on counseling.

## 2024-02-26 NOTE — Assessment & Plan Note (Addendum)
 Patient has past medical history of type 2 diabetes mellitus.  A1c at 5.8 on November 22, 2023.  Patient is well-controlled.  Medications time include Trulicity  0.75 mg weekly, and Farxiga  10 mg daily.  She does have a history of microalbuminuria being followed by [facility].  No acute concerns today.  Plan: - Update urine microalbumin creatinine ratio today - Continue Trulicity  0.75 mg weekly - Continue Farxiga  10 mg daily - Continue to follow with [facility] - Foot exam updated today - Follow-up in 3 months

## 2024-02-27 ENCOUNTER — Ambulatory Visit: Payer: Self-pay | Admitting: Student

## 2024-02-27 ENCOUNTER — Other Ambulatory Visit (HOSPITAL_COMMUNITY): Payer: Self-pay

## 2024-02-27 LAB — MICROALBUMIN / CREATININE URINE RATIO
Creatinine, Urine: 53.8 mg/dL
Microalb/Creat Ratio: 238 mg/g{creat} — ABNORMAL HIGH (ref 0–29)
Microalbumin, Urine: 128.2 ug/mL

## 2024-02-27 NOTE — Telephone Encounter (Signed)
 Stated Dr Tobie had called her and she missed the call. I told pt he had sent her a My Chart message- stated she's doesn't know how to use My Chart. Informed pt  Regarding your urine results, the protein that you have been spilling into your urine has decreased.  This is a good sign.  Continue taking all of your medications as prescribed. If you have any questions or concerns please do not hesitate to call the clinic at [phone]. Per Dr Tobie.

## 2024-02-27 NOTE — Progress Notes (Signed)
 Internal Medicine Clinic Attending  Case discussed with the resident at the time of the visit.  We reviewed the resident's history and exam and pertinent patient test results.  I agree with the assessment, diagnosis, and plan of care documented in the resident's note.

## 2024-02-28 ENCOUNTER — Other Ambulatory Visit: Payer: Self-pay

## 2024-02-28 ENCOUNTER — Other Ambulatory Visit (HOSPITAL_COMMUNITY): Payer: Self-pay

## 2024-02-29 ENCOUNTER — Other Ambulatory Visit (HOSPITAL_COMMUNITY): Payer: Self-pay

## 2024-03-02 ENCOUNTER — Other Ambulatory Visit: Payer: Self-pay

## 2024-03-03 ENCOUNTER — Encounter: Payer: Self-pay | Admitting: Pharmacist

## 2024-03-03 ENCOUNTER — Other Ambulatory Visit: Payer: Self-pay

## 2024-03-06 ENCOUNTER — Other Ambulatory Visit: Payer: Self-pay

## 2024-03-09 NOTE — Assessment & Plan Note (Signed)
 secondary to GI blood loss, and Anemia of Chronic Disease -longstanding history of intermittent anemia dating back at least 2008 when Hg was 10. -h/o multiple GI bleeding in 2016, required blood transfusion, hospitalization and IV Feraheme  -EGD and colonoscopy on 10/29/17 and 03/17/18 with Dr. Dominic Friendly, which were normal. Endoscopy in 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 -Due to recurrent anemia requiring IV Feraheme  and blood transfusions, she started bevacizumab  for GI bleeding on 08/10/20. She responded very well with resolved anemia and less need for iv iron. This has been held intermittently due to elevated urine protein. Last dose given 04/03/22. -She is clinically doing well, no melena or other GI bleeding lately.  Lab reviewed, hemoglobin 11.9, iron level still pending. -Will continue IV iron needed -She has cut down her marijuana and cocaine use, I again encouraged her to completely stop. Will hold beva if she is still actively using cocaine

## 2024-03-10 ENCOUNTER — Telehealth: Payer: Self-pay

## 2024-03-10 ENCOUNTER — Inpatient Hospital Stay: Attending: Hematology | Admitting: Hematology

## 2024-03-10 ENCOUNTER — Inpatient Hospital Stay

## 2024-03-10 VITALS — BP 122/78 | HR 99 | Temp 97.8°F | Resp 17 | Ht 61.0 in | Wt 146.0 lb

## 2024-03-10 DIAGNOSIS — K922 Gastrointestinal hemorrhage, unspecified: Secondary | ICD-10-CM | POA: Diagnosis not present

## 2024-03-10 DIAGNOSIS — F1290 Cannabis use, unspecified, uncomplicated: Secondary | ICD-10-CM | POA: Diagnosis not present

## 2024-03-10 DIAGNOSIS — F1490 Cocaine use, unspecified, uncomplicated: Secondary | ICD-10-CM | POA: Insufficient documentation

## 2024-03-10 DIAGNOSIS — D500 Iron deficiency anemia secondary to blood loss (chronic): Secondary | ICD-10-CM | POA: Diagnosis not present

## 2024-03-10 LAB — CBC WITH DIFFERENTIAL/PLATELET
Abs Immature Granulocytes: 0.1 K/uL — ABNORMAL HIGH (ref 0.00–0.07)
Basophils Absolute: 0.1 K/uL (ref 0.0–0.1)
Basophils Relative: 1 %
Eosinophils Absolute: 0.2 K/uL (ref 0.0–0.5)
Eosinophils Relative: 3 %
HCT: 34.4 % — ABNORMAL LOW (ref 36.0–46.0)
Hemoglobin: 10.5 g/dL — ABNORMAL LOW (ref 12.0–15.0)
Immature Granulocytes: 1 %
Lymphocytes Relative: 18 %
Lymphs Abs: 1.5 K/uL (ref 0.7–4.0)
MCH: 25.3 pg — ABNORMAL LOW (ref 26.0–34.0)
MCHC: 30.5 g/dL (ref 30.0–36.0)
MCV: 82.9 fL (ref 80.0–100.0)
Monocytes Absolute: 1.1 K/uL — ABNORMAL HIGH (ref 0.1–1.0)
Monocytes Relative: 13 %
Neutro Abs: 5.4 K/uL (ref 1.7–7.7)
Neutrophils Relative %: 64 %
Platelets: 359 K/uL (ref 150–400)
RBC: 4.15 MIL/uL (ref 3.87–5.11)
RDW: 18.2 % — ABNORMAL HIGH (ref 11.5–15.5)
WBC: 8.3 K/uL (ref 4.0–10.5)
nRBC: 0 % (ref 0.0–0.2)

## 2024-03-10 LAB — FERRITIN: Ferritin: 14 ng/mL (ref 11–307)

## 2024-03-10 NOTE — Telephone Encounter (Signed)
 Dr. Lanny, patient will be scheduled as soon as possible.  Auth Submission: NO AUTH NEEDED Site of care: Site of care: [facility] Payer: [employer] dual complete medicare Medication & CPT/J Code(s) submitted: Feraheme  (ferumoxytol ) [id number] Diagnosis Code:  Route of submission (phone, fax, portal):  Phone # Fax # Auth type: Buy/Bill PB Units/visits requested: 510mg  x 2 doses Reference number:  Approval from: 03/10/24 to 05/27/24

## 2024-03-10 NOTE — Progress Notes (Signed)
 [facility]   Telephone:([phone]) [phone] Fax:(336) [phone]   Clinic Follow up Note   Patient Care Team: Harrie Bruckner, DO as PCP - General Octavia Charleston, MD as Consulting Physician (Ophthalmology) Vernetta Berg, MD as Consulting Physician (General Surgery) Lanny Callander, MD as Consulting Physician (Hematology) Comer, [place] ORN, MD as Consulting Physician (Infectious Diseases) Legrand Victory LITTIE DOUGLAS, MD as Consulting Physician (Gastroenterology)  Date of Service:  03/10/2024  CHIEF COMPLAINT: f/u of anemia   CURRENT THERAPY:  IV Feraheme  as needed if ferritin less than 100  Oncology History   Iron deficiency anemia due to chronic blood loss secondary to GI blood loss, and Anemia of Chronic Disease -longstanding history of intermittent anemia dating back at least 2008 when Hg was 10. -h/o multiple GI bleeding in 2016, required blood transfusion, hospitalization and IV Feraheme  -EGD and colonoscopy on 10/29/17 and 03/17/18 with Dr. Legrand, which were normal. Endoscopy in [facility] and was found to have AVM in small bowel. -Due to recurrent anemia requiring IV Feraheme  and blood transfusions, she started bevacizumab  for GI bleeding on 08/10/20. She responded very well with resolved anemia and less need for iv iron. This has been held intermittently due to elevated urine protein. Last dose given 04/03/22. -She is clinically doing well, no melena or other GI bleeding lately.  Lab reviewed, hemoglobin 11.9, iron level still pending. -Will continue IV iron needed -She has cut down her marijuana and cocaine use, I again encouraged her to completely stop. Will hold beva if she is still actively using cocaine   Assessment & Plan Iron deficiency anemia Chronic iron deficiency anemia with well-managed hemoglobin at 10.5 g/dL. Previous ferritin was 34 ng/mL, indicating likely need for IV iron supplementation. No reported bleeding or adverse symptoms. She feels well and is compliant  with medications and exercise. - Monitor iron levels and order IV iron infusion if levels are low, aiming for ferritin above 50 ng/mL. - Order two IV iron infusions, contingent on ferritin levels. - Instruct her to take Benadryl  prior to infusion if previously tolerated. - Schedule monthly lab follow-ups to monitor iron levels.   Plan - Will schedule Feraheme  1 dose if ferritin between 50-100, and 2 doses if less than 50 - Will schedule lab monthly - Follow-up in 4 months  Discussed the use of AI scribe software for clinical note transcription with the patient, who gave verbal consent to proceed.  History of Present Illness [name] is a 73 year old female with anemia who presents for follow-up.  Her hemoglobin level is 10.5. Her iron level is pending, and her last IV iron infusion was in August, approximately two months ago. She receives her infusions at [facility] and takes Benadryl  prior to the infusion without issues. She reports no bleeding and feels well. She takes all her medications daily and engages in walking and exercise.     All other systems were reviewed with the patient and are negative.  MEDICAL HISTORY:  Past Medical History:  Diagnosis Date   Allergic rhinitis    Anemia, iron deficiency 05/03/2011   2/2 GI AVMs - recieves iron infusions as well as periodic red blood cell transfusions   Anxiety    Arteriovenous malformation of duodenum 02/25/2015   CARPAL TUNNEL RELEASE, HX OF 07/22/2008   Chest pain 10/28/2019   Colon polyps    Diabetes mellitus without complication (HCC)    type 2   Difficulty sleeping    takes trazadone for sleep  Diverticulosis    Electrolyte abnormality 01/25/2021   Fall 01/03/2020   GERD (gastroesophageal reflux disease)    GI bleed 07/07/2020   Hepatitis C    in SVR with Harvoni  12/2014-02/2015.    History of hernia repair 08/18/2012   History of porphyria 11/30/2014   Porphyria cutanea tarda in setting of chronic Hepatitis  C, frequent  IV iron infusions, and alcohol abuse    Hypertension    Hypocalcemia 10/28/2019   Hypocalcemia 01/19/2021   Hypokalemia 09/27/2014   Hypokalemia 01/19/2021   Hypomagnesemia 01/19/2021   Kidney disease 02/04/2019   Peripheral vascular disease    Prolonged QT interval 01/19/2021   Stroke (HCC) 2010   no residual deficits   Substance use disorder    cocaine   Tachycardia 07/10/2017   Trichomonas vaginitis 04/16/2019    SURGICAL HISTORY: Past Surgical History:  Procedure Laterality Date   ABDOMINAL HYSTERECTOMY     APPLICATION OF WOUND VAC N/A 06/21/2014   Procedure: APPLICATION OF WOUND VAC;  Surgeon: Vicenta Poli, MD;  Location: [facility] OR;  Service: General;  Laterality: N/A;   CARPAL TUNNEL RELEASE     rt hand   COLONOSCOPY WITH PROPOFOL  N/A 03/17/2018   Procedure: COLONOSCOPY WITH PROPOFOL ;  Surgeon: Legrand Victory LITTIE DOUGLAS, MD;  Location: [facility];  Service: Gastroenterology;  Laterality: N/A;   ENTEROSCOPY N/A 12/04/2012   Procedure: ENTEROSCOPY;  Surgeon: Belvie JONETTA Just, MD;  Location: [facility];  Service: Endoscopy;  Laterality: N/A;   ENTEROSCOPY N/A 12/18/2012   Procedure: ENTEROSCOPY;  Surgeon: Belvie JONETTA Just, MD;  Location: [facility];  Service: Endoscopy;  Laterality: N/A;   ENTEROSCOPY N/A 02/25/2015   Procedure: ENTEROSCOPY;  Surgeon: Lamar JONETTA Aho, MD;  Location: [facility] ENDOSCOPY;  Service: Endoscopy;  Laterality: N/A;   ENTEROSCOPY N/A 10/29/2017   Procedure: ENTEROSCOPY;  Surgeon: Legrand Victory LITTIE DOUGLAS, MD;  Location: [facility];  Service: Gastroenterology;  Laterality: N/A;   ENTEROSCOPY N/A 07/08/2020   Procedure: ENTEROSCOPY;  Surgeon: Teressa Toribio SQUIBB, MD;  Location: [facility];  Service: Endoscopy;  Laterality: N/A;   ENTEROSCOPY N/A 08/10/2023   Procedure: ENTEROSCOPY;  Surgeon: Suzann Inocente HERO, MD;  Location: [facility] ENDOSCOPY;  Service: Gastroenterology;  Laterality: N/A;   ESOPHAGOGASTRODUODENOSCOPY  05/05/2011   Procedure: ESOPHAGOGASTRODUODENOSCOPY (EGD);   Surgeon: Gordy Starch, MD;  Location: [facility];  Service: Gastroenterology;  Laterality: N/A;  Dr. Starch will do procedure for Dr. Just Saturday.   ESOPHAGOGASTRODUODENOSCOPY  05/08/2011   Procedure: ESOPHAGOGASTRODUODENOSCOPY (EGD);  Surgeon: Belvie JONETTA Just;  [facility]: [facility];  Service: Endoscopy;  Laterality: N/A;   ESOPHAGOGASTRODUODENOSCOPY  06/07/2011   Procedure: ESOPHAGOGASTRODUODENOSCOPY (EGD);  Surgeon: Belvie JONETTA Just, MD;  Location: [facility];  Service: Endoscopy;  Laterality: N/A;   ESOPHAGOGASTRODUODENOSCOPY  12/20/2011   Procedure: ESOPHAGOGASTRODUODENOSCOPY (EGD);  Surgeon: Belvie JONETTA Just, MD;  Location: [facility];  Service: Endoscopy;  Laterality: N/A;   EYE SURGERY Bilateral    cataracts   FLEXIBLE SIGMOIDOSCOPY  12/21/2011   Procedure: FLEXIBLE SIGMOIDOSCOPY;  Surgeon: Belvie JONETTA Just, MD;  Location: [facility];  Service: Endoscopy;  Laterality: N/A;   HARDWARE REMOVAL Right 08/08/2023   Procedure: HARDWARE REMOVAL RIGHT WRIST;  Surgeon: Murrell Drivers, MD;  Location: [facility];  Service: Orthopedics;  Laterality: Right;  60 MIN   HOT HEMOSTASIS  06/07/2011   Procedure: HOT HEMOSTASIS (ARGON PLASMA COAGULATION/BICAP);  Surgeon: Belvie JONETTA Just, MD;  Location: [facility];  Service: Endoscopy;  Laterality: N/A;   HOT HEMOSTASIS N/A 07/08/2020   Procedure:  HOT HEMOSTASIS (ARGON PLASMA COAGULATION/BICAP);  Surgeon: Teressa Toribio SQUIBB, MD;  Location: [facility];  Service: Endoscopy;  Laterality: N/A;   HOT HEMOSTASIS N/A 08/10/2023   Procedure: EGD, WITH ARGON PLASMA COAGULATION;  Surgeon: Suzann Inocente HERO, MD;  Location: [facility] ENDOSCOPY;  Service: Gastroenterology;  Laterality: N/A;   INCISIONAL HERNIA REPAIR N/A 06/01/2014   Procedure: ATTEMPTED LAPAROSCOPIC AND OPEN INCISIONAL HERNIA REPAIR WITH MESH;  Surgeon: Vicenta Poli, MD;  Location: [facility] OR;  Service: General;  Laterality: N/A;   INSERTION OF MESH N/A 06/01/2014   Procedure: INSERTION OF MESH;  Surgeon:  Vicenta Poli, MD;  Location: [facility];  Service: General;  Laterality: N/A;   LAPAROTOMY  02/16/2012   Procedure: EXPLORATORY LAPAROTOMY;  Surgeon: Morene ONEIDA Olives, MD;  Location: [facility];  Service: General;  Laterality: N/A;  oversewing of anastomotic leak and rigid sigmoidoscopy   LAPAROTOMY N/A 06/21/2014   Procedure: ABDOMINAL WOUND EXPLORATION;  Surgeon: Vicenta Poli, MD;  Location: [facility];  Service: General;  Laterality: N/A;   LEG AMPUTATION ABOVE KNEE     left   PARTIAL COLECTOMY  02/15/2012   Procedure: PARTIAL COLECTOMY;  Surgeon: Vicenta DELENA Poli, MD;  Location: [facility];  Service: General;  Laterality: N/A;   SUBMUCOSAL INJECTION  07/08/2020   Procedure: SUBMUCOSAL INJECTION;  Surgeon: Teressa Toribio SQUIBB, MD;  Location: [facility];  Service: Endoscopy;;   TONSILLECTOMY     WRIST FUSION Right 04/27/2021   Procedure: SCAPHOID EXCISION  WITH 4 BONE FUSION RIGHT WRIST;  Surgeon: Murrell Drivers, MD;  Location: [facility];  Service: Orthopedics;  Laterality: Right;    I have reviewed the social history and family history with the patient and they are unchanged from previous note.  ALLERGIES:  is allergic to ciprofloxacin , morphine  and codeine, penicillins, codeine, lisinopril , and feraheme  [ferumoxytol ].  MEDICATIONS:  Current Outpatient Medications  Medication Sig Dispense Refill   Accu-Chek FastClix Lancets MISC Use to test blood glucose 3 times daily. 100 each 11   acetaminophen  (TYLENOL ) 500 MG tablet Take 1,000 mg by mouth every 6 (six) hours as needed for mild pain (pain score 1-3) or moderate pain (pain score 4-6).     allopurinol  (ZYLOPRIM ) 100 MG tablet Take 1/2 tablet (50 mg total) by mouth daily.  Begin medication after one week of colchicine  treatment. 30 tablet 2   amLODipine  (NORVASC ) 10 MG tablet Take 1 tablet (10 mg total) by mouth daily. 90 tablet 3   Blood Glucose Monitoring Suppl (ACCU-CHEK GUIDE) w/Device KIT 1 each by Does not apply route 3 (three)  times daily. The patient is insulin  requiring, ICD 10 code E11.65. The patient tests 3 times a day. 1 kit 1   calcium -vitamin D  (OSCAL WITH D) 500-200 MG-UNIT tablet Take 1 tablet by mouth 2 (two) times daily. 60 tablet 0   camphor-menthol  (SARNA) lotion Apply 1 Application topically as needed for itching. 222 mL 1   dapagliflozin  propanediol (FARXIGA ) 10 MG TABS tablet Take 1 tablet by mouth daily. 30 tablet 11   Dulaglutide  (TRULICITY ) 0.75 MG/0.5ML SOAJ Inject 0.75 mg into the skin once a week. 6 mL 3   famotidine  (PEPCID ) 20 MG tablet Take 1 tablet (20 mg total) by mouth 2 (two) times daily. (Patient taking differently: Take 20 mg by mouth as needed for heartburn or indigestion.) 60 tablet 1   gabapentin  (NEURONTIN ) 400 MG capsule Take 1 capsule (400 mg total) by mouth in the morning AND 1 capsule (400 mg total) daily in the  afternoon AND 2 capsules (800 mg total) before bedtime. 360 capsule 3   glucose blood (ACCU-CHEK GUIDE) test strip Use as instructed 100 strip 3   losartan  (COZAAR ) 100 MG tablet Take 1 tablet (100 mg total) by mouth daily. 90 tablet 1   pantoprazole  (PROTONIX ) 40 MG tablet Take 1 tablet (40 mg total) by mouth daily. 90 tablet 3   rosuvastatin  (CRESTOR ) 10 MG tablet Take 1 tablet (10 mg total) by mouth in the morning. 90 tablet 11   terbinafine (LAMISIL) 1 % cream Apply 1 Application topically 2 (two) times daily. 30 g 0   triamcinolone  cream-Minerin Creme Apply topically 2 (two) times daily. 60 g 1   No current facility-administered medications for this visit.    PHYSICAL EXAMINATION: ECOG PERFORMANCE STATUS: 1 - Symptomatic but completely ambulatory  Vitals:   03/10/24 0851  BP: 122/78  Pulse: 99  Resp: 17  Temp: 97.8 F (36.6 C)  SpO2: 100%   Wt Readings from Last 3 Encounters:  03/10/24 146 lb (66.2 kg)  02/26/24 148 lb 12.8 oz (67.5 kg)  01/15/24 152 lb (68.9 kg)     GENERAL:alert, no distress and comfortable SKIN: skin color, texture, turgor are  normal, no rashes or significant lesions EYES: normal, Conjunctiva are pink and non-injected, sclera clear Musculoskeletal:no cyanosis of digits and no clubbing  NEURO: alert & oriented x 3 with fluent speech, no focal motor/sensory deficits  Physical Exam    LABORATORY DATA:  I have reviewed the data as listed    Latest Ref Rng & Units 03/10/2024    8:32 AM 02/11/2024    7:46 AM 01/14/2024    7:29 AM  CBC  WBC 4.0 - 10.5 K/uL 8.3  4.8  5.9   Hemoglobin 12.0 - 15.0 g/dL 89.4  89.5  9.7   Hematocrit 36.0 - 46.0 % 34.4  33.3  31.1   Platelets 150 - 400 K/uL 359  289  265         Latest Ref Rng & Units 10/14/2023   10:27 AM 09/19/2023   10:29 AM 08/10/2023    7:05 AM  CMP  Glucose 70 - 99 mg/dL 889  896  884   BUN 8 - 23 mg/dL 17  19  33   Creatinine 0.44 - 1.00 mg/dL 8.75  8.45  8.26   Sodium 135 - 145 mmol/L 140  139  138   Potassium 3.5 - 5.1 mmol/L 3.9  4.8  4.9   Chloride 98 - 111 mmol/L 105  108  110   CO2 22 - 32 mmol/L 27  28  19    Calcium  8.9 - 10.3 mg/dL 9.3  8.6  8.4   Total Protein 6.5 - 8.1 g/dL 7.8  6.8    Total Bilirubin 0.0 - 1.2 mg/dL 0.2  0.1    Alkaline Phos 38 - 126 U/L 87  94    AST 15 - 41 U/L 14  15    ALT 0 - 44 U/L 7  7        RADIOGRAPHIC STUDIES: I have personally reviewed the radiological images as listed and agreed with the findings in the report. No results found.    No orders of the defined types were placed in this encounter.  All questions were answered. The patient knows to call the clinic with any problems, questions or concerns. No barriers to learning was detected. The total time spent in the appointment was 20 minutes, including review of chart and  various tests results, discussions about plan of care and coordination of care plan     Patricia Mattock, MD 03/10/2024

## 2024-03-11 ENCOUNTER — Ambulatory Visit: Payer: Self-pay | Admitting: Student

## 2024-03-11 NOTE — Telephone Encounter (Signed)
 The pt had hung up upon warm transfer. This RN made first attempt to call pt back. This RN called the home phone and got busy signal. This RN then called the mobile phone and left a voicemail requesting a call back. The call back number was provided.    Copied from CRM #[id number]. Topic: Clinical - Red Word Triage >> Mar 11, 2024  8:37 AM Cherylann RAMAN wrote: Red Word that prompted transfer to Nurse Triage: Patient called in with complaints of chest pains that is running down her left arm and side that began yesterday 10/14. Patient is experiencing shortness of breath that began overnight.

## 2024-03-11 NOTE — Telephone Encounter (Signed)
 This RN made third attempt to contact patient with no answer. A voicemail was left with call back number provided.

## 2024-03-11 NOTE — Telephone Encounter (Signed)
 This RN made second attempt to contact patient with no answer. A voicemail was left with call back number provided.

## 2024-03-11 NOTE — Telephone Encounter (Signed)
 I talked to pt who stated she feeling better; no c/o sob, CP, tingling of fingers But she is having some numbness of left arm. Pt uses transportation; unable to come in until next week. Instructed pt to go to the ER if develops cp,sob, dizziness, h/a's before her appt - stated she's aware.  Appt schedule w/Dr Celestina Tuesday 10/21.

## 2024-03-17 ENCOUNTER — Ambulatory Visit: Payer: Self-pay

## 2024-03-17 ENCOUNTER — Ambulatory Visit: Admitting: Student

## 2024-03-17 ENCOUNTER — Ambulatory Visit: Payer: Self-pay | Admitting: Student

## 2024-03-17 NOTE — Telephone Encounter (Addendum)
 I talked to pt who stated she had an appt this am (@0845AM ) but transportation picked her up and took her to [address] She stated she has never been there; stated she knows [facility] new address, stated she's not senile. She asked transportation to bring her here but was told they will not be able to take her home. E2C2 re-scheduled her appt for this afternoon @ 1445PM w/Dr Celestina; pt is hoping transportation will be able to bring her. I asked pt about the CP - stated it started last week; mid - chest, it's still there;no tingling of her left arm. I told pt she needs to go to the ER; stated she does not want to go. I told pt to call 911 d/t transportation issues; stated she will if CP becomes worse.

## 2024-03-17 NOTE — Telephone Encounter (Signed)
 FYI Only or Action Required?: FYI only for provider.  Patient was last seen in primary care on 02/26/2024 by Tobie Gaines, DO.  Called Nurse Triage reporting Chest Pain.  Symptoms began today.  Interventions attempted: Nothing.  Symptoms are: gradually worsening.  Triage Disposition: Go to ED Now  Patient/caregiver understands and will follow disposition?: unsure  Pt calling stating that she was on her way to the appointment, but is having transportation issues. Pt states that her CP has started again, pt states they are severe. Pt advised to go to the ED, pt disconnected call, unsure if pt will go to ED. Pt was concerned that she did not have transportation to the ED, RN advised pt to call 911.   Copied from CRM [id number]. Topic: Clinical - Red Word Triage >> Mar 17, 2024  8:04 AM Cherylann RAMAN wrote: Red Word that prompted transfer to Nurse Triage: Patient called in with complaints of severe chest pains. Denies having any other symptoms

## 2024-03-17 NOTE — Assessment & Plan Note (Signed)
 Type 2 diabetes mellitus with chronic kidney disease, without long-term current use of insulin , unspecified CKD stage (HCC) Well-controlled type 2 diabetes, most recent A1c 5.8.  UACR consistent with severely increased albuminuria, but improving as compared to prior.  She follows with [facility] for this,  has an upcoming appointment  Plan: - IncreaseTrulicity 0.75 mg---> 1.5 mg  - Continue Farxiga  10 mg daily - Continue to follow with [facility] - Follow-up in 3 months  Essential hypertension, benign Stable, on amlodipine  10 mg, losartan  100 mg.  Was previously prescribed carvedilol , however this was stopped as she was not taking her blood pressures have been stable otherwise. - check BMP  Rash Patient presents for worsening rash over left lower extremity.  Please see pictures in the chart.  Patient was recently seen about 1 month ago for the same concerns.  She was given clotrimazole  cream, which did not help.  She denies any itchiness or redness.  She states it just does not look good to her.  Will refer to dermatology for further evaluation.  Could be tinea, psoriasis, or eczema.  Will trial terbinafine cream and triamcinolone  cream today.   Plan: - Start terbinafine cream and triamcinolone  cream - Refer to dermatology

## 2024-03-17 NOTE — Progress Notes (Deleted)
 CC: ***  HPI:  [name] is a 73 y.o. female with a history of hypertension, GERD CKD stage IIIb and presents today for follow-up on chronic medical conditions..   Patient was last seen in resident [facility] about 3 weeks ago.  Worsening rash on left lower extremity ? (Tinnea mannum)previously tried clotrimazole  cream without significant improvement, now switched to terbinafine cream and triamcinolone .  Past Medical History:  Diagnosis Date   Allergic rhinitis    Anemia, iron deficiency 05/03/2011   2/2 GI AVMs - recieves iron infusions as well as periodic red blood cell transfusions   Anxiety    Arteriovenous malformation of duodenum 02/25/2015   CARPAL TUNNEL RELEASE, HX OF 07/22/2008   Chest pain 10/28/2019   Colon polyps    Diabetes mellitus without complication (HCC)    type 2   Difficulty sleeping    takes trazadone for sleep   Diverticulosis    Electrolyte abnormality 01/25/2021   Fall 01/03/2020   GERD (gastroesophageal reflux disease)    GI bleed 07/07/2020   Hepatitis C    in SVR with Harvoni  12/2014-02/2015.    History of hernia repair 08/18/2012   History of porphyria 11/30/2014   Porphyria cutanea tarda in setting of chronic Hepatitis C, frequent  IV iron infusions, and alcohol abuse    Hypertension    Hypocalcemia 10/28/2019   Hypocalcemia 01/19/2021   Hypokalemia 09/27/2014   Hypokalemia 01/19/2021   Hypomagnesemia 01/19/2021   Kidney disease 02/04/2019   Peripheral vascular disease    Prolonged QT interval 01/19/2021   Stroke (HCC) 2010   no residual deficits   Substance use disorder    cocaine   Tachycardia 07/10/2017   Trichomonas vaginitis 04/16/2019    Current Outpatient Medications on File Prior to Visit  Medication Sig Dispense Refill   Accu-Chek FastClix Lancets MISC Use to test blood glucose 3 times daily. 100 each 11   acetaminophen  (TYLENOL ) 500 MG tablet Take 1,000 mg by mouth every 6 (six) hours as needed for mild pain (pain score 1-3) or moderate  pain (pain score 4-6).     allopurinol  (ZYLOPRIM ) 100 MG tablet Take 1/2 tablet (50 mg total) by mouth daily.  Begin medication after one week of colchicine  treatment. 30 tablet 2   amLODipine  (NORVASC ) 10 MG tablet Take 1 tablet (10 mg total) by mouth daily. 90 tablet 3   Blood Glucose Monitoring Suppl (ACCU-CHEK GUIDE) w/Device KIT 1 each by Does not apply route 3 (three) times daily. The patient is insulin  requiring, ICD 10 code E11.65. The patient tests 3 times a day. 1 kit 1   calcium -vitamin D  (OSCAL WITH D) 500-200 MG-UNIT tablet Take 1 tablet by mouth 2 (two) times daily. 60 tablet 0   camphor-menthol  (SARNA) lotion Apply 1 Application topically as needed for itching. 222 mL 1   dapagliflozin  propanediol (FARXIGA ) 10 MG TABS tablet Take 1 tablet by mouth daily. 30 tablet 11   Dulaglutide  (TRULICITY ) 0.75 MG/0.5ML SOAJ Inject 0.75 mg into the skin once a week. 6 mL 3   famotidine  (PEPCID ) 20 MG tablet Take 1 tablet (20 mg total) by mouth 2 (two) times daily. (Patient taking differently: Take 20 mg by mouth as needed for heartburn or indigestion.) 60 tablet 1   gabapentin  (NEURONTIN ) 400 MG capsule Take 1 capsule (400 mg total) by mouth in the morning AND 1 capsule (400 mg total) daily in the afternoon AND 2 capsules (800 mg total) before bedtime. 360 capsule 3  glucose blood (ACCU-CHEK GUIDE) test strip Use as instructed 100 strip 3   losartan  (COZAAR ) 100 MG tablet Take 1 tablet (100 mg total) by mouth daily. 90 tablet 1   pantoprazole  (PROTONIX ) 40 MG tablet Take 1 tablet (40 mg total) by mouth daily. 90 tablet 3   rosuvastatin  (CRESTOR ) 10 MG tablet Take 1 tablet (10 mg total) by mouth in the morning. 90 tablet 11   terbinafine (LAMISIL) 1 % cream Apply 1 Application topically 2 (two) times daily. 30 g 0   triamcinolone  cream-Minerin Creme Apply topically 2 (two) times daily. 60 g 1   No current facility-administered medications on file prior to visit.    Review of Systems: ROS  negative except for what is noted on the assessment and plan.  There were no vitals filed for this visit. {Labs (Optional):23779} {Vitals History (Optional):23777}  Physical Exam: Constitutional: NAD Cardiovascular: RRR, no murmurs. Pulmonary/Chest: Clear bilateral lungs Abdominal: soft, non-tender, non-distended.  Assessment & Plan:   Patient {GC/GE:[id number]::discussed with,seen with} Dr. {WJFZD:[id number]::Tpoopjfd,Z. Hoffman,Chambliss, Winfrey,Lau,Machen}  Assessment & Plan Essential hypertension, benign Type 2 diabetes mellitus with chronic kidney disease, without long-term current use of insulin , unspecified CKD stage (HCC) Well-controlled type 2 diabetes, most recent A1c 5.8.  UACR consistent with severely increased albuminuria, but improving as compared to prior.  She follows with [facility] for this,  has an upcoming appointment  Plan: - IncreaseTrulicity 0.75 mg---> 1.5 mg  - Continue Farxiga  10 mg daily - Continue to follow with [facility] - Follow-up in 3 months  Essential hypertension, benign Stable, on amlodipine  10 mg, losartan  100 mg.  Was previously prescribed carvedilol , however this was stopped as she was not taking her blood pressures have been stable otherwise. - check BMP  Rash Patient presents for worsening rash over left lower extremity.  Please see pictures in the chart.  Patient was recently seen about 1 month ago for the same concerns.  She was given clotrimazole  cream, which did not help.  She denies any itchiness or redness.  She states it just does not look good to her.  Will refer to dermatology for further evaluation.  Could be tinea, psoriasis, or eczema.  Will trial terbinafine cream and triamcinolone  cream today.   Plan: - Start terbinafine cream and triamcinolone  cream - Refer to dermatology  No orders of the defined types were placed in this encounter.   Patricia Sandhoff, MD [facility] Internal Medicine, PGY-2  Date  03/17/2024 Time 8:05 AM

## 2024-03-18 NOTE — Telephone Encounter (Signed)
 F/U call - pt stated she's feeling better; decreased CP. Stated she will call the office or go to ER if needed. And she will call back if she needs an appt.

## 2024-03-19 ENCOUNTER — Ambulatory Visit

## 2024-03-19 VITALS — BP 123/76 | HR 85 | Temp 97.9°F | Resp 20 | Ht 61.0 in | Wt 150.4 lb

## 2024-03-19 DIAGNOSIS — K922 Gastrointestinal hemorrhage, unspecified: Secondary | ICD-10-CM

## 2024-03-19 DIAGNOSIS — D500 Iron deficiency anemia secondary to blood loss (chronic): Secondary | ICD-10-CM | POA: Diagnosis not present

## 2024-03-19 MED ORDER — DIPHENHYDRAMINE HCL 25 MG PO CAPS
25.0000 mg | ORAL_CAPSULE | Freq: Once | ORAL | Status: AC
  Administered 2024-03-19: 25 mg via ORAL
  Filled 2024-03-19: qty 1

## 2024-03-19 MED ORDER — SODIUM CHLORIDE 0.9 % IV SOLN
510.0000 mg | Freq: Once | INTRAVENOUS | Status: AC
  Administered 2024-03-19: 510 mg via INTRAVENOUS
  Filled 2024-03-19: qty 17

## 2024-03-19 MED ORDER — ACETAMINOPHEN 325 MG PO TABS
650.0000 mg | ORAL_TABLET | Freq: Once | ORAL | Status: AC
  Administered 2024-03-19: 650 mg via ORAL
  Filled 2024-03-19: qty 2

## 2024-03-19 NOTE — Progress Notes (Signed)
 Diagnosis: Iron Deficiency Anemia  Provider:  Praveen Mannam MD  Procedure: IV Infusion  IV Type: Peripheral, IV Location: L Antecubital  Feraheme  (Ferumoxytol ), Dose: 510 mg  Infusion Start Time: 0857  Infusion Stop Time: 0915  Post Infusion IV Care: Observation period completed and Peripheral IV Discontinued  Discharge: Condition: Good, Destination: Home . AVS Provided  Performed by:  Maximiano JONELLE Pouch, LPN

## 2024-03-22 ENCOUNTER — Other Ambulatory Visit (HOSPITAL_COMMUNITY): Payer: Self-pay

## 2024-03-23 ENCOUNTER — Other Ambulatory Visit: Payer: Self-pay

## 2024-03-26 ENCOUNTER — Ambulatory Visit (INDEPENDENT_AMBULATORY_CARE_PROVIDER_SITE_OTHER)

## 2024-03-26 VITALS — BP 114/77 | HR 92 | Temp 97.9°F | Resp 16 | Ht 61.0 in | Wt 147.2 lb

## 2024-03-26 DIAGNOSIS — K922 Gastrointestinal hemorrhage, unspecified: Secondary | ICD-10-CM

## 2024-03-26 DIAGNOSIS — D500 Iron deficiency anemia secondary to blood loss (chronic): Secondary | ICD-10-CM | POA: Diagnosis not present

## 2024-03-26 MED ORDER — DIPHENHYDRAMINE HCL 25 MG PO CAPS
25.0000 mg | ORAL_CAPSULE | Freq: Once | ORAL | Status: AC
  Administered 2024-03-26: 25 mg via ORAL
  Filled 2024-03-26: qty 1

## 2024-03-26 MED ORDER — SODIUM CHLORIDE 0.9 % IV SOLN
510.0000 mg | Freq: Once | INTRAVENOUS | Status: AC
  Administered 2024-03-26: 510 mg via INTRAVENOUS
  Filled 2024-03-26: qty 17

## 2024-03-26 MED ORDER — ACETAMINOPHEN 325 MG PO TABS
650.0000 mg | ORAL_TABLET | Freq: Once | ORAL | Status: AC
  Administered 2024-03-26: 650 mg via ORAL
  Filled 2024-03-26: qty 2

## 2024-03-26 NOTE — Progress Notes (Signed)
 Diagnosis: Iron Deficiency Anemia  Provider:  Praveen Mannam MD  Procedure: IV Infusion  IV Type: Peripheral, IV Location: L Antecubital  Feraheme  (Ferumoxytol ), Dose: 510 mg  Infusion Start Time: 0853  Infusion Stop Time: 0910  Post Infusion IV Care: Observation period completed and Peripheral IV Discontinued  Discharge: Condition: Good, Destination: Home . AVS Provided  Performed by:  Maximiano JONELLE Pouch, LPN

## 2024-04-08 ENCOUNTER — Telehealth: Payer: Self-pay | Admitting: *Deleted

## 2024-04-08 ENCOUNTER — Inpatient Hospital Stay: Attending: Hematology

## 2024-04-08 DIAGNOSIS — D500 Iron deficiency anemia secondary to blood loss (chronic): Secondary | ICD-10-CM

## 2024-04-08 LAB — CBC WITH DIFFERENTIAL/PLATELET
Abs Immature Granulocytes: 0.07 K/uL (ref 0.00–0.07)
Basophils Absolute: 0 K/uL (ref 0.0–0.1)
Basophils Relative: 1 %
Eosinophils Absolute: 0.2 K/uL (ref 0.0–0.5)
Eosinophils Relative: 4 %
HCT: 37.2 % (ref 36.0–46.0)
Hemoglobin: 11.9 g/dL — ABNORMAL LOW (ref 12.0–15.0)
Immature Granulocytes: 1 %
Lymphocytes Relative: 25 %
Lymphs Abs: 1.4 K/uL (ref 0.7–4.0)
MCH: 27.7 pg (ref 26.0–34.0)
MCHC: 32 g/dL (ref 30.0–36.0)
MCV: 86.5 fL (ref 80.0–100.0)
Monocytes Absolute: 0.7 K/uL (ref 0.1–1.0)
Monocytes Relative: 13 %
Neutro Abs: 3.3 K/uL (ref 1.7–7.7)
Neutrophils Relative %: 56 %
Platelets: 284 K/uL (ref 150–400)
RBC: 4.3 MIL/uL (ref 3.87–5.11)
RDW: 20.8 % — ABNORMAL HIGH (ref 11.5–15.5)
WBC: 5.8 K/uL (ref 4.0–10.5)
nRBC: 0 % (ref 0.0–0.2)

## 2024-04-08 LAB — FERRITIN: Ferritin: 200 ng/mL (ref 11–307)

## 2024-04-08 NOTE — Telephone Encounter (Signed)
 RTC to patient no answer.  Will forward to C. Boone. Copied from CRM [id number]. Topic: Clinical - Order For Equipment >> Apr 07, 2024  5:02 PM Chiquita SQUIBB wrote: Reason for CRM: Patient called in after [facility] was closed and stated she was having issues with her wheelchair, provided her with the number for [employer] where she stated the wheelchair was through. Patient would like Glenda to call her back first thing in the morning and stated it was urgent with her power wheelchair.

## 2024-04-14 LAB — LAB REPORT - SCANNED
Albumin, Urine POC: 89.5
Creatinine, POC: 97.2 mg/dL
EGFR: 24
Microalb Creat Ratio: 92

## 2024-04-26 ENCOUNTER — Other Ambulatory Visit (HOSPITAL_COMMUNITY): Payer: Self-pay

## 2024-04-27 ENCOUNTER — Other Ambulatory Visit: Payer: Self-pay

## 2024-04-28 ENCOUNTER — Other Ambulatory Visit: Payer: Self-pay

## 2024-04-28 ENCOUNTER — Encounter: Payer: Self-pay | Admitting: Pharmacist

## 2024-04-28 ENCOUNTER — Other Ambulatory Visit (HOSPITAL_COMMUNITY): Payer: Self-pay

## 2024-04-28 MED ORDER — DOXYCYCLINE MONOHYDRATE 100 MG PO CAPS
100.0000 mg | ORAL_CAPSULE | Freq: Two times a day (BID) | ORAL | 0 refills | Status: AC
  Filled 2024-04-28: qty 20, 10d supply, fill #0

## 2024-05-07 ENCOUNTER — Inpatient Hospital Stay: Attending: Hematology

## 2024-05-07 ENCOUNTER — Inpatient Hospital Stay

## 2024-05-07 DIAGNOSIS — D500 Iron deficiency anemia secondary to blood loss (chronic): Secondary | ICD-10-CM

## 2024-05-07 LAB — CBC WITH DIFFERENTIAL/PLATELET
Abs Immature Granulocytes: 0.12 K/uL — ABNORMAL HIGH (ref 0.00–0.07)
Basophils Absolute: 0.1 K/uL (ref 0.0–0.1)
Basophils Relative: 1 %
Eosinophils Absolute: 0.2 K/uL (ref 0.0–0.5)
Eosinophils Relative: 2 %
HCT: 38.6 % (ref 36.0–46.0)
Hemoglobin: 12.1 g/dL (ref 12.0–15.0)
Immature Granulocytes: 1 %
Lymphocytes Relative: 16 %
Lymphs Abs: 1.3 K/uL (ref 0.7–4.0)
MCH: 28.2 pg (ref 26.0–34.0)
MCHC: 31.3 g/dL (ref 30.0–36.0)
MCV: 90 fL (ref 80.0–100.0)
Monocytes Absolute: 0.9 K/uL (ref 0.1–1.0)
Monocytes Relative: 10 %
Neutro Abs: 5.8 K/uL (ref 1.7–7.7)
Neutrophils Relative %: 70 %
Platelets: 360 K/uL (ref 150–400)
RBC: 4.29 MIL/uL (ref 3.87–5.11)
RDW: 19.1 % — ABNORMAL HIGH (ref 11.5–15.5)
WBC: 8.3 K/uL (ref 4.0–10.5)
nRBC: 0 % (ref 0.0–0.2)

## 2024-05-10 ENCOUNTER — Other Ambulatory Visit: Payer: Self-pay

## 2024-05-24 ENCOUNTER — Other Ambulatory Visit: Payer: Self-pay | Admitting: Student

## 2024-05-25 ENCOUNTER — Other Ambulatory Visit (HOSPITAL_COMMUNITY): Payer: Self-pay

## 2024-05-25 ENCOUNTER — Other Ambulatory Visit: Payer: Self-pay

## 2024-05-25 MED ORDER — ALLOPURINOL 100 MG PO TABS
50.0000 mg | ORAL_TABLET | Freq: Every day | ORAL | 2 refills | Status: AC
  Filled 2024-05-25: qty 30, 60d supply, fill #0

## 2024-05-25 NOTE — Telephone Encounter (Signed)
 Medication sent to pharmacy

## 2024-05-27 ENCOUNTER — Other Ambulatory Visit: Payer: Self-pay

## 2024-06-01 ENCOUNTER — Other Ambulatory Visit (HOSPITAL_COMMUNITY): Payer: Self-pay

## 2024-06-01 ENCOUNTER — Ambulatory Visit (INDEPENDENT_AMBULATORY_CARE_PROVIDER_SITE_OTHER): Payer: Self-pay | Admitting: Student

## 2024-06-01 ENCOUNTER — Other Ambulatory Visit: Payer: Self-pay

## 2024-06-01 ENCOUNTER — Encounter: Payer: Self-pay | Admitting: Student

## 2024-06-01 VITALS — BP 138/70 | HR 82 | Temp 98.2°F | Ht 61.0 in | Wt 150.4 lb

## 2024-06-01 DIAGNOSIS — G9331 Postviral fatigue syndrome: Secondary | ICD-10-CM

## 2024-06-01 DIAGNOSIS — E559 Vitamin D deficiency, unspecified: Secondary | ICD-10-CM

## 2024-06-01 DIAGNOSIS — F32A Depression, unspecified: Secondary | ICD-10-CM

## 2024-06-01 DIAGNOSIS — E1122 Type 2 diabetes mellitus with diabetic chronic kidney disease: Secondary | ICD-10-CM

## 2024-06-01 DIAGNOSIS — I10 Essential (primary) hypertension: Secondary | ICD-10-CM

## 2024-06-01 DIAGNOSIS — N189 Chronic kidney disease, unspecified: Secondary | ICD-10-CM

## 2024-06-01 DIAGNOSIS — Z7985 Long-term (current) use of injectable non-insulin antidiabetic drugs: Secondary | ICD-10-CM | POA: Diagnosis not present

## 2024-06-01 DIAGNOSIS — R053 Chronic cough: Secondary | ICD-10-CM | POA: Diagnosis not present

## 2024-06-01 DIAGNOSIS — I129 Hypertensive chronic kidney disease with stage 1 through stage 4 chronic kidney disease, or unspecified chronic kidney disease: Secondary | ICD-10-CM | POA: Diagnosis not present

## 2024-06-01 DIAGNOSIS — Z7984 Long term (current) use of oral hypoglycemic drugs: Secondary | ICD-10-CM | POA: Diagnosis not present

## 2024-06-01 DIAGNOSIS — R21 Rash and other nonspecific skin eruption: Secondary | ICD-10-CM

## 2024-06-01 DIAGNOSIS — K219 Gastro-esophageal reflux disease without esophagitis: Secondary | ICD-10-CM | POA: Diagnosis not present

## 2024-06-01 DIAGNOSIS — M1711 Unilateral primary osteoarthritis, right knee: Secondary | ICD-10-CM

## 2024-06-01 DIAGNOSIS — F321 Major depressive disorder, single episode, moderate: Secondary | ICD-10-CM

## 2024-06-01 DIAGNOSIS — F329 Major depressive disorder, single episode, unspecified: Secondary | ICD-10-CM

## 2024-06-01 DIAGNOSIS — E119 Type 2 diabetes mellitus without complications: Secondary | ICD-10-CM

## 2024-06-01 LAB — POCT GLYCOSYLATED HEMOGLOBIN (HGB A1C): HbA1c, POC (controlled diabetic range): 5 % (ref 0.0–7.0)

## 2024-06-01 LAB — GLUCOSE, CAPILLARY: Glucose-Capillary: 77 mg/dL (ref 70–99)

## 2024-06-01 MED ORDER — BENZONATATE 100 MG PO CAPS
100.0000 mg | ORAL_CAPSULE | Freq: Three times a day (TID) | ORAL | 0 refills | Status: DC | PRN
  Filled 2024-06-01: qty 42, 14d supply, fill #0

## 2024-06-01 MED ORDER — DAPAGLIFLOZIN PROPANEDIOL 10 MG PO TABS
10.0000 mg | ORAL_TABLET | Freq: Every day | ORAL | 11 refills | Status: DC
  Filled 2024-06-01: qty 30, 30d supply, fill #0

## 2024-06-01 MED ORDER — GABAPENTIN 400 MG PO CAPS
ORAL_CAPSULE | ORAL | 3 refills | Status: DC
  Filled 2024-06-01: qty 360, 90d supply, fill #0

## 2024-06-01 MED ORDER — DAPAGLIFLOZIN PROPANEDIOL 10 MG PO TABS
10.0000 mg | ORAL_TABLET | Freq: Every day | ORAL | 11 refills | Status: AC
  Filled 2024-06-01 – 2024-06-22 (×2): qty 30, 30d supply, fill #0

## 2024-06-01 MED ORDER — DICLOFENAC SODIUM 1 % EX GEL
4.0000 g | Freq: Four times a day (QID) | CUTANEOUS | 3 refills | Status: DC
  Filled 2024-06-01: qty 100, fill #0

## 2024-06-01 MED ORDER — PANTOPRAZOLE SODIUM 40 MG PO TBEC
40.0000 mg | DELAYED_RELEASE_TABLET | Freq: Every day | ORAL | 3 refills | Status: DC
  Filled 2024-06-01: qty 90, 90d supply, fill #0

## 2024-06-01 MED ORDER — TRULICITY 0.75 MG/0.5ML ~~LOC~~ SOAJ
0.7500 mg | SUBCUTANEOUS | 3 refills | Status: AC
  Filled 2024-06-01: qty 6, 84d supply, fill #0

## 2024-06-01 MED ORDER — ESCITALOPRAM OXALATE 10 MG PO TABS
10.0000 mg | ORAL_TABLET | Freq: Every day | ORAL | 2 refills | Status: DC
  Filled 2024-06-01: qty 30, 30d supply, fill #0

## 2024-06-01 MED ORDER — LOSARTAN POTASSIUM 100 MG PO TABS
100.0000 mg | ORAL_TABLET | Freq: Every day | ORAL | 1 refills | Status: DC
  Filled 2024-06-01: qty 90, 90d supply, fill #0

## 2024-06-01 MED ORDER — PANTOPRAZOLE SODIUM 40 MG PO TBEC
40.0000 mg | DELAYED_RELEASE_TABLET | Freq: Every day | ORAL | 3 refills | Status: AC
  Filled 2024-06-01: qty 90, 90d supply, fill #0

## 2024-06-01 MED ORDER — TERBINAFINE HCL 1 % EX CREA
1.0000 | TOPICAL_CREAM | Freq: Two times a day (BID) | CUTANEOUS | 0 refills | Status: DC
  Filled 2024-06-01: qty 30, 15d supply, fill #0

## 2024-06-01 MED ORDER — GABAPENTIN 400 MG PO CAPS
ORAL_CAPSULE | ORAL | 3 refills | Status: AC
  Filled 2024-06-01 – 2024-06-04 (×2): qty 360, 90d supply, fill #0

## 2024-06-01 MED ORDER — DICLOFENAC SODIUM 1 % EX GEL
4.0000 g | Freq: Four times a day (QID) | CUTANEOUS | 3 refills | Status: AC
  Filled 2024-06-01: qty 100, 7d supply, fill #0
  Filled 2024-06-05: qty 100, 30d supply, fill #0
  Filled 2024-06-11: qty 100, 7d supply, fill #0
  Filled 2024-06-17: qty 100, fill #0
  Filled 2024-06-18: qty 100, 7d supply, fill #0
  Filled 2024-06-24: qty 100, 6d supply, fill #0
  Filled 2024-07-01: qty 100, 7d supply, fill #0

## 2024-06-01 MED ORDER — TRULICITY 0.75 MG/0.5ML ~~LOC~~ SOAJ
0.7500 mg | SUBCUTANEOUS | 3 refills | Status: DC
  Filled 2024-06-01: qty 6, 84d supply, fill #0

## 2024-06-01 MED ORDER — LOSARTAN POTASSIUM 100 MG PO TABS
100.0000 mg | ORAL_TABLET | Freq: Every day | ORAL | 1 refills | Status: AC
  Filled 2024-06-01: qty 90, 90d supply, fill #0

## 2024-06-01 MED ORDER — BENZONATATE 100 MG PO CAPS
100.0000 mg | ORAL_CAPSULE | Freq: Three times a day (TID) | ORAL | 0 refills | Status: AC | PRN
  Filled 2024-06-01: qty 42, 14d supply, fill #0

## 2024-06-01 MED ORDER — ESCITALOPRAM OXALATE 10 MG PO TABS
10.0000 mg | ORAL_TABLET | Freq: Every day | ORAL | 2 refills | Status: AC
  Filled 2024-06-01 – 2024-06-17 (×4): qty 30, 30d supply, fill #0

## 2024-06-01 MED ORDER — TERBINAFINE HCL 1 % EX CREA
1.0000 | TOPICAL_CREAM | Freq: Two times a day (BID) | CUTANEOUS | 0 refills | Status: AC
  Filled 2024-06-01: qty 30, 30d supply, fill #0

## 2024-06-01 NOTE — Assessment & Plan Note (Signed)
 She has a longstanding history of depression but is not currently on any pharmacotherapy.  PHQ in office is 18, she is tearful, she is sleeping and eating poorly, she does not have mania, she is not a danger to herself or others.  But she certainly needs treatment. - Start escitalopram  10 daily, counseled it will take about 4 weeks to have an effect - Check vitamin D  levels - Refer to behavior talk therapy with the - RTC in 4 to 6 weeks to follow-up on this issue

## 2024-06-01 NOTE — Assessment & Plan Note (Signed)
 This could be contributing to her chronic cough. Continue pantoprazole  40 daily.

## 2024-06-01 NOTE — Progress Notes (Addendum)
 "  CC: General checkup and depressive symptoms  HPI:  [name] is a 74 y.o. female with a PMH stated below who presents today for evaluation.  Please see problem based assessment and plan for additional details.  Past Medical History:  Diagnosis Date   Allergic rhinitis    Anemia, iron deficiency 05/03/2011   2/2 GI AVMs - recieves iron infusions as well as periodic red blood cell transfusions   Anxiety    Arteriovenous malformation of duodenum 02/25/2015   CARPAL TUNNEL RELEASE, HX OF 07/22/2008   Chest pain 10/28/2019   Colon polyps    Diabetes mellitus without complication (HCC)    type 2   Difficulty sleeping    takes trazadone for sleep   Diverticulosis    Electrolyte abnormality 01/25/2021   Fall 01/03/2020   GERD (gastroesophageal reflux disease)    GI bleed 07/07/2020   Hepatitis C    in SVR with Harvoni  12/2014-02/2015.    History of hernia repair 08/18/2012   History of porphyria 11/30/2014   Porphyria cutanea tarda in setting of chronic Hepatitis C, frequent  IV iron infusions, and alcohol abuse    Hypertension    Hypocalcemia 10/28/2019   Hypocalcemia 01/19/2021   Hypokalemia 09/27/2014   Hypokalemia 01/19/2021   Hypomagnesemia 01/19/2021   Kidney disease 02/04/2019   Peripheral vascular disease    Prolonged QT interval 01/19/2021   Stroke (HCC) 2010   no residual deficits   Substance use disorder    cocaine   Tachycardia 07/10/2017   Trichomonas vaginitis 04/16/2019   Review of Systems: ROS negative except for what is noted on the assessment and plan.  Vitals:   06/01/24 0810  BP: 138/70  Pulse: 82  Temp: 98.2 F (36.8 C)  TempSrc: Oral  SpO2: 93%  Weight: 150 lb 6.4 oz (68.2 kg)  Height: 5' 1 (1.549 m)   Physical Exam: Constitutional: well-appearing but tearful woman in wheelchair in no acute distress Cardiovascular: regular rate and rhythm, no m/r/g Pulmonary/Chest: normal work of breathing on room air, lungs clear to auscultation  bilaterally Abdominal: soft, non-tender, non-distended MSK: normal bulk and tone Neurological: alert & oriented x 3, no focal deficit Skin: warm and dry Psych: cries often and easily, but consolable  Assessment & Plan:   Patient discussed with Dr. Lovie  Major depressive disorder She has a longstanding history of depression but is not currently on any pharmacotherapy.  PHQ in office is 18, she is tearful, she is sleeping and eating poorly, she does not have mania, she is not a danger to herself or others.  But she certainly needs treatment. - Start escitalopram  10 daily, counseled it will take about 4 weeks to have an effect - Check vitamin D  levels - Refer to behavior talk therapy with the - RTC in 4 to 6 weeks to follow-up on this issue  Vitamin D  deficiency This was assessed last year, but her supplementation is irregular. Given worsening depression, I will recheck vit D today.  Type 2 diabetes mellitus with chronic kidney disease (HCC) Refill trulicity  0.75mg /week and farxiga  10 daily. A1c today is 5.  GERD This could be contributing to her chronic cough. Continue pantoprazole  40 daily.  Essential hypertension, benign At goal. 138/70, reasonable at her age. Continue amlodipine  10 and losartan  100 daily.  Cough Lingering cough x 2-3 weeks. No fevers, chills, sick contacts, lung clear, no dyspnea. Do not suspect pneumonia. Suspect post-viral syndrome vs GERD. In addition to her pantoprazole  above, will  provide a course of tessalon .  RTC in 4-6 weeks for follow-up of depression   ADD 1/8: Vitamin D  very low and I will begin supplementation with 50k units weekly for 8 weeks. She reports that currently she takes 2000u per day. On phone, she shared that she is unable to afford most medicines. They tend to be $6-10 each but she has many on board. I will reach out to pharmacy RX team for solutions. If unable, she may benefit from reducing medicines as able at next  visit.    Lonni Africa, D.O. [facility] Internal Medicine, PGY-2 Phone: [phone] Date 06/01/2024 Time 11:22 AM "

## 2024-06-01 NOTE — Assessment & Plan Note (Signed)
 Refill trulicity  0.75mg /week and farxiga  10 daily. A1c today is 5.

## 2024-06-01 NOTE — Assessment & Plan Note (Signed)
 This was assessed last year, but her supplementation is irregular. Given worsening depression, I will recheck vit D today.

## 2024-06-01 NOTE — Patient Instructions (Addendum)
 Lexapro  is a medicine called an antidepressant. It takes about 1 month to work, but after that you should notice that it improves your mood and helps you get through your tasks. It may cause some mild stomach upset at first, but that should pass after about 1 week.  Voltaren  Gel - rub on the knee as needed for pain  Tessalon  - take three times daily as needed for cough  Please return for a follow-up in 4-8 weeks so we can see how you are doing with the new medicine.

## 2024-06-01 NOTE — Assessment & Plan Note (Signed)
 At goal. 138/70, reasonable at her age. Continue amlodipine  10 and losartan  100 daily.

## 2024-06-02 ENCOUNTER — Other Ambulatory Visit (HOSPITAL_COMMUNITY): Payer: Self-pay

## 2024-06-02 ENCOUNTER — Other Ambulatory Visit: Payer: Self-pay

## 2024-06-02 ENCOUNTER — Encounter: Payer: Self-pay | Admitting: Pharmacist

## 2024-06-02 LAB — VITAMIN D 25 HYDROXY (VIT D DEFICIENCY, FRACTURES): Vit D, 25-Hydroxy: 8.8 ng/mL — ABNORMAL LOW (ref 30.0–100.0)

## 2024-06-02 NOTE — Progress Notes (Signed)
 Internal Medicine Clinic Attending  Case discussed with the resident at the time of the visit.  We reviewed the residents history and exam and pertinent patient test results.  I agree with the assessment, diagnosis, and plan of care documented in the residents note.   Will replete Vitamin D  levels

## 2024-06-04 ENCOUNTER — Telehealth: Payer: Self-pay | Admitting: Student

## 2024-06-04 ENCOUNTER — Ambulatory Visit: Payer: Self-pay | Admitting: Student

## 2024-06-04 ENCOUNTER — Other Ambulatory Visit: Payer: Self-pay

## 2024-06-04 ENCOUNTER — Other Ambulatory Visit: Payer: Self-pay | Admitting: Student

## 2024-06-04 ENCOUNTER — Other Ambulatory Visit (HOSPITAL_COMMUNITY): Payer: Self-pay

## 2024-06-04 DIAGNOSIS — F32A Depression, unspecified: Secondary | ICD-10-CM

## 2024-06-04 DIAGNOSIS — E559 Vitamin D deficiency, unspecified: Secondary | ICD-10-CM

## 2024-06-04 MED ORDER — VITAMIN D (ERGOCALCIFEROL) 1.25 MG (50000 UNIT) PO CAPS
50000.0000 [IU] | ORAL_CAPSULE | ORAL | 0 refills | Status: AC
  Filled 2024-06-04: qty 4, 28d supply, fill #0
  Filled 2024-06-25: qty 4, 28d supply, fill #1

## 2024-06-04 NOTE — Addendum Note (Signed)
 Addended by: HARRIE BRUCKNER on: 06/04/2024 12:02 PM   Modules accepted: Orders

## 2024-06-04 NOTE — Telephone Encounter (Signed)
 Pt stated she picked up Vit D and Gabapentin  and Dr Harrie  was going to find out cost of her other meds - Voltaren  gel, Tessalon  perles, and Lexapro  - she was calling for an update.

## 2024-06-04 NOTE — Telephone Encounter (Signed)
 Copied from CRM [id number]. Topic: Clinical - Refused Triage >> Jun 04, 2024 10:13 AM Mercer PEDLAR wrote: Patient/caller voiced complaints of being in a lot of pain, did not specify what area of her body is in pain. Declined transfer to triage. She is requesting to speak with DO Cheistopher Juberg.

## 2024-06-04 NOTE — Telephone Encounter (Signed)
 Message has been forwarded to triage nurse.  Copied from CRM #[id number]. Topic: Clinical - Medication Question >> Jun 04, 2024 12:53 PM [name] wrote: Reason for CRM: patient calling to let the doctor know she picked up vitamin D  today, the patient is also asking about medication at cost at the pharmacy and she would like an update about the medication cost   Pt num  [phone]

## 2024-06-04 NOTE — Telephone Encounter (Signed)
 Return pt's call - no answer; phone continued to ring. No vm; unable to leave a message.

## 2024-06-05 ENCOUNTER — Other Ambulatory Visit: Payer: Self-pay

## 2024-06-05 ENCOUNTER — Other Ambulatory Visit (HOSPITAL_COMMUNITY): Payer: Self-pay

## 2024-06-08 ENCOUNTER — Other Ambulatory Visit (HOSPITAL_COMMUNITY): Payer: Self-pay

## 2024-06-08 ENCOUNTER — Inpatient Hospital Stay: Attending: Hematology

## 2024-06-08 ENCOUNTER — Telehealth: Payer: Self-pay

## 2024-06-08 DIAGNOSIS — D500 Iron deficiency anemia secondary to blood loss (chronic): Secondary | ICD-10-CM

## 2024-06-08 LAB — CBC WITH DIFFERENTIAL/PLATELET
Abs Immature Granulocytes: 0.07 K/uL (ref 0.00–0.07)
Basophils Absolute: 0 K/uL (ref 0.0–0.1)
Basophils Relative: 1 %
Eosinophils Absolute: 0.2 K/uL (ref 0.0–0.5)
Eosinophils Relative: 2 %
HCT: 33.1 % — ABNORMAL LOW (ref 36.0–46.0)
Hemoglobin: 10.6 g/dL — ABNORMAL LOW (ref 12.0–15.0)
Immature Granulocytes: 1 %
Lymphocytes Relative: 23 %
Lymphs Abs: 1.6 K/uL (ref 0.7–4.0)
MCH: 28.1 pg (ref 26.0–34.0)
MCHC: 32 g/dL (ref 30.0–36.0)
MCV: 87.8 fL (ref 80.0–100.0)
Monocytes Absolute: 0.9 K/uL (ref 0.1–1.0)
Monocytes Relative: 13 %
Neutro Abs: 4.1 K/uL (ref 1.7–7.7)
Neutrophils Relative %: 60 %
Platelets: 288 K/uL (ref 150–400)
RBC: 3.77 MIL/uL — ABNORMAL LOW (ref 3.87–5.11)
RDW: 16.3 % — ABNORMAL HIGH (ref 11.5–15.5)
WBC: 6.8 K/uL (ref 4.0–10.5)
nRBC: 0 % (ref 0.0–0.2)

## 2024-06-08 NOTE — Telephone Encounter (Signed)
-----   Message from Lonni Africa sent at 06/04/2024 11:06 AM EST ----- Regarding: Affordability options Hello, This patient is not picking up most medicines as they are too expensive. Are there any programs/vouchers/etc that are available to her? Dr JINNY

## 2024-06-08 NOTE — Telephone Encounter (Signed)
 Attempted to call patient regarding medication costs.   Inquiring where patient is picking up meds.   Can use our outpatient pharmacies to pickup meds and ask about the AR Account if she is unable to afford copays (would like to figure out which meds first before this option).   Will also need to look into Medicare Prescription Payment Plan or LIS/ Extra Help with Social Security.

## 2024-06-10 ENCOUNTER — Other Ambulatory Visit: Payer: Self-pay

## 2024-06-11 ENCOUNTER — Other Ambulatory Visit: Payer: Self-pay

## 2024-06-11 ENCOUNTER — Other Ambulatory Visit (HOSPITAL_COMMUNITY): Payer: Self-pay

## 2024-06-16 ENCOUNTER — Other Ambulatory Visit: Payer: Self-pay

## 2024-06-17 ENCOUNTER — Other Ambulatory Visit: Payer: Self-pay

## 2024-06-17 ENCOUNTER — Other Ambulatory Visit (HOSPITAL_COMMUNITY): Payer: Self-pay

## 2024-06-18 ENCOUNTER — Other Ambulatory Visit: Payer: Self-pay

## 2024-06-19 ENCOUNTER — Encounter: Payer: Self-pay | Admitting: Pharmacist

## 2024-06-19 ENCOUNTER — Other Ambulatory Visit: Payer: Self-pay

## 2024-06-19 NOTE — Telephone Encounter (Signed)
 Called and spoke with the patient about her PWC repairs needed. Pt to have a Technician to come out and will keep in touch with [facility] in regards to when they will be  coming out to service her broken PWC.   Copied from CRM #[id number]. Topic: General - Other >> Jun 18, 2024  1:27 PM DeAngela L wrote: Reason for CRM: patient called and asked to speak with Chalon in the office informed patient the office is at lunch and will give a call back when they return   Patient num  [phone]

## 2024-06-22 ENCOUNTER — Other Ambulatory Visit (HOSPITAL_COMMUNITY): Payer: Self-pay

## 2024-06-22 ENCOUNTER — Other Ambulatory Visit: Payer: Self-pay

## 2024-06-23 ENCOUNTER — Other Ambulatory Visit: Payer: Self-pay

## 2024-06-24 ENCOUNTER — Other Ambulatory Visit: Payer: Self-pay

## 2024-06-24 ENCOUNTER — Other Ambulatory Visit (HOSPITAL_COMMUNITY): Payer: Self-pay

## 2024-06-25 ENCOUNTER — Other Ambulatory Visit: Payer: Self-pay

## 2024-06-30 ENCOUNTER — Other Ambulatory Visit: Payer: Self-pay

## 2024-07-01 ENCOUNTER — Other Ambulatory Visit: Payer: Self-pay

## 2024-07-01 ENCOUNTER — Ambulatory Visit: Payer: Self-pay | Admitting: Licensed Clinical Social Worker

## 2024-07-01 DIAGNOSIS — F339 Major depressive disorder, recurrent, unspecified: Secondary | ICD-10-CM

## 2024-07-01 NOTE — BH Specialist Note (Signed)
 Therapist attempted patient via phone twice. Phone continuously rings. No Vm is set up. Unable to leave a VM. This call was regarding a behavioral health consultation scheduled for today at 2 pm via telehealth.   Renda Pontes, MSW, LCSW-A She/Her Behavioral Health Clinician [facility]  Internal Medicine Center Direct Dial:[phone]  Fax [phone] Main Office Phone: [phone] [address] [place], [place] [address] Website: [facility] Internal Medicine [facility]  [place], [place]  [place]

## 2024-07-02 ENCOUNTER — Other Ambulatory Visit (HOSPITAL_COMMUNITY): Payer: Self-pay

## 2024-07-09 ENCOUNTER — Inpatient Hospital Stay: Admitting: Hematology

## 2024-07-09 ENCOUNTER — Inpatient Hospital Stay: Attending: Hematology

## 2024-07-20 ENCOUNTER — Ambulatory Visit: Payer: Self-pay | Admitting: Student

## 2024-09-30 ENCOUNTER — Ambulatory Visit: Admitting: Dermatology
